# Patient Record
Sex: Female | Born: 1955
Health system: Southern US, Community
[De-identification: ages and names within clinical notes are randomized; demographics above are authoritative.]

## PROBLEM LIST (undated history)

## (undated) DIAGNOSIS — D649 Anemia, unspecified: Secondary | ICD-10-CM

## (undated) DIAGNOSIS — Z9889 Other specified postprocedural states: Secondary | ICD-10-CM

## (undated) DIAGNOSIS — F1021 Alcohol dependence, in remission: Secondary | ICD-10-CM

## (undated) DIAGNOSIS — F32A Depression, unspecified: Secondary | ICD-10-CM

## (undated) DIAGNOSIS — F909 Attention-deficit hyperactivity disorder, unspecified type: Secondary | ICD-10-CM

## (undated) DIAGNOSIS — F419 Anxiety disorder, unspecified: Secondary | ICD-10-CM

## (undated) DIAGNOSIS — R011 Cardiac murmur, unspecified: Secondary | ICD-10-CM

## (undated) DIAGNOSIS — R112 Nausea with vomiting, unspecified: Secondary | ICD-10-CM

## (undated) DIAGNOSIS — M1990 Unspecified osteoarthritis, unspecified site: Secondary | ICD-10-CM

## (undated) DIAGNOSIS — F329 Major depressive disorder, single episode, unspecified: Secondary | ICD-10-CM

## (undated) HISTORY — PX: CHOLECYSTECTOMY: SHX55

---

## 2002-07-29 ENCOUNTER — Ambulatory Visit (HOSPITAL_COMMUNITY): Admission: RE | Admit: 2002-07-29 | Discharge: 2002-07-29 | Payer: Self-pay | Admitting: Internal Medicine

## 2002-07-29 ENCOUNTER — Encounter: Payer: Self-pay | Admitting: Internal Medicine

## 2008-07-26 ENCOUNTER — Emergency Department (HOSPITAL_COMMUNITY): Admission: EM | Admit: 2008-07-26 | Discharge: 2008-07-26 | Payer: Self-pay | Admitting: Family Medicine

## 2008-09-23 ENCOUNTER — Other Ambulatory Visit: Admission: RE | Admit: 2008-09-23 | Discharge: 2008-09-23 | Payer: Self-pay | Admitting: Family Medicine

## 2010-02-02 ENCOUNTER — Emergency Department (HOSPITAL_COMMUNITY): Admission: EM | Admit: 2010-02-02 | Discharge: 2010-02-02 | Payer: Self-pay | Admitting: Emergency Medicine

## 2010-02-17 ENCOUNTER — Emergency Department (HOSPITAL_COMMUNITY): Admission: EM | Admit: 2010-02-17 | Discharge: 2010-02-17 | Payer: Self-pay | Admitting: Emergency Medicine

## 2012-10-23 ENCOUNTER — Emergency Department (HOSPITAL_COMMUNITY): Payer: 59

## 2012-10-23 ENCOUNTER — Encounter (HOSPITAL_COMMUNITY): Payer: Self-pay | Admitting: Emergency Medicine

## 2012-10-23 ENCOUNTER — Inpatient Hospital Stay (HOSPITAL_COMMUNITY)
Admission: EM | Admit: 2012-10-23 | Discharge: 2012-10-26 | DRG: 812 | Disposition: A | Discharge status: Home / Self Care | Payer: 59 | Attending: Internal Medicine | Admitting: Internal Medicine

## 2012-10-23 DIAGNOSIS — D509 Iron deficiency anemia, unspecified: Principal | ICD-10-CM

## 2012-10-23 DIAGNOSIS — F329 Major depressive disorder, single episode, unspecified: Secondary | ICD-10-CM

## 2012-10-23 DIAGNOSIS — R600 Localized edema: Secondary | ICD-10-CM

## 2012-10-23 DIAGNOSIS — F1020 Alcohol dependence, uncomplicated: Secondary | ICD-10-CM

## 2012-10-23 DIAGNOSIS — Z720 Tobacco use: Secondary | ICD-10-CM

## 2012-10-23 DIAGNOSIS — D62 Acute posthemorrhagic anemia: Secondary | ICD-10-CM

## 2012-10-23 DIAGNOSIS — D649 Anemia, unspecified: Secondary | ICD-10-CM

## 2012-10-23 DIAGNOSIS — F988 Other specified behavioral and emotional disorders with onset usually occurring in childhood and adolescence: Secondary | ICD-10-CM

## 2012-10-23 DIAGNOSIS — F3289 Other specified depressive episodes: Secondary | ICD-10-CM

## 2012-10-23 DIAGNOSIS — F17200 Nicotine dependence, unspecified, uncomplicated: Secondary | ICD-10-CM | POA: Diagnosis present

## 2012-10-23 DIAGNOSIS — F32A Depression, unspecified: Secondary | ICD-10-CM

## 2012-10-23 HISTORY — DX: Other specified postprocedural states: Z98.890

## 2012-10-23 HISTORY — DX: Other specified postprocedural states: R11.2

## 2012-10-23 HISTORY — DX: Depression, unspecified: F32.A

## 2012-10-23 HISTORY — DX: Major depressive disorder, single episode, unspecified: F32.9

## 2012-10-23 HISTORY — DX: Anemia, unspecified: D64.9

## 2012-10-23 HISTORY — DX: Cardiac murmur, unspecified: R01.1

## 2012-10-23 LAB — CBC WITH DIFFERENTIAL/PLATELET
Basophils Relative: 1 % (ref 0–1)
Eosinophils Absolute: 0 10*3/uL (ref 0.0–0.7)
Eosinophils Relative: 1 % (ref 0–5)
HCT: 19.1 % — ABNORMAL LOW (ref 36.0–46.0)
Hemoglobin: 4.8 g/dL — CL (ref 12.0–15.0)
Lymphocytes Relative: 30 % (ref 12–46)
MCHC: 25.1 g/dL — ABNORMAL LOW (ref 30.0–36.0)
Neutro Abs: 2.5 10*3/uL (ref 1.7–7.7)

## 2012-10-23 LAB — COMPREHENSIVE METABOLIC PANEL
ALT: 13 U/L (ref 0–35)
Alkaline Phosphatase: 50 U/L (ref 39–117)
CO2: 23 mEq/L (ref 19–32)
Calcium: 9.3 mg/dL (ref 8.4–10.5)
Chloride: 104 mEq/L (ref 96–112)
GFR calc Af Amer: >90 mL/min (ref >90)
GFR calc non Af Amer: 81 mL/min — ABNORMAL LOW (ref >90)
Glucose, Bld: 112 mg/dL — ABNORMAL HIGH (ref 70–99)
Potassium: 3.7 mEq/L (ref 3.5–5.1)
Sodium: 139 mEq/L (ref 135–145)
Total Bilirubin: 0.4 mg/dL (ref 0.3–1.2)

## 2012-10-23 LAB — PROTIME-INR
INR: 1.25 (ref 0.00–1.49)
Prothrombin Time: 15.5 seconds — ABNORMAL HIGH (ref 11.6–15.2)

## 2012-10-23 LAB — POCT I-STAT TROPONIN I: Troponin i, poc: 0.01 ng/mL (ref 0.00–0.08)

## 2012-10-23 MED ORDER — DIPHENHYDRAMINE HCL 50 MG/ML IJ SOLN
25.0000 mg | Freq: Once | INTRAMUSCULAR | Status: AC
  Administered 2012-10-23: 25 mg via INTRAVENOUS
  Filled 2012-10-23: qty 1

## 2012-10-23 MED ORDER — PANTOPRAZOLE SODIUM 40 MG IV SOLR
40.0000 mg | Freq: Once | INTRAVENOUS | Status: AC
  Administered 2012-10-23: 40 mg via INTRAVENOUS
  Filled 2012-10-23: qty 40

## 2012-10-23 MED ORDER — SODIUM CHLORIDE 0.9 % IV BOLUS (SEPSIS)
1000.0000 mL | Freq: Once | INTRAVENOUS | Status: AC
  Administered 2012-10-23: 1000 mL via INTRAVENOUS

## 2012-10-23 NOTE — ED Notes (Signed)
Pt saw PCP on Monday for fatigue, SOB, and chest pressure x several weeks; pt to have cath and had pre op blood work done and called to come here for hgb 5; pt sts SOB with exertion and fatigue with chest pressure and swelling in hands and feet

## 2012-10-23 NOTE — ED Notes (Signed)
Consent to receive blood signed by patient, RN, and MD @ [phone].

## 2012-10-23 NOTE — H&P (Addendum)
Laura Chang is an 56 y.o. female.  Patient was seen and examined on 10/23/2012. PCP - [facility]. Chief Complaint: Low hemoglobin. HPI: 56 year-old female with history of depression and ADD who drinks wine every day has been experiencing shortness of breath fatigue over the last few weeks. Patient also been having exertional chest tightness. Patient was referred to cardiologist and cardiologist was planning procedure and had preliminary blood work done and showed severe anemia and was referred to the ER. In the ER repeat blood work confirmed patient's hemoglobin to be around 5 and at this time has been admitted for further workup. Patient denies any blood in the stool or black stools denies any menorrhagia as patient has had menopause 4 years ago. Patient occasionally uses NSAIDs last use was last week. Stool for occult blood was negative as and but ER physician and stool was brown not melanotic. Patient otherwise is hemodynamically stable.  Past Medical History  Diagnosis Date  . Depression     Past Surgical History  Procedure Date  . Cholecystectomy     Family History  Problem Relation Age of Onset  . Atrial fibrillation Mother   . CAD Father    Social History:  reports that she has been smoking.  She does not have any smokeless tobacco history on file. She reports that she drinks alcohol. She reports that she does not use illicit drugs.  Allergies: No Known Allergies   (Not in a hospital admission)  Results for orders placed during the hospital encounter of 10/23/12 (from the past 48 hour(s))  CBC WITH DIFFERENTIAL     Status: Abnormal   Collection Time   10/23/12  4:59 PM      Component Value Range Comment   WBC 4.3  4.0 - 10.5 K/uL    RBC 3.05 (*) 3.87 - 5.11 MIL/uL    Hemoglobin 4.8 (*) 12.0 - 15.0 g/dL    HCT 16.1 (*) 09.6 - 46.0 %    MCV 62.6 (*) 78.0 - 100.0 fL    MCH 15.7 (*) 26.0 - 34.0 pg    MCHC 25.1 (*) 30.0 - 36.0 g/dL    RDW 04.5 (*) 40.9 - 15.5  %    Platelets 411 (*) 150 - 400 K/uL    Neutrophils Relative 57  43 - 77 %    Lymphocytes Relative 30  12 - 46 %    Monocytes Relative 11  3 - 12 %    Eosinophils Relative 1  0 - 5 %    Basophils Relative 1  0 - 1 %    Neutro Abs 2.5  1.7 - 7.7 K/uL    Lymphs Abs 1.3  0.7 - 4.0 K/uL    Monocytes Absolute 0.5  0.1 - 1.0 K/uL    Eosinophils Absolute 0.0  0.0 - 0.7 K/uL    Basophils Absolute 0.0  0.0 - 0.1 K/uL    RBC Morphology POLYCHROMASIA PRESENT   ELLIPTOCYTES   Smear Review LARGE PLATELETS PRESENT     COMPREHENSIVE METABOLIC PANEL     Status: Abnormal   Collection Time   10/23/12  4:59 PM      Component Value Range Comment   Sodium 139  135 - 145 mEq/L    Potassium 3.7  3.5 - 5.1 mEq/L    Chloride 104  96 - 112 mEq/L    CO2 23  19 - 32 mEq/L    Glucose, Bld 112 (*) 70 - 99 mg/dL  BUN 17  6 - 23 mg/dL    Creatinine, Ser 4.09  0.50 - 1.10 mg/dL    Calcium 9.3  8.4 - 81.1 mg/dL    Total Protein 6.8  6.0 - 8.3 g/dL    Albumin 3.9  3.5 - 5.2 g/dL    AST 15  0 - 37 U/L    ALT 13  0 - 35 U/L    Alkaline Phosphatase 50  39 - 117 U/L    Total Bilirubin 0.4  0.3 - 1.2 mg/dL    GFR calc non Af Amer 81 (*) >90 mL/min    GFR calc Af Amer >90  >90 mL/min   PROTIME-INR     Status: Abnormal   Collection Time   10/23/12  4:59 PM      Component Value Range Comment   Prothrombin Time 15.5 (*) 11.6 - 15.2 seconds    INR 1.25  0.00 - 1.49   PRO B NATRIURETIC PEPTIDE     Status: Abnormal   Collection Time   10/23/12  5:08 PM      Component Value Range Comment   Pro B Natriuretic peptide (BNP) 248.6 (*) 0 - 125 pg/mL   TYPE AND SCREEN     Status: Normal (Preliminary result)   Collection Time   10/23/12  5:15 PM      Component Value Range Comment   ABO/RH(D) A POS      Antibody Screen NEG      Sample Expiration 10/26/2012      Unit Number [id number]      Blood Component Type RBC LR PHER1      Unit division 00      Status of Unit ISSUED      Transfusion Status OK TO TRANSFUSE       Crossmatch Result Compatible      Unit Number [id number]      Blood Component Type RED CELLS,LR      Unit division 00      Status of Unit ALLOCATED      Transfusion Status OK TO TRANSFUSE      Crossmatch Result Compatible     ABO/RH     Status: Normal   Collection Time   10/23/12  5:15 PM      Component Value Range Comment   ABO/RH(D) A POS     POCT I-STAT TROPONIN I     Status: Normal   Collection Time   10/23/12  5:24 PM      Component Value Range Comment   Troponin i, poc 0.01  0.00 - 0.08 ng/mL    Comment 3            PREPARE RBC (CROSSMATCH)     Status: Normal   Collection Time   10/23/12  7:00 PM      Component Value Range Comment   Order Confirmation ORDER PROCESSED BY BLOOD BANK      Dg Chest 2 View  10/23/2012  *RADIOLOGY REPORT*  Clinical Data: Chest pressure.  Cough.  CHEST - 2 VIEW  Comparison: None.  Findings: Normal sized heart.  Clear lungs.  Moderate-sized hiatal hernia.  Cholecystectomy clips.  Mild thoracic spine degenerative changes.  IMPRESSION: No acute abnormality.  Moderate-sized hiatal hernia.   Original Report Authenticated By: Beckie Salts, M.D.     Review of Systems  Constitutional: Negative.   HENT: Negative.   Eyes: Negative.   Respiratory: Positive for shortness of breath.   Cardiovascular:  Chest tightness.  Gastrointestinal: Negative.   Genitourinary: Negative.   Musculoskeletal:       Lower extremity edema.  Skin: Negative.   Neurological: Negative.   Endo/Heme/Allergies: Negative.   Psychiatric/Behavioral: Negative.     Blood pressure 148/80, pulse 87, temperature 98.2 F (36.8 C), temperature source Oral, resp. rate 25, height 5' 5.5" (1.664 m), weight 79.379 kg (175 lb), SpO2 99.00%. Physical Exam  Constitutional: She is oriented to person, place, and time. She appears well-developed.  HENT:  Head: Normocephalic and atraumatic.  Right Ear: External ear normal.  Left Ear: External ear normal.  Nose: Nose normal.   Eyes: Pupils are equal, round, and reactive to light.       Pallor.  Neck: Normal range of motion. Neck supple.  Cardiovascular: Normal rate and regular rhythm.   Respiratory: Effort normal and breath sounds normal. No respiratory distress. She has no wheezes. She has no rales.  GI: Soft. Bowel sounds are normal. She exhibits no distension. There is no tenderness. There is no rebound.  Musculoskeletal: She exhibits edema. She exhibits no tenderness.  Neurological: She is alert and oriented to person, place, and time.  Skin: Skin is warm and dry.     Assessment/Plan #1. Symptomatic severe anemia - most likely cause could be GI bleed. At this time patient has been kept n.p.o. Protonix IV. Transfuse 2 units of packed or blood cells recheck CBC in a.m. May need more. Consult GI in a.m. #2. Lower extremity edema - patient has been noticing lower extremity now last few days. Will order Dopplers. Will eventually need further workup including 2-D echo. #3. Alcoholism - patient has been placed on CIWA protocol. #4. Tobacco abuse - advised to quit smoking. #5. History of depression and ADD - continue present medications.  CODE STATUS - full code.  Eduard Clos. 10/23/2012, 9:11 PM

## 2012-10-23 NOTE — ED Notes (Signed)
The patient c/o mild throat itching right as her first unit was about to finish.  There is no angioedema or signs of serious distress.  The patient was given Benadryl 25 mL, iv shortly after the complaint was noted, and she was advised to warn the staff of worsening symptoms.

## 2012-10-23 NOTE — ED Provider Notes (Signed)
History     CSN: [id number]  Arrival date & time 10/23/12  1639   First MD Initiated Contact with Patient 10/23/12 1705      Chief Complaint  Patient presents with  . Anemia  . Chest Pain  . Shortness of Breath    (Consider location/radiation/quality/duration/timing/severity/associated sxs/prior treatment) HPI Comments: Pt with no significant medical hx, hx of heavy alcohol drinking until recently, comes in with cc of sob and chest pain with low Hb. Pt reports that for the past few months she has been having progressive sob and chest tightness - and in fact is scheduled for heart catheterization for that reason. she has some pre-op workup, and was found to be significantly anemic, and so she was requested to come to the ER At rest patient has no symptoms. She has no hx of vaginal bleeding or GI bleeding. No hx of PUD, and no heavy nsaid use.  Patient is a 56 y.o. female presenting with anemia, chest pain, and shortness of breath. The history is provided by the patient.  Anemia Associated symptoms include chest pain and shortness of breath. Pertinent negatives include no abdominal pain.  Chest Pain Primary symptoms include shortness of breath and dizziness. Pertinent negatives for primary symptoms include no cough, no wheezing, no abdominal pain, no nausea and no vomiting.  Dizziness also occurs with weakness. Dizziness does not occur with nausea or vomiting.  Associated symptoms include weakness.    Shortness of Breath  Associated symptoms include chest pain and shortness of breath. Pertinent negatives include no cough and no wheezing.    Past Medical History  Diagnosis Date  . Depression     History reviewed. No pertinent past surgical history.  History reviewed. No pertinent family history.  History  Substance Use Topics  . Smoking status: Former Games developer  . Smokeless tobacco: Not on file  . Alcohol Use: Yes    OB History    Grav Para Term Preterm Abortions TAB  SAB Ect Mult Living                  Review of Systems  Constitutional: Negative for activity change.  HENT: Negative for facial swelling and neck pain.   Respiratory: Positive for chest tightness and shortness of breath. Negative for cough and wheezing.   Cardiovascular: Positive for chest pain.  Gastrointestinal: Negative for nausea, vomiting, abdominal pain, diarrhea, constipation, blood in stool and abdominal distention.  Genitourinary: Negative for hematuria and difficulty urinating.  Skin: Negative for color change.  Neurological: Positive for dizziness, weakness and light-headedness. Negative for speech difficulty.  Hematological: Does not bruise/bleed easily.  Psychiatric/Behavioral: Negative for confusion.    Allergies  Review of patient's allergies indicates no known allergies.  Home Medications   Current Outpatient Rx  Name  Route  Sig  Dispense  Refill  . AMPHETAMINE-DEXTROAMPHETAMINE 20 MG PO TABS   Oral   Take 20 mg by mouth 2 (two) times daily as needed. For ADD         . VILAZODONE HCL 40 MG PO TABS   Oral   Take 40 mg by mouth daily.           BP 148/80  Pulse 87  Temp 98.2 F (36.8 C) (Oral)  Resp 25  SpO2 99%  Physical Exam  Nursing note and vitals reviewed. Constitutional: She is oriented to person, place, and time. She appears well-developed.  HENT:  Head: Normocephalic and atraumatic.  Eyes: Conjunctivae normal and EOM are normal.  Pupils are equal, round, and reactive to light.  Neck: Normal range of motion. Neck supple.  Cardiovascular: Regular rhythm.   Murmur heard. Pulmonary/Chest: Effort normal and breath sounds normal. No respiratory distress.  Abdominal: Soft. Bowel sounds are normal. She exhibits no distension. There is no tenderness. There is no rebound and no guarding.       Digital rectal exam reveals dark stooles  -guaic negative.  Neurological: She is alert and oriented to person, place, and time.  Skin: Skin is warm and  dry.    ED Course  Procedures (including critical care time)  Labs Reviewed  CBC WITH DIFFERENTIAL - Abnormal; Notable for the following:    RBC 3.05 (*)     Hemoglobin 4.8 (*)     HCT 19.1 (*)     MCV 62.6 (*)     MCH 15.7 (*)     MCHC 25.1 (*)     RDW 19.2 (*)     Platelets 411 (*)     All other components within normal limits  COMPREHENSIVE METABOLIC PANEL - Abnormal; Notable for the following:    Glucose, Bld 112 (*)     GFR calc non Af Amer 81 (*)     All other components within normal limits  PROTIME-INR - Abnormal; Notable for the following:    Prothrombin Time 15.5 (*)     All other components within normal limits  PRO B NATRIURETIC PEPTIDE - Abnormal; Notable for the following:    Pro B Natriuretic peptide (BNP) 248.6 (*)     All other components within normal limits  TYPE AND SCREEN  POCT I-STAT TROPONIN I  ABO/RH  PREPARE RBC (CROSSMATCH)   Dg Chest 2 View  10/23/2012  *RADIOLOGY REPORT*  Clinical Data: Chest pressure.  Cough.  CHEST - 2 VIEW  Comparison: None.  Findings: Normal sized heart.  Clear lungs.  Moderate-sized hiatal hernia.  Cholecystectomy clips.  Mild thoracic spine degenerative changes.  IMPRESSION: No acute abnormality.  Moderate-sized hiatal hernia.   Original Report Authenticated By: Beckie Salts, M.D.      No diagnosis found.    MDM  DDx includes: Esophagitis Mallory Weiss tear [place]  Variceal bleeding PUD/Gastritis/ulcers Diverticular bleed Colon cancer Rectal bleed Internal hemorrhoids External hemorrhoids  Pt comes in with cc of acute anemia. She is symptomatic - as there is some chest pain and sob with exertion. Will start transfusion in the ER. Her rectal exam reveals dark stools, but it is guaic negative. Will get basic labs, will need to be admitted for acute anemia workup.   CRITICAL CARE Performed by: Derwood Kaplan   Total critical care time: 30 minutes  Critical care time was exclusive of separately  billable procedures and treating other patients.  Critical care was necessary to treat or prevent imminent or life-threatening deterioration.  Critical care was time spent personally by me on the following activities: development of treatment plan with patient and/or surrogate as well as nursing, discussions with consultants, evaluation of patient's response to treatment, examination of patient, obtaining history from patient or surrogate, ordering and performing treatments and interventions, ordering and review of laboratory studies, ordering and review of radiographic studies, pulse oximetry and re-evaluation of patient's condition.   Derwood Kaplan, MD 10/23/12 2057

## 2012-10-23 NOTE — ED Notes (Signed)
JL acknowledged EKG order but was performed in triage.

## 2012-10-23 NOTE — ED Notes (Signed)
Lab called Hemoglobin 4.8 reported to EDP

## 2012-10-24 ENCOUNTER — Other Ambulatory Visit: Payer: Self-pay | Admitting: Interventional Cardiology

## 2012-10-24 ENCOUNTER — Encounter (HOSPITAL_COMMUNITY): Payer: Self-pay | Admitting: *Deleted

## 2012-10-24 ENCOUNTER — Encounter (HOSPITAL_COMMUNITY)
Admission: EM | Disposition: A | Discharge status: Home / Self Care | Payer: Self-pay | Source: Home / Self Care | Attending: Internal Medicine

## 2012-10-24 DIAGNOSIS — F17200 Nicotine dependence, unspecified, uncomplicated: Secondary | ICD-10-CM

## 2012-10-24 HISTORY — PX: ESOPHAGOGASTRODUODENOSCOPY: SHX5428

## 2012-10-24 LAB — COMPREHENSIVE METABOLIC PANEL
AST: 16 U/L (ref 0–37)
Albumin: 3.5 g/dL (ref 3.5–5.2)
Alkaline Phosphatase: 43 U/L (ref 39–117)
BUN: 13 mg/dL (ref 6–23)
CO2: 20 mEq/L (ref 19–32)
Chloride: 109 mEq/L (ref 96–112)
Creatinine, Ser: 0.8 mg/dL (ref 0.50–1.10)
GFR calc non Af Amer: 81 mL/min — ABNORMAL LOW (ref >90)
Potassium: 3.6 mEq/L (ref 3.5–5.1)
Total Bilirubin: 1.3 mg/dL — ABNORMAL HIGH (ref 0.3–1.2)

## 2012-10-24 LAB — CBC
HCT: 20.2 % — ABNORMAL LOW (ref 36.0–46.0)
MCV: 66 fL — ABNORMAL LOW (ref 78.0–100.0)
RBC: 3.06 MIL/uL — ABNORMAL LOW (ref 3.87–5.11)
RDW: 19.9 % — ABNORMAL HIGH (ref 11.5–15.5)
WBC: 3.6 10*3/uL — ABNORMAL LOW (ref 4.0–10.5)

## 2012-10-24 LAB — IRON AND TIBC
Iron: 343 ug/dL — ABNORMAL HIGH (ref 42–135)
Saturation Ratios: 49 % (ref 20–55)
UIBC: 363 ug/dL (ref 125–400)

## 2012-10-24 LAB — PREPARE RBC (CROSSMATCH)

## 2012-10-24 LAB — FOLATE: Folate: >20 ng/mL

## 2012-10-24 LAB — TSH: TSH: 1.512 u[IU]/mL (ref 0.350–4.500)

## 2012-10-24 LAB — GLUCOSE, CAPILLARY: Glucose-Capillary: 63 mg/dL — ABNORMAL LOW (ref 70–99)

## 2012-10-24 LAB — FERRITIN: Ferritin: 5 ng/mL — ABNORMAL LOW (ref 10–291)

## 2012-10-24 SURGERY — EGD (ESOPHAGOGASTRODUODENOSCOPY)
Anesthesia: Moderate Sedation

## 2012-10-24 MED ORDER — FOLIC ACID 1 MG PO TABS
1.0000 mg | ORAL_TABLET | Freq: Every day | ORAL | Status: DC
  Administered 2012-10-24 – 2012-10-26 (×3): 1 mg via ORAL
  Filled 2012-10-24 (×3): qty 1

## 2012-10-24 MED ORDER — SODIUM CHLORIDE 0.9 % IV SOLN
INTRAVENOUS | Status: DC
  Administered 2012-10-24: 01:00:00 via INTRAVENOUS

## 2012-10-24 MED ORDER — ACETAMINOPHEN 325 MG PO TABS
650.0000 mg | ORAL_TABLET | Freq: Four times a day (QID) | ORAL | Status: DC | PRN
  Administered 2012-10-25 – 2012-10-26 (×3): 650 mg via ORAL
  Filled 2012-10-24: qty 1
  Filled 2012-10-24 (×3): qty 2

## 2012-10-24 MED ORDER — THIAMINE HCL 100 MG/ML IJ SOLN
100.0000 mg | Freq: Every day | INTRAMUSCULAR | Status: DC
  Filled 2012-10-24 (×3): qty 1

## 2012-10-24 MED ORDER — FENTANYL CITRATE 0.05 MG/ML IJ SOLN
INTRAMUSCULAR | Status: DC | PRN
  Administered 2012-10-24 (×3): 25 ug via INTRAVENOUS

## 2012-10-24 MED ORDER — SODIUM CHLORIDE 0.9 % IV SOLN
INTRAVENOUS | Status: DC
  Administered 2012-10-24: 11:00:00 via INTRAVENOUS

## 2012-10-24 MED ORDER — VITAMIN B-1 100 MG PO TABS
100.0000 mg | ORAL_TABLET | Freq: Every day | ORAL | Status: DC
  Administered 2012-10-24 – 2012-10-26 (×3): 100 mg via ORAL
  Filled 2012-10-24 (×3): qty 1

## 2012-10-24 MED ORDER — LORAZEPAM 2 MG/ML IJ SOLN: 1.0000 mg | Freq: Four times a day (QID) | INTRAMUSCULAR | Status: DC | PRN

## 2012-10-24 MED ORDER — DIPHENHYDRAMINE HCL 50 MG/ML IJ SOLN
INTRAMUSCULAR | Status: AC
  Filled 2012-10-24: qty 1

## 2012-10-24 MED ORDER — DIPHENHYDRAMINE HCL 25 MG PO CAPS
25.0000 mg | ORAL_CAPSULE | Freq: Once | ORAL | Status: AC
  Administered 2012-10-24: 25 mg via ORAL
  Filled 2012-10-24: qty 1

## 2012-10-24 MED ORDER — SODIUM CHLORIDE 0.9 % IV SOLN: INTRAVENOUS | Status: DC

## 2012-10-24 MED ORDER — MIDAZOLAM HCL 5 MG/ML IJ SOLN
INTRAMUSCULAR | Status: AC
  Filled 2012-10-24: qty 2

## 2012-10-24 MED ORDER — PEG 3350-KCL-NA BICARB-NACL 420 G PO SOLR
4000.0000 mL | Freq: Once | ORAL | Status: AC
  Administered 2012-10-24: 4000 mL via ORAL
  Filled 2012-10-24: qty 4000

## 2012-10-24 MED ORDER — INFLUENZA VIRUS VACC SPLIT PF IM SUSP
0.5000 mL | INTRAMUSCULAR | Status: AC
  Administered 2012-10-25: 0.5 mL via INTRAMUSCULAR
  Filled 2012-10-24: qty 0.5

## 2012-10-24 MED ORDER — BUTAMBEN-TETRACAINE-BENZOCAINE 2-2-14 % EX AERO
INHALATION_SPRAY | CUTANEOUS | Status: DC | PRN
  Administered 2012-10-24: 2 via TOPICAL

## 2012-10-24 MED ORDER — ADULT MULTIVITAMIN W/MINERALS CH
1.0000 | ORAL_TABLET | Freq: Every day | ORAL | Status: DC
  Administered 2012-10-24 – 2012-10-26 (×3): 1 via ORAL
  Filled 2012-10-24 (×3): qty 1

## 2012-10-24 MED ORDER — PANTOPRAZOLE SODIUM 40 MG IV SOLR
40.0000 mg | Freq: Two times a day (BID) | INTRAVENOUS | Status: DC
  Administered 2012-10-24 – 2012-10-26 (×6): 40 mg via INTRAVENOUS
  Filled 2012-10-24 (×7): qty 40

## 2012-10-24 MED ORDER — ACETAMINOPHEN 650 MG RE SUPP: 650.0000 mg | Freq: Four times a day (QID) | RECTAL | Status: DC | PRN

## 2012-10-24 MED ORDER — ONDANSETRON HCL 4 MG/2ML IJ SOLN: 4.0000 mg | Freq: Four times a day (QID) | INTRAMUSCULAR | Status: DC | PRN

## 2012-10-24 MED ORDER — FENTANYL CITRATE 0.05 MG/ML IJ SOLN
INTRAMUSCULAR | Status: AC
  Filled 2012-10-24: qty 2

## 2012-10-24 MED ORDER — SODIUM CHLORIDE 0.9 % IJ SOLN
3.0000 mL | Freq: Two times a day (BID) | INTRAMUSCULAR | Status: DC
  Administered 2012-10-24 – 2012-10-26 (×4): 3 mL via INTRAVENOUS

## 2012-10-24 MED ORDER — LORAZEPAM 1 MG PO TABS
1.0000 mg | ORAL_TABLET | Freq: Four times a day (QID) | ORAL | Status: DC | PRN
  Administered 2012-10-24: 1 mg via ORAL
  Filled 2012-10-24: qty 1

## 2012-10-24 MED ORDER — ONDANSETRON HCL 4 MG PO TABS: 4.0000 mg | ORAL_TABLET | Freq: Four times a day (QID) | ORAL | Status: DC | PRN

## 2012-10-24 MED ORDER — MIDAZOLAM HCL 10 MG/2ML IJ SOLN
INTRAMUSCULAR | Status: DC | PRN
  Administered 2012-10-24 (×5): 2 mg via INTRAVENOUS

## 2012-10-24 MED ORDER — LORAZEPAM 2 MG/ML IJ SOLN: 0.0000 mg | Freq: Two times a day (BID) | INTRAMUSCULAR | Status: DC

## 2012-10-24 MED ORDER — LORAZEPAM 2 MG/ML IJ SOLN: 0.0000 mg | Freq: Four times a day (QID) | INTRAMUSCULAR | Status: AC

## 2012-10-24 MED ORDER — ACETAMINOPHEN 325 MG PO TABS
650.0000 mg | ORAL_TABLET | Freq: Once | ORAL | Status: AC
  Administered 2012-10-24: 650 mg via ORAL
  Filled 2012-10-24: qty 2

## 2012-10-24 NOTE — Interval H&P Note (Signed)
History and Physical Interval Note:  10/24/2012 11:20 AM  Laura Chang  has presented today for surgery, with the diagnosis of GI bleed  The various methods of treatment have been discussed with the patient and family. After consideration of risks, benefits and other options for treatment, the patient has consented to  Procedure(s) (LRB) with comments: ESOPHAGOGASTRODUODENOSCOPY (EGD) (N/A) as a surgical intervention .  The patient's history has been reviewed, patient examined, no change in status, stable for surgery.  I have reviewed the patient's chart and labs.  Questions were answered to the patient's satisfaction.     Aluna Whiston JR,Muhamad Serano L

## 2012-10-24 NOTE — Progress Notes (Signed)
Pt alert and oriented and ambulates independently. VSS. 2nd unit of blood infusing at 150cc/hr. Visiting guidelines, safety plan, and call bell discussed. Ordered carried out.

## 2012-10-24 NOTE — Op Note (Signed)
[facility] [address] [place], [address]   ENDOSCOPY PROCEDURE REPORT  PATIENT: Laura Chang, Laura Chang  MR#: [id number] BIRTHDATE: Jan 10, 1956 , 56  yrs. old GENDER: Female ENDOSCOPIST:Alexzandra Bilton Randa Evens, MD REFERRED BY:  [facility]. PCP: Dr. Jillyn Hidden PROCEDURE DATE:  10/24/2012 PROCEDURE:      EGD and Biopsy ASA CLASS:  class I INDICATIONS:   profound iron deficiency anemia with hemoglobin of 4.8 with no active GI bleeding. MEDICATION:    fentanyl 75 mcg, Versed 10 mg IV TOPICAL ANESTHETIC:    Cetacaine spray  DESCRIPTION OF PROCEDURE:   The procedure had been explained to the patient and consent obtained. We have discussed the need for colonoscopy if no active bleeding site was found. The Pentax endoscope was inserted blindly into the esophagus with swallowing. The patient did have a relatively active gag reflex. At the GE junction and ulcerated nodule was seen. This was not actively bleeding and did not have the appearance of varices. There were no obvious varices and no other gross esophagitis. The GE junction was widely patent. The patient had a very large hiatal hernia. Due to her gagging it was difficult to measure the size was estimated to be 8-10 cm in length. No gross Cameron ulcers were seen. There was no active bleeding in the esophagus or in the stomach. The antrum and duodenuml were normal. The duodenum and duodenal bulb were normal. The scope was with drawn. The initial findings were confirmed. Again, there was no signs of active or recent bleeding throughout the entire upper GI tract. the second duodenum was empirically biopsy to evaluate for celiac disease. It had an endoscopic appearance it was normal.     COMPLICATIONS: None  ENDOSCOPIC IMPRESSION: 1. Small ulcerated nodular area at the GE junction. This is probably inflammation due to reflux. It does not have the appearance of a varix and there is no other signs of varices. The  GE junction was widely patent the patient freely refluxed during the exam. 2. Large hiatal hernia probably contributing to her esophageal reflux. 3. Profound iron deficiency anemia. No obvious source found on upper GI endoscopy. There is no active bleeding. Small bowel has been biopsied to evaluate for celiac disease.  RECOMMENDATIONS: 1. Agree with transfusion and we'll go ahead and check an iron panel. 2. We'll plan on colonoscopy tomorrow morning to evaluate the colon 3. Would treat aggressively for esophageal reflux with chronic PPI therapy.    _______________________________ Rosalie DoctorCarman Ching, MD 10/24/2012 12:04 PM  CC: Dr. Jillyn Hidden    PATIENT NAME:  Laura Chang, Laura Chang. MR#: [id number]

## 2012-10-24 NOTE — Progress Notes (Signed)
Pt blood administration of 1 unit of PRBC was finished in endoscopy at 1200. documented on slip. Endo put vitals in EPIC. Peter Congo  RN

## 2012-10-24 NOTE — Progress Notes (Signed)
[facility] PROGRESS NOTE  Laura Chang ZDG:[id number] DOB: 14-Sep-1956 DOA: 10/23/2012 PCP: No primary provider on file.  HPI/Subjective: Feels much better, denies any shortness of breath.   Assessment/Plan:  Anemia, profound -Microcytic anemia with MCV of 66, consistent with iron deficiency anemia.  -She might need IV iron before discharge. -Patient denies any melanotic stools, hematemesis or any previous bleeding. She is postmenopausal. -GI to evaluate, EGD to be done today, colonoscopy can be considered based on EGDs results. -Transfusion of 4 units of packed RBCs. Post transfusion H&H still pending. -Patient is on twice a day Protonix, she is n.p.o. for EGD.  Alcohol abuse -We'll watch for withdrawal, CIWA protocol will be applied if has any withdrawal symptoms.  Tobacco use -Patient counseled extensively.  Code Status: Full code Family Communication: Spoke to the patient Disposition Plan: Remain inpatient   Consultants:  [facility] gastro[facility]  Procedures:  EGD to be done today.  Antibiotics:  None   Objective: Filed Vitals:   10/24/12 1015 10/24/12 1104 10/24/12 1115 10/24/12 1125  BP: 135/80 144/87 147/85 147/82  Pulse: 70     Temp: 98.6 F (37 C) 99 F (37.2 C) 98.9 F (37.2 C)   TempSrc: Oral Oral Oral   Resp: 16 21 17 17   Height:      Weight:      SpO2:  99%  100%    Intake/Output Summary (Last 24 hours) at 10/24/12 1132 Last data filed at 10/24/12 0900  Gross per 24 hour  Intake     25 ml  Output      0 ml  Net     25 ml   Filed Weights   10/23/12 1945 10/24/12 0100  Weight: 79.379 kg (175 lb) 79.379 kg (175 lb)    Exam:  General: Alert and awake, oriented x3, not in any acute distress. HEENT: anicteric sclera, pupils reactive to light and accommodation, EOMI CVS: S1-S2 clear, no murmur rubs or gallops Chest: clear to auscultation bilaterally, no wheezing, rales or rhonchi Abdomen: soft nontender, nondistended,  normal bowel sounds, no organomegaly Extremities: no cyanosis, clubbing or edema noted bilaterally Neuro: Cranial nerves II-XII intact, no focal neurological deficits  Data Reviewed: Basic Metabolic Panel:  Lab 10/24/12 9518 10/23/12 1659  NA 142 139  K 3.6 3.7  CL 109 104  CO2 20 23  GLUCOSE 97 112*  BUN 13 17  CREATININE 0.80 0.80  CALCIUM 8.7 9.3  MG -- --  PHOS -- --   Liver Function Tests:  Lab 10/24/12 0500 10/23/12 1659  AST 16 15  ALT 11 13  ALKPHOS 43 50  BILITOT 1.3* 0.4  PROT 5.9* 6.8  ALBUMIN 3.5 3.9   No results found for this basename: LIPASE:5,AMYLASE:5 in the last 168 hours No results found for this basename: AMMONIA:5 in the last 168 hours CBC:  Lab 10/24/12 0500 10/23/12 1659  WBC 3.6* 4.3  NEUTROABS -- 2.5  HGB 5.6* 4.8*  HCT 20.2* 19.1*  MCV 66.0* 62.6*  PLT 315 411*   Cardiac Enzymes: No results found for this basename: CKTOTAL:5,CKMB:5,CKMBINDEX:5,TROPONINI:5 in the last 168 hours BNP (last 3 results)  Basename 10/23/12 1708  PROBNP 248.6*   CBG:  Lab 10/24/12 0748  GLUCAP 104*    No results found for this or any previous visit (from the past 240 hour(s)).   Studies: Dg Chest 2 View  10/23/2012  *RADIOLOGY REPORT*  Clinical Data: Chest pressure.  Cough.  CHEST - 2 VIEW  Comparison: None.  Findings: Normal sized heart.  Clear lungs.  Moderate-sized hiatal hernia.  Cholecystectomy clips.  Mild thoracic spine degenerative changes.  IMPRESSION: No acute abnormality.  Moderate-sized hiatal hernia.   Original Report Authenticated By: Beckie Salts, M.D.     Scheduled Meds:   . [facility] HOLD] folic acid  1 mg Oral Daily  . [MAR HOLD] influenza  inactive virus vaccine  0.5 mL Intramuscular Tomorrow-1000  . [MAR HOLD] LORazepam  0-4 mg Intravenous Q6H   Followed by  . [MAR HOLD] LORazepam  0-4 mg Intravenous Q12H  . [MAR HOLD] multivitamin with minerals  1 tablet Oral Daily  . [MAR HOLD] pantoprazole (PROTONIX) IV  40 mg Intravenous Q12H   . [MAR HOLD] sodium chloride  3 mL Intravenous Q12H  . [facility] HOLD] thiamine  100 mg Oral Daily   Or  . [facility] HOLD] thiamine  100 mg Intravenous Daily   Continuous Infusions:   . sodium chloride 20 mL/hr at 10/24/12 0100  . sodium chloride 20 mL/hr at 10/24/12 1115    Principal Problem:  *Anemia Active Problems:  Depression  Alcoholism  Lower extremity edema  Tobacco abuse  ADD (attention deficit disorder)    Time spent: 35 minutes    [facility] A  [facility] Pager [phone]. If 8PM-8AM, please contact night-coverage at [email or web address], password [facility] 10/24/2012, 11:32 AM  LOS: 1 day

## 2012-10-24 NOTE — Care Management Note (Unsigned)
    Page 1 of 1   10/24/2012     4:47:27 PM   CARE MANAGEMENT NOTE 10/24/2012  Patient:  Laura Chang,Laura Chang   Account Number:  [id number]  Date Initiated:  10/24/2012  Documentation initiated by:  Letha Cape  Subjective/Objective Assessment:   dx anemia, chest pain  admit- lives with spouse. pta independent.     Action/Plan:   egd 12/13   Anticipated DC Date:  10/25/2012   Anticipated DC Plan:  HOME/SELF CARE      DC Planning Services  CM consult      Choice offered to / List presented to:             Status of service:  In process, will continue to follow Medicare Important Message given?   (If response is "NO", the following Medicare IM given date fields will be blank) Date Medicare IM given:   Date Additional Medicare IM given:    Discharge Disposition:    Per UR Regulation:  Reviewed for med. necessity/level of care/duration of stay  If discussed at Long Length of Stay Meetings, dates discussed:    Comments:  10/24/12 16:46 Letha Cape RN, BSN [phone] patient lives with spouse, pta independent.  NCM will continue to follow for dc needs.

## 2012-10-24 NOTE — Consult Note (Signed)
[facility] CONSULT Reason for consult: Profound anemia Referring Physician: [facility]. PCP: Dr Jillyn Hidden.  Laura Chang is an 56 y.o. female.  HPI: She has been extremely fatigued recently. 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 She had some chest tightness and was referred to cardiologist and was found to have a hemoglobin of 5. She was admitted to the hospital and has undergone transfusion. She has received a transfusion and is now up to a hemoglobin of about 6 and is receiving another unit of blood. She states she R. date feels somewhat better. She adamantly denies melena or hematochezia. She has some intermittent heartburn for which she has taken antacids in the past but doesn't remember taking any in some time. Her only home medications are Adderall and Vibryd which she takes for ADD. She has a history of depression. She adamantly denies taking any NSAIDs. She has had no change in her bowel habits. She has never had endoscopy or colonoscopy. She states that she has never had difficulty with iron in the past. She has had 3 children never had problems with anemia. She is postmenopausal and reports that her last menses was about 5 years ago. She denies any family history of GI cancer or polyps.  Past Medical History  Diagnosis Date  . Depression     Past Surgical History  Procedure Date  . Cholecystectomy     Family History  Problem Relation Age of Onset  . Atrial fibrillation Mother   . CAD Father     Social History:  reports that she has been smoking.  She does not have any smokeless tobacco history on file. She reports that she drinks alcohol. She reports that she does not use illicit drugs.  Allergies: No Known Allergies  Medications;    . folic acid  1 mg Oral Daily  . influenza  inactive virus vaccine  0.5 mL Intramuscular Tomorrow-1000  . LORazepam  0-4 mg Intravenous Q6H    Followed by  . LORazepam  0-4 mg Intravenous Q12H  . multivitamin with minerals  1 tablet Oral Daily  . pantoprazole (PROTONIX) IV  40 mg Intravenous Q12H  . sodium chloride  3 mL Intravenous Q12H  . thiamine  100 mg Oral Daily   Or  . thiamine  100 mg Intravenous Daily   PRN Meds acetaminophen, acetaminophen, LORazepam, LORazepam, ondansetron (ZOFRAN) IV, ondansetron Results for orders placed during the hospital encounter of 10/23/12 (from the past 48 hour(s))  CBC WITH DIFFERENTIAL     Status: Abnormal   Collection Time   10/23/12  4:59 PM      Component Value Range Comment   WBC 4.3  4.0 - 10.5 K/uL    RBC 3.05 (*) 3.87 - 5.11 MIL/uL    Hemoglobin 4.8 (*) 12.0 - 15.0 g/dL    HCT 16.1 (*) 09.6 - 46.0 %    MCV 62.6 (*) 78.0 - 100.0 fL    MCH 15.7 (*) 26.0 - 34.0 pg    MCHC 25.1 (*) 30.0 - 36.0 g/dL    RDW 04.5 (*) 40.9 - 15.5 %    Platelets 411 (*) 150 - 400 K/uL    Neutrophils Relative 57  43 - 77 %    Lymphocytes Relative 30  12 - 46 %    Monocytes Relative 11  3 - 12 %    Eosinophils Relative 1  0 - 5 %  Basophils Relative 1  0 - 1 %    Neutro Abs 2.5  1.7 - 7.7 K/uL    Lymphs Abs 1.3  0.7 - 4.0 K/uL    Monocytes Absolute 0.5  0.1 - 1.0 K/uL    Eosinophils Absolute 0.0  0.0 - 0.7 K/uL    Basophils Absolute 0.0  0.0 - 0.1 K/uL    RBC Morphology POLYCHROMASIA PRESENT   ELLIPTOCYTES   Smear Review LARGE PLATELETS PRESENT     COMPREHENSIVE METABOLIC PANEL     Status: Abnormal   Collection Time   10/23/12  4:59 PM      Component Value Range Comment   Sodium 139  135 - 145 mEq/L    Potassium 3.7  3.5 - 5.1 mEq/L    Chloride 104  96 - 112 mEq/L    CO2 23  19 - 32 mEq/L    Glucose, Bld 112 (*) 70 - 99 mg/dL    BUN 17  6 - 23 mg/dL    Creatinine, Ser 1.61  0.50 - 1.10 mg/dL    Calcium 9.3  8.4 - 09.6 mg/dL    Total Protein 6.8  6.0 - 8.3 g/dL    Albumin 3.9  3.5 - 5.2 g/dL    AST 15  0 - 37 U/L    ALT 13  0 - 35 U/L    Alkaline Phosphatase 50  39 - 117 U/L    Total  Bilirubin 0.4  0.3 - 1.2 mg/dL    GFR calc non Af Amer 81 (*) >90 mL/min    GFR calc Af Amer >90  >90 mL/min   PROTIME-INR     Status: Abnormal   Collection Time   10/23/12  4:59 PM      Component Value Range Comment   Prothrombin Time 15.5 (*) 11.6 - 15.2 seconds    INR 1.25  0.00 - 1.49   PRO B NATRIURETIC PEPTIDE     Status: Abnormal   Collection Time   10/23/12  5:08 PM      Component Value Range Comment   Pro B Natriuretic peptide (BNP) 248.6 (*) 0 - 125 pg/mL   TYPE AND SCREEN     Status: Normal (Preliminary result)   Collection Time   10/23/12  5:15 PM      Component Value Range Comment   ABO/RH(D) A POS      Antibody Screen NEG      Sample Expiration 10/26/2012      Unit Number [id number]      Blood Component Type RBC LR PHER1      Unit division 00      Status of Unit ISSUED,FINAL      Transfusion Status OK TO TRANSFUSE      Crossmatch Result Compatible      Unit Number [id number]      Blood Component Type RED CELLS,LR      Unit division 00      Status of Unit ISSUED,FINAL      Transfusion Status OK TO TRANSFUSE      Crossmatch Result Compatible      Unit Number [id number]      Blood Component Type RED CELLS,LR      Unit division 00      Status of Unit ISSUED      Transfusion Status OK TO TRANSFUSE      Crossmatch Result Compatible      Unit Number [id number]      Blood Component Type  RED CELLS,LR      Unit division 00      Status of Unit ALLOCATED      Transfusion Status OK TO TRANSFUSE      Crossmatch Result Compatible     ABO/RH     Status: Normal   Collection Time   10/23/12  5:15 PM      Component Value Range Comment   ABO/RH(D) A POS     POCT I-STAT TROPONIN I     Status: Normal   Collection Time   10/23/12  5:24 PM      Component Value Range Comment   Troponin i, poc 0.01  0.00 - 0.08 ng/mL    Comment 3            PREPARE RBC (CROSSMATCH)     Status: Normal   Collection Time   10/23/12  7:00 PM      Component Value Range Comment    Order Confirmation ORDER PROCESSED BY BLOOD BANK     RETICULOCYTES     Status: Abnormal   Collection Time   10/23/12  8:55 PM      Component Value Range Comment   Retic Ct Pct 2.2  0.4 - 3.1 %    RBC. 3.03 (*) 3.87 - 5.11 MIL/uL    Retic Count, Manual 66.7  19.0 - 186.0 K/uL   CBC     Status: Abnormal   Collection Time   10/24/12  5:00 AM      Component Value Range Comment   WBC 3.6 (*) 4.0 - 10.5 K/uL    RBC 3.06 (*) 3.87 - 5.11 MIL/uL    Hemoglobin 5.6 (*) 12.0 - 15.0 g/dL CRITICAL VALUE NOTED.  VALUE IS CONSISTENT WITH PREVIOUSLY REPORTED AND CALLED VALUE.   HCT 20.2 (*) 36.0 - 46.0 %    MCV 66.0 (*) 78.0 - 100.0 fL    MCH 18.3 (*) 26.0 - 34.0 pg    MCHC 27.7 (*) 30.0 - 36.0 g/dL    RDW 16.1 (*) 09.6 - 15.5 %    Platelets 315  150 - 400 K/uL DELTA CHECK NOTED  COMPREHENSIVE METABOLIC PANEL     Status: Abnormal   Collection Time   10/24/12  5:00 AM      Component Value Range Comment   Sodium 142  135 - 145 mEq/L    Potassium 3.6  3.5 - 5.1 mEq/L    Chloride 109  96 - 112 mEq/L    CO2 20  19 - 32 mEq/L    Glucose, Bld 97  70 - 99 mg/dL    BUN 13  6 - 23 mg/dL    Creatinine, Ser 0.45  0.50 - 1.10 mg/dL    Calcium 8.7  8.4 - 40.9 mg/dL    Total Protein 5.9 (*) 6.0 - 8.3 g/dL    Albumin 3.5  3.5 - 5.2 g/dL    AST 16  0 - 37 U/L    ALT 11  0 - 35 U/L    Alkaline Phosphatase 43  39 - 117 U/L    Total Bilirubin 1.3 (*) 0.3 - 1.2 mg/dL    GFR calc non Af Amer 81 (*) >90 mL/min    GFR calc Af Amer >90  >90 mL/min   TSH     Status: Normal   Collection Time   10/24/12  5:00 AM      Component Value Range Comment   TSH 1.512  0.350 - 4.500 uIU/mL  PREPARE RBC (CROSSMATCH)     Status: Normal   Collection Time   10/24/12  6:55 AM      Component Value Range Comment   Order Confirmation ORDER PROCESSED BY BLOOD BANK     GLUCOSE, CAPILLARY     Status: Abnormal   Collection Time   10/24/12  7:48 AM      Component Value Range Comment   Glucose-Capillary 104 (*) 70 - 99 mg/dL      Dg Chest 2 View  16/08/9603  *RADIOLOGY REPORT*  Clinical Data: Chest pressure.  Cough.  CHEST - 2 VIEW  Comparison: None.  Findings: Normal sized heart.  Clear lungs.  Moderate-sized hiatal hernia.  Cholecystectomy clips.  Mild thoracic spine degenerative changes.  IMPRESSION: No acute abnormality.  Moderate-sized hiatal hernia.   Original Report Authenticated By: Beckie Salts, M.D.    ROS: Constitutional: Shortness of breath and fatigue for the past few months  HEENT: Negative Cardiovascular: Some recent chest pain with exertion Respiratory: Shortness of breath with exertion recently GI: As above GU: Negative Musculoskeletal: Negative Neuro/Psychiatric: ADD Endocrine/Heme: Negative            Blood pressure 135/77, pulse 68, temperature 98.2 F (36.8 C), temperature source Oral, resp. rate 16, height 5\' 4"  (1.626 m), weight 79.379 kg (175 lb), SpO2 96.00%.  Physical exam:   Gen.-alert oriented white female no acute distress working on the computer Eyes-sclera nonicteric Lungs-clear Heart-regular in rhythm without murmurs or gallops Abdomen-soft and nontender  Assessment: 1. Profound anemia. MCV quite low and this is quite consistent with chronic GI bleeding. Patient has no history of NSAID use is never had colonoscopy. I think quite severe, we will need to evaluate her whole GI tract. 2. Shortness of breath/chest pain. Much better after one unit of blood. Troponins negative. BNP indicates some mild congestive heart failure. She may need a cardiac workup at some point in the future.  Plan: 1. Patient is receiving her second unit of blood now. We'll plan on EGD today and if this is negative I think she should have colonoscopy this admission. If ulcer or upper GI lesion is found it may be done as an outpatient.   Kemesha Mosey JR,Chitara Clonch L 10/24/2012, 10:38 AM

## 2012-10-25 ENCOUNTER — Encounter (HOSPITAL_COMMUNITY)
Admission: EM | Disposition: A | Discharge status: Home / Self Care | Payer: Self-pay | Source: Home / Self Care | Attending: Internal Medicine

## 2012-10-25 ENCOUNTER — Encounter (HOSPITAL_COMMUNITY): Payer: Self-pay | Admitting: Gastroenterology

## 2012-10-25 DIAGNOSIS — D509 Iron deficiency anemia, unspecified: Principal | ICD-10-CM

## 2012-10-25 DIAGNOSIS — R609 Edema, unspecified: Secondary | ICD-10-CM

## 2012-10-25 HISTORY — PX: COLONOSCOPY: SHX5424

## 2012-10-25 LAB — GLUCOSE, CAPILLARY: Glucose-Capillary: 86 mg/dL (ref 70–99)

## 2012-10-25 LAB — TYPE AND SCREEN
Antibody Screen: NEGATIVE
Unit division: 0
Unit division: 0

## 2012-10-25 LAB — CBC
HCT: 28.5 % — ABNORMAL LOW (ref 36.0–46.0)
MCV: 70.4 fL — ABNORMAL LOW (ref 78.0–100.0)
RDW: 22.4 % — ABNORMAL HIGH (ref 11.5–15.5)
WBC: 6 10*3/uL (ref 4.0–10.5)

## 2012-10-25 LAB — BASIC METABOLIC PANEL
CO2: 20 mEq/L (ref 19–32)
Chloride: 109 mEq/L (ref 96–112)
Creatinine, Ser: 0.72 mg/dL (ref 0.50–1.10)
Glucose, Bld: 84 mg/dL (ref 70–99)

## 2012-10-25 SURGERY — COLONOSCOPY
Anesthesia: Moderate Sedation

## 2012-10-25 MED ORDER — FENTANYL CITRATE 0.05 MG/ML IJ SOLN
INTRAMUSCULAR | Status: DC | PRN
  Administered 2012-10-25 (×4): 25 ug via INTRAVENOUS

## 2012-10-25 MED ORDER — FENTANYL CITRATE 0.05 MG/ML IJ SOLN
INTRAMUSCULAR | Status: AC
  Filled 2012-10-25: qty 4

## 2012-10-25 MED ORDER — MIDAZOLAM HCL 5 MG/5ML IJ SOLN
INTRAMUSCULAR | Status: DC | PRN
  Administered 2012-10-25 (×4): 2 mg via INTRAVENOUS

## 2012-10-25 MED ORDER — POTASSIUM CHLORIDE CRYS ER 20 MEQ PO TBCR
EXTENDED_RELEASE_TABLET | ORAL | Status: AC
  Filled 2012-10-25: qty 3

## 2012-10-25 MED ORDER — SODIUM CHLORIDE 0.9 % IV SOLN
1000.0000 mg | Freq: Once | INTRAVENOUS | Status: AC
  Administered 2012-10-25: 1000 mg via INTRAVENOUS
  Filled 2012-10-25: qty 20

## 2012-10-25 MED ORDER — POTASSIUM CHLORIDE CRYS ER 20 MEQ PO TBCR
60.0000 meq | EXTENDED_RELEASE_TABLET | Freq: Once | ORAL | Status: AC
  Administered 2012-10-25: 60 meq via ORAL

## 2012-10-25 MED ORDER — MIDAZOLAM HCL 5 MG/ML IJ SOLN
INTRAMUSCULAR | Status: AC
  Filled 2012-10-25: qty 2

## 2012-10-25 MED ORDER — SODIUM CHLORIDE 0.9 % IV SOLN
25.0000 mg | Freq: Once | INTRAVENOUS | Status: AC
  Administered 2012-10-25: 25 mg via INTRAVENOUS
  Filled 2012-10-25: qty 0.5

## 2012-10-25 MED ORDER — DIPHENHYDRAMINE HCL 50 MG/ML IJ SOLN
INTRAMUSCULAR | Status: AC
  Filled 2012-10-25: qty 1

## 2012-10-25 NOTE — Op Note (Signed)
[facility] [address] [place], [address]   COLONOSCOPY PROCEDURE REPORT  PATIENT: Laura Chang, Laura Chang  MR#: [id number] BIRTHDATE: July 21, 1956 , 56  yrs. old GENDER: Female ENDOSCOPIST: Dorena Cookey, MD REFERRED BY: PROCEDURE DATE:  10/25/2012 PROCEDURE: ASA CLASS: INDICATIONS:  severe anemia with negative endoscopy. MEDICATIONS: fentanyl 100 mcg, Versed 8 mg  DESCRIPTION OF PROCEDURE: the patient was placed in the left lateral decubitus position and placed on the pulse monitor with continuous low-flow oxygen delivered by nasal cannula. The Pentax videocolonoscope was inserted into the rectum and advanced to the cecum, confirmed by transillumination and Carneys point in visualization of ileocecal valve.  The cecum, descending, transverse, descending, sigmoid and rectum all appeared completely normal with no masses polyps diverticula or other mucosal abnormalities.     COMPLICATIONS: None  ENDOSCOPIC IMPRESSION:normal colonoscopy.  RECOMMENDATIONS:need further workup of anemia with Hemoccult testing once diet resumes and possible hematology referral if no source of bleeding confirmed.    _______________________________ Rosalie DoctorDorena Cookey, MD 10/25/2012 11:47 AM

## 2012-10-25 NOTE — Progress Notes (Signed)
[facility] Progress Note  Subjective: The patient tolerated her bowel prep well.  Objective: Vital signs in last 24 hours: Temp:  [98.2 F (36.8 C)-99 F (37.2 C)] 98.5 F (36.9 C) (12/14 1142) Pulse Rate:  [58-79] 79  (12/14 1142) Resp:  [13-44] 14  (12/14 1142) BP: (128-156)/(63-93) 137/78 mmHg (12/14 1142) SpO2:  [95 %-99 %] 99 % (12/14 1142) Weight change:    PE: Abdomen soft nondistended  Lab Results: Results for orders placed during the hospital encounter of 10/23/12 (from the past 24 hour(s))  GLUCOSE, CAPILLARY     Status: Normal   Collection Time   10/24/12 12:58 PM      Component Value Range   Glucose-Capillary 93  70 - 99 mg/dL  VITAMIN W09     Status: Normal   Collection Time   10/24/12  4:36 PM      Component Value Range   Vitamin B-12 602  211 - 911 pg/mL  FOLATE     Status: Normal   Collection Time   10/24/12  4:36 PM      Component Value Range   Folate >20.0    IRON AND TIBC     Status: Abnormal   Collection Time   10/24/12  4:36 PM      Component Value Range   Iron 343 (*) 42 - 135 ug/dL   TIBC 811 (*) 914 - 782 ug/dL   Saturation Ratios 49  20 - 55 %   UIBC 363  125 - 400 ug/dL  FERRITIN     Status: Abnormal   Collection Time   10/24/12  4:36 PM      Component Value Range   Ferritin 5 (*) 10 - 291 ng/mL  GLUCOSE, CAPILLARY     Status: Abnormal   Collection Time   10/24/12  5:22 PM      Component Value Range   Glucose-Capillary 67 (*) 70 - 99 mg/dL  GLUCOSE, CAPILLARY     Status: Abnormal   Collection Time   10/24/12  8:02 PM      Component Value Range   Glucose-Capillary 63 (*) 70 - 99 mg/dL  GLUCOSE, CAPILLARY     Status: Normal   Collection Time   10/25/12 12:34 AM      Component Value Range   Glucose-Capillary 86  70 - 99 mg/dL  GLUCOSE, CAPILLARY     Status: Normal   Collection Time   10/25/12  3:44 AM      Component Value Range   Glucose-Capillary 84  70 - 99 mg/dL   Comment 1 Notify RN     Comment 2 Documented  in Chart    BASIC METABOLIC PANEL     Status: Abnormal   Collection Time   10/25/12  4:53 AM      Component Value Range   Sodium 143  135 - 145 mEq/L   Potassium 3.4 (*) 3.5 - 5.1 mEq/L   Chloride 109  96 - 112 mEq/L   CO2 20  19 - 32 mEq/L   Glucose, Bld 84  70 - 99 mg/dL   BUN 7  6 - 23 mg/dL   Creatinine, Ser 9.56  0.50 - 1.10 mg/dL   Calcium 9.0  8.4 - 21.3 mg/dL   GFR calc non Af Amer >90  >90 mL/min   GFR calc Af Amer >90  >90 mL/min  CBC     Status: Abnormal   Collection Time   10/25/12  4:53 AM  Component Value Range   WBC 6.0  4.0 - 10.5 K/uL   RBC 4.05  3.87 - 5.11 MIL/uL   Hemoglobin 8.5 (*) 12.0 - 15.0 g/dL   HCT 16.1 (*) 09.6 - 04.5 %   MCV 70.4 (*) 78.0 - 100.0 fL   MCH 21.0 (*) 26.0 - 34.0 pg   MCHC 29.8 (*) 30.0 - 36.0 g/dL   RDW 40.9 (*) 81.1 - 91.4 %   Platelets 323  150 - 400 K/uL  GLUCOSE, CAPILLARY     Status: Normal   Collection Time   10/25/12  7:59 AM      Component Value Range   Glucose-Capillary 90  70 - 99 mg/dL   Comment 1 Documented in Chart     Comment 2 Notify RN      Studies/Results: Dg Chest 2 View  10/23/2012  *RADIOLOGY REPORT*  Clinical Data: Chest pressure.  Cough.  CHEST - 2 VIEW  Comparison: None.  Findings: Normal sized heart.  Clear lungs.  Moderate-sized hiatal hernia.  Cholecystectomy clips.  Mild thoracic spine degenerative changes.  IMPRESSION: No acute abnormality.  Moderate-sized hiatal hernia.   Original Report Authenticated By: Beckie Salts, M.D.    Colonoscopy completely normal Assessment: Anemia with no confirmation of GI bleeding but with large hiatal hernia on EGD and colonoscopy negative.  Plan: Will need Hemoccults to rule out or confirm any GI bleeding component to her anemia and capsule endoscopy if positive, if negative probably need hematology workup.    Gerlean Cid C 10/25/2012, 11:49 AM

## 2012-10-25 NOTE — Progress Notes (Signed)
Bilateral:  No evidence of DVT, superficial thrombosis, or Baker's Cyst.   

## 2012-10-25 NOTE — Progress Notes (Signed)
[facility] PROGRESS NOTE  Laura Chang WUJ:[id number] DOB: 09-07-1956 DOA: 10/23/2012 PCP: No primary provider on file.  HPI/Subjective: Feels much better, denies any shortness of breath.   Assessment/Plan:  Anemia, profound -Microcytic anemia with MCV of 66 and ferritin 5, consistent with iron deficiency anemia.  -IV iron to be given today, iron level is 340 suspect this is secondary to the recent transfusion. -Patient denies any melanotic stools, hematemesis or any previous bleeding. She is postmenopausal. -Transfusion of 4 units of packed RBCs. Post transfusion H&H is 8.5 -Patient is on twice a day Protonix, EGD and colonoscopy showed no evidence of active bleeding. -Patient mentioned family history of pernicious anemia, her B12 level is 602, ruling out PA. -FOBT to be done if it's positive proceed with capsule endoscopy per GI.  Alcohol abuse -We'll watch for withdrawal, CIWA protocol will be applied if has any withdrawal symptoms.  Tobacco use -Patient counseled extensively.  Code Status: Full code Family Communication: Spoke to the patient Disposition Plan: Remain inpatient   Consultants:  [facility]  Procedures:  EGD done on 10/24/2012 by Dr. Randa Evens: Showed no abnormalities.  Colonoscopy done on 10/25/2012 by Dr. Madilyn Fireman: Showed normal colon.  Antibiotics:  None   Objective: Filed Vitals:   10/25/12 1135 10/25/12 1140 10/25/12 1142 10/25/12 1150  BP: 136/93 137/78 137/78 126/61  Pulse:   79   Temp:   98.5 F (36.9 C)   TempSrc:   Oral   Resp: 19 13 14 13   Height:      Weight:      SpO2: 96% 98% 99% 99%    Intake/Output Summary (Last 24 hours) at 10/25/12 1230 Last data filed at 10/24/12 2000  Gross per 24 hour  Intake  582.5 ml  Output      0 ml  Net  582.5 ml   Filed Weights   10/23/12 1945 10/24/12 0100  Weight: 79.379 kg (175 lb) 79.379 kg (175 lb)    Exam:  General: Alert and awake, oriented x3, not  in any acute distress. HEENT: anicteric sclera, pupils reactive to light and accommodation, EOMI CVS: S1-S2 clear, no murmur rubs or gallops Chest: clear to auscultation bilaterally, no wheezing, rales or rhonchi Abdomen: soft nontender, nondistended, normal bowel sounds, no organomegaly Extremities: no cyanosis, clubbing or edema noted bilaterally Neuro: Cranial nerves II-XII intact, no focal neurological deficits  Data Reviewed: Basic Metabolic Panel:  Lab 10/25/12 9562 10/24/12 0500 10/23/12 1659  NA 143 142 139  K 3.4* 3.6 3.7  CL 109 109 104  CO2 20 20 23   GLUCOSE 84 97 112*  BUN 7 13 17   CREATININE 0.72 0.80 0.80  CALCIUM 9.0 8.7 9.3  MG -- -- --  PHOS -- -- --   Liver Function Tests:  Lab 10/24/12 0500 10/23/12 1659  AST 16 15  ALT 11 13  ALKPHOS 43 50  BILITOT 1.3* 0.4  PROT 5.9* 6.8  ALBUMIN 3.5 3.9   No results found for this basename: LIPASE:5,AMYLASE:5 in the last 168 hours No results found for this basename: AMMONIA:5 in the last 168 hours CBC:  Lab 10/25/12 0453 10/24/12 0500 10/23/12 1659  WBC 6.0 3.6* 4.3  NEUTROABS -- -- 2.5  HGB 8.5* 5.6* 4.8*  HCT 28.5* 20.2* 19.1*  MCV 70.4* 66.0* 62.6*  PLT 323 315 411*   Cardiac Enzymes: No results found for this basename: CKTOTAL:5,CKMB:5,CKMBINDEX:5,TROPONINI:5 in the last 168 hours BNP (last 3 results)  Basename 10/23/12 1708  PROBNP 248.6*  CBG:  Lab 10/25/12 0759 10/25/12 0344 10/25/12 0034 10/24/12 2002 10/24/12 1722  GLUCAP 90 84 86 63* 67*    No results found for this or any previous visit (from the past 240 hour(s)).   Studies: Dg Chest 2 View  10/23/2012  *RADIOLOGY REPORT*  Clinical Data: Chest pressure.  Cough.  CHEST - 2 VIEW  Comparison: None.  Findings: Normal sized heart.  Clear lungs.  Moderate-sized hiatal hernia.  Cholecystectomy clips.  Mild thoracic spine degenerative changes.  IMPRESSION: No acute abnormality.  Moderate-sized hiatal hernia.   Original Report Authenticated By:  Beckie Salts, M.D.     Scheduled Meds:    . [facility] HOLD] folic acid  1 mg Oral Daily  . [MAR HOLD] influenza  inactive virus vaccine  0.5 mL Intramuscular Tomorrow-1000  . [MAR HOLD] iron dextran (INFED/DEXFERRUM) 1000 MG IVPB  1,000 mg Intravenous Once  . [[facility] HOLD] LORazepam  0-4 mg Intravenous Q6H   Followed by  . [[facility] HOLD] LORazepam  0-4 mg Intravenous Q12H  . [MAR HOLD] multivitamin with minerals  1 tablet Oral Daily  . [[facility] HOLD] pantoprazole (PROTONIX) IV  40 mg Intravenous Q12H  . potassium chloride  60 mEq Oral Once  . [MAR HOLD] sodium chloride  3 mL Intravenous Q12H  . [facility] HOLD] thiamine  100 mg Oral Daily   Or  . [facility] HOLD] thiamine  100 mg Intravenous Daily   Continuous Infusions:    . sodium chloride 20 mL/hr at 10/24/12 0100    Principal Problem:  *Anemia Active Problems:  Depression  Alcoholism  Lower extremity edema  Tobacco abuse  ADD (attention deficit disorder)    Time spent: 35 minutes    [facility] A  [facility] Pager [phone]. If 8PM-8AM, please contact night-coverage at [email or web address], password [facility] 10/25/2012, 12:30 PM  LOS: 2 days

## 2012-10-26 DIAGNOSIS — D509 Iron deficiency anemia, unspecified: Secondary | ICD-10-CM

## 2012-10-26 LAB — CBC
MCH: 20.6 pg — ABNORMAL LOW (ref 26.0–34.0)
MCV: 70.7 fL — ABNORMAL LOW (ref 78.0–100.0)
Platelets: 354 10*3/uL (ref 150–400)
RDW: 23.9 % — ABNORMAL HIGH (ref 11.5–15.5)

## 2012-10-26 LAB — GLUCOSE, CAPILLARY: Glucose-Capillary: 105 mg/dL — ABNORMAL HIGH (ref 70–99)

## 2012-10-26 LAB — BASIC METABOLIC PANEL
CO2: 20 mEq/L (ref 19–32)
Calcium: 9.1 mg/dL (ref 8.4–10.5)
Creatinine, Ser: 0.7 mg/dL (ref 0.50–1.10)
Glucose, Bld: 96 mg/dL (ref 70–99)

## 2012-10-26 MED ORDER — FERROUS SULFATE 325 (65 FE) MG PO TABS: 325.0000 mg | ORAL_TABLET | Freq: Two times a day (BID) | ORAL | Status: DC

## 2012-10-26 MED ORDER — ADULT MULTIVITAMIN W/MINERALS CH: 1.0000 | ORAL_TABLET | Freq: Every day | ORAL | Status: DC

## 2012-10-26 MED ORDER — PANTOPRAZOLE SODIUM 40 MG PO TBEC: 40.0000 mg | DELAYED_RELEASE_TABLET | Freq: Every day | ORAL | Status: DC

## 2012-10-26 NOTE — Discharge Summary (Signed)
Physician Discharge Summary  YAIRE KREHER JXB:[id number] DOB: Nov 14, 1955 DOA: 10/23/2012  PCP: No primary provider on file.  Admit date: 10/23/2012 Discharge date: 10/26/2012  Time spent: 40 minutes  Recommendations for Outpatient Follow-up:  1. Followup with [facility] gastroenterology for iron deficiency anemia. 2. Followup with primary care physician.  Discharge Diagnoses:  Principal Problem:  *Iron deficiency anemia Active Problems:  Depression  Alcoholism  Lower extremity edema  Tobacco abuse  ADD (attention deficit disorder)   Discharge Condition: Stable  Diet recommendation: Regular  Filed Weights   10/23/12 1945 10/24/12 0100  Weight: 79.379 kg (175 lb) 79.379 kg (175 lb)    History of present illness:  56 year-old female with history of depression and ADD who drinks wine every day has been experiencing shortness of breath fatigue over the last few weeks. Patient also been having exertional chest tightness. Patient was referred to cardiologist and cardiologist was planning procedure and had preliminary blood work done and showed severe anemia and was referred to the ER. In the ER repeat blood work confirmed patient's hemoglobin to be around 5 and at this time has been admitted for further workup. Patient denies any blood in the stool or black stools denies any menorrhagia as patient has had menopause 4 years ago. Patient occasionally uses NSAIDs last use was last week. Stool for occult blood was negative as and but ER physician and stool was brown not melanotic. Patient otherwise is hemodynamically stable.   Hospital Course:   1. Profound anemia: Patient was presented with microcytic anemia, with MCV of 66, her ferritin also has 5 which is consistent with iron deficiency anemia. At the time of admission gastroenterology was consulted, an EGD and colonoscopy were done and showed no evidence of bleeding. Patient has had transfusion of total 4 units of packed RBCs. Patient  was presented with hemoglobin of 4.8 and hemoglobin at the time of discharge is 8.6. Patient also had administration of 1025 mg of IV iron. Dr. Randa Evens recommended aggressive treatment for reflux with PPIs. Also biopsies were taken to rule out celiac disease. Discussed with Dr. Dorena Cookey and because of no overt bleeding, elected not to do capsule endoscopy for now because it will be low yield. Patient to followup with [facility] gastroenterology as outpatient for further evaluation. Patient mentioned that she had a grandmother has history of pernicious anemia. The patient's B12 level was normal of 602.  2. Alcohol abuse: Patient counseled extensively about alcohol, could have acute gastritis secondary to alcohol and resolved. Patient does not have any symptoms or signs suggesting alcohol withdrawal.  3. Tobacco use: Patient counseled.  4. Depression and ADD: Her medication were continued throughout the hospital stay without any changes.  Procedures:  EGD done on 10/24/2012 by Dr. Randa Evens showed small ulcerated nodular area at the GE junction, this is probably inflammatory in nature secondary to reflux. No evidence of bleeding.  Colonoscopy done on 10/25/2012 by Dr. Madilyn Fireman showed normal colon without evidence of bleeding.  Administration of 1 g of IV iron (InFed) proceeded by 25 mg of test dose.  Consultations:  Carman Ching of [facility] gastroenterology  Discharge Exam: Filed Vitals:   10/25/12 1330 10/25/12 1735 10/25/12 2150 10/26/12 0424  BP: 149/78 150/77 131/71 156/77  Pulse: 77 76 72 79  Temp: 98.8 F (37.1 C)  99.5 F (37.5 C) 98.1 F (36.7 C)  TempSrc: Oral  Oral Oral  Resp: 18  18 19   Height:      Weight:  SpO2: 97%  94% 96%   General: Alert and awake, oriented x3, not in any acute distress. HEENT: anicteric sclera, pupils reactive to light and accommodation, EOMI CVS: S1-S2 clear, no murmur rubs or gallops Chest: clear to auscultation bilaterally, no wheezing, rales or  rhonchi Abdomen: soft nontender, nondistended, normal bowel sounds, no organomegaly Extremities: no cyanosis, clubbing or edema noted bilaterally Neuro: Cranial nerves II-XII intact, no focal neurological deficits  Discharge Instructions  Discharge Orders    Future Orders Please Complete By Expires   Increase activity slowly          Medication List     As of 10/26/2012 11:26 AM    TAKE these medications         ADDERALL 20 MG tablet   Generic drug: amphetamine-dextroamphetamine   Take 20 mg by mouth 2 (two) times daily as needed. For ADD      ferrous sulfate 325 (65 FE) MG tablet   Take 1 tablet (325 mg total) by mouth 2 (two) times daily.      multivitamin with minerals Tabs   Take 1 tablet by mouth daily.      Vilazodone HCl 40 MG Tabs   Commonly known as: VIIBRYD   Take 40 mg by mouth daily.           Follow-up Information    Follow up with EDWARDS JR,JAMES L, MD. In 2 weeks.   Contact information:   [address]                         [place] [address] [phone]       Follow up with GAY,APRIL L, MD. In 2 weeks.   Contact information:   [address] [place] [address] [phone]           The results of significant diagnostics from this hospitalization (including imaging, microbiology, ancillary and laboratory) are listed below for reference.    Significant Diagnostic Studies: Dg Chest 2 View  10/23/2012  *RADIOLOGY REPORT*  Clinical Data: Chest pressure.  Cough.  CHEST - 2 VIEW  Comparison: None.  Findings: Normal sized heart.  Clear lungs.  Moderate-sized hiatal hernia.  Cholecystectomy clips.  Mild thoracic spine degenerative changes.  IMPRESSION: No acute abnormality.  Moderate-sized hiatal hernia.   Original Report Authenticated By: Beckie Salts, M.D.     Microbiology: No results found for this or any previous visit (from the past 240 hour(s)).   Labs: Basic Metabolic Panel:  Lab 10/26/12 5784  10/25/12 0453 10/24/12 0500 10/23/12 1659  NA 140 143 142 139  K 3.8 3.4* 3.6 3.7  CL 106 109 109 104  CO2 20 20 20 23   GLUCOSE 96 84 97 112*  BUN 10 7 13 17   CREATININE 0.70 0.72 0.80 0.80  CALCIUM 9.1 9.0 8.7 9.3  MG -- -- -- --  PHOS -- -- -- --   Liver Function Tests:  Lab 10/24/12 0500 10/23/12 1659  AST 16 15  ALT 11 13  ALKPHOS 43 50  BILITOT 1.3* 0.4  PROT 5.9* 6.8  ALBUMIN 3.5 3.9   No results found for this basename: LIPASE:5,AMYLASE:5 in the last 168 hours No results found for this basename: AMMONIA:5 in the last 168 hours CBC:  Lab 10/26/12 0640 10/25/12 0453 10/24/12 0500 10/23/12 1659  WBC 7.2 6.0 3.6* 4.3  NEUTROABS -- -- -- 2.5  HGB 8.6* 8.5* 5.6* 4.8*  HCT  29.5* 28.5* 20.2* 19.1*  MCV 70.7* 70.4* 66.0* 62.6*  PLT 354 323 315 411*   Cardiac Enzymes: No results found for this basename: CKTOTAL:5,CKMB:5,CKMBINDEX:5,TROPONINI:5 in the last 168 hours BNP: BNP (last 3 results)  Basename 10/23/12 1708  PROBNP 248.6*   CBG:  Lab 10/26/12 0746 10/25/12 2148 10/25/12 1727 10/25/12 0759 10/25/12 0344  GLUCAP 105* 87 112* 90 84       Signed:  Joslynne Klatt A  [facility] 10/26/2012, 11:26 AM

## 2012-10-26 NOTE — Progress Notes (Signed)
Laura Chang to be D/C'd Home per MD order.  Discharge instructions reviewed and discussed with the patient, all questions and concerns answered. Copy of instructions and scripts given to patient.   Laura Chang, Laura Chang  Home Medication Instructions [id number]   Printed on:10/26/12 1207  Medication Information                    amphetamine-dextroamphetamine (ADDERALL) 20 MG tablet Take 20 mg by mouth 2 (two) times daily as needed. For ADD           Vilazodone HCl (VIIBRYD) 40 MG TABS Take 40 mg by mouth daily.           Multiple Vitamin (MULTIVITAMIN WITH MINERALS) TABS Take 1 tablet by mouth daily.           ferrous sulfate 325 (65 FE) MG tablet Take 1 tablet (325 mg total) by mouth 2 (two) times daily.           pantoprazole (PROTONIX) 40 MG tablet Take 1 tablet (40 mg total) by mouth daily.             Patients skin is clean, dry and intact no evidence of skin break down. IV site discontinued and catheter remains intact. Site without signs and symptoms of complications. Dressing and pressure applied.  Patient escorted to car by NT in Chang wheelchair,  no distress noted upon discharge.  Bing Quarry 10/26/2012 12:07 PM

## 2012-10-27 ENCOUNTER — Encounter (HOSPITAL_COMMUNITY): Payer: Self-pay | Admitting: Gastroenterology

## 2012-10-27 ENCOUNTER — Encounter (HOSPITAL_BASED_OUTPATIENT_CLINIC_OR_DEPARTMENT_OTHER): Admission: RE | Payer: Self-pay | Source: Ambulatory Visit

## 2012-10-27 ENCOUNTER — Inpatient Hospital Stay (HOSPITAL_BASED_OUTPATIENT_CLINIC_OR_DEPARTMENT_OTHER): Admission: RE | Admit: 2012-10-27 | Payer: 59 | Source: Ambulatory Visit | Admitting: Interventional Cardiology

## 2012-10-27 SURGERY — JV LEFT HEART CATHETERIZATION WITH CORONARY ANGIOGRAM
Anesthesia: Moderate Sedation

## 2015-07-19 ENCOUNTER — Encounter (HOSPITAL_COMMUNITY): Payer: Self-pay | Admitting: Emergency Medicine

## 2015-07-19 ENCOUNTER — Emergency Department (HOSPITAL_COMMUNITY)
Admission: EM | Admit: 2015-07-19 | Discharge: 2015-07-20 | Disposition: A | Discharge status: Home / Self Care | Payer: 59 | Attending: Emergency Medicine | Admitting: Emergency Medicine

## 2015-07-19 DIAGNOSIS — Z79899 Other long term (current) drug therapy: Secondary | ICD-10-CM | POA: Insufficient documentation

## 2015-07-19 DIAGNOSIS — F332 Major depressive disorder, recurrent severe without psychotic features: Secondary | ICD-10-CM | POA: Diagnosis not present

## 2015-07-19 DIAGNOSIS — D649 Anemia, unspecified: Secondary | ICD-10-CM | POA: Diagnosis not present

## 2015-07-19 DIAGNOSIS — R45851 Suicidal ideations: Secondary | ICD-10-CM | POA: Diagnosis present

## 2015-07-19 DIAGNOSIS — R4781 Slurred speech: Secondary | ICD-10-CM | POA: Insufficient documentation

## 2015-07-19 DIAGNOSIS — F10920 Alcohol use, unspecified with intoxication, uncomplicated: Secondary | ICD-10-CM

## 2015-07-19 DIAGNOSIS — F329 Major depressive disorder, single episode, unspecified: Secondary | ICD-10-CM | POA: Insufficient documentation

## 2015-07-19 DIAGNOSIS — F10120 Alcohol abuse with intoxication, uncomplicated: Secondary | ICD-10-CM | POA: Diagnosis not present

## 2015-07-19 DIAGNOSIS — Z720 Tobacco use: Secondary | ICD-10-CM | POA: Insufficient documentation

## 2015-07-19 DIAGNOSIS — F32A Depression, unspecified: Secondary | ICD-10-CM

## 2015-07-19 DIAGNOSIS — F909 Attention-deficit hyperactivity disorder, unspecified type: Secondary | ICD-10-CM | POA: Diagnosis not present

## 2015-07-19 DIAGNOSIS — R011 Cardiac murmur, unspecified: Secondary | ICD-10-CM | POA: Diagnosis not present

## 2015-07-19 DIAGNOSIS — F1020 Alcohol dependence, uncomplicated: Secondary | ICD-10-CM | POA: Diagnosis present

## 2015-07-19 LAB — COMPREHENSIVE METABOLIC PANEL
ALBUMIN: 4.4 g/dL (ref 3.5–5.0)
ALK PHOS: 60 U/L (ref 38–126)
ALT: 44 U/L (ref 14–54)
ANION GAP: 13 (ref 5–15)
AST: 106 U/L — ABNORMAL HIGH (ref 15–41)
BILIRUBIN TOTAL: 0.6 mg/dL (ref 0.3–1.2)
BUN: 16 mg/dL (ref 6–20)
CALCIUM: 9.1 mg/dL (ref 8.9–10.3)
CO2: 20 mmol/L — ABNORMAL LOW (ref 22–32)
Chloride: 111 mmol/L (ref 101–111)
Creatinine, Ser: 0.69 mg/dL (ref 0.44–1.00)
GFR calc Af Amer: >60 mL/min (ref >60)
GFR calc non Af Amer: >60 mL/min (ref >60)
GLUCOSE: 143 mg/dL — AB (ref 65–99)
Potassium: 3.8 mmol/L (ref 3.5–5.1)
Sodium: 144 mmol/L (ref 135–145)
TOTAL PROTEIN: 7.7 g/dL (ref 6.5–8.1)

## 2015-07-19 LAB — CBC WITH DIFFERENTIAL/PLATELET
BASOS ABS: 0 10*3/uL (ref 0.0–0.1)
Basophils Relative: 0 % (ref 0–1)
Eosinophils Absolute: 0 10*3/uL (ref 0.0–0.7)
Eosinophils Relative: 0 % (ref 0–5)
HEMATOCRIT: 42 % (ref 36.0–46.0)
HEMOGLOBIN: 13.8 g/dL (ref 12.0–15.0)
LYMPHS PCT: 6 % — AB (ref 12–46)
Lymphs Abs: 0.6 10*3/uL — ABNORMAL LOW (ref 0.7–4.0)
MCH: 31.8 pg (ref 26.0–34.0)
MCHC: 32.9 g/dL (ref 30.0–36.0)
MCV: 96.8 fL (ref 78.0–100.0)
MONO ABS: 0.4 10*3/uL (ref 0.1–1.0)
Monocytes Relative: 4 % (ref 3–12)
NEUTROS ABS: 9.5 10*3/uL — AB (ref 1.7–7.7)
NEUTROS PCT: 90 % — AB (ref 43–77)
Platelets: 298 10*3/uL (ref 150–400)
RBC: 4.34 MIL/uL (ref 3.87–5.11)
RDW: 12.4 % (ref 11.5–15.5)
WBC: 10.6 10*3/uL — AB (ref 4.0–10.5)

## 2015-07-19 LAB — RAPID URINE DRUG SCREEN, HOSP PERFORMED
Amphetamines: POSITIVE — AB
BARBITURATES: NOT DETECTED
Benzodiazepines: NOT DETECTED
COCAINE: NOT DETECTED
Opiates: NOT DETECTED
Tetrahydrocannabinol: NOT DETECTED

## 2015-07-19 LAB — ETHANOL: Alcohol, Ethyl (B): 261 mg/dL — ABNORMAL HIGH (ref <5)

## 2015-07-19 MED ORDER — THIAMINE HCL 100 MG/ML IJ SOLN: 100.0000 mg | Freq: Every day | INTRAMUSCULAR | Status: DC

## 2015-07-19 MED ORDER — LORAZEPAM 1 MG PO TABS
0.0000 mg | ORAL_TABLET | Freq: Four times a day (QID) | ORAL | Status: DC
  Administered 2015-07-20: 2 mg via ORAL
  Administered 2015-07-20: 1 mg via ORAL
  Filled 2015-07-19: qty 2
  Filled 2015-07-19: qty 1

## 2015-07-19 MED ORDER — LORAZEPAM 1 MG PO TABS: 0.0000 mg | ORAL_TABLET | Freq: Two times a day (BID) | ORAL | Status: DC

## 2015-07-19 MED ORDER — VITAMIN B-1 100 MG PO TABS
100.0000 mg | ORAL_TABLET | Freq: Every day | ORAL | Status: DC
  Administered 2015-07-20: 100 mg via ORAL
  Filled 2015-07-19: qty 1

## 2015-07-19 MED ORDER — ALUM & MAG HYDROXIDE-SIMETH 200-200-20 MG/5ML PO SUSP: 30.0000 mL | ORAL | Status: DC | PRN

## 2015-07-19 MED ORDER — ONDANSETRON HCL 4 MG PO TABS: 4.0000 mg | ORAL_TABLET | Freq: Three times a day (TID) | ORAL | Status: DC | PRN

## 2015-07-19 NOTE — ED Notes (Signed)
EDP Pickering at bedside 

## 2015-07-19 NOTE — ED Provider Notes (Signed)
CSN: [id number]     Arrival date & time 07/19/15  2030 History   First MD Initiated Contact with Patient 07/19/15 2056     Chief Complaint  Patient presents with  . Suicidal    ETOH intoxication    Level  5 caveat due to intoxication. (Consider location/radiation/quality/duration/timing/severity/associated sxs/prior Treatment) The history is provided by the patient.  patient presents with alcohol intoxication and suicidal thoughts. States she has a lot going on in life. States she has a daughter is getting married and some children leaving home. States she's been dealing with depression for a long time. States she drinks alcohol heavily because it helps her feel better.denies other substance abuse.  Past Medical History  Diagnosis Date  . Depression   . PONV (postoperative nausea and vomiting)   . Heart murmur   . Anemia    Past Surgical History  Procedure Laterality Date  . Cholecystectomy    . Esophagogastroduodenoscopy  10/24/2012    Procedure: ESOPHAGOGASTRODUODENOSCOPY (EGD);  Surgeon: Vertell Novak., MD;  Location: [facility] ENDOSCOPY;  Service: Endoscopy;  Laterality: N/A;  . Colonoscopy  10/25/2012    Procedure: COLONOSCOPY;  Surgeon: Barrie Folk, MD;  Location: [facility] ENDOSCOPY;  Service: Endoscopy;  Laterality: N/A;   Family History  Problem Relation Age of Onset  . Atrial fibrillation Mother   . CAD Father    Social History  Substance Use Topics  . Smoking status: Current Some Day Smoker  . Smokeless tobacco: None  . Alcohol Use: Yes   OB History    No data available     Review of Systems  Unable to perform ROS Constitutional: Negative for appetite change.  Musculoskeletal: Negative for back pain.  Psychiatric/Behavioral: Positive for suicidal ideas and dysphoric mood.      Allergies  Review of patient's allergies indicates no known allergies.  Home Medications   Prior to Admission medications   Medication Sig Start Date End Date Taking? Authorizing  Provider  amphetamine-dextroamphetamine (ADDERALL) 15 MG tablet Take 15 mg by mouth 2 (two) times daily.   Yes Historical Provider, MD  busPIRone (BUSPAR) 10 MG tablet Take 10 mg by mouth 2 (two) times daily.   Yes Historical Provider, MD  ferrous sulfate 325 (65 FE) MG tablet Take 1 tablet (325 mg total) by mouth 2 (two) times daily. 10/26/12  Yes Clydia Llano, MD  FLUoxetine (PROZAC) 20 MG capsule Take 20 mg by mouth daily.   Yes Historical Provider, MD  amphetamine-dextroamphetamine (ADDERALL) 20 MG tablet Take 20 mg by mouth 2 (two) times daily as needed. For ADD    Historical Provider, MD  Multiple Vitamin (MULTIVITAMIN WITH MINERALS) TABS Take 1 tablet by mouth daily. Patient not taking: Reported on 07/19/2015 10/26/12   Clydia Llano, MD  pantoprazole (PROTONIX) 40 MG tablet Take 1 tablet (40 mg total) by mouth daily. Patient not taking: Reported on 07/19/2015 10/26/12   Clydia Llano, MD  Vilazodone HCl (VIIBRYD) 40 MG TABS Take 40 mg by mouth daily.    Historical Provider, MD   BP 111/67 mmHg  Pulse 81  Temp(Src) 98 F (36.7 C) (Oral)  Resp 16  SpO2 98% Physical Exam  Constitutional: She appears well-developed and well-nourished.  HENT:  Head: Atraumatic.  Neck: Neck supple.  Cardiovascular: Normal rate and regular rhythm.   Pulmonary/Chest: Effort normal.  Musculoskeletal: She exhibits no tenderness.  Neurological: She is alert.  Patient has slurred speech and appears intoxicated  Skin: Skin is warm.  Psychiatric:  Patient  has a depressed affect    ED Course  Procedures (including critical care time) Labs Review Labs Reviewed  COMPREHENSIVE METABOLIC PANEL - Abnormal; Notable for the following:    CO2 20 (*)    Glucose, Bld 143 (*)    AST 106 (*)    All other components within normal limits  ETHANOL - Abnormal; Notable for the following:    Alcohol, Ethyl (B) 261 (*)    All other components within normal limits  URINE RAPID DRUG SCREEN, HOSP PERFORMED - Abnormal;  Notable for the following:    Amphetamines POSITIVE (*)    All other components within normal limits  CBC WITH DIFFERENTIAL/PLATELET - Abnormal; Notable for the following:    WBC 10.6 (*)    Neutrophils Relative % 90 (*)    Neutro Abs 9.5 (*)    Lymphocytes Relative 6 (*)    Lymphs Abs 0.6 (*)    All other components within normal limits    Imaging Review No results found. I have personally reviewed and evaluated these images and lab results as part of my medical decision-making.   EKG Interpretation None      MDM   Final diagnoses:  Alcohol intoxication, uncomplicated  Depression  Suicidal ideations    Patient is intoxicated. Reported depression and suicidal thoughts.appears to medically cleared at this time but will need to monitor for withdrawal. Will get TTS evaluation but will likely relate towards more sobriety    Benjiman Core, MD 07/19/15 2352

## 2015-07-19 NOTE — ED Notes (Signed)
Bed: LK44 Expected date:  Expected time:  Means of arrival:  Comments: ETOH, SI

## 2015-07-19 NOTE — ED Notes (Signed)
Per [facility], pt from home , pt has been drinking Vodka for the past six hours. Pt denies drugs or meds overdose. Pt reported to EMS personnel " I want die, this the most suicidal I have ever been". Pt appears alcohol intoxicated , pt is alert and per ems she is oriented x 4. Per neighbors pt has tendencies to alcohol abuse. CBG per [facility] 158

## 2015-07-19 NOTE — ED Notes (Signed)
Pt ambulatory to bathroom w/o assistance.  Pt has a steady gait.

## 2015-07-19 NOTE — ED Notes (Signed)
Notified staffing office for sitter need

## 2015-07-20 DIAGNOSIS — F1020 Alcohol dependence, uncomplicated: Secondary | ICD-10-CM

## 2015-07-20 DIAGNOSIS — F10929 Alcohol use, unspecified with intoxication, unspecified: Secondary | ICD-10-CM | POA: Insufficient documentation

## 2015-07-20 DIAGNOSIS — F909 Attention-deficit hyperactivity disorder, unspecified type: Secondary | ICD-10-CM | POA: Diagnosis not present

## 2015-07-20 DIAGNOSIS — F332 Major depressive disorder, recurrent severe without psychotic features: Secondary | ICD-10-CM | POA: Diagnosis not present

## 2015-07-20 MED ORDER — ACETAMINOPHEN 325 MG PO TABS
650.0000 mg | ORAL_TABLET | Freq: Once | ORAL | Status: AC
  Administered 2015-07-20: 650 mg via ORAL
  Filled 2015-07-20: qty 2

## 2015-07-20 NOTE — Consult Note (Signed)
[facility] Face-to-Face Psychiatry Consult   Reason for Consult: Alcohol use disorder, Moderate, Dependence Referring Physician:  EDP Patient Identification: Laura Chang MRN:  [id number] Principal Diagnosis: Alcohol use disorder, moderate, dependence Diagnosis:   Patient Active Problem List   Diagnosis Date Noted  . Alcohol use disorder, moderate, dependence [F10.20] 07/20/2015    Priority: High  . Iron deficiency anemia [D50.9] 10/23/2012  . Alcoholism [F10.20] 10/23/2012  . Lower extremity edema [R60.0] 10/23/2012  . Tobacco abuse [Z72.0] 10/23/2012  . ADD (attention deficit disorder) [F90.9] 10/23/2012  . Depression [F32.9]     Total Time spent with patient: 1 hour  Subjective:   Laura Chang is a 59 y.o. female patient admitted with Alcohol use disorder, Moderate, Dependence  HPI:  Caucasian female, 60 years old was evaluated for Alcohol intoxication.  She reports increase use of Alcohol due to stress and that she drank a whole mug of bloody [place] Kidney.  She reports that she became intoxicated and stated that she wanted to kill herself.  This morning she is alert and oriented x 4 and did not remember making a suicidal statement.  Patient admitted to drinking more Alcohol than she regularly drinks due to the stressor of empt nest and upcoming wedding.  She reports stress from her daughter's upcoming wedding and the moving out of her house at the same time by her children making her sad as the reason for drinking too much.  Patient reports a diagnosis of ADD and depression and is on medication.   She sees Dr Clovis Pu for her Psychiatric needs and sees a counselor for therapy. Patient denies SI/HI/AVH and is discharged home.  She has an appointment with Dr Clovis Pu and will contact her counselor for therapy sessions.  HPI Elements:   Location:  Alcohol use disorder, moderate, dependence, Recurrent MDD, SEVERE, ADD. Quality:  SEVERE. Severity:  SEVERE. Timing:  ACUTE. Duration:  SUDDEN. Context:   Brought for Alcohol intoxication..  Past Medical History:  Past Medical History  Diagnosis Date  . Depression   . PONV (postoperative nausea and vomiting)   . Heart murmur   . Anemia     Past Surgical History  Procedure Laterality Date  . Cholecystectomy    . Esophagogastroduodenoscopy  10/24/2012    Procedure: ESOPHAGOGASTRODUODENOSCOPY (EGD);  Surgeon: Winfield Cunas., MD;  Location: [facility] ENDOSCOPY;  Service: Endoscopy;  Laterality: N/A;  . Colonoscopy  10/25/2012    Procedure: COLONOSCOPY;  Surgeon: Missy Sabins, MD;  Location: [place];  Service: Endoscopy;  Laterality: N/A;   Family History:  Family History  Problem Relation Age of Onset  . Atrial fibrillation Mother   . CAD Father    Social History:  History  Alcohol Use  . Yes     History  Drug Use No    Social History   Social History  . Marital Status: Married    Spouse Name: N/A  . Number of Children: N/A  . Years of Education: N/A   Social History Main Topics  . Smoking status: Current Some Day Smoker  . Smokeless tobacco: None  . Alcohol Use: Yes  . Drug Use: No  . Sexual Activity: Not Asked   Other Topics Concern  . None   Social History Narrative   Additional Social History:    Prescriptions: See PTA list History of alcohol / drug use?: Yes Longest period of sobriety (when/how long): "3 seasons of a year when my adult children were little" Name of Substance 1:  Alcohol 1 - Age of First Use: 17 1 - Amount (size/oz): usually 2.5 glasses of wine/day but when "acting out" 1.5 to 2 bottles of wine per day, drinking all day 1 - Frequency: daily 1 - Duration: years 1 - Last Use / Amount: today- drinking vodka for 6 hours per EMS report Name of Substance 2: Nicotine 2 - Age of First Use: teens 2 - Amount (size/oz): occcasional smiker                 Allergies:  No Known Allergies  Labs:  Results for orders placed or performed during the hospital encounter of 07/19/15 (from the  past 48 hour(s))  Comprehensive metabolic panel     Status: Abnormal   Collection Time: 07/19/15 10:08 PM  Result Value Ref Range   Sodium 144 135 - 145 mmol/L   Potassium 3.8 3.5 - 5.1 mmol/L   Chloride 111 101 - 111 mmol/L   CO2 20 (L) 22 - 32 mmol/L   Glucose, Bld 143 (H) 65 - 99 mg/dL   BUN 16 6 - 20 mg/dL   Creatinine, Ser 0.69 0.44 - 1.00 mg/dL   Calcium 9.1 8.9 - 10.3 mg/dL   Total Protein 7.7 6.5 - 8.1 g/dL   Albumin 4.4 3.5 - 5.0 g/dL   AST 106 (H) 15 - 41 U/L   ALT 44 14 - 54 U/L   Alkaline Phosphatase 60 38 - 126 U/L   Total Bilirubin 0.6 0.3 - 1.2 mg/dL   GFR calc non Af Amer >60 >60 mL/min   GFR calc Af Amer >60 >60 mL/min    Comment: (NOTE) The eGFR has been calculated using the CKD EPI equation. This calculation has not been validated in all clinical situations. eGFR's persistently <60 mL/min signify possible Chronic Kidney Disease.    Anion gap 13 5 - 15  Ethanol     Status: Abnormal   Collection Time: 07/19/15 10:08 PM  Result Value Ref Range   Alcohol, Ethyl (B) 261 (H) <5 mg/dL    Comment:        LOWEST DETECTABLE LIMIT FOR SERUM ALCOHOL IS 5 mg/dL FOR MEDICAL PURPOSES ONLY   CBC with Differential     Status: Abnormal   Collection Time: 07/19/15 10:08 PM  Result Value Ref Range   WBC 10.6 (H) 4.0 - 10.5 K/uL   RBC 4.34 3.87 - 5.11 MIL/uL   Hemoglobin 13.8 12.0 - 15.0 g/dL   HCT 42.0 36.0 - 46.0 %   MCV 96.8 78.0 - 100.0 fL   MCH 31.8 26.0 - 34.0 pg   MCHC 32.9 30.0 - 36.0 g/dL   RDW 12.4 11.5 - 15.5 %   Platelets 298 150 - 400 K/uL   Neutrophils Relative % 90 (H) 43 - 77 %   Neutro Abs 9.5 (H) 1.7 - 7.7 K/uL   Lymphocytes Relative 6 (L) 12 - 46 %   Lymphs Abs 0.6 (L) 0.7 - 4.0 K/uL   Monocytes Relative 4 3 - 12 %   Monocytes Absolute 0.4 0.1 - 1.0 K/uL   Eosinophils Relative 0 0 - 5 %   Eosinophils Absolute 0.0 0.0 - 0.7 K/uL   Basophils Relative 0 0 - 1 %   Basophils Absolute 0.0 0.0 - 0.1 K/uL  Urine rapid drug screen (hosp performed)      Status: Abnormal   Collection Time: 07/19/15 11:10 PM  Result Value Ref Range   Opiates NONE DETECTED NONE DETECTED   Cocaine NONE  DETECTED NONE DETECTED   Benzodiazepines NONE DETECTED NONE DETECTED   Amphetamines POSITIVE (A) NONE DETECTED   Tetrahydrocannabinol NONE DETECTED NONE DETECTED   Barbiturates NONE DETECTED NONE DETECTED    Comment:        DRUG SCREEN FOR MEDICAL PURPOSES ONLY.  IF CONFIRMATION IS NEEDED FOR ANY PURPOSE, NOTIFY LAB WITHIN 5 DAYS.        LOWEST DETECTABLE LIMITS FOR URINE DRUG SCREEN Drug Class       Cutoff (ng/mL) Amphetamine      1000 Barbiturate      200 Benzodiazepine   818 Tricyclics       299 Opiates          300 Cocaine          300 THC              50     Vitals: Blood pressure 136/87, pulse 87, temperature 98.8 F (37.1 C), temperature source Oral, resp. rate 18, SpO2 98 %.  Risk to Self: Suicidal Ideation: No-Not Currently/Within Last 6 Months (earlier today made suicidal comments) Suicidal Intent:  (denies serious intention) Is patient at risk for suicide?: No Suicidal Plan?:  (denies plan) Access to Means: No What has been your use of drugs/alcohol within the last 12 months?: daily alcohol use How many times?: 0 Other Self Harm Risks: none noted Triggers for Past Attempts:  (na) Intentional Self Injurious Behavior: None Risk to Others: Homicidal Ideation: No (denies) Thoughts of Harm to Others: No (denies) Current Homicidal Intent: No Current Homicidal Plan: No Access to Homicidal Means: No Identified Victim: na History of harm to others?: No (denies) Assessment of Violence: None Noted Violent Behavior Description: na Does patient have access to weapons?: No (denies) Criminal Charges Pending?: No Does patient have a court date: No Prior Inpatient Therapy: Prior Inpatient Therapy: No Prior Therapy Dates: na Prior Therapy Facilty/Provider(s): na Reason for Treatment: na Prior Outpatient Therapy: Prior Outpatient Therapy:  No Prior Therapy Dates: na Prior Therapy Facilty/Provider(s): na Reason for Treatment: na Does patient have an ACCT team?: No Does patient have Intensive In-House Services?  : No Does patient have [facility] services? : No Does patient have P4CC services?: No  Current Facility-Administered Medications  Medication Dose Route Frequency Provider Last Rate Last Dose  . alum & mag hydroxide-simeth (MAALOX/MYLANTA) 200-200-20 MG/5ML suspension 30 mL  30 mL Oral PRN Davonna Belling, MD      . LORazepam (ATIVAN) tablet 0-4 mg  0-4 mg Oral 4 times per day Davonna Belling, MD   1 mg at 07/20/15 0944   Followed by  . [START ON 07/22/2015] LORazepam (ATIVAN) tablet 0-4 mg  0-4 mg Oral Q12H Davonna Belling, MD      . ondansetron [facility]) tablet 4 mg  4 mg Oral Q8H PRN Davonna Belling, MD      . thiamine (VITAMIN B-1) tablet 100 mg  100 mg Oral Daily Davonna Belling, MD   100 mg at 07/20/15 3716   Or  . thiamine (B-1) injection 100 mg  100 mg Intravenous Daily Davonna Belling, MD       Current Outpatient Prescriptions  Medication Sig Dispense Refill  . amphetamine-dextroamphetamine (ADDERALL) 15 MG tablet Take 15 mg by mouth 2 (two) times daily.    . busPIRone (BUSPAR) 10 MG tablet Take 10 mg by mouth 2 (two) times daily.    . ferrous sulfate 325 (65 FE) MG tablet Take 1 tablet (325 mg total) by mouth 2 (two) times daily.  60 tablet 3  . FLUoxetine (PROZAC) 20 MG capsule Take 20 mg by mouth daily.    Marland Kitchen amphetamine-dextroamphetamine (ADDERALL) 20 MG tablet Take 20 mg by mouth 2 (two) times daily as needed. For ADD    . Multiple Vitamin (MULTIVITAMIN WITH MINERALS) TABS Take 1 tablet by mouth daily. (Patient not taking: Reported on 07/19/2015) 30 tablet 0  . pantoprazole (PROTONIX) 40 MG tablet Take 1 tablet (40 mg total) by mouth daily. (Patient not taking: Reported on 07/19/2015) 30 tablet 0  . Vilazodone HCl (VIIBRYD) 40 MG TABS Take 40 mg by mouth daily.      Musculoskeletal: Strength & Muscle Tone:  within normal limits Gait & Station: normal Patient leans: N/A  Psychiatric Specialty Exam: Physical Exam  Review of Systems  Constitutional: Negative.   HENT: Negative.   Eyes: Negative.   Respiratory: Negative.   Cardiovascular: Negative.   Gastrointestinal: Negative.   Genitourinary: Negative.   Musculoskeletal: Negative.   Skin: Negative.   Neurological: Negative.   Endo/Heme/Allergies: Negative.     Blood pressure 136/87, pulse 87, temperature 98.8 F (37.1 C), temperature source Oral, resp. rate 18, SpO2 98 %.There is no weight on file to calculate BMI.  General Appearance: Casual and Fairly Groomed  Eye Contact::  Good  Speech:  Clear and Coherent and Normal Rate  Volume:  Normal  Mood:  Anxious and Depressed  Affect:  Congruent, Depressed and Tearful  Thought Process:  Coherent, Goal Directed, Intact and Logical  Orientation:  Full (Time, Place, and Person)  Thought Content:  WDL  Suicidal Thoughts:  No  Homicidal Thoughts:  No  Memory:  Immediate;   Good Recent;   Good Remote;   Good  Judgement:  Fair  Insight:  Good  Psychomotor Activity:  Normal  Concentration:  Good  Recall:  NA  Fund of Knowledge:Good  Language: Good  Akathisia:  NA  Handed:  Right  AIMS (if indicated):     Assets:  Desire for Improvement  ADL's:  Intact  Cognition: WNL  Sleep:      Medical Decision Making: Established Problem, Stable/Improving (1)   Disposition: Discharge home, follow up with your outpatient Psychiatrist and counselor  Delfin Gant   PMHNP-BC 07/20/2015 1:57 PM Patient seen face-to-face for psychiatric evaluation, chart reviewed and case discussed with the physician extender and developed treatment plan. Reviewed the information documented and agree with the treatment plan. Corena Pilgrim, MD
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
## 2015-07-20 NOTE — ED Notes (Signed)
After rounding had been complete. Pt sitting up in bed with lights off. States "I feel yucky, my head is beginning to hurt now. I don't even know how I got here, I am such a fuck up." The writer consoled and re-oriented patient as to how she got here and the plan of treatment. Medication for headache will be given. TTS is set up at beside. Counselor to ring in any moment. Pt was informed on what to expect.

## 2015-07-20 NOTE — BH Assessment (Signed)
Faxed referral packet at 5:56 a.m. on 07/20/15 to [facility], [facility], [place], [facility], [place]. [facility], and [facility] K. Darrious Youman, MS  Counselor 07/20/2015 6:00 AM

## 2015-07-20 NOTE — Progress Notes (Signed)
Per psychiatrist and Np, patient psychiatrically stable for discharge home. Patient plans to follow up with current psychiatrist and NP. Patient states she plans to make her own appointments with Dr. Jennelle Human and therapist.   Olga Coaster, LCSW  Clinical Social Work  [facility] [phone]

## 2015-07-20 NOTE — ED Notes (Signed)
Bed: WA33 Expected date:  Expected time:  Means of arrival:  Comments: Hold for 11 

## 2015-07-20 NOTE — Progress Notes (Signed)
[facility] accepted [facility] by dr. Lauris Poag. Rn can call report to [phone].   Olga Coaster, LCSW  Clinical Social Work  [employer] [phone]

## 2015-07-20 NOTE — BHH Counselor (Signed)
Contacted [facility] at 6:05 a.m. Ronaldo Miyamoto regarding fax remission, Contacted Jane at 6:06 am, at [facility] regarding fax remission, Contacted [facility] at 6:09 a.m. Regarding fax remission, contacted [facility] at 6:10 a.m. Regarding fax remission, Contacted Elmyra Ricks at [employer] a.m. [facility] [place] regarding fax remission, contacted [facility] at 6:14 a.m. Regarding fax remission. Justification: Unable to refer patient at this time. Napolean Sia K. Tiburcio Pea, MS  Counselor 07/20/2015 6:20 AM

## 2015-07-20 NOTE — BHH Suicide Risk Assessment (Cosign Needed)
Suicide Risk Assessment  Discharge Assessment   [facility] Discharge Suicide Risk Assessment   Demographic Factors:  Divorced or widowed, Caucasian, Low socioeconomic status and Living alone  Total Time spent with patient: 20 minutes  Musculoskeletal: Strength & Muscle Tone: within normal limits Gait & Station: normal Patient leans: N/A  Psychiatric Specialty Exam:     Blood pressure 137/85, pulse 95, temperature 98.9 F (37.2 C), temperature source Oral, resp. rate 18, SpO2 96 %.There is no weight on file to calculate BMI.  General Appearance: Casual and Fairly Groomed  Eye Contact:: Good  Speech: Clear and Coherent and Normal Rate  Volume: Normal  Mood: Anxious and Depressed  Affect: Congruent, Depressed and Tearful  Thought Process: Coherent, Goal Directed, Intact and Logical  Orientation: Full (Time, Place, and Person)  Thought Content: WDL  Suicidal Thoughts: No  Homicidal Thoughts: No  Memory: Immediate; Good Recent; Good Remote; Good  Judgement: Fair  Insight: Good  Psychomotor Activity: Normal  Concentration: Good  Recall: NA  Fund of Knowledge:Good  Language: Good  Akathisia: NA  Handed: Right  AIMS (if indicated):    Assets: Desire for Improvement  ADL's: Intact  Cognition: WNL            Has this patient used any form of tobacco in the last 30 days? (Cigarettes, Smokeless Tobacco, Cigars, and/or Pipes) N/A  Mental Status Per Nursing Assessment::   On Admission:     Current Mental Status by Physician: NA  Loss Factors: NA  Historical Factors: NA  Risk Reduction Factors:   Sense of responsibility to family and Religious beliefs about death  Continued Clinical Symptoms:  Depression:   Comorbid alcohol abuse/dependence Alcohol/Substance Abuse/Dependencies  Cognitive Features That Contribute To Risk:  Polarized thinking    Suicide Risk:  Minimal: No identifiable suicidal ideation.  Patients  presenting with no risk factors but with morbid ruminations; may be classified as minimal risk based on the severity of the depressive symptoms  Principal Problem: Alcohol use disorder, moderate, dependence Discharge Diagnoses:  Patient Active Problem List   Diagnosis Date Noted  . Alcohol use disorder, moderate, dependence [F10.20] 07/20/2015    Priority: High  . Alcohol intoxication [F10.129]   . Iron deficiency anemia [D50.9] 10/23/2012  . Alcoholism [F10.20] 10/23/2012  . Lower extremity edema [R60.0] 10/23/2012  . Tobacco abuse [Z72.0] 10/23/2012  . ADD (attention deficit disorder) [F90.9] 10/23/2012  . Depression [F32.9]       Plan Of Care/Follow-up recommendations:  Activity:  as tolerated Diet:  regular  Is patient on multiple antipsychotic therapies at discharge:  No   Has Patient had three or more failed trials of antipsychotic monotherapy by history:  No  Recommended Plan for Multiple Antipsychotic Therapies: NA    Amarie Viles C   PMHNP-BC 07/20/2015, 2:16 PM

## 2015-07-20 NOTE — Progress Notes (Signed)
[facility] called requesting information on patient's behavior while in ED and to discuss medical issues. Requested to speak with RN, Clinical research associate provided number for [employer].  Ilean Skill, MSW, LCSW Clinical Social Work, Disposition  07/20/2015 [phone]

## 2015-07-20 NOTE — ED Notes (Signed)
Pt awoken for CIWA assessment. Pt reports having relapse. Denies ever having seizures or withdrawals.

## 2017-11-12 HISTORY — PX: COLONOSCOPY: SHX174

## 2017-11-12 HISTORY — PX: UPPER GI ENDOSCOPY: SHX6162

## 2018-06-25 DIAGNOSIS — M1611 Unilateral primary osteoarthritis, right hip: Secondary | ICD-10-CM | POA: Diagnosis not present

## 2018-06-26 DIAGNOSIS — M25551 Pain in right hip: Secondary | ICD-10-CM | POA: Diagnosis not present

## 2018-07-23 DIAGNOSIS — M1611 Unilateral primary osteoarthritis, right hip: Secondary | ICD-10-CM | POA: Diagnosis not present

## 2018-07-29 ENCOUNTER — Other Ambulatory Visit: Payer: Self-pay | Admitting: Orthopaedic Surgery

## 2018-07-30 ENCOUNTER — Other Ambulatory Visit: Payer: Self-pay | Admitting: Orthopaedic Surgery

## 2018-08-06 ENCOUNTER — Inpatient Hospital Stay (HOSPITAL_COMMUNITY)
Admission: RE | Admit: 2018-08-06 | Discharge: 2018-08-06 | Disposition: A | Discharge status: Home / Self Care | Payer: Self-pay | Source: Ambulatory Visit

## 2018-08-13 ENCOUNTER — Encounter (HOSPITAL_COMMUNITY): Payer: Self-pay

## 2018-08-13 NOTE — Pre-Procedure Instructions (Signed)
Christiane A Cowhig  08/13/2018      [id number] - [place], [place] - [address] AT [facility] [address] [place] [address] Phone: [phone] Fax: [phone]    Your procedure is scheduled on October 15th.  Report to [facility] Admitting at 1045 A.M.  Call this number if you have problems the morning of surgery:  [phone]   Remember:  Do not eat or drink after midnight.    Take these medicines the morning of surgery with A SIP OF WATER   Prozac  7 days prior to surgery STOP taking any Aspirin(unless otherwise instructed by your surgeon), Aleve, Naproxen, Ibuprofen, Motrin, Advil, Goody's, BC's, all herbal medications, fish oil, and all vitamins     Do not wear jewelry, make-up or nail polish.  Do not wear lotions, powders, or perfumes, or deodorant.  Do not shave 48 hours prior to surgery.  Men may shave face and neck.  Do not bring valuables to the hospital.  [facility] is not responsible for any belongings or valuables.  Contacts, dentures or bridgework may not be worn into surgery.  Leave your suitcase in the car.  After surgery it may be brought to your room.  For patients admitted to the hospital, discharge time will be determined by your treatment team.  Patients discharged the day of surgery will not be allowed to drive home.    [facility]- Preparing For Surgery  Before surgery, you can play an important role. Because skin is not sterile, your skin needs to be as free of germs as possible. You can reduce the number of germs on your skin by washing with CHG (chlorahexidine gluconate) Soap before surgery.  CHG is an antiseptic cleaner which kills germs and bonds with the skin to continue killing germs even after washing.    Oral Hygiene is also important to reduce your risk of infection.  Remember - BRUSH YOUR TEETH THE MORNING OF SURGERY WITH YOUR REGULAR TOOTHPASTE  Please do not use if you  have an allergy to CHG or antibacterial soaps. If your skin becomes reddened/irritated stop using the CHG.  Do not shave (including legs and underarms) for at least 48 hours prior to first CHG shower. It is OK to shave your face.  Please follow these instructions carefully.   1. Shower the NIGHT BEFORE SURGERY and the MORNING OF SURGERY with CHG.   2. If you chose to wash your hair, wash your hair first as usual with your normal shampoo.  3. After you shampoo, rinse your hair and body thoroughly to remove the shampoo.  4. Use CHG as you would any other liquid soap. You can apply CHG directly to the skin and wash gently with a scrungie or a clean washcloth.   5. Apply the CHG Soap to your body ONLY FROM THE NECK DOWN.  Do not use on open wounds or open sores. Avoid contact with your eyes, ears, mouth and genitals (private parts). Wash Face and genitals (private parts)  with your normal soap.  6. Wash thoroughly, paying special attention to the area where your surgery will be performed.  7. Thoroughly rinse your body with warm water from the neck down.  8. DO NOT shower/wash with your normal soap after using and rinsing off the CHG Soap.  9. Pat yourself dry with a CLEAN TOWEL.  10. Wear CLEAN PAJAMAS to bed the night before surgery, wear comfortable clothes  the morning of surgery  11. Place CLEAN SHEETS on your bed the night of your first shower and DO NOT SLEEP WITH PETS.    Day of Surgery:  Do not apply any deodorants/lotions.  Please wear clean clothes to the hospital/surgery center.   Remember to brush your teeth WITH YOUR REGULAR TOOTHPASTE.    Please read over the following fact sheets that you were given.

## 2018-08-15 ENCOUNTER — Encounter (HOSPITAL_COMMUNITY): Payer: Self-pay

## 2018-08-15 ENCOUNTER — Encounter (HOSPITAL_COMMUNITY)
Admission: RE | Admit: 2018-08-15 | Discharge: 2018-08-15 | Disposition: A | Discharge status: Home / Self Care | Payer: 59 | Source: Ambulatory Visit | Attending: Orthopaedic Surgery | Admitting: Orthopaedic Surgery

## 2018-08-15 ENCOUNTER — Ambulatory Visit (HOSPITAL_COMMUNITY)
Admission: RE | Admit: 2018-08-15 | Discharge: 2018-08-15 | Disposition: A | Discharge status: Home / Self Care | Payer: 59 | Source: Ambulatory Visit | Attending: Orthopaedic Surgery | Admitting: Orthopaedic Surgery

## 2018-08-15 DIAGNOSIS — R011 Cardiac murmur, unspecified: Secondary | ICD-10-CM | POA: Diagnosis not present

## 2018-08-15 DIAGNOSIS — K449 Diaphragmatic hernia without obstruction or gangrene: Secondary | ICD-10-CM | POA: Insufficient documentation

## 2018-08-15 DIAGNOSIS — Z01818 Encounter for other preprocedural examination: Secondary | ICD-10-CM

## 2018-08-15 DIAGNOSIS — M1611 Unilateral primary osteoarthritis, right hip: Secondary | ICD-10-CM | POA: Insufficient documentation

## 2018-08-15 DIAGNOSIS — D509 Iron deficiency anemia, unspecified: Secondary | ICD-10-CM | POA: Diagnosis not present

## 2018-08-15 DIAGNOSIS — Z87891 Personal history of nicotine dependence: Secondary | ICD-10-CM | POA: Diagnosis not present

## 2018-08-15 DIAGNOSIS — F329 Major depressive disorder, single episode, unspecified: Secondary | ICD-10-CM | POA: Insufficient documentation

## 2018-08-15 DIAGNOSIS — F909 Attention-deficit hyperactivity disorder, unspecified type: Secondary | ICD-10-CM | POA: Diagnosis not present

## 2018-08-15 DIAGNOSIS — F419 Anxiety disorder, unspecified: Secondary | ICD-10-CM | POA: Insufficient documentation

## 2018-08-15 HISTORY — DX: Anxiety disorder, unspecified: F41.9

## 2018-08-15 HISTORY — DX: Attention-deficit hyperactivity disorder, unspecified type: F90.9

## 2018-08-15 HISTORY — DX: Unspecified osteoarthritis, unspecified site: M19.90

## 2018-08-15 LAB — CBC WITH DIFFERENTIAL/PLATELET
Abs Immature Granulocytes: 0 10*3/uL (ref 0.0–0.1)
Basophils Absolute: 0.1 10*3/uL (ref 0.0–0.1)
Basophils Relative: 2 %
EOS PCT: 2 %
Eosinophils Absolute: 0.1 10*3/uL (ref 0.0–0.7)
HCT: 28.8 % — ABNORMAL LOW (ref 36.0–46.0)
Hemoglobin: 8 g/dL — ABNORMAL LOW (ref 12.0–15.0)
Immature Granulocytes: 0 %
LYMPHS PCT: 30 %
Lymphs Abs: 0.9 10*3/uL (ref 0.7–4.0)
MCH: 25.7 pg — AB (ref 26.0–34.0)
MCHC: 27.8 g/dL — AB (ref 30.0–36.0)
MCV: 92.6 fL (ref 78.0–100.0)
MONO ABS: 0.4 10*3/uL (ref 0.1–1.0)
Monocytes Relative: 14 %
Neutro Abs: 1.5 10*3/uL — ABNORMAL LOW (ref 1.7–7.7)
Neutrophils Relative %: 52 %
Platelets: 452 10*3/uL — ABNORMAL HIGH (ref 150–400)
RBC: 3.11 MIL/uL — AB (ref 3.87–5.11)
RDW: 14 % (ref 11.5–15.5)
WBC: 2.8 10*3/uL — ABNORMAL LOW (ref 4.0–10.5)

## 2018-08-15 LAB — SURGICAL PCR SCREEN
MRSA, PCR: NEGATIVE
Staphylococcus aureus: NEGATIVE

## 2018-08-15 LAB — BASIC METABOLIC PANEL
Anion gap: 9 (ref 5–15)
BUN: 7 mg/dL (ref 6–20)
CO2: 20 mmol/L — ABNORMAL LOW (ref 22–32)
Calcium: 9.2 mg/dL (ref 8.9–10.3)
Chloride: 111 mmol/L (ref 98–111)
Creatinine, Ser: 0.81 mg/dL (ref 0.44–1.00)
GFR calc Af Amer: >60 mL/min (ref >60)
GFR calc non Af Amer: >60 mL/min (ref >60)
Glucose, Bld: 103 mg/dL — ABNORMAL HIGH (ref 70–99)
POTASSIUM: 4.1 mmol/L (ref 3.5–5.1)
Sodium: 140 mmol/L (ref 135–145)

## 2018-08-15 LAB — PROTIME-INR
INR: 1.08
PROTHROMBIN TIME: 14 s (ref 11.4–15.2)

## 2018-08-15 LAB — TYPE AND SCREEN
ABO/RH(D): A POS
Antibody Screen: NEGATIVE

## 2018-08-15 LAB — URINALYSIS, ROUTINE W REFLEX MICROSCOPIC
Bilirubin Urine: NEGATIVE
GLUCOSE, UA: NEGATIVE mg/dL
Hgb urine dipstick: NEGATIVE
KETONES UR: NEGATIVE mg/dL
NITRITE: NEGATIVE
PH: 6 (ref 5.0–8.0)
Protein, ur: NEGATIVE mg/dL
Specific Gravity, Urine: 1.02 (ref 1.005–1.030)

## 2018-08-15 LAB — URINALYSIS, MICROSCOPIC (REFLEX)

## 2018-08-15 LAB — APTT: aPTT: 28 seconds (ref 24–36)

## 2018-08-18 ENCOUNTER — Encounter (HOSPITAL_COMMUNITY): Payer: Self-pay | Admitting: Vascular Surgery

## 2018-08-18 NOTE — Progress Notes (Signed)
Anesthesia Chart Review:  Case:  [id number] Date/Time:  08/26/18 1227   Procedure:  TOTAL HIP ARTHROPLASTY ANTERIOR APPROACH (Right )   Anesthesia type:  Spinal   Pre-op diagnosis:  RIGHT HIP DEGENERATIVE JOINT DISEASE   Location:  [facility] ROOM 05 / [facility] OR   Surgeon:  Marcene Corning, MD      DISCUSSION: Patient is a 62 year old female scheduled for the above procedure. History includes former smoker, post-operative N/V, anemia, murmur (not specified). Although not listed in her history, she had an ED encounter in 07/2015 for alcohol intoxication and reported heavy alcohol intake at that time. According to Dr. Nolon Nations 06/25/18 intake sheet, she more currently reports that she she "rarely" uses alcohol.   Anesthesia APP was not consulted for patient evaluation while at APP. Currently unclear details about murmur history, but records indicate that she was evaluated by cardiologist Lance Muss, MD sometime before 2014 for abnormal EKG and murmur. GI and ED notes indicate that cardiac cath was planned for chest pain, but deferred after patient was found to be severely anemic (HGB < 5). I don't see that she ever underwent cardiac cath, so it may be that anemia was contributing to her symptoms. Will attempt to get [facility] records. Preoperative EKG shows possible inferior infarct and new negative/flat T wave changes in V2-3 (which appear non-specific) when compared to 10/23/12 tracing.  Preoperative labs reviewed WBC 2.8, neutrophil # 1.5 K/uL and H/H 8.0/28.8. Currently, I don't have any comparison labs. On 07/19/15 H/H were WNL, but in 10/2012 she was admitted after pre-cardiac cath labs showed a HGB 4.8, MCV 66, ferritin of 5. for iron deficiency anemia. She was transfused with 4 units PRBC and had EGD showing small ulcerated nodular area at the GE junction (duodenum biopsy benign) and normal colonoscopy. Out-patient GI follow-up with Carman Ching, MD arranged.   Reviewed available  information with anesthesiologist Leslye Peer, MD. At his point, recommendations if for patient to have PCP evaluation with review of labs and EKG for determination of any additional orders and/or referrals. Additional input pending cardiology records--if able to get. Olegario Messier at Dr. Nolon Nations office notified of abnormal labs and need for medical evaluation prior to surgery. She will discuss with surgeon.    VS: BP (!) 142/82   Pulse 90   Temp 36.8 C   Resp 20   Ht 5\' 5"  (1.651 m)   Wt 78.8 kg   SpO2 98%   BMI 28.92 kg/m   PROVIDERS: [facility] [employer] location phone number is listed, but staff said she is not seen at that location. 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 Abnormal results called to [facility] at Dr. Nolon Nations office. Currently last LFTs available are from 07/19/15 during ED visit for alcohol intoxication (AST 106, ALT 44). LFTs were not done with her PAT labs as no current heavy alcohol intake reported. (all labs ordered are listed, but only abnormal results are displayed)  Labs Reviewed  BASIC METABOLIC PANEL - Abnormal; Notable for the following components:      Result Value   CO2 20 (*)    Glucose, Bld 103 (*)    All other components within normal limits  CBC WITH DIFFERENTIAL/PLATELET - Abnormal; Notable for the following components:   WBC 2.8 (*)    RBC 3.11 (*)    Hemoglobin 8.0 (*)  HCT 28.8 (*)    MCH 25.7 (*)    MCHC 27.8 (*)    Platelets 452 (*)    Neutro Abs 1.5 (*)    All other components within normal limits  URINALYSIS, ROUTINE W REFLEX MICROSCOPIC - Abnormal; Notable for the following components:   APPearance HAZY (*)    Leukocytes, UA TRACE (*)    All other components within normal limits  URINALYSIS, MICROSCOPIC (REFLEX) - Abnormal; Notable for the following components:   Bacteria, UA FEW (*)    All other components  within normal limits  SURGICAL PCR SCREEN  APTT  PROTIME-INR  TYPE AND SCREEN    IMAGES: CXR 08/15/18: IMPRESSION: Focal hiatal hernia. No edema or consolidation. Stable cardiac silhouette.  EKG: 08/15/18: NSR, cannot rule out inferior infarct (age undetermined).    CV: Requested from the [facility].    Past Medical History:  Diagnosis Date  . ADHD   . Anemia   . Anxiety   . Arthritis   . Depression   . Heart murmur   . PONV (postoperative nausea and vomiting)     Past Surgical History:  Procedure Laterality Date  . CHOLECYSTECTOMY    . COLONOSCOPY  10/25/2012   Procedure: COLONOSCOPY;  Surgeon: Barrie Folk, MD;  Location: [facility] ENDOSCOPY;  Service: Endoscopy;  Laterality: N/A;  . ESOPHAGOGASTRODUODENOSCOPY  10/24/2012   Procedure: ESOPHAGOGASTRODUODENOSCOPY (EGD);  Surgeon: Vertell Novak., MD;  Location: [facility] ENDOSCOPY;  Service: Endoscopy;  Laterality: N/A;    MEDICATIONS: . amphetamine-dextroamphetamine (ADDERALL XR) 20 MG 24 hr capsule  . FLUoxetine (PROZAC) 20 MG capsule  . ibuprofen (ADVIL,MOTRIN) 600 MG tablet  . OVER THE COUNTER MEDICATION   No current facility-administered medications for this encounter.     Velna Ochs [facility]/Anesthesiology Phone [phone] 08/18/2018 3:08 PM

## 2018-08-19 ENCOUNTER — Telehealth (HOSPITAL_COMMUNITY): Payer: Self-pay | Admitting: Vascular Surgery

## 2018-08-19 NOTE — Progress Notes (Signed)
Anesthesia Follow-up: Patient's right THA is being postponed until she can be further evaluated by primary care. She is going to be seen by Deatra James, MD on 09/02/18. I've asked Olegario Messier at Dr. Wadie Lessen office to forward preoperative EKG and labs from 08/15/18.   I also received records from [facility] (under the name of Ikeya Leatherbury). Dr. Eldridge Dace saw her on 10/20/12 for evaluation of progressive DOE over the past several months (had previously been able to hike, but could no longer keep up) and  some chest tightness that felt like a band around her chest over the past few weeks. He did not think he would trust stress test results if they were negative, so decision was make to proceed with cardiac cath. As described in my previous note, her pre-procedure labs showed a HGB of 4.8, so cath was deferred and she underwent GI evaluation instead.   The last notation by Dr. Eldridge Dace was from 10/31/12 in response to patient asking if cath still needed to be pursued. His response indicated he would wait on plans for cardiac cath as long as patient's symptoms improved with treatment of her anemia.   Echo 10/23/12 [facility]): Conclusions: 1. Left ventricular ejection fraction estimated 2D at 65 to 70%. 2.  There is mild concentric left ventricle hypertrophy. 3.  Normal valve function. Trace mitral valve regurgitation. Trivial tricuspid regurgitation.  4.  Impaired relaxation.  Based on these records and my previous discussion with Dr. Mal Amabile, will defer decision for further cardiology evaluation to her PCP. Timing of rescheduled surgery will depend on PCP input and recommendations.  Velna Ochs [facility] Short Stay Center/Anesthesiology Phone [phone] 08/19/2018 9:13 AM

## 2018-08-26 ENCOUNTER — Inpatient Hospital Stay (HOSPITAL_COMMUNITY): Admission: RE | Admit: 2018-08-26 | Payer: 59 | Source: Ambulatory Visit | Admitting: Orthopaedic Surgery

## 2018-08-26 ENCOUNTER — Encounter (HOSPITAL_COMMUNITY): Admission: RE | Payer: Self-pay | Source: Ambulatory Visit

## 2018-08-26 SURGERY — ARTHROPLASTY, HIP, TOTAL, ANTERIOR APPROACH
Anesthesia: Spinal | Laterality: Right

## 2018-09-02 DIAGNOSIS — Z01818 Encounter for other preprocedural examination: Secondary | ICD-10-CM | POA: Diagnosis not present

## 2018-09-02 DIAGNOSIS — M1611 Unilateral primary osteoarthritis, right hip: Secondary | ICD-10-CM | POA: Diagnosis not present

## 2018-09-02 DIAGNOSIS — D509 Iron deficiency anemia, unspecified: Secondary | ICD-10-CM | POA: Diagnosis not present

## 2018-09-23 DIAGNOSIS — D509 Iron deficiency anemia, unspecified: Secondary | ICD-10-CM | POA: Diagnosis not present

## 2018-09-23 DIAGNOSIS — M1611 Unilateral primary osteoarthritis, right hip: Secondary | ICD-10-CM | POA: Diagnosis not present

## 2018-09-30 ENCOUNTER — Ambulatory Visit: Payer: 59 | Admitting: Internal Medicine

## 2018-09-30 VITALS — BP 130/80 | HR 69 | Ht 65.5 in | Wt 172.0 lb

## 2018-09-30 DIAGNOSIS — R079 Chest pain, unspecified: Secondary | ICD-10-CM | POA: Diagnosis not present

## 2018-09-30 MED ORDER — METOPROLOL TARTRATE 100 MG PO TABS: ORAL_TABLET | ORAL | 0 refills | Status: DC

## 2018-09-30 NOTE — Progress Notes (Signed)
Cardiology Office Note:    Date:  09/30/2018   ID:  Laura Chang, DOB 1956-04-30, MRN [id number]  PCP:  [facility]  Cardiologist:  No primary care provider on file.  Electrophysiologist:  None   Referring MD: [facility]   Symptomatic anemia, and abnormal ECG  History of Present Illness:    Laura Chang is a 62 y.o. female with a hx of anemia, ADHD, anxiety, depression, and arthritis of the right hip for which she is seeking surgical management.  We have been asked to comment on her cardiovascular optimization in anticipation of surgery.  She completed preoperative lab work which revealed significant anemia with a hemoglobin of 8, and she has had anemia in the past with her hemoglobin dropping to 4.5 approximately 5 years ago requiring transfusion.  Colonoscopy and EGD could not of identify a source of bleeding at that time.  She has not sought alternate investigation into her anemia per her report, but is supposed to be taking a supplement to assist with maintaining a normal hemoglobin.  She consumes NSAIDs for hip pain, and has recently stopped taking Mobic due to concerns that it caused swelling in her legs.  She describes a sense of feeling facial flushing and sometimes feeling as if it is hard to breathe.  She does not have significant chest pain, chest pressure, palpitations, PND, orthopnea, and occasionally has leg swelling which is improved after stopping Mobic.  She has a history of smoking approximately half a pack per day for 5 to 10 years.  She has a history of alcohol abuse, which she attributes to spending time with certain friends.  Past Medical History:  Diagnosis Date  . ADHD   . Anemia   . Anxiety   . Arthritis   . Depression   . Heart murmur   . PONV (postoperative nausea and vomiting)     Past Surgical History:  Procedure Laterality Date  . CHOLECYSTECTOMY    . COLONOSCOPY  10/25/2012   Procedure: COLONOSCOPY;   Surgeon: Barrie Folk, MD;  Location: [facility] ENDOSCOPY;  Service: Endoscopy;  Laterality: N/A;  . ESOPHAGOGASTRODUODENOSCOPY  10/24/2012   Procedure: ESOPHAGOGASTRODUODENOSCOPY (EGD);  Surgeon: Vertell Novak., MD;  Location: [facility] ENDOSCOPY;  Service: Endoscopy;  Laterality: N/A;    Current Medications: Current Meds  Medication Sig  . amphetamine-dextroamphetamine (ADDERALL XR) 20 MG 24 hr capsule Take 20 mg by mouth daily as needed (for attention).  Marland Kitchen FLUoxetine (PROZAC) 20 MG capsule Take 40 mg by mouth daily.   Marland Kitchen OVER THE COUNTER MEDICATION Take 10 mLs by mouth at bedtime. Floradix Liquid Iron Supplement     Allergies:   Metronidazole and Meloxicam   Social History   Socioeconomic History  . Marital status: Married    Spouse name: Not on file  . Number of children: Not on file  . Years of education: Not on file  . Highest education level: Not on file  Occupational History  . Not on file  Social Needs  . Financial resource strain: Not on file  . Food insecurity:    Worry: Not on file    Inability: Not on file  . Transportation needs:    Medical: Not on file    Non-medical: Not on file  Tobacco Use  . Smoking status: Former Smoker    Last attempt to quit: 08/16/2003    Years since quitting: 15.1  Substance and Sexual Activity  . Alcohol use: Yes  Comment: weekly  . Drug use: No  . Sexual activity: Not on file  Lifestyle  . Physical activity:    Days per week: Not on file    Minutes per session: Not on file  . Stress: Not on file  Relationships  . Social connections:    Talks on phone: Not on file    Gets together: Not on file    Attends religious service: Not on file    Active member of club or organization: Not on file    Attends meetings of clubs or organizations: Not on file    Relationship status: Not on file  Other Topics Concern  . Not on file  Social History Narrative  . Not on file     Family History: The patient's family history includes Atrial  fibrillation in her mother; CAD in her father.  ROS:   Please see the history of present illness.    All other systems reviewed and are negative.  EKGs/Labs/Other Studies Reviewed:    The following studies were reviewed today:  EKG:  EKG is ordered today.  The ekg ordered today demonstrates normal sinus rhythm, left atrial enlargement, ventricular rate 69 bpm.  Recent Labs: 08/15/2018: BUN 7; Creatinine, Ser 0.81; Hemoglobin 8.0; Platelets 452; Potassium 4.1; Sodium 140  Recent Lipid Panel No results found for: CHOL, TRIG, HDL, CHOLHDL, VLDL, LDLCALC, LDLDIRECT  Physical Exam:    VS:  BP 130/80   Pulse 69   Ht 5' 5.5" (1.664 m)   Wt 172 lb (78 kg)   BMI 28.19 kg/m     Wt Readings from Last 3 Encounters:  09/30/18 172 lb (78 kg)  08/15/18 173 lb 12.8 oz (78.8 kg)  10/24/12 175 lb (79.4 kg)     Constitutional: No acute distress Eyes: pupils equally round and reactive to light, sclera non-icteric, normal conjunctiva and lids ENMT: normal dentition, moist mucous membranes Cardiovascular: regular rhythm, normal rate, no murmurs. S1 and S2 normal. Radial pulses normal bilaterally. No jugular venous distention.  Respiratory: clear to auscultation bilaterally GI : normal bowel sounds, soft and nontender. No distention.   MSK: extremities warm, well perfused. No edema.  NEURO: grossly nonfocal exam, moves all extremities. PSYCH: alert and oriented x 3, normal mood and affect.   ASSESSMENT:    1. Chest pain, unspecified type    PLAN:    It is not clear to me why she has anemia, and it would be reasonable for her to visit with a hematologist if there is no clear GI source.  She does have a history of excess alcohol consumption as well as increased NSAID use.  Defer work-up of anemia to her primary care physician and subspecialty.  With regard to her coronary history she was referred for stress testing in the past that was not followed up I also do not have results of an  echocardiogram that was reportedly performed in the past.  Her anemia may be creating a demand situation, therefore defining her coronary anatomy would be optimal.  We can do so with a coronary CTA.  If she has nonobstructive coronary disease, then from a cardiovascular standpoint, there would be no further work-up required.  We will await the results of a coronary CTA to make final recommendations about cardiovascular optimization.  I will see her back in 3 months hopefully with the results of the coronary CT.  Medication Adjustments/Labs and Tests Ordered: Current medicines are reviewed at length with the patient today.  Concerns regarding  medicines are outlined above.  Orders Placed This Encounter  Procedures  . CT CORONARY MORPH W/CTA COR W/SCORE W/CA W/CM &/OR WO/CM  . CT CORONARY FRACTIONAL FLOW RESERVE DATA PREP  . CT CORONARY FRACTIONAL FLOW RESERVE FLUID ANALYSIS  . Basic metabolic panel  . EKG 12-Lead   Meds ordered this encounter  Medications  . metoprolol tartrate (LOPRESSOR) 100 MG tablet    Sig: Take 1 tablet (100mg ) 2 hours prior to your Coronary CTA    Dispense:  1 tablet    Refill:  0    Patient Instructions  Medication Instructions:  Your physician recommends that you continue on your current medications as directed. Please refer to the Current Medication list given to you today.  You will need to take 1 tablet (100mg ) of Metoprolol 2 hours before your Coronary CTA  If you need a refill on your cardiac medications before your next appointment, please call your pharmacy.   Lab work: Your physician recommends that you return for lab work 1-2 days prior to your CTA.   If you have labs (blood work) drawn today and your tests are completely normal, you will receive your results only by: Marland Kitchen MyChart Message (if you have MyChart) OR . A paper copy in the mail If you have any lab test that is abnormal or we need to change your treatment, we will call you to review the  results.  Testing/Procedures: Your physician has requested that you have cardiac CT. Cardiac computed tomography (CT) is a painless test that uses an x-ray machine to take clear, detailed pictures of your heart. For further information please visit [email or web address] Please follow instruction sheet as given.    Follow-Up: At [facility], you and your health needs are our priority.  As part of our continuing mission to provide you with exceptional heart care, we have created designated Provider Care Teams.  These Care Teams include your primary Cardiologist (physician) and Advanced Practice Providers (APPs -  Physician Assistants and Nurse Practitioners) who all work together to provide you with the care you need, when you need it. You will need a follow up appointment in 3 months.   You may see Dr.Jaida Basurto or one of the following Advanced Practice Providers on your designated Care Team:   Theodore Demark, PA-C . Joni Reining, DNP, ANP  Any Other Special Instructions Will Be Listed Below (If Applicable). Please arrive at the [facility] main entrance of [facility] at TBD AM (30-45 minutes prior to test start time)  [facility] [address] [place], [place] [address] [phone]  Proceed to the [facility] Radiology Department (First Floor).  Please follow these instructions carefully (unless otherwise directed):    On the Night Before the Test: . Be sure to Drink plenty of water. . Do not consume any caffeinated/decaffeinated beverages or chocolate 12 hours prior to your test. . Do not take any antihistamines 12 hours prior to your test.   On the Day of the Test: . Drink plenty of water. Do not drink any water within one hour of the test. . Do not eat any food 4 hours prior to the test. . You may take your regular medications prior to the test.  . Take metoprolol (Lopressor) two hours prior to test.       After the Test: . Drink plenty of  water. . After receiving IV contrast, you may experience a mild flushed feeling. This is normal. . On occasion, you may  experience a mild rash up to 24 hours after the test. This is not dangerous. If this occurs, you can take Benadryl 25 mg and increase your fluid intake. . If you experience trouble breathing, this can be serious. If it is severe call 911 IMMEDIATELY. If it is mild, please call our office. . If you take any of these medications: Glipizide/Metformin, Avandament, Glucavance, please do not take 48 hours after completing test.       Signed, Parke PoissonGayatri A Sarrah Fiorenza, MD  09/30/2018 10:39 AM    [facility]

## 2018-09-30 NOTE — Patient Instructions (Addendum)
Medication Instructions:  Your physician recommends that you continue on your current medications as directed. Please refer to the Current Medication list given to you today.  You will need to take 1 tablet (100mg ) of Metoprolol 2 hours before your Coronary CTA  If you need a refill on your cardiac medications before your next appointment, please call your pharmacy.   Lab work: Your physician recommends that you return for lab work 1-2 days prior to your CTA.   If you have labs (blood work) drawn today and your tests are completely normal, you will receive your results only by: Marland Kitchen. MyChart Message (if you have MyChart) OR . A paper copy in the mail If you have any lab test that is abnormal or we need to change your treatment, we will call you to review the results.  Testing/Procedures: Your physician has requested that you have cardiac CT. Cardiac computed tomography (CT) is a painless test that uses an x-ray machine to take clear, detailed pictures of your heart. For further information please visit [email or web address]. Please follow instruction sheet as given.    Follow-Up: At [facility], you and your health needs are our priority.  As part of our continuing mission to provide you with exceptional heart care, we have created designated Provider Care Teams.  These Care Teams include your primary Cardiologist (physician) and Advanced Practice Providers (APPs -  Physician Assistants and Nurse Practitioners) who all work together to provide you with the care you need, when you need it. You will need a follow up appointment in 3 months.   You may see Dr.Acharya or one of the following Advanced Practice Providers on your designated Care Team:   Theodore DemarkRhonda Barrett, PA-C . Joni ReiningKathryn Lawrence, DNP, ANP  Any Other Special Instructions Will Be Listed Below (If Applicable). Please arrive at the [facility] main entrance of [facility] at TBD AM (30-45 minutes prior to test start time)  [facility]  [place] [address] [place], [place] [address] [phone]  Proceed to the [facility] Radiology Department (First Floor).  Please follow these instructions carefully (unless otherwise directed):    On the Night Before the Test: . Be sure to Drink plenty of water. . Do not consume any caffeinated/decaffeinated beverages or chocolate 12 hours prior to your test. . Do not take any antihistamines 12 hours prior to your test.   On the Day of the Test: . Drink plenty of water. Do not drink any water within one hour of the test. . Do not eat any food 4 hours prior to the test. . You may take your regular medications prior to the test.  . Take metoprolol (Lopressor) two hours prior to test.       After the Test: . Drink plenty of water. . After receiving IV contrast, you may experience a mild flushed feeling. This is normal. . On occasion, you may experience a mild rash up to 24 hours after the test. This is not dangerous. If this occurs, you can take Benadryl 25 mg and increase your fluid intake. . If you experience trouble breathing, this can be serious. If it is severe call 911 IMMEDIATELY. If it is mild, please call our office. . If you take any of these medications: Glipizide/Metformin, Avandament, Glucavance, please do not take 48 hours after completing test.

## 2018-10-01 DIAGNOSIS — D509 Iron deficiency anemia, unspecified: Secondary | ICD-10-CM | POA: Diagnosis not present

## 2018-10-01 DIAGNOSIS — K640 First degree hemorrhoids: Secondary | ICD-10-CM | POA: Diagnosis not present

## 2018-10-01 DIAGNOSIS — K5730 Diverticulosis of large intestine without perforation or abscess without bleeding: Secondary | ICD-10-CM | POA: Diagnosis not present

## 2018-10-20 DIAGNOSIS — D509 Iron deficiency anemia, unspecified: Secondary | ICD-10-CM | POA: Diagnosis not present

## 2018-11-11 ENCOUNTER — Telehealth: Payer: Self-pay | Admitting: *Deleted

## 2018-11-11 NOTE — Telephone Encounter (Signed)
Dr Jacques NavyAcharya can you comment on pre op clearance. "Urgent request" for clearance for hip replacement from Dr Ophelia CharterYates.  Coronary CT has not been done yet, it has been ordered, it may still be in pre authorization, I have requested confirmation from them.   Corine ShelterLUKE Piotr Christopher PA-C 11/11/2018 4:26 PM

## 2018-11-11 NOTE — Telephone Encounter (Signed)
   [facility] Pre-operative Risk Assessment    Request for surgical clearance:  1. What type of surgery is being performed?  RIGHT TOTAL HIP REPLACEMENT  2. When is this surgery scheduled? TBD ( URGENT REQUEST)  3. What type of clearance is required (medical clearance vs. Pharmacy clearance to hold med vs. Both)? MEDICAL  4. Are there any medications that need to be held prior to surgery and how long? N/A  5. Practice name and name of physician performing surgery? [facility]; DR PETER DALLDORF   6. What is your office phone number [place]   7.   What is your office fax number [place]  8.   Anesthesia type (None, local, MAC, general) ? SPINAL   Laura Chang 11/11/2018, 3:49 PM  _________________________________________________________________   (provider comments below)

## 2018-11-17 ENCOUNTER — Other Ambulatory Visit: Payer: Self-pay

## 2018-11-17 ENCOUNTER — Telehealth: Payer: Self-pay

## 2018-11-17 DIAGNOSIS — R079 Chest pain, unspecified: Secondary | ICD-10-CM

## 2018-11-17 NOTE — Telephone Encounter (Signed)
Called [facility] and spoke with Dr.Daldorff's scheduler Vickki Muff. Frederik Pear that the pt Cor CTA is scheduled on 11/19/18. We will await the results to determine whether the pt is cleared from a cardiac standpoint for sx. Olegario Messier voiced appreciation for the update.

## 2018-11-17 NOTE — Telephone Encounter (Signed)
Spoke with pt. Pt notified per Dr.acharya she will need a f/u appt after her Cor CTA on 11/19/18 to discuss results and determine pre-op clearance for sx. appt sch with Azalee Course, PA for 11/20/18 @ 1:30pm. Pt aware of appt date and time.

## 2018-11-17 NOTE — Telephone Encounter (Signed)
Per Al Pimple @ [address], the pt Cor CTA is scheduled for 11/19/18. She has spoke with the pt and she is aware of the appt date, time, and location. The pt will come to the [facility] office on 11/18/18 for a Bmet, order in Epic.

## 2018-11-17 NOTE — Telephone Encounter (Signed)
Patient is to have Coronary CTA 11/19/2018 and then see Azalee Course, PA-C 11/20/2018.    I will forward this note to Mr. Lisabeth Devoid so that he can address surgical clearance at the appointment this week.    This note will be removed from the preop pool.    Tereso Newcomer, PA-C    11/17/2018 3:10 PM

## 2018-11-17 NOTE — Telephone Encounter (Signed)
Pt has been contacted several times by scheduling in an attempt to schedule her Cor CTA     Berle Mull 10/24/2018  8:50 AM 10/24/18 850a lmom to call back- cardiac ct NJM   Berle Mull 10/30/2018  8:52 AM 10/30/18 852a lmom to call back - cardiac ct 2nd attempt NJM   Pricilla Holm 11/11/2018  3:09 PM 11-11-18 Lvmom @ 3:08 to calland schedule ct/ 3rd attempt/Letter will me mailed/saf  Pt has not responded to voicemails or letter mailed. Per Dr.Acharya this pt will need the Cor CTA before clearance can be granted, update fwd to our pre-op clinic.

## 2018-11-18 ENCOUNTER — Encounter (HOSPITAL_COMMUNITY): Payer: Self-pay | Admitting: Emergency Medicine

## 2018-11-18 ENCOUNTER — Telehealth (HOSPITAL_COMMUNITY): Payer: Self-pay | Admitting: Emergency Medicine

## 2018-11-18 DIAGNOSIS — R079 Chest pain, unspecified: Secondary | ICD-10-CM | POA: Diagnosis not present

## 2018-11-18 MED ORDER — METOPROLOL TARTRATE 100 MG PO TABS: ORAL_TABLET | ORAL | 0 refills | Status: DC

## 2018-11-18 NOTE — Telephone Encounter (Signed)
Pt returning phone call-  pt verbalizes understanding of appt date/time, parking situation and where to check in, pre-test NPO status and medications ordered, and verified current allergies; name and call back number provided for further questions should they arise Aviv Rota RN Navigator Cardiac Imaging [phone] 

## 2018-11-18 NOTE — Telephone Encounter (Signed)
Attempted to contact patient to prep her for her cardiac CT. No answer on primary phone, unable to leave message as mailbox is full. Will try again later. Rockwell AlexandriaSara Rollyn Scialdone RN Navigator Cardiac Imaging [phone]

## 2018-11-18 NOTE — Telephone Encounter (Signed)
Will do. Thank you

## 2018-11-18 NOTE — Telephone Encounter (Signed)
Patient sent duplicate message. Rx has been sent

## 2018-11-19 ENCOUNTER — Ambulatory Visit (HOSPITAL_COMMUNITY)
Admission: RE | Admit: 2018-11-19 | Discharge: 2018-11-19 | Disposition: A | Discharge status: Home / Self Care | Payer: 59 | Source: Ambulatory Visit | Attending: Internal Medicine | Admitting: Internal Medicine

## 2018-11-19 ENCOUNTER — Ambulatory Visit (HOSPITAL_COMMUNITY): Admission: RE | Admit: 2018-11-19 | Payer: 59 | Source: Ambulatory Visit

## 2018-11-19 DIAGNOSIS — R079 Chest pain, unspecified: Secondary | ICD-10-CM | POA: Diagnosis not present

## 2018-11-19 LAB — BASIC METABOLIC PANEL
BUN/Creatinine Ratio: 32 — ABNORMAL HIGH (ref 12–28)
BUN: 20 mg/dL (ref 8–27)
CALCIUM: 9 mg/dL (ref 8.7–10.3)
CO2: 18 mmol/L — AB (ref 20–29)
CREATININE: 0.63 mg/dL (ref 0.57–1.00)
Chloride: 104 mmol/L (ref 96–106)
GFR calc Af Amer: 112 mL/min/{1.73_m2} (ref >59)
GFR, EST NON AFRICAN AMERICAN: 97 mL/min/{1.73_m2} (ref >59)
GLUCOSE: 108 mg/dL — AB (ref 65–99)
POTASSIUM: 4.3 mmol/L (ref 3.5–5.2)
SODIUM: 140 mmol/L (ref 134–144)

## 2018-11-19 MED ORDER — IOHEXOL 300 MG/ML  SOLN
100.0000 mL | Freq: Once | INTRAMUSCULAR | Status: AC | PRN
  Administered 2018-11-19: 100 mL via INTRAVENOUS

## 2018-11-19 MED ORDER — NITROGLYCERIN 0.4 MG SL SUBL
0.8000 mg | SUBLINGUAL_TABLET | Freq: Once | SUBLINGUAL | Status: AC
  Administered 2018-11-19: 0.8 mg via SUBLINGUAL
  Filled 2018-11-19: qty 25

## 2018-11-19 MED ORDER — NITROGLYCERIN 0.4 MG SL SUBL
SUBLINGUAL_TABLET | SUBLINGUAL | Status: AC
  Filled 2018-11-19: qty 2

## 2018-11-20 ENCOUNTER — Ambulatory Visit: Payer: Self-pay | Admitting: Psychiatry

## 2018-11-20 ENCOUNTER — Encounter: Payer: Self-pay | Admitting: Medical

## 2018-11-20 ENCOUNTER — Ambulatory Visit: Payer: 59 | Admitting: Medical

## 2018-11-20 VITALS — BP 126/80 | HR 61 | Ht 65.0 in | Wt 173.0 lb

## 2018-11-20 DIAGNOSIS — Z01810 Encounter for preprocedural cardiovascular examination: Secondary | ICD-10-CM | POA: Diagnosis not present

## 2018-11-20 DIAGNOSIS — D649 Anemia, unspecified: Secondary | ICD-10-CM | POA: Diagnosis not present

## 2018-11-20 NOTE — Progress Notes (Signed)
Cardiology Office Note   Date:  11/20/2018   ID:  [name], DOB July 18, 1956, MRN [id number]  PCP:  Deatra James, MD  Cardiologist:  Parke Poisson, MD EP: None  Chief Complaint  Patient presents with  . Pre-op Exam      History of Present Illness: Laura Chang is a 63 y.o. female with a PMH of anemia, ADHD, anxiety, depression, arthritis, former tobacco and etoh abuse, who presents for preoperative assessment.  She was seen by Dr. Jacques Navy 09/30/18 to during initial evaluation for preoperative assessment. She denied cardiac complaints at that time. She was noted to have anemia on recent lab work with Hgb 8. Given anemia, family history of early onset CAD, and former tobacco abuse, decision was made to pursue a coronary CTA to assess coronary anatomy. Coronary CTA 11/19/18 revealed a calcium score of 0 with no evidence of CAD.   She returns today for ongoing preoperative assessment. Again, she has no cardiac complaints - CP, SOB, DOE, palpitations, dizziness, lightheadedness, syncope, orthopnea, PND, or LE edema. Her only complaint at this time is left hip pain for which she is hopeful to have surgery on soon. Hip pain has limited her mobility significantly over the past several months. She states she underwent an EGD/C-scope within the past month to work up her anemia and reports normal results. She plans to see a hematologist to further evaluate anemia.     Past Medical History:  Diagnosis Date  . ADHD   . Anemia   . Anxiety   . Arthritis   . Depression   . Heart murmur   . PONV (postoperative nausea and vomiting)     Past Surgical History:  Procedure Laterality Date  . CHOLECYSTECTOMY    . COLONOSCOPY  10/25/2012   Procedure: COLONOSCOPY;  Surgeon: Barrie Folk, MD;  Location: [facility] ENDOSCOPY;  Service: Endoscopy;  Laterality: N/A;  . ESOPHAGOGASTRODUODENOSCOPY  10/24/2012   Procedure: ESOPHAGOGASTRODUODENOSCOPY (EGD);  Surgeon: Vertell Novak., MD;  Location:  [facility] ENDOSCOPY;  Service: Endoscopy;  Laterality: N/A;     Current Outpatient Medications  Medication Sig Dispense Refill  . amphetamine-dextroamphetamine (ADDERALL XR) 20 MG 24 hr capsule Take 20 mg by mouth daily as needed (for attention).    Marland Kitchen FLUoxetine (PROZAC) 20 MG capsule Take 40 mg by mouth daily.     . metoprolol tartrate (LOPRESSOR) 100 MG tablet Take 1 tablet (100mg ) 2 hours prior to your Coronary CTA 1 tablet 0  . OVER THE COUNTER MEDICATION Take 10 mLs by mouth at bedtime. Floradix Liquid Iron Supplement     No current facility-administered medications for this visit.     Allergies:   Metronidazole and Meloxicam    Social History:  The patient  reports that she quit smoking about 15 years ago. She has never used smokeless tobacco. She reports current alcohol use. She reports that she does not use drugs.   Family History:  The patient's family history includes Atrial fibrillation in her mother; CAD in her father.    ROS:  Please see the history of present illness.   Otherwise, review of systems are positive for none.   All other systems are reviewed and negative.    PHYSICAL EXAM: VS:  BP 126/80   Pulse 61   Ht 5\' 5"  (1.651 m)   Wt 173 lb (78.5 kg)   BMI 28.79 kg/m  , BMI Body mass index is 28.79 kg/m. GEN: Well nourished, well developed, in  no acute distress HEENT: sclera anicteric Neck: no JVD, carotid bruits, or masses Cardiac: RRR; no murmurs, rubs, or gallops,no edema  Respiratory:  clear to auscultation bilaterally, normal work of breathing GI: soft, nontender, nondistended, + BS MS: no deformity or atrophy Skin: warm and dry, no rash Neuro:  Strength and sensation are intact Psych: euthymic mood, full affect   EKG:  EKG is not ordered today.   Recent Labs: 08/15/2018: Hemoglobin 8.0; Platelets 452 11/18/2018: BUN 20; Creatinine, Ser 0.63; Potassium 4.3; Sodium 140    Lipid Panel No results found for: CHOL, TRIG, HDL, CHOLHDL, VLDL, LDLCALC,  LDLDIRECT    Wt Readings from Last 3 Encounters:  11/20/18 173 lb (78.5 kg)  09/30/18 172 lb (78 kg)  08/15/18 173 lb 12.8 oz (78.8 kg)      Other studies Reviewed: Additional studies/ records that were reviewed today include:   Coronary CTA 11/19/18: 1. Coronary calcium score of 0. This was 0 percentile for age and sex matched control.  2. Normal coronary origin with right dominance.  3. No evidence of CAD, CADRADS = 0.    ASSESSMENT AND PLAN:  1. Preoperative assessment: patient is without cardiac complaints, though has been significantly limited in mobility by left hip pain over the past several months. She underwent a coronary CTA 11/19/18 which revealed a calcium score of 0 and no evidence of CAD.  - Based on normal coronary CTA she is deemed to be at an acceptable risk for surgery with no further cardiac work-up prior to hip surgery - Will route this note to Dr. Jerl Santos to serve as clearance  2. Anemia: Hgb 8 08/2018. She states she had an EGD/C-scope in the past month which was reportedly without abnormalities. She plans to see a hematologist for further evaluation - Continue management/ work-up per PCP/ hematology    Current medicines are reviewed at length with the patient today.  The patient does not have concerns regarding medicines.  The following changes have been made:  no change  Labs/ tests ordered today include: None No orders of the defined types were placed in this encounter.    Disposition:   FU with Dr. Jacques Navy as needed going forward.   Signed, Beatriz Stallion, PA-C  11/20/2018 3:36 PM

## 2018-11-20 NOTE — Patient Instructions (Signed)
Medication Instructions:  No changes If you need a refill on your cardiac medications before your next appointment, please call your pharmacy.   Lab work: NONE If you have labs (blood work) drawn today and your tests are completely normal, you will receive your results only by: Marland Kitchen MyChart Message (if you have MyChart) OR . A paper copy in the mail If you have any lab test that is abnormal or we need to change your treatment, we will call you to review the results.  Testing/Procedures: NONE  Follow-Up: At [facility], you and your health needs are our priority.  As part of our continuing mission to provide you with exceptional heart care, we have created designated Provider Care Teams.  These Care Teams include your primary Cardiologist (physician) and Advanced Practice Providers (APPs -  Physician Assistants and Nurse Practitioners) who all work together to provide you with the care you need, when you need it. You will need a follow up appointment AS NEEDED.  Should you need an appointment, you may see Parke Poisson, MD or one of the following Advanced Practice Providers on your designated Care Team:   Theodore Demark, PA-C . Joni Reining, DNP, ANP  Any Other Special Instructions Will Be Listed Below (If Applicable). Continue to follow up with PCP for anemia

## 2018-11-21 ENCOUNTER — Ambulatory Visit: Payer: 59 | Admitting: Psychiatry

## 2018-11-21 ENCOUNTER — Encounter: Payer: Self-pay | Admitting: Psychiatry

## 2018-11-21 VITALS — BP 118/70 | HR 70

## 2018-11-21 DIAGNOSIS — F331 Major depressive disorder, recurrent, moderate: Secondary | ICD-10-CM

## 2018-11-21 DIAGNOSIS — F900 Attention-deficit hyperactivity disorder, predominantly inattentive type: Secondary | ICD-10-CM | POA: Diagnosis not present

## 2018-11-21 DIAGNOSIS — F411 Generalized anxiety disorder: Secondary | ICD-10-CM | POA: Diagnosis not present

## 2018-11-21 MED ORDER — AMPHETAMINE-DEXTROAMPHET ER 20 MG PO CP24: 20.0000 mg | ORAL_CAPSULE | Freq: Two times a day (BID) | ORAL | 0 refills | Status: DC

## 2018-11-21 MED ORDER — LORAZEPAM 1 MG PO TABS: 1.0000 mg | ORAL_TABLET | Freq: Three times a day (TID) | ORAL | 0 refills | Status: DC

## 2018-11-21 NOTE — Progress Notes (Signed)
Laura Chang [id number] 11/10/1956 63 y.o.  Subjective:   Patient ID:  Laura Chang is a 63 y.o. (DOB September 04, 1956) female.  Chief Complaint:  Chief Complaint  Patient presents with  . Follow-up    Medication Mangement    HPI Laura Chang  Laura Chang presents to the office today for follow-up of depression and anxiety and ADD.  Feels better re mood except anxious.  Some worry over her health.   Chronic pain in hip without meds.  Frustrating  Pending surgery on hip very soon.  Doesn't feel tired from anemia.  But does feel down and cognitively dull.  Hematology pending.  Sleep ok except for pain.  Function impaired by hip pain.  Discouraged over health issues.    Cutting back on alcohol.  Asks for rare prn Bz just occasional bc sometimes drinks to cope with anxiety.  Knows not to combine Bz and alcohol.  Review of Systems:  Review of Systems  Musculoskeletal: Positive for arthralgias, back pain and joint swelling.  Neurological: Negative for tremors and weakness.  Psychiatric/Behavioral: Negative for agitation, behavioral problems, confusion, decreased concentration, dysphoric mood, hallucinations, self-injury, sleep disturbance and suicidal ideas. The patient is nervous/anxious. The patient is not hyperactive.     Medications: I have reviewed the patient's current medications.  Current Outpatient Medications  Medication Sig Dispense Refill  . amphetamine-dextroamphetamine (ADDERALL XR) 20 MG 24 hr capsule Take 20 mg by mouth 2 (two) times daily.     Marland Kitchen FLUoxetine (PROZAC) 20 MG capsule Take 60 mg by mouth daily.     Marland Kitchen OVER THE COUNTER MEDICATION Take 10 mLs by mouth at bedtime. Floradix Liquid Iron Supplement    . metoprolol tartrate (LOPRESSOR) 100 MG tablet Take 1 tablet (100mg ) 2 hours prior to your Coronary CTA (Patient not taking: Reported on 11/21/2018) 1 tablet 0   No current facility-administered medications for this visit.     Medication Side Effects:  None  Allergies:  Allergies  Allergen Reactions  . Metronidazole Shortness Of Breath and Other (See Comments)    Heart pounding  . Meloxicam     Ankle swell    Past Medical History:  Diagnosis Date  . ADHD   . Anemia   . Anxiety   . Arthritis   . Depression   . Heart murmur   . PONV (postoperative nausea and vomiting)     Family History  Problem Relation Age of Onset  . Atrial fibrillation Mother   . CAD Father     Social History   Socioeconomic History  . Marital status: Married    Spouse name: Not on file  . Number of children: Not on file  . Years of education: Not on file  . Highest education level: Not on file  Occupational History  . Not on file  Social Needs  . Financial resource strain: Not on file  . Food insecurity:    Worry: Not on file    Inability: Not on file  . Transportation needs:    Medical: Not on file    Non-medical: Not on file  Tobacco Use  . Smoking status: Former Smoker    Last attempt to quit: 08/16/2003    Years since quitting: 15.2  . Smokeless tobacco: Never Used  Substance and Sexual Activity  . Alcohol use: Yes    Comment: weekly  . Drug use: No  . Sexual activity: Not on file  Lifestyle  . Physical activity:    Days per week:  Not on file    Minutes per session: Not on file  . Stress: Not on file  Relationships  . Social connections:    Talks on phone: Not on file    Gets together: Not on file    Attends religious service: Not on file    Active member of club or organization: Not on file    Attends meetings of clubs or organizations: Not on file    Relationship status: Not on file  . Intimate partner violence:    Fear of current or ex partner: Not on file    Emotionally abused: Not on file    Physically abused: Not on file    Forced sexual activity: Not on file  Other Topics Concern  . Not on file  Social History Narrative  . Not on file    Past Medical History, Surgical history, Social history, and Family  history were reviewed and updated as appropriate.   Please see review of systems for further details on the patient's review from today.   Objective:   Physical Exam:  BP 118/70 (BP Location: Left Arm)   Pulse 70   Physical Exam Constitutional:      General: She is not in acute distress.    Appearance: She is well-developed.  Musculoskeletal:        General: No deformity.  Neurological:     Mental Status: She is alert and oriented to person, place, and time.     Motor: No tremor.     Coordination: Coordination normal.     Gait: Gait abnormal.  Psychiatric:        Attention and Perception: Attention and perception normal.        Mood and Affect: Mood is anxious. Mood is not depressed. Affect is not labile, blunt, angry or inappropriate.        Speech: Speech normal.        Behavior: Behavior normal.        Thought Content: Thought content normal. Thought content does not include homicidal or suicidal ideation. Thought content does not include homicidal or suicidal plan.        Cognition and Memory: Cognition normal.        Judgment: Judgment normal.     Comments: Insight intact. No auditory or visual hallucinations. No delusions.      Lab Review:     Component Value Date/Time   NA 140 11/18/2018 1544   K 4.3 11/18/2018 1544   CL 104 11/18/2018 1544   CO2 18 (L) 11/18/2018 1544   GLUCOSE 108 (H) 11/18/2018 1544   GLUCOSE 103 (H) 08/15/2018 1134   BUN 20 11/18/2018 1544   CREATININE 0.63 11/18/2018 1544   CALCIUM 9.0 11/18/2018 1544   PROT 7.7 07/19/2015 2208   ALBUMIN 4.4 07/19/2015 2208   AST 106 (H) 07/19/2015 2208   ALT 44 07/19/2015 2208   ALKPHOS 60 07/19/2015 2208   BILITOT 0.6 07/19/2015 2208   GFRNONAA 97 11/18/2018 1544   GFRAA 112 11/18/2018 1544       Component Value Date/Time   WBC 2.8 (L) 08/15/2018 1134   RBC 3.11 (L) 08/15/2018 1134   HGB 8.0 (L) 08/15/2018 1134   HCT 28.8 (L) 08/15/2018 1134   PLT 452 (H) 08/15/2018 1134   MCV 92.6  08/15/2018 1134   MCH 25.7 (L) 08/15/2018 1134   MCHC 27.8 (L) 08/15/2018 1134   RDW 14.0 08/15/2018 1134   LYMPHSABS 0.9 08/15/2018 1134   MONOABS 0.4 08/15/2018  1134   EOSABS 0.1 08/15/2018 1134   BASOSABS 0.1 08/15/2018 1134    No results found for: POCLITH, LITHIUM   No results found for: PHENYTOIN, PHENOBARB, VALPROATE, CBMZ   .res Assessment: Plan:    Attention deficit hyperactivity disorder (ADHD), predominantly inattentive type  Generalized anxiety disorder   Discussed potential benefits, risks, and side effects of stimulants with patient to include increased heart rate, palpitations, insomnia, increased anxiety, increased irritability, or decreased appetite.  Instructed patient to contact office if experiencing any significant tolerability issues.  Self care and CBT on age issues and health stress.  Work on self care.  Watch alcohol, "i'm trying".  Ok a few lorazepam prn. Not with alcohol.  Emphasized the importance of addressing the anemia. Benefit from meds.  No changes indicated.  Satisfied with ADD meds.  FU 6 mos  Meredith Staggersarey Cottle, MD, DFAPA     Please see After Visit Summary for patient specific instructions.  Future Appointments  Date Time Provider Department Center  12/31/2018  2:20 PM Parke PoissonAcharya, Gayatri A, MD [facility]    No orders of the defined types were placed in this encounter.     -------------------------------

## 2018-11-27 ENCOUNTER — Encounter: Payer: Self-pay | Admitting: Hematology

## 2018-11-27 ENCOUNTER — Telehealth: Payer: Self-pay | Admitting: Hematology

## 2018-11-27 NOTE — Telephone Encounter (Signed)
Received a new hem referral from Dr. Wynelle Link for iron deficiency anemia. Pt has been cld and scheduled to see Dr. Candise Che on 3/11 at 1pm. Letter mailed.

## 2018-12-03 DIAGNOSIS — M1611 Unilateral primary osteoarthritis, right hip: Secondary | ICD-10-CM | POA: Diagnosis not present

## 2018-12-04 ENCOUNTER — Other Ambulatory Visit: Payer: Self-pay | Admitting: Orthopaedic Surgery

## 2018-12-11 NOTE — Pre-Procedure Instructions (Signed)
Laura Chang  12/11/2018      [id number] - [place], [place] - [address] AT [facility] [address] [place] [address] Phone: [phone] Fax: [phone]    Your procedure is scheduled on February 11  Report to [facility] Admitting at 1320 P.M.  Call this number if you have problems the morning of surgery:  [phone]   Remember:  Do not eat or drink after midnight.    Take these medicines the morning of surgery with A SIP OF WATER  acetaminophen (TYLENOL)  LORazepam (ATIVAN) metoprolol tartrate (LOPRESSOR)   7 days prior to surgery STOP taking any Aspirin (unless otherwise instructed by your surgeon), Aleve, Naproxen, Ibuprofen, Motrin, Advil, Goody's, BC's, all herbal medications, fish oil, and all vitamins.     Do not wear jewelry, make-up or nail polish.  Do not wear lotions, powders, or perfumes, or deodorant.  Do not shave 48 hours prior to surgery.    Do not bring valuables to the hospital.  [facility] is not responsible for any belongings or valuables.  Contacts, dentures or bridgework may not be worn into surgery.  Leave your suitcase in the car.  After surgery it may be brought to your room.  For patients admitted to the hospital, discharge time will be determined by your treatment team.  Patients discharged the day of surgery will not be allowed to drive home.    Special instructions:   [place]- Preparing For Surgery  Before surgery, you can play an important role. Because skin is not sterile, your skin needs to be as free of germs as possible. You can reduce the number of germs on your skin by washing with CHG (chlorahexidine gluconate) Soap before surgery.  CHG is an antiseptic cleaner which kills germs and bonds with the skin to continue killing germs even after washing.    Oral Hygiene is also important to reduce your risk of infection.  Remember - BRUSH YOUR TEETH THE MORNING  OF SURGERY WITH YOUR REGULAR TOOTHPASTE  Please do not use if you have an allergy to CHG or antibacterial soaps. If your skin becomes reddened/irritated stop using the CHG.  Do not shave (including legs and underarms) for at least 48 hours prior to first CHG shower. It is OK to shave your face.  Please follow these instructions carefully.   1. Shower the NIGHT BEFORE SURGERY and the MORNING OF SURGERY with CHG.   2. If you chose to wash your hair, wash your hair first as usual with your normal shampoo.  3. After you shampoo, rinse your hair and body thoroughly to remove the shampoo.  4. Use CHG as you would any other liquid soap. You can apply CHG directly to the skin and wash gently with a scrungie or a clean washcloth.   5. Apply the CHG Soap to your body ONLY FROM THE NECK DOWN.  Do not use on open wounds or open sores. Avoid contact with your eyes, ears, mouth and genitals (private parts). Wash Face and genitals (private parts)  with your normal soap.  6. Wash thoroughly, paying special attention to the area where your surgery will be performed.  7. Thoroughly rinse your body with warm water from the neck down.  8. DO NOT shower/wash with your normal soap after using and rinsing off the CHG Soap.  9. Pat yourself dry with a CLEAN TOWEL.  10. Wear CLEAN PAJAMAS to bed  the night before surgery, wear comfortable clothes the morning of surgery  11. Place CLEAN SHEETS on your bed the night of your first shower and DO NOT SLEEP WITH PETS.    Day of Surgery:  Do not apply any deodorants/lotions.  Please wear clean clothes to the hospital/surgery center.   Remember to brush your teeth WITH YOUR REGULAR TOOTHPASTE.    Please read over the following fact sheets that you were given.

## 2018-12-12 ENCOUNTER — Encounter (HOSPITAL_COMMUNITY): Payer: Self-pay

## 2018-12-12 ENCOUNTER — Other Ambulatory Visit: Payer: Self-pay

## 2018-12-12 ENCOUNTER — Encounter (HOSPITAL_COMMUNITY)
Admission: RE | Admit: 2018-12-12 | Discharge: 2018-12-12 | Disposition: A | Discharge status: Home / Self Care | Payer: 59 | Source: Ambulatory Visit | Attending: Orthopaedic Surgery | Admitting: Orthopaedic Surgery

## 2018-12-12 ENCOUNTER — Other Ambulatory Visit: Payer: Self-pay | Admitting: Orthopaedic Surgery

## 2018-12-12 DIAGNOSIS — Z01812 Encounter for preprocedural laboratory examination: Secondary | ICD-10-CM | POA: Diagnosis present

## 2018-12-12 LAB — URINALYSIS, ROUTINE W REFLEX MICROSCOPIC
Bilirubin Urine: NEGATIVE
Glucose, UA: NEGATIVE mg/dL
Ketones, ur: NEGATIVE mg/dL
Nitrite: NEGATIVE
Protein, ur: NEGATIVE mg/dL
Specific Gravity, Urine: 1.025 (ref 1.005–1.030)
pH: 5.5 (ref 5.0–8.0)

## 2018-12-12 LAB — BASIC METABOLIC PANEL
Anion gap: 11 (ref 5–15)
BUN: 14 mg/dL (ref 8–23)
CALCIUM: 9.2 mg/dL (ref 8.9–10.3)
CO2: 22 mmol/L (ref 22–32)
Chloride: 110 mmol/L (ref 98–111)
Creatinine, Ser: 0.91 mg/dL (ref 0.44–1.00)
GFR calc Af Amer: >60 mL/min (ref >60)
GFR calc non Af Amer: >60 mL/min (ref >60)
Glucose, Bld: 102 mg/dL — ABNORMAL HIGH (ref 70–99)
Potassium: 3.9 mmol/L (ref 3.5–5.1)
Sodium: 143 mmol/L (ref 135–145)

## 2018-12-12 LAB — CBC WITH DIFFERENTIAL/PLATELET
Abs Immature Granulocytes: 0.01 10*3/uL (ref 0.00–0.07)
Basophils Absolute: 0 10*3/uL (ref 0.0–0.1)
Basophils Relative: 1 %
Eosinophils Absolute: 0.1 10*3/uL (ref 0.0–0.5)
Eosinophils Relative: 1 %
HCT: 27.4 % — ABNORMAL LOW (ref 36.0–46.0)
Hemoglobin: 7.5 g/dL — ABNORMAL LOW (ref 12.0–15.0)
Immature Granulocytes: 0 %
Lymphocytes Relative: 17 %
Lymphs Abs: 1 10*3/uL (ref 0.7–4.0)
MCH: 22.1 pg — AB (ref 26.0–34.0)
MCHC: 27.4 g/dL — ABNORMAL LOW (ref 30.0–36.0)
MCV: 80.8 fL (ref 80.0–100.0)
MONO ABS: 0.6 10*3/uL (ref 0.1–1.0)
Monocytes Relative: 11 %
Neutro Abs: 4.1 10*3/uL (ref 1.7–7.7)
Neutrophils Relative %: 70 %
Platelets: 419 10*3/uL — ABNORMAL HIGH (ref 150–400)
RBC: 3.39 MIL/uL — ABNORMAL LOW (ref 3.87–5.11)
RDW: 15.5 % (ref 11.5–15.5)
WBC: 5.8 10*3/uL (ref 4.0–10.5)
nRBC: 0 % (ref 0.0–0.2)

## 2018-12-12 LAB — APTT: aPTT: 26 seconds (ref 24–36)

## 2018-12-12 LAB — URINALYSIS, MICROSCOPIC (REFLEX)

## 2018-12-12 LAB — SURGICAL PCR SCREEN
MRSA, PCR: POSITIVE — AB
STAPHYLOCOCCUS AUREUS: POSITIVE — AB

## 2018-12-12 LAB — PROTIME-INR
INR: 1.04
Prothrombin Time: 13.5 seconds (ref 11.4–15.2)

## 2018-12-12 NOTE — Progress Notes (Signed)
Spoke with Olegario Messier with dr Nolon Nations office about patients Hemoglobin

## 2018-12-12 NOTE — Progress Notes (Signed)
PCP - Deatra James Cardiologist -  Berdine Addison  Chest x-ray - 08/15/18 EKG - 09/30/18 Stress Test - denie ECHO - denies Cardiac Cath - denies  Anesthesia review: yes, allison note 10/4 and 10/8, clearance note in epic   Patient denies shortness of breath, fever, cough and chest pain at PAT appointment   Patient verbalized understanding of instructions that were given to them at the PAT appointment. Patient was also instructed that they will need to review over the PAT instructions again at home before surgery.

## 2018-12-12 NOTE — Progress Notes (Signed)
[phone], left voicemail with patients permission about results and where to pick up medication

## 2018-12-12 NOTE — Progress Notes (Signed)
Called dr. Jerl Santos office left message for Laura Chang, pateint's is positve for MRSA and request for antibiotic changed to vancomycin

## 2018-12-16 ENCOUNTER — Telehealth: Payer: Self-pay | Admitting: Hematology

## 2018-12-16 NOTE — Telephone Encounter (Signed)
Pt cld to reschedule appt to see Dr. Candise Che on 2/13 at 11am. Pt aware to arrive 30 minutes early.

## 2018-12-16 NOTE — Telephone Encounter (Signed)
Called patient per 02/03 voicemail log.  Patient was a new patient and wanted to reschedule her appt, so I transferred the call to new patient referral scheduling.

## 2018-12-23 ENCOUNTER — Inpatient Hospital Stay: Admit: 2018-12-23 | Payer: 59 | Admitting: Orthopaedic Surgery

## 2018-12-23 SURGERY — ARTHROPLASTY, HIP, TOTAL,POSTERIOR APPROACH
Anesthesia: Spinal | Laterality: Right

## 2018-12-25 ENCOUNTER — Other Ambulatory Visit: Payer: Self-pay

## 2018-12-25 ENCOUNTER — Telehealth: Payer: Self-pay | Admitting: *Deleted

## 2018-12-25 ENCOUNTER — Observation Stay (HOSPITAL_COMMUNITY)
Admission: EM | Admit: 2018-12-25 | Discharge: 2018-12-27 | Disposition: A | Discharge status: Home / Self Care | Payer: 59 | Attending: Internal Medicine | Admitting: Internal Medicine

## 2018-12-25 ENCOUNTER — Telehealth: Payer: Self-pay | Admitting: Hematology

## 2018-12-25 ENCOUNTER — Encounter (HOSPITAL_COMMUNITY): Payer: Self-pay

## 2018-12-25 ENCOUNTER — Inpatient Hospital Stay: Payer: 59

## 2018-12-25 ENCOUNTER — Inpatient Hospital Stay: Payer: 59 | Attending: Hematology | Admitting: Hematology

## 2018-12-25 VITALS — BP 141/71 | HR 89 | Temp 98.4°F | Resp 18 | Ht 65.0 in | Wt 174.3 lb

## 2018-12-25 DIAGNOSIS — K449 Diaphragmatic hernia without obstruction or gangrene: Secondary | ICD-10-CM | POA: Diagnosis not present

## 2018-12-25 DIAGNOSIS — R011 Cardiac murmur, unspecified: Secondary | ICD-10-CM | POA: Insufficient documentation

## 2018-12-25 DIAGNOSIS — D509 Iron deficiency anemia, unspecified: Principal | ICD-10-CM | POA: Insufficient documentation

## 2018-12-25 DIAGNOSIS — Z79899 Other long term (current) drug therapy: Secondary | ICD-10-CM | POA: Diagnosis not present

## 2018-12-25 DIAGNOSIS — F909 Attention-deficit hyperactivity disorder, unspecified type: Secondary | ICD-10-CM | POA: Diagnosis not present

## 2018-12-25 DIAGNOSIS — F329 Major depressive disorder, single episode, unspecified: Secondary | ICD-10-CM | POA: Insufficient documentation

## 2018-12-25 DIAGNOSIS — Z87891 Personal history of nicotine dependence: Secondary | ICD-10-CM | POA: Insufficient documentation

## 2018-12-25 DIAGNOSIS — D649 Anemia, unspecified: Secondary | ICD-10-CM

## 2018-12-25 DIAGNOSIS — M1990 Unspecified osteoarthritis, unspecified site: Secondary | ICD-10-CM | POA: Diagnosis not present

## 2018-12-25 DIAGNOSIS — Z8249 Family history of ischemic heart disease and other diseases of the circulatory system: Secondary | ICD-10-CM | POA: Insufficient documentation

## 2018-12-25 DIAGNOSIS — F32A Depression, unspecified: Secondary | ICD-10-CM | POA: Diagnosis present

## 2018-12-25 DIAGNOSIS — R5383 Other fatigue: Secondary | ICD-10-CM | POA: Insufficient documentation

## 2018-12-25 DIAGNOSIS — Z791 Long term (current) use of non-steroidal anti-inflammatories (NSAID): Secondary | ICD-10-CM

## 2018-12-25 DIAGNOSIS — D500 Iron deficiency anemia secondary to blood loss (chronic): Secondary | ICD-10-CM

## 2018-12-25 DIAGNOSIS — D508 Other iron deficiency anemias: Secondary | ICD-10-CM

## 2018-12-25 DIAGNOSIS — F1010 Alcohol abuse, uncomplicated: Secondary | ICD-10-CM | POA: Diagnosis not present

## 2018-12-25 DIAGNOSIS — F988 Other specified behavioral and emotional disorders with onset usually occurring in childhood and adolescence: Secondary | ICD-10-CM | POA: Diagnosis not present

## 2018-12-25 DIAGNOSIS — F419 Anxiety disorder, unspecified: Secondary | ICD-10-CM | POA: Insufficient documentation

## 2018-12-25 DIAGNOSIS — F418 Other specified anxiety disorders: Secondary | ICD-10-CM | POA: Insufficient documentation

## 2018-12-25 LAB — CBC WITH DIFFERENTIAL/PLATELET
Abs Immature Granulocytes: 0.01 10*3/uL (ref 0.00–0.07)
Basophils Absolute: 0 10*3/uL (ref 0.0–0.1)
Basophils Relative: 1 %
Eosinophils Absolute: 0.1 10*3/uL (ref 0.0–0.5)
Eosinophils Relative: 2 %
HCT: 24.1 % — ABNORMAL LOW (ref 36.0–46.0)
Hemoglobin: 6.5 g/dL — CL (ref 12.0–15.0)
Immature Granulocytes: 0 %
LYMPHS ABS: 1 10*3/uL (ref 0.7–4.0)
Lymphocytes Relative: 26 %
MCH: 20.9 pg — ABNORMAL LOW (ref 26.0–34.0)
MCHC: 27 g/dL — ABNORMAL LOW (ref 30.0–36.0)
MCV: 77.5 fL — ABNORMAL LOW (ref 80.0–100.0)
Monocytes Absolute: 0.4 10*3/uL (ref 0.1–1.0)
Monocytes Relative: 10 %
Neutro Abs: 2.3 10*3/uL (ref 1.7–7.7)
Neutrophils Relative %: 61 %
Platelets: 519 10*3/uL — ABNORMAL HIGH (ref 150–400)
RBC: 3.11 MIL/uL — ABNORMAL LOW (ref 3.87–5.11)
RDW: 15.3 % (ref 11.5–15.5)
WBC: 3.8 10*3/uL — ABNORMAL LOW (ref 4.0–10.5)
nRBC: 0 % (ref 0.0–0.2)

## 2018-12-25 LAB — IRON AND TIBC
Iron: 6 ug/dL — ABNORMAL LOW (ref 41–142)
Saturation Ratios: 1 % — ABNORMAL LOW (ref 21–57)
TIBC: 505 ug/dL — ABNORMAL HIGH (ref 236–444)
UIBC: 499 ug/dL — ABNORMAL HIGH (ref 120–384)

## 2018-12-25 LAB — CMP (CANCER CENTER ONLY)
ALK PHOS: 50 U/L (ref 38–126)
ALT: 9 U/L (ref 0–44)
AST: 13 U/L — ABNORMAL LOW (ref 15–41)
Albumin: 3.7 g/dL (ref 3.5–5.0)
Anion gap: 8 (ref 5–15)
BUN: 14 mg/dL (ref 8–23)
CALCIUM: 8.9 mg/dL (ref 8.9–10.3)
CO2: 24 mmol/L (ref 22–32)
Chloride: 110 mmol/L (ref 98–111)
Creatinine: 0.82 mg/dL (ref 0.44–1.00)
GFR, Est AFR Am: >60 mL/min (ref >60)
GFR, Estimated: >60 mL/min (ref >60)
Glucose, Bld: 101 mg/dL — ABNORMAL HIGH (ref 70–99)
Potassium: 4.2 mmol/L (ref 3.5–5.1)
Sodium: 142 mmol/L (ref 135–145)
Total Bilirubin: 0.3 mg/dL (ref 0.3–1.2)
Total Protein: 6.7 g/dL (ref 6.5–8.1)

## 2018-12-25 LAB — FERRITIN: Ferritin: <4 ng/mL — ABNORMAL LOW (ref 11–307)

## 2018-12-25 LAB — TYPE AND SCREEN
ABO/RH(D): A POS
Antibody Screen: NEGATIVE

## 2018-12-25 LAB — PREPARE RBC (CROSSMATCH)

## 2018-12-25 LAB — VITAMIN B12: Vitamin B-12: 396 pg/mL (ref 180–914)

## 2018-12-25 MED ORDER — POLYETHYLENE GLYCOL 3350 17 G PO PACK: 17.0000 g | PACK | Freq: Every day | ORAL | Status: DC | PRN

## 2018-12-25 MED ORDER — FLUOXETINE HCL 20 MG PO CAPS
40.0000 mg | ORAL_CAPSULE | Freq: Every day | ORAL | Status: DC
  Administered 2018-12-25 – 2018-12-26 (×2): 40 mg via ORAL
  Filled 2018-12-25 (×2): qty 2

## 2018-12-25 MED ORDER — LORAZEPAM 1 MG PO TABS: 1.0000 mg | ORAL_TABLET | Freq: Every day | ORAL | Status: DC | PRN

## 2018-12-25 MED ORDER — ONDANSETRON HCL 4 MG/2ML IJ SOLN: 4.0000 mg | Freq: Four times a day (QID) | INTRAMUSCULAR | Status: DC | PRN

## 2018-12-25 MED ORDER — ACETAMINOPHEN 325 MG PO TABS
650.0000 mg | ORAL_TABLET | Freq: Four times a day (QID) | ORAL | Status: DC | PRN
  Administered 2018-12-27: 650 mg via ORAL
  Filled 2018-12-25: qty 2

## 2018-12-25 MED ORDER — ACETAMINOPHEN 650 MG RE SUPP: 650.0000 mg | Freq: Four times a day (QID) | RECTAL | Status: DC | PRN

## 2018-12-25 MED ORDER — OXYCODONE HCL 5 MG PO TABS
5.0000 mg | ORAL_TABLET | ORAL | Status: DC | PRN
  Administered 2018-12-26 (×2): 5 mg via ORAL
  Filled 2018-12-25 (×2): qty 1

## 2018-12-25 MED ORDER — SODIUM CHLORIDE 0.9 % IV SOLN: 10.0000 mL/h | Freq: Once | INTRAVENOUS | Status: DC

## 2018-12-25 MED ORDER — SENNA 8.6 MG PO TABS
1.0000 | ORAL_TABLET | Freq: Two times a day (BID) | ORAL | Status: DC
  Administered 2018-12-25 – 2018-12-27 (×4): 8.6 mg via ORAL
  Filled 2018-12-25 (×4): qty 1

## 2018-12-25 MED ORDER — ONDANSETRON HCL 4 MG PO TABS: 4.0000 mg | ORAL_TABLET | Freq: Four times a day (QID) | ORAL | Status: DC | PRN

## 2018-12-25 MED ORDER — TRAZODONE HCL 50 MG PO TABS
25.0000 mg | ORAL_TABLET | Freq: Every evening | ORAL | Status: DC | PRN
  Administered 2018-12-25 – 2018-12-26 (×2): 25 mg via ORAL
  Filled 2018-12-25 (×2): qty 1

## 2018-12-25 NOTE — ED Notes (Signed)
ED Provider at bedside. 

## 2018-12-25 NOTE — ED Notes (Signed)
Admitting provider at the bedside, will transport after finishing his assessment.

## 2018-12-25 NOTE — ED Notes (Signed)
ED TO INPATIENT HANDOFF REPORT  Name/Age/Gender Laura Chang 63 y.o. female  Code Status Code Status History    Date Active Date Inactive Code Status Order ID Comments User Context   07/19/2015 2351 07/20/2015 1719 Full Code [id number]  Benjiman CorePickering, Nathan, MD ED   10/24/2012 0043 10/26/2012 1550 Full Code [id number]  Orvan SeenJohnson, Sierra D, RN Inpatient      Home/SNF/Other Home  Chief Complaint Low Hemoglobin  Level of Care/Admitting Diagnosis ED Disposition    ED Disposition Condition Comment   Admit  [facility] [[id number]]  Level of Care: Med-Surg [16]  I expect the patient will be discharged within 24 hours: Yes  LOW acuity---Tx typically complete <24 hrs---ACUTE conditions typically can be evaluated <24 hours---LABS likely to return to acceptable levels <24 hours---IS near functional baseline---EXPECTED to return to current living arrangement---NOT newly hypoxic: Does not meet criteria for 5C-Observation unit  Diagnosis: Iron deficiency anemia [147829][213315]  Admitting Physician: Almon HerculesGONFA, TAYE T [[id number]]  Attending Physician: Almon HerculesGONFA, TAYE T [[id number]]  PT Class (Do Not Modify): Observation [104]  PT Acc Code (Do Not Modify): Observation [10022]       Medical History Past Medical History:  Diagnosis Date  . ADHD   . Anemia   . Anxiety   . Arthritis   . Depression   . Heart murmur   . PONV (postoperative nausea and vomiting)     Allergies Allergies  Allergen Reactions  . Metronidazole Shortness Of Breath and Other (See Comments)    Heart pounding  . Meloxicam Other (See Comments)    Ankle swell    IV Location/Drains/Wounds Patient Lines/Drains/Airways Status   Active Line/Drains/Airways    Name:   Placement date:   Placement time:   Site:   Days:   Peripheral IV 12/25/18 Left Antecubital   12/25/18    1757    Antecubital   less than 1          Labs/Imaging Results for orders placed or performed during the hospital encounter of 12/25/18  (from the past 48 hour(s))  Type and screen [facility]     Status: None (Preliminary result)   Collection Time: 12/25/18  2:00 PM  Result Value Ref Range   ABO/RH(D) A POS    Antibody Screen NEG    Sample Expiration      12/28/2018 Performed at [facility], [address] [place], [place] [address]    Unit Number [id number]    Blood Component Type RED CELLS,LR    Unit division 00    Status of Unit ALLOCATED    Transfusion Status OK TO TRANSFUSE    Crossmatch Result Compatible   Prepare RBC     Status: None   Collection Time: 12/25/18  5:47 PM  Result Value Ref Range   Order Confirmation      ORDER PROCESSED BY BLOOD BANK Performed at [facility], [address] [place], [place] [address]    No results found. None  Pending Labs Unresulted Labs (From admission, onward)    Start     Ordered   12/25/18 2002  Occult blood card to lab, stool  ONCE - STAT,   R     12/25/18 2001   Signed and Held  HIV antibody (Routine Testing)  Once,   R     Signed and Held   Signed and Held  CBC  Tomorrow morning,   R     Signed and  Held   Signed and Held  Basic metabolic panel  Tomorrow morning,   R     Signed and Held          Vitals/Pain Today's Vitals   12/25/18 1341 12/25/18 1751 12/25/18 1915 12/25/18 1945  BP: (!) 148/94 136/80 133/81 (!) 134/96  Pulse: 86 78 76 76  Resp: 16 18 14  (!) 22  Temp: 98.4 F (36.9 C)     TempSrc: Oral     SpO2: 99% 98% 99% 100%    Isolation Precautions No active isolations  Medications Medications  0.9 %  sodium chloride infusion (has no administration in time range)    Mobility walks

## 2018-12-25 NOTE — ED Notes (Signed)
Paged [facility] 

## 2018-12-25 NOTE — Telephone Encounter (Signed)
Notified by [facility] lab @12 :45 hgb 6.5. Informed Dr. Candise Che, who asked that patient be advised patient to go to ED for blood transfusion. Attempted to contact patient's cell, lvm to call Dr. Clyda Greener office asap. Attempted to call home, left message with family for patient to call office asap. Patient called office back 12:58 - was given hgb result and Dr. Clyda Greener directions to go to ED. Patient verbalized understanding.

## 2018-12-25 NOTE — ED Provider Notes (Signed)
[facility] Provider Note   CSN: [id number] Arrival date & time: 12/25/18  1334     History   Chief Complaint Chief Complaint  Patient presents with  . Anemia  . Abnormal Lab    HPI Laura Chang is a 63 y.o. female.   Anemia  This is a recurrent problem. The problem occurs constantly. The problem has been gradually worsening. Pertinent negatives include no chest pain, no abdominal pain, no headaches and no shortness of breath. Nothing aggravates the symptoms. Nothing relieves the symptoms. She has tried nothing for the symptoms. The treatment provided no relief.  Abnormal Lab  Patient referred by:  PCP Result type: hematology     Past Medical History:  Diagnosis Date  . ADHD   . Anemia   . Anxiety   . Arthritis   . Depression   . Heart murmur   . PONV (postoperative nausea and vomiting)     Patient Active Problem List   Diagnosis Date Noted  . Alcohol use disorder, moderate, dependence (HCC) 07/20/2015  . Alcohol intoxication (HCC)   . Iron deficiency anemia 10/23/2012  . Alcoholism (HCC) 10/23/2012  . Lower extremity edema 10/23/2012  . Tobacco abuse 10/23/2012  . ADD (attention deficit disorder) 10/23/2012  . Depression     Past Surgical History:  Procedure Laterality Date  . CHOLECYSTECTOMY    . COLONOSCOPY  10/25/2012   Procedure: COLONOSCOPY;  Surgeon: Barrie FolkJohn C Hayes, MD;  [facility];  Service: Endoscopy;  Laterality: N/A;  . ESOPHAGOGASTRODUODENOSCOPY  10/24/2012   Procedure: ESOPHAGOGASTRODUODENOSCOPY (EGD);  Surgeon: Vertell NovakJames L Edwards Jr., MD;  [facility];  Service: Endoscopy;  Laterality: N/A;     OB History   No obstetric history on file.      Home Medications    Prior to Admission medications   Medication Sig Start Date End Date Taking? Authorizing Provider  acetaminophen (TYLENOL) 325 MG tablet Take 650 mg by mouth every 6 (six) hours as needed for moderate pain.   Yes  [provider]  amphetamine-dextroamphetamine (ADDERALL XR) 20 MG 24 hr capsule Take 1 capsule (20 mg total) by mouth 2 (two) times daily. Patient taking differently: Take 20 mg by mouth 2 (two) times daily as needed (ADHD).  11/21/18  Yes Cottle, Steva Readyarey G Jr., MD  FLUoxetine (PROZAC) 20 MG capsule Take 40 mg by mouth at bedtime.    Yes [provider]  LORazepam (ATIVAN) 1 MG tablet Take 1 tablet (1 mg total) by mouth every 8 (eight) hours. Patient taking differently: Take 1 mg by mouth daily as needed for anxiety.  11/21/18  Yes Cottle, Steva Readyarey G Jr., MD  amphetamine-dextroamphetamine (ADDERALL XR) 20 MG 24 hr capsule Take 1 capsule (20 mg total) by mouth 2 (two) times daily. Patient not taking: Reported on 12/25/2018 12/19/18   Cottle, Steva Readyarey G Jr., MD  amphetamine-dextroamphetamine (ADDERALL XR) 20 MG 24 hr capsule Take 1 capsule (20 mg total) by mouth 2 (two) times daily. Patient not taking: Reported on 12/25/2018 01/16/19   Lauraine Rinneottle, Carey G Jr., MD  metoprolol tartrate (LOPRESSOR) 100 MG tablet Take 1 tablet (100mg ) 2 hours prior to your Coronary CTA Patient not taking: Reported on 11/21/2018 11/18/18   Parke PoissonAcharya, Gayatri A, MD    Family History Family History  Problem Relation Age of Onset  . Atrial fibrillation Mother   . CAD Father     Social History Social History   Tobacco Use  . Smoking status:  Former Smoker    Last attempt to quit: 08/16/2003    Years since quitting: 15.3  . Smokeless tobacco: Never Used  Substance Use Topics  . Alcohol use: Yes    Alcohol/week: 10.0 standard drinks    Types: 10 Glasses of wine per week  . Drug use: No     Allergies   Metronidazole and Meloxicam   Review of Systems Review of Systems  Constitutional: Negative for chills and fever.  HENT: Negative for ear pain and sore throat.   Eyes: Negative for pain and visual disturbance.  Respiratory: Negative for cough and shortness of breath.   Cardiovascular: Negative for chest pain  and palpitations.  Gastrointestinal: Negative for abdominal pain and vomiting.  Genitourinary: Negative for dysuria and hematuria.  Musculoskeletal: Negative for arthralgias and back pain.  Skin: Negative for color change and rash.  Neurological: Negative for seizures, syncope and headaches.  All other systems reviewed and are negative.    Physical Exam Updated Vital Signs BP (!) 134/96   Pulse 76   Temp 98.4 F (36.9 C) (Oral)   Resp (!) 22   SpO2 100%   Physical Exam Vitals signs and nursing note reviewed.  Constitutional:      General: She is not in acute distress.    Appearance: She is well-developed.     Comments: Patient resting comfortably, no acute distress.  Patient walking without difficulty in the emergency department with walker, has hip arthritis.  HENT:     Head: Normocephalic and atraumatic.  Eyes:     Conjunctiva/sclera: Conjunctivae normal.  Neck:     Musculoskeletal: Neck supple.  Cardiovascular:     Rate and Rhythm: Normal rate and regular rhythm.     Heart sounds: No murmur.  Pulmonary:     Effort: Pulmonary effort is normal. No respiratory distress.     Breath sounds: Normal breath sounds.  Abdominal:     Palpations: Abdomen is soft.     Tenderness: There is no abdominal tenderness.  Skin:    General: Skin is warm and dry.     Capillary Refill: Capillary refill takes 2 to 3 seconds.  Neurological:     Mental Status: She is alert.      ED Treatments / Results  Labs (all labs ordered are listed, but only abnormal results are displayed) Labs Reviewed  OCCULT BLOOD X 1 CARD TO LAB, STOOL  TYPE AND SCREEN  PREPARE RBC (CROSSMATCH)    EKG None  Radiology No results found.  Procedures Procedures (including critical care time)  Medications Ordered in ED Medications  0.9 %  sodium chloride infusion (has no administration in time range)     Initial Impression / Assessment and Plan / ED Course  I have reviewed the triage vital signs  and the nursing notes.  Pertinent labs & imaging results that were available during my care of the patient were reviewed by me and considered in my medical decision making (see chart for details).     MDM  63 year old female significant past medical history of anemia, iron deficiency who presents from PCP office after blood work showed hemoglobin 6.5.  Patient denies any illness at this time, denies shortness of breath, chest pain, fever, infectious-like symptoms.  Patient denies any trauma or recent falls, bleeding.  Likely secondary due to her iron deficiency anemia, patient was in to see hematology today for likely IV iron therapy.  Physical exam negative for any acute findings, at baseline.  Will transfuse here  in the emergency department; patient in agreement with this plan, consent obtained.  Patient will be admitted for continued H&H checks as well as therapy.  Patient in agreement with this plan.  The above care was discussed and agreed upon by my attending physician.  Final Clinical Impressions(s) / ED Diagnoses   Final diagnoses:  None    ED Discharge Orders    None       Dahlia Client, MD 12/25/18 2023    Clarene Duke Ambrose Finland, MD 12/28/18 1044

## 2018-12-25 NOTE — Telephone Encounter (Signed)
Per 2/13 los  Labs today Will decide on IV Iron accordingly.  Printed avs.

## 2018-12-25 NOTE — Progress Notes (Signed)
HEMATOLOGY/ONCOLOGY CONSULTATION NOTE  Date of Service: 12/25/2018  Patient Care Team: Deatra JamesSun, Vyvyan, MD as PCP - General (Family Medicine) Parke PoissonAcharya, Gayatri A, MD as PCP - Cardiology (Cardiology)  CHIEF COMPLAINTS/PURPOSE OF CONSULTATION:  Iron deficiency anemia  HISTORY OF PRESENTING ILLNESS:   Laura Chang is a  63 y.o. female who has been referred to us by Dr. Deatra JamesVyvyan Sun for evaluation and management of Iron deficiency anemia. The pt reports that she is doing well overall.  The pt reports that she has had anemia for more than 5 years. She has recently seen Dr. Jerl Santosalldorf for considerations of a right hip replacement.  The pt notes that she began Meloxicam and 600mg  Ibuprofen in August 2019 after a peridontal procedure. She then developed "out of the blue" extreme fatigue, which she notes was similar to a previous time when her HGB was in the 4s, a few years ago. She then stopped taking NSAIDs, established care with PCP Dr. Wynelle LinkSun, and was referred to GI Dr. Randa EvensEdwards.   She notes that she then had an endoscopy and colonoscopy which was significant for a large hiatal hernia and ulcerations at the gastroesophageal junction. She then began 325mg  Ferrous Sulfate in November 2019.   The pt also notes that she contracted a GI virus and developed significant diarrhea within the last month.   The pt notes that she has had blood transfusions and received an IV Iron transfusion once in the past without complication. The pt notes that she has felt recently tired. She denies abdominal pains or leg swelling.  The pt notes that she was consuming "lots of alcohol 3-4 years ago." She notes that she has decreased her consumption to about 8-14 drinks of alcohol per week currently.   Most recent lab results (12/12/18) of CBC w/diff and BMP is as follows: all values are WNL except for RBC at 3.39, HGB at 7.5, HCT at 27.4, MCH at 22.1, MCHC at 27.4, PLT at 419k, Glucose at 102.  On review of systems, pt  reports recent concern for infection, lower energy levels, and denies pain along the spine, abdominal pains, leg swelling, and any other symptoms.  On PMHx the pt reports Iron deficiency anemia, ADD. On Social Hx the pt reports quit smoking cigarettes in 1980s, Consumes 8-14 drinks of alcohol per week. On Family Hx the pt reports father with heart attack at 62 years old.  MEDICAL HISTORY:  Past Medical History:  Diagnosis Date  . ADHD   . Anemia   . Anxiety   . Arthritis   . Depression   . Heart murmur   . PONV (postoperative nausea and vomiting)     SURGICAL HISTORY: Past Surgical History:  Procedure Laterality Date  . CHOLECYSTECTOMY    . COLONOSCOPY  10/25/2012   Procedure: COLONOSCOPY;  Surgeon: Barrie FolkJohn C Hayes, MD;  [facility];  Service: Endoscopy;  Laterality: N/A;  . ESOPHAGOGASTRODUODENOSCOPY  10/24/2012   Procedure: ESOPHAGOGASTRODUODENOSCOPY (EGD);  Surgeon: Vertell NovakJames L Edwards Jr., MD;  [facility];  Service: Endoscopy;  Laterality: N/A;    SOCIAL HISTORY: Social History   Socioeconomic History  . Marital status: Married    Spouse name: Not on file  . Number of children: Not on file  . Years of education: Not on file  . Highest education level: Not on file  Occupational History  . Not on file  Social Needs  . Financial resource strain: Not on file  . Food insecurity:  Worry: Not on file    Inability: Not on file  . Transportation needs:    Medical: Not on file    Non-medical: Not on file  Tobacco Use  . Smoking status: Former Smoker    Last attempt to quit: 08/16/2003    Years since quitting: 15.3  . Smokeless tobacco: Never Used  Substance and Sexual Activity  . Alcohol use: Yes    Alcohol/week: 10.0 standard drinks    Types: 10 Glasses of wine per week  . Drug use: No  . Sexual activity: Not on file  Lifestyle  . Physical activity:    Days per week: Not on file    Minutes per session: Not on file  . Stress: Not on file    Relationships  . Social connections:    Talks on phone: Not on file    Gets together: Not on file    Attends religious service: Not on file    Active member of club or organization: Not on file    Attends meetings of clubs or organizations: Not on file    Relationship status: Not on file  . Intimate partner violence:    Fear of current or ex partner: Not on file    Emotionally abused: Not on file    Physically abused: Not on file    Forced sexual activity: Not on file  Other Topics Concern  . Not on file  Social History Narrative  . Not on file    FAMILY HISTORY: Family History  Problem Relation Age of Onset  . Atrial fibrillation Mother   . CAD Father     ALLERGIES:  is allergic to metronidazole and meloxicam.  MEDICATIONS:  Current Outpatient Medications  Medication Sig Dispense Refill  . acetaminophen (TYLENOL) 325 MG tablet Take 650 mg by mouth every 6 (six) hours as needed for moderate pain.    Marland Kitchen. amphetamine-dextroamphetamine (ADDERALL XR) 20 MG 24 hr capsule Take 1 capsule (20 mg total) by mouth 2 (two) times daily. (Patient taking differently: Take 20 mg by mouth 2 (two) times daily as needed (ADHD). ) 60 capsule 0  . amphetamine-dextroamphetamine (ADDERALL XR) 20 MG 24 hr capsule Take 1 capsule (20 mg total) by mouth 2 (two) times daily. 60 capsule 0  . [START ON 01/16/2019] amphetamine-dextroamphetamine (ADDERALL XR) 20 MG 24 hr capsule Take 1 capsule (20 mg total) by mouth 2 (two) times daily. 60 capsule 0  . ferrous sulfate 325 (65 FE) MG tablet Take 325 mg by mouth daily with breakfast.    . FLUoxetine (PROZAC) 20 MG capsule Take 40 mg by mouth at bedtime.     . iron polysaccharides (NIFEREX) 150 MG capsule Take 150 mg by mouth at bedtime.    Marland Kitchen. LORazepam (ATIVAN) 1 MG tablet Take 1 tablet (1 mg total) by mouth every 8 (eight) hours. (Patient taking differently: Take 1 mg by mouth daily as needed for anxiety. ) 20 tablet 0  . metoprolol tartrate (LOPRESSOR) 100 MG  tablet Take 1 tablet (100mg ) 2 hours prior to your Coronary CTA (Patient not taking: Reported on 11/21/2018) 1 tablet 0   No current facility-administered medications for this visit.     REVIEW OF SYSTEMS:    10 Point review of Systems was done is negative except as noted above.  PHYSICAL EXAMINATION:  . Vitals:   12/25/18 1120  BP: (!) 141/71  Pulse: 89  Resp: 18  Temp: 98.4 F (36.9 C)  SpO2: 99%   Filed Weights  12/25/18 1120  Weight: 174 lb 4.8 oz (79.1 kg)   .Body mass index is 29.01 kg/m.  GENERAL:alert, in no acute distress and comfortable SKIN: no acute rashes, no significant lesions EYES: conjunctiva are pink and non-injected, sclera anicteric OROPHARYNX: MMM, no exudates, no oropharyngeal erythema or ulceration NECK: supple, no JVD LYMPH:  no palpable lymphadenopathy in the cervical, axillary or inguinal regions LUNGS: clear to auscultation b/l with normal respiratory effort HEART: regular rate & rhythm ABDOMEN:  normoactive bowel sounds , non tender, not distended. Extremity: no pedal edema PSYCH: alert & oriented x 3 with fluent speech NEURO: no focal motor/sensory deficits  LABORATORY DATA:  I have reviewed the data as listed  . CBC Latest Ref Rng & Units 12/12/2018 08/15/2018 07/19/2015  WBC 4.0 - 10.5 K/uL 5.8 2.8(L) 10.6(H)  Hemoglobin 12.0 - 15.0 g/dL 7.5(L) 8.0(L) 13.8  Hematocrit 36.0 - 46.0 % 27.4(L) 28.8(L) 42.0  Platelets 150 - 400 K/uL 419(H) 452(H) 298    . CMP Latest Ref Rng & Units 12/12/2018 11/18/2018 08/15/2018  Glucose 70 - 99 mg/dL 453(M) 468(E) 321(Y)  BUN 8 - 23 mg/dL 14 20 7   Creatinine 0.44 - 1.00 mg/dL 2.48 2.50 0.37  Sodium 135 - 145 mmol/L 143 140 140  Potassium 3.5 - 5.1 mmol/L 3.9 4.3 4.1  Chloride 98 - 111 mmol/L 110 104 111  CO2 22 - 32 mmol/L 22 18(L) 20(L)  Calcium 8.9 - 10.3 mg/dL 9.2 9.0 9.2  Total Protein 6.5 - 8.1 g/dL - - -  Total Bilirubin 0.3 - 1.2 mg/dL - - -  Alkaline Phos 38 - 126 U/L - - -  AST 15 - 41 U/L  - - -  ALT 14 - 54 U/L - - -   Component     Latest Ref Rng & Units 12/25/2018        12:22 PM  WBC     4.0 - 10.5 K/uL 3.8 (L)  RBC     3.87 - 5.11 MIL/uL 3.11 (L)  Hemoglobin     12.0 - 15.0 g/dL 6.5 (LL)  HCT     04.8 - 46.0 % 24.1 (L)  MCV     80.0 - 100.0 fL 77.5 (L)  MCH     26.0 - 34.0 pg 20.9 (L)  MCHC     30.0 - 36.0 g/dL 88.9 (L)  RDW     16.9 - 15.5 % 15.3  Platelets     150 - 400 K/uL 519 (H)  nRBC     0.0 - 0.2 % 0.0  Neutrophils     % 61  NEUT#     1.7 - 7.7 K/uL 2.3  Lymphocytes     % 26  Lymphocyte #     0.7 - 4.0 K/uL 1.0  Monocytes Relative     % 10  Monocyte #     0.1 - 1.0 K/uL 0.4  Eosinophil     % 2  Eosinophils Absolute     0.0 - 0.5 K/uL 0.1  Basophil     % 1  Basophils Absolute     0.0 - 0.1 K/uL 0.0  Immature Granulocytes     % 0  Abs Immature Granulocytes     0.00 - 0.07 K/uL 0.01  Sodium     135 - 145 mmol/L 142  Potassium     3.5 - 5.1 mmol/L 4.2  Chloride     98 - 111 mmol/L 110  CO2  22 - 32 mmol/L 24  Glucose     70 - 99 mg/dL 161 (H)  BUN     8 - 23 mg/dL 14  Creatinine     0.96 - 1.00 mg/dL 0.45  Calcium     8.9 - 10.3 mg/dL 8.9  Total Protein     6.5 - 8.1 g/dL 6.7  Albumin     3.5 - 5.0 g/dL 3.7  AST     15 - 41 U/L 13 (L)  ALT     0 - 44 U/L 9  Alkaline Phosphatase     38 - 126 U/L 50  Total Bilirubin     0.3 - 1.2 mg/dL 0.3  GFR, Est Non African American     >60 mL/min >60  GFR, Est African American     >60 mL/min >60  Anion gap     5 - 15 8  Iron     41 - 142 ug/dL 6 (L)  TIBC     409 - 811 ug/dL 914 (H)  Saturation Ratios     21 - 57 % 1 (L)  UIBC     120 - 384 ug/dL 782 (H)  Folate, Hemolysate     Not Estab. ng/mL   Folate, RBC     >498 ng/mL   Vitamin B12     180 - 914 pg/mL 396  Ferritin     11 - 307 ng/mL <4 (L)    RADIOGRAPHIC STUDIES: I have personally reviewed the radiological images as listed and agreed with the findings in the report. No results  found.  ASSESSMENT & PLAN:   63 y.o. female with  1. Iron deficiency anemia Recurrent and today very severe. Likely from ongoing GI bleeding probably from her significant hiatal hernia and cameron ulcerations + NSAID use. PLAN -Discussed patient's most recent labs from 12/12/18, HGB at 7.5 with MCV at 80.8. Normal creatinine -labs today showed hgb down to 6.5 with near undetectable ferritin levels confirmation of chronic GI bleeding. -Frequent NSAID use +Prozac an additional risk factors in addition to [facility] + Cameron ulcers -recommended going to ED for urgent transfusion and GIB workup today. -Will likely set pt up for IV Iron replacement based on labs. -we discussed that she will need close f/u with PCP and GI to address GI bleeding issues and that she does not appear to have a primary hematological issue causing her anemia. -patient cannot be cleared for hip surgery at this time given her anemia and GI bleeding and the need for anticoag prophylaxis after hip surgery.  Labs today Will decide on IV Iron accordingly   All of the patients questions were answered with apparent satisfaction. The patient knows to call the clinic with any problems, questions or concerns.  The total time spent in the appt was 45 minutes and more than 50% was on counseling and direct patient cares.    Wyvonnia Lora MD MS AAHIVMS [facility] Hematology/Oncology Physician [facility]  (Office):       [phone] (Work cell):  [phone] (Fax):           [phone]  12/25/2018 11:54 AM  I, Marcelline Mates, am acting as a scribe for Dr. Wyvonnia Lora.   .I have reviewed the above documentation for accuracy and completeness, and I agree with the above. Johney Maine MD

## 2018-12-25 NOTE — H&P (Signed)
History and Physical    Laura Chang [id number] DOB: [birth year] DOA: 12/25/2018  PCP: Deatra James, MD Patient coming from: Home  Chief Complaint: Low hemoglobin  HPI: Laura Chang is a 63 y.o. female with medical history significant for iron deficiency anemia, anxiety, ADHD, alcohol use disorder and osteoarthritis presenting from hematologist office for low hemoglobin.  Patient with known history of iron deficiency anemia.  Reports recent endoscopy and colonoscopy at [facility], Dr. Randa Evens in 10/2018.  Colonoscopy reportedly normal.  EGD showed large hiatal hernia and ulceration at the gastroesophageal junction.  Started on iron tablets and referred to hematology for further evaluation.  Patient reports low energy level but denies chest pain, dyspnea, palpitation or lightheadedness.  She denies melena or hematochezia.  No dietary restriction.  Denies history of liver disease.  History of alcohol abuse in the past but reports cutting down on her alcohol to about 1 glass of red wine daily on average.  Reports anemia in her 2 daughters and maternal grandparents.   She presented to hematology today.  Hemoglobin was down to 7.5.  Sent here for blood and iron transfusion.  Denies smoking cigarettes or recreational drug use.  In ED, vital signs not impressive.  CMP within normal range.  WBC 3.8.  RBC 6.5.  MCV 77.5.  Anemia profile significant for severe iron deficiency anemia.  Typed and crossed.  1 unit of blood ordered.  Hospitalist service was called for admission.  Review of Systems  Constitutional: Positive for malaise/fatigue. Negative for chills, fever and weight loss.  HENT: Negative for congestion and sore throat.   Eyes: Negative for blurred vision and photophobia.  Respiratory: Negative for cough and shortness of breath.   Cardiovascular: Negative for chest pain, palpitations and leg swelling.  Gastrointestinal: Negative for abdominal pain, blood in stool, diarrhea, melena,  nausea and vomiting.  Genitourinary: Negative for dysuria and hematuria.  Musculoskeletal: Positive for joint pain. Negative for myalgias.  Skin: Negative for rash.  Neurological: Negative for sensory change, speech change, focal weakness and headaches.  Endo/Heme/Allergies: Does not bruise/bleed easily.  Psychiatric/Behavioral: Negative for substance abuse. The patient is not nervous/anxious.    PMH Past Medical History:  Diagnosis Date  . ADHD   . Anemia   . Anxiety   . Arthritis   . Depression   . Heart murmur   . PONV (postoperative nausea and vomiting)    PSH Past Surgical History:  Procedure Laterality Date  . CHOLECYSTECTOMY    . COLONOSCOPY  10/25/2012   Procedure: COLONOSCOPY;  Surgeon: Barrie Folk, MD;  Location: [facility] ENDOSCOPY;  Service: Endoscopy;  Laterality: N/A;  . ESOPHAGOGASTRODUODENOSCOPY  10/24/2012   Procedure: ESOPHAGOGASTRODUODENOSCOPY (EGD);  Surgeon: Vertell Novak., MD;  Location: [facility] ENDOSCOPY;  Service: Endoscopy;  Laterality: N/A;   Fam HX Family History  Problem Relation Age of Onset  . Atrial fibrillation Mother   . CAD Father    Social Hx  reports that she quit smoking about 15 years ago. She has never used smokeless tobacco. She reports current alcohol use of about 10.0 standard drinks of alcohol per week. She reports that she does not use drugs.  Allergy Allergies  Allergen Reactions  . Metronidazole Shortness Of Breath and Other (See Comments)    Heart pounding  . Meloxicam Other (See Comments)    Ankle swell   Home Meds Prior to Admission medications   Medication Sig Start Date End Date Taking? Authorizing Provider  acetaminophen (TYLENOL) 325 MG  tablet Take 650 mg by mouth every 6 (six) hours as needed for moderate pain.   Yes [provider]  amphetamine-dextroamphetamine (ADDERALL XR) 20 MG 24 hr capsule Take 1 capsule (20 mg total) by mouth 2 (two) times daily. Patient taking differently: Take 20 mg by mouth 2 (two)  times daily as needed (ADHD).  11/21/18  Yes Cottle, Steva Ready., MD  FLUoxetine (PROZAC) 20 MG capsule Take 40 mg by mouth at bedtime.    Yes [provider]  LORazepam (ATIVAN) 1 MG tablet Take 1 tablet (1 mg total) by mouth every 8 (eight) hours. Patient taking differently: Take 1 mg by mouth daily as needed for anxiety.  11/21/18  Yes Cottle, Steva Ready., MD  amphetamine-dextroamphetamine (ADDERALL XR) 20 MG 24 hr capsule Take 1 capsule (20 mg total) by mouth 2 (two) times daily. Patient not taking: Reported on January 19, 2019 12/19/18   Cottle, Steva Ready., MD  amphetamine-dextroamphetamine (ADDERALL XR) 20 MG 24 hr capsule Take 1 capsule (20 mg total) by mouth 2 (two) times daily. Patient not taking: Reported on January 19, 2019 01/16/19   Lauraine Rinne., MD  metoprolol tartrate (LOPRESSOR) 100 MG tablet Take 1 tablet (100mg ) 2 hours prior to your Coronary CTA Patient not taking: Reported on 11/21/2018 11/18/18   Parke Poisson, MD    Physical Exam: Vitals:   01-19-19 1341 01-19-2019 1751 2019-01-19 1915 2019-01-19 1945  BP: (!) 148/94 136/80 133/81 (!) 134/96  Pulse: 86 78 76 76  Resp: 16 18 14  (!) 22  Temp: 98.4 F (36.9 C)     TempSrc: Oral     SpO2: 99% 98% 99% 100%    Constitutional - resting comfortably, no acute distress Eyes -sclera anicteric.  Conjunctival pallor Nose - no gross deformity or drainage Mouth - no oral lesions noted Throat - no swelling or erythema Endo - no obvious thyromegaly CV -RRR.  (+)S1S2, no murmurs; no JVD or peripheral edema Resp - No increased work of breathing, good aeration bilaterally, no wheeze or crackles GI - (+)BS, soft, non-tender, non-distended MSK - normal range of motion, no obvious deformity Skin - no obvious rashes or lesions Neuro - alert, aware, oriented to person/place/time. No gross deficit Psych - calm, normal mood and affect  Labs on Admission: I have personally reviewed following labs and imaging studies  CBC: Recent Labs    Lab 01/19/2019 1222  WBC 3.8*  NEUTROABS 2.3  HGB 6.5*  HCT 24.1*  MCV 77.5*  PLT 519*   Basic Metabolic Panel: Recent Labs  Lab 2019-01-19 1222  NA 142  K 4.2  CL 110  CO2 24  GLUCOSE 101*  BUN 14  CREATININE 0.82  CALCIUM 8.9   GFR: Estimated Creatinine Clearance: 74.8 mL/min (by C-G formula based on SCr of 0.82 mg/dL). Liver Function Tests: Recent Labs  Lab 19-Jan-2019 1222  AST 13*  ALT 9  ALKPHOS 50  BILITOT 0.3  PROT 6.7  ALBUMIN 3.7   No results for input(s): LIPASE, AMYLASE in the last 168 hours. No results for input(s): AMMONIA in the last 168 hours. Coagulation Profile: No results for input(s): INR, PROTIME in the last 168 hours. Cardiac Enzymes: No results for input(s): CKTOTAL, CKMB, CKMBINDEX, TROPONINI in the last 168 hours. BNP (last 3 results) No results for input(s): PROBNP in the last 8760 hours. HbA1C: No results for input(s): HGBA1C in the last 72 hours. CBG: No results for input(s): GLUCAP in the last 168 hours. Lipid Profile: No  results for input(s): CHOL, HDL, LDLCALC, TRIG, CHOLHDL, LDLDIRECT in the last 72 hours. Thyroid Function Tests: No results for input(s): TSH, T4TOTAL, FREET4, T3FREE, THYROIDAB in the last 72 hours. Anemia Panel: Recent Labs    12/25/18 1222  VITAMINB12 396  FERRITIN <4*  TIBC 505*  IRON 6*   Urine analysis:    Component Value Date/Time   COLORURINE YELLOW 12/12/2018 0822   APPEARANCEUR CLEAR 12/12/2018 0822   LABSPEC 1.025 12/12/2018 0822   PHURINE 5.5 12/12/2018 0822   GLUCOSEU NEGATIVE 12/12/2018 0822   HGBUR SMALL (A) 12/12/2018 0822   BILIRUBINUR NEGATIVE 12/12/2018 0822   KETONESUR NEGATIVE 12/12/2018 0822   PROTEINUR NEGATIVE 12/12/2018 0822   NITRITE NEGATIVE 12/12/2018 0822   LEUKOCYTESUR SMALL (A) 12/12/2018 0822    Sepsis Labs:  No leukocytosis  Radiological Exams on Admission: No results found.  All images have been reviewed by me personally.   EKG: Not obtained this  admission.  Assessment/Plan Principal Problem:   Iron deficiency anemia Active Problems:   Depression   ADD (attention deficit disorder)  Iron deficiency anemia: Chronic issue for this patient.  No obvious explanation at this point.?EtOH. Reports normal colonoscopy in 10/2018 with [facility].  EGD at the same time showed large hiatal hernia and some ulceration at the gastro esophageal junction.  No official read in epic.  Anemia panel with severe iron deficiency. -Admit for observation. -Transfuse 1 unit.  Posttransfusion H&H -Transfuse Feraheme after blood transfusion -Patient already connected with hematologist. -FOBT  Depression/ADHD: stable -Continue home meds  History of EtOH abuse: Reports cutting down on this recently.  Osteoarthritis: -PRN pain meds  DVT prophylaxis: SCD Code Status: Full code Family Communication: None at bedside Disposition Plan: Admit to MedSurg.  Consults called: None Admission status: Observation   Almon Herculesaye T Gonfa MD [facility]  If 7PM-7AM, please contact night-coverage [email or web address] Password [email or web address]  12/25/2018, 8:46 PM

## 2018-12-25 NOTE — ED Notes (Signed)
Consent at bedside.  

## 2018-12-25 NOTE — ED Triage Notes (Signed)
Pt sent here by hematologist for hgb 6.5. Unknown cause for anemia, has hx of same. VSS

## 2018-12-26 ENCOUNTER — Encounter: Payer: 59 | Admitting: Adult Health

## 2018-12-26 DIAGNOSIS — D508 Other iron deficiency anemias: Secondary | ICD-10-CM | POA: Diagnosis not present

## 2018-12-26 LAB — PREPARE RBC (CROSSMATCH)

## 2018-12-26 LAB — BASIC METABOLIC PANEL
Anion gap: 9 (ref 5–15)
BUN: 14 mg/dL (ref 8–23)
CO2: 22 mmol/L (ref 22–32)
Calcium: 8.8 mg/dL — ABNORMAL LOW (ref 8.9–10.3)
Chloride: 110 mmol/L (ref 98–111)
Creatinine, Ser: 0.63 mg/dL (ref 0.44–1.00)
GFR calc Af Amer: >60 mL/min (ref >60)
GFR calc non Af Amer: >60 mL/min (ref >60)
Glucose, Bld: 102 mg/dL — ABNORMAL HIGH (ref 70–99)
POTASSIUM: 3.9 mmol/L (ref 3.5–5.1)
Sodium: 141 mmol/L (ref 135–145)

## 2018-12-26 LAB — CBC
HCT: 24 % — ABNORMAL LOW (ref 36.0–46.0)
Hemoglobin: 6.7 g/dL — CL (ref 12.0–15.0)
MCH: 22 pg — AB (ref 26.0–34.0)
MCHC: 27.9 g/dL — ABNORMAL LOW (ref 30.0–36.0)
MCV: 78.7 fL — ABNORMAL LOW (ref 80.0–100.0)
Platelets: 432 10*3/uL — ABNORMAL HIGH (ref 150–400)
RBC: 3.05 MIL/uL — ABNORMAL LOW (ref 3.87–5.11)
RDW: 15.5 % (ref 11.5–15.5)
WBC: 3.9 10*3/uL — ABNORMAL LOW (ref 4.0–10.5)
nRBC: 0 % (ref 0.0–0.2)

## 2018-12-26 LAB — FOLATE RBC
Folate, Hemolysate: 415 ng/mL
Folate, RBC: 1895 ng/mL (ref >498)
Hematocrit: 21.9 % — ABNORMAL LOW (ref 34.0–46.6)

## 2018-12-26 LAB — HIV ANTIBODY (ROUTINE TESTING W REFLEX): HIV Screen 4th Generation wRfx: NONREACTIVE

## 2018-12-26 LAB — HEMOGLOBIN AND HEMATOCRIT, BLOOD
HCT: 28.5 % — ABNORMAL LOW (ref 36.0–46.0)
Hemoglobin: 8 g/dL — ABNORMAL LOW (ref 12.0–15.0)

## 2018-12-26 MED ORDER — SODIUM CHLORIDE 0.9% IV SOLUTION: Freq: Once | INTRAVENOUS | Status: DC

## 2018-12-26 MED ORDER — PANTOPRAZOLE SODIUM 40 MG PO TBEC
40.0000 mg | DELAYED_RELEASE_TABLET | Freq: Every day | ORAL | Status: DC
  Administered 2018-12-26 – 2018-12-27 (×2): 40 mg via ORAL
  Filled 2018-12-26: qty 1

## 2018-12-26 MED ORDER — SODIUM CHLORIDE 0.9 % IV SOLN
510.0000 mg | Freq: Once | INTRAVENOUS | Status: AC
  Administered 2018-12-26: 510 mg via INTRAVENOUS
  Filled 2018-12-26: qty 17

## 2018-12-26 NOTE — Progress Notes (Signed)
Progress Note    Laura Chang  [id number] DOB: [birth year]  DOA: 12/25/2018 PCP: Deatra James, MD    Brief Narrative:     Medical records reviewed and are as summarized below:  Laura Chang is an 63 y.o. female with medical history significant for iron deficiency anemia, anxiety, ADHD, alcohol use disorder and osteoarthritis presenting from hematologist office for low hemoglobin.  Assessment/Plan:   Principal Problem:   Iron deficiency anemia Active Problems:   Depression   ADD (attention deficit disorder)  Severe Iron deficiency anemia:  Reports normal colonoscopy in 10/2018 with [facility].  EGD at the same time showed large hiatal hernia and some ulceration at the gastro esophageal junction.   -  Anemia panel with severe iron deficiency. -s/p 2 units PRBC -IV Fe -repeat Hgb 8-- despite this, patient is still symptomatic-- will transfuse 1 unit and recheck in AM -heme stool still pending -in 2016 had normal MCV and Hgb but otherwise always anemic  Depression/ADHD: stable -Continue home meds  History of EtOH abuse:  -Reports cutting down on this recently -monitor for withdrawals  Osteoarthritis: -PRN pain meds -Hgb needs to be 10 for surgery   Family Communication/Anticipated D/C date and plan/Code Status   DVT prophylaxis: scd Code Status: Full Code.  Family Communication: husband Disposition Plan:    Medical Consultants:    None.   Subjective:   Even after 2 units PRBC she is still very symptomatic when moving around room  Objective:    Vitals:   12/26/18 1123 12/26/18 1154 12/26/18 1215 12/26/18 1419  BP: 129/83 (!) 141/91 135/81 127/76  Pulse: 71 71 72 71  Resp: 18 18 18 18   Temp: 97.8 F (36.6 C) 98.5 F (36.9 C) 98.7 F (37.1 C) 98.3 F (36.8 C)  TempSrc: Oral Oral Oral Oral  SpO2: 96% 96% 95% 97%  Weight:      Height:        Intake/Output Summary (Last 24 hours) at 12/26/2018 1729 Last data filed at 12/26/2018  1419 Gross per 24 hour  Intake 880 ml  Output -  Net 880 ml   Filed Weights   12/25/18 2048  Weight: 78.2 kg    Exam: In bed, NAD rrr No wheezing, no increase work of breathing No rashes or lesions +BS, spleen not enlarged  Data Reviewed:   I have personally reviewed following labs and imaging studies:  Labs: Labs show the following:   Basic Metabolic Panel: Recent Labs  Lab 12/25/18 1222 12/26/18 0448  NA 142 141  K 4.2 3.9  CL 110 110  CO2 24 22  GLUCOSE 101* 102*  BUN 14 14  CREATININE 0.82 0.63  CALCIUM 8.9 8.8*   GFR Estimated Creatinine Clearance: 76.4 mL/min (by C-G formula based on SCr of 0.63 mg/dL). Liver Function Tests: Recent Labs  Lab 12/25/18 1222  AST 13*  ALT 9  ALKPHOS 50  BILITOT 0.3  PROT 6.7  ALBUMIN 3.7   No results for input(s): LIPASE, AMYLASE in the last 168 hours. No results for input(s): AMMONIA in the last 168 hours. Coagulation profile No results for input(s): INR, PROTIME in the last 168 hours.  CBC: Recent Labs  Lab 12/25/18 1221 12/25/18 1222 12/26/18 0448 12/26/18 1617  WBC  --  3.8* 3.9*  --   NEUTROABS  --  2.3  --   --   HGB  --  6.5* 6.7* 8.0*  HCT 21.9* 24.1* 24.0* 28.5*  MCV  --  77.5* 78.7*  --   PLT  --  519* 432*  --    Cardiac Enzymes: No results for input(s): CKTOTAL, CKMB, CKMBINDEX, TROPONINI in the last 168 hours. BNP (last 3 results) No results for input(s): PROBNP in the last 8760 hours. CBG: No results for input(s): GLUCAP in the last 168 hours. D-Dimer: No results for input(s): DDIMER in the last 72 hours. Hgb A1c: No results for input(s): HGBA1C in the last 72 hours. Lipid Profile: No results for input(s): CHOL, HDL, LDLCALC, TRIG, CHOLHDL, LDLDIRECT in the last 72 hours. Thyroid function studies: No results for input(s): TSH, T4TOTAL, T3FREE, THYROIDAB in the last 72 hours.  Invalid input(s): FREET3 Anemia work up: Recent Labs    12/25/18 1222  VITAMINB12 396  FERRITIN <4*   TIBC 505*  IRON 6*   Sepsis Labs: Recent Labs  Lab 12/25/18 1222 12/26/18 0448  WBC 3.8* 3.9*    Microbiology No results found for this or any previous visit (from the past 240 hour(s)).  Procedures and diagnostic studies:  No results found.  Medications:   . sodium chloride   Intravenous Once  . sodium chloride   Intravenous Once  . sodium chloride   Intravenous Once  . FLUoxetine  40 mg Oral QHS  . senna  1 tablet Oral BID   Continuous Infusions: . sodium chloride       LOS: 0 days   Joseph Art  [facility]   How to contact the [facility] Attending or Consulting provider 7A - 7P or covering provider during after hours 7P -7A, for this patient?  1. Check the care team in [facility] and look for a) attending/consulting [facility] provider listed and b) the [facility] team listed 2. Log into [email or web address] and use [place]'s universal password to access. If you do not have the password, please contact the hospital operator. 3. Locate the [facility] provider you are looking for under [facility] and page to a number that you can be directly reached. 4. If you still have difficulty reaching the provider, please page the [facility] (Director on Call) for the Hospitalists listed on amion for assistance.  12/26/2018, 5:29 PM

## 2018-12-27 DIAGNOSIS — D508 Other iron deficiency anemias: Secondary | ICD-10-CM | POA: Diagnosis not present

## 2018-12-27 LAB — TYPE AND SCREEN
ABO/RH(D): A POS
Antibody Screen: NEGATIVE
UNIT DIVISION: 0
Unit division: 0
Unit division: 0

## 2018-12-27 LAB — BASIC METABOLIC PANEL
Anion gap: 9 (ref 5–15)
BUN: 12 mg/dL (ref 8–23)
CO2: 21 mmol/L — ABNORMAL LOW (ref 22–32)
Calcium: 8.9 mg/dL (ref 8.9–10.3)
Chloride: 110 mmol/L (ref 98–111)
Creatinine, Ser: 0.73 mg/dL (ref 0.44–1.00)
GFR calc Af Amer: >60 mL/min (ref >60)
GFR calc non Af Amer: >60 mL/min (ref >60)
Glucose, Bld: 114 mg/dL — ABNORMAL HIGH (ref 70–99)
Potassium: 3.7 mmol/L (ref 3.5–5.1)
Sodium: 140 mmol/L (ref 135–145)

## 2018-12-27 LAB — BPAM RBC
Blood Product Expiration Date: 202003092359
Blood Product Expiration Date: 202003092359
Blood Product Expiration Date: 202003092359
ISSUE DATE / TIME: 202002132139
ISSUE DATE / TIME: 202002141130
ISSUE DATE / TIME: 202002141800
Unit Type and Rh: 6200
Unit Type and Rh: 6200
Unit Type and Rh: 6200

## 2018-12-27 LAB — CBC
HCT: 32.3 % — ABNORMAL LOW (ref 36.0–46.0)
Hemoglobin: 9.9 g/dL — ABNORMAL LOW (ref 12.0–15.0)
MCH: 25.3 pg — ABNORMAL LOW (ref 26.0–34.0)
MCHC: 30.7 g/dL (ref 30.0–36.0)
MCV: 82.4 fL (ref 80.0–100.0)
NRBC: 0 % (ref 0.0–0.2)
Platelets: 409 10*3/uL — ABNORMAL HIGH (ref 150–400)
RBC: 3.92 MIL/uL (ref 3.87–5.11)
RDW: 16.9 % — ABNORMAL HIGH (ref 11.5–15.5)
WBC: 4.5 10*3/uL (ref 4.0–10.5)

## 2018-12-27 MED ORDER — PANTOPRAZOLE SODIUM 40 MG PO TBEC: 40.0000 mg | DELAYED_RELEASE_TABLET | Freq: Every day | ORAL | 0 refills | Status: DC

## 2018-12-27 MED ORDER — LORAZEPAM 1 MG PO TABS: 1.0000 mg | ORAL_TABLET | Freq: Every day | ORAL | Status: DC | PRN

## 2018-12-27 NOTE — Progress Notes (Signed)
Blood transfusion completed at 2106. Patient tolerated it well. No rxn was observed.

## 2018-12-27 NOTE — Discharge Summary (Signed)
Physician Discharge Summary  Laura Chang [id number] DOB: Jul 29, 1956 DOA: 12/25/2018  PCP: Deatra James, MD  Admit date: 12/25/2018 Discharge date: 12/27/2018  Admitted From: home Discharge disposition: home   Recommendations for Outpatient Follow-Up:   Cbc 1 week IV iron 1 week Follow up with GI And hematology  Discharge Diagnosis:   Principal Problem:   Iron deficiency anemia Active Problems:   Depression   ADD (attention deficit disorder)    Discharge Condition: Improved.  Diet recommendation:  Regular.  Wound care: None.  Code status: Full.   History of Present Illness:   Laura Chang is a 63 y.o. female with medical history significant for iron deficiency anemia, anxiety, ADHD, alcohol use disorder and osteoarthritis presenting from hematologist office for low hemoglobin.  Patient with known history of iron deficiency anemia.  Reports recent endoscopy and colonoscopy at [facility], Dr. Randa Evens in 10/2018.  Colonoscopy reportedly normal.  EGD showed large hiatal hernia and ulceration at the gastroesophageal junction.  Started on iron tablets and referred to hematology for further evaluation.  Patient reports low energy level but denies chest pain, dyspnea, palpitation or lightheadedness.  She denies melena or hematochezia.  No dietary restriction.  Denies history of liver disease.  History of alcohol abuse in the past but reports cutting down on her alcohol to about 1 glass of red wine daily on average.  Reports anemia in her 2 daughters and maternal grandparents.   She presented to hematology today.  Hemoglobin was down to 7.5.  Sent here for blood and iron transfusion.   Hospital Course by Problem:   Severe Iron deficiency anemia:  Reports normal colonoscopy in 10/2018 with [facility].EGD at the same time showed large hiatal hernia and some ulceration at the gastro esophageal junction. - Anemia panel with severe iron deficiency. -s/p 3 units  PRBC -IV Fe -could never collect heme stool but suspect this is source -added PPI for now -in 2016 had normal MCV and Hgb but otherwise always anemic  Depression/ADHD: stable -Continue home meds  History of EtOH abuse: -Reports cutting down on this recently -no sign of withdrawal  Osteoarthritis: -PRN pain meds -Hgb needs to be 10 for surgery     Medical Consultants:      Discharge Exam:   Vitals:   12/26/18 2328 12/27/18 0610  BP: (!) 143/89 (!) 158/90  Pulse: 74 63  Resp: 18 18  Temp: 99 F (37.2 C) 98 F (36.7 C)  SpO2: 96% 96%   Vitals:   12/26/18 1820 12/26/18 2110 12/26/18 2328 12/27/18 0610  BP: (!) 148/90 (!) 142/66 (!) 143/89 (!) 158/90  Pulse: 78 76 74 63  Resp: 18 16 18 18   Temp: 98.6 F (37 C) 99.3 F (37.4 C) 99 F (37.2 C) 98 F (36.7 C)  TempSrc: Oral Oral Oral Oral  SpO2: 98% 96% 96% 96%  Weight:      Height:        General exam: Appears calm and comfortable.    The results of significant diagnostics from this hospitalization (including imaging, microbiology, ancillary and laboratory) are listed below for reference.     Procedures and Diagnostic Studies:   No results found.   Labs:   Basic Metabolic Panel: Recent Labs  Lab 12/25/18 1222 12/26/18 0448 12/27/18 0547  NA 142 141 140  K 4.2 3.9 3.7  CL 110 110 110  CO2 24 22 21*  GLUCOSE 101* 102* 114*  BUN 14 14 12  CREATININE 0.82 0.63 0.73  CALCIUM 8.9 8.8* 8.9   GFR Estimated Creatinine Clearance: 76.4 mL/min (by C-G formula based on SCr of 0.73 mg/dL). Liver Function Tests: Recent Labs  Lab 12/25/18 1222  AST 13*  ALT 9  ALKPHOS 50  BILITOT 0.3  PROT 6.7  ALBUMIN 3.7   No results for input(s): LIPASE, AMYLASE in the last 168 hours. No results for input(s): AMMONIA in the last 168 hours. Coagulation profile No results for input(s): INR, PROTIME in the last 168 hours.  CBC: Recent Labs  Lab 12/25/18 1221 12/25/18 1222 12/26/18 0448  12/26/18 1617 12/27/18 0547  WBC  --  3.8* 3.9*  --  4.5  NEUTROABS  --  2.3  --   --   --   HGB  --  6.5* 6.7* 8.0* 9.9*  HCT 21.9* 24.1* 24.0* 28.5* 32.3*  MCV  --  77.5* 78.7*  --  82.4  PLT  --  519* 432*  --  409*   Cardiac Enzymes: No results for input(s): CKTOTAL, CKMB, CKMBINDEX, TROPONINI in the last 168 hours. BNP: Invalid input(s): POCBNP CBG: No results for input(s): GLUCAP in the last 168 hours. D-Dimer No results for input(s): DDIMER in the last 72 hours. Hgb A1c No results for input(s): HGBA1C in the last 72 hours. Lipid Profile No results for input(s): CHOL, HDL, LDLCALC, TRIG, CHOLHDL, LDLDIRECT in the last 72 hours. Thyroid function studies No results for input(s): TSH, T4TOTAL, T3FREE, THYROIDAB in the last 72 hours.  Invalid input(s): FREET3 Anemia work up Recent Labs    12/25/18 1222  VITAMINB12 396  FERRITIN <4*  TIBC 505*  IRON 6*   Microbiology No results found for this or any previous visit (from the past 240 hour(s)).   Discharge Instructions:   Discharge Instructions    Diet general   Complete by:  As directed    Increase activity slowly   Complete by:  As directed      Allergies as of 12/27/2018      Reactions   Metronidazole Shortness Of Breath, Other (See Comments)   Heart pounding   Meloxicam Other (See Comments)   Ankle swell      Medication List    STOP taking these medications   metoprolol tartrate 100 MG tablet Commonly known as:  LOPRESSOR     TAKE these medications   acetaminophen 325 MG tablet Commonly known as:  TYLENOL Take 650 mg by mouth every 6 (six) hours as needed for moderate pain.   amphetamine-dextroamphetamine 20 MG 24 hr capsule Commonly known as:  ADDERALL XR Take 1 capsule (20 mg total) by mouth 2 (two) times daily. What changed:  Another medication with the same name was removed. Continue taking this medication, and follow the directions you see here.   FLUoxetine 20 MG capsule Commonly known  as:  PROZAC Take 40 mg by mouth at bedtime.   LORazepam 1 MG tablet Commonly known as:  ATIVAN Take 1 tablet (1 mg total) by mouth daily as needed for anxiety.   pantoprazole 40 MG tablet Commonly known as:  PROTONIX Take 1 tablet (40 mg total) by mouth daily.         Time coordinating discharge: 25 min  Signed:  Joseph Art DO  [facility] 12/27/2018, 8:45 AM

## 2018-12-29 ENCOUNTER — Encounter: Payer: Self-pay | Admitting: Hematology

## 2018-12-30 ENCOUNTER — Telehealth: Payer: Self-pay | Admitting: *Deleted

## 2018-12-30 ENCOUNTER — Other Ambulatory Visit: Payer: Self-pay | Admitting: *Deleted

## 2018-12-30 ENCOUNTER — Telehealth: Payer: Self-pay | Admitting: Hematology

## 2018-12-30 ENCOUNTER — Encounter: Payer: Self-pay | Admitting: Hematology

## 2018-12-30 DIAGNOSIS — D500 Iron deficiency anemia secondary to blood loss (chronic): Secondary | ICD-10-CM

## 2018-12-30 NOTE — Telephone Encounter (Signed)
Scheduled appt per 2/18 sch message - pt is aware of appt date and time   

## 2018-12-30 NOTE — Telephone Encounter (Signed)
Contacted patient per Dr. Clyda Greener request to schedule patient for f/u with him in clinic in 6 weeks post hospitalization with labs cbc/diff, ferritin, iron profile.  Patient was crying during call. Stated her hip really hurts and she needs to have surgery. Stated she waited 6 hours in ED lobby before she was seen last Friday-she was advised to go there for hgb 6.5. Patient stated she did want to come back to see Dr. Candise Che. Advised that scheduler will contact her with 6 week hospital follow up appointment.

## 2018-12-31 ENCOUNTER — Ambulatory Visit: Payer: Self-pay | Admitting: Internal Medicine

## 2019-01-21 ENCOUNTER — Encounter: Payer: Self-pay | Admitting: Hematology

## 2019-01-22 ENCOUNTER — Other Ambulatory Visit: Payer: Self-pay | Admitting: Hematology

## 2019-01-23 ENCOUNTER — Telehealth: Payer: Self-pay | Admitting: *Deleted

## 2019-01-23 DIAGNOSIS — D500 Iron deficiency anemia secondary to blood loss (chronic): Secondary | ICD-10-CM

## 2019-01-23 NOTE — Telephone Encounter (Signed)
Contacted patient in response to MyChart message. Per Dr. Candise Che: Does not have to wait until appt 3/26 for lab tests. Can have labs (cbc w/diff, Ferritin, Iron) in the next few days at her convenience. Contacted patient with this information. Schedule message sent - patient will be contacted with date/time of lab appt. Patient verbalized understanding.

## 2019-01-26 ENCOUNTER — Encounter: Payer: Self-pay | Admitting: Hematology

## 2019-01-27 ENCOUNTER — Telehealth: Payer: Self-pay | Admitting: Psychiatry

## 2019-01-27 ENCOUNTER — Inpatient Hospital Stay: Payer: 59 | Attending: Hematology

## 2019-01-27 ENCOUNTER — Other Ambulatory Visit: Payer: Self-pay | Admitting: Psychiatry

## 2019-01-27 ENCOUNTER — Other Ambulatory Visit: Payer: Self-pay

## 2019-01-27 DIAGNOSIS — D500 Iron deficiency anemia secondary to blood loss (chronic): Secondary | ICD-10-CM

## 2019-01-27 DIAGNOSIS — F900 Attention-deficit hyperactivity disorder, predominantly inattentive type: Secondary | ICD-10-CM

## 2019-01-27 DIAGNOSIS — D509 Iron deficiency anemia, unspecified: Secondary | ICD-10-CM | POA: Insufficient documentation

## 2019-01-27 LAB — CBC WITH DIFFERENTIAL (CANCER CENTER ONLY)
Abs Immature Granulocytes: 0.01 10*3/uL (ref 0.00–0.07)
Basophils Absolute: 0 10*3/uL (ref 0.0–0.1)
Basophils Relative: 1 %
Eosinophils Absolute: 0.1 10*3/uL (ref 0.0–0.5)
Eosinophils Relative: 2 %
HCT: 43.1 % (ref 36.0–46.0)
Hemoglobin: 12.8 g/dL (ref 12.0–15.0)
Immature Granulocytes: 0 %
LYMPHS PCT: 30 %
Lymphs Abs: 1.3 10*3/uL (ref 0.7–4.0)
MCH: 25.6 pg — ABNORMAL LOW (ref 26.0–34.0)
MCHC: 29.7 g/dL — ABNORMAL LOW (ref 30.0–36.0)
MCV: 86.2 fL (ref 80.0–100.0)
Monocytes Absolute: 0.4 10*3/uL (ref 0.1–1.0)
Monocytes Relative: 8 %
Neutro Abs: 2.6 10*3/uL (ref 1.7–7.7)
Neutrophils Relative %: 59 %
Platelet Count: 307 10*3/uL (ref 150–400)
RBC: 5 MIL/uL (ref 3.87–5.11)
RDW: 20.9 % — ABNORMAL HIGH (ref 11.5–15.5)
WBC Count: 4.3 10*3/uL (ref 4.0–10.5)
nRBC: 0 % (ref 0.0–0.2)

## 2019-01-27 LAB — CMP (CANCER CENTER ONLY)
ALBUMIN: 3.7 g/dL (ref 3.5–5.0)
ALT: 14 U/L (ref 0–44)
AST: 14 U/L — ABNORMAL LOW (ref 15–41)
Alkaline Phosphatase: 54 U/L (ref 38–126)
Anion gap: 13 (ref 5–15)
BUN: 16 mg/dL (ref 8–23)
CO2: 20 mmol/L — ABNORMAL LOW (ref 22–32)
Calcium: 9.3 mg/dL (ref 8.9–10.3)
Chloride: 109 mmol/L (ref 98–111)
Creatinine: 0.78 mg/dL (ref 0.44–1.00)
GFR, Est AFR Am: >60 mL/min (ref >60)
GFR, Estimated: >60 mL/min (ref >60)
GLUCOSE: 104 mg/dL — AB (ref 70–99)
Potassium: 4.1 mmol/L (ref 3.5–5.1)
Sodium: 142 mmol/L (ref 135–145)
Total Bilirubin: 0.5 mg/dL (ref 0.3–1.2)
Total Protein: 7.1 g/dL (ref 6.5–8.1)

## 2019-01-27 LAB — FERRITIN: Ferritin: 20 ng/mL (ref 11–307)

## 2019-01-27 LAB — IRON AND TIBC
Iron: 74 ug/dL (ref 41–142)
Saturation Ratios: 18 % — ABNORMAL LOW (ref 21–57)
TIBC: 399 ug/dL (ref 236–444)
UIBC: 325 ug/dL (ref 120–384)

## 2019-01-27 MED ORDER — AMPHETAMINE-DEXTROAMPHET ER 20 MG PO CP24: 20.0000 mg | ORAL_CAPSULE | Freq: Two times a day (BID) | ORAL | 0 refills | Status: DC

## 2019-01-27 NOTE — Telephone Encounter (Signed)
Patient called and said that she needs a refill of adderrall xr 20 mg 2 x daily to [facility]rib[facility] [address]. Her next appt is may 20th

## 2019-01-28 ENCOUNTER — Encounter: Payer: Self-pay | Admitting: Hematology

## 2019-02-04 ENCOUNTER — Telehealth: Payer: Self-pay | Admitting: *Deleted

## 2019-02-04 ENCOUNTER — Other Ambulatory Visit: Payer: Self-pay | Admitting: Hematology

## 2019-02-04 ENCOUNTER — Telehealth: Payer: Self-pay | Admitting: Hematology

## 2019-02-04 ENCOUNTER — Other Ambulatory Visit: Payer: Self-pay | Admitting: *Deleted

## 2019-02-04 DIAGNOSIS — D500 Iron deficiency anemia secondary to blood loss (chronic): Secondary | ICD-10-CM

## 2019-02-04 NOTE — Telephone Encounter (Signed)
Patient called - asked if she could receive lab results and cancel appt for 3/26 and reschedule. Patient states she is taking Ferrex 150mg  once daily prescribed by Dr. Randa Evens.  Per Dr. Candise Che, inform patient that blood chemistry is ok, iron is better, but could receive dose of IV iron.  Attempted to contact patient, LVM to advise patient that IV iron was advised and that scheduling will contact regarding IV iron and f/u lab/MD appt.

## 2019-02-04 NOTE — Telephone Encounter (Signed)
Scheduled appt per 3/25 sch message - unable to reach patient - left message for patient with appt date and time

## 2019-02-05 ENCOUNTER — Inpatient Hospital Stay: Payer: 59 | Admitting: Hematology

## 2019-02-05 ENCOUNTER — Inpatient Hospital Stay: Payer: 59

## 2019-02-12 ENCOUNTER — Telehealth: Payer: Self-pay | Admitting: *Deleted

## 2019-02-12 NOTE — Telephone Encounter (Signed)
Patient contacted office, lvm: Nervous about coming out at this time. Can appt for Friday 4/3 be moved out one month - she has Ferrex to take for the month. Per Dr. Candise Che: Continue po iron and schedule iv iron in a month. Contacted patient with Dr. Clyda Greener directions. Patient verbalized understanding. A-pt 4/3 cancelled and sched msg sent

## 2019-02-13 ENCOUNTER — Inpatient Hospital Stay: Payer: 59

## 2019-02-17 ENCOUNTER — Telehealth: Payer: Self-pay | Admitting: Hematology

## 2019-02-17 NOTE — Telephone Encounter (Signed)
Rescheduled iv iron appt per sch msg. Called and spoke with patient. Confirmed date and time

## 2019-02-18 ENCOUNTER — Other Ambulatory Visit: Payer: Self-pay

## 2019-02-18 MED ORDER — FLUOXETINE HCL 20 MG PO CAPS: 60.0000 mg | ORAL_CAPSULE | Freq: Every day | ORAL | 0 refills | Status: DC

## 2019-03-14 ENCOUNTER — Other Ambulatory Visit: Payer: Self-pay | Admitting: Psychiatry

## 2019-03-15 ENCOUNTER — Encounter: Payer: Self-pay | Admitting: Hematology

## 2019-03-15 NOTE — Telephone Encounter (Signed)
Just refilled 04/08 for 90 day

## 2019-03-16 ENCOUNTER — Inpatient Hospital Stay: Payer: 59

## 2019-03-26 ENCOUNTER — Telehealth: Payer: Self-pay | Admitting: Psychiatry

## 2019-03-26 NOTE — Telephone Encounter (Signed)
Patient called and said that she needs a refill on her adderrall 20 mg xr 2 caps daily [facility] [address]. Next appt 5/20

## 2019-03-27 ENCOUNTER — Other Ambulatory Visit: Payer: Self-pay

## 2019-03-27 DIAGNOSIS — F900 Attention-deficit hyperactivity disorder, predominantly inattentive type: Secondary | ICD-10-CM

## 2019-03-27 MED ORDER — AMPHETAMINE-DEXTROAMPHET ER 20 MG PO CP24: 20.0000 mg | ORAL_CAPSULE | Freq: Two times a day (BID) | ORAL | 0 refills | Status: DC

## 2019-03-27 NOTE — Telephone Encounter (Signed)
Pended for approval.

## 2019-04-01 ENCOUNTER — Other Ambulatory Visit: Payer: Self-pay

## 2019-04-01 ENCOUNTER — Ambulatory Visit: Payer: 59 | Admitting: Psychiatry

## 2019-04-02 ENCOUNTER — Encounter: Payer: Self-pay | Admitting: Hematology

## 2019-04-10 ENCOUNTER — Encounter: Payer: Self-pay | Admitting: Hematology

## 2019-04-10 ENCOUNTER — Telehealth: Payer: Self-pay | Admitting: Hematology

## 2019-04-10 NOTE — Telephone Encounter (Signed)
Spoke with patient re 6/5 appointment.  °

## 2019-04-16 NOTE — Progress Notes (Signed)
HEMATOLOGY/ONCOLOGY CLINIC NOTE  Date of Service: 04/17/2019  Patient Care Team: Deatra JamesSun, Vyvyan, MD as PCP - General (Family Medicine) Parke PoissonAcharya, Gayatri A, MD as PCP - Cardiology (Cardiology)  CHIEF COMPLAINTS/PURPOSE OF CONSULTATION:  Iron deficiency anemia  HISTORY OF PRESENTING ILLNESS:   Laura Chang is a  63 y.o. female who has been referred to us by Dr. Deatra JamesVyvyan Sun for evaluation and management of Iron deficiency anemia. The pt reports that she is doing well overall.  The pt reports that she has had anemia for more than 5 years. She has recently seen Dr. Jerl Santosalldorf for considerations of a right hip replacement.  The pt notes that she began Meloxicam and 600mg  Ibuprofen in August 2019 after a peridontal procedure. She then developed "out of the blue" extreme fatigue, which she notes was similar to a previous time when her HGB was in the 4s, a few years ago. She then stopped taking NSAIDs, established care with PCP Dr. Wynelle LinkSun, and was referred to GI Dr. Randa EvensEdwards.   She notes that she then had an endoscopy and colonoscopy which was significant for a large hiatal hernia and ulcerations at the gastroesophageal junction. She then began 325mg  Ferrous Sulfate in November 2019.   The pt also notes that she contracted a GI virus and developed significant diarrhea within the last month.   The pt notes that she has had blood transfusions and received an IV Iron transfusion once in the past without complication. The pt notes that she has felt recently tired. She denies abdominal pains or leg swelling.  The pt notes that she was consuming "lots of alcohol 3-4 years ago." She notes that she has decreased her consumption to about 8-14 drinks of alcohol per week currently.   Most recent lab results (12/12/18) of CBC w/diff and BMP is as follows: all values are WNL except for RBC at 3.39, HGB at 7.5, HCT at 27.4, MCH at 22.1, MCHC at 27.4, PLT at 419k, Glucose at 102.  On review of systems, pt reports  recent concern for infection, lower energy levels, and denies pain along the spine, abdominal pains, leg swelling, and any other symptoms.  On PMHx the pt reports Iron deficiency anemia, ADD. On Social Hx the pt reports quit smoking cigarettes in 1980s, Consumes 8-14 drinks of alcohol per week. On Family Hx the pt reports father with heart attack at 62 years old.  Interval History:   Laura Chang returns today for management and evaluation of her Iron deficiency anemia. The patient's last visit with us was on 12/25/18. The pt reports that she is doing well overall.  The pt reports that she has felt a little tired recently, which she feels is related to being more active recently. The pt notes that she drank "3 glasses of wine or so, every day" a few weeks ago. She notes that her hip pain has continued to be very painful for her, and she has been using Ibuprofen every day for this pain.  Lab results today (04/17/19) of CBC w/diff is as follows: all values are WNL except for WBC at 3.7k, RBC at 3.77, HGB at 9.7, HCT at 33.0, MCH at 25.7, MCHC at 29.4. 04/17/19 Ferritin is <4   On review of systems, pt reports lower energy levels, recent increased activity, continued hip pain, and denies concerns for infections, and any other symptoms.    MEDICAL HISTORY:  Past Medical History:  Diagnosis Date  . ADHD   . Anemia   .  Anxiety   . Arthritis   . Depression   . Heart murmur   . PONV (postoperative nausea and vomiting)     SURGICAL HISTORY: Past Surgical History:  Procedure Laterality Date  . CHOLECYSTECTOMY    . COLONOSCOPY  10/25/2012   Procedure: COLONOSCOPY;  Surgeon: Barrie Folk, MD;  Location: [facility] ENDOSCOPY;  Service: Endoscopy;  Laterality: N/A;  . ESOPHAGOGASTRODUODENOSCOPY  10/24/2012   Procedure: ESOPHAGOGASTRODUODENOSCOPY (EGD);  Surgeon: Vertell Novak., MD;  Location[facility] ENDOSCOPY;  Service: Endoscopy;  Laterality: N/A;    SOCIAL HISTORY: Social History   Socioeconomic  History  . Marital status: Married    Spouse name: Not on file  . Number of children: Not on file  . Years of education: Not on file  . Highest education level: Not on file  Occupational History  . Not on file  Social Needs  . Financial resource strain: Not on file  . Food insecurity:    Worry: Not on file    Inability: Not on file  . Transportation needs:    Medical: Not on file    Non-medical: Not on file  Tobacco Use  . Smoking status: Former Smoker    Last attempt to quit: 08/16/2003    Years since quitting: 15.6  . Smokeless tobacco: Never Used  Substance and Sexual Activity  . Alcohol use: Yes    Alcohol/week: 10.0 standard drinks    Types: 10 Glasses of wine per week  . Drug use: No  . Sexual activity: Not on file  Lifestyle  . Physical activity:    Days per week: Not on file    Minutes per session: Not on file  . Stress: Not on file  Relationships  . Social connections:    Talks on phone: Not on file    Gets together: Not on file    Attends religious service: Not on file    Active member of club or organization: Not on file    Attends meetings of clubs or organizations: Not on file    Relationship status: Not on file  . Intimate partner violence:    Fear of current or ex partner: Not on file    Emotionally abused: Not on file    Physically abused: Not on file    Forced sexual activity: Not on file  Other Topics Concern  . Not on file  Social History Narrative  . Not on file    FAMILY HISTORY: Family History  Problem Relation Age of Onset  . Atrial fibrillation Mother   . CAD Father     ALLERGIES:  is allergic to metronidazole and meloxicam.  MEDICATIONS:  Current Outpatient Medications  Medication Sig Dispense Refill  . acetaminophen (TYLENOL) 325 MG tablet Take 650 mg by mouth every 6 (six) hours as needed for moderate pain.    Marland Kitchen amphetamine-dextroamphetamine (ADDERALL XR) 20 MG 24 hr capsule Take 1 capsule (20 mg total) by mouth 2 (two) times  daily. 60 capsule 0  . amphetamine-dextroamphetamine (ADDERALL XR) 20 MG 24 hr capsule Take 1 capsule (20 mg total) by mouth 2 (two) times daily. 60 capsule 0  . FLUoxetine (PROZAC) 20 MG capsule Take 3 capsules (60 mg total) by mouth daily. 270 capsule 0  . LORazepam (ATIVAN) 1 MG tablet Take 1 tablet (1 mg total) by mouth daily as needed for anxiety.    . pantoprazole (PROTONIX) 40 MG tablet Take 1 tablet (40 mg total) by mouth daily. 30 tablet 0  No current facility-administered medications for this visit.     REVIEW OF SYSTEMS:    A 10+ POINT REVIEW OF SYSTEMS WAS OBTAINED including neurology, dermatology, psychiatry, cardiac, respiratory, lymph, extremities, GI, GU, Musculoskeletal, constitutional, breasts, reproductive, HEENT.  All pertinent positives are noted in the HPI.  All others are negative.   PHYSICAL EXAMINATION:  . Vitals:   04/17/19 0857  BP: 140/68  Pulse: 77  Resp: 17  Temp: 98.5 F (36.9 C)  SpO2: 99%   Filed Weights   04/17/19 0857  Weight: 172 lb 11.2 oz (78.3 kg)   .Body mass index is 28.74 kg/m.  GENERAL:alert, in no acute distress and comfortable SKIN: no acute rashes, no significant lesions EYES: conjunctiva are pink and non-injected, sclera anicteric OROPHARYNX: MMM, no exudates, no oropharyngeal erythema or ulceration NECK: supple, no JVD LYMPH:  no palpable lymphadenopathy in the cervical, axillary or inguinal regions LUNGS: clear to auscultation b/l with normal respiratory effort HEART: regular rate & rhythm ABDOMEN:  normoactive bowel sounds , non tender, not distended. No palpable hepatosplenomegaly.  Extremity: no pedal edema PSYCH: alert & oriented x 3 with fluent speech NEURO: no focal motor/sensory deficits   LABORATORY DATA:  I have reviewed the data as listed  . CBC Latest Ref Rng & Units 04/17/2019 01/27/2019 12/27/2018  WBC 4.0 - 10.5 K/uL 3.7(L) 4.3 4.5  Hemoglobin 12.0 - 15.0 g/dL 1.6(X) 09.6 0.4(V)  Hematocrit 36.0 - 46.0 %  33.0(L) 43.1 32.3(L)  Platelets 150 - 400 K/uL 395 307 409(H)    . CMP Latest Ref Rng & Units 01/27/2019 12/27/2018 12/26/2018  Glucose 70 - 99 mg/dL 409(W) 119(J) 478(G)  BUN 8 - 23 mg/dL Creatinine 0.44 - 1.00 mg/dL 9.56 2.13 0.86  Sodium 135 - 145 mmol/L 142 140 141  Potassium 3.5 - 5.1 mmol/L 4.1 3.7 3.9  Chloride 98 - 111 mmol/L 109 110 110  CO2 22 - 32 mmol/L 20(L) 21(L) 22  Calcium 8.9 - 10.3 mg/dL 9.3 8.9 5.7(Q)  Total Protein 6.5 - 8.1 g/dL 7.1 - -  Total Bilirubin 0.3 - 1.2 mg/dL 0.5 - -  Alkaline Phos 38 - 126 U/L 54 - -  AST 15 - 41 U/L 14(L) - -  ALT 0 - 44 U/L 14 - -    . Lab Results  Component Value Date   IRON 15 (L) 04/17/2019   TIBC 445 (H) 04/17/2019   IRONPCTSAT 3 (L) 04/17/2019   (Iron and TIBC)  Lab Results  Component Value Date   FERRITIN <4 (L) 04/17/2019    RADIOGRAPHIC STUDIES: I have personally reviewed the radiological images as listed and agreed with the findings in the report. No results found.  ASSESSMENT & PLAN:   63 y.o. female with  1. Iron deficiency anemia Recurrent and today very severe. Likely from ongoing GI bleeding probably from her significant hiatal hernia and cameron ulcerations + NSAID use + ETOH abuse. Frequent NSAID use +Prozac an additional risk factors in addition to [facility] + Cameron ulcers  [place] upon initial presentation from 12/12/18, HGB at 7.5 with MCV at 80.8. Normal creatinine  PLAN -Discussed pt labwork today, 04/17/19; HGB at 9.7 -04/17/19 Ferritin is <4 -Discussed that the patient's HGB has continued to drop which does show evidence of continued GI bleeding. Her HGB was 12.8 two months ago, and is down to 9.7 today. -Discussed the significant risks of placing the pt on post-op blood thinners in the setting of ongoing GI bleeding. Discussed  that blood thinners are necessary for a hip surgery. -Recommend further coordination with Dr. Randa Evens in GI for management of GI bleeding -Discussed that the patient's  daily use of NSAIDs and alcohol is a further risk factor for increased GI bleeding and advised against these. Tylenol would be okay. Recommend discussing alternative pain management strategies with her PCP. -Recommend two weekly doses of IV Injectafer. Proceed with first weekly dose today. -Discussed that from anemia standpoint the pt will be cleared for surgery if her HGB >10, but recommend that clearance by GI and PCP will be needed to address her GI bleeding risk prior to beginning blood thinners and to evaluate risk of bleeding with beginning blood thinners post-operatively. -Will check labs again in 6 weeks -Will see the pt back in 3 months   IV Iron today Plz schedule 2nd dose of IV Injectafer in 1 week Labs in 6 weeks RTC with Dr Candise Che with labs in 9months   All of the patients questions were answered with apparent satisfaction. The patient knows to call the clinic with any problems, questions or concerns.  The total time spent in the appt was 25 minutes and more than 50% was on counseling and direct patient cares.    Wyvonnia Lora MD MS AAHIVMS [facility] Hematology/Oncology Physician [facility]  (Office):       [phone] (Work cell):  [phone] (Fax):           [phone]  04/17/2019 9:53 AM  I, Marcelline Mates, am acting as a scribe for Dr. Wyvonnia Lora.   .I have reviewed the above documentation for accuracy and completeness, and I agree with the above. Johney Maine MD

## 2019-04-17 ENCOUNTER — Other Ambulatory Visit: Payer: Self-pay

## 2019-04-17 ENCOUNTER — Telehealth: Payer: Self-pay | Admitting: Hematology

## 2019-04-17 ENCOUNTER — Inpatient Hospital Stay: Payer: 59

## 2019-04-17 ENCOUNTER — Encounter: Payer: Self-pay | Admitting: Hematology

## 2019-04-17 ENCOUNTER — Inpatient Hospital Stay: Payer: 59 | Attending: Hematology | Admitting: Hematology

## 2019-04-17 VITALS — BP 140/77 | HR 75 | Temp 98.9°F | Resp 16

## 2019-04-17 VITALS — BP 140/68 | HR 77 | Temp 98.5°F | Resp 17 | Ht 65.0 in | Wt 172.7 lb

## 2019-04-17 DIAGNOSIS — F418 Other specified anxiety disorders: Secondary | ICD-10-CM | POA: Insufficient documentation

## 2019-04-17 DIAGNOSIS — D509 Iron deficiency anemia, unspecified: Secondary | ICD-10-CM | POA: Diagnosis present

## 2019-04-17 DIAGNOSIS — D500 Iron deficiency anemia secondary to blood loss (chronic): Secondary | ICD-10-CM

## 2019-04-17 DIAGNOSIS — R5383 Other fatigue: Secondary | ICD-10-CM | POA: Diagnosis not present

## 2019-04-17 DIAGNOSIS — R197 Diarrhea, unspecified: Secondary | ICD-10-CM | POA: Diagnosis not present

## 2019-04-17 DIAGNOSIS — Z87891 Personal history of nicotine dependence: Secondary | ICD-10-CM | POA: Insufficient documentation

## 2019-04-17 DIAGNOSIS — Z8249 Family history of ischemic heart disease and other diseases of the circulatory system: Secondary | ICD-10-CM | POA: Insufficient documentation

## 2019-04-17 DIAGNOSIS — K449 Diaphragmatic hernia without obstruction or gangrene: Secondary | ICD-10-CM | POA: Diagnosis not present

## 2019-04-17 DIAGNOSIS — M25559 Pain in unspecified hip: Secondary | ICD-10-CM | POA: Diagnosis not present

## 2019-04-17 DIAGNOSIS — M1990 Unspecified osteoarthritis, unspecified site: Secondary | ICD-10-CM

## 2019-04-17 DIAGNOSIS — Z79899 Other long term (current) drug therapy: Secondary | ICD-10-CM | POA: Diagnosis not present

## 2019-04-17 DIAGNOSIS — D508 Other iron deficiency anemias: Secondary | ICD-10-CM

## 2019-04-17 LAB — CBC WITH DIFFERENTIAL (CANCER CENTER ONLY)
Abs Immature Granulocytes: 0 10*3/uL (ref 0.00–0.07)
Basophils Absolute: 0 10*3/uL (ref 0.0–0.1)
Basophils Relative: 1 %
Eosinophils Absolute: 0.1 10*3/uL (ref 0.0–0.5)
Eosinophils Relative: 2 %
HCT: 33 % — ABNORMAL LOW (ref 36.0–46.0)
Hemoglobin: 9.7 g/dL — ABNORMAL LOW (ref 12.0–15.0)
Immature Granulocytes: 0 %
Lymphocytes Relative: 29 %
Lymphs Abs: 1.1 10*3/uL (ref 0.7–4.0)
MCH: 25.7 pg — ABNORMAL LOW (ref 26.0–34.0)
MCHC: 29.4 g/dL — ABNORMAL LOW (ref 30.0–36.0)
MCV: 87.5 fL (ref 80.0–100.0)
Monocytes Absolute: 0.4 10*3/uL (ref 0.1–1.0)
Monocytes Relative: 12 %
Neutro Abs: 2.1 10*3/uL (ref 1.7–7.7)
Neutrophils Relative %: 56 %
Platelet Count: 395 10*3/uL (ref 150–400)
RBC: 3.77 MIL/uL — ABNORMAL LOW (ref 3.87–5.11)
RDW: 13.5 % (ref 11.5–15.5)
WBC Count: 3.7 10*3/uL — ABNORMAL LOW (ref 4.0–10.5)
nRBC: 0 % (ref 0.0–0.2)

## 2019-04-17 LAB — IRON AND TIBC
Iron: 15 ug/dL — ABNORMAL LOW (ref 41–142)
Saturation Ratios: 3 % — ABNORMAL LOW (ref 21–57)
TIBC: 445 ug/dL — ABNORMAL HIGH (ref 236–444)
UIBC: 430 ug/dL — ABNORMAL HIGH (ref 120–384)

## 2019-04-17 LAB — FERRITIN: Ferritin: <4 ng/mL — ABNORMAL LOW (ref 11–307)

## 2019-04-17 MED ORDER — SODIUM CHLORIDE 0.9 % IV SOLN
Freq: Once | INTRAVENOUS | Status: AC
  Administered 2019-04-17: 10:00:00 via INTRAVENOUS
  Filled 2019-04-17: qty 250

## 2019-04-17 MED ORDER — SODIUM CHLORIDE 0.9 % IV SOLN
750.0000 mg | Freq: Once | INTRAVENOUS | Status: AC
  Administered 2019-04-17: 750 mg via INTRAVENOUS
  Filled 2019-04-17: qty 15

## 2019-04-17 NOTE — Patient Instructions (Signed)

## 2019-04-17 NOTE — Telephone Encounter (Signed)
Scheduled appt per 6/5 los. Mail box was full and could not leave a voice message.

## 2019-04-23 ENCOUNTER — Other Ambulatory Visit: Payer: Self-pay | Admitting: Hematology

## 2019-04-23 ENCOUNTER — Telehealth: Payer: Self-pay | Admitting: *Deleted

## 2019-04-23 NOTE — Telephone Encounter (Signed)
Iron infusion not yet approved by insurance [facility]). Managed care will notify office once obtained. Dr. Irene Limbo states can wait until next week if patient is not symptomatic. Contacted patient to cancel appt for 6/11 and inform her that schedulers will contact her about a day next week. Patient verbalized understanding.

## 2019-04-24 ENCOUNTER — Telehealth: Payer: Self-pay | Admitting: Hematology

## 2019-04-24 ENCOUNTER — Inpatient Hospital Stay: Payer: 59

## 2019-04-24 NOTE — Telephone Encounter (Signed)
Confirmed 6/17 appointment with patient.

## 2019-04-27 ENCOUNTER — Telehealth: Payer: Self-pay | Admitting: *Deleted

## 2019-04-27 NOTE — Telephone Encounter (Signed)
Contacted patient to notify her that IV iron has been changed from [facility] to Ferrelicit as [place] did not approve Injectafer (without trial of Ferrelicit). Ferrelicit requires 4 doses to equal same amount of iron. MD has written order for Ferrelicit 4 doses/ 1 a week. Patient has first dose on 6/17. Patient verbalized understanding and agreement. Will send schedule message to schedule next 3 doses over next 3 weeks. Patient states she will expect a call from scheduling.

## 2019-04-28 ENCOUNTER — Encounter: Payer: Self-pay | Admitting: Hematology

## 2019-04-29 ENCOUNTER — Telehealth: Payer: Self-pay | Admitting: Hematology

## 2019-04-29 ENCOUNTER — Inpatient Hospital Stay: Payer: 59

## 2019-04-29 ENCOUNTER — Telehealth: Payer: Self-pay | Admitting: *Deleted

## 2019-04-29 ENCOUNTER — Encounter: Payer: Self-pay | Admitting: Hematology

## 2019-04-29 NOTE — Telephone Encounter (Signed)
Contacted patient in response to 2 MyChart messages stating she did not fell well and was scheduled for appointment this morning for first of 4 IV Iron infusions.  Advised patient that today's appointment for IV Iron will be cancelled. She is in agreement and states she is fatigues, so she will rest. She should monitor symptoms and contact PCP if temperature rises above 100.66F, shortness of breath/respiratory symptoms, chest pain or if body aches increase.  Advised patient that MD will be notified and that schedulers will contact her reschedule today's appointment and next 3 appts for IV Iron.

## 2019-04-29 NOTE — Telephone Encounter (Signed)
Per 6/17 schedule message schedule weekly infusion * 4 beginning 6/23. confirmed with patient.

## 2019-05-05 ENCOUNTER — Inpatient Hospital Stay: Payer: 59

## 2019-05-06 ENCOUNTER — Telehealth: Payer: Self-pay | Admitting: *Deleted

## 2019-05-06 ENCOUNTER — Encounter: Payer: Self-pay | Admitting: *Deleted

## 2019-05-06 NOTE — Telephone Encounter (Signed)
Attempted to contact patient regarding missed appointment on 6/23 at 8:30 for her iron infusion. She has appts on the next 3 Tuesdays at 8:30 for iron, and may need to add another appointment to make up for the missed one. Encouraged patient to contact  [facility] scheduling if she needs to change any appointments. Her mail box is full and was not able to leave a message. Sent patient a message via [place].

## 2019-05-12 ENCOUNTER — Other Ambulatory Visit: Payer: Self-pay

## 2019-05-12 ENCOUNTER — Inpatient Hospital Stay: Payer: 59

## 2019-05-12 ENCOUNTER — Inpatient Hospital Stay: Payer: 59 | Attending: Hematology | Admitting: Medical

## 2019-05-12 ENCOUNTER — Other Ambulatory Visit: Payer: Self-pay | Admitting: Medical

## 2019-05-12 VITALS — BP 109/80 | HR 85 | Temp 97.5°F | Resp 20

## 2019-05-12 DIAGNOSIS — D509 Iron deficiency anemia, unspecified: Secondary | ICD-10-CM

## 2019-05-12 DIAGNOSIS — R110 Nausea: Secondary | ICD-10-CM

## 2019-05-12 DIAGNOSIS — T8090XA Unspecified complication following infusion and therapeutic injection, initial encounter: Secondary | ICD-10-CM

## 2019-05-12 DIAGNOSIS — D508 Other iron deficiency anemias: Secondary | ICD-10-CM

## 2019-05-12 MED ORDER — MORPHINE SULFATE 4 MG/ML IJ SOLN: 2.0000 mg | Freq: Once | INTRAMUSCULAR | Status: DC

## 2019-05-12 MED ORDER — MORPHINE SULFATE (PF) 4 MG/ML IV SOLN: 2.0000 mg | Freq: Once | INTRAVENOUS | Status: DC

## 2019-05-12 MED ORDER — ACETAMINOPHEN 325 MG PO TABS
ORAL_TABLET | ORAL | Status: AC
  Filled 2019-05-12: qty 2

## 2019-05-12 MED ORDER — ONDANSETRON HCL 4 MG/2ML IJ SOLN
8.0000 mg | Freq: Once | INTRAMUSCULAR | Status: AC
  Administered 2019-05-12: 8 mg via INTRAVENOUS

## 2019-05-12 MED ORDER — DIPHENHYDRAMINE HCL 25 MG PO TABS
25.0000 mg | ORAL_TABLET | Freq: Once | ORAL | Status: AC
  Administered 2019-05-12: 25 mg via ORAL
  Filled 2019-05-12: qty 1

## 2019-05-12 MED ORDER — ONDANSETRON HCL 4 MG/2ML IJ SOLN
INTRAMUSCULAR | Status: AC
  Filled 2019-05-12: qty 4

## 2019-05-12 MED ORDER — SODIUM CHLORIDE 0.9 % IV SOLN: 10.0000 mg | Freq: Once | INTRAVENOUS | Status: DC

## 2019-05-12 MED ORDER — FAMOTIDINE IN NACL 20-0.9 MG/50ML-% IV SOLN
20.0000 mg | Freq: Once | INTRAVENOUS | Status: AC
  Administered 2019-05-12: 11:00:00 20 mg via INTRAVENOUS

## 2019-05-12 MED ORDER — MORPHINE SULFATE 4 MG/ML IJ SOLN
2.0000 mg | Freq: Once | INTRAMUSCULAR | Status: AC
  Administered 2019-05-12: 2 mg via INTRAVENOUS
  Filled 2019-05-12: qty 1

## 2019-05-12 MED ORDER — DIPHENHYDRAMINE HCL 25 MG PO CAPS
ORAL_CAPSULE | ORAL | Status: AC
  Filled 2019-05-12: qty 1

## 2019-05-12 MED ORDER — MORPHINE SULFATE (PF) 4 MG/ML IV SOLN
INTRAVENOUS | Status: AC
  Filled 2019-05-12: qty 1

## 2019-05-12 MED ORDER — SODIUM CHLORIDE 0.9 % IV SOLN
250.0000 mg | INTRAVENOUS | Status: DC
  Administered 2019-05-12: 250 mg via INTRAVENOUS
  Filled 2019-05-12: qty 20

## 2019-05-12 MED ORDER — SODIUM CHLORIDE 0.9 % IV SOLN
Freq: Once | INTRAVENOUS | Status: AC
  Administered 2019-05-12: 09:00:00 via INTRAVENOUS
  Filled 2019-05-12: qty 250

## 2019-05-12 MED ORDER — DEXAMETHASONE SODIUM PHOSPHATE 10 MG/ML IJ SOLN
10.0000 mg | Freq: Once | INTRAMUSCULAR | Status: AC
  Administered 2019-05-12: 11:00:00 10 mg via INTRAVENOUS

## 2019-05-12 MED ORDER — DEXAMETHASONE SODIUM PHOSPHATE 10 MG/ML IJ SOLN
INTRAMUSCULAR | Status: AC
  Filled 2019-05-12: qty 1

## 2019-05-12 MED ORDER — ACETAMINOPHEN 325 MG PO TABS
650.0000 mg | ORAL_TABLET | Freq: Once | ORAL | Status: AC
  Administered 2019-05-12: 09:00:00 650 mg via ORAL

## 2019-05-12 NOTE — Progress Notes (Signed)
At 1041 the patient began feeling a tingling/burning sensation up her right arm. The nurse stopped the infusion and called Sandi Mealy to bedside. Saline was ran and Mr. Tanner ordered pepcid. The patient then complained of sweating, left side pain/cramping and and nausea. See MAR for medications given. Vitals are shown in flowsheets and are stable.  The patient reports feeling better, pain and nausea has subsided.

## 2019-05-12 NOTE — Patient Instructions (Signed)
Sodium Ferric Gluconate Complex injection What is this medicine? SODIUM FERRIC GLUCONATE COMPLEX (SOE dee um FER ik GLOO koe nate KOM pleks) is an iron replacement. It is used with epoetin therapy to treat low iron levels in patients who are receiving hemodialysis. This medicine may be used for other purposes; ask your health care provider or pharmacist if you have questions. COMMON BRAND NAME(S): Ferrlecit, Nulecit What should I tell my health care provider before I take this medicine? They need to know if you have any of the following conditions:  anemia that is not from iron deficiency  high levels of iron in the body  an unusual or allergic reaction to iron, benzyl alcohol, other medicines, foods, dyes, or preservatives  pregnant or are trying to become pregnant  breast-feeding How should I use this medicine? This medicine is for infusion into a vein. It is given by a health care professional in a hospital or clinic setting. Talk to your pediatrician regarding the use of this medicine in children. While this drug may be prescribed for children as young as 6 years old for selected conditions, precautions do apply. Overdosage: If you think you have taken too much of this medicine contact a poison control center or emergency room at once. NOTE: This medicine is only for you. Do not share this medicine with others. What if I miss a dose? It is important not to miss your dose. Call your doctor or health care professional if you are unable to keep an appointment. What may interact with this medicine? Do not take this medicine with any of the following medications:  deferoxamine  dimercaprol  other iron products This medicine may also interact with the following medications:  chloramphenicol  deferasirox  medicine for blood pressure like enalapril This list may not describe all possible interactions. Give your health care provider a list of all the medicines, herbs,  non-prescription drugs, or dietary supplements you use. Also tell them if you smoke, drink alcohol, or use illegal drugs. Some items may interact with your medicine. What should I watch for while using this medicine? Your condition will be monitored carefully while you are receiving this medicine. Visit your doctor for check-ups as directed. What side effects may I notice from receiving this medicine? Side effects that you should report to your doctor or health care professional as soon as possible:  allergic reactions like skin rash, itching or hives, swelling of the face, lips, or tongue  breathing problems  changes in hearing  changes in vision  chills, flushing, or sweating  fast, irregular heartbeat  feeling faint or lightheaded, falls  fever, flu-like symptoms  high or low blood pressure  pain, tingling, numbness in the hands or feet  severe pain in the chest, back, flanks, or groin  swelling of the ankles, feet, hands  trouble passing urine or change in the amount of urine  unusually weak or tired Side effects that usually do not require medical attention (report to your doctor or health care professional if they continue or are bothersome):  cramps  dark colored stools  diarrhea  headache  nausea, vomiting  stomach upset This list may not describe all possible side effects. Call your doctor for medical advice about side effects. You may report side effects to FDA at 1-800-FDA-1088. Where should I keep my medicine? This drug is given in a hospital or clinic and will not be stored at home. NOTE: This sheet is a summary. It may not cover all   possible information. If you have questions about this medicine, talk to your doctor, pharmacist, or health care provider.  2020 Elsevier/Gold Standard (2008-06-30 15:58:57)   Iron Deficiency Anemia, Adult Iron-deficiency anemia is when you have a low amount of red blood cells or hemoglobin. This happens because you  have too little iron in your body. Hemoglobin carries oxygen to parts of the body. Anemia can cause your body to not get enough oxygen. It may or may not cause symptoms. Follow these instructions at home: Medicines  Take over-the-counter and prescription medicines only as told by your doctor. This includes iron pills (supplements) and vitamins.  If you cannot handle taking iron pills by mouth, ask your doctor about getting iron through: ? A vein (intravenously). ? A shot (injection) into a muscle.  Take iron pills when your stomach is empty. If you cannot handle this, take them with food.  Do not drink milk or take antacids at the same time as your iron pills.  To prevent trouble pooping (constipation), eat fiber or take medicine (stool softener) as told by your doctor. Eating and drinking   Talk with your doctor before changing the foods you eat. He or she may tell you to eat foods that have a lot of iron, such as: ? Liver. ? Lowfat (lean) beef. ? Breads and cereals that have iron added to them (fortified breads and cereals). ? Eggs. ? Dried fruit. ? Dark green, leafy vegetables.  Drink enough fluid to keep your pee (urine) clear or pale yellow.  Eat fresh fruits and vegetables that are high in vitamin C. They help your body to use iron. Foods with a lot of vitamin C include: ? Oranges. ? Peppers. ? Tomatoes. ? Mangoes. General instructions  Return to your normal activities as told by your doctor. Ask your doctor what activities are safe for you.  Keep yourself clean, and keep things clean around you (your surroundings). Anemia can make you get sick more easily.  Keep all follow-up visits as told by your doctor. This is important. Contact a doctor if:  You feel sick to your stomach (nauseous).  You throw up (vomit).  You feel weak.  You are sweating for no clear reason.  You have trouble pooping, such as: ? Pooping (having a bowel movement) less than 3 times a  week. ? Straining to poop. ? Having poop that is hard, dry, or larger than normal. ? Feeling full or bloated. ? Pain in the lower belly. ? Not feeling better after pooping. Get help right away if:  You pass out (faint). If this happens, do not drive yourself to the hospital. Call your local emergency services (911 in the [place]  You have chest pain.  You have shortness of breath that: ? Is very bad. ? Gets worse with physical activity.  You have a fast heartbeat.  You get light-headed when getting up from sitting or lying down. This information is not intended to replace advice given to you by your health care provider. Make sure you discuss any questions you have with your health care provider. Document Released: 12/01/2010 Document Revised: 10/11/2017 Document Reviewed: 07/18/2016 Elsevier Patient Education  2020 [employer].

## 2019-05-13 NOTE — Progress Notes (Signed)
    DATE:  05/12/2019                                          X  INFUSION REACTION             MD:  Dr. Sullivan Lone   AGENT/BLOOD [place]:               NULECIT   AGENT/BLOOD PRODUCT RECEIVING IMMEDIATELY PRIOR TO REACTION:          NULECIT   VS: BP:      109/80   P:        85          REACTION(S):            Sweating, nausea, left sided pain and cramping    PREMEDS:      None   INTERVENTION: NULECIT was stopped with 5-10 mL left to infuse.  She was given Pepcid 20 mg by the, Decadron 10 mg IV, Zofran 8 mg IV and morphine sulfate 2 mg IV.   Review of Systems  Review of Systems  Constitutional: Positive for diaphoresis. Negative for chills and fever.  HENT: Negative for trouble swallowing and voice change.   Respiratory: Negative for cough, chest tightness, shortness of breath and wheezing.   Cardiovascular: Negative for chest pain and palpitations.  Gastrointestinal: Positive for nausea. Negative for abdominal pain, constipation, diarrhea and vomiting.  Musculoskeletal: Positive for back pain. Negative for myalgias.  Neurological: Negative for dizziness, light-headedness and headaches.     Physical Exam  Physical Exam Constitutional:      General: She is not in acute distress.    Appearance: She is diaphoretic.  HENT:     Head: Normocephalic and atraumatic.  Cardiovascular:     Rate and Rhythm: Normal rate and regular rhythm.     Heart sounds: Normal heart sounds. No murmur. No friction rub. No gallop.   Pulmonary:     Effort: Pulmonary effort is normal. No respiratory distress.     Breath sounds: Normal breath sounds. No wheezing or rales.  Skin:    General: Skin is warm.     Findings: No erythema or rash.  Neurological:     Mental Status: She is alert.     OUTCOME:                NULECIT was stopped with 5-10 mL left to infuse.  The patient's symptoms abated and she was discharged to home after being observed for an additional 20 to 30 minutes.   Sandi Mealy, MHS, PA-C

## 2019-05-14 ENCOUNTER — Telehealth: Payer: Self-pay | Admitting: Medical

## 2019-05-14 NOTE — Telephone Encounter (Signed)
No los per 6/30. °

## 2019-05-18 ENCOUNTER — Encounter: Payer: Self-pay | Admitting: Hematology

## 2019-05-18 ENCOUNTER — Other Ambulatory Visit: Payer: Self-pay | Admitting: Hematology

## 2019-05-19 ENCOUNTER — Ambulatory Visit: Payer: 59 | Admitting: Psychiatry

## 2019-05-19 ENCOUNTER — Inpatient Hospital Stay: Payer: 59

## 2019-05-19 ENCOUNTER — Other Ambulatory Visit: Payer: Self-pay | Admitting: Hematology

## 2019-05-19 ENCOUNTER — Telehealth: Payer: Self-pay | Admitting: *Deleted

## 2019-05-19 NOTE — Telephone Encounter (Signed)
Appointment R/S for 05-26-2019.  See 05-19-2019 phone encounter.

## 2019-05-19 NOTE — Telephone Encounter (Signed)
Contacted patient in response to MyChart messages and message left with After Hours triage line. Patient cancelled appt for iron infusion this morning after having reaction last time. Stated she was scared to come back and try again. Also stated she did feel better following IV iron, despite reaction.  Advised her that Dr. Irene Limbo is ordering a different type of iron for next time, dependant on her insurance formulary. Once IV iron is ordered, office will be in contact with her regarding her next appointment(s) for Infusion. She verbalized understanding.

## 2019-05-20 ENCOUNTER — Other Ambulatory Visit: Payer: Self-pay

## 2019-05-20 ENCOUNTER — Other Ambulatory Visit: Payer: Self-pay | Admitting: Psychiatry

## 2019-05-20 MED ORDER — LORAZEPAM 1 MG PO TABS: 1.0000 mg | ORAL_TABLET | Freq: Every day | ORAL | 0 refills | Status: DC | PRN

## 2019-05-21 ENCOUNTER — Telehealth: Payer: Self-pay | Admitting: *Deleted

## 2019-05-21 NOTE — Telephone Encounter (Signed)
Attempted to contact patient regarding scheduling appointments for new IV iron orders for 4 doses of Venofer (1/week).  Voice Mail full

## 2019-05-26 ENCOUNTER — Encounter: Payer: Self-pay | Admitting: Hematology

## 2019-05-26 ENCOUNTER — Ambulatory Visit: Payer: 59

## 2019-05-26 NOTE — Telephone Encounter (Signed)
Spoke with patient at 8:15am. Appt for IV iron/Venofer scheduled for 8:30 this morning. Advised patient that attempts have been made to contact her, but that phone was not answered and VM box full.  Patient asked if she could come later today for iron. Contacted CN in infusion, unable to accommodate patient today if late due to current infusion room schedule. Contacted patient with this information. Patient verbalized understanding stating she was responsible for not being here on time, stating she was very nervous after having reaction last time. Informed patient that new IV iron was being used with premedications so this time might be different. Advised her that she will need 4 appointments, one each week sequentially, to receive all 4 doses of IV Venofer. Informed her that message will be sent scheduling to set up these appts. Patient verbalized understanding.

## 2019-05-27 ENCOUNTER — Telehealth: Payer: Self-pay | Admitting: Hematology

## 2019-05-27 ENCOUNTER — Other Ambulatory Visit: Payer: Self-pay

## 2019-05-27 ENCOUNTER — Telehealth: Payer: Self-pay | Admitting: Psychiatry

## 2019-05-27 MED ORDER — AMPHETAMINE-DEXTROAMPHET ER 20 MG PO CP24: 20.0000 mg | ORAL_CAPSULE | Freq: Two times a day (BID) | ORAL | 0 refills | Status: DC

## 2019-05-27 NOTE — Telephone Encounter (Signed)
Pended for approval.

## 2019-05-27 NOTE — Telephone Encounter (Signed)
Patient called and said that she needs a refill on her adderall 20mg  script to be [facility] to [facility] [address]. Next appt 7/27

## 2019-05-27 NOTE — Telephone Encounter (Signed)
Scheduled appt per 7/14 sch message- pt aware of appts added. Skipped a week due to patient being out of down

## 2019-05-29 ENCOUNTER — Other Ambulatory Visit: Payer: 59

## 2019-05-29 ENCOUNTER — Inpatient Hospital Stay: Payer: 59

## 2019-06-08 ENCOUNTER — Ambulatory Visit: Payer: 59 | Admitting: Psychiatry

## 2019-06-12 ENCOUNTER — Inpatient Hospital Stay: Payer: 59

## 2019-06-18 ENCOUNTER — Telehealth: Payer: Self-pay | Admitting: *Deleted

## 2019-06-18 NOTE — Telephone Encounter (Signed)
Contacted patient regarding appts on 8/7 and 8/14 for IV iron. Reached her husband. He states she is at [facility] and will be there for 2 1/2 more weeks so will not be keeping those appts. He states her HGB is going up,most recent HGB is 12.5. Thanked husband for information. Appts for 8/4 and 8/14 cancelled. Verified with husband that pt has appt with Dr. Irene Limbo 9/4. Dr. Irene Limbo notified of information.

## 2019-06-19 ENCOUNTER — Inpatient Hospital Stay: Payer: 59

## 2019-06-26 ENCOUNTER — Ambulatory Visit: Payer: 59

## 2019-07-15 ENCOUNTER — Telehealth: Payer: Self-pay

## 2019-07-15 NOTE — Telephone Encounter (Signed)
   Primary Cardiologist: Elouise Munroe, MD  Chart reviewed as part of pre-operative protocol coverage. Patient was contacted 07/15/2019 in reference to pre-operative risk assessment for pending surgery as outlined below.  Laura Chang was last seen on 11/20/2018 by [name], PA for cardiac clearance for hip surgery.  She was noted to have had a coronary CTA with calcium score of 0 and no evidence of CAD.  She was deemed acceptable risk for the surgery with no further cardiac work-up required.  Since that day, Laura Chang has had no new cardiac issues.  She is still being treated for anemia by hematology/oncology which the patient says has greatly improved since treatment for alcoholism.  Hemoglobin in epic was down to 9.7, however the patient states that it is now back up.  She should have anemia under good control prior to elective surgery.  Therefore, based on ACC/AHA guidelines, the patient would be at acceptable risk for the planned procedure without further cardiovascular testing.   I will route this recommendation to the requesting party via Epic fax function and remove from pre-op pool.  Please call with questions.  [name], NP 07/15/2019, 1:51 PM

## 2019-07-15 NOTE — Telephone Encounter (Signed)
   [facility] Pre-operative Risk Assessment    Request for surgical clearance:  1. What type of surgery is being performed? RIGHT HIP ARTHROPLASTY    2. When is this surgery scheduled? TBD   3. What type of clearance is required (medical clearance vs. Pharmacy clearance to hold med vs. Both)?  MEDICAL   4. Are there any medications that need to be held prior to surgery and how long?  NO   5. Practice name and name of physician performing surgery? [facility]   6. What is your office phone number [phone]    7.   What is your office fax number [phone]  8.   Anesthesia type (None, local, MAC, general) ? SPINAL   Waylan Rocher 07/15/2019, 12:07 PM  _________________________________________________________________   (provider comments below)

## 2019-07-16 ENCOUNTER — Other Ambulatory Visit: Payer: Self-pay | Admitting: *Deleted

## 2019-07-16 DIAGNOSIS — D500 Iron deficiency anemia secondary to blood loss (chronic): Secondary | ICD-10-CM

## 2019-07-16 NOTE — Progress Notes (Signed)
HEMATOLOGY/ONCOLOGY CLINIC NOTE  Date of Service: 07/16/2019  Patient Care Team: Deatra James, MD as PCP - General (Family Medicine) Parke Poisson, MD as PCP - Cardiology (Cardiology)  CHIEF COMPLAINTS/PURPOSE OF CONSULTATION:  Iron deficiency anemia  HISTORY OF PRESENTING ILLNESS:   Laura Chang is a  63 y.o. female who has been referred to [place] by Dr. Deatra James for evaluation and management of Iron deficiency anemia. The pt reports that she is doing well overall.  The pt reports that she has had anemia for more than 5 years. She has recently seen Dr. Jerl Santos for considerations of a right hip replacement.  The pt notes that she began Meloxicam and 600mg  Ibuprofen in August 2019 after a peridontal procedure. She then developed "out of the blue" extreme fatigue, which she notes was similar to a previous time when her HGB was in the 4s, a few years ago. She then stopped taking NSAIDs, established care with PCP Dr. Wynelle Link, and was referred to GI Dr. Randa Evens.   She notes that she then had an endoscopy and colonoscopy which was significant for a large hiatal hernia and ulcerations at the gastroesophageal junction. She then began 325mg  Ferrous Sulfate in November 2019.   The pt also notes that she contracted a GI virus and developed significant diarrhea within the last month.   The pt notes that she has had blood transfusions and received an IV Iron transfusion once in the past without complication. The pt notes that she has felt recently tired. She denies abdominal pains or leg swelling.  The pt notes that she was consuming "lots of alcohol 3-4 years ago." She notes that she has decreased her consumption to about 8-14 drinks of alcohol per week currently.   Most recent lab results (12/12/18) of CBC w/diff and BMP is as follows: all values are WNL except for RBC at 3.39, HGB at 7.5, HCT at 27.4, MCH at 22.1, MCHC at 27.4, PLT at 419k, Glucose at 102.  On review of systems, pt reports  recent concern for infection, lower energy levels, and denies pain along the spine, abdominal pains, leg swelling, and any other symptoms.  On PMHx the pt reports Iron deficiency anemia, ADD. On Social Hx the pt reports quit smoking cigarettes in 1980s, Consumes 8-14 drinks of alcohol per week. On Family Hx the pt reports father with heart attack at 45 years old.   INTERVAL HISTORY:   [place] D Buccellato returns today for management and evaluation of her Iron deficiency anemia. The patient's last visit with [place] was on 04/17/2019. The pt reports that she is doing well overall.  The pt reports she has completed her inpatient ETOH rehab [facility] and has been sober forabout 1.5 months and notes that she is feelign much better. Notes no overt GI bleeding  Lab results today (07/16/19) of CBC w/diff and CMP is as follows: all values are WNL  On review of systems, pt reports good energy level  and denies abdominal pain or overt GI bleeding and any other symptoms.    MEDICAL HISTORY:  Past Medical History:  Diagnosis Date  . ADHD   . Anemia   . Anxiety   . Arthritis   . Depression   . Heart murmur   . PONV (postoperative nausea and vomiting)     SURGICAL HISTORY: Past Surgical History:  Procedure Laterality Date  . CHOLECYSTECTOMY    . COLONOSCOPY  10/25/2012   Procedure: COLONOSCOPY;  Surgeon: Jonny Ruiz  Charolette Forward, MD;  [facility] END[facility]COPY;  Service: Endoscopy;  Laterality: N/A;  . ESOPHAGOGASTRODUODENOSCOPY  10/24/2012   Procedure: ESOPHAGOGASTRODUODENOSCOPY (EGD);  Surgeon: Winfield Cunas., MD;  Location: [facility] ENDOSCOPY;  Service: Endoscopy;  Laterality: N/A;    SOCIAL HISTORY: Social History   Socioeconomic History  . Marital status: Married    Spouse name: Not on file  . Number of children: Not on file  . Years of education: Not on file  . Highest education level: Not on file  Occupational History  . Not on file  Social Needs  . Financial resource strain: Not on file   . Food insecurity    Worry: Not on file    Inability: Not on file  . Transportation needs    Medical: Not on file    Non-medical: Not on file  Tobacco Use  . Smoking status: Former Smoker    Quit date: 08/16/2003    Years since quitting: 15.9  . Smokeless tobacco: Never Used  Substance and Sexual Activity  . Alcohol use: Yes    Alcohol/week: 10.0 standard drinks    Types: 10 Glasses of wine per week  . Drug use: No  . Sexual activity: Not on file  Lifestyle  . Physical activity    Days per week: Not on file    Minutes per session: Not on file  . Stress: Not on file  Relationships  . Social Herbalist on phone: Not on file    Gets together: Not on file    Attends religious service: Not on file    Active member of club or organization: Not on file    Attends meetings of clubs or organizations: Not on file    Relationship status: Not on file  . Intimate partner violence    Fear of current or ex partner: Not on file    Emotionally abused: Not on file    Physically abused: Not on file    Forced sexual activity: Not on file  Other Topics Concern  . Not on file  Social History Narrative  . Not on file    FAMILY HISTORY: Family History  Problem Relation Age of Onset  . Atrial fibrillation Mother   . CAD Father     ALLERGIES:  is allergic to metronidazole; ferrlecit [na ferric gluc cplx in sucrose]; and meloxicam.  MEDICATIONS:  Current Outpatient Medications  Medication Sig Dispense Refill  . acetaminophen (TYLENOL) 325 MG tablet Take 650 mg by mouth every 6 (six) hours as needed for moderate pain.    Marland Kitchen amphetamine-dextroamphetamine (ADDERALL XR) 20 MG 24 hr capsule Take 1 capsule (20 mg total) by mouth 2 (two) times daily. 60 capsule 0  . amphetamine-dextroamphetamine (ADDERALL XR) 20 MG 24 hr capsule Take 1 capsule (20 mg total) by mouth 2 (two) times daily. 60 capsule 0  . FLUoxetine (PROZAC) 20 MG capsule TAKE 3 CAPSULES BY MOUTH EVERY DAY 90 capsule 0   . LORazepam (ATIVAN) 1 MG tablet Take 1 tablet (1 mg total) by mouth daily as needed for anxiety. 20 tablet 0  . pantoprazole (PROTONIX) 40 MG tablet Take 1 tablet (40 mg total) by mouth daily. 30 tablet 0   No current facility-administered medications for this visit.     REVIEW OF SYSTEMS:   A 10+ POINT REVIEW OF SYSTEMS WAS OBTAINED including neurology, dermatology, psychiatry, cardiac, respiratory, lymph, extremities, GI, GU, Musculoskeletal, constitutional, breasts, reproductive, HEENT.  All pertinent positives are noted in the  HPI.  All others are negative.    PHYSICAL EXAMINATION:  . Vitals:   07/17/19 1040  BP: (!) 150/92  Pulse: 77  Resp: 18  Temp: 98.7 F (37.1 C)  SpO2: 99%   Filed Weights   07/17/19 1040  Weight: 174 lb 4.8 oz (79.1 kg)   Body mass index is 29.01 kg/m.  GENERAL:alert, in no acute distress and comfortable SKIN: no acute rashes, no significant lesions EYES: conjunctiva are pink and non-injected, sclera anicteric OROPHARYNX: MMM, no exudates, no oropharyngeal erythema or ulceration NECK: supple, no JVD LYMPH:  no palpable lymphadenopathy in the cervical, axillary or inguinal regions LUNGS: clear to auscultation b/l with normal respiratory effort HEART: regular rate & rhythm ABDOMEN:  normoactive bowel sounds , non tender, not distended. Extremity: no pedal edema PSYCH: alert & oriented x 3 with fluent speech NEURO: no focal motor/sensory deficits    LABORATORY DATA:  I have reviewed the data as listed  . CBC Latest Ref Rng & Units 07/17/2019 04/17/2019 01/27/2019  WBC 4.0 - 10.5 K/uL 4.2 3.7(L) 4.3  Hemoglobin 12.0 - 15.0 g/dL 40.912.9 8.1(X9.7(L) 91.412.8  Hematocrit 36.0 - 46.0 % 39.7 33.0(L) 43.1  Platelets 150 - 400 K/uL 286 395 307    . CMP Latest Ref Rng & Units 01/27/2019 12/27/2018 12/26/2018  Glucose 70 - 99 mg/dL 782(N104(H) 562(Z114(H) 308(M102(H)  BUN 8 - 23 mg/dL 16 12 14   Creatinine 0.44 - 1.00 mg/dL 5.780.78 4.690.73 6.290.63  Sodium 135 - 145 mmol/L 142 140 141   Potassium 3.5 - 5.1 mmol/L 4.1 3.7 3.9  Chloride 98 - 111 mmol/L 109 110 110  CO2 22 - 32 mmol/L 20(L) 21(L) 22  Calcium 8.9 - 10.3 mg/dL 9.3 8.9 5.2(W8.8(L)  Total Protein 6.5 - 8.1 g/dL 7.1 - -  Total Bilirubin 0.3 - 1.2 mg/dL 0.5 - -  Alkaline Phos 38 - 126 U/L 54 - -  AST 15 - 41 U/L 14(L) - -  ALT 0 - 44 U/L 14 - -    . Lab Results  Component Value Date   IRON 15 (L) 04/17/2019   TIBC 445 (H) 04/17/2019   IRONPCTSAT 3 (L) 04/17/2019   (Iron and TIBC)  Lab Results  Component Value Date   FERRITIN <4 (L) 04/17/2019   . Lab Results  Component Value Date   IRON 80 07/17/2019   TIBC 348 07/17/2019   IRONPCTSAT 23 07/17/2019   (Iron and TIBC)  Lab Results  Component Value Date   FERRITIN 19 07/17/2019     RADIOGRAPHIC STUDIES: I have personally reviewed the radiological images as listed and agreed with the findings in the report. No results found.  ASSESSMENT & PLAN:   63 y.o. female with  1. Iron deficiency anemia Recurrent and today very severe. Likely from ongoing GI bleeding probably from her significant hiatal hernia and cameron ulcerations + NSAID use + ETOH abuse. Frequent NSAID use +Prozac an additional risk factors in addition to [facility] + Cameron ulcers  [place] upon initial presentation from 12/12/18, HGB at 7.5 with MCV at 80.8. Normal creatinine  2. Reaction to IV Ferrelecit PLAN -Discussed pt labwork today, 07/16/19; hgb has normalized at 12.9 -ferritin is 19 -- improved but still low. She choose not to complete her planned IV Iron infusions -would recommend PO Iron polysaccharide 150mg  po Daily -hgb is adequate to pursue planned hip surgery -she will need GI clearance for anticoagulation post THA and medical clearance for surgery from PCP  FOLLOW UP: RTC  with dr Candise CheKale with labs in 6 months   The total time spent in the appt was 20 minutes and more than 50% was on counseling and direct patient cares.  All of the patient's questions were answered with  apparent satisfaction. The patient knows to call the clinic with any problems, questions or concerns.     Wyvonnia LoraGautam Kale MD MS AAHIVMS [facility] Hematology/Oncology [facility]  (Office):       [phone] (Work cell):  [phone] (Fax):           [phone]  07/16/2019 11:20 PM  I, Mal MistyAmber Handy, am acting as a Neurosurgeonscribe for Dr. Wyvonnia LoraGautam Kale.   .I have reviewed the above documentation for accuracy and completeness, and I agree with the above. Johney [place].Gautam Kishore Kale MD

## 2019-07-17 ENCOUNTER — Inpatient Hospital Stay: Payer: 59 | Attending: Hematology

## 2019-07-17 ENCOUNTER — Telehealth: Payer: Self-pay | Admitting: Hematology

## 2019-07-17 ENCOUNTER — Inpatient Hospital Stay (HOSPITAL_BASED_OUTPATIENT_CLINIC_OR_DEPARTMENT_OTHER): Payer: 59 | Admitting: Hematology

## 2019-07-17 ENCOUNTER — Telehealth: Payer: Self-pay | Admitting: *Deleted

## 2019-07-17 ENCOUNTER — Other Ambulatory Visit: Payer: Self-pay

## 2019-07-17 VITALS — BP 150/92 | HR 77 | Temp 98.7°F | Resp 18 | Ht 65.0 in | Wt 174.3 lb

## 2019-07-17 DIAGNOSIS — D508 Other iron deficiency anemias: Secondary | ICD-10-CM | POA: Diagnosis not present

## 2019-07-17 DIAGNOSIS — K449 Diaphragmatic hernia without obstruction or gangrene: Secondary | ICD-10-CM | POA: Insufficient documentation

## 2019-07-17 DIAGNOSIS — Z87891 Personal history of nicotine dependence: Secondary | ICD-10-CM | POA: Diagnosis not present

## 2019-07-17 DIAGNOSIS — F418 Other specified anxiety disorders: Secondary | ICD-10-CM | POA: Diagnosis not present

## 2019-07-17 DIAGNOSIS — Z79899 Other long term (current) drug therapy: Secondary | ICD-10-CM | POA: Diagnosis not present

## 2019-07-17 DIAGNOSIS — D509 Iron deficiency anemia, unspecified: Secondary | ICD-10-CM | POA: Diagnosis present

## 2019-07-17 DIAGNOSIS — D500 Iron deficiency anemia secondary to blood loss (chronic): Secondary | ICD-10-CM

## 2019-07-17 LAB — CBC WITH DIFFERENTIAL (CANCER CENTER ONLY)
Abs Immature Granulocytes: 0.02 10*3/uL (ref 0.00–0.07)
Basophils Absolute: 0 10*3/uL (ref 0.0–0.1)
Basophils Relative: 1 %
Eosinophils Absolute: 0.2 10*3/uL (ref 0.0–0.5)
Eosinophils Relative: 4 %
HCT: 39.7 % (ref 36.0–46.0)
Hemoglobin: 12.9 g/dL (ref 12.0–15.0)
Immature Granulocytes: 1 %
Lymphocytes Relative: 30 %
Lymphs Abs: 1.3 10*3/uL (ref 0.7–4.0)
MCH: 29.3 pg (ref 26.0–34.0)
MCHC: 32.5 g/dL (ref 30.0–36.0)
MCV: 90.2 fL (ref 80.0–100.0)
Monocytes Absolute: 0.5 10*3/uL (ref 0.1–1.0)
Monocytes Relative: 11 %
Neutro Abs: 2.3 10*3/uL (ref 1.7–7.7)
Neutrophils Relative %: 53 %
Platelet Count: 286 10*3/uL (ref 150–400)
RBC: 4.4 MIL/uL (ref 3.87–5.11)
RDW: 13.4 % (ref 11.5–15.5)
WBC Count: 4.2 10*3/uL (ref 4.0–10.5)
nRBC: 0 % (ref 0.0–0.2)

## 2019-07-17 LAB — IRON AND TIBC
Iron: 80 ug/dL (ref 41–142)
Saturation Ratios: 23 % (ref 21–57)
TIBC: 348 ug/dL (ref 236–444)
UIBC: 268 ug/dL (ref 120–384)

## 2019-07-17 LAB — FERRITIN: Ferritin: 19 ng/mL (ref 11–307)

## 2019-07-17 NOTE — Telephone Encounter (Signed)
Faxed Surgical Clearance form and recent labs w/ Dr. Grier Mitts recommendations/instructions to Dr. Jerald Kief office [phone] for patient. Fax confirmation received. Copy sent to HIM to be scanned

## 2019-07-17 NOTE — Telephone Encounter (Signed)
Scheduled appt per 9/4 los.  A calendar will be mailed out.

## 2019-07-23 ENCOUNTER — Telehealth: Payer: Self-pay | Admitting: *Deleted

## 2019-07-23 NOTE — Telephone Encounter (Signed)
Attempted to contact patient at home/mobile number. Left voice mail with instructions as directed by Dr. Irene Limbo in message below. Encouraged to contact office for questions.

## 2019-07-23 NOTE — Telephone Encounter (Signed)
-----   Message from Brunetta Genera, MD sent at 07/23/2019  1:38 AM EDT ----- would recommend patient start taking OTC PO Iron polysaccharide 150mg  po Daily for mild iron deficiency at this time.

## 2019-08-03 ENCOUNTER — Other Ambulatory Visit: Payer: Self-pay | Admitting: Orthopaedic Surgery

## 2019-08-27 NOTE — Patient Instructions (Addendum)
DUE TO COVID-19 ONLY ONE VISITOR IS ALLOWED TO COME WITH YOU AND STAY IN THE WAITING ROOM ONLY DURING PRE OP AND PROCEDURE DAY OF SURGERY. THE 1 VISITOR MAY VISIT WITH YOU AFTER SURGERY IN YOUR PRIVATE ROOM DURING VISITING HOURS ONLY!  YOU NEED TO HAVE A COVID 19 TEST ON___10-16____ @___10 :00AM___, THIS TEST MUST BE DONE BEFORE SURGERY, COME  [address], [place]  , [phone].  [facility] ONCE YOUR COVID TEST IS COMPLETED, PLEASE BEGIN THE QUARANTINE INSTRUCTIONS AS OUTLINED IN YOUR HANDOUT.                Laura Chang    Your procedure is scheduled on: 10-20   [facility]   Report to admitting at 7:15AM     Call this number if you have problems the morning of surgery [phone]     Do not eat food After Midnight. YOU MAY HAVE CLEAR LIQUIDS FROM MIDNIGHT UNTIL 6:43M. At 6:45AM Please finish the prescribed Pre-Surgery ENSURE drink. Nothing by mouth after you finish the ENSURE drink !   CLEAR LIQUID DIET   Foods Allowed                                                                     Foods Excluded  Coffee and tea, regular and decaf                             liquids that you cannot  Plain Jell-O any favor except red or purple                                           see through such as: Fruit ices (not with fruit pulp)                                     milk, soups, orange juice  Iced Popsicles                                    All solid food Carbonated beverages, regular and diet                                    Cranberry, grape and apple juices Sports drinks like Gatorade Lightly seasoned clear broth or consume(fat free) Sugar, honey syrup  Sample Menu Breakfast                                Lunch                                     Supper Cranberry juice  Beef broth                            Chicken broth Jell-O                                     Grape juice                            Apple juice Coffee or tea                        Jell-O                                      Popsicle                                                Coffee or tea                        Coffee or tea  _____________________________________________________________________  BRUSH YOUR TEETH MORNING OF SURGERY AND RINSE YOUR MOUTH OUT, NO CHEWING GUM CANDY OR MINTS.     Take these medicines the morning of surgery with A SIP OF WATER: WELLBUTRIN                                 You may not have any metal on your body including hair pins and              piercings  Do not wear jewelry, make-up, lotions, powders or perfumes, deodorant             Do not wear nail polish on your fingernails.  Do not shave  48 hours prior to surgery.     Do not bring valuables to the hospital. [place] IS NOT             RESPONSIBLE   FOR VALUABLES.  Contacts, dentures or bridgework may not be worn into surgery.  YOU MAY BRING A SMALL OVERNIGHT BAG              Please read over the following fact sheets you were given: _____________________________________________________________________             [facility] - Preparing for Surgery Before surgery, you can play an important role.  Because skin is not sterile, your skin needs to be as free of germs as possible.  You can reduce the number of germs on your skin by washing with CHG (chlorahexidine gluconate) soap before surgery.  CHG is an antiseptic cleaner which kills germs and bonds with the skin to continue killing germs even after washing. Please DO NOT use if you have an allergy to CHG or antibacterial soaps.  If your skin becomes reddened/irritated stop using the CHG and inform your nurse when you arrive at Short Stay. Do not shave (including legs and underarms) for at least 48 hours prior to the first CHG shower.  You may shave your face/neck. Please follow these instructions carefully:  1.  Shower with CHG Soap the night before surgery and the   morning of Surgery.  2.  If you choose to wash your hair, wash your hair first as usual with your  normal  shampoo.  3.  After you shampoo, rinse your hair and body thoroughly to remove the  shampoo.                           4.  Use CHG as you would any other liquid soap.  You can apply chg directly  to the skin and wash                       Gently with a scrungie or clean washcloth.  5.  Apply the CHG Soap to your body ONLY FROM THE NECK DOWN.   Do not use on face/ open                           Wound or open sores. Avoid contact with eyes, ears mouth and genitals (private parts).                       Wash face,  Genitals (private parts) with your normal soap.             6.  Wash thoroughly, paying special attention to the area where your surgery  will be performed.  7.  Thoroughly rinse your body with warm water from the neck down.  8.  DO NOT shower/wash with your normal soap after using and rinsing off  the CHG Soap.                9.  Pat yourself dry with a clean towel.            10.  Wear clean pajamas.            11.  Place clean sheets on your bed the night of your first shower and do not  sleep with pets. Day of Surgery : Do not apply any lotions/deodorants the morning of surgery.  Please wear clean clothes to the hospital/surgery center.  FAILURE TO FOLLOW THESE INSTRUCTIONS MAY RESULT IN THE CANCELLATION OF YOUR SURGERY PATIENT SIGNATURE_________________________________  NURSE SIGNATURE__________________________________  ________________________________________________________________________   Laura Chang  An incentive spirometer is a tool that can help keep your lungs clear and active. This tool measures how well you are filling your lungs with each breath. Taking long deep breaths may help reverse or decrease the chance of developing breathing (pulmonary) problems (especially infection) following:  A long period of time when you are unable to move or be  active. BEFORE THE PROCEDURE   If the spirometer includes an indicator to show your best effort, your nurse or respiratory therapist will set it to a desired goal.  If possible, sit up straight or lean slightly forward. Try not to slouch.  Hold the incentive spirometer in an upright position. INSTRUCTIONS FOR USE  1. Sit on the edge of your bed if possible, or sit up as far as you can in bed or on a chair. 2. Hold the incentive spirometer in an upright position. 3. Breathe out normally. 4. Place the mouthpiece in your mouth and seal your lips tightly around it. 5. Breathe in slowly and as deeply as possible, raising the piston or the ball toward the top of the  column. 6. Hold your breath for 3-5 seconds or for as long as possible. Allow the piston or ball to fall to the bottom of the column. 7. Remove the mouthpiece from your mouth and breathe out normally. 8. Rest for a few seconds and repeat Steps 1 through 7 at least 10 times every 1-2 hours when you are awake. Take your time and take a few normal breaths between deep breaths. 9. The spirometer may include an indicator to show your best effort. Use the indicator as a goal to work toward during each repetition. 10. After each set of 10 deep breaths, practice coughing to be sure your lungs are clear. If you have an incision (the cut made at the time of surgery), support your incision when coughing by placing a pillow or rolled up towels firmly against it. Once you are able to get out of bed, walk around indoors and cough well. You may stop using the incentive spirometer when instructed by your caregiver.  RISKS AND COMPLICATIONS  Take your time so you do not get dizzy or light-headed.  If you are in pain, you may need to take or ask for pain medication before doing incentive spirometry. It is harder to take a deep breath if you are having pain. AFTER USE  Rest and breathe slowly and easily.  It can be helpful to keep track of a log of  your progress. Your caregiver can provide you with a simple table to help with this. If you are using the spirometer at home, follow these instructions: [place] IF:   You are having difficultly using the spirometer.  You have trouble using the spirometer as often as instructed.  Your pain medication is not giving enough relief while using the spirometer.  You develop fever of 100.5 F (38.1 C) or higher. SEEK IMMEDIATE MEDICAL CARE IF:   You cough up bloody sputum that had not been present before.  You develop fever of 102 F (38.9 C) or greater.  You develop worsening pain at or near the incision site. MAKE SURE YOU:   Understand these instructions.  Will watch your condition.  Will get help right away if you are not doing well or get worse. Document Released: 03/11/2007 Document Revised: 01/21/2012 Document Reviewed: 05/12/2007 [facility] Patient Information 2014 [place], [place].   ________________________________________________________________________

## 2019-08-28 ENCOUNTER — Other Ambulatory Visit: Payer: Self-pay

## 2019-08-28 ENCOUNTER — Encounter (HOSPITAL_COMMUNITY)
Admission: RE | Admit: 2019-08-28 | Discharge: 2019-08-28 | Disposition: A | Discharge status: Home / Self Care | Payer: 59 | Source: Ambulatory Visit | Attending: Orthopaedic Surgery | Admitting: Orthopaedic Surgery

## 2019-08-28 ENCOUNTER — Ambulatory Visit (HOSPITAL_COMMUNITY)
Admission: RE | Admit: 2019-08-28 | Discharge: 2019-08-28 | Disposition: A | Discharge status: Home / Self Care | Payer: 59 | Source: Ambulatory Visit | Attending: Orthopaedic Surgery | Admitting: Orthopaedic Surgery

## 2019-08-28 ENCOUNTER — Other Ambulatory Visit (HOSPITAL_COMMUNITY)
Admission: RE | Admit: 2019-08-28 | Discharge: 2019-08-28 | Disposition: A | Discharge status: Home / Self Care | Payer: 59 | Source: Ambulatory Visit | Attending: Orthopaedic Surgery | Admitting: Orthopaedic Surgery

## 2019-08-28 ENCOUNTER — Encounter (HOSPITAL_COMMUNITY): Payer: Self-pay

## 2019-08-28 DIAGNOSIS — Z01818 Encounter for other preprocedural examination: Secondary | ICD-10-CM | POA: Insufficient documentation

## 2019-08-28 HISTORY — DX: Alcohol dependence, in remission: F10.21

## 2019-08-28 LAB — URINALYSIS, ROUTINE W REFLEX MICROSCOPIC
Bilirubin Urine: NEGATIVE
Glucose, UA: NEGATIVE mg/dL
Hgb urine dipstick: NEGATIVE
Ketones, ur: NEGATIVE mg/dL
Leukocytes,Ua: NEGATIVE
Nitrite: NEGATIVE
Protein, ur: NEGATIVE mg/dL
Specific Gravity, Urine: 1.018 (ref 1.005–1.030)
pH: 5 (ref 5.0–8.0)

## 2019-08-28 LAB — CBC WITH DIFFERENTIAL/PLATELET
Abs Immature Granulocytes: 0 10*3/uL (ref 0.00–0.07)
Basophils Absolute: 0 10*3/uL (ref 0.0–0.1)
Basophils Relative: 1 %
Eosinophils Absolute: 0.1 10*3/uL (ref 0.0–0.5)
Eosinophils Relative: 3 %
HCT: 34.1 % — ABNORMAL LOW (ref 36.0–46.0)
Hemoglobin: 11.6 g/dL — ABNORMAL LOW (ref 12.0–15.0)
Immature Granulocytes: 0 %
Lymphocytes Relative: 34 %
Lymphs Abs: 1.2 10*3/uL (ref 0.7–4.0)
MCH: 33.2 pg (ref 26.0–34.0)
MCHC: 34 g/dL (ref 30.0–36.0)
MCV: 97.7 fL (ref 80.0–100.0)
Monocytes Absolute: 0.4 10*3/uL (ref 0.1–1.0)
Monocytes Relative: 11 %
Neutro Abs: 1.8 10*3/uL (ref 1.7–7.7)
Neutrophils Relative %: 51 %
Platelets: 276 10*3/uL (ref 150–400)
RBC: 3.49 MIL/uL — ABNORMAL LOW (ref 3.87–5.11)
RDW: 13.4 % (ref 11.5–15.5)
WBC: 3.5 10*3/uL — ABNORMAL LOW (ref 4.0–10.5)
nRBC: 0 % (ref 0.0–0.2)

## 2019-08-28 LAB — SURGICAL PCR SCREEN
MRSA, PCR: NEGATIVE
Staphylococcus aureus: NEGATIVE

## 2019-08-28 LAB — PROTIME-INR
INR: 0.9 (ref 0.8–1.2)
Prothrombin Time: 12.3 seconds (ref 11.4–15.2)

## 2019-08-28 LAB — RAPID URINE DRUG SCREEN, HOSP PERFORMED
Amphetamines: NOT DETECTED
Barbiturates: NOT DETECTED
Benzodiazepines: POSITIVE — AB
Cocaine: NOT DETECTED
Opiates: NOT DETECTED
Tetrahydrocannabinol: NOT DETECTED

## 2019-08-28 LAB — BASIC METABOLIC PANEL
Anion gap: 9 (ref 5–15)
BUN: 18 mg/dL (ref 8–23)
CO2: 25 mmol/L (ref 22–32)
Calcium: 9.4 mg/dL (ref 8.9–10.3)
Chloride: 105 mmol/L (ref 98–111)
Creatinine, Ser: 0.79 mg/dL (ref 0.44–1.00)
GFR calc Af Amer: >60 mL/min (ref >60)
GFR calc non Af Amer: >60 mL/min (ref >60)
Glucose, Bld: 108 mg/dL — ABNORMAL HIGH (ref 70–99)
Potassium: 4.4 mmol/L (ref 3.5–5.1)
Sodium: 139 mmol/L (ref 135–145)

## 2019-08-28 LAB — APTT: aPTT: 29 seconds (ref 24–36)

## 2019-08-28 LAB — ETHANOL: Alcohol, Ethyl (B): <10 mg/dL (ref <10)

## 2019-08-28 LAB — ABO/RH: ABO/RH(D): A POS

## 2019-08-28 NOTE — Progress Notes (Signed)
PCP - Gwyndolyn Kaufman, PA-C Cardiologist - Elouise Munroe, MD  Chest x-ray -  EKG - DONE TODAY  Stress Test -  ECHO -  Cardiac Cath -   Sleep Study -  CPAP -   Fasting Blood Sugar -  Checks Blood Sugar _____ times a day  Blood Thinner Instructions: Aspirin Instructions: Last Dose:  Anesthesia review:   Cardiac clearance [name], NP epic 07-15-2019  Patient denies shortness of breath, fever, cough and chest pain at PAT appointment   Patient verbalized understanding of instructions that were given to them at the PAT appointment. Patient was also instructed that they will need to review over the PAT instructions again at home before surgery.

## 2019-08-30 LAB — NOVEL CORONAVIRUS, NAA (HOSP ORDER, SEND-OUT TO REF LAB; TAT 18-24 HRS): SARS-CoV-2, NAA: NOT DETECTED

## 2019-08-31 MED ORDER — TRANEXAMIC ACID 1000 MG/10ML IV SOLN
2000.0000 mg | INTRAVENOUS | Status: DC
  Filled 2019-08-31: qty 20

## 2019-08-31 MED ORDER — BUPIVACAINE LIPOSOME 1.3 % IJ SUSP
10.0000 mL | Freq: Once | INTRAMUSCULAR | Status: DC
  Filled 2019-08-31: qty 10

## 2019-08-31 NOTE — Anesthesia Preprocedure Evaluation (Addendum)
Anesthesia Evaluation  Patient identified by MRN, date of birth, ID band Patient awake    Reviewed: Allergy & Precautions, NPO status , Patient's Chart, lab work & pertinent test results  History of Anesthesia Complications (+) PONV  Airway Mallampati: II  TM Distance: >3 FB Neck ROM: Full    Dental no notable dental hx.    Pulmonary former smoker,    Pulmonary exam normal breath sounds clear to auscultation       Cardiovascular negative cardio ROS Normal cardiovascular exam Rhythm:Regular Rate:Normal     Neuro/Psych Anxiety Depression negative neurological ROS     GI/Hepatic negative GI ROS, Neg liver ROS,   Endo/Other  negative endocrine ROS  Renal/GU negative Renal ROS  negative genitourinary   Musculoskeletal  (+) Arthritis , Osteoarthritis,    Abdominal   Peds negative pediatric ROS (+)  Hematology negative hematology ROS (+)   Anesthesia Other Findings   Reproductive/Obstetrics negative OB ROS                            Anesthesia Physical Anesthesia Plan  ASA: II  Anesthesia Plan: Spinal   Post-op Pain Management:    Induction: Intravenous  PONV Risk Score and Plan: 3 and Ondansetron, Dexamethasone, Midazolam and Treatment may vary due to age or medical condition  Airway Management Planned: Simple Face Mask  Additional Equipment:   Intra-op Plan:   Post-operative Plan:   Informed Consent: I have reviewed the patients History and Physical, chart, labs and discussed the procedure including the risks, benefits and alternatives for the proposed anesthesia with the patient or authorized representative who has indicated his/her understanding and acceptance.     Dental advisory given  Plan Discussed with: CRNA  Anesthesia Plan Comments: (See PAT note 08/28/2019, Konrad Felix, PA-C)       Anesthesia Quick Evaluation

## 2019-08-31 NOTE — Progress Notes (Signed)
Anesthesia Chart Review   Case: [id number] Date/Time: 09/01/19 0932   Procedure: RIGHT TOTAL HIP ARTHROPLASTY ANTERIOR APPROACH (Right Hip)   Anesthesia type: Spinal   Pre-op diagnosis: Right Hip Degenertive Joint Disease   Location: [place] [facility] / [facility]   Surgeon: Laura Nakayama, MD      DISCUSSION:63 y.o. former smoker (quit 08/16/03) with h/o PONV, anxiety, recovering alcoholic (completed rehab, sober for about 3 months), iron deficiency anemia, hiatal hernia, right hip DJD scheduled for above procedure 09/01/2019 with Dr. Melrose Chang.   Cleared by cardiology 07/15/2019.  Per Laura Perch, NP, "[facility] was last seen on 11/20/2018 by Laura Lofts, PA for cardiac clearance for hip surgery.  She was noted to have had a coronary CTA with calcium score of 0 and no evidence of CAD.  She was deemed acceptable risk for the surgery with no further cardiac work-up required.  Since that day, [facility] has had no new cardiac issues.  She is still being treated for anemia by hematology/oncology which the patient says has greatly improved since treatment for alcoholism.  Hemoglobin in epic was down to 9.7, however the patient states that it is now back up.  She should have anemia under good control prior to elective surgery. Therefore, based on ACC/AHA guidelines, the patient would be at acceptable risk for the planned procedure without further cardiovascular testing."  Anemia followed by [facility].  Last seen 07/17/2019.  Per OV note, "hgb is adequate to pursue planned hip surgery" VS: BP 120/80   Pulse 64   Temp 36.7 C   Resp 16   Ht 5\' 5"  (1.651 m)   Wt 78.5 kg   SpO2 97%   BMI 28.79 kg/m   PROVIDERS: Laura Kaufman, PA-C is PCP   Laura Kaiser, MD is Cardiologist  LABS: Labs reviewed: Acceptable for surgery. (all labs ordered are listed, but only abnormal results are displayed)  Labs Reviewed  BASIC METABOLIC PANEL - Abnormal; Notable for the following  components:      Result Value   Glucose, Bld 108 (*)    All other components within normal limits  CBC WITH DIFFERENTIAL/PLATELET - Abnormal; Notable for the following components:   WBC 3.5 (*)    RBC 3.49 (*)    Hemoglobin 11.6 (*)    HCT 34.1 (*)    All other components within normal limits  RAPID URINE DRUG SCREEN, HOSP PERFORMED - Abnormal; Notable for the following components:   Benzodiazepines POSITIVE (*)    All other components within normal limits  SURGICAL PCR SCREEN  APTT  PROTIME-INR  URINALYSIS, ROUTINE W REFLEX MICROSCOPIC  ETHANOL  TYPE AND SCREEN  ABO/RH     IMAGES: Chest Xray 08/28/2019 FINDINGS: Lung volumes are normal. No consolidative airspace disease. No pleural effusions. No pneumothorax. No pulmonary nodule or mass noted. Large hiatal hernia. Pulmonary vasculature and the cardiomediastinal silhouette are otherwise within normal limits.  IMPRESSION: 1.  No radiographic evidence of acute cardiopulmonary disease. 2. Large hiatal hernia.  EKG: 08/28/2019 Rate 56 bpm Sinus bradycardia Possible left atrial enlargement Anterior infarct, age undetermined  Since last tracing rate slower  CV:  Past Medical History:  Diagnosis Date  . ADHD   . Anemia   . Anxiety   . Arthritis   . Depression   . Heart murmur    i went to see a cardiologit slast eyar  and i had zero plaque,   . PONV (postoperative nausea and  vomiting)   . Recovering alcoholic in remission [facility])     Past Surgical History:  Procedure Laterality Date  . CHOLECYSTECTOMY     1993  . COLONOSCOPY  10/25/2012   Procedure: COLONOSCOPY;  Surgeon: Laura Folk, MD;  [facility];  Service: Endoscopy;  Laterality: N/A;  . COLONOSCOPY  2019  . ESOPHAGOGASTRODUODENOSCOPY  10/24/2012   Procedure: ESOPHAGOGASTRODUODENOSCOPY (EGD);  Surgeon: [facility]., MD;  Location: [facility] ENDOSCOPY;  Service: Endoscopy;  Laterality: N/A;  . UPPER GI ENDOSCOPY  2019    MEDICATIONS: .  methocarbamol (ROBAXIN) 500 MG tablet  . amphetamine-dextroamphetamine (ADDERALL XR) 20 MG 24 hr capsule  . amphetamine-dextroamphetamine (ADDERALL XR) 20 MG 24 hr capsule  . buPROPion (WELLBUTRIN XL) 300 MG 24 hr tablet  . FLUoxetine (PROZAC) 20 MG capsule  . iron polysaccharides (NIFEREX) 150 MG capsule  . LORazepam (ATIVAN) 1 MG tablet  . pantoprazole (PROTONIX) 40 MG tablet  . traMADol (ULTRAM) 50 MG tablet  . traZODone (DESYREL) 50 MG tablet   No current facility-administered medications for this encounter.    Melene Muller ON 09/01/2019] bupivacaine liposome (EXPAREL) 1.3 % injection 133 mg  . [START ON 09/01/2019] tranexamic acid (CYKLOKAPRON) 2,000 mg in sodium chloride 0.9 % 50 mL Topical Application     Janey Genta [facility] Pre-Surgical Testing [phone] 08/31/19  1:33 PM

## 2019-08-31 NOTE — H&P (Signed)
TOTAL HIP ADMISSION H&P  Patient is admitted for right total hip arthroplasty.  Subjective:  Chief Complaint: right hip pain  HPI: [facility], 63 y.o. female, has a history of pain and functional disability in the right hip(s) due to arthritis and patient has failed non-surgical conservative treatments for greater than 12 weeks to include NSAID's and/or analgesics, corticosteriod injections, flexibility and strengthening excercises, use of assistive devices, weight reduction as appropriate and activity modification.  Onset of symptoms was gradual starting 5 years ago with gradually worsening course since that time.The patient noted no past surgery on the right hip(s).  Patient currently rates pain in the right hip at 10 out of 10 with activity. Patient has night pain, worsening of pain with activity and weight bearing, trendelenberg gait, pain that interfers with activities of daily living and crepitus. Patient has evidence of subchondral cysts, subchondral sclerosis, periarticular osteophytes and joint space narrowing by imaging studies. This condition presents safety issues increasing the risk of falls. There is no current active infection.  Patient Active Problem List   Diagnosis Date Noted  . Alcohol use disorder, moderate, dependence (HCC) 07/20/2015  . Alcohol intoxication (HCC)   . Iron deficiency anemia 10/23/2012  . Alcoholism (HCC) 10/23/2012  . Lower extremity edema 10/23/2012  . Tobacco abuse 10/23/2012  . ADD (attention deficit disorder) 10/23/2012  . Depression    Past Medical History:  Diagnosis Date  . ADHD   . Anemia   . Anxiety   . Arthritis   . Depression   . Heart murmur    i went to see a cardiologit slast eyar  and i had zero plaque,   . PONV (postoperative nausea and vomiting)   . Recovering alcoholic in remission [facility](HCC)     Past Surgical History:  Procedure Laterality Date  . CHOLECYSTECTOMY     1993  . COLONOSCOPY  10/25/2012   Procedure: COLONOSCOPY;   Surgeon: Barrie FolkJohn C Hayes, MD;  [facility];  Service: Endoscopy;  Laterality: N/A;  . COLONOSCOPY  2019  . ESOPHAGOGASTRODUODENOSCOPY  10/24/2012   Procedure: ESOPHAGOGASTRODUODENOSCOPY (EGD);  Surgeon: Vertell NovakJames L Edwards Jr., MD;  [facility];  Service: Endoscopy;  Laterality: N/A;  . UPPER GI ENDOSCOPY  2019    Current Facility-Administered Medications  Medication Dose Route Frequency Provider Last Rate Last Dose  . [START ON 09/01/2019] bupivacaine liposome (EXPAREL) 1.3 % injection 133 mg  10 mL Other Once Marcene Corningalldorf, Peter, MD      . Melene Muller[START ON 09/01/2019] tranexamic acid (CYKLOKAPRON) 2,000 mg in sodium chloride 0.9 % 50 mL Topical Application  2,000 mg Topical To OR Marcene Corningalldorf, Peter, MD       Current Outpatient Medications  Medication Sig Dispense Refill Last Dose  . buPROPion (WELLBUTRIN XL) 300 MG 24 hr tablet Take 300 mg by mouth daily.     . iron polysaccharides (NIFEREX) 150 MG capsule Take 150 mg by mouth daily.     . traMADol (ULTRAM) 50 MG tablet Take 50 mg by mouth every 6 (six) hours as needed for moderate pain.     . traZODone (DESYREL) 50 MG tablet Take 50 mg by mouth at bedtime as needed for sleep.     Marland Kitchen. amphetamine-dextroamphetamine (ADDERALL XR) 20 MG 24 hr capsule Take 1 capsule (20 mg total) by mouth 2 (two) times daily. (Patient not taking: Reported on 08/25/2019) 60 capsule 0 Not Taking at Unknown time  . amphetamine-dextroamphetamine (ADDERALL XR) 20 MG 24 hr capsule Take 1 capsule (20  mg total) by mouth 2 (two) times daily. (Patient not taking: Reported on 08/25/2019) 60 capsule 0 Not Taking at Unknown time  . FLUoxetine (PROZAC) 20 MG capsule TAKE 3 CAPSULES BY MOUTH EVERY DAY (Patient not taking: Reported on 08/25/2019) 90 capsule 0 Not Taking at Unknown time  . LORazepam (ATIVAN) 1 MG tablet Take 1 tablet (1 mg total) by mouth daily as needed for anxiety. (Patient not taking: Reported on 08/25/2019) 20 tablet 0 Not Taking at Unknown time  .  methocarbamol (ROBAXIN) 500 MG tablet Take 500 mg by mouth as needed for muscle spasms.     . pantoprazole (PROTONIX) 40 MG tablet Take 1 tablet (40 mg total) by mouth daily. (Patient not taking: Reported on 08/25/2019) 30 tablet 0 Not Taking at Unknown time   Allergies  Allergen Reactions  . Metronidazole Shortness Of Breath and Other (See Comments)    Heart pounding  . Ferrlecit [Na Ferric Gluc Cplx In Sucrose] Other (See Comments)    Infusion reaction 05/12/2019    Social History   Tobacco Use  . Smoking status: Former Smoker    Quit date: 08/16/2003    Years since quitting: 16.0  . Smokeless tobacco: Never Used  . Tobacco comment:  08-28-2019 "i smoked 2 cigarettes in the last month since my father  passed"  Substance Use Topics  . Alcohol use: Yes    Alcohol/week: 10.0 standard drinks    Types: 10 Glasses of wine per week    Family History  Problem Relation Age of Onset  . Atrial fibrillation Mother   . CAD Father      Review of Systems  Musculoskeletal: Positive for joint pain.       Right hip  All other systems reviewed and are negative.   Objective:  Physical Exam  Constitutional: She is oriented to person, place, and time. She appears well-developed and well-nourished.  HENT:  Head: Normocephalic and atraumatic.  Eyes: Pupils are equal, round, and reactive to light.  Neck: Normal range of motion.  Cardiovascular: Normal rate and regular rhythm.  Respiratory: Effort normal.  GI: Soft.  Musculoskeletal:     Comments: Right hip motion remains limited and very painful.  Straight leg raise is negative.  Sensation and motor function are intact in her feet with palpable pulses on both sides.  Opposite hip moves well.    Neurological: She is alert and oriented to person, place, and time.  Skin: Skin is warm and dry.  Psychiatric: She has a normal mood and affect. Her behavior is normal. Judgment and thought content normal.    Vital signs in last 24 hours:     Labs:   Estimated body mass index is 28.79 kg/m as calculated from the following:   Height as of 08/28/19: 5\' 5"  (1.651 m).   Weight as of 08/28/19: 78.5 kg.   Imaging Review Plain radiographs demonstrate severe degenerative joint disease of the right hip(s). The bone quality appears to be good for age and reported activity level.      Assessment/Plan:  End stage primary arthritis, right hip(s)  The patient history, physical examination, clinical judgement of the provider and imaging studies are consistent with end stage degenerative joint disease of the right hip(s) and total hip arthroplasty is deemed medically necessary. The treatment options including medical management, injection therapy, arthroscopy and arthroplasty were discussed at length. The risks and benefits of total hip arthroplasty were presented and reviewed. The risks due to aseptic loosening, infection, stiffness, dislocation/subluxation,  thromboembolic complications and other imponderables were discussed.  The patient acknowledged the explanation, agreed to proceed with the plan and consent was signed. Patient is being admitted for inpatient treatment for surgery, pain control, PT, OT, prophylactic antibiotics, VTE prophylaxis, progressive ambulation and ADL's and discharge planning.The patient is planning to be discharged home with home health services

## 2019-09-01 ENCOUNTER — Encounter (HOSPITAL_COMMUNITY)
Admission: RE | Disposition: A | Discharge status: Home / Self Care | Payer: Self-pay | Source: Home / Self Care | Attending: Orthopaedic Surgery

## 2019-09-01 ENCOUNTER — Other Ambulatory Visit: Payer: Self-pay

## 2019-09-01 ENCOUNTER — Encounter (HOSPITAL_COMMUNITY): Payer: Self-pay | Admitting: *Deleted

## 2019-09-01 ENCOUNTER — Inpatient Hospital Stay (HOSPITAL_COMMUNITY): Payer: 59

## 2019-09-01 ENCOUNTER — Observation Stay (HOSPITAL_COMMUNITY)
Admission: RE | Admit: 2019-09-01 | Discharge: 2019-09-02 | Disposition: A | Discharge status: Home / Self Care | Payer: 59 | Attending: Orthopaedic Surgery | Admitting: Orthopaedic Surgery

## 2019-09-01 ENCOUNTER — Inpatient Hospital Stay (HOSPITAL_COMMUNITY): Payer: 59 | Admitting: Certified Registered"

## 2019-09-01 ENCOUNTER — Inpatient Hospital Stay (HOSPITAL_COMMUNITY): Payer: 59 | Admitting: Physician Assistant

## 2019-09-01 DIAGNOSIS — F419 Anxiety disorder, unspecified: Secondary | ICD-10-CM | POA: Diagnosis not present

## 2019-09-01 DIAGNOSIS — F329 Major depressive disorder, single episode, unspecified: Secondary | ICD-10-CM | POA: Diagnosis not present

## 2019-09-01 DIAGNOSIS — F17210 Nicotine dependence, cigarettes, uncomplicated: Secondary | ICD-10-CM | POA: Diagnosis not present

## 2019-09-01 DIAGNOSIS — Z419 Encounter for procedure for purposes other than remedying health state, unspecified: Secondary | ICD-10-CM

## 2019-09-01 DIAGNOSIS — Z79899 Other long term (current) drug therapy: Secondary | ICD-10-CM | POA: Insufficient documentation

## 2019-09-01 DIAGNOSIS — M1611 Unilateral primary osteoarthritis, right hip: Secondary | ICD-10-CM | POA: Diagnosis not present

## 2019-09-01 DIAGNOSIS — F909 Attention-deficit hyperactivity disorder, unspecified type: Secondary | ICD-10-CM | POA: Diagnosis not present

## 2019-09-01 DIAGNOSIS — Z881 Allergy status to other antibiotic agents status: Secondary | ICD-10-CM | POA: Diagnosis not present

## 2019-09-01 HISTORY — PX: TOTAL HIP ARTHROPLASTY: SHX124

## 2019-09-01 LAB — TYPE AND SCREEN
ABO/RH(D): A POS
Antibody Screen: NEGATIVE

## 2019-09-01 SURGERY — ARTHROPLASTY, HIP, TOTAL, ANTERIOR APPROACH
Anesthesia: Spinal | Site: Hip | Laterality: Right

## 2019-09-01 MED ORDER — ACETAMINOPHEN 325 MG PO TABS: 325.0000 mg | ORAL_TABLET | Freq: Four times a day (QID) | ORAL | Status: DC | PRN

## 2019-09-01 MED ORDER — BISACODYL 5 MG PO TBEC: 5.0000 mg | DELAYED_RELEASE_TABLET | Freq: Every day | ORAL | Status: DC | PRN

## 2019-09-01 MED ORDER — PROMETHAZINE HCL 25 MG/ML IJ SOLN: 6.2500 mg | INTRAMUSCULAR | Status: DC | PRN

## 2019-09-01 MED ORDER — BUPIVACAINE LIPOSOME 1.3 % IJ SUSP
INTRAMUSCULAR | Status: DC | PRN
  Administered 2019-09-01: 10 mL

## 2019-09-01 MED ORDER — TRANEXAMIC ACID-NACL 1000-0.7 MG/100ML-% IV SOLN
1000.0000 mg | Freq: Once | INTRAVENOUS | Status: AC
  Administered 2019-09-01: 1000 mg via INTRAVENOUS
  Filled 2019-09-01: qty 100

## 2019-09-01 MED ORDER — 0.9 % SODIUM CHLORIDE (POUR BTL) OPTIME
TOPICAL | Status: DC | PRN
  Administered 2019-09-01: 1000 mL

## 2019-09-01 MED ORDER — MIDAZOLAM HCL 2 MG/2ML IJ SOLN
INTRAMUSCULAR | Status: AC
  Filled 2019-09-01: qty 2

## 2019-09-01 MED ORDER — DIPHENHYDRAMINE HCL 12.5 MG/5ML PO ELIX: 12.5000 mg | ORAL_SOLUTION | ORAL | Status: DC | PRN

## 2019-09-01 MED ORDER — MORPHINE SULFATE (PF) 2 MG/ML IV SOLN: 0.5000 mg | INTRAVENOUS | Status: DC | PRN

## 2019-09-01 MED ORDER — LACTATED RINGERS IV SOLN
INTRAVENOUS | Status: DC
  Administered 2019-09-01: 15:00:00 via INTRAVENOUS

## 2019-09-01 MED ORDER — ALUM & MAG HYDROXIDE-SIMETH 200-200-20 MG/5ML PO SUSP: 30.0000 mL | ORAL | Status: DC | PRN

## 2019-09-01 MED ORDER — PHENYLEPHRINE 40 MCG/ML (10ML) SYRINGE FOR IV PUSH (FOR BLOOD PRESSURE SUPPORT)
PREFILLED_SYRINGE | INTRAVENOUS | Status: AC
  Filled 2019-09-01: qty 10

## 2019-09-01 MED ORDER — KETOROLAC TROMETHAMINE 15 MG/ML IJ SOLN
15.0000 mg | Freq: Four times a day (QID) | INTRAMUSCULAR | Status: AC
  Administered 2019-09-01 – 2019-09-02 (×4): 15 mg via INTRAVENOUS
  Filled 2019-09-01 (×4): qty 1

## 2019-09-01 MED ORDER — SODIUM CHLORIDE 0.9 % IV SOLN
INTRAVENOUS | Status: DC | PRN
  Administered 2019-09-01: 25 ug/min via INTRAVENOUS

## 2019-09-01 MED ORDER — POVIDONE-IODINE 10 % EX SWAB
2.0000 "application " | Freq: Once | CUTANEOUS | Status: AC
  Administered 2019-09-01: 2 via TOPICAL

## 2019-09-01 MED ORDER — PHENYLEPHRINE 40 MCG/ML (10ML) SYRINGE FOR IV PUSH (FOR BLOOD PRESSURE SUPPORT)
PREFILLED_SYRINGE | INTRAVENOUS | Status: DC | PRN
  Administered 2019-09-01: 120 ug via INTRAVENOUS

## 2019-09-01 MED ORDER — ASPIRIN 81 MG PO CHEW
81.0000 mg | CHEWABLE_TABLET | Freq: Two times a day (BID) | ORAL | Status: DC
  Administered 2019-09-02: 81 mg via ORAL
  Filled 2019-09-01: qty 1

## 2019-09-01 MED ORDER — ONDANSETRON HCL 4 MG/2ML IJ SOLN: 4.0000 mg | Freq: Four times a day (QID) | INTRAMUSCULAR | Status: DC | PRN

## 2019-09-01 MED ORDER — HYDROMORPHONE HCL 1 MG/ML IJ SOLN
0.2500 mg | INTRAMUSCULAR | Status: DC | PRN
  Administered 2019-09-01: 0.5 mg via INTRAVENOUS

## 2019-09-01 MED ORDER — DEXAMETHASONE SODIUM PHOSPHATE 10 MG/ML IJ SOLN
INTRAMUSCULAR | Status: AC
  Filled 2019-09-01: qty 1

## 2019-09-01 MED ORDER — ONDANSETRON HCL 4 MG/2ML IJ SOLN
INTRAMUSCULAR | Status: DC | PRN
  Administered 2019-09-01: 4 mg via INTRAVENOUS

## 2019-09-01 MED ORDER — OXYCODONE HCL 5 MG PO TABS: 5.0000 mg | ORAL_TABLET | Freq: Once | ORAL | Status: DC | PRN

## 2019-09-01 MED ORDER — MENTHOL 3 MG MT LOZG: 1.0000 | LOZENGE | OROMUCOSAL | Status: DC | PRN

## 2019-09-01 MED ORDER — BUPROPION HCL ER (XL) 300 MG PO TB24
300.0000 mg | ORAL_TABLET | Freq: Every day | ORAL | Status: DC
  Administered 2019-09-02: 300 mg via ORAL
  Filled 2019-09-01: qty 1

## 2019-09-01 MED ORDER — OXYCODONE HCL 5 MG/5ML PO SOLN: 5.0000 mg | Freq: Once | ORAL | Status: DC | PRN

## 2019-09-01 MED ORDER — METOCLOPRAMIDE HCL 5 MG PO TABS: 5.0000 mg | ORAL_TABLET | Freq: Three times a day (TID) | ORAL | Status: DC | PRN

## 2019-09-01 MED ORDER — PHENYLEPHRINE HCL (PRESSORS) 10 MG/ML IV SOLN
INTRAVENOUS | Status: AC
  Filled 2019-09-01: qty 1

## 2019-09-01 MED ORDER — PROPOFOL 500 MG/50ML IV EMUL
INTRAVENOUS | Status: AC
  Filled 2019-09-01: qty 50

## 2019-09-01 MED ORDER — PHENOL 1.4 % MT LIQD: 1.0000 | OROMUCOSAL | Status: DC | PRN

## 2019-09-01 MED ORDER — TRANEXAMIC ACID-NACL 1000-0.7 MG/100ML-% IV SOLN
1000.0000 mg | INTRAVENOUS | Status: AC
  Administered 2019-09-01: 1000 mg via INTRAVENOUS
  Filled 2019-09-01: qty 100

## 2019-09-01 MED ORDER — HYDROCODONE-ACETAMINOPHEN 5-325 MG PO TABS
1.0000 | ORAL_TABLET | ORAL | Status: DC | PRN
  Administered 2019-09-01: 2 via ORAL
  Administered 2019-09-01: 1 via ORAL
  Administered 2019-09-02: 2 via ORAL
  Filled 2019-09-01 (×2): qty 2
  Filled 2019-09-01: qty 1

## 2019-09-01 MED ORDER — HYDROCODONE-ACETAMINOPHEN 7.5-325 MG PO TABS: 1.0000 | ORAL_TABLET | ORAL | Status: DC | PRN

## 2019-09-01 MED ORDER — ONDANSETRON HCL 4 MG PO TABS: 4.0000 mg | ORAL_TABLET | Freq: Four times a day (QID) | ORAL | Status: DC | PRN

## 2019-09-01 MED ORDER — CHLORHEXIDINE GLUCONATE 4 % EX LIQD: 60.0000 mL | Freq: Once | CUTANEOUS | Status: DC

## 2019-09-01 MED ORDER — BUPIVACAINE IN DEXTROSE 0.75-8.25 % IT SOLN
INTRATHECAL | Status: DC | PRN
  Administered 2019-09-01: 1.8 mL via INTRATHECAL

## 2019-09-01 MED ORDER — LACTATED RINGERS IV SOLN
INTRAVENOUS | Status: DC
  Administered 2019-09-01 (×2): via INTRAVENOUS

## 2019-09-01 MED ORDER — METOCLOPRAMIDE HCL 5 MG/ML IJ SOLN: 5.0000 mg | Freq: Three times a day (TID) | INTRAMUSCULAR | Status: DC | PRN

## 2019-09-01 MED ORDER — TRAZODONE HCL 50 MG PO TABS
50.0000 mg | ORAL_TABLET | Freq: Every evening | ORAL | Status: DC | PRN
  Administered 2019-09-02: 50 mg via ORAL
  Filled 2019-09-01: qty 1

## 2019-09-01 MED ORDER — HYDROMORPHONE HCL 1 MG/ML IJ SOLN
INTRAMUSCULAR | Status: AC
  Filled 2019-09-01: qty 1

## 2019-09-01 MED ORDER — DEXAMETHASONE SODIUM PHOSPHATE 10 MG/ML IJ SOLN
INTRAMUSCULAR | Status: DC | PRN
  Administered 2019-09-01: 8 mg via INTRAVENOUS

## 2019-09-01 MED ORDER — TRANEXAMIC ACID 1000 MG/10ML IV SOLN
INTRAVENOUS | Status: DC | PRN
  Administered 2019-09-01: 2000 mg via TOPICAL

## 2019-09-01 MED ORDER — METHOCARBAMOL 500 MG IVPB - SIMPLE MED
500.0000 mg | Freq: Four times a day (QID) | INTRAVENOUS | Status: DC | PRN
  Filled 2019-09-01: qty 50

## 2019-09-01 MED ORDER — DOCUSATE SODIUM 100 MG PO CAPS
100.0000 mg | ORAL_CAPSULE | Freq: Two times a day (BID) | ORAL | Status: DC
  Administered 2019-09-01 – 2019-09-02 (×2): 100 mg via ORAL
  Filled 2019-09-01 (×2): qty 1

## 2019-09-01 MED ORDER — ACETAMINOPHEN 500 MG PO TABS
500.0000 mg | ORAL_TABLET | Freq: Four times a day (QID) | ORAL | Status: AC
  Administered 2019-09-01 – 2019-09-02 (×4): 500 mg via ORAL
  Filled 2019-09-01 (×4): qty 1

## 2019-09-01 MED ORDER — ONDANSETRON HCL 4 MG/2ML IJ SOLN
INTRAMUSCULAR | Status: AC
  Filled 2019-09-01: qty 2

## 2019-09-01 MED ORDER — MIDAZOLAM HCL 2 MG/2ML IJ SOLN
INTRAMUSCULAR | Status: DC | PRN
  Administered 2019-09-01: 2 mg via INTRAVENOUS

## 2019-09-01 MED ORDER — POLYSACCHARIDE IRON COMPLEX 150 MG PO CAPS
150.0000 mg | ORAL_CAPSULE | Freq: Every day | ORAL | Status: DC
  Administered 2019-09-01 – 2019-09-02 (×2): 150 mg via ORAL
  Filled 2019-09-01 (×2): qty 1

## 2019-09-01 MED ORDER — BUPIVACAINE-EPINEPHRINE (PF) 0.25% -1:200000 IJ SOLN
INTRAMUSCULAR | Status: DC | PRN
  Administered 2019-09-01: 30 mL

## 2019-09-01 MED ORDER — CEFAZOLIN SODIUM-DEXTROSE 2-4 GM/100ML-% IV SOLN
2.0000 g | INTRAVENOUS | Status: AC
  Administered 2019-09-01: 2 g via INTRAVENOUS
  Filled 2019-09-01: qty 100

## 2019-09-01 MED ORDER — PROPOFOL 500 MG/50ML IV EMUL
INTRAVENOUS | Status: DC | PRN
  Administered 2019-09-01: 135 ug/kg/min via INTRAVENOUS

## 2019-09-01 MED ORDER — METHOCARBAMOL 500 MG PO TABS
500.0000 mg | ORAL_TABLET | Freq: Four times a day (QID) | ORAL | Status: DC | PRN
  Administered 2019-09-01 – 2019-09-02 (×2): 500 mg via ORAL
  Filled 2019-09-01 (×2): qty 1

## 2019-09-01 MED ORDER — CEFAZOLIN SODIUM-DEXTROSE 2-4 GM/100ML-% IV SOLN
2.0000 g | Freq: Four times a day (QID) | INTRAVENOUS | Status: AC
  Administered 2019-09-01 (×2): 2 g via INTRAVENOUS
  Filled 2019-09-01 (×2): qty 100

## 2019-09-01 MED ORDER — PROPOFOL 10 MG/ML IV BOLUS
INTRAVENOUS | Status: DC | PRN
  Administered 2019-09-01 (×3): 20 mg via INTRAVENOUS

## 2019-09-01 SURGICAL SUPPLY — 43 items
BAG DECANTER FOR FLEXI CONT (MISCELLANEOUS) ×2 IMPLANT
BLADE SAW SGTL 18X1.27X75 (BLADE) ×2 IMPLANT
BOOTIES KNEE HIGH SLOAN (MISCELLANEOUS) ×2 IMPLANT
CELLS DAT CNTRL 66122 CELL SVR (MISCELLANEOUS) ×1 IMPLANT
COVER PERINEAL POST (MISCELLANEOUS) ×2 IMPLANT
COVER SURGICAL LIGHT HANDLE (MISCELLANEOUS) ×2 IMPLANT
COVER WAND RF STERILE (DRAPES) IMPLANT
CUP GRIPTON 48MM 100 HIP (Hips) ×1 IMPLANT
DECANTER SPIKE VIAL GLASS SM (MISCELLANEOUS) ×2 IMPLANT
DRAPE IMP U-DRAPE 54X76 (DRAPES) ×2 IMPLANT
DRAPE STERI IOBAN 125X83 (DRAPES) ×2 IMPLANT
DRAPE U-SHAPE 47X51 STRL (DRAPES) ×4 IMPLANT
DRSG AQUACEL AG ADV 3.5X 6 (GAUZE/BANDAGES/DRESSINGS) ×2 IMPLANT
DURAPREP 26ML APPLICATOR (WOUND CARE) ×2 IMPLANT
ELECT BLADE TIP CTD 4 INCH (ELECTRODE) ×2 IMPLANT
ELECT REM PT RETURN 15FT ADLT (MISCELLANEOUS) ×2 IMPLANT
ELIMINATOR HOLE APEX DEPUY (Hips) ×1 IMPLANT
GLOVE BIO SURGEON STRL SZ8 (GLOVE) ×4 IMPLANT
GLOVE BIOGEL PI IND STRL 8 (GLOVE) ×2 IMPLANT
GLOVE BIOGEL PI INDICATOR 8 (GLOVE) ×2
GOWN STRL REUS W/TWL XL LVL3 (GOWN DISPOSABLE) ×4 IMPLANT
HEAD FEMORAL 32 CERAMIC (Hips) ×1 IMPLANT
HOLDER FOLEY CATH W/STRAP (MISCELLANEOUS) ×2 IMPLANT
KIT TURNOVER KIT A (KITS) IMPLANT
LINER ACET 32X48 (Liner) ×1 IMPLANT
MANIFOLD NEPTUNE II (INSTRUMENTS) ×2 IMPLANT
NEEDLE HYPO 22GX1.5 SAFETY (NEEDLE) ×2 IMPLANT
PACK ANTERIOR HIP CUSTOM (KITS) ×2 IMPLANT
PROTECTOR NERVE ULNAR (MISCELLANEOUS) ×2 IMPLANT
RETRACTOR WND ALEXIS 18 MED (MISCELLANEOUS) ×1 IMPLANT
RTRCTR WOUND ALEXIS 18CM MED (MISCELLANEOUS) ×2
STEM FEM SZ3 STD ACTIS (Stem) ×1 IMPLANT
SUT ETHIBOND NAB CT1 #1 30IN (SUTURE) ×4 IMPLANT
SUT VIC AB 1 CT1 36 (SUTURE) ×2 IMPLANT
SUT VIC AB 2-0 CT1 27 (SUTURE) ×2
SUT VIC AB 2-0 CT1 TAPERPNT 27 (SUTURE) ×1 IMPLANT
SUT VIC AB 3-0 PS2 18 (SUTURE) ×2
SUT VIC AB 3-0 PS2 18XBRD (SUTURE) ×1 IMPLANT
SUT VLOC 180 0 24IN GS25 (SUTURE) ×2 IMPLANT
SYR 50ML LL SCALE MARK (SYRINGE) ×2 IMPLANT
TRAY FOLEY MTR SLVR 14FR STAT (SET/KITS/TRAYS/PACK) ×1 IMPLANT
WATER STERILE IRR 1000ML POUR (IV SOLUTION) ×2 IMPLANT
YANKAUER SUCT BULB TIP 10FT TU (MISCELLANEOUS) ×2 IMPLANT

## 2019-09-01 NOTE — Transfer of Care (Signed)
Immediate Anesthesia Transfer of Care Note  Patient: [facility]  Procedure(s) Performed: RIGHT TOTAL HIP ARTHROPLASTY ANTERIOR APPROACH (Right Hip)  Patient Location: PACU  Anesthesia Type:Spinal  Level of Consciousness: drowsy  Airway & Oxygen Therapy: Patient Spontanous Breathing and Patient connected to face mask oxygen  Post-op Assessment: Report given to RN and Post -op Vital signs reviewed and stable  Post vital signs: Reviewed and stable  Last Vitals:  Vitals Value Taken Time  BP 94/56 09/01/19 1131  Temp    Pulse 59 09/01/19 1133  Resp 12 09/01/19 1133  SpO2 100 % 09/01/19 1133  Vitals shown include unvalidated device data.  Last Pain:  Vitals:   09/01/19 0822  TempSrc: Oral  PainSc:       Patients Stated Pain Goal: 4 (29/24/46 2863)  Complications: No apparent anesthesia complications

## 2019-09-01 NOTE — Interval H&P Note (Signed)
History and Physical Interval Note:  09/01/2019 9:10 AM  [facility]  has presented today for surgery, with the diagnosis of Right Hip Degenertive Joint Disease.  The various methods of treatment have been discussed with the patient and family. After consideration of risks, benefits and other options for treatment, the patient has consented to  Procedure(s): RIGHT TOTAL HIP ARTHROPLASTY ANTERIOR APPROACH (Right) as a surgical intervention.  The patient's history has been reviewed, patient examined, no change in status, stable for surgery.  I have reviewed the patient's chart and labs.  Questions were answered to the patient's satisfaction.     Hessie Dibble

## 2019-09-01 NOTE — Plan of Care (Signed)
Plan of care 

## 2019-09-01 NOTE — Op Note (Signed)
PRE-OP DIAGNOSIS:  RIGHT HIP DEGENERATIVE JOINT DISEASE POST-OP DIAGNOSIS:  same PROCEDURE: RIGHT TOTAL HIP ARTHROPLASTY ANTERIOR APPROACH ANESTHESIA:  Spinal and MAC SURGEON:  Melrose Nakayama MD ASSISTANT:  Loni Dolly PA-C   INDICATIONS FOR PROCEDURE:  The patient is a 63 y.o. female with a long history of a painful hip.  This has persisted despite multiple conservative measures.  The patient has persisted with pain and dysfunction making rest and activity difficult.  A total hip replacement is offered as surgical treatment.  Informed operative consent was obtained after discussion of possible complications including reaction to anesthesia, infection, neurovascular injury, dislocation, DVT, PE, and death.  The importance of the postoperative rehab program to optimize result was stressed with the patient.  SUMMARY OF FINDINGS AND PROCEDURE:  Under the above anesthesia through a anterior approach an the Hana table a right THR was performed.  The patient had severe degenerative change and good bone quality.  We used DePuy components to replace the hip and these were size 3 standard Actis femur capped with a +1 15m ceramic hip ball.  On the acetabular side we used a size 48 Gription shell with a  plus 0 neutral polyethylene liner.  We did use a hole eliminator.  ALoni DollyPA-C assisted throughout and was invaluable to the completion of the case in that he helped position and retract while I performed the procedure.  He also closed simultaneously to help minimize OR time.  I used fluoroscopy throughout the case to check position of implants and leg lengths and read all of these views myself.  DESCRIPTION OF PROCEDURE:  The patient was taken to the OR suite where the above anesthetic was applied.  The patient was then positioned on the Hana table supine.  All bony prominences were appropriately padded.  Prep and drape was then performed in normal sterile fashion.  The patient was given kefzol preoperative  antibiotic and an appropriate time out was performed.  We then took an anterior approach to the right hip.  Dissection was taken through adipose to the tensor fascia lata fascia.  This structure was incised longitudinally and we dissected in the intermuscular interval just medial to this muscle.  Cobra retractors were placed superior and inferior to the femoral neck superficial to the capsule.  A capsular incision was then made and the retractors were placed along the femoral neck.  Xray was brought in to get a good level for the femoral neck cut which was made with an oscillating saw and osteotome.  The femoral head was removed with a corkscrew.  The acetabulum was exposed and some labral tissues were excised. Reaming was taken to the inside wall of the pelvis and sequentially up to 1 mm smaller than the actual component.  A trial of components was done and then the aforementioned acetabular shell was placed in appropriate tilt and anteversion confirmed by fluoroscopy. The liner was placed along with the hole eliminator and attention was turned to the femur.  The leg was brought down and over into adduction and the elevator bar was used to raise the femur up gently in the wound.  The piriformis was released with care taken to preserve the obturator internus attachment and all of the posterior capsule. The femur was reamed and then broached to the appropriate size.  A trial reduction was done and the aforementioned head and neck assembly gave uKoreathe best stability in extension with external rotation.  Leg lengths were felt to be about  equal by fluoroscopic exam.  The trial components were removed and the wound irrigated.  We then placed the femoral component in appropriate anteversion.  The head was applied to a dry stem neck and the hip again reduced.  It was again stable in the aforementioned position.  The would was irrigated again followed by re-approximation of anterior capsule with ethibond suture. Tensor  fascia was repaired with V-loc suture  followed by deep closure with #O and #2 undyed vicryl.  Skin was closed with subQ stitch and steristrips followed by a sterile dressing.  EBL and IOF can be obtained from anesthesia records.  DISPOSITION:  The patient was extubated in the OR and taken to PACU in stable condition to be admitted to the Orthopedic Surgery for appropriate post-op care to include perioperative antibiotics and DVT prophylaxis.

## 2019-09-01 NOTE — Anesthesia Procedure Notes (Signed)
Procedure Name: MAC Date/Time: 09/01/2019 9:55 AM Performed by: Niel Hummer, CRNA Pre-anesthesia Checklist: Patient identified, Emergency Drugs available, Suction available and Patient being monitored Patient Re-evaluated:Patient Re-evaluated prior to induction Oxygen Delivery Method: Simple face mask

## 2019-09-01 NOTE — Anesthesia Postprocedure Evaluation (Signed)
Anesthesia Post Note  Patient: [facility]  Procedure(s) Performed: RIGHT TOTAL HIP ARTHROPLASTY ANTERIOR APPROACH (Right Hip)     Patient location during evaluation: PACU Anesthesia Type: Spinal Level of consciousness: oriented and awake and alert Pain management: pain level controlled Vital Signs Assessment: post-procedure vital signs reviewed and stable Respiratory status: spontaneous breathing and respiratory function stable Cardiovascular status: blood pressure returned to baseline and stable Postop Assessment: no headache, no backache and no apparent nausea or vomiting Anesthetic complications: no    Last Vitals:  Vitals:   09/01/19 1245 09/01/19 1318  BP: 122/81 131/87  Pulse: 64 65  Resp: 10 16  Temp: 36.9 C 36.8 C  SpO2: 100% 100%    Last Pain:  Vitals:   09/01/19 1318  TempSrc: Oral  PainSc:                  Lynda Rainwater

## 2019-09-01 NOTE — Evaluation (Signed)
Physical Therapy Evaluation Patient Details Name: Laura Chang MRN: [id number] DOB: [birth year] Today's Date: 09/01/2019   History of Present Illness  Patient is 62 y.o. female s/p Rt THA anterior approach on 09/01/19 with PMH significant for OA, depression, and anxiety.    Clinical Impression  Laura Chang is a 64 y.o. female POD 0 s/p Rt THA anterior approach. Patient reports independence with mobility at baseline. Patient is now limited by functional impairments (see PT problem list below) and requires min assist/guard for transfers and gait with RW. Patient was able to ambulate ~100 feet with RW and initial cues for safe use. Patient instructed in exercise to facilitate ROM and circulation to manage edema. Patient will benefit from continued skilled PT interventions to address impairments and progress towards PLOF. Acute PT will follow to progress mobility and stair training in preparation for safe discharge home.     Follow Up Recommendations Follow surgeon's recommendation for DC plan and follow-up therapies    Equipment Recommendations  Rolling walker with 5" wheels    Recommendations for Other Services       Precautions / Restrictions Precautions Precautions: Fall Restrictions Weight Bearing Restrictions: No      Mobility  Bed Mobility Overal bed mobility: Needs Assistance Bed Mobility: Supine to Sit     Supine to sit: Min assist;HOB elevated     General bed mobility comments: cues for sequencing and use of bed rail  Transfers Overall transfer level: Needs assistance Equipment used: Rolling walker (2 wheeled) Transfers: Sit to/from Stand Sit to Stand: Min assist         General transfer comment: cues for hand placement and technique with RW, no assist needed to initiate power up however requried to steady with rise  Ambulation/Gait Ambulation/Gait assistance: Min guard Gait Distance (Feet): 100 Feet Assistive device: Rolling walker (2 wheeled) Gait  Pattern/deviations: Step-through pattern;Decreased stride length;Decreased stance time - right Gait velocity: decreased   General Gait Details: cues at start for safe hand placement and proximity to RW and pt maintained throughout, no overt LOB noted  Stairs            Wheelchair Mobility    Modified Rankin (Stroke Patients Only)       Balance Overall balance assessment: Needs assistance Sitting-balance support: No upper extremity supported;Feet supported Sitting balance-Leahy Scale: Good     Standing balance support: During functional activity;Bilateral upper extremity supported Standing balance-Leahy Scale: Fair          Pertinent Vitals/Pain Pain Assessment: 0-10 Pain Score: 4  Pain Location: Rt Hip Pain Descriptors / Indicators: Aching;Tightness Pain Intervention(s): Limited activity within patient's tolerance;Monitored during session;Repositioned;Ice applied    Home Living Family/patient expects to be discharged to:: Private residence Living Arrangements: Spouse/significant other(children live close by) Available Help at Discharge: Family Type of Home: House Home Access: Stairs to enter Entrance Stairs-Rails: None Entrance Stairs-Number of Steps: pt lives in [place] house in [place] (front stairs: 6 steps, landing, 4 steps, landing, doorway threshold) pt has center rail throughout front steps; 1 threshold at back door with no rail Home Layout: Two level;Bed/bath upstairs;Able to live on main level with bedroom/bathroom Home Equipment: Shower seat;Bedside commode;Walker - 4 wheels;Cane - single point      Prior Function Level of Independence: Independent;Independent with assistive device(s)         Comments: pt used cane occasionally     Hand Dominance   Dominant Hand: Right    Extremity/Trunk Assessment   Upper Extremity  Assessment Upper Extremity Assessment: Overall WFL for tasks assessed    Lower Extremity Assessment Lower Extremity  Assessment: Overall WFL for tasks assessed;RLE deficits/detail RLE Deficits / Details: pt with good quad activation in supine and 4/5 fr quad with LAQ resisted testing RLE Sensation: WNL RLE Coordination: WNL    Cervical / Trunk Assessment Cervical / Trunk Assessment: Normal  Communication   Communication: No difficulties  Cognition Arousal/Alertness: Awake/alert Behavior During Therapy: WFL for tasks assessed/performed Overall Cognitive Status: Within Functional Limits for tasks assessed             General Comments      Exercises Total Joint Exercises Ankle Circles/Pumps: AROM;Seated;10 reps;Both Quad Sets: AROM;10 reps;Supine;Right Heel Slides: AROM;10 reps;Supine;Right   Assessment/Plan    PT Assessment Patient needs continued PT services  PT Problem List Decreased strength;Decreased balance;Decreased mobility;Decreased range of motion;Decreased activity tolerance;Decreased coordination;Decreased knowledge of use of DME       PT Treatment Interventions DME instruction;Functional mobility training;Balance training;Patient/family education;Modalities;Gait training;Therapeutic activities;Therapeutic exercise;Stair training    PT Goals (Current goals can be found in the Care Plan section)  Acute Rehab PT Goals 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 PT Goal Formulation: With patient Time For Goal Achievement: 09/08/19 Potential to Achieve Goals: Good    Frequency 7X/week    AM-PAC PT "6 Clicks" Mobility  Outcome Measure Help needed turning from your back to your side while in a flat bed without using bedrails?: A Little Help needed moving from lying on your back to sitting on the side of a flat bed without using bedrails?: A Little Help needed moving to and from a bed to a chair (including a wheelchair)?: A Little Help needed standing up from a chair using your arms (e.g., wheelchair or bedside chair)?: A Little Help needed to walk in hospital  room?: A Little Help needed climbing 3-5 steps with a railing? : A Little 6 Click Score: 18    End of Session Equipment Utilized During Treatment: Gait belt Activity Tolerance: Patient tolerated treatment well Patient left: in chair;with call bell/phone within reach;with chair alarm set Nurse Communication: Mobility status PT Visit Diagnosis: Muscle weakness (generalized) (M62.81);Difficulty in walking, not elsewhere classified (R26.2)    Time: 2876-8115 PT Time Calculation (min) (ACUTE ONLY): 28 min   Charges:   PT Evaluation $PT Eval Low Complexity: 1 Low PT Treatments $Therapeutic Exercise: 8-22 mins        Kipp Brood, PT, DPT Physical Therapist with [facility]  09/01/2019 5:15 PM

## 2019-09-01 NOTE — Anesthesia Procedure Notes (Signed)
Spinal  Patient location during procedure: OR Start time: 09/01/2019 9:56 AM End time: 09/01/2019 10:00 AM Staffing Resident/CRNA: Niel Hummer, CRNA Performed: resident/CRNA  Preanesthetic Checklist Completed: patient identified, surgical consent, pre-op evaluation, IV checked, risks and benefits discussed and monitors and equipment checked Spinal Block Patient position: sitting Prep: DuraPrep Patient monitoring: heart rate, continuous pulse ox and blood pressure Approach: midline Location: L3-4 Injection technique: single-shot Needle Needle type: Pencan  Needle gauge: 24 G

## 2019-09-02 ENCOUNTER — Encounter (HOSPITAL_COMMUNITY): Payer: Self-pay | Admitting: Orthopaedic Surgery

## 2019-09-02 DIAGNOSIS — M1611 Unilateral primary osteoarthritis, right hip: Secondary | ICD-10-CM | POA: Diagnosis not present

## 2019-09-02 LAB — BASIC METABOLIC PANEL
Anion gap: 9 (ref 5–15)
BUN: 15 mg/dL (ref 8–23)
CO2: 20 mmol/L — ABNORMAL LOW (ref 22–32)
Calcium: 8.2 mg/dL — ABNORMAL LOW (ref 8.9–10.3)
Chloride: 105 mmol/L (ref 98–111)
Creatinine, Ser: 0.76 mg/dL (ref 0.44–1.00)
GFR calc Af Amer: >60 mL/min (ref >60)
GFR calc non Af Amer: >60 mL/min (ref >60)
Glucose, Bld: 129 mg/dL — ABNORMAL HIGH (ref 70–99)
Potassium: 3.7 mmol/L (ref 3.5–5.1)
Sodium: 134 mmol/L — ABNORMAL LOW (ref 135–145)

## 2019-09-02 LAB — CBC
HCT: 28.8 % — ABNORMAL LOW (ref 36.0–46.0)
Hemoglobin: 9.6 g/dL — ABNORMAL LOW (ref 12.0–15.0)
MCH: 32.1 pg (ref 26.0–34.0)
MCHC: 33.3 g/dL (ref 30.0–36.0)
MCV: 96.3 fL (ref 80.0–100.0)
Platelets: 212 10*3/uL (ref 150–400)
RBC: 2.99 MIL/uL — ABNORMAL LOW (ref 3.87–5.11)
RDW: 12.6 % (ref 11.5–15.5)
WBC: 8.5 10*3/uL (ref 4.0–10.5)
nRBC: 0 % (ref 0.0–0.2)

## 2019-09-02 MED ORDER — TIZANIDINE HCL 4 MG PO TABS: 4.0000 mg | ORAL_TABLET | Freq: Four times a day (QID) | ORAL | 1 refills | Status: DC | PRN

## 2019-09-02 MED ORDER — ASPIRIN 81 MG PO CHEW: 81.0000 mg | CHEWABLE_TABLET | Freq: Two times a day (BID) | ORAL | 0 refills | Status: DC

## 2019-09-02 MED ORDER — TRAMADOL HCL 50 MG PO TABS: 50.0000 mg | ORAL_TABLET | Freq: Four times a day (QID) | ORAL | 0 refills | Status: DC | PRN

## 2019-09-02 NOTE — Progress Notes (Signed)
Subjective: 1 Day Post-Op Procedure(s) (LRB): RIGHT TOTAL HIP ARTHROPLASTY ANTERIOR APPROACH (Right)   Patient feels great and is looking forward to going home.  Activity level:  wbat Diet tolerance:  ok Voiding:  ok Patient reports pain as mild.    Objective: Vital signs in last 24 hours: Temp:  [97.6 F (36.4 C)-99.5 F (37.5 C)] 98.2 F (36.8 C) (10/21 0603) Pulse Rate:  [59-109] 79 (10/21 0603) Resp:  [10-20] 18 (10/21 0603) BP: (94-155)/(56-107) 109/79 (10/21 0603) SpO2:  [95 %-100 %] 99 % (10/21 0603) Weight:  [78.5 kg] 78.5 kg (10/20 1318)  Labs: Recent Labs    09/02/19 0249  HGB 9.6*   Recent Labs    09/02/19 0249  WBC 8.5  RBC 2.99*  HCT 28.8*  PLT 212   Recent Labs    09/02/19 0249  NA 134*  K 3.7  CL 105  CO2 20*  BUN 15  CREATININE 0.76  GLUCOSE 129*  CALCIUM 8.2*   No results for input(s): LABPT, INR in the last 72 hours.  Physical Exam:  Neurologically intact ABD soft Neurovascular intact Sensation intact distally Intact pulses distally Dorsiflexion/Plantar flexion intact Incision: dressing C/D/I and no drainage No cellulitis present Compartment soft  Assessment/Plan:  1 Day Post-Op Procedure(s) (LRB): RIGHT TOTAL HIP ARTHROPLASTY ANTERIOR APPROACH (Right) Advance diet Up with therapy D/C IV fluids Discharge home with home health today after PT. Continue on ASA 81mg  BID x 4 weeks  Follow up in office 2 weeks post op.    Larwance Sachs Vonita Calloway 09/02/2019, 8:06 AM

## 2019-09-02 NOTE — Progress Notes (Signed)
Physical Therapy Treatment Patient Details Name: Laura Chang MRN: [id number] DOB: 06-14-1956 Today's Date: 09/02/2019    History of Present Illness Patient is 63 y.o. female s/p Rt THA anterior approach on 09/01/19 with PMH significant for OA, depression, and anxiety.    PT Comments    Pt able to negotiate stairs with side stepping and rail.  She will have A at home with stair management.  Issued pictorial handout.  Recommend RW and 3-1 BSC.  From a PT standpoint she is safe to go home with family support.  Follow Up Recommendations  Follow surgeon's recommendation for DC plan and follow-up therapies     Equipment Recommendations  Rolling walker with 5" wheels;3in1 (PT)    Recommendations for Other Services       Precautions / Restrictions Precautions Precautions: Fall Restrictions Weight Bearing Restrictions: No    Mobility  Bed Mobility Overal bed mobility: Needs Assistance Bed Mobility: Supine to Sit     Supine to sit: Min guard     General bed mobility comments: Pt able to perform with MIN/guard and used UE to move R LE  Transfers Overall transfer level: Needs assistance Equipment used: Rolling walker (2 wheeled) Transfers: Sit to/from Stand Sit to Stand: Min guard         General transfer comment: cues for hand placement and to also make sure she keeps RW with her with turning to sitting surface.   Ambulation/Gait Ambulation/Gait assistance: Min guard Gait Distance (Feet): 125 Feet Assistive device: Rolling walker (2 wheeled) Gait Pattern/deviations: Step-through pattern;Decreased stride length;Decreased stance time - right Gait velocity: decreased   General Gait Details: cues for proper sequencing   Stairs Stairs: Yes Stairs assistance: Min guard Stair Management: One rail Right;Sideways;Step to pattern Number of Stairs: 4 General stair comments: Pt needing multi-modal cues for transition to/from RW and rails. Once she had her hands on rail  she did well with sideways step to pattern.   Wheelchair Mobility    Modified Rankin (Stroke Patients Only)       Balance Overall balance assessment: Needs assistance Sitting-balance support: No upper extremity supported;Feet supported Sitting balance-Leahy Scale: Good     Standing balance support: During functional activity;Bilateral upper extremity supported Standing balance-Leahy Scale: Fair Standing balance comment: Pt able to take hands off of RW to don robe                            Cognition Arousal/Alertness: Awake/alert Behavior During Therapy: WFL for tasks assessed/performed Overall Cognitive Status: Within Functional Limits for tasks assessed                                        Exercises      General Comments General comments (skin integrity, edema, etc.): Session included ambulation to the bathroom, toilet transfers, stairs, and gait.  At times needed cues to help pt stay on task.      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 PT Goal Formulation: With patient Time For Goal Achievement: 09/08/19 Potential to Achieve Goals: Good Progress towards PT goals: Progressing toward goals    Frequency    7X/week      PT Plan Current plan remains appropriate    Co-evaluation              AM-PAC PT "6 Clicks" Mobility   Outcome Measure  Help needed turning from your back to your side while in a flat bed without using bedrails?: A Little Help needed moving from lying on your back to sitting on the side of a flat bed without using bedrails?: A  Little Help needed moving to and from a bed to a chair (including a wheelchair)?: A Little Help needed standing up from a chair using your arms (e.g., wheelchair or bedside chair)?: A Little Help needed to walk in hospital room?: A Little Help needed climbing 3-5 steps with a railing? : A Little 6 Click Score: 18    End of Session Equipment Utilized During Treatment: Gait belt Activity Tolerance: Patient tolerated treatment well Patient left: in chair;with call bell/phone within reach;with chair alarm set Nurse Communication: Mobility status PT Visit Diagnosis: Muscle weakness (generalized) (M62.81);Difficulty in walking, not elsewhere classified (R26.2)     Time: 8657-8469 PT Time Calculation (min) (ACUTE ONLY): 42 min  Charges:  $Gait Training: 23-37 mins $Therapeutic Activity: 8-22 mins                     Laura Chang, [place] Pager [phone] 09/02/2019    Laura Chang 09/02/2019, 10:41 AM

## 2019-09-02 NOTE — Discharge Summary (Signed)
Patient ID: Laura Chang MRN: [id number] DOB/AGE: 62/26/1957 62 y.o.  Admit date: 09/01/2019 Discharge date: 09/02/2019  Admission Diagnoses:  Principal Problem:   Primary localized osteoarthritis of right hip Active Problems:   Primary osteoarthritis of right hip   Discharge Diagnoses:  Same  Past Medical History:  Diagnosis Date  . ADHD   . Anemia   . Anxiety   . Arthritis   . Depression   . Heart murmur    i went to see a cardiologit slast eyar  and i had zero plaque,   . PONV (postoperative nausea and vomiting)   . Recovering alcoholic in remission [facility])     Surgeries: Procedure(s): RIGHT TOTAL HIP ARTHROPLASTY ANTERIOR APPROACH on 09/01/2019   Consultants:   Discharged Condition: Improved  Hospital Course: Laura Chang is an 63 y.o. female who was admitted 09/01/2019 for operative treatment ofPrimary localized osteoarthritis of right hip. Patient has severe unremitting pain that affects sleep, daily activities, and work/hobbies. After pre-op clearance the patient was taken to the operating room on 09/01/2019 and underwent  Procedure(s): RIGHT TOTAL HIP ARTHROPLASTY ANTERIOR APPROACH.    Patient was given perioperative antibiotics:  Anti-infectives (From admission, onward)   Start     Dose/Rate Route Frequency Ordered Stop   09/01/19 1600  ceFAZolin (ANCEF) IVPB 2g/100 mL premix     2 g 200 mL/hr over 30 Minutes Intravenous Every 6 hours 09/01/19 1320 09/01/19 2229   09/01/19 0800  ceFAZolin (ANCEF) IVPB 2g/100 mL premix     2 g 200 mL/hr over 30 Minutes Intravenous On call to O.R. 09/01/19 0758 09/01/19 1030       Patient was given sequential compression devices, early ambulation, and chemoprophylaxis to prevent DVT.  Patient benefited maximally from hospital stay and there were no complications.    Recent vital signs:  Patient Vitals for the past 24 hrs:  BP Temp Temp src Pulse Resp SpO2 Height Weight  09/02/19 0603 109/79 98.2 F (36.8 C) Oral  79 18 99 % - -  09/02/19 0142 119/88 98.4 F (36.9 C) Oral 92 16 95 % - -  09/01/19 2001 - - - 100 16 - - -  09/01/19 1954 121/85 99.5 F (37.5 C) Oral (!) 109 20 96 % - -  09/01/19 1626 (!) 155/90 99.2 F (37.3 C) Oral 93 16 96 % - -  09/01/19 1513 136/81 98.8 F (37.1 C) Oral (!) 101 16 99 % - -  09/01/19 1417 (!) 148/92 98.5 F (36.9 C) Oral 79 16 100 % - -  09/01/19 1318 131/87 98.3 F (36.8 C) Oral 65 16 100 % 5\' 5"  (1.651 m) 78.5 kg  09/01/19 1245 122/81 98.4 F (36.9 C) - 64 10 100 % - -  09/01/19 1230 102/82 - - 60 20 100 % - -  09/01/19 1215 (!) 122/107 - - 63 16 100 % - -  09/01/19 1200 118/70 - - 72 19 100 % - -  09/01/19 1145 110/69 - - (!) 59 11 100 % - -  09/01/19 1130 (!) 94/56 97.6 F (36.4 C) - 61 14 97 % - -  09/01/19 0822 (!) 141/90 99 F (37.2 C) Oral 71 18 100 % - -  09/01/19 0814 - - - - - - 5\' 5"  (1.651 m) 78.5 kg     Recent laboratory studies:  Recent Labs    09/02/19 0249  WBC 8.5  HGB 9.6*  HCT 28.8*  PLT 212  NA 134*  K 3.7  CL 105  CO2 20*  BUN 15  CREATININE 0.76  GLUCOSE 129*  CALCIUM 8.2*     Discharge Medications:   Allergies as of 09/02/2019      Reactions   Metronidazole Shortness Of Breath, Other (See Comments)   Heart pounding   Ferrlecit [na Ferric Gluc Cplx In Sucrose] Other (See Comments)   Infusion reaction 05/12/2019      Medication List    STOP taking these medications   methocarbamol 500 MG tablet Commonly known as: ROBAXIN     TAKE these medications   amphetamine-dextroamphetamine 20 MG 24 hr capsule Commonly known as: ADDERALL XR Take 1 capsule (20 mg total) by mouth 2 (two) times daily.   amphetamine-dextroamphetamine 20 MG 24 hr capsule Commonly known as: ADDERALL XR Take 1 capsule (20 mg total) by mouth 2 (two) times daily.   aspirin 81 MG chewable tablet Chew 1 tablet (81 mg total) by mouth 2 (two) times daily.   buPROPion 300 MG 24 hr tablet Commonly known as: WELLBUTRIN XL Take 300 mg by  mouth daily.   FLUoxetine 20 MG capsule Commonly known as: PROZAC TAKE 3 CAPSULES BY MOUTH EVERY DAY   iron polysaccharides 150 MG capsule Commonly known as: NIFEREX Take 150 mg by mouth daily.   LORazepam 1 MG tablet Commonly known as: ATIVAN Take 1 tablet (1 mg total) by mouth daily as needed for anxiety.   pantoprazole 40 MG tablet Commonly known as: PROTONIX Take 1 tablet (40 mg total) by mouth daily.   tiZANidine 4 MG tablet Commonly known as: Zanaflex Take 1 tablet (4 mg total) by mouth every 6 (six) hours as needed.   traMADol 50 MG tablet Commonly known as: ULTRAM Take 1 tablet (50 mg total) by mouth every 6 (six) hours as needed for moderate pain.   traZODone 50 MG tablet Commonly known as: DESYREL Take 50 mg by mouth at bedtime as needed for sleep.            Durable Medical Equipment  (From admission, onward)         Start     Ordered   09/01/19 1321  DME Walker rolling  Once    Question:  Patient needs a walker to treat with the following condition  Answer:  Primary osteoarthritis of right hip   09/01/19 1320   09/01/19 1321  DME 3 n 1  Once     09/01/19 1320   09/01/19 1321  DME Bedside commode  Once    Question:  Patient needs a bedside commode to treat with the following condition  Answer:  Primary osteoarthritis of right hip   09/01/19 1320          Diagnostic Studies: Dg Chest 2 View  Result Date: 08/28/2019 CLINICAL DATA:  63 year old female under preoperative evaluation for hip surgery. EXAM: CHEST - 2 VIEW COMPARISON:  Chest x-ray 08/15/2018. FINDINGS: Lung volumes are normal. No consolidative airspace disease. No pleural effusions. No pneumothorax. No pulmonary nodule or mass noted. Large hiatal hernia. Pulmonary vasculature and the cardiomediastinal silhouette are otherwise within normal limits. IMPRESSION: 1.  No radiographic evidence of acute cardiopulmonary disease. 2. Large hiatal hernia. Electronically Signed   By: Trudie Reed  M.D.   On: 08/28/2019 13:09   Dg C-arm 1-60 Min-no Report  Result Date: 09/01/2019 Fluoroscopy was utilized by the requesting physician.  No radiographic interpretation.   Dg Hip Operative Unilat With Pelvis Right  Result Date: 09/01/2019 CLINICAL DATA:  Right hip replacement EXAM: OPERATIVE RIGHT HIP (WITH PELVIS IF PERFORMED) 5 VIEWS TECHNIQUE: Fluoroscopic spot image(s) were submitted for interpretation post-operatively. COMPARISON:  None. FINDINGS: Multiple intraoperative spot images demonstrate changes of right hip replacement. No hardware or bony complicating feature. IMPRESSION: Right hip replacement.  No visible complicating feature. Electronically Signed   By: Rolm Baptise M.D.   On: 09/01/2019 11:38    Disposition: Discharge disposition: 01-Home or Self Care       Discharge Instructions    Call MD / Call 911   Complete by: As directed    If you experience chest pain or shortness of breath, CALL 911 and be transported to the hospital emergency room.  If you develope a fever above 101 F, pus (white drainage) or increased drainage or redness at the wound, or calf pain, call your surgeon's office.   Constipation Prevention   Complete by: As directed    Drink plenty of fluids.  Prune juice may be helpful.  You may use a stool softener, such as Colace (over the counter) 100 mg twice a day.  Use MiraLax (over the counter) for constipation as needed.   Diet - low sodium heart healthy   Complete by: As directed    Discharge instructions   Complete by: As directed    INSTRUCTIONS AFTER JOINT REPLACEMENT   Remove items at home which could result in a fall. This includes throw rugs or furniture in walking pathways ICE to the affected joint every three hours while awake for 30 minutes at a time, for at least the first 3-5 days, and then as needed for pain and swelling.  Continue to use ice for pain and swelling. You may notice swelling that will progress down to the foot and ankle.   This is normal after surgery.  Elevate your leg when you are not up walking on it.   Continue to use the breathing machine you got in the hospital (incentive spirometer) which will help keep your temperature down.  It is common for your temperature to cycle up and down following surgery, especially at night when you are not up moving around and exerting yourself.  The breathing machine keeps your lungs expanded and your temperature down.   DIET:  As you were doing prior to hospitalization, we recommend a well-balanced diet.  DRESSING / WOUND CARE / SHOWERING  You may shower 3 days after surgery, but keep the wounds dry during showering.  You may use an occlusive plastic wrap (Press'n Seal for example), NO SOAKING/SUBMERGING IN THE BATHTUB.  If the bandage gets wet, change with a clean dry gauze.  If the incision gets wet, pat the wound dry with a clean towel.  ACTIVITY  Increase activity slowly as tolerated, but follow the weight bearing instructions below.   No driving for 6 weeks or until further direction given by your physician.  You cannot drive while taking narcotics.  No lifting or carrying greater than 10 lbs. until further directed by your surgeon. Avoid periods of inactivity such as sitting longer than an hour when not asleep. This helps prevent blood clots.  You may return to work once you are authorized by your doctor.     WEIGHT BEARING   Weight bearing as tolerated with assist device (walker, cane, etc) as directed, use it as long as suggested by your surgeon or therapist, typically at least 4-6 weeks.   EXERCISES  Results after joint replacement surgery are often greatly improved when you follow  the exercise, range of motion and muscle strengthening exercises prescribed by your doctor. Safety measures are also important to protect the joint from further injury. Any time any of these exercises cause you to have increased pain or swelling, decrease what you are doing until you  are comfortable again and then slowly increase them. If you have problems or questions, call your caregiver or physical therapist for advice.   Rehabilitation is important following a joint replacement. After just a few days of immobilization, the muscles of the leg can become weakened and shrink (atrophy).  These exercises are designed to build up the tone and strength of the thigh and leg muscles and to improve motion. Often times heat used for twenty to thirty minutes before working out will loosen up your tissues and help with improving the range of motion but do not use heat for the first two weeks following surgery (sometimes heat can increase post-operative swelling).   These exercises can be done on a training (exercise) mat, on the floor, on a table or on a bed. Use whatever works the best and is most comfortable for you.    Use music or television while you are exercising so that the exercises are a pleasant break in your day. This will make your life better with the exercises acting as a break in your routine that you can look forward to.   Perform all exercises about fifteen times, three times per day or as directed.  You should exercise both the operative leg and the other leg as well.   Exercises include:   Quad Sets - Tighten up the muscle on the front of the thigh (Quad) and hold for 5-10 seconds.   Straight Leg Raises - With your knee straight (if you were given a brace, keep it on), lift the leg to 60 degrees, hold for 3 seconds, and slowly lower the leg.  Perform this exercise against resistance later as your leg gets stronger.  Leg Slides: Lying on your back, slowly slide your foot toward your buttocks, bending your knee up off the floor (only go as far as is comfortable). Then slowly slide your foot back down until your leg is flat on the floor again.  Angel Wings: Lying on your back spread your legs to the side as far apart as you can without causing discomfort.  Hamstring Strength:   Lying on your back, push your heel against the floor with your leg straight by tightening up the muscles of your buttocks.  Repeat, but this time bend your knee to a comfortable angle, and push your heel against the floor.  You may put a pillow under the heel to make it more comfortable if necessary.   A rehabilitation program following joint replacement surgery can speed recovery and prevent re-injury in the future due to weakened muscles. Contact your doctor or a physical therapist for more information on knee rehabilitation.    CONSTIPATION  Constipation is defined medically as fewer than three stools per week and severe constipation as less than one stool per week.  Even if you have a regular bowel pattern at home, your normal regimen is likely to be disrupted due to multiple reasons following surgery.  Combination of anesthesia, postoperative narcotics, change in appetite and fluid intake all can affect your bowels.   YOU MUST use at least one of the following options; they are listed in order of increasing strength to get the job done.  They are all available over  the counter, and you may need to use some, POSSIBLY even all of these options:    Drink plenty of fluids (prune juice may be helpful) and high fiber foods Colace 100 mg by mouth twice a day  Senokot for constipation as directed and as needed Dulcolax (bisacodyl), take with full glass of water  Miralax (polyethylene glycol) once or twice a day as needed.  If you have tried all these things and are unable to have a bowel movement in the first 3-4 days after surgery call either your surgeon or your primary doctor.    If you experience loose stools or diarrhea, hold the medications until you stool forms back up.  If your symptoms do not get better within 1 week or if they get worse, check with your doctor.  If you experience "the worst abdominal pain ever" or develop nausea or vomiting, please contact the office immediately for further  recommendations for treatment.   ITCHING:  If you experience itching with your medications, try taking only a single pain pill, or even half a pain pill at a time.  You can also use Benadryl over the counter for itching or also to help with sleep.   TED HOSE STOCKINGS:  Use stockings on both legs until for at least 2 weeks or as directed by physician office. They may be removed at night for sleeping.  MEDICATIONS:  See your medication summary on the "After Visit Summary" that nursing will review with you.  You may have some home medications which will be placed on hold until you complete the course of blood thinner medication.  It is important for you to complete the blood thinner medication as prescribed.  PRECAUTIONS:  If you experience chest pain or shortness of breath - call 911 immediately for transfer to the hospital emergency department.   If you develop a fever greater that 101 F, purulent drainage from wound, increased redness or drainage from wound, foul odor from the wound/dressing, or calf pain - CONTACT YOUR SURGEON.                                                   FOLLOW-UP APPOINTMENTS:  If you do not already have a post-op appointment, please call the office for an appointment to be seen by your surgeon.  Guidelines for how soon to be seen are listed in your "After Visit Summary", but are typically between 1-4 weeks after surgery.  OTHER INSTRUCTIONS:   Knee Replacement:  Do not place pillow under knee, focus on keeping the knee straight while resting. CPM instructions: 0-90 degrees, 2 hours in the morning, 2 hours in the afternoon, and 2 hours in the evening. Place foam block, curve side up under heel at all times except when in CPM or when walking.  DO NOT modify, tear, cut, or change the foam block in any way.  MAKE SURE YOU:  Understand these instructions.  Get help right away if you are not doing well or get worse.    Thank you for letting us be a part of your medical  care team.  It is a privilege we respect greatly.  We hope these instructions will help you stay on track for a fast and full recovery!   Increase activity slowly as tolerated   Complete by: As directed  Follow-up Information    Marcene Corning, MD. Schedule an appointment as soon as possible for a visit in 2 weeks.   Specialty: Orthopedic Surgery Contact information: [address] [place] [address] [phone]            Signed: Ginger Organ Othman Masur 09/02/2019, 8:11 AM

## 2019-09-02 NOTE — Plan of Care (Signed)
Plan of care reviewed and discussed with the patient. 

## 2019-09-02 NOTE — Plan of Care (Signed)
done

## 2019-09-02 NOTE — TOC Transition Note (Signed)
Transition of Care [facility]) - CM/SW Discharge Note   Patient Details  Name: VICTORYA HILLMAN MRN: [id number] Date of Birth: 1956-08-10  Transition of [facility] CM/SW Contact:  Lia Hopping, [place] Phone Number: 09/02/2019, 10:36 AM   Clinical Narrative:    Therapy Plan: [place] and 3 IN 1 ordered.    Final next level of care: [place] Barriers to Discharge: No Barriers Identified   Patient Goals and CMS Choice Patient states their goals for this hospitalization and ongoing recovery are:: to get better CMS Medicare.gov Compare Post Acute Care list provided to:: Patient(Arranged in Orthopedic Office) Choice offered to / list presented to : Patient  Discharge Placement                       Discharge Plan and Services                DME Arranged: 3-N-1, Walker rolling DME Agency: [employer] Date DME Agency Contacted: 09/02/19 Time DME Agency Contacted: 0900 Representative spoke with at DME Agency: [place]: [facility] (formerly [employer]) Date [place]: 09/02/19 Time [place]: 1036 Representative spoke with at [place]: [place] ([place]) Interventions     Readmission Risk Interventions No flowsheet data found.

## 2019-11-12 ENCOUNTER — Encounter: Payer: Self-pay | Admitting: Psychiatry

## 2019-11-12 ENCOUNTER — Other Ambulatory Visit: Payer: Self-pay

## 2019-11-12 ENCOUNTER — Ambulatory Visit (INDEPENDENT_AMBULATORY_CARE_PROVIDER_SITE_OTHER): Payer: 59 | Admitting: Psychiatry

## 2019-11-12 DIAGNOSIS — F331 Major depressive disorder, recurrent, moderate: Secondary | ICD-10-CM | POA: Diagnosis not present

## 2019-11-12 DIAGNOSIS — F1020 Alcohol dependence, uncomplicated: Secondary | ICD-10-CM

## 2019-11-12 DIAGNOSIS — F900 Attention-deficit hyperactivity disorder, predominantly inattentive type: Secondary | ICD-10-CM | POA: Diagnosis not present

## 2019-11-12 DIAGNOSIS — F411 Generalized anxiety disorder: Secondary | ICD-10-CM

## 2019-11-12 DIAGNOSIS — F5105 Insomnia due to other mental disorder: Secondary | ICD-10-CM

## 2019-11-12 MED ORDER — AMPHETAMINE-DEXTROAMPHET ER 20 MG PO CP24: ORAL_CAPSULE | ORAL | 0 refills | Status: DC

## 2019-11-12 MED ORDER — TRAZODONE HCL 50 MG PO TABS: 50.0000 mg | ORAL_TABLET | Freq: Every evening | ORAL | 0 refills | Status: DC | PRN

## 2019-11-12 MED ORDER — FLUOXETINE HCL 20 MG PO CAPS: 20.0000 mg | ORAL_CAPSULE | Freq: Every day | ORAL | 0 refills | Status: DC

## 2019-11-12 NOTE — Patient Instructions (Addendum)
Stop Strattera Cut down caffeine Restart Adderall 1 daily for a few days and if tolerated then restart 1 twice daily.  If not tolerated reduce the dosage if needed.  Stop Wellbutrin Restart Prozac 1 daily

## 2019-11-12 NOTE — Progress Notes (Signed)
Laura Chang [id number] 10/03/1956 63 y.o.  Subjective:   Patient ID:  Laura Chang is a 63 y.o. (DOB [birth year]) female.  Chief Complaint:  Chief Complaint  Patient presents with  . Follow-up    Medication management  . ADHD    Medication management  . Medication Refill    Trazodone    HPI Laura Chang presents to the office today for follow-up of depression and anxiety and ADD.  Last seen January 2020.  No meds were changed.  Melted down in 2020.  Went to [facility] in July.  No withdrawal.  1 drink since then.  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  PTH started drinking in the morning.  Noone in the house, [employer]  Middie and her H broke up.  Stress with Trump.  Got really involved in politics and couldn't do anything without drinking.  Before lunch had whole bottle of wine.  Kids confronted her and family caught her hiding.  Couldn't quit.  In June Josie confronted her and went to treatment in July.  Has participated in AA since then and been sober.   Daily meetings.  Has a sponsor.    Getting along with H great.  H supportive.  H supportive of restarting Adderall.   Real problems with ADD is the major problem.  Had THR and handled it.  More depressed lately.  Feels it's bc of the ADD.     Still having brain shutters since stopped the Prozac.  Was on 60 mg daily.  Started back with devotions and stronger faith.  Past Psychiatric Medication Trials: fluoxetine, duloxetine, Viibryd, lamotrigine, Pristiq, sertraline, citalopram Adderall, Adderall XR, Vyvanseb Lorazepam Trazodone Wellbutrin Depakote  Review of Systems:  Review of Systems  Musculoskeletal: Positive for arthralgias, back pain and joint swelling.  Neurological: Negative for tremors and weakness.  Psychiatric/Behavioral: Positive for decreased concentration and dysphoric mood. Negative for agitation, behavioral problems, confusion, hallucinations, self-injury, sleep  disturbance and suicidal ideas. The patient is nervous/anxious. The patient is not hyperactive.     Medications: I have reviewed the patient's current medications.  Current Outpatient Medications  Medication Sig Dispense Refill  . iron polysaccharides (NIFEREX) 150 MG capsule Take 150 mg by mouth daily.    . traZODone (DESYREL) 50 MG tablet Take 1 tablet (50 mg total) by mouth at bedtime as needed for sleep. 90 tablet 0  . amphetamine-dextroamphetamine (ADDERALL XR) 20 MG 24 hr capsule 1 twice daily 60 capsule 0  . FLUoxetine (PROZAC) 20 MG capsule Take 1 capsule (20 mg total) by mouth daily. 90 capsule 0   No current facility-administered medications for this visit.    Medication Side Effects: None  Allergies:  Allergies  Allergen Reactions  . Metronidazole Shortness Of Breath and Other (See Comments)    Heart pounding  . Ferrlecit [Na Ferric Gluc Cplx In Sucrose] Other (See Comments)    Infusion reaction 05/12/2019    Past Medical History:  Diagnosis Date  . ADHD   . Anemia   . Anxiety   . Arthritis   . Depression   . Heart murmur    i went to see a cardiologit slast eyar  and i had zero plaque,   . PONV (postoperative nausea and vomiting)   . Recovering alcoholic in remission [facility])     Family History  Problem Relation Age of Onset  . Atrial fibrillation Mother   . CAD Father     Social History  Socioeconomic History  . Marital status: Married    Spouse name: Not on file  . Number of children: Not on file  . Years of education: Not on file  . Highest education level: Not on file  Occupational History  . Not on file  Tobacco Use  . Smoking status: Former Smoker    Quit date: 08/16/2003    Years since quitting: 16.2  . Smokeless tobacco: Never Used  . Tobacco comment:  08-28-2019 "i smoked 2 cigarettes in the last month since my father  passed"  Substance and Sexual Activity  . Alcohol use: Yes    Alcohol/week: 10.0 standard drinks    Types: 10 Glasses  of wine per week  . Drug use: No  . Sexual activity: Not on file  Other Topics Concern  . Not on file  Social History Narrative  . Not on file   Social Determinants of Health   Financial Resource Strain:   . Difficulty of Paying Living Expenses: Not on file  Food Insecurity:   . Worried About Charity fundraiser in the Last Year: Not on file  . Ran Out of Food in the Last Year: Not on file  Transportation Needs:   . Lack of Transportation (Medical): Not on file  . Lack of Transportation (Non-Medical): Not on file  Physical Activity:   . Days of Exercise per Week: Not on file  . Minutes of Exercise per Session: Not on file  Stress:   . Feeling of Stress : Not on file  Social Connections:   . Frequency of Communication with Friends and Family: Not on file  . Frequency of Social Gatherings with Friends and Family: Not on file  . Attends Religious Services: Not on file  . Active Member of Clubs or Organizations: Not on file  . Attends Archivist Meetings: Not on file  . Marital Status: Not on file  Intimate Partner Violence:   . Fear of Current or Ex-Partner: Not on file  . Emotionally Abused: Not on file  . Physically Abused: Not on file  . Sexually Abused: Not on file    Past Medical History, Surgical history, Social history, and Family history were reviewed and updated as appropriate.   Please see review of systems for further details on the patient's review from today.   Objective:   Physical Exam:  There were no vitals taken for this visit.  Physical Exam Constitutional:      General: She is not in acute distress.    Appearance: She is well-developed.  Musculoskeletal:        General: No deformity.  Neurological:     Mental Status: She is alert and oriented to person, place, and time.     Motor: No tremor.     Coordination: Coordination normal.     Gait: Gait normal.  Psychiatric:        Attention and Perception: Perception normal. She is  inattentive.        Mood and Affect: Mood is anxious and depressed. Affect is not labile, blunt, angry or inappropriate.        Speech: Speech is rapid and pressured.        Behavior: Behavior normal.        Thought Content: Thought content normal. Thought content does not include homicidal or suicidal ideation. Thought content does not include homicidal or suicidal plan.        Cognition and Memory: Cognition normal.  Judgment: Judgment normal.     Comments: Insight intact. No auditory or visual hallucinations. No delusions.      Lab Review:     Component Value Date/Time   NA 134 (L) 09/02/2019 0249   NA 140 11/18/2018 1544   K 3.7 09/02/2019 0249   CL 105 09/02/2019 0249   CO2 20 (L) 09/02/2019 0249   GLUCOSE 129 (H) 09/02/2019 0249   BUN 15 09/02/2019 0249   BUN 20 11/18/2018 1544   CREATININE 0.76 09/02/2019 0249   CREATININE 0.78 01/27/2019 0811   CALCIUM 8.2 (L) 09/02/2019 0249   PROT 7.1 01/27/2019 0811   ALBUMIN 3.7 01/27/2019 0811   AST 14 (L) 01/27/2019 0811   ALT 14 01/27/2019 0811   ALKPHOS 54 01/27/2019 0811   BILITOT 0.5 01/27/2019 0811   GFRNONAA >60 09/02/2019 0249   GFRNONAA >60 01/27/2019 0811   GFRAA >60 09/02/2019 0249   GFRAA >60 01/27/2019 0811       Component Value Date/Time   WBC 8.5 09/02/2019 0249   RBC 2.99 (L) 09/02/2019 0249   HGB 9.6 (L) 09/02/2019 0249   HGB 12.9 07/17/2019 0953   HCT 28.8 (L) 09/02/2019 0249   HCT 21.9 (L) 12/25/2018 1221   PLT 212 09/02/2019 0249   PLT 286 07/17/2019 0953   MCV 96.3 09/02/2019 0249   MCH 32.1 09/02/2019 0249   MCHC 33.3 09/02/2019 0249   RDW 12.6 09/02/2019 0249   LYMPHSABS 1.2 08/28/2019 0900   MONOABS 0.4 08/28/2019 0900   EOSABS 0.1 08/28/2019 0900   BASOSABS 0.0 08/28/2019 0900    No results found for: POCLITH, LITHIUM   No results found for: PHENYTOIN, PHENOBARB, VALPROATE, CBMZ   .res Assessment: Plan:    Attention deficit hyperactivity disorder (ADHD), predominantly  inattentive type - Plan: amphetamine-dextroamphetamine (ADDERALL XR) 20 MG 24 hr capsule  Generalized anxiety disorder - Plan: FLUoxetine (PROZAC) 20 MG capsule  Major depressive disorder, recurrent episode, moderate (HCC) - Plan: FLUoxetine (PROZAC) 20 MG capsule  Alcohol use disorder, moderate, dependence (HCC)  Insomnia due to mental condition - Plan: traZODone (DESYREL) 50 MG tablet   Greater than 50% of 40 min face to face time with patient was spent on counseling and coordination of care. We discussed Patient developed an increasingly severe alcohol dependence problem since her last visit in January.  She went to [facility] and has had no alcohol since then except 1 day.  She never abused stimulants but they took her off the stimulants at [facility].  Her ADD is markedly worse.  The Wellbutrin has not helped the ADD.  She also feels somewhat more depressed but feels part of the problem is because her ADD is so out of control without the stimulant.  No benefit likely with low dose Strattera and not likely to get as significant an effect vs stimulant for ADD.  ADD is major px causing mood sx.  Patient has been under my care for many years.  There is no history of stimulant abuse and no other substance abuse including no marijuana abuse.  We discussed the pros and cons of potentially restarting the Adderall because her ADD is out of control.  Alcohol is a sedative and Adderall as a stimulant so they produce markedly different effects.  I do not believe the patient is a significant risk for abusing Adderall nor do I believe the patient is likely to experience craving for alcohol if she uses Adderall.  Discussed potential benefits, risks, and side  effects of stimulants with patient to include increased heart rate, palpitations, insomnia, increased anxiety, increased irritability, or decreased appetite.  Instructed patient to contact office if experiencing any significant tolerability  issues.  Self care and CBT on age issues and health stress.  Work on self care.  Watch alcohol, "i'm trying".  Stop Strattera. OK restart stimulant bc severe ADD Restart Adderall 1 daily for a few days and if tolerated then restart 1 twice daily. If not tolerated reduce the dosage if needed.  May need to stop Wellbutrin if not tolerating the stimulant.  Yes.  DC Wellbutrin Restart Prozac 20 mg daily. Historically fluoxetine was more effective than other antidepressants.  She was last taking 60 mg a day but she may not need this much since she has been off of it for several months and she is not drinking now.  She may need to increase fluoxetine at follow-up.  No further prescriptions for benzodiazepines.  She agrees.  FU 6 weeks  Meredith Staggersarey Cottle, MD, DFAPA     Please see After Visit Summary for patient specific instructions.  Future Appointments  Date Time Provider Department Center  01/14/2020 11:30 AM [facility] 3 [facility] None  01/14/2020 12:00 PM Johney MaineKale, Gautam Kishore, [facility]    No orders of the defined types were placed in this encounter.     -------------------------------

## 2019-11-19 ENCOUNTER — Telehealth: Payer: Self-pay

## 2019-11-19 NOTE — Telephone Encounter (Signed)
Patient's insurance [employer] with [employer] require her ADDERALL XR 20 MG Capsule be written as BRAND, NO PA required. Pharmacy aware and has submitted it as this.

## 2019-12-17 ENCOUNTER — Other Ambulatory Visit: Payer: Self-pay

## 2019-12-17 ENCOUNTER — Ambulatory Visit (INDEPENDENT_AMBULATORY_CARE_PROVIDER_SITE_OTHER): Payer: 59 | Admitting: Psychiatry

## 2019-12-17 ENCOUNTER — Encounter: Payer: Self-pay | Admitting: Psychiatry

## 2019-12-17 VITALS — BP 137/91 | HR 87

## 2019-12-17 DIAGNOSIS — F5105 Insomnia due to other mental disorder: Secondary | ICD-10-CM

## 2019-12-17 DIAGNOSIS — F411 Generalized anxiety disorder: Secondary | ICD-10-CM

## 2019-12-17 DIAGNOSIS — F331 Major depressive disorder, recurrent, moderate: Secondary | ICD-10-CM | POA: Diagnosis not present

## 2019-12-17 DIAGNOSIS — F900 Attention-deficit hyperactivity disorder, predominantly inattentive type: Secondary | ICD-10-CM

## 2019-12-17 DIAGNOSIS — F1021 Alcohol dependence, in remission: Secondary | ICD-10-CM

## 2019-12-17 MED ORDER — AMPHETAMINE-DEXTROAMPHET ER 20 MG PO CP24: 20.0000 mg | ORAL_CAPSULE | Freq: Every day | ORAL | 0 refills | Status: DC

## 2019-12-17 MED ORDER — BUPROPION HCL ER (XL) 150 MG PO TB24: 450.0000 mg | ORAL_TABLET | Freq: Every day | ORAL | 2 refills | Status: DC

## 2019-12-17 NOTE — Progress Notes (Signed)
TYLIAH Chang [id number] March 05, 1956 64 y.o.  Subjective:   Patient ID:  Laura Chang is a 64 y.o. (DOB 1956-05-04) female.  Chief Complaint:  Chief Complaint  Patient presents with  . Follow-up     Medication management  . ADHD     Medication management  . Depression    HPI Laura Chang presents to the office today for follow-up of depression and anxiety and ADD.  Last seen November 12, 2019.  Melted down in 2020.  Went to [facility] in July.  No withdrawal.  1 drink since then.  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 She was on no stimulant and no SSRI but was taking Strattera and Wellbutrin.  The following changes were made. Stop Strattera. OK restart stimulant bc severe ADD Restart Adderall 1 daily for a few days and if tolerated then restart 1 twice daily. If not tolerated reduce the dosage if needed. May need to stop Wellbutrin if not tolerating the stimulant.  Yes.  DC Wellbutrin Restart Prozac 20 mg daily.  Completed grant proposal.  Couldn't doit without Adderall.  Sold a bunch of work.   Adderall XR lasts about 3 pm.  Strength seems about right.  BP been OK.  Not jittery.   Stopped Wellbutrin but had no SE.  Mood drastically better with grant proposal and back on fluoxetine.  Less depressed and lethargic.  No anxiety.  Cut back on coffee.  Started back with devotions and stronger faith.  Past Psychiatric Medication Trials: fluoxetine, duloxetine, Viibryd, lamotrigine, Pristiq, sertraline, citalopram Adderall, Adderall XR, Vyvanse, Strattera low dose NR Lorazepam Trazodone Wellbutrin Depakote  Review of Systems:  Review of Systems  Musculoskeletal: Positive for arthralgias, back pain and joint swelling.       SP hip surgery October 2020  Neurological: Negative for tremors and weakness.  Psychiatric/Behavioral: Negative for agitation, behavioral problems, confusion, decreased concentration, dysphoric mood, hallucinations,  self-injury, sleep disturbance and suicidal ideas. The patient is not nervous/anxious and is not hyperactive.     Medications: I have reviewed the patient's current medications.  Current Outpatient Medications  Medication Sig Dispense Refill  . amphetamine-dextroamphetamine (ADDERALL XR) 20 MG 24 hr capsule Take 1 capsule (20 mg total) by mouth daily. 30 capsule 0  . FLUoxetine (PROZAC) 20 MG capsule Take 1 capsule (20 mg total) by mouth daily. 90 capsule 0  . iron polysaccharides (NIFEREX) 150 MG capsule Take 150 mg by mouth daily.    . traZODone (DESYREL) 50 MG tablet Take 1 tablet (50 mg total) by mouth at bedtime as needed for sleep. 90 tablet 0  . [START ON 01/14/2020] amphetamine-dextroamphetamine (ADDERALL XR) 20 MG 24 hr capsule Take 1 capsule (20 mg total) by mouth daily. 30 capsule 0  . [START ON 02/11/2020] amphetamine-dextroamphetamine (ADDERALL XR) 20 MG 24 hr capsule Take 1 capsule (20 mg total) by mouth daily. 30 capsule 0  . buPROPion (WELLBUTRIN XL) 150 MG 24 hr tablet Take 3 tablets (450 mg total) by mouth daily. 90 tablet 2   No current facility-administered medications for this visit.    Medication Side Effects: None  Allergies:  Allergies  Allergen Reactions  . Metronidazole Shortness Of Breath and Other (See Comments)    Heart pounding  . Ferrlecit [Na Ferric Gluc Cplx In Sucrose] Other (See Comments)    Infusion reaction 05/12/2019    Past Medical History:  Diagnosis Date  . ADHD   . Anemia   . Anxiety   .  Arthritis   . Depression   . Heart murmur    i went to see a cardiologit [facility]  and i had zero plaque,   . PONV (postoperative nausea and vomiting)   . Recovering alcoholic in remission [facility])     Family History  Problem Relation Age of Onset  . Atrial fibrillation Mother   . CAD Father     Social History   Socioeconomic History  . Marital status: Married    Spouse name: Not on file  . Number of children: Not on file  . Years of education:  Not on file  . Highest education level: Not on file  Occupational History  . Not on file  Tobacco Use  . Smoking status: Former Smoker    Quit date: 08/16/2003    Years since quitting: 16.3  . Smokeless tobacco: Never Used  . Tobacco comment:  08-28-2019 "i smoked 2 cigarettes in the last month since my father  passed"  Substance and Sexual Activity  . Alcohol use: Yes    Alcohol/week: 10.0 standard drinks    Types: 10 Glasses of wine per week  . Drug use: No  . Sexual activity: Not on file  Other Topics Concern  . Not on file  Social History Narrative  . Not on file   Social Determinants of Health   Financial Resource Strain:   . Difficulty of Paying Living Expenses: Not on file  Food Insecurity:   . Worried About Programme researcher, broadcasting/film/video in the Last Year: Not on file  . Ran Out of Food in the Last Year: Not on file  Transportation Needs:   . Lack of Transportation (Medical): Not on file  . Lack of Transportation (Non-Medical): Not on file  Physical Activity:   . Days of Exercise per Week: Not on file  . Minutes of Exercise per Session: Not on file  Stress:   . Feeling of Stress : Not on file  Social Connections:   . Frequency of Communication with Friends and Family: Not on file  . Frequency of Social Gatherings with Friends and Family: Not on file  . Attends Religious Services: Not on file  . Active Member of Clubs or Organizations: Not on file  . Attends Banker Meetings: Not on file  . Marital Status: Not on file  Intimate Partner Violence:   . Fear of Current or Ex-Partner: Not on file  . Emotionally Abused: Not on file  . Physically Abused: Not on file  . Sexually Abused: Not on file    Past Medical History, Surgical history, Social history, and Family history were reviewed and updated as appropriate.   Please see review of systems for further details on the patient's review from today.   Objective:   Physical Exam:  BP (!) 137/91   Pulse 87    Physical Exam Constitutional:      General: She is not in acute distress.    Appearance: She is well-developed.  Musculoskeletal:        General: No deformity.  Neurological:     Mental Status: She is alert and oriented to person, place, and time.     Motor: No tremor.     Coordination: Coordination normal.     Gait: Gait normal.  Psychiatric:        Attention and Perception: Perception normal. She is attentive.        Mood and Affect: Mood is not anxious or depressed. Affect is not  labile, blunt, angry or inappropriate.        Speech: Speech is not rapid and pressured.        Behavior: Behavior normal.        Thought Content: Thought content normal. Thought content does not include homicidal or suicidal ideation. Thought content does not include homicidal or suicidal plan.        Cognition and Memory: Cognition normal.        Judgment: Judgment normal.     Comments: Insight intact. No auditory or visual hallucinations. No delusions.  Affect is upbeat and calm.     Lab Review:     Component Value Date/Time   NA 134 (L) 09/02/2019 0249   NA 140 11/18/2018 1544   K 3.7 09/02/2019 0249   CL 105 09/02/2019 0249   CO2 20 (L) 09/02/2019 0249   GLUCOSE 129 (H) 09/02/2019 0249   BUN 15 09/02/2019 0249   BUN 20 11/18/2018 1544   CREATININE 0.76 09/02/2019 0249   CREATININE 0.78 01/27/2019 0811   CALCIUM 8.2 (L) 09/02/2019 0249   PROT 7.1 01/27/2019 0811   ALBUMIN 3.7 01/27/2019 0811   AST 14 (L) 01/27/2019 0811   ALT 14 01/27/2019 0811   ALKPHOS 54 01/27/2019 0811   BILITOT 0.5 01/27/2019 0811   GFRNONAA >60 09/02/2019 0249   GFRNONAA >60 01/27/2019 0811   GFRAA >60 09/02/2019 0249   GFRAA >60 01/27/2019 0811       Component Value Date/Time   WBC 8.5 09/02/2019 0249   RBC 2.99 (L) 09/02/2019 0249   HGB 9.6 (L) 09/02/2019 0249   HGB 12.9 07/17/2019 0953   HCT 28.8 (L) 09/02/2019 0249   HCT 21.9 (L) 12/25/2018 1221   PLT 212 09/02/2019 0249   PLT 286 07/17/2019  0953   MCV 96.3 09/02/2019 0249   MCH 32.1 09/02/2019 0249   MCHC 33.3 09/02/2019 0249   RDW 12.6 09/02/2019 0249   LYMPHSABS 1.2 08/28/2019 0900   MONOABS 0.4 08/28/2019 0900   EOSABS 0.1 08/28/2019 0900   BASOSABS 0.0 08/28/2019 0900    No results found for: POCLITH, LITHIUM   No results found for: PHENYTOIN, PHENOBARB, VALPROATE, CBMZ   .res Assessment: Plan:    Major depressive disorder, recurrent episode, moderate ([place]) - Plan: buPROPion (WELLBUTRIN XL) 150 MG 24 hr tablet  Attention deficit hyperactivity disorder (ADHD), predominantly inattentive type - Plan: amphetamine-dextroamphetamine (ADDERALL XR) 20 MG 24 hr capsule, amphetamine-dextroamphetamine (ADDERALL XR) 20 MG 24 hr capsule, amphetamine-dextroamphetamine (ADDERALL XR) 20 MG 24 hr capsule  Generalized anxiety disorder  Insomnia due to mental condition  Alcohol use disorder, moderate, in early remission, dependence ([place])   At visit November 12, 2019. We discussed Patient developed an increasingly severe alcohol dependence problem since her last visit in January.  She went to [facility] and has had no alcohol since then except 1 day.  She never abused stimulants but they took her off the stimulants at [facility].  Her ADD was markedly worse.  The Wellbutrin did not helped the ADD.   No benefit likely with low dose Strattera and not likely to get as significant an effect vs stimulant for ADD.  ADD is major px causing mood sx.  Therefore at the visit December 2020, Strattera and Wellbutrin were stopped.  Fluoxetine was restarted at a lower dose than she has typically taken at 20 mg daily.  Adderall was prescribed as Adderall XR 20 mg twice daily.  Insurance would only cover it once a  day.  Her mood and anxiety are under good control.  The Adderall XR lasts until about 3 PM.  We discussed the alternative of switching to Adzenys which we can probably get covered for 2 daily.  Overall she is satisfied enough with  the duration of 1 Adderall XR at this time.  She has been much more productive with work back on the Adderall.  The dose is adequate and tolerated.  She wonders about adding a Wellbutrin back as an alternative to increasing the stimulant.  Patient has been under my care for many years.  There is no history of stimulant abuse and no other substance abuse including no marijuana abuse.  We discussed the pros and cons of potentially restarting the Adderall because her ADD is out of control.  Alcohol is a sedative and Adderall as a stimulant so they produce markedly different effects.  I do not believe the patient is a significant risk for abusing Adderall nor do I believe the patient is likely to experience craving for alcohol if she uses Adderall.  There is been no abuse of Adderall and she is remained sober from alcohol.  Discussed potential benefits, risks, and side effects of stimulants with patient to include increased heart rate, palpitations, insomnia, increased anxiety, increased irritability, or decreased appetite.  Instructed patient to contact office if experiencing any significant tolerability issues.  Working on sobritety and has been successful so far.    Continue Prozac 20 mg daily. May have to increase the dose at some point in the future given that she usually was taking higher dosages but she is getting good response at this time. Restart Wellbutrin off label for ADD since can't get 2 ADDERALL daily. 150 mg daily then 300 mg daily. She can adjust the dose between 150 mg and 300 mg daily to get the optimal effect.  We discussed side effects in detail including the potential for additive side effects with the stimulant.  Historically fluoxetine was more effective than other antidepressants.  She was last taking 60 mg a day but she may not need this much since she has been off of it for several months and she is not drinking now.  She may need to increase fluoxetine at follow-up.  No  further prescriptions for benzodiazepines.  She agrees.  FU 3 mos  Meredith Staggers, MD, DFAPA     Please see After Visit Summary for patient specific instructions.  Future Appointments  Date Time Provider Department Center  01/14/2020 11:30 AM [facility] 3 [facility] None  01/14/2020 12:00 PM Johney Maine, MD [facility] None  03/17/2020 11:00 AM Cottle, Steva Ready., MD [facility] None    No orders of the defined types were placed in this encounter.     -------------------------------

## 2020-01-11 ENCOUNTER — Other Ambulatory Visit: Payer: Self-pay | Admitting: Psychiatry

## 2020-01-11 DIAGNOSIS — F331 Major depressive disorder, recurrent, moderate: Secondary | ICD-10-CM

## 2020-01-14 ENCOUNTER — Inpatient Hospital Stay: Payer: 59 | Attending: Hematology

## 2020-01-14 ENCOUNTER — Inpatient Hospital Stay: Payer: 59 | Admitting: Hematology

## 2020-01-14 DIAGNOSIS — K449 Diaphragmatic hernia without obstruction or gangrene: Secondary | ICD-10-CM | POA: Insufficient documentation

## 2020-01-14 DIAGNOSIS — F418 Other specified anxiety disorders: Secondary | ICD-10-CM | POA: Insufficient documentation

## 2020-01-14 DIAGNOSIS — Z79899 Other long term (current) drug therapy: Secondary | ICD-10-CM | POA: Insufficient documentation

## 2020-01-14 DIAGNOSIS — D509 Iron deficiency anemia, unspecified: Secondary | ICD-10-CM | POA: Insufficient documentation

## 2020-01-14 DIAGNOSIS — Z888 Allergy status to other drugs, medicaments and biological substances status: Secondary | ICD-10-CM | POA: Insufficient documentation

## 2020-01-14 DIAGNOSIS — M1990 Unspecified osteoarthritis, unspecified site: Secondary | ICD-10-CM | POA: Insufficient documentation

## 2020-01-14 DIAGNOSIS — F909 Attention-deficit hyperactivity disorder, unspecified type: Secondary | ICD-10-CM | POA: Insufficient documentation

## 2020-01-14 DIAGNOSIS — Z87891 Personal history of nicotine dependence: Secondary | ICD-10-CM | POA: Insufficient documentation

## 2020-01-16 ENCOUNTER — Ambulatory Visit: Payer: 59 | Attending: Internal Medicine

## 2020-01-16 DIAGNOSIS — Z23 Encounter for immunization: Secondary | ICD-10-CM

## 2020-01-16 NOTE — Progress Notes (Signed)
   Covid-19 Vaccination Clinic  Name:  Laura Chang    MRN: [id number] DOB: 1956-02-11  01/16/2020  Laura Chang was observed post Covid-19 immunization for 15 minutes without incident. She was provided with Vaccine Information Sheet and instruction to access the V-Safe system.   Laura Chang was instructed to call 911 with any severe reactions post vaccine: Marland Kitchen Difficulty breathing  . Swelling of face and throat  . A fast heartbeat  . A bad rash all over body  . Dizziness and weakness   Immunizations Administered    Name Date Dose VIS Date Route   Pfizer COVID-19 Vaccine 01/16/2020  4:09 PM 0.3 mL 10/23/2019 Intramuscular   Manufacturer: [employer], [employer]   Lot: [id number]   NDC: [address]-6

## 2020-01-20 ENCOUNTER — Telehealth: Payer: Self-pay | Admitting: Hematology

## 2020-01-20 ENCOUNTER — Telehealth: Payer: Self-pay

## 2020-01-20 NOTE — Telephone Encounter (Signed)
Received message from after hours service that patient would like to reschedule appts.  TCT patient, she requested her missed Lab and MD appts on 01/14/20 be rescheduled.   Scheduling message sent.

## 2020-01-20 NOTE — Telephone Encounter (Signed)
Scheduled appt per 3/10 sch message - pt aware of appt date and time   

## 2020-01-29 ENCOUNTER — Inpatient Hospital Stay: Payer: 59

## 2020-01-29 ENCOUNTER — Inpatient Hospital Stay (HOSPITAL_BASED_OUTPATIENT_CLINIC_OR_DEPARTMENT_OTHER): Payer: 59 | Admitting: Hematology

## 2020-01-29 ENCOUNTER — Other Ambulatory Visit: Payer: Self-pay

## 2020-01-29 VITALS — BP 152/96 | HR 66 | Temp 98.0°F | Resp 18 | Ht 65.0 in | Wt 167.5 lb

## 2020-01-29 DIAGNOSIS — Z79899 Other long term (current) drug therapy: Secondary | ICD-10-CM | POA: Diagnosis not present

## 2020-01-29 DIAGNOSIS — F909 Attention-deficit hyperactivity disorder, unspecified type: Secondary | ICD-10-CM | POA: Diagnosis not present

## 2020-01-29 DIAGNOSIS — D500 Iron deficiency anemia secondary to blood loss (chronic): Secondary | ICD-10-CM | POA: Diagnosis not present

## 2020-01-29 DIAGNOSIS — D509 Iron deficiency anemia, unspecified: Secondary | ICD-10-CM | POA: Diagnosis not present

## 2020-01-29 DIAGNOSIS — F418 Other specified anxiety disorders: Secondary | ICD-10-CM | POA: Diagnosis not present

## 2020-01-29 DIAGNOSIS — Z87891 Personal history of nicotine dependence: Secondary | ICD-10-CM | POA: Diagnosis not present

## 2020-01-29 DIAGNOSIS — M1990 Unspecified osteoarthritis, unspecified site: Secondary | ICD-10-CM | POA: Diagnosis not present

## 2020-01-29 DIAGNOSIS — Z888 Allergy status to other drugs, medicaments and biological substances status: Secondary | ICD-10-CM | POA: Diagnosis not present

## 2020-01-29 DIAGNOSIS — D508 Other iron deficiency anemias: Secondary | ICD-10-CM

## 2020-01-29 DIAGNOSIS — K449 Diaphragmatic hernia without obstruction or gangrene: Secondary | ICD-10-CM | POA: Diagnosis not present

## 2020-01-29 LAB — CBC WITH DIFFERENTIAL/PLATELET
Abs Immature Granulocytes: 0.01 10*3/uL (ref 0.00–0.07)
Basophils Absolute: 0 10*3/uL (ref 0.0–0.1)
Basophils Relative: 1 %
Eosinophils Absolute: 0.1 10*3/uL (ref 0.0–0.5)
Eosinophils Relative: 2 %
HCT: 36.1 % (ref 36.0–46.0)
Hemoglobin: 10.9 g/dL — ABNORMAL LOW (ref 12.0–15.0)
Immature Granulocytes: 0 %
Lymphocytes Relative: 28 %
Lymphs Abs: 1.3 10*3/uL (ref 0.7–4.0)
MCH: 23.9 pg — ABNORMAL LOW (ref 26.0–34.0)
MCHC: 30.2 g/dL (ref 30.0–36.0)
MCV: 79 fL — ABNORMAL LOW (ref 80.0–100.0)
Monocytes Absolute: 0.5 10*3/uL (ref 0.1–1.0)
Monocytes Relative: 10 %
Neutro Abs: 2.7 10*3/uL (ref 1.7–7.7)
Neutrophils Relative %: 59 %
Platelets: 333 10*3/uL (ref 150–400)
RBC: 4.57 MIL/uL (ref 3.87–5.11)
RDW: 17.6 % — ABNORMAL HIGH (ref 11.5–15.5)
WBC: 4.5 10*3/uL (ref 4.0–10.5)
nRBC: 0 % (ref 0.0–0.2)

## 2020-01-29 LAB — IRON AND TIBC
Iron: 47 ug/dL (ref 41–142)
Saturation Ratios: 11 % — ABNORMAL LOW (ref 21–57)
TIBC: 415 ug/dL (ref 236–444)
UIBC: 368 ug/dL (ref 120–384)

## 2020-01-29 LAB — FERRITIN: Ferritin: 6 ng/mL — ABNORMAL LOW (ref 11–307)

## 2020-01-29 NOTE — Progress Notes (Signed)
HEMATOLOGY/ONCOLOGY CLINIC NOTE  Date of Service: 01/29/2020  Patient Care Team: Su Ley as PCP - General (Physician Assistant) Parke Poisson, MD as PCP - Cardiology (Cardiology)  CHIEF COMPLAINTS/PURPOSE OF CONSULTATION:  Iron deficiency anemia  HISTORY OF PRESENTING ILLNESS:   Laura Chang is a  64 y.o. female who has been referred to [place] by Dr. Deatra James for evaluation and management of Iron deficiency anemia. The pt reports that she is doing well overall.  The pt reports that she has had anemia for more than 5 years. She has recently seen Dr. Jerl Santos for considerations of a right hip replacement.  The pt notes that she began Meloxicam and 600mg  Ibuprofen in August 2019 after a peridontal procedure. She then developed "out of the blue" extreme fatigue, which she notes was similar to a previous time when her HGB was in the 4s, a few years ago. She then stopped taking NSAIDs, established care with PCP Dr. September 2019, and was referred to GI Dr. Wynelle Link.   She notes that she then had an endoscopy and colonoscopy which was significant for a large hiatal hernia and ulcerations at the gastroesophageal junction. She then began 325mg  Ferrous Sulfate in November 2019.   The pt also notes that she contracted a GI virus and developed significant diarrhea within the last month.   The pt notes that she has had blood transfusions and received an IV Iron transfusion once in the past without complication. The pt notes that she has felt recently tired. She denies abdominal pains or leg swelling.  The pt notes that she was consuming "lots of alcohol 3-4 years ago." She notes that she has decreased her consumption to about 8-14 drinks of alcohol per week currently.   Most recent lab results (12/12/18) of CBC w/diff and BMP is as follows: all values are WNL except for RBC at 3.39, HGB at 7.5, HCT at 27.4, MCH at 22.1, MCHC at 27.4, PLT at 419k, Glucose at 102.  On review of systems, pt  reports recent concern for infection, lower energy levels, and denies pain along the spine, abdominal pains, leg swelling, and any other symptoms.  On PMHx the pt reports Iron deficiency anemia, ADD. On Social Hx the pt reports quit smoking cigarettes in 1980s, Consumes 8-14 drinks of alcohol per week. On Family Hx the pt reports father with heart attack at 50 years old.   INTERVAL HISTORY:   Laura Chang returns today for management and evaluation of her Iron deficiency anemia. The patient's last visit with 55 was on 07/17/2019. The pt reports that she is doing well overall.  The pt reports she is doing good. She has been taking her iron supplement regularly. She has not been drinking alcohol. Pt has been sleeping well, has high energy, her depression has not been an issue. She did have her hip replacement.   Lab results today (01/29/20) of CBC w/diff and CMP is as follows: all values are WNL except for Hemoglobin at 10.9, MCV at 79, MCH at 23.9, RDW at 17.6. 01/29/20 of Iron and TIBC: all values are WNL except: Saturation Ratio at 11 01/29/20 of Ferritin at 6  On review of systems, pt reports sleeping well, high energy and denies black/blood in stool, abdominal pain, and any other symptoms.    MEDICAL HISTORY:  Past Medical History:  Diagnosis Date  . ADHD   . Anemia   . Anxiety   . Arthritis   . Depression   .  Heart murmur    i went to see a cardiologit slast eyar  and i had zero plaque,   . PONV (postoperative nausea and vomiting)   . Recovering alcoholic in remission [facility])     SURGICAL HISTORY: Past Surgical History:  Procedure Laterality Date  . CHOLECYSTECTOMY     1993  . COLONOSCOPY  10/25/2012   Procedure: COLONOSCOPY;  Surgeon: Barrie Folk, MD;  Location[facility] ENDOSCOPY;  Service: Endoscopy;  Laterality: N/A;  . COLONOSCOPY  2019  . ESOPHAGOGASTRODUODENOSCOPY  10/24/2012   Procedure: ESOPHAGOGASTRODUODENOSCOPY (EGD);  Surgeon: Vertell Novak., MD;  Location:  [facility] ENDOSCOPY;  Service: Endoscopy;  Laterality: N/A;  . TOTAL HIP ARTHROPLASTY Right 09/01/2019   Procedure: RIGHT TOTAL HIP ARTHROPLASTY ANTERIOR APPROACH;  Surgeon: Marcene Corning, MD;  Location: [facility] ORS;  Service: Orthopedics;  Laterality: Right;  . UPPER GI ENDOSCOPY  2019    SOCIAL HISTORY: Social History   Socioeconomic History  . Marital status: Married    Spouse name: Not on file  . Number of children: Not on file  . Years of education: Not on file  . Highest education level: Not on file  Occupational History  . Not on file  Tobacco Use  . Smoking status: Former Smoker    Quit date: 08/16/2003    Years since quitting: 16.4  . Smokeless tobacco: Never Used  . Tobacco comment:  08-28-2019 "i smoked 2 cigarettes in the last month since my father  passed"  Substance and Sexual Activity  . Alcohol use: Yes    Alcohol/week: 10.0 standard drinks    Types: 10 Glasses of wine per week  . Drug use: No  . Sexual activity: Not on file  Other Topics Concern  . Not on file  Social History Narrative  . Not on file   Social Determinants of Health   Financial Resource Strain:   . Difficulty of Paying Living Expenses:   Food Insecurity:   . Worried About Programme researcher, broadcasting/film/video in the Last Year:   . Barista in the Last Year:   Transportation Needs:   . Freight forwarder (Medical):   Marland Kitchen Lack of Transportation (Non-Medical):   Physical Activity:   . Days of Exercise per Week:   . Minutes of Exercise per Session:   Stress:   . Feeling of Stress :   Social Connections:   . Frequency of Communication with Friends and Family:   . Frequency of Social Gatherings with Friends and Family:   . Attends Religious Services:   . Active Member of Clubs or Organizations:   . Attends Banker Meetings:   Marland Kitchen Marital Status:   Intimate Partner Violence:   . Fear of Current or Ex-Partner:   . Emotionally Abused:   Marland Kitchen Physically Abused:   . Sexually Abused:     FAMILY  HISTORY: Family History  Problem Relation Age of Onset  . Atrial fibrillation Mother   . CAD Father     ALLERGIES:  is allergic to metronidazole and ferrlecit [na ferric gluc cplx in sucrose].  MEDICATIONS:  Current Outpatient Medications  Medication Sig Dispense Refill  . amphetamine-dextroamphetamine (ADDERALL XR) 20 MG 24 hr capsule Take 1 capsule (20 mg total) by mouth daily. 30 capsule 0  . amphetamine-dextroamphetamine (ADDERALL XR) 20 MG 24 hr capsule Take 1 capsule (20 mg total) by mouth daily. 30 capsule 0  . [START ON 02/11/2020] amphetamine-dextroamphetamine (ADDERALL XR) 20 MG 24 hr capsule Take  1 capsule (20 mg total) by mouth daily. 30 capsule 0  . buPROPion (WELLBUTRIN XL) 150 MG 24 hr tablet TAKE 3 TABLETS (450 MG TOTAL) BY MOUTH DAILY. 270 tablet 1  . FLUoxetine (PROZAC) 20 MG capsule Take 1 capsule (20 mg total) by mouth daily. 90 capsule 0  . iron polysaccharides (NIFEREX) 150 MG capsule Take 150 mg by mouth daily.    . traZODone (DESYREL) 50 MG tablet Take 1 tablet (50 mg total) by mouth at bedtime as needed for sleep. 90 tablet 0   No current facility-administered medications for this visit.    REVIEW OF SYSTEMS:   A 10+ POINT REVIEW OF SYSTEMS WAS OBTAINED including neurology, dermatology, psychiatry, cardiac, respiratory, lymph, extremities, GI, GU, Musculoskeletal, constitutional, breasts, reproductive, HEENT.  All pertinent positives are noted in the HPI.  All others are negative.    PHYSICAL EXAMINATION:  . Vitals:   01/29/20 0922  BP: (!) 152/96  Pulse: 66  Resp: 18  Temp: 98 F (36.7 C)  SpO2: 100%   Filed Weights   01/29/20 0922  Weight: 167 lb 8 oz (76 kg)   Body mass index is 27.87 kg/m.   GENERAL:alert, in no acute distress and comfortable SKIN: no acute rashes, no significant lesions EYES: conjunctiva are pink and non-injected, sclera anicteric OROPHARYNX: MMM, no exudates, no oropharyngeal erythema or ulceration NECK: supple, no  JVD LYMPH:  no palpable lymphadenopathy in the cervical, axillary or inguinal regions LUNGS: clear to auscultation b/l with normal respiratory effort HEART: regular rate & rhythm ABDOMEN:  normoactive bowel sounds , non tender, not distended. Extremity: no pedal edema PSYCH: alert & oriented x 3 with fluent speech NEURO: no focal motor/sensory deficits    LABORATORY DATA:  I have reviewed the data as listed  . CBC Latest Ref Rng & Units 01/29/2020 09/02/2019 08/28/2019  WBC 4.0 - 10.5 K/uL 4.5 8.5 3.5(L)  Hemoglobin 12.0 - 15.0 g/dL 10.9(L) 9.6(L) 11.6(L)  Hematocrit 36.0 - 46.0 % 36.1 28.8(L) 34.1(L)  Platelets 150 - 400 K/uL 333 212 276    . CMP Latest Ref Rng & Units 09/02/2019 08/28/2019 01/27/2019  Glucose 70 - 99 mg/dL 706(C) 376(E) 831(D)  BUN 8 - 23 mg/dL 15 18 16   Creatinine 0.44 - 1.00 mg/dL 1.76 1.60  Sodium 135 - 145 mmol/L 134(L) 139 142  Potassium 3.5 - 5.1 mmol/L 3.7 4.4 4.1  Chloride 98 - 111 mmol/L 105 105 109  CO2 22 - 32 mmol/L 20(L) 25 20(L)  Calcium 8.9 - 10.3 mg/dL 8.2(L) 9.4 9.3  Total Protein 6.5 - 8.1 g/dL - - 7.1  Total Bilirubin 0.3 - 1.2 mg/dL - - 0.5  Alkaline Phos 38 - 126 U/L - - 54  AST 15 - 41 U/L - - 14(L)  ALT 0 - 44 U/L - - 14    . Lab Results  Component Value Date   IRON 47 01/29/2020   TIBC 415 01/29/2020   IRONPCTSAT 11 (L) 01/29/2020   (Iron and TIBC)  Lab Results  Component Value Date   FERRITIN 6 (L) 01/29/2020   . Lab Results  Component Value Date   IRON 80 07/17/2019   TIBC 348 07/17/2019   IRONPCTSAT 23 07/17/2019   (Iron and TIBC)  Lab Results  Component Value Date   FERRITIN 19 07/17/2019     RADIOGRAPHIC STUDIES: I have personally reviewed the radiological images as listed and agreed with the findings in the report. No results found.  ASSESSMENT &  PLAN:   64 y.o. female with  1. Iron deficiency anemia Recurrent  Likely from ongoing GI bleeding probably from her significant hiatal hernia and  cameron ulcerations + NSAID use + ETOH abuse. Frequent NSAID use +Prozac an additional risk factors in addition to [place] upon initial presentation from 12/12/18, HGB at 7.5 with MCV at 80.8. Normal creatinine Additional element of blood loss from her orthopedic surgery.  2. Reaction to IV Ferrelecit  PLAN -Discussed pt labwork today, 01/29/20; of CBC w/diff and CMP is as follows: all values are WNL except for Hemoglobin at 10.9, MCV at 79, MCH at 23.9, RDW at 17.6. -Discussed 01/29/20 of Iron and TIBC: all values are WNL except: Saturation Ratio at 11 -Discussed 01/29/20 of Ferritin at 6 -Pt prefers to complete her planned IV Iron infusions- depending on ferritin  -Would recommend PO Iron polysaccharide 150mg  po Daily  FOLLOW UP: Plz schedule for IV injectafer weekly x 2 doses RTC with Dr Irene Limbo with labs in 6 months   The total time spent in the appt was 20 minutes and more than 50% was on counseling and direct patient cares.  All of the patient's questions were answered with apparent satisfaction. The patient knows to call the clinic with any problems, questions or concerns.    Sullivan Lone MD [place] AAHIVMS [facility] Hematology/Oncology Physician [facility]  (Office):       [phone] (Work cell):  [phone] (Fax):           [phone]  01/29/2020 8:16 AM  I, Dawayne Cirri am acting as a Education administrator for Dr. Sullivan Lone.   .I have reviewed the above documentation for accuracy and completeness, and I agree with the above. Brunetta Genera MD

## 2020-02-01 ENCOUNTER — Telehealth: Payer: Self-pay | Admitting: Hematology

## 2020-02-01 NOTE — Telephone Encounter (Signed)
Scheduled appt per 3/21 sch message - pt aware of appt date and time   

## 2020-02-03 ENCOUNTER — Other Ambulatory Visit: Payer: Self-pay

## 2020-02-03 ENCOUNTER — Inpatient Hospital Stay: Payer: 59

## 2020-02-03 VITALS — BP 150/99 | HR 63 | Temp 98.3°F | Resp 16

## 2020-02-03 DIAGNOSIS — D508 Other iron deficiency anemias: Secondary | ICD-10-CM

## 2020-02-03 DIAGNOSIS — D509 Iron deficiency anemia, unspecified: Secondary | ICD-10-CM | POA: Diagnosis not present

## 2020-02-03 MED ORDER — DIPHENHYDRAMINE HCL 50 MG/ML IJ SOLN
INTRAMUSCULAR | Status: AC
  Filled 2020-02-03: qty 1

## 2020-02-03 MED ORDER — ACETAMINOPHEN 325 MG PO TABS
ORAL_TABLET | ORAL | Status: AC
  Filled 2020-02-03: qty 2

## 2020-02-03 MED ORDER — SODIUM CHLORIDE 0.9 % IV SOLN
Freq: Once | INTRAVENOUS | Status: AC
  Filled 2020-02-03: qty 250

## 2020-02-03 MED ORDER — METHYLPREDNISOLONE SODIUM SUCC 125 MG IJ SOLR
80.0000 mg | Freq: Once | INTRAMUSCULAR | Status: AC
  Administered 2020-02-03: 80 mg via INTRAVENOUS

## 2020-02-03 MED ORDER — FAMOTIDINE IN NACL 20-0.9 MG/50ML-% IV SOLN
20.0000 mg | Freq: Once | INTRAVENOUS | Status: AC
  Administered 2020-02-03: 20 mg via INTRAVENOUS

## 2020-02-03 MED ORDER — METHYLPREDNISOLONE SODIUM SUCC 125 MG IJ SOLR
INTRAMUSCULAR | Status: AC
  Filled 2020-02-03: qty 2

## 2020-02-03 MED ORDER — ACETAMINOPHEN 325 MG PO TABS
650.0000 mg | ORAL_TABLET | Freq: Once | ORAL | Status: AC
  Administered 2020-02-03: 650 mg via ORAL

## 2020-02-03 MED ORDER — SODIUM CHLORIDE 0.9 % IV SOLN
200.0000 mg | Freq: Once | INTRAVENOUS | Status: AC
  Administered 2020-02-03: 200 mg via INTRAVENOUS
  Filled 2020-02-03: qty 200

## 2020-02-03 MED ORDER — DIPHENHYDRAMINE HCL 50 MG/ML IJ SOLN
25.0000 mg | Freq: Once | INTRAMUSCULAR | Status: AC
  Administered 2020-02-03: 25 mg via INTRAVENOUS

## 2020-02-03 MED ORDER — FAMOTIDINE IN NACL 20-0.9 MG/50ML-% IV SOLN
INTRAVENOUS | Status: AC
  Filled 2020-02-03: qty 50

## 2020-02-03 NOTE — Patient Instructions (Signed)

## 2020-02-03 NOTE — Progress Notes (Signed)
Intravenous Iron Formulation Change  Ms. Jorden has insurance that requires a change in intravenous iron product from [facility] to alternative product. She previously had a reaction to Ferrlecit. She has received Injectafer and iron dextran in the past without noted allergies.   Spoke with Dr. Candise Che. Proceed with venofer today as per previous orders along with premedications. Additional appointments will be scheduled pending patient tolerability.   The plan for iron therapy is as follows:  Venofer 200mg  IV over 30 minutes weekly x4 infusions Premedications: methylprednisolone 80mg  IV, famotidine 20mg  IV, acetaminophen 650mg  PO, and diphenhydramine 25mg  IV.   , PharmD, BCPS, BCOP Oncology Pharmacist Pharmacy Phone: [phone] 02/03/2020

## 2020-02-06 ENCOUNTER — Ambulatory Visit: Payer: 59 | Attending: Internal Medicine

## 2020-02-06 DIAGNOSIS — Z23 Encounter for immunization: Secondary | ICD-10-CM

## 2020-02-06 NOTE — Progress Notes (Signed)
   Covid-19 Vaccination Clinic  Name:  Laura Chang    MRN: [id number] DOB: [birth year]  02/06/2020  [name] was observed post Covid-19 immunization for 15 minutes without incident. She was provided with Vaccine Information Sheet and instruction to access the V-Safe system.   [name] was instructed to call 911 with any severe reactions post vaccine: Marland Kitchen Difficulty breathing  . Swelling of face and throat  . A fast heartbeat  . A bad rash all over body  . Dizziness and weakness   Immunizations Administered    Name Date Dose VIS Date Route   Pfizer COVID-19 Vaccine 02/06/2020  3:03 PM 0.3 mL 10/23/2019 Intramuscular   Manufacturer: [employer], [employer]   Lot: [id number]   NDC: [address]-4

## 2020-02-10 ENCOUNTER — Inpatient Hospital Stay: Payer: 59

## 2020-02-10 ENCOUNTER — Other Ambulatory Visit: Payer: Self-pay

## 2020-02-10 VITALS — BP 142/89 | HR 59 | Temp 98.2°F | Resp 16

## 2020-02-10 DIAGNOSIS — D509 Iron deficiency anemia, unspecified: Secondary | ICD-10-CM | POA: Diagnosis not present

## 2020-02-10 DIAGNOSIS — D508 Other iron deficiency anemias: Secondary | ICD-10-CM

## 2020-02-10 MED ORDER — ACETAMINOPHEN 325 MG PO TABS
650.0000 mg | ORAL_TABLET | Freq: Once | ORAL | Status: AC
  Administered 2020-02-10: 650 mg via ORAL

## 2020-02-10 MED ORDER — DIPHENHYDRAMINE HCL 50 MG/ML IJ SOLN
INTRAMUSCULAR | Status: AC
  Filled 2020-02-10: qty 1

## 2020-02-10 MED ORDER — METHYLPREDNISOLONE SODIUM SUCC 125 MG IJ SOLR
80.0000 mg | Freq: Once | INTRAMUSCULAR | Status: AC
  Administered 2020-02-10: 80 mg via INTRAVENOUS

## 2020-02-10 MED ORDER — ACETAMINOPHEN 325 MG PO TABS
ORAL_TABLET | ORAL | Status: AC
  Filled 2020-02-10: qty 2

## 2020-02-10 MED ORDER — DIPHENHYDRAMINE HCL 50 MG/ML IJ SOLN
25.0000 mg | Freq: Once | INTRAMUSCULAR | Status: AC
  Administered 2020-02-10: 25 mg via INTRAVENOUS

## 2020-02-10 MED ORDER — METHYLPREDNISOLONE SODIUM SUCC 125 MG IJ SOLR
INTRAMUSCULAR | Status: AC
  Filled 2020-02-10: qty 2

## 2020-02-10 MED ORDER — FAMOTIDINE IN NACL 20-0.9 MG/50ML-% IV SOLN
20.0000 mg | Freq: Once | INTRAVENOUS | Status: AC
  Administered 2020-02-10: 20 mg via INTRAVENOUS

## 2020-02-10 MED ORDER — FAMOTIDINE IN NACL 20-0.9 MG/50ML-% IV SOLN
INTRAVENOUS | Status: AC
  Filled 2020-02-10: qty 50

## 2020-02-10 MED ORDER — SODIUM CHLORIDE 0.9 % IV SOLN
200.0000 mg | Freq: Once | INTRAVENOUS | Status: AC
  Administered 2020-02-10: 200 mg via INTRAVENOUS
  Filled 2020-02-10: qty 200

## 2020-02-10 MED ORDER — SODIUM CHLORIDE 0.9 % IV SOLN
Freq: Once | INTRAVENOUS | Status: AC
  Filled 2020-02-10: qty 250

## 2020-02-10 NOTE — Patient Instructions (Signed)

## 2020-02-17 ENCOUNTER — Other Ambulatory Visit: Payer: Self-pay

## 2020-02-17 ENCOUNTER — Inpatient Hospital Stay: Payer: 59 | Attending: Hematology

## 2020-02-17 VITALS — BP 126/79 | HR 65 | Temp 98.2°F | Resp 16

## 2020-02-17 DIAGNOSIS — D508 Other iron deficiency anemias: Secondary | ICD-10-CM

## 2020-02-17 DIAGNOSIS — D509 Iron deficiency anemia, unspecified: Secondary | ICD-10-CM | POA: Diagnosis not present

## 2020-02-17 MED ORDER — FAMOTIDINE IN NACL 20-0.9 MG/50ML-% IV SOLN
INTRAVENOUS | Status: AC
  Filled 2020-02-17: qty 50

## 2020-02-17 MED ORDER — SODIUM CHLORIDE 0.9 % IV SOLN
Freq: Once | INTRAVENOUS | Status: AC
  Filled 2020-02-17: qty 250

## 2020-02-17 MED ORDER — METHYLPREDNISOLONE SODIUM SUCC 125 MG IJ SOLR
80.0000 mg | Freq: Once | INTRAMUSCULAR | Status: AC
  Administered 2020-02-17: 80 mg via INTRAVENOUS

## 2020-02-17 MED ORDER — FAMOTIDINE IN NACL 20-0.9 MG/50ML-% IV SOLN
20.0000 mg | Freq: Once | INTRAVENOUS | Status: AC
  Administered 2020-02-17: 20 mg via INTRAVENOUS

## 2020-02-17 MED ORDER — SODIUM CHLORIDE 0.9 % IV SOLN
200.0000 mg | Freq: Once | INTRAVENOUS | Status: AC
  Administered 2020-02-17: 200 mg via INTRAVENOUS
  Filled 2020-02-17: qty 10

## 2020-02-17 MED ORDER — ACETAMINOPHEN 325 MG PO TABS
ORAL_TABLET | ORAL | Status: AC
  Filled 2020-02-17: qty 2

## 2020-02-17 MED ORDER — DIPHENHYDRAMINE HCL 50 MG/ML IJ SOLN
25.0000 mg | Freq: Once | INTRAMUSCULAR | Status: AC
  Administered 2020-02-17: 25 mg via INTRAVENOUS

## 2020-02-17 MED ORDER — DIPHENHYDRAMINE HCL 50 MG/ML IJ SOLN
INTRAMUSCULAR | Status: AC
  Filled 2020-02-17: qty 1

## 2020-02-17 MED ORDER — METHYLPREDNISOLONE SODIUM SUCC 125 MG IJ SOLR
INTRAMUSCULAR | Status: AC
  Filled 2020-02-17: qty 2

## 2020-02-17 MED ORDER — ACETAMINOPHEN 325 MG PO TABS
650.0000 mg | ORAL_TABLET | Freq: Once | ORAL | Status: AC
  Administered 2020-02-17: 650 mg via ORAL

## 2020-02-17 NOTE — Patient Instructions (Signed)

## 2020-02-24 ENCOUNTER — Other Ambulatory Visit: Payer: Self-pay

## 2020-02-24 ENCOUNTER — Inpatient Hospital Stay: Payer: 59

## 2020-02-24 VITALS — BP 141/93 | HR 65 | Temp 98.8°F | Resp 20

## 2020-02-24 DIAGNOSIS — D509 Iron deficiency anemia, unspecified: Secondary | ICD-10-CM | POA: Diagnosis not present

## 2020-02-24 DIAGNOSIS — D508 Other iron deficiency anemias: Secondary | ICD-10-CM

## 2020-02-24 MED ORDER — FAMOTIDINE IN NACL 20-0.9 MG/50ML-% IV SOLN
20.0000 mg | Freq: Once | INTRAVENOUS | Status: AC
  Administered 2020-02-24: 20 mg via INTRAVENOUS

## 2020-02-24 MED ORDER — SODIUM CHLORIDE 0.9 % IV SOLN
Freq: Once | INTRAVENOUS | Status: AC
  Filled 2020-02-24: qty 250

## 2020-02-24 MED ORDER — ACETAMINOPHEN 325 MG PO TABS
650.0000 mg | ORAL_TABLET | Freq: Once | ORAL | Status: AC
  Administered 2020-02-24: 650 mg via ORAL

## 2020-02-24 MED ORDER — SODIUM CHLORIDE 0.9 % IV SOLN
200.0000 mg | Freq: Once | INTRAVENOUS | Status: AC
  Administered 2020-02-24: 200 mg via INTRAVENOUS
  Filled 2020-02-24: qty 200

## 2020-02-24 MED ORDER — METHYLPREDNISOLONE SODIUM SUCC 125 MG IJ SOLR
80.0000 mg | Freq: Once | INTRAMUSCULAR | Status: AC
  Administered 2020-02-24: 80 mg via INTRAVENOUS

## 2020-02-24 MED ORDER — DIPHENHYDRAMINE HCL 50 MG/ML IJ SOLN
25.0000 mg | Freq: Once | INTRAMUSCULAR | Status: AC
  Administered 2020-02-24: 25 mg via INTRAVENOUS

## 2020-02-24 MED ORDER — DIPHENHYDRAMINE HCL 50 MG/ML IJ SOLN
INTRAMUSCULAR | Status: AC
  Filled 2020-02-24: qty 1

## 2020-02-24 MED ORDER — FAMOTIDINE IN NACL 20-0.9 MG/50ML-% IV SOLN
INTRAVENOUS | Status: AC
  Filled 2020-02-24: qty 50

## 2020-02-24 MED ORDER — METHYLPREDNISOLONE SODIUM SUCC 125 MG IJ SOLR
INTRAMUSCULAR | Status: AC
  Filled 2020-02-24: qty 2

## 2020-02-24 MED ORDER — ACETAMINOPHEN 325 MG PO TABS
ORAL_TABLET | ORAL | Status: AC
  Filled 2020-02-24: qty 2

## 2020-02-24 NOTE — Patient Instructions (Signed)
Iron Sucrose injection What is this medicine? IRON SUCROSE (AHY ern SOO krohs) is an iron complex. Iron is used to make healthy red blood cells, which carry oxygen and nutrients throughout the body. This medicine is used to treat iron deficiency anemia in people with chronic kidney disease. This medicine may be used for other purposes; ask your health care provider or pharmacist if you have questions. COMMON BRAND NAME(S): Venofer What should I tell my health care provider before I take this medicine? They need to know if you have any of these conditions:  anemia not caused by low iron levels  heart disease  high levels of iron in the blood  kidney disease  liver disease  an unusual or allergic reaction to iron, other medicines, foods, dyes, or preservatives  pregnant or trying to get pregnant  breast-feeding How should I use this medicine? This medicine is for infusion into a vein. It is given by a health care professional in a hospital or clinic setting. Talk to your pediatrician regarding the use of this medicine in children. While this drug may be prescribed for children as young as 2 years for selected conditions, precautions do apply. Overdosage: If you think you have taken too much of this medicine contact a poison control center or emergency room at once. NOTE: This medicine is only for you. Do not share this medicine with others. What if I miss a dose? It is important not to miss your dose. Call your doctor or health care professional if you are unable to keep an appointment. What may interact with this medicine? Do not take this medicine with any of the following medications:  deferoxamine  dimercaprol  other iron products This medicine may also interact with the following medications:  chloramphenicol  deferasirox This list may not describe all possible interactions. Give your health care provider a list of all the medicines, herbs, non-prescription drugs, or  dietary supplements you use. Also tell them if you smoke, drink alcohol, or use illegal drugs. Some items may interact with your medicine. What should I watch for while using this medicine? Visit your doctor or healthcare professional regularly. Tell your doctor or healthcare professional if your symptoms do not start to get better or if they get worse. You may need blood work done while you are taking this medicine. You may need to follow a special diet. Talk to your doctor. Foods that contain iron include: whole grains/cereals, dried fruits, beans, or peas, leafy green vegetables, and organ meats (liver, kidney). What side effects may I notice from receiving this medicine? Side effects that you should report to your doctor or health care professional as soon as possible:  allergic reactions like skin rash, itching or hives, swelling of the face, lips, or tongue  breathing problems  changes in blood pressure  cough  fast, irregular heartbeat  feeling faint or lightheaded, falls  fever or chills  flushing, sweating, or hot feelings  joint or muscle aches/pains  seizures  swelling of the ankles or feet  unusually weak or tired Side effects that usually do not require medical attention (report to your doctor or health care professional if they continue or are bothersome):  diarrhea  feeling achy  headache  irritation at site where injected  nausea, vomiting  stomach upset  tiredness This list may not describe all possible side effects. Call your doctor for medical advice about side effects. You may report side effects to FDA at 1-800-FDA-1088. Where should I keep   my medicine? This drug is given in a hospital or clinic and will not be stored at home. NOTE: This sheet is a summary. It may not cover all possible information. If you have questions about this medicine, talk to your doctor, pharmacist, or health care provider.  2020 Elsevier/Gold Standard (2011-08-09  17:14:35) Coronavirus (COVID-19) Are you at risk?  Are you at risk for the Coronavirus (COVID-19)?  To be considered HIGH RISK for Coronavirus (COVID-19), you have to meet the following criteria:  . Traveled to [place], [place], [place], [place] or [place]; or in the [place] to [place], [place], [place], or [place]; and have fever, cough, and shortness of breath within the last 2 weeks of travel OR . Been in close contact with a person diagnosed with COVID-19 within the last 2 weeks and have fever, cough, and shortness of breath . IF YOU DO NOT MEET THESE CRITERIA, YOU ARE CONSIDERED LOW RISK FOR COVID-19.  What to do if you are HIGH RISK for COVID-19?  . If you are having a medical emergency, call 911. . Seek medical care right away. Before you go to a doctor's office, urgent care or emergency department, call ahead and tell them about your recent travel, contact with someone diagnosed with COVID-19, and your symptoms. You should receive instructions from your physician's office regarding next steps of care.  . When you arrive at healthcare provider, tell the healthcare staff immediately you have returned from visiting [place], [place], [place], [place] or [place]; or traveled in the [place] to [place], [place], [place], or [place]; in the last two weeks or you have been in close contact with a person diagnosed with COVID-19 in the last 2 weeks.   . Tell the health care staff about your symptoms: fever, cough and shortness of breath. . After you have been seen by a medical provider, you will be either: o Tested for (COVID-19) and discharged home on quarantine except to seek medical care if symptoms worsen, and asked to  - Stay home and avoid contact with others until you get your results (4-5 days)  - Avoid travel on public transportation if possible (such as bus, train, or airplane) or o Sent to the Emergency Department by EMS for evaluation, COVID-19 testing, and  possible admission depending on your condition and test results.  What to do if you are LOW RISK for COVID-19?  Reduce your risk of any infection by using the same precautions used for avoiding the common cold or flu:  . Wash your hands often with soap and warm water for at least 20 seconds.  If soap and water are not readily available, use an alcohol-based hand sanitizer with at least 60% alcohol.  . If coughing or sneezing, cover your mouth and nose by coughing or sneezing into the elbow areas of your shirt or coat, into a tissue or into your sleeve (not your hands). . Avoid shaking hands with others and consider head nods or verbal greetings only. . Avoid touching your eyes, nose, or mouth with unwashed hands.  . Avoid close contact with people who are sick. . Avoid places or events with large numbers of people in one location, like concerts or sporting events. . Carefully consider travel plans you have or are making. . If you are planning any travel outside or inside the [place], visit the CDC's Travelers' Health webpage for the latest health notices. . If you have some symptoms but not all   symptoms, continue to monitor at home and seek medical attention if your symptoms worsen. . If you are having a medical emergency, call 911.   ADDITIONAL HEALTHCARE OPTIONS FOR PATIENTS  [facility] / e-Visit: [email or web address]         [facility]: [phone]  [facility]: [phone]                   [facility]: [phone]  

## 2020-03-11 ENCOUNTER — Other Ambulatory Visit: Payer: Self-pay | Admitting: Psychiatry

## 2020-03-11 DIAGNOSIS — F411 Generalized anxiety disorder: Secondary | ICD-10-CM

## 2020-03-11 DIAGNOSIS — F331 Major depressive disorder, recurrent, moderate: Secondary | ICD-10-CM

## 2020-03-14 ENCOUNTER — Telehealth: Payer: Self-pay | Admitting: Psychiatry

## 2020-03-14 ENCOUNTER — Other Ambulatory Visit: Payer: Self-pay

## 2020-03-14 DIAGNOSIS — F900 Attention-deficit hyperactivity disorder, predominantly inattentive type: Secondary | ICD-10-CM

## 2020-03-14 MED ORDER — AMPHETAMINE-DEXTROAMPHET ER 20 MG PO CP24: 20.0000 mg | ORAL_CAPSULE | Freq: Every day | ORAL | 0 refills | Status: DC

## 2020-03-14 NOTE — Telephone Encounter (Signed)
Last refill 02/16/2020, next apt 03/17/2020  Pended for Dr. Jennelle Human to submit

## 2020-03-14 NOTE — Telephone Encounter (Signed)
Pt would like a refill on Adderall. Please send to [facility] on [address]. 

## 2020-03-17 ENCOUNTER — Ambulatory Visit: Payer: 59 | Admitting: Psychiatry

## 2020-04-06 ENCOUNTER — Other Ambulatory Visit: Payer: Self-pay | Admitting: Psychiatry

## 2020-04-06 DIAGNOSIS — F331 Major depressive disorder, recurrent, moderate: Secondary | ICD-10-CM

## 2020-04-06 DIAGNOSIS — F411 Generalized anxiety disorder: Secondary | ICD-10-CM

## 2020-04-12 ENCOUNTER — Other Ambulatory Visit: Payer: Self-pay

## 2020-04-12 ENCOUNTER — Telehealth: Payer: Self-pay | Admitting: Psychiatry

## 2020-04-12 DIAGNOSIS — F900 Attention-deficit hyperactivity disorder, predominantly inattentive type: Secondary | ICD-10-CM

## 2020-04-12 MED ORDER — AMPHETAMINE-DEXTROAMPHET ER 20 MG PO CP24: 20.0000 mg | ORAL_CAPSULE | Freq: Every day | ORAL | 0 refills | Status: DC

## 2020-04-12 NOTE — Telephone Encounter (Signed)
Pt called and LM 5/31 to get a RF of her Adderall. She is not sure when her next appt is supposed to be. Office tried to contact her but she has no VM avail. She needed to return in May.

## 2020-04-12 NOTE — Telephone Encounter (Signed)
Last refill 03/15/2020, pended for Dr. Jennelle Human to review and submit  Patient needs to schedule f/up apt

## 2020-05-11 ENCOUNTER — Ambulatory Visit (INDEPENDENT_AMBULATORY_CARE_PROVIDER_SITE_OTHER): Payer: 59 | Admitting: Psychiatry

## 2020-05-11 ENCOUNTER — Encounter: Payer: Self-pay | Admitting: Psychiatry

## 2020-05-11 ENCOUNTER — Other Ambulatory Visit: Payer: Self-pay

## 2020-05-11 DIAGNOSIS — F5105 Insomnia due to other mental disorder: Secondary | ICD-10-CM

## 2020-05-11 DIAGNOSIS — F331 Major depressive disorder, recurrent, moderate: Secondary | ICD-10-CM

## 2020-05-11 DIAGNOSIS — F1021 Alcohol dependence, in remission: Secondary | ICD-10-CM

## 2020-05-11 DIAGNOSIS — F900 Attention-deficit hyperactivity disorder, predominantly inattentive type: Secondary | ICD-10-CM | POA: Diagnosis not present

## 2020-05-11 DIAGNOSIS — F411 Generalized anxiety disorder: Secondary | ICD-10-CM

## 2020-05-11 MED ORDER — TRAZODONE HCL 50 MG PO TABS: 50.0000 mg | ORAL_TABLET | Freq: Every evening | ORAL | 0 refills | Status: DC | PRN

## 2020-05-11 MED ORDER — AMPHETAMINE-DEXTROAMPHET ER 20 MG PO CP24: 20.0000 mg | ORAL_CAPSULE | Freq: Every day | ORAL | 0 refills | Status: DC

## 2020-05-11 NOTE — Progress Notes (Signed)
[name] [id number] 25-Apr-1956 64 y.o.  Subjective:   Patient ID:  Laura Chang is a 64 y.o. (DOB September 29, 1956) female.  Chief Complaint:  Chief Complaint  Patient presents with  . ADHD  . Follow-up    Mood and meds    HPI Laura Chang presents to the office today for follow-up of depression and anxiety and ADD.  seen November 12, 2019.  Melted down in 2020.  Went to [facility] in July.  No withdrawal.  1 drink since then.  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 She was on no stimulant and no SSRI but was taking Strattera and Wellbutrin.  The following changes were made. Stop Strattera. OK restart stimulant bc severe ADD Restart Adderall 1 daily for a few days and if tolerated then restart 1 twice daily. If not tolerated reduce the dosage if needed. May need to stop Wellbutrin if not tolerating the stimulant.  Yes.  DC Wellbutrin Restart Prozac 20 mg daily.  February 2021 appointment with the following noted: Completed grant proposal.  Couldn't doit without Adderall.  Sold a bunch of work.   Adderall XR lasts about 3 pm.  Strength seems about right.  BP been OK.  Not jittery.   Stopped Wellbutrin but had no SE. Mood drastically better with grant proposal and back on fluoxetine.  Less depressed and lethargic.  No anxiety.  Cut back on coffee. Started back with devotions and stronger faith. Plan: Continue Prozac 20 mg daily. May have to increase the dose at some point in the future given that she usually was taking higher dosages but she is getting good response at this time. Restart Wellbutrin off label for ADD since can't get 2 ADDERALL daily. 150 mg daily then 300 mg daily. She can adjust the dose between 150 mg and 300 mg daily to get the optimal effect.   05/11/2020 appointment with the following noted: Has been inconsistent with Prozac and Wellbutrin. Not sure of the effect of Wellbutrin. Biggest deterrent in work is anxiety.  Some  of the work is conceptual and difficult at times.  Can feel she's not up to a project at times.  Overall is OK but would like a steadier benefit from stimulant.  Exhausted from managing concentration and keeping up with things from the day.  Loses things.  Not good keeping up with schedule. Overall productive and emotionally OK. Can feel Adderall wear off. Mood is better in summer and worse in the winter.   F died in 09/28/2023 and that is a loss. No SE Wellbutrin. Still attends AA meetings.  Real benefit from [facility] last year. Recognizes effect of anemia on ADD and mood.  Had iron infusions last winter.  D [place]  Past Psychiatric Medication Trials: fluoxetine, duloxetine, Viibryd, lamotrigine, Pristiq, sertraline, citalopram Adderall, Adderall XR, Vyvanse, Strattera low dose NR Lorazepam Trazodone Wellbutrin Depakote  Review of Systems:  Review of Systems  Cardiovascular: Negative for palpitations.  Musculoskeletal: Positive for arthralgias, back pain and joint swelling.       SP hip surgery October 2020  Neurological: Negative for tremors and weakness.  Psychiatric/Behavioral: Negative for agitation, behavioral problems, confusion, decreased concentration, dysphoric mood, hallucinations, self-injury, sleep disturbance and suicidal ideas. The patient is not nervous/anxious and is not hyperactive.     Medications: I have reviewed the patient's current medications.  Current Outpatient Medications  Medication Sig Dispense Refill  . amphetamine-dextroamphetamine (ADDERALL XR) 20 MG 24 hr capsule Take  1 capsule (20 mg total) by mouth daily. 30 capsule 0  . amphetamine-dextroamphetamine (ADDERALL XR) 20 MG 24 hr capsule Take 1 capsule (20 mg total) by mouth daily. 30 capsule 0  . amphetamine-dextroamphetamine (ADDERALL XR) 20 MG 24 hr capsule Take 1 capsule (20 mg total) by mouth daily. 30 capsule 0  . buPROPion (WELLBUTRIN XL) 150 MG 24 hr tablet TAKE 3 TABLETS (450 MG TOTAL)  BY MOUTH DAILY. 270 tablet 1  . FLUoxetine (PROZAC) 20 MG capsule TAKE 1 CAPSULE BY MOUTH EVERY DAY 90 capsule 1  . iron polysaccharides (NIFEREX) 150 MG capsule Take 150 mg by mouth daily.    . traZODone (DESYREL) 50 MG tablet Take 1 tablet (50 mg total) by mouth at bedtime as needed for sleep. 90 tablet 0   No current facility-administered medications for this visit.    Medication Side Effects: None  Allergies:  Allergies  Allergen Reactions  . Metronidazole Shortness Of Breath and Other (See Comments)    Heart pounding  . Ferrlecit [Na Ferric Gluc Cplx In Sucrose] Other (See Comments)    Infusion reaction 05/12/2019    Past Medical History:  Diagnosis Date  . ADHD   . Anemia   . Anxiety   . Arthritis   . Depression   . Heart murmur    i went to see a cardiologit slast eyar  and i had zero plaque,   . PONV (postoperative nausea and vomiting)   . Recovering alcoholic in remission [facility])     Family History  Problem Relation Age of Onset  . Atrial fibrillation Mother   . CAD Father     Social History   Socioeconomic History  . Marital status: Married    Spouse name: Not on file  . Number of children: Not on file  . Years of education: Not on file  . Highest education level: Not on file  Occupational History  . Not on file  Tobacco Use  . Smoking status: Former Smoker    Quit date: 08/16/2003    Years since quitting: 16.7  . Smokeless tobacco: Never Used  . Tobacco comment:  08-28-2019 "i smoked 2 cigarettes in the last month since my father  passed"  Vaping Use  . Vaping Use: Never used  Substance and Sexual Activity  . Alcohol use: Yes    Alcohol/week: 10.0 standard drinks    Types: 10 Glasses of wine per week  . Drug use: No  . Sexual activity: Not on file  Other Topics Concern  . Not on file  Social History Narrative  . Not on file   Social Determinants of Health   Financial Resource Strain:   . Difficulty of Paying Living Expenses:   Food  Insecurity:   . Worried About Programme researcher, broadcasting/film/video in the Last Year:   . Barista in the Last Year:   Transportation Needs:   . Freight forwarder (Medical):   Marland Kitchen Lack of Transportation (Non-Medical):   Physical Activity:   . Days of Exercise per Week:   . Minutes of Exercise per Session:   Stress:   . Feeling of Stress :   Social Connections:   . Frequency of Communication with Friends and Family:   . Frequency of Social Gatherings with Friends and Family:   . Attends Religious Services:   . Active Member of Clubs or Organizations:   . Attends Banker Meetings:   Marland Kitchen Marital Status:   Intimate Programme researcher, broadcasting/film/video  Violence:   . Fear of Current or Ex-Partner:   . Emotionally Abused:   Marland Kitchen Physically Abused:   . Sexually Abused:     Past Medical History, Surgical history, Social history, and Family history were reviewed and updated as appropriate.   Please see review of systems for further details on the patient's review from today.   Objective:   Physical Exam:  There were no vitals taken for this visit.  Physical Exam Constitutional:      General: She is not in acute distress.    Appearance: She is well-developed.  Musculoskeletal:        General: No deformity.  Neurological:     Mental Status: She is alert and oriented to person, place, and time.     Motor: No tremor.     Coordination: Coordination normal.     Gait: Gait normal.  Psychiatric:        Attention and Perception: Perception normal. She is attentive.        Mood and Affect: Mood is not anxious or depressed. Affect is not labile, blunt, angry or inappropriate.        Speech: Speech is not rapid and pressured or slurred.        Behavior: Behavior normal.        Thought Content: Thought content normal. Thought content does not include homicidal or suicidal ideation. Thought content does not include homicidal or suicidal plan.        Cognition and Memory: Cognition normal.        Judgment: Judgment  normal.     Comments: Insight intact. No auditory or visual hallucinations. No delusions.  Affect is upbeat and calm.     Lab Review:     Component Value Date/Time   NA 134 (L) 09/02/2019 0249   NA 140 11/18/2018 1544   K 3.7 09/02/2019 0249   CL 105 09/02/2019 0249   CO2 20 (L) 09/02/2019 0249   GLUCOSE 129 (H) 09/02/2019 0249   BUN 15 09/02/2019 0249   BUN 20 11/18/2018 1544   CREATININE 0.76 09/02/2019 0249   CREATININE 0.78 01/27/2019 0811   CALCIUM 8.2 (L) 09/02/2019 0249   PROT 7.1 01/27/2019 0811   ALBUMIN 3.7 01/27/2019 0811   AST 14 (L) 01/27/2019 0811   ALT 14 01/27/2019 0811   ALKPHOS 54 01/27/2019 0811   BILITOT 0.5 01/27/2019 0811   GFRNONAA >60 09/02/2019 0249   GFRNONAA >60 01/27/2019 0811   GFRAA >60 09/02/2019 0249   GFRAA >60 01/27/2019 0811       Component Value Date/Time   WBC 4.5 01/29/2020 0907   RBC 4.57 01/29/2020 0907   HGB 10.9 (L) 01/29/2020 0907   HGB 12.9 07/17/2019 0953   HCT 36.1 01/29/2020 0907   HCT 21.9 (L) 12/25/2018 1221   PLT 333 01/29/2020 0907   PLT 286 07/17/2019 0953   MCV 79.0 (L) 01/29/2020 0907   MCH 23.9 (L) 01/29/2020 0907   MCHC 30.2 01/29/2020 0907   RDW 17.6 (H) 01/29/2020 0907   LYMPHSABS 1.3 01/29/2020 0907   MONOABS 0.5 01/29/2020 0907   EOSABS 0.1 01/29/2020 0907   BASOSABS 0.0 01/29/2020 0907    No results found for: POCLITH, LITHIUM   No results found for: PHENYTOIN, PHENOBARB, VALPROATE, CBMZ   .res Assessment: Plan:    Major depressive disorder, recurrent episode, moderate (HCC)  Attention deficit hyperactivity disorder (ADHD), predominantly inattentive type  Generalized anxiety disorder  Insomnia due to mental condition  Alcohol use  disorder, moderate, in early remission, dependence (HCC)   At visit November 12, 2019. We discussed Patient developed an increasingly severe alcohol dependence problem since her last visit in January.  [facility] and has had no alcohol since  then except 1 day.  She never abused stimulants but they took her off the stimulants at [facility].  Her ADD was markedly worse.  The Wellbutrin did not helped the ADD.   No benefit likely with low dose Strattera and not likely to get as significant an effect vs stimulant for ADD.  ADD is major px causing mood sx.  Therefore at the visit December 2020, Strattera and Wellbutrin were stopped.  Fluoxetine was restarted at a lower dose than she has typically taken at 20 mg daily.  Adderall was prescribed as Adderall XR 20 mg twice daily.  Insurance would only cover it once a day.  Her mood and anxiety are under good control.  The Adderall XR lasts until about 3 PM.  Patient has been under my care for many years.  There is no history of stimulant abuse and no other substance abuse including no marijuana abuse.  We discussed the pros and cons of potentially restarting the Adderall because her ADD is out of control.  Alcohol is a sedative and Adderall as a stimulant so they produce markedly different effects.  I do not believe the patient is a significant risk for abusing Adderall nor do I believe the patient is likely to experience craving for alcohol if she uses Adderall.  There is been no abuse of Adderall and she is remained sober from alcohol.  Discussed potential benefits, risks, and side effects of stimulants with patient to include increased heart rate, palpitations, insomnia, increased anxiety, increased irritability, or decreased appetite.  Instructed patient to contact office if experiencing any significant tolerability issues.  Working on sobritety and has been successful so far.     Recognizes effect of anemia on ADD and mood.  Had iron infusions last winter.  Continue Prozac 20 mg daily. May have to increase the dose at some point in the future given that she usually was taking higher dosages but she is getting good response at this time. Option Wellbutrin off label for ADD since can't  get 2 ADDERALL daily. 150 mg daily then 300 mg daily. She can adjust the dose between 150 mg and 300 mg daily to get the optimal effect.  We discussed side effects in detail including the potential for additive side effects with the stimulant.  Historically fluoxetine was more effective than other antidepressants.  She was last taking 60 mg a day but she may not need this much since she has been off of it for several months and she is not drinking now.  She may need to increase fluoxetine at follow-up.  No further prescriptions for benzodiazepines.  She agrees.  FU 6 mos  Meredith Staggersarey Cottle, MD, DFAPA     Please see After Visit Summary for patient specific instructions.  Future Appointments  Date Time Provider Department Center  08/10/2020 10:00 AM [facility] ONLY [facility] None  08/10/2020 10:40 AM Johney MaineKale, Gautam Kishore, MD [facility] None    No orders of the defined types were placed in this encounter.     -------------------------------

## 2020-06-16 ENCOUNTER — Other Ambulatory Visit: Payer: Self-pay | Admitting: Psychiatry

## 2020-06-16 DIAGNOSIS — F5105 Insomnia due to other mental disorder: Secondary | ICD-10-CM

## 2020-07-02 ENCOUNTER — Other Ambulatory Visit: Payer: Self-pay | Admitting: Psychiatry

## 2020-07-02 DIAGNOSIS — F331 Major depressive disorder, recurrent, moderate: Secondary | ICD-10-CM

## 2020-07-10 ENCOUNTER — Other Ambulatory Visit: Payer: Self-pay | Admitting: Psychiatry

## 2020-07-10 DIAGNOSIS — F5105 Insomnia due to other mental disorder: Secondary | ICD-10-CM

## 2020-07-30 ENCOUNTER — Other Ambulatory Visit: Payer: Self-pay | Admitting: Psychiatry

## 2020-07-30 DIAGNOSIS — F331 Major depressive disorder, recurrent, moderate: Secondary | ICD-10-CM

## 2020-08-01 NOTE — Telephone Encounter (Signed)
review 

## 2020-08-09 ENCOUNTER — Other Ambulatory Visit: Payer: Self-pay

## 2020-08-09 ENCOUNTER — Telehealth: Payer: Self-pay | Admitting: Psychiatry

## 2020-08-09 DIAGNOSIS — F900 Attention-deficit hyperactivity disorder, predominantly inattentive type: Secondary | ICD-10-CM

## 2020-08-09 MED ORDER — AMPHETAMINE-DEXTROAMPHET ER 20 MG PO CP24: 20.0000 mg | ORAL_CAPSULE | Freq: Every day | ORAL | 0 refills | Status: DC

## 2020-08-09 NOTE — Telephone Encounter (Signed)
Last refill 07/08/2020 Pended 3 separate Rx's for Dr. Jennelle Human to send

## 2020-08-09 NOTE — Telephone Encounter (Signed)
Pt LM requesting new Rx for Adderall to pharmacy. August was last [facility] Apt [facility]

## 2020-08-10 ENCOUNTER — Inpatient Hospital Stay: Payer: 59 | Attending: Hematology | Admitting: Hematology

## 2020-08-10 ENCOUNTER — Inpatient Hospital Stay: Payer: 59

## 2020-08-12 ENCOUNTER — Telehealth: Payer: Self-pay | Admitting: Hematology

## 2020-08-12 NOTE — Telephone Encounter (Signed)
Called pt per 9/30 sch smg - no answer. Left message for patient to call back and reschedule missed appt.

## 2020-10-03 ENCOUNTER — Other Ambulatory Visit: Payer: Self-pay | Admitting: Psychiatry

## 2020-10-03 DIAGNOSIS — F411 Generalized anxiety disorder: Secondary | ICD-10-CM

## 2020-10-03 DIAGNOSIS — F331 Major depressive disorder, recurrent, moderate: Secondary | ICD-10-CM

## 2020-10-10 ENCOUNTER — Ambulatory Visit: Payer: 59

## 2020-10-27 ENCOUNTER — Ambulatory Visit: Payer: 59 | Attending: Internal Medicine

## 2020-10-27 DIAGNOSIS — Z23 Encounter for immunization: Secondary | ICD-10-CM

## 2020-10-27 NOTE — Progress Notes (Signed)
   Covid-19 Vaccination Clinic  Name:  Laura Chang    MRN: [id number] DOB: 10-Jan-1956  10/27/2020  [name] was observed post Covid-19 immunization for 15 minutes without incident. She was provided with Vaccine Information Sheet and instruction to access the V-Safe system.   [name] was instructed to call 911 with any severe reactions post vaccine: Marland Kitchen Difficulty breathing  . Swelling of face and throat  . A fast heartbeat  . A bad rash all over body  . Dizziness and weakness   Immunizations Administered    Name Date Dose VIS Date Route   Pfizer COVID-19 Vaccine 10/27/2020  1:33 PM 0.3 mL 08/31/2020 Intramuscular   Manufacturer: [employer], [employer]   Lot: [id number]   NDC: [address]-2

## 2020-10-30 IMAGING — CT CT HEART MORP W/ CTA COR W/ SCORE W/ CA W/CM &/OR W/O CM
4 of 7 series · 8 of 20 positions shown, 9 images · non-contrast
Comparison: None.

Addendum:
EXAM:
OVER-READ INTERPRETATION  CT CHEST

The following report is an over-read performed by radiologist Dr.
Hermi Disla [REDACTED] on 11/19/2018. This over-read
does not include interpretation of cardiac or coronary anatomy or
pathology. The coronary CTA interpretation by the cardiologist is
attached.
HISTORY: Dx chest pain
Cardiac/Coronary  CT
TECHNIQUE: The patient was scanned on a Siemens Force scanner.
PROTOCOL: A 120 kV prospective scan was triggered in the descending thoracic
aorta at 111 HU's. Axial non-contrast 3 mm slices were carried out
through the heart. The data set was analyzed on a dedicated work
station and scored using the Agatson method. Gantry rotation speed
was 250 msecs and collimation was .6 mm. 0.8 mg of sl NTG was given.
The 3D data set was reconstructed in 5% intervals of the 67-82 % of
the R-R cycle. Diastolic phases were analyzed on a dedicated work
station using MPR, MIP and VRT modes. The patient received 80 cc of
contrast.

[Series 7: best diast 74 % · axial · 0.39mm/px · z∈[+1301,+1348]mm · 2 of 348 slices shown, 3 images]
[im 116/348  vessel]
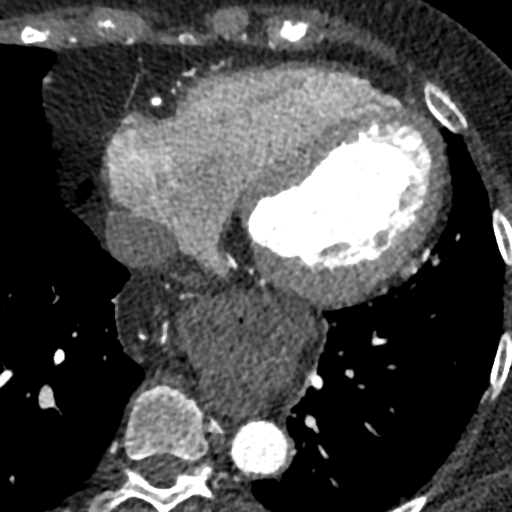
[im 116/348  lung]
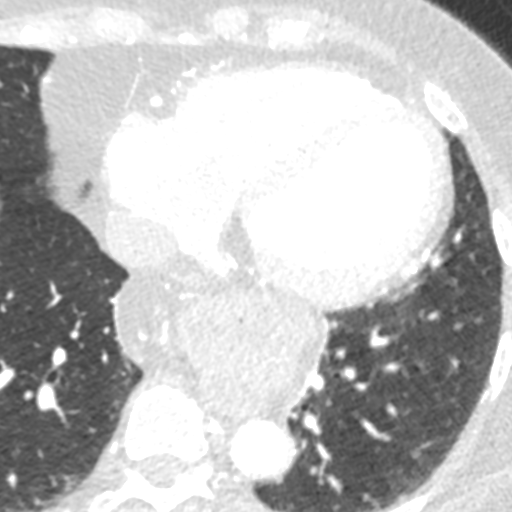
[im 232/348  vessel]
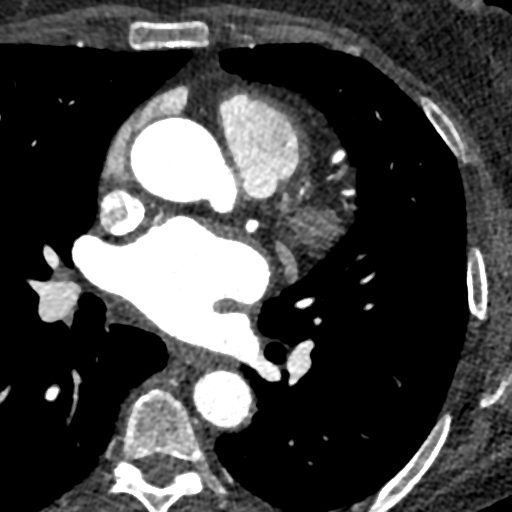

[Series 8: best syst 37 % · axial · 0.39mm/px · z∈[+1301,+1348]mm · 2 of 348 slices shown]
[im 116/348  vessel]
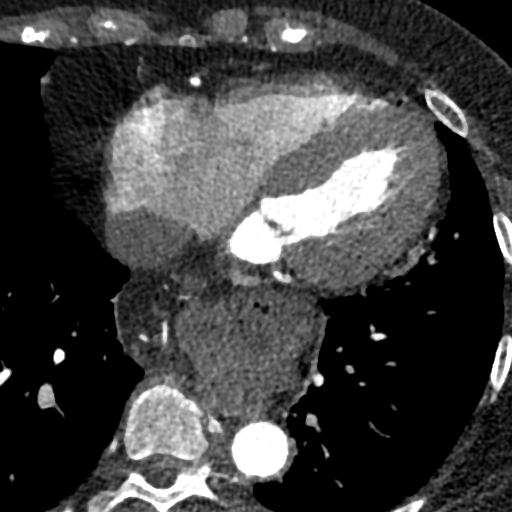
[im 232/348  vessel]
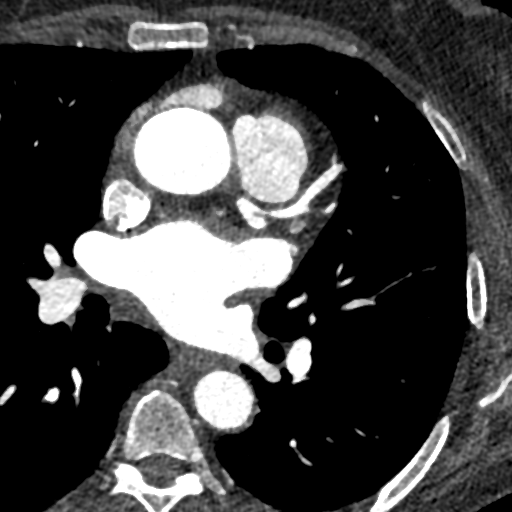

[Series 9: ts diast sharp 74 % · axial · 0.39mm/px · z∈[+1301,+1348]mm · 2 of 348 slices shown]
[im 116/348  lung]
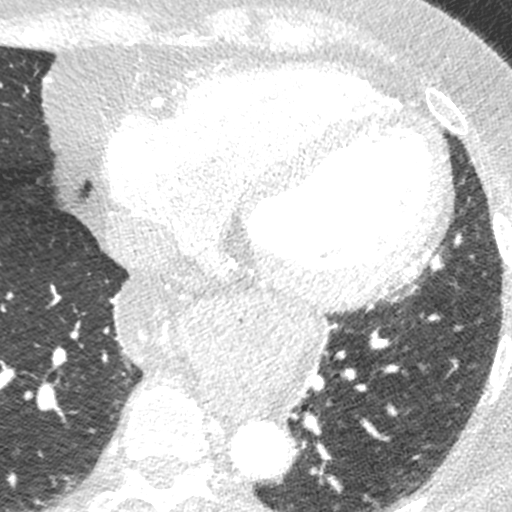
[im 232/348  lung]
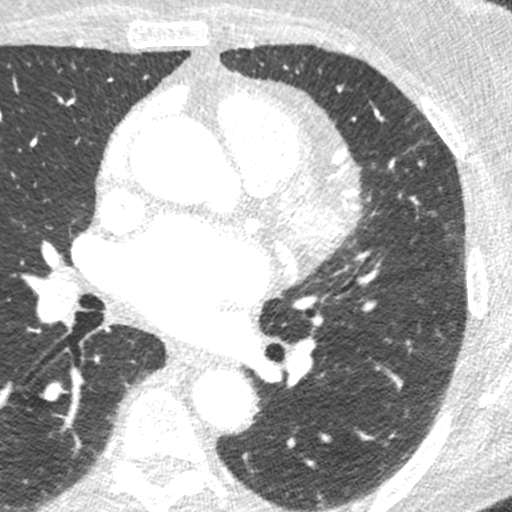

[Series 10: ts syst sharp 37 % · axial · 0.39mm/px · z∈[+1301,+1348]mm · 2 of 348 slices shown]
[im 116/348  lung]
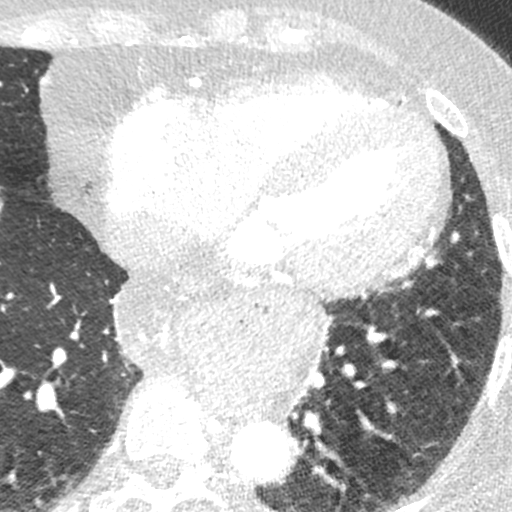
[im 232/348  lung]
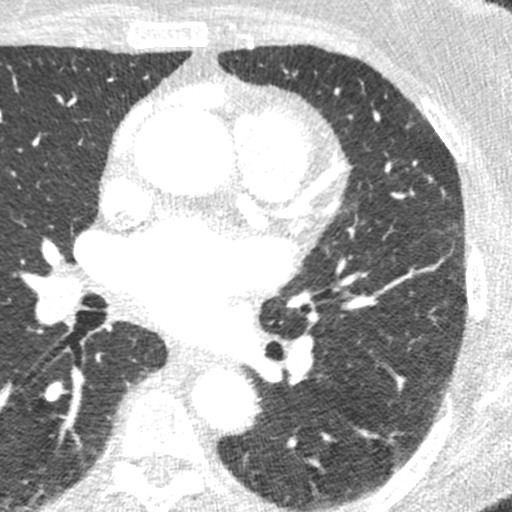

[8 of 20 positions shown; findings below may reference images not displayed]

FINDINGS: Vascular: Heart is upper limits normal in size. Aorta is normal
caliber.

Mediastinum/Nodes: No adenopathy in the lower mediastinum or hila.
Large hiatal hernia present.

Lungs/Pleura: No confluent opacities or effusions.

Upper Abdomen: Imaging into the upper abdomen shows no acute
findings.

Musculoskeletal: Chest wall soft tissues are unremarkable. No acute
bony abnormality.
IMPRESSION: No acute or significant extracardiac abnormality.
FINDINGS: Coronary calcium score: The patient's coronary artery calcium score
is 0, which places the patient in the 0 percentile.

Coronary arteries: Normal coronary origins. Right dominance.
Tortuous distal coronary arteries.

Right Coronary Artery: No detectable plaque or stenosis. Patent PDA
and PL

Left Main Coronary Artery: No detectable plaque or stenosis.

Left Anterior Descending Coronary Artery: No detectable plaque or
stenosis. Patent diagonal branches.

Left Circumflex Artery: No detectable plaque or stenosis. Patent OM.

Aorta:  Normal size.  No calcifications.  No dissection.

Aortic Valve: No calcifications.

Other findings:

Normal pulmonary vein drainage into the left atrium.

Normal left atrial appendage without a thrombus.

Normal size of the pulmonary artery.
IMPRESSION: 1. Coronary calcium score of 0. This was 0 percentile for age and
sex matched control.

2. Normal coronary origin with right dominance.

3. No evidence of CAD, CADRADS = 0.

*** End of Addendum ***

## 2020-11-01 ENCOUNTER — Other Ambulatory Visit: Payer: Self-pay | Admitting: Psychiatry

## 2020-11-01 DIAGNOSIS — F411 Generalized anxiety disorder: Secondary | ICD-10-CM

## 2020-11-01 DIAGNOSIS — F331 Major depressive disorder, recurrent, moderate: Secondary | ICD-10-CM

## 2020-11-08 ENCOUNTER — Other Ambulatory Visit: Payer: Self-pay | Admitting: Psychiatry

## 2020-11-08 ENCOUNTER — Telehealth: Payer: Self-pay | Admitting: Psychiatry

## 2020-11-08 DIAGNOSIS — F900 Attention-deficit hyperactivity disorder, predominantly inattentive type: Secondary | ICD-10-CM

## 2020-11-08 MED ORDER — AMPHETAMINE-DEXTROAMPHET ER 20 MG PO CP24: 20.0000 mg | ORAL_CAPSULE | Freq: Every day | ORAL | 0 refills | Status: DC

## 2020-11-08 NOTE — Telephone Encounter (Signed)
Pt called and said that she needs a refill on her adderall xr 20 mg [facility] [address]. Next appt 11/10/20

## 2020-11-10 ENCOUNTER — Ambulatory Visit: Payer: 59 | Admitting: Psychiatry

## 2020-11-30 ENCOUNTER — Other Ambulatory Visit: Payer: Self-pay | Admitting: Psychiatry

## 2020-11-30 DIAGNOSIS — F331 Major depressive disorder, recurrent, moderate: Secondary | ICD-10-CM

## 2020-12-01 ENCOUNTER — Other Ambulatory Visit: Payer: Self-pay | Admitting: Psychiatry

## 2020-12-01 DIAGNOSIS — F5105 Insomnia due to other mental disorder: Secondary | ICD-10-CM

## 2020-12-12 ENCOUNTER — Other Ambulatory Visit: Payer: Self-pay | Admitting: Psychiatry

## 2020-12-12 ENCOUNTER — Telehealth: Payer: Self-pay | Admitting: Psychiatry

## 2020-12-12 DIAGNOSIS — F900 Attention-deficit hyperactivity disorder, predominantly inattentive type: Secondary | ICD-10-CM

## 2020-12-12 MED ORDER — AMPHETAMINE-DEXTROAMPHET ER 20 MG PO CP24: 20.0000 mg | ORAL_CAPSULE | Freq: Every day | ORAL | 0 refills | Status: DC

## 2020-12-12 NOTE — Telephone Encounter (Signed)
Laura Chang is requesting a RF on her Adderall. [facility] [address]

## 2020-12-30 ENCOUNTER — Other Ambulatory Visit: Payer: Self-pay | Admitting: *Deleted

## 2020-12-30 MED ORDER — POLYSACCHARIDE IRON COMPLEX 150 MG PO CAPS
150.0000 mg | ORAL_CAPSULE | Freq: Every day | ORAL | 1 refills | Status: DC
Start: 2020-12-30 — End: 2021-01-24

## 2020-12-30 NOTE — Telephone Encounter (Signed)
Patient called. Has appt on 01/12/21 with Dr. Candise Che. Is out of iron and would like a refill prior to appt if possible. Refill request routed to Dr. Candise Che

## 2021-01-10 ENCOUNTER — Telehealth: Payer: Self-pay

## 2021-01-10 NOTE — Telephone Encounter (Signed)
Received call from pt. to clarify her appt. day. Confirmed Lab and MD appt. for Thursday 01/12/2021 at 1:45pm.

## 2021-01-11 NOTE — Progress Notes (Signed)
HEMATOLOGY/ONCOLOGY CLINIC NOTE  Date of Service: 01/12/2021  Patient Care Team: Su Ley as PCP - General (Physician Assistant) Parke Poisson, MD as PCP - Cardiology (Cardiology)  CHIEF COMPLAINTS/PURPOSE OF CONSULTATION:  Iron deficiency anemia  HISTORY OF PRESENTING ILLNESS:   Laura Chang is a  65 y.o. female who has been referred to [place] by Dr. Deatra James for evaluation and management of Iron deficiency anemia. The pt reports that she is doing well overall.  The pt reports that she has had anemia for more than 5 years. She has recently seen Dr. Jerl Santos for considerations of a right hip replacement.  The pt notes that she began Meloxicam and 600mg  Ibuprofen in August 2019 after a peridontal procedure. She then developed "out of the blue" extreme fatigue, which she notes was similar to a previous time when her HGB was in the 4s, a few years ago. She then stopped taking NSAIDs, established care with PCP Dr. September 2019, and was referred to GI Dr. Wynelle Link.   She notes that she then had an endoscopy and colonoscopy which was significant for a large hiatal hernia and ulcerations at the gastroesophageal junction. She then began 325mg  Ferrous Sulfate in November 2019.   The pt also notes that she contracted a GI virus and developed significant diarrhea within the last month.   The pt notes that she has had blood transfusions and received an IV Iron transfusion once in the past without complication. The pt notes that she has felt recently tired. She denies abdominal pains or leg swelling.  The pt notes that she was consuming "lots of alcohol 3-4 years ago." She notes that she has decreased her consumption to about 8-14 drinks of alcohol per week currently.   Most recent lab results (12/12/18) of CBC w/diff and BMP is as follows: all values are WNL except for RBC at 3.39, HGB at 7.5, HCT at 27.4, MCH at 22.1, MCHC at 27.4, PLT at 419k, Glucose at 102.  On review of systems, pt  reports recent concern for infection, lower energy levels, and denies pain along the spine, abdominal pains, leg swelling, and any other symptoms.  On PMHx the pt reports Iron deficiency anemia, ADD. On Social Hx the pt reports quit smoking cigarettes in 1980s, Consumes 8-14 drinks of alcohol per week. On Family Hx the pt reports father with heart attack at 34 years old.   INTERVAL HISTORY:  Laura Chang returns today for management and evaluation of her Iron deficiency anemia. The patient's last visit with 55 was on 01/31/2020. The pt reports that she is doing well overall.  The pt reports no new symptoms or concerns. She notes that she ran out of the Iron Polysaccharide and did not take them for a week. She notes that she has stopped drinking completely for over a year, then has had two drinks since November total. She denies thinking about alcohol anymore and has been doing a mindfulness program and releasing comdemnatory self thoughts. The pt also notes she has lost some weight and has been taking care of herself better. She notes that the IV Iron helped jump start her to this new way of improving herself. She denies any bleeding issues or GI bleeding. The pt had her hip replacement surgery and is walking 10,000 steps daily and staying very active.   Lab results today 01/12/2021 of CBC w/diff and CMP is as follows: all values are WNL. CMP pending.  01/12/2021 Ferritin 25  01/12/2021 Iron sat 28%  On review of systems, pt denies bleeding issues, blood in stools, changes in bowel habits, black stools, abdominal pain, leg swelling, and any other symptoms.  MEDICAL HISTORY:  Past Medical History:  Diagnosis Date  . ADHD   . Anemia   . Anxiety   . Arthritis   . Depression   . Heart murmur    i went to see a cardiologit slast eyar  and i had zero plaque,   . PONV (postoperative nausea and vomiting)   . Recovering alcoholic in remission [facility])     SURGICAL HISTORY: Past Surgical  History:  Procedure Laterality Date  . CHOLECYSTECTOMY     1993  . COLONOSCOPY  10/25/2012   Procedure: COLONOSCOPY;  Surgeon: Barrie Folk, MD;  Location: [facility] ENDOSCOPY;  Service: Endoscopy;  Laterality: N/A;  . COLONOSCOPY  2019  . ESOPHAGOGASTRODUODENOSCOPY  10/24/2012   Procedure: ESOPHAGOGASTRODUODENOSCOPY (EGD);  Surgeon: Vertell Novak., MD;  Location: [facility] ENDOSCOPY;  Service: Endoscopy;  Laterality: N/A;  . TOTAL HIP ARTHROPLASTY Right 09/01/2019   Procedure: RIGHT TOTAL HIP ARTHROPLASTY ANTERIOR APPROACH;  Surgeon: Marcene Corning, MD;  Location: [facility] ORS;  Service: Orthopedics;  Laterality: Right;  . UPPER GI ENDOSCOPY  2019    SOCIAL HISTORY: Social History   Socioeconomic History  . Marital status: Married    Spouse name: Not on file  . Number of children: Not on file  . Years of education: Not on file  . Highest education level: Not on file  Occupational History  . Not on file  Tobacco Use  . Smoking status: Former Smoker    Quit date: 08/16/2003    Years since quitting: 17.4  . Smokeless tobacco: Never Used  . Tobacco comment:  08-28-2019 "i smoked 2 cigarettes in the last month since my father  passed"  Vaping Use  . Vaping Use: Never used  Substance and Sexual Activity  . Alcohol use: Yes    Alcohol/week: 10.0 standard drinks    Types: 10 Glasses of wine per week  . Drug use: No  . Sexual activity: Not on file  Other Topics Concern  . Not on file  Social History Narrative  . Not on file   Social Determinants of Health   Financial Resource Strain: Not on file  Food Insecurity: Not on file  Transportation Needs: Not on file  Physical Activity: Not on file  Stress: Not on file  Social Connections: Not on file  Intimate Partner Violence: Not on file    FAMILY HISTORY: Family History  Problem Relation Age of Onset  . Atrial fibrillation Mother   . CAD Father     ALLERGIES:  is allergic to metronidazole and ferrlecit [na ferric gluc cplx in  sucrose].  MEDICATIONS:  Current Outpatient Medications  Medication Sig Dispense Refill  . amphetamine-dextroamphetamine (ADDERALL XR) 20 MG 24 hr capsule Take 1 capsule (20 mg total) by mouth daily. 30 capsule 0  . amphetamine-dextroamphetamine (ADDERALL XR) 20 MG 24 hr capsule Take 1 capsule (20 mg total) by mouth daily. 30 capsule 0  . amphetamine-dextroamphetamine (ADDERALL XR) 20 MG 24 hr capsule Take 1 capsule (20 mg total) by mouth daily. 30 capsule 0  . buPROPion (WELLBUTRIN XL) 150 MG 24 hr tablet TAKE 3 TABLETS (450 MG TOTAL) BY MOUTH DAILY. 270 tablet 0  . FLUoxetine (PROZAC) 20 MG capsule TAKE 1 CAPSULE BY MOUTH EVERY DAY 90 capsule 1  . iron polysaccharides (NIFEREX) 150 MG capsule Take  1 capsule (150 mg total) by mouth daily. 30 capsule 1  . traZODone (DESYREL) 50 MG tablet TAKE 1 TABLET (50 MG TOTAL) BY MOUTH AT BEDTIME AS NEEDED FOR SLEEP. 90 tablet 2   No current facility-administered medications for this visit.    REVIEW OF SYSTEMS:   10 Point review of Systems was done is negative except as noted above.   PHYSICAL EXAMINATION:  Vitals:   01/12/21 1455  BP: (!) 144/92  Pulse: 64  Resp: 18  Temp: 97.6 F (36.4 C)  SpO2: 100%   Filed Weights   01/12/21 1455  Weight: 155 lb 14.4 oz (70.7 kg)   Body mass index is 25.94 kg/m.   GENERAL:alert, in no acute distress and comfortable SKIN: no acute rashes, no significant lesions EYES: conjunctiva are pink and non-injected, sclera anicteric OROPHARYNX: MMM, no exudates, no oropharyngeal erythema or ulceration NECK: supple, no JVD LYMPH:  no palpable lymphadenopathy in the cervical, axillary or inguinal regions LUNGS: clear to auscultation b/l with normal respiratory effort HEART: regular rate & rhythm ABDOMEN:  normoactive bowel sounds , non tender, not distended. Extremity: no pedal edema PSYCH: alert & oriented x 3 with fluent speech NEURO: no focal motor/sensory deficits   LABORATORY DATA:  I have  reviewed the data as listed   CBC Latest Ref Rng & Units 01/12/2021 01/29/2020 09/02/2019  WBC 4.0 - 10.5 K/uL 4.5 4.5 8.5  Hemoglobin 12.0 - 15.0 g/dL 03.4 10.9(L) 9.6(L)  Hematocrit 36.0 - 46.0 % 38.5 36.1 28.8(L)  Platelets 150 - 400 K/uL 272 333 212    CMP Latest Ref Rng & Units 09/02/2019 08/28/2019 01/27/2019  Glucose 70 - 99 mg/dL 742(V) 956(L) 875(I)  BUN 8 - 23 mg/dL 15 18 16   Creatinine 0.44 - 1.00 mg/dL 4.33 2.95  Sodium 135 - 145 mmol/L 134(L) 139 142  Potassium 3.5 - 5.1 mmol/L 3.7 4.4 4.1  Chloride 98 - 111 mmol/L 105 105 109  CO2 22 - 32 mmol/L 20(L) 25 20(L)  Calcium 8.9 - 10.3 mg/dL 8.2(L) 9.4 9.3  Total Protein 6.5 - 8.1 g/dL - - 7.1  Total Bilirubin 0.3 - 1.2 mg/dL - - 0.5  Alkaline Phos 38 - 126 U/L - - 54  AST 15 - 41 U/L - - 14(L)  ALT 0 - 44 U/L - - 14    . Lab Results  Component Value Date   IRON 94 01/12/2021   TIBC 338 01/12/2021   IRONPCTSAT 28 01/12/2021   (Iron and TIBC)  Lab Results  Component Value Date   FERRITIN 25 01/12/2021     RADIOGRAPHIC STUDIES: I have personally reviewed the radiological images as listed and agreed with the findings in the report. No results found.  ASSESSMENT & PLAN:   65 y.o. female with  1. Iron deficiency anemia Recurrent  Likely from ongoing GI bleeding probably from her significant hiatal hernia and cameron ulcerations + NSAID use + ETOH abuse. Frequent NSAID use +Prozac an additional risk factors in addition to [facility] + Cameron ulcers  Las upon initial presentation from 12/12/18, HGB at 7.5 with MCV at 80.8. Normal creatinine Additional element of blood loss from her orthopedic surgery.  2. Reaction to IV Ferrelecit  PLAN -Discussed pt labwork today, 01/12/2021; blood counts all completely normal, ferritin 25 with iron saturation of 28% -Recommended pt continue to stay active and drink 48-64 oz water daily.  -Continue Iron polysaccharide  -no indication for IV iron at this time. -encounraged  continued ETOH  abstinence. -Will see back as needed. Pt is aware to f/u w labs w PCP.   FOLLOW UP: RTC with PCP   The total time spent in the appointment was 20 minutes and more than 50% was on counseling and direct patient cares.   All of the patient's questions were answered with apparent satisfaction. The patient knows to call the clinic with any problems, questions or concerns.    Wyvonnia LoraGautam Keighley Deckman MD MS AAHIVMS [facility] Hematology/Oncology Physician [facility]  (Office):       [phone] (Work cell):  [phone] (Fax):           [phone]  01/12/2021 3:09 PM  I, Minda Meooss Judd, am acting as scribe for Dr. Wyvonnia LoraGautam Amylah Will, MD.     .I have reviewed the above documentation for accuracy and completeness, and I agree with the above. Johney Maine.Lavine Hargrove Kishore Trella Thurmond MD

## 2021-01-12 ENCOUNTER — Inpatient Hospital Stay: Payer: 59 | Attending: Hematology

## 2021-01-12 ENCOUNTER — Inpatient Hospital Stay: Payer: 59 | Admitting: Hematology

## 2021-01-12 ENCOUNTER — Other Ambulatory Visit: Payer: Self-pay

## 2021-01-12 VITALS — BP 144/92 | HR 64 | Temp 97.6°F | Resp 18 | Ht 65.0 in | Wt 155.9 lb

## 2021-01-12 DIAGNOSIS — D509 Iron deficiency anemia, unspecified: Secondary | ICD-10-CM | POA: Insufficient documentation

## 2021-01-12 DIAGNOSIS — M1990 Unspecified osteoarthritis, unspecified site: Secondary | ICD-10-CM | POA: Diagnosis not present

## 2021-01-12 DIAGNOSIS — F909 Attention-deficit hyperactivity disorder, unspecified type: Secondary | ICD-10-CM | POA: Diagnosis not present

## 2021-01-12 DIAGNOSIS — K449 Diaphragmatic hernia without obstruction or gangrene: Secondary | ICD-10-CM | POA: Insufficient documentation

## 2021-01-12 DIAGNOSIS — R5383 Other fatigue: Secondary | ICD-10-CM | POA: Diagnosis not present

## 2021-01-12 DIAGNOSIS — Z87891 Personal history of nicotine dependence: Secondary | ICD-10-CM | POA: Diagnosis not present

## 2021-01-12 DIAGNOSIS — Z79899 Other long term (current) drug therapy: Secondary | ICD-10-CM | POA: Insufficient documentation

## 2021-01-12 DIAGNOSIS — Z8249 Family history of ischemic heart disease and other diseases of the circulatory system: Secondary | ICD-10-CM | POA: Diagnosis not present

## 2021-01-12 DIAGNOSIS — R634 Abnormal weight loss: Secondary | ICD-10-CM | POA: Insufficient documentation

## 2021-01-12 DIAGNOSIS — D500 Iron deficiency anemia secondary to blood loss (chronic): Secondary | ICD-10-CM

## 2021-01-12 DIAGNOSIS — F418 Other specified anxiety disorders: Secondary | ICD-10-CM | POA: Insufficient documentation

## 2021-01-12 LAB — IRON AND TIBC
Iron: 94 ug/dL (ref 41–142)
Saturation Ratios: 28 % (ref 21–57)
TIBC: 338 ug/dL (ref 236–444)
UIBC: 244 ug/dL (ref 120–384)

## 2021-01-12 LAB — CMP (CANCER CENTER ONLY)
ALT: 11 U/L (ref 0–44)
AST: 12 U/L — ABNORMAL LOW (ref 15–41)
Albumin: 3.9 g/dL (ref 3.5–5.0)
Alkaline Phosphatase: 46 U/L (ref 38–126)
Anion gap: 7 (ref 5–15)
BUN: 14 mg/dL (ref 8–23)
CO2: 22 mmol/L (ref 22–32)
Calcium: 8.9 mg/dL (ref 8.9–10.3)
Chloride: 108 mmol/L (ref 98–111)
Creatinine: 0.82 mg/dL (ref 0.44–1.00)
GFR, Estimated: >60 mL/min (ref >60)
Glucose, Bld: 94 mg/dL (ref 70–99)
Potassium: 3.8 mmol/L (ref 3.5–5.1)
Sodium: 137 mmol/L (ref 135–145)
Total Bilirubin: 0.5 mg/dL (ref 0.3–1.2)
Total Protein: 6.6 g/dL (ref 6.5–8.1)

## 2021-01-12 LAB — CBC WITH DIFFERENTIAL/PLATELET
Abs Immature Granulocytes: 0.01 10*3/uL (ref 0.00–0.07)
Basophils Absolute: 0 10*3/uL (ref 0.0–0.1)
Basophils Relative: 0 %
Eosinophils Absolute: 0 10*3/uL (ref 0.0–0.5)
Eosinophils Relative: 1 %
HCT: 38.5 % (ref 36.0–46.0)
Hemoglobin: 12.8 g/dL (ref 12.0–15.0)
Immature Granulocytes: 0 %
Lymphocytes Relative: 31 %
Lymphs Abs: 1.4 10*3/uL (ref 0.7–4.0)
MCH: 29.6 pg (ref 26.0–34.0)
MCHC: 33.2 g/dL (ref 30.0–36.0)
MCV: 89.1 fL (ref 80.0–100.0)
Monocytes Absolute: 0.4 10*3/uL (ref 0.1–1.0)
Monocytes Relative: 10 %
Neutro Abs: 2.6 10*3/uL (ref 1.7–7.7)
Neutrophils Relative %: 58 %
Platelets: 272 10*3/uL (ref 150–400)
RBC: 4.32 MIL/uL (ref 3.87–5.11)
RDW: 12.4 % (ref 11.5–15.5)
WBC: 4.5 10*3/uL (ref 4.0–10.5)
nRBC: 0 % (ref 0.0–0.2)

## 2021-01-12 LAB — FERRITIN: Ferritin: 25 ng/mL (ref 11–307)

## 2021-01-13 ENCOUNTER — Telehealth: Payer: Self-pay | Admitting: Psychiatry

## 2021-01-13 ENCOUNTER — Other Ambulatory Visit: Payer: Self-pay | Admitting: Psychiatry

## 2021-01-13 DIAGNOSIS — F900 Attention-deficit hyperactivity disorder, predominantly inattentive type: Secondary | ICD-10-CM

## 2021-01-13 MED ORDER — AMPHETAMINE-DEXTROAMPHET ER 20 MG PO CP24: 20.0000 mg | ORAL_CAPSULE | Freq: Every day | ORAL | 0 refills | Status: DC

## 2021-01-13 NOTE — Telephone Encounter (Signed)
Next visit is 01/24/21. Requesting refill on Adderall 20 mg. Pharmacy is [facility] at [address], [place], [place]. Phone # is [phone].

## 2021-01-24 ENCOUNTER — Other Ambulatory Visit: Payer: Self-pay

## 2021-01-24 ENCOUNTER — Ambulatory Visit (INDEPENDENT_AMBULATORY_CARE_PROVIDER_SITE_OTHER): Payer: 59 | Admitting: Psychiatry

## 2021-01-24 ENCOUNTER — Other Ambulatory Visit: Payer: Self-pay | Admitting: Hematology

## 2021-01-24 ENCOUNTER — Encounter: Payer: Self-pay | Admitting: Psychiatry

## 2021-01-24 VITALS — BP 155/106 | HR 75

## 2021-01-24 DIAGNOSIS — F411 Generalized anxiety disorder: Secondary | ICD-10-CM | POA: Diagnosis not present

## 2021-01-24 DIAGNOSIS — F3342 Major depressive disorder, recurrent, in full remission: Secondary | ICD-10-CM | POA: Diagnosis not present

## 2021-01-24 DIAGNOSIS — F5105 Insomnia due to other mental disorder: Secondary | ICD-10-CM | POA: Diagnosis not present

## 2021-01-24 DIAGNOSIS — F900 Attention-deficit hyperactivity disorder, predominantly inattentive type: Secondary | ICD-10-CM

## 2021-01-24 NOTE — Progress Notes (Signed)
Laura Chang [id number] 05/13/1956 65 y.o.  Subjective:   Patient ID:  Laura Chang is a 65 y.o. (DOB August 02, 1956) female.  Chief Complaint:  Chief Complaint  Patient presents with  . Follow-up  . Major depressive disorder, recurrent episode, moderate (HCC)  . ADHD    HPI Laura Chang presents to the office today for follow-up of depression and anxiety and ADD.  seen November 12, 2019.  Melted down in 2020.  Went to [facility] in July.  No withdrawal.  1 drink since then.  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 She was on no stimulant and no SSRI but was taking Strattera and Wellbutrin.  The following changes were made. Stop Strattera. OK restart stimulant bc severe ADD Restart Adderall 1 daily for a few days and if tolerated then restart 1 twice daily. If not tolerated reduce the dosage if needed. May need to stop Wellbutrin if not tolerating the stimulant.  Yes.  DC Wellbutrin Restart Prozac 20 mg daily.  February 2021 appointment with the following noted: Completed grant proposal.  Couldn't doit without Adderall.  Sold a bunch of work.   Adderall XR lasts about 3 pm.  Strength seems about right.  BP been OK.  Not jittery.   Stopped Wellbutrin but had no SE. Mood drastically better with grant proposal and back on fluoxetine.  Less depressed and lethargic.  No anxiety.  Cut back on coffee. Started back with devotions and stronger faith. Plan: Continue Prozac 20 mg daily. May have to increase the dose at some point in the future given that she usually was taking higher dosages but she is getting good response at this time. Restart Wellbutrin off label for ADD since can't get 2 ADDERALL daily. 150 mg daily then 300 mg daily. She can adjust the dose between 150 mg and 300 mg daily to get the optimal effect.   05/11/2020 appointment with the following noted: Has been inconsistent with Prozac and Wellbutrin. Not sure of the effect of  Wellbutrin. Biggest deterrent in work is anxiety.  Some of the work is conceptual and difficult at times.  Can feel she's not up to a project at times.  Overall is OK but would like a steadier benefit from stimulant.  Exhausted from managing concentration and keeping up with things from the day.  Loses things.  Not good keeping up with schedule. Overall productive and emotionally OK. Can feel Adderall wear off. Mood is better in summer and worse in the winter.   F died in October 01, 2023 and that is a loss. No SE Wellbutrin. Still attends AA meetings.  Real benefit from [facility] last year. Recognizes effect of anemia on ADD and mood.  Had iron infusions last winter. Plan:  Wellbutrin off label for ADD since can't get 2 ADDERALL daily. 150 mg daily then 300 mg daily.  01/24/2021 appointment with following noted: Doing a program called [employer] mindfulness app since Xmas.  CBT app helped the depression.  App helped her focus better.  Lost sign weight. Writing a lot. Before Xmas felt depressed and started negative thinking worse, self denigrating. Not drinking. More isolated.   Recognizes mo is narcissist.    Didn't tell anyone she was born until 3 mos later.  M aloof and uninterested in pt.  Lied about her birthday.  Mo lack of affection even with pt's kids. Going to AA for a year and it helped her to quit drinking. Also misses kids  being gone with a hole also.  D Middie most like her.  Past Psychiatric Medication Trials: fluoxetine, duloxetine, Viibryd, lamotrigine, Pristiq, sertraline, citalopram Adderall, Adderall XR, Vyvanse, Strattera low dose NR Lorazepam Trazodone Wellbutrin Depakote  At visit November 12, 2019. We discussed Patient developed an increasingly severe alcohol dependence problem since her last visit in January.  She went to [facility] and has had no alcohol since then except 1 day.  She never abused stimulants but they took her off the stimulants at [employer].  Her ADD was markedly worse.  The Wellbutrin did not helped the ADD.   Review of Systems:  Review of Systems  Constitutional: Positive for unexpected weight change.  Cardiovascular: Negative for chest pain and palpitations.  Musculoskeletal: Positive for arthralgias, back pain and joint swelling.       SP hip surgery October 2020  Neurological: Negative for tremors and weakness.  Psychiatric/Behavioral: Negative for agitation, behavioral problems, confusion, decreased concentration, dysphoric mood, hallucinations, self-injury, sleep disturbance and suicidal ideas. The patient is not nervous/anxious and is not hyperactive.     Medications: I have reviewed the patient's current medications.  Current Outpatient Medications  Medication Sig Dispense Refill  . amphetamine-dextroamphetamine (ADDERALL XR) 20 MG 24 hr capsule Take 1 capsule (20 mg total) by mouth daily. 30 capsule 0  . buPROPion (WELLBUTRIN XL) 150 MG 24 hr tablet TAKE 3 TABLETS (450 MG TOTAL) BY MOUTH DAILY. 270 tablet 0  . FLUoxetine (PROZAC) 20 MG capsule TAKE 1 CAPSULE BY MOUTH EVERY DAY (Patient taking differently: Pt takes 3 capsules a day.) 90 capsule 1  . iron polysaccharides (NIFEREX) 150 MG capsule TAKE 1 CAPSULE BY MOUTH EVERY DAY 30 capsule 1  . traZODone (DESYREL) 50 MG tablet TAKE 1 TABLET (50 MG TOTAL) BY MOUTH AT BEDTIME AS NEEDED FOR SLEEP. 90 tablet 2   No current facility-administered medications for this visit.    Medication Side Effects: None  Allergies:  Allergies  Allergen Reactions  . Metronidazole Shortness Of Breath and Other (See Comments)    Heart pounding  . Ferrlecit [Na Ferric Gluc Cplx In Sucrose] Other (See Comments)    Infusion reaction 05/12/2019    Past Medical History:  Diagnosis Date  . ADHD   . Anemia   . Anxiety   . Arthritis   . Depression   . Heart murmur    i went to see a cardiologit slast eyar  and i had zero plaque,   . PONV (postoperative nausea and vomiting)    . Recovering alcoholic in remission [facility])     Family History  Problem Relation Age of Onset  . Atrial fibrillation Mother   . CAD Father     Social History   Socioeconomic History  . Marital status: Married    Spouse name: Not on file  . Number of children: Not on file  . Years of education: Not on file  . Highest education level: Not on file  Occupational History  . Not on file  Tobacco Use  . Smoking status: Former Smoker    Quit date: 08/16/2003    Years since quitting: 17.4  . Smokeless tobacco: Never Used  . Tobacco comment:  08-28-2019 "i smoked 2 cigarettes in the last month since my father  passed"  Vaping Use  . Vaping Use: Never used  Substance and Sexual Activity  . Alcohol use: Yes    Alcohol/week: 10.0 standard drinks    Types: 10 Glasses of wine per week  .  Drug use: No  . Sexual activity: Not on file  Other Topics Concern  . Not on file  Social History Narrative  . Not on file   Social Determinants of Health   Financial Resource Strain: Not on file  Food Insecurity: Not on file  Transportation Needs: Not on file  Physical Activity: Not on file  Stress: Not on file  Social Connections: Not on file  Intimate Partner Violence: Not on file    Past Medical History, Surgical history, Social history, and Family history were reviewed and updated as appropriate.   Please see review of systems for further details on the patient's review from today.   Objective:   Physical Exam:  BP (!) 155/106   Pulse 75   Physical Exam Constitutional:      General: She is not in acute distress.    Appearance: She is well-developed.  Musculoskeletal:        General: No deformity.  Neurological:     Mental Status: She is alert and oriented to person, place, and time.     Motor: No tremor.     Coordination: Coordination normal.     Gait: Gait normal.  Psychiatric:        Attention and Perception: Perception normal. She is attentive.        Mood and Affect:  Mood is not anxious or depressed. Affect is not labile, blunt, angry or inappropriate.        Speech: Speech is not rapid and pressured or slurred.        Behavior: Behavior normal. Behavior is not agitated or slowed.        Thought Content: Thought content normal. Thought content does not include homicidal or suicidal ideation. Thought content does not include homicidal or suicidal plan.        Cognition and Memory: Cognition normal.        Judgment: Judgment normal.     Comments: Insight intact. No auditory or visual hallucinations. No delusions.  Affect is upbeat .  Depression recently resolved.     Lab Review:     Component Value Date/Time   NA 137 01/12/2021 1430   NA 140 11/18/2018 1544   K 3.8 01/12/2021 1430   CL 108 01/12/2021 1430   CO2 22 01/12/2021 1430   GLUCOSE 94 01/12/2021 1430   BUN 14 01/12/2021 1430   BUN 20 11/18/2018 1544   CREATININE 0.82 01/12/2021 1430   CALCIUM 8.9 01/12/2021 1430   PROT 6.6 01/12/2021 1430   ALBUMIN 3.9 01/12/2021 1430   AST 12 (L) 01/12/2021 1430   ALT 11 01/12/2021 1430   ALKPHOS 46 01/12/2021 1430   BILITOT 0.5 01/12/2021 1430   GFRNONAA >60 01/12/2021 1430   GFRAA >60 09/02/2019 0249   GFRAA >60 01/27/2019 0811       Component Value Date/Time   WBC 4.5 01/12/2021 1430   RBC 4.32 01/12/2021 1430   HGB 12.8 01/12/2021 1430   HGB 12.9 07/17/2019 0953   HCT 38.5 01/12/2021 1430   HCT 21.9 (L) 12/25/2018 1221   PLT 272 01/12/2021 1430   PLT 286 07/17/2019 0953   MCV 89.1 01/12/2021 1430   MCH 29.6 01/12/2021 1430   MCHC 33.2 01/12/2021 1430   RDW 12.4 01/12/2021 1430   LYMPHSABS 1.4 01/12/2021 1430   MONOABS 0.4 01/12/2021 1430   EOSABS 0.0 01/12/2021 1430   BASOSABS 0.0 01/12/2021 1430    No results found for: POCLITH, LITHIUM   No results found  for: PHENYTOIN, PHENOBARB, VALPROATE, CBMZ   .res Assessment: Plan:    Recurrent major depression in full remission (HCC)  Attention deficit hyperactivity disorder  (ADHD), predominantly inattentive type  Generalized anxiety disorder  Insomnia due to mental condition   Has continued to maintain sobriety as well as maintaining good mood effect from the medications.  She is functioning well.  No med changes indicated.  Patient has been under my care for many years.  There is no history of stimulant abuse and no other substance abuse including no marijuana abuse.  We discussed the pros and cons of potentially restarting the Adderall because her ADD is out of control.  Alcohol is a sedative and Adderall as a stimulant so they produce markedly different effects.  I do not believe the patient is a significant risk for abusing Adderall nor do I believe the patient is likely to experience craving for alcohol if she uses Adderall.  There is been no abuse of Adderall and she is remained sober from alcohol.  Discussed potential benefits, risks, and side effects of stimulants with patient to include increased heart rate, palpitations, insomnia, increased anxiety, increased irritability, or decreased appetite.  Instructed patient to contact office if experiencing any significant tolerability issues.  Maintain sobriety  Check BP  Recognizes effect of anemia on ADD and mood.  Had iron infusions last winter.  Continue Prozac 20 mg daily. May have to increase the dose at some point in the future given that she usually was taking higher dosages but she is getting good response at this time. Continue  Wellbutrin  450 mg AM.  Historically fluoxetine was more effective than other antidepressants.  She was last taking 60 mg a day but she may not need this much since she has been off of it for several months and she is not drinking now.  She may need to increase fluoxetine at follow-up.  No further prescriptions for benzodiazepines.  She agrees.  FU 4-6 mos  Meredith Staggers, MD, DFAPA     Please see After Visit Summary for patient specific instructions.  Future  Appointments  Date Time Provider Department Center  07/25/2021  4:00 PM Cottle, Steva Ready., MD [facility] None    No orders of the defined types were placed in this encounter.     -------------------------------

## 2021-02-07 ENCOUNTER — Encounter: Payer: Self-pay | Admitting: Psychiatry

## 2021-02-20 ENCOUNTER — Other Ambulatory Visit: Payer: Self-pay | Admitting: Hematology

## 2021-03-14 ENCOUNTER — Other Ambulatory Visit: Payer: Self-pay | Admitting: Hematology

## 2021-04-08 ENCOUNTER — Other Ambulatory Visit: Payer: Self-pay | Admitting: Hematology

## 2021-04-11 ENCOUNTER — Encounter: Payer: Self-pay | Admitting: Hematology

## 2021-04-20 ENCOUNTER — Other Ambulatory Visit: Payer: Self-pay | Admitting: Psychiatry

## 2021-04-20 ENCOUNTER — Telehealth: Payer: Self-pay | Admitting: Psychiatry

## 2021-04-20 DIAGNOSIS — F900 Attention-deficit hyperactivity disorder, predominantly inattentive type: Secondary | ICD-10-CM

## 2021-04-20 MED ORDER — AMPHETAMINE-DEXTROAMPHET ER 15 MG PO CP24: 15.0000 mg | ORAL_CAPSULE | Freq: Every day | ORAL | 0 refills | Status: DC

## 2021-04-20 NOTE — Telephone Encounter (Signed)
Patient lm requesting to speak with someone about weaning off of her Adderall. She would like to discuss changing the pharmacy for her other medications. # [id number]

## 2021-04-20 NOTE — Telephone Encounter (Signed)
Spoke to pt for about 20 mins.She stated she wants to wean off adderall because she feels like she does not need it anymore and it is making her more anxious.She stated she has been out of prozac for 2 weeks now and she is so anxious and nervous she feels "she is going to miss a plane fore her wedding" as an example.She stated she thinks she should also take less wellbutrin and more prozac.

## 2021-04-20 NOTE — Telephone Encounter (Signed)
Pt informed

## 2021-04-20 NOTE — Telephone Encounter (Signed)
Running out of the Prozac by itself could cause her anxiety to go up as well.  Have her restart the Prozac and full dose. I agree with her plan to reduce the Wellbutrin from 3 of the 150 mg tablets to 2 of the 150 mg tablets daily. Also agree with reducing the Adderall XR from 20 mg daily to 15 mg daily and I sent in a prescription for this dose reduction.  Reducing the Wellbutrin and the Adderall will reduce her anxiety some.  If we decide later we need to go up in the Prozac we can do it but since she has been off of it for a couple of weeks she should just restart the previous dose initially.

## 2021-04-20 NOTE — Telephone Encounter (Signed)
Reviewed

## 2021-05-04 ENCOUNTER — Other Ambulatory Visit: Payer: Self-pay

## 2021-05-04 ENCOUNTER — Ambulatory Visit (INDEPENDENT_AMBULATORY_CARE_PROVIDER_SITE_OTHER): Payer: 59 | Admitting: Psychiatry

## 2021-05-04 DIAGNOSIS — F900 Attention-deficit hyperactivity disorder, predominantly inattentive type: Secondary | ICD-10-CM | POA: Diagnosis not present

## 2021-05-04 DIAGNOSIS — F411 Generalized anxiety disorder: Secondary | ICD-10-CM

## 2021-05-04 DIAGNOSIS — F5105 Insomnia due to other mental disorder: Secondary | ICD-10-CM | POA: Diagnosis not present

## 2021-05-04 DIAGNOSIS — F3112 Bipolar disorder, current episode manic without psychotic features, moderate: Secondary | ICD-10-CM | POA: Diagnosis not present

## 2021-05-04 DIAGNOSIS — F1021 Alcohol dependence, in remission: Secondary | ICD-10-CM

## 2021-05-04 MED ORDER — RISPERIDONE 2 MG PO TABS: 2.0000 mg | ORAL_TABLET | Freq: Every day | ORAL | 0 refills | Status: DC

## 2021-05-04 NOTE — Progress Notes (Signed)
[name] [id number] 19-Jun-1956 65 y.o.  Subjective:   Patient ID:  Laura Chang is a 65 y.o. (DOB 1956-04-19) female.  Chief Complaint:  Chief Complaint  Patient presents with   Follow-up   ADHD   Manic Behavior    HPI [name] presents to the office today for follow-up of depression and anxiety and ADD.  seen November 12, 2019.  Melted down in 2020.  Went to [facility] in July.  No withdrawal.  1 drink since then.  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 She was on no stimulant and no SSRI but was taking Strattera and Wellbutrin.  The following changes were made. Stop Strattera. OK restart stimulant bc severe ADD Restart Adderall 1 daily for a few days and if tolerated then restart 1 twice daily. If not tolerated reduce the dosage if needed. May need to stop Wellbutrin if not tolerating the stimulant.  Yes.  DC Wellbutrin Restart Prozac 20 mg daily.  February 2021 appointment with the following noted: Completed grant proposal.  Couldn't doit without Adderall.  Sold a bunch of work.   Adderall XR lasts about 3 pm.  Strength seems about right.  BP been OK.  Not jittery.   Stopped Wellbutrin but had no SE. Mood drastically better with grant proposal and back on fluoxetine.  Less depressed and lethargic.  No anxiety.  Cut back on coffee. Started back with devotions and stronger faith. Plan: Continue Prozac 20 mg daily. May have to increase the dose at some point in the future given that she usually was taking higher dosages but she is getting good response at this time. Restart Wellbutrin off label for ADD since can't get 2 ADDERALL daily. 150 mg daily then 300 mg daily. She can adjust the dose between 150 mg and 300 mg daily to get the optimal effect.   05/11/2020 appointment with the following noted: Has been inconsistent with Prozac and Wellbutrin. Not sure of the effect of Wellbutrin. Biggest deterrent in work is anxiety.  Some of  the work is conceptual and difficult at times.  Can feel she's not up to a project at times.  Overall is OK but would like a steadier benefit from stimulant.  Exhausted from managing concentration and keeping up with things from the day.  Loses things.  Not good keeping up with schedule. Overall productive and emotionally OK. Can feel Adderall wear off. Mood is better in summer and worse in the winter.   F died in 10-18-2023 and that is a loss. No SE Wellbutrin. Still attends AA meetings.  Real benefit from [facility] last year. Recognizes effect of anemia on ADD and mood.  Had iron infusions last winter. Plan:  Wellbutrin off label for ADD since can't get 2 ADDERALL daily. 150 mg daily then 300 mg daily.  01/24/2021 appointment with following noted: Doing a program called [employer] mindfulness app since Xmas.  CBT app helped the depression.  App helped her focus better.  Lost sign weight. Writing a lot. Before Xmas felt depressed and started negative thinking worse, self denigrating. Not drinking. More isolated.   Recognizes mo is narcissist.    Didn't tell anyone she was born until 3 mos later.  M aloof and uninterested in pt.  Lied about her birthday.  Mo lack of affection even with pt's kids. Going to AA for a year and it helped her to quit drinking. Also misses kids being gone with a hole  also. Plan: No med changes  05/04/2021 appointment with the following noted: Therapist [name] thinks she's manic. Lost weight to 144#.   States she is still sleeping okay.  Admits she is hyper and recognizes that she is likely manic.  She feels great, euphoric with an increased sense of spiritual connectedness to God.  She has racing thoughts and talks fast and talks a lot and this is noted by her husband.  He thinks she is a bit hyper.  She has been able to maintain sobriety although she will have 1 glass of wine on special occasions but does not drink by herself.  She is not drinking to excess.   She denies any dangerous impulsivity.  She is clearly not depressed and not particularly anxious.  She has no concerns about her medication and she has been compliant.  D Middie most like her.  Past Psychiatric Medication Trials: fluoxetine, duloxetine, Viibryd, lamotrigine, Pristiq, sertraline, citalopram Adderall, Adderall XR, Vyvanse, Ritalin, Strattera low dose NR Lorazepam Trazodone Wellbutrin Depakote, lamotrigine cog complaints Lithium remotely  At visit November 12, 2019. We discussed Patient developed an increasingly severe alcohol dependence problem since her last visit in January.  She went to [facility] and has had no alcohol since then except 1 day.  She never abused stimulants but they took her off the stimulants at [facility].  Her ADD was markedly worse.  The Wellbutrin did not helped the ADD.   Review of Systems:  Review of Systems  Constitutional:  Positive for unexpected weight change.  Cardiovascular:  Negative for palpitations.  Musculoskeletal:  Positive for arthralgias, back pain and joint swelling.       SP hip surgery October 2020  Neurological:  Negative for tremors and weakness.  Psychiatric/Behavioral:  Negative for agitation, behavioral problems, confusion, decreased concentration, dysphoric mood, hallucinations, self-injury, sleep disturbance and suicidal ideas. The patient is not nervous/anxious and is not hyperactive.    Medications: I have reviewed the patient's current medications.  Current Outpatient Medications  Medication Sig Dispense Refill   buPROPion (WELLBUTRIN XL) 150 MG 24 hr tablet TAKE 3 TABLETS (450 MG TOTAL) BY MOUTH DAILY. 270 tablet 0   FLUoxetine (PROZAC) 20 MG capsule TAKE 1 CAPSULE BY MOUTH EVERY DAY (Patient taking differently: Pt takes 3 capsules a day.) 90 capsule 1   iron polysaccharides (NIFEREX) 150 MG capsule TAKE 1 CAPSULE BY MOUTH EVERY DAY 30 capsule 1   risperiDONE (RISPERDAL) 2 MG tablet Take 1 tablet (2 mg total)  by mouth at bedtime. 30 tablet 0   traZODone (DESYREL) 50 MG tablet TAKE 1 TABLET (50 MG TOTAL) BY MOUTH AT BEDTIME AS NEEDED FOR SLEEP. 90 tablet 2   amphetamine-dextroamphetamine (ADDERALL XR) 15 MG 24 hr capsule Take 1 capsule by mouth daily. (Patient not taking: Reported on 05/04/2021) 30 capsule 0   No current facility-administered medications for this visit.    Medication Side Effects: None  Allergies:  Allergies  Allergen Reactions   Metronidazole Shortness Of Breath and Other (See Comments)    Heart pounding   Ferrlecit [Na Ferric Gluc Cplx In Sucrose] Other (See Comments)    Infusion reaction 05/12/2019    Past Medical History:  Diagnosis Date   ADHD    Anemia    Anxiety    Arthritis    Depression    Heart murmur    i went to see a cardiologit slast eyar  and i had zero plaque,    PONV (postoperative nausea and vomiting)  Recovering alcoholic in remission (HCC)     Family History  Problem Relation Age of Onset   Atrial fibrillation Mother    CAD Father     Social History   Socioeconomic History   Marital status: Married    Spouse name: Not on file   Number of children: Not on file   Years of education: Not on file   Highest education level: Not on file  Occupational History   Not on file  Tobacco Use   Smoking status: Former    Pack years: 0.00    Types: Cigarettes    Quit date: 08/16/2003    Years since quitting: 17.7   Smokeless tobacco: Never   Tobacco comments:     08-28-2019 "i smoked 2 cigarettes in the last month since my father  passed"  Vaping Use   Vaping Use: Never used  Substance and Sexual Activity   Alcohol use: Yes    Alcohol/week: 10.0 standard drinks    Types: 10 Glasses of wine per week   Drug use: No   Sexual activity: Not on file  Other Topics Concern   Not on file  Social History Narrative   Not on file   Social Determinants of Health   Financial Resource Strain: Not on file  Food Insecurity: Not on file   Transportation Needs: Not on file  Physical Activity: Not on file  Stress: Not on file  Social Connections: Not on file  Intimate Partner Violence: Not on file    Past Medical History, Surgical history, Social history, and Family history were reviewed and updated as appropriate.   Please see review of systems for further details on the patient's review from today.   Objective:   Physical Exam:  There were no vitals taken for this visit.  Physical Exam Constitutional:      General: She is not in acute distress. Musculoskeletal:        General: No deformity.  Neurological:     Mental Status: She is alert and oriented to person, place, and time.     Coordination: Coordination normal.     Gait: Gait normal.  Psychiatric:        Attention and Perception: Perception normal. She is attentive.        Mood and Affect: Mood is not anxious or depressed. Affect is not labile, blunt, angry or inappropriate.        Speech: Speech is rapid and pressured and tangential. Speech is not slurred.        Behavior: Behavior is hyperactive. Behavior is not agitated, slowed or aggressive.        Thought Content: Thought content normal. Thought content is not paranoid. Thought content does not include homicidal or suicidal ideation. Thought content does not include homicidal or suicidal plan.        Cognition and Memory: Cognition normal.        Judgment: Judgment normal.     Comments: Insight intact. No auditory or visual hallucinations. No delusions.  Very hyperverbal but can be directed.  Increased emotional intensity and pressure. Classic euphoric mania    Lab Review:     Component Value Date/Time   NA 137 01/12/2021 1430   NA 140 11/18/2018 1544   K 3.8 01/12/2021 1430   CL 108 01/12/2021 1430   CO2 22 01/12/2021 1430   GLUCOSE 94 01/12/2021 1430   BUN 14 01/12/2021 1430   BUN 20 11/18/2018 1544   CREATININE 0.82 01/12/2021 1430  CALCIUM 8.9 01/12/2021 1430   PROT 6.6 01/12/2021  1430   ALBUMIN 3.9 01/12/2021 1430   AST 12 (L) 01/12/2021 1430   ALT 11 01/12/2021 1430   ALKPHOS 46 01/12/2021 1430   BILITOT 0.5 01/12/2021 1430   GFRNONAA >60 01/12/2021 1430   GFRAA >60 09/02/2019 0249   GFRAA >60 01/27/2019 0811       Component Value Date/Time   WBC 4.5 01/12/2021 1430   RBC 4.32 01/12/2021 1430   HGB 12.8 01/12/2021 1430   HGB 12.9 07/17/2019 0953   HCT 38.5 01/12/2021 1430   HCT 21.9 (L) 12/25/2018 1221   PLT 272 01/12/2021 1430   PLT 286 07/17/2019 0953   MCV 89.1 01/12/2021 1430   MCH 29.6 01/12/2021 1430   MCHC 33.2 01/12/2021 1430   RDW 12.4 01/12/2021 1430   LYMPHSABS 1.4 01/12/2021 1430   MONOABS 0.4 01/12/2021 1430   EOSABS 0.0 01/12/2021 1430   BASOSABS 0.0 01/12/2021 1430    No results found for: POCLITH, LITHIUM   No results found for: PHENYTOIN, PHENOBARB, VALPROATE, CBMZ   .res Assessment: Plan:    Bipolar 1 disorder, manic, moderate (HCC)  Attention deficit hyperactivity disorder (ADHD), predominantly inattentive type  Generalized anxiety disorder  Insomnia due to mental condition  Alcohol use disorder, moderate, in early remission, dependence (HCC)   Greater than 50% of 60 min face to face time with patient was spent on counseling and coordination of care. We discussed how she is seen urgently today referred by her therapist because her therapist believes she is manic.  The patient admits that she has some mania.  We have had the conversation previously about the possibility that this could be the case.  She very remotely took lithium prior to coming to our practice in July 1998.  She did not like the lithium.  Otherwise she has not taken mood stabilizers since being in our care other than lamotrigine for a period of time.  We discussed the need to try to get the mania under control as quickly as possible and then we could make decisions about maintenance medications thereafter.  She would like to avoid excessive weight gain  after having worked hard to lose weight.  We will start with risperidone and may switch it to an alternative mood stabilizer at a later point.  She agrees to start risperidone 1 mg nightly and if she sees no significant benefit in the next few days to go to 2 mg nightly. If her symptoms got much worse she may require hospitalization.  At this time there is no evidence of imminent dangerousness to herself or others.  Requested that she call back next week to give [place] a report of how she is doing with the medication.  Patient has been under my care for many years.  There is no history of stimulant abuse and no other substance abuse including no marijuana abuse.  We discussed the pros and cons of potentially restarting the Adderall because her ADD is out of control.  Alcohol is a sedative and Adderall as a stimulant so they produce markedly different effects.  I do not believe the patient is a significant risk for abusing Adderall nor do I believe the patient is likely to experience craving for alcohol if she uses Adderall.  There is been no abuse of Adderall and she is remained sober from alcohol. Adderall can contribute to mania but she has taken Adderall for many years and feels it is medically necessary  to manage her ADD and allow her to be productive in her work.  We will allow her to continue it at this time.  Discussed potential benefits, risks, and side effects of stimulants with patient to include increased heart rate, palpitations, insomnia, increased anxiety, increased irritability, or decreased appetite.  Instructed patient to contact office if experiencing any significant tolerability issues.  Maintain sobriety  Check BP  Recognizes effect of anemia on ADD and mood.  Had iron infusions last winter.  Continue Prozac 20 mg daily.  Consider stopping it because it can feel the mania however she is reluctant to do that because she fears relapse of depression. May have to increase the dose at  some point in the future given that she usually was taking higher dosages but she is getting good response at this time. Continue  Wellbutrin  450 mg AM.  Historically fluoxetine was more effective than other antidepressants.  She was last taking 60 mg a day but she may not need this much since she has been off of it for several months and she is not drinking now.  She may need to increase fluoxetine at follow-up.  No further prescriptions for benzodiazepines.  She agrees.  FU 2 to 3-week  Meredith Staggersarey Cottle, MD, DFAPA     Please see After Visit Summary for patient specific instructions.  Future Appointments  Date Time Provider Department Center  07/25/2021  4:00 PM Cottle, Steva Readyarey G Jr., MD [facility] None    No orders of the defined types were placed in this encounter.     -------------------------------

## 2021-05-10 ENCOUNTER — Encounter: Payer: Self-pay | Admitting: Psychiatry

## 2021-05-12 ENCOUNTER — Other Ambulatory Visit: Payer: Self-pay | Admitting: Hematology

## 2021-05-15 ENCOUNTER — Other Ambulatory Visit: Payer: Self-pay | Admitting: Psychiatry

## 2021-05-15 DIAGNOSIS — F411 Generalized anxiety disorder: Secondary | ICD-10-CM

## 2021-05-15 DIAGNOSIS — F331 Major depressive disorder, recurrent, moderate: Secondary | ICD-10-CM

## 2021-05-23 ENCOUNTER — Other Ambulatory Visit: Payer: Self-pay

## 2021-05-23 ENCOUNTER — Telehealth: Payer: Self-pay | Admitting: Psychiatry

## 2021-05-23 MED ORDER — RISPERIDONE 2 MG PO TABS: 2.0000 mg | ORAL_TABLET | Freq: Every day | ORAL | 0 refills | Status: DC

## 2021-05-23 NOTE — Telephone Encounter (Signed)
Next visit is 07/25/21. Last refill 05/04/21. Requesting refill on Risperidone called to:  [id number] - [place], [place] - [address] AT [facility] [address]  Phone:  [phone]  Fax:  [phone]

## 2021-06-07 ENCOUNTER — Telehealth: Payer: Self-pay | Admitting: Psychiatry

## 2021-06-07 ENCOUNTER — Other Ambulatory Visit: Payer: Self-pay | Admitting: Psychiatry

## 2021-06-07 DIAGNOSIS — F900 Attention-deficit hyperactivity disorder, predominantly inattentive type: Secondary | ICD-10-CM

## 2021-06-07 MED ORDER — AMPHETAMINE-DEXTROAMPHET ER 10 MG PO CP24: 10.0000 mg | ORAL_CAPSULE | Freq: Every day | ORAL | 0 refills | Status: DC

## 2021-06-07 NOTE — Telephone Encounter (Signed)
Pt left a message stating that she needs a refill on her adderall. She said dr. Jennelle Human lowered the dose to 15 mg and she would like a refill on that dose or go even lower to 10 mg if the doctor approves it.

## 2021-06-07 NOTE — Telephone Encounter (Signed)
RX sent

## 2021-06-07 NOTE — Telephone Encounter (Signed)
Please review

## 2021-06-14 ENCOUNTER — Other Ambulatory Visit: Payer: Self-pay | Admitting: Psychiatry

## 2021-06-14 ENCOUNTER — Other Ambulatory Visit: Payer: Self-pay | Admitting: Hematology

## 2021-06-14 DIAGNOSIS — F331 Major depressive disorder, recurrent, moderate: Secondary | ICD-10-CM

## 2021-06-14 DIAGNOSIS — F411 Generalized anxiety disorder: Secondary | ICD-10-CM

## 2021-06-16 ENCOUNTER — Other Ambulatory Visit: Payer: Self-pay

## 2021-06-16 ENCOUNTER — Encounter: Payer: Self-pay | Admitting: Psychiatry

## 2021-06-16 ENCOUNTER — Ambulatory Visit (INDEPENDENT_AMBULATORY_CARE_PROVIDER_SITE_OTHER): Payer: 59 | Admitting: Psychiatry

## 2021-06-16 VITALS — BP 147/95 | HR 58

## 2021-06-16 DIAGNOSIS — F411 Generalized anxiety disorder: Secondary | ICD-10-CM | POA: Diagnosis not present

## 2021-06-16 DIAGNOSIS — F900 Attention-deficit hyperactivity disorder, predominantly inattentive type: Secondary | ICD-10-CM | POA: Diagnosis not present

## 2021-06-16 DIAGNOSIS — F1021 Alcohol dependence, in remission: Secondary | ICD-10-CM | POA: Diagnosis not present

## 2021-06-16 DIAGNOSIS — F3112 Bipolar disorder, current episode manic without psychotic features, moderate: Secondary | ICD-10-CM | POA: Diagnosis not present

## 2021-06-16 MED ORDER — FLUOXETINE HCL 10 MG PO CAPS
10.0000 mg | ORAL_CAPSULE | Freq: Every day | ORAL | 0 refills | Status: DC
Start: 2021-06-16 — End: 2021-07-25

## 2021-06-16 MED ORDER — RISPERIDONE 0.5 MG PO TABS: 1.5000 mg | ORAL_TABLET | Freq: Every day | ORAL | 1 refills | Status: DC

## 2021-06-16 MED ORDER — AMPHETAMINE-DEXTROAMPHET ER 10 MG PO CP24: 10.0000 mg | ORAL_CAPSULE | Freq: Every day | ORAL | 0 refills | Status: DC

## 2021-06-16 NOTE — Progress Notes (Signed)
Laura Chang [id number] 1956-07-15 65 y.o.  Subjective:   Patient ID:  Laura Chang is a 65 y.o. (DOB August 07, 1956) female.  Chief Complaint:  Chief Complaint  Patient presents with   Follow-up   Depression   Manic Behavior   ADHD    HPI Laura Chang presents to the office today for follow-up of depression and anxiety and ADD.  seen November 12, 2019.  Melted down in 2020.  Went to [facility] in July.  No withdrawal.  1 drink since then.  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 She was on no stimulant and no SSRI but was taking Strattera and Wellbutrin.  The following changes were made. Stop Strattera. OK restart stimulant bc severe ADD Restart Adderall 1 daily for a few days and if tolerated then restart 1 twice daily. If not tolerated reduce the dosage if needed. May need to stop Wellbutrin if not tolerating the stimulant.  Yes.  DC Wellbutrin Restart Prozac 20 mg daily.  February 2021 appointment with the following noted: Completed grant proposal.  Couldn't doit without Adderall.  Sold a bunch of work.   Adderall XR lasts about 3 pm.  Strength seems about right.  BP been OK.  Not jittery.   Stopped Wellbutrin but had no SE. Mood drastically better with grant proposal and back on fluoxetine.  Less depressed and lethargic.  No anxiety.  Cut back on coffee. Started back with devotions and stronger faith. Plan: Continue Prozac 20 mg daily. May have to increase the dose at some point in the future given that she usually was taking higher dosages but she is getting good response at this time. Restart Wellbutrin off label for ADD since can't get 2 ADDERALL daily. 150 mg daily then 300 mg daily. She can adjust the dose between 150 mg and 300 mg daily to get the optimal effect.   05/11/2020 appointment with the following noted: Has been inconsistent with Prozac and Wellbutrin. Not sure of the effect of Wellbutrin. Biggest deterrent in work is  anxiety.  Some of the work is conceptual and difficult at times.  Can feel she's not up to a project at times.  Overall is OK but would like a steadier benefit from stimulant.  Exhausted from managing concentration and keeping up with things from the day.  Loses things.  Not good keeping up with schedule. Overall productive and emotionally OK. Can feel Adderall wear off. Mood is better in summer and worse in the winter.   F died in 10-10-2023 and that is a loss. No SE Wellbutrin. Still attends AA meetings.  Real benefit from [employer] last year. Recognizes effect of anemia on ADD and mood.  Had iron infusions last winter. Plan:  Wellbutrin off label for ADD since can't get 2 ADDERALL daily. 150 mg daily then 300 mg daily.  01/24/2021 appointment with following noted: Doing a program called [employer] mindfulness app since Xmas.  CBT app helped the depression.  App helped her focus better.  Lost sign weight. Writing a lot. Before Xmas felt depressed and started negative thinking worse, self denigrating. Not drinking. More isolated.   Recognizes mo is narcissist.    Didn't tell anyone she was born until 3 mos later.  M aloof and uninterested in pt.  Lied about her birthday.  Mo lack of affection even with pt's kids. Going to AA for a year and it helped her to quit drinking. Also misses kids being gone  with a hole also. Plan: No med changes  05/04/2021 appointment with the following noted: Therapist [name] thinks she's manic. Lost weight to 144#.   States she is still sleeping okay.  Admits she is hyper and recognizes that she is likely manic.  She feels great, euphoric with an increased sense of spiritual connectedness to God.  She has racing thoughts and talks fast and talks a lot and this is noted by her husband.  He thinks she is a bit hyper.  She has been able to maintain sobriety although she will have 1 glass of wine on special occasions but does not drink by herself.  She is not  drinking to excess.  She denies any dangerous impulsivity.  She is clearly not depressed and not particularly anxious.  She has no concerns about her medication and she has been compliant.  06/16/21 appt noted: So much better.  Going through a lot but the manic thing happened on top of it.  So much slower.  Didn't feel like losing anything with risperidone.  Likes the Adderalll at 10 mg. Some drowsiness in the AM and very drowsy from risperidone 2 mg HS. Prayer life is better. Handling stress better. Less depressed with risperidone. Still likes trazodone. Sleeps well.   D Middie most like her.  Past Psychiatric Medication Trials: fluoxetine, duloxetine, Viibryd, lamotrigine, Pristiq, sertraline, citalopram Adderall, Adderall XR, Vyvanse, Ritalin, Strattera low dose NR Lorazepam Trazodone Wellbutrin Depakote, lamotrigine cog complaints Lithium remotely  At visit November 12, 2019. We discussed Patient developed an increasingly severe alcohol dependence problem since her last visit in January.  She went to [facility] and has had no alcohol since then except 1 day.  She never abused stimulants but they took her off the stimulants at [facility].  Her ADD was markedly worse.  The Wellbutrin did not helped the ADD.   Review of Systems:  Review of Systems  Constitutional:  Positive for unexpected weight change.  Cardiovascular:  Negative for palpitations.  Musculoskeletal:  Positive for arthralgias, back pain and joint swelling.       SP hip surgery October 2020  Neurological:  Negative for tremors and weakness.  Psychiatric/Behavioral:  Negative for agitation, behavioral problems, confusion, decreased concentration, dysphoric mood, hallucinations, self-injury, sleep disturbance and suicidal ideas. The patient is not nervous/anxious and is not hyperactive.    Medications: I have reviewed the patient's current medications.  Current Outpatient Medications  Medication Sig Dispense  Refill   buPROPion (WELLBUTRIN XL) 150 MG 24 hr tablet TAKE 3 TABLETS (450 MG TOTAL) BY MOUTH DAILY. 270 tablet 0   iron polysaccharides (NIFEREX) 150 MG capsule TAKE 1 CAPSULE BY MOUTH EVERY DAY 30 capsule 1   traZODone (DESYREL) 50 MG tablet TAKE 1 TABLET (50 MG TOTAL) BY MOUTH AT BEDTIME AS NEEDED FOR SLEEP. 90 tablet 2   amphetamine-dextroamphetamine (ADDERALL XR) 10 MG 24 hr capsule Take 1 capsule (10 mg total) by mouth daily. 30 capsule 0   FLUoxetine (PROZAC) 10 MG capsule Take 1 capsule (10 mg total) by mouth daily. 90 capsule 0   risperiDONE (RISPERDAL) 0.5 MG tablet Take 3 tablets (1.5 mg total) by mouth at bedtime. 90 tablet 1   No current facility-administered medications for this visit.    Medication Side Effects: None  Allergies:  Allergies  Allergen Reactions   Metronidazole Shortness Of Breath and Other (See Comments)    Heart pounding   Ferrlecit [Na Ferric Gluc Cplx In Sucrose] Other (See Comments)  Infusion reaction 05/12/2019    Past Medical History:  Diagnosis Date   ADHD    Anemia    Anxiety    Arthritis    Depression    Heart murmur    i went to see a cardiologit slast eyar  and i had zero plaque,    PONV (postoperative nausea and vomiting)    Recovering alcoholic in remission (HCC)     Family History  Problem Relation Age of Onset   Atrial fibrillation Mother    CAD Father     Social History   Socioeconomic History   Marital status: Married    Spouse name: Not on file   Number of children: Not on file   Years of education: Not on file   Highest education level: Not on file  Occupational History   Not on file  Tobacco Use   Smoking status: Former    Types: Cigarettes    Quit date: 08/16/2003    Years since quitting: 17.8   Smokeless tobacco: Never   Tobacco comments:     08-28-2019 "i smoked 2 cigarettes in the last month since my father  passed"  Vaping Use   Vaping Use: Never used  Substance and Sexual Activity   Alcohol use: Yes     Alcohol/week: 10.0 standard drinks    Types: 10 Glasses of wine per week   Drug use: No   Sexual activity: Not on file  Other Topics Concern   Not on file  Social History Narrative   Not on file   Social Determinants of Health   Financial Resource Strain: Not on file  Food Insecurity: Not on file  Transportation Needs: Not on file  Physical Activity: Not on file  Stress: Not on file  Social Connections: Not on file  Intimate Partner Violence: Not on file    Past Medical History, Surgical history, Social history, and Family history were reviewed and updated as appropriate.   Please see review of systems for further details on the patient's review from today.   Objective:   Physical Exam:  BP (!) 147/95   Pulse (!) 58   Physical Exam Constitutional:      General: She is not in acute distress. Musculoskeletal:        General: No deformity.  Neurological:     Mental Status: She is alert and oriented to person, place, and time.     Coordination: Coordination normal.     Gait: Gait normal.  Psychiatric:        Attention and Perception: Perception normal. She is attentive.        Mood and Affect: Mood is not anxious or depressed. Affect is not labile, blunt, angry or inappropriate.        Speech: Speech is not rapid and pressured, slurred or tangential.        Behavior: Behavior is not agitated, slowed or aggressive.        Thought Content: Thought content normal. Thought content is not paranoid. Thought content does not include homicidal or suicidal ideation. Thought content does not include homicidal or suicidal plan.        Cognition and Memory: Cognition normal.        Judgment: Judgment normal.     Comments: Insight intact. No auditory or visual hallucinations. No delusions. Much less hyperverbal.  Pressure resolved. Classic euphoric mania resolved    Lab Review:     Component Value Date/Time   NA 137 01/12/2021 1430  NA 140 11/18/2018 1544   K 3.8  01/12/2021 1430   CL 108 01/12/2021 1430   CO2 22 01/12/2021 1430   GLUCOSE 94 01/12/2021 1430   BUN 14 01/12/2021 1430   BUN 20 11/18/2018 1544   CREATININE 0.82 01/12/2021 1430   CALCIUM 8.9 01/12/2021 1430   PROT 6.6 01/12/2021 1430   ALBUMIN 3.9 01/12/2021 1430   AST 12 (L) 01/12/2021 1430   ALT 11 01/12/2021 1430   ALKPHOS 46 01/12/2021 1430   BILITOT 0.5 01/12/2021 1430   GFRNONAA >60 01/12/2021 1430   GFRAA >60 09/02/2019 0249   GFRAA >60 01/27/2019 0811       Component Value Date/Time   WBC 4.5 01/12/2021 1430   RBC 4.32 01/12/2021 1430   HGB 12.8 01/12/2021 1430   HGB 12.9 07/17/2019 0953   HCT 38.5 01/12/2021 1430   HCT 21.9 (L) 12/25/2018 1221   PLT 272 01/12/2021 1430   PLT 286 07/17/2019 0953   MCV 89.1 01/12/2021 1430   MCH 29.6 01/12/2021 1430   MCHC 33.2 01/12/2021 1430   RDW 12.4 01/12/2021 1430   LYMPHSABS 1.4 01/12/2021 1430   MONOABS 0.4 01/12/2021 1430   EOSABS 0.0 01/12/2021 1430   BASOSABS 0.0 01/12/2021 1430    No results found for: POCLITH, LITHIUM   No results found for: PHENYTOIN, PHENOBARB, VALPROATE, CBMZ   .res Assessment: Plan:    Bipolar 1 disorder, manic, moderate (HCC) - Plan: risperiDONE (RISPERDAL) 0.5 MG tablet  Attention deficit hyperactivity disorder (ADHD), predominantly inattentive type - Plan: amphetamine-dextroamphetamine (ADDERALL XR) 10 MG 24 hr capsule  Generalized anxiety disorder - Plan: FLUoxetine (PROZAC) 10 MG capsule  Alcohol use disorder, moderate, in early remission, dependence (HCC)   Greater than 50% of 45 min face to face time with patient was spent on counseling and coordination of care. We discussed how she is seen urgently recently referred by her therapist because her therapist believes she is manic.  The patient admitted mania.  We have had the conversation previously about the possibility that this could be the case.  She very remotely took lithium prior to coming to our practice in July 1998.  She  did not like the lithium.  Otherwise she has not taken mood stabilizers since being in our care other than lamotrigine for a period of time.  We discussed the need to try to get the mania under control as quickly as possible and then we could make decisions about maintenance medications thereafter.  She would like to avoid excessive weight gain after having worked hard to lose weight.   Risperidone 2 mg has resolved the mania but she feels just a little bit sedated and a little increased appetite which she would like to see resolved.  Her depression is actually improved her anxiety is improved.  She no longer feels pressured and is able to think clearly.  She still feels quite good with regard to mood but not over the top. Reduce risperidone to 1.5 mg nightly due to side effects.  Discussed risk of worsening mania.  Discussed potential metabolic side effects associated with atypical antipsychotics, as well as potential risk for movement side effects. Advised pt to contact office if movement side effects occur.   Patient has been under my care for many years.  There is no history of stimulant abuse and no other substance abuse including no marijuana abuse.  We discussed the pros and cons of potentially restarting the Adderall because her ADD is out of  control.  Alcohol is a sedative and Adderall as a stimulant so they produce markedly different effects.  I do not believe the patient is a significant risk for abusing Adderall nor do I believe the patient is likely to experience craving for alcohol if she uses Adderall.  There is been no abuse of Adderall and she is remained sober from alcohol. Adderall can contribute to mania but she has taken Adderall for many years and feels it is medically necessary to manage her ADD and allow her to be productive in her work.  We will allow her to continue it at this time. She still feels benefit from low-dose Adderall XR 10 mg every morning.  Discussed potential  benefits, risks, and side effects of stimulants with patient to include increased heart rate, palpitations, insomnia, increased anxiety, increased irritability, or decreased appetite.  Instructed patient to contact office if experiencing any significant tolerability issues.  Has Maintain sobriety  Check BP  Recognizes effect of anemia on ADD and mood.  Had iron infusions last winter.  Reduce Prozac to 10 mg daily.  Consider stopping it because it can feel the mania however she is reluctant to do that because she fears relapse of depression. May have to increase the dose at some point in the future given that she usually was taking higher dosages but she is getting good response at this time. Continue  Wellbutrin  450 mg AM.  No further prescriptions for benzodiazepines.  She agrees.  FU 4-week  Meredith Staggers, MD, DFAPA     Please see After Visit Summary for patient specific instructions.  Future Appointments  Date Time Provider Department Center  07/25/2021  4:00 PM Cottle, Steva Ready., MD [facility] None    No orders of the defined types were placed in this encounter.     -------------------------------

## 2021-06-18 ENCOUNTER — Other Ambulatory Visit: Payer: Self-pay | Admitting: Psychiatry

## 2021-06-18 DIAGNOSIS — F411 Generalized anxiety disorder: Secondary | ICD-10-CM

## 2021-06-18 DIAGNOSIS — F331 Major depressive disorder, recurrent, moderate: Secondary | ICD-10-CM

## 2021-07-13 ENCOUNTER — Other Ambulatory Visit: Payer: Self-pay | Admitting: Hematology

## 2021-07-13 ENCOUNTER — Other Ambulatory Visit: Payer: Self-pay | Admitting: Psychiatry

## 2021-07-13 DIAGNOSIS — F3112 Bipolar disorder, current episode manic without psychotic features, moderate: Secondary | ICD-10-CM

## 2021-07-18 ENCOUNTER — Telehealth: Payer: Self-pay | Admitting: Psychiatry

## 2021-07-18 NOTE — Telephone Encounter (Signed)
Please see above message.Rtc to denise and she stated the Risperdal is making her more depressed.She also does not like Wellbutrin and states it is not helping her at all.She does not want to take it anymore.She said her manic episode is over and she feels she needs to be on Prozac and adderall again.She was tearful and stated she can't uplift herself from being sad.

## 2021-07-18 NOTE — Telephone Encounter (Signed)
Return telephone call regarding her post manic depression.  No answer and had to leave the following message on her personal cell phone: This is not due to medicine per se its a complication of having had recent mania.  The solution is not simple but involves a decision around a variety of options so would prefer to make the decision and person at an appointment.  She has an appointment scheduled for a week from today.  Suggested that we wait and make the decision at that appointment but she can certainly call back with any emergencies.

## 2021-07-18 NOTE — Telephone Encounter (Signed)
Pt left a message stating that her meds were tweaked and she feels like they need to be tweaked again. She said that is larthegic, depressed. She would like to go down on the duloxetine  to 20 mg and adderall  20 mg. She feels like the new med is not working.Please give her a call at [phone]

## 2021-07-21 NOTE — Telephone Encounter (Signed)
Keep her on the waiting list to see if we can get her in sooner, but call her and schedule her for next Friday as a work in just in case.

## 2021-07-25 ENCOUNTER — Other Ambulatory Visit: Payer: Self-pay

## 2021-07-25 ENCOUNTER — Ambulatory Visit (INDEPENDENT_AMBULATORY_CARE_PROVIDER_SITE_OTHER): Payer: 59 | Admitting: Psychiatry

## 2021-07-25 ENCOUNTER — Encounter: Payer: Self-pay | Admitting: Psychiatry

## 2021-07-25 VITALS — BP 112/77 | HR 94

## 2021-07-25 DIAGNOSIS — F5105 Insomnia due to other mental disorder: Secondary | ICD-10-CM

## 2021-07-25 DIAGNOSIS — F3112 Bipolar disorder, current episode manic without psychotic features, moderate: Secondary | ICD-10-CM | POA: Diagnosis not present

## 2021-07-25 DIAGNOSIS — F900 Attention-deficit hyperactivity disorder, predominantly inattentive type: Secondary | ICD-10-CM

## 2021-07-25 DIAGNOSIS — F411 Generalized anxiety disorder: Secondary | ICD-10-CM

## 2021-07-25 DIAGNOSIS — F3130 Bipolar disorder, current episode depressed, mild or moderate severity, unspecified: Secondary | ICD-10-CM | POA: Diagnosis not present

## 2021-07-25 MED ORDER — FLUOXETINE HCL 20 MG PO CAPS: 20.0000 mg | ORAL_CAPSULE | Freq: Every day | ORAL | 0 refills | Status: DC

## 2021-07-25 MED ORDER — AMPHETAMINE-DEXTROAMPHET ER 20 MG PO CP24: 20.0000 mg | ORAL_CAPSULE | ORAL | 0 refills | Status: DC

## 2021-07-25 MED ORDER — ARIPIPRAZOLE 15 MG PO TABS: 15.0000 mg | ORAL_TABLET | Freq: Every day | ORAL | 0 refills | Status: DC

## 2021-07-25 NOTE — Patient Instructions (Addendum)
Increase fluoxetine to 20 mg daily Add Abilify 1/2 of 15 mg tablet daily Wean wellbutrin by 1 tablet each week Reduce risperidone to 1 daily for 1 week and stop it. Increase Adderall to XR 20 mg AM

## 2021-07-25 NOTE — Progress Notes (Signed)
Laura Chang [id number] Aug 19, 1956 65 y.o.  Subjective:   Patient ID:  Laura Chang is a 65 y.o. (DOB Oct 10, 1956) female.  Chief Complaint:  Chief Complaint  Patient presents with   Follow-up   Depression   ADHD    HPI [name] presents to the office today for follow-up of depression and anxiety and ADD.  seen November 12, 2019.  Melted down in 2020.  Went to [facility] in July.  No withdrawal.  1 drink since then.  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 She was on no stimulant and no SSRI but was taking Strattera and Wellbutrin.  The following changes were made. Stop Strattera. OK restart stimulant bc severe ADD Restart Adderall 1 daily for a few days and if tolerated then restart 1 twice daily. If not tolerated reduce the dosage if needed. May need to stop Wellbutrin if not tolerating the stimulant.  Yes.  DC Wellbutrin Restart Prozac 20 mg daily.  February 2021 appointment with the following noted: Completed grant proposal.  Couldn't doit without Adderall.  Sold a bunch of work.   Adderall XR lasts about 3 pm.  Strength seems about right.  BP been OK.  Not jittery.   Stopped Wellbutrin but had no SE. Mood drastically better with grant proposal and back on fluoxetine.  Less depressed and lethargic.  No anxiety.  Cut back on coffee. Started back with devotions and stronger faith. Plan: Continue Prozac 20 mg daily. May have to increase the dose at some point in the future given that she usually was taking higher dosages but she is getting good response at this time. Restart Wellbutrin off label for ADD since can't get 2 ADDERALL daily. 150 mg daily then 300 mg daily. She can adjust the dose between 150 mg and 300 mg daily to get the optimal effect.   05/11/2020 appointment with the following noted: Has been inconsistent with Prozac and Wellbutrin. Not sure of the effect of Wellbutrin. Biggest deterrent in work is anxiety.  Some of the  work is conceptual and difficult at times.  Can feel she's not up to a project at times.  Overall is OK but would like a steadier benefit from stimulant.  Exhausted from managing concentration and keeping up with things from the day.  Loses things.  Not good keeping up with schedule. Overall productive and emotionally OK. Can feel Adderall wear off. Mood is better in summer and worse in the winter.   F died in 2023-10-24 and that is a loss. No SE Wellbutrin. Still attends AA meetings.  Real benefit from [facility] last year. Recognizes effect of anemia on ADD and mood.  Had iron infusions last winter. Plan:  Wellbutrin off label for ADD since can't get 2 ADDERALL daily. 150 mg daily then 300 mg daily.  01/24/2021 appointment with following noted: Doing a program called [employer] mindfulness app since Xmas.  CBT app helped the depression.  App helped her focus better.  Lost sign weight. Writing a lot. Before Xmas felt depressed and started negative thinking worse, self denigrating. Not drinking. More isolated.   Recognizes mo is narcissist.    Didn't tell anyone she was born until 3 mos later.  M aloof and uninterested in pt.  Lied about her birthday.  Mo lack of affection even with pt's kids. Going to AA for a year and it helped her to quit drinking. Also misses kids being gone with a hole also.  Plan: No med changes  05/04/2021 appointment with the following noted: Therapist [name] thinks she's manic. Lost weight to 144#.   States she is still sleeping okay.  Admits she is hyper and recognizes that she is likely manic.  She feels great, euphoric with an increased sense of spiritual connectedness to God.  She has racing thoughts and talks fast and talks a lot and this is noted by her husband.  He thinks she is a bit hyper.  She has been able to maintain sobriety although she will have 1 glass of wine on special occasions but does not drink by herself.  She is not drinking to excess.  She  denies any dangerous impulsivity.  She is clearly not depressed and not particularly anxious.  She has no concerns about her medication and she has been compliant.  06/16/21 appt noted: So much better.  Going through a lot but the manic thing happened on top of it.  So much slower.  Didn't feel like losing anything with risperidone.  Likes the Adderalll at 10 mg. Some drowsiness in the AM and very drowsy from risperidone 2 mg HS. Prayer life is better. Handling stress better. Less depressed with risperidone. Still likes trazodone. Sleeps well. Plan: Reduce Prozac to 10 mg daily.  Consider stopping it because it can feel the mania however she is reluctant to do that because she fears relapse of depression. Reduce risperidone to 1.5 mg nightly due to side effects.  Discussed risk of worsening mania.  07/25/2021 appointment with the following noted: Misses the Adderall and hard to function without it. Depressed now. Heavy chest.  Anxious and guilty.  Body feels heavy.   Hates Wellbutrin.    D Middie most like her.  Past Psychiatric Medication Trials: fluoxetine, duloxetine, Viibryd, lamotrigine, Pristiq, sertraline, citalopram Adderall, Adderall XR, Vyvanse, Ritalin, Strattera low dose NR Lorazepam Trazodone Wellbutrin Depakote, lamotrigine cog complaints Lithium remotely  At visit November 12, 2019. We discussed Patient developed an increasingly severe alcohol dependence problem since her last visit in January.  She went to [facility] and has had no alcohol since then except 1 day.  She never abused stimulants but they took her off the stimulants at [facility].  Her ADD was markedly worse.  The Wellbutrin did not helped the ADD.   Review of Systems:  Review of Systems  Constitutional:  Positive for unexpected weight change.  Musculoskeletal:  Positive for arthralgias, back pain and joint swelling.       SP hip surgery October 2020  Neurological:  Negative for tremors and  weakness.  Psychiatric/Behavioral:  Negative for agitation, behavioral problems, confusion, decreased concentration, dysphoric mood, hallucinations, self-injury, sleep disturbance and suicidal ideas. The patient is not nervous/anxious and is not hyperactive.    Medications: I have reviewed the patient's current medications.  Current Outpatient Medications  Medication Sig Dispense Refill   amphetamine-dextroamphetamine (ADDERALL XR) 10 MG 24 hr capsule Take 1 capsule (10 mg total) by mouth daily. 30 capsule 0   amphetamine-dextroamphetamine (ADDERALL XR) 20 MG 24 hr capsule Take 1 capsule (20 mg total) by mouth every morning. 30 capsule 0   ARIPiprazole (ABILIFY) 15 MG tablet Take 1 tablet (15 mg total) by mouth daily. 30 tablet 0   FLUoxetine (PROZAC) 20 MG capsule Take 1 capsule (20 mg total) by mouth daily. 30 capsule 0   iron polysaccharides (NIFEREX) 150 MG capsule TAKE 1 CAPSULE BY MOUTH EVERY DAY 30 capsule 1   risperiDONE (RISPERDAL) 0.5 MG  tablet TAKE 3 TABLETS (1.5 MG TOTAL) BY MOUTH AT BEDTIME. 270 tablet 0   traZODone (DESYREL) 50 MG tablet TAKE 1 TABLET (50 MG TOTAL) BY MOUTH AT BEDTIME AS NEEDED FOR SLEEP. 90 tablet 2   No current facility-administered medications for this visit.    Medication Side Effects: None  Allergies:  Allergies  Allergen Reactions   Metronidazole Shortness Of Breath and Other (See Comments)    Heart pounding   Ferrlecit [Na Ferric Gluc Cplx In Sucrose] Other (See Comments)    Infusion reaction 05/12/2019    Past Medical History:  Diagnosis Date   ADHD    Anemia    Anxiety    Arthritis    Depression    Heart murmur    i went to see a cardiologit slast eyar  and i had zero plaque,    PONV (postoperative nausea and vomiting)    Recovering alcoholic in remission (HCC)     Family History  Problem Relation Age of Onset   Atrial fibrillation Mother    CAD Father     Social History   Socioeconomic History   Marital status: Married     Spouse name: Not on file   Number of children: Not on file   Years of education: Not on file   Highest education level: Not on file  Occupational History   Not on file  Tobacco Use   Smoking status: Former    Types: Cigarettes    Quit date: 08/16/2003    Years since quitting: 17.9   Smokeless tobacco: Never   Tobacco comments:     08-28-2019 "i smoked 2 cigarettes in the last month since my father  passed"  Vaping Use   Vaping Use: Never used  Substance and Sexual Activity   Alcohol use: Yes    Alcohol/week: 10.0 standard drinks    Types: 10 Glasses of wine per week   Drug use: No   Sexual activity: Not on file  Other Topics Concern   Not on file  Social History Narrative   Not on file   Social Determinants of Health   Financial Resource Strain: Not on file  Food Insecurity: Not on file  Transportation Needs: Not on file  Physical Activity: Not on file  Stress: Not on file  Social Connections: Not on file  Intimate Partner Violence: Not on file    Past Medical History, Surgical history, Social history, and Family history were reviewed and updated as appropriate.   Please see review of systems for further details on the patient's review from today.   Objective:   Physical Exam:  BP 112/77   Pulse 94   Physical Exam Constitutional:      General: She is not in acute distress. Musculoskeletal:        General: No deformity.  Neurological:     Mental Status: She is alert and oriented to person, place, and time.     Coordination: Coordination normal.     Gait: Gait normal.  Psychiatric:        Attention and Perception: Perception normal. She is attentive.        Mood and Affect: Mood is depressed. Mood is not anxious. Affect is not labile, blunt, angry or inappropriate.        Speech: Speech is not rapid and pressured, slurred or tangential.        Behavior: Behavior is not agitated, slowed or aggressive.        Thought Content: Thought  content normal. Thought  content is not paranoid. Thought content does not include homicidal or suicidal ideation. Thought content does not include homicidal or suicidal plan.        Cognition and Memory: Cognition normal.        Judgment: Judgment normal.     Comments: Insight intact. No auditory or visual hallucinations. No delusions.     Lab Review:     Component Value Date/Time   NA 137 01/12/2021 1430   NA 140 11/18/2018 1544   K 3.8 01/12/2021 1430   CL 108 01/12/2021 1430   CO2 22 01/12/2021 1430   GLUCOSE 94 01/12/2021 1430   BUN 14 01/12/2021 1430   BUN 20 11/18/2018 1544   CREATININE 0.82 01/12/2021 1430   CALCIUM 8.9 01/12/2021 1430   PROT 6.6 01/12/2021 1430   ALBUMIN 3.9 01/12/2021 1430   AST 12 (L) 01/12/2021 1430   ALT 11 01/12/2021 1430   ALKPHOS 46 01/12/2021 1430   BILITOT 0.5 01/12/2021 1430   GFRNONAA >60 01/12/2021 1430   GFRAA >60 09/02/2019 0249   GFRAA >60 01/27/2019 0811       Component Value Date/Time   WBC 4.5 01/12/2021 1430   RBC 4.32 01/12/2021 1430   HGB 12.8 01/12/2021 1430   HGB 12.9 07/17/2019 0953   HCT 38.5 01/12/2021 1430   HCT 21.9 (L) 12/25/2018 1221   PLT 272 01/12/2021 1430   PLT 286 07/17/2019 0953   MCV 89.1 01/12/2021 1430   MCH 29.6 01/12/2021 1430   MCHC 33.2 01/12/2021 1430   RDW 12.4 01/12/2021 1430   LYMPHSABS 1.4 01/12/2021 1430   MONOABS 0.4 01/12/2021 1430   EOSABS 0.0 01/12/2021 1430   BASOSABS 0.0 01/12/2021 1430    No results found for: POCLITH, LITHIUM   No results found for: PHENYTOIN, PHENOBARB, VALPROATE, CBMZ   .res Assessment: Plan:    Bipolar 1 disorder, manic, moderate (HCC)  Bipolar I disorder, most recent episode depressed (HCC) - Plan: ARIPiprazole (ABILIFY) 15 MG tablet, FLUoxetine (PROZAC) 20 MG capsule  Attention deficit hyperactivity disorder (ADHD), predominantly inattentive type - Plan: amphetamine-dextroamphetamine (ADDERALL XR) 20 MG 24 hr capsule  Generalized anxiety disorder  Insomnia due to mental  condition   Greater than 50% of 30 min face to face time with patient was spent on counseling and coordination of care. We discussed how she is seen again urgently recently referred by her therapist because her therapist recognized mania.  The patient admitted mania.  Her mania was treated  and she has swung into a post-manic depression.  Extensive discusion with pt and her husband re: dx and treatment options.  In view of speed of recovery and use of generics, quickest likely response would be to use Abilify as a mood stabilizer in place of risperidone and increase fluoxetine back to 20 mg daily.  We discussed the risk that antidepressants can cause mood cycling or mania.  We discussed the risk that stimulants can cause mania.   Risperidone 2 mg resolved the mania.  Discussed potential metabolic side effects associated with atypical antipsychotics, as well as potential risk for movement side effects. Advised pt to contact office if movement side effects occur.   Discussed potential benefits, risks, and side effects of stimulants with patient to include increased heart rate, palpitations, insomnia, increased anxiety, increased irritability, or decreased appetite.  Instructed patient to contact office if experiencing any significant tolerability issues. She wants to return to usual dose of Adderall for ADD bc of mor  poor cognitive function with reduction.  Also discussed that depression will impair cognitive function.  Has Maintain sobriety  Recognizes effect of anemia on ADD and mood.  Had iron infusions last winter.  Increase fluoxetine to 20 mg daily Add Abilify 1/2 of 15 mg tablet daily Wean wellbutrin by 1 tablet each week  bc she feels it is not helpful and DT polypharmacy Reduce risperidone to 1 daily for 1 week and stop it. Disc risk of mania. Increase Adderall to XR 20 mg AM  No further prescriptions for benzodiazepines.  She agrees.  FU 2-week  Meredith Staggers, MD,  DFAPA     Please see After Visit Summary for patient specific instructions.  No future appointments.   No orders of the defined types were placed in this encounter.     -------------------------------

## 2021-07-27 ENCOUNTER — Telehealth: Payer: Self-pay | Admitting: Psychiatry

## 2021-07-27 NOTE — Telephone Encounter (Signed)
Pt called to advise Dr. Jennelle Human that the med switch he did is having positive results.  She just wanted to let him know.

## 2021-07-27 NOTE — Telephone Encounter (Signed)
FYI

## 2021-08-08 ENCOUNTER — Ambulatory Visit (INDEPENDENT_AMBULATORY_CARE_PROVIDER_SITE_OTHER): Payer: 59 | Admitting: Psychiatry

## 2021-08-08 ENCOUNTER — Other Ambulatory Visit: Payer: Self-pay

## 2021-08-08 ENCOUNTER — Encounter: Payer: Self-pay | Admitting: Psychiatry

## 2021-08-08 VITALS — BP 152/100 | HR 74

## 2021-08-08 DIAGNOSIS — F5105 Insomnia due to other mental disorder: Secondary | ICD-10-CM

## 2021-08-08 DIAGNOSIS — F3112 Bipolar disorder, current episode manic without psychotic features, moderate: Secondary | ICD-10-CM

## 2021-08-08 DIAGNOSIS — F411 Generalized anxiety disorder: Secondary | ICD-10-CM

## 2021-08-08 DIAGNOSIS — F900 Attention-deficit hyperactivity disorder, predominantly inattentive type: Secondary | ICD-10-CM | POA: Diagnosis not present

## 2021-08-08 DIAGNOSIS — F1021 Alcohol dependence, in remission: Secondary | ICD-10-CM

## 2021-08-08 NOTE — Progress Notes (Signed)
Laura Chang [id number] 01-13-1956 65 y.o.  Subjective:   Patient ID:  Laura Chang is a 65 y.o. (DOB Sep 05, 1956) female.  Chief Complaint:  Chief Complaint  Patient presents with   Follow-up   Depression   ADHD   Manic Behavior    HPI Laura Chang presents to the office today for follow-up of depression and anxiety and ADD.  seen November 12, 2019.  Melted down in 2020.  Went to [facility] in July.  No withdrawal.  1 drink since then.  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 She was on no stimulant and no SSRI but was taking Strattera and Wellbutrin.  The following changes were made. Stop Strattera. OK restart stimulant bc severe ADD Restart Adderall 1 daily for a few days and if tolerated then restart 1 twice daily. If not tolerated reduce the dosage if needed. May need to stop Wellbutrin if not tolerating the stimulant.  Yes.  DC Wellbutrin Restart Prozac 20 mg daily.  February 2021 appointment with the following noted: Completed grant proposal.  Couldn't doit without Adderall.  Sold a bunch of work.   Adderall XR lasts about 3 pm.  Strength seems about right.  BP been OK.  Not jittery.   Stopped Wellbutrin but had no SE. Mood drastically better with grant proposal and back on fluoxetine.  Less depressed and lethargic.  No anxiety.  Cut back on coffee. Started back with devotions and stronger faith. Plan: Continue Prozac 20 mg daily. May have to increase the dose at some point in the future given that she usually was taking higher dosages but she is getting good response at this time. Restart Wellbutrin off label for ADD since can't get 2 ADDERALL daily. 150 mg daily then 300 mg daily. She can adjust the dose between 150 mg and 300 mg daily to get the optimal effect.   05/11/2020 appointment with the following noted: Has been inconsistent with Prozac and Wellbutrin. Not sure of the effect of Wellbutrin. Biggest deterrent in work is  anxiety.  Some of the work is conceptual and difficult at times.  Can feel she's not up to a project at times.  Overall is OK but would like a steadier benefit from stimulant.  Exhausted from managing concentration and keeping up with things from the day.  Loses things.  Not good keeping up with schedule. Overall productive and emotionally OK. Can feel Adderall wear off. Mood is better in summer and worse in the winter.   F died in 11-Oct-2023 and that is a loss. No SE Wellbutrin. Still attends AA meetings.  Real benefit from [facility] last year. Recognizes effect of anemia on ADD and mood.  Had iron infusions last winter. Plan:  Wellbutrin off label for ADD since can't get 2 ADDERALL daily. 150 mg daily then 300 mg daily.  01/24/2021 appointment with following noted: Doing a program called [employer] mindfulness app since Xmas.  CBT app helped the depression.  App helped her focus better.  Lost sign weight. Writing a lot. Before Xmas felt depressed and started negative thinking worse, self denigrating. Not drinking. More isolated.   Recognizes mo is narcissist.    Didn't tell anyone she was born until 3 mos later.  M aloof and uninterested in pt.  Lied about her birthday.  Mo lack of affection even with pt's kids. Going to AA for a year and it helped her to quit drinking. Also misses kids being gone  with a hole also. Plan: No med changes  05/04/2021 appointment with the following noted: Therapist [name] thinks she's manic. Lost weight to 144#.   States she is still sleeping okay.  Admits she is hyper and recognizes that she is likely manic.  She feels great, euphoric with an increased sense of spiritual connectedness to God.  She has racing thoughts and talks fast and talks a lot and this is noted by her husband.  He thinks she is a bit hyper.  She has been able to maintain sobriety although she will have 1 glass of wine on special occasions but does not drink by herself.  She is not  drinking to excess.  She denies any dangerous impulsivity.  She is clearly not depressed and not particularly anxious.  She has no concerns about her medication and she has been compliant.  06/16/21 appt noted: So much better.  Going through a lot but the manic thing happened on top of it.  So much slower.  Didn't feel like losing anything with risperidone.  Likes the Adderalll at 10 mg. Some drowsiness in the AM and very drowsy from risperidone 2 mg HS. Prayer life is better. Handling stress better. Less depressed with risperidone. Still likes trazodone. Sleeps well. Plan: Reduce Prozac to 10 mg daily.  Consider stopping it because it can feel the mania however she is reluctant to do that because she fears relapse of depression. Reduce risperidone to 1.5 mg nightly due to side effects.  Discussed risk of worsening mania.  07/25/2021 appointment with the following noted: Misses the Adderall and hard to function without it. Depressed now. Heavy chest.  Anxious and guilty.  Body feels heavy.   Hates Wellbutrin.   Plan: Increase fluoxetine to 20 mg daily Add Abilify 1/2 of 15 mg tablet daily Wean wellbutrin by 1 tablet each week  bc she feels it is not helpful and DT polypharmacy Reduce risperidone to 1 daily for 1 week and stop it. Disc risk of mania. Increase Adderall to XR 20 mg AM  08/08/21 Much less depressed and starting to feel normal I feel a lot better. No SE.  Speech normal off risperidone. Sleeping OK on trazaodone and enough.   Noticed benefit from Adderall again.  D Middie most like her.  Past Psychiatric Medication Trials: fluoxetine, duloxetine, Viibryd, lamotrigine, Pristiq, sertraline, citalopram Adderall, Adderall XR, Vyvanse, Ritalin, Strattera low dose NR Lorazepam Trazodone Wellbutrin Depakote, lamotrigine cog complaints Lithium remotely  At visit November 12, 2019. We discussed Patient developed an increasingly severe alcohol dependence problem since her last visit  in January.  She went to [facility] and has had no alcohol since then except 1 day.  She never abused stimulants but they took her off the stimulants at [facility].  Her ADD was markedly worse.  The Wellbutrin did not helped the ADD.   Review of Systems:  Review of Systems  Constitutional:  Positive for unexpected weight change.  Musculoskeletal:  Positive for arthralgias, back pain and joint swelling.       SP hip surgery October 2020  Neurological:  Negative for tremors and weakness.  Psychiatric/Behavioral:  Negative for agitation, behavioral problems, confusion, decreased concentration, dysphoric mood, hallucinations, self-injury, sleep disturbance and suicidal ideas. The patient is not nervous/anxious and is not hyperactive.    Medications: I have reviewed the patient's current medications.  Current Outpatient Medications  Medication Sig Dispense Refill   amphetamine-dextroamphetamine (ADDERALL XR) 20 MG 24 hr capsule Take 1 capsule (  20 mg total) by mouth every morning. 30 capsule 0   ARIPiprazole (ABILIFY) 15 MG tablet Take 1 tablet (15 mg total) by mouth daily. (Patient taking differently: Take 15 mg by mouth daily. 1/2 tab daily) 30 tablet 0   FLUoxetine (PROZAC) 20 MG capsule Take 1 capsule (20 mg total) by mouth daily. 30 capsule 0   iron polysaccharides (NIFEREX) 150 MG capsule TAKE 1 CAPSULE BY MOUTH EVERY DAY 30 capsule 1   traZODone (DESYREL) 50 MG tablet TAKE 1 TABLET (50 MG TOTAL) BY MOUTH AT BEDTIME AS NEEDED FOR SLEEP. 90 tablet 2   No current facility-administered medications for this visit.    Medication Side Effects: None  Allergies:  Allergies  Allergen Reactions   Metronidazole Shortness Of Breath and Other (See Comments)    Heart pounding   Ferrlecit [Na Ferric Gluc Cplx In Sucrose] Other (See Comments)    Infusion reaction 05/12/2019    Past Medical History:  Diagnosis Date   ADHD    Anemia    Anxiety    Arthritis    Depression    Heart  murmur    i went to see a cardiologit slast eyar  and i had zero plaque,    PONV (postoperative nausea and vomiting)    Recovering alcoholic in remission (HCC)     Family History  Problem Relation Age of Onset   Atrial fibrillation Mother    CAD Father     Social History   Socioeconomic History   Marital status: Married    Spouse name: Not on file   Number of children: Not on file   Years of education: Not on file   Highest education level: Not on file  Occupational History   Not on file  Tobacco Use   Smoking status: Former    Types: Cigarettes    Quit date: 08/16/2003    Years since quitting: 17.9   Smokeless tobacco: Never   Tobacco comments:     08-28-2019 "i smoked 2 cigarettes in the last month since my father  passed"  Vaping Use   Vaping Use: Never used  Substance and Sexual Activity   Alcohol use: Yes    Alcohol/week: 10.0 standard drinks    Types: 10 Glasses of wine per week   Drug use: No   Sexual activity: Not on file  Other Topics Concern   Not on file  Social History Narrative   Not on file   Social Determinants of Health   Financial Resource Strain: Not on file  Food Insecurity: Not on file  Transportation Needs: Not on file  Physical Activity: Not on file  Stress: Not on file  Social Connections: Not on file  Intimate Partner Violence: Not on file    Past Medical History, Surgical history, Social history, and Family history were reviewed and updated as appropriate.   Please see review of systems for further details on the patient's review from today.   Objective:   Physical Exam:  BP (!) 152/100   Pulse 74   Physical Exam Constitutional:      General: She is not in acute distress. Musculoskeletal:        General: No deformity.  Neurological:     Mental Status: She is alert and oriented to person, place, and time.     Coordination: Coordination normal.     Gait: Gait normal.  Psychiatric:        Attention and Perception:  Perception normal. She is attentive.  Mood and Affect: Mood is depressed. Mood is not anxious. Affect is not labile, blunt, angry or inappropriate.        Speech: Speech is not rapid and pressured, slurred or tangential.        Behavior: Behavior is not agitated, slowed or aggressive.        Thought Content: Thought content normal. Thought content is not paranoid. Thought content does not include homicidal or suicidal ideation. Thought content does not include homicidal or suicidal plan.        Cognition and Memory: Cognition normal.        Judgment: Judgment normal.     Comments: Insight intact. No auditory or visual hallucinations. No delusions.     Lab Review:     Component Value Date/Time   NA 137 01/12/2021 1430   NA 140 11/18/2018 1544   K 3.8 01/12/2021 1430   CL 108 01/12/2021 1430   CO2 22 01/12/2021 1430   GLUCOSE 94 01/12/2021 1430   BUN 14 01/12/2021 1430   BUN 20 11/18/2018 1544   CREATININE 0.82 01/12/2021 1430   CALCIUM 8.9 01/12/2021 1430   PROT 6.6 01/12/2021 1430   ALBUMIN 3.9 01/12/2021 1430   AST 12 (L) 01/12/2021 1430   ALT 11 01/12/2021 1430   ALKPHOS 46 01/12/2021 1430   BILITOT 0.5 01/12/2021 1430   GFRNONAA >60 01/12/2021 1430   GFRAA >60 09/02/2019 0249   GFRAA >60 01/27/2019 0811       Component Value Date/Time   WBC 4.5 01/12/2021 1430   RBC 4.32 01/12/2021 1430   HGB 12.8 01/12/2021 1430   HGB 12.9 07/17/2019 0953   HCT 38.5 01/12/2021 1430   HCT 21.9 (L) 12/25/2018 1221   PLT 272 01/12/2021 1430   PLT 286 07/17/2019 0953   MCV 89.1 01/12/2021 1430   MCH 29.6 01/12/2021 1430   MCHC 33.2 01/12/2021 1430   RDW 12.4 01/12/2021 1430   LYMPHSABS 1.4 01/12/2021 1430   MONOABS 0.4 01/12/2021 1430   EOSABS 0.0 01/12/2021 1430   BASOSABS 0.0 01/12/2021 1430    No results found for: POCLITH, LITHIUM   No results found for: PHENYTOIN, PHENOBARB, VALPROATE, CBMZ   .res Assessment: Plan:    Bipolar 1 disorder, manic, moderate  (HCC)  Attention deficit hyperactivity disorder (ADHD), predominantly inattentive type  Generalized anxiety disorder  Insomnia due to mental condition  Alcohol use disorder, moderate, in early remission, dependence (HCC)   Greater than 50% of 30 min face to face time with patient was spent on counseling and coordination of care. We discussed how she is seen again urgently recently referred by her therapist because her therapist recognized mania.  The patient admitted recent mania which has resolved.  Her mania was treated  and she has swung into a post-manic depression which resolved with Abilify and med changes.  Discussed potential metabolic side effects associated with atypical antipsychotics, as well as potential risk for movement side effects. Advised pt to contact office if movement side effects occur.   Discussed potential benefits, risks, and side effects of stimulants with patient to include increased heart rate, palpitations, insomnia, increased anxiety, increased irritability, or decreased appetite.  Instructed patient to contact office if experiencing any significant tolerability issues. She wants to return to usual dose of Adderall for ADD bc of mor poor cognitive function with reduction.  Also discussed that depression will impair cognitive function.  Has Maintain sobriety  Recognizes effect of anemia on ADD and mood.  Had iron  infusions last winter.  continue fluoxetine to 20 mg daily Continue Abilify 1/2 of 15 mg tablet daily for depression and mania continue Adderall to XR 20 mg AM  No further prescriptions for benzodiazepines.  She agrees.  FU 6-week  Meredith Staggers, MD, DFAPA     Please see After Visit Summary for patient specific instructions.  Future Appointments  Date Time Provider Department Center  10/16/2021  4:30 PM Cottle, Steva Ready., MD [facility] None     No orders of the defined types were placed in this encounter.      -------------------------------

## 2021-08-16 ENCOUNTER — Other Ambulatory Visit: Payer: Self-pay | Admitting: Hematology

## 2021-08-16 ENCOUNTER — Other Ambulatory Visit: Payer: Self-pay | Admitting: Psychiatry

## 2021-08-16 DIAGNOSIS — F3130 Bipolar disorder, current episode depressed, mild or moderate severity, unspecified: Secondary | ICD-10-CM

## 2021-08-17 ENCOUNTER — Other Ambulatory Visit: Payer: Self-pay | Admitting: Psychiatry

## 2021-08-17 DIAGNOSIS — F3130 Bipolar disorder, current episode depressed, mild or moderate severity, unspecified: Secondary | ICD-10-CM

## 2021-08-22 ENCOUNTER — Other Ambulatory Visit: Payer: Self-pay

## 2021-08-22 ENCOUNTER — Telehealth: Payer: Self-pay | Admitting: Psychiatry

## 2021-08-22 DIAGNOSIS — F900 Attention-deficit hyperactivity disorder, predominantly inattentive type: Secondary | ICD-10-CM

## 2021-08-22 MED ORDER — AMPHETAMINE-DEXTROAMPHET ER 20 MG PO CP24: 20.0000 mg | ORAL_CAPSULE | ORAL | 0 refills | Status: DC

## 2021-08-22 NOTE — Telephone Encounter (Signed)
pended

## 2021-08-22 NOTE — Telephone Encounter (Signed)
Next visit is 10/16/21. Requesting refill for Adderall. Pharmacy is:  [id number] - [place], [place] - [address] AT [facility] [address]  Phone:  [phone]  Fax:  [phone]

## 2021-09-14 ENCOUNTER — Other Ambulatory Visit: Payer: Self-pay | Admitting: Hematology

## 2021-09-14 ENCOUNTER — Other Ambulatory Visit: Payer: Self-pay | Admitting: Psychiatry

## 2021-09-14 ENCOUNTER — Telehealth: Payer: Self-pay | Admitting: Psychiatry

## 2021-09-14 DIAGNOSIS — F3130 Bipolar disorder, current episode depressed, mild or moderate severity, unspecified: Secondary | ICD-10-CM

## 2021-09-14 DIAGNOSIS — F5105 Insomnia due to other mental disorder: Secondary | ICD-10-CM

## 2021-09-22 ENCOUNTER — Other Ambulatory Visit: Payer: Self-pay

## 2021-09-22 DIAGNOSIS — F900 Attention-deficit hyperactivity disorder, predominantly inattentive type: Secondary | ICD-10-CM

## 2021-09-22 MED ORDER — AMPHETAMINE-DEXTROAMPHET ER 20 MG PO CP24: 20.0000 mg | ORAL_CAPSULE | ORAL | 0 refills | Status: DC

## 2021-09-22 NOTE — Telephone Encounter (Signed)
Pt LVM asking for refill of Adderall to [facility].  Next appt 12/5

## 2021-10-10 ENCOUNTER — Telehealth: Payer: Self-pay | Admitting: Psychiatry

## 2021-10-10 NOTE — Telephone Encounter (Signed)
Have her take the Abilify 7.5 mg every other day until her appointment on December 5 and then we will discuss what to do from there.

## 2021-10-10 NOTE — Telephone Encounter (Signed)
Pt called and said that she had a question about her medicine. Please call her at [phone]

## 2021-10-10 NOTE — Telephone Encounter (Signed)
Pt stated she feels like the Abilify should be decreased to 5mg .She said she is depressed but rational and not suicidal.She has an appt Monday and can wait until then if you prefer.

## 2021-10-11 NOTE — Telephone Encounter (Signed)
Pt informed

## 2021-10-13 ENCOUNTER — Other Ambulatory Visit: Payer: Self-pay | Admitting: Hematology

## 2021-10-13 ENCOUNTER — Other Ambulatory Visit: Payer: Self-pay | Admitting: Psychiatry

## 2021-10-13 DIAGNOSIS — F5105 Insomnia due to other mental disorder: Secondary | ICD-10-CM

## 2021-10-16 ENCOUNTER — Other Ambulatory Visit: Payer: Self-pay

## 2021-10-16 ENCOUNTER — Encounter: Payer: Self-pay | Admitting: Hematology

## 2021-10-16 ENCOUNTER — Encounter: Payer: Self-pay | Admitting: Psychiatry

## 2021-10-16 ENCOUNTER — Ambulatory Visit (INDEPENDENT_AMBULATORY_CARE_PROVIDER_SITE_OTHER): Payer: 59 | Admitting: Psychiatry

## 2021-10-16 DIAGNOSIS — F411 Generalized anxiety disorder: Secondary | ICD-10-CM

## 2021-10-16 DIAGNOSIS — F3130 Bipolar disorder, current episode depressed, mild or moderate severity, unspecified: Secondary | ICD-10-CM

## 2021-10-16 DIAGNOSIS — F900 Attention-deficit hyperactivity disorder, predominantly inattentive type: Secondary | ICD-10-CM

## 2021-10-16 DIAGNOSIS — F1021 Alcohol dependence, in remission: Secondary | ICD-10-CM

## 2021-10-16 NOTE — Progress Notes (Signed)
[name] [id number] 1956-05-31 65 y.o.  Subjective:   Patient ID:  Laura Chang is a 65 y.o. (DOB [birth year]) female.  Chief Complaint:  Chief Complaint  Patient presents with   Follow-up   Bipolar 1 disorder, manic, moderate    Recurrent major depression in full remission     HPI Laura Chang presents to the office today for follow-up of depression and anxiety and ADD.  seen November 12, 2019.  Melted down in 2020.  Went to [facility] in July.  No withdrawal.  1 drink since then.  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 She was on no stimulant and no SSRI but was taking Strattera and Wellbutrin.  The following changes were made. Stop Strattera. OK restart stimulant bc severe ADD Restart Adderall 1 daily for a few days and if tolerated then restart 1 twice daily. If not tolerated reduce the dosage if needed. May need to stop Wellbutrin if not tolerating the stimulant.  Yes.  DC Wellbutrin Restart Prozac 20 mg daily.  February 2021 appointment with the following noted: Completed grant proposal.  Couldn't doit without Adderall.  Sold a bunch of work.   Adderall XR lasts about 3 pm.  Strength seems about right.  BP been OK.  Not jittery.   Stopped Wellbutrin but had no SE. Mood drastically better with grant proposal and back on fluoxetine.  Less depressed and lethargic.  No anxiety.  Cut back on coffee. Started back with devotions and stronger faith. Plan: Continue Prozac 20 mg daily. May have to increase the dose at some point in the future given that she usually was taking higher dosages but she is getting good response at this time. Restart Wellbutrin off label for ADD since can't get 2 ADDERALL daily. 150 mg daily then 300 mg daily. She can adjust the dose between 150 mg and 300 mg daily to get the optimal effect.   05/11/2020 appointment with the following noted: Has been inconsistent with Prozac and Wellbutrin. Not sure of the effect  of Wellbutrin. Biggest deterrent in work is anxiety.  Some of the work is conceptual and difficult at times.  Can feel she's not up to a project at times.  Overall is OK but would like a steadier benefit from stimulant.  Exhausted from managing concentration and keeping up with things from the day.  Loses things.  Not good keeping up with schedule. Overall productive and emotionally OK. Can feel Adderall wear off. Mood is better in summer and worse in the winter.   F died in 10-14-2023 and that is a loss. No SE Wellbutrin. Still attends AA meetings.  Real benefit from [facility] last year. Recognizes effect of anemia on ADD and mood.  Had iron infusions last winter. Plan:  Wellbutrin off label for ADD since can't get 2 ADDERALL daily. 150 mg daily then 300 mg daily.  01/24/2021 appointment with following noted: Doing a program called [employer] mindfulness app since Xmas.  CBT app helped the depression.  App helped her focus better.  Lost sign weight. Writing a lot. Before Xmas felt depressed and started negative thinking worse, self denigrating. Not drinking. More isolated.   Recognizes mo is narcissist.    Didn't tell anyone she was born until 3 mos later.  M aloof and uninterested in pt.  Lied about her birthday.  Mo lack of affection even with pt's kids. Going to AA for a year and it helped her to  quit drinking. Also misses kids being gone with a hole also. Plan: No med changes  05/04/2021 appointment with the following noted: Therapist [name] thinks she's manic. Lost weight to 144#.   States she is still sleeping okay.  Admits she is hyper and recognizes that she is likely manic.  She feels great, euphoric with an increased sense of spiritual connectedness to God.  She has racing thoughts and talks fast and talks a lot and this is noted by her husband.  He thinks she is a bit hyper.  She has been able to maintain sobriety although she will have 1 glass of wine on special occasions  but does not drink by herself.  She is not drinking to excess.  She denies any dangerous impulsivity.  She is clearly not depressed and not particularly anxious.  She has no concerns about her medication and she has been compliant.  06/16/21 appt noted: So much better.  Going through a lot but the manic thing happened on top of it.  So much slower.  Didn't feel like losing anything with risperidone.  Likes the Adderalll at 10 mg. Some drowsiness in the AM and very drowsy from risperidone 2 mg HS. Prayer life is better. Handling stress better. Less depressed with risperidone. Still likes trazodone. Sleeps well. Plan: Reduce Prozac to 10 mg daily.  Consider stopping it because it can feel the mania however she is reluctant to do that because she fears relapse of depression. Reduce risperidone to 1.5 mg nightly due to side effects.  Discussed risk of worsening mania.  07/25/2021 appointment with the following noted: Misses the Adderall and hard to function without it. Depressed now. Heavy chest.  Anxious and guilty.  Body feels heavy.   Hates Wellbutrin.   Plan: Increase fluoxetine to 20 mg daily Add Abilify 1/2 of 15 mg tablet daily Wean wellbutrin by 1 tablet each week  bc she feels it is not helpful and DT polypharmacy Reduce risperidone to 1 daily for 1 week and stop it. Disc risk of mania. Increase Adderall to XR 20 mg AM  08/08/21 Much less depressed and starting to feel normal I feel a lot better. No SE.  Speech normal off risperidone. Sleeping OK on trazaodone and enough.   Noticed benefit from Adderall again. Plan: continue fluoxetine to 20 mg daily Continue Abilify 1/2 of 15 mg tablet daily for depression and mania continue Adderall to XR 20 mg AM  10/10/2021 phone call: Pt stated she feels like the Abilify should be decreased to 5mg .She said she is depressed but rational and not suicidal.She has an appt Monday and can wait until then if you prefer. MD response: Reduce the Abilify  to 7.5 mg every other day.  We will meet on 10/16/2021 and decide what to do from there.  10/16/2021 appointment with the following noted: More depressed.  Most depressed I've ever been.  Just numb.  Sense of grief.   Thinks the manic episode was unlike anything else she ever had.  Doesn't want to medicate against it.  Don't enjoy people.  Easily overwhelmed.  Had some death thoughts but not suicidal.  Has been functional.  Feels better today after reducing Abilify to every other day but she is only been doing that for 3 days.  D Middie most like her.  Past Psychiatric Medication Trials: fluoxetine, duloxetine, Viibryd, lamotrigine, Pristiq, sertraline, citalopram Adderall, Adderall XR, Vyvanse, Ritalin, Strattera low dose NR Lorazepam Trazodone Wellbutrin Depakote, lamotrigine cog complaints Lithium remotely  At visit November 12, 2019. We discussed Patient developed an increasingly severe alcohol dependence problem since her last visit in January.  She went to [facility] and has had no alcohol since then except 1 day.  She never abused stimulants but they took her off the stimulants at [facility].  Her ADD was markedly worse.  The Wellbutrin did not helped the ADD.   Review of Systems:  Review of Systems  Constitutional:  Negative for unexpected weight change.  Musculoskeletal:  Positive for arthralgias, back pain and joint swelling.       SP hip surgery October 2020  Neurological:  Negative for tremors and weakness.  Psychiatric/Behavioral:  Negative for agitation, behavioral problems, confusion, decreased concentration, dysphoric mood, hallucinations, self-injury, sleep disturbance and suicidal ideas. The patient is not nervous/anxious and is not hyperactive.    Medications: I have reviewed the patient's current medications.  Current Outpatient Medications  Medication Sig Dispense Refill   amphetamine-dextroamphetamine (ADDERALL XR) 20 MG 24 hr capsule Take 1 capsule (20 mg  total) by mouth every morning. 30 capsule 0   ARIPiprazole (ABILIFY) 15 MG tablet TAKE 1 TABLET (15 MG TOTAL) BY MOUTH DAILY. (Patient taking differently: 7.5 mg every other day for 3 days, previously 7.5 mg daily) 90 tablet 0   FLUoxetine (PROZAC) 20 MG capsule TAKE 1 CAPSULE BY MOUTH EVERY DAY 90 capsule 0   iron polysaccharides (NIFEREX) 150 MG capsule TAKE 1 CAPSULE BY MOUTH EVERY DAY 30 capsule 1   traZODone (DESYREL) 50 MG tablet TAKE 1 TABLET BY MOUTH AT BEDTIME AS NEEDED FOR SLEEP. 30 tablet 8   No current facility-administered medications for this visit.    Medication Side Effects: None  Allergies:  Allergies  Allergen Reactions   Metronidazole Shortness Of Breath and Other (See Comments)    Heart pounding   Ferrlecit [Na Ferric Gluc Cplx In Sucrose] Other (See Comments)    Infusion reaction 05/12/2019    Past Medical History:  Diagnosis Date   ADHD    Anemia    Anxiety    Arthritis    Depression    Heart murmur    i went to see a cardiologit slast eyar  and i had zero plaque,    PONV (postoperative nausea and vomiting)    Recovering alcoholic in remission (HCC)     Family History  Problem Relation Age of Onset   Atrial fibrillation Mother    CAD Father     Social History   Socioeconomic History   Marital status: Married    Spouse name: Not on file   Number of children: Not on file   Years of education: Not on file   Highest education level: Not on file  Occupational History   Not on file  Tobacco Use   Smoking status: Former    Types: Cigarettes    Quit date: 08/16/2003    Years since quitting: 18.1   Smokeless tobacco: Never   Tobacco comments:     08-28-2019 "i smoked 2 cigarettes in the last month since my father  passed"  Vaping Use   Vaping Use: Never used  Substance and Sexual Activity   Alcohol use: Yes    Alcohol/week: 10.0 standard drinks    Types: 10 Glasses of wine per week   Drug use: No   Sexual activity: Not on file  Other Topics  Concern   Not on file  Social History Narrative   Not on file   Social Determinants of  Health   Financial Resource Strain: Not on file  Food Insecurity: Not on file  Transportation Needs: Not on file  Physical Activity: Not on file  Stress: Not on file  Social Connections: Not on file  Intimate Partner Violence: Not on file    Past Medical History, Surgical history, Social history, and Family history were reviewed and updated as appropriate.   Please see review of systems for further details on the patient's review from today.   Objective:   Physical Exam:  There were no vitals taken for this visit.  Physical Exam Constitutional:      General: She is not in acute distress. Musculoskeletal:        General: No deformity.  Neurological:     Mental Status: She is alert and oriented to person, place, and time.     Coordination: Coordination normal.     Gait: Gait normal.  Psychiatric:        Attention and Perception: Perception normal. She is attentive.        Mood and Affect: Mood is depressed. Mood is not anxious. Affect is tearful. Affect is not labile, blunt, angry or inappropriate.        Speech: Speech is not rapid and pressured, slurred or tangential.        Behavior: Behavior is not agitated, slowed or aggressive.        Thought Content: Thought content normal. Thought content is not paranoid. Thought content does not include homicidal or suicidal ideation. Thought content does not include suicidal plan.        Cognition and Memory: Cognition normal.        Judgment: Judgment normal.     Comments: Insight intact. No auditory or visual hallucinations. No delusions.     Lab Review:     Component Value Date/Time   NA 137 01/12/2021 1430   NA 140 11/18/2018 1544   K 3.8 01/12/2021 1430   CL 108 01/12/2021 1430   CO2 22 01/12/2021 1430   GLUCOSE 94 01/12/2021 1430   BUN 14 01/12/2021 1430   BUN 20 11/18/2018 1544   CREATININE 0.82 01/12/2021 1430   CALCIUM 8.9  01/12/2021 1430   PROT 6.6 01/12/2021 1430   ALBUMIN 3.9 01/12/2021 1430   AST 12 (L) 01/12/2021 1430   ALT 11 01/12/2021 1430   ALKPHOS 46 01/12/2021 1430   BILITOT 0.5 01/12/2021 1430   GFRNONAA >60 01/12/2021 1430   GFRAA >60 09/02/2019 0249   GFRAA >60 01/27/2019 0811       Component Value Date/Time   WBC 4.5 01/12/2021 1430   RBC 4.32 01/12/2021 1430   HGB 12.8 01/12/2021 1430   HGB 12.9 07/17/2019 0953   HCT 38.5 01/12/2021 1430   HCT 21.9 (L) 12/25/2018 1221   PLT 272 01/12/2021 1430   PLT 286 07/17/2019 0953   MCV 89.1 01/12/2021 1430   MCH 29.6 01/12/2021 1430   MCHC 33.2 01/12/2021 1430   RDW 12.4 01/12/2021 1430   LYMPHSABS 1.4 01/12/2021 1430   MONOABS 0.4 01/12/2021 1430   EOSABS 0.0 01/12/2021 1430   BASOSABS 0.0 01/12/2021 1430    No results found for: POCLITH, LITHIUM   No results found for: PHENYTOIN, PHENOBARB, VALPROATE, CBMZ   .res Assessment: Plan:    Bipolar I disorder, most recent episode depressed (HCC)  Attention deficit hyperactivity disorder (ADHD), predominantly inattentive type  Generalized anxiety disorder  Alcohol use disorder, moderate, in early remission, dependence (HCC)   Greater than 50% of  30 min face to face time with patient was spent on counseling and coordination of care. We discussed how she was recently referred by her therapist because her therapist recognized mania.  Psychiatrist also verify the diagnosis.  The patient admitted recent mania which has resolved. Her mania was treated  and she has swung into a post-manic depression which initially resolved with Abilify and med changes.  However she has become markedly more depressed since the last visit.  She had been taking Abilify 7.5 mg daily.  She feels like the dosage is too high. We discussed in detail how Abilify has a long half-life.  This means that the blood level can accumulate over a period of several weeks leading to side effects.  Also Abilify is unusual in that  low doses are most effective for depression and moderate doses for mood stabilization.  We discussed the pros and cons of how to approach this bipolar depression.  She would rather not be treated for bipolar disorder than to feel this way.  Post manic depression was explained in detail.  She is expected to fully recover.  However we would be unwise and against standard medical practice to treat her with a antidepressant without a mood stabilizer.  However we also discussed drug to drug interactions can lead to fluoxetine causing high blood levels of Abilify.  We agreed together to reduce the dosage.  Discussed the alternative of Vraylar as well as several other options.  Discussed potential metabolic side effects associated with atypical antipsychotics, as well as potential risk for movement side effects. Advised pt to contact office if movement side effects occur.   Discussed potential benefits, risks, and side effects of stimulants with patient to include increased heart rate, palpitations, insomnia, increased anxiety, increased irritability, or decreased appetite.  Instructed patient to contact office if experiencing any significant tolerability issues. She wants to return to usual dose of Adderall for ADD bc of mor poor cognitive function with reduction.  Also discussed that depression will impair cognitive function.  Has Maintain sobriety  Recognizes effect of anemia on ADD and mood.  Had iron infusions last winter.  continue fluoxetine to 20 mg daily  Hold Abilify for 1 week then resume Abilify 1/2 of 15 mg tablet every other day for depression and mania continue Adderall to XR 20 mg AM  No further prescriptions for benzodiazepines.  She agrees.  FU in 11 days to be sure that she is improving.  Call if there is any worsening of depression  Meredith Staggers, MD, DFAPA     Please see After Visit Summary for patient specific instructions.  Future Appointments  Date Time Provider  Department Center  10/27/2021 11:30 AM Cottle, Steva Ready., MD [facility] None     No orders of the defined types were placed in this encounter.     -------------------------------

## 2021-10-17 ENCOUNTER — Other Ambulatory Visit: Payer: Self-pay | Admitting: Family Medicine

## 2021-10-17 DIAGNOSIS — Z1231 Encounter for screening mammogram for malignant neoplasm of breast: Secondary | ICD-10-CM

## 2021-10-20 ENCOUNTER — Other Ambulatory Visit: Payer: Self-pay

## 2021-10-20 ENCOUNTER — Telehealth: Payer: Self-pay | Admitting: Psychiatry

## 2021-10-20 DIAGNOSIS — F900 Attention-deficit hyperactivity disorder, predominantly inattentive type: Secondary | ICD-10-CM

## 2021-10-20 MED ORDER — AMPHETAMINE-DEXTROAMPHET ER 20 MG PO CP24: 20.0000 mg | ORAL_CAPSULE | ORAL | 0 refills | Status: DC

## 2021-10-20 NOTE — Telephone Encounter (Signed)
Pended.

## 2021-10-20 NOTE — Telephone Encounter (Signed)
Patient Laura Chang requesting a refill on the Adderall. Fill at the [facility] on [address]. Patient is scheduled for an office visit on 12/16.

## 2021-10-27 ENCOUNTER — Encounter: Payer: Self-pay | Admitting: Psychiatry

## 2021-10-27 ENCOUNTER — Ambulatory Visit (INDEPENDENT_AMBULATORY_CARE_PROVIDER_SITE_OTHER): Payer: 59 | Admitting: Psychiatry

## 2021-10-27 ENCOUNTER — Other Ambulatory Visit: Payer: Self-pay

## 2021-10-27 DIAGNOSIS — F5105 Insomnia due to other mental disorder: Secondary | ICD-10-CM

## 2021-10-27 DIAGNOSIS — F3130 Bipolar disorder, current episode depressed, mild or moderate severity, unspecified: Secondary | ICD-10-CM | POA: Diagnosis not present

## 2021-10-27 DIAGNOSIS — F1021 Alcohol dependence, in remission: Secondary | ICD-10-CM | POA: Diagnosis not present

## 2021-10-27 DIAGNOSIS — F900 Attention-deficit hyperactivity disorder, predominantly inattentive type: Secondary | ICD-10-CM | POA: Diagnosis not present

## 2021-10-27 DIAGNOSIS — F411 Generalized anxiety disorder: Secondary | ICD-10-CM

## 2021-10-27 NOTE — Progress Notes (Signed)
[name] [id number] 04-11-1956 65 y.o.  Subjective:   Patient ID:  Laura Chang is a 65 y.o. (DOB [birth year]) female.  Chief Complaint:  Chief Complaint  Patient presents with   Follow-up   Depression   Anxiety   Medication Problem    HPI Laura Chang presents to the office today for follow-up of depression and anxiety and ADD.  seen November 12, 2019.  Melted down in 2020.  Went to [facility] in July.  No withdrawal.  1 drink since then.  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 She was on no stimulant and no SSRI but was taking Strattera and Wellbutrin.  The following changes were made. Stop Strattera. OK restart stimulant bc severe ADD Restart Adderall 1 daily for a few days and if tolerated then restart 1 twice daily. If not tolerated reduce the dosage if needed. May need to stop Wellbutrin if not tolerating the stimulant.  Yes.  DC Wellbutrin Restart Prozac 20 mg daily.  February 2021 appointment with the following noted: Completed grant proposal.  Couldn't doit without Adderall.  Sold a bunch of work.   Adderall XR lasts about 3 pm.  Strength seems about right.  BP been OK.  Not jittery.   Stopped Wellbutrin but had no SE. Mood drastically better with grant proposal and back on fluoxetine.  Less depressed and lethargic.  No anxiety.  Cut back on coffee. Started back with devotions and stronger faith. Plan: Continue Prozac 20 mg daily. May have to increase the dose at some point in the future given that she usually was taking higher dosages but she is getting good response at this time. Restart Wellbutrin off label for ADD since can't get 2 ADDERALL daily. 150 mg daily then 300 mg daily. She can adjust the dose between 150 mg and 300 mg daily to get the optimal effect.   05/11/2020 appointment with the following noted: Has been inconsistent with Prozac and Wellbutrin. Not sure of the effect of Wellbutrin. Biggest deterrent in work  is anxiety.  Some of the work is conceptual and difficult at times.  Can feel she's not up to a project at times.  Overall is OK but would like a steadier benefit from stimulant.  Exhausted from managing concentration and keeping up with things from the day.  Loses things.  Not good keeping up with schedule. Overall productive and emotionally OK. Can feel Adderall wear off. Mood is better in summer and worse in the winter.   F died in 28-Oct-2023 and that is a loss. No SE Wellbutrin. Still attends AA meetings.  Real benefit from [facility] [employer] last year. Recognizes effect of anemia on ADD and mood.  Had iron infusions last winter. Plan:  Wellbutrin off label for ADD since can't get 2 ADDERALL daily. 150 mg daily then 300 mg daily.  01/24/2021 appointment with following noted: Doing a program called [employer] mindfulness app since Xmas.  CBT app helped the depression.  App helped her focus better.  Lost sign weight. Writing a lot. Before Xmas felt depressed and started negative thinking worse, self denigrating. Not drinking. More isolated.   Recognizes mo is narcissist.    Didn't tell anyone she was born until 3 mos later.  M aloof and uninterested in pt.  Lied about her birthday.  Mo lack of affection even with pt's kids. Going to AA for a year and it helped her to quit drinking. Also misses kids being gone  with a hole also. Plan: No med changes  05/04/2021 appointment with the following noted: Therapist [name] thinks she's manic. Lost weight to 144#.   States she is still sleeping okay.  Admits she is hyper and recognizes that she is likely manic.  She feels great, euphoric with an increased sense of spiritual connectedness to God.  She has racing thoughts and talks fast and talks a lot and this is noted by her husband.  He thinks she is a bit hyper.  She has been able to maintain sobriety although she will have 1 glass of wine on special occasions but does not drink by herself.  She is not  drinking to excess.  She denies any dangerous impulsivity.  She is clearly not depressed and not particularly anxious.  She has no concerns about her medication and she has been compliant.  06/16/21 appt noted: So much better.  Going through a lot but the manic thing happened on top of it.  So much slower.  Didn't feel like losing anything with risperidone.  Likes the Adderalll at 10 mg. Some drowsiness in the AM and very drowsy from risperidone 2 mg HS. Prayer life is better. Handling stress better. Less depressed with risperidone. Still likes trazodone. Sleeps well. Plan: Reduce Prozac to 10 mg daily.  Consider stopping it because it can feel the mania however she is reluctant to do that because she fears relapse of depression. Reduce risperidone to 1.5 mg nightly due to side effects.  Discussed risk of worsening mania.  07/25/2021 appointment with the following noted: Misses the Adderall and hard to function without it. Depressed now. Heavy chest.  Anxious and guilty.  Body feels heavy.   Hates Wellbutrin.   Plan: Increase fluoxetine to 20 mg daily Add Abilify 1/2 of 15 mg tablet daily Wean wellbutrin by 1 tablet each week  bc she feels it is not helpful and DT polypharmacy Reduce risperidone to 1 daily for 1 week and stop it. Disc risk of mania. Increase Adderall to XR 20 mg AM  08/08/21 Much less depressed and starting to feel normal I feel a lot better. No SE.  Speech normal off risperidone. Sleeping OK on trazaodone and enough.   Noticed benefit from Adderall again. Plan: continue fluoxetine to 20 mg daily Continue Abilify 1/2 of 15 mg tablet daily for depression and mania continue Adderall to XR 20 mg AM  10/10/2021 phone call: Pt stated she feels like the Abilify should be decreased to .She said she is depressed but rational and not suicidal.She has an appt Monday and can wait until then if you prefer. MD response: Reduce the Abilify to 7.5 mg every other day.  We will meet on  10/16/2021 and decide what to do from there.  10/16/2021 appointment with the following noted: More depressed.  Most depressed I've ever been.  Just numb.  Sense of grief.   Thinks the manic episode was unlike anything else she ever had.  Doesn't want to medicate against it.  Don't enjoy people.  Easily overwhelmed.  Had some death thoughts but not suicidal.  Has been functional.  Feels better today after reducing Abilify to every other day but she is only been doing that for 3 days. A/P: Episode of post manic depression was explained. continue fluoxetine to 20 mg daily Hold Abilify for 1 week then resume Abilify 1/2 of 15 mg tablet every other day for depression and mania continue Adderall to XR 20 mg AM  10/27/2021  appointment with the following noted: I'm doing so much better.  Handling the depression better. Better self talk and spiritual focus has helped.   Dep 6/10 manifesting as anxiety with low confidence.   F died 2  years ago and M 64 yo and is dependent . She is working hard to feel better but still feels depressed.  She almost feels like she has a little more anxiety since restarting Abilify every other day.  D Middie most like her.  Past Psychiatric Medication Trials: fluoxetine, duloxetine, Viibryd, lamotrigine, Pristiq, sertraline, citalopram Adderall, Adderall XR, Vyvanse, Ritalin, Strattera low dose NR Lorazepam Trazodone Wellbutrin Depakote, lamotrigine cog complaints Lithium remotely Abilify 7.5   At visit November 12, 2019. We discussed Patient developed an increasingly severe alcohol dependence problem since her last visit in January.  She went to [facility] and has had no alcohol since then except 1 day.  She never abused stimulants but they took her off the stimulants at [facility].  Her ADD was markedly worse.  The Wellbutrin did not helped the ADD.   Review of Systems:  Review of Systems  Constitutional:  Negative for unexpected weight change.   Musculoskeletal:  Positive for arthralgias, back pain and joint swelling.       SP hip surgery October 2020  Neurological:  Negative for tremors and weakness.  Psychiatric/Behavioral:  Positive for dysphoric mood. Negative for agitation, behavioral problems, confusion, decreased concentration, hallucinations, self-injury, sleep disturbance and suicidal ideas. The patient is not nervous/anxious and is not hyperactive.    Medications: I have reviewed the patient's current medications.  Current Outpatient Medications  Medication Sig Dispense Refill   amphetamine-dextroamphetamine (ADDERALL XR) 20 MG 24 hr capsule Take 1 capsule (20 mg total) by mouth every morning. 30 capsule 0   ARIPiprazole (ABILIFY) 15 MG tablet TAKE 1 TABLET (15 MG TOTAL) BY MOUTH DAILY. (Patient taking differently: 7.5 mg every other day for 3 days, previously 7.5 mg daily) 90 tablet 0   FLUoxetine (PROZAC) 20 MG capsule TAKE 1 CAPSULE BY MOUTH EVERY DAY 90 capsule 0   iron polysaccharides (NIFEREX) 150 MG capsule TAKE 1 CAPSULE BY MOUTH EVERY DAY 30 capsule 1   traZODone (DESYREL) 50 MG tablet TAKE 1 TABLET BY MOUTH AT BEDTIME AS NEEDED FOR SLEEP. 30 tablet 8   No current facility-administered medications for this visit.    Medication Side Effects: None  Allergies:  Allergies  Allergen Reactions   Metronidazole Shortness Of Breath and Other (See Comments)    Heart pounding   Ferrlecit [Na Ferric Gluc Cplx In Sucrose] Other (See Comments)    Infusion reaction 05/12/2019    Past Medical History:  Diagnosis Date   ADHD    Anemia    Anxiety    Arthritis    Depression    Heart murmur    i went to see a cardiologit slast eyar  and i had zero plaque,    PONV (postoperative nausea and vomiting)    Recovering alcoholic in remission (HCC)     Family History  Problem Relation Age of Onset   Atrial fibrillation Mother    CAD Father     Social History   Socioeconomic History   Marital status: Married     Spouse name: Not on file   Number of children: Not on file   Years of education: Not on file   Highest education level: Not on file  Occupational History   Not on file  Tobacco Use  Smoking status: Former    Types: Cigarettes    Quit date: 08/16/2003    Years since quitting: 18.2   Smokeless tobacco: Never   Tobacco comments:     08-28-2019 "i smoked 2 cigarettes in the last month since my father  passed"  Vaping Use   Vaping Use: Never used  Substance and Sexual Activity   Alcohol use: Yes    Alcohol/week: 10.0 standard drinks    Types: 10 Glasses of wine per week   Drug use: No   Sexual activity: Not on file  Other Topics Concern   Not on file  Social History Narrative   Not on file   Social Determinants of Health   Financial Resource Strain: Not on file  Food Insecurity: Not on file  Transportation Needs: Not on file  Physical Activity: Not on file  Stress: Not on file  Social Connections: Not on file  Intimate Partner Violence: Not on file    Past Medical History, Surgical history, Social history, and Family history were reviewed and updated as appropriate.   Please see review of systems for further details on the patient's review from today.   Objective:   Physical Exam:  There were no vitals taken for this visit.  Physical Exam Constitutional:      General: She is not in acute distress. Musculoskeletal:        General: No deformity.  Neurological:     Mental Status: She is alert and oriented to person, place, and time.     Coordination: Coordination normal.     Gait: Gait normal.  Psychiatric:        Attention and Perception: Perception normal. She is attentive.        Mood and Affect: Mood is anxious and depressed. Affect is not labile, blunt, angry, tearful or inappropriate.        Speech: Speech is not rapid and pressured, slurred or tangential.        Behavior: Behavior is not agitated, slowed or aggressive.        Thought Content: Thought  content normal. Thought content is not paranoid. Thought content does not include homicidal or suicidal ideation. Thought content does not include suicidal plan.        Cognition and Memory: Cognition normal.        Judgment: Judgment normal.     Comments: Insight intact. No auditory or visual hallucinations. No delusions.  Oppression is slightly better than the last visit but still moderately severe as well as a lot of anxiety.    Lab Review:     Component Value Date/Time   NA 137 01/12/2021 1430   NA 140 11/18/2018 1544   K 3.8 01/12/2021 1430   CL 108 01/12/2021 1430   CO2 22 01/12/2021 1430   GLUCOSE 94 01/12/2021 1430   BUN 14 01/12/2021 1430   BUN 20 11/18/2018 1544   CREATININE 0.82 01/12/2021 1430   CALCIUM 8.9 01/12/2021 1430   PROT 6.6 01/12/2021 1430   ALBUMIN 3.9 01/12/2021 1430   AST 12 (L) 01/12/2021 1430   ALT 11 01/12/2021 1430   ALKPHOS 46 01/12/2021 1430   BILITOT 0.5 01/12/2021 1430   GFRNONAA >60 01/12/2021 1430   GFRAA >60 09/02/2019 0249   GFRAA >60 01/27/2019 0811       Component Value Date/Time   WBC 4.5 01/12/2021 1430   RBC 4.32 01/12/2021 1430   HGB 12.8 01/12/2021 1430   HGB 12.9 07/17/2019 0953   HCT  38.5 01/12/2021 1430   HCT 21.9 (L) 12/25/2018 1221   PLT 272 01/12/2021 1430   PLT 286 07/17/2019 0953   MCV 89.1 01/12/2021 1430   MCH 29.6 01/12/2021 1430   MCHC 33.2 01/12/2021 1430   RDW 12.4 01/12/2021 1430   LYMPHSABS 1.4 01/12/2021 1430   MONOABS 0.4 01/12/2021 1430   EOSABS 0.0 01/12/2021 1430   BASOSABS 0.0 01/12/2021 1430    No results found for: POCLITH, LITHIUM   No results found for: PHENYTOIN, PHENOBARB, VALPROATE, CBMZ   .res Assessment: Plan:    Bipolar I disorder, most recent episode depressed (HCC)  Attention deficit hyperactivity disorder (ADHD), predominantly inattentive type  Generalized anxiety disorder  Alcohol use disorder, moderate, in early remission, dependence (HCC)  Insomnia due to mental  condition   Greater than 50% of 30 min face to face time with patient was spent on counseling and coordination of care. We discussed how she was recently referred by her therapist because her therapist recognized mania.  Psychiatrist also verify the diagnosis.  The patient admitted recent mania which has resolved. Her mania was treated  and she has swung into a post-manic depression which initially resolved with Abilify and med changes.   However she has developed a post manic depression.  This was explained in detail.  Abilify has not been significantly helpful..    We discussed the pros and cons of how to approach this bipolar depression.  She would rather not be treated for bipolar disorder than to feel this way.  Post manic depression was explained in detail.  She is expected to fully recover.  However we would be unwise and against standard medical practice to treat her with a antidepressant without a mood stabilizer.  .  Discussed the alternative of Vraylar as well as several other options.  Discussed potential metabolic side effects associated with atypical antipsychotics, as well as potential risk for movement side effects. Advised pt to contact office if movement side effects occur.   Discussed potential benefits, risks, and side effects of stimulants with patient to include increased heart rate, palpitations, insomnia, increased anxiety, increased irritability, or decreased appetite.  Instructed patient to contact office if experiencing any significant tolerability issues. She wants to return to usual dose of Adderall for ADD bc of mor poor cognitive function with reduction.  Also discussed that depression will impair cognitive function.  Has Maintain sobriety  Recognizes effect of anemia on ADD and mood.  Had iron infusions last winter.  continue fluoxetine to 20 mg daily  DC Abilify .  Vrayalar 1.5 mg QOD to try to get rid of depression ASAP. continue Adderall to XR 20 mg AM  No  further prescriptions for benzodiazepines.  She agrees.  FU  2 weeks urgently to be sure that she is improving.  Call if there is any worsening of depression  Meredith Staggers, MD, DFAPA     Please see After Visit Summary for patient specific instructions.  Future Appointments  Date Time Provider Department Center  11/10/2021 10:30 AM Cottle, Steva Ready., MD [facility] None     No orders of the defined types were placed in this encounter.      -------------------------------

## 2021-11-10 ENCOUNTER — Other Ambulatory Visit: Payer: Self-pay

## 2021-11-10 ENCOUNTER — Ambulatory Visit (INDEPENDENT_AMBULATORY_CARE_PROVIDER_SITE_OTHER): Payer: 59 | Admitting: Psychiatry

## 2021-11-10 ENCOUNTER — Encounter: Payer: Self-pay | Admitting: Psychiatry

## 2021-11-10 VITALS — BP 129/87 | HR 81

## 2021-11-10 DIAGNOSIS — F5105 Insomnia due to other mental disorder: Secondary | ICD-10-CM | POA: Diagnosis not present

## 2021-11-10 DIAGNOSIS — F411 Generalized anxiety disorder: Secondary | ICD-10-CM | POA: Diagnosis not present

## 2021-11-10 DIAGNOSIS — F900 Attention-deficit hyperactivity disorder, predominantly inattentive type: Secondary | ICD-10-CM

## 2021-11-10 DIAGNOSIS — F3130 Bipolar disorder, current episode depressed, mild or moderate severity, unspecified: Secondary | ICD-10-CM

## 2021-11-10 NOTE — Progress Notes (Signed)
Laura BOCCIO [id number] 1956/01/04 65 y.o.  Subjective:   Patient ID:  Laura Chang is a 65 y.o. (DOB 1956-02-25) female.  Chief Complaint:  Chief Complaint  Patient presents with   Follow-up    Bipolar I disorder, most recent episode depressed (HCC)   Depression   ADHD    HPI Laura Chang presents to the office today for follow-up of depression and anxiety and ADD.  seen November 12, 2019.  Melted down in 2020.  Went to [facility] in July.  No withdrawal.  1 drink since then.  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 She was on no stimulant and no SSRI but was taking Strattera and Wellbutrin.  The following changes were made. Stop Strattera. OK restart stimulant bc severe ADD Restart Adderall 1 daily for a few days and if tolerated then restart 1 twice daily. If not tolerated reduce the dosage if needed. May need to stop Wellbutrin if not tolerating the stimulant.  Yes.  DC Wellbutrin Restart Prozac 20 mg daily.  February 2021 appointment with the following noted: Completed grant proposal.  Couldn't doit without Adderall.  Sold a bunch of work.   Adderall XR lasts about 3 pm.  Strength seems about right.  BP been OK.  Not jittery.   Stopped Wellbutrin but had no SE. Mood drastically better with grant proposal and back on fluoxetine.  Less depressed and lethargic.  No anxiety.  Cut back on coffee. Started back with devotions and stronger faith. Plan: Continue Prozac 20 mg daily. May have to increase the dose at some point in the future given that she usually was taking higher dosages but she is getting good response at this time. Restart Wellbutrin off label for ADD since can't get 2 ADDERALL daily. 150 mg daily then 300 mg daily. She can adjust the dose between 150 mg and 300 mg daily to get the optimal effect.   05/11/2020 appointment with the following noted: Has been inconsistent with Prozac and Wellbutrin. Not sure of the effect of  Wellbutrin. Biggest deterrent in work is anxiety.  Some of the work is conceptual and difficult at times.  Can feel she's not up to a project at times.  Overall is OK but would like a steadier benefit from stimulant.  Exhausted from managing concentration and keeping up with things from the day.  Loses things.  Not good keeping up with schedule. Overall productive and emotionally OK. Can feel Adderall wear off. Mood is better in summer and worse in the winter.   F died in 10-Oct-2023 and that is a loss. No SE Wellbutrin. Still attends AA meetings.  Real benefit from [facility] last year. Recognizes effect of anemia on ADD and mood.  Had iron infusions last winter. Plan:  Wellbutrin off label for ADD since can't get 2 ADDERALL daily. 150 mg daily then 300 mg daily.  01/24/2021 appointment with following noted: Doing a program called [employer] mindfulness app since Xmas.  CBT app helped the depression.  App helped her focus better.  Lost sign weight. Writing a lot. Before Xmas felt depressed and started negative thinking worse, self denigrating. Not drinking. More isolated.   Recognizes mo is narcissist.    Didn't tell anyone she was born until 3 mos later.  M aloof and uninterested in pt.  Lied about her birthday.  Mo lack of affection even with pt's kids. Going to AA for a year and it helped her to  quit drinking. Also misses kids being gone with a hole also. Plan: No med changes  05/04/2021 appointment with the following noted: Therapist [name] thinks she's manic. Lost weight to 144#.   States she is still sleeping okay.  Admits she is hyper and recognizes that she is likely manic.  She feels great, euphoric with an increased sense of spiritual connectedness to God.  She has racing thoughts and talks fast and talks a lot and this is noted by her husband.  He thinks she is a bit hyper.  She has been able to maintain sobriety although she will have 1 glass of wine on special occasions but  does not drink by herself.  She is not drinking to excess.  She denies any dangerous impulsivity.  She is clearly not depressed and not particularly anxious.  She has no concerns about her medication and she has been compliant.  06/16/21 appt noted: So much better.  Going through a lot but the manic thing happened on top of it.  So much slower.  Didn't feel like losing anything with risperidone.  Likes the Adderalll at 10 mg. Some drowsiness in the AM and very drowsy from risperidone 2 mg HS. Prayer life is better. Handling stress better. Less depressed with risperidone. Still likes trazodone. Sleeps well. Plan: Reduce Prozac to 10 mg daily.  Consider stopping it because it can feel the mania however she is reluctant to do that because she fears relapse of depression. Reduce risperidone to 1.5 mg nightly due to side effects.  Discussed risk of worsening mania.  07/25/2021 appointment with the following noted: Misses the Adderall and hard to function without it. Depressed now. Heavy chest.  Anxious and guilty.  Body feels heavy.   Hates Wellbutrin.   Plan: Increase fluoxetine to 20 mg daily Add Abilify 1/2 of 15 mg tablet daily Wean wellbutrin by 1 tablet each week  bc she feels it is not helpful and DT polypharmacy Reduce risperidone to 1 daily for 1 week and stop it. Disc risk of mania. Increase Adderall to XR 20 mg AM  08/08/21 Much less depressed and starting to feel normal I feel a lot better. No SE.  Speech normal off risperidone. Sleeping OK on trazaodone and enough.   Noticed benefit from Adderall again. Plan: continue fluoxetine to 20 mg daily Continue Abilify 1/2 of 15 mg tablet daily for depression and mania continue Adderall to XR 20 mg AM  10/10/2021 phone call: Pt stated she feels like the Abilify should be decreased to 5mg .She said she is depressed but rational and not suicidal.She has an appt Monday and can wait until then if you prefer. MD response: Reduce the Abilify to  7.5 mg every other day.  We will meet on 10/16/2021 and decide what to do from there.  10/16/2021 appointment with the following noted: More depressed.  Most depressed I've ever been.  Just numb.  Sense of grief.   Thinks the manic episode was unlike anything else she ever had.  Doesn't want to medicate against it.  Don't enjoy people.  Easily overwhelmed.  Had some death thoughts but not suicidal.  Has been functional.  Feels better today after reducing Abilify to every other day but she is only been doing that for 3 days. A/P: Episode of post manic depression was explained. continue fluoxetine to 20 mg daily Hold Abilify for 1 week then resume Abilify 1/2 of 15 mg tablet every other day for depression and mania continue Adderall  to XR 20 mg AM  10/27/2021 appointment with the following noted: I'm doing so much better.  Handling the depression better. Better self talk and spiritual focus has helped.   Dep 6/10 manifesting as anxiety with low confidence.   F died 2  years ago and M 65 yo and is dependent . She is working hard to feel better but still feels depressed.  She almost feels like she has a little more anxiety since restarting Abilify every other day. Plan: continue fluoxetine to 20 mg daily DC Abilify .  Vrayalar 1.5 mg QOD to try to get rid of depression ASAP. continue Adderall to XR 20 mg AM  11/10/2021 appointment with the following noted: Busy with Xmas and it was fun with family but then a big let down.  Did well with it.  Functioned well with it.  Working hard on things with depression.  Not shutting down. Not sure but feels better today but yesterday was hard.  Difficulty dealing with mother.  She won't do anything to help herself.  Yesterday with her all day.  Won't do PT and has isolated herself.    Lack of confidence.   No SE with Vraylar.  D Middie most like her.  Past Psychiatric Medication Trials: fluoxetine, duloxetine, Viibryd, lamotrigine, Pristiq, sertraline,  citalopram Adderall, Adderall XR, Vyvanse, Ritalin, Strattera low dose NR Lorazepam Trazodone Wellbutrin Depakote, lamotrigine cog complaints Lithium remotely Abilify 7.5  Vraylar 1.5 mg daily  At visit November 12, 2019. We discussed Patient developed an increasingly severe alcohol dependence problem since her last visit in January.  She went to [facility] and has had no alcohol since then except 1 day.  She never abused stimulants but they took her off the stimulants at [facility].  Her ADD was markedly worse.  The Wellbutrin did not helped the ADD.   Review of Systems:  Review of Systems  Constitutional:  Negative for unexpected weight change.  Musculoskeletal:  Positive for arthralgias, back pain and joint swelling.       SP hip surgery October 2020  Neurological:  Negative for tremors and weakness.  Psychiatric/Behavioral:  Positive for dysphoric mood. Negative for agitation, behavioral problems, confusion, decreased concentration, hallucinations, self-injury, sleep disturbance and suicidal ideas. The patient is not nervous/anxious and is not hyperactive.    Medications: I have reviewed the patient's current medications.  Current Outpatient Medications  Medication Sig Dispense Refill   amphetamine-dextroamphetamine (ADDERALL XR) 20 MG 24 hr capsule Take 1 capsule (20 mg total) by mouth every morning. 30 capsule 0   FLUoxetine (PROZAC) 20 MG capsule TAKE 1 CAPSULE BY MOUTH EVERY DAY 90 capsule 0   iron polysaccharides (NIFEREX) 150 MG capsule TAKE 1 CAPSULE BY MOUTH EVERY DAY 30 capsule 1   traZODone (DESYREL) 50 MG tablet TAKE 1 TABLET BY MOUTH AT BEDTIME AS NEEDED FOR SLEEP. 30 tablet 8   No current facility-administered medications for this visit.    Medication Side Effects: None  Allergies:  Allergies  Allergen Reactions   Metronidazole Shortness Of Breath and Other (See Comments)    Heart pounding   Ferrlecit [Na Ferric Gluc Cplx In Sucrose] Other (See  Comments)    Infusion reaction 05/12/2019    Past Medical History:  Diagnosis Date   ADHD    Anemia    Anxiety    Arthritis    Depression    Heart murmur    i went to see a cardiologit slast eyar  and i had zero  plaque,    PONV (postoperative nausea and vomiting)    Recovering alcoholic in remission (HCC)     Family History  Problem Relation Age of Onset   Atrial fibrillation Mother    CAD Father     Social History   Socioeconomic History   Marital status: Married    Spouse name: Not on file   Number of children: Not on file   Years of education: Not on file   Highest education level: Not on file  Occupational History   Not on file  Tobacco Use   Smoking status: Former    Types: Cigarettes    Quit date: 08/16/2003    Years since quitting: 18.2   Smokeless tobacco: Never   Tobacco comments:     08-28-2019 "i smoked 2 cigarettes in the last month since my father  passed"  Vaping Use   Vaping Use: Never used  Substance and Sexual Activity   Alcohol use: Yes    Alcohol/week: 10.0 standard drinks    Types: 10 Glasses of wine per week   Drug use: No   Sexual activity: Not on file  Other Topics Concern   Not on file  Social History Narrative   Not on file   Social Determinants of Health   Financial Resource Strain: Not on file  Food Insecurity: Not on file  Transportation Needs: Not on file  Physical Activity: Not on file  Stress: Not on file  Social Connections: Not on file  Intimate Partner Violence: Not on file    Past Medical History, Surgical history, Social history, and Family history were reviewed and updated as appropriate.   Please see review of systems for further details on the patient's review from today.   Objective:   Physical Exam:  BP 129/87    Pulse 81   Physical Exam Constitutional:      General: She is not in acute distress. Musculoskeletal:        General: No deformity.  Neurological:     Mental Status: She is alert and  oriented to person, place, and time.     Coordination: Coordination normal.     Gait: Gait normal.  Psychiatric:        Attention and Perception: Perception normal. She is attentive.        Mood and Affect: Mood is anxious and depressed. Affect is not labile, blunt, angry, tearful or inappropriate.        Speech: Speech is not rapid and pressured, slurred or tangential.        Behavior: Behavior is not agitated, slowed or aggressive.        Thought Content: Thought content normal. Thought content is not paranoid. Thought content does not include homicidal or suicidal ideation. Thought content does not include suicidal plan.        Cognition and Memory: Cognition normal.        Judgment: Judgment normal.     Comments: Insight intact. No auditory or visual hallucinations. No delusions.  Oppression is slightly better than the last visit but still moderately severe as well as a lot of anxiety.    Lab Review:     Component Value Date/Time   NA 137 01/12/2021 1430   NA 140 11/18/2018 1544   K 3.8 01/12/2021 1430   CL 108 01/12/2021 1430   CO2 22 01/12/2021 1430   GLUCOSE 94 01/12/2021 1430   BUN 14 01/12/2021 1430   BUN 20 11/18/2018 1544   CREATININE 0.82  01/12/2021 1430   CALCIUM 8.9 01/12/2021 1430   PROT 6.6 01/12/2021 1430   ALBUMIN 3.9 01/12/2021 1430   AST 12 (L) 01/12/2021 1430   ALT 11 01/12/2021 1430   ALKPHOS 46 01/12/2021 1430   BILITOT 0.5 01/12/2021 1430   GFRNONAA >60 01/12/2021 1430   GFRAA >60 09/02/2019 0249   GFRAA >60 01/27/2019 0811       Component Value Date/Time   WBC 4.5 01/12/2021 1430   RBC 4.32 01/12/2021 1430   HGB 12.8 01/12/2021 1430   HGB 12.9 07/17/2019 0953   HCT 38.5 01/12/2021 1430   HCT 21.9 (L) 12/25/2018 1221   PLT 272 01/12/2021 1430   PLT 286 07/17/2019 0953   MCV 89.1 01/12/2021 1430   MCH 29.6 01/12/2021 1430   MCHC 33.2 01/12/2021 1430   RDW 12.4 01/12/2021 1430   LYMPHSABS 1.4 01/12/2021 1430   MONOABS 0.4 01/12/2021 1430    EOSABS 0.0 01/12/2021 1430   BASOSABS 0.0 01/12/2021 1430    No results found for: POCLITH, LITHIUM   No results found for: PHENYTOIN, PHENOBARB, VALPROATE, CBMZ   .res Assessment: Plan:    Bipolar I disorder, most recent episode depressed (HCC)  Generalized anxiety disorder  Attention deficit hyperactivity disorder (ADHD), predominantly inattentive type  Insomnia due to mental condition   Greater than 50% of 30 min face to face time with patient was spent on counseling and coordination of care. We discussed how she was recently referred by her therapist because her therapist recognized mania.  Psychiatrist also verify the diagnosis.  The patient admitted recent mania which has resolved. Her mania was treated  and she has swung into a post-manic depression which initially resolved with Abilify and med changes.   However she has developed a post manic depression.  This was explained in detail.  Abilify has not been significantly helpful..    We discussed the pros and cons of how to approach this bipolar depression.  She would rather not be treated for bipolar disorder than to feel this way.  Post manic depression was explained in detail.  She is expected to fully recover.  However we would be unwise and against standard medical practice to treat her with a antidepressant without a mood stabilizer.  .  Discussed the alternative of Vraylar as well as several other options.  Discussed potential metabolic side effects associated with atypical antipsychotics, as well as potential risk for movement side effects. Advised pt to contact office if movement side effects occur. Answered her questions about Vraylar and its mood effects.  She was concerned it might possibly bring her down.  Discussed that it was just recently FDA approved for major depression.  Discussed potential benefits, risks, and side effects of stimulants with patient to include increased heart rate, palpitations, insomnia,  increased anxiety, increased irritability, or decreased appetite.  Instructed patient to contact office if experiencing any significant tolerability issues. She wants to return to usual dose of Adderall for ADD bc of mor poor cognitive function with reduction.  Also discussed that depression will impair cognitive function.  Has Maintain sobriety  Recognizes effect of anemia on ADD and mood.  Had iron infusions last winter.  continue fluoxetine to 20 mg daily  Vrayalar 1.5 mg QOD to try to get rid of depression  If not better in a week, then increase to 1 capsule daily. continue Adderall to XR 20 mg AM  No further prescriptions for benzodiazepines.  She agrees.  FU  2 weeks  urgently to be sure that she is improving.  Call if there is any worsening of depression  Meredith Staggers, MD, DFAPA     Please see After Visit Summary for patient specific instructions.  No future appointments.    No orders of the defined types were placed in this encounter.      -------------------------------

## 2021-11-11 ENCOUNTER — Other Ambulatory Visit: Payer: Self-pay | Admitting: Hematology

## 2021-11-11 ENCOUNTER — Other Ambulatory Visit: Payer: Self-pay | Admitting: Psychiatry

## 2021-11-11 DIAGNOSIS — F5105 Insomnia due to other mental disorder: Secondary | ICD-10-CM

## 2021-11-13 ENCOUNTER — Encounter: Payer: Self-pay | Admitting: Hematology

## 2021-11-20 ENCOUNTER — Other Ambulatory Visit: Payer: Self-pay

## 2021-11-20 ENCOUNTER — Telehealth: Payer: Self-pay | Admitting: Psychiatry

## 2021-11-20 DIAGNOSIS — F900 Attention-deficit hyperactivity disorder, predominantly inattentive type: Secondary | ICD-10-CM

## 2021-11-20 MED ORDER — AMPHETAMINE-DEXTROAMPHET ER 20 MG PO CP24: 20.0000 mg | ORAL_CAPSULE | ORAL | 0 refills | Status: DC

## 2021-11-20 NOTE — Telephone Encounter (Signed)
Patient rtc informing CR that her pharmacy has her Adderall 20mg   in stock. Would like sent over ASAP. Ph: [phone]. Appt 1/13. Pharmacy [facility] [address]

## 2021-11-20 NOTE — Telephone Encounter (Signed)
Pended.

## 2021-11-24 ENCOUNTER — Other Ambulatory Visit: Payer: Self-pay

## 2021-11-24 ENCOUNTER — Encounter: Payer: Self-pay | Admitting: Psychiatry

## 2021-11-24 ENCOUNTER — Ambulatory Visit (INDEPENDENT_AMBULATORY_CARE_PROVIDER_SITE_OTHER): Payer: 59 | Admitting: Psychiatry

## 2021-11-24 DIAGNOSIS — F411 Generalized anxiety disorder: Secondary | ICD-10-CM

## 2021-11-24 DIAGNOSIS — F5105 Insomnia due to other mental disorder: Secondary | ICD-10-CM | POA: Diagnosis not present

## 2021-11-24 DIAGNOSIS — F900 Attention-deficit hyperactivity disorder, predominantly inattentive type: Secondary | ICD-10-CM | POA: Diagnosis not present

## 2021-11-24 DIAGNOSIS — F3130 Bipolar disorder, current episode depressed, mild or moderate severity, unspecified: Secondary | ICD-10-CM

## 2021-11-24 NOTE — Progress Notes (Signed)
JASSMINE Chang [id number] October 13, 1956 66 y.o.  Subjective:   Patient ID:  Laura Chang is a 66 y.o. (DOB 1956/08/17) female.  Chief Complaint:  Chief Complaint  Patient presents with   Follow-up   Depression   Anxiety    HPI Laura Chang presents to the office today for follow-up of depression and anxiety and ADD.  seen November 12, 2019.  Melted down in 2020.  Went to [facility] in July.  No withdrawal.  1 drink since then.  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 She was on no stimulant and no SSRI but was taking Strattera and Wellbutrin.  The following changes were made. Stop Strattera. OK restart stimulant bc severe ADD Restart Adderall 1 daily for a few days and if tolerated then restart 1 twice daily. If not tolerated reduce the dosage if needed. May need to stop Wellbutrin if not tolerating the stimulant.  Yes.  DC Wellbutrin Restart Prozac 20 mg daily.  February 2021 appointment with the following noted: Completed grant proposal.  Couldn't doit without Adderall.  Sold a bunch of work.   Adderall XR lasts about 3 pm.  Strength seems about right.  BP been OK.  Not jittery.   Stopped Wellbutrin but had no SE. Mood drastically better with grant proposal and back on fluoxetine.  Less depressed and lethargic.  No anxiety.  Cut back on coffee. Started back with devotions and stronger faith. Plan: Continue Prozac 20 mg daily. May have to increase the dose at some point in the future given that she usually was taking higher dosages but she is getting good response at this time. Restart Wellbutrin off label for ADD since can't get 2 ADDERALL daily. 150 mg daily then 300 mg daily. She can adjust the dose between 150 mg and 300 mg daily to get the optimal effect.   05/11/2020 appointment with the following noted: Has been inconsistent with Prozac and Wellbutrin. Not sure of the effect of Wellbutrin. Biggest deterrent in work is anxiety.  Some of  the work is conceptual and difficult at times.  Can feel she's not up to a project at times.  Overall is OK but would like a steadier benefit from stimulant.  Exhausted from managing concentration and keeping up with things from the day.  Loses things.  Not good keeping up with schedule. Overall productive and emotionally OK. Can feel Adderall wear off. Mood is better in summer and worse in the winter.   F died in October 05, 2023 and that is a loss. No SE Wellbutrin. Still attends AA meetings.  Real benefit from [facility] last year. Recognizes effect of anemia on ADD and mood.  Had iron infusions last winter. Plan:  Wellbutrin off label for ADD since can't get 2 ADDERALL daily. 150 mg daily then 300 mg daily.  01/24/2021 appointment with following noted: Doing a program called [employer] mindfulness app since Xmas.  CBT app helped the depression.  App helped her focus better.  Lost sign weight. Writing a lot. Before Xmas felt depressed and started negative thinking worse, self denigrating. Not drinking. More isolated.   Recognizes mo is narcissist.    Didn't tell anyone she was born until 3 mos later.  M aloof and uninterested in pt.  Lied about her birthday.  Mo lack of affection even with pt's kids. Going to AA for a year and it helped her to quit drinking. Also misses kids being gone with a hole also.  Plan: No med changes  05/04/2021 appointment with the following noted: Therapist [name] thinks she's manic. Lost weight to 144#.   States she is still sleeping okay.  Admits she is hyper and recognizes that she is likely manic.  She feels great, euphoric with an increased sense of spiritual connectedness to God.  She has racing thoughts and talks fast and talks a lot and this is noted by her husband.  He thinks she is a bit hyper.  She has been able to maintain sobriety although she will have 1 glass of wine on special occasions but does not drink by herself.  She is not drinking to excess.   She denies any dangerous impulsivity.  She is clearly not depressed and not particularly anxious.  She has no concerns about her medication and she has been compliant.  06/16/21 appt noted: So much better.  Going through a lot but the manic thing happened on top of it.  So much slower.  Didn't feel like losing anything with risperidone.  Likes the Adderalll at 10 mg. Some drowsiness in the AM and very drowsy from risperidone 2 mg HS. Prayer life is better. Handling stress better. Less depressed with risperidone. Still likes trazodone. Sleeps well. Plan: Reduce Prozac to 10 mg daily.  Consider stopping it because it can feel the mania however she is reluctant to do that because she fears relapse of depression. Reduce risperidone to 1.5 mg nightly due to side effects.  Discussed risk of worsening mania.  07/25/2021 appointment with the following noted: Misses the Adderall and hard to function without it. Depressed now. Heavy chest.  Anxious and guilty.  Body feels heavy.   Hates Wellbutrin.   Plan: Increase fluoxetine to 20 mg daily Add Abilify 1/2 of 15 mg tablet daily Wean wellbutrin by 1 tablet each week  bc she feels it is not helpful and DT polypharmacy Reduce risperidone to 1 daily for 1 week and stop it. Disc risk of mania. Increase Adderall to XR 20 mg AM  08/08/21 Much less depressed and starting to feel normal I feel a lot better. No SE.  Speech normal off risperidone. Sleeping OK on trazaodone and enough.   Noticed benefit from Adderall again. Plan: continue fluoxetine to 20 mg daily Continue Abilify 1/2 of 15 mg tablet daily for depression and mania continue Adderall to XR 20 mg AM  10/10/2021 phone call: Pt stated she feels like the Abilify should be decreased to 5mg .She said she is depressed but rational and not suicidal.She has an appt Monday and can wait until then if you prefer. MD response: Reduce the Abilify to 7.5 mg every other day.  We will meet on 10/16/2021 and decide  what to do from there.  10/16/2021 appointment with the following noted: More depressed.  Most depressed I've ever been.  Just numb.  Sense of grief.   Thinks the manic episode was unlike anything else she ever had.  Doesn't want to medicate against it.  Don't enjoy people.  Easily overwhelmed.  Had some death thoughts but not suicidal.  Has been functional.  Feels better today after reducing Abilify to every other day but she is only been doing that for 3 days. A/P: Episode of post manic depression was explained. continue fluoxetine to 20 mg daily Hold Abilify for 1 week then resume Abilify 1/2 of 15 mg tablet every other day for depression and mania continue Adderall to XR 20 mg AM  10/27/2021 appointment with the following  noted: I'm doing so much better.  Handling the depression better. Better self talk and spiritual focus has helped.   Dep 6/10 manifesting as anxiety with low confidence.   F died 2  years ago and M 66 yo and is dependent . She is working hard to feel better but still feels depressed.  She almost feels like she has a little more anxiety since restarting Abilify every other day. Plan: continue fluoxetine to 20 mg daily DC Abilify .  Vrayalar 1.5 mg QOD to try to get rid of depression ASAP. continue Adderall to XR 20 mg AM  11/10/2021 appointment with the following noted: Busy with Xmas and it was fun with family but then a big let down.  Did well with it.  Functioned well with it.  Working hard on things with depression.  Not shutting down. Not sure but feels better today but yesterday was hard.  Difficulty dealing with mother.  She won't do anything to help herself.  Yesterday with her all day.  Won't do PT and has isolated herself.    Lack of confidence.   No SE with Vraylar.  11/24/21 urgent appointment appt noted: More and more depressed.   So anxious and doesn't want to be alone but can do so. No appetite. Hurts inside. Has had some fleeting suicidal thoughts but  would not act on them.  Tolerating meds. Has been consistent with Vraylar 1.5 mg every other day, fluoxetine 20 mg daily  D Middie most like her.  Past Psychiatric Medication Trials: fluoxetine, duloxetine, Viibryd, lamotrigine, Pristiq, sertraline, citalopram Adderall, Adderall XR, Vyvanse, Ritalin, Strattera low dose NR Lorazepam Trazodone Wellbutrin Depakote,  lamotrigine cog complaints Lithium remotely Abilify 7.5  Vraylar 1.5 mg daily  At visit November 12, 2019. We discussed Patient developed an increasingly severe alcohol dependence problem since her last visit in January.  She went to [facility] and has had no alcohol since then except 1 day.  She never abused stimulants but they took her off the stimulants at [facility].  Her ADD was markedly worse.  The Wellbutrin did not helped the ADD.   Review of Systems:  Review of Systems  Constitutional:  Negative for unexpected weight change.  Musculoskeletal:  Positive for arthralgias, back pain and joint swelling.       SP hip surgery October 2020  Neurological:  Negative for tremors and weakness.  Psychiatric/Behavioral:  Positive for dysphoric mood. Negative for agitation, behavioral problems, confusion, decreased concentration, hallucinations, self-injury, sleep disturbance and suicidal ideas. The patient is nervous/anxious. The patient is not hyperactive.    Medications: I have reviewed the patient's current medications.  Current Outpatient Medications  Medication Sig Dispense Refill   amphetamine-dextroamphetamine (ADDERALL XR) 20 MG 24 hr capsule Take 1 capsule (20 mg total) by mouth every morning. 30 capsule 0   cariprazine (VRAYLAR) 1.5 MG capsule Take 1.5 mg by mouth every other day.     FLUoxetine (PROZAC) 20 MG capsule TAKE 1 CAPSULE BY MOUTH EVERY DAY 90 capsule 0   iron polysaccharides (NIFEREX) 150 MG capsule TAKE 1 CAPSULE BY MOUTH EVERY DAY 30 capsule 1   traZODone (DESYREL) 50 MG tablet TAKE 1 TABLET BY  MOUTH AT BEDTIME AS NEEDED FOR SLEEP. 30 tablet 8   No current facility-administered medications for this visit.    Medication Side Effects: None  Allergies:  Allergies  Allergen Reactions   Metronidazole Shortness Of Breath and Other (See Comments)    Heart pounding   Ferrlecit [  Na Ferric Gluc Cplx In Sucrose] Other (See Comments)    Infusion reaction 05/12/2019    Past Medical History:  Diagnosis Date   ADHD    Anemia    Anxiety    Arthritis    Depression    Heart murmur    i went to see a cardiologit slast eyar  and i had zero plaque,    PONV (postoperative nausea and vomiting)    Recovering alcoholic in remission (HCC)     Family History  Problem Relation Age of Onset   Atrial fibrillation Mother    CAD Father     Social History   Socioeconomic History   Marital status: Married    Spouse name: Not on file   Number of children: Not on file   Years of education: Not on file   Highest education level: Not on file  Occupational History   Not on file  Tobacco Use   Smoking status: Former    Types: Cigarettes    Quit date: 08/16/2003    Years since quitting: 18.2   Smokeless tobacco: Never   Tobacco comments:     08-28-2019 "i smoked 2 cigarettes in the last month since my father  passed"  Vaping Use   Vaping Use: Never used  Substance and Sexual Activity   Alcohol use: Yes    Alcohol/week: 10.0 standard drinks    Types: 10 Glasses of wine per week   Drug use: No   Sexual activity: Not on file  Other Topics Concern   Not on file  Social History Narrative   Not on file   Social Determinants of Health   Financial Resource Strain: Not on file  Food Insecurity: Not on file  Transportation Needs: Not on file  Physical Activity: Not on file  Stress: Not on file  Social Connections: Not on file  Intimate Partner Violence: Not on file    Past Medical History, Surgical history, Social history, and Family history were reviewed and updated as appropriate.    Please see review of systems for further details on the patient's review from today.   Objective:   Physical Exam:  There were no vitals taken for this visit.  Physical Exam Constitutional:      General: She is not in acute distress. Musculoskeletal:        General: No deformity.  Neurological:     Mental Status: She is alert and oriented to person, place, and time.     Coordination: Coordination normal.     Gait: Gait normal.  Psychiatric:        Attention and Perception: Perception normal. She is attentive.        Mood and Affect: Mood is anxious and depressed. Affect is not labile, angry, tearful or inappropriate.        Speech: Speech is not rapid and pressured, slurred or tangential.        Behavior: Behavior is not agitated, slowed or aggressive.        Thought Content: Thought content normal. Thought content is not paranoid. Thought content does not include homicidal or suicidal ideation. Thought content does not include suicidal plan.        Cognition and Memory: Cognition normal.        Judgment: Judgment normal.     Comments: Insight intact. No auditory or visual hallucinations. No delusions.  Oppression i ongoing.    Lab Review:     Component Value Date/Time   NA 137 01/12/2021  1430   NA 140 11/18/2018 1544   K 3.8 01/12/2021 1430   CL 108 01/12/2021 1430   CO2 22 01/12/2021 1430   GLUCOSE 94 01/12/2021 1430   BUN 14 01/12/2021 1430   BUN 20 11/18/2018 1544   CREATININE 0.82 01/12/2021 1430   CALCIUM 8.9 01/12/2021 1430   PROT 6.6 01/12/2021 1430   ALBUMIN 3.9 01/12/2021 1430   AST 12 (L) 01/12/2021 1430   ALT 11 01/12/2021 1430   ALKPHOS 46 01/12/2021 1430   BILITOT 0.5 01/12/2021 1430   GFRNONAA >60 01/12/2021 1430   GFRAA >60 09/02/2019 0249   GFRAA >60 01/27/2019 0811       Component Value Date/Time   WBC 4.5 01/12/2021 1430   RBC 4.32 01/12/2021 1430   HGB 12.8 01/12/2021 1430   HGB 12.9 07/17/2019 0953   HCT 38.5 01/12/2021 1430   HCT  21.9 (L) 12/25/2018 1221   PLT 272 01/12/2021 1430   PLT 286 07/17/2019 0953   MCV 89.1 01/12/2021 1430   MCH 29.6 01/12/2021 1430   MCHC 33.2 01/12/2021 1430   RDW 12.4 01/12/2021 1430   LYMPHSABS 1.4 01/12/2021 1430   MONOABS 0.4 01/12/2021 1430   EOSABS 0.0 01/12/2021 1430   BASOSABS 0.0 01/12/2021 1430    No results found for: POCLITH, LITHIUM   No results found for: PHENYTOIN, PHENOBARB, VALPROATE, CBMZ   .res Assessment: Plan:    Bipolar I disorder, most recent episode depressed (HCC)  Generalized anxiety disorder  Attention deficit hyperactivity disorder (ADHD), predominantly inattentive type  Insomnia due to mental condition   Greater than 50% of 30 min face to face time with patient was spent on counseling and coordination of care. We discussed how she was recently referred by her therapist because her therapist recognized mania.  Psychiatrist also verify the diagnosis.  The patient admitted recent mania which has resolved. Her mania was treated  and she has swung into a post-manic depression which initially resolved with Abilify and med changes.   However she has developed a post manic depression.  This was explained in detail.    We discussed the pros and cons of how to approach this bipolar depression.  She would rather not be treated for bipolar disorder than to feel this way.  Post manic depression was explained in detail.  She is expected to fully recover.  However we would be unwise and against standard medical practice to treat her with a antidepressant without a mood stabilizer.  Marland Kitchen.    Unfortunately she remains depressed.  No significant improvement with Vraylar 1.5 mg every other day added to the Prozac 20 mg daily.  She is having no side effects Discussed alternative antidepressant as well as the possibility of alternative mood stabilizer such as Latuda.  However this may be failing to work because of under dosing.  Increase Vraylar to 1.5 mg daily Change  Prozac to Trintellix 10 mg daily. Discussed side effects of each continue Adderall to XR 20 mg AM  Discussed potential metabolic side effects associated with atypical antipsychotics, as well as potential risk for movement side effects. Advised pt to contact office if movement side effects occur. Answered her questions about Vraylar and its mood effects.  She was concerned it might possibly bring her down.  Discussed that it was just recently FDA approved for major depression.  Discussed potential benefits, risks, and side effects of stimulants with patient to include increased heart rate, palpitations, insomnia, increased anxiety, increased irritability, or decreased  appetite.  Instructed patient to contact office if experiencing any significant tolerability issues. She wants to return to usual dose of Adderall for ADD bc of mor poor cognitive function with reduction.  Also discussed that depression will impair cognitive function.  Has Maintain sobriety  Recognizes effect of anemia on ADD and mood.  Had iron infusions last winter.    FU  3 weeks urgently to be sure that she is improving.  Call if there is any worsening of depression  Meredith Staggers, MD, DFAPA     Please see After Visit Summary for patient specific instructions.  Future Appointments  Date Time Provider Department Center  12/27/2021 11:00 AM Cottle, Steva Ready., MD [facility] None      No orders of the defined types were placed in this encounter.      -------------------------------

## 2021-12-12 ENCOUNTER — Other Ambulatory Visit: Payer: Self-pay | Admitting: Psychiatry

## 2021-12-12 DIAGNOSIS — F5105 Insomnia due to other mental disorder: Secondary | ICD-10-CM

## 2021-12-16 ENCOUNTER — Other Ambulatory Visit: Payer: Self-pay | Admitting: Hematology

## 2021-12-17 ENCOUNTER — Encounter: Payer: Self-pay | Admitting: Hematology

## 2021-12-18 ENCOUNTER — Telehealth: Payer: Self-pay | Admitting: Psychiatry

## 2021-12-18 NOTE — Telephone Encounter (Signed)
Pt informed samples pulled  °

## 2021-12-18 NOTE — Telephone Encounter (Signed)
She can have 2 boxes of Trintellix 10 mg and 2 boxes of Vraylar 1.5 mg to carry her until her appointment.

## 2021-12-18 NOTE — Telephone Encounter (Signed)
Patient lvm stating that she was given samples of her medication and she is almost out. States that she will be out before appt 2/15. Could she come and pick up more samples of. Trintellix 10mg  and Vraylar 1.5mg . Pls rtc [phone]. States if need be can call prescription in. Pls rtc to discuss [phone]

## 2021-12-18 NOTE — Telephone Encounter (Signed)
Should we give her samples or send a RX?

## 2021-12-20 ENCOUNTER — Telehealth: Payer: Self-pay | Admitting: Psychiatry

## 2021-12-20 ENCOUNTER — Other Ambulatory Visit: Payer: Self-pay

## 2021-12-20 DIAGNOSIS — F900 Attention-deficit hyperactivity disorder, predominantly inattentive type: Secondary | ICD-10-CM

## 2021-12-20 MED ORDER — AMPHETAMINE-DEXTROAMPHET ER 20 MG PO CP24: 20.0000 mg | ORAL_CAPSULE | ORAL | 0 refills | Status: DC

## 2021-12-20 NOTE — Telephone Encounter (Signed)
Pt called requesting refill of Adderall to [facility].  They have it available.  Next appt 2/15

## 2021-12-20 NOTE — Telephone Encounter (Signed)
Pended.

## 2021-12-27 ENCOUNTER — Encounter: Payer: Self-pay | Admitting: Psychiatry

## 2021-12-27 ENCOUNTER — Other Ambulatory Visit: Payer: Self-pay

## 2021-12-27 ENCOUNTER — Ambulatory Visit: Payer: 59 | Admitting: Psychiatry

## 2021-12-27 DIAGNOSIS — F3130 Bipolar disorder, current episode depressed, mild or moderate severity, unspecified: Secondary | ICD-10-CM

## 2021-12-27 DIAGNOSIS — F900 Attention-deficit hyperactivity disorder, predominantly inattentive type: Secondary | ICD-10-CM | POA: Diagnosis not present

## 2021-12-27 DIAGNOSIS — F5105 Insomnia due to other mental disorder: Secondary | ICD-10-CM | POA: Diagnosis not present

## 2021-12-27 DIAGNOSIS — F411 Generalized anxiety disorder: Secondary | ICD-10-CM | POA: Diagnosis not present

## 2021-12-27 MED ORDER — CLONIDINE HCL 0.1 MG PO TABS: 0.1000 mg | ORAL_TABLET | Freq: Two times a day (BID) | ORAL | 0 refills | Status: DC

## 2021-12-27 MED ORDER — LORAZEPAM 1 MG PO TABS: 1.0000 mg | ORAL_TABLET | Freq: Three times a day (TID) | ORAL | 0 refills | Status: DC

## 2021-12-27 NOTE — Progress Notes (Signed)
AUSTRALIA PENNIMAN [id number] 06/17/1956 66 y.o.  Subjective:   Patient ID:  Laura Chang is a 66 y.o. (DOB Jul 27, 1956) female.  Chief Complaint:  Chief Complaint  Patient presents with   Follow-up    Bipolar I disorder, most recent episode depressed (HCC)   Depression    HPI Laura Chang presents to the office today for follow-up of depression and anxiety and ADD.  seen November 12, 2019.  Melted down in 2020.  Went to [facility] in July.  No withdrawal.  1 drink since then.  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 She was on no stimulant and no SSRI but was taking Strattera and Wellbutrin.  The following changes were made. Stop Strattera. OK restart stimulant bc severe ADD Restart Adderall 1 daily for a few days and if tolerated then restart 1 twice daily. If not tolerated reduce the dosage if needed. May need to stop Wellbutrin if not tolerating the stimulant.  Yes.  DC Wellbutrin Restart Prozac 20 mg daily.  February 2021 appointment with the following noted: Completed grant proposal.  Couldn't doit without Adderall.  Sold a bunch of work.   Adderall XR lasts about 3 pm.  Strength seems about right.  BP been OK.  Not jittery.   Stopped Wellbutrin but had no SE. Mood drastically better with grant proposal and back on fluoxetine.  Less depressed and lethargic.  No anxiety.  Cut back on coffee. Started back with devotions and stronger faith. Plan: Continue Prozac 20 mg daily. May have to increase the dose at some point in the future given that she usually was taking higher dosages but she is getting good response at this time. Restart Wellbutrin off label for ADD since can't get 2 ADDERALL daily. 150 mg daily then 300 mg daily. She can adjust the dose between 150 mg and 300 mg daily to get the optimal effect.   05/11/2020 appointment with the following noted: Has been inconsistent with Prozac and Wellbutrin. Not sure of the effect of  Wellbutrin. Biggest deterrent in work is anxiety.  Some of the work is conceptual and difficult at times.  Can feel she's not up to a project at times.  Overall is OK but would like a steadier benefit from stimulant.  Exhausted from managing concentration and keeping up with things from the day.  Loses things.  Not good keeping up with schedule. Overall productive and emotionally OK. Can feel Adderall wear off. Mood is better in summer and worse in the winter.   F died in 2023/10/22 and that is a loss. No SE Wellbutrin. Still attends AA meetings.  Real benefit from [facility] last year. Recognizes effect of anemia on ADD and mood.  Had iron infusions last winter. Plan:  Wellbutrin off label for ADD since can't get 2 ADDERALL daily. 150 mg daily then 300 mg daily.  01/24/2021 appointment with following noted: Doing a program called [employer] mindfulness app since Xmas.  CBT app helped the depression.  App helped her focus better.  Lost sign weight. Writing a lot. Before Xmas felt depressed and started negative thinking worse, self denigrating. Not drinking. More isolated.   Recognizes mo is narcissist.    Didn't tell anyone she was born until 3 mos later.  M aloof and uninterested in pt.  Lied about her birthday.  Mo lack of affection even with pt's kids. Going to AA for a year and it helped her to quit drinking. Also  misses kids being gone with a hole also. Plan: No med changes  05/04/2021 appointment with the following noted: Therapist [name] thinks she's manic. Lost weight to 144#.   States she is still sleeping okay.  Admits she is hyper and recognizes that she is likely manic.  She feels great, euphoric with an increased sense of spiritual connectedness to God.  She has racing thoughts and talks fast and talks a lot and this is noted by her husband.  He thinks she is a bit hyper.  She has been able to maintain sobriety although she will have 1 glass of wine on special occasions but  does not drink by herself.  She is not drinking to excess.  She denies any dangerous impulsivity.  She is clearly not depressed and not particularly anxious.  She has no concerns about her medication and she has been compliant.  06/16/21 appt noted: So much better.  Going through a lot but the manic thing happened on top of it.  So much slower.  Didn't feel like losing anything with risperidone.  Likes the Adderalll at 10 mg. Some drowsiness in the AM and very drowsy from risperidone 2 mg HS. Prayer life is better. Handling stress better. Less depressed with risperidone. Still likes trazodone. Sleeps well. Plan: Reduce Prozac to 10 mg daily.  Consider stopping it because it can feel the mania however she is reluctant to do that because she fears relapse of depression. Reduce risperidone to 1.5 mg nightly due to side effects.  Discussed risk of worsening mania.  07/25/2021 appointment with the following noted: Misses the Adderall and hard to function without it. Depressed now. Heavy chest.  Anxious and guilty.  Body feels heavy.   Hates Wellbutrin.   Plan: Increase fluoxetine to 20 mg daily Add Abilify 1/2 of 15 mg tablet daily Wean wellbutrin by 1 tablet each week  bc she feels it is not helpful and DT polypharmacy Reduce risperidone to 1 daily for 1 week and stop it. Disc risk of mania. Increase Adderall to XR 20 mg AM  08/08/21 Much less depressed and starting to feel normal I feel a lot better. No SE.  Speech normal off risperidone. Sleeping OK on trazaodone and enough.   Noticed benefit from Adderall again. Plan: continue fluoxetine to 20 mg daily Continue Abilify 1/2 of 15 mg tablet daily for depression and mania continue Adderall to XR 20 mg AM  10/10/2021 phone call: Pt stated she feels like the Abilify should be decreased to 5mg .She said she is depressed but rational and not suicidal.She has an appt Monday and can wait until then if you prefer. MD response: Reduce the Abilify to  7.5 mg every other day.  We will meet on 10/16/2021 and decide what to do from there.  10/16/2021 appointment with the following noted: More depressed.  Most depressed I've ever been.  Just numb.  Sense of grief.   Thinks the manic episode was unlike anything else she ever had.  Doesn't want to medicate against it.  Don't enjoy people.  Easily overwhelmed.  Had some death thoughts but not suicidal.  Has been functional.  Feels better today after reducing Abilify to every other day but she is only been doing that for 3 days. A/P: Episode of post manic depression was explained. continue fluoxetine to 20 mg daily Hold Abilify for 1 week then resume Abilify 1/2 of 15 mg tablet every other day for depression and mania continue Adderall to XR 20  mg AM  10/27/2021 appointment with the following noted: I'm doing so much better.  Handling the depression better. Better self talk and spiritual focus has helped.   Dep 6/10 manifesting as anxiety with low confidence.   F died 2  years ago and M 65 yo and is dependent . She is working hard to feel better but still feels depressed.  She almost feels like she has a little more anxiety since restarting Abilify every other day. Plan: continue fluoxetine to 20 mg daily DC Abilify .  Vrayalar 1.5 mg QOD to try to get rid of depression ASAP. continue Adderall to XR 20 mg AM  11/10/2021 appointment with the following noted: Busy with Xmas and it was fun with family but then a big let down.  Did well with it.  Functioned well with it.  Working hard on things with depression.  Not shutting down. Not sure but feels better today but yesterday was hard.  Difficulty dealing with mother.  She won't do anything to help herself.  Yesterday with her all day.  Won't do PT and has isolated herself.    Lack of confidence.   No SE with Vraylar.  11/24/21 urgent appointment appt noted: More and more depressed.   So anxious and doesn't want to be alone but can do so. No  appetite. Hurts inside. Has had some fleeting suicidal thoughts but would not act on them.  Tolerating meds. Has been consistent with Vraylar 1.5 mg every other day, fluoxetine 20 mg daily Plan: Increase Vraylar to 1.5 mg daily Change Prozac to Trintellix 10 mg daily. Discussed side effects of each continue Adderall to XR 20 mg AM  12/27/2021 appointment with the following noted: Not OK.  I feel less depressed but feels bat shit. Not sleeping well.  Extremely anxious. Off and on sleep. 3-4 hours of sleep.   Still having daily SI.  But also become obvious has so much to do.  Overwhelmed by tasks.   Needs anxiety meds to just function. Not more motivated.  Walked yesterday.   Feels afraid like in trouble but not irritable or angry.   D Middie most like her.  Past Psychiatric Medication Trials: fluoxetine, duloxetine, Viibryd, lamotrigine, Pristiq, sertraline, citalopram Adderall, Adderall XR, Vyvanse, Ritalin, Strattera low dose NR Lorazepam Trazodone Wellbutrin Depakote,  lamotrigine cog complaints Lithium remotely Abilify 7.5  Vraylar 1.5 mg daily  At visit November 12, 2019. We discussed Patient developed an increasingly severe alcohol dependence problem since her last visit in January.  She went to [facility] and has had no alcohol since then except 1 day.  She never abused stimulants but they took her off the stimulants at [facility].  Her ADD was markedly worse.  The Wellbutrin did not helped the ADD.   Review of Systems:  Review of Systems  Constitutional:  Negative for unexpected weight change.  Musculoskeletal:  Positive for arthralgias, back pain and joint swelling.       SP hip surgery October 2020  Neurological:  Negative for tremors and weakness.  Psychiatric/Behavioral:  Positive for dysphoric mood, sleep disturbance and suicidal ideas. Negative for agitation, behavioral problems, confusion, decreased concentration, hallucinations and self-injury. The  patient is nervous/anxious. The patient is not hyperactive.    Medications: I have reviewed the patient's current medications.  Current Outpatient Medications  Medication Sig Dispense Refill   amphetamine-dextroamphetamine (ADDERALL XR) 20 MG 24 hr capsule Take 1 capsule (20 mg total) by mouth every morning. 30 capsule 0  cloNIDine (CATAPRES) 0.1 MG tablet Take 1 tablet (0.1 mg total) by mouth 2 (two) times daily. 60 tablet 0   iron polysaccharides (NIFEREX) 150 MG capsule TAKE 1 CAPSULE BY MOUTH EVERY DAY 30 capsule 1   LORazepam (ATIVAN) 1 MG tablet Take 1 tablet (1 mg total) by mouth every 8 (eight) hours. 30 tablet 0   traZODone (DESYREL) 50 MG tablet TAKE 1 TABLET BY MOUTH EVERY DAY AT BEDTIME AS NEEDED FOR SLEEP 90 tablet 1   vortioxetine HBr (TRINTELLIX) 10 MG TABS tablet Take 10 mg by mouth daily.     FLUoxetine (PROZAC) 20 MG capsule TAKE 1 CAPSULE BY MOUTH EVERY DAY (Patient not taking: Reported on 12/27/2021) 90 capsule 0   No current facility-administered medications for this visit.    Medication Side Effects: None  Allergies:  Allergies  Allergen Reactions   Metronidazole Shortness Of Breath and Other (See Comments)    Heart pounding   Ferrlecit [Na Ferric Gluc Cplx In Sucrose] Other (See Comments)    Infusion reaction 05/12/2019    Past Medical History:  Diagnosis Date   ADHD    Anemia    Anxiety    Arthritis    Depression    Heart murmur    i went to see a cardiologit slast eyar  and i had zero plaque,    PONV (postoperative nausea and vomiting)    Recovering alcoholic in remission (HCC)     Family History  Problem Relation Age of Onset   Atrial fibrillation Mother    CAD Father     Social History   Socioeconomic History   Marital status: Married    Spouse name: Not on file   Number of children: Not on file   Years of education: Not on file   Highest education level: Not on file  Occupational History   Not on file  Tobacco Use   Smoking status:  Former    Types: Cigarettes    Quit date: 08/16/2003    Years since quitting: 18.3   Smokeless tobacco: Never   Tobacco comments:     08-28-2019 "i smoked 2 cigarettes in the last month since my father  passed"  Vaping Use   Vaping Use: Never used  Substance and Sexual Activity   Alcohol use: Yes    Alcohol/week: 10.0 standard drinks    Types: 10 Glasses of wine per week   Drug use: No   Sexual activity: Not on file  Other Topics Concern   Not on file  Social History Narrative   Not on file   Social Determinants of Health   Financial Resource Strain: Not on file  Food Insecurity: Not on file  Transportation Needs: Not on file  Physical Activity: Not on file  Stress: Not on file  Social Connections: Not on file  Intimate Partner Violence: Not on file    Past Medical History, Surgical history, Social history, and Family history were reviewed and updated as appropriate.   Please see review of systems for further details on the patient's review from today.   Objective:   Physical Exam:  There were no vitals taken for this visit.  Physical Exam Constitutional:      General: She is not in acute distress. Musculoskeletal:        General: No deformity.  Neurological:     Mental Status: She is alert and oriented to person, place, and time.     Coordination: Coordination normal.     Gait:  Gait normal.  Psychiatric:        Attention and Perception: Perception normal. She is attentive.        Mood and Affect: Mood is anxious and depressed. Affect is not labile, angry, tearful or inappropriate.        Speech: Speech is not rapid and pressured, slurred or tangential.        Behavior: Behavior is not agitated, slowed or aggressive.        Thought Content: Thought content is not paranoid. Thought content includes suicidal ideation. Thought content does not include homicidal ideation. Thought content does not include suicidal plan.        Cognition and Memory: Cognition normal.         Judgment: Judgment normal.     Comments: Insight intact. No auditory or visual hallucinations. No delusions.  Depression i ongoing. Anxiety worse    Lab Review:     Component Value Date/Time   NA 137 01/12/2021 1430   NA 140 11/18/2018 1544   K 3.8 01/12/2021 1430   CL 108 01/12/2021 1430   CO2 22 01/12/2021 1430   GLUCOSE 94 01/12/2021 1430   BUN 14 01/12/2021 1430   BUN 20 11/18/2018 1544   CREATININE 0.82 01/12/2021 1430   CALCIUM 8.9 01/12/2021 1430   PROT 6.6 01/12/2021 1430   ALBUMIN 3.9 01/12/2021 1430   AST 12 (L) 01/12/2021 1430   ALT 11 01/12/2021 1430   ALKPHOS 46 01/12/2021 1430   BILITOT 0.5 01/12/2021 1430   GFRNONAA >60 01/12/2021 1430   GFRAA >60 09/02/2019 0249   GFRAA >60 01/27/2019 0811       Component Value Date/Time   WBC 4.5 01/12/2021 1430   RBC 4.32 01/12/2021 1430   HGB 12.8 01/12/2021 1430   HGB 12.9 07/17/2019 0953   HCT 38.5 01/12/2021 1430   HCT 21.9 (L) 12/25/2018 1221   PLT 272 01/12/2021 1430   PLT 286 07/17/2019 0953   MCV 89.1 01/12/2021 1430   MCH 29.6 01/12/2021 1430   MCHC 33.2 01/12/2021 1430   RDW 12.4 01/12/2021 1430   LYMPHSABS 1.4 01/12/2021 1430   MONOABS 0.4 01/12/2021 1430   EOSABS 0.0 01/12/2021 1430   BASOSABS 0.0 01/12/2021 1430    No results found for: POCLITH, LITHIUM   No results found for: PHENYTOIN, PHENOBARB, VALPROATE, CBMZ   .res Assessment: Plan:    Bipolar I disorder, most recent episode depressed (HCC) - Plan: LORazepam (ATIVAN) 1 MG tablet, cloNIDine (CATAPRES) 0.1 MG tablet  Generalized anxiety disorder - Plan: LORazepam (ATIVAN) 1 MG tablet, cloNIDine (CATAPRES) 0.1 MG tablet  Attention deficit hyperactivity disorder (ADHD), predominantly inattentive type  Insomnia due to mental condition - Plan: LORazepam (ATIVAN) 1 MG tablet, cloNIDine (CATAPRES) 0.1 MG tablet   Greater than 50% of 30 min face to face time with patient was spent on counseling and coordination of care. We discussed  how she was recently referred by her therapist because her therapist recognized mania.  Psychiatrist also verify the diagnosis.  The patient admitted recent mania which has resolved. Her mania was treated  and she has swung into a post-manic depression which initially resolved with Abilify and med changes.   However she has developed a post manic depression.  This was explained in detail.    We discussed the pros and cons of how to approach this bipolar depression.  She would rather not be treated for bipolar disorder than to feel this way.  Post manic depression was explained  in detail.  She is expected to fully recover.  However we would be unwise and against standard medical practice to treat her with a antidepressant without a mood stabilizer.  Marland Kitchen.    Unfortunately she remains depressed.  No significant improvement with Vraylar 1.5 mg every daily added Trintellix 10 mg daily.  She is having side effects of insomnia and anxiety.  She is overstimulated.  Still feels dysphoric and fleeting suicidal thoughts. Discussed alternative antidepressant as well as the possibility of alternative mood stabilizer such as Latuda.   We will stop the Vraylar due to side effects but she needs a mood stabilizer of some sort.  Because of fast acting antianxiety effects of clonidine and it is used off label as a mood stabilizer this was discussed with her.  She asked for a benzo.  This is not a good long-term solution but will agree to do that short-term if she holds the Adderall.  DC DT agitation Vraylar to 1.5 mg daily Change Prozac to Trintellix 10 mg daily. Discussed side effects of each Hold Adderall to XR 20 mg AM Clonidine 0.1 1/2 tablet twice daily for 2 days and if needed for anxiety and sleep increase to 1 twice daily Ok temporary Ativan 1 mg 3 times daily as needed anxiety  Discussed potential metabolic side effects associated with atypical antipsychotics, as well as potential risk for movement side effects.  Advised pt to contact office if movement side effects occur. Answered her questions about Vraylar and its mood effects.  She was concerned it might possibly bring her down.  Discussed that it was just recently FDA approved for major depression.  Discussed potential benefits, risks, and side effects of stimulants with patient to include increased heart rate, palpitations, insomnia, increased anxiety, increased irritability, or decreased appetite.  Instructed patient to contact office if experiencing any significant tolerability issues. She wants to return to usual dose of Adderall for ADD bc of mor poor cognitive function with reduction.  Also discussed that depression will impair cognitive function.  Has Maintain sobriety  Recognizes effect of anemia on ADD and mood.  Had iron infusions last winter. She is losing weight and that needs to stop  FU next week call if there is any worsening of depression  Meredith Staggersarey Cottle, MD, DFAPA     Please see After Visit Summary for patient specific instructions.  No future appointments.     No orders of the defined types were placed in this encounter.      -------------------------------

## 2022-01-05 ENCOUNTER — Ambulatory Visit: Payer: 59 | Admitting: Psychiatry

## 2022-01-05 ENCOUNTER — Encounter: Payer: Self-pay | Admitting: Psychiatry

## 2022-01-05 ENCOUNTER — Other Ambulatory Visit: Payer: Self-pay

## 2022-01-05 VITALS — BP 113/74 | HR 113

## 2022-01-05 DIAGNOSIS — F900 Attention-deficit hyperactivity disorder, predominantly inattentive type: Secondary | ICD-10-CM | POA: Diagnosis not present

## 2022-01-05 DIAGNOSIS — F3130 Bipolar disorder, current episode depressed, mild or moderate severity, unspecified: Secondary | ICD-10-CM | POA: Diagnosis not present

## 2022-01-05 DIAGNOSIS — F5105 Insomnia due to other mental disorder: Secondary | ICD-10-CM

## 2022-01-05 DIAGNOSIS — F411 Generalized anxiety disorder: Secondary | ICD-10-CM | POA: Diagnosis not present

## 2022-01-05 MED ORDER — LURASIDONE HCL 40 MG PO TABS: ORAL_TABLET | ORAL | 0 refills | Status: DC

## 2022-01-05 NOTE — Progress Notes (Signed)
Laura Chang [id number] 07-03-1956 66 y.o.  Subjective:   Patient ID:  Laura Chang is a 66 y.o. (DOB October 10, 1956) female.  Chief Complaint:  Chief Complaint  Patient presents with   Follow-up    Bipolar I disorder, most recent episode depressed ([place])   Depression   Anxiety   ADD    HPI Laura Chang presents to the office today for follow-up of depression and anxiety and ADD.  seen November 12, 2019.  Melted down in 2020.  Went to [employer] in July.  No withdrawal.  1 drink since then.  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 She was on no stimulant and no SSRI but was taking Strattera and Wellbutrin.  The following changes were made. Stop Strattera. OK restart stimulant bc severe ADD Restart Adderall 1 daily for a few days and if tolerated then restart 1 twice daily. If not tolerated reduce the dosage if needed. May need to stop Wellbutrin if not tolerating the stimulant.  Yes.  DC Wellbutrin Restart Prozac 20 mg daily.  February 2021 appointment with the following noted: Completed grant proposal.  Couldn't doit without Adderall.  Sold a bunch of work.   Adderall XR lasts about 3 pm.  Strength seems about right.  BP been OK.  Not jittery.   Stopped Wellbutrin but had no SE. Mood drastically better with grant proposal and back on fluoxetine.  Less depressed and lethargic.  No anxiety.  Cut back on coffee. Started back with devotions and stronger faith. Plan: Continue Prozac 20 mg daily. May have to increase the dose at some point in the future given that she usually was taking higher dosages but she is getting good response at this time. Restart Wellbutrin off label for ADD since can't get 2 ADDERALL daily. 150 mg daily then 300 mg daily. She can adjust the dose between 150 mg and 300 mg daily to get the optimal effect.   05/11/2020 appointment with the following noted: Has been inconsistent with Prozac and Wellbutrin. Not sure of the  effect of Wellbutrin. Biggest deterrent in work is anxiety.  Some of the work is conceptual and difficult at times.  Can feel she's not up to a project at times.  Overall is OK but would like a steadier benefit from stimulant.  Exhausted from managing concentration and keeping up with things from the day.  Loses things.  Not good keeping up with schedule. Overall productive and emotionally OK. Can feel Adderall wear off. Mood is better in summer and worse in the winter.   F died in 10-01-2023 and that is a loss. No SE Wellbutrin. Still attends AA meetings.  Real benefit from [place] last year. Recognizes effect of anemia on ADD and mood.  Had iron infusions last winter. Plan:  Wellbutrin off label for ADD since can't get 2 ADDERALL daily. 150 mg daily then 300 mg daily.  01/24/2021 appointment with following noted: Doing a program called [employer] mindfulness app since Xmas.  CBT app helped the depression.  App helped her focus better.  Lost sign weight. Writing a lot. Before Xmas felt depressed and started negative thinking worse, self denigrating. Not drinking. More isolated.   Recognizes mo is narcissist.    Didn't tell anyone she was born until 3 mos later.  M aloof and uninterested in pt.  Lied about her birthday.  Mo lack of affection even with pt's kids. Going to [place] for a year and it  helped her to quit drinking. Also misses kids being gone with a hole also. Plan: No med changes  05/04/2021 appointment with the following noted: Therapist [name] thinks she's manic. Lost weight to 144#.   States she is still sleeping okay.  Admits she is hyper and recognizes that she is likely manic.  She feels great, euphoric with an increased sense of spiritual connectedness to God.  She has racing thoughts and talks fast and talks a lot and this is noted by her husband.  He thinks she is a bit hyper.  She has been able to maintain sobriety although she will have 1 glass of wine on special  occasions but does not drink by herself.  She is not drinking to excess.  She denies any dangerous impulsivity.  She is clearly not depressed and not particularly anxious.  She has no concerns about her medication and she has been compliant.  06/16/21 appt noted: So much better.  Going through a lot but the manic thing happened on top of it.  So much slower.  Didn't feel like losing anything with risperidone.  Likes the Adderalll at 10 mg. Some drowsiness in the AM and very drowsy from risperidone 2 mg HS. Prayer life is better. Handling stress better. Less depressed with risperidone. Still likes trazodone. Sleeps well. Plan: Reduce Prozac to 10 mg daily.  Consider stopping it because it can feel the mania however she is reluctant to do that because she fears relapse of depression. Reduce risperidone to 1.5 mg nightly due to side effects.  Discussed risk of worsening mania.  07/25/2021 appointment with the following noted: Misses the Adderall and hard to function without it. Depressed now. Heavy chest.  Anxious and guilty.  Body feels heavy.   Hates Wellbutrin.   Plan: Increase fluoxetine to 20 mg daily Add Abilify 1/2 of 15 mg tablet daily Wean wellbutrin by 1 tablet each week  bc she feels it is not helpful and DT polypharmacy Reduce risperidone to 1 daily for 1 week and stop it. Disc risk of mania. Increase Adderall to XR 20 mg AM  08/08/21 Much less depressed and starting to feel normal I feel a lot better. No SE.  Speech normal off risperidone. Sleeping OK on trazaodone and enough.   Noticed benefit from Adderall again. Plan: continue fluoxetine to 20 mg daily Continue Abilify 1/2 of 15 mg tablet daily for depression and mania continue Adderall to XR 20 mg AM  10/10/2021 phone call: Pt stated she feels like the Abilify should be decreased to 5mg .She said she is depressed but rational and not suicidal.She has an appt Monday and can wait until then if you prefer. MD response: Reduce the  Abilify to 7.5 mg every other day.  We will meet on 10/16/2021 and decide what to do from there.  10/16/2021 appointment with the following noted: More depressed.  Most depressed I've ever been.  Just numb.  Sense of grief.   Thinks the manic episode was unlike anything else she ever had.  Doesn't want to medicate against it.  Don't enjoy people.  Easily overwhelmed.  Had some death thoughts but not suicidal.  Has been functional.  Feels better today after reducing Abilify to every other day but she is only been doing that for 3 days. A/P: Episode of post manic depression was explained. continue fluoxetine to 20 mg daily Hold Abilify for 1 week then resume Abilify 1/2 of 15 mg tablet every other day for depression and  mania continue Adderall to XR 20 mg AM  10/27/2021 appointment with the following noted: I'm doing so much better.  Handling the depression better. Better self talk and spiritual focus has helped.   Dep 6/10 manifesting as anxiety with low confidence.   F died 2  years ago and M 66 yo and is dependent . She is working hard to feel better but still feels depressed.  She almost feels like she has a little more anxiety since restarting Abilify every other day. Plan: continue fluoxetine to 20 mg daily DC Abilify .  Vrayalar 1.5 mg QOD to try to get rid of depression ASAP. continue Adderall to XR 20 mg AM  11/10/2021 appointment with the following noted: Busy with Xmas and it was fun with family but then a big let down.  Did well with it.  Functioned well with it.  Working hard on things with depression.  Not shutting down. Not sure but feels better today but yesterday was hard.  Difficulty dealing with mother.  She won't do anything to help herself.  Yesterday with her all day.  Won't do PT and has isolated herself.    Lack of confidence.   No SE with Vraylar.  11/24/21 urgent appointment appt noted: More and more depressed.   So anxious and doesn't want to be alone but can do  so. No appetite. Hurts inside. Has had some fleeting suicidal thoughts but would not act on them.  Tolerating meds. Has been consistent with Vraylar 1.5 mg every other day, fluoxetine 20 mg daily Plan: Increase Vraylar to 1.5 mg daily Change Prozac to Trintellix 10 mg daily. Discussed side effects of each continue Adderall to XR 20 mg AM  12/27/2021 appointment with the following noted: Not OK.  I feel less depressed but feels bat shit. Not sleeping well.  Extremely anxious. Off and on sleep. 3-4 hours of sleep.   Still having daily SI.  But also become obvious has so much to do.  Overwhelmed by tasks.   Needs anxiety meds to just function. Not more motivated.  Walked yesterday.   Feels afraid like in trouble but not irritable or angry. DC DT agitation Vraylar to 1.5 mg daily Change Prozac to Trintellix 10 mg daily. Hold Adderall to XR 20 mg AM Clonidine 0.1 1/2 tablet twice daily for 2 days and if needed for anxiety and sleep increase to 1 twice daily Ok temporary Ativan 1 mg 3 times daily as needed anxiety  01/05/22 appt noted: Off fluoxetine and  Trintellix.  Only on Ativan, trazodone and Adderall XR 20 plus added clonidine 0.1 mg BID Didn't think she needed to start Trintellix. Not taking Ativan.   Didn't like herself last week. Feels some better today. Wonders if the manic sx Not agitated.  Anxiety kind of calmed down.  A lot to be anxious about situationally.  $ stress. Concerns about downers with meds. Can't access normal personality. ? Lethargy and inability to talk as sE.  D [place] most like her.  Past Psychiatric Medication Trials: fluoxetine, duloxetine, Viibryd, lamotrigine, Pristiq, sertraline, citalopram Adderall, Adderall XR, Vyvanse, Ritalin, Strattera low dose NR Lorazepam Trazodone Wellbutrin Depakote,  lamotrigine cog complaints Lithium remotely Abilify 7.5  Vraylar 1.5 mg daily agitation and insomnia  At visit November 12, 2019. We discussed Patient  developed an increasingly severe alcohol dependence problem since her last visit in January.  She went to [employer] and has had no alcohol since then except 1 day.  She never  abused stimulants but they took her off the stimulants at [employer].  Her ADD was markedly worse.  The Wellbutrin did not helped the ADD.   D history lamotrigine rash at 66 yo  Review of Systems:  Review of Systems  Constitutional:  Positive for fatigue. Negative for unexpected weight change.  Cardiovascular:  Negative for palpitations.  Musculoskeletal:  Positive for arthralgias, back pain and joint swelling.       SP hip surgery October 2020  Neurological:  Negative for tremors and weakness.  Psychiatric/Behavioral:  Positive for dysphoric mood, sleep disturbance and suicidal ideas. Negative for agitation, behavioral problems, confusion, decreased concentration, hallucinations and self-injury. The patient is nervous/anxious. The patient is not hyperactive.    Medications: I have reviewed the patient's current medications.  Current Outpatient Medications  Medication Sig Dispense Refill   amphetamine-dextroamphetamine (ADDERALL XR) 20 MG 24 hr capsule Take 1 capsule (20 mg total) by mouth every morning. 30 capsule 0   cloNIDine (CATAPRES) 0.1 MG tablet Take 1 tablet (0.1 mg total) by mouth 2 (two) times daily. 60 tablet 0   iron polysaccharides (NIFEREX) 150 MG capsule TAKE 1 CAPSULE BY MOUTH EVERY DAY 30 capsule 1   LORazepam (ATIVAN) 1 MG tablet Take 1 tablet (1 mg total) by mouth every 8 (eight) hours. 30 tablet 0   lurasidone (LATUDA) 40 MG TABS tablet 1/2 tablet daily for 1 week then 1 daily 30 tablet 0   traZODone (DESYREL) 50 MG tablet TAKE 1 TABLET BY MOUTH EVERY DAY AT BEDTIME AS NEEDED FOR SLEEP 90 tablet 1   FLUoxetine (PROZAC) 20 MG capsule TAKE 1 CAPSULE BY MOUTH EVERY DAY (Patient not taking: Reported on 12/27/2021) 90 capsule 0   vortioxetine HBr (TRINTELLIX) 10 MG TABS tablet Take 10 mg by  mouth daily. (Patient not taking: Reported on 01/05/2022)     No current facility-administered medications for this visit.    Medication Side Effects: None  Allergies:  Allergies  Allergen Reactions   Metronidazole Shortness Of Breath and Other (See Comments)    Heart pounding   Ferrlecit [Na Ferric Gluc Cplx In Sucrose] Other (See Comments)    Infusion reaction 05/12/2019    Past Medical History:  Diagnosis Date   ADHD    Anemia    Anxiety    Arthritis    Depression    Heart murmur    i went to see a cardiologit slast eyar  and i had zero plaque,    PONV (postoperative nausea and vomiting)    Recovering alcoholic in remission ([place])     Family History  Problem Relation Age of Onset   Atrial fibrillation Mother    CAD Father     Social History   Socioeconomic History   Marital status: Married    Spouse name: Not on file   Number of children: Not on file   Years of education: Not on file   Highest education level: Not on file  Occupational History   Not on file  Tobacco Use   Smoking status: Former    Types: Cigarettes    Quit date: 08/16/2003    Years since quitting: 18.4   Smokeless tobacco: Never   Tobacco comments:     08-28-2019 "i smoked 2 cigarettes in the last month since my father  passed"  Vaping Use   Vaping Use: Never used  Substance and Sexual Activity   Alcohol use: Yes    Alcohol/week: 10.0 standard drinks    Types: 10  Glasses of wine per week   Drug use: No   Sexual activity: Not on file  Other Topics Concern   Not on file  Social History Narrative   Not on file   Social Determinants of Health   Financial Resource Strain: Not on file  Food Insecurity: Not on file  Transportation Needs: Not on file  Physical Activity: Not on file  Stress: Not on file  Social Connections: Not on file  Intimate Partner Violence: Not on file    Past Medical History, Surgical history, Social history, and Family history were reviewed and updated as  appropriate.   Please see review of systems for further details on the patient's review from today.   Objective:   Physical Exam:  BP 113/74    Pulse (!) 113   Physical Exam Constitutional:      General: She is not in acute distress. Musculoskeletal:        General: No deformity.  Neurological:     Mental Status: She is alert and oriented to person, place, and time.     Coordination: Coordination normal.     Gait: Gait normal.  Psychiatric:        Attention and Perception: Perception normal. She is attentive.        Mood and Affect: Mood is anxious and depressed. Affect is not labile, angry, tearful or inappropriate.        Speech: Speech is not rapid and pressured, slurred or tangential.        Behavior: Behavior is not agitated, slowed or aggressive.        Thought Content: Thought content is not paranoid. Thought content includes suicidal ideation. Thought content does not include homicidal ideation. Thought content does not include suicidal plan.        Cognition and Memory: Cognition normal.        Judgment: Judgment normal.     Comments: Insight intact. No auditory or visual hallucinations. No delusions.  Depression i ongoing. Anxiety better than last visit    Lab Review:     Component Value Date/Time   NA 137 01/12/2021 1430   NA 140 11/18/2018 1544   K 3.8 01/12/2021 1430   CL 108 01/12/2021 1430   CO2 22 01/12/2021 1430   GLUCOSE 94 01/12/2021 1430   BUN 14 01/12/2021 1430   BUN 20 11/18/2018 1544   CREATININE 0.82 01/12/2021 1430   CALCIUM 8.9 01/12/2021 1430   PROT 6.6 01/12/2021 1430   ALBUMIN 3.9 01/12/2021 1430   AST 12 (L) 01/12/2021 1430   ALT 11 01/12/2021 1430   ALKPHOS 46 01/12/2021 1430   BILITOT 0.5 01/12/2021 1430   GFRNONAA >60 01/12/2021 1430   GFRAA >60 09/02/2019 0249   GFRAA >60 01/27/2019 0811       Component Value Date/Time   WBC 4.5 01/12/2021 1430   RBC 4.32 01/12/2021 1430   HGB 12.8 01/12/2021 1430   HGB 12.9 07/17/2019  0953   HCT 38.5 01/12/2021 1430   HCT 21.9 (L) 12/25/2018 1221   PLT 272 01/12/2021 1430   PLT 286 07/17/2019 0953   MCV 89.1 01/12/2021 1430   MCH 29.6 01/12/2021 1430   MCHC 33.2 01/12/2021 1430   RDW 12.4 01/12/2021 1430   LYMPHSABS 1.4 01/12/2021 1430   MONOABS 0.4 01/12/2021 1430   EOSABS 0.0 01/12/2021 1430   BASOSABS 0.0 01/12/2021 1430    No results found for: POCLITH, LITHIUM   No results found for: PHENYTOIN, PHENOBARB, VALPROATE, CBMZ   .  res Assessment: Plan:    Bipolar I disorder, most recent episode depressed ([place])  Generalized anxiety disorder  Attention deficit hyperactivity disorder (ADHD), predominantly inattentive type  Insomnia due to mental condition   Greater than 50% of 30 min face to face time with patient was spent on counseling and coordination of care. We discussed how she was recently referred by her therapist because her therapist recognized mania.  Psychiatrist also verify the diagnosis.  The patient admitted recent mania which has resolved. Her mania was treated  and she has swung into a post-manic depression which initially resolved with Abilify and med changes.   However she has developed a post manic depression.  This was explained in detail.    We discussed the pros and cons of how to approach this bipolar depression.  She would rather not be treated for bipolar disorder than to feel this way.  Post manic depression was explained in detail.  She is expected to fully recover.  However we would be unwise and against standard medical practice to treat her with a antidepressant without a mood stabilizer.  .   This was explained again in detail and specifically that mood stabilizer are not nor intended to be "downers".  Some misattribution of depressive sx as SE of meds.  Unfortunately she remains depressed.  No significant improvement with Vraylar 1.5 mg every daily added Trintellix 10 mg daily.   Discussed alternative antidepressant as well as the  possibility of alternative mood stabilizer such as Latuda.     Because of fast acting antianxiety effects of clonidine and it is used off label as a mood stabilizer and for anxietythis was discussed with her.   Latuda 20-40 mg daily with food. Adderall to XR 20 mg AM Clonidine 0.1 1/2 tablet twice daily  reduce dose to be sure no SE Ok temporary Ativan 1 mg 3 times daily as needed anxiety  Discussed potential metabolic side effects associated with atypical antipsychotics, as well as potential risk for movement side effects. Advised pt to contact office if movement side effects occur. Answered her questions about Vraylar and its mood effects.  She was concerned it might possibly bring her down.  Discussed that it was just recently FDA approved for major depression.  Discussed potential benefits, risks, and side effects of stimulants with patient to include increased heart rate, palpitations, insomnia, increased anxiety, increased irritability, or decreased appetite.  Instructed patient to contact office if experiencing any significant tolerability issues. She wants to return to usual dose of Adderall for ADD bc of mor poor cognitive function with reduction.  Also discussed that depression will impair cognitive function.  Has Maintained sobriety   FU 2 week call if there is any worsening of depression  Lynder Parents, MD, DFAPA     Please see After Visit Summary for patient specific instructions.  Future Appointments  Date Time Provider [place]  01/19/2022 10:00 AM Cottle, Billey Co., MD [facility] None  01/30/2022  9:30 AM Cottle, Billey Co., MD [facility] None       No orders of the defined types were placed in this encounter.      -------------------------------

## 2022-01-05 NOTE — Patient Instructions (Signed)
1/2 Latuda for 1 week, then 1 daily with meal Clonidine 1/2 or 1 tablet twice daily adjusted for anxiety

## 2022-01-12 ENCOUNTER — Other Ambulatory Visit: Payer: Self-pay

## 2022-01-12 DIAGNOSIS — F3130 Bipolar disorder, current episode depressed, mild or moderate severity, unspecified: Secondary | ICD-10-CM

## 2022-01-12 DIAGNOSIS — F411 Generalized anxiety disorder: Secondary | ICD-10-CM

## 2022-01-12 DIAGNOSIS — F5105 Insomnia due to other mental disorder: Secondary | ICD-10-CM

## 2022-01-12 MED ORDER — LORAZEPAM 1 MG PO TABS: 1.0000 mg | ORAL_TABLET | Freq: Three times a day (TID) | ORAL | 0 refills | Status: DC

## 2022-01-15 ENCOUNTER — Other Ambulatory Visit: Payer: Self-pay | Admitting: Hematology

## 2022-01-18 ENCOUNTER — Other Ambulatory Visit: Payer: Self-pay | Admitting: Psychiatry

## 2022-01-18 DIAGNOSIS — F411 Generalized anxiety disorder: Secondary | ICD-10-CM

## 2022-01-18 DIAGNOSIS — F5105 Insomnia due to other mental disorder: Secondary | ICD-10-CM

## 2022-01-18 DIAGNOSIS — F3130 Bipolar disorder, current episode depressed, mild or moderate severity, unspecified: Secondary | ICD-10-CM

## 2022-01-19 ENCOUNTER — Ambulatory Visit (INDEPENDENT_AMBULATORY_CARE_PROVIDER_SITE_OTHER): Payer: 59 | Admitting: Psychiatry

## 2022-01-19 ENCOUNTER — Other Ambulatory Visit: Payer: Self-pay

## 2022-01-19 ENCOUNTER — Encounter: Payer: Self-pay | Admitting: Psychiatry

## 2022-01-19 VITALS — BP 146/99 | HR 70

## 2022-01-19 DIAGNOSIS — F411 Generalized anxiety disorder: Secondary | ICD-10-CM

## 2022-01-19 DIAGNOSIS — F3130 Bipolar disorder, current episode depressed, mild or moderate severity, unspecified: Secondary | ICD-10-CM

## 2022-01-19 DIAGNOSIS — F5105 Insomnia due to other mental disorder: Secondary | ICD-10-CM

## 2022-01-19 DIAGNOSIS — F900 Attention-deficit hyperactivity disorder, predominantly inattentive type: Secondary | ICD-10-CM

## 2022-01-19 MED ORDER — AMPHETAMINE-DEXTROAMPHET ER 20 MG PO CP24: 20.0000 mg | ORAL_CAPSULE | ORAL | 0 refills | Status: DC

## 2022-01-19 MED ORDER — FLUOXETINE HCL 20 MG PO CAPS: 20.0000 mg | ORAL_CAPSULE | Freq: Every day | ORAL | 0 refills | Status: DC

## 2022-01-19 NOTE — Progress Notes (Signed)
Laura Chang [id number] Dec 12, 1955 66 y.o.  Subjective:   Patient ID:  Laura Chang is a 66 y.o. (DOB 18-Mar-1956) female.  Chief Complaint:  Chief Complaint  Patient presents with   Follow-up    Bipolar I disorder, most recent episode depressed ([place])   Depression   ADD    HPI Laura Chang presents to the office today for follow-up of depression and anxiety and ADD.  seen November 12, 2019.  Melted down in 2020.  Went to [employer] in July.  No withdrawal.  1 drink since then.  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 She was on no stimulant and no SSRI but was taking Strattera and Wellbutrin.  The following changes were made. Stop Strattera. OK restart stimulant bc severe ADD Restart Adderall 1 daily for a few days and if tolerated then restart 1 twice daily. If not tolerated reduce the dosage if needed. May need to stop Wellbutrin if not tolerating the stimulant.  Yes.  DC Wellbutrin Restart Prozac 20 mg daily.  February 2021 appointment with the following noted: Completed grant proposal.  Couldn't doit without Adderall.  Sold a bunch of work.   Adderall XR lasts about 3 pm.  Strength seems about right.  BP been OK.  Not jittery.   Stopped Wellbutrin but had no SE. Mood drastically better with grant proposal and back on fluoxetine.  Less depressed and lethargic.  No anxiety.  Cut back on coffee. Started back with devotions and stronger faith. Plan: Continue Prozac 20 mg daily. May have to increase the dose at some point in the future given that she usually was taking higher dosages but she is getting good response at this time. Restart Wellbutrin off label for ADD since can't get 2 ADDERALL daily. 150 mg daily then 300 mg daily. She can adjust the dose between 150 mg and 300 mg daily to get the optimal effect.   05/11/2020 appointment with the following noted: Has been inconsistent with Prozac and Wellbutrin. Not sure of the effect of  Wellbutrin. Biggest deterrent in work is anxiety.  Some of the work is conceptual and difficult at times.  Can feel she's not up to a project at times.  Overall is OK but would like a steadier benefit from stimulant.  Exhausted from managing concentration and keeping up with things from the day.  Loses things.  Not good keeping up with schedule. Overall productive and emotionally OK. Can feel Adderall wear off. Mood is better in summer and worse in the winter.   F died in 2023/10/03 and that is a loss. No SE Wellbutrin. Still attends AA meetings.  Real benefit from [place] last year. Recognizes effect of anemia on ADD and mood.  Had iron infusions last winter. Plan:  Wellbutrin off label for ADD since can't get 2 ADDERALL daily. 150 mg daily then 300 mg daily.  01/24/2021 appointment with following noted: Doing a program called [employer] mindfulness app since Xmas.  CBT app helped the depression.  App helped her focus better.  Lost sign weight. Writing a lot. Before Xmas felt depressed and started negative thinking worse, self denigrating. Not drinking. More isolated.   Recognizes mo is narcissist.    Didn't tell anyone she was born until 3 mos later.  M aloof and uninterested in pt.  Lied about her birthday.  Mo lack of affection even with pt's kids. Going to [place] for a year and it helped her to  quit drinking. Also misses kids being gone with a hole also. Plan: No med changes  05/04/2021 appointment with the following noted: Therapist [name] thinks she's manic. Lost weight to 144#.   States she is still sleeping okay.  Admits she is hyper and recognizes that she is likely manic.  She feels great, euphoric with an increased sense of spiritual connectedness to God.  She has racing thoughts and talks fast and talks a lot and this is noted by her husband.  He thinks she is a bit hyper.  She has been able to maintain sobriety although she will have 1 glass of wine on special occasions but  does not drink by herself.  She is not drinking to excess.  She denies any dangerous impulsivity.  She is clearly not depressed and not particularly anxious.  She has no concerns about her medication and she has been compliant.  06/16/21 appt noted: So much better.  Going through a lot but the manic thing happened on top of it.  So much slower.  Didn't feel like losing anything with risperidone.  Likes the Adderalll at 10 mg. Some drowsiness in the AM and very drowsy from risperidone 2 mg HS. Prayer life is better. Handling stress better. Less depressed with risperidone. Still likes trazodone. Sleeps well. Plan: Reduce Prozac to 10 mg daily.  Consider stopping it because it can feel the mania however she is reluctant to do that because she fears relapse of depression. Reduce risperidone to 1.5 mg nightly due to side effects.  Discussed risk of worsening mania.  07/25/2021 appointment with the following noted: Misses the Adderall and hard to function without it. Depressed now. Heavy chest.  Anxious and guilty.  Body feels heavy.   Hates Wellbutrin.   Plan: Increase fluoxetine to 20 mg daily Add Abilify 1/2 of 15 mg tablet daily Wean wellbutrin by 1 tablet each week  bc she feels it is not helpful and DT polypharmacy Reduce risperidone to 1 daily for 1 week and stop it. Disc risk of mania. Increase Adderall to XR 20 mg AM  08/08/21 Much less depressed and starting to feel normal I feel a lot better. No SE.  Speech normal off risperidone. Sleeping OK on trazaodone and enough.   Noticed benefit from Adderall again. Plan: continue fluoxetine to 20 mg daily Continue Abilify 1/2 of 15 mg tablet daily for depression and mania continue Adderall to XR 20 mg AM  10/10/2021 phone call: Pt stated she feels like the Abilify should be decreased to 5mg .She said she is depressed but rational and not suicidal.She has an appt Monday and can wait until then if you prefer. MD response: Reduce the Abilify to  7.5 mg every other day.  We will meet on 10/16/2021 and decide what to do from there.  10/16/2021 appointment with the following noted: More depressed.  Most depressed I've ever been.  Just numb.  Sense of grief.   Thinks the manic episode was unlike anything else she ever had.  Doesn't want to medicate against it.  Don't enjoy people.  Easily overwhelmed.  Had some death thoughts but not suicidal.  Has been functional.  Feels better today after reducing Abilify to every other day but she is only been doing that for 3 days. A/P: Episode of post manic depression was explained. continue fluoxetine to 20 mg daily Hold Abilify for 1 week then resume Abilify 1/2 of 15 mg tablet every other day for depression and mania continue Adderall  to XR 20 mg AM  10/27/2021 appointment with the following noted: I'm doing so much better.  Handling the depression better. Better self talk and spiritual focus has helped.   Dep 6/10 manifesting as anxiety with low confidence.   F died 2  years ago and M 66 yo and is dependent . She is working hard to feel better but still feels depressed.  She almost feels like she has a little more anxiety since restarting Abilify every other day. Plan: continue fluoxetine to 20 mg daily DC Abilify .  Vrayalar 1.5 mg QOD to try to get rid of depression ASAP. continue Adderall to XR 20 mg AM  11/10/2021 appointment with the following noted: Busy with Xmas and it was fun with family but then a big let down.  Did well with it.  Functioned well with it.  Working hard on things with depression.  Not shutting down. Not sure but feels better today but yesterday was hard.  Difficulty dealing with mother.  She won't do anything to help herself.  Yesterday with her all day.  Won't do PT and has isolated herself.    Lack of confidence.   No SE with Vraylar.  11/24/21 urgent appointment appt noted: More and more depressed.   So anxious and doesn't want to be alone but can do so. No  appetite. Hurts inside. Has had some fleeting suicidal thoughts but would not act on them.  Tolerating meds. Has been consistent with Vraylar 1.5 mg every other day, fluoxetine 20 mg daily Plan: Increase Vraylar to 1.5 mg daily Change Prozac to Trintellix 10 mg daily. Discussed side effects of each continue Adderall to XR 20 mg AM  12/27/2021 appointment with the following noted: Not OK.  I feel less depressed but feels bat shit. Not sleeping well.  Extremely anxious. Off and on sleep. 3-4 hours of sleep.   Still having daily SI.  But also become obvious has so much to do.  Overwhelmed by tasks.   Needs anxiety meds to just function. Not more motivated.  Walked yesterday.   Feels afraid like in trouble but not irritable or angry. DC DT agitation Vraylar to 1.5 mg daily Change Prozac to Trintellix 10 mg daily. Hold Adderall to XR 20 mg AM Clonidine 0.1 1/2 tablet twice daily for 2 days and if needed for anxiety and sleep increase to 1 twice daily Ok temporary Ativan 1 mg 3 times daily as needed anxiety  01/05/22 appt noted: Off fluoxetine and  Trintellix.  Only on Ativan, trazodone and Adderall XR 20 plus added clonidine 0.1 mg BID Didn't think she needed to start Trintellix. Not taking Ativan.   Didn't like herself last week. Feels some better today. Wonders if the manic sx Not agitated.  Anxiety kind of calmed down.  A lot to be anxious about situationally.  $ stress. Concerns about downers with meds. Can't access normal personality. ? Lethargy and inability to talk as sE. Plan: Latuda 20-40 mg daily with food. Adderall to XR 20 mg AM Clonidine 0.1 1/2 tablet twice daily  reduce dose to be sure no SE Ok temporary Ativan 1 mg 3 times daily as needed anxiety  01/19/22 appt noted: Taking Latuda 20 mg daily.  Took 40 mg once and felt anxious and  SI Still depressed and not very reactive Anxiety mainly about the depression and fears of the future. She wants to revisit manic sx and  thinks it was maybe bc taking delta 8 bc was  taking a lot of it so still doesn't think she's classic bipolar. She wants to only take Prozac bc thinks Latuda is perpetuating depression. Says the delta 8 was very psychaedelic.  When not taking it was not manic.  Sleeping ok again.   D [place] most like her.  Past Psychiatric Medication Trials: fluoxetine, duloxetine, Viibryd, lamotrigine, Pristiq, sertraline, citalopram,  Trintellix anxious and SI Adderall, Adderall XR, Vyvanse, Ritalin, Strattera low dose NR Lorazepam Trazodone Wellbutrin Depakote,  lamotrigine cog complaints Lithium remotely Abilify 7.5  Vraylar 1.5 mg daily agitation and insomnia  At visit November 12, 2019. We discussed Patient developed an increasingly severe alcohol dependence problem since her last visit in January.  She went to [employer] and has had no alcohol since then except 1 day.  She never abused stimulants but they took her off the stimulants at [employer].  Her ADD was markedly worse.  The Wellbutrin did not helped the ADD.   D history lamotrigine rash at 66 yo  Review of Systems:  Review of Systems  Constitutional:  Positive for fatigue. Negative for unexpected weight change.  Cardiovascular:  Negative for palpitations.  Musculoskeletal:  Positive for arthralgias, back pain and joint swelling.       SP hip surgery October 2020  Neurological:  Negative for tremors and weakness.  Psychiatric/Behavioral:  Positive for dysphoric mood, sleep disturbance and suicidal ideas. Negative for agitation, behavioral problems, confusion, decreased concentration, hallucinations and self-injury. The patient is nervous/anxious. The patient is not hyperactive.    Medications: I have reviewed the patient's current medications.  Current Outpatient Medications  Medication Sig Dispense Refill   cloNIDine (CATAPRES) 0.1 MG tablet TAKE 1 TABLET BY MOUTH 2 TIMES DAILY. (Patient taking differently: PRN) 60 tablet 0    iron polysaccharides (NIFEREX) 150 MG capsule TAKE 1 CAPSULE BY MOUTH EVERY DAY 30 capsule 1   LORazepam (ATIVAN) 1 MG tablet Take 1 tablet (1 mg total) by mouth every 8 (eight) hours. 30 tablet 0   traZODone (DESYREL) 50 MG tablet TAKE 1 TABLET BY MOUTH EVERY DAY AT BEDTIME AS NEEDED FOR SLEEP 90 tablet 1   amphetamine-dextroamphetamine (ADDERALL XR) 20 MG 24 hr capsule Take 1 capsule (20 mg total) by mouth every morning. 30 capsule 0   FLUoxetine (PROZAC) 20 MG capsule Take 1 capsule (20 mg total) by mouth daily. 90 capsule 0   No current facility-administered medications for this visit.    Medication Side Effects: None  Allergies:  Allergies  Allergen Reactions   Metronidazole Shortness Of Breath and Other (See Comments)    Heart pounding   Ferrlecit [Na Ferric Gluc Cplx In Sucrose] Other (See Comments)    Infusion reaction 05/12/2019    Past Medical History:  Diagnosis Date   ADHD    Anemia    Anxiety    Arthritis    Depression    Heart murmur    i went to see a cardiologit slast eyar  and i had zero plaque,    PONV (postoperative nausea and vomiting)    Recovering alcoholic in remission ([place])     Family History  Problem Relation Age of Onset   Atrial fibrillation Mother    CAD Father     Social History   Socioeconomic History   Marital status: Married    Spouse name: Not on file   Number of children: Not on file   Years of education: Not on file   Highest education level: Not on file  Occupational  History   Not on file  Tobacco Use   Smoking status: Former    Types: Cigarettes    Quit date: 08/16/2003    Years since quitting: 18.4   Smokeless tobacco: Never   Tobacco comments:     08-28-2019 "i smoked 2 cigarettes in the last month since my father  passed"  Vaping Use   Vaping Use: Never used  Substance and Sexual Activity   Alcohol use: Yes    Alcohol/week: 10.0 standard drinks    Types: 10 Glasses of wine per week   Drug use: No   Sexual  activity: Not on file  Other Topics Concern   Not on file  Social History Narrative   Not on file   Social Determinants of Health   Financial Resource Strain: Not on file  Food Insecurity: Not on file  Transportation Needs: Not on file  Physical Activity: Not on file  Stress: Not on file  Social Connections: Not on file  Intimate Partner Violence: Not on file    Past Medical History, Surgical history, Social history, and Family history were reviewed and updated as appropriate.   Please see review of systems for further details on the patient's review from today.   Objective:   Physical Exam:  BP (!) 146/99    Pulse 70   Physical Exam Constitutional:      General: She is not in acute distress. Musculoskeletal:        General: No deformity.  Neurological:     Mental Status: She is alert and oriented to person, place, and time.     Coordination: Coordination normal.     Gait: Gait normal.  Psychiatric:        Attention and Perception: Perception normal. She is attentive.        Mood and Affect: Mood is anxious and depressed. Affect is not labile, angry, tearful or inappropriate.        Speech: Speech is not rapid and pressured, slurred or tangential.        Behavior: Behavior is not agitated, slowed or aggressive.        Thought Content: Thought content is not paranoid. Thought content includes suicidal ideation. Thought content does not include homicidal ideation. Thought content does not include suicidal plan.        Cognition and Memory: Cognition normal.        Judgment: Judgment normal.     Comments: Insight intact. No auditory or visual hallucinations. No delusions.  Depression i ongoing. Anxiety better than last visit    Lab Review:     Component Value Date/Time   NA 137 01/12/2021 1430   NA 140 11/18/2018 1544   K 3.8 01/12/2021 1430   CL 108 01/12/2021 1430   CO2 22 01/12/2021 1430   GLUCOSE 94 01/12/2021 1430   BUN 14 01/12/2021 1430   BUN 20  11/18/2018 1544   CREATININE 0.82 01/12/2021 1430   CALCIUM 8.9 01/12/2021 1430   PROT 6.6 01/12/2021 1430   ALBUMIN 3.9 01/12/2021 1430   AST 12 (L) 01/12/2021 1430   ALT 11 01/12/2021 1430   ALKPHOS 46 01/12/2021 1430   BILITOT 0.5 01/12/2021 1430   GFRNONAA >60 01/12/2021 1430   GFRAA >60 09/02/2019 0249   GFRAA >60 01/27/2019 0811       Component Value Date/Time   WBC 4.5 01/12/2021 1430   RBC 4.32 01/12/2021 1430   HGB 12.8 01/12/2021 1430   HGB 12.9 07/17/2019 0953  HCT 38.5 01/12/2021 1430   HCT 21.9 (L) 12/25/2018 1221   PLT 272 01/12/2021 1430   PLT 286 07/17/2019 0953   MCV 89.1 01/12/2021 1430   MCH 29.6 01/12/2021 1430   MCHC 33.2 01/12/2021 1430   RDW 12.4 01/12/2021 1430   LYMPHSABS 1.4 01/12/2021 1430   MONOABS 0.4 01/12/2021 1430   EOSABS 0.0 01/12/2021 1430   BASOSABS 0.0 01/12/2021 1430    No results found for: POCLITH, LITHIUM   No results found for: PHENYTOIN, PHENOBARB, VALPROATE, CBMZ   .res Assessment: Plan:    Bipolar I disorder, most recent episode depressed ([place]) - Plan: FLUoxetine (PROZAC) 20 MG capsule  Generalized anxiety disorder  Attention deficit hyperactivity disorder (ADHD), predominantly inattentive type - Plan: amphetamine-dextroamphetamine (ADDERALL XR) 20 MG 24 hr capsule  Insomnia due to mental condition   Greater than 50% of 30 min face to face time with patient was spent on counseling and coordination of care. We discussed how she was recently referred by her therapist because her therapist recognized mania.  Psychiatrist also verify the diagnosis.  The patient admitted recent mania which has resolved. Her mania was treated  and she has swung into a post-manic depression which initially resolved with Abilify and med changes.   However she has developed a post manic depression.  This was explained in detail.    We discussed the pros and cons of how to approach this bipolar depression.  She would rather not be treated for  bipolar disorder than to feel this way.  Post manic depression was explained in detail.  She is expected to fully recover.  However we would be unwise and against standard medical practice to treat her with a antidepressant without a mood stabilizer.  .   This was explained again in detail and specifically that mood stabilizer are not nor intended to be "downers".  Some misattribution of depressive sx as SE of meds.  Unfortunately she remains depressed.  Discussed alternatives.   Because of fast acting antianxiety effects of clonidine and it is used off label as a mood stabilizer and for anxietythis was discussed with her.   Per her request DC Latuda 20-40 mg daily with food. She wants to continue Prozac alone AMA  Adderall to XR 20 mg AM Clonidine 0.1 1/2 tablet twice daily  reduce dose to be sure no SE Ok temporary Ativan 1 mg 3 times daily as needed anxiety  Discussed potential metabolic side effects associated with atypical antipsychotics, as well as potential risk for movement side effects. Advised pt to contact office if movement side effects occur. Answered her questions about Vraylar and its mood effects.  She was concerned it might possibly bring her down.  Discussed that it was just recently FDA approved for major depression.  Discussed potential benefits, risks, and side effects of stimulants with patient to include increased heart rate, palpitations, insomnia, increased anxiety, increased irritability, or decreased appetite.  Instructed patient to contact office if experiencing any significant tolerability issues. She wants to return to usual dose of Adderall for ADD bc of mor poor cognitive function with reduction.  Also discussed that depression will impair cognitive function.  Has Maintained sobriety  FU 2 week call if there is any worsening of depression  Lynder Parents, MD, DFAPA     Please see After Visit Summary for patient specific instructions.  Future Appointments   Date Time Provider [place]  01/30/2022  9:30 AM Cottle, Billey Co., MD [facility] None  No orders of the defined types were placed in this encounter.      -------------------------------

## 2022-01-23 ENCOUNTER — Telehealth: Payer: Self-pay | Admitting: Psychiatry

## 2022-01-23 NOTE — Telephone Encounter (Signed)
Pt stated since stopping latuda she has became more anxious.She has been taking the ativan twice a day but you only sent her #30.She gets really nervous when she see's they are running out and wants to know if she can get more sent in

## 2022-01-23 NOTE — Telephone Encounter (Signed)
Pt lvm at 4:39 pm and said that she made a med switch and she is extremley anxious. She said she would like to increase the dose or go back on lorazapam. Please give her a call at [phone] ?

## 2022-01-24 NOTE — Telephone Encounter (Signed)
Because she is taking the stimulant she cannot stay on lorazepam.  We will going to have to come up with some other plan to treat the anxiety.  I have only go to give her short-term supplies of lorazepam because I do not want her to develop dependence and also because I do not want her using it regularly with Adderall.  Has she tried using the clonidine?  If she is tolerating a half twice a day but it does not help then increase the dosage to 3:30 times a day or 1 twice a day ?

## 2022-01-24 NOTE — Telephone Encounter (Signed)
She does take clonidine.She will try increasing  ?

## 2022-01-26 ENCOUNTER — Telehealth: Payer: Self-pay | Admitting: Psychiatry

## 2022-01-26 NOTE — Telephone Encounter (Signed)
On 3/14 you told her to increase clonidine because her anxiety had increased since stopping latuda.She only increased it as of yesterday but stated she did not notice a difference.She still has vraylar if you want her to take it again.She said she's not getting any better.

## 2022-01-26 NOTE — Telephone Encounter (Signed)
Laura Chang called and said that she stopped taking a medication (she does not know the name) and states that her anxiety is awful now. She says "it doesn't make her feel better but makes her feel worse". She has taken the Vraylar in the past and would like to try it again. She states that she doesn't think she took the Vraylar long enough to get in her system. Her phone number is [phone]. ?

## 2022-01-26 NOTE — Telephone Encounter (Signed)
Pt informed and samples pulled

## 2022-01-26 NOTE — Telephone Encounter (Signed)
My notes say that the Vraylar made her feel too agitated and amped up.  I do not want to do that.  Part of the problem could be the interaction between the Vraylar and the Prozac.  Have her come pick up samples of Rexulti 1 mg and start that as one daily.  Tell her it is very similar to Vraylar but has a better antianxiety effect and lower overall side effect risk.  It is FDA approved for major depression ?

## 2022-01-30 ENCOUNTER — Telehealth: Payer: Self-pay | Admitting: Psychiatry

## 2022-01-30 ENCOUNTER — Ambulatory Visit: Payer: 59 | Admitting: Psychiatry

## 2022-01-30 NOTE — Telephone Encounter (Signed)
Most people will see benefit within about a week.  She is does not see improvement by Friday or Monday than give [place] a call. ?

## 2022-01-30 NOTE — Telephone Encounter (Signed)
Pt wants to know how long it will take rexulti to work

## 2022-01-30 NOTE — Telephone Encounter (Signed)
Patient lvm today at 1:45 inquiring when will the new medication start to work. Refer to telephone encounter on 3/17. # [phone]. ?

## 2022-01-30 NOTE — Telephone Encounter (Signed)
Pt informed

## 2022-02-06 ENCOUNTER — Telehealth: Payer: Self-pay | Admitting: Psychiatry

## 2022-02-06 ENCOUNTER — Other Ambulatory Visit: Payer: Self-pay | Admitting: Psychiatry

## 2022-02-06 DIAGNOSIS — F5105 Insomnia due to other mental disorder: Secondary | ICD-10-CM

## 2022-02-06 MED ORDER — TRAZODONE HCL 50 MG PO TABS: ORAL_TABLET | ORAL | 0 refills | Status: DC

## 2022-02-06 NOTE — Telephone Encounter (Signed)
Patient lvm @ 11:35 stating that she has been taking a new medication for about a week now." Can feel it has kicked in, but it has been making her feel extremely anxious. She wondered if she should keep taking it. Also having obsessive thoughts again." Pls rtc [phone] ?

## 2022-02-06 NOTE — Telephone Encounter (Signed)
I sent RX for trazodone 50-100 mg HS so she can increase it.  Often increasing the Rexulti helps anxiety more but the blood level will keep going up without dose change for another couple of weeks.  So wait another week and if anxiety is not improving then increase Rexulti to 2 mg daily which is the normal dosage. ?

## 2022-02-06 NOTE — Telephone Encounter (Signed)
Called patient and she said since starting the [place] she feels like her depression is better, but she is not sleeping as well and she is obsessing over things, blowing them way out of proportion. She denies SI. She isn't sleeping well, though even prior to starting Rexulti she was not sleeping well. She got a 90-day supply of trazodone 1/31 and she is out of medication. She does have a RF available, but pharmacy won't fill until tomorrow. Patient is currently taking 1.5 tablets of trazodone and is asking for a RF of this dose so she can sleep tonight. She does have an appt with you on Friday.  ?

## 2022-02-07 ENCOUNTER — Other Ambulatory Visit: Payer: Self-pay | Admitting: Hematology

## 2022-02-07 NOTE — Telephone Encounter (Signed)
Patient given the recommendations and understands.  ?

## 2022-02-09 ENCOUNTER — Ambulatory Visit (INDEPENDENT_AMBULATORY_CARE_PROVIDER_SITE_OTHER): Payer: 59 | Admitting: Psychiatry

## 2022-02-09 ENCOUNTER — Encounter: Payer: Self-pay | Admitting: Psychiatry

## 2022-02-09 DIAGNOSIS — F3130 Bipolar disorder, current episode depressed, mild or moderate severity, unspecified: Secondary | ICD-10-CM

## 2022-02-09 DIAGNOSIS — F411 Generalized anxiety disorder: Secondary | ICD-10-CM

## 2022-02-09 DIAGNOSIS — F5105 Insomnia due to other mental disorder: Secondary | ICD-10-CM | POA: Diagnosis not present

## 2022-02-09 MED ORDER — QUETIAPINE FUMARATE ER 150 MG PO TB24: 150.0000 mg | ORAL_TABLET | Freq: Every day | ORAL | 0 refills | Status: DC

## 2022-02-09 MED ORDER — LORAZEPAM 1 MG PO TABS: 1.0000 mg | ORAL_TABLET | Freq: Three times a day (TID) | ORAL | 0 refills | Status: DC

## 2022-02-09 NOTE — Patient Instructions (Signed)
Stop Rexulti and Prozac and trazodone ?Start Seroquel XR 150 mg nightly ?

## 2022-02-09 NOTE — Progress Notes (Signed)
[name] ?[id number] ?03-14-1956 ?66 y.o. ? ?Subjective:  ? ?Patient ID:  Laura Chang is a 66 y.o. (DOB [birth year]) female. ? ?Chief Complaint:  ?Chief Complaint  ?Patient presents with  ? Follow-up  ? Depression  ? Anxiety  ? Sleeping Problem  ? ? ?HPI ?Laura Chang presents to the office today for follow-up of depression and anxiety and ADD. ? ?seen November 12, 2019.  Melted down in 2020.  Went to [employer] in July.  No withdrawal.  1 drink since then.  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 ?She was on no stimulant and no SSRI but was taking Strattera and Wellbutrin.  The following changes were made. ?Stop Strattera. ?OK restart stimulant bc severe ADD ?Restart Adderall 1 daily for a few days and if tolerated then restart 1 twice daily. ?If not tolerated reduce the dosage if needed. ?May need to stop Wellbutrin if not tolerating the stimulant.  Yes.  DC Wellbutrin ?Restart Prozac 20 mg daily. ? ?February 2021 appointment with the following noted: ?Completed grant proposal.  Couldn't doit without Adderall.  Sold a bunch of work.   ?Adderall XR lasts about 3 pm.  Strength seems about right.  BP been OK.  Not jittery.   ?Stopped Wellbutrin but had no SE. ?Mood drastically better with grant proposal and back on fluoxetine.  Less depressed and lethargic.  No anxiety.  Cut back on coffee. ?Started back with devotions and stronger faith. ?Plan: Continue Prozac 20 mg daily. ?May have to increase the dose at some point in the future given that she usually was taking higher dosages but she is getting good response at this time. ?Restart Wellbutrin off label for ADD since can't get 2 ADDERALL daily. ?150 mg daily then 300 mg daily. ?She can adjust the dose between 150 mg and 300 mg daily to get the optimal effect.  ? ?05/11/2020 appointment with the following noted: ?Has been inconsistent with Prozac and Wellbutrin. ?Not sure of the effect of Wellbutrin. ?Biggest deterrent in work is  anxiety.  Some of the work is conceptual and difficult at times.  ?Can feel she's not up to a project at times.  Overall is OK but would like a steadier benefit from stimulant.  Exhausted from managing concentration and keeping up with things from the day.  Loses things.  Not good keeping up with schedule. ?Overall productive and emotionally OK. ?Can feel Adderall wear off. ?Mood is better in summer and worse in the winter.   ?F died in 09-22-23 and that is a loss. ?No SE Wellbutrin. ?Still attends AA meetings.  Real benefit from [place] last year. ?Recognizes effect of anemia on ADD and mood.  Had iron infusions last winter. ?Plan:  Wellbutrin off label for ADD since can't get 2 ADDERALL daily. ?150 mg daily then 300 mg daily. ? ?01/24/2021 appointment with following noted: ?Doing a program called [employer] mindfulness app since Xmas.  CBT app helped the depression.  App helped her focus better.  Lost sign weight. ?Writing a lot. ?Before Xmas felt depressed and started negative thinking worse, self denigrating. ?Not drinking. ?More isolated.   ?Recognizes mo is narcissist.    Didn't tell anyone she was born until 3 mos later.  M aloof and uninterested in pt.  Lied about her birthday.  Mo lack of affection even with pt's kids. ?Going to [place] [employer] for a year and it helped her to quit drinking. ?Also misses kids being gone  with a hole also. ?Plan: No med changes ? ?05/04/2021 appointment with the following noted: ?Therapist [name] thinks she's manic. ?Lost weight to 144#.   ?States she is still sleeping okay.  Admits she is hyper and recognizes that she is likely manic.  She feels great, euphoric with an increased sense of spiritual connectedness to God.  She has racing thoughts and talks fast and talks a lot and this is noted by her husband.  He thinks she is a bit hyper.  She has been able to maintain sobriety although she will have 1 glass of wine on special occasions but does not drink by herself.  She is not  drinking to excess.  She denies any dangerous impulsivity.  She is clearly not depressed and not particularly anxious.  She has no concerns about her medication and she has been compliant. ? ?06/16/21 appt noted: ?So much better.  Going through a lot but the manic thing happened on top of it.  So much slower.  Didn't feel like losing anything with risperidone.  Likes the Adderalll at 10 mg. Some drowsiness in the AM and very drowsy from risperidone 2 mg HS. ?Prayer life is better. Handling stress better. ?Less depressed with risperidone. ?Still likes trazodone. Sleeps well. ?Plan: Reduce Prozac to 10 mg daily.  Consider stopping it because it can feel the mania however she is reluctant to do that because she fears relapse of depression. ?Reduce risperidone to 1.5 mg nightly due to side effects.  Discussed risk of worsening mania. ? ?07/25/2021 appointment with the following noted: ?Misses the Adderall and hard to function without it. ?Depressed now. Heavy chest.  Anxious and guilty.  Body feels heavy.   ?Hates Wellbutrin.   ?Plan: Increase fluoxetine to 20 mg daily ?Add Abilify 1/2 of 15 mg tablet daily ?Wean wellbutrin by 1 tablet each week  bc she feels it is not helpful and DT polypharmacy ?Reduce risperidone to 1 daily for 1 week and stop it. Disc risk of mania. ?Increase Adderall to XR 20 mg AM ? ?08/08/21 ?Much less depressed and starting to feel normal ?I feel a lot better. ?No SE.  Speech normal off risperidone. ?Sleeping OK on trazaodone and enough.   ?Noticed benefit from Adderall again. ?Plan: continue fluoxetine to 20 mg daily ?Continue Abilify 1/2 of 15 mg tablet daily for depression and mania ?continue Adderall to XR 20 mg AM ? ?10/10/2021 phone call: Pt stated she feels like the Abilify should be decreased to 5mg .She said she is depressed but rational and not suicidal.She has an appt Monday and can wait until then if you prefer. ?MD response: Reduce the Abilify to 7.5 mg every other day.  We will meet on  10/16/2021 and decide what to do from there. ? ?10/16/2021 appointment with the following noted: ?More depressed.  Most depressed I've ever been.  Just numb.  Sense of grief.   ?Thinks the manic episode was unlike anything else she ever had.  Doesn't want to medicate against it.  Don't enjoy people.  Easily overwhelmed.  Had some death thoughts but not suicidal.  Has been functional.  Feels better today after reducing Abilify to every other day but she is only been doing that for 3 days. ?A/P: Episode of post manic depression was explained. ?continue fluoxetine to 20 mg daily ?Hold Abilify for 1 week then resume Abilify 1/2 of 15 mg tablet every other day for depression and mania ?continue Adderall to XR 20 mg AM ? ?10/27/2021  appointment with the following noted: ?I'm doing so much better.  Handling the depression better. Better self talk and spiritual focus has helped.   ?Dep 6/10 manifesting as anxiety with low confidence.   ?F died 2  years ago and M 65 yo and is dependent . ?She is working hard to feel better but still feels depressed.  She almost feels like she has a little more anxiety since restarting Abilify every other day. ?Plan: continue fluoxetine to 20 mg daily ?DC Abilify .  Vrayalar 1.5 mg QOD to try to get rid of depression ASAP. ?continue Adderall to XR 20 mg AM ? ?11/10/2021 appointment with the following noted: ?Busy with [employer] and it was fun with family but then a big let down.  Did well with it.  Functioned well with it.  Working hard on things with depression.  Not shutting down. ?Not sure but feels better today but yesterday was hard.  Difficulty dealing with mother.  She won't do anything to help herself.  Yesterday with her all day.  Won't do PT and has isolated herself.    Lack of confidence.   ?No SE with Vraylar. ? ?11/24/21 urgent appointment appt noted: ?More and more depressed.   ?So anxious and doesn't want to be alone but can do so. ?No appetite. ?Hurts inside. ?Has had some fleeting  suicidal thoughts but would not act on them.  Tolerating meds. ?Has been consistent with Vraylar 1.5 mg every other day, fluoxetine 20 mg daily ?Plan: Increase Vraylar to 1.5 mg daily ?Change Prozac to T

## 2022-02-14 ENCOUNTER — Other Ambulatory Visit: Payer: Self-pay | Admitting: Psychiatry

## 2022-02-14 DIAGNOSIS — F411 Generalized anxiety disorder: Secondary | ICD-10-CM

## 2022-02-14 DIAGNOSIS — F3130 Bipolar disorder, current episode depressed, mild or moderate severity, unspecified: Secondary | ICD-10-CM

## 2022-02-14 DIAGNOSIS — F5105 Insomnia due to other mental disorder: Secondary | ICD-10-CM

## 2022-02-23 ENCOUNTER — Ambulatory Visit: Payer: 59 | Admitting: Psychiatry

## 2022-02-23 ENCOUNTER — Telehealth: Payer: Self-pay | Admitting: Psychiatry

## 2022-02-23 NOTE — Telephone Encounter (Signed)
That is hard to believe frankly.  I have never had a pt get more anxious from Seroquel bc it is sedating.  Notes indicate she was anxious before the med.  If she doesn't believe me, then stop it and wait 3 days.  This will be out of her system in 36 hours.  If she is still anxious then it wasn't the med. ?What I think she should do is increase the Seroquel XR to 300 mg nightly.  The normal dosage range is 150 to 800 mg daily.  I think the reason she doesn't feel better is bc the dose is too low for her. ?

## 2022-02-23 NOTE — Telephone Encounter (Signed)
RTC ? ?I feel worse.  Fatalistic.  Lethargic but anxious.  Hopeless. ?Sleep is fine and not overly sleepy.  Thinks the med is making her worse.   ? ?However these are exactly the symptoms of depression that she has been describing consistently for the last several weeks.  It is my opinion the dose should be increased however we need to prove to her that the medicine is not the cause of her current symptoms.  Therefore stop the Seroquel XR 150 mg tablets.  The medicine will be out of her system and 36 hours.  If she does not feel better by Monday than the medicine was not the problem.  So she will call us back on Monday to let us know how she is doing. ? ?Meredith Staggers, MD, DFAPA ? ?

## 2022-02-23 NOTE — Telephone Encounter (Signed)
Started seroquel xr 150mg  at last visit. ?

## 2022-02-23 NOTE — Telephone Encounter (Signed)
Patient called in regarding medication problem.States that new medication is making her feel super anxious. She doesn't think the medication is for her and just doesn't feel right. Wondering if she should she should just stop taking it. She would like a rtc ASAP  [phone] ?

## 2022-02-23 NOTE — Telephone Encounter (Signed)
Dr. Jennelle Human will call patient.  ?

## 2022-02-26 ENCOUNTER — Telehealth: Payer: Self-pay | Admitting: Psychiatry

## 2022-02-26 ENCOUNTER — Other Ambulatory Visit: Payer: Self-pay | Admitting: Psychiatry

## 2022-02-26 DIAGNOSIS — F900 Attention-deficit hyperactivity disorder, predominantly inattentive type: Secondary | ICD-10-CM

## 2022-02-26 MED ORDER — AMPHETAMINE-DEXTROAMPHET ER 20 MG PO CP24: 20.0000 mg | ORAL_CAPSULE | ORAL | 0 refills | Status: DC

## 2022-02-26 NOTE — Telephone Encounter (Signed)
Next visit is 03/02/22. Laura Chang called requesting a refill on her Adderall XR 20 mg called to: ? ?[id number] - [place], [place] - [address] ? ?Phone:  [phone]  ?Fax:  [phone]  ? ? ? ? ? ?

## 2022-02-27 NOTE — Telephone Encounter (Signed)
LM to call me back with an update.  ?

## 2022-02-27 NOTE — Telephone Encounter (Signed)
Pt called back and reports she does feel some better since stopping Seroquel, feels less anxious but obviously still depressed. Seems very excited about getting started with Spravato, I did discuss how it works and recommend she pick up a trainer nasal spray when she comes in on Friday to practice with. Informed her it's a process to get going but we would get to it as soon as we could and verify benefits with her insurance.  ? ? ?

## 2022-03-01 ENCOUNTER — Other Ambulatory Visit: Payer: Self-pay | Admitting: Psychiatry

## 2022-03-02 ENCOUNTER — Ambulatory Visit (INDEPENDENT_AMBULATORY_CARE_PROVIDER_SITE_OTHER): Payer: 59 | Admitting: Psychiatry

## 2022-03-02 ENCOUNTER — Encounter: Payer: Self-pay | Admitting: Psychiatry

## 2022-03-02 DIAGNOSIS — F411 Generalized anxiety disorder: Secondary | ICD-10-CM

## 2022-03-02 DIAGNOSIS — F5105 Insomnia due to other mental disorder: Secondary | ICD-10-CM

## 2022-03-02 DIAGNOSIS — F900 Attention-deficit hyperactivity disorder, predominantly inattentive type: Secondary | ICD-10-CM

## 2022-03-02 DIAGNOSIS — F339 Major depressive disorder, recurrent, unspecified: Secondary | ICD-10-CM

## 2022-03-02 NOTE — Progress Notes (Signed)
[name] ?[id number] ?[birth year] ?66 y.o. ? ?Subjective:  ? ?Patient ID:  Laura Chang is a 66 y.o. (DOB [birth year]) female. ? ?Chief Complaint:  ?No chief complaint on file. ? ? ?HPI ?Laura Chang presents to the office today for follow-up of depression and anxiety and ADD. ? ?seen November 12, 2019.  Melted down in 2020.  Went to [employer] in July.  No withdrawal.  1 drink since then.  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 ?She was on no stimulant and no SSRI but was taking Strattera and Wellbutrin.  The following changes were made. ?Stop Strattera. ?OK restart stimulant bc severe ADD ?Restart Adderall 1 daily for a few days and if tolerated then restart 1 twice daily. ?If not tolerated reduce the dosage if needed. ?May need to stop Wellbutrin if not tolerating the stimulant.  Yes.  DC Wellbutrin ?Restart Prozac 20 mg daily. ? ?February 2021 appointment with the following noted: ?Completed grant proposal.  Couldn't doit without Adderall.  Sold a bunch of work.   ?Adderall XR lasts about 3 pm.  Strength seems about right.  BP been OK.  Not jittery.   ?Stopped Wellbutrin but had no SE. ?Mood drastically better with grant proposal and back on fluoxetine.  Less depressed and lethargic.  No anxiety.  Cut back on coffee. ?Started back with devotions and stronger faith. ?Plan: Continue Prozac 20 mg daily. ?May have to increase the dose at some point in the future given that she usually was taking higher dosages but she is getting good response at this time. ?Restart Wellbutrin off label for ADD since can't get 2 ADDERALL daily. ?150 mg daily then 300 mg daily. ?She can adjust the dose between 150 mg and 300 mg daily to get the optimal effect.  ? ?05/11/2020 appointment with the following noted: ?Has been inconsistent with Prozac and Wellbutrin. ?Not sure of the effect of Wellbutrin. ?Biggest deterrent in work is anxiety.  Some of the work is conceptual and difficult at times.  ?Can  feel she's not up to a project at times.  Overall is OK but would like a steadier benefit from stimulant.  Exhausted from managing concentration and keeping up with things from the day.  Loses things.  Not good keeping up with schedule. ?Overall productive and emotionally OK. ?Can feel Adderall wear off. ?Mood is better in summer and worse in the winter.   ?F died in October 01, 2023 and that is a loss. ?No SE Wellbutrin. ?Still attends AA meetings.  Real benefit from [place] last year. ?Recognizes effect of anemia on ADD and mood.  Had iron infusions last winter. ?Plan:  Wellbutrin off label for ADD since can't get 2 ADDERALL daily. ?150 mg daily then 300 mg daily. ? ?01/24/2021 appointment with following noted: ?Doing a program called [employer] mindfulness app since Xmas.  CBT app helped the depression.  App helped her focus better.  Lost sign weight. ?Writing a lot. ?Before Xmas felt depressed and started negative thinking worse, self denigrating. ?Not drinking. ?More isolated.   ?Recognizes mo is narcissist.    Didn't tell anyone she was born until 3 mos later.  M aloof and uninterested in pt.  Lied about her birthday.  Mo lack of affection even with pt's kids. ?Going to AA for a year and it helped her to quit drinking. ?Also misses kids being gone with a hole also. ?Plan: No med changes ? ?05/04/2021 appointment with the following noted: ?  Therapist [name] thinks she's manic. ?Lost weight to 144#.   ?States she is still sleeping okay.  Admits she is hyper and recognizes that she is likely manic.  She feels great, euphoric with an increased sense of spiritual connectedness to God.  She has racing thoughts and talks fast and talks a lot and this is noted by her husband.  He thinks she is a bit hyper.  She has been able to maintain sobriety although she will have 1 glass of wine on special occasions but does not drink by herself.  She is not drinking to excess.  She denies any dangerous impulsivity.  She is  clearly not depressed and not particularly anxious.  She has no concerns about her medication and she has been compliant. ? ?06/16/21 appt noted: ?So much better.  Going through a lot but the manic thing happened on top of it.  So much slower.  Didn't feel like losing anything with risperidone.  Likes the Adderalll at 10 mg. Some drowsiness in the AM and very drowsy from risperidone 2 mg HS. ?Prayer life is better. Handling stress better. ?Less depressed with risperidone. ?Still likes trazodone. Sleeps well. ?Plan: Reduce Prozac to 10 mg daily.  Consider stopping it because it can feel the mania however she is reluctant to do that because she fears relapse of depression. ?Reduce risperidone to 1.5 mg nightly due to side effects.  Discussed risk of worsening mania. ? ?07/25/2021 appointment with the following noted: ?Misses the Adderall and hard to function without it. ?Depressed now. Heavy chest.  Anxious and guilty.  Body feels heavy.   ?Hates Wellbutrin.   ?Plan: Increase fluoxetine to 20 mg daily ?Add Abilify 1/2 of 15 mg tablet daily ?Wean wellbutrin by 1 tablet each week  bc she feels it is not helpful and DT polypharmacy ?Reduce risperidone to 1 daily for 1 week and stop it. Disc risk of mania. ?Increase Adderall to XR 20 mg AM ? ?08/08/21 ?Much less depressed and starting to feel normal ?I feel a lot better. ?No SE.  Speech normal off risperidone. ?Sleeping OK on trazaodone and enough.   ?Noticed benefit from Adderall again. ?Plan: continue fluoxetine to 20 mg daily ?Continue Abilify 1/2 of 15 mg tablet daily for depression and mania ?continue Adderall to XR 20 mg AM ? ?10/10/2021 phone call: Pt stated she feels like the Abilify should be decreased to 5mg .She said she is depressed but rational and not suicidal.She has an appt Monday and can wait until then if you prefer. ?MD response: Reduce the Abilify to 7.5 mg every other day.  We will meet on 10/16/2021 and decide what to do from there. ? ?10/16/2021  appointment with the following noted: ?More depressed.  Most depressed I've ever been.  Just numb.  Sense of grief.   ?Thinks the manic episode was unlike anything else she ever had.  Doesn't want to medicate against it.  Don't enjoy people.  Easily overwhelmed.  Had some death thoughts but not suicidal.  Has been functional.  Feels better today after reducing Abilify to every other day but she is only been doing that for 3 days. ?A/P: Episode of post manic depression was explained. ?continue fluoxetine to 20 mg daily ?Hold Abilify for 1 week then resume Abilify 1/2 of 15 mg tablet every other day for depression and mania ?continue Adderall to XR 20 mg AM ? ?10/27/2021 appointment with the following noted: ?I'm doing so much better.  Handling the depression better.  Better self talk and spiritual focus has helped.   ?Dep 6/10 manifesting as anxiety with low confidence.   ?F died 2  years ago and M 65 yo and is dependent . ?She is working hard to feel better but still feels depressed.  She almost feels like she has a little more anxiety since restarting Abilify every other day. ?Plan: continue fluoxetine to 20 mg daily ?DC Abilify .  Vrayalar 1.5 mg QOD to try to get rid of depression ASAP. ?continue Adderall to XR 20 mg AM ? ?11/10/2021 appointment with the following noted: ?Busy with Xmas and it was fun with family but then a big let down.  Did well with it.  Functioned well with it.  Working hard on things with depression.  Not shutting down. ?Not sure but feels better today but yesterday was hard.  Difficulty dealing with mother.  She won't do anything to help herself.  Yesterday with her all day.  Won't do PT and has isolated herself.    Lack of confidence.   ?No SE with Vraylar. ? ?11/24/21 urgent appointment appt noted: ?More and more depressed.   ?So anxious and doesn't want to be alone but can do so. ?No appetite. ?Hurts inside. ?Has had some fleeting suicidal thoughts but would not act on them.  Tolerating  meds. ?Has been consistent with Vraylar 1.5 mg every other day, fluoxetine 20 mg daily ?Plan: Increase Vraylar to 1.5 mg daily ?Change Prozac to Trintellix 10 mg daily. ?Discussed side effects of each ?continue Addera

## 2022-03-03 ENCOUNTER — Other Ambulatory Visit: Payer: Self-pay | Admitting: Hematology

## 2022-03-04 ENCOUNTER — Encounter: Payer: Self-pay | Admitting: Hematology

## 2022-03-09 ENCOUNTER — Telehealth: Payer: Self-pay | Admitting: Psychiatry

## 2022-03-09 ENCOUNTER — Other Ambulatory Visit: Payer: Self-pay

## 2022-03-09 DIAGNOSIS — F5105 Insomnia due to other mental disorder: Secondary | ICD-10-CM

## 2022-03-09 DIAGNOSIS — F411 Generalized anxiety disorder: Secondary | ICD-10-CM

## 2022-03-09 DIAGNOSIS — F3130 Bipolar disorder, current episode depressed, mild or moderate severity, unspecified: Secondary | ICD-10-CM

## 2022-03-09 MED ORDER — LORAZEPAM 1 MG PO TABS: 1.0000 mg | ORAL_TABLET | Freq: Three times a day (TID) | ORAL | 0 refills | Status: DC

## 2022-03-09 NOTE — Telephone Encounter (Signed)
Pt would like refill of Lorazepam sent to [facility]. ? ?No upcoming appt scheduled. ?

## 2022-03-09 NOTE — Telephone Encounter (Signed)
Pended to [place], covering for CC. Last filled 3/31 ?

## 2022-03-12 ENCOUNTER — Telehealth: Payer: Self-pay

## 2022-03-12 ENCOUNTER — Other Ambulatory Visit: Payer: Self-pay

## 2022-03-12 ENCOUNTER — Other Ambulatory Visit: Payer: Self-pay | Admitting: Psychiatry

## 2022-03-12 MED ORDER — SPRAVATO (56 MG DOSE) 28 MG/DEVICE NA SOPK: PACK | NASAL | 0 refills | Status: DC

## 2022-03-12 MED ORDER — SPRAVATO (84 MG DOSE) 28 MG/DEVICE NA SOPK: PACK | NASAL | 5 refills | Status: DC

## 2022-03-12 NOTE — Telephone Encounter (Signed)
Prior Authorization submitted and approved for SPRAVATO 56 MG and 84 mg effective 03/07/2022-06/06/2022 with [employer] PA# [id number] ?

## 2022-03-14 ENCOUNTER — Ambulatory Visit: Payer: 59

## 2022-03-14 ENCOUNTER — Ambulatory Visit (INDEPENDENT_AMBULATORY_CARE_PROVIDER_SITE_OTHER): Payer: 59 | Admitting: Psychiatry

## 2022-03-14 VITALS — BP 155/94 | HR 59

## 2022-03-14 DIAGNOSIS — F411 Generalized anxiety disorder: Secondary | ICD-10-CM | POA: Diagnosis not present

## 2022-03-14 DIAGNOSIS — F5105 Insomnia due to other mental disorder: Secondary | ICD-10-CM

## 2022-03-14 DIAGNOSIS — F900 Attention-deficit hyperactivity disorder, predominantly inattentive type: Secondary | ICD-10-CM

## 2022-03-14 DIAGNOSIS — F339 Major depressive disorder, recurrent, unspecified: Secondary | ICD-10-CM

## 2022-03-16 ENCOUNTER — Ambulatory Visit (INDEPENDENT_AMBULATORY_CARE_PROVIDER_SITE_OTHER): Payer: 59 | Admitting: Psychiatry

## 2022-03-16 ENCOUNTER — Encounter: Payer: Self-pay | Admitting: Psychiatry

## 2022-03-16 ENCOUNTER — Ambulatory Visit: Payer: 59

## 2022-03-16 VITALS — BP 150/87 | HR 56

## 2022-03-16 DIAGNOSIS — F339 Major depressive disorder, recurrent, unspecified: Secondary | ICD-10-CM | POA: Diagnosis not present

## 2022-03-16 DIAGNOSIS — F5105 Insomnia due to other mental disorder: Secondary | ICD-10-CM | POA: Diagnosis not present

## 2022-03-16 DIAGNOSIS — F411 Generalized anxiety disorder: Secondary | ICD-10-CM

## 2022-03-16 DIAGNOSIS — F900 Attention-deficit hyperactivity disorder, predominantly inattentive type: Secondary | ICD-10-CM | POA: Diagnosis not present

## 2022-03-16 NOTE — Progress Notes (Signed)
Laura Chang ?[id number] ?1956-06-25 ?66 y.o. ? ?Subjective:  ? ?Patient ID:  Laura Chang is a 66 y.o. (DOB 16-Jan-1956) female. ? ?Chief Complaint:  ?Chief Complaint  ?Patient presents with  ? Follow-up  ? Depression  ? ADD  ? Anxiety  ? ? ?HPI ?Laura Chang presents to the office today for follow-up of depression and anxiety and ADD. ? ?seen November 12, 2019.  Melted down in 2020.  Went to [employer] in July.  No withdrawal.  1 drink since then.  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 ?She was on no stimulant and no SSRI but was taking Strattera and Wellbutrin.  The following changes were made. ?Stop Strattera. ?OK restart stimulant bc severe ADD ?Restart Adderall 1 daily for a few days and if tolerated then restart 1 twice daily. ?If not tolerated reduce the dosage if needed. ?May need to stop Wellbutrin if not tolerating the stimulant.  Yes.  DC Wellbutrin ?Restart Prozac 20 mg daily. ? ?February 2021 appointment with the following noted: ?Completed grant proposal.  Couldn't doit without Adderall.  Sold a bunch of work.   ?Adderall XR lasts about 3 pm.  Strength seems about right.  BP been OK.  Not jittery.   ?Stopped Wellbutrin but had no SE. ?Mood drastically better with grant proposal and back on fluoxetine.  Less depressed and lethargic.  No anxiety.  Cut back on coffee. ?Started back with devotions and stronger faith. ?Plan: Continue Prozac 20 mg daily. ?May have to increase the dose at some point in the future given that she usually was taking higher dosages but she is getting good response at this time. ?Restart Wellbutrin off label for ADD since can't get 2 ADDERALL daily. ?150 mg daily then 300 mg daily. ?She can adjust the dose between 150 mg and 300 mg daily to get the optimal effect.  ? ?05/11/2020 appointment with the following noted: ?Has been inconsistent with Prozac and Wellbutrin. ?Not sure of the effect of Wellbutrin. ?Biggest deterrent in work is anxiety.   Some of the work is conceptual and difficult at times.  ?Can feel she's not up to a project at times.  Overall is OK but would like a steadier benefit from stimulant.  Exhausted from managing concentration and keeping up with things from the day.  Loses things.  Not good keeping up with schedule. ?Overall productive and emotionally OK. ?Can feel Adderall wear off. ?Mood is better in summer and worse in the winter.   ?F died in 2023-10-10 and that is a loss. ?No SE Wellbutrin. ?Still attends AA meetings.  Real benefit from [place] last year. ?Recognizes effect of anemia on ADD and mood.  Had iron infusions last winter. ?Plan:  Wellbutrin off label for ADD since can't get 2 ADDERALL daily. ?150 mg daily then 300 mg daily. ? ?01/24/2021 appointment with following noted: ?Doing a program called [employer] mindfulness app since Xmas.  CBT app helped the depression.  App helped her focus better.  Lost sign weight. ?Writing a lot. ?Before Xmas felt depressed and started negative thinking worse, self denigrating. ?Not drinking. ?More isolated.   ?Recognizes mo is narcissist.    Didn't tell anyone she was born until 3 mos later.  M aloof and uninterested in pt.  Lied about her birthday.  Mo lack of affection even with pt's kids. ?Going to [place] for a year and it helped her to quit drinking. ?Also misses kids being gone with  a hole also. ?Plan: No med changes ? ?05/04/2021 appointment with the following noted: ?Therapist [name] thinks she's manic. ?Lost weight to 144#.   ?States she is still sleeping okay.  Admits she is hyper and recognizes that she is likely manic.  She feels great, euphoric with an increased sense of spiritual connectedness to God.  She has racing thoughts and talks fast and talks a lot and this is noted by her husband.  He thinks she is a bit hyper.  She has been able to maintain sobriety although she will have 1 glass of wine on special occasions but does not drink by herself.  She is not drinking to  excess.  She denies any dangerous impulsivity.  She is clearly not depressed and not particularly anxious.  She has no concerns about her medication and she has been compliant. ? ?06/16/21 appt noted: ?So much better.  Going through a lot but the manic thing happened on top of it.  So much slower.  Didn't feel like losing anything with risperidone.  Likes the Adderalll at 10 mg. Some drowsiness in the AM and very drowsy from risperidone 2 mg HS. ?Prayer life is better. Handling stress better. ?Less depressed with risperidone. ?Still likes trazodone. Sleeps well. ?Plan: Reduce Prozac to 10 mg daily.  Consider stopping it because it can feel the mania however she is reluctant to do that because she fears relapse of depression. ?Reduce risperidone to 1.5 mg nightly due to side effects.  Discussed risk of worsening mania. ? ?07/25/2021 appointment with the following noted: ?Misses the Adderall and hard to function without it. ?Depressed now. Heavy chest.  Anxious and guilty.  Body feels heavy.   ?Hates Wellbutrin.   ?Plan: Increase fluoxetine to 20 mg daily ?Add Abilify 1/2 of 15 mg tablet daily ?Wean wellbutrin by 1 tablet each week  bc she feels it is not helpful and DT polypharmacy ?Reduce risperidone to 1 daily for 1 week and stop it. Disc risk of mania. ?Increase Adderall to XR 20 mg AM ? ?08/08/21 ?Much less depressed and starting to feel normal ?I feel a lot better. ?No SE.  Speech normal off risperidone. ?Sleeping OK on trazaodone and enough.   ?Noticed benefit from Adderall again. ?Plan: continue fluoxetine to 20 mg daily ?Continue Abilify 1/2 of 15 mg tablet daily for depression and mania ?continue Adderall to XR 20 mg AM ? ?10/10/2021 phone call: Pt stated she feels like the Abilify should be decreased to 5mg .She said she is depressed but rational and not suicidal.She has an appt Monday and can wait until then if you prefer. ?MD response: Reduce the Abilify to 7.5 mg every other day.  We will meet on 10/16/2021  and decide what to do from there. ? ?10/16/2021 appointment with the following noted: ?More depressed.  Most depressed I've ever been.  Just numb.  Sense of grief.   ?Thinks the manic episode was unlike anything else she ever had.  Doesn't want to medicate against it.  Don't enjoy people.  Easily overwhelmed.  Had some death thoughts but not suicidal.  Has been functional.  Feels better today after reducing Abilify to every other day but she is only been doing that for 3 days. ?A/P: Episode of post manic depression was explained. ?continue fluoxetine to 20 mg daily ?Hold Abilify for 1 week then resume Abilify 1/2 of 15 mg tablet every other day for depression and mania ?continue Adderall to XR 20 mg AM ? ?10/27/2021 appointment  with the following noted: ?I'm doing so much better.  Handling the depression better. Better self talk and spiritual focus has helped.   ?Dep 6/10 manifesting as anxiety with low confidence.   ?F died 2  years ago and M 65 yo and is dependent . ?She is working hard to feel better but still feels depressed.  She almost feels like she has a little more anxiety since restarting Abilify every other day. ?Plan: continue fluoxetine to 20 mg daily ?DC Abilify .  Vrayalar 1.5 mg QOD to try to get rid of depression ASAP. ?continue Adderall to XR 20 mg AM ? ?11/10/2021 appointment with the following noted: ?Busy with [place] [employer] and it was fun with family but then a big let down.  Did well with it.  Functioned well with it.  Working hard on things with depression.  Not shutting down. ?Not sure but feels better today but yesterday was hard.  Difficulty dealing with mother.  She won't do anything to help herself.  Yesterday with her all day.  Won't do PT and has isolated herself.    Lack of confidence.   ?No SE with Vraylar. ? ?11/24/21 urgent appointment appt noted: ?More and more depressed.   ?So anxious and doesn't want to be alone but can do so. ?No appetite. ?Hurts inside. ?Has had some fleeting suicidal  thoughts but would not act on them.  Tolerating meds. ?Has been consistent with Vraylar 1.5 mg every other day, fluoxetine 20 mg daily ?Plan: Increase Vraylar to 1.5 mg daily ?Change Prozac to Trintellix 10

## 2022-03-16 NOTE — Progress Notes (Signed)
Laura Chang ?[id number] ?01/17/1956 ?66 y.o. ? ?Subjective:  ? ?Patient ID:  Laura Chang is a 66 y.o. (DOB July 31, 1956) female. ? ?Chief Complaint:  ?Chief Complaint  ?Patient presents with  ? Follow-up  ? Depression  ? ADD  ? ? ?HPI ?Laura Chang presents to the office today for follow-up of depression and anxiety and ADD. ? ?seen November 12, 2019.  Melted down in 2020.  Went to [facility] in July.  No withdrawal.  1 drink since then.  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 ?She was on no stimulant and no SSRI but was taking Strattera and Wellbutrin.  The following changes were made. ?Stop Strattera. ?OK restart stimulant bc severe ADD ?Restart Adderall 1 daily for a few days and if tolerated then restart 1 twice daily. ?If not tolerated reduce the dosage if needed. ?May need to stop Wellbutrin if not tolerating the stimulant.  Yes.  DC Wellbutrin ?Restart Prozac 20 mg daily. ? ?February 2021 appointment with the following noted: ?Completed grant proposal.  Couldn't doit without Adderall.  Sold a bunch of work.   ?Adderall XR lasts about 3 pm.  Strength seems about right.  BP been OK.  Not jittery.   ?Stopped Wellbutrin but had no SE. ?Mood drastically better with grant proposal and back on fluoxetine.  Less depressed and lethargic.  No anxiety.  Cut back on coffee. ?Started back with devotions and stronger faith. ?Plan: Continue Prozac 20 mg daily. ?May have to increase the dose at some point in the future given that she usually was taking higher dosages but she is getting good response at this time. ?Restart Wellbutrin off label for ADD since can't get 2 ADDERALL daily. ?150 mg daily then 300 mg daily. ?She can adjust the dose between 150 mg and 300 mg daily to get the optimal effect.  ? ?05/11/2020 appointment with the following noted: ?Has been inconsistent with Prozac and Wellbutrin. ?Not sure of the effect of Wellbutrin. ?Biggest deterrent in work is anxiety.  Some of the  work is conceptual and difficult at times.  ?Can feel she's not up to a project at times.  Overall is OK but would like a steadier benefit from stimulant.  Exhausted from managing concentration and keeping up with things from the day.  Loses things.  Not good keeping up with schedule. ?Overall productive and emotionally OK. ?Can feel Adderall wear off. ?Mood is better in summer and worse in the winter.   ?F died in 10-04-23 and that is a loss. ?No SE Wellbutrin. ?Still attends AA meetings.  Real benefit from [facility] last year. ?Recognizes effect of anemia on ADD and mood.  Had iron infusions last winter. ?Plan:  Wellbutrin off label for ADD since can't get 2 ADDERALL daily. ?150 mg daily then 300 mg daily. ? ?01/24/2021 appointment with following noted: ?Doing a program called [employer] mindfulness app since Xmas.  CBT app helped the depression.  App helped her focus better.  Lost sign weight. ?Writing a lot. ?Before Xmas felt depressed and started negative thinking worse, self denigrating. ?Not drinking. ?More isolated.   ?Recognizes mo is narcissist.    Didn't tell anyone she was born until 3 mos later.  M aloof and uninterested in pt.  Lied about her birthday.  Mo lack of affection even with pt's kids. ?Going to AA for a year and it helped her to quit drinking. ?Also misses kids being gone with a hole also. ?  Plan: No med changes ? ?05/04/2021 appointment with the following noted: ?Therapist Wallie CharPaula Pile thinks she's manic. ?Lost weight to 144#.   ?States she is still sleeping okay.  Admits she is hyper and recognizes that she is likely manic.  She feels great, euphoric with an increased sense of spiritual connectedness to God.  She has racing thoughts and talks fast and talks a lot and this is noted by her husband.  He thinks she is a bit hyper.  She has been able to maintain sobriety although she will have 1 glass of wine on special occasions but does not drink by herself.  She is not drinking to excess.  She  denies any dangerous impulsivity.  She is clearly not depressed and not particularly anxious.  She has no concerns about her medication and she has been compliant. ? ?06/16/21 appt noted: ?So much better.  Going through a lot but the manic thing happened on top of it.  So much slower.  Didn't feel like losing anything with risperidone.  Likes the Adderalll at 10 mg. Some drowsiness in the AM and very drowsy from risperidone 2 mg HS. ?Prayer life is better. Handling stress better. ?Less depressed with risperidone. ?Still likes trazodone. Sleeps well. ?Plan: Reduce Prozac to 10 mg daily.  Consider stopping it because it can feel the mania however she is reluctant to do that because she fears relapse of depression. ?Reduce risperidone to 1.5 mg nightly due to side effects.  Discussed risk of worsening mania. ? ?07/25/2021 appointment with the following noted: ?Misses the Adderall and hard to function without it. ?Depressed now. Heavy chest.  Anxious and guilty.  Body feels heavy.   ?Hates Wellbutrin.   ?Plan: Increase fluoxetine to 20 mg daily ?Add Abilify 1/2 of 15 mg tablet daily ?Wean wellbutrin by 1 tablet each week  bc she feels it is not helpful and DT polypharmacy ?Reduce risperidone to 1 daily for 1 week and stop it. Disc risk of mania. ?Increase Adderall to XR 20 mg AM ? ?08/08/21 ?Much less depressed and starting to feel normal ?I feel a lot better. ?No SE.  Speech normal off risperidone. ?Sleeping OK on trazaodone and enough.   ?Noticed benefit from Adderall again. ?Plan: continue fluoxetine to 20 mg daily ?Continue Abilify 1/2 of 15 mg tablet daily for depression and mania ?continue Adderall to XR 20 mg AM ? ?10/10/2021 phone call: Pt stated she feels like the Abilify should be decreased to 5mg .She said she is depressed but rational and not suicidal.She has an appt Monday and can wait until then if you prefer. ?MD response: Reduce the Abilify to 7.5 mg every other day.  We will meet on 10/16/2021 and decide what  to do from there. ? ?10/16/2021 appointment with the following noted: ?More depressed.  Most depressed I've ever been.  Just numb.  Sense of grief.   ?Thinks the manic episode was unlike anything else she ever had.  Doesn't want to medicate against it.  Don't enjoy people.  Easily overwhelmed.  Had some death thoughts but not suicidal.  Has been functional.  Feels better today after reducing Abilify to every other day but she is only been doing that for 3 days. ?A/P: Episode of post manic depression was explained. ?continue fluoxetine to 20 mg daily ?Hold Abilify for 1 week then resume Abilify 1/2 of 15 mg tablet every other day for depression and mania ?continue Adderall to XR 20 mg AM ? ?10/27/2021 appointment with the following  noted: ?I'm doing so much better.  Handling the depression better. Better self talk and spiritual focus has helped.   ?Dep 6/10 manifesting as anxiety with low confidence.   ?F died 2  years ago and M 65 yo and is dependent . ?She is working hard to feel better but still feels depressed.  She almost feels like she has a little more anxiety since restarting Abilify every other day. ?Plan: continue fluoxetine to 20 mg daily ?DC Abilify .  Vrayalar 1.5 mg QOD to try to get rid of depression ASAP. ?continue Adderall to XR 20 mg AM ? ?11/10/2021 appointment with the following noted: ?Busy with [employer] and it was fun with family but then a big let down.  Did well with it.  Functioned well with it.  Working hard on things with depression.  Not shutting down. ?Not sure but feels better today but yesterday was hard.  Difficulty dealing with mother.  She won't do anything to help herself.  Yesterday with her all day.  Won't do PT and has isolated herself.    Lack of confidence.   ?No SE with Vraylar. ? ?11/24/21 urgent appointment appt noted: ?More and more depressed.   ?So anxious and doesn't want to be alone but can do so. ?No appetite. ?Hurts inside. ?Has had some fleeting suicidal thoughts but would  not act on them.  Tolerating meds. ?Has been consistent with Vraylar 1.5 mg every other day, fluoxetine 20 mg daily ?Plan: Increase Vraylar to 1.5 mg daily ?Change Prozac to Trintellix 10 mg daily. ?

## 2022-03-18 NOTE — Progress Notes (Signed)
Nurse visit: ?  ?Patient arrived for her 2nd Spravato treatment. Pt is being treated for Treatment Resistant Depression, the starting dose was 56 mg (2 of the 28 mg) nasal sprays, but today she moves up to 84 mg since she tolerated initial dose and no side effects afterwards. Patient arrived and taken to treatment room.  Answered any questions and concerns the patient had since her last treatment and what to expect today. Confirmed she had a ride home which is her husband would be coming back to pick up pt when done.  Pt's Spravato is ordered through [employer] and delivered to office, all Spravato medication is stored at doctors office per REMS/FDA guidelines. The medication is required to be locked behind two doors per FDA/REMS Protocol. Medication is also disposed of properly per regulations.  ?  ?  ?Began taking patient's vital signs at 9:45 AM 156/91, pulse 58. Instructed patient to blow her nose then recline back to a 45 degree angle. Gave patient first dose 28 mg nasal spray, patient is still trying to get the hang of using the nasal sprays, each nasal spray administered in each nostril as directed and waited 5 minutes between the second and third dose. After all 3 doses given pt did not complain of any nausea/vomiting, given a cup of water due to the taste after the administration of Spravato. Patient doing well, and is experiencing slight dissociation. Checked 40 minute vitals at 10:22 AM, 148/88, pulse 53.  No complaints. Explained she would be monitored for a total time of 120 minutes. Discharge vitals were taken at 11:51 AM 150/87, P 56. Dr. Clovis Pu did come visit with patient and discuss how treatment went at the end. I walked pt to elevator, where her husband met her for the ride home. Recommend she go home and sleep or just relax on the couch. No driving, no intense activities. Verbalized understanding. Pt. will be receiving 2 treatments per week for 4 weeks as recommended. Then reassessed to  go down to 1 treatment weekly if pt is stable at that time. Nurse was with pt a total of 60 minutes for clinical assessment. Pt is scheduled this Tuesday, May 9th. Pt instructed to call office with any problems or questions.  ?  ?  ?LOT [id number] ?EXP [id number] ?

## 2022-03-18 NOTE — Progress Notes (Signed)
Nurse visit: ?  ?Patient arrived for her 1st Spravato treatment. Pt is being treated for Treatment Resistant Depression, the starting dose is 56 mg (2 of the 28 mg) nasal sprays. Patient arrived and taken to treatment room. I explained and discussed things about the treatment and how the schedule should go, along with any side effects that may occur. Answered any questions and concerns the patient had. Let patient use the trainer nasal sprays from to get the feel of how they will work. She verbalized understanding. Confirmed she had a ride home when getting pt from lobby, her husband would be coming back to pick up pt when done.  Pt's Spravato is ordered through [employer] and delivered to office, all Spravato medication is stored at doctors office per REMS/FDA guidelines. The medication is required to be locked behind two doors per FDA/REMS Protocol. Medication is also disposed of properly per regulations.  ?  ?  ?Began taking patient's vital signs at 1:20 PM 129/76, pulse 72. Instructed patient to blow her nose then recline back to a 45 degree angle. Gave patient first dose 28 mg nasal spray at 1:40 PM patient did well, administered in each nostril as directed and waited 5 more minutes for the second dose. After both doses given pt did not complain of any nausea/vomiting, given a cup of water due to the taste after the administration of Spravato. Patient doing well, and is experiencing slight dissociation. Checked 40 minute vitals at 2:25 PM, 152/87, pulse 56.  No complaints. Explained she would be monitored for a total time of 120 minutes. Discharge vitals were taken at 3:45 PM 155/94, P 59. Dr. Clovis Pu did come visit with patient and discuss how treatment went at the end. I walked pt to elevator, where her husband met her for the ride home. Recommend she go home and sleep or just relax on the couch. No driving, no intense activities. Verbalized understanding. Pt. will be receiving 2 treatments per week  for 4 weeks as recommended. Then reassessed to go down to 1 treatment weekly if pt is stable at that time. Nurse was with pt a total of 60 minutes for clinical assessment. Pt is scheduled this Friday, May 5th. Pt instructed to call office with any problems or questions.  ?  ?  ?LOT [id number] ?EXP JUL 2024 ?

## 2022-03-20 ENCOUNTER — Other Ambulatory Visit: Payer: Self-pay

## 2022-03-20 ENCOUNTER — Ambulatory Visit (INDEPENDENT_AMBULATORY_CARE_PROVIDER_SITE_OTHER): Payer: 59 | Admitting: Psychiatry

## 2022-03-20 ENCOUNTER — Ambulatory Visit: Payer: 59

## 2022-03-20 VITALS — BP 177/110 | HR 62

## 2022-03-20 DIAGNOSIS — F411 Generalized anxiety disorder: Secondary | ICD-10-CM | POA: Diagnosis not present

## 2022-03-20 DIAGNOSIS — F339 Major depressive disorder, recurrent, unspecified: Secondary | ICD-10-CM

## 2022-03-20 DIAGNOSIS — I10 Essential (primary) hypertension: Secondary | ICD-10-CM

## 2022-03-20 DIAGNOSIS — F5105 Insomnia due to other mental disorder: Secondary | ICD-10-CM | POA: Diagnosis not present

## 2022-03-20 DIAGNOSIS — F900 Attention-deficit hyperactivity disorder, predominantly inattentive type: Secondary | ICD-10-CM | POA: Diagnosis not present

## 2022-03-20 MED ORDER — QUETIAPINE FUMARATE ER 300 MG PO TB24: 300.0000 mg | ORAL_TABLET | Freq: Every day | ORAL | 0 refills | Status: DC

## 2022-03-20 MED ORDER — CLONIDINE HCL 0.2 MG PO TABS: 0.2000 mg | ORAL_TABLET | Freq: Two times a day (BID) | ORAL | 0 refills | Status: DC

## 2022-03-20 NOTE — Progress Notes (Signed)
[name] ?[id number] ?[birth year] ?66 y.o. ? ?Subjective:  ? ?Patient ID:  Laura Chang is a 66 y.o. (DOB [birth year]) female. ? ?Chief Complaint:  ?No chief complaint on file. ? ? ?HPI ?Laura Chang presents to the office today for follow-up of depression and anxiety and ADD. ? ?seen November 12, 2019.  Melted down in 2020.  Went to [facility] in July.  No withdrawal.  1 drink since then.  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 ?She was on no stimulant and no SSRI but was taking Strattera and Wellbutrin.  The following changes were made. ?Stop Strattera. ?OK restart stimulant bc severe ADD ?Restart Adderall 1 daily for a few days and if tolerated then restart 1 twice daily. ?If not tolerated reduce the dosage if needed. ?May need to stop Wellbutrin if not tolerating the stimulant.  Yes.  DC Wellbutrin ?Restart Prozac 20 mg daily. ? ?February 2021 appointment with the following noted: ?Completed grant proposal.  Couldn't doit without Adderall.  Sold a bunch of work.   ?Adderall XR lasts about 3 pm.  Strength seems about right.  BP been OK.  Not jittery.   ?Stopped Wellbutrin but had no SE. ?Mood drastically better with grant proposal and back on fluoxetine.  Less depressed and lethargic.  No anxiety.  Cut back on coffee. ?Started back with devotions and stronger faith. ?Plan: Continue Prozac 20 mg daily. ?May have to increase the dose at some point in the future given that she usually was taking higher dosages but she is getting good response at this time. ?Restart Wellbutrin off label for ADD since can't get 2 ADDERALL daily. ?150 mg daily then 300 mg daily. ?She can adjust the dose between 150 mg and 300 mg daily to get the optimal effect.  ? ?05/11/2020 appointment with the following noted: ?Has been inconsistent with Prozac and Wellbutrin. ?Not sure of the effect of Wellbutrin. ?Biggest deterrent in work is anxiety.  Some of the work is conceptual and difficult at times.  ?Can  feel she's not up to a project at times.  Overall is OK but would like a steadier benefit from stimulant.  Exhausted from managing concentration and keeping up with things from the day.  Loses things.  Not good keeping up with schedule. ?Overall productive and emotionally OK. ?Can feel Adderall wear off. ?Mood is better in summer and worse in the winter.   ?F died in October 01, 2023 and that is a loss. ?No SE Wellbutrin. ?Still attends AA meetings.  Real benefit from [place] last year. ?Recognizes effect of anemia on ADD and mood.  Had iron infusions last winter. ?Plan:  Wellbutrin off label for ADD since can't get 2 ADDERALL daily. ?150 mg daily then 300 mg daily. ? ?01/24/2021 appointment with following noted: ?Doing a program called [employer] mindfulness app since Xmas.  CBT app helped the depression.  App helped her focus better.  Lost sign weight. ?Writing a lot. ?Before Xmas felt depressed and started negative thinking worse, self denigrating. ?Not drinking. ?More isolated.   ?Recognizes mo is narcissist.    Didn't tell anyone she was born until 3 mos later.  M aloof and uninterested in pt.  Lied about her birthday.  Mo lack of affection even with pt's kids. ?Going to AA for a year and it helped her to quit drinking. ?Also misses kids being gone with a hole also. ?Plan: No med changes ? ?05/04/2021 appointment with the following noted: ?  Therapist [name] thinks she's manic. ?Lost weight to 144#.   ?States she is still sleeping okay.  Admits she is hyper and recognizes that she is likely manic.  She feels great, euphoric with an increased sense of spiritual connectedness to God.  She has racing thoughts and talks fast and talks a lot and this is noted by her husband.  He thinks she is a bit hyper.  She has been able to maintain sobriety although she will have 1 glass of wine on special occasions but does not drink by herself.  She is not drinking to excess.  She denies any dangerous impulsivity.  She is  clearly not depressed and not particularly anxious.  She has no concerns about her medication and she has been compliant. ? ?06/16/21 appt noted: ?So much better.  Going through a lot but the manic thing happened on top of it.  So much slower.  Didn't feel like losing anything with risperidone.  Likes the Adderalll at 10 mg. Some drowsiness in the AM and very drowsy from risperidone 2 mg HS. ?Prayer life is better. Handling stress better. ?Less depressed with risperidone. ?Still likes trazodone. Sleeps well. ?Plan: Reduce Prozac to 10 mg daily.  Consider stopping it because it can feel the mania however she is reluctant to do that because she fears relapse of depression. ?Reduce risperidone to 1.5 mg nightly due to side effects.  Discussed risk of worsening mania. ? ?07/25/2021 appointment with the following noted: ?Misses the Adderall and hard to function without it. ?Depressed now. Heavy chest.  Anxious and guilty.  Body feels heavy.   ?Hates Wellbutrin.   ?Plan: Increase fluoxetine to 20 mg daily ?Add Abilify 1/2 of 15 mg tablet daily ?Wean wellbutrin by 1 tablet each week  bc she feels it is not helpful and DT polypharmacy ?Reduce risperidone to 1 daily for 1 week and stop it. Disc risk of mania. ?Increase Adderall to XR 20 mg AM ? ?08/08/21 ?Much less depressed and starting to feel normal ?I feel a lot better. ?No SE.  Speech normal off risperidone. ?Sleeping OK on trazaodone and enough.   ?Noticed benefit from Adderall again. ?Plan: continue fluoxetine to 20 mg daily ?Continue Abilify 1/2 of 15 mg tablet daily for depression and mania ?continue Adderall to XR 20 mg AM ? ?10/10/2021 phone call: Pt stated she feels like the Abilify should be decreased to 5mg .She said she is depressed but rational and not suicidal.She has an appt Monday and can wait until then if you prefer. ?MD response: Reduce the Abilify to 7.5 mg every other day.  We will meet on 10/16/2021 and decide what to do from there. ? ?10/16/2021  appointment with the following noted: ?More depressed.  Most depressed I've ever been.  Just numb.  Sense of grief.   ?Thinks the manic episode was unlike anything else she ever had.  Doesn't want to medicate against it.  Don't enjoy people.  Easily overwhelmed.  Had some death thoughts but not suicidal.  Has been functional.  Feels better today after reducing Abilify to every other day but she is only been doing that for 3 days. ?A/P: Episode of post manic depression was explained. ?continue fluoxetine to 20 mg daily ?Hold Abilify for 1 week then resume Abilify 1/2 of 15 mg tablet every other day for depression and mania ?continue Adderall to XR 20 mg AM ? ?10/27/2021 appointment with the following noted: ?I'm doing so much better.  Handling the depression better.  Better self talk and spiritual focus has helped.   ?Dep 6/10 manifesting as anxiety with low confidence.   ?F died 2  years ago and M 65 yo and is dependent . ?She is working hard to feel better but still feels depressed.  She almost feels like she has a little more anxiety since restarting Abilify every other day. ?Plan: continue fluoxetine to 20 mg daily ?DC Abilify .  Vrayalar 1.5 mg QOD to try to get rid of depression ASAP. ?continue Adderall to XR 20 mg AM ? ?11/10/2021 appointment with the following noted: ?Busy with [employer] and it was fun with family but then a big let down.  Did well with it.  Functioned well with it.  Working hard on things with depression.  Not shutting down. ?Not sure but feels better today but yesterday was hard.  Difficulty dealing with mother.  She won't do anything to help herself.  Yesterday with her all day.  Won't do PT and has isolated herself.    Lack of confidence.   ?No SE with Vraylar. ? ?11/24/21 urgent appointment appt noted: ?More and more depressed.   ?So anxious and doesn't want to be alone but can do so. ?No appetite. ?Hurts inside. ?Has had some fleeting suicidal thoughts but would not act on them.  Tolerating  meds. ?Has been consistent with Vraylar 1.5 mg every other day, fluoxetine 20 mg daily ?Plan: Increase Vraylar to 1.5 mg daily ?Change Prozac to Trintellix 10 mg daily. ?Discussed side effects of each ?continue Addera

## 2022-03-21 ENCOUNTER — Other Ambulatory Visit: Payer: Self-pay | Admitting: Psychiatry

## 2022-03-21 DIAGNOSIS — F411 Generalized anxiety disorder: Secondary | ICD-10-CM

## 2022-03-21 DIAGNOSIS — F3130 Bipolar disorder, current episode depressed, mild or moderate severity, unspecified: Secondary | ICD-10-CM

## 2022-03-21 DIAGNOSIS — F5105 Insomnia due to other mental disorder: Secondary | ICD-10-CM

## 2022-03-21 NOTE — Progress Notes (Signed)
NURSES NOTE: ? ?Pt arrived for her [facility] this week, this would be her 3rd total treatment. #2 for 84 mg dose. Pt brought to treatment room to have vital signs assessed. Pt was very anxious this morning reporting that she does need to continue to take Seroquel XR 300 mg. She reports after stopping it on Friday she has gotten worse. She reports not sleeping any the previous night. B/P checked at 9:45 AM, 167/114, pulse 72. Checked b/p again and did manually as well, reading is still high, 171/101, pulse 68. Pt given Lorazepam 1 mg about 9:50 AM. The last two visits she has taken Lorazepam 1 mg prior to treatment as well. B/P reassessed at 10:11 AM, 173/109, pulse 65. Dr. Jennelle Human notified of pt's condition and he advises to also give pt Clonidine 0.1 mg and give her 2 tablets at 10:15 AM. Advised pt to relax, listen to music then I would reassess in about 30 minutes. Pt was upset because she really wanted her treatment today, I explained to pt that due to her B/P being elevated I could not begin her treatment until her B/P is 140/90 or below. Reassessed again at 10:40 AM, 180/115, pulse 62. Pt had been laying in chair in treatment room relaxing with her eyes closed. Informed her I would update Dr. Jennelle Human again. Dr. Jennelle Human did come over to talk to pt about her elevated b/p and her lack of sleep the last few nights. Pt agreed to restart Seroquel. Pt then reassessed at 12:02 PM, 177/110, pulse 62. Explained to pt after her sitting at office since 9:30 AM and her B/P is still elevated that she could not get her treatment today. She of course was disappointed but explained hopefully she would sleep tonight and come back in the morning with a better B/P. Pt prescribed more Seroquel and also Clonidine 0.2 mg. Pt also instructed to purchase a blood pressure machine to check it at home. She agreed. Advised her to take her B/P this afternoon and if still elevated to take another Clonidine 0.2 mg. Recheck again at  bedtime and proceed with same instructions if still elevated.  ? ?Informed pt I would contact her in the morning to have her check her blood pressure at home before attempting to come to office again for treatment.  ?Pt will hopefully be able to get her treatments this week.  ?

## 2022-03-22 ENCOUNTER — Ambulatory Visit (INDEPENDENT_AMBULATORY_CARE_PROVIDER_SITE_OTHER): Payer: 59 | Admitting: Psychiatry

## 2022-03-22 ENCOUNTER — Ambulatory Visit: Payer: 59

## 2022-03-22 VITALS — BP 128/77 | HR 50

## 2022-03-22 DIAGNOSIS — F5105 Insomnia due to other mental disorder: Secondary | ICD-10-CM

## 2022-03-22 DIAGNOSIS — F900 Attention-deficit hyperactivity disorder, predominantly inattentive type: Secondary | ICD-10-CM | POA: Diagnosis not present

## 2022-03-22 DIAGNOSIS — F339 Major depressive disorder, recurrent, unspecified: Secondary | ICD-10-CM

## 2022-03-22 DIAGNOSIS — F411 Generalized anxiety disorder: Secondary | ICD-10-CM

## 2022-03-22 DIAGNOSIS — I10 Essential (primary) hypertension: Secondary | ICD-10-CM

## 2022-03-22 NOTE — Progress Notes (Signed)
MORNA FLUD [id number] [birth year] 66 y.o.  Subjective:   Patient ID:  Laura Chang is a 66 y.o. (DOB 1956/06/07) female.  Chief Complaint:  Chief Complaint  Patient presents with   Follow-up   Depression   Anxiety     HPI Laura Chang presents to the office today for follow-up of depression and anxiety and ADD.  seen November 12, 2019.  Melted down in 2020.  Went to [employer] in July.  No withdrawal.  1 drink since then.  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 She was on no stimulant and no SSRI but was taking Strattera and Wellbutrin.  The following changes were made. Stop Strattera. OK restart stimulant bc severe ADD Restart Adderall 1 daily for a few days and if tolerated then restart 1 twice daily. If not tolerated reduce the dosage if needed. May need to stop Wellbutrin if not tolerating the stimulant.  Yes.  DC Wellbutrin Restart Prozac 20 mg daily.  February 2021 appointment with the following noted: Completed grant proposal.  Couldn't doit without Adderall.  Sold a bunch of work.   Adderall XR lasts about 3 pm.  Strength seems about right.  BP been OK.  Not jittery.   Stopped Wellbutrin but had no SE. Mood drastically better with grant proposal and back on fluoxetine.  Less depressed and lethargic.  No anxiety.  Cut back on coffee. Started back with devotions and stronger faith. Plan: Continue Prozac 20 mg daily. May have to increase the dose at some point in the future given that she usually was taking higher dosages but she is getting good response at this time. Restart Wellbutrin off label for ADD since can't get 2 ADDERALL daily. 150 mg daily then 300 mg daily. She can adjust the dose between 150 mg and 300 mg daily to get the optimal effect.   05/11/2020 appointment with the following noted: Has been inconsistent with Prozac and Wellbutrin. Not sure of the effect of Wellbutrin. Biggest deterrent in work is anxiety.  Some of  the work is conceptual and difficult at times.  Can feel she's not up to a project at times.  Overall is OK but would like a steadier benefit from stimulant.  Exhausted from managing concentration and keeping up with things from the day.  Loses things.  Not good keeping up with schedule. Overall productive and emotionally OK. Can feel Adderall wear off. Mood is better in summer and worse in the winter.   F died in October 11, 2023 and that is a loss. No SE Wellbutrin. Still attends AA meetings.  Real benefit from [place] last year. Recognizes effect of anemia on ADD and mood.  Had iron infusions last winter. Plan:  Wellbutrin off label for ADD since can't get 2 ADDERALL daily. 150 mg daily then 300 mg daily.  01/24/2021 appointment with following noted: Doing a program called [employer] mindfulness app since Xmas.  CBT app helped the depression.  App helped her focus better.  Lost sign weight. Writing a lot. Before Xmas felt depressed and started negative thinking worse, self denigrating. Not drinking. More isolated.   Recognizes mo is narcissist.    Didn't tell anyone she was born until 3 mos later.  M aloof and uninterested in pt.  Lied about her birthday.  Mo lack of affection even with pt's kids. Going to [place] for a year and it helped her to quit drinking. Also misses kids being gone with a hole  also. Plan: No med changes  05/04/2021 appointment with the following noted: Therapist [name] thinks she's manic. Lost weight to 144#.   States she is still sleeping okay.  Admits she is hyper and recognizes that she is likely manic.  She feels great, euphoric with an increased sense of spiritual connectedness to God.  She has racing thoughts and talks fast and talks a lot and this is noted by her husband.  He thinks she is a bit hyper.  She has been able to maintain sobriety although she will have 1 glass of wine on special occasions but does not drink by herself.  She is not drinking to excess.   She denies any dangerous impulsivity.  She is clearly not depressed and not particularly anxious.  She has no concerns about her medication and she has been compliant.  06/16/21 appt noted: So much better.  Going through a lot but the manic thing happened on top of it.  So much slower.  Didn't feel like losing anything with risperidone.  Likes the Adderalll at 10 mg. Some drowsiness in the AM and very drowsy from risperidone 2 mg HS. Prayer life is better. Handling stress better. Less depressed with risperidone. Still likes trazodone. Sleeps well. Plan: Reduce Prozac to 10 mg daily.  Consider stopping it because it can feel the mania however she is reluctant to do that because she fears relapse of depression. Reduce risperidone to 1.5 mg nightly due to side effects.  Discussed risk of worsening mania.  07/25/2021 appointment with the following noted: Misses the Adderall and hard to function without it. Depressed now. Heavy chest.  Anxious and guilty.  Body feels heavy.   Hates Wellbutrin.   Plan: Increase fluoxetine to 20 mg daily Add Abilify 1/2 of 15 mg tablet daily Wean wellbutrin by 1 tablet each week  bc she feels it is not helpful and DT polypharmacy Reduce risperidone to 1 daily for 1 week and stop it. Disc risk of mania. Increase Adderall to XR 20 mg AM  08/08/21 Much less depressed and starting to feel normal I feel a lot better. No SE.  Speech normal off risperidone. Sleeping OK on trazaodone and enough.   Noticed benefit from Adderall again. Plan: continue fluoxetine to 20 mg daily Continue Abilify 1/2 of 15 mg tablet daily for depression and mania continue Adderall to XR 20 mg AM  10/10/2021 phone call: Pt stated she feels like the Abilify should be decreased to 74m.She said she is depressed but rational and not suicidal.She has an appt Monday and can wait until then if you prefer. MD response: Reduce the Abilify to 7.5 mg every other day.  We will meet on 10/16/2021 and decide  what to do from there.  10/16/2021 appointment with the following noted: More depressed.  Most depressed I've ever been.  Just numb.  Sense of grief.   Thinks the manic episode was unlike anything else she ever had.  Doesn't want to medicate against it.  Don't enjoy people.  Easily overwhelmed.  Had some death thoughts but not suicidal.  Has been functional.  Feels better today after reducing Abilify to every other day but she is only been doing that for 3 days. A/P: Episode of post manic depression was explained. continue fluoxetine to 20 mg daily Hold Abilify for 1 week then resume Abilify 1/2 of 15 mg tablet every other day for depression and mania continue Adderall to XR 20 mg AM  10/27/2021 appointment with the  following noted: I'm doing so much better.  Handling the depression better. Better self talk and spiritual focus has helped.   Dep 6/10 manifesting as anxiety with low confidence.   F died 2  years ago and M 66 yo and is dependent . She is working hard to feel better but still feels depressed.  She almost feels like she has a little more anxiety since restarting Abilify every other day. Plan: continue fluoxetine to 20 mg daily DC Abilify .  Vrayalar 1.5 mg QOD to try to get rid of depression ASAP. continue Adderall to XR 20 mg AM  11/10/2021 appointment with the following noted: Busy with Xmas and it was fun with family but then a big let down.  Did well with it.  Functioned well with it.  Working hard on things with depression.  Not shutting down. Not sure but feels better today but yesterday was hard.  Difficulty dealing with mother.  She won't do anything to help herself.  Yesterday with her all day.  Won't do PT and has isolated herself.    Lack of confidence.   No SE with Vraylar.  11/24/21 urgent appointment appt noted: More and more depressed.   So anxious and doesn't want to be alone but can do so. No appetite. Hurts inside. Has had some fleeting suicidal thoughts but  would not act on them.  Tolerating meds. Has been consistent with Vraylar 1.5 mg every other day, fluoxetine 20 mg daily Plan: Increase Vraylar to 1.5 mg daily Change Prozac to Trintellix 10 mg daily. Discussed side effects of each continue Adderall to XR 20 mg AM  12/27/2021 appointment with the following noted: Not OK.  I feel less depressed but feels bat shit. Not sleeping well.  Extremely anxious. Off and on sleep. 3-4 hours of sleep.   Still having daily SI.  But also become obvious has so much to do.  Overwhelmed by tasks.   Needs anxiety meds to just function. Not more motivated.  Walked yesterday.   Feels afraid like in trouble but not irritable or angry. DC DT agitation Vraylar to 1.5 mg daily Change Prozac to Trintellix 10 mg daily. Hold Adderall to XR 20 mg AM Clonidine 0.1 1/2 tablet twice daily for 2 days and if needed for anxiety and sleep increase to 1 twice daily Ok temporary Ativan 1 mg 3 times daily as needed anxiety  01/05/22 appt noted: Off fluoxetine and  Trintellix.  Only on Ativan, trazodone and Adderall XR 20 plus added clonidine 0.1 mg BID Didn't think she needed to start Trintellix. Not taking Ativan.   Didn't like herself last week. Feels some better today. Wonders if the manic sx Not agitated.  Anxiety kind of calmed down.  A lot to be anxious about situationally.  $ stress. Concerns about downers with meds. Can't access normal personality. ? Lethargy and inability to talk as sE. Plan: Latuda 20-40 mg daily with food. Adderall to XR 20 mg AM Clonidine 0.1 1/2 tablet twice daily  reduce dose to be sure no SE Ok temporary Ativan 1 mg 3 times daily as needed anxiety  01/19/22 appt noted: Taking Latuda 20 mg daily.  Took 40 mg once and felt anxious and  SI Still depressed and not very reactive Anxiety mainly about the depression and fears of the future. She wants to revisit manic sx and thinks it was maybe bc taking delta 8 bc was taking a lot of it so  still doesn't think she's  classic bipolar. She wants to only take Prozac bc thinks Latuda is perpetuating depression. Says the delta 8 was very psychaedelic.  When not taking it was not manic.  Sleeping ok again.  Plan: Per her request DC Latuda 20-40 mg daily with food. She wants to continue Prozac alone AMA  Adderall to XR 20 mg AM Clonidine 0.1 1/2 tablet twice daily  reduce dose to be sure no SE Ok temporary Ativan 1 mg 3 times daily as needed anxiety  01/23/2022 phone call complaining of increased anxiety since stopping Latuda.  She will try increasing clonidine.  01/26/2022 phone call not feeling well and wanted to restart the Vraylar.  However notes indicate that had made her agitated therefore she was encouraged to pick up samples of Rexulti 1 mg and start that instead.  02/06/2022 phone call: Stating she felt the Rexulti was helping with depression but she was not sleeping well and obsessing over things.  She was encouraged to increase Rexulti to 2 mg daily and increase trazodone for sleep.  02/09/2022 appointment with the following noted: This was an urgent work in appointment No sleep last night with trazodone 100 mg HS Nothing really better depression or anxiety. Ruminating negative anxious thoughts. Did not tolerate Rexulti because it was causing insomnia.  Does not think it helped depression.  Lacks emotion that she should have.  Lacks her usual personality.  Some hopeless thoughts.  Some death thoughts.  Some suicidal thoughts without plan or intent Plan: DC Rexulti and Prozac & DC trazodone Adderall to XR 20 mg AM Clonidine 0.1 1/tablet twice daily  reduce dose to be sure no SE Ok temporary Ativan 1 mg 3 times daily as needed anxiety Start Seroquel XR 150 mg nightly  03/02/2022 appointment: Langley Gauss called back a few days after starting Seroquel stating it was making her more anxious and more depressed.  This seemed unlikely as this medicine rarely ever causes anxiety.  She  stopped the medication waited 3 days and called back still had anxiety and depression but thought perhaps the anxiety was a little better.  She did not want to take the Seroquel. She knew about the option of Spravato and wanted to pursue that. Now questions whether to return to Seroquel while waiting to start Spravato bc feels just as bad without it and knows she didn't give it enough time to work.   MADRS 46  ECT-MADRS    Flowsheet Row Office Visit from 03/02/2022 in [facility]  MADRS Total Score 46      03/14/22 appt noted: Pt received Spravato 56 mg first dose today with some dissociative sx which were not severe.  She was anxious prior to the administration and felt better after receiving lorazepam 1 mg.  No NV, or HA. Wants to continue Spravato. Ongoing depression and desperate to feel better.  I'm not myself DT deprsssion which is most severe in recent history.  Anhedonia.  Low motivation.  Social avoidance. Continues to think all recent med trials are making her worse.  Sleep ok with Seroquel.  03/16/22 appt noted: Received Spravato 84 mg for the first time.  some dissociative sx which were not severe.  She was anxious prior to the administration and felt better after receiving lorazepam 1 mg.  No NV, or HA. Wants to continue Spravato.   Does not feel any better or different since the last appt.  Ongoing depression.  Ongoing depression and desperate to feel better.  I'm not myself DT deprsssion  which is most severe in recent history.  Anhedonia.  Low motivation.  Social avoidance. Continues to think all recent med trials are making her worse.  Sleep ok with Seroquel.  Does not want to continue Seroquel for TRD.  03/20/2022 appointment noted: Came for Spravato administration today.  However blood pressure was significantly elevated approximately 180/115.  She was given lorazepam 1 mg and clonidine 0.2 mg to try to get it down. She states she regretted stopping the Seroquel  XR 300 mg tablets.  She now realizes it was helpful.  She did not sleep much at all last night.  She did not take the Adderall this morning. 2 to 3 hours after arrival blood pressure was still elevated at  170/110, 62 pulse.  For Spravato administration was canceled for today.  She admits to being anxious and depressed.  She is not suicidal.  She is highly motivated to receive the Spravato.  We discussed getting it tomorrow.  03/22/2022 appointment noted: Patient's blood pressure was never stable enough yesterday in order to get her in for Spravato administration.  She was encouraged to see her primary care doctor.  It is better today.   D Middie most like her.  Past Psychiatric Medication Trials: fluoxetine, duloxetine, Viibryd, lamotrigine, Pristiq, sertraline, citalopram,  Trintellix anxious and SI Adderall, Adderall XR, Vyvanse, Ritalin, Strattera low dose NR Lorazepam Trazodone Wellbutrin  Depakote,  lamotrigine cog complaints Lithium remotely Abilify 7.5  Vraylar 1.5 mg daily agitation and insomnia Rexulti insomnia Seroquel XR briefly  At visit November 12, 2019. We discussed Patient developed an increasingly severe alcohol dependence problem since her last visit in January.  She went to [facility] and has had no alcohol since then except 1 day.  She never abused stimulants but they took her off the stimulants at [facility].  Her ADD was markedly worse.  The Wellbutrin did not helped the ADD.   D history lamotrigine rash at 66 yo  Review of Systems:  Review of Systems  Constitutional:  Positive for fatigue.  Respiratory:  Negative for shortness of breath.   Cardiovascular:  Negative for chest pain and palpitations.  Musculoskeletal:  Positive for arthralgias, back pain and joint swelling.       SP hip surgery October 2020  Neurological:  Negative for dizziness and tremors.  Psychiatric/Behavioral:  Positive for dysphoric mood, sleep disturbance and suicidal ideas.  Negative for agitation, behavioral problems, confusion, decreased concentration, hallucinations and self-injury. The patient is nervous/anxious. The patient is not hyperactive.    Medications: I have reviewed the patient's current medications.  Current Outpatient Medications  Medication Sig Dispense Refill   amphetamine-dextroamphetamine (ADDERALL XR) 20 MG 24 hr capsule Take 1 capsule (20 mg total) by mouth every morning. 30 capsule 0   cloNIDine (CATAPRES) 0.1 MG tablet TAKE 1 TABLET BY MOUTH TWICE A DAY 60 tablet 0   cloNIDine (CATAPRES) 0.2 MG tablet Take 1 tablet (0.2 mg total) by mouth 2 (two) times daily. 60 tablet 0   Esketamine HCl, 84 MG Dose, (SPRAVATO, 84 MG DOSE,) 28 MG/DEVICE SOPK Administer 84 mg ( 3 of the 28 mg devices) intranasally as directed twice a week 3 each 5   iron polysaccharides (NIFEREX) 150 MG capsule TAKE 1 CAPSULE BY MOUTH EVERY DAY 30 capsule 1   LORazepam (ATIVAN) 1 MG tablet Take 1 tablet (1 mg total) by mouth every 8 (eight) hours. 90 tablet 0   nebivolol (BYSTOLIC) 2.5 MG tablet Take 2.5 mg by mouth daily.  QUEtiapine (SEROQUEL XR) 300 MG 24 hr tablet Take 1 tablet (300 mg total) by mouth at bedtime. 30 tablet 0   No current facility-administered medications for this visit.    Medication Side Effects: None  Allergies:  Allergies  Allergen Reactions   Metronidazole Shortness Of Breath and Other (See Comments)    Heart pounding   Ferrlecit [Na Ferric Gluc Cplx In Sucrose] Other (See Comments)    Infusion reaction 05/12/2019    Past Medical History:  Diagnosis Date   ADHD    Anemia    Anxiety    Arthritis    Depression    Heart murmur    i went to see a cardiologit slast eyar  and i had zero plaque,    PONV (postoperative nausea and vomiting)    Recovering alcoholic in remission (HCC)     Family History  Problem Relation Age of Onset   Atrial fibrillation Mother    CAD Father     Social History   Socioeconomic History   Marital  status: Married    Spouse name: Not on file   Number of children: Not on file   Years of education: Not on file   Highest education level: Not on file  Occupational History   Not on file  Tobacco Use   Smoking status: Former    Types: Cigarettes    Quit date: 08/16/2003    Years since quitting: 18.6   Smokeless tobacco: Never   Tobacco comments:     08-28-2019 "i smoked 2 cigarettes in the last month since my father  passed"  Vaping Use   Vaping Use: Never used  Substance and Sexual Activity   Alcohol use: Yes    Alcohol/week: 10.0 standard drinks    Types: 10 Glasses of wine per week   Drug use: No   Sexual activity: Not on file  Other Topics Concern   Not on file  Social History Narrative   Not on file   Social Determinants of Health   Financial Resource Strain: Not on file  Food Insecurity: Not on file  Transportation Needs: Not on file  Physical Activity: Not on file  Stress: Not on file  Social Connections: Not on file  Intimate Partner Violence: Not on file    Past Medical History, Surgical history, Social history, and Family history were reviewed and updated as appropriate.   Please see review of systems for further details on the patient's review from today.   Objective:   Physical Exam:  There were no vitals taken for this visit.  Physical Exam Constitutional:      General: She is not in acute distress. Neurological:     Mental Status: She is alert and oriented to person, place, and time.     Coordination: Coordination normal.     Gait: Gait normal.  Psychiatric:        Attention and Perception: Perception normal. She is attentive.        Mood and Affect: Mood is anxious and depressed. Affect is blunt. Affect is not labile, angry, tearful or inappropriate.        Speech: Speech is not rapid and pressured, slurred or tangential.        Behavior: Behavior is not agitated, slowed or aggressive.        Thought Content: Thought content is not paranoid or  delusional. Thought content does not include homicidal or suicidal ideation. Thought content does not include suicidal plan.  Cognition and Memory: Cognition normal.        Judgment: Judgment normal.     Comments: Insight intact. No auditory or visual hallucinations. No delusions.  Depression i ongoing. Anxious and ambivalent about medicines.  Some feelings of hopelessness No Sui intent plan     Lab Review:     Component Value Date/Time   NA 137 01/12/2021 1430   NA 140 11/18/2018 1544   K 3.8 01/12/2021 1430   CL 108 01/12/2021 1430   CO2 22 01/12/2021 1430   GLUCOSE 94 01/12/2021 1430   BUN 14 01/12/2021 1430   BUN 20 11/18/2018 1544   CREATININE 0.82 01/12/2021 1430   CALCIUM 8.9 01/12/2021 1430   PROT 6.6 01/12/2021 1430   ALBUMIN 3.9 01/12/2021 1430   AST 12 (L) 01/12/2021 1430   ALT 11 01/12/2021 1430   ALKPHOS 46 01/12/2021 1430   BILITOT 0.5 01/12/2021 1430   GFRNONAA >60 01/12/2021 1430   GFRAA >60 09/02/2019 0249   GFRAA >60 01/27/2019 0811       Component Value Date/Time   WBC 4.5 01/12/2021 1430   RBC 4.32 01/12/2021 1430   HGB 12.8 01/12/2021 1430   HGB 12.9 07/17/2019 0953   HCT 38.5 01/12/2021 1430   HCT 21.9 (L) 12/25/2018 1221   PLT 272 01/12/2021 1430   PLT 286 07/17/2019 0953   MCV 89.1 01/12/2021 1430   MCH 29.6 01/12/2021 1430   MCHC 33.2 01/12/2021 1430   RDW 12.4 01/12/2021 1430   LYMPHSABS 1.4 01/12/2021 1430   MONOABS 0.4 01/12/2021 1430   EOSABS 0.0 01/12/2021 1430   BASOSABS 0.0 01/12/2021 1430    No results found for: POCLITH, LITHIUM   No results found for: PHENYTOIN, PHENOBARB, VALPROATE, CBMZ   .res Assessment: Plan:    Recurrent major depression resistant to treatment (HCC)  Generalized anxiety disorder  Insomnia due to mental condition  Attention deficit hyperactivity disorder (ADHD), predominantly inattentive type  Accelerated hypertension   Greater than 50% of 30 min face to face time with patient was  spent on counseling and coordination of care.  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  We discussed some of her recent abnormal behaviors leading to this depressive episode getting worse which she says were associated with heavy use of delta 8 and not a manic episode.  She realizes now that that was not good for her.  She stopped all use of other drugs including those available over-the-counter such as delta 8 or any other THC related products.  She is no longer having any of those types of behaviors and instead is depressed. She remains persistently depressed with lack of interest and lack of feeling for things that normally she would have feelings about.  She has low motivation and energy.  She is sad and down.  She is less productive than usual.  Her concentration is poor.  She has high degree of anxiety as well.  Failed multiple antidepressants.  She wants to pursue Spravato.  We discussed discussed the side effects in detail as well as the protocol required to receive Spravato.  S The protocol requires use of a antidepressant along with Spravato   Could not receive Spravato today because blood pressure was too high.  She will increase the clonidine to 0.2 mg twice daily.  If it remains persistently high she will need to see her primary care doctor.  We will attempt to administer Spravato tomorrow  Unfortunately she remains depressed.  Discussed  alternatives.  Adderall to XR 20 mg AM Clonidine increased to 0.2 mg twice daily. She agrees to restart Seroquel XR 300 mg nightly for treatment resistant depression as well as sleep and anxiety. Ok temporary Ativan 1 mg 3 times daily as needed anxiety Per her request ok to stop Seroquel bc she is unlikely to give it a chance to help her.  It might work if given more time.  Discussed potential benefits, risks, and side effects of stimulants with patient to include increased heart rate, palpitations, insomnia, increased anxiety,  increased irritability, or decreased appetite.  Instructed patient to contact office if experiencing any significant tolerability issues. She wants to return to usual dose of Adderall for ADD bc of mor poor cognitive function with reduction.  Also discussed that depression will impair cognitive function.  Discussed safety plan at length with patient.  Advised patient to contact office with any worsening signs and symptoms.  Instructed patient to go to the [facility] emergency room for evaluation if experiencing any acute safety concerns, to include suicidal intent.  Has Maintained sobriety  FU with Spravato ASAP  Meredith Staggers, MD, DFAPA     Please see After Visit Summary for patient specific instructions.  No future appointments.       No orders of the defined types were placed in this encounter.      -------------------------------

## 2022-03-26 ENCOUNTER — Encounter: Payer: Self-pay | Admitting: Psychiatry

## 2022-03-26 ENCOUNTER — Ambulatory Visit: Payer: 59

## 2022-03-26 ENCOUNTER — Ambulatory Visit (INDEPENDENT_AMBULATORY_CARE_PROVIDER_SITE_OTHER): Payer: 59 | Admitting: Psychiatry

## 2022-03-26 VITALS — BP 127/78 | HR 55

## 2022-03-26 DIAGNOSIS — F5105 Insomnia due to other mental disorder: Secondary | ICD-10-CM

## 2022-03-26 DIAGNOSIS — F3130 Bipolar disorder, current episode depressed, mild or moderate severity, unspecified: Secondary | ICD-10-CM

## 2022-03-26 DIAGNOSIS — F900 Attention-deficit hyperactivity disorder, predominantly inattentive type: Secondary | ICD-10-CM | POA: Diagnosis not present

## 2022-03-26 DIAGNOSIS — I10 Essential (primary) hypertension: Secondary | ICD-10-CM

## 2022-03-26 DIAGNOSIS — F339 Major depressive disorder, recurrent, unspecified: Secondary | ICD-10-CM | POA: Diagnosis not present

## 2022-03-26 DIAGNOSIS — F411 Generalized anxiety disorder: Secondary | ICD-10-CM

## 2022-03-26 MED ORDER — LORAZEPAM 1 MG PO TABS: 1.0000 mg | ORAL_TABLET | Freq: Three times a day (TID) | ORAL | 0 refills | Status: DC

## 2022-03-26 NOTE — Progress Notes (Addendum)
MORNA FLUD [id number] [birth year] 66 y.o.  Subjective:   Patient ID:  Laura Chang is a 66 y.o. (DOB 1956/06/07) female.  Chief Complaint:  Chief Complaint  Patient presents with   Follow-up   Depression   Anxiety     HPI Laura Chang presents to the office today for follow-up of depression and anxiety and ADD.  seen November 12, 2019.  Melted down in 2020.  Went to [employer] in July.  No withdrawal.  1 drink since then.  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 She was on no stimulant and no SSRI but was taking Strattera and Wellbutrin.  The following changes were made. Stop Strattera. OK restart stimulant bc severe ADD Restart Adderall 1 daily for a few days and if tolerated then restart 1 twice daily. If not tolerated reduce the dosage if needed. May need to stop Wellbutrin if not tolerating the stimulant.  Yes.  DC Wellbutrin Restart Prozac 20 mg daily.  February 2021 appointment with the following noted: Completed grant proposal.  Couldn't doit without Adderall.  Sold a bunch of work.   Adderall XR lasts about 3 pm.  Strength seems about right.  BP been OK.  Not jittery.   Stopped Wellbutrin but had no SE. Mood drastically better with grant proposal and back on fluoxetine.  Less depressed and lethargic.  No anxiety.  Cut back on coffee. Started back with devotions and stronger faith. Plan: Continue Prozac 20 mg daily. May have to increase the dose at some point in the future given that she usually was taking higher dosages but she is getting good response at this time. Restart Wellbutrin off label for ADD since can't get 2 ADDERALL daily. 150 mg daily then 300 mg daily. She can adjust the dose between 150 mg and 300 mg daily to get the optimal effect.   05/11/2020 appointment with the following noted: Has been inconsistent with Prozac and Wellbutrin. Not sure of the effect of Wellbutrin. Biggest deterrent in work is anxiety.  Some of  the work is conceptual and difficult at times.  Can feel she's not up to a project at times.  Overall is OK but would like a steadier benefit from stimulant.  Exhausted from managing concentration and keeping up with things from the day.  Loses things.  Not good keeping up with schedule. Overall productive and emotionally OK. Can feel Adderall wear off. Mood is better in summer and worse in the winter.   F died in October 11, 2023 and that is a loss. No SE Wellbutrin. Still attends AA meetings.  Real benefit from [place] last year. Recognizes effect of anemia on ADD and mood.  Had iron infusions last winter. Plan:  Wellbutrin off label for ADD since can't get 2 ADDERALL daily. 150 mg daily then 300 mg daily.  01/24/2021 appointment with following noted: Doing a program called [employer] mindfulness app since Xmas.  CBT app helped the depression.  App helped her focus better.  Lost sign weight. Writing a lot. Before Xmas felt depressed and started negative thinking worse, self denigrating. Not drinking. More isolated.   Recognizes mo is narcissist.    Didn't tell anyone she was born until 3 mos later.  M aloof and uninterested in pt.  Lied about her birthday.  Mo lack of affection even with pt's kids. Going to [place] for a year and it helped her to quit drinking. Also misses kids being gone with a hole  also. Plan: No med changes  05/04/2021 appointment with the following noted: Therapist [name] thinks she's manic. Lost weight to 144#.   States she is still sleeping okay.  Admits she is hyper and recognizes that she is likely manic.  She feels great, euphoric with an increased sense of spiritual connectedness to God.  She has racing thoughts and talks fast and talks a lot and this is noted by her husband.  He thinks she is a bit hyper.  She has been able to maintain sobriety although she will have 1 glass of wine on special occasions but does not drink by herself.  She is not drinking to excess.   She denies any dangerous impulsivity.  She is clearly not depressed and not particularly anxious.  She has no concerns about her medication and she has been compliant.  06/16/21 appt noted: So much better.  Going through a lot but the manic thing happened on top of it.  So much slower.  Didn't feel like losing anything with risperidone.  Likes the Adderalll at 10 mg. Some drowsiness in the AM and very drowsy from risperidone 2 mg HS. Prayer life is better. Handling stress better. Less depressed with risperidone. Still likes trazodone. Sleeps well. Plan: Reduce Prozac to 10 mg daily.  Consider stopping it because it can feel the mania however she is reluctant to do that because she fears relapse of depression. Reduce risperidone to 1.5 mg nightly due to side effects.  Discussed risk of worsening mania.  07/25/2021 appointment with the following noted: Misses the Adderall and hard to function without it. Depressed now. Heavy chest.  Anxious and guilty.  Body feels heavy.   Hates Wellbutrin.   Plan: Increase fluoxetine to 20 mg daily Add Abilify 1/2 of 15 mg tablet daily Wean wellbutrin by 1 tablet each week  bc she feels it is not helpful and DT polypharmacy Reduce risperidone to 1 daily for 1 week and stop it. Disc risk of mania. Increase Adderall to XR 20 mg AM  08/08/21 Much less depressed and starting to feel normal I feel a lot better. No SE.  Speech normal off risperidone. Sleeping OK on trazaodone and enough.   Noticed benefit from Adderall again. Plan: continue fluoxetine to 20 mg daily Continue Abilify 1/2 of 15 mg tablet daily for depression and mania continue Adderall to XR 20 mg AM  10/10/2021 phone call: Pt stated she feels like the Abilify should be decreased to 30m.She said she is depressed but rational and not suicidal.She has an appt Monday and can wait until then if you prefer. MD response: Reduce the Abilify to 7.5 mg every other day.  We will meet on 10/16/2021 and decide  what to do from there.  10/16/2021 appointment with the following noted: More depressed.  Most depressed I've ever been.  Just numb.  Sense of grief.   Thinks the manic episode was unlike anything else she ever had.  Doesn't want to medicate against it.  Don't enjoy people.  Easily overwhelmed.  Had some death thoughts but not suicidal.  Has been functional.  Feels better today after reducing Abilify to every other day but she is only been doing that for 3 days. A/P: Episode of post manic depression was explained. continue fluoxetine to 20 mg daily Hold Abilify for 1 week then resume Abilify 1/2 of 15 mg tablet every other day for depression and mania continue Adderall to XR 20 mg AM  10/27/2021 appointment with the  following noted: I'm doing so much better.  Handling the depression better. Better self talk and spiritual focus has helped.   Dep 6/10 manifesting as anxiety with low confidence.   F died 2  years ago and M 66 yo and is dependent . She is working hard to feel better but still feels depressed.  She almost feels like she has a Laura more anxiety since restarting Abilify every other day. Plan: continue fluoxetine to 20 mg daily DC Abilify .  Vrayalar 1.5 mg QOD to try to get rid of depression ASAP. continue Adderall to XR 20 mg AM  11/10/2021 appointment with the following noted: Busy with Xmas and it was fun with family but then a big let down.  Did well with it.  Functioned well with it.  Working hard on things with depression.  Not shutting down. Not sure but feels better today but yesterday was hard.  Difficulty dealing with mother.  She won't do anything to help herself.  Yesterday with her all day.  Won't do PT and has isolated herself.    Lack of confidence.   No SE with Vraylar.  11/24/21 urgent appointment appt noted: More and more depressed.   So anxious and doesn't want to be alone but can do so. No appetite. Hurts inside. Has had some fleeting suicidal thoughts but  would not act on them.  Tolerating meds. Has been consistent with Vraylar 1.5 mg every other day, fluoxetine 20 mg daily Plan: Increase Vraylar to 1.5 mg daily Change Prozac to Trintellix 10 mg daily. Discussed side effects of each continue Adderall to XR 20 mg AM  12/27/2021 appointment with the following noted: Not OK.  I feel less depressed but feels bat shit. Not sleeping well.  Extremely anxious. Off and on sleep. 3-4 hours of sleep.   Still having daily SI.  But also become obvious has so much to do.  Overwhelmed by tasks.   Needs anxiety meds to just function. Not more motivated.  Walked yesterday.   Feels afraid like in trouble but not irritable or angry. DC DT agitation Vraylar to 1.5 mg daily Change Prozac to Trintellix 10 mg daily. Hold Adderall to XR 20 mg AM Clonidine 0.1 1/2 tablet twice daily for 2 days and if needed for anxiety and sleep increase to 1 twice daily Ok temporary Ativan 1 mg 3 times daily as needed anxiety  01/05/22 appt noted: Off fluoxetine and  Trintellix.  Only on Ativan, trazodone and Adderall XR 20 plus added clonidine 0.1 mg BID Didn't think she needed to start Trintellix. Not taking Ativan.   Didn't like herself last week. Feels some better today. Wonders if the manic sx Not agitated.  Anxiety kind of calmed down.  A lot to be anxious about situationally.  $ stress. Concerns about downers with meds. Can't access normal personality. ? Lethargy and inability to talk as sE. Plan: Latuda 20-40 mg daily with food. Adderall to XR 20 mg AM Clonidine 0.1 1/2 tablet twice daily  reduce dose to be sure no SE Ok temporary Ativan 1 mg 3 times daily as needed anxiety  01/19/22 appt noted: Taking Latuda 20 mg daily.  Took 40 mg once and felt anxious and  SI Still depressed and not very reactive Anxiety mainly about the depression and fears of the future. She wants to revisit manic sx and thinks it was maybe bc taking delta 8 bc was taking a lot of it so  still doesn't think she's  classic bipolar. She wants to only take Prozac bc thinks Latuda is perpetuating depression. Says the delta 8 was very psychaedelic.  When not taking it was not manic.  Sleeping ok again.  Plan: Per her request DC Latuda 20-40 mg daily with food. She wants to continue Prozac alone AMA  Adderall to XR 20 mg AM Clonidine 0.1 1/2 tablet twice daily  reduce dose to be sure no SE Ok temporary Ativan 1 mg 3 times daily as needed anxiety  01/23/2022 phone call complaining of increased anxiety since stopping Latuda.  She will try increasing clonidine.  01/26/2022 phone call not feeling well and wanted to restart the Vraylar.  However notes indicate that had made her agitated therefore she was encouraged to pick up samples of Rexulti 1 mg and start that instead.  02/06/2022 phone call: Stating she felt the Rexulti was helping with depression but she was not sleeping well and obsessing over things.  She was encouraged to increase Rexulti to 2 mg daily and increase trazodone for sleep.  02/09/2022 appointment with the following noted: This was an urgent work in appointment No sleep last night with trazodone 100 mg HS Nothing really better depression or anxiety. Ruminating negative anxious thoughts. Did not tolerate Rexulti because it was causing insomnia.  Does not think it helped depression.  Lacks emotion that she should have.  Lacks her usual personality.  Some hopeless thoughts.  Some death thoughts.  Some suicidal thoughts without plan or intent Plan: DC Rexulti and Prozac & DC trazodone Adderall to XR 20 mg AM Clonidine 0.1 1/tablet twice daily  reduce dose to be sure no SE Ok temporary Ativan 1 mg 3 times daily as needed anxiety Start Seroquel XR 150 mg nightly  03/02/2022 appointment: Laura Chang called back a few days after starting Seroquel stating it was making her more anxious and more depressed.  This seemed unlikely as this medicine rarely ever causes anxiety.  She  stopped the medication waited 3 days and called back still had anxiety and depression but thought perhaps the anxiety was a Laura better.  She did not want to take the Seroquel. She knew about the option of Spravato and wanted to pursue that. Now questions whether to return to Seroquel while waiting to start Spravato bc feels just as bad without it and knows she didn't give it enough time to work.   MADRS 46  ECT-MADRS    Flowsheet Row Office Visit from 03/02/2022 in [facility]  MADRS Total Score 46      03/14/22 appt noted: Pt received Spravato 56 mg first dose today with some dissociative sx which were not severe.  She was anxious prior to the administration and felt better after receiving lorazepam 1 mg.  No NV, or HA. Wants to continue Spravato. Ongoing depression and desperate to feel better.  I'm not myself DT deprsssion which is most severe in recent history.  Anhedonia.  Low motivation.  Social avoidance. Continues to think all recent med trials are making her worse.  Sleep ok with Seroquel.  03/16/22 appt noted: Received Spravato 84 mg for the first time.  some dissociative sx which were not severe.  She was anxious prior to the administration and felt better after receiving lorazepam 1 mg.  No NV, or HA. Wants to continue Spravato.   Does not feel any better or different since the last appt.  Ongoing depression.  Ongoing depression and desperate to feel better.  I'm not myself DT deprsssion  which is most severe in recent history.  Anhedonia.  Low motivation.  Social avoidance. Continues to think all recent med trials are making her worse.  Sleep ok with Seroquel.  Does not want to continue Seroquel for TRD.  03/20/2022 appointment noted: Came for Spravato administration today.  However blood pressure was significantly elevated approximately 180/115.  She was given lorazepam 1 mg and clonidine 0.2 mg to try to get it down. She states she regretted stopping the Seroquel  XR 300 mg tablets.  She now realizes it was helpful.  She did not sleep much at all last night.  She did not take the Adderall this morning. 2 to 3 hours after arrival blood pressure was still elevated at  170/110, 62 pulse.  For Spravato administration was canceled for today.  She admits to being anxious and depressed.  She is not suicidal.  She is highly motivated to receive the Spravato.  We discussed getting it tomorrow.  03/22/2022 appointment noted: Patient's blood pressure was never stable enough yesterday in order to get her in for Spravato administration.  She was encouraged to see her primary care doctor.  It is better today.  03/26/2022 appointment with the following noted: Blood pressure was better.  Saw her primary care doctor who started on oral Bystolic 2.5 mg daily. Received Spravato 84 mg today as scheduled.  Tolerated it well without nausea or vomiting headache or chest pain or palpitations.  Her blood pressure was borderline but manageable. She remains depressed and anxious.  She is ambivalent about the medicine and desperate to get to feel better.  Continues to have anhedonia and low energy and low motivation and reduced ability to do things.  Less social.  Not suicidal.   D Middie most like her.  Past Psychiatric Medication Trials: fluoxetine, duloxetine, Viibryd, lamotrigine, Pristiq, sertraline, citalopram,  Trintellix anxious and SI Adderall, Adderall XR, Vyvanse, Ritalin, Strattera low dose NR Lorazepam Trazodone Wellbutrin  Depakote,  lamotrigine cog complaints Lithium remotely Abilify 7.5  Vraylar 1.5 mg daily agitation and insomnia Rexulti insomnia Seroquel XR briefly  At visit November 12, 2019. We discussed Patient developed an increasingly severe alcohol dependence problem since her last visit in January.  She went to [employer] [facility] and has had no alcohol since then except 1 day.  She never abused stimulants but they took her off the stimulants at [facility].  Her ADD was markedly worse.  The Wellbutrin did not helped the ADD.   D history lamotrigine rash at 66 yo  Review of Systems:  Review of Systems  Constitutional:  Positive for fatigue.  Respiratory:  Negative for shortness of breath.   Cardiovascular:  Negative for chest pain and palpitations.  Musculoskeletal:  Positive for arthralgias, back pain and joint swelling.       SP hip surgery October 2020  Neurological:  Negative for dizziness and tremors.  Psychiatric/Behavioral:  Positive for dysphoric mood, sleep disturbance and suicidal ideas. Negative for agitation, behavioral problems, confusion, decreased concentration, hallucinations and self-injury. The patient is nervous/anxious. The patient is not hyperactive.    Medications: I have reviewed the patient's current medications.  Current Outpatient Medications  Medication Sig Dispense Refill   amphetamine-dextroamphetamine (ADDERALL XR) 20 MG 24 hr capsule Take 1 capsule (20 mg total) by mouth every morning. 30 capsule 0   cloNIDine (CATAPRES) 0.2 MG tablet Take 1 tablet (0.2 mg total) by mouth 2 (two) times daily. 60 tablet 0   Esketamine HCl, 84 MG Dose, (SPRAVATO,  84 MG DOSE,) 28 MG/DEVICE SOPK Administer 84 mg ( 3 of the 28 mg devices) intranasally as directed twice a week 3 each 5   iron polysaccharides (NIFEREX) 150 MG capsule TAKE 1 CAPSULE BY MOUTH EVERY DAY 30 capsule 1   nebivolol (BYSTOLIC) 2.5 MG tablet Take 2.5 mg by mouth daily.     QUEtiapine (SEROQUEL XR) 300 MG 24 hr tablet Take 1 tablet (300 mg total) by mouth at bedtime. 30 tablet 0   cloNIDine (CATAPRES) 0.1 MG tablet TAKE 1 TABLET BY MOUTH TWICE A DAY 60 tablet 0   LORazepam (ATIVAN) 1 MG tablet Take 1 tablet (1 mg total) by mouth every 8 (eight) hours. 90 tablet 0   No current facility-administered medications for this visit.    Medication Side Effects: None  Allergies:  Allergies  Allergen Reactions   Metronidazole Shortness Of Breath and Other (See  Comments)    Heart pounding   Ferrlecit [Na Ferric Gluc Cplx In Sucrose] Other (See Comments)    Infusion reaction 05/12/2019    Past Medical History:  Diagnosis Date   ADHD    Anemia    Anxiety    Arthritis    Depression    Heart murmur    i went to see a cardiologit slast eyar  and i had zero plaque,    PONV (postoperative nausea and vomiting)    Recovering alcoholic in remission ([place])     Family History  Problem Relation Age of Onset   Atrial fibrillation Mother    CAD Father     Social History   Socioeconomic History   Marital status: Married    Spouse name: Not on file   Number of children: Not on file   Years of education: Not on file   Highest education level: Not on file  Occupational History   Not on file  Tobacco Use   Smoking status: Former    Types: Cigarettes    Quit date: 08/16/2003    Years since quitting: 18.6   Smokeless tobacco: Never   Tobacco comments:     08-28-2019 "i smoked 2 cigarettes in the last month since my father  passed"  Vaping Use   Vaping Use: Never used  Substance and Sexual Activity   Alcohol use: Yes    Alcohol/week: 10.0 standard drinks    Types: 10 Glasses of wine per week   Drug use: No   Sexual activity: Not on file  Other Topics Concern   Not on file  Social History Narrative   Not on file   Social Determinants of Health   Financial Resource Strain: Not on file  Food Insecurity: Not on file  Transportation Needs: Not on file  Physical Activity: Not on file  Stress: Not on file  Social Connections: Not on file  Intimate Partner Violence: Not on file    Past Medical History, Surgical history, Social history, and Family history were reviewed and updated as appropriate.   Please see review of systems for further details on the patient's review from today.   Objective:   Physical Exam:  There were no vitals taken for this visit.  Physical Exam Constitutional:      General: She is not in acute  distress. Neurological:     Mental Status: She is alert and oriented to person, place, and time.     Coordination: Coordination normal.     Gait: Gait normal.  Psychiatric:        Attention and  Perception: Perception normal. She is attentive.        Mood and Affect: Mood is anxious and depressed. Affect is blunt. Affect is not labile, angry, tearful or inappropriate.        Speech: Speech is not rapid and pressured, slurred or tangential.        Behavior: Behavior is not agitated, slowed or aggressive.        Thought Content: Thought content is not paranoid. Thought content does not include homicidal or suicidal ideation. Thought content does not include suicidal plan.        Cognition and Memory: Cognition normal.        Judgment: Judgment normal.     Comments: Insight intact. No auditory or visual hallucinations. No delusions.  Depression i ongoing. More anxious  No Sui intent plan     Lab Review:     Component Value Date/Time   NA 137 01/12/2021 1430   NA 140 11/18/2018 1544   K 3.8 01/12/2021 1430   CL 108 01/12/2021 1430   CO2 22 01/12/2021 1430   GLUCOSE 94 01/12/2021 1430   BUN 14 01/12/2021 1430   BUN 20 11/18/2018 1544   CREATININE 0.82 01/12/2021 1430   CALCIUM 8.9 01/12/2021 1430   PROT 6.6 01/12/2021 1430   ALBUMIN 3.9 01/12/2021 1430   AST 12 (L) 01/12/2021 1430   ALT 11 01/12/2021 1430   ALKPHOS 46 01/12/2021 1430   BILITOT 0.5 01/12/2021 1430   GFRNONAA >60 01/12/2021 1430   GFRAA >60 09/02/2019 0249   GFRAA >60 01/27/2019 0811       Component Value Date/Time   WBC 4.5 01/12/2021 1430   RBC 4.32 01/12/2021 1430   HGB 12.8 01/12/2021 1430   HGB 12.9 07/17/2019 0953   HCT 38.5 01/12/2021 1430   HCT 21.9 (L) 12/25/2018 1221   PLT 272 01/12/2021 1430   PLT 286 07/17/2019 0953   MCV 89.1 01/12/2021 1430   MCH 29.6 01/12/2021 1430   MCHC 33.2 01/12/2021 1430   RDW 12.4 01/12/2021 1430   LYMPHSABS 1.4 01/12/2021 1430   MONOABS 0.4 01/12/2021 1430    EOSABS 0.0 01/12/2021 1430   BASOSABS 0.0 01/12/2021 1430    No results found for: POCLITH, LITHIUM   No results found for: PHENYTOIN, PHENOBARB, VALPROATE, CBMZ   .res Assessment: Plan:    Recurrent major depression resistant to treatment (Laura Chang)  Generalized anxiety disorder - Plan: LORazepam (ATIVAN) 1 MG tablet  Attention deficit hyperactivity disorder (ADHD), predominantly inattentive type  Insomnia due to mental condition - Plan: LORazepam (ATIVAN) 1 MG tablet  Accelerated hypertension  Bipolar I disorder, most recent episode depressed (Laura Chang) - Plan: LORazepam (ATIVAN) 1 MG tablet   She has treatment resistant major depression at this time.  We discussed some of her recent abnormal behaviors leading to this depressive episode getting worse which she says were associated with heavy use of delta 8 and not a manic episode.  She realizes now that that was not good for her.  She stopped all use of other drugs including those available over-the-counter such as delta 8 or any other THC related products.  She is no longer having any of those types of behaviors and instead is depressed. She remains persistently depressed with lack of interest and lack of feeling for things that normally she would have feelings about.  She has low motivation and energy.  She is sad and down.  She is less productive than usual.  Her concentration is poor.  She has high degree of anxiety as well. She has a very negative self-image and low self-esteem.  These are all uncharacteristic for her  Failed multiple antidepressants.  She wants to pursue Spravato.  We discussed discussed the side effects in detail as well as the protocol required to receive Spravato.  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  When she stopped it she could not sleep.  We may consider alternative  depression medications such as Auvelity  Patient was administered Spravato 84 mg intranasally today.  The patient experienced the typical dissociation which gradually resolved over the 2-hour period of observation.  There were no complications.  Specifically the patient did not have nausea or vomiting or headache.  Blood pressures remained within normal ranges at the 40-minute and 2-hour follow-up intervals.  By the time the 2-hour observation period was met the patient was alert and oriented and able to exit without assistance.  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  See nursing note for further details. BP ok today  Unfortunately she remains depressed.  Discussed alternatives.  Adderall to XR 20 mg AM Clonidine increased to 0.2 mg twice daily. 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  But she is ambivalent about the medicine Ok temporary Ativan 1 mg 3 times daily as needed anxiety She needs to continue to have a conversation with primary care doctor about her blood pressure medicines.  She was prescribed Bystolic but is taking it intermittently.  Discussed potential benefits, risks, and side effects of stimulants with patient to include increased heart rate, palpitations, insomnia, increased anxiety, increased irritability, or decreased appetite.  Instructed patient to contact office if experiencing any significant tolerability issues. She wants to return to usual dose of Adderall for ADD bc of mor poor cognitive function with reduction.  Also discussed that depression will impair cognitive function.  Discussed safety plan at length with patient.  Advised patient to contact office with any worsening signs and symptoms.  Instructed patient to go to the [facility] emergency room for evaluation if experiencing any acute safety  concerns, to include suicidal intent.  Has Maintained sobriety  FU with Spravato twice weekly  Lynder Parents, MD, DFAPA     Please see After Visit Summary for patient specific instructions.  No future appointments.        No orders of the defined types were placed in this encounter.      -------------------------------

## 2022-03-26 NOTE — Progress Notes (Signed)
Nurse visit: ?  ?Patient arrived for her 3rd Spravato treatment. Pt was unable to received Spravato on 03/20/2022 due to her elevated B/P readings. Contacted pt this morning and her B/P was good at home so we will proceed and check at office. Pt is being treated for Treatment Resistant Depression, pt tolerated the 84 mg Spravato dose on 03/16/2022, no side effects so will continue the maintenance dose of 84 mg. Patient arrived and taken to treatment room.  Answered any questions and concerns the patient had since her last treatment and what to expect today. Pt was upset about missing her treatment on Tuesday, May 9th but understands her B/P has to be at a good reading before receiving any Spravato. Confirmed she had a ride home which is her husband would be coming back to pick up pt when done.  Pt's Spravato is ordered through [employer] and delivered to office, all Spravato medication is stored at doctors office per REMS/FDA guidelines. The medication is required to be locked behind two doors per FDA/REMS Protocol. Medication is also disposed of properly per regulations.  ?  ?  ?Began taking patient's vital signs at 8:58 AM 137/85, pulse 55. Instructed patient to blow her nose then recline back to a 45 degree angle. Gave patient first dose 28 mg nasal spray, patient is still trying to get the hang of using the nasal sprays, each nasal spray administered in each nostril as directed and waited 5 minutes between the second and third dose. After all 3 doses given pt did not complain of any nausea/vomiting, given a cup of water due to the taste after the administration of Spravato. Patient doing well, and is experiencing slight dissociation. Checked 40 minute vitals at 9:45 AM, 102/68, pulse 50.  No complaints. Explained she would be monitored for a total time of 120 minutes. Discharge vitals were taken at 11:00 AM 128/77, P 50. Dr. Clovis Pu did come visit with patient and discuss how treatment went at the end.  I walked pt to elevator, where her husband met her for the ride home. Recommend she go home and sleep or just relax on the couch. No driving, no intense activities. Verbalized understanding. Pt. will be receiving 2 treatments per week for 4 weeks as recommended. Then reassessed to go down to 1 treatment weekly if pt is stable at that time. Nurse was with pt a total of 60 minutes for clinical assessment. Pt is scheduled next Monday, May 15th. Pt instructed to call office with any problems or questions.  ?  ?  ?LOT [id number] ?EXP [id number] ?

## 2022-03-28 ENCOUNTER — Ambulatory Visit (INDEPENDENT_AMBULATORY_CARE_PROVIDER_SITE_OTHER): Payer: 59 | Admitting: Psychiatry

## 2022-03-28 ENCOUNTER — Ambulatory Visit: Payer: 59

## 2022-03-28 VITALS — BP 139/82 | HR 51

## 2022-03-28 DIAGNOSIS — F5105 Insomnia due to other mental disorder: Secondary | ICD-10-CM | POA: Diagnosis not present

## 2022-03-28 DIAGNOSIS — F900 Attention-deficit hyperactivity disorder, predominantly inattentive type: Secondary | ICD-10-CM

## 2022-03-28 DIAGNOSIS — F411 Generalized anxiety disorder: Secondary | ICD-10-CM

## 2022-03-28 DIAGNOSIS — I10 Essential (primary) hypertension: Secondary | ICD-10-CM

## 2022-03-28 DIAGNOSIS — F339 Major depressive disorder, recurrent, unspecified: Secondary | ICD-10-CM | POA: Diagnosis not present

## 2022-04-02 ENCOUNTER — Encounter: Payer: Self-pay | Admitting: Psychiatry

## 2022-04-02 ENCOUNTER — Encounter: Payer: 59 | Admitting: Psychiatry

## 2022-04-02 ENCOUNTER — Ambulatory Visit (INDEPENDENT_AMBULATORY_CARE_PROVIDER_SITE_OTHER): Payer: 59 | Admitting: Psychiatry

## 2022-04-02 ENCOUNTER — Ambulatory Visit: Payer: 59

## 2022-04-02 VITALS — BP 127/81 | HR 44

## 2022-04-02 DIAGNOSIS — I10 Essential (primary) hypertension: Secondary | ICD-10-CM

## 2022-04-02 DIAGNOSIS — F5105 Insomnia due to other mental disorder: Secondary | ICD-10-CM | POA: Diagnosis not present

## 2022-04-02 DIAGNOSIS — F339 Major depressive disorder, recurrent, unspecified: Secondary | ICD-10-CM

## 2022-04-02 DIAGNOSIS — F411 Generalized anxiety disorder: Secondary | ICD-10-CM | POA: Diagnosis not present

## 2022-04-02 DIAGNOSIS — F900 Attention-deficit hyperactivity disorder, predominantly inattentive type: Secondary | ICD-10-CM | POA: Diagnosis not present

## 2022-04-02 NOTE — Progress Notes (Signed)
Laura Chang [id number] 1956/06/06 66 y.o.  Subjective:   Patient ID:  Laura Chang is a 66 y.o. (DOB 16-Sep-1956) female.  Chief Complaint:  Chief Complaint  Patient presents with   Follow-up   Depression   Anxiety     HPI Laura Chang presents to the office today for follow-up of depression and anxiety and ADD.  seen November 12, 2019.  Melted down in 2020.  Went to [employer] in July.  No withdrawal.  1 drink since then.  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 She was on no stimulant and no SSRI but was taking Strattera and Wellbutrin.  The following changes were made. Stop Strattera. OK restart stimulant bc severe ADD Restart Adderall 1 daily for a few days and if tolerated then restart 1 twice daily. If not tolerated reduce the dosage if needed. May need to stop Wellbutrin if not tolerating the stimulant.  Yes.  DC Wellbutrin Restart Prozac 20 mg daily.  February 2021 appointment with the following noted: Completed grant proposal.  Couldn't doit without Adderall.  Sold a bunch of work.   Adderall XR lasts about 3 pm.  Strength seems about right.  BP been OK.  Not jittery.   Stopped Wellbutrin but had no SE. Mood drastically better with grant proposal and back on fluoxetine.  Less depressed and lethargic.  No anxiety.  Cut back on coffee. Started back with devotions and stronger faith. Plan: Continue Prozac 20 mg daily. May have to increase the dose at some point in the future given that she usually was taking higher dosages but she is getting good response at this time. Restart Wellbutrin off label for ADD since can't get 2 ADDERALL daily. 150 mg daily then 300 mg daily. She can adjust the dose between 150 mg and 300 mg daily to get the optimal effect.   05/11/2020 appointment with the following noted: Has been inconsistent with Prozac and Wellbutrin. Not sure of the effect of Wellbutrin. Biggest deterrent in work is anxiety.  Some of  the work is conceptual and difficult at times.  Can feel she's not up to a project at times.  Overall is OK but would like a steadier benefit from stimulant.  Exhausted from managing concentration and keeping up with things from the day.  Loses things.  Not good keeping up with schedule. Overall productive and emotionally OK. Can feel Adderall wear off. Mood is better in summer and worse in the winter.   F died in 31-Oct-2023 and that is a loss. No SE Wellbutrin. Still attends AA meetings.  Real benefit from [place] last year. Recognizes effect of anemia on ADD and mood.  Had iron infusions last winter. Plan:  Wellbutrin off label for ADD since can't get 2 ADDERALL daily. 150 mg daily then 300 mg daily.  01/24/2021 appointment with following noted: Doing a program called [employer] mindfulness app since Xmas.  CBT app helped the depression.  App helped her focus better.  Lost sign weight. Writing a lot. Before Xmas felt depressed and started negative thinking worse, self denigrating. Not drinking. More isolated.   Recognizes mo is narcissist.    Didn't tell anyone she was born until 3 mos later.  M aloof and uninterested in pt.  Lied about her birthday.  Mo lack of affection even with pt's kids. Going to [place] for a year and it helped her to quit drinking. Also misses kids being gone with a hole  also. Plan: No med changes  05/04/2021 appointment with the following noted: Therapist [name] thinks she's manic. Lost weight to 144#.   States she is still sleeping okay.  Admits she is hyper and recognizes that she is likely manic.  She feels great, euphoric with an increased sense of spiritual connectedness to God.  She has racing thoughts and talks fast and talks a lot and this is noted by her husband.  He thinks she is a bit hyper.  She has been able to maintain sobriety although she will have 1 glass of wine on special occasions but does not drink by herself.  She is not drinking to excess.   She denies any dangerous impulsivity.  She is clearly not depressed and not particularly anxious.  She has no concerns about her medication and she has been compliant.  06/16/21 appt noted: So much better.  Going through a lot but the manic thing happened on top of it.  So much slower.  Didn't feel like losing anything with risperidone.  Likes the Adderalll at 10 mg. Some drowsiness in the AM and very drowsy from risperidone 2 mg HS. Prayer life is better. Handling stress better. Less depressed with risperidone. Still likes trazodone. Sleeps well. Plan: Reduce Prozac to 10 mg daily.  Consider stopping it because it can feel the mania however she is reluctant to do that because she fears relapse of depression. Reduce risperidone to 1.5 mg nightly due to side effects.  Discussed risk of worsening mania.  07/25/2021 appointment with the following noted: Misses the Adderall and hard to function without it. Depressed now. Heavy chest.  Anxious and guilty.  Body feels heavy.   Hates Wellbutrin.   Plan: Increase fluoxetine to 20 mg daily Add Abilify 1/2 of 15 mg tablet daily Wean wellbutrin by 1 tablet each week  bc she feels it is not helpful and DT polypharmacy Reduce risperidone to 1 daily for 1 week and stop it. Disc risk of mania. Increase Adderall to XR 20 mg AM  08/08/21 Much less depressed and starting to feel normal I feel a lot better. No SE.  Speech normal off risperidone. Sleeping OK on trazaodone and enough.   Noticed benefit from Adderall again. Plan: continue fluoxetine to 20 mg daily Continue Abilify 1/2 of 15 mg tablet daily for depression and mania continue Adderall to XR 20 mg AM  10/10/2021 phone call: Pt stated she feels like the Abilify should be decreased to 74m.She said she is depressed but rational and not suicidal.She has an appt Monday and can wait until then if you prefer. MD response: Reduce the Abilify to 7.5 mg every other day.  We will meet on 10/16/2021 and decide  what to do from there.  10/16/2021 appointment with the following noted: More depressed.  Most depressed I've ever been.  Just numb.  Sense of grief.   Thinks the manic episode was unlike anything else she ever had.  Doesn't want to medicate against it.  Don't enjoy people.  Easily overwhelmed.  Had some death thoughts but not suicidal.  Has been functional.  Feels better today after reducing Abilify to every other day but she is only been doing that for 3 days. A/P: Episode of post manic depression was explained. continue fluoxetine to 20 mg daily Hold Abilify for 1 week then resume Abilify 1/2 of 15 mg tablet every other day for depression and mania continue Adderall to XR 20 mg AM  10/27/2021 appointment with the  following noted: I'm doing so much better.  Handling the depression better. Better self talk and spiritual focus has helped.   Dep 6/10 manifesting as anxiety with low confidence.   F died 2  years ago and M 66 yo and is dependent . She is working hard to feel better but still feels depressed.  She almost feels like she has a little more anxiety since restarting Abilify every other day. Plan: continue fluoxetine to 20 mg daily DC Abilify .  Vrayalar 1.5 mg QOD to try to get rid of depression ASAP. continue Adderall to XR 20 mg AM  11/10/2021 appointment with the following noted: Busy with Xmas and it was fun with family but then a big let down.  Did well with it.  Functioned well with it.  Working hard on things with depression.  Not shutting down. Not sure but feels better today but yesterday was hard.  Difficulty dealing with mother.  She won't do anything to help herself.  Yesterday with her all day.  Won't do PT and has isolated herself.    Lack of confidence.   No SE with Vraylar.  11/24/21 urgent appointment appt noted: More and more depressed.   So anxious and doesn't want to be alone but can do so. No appetite. Hurts inside. Has had some fleeting suicidal thoughts but  would not act on them.  Tolerating meds. Has been consistent with Vraylar 1.5 mg every other day, fluoxetine 20 mg daily Plan: Increase Vraylar to 1.5 mg daily Change Prozac to Trintellix 10 mg daily. Discussed side effects of each continue Adderall to XR 20 mg AM  12/27/2021 appointment with the following noted: Not OK.  I feel less depressed but feels bat shit. Not sleeping well.  Extremely anxious. Off and on sleep. 3-4 hours of sleep.   Still having daily SI.  But also become obvious has so much to do.  Overwhelmed by tasks.   Needs anxiety meds to just function. Not more motivated.  Walked yesterday.   Feels afraid like in trouble but not irritable or angry. DC DT agitation Vraylar to 1.5 mg daily Change Prozac to Trintellix 10 mg daily. Hold Adderall to XR 20 mg AM Clonidine 0.1 1/2 tablet twice daily for 2 days and if needed for anxiety and sleep increase to 1 twice daily Ok temporary Ativan 1 mg 3 times daily as needed anxiety  01/05/22 appt noted: Off fluoxetine and  Trintellix.  Only on Ativan, trazodone and Adderall XR 20 plus added clonidine 0.1 mg BID Didn't think she needed to start Trintellix. Not taking Ativan.   Didn't like herself last week. Feels some better today. Wonders if the manic sx Not agitated.  Anxiety kind of calmed down.  A lot to be anxious about situationally.  $ stress. Concerns about downers with meds. Can't access normal personality. ? Lethargy and inability to talk as sE. Plan: Latuda 20-40 mg daily with food. Adderall to XR 20 mg AM Clonidine 0.1 1/2 tablet twice daily  reduce dose to be sure no SE Ok temporary Ativan 1 mg 3 times daily as needed anxiety  01/19/22 appt noted: Taking Latuda 20 mg daily.  Took 40 mg once and felt anxious and  SI Still depressed and not very reactive Anxiety mainly about the depression and fears of the future. She wants to revisit manic sx and thinks it was maybe bc taking delta 8 bc was taking a lot of it so  still doesn't think she's  classic bipolar. She wants to only take Prozac bc thinks Latuda is perpetuating depression. Says the delta 8 was very psychaedelic.  When not taking it was not manic.  Sleeping ok again.  Plan: Per her request DC Latuda 20-40 mg daily with food. She wants to continue Prozac alone AMA  Adderall to XR 20 mg AM Clonidine 0.1 1/2 tablet twice daily  reduce dose to be sure no SE Ok temporary Ativan 1 mg 3 times daily as needed anxiety  01/23/2022 phone call complaining of increased anxiety since stopping Latuda.  She will try increasing clonidine.  01/26/2022 phone call not feeling well and wanted to restart the Vraylar.  However notes indicate that had made her agitated therefore she was encouraged to pick up samples of Rexulti 1 mg and start that instead.  02/06/2022 phone call: Stating she felt the Rexulti was helping with depression but she was not sleeping well and obsessing over things.  She was encouraged to increase Rexulti to 2 mg daily and increase trazodone for sleep.  02/09/2022 appointment with the following noted: This was an urgent work in appointment No sleep last night with trazodone 100 mg HS Nothing really better depression or anxiety. Ruminating negative anxious thoughts. Did not tolerate Rexulti because it was causing insomnia.  Does not think it helped depression.  Lacks emotion that she should have.  Lacks her usual personality.  Some hopeless thoughts.  Some death thoughts.  Some suicidal thoughts without plan or intent Plan: DC Rexulti and Prozac & DC trazodone Adderall to XR 20 mg AM Clonidine 0.1 1/tablet twice daily  reduce dose to be sure no SE Ok temporary Ativan 1 mg 3 times daily as needed anxiety Start Seroquel XR 150 mg nightly  03/02/2022 appointment: Langley Gauss called back a few days after starting Seroquel stating it was making her more anxious and more depressed.  This seemed unlikely as this medicine rarely ever causes anxiety.  She  stopped the medication waited 3 days and called back still had anxiety and depression but thought perhaps the anxiety was a little better.  She did not want to take the Seroquel. She knew about the option of Spravato and wanted to pursue that. Now questions whether to return to Seroquel while waiting to start Spravato bc feels just as bad without it and knows she didn't give it enough time to work.   MADRS 46  ECT-MADRS    Flowsheet Row Office Visit from 03/02/2022 in [facility]  MADRS Total Score 46      03/14/22 appt noted: Pt received Spravato 56 mg first dose today with some dissociative sx which were not severe.  She was anxious prior to the administration and felt better after receiving lorazepam 1 mg.  No NV, or HA. Wants to continue Spravato. Ongoing depression and desperate to feel better.  I'm not myself DT deprsssion which is most severe in recent history.  Anhedonia.  Low motivation.  Social avoidance. Continues to think all recent med trials are making her worse.  Sleep ok with Seroquel.  03/16/22 appt noted: Received Spravato 84 mg for the first time.  some dissociative sx which were not severe.  She was anxious prior to the administration and felt better after receiving lorazepam 1 mg.  No NV, or HA. Wants to continue Spravato.   Does not feel any better or different since the last appt.  Ongoing depression.  Ongoing depression and desperate to feel better.  I'm not myself DT deprsssion  which is most severe in recent history.  Anhedonia.  Low motivation.  Social avoidance. Continues to think all recent med trials are making her worse.  Sleep ok with Seroquel.  Does not want to continue Seroquel for TRD.  03/20/2022 appointment noted: Came for Spravato administration today.  However blood pressure was significantly elevated approximately 180/115.  She was given lorazepam 1 mg and clonidine 0.2 mg to try to get it down. She states she regretted stopping the Seroquel  XR 300 mg tablets.  She now realizes it was helpful.  She did not sleep much at all last night.  She did not take the Adderall this morning. 2 to 3 hours after arrival blood pressure was still elevated at  170/110, 62 pulse.  For Spravato administration was canceled for today.  She admits to being anxious and depressed.  She is not suicidal.  She is highly motivated to receive the Spravato.  We discussed getting it tomorrow.  03/22/2022 appointment noted: Patient's blood pressure was never stable enough yesterday in order to get her in for Spravato administration.  She was encouraged to see her primary care doctor.  It is better today.  03/26/2022 appointment with the following noted: Blood pressure was better.  Saw her primary care doctor who started on oral Bystolic 2.5 mg daily. Received Spravato 84 mg today as scheduled.  Tolerated it well without nausea or vomiting headache or chest pain or palpitations.  Her blood pressure was borderline but manageable. She remains depressed and anxious.  She is ambivalent about the medicine and desperate to get to feel better.  Continues to have anhedonia and low energy and low motivation and reduced ability to do things.  Less social.  Not suicidal.  03/28/22 appt noted: Received Spravato 84 mg today as scheduled.  Tolerated it well without nausea or vomiting headache or chest pain or palpitations.  Her blood pressure was borderline but manageable. Has not seen any improvement so far.  Tolerating Seroquel.  Inconsistent with Bystolic and BP has been borderline high. Still depressed and anxious and anhedonia.  Low motivation, energy, productivity. Taking quetiapine and tolerating XR 300 mg nightly.  Past Psychiatric Medication Trials: fluoxetine, duloxetine, Viibryd, lamotrigine, Pristiq, sertraline, citalopram,  Trintellix anxious and SI Adderall, Adderall XR, Vyvanse, Ritalin, Strattera low dose NR Lorazepam Trazodone Wellbutrin  Depakote,  lamotrigine  cog complaints Lithium remotely Abilify 7.5  Vraylar 1.5 mg daily agitation and insomnia Rexulti insomnia Seroquel XR briefly  At visit November 12, 2019. We discussed Patient developed an increasingly severe alcohol dependence problem since her last visit in January.  She went to [facility] and has had no alcohol since then except 1 day.  She never abused stimulants but they took her off the stimulants at [employer] [facility].  Her ADD was markedly worse.  The Wellbutrin did not helped the ADD.   D history lamotrigine rash at 66 yo  Review of Systems:  Review of Systems  Constitutional:  Positive for fatigue.  Respiratory:  Negative for shortness of breath.   Cardiovascular:  Negative for palpitations.  Musculoskeletal:  Positive for arthralgias, back pain and joint swelling.       SP hip surgery October 2020  Neurological:  Negative for dizziness.  Psychiatric/Behavioral:  Positive for dysphoric mood and sleep disturbance. Negative for agitation, behavioral problems, confusion, decreased concentration, hallucinations, self-injury and suicidal ideas. The patient is nervous/anxious. The patient is not hyperactive.    Medications: I have reviewed the patient's current medications.  Current  Outpatient Medications  Medication Sig Dispense Refill   amphetamine-dextroamphetamine (ADDERALL XR) 20 MG 24 hr capsule Take 1 capsule (20 mg total) by mouth every morning. 30 capsule 0   cloNIDine (CATAPRES) 0.1 MG tablet TAKE 1 TABLET BY MOUTH TWICE A DAY 60 tablet 0   cloNIDine (CATAPRES) 0.2 MG tablet Take 1 tablet (0.2 mg total) by mouth 2 (two) times daily. 60 tablet 0   Esketamine HCl, 84 MG Dose, (SPRAVATO, 84 MG DOSE,) 28 MG/DEVICE SOPK Administer 84 mg ( 3 of the 28 mg devices) intranasally as directed twice a week 3 each 5   iron polysaccharides (NIFEREX) 150 MG capsule TAKE 1 CAPSULE BY MOUTH EVERY DAY 30 capsule 1   LORazepam (ATIVAN) 1 MG tablet Take 1 tablet (1 mg total) by mouth every  8 (eight) hours. 90 tablet 0   nebivolol (BYSTOLIC) 2.5 MG tablet Take 2.5 mg by mouth daily.     QUEtiapine (SEROQUEL XR) 300 MG 24 hr tablet Take 1 tablet (300 mg total) by mouth at bedtime. 30 tablet 0   No current facility-administered medications for this visit.    Medication Side Effects: None  Allergies:  Allergies  Allergen Reactions   Metronidazole Shortness Of Breath and Other (See Comments)    Heart pounding   Ferrlecit [Na Ferric Gluc Cplx In Sucrose] Other (See Comments)    Infusion reaction 05/12/2019    Past Medical History:  Diagnosis Date   ADHD    Anemia    Anxiety    Arthritis    Depression    Heart murmur    i went to see a cardiologit slast eyar  and i had zero plaque,    PONV (postoperative nausea and vomiting)    Recovering alcoholic in remission ([place])     Family History  Problem Relation Age of Onset   Atrial fibrillation Mother    CAD Father     Social History   Socioeconomic History   Marital status: Married    Spouse name: Not on file   Number of children: Not on file   Years of education: Not on file   Highest education level: Not on file  Occupational History   Not on file  Tobacco Use   Smoking status: Former    Types: Cigarettes    Quit date: 08/16/2003    Years since quitting: 18.6   Smokeless tobacco: Never   Tobacco comments:     08-28-2019 "i smoked 2 cigarettes in the last month since my father  passed"  Vaping Use   Vaping Use: Never used  Substance and Sexual Activity   Alcohol use: Yes    Alcohol/week: 10.0 standard drinks    Types: 10 Glasses of wine per week   Drug use: No   Sexual activity: Not on file  Other Topics Concern   Not on file  Social History Narrative   Not on file   Social Determinants of Health   Financial Resource Strain: Not on file  Food Insecurity: Not on file  Transportation Needs: Not on file  Physical Activity: Not on file  Stress: Not on file  Social Connections: Not on file   Intimate Partner Violence: Not on file    Past Medical History, Surgical history, Social history, and Family history were reviewed and updated as appropriate.   Please see review of systems for further details on the patient's review from today.   Objective:   Physical Exam:  There were no vitals taken for  this visit.  Physical Exam Constitutional:      General: She is not in acute distress. Neurological:     Mental Status: She is alert and oriented to person, place, and time.     Coordination: Coordination normal.     Gait: Gait normal.  Psychiatric:        Attention and Perception: Perception normal. She is attentive.        Mood and Affect: Mood is anxious and depressed. Affect is blunt. Affect is not labile, angry, tearful or inappropriate.        Speech: Speech is not rapid and pressured or slurred.        Behavior: Behavior is not agitated, slowed or aggressive.        Thought Content: Thought content is not paranoid or delusional. Thought content does not include homicidal or suicidal ideation. Thought content does not include suicidal plan.        Cognition and Memory: Cognition normal.        Judgment: Judgment normal.     Comments: Insight intact. No auditory or visual hallucinations. No delusions.  Depression  ongoing. More anxious  No Sui intent plan     Lab Review:     Component Value Date/Time   NA 137 01/12/2021 1430   NA 140 11/18/2018 1544   K 3.8 01/12/2021 1430   CL 108 01/12/2021 1430   CO2 22 01/12/2021 1430   GLUCOSE 94 01/12/2021 1430   BUN 14 01/12/2021 1430   BUN 20 11/18/2018 1544   CREATININE 0.82 01/12/2021 1430   CALCIUM 8.9 01/12/2021 1430   PROT 6.6 01/12/2021 1430   ALBUMIN 3.9 01/12/2021 1430   AST 12 (L) 01/12/2021 1430   ALT 11 01/12/2021 1430   ALKPHOS 46 01/12/2021 1430   BILITOT 0.5 01/12/2021 1430   GFRNONAA >60 01/12/2021 1430   GFRAA >60 09/02/2019 0249   GFRAA >60 01/27/2019 0811       Component Value Date/Time    WBC 4.5 01/12/2021 1430   RBC 4.32 01/12/2021 1430   HGB 12.8 01/12/2021 1430   HGB 12.9 07/17/2019 0953   HCT 38.5 01/12/2021 1430   HCT 21.9 (L) 12/25/2018 1221   PLT 272 01/12/2021 1430   PLT 286 07/17/2019 0953   MCV 89.1 01/12/2021 1430   MCH 29.6 01/12/2021 1430   MCHC 33.2 01/12/2021 1430   RDW 12.4 01/12/2021 1430   LYMPHSABS 1.4 01/12/2021 1430   MONOABS 0.4 01/12/2021 1430   EOSABS 0.0 01/12/2021 1430   BASOSABS 0.0 01/12/2021 1430    No results found for: POCLITH, LITHIUM   No results found for: PHENYTOIN, PHENOBARB, VALPROATE, CBMZ   .res Assessment: Plan:    Recurrent major depression resistant to treatment ([place])  Generalized anxiety disorder  Attention deficit hyperactivity disorder (ADHD), predominantly inattentive type  Insomnia due to mental condition  Accelerated hypertension   She has treatment resistant major depression at this time.  We discussed some of her recent abnormal behaviors leading to this depressive episode getting worse which she says were associated with heavy use of delta 8 and not a manic episode.  She realizes now that that was not good for her.  She stopped all use of other drugs including those available over-the-counter such as delta 8 or any other THC related products.  She is no longer having any of those types of behaviors and instead is depressed. She remains persistently depressed with lack of interest and lack of feeling for things that  normally she would have feelings about.  She has low motivation and energy.  She is sad and down.  She is less productive than usual.  Her concentration is poor.  She has high degree of anxiety as well. She has a very negative self-image and low self-esteem.  These are all uncharacteristic for her  Failed multiple antidepressants.  She wants to pursue Spravato.  We discussed discussed the side effects in detail as well as the protocol required to receive Spravato.  S The protocol requires use of a  antidepressant along with Spravato at present we are using Seroquel XR 300 mg nightly for treatment resistant depression as well as sleep and anxiety.  When she stopped it she could not sleep.  We may consider alternative depression medications such as [place] once anxiety is better managed.  Today 5th administration of Spravato 84 mg but missed a dose DT hypertension. Patient was administered Spravato 84 mg intranasally today.  The patient experienced the typical dissociation which gradually resolved over the 2-hour period of observation.  There were no complications.  Specifically the patient did not have nausea or vomiting or headache.  Blood pressures remained within normal ranges at the 40-minute and 2-hour follow-up intervals.  By the time the 2-hour observation period was met the patient was alert and oriented and able to exit without assistance.  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  See nursing note for further details. BP ok today  Unfortunately she remains depressed.  Discussed alternatives.  Adderall to XR 20 mg AM Clonidine increased to 0.2 mg twice daily. 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  But she is ambivalent about the medicine Ok temporary Ativan 1 mg 3 times daily as needed anxiety She needs to continue to have a conversation with primary care doctor about her blood pressure medicines.  She was prescribed Bystolic but is taking it intermittently.  Take Bystolic consistently per PCP  Discussed potential benefits, risks, and side effects of stimulants with patient to include increased heart rate, palpitations, insomnia, increased anxiety, increased irritability, or decreased appetite.  Instructed patient to contact office if experiencing any significant tolerability issues. She wants to return to usual dose  of Adderall for ADD bc of mor poor cognitive function with reduction.  Also discussed that depression will impair cognitive function.  Discussed safety plan at length with patient.  Advised patient to contact office with any worsening signs and symptoms.  Instructed patient to go to the [facility] emergency room for evaluation if experiencing any acute safety concerns, to include suicidal intent.  Has Maintained sobriety  FU with Spravato twice weekly  Lynder Parents, MD, DFAPA     Please see After Visit Summary for patient specific instructions.  No future appointments.        No orders of the defined types were placed in this encounter.      -------------------------------

## 2022-04-02 NOTE — Progress Notes (Signed)
Laura Chang [id number] Apr 25, 1956 66 y.o.  Subjective:   Patient ID:  Laura Chang is a 66 y.o. (DOB 1956-03-11) female.  Chief Complaint:  Chief Complaint  Patient presents with   Follow-up   Depression   Anxiety   Fatigue     HPI Laura Chang presents to the office today for follow-up of depression and anxiety and ADD.  seen November 12, 2019.  Melted down in 2020.  Went to [employer] in July.  No withdrawal.  1 drink since then.  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 She was on no stimulant and no SSRI but was taking Strattera and Wellbutrin.  The following changes were made. Stop Strattera. OK restart stimulant bc severe ADD Restart Adderall 1 daily for a few days and if tolerated then restart 1 twice daily. If not tolerated reduce the dosage if needed. May need to stop Wellbutrin if not tolerating the stimulant.  Yes.  DC Wellbutrin Restart Prozac 20 mg daily.  February 2021 appointment with the following noted: Completed grant proposal.  Couldn't doit without Adderall.  Sold a bunch of work.   Adderall XR lasts about 3 pm.  Strength seems about right.  BP been OK.  Not jittery.   Stopped Wellbutrin but had no SE. Mood drastically better with grant proposal and back on fluoxetine.  Less depressed and lethargic.  No anxiety.  Cut back on coffee. Started back with devotions and stronger faith. Plan: Continue Prozac 20 mg daily. May have to increase the dose at some point in the future given that she usually was taking higher dosages but she is getting good response at this time. Restart Wellbutrin off label for ADD since can't get 2 ADDERALL daily. 150 mg daily then 300 mg daily. She can adjust the dose between 150 mg and 300 mg daily to get the optimal effect.   05/11/2020 appointment with the following noted: Has been inconsistent with Prozac and Wellbutrin. Not sure of the effect of Wellbutrin. Biggest deterrent in work is  anxiety.  Some of the work is conceptual and difficult at times.  Can feel she's not up to a project at times.  Overall is OK but would like a steadier benefit from stimulant.  Exhausted from managing concentration and keeping up with things from the day.  Loses things.  Not good keeping up with schedule. Overall productive and emotionally OK. Can feel Adderall wear off. Mood is better in summer and worse in the winter.   F died in 2023-10-22 and that is a loss. No SE Wellbutrin. Still attends AA meetings.  Real benefit from [place] last year. Recognizes effect of anemia on ADD and mood.  Had iron infusions last winter. Plan:  Wellbutrin off label for ADD since can't get 2 ADDERALL daily. 150 mg daily then 300 mg daily.  01/24/2021 appointment with following noted: Doing a program called [employer] mindfulness app since Xmas.  CBT app helped the depression.  App helped her focus better.  Lost sign weight. Writing a lot. Before Xmas felt depressed and started negative thinking worse, self denigrating. Not drinking. More isolated.   Recognizes mo is narcissist.    Didn't tell anyone she was born until 3 mos later.  M aloof and uninterested in pt.  Lied about her birthday.  Mo lack of affection even with pt's kids. Going to [place] for a year and it helped her to quit drinking. Also misses kids being gone  with a hole also. Plan: No med changes  05/04/2021 appointment with the following noted: Therapist [name] thinks she's manic. Lost weight to 144#.   States she is still sleeping okay.  Admits she is hyper and recognizes that she is likely manic.  She feels great, euphoric with an increased sense of spiritual connectedness to God.  She has racing thoughts and talks fast and talks a lot and this is noted by her husband.  He thinks she is a bit hyper.  She has been able to maintain sobriety although she will have 1 glass of wine on special occasions but does not drink by herself.  She is not  drinking to excess.  She denies any dangerous impulsivity.  She is clearly not depressed and not particularly anxious.  She has no concerns about her medication and she has been compliant.  06/16/21 appt noted: So much better.  Going through a lot but the manic thing happened on top of it.  So much slower.  Didn't feel like losing anything with risperidone.  Likes the Adderalll at 10 mg. Some drowsiness in the AM and very drowsy from risperidone 2 mg HS. Prayer life is better. Handling stress better. Less depressed with risperidone. Still likes trazodone. Sleeps well. Plan: Reduce Prozac to 10 mg daily.  Consider stopping it because it can feel the mania however she is reluctant to do that because she fears relapse of depression. Reduce risperidone to 1.5 mg nightly due to side effects.  Discussed risk of worsening mania.  07/25/2021 appointment with the following noted: Misses the Adderall and hard to function without it. Depressed now. Heavy chest.  Anxious and guilty.  Body feels heavy.   Hates Wellbutrin.   Plan: Increase fluoxetine to 20 mg daily Add Abilify 1/2 of 15 mg tablet daily Wean wellbutrin by 1 tablet each week  bc she feels it is not helpful and DT polypharmacy Reduce risperidone to 1 daily for 1 week and stop it. Disc risk of mania. Increase Adderall to XR 20 mg AM  08/08/21 Much less depressed and starting to feel normal I feel a lot better. No SE.  Speech normal off risperidone. Sleeping OK on trazaodone and enough.   Noticed benefit from Adderall again. Plan: continue fluoxetine to 20 mg daily Continue Abilify 1/2 of 15 mg tablet daily for depression and mania continue Adderall to XR 20 mg AM  10/10/2021 phone call: Pt stated she feels like the Abilify should be decreased to 31m.She said she is depressed but rational and not suicidal.She has an appt Monday and can wait until then if you prefer. MD response: Reduce the Abilify to 7.5 mg every other day.  We will meet on  10/16/2021 and decide what to do from there.  10/16/2021 appointment with the following noted: More depressed.  Most depressed I've ever been.  Just numb.  Sense of grief.   Thinks the manic episode was unlike anything else she ever had.  Doesn't want to medicate against it.  Don't enjoy people.  Easily overwhelmed.  Had some death thoughts but not suicidal.  Has been functional.  Feels better today after reducing Abilify to every other day but she is only been doing that for 3 days. A/P: Episode of post manic depression was explained. continue fluoxetine to 20 mg daily Hold Abilify for 1 week then resume Abilify 1/2 of 15 mg tablet every other day for depression and mania continue Adderall to XR 20 mg AM  10/27/2021  appointment with the following noted: I'm doing so much better.  Handling the depression better. Better self talk and spiritual focus has helped.   Dep 6/10 manifesting as anxiety with low confidence.   F died 2  years ago and M 66 yo and is dependent . She is working hard to feel better but still feels depressed.  She almost feels like she has a little more anxiety since restarting Abilify every other day. Plan: continue fluoxetine to 20 mg daily DC Abilify .  Vrayalar 1.5 mg QOD to try to get rid of depression ASAP. continue Adderall to XR 20 mg AM  11/10/2021 appointment with the following noted: Busy with Xmas and it was fun with family but then a big let down.  Did well with it.  Functioned well with it.  Working hard on things with depression.  Not shutting down. Not sure but feels better today but yesterday was hard.  Difficulty dealing with mother.  She won't do anything to help herself.  Yesterday with her all day.  Won't do PT and has isolated herself.    Lack of confidence.   No SE with Vraylar.  11/24/21 urgent appointment appt noted: More and more depressed.   So anxious and doesn't want to be alone but can do so. No appetite. Hurts inside. Has had some fleeting  suicidal thoughts but would not act on them.  Tolerating meds. Has been consistent with Vraylar 1.5 mg every other day, fluoxetine 20 mg daily Plan: Increase Vraylar to 1.5 mg daily Change Prozac to Trintellix 10 mg daily. Discussed side effects of each continue Adderall to XR 20 mg AM  12/27/2021 appointment with the following noted: Not OK.  I feel less depressed but feels bat shit. Not sleeping well.  Extremely anxious. Off and on sleep. 3-4 hours of sleep.   Still having daily SI.  But also become obvious has so much to do.  Overwhelmed by tasks.   Needs anxiety meds to just function. Not more motivated.  Walked yesterday.   Feels afraid like in trouble but not irritable or angry. DC DT agitation Vraylar to 1.5 mg daily Change Prozac to Trintellix 10 mg daily. Hold Adderall to XR 20 mg AM Clonidine 0.1 1/2 tablet twice daily for 2 days and if needed for anxiety and sleep increase to 1 twice daily Ok temporary Ativan 1 mg 3 times daily as needed anxiety  01/05/22 appt noted: Off fluoxetine and  Trintellix.  Only on Ativan, trazodone and Adderall XR 20 plus added clonidine 0.1 mg BID Didn't think she needed to start Trintellix. Not taking Ativan.   Didn't like herself last week. Feels some better today. Wonders if the manic sx Not agitated.  Anxiety kind of calmed down.  A lot to be anxious about situationally.  $ stress. Concerns about downers with meds. Can't access normal personality. ? Lethargy and inability to talk as sE. Plan: Latuda 20-40 mg daily with food. Adderall to XR 20 mg AM Clonidine 0.1 1/2 tablet twice daily  reduce dose to be sure no SE Ok temporary Ativan 1 mg 3 times daily as needed anxiety  01/19/22 appt noted: Taking Latuda 20 mg daily.  Took 40 mg once and felt anxious and  SI Still depressed and not very reactive Anxiety mainly about the depression and fears of the future. She wants to revisit manic sx and thinks it was maybe bc taking delta 8 bc was  taking a lot of it so still  doesn't think she's classic bipolar. She wants to only take Prozac bc thinks Latuda is perpetuating depression. Says the delta 8 was very psychaedelic.  When not taking it was not manic.  Sleeping ok again.  Plan: Per her request DC Latuda 20-40 mg daily with food. She wants to continue Prozac alone AMA  Adderall to XR 20 mg AM Clonidine 0.1 1/2 tablet twice daily  reduce dose to be sure no SE Ok temporary Ativan 1 mg 3 times daily as needed anxiety  01/23/2022 phone call complaining of increased anxiety since stopping Latuda.  She will try increasing clonidine.  01/26/2022 phone call not feeling well and wanted to restart the Vraylar.  However notes indicate that had made her agitated therefore she was encouraged to pick up samples of Rexulti 1 mg and start that instead.  02/06/2022 phone call: Stating she felt the Rexulti was helping with depression but she was not sleeping well and obsessing over things.  She was encouraged to increase Rexulti to 2 mg daily and increase trazodone for sleep.  02/09/2022 appointment with the following noted: This was an urgent work in appointment No sleep last night with trazodone 100 mg HS Nothing really better depression or anxiety. Ruminating negative anxious thoughts. Did not tolerate Rexulti because it was causing insomnia.  Does not think it helped depression.  Lacks emotion that she should have.  Lacks her usual personality.  Some hopeless thoughts.  Some death thoughts.  Some suicidal thoughts without plan or intent Plan: DC Rexulti and Prozac & DC trazodone Adderall to XR 20 mg AM Clonidine 0.1 1/tablet twice daily  reduce dose to be sure no SE Ok temporary Ativan 1 mg 3 times daily as needed anxiety Start Seroquel XR 150 mg nightly  03/02/2022 appointment: Langley Gauss called back a few days after starting Seroquel stating it was making her more anxious and more depressed.  This seemed unlikely as this medicine rarely ever  causes anxiety.  She stopped the medication waited 3 days and called back still had anxiety and depression but thought perhaps the anxiety was a little better.  She did not want to take the Seroquel. She knew about the option of Spravato and wanted to pursue that. Now questions whether to return to Seroquel while waiting to start Spravato bc feels just as bad without it and knows she didn't give it enough time to work.   MADRS 46  ECT-MADRS    Flowsheet Row Office Visit from 03/02/2022 in [facility]  MADRS Total Score 46      03/14/22 appt noted: Pt received Spravato 56 mg first dose today with some dissociative sx which were not severe.  She was anxious prior to the administration and felt better after receiving lorazepam 1 mg.  No NV, or HA. Wants to continue Spravato. Ongoing depression and desperate to feel better.  I'm not myself DT deprsssion which is most severe in recent history.  Anhedonia.  Low motivation.  Social avoidance. Continues to think all recent med trials are making her worse.  Sleep ok with Seroquel.  03/16/22 appt noted: Received Spravato 84 mg for the first time.  some dissociative sx which were not severe.  She was anxious prior to the administration and felt better after receiving lorazepam 1 mg.  No NV, or HA. Wants to continue Spravato.   Does not feel any better or different since the last appt.  Ongoing depression.  Ongoing depression and desperate to feel better.  I'm not  myself DT deprsssion which is most severe in recent history.  Anhedonia.  Low motivation.  Social avoidance. Continues to think all recent med trials are making her worse.  Sleep ok with Seroquel.  Does not want to continue Seroquel for TRD.  03/20/2022 appointment noted: Came for Spravato administration today.  However blood pressure was significantly elevated approximately 180/115.  She was given lorazepam 1 mg and clonidine 0.2 mg to try to get it down. She states she regretted  stopping the Seroquel XR 300 mg tablets.  She now realizes it was helpful.  She did not sleep much at all last night.  She did not take the Adderall this morning. 2 to 3 hours after arrival blood pressure was still elevated at  170/110, 62 pulse.  For Spravato administration was canceled for today.  She admits to being anxious and depressed.  She is not suicidal.  She is highly motivated to receive the Spravato.  We discussed getting it tomorrow.  03/22/2022 appointment noted: Patient's blood pressure was never stable enough yesterday in order to get her in for Spravato administration.  She was encouraged to see her primary care doctor.  It is better today.  03/26/2022 appointment with the following noted: Blood pressure was better.  Saw her primary care doctor who started on oral Bystolic 2.5 mg daily. Received Spravato 84 mg today as scheduled.  Tolerated it well without nausea or vomiting headache or chest pain or palpitations.  Her blood pressure was borderline but manageable. She remains depressed and anxious.  She is ambivalent about the medicine and desperate to get to feel better.  Continues to have anhedonia and low energy and low motivation and reduced ability to do things.  Less social.  Not suicidal.  03/28/22 appt noted: Received Spravato 84 mg today as scheduled.  Tolerated it well without nausea or vomiting headache or chest pain or palpitations.  Her blood pressure was borderline but manageable. Has not seen any improvement so far.  Tolerating Seroquel.  Inconsistent with Bystolic and BP has been borderline high. Still depressed and anxious and anhedonia.  Low motivation, energy, productivity. Taking quetiapine and tolerating XR 300 mg nightly.  Past Psychiatric Medication Trials: fluoxetine, duloxetine, Viibryd, lamotrigine, Pristiq, sertraline, citalopram,  Trintellix anxious and SI Adderall, Adderall XR, Vyvanse, Ritalin, Strattera low dose  NR Lorazepam Trazodone Wellbutrin  Depakote,  lamotrigine cog complaints Lithium remotely Abilify 7.5  Vraylar 1.5 mg daily agitation and insomnia Rexulti insomnia Seroquel XR briefly  At visit November 12, 2019. We discussed Patient developed an increasingly severe alcohol dependence problem since her last visit in January.  She went to [facility] and has had no alcohol since then except 1 day.  She never abused stimulants but they took her off the stimulants at [employer].  Her ADD was markedly worse.  The Wellbutrin did not helped the ADD.   D history lamotrigine rash at 66 yo  Review of Systems:  Review of Systems  Constitutional:  Positive for fatigue.  Respiratory:  Negative for shortness of breath.   Cardiovascular:  Negative for chest pain and palpitations.  Musculoskeletal:  Positive for arthralgias, back pain and joint swelling.       SP hip surgery October 2020  Neurological:  Negative for dizziness and tremors.  Psychiatric/Behavioral:  Positive for dysphoric mood and sleep disturbance. Negative for agitation, behavioral problems, confusion, decreased concentration, hallucinations, self-injury and suicidal ideas. The patient is nervous/anxious. The patient is not hyperactive.    Medications: I  have reviewed the patient's current medications.  Current Outpatient Medications  Medication Sig Dispense Refill   amphetamine-dextroamphetamine (ADDERALL XR) 20 MG 24 hr capsule Take 1 capsule (20 mg total) by mouth every morning. 30 capsule 0   cloNIDine (CATAPRES) 0.1 MG tablet TAKE 1 TABLET BY MOUTH TWICE A DAY 60 tablet 0   cloNIDine (CATAPRES) 0.2 MG tablet Take 1 tablet (0.2 mg total) by mouth 2 (two) times daily. 60 tablet 0   Esketamine HCl, 84 MG Dose, (SPRAVATO, 84 MG DOSE,) 28 MG/DEVICE SOPK Administer 84 mg ( 3 of the 28 mg devices) intranasally as directed twice a week 3 each 5   iron polysaccharides (NIFEREX) 150 MG capsule TAKE 1 CAPSULE BY MOUTH EVERY  DAY 30 capsule 1   LORazepam (ATIVAN) 1 MG tablet Take 1 tablet (1 mg total) by mouth every 8 (eight) hours. 90 tablet 0   nebivolol (BYSTOLIC) 2.5 MG tablet Take 2.5 mg by mouth daily.     QUEtiapine (SEROQUEL XR) 300 MG 24 hr tablet Take 1 tablet (300 mg total) by mouth at bedtime. 30 tablet 0   No current facility-administered medications for this visit.    Medication Side Effects: None  Allergies:  Allergies  Allergen Reactions   Metronidazole Shortness Of Breath and Other (See Comments)    Heart pounding   Ferrlecit [Na Ferric Gluc Cplx In Sucrose] Other (See Comments)    Infusion reaction 05/12/2019    Past Medical History:  Diagnosis Date   ADHD    Anemia    Anxiety    Arthritis    Depression    Heart murmur    i went to see a cardiologit slast eyar  and i had zero plaque,    PONV (postoperative nausea and vomiting)    Recovering alcoholic in remission ([place])     Family History  Problem Relation Age of Onset   Atrial fibrillation Mother    CAD Father     Social History   Socioeconomic History   Marital status: Married    Spouse name: Not on file   Number of children: Not on file   Years of education: Not on file   Highest education level: Not on file  Occupational History   Not on file  Tobacco Use   Smoking status: Former    Types: Cigarettes    Quit date: 08/16/2003    Years since quitting: 18.6   Smokeless tobacco: Never   Tobacco comments:     08-28-2019 "i smoked 2 cigarettes in the last month since my father  passed"  Vaping Use   Vaping Use: Never used  Substance and Sexual Activity   Alcohol use: Yes    Alcohol/week: 10.0 standard drinks    Types: 10 Glasses of wine per week   Drug use: No   Sexual activity: Not on file  Other Topics Concern   Not on file  Social History Narrative   Not on file   Social Determinants of Health   Financial Resource Strain: Not on file  Food Insecurity: Not on file  Transportation Needs: Not on file   Physical Activity: Not on file  Stress: Not on file  Social Connections: Not on file  Intimate Partner Violence: Not on file    Past Medical History, Surgical history, Social history, and Family history were reviewed and updated as appropriate.   Please see review of systems for further details on the patient's review from today.   Objective:   Physical  Exam:  There were no vitals taken for this visit.  Physical Exam Constitutional:      General: She is not in acute distress. Neurological:     Mental Status: She is alert and oriented to person, place, and time.     Coordination: Coordination normal.     Gait: Gait normal.  Psychiatric:        Attention and Perception: Perception normal. She is attentive.        Mood and Affect: Mood is anxious and depressed. Affect is blunt. Affect is not labile, angry, tearful or inappropriate.        Speech: Speech is not rapid and pressured, slurred or tangential.        Behavior: Behavior is not agitated, slowed or aggressive.        Thought Content: Thought content is not paranoid or delusional. Thought content does not include homicidal or suicidal ideation. Thought content does not include suicidal plan.        Cognition and Memory: Cognition normal.        Judgment: Judgment normal.     Comments: Insight intact. No auditory or visual hallucinations. No delusions.  Depression  ongoing. More anxious  No Sui intent plan     Lab Review:     Component Value Date/Time   NA 137 01/12/2021 1430   NA 140 11/18/2018 1544   K 3.8 01/12/2021 1430   CL 108 01/12/2021 1430   CO2 22 01/12/2021 1430   GLUCOSE 94 01/12/2021 1430   BUN 14 01/12/2021 1430   BUN 20 11/18/2018 1544   CREATININE 0.82 01/12/2021 1430   CALCIUM 8.9 01/12/2021 1430   PROT 6.6 01/12/2021 1430   ALBUMIN 3.9 01/12/2021 1430   AST 12 (L) 01/12/2021 1430   ALT 11 01/12/2021 1430   ALKPHOS 46 01/12/2021 1430   BILITOT 0.5 01/12/2021 1430   GFRNONAA >60 01/12/2021  1430   GFRAA >60 09/02/2019 0249   GFRAA >60 01/27/2019 0811       Component Value Date/Time   WBC 4.5 01/12/2021 1430   RBC 4.32 01/12/2021 1430   HGB 12.8 01/12/2021 1430   HGB 12.9 07/17/2019 0953   HCT 38.5 01/12/2021 1430   HCT 21.9 (L) 12/25/2018 1221   PLT 272 01/12/2021 1430   PLT 286 07/17/2019 0953   MCV 89.1 01/12/2021 1430   MCH 29.6 01/12/2021 1430   MCHC 33.2 01/12/2021 1430   RDW 12.4 01/12/2021 1430   LYMPHSABS 1.4 01/12/2021 1430   MONOABS 0.4 01/12/2021 1430   EOSABS 0.0 01/12/2021 1430   BASOSABS 0.0 01/12/2021 1430    No results found for: POCLITH, LITHIUM   No results found for: PHENYTOIN, PHENOBARB, VALPROATE, CBMZ   .res Assessment: Plan:    Recurrent major depression resistant to treatment ([place])  Generalized anxiety disorder  Attention deficit hyperactivity disorder (ADHD), predominantly inattentive type  Insomnia due to mental condition  Accelerated hypertension   She has treatment resistant major depression at this time.  We discussed some of her recent abnormal behaviors leading to this depressive episode getting worse which she says were associated with heavy use of delta 8 and not a manic episode.  She realizes now that that was not good for her.  She stopped all use of other drugs including those available over-the-counter such as delta 8 or any other THC related products.  She is no longer having any of those types of behaviors and instead is depressed. She remains persistently depressed with lack  of interest and lack of feeling for things that normally she would have feelings about.  She has low motivation and energy.  She is sad and down.  She is less productive than usual.  Her concentration is poor.  She has high degree of anxiety as well. She has a very negative self-image and low self-esteem.  These are all uncharacteristic for her  Failed multiple antidepressants.  She wants to pursue Spravato.  We discussed discussed the side  effects in detail as well as the protocol required to receive Spravato.  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  When she stopped it she could not sleep.  We may consider alternative depression medications such as Auvelity  Patient was administered Spravato 84 mg intranasally today.  The patient experienced the typical dissociation which gradually resolved over the 2-hour period of observation.  There were no complications.  Specifically the patient did not have nausea or vomiting or headache.  Blood pressures remained within normal ranges at the 40-minute and 2-hour follow-up intervals.  By the time the 2-hour observation period was met the patient was alert and oriented and able to exit without assistance.  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  See nursing note for further details. BP ok today  Unfortunately she remains depressed.  Discussed alternatives.  Adderall to XR 20 mg AM Clonidine increased to 0.2 mg twice daily. 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  But she is ambivalent about the medicine Ok temporary Ativan 1 mg 3 times daily as needed anxiety She needs to continue to have a conversation with primary care doctor about her blood pressure medicines.  She was prescribed Bystolic but is taking it intermittently.  Take Bystolic consistently per PCP  Discussed potential benefits, risks, and side effects of stimulants with patient to include increased heart rate, palpitations, insomnia, increased anxiety, increased irritability, or decreased appetite.  Instructed patient to contact office if experiencing any significant tolerability issues. She wants to return to usual dose of  Adderall for ADD bc of mor poor cognitive function with reduction.  Also discussed that depression will impair cognitive function.  Discussed safety plan at length with patient.  Advised patient to contact office with any worsening signs and symptoms.  Instructed patient to go to the [facility] emergency room for evaluation if experiencing any acute safety concerns, to include suicidal intent.  Has Maintained sobriety  FU with Spravato twice weekly  Lynder Parents, MD, DFAPA     Please see After Visit Summary for patient specific instructions.  No future appointments.        No orders of the defined types were placed in this encounter.      -------------------------------

## 2022-04-04 ENCOUNTER — Encounter: Payer: Self-pay | Admitting: Psychiatry

## 2022-04-04 ENCOUNTER — Ambulatory Visit: Payer: 59

## 2022-04-04 ENCOUNTER — Ambulatory Visit (INDEPENDENT_AMBULATORY_CARE_PROVIDER_SITE_OTHER): Payer: 59 | Admitting: Psychiatry

## 2022-04-04 VITALS — BP 141/86 | HR 48

## 2022-04-04 DIAGNOSIS — F5105 Insomnia due to other mental disorder: Secondary | ICD-10-CM | POA: Diagnosis not present

## 2022-04-04 DIAGNOSIS — F900 Attention-deficit hyperactivity disorder, predominantly inattentive type: Secondary | ICD-10-CM

## 2022-04-04 DIAGNOSIS — F339 Major depressive disorder, recurrent, unspecified: Secondary | ICD-10-CM | POA: Diagnosis not present

## 2022-04-04 DIAGNOSIS — I10 Essential (primary) hypertension: Secondary | ICD-10-CM

## 2022-04-04 DIAGNOSIS — F411 Generalized anxiety disorder: Secondary | ICD-10-CM | POA: Diagnosis not present

## 2022-04-04 NOTE — Progress Notes (Unsigned)
Laura Chang [id number] 1956/08/01 66 y.o.  Subjective:   Patient ID:  Laura Chang is a 66 y.o. (DOB Feb 15, 1956) female.  Chief Complaint:  Chief Complaint  Patient presents with   Follow-up   Depression   Anxiety   Fatigue   ADD     HPI Laura Chang presents to the office today for follow-up of depression and anxiety and ADD.  seen November 12, 2019.  Melted down in 2020.  Went to [employer] in July.  No withdrawal.  1 drink since then.  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 She was on no stimulant and no SSRI but was taking Strattera and Wellbutrin.  The following changes were made. Stop Strattera. OK restart stimulant bc severe ADD Restart Adderall 1 daily for a few days and if tolerated then restart 1 twice daily. If not tolerated reduce the dosage if needed. May need to stop Wellbutrin if not tolerating the stimulant.  Yes.  DC Wellbutrin Restart Prozac 20 mg daily.  February 2021 appointment with the following noted: Completed grant proposal.  Couldn't doit without Adderall.  Sold a bunch of work.   Adderall XR lasts about 3 pm.  Strength seems about right.  BP been OK.  Not jittery.   Stopped Wellbutrin but had no SE. Mood drastically better with grant proposal and back on fluoxetine.  Less depressed and lethargic.  No anxiety.  Cut back on coffee. Started back with devotions and stronger faith. Plan: Continue Prozac 20 mg daily. May have to increase the dose at some point in the future given that she usually was taking higher dosages but she is getting good response at this time. Restart Wellbutrin off label for ADD since can't get 2 ADDERALL daily. 150 mg daily then 300 mg daily. She can adjust the dose between 150 mg and 300 mg daily to get the optimal effect.   05/11/2020 appointment with the following noted: Has been inconsistent with Prozac and Wellbutrin. Not sure of the effect of Wellbutrin. Biggest deterrent in work is  anxiety.  Some of the work is conceptual and difficult at times.  Can feel she's not up to a project at times.  Overall is OK but would like a steadier benefit from stimulant.  Exhausted from managing concentration and keeping up with things from the day.  Loses things.  Not good keeping up with schedule. Overall productive and emotionally OK. Can feel Adderall wear off. Mood is better in summer and worse in the winter.   F died in 2023-10-04 and that is a loss. No SE Wellbutrin. Still attends AA meetings.  Real benefit from [place] last year. Recognizes effect of anemia on ADD and mood.  Had iron infusions last winter. Plan:  Wellbutrin off label for ADD since can't get 2 ADDERALL daily. 150 mg daily then 300 mg daily.  01/24/2021 appointment with following noted: Doing a program called [employer] mindfulness app since Xmas.  CBT app helped the depression.  App helped her focus better.  Lost sign weight. Writing a lot. Before Xmas felt depressed and started negative thinking worse, self denigrating. Not drinking. More isolated.   Recognizes mo is narcissist.    Didn't tell anyone she was born until 3 mos later.  M aloof and uninterested in pt.  Lied about her birthday.  Mo lack of affection even with pt's kids. Going to [employer] for a year and it helped her to quit drinking. Also misses  kids being gone with a hole also. Plan: No med changes  05/04/2021 appointment with the following noted: Therapist Paula Pile thinks she's manic. Lost weight to 144#.   States she is still sleeping okay.  Admits she is hyper and recognizes that she is likely manic.  She feels great, euphoric with an increased sense of spiritual connectedness to God.  She has racing thoughts and talks fast and talks a lot and this is noted by her husband.  He thinks she is a bit hyper.  She has been able to maintain sobriety although she will have 1 glass of wine on special occasions but does not drink by herself.  She is not  drinking to excess.  She denies any dangerous impulsivity.  She is clearly not depressed and not particularly anxious.  She has no concerns about her medication and she has been compliant.  06/16/21 appt noted: So much better.  Going through a lot but the manic thing happened on top of it.  So much slower.  Didn't feel like losing anything with risperidone.  Likes the Adderalll at 10 mg. Some drowsiness in the AM and very drowsy from risperidone 2 mg HS. Prayer life is better. Handling stress better. Less depressed with risperidone. Still likes trazodone. Sleeps well. Plan: Reduce Prozac to 10 mg daily.  Consider stopping it because it can feel the mania however she is reluctant to do that because she fears relapse of depression. Reduce risperidone to 1.5 mg nightly due to side effects.  Discussed risk of worsening mania.  07/25/2021 appointment with the following noted: Misses the Adderall and hard to function without it. Depressed now. Heavy chest.  Anxious and guilty.  Body feels heavy.   Hates Wellbutrin.   Plan: Increase fluoxetine to 20 mg daily Add Abilify 1/2 of 15 mg tablet daily Wean wellbutrin by 1 tablet each week  bc she feels it is not helpful and DT polypharmacy Reduce risperidone to 1 daily for 1 week and stop it. Disc risk of mania. Increase Adderall to XR 20 mg AM  08/08/21 Much less depressed and starting to feel normal I feel a lot better. No SE.  Speech normal off risperidone. Sleeping OK on trazaodone and enough.   Noticed benefit from Adderall again. Plan: continue fluoxetine to 20 mg daily Continue Abilify 1/2 of 15 mg tablet daily for depression and mania continue Adderall to XR 20 mg AM  10/10/2021 phone call: Pt stated she feels like the Abilify should be decreased to 5mg.She said she is depressed but rational and not suicidal.She has an appt Monday and can wait until then if you prefer. MD response: Reduce the Abilify to 7.5 mg every other day.  We will meet on  10/16/2021 and decide what to do from there.  10/16/2021 appointment with the following noted: More depressed.  Most depressed I've ever been.  Just numb.  Sense of grief.   Thinks the manic episode was unlike anything else she ever had.  Doesn't want to medicate against it.  Don't enjoy people.  Easily overwhelmed.  Had some death thoughts but not suicidal.  Has been functional.  Feels better today after reducing Abilify to every other day but she is only been doing that for 3 days. A/P: Episode of post manic depression was explained. continue fluoxetine to 20 mg daily Hold Abilify for 1 week then resume Abilify 1/2 of 15 mg tablet every other day for depression and mania continue Adderall to XR 20 mg   AM  10/27/2021 appointment with the following noted: I'm doing so much better.  Handling the depression better. Better self talk and spiritual focus has helped.   Dep 6/10 manifesting as anxiety with low confidence.   F died 2  years ago and M 66 yo and is dependent . She is working hard to feel better but still feels depressed.  She almost feels like she has a little more anxiety since restarting Abilify every other day. Plan: continue fluoxetine to 20 mg daily DC Abilify .  Vrayalar 1.5 mg QOD to try to get rid of depression ASAP. continue Adderall to XR 20 mg AM  11/10/2021 appointment with the following noted: Busy with Xmas and it was fun with family but then a big let down.  Did well with it.  Functioned well with it.  Working hard on things with depression.  Not shutting down. Not sure but feels better today but yesterday was hard.  Difficulty dealing with mother.  She won't do anything to help herself.  Yesterday with her all day.  Won't do PT and has isolated herself.    Lack of confidence.   No SE with Vraylar.  11/24/21 urgent appointment appt noted: More and more depressed.   So anxious and doesn't want to be alone but can do so. No appetite. Hurts inside. Has had some fleeting  suicidal thoughts but would not act on them.  Tolerating meds. Has been consistent with Vraylar 1.5 mg every other day, fluoxetine 20 mg daily Plan: Increase Vraylar to 1.5 mg daily Change Prozac to Trintellix 10 mg daily. Discussed side effects of each continue Adderall to XR 20 mg AM  12/27/2021 appointment with the following noted: Not OK.  I feel less depressed but feels bat shit. Not sleeping well.  Extremely anxious. Off and on sleep. 3-4 hours of sleep.   Still having daily SI.  But also become obvious has so much to do.  Overwhelmed by tasks.   Needs anxiety meds to just function. Not more motivated.  Walked yesterday.   Feels afraid like in trouble but not irritable or angry. DC DT agitation Vraylar to 1.5 mg daily Change Prozac to Trintellix 10 mg daily. Hold Adderall to XR 20 mg AM Clonidine 0.1 1/2 tablet twice daily for 2 days and if needed for anxiety and sleep increase to 1 twice daily Ok temporary Ativan 1 mg 3 times daily as needed anxiety  01/05/22 appt noted: Off fluoxetine and  Trintellix.  Only on Ativan, trazodone and Adderall XR 20 plus added clonidine 0.1 mg BID Didn't think she needed to start Trintellix. Not taking Ativan.   Didn't like herself last week. Feels some better today. Wonders if the manic sx Not agitated.  Anxiety kind of calmed down.  A lot to be anxious about situationally.  $ stress. Concerns about downers with meds. Can't access normal personality. ? Lethargy and inability to talk as sE. Plan: Latuda 20-40 mg daily with food. Adderall to XR 20 mg AM Clonidine 0.1 1/2 tablet twice daily  reduce dose to be sure no SE Ok temporary Ativan 1 mg 3 times daily as needed anxiety  01/19/22 appt noted: Taking Latuda 20 mg daily.  Took 40 mg once and felt anxious and  SI Still depressed and not very reactive Anxiety mainly about the depression and fears of the future. She wants to revisit manic sx and thinks it was maybe bc taking delta 8 bc was  taking a lot of   it so still doesn't think she's classic bipolar. She wants to only take Prozac bc thinks Latuda is perpetuating depression. Says the delta 8 was very psychaedelic.  When not taking it was not manic.  Sleeping ok again.  Plan: Per her request DC Latuda 20-40 mg daily with food. She wants to continue Prozac alone AMA  Adderall to XR 20 mg AM Clonidine 0.1 1/2 tablet twice daily  reduce dose to be sure no SE Ok temporary Ativan 1 mg 3 times daily as needed anxiety  01/23/2022 phone call complaining of increased anxiety since stopping Latuda.  She will try increasing clonidine.  01/26/2022 phone call not feeling well and wanted to restart the Vraylar.  However notes indicate that had made her agitated therefore she was encouraged to pick up samples of Rexulti 1 mg and start that instead.  02/06/2022 phone call: Stating she felt the Rexulti was helping with depression but she was not sleeping well and obsessing over things.  She was encouraged to increase Rexulti to 2 mg daily and increase trazodone for sleep.  02/09/2022 appointment with the following noted: This was an urgent work in appointment No sleep last night with trazodone 100 mg HS Nothing really better depression or anxiety. Ruminating negative anxious thoughts. Did not tolerate Rexulti because it was causing insomnia.  Does not think it helped depression.  Lacks emotion that she should have.  Lacks her usual personality.  Some hopeless thoughts.  Some death thoughts.  Some suicidal thoughts without plan or intent Plan: DC Rexulti and Prozac & DC trazodone Adderall to XR 20 mg AM Clonidine 0.1 1/tablet twice daily  reduce dose to be sure no SE Ok temporary Ativan 1 mg 3 times daily as needed anxiety Start Seroquel XR 150 mg nightly  03/02/2022 appointment: Langley Gauss called back a few days after starting Seroquel stating it was making her more anxious and more depressed.  This seemed unlikely as this medicine rarely ever  causes anxiety.  She stopped the medication waited 3 days and called back still had anxiety and depression but thought perhaps the anxiety was a little better.  She did not want to take the Seroquel. She knew about the option of Spravato and wanted to pursue that. Now questions whether to return to Seroquel while waiting to start Spravato bc feels just as bad without it and knows she didn't give it enough time to work.   MADRS 46  ECT-MADRS    Flowsheet Row Office Visit from 03/02/2022 in [facility]  MADRS Total Score 46      03/14/22 appt noted: Pt received Spravato 56 mg first dose today with some dissociative sx which were not severe.  She was anxious prior to the administration and felt better after receiving lorazepam 1 mg.  No NV, or HA. Wants to continue Spravato. Ongoing depression and desperate to feel better.  I'm not myself DT deprsssion which is most severe in recent history.  Anhedonia.  Low motivation.  Social avoidance. Continues to think all recent med trials are making her worse.  Sleep ok with Seroquel.  03/16/22 appt noted: Received Spravato 84 mg for the first time.  some dissociative sx which were not severe.  She was anxious prior to the administration and felt better after receiving lorazepam 1 mg.  No NV, or HA. Wants to continue Spravato.   Does not feel any better or different since the last appt.  Ongoing depression.  Ongoing depression and desperate to feel better.  I'm not myself DT deprsssion which is most severe in recent history.  Anhedonia.  Low motivation.  Social avoidance. Continues to think all recent med trials are making her worse.  Sleep ok with Seroquel.  Does not want to continue Seroquel for TRD.  03/20/2022 appointment noted: Came for Spravato administration today.  However blood pressure was significantly elevated approximately 180/115.  She was given lorazepam 1 mg and clonidine 0.2 mg to try to get it down. She states she regretted  stopping the Seroquel XR 300 mg tablets.  She now realizes it was helpful.  She did not sleep much at all last night.  She did not take the Adderall this morning. 2 to 3 hours after arrival blood pressure was still elevated at  170/110, 62 pulse.  For Spravato administration was canceled for today.  She admits to being anxious and depressed.  She is not suicidal.  She is highly motivated to receive the Spravato.  We discussed getting it tomorrow.  03/22/2022 appointment noted: Patient's blood pressure was never stable enough yesterday in order to get her in for Spravato administration.  She was encouraged to see her primary care doctor.  It is better today.  03/26/2022 appointment with the following noted: Blood pressure was better.  Saw her primary care doctor who started on oral Bystolic 2.5 mg daily. Received Spravato 84 mg today as scheduled.  Tolerated it well without nausea or vomiting headache or chest pain or palpitations.  Her blood pressure was borderline but manageable. She remains depressed and anxious.  She is ambivalent about the medicine and desperate to get to feel better.  Continues to have anhedonia and low energy and low motivation and reduced ability to do things.  Less social.  Not suicidal.  03/28/22 appt noted: Received Spravato 84 mg today as scheduled.  Tolerated it well without nausea or vomiting headache or chest pain or palpitations.  Her blood pressure was borderline but manageable. Has not seen any improvement so far.  Tolerating Seroquel.  Inconsistent with Bystolic and BP has been borderline high. Still depressed and anxious and anhedonia.  Low motivation, energy, productivity. Taking quetiapine and tolerating XR 300 mg nightly.  04/04/22 appt noted: Received Spravato 84 mg today as scheduled.  Tolerated it well without nausea or vomiting headache or chest pain or palpitations.  Her blood pressure was borderline but manageable. Has not seen any improvement so far.   Tolerating Seroquel.     Past Psychiatric Medication Trials: fluoxetine, duloxetine, Viibryd, lamotrigine, Pristiq, sertraline, citalopram,  Trintellix anxious and SI Adderall, Adderall XR, Vyvanse, Ritalin, Strattera low dose NR Lorazepam Trazodone Wellbutrin  Depakote,  lamotrigine cog complaints Lithium remotely Abilify 7.5  Vraylar 1.5 mg daily agitation and insomnia Rexulti insomnia Seroquel XR briefly  At visit November 12, 2019. We discussed Patient developed an increasingly severe alcohol dependence problem since her last visit in January.  She went to [facility] and has had no alcohol since then except 1 day.  She never abused stimulants but they took her off the stimulants at [employer] [facility].  Her ADD was markedly worse.  The Wellbutrin did not helped the ADD.   D history lamotrigine rash at 66 yo  Review of Systems:  Review of Systems  Constitutional:  Positive for fatigue.  Cardiovascular:  Negative for palpitations.  Musculoskeletal:  Positive for arthralgias, back pain and joint swelling.       SP hip surgery October 2020  Neurological:  Negative for dizziness.  Psychiatric/Behavioral:  Positive for dysphoric mood and sleep disturbance. Negative for agitation, behavioral problems, confusion, decreased concentration, hallucinations, self-injury and suicidal ideas. The patient is nervous/anxious. The patient is not hyperactive.    Medications: I have reviewed the patient's current medications.  Current Outpatient Medications  Medication Sig Dispense Refill   amphetamine-dextroamphetamine (ADDERALL XR) 20 MG 24 hr capsule Take 1 capsule (20 mg total) by mouth every morning. 30 capsule 0   cloNIDine (CATAPRES) 0.1 MG tablet TAKE 1 TABLET BY MOUTH TWICE A DAY 60 tablet 0   cloNIDine (CATAPRES) 0.2 MG tablet Take 1 tablet (0.2 mg total) by mouth 2 (two) times daily. 60 tablet 0   Esketamine HCl, 84 MG Dose, (SPRAVATO, 84 MG DOSE,) 28 MG/DEVICE SOPK Administer 84 mg  ( 3 of the 28 mg devices) intranasally as directed twice a week 3 each 5   iron polysaccharides (NIFEREX) 150 MG capsule TAKE 1 CAPSULE BY MOUTH EVERY DAY 30 capsule 1   LORazepam (ATIVAN) 1 MG tablet Take 1 tablet (1 mg total) by mouth every 8 (eight) hours. 90 tablet 0   nebivolol (BYSTOLIC) 2.5 MG tablet Take 2.5 mg by mouth daily.     QUEtiapine (SEROQUEL XR) 300 MG 24 hr tablet Take 1 tablet (300 mg total) by mouth at bedtime. 30 tablet 0   No current facility-administered medications for this visit.    Medication Side Effects: None  Allergies:  Allergies  Allergen Reactions   Metronidazole Shortness Of Breath and Other (See Comments)    Heart pounding   Ferrlecit [Na Ferric Gluc Cplx In Sucrose] Other (See Comments)    Infusion reaction 05/12/2019    Past Medical History:  Diagnosis Date   ADHD    Anemia    Anxiety    Arthritis    Depression    Heart murmur    i went to see a cardiologit slast eyar  and i had zero plaque,    PONV (postoperative nausea and vomiting)    Recovering alcoholic in remission ([place])     Family History  Problem Relation Age of Onset   Atrial fibrillation Mother    CAD Father     Social History   Socioeconomic History   Marital status: Married    Spouse name: Not on file   Number of children: Not on file   Years of education: Not on file   Highest education level: Not on file  Occupational History   Not on file  Tobacco Use   Smoking status: Former    Types: Cigarettes    Quit date: 08/16/2003    Years since quitting: 18.6   Smokeless tobacco: Never   Tobacco comments:     08-28-2019 "i smoked 2 cigarettes in the last month since my father  passed"  Vaping Use   Vaping Use: Never used  Substance and Sexual Activity   Alcohol use: Yes    Alcohol/week: 10.0 standard drinks    Types: 10 Glasses of wine per week   Drug use: No   Sexual activity: Not on file  Other Topics Concern   Not on file  Social History Narrative   Not on  file   Social Determinants of Health   Financial Resource Strain: Not on file  Food Insecurity: Not on file  Transportation Needs: Not on file  Physical Activity: Not on file  Stress: Not on file  Social Connections: Not on file  Intimate Partner Violence: Not on file    Past Medical History, Surgical  history, Social history, and Family history were reviewed and updated as appropriate.   Please see review of systems for further details on the patient's review from today.   Objective:   Physical Exam:  There were no vitals taken for this visit.  Physical Exam Constitutional:      General: She is not in acute distress. Neurological:     Mental Status: She is alert and oriented to person, place, and time.     Coordination: Coordination normal.     Gait: Gait normal.  Psychiatric:        Attention and Perception: Perception normal. She is attentive.        Mood and Affect: Mood is anxious and depressed. Affect is blunt. Affect is not labile, angry, tearful or inappropriate.        Speech: Speech is not rapid and pressured or slurred.        Behavior: Behavior is not slowed or aggressive.        Thought Content: Thought content is not paranoid or delusional. Thought content does not include homicidal or suicidal ideation. Thought content does not include suicidal plan.        Cognition and Memory: Cognition normal.        Judgment: Judgment normal.     Comments: Insight intact. No auditory or visual hallucinations. No delusions.  Depression  ongoing. More anxious  No Sui intent plan     Lab Review:     Component Value Date/Time   NA 137 01/12/2021 1430   NA 140 11/18/2018 1544   K 3.8 01/12/2021 1430   CL 108 01/12/2021 1430   CO2 22 01/12/2021 1430   GLUCOSE 94 01/12/2021 1430   BUN 14 01/12/2021 1430   BUN 20 11/18/2018 1544   CREATININE 0.82 01/12/2021 1430   CALCIUM 8.9 01/12/2021 1430   PROT 6.6 01/12/2021 1430   ALBUMIN 3.9 01/12/2021 1430   AST 12 (L)  01/12/2021 1430   ALT 11 01/12/2021 1430   ALKPHOS 46 01/12/2021 1430   BILITOT 0.5 01/12/2021 1430   GFRNONAA >60 01/12/2021 1430   GFRAA >60 09/02/2019 0249   GFRAA >60 01/27/2019 0811       Component Value Date/Time   WBC 4.5 01/12/2021 1430   RBC 4.32 01/12/2021 1430   HGB 12.8 01/12/2021 1430   HGB 12.9 07/17/2019 0953   HCT 38.5 01/12/2021 1430   HCT 21.9 (L) 12/25/2018 1221   PLT 272 01/12/2021 1430   PLT 286 07/17/2019 0953   MCV 89.1 01/12/2021 1430   MCH 29.6 01/12/2021 1430   MCHC 33.2 01/12/2021 1430   RDW 12.4 01/12/2021 1430   LYMPHSABS 1.4 01/12/2021 1430   MONOABS 0.4 01/12/2021 1430   EOSABS 0.0 01/12/2021 1430   BASOSABS 0.0 01/12/2021 1430    No results found for: POCLITH, LITHIUM   No results found for: PHENYTOIN, PHENOBARB, VALPROATE, CBMZ   .res Assessment: Plan:    Recurrent major depression resistant to treatment ([place])  Generalized anxiety disorder  Attention deficit hyperactivity disorder (ADHD), predominantly inattentive type  Insomnia due to mental condition  Accelerated hypertension   She has treatment resistant major depression at this time.  We discussed some of her recent abnormal behaviors leading to this depressive episode getting worse which she says were associated with heavy use of delta 8 and not a manic episode.  She realizes now that that was not good for her.  She stopped all use of other drugs including those available over-the-counter  such as delta 8 or any other THC related products.  She is no longer having any of those types of behaviors and instead is depressed. She remains persistently depressed with lack of interest and lack of feeling for things that normally she would have feelings about.  She has low motivation and energy.  She is sad and down.  She is less productive than usual.  Her concentration is poor.  She has high degree of anxiety as well. She has a very negative self-image and low self-esteem.  These are all  uncharacteristic for her  Failed multiple antidepressants.  She wants to continue Spravato.  We discussed discussed the side effects in detail as well as the protocol required to receive Spravato.  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  When she stopped it she could not sleep.  We may consider alternative depression medications such as Auvelity once anxiety is better managed.  Today 5th administration of Spravato 84 mg but missed a dose DT hypertension. Patient was administered Spravato 84 mg intranasally today.  The patient experienced the typical dissociation which gradually resolved over the 2-hour period of observation.  There were no complications.  Specifically the patient did not have nausea or vomiting or headache.  Blood pressures remained within normal ranges at the 40-minute and 2-hour follow-up intervals.  By the time the 2-hour observation period was met the patient was alert and oriented and able to exit without assistance.  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  See nursing note for further details. BP ok today  Unfortunately she remains depressed.  Discussed alternatives.  Adderall to XR 20 mg AM Clonidine increased to 0.2 mg twice daily. 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  But she is ambivalent about the medicine Ok temporary Ativan 1 mg 3 times daily as needed anxiety She needs to continue to have a conversation with primary care doctor about her blood pressure medicines.  She was prescribed Bystolic but is taking it intermittently.  Take Bystolic consistently per PCP  Discussed potential benefits, risks, and side effects of stimulants with patient to include  increased heart rate, palpitations, insomnia, increased anxiety, increased irritability, or decreased appetite.  Instructed patient to contact office if experiencing any significant tolerability issues. She wants to return to usual dose of Adderall for ADD bc of mor poor cognitive function with reduction.  Also discussed that depression will impair cognitive function.  Discussed safety plan at length with patient.  Advised patient to contact office with any worsening signs and symptoms.  Instructed patient to go to the [facility] emergency room for evaluation if experiencing any acute safety concerns, to include suicidal intent.  Has Maintained sobriety  FU with Spravato twice weekly  Lynder Parents, MD, DFAPA     Please see After Visit Summary for patient specific instructions.  No future appointments.        No orders of the defined types were placed in this encounter.      -------------------------------

## 2022-04-05 ENCOUNTER — Telehealth: Payer: Self-pay

## 2022-04-05 NOTE — Telephone Encounter (Signed)
Received call from pt reporting she is having an especially hard day, very anxious. Reports she is almost out of Ativan as well.  Is she normally this anxious all the time? Every time I have contact with her she is very anxious. She is asking if this is "normal" at this stage.

## 2022-04-05 NOTE — Telephone Encounter (Signed)
Rtc to pt and gave her Dr. Alwyn Ren recommendations, not sure she is willing to increase her Seroquel. I did contact pharmacy and she was due for her new Rx of Lorazepam and they will have it ready today. Pt aware and very thankful. Her last refill 03/09/22 #60 she now has an increase.

## 2022-04-05 NOTE — Telephone Encounter (Signed)
Anxiety has been a large component of her current depression.  She was not anxious all the time like this before the current depression.  You can ask her if she is willing to go up in the Seroquel by adding 1/2 tablet until we talk about her meds again next week.  That could help anxierty and depression.    She can certainly continue the lorazepam in the meantime.  She is generally resistant to the Seroquel so she may not agree to increase it..  I do want to make other med changes until I see her next week.  She is very apprehensive against medications and I do not want to prescribe something new without being able to talk to her about it thoroughly in person.

## 2022-04-06 NOTE — Progress Notes (Signed)
Nurse visit:   Patient arrived for her 5th Spravato treatment. Pt is being treated for Treatment Resistant Depression, pt will be receiving 84 mg which will continue to be her maintenance dose. Patient arrived and taken to treatment room.  Answered any questions and concerns the patient had since her last treatment and what to expect today. Confirmed she had a ride home which is her husband would be coming back to pick up pt when done.  Pt's Spravato is ordered through [employer] and delivered to office, all Spravato medication is stored at doctors office per REMS/FDA guidelines. The medication is required to be locked behind two doors per FDA/REMS Protocol. Medication is also disposed of properly per regulations.      Began taking patient's vital signs at 9:40 AM 168/97, pulse 51. Rechecked in left arm 167/95, pulse 49. Due to elevated blood pressure, gave pt Clonidine 0.1 mg at 10:00 AM. Reassessed at 11:02 AM 136/82, pulse 45. Pt is now stable to receive her Spravato treatment. Instructed patient to blow her nose then recline back to a 45 degree angle. Gave patient first dose 28 mg nasal spray, patient is still trying to get the hang of using the nasal sprays, each nasal spray administered in each nostril as directed and waited 5 minutes between the second and third dose. After all 3 doses given pt did not complain of any nausea/vomiting, given a cup of water due to the taste after the administration of Spravato. Patient doing well, and is experiencing slight dissociation. Checked 40 minute vitals at 11:45 AM, 149/86, pulse 44.  No complaints. Explained she would be monitored for a total time of 120 minutes. Discharge vitals were taken at 1:07 PM 139/82, P 51. Dr. Clovis Pu did come visit with patient and discuss how treatment went at the end and discussed her elevated b/p issues. I walked pt to elevator, where her husband met her for the ride home. Recommend she go home and sleep or just relax on the  couch. No driving, no intense activities. Verbalized understanding. Pt. will be receiving 2 treatments per week for 4 weeks as recommended. Then reassessed to go down to 1 treatment weekly if pt is stable at that time. Nurse was with pt a total of 60 minutes for clinical assessment. Pt is scheduled Monday, May 22nd. Pt instructed to call office with any problems or questions.      LOT [id number] EXP [id number]

## 2022-04-06 NOTE — Progress Notes (Deleted)
Nurse visit:   Patient arrived for arrived for her 4th Spravato Treatment.  Pt is being treated for Treatment Resistant Depression, pt tolerated the 84 mg Spravato dose on 03/16/2022, no side effects so will continue the maintenance dose of 84 mg. Patient arrived and taken to treatment room.  Answered any questions and concerns the patient had since her last treatment and what to expect today. Confirmed she had a ride home which is her husband would be coming back to pick up pt when done.  Pt's Spravato is ordered through local [facility] and delivered to office, all Spravato medication is stored at doctors office per REMS/FDA guidelines. The medication is required to be locked behind two doors per FDA/REMS Protocol. Medication is also disposed of properly per regulations.      Began taking patient's vital signs at 10:30 AM 134/81, pulse 83. Instructed patient to blow her nose then recline back to a 45 degree angle. Gave patient first dose 28 mg nasal spray, patient is still trying to get the hang of using the nasal sprays, each nasal spray administered in each nostril as directed and waited 5 minutes between the second and third dose. After all 3 doses given pt did not complain of any nausea/vomiting, given a cup of water due to the taste after the administration of Spravato. Patient doing well, and is experiencing slight dissociation. Checked 40 minute vitals at 11:05 AM, 115/70, pulse 61.  No complaints. Explained she would be monitored for a total time of 120 minutes. Discharge vitals were taken at 12:20 PM 127/78, P 55. Dr. Cottle did come visit with patient and discuss how treatment went at the end. I walked pt to elevator, where her husband met her for the ride home. Recommend she go home and sleep or just relax on the couch. No driving, no intense activities. Verbalized understanding. Pt. will be receiving 2 treatments per week for 4 weeks as recommended. Then reassessed to go down to 1 treatment  weekly if pt is stable at that time. Nurse was with pt a total of 60 minutes for clinical assessment. With pt's elevated blood pressures at times she has not received treatments as directed. Pt is scheduled this Wednesday, May 17th. Pt instructed to call office with any problems or questions.      LOT [id number] EXP [id number] 

## 2022-04-06 NOTE — Progress Notes (Cosign Needed)
Nurse visit:   Patient arrived for arrived for her 4th Spravato Treatment.  Pt is being treated for Treatment Resistant Depression, pt tolerated the 84 mg Spravato dose on 03/16/2022, no side effects so will continue the maintenance dose of 84 mg. Patient arrived and taken to treatment room.  Answered any questions and concerns the patient had since her last treatment and what to expect today. Confirmed she had a ride home which is her husband would be coming back to pick up pt when done.  Pt's Spravato is ordered through local [facility] and delivered to office, all Spravato medication is stored at doctors office per REMS/FDA guidelines. The medication is required to be locked behind two doors per FDA/REMS Protocol. Medication is also disposed of properly per regulations.      Began taking patient's vital signs at 10:30 AM 134/81, pulse 83. Instructed patient to blow her nose then recline back to a 45 degree angle. Gave patient first dose 28 mg nasal spray, patient is still trying to get the hang of using the nasal sprays, each nasal spray administered in each nostril as directed and waited 5 minutes between the second and third dose. After all 3 doses given pt did not complain of any nausea/vomiting, given a cup of water due to the taste after the administration of Spravato. Patient doing well, and is experiencing slight dissociation. Checked 40 minute vitals at 11:05 AM, 115/70, pulse 61.  No complaints. Explained she would be monitored for a total time of 120 minutes. Discharge vitals were taken at 12:20 PM 127/78, P 55. Dr. Cottle did come visit with patient and discuss how treatment went at the end. I walked pt to elevator, where her husband met her for the ride home. Recommend she go home and sleep or just relax on the couch. No driving, no intense activities. Verbalized understanding. Pt. will be receiving 2 treatments per week for 4 weeks as recommended. Then reassessed to go down to 1 treatment  weekly if pt is stable at that time. Nurse was with pt a total of 60 minutes for clinical assessment. With pt's elevated blood pressures at times she has not received treatments as directed. Pt is scheduled this Wednesday, May 17th. Pt instructed to call office with any problems or questions.      LOT [id number] EXP [id number] 

## 2022-04-06 NOTE — Progress Notes (Signed)
Nurse visit:   Patient arrived for her 46th Spravato treatment. Pt is being treated for Treatment Resistant Depression, pt will be receiving 84 mg which will continue to be her maintenance dose. Patient arrived and taken to treatment room.  Answered any questions and concerns the patient had since her last treatment and what to expect today. Confirmed she had a ride home which is her husband would be coming back to pick up pt when done.  Pt's Spravato is ordered through [employer] and delivered to office, all Spravato medication is stored at doctors office per REMS/FDA guidelines. The medication is required to be locked behind two doors per FDA/REMS Protocol. Medication is also disposed of properly per regulations.      Began taking patient's vital signs at 10:40 AM 140/80, pulse 59. Instructed patient to blow her nose then recline back to a 45 degree angle. Gave patient first dose 28 mg nasal spray, patient is still trying to get the hang of using the nasal sprays, each nasal spray administered in each nostril as directed and waited 5 minutes between the second and third dose. After all 3 doses given pt did not complain of any nausea/vomiting, given a cup of water due to the taste after the administration of Spravato. Patient doing well, and is experiencing slight dissociation. Checked 40 minute vitals at 11:15 AM, 120/73, pulse 50.  No complaints. Explained she would be monitored for a total time of 120 minutes. Discharge vitals were taken at 12:37 PM 127/81, P 44. Dr. Clovis Pu did come visit with patient and discuss how treatment went at the end. I walked pt to elevator, where her husband met her for the ride home. Recommend she go home and sleep or just relax on the couch. No driving, no intense activities. Verbalized understanding. Pt. will be receiving 2 treatments per week for 4 weeks as recommended. Then reassessed to go down to 1 treatment weekly if pt is stable at that time. Nurse was with pt a  total of 60 minutes for clinical assessment. Pt is scheduled Wednesday, May 24th. Pt instructed to call office with any problems or questions.      LOT [id number] EXP [id number]

## 2022-04-10 ENCOUNTER — Ambulatory Visit (INDEPENDENT_AMBULATORY_CARE_PROVIDER_SITE_OTHER): Payer: 59 | Admitting: Psychiatry

## 2022-04-10 ENCOUNTER — Other Ambulatory Visit: Payer: Self-pay | Admitting: Psychiatry

## 2022-04-10 ENCOUNTER — Ambulatory Visit: Payer: 59

## 2022-04-10 VITALS — BP 147/88 | HR 50

## 2022-04-10 DIAGNOSIS — F411 Generalized anxiety disorder: Secondary | ICD-10-CM | POA: Diagnosis not present

## 2022-04-10 DIAGNOSIS — F900 Attention-deficit hyperactivity disorder, predominantly inattentive type: Secondary | ICD-10-CM | POA: Diagnosis not present

## 2022-04-10 DIAGNOSIS — I10 Essential (primary) hypertension: Secondary | ICD-10-CM

## 2022-04-10 DIAGNOSIS — F339 Major depressive disorder, recurrent, unspecified: Secondary | ICD-10-CM

## 2022-04-10 DIAGNOSIS — F5105 Insomnia due to other mental disorder: Secondary | ICD-10-CM | POA: Diagnosis not present

## 2022-04-10 NOTE — Telephone Encounter (Signed)
Please submit.

## 2022-04-10 NOTE — Progress Notes (Signed)
Laura Chang [id number] 1956-05-11 66 y.o.  Subjective:   Patient ID:  Laura Chang is a 66 y.o. (DOB 03-18-1956) female.  Chief Complaint:  No chief complaint on file.    HPI Laura Chang presents to the office today for follow-up of depression and anxiety and ADD.  seen November 12, 2019.  Melted down in 2020.  Went to [employer] in July.  No withdrawal.  1 drink since then.  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 She was on no stimulant and no SSRI but was taking Strattera and Wellbutrin.  The following changes were made. Stop Strattera. OK restart stimulant bc severe ADD Restart Adderall 1 daily for a few days and if tolerated then restart 1 twice daily. If not tolerated reduce the dosage if needed. May need to stop Wellbutrin if not tolerating the stimulant.  Yes.  DC Wellbutrin Restart Prozac 20 mg daily.  February 2021 appointment with the following noted: Completed grant proposal.  Couldn't doit without Adderall.  Sold a bunch of work.   Adderall XR lasts about 3 pm.  Strength seems about right.  BP been OK.  Not jittery.   Stopped Wellbutrin but had no SE. Mood drastically better with grant proposal and back on fluoxetine.  Less depressed and lethargic.  No anxiety.  Cut back on coffee. Started back with devotions and stronger faith. Plan: Continue Prozac 20 mg daily. May have to increase the dose at some point in the future given that she usually was taking higher dosages but she is getting good response at this time. Restart Wellbutrin off label for ADD since can't get 2 ADDERALL daily. 150 mg daily then 300 mg daily. She can adjust the dose between 150 mg and 300 mg daily to get the optimal effect.   05/11/2020 appointment with the following noted: Has been inconsistent with Prozac and Wellbutrin. Not sure of the effect of Wellbutrin. Biggest deterrent in work is anxiety.  Some of the work is conceptual and difficult at times.   Can feel she's not up to a project at times.  Overall is OK but would like a steadier benefit from stimulant.  Exhausted from managing concentration and keeping up with things from the day.  Loses things.  Not good keeping up with schedule. Overall productive and emotionally OK. Can feel Adderall wear off. Mood is better in summer and worse in the winter.   F died in 2023-11-10 and that is a loss. No SE Wellbutrin. Still attends AA meetings.  Real benefit from [place] last year. Recognizes effect of anemia on ADD and mood.  Had iron infusions last winter. Plan:  Wellbutrin off label for ADD since can't get 2 ADDERALL daily. 150 mg daily then 300 mg daily.  01/24/2021 appointment with following noted: Doing a program called [employer] mindfulness app since Xmas.  CBT app helped the depression.  App helped her focus better.  Lost sign weight. Writing a lot. Before Xmas felt depressed and started negative thinking worse, self denigrating. Not drinking. More isolated.   Recognizes mo is narcissist.    Didn't tell anyone she was born until 3 mos later.  M aloof and uninterested in pt.  Lied about her birthday.  Mo lack of affection even with pt's kids. Going to [place] for a year and it helped her to quit drinking. Also misses kids being gone with a hole also. Plan: No med changes  05/04/2021 appointment with the following  noted: Therapist [name] thinks she's manic. Lost weight to 144#.   States she is still sleeping okay.  Admits she is hyper and recognizes that she is likely manic.  She feels great, euphoric with an increased sense of spiritual connectedness to God.  She has racing thoughts and talks fast and talks a lot and this is noted by her husband.  He thinks she is a bit hyper.  She has been able to maintain sobriety although she will have 1 glass of wine on special occasions but does not drink by herself.  She is not drinking to excess.  She denies any dangerous impulsivity.  She is  clearly not depressed and not particularly anxious.  She has no concerns about her medication and she has been compliant.  06/16/21 appt noted: So much better.  Going through a lot but the manic thing happened on top of it.  So much slower.  Didn't feel like losing anything with risperidone.  Likes the Adderalll at 10 mg. Some drowsiness in the AM and very drowsy from risperidone 2 mg HS. Prayer life is better. Handling stress better. Less depressed with risperidone. Still likes trazodone. Sleeps well. Plan: Reduce Prozac to 10 mg daily.  Consider stopping it because it can feel the mania however she is reluctant to do that because she fears relapse of depression. Reduce risperidone to 1.5 mg nightly due to side effects.  Discussed risk of worsening mania.  07/25/2021 appointment with the following noted: Misses the Adderall and hard to function without it. Depressed now. Heavy chest.  Anxious and guilty.  Body feels heavy.   Hates Wellbutrin.   Plan: Increase fluoxetine to 20 mg daily Add Abilify 1/2 of 15 mg tablet daily Wean wellbutrin by 1 tablet each week  bc she feels it is not helpful and DT polypharmacy Reduce risperidone to 1 daily for 1 week and stop it. Disc risk of mania. Increase Adderall to XR 20 mg AM  08/08/21 Much less depressed and starting to feel normal I feel a lot better. No SE.  Speech normal off risperidone. Sleeping OK on trazaodone and enough.   Noticed benefit from Adderall again. Plan: continue fluoxetine to 20 mg daily Continue Abilify 1/2 of 15 mg tablet daily for depression and mania continue Adderall to XR 20 mg AM  10/10/2021 phone call: Pt stated she feels like the Abilify should be decreased to 25m.She said she is depressed but rational and not suicidal.She has an appt Monday and can wait until then if you prefer. MD response: Reduce the Abilify to 7.5 mg every other day.  We will meet on 10/16/2021 and decide what to do from there.  10/16/2021  appointment with the following noted: More depressed.  Most depressed I've ever been.  Just numb.  Sense of grief.   Thinks the manic episode was unlike anything else she ever had.  Doesn't want to medicate against it.  Don't enjoy people.  Easily overwhelmed.  Had some death thoughts but not suicidal.  Has been functional.  Feels better today after reducing Abilify to every other day but she is only been doing that for 3 days. A/P: Episode of post manic depression was explained. continue fluoxetine to 20 mg daily Hold Abilify for 1 week then resume Abilify 1/2 of 15 mg tablet every other day for depression and mania continue Adderall to XR 20 mg AM  10/27/2021 appointment with the following noted: I'm doing so much better.  Handling the depression  better. Better self talk and spiritual focus has helped.   Dep 6/10 manifesting as anxiety with low confidence.   F died 2  years ago and M 66 yo and is dependent . She is working hard to feel better but still feels depressed.  She almost feels like she has a little more anxiety since restarting Abilify every other day. Plan: continue fluoxetine to 20 mg daily DC Abilify .  Vrayalar 1.5 mg QOD to try to get rid of depression ASAP. continue Adderall to XR 20 mg AM  11/10/2021 appointment with the following noted: Busy with Xmas and it was fun with family but then a big let down.  Did well with it.  Functioned well with it.  Working hard on things with depression.  Not shutting down. Not sure but feels better today but yesterday was hard.  Difficulty dealing with mother.  She won't do anything to help herself.  Yesterday with her all day.  Won't do PT and has isolated herself.    Lack of confidence.   No SE with Vraylar.  11/24/21 urgent appointment appt noted: More and more depressed.   So anxious and doesn't want to be alone but can do so. No appetite. Hurts inside. Has had some fleeting suicidal thoughts but would not act on them.  Tolerating  meds. Has been consistent with Vraylar 1.5 mg every other day, fluoxetine 20 mg daily Plan: Increase Vraylar to 1.5 mg daily Change Prozac to Trintellix 10 mg daily. Discussed side effects of each continue Adderall to XR 20 mg AM  12/27/2021 appointment with the following noted: Not OK.  I feel less depressed but feels bat shit. Not sleeping well.  Extremely anxious. Off and on sleep. 3-4 hours of sleep.   Still having daily SI.  But also become obvious has so much to do.  Overwhelmed by tasks.   Needs anxiety meds to just function. Not more motivated.  Walked yesterday.   Feels afraid like in trouble but not irritable or angry. DC DT agitation Vraylar to 1.5 mg daily Change Prozac to Trintellix 10 mg daily. Hold Adderall to XR 20 mg AM Clonidine 0.1 1/2 tablet twice daily for 2 days and if needed for anxiety and sleep increase to 1 twice daily Ok temporary Ativan 1 mg 3 times daily as needed anxiety  01/05/22 appt noted: Off fluoxetine and  Trintellix.  Only on Ativan, trazodone and Adderall XR 20 plus added clonidine 0.1 mg BID Didn't think she needed to start Trintellix. Not taking Ativan.   Didn't like herself last week. Feels some better today. Wonders if the manic sx Not agitated.  Anxiety kind of calmed down.  A lot to be anxious about situationally.  $ stress. Concerns about downers with meds. Can't access normal personality. ? Lethargy and inability to talk as sE. Plan: Latuda 20-40 mg daily with food. Adderall to XR 20 mg AM Clonidine 0.1 1/2 tablet twice daily  reduce dose to be sure no SE Ok temporary Ativan 1 mg 3 times daily as needed anxiety  01/19/22 appt noted: Taking Latuda 20 mg daily.  Took 40 mg once and felt anxious and  SI Still depressed and not very reactive Anxiety mainly about the depression and fears of the future. She wants to revisit manic sx and thinks it was maybe bc taking delta 8 bc was taking a lot of it so still doesn't think she's classic  bipolar. She wants to only take Prozac bc thinks [place]  is perpetuating depression. Says the delta 8 was very psychaedelic.  When not taking it was not manic.  Sleeping ok again.  Plan: Per her request DC Latuda 20-40 mg daily with food. She wants to continue Prozac alone AMA  Adderall to XR 20 mg AM Clonidine 0.1 1/2 tablet twice daily  reduce dose to be sure no SE Ok temporary Ativan 1 mg 3 times daily as needed anxiety  01/23/2022 phone call complaining of increased anxiety since stopping Latuda.  She will try increasing clonidine.  01/26/2022 phone call not feeling well and wanted to restart the Vraylar.  However notes indicate that had made her agitated therefore she was encouraged to pick up samples of Rexulti 1 mg and start that instead.  02/06/2022 phone call: Stating she felt the Rexulti was helping with depression but she was not sleeping well and obsessing over things.  She was encouraged to increase Rexulti to 2 mg daily and increase trazodone for sleep.  02/09/2022 appointment with the following noted: This was an urgent work in appointment No sleep last night with trazodone 100 mg HS Nothing really better depression or anxiety. Ruminating negative anxious thoughts. Did not tolerate Rexulti because it was causing insomnia.  Does not think it helped depression.  Lacks emotion that she should have.  Lacks her usual personality.  Some hopeless thoughts.  Some death thoughts.  Some suicidal thoughts without plan or intent Plan: DC Rexulti and Prozac & DC trazodone Adderall to XR 20 mg AM Clonidine 0.1 1/tablet twice daily  reduce dose to be sure no SE Ok temporary Ativan 1 mg 3 times daily as needed anxiety Start Seroquel XR 150 mg nightly  03/02/2022 appointment: Laura Chang called back a few days after starting Seroquel stating it was making her more anxious and more depressed.  This seemed unlikely as this medicine rarely ever causes anxiety.  She stopped the medication waited 3 days  and called back still had anxiety and depression but thought perhaps the anxiety was a little better.  She did not want to take the Seroquel. She knew about the option of Spravato and wanted to pursue that. Now questions whether to return to Seroquel while waiting to start Spravato bc feels just as bad without it and knows she didn't give it enough time to work.   MADRS 46  ECT-MADRS    Flowsheet Row Office Visit from 03/02/2022 in [facility]  MADRS Total Score 46      03/14/22 appt noted: Pt received Spravato 56 mg first dose today with some dissociative sx which were not severe.  She was anxious prior to the administration and felt better after receiving lorazepam 1 mg.  No NV, or HA. Wants to continue Spravato. Ongoing depression and desperate to feel better.  I'm not myself DT deprsssion which is most severe in recent history.  Anhedonia.  Low motivation.  Social avoidance. Continues to think all recent med trials are making her worse.  Sleep ok with Seroquel.  03/16/22 appt noted: Received Spravato 84 mg for the first time.  some dissociative sx which were not severe.  She was anxious prior to the administration and felt better after receiving lorazepam 1 mg.  No NV, or HA. Wants to continue Spravato.   Does not feel any better or different since the last appt.  Ongoing depression.  Ongoing depression and desperate to feel better.  I'm not myself DT deprsssion which is most severe in recent history.  Anhedonia.  Low  motivation.  Social avoidance. Continues to think all recent med trials are making her worse.  Sleep ok with Seroquel.  Does not want to continue Seroquel for TRD.  03/20/2022 appointment noted: Came for Spravato administration today.  However blood pressure was significantly elevated approximately 180/115.  She was given lorazepam 1 mg and clonidine 0.2 mg to try to get it down. She states she regretted stopping the Seroquel XR 300 mg tablets.  She now realizes  it was helpful.  She did not sleep much at all last night.  She did not take the Adderall this morning. 2 to 3 hours after arrival blood pressure was still elevated at  170/110, 62 pulse.  For Spravato administration was canceled for today.  She admits to being anxious and depressed.  She is not suicidal.  She is highly motivated to receive the Spravato.  We discussed getting it tomorrow.  03/22/2022 appointment noted: Patient's blood pressure was never stable enough yesterday in order to get her in for Spravato administration.  She was encouraged to see her primary care doctor.  It is better today.  03/26/2022 appointment with the following noted: Blood pressure was better.  Saw her primary care doctor who started on oral Bystolic 2.5 mg daily. Received Spravato 84 mg today as scheduled.  Tolerated it well without nausea or vomiting headache or chest pain or palpitations.  Her blood pressure was borderline but manageable. She remains depressed and anxious.  She is ambivalent about the medicine and desperate to get to feel better.  Continues to have anhedonia and low energy and low motivation and reduced ability to do things.  Less social.  Not suicidal.  03/28/22 appt noted: Received Spravato 84 mg today as scheduled.  Tolerated it well without nausea or vomiting headache or chest pain or palpitations.  Her blood pressure was borderline but manageable. Has not seen any improvement so far.  Tolerating Seroquel.  Inconsistent with Bystolic and BP has been borderline high. Still depressed and anxious and anhedonia.  Low motivation, energy, productivity. Taking quetiapine and tolerating XR 300 mg nightly.  04/04/22 appt noted: Received Spravato 84 mg today as scheduled.  Tolerated it well without nausea or vomiting headache or chest pain or palpitations.  Her blood pressure was borderline but manageable. Has not seen any improvement so far.  Tolerating Seroquel.   She still tends to think that the  medications are making her worse.  She has said this about each of the recent psychiatric medicines including Seroquel.  However her husband thinks she is improved.  She also admits there is some improvement in productivity.  She still feels highly anxious.  She still does not enjoy things as normal.  She still feels desperate to improve as soon as possible. Has been taking Seroquel XR since 03/20/2022  04/10/22 appt noted: Received Spravato 84 mg today as scheduled.  Tolerated it well without nausea or vomiting headache or chest pain or palpitations.  Her blood pressure was borderline but manageable. Has not seen any improvement so far.  Tolerating Seroquel.  Doesn't like Seroquel bc she thinks it flattens here.   Past Psychiatric Medication Trials: fluoxetine, duloxetine, Viibryd, lamotrigine, Pristiq, sertraline, citalopram,  Trintellix anxious and SI Wellbutrin  Adderall, Adderall XR, Vyvanse, Ritalin, Strattera low dose NR Lorazepam Trazodone  Depakote,  lamotrigine cog complaints Lithium remotely Abilify 7.5  Vraylar 1.5 mg daily agitation and insomnia Rexulti insomnia Latuda 40 one dose, CO anxious and SI Seroquel XR 300  At visit November 12, 2019. We discussed Patient developed an increasingly severe alcohol dependence problem since her last visit in January.  She went to [facility] and has had no alcohol since then except 1 day.  She never abused stimulants but they took her off the stimulants at [employer].  Her ADD was markedly worse.  The Wellbutrin did not help the ADD.   D history lamotrigine rash at 66 yo  Review of Systems:  Review of Systems  Constitutional:  Positive for fatigue.  Cardiovascular:  Negative for palpitations.  Musculoskeletal:  Positive for arthralgias, back pain and joint swelling.       SP hip surgery October 2020  Neurological:  Negative for dizziness.  Psychiatric/Behavioral:  Positive for decreased concentration, dysphoric mood and  sleep disturbance. Negative for agitation, behavioral problems, confusion, hallucinations, self-injury and suicidal ideas. The patient is nervous/anxious. The patient is not hyperactive.    Medications: I have reviewed the patient's current medications.  Current Outpatient Medications  Medication Sig Dispense Refill  . amphetamine-dextroamphetamine (ADDERALL XR) 20 MG 24 hr capsule Take 1 capsule (20 mg total) by mouth every morning. 30 capsule 0  . cloNIDine (CATAPRES) 0.1 MG tablet TAKE 1 TABLET BY MOUTH TWICE A DAY 60 tablet 0  . cloNIDine (CATAPRES) 0.2 MG tablet Take 1 tablet (0.2 mg total) by mouth 2 (two) times daily. 60 tablet 0  . Esketamine HCl, 84 MG Dose, (SPRAVATO, 84 MG DOSE,) 28 MG/DEVICE SOPK Administer 84 mg ( 3 of the 28 mg devices) intranasally as directed twice a week 3 each 5  . iron polysaccharides (NIFEREX) 150 MG capsule TAKE 1 CAPSULE BY MOUTH EVERY DAY 30 capsule 1  . LORazepam (ATIVAN) 1 MG tablet Take 1 tablet (1 mg total) by mouth every 8 (eight) hours. 90 tablet 0  . nebivolol (BYSTOLIC) 2.5 MG tablet Take 2.5 mg by mouth daily.    . QUEtiapine (SEROQUEL XR) 300 MG 24 hr tablet Take 1 tablet (300 mg total) by mouth at bedtime. 30 tablet 0   No current facility-administered medications for this visit.    Medication Side Effects: None  Allergies:  Allergies  Allergen Reactions  . Metronidazole Shortness Of Breath and Other (See Comments)    Heart pounding  . Ferrlecit [Na Ferric Gluc Cplx In Sucrose] Other (See Comments)    Infusion reaction 05/12/2019    Past Medical History:  Diagnosis Date  . ADHD   . Anemia   . Anxiety   . Arthritis   . Depression   . Heart murmur    i went to see a cardiologit slast eyar  and i had zero plaque,   . PONV (postoperative nausea and vomiting)   . Recovering alcoholic in remission [facility])     Family History  Problem Relation Age of Onset  . Atrial fibrillation Mother   . CAD Father     Social History    Socioeconomic History  . Marital status: Married    Spouse name: Not on file  . Number of children: Not on file  . Years of education: Not on file  . Highest education level: Not on file  Occupational History  . Not on file  Tobacco Use  . Smoking status: Former    Types: Cigarettes    Quit date: 08/16/2003    Years since quitting: 18.6  . Smokeless tobacco: Never  . Tobacco comments:     08-28-2019 "i smoked 2 cigarettes in the last month since my father  passed"  Vaping Use  . Vaping Use: Never used  Substance and Sexual Activity  . Alcohol use: Yes    Alcohol/week: 10.0 standard drinks    Types: 10 Glasses of wine per week  . Drug use: No  . Sexual activity: Not on file  Other Topics Concern  . Not on file  Social History Narrative  . Not on file   Social Determinants of Health   Financial Resource Strain: Not on file  Food Insecurity: Not on file  Transportation Needs: Not on file  Physical Activity: Not on file  Stress: Not on file  Social Connections: Not on file  Intimate Partner Violence: Not on file    Past Medical History, Surgical history, Social history, and Family history were reviewed and updated as appropriate.   Please see review of systems for further details on the patient's review from today.   Objective:   Physical Exam:  There were no vitals taken for this visit.  Physical Exam Constitutional:      General: She is not in acute distress. Neurological:     Mental Status: She is alert and oriented to person, place, and time.     Coordination: Coordination normal.     Gait: Gait normal.  Psychiatric:        Attention and Perception: Perception normal. She is attentive.        Mood and Affect: Mood is anxious and depressed. Affect is blunt. Affect is not labile, angry, tearful or inappropriate.        Speech: Speech is not rapid and pressured or slurred.        Behavior: Behavior is not slowed or aggressive.        Thought Content:  Thought content is not paranoid or delusional. Thought content does not include homicidal or suicidal ideation. Thought content does not include suicidal plan.        Cognition and Memory: Cognition normal.        Judgment: Judgment normal.     Comments: Insight intact. No auditory or visual hallucinations. No delusions.  Depression  ongoing. Affect largely unchanged and is still depressed and anxious No Sui intent plan     Lab Review:     Component Value Date/Time   NA 137 01/12/2021 1430   NA 140 11/18/2018 1544   K 3.8 01/12/2021 1430   CL 108 01/12/2021 1430   CO2 22 01/12/2021 1430   GLUCOSE 94 01/12/2021 1430   BUN 14 01/12/2021 1430   BUN 20 11/18/2018 1544   CREATININE 0.82 01/12/2021 1430   CALCIUM 8.9 01/12/2021 1430   PROT 6.6 01/12/2021 1430   ALBUMIN 3.9 01/12/2021 1430   AST 12 (L) 01/12/2021 1430   ALT 11 01/12/2021 1430   ALKPHOS 46 01/12/2021 1430   BILITOT 0.5 01/12/2021 1430   GFRNONAA >60 01/12/2021 1430   GFRAA >60 09/02/2019 0249   GFRAA >60 01/27/2019 0811       Component Value Date/Time   WBC 4.5 01/12/2021 1430   RBC 4.32 01/12/2021 1430   HGB 12.8 01/12/2021 1430   HGB 12.9 07/17/2019 0953   HCT 38.5 01/12/2021 1430   HCT 21.9 (L) 12/25/2018 1221   PLT 272 01/12/2021 1430   PLT 286 07/17/2019 0953   MCV 89.1 01/12/2021 1430   MCH 29.6 01/12/2021 1430   MCHC 33.2 01/12/2021 1430   RDW 12.4 01/12/2021 1430   LYMPHSABS 1.4 01/12/2021 1430   MONOABS 0.4 01/12/2021 1430   EOSABS 0.0 01/12/2021 1430  BASOSABS 0.0 01/12/2021 1430    No results found for: POCLITH, LITHIUM   No results found for: PHENYTOIN, PHENOBARB, VALPROATE, CBMZ   .res Assessment: Plan:    No diagnosis found.   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  We discussed some of her recent abnormal behaviors leading to this depressive episode getting worse which she says were associated with heavy use of delta 8 and not a manic episode.  She realizes  now that that was not good for her.  She stopped all use of other drugs including those available over-the-counter such as delta 8 or any other THC related products.  She is no longer having any of those types of behaviors and instead is depressed. She remains persistently depressed with lack of interest and lack of feeling for things that normally she would have feelings about.  She has low motivation and energy.  She is sad and down.  She is less productive than usual.  Her concentration is poor.  She has high degree of anxiety as well. She has a very negative self-image and low self-esteem.  These are all uncharacteristic for her  Failed multiple antidepressants.  Many of them were not actual failures but intolerances and it is unclear whether some of that was more connected with anxiety than true side effects.  1 example is the [place].  In general she does not want to try anything but an antidepressant but has failed all major categories of antidepressants except TCAs and MAO inhibitors which have not been tried.  Could consider a TCA but given her manic response to delta 8 there is some concern about the risk of a TCA triggering mania.  It could be combined with lithium but she generally is unfavorable about taking lithium.  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  She wants to continue Spravato.  We discussed discussed the side effects in detail as well as the protocol required to receive Spravato.  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  When she stopped it she could not sleep.  We may consider alternative depression medications such as Auvelity once anxiety is better managed.  Has been taking Seroquel XR since  03/20/2022  Today administration of Spravato 84 mg but missed a dose DT hypertension. Patient was administered Spravato 84 mg intranasally today.  The patient experienced the typical dissociation which gradually resolved over the 2-hour period of observation.  There were no complications.  Specifically the patient did not have nausea or vomiting or headache.  Blood pressures remained within normal ranges at the 40-minute and 2-hour follow-up intervals.  By the time the 2-hour observation period was met the patient was alert and oriented and able to exit without assistance.  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  See nursing note for further details. BP ok today  Unfortunately she remains depressed.  Discussed alternatives.  Adderall to XR 20 mg AM Clonidine increased to 0.2 mg twice daily. 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  But she is ambivalent about the medicine Ok temporary Ativan 1 mg 3  times daily as needed anxiety She needs to continue to have a conversation with primary care doctor about her blood pressure medicines.  She was prescribed Bystolic but is taking it intermittently.  Take Bystolic consistently per PCP  Discussed potential benefits, risks, and side effects of stimulants with patient to include increased heart rate, palpitations, insomnia, increased anxiety, increased irritability, or decreased appetite.  Instructed patient to contact office if experiencing any significant tolerability issues. She wants to return to usual dose of Adderall for ADD bc of mor poor cognitive function with reduction.  Also discussed that depression will impair cognitive function.  Discussed safety plan at length with patient.  Advised patient to contact office with any worsening signs and symptoms.  Instructed patient to go to the  [facility] emergency room for evaluation if experiencing any acute safety concerns, to include suicidal intent.  Has Maintained sobriety  FU with Spravato twice weekly  Lynder Parents, MD, DFAPA     Please see After Visit Summary for patient specific instructions.  No future appointments.        No orders of the defined types were placed in this encounter.      -------------------------------

## 2022-04-11 ENCOUNTER — Other Ambulatory Visit: Payer: Self-pay | Admitting: Psychiatry

## 2022-04-12 ENCOUNTER — Encounter: Payer: Self-pay | Admitting: Psychiatry

## 2022-04-12 ENCOUNTER — Ambulatory Visit (INDEPENDENT_AMBULATORY_CARE_PROVIDER_SITE_OTHER): Payer: 59 | Admitting: Psychiatry

## 2022-04-12 ENCOUNTER — Ambulatory Visit: Payer: 59

## 2022-04-12 VITALS — BP 98/77 | HR 50

## 2022-04-12 DIAGNOSIS — F900 Attention-deficit hyperactivity disorder, predominantly inattentive type: Secondary | ICD-10-CM

## 2022-04-12 DIAGNOSIS — F339 Major depressive disorder, recurrent, unspecified: Secondary | ICD-10-CM

## 2022-04-12 DIAGNOSIS — F411 Generalized anxiety disorder: Secondary | ICD-10-CM

## 2022-04-12 DIAGNOSIS — I10 Essential (primary) hypertension: Secondary | ICD-10-CM

## 2022-04-12 DIAGNOSIS — F5105 Insomnia due to other mental disorder: Secondary | ICD-10-CM | POA: Diagnosis not present

## 2022-04-13 ENCOUNTER — Encounter: Payer: Self-pay | Admitting: Psychiatry

## 2022-04-13 NOTE — Progress Notes (Signed)
Laura Chang [id number] [birth year] 66 y.o.  Subjective:   Patient ID:  Laura Chang is a 66 y.o. (DOB 1956/04/10) female.  Chief Complaint:  Chief Complaint  Patient presents with   Follow-up   Depression   Fatigue   ADD     HPI Laura Chang presents to the office today for follow-up of depression and anxiety and ADD.  seen November 12, 2019.  Melted down in 2020.  Went to [employer] in July.  No withdrawal.  1 drink since then.  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 She was on no stimulant and no SSRI but was taking Strattera and Wellbutrin.  The following changes were made. Stop Strattera. OK restart stimulant bc severe ADD Restart Adderall 1 daily for a few days and if tolerated then restart 1 twice daily. If not tolerated reduce the dosage if needed. May need to stop Wellbutrin if not tolerating the stimulant.  Yes.  DC Wellbutrin Restart Prozac 20 mg daily.  February 2021 appointment with the following noted: Completed grant proposal.  Couldn't doit without Adderall.  Sold a bunch of work.   Adderall XR lasts about 3 pm.  Strength seems about right.  BP been OK.  Not jittery.   Stopped Wellbutrin but had no SE. Mood drastically better with grant proposal and back on fluoxetine.  Less depressed and lethargic.  No anxiety.  Cut back on coffee. Started back with devotions and stronger faith. Plan: Continue Prozac 20 mg daily. May have to increase the dose at some point in the future given that she usually was taking higher dosages but she is getting good response at this time. Restart Wellbutrin off label for ADD since can't get 2 ADDERALL daily. 150 mg daily then 300 mg daily. She can adjust the dose between 150 mg and 300 mg daily to get the optimal effect.   05/11/2020 appointment with the following noted: Has been inconsistent with Prozac and Wellbutrin. Not sure of the effect of Wellbutrin. Biggest deterrent in work is anxiety.   Some of the work is conceptual and difficult at times.  Can feel she's not up to a project at times.  Overall is OK but would like a steadier benefit from stimulant.  Exhausted from managing concentration and keeping up with things from the day.  Loses things.  Not good keeping up with schedule. Overall productive and emotionally OK. Can feel Adderall wear off. Mood is better in summer and worse in the winter.   F died in 10-19-23 and that is a loss. No SE Wellbutrin. Still attends AA meetings.  Real benefit from [employer] last year. Recognizes effect of anemia on ADD and mood.  Had iron infusions last winter. Plan:  Wellbutrin off label for ADD since can't get 2 ADDERALL daily. 150 mg daily then 300 mg daily.  01/24/2021 appointment with following noted: Doing a program called [employer] mindfulness app since Xmas.  CBT app helped the depression.  App helped her focus better.  Lost sign weight. Writing a lot. Before Xmas felt depressed and started negative thinking worse, self denigrating. Not drinking. More isolated.   Recognizes mo is narcissist.    Didn't tell anyone she was born until 3 mos later.  M aloof and uninterested in pt.  Lied about her birthday.  Mo lack of affection even with pt's kids. Going to [place] for a year and it helped her to quit drinking. Also misses kids being gone  with a hole also. Plan: No med changes  05/04/2021 appointment with the following noted: Therapist [name] thinks she's manic. Lost weight to 144#.   States she is still sleeping okay.  Admits she is hyper and recognizes that she is likely manic.  She feels great, euphoric with an increased sense of spiritual connectedness to God.  She has racing thoughts and talks fast and talks a lot and this is noted by her husband.  He thinks she is a bit hyper.  She has been able to maintain sobriety although she will have 1 glass of wine on special occasions but does not drink by herself.  She is not drinking to  excess.  She denies any dangerous impulsivity.  She is clearly not depressed and not particularly anxious.  She has no concerns about her medication and she has been compliant.  06/16/21 appt noted: So much better.  Going through a lot but the manic thing happened on top of it.  So much slower.  Didn't feel like losing anything with risperidone.  Likes the Adderalll at 10 mg. Some drowsiness in the AM and very drowsy from risperidone 2 mg HS. Prayer life is better. Handling stress better. Less depressed with risperidone. Still likes trazodone. Sleeps well. Plan: Reduce Prozac to 10 mg daily.  Consider stopping it because it can feel the mania however she is reluctant to do that because she fears relapse of depression. Reduce risperidone to 1.5 mg nightly due to side effects.  Discussed risk of worsening mania.  07/25/2021 appointment with the following noted: Misses the Adderall and hard to function without it. Depressed now. Heavy chest.  Anxious and guilty.  Body feels heavy.   Hates Wellbutrin.   Plan: Increase fluoxetine to 20 mg daily Add Abilify 1/2 of 15 mg tablet daily Wean wellbutrin by 1 tablet each week  bc she feels it is not helpful and DT polypharmacy Reduce risperidone to 1 daily for 1 week and stop it. Disc risk of mania. Increase Adderall to XR 20 mg AM  08/08/21 Much less depressed and starting to feel normal I feel a lot better. No SE.  Speech normal off risperidone. Sleeping OK on trazaodone and enough.   Noticed benefit from Adderall again. Plan: continue fluoxetine to 20 mg daily Continue Abilify 1/2 of 15 mg tablet daily for depression and mania continue Adderall to XR 20 mg AM  10/10/2021 phone call: Pt stated she feels like the Abilify should be decreased to 23m.She said she is depressed but rational and not suicidal.She has an appt Monday and can wait until then if you prefer. MD response: Reduce the Abilify to 7.5 mg every other day.  We will meet on 10/16/2021  and decide what to do from there.  10/16/2021 appointment with the following noted: More depressed.  Most depressed I've ever been.  Just numb.  Sense of grief.   Thinks the manic episode was unlike anything else she ever had.  Doesn't want to medicate against it.  Don't enjoy people.  Easily overwhelmed.  Had some death thoughts but not suicidal.  Has been functional.  Feels better today after reducing Abilify to every other day but she is only been doing that for 3 days. A/P: Episode of post manic depression was explained. continue fluoxetine to 20 mg daily Hold Abilify for 1 week then resume Abilify 1/2 of 15 mg tablet every other day for depression and mania continue Adderall to XR 20 mg AM  10/27/2021  appointment with the following noted: I'm doing so much better.  Handling the depression better. Better self talk and spiritual focus has helped.   Dep 6/10 manifesting as anxiety with low confidence.   F died 2  years ago and M 66 yo and is dependent . She is working hard to feel better but still feels depressed.  She almost feels like she has a little more anxiety since restarting Abilify every other day. Plan: continue fluoxetine to 20 mg daily DC Abilify .  Vrayalar 1.5 mg QOD to try to get rid of depression ASAP. continue Adderall to XR 20 mg AM  11/10/2021 appointment with the following noted: Busy with Xmas and it was fun with family but then a big let down.  Did well with it.  Functioned well with it.  Working hard on things with depression.  Not shutting down. Not sure but feels better today but yesterday was hard.  Difficulty dealing with mother.  She won't do anything to help herself.  Yesterday with her all day.  Won't do PT and has isolated herself.    Lack of confidence.   No SE with Vraylar.  11/24/21 urgent appointment appt noted: More and more depressed.   So anxious and doesn't want to be alone but can do so. No appetite. Hurts inside. Has had some fleeting suicidal  thoughts but would not act on them.  Tolerating meds. Has been consistent with Vraylar 1.5 mg every other day, fluoxetine 20 mg daily Plan: Increase Vraylar to 1.5 mg daily Change Prozac to Trintellix 10 mg daily. Discussed side effects of each continue Adderall to XR 20 mg AM  12/27/2021 appointment with the following noted: Not OK.  I feel less depressed but feels bat shit. Not sleeping well.  Extremely anxious. Off and on sleep. 3-4 hours of sleep.   Still having daily SI.  But also become obvious has so much to do.  Overwhelmed by tasks.   Needs anxiety meds to just function. Not more motivated.  Walked yesterday.   Feels afraid like in trouble but not irritable or angry. DC DT agitation Vraylar to 1.5 mg daily Change Prozac to Trintellix 10 mg daily. Hold Adderall to XR 20 mg AM Clonidine 0.1 1/2 tablet twice daily for 2 days and if needed for anxiety and sleep increase to 1 twice daily Ok temporary Ativan 1 mg 3 times daily as needed anxiety  01/05/22 appt noted: Off fluoxetine and  Trintellix.  Only on Ativan, trazodone and Adderall XR 20 plus added clonidine 0.1 mg BID Didn't think she needed to start Trintellix. Not taking Ativan.   Didn't like herself last week. Feels some better today. Wonders if the manic sx Not agitated.  Anxiety kind of calmed down.  A lot to be anxious about situationally.  $ stress. Concerns about downers with meds. Can't access normal personality. ? Lethargy and inability to talk as sE. Plan: Latuda 20-40 mg daily with food. Adderall to XR 20 mg AM Clonidine 0.1 1/2 tablet twice daily  reduce dose to be sure no SE Ok temporary Ativan 1 mg 3 times daily as needed anxiety  01/19/22 appt noted: Taking Latuda 20 mg daily.  Took 40 mg once and felt anxious and  SI Still depressed and not very reactive Anxiety mainly about the depression and fears of the future. She wants to revisit manic sx and thinks it was maybe bc taking delta 8 bc was taking a lot  of it so still  doesn't think she's classic bipolar. She wants to only take Prozac bc thinks Latuda is perpetuating depression. Says the delta 8 was very psychaedelic.  When not taking it was not manic.  Sleeping ok again.  Plan: Per her request DC Latuda 20-40 mg daily with food. She wants to continue Prozac alone AMA  Adderall to XR 20 mg AM Clonidine 0.1 1/2 tablet twice daily  reduce dose to be sure no SE Ok temporary Ativan 1 mg 3 times daily as needed anxiety  01/23/2022 phone call complaining of increased anxiety since stopping Latuda.  She will try increasing clonidine.  01/26/2022 phone call not feeling well and wanted to restart the Vraylar.  However notes indicate that had made her agitated therefore she was encouraged to pick up samples of Rexulti 1 mg and start that instead.  02/06/2022 phone call: Stating she felt the Rexulti was helping with depression but she was not sleeping well and obsessing over things.  She was encouraged to increase Rexulti to 2 mg daily and increase trazodone for sleep.  02/09/2022 appointment with the following noted: This was an urgent work in appointment No sleep last night with trazodone 100 mg HS Nothing really better depression or anxiety. Ruminating negative anxious thoughts. Did not tolerate Rexulti because it was causing insomnia.  Does not think it helped depression.  Lacks emotion that she should have.  Lacks her usual personality.  Some hopeless thoughts.  Some death thoughts.  Some suicidal thoughts without plan or intent Plan: DC Rexulti and Prozac & DC trazodone Adderall to XR 20 mg AM Clonidine 0.1 1/tablet twice daily  reduce dose to be sure no SE Ok temporary Ativan 1 mg 3 times daily as needed anxiety Start Seroquel XR 150 mg nightly  03/02/2022 appointment: Langley Gauss called back a few days after starting Seroquel stating it was making her more anxious and more depressed.  This seemed unlikely as this medicine rarely ever causes anxiety.   She stopped the medication waited 3 days and called back still had anxiety and depression but thought perhaps the anxiety was a little better.  She did not want to take the Seroquel. She knew about the option of Spravato and wanted to pursue that. Now questions whether to return to Seroquel while waiting to start Spravato bc feels just as bad without it and knows she didn't give it enough time to work.   MADRS 46  ECT-MADRS    Flowsheet Row Office Visit from 03/02/2022 in [facility]  MADRS Total Score 46      03/14/22 appt noted: Pt received Spravato 56 mg first dose today with some dissociative sx which were not severe.  She was anxious prior to the administration and felt better after receiving lorazepam 1 mg.  No NV, or HA. Wants to continue Spravato. Ongoing depression and desperate to feel better.  I'm not myself DT deprsssion which is most severe in recent history.  Anhedonia.  Low motivation.  Social avoidance. Continues to think all recent med trials are making her worse.  Sleep ok with Seroquel.  03/16/22 appt noted: Received Spravato 84 mg for the first time.  some dissociative sx which were not severe.  She was anxious prior to the administration and felt better after receiving lorazepam 1 mg.  No NV, or HA. Wants to continue Spravato.   Does not feel any better or different since the last appt.  Ongoing depression.  Ongoing depression and desperate to feel better.  I'm not  myself DT deprsssion which is most severe in recent history.  Anhedonia.  Low motivation.  Social avoidance. Continues to think all recent med trials are making her worse.  Sleep ok with Seroquel.  Does not want to continue Seroquel for TRD.  03/20/2022 appointment noted: Came for Spravato administration today.  However blood pressure was significantly elevated approximately 180/115.  She was given lorazepam 1 mg and clonidine 0.2 mg to try to get it down. She states she regretted stopping the  Seroquel XR 300 mg tablets.  She now realizes it was helpful.  She did not sleep much at all last night.  She did not take the Adderall this morning. 2 to 3 hours after arrival blood pressure was still elevated at  170/110, 62 pulse.  For Spravato administration was canceled for today.  She admits to being anxious and depressed.  She is not suicidal.  She is highly motivated to receive the Spravato.  We discussed getting it tomorrow.  03/22/2022 appointment noted: Patient's blood pressure was never stable enough yesterday in order to get her in for Spravato administration.  She was encouraged to see her primary care doctor.  It is better today.  03/26/2022 appointment with the following noted: Blood pressure was better.  Saw her primary care doctor who started on oral Bystolic 2.5 mg daily. Received Spravato 84 mg today as scheduled.  Tolerated it well without nausea or vomiting headache or chest pain or palpitations.  Her blood pressure was borderline but manageable. She remains depressed and anxious.  She is ambivalent about the medicine and desperate to get to feel better.  Continues to have anhedonia and low energy and low motivation and reduced ability to do things.  Less social.  Not suicidal.  03/28/22 appt noted: Received Spravato 84 mg today as scheduled.  Tolerated it well without nausea or vomiting headache or chest pain or palpitations.  Her blood pressure was borderline but manageable. Has not seen any improvement so far.  Tolerating Seroquel.  Inconsistent with Bystolic and BP has been borderline high. Still depressed and anxious and anhedonia.  Low motivation, energy, productivity. Taking quetiapine and tolerating XR 300 mg nightly.  04/04/22 appt noted: Received Spravato 84 mg today as scheduled.  Tolerated it well without nausea or vomiting headache or chest pain or palpitations.  Her blood pressure was borderline but manageable. Has not seen any improvement so far.  Tolerating  Seroquel.   She still tends to think that the medications are making her worse.  She has said this about each of the recent psychiatric medicines including Seroquel.  However her husband thinks she is improved.  She also admits there is some improvement in productivity.  She still feels highly anxious.  She still does not enjoy things as normal.  She still feels desperate to improve as soon as possible. Has been taking Seroquel XR since 03/20/2022  04/10/22 appt noted: Received Spravato 84 mg today as scheduled.  Tolerated it well without nausea or vomiting headache or chest pain or palpitations.  Her blood pressure was borderline but manageable. Has not seen any improvement so far.  Tolerating Seroquel.  Doesn't like Seroquel bc she thinks it flattens here. 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  Tolerated it well without nausea or vomiting headache or chest pain or palpitations.  Her blood pressure was borderline but manageable. Has  not seen any improvement so far.  Tolerating Seroquel.  Doesn't like Seroquel bc she thinks it flattens here.Received Spravato 84 mg today as scheduled.  Tolerated it well without nausea or vomiting headache or chest pain or palpitations.  Her blood pressure was borderline but manageable. Has not seen any improvement so far.  Tolerating Seroquel.  Doesn't like Seroquel bc she thinks it flattens here.  We discussed her ambivalence about it. She is starting Auvelity and has tolerated it the last 2 days without side effect.  She still does not feel like herself and feels flat and not enjoying things with suppressed expressed emotion  Past Psychiatric Medication Trials: fluoxetine, duloxetine, Viibryd, lamotrigine, Pristiq, sertraline, citalopram,  Trintellix anxious and SI Wellbutrin  Adderall,  Adderall XR, Vyvanse, Ritalin, Strattera low dose NR Lorazepam Trazodone  Depakote,  lamotrigine cog complaints Lithium remotely Abilify 7.5  Vraylar 1.5 mg daily agitation and insomnia Rexulti insomnia Latuda 40 one dose, CO anxious and SI Seroquel XR 300  At visit November 12, 2019. We discussed Patient developed an increasingly severe alcohol dependence problem since her last visit in January.  She went to [employer] [facility] and has had no alcohol since then except 1 day.  She never abused stimulants but they took her off the stimulants at [employer].  Her ADD was markedly worse.  The Wellbutrin did not help the ADD.   D history lamotrigine rash at 66 yo  Review of Systems:  Review of Systems  Constitutional:  Positive for fatigue.  Cardiovascular:  Negative for chest pain and palpitations.  Musculoskeletal:  Positive for arthralgias, back pain and joint swelling.       SP hip surgery October 2020  Neurological:  Negative for dizziness.  Psychiatric/Behavioral:  Positive for decreased concentration, dysphoric mood and sleep disturbance. Negative for behavioral problems, confusion, hallucinations, self-injury and suicidal ideas. The patient is nervous/anxious. The patient is not hyperactive.    Medications: I have reviewed the patient's current medications.  Current Outpatient Medications  Medication Sig Dispense Refill   amphetamine-dextroamphetamine (ADDERALL XR) 20 MG 24 hr capsule Take 1 capsule (20 mg total) by mouth every morning. 30 capsule 0   cloNIDine (CATAPRES) 0.1 MG tablet TAKE 1 TABLET BY MOUTH TWICE A DAY 60 tablet 0   cloNIDine (CATAPRES) 0.2 MG tablet TAKE 1 TABLET BY MOUTH 2 TIMES DAILY. 60 tablet 0   Dextromethorphan-buPROPion ER (AUVELITY) 45-105 MG TBCR Take 1 tablet by mouth every morning.     Esketamine HCl, 84 MG Dose, (SPRAVATO, 84 MG DOSE,) 28 MG/DEVICE SOPK USE 3 SPRAYS IN EACH NOSTRIL TWICE A WEEK 3 each 5   iron polysaccharides (NIFEREX) 150 MG  capsule TAKE 1 CAPSULE BY MOUTH EVERY DAY 30 capsule 1   LORazepam (ATIVAN) 1 MG tablet Take 1 tablet (1 mg total) by mouth every 8 (eight) hours. 90 tablet 0   nebivolol (BYSTOLIC) 2.5 MG tablet Take 2.5 mg by mouth daily.     QUEtiapine (SEROQUEL XR) 300 MG 24 hr tablet Take 1 tablet (300 mg total) by mouth at bedtime. 30 tablet 0   No current facility-administered medications for this visit.    Medication Side Effects: None  Allergies:  Allergies  Allergen Reactions   Metronidazole Shortness Of Breath and Other (See Comments)    Heart pounding   Ferrlecit [Na Ferric Gluc Cplx In Sucrose] Other (See Comments)    Infusion reaction 05/12/2019    Past Medical History:  Diagnosis Date   ADHD  Anemia    Anxiety    Arthritis    Depression    Heart murmur    i went to see a cardiologit slast eyar  and i had zero plaque,    PONV (postoperative nausea and vomiting)    Recovering alcoholic in remission (HCC)     Family History  Problem Relation Age of Onset   Atrial fibrillation Mother    CAD Father     Social History   Socioeconomic History   Marital status: Married    Spouse name: Not on file   Number of children: Not on file   Years of education: Not on file   Highest education level: Not on file  Occupational History   Not on file  Tobacco Use   Smoking status: Former    Types: Cigarettes    Quit date: 08/16/2003    Years since quitting: 18.6   Smokeless tobacco: Never   Tobacco comments:     08-28-2019 "i smoked 2 cigarettes in the last month since my father  passed"  Vaping Use   Vaping Use: Never used  Substance and Sexual Activity   Alcohol use: Yes    Alcohol/week: 10.0 standard drinks    Types: 10 Glasses of wine per week   Drug use: No   Sexual activity: Not on file  Other Topics Concern   Not on file  Social History Narrative   Not on file   Social Determinants of Health   Financial Resource Strain: Not on file  Food Insecurity: Not on file   Transportation Needs: Not on file  Physical Activity: Not on file  Stress: Not on file  Social Connections: Not on file  Intimate Partner Violence: Not on file    Past Medical History, Surgical history, Social history, and Family history were reviewed and updated as appropriate.   Please see review of systems for further details on the patient's review from today.   Objective:   Physical Exam:  There were no vitals taken for this visit.  Physical Exam Constitutional:      General: She is not in acute distress. Neurological:     Mental Status: She is alert and oriented to person, place, and time.     Coordination: Coordination normal.     Gait: Gait normal.  Psychiatric:        Attention and Perception: Perception normal. She is attentive.        Mood and Affect: Mood is anxious and depressed. Affect is blunt. Affect is not labile, angry, tearful or inappropriate.        Speech: Speech is not rapid and pressured, slurred or tangential.        Behavior: Behavior is not slowed or aggressive.        Thought Content: Thought content is not paranoid or delusional. Thought content does not include homicidal or suicidal ideation. Thought content does not include suicidal plan.        Cognition and Memory: Cognition normal. She does not exhibit impaired recent memory.        Judgment: Judgment normal.     Comments: Insight intact. No auditory or visual hallucinations. No delusions.  Depression  ongoing. Affect largely unchanged and is still depressed and anxious No Sui intent plan     Lab Review:     Component Value Date/Time   NA 137 01/12/2021 1430   NA 140 11/18/2018 1544   K 3.8 01/12/2021 1430   CL 108 01/12/2021 1430  CO2 22 01/12/2021 1430   GLUCOSE 94 01/12/2021 1430   BUN 14 01/12/2021 1430   BUN 20 11/18/2018 1544   CREATININE 0.82 01/12/2021 1430   CALCIUM 8.9 01/12/2021 1430   PROT 6.6 01/12/2021 1430   ALBUMIN 3.9 01/12/2021 1430   AST 12 (L) 01/12/2021  1430   ALT 11 01/12/2021 1430   ALKPHOS 46 01/12/2021 1430   BILITOT 0.5 01/12/2021 1430   GFRNONAA >60 01/12/2021 1430   GFRAA >60 09/02/2019 0249   GFRAA >60 01/27/2019 0811       Component Value Date/Time   WBC 4.5 01/12/2021 1430   RBC 4.32 01/12/2021 1430   HGB 12.8 01/12/2021 1430   HGB 12.9 07/17/2019 0953   HCT 38.5 01/12/2021 1430   HCT 21.9 (L) 12/25/2018 1221   PLT 272 01/12/2021 1430   PLT 286 07/17/2019 0953   MCV 89.1 01/12/2021 1430   MCH 29.6 01/12/2021 1430   MCHC 33.2 01/12/2021 1430   RDW 12.4 01/12/2021 1430   LYMPHSABS 1.4 01/12/2021 1430   MONOABS 0.4 01/12/2021 1430   EOSABS 0.0 01/12/2021 1430   BASOSABS 0.0 01/12/2021 1430    No results found for: POCLITH, LITHIUM   No results found for: PHENYTOIN, PHENOBARB, VALPROATE, CBMZ   .res Assessment: Plan:    Recurrent major depression resistant to treatment ([place])  Generalized anxiety disorder  Attention deficit hyperactivity disorder (ADHD), predominantly inattentive type  Insomnia due to mental condition  Accelerated hypertension   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  We discussed some of her recent abnormal behaviors leading to this depressive episode getting worse which she says were associated with heavy use of delta 8 and not a manic episode.  She realizes now that that was not good for her.  She stopped all use of other drugs including those available over-the-counter such as delta 8 or any other THC related products.  She is no longer having any of those types of behaviors and instead is depressed. She remains persistently depressed with lack of interest and lack of feeling for things that normally she would have feelings about.  She has low motivation and energy.  She is sad and down.  She is less productive than usual.  Her concentration is poor.  She has high degree of anxiety as well. She has a very negative self-image and low self-esteem.  These are all  uncharacteristic for her  Failed multiple antidepressants.  Many of them were not actual failures but intolerances and it is unclear whether some of that was more connected with anxiety than true side effects.  1 example is the [place].  In general she does not want to try anything but an antidepressant but has failed all major categories of antidepressants except TCAs and MAO inhibitors which have not been tried.  Could consider a TCA but given her manic response to delta 8 there is some concern about the risk of a TCA triggering mania.  It could be combined with lithium but she generally is unfavorable about taking lithium.  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  She wants to continue Spravato.  We discussed discussed the side effects in detail as well as the protocol required to receive Spravato.  S The protocol requires use of a antidepressant along with Spravato at present we are using Seroquel XR 300 mg nightly  for treatment resistant depression as well as sleep and anxiety.  When she stopped it she could not sleep.  We may consider alternative depression medications such as Auvelity once anxiety is better managed.  Has been taking Seroquel XR since 03/20/2022.  For now continue 300 mg nightly for treatment resistant depression and anxiety  Patient was administered Spravato 84 mg intranasally today.  The patient experienced the typical dissociation which gradually resolved over the 2-hour period of observation.  There were no complications.  Specifically the patient did not have nausea or vomiting or headache.  Blood pressures remained within normal ranges at the 40-minute and 2-hour follow-up intervals.  By the time the 2-hour observation period was met the patient was alert and oriented and able to exit without assistance.  Patient feels the Spravato  administration is helpful for the treatment resistant depression and would like to continue the treatment.  See nursing note for further details. BP ok today  Unfortunately she remains depressed.  Discussed alternatives.  Adderall to XR 20 mg AM Clonidine increased to 0.2 mg twice daily off label for anxiety She agrees to continue Seroquel XR 300 mg nightly for treatment resistant depression as well as sleep and anxiety.  But she is ambivalent about the medicine Ok temporary Ativan 1 mg 3 times daily as needed anxiety Auvelity 1 tablet started each AM on 04/10/22 She needs to continue to have a conversation with primary care doctor about her blood pressure medicines.  She was prescribed Bystolic but is taking it intermittently.  Take Bystolic consistently per PCP.  Blood pressure is still borderline and may need to be further addressed  Discussed potential benefits, risks, and side effects of stimulants with patient to include increased heart rate, palpitations, insomnia, increased anxiety, increased irritability, or decreased appetite.  Instructed patient to contact office if experiencing any significant tolerability issues. She wants to return to usual dose of Adderall for ADD bc of mor poor cognitive function with reduction.  Also discussed that depression will impair cognitive function.  Discussed safety plan at length with patient.  Advised patient to contact office with any worsening signs and symptoms.  Instructed patient to go to the [facility] emergency room for evaluation if experiencing any acute safety concerns, to include suicidal intent.  Has Maintained sobriety  FU with Spravato twice weekly  Lynder Parents, MD, DFAPA     Please see After Visit Summary for patient specific instructions.  No future appointments.        No orders of the defined types were placed in this encounter.      -------------------------------

## 2022-04-15 NOTE — Progress Notes (Signed)
Nurse visit:   Patient arrived for her 8 th Spravato treatment. Pt is being treated for Treatment Resistant Depression, pt will be receiving 84 mg which will continue to be her maintenance dose. Patient arrived and taken to treatment room.  Answered any questions and concerns the patient had since her last treatment and what to expect today. Confirmed she had a ride home which is her husband would be coming back to pick up pt when done.  Pt's Spravato is ordered through [employer] and delivered to office, all Spravato medication is stored at doctors office per REMS/FDA guidelines. The medication is required to be locked behind two doors per FDA/REMS Protocol. Medication is also disposed of properly per regulations.      Began taking patient's vital signs at 9:50 AM 147/86, pulse 58. Instructed patient to blow her nose then recline back to a 45 degree angle. Gave patient first dose 28 mg nasal spray, patient is still trying to get the hang of using the nasal sprays, each nasal spray administered in each nostril as directed and waited 5 minutes between the second and third dose. After all 3 doses given pt did not complain of any nausea/vomiting, given a cup of water due to the taste after the administration of Spravato. Patient doing well, and is experiencing dissociation, she really likes the relaxation it makes her feel. Checked 40 minute vitals at 10:30 AM, 117/68, pulse 56.  No complaints. Explained she would be monitored for a total time of 120 minutes. Discharge vitals were taken at 11:50  AM 147/88, P 50. Dr. Clovis Pu did come visit with patient and discuss how treatment went at the end. I walked pt to elevator, where her husband met her for the ride home. Recommend she go home and sleep or just relax on the couch. No driving, no intense activities. Verbalized understanding. Pt. will be receiving 2 treatments per week a few more weeks until her PA needs renewal on 04/30/22 then decide from there  how her treatments will be then. Nurse was with pt a total of 60 minutes for clinical assessment. Pt is scheduled Thursday, June 1st. Pt instructed to call office with any problems or questions.      LOT [id number]

## 2022-04-15 NOTE — Progress Notes (Signed)
Nurse visit:   Patient arrived for her 7th Spravato treatment. Pt is being treated for Treatment Resistant Depression, pt will be receiving 84 mg which will continue to be her maintenance dose. Patient arrived and taken to treatment room.  Answered any questions and concerns the patient had since her last treatment and what to expect today. Confirmed she had a ride home which is her husband would be coming back to pick up pt when done.  Pt's Spravato is ordered through [employer] and delivered to office, all Spravato medication is stored at doctors office per REMS/FDA guidelines. The medication is required to be locked behind two doors per FDA/REMS Protocol. Medication is also disposed of properly per regulations.      Began taking patient's vital signs at 9:45 AM 149/92, pulse 56. Instructed patient to blow her nose then recline back to a 45 degree angle. Gave patient first dose 28 mg nasal spray, patient is still trying to get the hang of using the nasal sprays, each nasal spray administered in each nostril as directed and waited 5 minutes between the second and third dose. After all 3 doses given pt did not complain of any nausea/vomiting, given a cup of water due to the taste after the administration of Spravato. Patient doing well, and is experiencing dissociation, she really likes the relaxation it makes her feel. Checked 40 minute vitals at 10:25 AM, 130/84, pulse 46.  No complaints. Explained she would be monitored for a total time of 120 minutes. Discharge vitals were taken at 11:45 PM 141/86, P 48. Dr. Clovis Pu did come visit with patient and discuss how treatment went at the end. I walked pt to elevator, where her husband met her for the ride home. Recommend she go home and sleep or just relax on the couch. No driving, no intense activities. Verbalized understanding. Pt. will be receiving 2 treatments per week for 4 weeks as recommended. Then reassessed to go down to 1 treatment weekly if pt  is stable at that time. Nurse was with pt a total of 60 minutes for clinical assessment. Pt is scheduled Tuesday, May 30th due to holiday. Pt instructed to call office with any problems or questions.      LOT [id number] EXP [id number]

## 2022-04-16 ENCOUNTER — Other Ambulatory Visit: Payer: Self-pay

## 2022-04-16 ENCOUNTER — Telehealth: Payer: Self-pay | Admitting: Psychiatry

## 2022-04-16 DIAGNOSIS — F900 Attention-deficit hyperactivity disorder, predominantly inattentive type: Secondary | ICD-10-CM

## 2022-04-16 MED ORDER — AMPHETAMINE-DEXTROAMPHET ER 20 MG PO CP24: 20.0000 mg | ORAL_CAPSULE | ORAL | 0 refills | Status: DC

## 2022-04-16 NOTE — Telephone Encounter (Signed)
Pended.

## 2022-04-16 NOTE — Telephone Encounter (Signed)
Patient lvm at 11:34 requesting refill for Adderall 20mg . Ph: [phone] Pharmacy [facility] [address] [place]

## 2022-04-17 ENCOUNTER — Ambulatory Visit: Payer: 59

## 2022-04-17 ENCOUNTER — Ambulatory Visit (INDEPENDENT_AMBULATORY_CARE_PROVIDER_SITE_OTHER): Payer: 59 | Admitting: Psychiatry

## 2022-04-17 ENCOUNTER — Encounter: Payer: Self-pay | Admitting: Psychiatry

## 2022-04-17 ENCOUNTER — Other Ambulatory Visit: Payer: Self-pay | Admitting: Psychiatry

## 2022-04-17 ENCOUNTER — Other Ambulatory Visit: Payer: Self-pay

## 2022-04-17 VITALS — BP 107/60 | HR 50

## 2022-04-17 DIAGNOSIS — F5105 Insomnia due to other mental disorder: Secondary | ICD-10-CM | POA: Diagnosis not present

## 2022-04-17 DIAGNOSIS — F900 Attention-deficit hyperactivity disorder, predominantly inattentive type: Secondary | ICD-10-CM | POA: Diagnosis not present

## 2022-04-17 DIAGNOSIS — F339 Major depressive disorder, recurrent, unspecified: Secondary | ICD-10-CM

## 2022-04-17 DIAGNOSIS — I10 Essential (primary) hypertension: Secondary | ICD-10-CM

## 2022-04-17 DIAGNOSIS — F411 Generalized anxiety disorder: Secondary | ICD-10-CM | POA: Diagnosis not present

## 2022-04-17 MED ORDER — AMPHETAMINE-DEXTROAMPHET ER 20 MG PO CP24: 20.0000 mg | ORAL_CAPSULE | ORAL | 0 refills | Status: DC

## 2022-04-17 MED ORDER — AUVELITY 45-105 MG PO TBCR: 1.0000 | EXTENDED_RELEASE_TABLET | Freq: Two times a day (BID) | ORAL | 1 refills | Status: DC

## 2022-04-17 NOTE — Telephone Encounter (Signed)
Ptcalled to check the status of this adderall script. It looks like it failed to send

## 2022-04-17 NOTE — Telephone Encounter (Signed)
Please approve

## 2022-04-17 NOTE — Telephone Encounter (Signed)
Patient lvm regarding prior messages. Returned call to [facility] and was told that pharmacy didn't have the prescription. Please resend to [facility] [address] Jacky Kindle Ph: [phone]

## 2022-04-17 NOTE — Progress Notes (Signed)
Laura Chang [id number] 1955-11-28 66 y.o.  Subjective:   Patient ID:  Laura Chang is a 66 y.o. (DOB 1956/01/31) female.  Chief Complaint:  Chief Complaint  Patient presents with   Follow-up   Depression   Anxiety   ADD     HPI Laura Chang presents to the office today for follow-up of depression and anxiety and ADD.  seen November 12, 2019.  Melted down in 2020.  Went to [employer] in July.  No withdrawal.  1 drink since then.  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 She was on no stimulant and no SSRI but was taking Strattera and Wellbutrin.  The following changes were made. Stop Strattera. OK restart stimulant bc severe ADD Restart Adderall 1 daily for a few days and if tolerated then restart 1 twice daily. If not tolerated reduce the dosage if needed. May need to stop Wellbutrin if not tolerating the stimulant.  Yes.  DC Wellbutrin Restart Prozac 20 mg daily.  February 2021 appointment with the following noted: Completed grant proposal.  Couldn't doit without Adderall.  Sold a bunch of work.   Adderall XR lasts about 3 pm.  Strength seems about right.  BP been OK.  Not jittery.   Stopped Wellbutrin but had no SE. Mood drastically better with grant proposal and back on fluoxetine.  Less depressed and lethargic.  No anxiety.  Cut back on coffee. Started back with devotions and stronger faith. Plan: Continue Prozac 20 mg daily. May have to increase the dose at some point in the future given that she usually was taking higher dosages but she is getting good response at this time. Restart Wellbutrin off label for ADD since can't get 2 ADDERALL daily. 150 mg daily then 300 mg daily. She can adjust the dose between 150 mg and 300 mg daily to get the optimal effect.   05/11/2020 appointment with the following noted: Has been inconsistent with Prozac and Wellbutrin. Not sure of the effect of Wellbutrin. Biggest deterrent in work is anxiety.   Some of the work is conceptual and difficult at times.  Can feel she's not up to a project at times.  Overall is OK but would like a steadier benefit from stimulant.  Exhausted from managing concentration and keeping up with things from the day.  Loses things.  Not good keeping up with schedule. Overall productive and emotionally OK. Can feel Adderall wear off. Mood is better in summer and worse in the winter.   F died in September 25, 2023 and that is a loss. No SE Wellbutrin. Still attends AA meetings.  Real benefit from [place] last year. Recognizes effect of anemia on ADD and mood.  Had iron infusions last winter. Plan:  Wellbutrin off label for ADD since can't get 2 ADDERALL daily. 150 mg daily then 300 mg daily.  01/24/2021 appointment with following noted: Doing a program called [employer] mindfulness app since Xmas.  CBT app helped the depression.  App helped her focus better.  Lost sign weight. Writing a lot. Before Xmas felt depressed and started negative thinking worse, self denigrating. Not drinking. More isolated.   Recognizes mo is narcissist.    Didn't tell anyone she was born until 3 mos later.  M aloof and uninterested in pt.  Lied about her birthday.  Mo lack of affection even with pt's kids. Going to [place] for a year and it helped her to quit drinking. Also misses kids being gone  with a hole also. Plan: No med changes  05/04/2021 appointment with the following noted: Therapist [name] thinks she's manic. Lost weight to 144#.   States she is still sleeping okay.  Admits she is hyper and recognizes that she is likely manic.  She feels great, euphoric with an increased sense of spiritual connectedness to God.  She has racing thoughts and talks fast and talks a lot and this is noted by her husband.  He thinks she is a bit hyper.  She has been able to maintain sobriety although she will have 1 glass of wine on special occasions but does not drink by herself.  She is not drinking to  excess.  She denies any dangerous impulsivity.  She is clearly not depressed and not particularly anxious.  She has no concerns about her medication and she has been compliant.  06/16/21 appt noted: So much better.  Going through a lot but the manic thing happened on top of it.  So much slower.  Didn't feel like losing anything with risperidone.  Likes the Adderalll at 10 mg. Some drowsiness in the AM and very drowsy from risperidone 2 mg HS. Prayer life is better. Handling stress better. Less depressed with risperidone. Still likes trazodone. Sleeps well. Plan: Reduce Prozac to 10 mg daily.  Consider stopping it because it can feel the mania however she is reluctant to do that because she fears relapse of depression. Reduce risperidone to 1.5 mg nightly due to side effects.  Discussed risk of worsening mania.  07/25/2021 appointment with the following noted: Misses the Adderall and hard to function without it. Depressed now. Heavy chest.  Anxious and guilty.  Body feels heavy.   Hates Wellbutrin.   Plan: Increase fluoxetine to 20 mg daily Add Abilify 1/2 of 15 mg tablet daily Wean wellbutrin by 1 tablet each week  bc she feels it is not helpful and DT polypharmacy Reduce risperidone to 1 daily for 1 week and stop it. Disc risk of mania. Increase Adderall to XR 20 mg AM  08/08/21 Much less depressed and starting to feel normal I feel a lot better. No SE.  Speech normal off risperidone. Sleeping OK on trazaodone and enough.   Noticed benefit from Adderall again. Plan: continue fluoxetine to 20 mg daily Continue Abilify 1/2 of 15 mg tablet daily for depression and mania continue Adderall to XR 20 mg AM  10/10/2021 phone call: Pt stated she feels like the Abilify should be decreased to 23m.She said she is depressed but rational and not suicidal.She has an appt Monday and can wait until then if you prefer. MD response: Reduce the Abilify to 7.5 mg every other day.  We will meet on 10/16/2021  and decide what to do from there.  10/16/2021 appointment with the following noted: More depressed.  Most depressed I've ever been.  Just numb.  Sense of grief.   Thinks the manic episode was unlike anything else she ever had.  Doesn't want to medicate against it.  Don't enjoy people.  Easily overwhelmed.  Had some death thoughts but not suicidal.  Has been functional.  Feels better today after reducing Abilify to every other day but she is only been doing that for 3 days. A/P: Episode of post manic depression was explained. continue fluoxetine to 20 mg daily Hold Abilify for 1 week then resume Abilify 1/2 of 15 mg tablet every other day for depression and mania continue Adderall to XR 20 mg AM  10/27/2021  appointment with the following noted: I'm doing so much better.  Handling the depression better. Better self talk and spiritual focus has helped.   Dep 6/10 manifesting as anxiety with low confidence.   F died 2  years ago and M 66 yo and is dependent . She is working hard to feel better but still feels depressed.  She almost feels like she has a little more anxiety since restarting Abilify every other day. Plan: continue fluoxetine to 20 mg daily DC Abilify .  Vrayalar 1.5 mg QOD to try to get rid of depression ASAP. continue Adderall to XR 20 mg AM  11/10/2021 appointment with the following noted: Busy with Xmas and it was fun with family but then a big let down.  Did well with it.  Functioned well with it.  Working hard on things with depression.  Not shutting down. Not sure but feels better today but yesterday was hard.  Difficulty dealing with mother.  She won't do anything to help herself.  Yesterday with her all day.  Won't do PT and has isolated herself.    Lack of confidence.   No SE with Vraylar.  11/24/21 urgent appointment appt noted: More and more depressed.   So anxious and doesn't want to be alone but can do so. No appetite. Hurts inside. Has had some fleeting suicidal  thoughts but would not act on them.  Tolerating meds. Has been consistent with Vraylar 1.5 mg every other day, fluoxetine 20 mg daily Plan: Increase Vraylar to 1.5 mg daily Change Prozac to Trintellix 10 mg daily. Discussed side effects of each continue Adderall to XR 20 mg AM  12/27/2021 appointment with the following noted: Not OK.  I feel less depressed but feels bat shit. Not sleeping well.  Extremely anxious. Off and on sleep. 3-4 hours of sleep.   Still having daily SI.  But also become obvious has so much to do.  Overwhelmed by tasks.   Needs anxiety meds to just function. Not more motivated.  Walked yesterday.   Feels afraid like in trouble but not irritable or angry. DC DT agitation Vraylar to 1.5 mg daily Change Prozac to Trintellix 10 mg daily. Hold Adderall to XR 20 mg AM Clonidine 0.1 1/2 tablet twice daily for 2 days and if needed for anxiety and sleep increase to 1 twice daily Ok temporary Ativan 1 mg 3 times daily as needed anxiety  01/05/22 appt noted: Off fluoxetine and  Trintellix.  Only on Ativan, trazodone and Adderall XR 20 plus added clonidine 0.1 mg BID Didn't think she needed to start Trintellix. Not taking Ativan.   Didn't like herself last week. Feels some better today. Wonders if the manic sx Not agitated.  Anxiety kind of calmed down.  A lot to be anxious about situationally.  $ stress. Concerns about downers with meds. Can't access normal personality. ? Lethargy and inability to talk as sE. Plan: Latuda 20-40 mg daily with food. Adderall to XR 20 mg AM Clonidine 0.1 1/2 tablet twice daily  reduce dose to be sure no SE Ok temporary Ativan 1 mg 3 times daily as needed anxiety  01/19/22 appt noted: Taking Latuda 20 mg daily.  Took 40 mg once and felt anxious and  SI Still depressed and not very reactive Anxiety mainly about the depression and fears of the future. She wants to revisit manic sx and thinks it was maybe bc taking delta 8 bc was taking a lot  of it so still  doesn't think she's classic bipolar. She wants to only take Prozac bc thinks Latuda is perpetuating depression. Says the delta 8 was very psychaedelic.  When not taking it was not manic.  Sleeping ok again.  Plan: Per her request DC Latuda 20-40 mg daily with food. She wants to continue Prozac alone AMA  Adderall to XR 20 mg AM Clonidine 0.1 1/2 tablet twice daily  reduce dose to be sure no SE Ok temporary Ativan 1 mg 3 times daily as needed anxiety  01/23/2022 phone call complaining of increased anxiety since stopping Latuda.  She will try increasing clonidine.  01/26/2022 phone call not feeling well and wanted to restart the Vraylar.  However notes indicate that had made her agitated therefore she was encouraged to pick up samples of Rexulti 1 mg and start that instead.  02/06/2022 phone call: Stating she felt the Rexulti was helping with depression but she was not sleeping well and obsessing over things.  She was encouraged to increase Rexulti to 2 mg daily and increase trazodone for sleep.  02/09/2022 appointment with the following noted: This was an urgent work in appointment No sleep last night with trazodone 100 mg HS Nothing really better depression or anxiety. Ruminating negative anxious thoughts. Did not tolerate Rexulti because it was causing insomnia.  Does not think it helped depression.  Lacks emotion that she should have.  Lacks her usual personality.  Some hopeless thoughts.  Some death thoughts.  Some suicidal thoughts without plan or intent Plan: DC Rexulti and Prozac & DC trazodone Adderall to XR 20 mg AM Clonidine 0.1 1/tablet twice daily  reduce dose to be sure no SE Ok temporary Ativan 1 mg 3 times daily as needed anxiety Start Seroquel XR 150 mg nightly  03/02/2022 appointment: Langley Gauss called back a few days after starting Seroquel stating it was making her more anxious and more depressed.  This seemed unlikely as this medicine rarely ever causes anxiety.   She stopped the medication waited 3 days and called back still had anxiety and depression but thought perhaps the anxiety was a little better.  She did not want to take the Seroquel. She knew about the option of Spravato and wanted to pursue that. Now questions whether to return to Seroquel while waiting to start Spravato bc feels just as bad without it and knows she didn't give it enough time to work.   MADRS 46  ECT-MADRS    Flowsheet Row Office Visit from 03/02/2022 in [facility]  MADRS Total Score 46      03/14/22 appt noted: Pt received Spravato 56 mg first dose today with some dissociative sx which were not severe.  She was anxious prior to the administration and felt better after receiving lorazepam 1 mg.  No NV, or HA. Wants to continue Spravato. Ongoing depression and desperate to feel better.  I'm not myself DT deprsssion which is most severe in recent history.  Anhedonia.  Low motivation.  Social avoidance. Continues to think all recent med trials are making her worse.  Sleep ok with Seroquel.  03/16/22 appt noted: Received Spravato 84 mg for the first time.  some dissociative sx which were not severe.  She was anxious prior to the administration and felt better after receiving lorazepam 1 mg.  No NV, or HA. Wants to continue Spravato.   Does not feel any better or different since the last appt.  Ongoing depression.  Ongoing depression and desperate to feel better.  I'm not  myself DT deprsssion which is most severe in recent history.  Anhedonia.  Low motivation.  Social avoidance. Continues to think all recent med trials are making her worse.  Sleep ok with Seroquel.  Does not want to continue Seroquel for TRD.  03/20/2022 appointment noted: Came for Spravato administration today.  However blood pressure was significantly elevated approximately 180/115.  She was given lorazepam 1 mg and clonidine 0.2 mg to try to get it down. She states she regretted stopping the  Seroquel XR 300 mg tablets.  She now realizes it was helpful.  She did not sleep much at all last night.  She did not take the Adderall this morning. 2 to 3 hours after arrival blood pressure was still elevated at  170/110, 62 pulse.  For Spravato administration was canceled for today.  She admits to being anxious and depressed.  She is not suicidal.  She is highly motivated to receive the Spravato.  We discussed getting it tomorrow.  03/22/2022 appointment noted: Patient's blood pressure was never stable enough yesterday in order to get her in for Spravato administration.  She was encouraged to see her primary care doctor.  It is better today.  03/26/2022 appointment with the following noted: Blood pressure was better.  Saw her primary care doctor who started on oral Bystolic 2.5 mg daily. Received Spravato 84 mg today as scheduled.  Tolerated it well without nausea or vomiting headache or chest pain or palpitations.  Her blood pressure was borderline but manageable. She remains depressed and anxious.  She is ambivalent about the medicine and desperate to get to feel better.  Continues to have anhedonia and low energy and low motivation and reduced ability to do things.  Less social.  Not suicidal.  03/28/22 appt noted: Received Spravato 84 mg today as scheduled.  Tolerated it well without nausea or vomiting headache or chest pain or palpitations.  Her blood pressure was borderline but manageable. Has not seen any improvement so far.  Tolerating Seroquel.  Inconsistent with Bystolic and BP has been borderline high. Still depressed and anxious and anhedonia.  Low motivation, energy, productivity. Taking quetiapine and tolerating XR 300 mg nightly.  04/04/22 appt noted: Received Spravato 84 mg today as scheduled.  Tolerated it well without nausea or vomiting headache or chest pain or palpitations.  Her blood pressure was borderline but manageable. Has not seen any improvement so far.  Tolerating  Seroquel.   She still tends to think that the medications are making her worse.  She has said this about each of the recent psychiatric medicines including Seroquel.  However her husband thinks she is improved.  She also admits there is some improvement in productivity.  She still feels highly anxious.  She still does not enjoy things as normal.  She still feels desperate to improve as soon as possible. Has been taking Seroquel XR since 03/20/2022  04/10/22 appt noted: Received Spravato 84 mg today as scheduled.  Tolerated it well without nausea or vomiting headache or chest pain or palpitations.  Her blood pressure was borderline but manageable. Has not seen any improvement so far.  Tolerating Seroquel.  Doesn't like Seroquel bc she thinks it flattens here. 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  Tolerated it well without nausea or vomiting headache or chest pain or palpitations.  Her blood pressure was borderline but manageable. Has  not seen any improvement so far.  Tolerating Seroquel.  Doesn't like Seroquel bc she thinks it flattens her. Received Spravato 84 mg today as scheduled.  Tolerated it well without nausea or vomiting headache or chest pain or palpitations.  Her blood pressure was borderline but manageable. Has not seen any improvement so far.  Tolerating Seroquel.  Doesn't like Seroquel bc she thinks it flattens here.  We discussed her ambivalence about it. She is starting Auvelity and has tolerated it the last 2 days without side effect.  She still does not feel like herself and feels flat and not enjoying things with suppressed expressed emotion  04/17/2022 appointment with the following noted: Received Spravato 84 mg today as scheduled.  Tolerated it well without nausea or vomiting headache or chest pain or  palpitations.  Her blood pressure was borderline but manageable. Has not seen any improvement so far.  Tolerating Seroquel.  Doesn't like Seroquel bc she thinks it flattens her. She has been tolerating the Auvelity 1 in the morning without side effects for about a week.  She has not noticed significant improvement so far.  She still feels depressed and flat and not herself.  Other people notice that she is flat emotionally.  She is not suicidal.  She does feel discouraged that she is not getting better yet.  Past Psychiatric Medication Trials: fluoxetine, duloxetine, Viibryd, lamotrigine, Pristiq, sertraline, citalopram,  Trintellix anxious and SI Wellbutrin  Adderall, Adderall XR, Vyvanse, Ritalin, Strattera low dose NR Lorazepam Trazodone  Depakote,  lamotrigine cog complaints Lithium remotely Abilify 7.5  Vraylar 1.5 mg daily agitation and insomnia Rexulti insomnia Latuda 40 one dose, CO anxious and SI Seroquel XR 300  At visit November 12, 2019. We discussed Patient developed an increasingly severe alcohol dependence problem since her last visit in January.  She went to [facility] and has had no alcohol since then except 1 day.  She never abused stimulants but they took her off the stimulants at [facility].  Her ADD was markedly worse.  The Wellbutrin did not help the ADD.   D history lamotrigine rash at 66 yo  Review of Systems:  Review of Systems  Constitutional:  Positive for fatigue.  Cardiovascular:  Negative for chest pain and palpitations.  Musculoskeletal:  Positive for arthralgias, back pain and joint swelling.       SP hip surgery October 2020  Neurological:  Negative for dizziness and light-headedness.  Psychiatric/Behavioral:  Positive for decreased concentration, dysphoric mood and sleep disturbance. Negative for behavioral problems, confusion, hallucinations, self-injury and suicidal ideas. The patient is nervous/anxious. The patient is not hyperactive.     Medications: I have reviewed the patient's current medications.  Current Outpatient Medications  Medication Sig Dispense Refill   amphetamine-dextroamphetamine (ADDERALL XR) 20 MG 24 hr capsule Take 1 capsule (20 mg total) by mouth every morning. 30 capsule 0   cloNIDine (CATAPRES) 0.1 MG tablet TAKE 1 TABLET BY MOUTH TWICE A DAY 60 tablet 0   cloNIDine (CATAPRES) 0.2 MG tablet TAKE 1 TABLET BY MOUTH 2 TIMES DAILY. 60 tablet 0   Dextromethorphan-buPROPion ER (AUVELITY) 45-105 MG TBCR Take 1 tablet by mouth every morning.     Esketamine HCl, 84 MG Dose, (SPRAVATO, 84 MG DOSE,) 28 MG/DEVICE SOPK USE 3 SPRAYS IN EACH NOSTRIL TWICE A WEEK 3 each 5   iron polysaccharides (NIFEREX) 150 MG capsule TAKE 1 CAPSULE BY MOUTH EVERY DAY 30 capsule 1   LORazepam (ATIVAN) 1 MG tablet Take 1 tablet (  1 mg total) by mouth every 8 (eight) hours. 90 tablet 0   nebivolol (BYSTOLIC) 2.5 MG tablet Take 2.5 mg by mouth daily.     QUEtiapine (SEROQUEL XR) 300 MG 24 hr tablet Take 1 tablet (300 mg total) by mouth at bedtime. 30 tablet 0   No current facility-administered medications for this visit.    Medication Side Effects: None  Allergies:  Allergies  Allergen Reactions   Metronidazole Shortness Of Breath and Other (See Comments)    Heart pounding   Ferrlecit [Na Ferric Gluc Cplx In Sucrose] Other (See Comments)    Infusion reaction 05/12/2019    Past Medical History:  Diagnosis Date   ADHD    Anemia    Anxiety    Arthritis    Depression    Heart murmur    i went to see a cardiologit slast eyar  and i had zero plaque,    PONV (postoperative nausea and vomiting)    Recovering alcoholic in remission ([place])     Family History  Problem Relation Age of Onset   Atrial fibrillation Mother    CAD Father     Social History   Socioeconomic History   Marital status: Married    Spouse name: Not on file   Number of children: Not on file   Years of education: Not on file   Highest education level:  Not on file  Occupational History   Not on file  Tobacco Use   Smoking status: Former    Types: Cigarettes    Quit date: 08/16/2003    Years since quitting: 18.6   Smokeless tobacco: Never   Tobacco comments:     08-28-2019 "i smoked 2 cigarettes in the last month since my father  passed"  Vaping Use   Vaping Use: Never used  Substance and Sexual Activity   Alcohol use: Yes    Alcohol/week: 10.0 standard drinks    Types: 10 Glasses of wine per week   Drug use: No   Sexual activity: Not on file  Other Topics Concern   Not on file  Social History Narrative   Not on file   Social Determinants of Health   Financial Resource Strain: Not on file  Food Insecurity: Not on file  Transportation Needs: Not on file  Physical Activity: Not on file  Stress: Not on file  Social Connections: Not on file  Intimate Partner Violence: Not on file    Past Medical History, Surgical history, Social history, and Family history were reviewed and updated as appropriate.   Please see review of systems for further details on the patient's review from today.   Objective:   Physical Exam:  There were no vitals taken for this visit.  Physical Exam Constitutional:      General: She is not in acute distress. Neurological:     Mental Status: She is alert and oriented to person, place, and time.     Coordination: Coordination normal.     Gait: Gait normal.  Psychiatric:        Attention and Perception: Perception normal. She is attentive.        Mood and Affect: Mood is anxious and depressed. Affect is blunt. Affect is not labile, angry, tearful or inappropriate.        Speech: Speech is not rapid and pressured, slurred or tangential.        Behavior: Behavior is not slowed or aggressive.        Thought Content:  Thought content is not paranoid or delusional. Thought content does not include homicidal or suicidal ideation. Thought content does not include suicidal plan.        Cognition and  Memory: Cognition normal. She does not exhibit impaired recent memory.        Judgment: Judgment normal.     Comments: Insight intact. No auditory or visual hallucinations. No delusions.  Depression  ongoing. Affect largely unchanged and is still depressed and anxious did have brief smile today. No Sui intent plan     Lab Review:     Component Value Date/Time   NA 137 01/12/2021 1430   NA 140 11/18/2018 1544   K 3.8 01/12/2021 1430   CL 108 01/12/2021 1430   CO2 22 01/12/2021 1430   GLUCOSE 94 01/12/2021 1430   BUN 14 01/12/2021 1430   BUN 20 11/18/2018 1544   CREATININE 0.82 01/12/2021 1430   CALCIUM 8.9 01/12/2021 1430   PROT 6.6 01/12/2021 1430   ALBUMIN 3.9 01/12/2021 1430   AST 12 (L) 01/12/2021 1430   ALT 11 01/12/2021 1430   ALKPHOS 46 01/12/2021 1430   BILITOT 0.5 01/12/2021 1430   GFRNONAA >60 01/12/2021 1430   GFRAA >60 09/02/2019 0249   GFRAA >60 01/27/2019 0811       Component Value Date/Time   WBC 4.5 01/12/2021 1430   RBC 4.32 01/12/2021 1430   HGB 12.8 01/12/2021 1430   HGB 12.9 07/17/2019 0953   HCT 38.5 01/12/2021 1430   HCT 21.9 (L) 12/25/2018 1221   PLT 272 01/12/2021 1430   PLT 286 07/17/2019 0953   MCV 89.1 01/12/2021 1430   MCH 29.6 01/12/2021 1430   MCHC 33.2 01/12/2021 1430   RDW 12.4 01/12/2021 1430   LYMPHSABS 1.4 01/12/2021 1430   MONOABS 0.4 01/12/2021 1430   EOSABS 0.0 01/12/2021 1430   BASOSABS 0.0 01/12/2021 1430    No results found for: POCLITH, LITHIUM   No results found for: PHENYTOIN, PHENOBARB, VALPROATE, CBMZ   .res Assessment: Plan:    Recurrent major depression resistant to treatment ([place])  Generalized anxiety disorder  Attention deficit hyperactivity disorder (ADHD), predominantly inattentive type  Insomnia due to mental condition  Accelerated hypertension   She has treatment resistant major depression at this time.  We discussed some of her recent abnormal behaviors leading to this depressive episode  getting worse which she says were associated with heavy use of delta 8 and not a manic episode.  She realizes now that that was not good for her.  She stopped all use of other drugs including those available over-the-counter such as delta 8 or any other THC related products.  She is no longer having any of those types of behaviors and instead is depressed. She remains persistently depressed with lack of interest and lack of feeling for things that normally she would have feelings about.  She has low motivation and energy.  She is sad and down.  She is less productive than usual.  Her concentration is poor.  She has high degree of anxiety as well. She has a very negative self-image and low self-esteem.  These are all uncharacteristic for her  Failed multiple antidepressants.  Many of them were not actual failures but intolerances and it is unclear whether some of that was more connected with anxiety than true side effects.  1 example is the [place].  In general she does not want to try anything but an antidepressant but has failed all major categories of  antidepressants except TCAs and MAO inhibitors which have not been tried.  Could consider a TCA but given her manic response to delta 8 there is some concern about the risk of a TCA triggering mania.  It could be combined with lithium but she generally is unfavorable about taking lithium.  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  She wants to continue Spravato.  We discussed discussed the side effects in detail as well as the protocol required to receive Spravato.  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  When she stopped it she could not sleep.    She has started Auvelity 1  in the morning for 1 week and has tolerated it well without anxiety. Plan increase Auvelity to 1 twice daily. If this works well we will consider tapering the Seroquel  Has been taking Seroquel XR since 03/20/2022.  For now continue 300 mg nightly for treatment resistant depression and anxiety  Started Spravato 84 mg twice weekly on 03/16/2022.  Patient was administered Spravato 84 mg intranasally today.  The patient experienced the typical dissociation which gradually resolved over the 2-hour period of observation.  There were no complications.  Specifically the patient did not have nausea or vomiting or headache.  Blood pressures remained within normal ranges at the 40-minute and 2-hour follow-up intervals.  By the time the 2-hour observation period was met the patient was alert and oriented and able to exit without assistance.  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  See nursing note for further details. BP ok today  Unfortunately she remains depressed.  Discussed alternatives.  Adderall to XR 20 mg AM Clonidine increased to 0.2 mg twice daily off label for anxiety She agrees to continue Seroquel XR 300 mg nightly for treatment resistant depression as well as sleep and anxiety.  But she is ambivalent about the medicine Ok temporary Ativan 1 mg 3 times daily as needed anxiety  She needs to continue to have a conversation with primary care doctor about her blood pressure medicines.  She was prescribed Bystolic but is taking it intermittently.  Take Bystolic consistently per PCP.  B blood pressure was better today  Discussed potential benefits, risks, and side effects of stimulants with patient to include increased heart rate, palpitations, insomnia, increased anxiety, increased irritability, or decreased appetite.  Instructed patient to contact office if experiencing any significant tolerability  issues. She wants to return to usual dose of Adderall for ADD bc of mor poor cognitive function with reduction.  Also discussed that depression will impair cognitive function.  Discussed safety plan at length with patient.  Advised patient to contact office with any worsening signs and symptoms.  Instructed patient to go to the [facility] emergency room for evaluation if experiencing any acute safety concerns, to include suicidal intent.  Has Maintained sobriety  FU with Spravato twice weekly  Lynder Parents, MD, DFAPA     Please see After Visit Summary for patient specific instructions.  Future Appointments  Date Time Provider [place]  04/19/2022  2:00 PM Cottle, Billey Co., MD CP-CP None  04/19/2022  2:00 PM CP-NURSE [facility] None  No orders of the defined types were placed in this encounter.      -------------------------------

## 2022-04-18 NOTE — Progress Notes (Signed)
Nurse visit:   Patient arrived for her 9th Spravato treatment. Pt is being treated for Treatment Resistant Depression, pt will be receiving 84 mg which will continue to be her maintenance dose. Patient arrived and taken to treatment room.  Answered any questions and concerns the patient had since her last treatment and what to expect today. Confirmed she had a ride home which is her husband would be coming back to pick up pt when done.  Pt's Spravato is ordered through [employer] and delivered to office, all Spravato medication is stored at doctors office per REMS/FDA guidelines. The medication is required to be locked behind two doors per FDA/REMS Protocol. Medication is also disposed of properly per regulations.      Began taking patient's vital signs at 2:20 PM 133/80, pulse 65. Instructed patient to blow her nose then recline back to a 45 degree angle. Gave patient first dose 28 mg nasal spray, patient is still trying to get the hang of using the nasal sprays, each nasal spray administered in each nostril as directed and waited 5 minutes between the second and third dose. After all 3 doses given pt did not complain of any nausea/vomiting, given a cup of water due to the taste after the administration of Spravato. Patient doing well, and is experiencing dissociation, she really likes the relaxation it makes her feel. Checked 40 minute vitals at 3:10 PM, 114/72, pulse 54.  No complaints. Explained she would be monitored for a total time of 120 minutes. Discharge vitals were taken at 4:18  PM 97/77, P 50. Dr. Clovis Pu did come visit with patient and discuss how treatment went at the end. I walked pt to elevator, where her husband met her for the ride home. Recommend she go home and sleep or just relax on the couch. No driving, no intense activities. Verbalized understanding. Pt. will be receiving 2 treatments per week a few more weeks until her PA needs renewal on 04/30/22 then decide from there how  her treatments will be then. Nurse was with pt a total of 60 minutes for clinical assessment. Pt is scheduled Tuesday, June 6th. Pt instructed to call office with any problems or questions.      LOT [id number] EXP [id number]

## 2022-04-19 ENCOUNTER — Ambulatory Visit (INDEPENDENT_AMBULATORY_CARE_PROVIDER_SITE_OTHER): Payer: 59 | Admitting: Psychiatry

## 2022-04-19 ENCOUNTER — Encounter: Payer: Self-pay | Admitting: Psychiatry

## 2022-04-19 ENCOUNTER — Ambulatory Visit: Payer: 59

## 2022-04-19 VITALS — BP 119/80 | HR 59

## 2022-04-19 DIAGNOSIS — I10 Essential (primary) hypertension: Secondary | ICD-10-CM

## 2022-04-19 DIAGNOSIS — F411 Generalized anxiety disorder: Secondary | ICD-10-CM

## 2022-04-19 DIAGNOSIS — F5105 Insomnia due to other mental disorder: Secondary | ICD-10-CM

## 2022-04-19 DIAGNOSIS — F339 Major depressive disorder, recurrent, unspecified: Secondary | ICD-10-CM

## 2022-04-19 DIAGNOSIS — F900 Attention-deficit hyperactivity disorder, predominantly inattentive type: Secondary | ICD-10-CM

## 2022-04-19 NOTE — Progress Notes (Signed)
Laura Chang [id number] 1956/08/01 66 y.o.  Subjective:   Patient ID:  Laura Chang is a 66 y.o. (DOB Feb 15, 1956) female.  Chief Complaint:  Chief Complaint  Patient presents with   Follow-up   Depression   Anxiety   Fatigue   ADD     HPI Carsen D Dola Chang presents to the office today for follow-up of depression and anxiety and ADD.  seen November 12, 2019.  Melted down in 2020.  Went to [employer] in July.  No withdrawal.  1 drink since then.  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 She was on no stimulant and no SSRI but was taking Strattera and Wellbutrin.  The following changes were made. Stop Strattera. OK restart stimulant bc severe ADD Restart Adderall 1 daily for a few days and if tolerated then restart 1 twice daily. If not tolerated reduce the dosage if needed. May need to stop Wellbutrin if not tolerating the stimulant.  Yes.  DC Wellbutrin Restart Prozac 20 mg daily.  February 2021 appointment with the following noted: Completed grant proposal.  Couldn't doit without Adderall.  Sold a bunch of work.   Adderall XR lasts about 3 pm.  Strength seems about right.  BP been OK.  Not jittery.   Stopped Wellbutrin but had no SE. Mood drastically better with grant proposal and back on fluoxetine.  Less depressed and lethargic.  No anxiety.  Cut back on coffee. Started back with devotions and stronger faith. Plan: Continue Prozac 20 mg daily. May have to increase the dose at some point in the future given that she usually was taking higher dosages but she is getting good response at this time. Restart Wellbutrin off label for ADD since can't get 2 ADDERALL daily. 150 mg daily then 300 mg daily. She can adjust the dose between 150 mg and 300 mg daily to get the optimal effect.   05/11/2020 appointment with the following noted: Has been inconsistent with Prozac and Wellbutrin. Not sure of the effect of Wellbutrin. Biggest deterrent in work is  anxiety.  Some of the work is conceptual and difficult at times.  Can feel she's not up to a project at times.  Overall is OK but would like a steadier benefit from stimulant.  Exhausted from managing concentration and keeping up with things from the day.  Loses things.  Not good keeping up with schedule. Overall productive and emotionally OK. Can feel Adderall wear off. Mood is better in summer and worse in the winter.   F died in 2023-10-04 and that is a loss. No SE Wellbutrin. Still attends AA meetings.  Real benefit from [place] last year. Recognizes effect of anemia on ADD and mood.  Had iron infusions last winter. Plan:  Wellbutrin off label for ADD since can't get 2 ADDERALL daily. 150 mg daily then 300 mg daily.  01/24/2021 appointment with following noted: Doing a program called [employer] mindfulness app since Xmas.  CBT app helped the depression.  App helped her focus better.  Lost sign weight. Writing a lot. Before Xmas felt depressed and started negative thinking worse, self denigrating. Not drinking. More isolated.   Recognizes mo is narcissist.    Didn't tell anyone she was born until 3 mos later.  M aloof and uninterested in pt.  Lied about her birthday.  Mo lack of affection even with pt's kids. Going to [employer] for a year and it helped her to quit drinking. Also misses  kids being gone with a hole also. Plan: No med changes  05/04/2021 appointment with the following noted: Therapist Paula Pile thinks she's manic. Lost weight to 144#.   States she is still sleeping okay.  Admits she is hyper and recognizes that she is likely manic.  She feels great, euphoric with an increased sense of spiritual connectedness to God.  She has racing thoughts and talks fast and talks a lot and this is noted by her husband.  He thinks she is a bit hyper.  She has been able to maintain sobriety although she will have 1 glass of wine on special occasions but does not drink by herself.  She is not  drinking to excess.  She denies any dangerous impulsivity.  She is clearly not depressed and not particularly anxious.  She has no concerns about her medication and she has been compliant.  06/16/21 appt noted: So much better.  Going through a lot but the manic thing happened on top of it.  So much slower.  Didn't feel like losing anything with risperidone.  Likes the Adderalll at 10 mg. Some drowsiness in the AM and very drowsy from risperidone 2 mg HS. Prayer life is better. Handling stress better. Less depressed with risperidone. Still likes trazodone. Sleeps well. Plan: Reduce Prozac to 10 mg daily.  Consider stopping it because it can feel the mania however she is reluctant to do that because she fears relapse of depression. Reduce risperidone to 1.5 mg nightly due to side effects.  Discussed risk of worsening mania.  07/25/2021 appointment with the following noted: Misses the Adderall and hard to function without it. Depressed now. Heavy chest.  Anxious and guilty.  Body feels heavy.   Hates Wellbutrin.   Plan: Increase fluoxetine to 20 mg daily Add Abilify 1/2 of 15 mg tablet daily Wean wellbutrin by 1 tablet each week  bc she feels it is not helpful and DT polypharmacy Reduce risperidone to 1 daily for 1 week and stop it. Disc risk of mania. Increase Adderall to XR 20 mg AM  08/08/21 Much less depressed and starting to feel normal I feel a lot better. No SE.  Speech normal off risperidone. Sleeping OK on trazaodone and enough.   Noticed benefit from Adderall again. Plan: continue fluoxetine to 20 mg daily Continue Abilify 1/2 of 15 mg tablet daily for depression and mania continue Adderall to XR 20 mg AM  10/10/2021 phone call: Pt stated she feels like the Abilify should be decreased to 5mg.She said she is depressed but rational and not suicidal.She has an appt Monday and can wait until then if you prefer. MD response: Reduce the Abilify to 7.5 mg every other day.  We will meet on  10/16/2021 and decide what to do from there.  10/16/2021 appointment with the following noted: More depressed.  Most depressed I've ever been.  Just numb.  Sense of grief.   Thinks the manic episode was unlike anything else she ever had.  Doesn't want to medicate against it.  Don't enjoy people.  Easily overwhelmed.  Had some death thoughts but not suicidal.  Has been functional.  Feels better today after reducing Abilify to every other day but she is only been doing that for 3 days. A/P: Episode of post manic depression was explained. continue fluoxetine to 20 mg daily Hold Abilify for 1 week then resume Abilify 1/2 of 15 mg tablet every other day for depression and mania continue Adderall to XR 20 mg   AM  10/27/2021 appointment with the following noted: I'm doing so much better.  Handling the depression better. Better self talk and spiritual focus has helped.   Dep 6/10 manifesting as anxiety with low confidence.   F died 2  years ago and M 66 yo and is dependent . She is working hard to feel better but still feels depressed.  She almost feels like she has a little more anxiety since restarting Abilify every other day. Plan: continue fluoxetine to 20 mg daily DC Abilify .  Vrayalar 1.5 mg QOD to try to get rid of depression ASAP. continue Adderall to XR 20 mg AM  11/10/2021 appointment with the following noted: Busy with Xmas and it was fun with family but then a big let down.  Did well with it.  Functioned well with it.  Working hard on things with depression.  Not shutting down. Not sure but feels better today but yesterday was hard.  Difficulty dealing with mother.  She won't do anything to help herself.  Yesterday with her all day.  Won't do PT and has isolated herself.    Lack of confidence.   No SE with Vraylar.  11/24/21 urgent appointment appt noted: More and more depressed.   So anxious and doesn't want to be alone but can do so. No appetite. Hurts inside. Has had some fleeting  suicidal thoughts but would not act on them.  Tolerating meds. Has been consistent with Vraylar 1.5 mg every other day, fluoxetine 20 mg daily Plan: Increase Vraylar to 1.5 mg daily Change Prozac to Trintellix 10 mg daily. Discussed side effects of each continue Adderall to XR 20 mg AM  12/27/2021 appointment with the following noted: Not OK.  I feel less depressed but feels bat shit. Not sleeping well.  Extremely anxious. Off and on sleep. 3-4 hours of sleep.   Still having daily SI.  But also become obvious has so much to do.  Overwhelmed by tasks.   Needs anxiety meds to just function. Not more motivated.  Walked yesterday.   Feels afraid like in trouble but not irritable or angry. DC DT agitation Vraylar to 1.5 mg daily Change Prozac to Trintellix 10 mg daily. Hold Adderall to XR 20 mg AM Clonidine 0.1 1/2 tablet twice daily for 2 days and if needed for anxiety and sleep increase to 1 twice daily Ok temporary Ativan 1 mg 3 times daily as needed anxiety  01/05/22 appt noted: Off fluoxetine and  Trintellix.  Only on Ativan, trazodone and Adderall XR 20 plus added clonidine 0.1 mg BID Didn't think she needed to start Trintellix. Not taking Ativan.   Didn't like herself last week. Feels some better today. Wonders if the manic sx Not agitated.  Anxiety kind of calmed down.  A lot to be anxious about situationally.  $ stress. Concerns about downers with meds. Can't access normal personality. ? Lethargy and inability to talk as sE. Plan: Latuda 20-40 mg daily with food. Adderall to XR 20 mg AM Clonidine 0.1 1/2 tablet twice daily  reduce dose to be sure no SE Ok temporary Ativan 1 mg 3 times daily as needed anxiety  01/19/22 appt noted: Taking Latuda 20 mg daily.  Took 40 mg once and felt anxious and  SI Still depressed and not very reactive Anxiety mainly about the depression and fears of the future. She wants to revisit manic sx and thinks it was maybe bc taking delta 8 bc was  taking a lot of   it so still doesn't think she's classic bipolar. She wants to only take Prozac bc thinks Latuda is perpetuating depression. Says the delta 8 was very psychaedelic.  When not taking it was not manic.  Sleeping ok again.  Plan: Per her request DC Latuda 20-40 mg daily with food. She wants to continue Prozac alone AMA  Adderall to XR 20 mg AM Clonidine 0.1 1/2 tablet twice daily  reduce dose to be sure no SE Ok temporary Ativan 1 mg 3 times daily as needed anxiety  01/23/2022 phone call complaining of increased anxiety since stopping Latuda.  She will try increasing clonidine.  01/26/2022 phone call not feeling well and wanted to restart the Vraylar.  However notes indicate that had made her agitated therefore she was encouraged to pick up samples of Rexulti 1 mg and start that instead.  02/06/2022 phone call: Stating she felt the Rexulti was helping with depression but she was not sleeping well and obsessing over things.  She was encouraged to increase Rexulti to 2 mg daily and increase trazodone for sleep.  02/09/2022 appointment with the following noted: This was an urgent work in appointment No sleep last night with trazodone 100 mg HS Nothing really better depression or anxiety. Ruminating negative anxious thoughts. Did not tolerate Rexulti because it was causing insomnia.  Does not think it helped depression.  Lacks emotion that she should have.  Lacks her usual personality.  Some hopeless thoughts.  Some death thoughts.  Some suicidal thoughts without plan or intent Plan: DC Rexulti and Prozac & DC trazodone Adderall to XR 20 mg AM Clonidine 0.1 1/tablet twice daily  reduce dose to be sure no SE Ok temporary Ativan 1 mg 3 times daily as needed anxiety Start Seroquel XR 150 mg nightly  03/02/2022 appointment: Langley Gauss called back a few days after starting Seroquel stating it was making her more anxious and more depressed.  This seemed unlikely as this medicine rarely ever  causes anxiety.  She stopped the medication waited 3 days and called back still had anxiety and depression but thought perhaps the anxiety was a little better.  She did not want to take the Seroquel. She knew about the option of Spravato and wanted to pursue that. Now questions whether to return to Seroquel while waiting to start Spravato bc feels just as bad without it and knows she didn't give it enough time to work.   MADRS 46  ECT-MADRS    Flowsheet Row Office Visit from 03/02/2022 in [facility]  MADRS Total Score 46      03/14/22 appt noted: Pt received Spravato 56 mg first dose today with some dissociative sx which were not severe.  She was anxious prior to the administration and felt better after receiving lorazepam 1 mg.  No NV, or HA. Wants to continue Spravato. Ongoing depression and desperate to feel better.  I'm not myself DT deprsssion which is most severe in recent history.  Anhedonia.  Low motivation.  Social avoidance. Continues to think all recent med trials are making her worse.  Sleep ok with Seroquel.  03/16/22 appt noted: Received Spravato 84 mg for the first time.  some dissociative sx which were not severe.  She was anxious prior to the administration and felt better after receiving lorazepam 1 mg.  No NV, or HA. Wants to continue Spravato.   Does not feel any better or different since the last appt.  Ongoing depression.  Ongoing depression and desperate to feel better.  I'm not myself DT deprsssion which is most severe in recent history.  Anhedonia.  Low motivation.  Social avoidance. Continues to think all recent med trials are making her worse.  Sleep ok with Seroquel.  Does not want to continue Seroquel for TRD.  03/20/2022 appointment noted: Came for Spravato administration today.  However blood pressure was significantly elevated approximately 180/115.  She was given lorazepam 1 mg and clonidine 0.2 mg to try to get it down. She states she regretted  stopping the Seroquel XR 300 mg tablets.  She now realizes it was helpful.  She did not sleep much at all last night.  She did not take the Adderall this morning. 2 to 3 hours after arrival blood pressure was still elevated at  170/110, 62 pulse.  For Spravato administration was canceled for today.  She admits to being anxious and depressed.  She is not suicidal.  She is highly motivated to receive the Spravato.  We discussed getting it tomorrow.  03/22/2022 appointment noted: Patient's blood pressure was never stable enough yesterday in order to get her in for Spravato administration.  She was encouraged to see her primary care doctor.  It is better today.  03/26/2022 appointment with the following noted: Blood pressure was better.  Saw her primary care doctor who started on oral Bystolic 2.5 mg daily. Received Spravato 84 mg today as scheduled.  Tolerated it well without nausea or vomiting headache or chest pain or palpitations.  Her blood pressure was borderline but manageable. She remains depressed and anxious.  She is ambivalent about the medicine and desperate to get to feel better.  Continues to have anhedonia and low energy and low motivation and reduced ability to do things.  Less social.  Not suicidal.  03/28/22 appt noted: Received Spravato 84 mg today as scheduled.  Tolerated it well without nausea or vomiting headache or chest pain or palpitations.  Her blood pressure was borderline but manageable. Has not seen any improvement so far.  Tolerating Seroquel.  Inconsistent with Bystolic and BP has been borderline high. Still depressed and anxious and anhedonia.  Low motivation, energy, productivity. Taking quetiapine and tolerating XR 300 mg nightly.  04/04/22 appt noted: Received Spravato 84 mg today as scheduled.  Tolerated it well without nausea or vomiting headache or chest pain or palpitations.  Her blood pressure was borderline but manageable. Has not seen any improvement so far.   Tolerating Seroquel.   She still tends to think that the medications are making her worse.  She has said this about each of the recent psychiatric medicines including Seroquel.  However her husband thinks she is improved.  She also admits there is some improvement in productivity.  She still feels highly anxious.  She still does not enjoy things as normal.  She still feels desperate to improve as soon as possible. Has been taking Seroquel XR since 03/20/2022  04/10/22 appt noted: Received Spravato 84 mg today as scheduled.  Tolerated it well without nausea or vomiting headache or chest pain or palpitations.  Her blood pressure was borderline but manageable. Has not seen any improvement so far.  Tolerating Seroquel.  Doesn't like Seroquel bc she thinks it flattens here. 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  Tolerated it well without nausea or vomiting headache or chest pain or palpitations.  Her blood pressure was borderline but  manageable. Has not seen any improvement so far.  Tolerating Seroquel.  Doesn't like Seroquel bc she thinks it flattens her. Received Spravato 84 mg today as scheduled.  Tolerated it well without nausea or vomiting headache or chest pain or palpitations.  Her blood pressure was borderline but manageable. Has not seen any improvement so far.  Tolerating Seroquel.  Doesn't like Seroquel bc she thinks it flattens here.  We discussed her ambivalence about it. She is starting Auvelity and has tolerated it the last 2 days without side effect.  She still does not feel like herself and feels flat and not enjoying things with suppressed expressed emotion  04/17/2022 appointment with the following noted: Received Spravato 84 mg today as scheduled.  Tolerated it well without nausea or vomiting headache or chest pain  or palpitations.  Her blood pressure was borderline but manageable. Has not seen any improvement so far.  Tolerating Seroquel.  Doesn't like Seroquel bc she thinks it flattens her. She has been tolerating the Auvelity 1 in the morning without side effects for about a week.  She has not noticed significant improvement so far.  She still feels depressed and flat and not herself.  Other people notice that she is flat emotionally.  She is not suicidal.  She does feel discouraged that she is not getting better yet.  04/19/2022 appointment noted: Has increased Auvelity to 1 twice daily for 2 days, continues quetiapine XR 300 mg nightly, clonidine 0.3 mg twice daily, lorazepam 1 mg twice daily for anxiety and Adderall XR 20 mg in the morning. No obious SE but she still thinks quetiapine XR is making her feel down.  But not sedated Received Spravato 84 mg today as scheduled.  Tolerated it well without nausea or vomiting headache or chest pain or palpitations.  Her blood pressure was borderline but manageable. She still feels quite anxious and feels it necessary to take both the clonidine and lorazepam twice a day to manage her anxiety.  She has been consistently down and flat and not herself until yesterday afternoon she noted an improvement in mood and feeling much more like herself with her normal personality reemerging.  She was quite depressed in the morning with very dark negative thoughts.  She did not have those dark negative thoughts this morning.  She had a lot of questions about medication and when she was expecting to be improved and why she has not shown improvement up to now.  Past Psychiatric Medication Trials: fluoxetine, duloxetine, Viibryd, lamotrigine, Pristiq, sertraline, citalopram,  Trintellix anxious and SI Wellbutrin  Adderall, Adderall XR, Vyvanse, Ritalin, Strattera low dose NR Lorazepam Trazodone  Depakote,  lamotrigine cog complaints Lithium remotely Abilify 7.5  Vraylar 1.5  mg daily agitation and insomnia Rexulti insomnia Latuda 40 one dose, CO anxious and SI Seroquel XR 300  At visit November 12, 2019. We discussed Patient developed an increasingly severe alcohol dependence problem since her last visit in January.  She went to [facility] and has had no alcohol since then except 1 day.  She never abused stimulants but they took her off the stimulants at [facility].  Her ADD was markedly worse.  The Wellbutrin did not help the ADD.   D history lamotrigine rash at 65 yo  Review of Systems:  Review of Systems  Constitutional:  Positive for fatigue.  Cardiovascular:  Negative for chest pain and palpitations.  Musculoskeletal:  Positive for arthralgias, back pain and joint swelling.  SP hip surgery October 2020  Neurological:  Negative for dizziness.  Psychiatric/Behavioral:  Positive for decreased concentration, dysphoric mood and sleep disturbance. Negative for behavioral problems, confusion, hallucinations, self-injury and suicidal ideas. The patient is nervous/anxious. The patient is not hyperactive.     Medications: I have reviewed the patient's current medications.  Current Outpatient Medications  Medication Sig Dispense Refill   [START ON 05/15/2022] amphetamine-dextroamphetamine (ADDERALL XR) 20 MG 24 hr capsule Take 1 capsule (20 mg total) by mouth every morning. 30 capsule 0   amphetamine-dextroamphetamine (ADDERALL XR) 20 MG 24 hr capsule Take 1 capsule (20 mg total) by mouth every morning. 30 capsule 0   cloNIDine (CATAPRES) 0.1 MG tablet TAKE 1 TABLET BY MOUTH TWICE A DAY 60 tablet 0   cloNIDine (CATAPRES) 0.2 MG tablet TAKE 1 TABLET BY MOUTH 2 TIMES DAILY. 60 tablet 0   Dextromethorphan-buPROPion ER (AUVELITY) 45-105 MG TBCR Take 1 tablet by mouth 2 (two) times daily. 60 tablet 1   Esketamine HCl, 84 MG Dose, (SPRAVATO, 84 MG DOSE,) 28 MG/DEVICE SOPK USE 3 SPRAYS IN EACH NOSTRIL TWICE A WEEK 3 each 5   iron polysaccharides (NIFEREX)  150 MG capsule TAKE 1 CAPSULE BY MOUTH EVERY DAY 30 capsule 1   LORazepam (ATIVAN) 1 MG tablet Take 1 tablet (1 mg total) by mouth every 8 (eight) hours. 90 tablet 0   nebivolol (BYSTOLIC) 2.5 MG tablet Take 2.5 mg by mouth daily.     QUEtiapine (SEROQUEL XR) 300 MG 24 hr tablet Take 1 tablet (300 mg total) by mouth at bedtime. 30 tablet 0   No current facility-administered medications for this visit.    Medication Side Effects: None  Allergies:  Allergies  Allergen Reactions   Metronidazole Shortness Of Breath and Other (See Comments)    Heart pounding   Ferrlecit [Na Ferric Gluc Cplx In Sucrose] Other (See Comments)    Infusion reaction 05/12/2019    Past Medical History:  Diagnosis Date   ADHD    Anemia    Anxiety    Arthritis    Depression    Heart murmur    i went to see a cardiologit slast eyar  and i had zero plaque,    PONV (postoperative nausea and vomiting)    Recovering alcoholic in remission (HCC)     Family History  Problem Relation Age of Onset   Atrial fibrillation Mother    CAD Father     Social History   Socioeconomic History   Marital status: Married    Spouse name: Not on file   Number of children: Not on file   Years of education: Not on file   Highest education level: Not on file  Occupational History   Not on file  Tobacco Use   Smoking status: Former    Types: Cigarettes    Quit date: 08/16/2003    Years since quitting: 18.6   Smokeless tobacco: Never   Tobacco comments:     08-28-2019 "i smoked 2 cigarettes in the last month since my father  passed"  Vaping Use   Vaping Use: Never used  Substance and Sexual Activity   Alcohol use: Yes    Alcohol/week: 10.0 standard drinks of alcohol    Types: 10 Glasses of wine per week   Drug use: No   Sexual activity: Not on file  Other Topics Concern   Not on file  Social History Narrative   Not on file   Social Determinants of Health  Financial Resource Strain: Not on file  Food  Insecurity: Not on file  Transportation Needs: Not on file  Physical Activity: Not on file  Stress: Not on file  Social Connections: Not on file  Intimate Partner Violence: Not on file    Past Medical History, Surgical history, Social history, and Family history were reviewed and updated as appropriate.   Please see review of systems for further details on the patient's review from today.   Objective:   Physical Exam:  There were no vitals taken for this visit.  Physical Exam Constitutional:      General: She is not in acute distress. Neurological:     Mental Status: She is alert and oriented to person, place, and time.     Coordination: Coordination normal.     Gait: Gait normal.  Psychiatric:        Attention and Perception: Perception normal. She is attentive.        Mood and Affect: Mood is anxious and depressed. Affect is blunt. Affect is not labile, angry, tearful or inappropriate.        Speech: Speech is not rapid and pressured, slurred or tangential.        Behavior: Behavior is not slowed or aggressive.        Thought Content: Thought content is not paranoid or delusional. Thought content does not include homicidal or suicidal ideation. Thought content does not include suicidal plan.        Cognition and Memory: Cognition normal. She does not exhibit impaired recent memory.        Judgment: Judgment normal.     Comments: Insight intact. No auditory or visual hallucinations. No delusions.  Depression  ongoing. Affect largely unchanged and is still depressed and anxious did have more smile today. No Sui intent plan      Lab Review:     Component Value Date/Time   NA 137 01/12/2021 1430   NA 140 11/18/2018 1544   K 3.8 01/12/2021 1430   CL 108 01/12/2021 1430   CO2 22 01/12/2021 1430   GLUCOSE 94 01/12/2021 1430   BUN 14 01/12/2021 1430   BUN 20 11/18/2018 1544   CREATININE 0.82 01/12/2021 1430   CALCIUM 8.9 01/12/2021 1430   PROT 6.6 01/12/2021 1430    ALBUMIN 3.9 01/12/2021 1430   AST 12 (L) 01/12/2021 1430   ALT 11 01/12/2021 1430   ALKPHOS 46 01/12/2021 1430   BILITOT 0.5 01/12/2021 1430   GFRNONAA >60 01/12/2021 1430   GFRAA >60 09/02/2019 0249   GFRAA >60 01/27/2019 0811       Component Value Date/Time   WBC 4.5 01/12/2021 1430   RBC 4.32 01/12/2021 1430   HGB 12.8 01/12/2021 1430   HGB 12.9 07/17/2019 0953   HCT 38.5 01/12/2021 1430   HCT 21.9 (L) 12/25/2018 1221   PLT 272 01/12/2021 1430   PLT 286 07/17/2019 0953   MCV 89.1 01/12/2021 1430   MCH 29.6 01/12/2021 1430   MCHC 33.2 01/12/2021 1430   RDW 12.4 01/12/2021 1430   LYMPHSABS 1.4 01/12/2021 1430   MONOABS 0.4 01/12/2021 1430   EOSABS 0.0 01/12/2021 1430   BASOSABS 0.0 01/12/2021 1430    No results found for: "POCLITH", "LITHIUM"   No results found for: "PHENYTOIN", "PHENOBARB", "VALPROATE", "CBMZ"   .res Assessment: Plan:    Recurrent major depression resistant to treatment ([place])  Generalized anxiety disorder  Attention deficit hyperactivity disorder (ADHD), predominantly inattentive type  Insomnia due to mental  condition  Accelerated hypertension   She has treatment resistant major depression at this time.  We discussed some of her recent abnormal behaviors leading to this depressive episode getting worse which she says were associated with heavy use of delta 8 and not a manic episode.  She realizes now that that was not good for her.  She stopped all use of other drugs including those available over-the-counter such as delta 8 or any other THC related products.  She is no longer having any of those types of behaviors and instead is depressed. She remains persistently depressed with lack of interest and lack of feeling for things that normally she would have feelings about.  She has low motivation and energy.  She is sad and down.  She is less productive than usual.  Her concentration is poor.  She has high degree of anxiety as well. She has a very  negative self-image and low self-esteem.  These are all uncharacteristic for her  Failed multiple antidepressants.  Many of them were not actual failures but intolerances and it is unclear whether some of that was more connected with anxiety than true side effects.  1 example is the [place].  In general she does not want to try anything but an antidepressant but has failed all major categories of antidepressants except TCAs and MAO inhibitors which have not been tried.  Could consider a TCA but given her manic response to delta 8 there is some concern about the risk of a TCA triggering mania.  It could be combined with lithium but she generally is unfavorable about taking lithium.  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  She wants to continue Spravato.  We discussed discussed the side effects in detail as well as the protocol required to receive Spravato.  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  When she stopped it she could not sleep.    She has started Auvelity 1 in the morning for 1 week and has tolerated it well without anxiety. increased Auvelity to 1 twice daily . If this works well we will consider tapering the Seroquel  Has been taking Seroquel XR since 03/20/2022.  For now continue 300 mg nightly for treatment resistant depression and anxiety  Started Spravato 84 mg twice weekly on 03/16/2022.  Patient was administered Spravato 84 mg intranasally today.  The patient experienced the typical dissociation which gradually resolved over the 2-hour period of observation.  There were no complications.  Specifically the patient did not have nausea or vomiting or headache.  Blood pressures remained within normal ranges at  the 40-minute and 2-hour follow-up intervals.  By the time the 2-hour observation period was met the patient was alert and oriented and able to exit without assistance.  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  See nursing note for further details. BP ok today  Unfortunately she remains depressed.  Discussed alternatives.  Adderall to XR 20 mg AM Clonidine increased to 0.2 mg twice daily off label for anxiety She agrees to continue Seroquel XR 300 mg nightly for treatment resistant depression as well as sleep and anxiety.  But she is ambivalent about the  medicine Ok temporary Ativan 1 mg 3 times daily as needed anxiety Is not ideal to use benzodiazepine with stimulant but because of the severity of her symptoms it has been necessary.  Hope to eventually eliminate the benzodiazepine.  Expected as her depression improves her anxiety will improve as well.  She needs to continue to have a conversation with primary care doctor about her blood pressure medicines.  She was prescribed Bystolic but is taking it intermittently.  Take Bystolic consistently per PCP.  B blood pressure was better today  Discussed potential benefits, risks, and side effects of stimulants with patient to include increased heart rate, palpitations, insomnia, increased anxiety, increased irritability, or decreased appetite.  Instructed patient to contact office if experiencing any significant tolerability issues. She wants to return to usual dose of Adderall for ADD bc of mor poor cognitive function with reduction.  Also discussed that depression will impair cognitive function.  Discussed safety plan at length with patient.  Advised patient to contact office with any worsening signs and symptoms.  Instructed patient to go to the [facility] emergency room for evaluation if experiencing any acute safety concerns, to include suicidal  intent.  Has Maintained sobriety  FU with Spravato twice weekly  Lynder Parents, MD, DFAPA     Please see After Visit Summary for patient specific instructions.  No future appointments.         No orders of the defined types were placed in this encounter.      -------------------------------

## 2022-04-23 ENCOUNTER — Ambulatory Visit (INDEPENDENT_AMBULATORY_CARE_PROVIDER_SITE_OTHER): Payer: 59 | Admitting: Psychiatry

## 2022-04-23 ENCOUNTER — Encounter: Payer: Self-pay | Admitting: Psychiatry

## 2022-04-23 ENCOUNTER — Ambulatory Visit: Payer: 59

## 2022-04-23 VITALS — BP 131/80 | HR 59

## 2022-04-23 DIAGNOSIS — F5105 Insomnia due to other mental disorder: Secondary | ICD-10-CM

## 2022-04-23 DIAGNOSIS — F411 Generalized anxiety disorder: Secondary | ICD-10-CM

## 2022-04-23 DIAGNOSIS — F339 Major depressive disorder, recurrent, unspecified: Secondary | ICD-10-CM | POA: Diagnosis not present

## 2022-04-23 DIAGNOSIS — F900 Attention-deficit hyperactivity disorder, predominantly inattentive type: Secondary | ICD-10-CM | POA: Diagnosis not present

## 2022-04-23 DIAGNOSIS — I10 Essential (primary) hypertension: Secondary | ICD-10-CM

## 2022-04-23 NOTE — Progress Notes (Signed)
Laura Chang [id number] 27-Jan-1956 66 y.o.  Subjective:   Patient ID:  Laura Chang is a 66 y.o. (DOB 1956/07/14) female.  Chief Complaint:  Chief Complaint  Patient presents with   Follow-up   Depression   ADD   Anxiety     HPI Laura Chang presents to the office today for follow-up of depression and anxiety and ADD.  seen November 12, 2019.  Melted down in 2020.  Went to [employer] in July.  No withdrawal.  1 drink since then.  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 She was on no stimulant and no SSRI but was taking Strattera and Wellbutrin.  The following changes were made. Stop Strattera. OK restart stimulant bc severe ADD Restart Adderall 1 daily for a few days and if tolerated then restart 1 twice daily. If not tolerated reduce the dosage if needed. May need to stop Wellbutrin if not tolerating the stimulant.  Yes.  DC Wellbutrin Restart Prozac 20 mg daily.  February 2021 appointment with the following noted: Completed grant proposal.  Couldn't doit without Adderall.  Sold a bunch of work.   Adderall XR lasts about 3 pm.  Strength seems about right.  BP been OK.  Not jittery.   Stopped Wellbutrin but had no SE. Mood drastically better with grant proposal and back on fluoxetine.  Less depressed and lethargic.  No anxiety.  Cut back on coffee. Started back with devotions and stronger faith. Plan: Continue Prozac 20 mg daily. May have to increase the dose at some point in the future given that she usually was taking higher dosages but she is getting good response at this time. Restart Wellbutrin off label for ADD since can't get 2 ADDERALL daily. 150 mg daily then 300 mg daily. She can adjust the dose between 150 mg and 300 mg daily to get the optimal effect.   05/11/2020 appointment with the following noted: Has been inconsistent with Prozac and Wellbutrin. Not sure of the effect of Wellbutrin. Biggest deterrent in work is anxiety.   Some of the work is conceptual and difficult at times.  Can feel she's not up to a project at times.  Overall is OK but would like a steadier benefit from stimulant.  Exhausted from managing concentration and keeping up with things from the day.  Loses things.  Not good keeping up with schedule. Overall productive and emotionally OK. Can feel Adderall wear off. Mood is better in summer and worse in the winter.   F died in 2023/10/01 and that is a loss. No SE Wellbutrin. Still attends AA meetings.  Real benefit from [place] last year. Recognizes effect of anemia on ADD and mood.  Had iron infusions last winter. Plan:  Wellbutrin off label for ADD since can't get 2 ADDERALL daily. 150 mg daily then 300 mg daily.  01/24/2021 appointment with following noted: Doing a program called [employer] mindfulness app since Xmas.  CBT app helped the depression.  App helped her focus better.  Lost sign weight. Writing a lot. Before Xmas felt depressed and started negative thinking worse, self denigrating. Not drinking. More isolated.   Recognizes mo is narcissist.    Didn't tell anyone she was born until 3 mos later.  M aloof and uninterested in pt.  Lied about her birthday.  Mo lack of affection even with pt's kids. Going to [place] for a year and it helped her to quit drinking. Also misses kids being gone  with a hole also. Plan: No med changes  05/04/2021 appointment with the following noted: Therapist Laura Chang thinks she's manic. Lost weight to 144#.   States she is still sleeping okay.  Admits she is hyper and recognizes that she is likely manic.  She feels great, euphoric with an increased sense of spiritual connectedness to God.  She has racing thoughts and talks fast and talks a lot and this is noted by her husband.  He thinks she is a bit hyper.  She has been able to maintain sobriety although she will have 1 glass of wine on special occasions but does not drink by herself.  She is not drinking to  excess.  She denies any dangerous impulsivity.  She is clearly not depressed and not particularly anxious.  She has no concerns about her medication and she has been compliant.  06/16/21 appt noted: So much better.  Going through a lot but the manic thing happened on top of it.  So much slower.  Didn't feel like losing anything with risperidone.  Likes the Adderalll at 10 mg. Some drowsiness in the AM and very drowsy from risperidone 2 mg HS. Prayer life is better. Handling stress better. Less depressed with risperidone. Still likes trazodone. Sleeps well. Plan: Reduce Prozac to 10 mg daily.  Consider stopping it because it can feel the mania however she is reluctant to do that because she fears relapse of depression. Reduce risperidone to 1.5 mg nightly due to side effects.  Discussed risk of worsening mania.  07/25/2021 appointment with the following noted: Misses the Adderall and hard to function without it. Depressed now. Heavy chest.  Anxious and guilty.  Body feels heavy.   Hates Wellbutrin.   Plan: Increase fluoxetine to 20 mg daily Add Abilify 1/2 of 15 mg tablet daily Wean wellbutrin by 1 tablet each week  bc she feels it is not helpful and DT polypharmacy Reduce risperidone to 1 daily for 1 week and stop it. Disc risk of mania. Increase Adderall to XR 20 mg AM  08/08/21 Much less depressed and starting to feel normal I feel a lot better. No SE.  Speech normal off risperidone. Sleeping OK on trazaodone and enough.   Noticed benefit from Adderall again. Plan: continue fluoxetine to 20 mg daily Continue Abilify 1/2 of 15 mg tablet daily for depression and mania continue Adderall to XR 20 mg AM  10/10/2021 phone call: Pt stated she feels like the Abilify should be decreased to 23m.She said she is depressed but rational and not suicidal.She has an appt Monday and can wait until then if you prefer. MD response: Reduce the Abilify to 7.5 mg every other day.  We will meet on 10/16/2021  and decide what to do from there.  10/16/2021 appointment with the following noted: More depressed.  Most depressed I've ever been.  Just numb.  Sense of grief.   Thinks the manic episode was unlike anything else she ever had.  Doesn't want to medicate against it.  Don't enjoy people.  Easily overwhelmed.  Had some death thoughts but not suicidal.  Has been functional.  Feels better today after reducing Abilify to every other day but she is only been doing that for 3 days. A/P: Episode of post manic depression was explained. continue fluoxetine to 20 mg daily Hold Abilify for 1 week then resume Abilify 1/2 of 15 mg tablet every other day for depression and mania continue Adderall to XR 20 mg AM  10/27/2021  appointment with the following noted: I'm doing so much better.  Handling the depression better. Better self talk and spiritual focus has helped.   Dep 6/10 manifesting as anxiety with low confidence.   F died 2  years ago and M 66 yo and is dependent . She is working hard to feel better but still feels depressed.  She almost feels like she has a little more anxiety since restarting Abilify every other day. Plan: continue fluoxetine to 20 mg daily DC Abilify .  Vrayalar 1.5 mg QOD to try to get rid of depression ASAP. continue Adderall to XR 20 mg AM  11/10/2021 appointment with the following noted: Busy with Xmas and it was fun with family but then a big let down.  Did well with it.  Functioned well with it.  Working hard on things with depression.  Not shutting down. Not sure but feels better today but yesterday was hard.  Difficulty dealing with mother.  She won't do anything to help herself.  Yesterday with her all day.  Won't do PT and has isolated herself.    Lack of confidence.   No SE with Vraylar.  11/24/21 urgent appointment appt noted: More and more depressed.   So anxious and doesn't want to be alone but can do so. No appetite. Hurts inside. Has had some fleeting suicidal  thoughts but would not act on them.  Tolerating meds. Has been consistent with Vraylar 1.5 mg every other day, fluoxetine 20 mg daily Plan: Increase Vraylar to 1.5 mg daily Change Prozac to Trintellix 10 mg daily. Discussed side effects of each continue Adderall to XR 20 mg AM  12/27/2021 appointment with the following noted: Not OK.  I feel less depressed but feels bat shit. Not sleeping well.  Extremely anxious. Off and on sleep. 3-4 hours of sleep.   Still having daily SI.  But also become obvious has so much to do.  Overwhelmed by tasks.   Needs anxiety meds to just function. Not more motivated.  Walked yesterday.   Feels afraid like in trouble but not irritable or angry. DC DT agitation Vraylar to 1.5 mg daily Change Prozac to Trintellix 10 mg daily. Hold Adderall to XR 20 mg AM Clonidine 0.1 1/2 tablet twice daily for 2 days and if needed for anxiety and sleep increase to 1 twice daily Ok temporary Ativan 1 mg 3 times daily as needed anxiety  01/05/22 appt noted: Off fluoxetine and  Trintellix.  Only on Ativan, trazodone and Adderall XR 20 plus added clonidine 0.1 mg BID Didn't think she needed to start Trintellix. Not taking Ativan.   Didn't like herself last week. Feels some better today. Wonders if the manic sx Not agitated.  Anxiety kind of calmed down.  A lot to be anxious about situationally.  $ stress. Concerns about downers with meds. Can't access normal personality. ? Lethargy and inability to talk as sE. Plan: Latuda 20-40 mg daily with food. Adderall to XR 20 mg AM Clonidine 0.1 1/2 tablet twice daily  reduce dose to be sure no SE Ok temporary Ativan 1 mg 3 times daily as needed anxiety  01/19/22 appt noted: Taking Latuda 20 mg daily.  Took 40 mg once and felt anxious and  SI Still depressed and not very reactive Anxiety mainly about the depression and fears of the future. She wants to revisit manic sx and thinks it was maybe bc taking delta 8 bc was taking a lot  of it so still  doesn't think she's classic bipolar. She wants to only take Prozac bc thinks Latuda is perpetuating depression. Says the delta 8 was very psychaedelic.  When not taking it was not manic.  Sleeping ok again.  Plan: Per her request DC Latuda 20-40 mg daily with food. She wants to continue Prozac alone AMA  Adderall to XR 20 mg AM Clonidine 0.1 1/2 tablet twice daily  reduce dose to be sure no SE Ok temporary Ativan 1 mg 3 times daily as needed anxiety  01/23/2022 phone call complaining of increased anxiety since stopping Latuda.  She will try increasing clonidine.  01/26/2022 phone call not feeling well and wanted to restart the Vraylar.  However notes indicate that had made her agitated therefore she was encouraged to pick up samples of Rexulti 1 mg and start that instead.  02/06/2022 phone call: Stating she felt the Rexulti was helping with depression but she was not sleeping well and obsessing over things.  She was encouraged to increase Rexulti to 2 mg daily and increase trazodone for sleep.  02/09/2022 appointment with the following noted: This was an urgent work in appointment No sleep last night with trazodone 100 mg HS Nothing really better depression or anxiety. Ruminating negative anxious thoughts. Did not tolerate Rexulti because it was causing insomnia.  Does not think it helped depression.  Lacks emotion that she should have.  Lacks her usual personality.  Some hopeless thoughts.  Some death thoughts.  Some suicidal thoughts without plan or intent Plan: DC Rexulti and Prozac & DC trazodone Adderall to XR 20 mg AM Clonidine 0.1 1/tablet twice daily  reduce dose to be sure no SE Ok temporary Ativan 1 mg 3 times daily as needed anxiety Start Seroquel XR 150 mg nightly  03/02/2022 appointment: Langley Gauss called back a few days after starting Seroquel stating it was making her more anxious and more depressed.  This seemed unlikely as this medicine rarely ever causes anxiety.   She stopped the medication waited 3 days and called back still had anxiety and depression but thought perhaps the anxiety was a little better.  She did not want to take the Seroquel. She knew about the option of Spravato and wanted to pursue that. Now questions whether to return to Seroquel while waiting to start Spravato bc feels just as bad without it and knows she didn't give it enough time to work.   MADRS 46  ECT-MADRS    Flowsheet Row Office Visit from 03/02/2022 in [facility]  MADRS Total Score 46      03/14/22 appt noted: Pt received Spravato 56 mg first dose today with some dissociative sx which were not severe.  She was anxious prior to the administration and felt better after receiving lorazepam 1 mg.  No NV, or HA. Wants to continue Spravato. Ongoing depression and desperate to feel better.  I'm not myself DT deprsssion which is most severe in recent history.  Anhedonia.  Low motivation.  Social avoidance. Continues to think all recent med trials are making her worse.  Sleep ok with Seroquel.  03/16/22 appt noted: Received Spravato 84 mg for the first time.  some dissociative sx which were not severe.  She was anxious prior to the administration and felt better after receiving lorazepam 1 mg.  No NV, or HA. Wants to continue Spravato.   Does not feel any better or different since the last appt.  Ongoing depression.  Ongoing depression and desperate to feel better.  I'm not  myself DT deprsssion which is most severe in recent history.  Anhedonia.  Low motivation.  Social avoidance. Continues to think all recent med trials are making her worse.  Sleep ok with Seroquel.  Does not want to continue Seroquel for TRD.  03/20/2022 appointment noted: Came for Spravato administration today.  However blood pressure was significantly elevated approximately 180/115.  She was given lorazepam 1 mg and clonidine 0.2 mg to try to get it down. She states she regretted stopping the  Seroquel XR 300 mg tablets.  She now realizes it was helpful.  She did not sleep much at all last night.  She did not take the Adderall this morning. 2 to 3 hours after arrival blood pressure was still elevated at  170/110, 62 pulse.  For Spravato administration was canceled for today.  She admits to being anxious and depressed.  She is not suicidal.  She is highly motivated to receive the Spravato.  We discussed getting it tomorrow.  03/22/2022 appointment noted: Patient's blood pressure was never stable enough yesterday in order to get her in for Spravato administration.  She was encouraged to see her primary care doctor.  It is better today.  03/26/2022 appointment with the following noted: Blood pressure was better.  Saw her primary care doctor who started on oral Bystolic 2.5 mg daily. Received Spravato 84 mg today as scheduled.  Tolerated it well without nausea or vomiting headache or chest pain or palpitations.  Her blood pressure was borderline but manageable. She remains depressed and anxious.  She is ambivalent about the medicine and desperate to get to feel better.  Continues to have anhedonia and low energy and low motivation and reduced ability to do things.  Less social.  Not suicidal.  03/28/22 appt noted: Received Spravato 84 mg today as scheduled.  Tolerated it well without nausea or vomiting headache or chest pain or palpitations.  Her blood pressure was borderline but manageable. Has not seen any improvement so far.  Tolerating Seroquel.  Inconsistent with Bystolic and BP has been borderline high. Still depressed and anxious and anhedonia.  Low motivation, energy, productivity. Taking quetiapine and tolerating XR 300 mg nightly.  04/04/22 appt noted: Received Spravato 84 mg today as scheduled.  Tolerated it well without nausea or vomiting headache or chest pain or palpitations.  Her blood pressure was borderline but manageable. Has not seen any improvement so far.  Tolerating  Seroquel.   She still tends to think that the medications are making her worse.  She has said this about each of the recent psychiatric medicines including Seroquel.  However her husband thinks she is improved.  She also admits there is some improvement in productivity.  She still feels highly anxious.  She still does not enjoy things as normal.  She still feels desperate to improve as soon as possible. Has been taking Seroquel XR since 03/20/2022  04/10/22 appt noted: Received Spravato 84 mg today as scheduled.  Tolerated it well without nausea or vomiting headache or chest pain or palpitations.  Her blood pressure was borderline but manageable. Has not seen any improvement so far.  Tolerating Seroquel.  Doesn't like Seroquel bc she thinks it flattens here. 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  Tolerated it well without nausea or vomiting headache or chest pain or palpitations.  Her blood pressure was borderline but manageable. Has  not seen any improvement so far.  Tolerating Seroquel.  Doesn't like Seroquel bc she thinks it flattens her. Received Spravato 84 mg today as scheduled.  Tolerated it well without nausea or vomiting headache or chest pain or palpitations.  Her blood pressure was borderline but manageable. Has not seen any improvement so far.  Tolerating Seroquel.  Doesn't like Seroquel bc she thinks it flattens here.  We discussed her ambivalence about it. She is starting Auvelity and has tolerated it the last 2 days without side effect.  She still does not feel like herself and feels flat and not enjoying things with suppressed expressed emotion  04/17/2022 appointment with the following noted: Received Spravato 84 mg today as scheduled.  Tolerated it well without nausea or vomiting headache or chest pain or  palpitations.  Her blood pressure was borderline but manageable. Has not seen any improvement so far.  Tolerating Seroquel.  Doesn't like Seroquel bc she thinks it flattens her. She has been tolerating the Auvelity 1 in the morning without side effects for about a week.  She has not noticed significant improvement so far.  She still feels depressed and flat and not herself.  Other people notice that she is flat emotionally.  She is not suicidal.  She does feel discouraged that she is not getting better yet.  04/19/2022 appointment noted: Has increased Auvelity to 1 twice daily for 2 days, continues quetiapine XR 300 mg nightly, clonidine 0.3 mg twice daily, lorazepam 1 mg twice daily for anxiety and Adderall XR 20 mg in the morning. No obious SE but she still thinks quetiapine XR is making her feel down.  But not sedated Received Spravato 84 mg today as scheduled.  Tolerated it well without nausea or vomiting headache or chest pain or palpitations.  Her blood pressure was borderline but manageable. She still feels quite anxious and feels it necessary to take both the clonidine and lorazepam twice a day to manage her anxiety.  She has been consistently down and flat and not herself until yesterday afternoon she noted an improvement in mood and feeling much more like herself with her normal personality reemerging.  She was quite depressed in the morning with very dark negative thoughts.  She did not have those dark negative thoughts this morning.  She had a lot of questions about medication and when she was expecting to be improved and why she has not shown improvement up to now.  04/23/22 appt noted: Has increased Auvelity to 1 twice daily for 1 week, continues quetiapine XR 300 mg nightly, clonidine 0.3 mg twice daily, lorazepam 1 mg twice daily for anxiety and Adderall XR 20 mg in the morning. No obious SE but she still thinks quetiapine XR is making her feel down.  But not sedated Received Spravato 84  mg today as scheduled.  Tolerated it well without nausea or vomiting headache or chest pain or palpitations.  She is still depressed but admits better function and is able to enjoy social interactions. Tolerating meds.  Would like to feel better for sure. Not herself.  Flat.  Past Psychiatric Medication Trials: fluoxetine, duloxetine, Viibryd, lamotrigine, Pristiq, sertraline, citalopram,  Trintellix anxious and SI Wellbutrin  Adderall, Adderall XR, Vyvanse, Ritalin, Strattera low dose NR Lorazepam Trazodone  Depakote,  lamotrigine cog complaints Lithium remotely Abilify 7.5  Vraylar 1.5 mg daily agitation and insomnia Rexulti insomnia Latuda 40 one dose, CO anxious and SI Seroquel XR 300  At visit November 12, 2019. We discussed Patient  developed an increasingly severe alcohol dependence problem since her last visit in January.  She went to [facility] and has had no alcohol since then except 1 day.  She never abused stimulants but they took her off the stimulants at [facility].  Her ADD was markedly worse.  The Wellbutrin did not help the ADD.   D history lamotrigine rash at 66 yo  Review of Systems:  Review of Systems  Constitutional:  Positive for fatigue.  Cardiovascular:  Negative for chest pain and palpitations.  Musculoskeletal:  Positive for arthralgias, back pain and joint swelling.       SP hip surgery October 2020  Psychiatric/Behavioral:  Positive for decreased concentration, dysphoric mood and sleep disturbance. Negative for behavioral problems, confusion, hallucinations, self-injury and suicidal ideas. The patient is nervous/anxious. The patient is not hyperactive.     Medications: I have reviewed the patient's current medications.  Current Outpatient Medications  Medication Sig Dispense Refill   [START ON 05/15/2022] amphetamine-dextroamphetamine (ADDERALL XR) 20 MG 24 hr capsule Take 1 capsule (20 mg total) by mouth every morning. 30 capsule 0    amphetamine-dextroamphetamine (ADDERALL XR) 20 MG 24 hr capsule Take 1 capsule (20 mg total) by mouth every morning. 30 capsule 0   cloNIDine (CATAPRES) 0.1 MG tablet TAKE 1 TABLET BY MOUTH TWICE A DAY 60 tablet 0   cloNIDine (CATAPRES) 0.2 MG tablet TAKE 1 TABLET BY MOUTH 2 TIMES DAILY. 60 tablet 0   Dextromethorphan-buPROPion ER (AUVELITY) 45-105 MG TBCR Take 1 tablet by mouth 2 (two) times daily. 60 tablet 1   Esketamine HCl, 84 MG Dose, (SPRAVATO, 84 MG DOSE,) 28 MG/DEVICE SOPK USE 3 SPRAYS IN EACH NOSTRIL TWICE A WEEK 3 each 5   iron polysaccharides (NIFEREX) 150 MG capsule TAKE 1 CAPSULE BY MOUTH EVERY DAY 30 capsule 1   LORazepam (ATIVAN) 1 MG tablet Take 1 tablet (1 mg total) by mouth every 8 (eight) hours. 90 tablet 0   nebivolol (BYSTOLIC) 2.5 MG tablet Take 2.5 mg by mouth daily.     QUEtiapine (SEROQUEL XR) 300 MG 24 hr tablet Take 1 tablet (300 mg total) by mouth at bedtime. 30 tablet 0   No current facility-administered medications for this visit.    Medication Side Effects: None  Allergies:  Allergies  Allergen Reactions   Metronidazole Shortness Of Breath and Other (See Comments)    Heart pounding   Ferrlecit [Na Ferric Gluc Cplx In Sucrose] Other (See Comments)    Infusion reaction 05/12/2019    Past Medical History:  Diagnosis Date   ADHD    Anemia    Anxiety    Arthritis    Depression    Heart murmur    i went to see a cardiologit slast eyar  and i had zero plaque,    PONV (postoperative nausea and vomiting)    Recovering alcoholic in remission ([place])     Family History  Problem Relation Age of Onset   Atrial fibrillation Mother    CAD Father     Social History   Socioeconomic History   Marital status: Married    Spouse name: Not on file   Number of children: Not on file   Years of education: Not on file   Highest education level: Not on file  Occupational History   Not on file  Tobacco Use   Smoking status: Former    Types: Cigarettes    Quit  date: 08/16/2003    Years since quitting: 48.6  Smokeless tobacco: Never   Tobacco comments:     08-28-2019 "i smoked 2 cigarettes in the last month since my father  passed"  Vaping Use   Vaping Use: Never used  Substance and Sexual Activity   Alcohol use: Yes    Alcohol/week: 10.0 standard drinks of alcohol    Types: 10 Glasses of wine per week   Drug use: No   Sexual activity: Not on file  Other Topics Concern   Not on file  Social History Narrative   Not on file   Social Determinants of Health   Financial Resource Strain: Not on file  Food Insecurity: Not on file  Transportation Needs: Not on file  Physical Activity: Not on file  Stress: Not on file  Social Connections: Not on file  Intimate Partner Violence: Not on file    Past Medical History, Surgical history, Social history, and Family history were reviewed and updated as appropriate.   Please see review of systems for further details on the patient's review from today.   Objective:   Physical Exam:  There were no vitals taken for this visit.  Physical Exam Constitutional:      General: She is not in acute distress. Neurological:     Mental Status: She is alert and oriented to person, place, and time.     Coordination: Coordination normal.     Gait: Gait normal.  Psychiatric:        Attention and Perception: Perception normal. She is attentive.        Mood and Affect: Mood is anxious and depressed. Affect is blunt. Affect is not labile, angry, tearful or inappropriate.        Speech: Speech is not rapid and pressured, slurred or tangential.        Behavior: Behavior is not slowed or aggressive.        Thought Content: Thought content is not paranoid or delusional. Thought content does not include homicidal or suicidal ideation. Thought content does not include suicidal plan.        Cognition and Memory: Cognition normal. She does not exhibit impaired recent memory.        Judgment: Judgment normal.      Comments: Insight intact. No auditory or visual hallucinations. No delusions.  Depression  ongoing. Affect with some smiling but still depressed and more blunted than usual. No Sui intent plan      Lab Review:     Component Value Date/Time   NA 137 01/12/2021 1430   NA 140 11/18/2018 1544   K 3.8 01/12/2021 1430   CL 108 01/12/2021 1430   CO2 22 01/12/2021 1430   GLUCOSE 94 01/12/2021 1430   BUN 14 01/12/2021 1430   BUN 20 11/18/2018 1544   CREATININE 0.82 01/12/2021 1430   CALCIUM 8.9 01/12/2021 1430   PROT 6.6 01/12/2021 1430   ALBUMIN 3.9 01/12/2021 1430   AST 12 (L) 01/12/2021 1430   ALT 11 01/12/2021 1430   ALKPHOS 46 01/12/2021 1430   BILITOT 0.5 01/12/2021 1430   GFRNONAA >60 01/12/2021 1430   GFRAA >60 09/02/2019 0249   GFRAA >60 01/27/2019 0811       Component Value Date/Time   WBC 4.5 01/12/2021 1430   RBC 4.32 01/12/2021 1430   HGB 12.8 01/12/2021 1430   HGB 12.9 07/17/2019 0953   HCT 38.5 01/12/2021 1430   HCT 21.9 (L) 12/25/2018 1221   PLT 272 01/12/2021 1430   PLT 286 07/17/2019 0953  MCV 89.1 01/12/2021 1430   MCH 29.6 01/12/2021 1430   MCHC 33.2 01/12/2021 1430   RDW 12.4 01/12/2021 1430   LYMPHSABS 1.4 01/12/2021 1430   MONOABS 0.4 01/12/2021 1430   EOSABS 0.0 01/12/2021 1430   BASOSABS 0.0 01/12/2021 1430    No results found for: "POCLITH", "LITHIUM"   No results found for: "PHENYTOIN", "PHENOBARB", "VALPROATE", "CBMZ"   .res Assessment: Plan:    Recurrent major depression resistant to treatment ([place])  Generalized anxiety disorder  Attention deficit hyperactivity disorder (ADHD), predominantly inattentive type  Insomnia due to mental condition  Accelerated hypertension   She has treatment resistant major depression at this time.  We discussed some of her recent abnormal behaviors leading to this depressive episode getting worse which she says were associated with heavy use of delta 8 and not a manic episode.  She realizes now  that that was not good for her.  She stopped all use of other drugs including those available over-the-counter such as delta 8 or any other THC related products.  She is no longer having any of those types of behaviors and instead is depressed. She remains persistently depressed with lack of interest and lack of feeling for things that normally she would have feelings about.  She has low motivation and energy.  She is sad and down.  She is less productive than usual.  Her concentration is poor.  She has high degree of anxiety as well. She has a very negative self-image and low self-esteem.  These are all uncharacteristic for her  Failed multiple antidepressants.  Many of them were not actual failures but intolerances and it is unclear whether some of that was more connected with anxiety than true side effects.  1 example is the [place].  In general she does not want to try anything but an antidepressant but has failed all major categories of antidepressants except TCAs and MAO inhibitors which have not been tried.  Could consider a TCA but given her manic response to delta 8 there is some concern about the risk of a TCA triggering mania.  It could be combined with lithium but she generally is unfavorable about taking lithium.  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  She wants to continue Spravato.  We discussed discussed the side effects in detail as well as the protocol required to receive Spravato.  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  When she stopped it she could not sleep.  However it is not effective for depression. Has been taking Seroquel XR since 03/20/2022.   Reduce Seroquel XR to 1/2 tablet of $RemoveB'300mg'[id number]$  size at  night  increased Auvelity to 1 twice daily for the last week. Buddy Duty Spravato 84 mg twice weekly on 03/16/2022.  Patient was administered Spravato 84 mg intranasally today.  The patient experienced the typical dissociation which gradually resolved over the 2-hour period of observation.  There were no complications.  Specifically the patient did not have nausea or vomiting or headache.  Blood pressures remained within normal ranges at the 40-minute and 2-hour follow-up intervals.  By the time the 2-hour observation period was met the patient was alert and oriented and able to exit without assistance.  Patient feels  the Spravato administration is helpful for the treatment resistant depression and would like to continue the treatment.  See nursing note for further details. BP ok today  Unfortunately she remains depressed.  Discussed alternatives.  Adderall to XR 20 mg AM Clonidine increased to 0.2 mg twice daily off label for anxiety  Ok temporary Ativan 1 mg 3 times daily as needed anxiety but try to cut it back. Is not ideal to use benzodiazepine with stimulant but because of the severity of her symptoms it has been necessary.  Hope to eventually eliminate the benzodiazepine.  Expected as her depression improves her anxiety will improve as well.  She needs to continue to have a conversation with primary care doctor about her blood pressure medicines.  She was prescribed Bystolic but is taking it intermittently.  Take Bystolic consistently per PCP.  B blood pressure was better today  Discussed potential benefits, risks, and side effects of stimulants with patient to include increased heart rate, palpitations, insomnia, increased anxiety, increased irritability, or decreased appetite.  Instructed patient to contact office if experiencing any significant tolerability issues. She wants to return to usual dose of Adderall for ADD bc of mor poor cognitive function with reduction.  Also discussed  that depression will impair cognitive function.  Discussed safety plan at length with patient.  Advised patient to contact office with any worsening signs and symptoms.  Instructed patient to go to the [facility] emergency room for evaluation if experiencing any acute safety concerns, to include suicidal intent.  Has Maintained sobriety  FU with Spravato twice weekly  Lynder Parents, MD, DFAPA     Please see After Visit Summary for patient specific instructions.  No future appointments.         No orders of the defined types were placed in this encounter.      -------------------------------

## 2022-04-24 ENCOUNTER — Telehealth: Payer: Self-pay | Admitting: Psychiatry

## 2022-04-24 ENCOUNTER — Telehealth: Payer: Self-pay

## 2022-04-24 NOTE — Telephone Encounter (Signed)
RTC Was anxious and irritable. Did not take extra lorazepam but just the normal dose.   This felt different than her usual depression.  It was an unusual feeling of anxiety and fearfulness and irritability and restlessness.  We recently have added Auvelity and she just increased the dose to twice a day yesterday.  However in addition we also reduced the dose of Seroquel XR 300 mg nightly to 150 with a split tablet of the 300 mg.  So 2 changes were made close together which could potentially increase anxiety.  Suggested that she continue the Auvelity 1 each morning and continue one half Seroquel each night.  But hold the second Auvelity until we are sure that she has adjusted to the reduction in Seroquel.  Also suggested that if she has the symptom again that she take an extra lorazepam which should help resolve the symptoms very quickly.  She will proceed with Spravato tomorrow as planned.  She agrees with the plan. Meredith Staggers, MD, DFAPA

## 2022-04-24 NOTE — Telephone Encounter (Signed)
Pt LVM stating she is feeling real funny. She stated feeling distrubed. Asking nurse to RTC @ [phone]. Called @ 2:57

## 2022-04-24 NOTE — Progress Notes (Signed)
Nurse visit:   Patient arrived for her 71 th Spravato treatment. Pt is being treated for Treatment Resistant Depression, pt will be receiving 84 mg which will continue to be her maintenance dose. Patient arrived and taken to treatment room.  Answered any questions and concerns the patient had since her last treatment and what to expect today. Confirmed she had a ride home which is her husband would be coming back to pick up pt when done.  Pt's Spravato is ordered through [employer] and delivered to office, all Spravato medication is stored at doctors office per REMS/FDA guidelines. The medication is required to be locked behind two doors per FDA/REMS Protocol. Medication is also disposed of properly per regulations.      Began taking patient's vital signs at 9:45 AM 118/71, pulse 68. Instructed patient to blow her nose then recline back to a 45 degree angle. Gave patient first dose 28 mg nasal spray, patient is still trying to get the hang of using the nasal sprays, each nasal spray administered in each nostril as directed and waited 5 minutes between the second and third dose. After all 3 doses given pt did not complain of any nausea/vomiting, given a cup of water due to the taste after the administration of Spravato. Patient doing well, and is experiencing dissociation, she really likes the relaxation it makes her feel. Checked 40 minute vitals at 10:25 AM, 108/67, pulse 60.  No complaints. Explained she would be monitored for a total time of 120 minutes. Discharge vitals were taken at 11:54  AM 119/80, P 59. Dr. Clovis Pu did come visit with patient and discuss how treatment went at the end. I walked pt to elevator, where her husband met her for the ride home. Recommend she go home and sleep or just relax on the couch. No driving, no intense activities. Verbalized understanding. Pt. will be receiving 2 treatments per week a few more weeks until her PA needs renewal on 04/30/22 then decide from there  how her treatments will be then. Nurse was with pt a total of 60 minutes for clinical assessment. Pt is scheduled next Monday, June 12th. Pt instructed to call office with any problems or questions.      LOT [id number] EXP APRIL 2025

## 2022-04-24 NOTE — Telephone Encounter (Signed)
Prior Authorization submitted and approved for AUVELITY ER 45-105 MG, PA# [id number]

## 2022-04-24 NOTE — Telephone Encounter (Signed)
Rtc to pt and she was very nervous and anxious on the phone she reports feeling out of sorts today, she has tried to walk then come back and work on a project but she can't. Asking for something to help calm her down, just temporary? She is also anxious and nervous about going out of town this weekend. She did increase her Auvelity yesterday to bid, I didn't know if that was causing this?   Informed her I would update Dr. Clovis Pu and follow up with recommendation.

## 2022-04-24 NOTE — Progress Notes (Signed)
Nurse visit:   Patient arrived for her 12th Spravato treatment. Pt is being treated for Treatment Resistant Depression, pt will be receiving 84 mg which will continue to be her maintenance dose. Patient arrived and taken to treatment room.  Answered any questions and concerns the patient had since her last treatment and what to expect today. Confirmed she had a ride home which is her husband would be coming back to pick up pt when done.  Pt's Spravato is ordered through [employer] and delivered to office, all Spravato medication is stored at doctors office per REMS/FDA guidelines. The medication is required to be locked behind two doors per FDA/REMS Protocol. Medication is also disposed of properly per regulations.      Began taking patient's vital signs at 9:50 AM 153/98, pulse 54. Pt's b/p was elevated so I wanted to hold off and recheck, next vitals were at 10:05 AM 152/94, 57 pulse, pt doesn't remember if she took her Clonidine 0.2 mg so I did give her a Clonidine 0.1 mg. Vitals rechecked at 10:20 AM 156/89, pulse 54. Pt had no symptoms she just reports rushing this morning to get here. I went ahead and asked her to blow her nose and we would get started. Dr. Clovis Pu was aware and agreed. Instructed patient to blow her nose then recline back to a 45 degree angle. Gave patient first dose 28 mg nasal spray, patient is still trying to get the hang of using the nasal sprays, each nasal spray administered in each nostril as directed and waited 5 minutes between the second and third dose. After all 3 doses given pt did not complain of any nausea/vomiting, given a cup of water due to the taste after the administration of Spravato. Patient doing well, and is experiencing dissociation, she really likes the relaxation it makes her feel. She listens to [employer] with spa or relaxing music.  Checked 40 minute vitals at 11:10 AM, 121/72, pulse 64, B/P was much better and pt relaxed.  No complaints. Explained she  would be monitored for a total time of 120 minutes. Discharge vitals were taken at 12:15  PM 131/80, P 59. Dr. Clovis Pu did come visit with patient and discuss how treatment went at the end. I walked pt to elevator, where her husband met her for the ride home. Recommend she go home and sleep or just relax on the couch. No driving, no intense activities. Verbalized understanding. Pt. will be receiving 2 treatments per week a few more weeks until her PA needs renewal on 04/30/22 then decide from there how her treatments will be then. Nurse was with pt a total of 60 minutes for clinical assessment. Pt is scheduled Wednesday, June 14th. Pt instructed to call office with any problems or questions.      LOT [id number] EXP APRIL 2025

## 2022-04-24 NOTE — Progress Notes (Signed)
Nurse visit:   Patient arrived for her 10th Spravato treatment. Pt is being treated for Treatment Resistant Depression, pt will be receiving 84 mg which will continue to be her maintenance dose. Patient arrived and taken to treatment room.  Answered any questions and concerns the patient had since her last treatment and what to expect today. Confirmed she had a ride home which is her husband would be coming back to pick up pt when done.  Pt's Spravato is ordered through [employer] and delivered to office, all Spravato medication is stored at doctors office per REMS/FDA guidelines. The medication is required to be locked behind two doors per FDA/REMS Protocol. Medication is also disposed of properly per regulations.      Began taking patient's vital signs at 9:40 AM 92/59, pulse 58. Instructed patient to blow her nose then recline back to a 45 degree angle. Gave patient first dose 28 mg nasal spray, patient is still trying to get the hang of using the nasal sprays, each nasal spray administered in each nostril as directed and waited 5 minutes between the second and third dose. After all 3 doses given pt did not complain of any nausea/vomiting, given a cup of water due to the taste after the administration of Spravato. Patient doing well, and is experiencing dissociation, she really likes the relaxation it makes her feel. Checked 40 minute vitals at 10:20 AM, 84/51, pulse 48.  No complaints. Explained she would be monitored for a total time of 120 minutes. Discharge vitals were taken at 11:40  AM 107/60, P 50. Dr. Clovis Pu did come visit with patient and discuss how treatment went at the end. I walked pt to elevator, where her husband met her for the ride home. Recommend she go home and sleep or just relax on the couch. No driving, no intense activities. Verbalized understanding. Pt. will be receiving 2 treatments per week a few more weeks until her PA needs renewal on 04/30/22 then decide from there how  her treatments will be then. Nurse was with pt a total of 60 minutes for clinical assessment. Pt is scheduled Thursday, June 8th. Pt instructed to call office with any problems or questions.      LOT [id number] EXP APRIL 2025

## 2022-04-25 ENCOUNTER — Ambulatory Visit (INDEPENDENT_AMBULATORY_CARE_PROVIDER_SITE_OTHER): Payer: 59 | Admitting: Psychiatry

## 2022-04-25 ENCOUNTER — Encounter: Payer: Self-pay | Admitting: Psychiatry

## 2022-04-25 ENCOUNTER — Ambulatory Visit: Payer: 59

## 2022-04-25 DIAGNOSIS — F411 Generalized anxiety disorder: Secondary | ICD-10-CM | POA: Diagnosis not present

## 2022-04-25 DIAGNOSIS — F339 Major depressive disorder, recurrent, unspecified: Secondary | ICD-10-CM | POA: Diagnosis not present

## 2022-04-25 DIAGNOSIS — F5105 Insomnia due to other mental disorder: Secondary | ICD-10-CM

## 2022-04-25 DIAGNOSIS — F900 Attention-deficit hyperactivity disorder, predominantly inattentive type: Secondary | ICD-10-CM | POA: Diagnosis not present

## 2022-04-25 DIAGNOSIS — I10 Essential (primary) hypertension: Secondary | ICD-10-CM

## 2022-04-25 MED ORDER — LORAZEPAM 1 MG PO TABS: 1.0000 mg | ORAL_TABLET | Freq: Four times a day (QID) | ORAL | 0 refills | Status: DC | PRN

## 2022-04-25 NOTE — Progress Notes (Signed)
[name] [id number] 1955-11-28 66 y.o.  Subjective:   Patient ID:  Laura Chang is a 66 y.o. (DOB 1956/01/31) female.  Chief Complaint:  Chief Complaint  Patient presents with   Follow-up   Depression   Anxiety   ADD     HPI Laura Chang presents to the office today for follow-up of depression and anxiety and ADD.  seen November 12, 2019.  Melted down in 2020.  Went to [employer] in July.  No withdrawal.  1 drink since then.  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 She was on no stimulant and no SSRI but was taking Strattera and Wellbutrin.  The following changes were made. Stop Strattera. OK restart stimulant bc severe ADD Restart Adderall 1 daily for a few days and if tolerated then restart 1 twice daily. If not tolerated reduce the dosage if needed. May need to stop Wellbutrin if not tolerating the stimulant.  Yes.  DC Wellbutrin Restart Prozac 20 mg daily.  February 2021 appointment with the following noted: Completed grant proposal.  Couldn't doit without Adderall.  Sold a bunch of work.   Adderall XR lasts about 3 pm.  Strength seems about right.  BP been OK.  Not jittery.   Stopped Wellbutrin but had no SE. Mood drastically better with grant proposal and back on fluoxetine.  Less depressed and lethargic.  No anxiety.  Cut back on coffee. Started back with devotions and stronger faith. Plan: Continue Prozac 20 mg daily. May have to increase the dose at some point in the future given that she usually was taking higher dosages but she is getting good response at this time. Restart Wellbutrin off label for ADD since can't get 2 ADDERALL daily. 150 mg daily then 300 mg daily. She can adjust the dose between 150 mg and 300 mg daily to get the optimal effect.   05/11/2020 appointment with the following noted: Has been inconsistent with Prozac and Wellbutrin. Not sure of the effect of Wellbutrin. Biggest deterrent in work is anxiety.   Some of the work is conceptual and difficult at times.  Can feel she's not up to a project at times.  Overall is OK but would like a steadier benefit from stimulant.  Exhausted from managing concentration and keeping up with things from the day.  Loses things.  Not good keeping up with schedule. Overall productive and emotionally OK. Can feel Adderall wear off. Mood is better in summer and worse in the winter.   F died in September 25, 2023 and that is a loss. No SE Wellbutrin. Still attends AA meetings.  Real benefit from [place] last year. Recognizes effect of anemia on ADD and mood.  Had iron infusions last winter. Plan:  Wellbutrin off label for ADD since can't get 2 ADDERALL daily. 150 mg daily then 300 mg daily.  01/24/2021 appointment with following noted: Doing a program called [employer] mindfulness app since Xmas.  CBT app helped the depression.  App helped her focus better.  Lost sign weight. Writing a lot. Before Xmas felt depressed and started negative thinking worse, self denigrating. Not drinking. More isolated.   Recognizes mo is narcissist.    Didn't tell anyone she was born until 3 mos later.  M aloof and uninterested in pt.  Lied about her birthday.  Mo lack of affection even with pt's kids. Going to [place] for a year and it helped her to quit drinking. Also misses kids being gone  with a hole also. Plan: No med changes  05/04/2021 appointment with the following noted: Therapist Laura Chang thinks she's manic. Lost weight to 144#.   States she is still sleeping okay.  Admits she is hyper and recognizes that she is likely manic.  She feels great, euphoric with an increased sense of spiritual connectedness to God.  She has racing thoughts and talks fast and talks a lot and this is noted by her husband.  He thinks she is a bit hyper.  She has been able to maintain sobriety although she will have 1 glass of wine on special occasions but does not drink by herself.  She is not drinking to  excess.  She denies any dangerous impulsivity.  She is clearly not depressed and not particularly anxious.  She has no concerns about her medication and she has been compliant.  06/16/21 appt noted: So much better.  Going through a lot but the manic thing happened on top of it.  So much slower.  Didn't feel like losing anything with risperidone.  Likes the Adderalll at 10 mg. Some drowsiness in the AM and very drowsy from risperidone 2 mg HS. Prayer life is better. Handling stress better. Less depressed with risperidone. Still likes trazodone. Sleeps well. Plan: Reduce Prozac to 10 mg daily.  Consider stopping it because it can feel the mania however she is reluctant to do that because she fears relapse of depression. Reduce risperidone to 1.5 mg nightly due to side effects.  Discussed risk of worsening mania.  07/25/2021 appointment with the following noted: Misses the Adderall and hard to function without it. Depressed now. Heavy chest.  Anxious and guilty.  Body feels heavy.   Hates Wellbutrin.   Plan: Increase fluoxetine to 20 mg daily Add Abilify 1/2 of 15 mg tablet daily Wean wellbutrin by 1 tablet each week  bc she feels it is not helpful and DT polypharmacy Reduce risperidone to 1 daily for 1 week and stop it. Disc risk of mania. Increase Adderall to XR 20 mg AM  08/08/21 Much less depressed and starting to feel normal I feel a lot better. No SE.  Speech normal off risperidone. Sleeping OK on trazaodone and enough.   Noticed benefit from Adderall again. Plan: continue fluoxetine to 20 mg daily Continue Abilify 1/2 of 15 mg tablet daily for depression and mania continue Adderall to XR 20 mg AM  10/10/2021 phone call: Pt stated she feels like the Abilify should be decreased to 23m.She said she is depressed but rational and not suicidal.She has an appt Monday and can wait until then if you prefer. MD response: Reduce the Abilify to 7.5 mg every other day.  We will meet on 10/16/2021  and decide what to do from there.  10/16/2021 appointment with the following noted: More depressed.  Most depressed I've ever been.  Just numb.  Sense of grief.   Thinks the manic episode was unlike anything else she ever had.  Doesn't want to medicate against it.  Don't enjoy people.  Easily overwhelmed.  Had some death thoughts but not suicidal.  Has been functional.  Feels better today after reducing Abilify to every other day but she is only been doing that for 3 days. A/P: Episode of post manic depression was explained. continue fluoxetine to 20 mg daily Hold Abilify for 1 week then resume Abilify 1/2 of 15 mg tablet every other day for depression and mania continue Adderall to XR 20 mg AM  10/27/2021  appointment with the following noted: I'm doing so much better.  Handling the depression better. Better self talk and spiritual focus has helped.   Dep 6/10 manifesting as anxiety with low confidence.   F died 2  years ago and M 66 yo and is dependent . She is working hard to feel better but still feels depressed.  She almost feels like she has a little more anxiety since restarting Abilify every other day. Plan: continue fluoxetine to 20 mg daily DC Abilify .  Vrayalar 1.5 mg QOD to try to get rid of depression ASAP. continue Adderall to XR 20 mg AM  11/10/2021 appointment with the following noted: Busy with Xmas and it was fun with family but then a big let down.  Did well with it.  Functioned well with it.  Working hard on things with depression.  Not shutting down. Not sure but feels better today but yesterday was hard.  Difficulty dealing with mother.  She won't do anything to help herself.  Yesterday with her all day.  Won't do PT and has isolated herself.    Lack of confidence.   No SE with Vraylar.  11/24/21 urgent appointment appt noted: More and more depressed.   So anxious and doesn't want to be alone but can do so. No appetite. Hurts inside. Has had some fleeting suicidal  thoughts but would not act on them.  Tolerating meds. Has been consistent with Vraylar 1.5 mg every other day, fluoxetine 20 mg daily Plan: Increase Vraylar to 1.5 mg daily Change Prozac to Trintellix 10 mg daily. Discussed side effects of each continue Adderall to XR 20 mg AM  12/27/2021 appointment with the following noted: Not OK.  I feel less depressed but feels bat shit. Not sleeping well.  Extremely anxious. Off and on sleep. 3-4 hours of sleep.   Still having daily SI.  But also become obvious has so much to do.  Overwhelmed by tasks.   Needs anxiety meds to just function. Not more motivated.  Walked yesterday.   Feels afraid like in trouble but not irritable or angry. DC DT agitation Vraylar to 1.5 mg daily Change Prozac to Trintellix 10 mg daily. Hold Adderall to XR 20 mg AM Clonidine 0.1 1/2 tablet twice daily for 2 days and if needed for anxiety and sleep increase to 1 twice daily Ok temporary Ativan 1 mg 3 times daily as needed anxiety  01/05/22 appt noted: Off fluoxetine and  Trintellix.  Only on Ativan, trazodone and Adderall XR 20 plus added clonidine 0.1 mg BID Didn't think she needed to start Trintellix. Not taking Ativan.   Didn't like herself last week. Feels some better today. Wonders if the manic sx Not agitated.  Anxiety kind of calmed down.  A lot to be anxious about situationally.  $ stress. Concerns about downers with meds. Can't access normal personality. ? Lethargy and inability to talk as sE. Plan: Latuda 20-40 mg daily with food. Adderall to XR 20 mg AM Clonidine 0.1 1/2 tablet twice daily  reduce dose to be sure no SE Ok temporary Ativan 1 mg 3 times daily as needed anxiety  01/19/22 appt noted: Taking Latuda 20 mg daily.  Took 40 mg once and felt anxious and  SI Still depressed and not very reactive Anxiety mainly about the depression and fears of the future. She wants to revisit manic sx and thinks it was maybe bc taking delta 8 bc was taking a lot  of it so still  doesn't think she's classic bipolar. She wants to only take Prozac bc thinks Latuda is perpetuating depression. Says the delta 8 was very psychaedelic.  When not taking it was not manic.  Sleeping ok again.  Plan: Per her request DC Latuda 20-40 mg daily with food. She wants to continue Prozac alone AMA  Adderall to XR 20 mg AM Clonidine 0.1 1/2 tablet twice daily  reduce dose to be sure no SE Ok temporary Ativan 1 mg 3 times daily as needed anxiety  01/23/2022 phone call complaining of increased anxiety since stopping Latuda.  She will try increasing clonidine.  01/26/2022 phone call not feeling well and wanted to restart the Vraylar.  However notes indicate that had made her agitated therefore she was encouraged to pick up samples of Rexulti 1 mg and start that instead.  02/06/2022 phone call: Stating she felt the Rexulti was helping with depression but she was not sleeping well and obsessing over things.  She was encouraged to increase Rexulti to 2 mg daily and increase trazodone for sleep.  02/09/2022 appointment with the following noted: This was an urgent work in appointment No sleep last night with trazodone 100 mg HS Nothing really better depression or anxiety. Ruminating negative anxious thoughts. Did not tolerate Rexulti because it was causing insomnia.  Does not think it helped depression.  Lacks emotion that she should have.  Lacks her usual personality.  Some hopeless thoughts.  Some death thoughts.  Some suicidal thoughts without plan or intent Plan: DC Rexulti and Prozac & DC trazodone Adderall to XR 20 mg AM Clonidine 0.1 1/tablet twice daily  reduce dose to be sure no SE Ok temporary Ativan 1 mg 3 times daily as needed anxiety Start Seroquel XR 150 mg nightly  03/02/2022 appointment: Laura Chang called back a few days after starting Seroquel stating it was making her more anxious and more depressed.  This seemed unlikely as this medicine rarely ever causes anxiety.   She stopped the medication waited 3 days and called back still had anxiety and depression but thought perhaps the anxiety was a little better.  She did not want to take the Seroquel. She knew about the option of Spravato and wanted to pursue that. Now questions whether to return to Seroquel while waiting to start Spravato bc feels just as bad without it and knows she didn't give it enough time to work.   MADRS 46  ECT-MADRS    Flowsheet Row Office Visit from 03/02/2022 in [facility]  MADRS Total Score 46      03/14/22 appt noted: Pt received Spravato 56 mg first dose today with some dissociative sx which were not severe.  She was anxious prior to the administration and felt better after receiving lorazepam 1 mg.  No NV, or HA. Wants to continue Spravato. Ongoing depression and desperate to feel better.  I'm not myself DT deprsssion which is most severe in recent history.  Anhedonia.  Low motivation.  Social avoidance. Continues to think all recent med trials are making her worse.  Sleep ok with Seroquel.  03/16/22 appt noted: Received Spravato 84 mg for the first time.  some dissociative sx which were not severe.  She was anxious prior to the administration and felt better after receiving lorazepam 1 mg.  No NV, or HA. Wants to continue Spravato.   Does not feel any better or different since the last appt.  Ongoing depression.  Ongoing depression and desperate to feel better.  I'm not  myself DT deprsssion which is most severe in recent history.  Anhedonia.  Low motivation.  Social avoidance. Continues to think all recent med trials are making her worse.  Sleep ok with Seroquel.  Does not want to continue Seroquel for TRD.  03/20/2022 appointment noted: Came for Spravato administration today.  However blood pressure was significantly elevated approximately 180/115.  She was given lorazepam 1 mg and clonidine 0.2 mg to try to get it down. She states she regretted stopping the  Seroquel XR 300 mg tablets.  She now realizes it was helpful.  She did not sleep much at all last night.  She did not take the Adderall this morning. 2 to 3 hours after arrival blood pressure was still elevated at  170/110, 62 pulse.  For Spravato administration was canceled for today.  She admits to being anxious and depressed.  She is not suicidal.  She is highly motivated to receive the Spravato.  We discussed getting it tomorrow.  03/22/2022 appointment noted: Patient's blood pressure was never stable enough yesterday in order to get her in for Spravato administration.  She was encouraged to see her primary care doctor.  It is better today.  03/26/2022 appointment with the following noted: Blood pressure was better.  Saw her primary care doctor who started on oral Bystolic 2.5 mg daily. Received Spravato 84 mg today as scheduled.  Tolerated it well without nausea or vomiting headache or chest pain or palpitations.  Her blood pressure was borderline but manageable. She remains depressed and anxious.  She is ambivalent about the medicine and desperate to get to feel better.  Continues to have anhedonia and low energy and low motivation and reduced ability to do things.  Less social.  Not suicidal.  03/28/22 appt noted: Received Spravato 84 mg today as scheduled.  Tolerated it well without nausea or vomiting headache or chest pain or palpitations.  Her blood pressure was borderline but manageable. Has not seen any improvement so far.  Tolerating Seroquel.  Inconsistent with Bystolic and BP has been borderline high. Still depressed and anxious and anhedonia.  Low motivation, energy, productivity. Taking quetiapine and tolerating XR 300 mg nightly.  04/04/22 appt noted: Received Spravato 84 mg today as scheduled.  Tolerated it well without nausea or vomiting headache or chest pain or palpitations.  Her blood pressure was borderline but manageable. Has not seen any improvement so far.  Tolerating  Seroquel.   She still tends to think that the medications are making her worse.  She has said this about each of the recent psychiatric medicines including Seroquel.  However her husband thinks she is improved.  She also admits there is some improvement in productivity.  She still feels highly anxious.  She still does not enjoy things as normal.  She still feels desperate to improve as soon as possible. Has been taking Seroquel XR since 03/20/2022  04/10/22 appt noted: Received Spravato 84 mg today as scheduled.  Tolerated it well without nausea or vomiting headache or chest pain or palpitations.  Her blood pressure was borderline but manageable. Has not seen any improvement so far.  Tolerating Seroquel.  Doesn't like Seroquel bc she thinks it flattens here. 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  Tolerated it well without nausea or vomiting headache or chest pain or palpitations.  Her blood pressure was borderline but manageable. Has  not seen any improvement so far.  Tolerating Seroquel.  Doesn't like Seroquel bc she thinks it flattens her. Received Spravato 84 mg today as scheduled.  Tolerated it well without nausea or vomiting headache or chest pain or palpitations.  Her blood pressure was borderline but manageable. Has not seen any improvement so far.  Tolerating Seroquel.  Doesn't like Seroquel bc she thinks it flattens here.  We discussed her ambivalence about it. She is starting Auvelity and has tolerated it the last 2 days without side effect.  She still does not feel like herself and feels flat and not enjoying things with suppressed expressed emotion  04/17/2022 appointment with the following noted: Received Spravato 84 mg today as scheduled.  Tolerated it well without nausea or vomiting headache or chest pain or  palpitations.  Her blood pressure was borderline but manageable. Has not seen any improvement so far.  Tolerating Seroquel.  Doesn't like Seroquel bc she thinks it flattens her. She has been tolerating the Auvelity 1 in the morning without side effects for about a week.  She has not noticed significant improvement so far.  She still feels depressed and flat and not herself.  Other people notice that she is flat emotionally.  She is not suicidal.  She does feel discouraged that she is not getting better yet.  04/19/2022 appointment noted: Has increased Auvelity to 1 twice daily for 2 days, continues quetiapine XR 300 mg nightly, clonidine 0.3 mg twice daily, lorazepam 1 mg twice daily for anxiety and Adderall XR 20 mg in the morning. No obious SE but she still thinks quetiapine XR is making her feel down.  But not sedated Received Spravato 84 mg today as scheduled.  Tolerated it well without nausea or vomiting headache or chest pain or palpitations.  Her blood pressure was borderline but manageable. She still feels quite anxious and feels it necessary to take both the clonidine and lorazepam twice a day to manage her anxiety.  She has been consistently down and flat and not herself until yesterday afternoon she noted an improvement in mood and feeling much more like herself with her normal personality reemerging.  She was quite depressed in the morning with very dark negative thoughts.  She did not have those dark negative thoughts this morning.  She had a lot of questions about medication and when she was expecting to be improved and why she has not shown improvement up to now.  04/23/22 appt noted: Has increased Auvelity to 1 twice daily for 1 week, continues quetiapine XR 300 mg nightly, clonidine 0.3 mg twice daily, lorazepam 1 mg twice daily for anxiety and Adderall XR 20 mg in the morning. No obious SE but she still thinks quetiapine XR is making her feel down.  But not sedated Received Spravato 84  mg today as scheduled.  Tolerated it well without nausea or vomiting headache or chest pain or palpitations.  She is still depressed but admits better function and is able to enjoy social interactions. Tolerating meds.  Would like to feel better for sure. Not herself.  Flat. Plan increase Auvelity to 1 tab BID as planned and reduce Quetiapine to 1/2 of ER 300 mg  bc NR for depression.  04/25/2022 appointment with the following noted: clonidine 0.3 mg twice daily, lorazepam 1 mg twice daily for anxiety and Adderall XR 20 mg in the morning. No obious SE but she still thinks quetiapine XR is making her feel down.  But not sedated Received Spravato  84 mg today as scheduled.  Tolerated it well without nausea or vomiting headache or chest pain or palpitations.  Called yesterday with more anxiety.  Had increased Auvelity for 1 day and reduced Seroquel XR for 1 day.  Felt restless and fearful  Past Psychiatric Medication Trials: fluoxetine, duloxetine, Viibryd, lamotrigine, Pristiq, sertraline, citalopram,  Trintellix anxious and SI Wellbutrin  Adderall, Adderall XR, Vyvanse, Ritalin, Strattera low dose NR Lorazepam Trazodone  Depakote,  lamotrigine cog complaints Lithium remotely Abilify 7.5  Vraylar 1.5 mg daily agitation and insomnia Rexulti insomnia Latuda 40 one dose, CO anxious and SI Seroquel XR 300  At visit November 12, 2019. We discussed Patient developed an increasingly severe alcohol dependence problem since her last visit in January.  She went to [facility] and has had no alcohol since then except 1 day.  She never abused stimulants but they took her off the stimulants at [facility].  Her ADD was markedly worse.  The Wellbutrin did not help the ADD.   D history lamotrigine rash at 65 yo  Review of Systems:  Review of Systems  Constitutional:  Positive for fatigue.  Cardiovascular:  Negative for chest pain and palpitations.  Musculoskeletal:  Positive for arthralgias,  back pain and joint swelling.       SP hip surgery October 2020  Neurological:  Negative for tremors.  Psychiatric/Behavioral:  Positive for decreased concentration, dysphoric mood and sleep disturbance. Negative for behavioral problems, confusion, hallucinations, self-injury and suicidal ideas. The patient is nervous/anxious. The patient is not hyperactive.     Medications: I have reviewed the patient's current medications.  Current Outpatient Medications  Medication Sig Dispense Refill   [START ON 05/15/2022] amphetamine-dextroamphetamine (ADDERALL XR) 20 MG 24 hr capsule Take 1 capsule (20 mg total) by mouth every morning. 30 capsule 0   amphetamine-dextroamphetamine (ADDERALL XR) 20 MG 24 hr capsule Take 1 capsule (20 mg total) by mouth every morning. 30 capsule 0   cloNIDine (CATAPRES) 0.1 MG tablet TAKE 1 TABLET BY MOUTH TWICE A DAY 60 tablet 0   cloNIDine (CATAPRES) 0.2 MG tablet TAKE 1 TABLET BY MOUTH 2 TIMES DAILY. 60 tablet 0   Dextromethorphan-buPROPion ER (AUVELITY) 45-105 MG TBCR Take 1 tablet by mouth 2 (two) times daily. 60 tablet 1   Esketamine HCl, 84 MG Dose, (SPRAVATO, 84 MG DOSE,) 28 MG/DEVICE SOPK USE 3 SPRAYS IN EACH NOSTRIL TWICE A WEEK 3 each 5   iron polysaccharides (NIFEREX) 150 MG capsule TAKE 1 CAPSULE BY MOUTH EVERY DAY 30 capsule 1   LORazepam (ATIVAN) 1 MG tablet Take 1 tablet (1 mg total) by mouth every 6 (six) hours as needed for anxiety. 100 tablet 0   nebivolol (BYSTOLIC) 2.5 MG tablet Take 2.5 mg by mouth daily.     QUEtiapine (SEROQUEL XR) 300 MG 24 hr tablet Take 1 tablet (300 mg total) by mouth at bedtime. 30 tablet 0   No current facility-administered medications for this visit.    Medication Side Effects: None  Allergies:  Allergies  Allergen Reactions   Metronidazole Shortness Of Breath and Other (See Comments)    Heart pounding   Ferrlecit [Na Ferric Gluc Cplx In Sucrose] Other (See Comments)    Infusion reaction 05/12/2019    Past Medical  History:  Diagnosis Date   ADHD    Anemia    Anxiety    Arthritis    Depression    Heart murmur    i went to see a cardiologit slast eyar  and i had zero plaque,    PONV (postoperative nausea and vomiting)    Recovering alcoholic in remission ([place])     Family History  Problem Relation Age of Onset   Atrial fibrillation Mother    CAD Father     Social History   Socioeconomic History   Marital status: Married    Spouse name: Not on file   Number of children: Not on file   Years of education: Not on file   Highest education level: Not on file  Occupational History   Not on file  Tobacco Use   Smoking status: Former    Types: Cigarettes    Quit date: 08/16/2003    Years since quitting: 18.7   Smokeless tobacco: Never   Tobacco comments:     08-28-2019 "i smoked 2 cigarettes in the last month since my father  passed"  Vaping Use   Vaping Use: Never used  Substance and Sexual Activity   Alcohol use: Yes    Alcohol/week: 10.0 standard drinks of alcohol    Types: 10 Glasses of wine per week   Drug use: No   Sexual activity: Not on file  Other Topics Concern   Not on file  Social History Narrative   Not on file   Social Determinants of Health   Financial Resource Strain: Not on file  Food Insecurity: Not on file  Transportation Needs: Not on file  Physical Activity: Not on file  Stress: Not on file  Social Connections: Not on file  Intimate Partner Violence: Not on file    Past Medical History, Surgical history, Social history, and Family history were reviewed and updated as appropriate.   Please see review of systems for further details on the patient's review from today.   Objective:   Physical Exam:  There were no vitals taken for this visit.  Physical Exam Constitutional:      General: She is not in acute distress. Neurological:     Mental Status: She is alert and oriented to person, place, and time.     Coordination: Coordination normal.      Gait: Gait normal.  Psychiatric:        Attention and Perception: Perception normal. She is attentive.        Mood and Affect: Mood is anxious and depressed. Affect is blunt. Affect is not labile, angry, tearful or inappropriate.        Speech: Speech is not rapid and pressured or slurred.        Behavior: Behavior is not slowed or aggressive.        Thought Content: Thought content is not paranoid or delusional. Thought content does not include homicidal or suicidal ideation. Thought content does not include suicidal plan.        Cognition and Memory: Cognition normal. She does not exhibit impaired recent memory.        Judgment: Judgment normal.     Comments: Insight intact. No auditory or visual hallucinations. No delusions.  Depression  ongoing. Affect with some smiling but still depressed and more blunted than usual. No Sui intent plan      Lab Review:     Component Value Date/Time   NA 137 01/12/2021 1430   NA 140 11/18/2018 1544   K 3.8 01/12/2021 1430   CL 108 01/12/2021 1430   CO2 22 01/12/2021 1430   GLUCOSE 94 01/12/2021 1430   BUN 14 01/12/2021 1430   BUN 20 11/18/2018 1544  CREATININE 0.82 01/12/2021 1430   CALCIUM 8.9 01/12/2021 1430   PROT 6.6 01/12/2021 1430   ALBUMIN 3.9 01/12/2021 1430   AST 12 (L) 01/12/2021 1430   ALT 11 01/12/2021 1430   ALKPHOS 46 01/12/2021 1430   BILITOT 0.5 01/12/2021 1430   GFRNONAA >60 01/12/2021 1430   GFRAA >60 09/02/2019 0249   GFRAA >60 01/27/2019 0811       Component Value Date/Time   WBC 4.5 01/12/2021 1430   RBC 4.32 01/12/2021 1430   HGB 12.8 01/12/2021 1430   HGB 12.9 07/17/2019 0953   HCT 38.5 01/12/2021 1430   HCT 21.9 (L) 12/25/2018 1221   PLT 272 01/12/2021 1430   PLT 286 07/17/2019 0953   MCV 89.1 01/12/2021 1430   MCH 29.6 01/12/2021 1430   MCHC 33.2 01/12/2021 1430   RDW 12.4 01/12/2021 1430   LYMPHSABS 1.4 01/12/2021 1430   MONOABS 0.4 01/12/2021 1430   EOSABS 0.0 01/12/2021 1430   BASOSABS 0.0  01/12/2021 1430    No results found for: "POCLITH", "LITHIUM"   No results found for: "PHENYTOIN", "PHENOBARB", "VALPROATE", "CBMZ"   .res Assessment: Plan:    Recurrent major depression resistant to treatment ([facility])  Generalized anxiety disorder - Plan: LORazepam (ATIVAN) 1 MG tablet  Insomnia due to mental condition - Plan: LORazepam (ATIVAN) 1 MG tablet  Attention deficit hyperactivity disorder (ADHD), predominantly inattentive type  Accelerated hypertension   She has treatment resistant major depression at this time.  We discussed some of her recent abnormal behaviors leading to this depressive episode getting worse which she says were associated with heavy use of delta 8 and not a manic episode.  She realizes now that that was not good for her.  She stopped all use of other drugs including those available over-the-counter such as delta 8 or any other THC related products.  She is no longer having any of those types of behaviors and instead is depressed. She remains persistently depressed with lack of interest and lack of feeling for things that normally she would have feelings about.  She has low motivation and energy.  She is sad and down.  She is less productive than usual.  Her concentration is poor.  She has high degree of anxiety as well. She has a very negative self-image and low self-esteem.  These are all uncharacteristic for her  Failed multiple antidepressants.  Many of them were not actual failures but intolerances and it is unclear whether some of that was more connected with anxiety than true side effects.  1 example is the [place].  In general she does not want to try anything but an antidepressant but has failed all major categories of antidepressants except TCAs and MAO inhibitors which have not been tried.  Could consider a TCA but given her manic response to delta 8 there is some concern about the risk of a TCA triggering mania.  It could be combined with lithium but  she generally is unfavorable about taking lithium.  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  She wants to continue Spravato.  We discussed discussed the side effects in detail as well as the protocol required to receive Spravato.  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  When she stopped it she could not sleep.  However it is not effective for depression. Has been taking Seroquel XR since 03/20/2022.   Because anxiety is worse increase Seroquel XR back to 1 tablet $Remove'300mg'IbMMKSp$  size at night  Resume Auvelity to 1 twice daily for the last week. .  To try to optimize dose  Started Spravato 84 mg twice weekly on 03/16/2022.  Patient was administered Spravato 84 mg intranasally today.  The patient experienced the typical dissociation which gradually resolved over the 2-hour period of observation.  There were no complications.  Specifically the patient did not have nausea or vomiting or headache.  Blood pressures remained within normal ranges at the 40-minute and 2-hour follow-up intervals.  By the time the 2-hour observation period was met the patient was alert and oriented and able to exit without assistance.  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  See nursing note for further details. BP ok today  Unfortunately she remains depressed.  Discussed alternatives.  Adderall to XR 20 mg AM Clonidine increased to 0.2 mg twice daily off label for anxiety  Ok temporary Ativan 1 mg 4 times daily as needed anxiety but try to cut it back. Is not ideal to use benzodiazepine with stimulant but because of the severity of her symptoms it has been  necessary.  Hope to eventually eliminate the benzodiazepine.  Expected as her depression improves her anxiety will improve as well.  However lately her anxiety has been unmanageable.  We will expect that to improve as the depression improves  She needs to continue to have a conversation with primary care doctor about her blood pressure medicines.  She was prescribed Bystolic but is taking it intermittently.  Take Bystolic consistently per PCP.  B blood pressure was better today  Discussed potential benefits, risks, and side effects of stimulants with patient to include increased heart rate, palpitations, insomnia, increased anxiety, increased irritability, or decreased appetite.  Instructed patient to contact office if experiencing any significant tolerability issues. She wants to return to usual dose of Adderall for ADD bc of mor poor cognitive function with reduction.  Also discussed that depression will impair cognitive function.  Discussed safety plan at length with patient.  Advised patient to contact office with any worsening signs and symptoms.  Instructed patient to go to the [facility] emergency room for evaluation if experiencing any acute safety concerns, to include suicidal intent.  Has Maintained sobriety  FU with Spravato twice weekly  Lynder Parents, MD, DFAPA     Please see After Visit Summary for patient specific instructions.  No future appointments.         No orders of the defined types were placed in this encounter.      -------------------------------

## 2022-04-25 NOTE — Telephone Encounter (Signed)
Noted  

## 2022-04-30 ENCOUNTER — Encounter: Payer: 59 | Admitting: Psychiatry

## 2022-04-30 ENCOUNTER — Ambulatory Visit: Payer: 59

## 2022-04-30 VITALS — BP 182/104 | HR 44

## 2022-04-30 NOTE — Progress Notes (Signed)
NURSE NOTE:  Pt arrived for her Spravato 15 mg,which is maintained at twice a week currently. Pt is being treated for Treatment Resistant Depression. Pt taken to treatment room, vital signs assessed in right arm, 185/109, pulse 49 at 1:20 PM. Informed pt obviously high and I would let her get relaxed, give her some water, and put on relaxing music. Rechecked about 10 minutes later and still elevated at 182/107, pulse 48 in right arm sitting. Pt reports taking her medication prior to coming to office today and has been taking her hypertension medication. Dr. Jennelle Human notified and agreed to giving Clonidine. Gave clonidine 0.1 mg at 1:34 PM. Will reassess in about an hour. Pt relaxing in room listening to music and reclined back. Rechecked her vital signs at 2:25 PM, 182/104, pulse 44. Informed pt that she would have to reschedule for tomorrow at 9:30 AM. Pt agreed and Dr. Jennelle Human notified and will discuss options with her tomorrow.

## 2022-05-01 ENCOUNTER — Ambulatory Visit (INDEPENDENT_AMBULATORY_CARE_PROVIDER_SITE_OTHER): Payer: 59 | Admitting: Psychiatry

## 2022-05-01 ENCOUNTER — Other Ambulatory Visit: Payer: Self-pay | Admitting: Psychiatry

## 2022-05-01 ENCOUNTER — Encounter: Payer: Self-pay | Admitting: Psychiatry

## 2022-05-01 ENCOUNTER — Ambulatory Visit: Payer: 59

## 2022-05-01 VITALS — BP 129/77 | HR 45

## 2022-05-01 DIAGNOSIS — F339 Major depressive disorder, recurrent, unspecified: Secondary | ICD-10-CM

## 2022-05-01 DIAGNOSIS — F900 Attention-deficit hyperactivity disorder, predominantly inattentive type: Secondary | ICD-10-CM | POA: Diagnosis not present

## 2022-05-01 DIAGNOSIS — F411 Generalized anxiety disorder: Secondary | ICD-10-CM | POA: Diagnosis not present

## 2022-05-01 DIAGNOSIS — F5105 Insomnia due to other mental disorder: Secondary | ICD-10-CM

## 2022-05-01 DIAGNOSIS — I10 Essential (primary) hypertension: Secondary | ICD-10-CM | POA: Diagnosis not present

## 2022-05-01 MED ORDER — CLONIDINE HCL 0.3 MG PO TABS: 0.3000 mg | ORAL_TABLET | Freq: Two times a day (BID) | ORAL | 0 refills | Status: DC

## 2022-05-01 MED ORDER — POLYSACCHARIDE IRON COMPLEX 150 MG PO CAPS: ORAL_CAPSULE | ORAL | 1 refills | Status: AC

## 2022-05-01 MED ORDER — CLONIDINE HCL 0.2 MG PO TABS: 0.2000 mg | ORAL_TABLET | Freq: Two times a day (BID) | ORAL | 0 refills | Status: DC

## 2022-05-01 NOTE — Progress Notes (Addendum)
[name] [id number] 07-12-1956 66 y.o.  Subjective:   Patient ID:  Laura Chang is a 66 y.o. (DOB [birth year]) female.  Chief Complaint:  Chief Complaint  Patient presents with   Follow-up   Depression   Anxiety   ADD   Memory Loss     HPI Laura Chang presents to the office today for follow-up of depression and anxiety and ADD.  seen November 12, 2019.  Melted down in 2020.  Went to [employer] in July.  No withdrawal.  1 drink since then.  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 She was on no stimulant and no SSRI but was taking Strattera and Wellbutrin.  The following changes were made. Stop Strattera. OK restart stimulant bc severe ADD Restart Adderall 1 daily for a few days and if tolerated then restart 1 twice daily. If not tolerated reduce the dosage if needed. May need to stop Wellbutrin if not tolerating the stimulant.  Yes.  DC Wellbutrin Restart Prozac 20 mg daily.  February 2021 appointment with the following noted: Completed grant proposal.  Couldn't doit without Adderall.  Sold a bunch of work.   Adderall XR lasts about 3 pm.  Strength seems about right.  BP been OK.  Not jittery.   Stopped Wellbutrin but had no SE. Mood drastically better with grant proposal and back on fluoxetine.  Less depressed and lethargic.  No anxiety.  Cut back on coffee. Started back with devotions and stronger faith. Plan: Continue Prozac 20 mg daily. May have to increase the dose at some point in the future given that she usually was taking higher dosages but she is getting good response at this time. Restart Wellbutrin off label for ADD since can't get 2 ADDERALL daily. 150 mg daily then 300 mg daily. She can adjust the dose between 150 mg and 300 mg daily to get the optimal effect.   05/11/2020 appointment with the following noted: Has been inconsistent with Prozac and Wellbutrin. Not sure of the effect of Wellbutrin. Biggest deterrent in  work is anxiety.  Some of the work is conceptual and difficult at times.  Can feel she's not up to a project at times.  Overall is OK but would like a steadier benefit from stimulant.  Exhausted from managing concentration and keeping up with things from the day.  Loses things.  Not good keeping up with schedule. Overall productive and emotionally OK. Can feel Adderall wear off. Mood is better in summer and worse in the winter.   F died in 10-10-2023 and that is a loss. No SE Wellbutrin. Still attends AA meetings.  Real benefit from [place] last year. Recognizes effect of anemia on ADD and mood.  Had iron infusions last winter. Plan:  Wellbutrin off label for ADD since can't get 2 ADDERALL daily. 150 mg daily then 300 mg daily.  01/24/2021 appointment with following noted: Doing a program called [employer] mindfulness app since Xmas.  CBT app helped the depression.  App helped her focus better.  Lost sign weight. Writing a lot. Before Xmas felt depressed and started negative thinking worse, self denigrating. Not drinking. More isolated.   Recognizes mo is narcissist.    Didn't tell anyone she was born until 3 mos later.  M aloof and uninterested in pt.  Lied about her birthday.  Mo lack of affection even with pt's kids. Going to [place] for a year and it helped her to quit drinking. Also  misses kids being gone with a hole also. Plan: No med changes  05/04/2021 appointment with the following noted: Therapist [name] thinks she's manic. Lost weight to 144#.   States she is still sleeping okay.  Admits she is hyper and recognizes that she is likely manic.  She feels great, euphoric with an increased sense of spiritual connectedness to God.  She has racing thoughts and talks fast and talks a lot and this is noted by her husband.  He thinks she is a bit hyper.  She has been able to maintain sobriety although she will have 1 glass of wine on special occasions but does not drink by herself.  She  is not drinking to excess.  She denies any dangerous impulsivity.  She is clearly not depressed and not particularly anxious.  She has no concerns about her medication and she has been compliant.  06/16/21 appt noted: So much better.  Going through a lot but the manic thing happened on top of it.  So much slower.  Didn't feel like losing anything with risperidone.  Likes the Adderalll at 10 mg. Some drowsiness in the AM and very drowsy from risperidone 2 mg HS. Prayer life is better. Handling stress better. Less depressed with risperidone. Still likes trazodone. Sleeps well. Plan: Reduce Prozac to 10 mg daily.  Consider stopping it because it can feel the mania however she is reluctant to do that because she fears relapse of depression. Reduce risperidone to 1.5 mg nightly due to side effects.  Discussed risk of worsening mania.  07/25/2021 appointment with the following noted: Misses the Adderall and hard to function without it. Depressed now. Heavy chest.  Anxious and guilty.  Body feels heavy.   Hates Wellbutrin.   Plan: Increase fluoxetine to 20 mg daily Add Abilify 1/2 of 15 mg tablet daily Wean wellbutrin by 1 tablet each week  bc she feels it is not helpful and DT polypharmacy Reduce risperidone to 1 daily for 1 week and stop it. Disc risk of mania. Increase Adderall to XR 20 mg AM  08/08/21 Much less depressed and starting to feel normal I feel a lot better. No SE.  Speech normal off risperidone. Sleeping OK on trazaodone and enough.   Noticed benefit from Adderall again. Plan: continue fluoxetine to 20 mg daily Continue Abilify 1/2 of 15 mg tablet daily for depression and mania continue Adderall to XR 20 mg AM  10/10/2021 phone call: Pt stated she feels like the Abilify should be decreased to 6 5m.She said she is depressed but rational and not suicidal.She has an appt Monday and can wait until then if you prefer. MD response: Reduce the Abilify to 7.5 mg every other day.  We will  meet on 10/16/2021 and decide what to do from there.  10/16/2021 appointment with the following noted: More depressed.  Most depressed I've ever been.  Just numb.  Sense of grief.   Thinks the manic episode was unlike anything else she ever had.  Doesn't want to medicate against it.  Don't enjoy people.  Easily overwhelmed.  Had some death thoughts but not suicidal.  Has been functional.  Feels better today after reducing Abilify to every other day but she is only been doing that for 3 days. A/P: Episode of post manic depression was explained. continue fluoxetine to 20 mg daily Hold Abilify for 1 week then resume Abilify 1/2 of 15 mg tablet every other day for depression and mania continue Adderall to XR 20  mg AM  10/27/2021 appointment with the following noted: I'm doing so much better.  Handling the depression better. Better self talk and spiritual focus has helped.   Dep 6/10 manifesting as anxiety with low confidence.   F died 2  years ago and M 66 yo and is dependent . She is working hard to feel better but still feels depressed.  She almost feels like she has a little more anxiety since restarting Abilify every other day. Plan: continue fluoxetine to 20 mg daily DC Abilify .  Vrayalar 1.5 mg QOD to try to get rid of depression ASAP. continue Adderall to XR 20 mg AM  11/10/2021 appointment with the following noted: Busy with Xmas and it was fun with family but then a big let down.  Did well with it.  Functioned well with it.  Working hard on things with depression.  Not shutting down. Not sure but feels better today but yesterday was hard.  Difficulty dealing with mother.  She won't do anything to help herself.  Yesterday with her all day.  Won't do PT and has isolated herself.    Lack of confidence.   No SE with Vraylar.  11/24/21 urgent appointment appt noted: More and more depressed.   So anxious and doesn't want to be alone but can do so. No appetite. Hurts inside. Has had some  fleeting suicidal thoughts but would not act on them.  Tolerating meds. Has been consistent with Vraylar 1.5 mg every other day, fluoxetine 20 mg daily Plan: Increase Vraylar to 1.5 mg daily Change Prozac to Trintellix 10 mg daily. Discussed side effects of each continue Adderall to XR 20 mg AM  12/27/2021 appointment with the following noted: Not OK.  I feel less depressed but feels bat shit. Not sleeping well.  Extremely anxious. Off and on sleep. 3-4 hours of sleep.   Still having daily SI.  But also become obvious has so much to do.  Overwhelmed by tasks.   Needs anxiety meds to just function. Not more motivated.  Walked yesterday.   Feels afraid like in trouble but not irritable or angry. DC DT agitation Vraylar to 1.5 mg daily Change Prozac to Trintellix 10 mg daily. Hold Adderall to XR 20 mg AM Clonidine 0.1 1/2 tablet twice daily for 2 days and if needed for anxiety and sleep increase to 1 twice daily Ok temporary Ativan 1 mg 3 times daily as needed anxiety  01/05/22 appt noted: Off fluoxetine and  Trintellix.  Only on Ativan, trazodone and Adderall XR 20 plus added clonidine 0.1 mg BID Didn't think she needed to start Trintellix. Not taking Ativan.   Didn't like herself last week. Feels some better today. Wonders if the manic sx Not agitated.  Anxiety kind of calmed down.  A lot to be anxious about situationally.  $ stress. Concerns about downers with meds. Can't access normal personality. ? Lethargy and inability to talk as sE. Plan: Latuda 20-40 mg daily with food. Adderall to XR 20 mg AM Clonidine 0.1 1/2 tablet twice daily  reduce dose to be sure no SE Ok temporary Ativan 1 mg 3 times daily as needed anxiety  01/19/22 appt noted: Taking Latuda 20 mg daily.  Took 40 mg once and felt anxious and  SI Still depressed and not very reactive Anxiety mainly about the depression and fears of the future. She wants to revisit manic sx and thinks it was maybe bc taking delta 8  bc was taking a lot  of it so still doesn't think she's classic bipolar. She wants to only take Prozac bc thinks Latuda is perpetuating depression. Says the delta 8 was very psychaedelic.  When not taking it was not manic.  Sleeping ok again.  Plan: Per her request DC Latuda 20-40 mg daily with food. She wants to continue Prozac alone AMA  Adderall to XR 20 mg AM Clonidine 0.1 1/2 tablet twice daily  reduce dose to be sure no SE Ok temporary Ativan 1 mg 3 times daily as needed anxiety  01/23/2022 phone call complaining of increased anxiety since stopping Latuda.  She will try increasing clonidine.  01/26/2022 phone call not feeling well and wanted to restart the Vraylar.  However notes indicate that had made her agitated therefore she was encouraged to pick up samples of Rexulti 1 mg and start that instead.  02/06/2022 phone call: Stating she felt the Rexulti was helping with depression but she was not sleeping well and obsessing over things.  She was encouraged to increase Rexulti to 2 mg daily and increase trazodone for sleep.  02/09/2022 appointment with the following noted: This was an urgent work in appointment No sleep last night with trazodone 100 mg HS Nothing really better depression or anxiety. Ruminating negative anxious thoughts. Did not tolerate Rexulti because it was causing insomnia.  Does not think it helped depression.  Lacks emotion that she should have.  Lacks her usual personality.  Some hopeless thoughts.  Some death thoughts.  Some suicidal thoughts without plan or intent Plan: DC Rexulti and Prozac & DC trazodone Adderall to XR 20 mg AM Clonidine 0.1 1/tablet twice daily  reduce dose to be sure no SE Ok temporary Ativan 1 mg 3 times daily as needed anxiety Start Seroquel XR 150 mg nightly  03/02/2022 appointment: Langley Gauss called back a few days after starting Seroquel stating it was making her more anxious and more depressed.  This seemed unlikely as this medicine rarely  ever causes anxiety.  She stopped the medication waited 3 days and called back still had anxiety and depression but thought perhaps the anxiety was a little better.  She did not want to take the Seroquel. She knew about the option of Spravato and wanted to pursue that. Now questions whether to return to Seroquel while waiting to start Spravato bc feels just as bad without it and knows she didn't give it enough time to work.   MADRS 46  ECT-MADRS    Flowsheet Row Office Visit from 03/02/2022 in [facility]  MADRS Total Score 46      03/14/22 appt noted: Pt received Spravato 56 mg first dose today with some dissociative sx which were not severe.  She was anxious prior to the administration and felt better after receiving lorazepam 1 mg.  No NV, or HA. Wants to continue Spravato. Ongoing depression and desperate to feel better.  I'm not myself DT deprsssion which is most severe in recent history.  Anhedonia.  Low motivation.  Social avoidance. Continues to think all recent med trials are making her worse.  Sleep ok with Seroquel.  03/16/22 appt noted: Received Spravato 84 mg for the first time.  some dissociative sx which were not severe.  She was anxious prior to the administration and felt better after receiving lorazepam 1 mg.  No NV, or HA. Wants to continue Spravato.   Does not feel any better or different since the last appt.  Ongoing depression.  Ongoing depression and desperate to feel  better.  I'm not myself DT deprsssion which is most severe in recent history.  Anhedonia.  Low motivation.  Social avoidance. Continues to think all recent med trials are making her worse.  Sleep ok with Seroquel.  Does not want to continue Seroquel for TRD.  03/20/2022 appointment noted: Came for Spravato administration today.  However blood pressure was significantly elevated approximately 180/115.  She was given lorazepam 1 mg and clonidine 0.2 mg to try to get it down. She states she  regretted stopping the Seroquel XR 300 mg tablets.  She now realizes it was helpful.  She did not sleep much at all last night.  She did not take the Adderall this morning. 2 to 3 hours after arrival blood pressure was still elevated at  170/110, 62 pulse.  For Spravato administration was canceled for today.  She admits to being anxious and depressed.  She is not suicidal.  She is highly motivated to receive the Spravato.  We discussed getting it tomorrow.  03/22/2022 appointment noted: Patient's blood pressure was never stable enough yesterday in order to get her in for Spravato administration.  She was encouraged to see her primary care doctor.  It is better today.  03/26/2022 appointment with the following noted: Blood pressure was better.  Saw her primary care doctor who started on oral Bystolic 2.5 mg daily. Received Spravato 84 mg today as scheduled.  Tolerated it well without nausea or vomiting headache or chest pain or palpitations.  Her blood pressure was borderline but manageable. She remains depressed and anxious.  She is ambivalent about the medicine and desperate to get to feel better.  Continues to have anhedonia and low energy and low motivation and reduced ability to do things.  Less social.  Not suicidal.  03/28/22 appt noted: Received Spravato 84 mg today as scheduled.  Tolerated it well without nausea or vomiting headache or chest pain or palpitations.  Her blood pressure was borderline but manageable. Has not seen any improvement so far.  Tolerating Seroquel.  Inconsistent with Bystolic and BP has been borderline high. Still depressed and anxious and anhedonia.  Low motivation, energy, productivity. Taking quetiapine and tolerating XR 300 mg nightly.  04/04/22 appt noted: Received Spravato 84 mg today as scheduled.  Tolerated it well without nausea or vomiting headache or chest pain or palpitations.  Her blood pressure was borderline but manageable. Has not seen any improvement so  far.  Tolerating Seroquel.   She still tends to think that the medications are making her worse.  She has said this about each of the recent psychiatric medicines including Seroquel.  However her husband thinks she is improved.  She also admits there is some improvement in productivity.  She still feels highly anxious.  She still does not enjoy things as normal.  She still feels desperate to improve as soon as possible. Has been taking Seroquel XR since 03/20/2022  04/10/22 appt noted: Received Spravato 84 mg today as scheduled.  Tolerated it well without nausea or vomiting headache or chest pain or palpitations.  Her blood pressure was borderline but manageable. Has not seen any improvement so far.  Tolerating Seroquel.  Doesn't like Seroquel bc she thinks it flattens here. 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  Tolerated it well without nausea or vomiting headache or chest pain or palpitations.  Her blood pressure was  borderline but manageable. Has not seen any improvement so far.  Tolerating Seroquel.  Doesn't like Seroquel bc she thinks it flattens her. Received Spravato 84 mg today as scheduled.  Tolerated it well without nausea or vomiting headache or chest pain or palpitations.  Her blood pressure was borderline but manageable. Has not seen any improvement so far.  Tolerating Seroquel.  Doesn't like Seroquel bc she thinks it flattens here.  We discussed her ambivalence about it. She is starting Auvelity and has tolerated it the last 2 days without side effect.  She still does not feel like herself and feels flat and not enjoying things with suppressed expressed emotion  04/17/2022 appointment with the following noted: Received Spravato 84 mg today as scheduled.  Tolerated it well without nausea or vomiting headache or  chest pain or palpitations.  Her blood pressure was borderline but manageable. Has not seen any improvement so far.  Tolerating Seroquel.  Doesn't like Seroquel bc she thinks it flattens her. She has been tolerating the Auvelity 1 in the morning without side effects for about a week.  She has not noticed significant improvement so far.  She still feels depressed and flat and not herself.  Other people notice that she is flat emotionally.  She is not suicidal.  She does feel discouraged that she is not getting better yet.  04/19/2022 appointment noted: Has increased Auvelity to 1 twice daily for 2 days, continues quetiapine XR 300 mg nightly, clonidine 0.3 mg twice daily, lorazepam 1 mg twice daily for anxiety and Adderall XR 20 mg in the morning. No obious SE but she still thinks quetiapine XR is making her feel down.  But not sedated Received Spravato 84 mg today as scheduled.  Tolerated it well without nausea or vomiting headache or chest pain or palpitations.  Her blood pressure was borderline but manageable. She still feels quite anxious and feels it necessary to take both the clonidine and lorazepam twice a day to manage her anxiety.  She has been consistently down and flat and not herself until yesterday afternoon she noted an improvement in mood and feeling much more like herself with her normal personality reemerging.  She was quite depressed in the morning with very dark negative thoughts.  She did not have those dark negative thoughts this morning.  She had a lot of questions about medication and when she was expecting to be improved and why she has not shown improvement up to now.  04/23/22 appt noted: Has increased Auvelity to 1 twice daily for 1 week, continues quetiapine XR 300 mg nightly, clonidine 0.3 mg twice daily, lorazepam 1 mg twice daily for anxiety and Adderall XR 20 mg in the morning. No obious SE but she still thinks quetiapine XR is making her feel down.  But not sedated Received  Spravato 84 mg today as scheduled.  Tolerated it well without nausea or vomiting headache or chest pain or palpitations.  She is still depressed but admits better function and is able to enjoy social interactions. Tolerating meds.  Would like to feel better for sure. Not herself.  Flat. Plan increase Auvelity to 1 tab BID as planned and reduce Quetiapine to 1/2 of ER 300 mg  bc NR for depression.  04/25/2022 appointment with the following noted: clonidine 0.3 mg twice daily, lorazepam 1 mg twice daily for anxiety and Adderall XR 20 mg in the morning. Seroquel XR 300 HS No obious SE but she still thinks quetiapine XR is making her  feel down.  But not sedated Received Spravato 84 mg today as scheduled.  Tolerated it well without nausea or vomiting headache or chest pain or palpitations.  Called yesterday with more anxiety.  Had increased Auvelity for 1 day and reduced Seroquel XR for 1 day.  Felt restless and fearful  05/01/2022 appointment noted: clonidine 0.3 mg twice daily, lorazepam 1 mg twice daily for anxiety and Adderall XR 20 mg in the morning. Seroquel XR 150 HS, Auvelity 1 BID Received Spravato 84 mg today as scheduled.  Tolerated it well without nausea or vomiting headache or chest pain or palpitations.  Nurse has noted patient has called multiple times sometimes asking the same question repeatedly.  It is unclear whether she is truly forgetful or is just anxious seeking reassurance. Patient acknowledges ongoing depression as well as some anxiety but states she has felt a little better in the last couple of days.  She has reduced the Seroquel to 150 mg at night and has increased Auvelity to 1 twice daily but only for 1 day.  So far she seems to be tolerating it.  Past Psychiatric Medication Trials: fluoxetine, duloxetine, Viibryd, lamotrigine, Pristiq, sertraline, citalopram,  Trintellix anxious and SI Wellbutrin  Adderall, Adderall XR, Vyvanse, Ritalin, Strattera low dose  NR Lorazepam Trazodone  Depakote,  lamotrigine cog complaints Lithium remotely Abilify 7.5  Vraylar 1.5 mg daily agitation and insomnia Rexulti insomnia Latuda 40 one dose, CO anxious and SI Seroquel XR 300  At visit November 12, 2019. We discussed Patient developed an increasingly severe alcohol dependence problem since her last visit in January.  She went to [facility] and has had no alcohol since then except 1 day.  She never abused stimulants but they took her off the stimulants at [facility].  Her ADD was markedly worse.  The Wellbutrin did not help the ADD.   D history lamotrigine rash at 66 yo  Review of Systems:  Review of Systems  Constitutional:  Positive for fatigue.  Cardiovascular:  Negative for chest pain and palpitations.  Musculoskeletal:  Positive for arthralgias, back pain and joint swelling.       SP hip surgery October 2020  Neurological:  Negative for dizziness and tremors.  Psychiatric/Behavioral:  Positive for decreased concentration, dysphoric mood and sleep disturbance. Negative for behavioral problems, confusion, hallucinations, self-injury and suicidal ideas. The patient is nervous/anxious. The patient is not hyperactive.     Medications: I have reviewed the patient's current medications.  Current Outpatient Medications  Medication Sig Dispense Refill   [START ON 05/15/2022] amphetamine-dextroamphetamine (ADDERALL XR) 20 MG 24 hr capsule Take 1 capsule (20 mg total) by mouth every morning. 30 capsule 0   amphetamine-dextroamphetamine (ADDERALL XR) 20 MG 24 hr capsule Take 1 capsule (20 mg total) by mouth every morning. 30 capsule 0   cloNIDine (CATAPRES) 0.2 MG tablet Take 1 tablet (0.2 mg total) by mouth 2 (two) times daily. 60 tablet 0   Dextromethorphan-buPROPion ER (AUVELITY) 45-105 MG TBCR Take 1 tablet by mouth 2 (two) times daily. 60 tablet 1   Esketamine HCl, 84 MG Dose, (SPRAVATO, 84 MG DOSE,) 28 MG/DEVICE SOPK USE 3 SPRAYS IN EACH  NOSTRIL TWICE WEEKLY 3 each 5   LORazepam (ATIVAN) 1 MG tablet Take 1 tablet (1 mg total) by mouth every 6 (six) hours as needed for anxiety. 100 tablet 0   nebivolol (BYSTOLIC) 2.5 MG tablet Take 2.5 mg by mouth daily.     QUEtiapine (SEROQUEL XR) 300 MG 24 hr  tablet Take 1 tablet (300 mg total) by mouth at bedtime. (Patient taking differently: Take 150 mg by mouth at bedtime.) 30 tablet 0   iron polysaccharides (NIFEREX) 150 MG capsule TAKE 1 CAPSULE BY MOUTH EVERY DAY 90 capsule 1   No current facility-administered medications for this visit.    Medication Side Effects: None  Allergies:  Allergies  Allergen Reactions   Metronidazole Shortness Of Breath and Other (See Comments)    Heart pounding   Ferrlecit [Na Ferric Gluc Cplx In Sucrose] Other (See Comments)    Infusion reaction 05/12/2019    Past Medical History:  Diagnosis Date   ADHD    Anemia    Anxiety    Arthritis    Depression    Heart murmur    i went to see a cardiologit slast eyar  and i had zero plaque,    PONV (postoperative nausea and vomiting)    Recovering alcoholic in remission ([place])     Family History  Problem Relation Age of Onset   Atrial fibrillation Mother    CAD Father     Social History   Socioeconomic History   Marital status: Married    Spouse name: Not on file   Number of children: Not on file   Years of education: Not on file   Highest education level: Not on file  Occupational History   Not on file  Tobacco Use   Smoking status: Former    Types: Cigarettes    Quit date: 08/16/2003    Years since quitting: 18.7   Smokeless tobacco: Never   Tobacco comments:     08-28-2019 "i smoked 2 cigarettes in the last month since my father  passed"  Vaping Use   Vaping Use: Never used  Substance and Sexual Activity   Alcohol use: Yes    Alcohol/week: 10.0 standard drinks of alcohol    Types: 10 Glasses of wine per week   Drug use: No   Sexual activity: Not on file  Other Topics Concern    Not on file  Social History Narrative   Not on file   Social Determinants of Health   Financial Resource Strain: Not on file  Food Insecurity: Not on file  Transportation Needs: Not on file  Physical Activity: Not on file  Stress: Not on file  Social Connections: Not on file  Intimate Partner Violence: Not on file    Past Medical History, Surgical history, Social history, and Family history were reviewed and updated as appropriate.   Please see review of systems for further details on the patient's review from today.   Objective:   Physical Exam:  There were no vitals taken for this visit.  Physical Exam Constitutional:      General: She is not in acute distress. Neurological:     Mental Status: She is alert and oriented to person, place, and time.     Coordination: Coordination normal.     Gait: Gait normal.  Psychiatric:        Attention and Perception: Perception normal. She is attentive.        Mood and Affect: Mood is anxious and depressed. Affect is blunt. Affect is not labile, angry, tearful or inappropriate.        Speech: Speech is not rapid and pressured or slurred.        Behavior: Behavior is not slowed.        Thought Content: Thought content is not paranoid or delusional. Thought content  does not include homicidal or suicidal ideation. Thought content does not include suicidal plan.        Cognition and Memory: Cognition normal. She does not exhibit impaired recent memory.        Judgment: Judgment normal.     Comments: Insight intact. No auditory or visual hallucinations. No delusions.  Depression  ongoing. Affect with some smiling but still depressed and more blunted than usual. No Sui intent plan      Lab Review:     Component Value Date/Time   NA 137 01/12/2021 1430   NA 140 11/18/2018 1544   K 3.8 01/12/2021 1430   CL 108 01/12/2021 1430   CO2 22 01/12/2021 1430   GLUCOSE 94 01/12/2021 1430   BUN 14 01/12/2021 1430   BUN 20 11/18/2018  1544   CREATININE 0.82 01/12/2021 1430   CALCIUM 8.9 01/12/2021 1430   PROT 6.6 01/12/2021 1430   ALBUMIN 3.9 01/12/2021 1430   AST 12 (L) 01/12/2021 1430   ALT 11 01/12/2021 1430   ALKPHOS 46 01/12/2021 1430   BILITOT 0.5 01/12/2021 1430   GFRNONAA >60 01/12/2021 1430   GFRAA >60 09/02/2019 0249   GFRAA >60 01/27/2019 0811       Component Value Date/Time   WBC 4.5 01/12/2021 1430   RBC 4.32 01/12/2021 1430   HGB 12.8 01/12/2021 1430   HGB 12.9 07/17/2019 0953   HCT 38.5 01/12/2021 1430   HCT 21.9 (L) 12/25/2018 1221   PLT 272 01/12/2021 1430   PLT 286 07/17/2019 0953   MCV 89.1 01/12/2021 1430   MCH 29.6 01/12/2021 1430   MCHC 33.2 01/12/2021 1430   RDW 12.4 01/12/2021 1430   LYMPHSABS 1.4 01/12/2021 1430   MONOABS 0.4 01/12/2021 1430   EOSABS 0.0 01/12/2021 1430   BASOSABS 0.0 01/12/2021 1430    No results found for: "POCLITH", "LITHIUM"   No results found for: "PHENYTOIN", "PHENOBARB", "VALPROATE", "CBMZ"   .res Assessment: Plan:    Recurrent major depression resistant to treatment ([place]) - Plan: TSH, B12 and Folate Panel  Generalized anxiety disorder - Plan: cloNIDine (CATAPRES) 0.2 MG tablet, DISCONTINUED: cloNIDine (CATAPRES) 0.3 MG tablet  Attention deficit hyperactivity disorder (ADHD), predominantly inattentive type  Accelerated hypertension - Plan: cloNIDine (CATAPRES) 0.2 MG tablet, DISCONTINUED: cloNIDine (CATAPRES) 0.3 MG tablet  Insomnia due to mental condition   She has treatment resistant major depression at this time.  Have  discussed some of her recent abnormal behaviors leading to this depressive episode getting worse which she says were associated with heavy use of delta 8 and not a manic episode.  She realizes now that that was not good for her.  She stopped all use of other drugs including those available over-the-counter such as delta 8 or any other THC related products.  She is no longer having any of those types of behaviors and instead is  depressed. She remains persistently depressed with lack of interest and lack of feeling for things that normally she would have feelings about.  She has low motivation and energy.  She is sad and down.  She is less productive than usual.  Her concentration is poor.  She has high degree of anxiety as well. She has a very negative self-image and low self-esteem.  These are all uncharacteristic for her  Also staff has noted that she calls repeatedly asking the same question over again.  Unclear if this is related to truly being forgetful or if she is simply seeking reassurance because  of anxiety and depression which is most likely the cause.  Because of the question of cognitive issues we will check TSH, B12 and folate  Failed multiple antidepressants.  Many of them were not actual failures but intolerances and it is unclear whether some of that was more connected with anxiety than true side effects.  1 example is the [place].  In general she does not want to try anything but an antidepressant but has failed all major categories of antidepressants except TCAs and MAO inhibitors which have not been tried.  Could consider a TCA but given her manic response to delta 8 there is some concern about the risk of a TCA triggering mania.  It could be combined with lithium but she generally is unfavorable about taking lithium.  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  She wants to continue Spravato.  We discussed discussed the side effects in detail as well as the protocol required to receive Spravato.  S  Has been taking Seroquel XR since 03/20/2022.   Have reduced Seroquel XR to one half of a 300 mg tablet at night with plan to discontinue it because of lack of sufficient efficacy.  Continue Auvelity to 1 twice daily for the last week. .  To try to optimize dose  Started  Spravato 84 mg twice weekly on 03/16/2022.  Continue  Patient was administered Spravato 84 mg intranasally today.  The patient experienced the typical dissociation which gradually resolved over the 2-hour period of observation.  There were no complications.  Specifically the patient did not have nausea or vomiting or headache.  Blood pressures remained within normal ranges at the 40-minute and 2-hour follow-up intervals.  By the time the 2-hour observation period was met the patient was alert and oriented and able to exit without assistance.  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  See nursing note for further details. Patient was scheduled on 04/30/2022 but it had to be rescheduled because of hypertension unmanaged. Blood pressure today was within normal limits.  Adderall to XR 20 mg AM Clonidine i0.2 mg twice daily off label for anxiety and for blood pressure which has not been well managed lately until today.  Ok temporary Ativan 1 mg 4 times daily as needed anxiety but try to cut it back. Is not ideal to use benzodiazepine with stimulant but because of the severity of her symptoms it has been necessary.  Hope to eventually eliminate the benzodiazepine.  Expected as her depression improves her anxiety will improve as well.  However lately her anxiety has been unmanageable.  We will expect that to improve as the depression improves  She needs to continue to have a conversation with primary care doctor about her blood pressure medicines.  She was prescribed Bystolic but she is not certain whether she is consistently taking it.  She is working to improve this consistency. Take Bystolic consistently per PCP.  B blood pressure was better today  Discussed potential benefits, risks, and side effects of stimulants with patient to include increased heart rate, palpitations, insomnia, increased anxiety, increased  irritability, or decreased appetite.  Instructed patient to contact office if experiencing any significant tolerability issues. She wants to return to usual dose of Adderall for ADD bc of mor poor cognitive function with reduction.  Also  discussed that depression will impair cognitive function.  Discussed safety plan at length with patient.  Advised patient to contact office with any worsening signs and symptoms.  Instructed patient to go to the [facility] emergency room for evaluation if experiencing any acute safety concerns, to include suicidal intent.  Has Maintained sobriety  FU with Spravato twice weekly  Lynder Parents, MD, DFAPA     Please see After Visit Summary for patient specific instructions.  No future appointments.         Orders Placed This Encounter  Procedures   TSH   B12 and Folate Panel       -------------------------------

## 2022-05-01 NOTE — Telephone Encounter (Signed)
Please send her refill

## 2022-05-01 NOTE — Addendum Note (Signed)
Addended by: Kirstie Peri on: 05/01/2022 07:37 PM   Modules accepted: Orders

## 2022-05-02 ENCOUNTER — Encounter: Payer: 59 | Admitting: Psychiatry

## 2022-05-03 ENCOUNTER — Ambulatory Visit (INDEPENDENT_AMBULATORY_CARE_PROVIDER_SITE_OTHER): Payer: 59 | Admitting: Psychiatry

## 2022-05-03 ENCOUNTER — Ambulatory Visit: Payer: 59

## 2022-05-03 VITALS — BP 184/100 | HR 39

## 2022-05-03 DIAGNOSIS — I10 Essential (primary) hypertension: Secondary | ICD-10-CM

## 2022-05-03 DIAGNOSIS — F339 Major depressive disorder, recurrent, unspecified: Secondary | ICD-10-CM | POA: Diagnosis not present

## 2022-05-03 DIAGNOSIS — F5105 Insomnia due to other mental disorder: Secondary | ICD-10-CM | POA: Diagnosis not present

## 2022-05-03 DIAGNOSIS — F900 Attention-deficit hyperactivity disorder, predominantly inattentive type: Secondary | ICD-10-CM

## 2022-05-03 DIAGNOSIS — F411 Generalized anxiety disorder: Secondary | ICD-10-CM

## 2022-05-04 ENCOUNTER — Encounter: Payer: Self-pay | Admitting: Psychiatry

## 2022-05-04 NOTE — Progress Notes (Signed)
Medical Assistant note:    Pt is being treated for Treatment Resistant Depression, pt will be receiving 84 mg which will continue to be her maintenance dose. All Spravato medication is stored at doctors office per REMS/FDA guidelines. The medication is required to be locked behind two doors per FDA/REMS Protocol. Medication is also disposed of properly per regulations.      Began taking patient's vital signs. First reading was 153/89, 58. Retook vitals 15 minutes later and they were 124/79, 58.  Patient blew her nose and reclined to 45 degrees. Proceeded to administer 3 doses of 28 mg Spravato at 15 minute intervals. She had no complaints during administration. Patient's vitals were taken again 40 minutes after administration and again at the end of the 120 minutes. Dr. Jennelle Human did come visit with patient and discuss how treatment went at the end.    Patient listened to music and slept. She tolerated treatment well. Patient does experience dissociation, which is resolved at the end of treatment.   VITALS:  Beginning 124/79, 58 40 minutes: 103/59, 58 Ending: 154/92, 46    LOT [id number] EXP 2025 - Apr

## 2022-05-08 ENCOUNTER — Other Ambulatory Visit: Payer: Self-pay | Admitting: Psychiatry

## 2022-05-08 ENCOUNTER — Telehealth: Payer: Self-pay | Admitting: Psychiatry

## 2022-05-08 DIAGNOSIS — F411 Generalized anxiety disorder: Secondary | ICD-10-CM

## 2022-05-08 DIAGNOSIS — I10 Essential (primary) hypertension: Secondary | ICD-10-CM

## 2022-05-08 MED ORDER — CLONIDINE HCL 0.2 MG PO TABS: 0.1000 mg | ORAL_TABLET | Freq: Two times a day (BID) | ORAL | 0 refills | Status: DC

## 2022-05-08 NOTE — Telephone Encounter (Addendum)
RTC  H Michael NA and mailbox full.  Could not leave message.  Pt  -  talked to she and H on speaker. H worried over wife.  Vacant stare.  Slurs words at times.  Not smiling. Reduced enjoyment.  Depression.  Withdrawn from usual activities.  Some irritability.  Anxious. Disc her concerns meds are making her worse.  Extensive discussion about her treatment resistant status.  There is a consistent pattern of not taking the medicines long enough to get benefit because she believes the meds are making her worse.  However the symptoms she describes as side effects are exactly the same symptoms that she had prior to taking the medication RX for  the depression.  So it is not clear that these are actual side effects. This is true about the 2 most recent meds including Seroquel and Auvelity.  Recommend psychiatric consultation in hopes of improving her comfort level with taking prescribed medications for a sufficient length of time to provide benefit.  Extensive discussion about ECT is the treatment of choice for treatment resistant depression.  Spravato may work if she can comply with consistency.  There are medication options but they take longer to work.    Plan:  Reduce clonidine to 0.1 mg BID DT bradycardia.  Talk with PCP about BP and low pulse problems which are interfering with her consistent compliance with Spravato.   Limit lorazepam to 3 -4  mg daily max. Excess use is the cause of slurring speech.  She must stop excess use or will have to stop the med. Stop Auvelity per her request.  But she has only been on the full dose for a little over a week and clearly has not had time to get benefit from it.  She thinks maybe it is making her more anxious. Reduce Seroquel from 150XR to 50 -100 mg at night IR.  She couldn't sleep when stopped it completely. Will not start new antidepressant until her SE issues are resolved or not. Get second psych opinion from Wellington Hampshire MD or another psychiatrist.  H's  sister is therapist in [place], MD, DFAPA

## 2022-05-09 ENCOUNTER — Other Ambulatory Visit: Payer: Self-pay | Admitting: Psychiatry

## 2022-05-09 MED ORDER — QUETIAPINE FUMARATE 50 MG PO TABS: 50.0000 mg | ORAL_TABLET | Freq: Every day | ORAL | 0 refills | Status: DC

## 2022-05-11 NOTE — Telephone Encounter (Signed)
Noted  

## 2022-05-14 ENCOUNTER — Telehealth: Payer: Self-pay | Admitting: Psychiatry

## 2022-05-14 NOTE — Telephone Encounter (Signed)
I don't have enough rooms in the afternoon on Wednesday if that's what day you are referring to. I have 2 patients at 2 pm and 1 patient at 3 pm.

## 2022-05-14 NOTE — Telephone Encounter (Signed)
Dr. Nadyne Coombes called and just saw [name]. She said that her blood pressure was 120/80 in her office and Dr. Hal Hope states it is safe for her to do the Spravato injections. If any questions please call her at  [phone]

## 2022-05-16 ENCOUNTER — Ambulatory Visit (INDEPENDENT_AMBULATORY_CARE_PROVIDER_SITE_OTHER): Payer: 59 | Admitting: Psychiatry

## 2022-05-16 ENCOUNTER — Ambulatory Visit: Payer: 59

## 2022-05-16 ENCOUNTER — Encounter: Payer: Self-pay | Admitting: Psychiatry

## 2022-05-16 VITALS — BP 115/70 | HR 52

## 2022-05-16 DIAGNOSIS — F339 Major depressive disorder, recurrent, unspecified: Secondary | ICD-10-CM

## 2022-05-16 DIAGNOSIS — F900 Attention-deficit hyperactivity disorder, predominantly inattentive type: Secondary | ICD-10-CM

## 2022-05-16 DIAGNOSIS — F5105 Insomnia due to other mental disorder: Secondary | ICD-10-CM | POA: Diagnosis not present

## 2022-05-16 DIAGNOSIS — F411 Generalized anxiety disorder: Secondary | ICD-10-CM | POA: Diagnosis not present

## 2022-05-16 DIAGNOSIS — I10 Essential (primary) hypertension: Secondary | ICD-10-CM | POA: Diagnosis not present

## 2022-05-16 NOTE — Progress Notes (Signed)
Laura Chang [id number] [birth year] 66 y.o.  Subjective:   Patient ID:  Laura Chang is a 66 y.o. (DOB Mar 22, 1956) female.  Chief Complaint:  Chief Complaint  Patient presents with   Follow-up   Depression   Fatigue     HPI Laura Chang presents to the office today for follow-up of depression and anxiety and ADD.  seen November 12, 2019.  Melted down in 2020.  Went to [employer] in July.  No withdrawal.  1 drink since then.  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 She was on no stimulant and no SSRI but was taking Strattera and Wellbutrin.  The following changes were made. Stop Strattera. OK restart stimulant bc severe ADD Restart Adderall 1 daily for a few days and if tolerated then restart 1 twice daily. If not tolerated reduce the dosage if needed. May need to stop Wellbutrin if not tolerating the stimulant.  Yes.  DC Wellbutrin Restart Prozac 20 mg daily.  February 2021 appointment with the following noted: Completed grant proposal.  Couldn't doit without Adderall.  Sold a bunch of work.   Adderall XR lasts about 3 pm.  Strength seems about right.  BP been OK.  Not jittery.   Stopped Wellbutrin but had no SE. Mood drastically better with grant proposal and back on fluoxetine.  Less depressed and lethargic.  No anxiety.  Cut back on coffee. Started back with devotions and stronger faith. Plan: Continue Prozac 20 mg daily. May have to increase the dose at some point in the future given that she usually was taking higher dosages but she is getting good response at this time. Restart Wellbutrin off label for ADD since can't get 2 ADDERALL daily. 150 mg daily then 300 mg daily. She can adjust the dose between 150 mg and 300 mg daily to get the optimal effect.   05/11/2020 appointment with the following noted: Has been inconsistent with Prozac and Wellbutrin. Not sure of the effect of Wellbutrin. Biggest deterrent in work is anxiety.  Some of  the work is conceptual and difficult at times.  Can feel she's not up to a project at times.  Overall is OK but would like a steadier benefit from stimulant.  Exhausted from managing concentration and keeping up with things from the day.  Loses things.  Not good keeping up with schedule. Overall productive and emotionally OK. Can feel Adderall wear off. Mood is better in summer and worse in the winter.   F died in 10/17/23 and that is a loss. No SE Wellbutrin. Still attends AA meetings.  Real benefit from [place] last year. Recognizes effect of anemia on ADD and mood.  Had iron infusions last winter. Plan:  Wellbutrin off label for ADD since can't get 2 ADDERALL daily. 150 mg daily then 300 mg daily.  01/24/2021 appointment with following noted: Doing a program called [employer] mindfulness app since Xmas.  CBT app helped the depression.  App helped her focus better.  Lost sign weight. Writing a lot. Before Xmas felt depressed and started negative thinking worse, self denigrating. Not drinking. More isolated.   Recognizes mo is narcissist.    Didn't tell anyone she was born until 3 mos later.  M aloof and uninterested in pt.  Lied about her birthday.  Mo lack of affection even with pt's kids. Going to [place] for a year and it helped her to quit drinking. Also misses kids being gone with a hole  also. Plan: No med changes  05/04/2021 appointment with the following noted: Therapist [name] thinks she's manic. Lost weight to 144#.   States she is still sleeping okay.  Admits she is hyper and recognizes that she is likely manic.  She feels great, euphoric with an increased sense of spiritual connectedness to God.  She has racing thoughts and talks fast and talks a lot and this is noted by her husband.  He thinks she is a bit hyper.  She has been able to maintain sobriety although she will have 1 glass of wine on special occasions but does not drink by herself.  She is not drinking to excess.   She denies any dangerous impulsivity.  She is clearly not depressed and not particularly anxious.  She has no concerns about her medication and she has been compliant.  06/16/21 appt noted: So much better.  Going through a lot but the manic thing happened on top of it.  So much slower.  Didn't feel like losing anything with risperidone.  Likes the Adderalll at 10 mg. Some drowsiness in the AM and very drowsy from risperidone 2 mg HS. Prayer life is better. Handling stress better. Less depressed with risperidone. Still likes trazodone. Sleeps well. Plan: Reduce Prozac to 10 mg daily.  Consider stopping it because it can feel the mania however she is reluctant to do that because she fears relapse of depression. Reduce risperidone to 1.5 mg nightly due to side effects.  Discussed risk of worsening mania.  07/25/2021 appointment with the following noted: Misses the Adderall and hard to function without it. Depressed now. Heavy chest.  Anxious and guilty.  Body feels heavy.   Hates Wellbutrin.   Plan: Increase fluoxetine to 20 mg daily Add Abilify 1/2 of 15 mg tablet daily Wean wellbutrin by 1 tablet each week  bc she feels it is not helpful and DT polypharmacy Reduce risperidone to 1 daily for 1 week and stop it. Disc risk of mania. Increase Adderall to XR 20 mg AM  08/08/21 Much less depressed and starting to feel normal I feel a lot better. No SE.  Speech normal off risperidone. Sleeping OK on trazaodone and enough.   Noticed benefit from Adderall again. Plan: continue fluoxetine to 20 mg daily Continue Abilify 1/2 of 15 mg tablet daily for depression and mania continue Adderall to XR 20 mg AM  10/10/2021 phone call: Pt stated she feels like the Abilify should be decreased to 53m.She said she is depressed but rational and not suicidal.She has an appt Monday and can wait until then if you prefer. MD response: Reduce the Abilify to 7.5 mg every other day.  We will meet on 10/16/2021 and decide  what to do from there.  10/16/2021 appointment with the following noted: More depressed.  Most depressed I've ever been.  Just numb.  Sense of grief.   Thinks the manic episode was unlike anything else she ever had.  Doesn't want to medicate against it.  Don't enjoy people.  Easily overwhelmed.  Had some death thoughts but not suicidal.  Has been functional.  Feels better today after reducing Abilify to every other day but she is only been doing that for 3 days. A/P: Episode of post manic depression was explained. continue fluoxetine to 20 mg daily Hold Abilify for 1 week then resume Abilify 1/2 of 15 mg tablet every other day for depression and mania continue Adderall to XR 20 mg AM  10/27/2021 appointment with the  following noted: I'm doing so much better.  Handling the depression better. Better self talk and spiritual focus has helped.   Dep 6/10 manifesting as anxiety with low confidence.   F died 2  years ago and M 66 yo and is dependent . She is working hard to feel better but still feels depressed.  She almost feels like she has a little more anxiety since restarting Abilify every other day. Plan: continue fluoxetine to 20 mg daily DC Abilify .  Vrayalar 1.5 mg QOD to try to get rid of depression ASAP. continue Adderall to XR 20 mg AM  11/10/2021 appointment with the following noted: Busy with Xmas and it was fun with family but then a big let down.  Did well with it.  Functioned well with it.  Working hard on things with depression.  Not shutting down. Not sure but feels better today but yesterday was hard.  Difficulty dealing with mother.  She won't do anything to help herself.  Yesterday with her all day.  Won't do PT and has isolated herself.    Lack of confidence.   No SE with Vraylar.  11/24/21 urgent appointment appt noted: More and more depressed.   So anxious and doesn't want to be alone but can do so. No appetite. Hurts inside. Has had some fleeting suicidal thoughts but  would not act on them.  Tolerating meds. Has been consistent with Vraylar 1.5 mg every other day, fluoxetine 20 mg daily Plan: Increase Vraylar to 1.5 mg daily Change Prozac to Trintellix 10 mg daily. Discussed side effects of each continue Adderall to XR 20 mg AM  12/27/2021 appointment with the following noted: Not OK.  I feel less depressed but feels bat shit. Not sleeping well.  Extremely anxious. Off and on sleep. 3-4 hours of sleep.   Still having daily SI.  But also become obvious has so much to do.  Overwhelmed by tasks.   Needs anxiety meds to just function. Not more motivated.  Walked yesterday.   Feels afraid like in trouble but not irritable or angry. DC DT agitation Vraylar to 1.5 mg daily Change Prozac to Trintellix 10 mg daily. Hold Adderall to XR 20 mg AM Clonidine 0.1 1/2 tablet twice daily for 2 days and if needed for anxiety and sleep increase to 1 twice daily Ok temporary Ativan 1 mg 3 times daily as needed anxiety  01/05/22 appt noted: Off fluoxetine and  Trintellix.  Only on Ativan, trazodone and Adderall XR 20 plus added clonidine 0.1 mg BID Didn't think she needed to start Trintellix. Not taking Ativan.   Didn't like herself last week. Feels some better today. Wonders if the manic sx Not agitated.  Anxiety kind of calmed down.  A lot to be anxious about situationally.  $ stress. Concerns about downers with meds. Can't access normal personality. ? Lethargy and inability to talk as sE. Plan: Latuda 20-40 mg daily with food. Adderall to XR 20 mg AM Clonidine 0.1 1/2 tablet twice daily  reduce dose to be sure no SE Ok temporary Ativan 1 mg 3 times daily as needed anxiety  01/19/22 appt noted: Taking Latuda 20 mg daily.  Took 40 mg once and felt anxious and  SI Still depressed and not very reactive Anxiety mainly about the depression and fears of the future. She wants to revisit manic sx and thinks it was maybe bc taking delta 8 bc was taking a lot of it so  still doesn't think she's  classic bipolar. She wants to only take Prozac bc thinks Latuda is perpetuating depression. Says the delta 8 was very psychaedelic.  When not taking it was not manic.  Sleeping ok again.  Plan: Per her request DC Latuda 20-40 mg daily with food. She wants to continue Prozac alone AMA  Adderall to XR 20 mg AM Clonidine 0.1 1/2 tablet twice daily  reduce dose to be sure no SE Ok temporary Ativan 1 mg 3 times daily as needed anxiety  01/23/2022 phone call complaining of increased anxiety since stopping Latuda.  She will try increasing clonidine.  01/26/2022 phone call not feeling well and wanted to restart the Vraylar.  However notes indicate that had made her agitated therefore she was encouraged to pick up samples of Rexulti 1 mg and start that instead.  02/06/2022 phone call: Stating she felt the Rexulti was helping with depression but she was not sleeping well and obsessing over things.  She was encouraged to increase Rexulti to 2 mg daily and increase trazodone for sleep.  02/09/2022 appointment with the following noted: This was an urgent work in appointment No sleep last night with trazodone 100 mg HS Nothing really better depression or anxiety. Ruminating negative anxious thoughts. Did not tolerate Rexulti because it was causing insomnia.  Does not think it helped depression.  Lacks emotion that she should have.  Lacks her usual personality.  Some hopeless thoughts.  Some death thoughts.  Some suicidal thoughts without plan or intent Plan: DC Rexulti and Prozac & DC trazodone Adderall to XR 20 mg AM Clonidine 0.1 1/tablet twice daily  reduce dose to be sure no SE Ok temporary Ativan 1 mg 3 times daily as needed anxiety Start Seroquel XR 150 mg nightly  03/02/2022 appointment: Langley Gauss called back a few days after starting Seroquel stating it was making her more anxious and more depressed.  This seemed unlikely as this medicine rarely ever causes anxiety.  She  stopped the medication waited 3 days and called back still had anxiety and depression but thought perhaps the anxiety was a little better.  She did not want to take the Seroquel. She knew about the option of Spravato and wanted to pursue that. Now questions whether to return to Seroquel while waiting to start Spravato bc feels just as bad without it and knows she didn't give it enough time to work.   MADRS 46  ECT-MADRS    Flowsheet Row Office Visit from 03/02/2022 in [facility]  MADRS Total Score 46      03/14/22 appt noted: Pt received Spravato 56 mg first dose today with some dissociative sx which were not severe.  She was anxious prior to the administration and felt better after receiving lorazepam 1 mg.  No NV, or HA. Wants to continue Spravato. Ongoing depression and desperate to feel better.  I'm not myself DT deprsssion which is most severe in recent history.  Anhedonia.  Low motivation.  Social avoidance. Continues to think all recent med trials are making her worse.  Sleep ok with Seroquel.  03/16/22 appt noted: Received Spravato 84 mg for the first time.  some dissociative sx which were not severe.  She was anxious prior to the administration and felt better after receiving lorazepam 1 mg.  No NV, or HA. Wants to continue Spravato.   Does not feel any better or different since the last appt.  Ongoing depression.  Ongoing depression and desperate to feel better.  I'm not myself DT deprsssion  which is most severe in recent history.  Anhedonia.  Low motivation.  Social avoidance. Continues to think all recent med trials are making her worse.  Sleep ok with Seroquel.  Does not want to continue Seroquel for TRD.  03/20/2022 appointment noted: Came for Spravato administration today.  However blood pressure was significantly elevated approximately 180/115.  She was given lorazepam 1 mg and clonidine 0.2 mg to try to get it down. She states she regretted stopping the Seroquel  XR 300 mg tablets.  She now realizes it was helpful.  She did not sleep much at all last night.  She did not take the Adderall this morning. 2 to 3 hours after arrival blood pressure was still elevated at  170/110, 62 pulse.  For Spravato administration was canceled for today.  She admits to being anxious and depressed.  She is not suicidal.  She is highly motivated to receive the Spravato.  We discussed getting it tomorrow.  03/22/2022 appointment noted: Patient's blood pressure was never stable enough yesterday in order to get her in for Spravato administration.  She was encouraged to see her primary care doctor.  It is better today.  03/26/2022 appointment with the following noted: Blood pressure was better.  Saw her primary care doctor who started on oral Bystolic 2.5 mg daily. Received Spravato 84 mg today as scheduled.  Tolerated it well without nausea or vomiting headache or chest pain or palpitations.  Her blood pressure was borderline but manageable. She remains depressed and anxious.  She is ambivalent about the medicine and desperate to get to feel better.  Continues to have anhedonia and low energy and low motivation and reduced ability to do things.  Less social.  Not suicidal.  03/28/22 appt noted: Received Spravato 84 mg today as scheduled.  Tolerated it well without nausea or vomiting headache or chest pain or palpitations.  Her blood pressure was borderline but manageable. Has not seen any improvement so far.  Tolerating Seroquel.  Inconsistent with Bystolic and BP has been borderline high. Still depressed and anxious and anhedonia.  Low motivation, energy, productivity. Taking quetiapine and tolerating XR 300 mg nightly.  04/04/22 appt noted: Received Spravato 84 mg today as scheduled.  Tolerated it well without nausea or vomiting headache or chest pain or palpitations.  Her blood pressure was borderline but manageable. Has not seen any improvement so far.  Tolerating Seroquel.    She still tends to think that the medications are making her worse.  She has said this about each of the recent psychiatric medicines including Seroquel.  However her husband thinks she is improved.  She also admits there is some improvement in productivity.  She still feels highly anxious.  She still does not enjoy things as normal.  She still feels desperate to improve as soon as possible. Has been taking Seroquel XR since 03/20/2022  04/10/22 appt noted: Received Spravato 84 mg today as scheduled.  Tolerated it well without nausea or vomiting headache or chest pain or palpitations.  Her blood pressure was borderline but manageable. Has not seen any improvement so far.  Tolerating Seroquel.  Doesn't like Seroquel bc she thinks it flattens here. 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  Tolerated it well without nausea or vomiting headache or chest pain or palpitations.  Her blood pressure was borderline but manageable. Has not seen any  improvement so far.  Tolerating Seroquel.  Doesn't like Seroquel bc she thinks it flattens her. Received Spravato 84 mg today as scheduled.  Tolerated it well without nausea or vomiting headache or chest pain or palpitations.  Her blood pressure was borderline but manageable. Has not seen any improvement so far.  Tolerating Seroquel.  Doesn't like Seroquel bc she thinks it flattens here.  We discussed her ambivalence about it. She is starting Auvelity and has tolerated it the last 2 days without side effect.  She still does not feel like herself and feels flat and not enjoying things with suppressed expressed emotion  04/17/2022 appointment with the following noted: Received Spravato 84 mg today as scheduled.  Tolerated it well without nausea or vomiting headache or chest pain or palpitations.  Her  blood pressure was borderline but manageable. Has not seen any improvement so far.  Tolerating Seroquel.  Doesn't like Seroquel bc she thinks it flattens her. She has been tolerating the Auvelity 1 in the morning without side effects for about a week.  She has not noticed significant improvement so far.  She still feels depressed and flat and not herself.  Other people notice that she is flat emotionally.  She is not suicidal.  She does feel discouraged that she is not getting better yet.  04/19/2022 appointment noted: Has increased Auvelity to 1 twice daily for 2 days, continues quetiapine XR 300 mg nightly, clonidine 0.3 mg twice daily, lorazepam 1 mg twice daily for anxiety and Adderall XR 20 mg in the morning. No obious SE but she still thinks quetiapine XR is making her feel down.  But not sedated Received Spravato 84 mg today as scheduled.  Tolerated it well without nausea or vomiting headache or chest pain or palpitations.  Her blood pressure was borderline but manageable. She still feels quite anxious and feels it necessary to take both the clonidine and lorazepam twice a day to manage her anxiety.  She has been consistently down and flat and not herself until yesterday afternoon she noted an improvement in mood and feeling much more like herself with her normal personality reemerging.  She was quite depressed in the morning with very dark negative thoughts.  She did not have those dark negative thoughts this morning.  She had a lot of questions about medication and when she was expecting to be improved and why she has not shown improvement up to now.  04/23/22 appt noted: Has increased Auvelity to 1 twice daily for 1 week, continues quetiapine XR 300 mg nightly, clonidine 0.3 mg twice daily, lorazepam 1 mg twice daily for anxiety and Adderall XR 20 mg in the morning. No obious SE but she still thinks quetiapine XR is making her feel down.  But not sedated Received Spravato 84 mg today as  scheduled.  Tolerated it well without nausea or vomiting headache or chest pain or palpitations.  She is still depressed but admits better function and is able to enjoy social interactions. Tolerating meds.  Would like to feel better for sure. Not herself.  Flat. Plan increase Auvelity to 1 tab BID as planned and reduce Quetiapine to 1/2 of ER 300 mg  bc NR for depression.  04/25/2022 appointment with the following noted: clonidine 0.3 mg twice daily, lorazepam 1 mg twice daily for anxiety and Adderall XR 20 mg in the morning. Seroquel XR 300 HS No obious SE but she still thinks quetiapine XR is making her feel down.  But not sedated Received  Spravato 84 mg today as scheduled.  Tolerated it well without nausea or vomiting headache or chest pain or palpitations.  Called yesterday with more anxiety.  Had increased Auvelity for 1 day and reduced Seroquel XR for 1 day.  Felt restless and fearful  05/01/2022 appointment noted: clonidine 0.3 mg twice daily, lorazepam 1 mg twice daily for anxiety and Adderall XR 20 mg in the morning. Seroquel XR 150 HS, Auvelity 1 BID Received Spravato 84 mg today as scheduled.  Tolerated it well without nausea or vomiting headache or chest pain or palpitations.  Nurse has noted patient has called multiple times sometimes asking the same question repeatedly.  It is unclear whether she is truly forgetful or is just anxious seeking reassurance. Patient acknowledges ongoing depression as well as some anxiety but states she has felt a little better in the last couple of days.  She has reduced the Seroquel to 150 mg at night and has increased Auvelity to 1 twice daily but only for 1 day.  So far she seems to be tolerating it.  05/03/22 appt noted: clonidine 0.2 mg twice daily, lorazepam 1 mg twice daily for anxiety and Adderall XR 20 mg in the morning. Seroquel XR 150 HS, Auvelity 1 BID BP high this am about 170/100 and received extra clonidine 0.2 mg and came to receive  Spravato.  Not dizzy, no SOB, nor CP but BP is still high Could not receive Spravato today bc BP high and pulse low at 30 ppm. Still depressed and anxious. Plan: continue trial Auvelity with Spravato She needs to get BP and pulse managed  05/08/22 TC: RTC  H Michael NA and mailbox full.  Could not leave message.  Pt  -  talked to she and H on speaker. H worried over wife.  Vacant stare.  Slurs words at times.  Not smiling. Reduced enjoyment.  Depression.  Withdrawn from usual activities.  Some irritability.  Anxious. Disc her concerns meds are making her worse.  Extensive discussion about her treatment resistant status.  There is a consistent pattern of not taking the medicines long enough to get benefit because she believes the meds are making her worse.  However the symptoms she describes as side effects are exactly the same symptoms that she had prior to taking the medication RX for  the depression.  So it is not clear that these are actual side effects. This is true about the 2 most recent meds including Seroquel and Auvelity.  Recommend psychiatric consultation in hopes of improving her comfort level with taking prescribed medications for a sufficient length of time to provide benefit. Extensive discussion about ECT is the treatment of choice for treatment resistant depression.  Spravato may work if she can comply with consistency.  There are medication options but they take longer to work.   Plan:  Reduce clonidine to 0.1 mg BID DT bradycardia.  Talk with PCP about BP and low pulse problems which are interfering with her consistent compliance with Spravato.   Limit lorazepam to 3 -4  mg daily max. Excess use is the cause of slurring speech.  She must stop excess use or will have to stop the med. Stop Auvelity per her request.  But she has only been on the full dose for a little over a week and clearly has not had time to get benefit from it.  She thinks maybe it is making her more  anxious. Reduce Seroquel from 150XR to 50 -100 mg at night  IR.  She couldn't sleep when stopped it completely. Will not start new antidepressant until her SE issues are resolved or not. Get second psych opinion from Yehuda Budd MD or another psychiatrist.  H's sister is therapist in [place], MD, [facility]  05/16/2022 appointment with the following noted: Received Spravato 84 mg today as scheduled.  Tolerated it well without nausea or vomiting headache or chest pain or palpitations.  She stopped Auvelity as discussed last week. On her own, without physician input, she restarted Wellbutrin XL 450 mg every morning today.  She had taken it in the past.  She feels jittery and anxious. She feels less depressed than she did last week.  But she is still depressed without her usual range of affect.  She still is less social and less motivated than normal. Her primary care doctor increased the dose of losartan   Past Psychiatric Medication Trials: fluoxetine, duloxetine, Viibryd, lamotrigine, Pristiq, sertraline, citalopram,  Trintellix anxious and SI Wellbutrin XL 450  Adderall, Adderall XR, Vyvanse, Ritalin, Strattera low dose NR Lorazepam Trazodone  Depakote,  lamotrigine cog complaints Lithium remotely Abilify 7.5  Vraylar 1.5 mg daily agitation and insomnia Rexulti insomnia Latuda 40 one dose, CO anxious and SI Seroquel XR 300  At visit November 12, 2019. We discussed Patient developed an increasingly severe alcohol dependence problem since her last visit in January.  She went to [facility] and has had no alcohol since then except 1 day.  She never abused stimulants but they took her off the stimulants at [facility].  Her ADD was markedly worse.  The Wellbutrin did not help the ADD.   D history lamotrigine rash at 66 yo  Review of Systems:  Review of Systems  Constitutional:  Positive for fatigue.  Cardiovascular:  Negative for chest pain and palpitations.   Musculoskeletal:  Positive for arthralgias, back pain and joint swelling.       SP hip surgery October 2020  Neurological:  Negative for dizziness.  Psychiatric/Behavioral:  Positive for decreased concentration and dysphoric mood. Negative for behavioral problems, confusion, hallucinations, self-injury, sleep disturbance and suicidal ideas. The patient is nervous/anxious. The patient is not hyperactive.     Medications: I have reviewed the patient's current medications.  Current Outpatient Medications  Medication Sig Dispense Refill   amphetamine-dextroamphetamine (ADDERALL XR) 20 MG 24 hr capsule Take 1 capsule (20 mg total) by mouth every morning. 30 capsule 0   amphetamine-dextroamphetamine (ADDERALL XR) 20 MG 24 hr capsule Take 1 capsule (20 mg total) by mouth every morning. 30 capsule 0   cloNIDine (CATAPRES) 0.2 MG tablet Take 0.5 tablets (0.1 mg total) by mouth 2 (two) times daily. 60 tablet 0   Esketamine HCl, 84 MG Dose, (SPRAVATO, 84 MG DOSE,) 28 MG/DEVICE SOPK USE 3 SPRAYS IN EACH NOSTRIL TWICE WEEKLY 3 each 5   iron polysaccharides (NIFEREX) 150 MG capsule TAKE 1 CAPSULE BY MOUTH EVERY DAY 90 capsule 1   LORazepam (ATIVAN) 1 MG tablet Take 1 tablet (1 mg total) by mouth every 6 (six) hours as needed for anxiety. 100 tablet 0   losartan (COZAAR) 50 MG tablet Take 50 mg by mouth daily.     nebivolol (BYSTOLIC) 2.5 MG tablet Take 2.5 mg by mouth daily.     QUEtiapine (SEROQUEL) 50 MG tablet Take 1 tablet (50 mg total) by mouth at bedtime. 30 tablet 0   No current facility-administered medications for this visit.    Medication Side Effects: None  Allergies:  Allergies  Allergen Reactions   Metronidazole Shortness Of Breath and Other (See Comments)    Heart pounding   Ferrlecit [Na Ferric Gluc Cplx In Sucrose] Other (See Comments)    Infusion reaction 05/12/2019    Past Medical History:  Diagnosis Date   ADHD    Anemia    Anxiety    Arthritis    Depression    Heart  murmur    i went to see a cardiologit slast eyar  and i had zero plaque,    PONV (postoperative nausea and vomiting)    Recovering alcoholic in remission ([place])     Family History  Problem Relation Age of Onset   Atrial fibrillation Mother    CAD Father     Social History   Socioeconomic History   Marital status: Married    Spouse name: Not on file   Number of children: Not on file   Years of education: Not on file   Highest education level: Not on file  Occupational History   Not on file  Tobacco Use   Smoking status: Former    Types: Cigarettes    Quit date: 08/16/2003    Years since quitting: 18.7   Smokeless tobacco: Never   Tobacco comments:     08-28-2019 "i smoked 2 cigarettes in the last month since my father  passed"  Vaping Use   Vaping Use: Never used  Substance and Sexual Activity   Alcohol use: Yes    Alcohol/week: 10.0 standard drinks of alcohol    Types: 10 Glasses of wine per week   Drug use: No   Sexual activity: Not on file  Other Topics Concern   Not on file  Social History Narrative   Not on file   Social Determinants of Health   Financial Resource Strain: Not on file  Food Insecurity: Not on file  Transportation Needs: Not on file  Physical Activity: Not on file  Stress: Not on file  Social Connections: Not on file  Intimate Partner Violence: Not on file    Past Medical History, Surgical history, Social history, and Family history were reviewed and updated as appropriate.   Please see review of systems for further details on the patient's review from today.   Objective:   Physical Exam:  There were no vitals taken for this visit.  Physical Exam Constitutional:      General: She is not in acute distress. Neurological:     Mental Status: She is alert and oriented to person, place, and time.     Coordination: Coordination normal.     Gait: Gait normal.  Psychiatric:        Attention and Perception: Attention and perception normal.         Mood and Affect: Mood is anxious and depressed. Affect is blunt. Affect is not labile, angry, tearful or inappropriate.        Speech: Speech is not rapid and pressured or slurred.        Behavior: Behavior is not slowed.        Thought Content: Thought content is not paranoid or delusional. Thought content does not include homicidal or suicidal ideation. Thought content does not include suicidal plan.        Cognition and Memory: Cognition normal. Memory is impaired. She does not exhibit impaired recent memory.        Judgment: Judgment normal.     Comments: Insight intact. No auditory or visual hallucinations. No delusions.  Depression  ongoing. Affect with some smiling but still depressed and more blunted than usual. No Sui intent plan      Lab Review:     Component Value Date/Time   NA 137 01/12/2021 1430   NA 140 11/18/2018 1544   K 3.8 01/12/2021 1430   CL 108 01/12/2021 1430   CO2 22 01/12/2021 1430   GLUCOSE 94 01/12/2021 1430   BUN 14 01/12/2021 1430   BUN 20 11/18/2018 1544   CREATININE 0.82 01/12/2021 1430   CALCIUM 8.9 01/12/2021 1430   PROT 6.6 01/12/2021 1430   ALBUMIN 3.9 01/12/2021 1430   AST 12 (L) 01/12/2021 1430   ALT 11 01/12/2021 1430   ALKPHOS 46 01/12/2021 1430   BILITOT 0.5 01/12/2021 1430   GFRNONAA >60 01/12/2021 1430   GFRAA >60 09/02/2019 0249   GFRAA >60 01/27/2019 0811       Component Value Date/Time   WBC 4.5 01/12/2021 1430   RBC 4.32 01/12/2021 1430   HGB 12.8 01/12/2021 1430   HGB 12.9 07/17/2019 0953   HCT 38.5 01/12/2021 1430   HCT 21.9 (L) 12/25/2018 1221   PLT 272 01/12/2021 1430   PLT 286 07/17/2019 0953   MCV 89.1 01/12/2021 1430   MCH 29.6 01/12/2021 1430   MCHC 33.2 01/12/2021 1430   RDW 12.4 01/12/2021 1430   LYMPHSABS 1.4 01/12/2021 1430   MONOABS 0.4 01/12/2021 1430   EOSABS 0.0 01/12/2021 1430   BASOSABS 0.0 01/12/2021 1430    No results found for: "POCLITH", "LITHIUM"   No results found for:  "PHENYTOIN", "PHENOBARB", "VALPROATE", "CBMZ"   .res Assessment: Plan:    Recurrent major depression resistant to treatment ([place])  Generalized anxiety disorder  Accelerated hypertension  Insomnia due to mental condition  Attention deficit hyperactivity disorder (ADHD), predominantly inattentive type   She has treatment resistant major depression at this time.  Have  discussed some of her recent abnormal behaviors leading to this depressive episode getting worse which she says were associated with heavy use of delta 8 and not a manic episode.  She realizes now that that was not good for her.  She stopped all use of other drugs including those available over-the-counter such as delta 8 or any other THC related products.  She is no longer having any of those types of behaviors and instead is depressed. She remains persistently depressed with lack of interest and lack of feeling for things that normally she would have feelings about.  She has low motivation and energy.  She is sad and down.  She is less productive than usual.  Her concentration is poor.  She has high degree of anxiety as well. She has a very negative self-image and low self-esteem.  These are all uncharacteristic for her  Because of the question of cognitive issues we will check TSH, B12 and folate.  Pending She apparently has still not done these blood tests.  Blood pressure was better today so she was able to receive Spravato.  Patient was administered Spravato 84 mg intranasally today.  The patient experienced the typical dissociation which gradually resolved over the 2-hour period of observation.  There were no complications.  Specifically the patient did not have nausea or vomiting or headache.  Blood pressures remained within normal ranges at the 40-minute and 2-hour follow-up intervals.  By the time the 2-hour observation period was met the patient was alert and oriented and able to exit without assistance.  Patient feels  the Spravato administration is  helpful for the treatment resistant depression and would like to continue the treatment.  See nursing note for further details.  Failed multiple antidepressants.  Many of them were not actual failures but intolerances and it is unclear whether some of that was more connected with anxiety than true side effects.  1 example is the [place].  In general she does not want to try anything but an antidepressant but has failed all major categories of antidepressants except TCAs and MAO inhibitors which have not been tried.  Could consider a TCA but given her manic response to delta 8 there is some concern about the risk of a TCA triggering mania.  It could be combined with lithium but she generally is unfavorable about taking lithium.  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  She wants to continue Spravato.  We discussed discussed the side effects in detail as well as the protocol required to receive Spravato.  S  Has been taking Seroquel XR since 03/20/2022.   Have reduced Seroquel XR to one half of a 300 mg tablet at night with plan to discontinue it because of lack of sufficient efficacy.  Per her request she stopped Auvelity.  Advised her against making med changes on her own and she restarted Wellbutrin XL at 450 mg every morning and not unexpectedly has had excessive anxiety.  We discussed the usual dosing protocol and she needs to drop back to 150 mg daily for 3 to 4 days and if tolerated increase to 300 mg daily.  Do not change meds on your own.  Started Spravato 84 mg twice weekly on 03/16/2022.  But has missed several doses due to elevated blood pressure prior to administration  Adderall to XR 20 mg AM Clonidine i0.1 mg twice daily off label for anxiety and for blood pressure which has not been well managed lately until today. Or  until PCP changes mes.  Ok temporary Ativan 1 mg 4 times daily as needed anxiety but try to cut it back. Is not ideal to use benzodiazepine with stimulant but because of the severity of her symptoms it has been necessary.  Hope to eventually eliminate the benzodiazepine.  Expected as her depression improves her anxiety will improve as well.  However lately her anxiety has been unmanageable.  We will expect that to improve as the depression improves.  She has headed insturctions to reduce this.  PCP managing BP and it is better today  Discussed potential benefits, risks, and side effects of stimulants with patient to include increased heart rate, palpitations, insomnia, increased anxiety, increased irritability, or decreased appetite.  Instructed patient to contact office if experiencing any significant tolerability issues. She wants to return to usual dose of Adderall for ADD bc of mor poor cognitive function with reduction.  Also discussed that depression will impair cognitive function.  Discussed safety plan at length with patient.  Advised patient to contact office with any worsening signs and symptoms.  Instructed patient to go to the [facility] emergency room for evaluation if experiencing any acute safety concerns, to include suicidal intent.  Has Maintained sobriety  FU with Spravato twice weekly  Lynder Parents, MD, DFAPA     Please see After Visit Summary for patient specific instructions.  Future Appointments  Date Time Provider [place]  05/18/2022 11:15 AM CP-NURSE CP-CP None  05/18/2022 11:30 AM Cottle,  Billey [place]., MD CP-CP None           No orders of the defined types were placed in this encounter.      -------------------------------

## 2022-05-16 NOTE — Progress Notes (Signed)
Nurse visit:   Patient arrived for her 16 th Spravato treatment. Pt is being treated for Treatment Resistant Depression, pt will be receiving 84 mg which will continue to be her maintenance dose, she continues receiving twice weekly as long as her b/p is stable. Pt met with her PCP on Monday and medication changes were made, her doctor also left a message that her b/p was stable. Patient arrived and taken to treatment room.  Answered any questions and concerns the patient had since her last treatment which was on 05/03/22 and what to expect today. Confirmed she had a ride home which is her husband would be coming back to pick up pt when done.  Pt's Spravato is ordered through local [facility] and delivered to office, all Spravato medication is stored at doctors office per REMS/FDA guidelines. The medication is required to be locked behind two doors per FDA/REMS Protocol. Medication is also disposed of properly per regulations.      Began taking patient's vital signs at 12:05 PM 139/87, pulse 49. Instructed patient to blow her nose then recline back to a 45 degree angle. Gave patient first dose 28 mg nasal spray, patient is still trying to get the hang of using the nasal sprays, each nasal spray administered in each nostril as directed and waited 5 minutes between the second and third dose. All 3 doses given pt did not complain of any nausea/vomiting, given a cup of water due to the taste after the administration of Spravato.  She listens to Pand[employer] with spa or relaxing music.  Checked 40 minute vitals at 12:45 PM, 98/54, pulse was 62. Explained she would be monitored for a total time of 120 minutes. Discharge vitals were taken at 2:00  PM 115/70 P 52. Dr. Cottle, did come visit with patient and discuss how treatment was going.  I walked pt to elevator, where her husband met her for the ride home. Recommend she go home and sleep or just relax on the couch. No driving, no intense activities. Verbalized  understanding. Nurse was with pt a total of 70 minutes for clinical assessment. Pt is scheduled to come back again this Friday, July 7th. Pt instructed to call office with any problems or questions.      LOT [id number] EXP APRIL 2025 

## 2022-05-16 NOTE — Progress Notes (Signed)
Nurse visit:   Patient arrived for her 26 th Spravato treatment. Pt is being treated for Treatment Resistant Depression, pt will be receiving 84 mg which will continue to be her maintenance dose, she continues receiving twice weekly as long as her b/p is stable..Pt's blood pressure at home this morning was not stable and she had to wait till getting better readings. Patient arrived and taken to treatment room.  Answered any questions and concerns the patient had since her last treatment and what to expect today. Confirmed she had a ride home which is her husband would be coming back to pick up pt when done.  Pt's Spravato is ordered through [facility] and delivered to office, all Spravato medication is stored at doctors office per REMS/FDA guidelines. The medication is required to be locked behind two doors per FDA/REMS Protocol. Medication is also disposed of properly per regulations.      Began taking patient's vital signs at 1:35 PM 109/68, pulse 73. Instructed patient to blow her nose then recline back to a 45 degree angle. Gave patient first dose 28 mg nasal spray, patient is still trying to get the hang of using the nasal sprays, each nasal spray administered in each nostril as directed and waited 5 minutes between the second and third dose. Pt seems very sedate since arriving, she is in and out with sleeping. I think her Clonidine she took at home is sedating her, she couldn't hardly self administer her nasal sprays. After all 3 doses given pt did not complain of any nausea/vomiting, given a cup of water due to the taste after the administration of Spravato. Pt remains in and out of sedation, she is experiencing dissociation. She listens to Pand[employer] with spa or relaxing music.  Checked 40 minute vitals at 2:25 PM, 166/98, pulse was 43. Pt's vitals have jumped considerably since starting her treatment. Will continue to monitor and notify Dr. Clovis Pu of pt's condition. Explained she would be  monitored for a total time of 120 minutes. Discharge vitals were taken at 3:35  PM 182/102, P 40. Dr. Clovis Pu, did come visit with patient and discuss how treatment was going and now her B/P elevated and pulse low. Pt denies any chest pain/discomfort, no symptoms reported. I rechecked her vitals at 3:55 PM 174/102, pulse 40. Dr. Clovis Pu continued to talk to pt and inform her we could not continue her treatments until her B/P and Pulse are stable. Rechecked vitals once more before she left at 4:10 PM 184/100, pulse 39.  I walked pt to elevator, where her husband met her for the ride home. Recommend she go home and sleep or just relax on the couch. No driving, no intense activities. Verbalized understanding. Nurse was with pt a total of 70 minutes for clinical assessment. Pt is not scheduled until cleared with her PCP. Pt instructed to call office with any problems or questions.      LOT [id number] EXP APRIL 2025

## 2022-05-17 ENCOUNTER — Other Ambulatory Visit: Payer: Self-pay | Admitting: Psychiatry

## 2022-05-18 ENCOUNTER — Ambulatory Visit (INDEPENDENT_AMBULATORY_CARE_PROVIDER_SITE_OTHER): Payer: 59 | Admitting: Physician Assistant

## 2022-05-18 ENCOUNTER — Ambulatory Visit: Payer: 59

## 2022-05-18 VITALS — BP 124/76 | HR 50

## 2022-05-18 DIAGNOSIS — F339 Major depressive disorder, recurrent, unspecified: Secondary | ICD-10-CM

## 2022-05-18 DIAGNOSIS — F411 Generalized anxiety disorder: Secondary | ICD-10-CM | POA: Diagnosis not present

## 2022-05-18 DIAGNOSIS — F900 Attention-deficit hyperactivity disorder, predominantly inattentive type: Secondary | ICD-10-CM | POA: Diagnosis not present

## 2022-05-18 NOTE — Progress Notes (Signed)
Nurse visit:   Patient arrived for her 17th Spravato treatment. Pt is being treated for Treatment Resistant Depression, pt will be receiving 84 mg which will continue to be her maintenance dose, she continues receiving twice weekly as long as her b/p is stable.  Patient arrived and taken to treatment room.  Confirmed she had a ride home which is her husband would be coming back to pick up pt when done.  Pt's Spravato is ordered through [facility] and delivered to office, all Spravato medication is stored at doctors office per REMS/FDA guidelines. The medication is required to be locked behind two doors per FDA/REMS Protocol. Medication is also disposed of properly per regulations.      Began taking patient's vital signs at 11:50 AM 91/54, pulse 58. Instructed patient to blow her nose then recline back to a 45 degree angle. Gave patient first dose 28 mg nasal spray, patient is still trying to get the hang of using the nasal sprays, each nasal spray administered in each nostril as directed and waited 5 minutes between the second and third dose. All 3 doses given pt did not complain of any nausea/vomiting, given a cup of water due to the taste after the administration of Spravato.  She listens to [employer] with spa or relaxing music.  Checked 40 minute vitals at 12:30 PM, 92/54, pulse was 51. Explained she would be monitored for a total time of 120 minutes. Discharge vitals were taken at 2:00  PM 124/76 P 50. Pt very anxious after thoughts clearer asking about her treatments and how come she's not feeling better, reassured pt with her questions. Donnal Moat, PA-C did come visit with patient and discuss how treatment was going.  I walked pt to elevator, where her husband met her for the ride home. Recommend she go home and sleep or just relax on the couch. No driving, no intense activities. Verbalized understanding. Nurse was with pt a total of 70 minutes for clinical assessment. Pt is scheduled to come  back again on Monday, July 10th. Pt instructed to call office with any problems or questions.      LOT [id number] EXP APRIL 2025

## 2022-05-19 ENCOUNTER — Encounter: Payer: Self-pay | Admitting: Physician Assistant

## 2022-05-19 NOTE — Progress Notes (Signed)
[facility]  Patient ID: Laura Chang,  MRN: [id number]  PCP: Gwyndolyn Kaufman, PA-C  Date of Evaluation: 05/18/2022 Time spent:40 minutes  Chief Complaint:  Chief Complaint   Other     HISTORY/CURRENT STATUS: HPI For Spravato.  Last txment 2 days ago. Not noticing any improvement yet, but she had to skip a week or so d/t HTN issues. That's better controlled. Losartan was increased in past few weeks. Concerned that she'll never get better, and Spravato isn't working. Concerned that she had 'bad thoughts' during this 2hour period. Was more sad. Still not enjoying things like she wants. Going to the beach for fm vacation in a few weeks. Kind of dreads it. Energy and motivation are fair to good, depends on day. Less social, decreased energy/motivation. Appetite ok. Sleeps ok. Not isolating. States that attention is good without easy distractibility.  Able to focus on things and finish tasks to completion.  No SI/HI.  Patient denies increased energy with decreased need for sleep, no increased talkativeness, no racing thoughts, no impulsivity or risky behaviors, no increased spending, no increased libido, no grandiosity, no increased irritability or anger, and no hallucinations.  Review of Systems  Constitutional:  Positive for malaise/fatigue.  HENT: Negative.    Eyes: Negative.   Respiratory: Negative.    Cardiovascular: Negative.   Gastrointestinal: Negative.   Genitourinary: Negative.   Musculoskeletal: Negative.   Skin: Negative.   Neurological: Negative.   Endo/Heme/Allergies: Negative.   Psychiatric/Behavioral:         See HPI    Individual Medical History/ Review of Systems: Changes? :No   fluoxetine, duloxetine, Viibryd, lamotrigine, Pristiq, sertraline, citalopram,  Trintellix anxious and SI Wellbutrin XL 450   Adderall, Adderall XR, Vyvanse, Ritalin, Strattera low dose NR Lorazepam Trazodone   Depakote,  lamotrigine cog complaints Lithium  remotely Abilify 7.5  Vraylar 1.5 mg daily agitation and insomnia Rexulti insomnia Latuda 40 one dose, CO anxious and SI Seroquel XR 300  Allergies: Metronidazole and Ferrlecit [na ferric gluc cplx in sucrose]  Current Medications:  Current Outpatient Medications:    amphetamine-dextroamphetamine (ADDERALL XR) 20 MG 24 hr capsule, Take 1 capsule (20 mg total) by mouth every morning., Disp: 30 capsule, Rfl: 0   amphetamine-dextroamphetamine (ADDERALL XR) 20 MG 24 hr capsule, Take 1 capsule (20 mg total) by mouth every morning., Disp: 30 capsule, Rfl: 0   cloNIDine (CATAPRES) 0.2 MG tablet, Take 0.5 tablets (0.1 mg total) by mouth 2 (two) times daily., Disp: 60 tablet, Rfl: 0   Esketamine HCl, 84 MG Dose, (SPRAVATO, 84 MG DOSE,) 28 MG/DEVICE SOPK, USE 3 SPRAYS IN EACH NOSTRIL TWICE WEEKLY, Disp: 3 each, Rfl: 5   iron polysaccharides (NIFEREX) 150 MG capsule, TAKE 1 CAPSULE BY MOUTH EVERY DAY, Disp: 90 capsule, Rfl: 1   LORazepam (ATIVAN) 1 MG tablet, Take 1 tablet (1 mg total) by mouth every 6 (six) hours as needed for anxiety., Disp: 100 tablet, Rfl: 0   losartan (COZAAR) 50 MG tablet, Take 50 mg by mouth daily., Disp: , Rfl:    nebivolol (BYSTOLIC) 2.5 MG tablet, Take 2.5 mg by mouth daily., Disp: , Rfl:    QUEtiapine (SEROQUEL) 50 MG tablet, Take 1 tablet (50 mg total) by mouth at bedtime., Disp: 30 tablet, Rfl: 0 Medication Side Effects: none  Family Medical/ Social History: Changes? No  MENTAL HEALTH EXAM:  There were no vitals taken for this visit.There is no height or weight on file to calculate BMI.  General Appearance:  Casual and Well Groomed  Eye Contact:  Good  Speech:  Clear and Coherent and Normal Rate  Volume:  Normal  Mood:   sad  Affect:  Congruent  Thought Process:  Goal Directed and Descriptions of Associations: Circumstantial  Orientation:  Full (Time, Place, and Person)  Thought Content: Logical   Suicidal Thoughts:  No  Homicidal Thoughts:  No  Memory:  WNL   Judgement:  Good  Insight:  Good  Psychomotor Activity:  Normal  Concentration:  Concentration: Good  Recall:  Good  Fund of Knowledge: Good  Language: Good  Assets:  Desire for Improvement Financial Resources/Insurance Housing Transportation Vocational/Educational  ADL's:  Intact  Cognition: WNL  Prognosis:  Good   Patient was administered Spravato 84 mg intranasally today.  The patient experienced the typical dissociation which gradually resolved over the 2-hour period of observation.  There were no complications.  Specifically the patient did not have nausea or vomiting or headache.  Blood pressures remained within normal ranges at the 40-minute and 2-hour follow-up intervals.  By the time the 2-hour observation period was met the patient was alert and oriented and able to exit without assistance.  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  See nursing note for further details.   DIAGNOSES:    ICD-10-CM   1. Recurrent major depression resistant to treatment ([place])  F33.9     2. Generalized anxiety disorder  F41.1     3. Attention deficit hyperactivity disorder (ADHD), predominantly inattentive type  F90.0       Receiving Psychotherapy: No    RECOMMENDATIONS:  PDMP reviewed. Spravato filled 05/17/2022. Ativan filled 05/02/2022. I provided 40  minutes of face to face time during this encounter, including time spent before and after the visit in records review, medical decision making, counseling pertinent to today's visit, and charting.   Resassured her that the dark thoughts she experienced today during the 2 hour observation period don't mean the treatment isn't working. Because of her medical issues and that she's has to skip some doses d/t that, she isn't as much better as hoped at this stage of treatment. Hopefully now that BP is better and she can get tx on schedule without concerns of  HTN, she'll start to respond quickly.  Continue Adderall XR, 1 q am. Continue Clonidine 0.2 mg, 1 qhs. Continue Spravato 84 mg twice/wk. Continue Ativan 1 mg, 1 tid prn. Continue Seroquel 50 mg, 1 qhs.  Return next wk.  Donnal Moat, PA-C

## 2022-05-21 ENCOUNTER — Ambulatory Visit: Payer: 59

## 2022-05-21 ENCOUNTER — Ambulatory Visit (INDEPENDENT_AMBULATORY_CARE_PROVIDER_SITE_OTHER): Payer: 59 | Admitting: Physician Assistant

## 2022-05-21 ENCOUNTER — Encounter: Payer: Self-pay | Admitting: Physician Assistant

## 2022-05-21 VITALS — BP 125/81 | HR 49

## 2022-05-21 DIAGNOSIS — F900 Attention-deficit hyperactivity disorder, predominantly inattentive type: Secondary | ICD-10-CM

## 2022-05-21 DIAGNOSIS — F339 Major depressive disorder, recurrent, unspecified: Secondary | ICD-10-CM | POA: Diagnosis not present

## 2022-05-21 DIAGNOSIS — F411 Generalized anxiety disorder: Secondary | ICD-10-CM

## 2022-05-21 DIAGNOSIS — F3130 Bipolar disorder, current episode depressed, mild or moderate severity, unspecified: Secondary | ICD-10-CM

## 2022-05-21 NOTE — Progress Notes (Signed)
Nurse visit:   Patient arrived for her 48 th Spravato treatment. Pt is being treated for Treatment Resistant Depression, pt will be receiving 84 mg which will continue to be her maintenance dose, she continues receiving twice weekly as long as her b/p is stable.  Patient arrived and taken to treatment room.  Confirmed she had a ride home which is her husband would be coming back to pick up pt when done.  Pt's Spravato is ordered through [employer] and delivered to office, all Spravato medication is stored at doctors office per REMS/FDA guidelines. The medication is required to be locked behind two doors per FDA/REMS Protocol. Medication is also disposed of properly per regulations.      Began taking patient's vital signs at 9:45 AM 120/69, pulse 45. Instructed patient to blow her nose then recline back to a 45 degree angle. Gave patient first dose 28 mg nasal spray, patient is still trying to get the hang of using the nasal sprays, each nasal spray administered in each nostril as directed and waited 5 minutes between the second and third dose. All 3 doses given pt did not complain of any nausea/vomiting, given a cup of water due to the taste after the administration of Spravato.  She listens to [employer] with spa or relaxing music.  Checked 40 minute vitals at 10:35 AM, 131/81, pulse was 47. Explained she would be monitored for a total time of 120 minutes. Discharge vitals were taken at 11:55  AM 125/81 P 49. Pt felt the first half of her treatment she felt wonderful but then it started to wear off, informed her that was normal.Teresa Hurst, PA-C did her visit  and discussed  how treatment was going.  I walked pt to elevator, where her husband met her for the ride home. Recommend she go home and sleep or just relax on the couch. No driving, no intense activities. Verbalized understanding. Nurse was with pt a total of 70 minutes for clinical assessment. Pt is scheduled to come back again on Wednesday,  July 12th. Pt instructed to call office with any problems or questions.      LOT [id number] EXP APRIL 2025

## 2022-05-21 NOTE — Progress Notes (Signed)
[facility]  Patient ID: Laura Chang,  MRN: [id number]  PCP: Gwyndolyn Kaufman, PA-C  Date of Evaluation: 710/2023 Time spent:40 minutes  Chief Complaint:  Chief Complaint   Depression; Other     HISTORY/CURRENT STATUS: HPI For Spravato.  Presents for [employer] treatment.  States she is discouraged because she is not feeling better.  She has had to skip several treatments because of hypertension, today is her third treatment being back at twice weekly schedule.  States she felt really good, almost euphoric for the first hour or so after today's treatment but then got depressed again.  That was very discouraging.  Did not have "dark thoughts" like she did at the treatment on 05/18/2022.  Still does not feel like doing much of anything.  No motivation, hard time focusing on things.  Ask if Adderall would be appropriate to add back in, she feels like it would be helpful.  Extreme loss of interest.  Weary of life but not suicidal.  Personal hygiene is normal.  Does not have a lot of anxiety.  No homicidal thoughts.  Patient denies increased energy with decreased need for sleep, no increased talkativeness, no racing thoughts, no impulsivity or risky behaviors, no increased spending, no increased libido, no grandiosity, no increased irritability or anger, and no hallucinations.  Review of Systems  Constitutional:  Positive for malaise/fatigue.  HENT: Negative.    Eyes: Negative.   Respiratory: Negative.    Cardiovascular: Negative.   Gastrointestinal: Negative.   Genitourinary: Negative.   Musculoskeletal: Negative.   Skin: Negative.   Neurological: Negative.   Endo/Heme/Allergies: Negative.   Psychiatric/Behavioral:         See HPI    Individual Medical History/ Review of Systems: Changes? :No   fluoxetine, duloxetine, Viibryd, lamotrigine, Pristiq, sertraline, citalopram,  Trintellix anxious and SI Wellbutrin XL 450   Adderall, Adderall XR, Vyvanse, Ritalin,  Strattera low dose NR Lorazepam Trazodone   Depakote,  lamotrigine cog complaints Lithium remotely Abilify 7.5  Vraylar 1.5 mg daily agitation and insomnia Rexulti insomnia Latuda 40 one dose, CO anxious and SI Seroquel XR 300  Allergies: Metronidazole and Ferrlecit [na ferric gluc cplx in sucrose]  Current Medications:  Current Outpatient Medications:    buPROPion HCl (WELLBUTRIN XL PO), Take by mouth., Disp: , Rfl:    cloNIDine (CATAPRES) 0.2 MG tablet, Take 0.5 tablets (0.1 mg total) by mouth 2 (two) times daily., Disp: 60 tablet, Rfl: 0   Esketamine HCl, 84 MG Dose, (SPRAVATO, 84 MG DOSE,) 28 MG/DEVICE SOPK, USE 3 SPRAYS IN EACH NOSTRIL TWICE WEEKLY, Disp: 3 each, Rfl: 5   iron polysaccharides (NIFEREX) 150 MG capsule, TAKE 1 CAPSULE BY MOUTH EVERY DAY, Disp: 90 capsule, Rfl: 1   LORazepam (ATIVAN) 1 MG tablet, Take 1 tablet (1 mg total) by mouth every 6 (six) hours as needed for anxiety., Disp: 100 tablet, Rfl: 0   losartan (COZAAR) 50 MG tablet, Take 50 mg by mouth daily., Disp: , Rfl:    nebivolol (BYSTOLIC) 2.5 MG tablet, Take 2.5 mg by mouth daily., Disp: , Rfl:    QUEtiapine (SEROQUEL) 50 MG tablet, Take 1 tablet (50 mg total) by mouth at bedtime., Disp: 30 tablet, Rfl: 0   amphetamine-dextroamphetamine (ADDERALL XR) 20 MG 24 hr capsule, Take 1 capsule (20 mg total) by mouth every morning. (Patient not taking: Reported on 05/21/2022), Disp: 30 capsule, Rfl: 0   amphetamine-dextroamphetamine (ADDERALL XR) 20 MG 24 hr capsule, Take 1 capsule (20 mg total) by  mouth every morning. (Patient not taking: Reported on 05/21/2022), Disp: 30 capsule, Rfl: 0 Medication Side Effects: none  Family Medical/ Social History: Changes? No  MENTAL HEALTH EXAM:  There were no vitals taken for this visit.There is no height or weight on file to calculate BMI.  General Appearance: Casual and Well Groomed  Eye Contact:  Good  Speech:  Clear and Coherent and Normal Rate  Volume:  Normal  Mood:   Depressed  Affect:  Congruent  Thought Process:  Goal Directed and Descriptions of Associations: Circumstantial  Orientation:  Full (Time, Place, and Person)  Thought Content: Logical   Suicidal Thoughts:  No  Homicidal Thoughts:  No  Memory:  WNL  Judgement:  Good  Insight:  Good  Psychomotor Activity:  Normal  Concentration:  Concentration: Good  Recall:  Good  Fund of Knowledge: Good  Language: Good  Assets:  Desire for Improvement Financial Resources/Insurance Housing Transportation Vocational/Educational  ADL's:  Intact  Cognition: WNL  Prognosis:  Good   Patient was administered Spravato 84 mg intranasally today.  The patient experienced the typical dissociation which gradually resolved over the 2-hour period of observation.  There were no complications.  Specifically the patient did not have nausea or vomiting or headache.  Denies dizziness, presyncope, or syncope.  Blood pressures remained within normal ranges at the 40-minute and 2-hour follow-up intervals.  Her pulse has been consistently low without complications.  By the time the 2-hour observation period was met the patient was alert and oriented and able to exit without assistance.  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  See nursing note for further details.   See MADRS on chart  DIAGNOSES:    ICD-10-CM   1. Recurrent major depression resistant to treatment ([place])  F33.9     2. Generalized anxiety disorder  F41.1     3. Attention deficit hyperactivity disorder (ADHD), predominantly inattentive type  F90.0     4. Bipolar I disorder, most recent episode depressed ([place])  F31.30       Receiving Psychotherapy: No    RECOMMENDATIONS:  PDMP reviewed. Spravato filled 05/17/2022. Ativan filled 05/02/2022. I provided 40 minutes of face to face time during this encounter, including time spent before and after the visit in  records review, medical decision making, counseling pertinent to today's visit, and charting.   Tried to encourage her that just because she is not feeling better during the observation.  It does not mean the Spravato is ineffective. She asked whether she should stay on the Wellbutrin or go back to [place], and whether she can have Adderall again, would really like to have it before the weekend if she can so she can pack for her trip to the beach next week.  States is not the end of the world if she cannot have it but if possible it would be nice.  We will discuss with Dr. Clovis Pu and I will either see her in 2 days for her treatment or he will if back in the office, and we can address then.  Holding Adderall XR due to hypertension. Continue Wellbutrin, questionable dose Continue Clonidine 0.2 mg, 1 qhs. Continue Spravato 84 mg twice/wk. Continue Ativan 1 mg, 1 tid prn. Continue Seroquel 50 mg, 1 qhs.  Return in 2 days  Donnal Moat, PA-C

## 2022-05-23 ENCOUNTER — Ambulatory Visit (INDEPENDENT_AMBULATORY_CARE_PROVIDER_SITE_OTHER): Payer: 59 | Admitting: Psychiatry

## 2022-05-23 ENCOUNTER — Ambulatory Visit: Payer: 59

## 2022-05-23 ENCOUNTER — Other Ambulatory Visit: Payer: Self-pay | Admitting: Psychiatry

## 2022-05-23 VITALS — BP 134/84 | HR 54

## 2022-05-23 DIAGNOSIS — F339 Major depressive disorder, recurrent, unspecified: Secondary | ICD-10-CM

## 2022-05-23 DIAGNOSIS — F900 Attention-deficit hyperactivity disorder, predominantly inattentive type: Secondary | ICD-10-CM | POA: Diagnosis not present

## 2022-05-23 DIAGNOSIS — F5105 Insomnia due to other mental disorder: Secondary | ICD-10-CM | POA: Diagnosis not present

## 2022-05-23 DIAGNOSIS — F411 Generalized anxiety disorder: Secondary | ICD-10-CM | POA: Diagnosis not present

## 2022-05-23 MED ORDER — AMPHETAMINE-DEXTROAMPHET ER 20 MG PO CP24: 20.0000 mg | ORAL_CAPSULE | ORAL | 0 refills | Status: DC

## 2022-05-23 MED ORDER — LORAZEPAM 1 MG PO TABS: 1.0000 mg | ORAL_TABLET | Freq: Four times a day (QID) | ORAL | 0 refills | Status: DC | PRN

## 2022-05-23 MED ORDER — BUPROPION HCL ER (XL) 300 MG PO TB24: 300.0000 mg | ORAL_TABLET | Freq: Every day | ORAL | 0 refills | Status: DC

## 2022-05-23 NOTE — Telephone Encounter (Signed)
Please send

## 2022-05-23 NOTE — Progress Notes (Signed)
Laura Chang [id number] 04/30/1956 66 y.o.  Subjective:   Patient ID:  Laura Chang is a 66 y.o. (DOB [birth year]) female.  Chief Complaint:  Chief Complaint  Patient presents with   Follow-up   Depression   ADD   Anxiety     HPI Laura Chang presents to the office today for follow-up of depression and anxiety and ADD.  seen November 12, 2019.  Melted down in 2020.  Went to [employer] in July.  No withdrawal.  1 drink since then.  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 She was on no stimulant and no SSRI but was taking Strattera and Wellbutrin.  The following changes were made. Stop Strattera. OK restart stimulant bc severe ADD Restart Adderall 1 daily for a few days and if tolerated then restart 1 twice daily. If not tolerated reduce the dosage if needed. May need to stop Wellbutrin if not tolerating the stimulant.  Yes.  DC Wellbutrin Restart Prozac 20 mg daily.  February 2021 appointment with the following noted: Completed grant proposal.  Couldn't doit without Adderall.  Sold a bunch of work.   Adderall XR lasts about 3 pm.  Strength seems about right.  BP been OK.  Not jittery.   Stopped Wellbutrin but had no SE. Mood drastically better with grant proposal and back on fluoxetine.  Less depressed and lethargic.  No anxiety.  Cut back on coffee. Started back with devotions and stronger faith. Plan: Continue Prozac 20 mg daily. May have to increase the dose at some point in the future given that she usually was taking higher dosages but she is getting good response at this time. Restart Wellbutrin off label for ADD since can't get 2 ADDERALL daily. 150 mg daily then 300 mg daily. She can adjust the dose between 150 mg and 300 mg daily to get the optimal effect.   05/11/2020 appointment with the following noted: Has been inconsistent with Prozac and Wellbutrin. Not sure of the effect of Wellbutrin. Biggest deterrent in work is anxiety.   Some of the work is conceptual and difficult at times.  Can feel she's not up to a project at times.  Overall is OK but would like a steadier benefit from stimulant.  Exhausted from managing concentration and keeping up with things from the day.  Loses things.  Not good keeping up with schedule. Overall productive and emotionally OK. Can feel Adderall wear off. Mood is better in summer and worse in the winter.   F died in 10/12/23 and that is a loss. No SE Wellbutrin. Still attends AA meetings.  Real benefit from [place] last year. Recognizes effect of anemia on ADD and mood.  Had iron infusions last winter. Plan:  Wellbutrin off label for ADD since can't get 2 ADDERALL daily. 150 mg daily then 300 mg daily.  01/24/2021 appointment with following noted: Doing a program called [employer] mindfulness app since Xmas.  CBT app helped the depression.  App helped her focus better.  Lost sign weight. Writing a lot. Before Xmas felt depressed and started negative thinking worse, self denigrating. Not drinking. More isolated.   Recognizes mo is narcissist.    Didn't tell anyone she was born until 3 mos later.  M aloof and uninterested in pt.  Lied about her birthday.  Mo lack of affection even with pt's kids. Going to [place] for a year and it helped her to quit drinking. Also misses kids being gone  with a hole also. Plan: No med changes  05/04/2021 appointment with the following noted: Therapist [name] thinks she's manic. Lost weight to 144#.   States she is still sleeping okay.  Admits she is hyper and recognizes that she is likely manic.  She feels great, euphoric with an increased sense of spiritual connectedness to God.  She has racing thoughts and talks fast and talks a lot and this is noted by her husband.  He thinks she is a bit hyper.  She has been able to maintain sobriety although she will have 1 glass of wine on special occasions but does not drink by herself.  She is not drinking to  excess.  She denies any dangerous impulsivity.  She is clearly not depressed and not particularly anxious.  She has no concerns about her medication and she has been compliant.  06/16/21 appt noted: So much better.  Going through a lot but the manic thing happened on top of it.  So much slower.  Didn't feel like losing anything with risperidone.  Likes the Adderalll at 10 mg. Some drowsiness in the AM and very drowsy from risperidone 2 mg HS. Prayer life is better. Handling stress better. Less depressed with risperidone. Still likes trazodone. Sleeps well. Plan: Reduce Prozac to 10 mg daily.  Consider stopping it because it can feel the mania however she is reluctant to do that because she fears relapse of depression. Reduce risperidone to 1.5 mg nightly due to side effects.  Discussed risk of worsening mania.  07/25/2021 appointment with the following noted: Misses the Adderall and hard to function without it. Depressed now. Heavy chest.  Anxious and guilty.  Body feels heavy.   Hates Wellbutrin.   Plan: Increase fluoxetine to 20 mg daily Add Abilify 1/2 of 15 mg tablet daily Wean wellbutrin by 1 tablet each week  bc she feels it is not helpful and DT polypharmacy Reduce risperidone to 1 daily for 1 week and stop it. Disc risk of mania. Increase Adderall to XR 20 mg AM  08/08/21 Much less depressed and starting to feel normal I feel a lot better. No SE.  Speech normal off risperidone. Sleeping OK on trazaodone and enough.   Noticed benefit from Adderall again. Plan: continue fluoxetine to 20 mg daily Continue Abilify 1/2 of 15 mg tablet daily for depression and mania continue Adderall to XR 20 mg AM  10/10/2021 phone call: Pt stated she feels like the Abilify should be decreased to 23m.She said she is depressed but rational and not suicidal.She has an appt Monday and can wait until then if you prefer. MD response: Reduce the Abilify to 7.5 mg every other day.  We will meet on 10/16/2021  and decide what to do from there.  10/16/2021 appointment with the following noted: More depressed.  Most depressed I've ever been.  Just numb.  Sense of grief.   Thinks the manic episode was unlike anything else she ever had.  Doesn't want to medicate against it.  Don't enjoy people.  Easily overwhelmed.  Had some death thoughts but not suicidal.  Has been functional.  Feels better today after reducing Abilify to every other day but she is only been doing that for 3 days. A/P: Episode of post manic depression was explained. continue fluoxetine to 20 mg daily Hold Abilify for 1 week then resume Abilify 1/2 of 15 mg tablet every other day for depression and mania continue Adderall to XR 20 mg AM  10/27/2021  appointment with the following noted: I'm doing so much better.  Handling the depression better. Better self talk and spiritual focus has helped.   Dep 6/10 manifesting as anxiety with low confidence.   F died 2  years ago and M 66 yo and is dependent . She is working hard to feel better but still feels depressed.  She almost feels like she has a little more anxiety since restarting Abilify every other day. Plan: continue fluoxetine to 20 mg daily DC Abilify .  Vrayalar 1.5 mg QOD to try to get rid of depression ASAP. continue Adderall to XR 20 mg AM  11/10/2021 appointment with the following noted: Busy with Xmas and it was fun with family but then a big let down.  Did well with it.  Functioned well with it.  Working hard on things with depression.  Not shutting down. Not sure but feels better today but yesterday was hard.  Difficulty dealing with mother.  She won't do anything to help herself.  Yesterday with her all day.  Won't do PT and has isolated herself.    Lack of confidence.   No SE with Vraylar.  11/24/21 urgent appointment appt noted: More and more depressed.   So anxious and doesn't want to be alone but can do so. No appetite. Hurts inside. Has had some fleeting suicidal  thoughts but would not act on them.  Tolerating meds. Has been consistent with Vraylar 1.5 mg every other day, fluoxetine 20 mg daily Plan: Increase Vraylar to 1.5 mg daily Change Prozac to Trintellix 10 mg daily. Discussed side effects of each continue Adderall to XR 20 mg AM  12/27/2021 appointment with the following noted: Not OK.  I feel less depressed but feels bat shit. Not sleeping well.  Extremely anxious. Off and on sleep. 3-4 hours of sleep.   Still having daily SI.  But also become obvious has so much to do.  Overwhelmed by tasks.   Needs anxiety meds to just function. Not more motivated.  Walked yesterday.   Feels afraid like in trouble but not irritable or angry. DC DT agitation Vraylar to 1.5 mg daily Change Prozac to Trintellix 10 mg daily. Hold Adderall to XR 20 mg AM Clonidine 0.1 1/2 tablet twice daily for 2 days and if needed for anxiety and sleep increase to 1 twice daily Ok temporary Ativan 1 mg 3 times daily as needed anxiety  01/05/22 appt noted: Off fluoxetine and  Trintellix.  Only on Ativan, trazodone and Adderall XR 20 plus added clonidine 0.1 mg BID Didn't think she needed to start Trintellix. Not taking Ativan.   Didn't like herself last week. Feels some better today. Wonders if the manic sx Not agitated.  Anxiety kind of calmed down.  A lot to be anxious about situationally.  $ stress. Concerns about downers with meds. Can't access normal personality. ? Lethargy and inability to talk as sE. Plan: Latuda 20-40 mg daily with food. Adderall to XR 20 mg AM Clonidine 0.1 1/2 tablet twice daily  reduce dose to be sure no SE Ok temporary Ativan 1 mg 3 times daily as needed anxiety  01/19/22 appt noted: Taking Latuda 20 mg daily.  Took 40 mg once and felt anxious and  SI Still depressed and not very reactive Anxiety mainly about the depression and fears of the future. She wants to revisit manic sx and thinks it was maybe bc taking delta 8 bc was taking a lot  of it so still  doesn't think she's classic bipolar. She wants to only take Prozac bc thinks Latuda is perpetuating depression. Says the delta 8 was very psychaedelic.  When not taking it was not manic.  Sleeping ok again.  Plan: Per her request DC Latuda 20-40 mg daily with food. She wants to continue Prozac alone AMA  Adderall to XR 20 mg AM Clonidine 0.1 1/2 tablet twice daily  reduce dose to be sure no SE Ok temporary Ativan 1 mg 3 times daily as needed anxiety  01/23/2022 phone call complaining of increased anxiety since stopping Latuda.  She will try increasing clonidine.  01/26/2022 phone call not feeling well and wanted to restart the Vraylar.  However notes indicate that had made her agitated therefore she was encouraged to pick up samples of Rexulti 1 mg and start that instead.  02/06/2022 phone call: Stating she felt the Rexulti was helping with depression but she was not sleeping well and obsessing over things.  She was encouraged to increase Rexulti to 2 mg daily and increase trazodone for sleep.  02/09/2022 appointment with the following noted: This was an urgent work in appointment No sleep last night with trazodone 100 mg HS Nothing really better depression or anxiety. Ruminating negative anxious thoughts. Did not tolerate Rexulti because it was causing insomnia.  Does not think it helped depression.  Lacks emotion that she should have.  Lacks her usual personality.  Some hopeless thoughts.  Some death thoughts.  Some suicidal thoughts without plan or intent Plan: DC Rexulti and Prozac & DC trazodone Adderall to XR 20 mg AM Clonidine 0.1 1/tablet twice daily  reduce dose to be sure no SE Ok temporary Ativan 1 mg 3 times daily as needed anxiety Start Seroquel XR 150 mg nightly  03/02/2022 appointment: Langley Gauss called back a few days after starting Seroquel stating it was making her more anxious and more depressed.  This seemed unlikely as this medicine rarely ever causes anxiety.   She stopped the medication waited 3 days and called back still had anxiety and depression but thought perhaps the anxiety was a little better.  She did not want to take the Seroquel. She knew about the option of Spravato and wanted to pursue that. Now questions whether to return to Seroquel while waiting to start Spravato bc feels just as bad without it and knows she didn't give it enough time to work.   MADRS 46  ECT-MADRS    Flowsheet Row Clinical Support from 05/21/2022 in [place] Visit from 03/02/2022 in [facility]  MADRS Total Score 27 46      03/14/22 appt noted: Pt received Spravato 56 mg first dose today with some dissociative sx which were not severe.  She was anxious prior to the administration and felt better after receiving lorazepam 1 mg.  No NV, or HA. Wants to continue Spravato. Ongoing depression and desperate to feel better.  I'm not myself DT deprsssion which is most severe in recent history.  Anhedonia.  Low motivation.  Social avoidance. Continues to think all recent med trials are making her worse.  Sleep ok with Seroquel.  03/16/22 appt noted: Received Spravato 84 mg for the first time.  some dissociative sx which were not severe.  She was anxious prior to the administration and felt better after receiving lorazepam 1 mg.  No NV, or HA. Wants to continue Spravato.   Does not feel any better or different since the last appt.  Ongoing depression.  Ongoing  depression and desperate to feel better.  I'm not myself DT deprsssion which is most severe in recent history.  Anhedonia.  Low motivation.  Social avoidance. Continues to think all recent med trials are making her worse.  Sleep ok with Seroquel.  Does not want to continue Seroquel for TRD.  03/20/2022 appointment noted: Came for Spravato administration today.  However blood pressure was significantly elevated approximately 180/115.  She was given lorazepam 1 mg and clonidine 0.2 mg  to try to get it down. She states she regretted stopping the Seroquel XR 300 mg tablets.  She now realizes it was helpful.  She did not sleep much at all last night.  She did not take the Adderall this morning. 2 to 3 hours after arrival blood pressure was still elevated at  170/110, 62 pulse.  For Spravato administration was canceled for today.  She admits to being anxious and depressed.  She is not suicidal.  She is highly motivated to receive the Spravato.  We discussed getting it tomorrow.  03/22/2022 appointment noted: Patient's blood pressure was never stable enough yesterday in order to get her in for Spravato administration.  She was encouraged to see her primary care doctor.  It is better today.  03/26/2022 appointment with the following noted: Blood pressure was better.  Saw her primary care doctor who started on oral Bystolic 2.5 mg daily. Received Spravato 84 mg today as scheduled.  Tolerated it well without nausea or vomiting headache or chest pain or palpitations.  Her blood pressure was borderline but manageable. She remains depressed and anxious.  She is ambivalent about the medicine and desperate to get to feel better.  Continues to have anhedonia and low energy and low motivation and reduced ability to do things.  Less social.  Not suicidal.  03/28/22 appt noted: Received Spravato 84 mg today as scheduled.  Tolerated it well without nausea or vomiting headache or chest pain or palpitations.  Her blood pressure was borderline but manageable. Has not seen any improvement so far.  Tolerating Seroquel.  Inconsistent with Bystolic and BP has been borderline high. Still depressed and anxious and anhedonia.  Low motivation, energy, productivity. Taking quetiapine and tolerating XR 300 mg nightly.  04/04/22 appt noted: Received Spravato 84 mg today as scheduled.  Tolerated it well without nausea or vomiting headache or chest pain or palpitations.  Her blood pressure was borderline but  manageable. Has not seen any improvement so far.  Tolerating Seroquel.   She still tends to think that the medications are making her worse.  She has said this about each of the recent psychiatric medicines including Seroquel.  However her husband thinks she is improved.  She also admits there is some improvement in productivity.  She still feels highly anxious.  She still does not enjoy things as normal.  She still feels desperate to improve as soon as possible. Has been taking Seroquel XR since 03/20/2022  04/10/22 appt noted: Received Spravato 84 mg today as scheduled.  Tolerated it well without nausea or vomiting headache or chest pain or palpitations.  Her blood pressure was borderline but manageable. Has not seen any improvement so far.  Tolerating Seroquel.  Doesn't like Seroquel bc she thinks it flattens here. 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  Tolerated it well without nausea or vomiting headache or chest pain or palpitations.  Her blood pressure was borderline but manageable. Has not seen any improvement so far.  Tolerating Seroquel.  Doesn't like Seroquel bc she thinks it flattens her. Received Spravato 84 mg today as scheduled.  Tolerated it well without nausea or vomiting headache or chest pain or palpitations.  Her blood pressure was borderline but manageable. Has not seen any improvement so far.  Tolerating Seroquel.  Doesn't like Seroquel bc she thinks it flattens here.  We discussed her ambivalence about it. She is starting Auvelity and has tolerated it the last 2 days without side effect.  She still does not feel like herself and feels flat and not enjoying things with suppressed expressed emotion  04/17/2022 appointment with the following noted: Received Spravato 84 mg today as scheduled.  Tolerated it  well without nausea or vomiting headache or chest pain or palpitations.  Her blood pressure was borderline but manageable. Has not seen any improvement so far.  Tolerating Seroquel.  Doesn't like Seroquel bc she thinks it flattens her. She has been tolerating the Auvelity 1 in the morning without side effects for about a week.  She has not noticed significant improvement so far.  She still feels depressed and flat and not herself.  Other people notice that she is flat emotionally.  She is not suicidal.  She does feel discouraged that she is not getting better yet.  04/19/2022 appointment noted: Has increased Auvelity to 1 twice daily for 2 days, continues quetiapine XR 300 mg nightly, clonidine 0.3 mg twice daily, lorazepam 1 mg twice daily for anxiety and Adderall XR 20 mg in the morning. No obious SE but she still thinks quetiapine XR is making her feel down.  But not sedated Received Spravato 84 mg today as scheduled.  Tolerated it well without nausea or vomiting headache or chest pain or palpitations.  Her blood pressure was borderline but manageable. She still feels quite anxious and feels it necessary to take both the clonidine and lorazepam twice a day to manage her anxiety.  She has been consistently down and flat and not herself until yesterday afternoon she noted an improvement in mood and feeling much more like herself with her normal personality reemerging.  She was quite depressed in the morning with very dark negative thoughts.  She did not have those dark negative thoughts this morning.  She had a lot of questions about medication and when she was expecting to be improved and why she has not shown improvement up to now.  04/23/22 appt noted: Has increased Auvelity to 1 twice daily for 1 week, continues quetiapine XR 300 mg nightly, clonidine 0.3 mg twice daily, lorazepam 1 mg twice daily for anxiety and Adderall XR 20 mg in the morning. No obious SE but she still thinks quetiapine XR is  making her feel down.  But not sedated Received Spravato 84 mg today as scheduled.  Tolerated it well without nausea or vomiting headache or chest pain or palpitations.  She is still depressed but admits better function and is able to enjoy social interactions. Tolerating meds.  Would like to feel better for sure. Not herself.  Flat. Plan increase Auvelity to 1 tab BID as planned and reduce Quetiapine to 1/2 of ER 300 mg  bc NR for depression.  04/25/2022 appointment with the following noted: clonidine 0.3 mg twice daily, lorazepam 1 mg twice daily for anxiety and Adderall XR 20 mg in the morning. Seroquel XR 300 HS No obious SE but she still thinks quetiapine  XR is making her feel down.  But not sedated Received Spravato 84 mg today as scheduled.  Tolerated it well without nausea or vomiting headache or chest pain or palpitations.  Called yesterday with more anxiety.  Had increased Auvelity for 1 day and reduced Seroquel XR for 1 day.  Felt restless and fearful  05/01/2022 appointment noted: clonidine 0.3 mg twice daily, lorazepam 1 mg twice daily for anxiety and Adderall XR 20 mg in the morning. Seroquel XR 150 HS, Auvelity 1 BID Received Spravato 84 mg today as scheduled.  Tolerated it well without nausea or vomiting headache or chest pain or palpitations.  Nurse has noted patient has called multiple times sometimes asking the same question repeatedly.  It is unclear whether she is truly forgetful or is just anxious seeking reassurance. Patient acknowledges ongoing depression as well as some anxiety but states she has felt a little better in the last couple of days.  She has reduced the Seroquel to 150 mg at night and has increased Auvelity to 1 twice daily but only for 1 day.  So far she seems to be tolerating it.  05/03/22 appt noted: clonidine 0.2 mg twice daily, lorazepam 1 mg twice daily for anxiety and Adderall XR 20 mg in the morning. Seroquel XR 150 HS, Auvelity 1 BID BP high this am  about 170/100 and received extra clonidine 0.2 mg and came to receive Spravato.  Not dizzy, no SOB, nor CP but BP is still high Could not receive Spravato today bc BP high and pulse low at 30 ppm. Still depressed and anxious. Plan: continue trial Auvelity with Spravato She needs to get BP and pulse managed  05/08/22 TC: RTC  H Michael NA and mailbox full.  Could not leave message.  Pt  -  talked to she and H on speaker. H worried over wife.  Vacant stare.  Slurs words at times.  Not smiling. Reduced enjoyment.  Depression.  Withdrawn from usual activities.  Some irritability.  Anxious. Disc her concerns meds are making her worse.  Extensive discussion about her treatment resistant status.  There is a consistent pattern of not taking the medicines long enough to get benefit because she believes the meds are making her worse.  However the symptoms she describes as side effects are exactly the same symptoms that she had prior to taking the medication RX for  the depression.  So it is not clear that these are actual side effects. This is true about the 2 most recent meds including Seroquel and Auvelity.  Recommend psychiatric consultation in hopes of improving her comfort level with taking prescribed medications for a sufficient length of time to provide benefit. Extensive discussion about ECT is the treatment of choice for treatment resistant depression.  Spravato may work if she can comply with consistency.  There are medication options but they take longer to work.   Plan:  Reduce clonidine to 0.1 mg BID DT bradycardia.  Talk with PCP about BP and low pulse problems which are interfering with her consistent compliance with Spravato.   Limit lorazepam to 3 -4  mg daily max. Excess use is the cause of slurring speech.  She must stop excess use or will have to stop the med. Stop Auvelity per her request.  But she has only been on the full dose for a little over a week and clearly has not had time to get  benefit from it.  She thinks maybe it is making her more  anxious. Reduce Seroquel from 150XR to 50 -100 mg at night IR.  She couldn't sleep when stopped it completely. Will not start new antidepressant until her SE issues are resolved or not. Get second psych opinion from Yehuda Budd MD or another psychiatrist.  H's sister is therapist in [place] [facility], [facility], [facility]  05/16/2022 appointment with the following noted: Received Spravato 84 mg today as scheduled.  Tolerated it well without nausea or vomiting headache or chest pain or palpitations.  She stopped Auvelity as discussed last week. On her own, without physician input, she restarted Wellbutrin XL 450 mg every morning today.  She had taken it in the past.  She feels jittery and anxious. She feels less depressed than she did last week.  But she is still depressed without her usual range of affect.  She still is less social and less motivated than normal. Her primary care doctor increased the dose of losartan Plan: Stop Seroquel Reduce Wellbutrin XL to 300 mg every morning.  Starting the dose at 450 every morning is likely causing side effects of jitteriness and it should not be started at that have a dosage. Recommend she not change meds on her own without MDM put  05/23/2022 appointment with the following noted: Received Spravato 84 mg today as scheduled.  Tolerated it well without nausea or vomiting headache or chest pain or palpitations.  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  She does feel somewhat less anxious and a little less depressed at this point.  She is tolerating the medications well.  We discussed her questions about the alternative of [place] and it was recommended that she have consultation regarding that to further explore her options. However she is encouraged that she is a little more responsive emotionally and her daughter thinks her depression is somewhat better and  she is somewhat less anxious on the current treatment plan.  Past Psychiatric Medication Trials: fluoxetine, duloxetine, Viibryd, lamotrigine, Pristiq, sertraline, citalopram,  Trintellix anxious and SI Wellbutrin XL 450  Adderall, Adderall XR, Vyvanse, Ritalin, Strattera low dose NR Lorazepam Trazodone  Depakote,  lamotrigine cog complaints Lithium remotely Abilify 7.5  Vraylar 1.5 mg daily agitation and insomnia Rexulti insomnia Latuda 40 one dose, CO anxious and SI Seroquel XR 300  At visit November 12, 2019. We discussed Patient developed an increasingly severe alcohol dependence problem since her last visit in January.  She went to [facility] and has had no alcohol since then except 1 day.  She never abused stimulants but they took her off the stimulants at [facility].  Her ADD was markedly worse.  The Wellbutrin did not help the ADD.   D history lamotrigine rash at 66 yo  Review of Systems:  Review of Systems  Constitutional:  Positive for fatigue.  Cardiovascular:  Negative for chest pain and palpitations.  Musculoskeletal:  Positive for arthralgias and back pain. Negative for joint swelling.       SP hip surgery October 2020  Neurological:  Negative for dizziness.  Psychiatric/Behavioral:  Positive for decreased concentration and dysphoric mood. Negative for behavioral problems, confusion, hallucinations, self-injury, sleep disturbance and suicidal ideas. The patient is nervous/anxious. The patient is not hyperactive.     Medications: I have reviewed the patient's current medications.  Current Outpatient Medications  Medication Sig Dispense Refill   amLODipine (NORVASC) 2.5 MG tablet Take 2.5 mg by mouth daily.     amphetamine-dextroamphetamine (ADDERALL XR) 20 MG 24 hr capsule  Take 1 capsule (20 mg total) by mouth every morning. 30 capsule 0   amphetamine-dextroamphetamine (ADDERALL XR) 20 MG 24 hr capsule Take 1 capsule (20 mg total) by mouth every morning.  30 capsule 0   buPROPion (WELLBUTRIN XL) 150 MG 24 hr tablet TAKE 3 TABLETS BY MOUTH DAILY. 90 tablet 0   cloNIDine (CATAPRES) 0.2 MG tablet TAKE 1 TABLET BY MOUTH 2 TIMES DAILY. 60 tablet 1   Esketamine HCl, 84 MG Dose, (SPRAVATO, 84 MG DOSE,) 28 MG/DEVICE SOPK USE 3 SPRAYS IN EACH NOSTRIL TWICE WEEKLY 3 each 5   iron polysaccharides (NIFEREX) 150 MG capsule TAKE 1 CAPSULE BY MOUTH EVERY DAY 90 capsule 1   LORazepam (ATIVAN) 1 MG tablet Take 1 tablet (1 mg total) by mouth every 6 (six) hours as needed for anxiety. 100 tablet 0   losartan (COZAAR) 50 MG tablet Take 50 mg by mouth daily.     nebivolol (BYSTOLIC) 2.5 MG tablet Take 2.5 mg by mouth daily.     OLANZapine (ZYPREXA) 10 MG tablet TAKE 1 TABLET BY MOUTH EVERYDAY AT BEDTIME 90 tablet 0   OLANZapine (ZYPREXA) 7.5 MG tablet Take 1 tablet (7.5 mg total) by mouth at bedtime. 30 tablet 0   No current facility-administered medications for this visit.    Medication Side Effects: None  Allergies:  Allergies  Allergen Reactions   Metronidazole Shortness Of Breath and Other (See Comments)    Heart pounding   Ferrlecit [Na Ferric Gluc Cplx In Sucrose] Other (See Comments)    Infusion reaction 05/12/2019    Past Medical History:  Diagnosis Date   ADHD    Anemia    Anxiety    Arthritis    Depression    Heart murmur    i went to see a cardiologit slast eyar  and i had zero plaque,    PONV (postoperative nausea and vomiting)    Recovering alcoholic in remission ([place])     Family History  Problem Relation Age of Onset   Atrial fibrillation Mother    CAD Father     Social History   Socioeconomic History   Marital status: Married    Spouse name: Not on file   Number of children: Not on file   Years of education: Not on file   Highest education level: Not on file  Occupational History   Not on file  Tobacco Use   Smoking status: Former    Types: Cigarettes    Quit date: 08/16/2003    Years since quitting: 18.9   Smokeless  tobacco: Never   Tobacco comments:     08-28-2019 "i smoked 2 cigarettes in the last month since my father  passed"  Vaping Use   Vaping Use: Never used  Substance and Sexual Activity   Alcohol use: Yes    Alcohol/week: 10.0 standard drinks of alcohol    Types: 10 Glasses of wine per week   Drug use: No   Sexual activity: Not on file  Other Topics Concern   Not on file  Social History Narrative   Not on file   Social Determinants of Health   Financial Resource Strain: Not on file  Food Insecurity: Not on file  Transportation Needs: Not on file  Physical Activity: Not on file  Stress: Not on file  Social Connections: Not on file  Intimate Partner Violence: Not on file    Past Medical History, Surgical history, Social history, and Family history were reviewed and updated as  appropriate.   Please see review of systems for further details on the patient's review from today.   Objective:   Physical Exam:  There were no vitals taken for this visit.  Physical Exam Constitutional:      General: She is not in acute distress. Neurological:     Mental Status: She is alert and oriented to person, place, and time.     Coordination: Coordination normal.     Gait: Gait normal.  Psychiatric:        Attention and Perception: Attention and perception normal.        Mood and Affect: Mood is anxious and depressed. Affect is blunt. Affect is not labile, angry, tearful or inappropriate.        Speech: Speech is not rapid and pressured or slurred.        Behavior: Behavior is not slowed.        Thought Content: Thought content is not paranoid or delusional. Thought content does not include homicidal or suicidal ideation. Thought content does not include suicidal plan.        Cognition and Memory: Cognition normal. Memory is impaired. She does not exhibit impaired recent memory.        Judgment: Judgment normal.     Comments: Insight intact. No auditory or visual hallucinations. No  delusions.  Depression  starting to improve Affect better range No Sui intent plan      Lab Review:     Component Value Date/Time   NA 137 01/12/2021 1430   NA 140 11/18/2018 1544   K 3.8 01/12/2021 1430   CL 108 01/12/2021 1430   CO2 22 01/12/2021 1430   GLUCOSE 94 01/12/2021 1430   BUN 14 01/12/2021 1430   BUN 20 11/18/2018 1544   CREATININE 0.82 01/12/2021 1430   CALCIUM 8.9 01/12/2021 1430   PROT 6.6 01/12/2021 1430   ALBUMIN 3.9 01/12/2021 1430   AST 12 (L) 01/12/2021 1430   ALT 11 01/12/2021 1430   ALKPHOS 46 01/12/2021 1430   BILITOT 0.5 01/12/2021 1430   GFRNONAA >60 01/12/2021 1430   GFRAA >60 09/02/2019 0249   GFRAA >60 01/27/2019 0811       Component Value Date/Time   WBC 4.5 01/12/2021 1430   RBC 4.32 01/12/2021 1430   HGB 12.8 01/12/2021 1430   HGB 12.9 07/17/2019 0953   HCT 38.5 01/12/2021 1430   HCT 21.9 (L) 12/25/2018 1221   PLT 272 01/12/2021 1430   PLT 286 07/17/2019 0953   MCV 89.1 01/12/2021 1430   MCH 29.6 01/12/2021 1430   MCHC 33.2 01/12/2021 1430   RDW 12.4 01/12/2021 1430   LYMPHSABS 1.4 01/12/2021 1430   MONOABS 0.4 01/12/2021 1430   EOSABS 0.0 01/12/2021 1430   BASOSABS 0.0 01/12/2021 1430    No results found for: "POCLITH", "LITHIUM"   No results found for: "PHENYTOIN", "PHENOBARB", "VALPROATE", "CBMZ"   .res Assessment: Plan:    Recurrent major depression resistant to treatment ([place]) - Plan: DISCONTINUED: buPROPion (WELLBUTRIN XL) 300 MG 24 hr tablet  Attention deficit hyperactivity disorder (ADHD), predominantly inattentive type - Plan: amphetamine-dextroamphetamine (ADDERALL XR) 20 MG 24 hr capsule  Generalized anxiety disorder - Plan: LORazepam (ATIVAN) 1 MG tablet  Insomnia due to mental condition - Plan: LORazepam (ATIVAN) 1 MG tablet   She has treatment resistant major depression at this time.  Have  discussed some of her recent abnormal behaviors leading to this depressive episode getting worse which she says were  associated with  heavy use of delta 8 and not a manic episode.  She realizes now that that was not good for her.  She stopped all use of other drugs including those available over-the-counter such as delta 8 or any other THC related products.  She is no longer having any of those types of behaviors and instead is depressed. She remains persistently depressed with lack of interest and lack of feeling for things that normally she would have feelings about.  She has low motivation and energy.  She is sad and down.  She is less productive than usual.  Her concentration is poor.  She has high degree of anxiety as well. She has a very negative self-image and low self-esteem.  These are all uncharacteristic for her  Because of the question of cognitive issues we will check TSH, B12 and folate.  Pending She apparently has still not done these blood tests.  Blood pressure was better today so she was able to receive Spravato.  Patient was administered Spravato 84 mg intranasally today.  The patient experienced the typical dissociation which gradually resolved over the 2-hour period of observation.  There were no complications.  Specifically the patient did not have nausea or vomiting or headache.  Blood pressures remained within normal ranges at the 40-minute and 2-hour follow-up intervals.  By the time the 2-hour observation period was met the patient was alert and oriented and able to exit without assistance.  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  See nursing note for further details.  Failed multiple antidepressants.  Many of them were not actual failures but intolerances and it is unclear whether some of that was more connected with anxiety than true side effects.  1 example is the [place].  In general she does not want to try anything but an antidepressant but has failed all major categories of antidepressants except  TCAs and MAO inhibitors which have not been tried.  Could consider a TCA but given her manic response to delta 8 there is some concern about the risk of a TCA triggering mania.  It could be combined with lithium but she generally is unfavorable about taking lithium.  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  She wants to continue Spravato.  We discussed discussed the side effects in detail as well as the protocol required to receive Spravato.  Disc alternative of [place]  Switched from Seroquel to olanzapine for TRD and anxiety  INCREASEd Wellbutrin XL to 450 mg AM  Started Spravato 84 mg twice weekly on 03/16/2022.  But has missed several doses due to elevated blood pressure prior to administration  Adderall to XR 20 mg AM Clonidine i0.1 mg twice daily off label for anxiety and for blood pressure which has not been well managed lately until today. Or until PCP changes mes.  Ok temporary Ativan 1 mg 4 times daily as needed anxiety but try to cut it back. Is not ideal to use benzodiazepine with stimulant but because of the severity of her symptoms it has been necessary.  Hope to eventually eliminate the benzodiazepine.  Expected as her depression improves her anxiety will improve as well.  However lately her anxiety has been unmanageable.  We will expect that to improve as the depression improves.  She has headed insturctions to reduce  this.  PCP managing BP and it is better today  Discussed potential benefits, risks, and side effects of stimulants with patient to include increased heart rate, palpitations, insomnia, increased anxiety, increased irritability, or decreased appetite.  Instructed patient to contact office if experiencing any significant tolerability issues. She wants to return to usual dose of Adderall for ADD bc of mor poor cognitive function with  reduction.  Also discussed that depression will impair cognitive function.  Discussed safety plan at length with patient.  Advised patient to contact office with any worsening signs and symptoms.  Instructed patient to go to the [facility] emergency room for evaluation if experiencing any acute safety concerns, to include suicidal intent.  Has Maintained sobriety  FU with continued Spravato with some improvement is noted  Lynder Parents, MD, DFAPA     Please see After Visit Summary for patient specific instructions.  No future appointments.          No orders of the defined types were placed in this encounter.      -------------------------------

## 2022-05-24 ENCOUNTER — Other Ambulatory Visit: Payer: Self-pay | Admitting: Psychiatry

## 2022-05-24 DIAGNOSIS — I10 Essential (primary) hypertension: Secondary | ICD-10-CM

## 2022-05-24 DIAGNOSIS — F411 Generalized anxiety disorder: Secondary | ICD-10-CM

## 2022-05-24 NOTE — Progress Notes (Signed)
Nurse visit:   Patient arrived for her 19th Spravato treatment. Pt is being treated for Treatment Resistant Depression, pt will be receiving 84 mg which will continue to be her maintenance dose, she continues receiving twice weekly as long as her b/p is stable.  Patient arrived and taken to treatment room.  Confirmed she had a ride home which is her husband would be coming back to pick up pt when done and sometimes she needs to use [facility] Popper if he is unable to pick her up. Pt's Spravato is ordered through [employer] and delivered to office, all Spravato medication is stored at doctors office per REMS/FDA guidelines. The medication is required to be locked behind two doors per FDA/REMS Protocol. Medication is also disposed of properly per regulations.      Began taking patient's vital signs at 9:45 AM 140/81, pulse 49. Instructed patient to blow her nose then recline back to a 45 degree angle. Gave patient first dose 28 mg nasal spray, patient is still trying to get the hang of using the nasal sprays, each nasal spray administered in each nostril as directed and waited 5 minutes between the second and third dose. All 3 doses given pt did not complain of any nausea/vomiting, given a cup of water due to the taste after the administration of Spravato.  She listens to [employer] with spa or relaxing music.  Checked 40 minute vitals at 10:25 AM, 155/91, pulse was 51. Explained she would be monitored for a total time of 120 minutes. Discharge vitals were taken at 12:00  PM 134/84 P 54. Pt felt the first half of her treatment she felt wonderful but then it started to wear off, informed her that was normal. Dr. Clovis Pu was back in the office and did speak with her at the end of her treatment.  I walked pt to elevator, where her husband met her for the ride home. Recommend she go home and sleep or just relax on the couch. No driving, no intense activities. Verbalized understanding. Nurse was with pt a total of 70  minutes for clinical. Pt is not scheduled until the following week due to a vacation with her family, she will return Monday, July 17th at 9:30 AM. If something changes she will contact me. We will continue twice weekly at this time.  Pt instructed to call office with any problems or questions.      LOT [id number] EXP APRIL 2025

## 2022-06-01 ENCOUNTER — Other Ambulatory Visit: Payer: Self-pay | Admitting: Psychiatry

## 2022-06-01 DIAGNOSIS — F411 Generalized anxiety disorder: Secondary | ICD-10-CM

## 2022-06-01 DIAGNOSIS — I10 Essential (primary) hypertension: Secondary | ICD-10-CM

## 2022-06-01 MED ORDER — CLONIDINE HCL 0.2 MG PO TABS: 0.1000 mg | ORAL_TABLET | Freq: Two times a day (BID) | ORAL | 0 refills | Status: DC

## 2022-06-04 ENCOUNTER — Ambulatory Visit: Payer: 59

## 2022-06-04 ENCOUNTER — Ambulatory Visit (INDEPENDENT_AMBULATORY_CARE_PROVIDER_SITE_OTHER): Payer: 59 | Admitting: Psychiatry

## 2022-06-04 VITALS — BP 97/62 | HR 58

## 2022-06-04 DIAGNOSIS — F339 Major depressive disorder, recurrent, unspecified: Secondary | ICD-10-CM

## 2022-06-04 DIAGNOSIS — I10 Essential (primary) hypertension: Secondary | ICD-10-CM

## 2022-06-04 DIAGNOSIS — F411 Generalized anxiety disorder: Secondary | ICD-10-CM | POA: Diagnosis not present

## 2022-06-04 DIAGNOSIS — F5105 Insomnia due to other mental disorder: Secondary | ICD-10-CM

## 2022-06-04 DIAGNOSIS — F900 Attention-deficit hyperactivity disorder, predominantly inattentive type: Secondary | ICD-10-CM | POA: Diagnosis not present

## 2022-06-04 NOTE — Progress Notes (Signed)
Laura Chang [id number] 1956-05-11 66 y.o.  Subjective:   Patient ID:  Laura Chang is a 66 y.o. (DOB 03-18-1956) female.  Chief Complaint:  No chief complaint on file.    HPI Laura Chang presents to the office today for follow-up of depression and anxiety and ADD.  seen November 12, 2019.  Melted down in 2020.  Went to [employer] in July.  No withdrawal.  1 drink since then.  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 She was on no stimulant and no SSRI but was taking Strattera and Wellbutrin.  The following changes were made. Stop Strattera. OK restart stimulant bc severe ADD Restart Adderall 1 daily for a few days and if tolerated then restart 1 twice daily. If not tolerated reduce the dosage if needed. May need to stop Wellbutrin if not tolerating the stimulant.  Yes.  DC Wellbutrin Restart Prozac 20 mg daily.  February 2021 appointment with the following noted: Completed grant proposal.  Couldn't doit without Adderall.  Sold a bunch of work.   Adderall XR lasts about 3 pm.  Strength seems about right.  BP been OK.  Not jittery.   Stopped Wellbutrin but had no SE. Mood drastically better with grant proposal and back on fluoxetine.  Less depressed and lethargic.  No anxiety.  Cut back on coffee. Started back with devotions and stronger faith. Plan: Continue Prozac 20 mg daily. May have to increase the dose at some point in the future given that she usually was taking higher dosages but she is getting good response at this time. Restart Wellbutrin off label for ADD since can't get 2 ADDERALL daily. 150 mg daily then 300 mg daily. She can adjust the dose between 150 mg and 300 mg daily to get the optimal effect.   05/11/2020 appointment with the following noted: Has been inconsistent with Prozac and Wellbutrin. Not sure of the effect of Wellbutrin. Biggest deterrent in work is anxiety.  Some of the work is conceptual and difficult at times.   Can feel she's not up to a project at times.  Overall is OK but would like a steadier benefit from stimulant.  Exhausted from managing concentration and keeping up with things from the day.  Loses things.  Not good keeping up with schedule. Overall productive and emotionally OK. Can feel Adderall wear off. Mood is better in summer and worse in the winter.   F died in 2023-11-10 and that is a loss. No SE Wellbutrin. Still attends AA meetings.  Real benefit from [place] last year. Recognizes effect of anemia on ADD and mood.  Had iron infusions last winter. Plan:  Wellbutrin off label for ADD since can't get 2 ADDERALL daily. 150 mg daily then 300 mg daily.  01/24/2021 appointment with following noted: Doing a program called [employer] mindfulness app since Xmas.  CBT app helped the depression.  App helped her focus better.  Lost sign weight. Writing a lot. Before Xmas felt depressed and started negative thinking worse, self denigrating. Not drinking. More isolated.   Recognizes mo is narcissist.    Didn't tell anyone she was born until 3 mos later.  M aloof and uninterested in pt.  Lied about her birthday.  Mo lack of affection even with pt's kids. Going to [place] for a year and it helped her to quit drinking. Also misses kids being gone with a hole also. Plan: No med changes  05/04/2021 appointment with the following  noted: Therapist [name] thinks she's manic. Lost weight to 144#.   States she is still sleeping okay.  Admits she is hyper and recognizes that she is likely manic.  She feels great, euphoric with an increased sense of spiritual connectedness to God.  She has racing thoughts and talks fast and talks a lot and this is noted by her husband.  He thinks she is a bit hyper.  She has been able to maintain sobriety although she will have 1 glass of wine on special occasions but does not drink by herself.  She is not drinking to excess.  She denies any dangerous impulsivity.  She is  clearly not depressed and not particularly anxious.  She has no concerns about her medication and she has been compliant.  06/16/21 appt noted: So much better.  Going through a lot but the manic thing happened on top of it.  So much slower.  Didn't feel like losing anything with risperidone.  Likes the Adderalll at 10 mg. Some drowsiness in the AM and very drowsy from risperidone 2 mg HS. Prayer life is better. Handling stress better. Less depressed with risperidone. Still likes trazodone. Sleeps well. Plan: Reduce Prozac to 10 mg daily.  Consider stopping it because it can feel the mania however she is reluctant to do that because she fears relapse of depression. Reduce risperidone to 1.5 mg nightly due to side effects.  Discussed risk of worsening mania.  07/25/2021 appointment with the following noted: Misses the Adderall and hard to function without it. Depressed now. Heavy chest.  Anxious and guilty.  Body feels heavy.   Hates Wellbutrin.   Plan: Increase fluoxetine to 20 mg daily Add Abilify 1/2 of 15 mg tablet daily Wean wellbutrin by 1 tablet each week  bc she feels it is not helpful and DT polypharmacy Reduce risperidone to 1 daily for 1 week and stop it. Disc risk of mania. Increase Adderall to XR 20 mg AM  08/08/21 Much less depressed and starting to feel normal I feel a lot better. No SE.  Speech normal off risperidone. Sleeping OK on trazaodone and enough.   Noticed benefit from Adderall again. Plan: continue fluoxetine to 20 mg daily Continue Abilify 1/2 of 15 mg tablet daily for depression and mania continue Adderall to XR 20 mg AM  10/10/2021 phone call: Pt stated she feels like the Abilify should be decreased to 25m.She said she is depressed but rational and not suicidal.She has an appt Monday and can wait until then if you prefer. MD response: Reduce the Abilify to 7.5 mg every other day.  We will meet on 10/16/2021 and decide what to do from there.  10/16/2021  appointment with the following noted: More depressed.  Most depressed I've ever been.  Just numb.  Sense of grief.   Thinks the manic episode was unlike anything else she ever had.  Doesn't want to medicate against it.  Don't enjoy people.  Easily overwhelmed.  Had some death thoughts but not suicidal.  Has been functional.  Feels better today after reducing Abilify to every other day but she is only been doing that for 3 days. A/P: Episode of post manic depression was explained. continue fluoxetine to 20 mg daily Hold Abilify for 1 week then resume Abilify 1/2 of 15 mg tablet every other day for depression and mania continue Adderall to XR 20 mg AM  10/27/2021 appointment with the following noted: I'm doing so much better.  Handling the depression  better. Better self talk and spiritual focus has helped.   Dep 6/10 manifesting as anxiety with low confidence.   F died 2  years ago and M 65 yo and is dependent . She is working hard to feel better but still feels depressed.  She almost feels like she has a little more anxiety since restarting Abilify every other day. Plan: continue fluoxetine to 20 mg daily DC Abilify .  Vrayalar 1.5 mg QOD to try to get rid of depression ASAP. continue Adderall to XR 20 mg AM  11/10/2021 appointment with the following noted: Busy with Xmas and it was fun with family but then a big let down.  Did well with it.  Functioned well with it.  Working hard on things with depression.  Not shutting down. Not sure but feels better today but yesterday was hard.  Difficulty dealing with mother.  She won't do anything to help herself.  Yesterday with her all day.  Won't do PT and has isolated herself.    Lack of confidence.   No SE with Vraylar.  11/24/21 urgent appointment appt noted: More and more depressed.   So anxious and doesn't want to be alone but can do so. No appetite. Hurts inside. Has had some fleeting suicidal thoughts but would not act on them.  Tolerating  meds. Has been consistent with Vraylar 1.5 mg every other day, fluoxetine 20 mg daily Plan: Increase Vraylar to 1.5 mg daily Change Prozac to Trintellix 10 mg daily. Discussed side effects of each continue Adderall to XR 20 mg AM  12/27/2021 appointment with the following noted: Not OK.  I feel less depressed but feels bat shit. Not sleeping well.  Extremely anxious. Off and on sleep. 3-4 hours of sleep.   Still having daily SI.  But also become obvious has so much to do.  Overwhelmed by tasks.   Needs anxiety meds to just function. Not more motivated.  Walked yesterday.   Feels afraid like in trouble but not irritable or angry. DC DT agitation Vraylar to 1.5 mg daily Change Prozac to Trintellix 10 mg daily. Hold Adderall to XR 20 mg AM Clonidine 0.1 1/2 tablet twice daily for 2 days and if needed for anxiety and sleep increase to 1 twice daily Ok temporary Ativan 1 mg 3 times daily as needed anxiety  01/05/22 appt noted: Off fluoxetine and  Trintellix.  Only on Ativan, trazodone and Adderall XR 20 plus added clonidine 0.1 mg BID Didn't think she needed to start Trintellix. Not taking Ativan.   Didn't like herself last week. Feels some better today. Wonders if the manic sx Not agitated.  Anxiety kind of calmed down.  A lot to be anxious about situationally.  $ stress. Concerns about downers with meds. Can't access normal personality. ? Lethargy and inability to talk as sE. Plan: Latuda 20-40 mg daily with food. Adderall to XR 20 mg AM Clonidine 0.1 1/2 tablet twice daily  reduce dose to be sure no SE Ok temporary Ativan 1 mg 3 times daily as needed anxiety  01/19/22 appt noted: Taking Latuda 20 mg daily.  Took 40 mg once and felt anxious and  SI Still depressed and not very reactive Anxiety mainly about the depression and fears of the future. She wants to revisit manic sx and thinks it was maybe bc taking delta 8 bc was taking a lot of it so still doesn't think she's classic  bipolar. She wants to only take Prozac bc thinks [place]  is perpetuating depression. Says the delta 8 was very psychaedelic.  When not taking it was not manic.  Sleeping ok again.  Plan: Per her request DC Latuda 20-40 mg daily with food. She wants to continue Prozac alone AMA  Adderall to XR 20 mg AM Clonidine 0.1 1/2 tablet twice daily  reduce dose to be sure no SE Ok temporary Ativan 1 mg 3 times daily as needed anxiety  01/23/2022 phone call complaining of increased anxiety since stopping Latuda.  She will try increasing clonidine.  01/26/2022 phone call not feeling well and wanted to restart the Vraylar.  However notes indicate that had made her agitated therefore she was encouraged to pick up samples of Rexulti 1 mg and start that instead.  02/06/2022 phone call: Stating she felt the Rexulti was helping with depression but she was not sleeping well and obsessing over things.  She was encouraged to increase Rexulti to 2 mg daily and increase trazodone for sleep.  02/09/2022 appointment with the following noted: This was an urgent work in appointment No sleep last night with trazodone 100 mg HS Nothing really better depression or anxiety. Ruminating negative anxious thoughts. Did not tolerate Rexulti because it was causing insomnia.  Does not think it helped depression.  Lacks emotion that she should have.  Lacks her usual personality.  Some hopeless thoughts.  Some death thoughts.  Some suicidal thoughts without plan or intent Plan: DC Rexulti and Prozac & DC trazodone Adderall to XR 20 mg AM Clonidine 0.1 1/tablet twice daily  reduce dose to be sure no SE Ok temporary Ativan 1 mg 3 times daily as needed anxiety Start Seroquel XR 150 mg nightly  03/02/2022 appointment: Langley Gauss called back a few days after starting Seroquel stating it was making her more anxious and more depressed.  This seemed unlikely as this medicine rarely ever causes anxiety.  She stopped the medication waited 3 days  and called back still had anxiety and depression but thought perhaps the anxiety was a little better.  She did not want to take the Seroquel. She knew about the option of Spravato and wanted to pursue that. Now questions whether to return to Seroquel while waiting to start Spravato bc feels just as bad without it and knows she didn't give it enough time to work.   MADRS 46  ECT-MADRS    Flowsheet Row Clinical Support from 05/21/2022 in [facility] Visit from 03/02/2022 in [facility]  MADRS Total Score 27 46      03/14/22 appt noted: Pt received Spravato 56 mg first dose today with some dissociative sx which were not severe.  She was anxious prior to the administration and felt better after receiving lorazepam 1 mg.  No NV, or HA. Wants to continue Spravato. Ongoing depression and desperate to feel better.  I'm not myself DT deprsssion which is most severe in recent history.  Anhedonia.  Low motivation.  Social avoidance. Continues to think all recent med trials are making her worse.  Sleep ok with Seroquel.  03/16/22 appt noted: Received Spravato 84 mg for the first time.  some dissociative sx which were not severe.  She was anxious prior to the administration and felt better after receiving lorazepam 1 mg.  No NV, or HA. Wants to continue Spravato.   Does not feel any better or different since the last appt.  Ongoing depression.  Ongoing depression and desperate to feel better.  I'm not myself DT deprsssion which is  most severe in recent history.  Anhedonia.  Low motivation.  Social avoidance. Continues to think all recent med trials are making her worse.  Sleep ok with Seroquel.  Does not want to continue Seroquel for TRD.  03/20/2022 appointment noted: Came for Spravato administration today.  However blood pressure was significantly elevated approximately 180/115.  She was given lorazepam 1 mg and clonidine 0.2 mg to try to get it down. She states she  regretted stopping the Seroquel XR 300 mg tablets.  She now realizes it was helpful.  She did not sleep much at all last night.  She did not take the Adderall this morning. 2 to 3 hours after arrival blood pressure was still elevated at  170/110, 62 pulse.  For Spravato administration was canceled for today.  She admits to being anxious and depressed.  She is not suicidal.  She is highly motivated to receive the Spravato.  We discussed getting it tomorrow.  03/22/2022 appointment noted: Patient's blood pressure was never stable enough yesterday in order to get her in for Spravato administration.  She was encouraged to see her primary care doctor.  It is better today.  03/26/2022 appointment with the following noted: Blood pressure was better.  Saw her primary care doctor who started on oral Bystolic 2.5 mg daily. Received Spravato 84 mg today as scheduled.  Tolerated it well without nausea or vomiting headache or chest pain or palpitations.  Her blood pressure was borderline but manageable. She remains depressed and anxious.  She is ambivalent about the medicine and desperate to get to feel better.  Continues to have anhedonia and low energy and low motivation and reduced ability to do things.  Less social.  Not suicidal.  03/28/22 appt noted: Received Spravato 84 mg today as scheduled.  Tolerated it well without nausea or vomiting headache or chest pain or palpitations.  Her blood pressure was borderline but manageable. Has not seen any improvement so far.  Tolerating Seroquel.  Inconsistent with Bystolic and BP has been borderline high. Still depressed and anxious and anhedonia.  Low motivation, energy, productivity. Taking quetiapine and tolerating XR 300 mg nightly.  04/04/22 appt noted: Received Spravato 84 mg today as scheduled.  Tolerated it well without nausea or vomiting headache or chest pain or palpitations.  Her blood pressure was borderline but manageable. Has not seen any improvement so  far.  Tolerating Seroquel.   She still tends to think that the medications are making her worse.  She has said this about each of the recent psychiatric medicines including Seroquel.  However her husband thinks she is improved.  She also admits there is some improvement in productivity.  She still feels highly anxious.  She still does not enjoy things as normal.  She still feels desperate to improve as soon as possible. Has been taking Seroquel XR since 03/20/2022  04/10/22 appt noted: Received Spravato 84 mg today as scheduled.  Tolerated it well without nausea or vomiting headache or chest pain or palpitations.  Her blood pressure was borderline but manageable. Has not seen any improvement so far.  Tolerating Seroquel.  Doesn't like Seroquel bc she thinks it flattens here. 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  Tolerated it well without nausea or vomiting headache or chest pain or palpitations.  Her blood pressure was borderline but manageable. Has not seen any improvement so  far.  Tolerating Seroquel.  Doesn't like Seroquel bc she thinks it flattens her. Received Spravato 84 mg today as scheduled.  Tolerated it well without nausea or vomiting headache or chest pain or palpitations.  Her blood pressure was borderline but manageable. Has not seen any improvement so far.  Tolerating Seroquel.  Doesn't like Seroquel bc she thinks it flattens here.  We discussed her ambivalence about it. She is starting Auvelity and has tolerated it the last 2 days without side effect.  She still does not feel like herself and feels flat and not enjoying things with suppressed expressed emotion  04/17/2022 appointment with the following noted: Received Spravato 84 mg today as scheduled.  Tolerated it well without nausea or vomiting headache or  chest pain or palpitations.  Her blood pressure was borderline but manageable. Has not seen any improvement so far.  Tolerating Seroquel.  Doesn't like Seroquel bc she thinks it flattens her. She has been tolerating the Auvelity 1 in the morning without side effects for about a week.  She has not noticed significant improvement so far.  She still feels depressed and flat and not herself.  Other people notice that she is flat emotionally.  She is not suicidal.  She does feel discouraged that she is not getting better yet.  04/19/2022 appointment noted: Has increased Auvelity to 1 twice daily for 2 days, continues quetiapine XR 300 mg nightly, clonidine 0.3 mg twice daily, lorazepam 1 mg twice daily for anxiety and Adderall XR 20 mg in the morning. No obious SE but she still thinks quetiapine XR is making her feel down.  But not sedated Received Spravato 84 mg today as scheduled.  Tolerated it well without nausea or vomiting headache or chest pain or palpitations.  Her blood pressure was borderline but manageable. She still feels quite anxious and feels it necessary to take both the clonidine and lorazepam twice a day to manage her anxiety.  She has been consistently down and flat and not herself until yesterday afternoon she noted an improvement in mood and feeling much more like herself with her normal personality reemerging.  She was quite depressed in the morning with very dark negative thoughts.  She did not have those dark negative thoughts this morning.  She had a lot of questions about medication and when she was expecting to be improved and why she has not shown improvement up to now.  04/23/22 appt noted: Has increased Auvelity to 1 twice daily for 1 week, continues quetiapine XR 300 mg nightly, clonidine 0.3 mg twice daily, lorazepam 1 mg twice daily for anxiety and Adderall XR 20 mg in the morning. No obious SE but she still thinks quetiapine XR is making her feel down.  But not sedated Received  Spravato 84 mg today as scheduled.  Tolerated it well without nausea or vomiting headache or chest pain or palpitations.  She is still depressed but admits better function and is able to enjoy social interactions. Tolerating meds.  Would like to feel better for sure. Not herself.  Flat. Plan increase Auvelity to 1 tab BID as planned and reduce Quetiapine to 1/2 of ER 300 mg  bc NR for depression.  04/25/2022 appointment with the following noted: clonidine 0.3 mg twice daily, lorazepam 1 mg twice daily for anxiety and Adderall XR 20 mg in the morning. Seroquel XR 300 HS No obious SE but she still thinks quetiapine XR is making her feel down.  But not sedated Received Spravato 84  mg today as scheduled.  Tolerated it well without nausea or vomiting headache or chest pain or palpitations.  Called yesterday with more anxiety.  Had increased Auvelity for 1 day and reduced Seroquel XR for 1 day.  Felt restless and fearful  05/01/2022 appointment noted: clonidine 0.3 mg twice daily, lorazepam 1 mg twice daily for anxiety and Adderall XR 20 mg in the morning. Seroquel XR 150 HS, Auvelity 1 BID Received Spravato 84 mg today as scheduled.  Tolerated it well without nausea or vomiting headache or chest pain or palpitations.  Nurse has noted patient has called multiple times sometimes asking the same question repeatedly.  It is unclear whether she is truly forgetful or is just anxious seeking reassurance. Patient acknowledges ongoing depression as well as some anxiety but states she has felt a little better in the last couple of days.  She has reduced the Seroquel to 150 mg at night and has increased Auvelity to 1 twice daily but only for 1 day.  So far she seems to be tolerating it.  05/03/22 appt noted: clonidine 0.2 mg twice daily, lorazepam 1 mg twice daily for anxiety and Adderall XR 20 mg in the morning. Seroquel XR 150 HS, Auvelity 1 BID BP high this am about 170/100 and received extra clonidine 0.2 mg and  came to receive Spravato.  Not dizzy, no SOB, nor CP but BP is still high Could not receive Spravato today bc BP high and pulse low at 30 ppm. Still depressed and anxious. Plan: continue trial Auvelity with Spravato She needs to get BP and pulse managed  05/08/22 TC: RTC  H Michael NA and mailbox full.  Could not leave message.  Pt  -  talked to she and H on speaker. H worried over wife.  Vacant stare.  Slurs words at times.  Not smiling. Reduced enjoyment.  Depression.  Withdrawn from usual activities.  Some irritability.  Anxious. Disc her concerns meds are making her worse.  Extensive discussion about her treatment resistant status.  There is a consistent pattern of not taking the medicines long enough to get benefit because she believes the meds are making her worse.  However the symptoms she describes as side effects are exactly the same symptoms that she had prior to taking the medication RX for  the depression.  So it is not clear that these are actual side effects. This is true about the 2 most recent meds including Seroquel and Auvelity.  Recommend psychiatric consultation in hopes of improving her comfort level with taking prescribed medications for a sufficient length of time to provide benefit. Extensive discussion about ECT is the treatment of choice for treatment resistant depression.  Spravato may work if she can comply with consistency.  There are medication options but they take longer to work.   Plan:  Reduce clonidine to 0.1 mg BID DT bradycardia.  Talk with PCP about BP and low pulse problems which are interfering with her consistent compliance with Spravato.   Limit lorazepam to 3 -4  mg daily max. Excess use is the cause of slurring speech.  She must stop excess use or will have to stop the med. Stop Auvelity per her request.  But she has only been on the full dose for a little over a week and clearly has not had time to get benefit from it.  She thinks maybe it is making her  more anxious. Reduce Seroquel from 150XR to 50 -100 mg at night IR.  She couldn't sleep when stopped it completely. Will not start new antidepressant until her SE issues are resolved or not. Get second psych opinion from Yehuda Budd MD or another psychiatrist.  H's sister is therapist in [place], MD, [facility]  05/16/2022 appointment with the following noted: Received Spravato 84 mg today as scheduled.  Tolerated it well without nausea or vomiting headache or chest pain or palpitations.  She stopped Auvelity as discussed last week. On her own, without physician input, she restarted Wellbutrin XL 450 mg every morning today.  She had taken it in the past.  She feels jittery and anxious. She feels less depressed than she did last week.  But she is still depressed without her usual range of affect.  She still is less social and less motivated than normal. Her primary care doctor increased the dose of losartan Plan: Stop Seroquel Reduce Wellbutrin XL to 300 mg every morning.  Starting the dose at 450 every morning is likely causing side effects of jitteriness and it should not be started at that have a dosage. Recommend she not change meds on her own without MDM put  05/23/2022 appointment with the following noted:   Past Psychiatric Medication Trials: fluoxetine, duloxetine, Viibryd, lamotrigine, Pristiq, sertraline, citalopram,  Trintellix anxious and SI Wellbutrin XL 450  Adderall, Adderall XR, Vyvanse, Ritalin, Strattera low dose NR Lorazepam Trazodone  Depakote,  lamotrigine cog complaints Lithium remotely Abilify 7.5  Vraylar 1.5 mg daily agitation and insomnia Rexulti insomnia Latuda 40 one dose, CO anxious and SI Seroquel XR 300  At visit November 12, 2019. We discussed Patient developed an increasingly severe alcohol dependence problem since her last visit in January.  She went to [facility] and has had no alcohol since then except 1 day.  She never abused  stimulants but they took her off the stimulants at [facility].  Her ADD was markedly worse.  The Wellbutrin did not help the ADD.   D history lamotrigine rash at 66 yo  Review of Systems:  Review of Systems  Constitutional:  Positive for fatigue.  Cardiovascular:  Negative for chest pain and palpitations.  Musculoskeletal:  Positive for arthralgias, back pain and joint swelling.       SP hip surgery October 2020  Neurological:  Negative for dizziness.  Psychiatric/Behavioral:  Positive for decreased concentration and dysphoric mood. Negative for behavioral problems, confusion, hallucinations, self-injury, sleep disturbance and suicidal ideas. The patient is nervous/anxious. The patient is not hyperactive.     Medications: I have reviewed the patient's current medications.  Current Outpatient Medications  Medication Sig Dispense Refill  . amphetamine-dextroamphetamine (ADDERALL XR) 20 MG 24 hr capsule Take 1 capsule (20 mg total) by mouth every morning. (Patient not taking: Reported on 05/21/2022) 30 capsule 0  . amphetamine-dextroamphetamine (ADDERALL XR) 20 MG 24 hr capsule Take 1 capsule (20 mg total) by mouth every morning. 30 capsule 0  . buPROPion (WELLBUTRIN XL) 300 MG 24 hr tablet Take 1 tablet (300 mg total) by mouth daily. 30 tablet 0  . cloNIDine (CATAPRES) 0.2 MG tablet Take 0.5 tablets (0.1 mg total) by mouth 2 (two) times daily. 30 tablet 0  . Esketamine HCl, 84 MG Dose, (SPRAVATO, 84 MG DOSE,) 28 MG/DEVICE SOPK USE 3 SPRAYS IN EACH NOSTRIL TWICE A WEEK 3 each 4  . iron polysaccharides (NIFEREX) 150 MG capsule TAKE 1 CAPSULE BY MOUTH EVERY DAY 90 capsule 1  . LORazepam (ATIVAN) 1 MG tablet Take 1 tablet (1 mg total)  by mouth every 6 (six) hours as needed for anxiety. 100 tablet 0  . losartan (COZAAR) 50 MG tablet Take 50 mg by mouth daily.    . nebivolol (BYSTOLIC) 2.5 MG tablet Take 2.5 mg by mouth daily.    . QUEtiapine (SEROQUEL) 50 MG tablet Take 1 tablet (50 mg total)  by mouth at bedtime. 30 tablet 0   No current facility-administered medications for this visit.    Medication Side Effects: None  Allergies:  Allergies  Allergen Reactions  . Metronidazole Shortness Of Breath and Other (See Comments)    Heart pounding  . Ferrlecit [Na Ferric Gluc Cplx In Sucrose] Other (See Comments)    Infusion reaction 05/12/2019    Past Medical History:  Diagnosis Date  . ADHD   . Anemia   . Anxiety   . Arthritis   . Depression   . Heart murmur    i went to see a cardiologit slast eyar  and i had zero plaque,   . PONV (postoperative nausea and vomiting)   . Recovering alcoholic in remission [facility])     Family History  Problem Relation Age of Onset  . Atrial fibrillation Mother   . CAD Father     Social History   Socioeconomic History  . Marital status: Married    Spouse name: Not on file  . Number of children: Not on file  . Years of education: Not on file  . Highest education level: Not on file  Occupational History  . Not on file  Tobacco Use  . Smoking status: Former    Types: Cigarettes    Quit date: 08/16/2003    Years since quitting: 18.8  . Smokeless tobacco: Never  . Tobacco comments:     08-28-2019 "i smoked 2 cigarettes in the last month since my father  passed"  Vaping Use  . Vaping Use: Never used  Substance and Sexual Activity  . Alcohol use: Yes    Alcohol/week: 10.0 standard drinks of alcohol    Types: 10 Glasses of wine per week  . Drug use: No  . Sexual activity: Not on file  Other Topics Concern  . Not on file  Social History Narrative  . Not on file   Social Determinants of Health   Financial Resource Strain: Not on file  Food Insecurity: Not on file  Transportation Needs: Not on file  Physical Activity: Not on file  Stress: Not on file  Social Connections: Not on file  Intimate Partner Violence: Not on file    Past Medical History, Surgical history, Social history, and Family history were reviewed and  updated as appropriate.   Please see review of systems for further details on the patient's review from today.   Objective:   Physical Exam:  There were no vitals taken for this visit.  Physical Exam Constitutional:      General: She is not in acute distress. Neurological:     Mental Status: She is alert and oriented to person, place, and time.     Coordination: Coordination normal.     Gait: Gait normal.  Psychiatric:        Attention and Perception: Attention and perception normal.        Mood and Affect: Mood is anxious and depressed. Affect is blunt. Affect is not labile, angry, tearful or inappropriate.        Speech: Speech is not rapid and pressured or slurred.        Behavior: Behavior  is not slowed.        Thought Content: Thought content is not paranoid or delusional. Thought content does not include homicidal or suicidal ideation. Thought content does not include suicidal plan.        Cognition and Memory: Cognition normal. Memory is impaired. She does not exhibit impaired recent memory.        Judgment: Judgment normal.     Comments: Insight intact. No auditory or visual hallucinations. No delusions.  Depression  ongoing. Affect with some smiling but still depressed and more blunted than usual. No Sui intent plan     Lab Review:     Component Value Date/Time   NA 137 01/12/2021 1430   NA 140 11/18/2018 1544   K 3.8 01/12/2021 1430   CL 108 01/12/2021 1430   CO2 22 01/12/2021 1430   GLUCOSE 94 01/12/2021 1430   BUN 14 01/12/2021 1430   BUN 20 11/18/2018 1544   CREATININE 0.82 01/12/2021 1430   CALCIUM 8.9 01/12/2021 1430   PROT 6.6 01/12/2021 1430   ALBUMIN 3.9 01/12/2021 1430   AST 12 (L) 01/12/2021 1430   ALT 11 01/12/2021 1430   ALKPHOS 46 01/12/2021 1430   BILITOT 0.5 01/12/2021 1430   GFRNONAA >60 01/12/2021 1430   GFRAA >60 09/02/2019 0249   GFRAA >60 01/27/2019 0811       Component Value Date/Time   WBC 4.5 01/12/2021 1430   RBC 4.32  01/12/2021 1430   HGB 12.8 01/12/2021 1430   HGB 12.9 07/17/2019 0953   HCT 38.5 01/12/2021 1430   HCT 21.9 (L) 12/25/2018 1221   PLT 272 01/12/2021 1430   PLT 286 07/17/2019 0953   MCV 89.1 01/12/2021 1430   MCH 29.6 01/12/2021 1430   MCHC 33.2 01/12/2021 1430   RDW 12.4 01/12/2021 1430   LYMPHSABS 1.4 01/12/2021 1430   MONOABS 0.4 01/12/2021 1430   EOSABS 0.0 01/12/2021 1430   BASOSABS 0.0 01/12/2021 1430    No results found for: "POCLITH", "LITHIUM"   No results found for: "PHENYTOIN", "PHENOBARB", "VALPROATE", "CBMZ"   .res Assessment: Plan:    No diagnosis found.   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  Have  discussed some of her recent abnormal behaviors leading to this depressive episode getting worse which she says were associated with heavy use of delta 8 and not a manic episode.  She realizes now that that was not good for her.  She stopped all use of other drugs including those available over-the-counter such as delta 8 or any other THC related products.  She is no longer having any of those types of behaviors and instead is depressed. She remains persistently depressed with lack of interest and lack of feeling for things that normally she would have feelings about.  She has low motivation and energy.  She is sad and down.  She is less productive than usual.  Her concentration is poor.  She has high degree of anxiety as well. She has a very negative self-image and low self-esteem.  These are all uncharacteristic for her  Because of the question of cognitive issues we will check TSH, B12 and folate.  Pending She apparently has still not done these blood tests.  Blood pressure was better today so she was able to receive Spravato.  Patient was administered Spravato 84 mg intranasally today.  The patient experienced the typical dissociation which gradually resolved over the 2-hour period of observation.  There were no complications.  Specifically the  patient did not have nausea or vomiting or headache.  Blood pressures remained within normal ranges at the 40-minute and 2-hour follow-up intervals.  By the time the 2-hour observation period was met the patient was alert and oriented and able to exit without assistance.  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  See nursing note for further details.  Failed multiple antidepressants.  Many of them were not actual failures but intolerances and it is unclear whether some of that was more connected with anxiety than true side effects.  1 example is the [place].  In general she does not want to try anything but an antidepressant but has failed all major categories of antidepressants except TCAs and MAO inhibitors which have not been tried.  Could consider a TCA but given her manic response to delta 8 there is some concern about the risk of a TCA triggering mania.  It could be combined with lithium but she generally is unfavorable about taking lithium.  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  She wants to continue Spravato.  We discussed discussed the side effects in detail as well as the protocol required to receive Spravato.  S  Has been taking Seroquel XR since 03/20/2022.   Have reduced Seroquel XR to one half of a 300 mg tablet at night with plan to discontinue it because of lack of sufficient efficacy.  INCREASE Wellbutrin XL to usual dose 300 mg AM  Started Spravato 84 mg twice weekly on 03/16/2022.  But has missed several doses due to elevated blood pressure prior to administration  Adderall to XR 20 mg AM Clonidine i0.1 mg twice daily off label for anxiety and for blood pressure which has not been well managed lately until today. Or until PCP changes mes.  Ok  temporary Ativan 1 mg 4 times daily as needed anxiety but try to cut it back. Is not ideal to use benzodiazepine with stimulant but because of the severity of her symptoms it has been necessary.  Hope to eventually eliminate the benzodiazepine.  Expected as her depression improves her anxiety will improve as well.  However lately her anxiety has been unmanageable.  We will expect that to improve as the depression improves.  She has headed insturctions to reduce this.  PCP managing BP and it is better today  Discussed potential benefits, risks, and side effects of stimulants with patient to include increased heart rate, palpitations, insomnia, increased anxiety, increased irritability, or decreased appetite.  Instructed patient to contact office if experiencing any significant tolerability issues. She wants to return to usual dose of Adderall for ADD bc of mor poor cognitive function with reduction.  Also discussed that depression will impair cognitive function.  Discussed safety plan at length with patient.  Advised patient to contact office with any worsening signs and symptoms.  Instructed patient to go to the [facility] emergency room for evaluation if experiencing any acute safety concerns, to include suicidal intent.  Has Maintained sobriety  FU with Spravato twice weekly  Lynder Parents, MD, DFAPA     Please see After Visit Summary for patient specific instructions.  Future Appointments  Date Time Provider [place]  06/06/2022  9:30 AM Cottle, Billey Co., MD CP-CP None  06/06/2022  9:30 AM CP-NURSE [facility]  None           No orders of the defined types were placed in this encounter.      -------------------------------

## 2022-06-04 NOTE — Progress Notes (Signed)
Nurse visit:   Patient arrived for her 27 th Spravato treatment. Pt is being treated for Treatment Resistant Depression, pt will be receiving 84 mg which will continue to be her maintenance dose, she continues receiving twice weekly as long as her b/p is stable.  Patient arrived and taken to treatment room. Pt was asked to bring her medications today for Dr. Clovis Pu to review and she did bring htose with her.  Confirmed she had a ride home which is her husband would be coming back to pick up pt when done and sometimes she needs to use [facility] Popper if he is unable to pick her up. Pt's Spravato is ordered through [employer] and delivered to office, all Spravato medication is stored at doctors office per REMS/FDA guidelines. The medication is required to be locked behind two doors per FDA/REMS Protocol. Medication is also disposed of properly per regulations.      Began taking patient's vital signs at 9:35 AM 140/88, pulse 49. Instructed patient to blow her nose then recline back to a 45 degree angle. Gave patient first dose 28 mg nasal spray, patient is still trying to get the hang of using the nasal sprays, each nasal spray administered in each nostril as directed and waited 5 minutes between the second and third dose. All 3 doses given pt did not complain of any nausea/vomiting, given a cup of water due to the taste after the administration of Spravato.  She listens to [employer] with spa or relaxing music.  Checked 40 minute vitals at 10:15 AM, 108/66, pulse was 53. Explained she would be monitored for a total time of 120 minutes. Discharge vitals were taken at 11:40  AM 97/62 P 58. Dr. Clovis Pu met with her and discussed her medication and made a couple changes.   I walked pt to elevator, where her husband met her for the ride home. Recommend she go home and sleep or just relax on the couch. No driving, no intense activities. Verbalized understanding. Nurse was with pt a total of 70 minutes for clinical. Pt is  coming back in on Wednesday, July 26th. We will continue twice weekly at this time.  Pt instructed to call office with any problems or questions.      LOT [id number] EXP [id number]

## 2022-06-05 ENCOUNTER — Encounter: Payer: Self-pay | Admitting: Psychiatry

## 2022-06-05 MED ORDER — BUPROPION HCL ER (XL) 150 MG PO TB24: 450.0000 mg | ORAL_TABLET | Freq: Every day | ORAL | 0 refills | Status: DC

## 2022-06-06 ENCOUNTER — Ambulatory Visit: Payer: 59

## 2022-06-06 ENCOUNTER — Ambulatory Visit (INDEPENDENT_AMBULATORY_CARE_PROVIDER_SITE_OTHER): Payer: 59 | Admitting: Psychiatry

## 2022-06-06 VITALS — BP 102/56 | HR 57

## 2022-06-06 DIAGNOSIS — F339 Major depressive disorder, recurrent, unspecified: Secondary | ICD-10-CM

## 2022-06-06 DIAGNOSIS — F411 Generalized anxiety disorder: Secondary | ICD-10-CM | POA: Diagnosis not present

## 2022-06-06 DIAGNOSIS — I10 Essential (primary) hypertension: Secondary | ICD-10-CM

## 2022-06-06 DIAGNOSIS — F5105 Insomnia due to other mental disorder: Secondary | ICD-10-CM | POA: Diagnosis not present

## 2022-06-06 DIAGNOSIS — F900 Attention-deficit hyperactivity disorder, predominantly inattentive type: Secondary | ICD-10-CM

## 2022-06-06 NOTE — Progress Notes (Addendum)
Laura Chang [id number] 1955-11-28 66 y.o.  Subjective:   Patient ID:  Laura Chang is a 66 y.o. (DOB 1956/01/31) female.  Chief Complaint:  Chief Complaint  Patient presents with   Follow-up   Depression   Anxiety   ADD     HPI Laura Chang presents to the office today for follow-up of depression and anxiety and ADD.  seen November 12, 2019.  Melted down in 2020.  Went to [employer] in July.  No withdrawal.  1 drink since then.  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 She was on no stimulant and no SSRI but was taking Strattera and Wellbutrin.  The following changes were made. Stop Strattera. OK restart stimulant bc severe ADD Restart Adderall 1 daily for a few days and if tolerated then restart 1 twice daily. If not tolerated reduce the dosage if needed. May need to stop Wellbutrin if not tolerating the stimulant.  Yes.  DC Wellbutrin Restart Prozac 20 mg daily.  February 2021 appointment with the following noted: Completed grant proposal.  Couldn't doit without Adderall.  Sold a bunch of work.   Adderall XR lasts about 3 pm.  Strength seems about right.  BP been OK.  Not jittery.   Stopped Wellbutrin but had no SE. Mood drastically better with grant proposal and back on fluoxetine.  Less depressed and lethargic.  No anxiety.  Cut back on coffee. Started back with devotions and stronger faith. Plan: Continue Prozac 20 mg daily. May have to increase the dose at some point in the future given that she usually was taking higher dosages but she is getting good response at this time. Restart Wellbutrin off label for ADD since can't get 2 ADDERALL daily. 150 mg daily then 300 mg daily. She can adjust the dose between 150 mg and 300 mg daily to get the optimal effect.   05/11/2020 appointment with the following noted: Has been inconsistent with Prozac and Wellbutrin. Not sure of the effect of Wellbutrin. Biggest deterrent in work is anxiety.   Some of the work is conceptual and difficult at times.  Can feel she's not up to a project at times.  Overall is OK but would like a steadier benefit from stimulant.  Exhausted from managing concentration and keeping up with things from the day.  Loses things.  Not good keeping up with schedule. Overall productive and emotionally OK. Can feel Adderall wear off. Mood is better in summer and worse in the winter.   F died in September 25, 2023 and that is a loss. No SE Wellbutrin. Still attends AA meetings.  Real benefit from [place] last year. Recognizes effect of anemia on ADD and mood.  Had iron infusions last winter. Plan:  Wellbutrin off label for ADD since can't get 2 ADDERALL daily. 150 mg daily then 300 mg daily.  01/24/2021 appointment with following noted: Doing a program called [employer] mindfulness app since Xmas.  CBT app helped the depression.  App helped her focus better.  Lost sign weight. Writing a lot. Before Xmas felt depressed and started negative thinking worse, self denigrating. Not drinking. More isolated.   Recognizes mo is narcissist.    Didn't tell anyone she was born until 3 mos later.  M aloof and uninterested in pt.  Lied about her birthday.  Mo lack of affection even with pt's kids. Going to [place] for a year and it helped her to quit drinking. Also misses kids being gone  with a hole also. Plan: No med changes  05/04/2021 appointment with the following noted: Therapist [name] thinks she's manic. Lost weight to 144#.   States she is still sleeping okay.  Admits she is hyper and recognizes that she is likely manic.  She feels great, euphoric with an increased sense of spiritual connectedness to God.  She has racing thoughts and talks fast and talks a lot and this is noted by her husband.  He thinks she is a bit hyper.  She has been able to maintain sobriety although she will have 1 glass of wine on special occasions but does not drink by herself.  She is not drinking to  excess.  She denies any dangerous impulsivity.  She is clearly not depressed and not particularly anxious.  She has no concerns about her medication and she has been compliant.  06/16/21 appt noted: So much better.  Going through a lot but the manic thing happened on top of it.  So much slower.  Didn't feel like losing anything with risperidone.  Likes the Adderalll at 10 mg. Some drowsiness in the AM and very drowsy from risperidone 2 mg HS. Prayer life is better. Handling stress better. Less depressed with risperidone. Still likes trazodone. Sleeps well. Plan: Reduce Prozac to 10 mg daily.  Consider stopping it because it can feel the mania however she is reluctant to do that because she fears relapse of depression. Reduce risperidone to 1.5 mg nightly due to side effects.  Discussed risk of worsening mania.  07/25/2021 appointment with the following noted: Misses the Adderall and hard to function without it. Depressed now. Heavy chest.  Anxious and guilty.  Body feels heavy.   Hates Wellbutrin.   Plan: Increase fluoxetine to 20 mg daily Add Abilify 1/2 of 15 mg tablet daily Wean wellbutrin by 1 tablet each week  bc she feels it is not helpful and DT polypharmacy Reduce risperidone to 1 daily for 1 week and stop it. Disc risk of mania. Increase Adderall to XR 20 mg AM  08/08/21 Much less depressed and starting to feel normal I feel a lot better. No SE.  Speech normal off risperidone. Sleeping OK on trazaodone and enough.   Noticed benefit from Adderall again. Plan: continue fluoxetine to 20 mg daily Continue Abilify 1/2 of 15 mg tablet daily for depression and mania continue Adderall to XR 20 mg AM  10/10/2021 phone call: Pt stated she feels like the Abilify should be decreased to 23m.She said she is depressed but rational and not suicidal.She has an appt Monday and can wait until then if you prefer. MD response: Reduce the Abilify to 7.5 mg every other day.  We will meet on 10/16/2021  and decide what to do from there.  10/16/2021 appointment with the following noted: More depressed.  Most depressed I've ever been.  Just numb.  Sense of grief.   Thinks the manic episode was unlike anything else she ever had.  Doesn't want to medicate against it.  Don't enjoy people.  Easily overwhelmed.  Had some death thoughts but not suicidal.  Has been functional.  Feels better today after reducing Abilify to every other day but she is only been doing that for 3 days. A/P: Episode of post manic depression was explained. continue fluoxetine to 20 mg daily Hold Abilify for 1 week then resume Abilify 1/2 of 15 mg tablet every other day for depression and mania continue Adderall to XR 20 mg AM  10/27/2021  appointment with the following noted: I'm doing so much better.  Handling the depression better. Better self talk and spiritual focus has helped.   Dep 6/10 manifesting as anxiety with low confidence.   F died 2  years ago and M 66 yo and is dependent . She is working hard to feel better but still feels depressed.  She almost feels like she has a little more anxiety since restarting Abilify every other day. Plan: continue fluoxetine to 20 mg daily DC Abilify .  Vrayalar 1.5 mg QOD to try to get rid of depression ASAP. continue Adderall to XR 20 mg AM  11/10/2021 appointment with the following noted: Busy with Xmas and it was fun with family but then a big let down.  Did well with it.  Functioned well with it.  Working hard on things with depression.  Not shutting down. Not sure but feels better today but yesterday was hard.  Difficulty dealing with mother.  She won't do anything to help herself.  Yesterday with her all day.  Won't do PT and has isolated herself.    Lack of confidence.   No SE with Vraylar.  11/24/21 urgent appointment appt noted: More and more depressed.   So anxious and doesn't want to be alone but can do so. No appetite. Hurts inside. Has had some fleeting suicidal  thoughts but would not act on them.  Tolerating meds. Has been consistent with Vraylar 1.5 mg every other day, fluoxetine 20 mg daily Plan: Increase Vraylar to 1.5 mg daily Change Prozac to Trintellix 10 mg daily. Discussed side effects of each continue Adderall to XR 20 mg AM  12/27/2021 appointment with the following noted: Not OK.  I feel less depressed but feels bat shit. Not sleeping well.  Extremely anxious. Off and on sleep. 3-4 hours of sleep.   Still having daily SI.  But also become obvious has so much to do.  Overwhelmed by tasks.   Needs anxiety meds to just function. Not more motivated.  Walked yesterday.   Feels afraid like in trouble but not irritable or angry. DC DT agitation Vraylar to 1.5 mg daily Change Prozac to Trintellix 10 mg daily. Hold Adderall to XR 20 mg AM Clonidine 0.1 1/2 tablet twice daily for 2 days and if needed for anxiety and sleep increase to 1 twice daily Ok temporary Ativan 1 mg 3 times daily as needed anxiety  01/05/22 appt noted: Off fluoxetine and  Trintellix.  Only on Ativan, trazodone and Adderall XR 20 plus added clonidine 0.1 mg BID Didn't think she needed to start Trintellix. Not taking Ativan.   Didn't like herself last week. Feels some better today. Wonders if the manic sx Not agitated.  Anxiety kind of calmed down.  A lot to be anxious about situationally.  $ stress. Concerns about downers with meds. Can't access normal personality. ? Lethargy and inability to talk as sE. Plan: Latuda 20-40 mg daily with food. Adderall to XR 20 mg AM Clonidine 0.1 1/2 tablet twice daily  reduce dose to be sure no SE Ok temporary Ativan 1 mg 3 times daily as needed anxiety  01/19/22 appt noted: Taking Latuda 20 mg daily.  Took 40 mg once and felt anxious and  SI Still depressed and not very reactive Anxiety mainly about the depression and fears of the future. She wants to revisit manic sx and thinks it was maybe bc taking delta 8 bc was taking a lot  of it so still  doesn't think she's classic bipolar. She wants to only take Prozac bc thinks Latuda is perpetuating depression. Says the delta 8 was very psychaedelic.  When not taking it was not manic.  Sleeping ok again.  Plan: Per her request DC Latuda 20-40 mg daily with food. She wants to continue Prozac alone AMA  Adderall to XR 20 mg AM Clonidine 0.1 1/2 tablet twice daily  reduce dose to be sure no SE Ok temporary Ativan 1 mg 3 times daily as needed anxiety  01/23/2022 phone call complaining of increased anxiety since stopping Latuda.  She will try increasing clonidine.  01/26/2022 phone call not feeling well and wanted to restart the Vraylar.  However notes indicate that had made her agitated therefore she was encouraged to pick up samples of Rexulti 1 mg and start that instead.  02/06/2022 phone call: Stating she felt the Rexulti was helping with depression but she was not sleeping well and obsessing over things.  She was encouraged to increase Rexulti to 2 mg daily and increase trazodone for sleep.  02/09/2022 appointment with the following noted: This was an urgent work in appointment No sleep last night with trazodone 100 mg HS Nothing really better depression or anxiety. Ruminating negative anxious thoughts. Did not tolerate Rexulti because it was causing insomnia.  Does not think it helped depression.  Lacks emotion that she should have.  Lacks her usual personality.  Some hopeless thoughts.  Some death thoughts.  Some suicidal thoughts without plan or intent Plan: DC Rexulti and Prozac & DC trazodone Adderall to XR 20 mg AM Clonidine 0.1 1/tablet twice daily  reduce dose to be sure no SE Ok temporary Ativan 1 mg 3 times daily as needed anxiety Start Seroquel XR 150 mg nightly  03/02/2022 appointment: Langley Gauss called back a few days after starting Seroquel stating it was making her more anxious and more depressed.  This seemed unlikely as this medicine rarely ever causes anxiety.   She stopped the medication waited 3 days and called back still had anxiety and depression but thought perhaps the anxiety was a little better.  She did not want to take the Seroquel. She knew about the option of Spravato and wanted to pursue that. Now questions whether to return to Seroquel while waiting to start Spravato bc feels just as bad without it and knows she didn't give it enough time to work.   MADRS 46  ECT-MADRS    Flowsheet Row Clinical Support from 05/21/2022 in [place] Visit from 03/02/2022 in [facility]  MADRS Total Score 27 46      03/14/22 appt noted: Pt received Spravato 56 mg first dose today with some dissociative sx which were not severe.  She was anxious prior to the administration and felt better after receiving lorazepam 1 mg.  No NV, or HA. Wants to continue Spravato. Ongoing depression and desperate to feel better.  I'm not myself DT deprsssion which is most severe in recent history.  Anhedonia.  Low motivation.  Social avoidance. Continues to think all recent med trials are making her worse.  Sleep ok with Seroquel.  03/16/22 appt noted: Received Spravato 84 mg for the first time.  some dissociative sx which were not severe.  She was anxious prior to the administration and felt better after receiving lorazepam 1 mg.  No NV, or HA. Wants to continue Spravato.   Does not feel any better or different since the last appt.  Ongoing depression.  Ongoing  depression and desperate to feel better.  I'm not myself DT deprsssion which is most severe in recent history.  Anhedonia.  Low motivation.  Social avoidance. Continues to think all recent med trials are making her worse.  Sleep ok with Seroquel.  Does not want to continue Seroquel for TRD.  03/20/2022 appointment noted: Came for Spravato administration today.  However blood pressure was significantly elevated approximately 180/115.  She was given lorazepam 1 mg and clonidine 0.2 mg  to try to get it down. She states she regretted stopping the Seroquel XR 300 mg tablets.  She now realizes it was helpful.  She did not sleep much at all last night.  She did not take the Adderall this morning. 2 to 3 hours after arrival blood pressure was still elevated at  170/110, 62 pulse.  For Spravato administration was canceled for today.  She admits to being anxious and depressed.  She is not suicidal.  She is highly motivated to receive the Spravato.  We discussed getting it tomorrow.  03/22/2022 appointment noted: Patient's blood pressure was never stable enough yesterday in order to get her in for Spravato administration.  She was encouraged to see her primary care doctor.  It is better today.  03/26/2022 appointment with the following noted: Blood pressure was better.  Saw her primary care doctor who started on oral Bystolic 2.5 mg daily. Received Spravato 84 mg today as scheduled.  Tolerated it well without nausea or vomiting headache or chest pain or palpitations.  Her blood pressure was borderline but manageable. She remains depressed and anxious.  She is ambivalent about the medicine and desperate to get to feel better.  Continues to have anhedonia and low energy and low motivation and reduced ability to do things.  Less social.  Not suicidal.  03/28/22 appt noted: Received Spravato 84 mg today as scheduled.  Tolerated it well without nausea or vomiting headache or chest pain or palpitations.  Her blood pressure was borderline but manageable. Has not seen any improvement so far.  Tolerating Seroquel.  Inconsistent with Bystolic and BP has been borderline high. Still depressed and anxious and anhedonia.  Low motivation, energy, productivity. Taking quetiapine and tolerating XR 300 mg nightly.  04/04/22 appt noted: Received Spravato 84 mg today as scheduled.  Tolerated it well without nausea or vomiting headache or chest pain or palpitations.  Her blood pressure was borderline but  manageable. Has not seen any improvement so far.  Tolerating Seroquel.   She still tends to think that the medications are making her worse.  She has said this about each of the recent psychiatric medicines including Seroquel.  However her husband thinks she is improved.  She also admits there is some improvement in productivity.  She still feels highly anxious.  She still does not enjoy things as normal.  She still feels desperate to improve as soon as possible. Has been taking Seroquel XR since 03/20/2022  04/10/22 appt noted: Received Spravato 84 mg today as scheduled.  Tolerated it well without nausea or vomiting headache or chest pain or palpitations.  Her blood pressure was borderline but manageable. Has not seen any improvement so far.  Tolerating Seroquel.  Doesn't like Seroquel bc she thinks it flattens here. 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  Tolerated it well without nausea or vomiting headache or chest pain or palpitations.  Her blood pressure was borderline but manageable. Has not seen any improvement so far.  Tolerating Seroquel.  Doesn't like Seroquel bc she thinks it flattens her. Received Spravato 84 mg today as scheduled.  Tolerated it well without nausea or vomiting headache or chest pain or palpitations.  Her blood pressure was borderline but manageable. Has not seen any improvement so far.  Tolerating Seroquel.  Doesn't like Seroquel bc she thinks it flattens here.  We discussed her ambivalence about it. She is starting Auvelity and has tolerated it the last 2 days without side effect.  She still does not feel like herself and feels flat and not enjoying things with suppressed expressed emotion  04/17/2022 appointment with the following noted: Received Spravato 84 mg today as scheduled.  Tolerated it  well without nausea or vomiting headache or chest pain or palpitations.  Her blood pressure was borderline but manageable. Has not seen any improvement so far.  Tolerating Seroquel.  Doesn't like Seroquel bc she thinks it flattens her. She has been tolerating the Auvelity 1 in the morning without side effects for about a week.  She has not noticed significant improvement so far.  She still feels depressed and flat and not herself.  Other people notice that she is flat emotionally.  She is not suicidal.  She does feel discouraged that she is not getting better yet.  04/19/2022 appointment noted: Has increased Auvelity to 1 twice daily for 2 days, continues quetiapine XR 300 mg nightly, clonidine 0.3 mg twice daily, lorazepam 1 mg twice daily for anxiety and Adderall XR 20 mg in the morning. No obious SE but she still thinks quetiapine XR is making her feel down.  But not sedated Received Spravato 84 mg today as scheduled.  Tolerated it well without nausea or vomiting headache or chest pain or palpitations.  Her blood pressure was borderline but manageable. She still feels quite anxious and feels it necessary to take both the clonidine and lorazepam twice a day to manage her anxiety.  She has been consistently down and flat and not herself until yesterday afternoon she noted an improvement in mood and feeling much more like herself with her normal personality reemerging.  She was quite depressed in the morning with very dark negative thoughts.  She did not have those dark negative thoughts this morning.  She had a lot of questions about medication and when she was expecting to be improved and why she has not shown improvement up to now.  04/23/22 appt noted: Has increased Auvelity to 1 twice daily for 1 week, continues quetiapine XR 300 mg nightly, clonidine 0.3 mg twice daily, lorazepam 1 mg twice daily for anxiety and Adderall XR 20 mg in the morning. No obious SE but she still thinks quetiapine XR is  making her feel down.  But not sedated Received Spravato 84 mg today as scheduled.  Tolerated it well without nausea or vomiting headache or chest pain or palpitations.  She is still depressed but admits better function and is able to enjoy social interactions. Tolerating meds.  Would like to feel better for sure. Not herself.  Flat. Plan increase Auvelity to 1 tab BID as planned and reduce Quetiapine to 1/2 of ER 300 mg  bc NR for depression.  04/25/2022 appointment with the following noted: clonidine 0.3 mg twice daily, lorazepam 1 mg twice daily for anxiety and Adderall XR 20 mg in the morning. Seroquel XR 300 HS No obious SE but she still thinks quetiapine  XR is making her feel down.  But not sedated Received Spravato 84 mg today as scheduled.  Tolerated it well without nausea or vomiting headache or chest pain or palpitations.  Called yesterday with more anxiety.  Had increased Auvelity for 1 day and reduced Seroquel XR for 1 day.  Felt restless and fearful  05/01/2022 appointment noted: clonidine 0.3 mg twice daily, lorazepam 1 mg twice daily for anxiety and Adderall XR 20 mg in the morning. Seroquel XR 150 HS, Auvelity 1 BID Received Spravato 84 mg today as scheduled.  Tolerated it well without nausea or vomiting headache or chest pain or palpitations.  Nurse has noted patient has called multiple times sometimes asking the same question repeatedly.  It is unclear whether she is truly forgetful or is just anxious seeking reassurance. Patient acknowledges ongoing depression as well as some anxiety but states she has felt a little better in the last couple of days.  She has reduced the Seroquel to 150 mg at night and has increased Auvelity to 1 twice daily but only for 1 day.  So far she seems to be tolerating it.  05/03/22 appt noted: clonidine 0.2 mg twice daily, lorazepam 1 mg twice daily for anxiety and Adderall XR 20 mg in the morning. Seroquel XR 150 HS, Auvelity 1 BID BP high this am  about 170/100 and received extra clonidine 0.2 mg and came to receive Spravato.  Not dizzy, no SOB, nor CP but BP is still high Could not receive Spravato today bc BP high and pulse low at 30 ppm. Still depressed and anxious. Plan: continue trial Auvelity with Spravato She needs to get BP and pulse managed  05/08/22 TC: RTC  H Michael NA and mailbox full.  Could not leave message.  Pt  -  talked to she and H on speaker. H worried over wife.  Vacant stare.  Slurs words at times.  Not smiling. Reduced enjoyment.  Depression.  Withdrawn from usual activities.  Some irritability.  Anxious. Disc her concerns meds are making her worse.  Extensive discussion about her treatment resistant status.  There is a consistent pattern of not taking the medicines long enough to get benefit because she believes the meds are making her worse.  However the symptoms she describes as side effects are exactly the same symptoms that she had prior to taking the medication RX for  the depression.  So it is not clear that these are actual side effects. This is true about the 2 most recent meds including Seroquel and Auvelity.  Recommend psychiatric consultation in hopes of improving her comfort level with taking prescribed medications for a sufficient length of time to provide benefit. Extensive discussion about ECT is the treatment of choice for treatment resistant depression.  Spravato may work if she can comply with consistency.  There are medication options but they take longer to work.   Plan:  Reduce clonidine to 0.1 mg BID DT bradycardia.  Talk with PCP about BP and low pulse problems which are interfering with her consistent compliance with Spravato.   Limit lorazepam to 3 -4  mg daily max. Excess use is the cause of slurring speech.  She must stop excess use or will have to stop the med. Stop Auvelity per her request.  But she has only been on the full dose for a little over a week and clearly has not had time to get  benefit from it.  She thinks maybe it is making her more  anxious. Reduce Seroquel from 150XR to 50 -100 mg at night IR.  She couldn't sleep when stopped it completely. Will not start new antidepressant until her SE issues are resolved or not. Get second psych opinion from Yehuda Budd MD or another psychiatrist.  H's sister is therapist in [place], MD, [facility]  05/16/2022 appointment with the following noted: Received Spravato 84 mg today as scheduled.  Tolerated it well without nausea or vomiting headache or chest pain or palpitations.  She stopped Auvelity as discussed last week. On her own, without physician input, she restarted Wellbutrin XL 450 mg every morning today.  She had taken it in the past.  She feels jittery and anxious. She feels less depressed than she did last week.  But she is still depressed without her usual range of affect.  She still is less social and less motivated than normal. Her primary care doctor increased the dose of losartan Plan: Stop Seroquel Reduce Wellbutrin XL to 300 mg every morning.  Starting the dose at 450 every morning is likely causing side effects of jitteriness and it should not be started at that have a dosage. Recommend she not change meds on her own without MDM put  05/23/2022 appointment with the following noted: Received Spravato 84 mg today as scheduled.  Tolerated it well without nausea or vomiting headache or chest pain or palpitations.  Has not dropped seroquel XR 300 mg 1/2 tablet nightly bc couldn't sleep without it. Has not tried lower dose quetiapine 50 mg HS Still feels depressed.   BP is better managed so far, just saw PCP.  BP is better today and infact is low today. Dropped clonidine as directed from 0.3 mg BID bc inadequate control of BP to 0.2 mg BID.  However she wants to increase it back to 0.3 mg twice daily because she feels it helped her anxiety better.  Wonders about increasing Wellbutrin for depression.  However she  has only been on 300 mg a day for a week.  She was on 450 mg daily in the past.  06/06/22 appt noted: Received Spravato 84 mg today as scheduled.  Tolerated it well without nausea or vomiting headache or chest pain or palpitations.  She is still depressed and anxious.  She wants to try to stop the Seroquel but cannot sleep without some of it.  She is taking lorazepam 1 mg 4 times daily and still having a lot of anxiety.  She wants to increase clonidine back to 0.3 mg twice daily.  She hopes for more improvement She recently went for a second psychiatric opinion as suggested the results of that are pending.  7/312/23 appt noted: Received Spravato 84 mg today as scheduled.  Tolerated it well without nausea or vomiting headache or chest pain or palpitations.  Anxiety and depression have remained out of control.  She cannot sleep without the Seroquel and prefers the XR 150 over the immediate release for sleep.  She is discouraged that she is not getting better and feeling hopeless.  She is not suicidal.  She is highly anxious and ruminative and worries about herself.  She is disappointed in not getting more benefit out of the Spravato which causes some degree of hopelessness. She went for a second opinion regarding her psych meds and these records have been requested but not received.  She was told at that particular facility that she could not get [place] there.  Past Psychiatric Medication Trials: fluoxetine, duloxetine, Viibryd, lamotrigine, Pristiq, sertraline,  citalopram,  Trintellix anxious and SI Wellbutrin XL 450  Adderall, Adderall XR, Vyvanse, Ritalin, Strattera low dose NR Lorazepam Trazodone  Depakote,  lamotrigine cog complaints Lithium remotely Abilify 7.5  Vraylar 1.5 mg daily agitation and insomnia Rexulti insomnia Latuda 40 one dose, CO anxious and SI Seroquel XR 300  At visit November 12, 2019. We discussed Patient developed an increasingly severe alcohol dependence problem  since her last visit in January.  She went to [facility] and has had no alcohol since then except 1 day.  She never abused stimulants but they took her off the stimulants at [facility].  Her ADD was markedly worse.  The Wellbutrin did not help the ADD.   D history lamotrigine rash at 66 yo  Review of Systems:  Review of Systems  Constitutional:  Positive for fatigue.  Respiratory:  Negative for shortness of breath.   Cardiovascular:  Negative for chest pain and palpitations.  Musculoskeletal:  Positive for arthralgias, back pain and joint swelling.       SP hip surgery October 2020  Neurological:  Negative for dizziness and light-headedness.  Psychiatric/Behavioral:  Positive for decreased concentration and dysphoric mood. Negative for behavioral problems, confusion, hallucinations, self-injury, sleep disturbance and suicidal ideas. The patient is nervous/anxious. The patient is not hyperactive.     Medications: I have reviewed the patient's current medications.  Current Outpatient Medications  Medication Sig Dispense Refill   amLODipine (NORVASC) 2.5 MG tablet Take 2.5 mg by mouth daily.     amphetamine-dextroamphetamine (ADDERALL XR) 20 MG 24 hr capsule Take 1 capsule (20 mg total) by mouth every morning. 30 capsule 0   amphetamine-dextroamphetamine (ADDERALL XR) 20 MG 24 hr capsule Take 1 capsule (20 mg total) by mouth every morning. 30 capsule 0   buPROPion (WELLBUTRIN XL) 150 MG 24 hr tablet Take 3 tablets (450 mg total) by mouth daily. 90 tablet 0   iron polysaccharides (NIFEREX) 150 MG capsule TAKE 1 CAPSULE BY MOUTH EVERY DAY 90 capsule 1   LORazepam (ATIVAN) 1 MG tablet Take 1 tablet (1 mg total) by mouth every 6 (six) hours as needed for anxiety. 100 tablet 0   losartan (COZAAR) 50 MG tablet Take 50 mg by mouth daily.     nebivolol (BYSTOLIC) 2.5 MG tablet Take 2.5 mg by mouth daily.     cloNIDine (CATAPRES) 0.2 MG tablet Take 1 tablet (0.2 mg total) by mouth 2 (two)  times daily. 60 tablet 1   Esketamine HCl, 84 MG Dose, (SPRAVATO, 84 MG DOSE,) 28 MG/DEVICE SOPK USE 3 SPRAYS IN EACH NOSTRIL TWICE WEEKLY 3 each 5   OLANZapine (ZYPREXA) 10 MG tablet Take 1 tablet (10 mg total) by mouth at bedtime. 30 tablet 0   No current facility-administered medications for this visit.    Medication Side Effects: None  Allergies:  Allergies  Allergen Reactions   Metronidazole Shortness Of Breath and Other (See Comments)    Heart pounding   Ferrlecit [Na Ferric Gluc Cplx In Sucrose] Other (See Comments)    Infusion reaction 05/12/2019    Past Medical History:  Diagnosis Date   ADHD    Anemia    Anxiety    Arthritis    Depression    Heart murmur    i went to see a cardiologit slast eyar  and i had zero plaque,    PONV (postoperative nausea and vomiting)    Recovering alcoholic in remission [facility])     Family History  Problem Relation Age  of Onset   Atrial fibrillation Mother    CAD Father     Social History   Socioeconomic History   Marital status: Married    Spouse name: Not on file   Number of children: Not on file   Years of education: Not on file   Highest education level: Not on file  Occupational History   Not on file  Tobacco Use   Smoking status: Former    Types: Cigarettes    Quit date: 08/16/2003    Years since quitting: 18.8   Smokeless tobacco: Never   Tobacco comments:     08-28-2019 "i smoked 2 cigarettes in the last month since my father  passed"  Vaping Use   Vaping Use: Never used  Substance and Sexual Activity   Alcohol use: Yes    Alcohol/week: 10.0 standard drinks of alcohol    Types: 10 Glasses of wine per week   Drug use: No   Sexual activity: Not on file  Other Topics Concern   Not on file  Social History Narrative   Not on file   Social Determinants of Health   Financial Resource Strain: Not on file  Food Insecurity: Not on file  Transportation Needs: Not on file  Physical Activity: Not on file  Stress:  Not on file  Social Connections: Not on file  Intimate Partner Violence: Not on file    Past Medical History, Surgical history, Social history, and Family history were reviewed and updated as appropriate.   Please see review of systems for further details on the patient's review from today.   Objective:   Physical Exam:  There were no vitals taken for this visit.  Physical Exam Constitutional:      General: She is not in acute distress. Neurological:     Mental Status: She is alert and oriented to person, place, and time.     Coordination: Coordination normal.     Gait: Gait normal.  Psychiatric:        Attention and Perception: Attention and perception normal.        Mood and Affect: Mood is anxious and depressed. Affect is blunt. Affect is not labile, angry, tearful or inappropriate.        Speech: Speech is not rapid and pressured.        Behavior: Behavior is not slowed.        Thought Content: Thought content is not paranoid or delusional. Thought content does not include homicidal or suicidal ideation. Thought content does not include suicidal plan.        Cognition and Memory: Cognition normal. Memory is impaired. She does not exhibit impaired recent memory.        Judgment: Judgment normal.     Comments: Insight intact. No auditory or visual hallucinations. No delusions.  Depression  ongoing. Affect still depressed and more blunted than usual. No Sui intent plan      Lab Review:     Component Value Date/Time   NA 137 01/12/2021 1430   NA 140 11/18/2018 1544   K 3.8 01/12/2021 1430   CL 108 01/12/2021 1430   CO2 22 01/12/2021 1430   GLUCOSE 94 01/12/2021 1430   BUN 14 01/12/2021 1430   BUN 20 11/18/2018 1544   CREATININE 0.82 01/12/2021 1430   CALCIUM 8.9 01/12/2021 1430   PROT 6.6 01/12/2021 1430   ALBUMIN 3.9 01/12/2021 1430   AST 12 (L) 01/12/2021 1430   ALT 11 01/12/2021 1430  ALKPHOS 46 01/12/2021 1430   BILITOT 0.5 01/12/2021 1430   GFRNONAA >60  01/12/2021 1430   GFRAA >60 09/02/2019 0249   GFRAA >60 01/27/2019 0811       Component Value Date/Time   WBC 4.5 01/12/2021 1430   RBC 4.32 01/12/2021 1430   HGB 12.8 01/12/2021 1430   HGB 12.9 07/17/2019 0953   HCT 38.5 01/12/2021 1430   HCT 21.9 (L) 12/25/2018 1221   PLT 272 01/12/2021 1430   PLT 286 07/17/2019 0953   MCV 89.1 01/12/2021 1430   MCH 29.6 01/12/2021 1430   MCHC 33.2 01/12/2021 1430   RDW 12.4 01/12/2021 1430   LYMPHSABS 1.4 01/12/2021 1430   MONOABS 0.4 01/12/2021 1430   EOSABS 0.0 01/12/2021 1430   BASOSABS 0.0 01/12/2021 1430    No results found for: "POCLITH", "LITHIUM"   No results found for: "PHENYTOIN", "PHENOBARB", "VALPROATE", "CBMZ"   .res Assessment: Plan:    Recurrent major depression resistant to treatment ([facility])  Generalized anxiety disorder  Attention deficit hyperactivity disorder (ADHD), predominantly inattentive type  Insomnia due to mental condition  Accelerated hypertension   She has treatment resistant major depression at this time.  Have  discussed some of her recent abnormal behaviors leading to this depressive episode getting worse which she says were associated with heavy use of delta 8 and not a manic episode.  She realizes now that that was not good for her.  She stopped all use of other drugs including those available over-the-counter such as delta 8 or any other THC related products.  She is no longer having any of those types of behaviors and instead is depressed. She remains persistently depressed with lack of interest and lack of feeling for things that normally she would have feelings about.  She has low motivation and energy.  She is sad and down.  She is less productive than usual.  Her concentration is poor.  She has high degree of anxiety as well. She has a very negative self-image and low self-esteem.  These are all uncharacteristic for her  Because of the question of cognitive issues we will check TSH, B12 and  folate.  Pending She has still not done these blood tests.  Blood pressure was better today so she was able to receive Spravato.  It is markedly lower  Patient was administered Spravato 84 mg intranasally today.  The patient experienced the typical dissociation which gradually resolved over the 2-hour period of observation.  There were no complications.  Specifically the patient did not have nausea or vomiting or headache.  Blood pressures remained within normal ranges at the 40-minute and 2-hour follow-up intervals.  By the time the 2-hour observation period was met the patient was alert and oriented and able to exit without assistance.  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  See nursing note for further details.  Failed multiple antidepressants.  Many of them were not actual failures but intolerances and it is unclear whether some of that was more connected with anxiety than true side effects.  1 example is the [place].  In general she does not want to try anything but an antidepressant but has failed all major categories of antidepressants except TCAs and MAO inhibitors which have not been tried.  Could consider a TCA but given her manic response to delta 8 there is some concern about the risk of a TCA triggering mania.  It could  be combined with lithium but she generally is unfavorable about taking lithium.  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  She wants to continue Spravato.  We discussed discussed the side effects in detail as well as the protocol required to receive Spravato.  S  Seroquel is not significantly helped with depression or anxiety though it has helped sleep.  For her treatment resistant depression and anxiety we will switch from Seroquel to  olanzapine 10 mg nightly.  We discussed the side effects in detail.  Her anxiety has not been manageable with any other treatment.  continue Wellbutrin XL to prior dose 450 mg AM  Started Spravato 84 mg twice weekly on 03/16/2022.   Due to inadequate response with Spravato recommend [place] consult at [facility].  This was discussed in detail  Adderall  XR 20 mg AM  Ok temporary Ativan 1 mg 3-4 times daily as needed anxiety but try to cut it back. Is not ideal to use benzodiazepine with stimulant but because of the severity of her symptoms it has been necessary.  Hope to eventually eliminate the benzodiazepine.  Expected as her depression improves her anxiety will improve as well.  However lately her anxiety has been unmanageable.  We will expect that to improve as the depression improves.  She has headed insturctions to reduce this.  PCP managing BP and it is better today Cannot increase clonidine back to 0.3 mg twice daily because currently her blood pressure is low.  We will continue to follow and reevaluate. Continue clonidine 0.2 mg BID  Discussed potential benefits, risks, and side effects of stimulants with patient to include increased heart rate, palpitations, insomnia, increased anxiety, increased irritability, or decreased appetite.  Instructed patient to contact office if experiencing any significant tolerability issues. She wants to return to usual dose of Adderall for ADD bc of mor poor cognitive function with reduction.  Also discussed that depression will impair cognitive function.  Discussed safety plan at length with patient.  Advised patient to contact office with any worsening signs and symptoms.  Instructed patient to go to the [facility] emergency room for evaluation if experiencing any acute safety concerns, to include suicidal intent.  Has Maintained sobriety  FU with Spravato twice weekly  Lynder Parents, MD, DFAPA     Please see After Visit Summary for patient  specific instructions.  Future Appointments  Date Time Provider [place]  06/20/2022  9:30 AM Cottle, Billey Co., MD CP-CP None  06/20/2022  9:30 AM CP-NURSE CP-CP None           No orders of the defined types were placed in this encounter.      -------------------------------

## 2022-06-08 ENCOUNTER — Encounter: Payer: Self-pay | Admitting: Psychiatry

## 2022-06-10 NOTE — Progress Notes (Signed)
Nurse visit:   Patient arrived for her 21st Spravato treatment. Pt is being treated for Treatment Resistant Depression, pt will be receiving 84 mg which will continue to be her maintenance dose, she continues receiving twice weekly as long as her b/p is stable.  Patient arrived and taken to treatment room. Confirmed she had a ride home which is her husband would be coming back to pick up pt when done and sometimes she needs to use [facility] Popper if he is unable to pick her up. Pt's Spravato is ordered through [employer] and delivered to office, all Spravato medication is stored at doctors office per REMS/FDA guidelines. The medication is required to be locked behind two doors per FDA/REMS Protocol. Medication is also disposed of properly per regulations.      Began taking patient's vital signs at 9:35 AM 134/82, pulse 49. Instructed patient to blow her nose then recline back to a 45 degree angle. Gave patient first dose 28 mg nasal spray, patient is still trying to get the hang of using the nasal sprays, each nasal spray administered in each nostril as directed and waited 5 minutes between the second and third dose. All 3 doses given pt did not complain of any nausea/vomiting, given a cup of water due to the taste after the administration of Spravato.  She listens to [employer] with spa or relaxing music.  Checked 40 minute vitals at 10:30 AM, 102/56, pulse was 57. Explained she would be monitored for a total time of 120 minutes. Discharge vitals were taken at 11:45  AM 103/59 P 52. Dr. Clovis Pu met with her and discussed her medication and made a couple changes.   I walked pt to elevator, where her husband met her for the ride home. Recommend she go home and sleep or just relax on the couch. No driving, no intense activities. Verbalized understanding. Nurse was with pt a total of 70 minutes for clinical. Pt is coming back in on Monday, July 31st. We will continue twice weekly at this time.  Pt instructed to call  office with any problems or questions.      LOT [id number] EXP [id number]

## 2022-06-11 ENCOUNTER — Other Ambulatory Visit: Payer: Self-pay | Admitting: Psychiatry

## 2022-06-11 ENCOUNTER — Ambulatory Visit: Payer: 59

## 2022-06-11 ENCOUNTER — Ambulatory Visit (INDEPENDENT_AMBULATORY_CARE_PROVIDER_SITE_OTHER): Payer: 59 | Admitting: Psychiatry

## 2022-06-11 ENCOUNTER — Encounter: Payer: Self-pay | Admitting: Psychiatry

## 2022-06-11 VITALS — BP 120/75 | HR 55

## 2022-06-11 DIAGNOSIS — F900 Attention-deficit hyperactivity disorder, predominantly inattentive type: Secondary | ICD-10-CM

## 2022-06-11 DIAGNOSIS — F339 Major depressive disorder, recurrent, unspecified: Secondary | ICD-10-CM

## 2022-06-11 DIAGNOSIS — F411 Generalized anxiety disorder: Secondary | ICD-10-CM | POA: Diagnosis not present

## 2022-06-11 DIAGNOSIS — I10 Essential (primary) hypertension: Secondary | ICD-10-CM

## 2022-06-11 DIAGNOSIS — F5105 Insomnia due to other mental disorder: Secondary | ICD-10-CM | POA: Diagnosis not present

## 2022-06-11 MED ORDER — CLONIDINE HCL 0.2 MG PO TABS: 0.1000 mg | ORAL_TABLET | Freq: Two times a day (BID) | ORAL | 1 refills | Status: DC

## 2022-06-11 MED ORDER — OLANZAPINE 10 MG PO TABS: 10.0000 mg | ORAL_TABLET | Freq: Every day | ORAL | 0 refills | Status: DC

## 2022-06-11 NOTE — Progress Notes (Signed)
Nurse visit:   Patient arrived for her 22nd Spravato treatment. Pt is being treated for Treatment Resistant Depression, pt will be receiving 84 mg which will continue to be her maintenance dose, she continues receiving twice weekly as long as her b/p is stable.  Patient arrived and taken to treatment room. Confirmed she had a ride home which is her husband would be coming back to pick up pt when done and sometimes she needs to use [facility] if he is unable to pick her up. Pt's Spravato is ordered through [employer] and delivered to office, all Spravato medication is stored at doctors office per REMS/FDA guidelines. The medication is required to be locked behind two doors per FDA/REMS Protocol. Medication is also disposed of properly per regulations.      Began taking patient's vital signs at 9:50 AM 125/78, pulse 55. Instructed patient to blow her nose then recline back to a 45 degree angle. Gave patient first dose 28 mg nasal spray, each nasal spray administered in each nostril as directed and waited 5 minutes between the second and third dose. All 3 doses given pt did not complain of any nausea/vomiting, given a cup of water due to the taste after the administration of Spravato.  She listens to [employer] with spa or relaxing music.  Checked 40 minute vitals at 10:45 AM, 113/72, pulse was 56. Explained she would be monitored for a total time of 120 minutes. Discharge vitals were taken at 11:50 AM 120/75 P 55. Dr. Clovis Pu met with her and discussed her medication and made a couple changes.   I walked pt to elevator, where her husband met her for the ride home. Recommend she go home and sleep or just relax on the couch. No driving, no intense activities. Verbalized understanding. Nurse was with pt a total of 70 minutes for clinical. Pt is coming back in on Wednesday, August 2nd. We will continue twice weekly at this time.  Pt instructed to call office with any problems or questions.      LOT [id number] [id number]  APR 2025

## 2022-06-11 NOTE — Progress Notes (Signed)
Laura Chang [id number] [birth year] 66 y.o.  Subjective:   Patient ID:  Laura Chang is a 66 y.o. (DOB 06-05-1956) female.  Chief Complaint:  Chief Complaint  Patient presents with  . Follow-up  . Depression  . Anxiety  . ADD     HPI Laura Chang Laura Chang presents to the office today for follow-up of depression and anxiety and ADD.  seen November 12, 2019.  Melted down in 2020.  Went to [employer] in July.  No withdrawal.  1 drink since then.  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 She was on no stimulant and no SSRI but was taking Strattera and Wellbutrin.  The following changes were made. Stop Strattera. OK restart stimulant bc severe ADD Restart Adderall 1 daily for a few days and if tolerated then restart 1 twice daily. If not tolerated reduce the dosage if needed. May need to stop Wellbutrin if not tolerating the stimulant.  Yes.  DC Wellbutrin Restart Prozac 20 mg daily.  February 2021 appointment with the following noted: Completed grant proposal.  Couldn't doit without Adderall.  Sold a bunch of work.   Adderall XR lasts about 3 pm.  Strength seems about right.  BP been OK.  Not jittery.   Stopped Wellbutrin but had no SE. Mood drastically better with grant proposal and back on fluoxetine.  Less depressed and lethargic.  No anxiety.  Cut back on coffee. Started back with devotions and stronger faith. Plan: Continue Prozac 20 mg daily. May have to increase the dose at some point in the future given that she usually was taking higher dosages but she is getting good response at this time. Restart Wellbutrin off label for ADD since can't get 2 ADDERALL daily. 150 mg daily then 300 mg daily. She can adjust the dose between 150 mg and 300 mg daily to get the optimal effect.   05/11/2020 appointment with the following noted: Has been inconsistent with Prozac and Wellbutrin. Not sure of the effect of Wellbutrin. Biggest deterrent in work is  anxiety.  Some of the work is conceptual and difficult at times.  Can feel she's not up to a project at times.  Overall is OK but would like a steadier benefit from stimulant.  Exhausted from managing concentration and keeping up with things from the day.  Loses things.  Not good keeping up with schedule. Overall productive and emotionally OK. Can feel Adderall wear off. Mood is better in summer and worse in the winter.   F died in Oct 13, 2023 and that is a loss. No SE Wellbutrin. Still attends AA meetings.  Real benefit from [place] last year. Recognizes effect of anemia on ADD and mood.  Had iron infusions last winter. Plan:  Wellbutrin off label for ADD since can't get 2 ADDERALL daily. 150 mg daily then 300 mg daily.  01/24/2021 appointment with following noted: Doing a program called [employer] mindfulness app since Xmas.  CBT app helped the depression.  App helped her focus better.  Lost sign weight. Writing a lot. Before Xmas felt depressed and started negative thinking worse, self denigrating. Not drinking. More isolated.   Recognizes mo is narcissist.    Didn't tell anyone she was born until 3 mos later.  M aloof and uninterested in pt.  Lied about her birthday.  Mo lack of affection even with pt's kids. Going to [place] for a year and it helped her to quit drinking. Also misses kids being gone  with a hole also. Plan: No med changes  05/04/2021 appointment with the following noted: Therapist Laura Chang thinks she's manic. Lost weight to 144#.   States she is still sleeping okay.  Admits she is hyper and recognizes that she is likely manic.  She feels great, euphoric with an increased sense of spiritual connectedness to God.  She has racing thoughts and talks fast and talks a lot and this is noted by her husband.  He thinks she is a bit hyper.  She has been able to maintain sobriety although she will have 1 glass of wine on special occasions but does not drink by herself.  She is not  drinking to excess.  She denies any dangerous impulsivity.  She is clearly not depressed and not particularly anxious.  She has no concerns about her medication and she has been compliant.  06/16/21 appt noted: So much better.  Going through a lot but the manic thing happened on top of it.  So much slower.  Didn't feel like losing anything with risperidone.  Likes the Adderalll at 10 mg. Some drowsiness in the AM and very drowsy from risperidone 2 mg HS. Prayer life is better. Handling stress better. Less depressed with risperidone. Still likes trazodone. Sleeps well. Plan: Reduce Prozac to 10 mg daily.  Consider stopping it because it can feel the mania however she is reluctant to do that because she fears relapse of depression. Reduce risperidone to 1.5 mg nightly due to side effects.  Discussed risk of worsening mania.  07/25/2021 appointment with the following noted: Misses the Adderall and hard to function without it. Depressed now. Heavy chest.  Anxious and guilty.  Body feels heavy.   Hates Wellbutrin.   Plan: Increase fluoxetine to 20 mg daily Add Abilify 1/2 of 15 mg tablet daily Wean wellbutrin by 1 tablet each week  bc she feels it is not helpful and DT polypharmacy Reduce risperidone to 1 daily for 1 week and stop it. Disc risk of mania. Increase Adderall to XR 20 mg AM  08/08/21 Much less depressed and starting to feel normal I feel a lot better. No SE.  Speech normal off risperidone. Sleeping OK on trazaodone and enough.   Noticed benefit from Adderall again. Plan: continue fluoxetine to 20 mg daily Continue Abilify 1/2 of 15 mg tablet daily for depression and mania continue Adderall to XR 20 mg AM  10/10/2021 phone call: Pt stated she feels like the Abilify should be decreased to 31m.She said she is depressed but rational and not suicidal.She has an appt Monday and can wait until then if you prefer. MD response: Reduce the Abilify to 7.5 mg every other day.  We will meet on  10/16/2021 and decide what to do from there.  10/16/2021 appointment with the following noted: More depressed.  Most depressed I've ever been.  Just numb.  Sense of grief.   Thinks the manic episode was unlike anything else she ever had.  Doesn't want to medicate against it.  Don't enjoy people.  Easily overwhelmed.  Had some death thoughts but not suicidal.  Has been functional.  Feels better today after reducing Abilify to every other day but she is only been doing that for 3 days. A/P: Episode of post manic depression was explained. continue fluoxetine to 20 mg daily Hold Abilify for 1 week then resume Abilify 1/2 of 15 mg tablet every other day for depression and mania continue Adderall to XR 20 mg AM  10/27/2021  appointment with the following noted: I'm doing so much better.  Handling the depression better. Better self talk and spiritual focus has helped.   Dep 6/10 manifesting as anxiety with low confidence.   F died 2  years ago and M 66 yo and is dependent . She is working hard to feel better but still feels depressed.  She almost feels like she has a little more anxiety since restarting Abilify every other day. Plan: continue fluoxetine to 20 mg daily DC Abilify .  Vrayalar 1.5 mg QOD to try to get rid of depression ASAP. continue Adderall to XR 20 mg AM  11/10/2021 appointment with the following noted: Busy with Xmas and it was fun with family but then a big let down.  Did well with it.  Functioned well with it.  Working hard on things with depression.  Not shutting down. Not sure but feels better today but yesterday was hard.  Difficulty dealing with mother.  She won't do anything to help herself.  Yesterday with her all day.  Won't do PT and has isolated herself.    Lack of confidence.   No SE with Vraylar.  11/24/21 urgent appointment appt noted: More and more depressed.   So anxious and doesn't want to be alone but can do so. No appetite. Hurts inside. Has had some fleeting  suicidal thoughts but would not act on them.  Tolerating meds. Has been consistent with Vraylar 1.5 mg every other day, fluoxetine 20 mg daily Plan: Increase Vraylar to 1.5 mg daily Change Prozac to Trintellix 10 mg daily. Discussed side effects of each continue Adderall to XR 20 mg AM  12/27/2021 appointment with the following noted: Not OK.  I feel less depressed but feels bat shit. Not sleeping well.  Extremely anxious. Off and on sleep. 3-4 hours of sleep.   Still having daily SI.  But also become obvious has so much to do.  Overwhelmed by tasks.   Needs anxiety meds to just function. Not more motivated.  Walked yesterday.   Feels afraid like in trouble but not irritable or angry. DC DT agitation Vraylar to 1.5 mg daily Change Prozac to Trintellix 10 mg daily. Hold Adderall to XR 20 mg AM Clonidine 0.1 1/2 tablet twice daily for 2 days and if needed for anxiety and sleep increase to 1 twice daily Ok temporary Ativan 1 mg 3 times daily as needed anxiety  01/05/22 appt noted: Off fluoxetine and  Trintellix.  Only on Ativan, trazodone and Adderall XR 20 plus added clonidine 0.1 mg BID Didn't think she needed to start Trintellix. Not taking Ativan.   Didn't like herself last week. Feels some better today. Wonders if the manic sx Not agitated.  Anxiety kind of calmed down.  A lot to be anxious about situationally.  $ stress. Concerns about downers with meds. Can't access normal personality. ? Lethargy and inability to talk as sE. Plan: Latuda 20-40 mg daily with food. Adderall to XR 20 mg AM Clonidine 0.1 1/2 tablet twice daily  reduce dose to be sure no SE Ok temporary Ativan 1 mg 3 times daily as needed anxiety  01/19/22 appt noted: Taking Latuda 20 mg daily.  Took 40 mg once and felt anxious and  SI Still depressed and not very reactive Anxiety mainly about the depression and fears of the future. She wants to revisit manic sx and thinks it was maybe bc taking delta 8 bc was  taking a lot of it so still  doesn't think she's classic bipolar. She wants to only take Prozac bc thinks Latuda is perpetuating depression. Says the delta 8 was very psychaedelic.  When not taking it was not manic.  Sleeping ok again.  Plan: Per her request DC Latuda 20-40 mg daily with food. She wants to continue Prozac alone AMA  Adderall to XR 20 mg AM Clonidine 0.1 1/2 tablet twice daily  reduce dose to be sure no SE Ok temporary Ativan 1 mg 3 times daily as needed anxiety  01/23/2022 phone call complaining of increased anxiety since stopping Latuda.  She will try increasing clonidine.  01/26/2022 phone call not feeling well and wanted to restart the Vraylar.  However notes indicate that had made her agitated therefore she was encouraged to pick up samples of Rexulti 1 mg and start that instead.  02/06/2022 phone call: Stating she felt the Rexulti was helping with depression but she was not sleeping well and obsessing over things.  She was encouraged to increase Rexulti to 2 mg daily and increase trazodone for sleep.  02/09/2022 appointment with the following noted: This was an urgent work in appointment No sleep last night with trazodone 100 mg HS Nothing really better depression or anxiety. Ruminating negative anxious thoughts. Did not tolerate Rexulti because it was causing insomnia.  Does not think it helped depression.  Lacks emotion that she should have.  Lacks her usual personality.  Some hopeless thoughts.  Some death thoughts.  Some suicidal thoughts without plan or intent Plan: DC Rexulti and Prozac & DC trazodone Adderall to XR 20 mg AM Clonidine 0.1 1/tablet twice daily  reduce dose to be sure no SE Ok temporary Ativan 1 mg 3 times daily as needed anxiety Start Seroquel XR 150 mg nightly  03/02/2022 appointment: Langley Gauss called back a few days after starting Seroquel stating it was making her more anxious and more depressed.  This seemed unlikely as this medicine rarely ever  causes anxiety.  She stopped the medication waited 3 days and called back still had anxiety and depression but thought perhaps the anxiety was a little better.  She did not want to take the Seroquel. She knew about the option of Spravato and wanted to pursue that. Now questions whether to return to Seroquel while waiting to start Spravato bc feels just as bad without it and knows she didn't give it enough time to work.   MADRS 46  ECT-MADRS    Flowsheet Row Clinical Support from 05/21/2022 in [facility] Visit from 03/02/2022 in [facility]  MADRS Total Score 27 46      03/14/22 appt noted: Pt received Spravato 56 mg first dose today with some dissociative sx which were not severe.  She was anxious prior to the administration and felt better after receiving lorazepam 1 mg.  No NV, or HA. Wants to continue Spravato. Ongoing depression and desperate to feel better.  I'm not myself DT deprsssion which is most severe in recent history.  Anhedonia.  Low motivation.  Social avoidance. Continues to think all recent med trials are making her worse.  Sleep ok with Seroquel.  03/16/22 appt noted: Received Spravato 84 mg for the first time.  some dissociative sx which were not severe.  She was anxious prior to the administration and felt better after receiving lorazepam 1 mg.  No NV, or HA. Wants to continue Spravato.   Does not feel any better or different since the last appt.  Ongoing depression.  Ongoing  depression and desperate to feel better.  I'm not myself DT deprsssion which is most severe in recent history.  Anhedonia.  Low motivation.  Social avoidance. Continues to think all recent med trials are making her worse.  Sleep ok with Seroquel.  Does not want to continue Seroquel for TRD.  03/20/2022 appointment noted: Came for Spravato administration today.  However blood pressure was significantly elevated approximately 180/115.  She was given lorazepam 1 mg and  clonidine 0.2 mg to try to get it down. She states she regretted stopping the Seroquel XR 300 mg tablets.  She now realizes it was helpful.  She did not sleep much at all last night.  She did not take the Adderall this morning. 2 to 3 hours after arrival blood pressure was still elevated at  170/110, 62 pulse.  For Spravato administration was canceled for today.  She admits to being anxious and depressed.  She is not suicidal.  She is highly motivated to receive the Spravato.  We discussed getting it tomorrow.  03/22/2022 appointment noted: Patient's blood pressure was never stable enough yesterday in order to get her in for Spravato administration.  She was encouraged to see her primary care doctor.  It is better today.  03/26/2022 appointment with the following noted: Blood pressure was better.  Saw her primary care doctor who started on oral Bystolic 2.5 mg daily. Received Spravato 84 mg today as scheduled.  Tolerated it well without nausea or vomiting headache or chest pain or palpitations.  Her blood pressure was borderline but manageable. She remains depressed and anxious.  She is ambivalent about the medicine and desperate to get to feel better.  Continues to have anhedonia and low energy and low motivation and reduced ability to do things.  Less social.  Not suicidal.  03/28/22 appt noted: Received Spravato 84 mg today as scheduled.  Tolerated it well without nausea or vomiting headache or chest pain or palpitations.  Her blood pressure was borderline but manageable. Has not seen any improvement so far.  Tolerating Seroquel.  Inconsistent with Bystolic and BP has been borderline high. Still depressed and anxious and anhedonia.  Low motivation, energy, productivity. Taking quetiapine and tolerating XR 300 mg nightly.  04/04/22 appt noted: Received Spravato 84 mg today as scheduled.  Tolerated it well without nausea or vomiting headache or chest pain or palpitations.  Her blood pressure was  borderline but manageable. Has not seen any improvement so far.  Tolerating Seroquel.   She still tends to think that the medications are making her worse.  She has said this about each of the recent psychiatric medicines including Seroquel.  However her husband thinks she is improved.  She also admits there is some improvement in productivity.  She still feels highly anxious.  She still does not enjoy things as normal.  She still feels desperate to improve as soon as possible. Has been taking Seroquel XR since 03/20/2022  04/10/22 appt noted: Received Spravato 84 mg today as scheduled.  Tolerated it well without nausea or vomiting headache or chest pain or palpitations.  Her blood pressure was borderline but manageable. Has not seen any improvement so far.  Tolerating Seroquel.  Doesn't like Seroquel bc she thinks it flattens here. 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  Tolerated it well without nausea or vomiting headache or chest pain or palpitations.  Her blood pressure was borderline but manageable. Has not seen any improvement so far.  Tolerating Seroquel.  Doesn't like Seroquel bc she thinks it flattens her. Received Spravato 84 mg today as scheduled.  Tolerated it well without nausea or vomiting headache or chest pain or palpitations.  Her blood pressure was borderline but manageable. Has not seen any improvement so far.  Tolerating Seroquel.  Doesn't like Seroquel bc she thinks it flattens here.  We discussed her ambivalence about it. She is starting Auvelity and has tolerated it the last 2 days without side effect.  She still does not feel like herself and feels flat and not enjoying things with suppressed expressed emotion  04/17/2022 appointment with the following noted: Received Spravato 84 mg today as scheduled.   Tolerated it well without nausea or vomiting headache or chest pain or palpitations.  Her blood pressure was borderline but manageable. Has not seen any improvement so far.  Tolerating Seroquel.  Doesn't like Seroquel bc she thinks it flattens her. She has been tolerating the Auvelity 1 in the morning without side effects for about a week.  She has not noticed significant improvement so far.  She still feels depressed and flat and not herself.  Other people notice that she is flat emotionally.  She is not suicidal.  She does feel discouraged that she is not getting better yet.  04/19/2022 appointment noted: Has increased Auvelity to 1 twice daily for 2 days, continues quetiapine XR 300 mg nightly, clonidine 0.3 mg twice daily, lorazepam 1 mg twice daily for anxiety and Adderall XR 20 mg in the morning. No obious SE but she still thinks quetiapine XR is making her feel down.  But not sedated Received Spravato 84 mg today as scheduled.  Tolerated it well without nausea or vomiting headache or chest pain or palpitations.  Her blood pressure was borderline but manageable. She still feels quite anxious and feels it necessary to take both the clonidine and lorazepam twice a day to manage her anxiety.  She has been consistently down and flat and not herself until yesterday afternoon she noted an improvement in mood and feeling much more like herself with her normal personality reemerging.  She was quite depressed in the morning with very dark negative thoughts.  She did not have those dark negative thoughts this morning.  She had a lot of questions about medication and when she was expecting to be improved and why she has not shown improvement up to now.  04/23/22 appt noted: Has increased Auvelity to 1 twice daily for 1 week, continues quetiapine XR 300 mg nightly, clonidine 0.3 mg twice daily, lorazepam 1 mg twice daily for anxiety and Adderall XR 20 mg in the morning. No obious SE but she still thinks  quetiapine XR is making her feel down.  But not sedated Received Spravato 84 mg today as scheduled.  Tolerated it well without nausea or vomiting headache or chest pain or palpitations.  She is still depressed but admits better function and is able to enjoy social interactions. Tolerating meds.  Would like to feel better for sure. Not herself.  Flat. Plan increase Auvelity to 1 tab BID as planned and reduce Quetiapine to 1/2 of ER 300 mg  bc NR for depression.  04/25/2022 appointment with the following noted: clonidine 0.3 mg twice daily, lorazepam 1 mg twice daily for anxiety and Adderall XR 20 mg in the morning. Seroquel XR 300 HS No obious SE but she still thinks quetiapine  XR is making her feel down.  But not sedated Received Spravato 84 mg today as scheduled.  Tolerated it well without nausea or vomiting headache or chest pain or palpitations.  Called yesterday with more anxiety.  Had increased Auvelity for 1 day and reduced Seroquel XR for 1 day.  Felt restless and fearful  05/01/2022 appointment noted: clonidine 0.3 mg twice daily, lorazepam 1 mg twice daily for anxiety and Adderall XR 20 mg in the morning. Seroquel XR 150 HS, Auvelity 1 BID Received Spravato 84 mg today as scheduled.  Tolerated it well without nausea or vomiting headache or chest pain or palpitations.  Nurse has noted patient has called multiple times sometimes asking the same question repeatedly.  It is unclear whether she is truly forgetful or is just anxious seeking reassurance. Patient acknowledges ongoing depression as well as some anxiety but states she has felt a little better in the last couple of days.  She has reduced the Seroquel to 150 mg at night and has increased Auvelity to 1 twice daily but only for 1 day.  So far she seems to be tolerating it.  05/03/22 appt noted: clonidine 0.2 mg twice daily, lorazepam 1 mg twice daily for anxiety and Adderall XR 20 mg in the morning. Seroquel XR 150 HS, Auvelity 1 BID BP  high this am about 170/100 and received extra clonidine 0.2 mg and came to receive Spravato.  Not dizzy, no SOB, nor CP but BP is still high Could not receive Spravato today bc BP high and pulse low at 30 ppm. Still depressed and anxious. Plan: continue trial Auvelity with Spravato She needs to get BP and pulse managed  05/08/22 TC: RTC  H Michael NA and mailbox full.  Could not leave message.  Pt  -  talked to she and H on speaker. H worried over wife.  Vacant stare.  Slurs words at times.  Not smiling. Reduced enjoyment.  Depression.  Withdrawn from usual activities.  Some irritability.  Anxious. Disc her concerns meds are making her worse.  Extensive discussion about her treatment resistant status.  There is a consistent pattern of not taking the medicines long enough to get benefit because she believes the meds are making her worse.  However the symptoms she describes as side effects are exactly the same symptoms that she had prior to taking the medication RX for  the depression.  So it is not clear that these are actual side effects. This is true about the 2 most recent meds including Seroquel and Auvelity.  Recommend psychiatric consultation in hopes of improving her comfort level with taking prescribed medications for a sufficient length of time to provide benefit. Extensive discussion about ECT is the treatment of choice for treatment resistant depression.  Spravato may work if she can comply with consistency.  There are medication options but they take longer to work.   Plan:  Reduce clonidine to 0.1 mg BID DT bradycardia.  Talk with PCP about BP and low pulse problems which are interfering with her consistent compliance with Spravato.   Limit lorazepam to 3 -4  mg daily max. Excess use is the cause of slurring speech.  She must stop excess use or will have to stop the med. Stop Auvelity per her request.  But she has only been on the full dose for a little over a week and clearly has not had  time to get benefit from it.  She thinks maybe it is making her more  anxious. Reduce Seroquel from 150XR to 50 -100 mg at night IR.  She couldn't sleep when stopped it completely. Will not start new antidepressant until her SE issues are resolved or not. Get second psych opinion from Yehuda Budd MD or another psychiatrist.  H's sister is therapist in [facility] Hoyer, MD, [facility]  05/16/2022 appointment with the following noted: Received Spravato 84 mg today as scheduled.  Tolerated it well without nausea or vomiting headache or chest pain or palpitations.  She stopped Auvelity as discussed last week. On her own, without physician input, she restarted Wellbutrin XL 450 mg every morning today.  She had taken it in the past.  She feels jittery and anxious. She feels less depressed than she did last week.  But she is still depressed without her usual range of affect.  She still is less social and less motivated than normal. Her primary care doctor increased the dose of losartan Plan: Stop Seroquel Reduce Wellbutrin XL to 300 mg every morning.  Starting the dose at 450 every morning is likely causing side effects of jitteriness and it should not be started at that have a dosage. Recommend she not change meds on her own without MDM put  05/23/2022 appointment with the following noted: Received Spravato 84 mg today as scheduled.  Tolerated it well without nausea or vomiting headache or chest pain or palpitations.  Has not dropped seroquel XR 300 mg 1/2 tablet nightly bc couldn't sleep without it. Has not tried lower dose quetiapine 50 mg HS Still feels depressed.   BP is better managed so far, just saw PCP.  BP is better today and infact is low today. Dropped clonidine as directed from 0.3 mg BID bc inadequate control of BP to 0.2 mg BID.  However she wants to increase it back to 0.3 mg twice daily because she feels it helped her anxiety better.  Wonders about increasing Wellbutrin for depression.   However she has only been on 300 mg a day for a week.  She was on 450 mg daily in the past.  06/06/22 appt noted: Received Spravato 84 mg today as scheduled.  Tolerated it well without nausea or vomiting headache or chest pain or palpitations.  She is still depressed and anxious.  She wants to try to stop the Seroquel but cannot sleep without some of it.  She is taking lorazepam 1 mg 4 times daily and still having a lot of anxiety.  She wants to increase clonidine back to 0.3 mg twice daily.  She hopes for more improvement She recently went for a second psychiatric opinion as suggested the results of that are pending.  Past Psychiatric Medication Trials: fluoxetine, duloxetine, Viibryd, lamotrigine, Pristiq, sertraline, citalopram,  Trintellix anxious and SI Wellbutrin XL 450  Adderall, Adderall XR, Vyvanse, Ritalin, Strattera low dose NR Lorazepam Trazodone  Depakote,  lamotrigine cog complaints Lithium remotely Abilify 7.5  Vraylar 1.5 mg daily agitation and insomnia Rexulti insomnia Latuda 40 one dose, CO anxious and SI Seroquel XR 300  At visit November 12, 2019. We discussed Patient developed an increasingly severe alcohol dependence problem since her last visit in January.  She went to [facility] and has had no alcohol since then except 1 day.  She never abused stimulants but they took her off the stimulants at [facility].  Her ADD was markedly worse.  The Wellbutrin did not help the ADD.   Chang history lamotrigine rash at 66 yo  Review of Systems:  Review  of Systems  Constitutional:  Positive for fatigue.  Respiratory:  Negative for shortness of breath.   Cardiovascular:  Negative for chest pain and palpitations.  Musculoskeletal:  Positive for arthralgias, back pain and joint swelling.       SP hip surgery October 2020  Neurological:  Negative for dizziness and light-headedness.  Psychiatric/Behavioral:  Positive for decreased concentration and dysphoric mood.  Negative for behavioral problems, confusion, hallucinations, self-injury, sleep disturbance and suicidal ideas. The patient is nervous/anxious. The patient is not hyperactive.     Medications: I have reviewed the patient's current medications.  Current Outpatient Medications  Medication Sig Dispense Refill  . amLODipine (NORVASC) 2.5 MG tablet Take 2.5 mg by mouth daily.    Marland Kitchen amphetamine-dextroamphetamine (ADDERALL XR) 20 MG 24 hr capsule Take 1 capsule (20 mg total) by mouth every morning. 30 capsule 0  . amphetamine-dextroamphetamine (ADDERALL XR) 20 MG 24 hr capsule Take 1 capsule (20 mg total) by mouth every morning. 30 capsule 0  . buPROPion (WELLBUTRIN XL) 150 MG 24 hr tablet Take 3 tablets (450 mg total) by mouth daily. 90 tablet 0  . cloNIDine (CATAPRES) 0.2 MG tablet Take 0.5 tablets (0.1 mg total) by mouth 2 (two) times daily. 30 tablet 0  . Esketamine HCl, 84 MG Dose, (SPRAVATO, 84 MG DOSE,) 28 MG/DEVICE SOPK USE 3 SPRAYS IN EACH NOSTRIL TWICE A WEEK 3 each 4  . iron polysaccharides (NIFEREX) 150 MG capsule TAKE 1 CAPSULE BY MOUTH EVERY DAY 90 capsule 1  . LORazepam (ATIVAN) 1 MG tablet Take 1 tablet (1 mg total) by mouth every 6 (six) hours as needed for anxiety. 100 tablet 0  . losartan (COZAAR) 50 MG tablet Take 50 mg by mouth daily.    . nebivolol (BYSTOLIC) 2.5 MG tablet Take 2.5 mg by mouth daily.    . QUEtiapine (SEROQUEL XR) 300 MG 24 hr tablet Take 150 mg by mouth at bedtime.    Marland Kitchen QUEtiapine (SEROQUEL) 50 MG tablet Take 1 tablet (50 mg total) by mouth at bedtime. (Patient not taking: Reported on 06/11/2022) 30 tablet 0   No current facility-administered medications for this visit.    Medication Side Effects: None  Allergies:  Allergies  Allergen Reactions  . Metronidazole Shortness Of Breath and Other (See Comments)    Heart pounding  . Ferrlecit [Na Ferric Gluc Cplx In Sucrose] Other (See Comments)    Infusion reaction 05/12/2019    Past Medical History:   Diagnosis Date  . ADHD   . Anemia   . Anxiety   . Arthritis   . Depression   . Heart murmur    i went to see a cardiologit slast eyar  and i had zero plaque,   . PONV (postoperative nausea and vomiting)   . Recovering alcoholic in remission [facility])     Family History  Problem Relation Age of Onset  . Atrial fibrillation Mother   . CAD Father     Social History   Socioeconomic History  . Marital status: Married    Spouse name: Not on file  . Number of children: Not on file  . Years of education: Not on file  . Highest education level: Not on file  Occupational History  . Not on file  Tobacco Use  . Smoking status: Former    Types: Cigarettes    Quit date: 08/16/2003    Years since quitting: 18.8  . Smokeless tobacco: Never  . Tobacco comments:     08-28-2019 "i  smoked 2 cigarettes in the last month since my father  passed"  Vaping Use  . Vaping Use: Never used  Substance and Sexual Activity  . Alcohol use: Yes    Alcohol/week: 10.0 standard drinks of alcohol    Types: 10 Glasses of wine per week  . Drug use: No  . Sexual activity: Not on file  Other Topics Concern  . Not on file  Social History Narrative  . Not on file   Social Determinants of Health   Financial Resource Strain: Not on file  Food Insecurity: Not on file  Transportation Needs: Not on file  Physical Activity: Not on file  Stress: Not on file  Social Connections: Not on file  Intimate Partner Violence: Not on file    Past Medical History, Surgical history, Social history, and Family history were reviewed and updated as appropriate.   Please see review of systems for further details on the patient's review from today.   Objective:   Physical Exam:  There were no vitals taken for this visit.  Physical Exam Constitutional:      General: She is not in acute distress. Neurological:     Mental Status: She is alert and oriented to person, place, and time.     Coordination: Coordination  normal.     Gait: Gait normal.  Psychiatric:        Attention and Perception: Attention and perception normal.        Mood and Affect: Mood is anxious and depressed. Affect is blunt. Affect is not labile, angry, tearful or inappropriate.        Speech: Speech is not rapid and pressured.        Behavior: Behavior is not slowed.        Thought Content: Thought content is not paranoid or delusional. Thought content does not include homicidal or suicidal ideation. Thought content does not include suicidal plan.        Cognition and Memory: Cognition normal. Memory is impaired. She does not exhibit impaired recent memory.        Judgment: Judgment normal.     Comments: Insight intact. No auditory or visual hallucinations. No delusions.  Depression  ongoing. Affect still depressed and more blunted than usual. No Sui intent plan     Lab Review:     Component Value Date/Time   NA 137 01/12/2021 1430   NA 140 11/18/2018 1544   K 3.8 01/12/2021 1430   CL 108 01/12/2021 1430   CO2 22 01/12/2021 1430   GLUCOSE 94 01/12/2021 1430   BUN 14 01/12/2021 1430   BUN 20 11/18/2018 1544   CREATININE 0.82 01/12/2021 1430   CALCIUM 8.9 01/12/2021 1430   PROT 6.6 01/12/2021 1430   ALBUMIN 3.9 01/12/2021 1430   AST 12 (L) 01/12/2021 1430   ALT 11 01/12/2021 1430   ALKPHOS 46 01/12/2021 1430   BILITOT 0.5 01/12/2021 1430   GFRNONAA >60 01/12/2021 1430   GFRAA >60 09/02/2019 0249   GFRAA >60 01/27/2019 0811       Component Value Date/Time   WBC 4.5 01/12/2021 1430   RBC 4.32 01/12/2021 1430   HGB 12.8 01/12/2021 1430   HGB 12.9 07/17/2019 0953   HCT 38.5 01/12/2021 1430   HCT 21.9 (L) 12/25/2018 1221   PLT 272 01/12/2021 1430   PLT 286 07/17/2019 0953   MCV 89.1 01/12/2021 1430   MCH 29.6 01/12/2021 1430   MCHC 33.2 01/12/2021 1430   RDW 12.4 01/12/2021  1430   LYMPHSABS 1.4 01/12/2021 1430   MONOABS 0.4 01/12/2021 1430   EOSABS 0.0 01/12/2021 1430   BASOSABS 0.0 01/12/2021 1430     No results found for: "POCLITH", "LITHIUM"   No results found for: "PHENYTOIN", "PHENOBARB", "VALPROATE", "CBMZ"   .res Assessment: Plan:    Recurrent major depression resistant to treatment ([place])  Generalized anxiety disorder  Attention deficit hyperactivity disorder (ADHD), predominantly inattentive type  Insomnia due to mental condition  Accelerated hypertension   She has treatment resistant major depression at this time.  Have  discussed some of her recent abnormal behaviors leading to this depressive episode getting worse which she says were associated with heavy use of delta 8 and not a manic episode.  She realizes now that that was not good for her.  She stopped all use of other drugs including those available over-the-counter such as delta 8 or any other THC related products.  She is no longer having any of those types of behaviors and instead is depressed. She remains persistently depressed with lack of interest and lack of feeling for things that normally she would have feelings about.  She has low motivation and energy.  She is sad and down.  She is less productive than usual.  Her concentration is poor.  She has high degree of anxiety as well. She has a very negative self-image and low self-esteem.  These are all uncharacteristic for her  Because of the question of cognitive issues we will check TSH, B12 and folate.  Pending She has still not done these blood tests.  Blood pressure was better today so she was able to receive Spravato.  It is markedly lower  Patient was administered Spravato 84 mg intranasally today.  The patient experienced the typical dissociation which gradually resolved over the 2-hour period of observation.  There were no complications.  Specifically the patient did not have nausea or vomiting or headache.  Blood pressures remained within normal ranges at the 40-minute and 2-hour follow-up intervals.  By the time the 2-hour observation period was met  the patient was alert and oriented and able to exit without assistance.  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  See nursing note for further details.  Failed multiple antidepressants.  Many of them were not actual failures but intolerances and it is unclear whether some of that was more connected with anxiety than true side effects.  1 example is the [place].  In general she does not want to try anything but an antidepressant but has failed all major categories of antidepressants except TCAs and MAO inhibitors which have not been tried.  Could consider a TCA but given her manic response to delta 8 there is some concern about the risk of a TCA triggering mania.  It could be combined with lithium but she generally is unfavorable about taking lithium.  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  She wants to continue Spravato.  We discussed discussed the side effects in detail as well as the protocol required to receive Spravato.  S  Has been taking Seroquel XR since 03/20/2022.   Could not sleep when she stopped Seroquel completely so we will reduce to immediate release 50 mg nightly which should still be of helpful for insomnia  continue Wellbutrin XL to  prior dose 450 mg AM  Started Spravato 84 mg twice weekly on 03/16/2022.  But has missed several doses due to elevated blood pressure prior to administration  Adderall  XR 20 mg AM  Ok temporary Ativan 1 mg 3-4 times daily as needed anxiety but try to cut it back. Is not ideal to use benzodiazepine with stimulant but because of the severity of her symptoms it has been necessary.  Hope to eventually eliminate the benzodiazepine.  Expected as her depression improves her anxiety will improve as well.  However lately her  anxiety has been unmanageable.  We will expect that to improve as the depression improves.  She has headed insturctions to reduce this.  PCP managing BP and it is better today Cannot increase clonidine back to 0.3 mg twice daily because currently her blood pressure is low.  We will continue to follow and reevaluate. Continue clonidine 0.2 mg BID  Discussed potential benefits, risks, and side effects of stimulants with patient to include increased heart rate, palpitations, insomnia, increased anxiety, increased irritability, or decreased appetite.  Instructed patient to contact office if experiencing any significant tolerability issues. She wants to return to usual dose of Adderall for ADD bc of mor poor cognitive function with reduction.  Also discussed that depression will impair cognitive function.  Discussed safety plan at length with patient.  Advised patient to contact office with any worsening signs and symptoms.  Instructed patient to go to the [facility] emergency room for evaluation if experiencing any acute safety concerns, to include suicidal intent.  Has Maintained sobriety  FU with Spravato twice weekly  Lynder Parents, MD, DFAPA     Please see After Visit Summary for patient specific instructions.  Future Appointments  Date Time Provider [place]  06/13/2022  9:30 AM Cottle, Billey Co., MD CP-CP None  06/13/2022  9:30 AM CP-NURSE CP-[facility] None           No orders of the defined types were placed in this encounter.      -------------------------------

## 2022-06-12 ENCOUNTER — Other Ambulatory Visit: Payer: Self-pay | Admitting: Psychiatry

## 2022-06-12 DIAGNOSIS — F411 Generalized anxiety disorder: Secondary | ICD-10-CM

## 2022-06-12 DIAGNOSIS — I10 Essential (primary) hypertension: Secondary | ICD-10-CM

## 2022-06-12 MED ORDER — CLONIDINE HCL 0.2 MG PO TABS: 0.2000 mg | ORAL_TABLET | Freq: Two times a day (BID) | ORAL | 1 refills | Status: DC

## 2022-06-13 ENCOUNTER — Ambulatory Visit (INDEPENDENT_AMBULATORY_CARE_PROVIDER_SITE_OTHER): Payer: 59 | Admitting: Psychiatry

## 2022-06-13 ENCOUNTER — Ambulatory Visit: Payer: 59

## 2022-06-13 VITALS — BP 128/80 | HR 62

## 2022-06-13 DIAGNOSIS — F339 Major depressive disorder, recurrent, unspecified: Secondary | ICD-10-CM

## 2022-06-13 DIAGNOSIS — F5105 Insomnia due to other mental disorder: Secondary | ICD-10-CM | POA: Diagnosis not present

## 2022-06-13 DIAGNOSIS — F411 Generalized anxiety disorder: Secondary | ICD-10-CM

## 2022-06-13 DIAGNOSIS — F900 Attention-deficit hyperactivity disorder, predominantly inattentive type: Secondary | ICD-10-CM

## 2022-06-13 DIAGNOSIS — I10 Essential (primary) hypertension: Secondary | ICD-10-CM

## 2022-06-14 NOTE — Telephone Encounter (Signed)
Please send. I will keep it at twice a week for now

## 2022-06-17 NOTE — Progress Notes (Signed)
Nurse visit:   Patient arrived for her 23rd Spravato treatment. Pt is being treated for Treatment Resistant Depression, pt will be receiving 84 mg which will continue to be her maintenance dose, she continues receiving twice weekly as long as her b/p is stable.  Patient arrived and taken to treatment room. Confirmed she had a ride home which is her husband would be coming back to pick up pt when done and sometimes she needs to use [facility] Popper if he is unable to pick her up. Pt's Spravato is ordered through [employer] and delivered to office, all Spravato medication is stored at doctors office per REMS/FDA guidelines. The medication is required to be locked behind two doors per FDA/REMS Protocol. Medication is also disposed of properly per regulations.      Began taking patient's vital signs at 9:45 AM 130/82, pulse 64. Instructed patient to blow her nose then recline back to a 45 degree angle. Gave patient first dose 28 mg nasal spray, each nasal spray administered in each nostril as directed and waited 5 minutes between the second and third dose. All 3 doses given pt did not complain of any nausea/vomiting, given a cup of water due to the taste after the administration of Spravato.  She listens to [employer] with spa or relaxing music.  Checked 40 minute vitals at 10:36 AM, 112/72, pulse was 59. Explained she would be monitored for a total time of 120 minutes. Discharge vitals were taken at 11:42 AM 128/80 P 62. Dr. Clovis Pu met with her and discussed her medication and made a couple changes.   I walked pt to elevator, where her husband met her for the ride home. Recommend she go home and sleep or just relax on the couch. No driving, no intense activities. Verbalized understanding. Nurse was with pt a total of 70 minutes for clinical. Pt is coming back in on Monday, August 7th. We will continue twice weekly at this time.  Pt instructed to call office with any problems or questions.      LOT [id number] EXP APR  2025

## 2022-06-18 ENCOUNTER — Ambulatory Visit (INDEPENDENT_AMBULATORY_CARE_PROVIDER_SITE_OTHER): Payer: 59 | Admitting: Psychiatry

## 2022-06-18 ENCOUNTER — Encounter: Payer: Self-pay | Admitting: Psychiatry

## 2022-06-18 ENCOUNTER — Ambulatory Visit: Payer: 59

## 2022-06-18 VITALS — BP 131/85 | HR 59

## 2022-06-18 DIAGNOSIS — F411 Generalized anxiety disorder: Secondary | ICD-10-CM | POA: Diagnosis not present

## 2022-06-18 DIAGNOSIS — F5105 Insomnia due to other mental disorder: Secondary | ICD-10-CM | POA: Diagnosis not present

## 2022-06-18 DIAGNOSIS — F339 Major depressive disorder, recurrent, unspecified: Secondary | ICD-10-CM

## 2022-06-18 DIAGNOSIS — F900 Attention-deficit hyperactivity disorder, predominantly inattentive type: Secondary | ICD-10-CM | POA: Diagnosis not present

## 2022-06-18 DIAGNOSIS — I10 Essential (primary) hypertension: Secondary | ICD-10-CM

## 2022-06-18 NOTE — Progress Notes (Signed)
Laura Chang [id number] 1956/08/01 66 y.o.  Subjective:   Patient ID:  Laura Chang is a 66 y.o. (DOB Feb 15, 1956) female.  Chief Complaint:  Chief Complaint  Patient presents with   Follow-up   Depression   Anxiety   Fatigue   ADD     HPI Laura Chang presents to the office today for follow-up of depression and anxiety and ADD.  seen November 12, 2019.  Melted down in 2020.  Went to [employer] in July.  No withdrawal.  1 drink since then.  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 She was on no stimulant and no SSRI but was taking Strattera and Wellbutrin.  The following changes were made. Stop Strattera. OK restart stimulant bc severe ADD Restart Adderall 1 daily for a few days and if tolerated then restart 1 twice daily. If not tolerated reduce the dosage if needed. May need to stop Wellbutrin if not tolerating the stimulant.  Yes.  DC Wellbutrin Restart Prozac 20 mg daily.  February 2021 appointment with the following noted: Completed grant proposal.  Couldn't doit without Adderall.  Sold a bunch of work.   Adderall XR lasts about 3 pm.  Strength seems about right.  BP been OK.  Not jittery.   Stopped Wellbutrin but had no SE. Mood drastically better with grant proposal and back on fluoxetine.  Less depressed and lethargic.  No anxiety.  Cut back on coffee. Started back with devotions and stronger faith. Plan: Continue Prozac 20 mg daily. May have to increase the dose at some point in the future given that she usually was taking higher dosages but she is getting good response at this time. Restart Wellbutrin off label for ADD since can't get 2 ADDERALL daily. 150 mg daily then 300 mg daily. She can adjust the dose between 150 mg and 300 mg daily to get the optimal effect.   05/11/2020 appointment with the following noted: Has been inconsistent with Prozac and Wellbutrin. Not sure of the effect of Wellbutrin. Biggest deterrent in work is  anxiety.  Some of the work is conceptual and difficult at times.  Can feel she's not up to a project at times.  Overall is OK but would like a steadier benefit from stimulant.  Exhausted from managing concentration and keeping up with things from the day.  Loses things.  Not good keeping up with schedule. Overall productive and emotionally OK. Can feel Adderall wear off. Mood is better in summer and worse in the winter.   F died in 2023-10-04 and that is a loss. No SE Wellbutrin. Still attends AA meetings.  Real benefit from [place] last year. Recognizes effect of anemia on ADD and mood.  Had iron infusions last winter. Plan:  Wellbutrin off label for ADD since can't get 2 ADDERALL daily. 150 mg daily then 300 mg daily.  01/24/2021 appointment with following noted: Doing a program called [employer] mindfulness app since Xmas.  CBT app helped the depression.  App helped her focus better.  Lost sign weight. Writing a lot. Before Xmas felt depressed and started negative thinking worse, self denigrating. Not drinking. More isolated.   Recognizes mo is narcissist.    Didn't tell anyone she was born until 3 mos later.  M aloof and uninterested in pt.  Lied about her birthday.  Mo lack of affection even with pt's kids. Going to [employer] for a year and it helped her to quit drinking. Also misses  kids being gone with a hole also. Plan: No med changes  05/04/2021 appointment with the following noted: Therapist Paula Pile thinks she's manic. Lost weight to 144#.   States she is still sleeping okay.  Admits she is hyper and recognizes that she is likely manic.  She feels great, euphoric with an increased sense of spiritual connectedness to God.  She has racing thoughts and talks fast and talks a lot and this is noted by her husband.  He thinks she is a bit hyper.  She has been able to maintain sobriety although she will have 1 glass of wine on special occasions but does not drink by herself.  She is not  drinking to excess.  She denies any dangerous impulsivity.  She is clearly not depressed and not particularly anxious.  She has no concerns about her medication and she has been compliant.  06/16/21 appt noted: So much better.  Going through a lot but the manic thing happened on top of it.  So much slower.  Didn't feel like losing anything with risperidone.  Likes the Adderalll at 10 mg. Some drowsiness in the AM and very drowsy from risperidone 2 mg HS. Prayer life is better. Handling stress better. Less depressed with risperidone. Still likes trazodone. Sleeps well. Plan: Reduce Prozac to 10 mg daily.  Consider stopping it because it can feel the mania however she is reluctant to do that because she fears relapse of depression. Reduce risperidone to 1.5 mg nightly due to side effects.  Discussed risk of worsening mania.  07/25/2021 appointment with the following noted: Misses the Adderall and hard to function without it. Depressed now. Heavy chest.  Anxious and guilty.  Body feels heavy.   Hates Wellbutrin.   Plan: Increase fluoxetine to 20 mg daily Add Abilify 1/2 of 15 mg tablet daily Wean wellbutrin by 1 tablet each week  bc she feels it is not helpful and DT polypharmacy Reduce risperidone to 1 daily for 1 week and stop it. Disc risk of mania. Increase Adderall to XR 20 mg AM  08/08/21 Much less depressed and starting to feel normal I feel a lot better. No SE.  Speech normal off risperidone. Sleeping OK on trazaodone and enough.   Noticed benefit from Adderall again. Plan: continue fluoxetine to 20 mg daily Continue Abilify 1/2 of 15 mg tablet daily for depression and mania continue Adderall to XR 20 mg AM  10/10/2021 phone call: Pt stated she feels like the Abilify should be decreased to 5mg.She said she is depressed but rational and not suicidal.She has an appt Monday and can wait until then if you prefer. MD response: Reduce the Abilify to 7.5 mg every other day.  We will meet on  10/16/2021 and decide what to do from there.  10/16/2021 appointment with the following noted: More depressed.  Most depressed I've ever been.  Just numb.  Sense of grief.   Thinks the manic episode was unlike anything else she ever had.  Doesn't want to medicate against it.  Don't enjoy people.  Easily overwhelmed.  Had some death thoughts but not suicidal.  Has been functional.  Feels better today after reducing Abilify to every other day but she is only been doing that for 3 days. A/P: Episode of post manic depression was explained. continue fluoxetine to 20 mg daily Hold Abilify for 1 week then resume Abilify 1/2 of 15 mg tablet every other day for depression and mania continue Adderall to XR 20 mg   AM  10/27/2021 appointment with the following noted: I'm doing so much better.  Handling the depression better. Better self talk and spiritual focus has helped.   Dep 6/10 manifesting as anxiety with low confidence.   F died 2  years ago and M 66 yo and is dependent . She is working hard to feel better but still feels depressed.  She almost feels like she has a little more anxiety since restarting Abilify every other day. Plan: continue fluoxetine to 20 mg daily DC Abilify .  Vrayalar 1.5 mg QOD to try to get rid of depression ASAP. continue Adderall to XR 20 mg AM  11/10/2021 appointment with the following noted: Busy with Xmas and it was fun with family but then a big let down.  Did well with it.  Functioned well with it.  Working hard on things with depression.  Not shutting down. Not sure but feels better today but yesterday was hard.  Difficulty dealing with mother.  She won't do anything to help herself.  Yesterday with her all day.  Won't do PT and has isolated herself.    Lack of confidence.   No SE with Vraylar.  11/24/21 urgent appointment appt noted: More and more depressed.   So anxious and doesn't want to be alone but can do so. No appetite. Hurts inside. Has had some fleeting  suicidal thoughts but would not act on them.  Tolerating meds. Has been consistent with Vraylar 1.5 mg every other day, fluoxetine 20 mg daily Plan: Increase Vraylar to 1.5 mg daily Change Prozac to Trintellix 10 mg daily. Discussed side effects of each continue Adderall to XR 20 mg AM  12/27/2021 appointment with the following noted: Not OK.  I feel less depressed but feels bat shit. Not sleeping well.  Extremely anxious. Off and on sleep. 3-4 hours of sleep.   Still having daily SI.  But also become obvious has so much to do.  Overwhelmed by tasks.   Needs anxiety meds to just function. Not more motivated.  Walked yesterday.   Feels afraid like in trouble but not irritable or angry. DC DT agitation Vraylar to 1.5 mg daily Change Prozac to Trintellix 10 mg daily. Hold Adderall to XR 20 mg AM Clonidine 0.1 1/2 tablet twice daily for 2 days and if needed for anxiety and sleep increase to 1 twice daily Ok temporary Ativan 1 mg 3 times daily as needed anxiety  01/05/22 appt noted: Off fluoxetine and  Trintellix.  Only on Ativan, trazodone and Adderall XR 20 plus added clonidine 0.1 mg BID Didn't think she needed to start Trintellix. Not taking Ativan.   Didn't like herself last week. Feels some better today. Wonders if the manic sx Not agitated.  Anxiety kind of calmed down.  A lot to be anxious about situationally.  $ stress. Concerns about downers with meds. Can't access normal personality. ? Lethargy and inability to talk as sE. Plan: Latuda 20-40 mg daily with food. Adderall to XR 20 mg AM Clonidine 0.1 1/2 tablet twice daily  reduce dose to be sure no SE Ok temporary Ativan 1 mg 3 times daily as needed anxiety  01/19/22 appt noted: Taking Latuda 20 mg daily.  Took 40 mg once and felt anxious and  SI Still depressed and not very reactive Anxiety mainly about the depression and fears of the future. She wants to revisit manic sx and thinks it was maybe bc taking delta 8 bc was  taking a lot of   it so still doesn't think she's classic bipolar. She wants to only take Prozac bc thinks Latuda is perpetuating depression. Says the delta 8 was very psychaedelic.  When not taking it was not manic.  Sleeping ok again.  Plan: Per her request DC Latuda 20-40 mg daily with food. She wants to continue Prozac alone AMA  Adderall to XR 20 mg AM Clonidine 0.1 1/2 tablet twice daily  reduce dose to be sure no SE Ok temporary Ativan 1 mg 3 times daily as needed anxiety  01/23/2022 phone call complaining of increased anxiety since stopping Latuda.  She will try increasing clonidine.  01/26/2022 phone call not feeling well and wanted to restart the Vraylar.  However notes indicate that had made her agitated therefore she was encouraged to pick up samples of Rexulti 1 mg and start that instead.  02/06/2022 phone call: Stating she felt the Rexulti was helping with depression but she was not sleeping well and obsessing over things.  She was encouraged to increase Rexulti to 2 mg daily and increase trazodone for sleep.  02/09/2022 appointment with the following noted: This was an urgent work in appointment No sleep last night with trazodone 100 mg HS Nothing really better depression or anxiety. Ruminating negative anxious thoughts. Did not tolerate Rexulti because it was causing insomnia.  Does not think it helped depression.  Lacks emotion that she should have.  Lacks her usual personality.  Some hopeless thoughts.  Some death thoughts.  Some suicidal thoughts without plan or intent Plan: DC Rexulti and Prozac & DC trazodone Adderall to XR 20 mg AM Clonidine 0.1 1/tablet twice daily  reduce dose to be sure no SE Ok temporary Ativan 1 mg 3 times daily as needed anxiety Start Seroquel XR 150 mg nightly  03/02/2022 appointment: Laura Chang called back a few days after starting Seroquel stating it was making her more anxious and more depressed.  This seemed unlikely as this medicine rarely ever  causes anxiety.  She stopped the medication waited 3 days and called back still had anxiety and depression but thought perhaps the anxiety was a little better.  She did not want to take the Seroquel. She knew about the option of Spravato and wanted to pursue that. Now questions whether to return to Seroquel while waiting to start Spravato bc feels just as bad without it and knows she didn't give it enough time to work.   MADRS 46  ECT-MADRS    Flowsheet Row Clinical Support from 05/21/2022 in [place] Visit from 03/02/2022 in [facility]  MADRS Total Score 27 46      03/14/22 appt noted: Pt received Spravato 56 mg first dose today with some dissociative sx which were not severe.  She was anxious prior to the administration and felt better after receiving lorazepam 1 mg.  No NV, or HA. Wants to continue Spravato. Ongoing depression and desperate to feel better.  I'm not myself DT deprsssion which is most severe in recent history.  Anhedonia.  Low motivation.  Social avoidance. Continues to think all recent med trials are making her worse.  Sleep ok with Seroquel.  03/16/22 appt noted: Received Spravato 84 mg for the first time.  some dissociative sx which were not severe.  She was anxious prior to the administration and felt better after receiving lorazepam 1 mg.  No NV, or HA. Wants to continue Spravato.   Does not feel any better or different since the last appt.  Ongoing  depression.  Ongoing depression and desperate to feel better.  I'm not myself DT deprsssion which is most severe in recent history.  Anhedonia.  Low motivation.  Social avoidance. Continues to think all recent med trials are making her worse.  Sleep ok with Seroquel.  Does not want to continue Seroquel for TRD.  03/20/2022 appointment noted: Came for Spravato administration today.  However blood pressure was significantly elevated approximately 180/115.  She was given lorazepam 1 mg and  clonidine 0.2 mg to try to get it down. She states she regretted stopping the Seroquel XR 300 mg tablets.  She now realizes it was helpful.  She did not sleep much at all last night.  She did not take the Adderall this morning. 2 to 3 hours after arrival blood pressure was still elevated at  170/110, 62 pulse.  For Spravato administration was canceled for today.  She admits to being anxious and depressed.  She is not suicidal.  She is highly motivated to receive the Spravato.  We discussed getting it tomorrow.  03/22/2022 appointment noted: Patient's blood pressure was never stable enough yesterday in order to get her in for Spravato administration.  She was encouraged to see her primary care doctor.  It is better today.  03/26/2022 appointment with the following noted: Blood pressure was better.  Saw her primary care doctor who started on oral Bystolic 2.5 mg daily. Received Spravato 84 mg today as scheduled.  Tolerated it well without nausea or vomiting headache or chest pain or palpitations.  Her blood pressure was borderline but manageable. She remains depressed and anxious.  She is ambivalent about the medicine and desperate to get to feel better.  Continues to have anhedonia and low energy and low motivation and reduced ability to do things.  Less social.  Not suicidal.  03/28/22 appt noted: Received Spravato 84 mg today as scheduled.  Tolerated it well without nausea or vomiting headache or chest pain or palpitations.  Her blood pressure was borderline but manageable. Has not seen any improvement so far.  Tolerating Seroquel.  Inconsistent with Bystolic and BP has been borderline high. Still depressed and anxious and anhedonia.  Low motivation, energy, productivity. Taking quetiapine and tolerating XR 300 mg nightly.  04/04/22 appt noted: Received Spravato 84 mg today as scheduled.  Tolerated it well without nausea or vomiting headache or chest pain or palpitations.  Her blood pressure was  borderline but manageable. Has not seen any improvement so far.  Tolerating Seroquel.   She still tends to think that the medications are making her worse.  She has said this about each of the recent psychiatric medicines including Seroquel.  However her husband thinks she is improved.  She also admits there is some improvement in productivity.  She still feels highly anxious.  She still does not enjoy things as normal.  She still feels desperate to improve as soon as possible. Has been taking Seroquel XR since 03/20/2022  04/10/22 appt noted: Received Spravato 84 mg today as scheduled.  Tolerated it well without nausea or vomiting headache or chest pain or palpitations.  Her blood pressure was borderline but manageable. Has not seen any improvement so far.  Tolerating Seroquel.  Doesn't like Seroquel bc she thinks it flattens here. 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  Tolerated it well without nausea or vomiting headache or chest  pain or palpitations.  Her blood pressure was borderline but manageable. Has not seen any improvement so far.  Tolerating Seroquel.  Doesn't like Seroquel bc she thinks it flattens her. Received Spravato 84 mg today as scheduled.  Tolerated it well without nausea or vomiting headache or chest pain or palpitations.  Her blood pressure was borderline but manageable. Has not seen any improvement so far.  Tolerating Seroquel.  Doesn't like Seroquel bc she thinks it flattens here.  We discussed her ambivalence about it. She is starting Auvelity and has tolerated it the last 2 days without side effect.  She still does not feel like herself and feels flat and not enjoying things with suppressed expressed emotion  04/17/2022 appointment with the following noted: Received Spravato 84 mg today as scheduled.   Tolerated it well without nausea or vomiting headache or chest pain or palpitations.  Her blood pressure was borderline but manageable. Has not seen any improvement so far.  Tolerating Seroquel.  Doesn't like Seroquel bc she thinks it flattens her. She has been tolerating the Auvelity 1 in the morning without side effects for about a week.  She has not noticed significant improvement so far.  She still feels depressed and flat and not herself.  Other people notice that she is flat emotionally.  She is not suicidal.  She does feel discouraged that she is not getting better yet.  04/19/2022 appointment noted: Has increased Auvelity to 1 twice daily for 2 days, continues quetiapine XR 300 mg nightly, clonidine 0.3 mg twice daily, lorazepam 1 mg twice daily for anxiety and Adderall XR 20 mg in the morning. No obious SE but she still thinks quetiapine XR is making her feel down.  But not sedated Received Spravato 84 mg today as scheduled.  Tolerated it well without nausea or vomiting headache or chest pain or palpitations.  Her blood pressure was borderline but manageable. She still feels quite anxious and feels it necessary to take both the clonidine and lorazepam twice a day to manage her anxiety.  She has been consistently down and flat and not herself until yesterday afternoon she noted an improvement in mood and feeling much more like herself with her normal personality reemerging.  She was quite depressed in the morning with very dark negative thoughts.  She did not have those dark negative thoughts this morning.  She had a lot of questions about medication and when she was expecting to be improved and why she has not shown improvement up to now.  04/23/22 appt noted: Has increased Auvelity to 1 twice daily for 1 week, continues quetiapine XR 300 mg nightly, clonidine 0.3 mg twice daily, lorazepam 1 mg twice daily for anxiety and Adderall XR 20 mg in the morning. No obious SE but she still thinks  quetiapine XR is making her feel down.  But not sedated Received Spravato 84 mg today as scheduled.  Tolerated it well without nausea or vomiting headache or chest pain or palpitations.  She is still depressed but admits better function and is able to enjoy social interactions. Tolerating meds.  Would like to feel better for sure. Not herself.  Flat. Plan increase Auvelity to 1 tab BID as planned and reduce Quetiapine to 1/2 of ER 300 mg  bc NR for depression.  04/25/2022 appointment with the following noted: clonidine 0.3 mg twice daily, lorazepam 1 mg twice daily for anxiety and Adderall XR 20 mg in the morning. Seroquel XR 300 HS No obious SE but  she still thinks quetiapine XR is making her feel down.  But not sedated Received Spravato 84 mg today as scheduled.  Tolerated it well without nausea or vomiting headache or chest pain or palpitations.  Called yesterday with more anxiety.  Had increased Auvelity for 1 day and reduced Seroquel XR for 1 day.  Felt restless and fearful  05/01/2022 appointment noted: clonidine 0.3 mg twice daily, lorazepam 1 mg twice daily for anxiety and Adderall XR 20 mg in the morning. Seroquel XR 150 HS, Auvelity 1 BID Received Spravato 84 mg today as scheduled.  Tolerated it well without nausea or vomiting headache or chest pain or palpitations.  Nurse has noted patient has called multiple times sometimes asking the same question repeatedly.  It is unclear whether she is truly forgetful or is just anxious seeking reassurance. Patient acknowledges ongoing depression as well as some anxiety but states she has felt a little better in the last couple of days.  She has reduced the Seroquel to 150 mg at night and has increased Auvelity to 1 twice daily but only for 1 day.  So far she seems to be tolerating it.  05/03/22 appt noted: clonidine 0.2 mg twice daily, lorazepam 1 mg twice daily for anxiety and Adderall XR 20 mg in the morning. Seroquel XR 150 HS, Auvelity 1 BID BP  high this am about 170/100 and received extra clonidine 0.2 mg and came to receive Spravato.  Not dizzy, no SOB, nor CP but BP is still high Could not receive Spravato today bc BP high and pulse low at 30 ppm. Still depressed and anxious. Plan: continue trial Auvelity with Spravato She needs to get BP and pulse managed  05/08/22 TC: RTC  H Michael NA and mailbox full.  Could not leave message.  Pt  -  talked to she and H on speaker. H worried over wife.  Vacant stare.  Slurs words at times.  Not smiling. Reduced enjoyment.  Depression.  Withdrawn from usual activities.  Some irritability.  Anxious. Disc her concerns meds are making her worse.  Extensive discussion about her treatment resistant status.  There is a consistent pattern of not taking the medicines long enough to get benefit because she believes the meds are making her worse.  However the symptoms she describes as side effects are exactly the same symptoms that she had prior to taking the medication RX for  the depression.  So it is not clear that these are actual side effects. This is true about the 2 most recent meds including Seroquel and Auvelity.  Recommend psychiatric consultation in hopes of improving her comfort level with taking prescribed medications for a sufficient length of time to provide benefit. Extensive discussion about ECT is the treatment of choice for treatment resistant depression.  Spravato may work if she can comply with consistency.  There are medication options but they take longer to work.   Plan:  Reduce clonidine to 0.1 mg BID DT bradycardia.  Talk with PCP about BP and low pulse problems which are interfering with her consistent compliance with Spravato.   Limit lorazepam to 3 -4  mg daily max. Excess use is the cause of slurring speech.  She must stop excess use or will have to stop the med. Stop Auvelity per her request.  But she has only been on the full dose for a little over a week and clearly has not had  time to get benefit from it.  She thinks maybe it  is making her more anxious. Reduce Seroquel from 150XR to 50 -100 mg at night IR.  She couldn't sleep when stopped it completely. Will not start new antidepressant until her SE issues are resolved or not. Get second psych opinion from Yehuda Budd MD or another psychiatrist.  H's sister is therapist in [place], MD, [facility]  05/16/2022 appointment with the following noted: Received Spravato 84 mg today as scheduled.  Tolerated it well without nausea or vomiting headache or chest pain or palpitations.  She stopped Auvelity as discussed last week. On her own, without physician input, she restarted Wellbutrin XL 450 mg every morning today.  She had taken it in the past.  She feels jittery and anxious. She feels less depressed than she did last week.  But she is still depressed without her usual range of affect.  She still is less social and less motivated than normal. Her primary care doctor increased the dose of losartan Plan: Stop Seroquel Reduce Wellbutrin XL to 300 mg every morning.  Starting the dose at 450 every morning is likely causing side effects of jitteriness and it should not be started at that have a dosage. Recommend she not change meds on her own without MDM put  05/23/2022 appointment with the following noted: Received Spravato 84 mg today as scheduled.  Tolerated it well without nausea or vomiting headache or chest pain or palpitations.  Has not dropped seroquel XR 300 mg 1/2 tablet nightly bc couldn't sleep without it. Has not tried lower dose quetiapine 50 mg HS Still feels depressed.   BP is better managed so far, just saw PCP.  BP is better today and infact is low today. Dropped clonidine as directed from 0.3 mg BID bc inadequate control of BP to 0.2 mg BID.  However she wants to increase it back to 0.3 mg twice daily because she feels it helped her anxiety better.  Wonders about increasing Wellbutrin for depression.   However she has only been on 300 mg a day for a week.  She was on 450 mg daily in the past.  06/06/22 appt noted: Received Spravato 84 mg today as scheduled.  Tolerated it well without nausea or vomiting headache or chest pain or palpitations.  She is still depressed and anxious.  She wants to try to stop the Seroquel but cannot sleep without some of it.  She is taking lorazepam 1 mg 4 times daily and still having a lot of anxiety.  She wants to increase clonidine back to 0.3 mg twice daily.  She hopes for more improvement She recently went for a second psychiatric opinion as suggested the results of that are pending.  06/11/22 appt noted: Received Spravato 84 mg today as scheduled.  Tolerated it well without nausea or vomiting headache or chest pain or palpitations.  She is still depressed and anxious. Without much change.  Still hopeless, anhedonia, reduced inteterest and motivation.  Tolerating meds. Disc concerns Spravato is not hleping much. Plan: stop Seroquel and start olanzapine 10 mg HS for TRD and anxiety.  06/13/2022 appointment noted: Received Spravato 84 mg today as scheduled.  Tolerated it well without nausea or vomiting headache or chest pain or palpitations.  She is still depressed and anxious. Without much change.  Still hopeless, anhedonia, reduced inteterest and motivation.  Tolerating meds. Disc concerns Spravato is not helping much as hoped but is improving a bit in the last week. Tolerating meds. Continues Wellbutrin XL 450 AM, tolerating recently started olanzapine  10 mg HS. Sleep is good.   Pending appt with [place] consult.  Past Psychiatric Medication Trials: fluoxetine, duloxetine, Viibryd, lamotrigine, Pristiq, sertraline, citalopram,  Trintellix anxious and SI Wellbutrin XL 450  Adderall, Adderall XR, Vyvanse, Ritalin, Strattera low dose NR Lorazepam Trazodone  Depakote,  lamotrigine cog complaints Lithium remotely Abilify 7.5  Vraylar 1.5 mg daily agitation  and insomnia Rexulti insomnia Latuda 40 one dose, CO anxious and SI Seroquel XR 300  At visit November 12, 2019. We discussed Patient developed an increasingly severe alcohol dependence problem since her last visit in January.  She went to [facility] and has had no alcohol since then except 1 day.  She never abused stimulants but they took her off the stimulants at [facility].  Her ADD was markedly worse.  The Wellbutrin did not help the ADD.   D history lamotrigine rash at 66 yo  Review of Systems:  Review of Systems  Constitutional:  Positive for fatigue.  Cardiovascular:  Negative for palpitations.  Musculoskeletal:  Positive for arthralgias, back pain and joint swelling.       SP hip surgery October 2020  Neurological:  Negative for dizziness and light-headedness.  Psychiatric/Behavioral:  Positive for decreased concentration and dysphoric mood. Negative for agitation, behavioral problems, confusion, hallucinations, self-injury, sleep disturbance and suicidal ideas. The patient is nervous/anxious. The patient is not hyperactive.     Medications: I have reviewed the patient's current medications.  Current Outpatient Medications  Medication Sig Dispense Refill   amLODipine (NORVASC) 2.5 MG tablet Take 2.5 mg by mouth daily.     amphetamine-dextroamphetamine (ADDERALL XR) 20 MG 24 hr capsule Take 1 capsule (20 mg total) by mouth every morning. 30 capsule 0   amphetamine-dextroamphetamine (ADDERALL XR) 20 MG 24 hr capsule Take 1 capsule (20 mg total) by mouth every morning. 30 capsule 0   buPROPion (WELLBUTRIN XL) 150 MG 24 hr tablet Take 3 tablets (450 mg total) by mouth daily. 90 tablet 0   cloNIDine (CATAPRES) 0.2 MG tablet Take 1 tablet (0.2 mg total) by mouth 2 (two) times daily. 60 tablet 1   Esketamine HCl, 84 MG Dose, (SPRAVATO, 84 MG DOSE,) 28 MG/DEVICE SOPK USE 3 SPRAYS IN EACH NOSTRIL TWICE WEEKLY 3 each 5   iron polysaccharides (NIFEREX) 150 MG capsule TAKE 1  CAPSULE BY MOUTH EVERY DAY 90 capsule 1   LORazepam (ATIVAN) 1 MG tablet Take 1 tablet (1 mg total) by mouth every 6 (six) hours as needed for anxiety. 100 tablet 0   losartan (COZAAR) 50 MG tablet Take 50 mg by mouth daily.     nebivolol (BYSTOLIC) 2.5 MG tablet Take 2.5 mg by mouth daily.     OLANZapine (ZYPREXA) 10 MG tablet Take 1 tablet (10 mg total) by mouth at bedtime. 30 tablet 0   No current facility-administered medications for this visit.    Medication Side Effects: None  Allergies:  Allergies  Allergen Reactions   Metronidazole Shortness Of Breath and Other (See Comments)    Heart pounding   Ferrlecit [Na Ferric Gluc Cplx In Sucrose] Other (See Comments)    Infusion reaction 05/12/2019    Past Medical History:  Diagnosis Date   ADHD    Anemia    Anxiety    Arthritis    Depression    Heart murmur    i went to see a cardiologit slast eyar  and i had zero plaque,    PONV (postoperative nausea and vomiting)    Recovering alcoholic  in remission [facility])     Family History  Problem Relation Age of Onset   Atrial fibrillation Mother    CAD Father     Social History   Socioeconomic History   Marital status: Married    Spouse name: Not on file   Number of children: Not on file   Years of education: Not on file   Highest education level: Not on file  Occupational History   Not on file  Tobacco Use   Smoking status: Former    Types: Cigarettes    Quit date: 08/16/2003    Years since quitting: 18.8   Smokeless tobacco: Never   Tobacco comments:     08-28-2019 "i smoked 2 cigarettes in the last month since my father  passed"  Vaping Use   Vaping Use: Never used  Substance and Sexual Activity   Alcohol use: Yes    Alcohol/week: 10.0 standard drinks of alcohol    Types: 10 Glasses of wine per week   Drug use: No   Sexual activity: Not on file  Other Topics Concern   Not on file  Social History Narrative   Not on file   Social Determinants of Health    Financial Resource Strain: Not on file  Food Insecurity: Not on file  Transportation Needs: Not on file  Physical Activity: Not on file  Stress: Not on file  Social Connections: Not on file  Intimate Partner Violence: Not on file    Past Medical History, Surgical history, Social history, and Family history were reviewed and updated as appropriate.   Please see review of systems for further details on the patient's review from today.   Objective:   Physical Exam:  There were no vitals taken for this visit.  Physical Exam Constitutional:      General: She is not in acute distress. Neurological:     Mental Status: She is alert and oriented to person, place, and time.     Coordination: Coordination normal.     Gait: Gait normal.  Psychiatric:        Attention and Perception: Attention and perception normal.        Mood and Affect: Mood is anxious and depressed. Affect is blunt. Affect is not labile, angry, tearful or inappropriate.        Speech: Speech is not rapid and pressured or slurred.        Behavior: Behavior is not slowed.        Thought Content: Thought content is not paranoid or delusional. Thought content does not include homicidal or suicidal ideation. Thought content does not include suicidal plan.        Cognition and Memory: Cognition normal. Memory is impaired. She does not exhibit impaired recent memory.        Judgment: Judgment normal.     Comments: Insight intact. No auditory or visual hallucinations. No delusions.  Depression  ongoing. Affect still depressed and more blunted than usual. No Sui intent plan      Lab Review:     Component Value Date/Time   NA 137 01/12/2021 1430   NA 140 11/18/2018 1544   K 3.8 01/12/2021 1430   CL 108 01/12/2021 1430   CO2 22 01/12/2021 1430   GLUCOSE 94 01/12/2021 1430   BUN 14 01/12/2021 1430   BUN 20 11/18/2018 1544   CREATININE 0.82 01/12/2021 1430   CALCIUM 8.9 01/12/2021 1430   PROT 6.6 01/12/2021 1430    ALBUMIN 3.9 01/12/2021  1430   AST 12 (L) 01/12/2021 1430   ALT 11 01/12/2021 1430   ALKPHOS 46 01/12/2021 1430   BILITOT 0.5 01/12/2021 1430   GFRNONAA >60 01/12/2021 1430   GFRAA >60 09/02/2019 0249   GFRAA >60 01/27/2019 0811       Component Value Date/Time   WBC 4.5 01/12/2021 1430   RBC 4.32 01/12/2021 1430   HGB 12.8 01/12/2021 1430   HGB 12.9 07/17/2019 0953   HCT 38.5 01/12/2021 1430   HCT 21.9 (L) 12/25/2018 1221   PLT 272 01/12/2021 1430   PLT 286 07/17/2019 0953   MCV 89.1 01/12/2021 1430   MCH 29.6 01/12/2021 1430   MCHC 33.2 01/12/2021 1430   RDW 12.4 01/12/2021 1430   LYMPHSABS 1.4 01/12/2021 1430   MONOABS 0.4 01/12/2021 1430   EOSABS 0.0 01/12/2021 1430   BASOSABS 0.0 01/12/2021 1430    No results found for: "POCLITH", "LITHIUM"   No results found for: "PHENYTOIN", "PHENOBARB", "VALPROATE", "CBMZ"   .res Assessment: Plan:    Recurrent major depression resistant to treatment ([place])  Generalized anxiety disorder  Attention deficit hyperactivity disorder (ADHD), predominantly inattentive type  Insomnia due to mental condition  Accelerated hypertension   She has treatment resistant major depression at this time.  Have  discussed some of her recent abnormal behaviors leading to this depressive episode getting worse which she says were associated with heavy use of delta 8 and not a manic episode.  She realizes now that that was not good for her.  She stopped all use of other drugs including those available over-the-counter such as delta 8 or any other THC related products.  She is no longer having any of those types of behaviors and instead is depressed. She remains persistently depressed with lack of interest and lack of feeling for things that normally she would have feelings about.  She has low motivation and energy.  She is sad and down.  She is less productive than usual.  Her concentration is poor.  She has high degree of anxiety as well. She has a  very negative self-image and low self-esteem.  These are all uncharacteristic for her  Because of the question of cognitive issues we will check TSH, B12 and folate.  Pending She has still not done these blood tests.  Blood pressure has become more normal, so she was able to receive Spravato.   Patient was administered Spravato 84 mg intranasally today.  The patient experienced the typical dissociation which gradually resolved over the 2-hour period of observation.  There were no complications.  Specifically the patient did not have nausea or vomiting or headache.  Blood pressures remained within normal ranges at the 40-minute and 2-hour follow-up intervals.  By the time the 2-hour observation period was met the patient was alert and oriented and able to exit without assistance.  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  See nursing note for further details.  Failed multiple antidepressants.  Many of them were not actual failures but intolerances and it is unclear whether some of that was more connected with anxiety than true side effects.  1 example is the [place].  In general she does not want to try anything but an antidepressant but has failed all major categories of antidepressants except TCAs and MAO inhibitors which have not been tried.  Could consider a TCA but given her manic response to delta 8 there is some  concern about the risk of a TCA triggering mania.  It could be combined with lithium but she generally is unfavorable about taking lithium.  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  She wants to continue Spravato though limited improvement so far.  We discussed discussed the side effects in detail as well as the protocol required to receive Spravato.     Started olanzapine 10 mg HS on 7.31. 23 and stopped Seroquel without px so far. Discussed potential metabolic side effects associated with atypical antipsychotics, as well as potential risk for movement side effects. Advised pt to contact office if movement side effects occur.   continue Wellbutrin XL to prior dose 450 mg AM  Started Spravato 84 mg twice weekly on 03/16/2022.  But has missed several doses due to elevated blood pressure prior to administration  Adderall  XR 20 mg AM  Ok temporary Ativan 1 mg 3-4 times daily as needed anxiety but try to cut it back. Is not ideal to use benzodiazepine with stimulant but because of the severity of her symptoms it has been necessary.  Hope to eventually eliminate the benzodiazepine.  Expected as her depression improves her anxiety will improve as well.  However lately her anxiety has been unmanageable.  We will expect that to improve as the depression improves.  She has headed insturctions to reduce this.  Continue clonidine 0.2 mg BID off label for anxiety and helps BP partially. BP is better controlled.  Discussed potential benefits, risks, and side effects of stimulants with patient to include increased heart rate, palpitations, insomnia, increased anxiety, increased irritability, or decreased appetite.  Instructed patient to contact office if experiencing any significant tolerability issues. She wants to return to usual dose of Adderall for ADD bc of mor poor cognitive function with reduction.  Also discussed that depression will impair cognitive function.  Discussed safety plan at length with patient.  Advised patient to contact office with any worsening signs and symptoms.  Instructed patient to go to the [facility] emergency room for evaluation if experiencing any acute safety concerns, to include suicidal intent.  Have encouraged her to seek a second psychiatric opinion due to treatment resistant depression.  She has done so.  We have not  received that letter yet. Have encouraged her to investigate the alternative to Spravato, [place] as another option.  She has an appointment pending to discuss this.  Rec she pursue [place] consultation.  Has Maintained sobriety  FU with Spravato twice weekly  Lynder Parents, MD, DFAPA     Please see After Visit Summary for patient specific instructions.  Future Appointments  Date Time Provider [place]  06/20/2022  9:30 AM Cottle, Billey Co., MD CP-CP None  06/20/2022  9:30 AM CP-NURSE CP-[facility] None           No orders of the defined types were placed in this encounter.      -------------------------------

## 2022-06-19 ENCOUNTER — Encounter: Payer: Self-pay | Admitting: Psychiatry

## 2022-06-19 NOTE — Progress Notes (Signed)
Laura Chang [id number] 10-30-1956 66 y.o.  Subjective:   Patient ID:  Laura Chang is a 66 y.o. (DOB [birth year]) female.  Chief Complaint:  Chief Complaint  Patient presents with   Follow-up   Depression   Anxiety   ADHD     HPI Laura Chang presents to the office today for follow-up of depression and anxiety and ADD.  seen November 12, 2019.  Melted down in 2020.  Went to [employer] in July.  No withdrawal.  1 drink since then.  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 She was on no stimulant and no SSRI but was taking Strattera and Wellbutrin.  The following changes were made. Stop Strattera. OK restart stimulant bc severe ADD Restart Adderall 1 daily for a few days and if tolerated then restart 1 twice daily. If not tolerated reduce the dosage if needed. May need to stop Wellbutrin if not tolerating the stimulant.  Yes.  DC Wellbutrin Restart Prozac 20 mg daily.  February 2021 appointment with the following noted: Completed grant proposal.  Couldn't doit without Adderall.  Sold a bunch of work.   Adderall XR lasts about 3 pm.  Strength seems about right.  BP been OK.  Not jittery.   Stopped Wellbutrin but had no SE. Mood drastically better with grant proposal and back on fluoxetine.  Less depressed and lethargic.  No anxiety.  Cut back on coffee. Started back with devotions and stronger faith. Plan: Continue Prozac 20 mg daily. May have to increase the dose at some point in the future given that she usually was taking higher dosages but she is getting good response at this time. Restart Wellbutrin off label for ADD since can't get 2 ADDERALL daily. 150 mg daily then 300 mg daily. She can adjust the dose between 150 mg and 300 mg daily to get the optimal effect.   05/11/2020 appointment with the following noted: Has been inconsistent with Prozac and Wellbutrin. Not sure of the effect of Wellbutrin. Biggest deterrent in work is anxiety.   Some of the work is conceptual and difficult at times.  Can feel she's not up to a project at times.  Overall is OK but would like a steadier benefit from stimulant.  Exhausted from managing concentration and keeping up with things from the day.  Loses things.  Not good keeping up with schedule. Overall productive and emotionally OK. Can feel Adderall wear off. Mood is better in summer and worse in the winter.   F died in September 25, 2023 and that is a loss. No SE Wellbutrin. Still attends AA meetings.  Real benefit from [place] last year. Recognizes effect of anemia on ADD and mood.  Had iron infusions last winter. Plan:  Wellbutrin off label for ADD since can't get 2 ADDERALL daily. 150 mg daily then 300 mg daily.  01/24/2021 appointment with following noted: Doing a program called [employer] mindfulness app since Xmas.  CBT app helped the depression.  App helped her focus better.  Lost sign weight. Writing a lot. Before Xmas felt depressed and started negative thinking worse, self denigrating. Not drinking. More isolated.   Recognizes mo is narcissist.    Didn't tell anyone she was born until 3 mos later.  M aloof and uninterested in pt.  Lied about her birthday.  Mo lack of affection even with pt's kids. Going to [place] for a year and it helped her to quit drinking. Also misses kids being gone  with a hole also. Plan: No med changes  05/04/2021 appointment with the following noted: Therapist [name] thinks she's manic. Lost weight to 144#.   States she is still sleeping okay.  Admits she is hyper and recognizes that she is likely manic.  She feels great, euphoric with an increased sense of spiritual connectedness to God.  She has racing thoughts and talks fast and talks a lot and this is noted by her husband.  He thinks she is a bit hyper.  She has been able to maintain sobriety although she will have 1 glass of wine on special occasions but does not drink by herself.  She is not drinking to  excess.  She denies any dangerous impulsivity.  She is clearly not depressed and not particularly anxious.  She has no concerns about her medication and she has been compliant.  06/16/21 appt noted: So much better.  Going through a lot but the manic thing happened on top of it.  So much slower.  Didn't feel like losing anything with risperidone.  Likes the Adderalll at 10 mg. Some drowsiness in the AM and very drowsy from risperidone 2 mg HS. Prayer life is better. Handling stress better. Less depressed with risperidone. Still likes trazodone. Sleeps well. Plan: Reduce Prozac to 10 mg daily.  Consider stopping it because it can feel the mania however she is reluctant to do that because she fears relapse of depression. Reduce risperidone to 1.5 mg nightly due to side effects.  Discussed risk of worsening mania.  07/25/2021 appointment with the following noted: Misses the Adderall and hard to function without it. Depressed now. Heavy chest.  Anxious and guilty.  Body feels heavy.   Hates Wellbutrin.   Plan: Increase fluoxetine to 20 mg daily Add Abilify 1/2 of 15 mg tablet daily Wean wellbutrin by 1 tablet each week  bc she feels it is not helpful and DT polypharmacy Reduce risperidone to 1 daily for 1 week and stop it. Disc risk of mania. Increase Adderall to XR 20 mg AM  08/08/21 Much less depressed and starting to feel normal I feel a lot better. No SE.  Speech normal off risperidone. Sleeping OK on trazaodone and enough.   Noticed benefit from Adderall again. Plan: continue fluoxetine to 20 mg daily Continue Abilify 1/2 of 15 mg tablet daily for depression and mania continue Adderall to XR 20 mg AM  10/10/2021 phone call: Pt stated she feels like the Abilify should be decreased to 23m.She said she is depressed but rational and not suicidal.She has an appt Monday and can wait until then if you prefer. MD response: Reduce the Abilify to 7.5 mg every other day.  We will meet on 10/16/2021  and decide what to do from there.  10/16/2021 appointment with the following noted: More depressed.  Most depressed I've ever been.  Just numb.  Sense of grief.   Thinks the manic episode was unlike anything else she ever had.  Doesn't want to medicate against it.  Don't enjoy people.  Easily overwhelmed.  Had some death thoughts but not suicidal.  Has been functional.  Feels better today after reducing Abilify to every other day but she is only been doing that for 3 days. A/P: Episode of post manic depression was explained. continue fluoxetine to 20 mg daily Hold Abilify for 1 week then resume Abilify 1/2 of 15 mg tablet every other day for depression and mania continue Adderall to XR 20 mg AM  10/27/2021  appointment with the following noted: I'm doing so much better.  Handling the depression better. Better self talk and spiritual focus has helped.   Dep 6/10 manifesting as anxiety with low confidence.   F died 2  years ago and M 66 yo and is dependent . She is working hard to feel better but still feels depressed.  She almost feels like she has a little more anxiety since restarting Abilify every other day. Plan: continue fluoxetine to 20 mg daily DC Abilify .  Vrayalar 1.5 mg QOD to try to get rid of depression ASAP. continue Adderall to XR 20 mg AM  11/10/2021 appointment with the following noted: Busy with Xmas and it was fun with family but then a big let down.  Did well with it.  Functioned well with it.  Working hard on things with depression.  Not shutting down. Not sure but feels better today but yesterday was hard.  Difficulty dealing with mother.  She won't do anything to help herself.  Yesterday with her all day.  Won't do PT and has isolated herself.    Lack of confidence.   No SE with Vraylar.  11/24/21 urgent appointment appt noted: More and more depressed.   So anxious and doesn't want to be alone but can do so. No appetite. Hurts inside. Has had some fleeting suicidal  thoughts but would not act on them.  Tolerating meds. Has been consistent with Vraylar 1.5 mg every other day, fluoxetine 20 mg daily Plan: Increase Vraylar to 1.5 mg daily Change Prozac to Trintellix 10 mg daily. Discussed side effects of each continue Adderall to XR 20 mg AM  12/27/2021 appointment with the following noted: Not OK.  I feel less depressed but feels bat shit. Not sleeping well.  Extremely anxious. Off and on sleep. 3-4 hours of sleep.   Still having daily SI.  But also become obvious has so much to do.  Overwhelmed by tasks.   Needs anxiety meds to just function. Not more motivated.  Walked yesterday.   Feels afraid like in trouble but not irritable or angry. DC DT agitation Vraylar to 1.5 mg daily Change Prozac to Trintellix 10 mg daily. Hold Adderall to XR 20 mg AM Clonidine 0.1 1/2 tablet twice daily for 2 days and if needed for anxiety and sleep increase to 1 twice daily Ok temporary Ativan 1 mg 3 times daily as needed anxiety  01/05/22 appt noted: Off fluoxetine and  Trintellix.  Only on Ativan, trazodone and Adderall XR 20 plus added clonidine 0.1 mg BID Didn't think she needed to start Trintellix. Not taking Ativan.   Didn't like herself last week. Feels some better today. Wonders if the manic sx Not agitated.  Anxiety kind of calmed down.  A lot to be anxious about situationally.  $ stress. Concerns about downers with meds. Can't access normal personality. ? Lethargy and inability to talk as sE. Plan: Latuda 20-40 mg daily with food. Adderall to XR 20 mg AM Clonidine 0.1 1/2 tablet twice daily  reduce dose to be sure no SE Ok temporary Ativan 1 mg 3 times daily as needed anxiety  01/19/22 appt noted: Taking Latuda 20 mg daily.  Took 40 mg once and felt anxious and  SI Still depressed and not very reactive Anxiety mainly about the depression and fears of the future. She wants to revisit manic sx and thinks it was maybe bc taking delta 8 bc was taking a lot  of it so still  doesn't think she's classic bipolar. She wants to only take Prozac bc thinks Latuda is perpetuating depression. Says the delta 8 was very psychaedelic.  When not taking it was not manic.  Sleeping ok again.  Plan: Per her request DC Latuda 20-40 mg daily with food. She wants to continue Prozac alone AMA  Adderall to XR 20 mg AM Clonidine 0.1 1/2 tablet twice daily  reduce dose to be sure no SE Ok temporary Ativan 1 mg 3 times daily as needed anxiety  01/23/2022 phone call complaining of increased anxiety since stopping Latuda.  She will try increasing clonidine.  01/26/2022 phone call not feeling well and wanted to restart the Vraylar.  However notes indicate that had made her agitated therefore she was encouraged to pick up samples of Rexulti 1 mg and start that instead.  02/06/2022 phone call: Stating she felt the Rexulti was helping with depression but she was not sleeping well and obsessing over things.  She was encouraged to increase Rexulti to 2 mg daily and increase trazodone for sleep.  02/09/2022 appointment with the following noted: This was an urgent work in appointment No sleep last night with trazodone 100 mg HS Nothing really better depression or anxiety. Ruminating negative anxious thoughts. Did not tolerate Rexulti because it was causing insomnia.  Does not think it helped depression.  Lacks emotion that she should have.  Lacks her usual personality.  Some hopeless thoughts.  Some death thoughts.  Some suicidal thoughts without plan or intent Plan: DC Rexulti and Prozac & DC trazodone Adderall to XR 20 mg AM Clonidine 0.1 1/tablet twice daily  reduce dose to be sure no SE Ok temporary Ativan 1 mg 3 times daily as needed anxiety Start Seroquel XR 150 mg nightly  03/02/2022 appointment: Laura Chang called back a few days after starting Seroquel stating it was making her more anxious and more depressed.  This seemed unlikely as this medicine rarely ever causes anxiety.   She stopped the medication waited 3 days and called back still had anxiety and depression but thought perhaps the anxiety was a little better.  She did not want to take the Seroquel. She knew about the option of Spravato and wanted to pursue that. Now questions whether to return to Seroquel while waiting to start Spravato bc feels just as bad without it and knows she didn't give it enough time to work.   MADRS 46  ECT-MADRS    Flowsheet Row Clinical Support from 05/21/2022 in [place] Visit from 03/02/2022 in [facility]  MADRS Total Score 27 46      03/14/22 appt noted: Pt received Spravato 56 mg first dose today with some dissociative sx which were not severe.  She was anxious prior to the administration and felt better after receiving lorazepam 1 mg.  No NV, or HA. Wants to continue Spravato. Ongoing depression and desperate to feel better.  I'm not myself DT deprsssion which is most severe in recent history.  Anhedonia.  Low motivation.  Social avoidance. Continues to think all recent med trials are making her worse.  Sleep ok with Seroquel.  03/16/22 appt noted: Received Spravato 84 mg for the first time.  some dissociative sx which were not severe.  She was anxious prior to the administration and felt better after receiving lorazepam 1 mg.  No NV, or HA. Wants to continue Spravato.   Does not feel any better or different since the last appt.  Ongoing depression.  Ongoing  depression and desperate to feel better.  I'm not myself DT deprsssion which is most severe in recent history.  Anhedonia.  Low motivation.  Social avoidance. Continues to think all recent med trials are making her worse.  Sleep ok with Seroquel.  Does not want to continue Seroquel for TRD.  03/20/2022 appointment noted: Came for Spravato administration today.  However blood pressure was significantly elevated approximately 180/115.  She was given lorazepam 1 mg and clonidine 0.2 mg  to try to get it down. She states she regretted stopping the Seroquel XR 300 mg tablets.  She now realizes it was helpful.  She did not sleep much at all last night.  She did not take the Adderall this morning. 2 to 3 hours after arrival blood pressure was still elevated at  170/110, 62 pulse.  For Spravato administration was canceled for today.  She admits to being anxious and depressed.  She is not suicidal.  She is highly motivated to receive the Spravato.  We discussed getting it tomorrow.  03/22/2022 appointment noted: Patient's blood pressure was never stable enough yesterday in order to get her in for Spravato administration.  She was encouraged to see her primary care doctor.  It is better today.  03/26/2022 appointment with the following noted: Blood pressure was better.  Saw her primary care doctor who started on oral Bystolic 2.5 mg daily. Received Spravato 84 mg today as scheduled.  Tolerated it well without nausea or vomiting headache or chest pain or palpitations.  Her blood pressure was borderline but manageable. She remains depressed and anxious.  She is ambivalent about the medicine and desperate to get to feel better.  Continues to have anhedonia and low energy and low motivation and reduced ability to do things.  Less social.  Not suicidal.  03/28/22 appt noted: Received Spravato 84 mg today as scheduled.  Tolerated it well without nausea or vomiting headache or chest pain or palpitations.  Her blood pressure was borderline but manageable. Has not seen any improvement so far.  Tolerating Seroquel.  Inconsistent with Bystolic and BP has been borderline high. Still depressed and anxious and anhedonia.  Low motivation, energy, productivity. Taking quetiapine and tolerating XR 300 mg nightly.  04/04/22 appt noted: Received Spravato 84 mg today as scheduled.  Tolerated it well without nausea or vomiting headache or chest pain or palpitations.  Her blood pressure was borderline but  manageable. Has not seen any improvement so far.  Tolerating Seroquel.   She still tends to think that the medications are making her worse.  She has said this about each of the recent psychiatric medicines including Seroquel.  However her husband thinks she is improved.  She also admits there is some improvement in productivity.  She still feels highly anxious.  She still does not enjoy things as normal.  She still feels desperate to improve as soon as possible. Has been taking Seroquel XR since 03/20/2022  04/10/22 appt noted: Received Spravato 84 mg today as scheduled.  Tolerated it well without nausea or vomiting headache or chest pain or palpitations.  Her blood pressure was borderline but manageable. Has not seen any improvement so far.  Tolerating Seroquel.  Doesn't like Seroquel bc she thinks it flattens here. 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  Tolerated it well without nausea or vomiting headache or chest pain or palpitations.  Her blood pressure was borderline but manageable. Has not seen any improvement so far.  Tolerating Seroquel.  Doesn't like Seroquel bc she thinks it flattens her. Received Spravato 84 mg today as scheduled.  Tolerated it well without nausea or vomiting headache or chest pain or palpitations.  Her blood pressure was borderline but manageable. Has not seen any improvement so far.  Tolerating Seroquel.  Doesn't like Seroquel bc she thinks it flattens here.  We discussed her ambivalence about it. She is starting Auvelity and has tolerated it the last 2 days without side effect.  She still does not feel like herself and feels flat and not enjoying things with suppressed expressed emotion  04/17/2022 appointment with the following noted: Received Spravato 84 mg today as scheduled.  Tolerated it  well without nausea or vomiting headache or chest pain or palpitations.  Her blood pressure was borderline but manageable. Has not seen any improvement so far.  Tolerating Seroquel.  Doesn't like Seroquel bc she thinks it flattens her. She has been tolerating the Auvelity 1 in the morning without side effects for about a week.  She has not noticed significant improvement so far.  She still feels depressed and flat and not herself.  Other people notice that she is flat emotionally.  She is not suicidal.  She does feel discouraged that she is not getting better yet.  04/19/2022 appointment noted: Has increased Auvelity to 1 twice daily for 2 days, continues quetiapine XR 300 mg nightly, clonidine 0.3 mg twice daily, lorazepam 1 mg twice daily for anxiety and Adderall XR 20 mg in the morning. No obious SE but she still thinks quetiapine XR is making her feel down.  But not sedated Received Spravato 84 mg today as scheduled.  Tolerated it well without nausea or vomiting headache or chest pain or palpitations.  Her blood pressure was borderline but manageable. She still feels quite anxious and feels it necessary to take both the clonidine and lorazepam twice a day to manage her anxiety.  She has been consistently down and flat and not herself until yesterday afternoon she noted an improvement in mood and feeling much more like herself with her normal personality reemerging.  She was quite depressed in the morning with very dark negative thoughts.  She did not have those dark negative thoughts this morning.  She had a lot of questions about medication and when she was expecting to be improved and why she has not shown improvement up to now.  04/23/22 appt noted: Has increased Auvelity to 1 twice daily for 1 week, continues quetiapine XR 300 mg nightly, clonidine 0.3 mg twice daily, lorazepam 1 mg twice daily for anxiety and Adderall XR 20 mg in the morning. No obious SE but she still thinks quetiapine XR is  making her feel down.  But not sedated Received Spravato 84 mg today as scheduled.  Tolerated it well without nausea or vomiting headache or chest pain or palpitations.  She is still depressed but admits better function and is able to enjoy social interactions. Tolerating meds.  Would like to feel better for sure. Not herself.  Flat. Plan increase Auvelity to 1 tab BID as planned and reduce Quetiapine to 1/2 of ER 300 mg  bc NR for depression.  04/25/2022 appointment with the following noted: clonidine 0.3 mg twice daily, lorazepam 1 mg twice daily for anxiety and Adderall XR 20 mg in the morning. Seroquel XR 300 HS No obious SE but she still thinks quetiapine  XR is making her feel down.  But not sedated Received Spravato 84 mg today as scheduled.  Tolerated it well without nausea or vomiting headache or chest pain or palpitations.  Called yesterday with more anxiety.  Had increased Auvelity for 1 day and reduced Seroquel XR for 1 day.  Felt restless and fearful  05/01/2022 appointment noted: clonidine 0.3 mg twice daily, lorazepam 1 mg twice daily for anxiety and Adderall XR 20 mg in the morning. Seroquel XR 150 HS, Auvelity 1 BID Received Spravato 84 mg today as scheduled.  Tolerated it well without nausea or vomiting headache or chest pain or palpitations.  Nurse has noted patient has called multiple times sometimes asking the same question repeatedly.  It is unclear whether she is truly forgetful or is just anxious seeking reassurance. Patient acknowledges ongoing depression as well as some anxiety but states she has felt a little better in the last couple of days.  She has reduced the Seroquel to 150 mg at night and has increased Auvelity to 1 twice daily but only for 1 day.  So far she seems to be tolerating it.  05/03/22 appt noted: clonidine 0.2 mg twice daily, lorazepam 1 mg twice daily for anxiety and Adderall XR 20 mg in the morning. Seroquel XR 150 HS, Auvelity 1 BID BP high this am  about 170/100 and received extra clonidine 0.2 mg and came to receive Spravato.  Not dizzy, no SOB, nor CP but BP is still high Could not receive Spravato today bc BP high and pulse low at 30 ppm. Still depressed and anxious. Plan: continue trial Auvelity with Spravato She needs to get BP and pulse managed  05/08/22 TC: RTC  H Michael NA and mailbox full.  Could not leave message.  Pt  -  talked to she and H on speaker. H worried over wife.  Vacant stare.  Slurs words at times.  Not smiling. Reduced enjoyment.  Depression.  Withdrawn from usual activities.  Some irritability.  Anxious. Disc her concerns meds are making her worse.  Extensive discussion about her treatment resistant status.  There is a consistent pattern of not taking the medicines long enough to get benefit because she believes the meds are making her worse.  However the symptoms she describes as side effects are exactly the same symptoms that she had prior to taking the medication RX for  the depression.  So it is not clear that these are actual side effects. This is true about the 2 most recent meds including Seroquel and Auvelity.  Recommend psychiatric consultation in hopes of improving her comfort level with taking prescribed medications for a sufficient length of time to provide benefit. Extensive discussion about ECT is the treatment of choice for treatment resistant depression.  Spravato may work if she can comply with consistency.  There are medication options but they take longer to work.   Plan:  Reduce clonidine to 0.1 mg BID DT bradycardia.  Talk with PCP about BP and low pulse problems which are interfering with her consistent compliance with Spravato.   Limit lorazepam to 3 -4  mg daily max. Excess use is the cause of slurring speech.  She must stop excess use or will have to stop the med. Stop Auvelity per her request.  But she has only been on the full dose for a little over a week and clearly has not had time to get  benefit from it.  She thinks maybe it is making her more  anxious. Reduce Seroquel from 150XR to 50 -100 mg at night IR.  She couldn't sleep when stopped it completely. Will not start new antidepressant until her SE issues are resolved or not. Get second psych opinion from Yehuda Budd MD or another psychiatrist.  H's sister is therapist in [facility] Hoyer, MD, [facility]  05/16/2022 appointment with the following noted: Received Spravato 84 mg today as scheduled.  Tolerated it well without nausea or vomiting headache or chest pain or palpitations.  She stopped Auvelity as discussed last week. On her own, without physician input, she restarted Wellbutrin XL 450 mg every morning today.  She had taken it in the past.  She feels jittery and anxious. She feels less depressed than she did last week.  But she is still depressed without her usual range of affect.  She still is less social and less motivated than normal. Her primary care doctor increased the dose of losartan Plan: Stop Seroquel Reduce Wellbutrin XL to 300 mg every morning.  Starting the dose at 450 every morning is likely causing side effects of jitteriness and it should not be started at that have a dosage. Recommend she not change meds on her own without MDM put  05/23/2022 appointment with the following noted: Received Spravato 84 mg today as scheduled.  Tolerated it well without nausea or vomiting headache or chest pain or palpitations.  Has not dropped seroquel XR 300 mg 1/2 tablet nightly bc couldn't sleep without it. Has not tried lower dose quetiapine 50 mg HS Still feels depressed.   BP is better managed so far, just saw PCP.  BP is better today and infact is low today. Dropped clonidine as directed from 0.3 mg BID bc inadequate control of BP to 0.2 mg BID.  However she wants to increase it back to 0.3 mg twice daily because she feels it helped her anxiety better.  Wonders about increasing Wellbutrin for depression.  However she  has only been on 300 mg a day for a week.  She was on 450 mg daily in the past.  06/06/22 appt noted: Received Spravato 84 mg today as scheduled.  Tolerated it well without nausea or vomiting headache or chest pain or palpitations.  She is still depressed and anxious.  She wants to try to stop the Seroquel but cannot sleep without some of it.  She is taking lorazepam 1 mg 4 times daily and still having a lot of anxiety.  She wants to increase clonidine back to 0.3 mg twice daily.  She hopes for more improvement She recently went for a second psychiatric opinion as suggested the results of that are pending.  06/11/22 appt noted: Received Spravato 84 mg today as scheduled.  Tolerated it well without nausea or vomiting headache or chest pain or palpitations.  She is still depressed and anxious. Without much change.  Still hopeless, anhedonia, reduced inteterest and motivation.  Tolerating meds. Disc concerns Spravato is not hleping much. Plan: stop Seroquel and start olanzapine 10 mg HS for TRD and anxiety.  06/13/2022 appointment noted: Received Spravato 84 mg today as scheduled.  Tolerated it well without nausea or vomiting headache or chest pain or palpitations.  She is still depressed and anxious. Without much change.  Still hopeless, anhedonia, reduced inteterest and motivation.  Tolerating meds. Disc concerns Spravato is not helping much as hoped but is improving a bit in the last week. Tolerating meds. Continues Wellbutrin XL 450 AM, tolerating recently started olanzapine  10 mg HS.  Sleep is good.   Pending appt with [place] consult.  06/18/22 appt noted: Received Spravato 84 mg today as scheduled.  Tolerated it well without nausea or vomiting headache or chest pain or palpitations.  Tolerating meds. Continues Wellbutrin XL 450 AM, tolerating recently started olanzapine  10 mg HS. Continues Adderall XR 20 amd and has tried to reduce lorazepam to $RemoveBefo'1mg'MioouVMRqqI$  TID Sleep is good.   Pending appt with [place]  consult. Depression is a little bit better in the last week with a little improvement in emotional expression and interest.  She is pushing herself to be more active.  Her daughter thought she was a little better than she has been.  However she is still depressed and still not her normal self with anhedonia and reduced emotional expressiveness.  Past Psychiatric Medication Trials: fluoxetine, duloxetine, Viibryd, lamotrigine, Pristiq, sertraline, citalopram,  Trintellix anxious and SI Wellbutrin XL 450  Adderall, Adderall XR, Vyvanse, Ritalin, Strattera low dose NR Lorazepam Trazodone  Depakote,  lamotrigine cog complaints Lithium remotely Abilify 7.5  Vraylar 1.5 mg daily agitation and insomnia Rexulti insomnia Latuda 40 one dose, CO anxious and SI Seroquel XR 300  At visit November 12, 2019. We discussed Patient developed an increasingly severe alcohol dependence problem since her last visit in January.  She went to [facility] and has had no alcohol since then except 1 day.  She never abused stimulants but they took her off the stimulants at [facility].  Her ADD was markedly worse.  The Wellbutrin did not help the ADD.   D history lamotrigine rash at 66 yo  Review of Systems:  Review of Systems  Constitutional:  Positive for fatigue.  Cardiovascular:  Negative for chest pain and palpitations.  Musculoskeletal:  Positive for arthralgias, back pain and joint swelling.       SP hip surgery October 2020  Neurological:  Negative for dizziness and light-headedness.  Psychiatric/Behavioral:  Positive for decreased concentration and dysphoric mood. Negative for agitation, behavioral problems, confusion, hallucinations, self-injury, sleep disturbance and suicidal ideas. The patient is nervous/anxious. The patient is not hyperactive.     Medications: I have reviewed the patient's current medications.  Current Outpatient Medications  Medication Sig Dispense Refill   amLODipine  (NORVASC) 2.5 MG tablet Take 2.5 mg by mouth daily.     amphetamine-dextroamphetamine (ADDERALL XR) 20 MG 24 hr capsule Take 1 capsule (20 mg total) by mouth every morning. 30 capsule 0   amphetamine-dextroamphetamine (ADDERALL XR) 20 MG 24 hr capsule Take 1 capsule (20 mg total) by mouth every morning. 30 capsule 0   buPROPion (WELLBUTRIN XL) 150 MG 24 hr tablet Take 3 tablets (450 mg total) by mouth daily. 90 tablet 0   cloNIDine (CATAPRES) 0.2 MG tablet Take 1 tablet (0.2 mg total) by mouth 2 (two) times daily. 60 tablet 1   Esketamine HCl, 84 MG Dose, (SPRAVATO, 84 MG DOSE,) 28 MG/DEVICE SOPK USE 3 SPRAYS IN EACH NOSTRIL TWICE WEEKLY 3 each 5   iron polysaccharides (NIFEREX) 150 MG capsule TAKE 1 CAPSULE BY MOUTH EVERY DAY 90 capsule 1   LORazepam (ATIVAN) 1 MG tablet Take 1 tablet (1 mg total) by mouth every 6 (six) hours as needed for anxiety. 100 tablet 0   losartan (COZAAR) 50 MG tablet Take 50 mg by mouth daily.     nebivolol (BYSTOLIC) 2.5 MG tablet Take 2.5 mg by mouth daily.     OLANZapine (ZYPREXA) 10 MG tablet Take 1 tablet (10 mg total) by mouth  at bedtime. 30 tablet 0   No current facility-administered medications for this visit.    Medication Side Effects: None  Allergies:  Allergies  Allergen Reactions   Metronidazole Shortness Of Breath and Other (See Comments)    Heart pounding   Ferrlecit [Na Ferric Gluc Cplx In Sucrose] Other (See Comments)    Infusion reaction 05/12/2019    Past Medical History:  Diagnosis Date   ADHD    Anemia    Anxiety    Arthritis    Depression    Heart murmur    i went to see a cardiologit slast eyar  and i had zero plaque,    PONV (postoperative nausea and vomiting)    Recovering alcoholic in remission (HCC)     Family History  Problem Relation Age of Onset   Atrial fibrillation Mother    CAD Father     Social History   Socioeconomic History   Marital status: Married    Spouse name: Not on file   Number of children: Not on  file   Years of education: Not on file   Highest education level: Not on file  Occupational History   Not on file  Tobacco Use   Smoking status: Former    Types: Cigarettes    Quit date: 08/16/2003    Years since quitting: 18.8   Smokeless tobacco: Never   Tobacco comments:     08-28-2019 "i smoked 2 cigarettes in the last month since my father  passed"  Vaping Use   Vaping Use: Never used  Substance and Sexual Activity   Alcohol use: Yes    Alcohol/week: 10.0 standard drinks of alcohol    Types: 10 Glasses of wine per week   Drug use: No   Sexual activity: Not on file  Other Topics Concern   Not on file  Social History Narrative   Not on file   Social Determinants of Health   Financial Resource Strain: Not on file  Food Insecurity: Not on file  Transportation Needs: Not on file  Physical Activity: Not on file  Stress: Not on file  Social Connections: Not on file  Intimate Partner Violence: Not on file    Past Medical History, Surgical history, Social history, and Family history were reviewed and updated as appropriate.   Please see review of systems for further details on the patient's review from today.   Objective:   Physical Exam:  There were no vitals taken for this visit.  Physical Exam Constitutional:      General: She is not in acute distress. Neurological:     Mental Status: She is alert and oriented to person, place, and time.     Coordination: Coordination normal.     Gait: Gait normal.  Psychiatric:        Attention and Perception: Attention and perception normal.        Mood and Affect: Mood is anxious and depressed. Affect is blunt. Affect is not labile, angry, tearful or inappropriate.        Speech: Speech is not rapid and pressured or slurred.        Behavior: Behavior is not slowed.        Thought Content: Thought content is not paranoid or delusional. Thought content does not include homicidal or suicidal ideation. Thought content does not  include suicidal plan.        Cognition and Memory: Cognition normal. Memory is impaired. She does not exhibit impaired recent memory.  Judgment: Judgment normal.     Comments: Insight intact. No auditory or visual hallucinations. No delusions.  Depression  ongoing. Affect still depressed but less blunted than it was. No Sui intent plan      Lab Review:     Component Value Date/Time   NA 137 01/12/2021 1430   NA 140 11/18/2018 1544   K 3.8 01/12/2021 1430   CL 108 01/12/2021 1430   CO2 22 01/12/2021 1430   GLUCOSE 94 01/12/2021 1430   BUN 14 01/12/2021 1430   BUN 20 11/18/2018 1544   CREATININE 0.82 01/12/2021 1430   CALCIUM 8.9 01/12/2021 1430   PROT 6.6 01/12/2021 1430   ALBUMIN 3.9 01/12/2021 1430   AST 12 (L) 01/12/2021 1430   ALT 11 01/12/2021 1430   ALKPHOS 46 01/12/2021 1430   BILITOT 0.5 01/12/2021 1430   GFRNONAA >60 01/12/2021 1430   GFRAA >60 09/02/2019 0249   GFRAA >60 01/27/2019 0811       Component Value Date/Time   WBC 4.5 01/12/2021 1430   RBC 4.32 01/12/2021 1430   HGB 12.8 01/12/2021 1430   HGB 12.9 07/17/2019 0953   HCT 38.5 01/12/2021 1430   HCT 21.9 (L) 12/25/2018 1221   PLT 272 01/12/2021 1430   PLT 286 07/17/2019 0953   MCV 89.1 01/12/2021 1430   MCH 29.6 01/12/2021 1430   MCHC 33.2 01/12/2021 1430   RDW 12.4 01/12/2021 1430   LYMPHSABS 1.4 01/12/2021 1430   MONOABS 0.4 01/12/2021 1430   EOSABS 0.0 01/12/2021 1430   BASOSABS 0.0 01/12/2021 1430    No results found for: "POCLITH", "LITHIUM"   No results found for: "PHENYTOIN", "PHENOBARB", "VALPROATE", "CBMZ"   .res Assessment: Plan:    Recurrent major depression resistant to treatment ([place])  Generalized anxiety disorder  Attention deficit hyperactivity disorder (ADHD), predominantly inattentive type  Insomnia due to mental condition  Accelerated hypertension   She has treatment resistant major depression at this time.  Have  discussed some of her recent abnormal  behaviors leading to this depressive episode getting worse which she says were associated with heavy use of delta 8 and not a manic episode.  She realizes now that that was not good for her.  She stopped all use of other drugs including those available over-the-counter such as delta 8 or any other THC related products.  She is no longer having any of those types of behaviors and instead is depressed. She remains persistently depressed with lack of interest and lack of feeling for things that normally she would have feelings about.  She has low motivation and energy.  She is sad and down.  She is less productive than usual.  Her concentration is poor.  She has high degree of anxiety as well. She has a very negative self-image and low self-esteem.  These are all uncharacteristic for her  Because of the question of cognitive issues we will check TSH, B12 and folate.  Pending She has still not done these blood tests.  Blood pressure has become more normal, so she was able to receive Spravato.   Patient was administered Spravato 84 mg intranasally today.  The patient experienced the typical dissociation which gradually resolved over the 2-hour period of observation.  There were no complications.  Specifically the patient did not have nausea or vomiting or headache.  Blood pressures remained within normal ranges at the 40-minute and 2-hour follow-up intervals.  By the time the 2-hour observation period was met the patient was alert  and oriented and able to exit without assistance.  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  See nursing note for further details.  Failed multiple antidepressants.  Many of them were not actual failures but intolerances and it is unclear whether some of that was more connected with anxiety than true side effects.  1 example is the [place].  In general she does not want to try anything but an  antidepressant but has failed all major categories of antidepressants except TCAs and MAO inhibitors which have not been tried.  Could consider a TCA but given her manic response to delta 8 there is some concern about the risk of a TCA triggering mania.  It could be combined with lithium but she generally is unfavorable about taking lithium.  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  She wants to continue Spravato though limited improvement so far.  We discussed discussed the side effects in detail as well as the protocol required to receive Spravato.    Started olanzapine 10 mg HS on 7.31. 23 and stopped Seroquel without px so far. This change needs more time to evaluate. Discussed potential metabolic side effects associated with atypical antipsychotics, as well as potential risk for movement side effects. Advised pt to contact office if movement side effects occur.   continue Wellbutrin XL to prior dose 450 mg AM  Started Spravato 84 mg twice weekly on 03/16/2022.  But has missed several doses due to elevated blood pressure prior to administration  Adderall  XR 20 mg AM  Ok temporary Ativan 1 mg 3-4 times daily as needed anxiety but try to cut it back. Is not ideal to use benzodiazepine with stimulant but because of the severity of her symptoms it has been necessary.  Hope to eventually eliminate the benzodiazepine.  Expected as her depression improves her anxiety will improve as well.  However lately her anxiety has been unmanageable.  We will expect that to improve as the depression improves.  She has headed insturctions to reduce this.  Continue clonidine 0.2 mg BID off label for anxiety and helps BP partially. BP is better controlled.  Discussed potential benefits, risks, and side effects of stimulants with patient to include increased heart rate,  palpitations, insomnia, increased anxiety, increased irritability, or decreased appetite.  Instructed patient to contact office if experiencing any significant tolerability issues. She wants to return to usual dose of Adderall for ADD bc of mor poor cognitive function with reduction.  Also discussed that depression will impair cognitive function.  Discussed safety plan at length with patient.  Advised patient to contact office with any worsening signs and symptoms.  Instructed patient to go to the [facility] emergency room for evaluation if experiencing any acute safety concerns, to include suicidal intent.  Have encouraged her to seek a second psychiatric opinion due to treatment resistant depression.  She has done so.  We have not received that letter yet. Have encouraged her to investigate the alternative to Spravato, [place] as another option.  She has an appointment pending to discuss this.  Rec she pursue [place] consultation.  Has Maintained sobriety  No med change today  FU with Spravato twice weekly unless starts TMS then will stop it.  Lynder Parents, MD, DFAPA     Please see After Visit  Summary for patient specific instructions.  Future Appointments  Date Time Provider [place]  06/20/2022  9:30 AM Cottle, Billey Co., MD CP-CP None  06/20/2022  9:30 AM CP-NURSE [facility] None           No orders of the defined types were placed in this encounter.      -------------------------------

## 2022-06-20 ENCOUNTER — Encounter: Payer: Self-pay | Admitting: Psychiatry

## 2022-06-20 ENCOUNTER — Ambulatory Visit (INDEPENDENT_AMBULATORY_CARE_PROVIDER_SITE_OTHER): Payer: 59 | Admitting: Psychiatry

## 2022-06-20 ENCOUNTER — Ambulatory Visit: Payer: 59

## 2022-06-20 VITALS — BP 87/51 | HR 68

## 2022-06-20 DIAGNOSIS — F5105 Insomnia due to other mental disorder: Secondary | ICD-10-CM | POA: Diagnosis not present

## 2022-06-20 DIAGNOSIS — F900 Attention-deficit hyperactivity disorder, predominantly inattentive type: Secondary | ICD-10-CM | POA: Diagnosis not present

## 2022-06-20 DIAGNOSIS — F411 Generalized anxiety disorder: Secondary | ICD-10-CM | POA: Diagnosis not present

## 2022-06-20 DIAGNOSIS — F339 Major depressive disorder, recurrent, unspecified: Secondary | ICD-10-CM

## 2022-06-20 DIAGNOSIS — I10 Essential (primary) hypertension: Secondary | ICD-10-CM

## 2022-06-20 NOTE — Progress Notes (Signed)
Laura Chang [id number] [birth year] 66 y.o.  Subjective:   Patient ID:  Laura Chang is a 66 y.o. (DOB [birth year]) female.  Chief Complaint:  Chief Complaint  Patient presents with   Follow-up   Depression   ADD   Anxiety     HPI Laura Chang presents to the office today for follow-up of depression and anxiety and ADD.  seen November 12, 2019.  Melted down in 2020.  Went to [employer] in July.  No withdrawal.  1 drink since then.  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 She was on no stimulant and no SSRI but was taking Strattera and Wellbutrin.  The following changes were made. Stop Strattera. OK restart stimulant bc severe ADD Restart Adderall 1 daily for a few days and if tolerated then restart 1 twice daily. If not tolerated reduce the dosage if needed. May need to stop Wellbutrin if not tolerating the stimulant.  Yes.  DC Wellbutrin Restart Prozac 20 mg daily.  February 2021 appointment with the following noted: Completed grant proposal.  Couldn't doit without Adderall.  Sold a bunch of work.   Adderall XR lasts about 3 pm.  Strength seems about right.  BP been OK.  Not jittery.   Stopped Wellbutrin but had no SE. Mood drastically better with grant proposal and back on fluoxetine.  Less depressed and lethargic.  No anxiety.  Cut back on coffee. Started back with devotions and stronger faith. Plan: Continue Prozac 20 mg daily. May have to increase the dose at some point in the future given that she usually was taking higher dosages but she is getting good response at this time. Restart Wellbutrin off label for ADD since can't get 2 ADDERALL daily. 150 mg daily then 300 mg daily. She can adjust the dose between 150 mg and 300 mg daily to get the optimal effect.   05/11/2020 appointment with the following noted: Has been inconsistent with Prozac and Wellbutrin. Not sure of the effect of Wellbutrin. Biggest deterrent in work is anxiety.   Some of the work is conceptual and difficult at times.  Can feel she's not up to a project at times.  Overall is OK but would like a steadier benefit from stimulant.  Exhausted from managing concentration and keeping up with things from the day.  Loses things.  Not good keeping up with schedule. Overall productive and emotionally OK. Can feel Adderall wear off. Mood is better in summer and worse in the winter.   F died in 2023/10/17 and that is a loss. No SE Wellbutrin. Still attends AA meetings.  Real benefit from [place] last year. Recognizes effect of anemia on ADD and mood.  Had iron infusions last winter. Plan:  Wellbutrin off label for ADD since can't get 2 ADDERALL daily. 150 mg daily then 300 mg daily.  01/24/2021 appointment with following noted: Doing a program called [employer] mindfulness app since Xmas.  CBT app helped the depression.  App helped her focus better.  Lost sign weight. Writing a lot. Before Xmas felt depressed and started negative thinking worse, self denigrating. Not drinking. More isolated.   Recognizes mo is narcissist.    Didn't tell anyone she was born until 3 mos later.  M aloof and uninterested in pt.  Lied about her birthday.  Mo lack of affection even with pt's kids. Going to [place] for a year and it helped her to quit drinking. Also misses kids being gone  with a hole also. Plan: No med changes  05/04/2021 appointment with the following noted: Therapist [name] thinks she's manic. Lost weight to 144#.   States she is still sleeping okay.  Admits she is hyper and recognizes that she is likely manic.  She feels great, euphoric with an increased sense of spiritual connectedness to God.  She has racing thoughts and talks fast and talks a lot and this is noted by her husband.  He thinks she is a bit hyper.  She has been able to maintain sobriety although she will have 1 glass of wine on special occasions but does not drink by herself.  She is not drinking to  excess.  She denies any dangerous impulsivity.  She is clearly not depressed and not particularly anxious.  She has no concerns about her medication and she has been compliant.  06/16/21 appt noted: So much better.  Going through a lot but the manic thing happened on top of it.  So much slower.  Didn't feel like losing anything with risperidone.  Likes the Adderalll at 10 mg. Some drowsiness in the AM and very drowsy from risperidone 2 mg HS. Prayer life is better. Handling stress better. Less depressed with risperidone. Still likes trazodone. Sleeps well. Plan: Reduce Prozac to 10 mg daily.  Consider stopping it because it can feel the mania however she is reluctant to do that because she fears relapse of depression. Reduce risperidone to 1.5 mg nightly due to side effects.  Discussed risk of worsening mania.  07/25/2021 appointment with the following noted: Misses the Adderall and hard to function without it. Depressed now. Heavy chest.  Anxious and guilty.  Body feels heavy.   Hates Wellbutrin.   Plan: Increase fluoxetine to 20 mg daily Add Abilify 1/2 of 15 mg tablet daily Wean wellbutrin by 1 tablet each week  bc she feels it is not helpful and DT polypharmacy Reduce risperidone to 1 daily for 1 week and stop it. Disc risk of mania. Increase Adderall to XR 20 mg AM  08/08/21 Much less depressed and starting to feel normal I feel a lot better. No SE.  Speech normal off risperidone. Sleeping OK on trazaodone and enough.   Noticed benefit from Adderall again. Plan: continue fluoxetine to 20 mg daily Continue Abilify 1/2 of 15 mg tablet daily for depression and mania continue Adderall to XR 20 mg AM  10/10/2021 phone call: Pt stated she feels like the Abilify should be decreased to 23m.She said she is depressed but rational and not suicidal.She has an appt Monday and can wait until then if you prefer. MD response: Reduce the Abilify to 7.5 mg every other day.  We will meet on 10/16/2021  and decide what to do from there.  10/16/2021 appointment with the following noted: More depressed.  Most depressed I've ever been.  Just numb.  Sense of grief.   Thinks the manic episode was unlike anything else she ever had.  Doesn't want to medicate against it.  Don't enjoy people.  Easily overwhelmed.  Had some death thoughts but not suicidal.  Has been functional.  Feels better today after reducing Abilify to every other day but she is only been doing that for 3 days. A/P: Episode of post manic depression was explained. continue fluoxetine to 20 mg daily Hold Abilify for 1 week then resume Abilify 1/2 of 15 mg tablet every other day for depression and mania continue Adderall to XR 20 mg AM  10/27/2021  appointment with the following noted: I'm doing so much better.  Handling the depression better. Better self talk and spiritual focus has helped.   Dep 6/10 manifesting as anxiety with low confidence.   F died 2  years ago and M 65 yo and is dependent . She is working hard to feel better but still feels depressed.  She almost feels like she has a little more anxiety since restarting Abilify every other day. Plan: continue fluoxetine to 20 mg daily DC Abilify .  Vrayalar 1.5 mg QOD to try to get rid of depression ASAP. continue Adderall to XR 20 mg AM  11/10/2021 appointment with the following noted: Busy with Xmas and it was fun with family but then a big let down.  Did well with it.  Functioned well with it.  Working hard on things with depression.  Not shutting down. Not sure but feels better today but yesterday was hard.  Difficulty dealing with mother.  She won't do anything to help herself.  Yesterday with her all day.  Won't do PT and has isolated herself.    Lack of confidence.   No SE with Vraylar.  11/24/21 urgent appointment appt noted: More and more depressed.   So anxious and doesn't want to be alone but can do so. No appetite. Hurts inside. Has had some fleeting suicidal  thoughts but would not act on them.  Tolerating meds. Has been consistent with Vraylar 1.5 mg every other day, fluoxetine 20 mg daily Plan: Increase Vraylar to 1.5 mg daily Change Prozac to Trintellix 10 mg daily. Discussed side effects of each continue Adderall to XR 20 mg AM  12/27/2021 appointment with the following noted: Not OK.  I feel less depressed but feels bat shit. Not sleeping well.  Extremely anxious. Off and on sleep. 3-4 hours of sleep.   Still having daily SI.  But also become obvious has so much to do.  Overwhelmed by tasks.   Needs anxiety meds to just function. Not more motivated.  Walked yesterday.   Feels afraid like in trouble but not irritable or angry. DC DT agitation Vraylar to 1.5 mg daily Change Prozac to Trintellix 10 mg daily. Hold Adderall to XR 20 mg AM Clonidine 0.1 1/2 tablet twice daily for 2 days and if needed for anxiety and sleep increase to 1 twice daily Ok temporary Ativan 1 mg 3 times daily as needed anxiety  01/05/22 appt noted: Off fluoxetine and  Trintellix.  Only on Ativan, trazodone and Adderall XR 20 plus added clonidine 0.1 mg BID Didn't think she needed to start Trintellix. Not taking Ativan.   Didn't like herself last week. Feels some better today. Wonders if the manic sx Not agitated.  Anxiety kind of calmed down.  A lot to be anxious about situationally.  $ stress. Concerns about downers with meds. Can't access normal personality. ? Lethargy and inability to talk as sE. Plan: Latuda 20-40 mg daily with food. Adderall to XR 20 mg AM Clonidine 0.1 1/2 tablet twice daily  reduce dose to be sure no SE Ok temporary Ativan 1 mg 3 times daily as needed anxiety  01/19/22 appt noted: Taking Latuda 20 mg daily.  Took 40 mg once and felt anxious and  SI Still depressed and not very reactive Anxiety mainly about the depression and fears of the future. She wants to revisit manic sx and thinks it was maybe bc taking delta 8 bc was taking a lot  of it so still  doesn't think she's classic bipolar. She wants to only take Prozac bc thinks Latuda is perpetuating depression. Says the delta 8 was very psychaedelic.  When not taking it was not manic.  Sleeping ok again.  Plan: Per her request DC Latuda 20-40 mg daily with food. She wants to continue Prozac alone AMA  Adderall to XR 20 mg AM Clonidine 0.1 1/2 tablet twice daily  reduce dose to be sure no SE Ok temporary Ativan 1 mg 3 times daily as needed anxiety  01/23/2022 phone call complaining of increased anxiety since stopping Latuda.  She will try increasing clonidine.  01/26/2022 phone call not feeling well and wanted to restart the Vraylar.  However notes indicate that had made her agitated therefore she was encouraged to pick up samples of Rexulti 1 mg and start that instead.  02/06/2022 phone call: Stating she felt the Rexulti was helping with depression but she was not sleeping well and obsessing over things.  She was encouraged to increase Rexulti to 2 mg daily and increase trazodone for sleep.  02/09/2022 appointment with the following noted: This was an urgent work in appointment No sleep last night with trazodone 100 mg HS Nothing really better depression or anxiety. Ruminating negative anxious thoughts. Did not tolerate Rexulti because it was causing insomnia.  Does not think it helped depression.  Lacks emotion that she should have.  Lacks her usual personality.  Some hopeless thoughts.  Some death thoughts.  Some suicidal thoughts without plan or intent Plan: DC Rexulti and Prozac & DC trazodone Adderall to XR 20 mg AM Clonidine 0.1 1/tablet twice daily  reduce dose to be sure no SE Ok temporary Ativan 1 mg 3 times daily as needed anxiety Start Seroquel XR 150 mg nightly  03/02/2022 appointment: Langley Gauss called back a few days after starting Seroquel stating it was making her more anxious and more depressed.  This seemed unlikely as this medicine rarely ever causes anxiety.   She stopped the medication waited 3 days and called back still had anxiety and depression but thought perhaps the anxiety was a little better.  She did not want to take the Seroquel. She knew about the option of Spravato and wanted to pursue that. Now questions whether to return to Seroquel while waiting to start Spravato bc feels just as bad without it and knows she didn't give it enough time to work.   MADRS 46  ECT-MADRS    Flowsheet Row Clinical Support from 05/21/2022 in [place] Visit from 03/02/2022 in [facility]  MADRS Total Score 27 46      03/14/22 appt noted: Pt received Spravato 56 mg first dose today with some dissociative sx which were not severe.  She was anxious prior to the administration and felt better after receiving lorazepam 1 mg.  No NV, or HA. Wants to continue Spravato. Ongoing depression and desperate to feel better.  I'm not myself DT deprsssion which is most severe in recent history.  Anhedonia.  Low motivation.  Social avoidance. Continues to think all recent med trials are making her worse.  Sleep ok with Seroquel.  03/16/22 appt noted: Received Spravato 84 mg for the first time.  some dissociative sx which were not severe.  She was anxious prior to the administration and felt better after receiving lorazepam 1 mg.  No NV, or HA. Wants to continue Spravato.   Does not feel any better or different since the last appt.  Ongoing depression.  Ongoing  depression and desperate to feel better.  I'm not myself DT deprsssion which is most severe in recent history.  Anhedonia.  Low motivation.  Social avoidance. Continues to think all recent med trials are making her worse.  Sleep ok with Seroquel.  Does not want to continue Seroquel for TRD.  03/20/2022 appointment noted: Came for Spravato administration today.  However blood pressure was significantly elevated approximately 180/115.  She was given lorazepam 1 mg and clonidine 0.2 mg  to try to get it down. She states she regretted stopping the Seroquel XR 300 mg tablets.  She now realizes it was helpful.  She did not sleep much at all last night.  She did not take the Adderall this morning. 2 to 3 hours after arrival blood pressure was still elevated at  170/110, 62 pulse.  For Spravato administration was canceled for today.  She admits to being anxious and depressed.  She is not suicidal.  She is highly motivated to receive the Spravato.  We discussed getting it tomorrow.  03/22/2022 appointment noted: Patient's blood pressure was never stable enough yesterday in order to get her in for Spravato administration.  She was encouraged to see her primary care doctor.  It is better today.  03/26/2022 appointment with the following noted: Blood pressure was better.  Saw her primary care doctor who started on oral Bystolic 2.5 mg daily. Received Spravato 84 mg today as scheduled.  Tolerated it well without nausea or vomiting headache or chest pain or palpitations.  Her blood pressure was borderline but manageable. She remains depressed and anxious.  She is ambivalent about the medicine and desperate to get to feel better.  Continues to have anhedonia and low energy and low motivation and reduced ability to do things.  Less social.  Not suicidal.  03/28/22 appt noted: Received Spravato 84 mg today as scheduled.  Tolerated it well without nausea or vomiting headache or chest pain or palpitations.  Her blood pressure was borderline but manageable. Has not seen any improvement so far.  Tolerating Seroquel.  Inconsistent with Bystolic and BP has been borderline high. Still depressed and anxious and anhedonia.  Low motivation, energy, productivity. Taking quetiapine and tolerating XR 300 mg nightly.  04/04/22 appt noted: Received Spravato 84 mg today as scheduled.  Tolerated it well without nausea or vomiting headache or chest pain or palpitations.  Her blood pressure was borderline but  manageable. Has not seen any improvement so far.  Tolerating Seroquel.   She still tends to think that the medications are making her worse.  She has said this about each of the recent psychiatric medicines including Seroquel.  However her husband thinks she is improved.  She also admits there is some improvement in productivity.  She still feels highly anxious.  She still does not enjoy things as normal.  She still feels desperate to improve as soon as possible. Has been taking Seroquel XR since 03/20/2022  04/10/22 appt noted: Received Spravato 84 mg today as scheduled.  Tolerated it well without nausea or vomiting headache or chest pain or palpitations.  Her blood pressure was borderline but manageable. Has not seen any improvement so far.  Tolerating Seroquel.  Doesn't like Seroquel bc she thinks it flattens here. 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  Tolerated it well without nausea or vomiting headache or chest pain or palpitations.  Her blood pressure was borderline but manageable. Has not seen any improvement so far.  Tolerating Seroquel.  Doesn't like Seroquel bc she thinks it flattens her. Received Spravato 84 mg today as scheduled.  Tolerated it well without nausea or vomiting headache or chest pain or palpitations.  Her blood pressure was borderline but manageable. Has not seen any improvement so far.  Tolerating Seroquel.  Doesn't like Seroquel bc she thinks it flattens here.  We discussed her ambivalence about it. She is starting Auvelity and has tolerated it the last 2 days without side effect.  She still does not feel like herself and feels flat and not enjoying things with suppressed expressed emotion  04/17/2022 appointment with the following noted: Received Spravato 84 mg today as scheduled.  Tolerated it  well without nausea or vomiting headache or chest pain or palpitations.  Her blood pressure was borderline but manageable. Has not seen any improvement so far.  Tolerating Seroquel.  Doesn't like Seroquel bc she thinks it flattens her. She has been tolerating the Auvelity 1 in the morning without side effects for about a week.  She has not noticed significant improvement so far.  She still feels depressed and flat and not herself.  Other people notice that she is flat emotionally.  She is not suicidal.  She does feel discouraged that she is not getting better yet.  04/19/2022 appointment noted: Has increased Auvelity to 1 twice daily for 2 days, continues quetiapine XR 300 mg nightly, clonidine 0.3 mg twice daily, lorazepam 1 mg twice daily for anxiety and Adderall XR 20 mg in the morning. No obious SE but she still thinks quetiapine XR is making her feel down.  But not sedated Received Spravato 84 mg today as scheduled.  Tolerated it well without nausea or vomiting headache or chest pain or palpitations.  Her blood pressure was borderline but manageable. She still feels quite anxious and feels it necessary to take both the clonidine and lorazepam twice a day to manage her anxiety.  She has been consistently down and flat and not herself until yesterday afternoon she noted an improvement in mood and feeling much more like herself with her normal personality reemerging.  She was quite depressed in the morning with very dark negative thoughts.  She did not have those dark negative thoughts this morning.  She had a lot of questions about medication and when she was expecting to be improved and why she has not shown improvement up to now.  04/23/22 appt noted: Has increased Auvelity to 1 twice daily for 1 week, continues quetiapine XR 300 mg nightly, clonidine 0.3 mg twice daily, lorazepam 1 mg twice daily for anxiety and Adderall XR 20 mg in the morning. No obious SE but she still thinks quetiapine XR is  making her feel down.  But not sedated Received Spravato 84 mg today as scheduled.  Tolerated it well without nausea or vomiting headache or chest pain or palpitations.  She is still depressed but admits better function and is able to enjoy social interactions. Tolerating meds.  Would like to feel better for sure. Not herself.  Flat. Plan increase Auvelity to 1 tab BID as planned and reduce Quetiapine to 1/2 of ER 300 mg  bc NR for depression.  04/25/2022 appointment with the following noted: clonidine 0.3 mg twice daily, lorazepam 1 mg twice daily for anxiety and Adderall XR 20 mg in the morning. Seroquel XR 300 HS No obious SE but she still thinks quetiapine  XR is making her feel down.  But not sedated Received Spravato 84 mg today as scheduled.  Tolerated it well without nausea or vomiting headache or chest pain or palpitations.  Called yesterday with more anxiety.  Had increased Auvelity for 1 day and reduced Seroquel XR for 1 day.  Felt restless and fearful  05/01/2022 appointment noted: clonidine 0.3 mg twice daily, lorazepam 1 mg twice daily for anxiety and Adderall XR 20 mg in the morning. Seroquel XR 150 HS, Auvelity 1 BID Received Spravato 84 mg today as scheduled.  Tolerated it well without nausea or vomiting headache or chest pain or palpitations.  Nurse has noted patient has called multiple times sometimes asking the same question repeatedly.  It is unclear whether she is truly forgetful or is just anxious seeking reassurance. Patient acknowledges ongoing depression as well as some anxiety but states she has felt a little better in the last couple of days.  She has reduced the Seroquel to 150 mg at night and has increased Auvelity to 1 twice daily but only for 1 day.  So far she seems to be tolerating it.  05/03/22 appt noted: clonidine 0.2 mg twice daily, lorazepam 1 mg twice daily for anxiety and Adderall XR 20 mg in the morning. Seroquel XR 150 HS, Auvelity 1 BID BP high this am  about 170/100 and received extra clonidine 0.2 mg and came to receive Spravato.  Not dizzy, no SOB, nor CP but BP is still high Could not receive Spravato today bc BP high and pulse low at 30 ppm. Still depressed and anxious. Plan: continue trial Auvelity with Spravato She needs to get BP and pulse managed  05/08/22 TC: RTC  H Michael NA and mailbox full.  Could not leave message.  Pt  -  talked to she and H on speaker. H worried over wife.  Vacant stare.  Slurs words at times.  Not smiling. Reduced enjoyment.  Depression.  Withdrawn from usual activities.  Some irritability.  Anxious. Disc her concerns meds are making her worse.  Extensive discussion about her treatment resistant status.  There is a consistent pattern of not taking the medicines long enough to get benefit because she believes the meds are making her worse.  However the symptoms she describes as side effects are exactly the same symptoms that she had prior to taking the medication RX for  the depression.  So it is not clear that these are actual side effects. This is true about the 2 most recent meds including Seroquel and Auvelity.  Recommend psychiatric consultation in hopes of improving her comfort level with taking prescribed medications for a sufficient length of time to provide benefit. Extensive discussion about ECT is the treatment of choice for treatment resistant depression.  Spravato may work if she can comply with consistency.  There are medication options but they take longer to work.   Plan:  Reduce clonidine to 0.1 mg BID DT bradycardia.  Talk with PCP about BP and low pulse problems which are interfering with her consistent compliance with Spravato.   Limit lorazepam to 3 -4  mg daily max. Excess use is the cause of slurring speech.  She must stop excess use or will have to stop the med. Stop Auvelity per her request.  But she has only been on the full dose for a little over a week and clearly has not had time to get  benefit from it.  She thinks maybe it is making her more  anxious. Reduce Seroquel from 150XR to 50 -100 mg at night IR.  She couldn't sleep when stopped it completely. Will not start new antidepressant until her SE issues are resolved or not. Get second psych opinion from Yehuda Budd MD or another psychiatrist.  H's sister is therapist in [place], MD, [facility]  05/16/2022 appointment with the following noted: Received Spravato 84 mg today as scheduled.  Tolerated it well without nausea or vomiting headache or chest pain or palpitations.  She stopped Auvelity as discussed last week. On her own, without physician input, she restarted Wellbutrin XL 450 mg every morning today.  She had taken it in the past.  She feels jittery and anxious. She feels less depressed than she did last week.  But she is still depressed without her usual range of affect.  She still is less social and less motivated than normal. Her primary care doctor increased the dose of losartan Plan: Stop Seroquel Reduce Wellbutrin XL to 300 mg every morning.  Starting the dose at 450 every morning is likely causing side effects of jitteriness and it should not be started at that have a dosage. Recommend she not change meds on her own without MDM put  05/23/2022 appointment with the following noted: Received Spravato 84 mg today as scheduled.  Tolerated it well without nausea or vomiting headache or chest pain or palpitations.  Has not dropped seroquel XR 300 mg 1/2 tablet nightly bc couldn't sleep without it. Has not tried lower dose quetiapine 50 mg HS Still feels depressed.   BP is better managed so far, just saw PCP.  BP is better today and infact is low today. Dropped clonidine as directed from 0.3 mg BID bc inadequate control of BP to 0.2 mg BID.  However she wants to increase it back to 0.3 mg twice daily because she feels it helped her anxiety better.  Wonders about increasing Wellbutrin for depression.  However she  has only been on 300 mg a day for a week.  She was on 450 mg daily in the past.  06/06/22 appt noted: Received Spravato 84 mg today as scheduled.  Tolerated it well without nausea or vomiting headache or chest pain or palpitations.  She is still depressed and anxious.  She wants to try to stop the Seroquel but cannot sleep without some of it.  She is taking lorazepam 1 mg 4 times daily and still having a lot of anxiety.  She wants to increase clonidine back to 0.3 mg twice daily.  She hopes for more improvement She recently went for a second psychiatric opinion as suggested the results of that are pending.  06/11/22 appt noted: Received Spravato 84 mg today as scheduled.  Tolerated it well without nausea or vomiting headache or chest pain or palpitations.  She is still depressed and anxious. Without much change.  Still hopeless, anhedonia, reduced inteterest and motivation.  Tolerating meds. Disc concerns Spravato is not hleping much. Plan: stop Seroquel and start olanzapine 10 mg HS for TRD and anxiety.  06/13/2022 appointment noted: Received Spravato 84 mg today as scheduled.  Tolerated it well without nausea or vomiting headache or chest pain or palpitations.  She is still depressed and anxious. Without much change.  Still hopeless, anhedonia, reduced inteterest and motivation.  Tolerating meds. Disc concerns Spravato is not helping much as hoped but is improving a bit in the last week. Tolerating meds. Continues Wellbutrin XL 450 AM, tolerating recently started olanzapine  10 mg HS.  Sleep is good.   Pending appt with [facility] consult.  06/18/22 appt noted: Received Spravato 84 mg today as scheduled.  Tolerated it well without nausea or vomiting headache or chest pain or palpitations.  Tolerating meds. Continues Wellbutrin XL 450 AM, tolerating recently started olanzapine  10 mg HS. Continues Adderall XR 20 amd and has tried to reduce lorazepam to $RemoveBefo'1mg'iJJaXCQAdur$  TID Sleep is good.   Pending appt with [place]  consult. Depression is a little bit better in the last week with a little improvement in emotional expression and interest.  She is pushing herself to be more active.  Her daughter thought she was a little better than she has been.  However she is still depressed and still not her normal self with anhedonia and reduced emotional expressiveness.  06/20/22 appt noted: Received Spravato 84 mg today as scheduled.  Tolerated it well without nausea or vomiting headache or chest pain or palpitations.  Tolerating meds with a little sleepiness. Continues Wellbutrin XL 450 AM, tolerating recently started olanzapine  10 mg HS. Continues Adderall XR 20 amd and has tried to reduce lorazepam to $RemoveBefo'1mg'yWFsAyIteIu$  TID Sleep is good.   Mood is improving.  Better funciton.  Anxiety is better with olanzapine. Still not herself and depression not gone with some anhedonia and social avoidance and feeling overwhelmed.  Past Psychiatric Medication Trials: fluoxetine, duloxetine, Viibryd, lamotrigine, Pristiq, sertraline, citalopram,  Trintellix anxious and SI Wellbutrin XL 450  Adderall, Adderall XR, Vyvanse, Ritalin, Strattera low dose NR Lorazepam Trazodone  Depakote,  lamotrigine cog complaints Lithium remotely Abilify 7.5  Vraylar 1.5 mg daily agitation and insomnia Rexulti insomnia Latuda 40 one dose, CO anxious and SI Seroquel XR 300  At visit November 12, 2019. We discussed Patient developed an increasingly severe alcohol dependence problem since her last visit in January.  She went to [facility] and has had no alcohol since then except 1 day.  She never abused stimulants but they took her off the stimulants at [facility].  Her ADD was markedly worse.  The Wellbutrin did not help the ADD.   D history lamotrigine rash at 66 yo  Review of Systems:  Review of Systems  Constitutional:  Positive for fatigue.  Cardiovascular:  Negative for palpitations.  Musculoskeletal:  Positive for arthralgias, back pain  and joint swelling.       SP hip surgery October 2020  Neurological:  Negative for dizziness and light-headedness.  Psychiatric/Behavioral:  Positive for decreased concentration and dysphoric mood. Negative for agitation, behavioral problems, confusion, hallucinations, self-injury, sleep disturbance and suicidal ideas. The patient is nervous/anxious. The patient is not hyperactive.     Medications: I have reviewed the patient's current medications.  Current Outpatient Medications  Medication Sig Dispense Refill   amLODipine (NORVASC) 2.5 MG tablet Take 2.5 mg by mouth daily.     amphetamine-dextroamphetamine (ADDERALL XR) 20 MG 24 hr capsule Take 1 capsule (20 mg total) by mouth every morning. 30 capsule 0   amphetamine-dextroamphetamine (ADDERALL XR) 20 MG 24 hr capsule Take 1 capsule (20 mg total) by mouth every morning. 30 capsule 0   buPROPion (WELLBUTRIN XL) 150 MG 24 hr tablet Take 3 tablets (450 mg total) by mouth daily. 90 tablet 0   cloNIDine (CATAPRES) 0.2 MG tablet Take 1 tablet (0.2 mg total) by mouth 2 (two) times daily. 60 tablet 1   Esketamine HCl, 84 MG Dose, (SPRAVATO, 84 MG DOSE,) 28 MG/DEVICE SOPK USE 3 SPRAYS IN EACH NOSTRIL TWICE WEEKLY 3 each 5  iron polysaccharides (NIFEREX) 150 MG capsule TAKE 1 CAPSULE BY MOUTH EVERY DAY 90 capsule 1   LORazepam (ATIVAN) 1 MG tablet Take 1 tablet (1 mg total) by mouth every 6 (six) hours as needed for anxiety. 100 tablet 0   losartan (COZAAR) 50 MG tablet Take 50 mg by mouth daily.     nebivolol (BYSTOLIC) 2.5 MG tablet Take 2.5 mg by mouth daily.     OLANZapine (ZYPREXA) 10 MG tablet Take 1 tablet (10 mg total) by mouth at bedtime. 30 tablet 0   No current facility-administered medications for this visit.    Medication Side Effects: None  Allergies:  Allergies  Allergen Reactions   Metronidazole Shortness Of Breath and Other (See Comments)    Heart pounding   Ferrlecit [Na Ferric Gluc Cplx In Sucrose] Other (See Comments)     Infusion reaction 05/12/2019    Past Medical History:  Diagnosis Date   ADHD    Anemia    Anxiety    Arthritis    Depression    Heart murmur    i went to see a cardiologit slast eyar  and i had zero plaque,    PONV (postoperative nausea and vomiting)    Recovering alcoholic in remission ([place])     Family History  Problem Relation Age of Onset   Atrial fibrillation Mother    CAD Father     Social History   Socioeconomic History   Marital status: Married    Spouse name: Not on file   Number of children: Not on file   Years of education: Not on file   Highest education level: Not on file  Occupational History   Not on file  Tobacco Use   Smoking status: Former    Types: Cigarettes    Quit date: 08/16/2003    Years since quitting: 18.8   Smokeless tobacco: Never   Tobacco comments:     08-28-2019 "i smoked 2 cigarettes in the last month since my father  passed"  Vaping Use   Vaping Use: Never used  Substance and Sexual Activity   Alcohol use: Yes    Alcohol/week: 10.0 standard drinks of alcohol    Types: 10 Glasses of wine per week   Drug use: No   Sexual activity: Not on file  Other Topics Concern   Not on file  Social History Narrative   Not on file   Social Determinants of Health   Financial Resource Strain: Not on file  Food Insecurity: Not on file  Transportation Needs: Not on file  Physical Activity: Not on file  Stress: Not on file  Social Connections: Not on file  Intimate Partner Violence: Not on file    Past Medical History, Surgical history, Social history, and Family history were reviewed and updated as appropriate.   Please see review of systems for further details on the patient's review from today.   Objective:   Physical Exam:  There were no vitals taken for this visit.  Physical Exam Constitutional:      General: She is not in acute distress. Neurological:     Mental Status: She is alert and oriented to person, place, and time.      Coordination: Coordination normal.     Gait: Gait normal.  Psychiatric:        Attention and Perception: Attention and perception normal.        Mood and Affect: Mood is anxious and depressed. Affect is not labile, blunt, angry, tearful  or inappropriate.        Speech: Speech is not rapid and pressured or slurred.        Behavior: Behavior is not slowed.        Thought Content: Thought content is not paranoid or delusional. Thought content does not include homicidal or suicidal ideation. Thought content does not include suicidal plan.        Cognition and Memory: Cognition normal. Memory is impaired. She does not exhibit impaired recent memory.        Judgment: Judgment normal.     Comments: Insight intact. No auditory or visual hallucinations. No delusions.  Depression  ongoing. Affect still depressed but less blunted than it was with some smiling a nd joking not present before No Sui intent plan      Lab Review:     Component Value Date/Time   NA 137 01/12/2021 1430   NA 140 11/18/2018 1544   K 3.8 01/12/2021 1430   CL 108 01/12/2021 1430   CO2 22 01/12/2021 1430   GLUCOSE 94 01/12/2021 1430   BUN 14 01/12/2021 1430   BUN 20 11/18/2018 1544   CREATININE 0.82 01/12/2021 1430   CALCIUM 8.9 01/12/2021 1430   PROT 6.6 01/12/2021 1430   ALBUMIN 3.9 01/12/2021 1430   AST 12 (L) 01/12/2021 1430   ALT 11 01/12/2021 1430   ALKPHOS 46 01/12/2021 1430   BILITOT 0.5 01/12/2021 1430   GFRNONAA >60 01/12/2021 1430   GFRAA >60 09/02/2019 0249   GFRAA >60 01/27/2019 0811       Component Value Date/Time   WBC 4.5 01/12/2021 1430   RBC 4.32 01/12/2021 1430   HGB 12.8 01/12/2021 1430   HGB 12.9 07/17/2019 0953   HCT 38.5 01/12/2021 1430   HCT 21.9 (L) 12/25/2018 1221   PLT 272 01/12/2021 1430   PLT 286 07/17/2019 0953   MCV 89.1 01/12/2021 1430   MCH 29.6 01/12/2021 1430   MCHC 33.2 01/12/2021 1430   RDW 12.4 01/12/2021 1430   LYMPHSABS 1.4 01/12/2021 1430   MONOABS 0.4  01/12/2021 1430   EOSABS 0.0 01/12/2021 1430   BASOSABS 0.0 01/12/2021 1430    No results found for: "POCLITH", "LITHIUM"   No results found for: "PHENYTOIN", "PHENOBARB", "VALPROATE", "CBMZ"   .res Assessment: Plan:    Recurrent major depression resistant to treatment ([place])  Generalized anxiety disorder  Attention deficit hyperactivity disorder (ADHD), predominantly inattentive type  Insomnia due to mental condition  Accelerated hypertension   She has treatment resistant major depression at this time.  Have  discussed some of her recent abnormal behaviors leading to this depressive episode getting worse which she says were associated with heavy use of delta 8 and not a manic episode.  She realizes now that that was not good for her.  She stopped all use of other drugs including those available over-the-counter such as delta 8 or any other THC related products.  She is no longer having any of those types of behaviors and instead is depressed. She remains persistently depressed with lack of interest and lack of feeling for things that normally she would have feelings about.  She has low motivation and energy.  She is sad and down.  She is less productive than usual.  Her concentration is poor.  She has high degree of anxiety as well. She has a very negative self-image and low self-esteem.  These are all uncharacteristic for her  Because of the question of cognitive issues we will  check TSH, B12 and folate.  Pending She has still not done these blood tests.  Blood pressure has become more normal, so she was able to receive Spravato.   Patient was administered Spravato 84 mg intranasally today.  The patient experienced the typical dissociation which gradually resolved over the 2-hour period of observation.  There were no complications.  Specifically the patient did not have nausea or vomiting or headache.  Blood pressures remained within normal ranges at the 40-minute and 2-hour  follow-up intervals.  By the time the 2-hour observation period was met the patient was alert and oriented and able to exit without assistance.  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  See nursing note for further details.  Failed multiple antidepressants.  Many of them were not actual failures but intolerances and it is unclear whether some of that was more connected with anxiety than true side effects.  1 example is the [place].  In general she does not want to try anything but an antidepressant but has failed all major categories of antidepressants except TCAs and MAO inhibitors which have not been tried.  Could consider a TCA but given her manic response to delta 8 there is some concern about the risk of a TCA triggering mania.  It could be combined with lithium but she generally is unfavorable about taking lithium.  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  She wants to continue Spravato though limited improvement so far.  We discussed discussed the side effects in detail as well as the protocol required to receive Spravato.    Started olanzapine 10 mg HS on 7.31. 23 and stopped Seroquel without px so far. This change needs more time to evaluate.  If she becomes to sleepy then reduce to 7.5 mg HS Discussed potential metabolic side effects associated with atypical antipsychotics, as well as potential risk for movement side effects. Advised pt to contact office if movement side effects occur.   continue Wellbutrin XL to prior dose 450 mg AM  Started Spravato 84 mg twice weekly on 03/16/2022.  But has missed several doses due to elevated blood pressure prior to administration  Adderall  XR 20 mg AM  Ok temporary Ativan 1 mg 3-4 times daily as needed anxiety but try to  cut it back. Is not ideal to use benzodiazepine with stimulant but because of the severity of her symptoms it has been necessary.  Hope to eventually eliminate the benzodiazepine.  Expected as her depression improves her anxiety will improve as well.  However lately her anxiety has been unmanageable.  We will expect that to improve as the depression improves.  She has headed insturctions to reduce this.  Continue clonidine 0.2 mg BID off label for anxiety and helps BP partially. BP is better controlled.  Discussed potential benefits, risks, and side effects of stimulants with patient to include increased heart rate, palpitations, insomnia, increased anxiety, increased irritability, or decreased appetite.  Instructed patient to contact office if experiencing any significant tolerability issues. She wants to return to usual dose of Adderall for ADD bc of mor poor cognitive function with reduction.  Also discussed that depression will impair cognitive function.  Discussed safety plan at length with patient.  Advised patient to contact office with any worsening signs and symptoms.  Instructed  patient to go to the [facility] emergency room for evaluation if experiencing any acute safety concerns, to include suicidal intent.  Have encouraged her to seek a second psychiatric opinion due to treatment resistant depression.  She has done so.  We have not received that letter yet. Have encouraged her to investigate the alternative to Spravato, [place] as another option.  She has an appointment pending to discuss this.  Rec she pursue [place] consultation.  Has Maintained sobriety  No med change today  FU with Spravato twice weekly unless starts TMS then will stop it.  Lynder Parents, MD, DFAPA     Please see After Visit Summary for patient specific instructions.  No future appointments.          No orders of the defined types were placed in this encounter.      -------------------------------

## 2022-06-22 NOTE — Progress Notes (Signed)
Nurse visit:   Patient arrived for her 103 th Spravato treatment. Pt is being treated for Treatment Resistant Depression, pt will be receiving 84 mg which will continue to be her maintenance dose, she continues receiving twice weekly as long as her b/p is stable.  Patient arrived and taken to treatment room. Confirmed she had a ride home which is her husband would be coming back to pick up pt when done and sometimes she needs to use [facility] if he is unable to pick her up. Pt's Spravato is ordered through [employer] and delivered to office, all Spravato medication is stored at doctors office per REMS/FDA guidelines. The medication is required to be locked behind two doors per FDA/REMS Protocol. Medication is also disposed of properly per regulations.      Began taking patient's vital signs at 9:40 AM 117/65, pulse 62. Instructed patient to blow her nose then recline back to a 45 degree angle. Gave patient first dose 28 mg nasal spray, each nasal spray administered in each nostril as directed and waited 5 minutes between the second and third dose. All 3 doses given pt did not complain of any nausea/vomiting, given a cup of water due to the taste after the administration of Spravato.  She listens to [employer] with spa or relaxing music.  Checked 40 minute vitals at 10:20 AM, 123/73, pulse was 83. Explained she would be monitored for a total time of 120 minutes. Discharge vitals were taken at 11:45 AM 131/85 P 59. Dr. Clovis Pu met with her and discussed her medication, they have also decided for her to do [place]. She does plan on starting treatment and when she does she will stop Spravato.  I walked pt to elevator, where her husband met her for the ride home. Recommend she go home and sleep or just relax on the couch. No driving, no intense activities. Verbalized understanding. Nurse was with pt a total of 70 minutes for clinical. Pt is coming back in on Wednesday, August 9 th. We will continue twice weekly at this  time.  Pt instructed to call office with any problems or questions.    LOT [id number] EXP APR 2025

## 2022-06-25 ENCOUNTER — Ambulatory Visit: Payer: 59

## 2022-06-25 ENCOUNTER — Ambulatory Visit (INDEPENDENT_AMBULATORY_CARE_PROVIDER_SITE_OTHER): Payer: 59 | Admitting: Adult Health

## 2022-06-25 VITALS — BP 116/71 | HR 64

## 2022-06-25 DIAGNOSIS — F339 Major depressive disorder, recurrent, unspecified: Secondary | ICD-10-CM | POA: Diagnosis not present

## 2022-06-25 DIAGNOSIS — F900 Attention-deficit hyperactivity disorder, predominantly inattentive type: Secondary | ICD-10-CM

## 2022-06-25 MED ORDER — AMPHETAMINE-DEXTROAMPHET ER 20 MG PO CP24: 20.0000 mg | ORAL_CAPSULE | ORAL | 0 refills | Status: DC

## 2022-06-25 NOTE — Progress Notes (Signed)
Laura Chang [id number] 1956/06/17 66 y.o.  Subjective:   Patient ID:  Laura Chang is a 66 y.o. (DOB April 27, 1956) female.  Chief Complaint: No chief complaint on file.   HPI Laura Chang presents to the office today for follow-up of TRD - treatment resistant depression.  Patient was administered Spravato 84 mg intranasally today. She was observed by provider throughout [facility] treatment. The patient experienced the typical dissociation which gradually resolved over the 2-hour period of observation. There were no complications. Specifically, the patient did not have  any untoward side effects - feeling disconnected from herself, her thoughts, feelings and things around her, dizziness, nausea, feeling sleepy, decreased feeling of sensitivity (numbness) spinning sensation, feeling anxious, lack of energy, increased blood pressure, nausea, vomiting, feeling drink, or feeling happy or very excited.nausea or vomiting or headache. Blood pressures remained within normal ranges at the 40-minute and 2-hour follow-up intervals. By the time the 2-hour observation period was met the patient was alert and oriented and able to exit without assistance. 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 Stating "I feel good today". See nursing note for further details.    ECT-MADRS    Flowsheet Row Clinical Support from 05/21/2022 in [facility] Visit from 03/02/2022 in [facility]  MADRS Total Score 27 46        Review of Systems:  Review of Systems  Musculoskeletal:  Negative for gait problem.  Neurological:  Negative for tremors.  Psychiatric/Behavioral:         Please refer to HPI    Medications: I have reviewed the patient's current medications.  Current Outpatient Medications  Medication Sig Dispense Refill   amLODipine (NORVASC) 2.5 MG tablet Take 2.5 mg by mouth  daily.     amphetamine-dextroamphetamine (ADDERALL XR) 20 MG 24 hr capsule Take 1 capsule (20 mg total) by mouth every morning. 30 capsule 0   amphetamine-dextroamphetamine (ADDERALL XR) 20 MG 24 hr capsule Take 1 capsule (20 mg total) by mouth every morning. 30 capsule 0   buPROPion (WELLBUTRIN XL) 150 MG 24 hr tablet Take 3 tablets (450 mg total) by mouth daily. 90 tablet 0   cloNIDine (CATAPRES) 0.2 MG tablet Take 1 tablet (0.2 mg total) by mouth 2 (two) times daily. 60 tablet 1   Esketamine HCl, 84 MG Dose, (SPRAVATO, 84 MG DOSE,) 28 MG/DEVICE SOPK USE 3 SPRAYS IN EACH NOSTRIL TWICE WEEKLY 3 each 5   iron polysaccharides (NIFEREX) 150 MG capsule TAKE 1 CAPSULE BY MOUTH EVERY DAY 90 capsule 1   LORazepam (ATIVAN) 1 MG tablet Take 1 tablet (1 mg total) by mouth every 6 (six) hours as needed for anxiety. 100 tablet 0   losartan (COZAAR) 50 MG tablet Take 50 mg by mouth daily.     nebivolol (BYSTOLIC) 2.5 MG tablet Take 2.5 mg by mouth daily.     OLANZapine (ZYPREXA) 10 MG tablet Take 1 tablet (10 mg total) by mouth at bedtime. 30 tablet 0   No current facility-administered medications for this visit.    Medication Side Effects: None  Allergies:  Allergies  Allergen Reactions   Metronidazole Shortness Of Breath and Other (See Comments)    Heart pounding   Ferrlecit [Na Ferric Gluc Cplx In Sucrose] Other (See Comments)    Infusion reaction 05/12/2019    Past Medical History:  Diagnosis Date   ADHD    Anemia    Anxiety  Arthritis    Depression    Heart murmur    i went to see a cardiologit slast eyar  and i had zero plaque,    PONV (postoperative nausea and vomiting)    Recovering alcoholic in remission [facility])     Past Medical History, Surgical history, Social history, and Family history were reviewed and updated as appropriate.   Please see review of systems for further details on the patient's review from today.   Objective:   Physical Exam:  There were no vitals taken  for this visit.  Physical Exam Constitutional:      General: She is not in acute distress. Musculoskeletal:        General: No deformity.  Neurological:     Mental Status: She is alert and oriented to person, place, and time.     Coordination: Coordination normal.  Psychiatric:        Attention and Perception: Attention and perception normal. She does not perceive auditory or visual hallucinations.        Mood and Affect: Mood normal. Mood is not anxious or depressed. Affect is not labile, blunt, angry or inappropriate.        Speech: Speech normal.        Behavior: Behavior normal.        Thought Content: Thought content normal. Thought content is not paranoid or delusional. Thought content does not include homicidal or suicidal ideation. Thought content does not include homicidal or suicidal plan.        Cognition and Memory: Cognition and memory normal.        Judgment: Judgment normal.     Comments: Insight intact     Lab Review:     Component Value Date/Time   NA 137 01/12/2021 1430   NA 140 11/18/2018 1544   K 3.8 01/12/2021 1430   CL 108 01/12/2021 1430   CO2 22 01/12/2021 1430   GLUCOSE 94 01/12/2021 1430   BUN 14 01/12/2021 1430   BUN 20 11/18/2018 1544   CREATININE 0.82 01/12/2021 1430   CALCIUM 8.9 01/12/2021 1430   PROT 6.6 01/12/2021 1430   ALBUMIN 3.9 01/12/2021 1430   AST 12 (L) 01/12/2021 1430   ALT 11 01/12/2021 1430   ALKPHOS 46 01/12/2021 1430   BILITOT 0.5 01/12/2021 1430   GFRNONAA >60 01/12/2021 1430   GFRAA >60 09/02/2019 0249   GFRAA >60 01/27/2019 0811       Component Value Date/Time   WBC 4.5 01/12/2021 1430   RBC 4.32 01/12/2021 1430   HGB 12.8 01/12/2021 1430   HGB 12.9 07/17/2019 0953   HCT 38.5 01/12/2021 1430   HCT 21.9 (L) 12/25/2018 1221   PLT 272 01/12/2021 1430   PLT 286 07/17/2019 0953   MCV 89.1 01/12/2021 1430   MCH 29.6 01/12/2021 1430   MCHC 33.2 01/12/2021 1430   RDW 12.4 01/12/2021 1430   LYMPHSABS 1.4 01/12/2021  1430   MONOABS 0.4 01/12/2021 1430   EOSABS 0.0 01/12/2021 1430   BASOSABS 0.0 01/12/2021 1430    No results found for: "POCLITH", "LITHIUM"   No results found for: "PHENYTOIN", "PHENOBARB", "VALPROATE", "CBMZ"   .res Assessment: Plan:    Plan:  Continue Spravato treatment.   Diagnoses and all orders for this visit:  Recurrent major depression resistant to treatment [facility])  Attention deficit hyperactivity disorder (ADHD), predominantly inattentive type -     amphetamine-dextroamphetamine (ADDERALL XR) 20 MG 24 hr capsule; Take 1 capsule (20 mg total) by mouth  every morning.     Please see After Visit Summary for patient specific instructions.  Future Appointments  Date Time Provider [place]  06/27/2022  9:30 AM CP-NURSE CP-CP None  06/27/2022  9:30 AM Lesle Chris A, NP CP-CP None    No orders of the defined types were placed in this encounter.   -------------------------------

## 2022-06-26 NOTE — Progress Notes (Signed)
Nurse visit:   Patient arrived for her 54 th Spravato treatment. Pt is being treated for Treatment Resistant Depression, pt will be receiving 84 mg which will continue to be her maintenance dose, she continues receiving twice weekly as long as her b/p is stable.  Patient arrived and taken to treatment room. Confirmed she had a ride home which is her husband would be coming back to pick up pt when done and sometimes she needs to use [facility] if he is unable to pick her up. Pt's Spravato is ordered through [employer] and delivered to office, all Spravato medication is stored at doctors office per REMS/FDA guidelines. The medication is required to be locked behind two doors per FDA/REMS Protocol. Medication is also disposed of properly per regulations.      Began taking patient's vital signs at 9:55 AM 124/81, pulse 68. Instructed patient to blow her nose then recline back to a 45 degree angle. Gave patient first dose 28 mg nasal spray, each nasal spray administered in each nostril as directed and waited 5 minutes between the second and third dose. All 3 doses given pt did not complain of any nausea/vomiting, given a cup of water due to the taste after the administration of Spravato.  She listens to [employer] with spa or relaxing music.  Checked 40 minute vitals at 10:35 AM, 108/67, pulse was 60. Explained she would be monitored for a total time of 120 minutes. Discharge vitals were taken at 11:55 AM 116/71 P 64. Deloria Lair, NP met with her and discussed her medication, they have also decided for her to do [place]. She does plan on starting treatment and when she does she will stop Spravato.  I walked pt to elevator, where her husband met her for the ride home. Recommend she go home and sleep or just relax on the couch. No driving, no intense activities. Verbalized understanding. Nurse was with pt a total of 70 minutes for clinical. Pt is coming back in on Wednesday, August 16 th. We will continue twice weekly  at this time.  Pt instructed to call office with any problems or questions.      LOT [id number] EXP APR 2025

## 2022-06-26 NOTE — Progress Notes (Signed)
Nurse visit:   Patient arrived for her 50 th Spravato treatment. Pt is being treated for Treatment Resistant Depression, pt will be receiving 84 mg which will continue to be her maintenance dose, she continues receiving twice weekly as long as her b/p is stable.  Patient arrived and taken to treatment room. Confirmed she had a ride home which is her husband would be coming back to pick up pt when done and sometimes she needs to use [facility] if he is unable to pick her up. Pt's Spravato is ordered through [employer] and delivered to office, all Spravato medication is stored at doctors office per REMS/FDA guidelines. The medication is required to be locked behind two doors per FDA/REMS Protocol. Medication is also disposed of properly per regulations.      Began taking patient's vital signs at 2:15 PM 120/64, pulse 83. Instructed patient to blow her nose then recline back to a 45 degree angle. Gave patient first dose 28 mg nasal spray, each nasal spray administered in each nostril as directed and waited 5 minutes between the second and third dose. All 3 doses given pt did not complain of any nausea/vomiting, given a cup of water due to the taste after the administration of Spravato.  She listens to [employer] with spa or relaxing music.  Checked 40 minute vitals at 3:00 PM, 103/56, pulse was 63. Explained she would be monitored for a total time of 120 minutes. Discharge vitals were taken at 4:10 PM 87/51 P 68. Dr. Clovis Pu met with her and discussed her medication, they have also decided for her to do [place]. She does plan on starting treatment and when she does she will stop Spravato.  I walked pt to elevator, where her husband met her for the ride home. Recommend she go home and sleep or just relax on the couch. No driving, no intense activities. Verbalized understanding. Nurse was with pt a total of 70 minutes for clinical. Pt is coming back in on Monday, August 14 th. We will continue twice weekly at this time.   Pt instructed to call office with any problems or questions.      LOT [id number] EXP APR 2025

## 2022-06-27 ENCOUNTER — Ambulatory Visit: Payer: 59

## 2022-06-27 ENCOUNTER — Ambulatory Visit (INDEPENDENT_AMBULATORY_CARE_PROVIDER_SITE_OTHER): Payer: 59 | Admitting: Behavioral Health

## 2022-06-27 VITALS — BP 119/73 | HR 62

## 2022-06-27 DIAGNOSIS — F339 Major depressive disorder, recurrent, unspecified: Secondary | ICD-10-CM

## 2022-06-27 MED ORDER — OLANZAPINE 7.5 MG PO TABS: 7.5000 mg | ORAL_TABLET | Freq: Every day | ORAL | 0 refills | Status: DC

## 2022-06-27 NOTE — Progress Notes (Signed)
Laura Chang [id number] 11/04/1956 66 y.o.  Subjective:   Patient ID:  Laura Chang is a 66 y.o. (DOB [birth year]) female.  Chief Complaint: No chief complaint on file.   HPI Kessler D Aikman presents to the office today for follow-up of TRD - treatment resistant depression.   Patient was administered Spravato 84 mg intranasally today. She was observed by provider throughout [facility] treatment. The patient experienced the typical dissociation which gradually resolved over the 2-hour period of observation. There were no complications. Specifically, the patient did not have  any untoward side effects - feeling disconnected from herself, her thoughts, feelings and things around her, dizziness, nausea, feeling sleepy, decreased feeling of sensitivity (numbness) spinning sensation, feeling anxious, lack of energy, increased blood pressure, nausea, vomiting, feeling drink, or feeling happy or very excited.nausea or vomiting or headache. Blood pressures remained within normal ranges at the 40-minute and 2-hour follow-up intervals. By the time the 2-hour observation period was met the patient was alert and oriented and able to exit without assistance. 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 Stating "made great progress today, can tell a difference since last visit.  See nursing note for further details.            ECT-MADRS    Flowsheet Row Clinical Support from 05/21/2022 in [place] Visit from 03/02/2022 in [facility]  MADRS Total Score 27 46        Review of Systems:  Review of Systems  Constitutional: Negative.   Allergic/Immunologic: Negative.   Neurological: Negative.   Psychiatric/Behavioral:  Positive for dysphoric mood.     Medications: I have reviewed the patient's current medications.  Current Outpatient Medications  Medication Sig  Dispense Refill   amLODipine (NORVASC) 2.5 MG tablet Take 2.5 mg by mouth daily.     amphetamine-dextroamphetamine (ADDERALL XR) 20 MG 24 hr capsule Take 1 capsule (20 mg total) by mouth every morning. 30 capsule 0   amphetamine-dextroamphetamine (ADDERALL XR) 20 MG 24 hr capsule Take 1 capsule (20 mg total) by mouth every morning. 30 capsule 0   buPROPion (WELLBUTRIN XL) 150 MG 24 hr tablet Take 3 tablets (450 mg total) by mouth daily. 90 tablet 0   cloNIDine (CATAPRES) 0.2 MG tablet Take 1 tablet (0.2 mg total) by mouth 2 (two) times daily. 60 tablet 1   Esketamine HCl, 84 MG Dose, (SPRAVATO, 84 MG DOSE,) 28 MG/DEVICE SOPK USE 3 SPRAYS IN EACH NOSTRIL TWICE WEEKLY 3 each 5   iron polysaccharides (NIFEREX) 150 MG capsule TAKE 1 CAPSULE BY MOUTH EVERY DAY 90 capsule 1   LORazepam (ATIVAN) 1 MG tablet Take 1 tablet (1 mg total) by mouth every 6 (six) hours as needed for anxiety. 100 tablet 0   losartan (COZAAR) 50 MG tablet Take 50 mg by mouth daily.     nebivolol (BYSTOLIC) 2.5 MG tablet Take 2.5 mg by mouth daily.     OLANZapine (ZYPREXA) 10 MG tablet Take 1 tablet (10 mg total) by mouth at bedtime. 30 tablet 0   No current facility-administered medications for this visit.    Medication Side Effects: None  Allergies:  Allergies  Allergen Reactions   Metronidazole Shortness Of Breath and Other (See Comments)    Heart pounding   Ferrlecit [Na Ferric Gluc Cplx In Sucrose] Other (See Comments)    Infusion reaction 05/12/2019    Past Medical History:  Diagnosis Date  ADHD    Anemia    Anxiety    Arthritis    Depression    Heart murmur    i went to see a cardiologit slast eyar  and i had zero plaque,    PONV (postoperative nausea and vomiting)    Recovering alcoholic in remission [facility])     Past Medical History, Surgical history, Social history, and Family history were reviewed and updated as appropriate.   Please see review of systems for further details on the patient's review  from today.   Objective:   Physical Exam:  There were no vitals taken for this visit.  Physical Exam Psychiatric:        Attention and Perception: Attention and perception normal.        Mood and Affect: Mood and affect normal.        Speech: Speech normal.        Behavior: Behavior normal. Behavior is cooperative.        Cognition and Memory: Cognition and memory normal.        Judgment: Judgment normal.     Lab Review:     Component Value Date/Time   NA 137 01/12/2021 1430   NA 140 11/18/2018 1544   K 3.8 01/12/2021 1430   CL 108 01/12/2021 1430   CO2 22 01/12/2021 1430   GLUCOSE 94 01/12/2021 1430   BUN 14 01/12/2021 1430   BUN 20 11/18/2018 1544   CREATININE 0.82 01/12/2021 1430   CALCIUM 8.9 01/12/2021 1430   PROT 6.6 01/12/2021 1430   ALBUMIN 3.9 01/12/2021 1430   AST 12 (L) 01/12/2021 1430   ALT 11 01/12/2021 1430   ALKPHOS 46 01/12/2021 1430   BILITOT 0.5 01/12/2021 1430   GFRNONAA >60 01/12/2021 1430   GFRAA >60 09/02/2019 0249   GFRAA >60 01/27/2019 0811       Component Value Date/Time   WBC 4.5 01/12/2021 1430   RBC 4.32 01/12/2021 1430   HGB 12.8 01/12/2021 1430   HGB 12.9 07/17/2019 0953   HCT 38.5 01/12/2021 1430   HCT 21.9 (L) 12/25/2018 1221   PLT 272 01/12/2021 1430   PLT 286 07/17/2019 0953   MCV 89.1 01/12/2021 1430   MCH 29.6 01/12/2021 1430   MCHC 33.2 01/12/2021 1430   RDW 12.4 01/12/2021 1430   LYMPHSABS 1.4 01/12/2021 1430   MONOABS 0.4 01/12/2021 1430   EOSABS 0.0 01/12/2021 1430   BASOSABS 0.0 01/12/2021 1430    No results found for: "POCLITH", "LITHIUM"   No results found for: "PHENYTOIN", "PHENOBARB", "VALPROATE", "CBMZ"   .res Assessment: Plan:    Plan:  Continue Spravato treatment as required. Will reassess after each visit.      Diagnoses and all orders for this visit:  Recurrent major depression resistant to treatment [facility])     Please see After Visit Summary for patient specific instructions.  No future  appointments.  No orders of the defined types were placed in this encounter.   -------------------------------

## 2022-06-27 NOTE — Progress Notes (Signed)
Patient arrived for her 30 th Spravato treatment. Pt is being treated for Treatment Resistant Depression, pt will be receiving 84 mg which will continue to be her maintenance dose, she continues receiving twice weekly as long as her b/p is stable.  Patient arrived and taken to treatment room. Confirmed she had a ride home which is her husband would be coming back to pick up pt when done and sometimes she needs to use [facility] if he is unable to pick her up. Pt's Spravato is ordered through [employer] and delivered to office, all Spravato medication is stored at doctors office per REMS/FDA guidelines. The medication is required to be locked behind two doors per FDA/REMS Protocol. Medication is also disposed of properly per regulations.      Began taking patient's vital signs at 9:40 AM 117/75, pulse 62. Pt presents as feeling better, she looks happier, she reports starting to feel like her old self again. She did report Sunday wasn't a good day but a few things happened with her family this weekend and it bothered her. She was ready to get started with her treatment, instructed patient to blow her nose then recline back to a 45 degree angle. Gave patient first dose 28 mg nasal spray, each nasal spray administered in each nostril as directed and waited 5 minutes between the second and third dose. All 3 doses given pt did not complain of any nausea/vomiting, given a cup of water due to the taste after the administration of Spravato.  She listens to [employer] with spa or relaxing music.  Checked 40 minute vitals at 10:35 AM, 116/70, pulse was 73. Explained she would be monitored for a total time of 120 minutes. Discharge vitals were taken at 11:40 AM 119/73 P 62. Lesle Chris, NP met with her and discussed her medication. It was discussed an increase of her Olanzapine to 7.5 mg at hs. Instructed her I would send her Rx but a PA would probably need to be done.  I walked pt to elevator, where her husband met her for  the ride home. Recommend she go home and sleep or just relax on the couch. No driving, no intense activities. Verbalized understanding. Nurse was with pt a total of 70 minutes for clinical. Pt is coming back in on Monday, August 21st. We will continue twice weekly at this time.  Pt instructed to call office with any problems or questions.      LOT [id number] EXP APR 2025

## 2022-06-29 ENCOUNTER — Other Ambulatory Visit: Payer: Self-pay | Admitting: Psychiatry

## 2022-06-29 DIAGNOSIS — F339 Major depressive disorder, recurrent, unspecified: Secondary | ICD-10-CM

## 2022-07-02 ENCOUNTER — Ambulatory Visit (INDEPENDENT_AMBULATORY_CARE_PROVIDER_SITE_OTHER): Payer: 59 | Admitting: Behavioral Health

## 2022-07-02 ENCOUNTER — Ambulatory Visit: Payer: 59

## 2022-07-02 VITALS — BP 132/82 | HR 68

## 2022-07-02 DIAGNOSIS — F339 Major depressive disorder, recurrent, unspecified: Secondary | ICD-10-CM

## 2022-07-02 NOTE — Progress Notes (Signed)
Laura Chang [id number] October 01, 1956 66 y.o.  Subjective:   Patient ID:  Laura Chang is a 66 y.o. (DOB [birth year]) female.  Chief Complaint: No chief complaint on file.   HPI  Laura Chang presents to the office today for follow-up of TRD - treatment resistant depression.   Patient was administered Spravato 84 mg intranasally today. She was observed by provider throughout [facility] treatment. The patient experienced the typical dissociation which gradually resolved over the 2-hour period of observation. There were no complications. Specifically, the patient did not have  any untoward side effects - feeling disconnected from herself, her thoughts, feelings and things around her, dizziness, nausea, feeling sleepy, decreased feeling of sensitivity (numbness) spinning sensation, feeling anxious, lack of energy, increased blood pressure, nausea, vomiting, feeling drink, or feeling happy or very excited.nausea or vomiting or headache. Blood pressures remained within normal ranges at the 40-minute and 2-hour follow-up intervals. By the time the 2-hour observation period was met the patient was alert and oriented and able to exit without assistance. 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 Stating "made great progress today, can tell a difference since last visit.  Rates depression 4/10 today.  See nursing note for further details.      ECT-MADRS    Flowsheet Row Clinical Support from 05/21/2022 in [place] Visit from 03/02/2022 in [facility]  MADRS Total Score 27 46        Review of Systems:  Review of Systems  Constitutional: Negative.   Allergic/Immunologic: Negative.   Neurological: Negative.   Psychiatric/Behavioral:  Positive for dysphoric mood. The patient is nervous/anxious.     Medications: I have reviewed the patient's current  medications.  Current Outpatient Medications  Medication Sig Dispense Refill   amLODipine (NORVASC) 2.5 MG tablet Take 2.5 mg by mouth daily.     amphetamine-dextroamphetamine (ADDERALL XR) 20 MG 24 hr capsule Take 1 capsule (20 mg total) by mouth every morning. 30 capsule 0   amphetamine-dextroamphetamine (ADDERALL XR) 20 MG 24 hr capsule Take 1 capsule (20 mg total) by mouth every morning. 30 capsule 0   buPROPion (WELLBUTRIN XL) 150 MG 24 hr tablet TAKE 3 TABLETS BY MOUTH DAILY. 90 tablet 0   cloNIDine (CATAPRES) 0.2 MG tablet Take 1 tablet (0.2 mg total) by mouth 2 (two) times daily. 60 tablet 1   Esketamine HCl, 84 MG Dose, (SPRAVATO, 84 MG DOSE,) 28 MG/DEVICE SOPK USE 3 SPRAYS IN EACH NOSTRIL TWICE WEEKLY 3 each 5   iron polysaccharides (NIFEREX) 150 MG capsule TAKE 1 CAPSULE BY MOUTH EVERY DAY 90 capsule 1   LORazepam (ATIVAN) 1 MG tablet Take 1 tablet (1 mg total) by mouth every 6 (six) hours as needed for anxiety. 100 tablet 0   losartan (COZAAR) 50 MG tablet Take 50 mg by mouth daily.     nebivolol (BYSTOLIC) 2.5 MG tablet Take 2.5 mg by mouth daily.     OLANZapine (ZYPREXA) 10 MG tablet Take 1 tablet (10 mg total) by mouth at bedtime. 30 tablet 0   OLANZapine (ZYPREXA) 7.5 MG tablet Take 1 tablet (7.5 mg total) by mouth at bedtime. 30 tablet 0   No current facility-administered medications for this visit.    Medication Side Effects: None  Allergies:  Allergies  Allergen Reactions   Metronidazole Shortness Of Breath and Other (See Comments)    Heart pounding   Ferrlecit [Na Ferric Gluc Cplx  In Sucrose] Other (See Comments)    Infusion reaction 05/12/2019    Past Medical History:  Diagnosis Date   ADHD    Anemia    Anxiety    Arthritis    Depression    Heart murmur    i went to see a cardiologit slast eyar  and i had zero plaque,    PONV (postoperative nausea and vomiting)    Recovering alcoholic in remission [facility])     Past Medical History, Surgical history, Social  history, and Family history were reviewed and updated as appropriate.   Please see review of systems for further details on the patient's review from today.   Objective:   Physical Exam:  There were no vitals taken for this visit.  Physical Exam Psychiatric:        Attention and Perception: Attention and perception normal.        Mood and Affect: Mood and affect normal.        Speech: Speech normal.        Behavior: Behavior normal. Behavior is cooperative.        Cognition and Memory: Cognition and memory normal.        Judgment: Judgment normal.     Lab Review:     Component Value Date/Time   NA 137 01/12/2021 1430   NA 140 11/18/2018 1544   K 3.8 01/12/2021 1430   CL 108 01/12/2021 1430   CO2 22 01/12/2021 1430   GLUCOSE 94 01/12/2021 1430   BUN 14 01/12/2021 1430   BUN 20 11/18/2018 1544   CREATININE 0.82 01/12/2021 1430   CALCIUM 8.9 01/12/2021 1430   PROT 6.6 01/12/2021 1430   ALBUMIN 3.9 01/12/2021 1430   AST 12 (L) 01/12/2021 1430   ALT 11 01/12/2021 1430   ALKPHOS 46 01/12/2021 1430   BILITOT 0.5 01/12/2021 1430   GFRNONAA >60 01/12/2021 1430   GFRAA >60 09/02/2019 0249   GFRAA >60 01/27/2019 0811       Component Value Date/Time   WBC 4.5 01/12/2021 1430   RBC 4.32 01/12/2021 1430   HGB 12.8 01/12/2021 1430   HGB 12.9 07/17/2019 0953   HCT 38.5 01/12/2021 1430   HCT 21.9 (L) 12/25/2018 1221   PLT 272 01/12/2021 1430   PLT 286 07/17/2019 0953   MCV 89.1 01/12/2021 1430   MCH 29.6 01/12/2021 1430   MCHC 33.2 01/12/2021 1430   RDW 12.4 01/12/2021 1430   LYMPHSABS 1.4 01/12/2021 1430   MONOABS 0.4 01/12/2021 1430   EOSABS 0.0 01/12/2021 1430   BASOSABS 0.0 01/12/2021 1430    No results found for: "POCLITH", "LITHIUM"   No results found for: "PHENYTOIN", "PHENOBARB", "VALPROATE", "CBMZ"   .res Assessment: Plan:      Plan:   Continue Spravato treatment as required. Will reassess after each visit.          Diagnoses and all orders for  this visit:  Recurrent major depression resistant to treatment [facility])     Please see After Visit Summary for patient specific instructions.  Future Appointments  Date Time Provider Josephine  07/04/2022  9:30 AM CP-NURSE CP-CP None  07/04/2022  9:30 AM Lesle Chris A, NP CP-CP None    No orders of the defined types were placed in this encounter.   -------------------------------

## 2022-07-03 ENCOUNTER — Other Ambulatory Visit: Payer: Self-pay | Admitting: Psychiatry

## 2022-07-03 DIAGNOSIS — F339 Major depressive disorder, recurrent, unspecified: Secondary | ICD-10-CM

## 2022-07-03 DIAGNOSIS — F411 Generalized anxiety disorder: Secondary | ICD-10-CM

## 2022-07-03 DIAGNOSIS — F5105 Insomnia due to other mental disorder: Secondary | ICD-10-CM

## 2022-07-04 ENCOUNTER — Ambulatory Visit: Payer: 59

## 2022-07-04 ENCOUNTER — Other Ambulatory Visit: Payer: Self-pay | Admitting: Psychiatry

## 2022-07-04 ENCOUNTER — Ambulatory Visit (INDEPENDENT_AMBULATORY_CARE_PROVIDER_SITE_OTHER): Payer: 59 | Admitting: Behavioral Health

## 2022-07-04 ENCOUNTER — Telehealth: Payer: Self-pay

## 2022-07-04 VITALS — BP 119/78 | HR 75

## 2022-07-04 DIAGNOSIS — F339 Major depressive disorder, recurrent, unspecified: Secondary | ICD-10-CM

## 2022-07-04 DIAGNOSIS — I10 Essential (primary) hypertension: Secondary | ICD-10-CM

## 2022-07-04 DIAGNOSIS — F411 Generalized anxiety disorder: Secondary | ICD-10-CM

## 2022-07-04 NOTE — Telephone Encounter (Signed)
Pt's prior authorization for SPRAVATO 84 MG expired and insurance requires a renewal. MADRS was completed by Avelina Laine, NP today and submitted with additional office notes for review. According to pt's insurance they are recommending she go down to once a week treatments. She is on treatment # 29 today. Informed pt of information and let her know once she is assessed again by Dr. Jennelle Human on Monday, August 28th and he decides to increase back to twice a week then we would have to submit a new PA. She understood.   Pending response from [facility].

## 2022-07-04 NOTE — Progress Notes (Signed)
Laura Chang [id number] [birth year] 66 y.o.  Subjective:   Patient ID:  Laura Chang is a 66 y.o. (DOB February 23, 1956) female.  Chief Complaint: No chief complaint on file.   HPI  Laura Chang presents to the office today for follow-up of TRD - treatment resistant depression.   Patient was administered Spravato 84 mg intranasally today. She was observed by provider throughout [facility] treatment. The patient experienced the typical dissociation which gradually resolved over the 2-hour period of observation. There were no complications. Specifically, the patient did not have  any untoward side effects - feeling disconnected from herself, her thoughts, feelings and things around her, dizziness, nausea, feeling sleepy, decreased feeling of sensitivity (numbness) spinning sensation, feeling anxious, lack of energy, increased blood pressure, nausea, vomiting, feeling drink, or feeling happy or very excited.nausea or vomiting or headache. Blood pressures remained within normal ranges at the 40-minute and 2-hour follow-up intervals. By the time the 2-hour observation period was met the patient was alert and oriented and able to exit without assistance. 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 Stating "made great progress today, can tell a difference since last visit.  Rates depression 4/10  again today.  MADRS score today 15. Last score from 7/10 was 27,  See nursing note for further details.       ECT-MADRS     ECT-MADRS    Flowsheet Row Clinical Support from 07/04/2022 in [place] from 05/21/2022 in [place] Visit from 03/02/2022 in [facility]  MADRS Total Score 15 27 46        Review of Systems:  Review of Systems  Constitutional: Negative.   Allergic/Immunologic: Negative.   Neurological: Negative.    Psychiatric/Behavioral:  Positive for dysphoric mood. The patient is nervous/anxious.     Medications: I have reviewed the patient's current medications.  Current Outpatient Medications  Medication Sig Dispense Refill   amLODipine (NORVASC) 2.5 MG tablet Take 2.5 mg by mouth daily.     amphetamine-dextroamphetamine (ADDERALL XR) 20 MG 24 hr capsule Take 1 capsule (20 mg total) by mouth every morning. 30 capsule 0   amphetamine-dextroamphetamine (ADDERALL XR) 20 MG 24 hr capsule Take 1 capsule (20 mg total) by mouth every morning. 30 capsule 0   buPROPion (WELLBUTRIN XL) 150 MG 24 hr tablet TAKE 3 TABLETS BY MOUTH DAILY. 90 tablet 0   cloNIDine (CATAPRES) 0.2 MG tablet Take 1 tablet (0.2 mg total) by mouth 2 (two) times daily. 60 tablet 1   Esketamine HCl, 84 MG Dose, (SPRAVATO, 84 MG DOSE,) 28 MG/DEVICE SOPK USE 3 SPRAYS IN EACH NOSTRIL TWICE WEEKLY 3 each 5   iron polysaccharides (NIFEREX) 150 MG capsule TAKE 1 CAPSULE BY MOUTH EVERY DAY 90 capsule 1   LORazepam (ATIVAN) 1 MG tablet Take 1 tablet (1 mg total) by mouth every 6 (six) hours as needed for anxiety. 100 tablet 0   losartan (COZAAR) 50 MG tablet Take 50 mg by mouth daily.     nebivolol (BYSTOLIC) 2.5 MG tablet Take 2.5 mg by mouth daily.     OLANZapine (ZYPREXA) 10 MG tablet TAKE 1 TABLET BY MOUTH EVERYDAY AT BEDTIME 90 tablet 0   OLANZapine (ZYPREXA) 7.5 MG tablet Take 1 tablet (7.5 mg total) by mouth at bedtime. 30 tablet 0   No current facility-administered medications for this visit.    Medication Side Effects: None  Allergies:  Allergies  Allergen Reactions   Metronidazole Shortness Of Breath and Other (See Comments)    Heart pounding   Ferrlecit [Na Ferric Gluc Cplx In Sucrose] Other (See Comments)    Infusion reaction 05/12/2019    Past Medical History:  Diagnosis Date   ADHD    Anemia    Anxiety    Arthritis    Depression    Heart murmur    i went to see a cardiologit slast eyar  and i had zero plaque,     PONV (postoperative nausea and vomiting)    Recovering alcoholic in remission [facility])     Past Medical History, Surgical history, Social history, and Family history were reviewed and updated as appropriate.   Please see review of systems for further details on the patient's review from today.   Objective:   Physical Exam:  There were no vitals taken for this visit.  Physical Exam Psychiatric:        Attention and Perception: Attention and perception normal.        Mood and Affect: Mood and affect normal.        Speech: Speech normal.        Behavior: Behavior normal. Behavior is cooperative.        Cognition and Memory: Cognition and memory normal.        Judgment: Judgment normal.     Lab Review:     Component Value Date/Time   NA 137 01/12/2021 1430   NA 140 11/18/2018 1544   K 3.8 01/12/2021 1430   CL 108 01/12/2021 1430   CO2 22 01/12/2021 1430   GLUCOSE 94 01/12/2021 1430   BUN 14 01/12/2021 1430   BUN 20 11/18/2018 1544   CREATININE 0.82 01/12/2021 1430   CALCIUM 8.9 01/12/2021 1430   PROT 6.6 01/12/2021 1430   ALBUMIN 3.9 01/12/2021 1430   AST 12 (L) 01/12/2021 1430   ALT 11 01/12/2021 1430   ALKPHOS 46 01/12/2021 1430   BILITOT 0.5 01/12/2021 1430   GFRNONAA >60 01/12/2021 1430   GFRAA >60 09/02/2019 0249   GFRAA >60 01/27/2019 0811       Component Value Date/Time   WBC 4.5 01/12/2021 1430   RBC 4.32 01/12/2021 1430   HGB 12.8 01/12/2021 1430   HGB 12.9 07/17/2019 0953   HCT 38.5 01/12/2021 1430   HCT 21.9 (L) 12/25/2018 1221   PLT 272 01/12/2021 1430   PLT 286 07/17/2019 0953   MCV 89.1 01/12/2021 1430   MCH 29.6 01/12/2021 1430   MCHC 33.2 01/12/2021 1430   RDW 12.4 01/12/2021 1430   LYMPHSABS 1.4 01/12/2021 1430   MONOABS 0.4 01/12/2021 1430   EOSABS 0.0 01/12/2021 1430   BASOSABS 0.0 01/12/2021 1430    No results found for: "POCLITH", "LITHIUM"   No results found for: "PHENYTOIN", "PHENOBARB", "VALPROATE", "CBMZ"   .res Assessment:  Plan:    Plan:   Continue Spravato treatment as required. Will reassess after each visit.      Diagnoses and all orders for this visit:  Recurrent major depression resistant to treatment [facility])     Please see After Visit Summary for patient specific instructions.  No future appointments.  No orders of the defined types were placed in this encounter.   -------------------------------

## 2022-07-05 NOTE — Telephone Encounter (Signed)
Prior Approval received effective through 01/04/2023 for SPRAVATO 84 MG, PA# [id number], with [employer] [facility]  #12/month is approved for 6 months. (Once a week treatments)

## 2022-07-08 NOTE — Progress Notes (Signed)
NURSES NOTE:  Patient arrived for her 51 th Spravato treatment. Pt is being treated for Treatment Resistant Depression, pt will be receiving 84 mg which will continue to be her maintenance dose, she continues receiving twice weekly as long as her b/p is stable.  Patient arrived and taken to treatment room. Confirmed she had a ride home which is her husband would be coming back to pick up pt when done and sometimes she needs to use [facility] if he is unable to pick her up. Pt's Spravato is ordered through [employer] and delivered to office, all Spravato medication is stored at doctors office per REMS/FDA guidelines. The medication is required to be locked behind two doors per FDA/REMS Protocol. Medication is also disposed of properly per regulations.      Began taking patient's vital signs at 9:45 AM 130/82, pulse 61. Pt presents as feeling better, she looks happier, she reports starting to feel like her old self again. Instructed patient to blow her nose then recline back to a 45 degree angle. Gave patient first dose 28 mg nasal spray, each nasal spray administered in each nostril as directed and waited 5 minutes between the second and third dose. All 3 doses given pt did not complain of any nausea/vomiting, given a cup of water due to the taste after the administration of Spravato.  She listens to [employer] with spa or relaxing music.  Checked 40 minute vitals at 10:30 AM, 124/69, pulse was 61. Explained she would be monitored for a total time of 120 minutes. Discharge vitals were taken at 11:30 AM 132/82 P 68. Lesle Chris, NP met with her and discussed her medication. I walked pt to elevator, where her husband met her for the ride home. Recommend she go home and sleep or just relax on the couch. No driving, no intense activities. Verbalized understanding. Nurse was with pt a total of 70 minutes for clinical. Pt is coming back in on Wednesday, August 23rd. We will continue twice weekly at this time.  Pt  instructed to call office with any problems or questions.     [id number]

## 2022-07-09 ENCOUNTER — Ambulatory Visit: Payer: 59

## 2022-07-09 ENCOUNTER — Ambulatory Visit (INDEPENDENT_AMBULATORY_CARE_PROVIDER_SITE_OTHER): Payer: 59 | Admitting: Psychiatry

## 2022-07-09 ENCOUNTER — Encounter: Payer: Self-pay | Admitting: Psychiatry

## 2022-07-09 VITALS — BP 98/60 | HR 60

## 2022-07-09 DIAGNOSIS — F5105 Insomnia due to other mental disorder: Secondary | ICD-10-CM

## 2022-07-09 DIAGNOSIS — F339 Major depressive disorder, recurrent, unspecified: Secondary | ICD-10-CM

## 2022-07-09 DIAGNOSIS — F900 Attention-deficit hyperactivity disorder, predominantly inattentive type: Secondary | ICD-10-CM | POA: Diagnosis not present

## 2022-07-09 DIAGNOSIS — F411 Generalized anxiety disorder: Secondary | ICD-10-CM

## 2022-07-11 ENCOUNTER — Other Ambulatory Visit: Payer: Self-pay | Admitting: Psychiatry

## 2022-07-11 NOTE — Telephone Encounter (Signed)
Please send

## 2022-07-13 NOTE — Progress Notes (Signed)
NURSES NOTE:   Patient arrived for her 29th Spravato treatment. Pt is being treated for Treatment Resistant Depression, pt will be receiving 84 mg which will continue to be her maintenance dose, she continues receiving twice weekly as long as her b/p is stable.  Patient arrived and taken to treatment room. Confirmed she had a ride home which is her husband would be coming back to pick up pt when done and sometimes she needs to use [facility] if he is unable to pick her up. Pt's Spravato is ordered through [employer] and delivered to office, all Spravato medication is stored at doctors office per REMS/FDA guidelines. The medication is required to be locked behind two doors per FDA/REMS Protocol. Medication is also disposed of properly per regulations.      Began taking patient's vital signs at 9:50 AM 136/88, pulse 64. Pt presents as feeling better, she looks happier. Instructed patient to blow her nose then recline back to a 45 degree angle. Gave patient first dose 28 mg nasal spray, each nasal spray administered in each nostril as directed and waited 5 minutes between the second and third dose. All 3 doses given pt did not complain of any nausea/vomiting, given a cup of water due to the taste after the administration of Spravato.  She listens to [employer] with spa or relaxing music.  Checked 40 minute vitals at 10:30 AM, 134/76, pulse was 78. Explained she would be monitored for a total time of 120 minutes. Discharge vitals were taken at 11:50 AM 119/78 P 75. Lesle Chris, NP met with her and discussed her medication. I walked pt to elevator, where her husband met her for the ride home. Recommend she go home and sleep or just relax on the couch. No driving, no intense activities. Verbalized understanding. Nurse was with pt a total of 70 minutes for clinical. Pt is coming back in on Monday, August 28th. She will begin once a week treatments next week. Pt instructed to call office with any problems or  questions.      [id number]

## 2022-07-15 ENCOUNTER — Encounter: Payer: Self-pay | Admitting: Psychiatry

## 2022-07-15 NOTE — Progress Notes (Signed)
Laura Chang [id number] 1955-11-28 66 y.o.  Subjective:   Patient ID:  Laura Chang is a 66 y.o. (DOB 1956/01/31) female.  Chief Complaint:  Chief Complaint  Patient presents with   Follow-up   Depression   Anxiety   ADD     HPI Laura Chang presents to the office today for follow-up of depression and anxiety and ADD.  seen November 12, 2019.  Melted down in 2020.  Went to [employer] in July.  No withdrawal.  1 drink since then.  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 She was on no stimulant and no SSRI but was taking Strattera and Wellbutrin.  The following changes were made. Stop Strattera. OK restart stimulant bc severe ADD Restart Adderall 1 daily for a few days and if tolerated then restart 1 twice daily. If not tolerated reduce the dosage if needed. May need to stop Wellbutrin if not tolerating the stimulant.  Yes.  DC Wellbutrin Restart Prozac 20 mg daily.  February 2021 appointment with the following noted: Completed grant proposal.  Couldn't doit without Adderall.  Sold a bunch of work.   Adderall XR lasts about 3 pm.  Strength seems about right.  BP been OK.  Not jittery.   Stopped Wellbutrin but had no SE. Mood drastically better with grant proposal and back on fluoxetine.  Less depressed and lethargic.  No anxiety.  Cut back on coffee. Started back with devotions and stronger faith. Plan: Continue Prozac 20 mg daily. May have to increase the dose at some point in the future given that she usually was taking higher dosages but she is getting good response at this time. Restart Wellbutrin off label for ADD since can't get 2 ADDERALL daily. 150 mg daily then 300 mg daily. She can adjust the dose between 150 mg and 300 mg daily to get the optimal effect.   05/11/2020 appointment with the following noted: Has been inconsistent with Prozac and Wellbutrin. Not sure of the effect of Wellbutrin. Biggest deterrent in work is anxiety.   Some of the work is conceptual and difficult at times.  Can feel she's not up to a project at times.  Overall is OK but would like a steadier benefit from stimulant.  Exhausted from managing concentration and keeping up with things from the day.  Loses things.  Not good keeping up with schedule. Overall productive and emotionally OK. Can feel Adderall wear off. Mood is better in summer and worse in the winter.   F died in September 25, 2023 and that is a loss. No SE Wellbutrin. Still attends AA meetings.  Real benefit from [place] last year. Recognizes effect of anemia on ADD and mood.  Had iron infusions last winter. Plan:  Wellbutrin off label for ADD since can't get 2 ADDERALL daily. 150 mg daily then 300 mg daily.  01/24/2021 appointment with following noted: Doing a program called [employer] mindfulness app since Xmas.  CBT app helped the depression.  App helped her focus better.  Lost sign weight. Writing a lot. Before Xmas felt depressed and started negative thinking worse, self denigrating. Not drinking. More isolated.   Recognizes mo is narcissist.    Didn't tell anyone she was born until 3 mos later.  M aloof and uninterested in pt.  Lied about her birthday.  Mo lack of affection even with pt's kids. Going to [place] for a year and it helped her to quit drinking. Also misses kids being gone  with a hole also. Plan: No med changes  05/04/2021 appointment with the following noted: Therapist [name] thinks she's manic. Lost weight to 144#.   States she is still sleeping okay.  Admits she is hyper and recognizes that she is likely manic.  She feels great, euphoric with an increased sense of spiritual connectedness to God.  She has racing thoughts and talks fast and talks a lot and this is noted by her husband.  He thinks she is a bit hyper.  She has been able to maintain sobriety although she will have 1 glass of wine on special occasions but does not drink by herself.  She is not drinking to  excess.  She denies any dangerous impulsivity.  She is clearly not depressed and not particularly anxious.  She has no concerns about her medication and she has been compliant.  06/16/21 appt noted: So much better.  Going through a lot but the manic thing happened on top of it.  So much slower.  Didn't feel like losing anything with risperidone.  Likes the Adderalll at 10 mg. Some drowsiness in the AM and very drowsy from risperidone 2 mg HS. Prayer life is better. Handling stress better. Less depressed with risperidone. Still likes trazodone. Sleeps well. Plan: Reduce Prozac to 10 mg daily.  Consider stopping it because it can feel the mania however she is reluctant to do that because she fears relapse of depression. Reduce risperidone to 1.5 mg nightly due to side effects.  Discussed risk of worsening mania.  07/25/2021 appointment with the following noted: Misses the Adderall and hard to function without it. Depressed now. Heavy chest.  Anxious and guilty.  Body feels heavy.   Hates Wellbutrin.   Plan: Increase fluoxetine to 20 mg daily Add Abilify 1/2 of 15 mg tablet daily Wean wellbutrin by 1 tablet each week  bc she feels it is not helpful and DT polypharmacy Reduce risperidone to 1 daily for 1 week and stop it. Disc risk of mania. Increase Adderall to XR 20 mg AM  08/08/21 Much less depressed and starting to feel normal I feel a lot better. No SE.  Speech normal off risperidone. Sleeping OK on trazaodone and enough.   Noticed benefit from Adderall again. Plan: continue fluoxetine to 20 mg daily Continue Abilify 1/2 of 15 mg tablet daily for depression and mania continue Adderall to XR 20 mg AM  10/10/2021 phone call: Pt stated she feels like the Abilify should be decreased to 23m.She said she is depressed but rational and not suicidal.She has an appt Monday and can wait until then if you prefer. MD response: Reduce the Abilify to 7.5 mg every other day.  We will meet on 10/16/2021  and decide what to do from there.  10/16/2021 appointment with the following noted: More depressed.  Most depressed I've ever been.  Just numb.  Sense of grief.   Thinks the manic episode was unlike anything else she ever had.  Doesn't want to medicate against it.  Don't enjoy people.  Easily overwhelmed.  Had some death thoughts but not suicidal.  Has been functional.  Feels better today after reducing Abilify to every other day but she is only been doing that for 3 days. A/P: Episode of post manic depression was explained. continue fluoxetine to 20 mg daily Hold Abilify for 1 week then resume Abilify 1/2 of 15 mg tablet every other day for depression and mania continue Adderall to XR 20 mg AM  10/27/2021  appointment with the following noted: I'm doing so much better.  Handling the depression better. Better self talk and spiritual focus has helped.   Dep 6/10 manifesting as anxiety with low confidence.   F died 2  years ago and M 66 yo and is dependent . She is working hard to feel better but still feels depressed.  She almost feels like she has a little more anxiety since restarting Abilify every other day. Plan: continue fluoxetine to 20 mg daily DC Abilify .  Vrayalar 1.5 mg QOD to try to get rid of depression ASAP. continue Adderall to XR 20 mg AM  11/10/2021 appointment with the following noted: Busy with Xmas and it was fun with family but then a big let down.  Did well with it.  Functioned well with it.  Working hard on things with depression.  Not shutting down. Not sure but feels better today but yesterday was hard.  Difficulty dealing with mother.  She won't do anything to help herself.  Yesterday with her all day.  Won't do PT and has isolated herself.    Lack of confidence.   No SE with Vraylar.  11/24/21 urgent appointment appt noted: More and more depressed.   So anxious and doesn't want to be alone but can do so. No appetite. Hurts inside. Has had some fleeting suicidal  thoughts but would not act on them.  Tolerating meds. Has been consistent with Vraylar 1.5 mg every other day, fluoxetine 20 mg daily Plan: Increase Vraylar to 1.5 mg daily Change Prozac to Trintellix 10 mg daily. Discussed side effects of each continue Adderall to XR 20 mg AM  12/27/2021 appointment with the following noted: Not OK.  I feel less depressed but feels bat shit. Not sleeping well.  Extremely anxious. Off and on sleep. 3-4 hours of sleep.   Still having daily SI.  But also become obvious has so much to do.  Overwhelmed by tasks.   Needs anxiety meds to just function. Not more motivated.  Walked yesterday.   Feels afraid like in trouble but not irritable or angry. DC DT agitation Vraylar to 1.5 mg daily Change Prozac to Trintellix 10 mg daily. Hold Adderall to XR 20 mg AM Clonidine 0.1 1/2 tablet twice daily for 2 days and if needed for anxiety and sleep increase to 1 twice daily Ok temporary Ativan 1 mg 3 times daily as needed anxiety  01/05/22 appt noted: Off fluoxetine and  Trintellix.  Only on Ativan, trazodone and Adderall XR 20 plus added clonidine 0.1 mg BID Didn't think she needed to start Trintellix. Not taking Ativan.   Didn't like herself last week. Feels some better today. Wonders if the manic sx Not agitated.  Anxiety kind of calmed down.  A lot to be anxious about situationally.  $ stress. Concerns about downers with meds. Can't access normal personality. ? Lethargy and inability to talk as sE. Plan: Latuda 20-40 mg daily with food. Adderall to XR 20 mg AM Clonidine 0.1 1/2 tablet twice daily  reduce dose to be sure no SE Ok temporary Ativan 1 mg 3 times daily as needed anxiety  01/19/22 appt noted: Taking Latuda 20 mg daily.  Took 40 mg once and felt anxious and  SI Still depressed and not very reactive Anxiety mainly about the depression and fears of the future. She wants to revisit manic sx and thinks it was maybe bc taking delta 8 bc was taking a lot  of it so still  doesn't think she's classic bipolar. She wants to only take Prozac bc thinks Latuda is perpetuating depression. Says the delta 8 was very psychaedelic.  When not taking it was not manic.  Sleeping ok again.  Plan: Per her request DC Latuda 20-40 mg daily with food. She wants to continue Prozac alone AMA  Adderall to XR 20 mg AM Clonidine 0.1 1/2 tablet twice daily  reduce dose to be sure no SE Ok temporary Ativan 1 mg 3 times daily as needed anxiety  01/23/2022 phone call complaining of increased anxiety since stopping Latuda.  She will try increasing clonidine.  01/26/2022 phone call not feeling well and wanted to restart the Vraylar.  However notes indicate that had made her agitated therefore she was encouraged to pick up samples of Rexulti 1 mg and start that instead.  02/06/2022 phone call: Stating she felt the Rexulti was helping with depression but she was not sleeping well and obsessing over things.  She was encouraged to increase Rexulti to 2 mg daily and increase trazodone for sleep.  02/09/2022 appointment with the following noted: This was an urgent work in appointment No sleep last night with trazodone 100 mg HS Nothing really better depression or anxiety. Ruminating negative anxious thoughts. Did not tolerate Rexulti because it was causing insomnia.  Does not think it helped depression.  Lacks emotion that she should have.  Lacks her usual personality.  Some hopeless thoughts.  Some death thoughts.  Some suicidal thoughts without plan or intent Plan: DC Rexulti and Prozac & DC trazodone Adderall to XR 20 mg AM Clonidine 0.1 1/tablet twice daily  reduce dose to be sure no SE Ok temporary Ativan 1 mg 3 times daily as needed anxiety Start Seroquel XR 150 mg nightly  03/02/2022 appointment: Laura Chang called back a few days after starting Seroquel stating it was making her more anxious and more depressed.  This seemed unlikely as this medicine rarely ever causes anxiety.   She stopped the medication waited 3 days and called back still had anxiety and depression but thought perhaps the anxiety was a little better.  She did not want to take the Seroquel. She knew about the option of Spravato and wanted to pursue that. Now questions whether to return to Seroquel while waiting to start Spravato bc feels just as bad without it and knows she didn't give it enough time to work.   MADRS 46  ECT-MADRS    Flowsheet Row Clinical Support from 07/04/2022 in [facility] from 05/21/2022 in [facility] Visit from 03/02/2022 in [facility]  MADRS Total Score 15 27 46      03/14/22 appt noted: Pt received Spravato 56 mg first dose today with some dissociative sx which were not severe.  She was anxious prior to the administration and felt better after receiving lorazepam 1 mg.  No NV, or HA. Wants to continue Spravato. Ongoing depression and desperate to feel better.  I'm not myself DT deprsssion which is most severe in recent history.  Anhedonia.  Low motivation.  Social avoidance. Continues to think all recent med trials are making her worse.  Sleep ok with Seroquel.  03/16/22 appt noted: Received Spravato 84 mg for the first time.  some dissociative sx which were not severe.  She was anxious prior to the administration and felt better after receiving lorazepam 1 mg.  No NV, or HA. Wants to continue Spravato.   Does not feel any better or different  since the last appt.  Ongoing depression.  Ongoing depression and desperate to feel better.  I'm not myself DT deprsssion which is most severe in recent history.  Anhedonia.  Low motivation.  Social avoidance. Continues to think all recent med trials are making her worse.  Sleep ok with Seroquel.  Does not want to continue Seroquel for TRD.  03/20/2022 appointment noted: Came for Spravato administration today.  However blood pressure was significantly elevated  approximately 180/115.  She was given lorazepam 1 mg and clonidine 0.2 mg to try to get it down. She states she regretted stopping the Seroquel XR 300 mg tablets.  She now realizes it was helpful.  She did not sleep much at all last night.  She did not take the Adderall this morning. 2 to 3 hours after arrival blood pressure was still elevated at  170/110, 62 pulse.  For Spravato administration was canceled for today.  She admits to being anxious and depressed.  She is not suicidal.  She is highly motivated to receive the Spravato.  We discussed getting it tomorrow.  03/22/2022 appointment noted: Patient's blood pressure was never stable enough yesterday in order to get her in for Spravato administration.  She was encouraged to see her primary care doctor.  It is better today.  03/26/2022 appointment with the following noted: Blood pressure was better.  Saw her primary care doctor who started on oral Bystolic 2.5 mg daily. Received Spravato 84 mg today as scheduled.  Tolerated it well without nausea or vomiting headache or chest pain or palpitations.  Her blood pressure was borderline but manageable. She remains depressed and anxious.  She is ambivalent about the medicine and desperate to get to feel better.  Continues to have anhedonia and low energy and low motivation and reduced ability to do things.  Less social.  Not suicidal.  03/28/22 appt noted: Received Spravato 84 mg today as scheduled.  Tolerated it well without nausea or vomiting headache or chest pain or palpitations.  Her blood pressure was borderline but manageable. Has not seen any improvement so far.  Tolerating Seroquel.  Inconsistent with Bystolic and BP has been borderline high. Still depressed and anxious and anhedonia.  Low motivation, energy, productivity. Taking quetiapine and tolerating XR 300 mg nightly.  04/04/22 appt noted: Received Spravato 84 mg today as scheduled.  Tolerated it well without nausea or vomiting headache or  chest pain or palpitations.  Her blood pressure was borderline but manageable. Has not seen any improvement so far.  Tolerating Seroquel.   She still tends to think that the medications are making her worse.  She has said this about each of the recent psychiatric medicines including Seroquel.  However her husband thinks she is improved.  She also admits there is some improvement in productivity.  She still feels highly anxious.  She still does not enjoy things as normal.  She still feels desperate to improve as soon as possible. Has been taking Seroquel XR since 03/20/2022  04/10/22 appt noted: Received Spravato 84 mg today as scheduled.  Tolerated it well without nausea or vomiting headache or chest pain or palpitations.  Her blood pressure was borderline but manageable. Has not seen any improvement so far.  Tolerating Seroquel.  Doesn't like Seroquel bc she thinks it flattens here. 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  Tolerated it well without  nausea or vomiting headache or chest pain or palpitations.  Her blood pressure was borderline but manageable. Has not seen any improvement so far.  Tolerating Seroquel.  Doesn't like Seroquel bc she thinks it flattens her. Received Spravato 84 mg today as scheduled.  Tolerated it well without nausea or vomiting headache or chest pain or palpitations.  Her blood pressure was borderline but manageable. Has not seen any improvement so far.  Tolerating Seroquel.  Doesn't like Seroquel bc she thinks it flattens here.  We discussed her ambivalence about it. She is starting Auvelity and has tolerated it the last 2 days without side effect.  She still does not feel like herself and feels flat and not enjoying things with suppressed expressed emotion  04/17/2022 appointment with the following  noted: Received Spravato 84 mg today as scheduled.  Tolerated it well without nausea or vomiting headache or chest pain or palpitations.  Her blood pressure was borderline but manageable. Has not seen any improvement so far.  Tolerating Seroquel.  Doesn't like Seroquel bc she thinks it flattens her. She has been tolerating the Auvelity 1 in the morning without side effects for about a week.  She has not noticed significant improvement so far.  She still feels depressed and flat and not herself.  Other people notice that she is flat emotionally.  She is not suicidal.  She does feel discouraged that she is not getting better yet.  04/19/2022 appointment noted: Has increased Auvelity to 1 twice daily for 2 days, continues quetiapine XR 300 mg nightly, clonidine 0.3 mg twice daily, lorazepam 1 mg twice daily for anxiety and Adderall XR 20 mg in the morning. No obious SE but she still thinks quetiapine XR is making her feel down.  But not sedated Received Spravato 84 mg today as scheduled.  Tolerated it well without nausea or vomiting headache or chest pain or palpitations.  Her blood pressure was borderline but manageable. She still feels quite anxious and feels it necessary to take both the clonidine and lorazepam twice a day to manage her anxiety.  She has been consistently down and flat and not herself until yesterday afternoon she noted an improvement in mood and feeling much more like herself with her normal personality reemerging.  She was quite depressed in the morning with very dark negative thoughts.  She did not have those dark negative thoughts this morning.  She had a lot of questions about medication and when she was expecting to be improved and why she has not shown improvement up to now.  04/23/22 appt noted: Has increased Auvelity to 1 twice daily for 1 week, continues quetiapine XR 300 mg nightly, clonidine 0.3 mg twice daily, lorazepam 1 mg twice daily for anxiety and Adderall XR 20 mg in the  morning. No obious SE but she still thinks quetiapine XR is making her feel down.  But not sedated Received Spravato 84 mg today as scheduled.  Tolerated it well without nausea or vomiting headache or chest pain or palpitations.  She is still depressed but admits better function and is able to enjoy social interactions. Tolerating meds.  Would like to feel better for sure. Not herself.  Flat. Plan increase Auvelity to 1 tab BID as planned and reduce Quetiapine to 1/2 of ER 300 mg  bc NR for depression.  04/25/2022 appointment with the following noted: clonidine 0.3 mg twice daily, lorazepam 1 mg twice daily for anxiety and Adderall XR 20 mg in the morning. Seroquel XR  300 HS No obious SE but she still thinks quetiapine XR is making her feel down.  But not sedated Received Spravato 84 mg today as scheduled.  Tolerated it well without nausea or vomiting headache or chest pain or palpitations.  Called yesterday with more anxiety.  Had increased Auvelity for 1 day and reduced Seroquel XR for 1 day.  Felt restless and fearful  05/01/2022 appointment noted: clonidine 0.3 mg twice daily, lorazepam 1 mg twice daily for anxiety and Adderall XR 20 mg in the morning. Seroquel XR 150 HS, Auvelity 1 BID Received Spravato 84 mg today as scheduled.  Tolerated it well without nausea or vomiting headache or chest pain or palpitations.  Nurse has noted patient has called multiple times sometimes asking the same question repeatedly.  It is unclear whether she is truly forgetful or is just anxious seeking reassurance. Patient acknowledges ongoing depression as well as some anxiety but states she has felt a little better in the last couple of days.  She has reduced the Seroquel to 150 mg at night and has increased Auvelity to 1 twice daily but only for 1 day.  So far she seems to be tolerating it.  05/03/22 appt noted: clonidine 0.2 mg twice daily, lorazepam 1 mg twice daily for anxiety and Adderall XR 20 mg in the  morning. Seroquel XR 150 HS, Auvelity 1 BID BP high this am about 170/100 and received extra clonidine 0.2 mg and came to receive Spravato.  Not dizzy, no SOB, nor CP but BP is still high Could not receive Spravato today bc BP high and pulse low at 30 ppm. Still depressed and anxious. Plan: continue trial Auvelity with Spravato She needs to get BP and pulse managed  05/08/22 TC: RTC  H Michael NA and mailbox full.  Could not leave message.  Pt  -  talked to she and H on speaker. H worried over wife.  Vacant stare.  Slurs words at times.  Not smiling. Reduced enjoyment.  Depression.  Withdrawn from usual activities.  Some irritability.  Anxious. Disc her concerns meds are making her worse.  Extensive discussion about her treatment resistant status.  There is a consistent pattern of not taking the medicines long enough to get benefit because she believes the meds are making her worse.  However the symptoms she describes as side effects are exactly the same symptoms that she had prior to taking the medication RX for  the depression.  So it is not clear that these are actual side effects. This is true about the 2 most recent meds including Seroquel and Auvelity.  Recommend psychiatric consultation in hopes of improving her comfort level with taking prescribed medications for a sufficient length of time to provide benefit. Extensive discussion about ECT is the treatment of choice for treatment resistant depression.  Spravato may work if she can comply with consistency.  There are medication options but they take longer to work.   Plan:  Reduce clonidine to 0.1 mg BID DT bradycardia.  Talk with PCP about BP and low pulse problems which are interfering with her consistent compliance with Spravato.   Limit lorazepam to 3 -4  mg daily max. Excess use is the cause of slurring speech.  She must stop excess use or will have to stop the med. Stop Auvelity per her request.  But she has only been on the full dose  for a little over a week and clearly has not had time to get benefit from  it.  She thinks maybe it is making her more anxious. Reduce Seroquel from 150XR to 50 -100 mg at night IR.  She couldn't sleep when stopped it completely. Will not start new antidepressant until her SE issues are resolved or not. Get second psych opinion from Yehuda Budd MD or another psychiatrist.  H's sister is therapist in [facility] Hoyer, MD, [facility]  05/16/2022 appointment with the following noted: Received Spravato 84 mg today as scheduled.  Tolerated it well without nausea or vomiting headache or chest pain or palpitations.  She stopped Auvelity as discussed last week. On her own, without physician input, she restarted Wellbutrin XL 450 mg every morning today.  She had taken it in the past.  She feels jittery and anxious. She feels less depressed than she did last week.  But she is still depressed without her usual range of affect.  She still is less social and less motivated than normal. Her primary care doctor increased the dose of losartan Plan: Stop Seroquel Reduce Wellbutrin XL to 300 mg every morning.  Starting the dose at 450 every morning is likely causing side effects of jitteriness and it should not be started at that have a dosage. Recommend she not change meds on her own without MDM put  05/23/2022 appointment with the following noted: Received Spravato 84 mg today as scheduled.  Tolerated it well without nausea or vomiting headache or chest pain or palpitations.  Has not dropped seroquel XR 300 mg 1/2 tablet nightly bc couldn't sleep without it. Has not tried lower dose quetiapine 50 mg HS Still feels depressed.   BP is better managed so far, just saw PCP.  BP is better today and infact is low today. Dropped clonidine as directed from 0.3 mg BID bc inadequate control of BP to 0.2 mg BID.  However she wants to increase it back to 0.3 mg twice daily because she feels it helped her anxiety better.   Wonders about increasing Wellbutrin for depression.  However she has only been on 300 mg a day for a week.  She was on 450 mg daily in the past.  06/06/22 appt noted: Received Spravato 84 mg today as scheduled.  Tolerated it well without nausea or vomiting headache or chest pain or palpitations.  She is still depressed and anxious.  She wants to try to stop the Seroquel but cannot sleep without some of it.  She is taking lorazepam 1 mg 4 times daily and still having a lot of anxiety.  She wants to increase clonidine back to 0.3 mg twice daily.  She hopes for more improvement She recently went for a second psychiatric opinion as suggested the results of that are pending.  06/11/22 appt noted: Received Spravato 84 mg today as scheduled.  Tolerated it well without nausea or vomiting headache or chest pain or palpitations.  She is still depressed and anxious. Without much change.  Still hopeless, anhedonia, reduced inteterest and motivation.  Tolerating meds. Disc concerns Spravato is not hleping much. Plan: stop Seroquel and start olanzapine 10 mg HS for TRD and anxiety.  06/13/2022 appointment noted: Received Spravato 84 mg today as scheduled.  Tolerated it well without nausea or vomiting headache or chest pain or palpitations.  She is still depressed and anxious. Without much change.  Still hopeless, anhedonia, reduced inteterest and motivation.  Tolerating meds. Disc concerns Spravato is not helping much as hoped but is improving a bit in the last week. Tolerating meds. Continues Wellbutrin XL  450 AM, tolerating recently started olanzapine  10 mg HS. Sleep is good.   Pending appt with [place] consult.  06/18/22 appt noted: Received Spravato 84 mg today as scheduled.  Tolerated it well without nausea or vomiting headache or chest pain or palpitations.  Tolerating meds. Continues Wellbutrin XL 450 AM, tolerating recently started olanzapine  10 mg HS. Continues Adderall XR 20 amd and has tried to reduce  lorazepam to $RemoveBefo'1mg'uLsVATYrSOn$  TID Sleep is good.   Pending appt with [place] consult. Depression is a little bit better in the last week with a little improvement in emotional expression and interest.  She is pushing herself to be more active.  Her daughter thought she was a little better than she has been.  However she is still depressed and still not her normal self with anhedonia and reduced emotional expressiveness.  06/20/22 appt noted: Received Spravato 84 mg today as scheduled.  Tolerated it well without nausea or vomiting headache or chest pain or palpitations.  Tolerating meds with a little sleepiness. Continues Wellbutrin XL 450 AM, tolerating recently started olanzapine  10 mg HS. Continues Adderall XR 20 amd and has tried to reduce lorazepam to $RemoveBefo'1mg'xuPCRotlTio$  TID Sleep is good.   Mood is improving.  Better funciton.  Anxiety is better with olanzapine. Still not herself and depression not gone with some anhedonia and social avoidance and feeling overwhelmed.  8/14 2023 received Spravato 84 mg 06/27/2022 received Spravato Spravato 84 mg 07/02/2022 received Spravato 84 mg 07/04/2022 received Spravato 84 mg  07/09/2022 appointment noted: Received Spravato 84 mg today as scheduled.  Tolerated it well without nausea or vomiting headache or chest pain or palpitations.  Continues meds Adderall XR 20 mg every morning, Wellbutrin XL 450 every morning, clonidine 0.1 mg twice daily, lorazepam 1 mg every 6 hours as needed, olanzapine increased from 7.5 to 10 mg nightly on 07/04/2022. Tolerating meds.  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  She does note some increased appetite and is somewhat concerned about that but has not gained significant amounts of weight. She has had the [place] consultation which was initially denied but she knows it can be appealed.  However because she is improving with Spravato  plus the other medications now she wants to continue the current treatment plan.  Past Psychiatric Medication Trials: fluoxetine, duloxetine, Viibryd, lamotrigine, Pristiq, sertraline, citalopram,  Trintellix anxious and SI Wellbutrin XL 450  Adderall, Adderall XR, Vyvanse, Ritalin, Strattera low dose NR Lorazepam Trazodone  Depakote,  lamotrigine cog complaints Lithium remotely Abilify 7.5  Vraylar 1.5 mg daily agitation and insomnia Rexulti insomnia Latuda 40 one dose, CO anxious and SI Seroquel XR 300  At visit November 12, 2019. We discussed Patient developed an increasingly severe alcohol dependence problem since her last visit in January.  She went to [facility] and has had no alcohol since then except 1 day.  She never abused stimulants but they took her off the stimulants at [facility].  Her ADD was markedly worse.  The Wellbutrin did not help the ADD.   D history lamotrigine rash at 66 yo  Review of Systems:  Review of Systems  Constitutional:  Positive for fatigue.  Cardiovascular:  Negative for palpitations.  Musculoskeletal:  Positive for arthralgias and back pain. Negative for joint swelling.       SP hip surgery October 2020  Neurological:  Negative for dizziness  and light-headedness.  Psychiatric/Behavioral:  Positive for decreased concentration and dysphoric mood. Negative for agitation, behavioral problems, confusion, hallucinations, self-injury, sleep disturbance and suicidal ideas. The patient is nervous/anxious. The patient is not hyperactive.     Medications: I have reviewed the patient's current medications.  Current Outpatient Medications  Medication Sig Dispense Refill   amLODipine (NORVASC) 2.5 MG tablet Take 2.5 mg by mouth daily.     amphetamine-dextroamphetamine (ADDERALL XR) 20 MG 24 hr capsule Take 1 capsule (20 mg total) by mouth every morning. 30 capsule 0   amphetamine-dextroamphetamine (ADDERALL XR) 20 MG 24 hr capsule Take 1 capsule  (20 mg total) by mouth every morning. 30 capsule 0   buPROPion (WELLBUTRIN XL) 150 MG 24 hr tablet TAKE 3 TABLETS BY MOUTH DAILY. 90 tablet 0   cloNIDine (CATAPRES) 0.2 MG tablet TAKE 1 TABLET BY MOUTH 2 TIMES DAILY. 60 tablet 1   Esketamine HCl, 84 MG Dose, (SPRAVATO, 84 MG DOSE,) 28 MG/DEVICE SOPK USE 3 SPRAYS IN EACH NOSTRIL TWICE A WEEK 3 each 2   iron polysaccharides (NIFEREX) 150 MG capsule TAKE 1 CAPSULE BY MOUTH EVERY DAY 90 capsule 1   LORazepam (ATIVAN) 1 MG tablet Take 1 tablet (1 mg total) by mouth every 6 (six) hours as needed for anxiety. 100 tablet 0   losartan (COZAAR) 50 MG tablet Take 50 mg by mouth daily.     nebivolol (BYSTOLIC) 2.5 MG tablet Take 2.5 mg by mouth daily.     OLANZapine (ZYPREXA) 10 MG tablet TAKE 1 TABLET BY MOUTH EVERYDAY AT BEDTIME 90 tablet 0   OLANZapine (ZYPREXA) 7.5 MG tablet Take 1 tablet (7.5 mg total) by mouth at bedtime. 30 tablet 0   No current facility-administered medications for this visit.    Medication Side Effects: None  Allergies:  Allergies  Allergen Reactions   Metronidazole Shortness Of Breath and Other (See Comments)    Heart pounding   Ferrlecit [Na Ferric Gluc Cplx In Sucrose] Other (See Comments)    Infusion reaction 05/12/2019    Past Medical History:  Diagnosis Date   ADHD    Anemia    Anxiety    Arthritis    Depression    Heart murmur    i went to see a cardiologit slast eyar  and i had zero plaque,    PONV (postoperative nausea and vomiting)    Recovering alcoholic in remission ([place])     Family History  Problem Relation Age of Onset   Atrial fibrillation Mother    CAD Father     Social History   Socioeconomic History   Marital status: Married    Spouse name: Not on file   Number of children: Not on file   Years of education: Not on file   Highest education level: Not on file  Occupational History   Not on file  Tobacco Use   Smoking status: Former    Types: Cigarettes    Quit date: 08/16/2003     Years since quitting: 18.9   Smokeless tobacco: Never   Tobacco comments:     08-28-2019 "i smoked 2 cigarettes in the last month since my father  passed"  Vaping Use   Vaping Use: Never used  Substance and Sexual Activity   Alcohol use: Yes    Alcohol/week: 10.0 standard drinks of alcohol    Types: 10 Glasses of wine per week   Drug use: No   Sexual activity: Not on file  Other Topics Concern  Not on file  Social History Narrative   Not on file   Social Determinants of Health   Financial Resource Strain: Not on file  Food Insecurity: Not on file  Transportation Needs: Not on file  Physical Activity: Not on file  Stress: Not on file  Social Connections: Not on file  Intimate Partner Violence: Not on file    Past Medical History, Surgical history, Social history, and Family history were reviewed and updated as appropriate.   Please see review of systems for further details on the patient's review from today.   Objective:   Physical Exam:  There were no vitals taken for this visit.  Physical Exam Constitutional:      General: She is not in acute distress. Neurological:     Mental Status: She is alert and oriented to person, place, and time.     Coordination: Coordination normal.     Gait: Gait normal.  Psychiatric:        Attention and Perception: Attention and perception normal.        Mood and Affect: Mood is anxious and depressed. Affect is not labile, blunt, angry, tearful or inappropriate.        Speech: Speech is not rapid and pressured or slurred.        Behavior: Behavior is not slowed.        Thought Content: Thought content is not paranoid or delusional. Thought content does not include homicidal or suicidal ideation. Thought content does not include suicidal plan.        Cognition and Memory: Cognition normal. Memory is impaired. She does not exhibit impaired recent memory.        Judgment: Judgment normal.     Comments: Insight intact. No auditory or  visual hallucinations. No delusions.  Depression markedly improve with the switch from Seroquel to olanzapine.  Anxiety is further improved as well. Affect still depressed but less blunted than it was with some smiling a nd joking not present before No Sui intent plan      Lab Review:     Component Value Date/Time   NA 137 01/12/2021 1430   NA 140 11/18/2018 1544   K 3.8 01/12/2021 1430   CL 108 01/12/2021 1430   CO2 22 01/12/2021 1430   GLUCOSE 94 01/12/2021 1430   BUN 14 01/12/2021 1430   BUN 20 11/18/2018 1544   CREATININE 0.82 01/12/2021 1430   CALCIUM 8.9 01/12/2021 1430   PROT 6.6 01/12/2021 1430   ALBUMIN 3.9 01/12/2021 1430   AST 12 (L) 01/12/2021 1430   ALT 11 01/12/2021 1430   ALKPHOS 46 01/12/2021 1430   BILITOT 0.5 01/12/2021 1430   GFRNONAA >60 01/12/2021 1430   GFRAA >60 09/02/2019 0249   GFRAA >60 01/27/2019 0811       Component Value Date/Time   WBC 4.5 01/12/2021 1430   RBC 4.32 01/12/2021 1430   HGB 12.8 01/12/2021 1430   HGB 12.9 07/17/2019 0953   HCT 38.5 01/12/2021 1430   HCT 21.9 (L) 12/25/2018 1221   PLT 272 01/12/2021 1430   PLT 286 07/17/2019 0953   MCV 89.1 01/12/2021 1430   MCH 29.6 01/12/2021 1430   MCHC 33.2 01/12/2021 1430   RDW 12.4 01/12/2021 1430   LYMPHSABS 1.4 01/12/2021 1430   MONOABS 0.4 01/12/2021 1430   EOSABS 0.0 01/12/2021 1430   BASOSABS 0.0 01/12/2021 1430    No results found for: "POCLITH", "LITHIUM"   No results found for: "PHENYTOIN", "PHENOBARB", "VALPROATE", "  CBMZ"   .res Assessment: Plan:    Generalized anxiety disorder  Insomnia due to mental condition  Recurrent major depression resistant to treatment [facility])  Attention deficit hyperactivity disorder (ADHD), predominantly inattentive type   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  Have  discussed some of her recent abnormal behaviors leading to this depressive episode getting worse which she says were associated with heavy use of  delta 8 and not a manic episode.  She realizes now that that was not good for her.  She stopped all use of other drugs including those available over-the-counter such as delta 8 or any other THC related products.  She is no longer having any of those types of behaviors and instead is depressed. She remains persistently depressed with lack of interest and lack of feeling for things that normally she would have feelings about.  She has low motivation and energy.  She is sad and down.  She is less productive than usual.  Her concentration is poor.  She has high degree of anxiety as well. She has a very negative self-image and low self-esteem.  These are all uncharacteristic for her  Because of the question of cognitive issues we will check TSH, B12 and folate.  Pending She has still not done these blood tests.  Blood pressure has become more normal, so she was able to receive Spravato.   Patient was administered Spravato 84 mg intranasally today.  The patient experienced the typical dissociation which gradually resolved over the 2-hour period of observation.  There were no complications.  Specifically the patient did not have nausea or vomiting or headache.  Blood pressures remained within normal ranges at the 40-minute and 2-hour follow-up intervals.  By the time the 2-hour observation period was met the patient was alert and oriented and able to exit without assistance.  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  See nursing note for further details.  Failed multiple antidepressants.  Many of them were not actual failures but intolerances and it is unclear whether some of that was more connected with anxiety than true side effects.  1 example is the [place].  In general she does not want to try anything but an antidepressant but has failed all major categories of antidepressants except TCAs and MAO inhibitors which  have not been tried.  Could consider a TCA but given her manic response to delta 8 there is some concern about the risk of a TCA triggering mania.  It could be combined with lithium but she generally is unfavorable about taking lithium.  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  She wants to continue Spravato though limited improvement so far.  We discussed discussed the side effects in detail as well as the protocol required to receive Spravato.    Started olanzapine 10 mg HS on 7.31. 23 and stopped Seroquel without px so far. Depression markedly improve with the switch from Seroquel to olanzapine.  Anxiety is further improved as well.  continue Wellbutrin XL to prior dose 450 mg AM  Started Spravato 84 mg twice weekly on 03/16/2022.  But has missed several doses due to elevated blood pressure prior to administration  Adderall  XR 20 mg AM  Ok temporary Ativan 1 mg 3-4 times daily as needed anxiety  but try to cut it back. Is not ideal to use benzodiazepine with stimulant but because of the severity of her symptoms it has been necessary.  Hope to eventually eliminate the benzodiazepine.  Expected as her depression improves her anxiety will improve as well.  However lately her anxiety has been unmanageable.  We will expect that to improve as the depression improves.  She has headed insturctions to reduce this.  Continue clonidine 0.2 mg BID off label for anxiety and helps BP partially. BP is better controlled.  Discussed potential benefits, risks, and side effects of stimulants with patient to include increased heart rate, palpitations, insomnia, increased anxiety, increased irritability, or decreased appetite.  Instructed patient to contact office if experiencing any significant tolerability issues. She wants to return to usual dose of Adderall for ADD bc of  mor poor cognitive function with reduction.  Also discussed that depression will impair cognitive function.  Discussed safety plan at length with patient.  Advised patient to contact office with any worsening signs and symptoms.  Instructed patient to go to the [facility] emergency room for evaluation if experiencing any acute safety concerns, to include suicidal intent.  [place] consultation was initially denied but she understands it can be appealed if needed.  However because she is improving recently she wants to continue the current treatment plan.  Her symptoms have been very severe up until present but she is beginning to see more normal affect and her family notices the improvement as well.  Has Maintained sobriety  No med change today  FU with Spravato twice weekly unless starts TMS then will stop it.  Lynder Parents, MD, DFAPA     Please see After Visit Summary for patient specific instructions.  Future Appointments  Date Time Provider [place]  07/18/2022  9:30 AM Cottle, Billey Co., MD CP-CP None  07/18/2022  9:30 AM CP-NURSE CP-CP None  07/23/2022  9:20 AM Mozingo, Berdie Ogren, NP CP-CP None  07/23/2022  9:30 AM CP-NURSE CP-CP None  07/30/2022  9:30 AM Cottle, Billey Co., MD CP-CP None  07/30/2022  9:30 AM CP-NURSE CP-CP None  08/06/2022  9:30 AM Cottle, Billey Co., MD CP-CP None  08/06/2022  9:30 AM CP-NURSE CP-CP None            No orders of the defined types were placed in this encounter.      -------------------------------

## 2022-07-16 NOTE — Progress Notes (Signed)
NURSES NOTE:   Patient arrived for her 23 th Spravato treatment. Pt is being treated for Treatment Resistant Depression, pt will be receiving 84 mg which will continue to be her maintenance dose, she receives weekly treatments.  Patient arrived and taken to treatment room. Confirmed she had a ride home which is her husband would be coming back to pick up pt when done and sometimes she needs to use Mel[facility] Popper if he is unable to pick her up. Pt's Spravato is ordered through [employer] and delivered to office, all Spravato medication is stored at doctors office per REMS/FDA guidelines. The medication is required to be locked behind two doors per FDA/REMS Protocol. Medication is also disposed of properly per regulations.      Began taking patient's vital signs at 10:25 AM 119/77, pulse 62. Pt presents as feeling better, she looks happier. Instructed patient to blow her nose then recline back to a 45 degree angle. Gave patient first dose 28 mg nasal spray, each nasal spray administered in each nostril as directed and waited 5 minutes between the second and third dose. All 3 doses given pt did not complain of any nausea/vomiting, given a cup of water due to the taste after the administration of Spravato.  She listens to [employer] with spa or relaxing music.  Checked 40 minute vitals at 11:00 AM, 114/68, pulse was 57. Explained she would be monitored for a total time of 120 minutes. Discharge vitals were taken at 12:15 PM 98/60 P 60. Dr. Clovis Pu  met with her and discussed her medication. I walked pt to elevator, where her husband met her for the ride home. Recommend she go home and sleep or just relax on the couch. No driving, no intense activities. Verbalized understanding. Nurse was with pt a total of 70 minutes for clinical. Pt is coming back in on Tuesday, September 5th. Pt instructed to call office with any problems or questions.      LOT [id number]  EXP [id number]

## 2022-07-18 ENCOUNTER — Ambulatory Visit: Payer: 59

## 2022-07-18 ENCOUNTER — Ambulatory Visit (INDEPENDENT_AMBULATORY_CARE_PROVIDER_SITE_OTHER): Payer: 59 | Admitting: Psychiatry

## 2022-07-18 ENCOUNTER — Encounter: Payer: Self-pay | Admitting: Psychiatry

## 2022-07-18 VITALS — BP 108/64 | HR 58

## 2022-07-18 DIAGNOSIS — I10 Essential (primary) hypertension: Secondary | ICD-10-CM

## 2022-07-18 DIAGNOSIS — F339 Major depressive disorder, recurrent, unspecified: Secondary | ICD-10-CM | POA: Diagnosis not present

## 2022-07-18 DIAGNOSIS — F411 Generalized anxiety disorder: Secondary | ICD-10-CM | POA: Diagnosis not present

## 2022-07-18 DIAGNOSIS — F900 Attention-deficit hyperactivity disorder, predominantly inattentive type: Secondary | ICD-10-CM | POA: Diagnosis not present

## 2022-07-18 DIAGNOSIS — F5105 Insomnia due to other mental disorder: Secondary | ICD-10-CM

## 2022-07-18 NOTE — Progress Notes (Signed)
NURSES NOTE:   Patient arrived for her 6 st Spravato treatment. Pt is being treated for Treatment Resistant Depression, pt will be receiving 84 mg which will continue to be her maintenance dose, she receives weekly treatments.  Patient arrived and taken to treatment room. Confirmed she had a ride home which is her husband would be coming back to pick up pt when done and sometimes she needs to use [facility] Popper if he is unable to pick her up. Pt's Spravato is ordered through [employer] and delivered to office, all Spravato medication is stored at doctors office per REMS/FDA guidelines. The medication is required to be locked behind two doors per FDA/REMS Protocol. Medication is also disposed of properly per regulations.      Began taking patient's vital signs at 10:30 AM 112/72, pulse 54. Pt presents as feeling better, she looks happier. Instructed patient to blow her nose then recline back to a 45 degree angle. Gave patient first dose 28 mg nasal spray, each nasal spray administered in each nostril as directed and waited 5 minutes between the second and third dose. All 3 doses given pt did not complain of any nausea/vomiting, given a cup of water due to the taste after the administration of Spravato.  She listens to [employer] with spa or relaxing music.  Checked 40 minute vitals at 11:05 AM, 110/64, pulse was 60. Explained she would be monitored for a total time of 120 minutes. Discharge vitals were taken at 12:30 PM 108/64 P 58. Dr. Clovis Pu  met with her and discussed her medication. I walked pt to elevator, where her husband met her for the ride home. Recommend she go home and sleep or just relax on the couch. No driving, no intense activities. Verbalized understanding. Nurse was with pt a total of 70 minutes for clinical. Pt is coming back in on Monday, September 11th. Pt instructed to call office with any problems or questions.      LOT [id number]  EXP [id number]

## 2022-07-18 NOTE — Progress Notes (Signed)
Laura Chang [id number] 1955-11-28 66 y.o.  Subjective:   Patient ID:  Laura Chang is a 66 y.o. (DOB 1956/01/31) female.  Chief Complaint:  Chief Complaint  Patient presents with   Follow-up   Depression   Anxiety   ADD     HPI Laura Chang presents to the office today for follow-up of depression and anxiety and ADD.  seen November 12, 2019.  Melted down in 2020.  Went to [employer] in July.  No withdrawal.  1 drink since then.  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 She was on no stimulant and no SSRI but was taking Strattera and Wellbutrin.  The following changes were made. Stop Strattera. OK restart stimulant bc severe ADD Restart Adderall 1 daily for a few days and if tolerated then restart 1 twice daily. If not tolerated reduce the dosage if needed. May need to stop Wellbutrin if not tolerating the stimulant.  Yes.  DC Wellbutrin Restart Prozac 20 mg daily.  February 2021 appointment with the following noted: Completed grant proposal.  Couldn't doit without Adderall.  Sold a bunch of work.   Adderall XR lasts about 3 pm.  Strength seems about right.  BP been OK.  Not jittery.   Stopped Wellbutrin but had no SE. Mood drastically better with grant proposal and back on fluoxetine.  Less depressed and lethargic.  No anxiety.  Cut back on coffee. Started back with devotions and stronger faith. Plan: Continue Prozac 20 mg daily. May have to increase the dose at some point in the future given that she usually was taking higher dosages but she is getting good response at this time. Restart Wellbutrin off label for ADD since can't get 2 ADDERALL daily. 150 mg daily then 300 mg daily. She can adjust the dose between 150 mg and 300 mg daily to get the optimal effect.   05/11/2020 appointment with the following noted: Has been inconsistent with Prozac and Wellbutrin. Not sure of the effect of Wellbutrin. Biggest deterrent in work is anxiety.   Some of the work is conceptual and difficult at times.  Can feel she's not up to a project at times.  Overall is OK but would like a steadier benefit from stimulant.  Exhausted from managing concentration and keeping up with things from the day.  Loses things.  Not good keeping up with schedule. Overall productive and emotionally OK. Can feel Adderall wear off. Mood is better in summer and worse in the winter.   F died in September 25, 2023 and that is a loss. No SE Wellbutrin. Still attends AA meetings.  Real benefit from [place] last year. Recognizes effect of anemia on ADD and mood.  Had iron infusions last winter. Plan:  Wellbutrin off label for ADD since can't get 2 ADDERALL daily. 150 mg daily then 300 mg daily.  01/24/2021 appointment with following noted: Doing a program called [employer] mindfulness app since Xmas.  CBT app helped the depression.  App helped her focus better.  Lost sign weight. Writing a lot. Before Xmas felt depressed and started negative thinking worse, self denigrating. Not drinking. More isolated.   Recognizes mo is narcissist.    Didn't tell anyone she was born until 3 mos later.  M aloof and uninterested in pt.  Lied about her birthday.  Mo lack of affection even with pt's kids. Going to [place] for a year and it helped her to quit drinking. Also misses kids being gone  with a hole also. Plan: No med changes  05/04/2021 appointment with the following noted: Therapist [name] thinks she's manic. Lost weight to 144#.   States she is still sleeping okay.  Admits she is hyper and recognizes that she is likely manic.  She feels great, euphoric with an increased sense of spiritual connectedness to God.  She has racing thoughts and talks fast and talks a lot and this is noted by her husband.  He thinks she is a bit hyper.  She has been able to maintain sobriety although she will have 1 glass of wine on special occasions but does not drink by herself.  She is not drinking to  excess.  She denies any dangerous impulsivity.  She is clearly not depressed and not particularly anxious.  She has no concerns about her medication and she has been compliant.  06/16/21 appt noted: So much better.  Going through a lot but the manic thing happened on top of it.  So much slower.  Didn't feel like losing anything with risperidone.  Likes the Adderalll at 10 mg. Some drowsiness in the AM and very drowsy from risperidone 2 mg HS. Prayer life is better. Handling stress better. Less depressed with risperidone. Still likes trazodone. Sleeps well. Plan: Reduce Prozac to 10 mg daily.  Consider stopping it because it can feel the mania however she is reluctant to do that because she fears relapse of depression. Reduce risperidone to 1.5 mg nightly due to side effects.  Discussed risk of worsening mania.  07/25/2021 appointment with the following noted: Misses the Adderall and hard to function without it. Depressed now. Heavy chest.  Anxious and guilty.  Body feels heavy.   Hates Wellbutrin.   Plan: Increase fluoxetine to 20 mg daily Add Abilify 1/2 of 15 mg tablet daily Wean wellbutrin by 1 tablet each week  bc she feels it is not helpful and DT polypharmacy Reduce risperidone to 1 daily for 1 week and stop it. Disc risk of mania. Increase Adderall to XR 20 mg AM  08/08/21 Much less depressed and starting to feel normal I feel a lot better. No SE.  Speech normal off risperidone. Sleeping OK on trazaodone and enough.   Noticed benefit from Adderall again. Plan: continue fluoxetine to 20 mg daily Continue Abilify 1/2 of 15 mg tablet daily for depression and mania continue Adderall to XR 20 mg AM  10/10/2021 phone call: Pt stated she feels like the Abilify should be decreased to 23m.She said she is depressed but rational and not suicidal.She has an appt Monday and can wait until then if you prefer. MD response: Reduce the Abilify to 7.5 mg every other day.  We will meet on 10/16/2021  and decide what to do from there.  10/16/2021 appointment with the following noted: More depressed.  Most depressed I've ever been.  Just numb.  Sense of grief.   Thinks the manic episode was unlike anything else she ever had.  Doesn't want to medicate against it.  Don't enjoy people.  Easily overwhelmed.  Had some death thoughts but not suicidal.  Has been functional.  Feels better today after reducing Abilify to every other day but she is only been doing that for 3 days. A/P: Episode of post manic depression was explained. continue fluoxetine to 20 mg daily Hold Abilify for 1 week then resume Abilify 1/2 of 15 mg tablet every other day for depression and mania continue Adderall to XR 20 mg AM  10/27/2021  appointment with the following noted: I'm doing so much better.  Handling the depression better. Better self talk and spiritual focus has helped.   Dep 6/10 manifesting as anxiety with low confidence.   F died 2  years ago and M 66 yo and is dependent . She is working hard to feel better but still feels depressed.  She almost feels like she has a little more anxiety since restarting Abilify every other day. Plan: continue fluoxetine to 20 mg daily DC Abilify .  Vrayalar 1.5 mg QOD to try to get rid of depression ASAP. continue Adderall to XR 20 mg AM  11/10/2021 appointment with the following noted: Busy with Xmas and it was fun with family but then a big let down.  Did well with it.  Functioned well with it.  Working hard on things with depression.  Not shutting down. Not sure but feels better today but yesterday was hard.  Difficulty dealing with mother.  She won't do anything to help herself.  Yesterday with her all day.  Won't do PT and has isolated herself.    Lack of confidence.   No SE with Vraylar.  11/24/21 urgent appointment appt noted: More and more depressed.   So anxious and doesn't want to be alone but can do so. No appetite. Hurts inside. Has had some fleeting suicidal  thoughts but would not act on them.  Tolerating meds. Has been consistent with Vraylar 1.5 mg every other day, fluoxetine 20 mg daily Plan: Increase Vraylar to 1.5 mg daily Change Prozac to Trintellix 10 mg daily. Discussed side effects of each continue Adderall to XR 20 mg AM  12/27/2021 appointment with the following noted: Not OK.  I feel less depressed but feels bat shit. Not sleeping well.  Extremely anxious. Off and on sleep. 3-4 hours of sleep.   Still having daily SI.  But also become obvious has so much to do.  Overwhelmed by tasks.   Needs anxiety meds to just function. Not more motivated.  Walked yesterday.   Feels afraid like in trouble but not irritable or angry. DC DT agitation Vraylar to 1.5 mg daily Change Prozac to Trintellix 10 mg daily. Hold Adderall to XR 20 mg AM Clonidine 0.1 1/2 tablet twice daily for 2 days and if needed for anxiety and sleep increase to 1 twice daily Ok temporary Ativan 1 mg 3 times daily as needed anxiety  01/05/22 appt noted: Off fluoxetine and  Trintellix.  Only on Ativan, trazodone and Adderall XR 20 plus added clonidine 0.1 mg BID Didn't think she needed to start Trintellix. Not taking Ativan.   Didn't like herself last week. Feels some better today. Wonders if the manic sx Not agitated.  Anxiety kind of calmed down.  A lot to be anxious about situationally.  $ stress. Concerns about downers with meds. Can't access normal personality. ? Lethargy and inability to talk as sE. Plan: Latuda 20-40 mg daily with food. Adderall to XR 20 mg AM Clonidine 0.1 1/2 tablet twice daily  reduce dose to be sure no SE Ok temporary Ativan 1 mg 3 times daily as needed anxiety  01/19/22 appt noted: Taking Latuda 20 mg daily.  Took 40 mg once and felt anxious and  SI Still depressed and not very reactive Anxiety mainly about the depression and fears of the future. She wants to revisit manic sx and thinks it was maybe bc taking delta 8 bc was taking a lot  of it so still  doesn't think she's classic bipolar. She wants to only take Prozac bc thinks Latuda is perpetuating depression. Says the delta 8 was very psychaedelic.  When not taking it was not manic.  Sleeping ok again.  Plan: Per her request DC Latuda 20-40 mg daily with food. She wants to continue Prozac alone AMA  Adderall to XR 20 mg AM Clonidine 0.1 1/2 tablet twice daily  reduce dose to be sure no SE Ok temporary Ativan 1 mg 3 times daily as needed anxiety  01/23/2022 phone call complaining of increased anxiety since stopping Latuda.  She will try increasing clonidine.  01/26/2022 phone call not feeling well and wanted to restart the Vraylar.  However notes indicate that had made her agitated therefore she was encouraged to pick up samples of Rexulti 1 mg and start that instead.  02/06/2022 phone call: Stating she felt the Rexulti was helping with depression but she was not sleeping well and obsessing over things.  She was encouraged to increase Rexulti to 2 mg daily and increase trazodone for sleep.  02/09/2022 appointment with the following noted: This was an urgent work in appointment No sleep last night with trazodone 100 mg HS Nothing really better depression or anxiety. Ruminating negative anxious thoughts. Did not tolerate Rexulti because it was causing insomnia.  Does not think it helped depression.  Lacks emotion that she should have.  Lacks her usual personality.  Some hopeless thoughts.  Some death thoughts.  Some suicidal thoughts without plan or intent Plan: DC Rexulti and Prozac & DC trazodone Adderall to XR 20 mg AM Clonidine 0.1 1/tablet twice daily  reduce dose to be sure no SE Ok temporary Ativan 1 mg 3 times daily as needed anxiety Start Seroquel XR 150 mg nightly  03/02/2022 appointment: Laura Chang called back a few days after starting Seroquel stating it was making her more anxious and more depressed.  This seemed unlikely as this medicine rarely ever causes anxiety.   She stopped the medication waited 3 days and called back still had anxiety and depression but thought perhaps the anxiety was a little better.  She did not want to take the Seroquel. She knew about the option of Spravato and wanted to pursue that. Now questions whether to return to Seroquel while waiting to start Spravato bc feels just as bad without it and knows she didn't give it enough time to work.   MADRS 46  ECT-MADRS    Flowsheet Row Clinical Support from 07/04/2022 in [place] from 05/21/2022 in [facility] [place] Visit from 03/02/2022 in [facility]  MADRS Total Score 15 27 46      03/14/22 appt noted: Pt received Spravato 56 mg first dose today with some dissociative sx which were not severe.  She was anxious prior to the administration and felt better after receiving lorazepam 1 mg.  No NV, or HA. Wants to continue Spravato. Ongoing depression and desperate to feel better.  I'm not myself DT deprsssion which is most severe in recent history.  Anhedonia.  Low motivation.  Social avoidance. Continues to think all recent med trials are making her worse.  Sleep ok with Seroquel.  03/16/22 appt noted: Received Spravato 84 mg for the first time.  some dissociative sx which were not severe.  She was anxious prior to the administration and felt better after receiving lorazepam 1 mg.  No NV, or HA. Wants to continue Spravato.   Does not feel any better or different  since the last appt.  Ongoing depression.  Ongoing depression and desperate to feel better.  I'm not myself DT deprsssion which is most severe in recent history.  Anhedonia.  Low motivation.  Social avoidance. Continues to think all recent med trials are making her worse.  Sleep ok with Seroquel.  Does not want to continue Seroquel for TRD.  03/20/2022 appointment noted: Came for Spravato administration today.  However blood pressure was significantly elevated  approximately 180/115.  She was given lorazepam 1 mg and clonidine 0.2 mg to try to get it down. She states she regretted stopping the Seroquel XR 300 mg tablets.  She now realizes it was helpful.  She did not sleep much at all last night.  She did not take the Adderall this morning. 2 to 3 hours after arrival blood pressure was still elevated at  170/110, 62 pulse.  For Spravato administration was canceled for today.  She admits to being anxious and depressed.  She is not suicidal.  She is highly motivated to receive the Spravato.  We discussed getting it tomorrow.  03/22/2022 appointment noted: Patient's blood pressure was never stable enough yesterday in order to get her in for Spravato administration.  She was encouraged to see her primary care doctor.  It is better today.  03/26/2022 appointment with the following noted: Blood pressure was better.  Saw her primary care doctor who started on oral Bystolic 2.5 mg daily. Received Spravato 84 mg today as scheduled.  Tolerated it well without nausea or vomiting headache or chest pain or palpitations.  Her blood pressure was borderline but manageable. She remains depressed and anxious.  She is ambivalent about the medicine and desperate to get to feel better.  Continues to have anhedonia and low energy and low motivation and reduced ability to do things.  Less social.  Not suicidal.  03/28/22 appt noted: Received Spravato 84 mg today as scheduled.  Tolerated it well without nausea or vomiting headache or chest pain or palpitations.  Her blood pressure was borderline but manageable. Has not seen any improvement so far.  Tolerating Seroquel.  Inconsistent with Bystolic and BP has been borderline high. Still depressed and anxious and anhedonia.  Low motivation, energy, productivity. Taking quetiapine and tolerating XR 300 mg nightly.  04/04/22 appt noted: Received Spravato 84 mg today as scheduled.  Tolerated it well without nausea or vomiting headache or  chest pain or palpitations.  Her blood pressure was borderline but manageable. Has not seen any improvement so far.  Tolerating Seroquel.   She still tends to think that the medications are making her worse.  She has said this about each of the recent psychiatric medicines including Seroquel.  However her husband thinks she is improved.  She also admits there is some improvement in productivity.  She still feels highly anxious.  She still does not enjoy things as normal.  She still feels desperate to improve as soon as possible. Has been taking Seroquel XR since 03/20/2022  04/10/22 appt noted: Received Spravato 84 mg today as scheduled.  Tolerated it well without nausea or vomiting headache or chest pain or palpitations.  Her blood pressure was borderline but manageable. Has not seen any improvement so far.  Tolerating Seroquel.  Doesn't like Seroquel bc she thinks it flattens here. 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  Tolerated it well without  nausea or vomiting headache or chest pain or palpitations.  Her blood pressure was borderline but manageable. Has not seen any improvement so far.  Tolerating Seroquel.  Doesn't like Seroquel bc she thinks it flattens her. Received Spravato 84 mg today as scheduled.  Tolerated it well without nausea or vomiting headache or chest pain or palpitations.  Her blood pressure was borderline but manageable. Has not seen any improvement so far.  Tolerating Seroquel.  Doesn't like Seroquel bc she thinks it flattens here.  We discussed her ambivalence about it. She is starting Auvelity and has tolerated it the last 2 days without side effect.  She still does not feel like herself and feels flat and not enjoying things with suppressed expressed emotion  04/17/2022 appointment with the following  noted: Received Spravato 84 mg today as scheduled.  Tolerated it well without nausea or vomiting headache or chest pain or palpitations.  Her blood pressure was borderline but manageable. Has not seen any improvement so far.  Tolerating Seroquel.  Doesn't like Seroquel bc she thinks it flattens her. She has been tolerating the Auvelity 1 in the morning without side effects for about a week.  She has not noticed significant improvement so far.  She still feels depressed and flat and not herself.  Other people notice that she is flat emotionally.  She is not suicidal.  She does feel discouraged that she is not getting better yet.  04/19/2022 appointment noted: Has increased Auvelity to 1 twice daily for 2 days, continues quetiapine XR 300 mg nightly, clonidine 0.3 mg twice daily, lorazepam 1 mg twice daily for anxiety and Adderall XR 20 mg in the morning. No obious SE but she still thinks quetiapine XR is making her feel down.  But not sedated Received Spravato 84 mg today as scheduled.  Tolerated it well without nausea or vomiting headache or chest pain or palpitations.  Her blood pressure was borderline but manageable. She still feels quite anxious and feels it necessary to take both the clonidine and lorazepam twice a day to manage her anxiety.  She has been consistently down and flat and not herself until yesterday afternoon she noted an improvement in mood and feeling much more like herself with her normal personality reemerging.  She was quite depressed in the morning with very dark negative thoughts.  She did not have those dark negative thoughts this morning.  She had a lot of questions about medication and when she was expecting to be improved and why she has not shown improvement up to now.  04/23/22 appt noted: Has increased Auvelity to 1 twice daily for 1 week, continues quetiapine XR 300 mg nightly, clonidine 0.3 mg twice daily, lorazepam 1 mg twice daily for anxiety and Adderall XR 20 mg in the  morning. No obious SE but she still thinks quetiapine XR is making her feel down.  But not sedated Received Spravato 84 mg today as scheduled.  Tolerated it well without nausea or vomiting headache or chest pain or palpitations.  She is still depressed but admits better function and is able to enjoy social interactions. Tolerating meds.  Would like to feel better for sure. Not herself.  Flat. Plan increase Auvelity to 1 tab BID as planned and reduce Quetiapine to 1/2 of ER 300 mg  bc NR for depression.  04/25/2022 appointment with the following noted: clonidine 0.3 mg twice daily, lorazepam 1 mg twice daily for anxiety and Adderall XR 20 mg in the morning. Seroquel XR  300 HS No obious SE but she still thinks quetiapine XR is making her feel down.  But not sedated Received Spravato 84 mg today as scheduled.  Tolerated it well without nausea or vomiting headache or chest pain or palpitations.  Called yesterday with more anxiety.  Had increased Auvelity for 1 day and reduced Seroquel XR for 1 day.  Felt restless and fearful  05/01/2022 appointment noted: clonidine 0.3 mg twice daily, lorazepam 1 mg twice daily for anxiety and Adderall XR 20 mg in the morning. Seroquel XR 150 HS, Auvelity 1 BID Received Spravato 84 mg today as scheduled.  Tolerated it well without nausea or vomiting headache or chest pain or palpitations.  Nurse has noted patient has called multiple times sometimes asking the same question repeatedly.  It is unclear whether she is truly forgetful or is just anxious seeking reassurance. Patient acknowledges ongoing depression as well as some anxiety but states she has felt a little better in the last couple of days.  She has reduced the Seroquel to 150 mg at night and has increased Auvelity to 1 twice daily but only for 1 day.  So far she seems to be tolerating it.  05/03/22 appt noted: clonidine 0.2 mg twice daily, lorazepam 1 mg twice daily for anxiety and Adderall XR 20 mg in the  morning. Seroquel XR 150 HS, Auvelity 1 BID BP high this am about 170/100 and received extra clonidine 0.2 mg and came to receive Spravato.  Not dizzy, no SOB, nor CP but BP is still high Could not receive Spravato today bc BP high and pulse low at 30 ppm. Still depressed and anxious. Plan: continue trial Auvelity with Spravato She needs to get BP and pulse managed  05/08/22 TC: RTC  H Michael NA and mailbox full.  Could not leave message.  Pt  -  talked to she and H on speaker. H worried over wife.  Vacant stare.  Slurs words at times.  Not smiling. Reduced enjoyment.  Depression.  Withdrawn from usual activities.  Some irritability.  Anxious. Disc her concerns meds are making her worse.  Extensive discussion about her treatment resistant status.  There is a consistent pattern of not taking the medicines long enough to get benefit because she believes the meds are making her worse.  However the symptoms she describes as side effects are exactly the same symptoms that she had prior to taking the medication RX for  the depression.  So it is not clear that these are actual side effects. This is true about the 2 most recent meds including Seroquel and Auvelity.  Recommend psychiatric consultation in hopes of improving her comfort level with taking prescribed medications for a sufficient length of time to provide benefit. Extensive discussion about ECT is the treatment of choice for treatment resistant depression.  Spravato may work if she can comply with consistency.  There are medication options but they take longer to work.   Plan:  Reduce clonidine to 0.1 mg BID DT bradycardia.  Talk with PCP about BP and low pulse problems which are interfering with her consistent compliance with Spravato.   Limit lorazepam to 3 -4  mg daily max. Excess use is the cause of slurring speech.  She must stop excess use or will have to stop the med. Stop Auvelity per her request.  But she has only been on the full dose  for a little over a week and clearly has not had time to get benefit from  it.  She thinks maybe it is making her more anxious. Reduce Seroquel from 150XR to 50 -100 mg at night IR.  She couldn't sleep when stopped it completely. Will not start new antidepressant until her SE issues are resolved or not. Get second psych opinion from Yehuda Budd MD or another psychiatrist.  H's sister is therapist in [place], [facility], [facility]  05/16/2022 appointment with the following noted: Received Spravato 84 mg today as scheduled.  Tolerated it well without nausea or vomiting headache or chest pain or palpitations.  She stopped Auvelity as discussed last week. On her own, without physician input, she restarted Wellbutrin XL 450 mg every morning today.  She had taken it in the past.  She feels jittery and anxious. She feels less depressed than she did last week.  But she is still depressed without her usual range of affect.  She still is less social and less motivated than normal. Her primary care doctor increased the dose of losartan Plan: Stop Seroquel Reduce Wellbutrin XL to 300 mg every morning.  Starting the dose at 450 every morning is likely causing side effects of jitteriness and it should not be started at that have a dosage. Recommend she not change meds on her own without MDM put  05/23/2022 appointment with the following noted: Received Spravato 84 mg today as scheduled.  Tolerated it well without nausea or vomiting headache or chest pain or palpitations.  Has not dropped seroquel XR 300 mg 1/2 tablet nightly bc couldn't sleep without it. Has not tried lower dose quetiapine 50 mg HS Still feels depressed.   BP is better managed so far, just saw PCP.  BP is better today and infact is low today. Dropped clonidine as directed from 0.3 mg BID bc inadequate control of BP to 0.2 mg BID.  However she wants to increase it back to 0.3 mg twice daily because she feels it helped her anxiety better.   Wonders about increasing Wellbutrin for depression.  However she has only been on 300 mg a day for a week.  She was on 450 mg daily in the past.  06/06/22 appt noted: Received Spravato 84 mg today as scheduled.  Tolerated it well without nausea or vomiting headache or chest pain or palpitations.  She is still depressed and anxious.  She wants to try to stop the Seroquel but cannot sleep without some of it.  She is taking lorazepam 1 mg 4 times daily and still having a lot of anxiety.  She wants to increase clonidine back to 0.3 mg twice daily.  She hopes for more improvement She recently went for a second psychiatric opinion as suggested the results of that are pending.  06/11/22 appt noted: Received Spravato 84 mg today as scheduled.  Tolerated it well without nausea or vomiting headache or chest pain or palpitations.  She is still depressed and anxious. Without much change.  Still hopeless, anhedonia, reduced inteterest and motivation.  Tolerating meds. Disc concerns Spravato is not hleping much. Plan: stop Seroquel and start olanzapine 10 mg HS for TRD and anxiety.  06/13/2022 appointment noted: Received Spravato 84 mg today as scheduled.  Tolerated it well without nausea or vomiting headache or chest pain or palpitations.  She is still depressed and anxious. Without much change.  Still hopeless, anhedonia, reduced inteterest and motivation.  Tolerating meds. Disc concerns Spravato is not helping much as hoped but is improving a bit in the last week. Tolerating meds. Continues Wellbutrin XL  450 AM, tolerating recently started olanzapine  10 mg HS. Sleep is good.   Pending appt with [place] consult.  06/18/22 appt noted: Received Spravato 84 mg today as scheduled.  Tolerated it well without nausea or vomiting headache or chest pain or palpitations.  Tolerating meds. Continues Wellbutrin XL 450 AM, tolerating recently started olanzapine  10 mg HS. Continues Adderall XR 20 amd and has tried to reduce  lorazepam to $RemoveBefo'1mg'[id number]$  TID Sleep is good.   Pending appt with [place] consult. Depression is a little bit better in the last week with a little improvement in emotional expression and interest.  She is pushing herself to be more active.  Her daughter thought she was a little better than she has been.  However she is still depressed and still not her normal self with anhedonia and reduced emotional expressiveness.  06/20/22 appt noted: Received Spravato 84 mg today as scheduled.  Tolerated it well without nausea or vomiting headache or chest pain or palpitations.  Tolerating meds with a little sleepiness. Continues Wellbutrin XL 450 AM, tolerating recently started olanzapine  10 mg HS. Continues Adderall XR 20 amd and has tried to reduce lorazepam to $RemoveBefo'1mg'tSwcNPByUtr$  TID Sleep is good.   Mood is improving.  Better funciton.  Anxiety is better with olanzapine. Still not herself and depression not gone with some anhedonia and social avoidance and feeling overwhelmed.  8/14 2023 received Spravato 84 mg 06/27/2022 received Spravato Spravato 84 mg 07/02/2022 received Spravato 84 mg 07/04/2022 received Spravato 84 mg  07/09/2022 appointment noted: Received Spravato 84 mg today as scheduled.  Tolerated it well without nausea or vomiting headache or chest pain or palpitations.  Expected dissociation and feels less depressed with resolution of negative emotions immediately after Spravato and then depression, anxiety creep back in. Continues meds Adderall XR 20 mg every morning, Wellbutrin XL 450 every morning, clonidine 0.1 mg twice daily, lorazepam 1 mg every 6 hours as needed, olanzapine increased from 7.5 to 10 mg nightly on 07/04/2022. Tolerating meds.  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  She does note some increased appetite and is somewhat concerned about that but has not gained significant amounts  of weight. She has had the [place] consultation which was initially denied but she knows it can be appealed.  However because she is improving with Spravato plus the other medications now she wants to continue the current treatment plan.  07/18/22 appt noted: Continues meds Adderall XR 20 mg every morning, Wellbutrin XL 450 every morning, clonidine 0.1 mg twice daily, lorazepam 1 mg every 6 hours as needed, olanzapine increased from 7.5 to 10 mg nightly on 07/04/2022. Received Spravato 84 mg today as scheduled.  Tolerated it well without nausea or vomiting headache or chest pain or palpitations.  Expected dissociation and feels less depressed with resolution of negative emotions immediately after Spravato and then depression, anxiety creep back in. Continues meds Adderall XR 20 mg every morning, Wellbutrin XL 450 every morning, clonidine 0.1 mg twice daily, lorazepam 1 mg every 6 hours as needed, olanzapine increased from 7.5 to 10 mg nightly on 07/04/2022. Tolerating meds.  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  She does note some increased appetite and is somewhat concerned about that but has not gained significant amounts of  weight. She has had the [facility] consultation which was initially denied but she knows it can be appealed. She continues to have chronic ambivalence about psychiatric medicines and initially tends to blame her depressive symptoms such as decreased concentration and feeling flat on what ever medicine she currently is taking even though she had the same symptoms before the current medicines were started.  Then after discussion she does admit that her depressive symptoms are improved since adding olanzapine but still has those residual symptoms noted.    Past Psychiatric Medication Trials: fluoxetine, duloxetine, Viibryd, lamotrigine, Pristiq, sertraline, citalopram,   Trintellix anxious and SI Wellbutrin XL 450  Adderall, Adderall XR, Vyvanse, Ritalin, Strattera low dose NR Lorazepam Trazodone  Depakote,  lamotrigine cog complaints Lithium remotely Abilify 7.5  Vraylar 1.5 mg daily agitation and insomnia Rexulti insomnia Latuda 40 one dose, CO anxious and SI Seroquel XR 300  At visit November 12, 2019. We discussed Patient developed an increasingly severe alcohol dependence problem since her last visit in January.  She went to [facility] and has had no alcohol since then except 1 day.  She never abused stimulants but they took her off the stimulants at [facility].  Her ADD was markedly worse.  The Wellbutrin did not help the ADD.   D history lamotrigine rash at 66 yo  Review of Systems:  Review of Systems  Constitutional:  Positive for fatigue.  Cardiovascular:  Negative for palpitations.  Musculoskeletal:  Positive for arthralgias and back pain. Negative for joint swelling.       SP hip surgery October 2020  Neurological:  Negative for dizziness.  Psychiatric/Behavioral:  Positive for decreased concentration and dysphoric mood. Negative for agitation, behavioral problems, confusion, hallucinations, self-injury, sleep disturbance and suicidal ideas. The patient is nervous/anxious. The patient is not hyperactive.     Medications: I have reviewed the patient's current medications.  Current Outpatient Medications  Medication Sig Dispense Refill   amLODipine (NORVASC) 2.5 MG tablet Take 2.5 mg by mouth daily.     amphetamine-dextroamphetamine (ADDERALL XR) 20 MG 24 hr capsule Take 1 capsule (20 mg total) by mouth every morning. 30 capsule 0   amphetamine-dextroamphetamine (ADDERALL XR) 20 MG 24 hr capsule Take 1 capsule (20 mg total) by mouth every morning. 30 capsule 0   buPROPion (WELLBUTRIN XL) 150 MG 24 hr tablet TAKE 3 TABLETS BY MOUTH DAILY. 90 tablet 0   cloNIDine (CATAPRES) 0.2 MG tablet TAKE 1 TABLET BY MOUTH 2 TIMES DAILY.  60 tablet 1   Esketamine HCl, 84 MG Dose, (SPRAVATO, 84 MG DOSE,) 28 MG/DEVICE SOPK USE 3 SPRAYS IN EACH NOSTRIL TWICE A WEEK 3 each 2   iron polysaccharides (NIFEREX) 150 MG capsule TAKE 1 CAPSULE BY MOUTH EVERY DAY 90 capsule 1   LORazepam (ATIVAN) 1 MG tablet Take 1 tablet (1 mg total) by mouth every 6 (six) hours as needed for anxiety. 100 tablet 0   losartan (COZAAR) 50 MG tablet Take 50 mg by mouth daily.     nebivolol (BYSTOLIC) 2.5 MG tablet Take 2.5 mg by mouth daily.     OLANZapine (ZYPREXA) 10 MG tablet TAKE 1 TABLET BY MOUTH EVERYDAY AT BEDTIME 90 tablet 0   No current facility-administered medications for this visit.    Medication Side Effects: None  Allergies:  Allergies  Allergen Reactions   Metronidazole Shortness Of Breath and Other (See Comments)    Heart pounding   Ferrlecit [Na Ferric Gluc Cplx In Sucrose] Other (See Comments)  Infusion reaction 05/12/2019    Past Medical History:  Diagnosis Date   ADHD    Anemia    Anxiety    Arthritis    Depression    Heart murmur    i went to see a cardiologit slast eyar  and i had zero plaque,    PONV (postoperative nausea and vomiting)    Recovering alcoholic in remission ([place])     Family History  Problem Relation Age of Onset   Atrial fibrillation Mother    CAD Father     Social History   Socioeconomic History   Marital status: Married    Spouse name: Not on file   Number of children: Not on file   Years of education: Not on file   Highest education level: Not on file  Occupational History   Not on file  Tobacco Use   Smoking status: Former    Types: Cigarettes    Quit date: 08/16/2003    Years since quitting: 18.9   Smokeless tobacco: Never   Tobacco comments:     08-28-2019 "i smoked 2 cigarettes in the last month since my father  passed"  Vaping Use   Vaping Use: Never used  Substance and Sexual Activity   Alcohol use: Yes    Alcohol/week: 10.0 standard drinks of alcohol    Types: 10 Glasses  of wine per week   Drug use: No   Sexual activity: Not on file  Other Topics Concern   Not on file  Social History Narrative   Not on file   Social Determinants of Health   Financial Resource Strain: Not on file  Food Insecurity: Not on file  Transportation Needs: Not on file  Physical Activity: Not on file  Stress: Not on file  Social Connections: Not on file  Intimate Partner Violence: Not on file    Past Medical History, Surgical history, Social history, and Family history were reviewed and updated as appropriate.   Please see review of systems for further details on the patient's review from today.   Objective:   Physical Exam:  There were no vitals taken for this visit.  Physical Exam Constitutional:      General: She is not in acute distress. Neurological:     Mental Status: She is alert and oriented to person, place, and time.     Coordination: Coordination normal.     Gait: Gait normal.  Psychiatric:        Attention and Perception: Attention and perception normal.        Mood and Affect: Mood is anxious and depressed. Affect is not labile, blunt, tearful or inappropriate.        Speech: Speech is not rapid and pressured or slurred.        Behavior: Behavior is not slowed.        Thought Content: Thought content is not paranoid or delusional. Thought content does not include homicidal or suicidal ideation. Thought content does not include suicidal plan.        Cognition and Memory: Cognition normal. Memory is impaired. She does not exhibit impaired recent memory.        Judgment: Judgment normal.     Comments: Insight intact. No auditory or visual hallucinations. No delusions.  Depression clearly improved with the switch from Seroquel to olanzapine.  Anxiety is further improved as well. Affect still depressed but less blunted than it was with some smiling a nd joking not present before No Laura Chang intent  plan      Lab Review:     Component Value Date/Time    NA 137 01/12/2021 1430   NA 140 11/18/2018 1544   K 3.8 01/12/2021 1430   CL 108 01/12/2021 1430   CO2 22 01/12/2021 1430   GLUCOSE 94 01/12/2021 1430   BUN 14 01/12/2021 1430   BUN 20 11/18/2018 1544   CREATININE 0.82 01/12/2021 1430   CALCIUM 8.9 01/12/2021 1430   PROT 6.6 01/12/2021 1430   ALBUMIN 3.9 01/12/2021 1430   AST 12 (L) 01/12/2021 1430   ALT 11 01/12/2021 1430   ALKPHOS 46 01/12/2021 1430   BILITOT 0.5 01/12/2021 1430   GFRNONAA >60 01/12/2021 1430   GFRAA >60 09/02/2019 0249   GFRAA >60 01/27/2019 0811       Component Value Date/Time   WBC 4.5 01/12/2021 1430   RBC 4.32 01/12/2021 1430   HGB 12.8 01/12/2021 1430   HGB 12.9 07/17/2019 0953   HCT 38.5 01/12/2021 1430   HCT 21.9 (L) 12/25/2018 1221   PLT 272 01/12/2021 1430   PLT 286 07/17/2019 0953   MCV 89.1 01/12/2021 1430   MCH 29.6 01/12/2021 1430   MCHC 33.2 01/12/2021 1430   RDW 12.4 01/12/2021 1430   LYMPHSABS 1.4 01/12/2021 1430   MONOABS 0.4 01/12/2021 1430   EOSABS 0.0 01/12/2021 1430   BASOSABS 0.0 01/12/2021 1430    No results found for: "POCLITH", "LITHIUM"   No results found for: "PHENYTOIN", "PHENOBARB", "VALPROATE", "CBMZ"   .res Assessment: Plan:    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  Have  discussed some of her recent abnormal behaviors leading to this depressive episode getting worse which she says were associated with heavy use of delta 8 and not a manic episode.  She realizes now that that was not good for her.  She stopped all use of other drugs including those available over-the-counter such as delta 8 or any other THC related products.  She is no longer having any of those types of behaviors and instead is depressed.  She  remainsdepressed with lack of interest and lack of feeling for things that normally she would have feelings about.  She has low motivation and energy.  She is difficulty making decisions.  She is sad and down.  She is less productive than usual.  Her concentration is poor.  She has high degree of anxiety as well. She has a very negative self-image and low self-esteem.  These are all uncharacteristic for her However all of these symptoms are partially improved through Spravato and the switch from Seroquel to olanzapine.  She is approximately 40 to 50% better.  Because of the question of cognitive issues we will check TSH, B12 and folate.  Pending She has still not done these blood tests.  Blood pressure has become more normal, so she was able to receive Spravato.   Patient was administered Spravato 84 mg intranasally today.  The patient experienced the typical dissociation which gradually resolved over the 2-hour period of observation.  There were no complications.  Specifically the patient did not have nausea or vomiting or headache.  Blood pressures remained within normal ranges at the 40-minute and 2-hour follow-up intervals.  By the time the 2-hour observation period was met the patient was alert and oriented  and able to exit without assistance.  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  See nursing note for further details.  Failed multiple antidepressants.  Many of them were not actual failures but intolerances and it is unclear whether some of that was more connected with anxiety than true side effects.  1 example is the [place].  In general she does not want to try anything but an antidepressant but has failed all major categories of antidepressants except TCAs and MAO inhibitors which have not been tried.  Could consider a TCA but given her manic response to delta 8 there is some concern about the risk of  a TCA triggering mania.  It could be combined with lithium but she generally is unfavorable about taking lithium.  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  She wants to continue Spravato. We discussed discussed the side effects in detail as well as the protocol required to receive Spravato.    Started olanzapine 10 mg HS on 7.31. 23 and stopped Seroquel  Depression markedly improve with the switch from Seroquel to olanzapine.  Anxiety is further improved as well.  continue Wellbutrin XL to prior dose 450 mg AM  Started Spravato 84 mg twice weekly on 03/16/2022.  But has missed several doses due to elevated blood pressure prior to administration  Adderall  XR 20 mg AM  Ok temporary Ativan 1 mg 3-4 times daily as needed anxiety but try to cut it back. Is not ideal to use benzodiazepine with stimulant but because of the severity of her symptoms it has been necessary.  Hope to eventually eliminate the benzodiazepine.  Expected as her depression improves her anxiety will improve as well.  However lately her anxiety has been unmanageable.  We will expect that to improve as the depression improves.  She has headed insturctions to reduce this.  Continue clonidine 0.2 mg BID off label for anxiety and helps BP partially. BP is better controlled.  Discussed potential benefits, risks, and side effects of stimulants with patient to include increased heart rate, palpitations, insomnia, increased anxiety, increased irritability, or decreased appetite.  Instructed patient to contact office if experiencing any significant tolerability issues. She wants to return to usual dose of Adderall for ADD bc of mor poor cognitive function with reduction.  Also discussed that depression will impair cognitive function.  Discussed safety plan at length with patient.  Advised patient to  contact office with any worsening signs and symptoms.  Instructed patient to go to the [facility] emergency room for evaluation if experiencing any acute safety concerns, to include suicidal intent.  [place] consultation was initially denied but she understands is being appealed if needed.  However because she is improving recently she wants to continue the current treatment plan.  Her symptoms have been very severe up until present but she is beginning to see more normal affect and her family notices the improvement as well.  Extensive discussion again about her chronic ambivalence about psychiatric medicines and tendency to blame medicine for the symptoms of depression that she had prior to even starting the medications.  After discussing this at length she was able to acknowledge that that is factual.  She does not appear to be having any significant side effects with the current medications.  Has Maintained sobriety  No med change today  FU with Spravato twice weekly unless starts [place] then will stop it.  Lynder Parents, MD, DFAPA     Please see After Visit Summary for patient specific instructions.  Future Appointments  Date Time Provider [place]  07/23/2022  9:20 AM Mozingo, Berdie Ogren, NP CP-[facility] None  07/23/2022  9:30 AM CP-NURSE CP-[facility] None  07/30/2022  9:30 AM Cottle, Billey Co., MD CP-CP None  07/30/2022  9:30 AM CP-NURSE CP-CP None  08/06/2022  9:30 AM Cottle, Billey Co., MD CP-CP None  08/06/2022  9:30 AM CP-NURSE CP-CP None            No orders of the defined types were placed in this encounter.      -------------------------------

## 2022-07-23 ENCOUNTER — Ambulatory Visit (INDEPENDENT_AMBULATORY_CARE_PROVIDER_SITE_OTHER): Payer: 59 | Admitting: Adult Health

## 2022-07-23 ENCOUNTER — Other Ambulatory Visit: Payer: Self-pay | Admitting: Psychiatry

## 2022-07-23 ENCOUNTER — Ambulatory Visit: Payer: 59

## 2022-07-23 VITALS — BP 92/72 | HR 62

## 2022-07-23 DIAGNOSIS — F339 Major depressive disorder, recurrent, unspecified: Secondary | ICD-10-CM

## 2022-07-23 DIAGNOSIS — F900 Attention-deficit hyperactivity disorder, predominantly inattentive type: Secondary | ICD-10-CM

## 2022-07-23 MED ORDER — AMPHETAMINE-DEXTROAMPHET ER 20 MG PO CP24: 20.0000 mg | ORAL_CAPSULE | ORAL | 0 refills | Status: DC

## 2022-07-23 NOTE — Progress Notes (Signed)
Laura Chang [id number] [birth year] 66 y.o.  Subjective:   Patient ID:  Laura Chang is a 66 y.o. (DOB 1955-12-28) female.  Chief Complaint: No chief complaint on file.   HPI Laura Chang presents to the office today for follow-up of (TRD) treatment resistant depression.  Patient was administered Spravato 84 mg intranasally today. Patient was observed by provider throughout [facility] treatment. The patient experienced the typical dissociation which gradually resolved over the 2-hour period of observation. There were no complications. Specifically, the patient did not have any untoward side effects - feeling disconnected from themself, their thoughts, feelings and things around them, dizziness, nausea, feeling sleepy, decreased feeling of sensitivity (numbness) spinning sensation, feeling anxious, lack of energy, increased blood pressure, feeling happy or very excited, or headache. Blood pressures remained within normal ranges at the 40-minute and 2-hour follow-up intervals. By the time the 2-hour observation period was met the patient was alert and oriented and able to exit without assistance. 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 See nursing note for further details.   Kriste Basque    Flowsheet Row Clinical Support from 07/04/2022 in [place] from 05/21/2022 in [place] Visit from 03/02/2022 in [facility]  MADRS Total Score 15 27 46        Review of Systems:  Review of Systems  Musculoskeletal:  Negative for gait problem.  Neurological:  Negative for tremors.  Psychiatric/Behavioral:         Please refer to HPI    Medications: I have reviewed the patient's current medications.  Current Outpatient Medications  Medication Sig Dispense Refill   amLODipine (NORVASC) 2.5 MG tablet Take 2.5 mg by mouth  daily.     amphetamine-dextroamphetamine (ADDERALL XR) 20 MG 24 hr capsule Take 1 capsule (20 mg total) by mouth every morning. 30 capsule 0   amphetamine-dextroamphetamine (ADDERALL XR) 20 MG 24 hr capsule Take 1 capsule (20 mg total) by mouth every morning. 30 capsule 0   buPROPion (WELLBUTRIN XL) 150 MG 24 hr tablet TAKE 3 TABLETS BY MOUTH DAILY. 90 tablet 0   cloNIDine (CATAPRES) 0.2 MG tablet TAKE 1 TABLET BY MOUTH 2 TIMES DAILY. 60 tablet 1   iron polysaccharides (NIFEREX) 150 MG capsule TAKE 1 CAPSULE BY MOUTH EVERY DAY 90 capsule 1   LORazepam (ATIVAN) 1 MG tablet Take 1 tablet (1 mg total) by mouth every 6 (six) hours as needed for anxiety. 100 tablet 0   losartan (COZAAR) 50 MG tablet Take 50 mg by mouth daily.     nebivolol (BYSTOLIC) 2.5 MG tablet Take 2.5 mg by mouth daily.     OLANZapine (ZYPREXA) 10 MG tablet TAKE 1 TABLET BY MOUTH EVERYDAY AT BEDTIME 90 tablet 0   SPRAVATO, 84 MG DOSE, 28 MG/DEVICE SOPK USE 3 SPRAYS IN EACH NOSTRIL TWICE A WEEK 3 each 2   No current facility-administered medications for this visit.    Medication Side Effects: None  Allergies:  Allergies  Allergen Reactions   Metronidazole Shortness Of Breath and Other (See Comments)    Heart pounding   Ferrlecit [Na Ferric Gluc Cplx In Sucrose] Other (See Comments)    Infusion reaction 05/12/2019    Past Medical History:  Diagnosis Date   ADHD    Anemia    Anxiety    Arthritis    Depression    Heart murmur  i went to see a cardiologit slast eyar  and i had zero plaque,    PONV (postoperative nausea and vomiting)    Recovering alcoholic in remission [facility])     Past Medical History, Surgical history, Social history, and Family history were reviewed and updated as appropriate.   Please see review of systems for further details on the patient's review from today.   Objective:   Physical Exam:  There were no vitals taken for this visit.  Physical Exam Constitutional:      General: She is  not in acute distress. Musculoskeletal:        General: No deformity.  Neurological:     Mental Status: She is alert and oriented to person, place, and time.     Coordination: Coordination normal.  Psychiatric:        Attention and Perception: Attention and perception normal. She does not perceive auditory or visual hallucinations.        Mood and Affect: Mood normal. Mood is not anxious or depressed. Affect is not labile, blunt, angry or inappropriate.        Speech: Speech normal.        Behavior: Behavior normal.        Thought Content: Thought content normal. Thought content is not paranoid or delusional. Thought content does not include homicidal or suicidal ideation. Thought content does not include homicidal or suicidal plan.        Cognition and Memory: Cognition and memory normal.        Judgment: Judgment normal.     Comments: Insight intact     Lab Review:     Component Value Date/Time   NA 137 01/12/2021 1430   NA 140 11/18/2018 1544   K 3.8 01/12/2021 1430   CL 108 01/12/2021 1430   CO2 22 01/12/2021 1430   GLUCOSE 94 01/12/2021 1430   BUN 14 01/12/2021 1430   BUN 20 11/18/2018 1544   CREATININE 0.82 01/12/2021 1430   CALCIUM 8.9 01/12/2021 1430   PROT 6.6 01/12/2021 1430   ALBUMIN 3.9 01/12/2021 1430   AST 12 (L) 01/12/2021 1430   ALT 11 01/12/2021 1430   ALKPHOS 46 01/12/2021 1430   BILITOT 0.5 01/12/2021 1430   GFRNONAA >60 01/12/2021 1430   GFRAA >60 09/02/2019 0249   GFRAA >60 01/27/2019 0811       Component Value Date/Time   WBC 4.5 01/12/2021 1430   RBC 4.32 01/12/2021 1430   HGB 12.8 01/12/2021 1430   HGB 12.9 07/17/2019 0953   HCT 38.5 01/12/2021 1430   HCT 21.9 (L) 12/25/2018 1221   PLT 272 01/12/2021 1430   PLT 286 07/17/2019 0953   MCV 89.1 01/12/2021 1430   MCH 29.6 01/12/2021 1430   MCHC 33.2 01/12/2021 1430   RDW 12.4 01/12/2021 1430   LYMPHSABS 1.4 01/12/2021 1430   MONOABS 0.4 01/12/2021 1430   EOSABS 0.0 01/12/2021 1430    BASOSABS 0.0 01/12/2021 1430    No results found for: "POCLITH", "LITHIUM"   No results found for: "PHENYTOIN", "PHENOBARB", "VALPROATE", "CBMZ"   .res Assessment: Plan:    Plan:  Continue Spravato treatment.   Diagnoses and all orders for this visit:  Recurrent major depression resistant to treatment [facility])     Please see After Visit Summary for patient specific instructions.  Future Appointments  Date Time Provider [place]  07/30/2022  9:30 AM Cottle, Billey Co., MD CP-CP None  07/30/2022  9:30 AM CP-NURSE CP-CP None  08/06/2022  9:30 AM Cottle, Billey Co., MD CP-CP None  08/06/2022  9:30 AM CP-NURSE CP-CP None    No orders of the defined types were placed in this encounter.   -------------------------------

## 2022-07-26 ENCOUNTER — Telehealth: Payer: Self-pay | Admitting: Psychiatry

## 2022-07-26 ENCOUNTER — Telehealth: Payer: Self-pay

## 2022-07-26 ENCOUNTER — Other Ambulatory Visit: Payer: Self-pay | Admitting: Psychiatry

## 2022-07-26 ENCOUNTER — Other Ambulatory Visit: Payer: Self-pay

## 2022-07-26 DIAGNOSIS — F411 Generalized anxiety disorder: Secondary | ICD-10-CM

## 2022-07-26 DIAGNOSIS — F339 Major depressive disorder, recurrent, unspecified: Secondary | ICD-10-CM

## 2022-07-26 DIAGNOSIS — F5105 Insomnia due to other mental disorder: Secondary | ICD-10-CM

## 2022-07-26 NOTE — Telephone Encounter (Signed)
Rx is pended for Dr. Jennelle Human. She is good to go.

## 2022-07-26 NOTE — Telephone Encounter (Signed)
Pt lvm @ 9:03a.  Pt stated she would like refill of Lorazepam.  She didn't confirm which pharmacy, but she also asked for a call back from a nurse, so you can confirm the pharmacy then.  Next appt 9/18

## 2022-07-26 NOTE — Telephone Encounter (Signed)
Please see message from patient. I can send Rx, just want to make sure she isn't requesting higher dosing. Last filled 8/1.

## 2022-07-26 NOTE — Progress Notes (Signed)
NURSES NOTE:   Patient arrived for her 32nd Spravato treatment. Pt is being treated for Treatment Resistant Depression, pt will be receiving 84 mg which will continue to be her maintenance dose, she receives weekly treatments.  Patient arrived and taken to treatment room. Confirmed she had a ride home which is her husband would be coming back to pick up pt when done and sometimes she needs to use Uber if he is unable to pick her up. Pt's Spravato is ordered through local [facility] and delivered to office, all Spravato medication is stored at doctors office per REMS/FDA guidelines. The medication is required to be locked behind two doors per FDA/REMS Protocol. Medication is also disposed of properly per regulations.      Began taking patient's vital signs at 1:25 PM 123/82, pulse 57. Pt presents as feeling better, she looks happier. Instructed patient to blow her nose then recline back to a 45 degree angle. Gave patient first dose 28 mg nasal spray, each nasal spray administered in each nostril as directed and waited 5 minutes between the second and third dose. All 3 doses given pt did not complain of any nausea/vomiting, given a cup of water due to the taste after the administration of Spravato.  She listens to [employer] with spa or relaxing music.  Checked 40 minute vitals at 2:05 PM, 113/74, pulse was 75. Explained she would be monitored for a total time of 120 minutes. Discharge vitals were taken at 3:15 PM 92/72 P 62. Regina Mozingo, NP  met with her and discussed her medication. I walked pt to elevator, where her husband met her for the ride home. Recommend she go home and sleep or just relax on the couch. No driving, no intense activities. Verbalized understanding. Nurse was with pt a total of 70 minutes for clinical. Pt is coming back in on Monday, September 18th. Pt instructed to call office with any problems or questions.      LOT [id number]  EXP [id number] 

## 2022-07-26 NOTE — Telephone Encounter (Signed)
06/12/2022 05/23/2022 1  Lorazepam 1 Mg Tablet 100.00 25 Ca Cot

## 2022-07-26 NOTE — Telephone Encounter (Signed)
Pt called to report she feels like her symptoms are worsening since the increase in Olanzapine to 10 mg. She reports irritability, easily frustrated, more anxious, no motivation, feels more depressed, and feels hollow inside. Pt seemed to be improving over the last few weeks but this past 1-2 weeks her symptoms have worsened and she is definitely not herself. A couple weeks ago she was discussing painting with me and other things she use to do but something is off now. Pt reports she feels like it was the increase in her dose. Informed her I would update Dr. Jennelle Human and follow up with her on his recommendation.

## 2022-07-26 NOTE — Telephone Encounter (Signed)
Noted pt notified.  

## 2022-07-26 NOTE — Telephone Encounter (Signed)
I don't want to make any change until her next appt with me in 4 days.  I want to do this in person.

## 2022-07-30 ENCOUNTER — Ambulatory Visit: Payer: 59

## 2022-07-30 ENCOUNTER — Ambulatory Visit (INDEPENDENT_AMBULATORY_CARE_PROVIDER_SITE_OTHER): Payer: 59 | Admitting: Psychiatry

## 2022-07-30 ENCOUNTER — Encounter: Payer: Self-pay | Admitting: Psychiatry

## 2022-07-30 VITALS — BP 126/79 | HR 66

## 2022-07-30 DIAGNOSIS — F411 Generalized anxiety disorder: Secondary | ICD-10-CM

## 2022-07-30 DIAGNOSIS — F5105 Insomnia due to other mental disorder: Secondary | ICD-10-CM

## 2022-07-30 DIAGNOSIS — F339 Major depressive disorder, recurrent, unspecified: Secondary | ICD-10-CM

## 2022-07-30 DIAGNOSIS — F900 Attention-deficit hyperactivity disorder, predominantly inattentive type: Secondary | ICD-10-CM | POA: Diagnosis not present

## 2022-07-30 DIAGNOSIS — I10 Essential (primary) hypertension: Secondary | ICD-10-CM

## 2022-07-30 NOTE — Progress Notes (Signed)
Laura Chang [id number] [birth year] 66 y.o.  Subjective:   Patient ID:  Laura Chang is a 66 y.o. (DOB 12/01/1955) female.  Chief Complaint:  Chief Complaint  Patient presents with   Follow-up   Depression   Anxiety   Medication Problem   ADD     HPI Laura Chang Laura Chang presents to the office today for follow-up of depression and anxiety and ADD.  seen November 12, 2019.  Melted down in 2020.  Went to [employer] in July.  No withdrawal.  1 drink since then.  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 She was on no stimulant and no SSRI but was taking Strattera and Wellbutrin.  The following changes were made. Stop Strattera. OK restart stimulant bc severe ADD Restart Adderall 1 daily for a few days and if tolerated then restart 1 twice daily. If not tolerated reduce the dosage if needed. May need to stop Wellbutrin if not tolerating the stimulant.  Yes.  DC Wellbutrin Restart Prozac 20 mg daily.  February 2021 appointment with the following noted: Completed grant proposal.  Couldn't doit without Adderall.  Sold a bunch of work.   Adderall XR lasts about 3 pm.  Strength seems about right.  BP been OK.  Not jittery.   Stopped Wellbutrin but had no SE. Mood drastically better with grant proposal and back on fluoxetine.  Less depressed and lethargic.  No anxiety.  Cut back on coffee. Started back with devotions and stronger faith. Plan: Continue Prozac 20 mg daily. May have to increase the dose at some point in the future given that she usually was taking higher dosages but she is getting good response at this time. Restart Wellbutrin off label for ADD since can't get 2 ADDERALL daily. 150 mg daily then 300 mg daily. She can adjust the dose between 150 mg and 300 mg daily to get the optimal effect.   05/11/2020 appointment with the following noted: Has been inconsistent with Prozac and Wellbutrin. Not sure of the effect of Wellbutrin. Biggest deterrent  in work is anxiety.  Some of the work is conceptual and difficult at times.  Can feel she's not up to a project at times.  Overall is OK but would like a steadier benefit from stimulant.  Exhausted from managing concentration and keeping up with things from the day.  Loses things.  Not good keeping up with schedule. Overall productive and emotionally OK. Can feel Adderall wear off. Mood is better in summer and worse in the winter.   F died in 10/18/23 and that is a loss. No SE Wellbutrin. Still attends AA meetings.  Real benefit from Octa last year. Recognizes effect of anemia on ADD and mood.  Had iron infusions last winter. Plan:  Wellbutrin off label for ADD since can't get 2 ADDERALL daily. 150 mg daily then 300 mg daily.  01/24/2021 appointment with following noted: Doing a program called [employer] mindfulness app since Xmas.  CBT app helped the depression.  App helped her focus better.  Lost sign weight. Writing a lot. Before Xmas felt depressed and started negative thinking worse, self denigrating. Not drinking. More isolated.   Recognizes mo is narcissist.    Didn't tell anyone she was born until 3 mos later.  M aloof and uninterested in pt.  Lied about her birthday.  Mo lack of affection even with pt's kids. Going to [place] for a year and it helped her to quit drinking. Also  misses kids being gone with a hole also. Plan: No med changes  05/04/2021 appointment with the following noted: Therapist Laura Chang thinks she's manic. Lost weight to 144#.   States she is still sleeping okay.  Admits she is hyper and recognizes that she is likely manic.  She feels great, euphoric with an increased sense of spiritual connectedness to God.  She has racing thoughts and talks fast and talks a lot and this is noted by her husband.  He thinks she is a bit hyper.  She has been able to maintain sobriety although she will have 1 glass of wine on special occasions but does not drink by herself.   She is not drinking to excess.  She denies any dangerous impulsivity.  She is clearly not depressed and not particularly anxious.  She has no concerns about her medication and she has been compliant.  06/16/21 appt noted: So much better.  Going through a lot but the manic thing happened on top of it.  So much slower.  Didn't feel like losing anything with risperidone.  Likes the Adderalll at 10 mg. Some drowsiness in the AM and very drowsy from risperidone 2 mg HS. Prayer life is better. Handling stress better. Less depressed with risperidone. Still likes trazodone. Sleeps well. Plan: Reduce Prozac to 10 mg daily.  Consider stopping it because it can feel the mania however she is reluctant to do that because she fears relapse of depression. Reduce risperidone to 1.5 mg nightly due to side effects.  Discussed risk of worsening mania.  07/25/2021 appointment with the following noted: Misses the Adderall and hard to function without it. Depressed now. Heavy chest.  Anxious and guilty.  Body feels heavy.   Hates Wellbutrin.   Plan: Increase fluoxetine to 20 mg daily Add Abilify 1/2 of 15 mg tablet daily Wean wellbutrin by 1 tablet each week  bc she feels it is not helpful and DT polypharmacy Reduce risperidone to 1 daily for 1 week and stop it. Disc risk of mania. Increase Adderall to XR 20 mg AM  08/08/21 Much less depressed and starting to feel normal I feel a lot better. No SE.  Speech normal off risperidone. Sleeping OK on trazaodone and enough.   Noticed benefit from Adderall again. Plan: continue fluoxetine to 20 mg daily Continue Abilify 1/2 of 15 mg tablet daily for depression and mania continue Adderall to XR 20 mg AM  10/10/2021 phone call: Pt stated she feels like the Abilify should be decreased to 53m.She said she is depressed but rational and not suicidal.She has an appt Monday and can wait until then if you prefer. MD response: Reduce the Abilify to 7.5 mg every other day.  We  will meet on 10/16/2021 and decide what to do from there.  10/16/2021 appointment with the following noted: More depressed.  Most depressed I've ever been.  Just numb.  Sense of grief.   Thinks the manic episode was unlike anything else she ever had.  Doesn't want to medicate against it.  Don't enjoy people.  Easily overwhelmed.  Had some death thoughts but not suicidal.  Has been functional.  Feels better today after reducing Abilify to every other day but she is only been doing that for 3 days. A/P: Episode of post manic depression was explained. continue fluoxetine to 20 mg daily Hold Abilify for 1 week then resume Abilify 1/2 of 15 mg tablet every other day for depression and mania continue Adderall to XR 20  mg AM  10/27/2021 appointment with the following noted: I'm doing so much better.  Handling the depression better. Better self talk and spiritual focus has helped.   Dep 6/10 manifesting as anxiety with low confidence.   F died 2  years ago and M 65 yo and is dependent . She is working hard to feel better but still feels depressed.  She almost feels like she has a little more anxiety since restarting Abilify every other day. Plan: continue fluoxetine to 20 mg daily DC Abilify .  Vrayalar 1.5 mg QOD to try to get rid of depression ASAP. continue Adderall to XR 20 mg AM  11/10/2021 appointment with the following noted: Busy with Xmas and it was fun with family but then a big let down.  Did well with it.  Functioned well with it.  Working hard on things with depression.  Not shutting down. Not sure but feels better today but yesterday was hard.  Difficulty dealing with mother.  She won't do anything to help herself.  Yesterday with her all day.  Won't do PT and has isolated herself.    Lack of confidence.   No SE with Vraylar.  11/24/21 urgent appointment appt noted: More and more depressed.   So anxious and doesn't want to be alone but can do so. No appetite. Hurts inside. Has had  some fleeting suicidal thoughts but would not act on them.  Tolerating meds. Has been consistent with Vraylar 1.5 mg every other day, fluoxetine 20 mg daily Plan: Increase Vraylar to 1.5 mg daily Change Prozac to Trintellix 10 mg daily. Discussed side effects of each continue Adderall to XR 20 mg AM  12/27/2021 appointment with the following noted: Not OK.  I feel less depressed but feels bat shit. Not sleeping well.  Extremely anxious. Off and on sleep. 3-4 hours of sleep.   Still having daily SI.  But also become obvious has so much to do.  Overwhelmed by tasks.   Needs anxiety meds to just function. Not more motivated.  Walked yesterday.   Feels afraid like in trouble but not irritable or angry. DC DT agitation Vraylar to 1.5 mg daily Change Prozac to Trintellix 10 mg daily. Hold Adderall to XR 20 mg AM Clonidine 0.1 1/2 tablet twice daily for 2 days and if needed for anxiety and sleep increase to 1 twice daily Ok temporary Ativan 1 mg 3 times daily as needed anxiety  01/05/22 appt noted: Off fluoxetine and  Trintellix.  Only on Ativan, trazodone and Adderall XR 20 plus added clonidine 0.1 mg BID Didn't think she needed to start Trintellix. Not taking Ativan.   Didn't like herself last week. Feels some better today. Wonders if the manic sx Not agitated.  Anxiety kind of calmed down.  A lot to be anxious about situationally.  $ stress. Concerns about downers with meds. Can't access normal personality. ? Lethargy and inability to talk as sE. Plan: Latuda 20-40 mg daily with food. Adderall to XR 20 mg AM Clonidine 0.1 1/2 tablet twice daily  reduce dose to be sure no SE Ok temporary Ativan 1 mg 3 times daily as needed anxiety  01/19/22 appt noted: Taking Latuda 20 mg daily.  Took 40 mg once and felt anxious and  SI Still depressed and not very reactive Anxiety mainly about the depression and fears of the future. She wants to revisit manic sx and thinks it was maybe bc taking  delta 8 bc was taking a lot  of it so still doesn't think she's classic bipolar. She wants to only take Prozac bc thinks Latuda is perpetuating depression. Says the delta 8 was very psychaedelic.  When not taking it was not manic.  Sleeping ok again.  Plan: Per her request DC Latuda 20-40 mg daily with food. She wants to continue Prozac alone AMA  Adderall to XR 20 mg AM Clonidine 0.1 1/2 tablet twice daily  reduce dose to be sure no SE Ok temporary Ativan 1 mg 3 times daily as needed anxiety  01/23/2022 phone call complaining of increased anxiety since stopping Latuda.  She will try increasing clonidine.  01/26/2022 phone call not feeling well and wanted to restart the Vraylar.  However notes indicate that had made her agitated therefore she was encouraged to pick up samples of Rexulti 1 mg and start that instead.  02/06/2022 phone call: Stating she felt the Rexulti was helping with depression but she was not sleeping well and obsessing over things.  She was encouraged to increase Rexulti to 2 mg daily and increase trazodone for sleep.  02/09/2022 appointment with the following noted: This was an urgent work in appointment No sleep last night with trazodone 100 mg HS Nothing really better depression or anxiety. Ruminating negative anxious thoughts. Did not tolerate Rexulti because it was causing insomnia.  Does not think it helped depression.  Lacks emotion that she should have.  Lacks her usual personality.  Some hopeless thoughts.  Some death thoughts.  Some suicidal thoughts without plan or intent Plan: DC Rexulti and Prozac & DC trazodone Adderall to XR 20 mg AM Clonidine 0.1 1/tablet twice daily  reduce dose to be sure no SE Ok temporary Ativan 1 mg 3 times daily as needed anxiety Start Seroquel XR 150 mg nightly  03/02/2022 appointment: Langley Gauss called back a few days after starting Seroquel stating it was making her more anxious and more depressed.  This seemed unlikely as this medicine  rarely ever causes anxiety.  She stopped the medication waited 3 days and called back still had anxiety and depression but thought perhaps the anxiety was a little better.  She did not want to take the Seroquel. She knew about the option of Spravato and wanted to pursue that. Now questions whether to return to Seroquel while waiting to start Spravato bc feels just as bad without it and knows she didn't give it enough time to work.   MADRS 46  ECT-MADRS    Flowsheet Row Clinical Support from 07/04/2022 in [facility] from 05/21/2022 in [facility] Visit from 03/02/2022 in [facility]  MADRS Total Score 15 27 46      03/14/22 appt noted: Pt received Spravato 56 mg first dose today with some dissociative sx which were not severe.  She was anxious prior to the administration and felt better after receiving lorazepam 1 mg.  No NV, or HA. Wants to continue Spravato. Ongoing depression and desperate to feel better.  I'm not myself DT deprsssion which is most severe in recent history.  Anhedonia.  Low motivation.  Social avoidance. Continues to think all recent med trials are making her worse.  Sleep ok with Seroquel.  03/16/22 appt noted: Received Spravato 84 mg for the first time.  some dissociative sx which were not severe.  She was anxious prior to the administration and felt better after receiving lorazepam 1 mg.  No NV, or HA. Wants to continue Spravato.   Does not feel  any better or different since the last appt.  Ongoing depression.  Ongoing depression and desperate to feel better.  I'm not myself DT deprsssion which is most severe in recent history.  Anhedonia.  Low motivation.  Social avoidance. Continues to think all recent med trials are making her worse.  Sleep ok with Seroquel.  Does not want to continue Seroquel for TRD.  03/20/2022 appointment noted: Came for Spravato administration today.  However blood pressure was  significantly elevated approximately 180/115.  She was given lorazepam 1 mg and clonidine 0.2 mg to try to get it down. She states she regretted stopping the Seroquel XR 300 mg tablets.  She now realizes it was helpful.  She did not sleep much at all last night.  She did not take the Adderall this morning. 2 to 3 hours after arrival blood pressure was still elevated at  170/110, 62 pulse.  For Spravato administration was canceled for today.  She admits to being anxious and depressed.  She is not suicidal.  She is highly motivated to receive the Spravato.  We discussed getting it tomorrow.  03/22/2022 appointment noted: Patient's blood pressure was never stable enough yesterday in order to get her in for Spravato administration.  She was encouraged to see her primary care doctor.  It is better today.  03/26/2022 appointment with the following noted: Blood pressure was better.  Saw her primary care doctor who started on oral Bystolic 2.5 mg daily. Received Spravato 84 mg today as scheduled.  Tolerated it well without nausea or vomiting headache or chest pain or palpitations.  Her blood pressure was borderline but manageable. She remains depressed and anxious.  She is ambivalent about the medicine and desperate to get to feel better.  Continues to have anhedonia and low energy and low motivation and reduced ability to do things.  Less social.  Not suicidal.  03/28/22 appt noted: Received Spravato 84 mg today as scheduled.  Tolerated it well without nausea or vomiting headache or chest pain or palpitations.  Her blood pressure was borderline but manageable. Has not seen any improvement so far.  Tolerating Seroquel.  Inconsistent with Bystolic and BP has been borderline high. Still depressed and anxious and anhedonia.  Low motivation, energy, productivity. Taking quetiapine and tolerating XR 300 mg nightly.  04/04/22 appt noted: Received Spravato 84 mg today as scheduled.  Tolerated it well without nausea  or vomiting headache or chest pain or palpitations.  Her blood pressure was borderline but manageable. Has not seen any improvement so far.  Tolerating Seroquel.   She still tends to think that the medications are making her worse.  She has said this about each of the recent psychiatric medicines including Seroquel.  However her husband thinks she is improved.  She also admits there is some improvement in productivity.  She still feels highly anxious.  She still does not enjoy things as normal.  She still feels desperate to improve as soon as possible. Has been taking Seroquel XR since 03/20/2022  04/10/22 appt noted: Received Spravato 84 mg today as scheduled.  Tolerated it well without nausea or vomiting headache or chest pain or palpitations.  Her blood pressure was borderline but manageable. Has not seen any improvement so far.  Tolerating Seroquel.  Doesn't like Seroquel bc she thinks it flattens here. 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  Tolerated it well without nausea or vomiting headache or chest pain or palpitations.  Her blood pressure was borderline but manageable. Has not seen any improvement so far.  Tolerating Seroquel.  Doesn't like Seroquel bc she thinks it flattens her. Received Spravato 84 mg today as scheduled.  Tolerated it well without nausea or vomiting headache or chest pain or palpitations.  Her blood pressure was borderline but manageable. Has not seen any improvement so far.  Tolerating Seroquel.  Doesn't like Seroquel bc she thinks it flattens here.  We discussed her ambivalence about it. She is starting Auvelity and has tolerated it the last 2 days without side effect.  She still does not feel like herself and feels flat and not enjoying things with suppressed expressed emotion  04/17/2022  appointment with the following noted: Received Spravato 84 mg today as scheduled.  Tolerated it well without nausea or vomiting headache or chest pain or palpitations.  Her blood pressure was borderline but manageable. Has not seen any improvement so far.  Tolerating Seroquel.  Doesn't like Seroquel bc she thinks it flattens her. She has been tolerating the Auvelity 1 in the morning without side effects for about a week.  She has not noticed significant improvement so far.  She still feels depressed and flat and not herself.  Other people notice that she is flat emotionally.  She is not suicidal.  She does feel discouraged that she is not getting better yet.  04/19/2022 appointment noted: Has increased Auvelity to 1 twice daily for 2 days, continues quetiapine XR 300 mg nightly, clonidine 0.3 mg twice daily, lorazepam 1 mg twice daily for anxiety and Adderall XR 20 mg in the morning. No obious SE but she still thinks quetiapine XR is making her feel down.  But not sedated Received Spravato 84 mg today as scheduled.  Tolerated it well without nausea or vomiting headache or chest pain or palpitations.  Her blood pressure was borderline but manageable. She still feels quite anxious and feels it necessary to take both the clonidine and lorazepam twice a day to manage her anxiety.  She has been consistently down and flat and not herself until yesterday afternoon she noted an improvement in mood and feeling much more like herself with her normal personality reemerging.  She was quite depressed in the morning with very dark negative thoughts.  She did not have those dark negative thoughts this morning.  She had a lot of questions about medication and when she was expecting to be improved and why she has not shown improvement up to now.  04/23/22 appt noted: Has increased Auvelity to 1 twice daily for 1 week, continues quetiapine XR 300 mg nightly, clonidine 0.3 mg twice daily, lorazepam 1 mg twice daily for  anxiety and Adderall XR 20 mg in the morning. No obious SE but she still thinks quetiapine XR is making her feel down.  But not sedated Received Spravato 84 mg today as scheduled.  Tolerated it well without nausea or vomiting headache or chest pain or palpitations.  She is still depressed but admits better function and is able to enjoy social interactions. Tolerating meds.  Would like to feel better for sure. Not herself.  Flat. Plan increase Auvelity to 1 tab BID as planned and reduce Quetiapine to 1/2 of ER 300 mg  bc NR for depression.  04/25/2022 appointment with the following noted: clonidine 0.3 mg twice daily, lorazepam 1 mg twice daily for anxiety and Adderall XR 20 mg in  the morning. Seroquel XR 300 HS No obious SE but she still thinks quetiapine XR is making her feel down.  But not sedated Received Spravato 84 mg today as scheduled.  Tolerated it well without nausea or vomiting headache or chest pain or palpitations.  Called yesterday with more anxiety.  Had increased Auvelity for 1 day and reduced Seroquel XR for 1 day.  Felt restless and fearful  05/01/2022 appointment noted: clonidine 0.3 mg twice daily, lorazepam 1 mg twice daily for anxiety and Adderall XR 20 mg in the morning. Seroquel XR 150 HS, Auvelity 1 BID Received Spravato 84 mg today as scheduled.  Tolerated it well without nausea or vomiting headache or chest pain or palpitations.  Nurse has noted patient has called multiple times sometimes asking the same question repeatedly.  It is unclear whether she is truly forgetful or is just anxious seeking reassurance. Patient acknowledges ongoing depression as well as some anxiety but states she has felt a little better in the last couple of days.  She has reduced the Seroquel to 150 mg at night and has increased Auvelity to 1 twice daily but only for 1 day.  So far she seems to be tolerating it.  05/03/22 appt noted: clonidine 0.2 mg twice daily, lorazepam 1 mg twice daily for  anxiety and Adderall XR 20 mg in the morning. Seroquel XR 150 HS, Auvelity 1 BID BP high this am about 170/100 and received extra clonidine 0.2 mg and came to receive Spravato.  Not dizzy, no SOB, nor CP but BP is still high Could not receive Spravato today bc BP high and pulse low at 30 ppm. Still depressed and anxious. Plan: continue trial Auvelity with Spravato She needs to get BP and pulse managed  05/08/22 TC: RTC  H Michael NA and mailbox full.  Could not leave message.  Pt  -  talked to she and H on speaker. H worried over wife.  Vacant stare.  Slurs words at times.  Not smiling. Reduced enjoyment.  Depression.  Withdrawn from usual activities.  Some irritability.  Anxious. Disc her concerns meds are making her worse.  Extensive discussion about her treatment resistant status.  There is a consistent pattern of not taking the medicines long enough to get benefit because she believes the meds are making her worse.  However the symptoms she describes as side effects are exactly the same symptoms that she had prior to taking the medication RX for  the depression.  So it is not clear that these are actual side effects. This is true about the 2 most recent meds including Seroquel and Auvelity.  Recommend psychiatric consultation in hopes of improving her comfort level with taking prescribed medications for a sufficient length of time to provide benefit. Extensive discussion about ECT is the treatment of choice for treatment resistant depression.  Spravato may work if she can comply with consistency.  There are medication options but they take longer to work.   Plan:  Reduce clonidine to 0.1 mg BID DT bradycardia.  Talk with PCP about BP and low pulse problems which are interfering with her consistent compliance with Spravato.   Limit lorazepam to 3 -4  mg daily max. Excess use is the cause of slurring speech.  She must stop excess use or will have to stop the med. Stop Auvelity per her request.   But she has only been on the full dose for a little over a week and clearly has not had time  to get benefit from it.  She thinks maybe it is making her more anxious. Reduce Seroquel from 150XR to 50 -100 mg at night IR.  She couldn't sleep when stopped it completely. Will not start new antidepressant until her SE issues are resolved or not. Get second psych opinion from Yehuda Budd MD or another psychiatrist.  H's sister is therapist in [place] [facility], [facility], [facility]  05/16/2022 appointment with the following noted: Received Spravato 84 mg today as scheduled.  Tolerated it well without nausea or vomiting headache or chest pain or palpitations.  She stopped Auvelity as discussed last week. On her own, without physician input, she restarted Wellbutrin XL 450 mg every morning today.  She had taken it in the past.  She feels jittery and anxious. She feels less depressed than she did last week.  But she is still depressed without her usual range of affect.  She still is less social and less motivated than normal. Her primary care doctor increased the dose of losartan Plan: Stop Seroquel Reduce Wellbutrin XL to 300 mg every morning.  Starting the dose at 450 every morning is likely causing side effects of jitteriness and it should not be started at that have a dosage. Recommend she not change meds on her own without MDM put  05/23/2022 appointment with the following noted: Received Spravato 84 mg today as scheduled.  Tolerated it well without nausea or vomiting headache or chest pain or palpitations.  Has not dropped seroquel XR 300 mg 1/2 tablet nightly bc couldn't sleep without it. Has not tried lower dose quetiapine 50 mg HS Still feels depressed.   BP is better managed so far, just saw PCP.  BP is better today and infact is low today. Dropped clonidine as directed from 0.3 mg BID bc inadequate control of BP to 0.2 mg BID.  However she wants to increase it back to 0.3 mg twice daily because she  feels it helped her anxiety better.  Wonders about increasing Wellbutrin for depression.  However she has only been on 300 mg a day for a week.  She was on 450 mg daily in the past.  06/06/22 appt noted: Received Spravato 84 mg today as scheduled.  Tolerated it well without nausea or vomiting headache or chest pain or palpitations.  She is still depressed and anxious.  She wants to try to stop the Seroquel but cannot sleep without some of it.  She is taking lorazepam 1 mg 4 times daily and still having a lot of anxiety.  She wants to increase clonidine back to 0.3 mg twice daily.  She hopes for more improvement She recently went for a second psychiatric opinion as suggested the results of that are pending.  06/11/22 appt noted: Received Spravato 84 mg today as scheduled.  Tolerated it well without nausea or vomiting headache or chest pain or palpitations.  She is still depressed and anxious. Without much change.  Still hopeless, anhedonia, reduced inteterest and motivation.  Tolerating meds. Disc concerns Spravato is not hleping much. Plan: stop Seroquel and start olanzapine 10 mg HS for TRD and anxiety.  06/13/2022 appointment noted: Received Spravato 84 mg today as scheduled.  Tolerated it well without nausea or vomiting headache or chest pain or palpitations.  She is still depressed and anxious. Without much change.  Still hopeless, anhedonia, reduced inteterest and motivation.  Tolerating meds. Disc concerns Spravato is not helping much as hoped but is improving a bit in the last week. Tolerating  meds. Continues Wellbutrin XL 450 AM, tolerating recently started olanzapine  10 mg HS. Sleep is good.   Pending appt with [place] consult.  06/18/22 appt noted: Received Spravato 84 mg today as scheduled.  Tolerated it well without nausea or vomiting headache or chest pain or palpitations.  Tolerating meds. Continues Wellbutrin XL 450 AM, tolerating recently started olanzapine  10 mg HS. Continues  Adderall XR 20 amd and has tried to reduce lorazepam to 71m TID Sleep is good.   [facility]. Depression is a little bit better in the last week with a little improvement in emotional expression and interest.  She is pushing herself to be more active.  Her daughter thought she was a little better than she has been.  However she is still depressed and still not her normal self with anhedonia and reduced emotional expressiveness.  06/20/22 appt noted: Received Spravato 84 mg today as scheduled.  Tolerated it well without nausea or vomiting headache or chest pain or palpitations.  Tolerating meds with a little sleepiness. Continues Wellbutrin XL 450 AM, tolerating recently started olanzapine  10 mg HS. Continues Adderall XR 20 amd and has tried to reduce lorazepam to 123mTID Sleep is good.   Mood is improving.  Better funciton.  Anxiety is better with olanzapine. Still not herself and depression not gone with some anhedonia and social avoidance and feeling overwhelmed.  8/14 2023 received Spravato 84 mg 06/27/2022 received Spravato Spravato 84 mg 07/02/2022 received Spravato 84 mg 07/04/2022 received Spravato 84 mg  07/09/2022 appointment noted: Received Spravato 84 mg today as scheduled.  Tolerated it well without nausea or vomiting headache or chest pain or palpitations.  Expected dissociation and feels less depressed with resolution of negative emotions immediately after Spravato and then depression, anxiety creep back in. Continues meds Adderall XR 20 mg every morning, Wellbutrin XL 450 every morning, clonidine 0.1 mg twice daily, lorazepam 1 mg every 6 hours as needed, olanzapine increased from 7.5 to 10 mg nightly on 07/04/2022. Tolerating meds.  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  She does note some increased appetite and is somewhat concerned about  that but has not gained significant amounts of weight. She has had [place] [facility] which was initially denied but she knows it can be appealed.  However because she is improving with Spravato plus the other medications now she wants to continue the current treatment plan.  07/18/22 appt noted: Continues meds Adderall XR 20 mg every morning, Wellbutrin XL 450 every morning, clonidine 0.1 mg twice daily, lorazepam 1 mg every 6 hours as needed, olanzapine increased from 7.5 to 10 mg nightly on 07/04/2022. Received Spravato 84 mg today as scheduled.  Tolerated it well without nausea or vomiting headache or chest pain or palpitations.  Expected dissociation and feels less depressed with resolution of negative emotions immediately after Spravato and then depression, anxiety creep back in. Continues meds Adderall XR 20 mg every morning, Wellbutrin XL 450 every morning, clonidine 0.1 mg twice daily, lorazepam 1 mg every 6 hours as needed, olanzapine increased from 7.5 to 10 mg nightly on 07/04/2022. Tolerating meds.  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  She does note some increased appetite and is somewhat concerned about that but has not  gained significant amounts of weight. She has had the [place] consultation which was initially denied but she knows it can be appealed. She continues to have chronic ambivalence about psychiatric medicines and initially tends to blame her depressive symptoms such as decreased concentration and feeling flat on what ever medicine she currently is taking even though she had the same symptoms before the current medicines were started.  Then after discussion she does admit that her depressive symptoms are improved since adding olanzapine but still has those residual symptoms noted.  07/23/22 received Spravato 84 mg   07/30/2022 appointment noted: Received  Spravato 84 mg today as scheduled.  Tolerated it well without nausea or vomiting headache or chest pain or palpitations.  Expected dissociation and feels less depressed with resolution of negative emotions immediately after Spravato and then depression, anxiety creep back in. Continues meds Adderall XR 20 mg every morning, Wellbutrin XL 450 every morning, clonidine 0.1 mg twice daily, lorazepam 1 mg every 6 hours as needed, olanzapine increased from 7.5 to 10 mg nightly on 07/04/2022. She has been inconsistent with olanzapine because she continues to be ambivalent about the medications in general and thinks that perhaps the 10 mg is making her feel blunted.  She continues to feel some depression.  She had a good day this week and but still feels somewhat depressed and persistently anxious.   Past Psychiatric Medication Trials: fluoxetine, duloxetine, Viibryd, lamotrigine, Pristiq, sertraline, citalopram,  Trintellix anxious and SI Wellbutrin XL 450  Adderall, Adderall XR, Vyvanse, Ritalin, Strattera low dose NR Lorazepam Trazodone  Depakote,  lamotrigine cog complaints Lithium remotely Abilify 7.5  Vraylar 1.5 mg daily agitation and insomnia Rexulti insomnia Latuda 40 one dose, CO anxious and SI Seroquel XR 300  At visit November 12, 2019. We discussed Patient developed an increasingly severe alcohol dependence problem since her last visit in January.  She went to [facility] and has had no alcohol since then except 1 day.  She never abused stimulants but they took her off the stimulants at [facility].  Her ADD was markedly worse.  The Wellbutrin did not help the ADD.   Chang history lamotrigine rash at 66 yo  Review of Systems:  Review of Systems  Constitutional:  Positive for fatigue.  Cardiovascular:  Negative for palpitations.  Musculoskeletal:  Positive for arthralgias and back pain. Negative for joint swelling.       SP hip surgery October 2020  Neurological:  Negative for  dizziness.  Psychiatric/Behavioral:  Positive for decreased concentration and dysphoric mood. Negative for agitation, behavioral problems, confusion, hallucinations, self-injury, sleep disturbance and suicidal ideas. The patient is nervous/anxious. The patient is not hyperactive.     Medications: I have reviewed the patient's current medications.  Current Outpatient Medications  Medication Sig Dispense Refill   amLODipine (NORVASC) 2.5 MG tablet Take 2.5 mg by mouth daily.     amphetamine-dextroamphetamine (ADDERALL XR) 20 MG 24 hr capsule Take 1 capsule (20 mg total) by mouth every morning. 30 capsule 0   amphetamine-dextroamphetamine (ADDERALL XR) 20 MG 24 hr capsule Take 1 capsule (20 mg total) by mouth every morning. 30 capsule 0   buPROPion (WELLBUTRIN XL) 150 MG 24 hr tablet TAKE 3 TABLETS BY MOUTH DAILY 270 tablet 1   cloNIDine (CATAPRES) 0.2 MG tablet TAKE 1 TABLET BY MOUTH 2 TIMES DAILY. 60 tablet 1   iron polysaccharides (NIFEREX) 150 MG capsule TAKE 1 CAPSULE BY MOUTH EVERY DAY 90 capsule 1   LORazepam (ATIVAN) 1 MG  tablet Take 1 tablet (1 mg total) by mouth every 8 (eight) hours as needed. for anxiety 90 tablet 0   losartan (COZAAR) 50 MG tablet Take 50 mg by mouth daily.     nebivolol (BYSTOLIC) 2.5 MG tablet Take 2.5 mg by mouth daily.     OLANZapine (ZYPREXA) 10 MG tablet TAKE 1 TABLET BY MOUTH EVERYDAY AT BEDTIME (Patient taking differently: Take 7.5 mg by mouth at bedtime.) 90 tablet 0   SPRAVATO, 84 MG DOSE, 28 MG/DEVICE SOPK USE 3 SPRAYS IN EACH NOSTRIL TWICE A WEEK 3 each 2   No current facility-administered medications for this visit.    Medication Side Effects: None  Allergies:  Allergies  Allergen Reactions   Metronidazole Shortness Of Breath and Other (See Comments)    Heart pounding   Ferrlecit [Na Ferric Gluc Cplx In Sucrose] Other (See Comments)    Infusion reaction 05/12/2019    Past Medical History:  Diagnosis Date   ADHD    Anemia    Anxiety     Arthritis    Depression    Heart murmur    i went to see a cardiologit slast eyar  and i had zero plaque,    PONV (postoperative nausea and vomiting)    Recovering alcoholic in remission ([place])     Family History  Problem Relation Age of Onset   Atrial fibrillation Mother    CAD Father     Social History   Socioeconomic History   Marital status: Married    Spouse name: Not on file   Number of children: Not on file   Years of education: Not on file   Highest education level: Not on file  Occupational History   Not on file  Tobacco Use   Smoking status: Former    Types: Cigarettes    Quit date: 08/16/2003    Years since quitting: 18.9   Smokeless tobacco: Never   Tobacco comments:     08-28-2019 "i smoked 2 cigarettes in the last month since my father  passed"  Vaping Use   Vaping Use: Never used  Substance and Sexual Activity   Alcohol use: Yes    Alcohol/week: 10.0 standard drinks of alcohol    Types: 10 Glasses of wine per week   Drug use: No   Sexual activity: Not on file  Other Topics Concern   Not on file  Social History Narrative   Not on file   Social Determinants of Health   Financial Resource Strain: Not on file  Food Insecurity: Not on file  Transportation Needs: Not on file  Physical Activity: Not on file  Stress: Not on file  Social Connections: Not on file  Intimate Partner Violence: Not on file    Past Medical History, Surgical history, Social history, and Family history were reviewed and updated as appropriate.   Please see review of systems for further details on the patient's review from today.   Objective:   Physical Exam:  There were no vitals taken for this visit.  Physical Exam Constitutional:      General: She is not in acute distress. Neurological:     Mental Status: She is alert and oriented to person, place, and time.     Coordination: Coordination normal.     Gait: Gait normal.  Psychiatric:        Attention and  Perception: Attention and perception normal.        Mood and Affect: Mood is anxious and depressed.  Affect is not labile, blunt, tearful or inappropriate.        Speech: Speech is not rapid and pressured or slurred.        Behavior: Behavior is not slowed.        Thought Content: Thought content is not paranoid or delusional. Thought content does not include homicidal or suicidal ideation. Thought content does not include suicidal plan.        Cognition and Memory: Cognition normal. Memory is impaired. She does not exhibit impaired recent memory.        Judgment: Judgment normal.     Comments: Insight intact. No auditory or visual hallucinations. No delusions.  Depression clearly improved with the switch from Seroquel to olanzapine.  Anxiety is further improved as wel but chronic Affect still depressed but less blunted than it was with some smiling and joking not present before No Sui intent plan      Lab Review:     Component Value Date/Time   NA 137 01/12/2021 1430   NA 140 11/18/2018 1544   K 3.8 01/12/2021 1430   CL 108 01/12/2021 1430   CO2 22 01/12/2021 1430   GLUCOSE 94 01/12/2021 1430   BUN 14 01/12/2021 1430   BUN 20 11/18/2018 1544   CREATININE 0.82 01/12/2021 1430   CALCIUM 8.9 01/12/2021 1430   PROT 6.6 01/12/2021 1430   ALBUMIN 3.9 01/12/2021 1430   AST 12 (L) 01/12/2021 1430   ALT 11 01/12/2021 1430   ALKPHOS 46 01/12/2021 1430   BILITOT 0.5 01/12/2021 1430   GFRNONAA >60 01/12/2021 1430   GFRAA >60 09/02/2019 0249   GFRAA >60 01/27/2019 0811       Component Value Date/Time   WBC 4.5 01/12/2021 1430   RBC 4.32 01/12/2021 1430   HGB 12.8 01/12/2021 1430   HGB 12.9 07/17/2019 0953   HCT 38.5 01/12/2021 1430   HCT 21.9 (L) 12/25/2018 1221   PLT 272 01/12/2021 1430   PLT 286 07/17/2019 0953   MCV 89.1 01/12/2021 1430   MCH 29.6 01/12/2021 1430   MCHC 33.2 01/12/2021 1430   RDW 12.4 01/12/2021 1430   LYMPHSABS 1.4 01/12/2021 1430   MONOABS 0.4  01/12/2021 1430   EOSABS 0.0 01/12/2021 1430   BASOSABS 0.0 01/12/2021 1430    No results found for: "POCLITH", "LITHIUM"   No results found for: "PHENYTOIN", "PHENOBARB", "VALPROATE", "CBMZ"   .res Assessment: Plan:    Recurrent major depression resistant to treatment ([place])  Generalized anxiety disorder  Insomnia due to mental condition  Attention deficit hyperactivity disorder (ADHD), predominantly inattentive type  Accelerated hypertension   She has treatment resistant major depression at this time.  Have  discussed some of her recent abnormal behaviors leading to this depressive episode getting worse which she says were associated with heavy use of delta 8 and not a manic episode.  She realizes now that that was not good for her.  She stopped all use of other drugs including those available over-the-counter such as delta 8 or any other THC related products.  She is no longer having any of those types of behaviors and instead is depressed.  She remains depressed with lack of interest and lack of feeling for things that normally she would have feelings about.  She has low motivation and energy.  She is difficulty making decisions.  She is sad and down.  She is less productive than usual.  Her concentration is poor.  She has high degree of anxiety as  well. She has a very negative self-image and low self-esteem.  These are all uncharacteristic for her However all of these symptoms are partially improved through Spravato and the switch from Seroquel to olanzapine.  She is approximately 40 to 50% better.  Because of the question of cognitive issues we will check TSH, B12 and folate.  Pending She has still not done these blood tests.  Blood pressure has become more normal, so she was able to receive Spravato.   Patient was administered Spravato 84 mg intranasally today.  The patient experienced the typical dissociation which gradually resolved over the 2-hour period of observation.   There were no complications.  Specifically the patient did not have nausea or vomiting or headache.  Blood pressures remained within normal ranges at the 40-minute and 2-hour follow-up intervals.  By the time the 2-hour observation period was met the patient was alert and oriented and able to exit without assistance.  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  See nursing note for further details.  Failed multiple antidepressants.  Many of them were not actual failures but intolerances and it is unclear whether some of that was more connected with anxiety than true side effects.  1 example is the [place].  In general she does not want to try anything but an antidepressant but has failed all major categories of antidepressants except TCAs and MAO inhibitors which have not been tried.  Could consider a TCA but given her manic response to delta 8 there is some concern about the risk of a TCA triggering mania.  It could be combined with lithium but she generally is unfavorable about taking lithium.  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  She wants to continue Spravato. We discussed discussed the side effects in detail as well as the protocol required to receive Spravato.    Started olanzapine 10 mg HS on 7.31. 23 and stopped Seroquel .  However changes med dosages on her own AMA Depression markedly improve with the switch from Seroquel to olanzapine.  Anxiety is further improved as well.   Extensive discussion again her ambivalence about meds and missatributing sx of depression as SE of meds.   She agrees to continue olanzapine 10 mg HS for another couple of weeks.  continue Wellbutrin XL to prior dose 450 mg AM  Started Spravato 84 mg twice weekly on 03/16/2022.  But has  missed several doses due to elevated blood pressure prior to administration  Adderall  XR 20 mg AM  Ok temporary Ativan 1 mg 3-4 times daily as needed anxiety but try to cut it back. Is not ideal to use benzodiazepine with stimulant but because of the severity of her symptoms it has been necessary.  Hope to eventually eliminate the benzodiazepine.  Expected as her depression improves her anxiety will improve as well.  However lately her anxiety has been unmanageable.  We will expect that to improve as the depression improves.  She has headed insturctions to reduce this.  Continue clonidine 0.2 mg BID off label for anxiety and helps BP partially. BP is better controlled.  Discussed potential benefits, risks, and side effects of stimulants with patient to include increased heart rate, palpitations, insomnia, increased anxiety, increased irritability, or decreased appetite.  Instructed patient to contact office if experiencing  any significant tolerability issues. She wants to return to usual dose of Adderall for ADD bc of mor poor cognitive function with reduction.  Also discussed that depression will impair cognitive function.  Discussed safety plan at length with patient.  Advised patient to contact office with any worsening signs and symptoms.  Instructed patient to go to the [facility] emergency room for evaluation if experiencing any acute safety concerns, to include suicidal intent.  [place] consultation was initially denied but she understands is being appealed if needed.  However because she is improving recently she wants to continue the current treatment plan.  Her symptoms have been very severe up until present but she is beginning to see more normal affect and her family notices the improvement as well.  Extensive discussion again about her chronic ambivalence about psychiatric medicines and tendency to blame medicine for the symptoms of depression that she had prior to even starting the  medications.  After discussing this at length she was able to acknowledge that that is factual.  She does not appear to be having any significant side effects with the current medications.  Has Maintained sobriety  No med change today  FU with Spravato weekly unless starts TMS then will stop it.  Lynder Parents, MD, DFAPA     Please see After Visit Summary for patient specific instructions.  Future Appointments  Date Time Provider Ava  08/06/2022  9:30 AM Cottle, Billey Co., MD CP-CP None  08/06/2022  9:30 AM CP-NURSE [facility] None            No orders of the defined types were placed in this encounter.      -------------------------------

## 2022-07-30 NOTE — Progress Notes (Signed)
NURSES NOTE:   Patient arrived for her 33rd Spravato treatment. Pt is being treated for Treatment Resistant Depression, pt will be receiving 84 mg which will continue to be her maintenance dose, she receives weekly treatments.  Patient arrived and taken to treatment room. Confirmed she had a ride home which is her husband would be coming back to pick up pt when done and sometimes she needs to use [facility] Popper if he is unable to pick her up. Pt's Spravato is ordered through [employer] and delivered to office, all Spravato medication is stored at doctors office per REMS/FDA guidelines. The medication is required to be locked behind two doors per FDA/REMS Protocol. Medication is also disposed of properly per regulations.      Began taking patient's vital signs at 9:50 AM 119/74, pulse 58. Pt presents as feeling better, she looks happier. Instructed patient to blow her nose then recline back to a 45 degree angle. Gave patient first dose 28 mg nasal spray, each nasal spray administered in each nostril as directed and waited 5 minutes between the second and third dose. All 3 doses given pt did not complain of any nausea/vomiting, given a cup of water due to the taste after the administration of Spravato.  She listens to [employer] with spa or relaxing music.  Checked 40 minute vitals at 10:30 AM, 125/77, pulse was 63. Explained she would be monitored for a total time of 120 minutes. Discharge vitals were taken at 11:45 PM 126/79 P 66. Dr. Clovis Pu  met with her and discussed her medication. I walked pt to elevator, where her husband met her for the ride home. Recommend she go home and sleep or just relax on the couch. No driving, no intense activities. Verbalized understanding. Nurse was with pt a total of 70 minutes for clinical. Pt is coming back in on Monday, September 25th. Pt instructed to call office with any problems or questions.      LOT [id number]  EXP [id number]

## 2022-08-02 ENCOUNTER — Other Ambulatory Visit: Payer: Self-pay | Admitting: Psychiatry

## 2022-08-02 DIAGNOSIS — I10 Essential (primary) hypertension: Secondary | ICD-10-CM

## 2022-08-02 DIAGNOSIS — F411 Generalized anxiety disorder: Secondary | ICD-10-CM

## 2022-08-06 ENCOUNTER — Ambulatory Visit: Payer: 59

## 2022-08-06 ENCOUNTER — Ambulatory Visit (INDEPENDENT_AMBULATORY_CARE_PROVIDER_SITE_OTHER): Payer: 59 | Admitting: Psychiatry

## 2022-08-06 ENCOUNTER — Encounter: Payer: Self-pay | Admitting: Psychiatry

## 2022-08-06 VITALS — BP 118/72 | HR 65

## 2022-08-06 DIAGNOSIS — F411 Generalized anxiety disorder: Secondary | ICD-10-CM | POA: Diagnosis not present

## 2022-08-06 DIAGNOSIS — F339 Major depressive disorder, recurrent, unspecified: Secondary | ICD-10-CM | POA: Diagnosis not present

## 2022-08-06 DIAGNOSIS — I10 Essential (primary) hypertension: Secondary | ICD-10-CM

## 2022-08-06 DIAGNOSIS — F5105 Insomnia due to other mental disorder: Secondary | ICD-10-CM

## 2022-08-06 DIAGNOSIS — F900 Attention-deficit hyperactivity disorder, predominantly inattentive type: Secondary | ICD-10-CM | POA: Diagnosis not present

## 2022-08-06 NOTE — Progress Notes (Signed)
Laura Chang [id number] Apr 25, 1956 66 y.o.  Subjective:   Patient ID:  Laura Chang is a 66 y.o. (DOB 1956-03-11) female.  Chief Complaint:  Chief Complaint  Patient presents with   Follow-up   Depression   Anxiety   Fatigue     HPI Laura Chang presents to the office today for follow-up of depression and anxiety and ADD.  seen November 12, 2019.  Melted down in 2020.  Went to [employer] in July.  No withdrawal.  1 drink since then.  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 She was on no stimulant and no SSRI but was taking Strattera and Wellbutrin.  The following changes were made. Stop Strattera. OK restart stimulant bc severe ADD Restart Adderall 1 daily for a few days and if tolerated then restart 1 twice daily. If not tolerated reduce the dosage if needed. May need to stop Wellbutrin if not tolerating the stimulant.  Yes.  DC Wellbutrin Restart Prozac 20 mg daily.  February 2021 appointment with the following noted: Completed grant proposal.  Couldn't doit without Adderall.  Sold a bunch of work.   Adderall XR lasts about 3 pm.  Strength seems about right.  BP been OK.  Not jittery.   Stopped Wellbutrin but had no SE. Mood drastically better with grant proposal and back on fluoxetine.  Less depressed and lethargic.  No anxiety.  Cut back on coffee. Started back with devotions and stronger faith. Plan: Continue Prozac 20 mg daily. May have to increase the dose at some point in the future given that she usually was taking higher dosages but she is getting good response at this time. Restart Wellbutrin off label for ADD since can't get 2 ADDERALL daily. 150 mg daily then 300 mg daily. She can adjust the dose between 150 mg and 300 mg daily to get the optimal effect.   05/11/2020 appointment with the following noted: Has been inconsistent with Prozac and Wellbutrin. Not sure of the effect of Wellbutrin. Biggest deterrent in work is  anxiety.  Some of the work is conceptual and difficult at times.  Can feel she's not up to a project at times.  Overall is OK but would like a steadier benefit from stimulant.  Exhausted from managing concentration and keeping up with things from the day.  Loses things.  Not good keeping up with schedule. Overall productive and emotionally OK. Can feel Adderall wear off. Mood is better in summer and worse in the winter.   F died in 2023-10-22 and that is a loss. No SE Wellbutrin. Still attends AA meetings.  Real benefit from [place] last year. Recognizes effect of anemia on ADD and mood.  Had iron infusions last winter. Plan:  Wellbutrin off label for ADD since can't get 2 ADDERALL daily. 150 mg daily then 300 mg daily.  01/24/2021 appointment with following noted: Doing a program called [employer] mindfulness app since Xmas.  CBT app helped the depression.  App helped her focus better.  Lost sign weight. Writing a lot. Before Xmas felt depressed and started negative thinking worse, self denigrating. Not drinking. More isolated.   Recognizes mo is narcissist.    Didn't tell anyone she was born until 3 mos later.  M aloof and uninterested in pt.  Lied about her birthday.  Mo lack of affection even with pt's kids. Going to [place] for a year and it helped her to quit drinking. Also misses kids being gone  with a hole also. Plan: No med changes  05/04/2021 appointment with the following noted: Therapist [name] thinks she's manic. Lost weight to 144#.   States she is still sleeping okay.  Admits she is hyper and recognizes that she is likely manic.  She feels great, euphoric with an increased sense of spiritual connectedness to God.  She has racing thoughts and talks fast and talks a lot and this is noted by her husband.  He thinks she is a bit hyper.  She has been able to maintain sobriety although she will have 1 glass of wine on special occasions but does not drink by herself.  She is not  drinking to excess.  She denies any dangerous impulsivity.  She is clearly not depressed and not particularly anxious.  She has no concerns about her medication and she has been compliant.  06/16/21 appt noted: So much better.  Going through a lot but the manic thing happened on top of it.  So much slower.  Didn't feel like losing anything with risperidone.  Likes the Adderalll at 10 mg. Some drowsiness in the AM and very drowsy from risperidone 2 mg HS. Prayer life is better. Handling stress better. Less depressed with risperidone. Still likes trazodone. Sleeps well. Plan: Reduce Prozac to 10 mg daily.  Consider stopping it because it can feel the mania however she is reluctant to do that because she fears relapse of depression. Reduce risperidone to 1.5 mg nightly due to side effects.  Discussed risk of worsening mania.  07/25/2021 appointment with the following noted: Misses the Adderall and hard to function without it. Depressed now. Heavy chest.  Anxious and guilty.  Body feels heavy.   Hates Wellbutrin.   Plan: Increase fluoxetine to 20 mg daily Add Abilify 1/2 of 15 mg tablet daily Wean wellbutrin by 1 tablet each week  bc she feels it is not helpful and DT polypharmacy Reduce risperidone to 1 daily for 1 week and stop it. Disc risk of mania. Increase Adderall to XR 20 mg AM  08/08/21 Much less depressed and starting to feel normal I feel a lot better. No SE.  Speech normal off risperidone. Sleeping OK on trazaodone and enough.   Noticed benefit from Adderall again. Plan: continue fluoxetine to 20 mg daily Continue Abilify 1/2 of 15 mg tablet daily for depression and mania continue Adderall to XR 20 mg AM  10/10/2021 phone call: Pt stated she feels like the Abilify should be decreased to 31m.She said she is depressed but rational and not suicidal.She has an appt Monday and can wait until then if you prefer. MD response: Reduce the Abilify to 7.5 mg every other day.  We will meet on  10/16/2021 and decide what to do from there.  10/16/2021 appointment with the following noted: More depressed.  Most depressed I've ever been.  Just numb.  Sense of grief.   Thinks the manic episode was unlike anything else she ever had.  Doesn't want to medicate against it.  Don't enjoy people.  Easily overwhelmed.  Had some death thoughts but not suicidal.  Has been functional.  Feels better today after reducing Abilify to every other day but she is only been doing that for 3 days. A/P: Episode of post manic depression was explained. continue fluoxetine to 20 mg daily Hold Abilify for 1 week then resume Abilify 1/2 of 15 mg tablet every other day for depression and mania continue Adderall to XR 20 mg AM  10/27/2021  appointment with the following noted: I'm doing so much better.  Handling the depression better. Better self talk and spiritual focus has helped.   Dep 6/10 manifesting as anxiety with low confidence.   F died 2  years ago and M 66 yo and is dependent . She is working hard to feel better but still feels depressed.  She almost feels like she has a little more anxiety since restarting Abilify every other day. Plan: continue fluoxetine to 20 mg daily DC Abilify .  Vrayalar 1.5 mg QOD to try to get rid of depression ASAP. continue Adderall to XR 20 mg AM  11/10/2021 appointment with the following noted: Busy with Xmas and it was fun with family but then a big let down.  Did well with it.  Functioned well with it.  Working hard on things with depression.  Not shutting down. Not sure but feels better today but yesterday was hard.  Difficulty dealing with mother.  She won't do anything to help herself.  Yesterday with her all day.  Won't do PT and has isolated herself.    Lack of confidence.   No SE with Vraylar.  11/24/21 urgent appointment appt noted: More and more depressed.   So anxious and doesn't want to be alone but can do so. No appetite. Hurts inside. Has had some fleeting  suicidal thoughts but would not act on them.  Tolerating meds. Has been consistent with Vraylar 1.5 mg every other day, fluoxetine 20 mg daily Plan: Increase Vraylar to 1.5 mg daily Change Prozac to Trintellix 10 mg daily. Discussed side effects of each continue Adderall to XR 20 mg AM  12/27/2021 appointment with the following noted: Not OK.  I feel less depressed but feels bat shit. Not sleeping well.  Extremely anxious. Off and on sleep. 3-4 hours of sleep.   Still having daily SI.  But also become obvious has so much to do.  Overwhelmed by tasks.   Needs anxiety meds to just function. Not more motivated.  Walked yesterday.   Feels afraid like in trouble but not irritable or angry. DC DT agitation Vraylar to 1.5 mg daily Change Prozac to Trintellix 10 mg daily. Hold Adderall to XR 20 mg AM Clonidine 0.1 1/2 tablet twice daily for 2 days and if needed for anxiety and sleep increase to 1 twice daily Ok temporary Ativan 1 mg 3 times daily as needed anxiety  01/05/22 appt noted: Off fluoxetine and  Trintellix.  Only on Ativan, trazodone and Adderall XR 20 plus added clonidine 0.1 mg BID Didn't think she needed to start Trintellix. Not taking Ativan.   Didn't like herself last week. Feels some better today. Wonders if the manic sx Not agitated.  Anxiety kind of calmed down.  A lot to be anxious about situationally.  $ stress. Concerns about downers with meds. Can't access normal personality. ? Lethargy and inability to talk as sE. Plan: Latuda 20-40 mg daily with food. Adderall to XR 20 mg AM Clonidine 0.1 1/2 tablet twice daily  reduce dose to be sure no SE Ok temporary Ativan 1 mg 3 times daily as needed anxiety  01/19/22 appt noted: Taking Latuda 20 mg daily.  Took 40 mg once and felt anxious and  SI Still depressed and not very reactive Anxiety mainly about the depression and fears of the future. She wants to revisit manic sx and thinks it was maybe bc taking delta 8 bc was  taking a lot of it so still  doesn't think she's classic bipolar. She wants to only take Prozac bc thinks Latuda is perpetuating depression. Says the delta 8 was very psychaedelic.  When not taking it was not manic.  Sleeping ok again.  Plan: Per her request DC Latuda 20-40 mg daily with food. She wants to continue Prozac alone AMA  Adderall to XR 20 mg AM Clonidine 0.1 1/2 tablet twice daily  reduce dose to be sure no SE Ok temporary Ativan 1 mg 3 times daily as needed anxiety  01/23/2022 phone call complaining of increased anxiety since stopping Latuda.  She will try increasing clonidine.  01/26/2022 phone call not feeling well and wanted to restart the Vraylar.  However notes indicate that had made her agitated therefore she was encouraged to pick up samples of Rexulti 1 mg and start that instead.  02/06/2022 phone call: Stating she felt the Rexulti was helping with depression but she was not sleeping well and obsessing over things.  She was encouraged to increase Rexulti to 2 mg daily and increase trazodone for sleep.  02/09/2022 appointment with the following noted: This was an urgent work in appointment No sleep last night with trazodone 100 mg HS Nothing really better depression or anxiety. Ruminating negative anxious thoughts. Did not tolerate Rexulti because it was causing insomnia.  Does not think it helped depression.  Lacks emotion that she should have.  Lacks her usual personality.  Some hopeless thoughts.  Some death thoughts.  Some suicidal thoughts without plan or intent Plan: DC Rexulti and Prozac & DC trazodone Adderall to XR 20 mg AM Clonidine 0.1 1/tablet twice daily  reduce dose to be sure no SE Ok temporary Ativan 1 mg 3 times daily as needed anxiety Start Seroquel XR 150 mg nightly  03/02/2022 appointment: Langley Gauss called back a few days after starting Seroquel stating it was making her more anxious and more depressed.  This seemed unlikely as this medicine rarely ever  causes anxiety.  She stopped the medication waited 3 days and called back still had anxiety and depression but thought perhaps the anxiety was a little better.  She did not want to take the Seroquel. She knew about the option of Spravato and wanted to pursue that. Now questions whether to return to Seroquel while waiting to start Spravato bc feels just as bad without it and knows she didn't give it enough time to work.   MADRS 46  ECT-MADRS    Flowsheet Row Clinical Support from 08/06/2022 in [facility] from 07/04/2022 in [facility] from 05/21/2022 in [facility] Visit from 03/02/2022 in [facility]  MADRS Total Score _0 46      03/14/22 appt noted: Pt received Spravato 56 mg first dose today with some dissociative sx which were not severe.  She was anxious prior to the administration and felt better after receiving lorazepam 1 mg.  No NV, or HA. Wants to continue Spravato. Ongoing depression and desperate to feel better.  I'm not myself DT deprsssion which is most severe in recent history.  Anhedonia.  Low motivation.  Social avoidance. Continues to think all recent med trials are making her worse.  Sleep ok with Seroquel.  03/16/22 appt noted: Received Spravato 84 mg for the first time.  some dissociative sx which were not severe.  She was anxious prior to the administration and felt better after receiving lorazepam 1 mg.  No NV, or HA. Wants to continue Spravato.  Does not feel any better or different since the last appt.  Ongoing depression.  Ongoing depression and desperate to feel better.  I'm not myself DT deprsssion which is most severe in recent history.  Anhedonia.  Low motivation.  Social avoidance. Continues to think all recent med trials are making her worse.  Sleep ok with Seroquel.  Does not want to continue Seroquel for TRD.  03/20/2022 appointment noted: Came for  Spravato administration today.  However blood pressure was significantly elevated approximately 180/115.  She was given lorazepam 1 mg and clonidine 0.2 mg to try to get it down. She states she regretted stopping the Seroquel XR 300 mg tablets.  She now realizes it was helpful.  She did not sleep much at all last night.  She did not take the Adderall this morning. 2 to 3 hours after arrival blood pressure was still elevated at  170/110, 62 pulse.  For Spravato administration was canceled for today.  She admits to being anxious and depressed.  She is not suicidal.  She is highly motivated to receive the Spravato.  We discussed getting it tomorrow.  03/22/2022 appointment noted: Patient's blood pressure was never stable enough yesterday in order to get her in for Spravato administration.  She was encouraged to see her primary care doctor.  It is better today.  03/26/2022 appointment with the following noted: Blood pressure was better.  Saw her primary care doctor who started on oral Bystolic 2.5 mg daily. Received Spravato 84 mg today as scheduled.  Tolerated it well without nausea or vomiting headache or chest pain or palpitations.  Her blood pressure was borderline but manageable. She remains depressed and anxious.  She is ambivalent about the medicine and desperate to get to feel better.  Continues to have anhedonia and low energy and low motivation and reduced ability to do things.  Less social.  Not suicidal.  03/28/22 appt noted: Received Spravato 84 mg today as scheduled.  Tolerated it well without nausea or vomiting headache or chest pain or palpitations.  Her blood pressure was borderline but manageable. Has not seen any improvement so far.  Tolerating Seroquel.  Inconsistent with Bystolic and BP has been borderline high. Still depressed and anxious and anhedonia.  Low motivation, energy, productivity. Taking quetiapine and tolerating XR 300 mg nightly.  04/04/22 appt noted: Received Spravato  84 mg today as scheduled.  Tolerated it well without nausea or vomiting headache or chest pain or palpitations.  Her blood pressure was borderline but manageable. Has not seen any improvement so far.  Tolerating Seroquel.   She still tends to think that the medications are making her worse.  She has said this about each of the recent psychiatric medicines including Seroquel.  However her husband thinks she is improved.  She also admits there is some improvement in productivity.  She still feels highly anxious.  She still does not enjoy things as normal.  She still feels desperate to improve as soon as possible. Has been taking Seroquel XR since 03/20/2022  04/10/22 appt noted: Received Spravato 84 mg today as scheduled.  Tolerated it well without nausea or vomiting headache or chest pain or palpitations.  Her blood pressure was borderline but manageable. Has not seen any improvement so far.  Tolerating Seroquel.  Doesn't like Seroquel bc she thinks it flattens here. 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  Tolerated it well without nausea or vomiting headache or chest pain or palpitations.  Her blood pressure was borderline but manageable. Has not seen any improvement so far.  Tolerating Seroquel.  Doesn't like Seroquel bc she thinks it flattens her. Received Spravato 84 mg today as scheduled.  Tolerated it well without nausea or vomiting headache or chest pain or palpitations.  Her blood pressure was borderline but manageable. Has not seen any improvement so far.  Tolerating Seroquel.  Doesn't like Seroquel bc she thinks it flattens here.  We discussed her ambivalence about it. She is starting Auvelity and has tolerated it the last 2 days without side effect.  She still does not feel like herself and feels flat and not enjoying  things with suppressed expressed emotion  04/17/2022 appointment with the following noted: Received Spravato 84 mg today as scheduled.  Tolerated it well without nausea or vomiting headache or chest pain or palpitations.  Her blood pressure was borderline but manageable. Has not seen any improvement so far.  Tolerating Seroquel.  Doesn't like Seroquel bc she thinks it flattens her. She has been tolerating the Auvelity 1 in the morning without side effects for about a week.  She has not noticed significant improvement so far.  She still feels depressed and flat and not herself.  Other people notice that she is flat emotionally.  She is not suicidal.  She does feel discouraged that she is not getting better yet.  04/19/2022 appointment noted: Has increased Auvelity to 1 twice daily for 2 days, continues quetiapine XR 300 mg nightly, clonidine 0.3 mg twice daily, lorazepam 1 mg twice daily for anxiety and Adderall XR 20 mg in the morning. No obious SE but she still thinks quetiapine XR is making her feel down.  But not sedated Received Spravato 84 mg today as scheduled.  Tolerated it well without nausea or vomiting headache or chest pain or palpitations.  Her blood pressure was borderline but manageable. She still feels quite anxious and feels it necessary to take both the clonidine and lorazepam twice a day to manage her anxiety.  She has been consistently down and flat and not herself until yesterday afternoon she noted an improvement in mood and feeling much more like herself with her normal personality reemerging.  She was quite depressed in the morning with very dark negative thoughts.  She did not have those dark negative thoughts this morning.  She had a lot of questions about medication and when she was expecting to be improved and why she has not shown improvement up to now.  04/23/22 appt noted: Has increased Auvelity to 1 twice daily for 1 week, continues quetiapine XR 300 mg nightly, clonidine 0.3  mg twice daily, lorazepam 1 mg twice daily for anxiety and Adderall XR 20 mg in the morning. No obious SE but she still thinks quetiapine XR is making her feel down.  But not sedated Received Spravato 84 mg today as scheduled.  Tolerated it well without nausea or vomiting headache or chest pain or palpitations.  She is still depressed but admits better function and is able to enjoy social interactions. Tolerating meds.  Would like to feel better for sure. Not herself.  Flat. Plan increase Auvelity to 1 tab BID as planned and reduce Quetiapine to 1/2 of ER 300 mg  bc NR for depression.  04/25/2022 appointment with the following noted: clonidine 0.3 mg twice daily, lorazepam 1 mg twice daily for anxiety and Adderall XR  20 mg in the morning. Seroquel XR 300 HS No obious SE but she still thinks quetiapine XR is making her feel down.  But not sedated Received Spravato 84 mg today as scheduled.  Tolerated it well without nausea or vomiting headache or chest pain or palpitations.  Called yesterday with more anxiety.  Had increased Auvelity for 1 day and reduced Seroquel XR for 1 day.  Felt restless and fearful  05/01/2022 appointment noted: clonidine 0.3 mg twice daily, lorazepam 1 mg twice daily for anxiety and Adderall XR 20 mg in the morning. Seroquel XR 150 HS, Auvelity 1 BID Received Spravato 84 mg today as scheduled.  Tolerated it well without nausea or vomiting headache or chest pain or palpitations.  Nurse has noted patient has called multiple times sometimes asking the same question repeatedly.  It is unclear whether she is truly forgetful or is just anxious seeking reassurance. Patient acknowledges ongoing depression as well as some anxiety but states she has felt a little better in the last couple of days.  She has reduced the Seroquel to 150 mg at night and has increased Auvelity to 1 twice daily but only for 1 day.  So far she seems to be tolerating it.  05/03/22 appt noted: clonidine 0.2 mg  twice daily, lorazepam 1 mg twice daily for anxiety and Adderall XR 20 mg in the morning. Seroquel XR 150 HS, Auvelity 1 BID BP high this am about 170/100 and received extra clonidine 0.2 mg and came to receive Spravato.  Not dizzy, no SOB, nor CP but BP is still high Could not receive Spravato today bc BP high and pulse low at 30 ppm. Still depressed and anxious. Plan: continue trial Auvelity with Spravato She needs to get BP and pulse managed  05/08/22 TC: RTC  H Michael NA and mailbox full.  Could not leave message.  Pt  -  talked to she and H on speaker. H worried over wife.  Vacant stare.  Slurs words at times.  Not smiling. Reduced enjoyment.  Depression.  Withdrawn from usual activities.  Some irritability.  Anxious. Disc her concerns meds are making her worse.  Extensive discussion about her treatment resistant status.  There is a consistent pattern of not taking the medicines long enough to get benefit because she believes the meds are making her worse.  However the symptoms she describes as side effects are exactly the same symptoms that she had prior to taking the medication RX for  the depression.  So it is not clear that these are actual side effects. This is true about the 2 most recent meds including Seroquel and Auvelity.  Recommend psychiatric consultation in hopes of improving her comfort level with taking prescribed medications for a sufficient length of time to provide benefit. Extensive discussion about ECT is the treatment of choice for treatment resistant depression.  Spravato may work if she can comply with consistency.  There are medication options but they take longer to work.   Plan:  Reduce clonidine to 0.1 mg BID DT bradycardia.  Talk with PCP about BP and low pulse problems which are interfering with her consistent compliance with Spravato.   Limit lorazepam to 3 -4  mg daily max. Excess use is the cause of slurring speech.  She must stop excess use or will have to stop  the med. Stop Auvelity per her request.  But she has only been on the full dose for a little over a week and clearly has  not had time to get benefit from it.  She thinks maybe it is making her more anxious. Reduce Seroquel from 150XR to 50 -100 mg at night IR.  She couldn't sleep when stopped it completely. Will not start new antidepressant until her SE issues are resolved or not. Get second psych opinion from Yehuda Budd MD or another psychiatrist.  H's sister is therapist in [place] [facility], MD, [facility]  05/16/2022 appointment with the following noted: Received Spravato 84 mg today as scheduled.  Tolerated it well without nausea or vomiting headache or chest pain or palpitations.  She stopped Auvelity as discussed last week. On her own, without physician input, she restarted Wellbutrin XL 450 mg every morning today.  She had taken it in the past.  She feels jittery and anxious. She feels less depressed than she did last week.  But she is still depressed without her usual range of affect.  She still is less social and less motivated than normal. Her primary care doctor increased the dose of losartan Plan: Stop Seroquel Reduce Wellbutrin XL to 300 mg every morning.  Starting the dose at 450 every morning is likely causing side effects of jitteriness and it should not be started at that have a dosage. Recommend she not change meds on her own without MDM put  05/23/2022 appointment with the following noted: Received Spravato 84 mg today as scheduled.  Tolerated it well without nausea or vomiting headache or chest pain or palpitations.  Has not dropped seroquel XR 300 mg 1/2 tablet nightly bc couldn't sleep without it. Has not tried lower dose quetiapine 50 mg HS Still feels depressed.   BP is better managed so far, just saw PCP.  BP is better today and infact is low today. Dropped clonidine as directed from 0.3 mg BID bc inadequate control of BP to 0.2 mg BID.  However she wants to increase it  back to 0.3 mg twice daily because she feels it helped her anxiety better.  Wonders about increasing Wellbutrin for depression.  However she has only been on 300 mg a day for a week.  She was on 450 mg daily in the past.  06/06/22 appt noted: Received Spravato 84 mg today as scheduled.  Tolerated it well without nausea or vomiting headache or chest pain or palpitations.  She is still depressed and anxious.  She wants to try to stop the Seroquel but cannot sleep without some of it.  She is taking lorazepam 1 mg 4 times daily and still having a lot of anxiety.  She wants to increase clonidine back to 0.3 mg twice daily.  She hopes for more improvement She recently went for a second psychiatric opinion as suggested the results of that are pending.  06/11/22 appt noted: Received Spravato 84 mg today as scheduled.  Tolerated it well without nausea or vomiting headache or chest pain or palpitations.  She is still depressed and anxious. Without much change.  Still hopeless, anhedonia, reduced inteterest and motivation.  Tolerating meds. Disc concerns Spravato is not hleping much. Plan: stop Seroquel and start olanzapine 10 mg HS for TRD and anxiety.  06/13/2022 appointment noted: Received Spravato 84 mg today as scheduled.  Tolerated it well without nausea or vomiting headache or chest pain or palpitations.  She is still depressed and anxious. Without much change.  Still hopeless, anhedonia, reduced inteterest and motivation.  Tolerating meds. Disc concerns Spravato is not helping much as hoped but is improving a bit in the  last week. Tolerating meds. Continues Wellbutrin XL 450 AM, tolerating recently started olanzapine  10 mg HS. Sleep is good.   Pending appt with [place] consult.  06/18/22 appt noted: Received Spravato 84 mg today as scheduled.  Tolerated it well without nausea or vomiting headache or chest pain or palpitations.  Tolerating meds. Continues Wellbutrin XL 450 AM, tolerating recently started  olanzapine  10 mg HS. Continues Adderall XR 20 amd and has tried to reduce lorazepam to 46m TID Sleep is good.   [facility] [facility] [facility] Depression is a little bit better in the last week with a little improvement in emotional expression and interest.  She is pushing herself to be more active.  Her daughter thought she was a little better than she has been.  However she is still depressed and still not her normal self with anhedonia and reduced emotional expressiveness.  06/20/22 appt noted: Received Spravato 84 mg today as scheduled.  Tolerated it well without nausea or vomiting headache or chest pain or palpitations.  Tolerating meds with a little sleepiness. Continues Wellbutrin XL 450 AM, tolerating recently started olanzapine  10 mg HS. Continues Adderall XR 20 amd and has tried to reduce lorazepam to 165mTID Sleep is good.   Mood is improving.  Better funciton.  Anxiety is better with olanzapine. Still not herself and depression not gone with some anhedonia and social avoidance and feeling overwhelmed.  8/14 2023 received Spravato 84 mg 06/27/2022 received Spravato Spravato 84 mg 07/02/2022 received Spravato 84 mg 07/04/2022 received Spravato 84 mg  07/09/2022 appointment noted: Received Spravato 84 mg today as scheduled.  Tolerated it well without nausea or vomiting headache or chest pain or palpitations.  Expected dissociation and feels less depressed with resolution of negative emotions immediately after Spravato and then depression, anxiety creep back in. Continues meds Adderall XR 20 mg every morning, Wellbutrin XL 450 every morning, clonidine 0.1 mg twice daily, lorazepam 1 mg every 6 hours as needed, olanzapine increased from 7.5 to 10 mg nightly on  Tolerating meds.  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  She does note some increased appetite and is  somewhat concerned about that but has not gained significant amounts of weight. [place] which was initially [place] but she knows it can be appealed.  However because she is improving with Spravato plus the other medications now she wants to continue the current treatment plan.  07/18/22 appt noted: Continues meds Adderall XR 20 mg every morning, Wellbutrin XL 450 every morning, clonidine 0.1 mg twice daily, lorazepam 1 mg every 6 hours as needed, olanzapine increased from 7.5 to 10 mg nightly on 07/04/2022. Received Spravato 84 mg today as scheduled.  Tolerated it well without nausea or vomiting headache or chest pain or palpitations.  Expected dissociation and feels less depressed with resolution of negative emotions immediately after Spravato and then depression, anxiety creep back in. Continues meds Adderall XR 20 mg every morning, Wellbutrin XL 450 every morning, clonidine 0.1 mg twice daily, lorazepam 1 mg every 6 hours as needed, olanzapine increased from 7.5 to 10 mg nightly on  Tolerating meds.  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  She does note some increased appetite and is somewhat concerned about that  but has not gained significant amounts of weight. She has had the [place] consultation which was initially denied but she knows it can be appealed. She continues to have chronic ambivalence about psychiatric medicines and initially tends to blame her depressive symptoms such as decreased concentration and feeling flat on what ever medicine she currently is taking even though she had the same symptoms before the current medicines were started.  Then after discussion she does admit that her depressive symptoms are improved since adding olanzapine but still has those residual symptoms noted.  07/23/22 received Spravato 84 mg   07/30/2022 appointment  noted: Received Spravato 84 mg today as scheduled.  Tolerated it well without nausea or vomiting headache or chest pain or palpitations.  Expected dissociation and feels less depressed with resolution of negative emotions immediately after Spravato and then depression, anxiety creep back in. Continues meds Adderall XR 20 mg every morning, Wellbutrin XL 450 every morning, clonidine 0.1 mg twice daily, lorazepam 1 mg every 6 hours as needed, olanzapine increased from 7.5 to 10 mg nightly on  She has been inconsistent with olanzapine because she continues to be ambivalent about the medications in general and thinks that perhaps the 10 mg is making her feel blunted.  She continues to feel some depression.  She had a good day this week and but still feels somewhat depressed and persistently anxious. Plan: be consistent with olanzapine 10 mg HS for TRD and longer trial for potential benefit for anxiety.  Has not taken it consistently.  08/06/2022 appointment noted: Received Spravato 84 mg today as scheduled.  Tolerated it well without nausea or vomiting headache or chest pain or palpitations.  Expected dissociation and feels less depressed with resolution of negative emotions immediately after Spravato and then depression, anxiety creep back in. Continues meds Adderall XR 20 mg every morning, Wellbutrin XL 450 every morning, clonidine 0.1 mg twice daily, lorazepam 1 mg every 6 hours as needed, olanzapine increased from 7.5 to 10 mg nightly  She continues to feel depressed but is about 50% better with Spravato.  She is still not herself.  She still has anhedonia.  She still is not her able to engage socially in the typical ways.  She is not jovial and outgoing like normal.  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  She continues to feel that her personality is dampened down.  There is a question  about whether it is related to depression or medication.  ECT-MADRS    Flowsheet Row Clinical Support from 08/06/2022 in [place] from 07/04/2022 in [place] from 05/21/2022 in [place] Visit from 03/02/2022 in [facility]  MADRS Total Score _0 46        Past Psychiatric Medication Trials: fluoxetine, duloxetine, Viibryd, lamotrigine, Pristiq, sertraline, citalopram,  Trintellix anxious and SI Wellbutrin XL 450  Adderall, Adderall XR, Vyvanse, Ritalin, Strattera low dose NR Lorazepam Trazodone  Depakote,  lamotrigine cog complaints Lithium remotely Abilify 7.5  Vraylar 1.5 mg daily agitation and insomnia Rexulti insomnia Latuda 40 one dose, CO anxious and SI Seroquel XR 300 Olanzapine 10  At visit November 12, 2019. We discussed Patient developed an increasingly severe alcohol dependence problem since her last visit in January.  She went to [facility] and has had no alcohol since then except 1  day.  She never abused stimulants but they took her off the stimulants at [facility].  Her ADD was markedly worse.  The Wellbutrin did not help the ADD.   D history lamotrigine rash at 66 yo  Review of Systems:  Review of Systems  Constitutional:  Positive for fatigue.  Cardiovascular:  Negative for palpitations.  Musculoskeletal:  Positive for arthralgias and back pain. Negative for joint swelling.       SP hip surgery October 2020  Neurological:  Negative for dizziness and tremors.  Psychiatric/Behavioral:  Positive for decreased concentration and dysphoric mood. Negative for agitation, behavioral problems, confusion, hallucinations, self-injury, sleep disturbance and suicidal ideas. The patient is nervous/anxious. The patient is not hyperactive.     Medications: I have reviewed the patient's current medications.  Current Outpatient Medications   Medication Sig Dispense Refill   amLODipine (NORVASC) 2.5 MG tablet Take 2.5 mg by mouth daily.     amphetamine-dextroamphetamine (ADDERALL XR) 20 MG 24 hr capsule Take 1 capsule (20 mg total) by mouth every morning. 30 capsule 0   amphetamine-dextroamphetamine (ADDERALL XR) 20 MG 24 hr capsule Take 1 capsule (20 mg total) by mouth every morning. 30 capsule 0   buPROPion (WELLBUTRIN XL) 150 MG 24 hr tablet TAKE 3 TABLETS BY MOUTH DAILY 270 tablet 1   cloNIDine (CATAPRES) 0.2 MG tablet TAKE 1 TABLET BY MOUTH 2 TIMES DAILY. 60 tablet 1   iron polysaccharides (NIFEREX) 150 MG capsule TAKE 1 CAPSULE BY MOUTH EVERY DAY 90 capsule 1   LORazepam (ATIVAN) 1 MG tablet Take 1 tablet (1 mg total) by mouth every 8 (eight) hours as needed. for anxiety 90 tablet 0   losartan (COZAAR) 50 MG tablet Take 50 mg by mouth daily.     nebivolol (BYSTOLIC) 2.5 MG tablet Take 2.5 mg by mouth daily.     OLANZapine (ZYPREXA) 10 MG tablet TAKE 1 TABLET BY MOUTH EVERYDAY AT BEDTIME (Patient taking differently: Take 10 mg by mouth at bedtime.) 90 tablet 0   SPRAVATO, 84 MG DOSE, 28 MG/DEVICE SOPK USE 3 SPRAYS IN EACH NOSTRIL TWICE A WEEK 3 each 2   No current facility-administered medications for this visit.    Medication Side Effects: None  Allergies:  Allergies  Allergen Reactions   Metronidazole Shortness Of Breath and Other (See Comments)    Heart pounding   Ferrlecit [Na Ferric Gluc Cplx In Sucrose] Other (See Comments)    Infusion reaction 05/12/2019    Past Medical History:  Diagnosis Date   ADHD    Anemia    Anxiety    Arthritis    Depression    Heart murmur    i went to see a cardiologit slast eyar  and i had zero plaque,    PONV (postoperative nausea and vomiting)    Recovering alcoholic in remission ([place])     Family History  Problem Relation Age of Onset   Atrial fibrillation Mother    CAD Father     Social History   Socioeconomic History   Marital status: Married    Spouse name: Not  on file   Number of children: Not on file   Years of education: Not on file   Highest education level: Not on file  Occupational History   Not on file  Tobacco Use   Smoking status: Former    Types: Cigarettes    Quit date: 08/16/2003    Years since quitting: 18.9   Smokeless tobacco: Never   Tobacco  comments:     08-28-2019 "i smoked 2 cigarettes in the last month since my father  passed"  Vaping Use   Vaping Use: Never used  Substance and Sexual Activity   Alcohol use: Yes    Alcohol/week: 10.0 standard drinks of alcohol    Types: 10 Glasses of wine per week   Drug use: No   Sexual activity: Not on file  Other Topics Concern   Not on file  Social History Narrative   Not on file   Social Determinants of Health   Financial Resource Strain: Not on file  Food Insecurity: Not on file  Transportation Needs: Not on file  Physical Activity: Not on file  Stress: Not on file  Social Connections: Not on file  Intimate Partner Violence: Not on file    Past Medical History, Surgical history, Social history, and Family history were reviewed and updated as appropriate.   Please see review of systems for further details on the patient's review from today.   Objective:   Physical Exam:  There were no vitals taken for this visit.  Physical Exam Constitutional:      General: She is not in acute distress. Neurological:     Mental Status: She is alert and oriented to person, place, and time.     Coordination: Coordination normal.     Gait: Gait normal.  Psychiatric:        Attention and Perception: Attention and perception normal.        Mood and Affect: Mood is anxious and depressed. Affect is not labile, blunt, tearful or inappropriate.        Speech: Speech is not rapid and pressured or slurred.        Behavior: Behavior is not slowed.        Thought Content: Thought content is not paranoid or delusional. Thought content does not include homicidal or suicidal ideation.  Thought content does not include suicidal plan.        Cognition and Memory: Cognition normal. Memory is impaired. She does not exhibit impaired recent memory.        Judgment: Judgment normal.     Comments: Insight intact. No auditory or visual hallucinations. No delusions.  Depression clearly improved with the switch from Seroquel to olanzapine.  Anxiety is further improved as wel but chronic Affect still depressed but less blunted than it was before Spravato.  Still blunted No [place] intent plan      Lab Review:     Component Value Date/Time   NA 137 01/12/2021 1430   NA 140 11/18/2018 1544   K 3.8 01/12/2021 1430   CL 108 01/12/2021 1430   CO2 22 01/12/2021 1430   GLUCOSE 94 01/12/2021 1430   BUN 14 01/12/2021 1430   BUN 20 11/18/2018 1544   CREATININE 0.82 01/12/2021 1430   CALCIUM 8.9 01/12/2021 1430   PROT 6.6 01/12/2021 1430   ALBUMIN 3.9 01/12/2021 1430   AST 12 (L) 01/12/2021 1430   ALT 11 01/12/2021 1430   ALKPHOS 46 01/12/2021 1430   BILITOT 0.5 01/12/2021 1430   GFRNONAA >60 01/12/2021 1430   GFRAA >60 09/02/2019 0249   GFRAA >60 01/27/2019 0811       Component Value Date/Time   WBC 4.5 01/12/2021 1430   RBC 4.32 01/12/2021 1430   HGB 12.8 01/12/2021 1430   HGB 12.9 07/17/2019 0953   HCT 38.5 01/12/2021 1430   HCT 21.9 (L) 12/25/2018 1221   PLT 272 01/12/2021 1430  PLT 286 07/17/2019 0953   MCV 89.1 01/12/2021 1430   MCH 29.6 01/12/2021 1430   MCHC 33.2 01/12/2021 1430   RDW 12.4 01/12/2021 1430   LYMPHSABS 1.4 01/12/2021 1430   MONOABS 0.4 01/12/2021 1430   EOSABS 0.0 01/12/2021 1430   BASOSABS 0.0 01/12/2021 1430    No results found for: "POCLITH", "LITHIUM"   No results found for: "PHENYTOIN", "PHENOBARB", "VALPROATE", "CBMZ"   .res Assessment: Plan:    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  Have  discussed some of her recent abnormal behaviors leading to this depressive episode getting worse which she says were associated with heavy use of delta 8 and not a manic episode.  She realizes now that that was not good for her.  She stopped all use of other drugs including those available over-the-counter such as delta 8 or any other THC related products.  She is no longer having any of those types of behaviors and instead is depressed.  She remains depressed with lack of interest and lack of feeling for things that normally she would have feelings about.  She has low motivation and energy.  She is difficulty making decisions.  She is sad and down.  She is less productive than usual.  Her concentration is poor.  She has high degree of anxiety as well. She has a very negative self-image and low self-esteem.  These are all uncharacteristic for her However all of these symptoms are partially improved through Spravato and the switch from Seroquel to olanzapine.  She is approximately 40 to 50% better.  Because of the question of cognitive issues we will check TSH, B12 and folate.  Pending She has still not done these blood tests.  Blood pressure has become more normal, so she was able to receive Spravato.   Patient was administered Spravato 84 mg intranasally today.  The patient experienced the typical dissociation which gradually resolved over the 2-hour period of observation.  There were no complications.  Specifically the patient did not have nausea or vomiting or headache.  Blood pressures remained within normal ranges at the 40-minute and 2-hour follow-up intervals.  By the time the 2-hour observation period was met the patient was alert and oriented and able to exit without assistance.  Patient feels the Spravato administration is helpful for the treatment resistant depression and would  like to continue the treatment.  See nursing note for further details.  Failed multiple antidepressants.  Many of them were not actual failures but intolerances and it is unclear whether some of that was more connected with anxiety than true side effects.  1 example is the [place].  In general she does not want to try anything but an antidepressant but has failed all major categories of antidepressants except TCAs and MAO inhibitors which have not been tried.  Could consider a TCA but given her manic response to delta 8 there is some concern about the risk of a TCA triggering mania.  It could be combined with lithium but she generally is unfavorable about taking lithium.  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  She wants to continue Spravato. We discussed discussed the side effects  in detail as well as the protocol required to receive Spravato.    Be more consistent with olanzapine 10 mg nightly because of its potential benefit for treatment resistant depression and anxiety. Depression markedly improve with the switch from Seroquel to olanzapine.  Anxiety is further improved as well.   Extensive discussion again her ambivalence about meds and missatributing sx of depression as SE of meds.   She agrees to continue olanzapine 10 mg HS for another couple of weeks.  continue Wellbutrin XL to prior dose 450 mg AM  Started Spravato 84 mg twice weekly on 03/16/2022.  But has missed several doses due to elevated blood pressure prior to administration  Adderall  XR 20 mg AM  Ok temporary Ativan 1 mg 3-4 times daily as needed anxiety but try to cut it back. Is not ideal to use benzodiazepine with stimulant but because of the severity of her symptoms it has been necessary.  Hope to eventually eliminate the benzodiazepine.  Expected as her depression improves her anxiety  will improve as well.  However lately her anxiety has been unmanageable.  We will expect that to improve as the depression improves.  She has headed insturctions to reduce this.  Continue clonidine 0.2 mg BID off label for anxiety and helps BP partially. BP is better controlled.  Discussed potential benefits, risks, and side effects of stimulants with patient to include increased heart rate, palpitations, insomnia, increased anxiety, increased irritability, or decreased appetite.  Instructed patient to contact office if experiencing any significant tolerability issues. She wants to return to usual dose of Adderall for ADD bc of mor poor cognitive function with reduction.  Also discussed that depression will impair cognitive function.  Discussed safety plan at length with patient.  Advised patient to contact office with any worsening signs and symptoms.  Instructed patient to go to the [facility] emergency room for evaluation if experiencing any acute safety concerns, to include suicidal intent.  [place] consultation was initially denied but she understands is being appealed if needed.  An appeal is ongoing at this time.  However because she is improving recently she wants to continue the current treatment plan.  Her symptoms have been very severe up until present but she is beginning to see more normal affect and her family notices the improvement as well.  Extensive discussion again about her chronic ambivalence about psychiatric medicines and tendency to blame medicine for the symptoms of depression that she had prior to even starting the medications.  After discussing this at length she was able to acknowledge that that is factual.  She does not appear to be having any significant side effects with the current medications.  Has Maintained sobriety  No med change today, but be more consistent with olanzapine 10 mg HS  FU with Spravato weekly unless starts TMS then will stop it.  Lynder Parents, MD,  [facility]     Please see After Visit Summary for patient specific instructions.  No future appointments.           No orders of the defined types were placed in this encounter.      -------------------------------

## 2022-08-06 NOTE — Progress Notes (Signed)
NURSES NOTE:   Patient arrived for her 34th Spravato treatment. Pt is being treated for Treatment Resistant Depression, pt will be receiving 84 mg which will continue to be her maintenance dose, she receives weekly treatments.  Pt is also planning on [place] as soon as insurance will approve her treatments, then she will stop Spravato at that time. Dr. Clovis Pu is aware pt needs a scale completed to renew her prior authorization for spravato. Patient arrived and taken to treatment room. Confirmed she had a ride home which is her husband would be coming back to pick up pt when done and sometimes she needs to use [facility] if he is unable to pick her up. Pt's Spravato is ordered through [employer] and delivered to office, all Spravato medication is stored at doctors office per REMS/FDA guidelines. The medication is required to be locked behind two doors per FDA/REMS Protocol. Medication is also disposed of properly per regulations.      Began taking patient's vital signs at 9:35 AM 120/72, pulse 52. Instructed patient to blow her nose then recline back to a 45 degree angle. Gave patient first dose 28 mg nasal spray, each nasal spray administered in each nostril as directed and waited 5 minutes between the second and third dose. All 3 doses given pt did not complain of any nausea/vomiting, given a cup of water due to the taste after the administration of Spravato.  She listens to [employer] with spa or relaxing music.  Checked 40 minute vitals at 10:15 AM, 122/73, pulse was 62. Explained she would be monitored for a total time of 120 minutes. Discharge vitals were taken at 11:55 PM 118/72 P 65. Dr. Clovis Pu  met with her and discussed her medication and her plans for [place], Dr. Clovis Pu also needs to write a letter for her [place] to confirm she does not have bipolar. I walked pt to elevator, where her husband met her for the ride home. Recommend she go home and sleep or just relax on the couch. No driving, no intense  activities. Verbalized understanding. Nurse was with pt a total of 70 minutes for clinical. Pt is coming back in on Monday, October 2nd. Pt instructed to call office with any problems or questions.      LOT [id number]

## 2022-08-10 ENCOUNTER — Other Ambulatory Visit: Payer: Self-pay | Admitting: Psychiatry

## 2022-08-13 ENCOUNTER — Ambulatory Visit: Payer: 59

## 2022-08-13 ENCOUNTER — Encounter: Payer: Self-pay | Admitting: Psychiatry

## 2022-08-13 ENCOUNTER — Ambulatory Visit (INDEPENDENT_AMBULATORY_CARE_PROVIDER_SITE_OTHER): Payer: 59 | Admitting: Psychiatry

## 2022-08-13 ENCOUNTER — Other Ambulatory Visit: Payer: Self-pay | Admitting: Psychiatry

## 2022-08-13 VITALS — BP 124/76 | HR 75

## 2022-08-13 DIAGNOSIS — F5105 Insomnia due to other mental disorder: Secondary | ICD-10-CM

## 2022-08-13 DIAGNOSIS — F411 Generalized anxiety disorder: Secondary | ICD-10-CM

## 2022-08-13 DIAGNOSIS — F900 Attention-deficit hyperactivity disorder, predominantly inattentive type: Secondary | ICD-10-CM

## 2022-08-13 DIAGNOSIS — F339 Major depressive disorder, recurrent, unspecified: Secondary | ICD-10-CM | POA: Diagnosis not present

## 2022-08-13 MED ORDER — NORTRIPTYLINE HCL 25 MG PO CAPS: ORAL_CAPSULE | ORAL | 0 refills | Status: DC

## 2022-08-13 MED ORDER — TRAZODONE HCL 50 MG PO TABS: ORAL_TABLET | ORAL | 1 refills | Status: DC

## 2022-08-13 NOTE — Telephone Encounter (Signed)
Do you want me to keep this at twice a week on the order?

## 2022-08-13 NOTE — Progress Notes (Signed)
Laura Chang [id number] [birth year] 66 y.o.  Subjective:   Patient ID:  Laura Chang is a 66 y.o. (DOB 1956/02/14) female.  Chief Complaint:  Chief Complaint  Patient presents with  . Follow-up  . Depression  . Anxiety  . Fatigue  . ADD  . Medication Problem     HPI Laura Chang presents to the office today for follow-up of depression and anxiety and ADD.  seen November 12, 2019.  Melted down in 2020.  Went to [employer] in July.  No withdrawal.  1 drink since then.  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 She was on no stimulant and no SSRI but was taking Strattera and Wellbutrin.  The following changes were made. Stop Strattera. OK restart stimulant bc severe ADD Restart Adderall 1 daily for a few days and if tolerated then restart 1 twice daily. If not tolerated reduce the dosage if needed. May need to stop Wellbutrin if not tolerating the stimulant.  Yes.  DC Wellbutrin Restart Prozac 20 mg daily.  February 2021 appointment with the following noted: Completed grant proposal.  Couldn't doit without Adderall.  Sold a bunch of work.   Adderall XR lasts about 3 pm.  Strength seems about right.  BP been OK.  Not jittery.   Stopped Wellbutrin but had no SE. Mood drastically better with grant proposal and back on fluoxetine.  Less depressed and lethargic.  No anxiety.  Cut back on coffee. Started back with devotions and stronger faith. Plan: Continue Prozac 20 mg daily. May have to increase the dose at some point in the future given that she usually was taking higher dosages but she is getting good response at this time. Restart Wellbutrin off label for ADD since can't get 2 ADDERALL daily. 150 mg daily then 300 mg daily. She can adjust the dose between 150 mg and 300 mg daily to get the optimal effect.   05/11/2020 appointment with the following noted: Has been inconsistent with Prozac and Wellbutrin. Not sure of the effect of  Wellbutrin. Biggest deterrent in work is anxiety.  Some of the work is conceptual and difficult at times.  Can feel she's not up to a project at times.  Overall is OK but would like a steadier benefit from stimulant.  Exhausted from managing concentration and keeping up with things from the day.  Loses things.  Not good keeping up with schedule. Overall productive and emotionally OK. Can feel Adderall wear off. Mood is better in summer and worse in the winter.   F died in 2023/09/28 and that is a loss. No SE Wellbutrin. Still attends AA meetings.  Real benefit from [employer] last year. Recognizes effect of anemia on ADD and mood.  Had iron infusions last winter. Plan:  Wellbutrin off label for ADD since can't get 2 ADDERALL daily. 150 mg daily then 300 mg daily.  01/24/2021 appointment with following noted: Doing a program called [employer] mindfulness app since Xmas.  CBT app helped the depression.  App helped her focus better.  Lost sign weight. Writing a lot. Before Xmas felt depressed and started negative thinking worse, self denigrating. Not drinking. More isolated.   Recognizes mo is narcissist.    Didn't tell anyone she was born until 3 mos later.  M aloof and uninterested in pt.  Lied about her birthday.  Mo lack of affection even with pt's kids. Going to [place] for a year and it helped her to  quit drinking. Also misses kids being gone with a hole also. Plan: No med changes  05/04/2021 appointment with the following noted: Therapist [name] thinks she's manic. Lost weight to 144#.   States she is still sleeping okay.  Admits she is hyper and recognizes that she is likely manic.  She feels great, euphoric with an increased sense of spiritual connectedness to God.  She has racing thoughts and talks fast and talks a lot and this is noted by her husband.  He thinks she is a bit hyper.  She has been able to maintain sobriety although she will have 1 glass of wine on special occasions but  does not drink by herself.  She is not drinking to excess.  She denies any dangerous impulsivity.  She is clearly not depressed and not particularly anxious.  She has no concerns about her medication and she has been compliant.  06/16/21 appt noted: So much better.  Going through a lot but the manic thing happened on top of it.  So much slower.  Didn't feel like losing anything with risperidone.  Likes the Adderalll at 10 mg. Some drowsiness in the AM and very drowsy from risperidone 2 mg HS. Prayer life is better. Handling stress better. Less depressed with risperidone. Still likes trazodone. Sleeps well. Plan: Reduce Prozac to 10 mg daily.  Consider stopping it because it can feel the mania however she is reluctant to do that because she fears relapse of depression. Reduce risperidone to 1.5 mg nightly due to side effects.  Discussed risk of worsening mania.  07/25/2021 appointment with the following noted: Misses the Adderall and hard to function without it. Depressed now. Heavy chest.  Anxious and guilty.  Body feels heavy.   Hates Wellbutrin.   Plan: Increase fluoxetine to 20 mg daily Add Abilify 1/2 of 15 mg tablet daily Wean wellbutrin by 1 tablet each week  bc she feels it is not helpful and DT polypharmacy Reduce risperidone to 1 daily for 1 week and stop it. Disc risk of mania. Increase Adderall to XR 20 mg AM  08/08/21 Much less depressed and starting to feel normal I feel a lot better. No SE.  Speech normal off risperidone. Sleeping OK on trazaodone and enough.   Noticed benefit from Adderall again. Plan: continue fluoxetine to 20 mg daily Continue Abilify 1/2 of 15 mg tablet daily for depression and mania continue Adderall to XR 20 mg AM  10/10/2021 phone call: Pt stated she feels like the Abilify should be decreased to 37m.She said she is depressed but rational and not suicidal.She has an appt Monday and can wait until then if you prefer. MD response: Reduce the Abilify to  7.5 mg every other day.  We will meet on 10/16/2021 and decide what to do from there.  10/16/2021 appointment with the following noted: More depressed.  Most depressed I've ever been.  Just numb.  Sense of grief.   Thinks the manic episode was unlike anything else she ever had.  Doesn't want to medicate against it.  Don't enjoy people.  Easily overwhelmed.  Had some death thoughts but not suicidal.  Has been functional.  Feels better today after reducing Abilify to every other day but she is only been doing that for 3 days. A/P: Episode of post manic depression was explained. continue fluoxetine to 20 mg daily Hold Abilify for 1 week then resume Abilify 1/2 of 15 mg tablet every other day for depression and mania continue Adderall  to XR 20 mg AM  10/27/2021 appointment with the following noted: I'm doing so much better.  Handling the depression better. Better self talk and spiritual focus has helped.   Dep 6/10 manifesting as anxiety with low confidence.   F died 2  years ago and M 66 yo and is dependent . She is working hard to feel better but still feels depressed.  She almost feels like she has a little more anxiety since restarting Abilify every other day. Plan: continue fluoxetine to 20 mg daily DC Abilify .  Vrayalar 1.5 mg QOD to try to get rid of depression ASAP. continue Adderall to XR 20 mg AM  11/10/2021 appointment with the following noted: Busy with Xmas and it was fun with family but then a big let down.  Did well with it.  Functioned well with it.  Working hard on things with depression.  Not shutting down. Not sure but feels better today but yesterday was hard.  Difficulty dealing with mother.  She won't do anything to help herself.  Yesterday with her all day.  Won't do PT and has isolated herself.    Lack of confidence.   No SE with Vraylar.  11/24/21 urgent appointment appt noted: More and more depressed.   So anxious and doesn't want to be alone but can do so. No  appetite. Hurts inside. Has had some fleeting suicidal thoughts but would not act on them.  Tolerating meds. Has been consistent with Vraylar 1.5 mg every other day, fluoxetine 20 mg daily Plan: Increase Vraylar to 1.5 mg daily Change Prozac to Trintellix 10 mg daily. Discussed side effects of each continue Adderall to XR 20 mg AM  12/27/2021 appointment with the following noted: Not OK.  I feel less depressed but feels bat shit. Not sleeping well.  Extremely anxious. Off and on sleep. 3-4 hours of sleep.   Still having daily SI.  But also become obvious has so much to do.  Overwhelmed by tasks.   Needs anxiety meds to just function. Not more motivated.  Walked yesterday.   Feels afraid like in trouble but not irritable or angry. DC DT agitation Vraylar to 1.5 mg daily Change Prozac to Trintellix 10 mg daily. Hold Adderall to XR 20 mg AM Clonidine 0.1 1/2 tablet twice daily for 2 days and if needed for anxiety and sleep increase to 1 twice daily Ok temporary Ativan 1 mg 3 times daily as needed anxiety  01/05/22 appt noted: Off fluoxetine and  Trintellix.  Only on Ativan, trazodone and Adderall XR 20 plus added clonidine 0.1 mg BID Didn't think she needed to start Trintellix. Not taking Ativan.   Didn't like herself last week. Feels some better today. Wonders if the manic sx Not agitated.  Anxiety kind of calmed down.  A lot to be anxious about situationally.  $ stress. Concerns about downers with meds. Can't access normal personality. ? Lethargy and inability to talk as sE. Plan: Latuda 20-40 mg daily with food. Adderall to XR 20 mg AM Clonidine 0.1 1/2 tablet twice daily  reduce dose to be sure no SE Ok temporary Ativan 1 mg 3 times daily as needed anxiety  01/19/22 appt noted: Taking Latuda 20 mg daily.  Took 40 mg once and felt anxious and  SI Still depressed and not very reactive Anxiety mainly about the depression and fears of the future. She wants to revisit manic sx and  thinks it was maybe bc taking delta 8 bc was  taking a lot of it so still doesn't think she's classic bipolar. She wants to only take Prozac bc thinks Latuda is perpetuating depression. Says the delta 8 was very psychaedelic.  When not taking it was not manic.  Sleeping ok again.  Plan: Per her request DC Latuda 20-40 mg daily with food. She wants to continue Prozac alone AMA  Adderall to XR 20 mg AM Clonidine 0.1 1/2 tablet twice daily  reduce dose to be sure no SE Ok temporary Ativan 1 mg 3 times daily as needed anxiety  01/23/2022 phone call complaining of increased anxiety since stopping Latuda.  She will try increasing clonidine.  01/26/2022 phone call not feeling well and wanted to restart the Vraylar.  However notes indicate that had made her agitated therefore she was encouraged to pick up samples of Rexulti 1 mg and start that instead.  02/06/2022 phone call: Stating she felt the Rexulti was helping with depression but she was not sleeping well and obsessing over things.  She was encouraged to increase Rexulti to 2 mg daily and increase trazodone for sleep.  02/09/2022 appointment with the following noted: This was an urgent work in appointment No sleep last night with trazodone 100 mg HS Nothing really better depression or anxiety. Ruminating negative anxious thoughts. Did not tolerate Rexulti because it was causing insomnia.  Does not think it helped depression.  Lacks emotion that she should have.  Lacks her usual personality.  Some hopeless thoughts.  Some death thoughts.  Some suicidal thoughts without plan or intent Plan: DC Rexulti and Prozac & DC trazodone Adderall to XR 20 mg AM Clonidine 0.1 1/tablet twice daily  reduce dose to be sure no SE Ok temporary Ativan 1 mg 3 times daily as needed anxiety Start Seroquel XR 150 mg nightly  03/02/2022 appointment: Laura Chang called back a few days after starting Seroquel stating it was making her more anxious and more depressed.  This  seemed unlikely as this medicine rarely ever causes anxiety.  She stopped the medication waited 3 days and called back still had anxiety and depression but thought perhaps the anxiety was a little better.  She did not want to take the Seroquel. She knew about the option of Spravato and wanted to pursue that. Now questions whether to return to Seroquel while waiting to start Spravato bc feels just as bad without it and knows she didn't give it enough time to work.   MADRS 46  ECT-MADRS    Flowsheet Row Clinical Support from 08/06/2022 in [place] from 07/04/2022 in [place] from 05/21/2022 in [place] Visit from 03/02/2022 in [facility]  MADRS Total Score _0 46      03/14/22 appt noted: Pt received Spravato 56 mg first dose today with some dissociative sx which were not severe.  She was anxious prior to the administration and felt better after receiving lorazepam 1 mg.  No NV, or HA. Wants to continue Spravato. Ongoing depression and desperate to feel better.  I'm not myself DT deprsssion which is most severe in recent history.  Anhedonia.  Low motivation.  Social avoidance. Continues to think all recent med trials are making her worse.  Sleep ok with Seroquel.  03/16/22 appt noted: Received Spravato 84 mg for the first time.  some dissociative sx which were not severe.  She was anxious prior to the administration and felt better after receiving lorazepam 1 mg.  No  NV, or HA. Wants to continue Spravato.   Does not feel any better or different since the last appt.  Ongoing depression.  Ongoing depression and desperate to feel better.  I'm not myself DT deprsssion which is most severe in recent history.  Anhedonia.  Low motivation.  Social avoidance. Continues to think all recent med trials are making her worse.  Sleep ok with Seroquel.  Does not want to continue Seroquel for  TRD.  03/20/2022 appointment noted: Came for Spravato administration today.  However blood pressure was significantly elevated approximately 180/115.  She was given lorazepam 1 mg and clonidine 0.2 mg to try to get it down. She states she regretted stopping the Seroquel XR 300 mg tablets.  She now realizes it was helpful.  She did not sleep much at all last night.  She did not take the Adderall this morning. 2 to 3 hours after arrival blood pressure was still elevated at  170/110, 62 pulse.  For Spravato administration was canceled for today.  She admits to being anxious and depressed.  She is not suicidal.  She is highly motivated to receive the Spravato.  We discussed getting it tomorrow.  03/22/2022 appointment noted: Patient's blood pressure was never stable enough yesterday in order to get her in for Spravato administration.  She was encouraged to see her primary care doctor.  It is better today.  03/26/2022 appointment with the following noted: Blood pressure was better.  Saw her primary care doctor who started on oral Bystolic 2.5 mg daily. Received Spravato 84 mg today as scheduled.  Tolerated it well without nausea or vomiting headache or chest pain or palpitations.  Her blood pressure was borderline but manageable. She remains depressed and anxious.  She is ambivalent about the medicine and desperate to get to feel better.  Continues to have anhedonia and low energy and low motivation and reduced ability to do things.  Less social.  Not suicidal.  03/28/22 appt noted: Received Spravato 84 mg today as scheduled.  Tolerated it well without nausea or vomiting headache or chest pain or palpitations.  Her blood pressure was borderline but manageable. Has not seen any improvement so far.  Tolerating Seroquel.  Inconsistent with Bystolic and BP has been borderline high. Still depressed and anxious and anhedonia.  Low motivation, energy, productivity. Taking quetiapine and tolerating XR 300 mg  nightly.  04/04/22 appt noted: Received Spravato 84 mg today as scheduled.  Tolerated it well without nausea or vomiting headache or chest pain or palpitations.  Her blood pressure was borderline but manageable. Has not seen any improvement so far.  Tolerating Seroquel.   She still tends to think that the medications are making her worse.  She has said this about each of the recent psychiatric medicines including Seroquel.  However her husband thinks she is improved.  She also admits there is some improvement in productivity.  She still feels highly anxious.  She still does not enjoy things as normal.  She still feels desperate to improve as soon as possible. Has been taking Seroquel XR since 03/20/2022  04/10/22 appt noted: Received Spravato 84 mg today as scheduled.  Tolerated it well without nausea or vomiting headache or chest pain or palpitations.  Her blood pressure was borderline but manageable. Has not seen any improvement so far.  Tolerating Seroquel.  Doesn't like Seroquel bc she thinks it flattens here. Ongoing depression without confidence Plan: Start Auvelity 1 every morning for persistent treatment resistant depression  04/12/2022 appointment  with the following noted: Received Spravato 84 mg today as scheduled.  Tolerated it well without nausea or vomiting headache or chest pain or palpitations.  Her blood pressure was borderline but manageable. Has not seen any improvement so far.  Tolerating Seroquel.  Doesn't like Seroquel bc she thinks it flattens her. Received Spravato 84 mg today as scheduled.  Tolerated it well without nausea or vomiting headache or chest pain or palpitations.  Her blood pressure was borderline but manageable. Has not seen any improvement so far.  Tolerating Seroquel.  Doesn't like Seroquel bc she thinks it flattens here.  We discussed her ambivalence about it. She is starting Auvelity and has tolerated it the last 2 days without side effect.  She still does not  feel like herself and feels flat and not enjoying things with suppressed expressed emotion  04/17/2022 appointment with the following noted: Received Spravato 84 mg today as scheduled.  Tolerated it well without nausea or vomiting headache or chest pain or palpitations.  Her blood pressure was borderline but manageable. Has not seen any improvement so far.  Tolerating Seroquel.  Doesn't like Seroquel bc she thinks it flattens her. She has been tolerating the Auvelity 1 in the morning without side effects for about a week.  She has not noticed significant improvement so far.  She still feels depressed and flat and not herself.  Other people notice that she is flat emotionally.  She is not suicidal.  She does feel discouraged that she is not getting better yet.  04/19/2022 appointment noted: Has increased Auvelity to 1 twice daily for 2 days, continues quetiapine XR 300 mg nightly, clonidine 0.3 mg twice daily, lorazepam 1 mg twice daily for anxiety and Adderall XR 20 mg in the morning. No obious SE but she still thinks quetiapine XR is making her feel down.  But not sedated Received Spravato 84 mg today as scheduled.  Tolerated it well without nausea or vomiting headache or chest pain or palpitations.  Her blood pressure was borderline but manageable. She still feels quite anxious and feels it necessary to take both the clonidine and lorazepam twice a day to manage her anxiety.  She has been consistently down and flat and not herself until yesterday afternoon she noted an improvement in mood and feeling much more like herself with her normal personality reemerging.  She was quite depressed in the morning with very dark negative thoughts.  She did not have those dark negative thoughts this morning.  She had a lot of questions about medication and when she was expecting to be improved and why she has not shown improvement up to now.  04/23/22 appt noted: Has increased Auvelity to 1 twice daily for 1 week,  continues quetiapine XR 300 mg nightly, clonidine 0.3 mg twice daily, lorazepam 1 mg twice daily for anxiety and Adderall XR 20 mg in the morning. No obious SE but she still thinks quetiapine XR is making her feel down.  But not sedated Received Spravato 84 mg today as scheduled.  Tolerated it well without nausea or vomiting headache or chest pain or palpitations.  She is still depressed but admits better function and is able to enjoy social interactions. Tolerating meds.  Would like to feel better for sure. Not herself.  Flat. Plan increase Auvelity to 1 tab BID as planned and reduce Quetiapine to 1/2 of ER 300 mg  bc NR for depression.  04/25/2022 appointment with the following noted: clonidine 0.3 mg twice daily, lorazepam  1 mg twice daily for anxiety and Adderall XR 20 mg in the morning. Seroquel XR 300 HS No obious SE but she still thinks quetiapine XR is making her feel down.  But not sedated Received Spravato 84 mg today as scheduled.  Tolerated it well without nausea or vomiting headache or chest pain or palpitations.  Called yesterday with more anxiety.  Had increased Auvelity for 1 day and reduced Seroquel XR for 1 day.  Felt restless and fearful  05/01/2022 appointment noted: clonidine 0.3 mg twice daily, lorazepam 1 mg twice daily for anxiety and Adderall XR 20 mg in the morning. Seroquel XR 150 HS, Auvelity 1 BID Received Spravato 84 mg today as scheduled.  Tolerated it well without nausea or vomiting headache or chest pain or palpitations.  Nurse has noted patient has called multiple times sometimes asking the same question repeatedly.  It is unclear whether she is truly forgetful or is just anxious seeking reassurance. Patient acknowledges ongoing depression as well as some anxiety but states she has felt a little better in the last couple of days.  She has reduced the Seroquel to 150 mg at night and has increased Auvelity to 1 twice daily but only for 1 day.  So far she seems to be  tolerating it.  05/03/22 appt noted: clonidine 0.2 mg twice daily, lorazepam 1 mg twice daily for anxiety and Adderall XR 20 mg in the morning. Seroquel XR 150 HS, Auvelity 1 BID BP high this am about 170/100 and received extra clonidine 0.2 mg and came to receive Spravato.  Not dizzy, no SOB, nor CP but BP is still high Could not receive Spravato today bc BP high and pulse low at 30 ppm. Still depressed and anxious. Plan: continue trial Auvelity with Spravato She needs to get BP and pulse managed  05/08/22 TC: RTC  H Michael NA and mailbox full.  Could not leave message.  Pt  -  talked to she and H on speaker. H worried over wife.  Vacant stare.  Slurs words at times.  Not smiling. Reduced enjoyment.  Depression.  Withdrawn from usual activities.  Some irritability.  Anxious. Disc her concerns meds are making her worse.  Extensive discussion about her treatment resistant status.  There is a consistent pattern of not taking the medicines long enough to get benefit because she believes the meds are making her worse.  However the symptoms she describes as side effects are exactly the same symptoms that she had prior to taking the medication RX for  the depression.  So it is not clear that these are actual side effects. This is true about the 2 most recent meds including Seroquel and Auvelity.  Recommend psychiatric consultation in hopes of improving her comfort level with taking prescribed medications for a sufficient length of time to provide benefit. Extensive discussion about ECT is the treatment of choice for treatment resistant depression.  Spravato may work if she can comply with consistency.  There are medication options but they take longer to work.   Plan:  Reduce clonidine to 0.1 mg BID DT bradycardia.  Talk with PCP about BP and low pulse problems which are interfering with her consistent compliance with Spravato.   Limit lorazepam to 3 -4  mg daily max. Excess use is the cause of slurring  speech.  She must stop excess use or will have to stop the med. Stop Auvelity per her request.  But she has only been on the full dose  for a little over a week and clearly has not had time to get benefit from it.  She thinks maybe it is making her more anxious. Reduce Seroquel from 150XR to 50 -100 mg at night IR.  She couldn't sleep when stopped it completely. Will not start new antidepressant until her SE issues are resolved or not. Get second psych opinion from Yehuda Budd MD or another psychiatrist.  H's sister is therapist in [place] [place], [facility], [facility]  05/16/2022 appointment with the following noted: Received Spravato 84 mg today as scheduled.  Tolerated it well without nausea or vomiting headache or chest pain or palpitations.  She stopped Auvelity as discussed last week. On her own, without physician input, she restarted Wellbutrin XL 450 mg every morning today.  She had taken it in the past.  She feels jittery and anxious. She feels less depressed than she did last week.  But she is still depressed without her usual range of affect.  She still is less social and less motivated than normal. Her primary care doctor increased the dose of losartan Plan: Stop Seroquel Reduce Wellbutrin XL to 300 mg every morning.  Starting the dose at 450 every morning is likely causing side effects of jitteriness and it should not be started at that have a dosage. Recommend she not change meds on her own without MDM put  05/23/2022 appointment with the following noted: Received Spravato 84 mg today as scheduled.  Tolerated it well without nausea or vomiting headache or chest pain or palpitations.  Has not dropped seroquel XR 300 mg 1/2 tablet nightly bc couldn't sleep without it. Has not tried lower dose quetiapine 50 mg HS Still feels depressed.   BP is better managed so far, just saw PCP.  BP is better today and infact is low today. Dropped clonidine as directed from 0.3 mg BID bc inadequate control  of BP to 0.2 mg BID.  However she wants to increase it back to 0.3 mg twice daily because she feels it helped her anxiety better.  Wonders about increasing Wellbutrin for depression.  However she has only been on 300 mg a day for a week.  She was on 450 mg daily in the past.  06/06/22 appt noted: Received Spravato 84 mg today as scheduled.  Tolerated it well without nausea or vomiting headache or chest pain or palpitations.  She is still depressed and anxious.  She wants to try to stop the Seroquel but cannot sleep without some of it.  She is taking lorazepam 1 mg 4 times daily and still having a lot of anxiety.  She wants to increase clonidine back to 0.3 mg twice daily.  She hopes for more improvement She recently went for a second psychiatric opinion as suggested the results of that are pending.  06/11/22 appt noted: Received Spravato 84 mg today as scheduled.  Tolerated it well without nausea or vomiting headache or chest pain or palpitations.  She is still depressed and anxious. Without much change.  Still hopeless, anhedonia, reduced inteterest and motivation.  Tolerating meds. Disc concerns Spravato is not hleping much. Plan: stop Seroquel and start olanzapine 10 mg HS for TRD and anxiety.  06/13/2022 appointment noted: Received Spravato 84 mg today as scheduled.  Tolerated it well without nausea or vomiting headache or chest pain or palpitations.  She is still depressed and anxious. Without much change.  Still hopeless, anhedonia, reduced inteterest and motivation.  Tolerating meds. Disc concerns Spravato is not helping much  as hoped but is improving a bit in the last week. Tolerating meds. Continues Wellbutrin XL 450 AM, tolerating recently started olanzapine  10 mg HS. Sleep is good.   Pending appt with [place] consult.  06/18/22 appt noted: Received Spravato 84 mg today as scheduled.  Tolerated it well without nausea or vomiting headache or chest pain or palpitations.  Tolerating  meds. Continues Wellbutrin XL 450 AM, tolerating recently started olanzapine  10 mg HS. Continues Adderall XR 20 amd and has tried to reduce lorazepam to 55m TID Sleep is good.   P[place] [facility] [facility]. Depression is a little bit better in the last week with a little improvement in emotional expression and interest.  She is pushing herself to be more active.  Her daughter thought she was a little better than she has been.  However she is still depressed and still not her normal self with anhedonia and reduced emotional expressiveness.  06/20/22 appt noted: Received Spravato 84 mg today as scheduled.  Tolerated it well without nausea or vomiting headache or chest pain or palpitations.  Tolerating meds with a little sleepiness. Continues Wellbutrin XL 450 AM, tolerating recently started olanzapine  10 mg HS. Continues Adderall XR 20 amd and has tried to reduce lorazepam to 115mTID Sleep is good.   Mood is improving.  Better funciton.  Anxiety is better with olanzapine. Still not herself and depression not gone with some anhedonia and social avoidance and feeling overwhelmed.  8/14 2023 received Spravato 84 mg 06/27/2022 received Spravato Spravato 84 mg 07/02/2022 received Spravato 84 mg 07/04/2022 received Spravato 84 mg  07/09/2022 appointment noted: Received Spravato 84 mg today as scheduled.  Tolerated it well without nausea or vomiting headache or chest pain or palpitations.  Expected dissociation and feels less depressed with resolution of negative emotions immediately after Spravato and then depression, anxiety creep back in. Continues meds Adderall XR 20 mg every morning, Wellbutrin XL 450 every morning, clonidine 0.1 mg twice daily, lorazepam 1 mg every 6 hours as needed, olanzapine increased from 7.5 to 10 mg nightly on  Tolerating meds.  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  She does note some increased appetite and is somewhat concerned about that but has not gained significant amounts of weight. She has had the [facility] which was initially denied but she knows it can be appealed.  However because she is improving with Spravato plus the other medications now she wants to continue the current treatment plan.  07/18/22 appt noted: Continues meds Adderall XR 20 mg every morning, Wellbutrin XL 450 every morning, clonidine 0.1 mg twice daily, lorazepam 1 mg every 6 hours as needed, olanzapine increased from 7.5 to 10 mg nightly on 07/04/2022. Received Spravato 84 mg today as scheduled.  Tolerated it well without nausea or vomiting headache or chest pain or palpitations.  Expected dissociation and feels less depressed with resolution of negative emotions immediately after Spravato and then depression, anxiety creep back in. Continues meds Adderall XR 20 mg every morning, Wellbutrin XL 450 every morning, clonidine 0.1 mg twice daily, lorazepam 1 mg every 6 hours as needed, olanzapine increased from 7.5 to 10 mg nightly on  Tolerating meds.  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  She does note  some increased appetite and is somewhat concerned about that but has not gained significant amounts of weight. She has had the [place] consultation which was initially denied but she knows it can be appealed. She continues to have chronic ambivalence about psychiatric medicines and initially tends to blame her depressive symptoms such as decreased concentration and feeling flat on what ever medicine she currently is taking even though she had the same symptoms before the current medicines were started.  Then after discussion she does admit that her depressive symptoms are improved since adding olanzapine but still has those residual symptoms  noted.  07/23/22 received Spravato 84 mg   07/30/2022 appointment noted: Received Spravato 84 mg today as scheduled.  Tolerated it well without nausea or vomiting headache or chest pain or palpitations.  Expected dissociation and feels less depressed with resolution of negative emotions immediately after Spravato and then depression, anxiety creep back in. Continues meds Adderall XR 20 mg every morning, Wellbutrin XL 450 every morning, clonidine 0.1 mg twice daily, lorazepam 1 mg every 6 hours as needed, olanzapine increased from 7.5 to 10 mg nightly on  She has been inconsistent with olanzapine because she continues to be ambivalent about the medications in general and thinks that perhaps the 10 mg is making her feel blunted.  She continues to feel some depression.  She had a good day this week and but still feels somewhat depressed and persistently anxious. Plan: be consistent with olanzapine 10 mg HS for TRD and longer trial for potential benefit for anxiety.  Has not taken it consistently.  08/06/2022 appointment noted: Received Spravato 84 mg today as scheduled.  Tolerated it well without nausea or vomiting headache or chest pain or palpitations.  Expected dissociation and feels less depressed with resolution of negative emotions immediately after Spravato and then depression, anxiety creep back in. Continues meds Adderall XR 20 mg every morning, Wellbutrin XL 450 every morning, clonidine 0.1 mg twice daily, lorazepam 1 mg every 6 hours as needed, olanzapine i 10 mg nightly  She continues to feel depressed but is about 50% better with Spravato.  She is still not herself.  She still has anhedonia.  She still is not her able to engage socially in the typical ways.  She is not jovial and outgoing like normal.  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  She continues to feel that her  personality is dampened down.  There is a question about whether it is related to depression or medication. Plan: continue olanzapine 10 for longer trial for TRD and severe anxiety.  08/13/22 appt noted:  Received Spravato 84 mg today as scheduled.  Tolerated it well without nausea or vomiting headache or chest pain or palpitations.  Expected dissociation and feels less depressed with resolution of negative emotions immediately after Spravato and then depression, anxiety creep back in. Continues meds Adderall XR 20 mg every morning, Wellbutrin XL 450 every morning, clonidine 0.1 mg twice daily, lorazepam 1 mg every 6 hours as needed, olanzapine i 10 mg nightly  She still does not feel herself.  Still struggles with depression and low motivation and reduced social engagement and reduced interest and reduced emotional expression.  She is somewhat better with the medicines plus Spravato.  She still believes the Spravato makes her blunted and is not sure how much it helps her anxiety.  She can  have good days when her family is around and she is engaged.  She still wants to stop the olanzapine. She has apparently continued to take the trazodone despite having been told to stop it when she started olanzapine.  She feels like she needs the trazodone.  ECT-MADRS    Flowsheet Row Clinical Support from 08/06/2022 in [place] from 07/04/2022 in [place] from 05/21/2022 in [place] Visit from 03/02/2022 in [facility]  MADRS Total Score _0 46        Past Psychiatric Medication Trials: fluoxetine, duloxetine, Viibryd, lamotrigine, Pristiq, sertraline, citalopram,  Trintellix anxious and SI Wellbutrin XL 450 [place] 1 dose  Adderall, Adderall XR, Vyvanse, Ritalin, Strattera low dose NR Lorazepam Trazodone  Depakote,  lamotrigine cog complaints Lithium remotely Abilify 7.5  Vraylar  1.5 mg daily agitation and insomnia Rexulti insomnia Latuda 40 one dose, CO anxious and SI Seroquel XR 300 Olanzapine 10  At visit November 12, 2019. We discussed Patient developed an increasingly severe alcohol dependence problem since her last visit in January.  She went to [facility] and has had no alcohol since then except 1 day.  She never abused stimulants but they took her off the stimulants at [facility].  Her ADD was markedly worse.  The Wellbutrin did not help the ADD.   D history lamotrigine rash at 66 yo  Review of Systems:  Review of Systems  Constitutional:  Positive for fatigue.  Cardiovascular:  Negative for palpitations.  Musculoskeletal:  Positive for arthralgias and back pain. Negative for joint swelling.       SP hip surgery October 2020  Neurological:  Negative for dizziness, tremors and weakness.  Psychiatric/Behavioral:  Positive for decreased concentration and dysphoric mood. Negative for agitation, behavioral problems, confusion, hallucinations, self-injury, sleep disturbance and suicidal ideas. The patient is nervous/anxious. The patient is not hyperactive.     Medications: I have reviewed the patient's current medications.  Current Outpatient Medications  Medication Sig Dispense Refill  . amLODipine (NORVASC) 2.5 MG tablet Take 2.5 mg by mouth daily.    Marland Kitchen amphetamine-dextroamphetamine (ADDERALL XR) 20 MG 24 hr capsule Take 1 capsule (20 mg total) by mouth every morning. 30 capsule 0  . amphetamine-dextroamphetamine (ADDERALL XR) 20 MG 24 hr capsule Take 1 capsule (20 mg total) by mouth every morning. 30 capsule 0  . buPROPion (WELLBUTRIN XL) 150 MG 24 hr tablet TAKE 3 TABLETS BY MOUTH DAILY 270 tablet 1  . cloNIDine (CATAPRES) 0.2 MG tablet TAKE 1 TABLET BY MOUTH 2 TIMES DAILY. 60 tablet 1  . iron polysaccharides (NIFEREX) 150 MG capsule TAKE 1 CAPSULE BY MOUTH EVERY DAY 90 capsule 1  . LORazepam (ATIVAN) 1 MG tablet Take 1 tablet (1 mg total) by  mouth every 8 (eight) hours as needed. for anxiety 90 tablet 0  . losartan (COZAAR) 50 MG tablet Take 50 mg by mouth daily.    . nebivolol (BYSTOLIC) 2.5 MG tablet Take 2.5 mg by mouth daily.    . nortriptyline (PAMELOR) 25 MG capsule 1 at night for 5 nights then 2 at night for 5 nights then 3 nightly 90 capsule 0  . SPRAVATO, 84 MG DOSE, 28 MG/DEVICE SOPK USE 3 SPRAYS IN EACH NOSTRIL TWICE A WEEK 3 each 0  . traZODone (DESYREL) 50 MG tablet 1-2 tablets nightly as needed for sleep 60 tablet 1   No current facility-administered medications for this visit.  Medication Side Effects: None  Allergies:  Allergies  Allergen Reactions  . Metronidazole Shortness Of Breath and Other (See Comments)    Heart pounding  . Ferrlecit [Na Ferric Gluc Cplx In Sucrose] Other (See Comments)    Infusion reaction 05/12/2019    Past Medical History:  Diagnosis Date  . ADHD   . Anemia   . Anxiety   . Arthritis   . Depression   . Heart murmur    i went to see a cardiologit slast eyar  and i had zero plaque,   . PONV (postoperative nausea and vomiting)   . Recovering alcoholic in remission [facility])     Family History  Problem Relation Age of Onset  . Atrial fibrillation Mother   . CAD Father     Past Medical History, Surgical history, Social history, and Family history were reviewed and updated as appropriate.   Please see review of systems for further details on the patient's review from today.   Objective:   Physical Exam:  There were no vitals taken for this visit.  Physical Exam Constitutional:      General: She is not in acute distress. Neurological:     Mental Status: She is alert and oriented to person, place, and time.     Coordination: Coordination normal.     Gait: Gait normal.  Psychiatric:        Attention and Perception: Attention and perception normal.        Mood and Affect: Mood is anxious and depressed. Affect is not labile, blunt or tearful.        Speech: Speech is  not rapid and pressured or slurred.        Behavior: Behavior is not slowed.        Thought Content: Thought content is not paranoid or delusional. Thought content does not include homicidal or suicidal ideation. Thought content does not include suicidal plan.        Cognition and Memory: Cognition normal. Memory is impaired. She does not exhibit impaired recent memory.        Judgment: Judgment normal.     Comments: Insight intact. No auditory or visual hallucinations. No delusions.  Depression clearly improved with the switch from Seroquel to olanzapine.  Anxiety is further improved as wel but chronic Affect still depressed but less blunted than it was before Spravato.  Still blunted No Sui intent plan     Lab Review:     Component Value Date/Time   NA 137 01/12/2021 1430   NA 140 11/18/2018 1544   K 3.8 01/12/2021 1430   CL 108 01/12/2021 1430   CO2 22 01/12/2021 1430   GLUCOSE 94 01/12/2021 1430   BUN 14 01/12/2021 1430   BUN 20 11/18/2018 1544   CREATININE 0.82 01/12/2021 1430   CALCIUM 8.9 01/12/2021 1430   PROT 6.6 01/12/2021 1430   ALBUMIN 3.9 01/12/2021 1430   AST 12 (L) 01/12/2021 1430   ALT 11 01/12/2021 1430   ALKPHOS 46 01/12/2021 1430   BILITOT 0.5 01/12/2021 1430   GFRNONAA >60 01/12/2021 1430   GFRAA >60 09/02/2019 0249   GFRAA >60 01/27/2019 0811       Component Value Date/Time   WBC 4.5 01/12/2021 1430   RBC 4.32 01/12/2021 1430   HGB 12.8 01/12/2021 1430   HGB 12.9 07/17/2019 0953   HCT 38.5 01/12/2021 1430   HCT 21.9 (L) 12/25/2018 1221   PLT 272 01/12/2021 1430   PLT 286 07/17/2019 0953  MCV 89.1 01/12/2021 1430   MCH 29.6 01/12/2021 1430   MCHC 33.2 01/12/2021 1430   RDW 12.4 01/12/2021 1430   LYMPHSABS 1.4 01/12/2021 1430   MONOABS 0.4 01/12/2021 1430   EOSABS 0.0 01/12/2021 1430   BASOSABS 0.0 01/12/2021 1430    No results found for: "POCLITH", "LITHIUM"   No results found for: "PHENYTOIN", "PHENOBARB", "VALPROATE", "CBMZ"    .res Assessment: Plan:    Recurrent major depression resistant to treatment ([place]) - Plan: nortriptyline (PAMELOR) 25 MG capsule  Generalized anxiety disorder  Attention deficit hyperactivity disorder (ADHD), predominantly inattentive type  Insomnia due to mental condition - Plan: traZODone (DESYREL) 50 MG tablet   She has treatment resistant major depression at this time.  Have  discussed some of her recent abnormal behaviors leading to this depressive episode getting worse which she says were associated with heavy use of delta 8 and not a manic episode.  She realizes now that that was not good for her.  She stopped all use of other drugs including those available over-the-counter such as delta 8 or any other THC related products.  She is no longer having any of those types of behaviors and instead is depressed.  She remains depressed with lack of interest and lack of feeling for things that normally she would have feelings about.  She has low motivation and energy.  She is difficulty making decisions.  She is sad and down.  She is less productive than usual.  Her concentration is poor.  She has high degree of anxiety as well. She has a very negative self-image and low self-esteem.  These are all uncharacteristic for her However all of these symptoms are partially improved through Spravato and the switch from Seroquel to olanzapine.  She is approximately 40 to 50% better.  Patient was administered Spravato 84 mg intranasally today.  The patient experienced the typical dissociation which gradually resolved over the 2-hour period of observation.  There were no complications.  Specifically the patient did not have nausea or vomiting or headache.  Blood pressures remained within normal ranges at the 40-minute and 2-hour follow-up intervals.  By the time the 2-hour observation period was met the patient was alert and oriented and able to exit without assistance.  Patient feels the Spravato  administration is helpful for the treatment resistant depression and would like to continue the treatment.  See nursing note for further details.  Failed multiple antidepressants.  Many of them were not actual failures but intolerances and it is unclear whether some of that was more connected with anxiety than true side effects.  1 example is the [place].  In general she does not want to try anything but an antidepressant but has failed all major categories of antidepressants except TCAs and MAO inhibitors which have not been tried.  She wants to continue Spravato. We discussed discussed the side effects in detail as well as the protocol required to receive Spravato.   Objectively she appeared improved with olanzapine 10 mg nightly but depression not resolved.  She wants to stop it as she feels like it is suppressing her normal personality.  However her normal personality had been suppressed by the depression predating olanzapine. Per her request we will DC olanzapine and she was cautioned to observe for worsening anxiety or depression.  In its place the next logical antidepressant would be to use a tricyclic antidepressant as she has not taken 1 of these medications before. Start nortriptyline 25 mg nightly and build  up to 75 mg nightly and then check blood level.  Discussed side effects in detail. Extensive discussion again her ambivalence about meds and missatributing sx of depression as SE of meds.   She agrees to continue olanzapine 10 mg HS for another couple of weeks.  continue Wellbutrin XL to prior dose 450 mg AM  Started Spravato 84 mg twice weekly on 03/16/2022.  But has missed several doses due to elevated blood pressure prior to administration  Adderall  XR 20 mg AM  Ok temporary Ativan 1 mg 3-4 times daily as needed anxiety but try to cut it back. Is not ideal to use benzodiazepine with stimulant but because of the severity of her symptoms it has been necessary.  Hope to eventually  eliminate the benzodiazepine.  Expected as her depression improves her anxiety will improve as well.  However lately her anxiety has been unmanageable.  We will expect that to improve as the depression improves.  She has headed insturctions to reduce this.  Continue clonidine 0.2 mg BID off label for anxiety and helps BP partially. BP is better controlled.  Discussed potential benefits, risks, and side effects of stimulants with patient to include increased heart rate, palpitations, insomnia, increased anxiety, increased irritability, or decreased appetite.  Instructed patient to contact office if experiencing any significant tolerability issues. She wants to return to usual dose of Adderall for ADD bc of mor poor cognitive function with reduction.  Also discussed that depression will impair cognitive function.  [place] consultation was initially denied but she understands is being appealed if needed.  An appeal is ongoing at this time.  However because she is improving recently she wants to continue the current treatment plan.  Her symptoms have been very severe up until present but she is beginning to see more normal affect and her family notices the improvement as well.  Extensive discussion again about her chronic ambivalence about psychiatric medicines and tendency to blame medicine for the symptoms of depression that she had prior to even starting the medications.  After discussing this at length she was able to acknowledge that that is factual.  She does not appear to be having any significant side effects with the current medications.  Has Maintained sobriety  FU with Spravato weekly unless starts TMS then will stop it.  Lynder Parents, MD, DFAPA     Please see After Visit Summary for patient specific instructions.  No future appointments.           No orders of the defined types were placed in this encounter.      -------------------------------

## 2022-08-13 NOTE — Progress Notes (Signed)
NURSES NOTE:   Patient arrived for her 35th Spravato treatment. Pt is being treated for Treatment Resistant Depression, pt will be receiving 84 mg which will continue to be her maintenance dose, she receives weekly treatments.  Pt is also planning on [place] as soon as insurance will approve her treatments, then she will stop Spravato at that time. Patient arrived and taken to treatment room. Confirmed she had a ride home which is her husband would be coming back to pick up pt when done and sometimes she needs to use [facility] if he is unable to pick her up. Pt's Spravato is ordered through [employer] and delivered to office, all Spravato medication is stored at doctors office per REMS/FDA guidelines. The medication is required to be locked behind two doors per FDA/REMS Protocol. Medication is also disposed of properly per regulations.      Began taking patient's vital signs at 9:40 AM 132/83, pulse 54. Instructed patient to blow her nose then recline back to a 45 degree angle. Gave patient first dose 28 mg nasal spray, each nasal spray administered in each nostril as directed and waited 5 minutes between the second and third dose. All 3 doses given pt did not complain of any nausea/vomiting, given a cup of water due to the taste after the administration of Spravato.  She listens to [employer] with spa or relaxing music.  Checked 40 minute vitals at 10:20 AM, 143/77, pulse was 66. Explained she would be monitored for a total time of 120 minutes. Discharge vitals were taken at 11:35 PM 124/76 P 75. Dr. Clovis Pu and our new NP at the office Audria Nine met with her and discussed her medication and her plans for [place], a letter had to be sent to confirm pt was not diagnosed with bipolar. We did have trouble faxing but we finally got it sent on Friday.  I walked pt to elevator, where her husband met her for the ride home. Recommend she go home and sleep or just relax on the couch. No driving, no intense activities.  Verbalized understanding. Nurse was with pt a total of 70 minutes for clinical. Pt is coming back in on Monday, October 9th. Pt instructed to call office with any problems or questions.      LOT [id number] EXP AUG 2025

## 2022-08-15 ENCOUNTER — Other Ambulatory Visit: Payer: Self-pay

## 2022-08-15 MED ORDER — SPRAVATO (84 MG DOSE) 28 MG/DEVICE NA SOPK: PACK | NASAL | 5 refills | Status: DC

## 2022-08-20 ENCOUNTER — Ambulatory Visit (INDEPENDENT_AMBULATORY_CARE_PROVIDER_SITE_OTHER): Payer: 59 | Admitting: Adult Health

## 2022-08-20 ENCOUNTER — Encounter: Payer: Self-pay | Admitting: Adult Health

## 2022-08-20 ENCOUNTER — Ambulatory Visit: Payer: 59

## 2022-08-20 VITALS — BP 106/77 | HR 83

## 2022-08-20 DIAGNOSIS — F411 Generalized anxiety disorder: Secondary | ICD-10-CM | POA: Diagnosis not present

## 2022-08-20 DIAGNOSIS — F339 Major depressive disorder, recurrent, unspecified: Secondary | ICD-10-CM

## 2022-08-20 DIAGNOSIS — I10 Essential (primary) hypertension: Secondary | ICD-10-CM

## 2022-08-20 MED ORDER — CLONIDINE HCL 0.2 MG PO TABS: 0.2000 mg | ORAL_TABLET | Freq: Two times a day (BID) | ORAL | 1 refills | Status: DC

## 2022-08-20 NOTE — Progress Notes (Signed)
NURSES NOTE:   Patient arrived for her 20 th Spravato treatment. Pt is being treated for Treatment Resistant Depression, pt will be receiving 84 mg which will continue to be her maintenance dose, she receives weekly treatments.  Pt is also planning on [place] as soon as insurance will approve her treatments, then she will stop Spravato at that time. Patient arrived and taken to treatment room. Confirmed she had a ride home which is her husband would be coming back to pick up pt when done and sometimes she needs to use [facility] if he is unable to pick her up. Pt's Spravato is ordered through [employer] and delivered to office, all Spravato medication is stored at doctors office per REMS/FDA guidelines. The medication is required to be locked behind two doors per FDA/REMS Protocol. Medication is also disposed of properly per regulations.      Began taking patient's vital signs at 1:40 PM 157/100, pulse 77, rechecked vitals after given Clonidine 0.2 mg, pt reports leaving house in a hurry to get to dentist and forgot her Clonidine. 133/85, 85 pulse at 1:55 PM. Instructed patient to blow her nose then recline back to a 45 degree angle. Gave patient first dose 28 mg nasal spray, each nasal spray administered in each nostril as directed and waited 5 minutes between the second and third dose. All 3 doses given pt did not complain of any nausea/vomiting, given a cup of water due to the taste after the administration of Spravato.  She listens to [employer] with spa or relaxing music.  Checked 40 minute vitals at 2:45 PM, 128/92, pulse was 91. Explained she would be monitored for a total time of 120 minutes. Discharge vitals were taken at 4:00 PM 106/77 P 83. Deloria Lair, NP met with pt today and discussed her medication and her plans for [place]. I walked pt to elevator, where her husband met her for the ride home. Recommend she go home and sleep or just relax on the couch. No driving, no intense activities. Verbalized  understanding. Nurse was with pt a total of 70 minutes for clinical. Pt is coming back in on Monday, October 16th. Pt instructed to call office with any problems or questions.      LOT [id number] EXP AUG 2025

## 2022-08-20 NOTE — Progress Notes (Signed)
Laura Chang [id number] 13-Feb-1956 66 y.o.  Subjective:   Patient ID:  Laura Chang is a 66 y.o. (DOB 1955/11/13) female.  Chief Complaint: No chief complaint on file.   HPI Teonia D Bors presents to the office today for follow-up of treatment resistant depression (TRD).   Spravato treatment    Patient was administered Spravato 84 mg intranasally today. Patient was observed by provider throughout [facility] treatment. The patient experienced the typical dissociation which gradually resolved over the 2-hour period of observation. There were no complications. Specifically, the patient did not have any untoward side effects - feeling disconnected from themself, their thoughts, feelings and things around them, dizziness, nausea, feeling sleepy, decreased feeling of sensitivity (numbness) spinning sensation, feeling anxious, lack of energy, increased blood pressure, feeling happy or very excited, or headache. Blood pressures remained within normal ranges at the 40-minute and 2-hour follow-up intervals. By the time the 2-hour observation period was met the patient was alert and oriented and able to exit without assistance. Patient willing to continue Spravato administration for the treatment of resistant depression. See nursing note for further details.    Review of Systems:  Review of Systems  Musculoskeletal:  Negative for gait problem.  Neurological:  Negative for tremors.  Psychiatric/Behavioral:         Please refer to HPI    Medications: I have reviewed the patient's current medications.  Current Outpatient Medications  Medication Sig Dispense Refill   amLODipine (NORVASC) 2.5 MG tablet Take 2.5 mg by mouth daily.     amphetamine-dextroamphetamine (ADDERALL XR) 20 MG 24 hr capsule Take 1 capsule (20 mg total) by mouth every morning. 30 capsule 0   amphetamine-dextroamphetamine (ADDERALL XR) 20 MG 24 hr capsule Take 1 capsule (20 mg total) by mouth every morning. 30 capsule 0    buPROPion (WELLBUTRIN XL) 150 MG 24 hr tablet TAKE 3 TABLETS BY MOUTH DAILY 270 tablet 1   cloNIDine (CATAPRES) 0.2 MG tablet TAKE 1 TABLET BY MOUTH 2 TIMES DAILY. 60 tablet 1   Esketamine HCl, 84 MG Dose, (SPRAVATO, 84 MG DOSE,) 28 MG/DEVICE SOPK USE 3 SPRAYS IN EACH NOSE ONCE A WEEK 3 each 5   iron polysaccharides (NIFEREX) 150 MG capsule TAKE 1 CAPSULE BY MOUTH EVERY DAY 90 capsule 1   LORazepam (ATIVAN) 1 MG tablet Take 1 tablet (1 mg total) by mouth every 8 (eight) hours as needed. for anxiety 90 tablet 0   losartan (COZAAR) 50 MG tablet Take 50 mg by mouth daily.     nebivolol (BYSTOLIC) 2.5 MG tablet Take 2.5 mg by mouth daily.     nortriptyline (PAMELOR) 25 MG capsule 1 at night for 5 nights then 2 at night for 5 nights then 3 nightly 90 capsule 0   traZODone (DESYREL) 50 MG tablet 1-2 tablets nightly as needed for sleep 60 tablet 1   No current facility-administered medications for this visit.    Medication Side Effects: None  Allergies:  Allergies  Allergen Reactions   Metronidazole Shortness Of Breath and Other (See Comments)    Heart pounding   Ferrlecit [Na Ferric Gluc Cplx In Sucrose] Other (See Comments)    Infusion reaction 05/12/2019    Past Medical History:  Diagnosis Date   ADHD    Anemia    Anxiety    Arthritis    Depression    Heart murmur    i went to see a cardiologit slast eyar  and i had zero plaque,  PONV (postoperative nausea and vomiting)    Recovering alcoholic in remission [facility])     Past Medical History, Surgical history, Social history, and Family history were reviewed and updated as appropriate.   Please see review of systems for further details on the patient's review from today.   Objective:   Physical Exam:  There were no vitals taken for this visit.  Physical Exam Constitutional:      General: She is not in acute distress. Musculoskeletal:        General: No deformity.  Neurological:     Mental Status: She is alert and oriented  to person, place, and time.     Coordination: Coordination normal.  Psychiatric:        Attention and Perception: Attention and perception normal. She does not perceive auditory or visual hallucinations.        Mood and Affect: Mood normal. Mood is not anxious or depressed. Affect is not labile, blunt, angry or inappropriate.        Speech: Speech normal.        Behavior: Behavior normal.        Thought Content: Thought content normal. Thought content is not paranoid or delusional. Thought content does not include homicidal or suicidal ideation. Thought content does not include homicidal or suicidal plan.        Cognition and Memory: Cognition and memory normal.        Judgment: Judgment normal.     Comments: Insight intact     Lab Review:     Component Value Date/Time   NA 137 01/12/2021 1430   NA 140 11/18/2018 1544   K 3.8 01/12/2021 1430   CL 108 01/12/2021 1430   CO2 22 01/12/2021 1430   GLUCOSE 94 01/12/2021 1430   BUN 14 01/12/2021 1430   BUN 20 11/18/2018 1544   CREATININE 0.82 01/12/2021 1430   CALCIUM 8.9 01/12/2021 1430   PROT 6.6 01/12/2021 1430   ALBUMIN 3.9 01/12/2021 1430   AST 12 (L) 01/12/2021 1430   ALT 11 01/12/2021 1430   ALKPHOS 46 01/12/2021 1430   BILITOT 0.5 01/12/2021 1430   GFRNONAA >60 01/12/2021 1430   GFRAA >60 09/02/2019 0249   GFRAA >60 01/27/2019 0811       Component Value Date/Time   WBC 4.5 01/12/2021 1430   RBC 4.32 01/12/2021 1430   HGB 12.8 01/12/2021 1430   HGB 12.9 07/17/2019 0953   HCT 38.5 01/12/2021 1430   HCT 21.9 (L) 12/25/2018 1221   PLT 272 01/12/2021 1430   PLT 286 07/17/2019 0953   MCV 89.1 01/12/2021 1430   MCH 29.6 01/12/2021 1430   MCHC 33.2 01/12/2021 1430   RDW 12.4 01/12/2021 1430   LYMPHSABS 1.4 01/12/2021 1430   MONOABS 0.4 01/12/2021 1430   EOSABS 0.0 01/12/2021 1430   BASOSABS 0.0 01/12/2021 1430    No results found for: "POCLITH", "LITHIUM"   No results found for: "PHENYTOIN", "PHENOBARB",  "VALPROATE", "CBMZ"   .res Assessment: Plan:    Recommendations/Plan   Continue Spravato treatment.   Patient advised to contact office with any questions, adverse effects, or acute worsening in signs and symptoms.  There are no diagnoses linked to this encounter.   Please see After Visit Summary for patient specific instructions.  No future appointments.  No orders of the defined types were placed in this encounter.   -------------------------------

## 2022-08-22 ENCOUNTER — Other Ambulatory Visit: Payer: Self-pay | Admitting: Psychiatry

## 2022-08-27 ENCOUNTER — Ambulatory Visit: Payer: 59

## 2022-08-27 ENCOUNTER — Ambulatory Visit (INDEPENDENT_AMBULATORY_CARE_PROVIDER_SITE_OTHER): Payer: 59 | Admitting: Psychiatry

## 2022-08-27 ENCOUNTER — Encounter: Payer: Self-pay | Admitting: Psychiatry

## 2022-08-27 VITALS — BP 146/93 | HR 72

## 2022-08-27 DIAGNOSIS — F411 Generalized anxiety disorder: Secondary | ICD-10-CM

## 2022-08-27 DIAGNOSIS — F339 Major depressive disorder, recurrent, unspecified: Secondary | ICD-10-CM

## 2022-08-27 DIAGNOSIS — F5105 Insomnia due to other mental disorder: Secondary | ICD-10-CM | POA: Diagnosis not present

## 2022-08-27 DIAGNOSIS — I10 Essential (primary) hypertension: Secondary | ICD-10-CM

## 2022-08-27 DIAGNOSIS — F900 Attention-deficit hyperactivity disorder, predominantly inattentive type: Secondary | ICD-10-CM

## 2022-08-27 MED ORDER — AMPHETAMINE-DEXTROAMPHET ER 20 MG PO CP24: 20.0000 mg | ORAL_CAPSULE | ORAL | 0 refills | Status: DC

## 2022-08-27 NOTE — Progress Notes (Signed)
Laura Chang [id number] 1956/08/01 66 y.o.  Subjective:   Patient ID:  Laura Chang is a 66 y.o. (DOB Feb 15, 1956) female.  Chief Complaint:  Chief Complaint  Patient presents with   Follow-up   Depression   Anxiety   Fatigue   ADD     HPI Laura Chang presents to the office today for follow-up of depression and anxiety and ADD.  seen November 12, 2019.  Melted down in 2020.  Went to [employer] in July.  No withdrawal.  1 drink since then.  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 She was on no stimulant and no SSRI but was taking Strattera and Wellbutrin.  The following changes were made. Stop Strattera. OK restart stimulant bc severe ADD Restart Adderall 1 daily for a few days and if tolerated then restart 1 twice daily. If not tolerated reduce the dosage if needed. May need to stop Wellbutrin if not tolerating the stimulant.  Yes.  DC Wellbutrin Restart Prozac 20 mg daily.  February 2021 appointment with the following noted: Completed grant proposal.  Couldn't doit without Adderall.  Sold a bunch of work.   Adderall XR lasts about 3 pm.  Strength seems about right.  BP been OK.  Not jittery.   Stopped Wellbutrin but had no SE. Mood drastically better with grant proposal and back on fluoxetine.  Less depressed and lethargic.  No anxiety.  Cut back on coffee. Started back with devotions and stronger faith. Plan: Continue Prozac 20 mg daily. May have to increase the dose at some point in the future given that she usually was taking higher dosages but she is getting good response at this time. Restart Wellbutrin off label for ADD since can't get 2 ADDERALL daily. 150 mg daily then 300 mg daily. She can adjust the dose between 150 mg and 300 mg daily to get the optimal effect.   05/11/2020 appointment with the following noted: Has been inconsistent with Prozac and Wellbutrin. Not sure of the effect of Wellbutrin. Biggest deterrent in work is  anxiety.  Some of the work is conceptual and difficult at times.  Can feel she's not up to a project at times.  Overall is OK but would like a steadier benefit from stimulant.  Exhausted from managing concentration and keeping up with things from the day.  Loses things.  Not good keeping up with schedule. Overall productive and emotionally OK. Can feel Adderall wear off. Mood is better in summer and worse in the winter.   F died in 2023-10-04 and that is a loss. No SE Wellbutrin. Still attends AA meetings.  Real benefit from [place] last year. Recognizes effect of anemia on ADD and mood.  Had iron infusions last winter. Plan:  Wellbutrin off label for ADD since can't get 2 ADDERALL daily. 150 mg daily then 300 mg daily.  01/24/2021 appointment with following noted: Doing a program called [employer] mindfulness app since Xmas.  CBT app helped the depression.  App helped her focus better.  Lost sign weight. Writing a lot. Before Xmas felt depressed and started negative thinking worse, self denigrating. Not drinking. More isolated.   Recognizes mo is narcissist.    Didn't tell anyone she was born until 3 mos later.  M aloof and uninterested in pt.  Lied about her birthday.  Mo lack of affection even with pt's kids. Going to [employer] for a year and it helped her to quit drinking. Also misses  kids being gone with a hole also. Plan: No med changes  05/04/2021 appointment with the following noted: Therapist Paula Pile thinks she's manic. Lost weight to 144#.   States she is still sleeping okay.  Admits she is hyper and recognizes that she is likely manic.  She feels great, euphoric with an increased sense of spiritual connectedness to God.  She has racing thoughts and talks fast and talks a lot and this is noted by her husband.  He thinks she is a bit hyper.  She has been able to maintain sobriety although she will have 1 glass of wine on special occasions but does not drink by herself.  She is not  drinking to excess.  She denies any dangerous impulsivity.  She is clearly not depressed and not particularly anxious.  She has no concerns about her medication and she has been compliant.  06/16/21 appt noted: So much better.  Going through a lot but the manic thing happened on top of it.  So much slower.  Didn't feel like losing anything with risperidone.  Likes the Adderalll at 10 mg. Some drowsiness in the AM and very drowsy from risperidone 2 mg HS. Prayer life is better. Handling stress better. Less depressed with risperidone. Still likes trazodone. Sleeps well. Plan: Reduce Prozac to 10 mg daily.  Consider stopping it because it can feel the mania however she is reluctant to do that because she fears relapse of depression. Reduce risperidone to 1.5 mg nightly due to side effects.  Discussed risk of worsening mania.  07/25/2021 appointment with the following noted: Misses the Adderall and hard to function without it. Depressed now. Heavy chest.  Anxious and guilty.  Body feels heavy.   Hates Wellbutrin.   Plan: Increase fluoxetine to 20 mg daily Add Abilify 1/2 of 15 mg tablet daily Wean wellbutrin by 1 tablet each week  bc she feels it is not helpful and DT polypharmacy Reduce risperidone to 1 daily for 1 week and stop it. Disc risk of mania. Increase Adderall to XR 20 mg AM  08/08/21 Much less depressed and starting to feel normal I feel a lot better. No SE.  Speech normal off risperidone. Sleeping OK on trazaodone and enough.   Noticed benefit from Adderall again. Plan: continue fluoxetine to 20 mg daily Continue Abilify 1/2 of 15 mg tablet daily for depression and mania continue Adderall to XR 20 mg AM  10/10/2021 phone call: Pt stated she feels like the Abilify should be decreased to 5mg.She said she is depressed but rational and not suicidal.She has an appt Monday and can wait until then if you prefer. MD response: Reduce the Abilify to 7.5 mg every other day.  We will meet on  10/16/2021 and decide what to do from there.  10/16/2021 appointment with the following noted: More depressed.  Most depressed I've ever been.  Just numb.  Sense of grief.   Thinks the manic episode was unlike anything else she ever had.  Doesn't want to medicate against it.  Don't enjoy people.  Easily overwhelmed.  Had some death thoughts but not suicidal.  Has been functional.  Feels better today after reducing Abilify to every other day but she is only been doing that for 3 days. A/P: Episode of post manic depression was explained. continue fluoxetine to 20 mg daily Hold Abilify for 1 week then resume Abilify 1/2 of 15 mg tablet every other day for depression and mania continue Adderall to XR 20 mg   AM  10/27/2021 appointment with the following noted: I'm doing so much better.  Handling the depression better. Better self talk and spiritual focus has helped.   Dep 6/10 manifesting as anxiety with low confidence.   F died 2  years ago and M 66 yo and is dependent . She is working hard to feel better but still feels depressed.  She almost feels like she has a little more anxiety since restarting Abilify every other day. Plan: continue fluoxetine to 20 mg daily DC Abilify .  Vrayalar 1.5 mg QOD to try to get rid of depression ASAP. continue Adderall to XR 20 mg AM  11/10/2021 appointment with the following noted: Busy with Xmas and it was fun with family but then a big let down.  Did well with it.  Functioned well with it.  Working hard on things with depression.  Not shutting down. Not sure but feels better today but yesterday was hard.  Difficulty dealing with mother.  She won't do anything to help herself.  Yesterday with her all day.  Won't do PT and has isolated herself.    Lack of confidence.   No SE with Vraylar.  11/24/21 urgent appointment appt noted: More and more depressed.   So anxious and doesn't want to be alone but can do so. No appetite. Hurts inside. Has had some fleeting  suicidal thoughts but would not act on them.  Tolerating meds. Has been consistent with Vraylar 1.5 mg every other day, fluoxetine 20 mg daily Plan: Increase Vraylar to 1.5 mg daily Change Prozac to Trintellix 10 mg daily. Discussed side effects of each continue Adderall to XR 20 mg AM  12/27/2021 appointment with the following noted: Not OK.  I feel less depressed but feels bat shit. Not sleeping well.  Extremely anxious. Off and on sleep. 3-4 hours of sleep.   Still having daily SI.  But also become obvious has so much to do.  Overwhelmed by tasks.   Needs anxiety meds to just function. Not more motivated.  Walked yesterday.   Feels afraid like in trouble but not irritable or angry. DC DT agitation Vraylar to 1.5 mg daily Change Prozac to Trintellix 10 mg daily. Hold Adderall to XR 20 mg AM Clonidine 0.1 1/2 tablet twice daily for 2 days and if needed for anxiety and sleep increase to 1 twice daily Ok temporary Ativan 1 mg 3 times daily as needed anxiety  01/05/22 appt noted: Off fluoxetine and  Trintellix.  Only on Ativan, trazodone and Adderall XR 20 plus added clonidine 0.1 mg BID Didn't think she needed to start Trintellix. Not taking Ativan.   Didn't like herself last week. Feels some better today. Wonders if the manic sx Not agitated.  Anxiety kind of calmed down.  A lot to be anxious about situationally.  $ stress. Concerns about downers with meds. Can't access normal personality. ? Lethargy and inability to talk as sE. Plan: Latuda 20-40 mg daily with food. Adderall to XR 20 mg AM Clonidine 0.1 1/2 tablet twice daily  reduce dose to be sure no SE Ok temporary Ativan 1 mg 3 times daily as needed anxiety  01/19/22 appt noted: Taking Latuda 20 mg daily.  Took 40 mg once and felt anxious and  SI Still depressed and not very reactive Anxiety mainly about the depression and fears of the future. She wants to revisit manic sx and thinks it was maybe bc taking delta 8 bc was  taking a lot of   it so still doesn't think she's classic bipolar. She wants to only take Prozac bc thinks Latuda is perpetuating depression. Says the delta 8 was very psychaedelic.  When not taking it was not manic.  Sleeping ok again.  Plan: Per her request DC Latuda 20-40 mg daily with food. She wants to continue Prozac alone AMA  Adderall to XR 20 mg AM Clonidine 0.1 1/2 tablet twice daily  reduce dose to be sure no SE Ok temporary Ativan 1 mg 3 times daily as needed anxiety  01/23/2022 phone call complaining of increased anxiety since stopping Latuda.  She will try increasing clonidine.  01/26/2022 phone call not feeling well and wanted to restart the Vraylar.  However notes indicate that had made her agitated therefore she was encouraged to pick up samples of Rexulti 1 mg and start that instead.  02/06/2022 phone call: Stating she felt the Rexulti was helping with depression but she was not sleeping well and obsessing over things.  She was encouraged to increase Rexulti to 2 mg daily and increase trazodone for sleep.  02/09/2022 appointment with the following noted: This was an urgent work in appointment No sleep last night with trazodone 100 mg HS Nothing really better depression or anxiety. Ruminating negative anxious thoughts. Did not tolerate Rexulti because it was causing insomnia.  Does not think it helped depression.  Lacks emotion that she should have.  Lacks her usual personality.  Some hopeless thoughts.  Some death thoughts.  Some suicidal thoughts without plan or intent Plan: DC Rexulti and Prozac & DC trazodone Adderall to XR 20 mg AM Clonidine 0.1 1/tablet twice daily  reduce dose to be sure no SE Ok temporary Ativan 1 mg 3 times daily as needed anxiety Start Seroquel XR 150 mg nightly  03/02/2022 appointment: Denise called back a few days after starting Seroquel stating it was making her more anxious and more depressed.  This seemed unlikely as this medicine rarely ever  causes anxiety.  She stopped the medication waited 3 days and called back still had anxiety and depression but thought perhaps the anxiety was a little better.  She did not want to take the Seroquel. She knew about the option of Spravato and wanted to pursue that. Now questions whether to return to Seroquel while waiting to start Spravato bc feels just as bad without it and knows she didn't give it enough time to work.   MADRS 46  ECT-MADRS    Flowsheet Row Clinical Support from 08/06/2022 in [facility] Clinical Support from 07/04/2022 in [facility] Clinical Support from 05/21/2022 in [facility] Office Visit from 03/02/2022 in [facility]  MADRS Total Score 29 15 27 46      03/14/22 appt noted: Pt received Spravato 56 mg first dose today with some dissociative sx which were not severe.  She was anxious prior to the administration and felt better after receiving lorazepam 1 mg.  No NV, or HA. Wants to continue Spravato. Ongoing depression and desperate to feel better.  I'm not myself DT deprsssion which is most severe in recent history.  Anhedonia.  Low motivation.  Social avoidance. Continues to think all recent med trials are making her worse.  Sleep ok with Seroquel.  03/16/22 appt noted: Received Spravato 84 mg for the first time.  some dissociative sx which were not severe.  She was anxious prior to the administration and felt better after receiving lorazepam 1 mg.  No NV, or HA. Wants   to continue Spravato.   Does not feel any better or different since the last appt.  Ongoing depression.  Ongoing depression and desperate to feel better.  I'm not myself DT deprsssion which is most severe in recent history.  Anhedonia.  Low motivation.  Social avoidance. Continues to think all recent med trials are making her worse.  Sleep ok with Seroquel.  Does not want to continue Seroquel for TRD.  03/20/2022 appointment noted: Came for  Spravato administration today.  However blood pressure was significantly elevated approximately 180/115.  She was given lorazepam 1 mg and clonidine 0.2 mg to try to get it down. She states she regretted stopping the Seroquel XR 300 mg tablets.  She now realizes it was helpful.  She did not sleep much at all last night.  She did not take the Adderall this morning. 2 to 3 hours after arrival blood pressure was still elevated at  170/110, 62 pulse.  For Spravato administration was canceled for today.  She admits to being anxious and depressed.  She is not suicidal.  She is highly motivated to receive the Spravato.  We discussed getting it tomorrow.  03/22/2022 appointment noted: Patient's blood pressure was never stable enough yesterday in order to get her in for Spravato administration.  She was encouraged to see her primary care doctor.  It is better today.  03/26/2022 appointment with the following noted: Blood pressure was better.  Saw her primary care doctor who started on oral Bystolic 2.5 mg daily. Received Spravato 84 mg today as scheduled.  Tolerated it well without nausea or vomiting headache or chest pain or palpitations.  Her blood pressure was borderline but manageable. She remains depressed and anxious.  She is ambivalent about the medicine and desperate to get to feel better.  Continues to have anhedonia and low energy and low motivation and reduced ability to do things.  Less social.  Not suicidal.  03/28/22 appt noted: Received Spravato 84 mg today as scheduled.  Tolerated it well without nausea or vomiting headache or chest pain or palpitations.  Her blood pressure was borderline but manageable. Has not seen any improvement so far.  Tolerating Seroquel.  Inconsistent with Bystolic and BP has been borderline high. Still depressed and anxious and anhedonia.  Low motivation, energy, productivity. Taking quetiapine and tolerating XR 300 mg nightly.  04/04/22 appt noted: Received Spravato  84 mg today as scheduled.  Tolerated it well without nausea or vomiting headache or chest pain or palpitations.  Her blood pressure was borderline but manageable. Has not seen any improvement so far.  Tolerating Seroquel.   She still tends to think that the medications are making her worse.  She has said this about each of the recent psychiatric medicines including Seroquel.  However her husband thinks she is improved.  She also admits there is some improvement in productivity.  She still feels highly anxious.  She still does not enjoy things as normal.  She still feels desperate to improve as soon as possible. Has been taking Seroquel XR since 03/20/2022  04/10/22 appt noted: Received Spravato 84 mg today as scheduled.  Tolerated it well without nausea or vomiting headache or chest pain or palpitations.  Her blood pressure was borderline but manageable. Has not seen any improvement so far.  Tolerating Seroquel.  Doesn't like Seroquel bc she thinks it flattens here. Ongoing depression without confidence Plan: Start Auvelity 1 every morning for persistent treatment resistant depression  04/12/2022 appointment with the following noted:  Received Spravato 84 mg today as scheduled.  Tolerated it well without nausea or vomiting headache or chest pain or palpitations.  Her blood pressure was borderline but manageable. Has not seen any improvement so far.  Tolerating Seroquel.  Doesn't like Seroquel bc she thinks it flattens her. Received Spravato 84 mg today as scheduled.  Tolerated it well without nausea or vomiting headache or chest pain or palpitations.  Her blood pressure was borderline but manageable. Has not seen any improvement so far.  Tolerating Seroquel.  Doesn't like Seroquel bc she thinks it flattens here.  We discussed her ambivalence about it. She is starting Auvelity and has tolerated it the last 2 days without side effect.  She still does not feel like herself and feels flat and not enjoying  things with suppressed expressed emotion  04/17/2022 appointment with the following noted: Received Spravato 84 mg today as scheduled.  Tolerated it well without nausea or vomiting headache or chest pain or palpitations.  Her blood pressure was borderline but manageable. Has not seen any improvement so far.  Tolerating Seroquel.  Doesn't like Seroquel bc she thinks it flattens her. She has been tolerating the Auvelity 1 in the morning without side effects for about a week.  She has not noticed significant improvement so far.  She still feels depressed and flat and not herself.  Other people notice that she is flat emotionally.  She is not suicidal.  She does feel discouraged that she is not getting better yet.  04/19/2022 appointment noted: Has increased Auvelity to 1 twice daily for 2 days, continues quetiapine XR 300 mg nightly, clonidine 0.3 mg twice daily, lorazepam 1 mg twice daily for anxiety and Adderall XR 20 mg in the morning. No obious SE but she still thinks quetiapine XR is making her feel down.  But not sedated Received Spravato 84 mg today as scheduled.  Tolerated it well without nausea or vomiting headache or chest pain or palpitations.  Her blood pressure was borderline but manageable. She still feels quite anxious and feels it necessary to take both the clonidine and lorazepam twice a day to manage her anxiety.  She has been consistently down and flat and not herself until yesterday afternoon she noted an improvement in mood and feeling much more like herself with her normal personality reemerging.  She was quite depressed in the morning with very dark negative thoughts.  She did not have those dark negative thoughts this morning.  She had a lot of questions about medication and when she was expecting to be improved and why she has not shown improvement up to now.  04/23/22 appt noted: Has increased Auvelity to 1 twice daily for 1 week, continues quetiapine XR 300 mg nightly, clonidine 0.3  mg twice daily, lorazepam 1 mg twice daily for anxiety and Adderall XR 20 mg in the morning. No obious SE but she still thinks quetiapine XR is making her feel down.  But not sedated Received Spravato 84 mg today as scheduled.  Tolerated it well without nausea or vomiting headache or chest pain or palpitations.  She is still depressed but admits better function and is able to enjoy social interactions. Tolerating meds.  Would like to feel better for sure. Not herself.  Flat. Plan increase Auvelity to 1 tab BID as planned and reduce Quetiapine to 1/2 of ER 300 mg  bc NR for depression.  04/25/2022 appointment with the following noted: clonidine 0.3 mg twice daily, lorazepam 1 mg twice daily   for anxiety and Adderall XR 20 mg in the morning. Seroquel XR 300 HS No obious SE but she still thinks quetiapine XR is making her feel down.  But not sedated Received Spravato 84 mg today as scheduled.  Tolerated it well without nausea or vomiting headache or chest pain or palpitations.  Called yesterday with more anxiety.  Had increased Auvelity for 1 day and reduced Seroquel XR for 1 day.  Felt restless and fearful  05/01/2022 appointment noted: clonidine 0.3 mg twice daily, lorazepam 1 mg twice daily for anxiety and Adderall XR 20 mg in the morning. Seroquel XR 150 HS, Auvelity 1 BID Received Spravato 84 mg today as scheduled.  Tolerated it well without nausea or vomiting headache or chest pain or palpitations.  Nurse has noted patient has called multiple times sometimes asking the same question repeatedly.  It is unclear whether she is truly forgetful or is just anxious seeking reassurance. Patient acknowledges ongoing depression as well as some anxiety but states she has felt a little better in the last couple of days.  She has reduced the Seroquel to 150 mg at night and has increased Auvelity to 1 twice daily but only for 1 day.  So far she seems to be tolerating it.  05/03/22 appt noted: clonidine 0.2 mg  twice daily, lorazepam 1 mg twice daily for anxiety and Adderall XR 20 mg in the morning. Seroquel XR 150 HS, Auvelity 1 BID BP high this am about 170/100 and received extra clonidine 0.2 mg and came to receive Spravato.  Not dizzy, no SOB, nor CP but BP is still high Could not receive Spravato today bc BP high and pulse low at 30 ppm. Still depressed and anxious. Plan: continue trial Auvelity with Spravato She needs to get BP and pulse managed  05/08/22 TC: RTC  H Michael NA and mailbox full.  Could not leave message.  Pt  -  talked to she and H on speaker. H worried over wife.  Vacant stare.  Slurs words at times.  Not smiling. Reduced enjoyment.  Depression.  Withdrawn from usual activities.  Some irritability.  Anxious. Disc her concerns meds are making her worse.  Extensive discussion about her treatment resistant status.  There is a consistent pattern of not taking the medicines long enough to get benefit because she believes the meds are making her worse.  However the symptoms she describes as side effects are exactly the same symptoms that she had prior to taking the medication RX for  the depression.  So it is not clear that these are actual side effects. This is true about the 2 most recent meds including Seroquel and Auvelity.  Recommend psychiatric consultation in hopes of improving her comfort level with taking prescribed medications for a sufficient length of time to provide benefit. Extensive discussion about ECT is the treatment of choice for treatment resistant depression.  Spravato may work if she can comply with consistency.  There are medication options but they take longer to work.   Plan:  Reduce clonidine to 0.1 mg BID DT bradycardia.  Talk with PCP about BP and low pulse problems which are interfering with her consistent compliance with Spravato.   Limit lorazepam to 3 -4  mg daily max. Excess use is the cause of slurring speech.  She must stop excess use or will have to stop  the med. Stop Auvelity per her request.  But she has only been on the full dose for a little over  a week and clearly has not had time to get benefit from it.  She thinks maybe it is making her more anxious. Reduce Seroquel from 150XR to 50 -100 mg at night IR.  She couldn't sleep when stopped it completely. Will not start new antidepressant until her SE issues are resolved or not. Get second psych opinion from Chris Aiken MD or another psychiatrist.  H's sister is therapist in [place] Keryl Gholson Cottle, MD, DFAPA  05/16/2022 appointment with the following noted: Received Spravato 84 mg today as scheduled.  Tolerated it well without nausea or vomiting headache or chest pain or palpitations.  She stopped Auvelity as discussed last week. On her own, without physician input, she restarted Wellbutrin XL 450 mg every morning today.  She had taken it in the past.  She feels jittery and anxious. She feels less depressed than she did last week.  But she is still depressed without her usual range of affect.  She still is less social and less motivated than normal. Her primary care doctor increased the dose of losartan Plan: Stop Seroquel Reduce Wellbutrin XL to 300 mg every morning.  Starting the dose at 450 every morning is likely causing side effects of jitteriness and it should not be started at that have a dosage. Recommend she not change meds on her own without MDM put  05/23/2022 appointment with the following noted: Received Spravato 84 mg today as scheduled.  Tolerated it well without nausea or vomiting headache or chest pain or palpitations.  Has not dropped seroquel XR 300 mg 1/2 tablet nightly bc couldn't sleep without it. Has not tried lower dose quetiapine 50 mg HS Still feels depressed.   BP is better managed so far, just saw PCP.  BP is better today and infact is low today. Dropped clonidine as directed from 0.3 mg BID bc inadequate control of BP to 0.2 mg BID.  However she wants to increase it  back to 0.3 mg twice daily because she feels it helped her anxiety better.  Wonders about increasing Wellbutrin for depression.  However she has only been on 300 mg a day for a week.  She was on 450 mg daily in the past.  06/06/22 appt noted: Received Spravato 84 mg today as scheduled.  Tolerated it well without nausea or vomiting headache or chest pain or palpitations.  She is still depressed and anxious.  She wants to try to stop the Seroquel but cannot sleep without some of it.  She is taking lorazepam 1 mg 4 times daily and still having a lot of anxiety.  She wants to increase clonidine back to 0.3 mg twice daily.  She hopes for more improvement She recently went for a second psychiatric opinion as suggested the results of that are pending.  06/11/22 appt noted: Received Spravato 84 mg today as scheduled.  Tolerated it well without nausea or vomiting headache or chest pain or palpitations.  She is still depressed and anxious. Without much change.  Still hopeless, anhedonia, reduced inteterest and motivation.  Tolerating meds. Disc concerns Spravato is not hleping much. Plan: stop Seroquel and start olanzapine 10 mg HS for TRD and anxiety.  06/13/2022 appointment noted: Received Spravato 84 mg today as scheduled.  Tolerated it well without nausea or vomiting headache or chest pain or palpitations.  She is still depressed and anxious. Without much change.  Still hopeless, anhedonia, reduced inteterest and motivation.  Tolerating meds. Disc concerns Spravato is not helping much as hoped but is   improving a bit in the last week. Tolerating meds. Continues Wellbutrin XL 450 AM, tolerating recently started olanzapine  10 mg HS. Sleep is good.   Pending appt with TMS consult.  06/18/22 appt noted: Received Spravato 84 mg today as scheduled.  Tolerated it well without nausea or vomiting headache or chest pain or palpitations.  Tolerating meds. Continues Wellbutrin XL 450 AM, tolerating recently started  olanzapine  10 mg HS. Continues Adderall XR 20 amd and has tried to reduce lorazepam to 1mg TID Sleep is good.   Pending appt with TMS consult. Depression is a little bit better in the last week with a little improvement in emotional expression and interest.  She is pushing herself to be more active.  Her daughter thought she was a little better than she has been.  However she is still depressed and still not her normal self with anhedonia and reduced emotional expressiveness.  06/20/22 appt noted: Received Spravato 84 mg today as scheduled.  Tolerated it well without nausea or vomiting headache or chest pain or palpitations.  Tolerating meds with a little sleepiness. Continues Wellbutrin XL 450 AM, tolerating recently started olanzapine  10 mg HS. Continues Adderall XR 20 amd and has tried to reduce lorazepam to 1mg TID Sleep is good.   Mood is improving.  Better funciton.  Anxiety is better with olanzapine. Still not herself and depression not gone with some anhedonia and social avoidance and feeling overwhelmed.  8/14 2023 received Spravato 84 mg 06/27/2022 received Spravato Spravato 84 mg 07/02/2022 received Spravato 84 mg 07/04/2022 received Spravato 84 mg  07/09/2022 appointment noted: Received Spravato 84 mg today as scheduled.  Tolerated it well without nausea or vomiting headache or chest pain or palpitations.  Expected dissociation and feels less depressed with resolution of negative emotions immediately after Spravato and then depression, anxiety creep back in. Continues meds Adderall XR 20 mg every morning, Wellbutrin XL 450 every morning, clonidine 0.1 mg twice daily, lorazepam 1 mg every 6 hours as needed, olanzapine increased from 7.5 to 10 mg nightly on  Tolerating meds.  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  She does note some increased appetite and is  somewhat concerned about that but has not gained significant amounts of weight. She has had the TMS consultation which was initially denied but she knows it can be appealed.  However because she is improving with Spravato plus the other medications now she wants to continue the current treatment plan.  07/18/22 appt noted: Continues meds Adderall XR 20 mg every morning, Wellbutrin XL 450 every morning, clonidine 0.1 mg twice daily, lorazepam 1 mg every 6 hours as needed, olanzapine increased from 7.5 to 10 mg nightly on 07/04/2022. Received Spravato 84 mg today as scheduled.  Tolerated it well without nausea or vomiting headache or chest pain or palpitations.  Expected dissociation and feels less depressed with resolution of negative emotions immediately after Spravato and then depression, anxiety creep back in. Continues meds Adderall XR 20 mg every morning, Wellbutrin XL 450 every morning, clonidine 0.1 mg twice daily, lorazepam 1 mg every 6 hours as needed, olanzapine increased from 7.5 to 10 mg nightly on  Tolerating meds.  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  She does note some increased appetite and   is somewhat concerned about that but has not gained significant amounts of weight. She has had the [place] consultation which was initially denied but she knows it can be appealed. She continues to have chronic ambivalence about psychiatric medicines and initially tends to blame her depressive symptoms such as decreased concentration and feeling flat on what ever medicine she currently is taking even though she had the same symptoms before the current medicines were started.  Then after discussion she does admit that her depressive symptoms are improved since adding olanzapine but still has those residual symptoms noted.  07/23/22 received Spravato 84 mg   07/30/2022 appointment  noted: Received Spravato 84 mg today as scheduled.  Tolerated it well without nausea or vomiting headache or chest pain or palpitations.  Expected dissociation and feels less depressed with resolution of negative emotions immediately after Spravato and then depression, anxiety creep back in. Continues meds Adderall XR 20 mg every morning, Wellbutrin XL 450 every morning, clonidine 0.1 mg twice daily, lorazepam 1 mg every 6 hours as needed, olanzapine increased from 7.5 to 10 mg nightly on  She has been inconsistent with olanzapine because she continues to be ambivalent about the medications in general and thinks that perhaps the 10 mg is making her feel blunted.  She continues to feel some depression.  She had a good day this week and but still feels somewhat depressed and persistently anxious. Plan: be consistent with olanzapine 10 mg HS for TRD and longer trial for potential benefit for anxiety.  Has not taken it consistently.  08/06/2022 appointment noted: Received Spravato 84 mg today as scheduled.  Tolerated it well without nausea or vomiting headache or chest pain or palpitations.  Expected dissociation and feels less depressed with resolution of negative emotions immediately after Spravato and then depression, anxiety creep back in. Continues meds Adderall XR 20 mg every morning, Wellbutrin XL 450 every morning, clonidine 0.1 mg twice daily, lorazepam 1 mg every 6 hours as needed, olanzapine i 10 mg nightly  She continues to feel depressed but is about 50% better with Spravato.  She is still not herself.  She still has anhedonia.  She still is not her able to engage socially in the typical ways.  She is not jovial and outgoing like normal.  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  She continues to feel that her personality is dampened down.  There is a question about whether it is  related to depression or medication. Plan: continue olanzapine 10 for longer trial for TRD and severe anxiety.  08/13/22 appt noted:  Received Spravato 84 mg today as scheduled.  Tolerated it well without nausea or vomiting headache or chest pain or palpitations.  Expected dissociation and feels less depressed with resolution of negative emotions immediately after Spravato and then depression, anxiety creep back in. Continues meds Adderall XR 20 mg every morning, Wellbutrin XL 450 every morning, clonidine 0.1 mg twice daily, lorazepam 1 mg every 6 hours as needed, olanzapine i 10 mg nightly  She still does not feel herself.  Still struggles with depression and low motivation and reduced social engagement and reduced interest and reduced emotional expression.  She is somewhat better with the medicines plus Spravato.  She still believes the Spravato makes her blunted and is not sure how much it helps her anxiety.  She can have good days when  her family is around and she is engaged.  She still wants to stop the olanzapine. She has apparently continued to take the trazodone despite having been told to stop it when she started olanzapine.  She feels like she needs the trazodone. Plan: DC olanzapine and Start nortriptyline 25 mg nightly and build up to 75 mg nightly and then check blood level.    08/27/2022 appointment noted: Received Spravato 84 mg today as scheduled.  Tolerated it well without nausea or vomiting headache or chest pain or palpitations.  Expected dissociation and feels less depressed with resolution of negative emotions immediately after Spravato and then depression, anxiety creep back in. Continues meds Adderall XR 20 mg every morning, Wellbutrin XL 450 every morning, clonidine 0.1 mg twice daily, lorazepam 1 mg every 6 hours as needed. Stopped olanzapine and started nortriptyline which she has taken for about a week is 75 mg nightly. So far she is tolerating the nortriptyline well with the  exception of some dry mouth and constipation which she is working to manage.  She does not feel substantially better better or different off the olanzapine.  No change in her sleep which is good.  Main concern currently in addition to the residual depression is anxiety which is somewhat situational with pending arch show.  She is worrying about it more than normal.  Says she is having to take lorazepam twice a day where she had been able to keep reduce it prior to this.  She still does not feel like herself with residual depression with less social interest and less of her usual buoyancy in personality.  She is flatter than normal.  Overall she still feels that the Spravato has been helpful at reducing the severity of the depression.  She is not suicidal. She has not heard anything about the TMS appeal as of yet.   ECT-MADRS    Flowsheet Row Clinical Support from 08/06/2022 in [facility] Clinical Support from 07/04/2022 in [facility] Clinical Support from 05/21/2022 in [facility] Office Visit from 03/02/2022 in [facility]  MADRS Total Score 29 15 27  46        Past Psychiatric Medication Trials: fluoxetine, duloxetine, Viibryd, lamotrigine, Pristiq, sertraline, citalopram,  Trintellix anxious and SI Wellbutrin XL 450 [place] 1 dose  Adderall, Adderall XR, Vyvanse, Ritalin, Strattera low dose NR Lorazepam Trazodone  Depakote,  lamotrigine cog complaints Lithium remotely Abilify 7.5  Vraylar 1.5 mg daily agitation and insomnia Rexulti insomnia Latuda 40 one dose, CO anxious and SI Seroquel XR 300 Olanzapine 10  At visit November 12, 2019. We discussed Patient developed an increasingly severe alcohol dependence problem since her last visit in January.  She went to [place] and has had no alcohol since then except 1 day.  She never abused stimulants but they took her off the stimulants at [facility].  Her ADD  was markedly worse.  The Wellbutrin did not help the ADD.   D history lamotrigine rash at 66 yo  Review of Systems:  Review of Systems  Constitutional:  Positive for fatigue.  Cardiovascular:  Negative for chest pain and palpitations.  Musculoskeletal:  Positive for arthralgias and back pain. Negative for joint swelling.       SP hip surgery October 2020  Neurological:  Negative for dizziness and tremors.  Psychiatric/Behavioral:  Positive for decreased concentration and dysphoric mood. Negative for agitation, behavioral problems, confusion, hallucinations, self-injury, sleep disturbance and suicidal ideas. The patient is nervous/anxious. The  patient is not hyperactive.     Medications: I have reviewed the patient's current medications.  Current Outpatient Medications  Medication Sig Dispense Refill   amLODipine (NORVASC) 2.5 MG tablet Take 2.5 mg by mouth daily.     amphetamine-dextroamphetamine (ADDERALL XR) 20 MG 24 hr capsule Take 1 capsule (20 mg total) by mouth every morning. 30 capsule 0   buPROPion (WELLBUTRIN XL) 150 MG 24 hr tablet TAKE 3 TABLETS BY MOUTH DAILY 270 tablet 1   cloNIDine (CATAPRES) 0.2 MG tablet Take 1 tablet (0.2 mg total) by mouth 2 (two) times daily. 60 tablet 1   Esketamine HCl, 84 MG Dose, (SPRAVATO, 84 MG DOSE,) 28 MG/DEVICE SOPK USE 3 SPRAYS IN EACH NOSTRIL TWICE A WEEK 3 each 3   iron polysaccharides (NIFEREX) 150 MG capsule TAKE 1 CAPSULE BY MOUTH EVERY DAY 90 capsule 1   LORazepam (ATIVAN) 1 MG tablet Take 1 tablet (1 mg total) by mouth every 8 (eight) hours as needed. for anxiety 90 tablet 0   losartan (COZAAR) 50 MG tablet Take 50 mg by mouth daily.     nebivolol (BYSTOLIC) 2.5 MG tablet Take 2.5 mg by mouth daily.     nortriptyline (PAMELOR) 25 MG capsule 1 at night for 5 nights then 2 at night for 5 nights then 3 nightly (Patient taking differently: Take 75 mg by mouth at bedtime.) 90 capsule 0   traZODone (DESYREL) 50 MG tablet 1-2 tablets nightly  as needed for sleep 60 tablet 1   amphetamine-dextroamphetamine (ADDERALL XR) 20 MG 24 hr capsule Take 1 capsule (20 mg total) by mouth every morning. 30 capsule 0   No current facility-administered medications for this visit.    Medication Side Effects: None  Allergies:  Allergies  Allergen Reactions   Metronidazole Shortness Of Breath and Other (See Comments)    Heart pounding   Ferrlecit [Na Ferric Gluc Cplx In Sucrose] Other (See Comments)    Infusion reaction 05/12/2019    Past Medical History:  Diagnosis Date   ADHD    Anemia    Anxiety    Arthritis    Depression    Heart murmur    i went to see a cardiologit slast eyar  and i had zero plaque,    PONV (postoperative nausea and vomiting)    Recovering alcoholic in remission ([place])     Family History  Problem Relation Age of Onset   Atrial fibrillation Mother    CAD Father     Past Medical History, Surgical history, Social history, and Family history were reviewed and updated as appropriate.   Please see review of systems for further details on the patient's review from today.   Objective:   Physical Exam:  There were no vitals taken for this visit.  Physical Exam Constitutional:      General: She is not in acute distress. Neurological:     Mental Status: She is alert and oriented to person, place, and time.     Coordination: Coordination normal.     Gait: Gait normal.  Psychiatric:        Attention and Perception: Attention and perception normal.        Mood and Affect: Mood is anxious and depressed. Affect is not labile, blunt or tearful.        Speech: Speech is not rapid and pressured or slurred.        Behavior: Behavior is not slowed.        Thought Content: Thought  content is not paranoid or delusional. Thought content does not include homicidal or suicidal ideation. Thought content does not include suicidal plan.        Cognition and Memory: Cognition normal. Memory is impaired. She does not exhibit  impaired recent memory.        Judgment: Judgment normal.     Comments: Insight intact. No auditory or visual hallucinations. No delusions.  Depression ongoing residual. Anxiety is f situationally worse. Affect still depressed but less blunted than it was before Spravato.  Still blunted No Sui intent plan      Lab Review:     Component Value Date/Time   NA 137 01/12/2021 1430   NA 140 11/18/2018 1544   K 3.8 01/12/2021 1430   CL 108 01/12/2021 1430   CO2 22 01/12/2021 1430   GLUCOSE 94 01/12/2021 1430   BUN 14 01/12/2021 1430   BUN 20 11/18/2018 1544   CREATININE 0.82 01/12/2021 1430   CALCIUM 8.9 01/12/2021 1430   PROT 6.6 01/12/2021 1430   ALBUMIN 3.9 01/12/2021 1430   AST 12 (L) 01/12/2021 1430   ALT 11 01/12/2021 1430   ALKPHOS 46 01/12/2021 1430   BILITOT 0.5 01/12/2021 1430   GFRNONAA >60 01/12/2021 1430   GFRAA >60 09/02/2019 0249   GFRAA >60 01/27/2019 0811       Component Value Date/Time   WBC 4.5 01/12/2021 1430   RBC 4.32 01/12/2021 1430   HGB 12.8 01/12/2021 1430   HGB 12.9 07/17/2019 0953   HCT 38.5 01/12/2021 1430   HCT 21.9 (L) 12/25/2018 1221   PLT 272 01/12/2021 1430   PLT 286 07/17/2019 0953   MCV 89.1 01/12/2021 1430   MCH 29.6 01/12/2021 1430   MCHC 33.2 01/12/2021 1430   RDW 12.4 01/12/2021 1430   LYMPHSABS 1.4 01/12/2021 1430   MONOABS 0.4 01/12/2021 1430   EOSABS 0.0 01/12/2021 1430   BASOSABS 0.0 01/12/2021 1430    No results found for: "POCLITH", "LITHIUM"   No results found for: "PHENYTOIN", "PHENOBARB", "VALPROATE", "CBMZ"   .res Assessment: Plan:    Recurrent major depression resistant to treatment ([place]) - Plan: Nortriptyline level [[facility]]  Generalized anxiety disorder - Plan: Nortriptyline level [[facility]]  Attention deficit hyperactivity disorder (ADHD), predominantly inattentive type - Plan: amphetamine-dextroamphetamine (ADDERALL XR) 20 MG 24 hr capsule  Insomnia due to mental condition  Accelerated hypertension    She has treatment resistant major depression at this time.  Have  discussed some of her recent abnormal behaviors leading to this depressive episode getting worse which she says were associated with heavy use of delta 8 and not a manic episode.  She realizes now that that was not good for her.  She stopped all use of other drugs including those available over-the-counter such as delta 8 or any other THC related products.  She is no longer having any of those types of behaviors and instead is depressed.  She remains depressed with lack of interest and lack of feeling for things that normally she would have feelings about.  She has low motivation and energy.  She is difficulty making decisions.  She is sad and down.  She is less productive than usual.  Her concentration is poor.  She has high degree of anxiety as well.  Her personality is more flat than usual. She has a very negative self-image and low self-esteem.  These are all uncharacteristic for her However all of these symptoms are partially improved through Spravato and the switch from  Seroquel to olanzapine.  She is approximately 40 to 50% better.  Patient was administered Spravato 84 mg intranasally today.  The patient experienced the typical dissociation which gradually resolved over the 2-hour period of observation.  There were no complications.  Specifically the patient did not have nausea or vomiting or headache.  Blood pressures remained within normal ranges at the 40-minute and 2-hour follow-up intervals.  By the time the 2-hour observation period was met the patient was alert and oriented and able to exit without assistance.  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  See nursing note for further details.She wants to continue Spravato. We discussed discussed the side effects in detail as well as the protocol required to receive Spravato.   Failed  multiple antidepressants.  Many of them were not actual failures but intolerances and it is unclear whether some of that was more connected with anxiety than true side effects.  1 example is the [place].  In general she does not want to try anything but an antidepressant but has failed all major categories of antidepressants except TCAs and MAO inhibitors which have not been tried.  Objectively nor subjectively any different since stopping olanzapine.  In its place the next logical antidepressant would be to use a tricyclic antidepressant as she has not taken 1 of these medications before. Started nortriptyline 75 mg nightly.  need check blood level.  Discussed side effects in detail. Extensive discussion again her ambivalence about meds and missatributing sx of depression as SE of meds.    continue Wellbutrin XL to prior dose 450 mg AM  Started Spravato 84 mg twice weekly on 03/16/2022.  Now on weekly administration  Adderall  XR 20 mg AM  Ok temporary Ativan 1 mg 3-4 times daily as needed anxiety but try to cut it back. Is not ideal to use benzodiazepine with stimulant but because of the severity of her symptoms it has been necessary.  Hope to eventually eliminate the benzodiazepine.  Expected as her depression improves her anxiety will improve as well.  However lately her anxiety has been unmanageable.  We will expect that to improve as the depression improves.  She has headed insturctions to reduce this.  Continue clonidine 0.2 mg BID off label for anxiety and helps BP partially. BP is better controlled.  Discussed potential benefits, risks, and side effects of stimulants with patient to include increased heart rate, palpitations, insomnia, increased anxiety, increased irritability, or decreased appetite.  Instructed patient to contact office if experiencing any significant tolerability issues. She wants to return to usual dose of Adderall for ADD bc of mor poor cognitive function with  reduction.  Also discussed that depression will impair cognitive function.  [place] consultation was initially denied but she understands is being appealed if needed.  An appeal is ongoing at this time.  However because she is improving recently she wants to continue the current treatment plan.  Her symptoms have been very severe up until present but she is beginning to see more normal affect and her family notices the improvement as well.  Extensive discussion again about her chronic ambivalence about psychiatric medicines and tendency to blame medicine for the symptoms of depression that she had prior to even starting the medications.  After discussing this at length she was able to acknowledge that that is factual.  She does not appear to be having any significant side effects with the current medications.  Has Maintained sobriety  FU with Spravato weekly unless starts TMS then will stop it.  Lynder Parents, MD, DFAPA     Please see After Visit Summary for patient specific instructions.  No future appointments.           Orders Placed This Encounter  Procedures   Nortriptyline level [[facility]]       -------------------------------

## 2022-09-02 NOTE — Progress Notes (Signed)
NURSES NOTE:   Patient arrived for her 37 th Spravato treatment. Pt is being treated for Treatment Resistant Depression, pt will be receiving 84 mg which will continue to be her maintenance dose, she receives weekly treatments.  Pt is also planning on TMS as soon as insurance will approve her treatments, then she will stop Spravato at that time. Patient arrived and taken to treatment room. Confirmed she had a ride home which is her husband would be coming back to pick up pt when done and sometimes she needs to use [employer] if he is unable to pick her up. Pt's Spravato is ordered through local [facility] and delivered to office, all Spravato medication is stored at doctors office per REMS/FDA guidelines. The medication is required to be locked behind two doors per FDA/REMS Protocol. Medication is also disposed of properly per regulations.      Began taking patient's vital signs at 10:00 AM 147/95, pulse 68. Dr. Cottle agrees to proceed with her treatment today. B/P stable. Instructed patient to blow her nose then recline back to a 45 degree angle. Gave patient first dose 28 mg nasal spray, each nasal spray administered in each nostril as directed and waited 5 minutes between the second and third dose. All 3 doses given pt did not complain of any nausea/vomiting, given a cup of water due to the taste after the administration of Spravato.  She listens to [employer] with spa or relaxing music.  Checked 40 minute vitals at 10:40 AM, 147/94, pulse was 77. Explained she would be monitored for a total time of 120 minutes. Discharge vitals were taken at 12:00 PM 146/93 P 72. Dr. Cottle met with pt today and discussed her medication and her plans for TMS. I walked pt to elevator, where her husband met her for the ride home. Recommend she go home and sleep or just relax on the couch. No driving, no intense activities. Verbalized understanding. Nurse was with pt a total of 70 minutes for clinical. Pt is coming back in on  Monday, October 23rd. Pt instructed to call office with any problems or questions.      LOT [id number] EXP MAY 2025 

## 2022-09-03 ENCOUNTER — Encounter: Payer: 59 | Admitting: Psychiatry

## 2022-09-05 ENCOUNTER — Ambulatory Visit: Payer: 59

## 2022-09-05 ENCOUNTER — Other Ambulatory Visit: Payer: Self-pay | Admitting: Psychiatry

## 2022-09-05 ENCOUNTER — Encounter: Payer: Self-pay | Admitting: Psychiatry

## 2022-09-05 ENCOUNTER — Ambulatory Visit (INDEPENDENT_AMBULATORY_CARE_PROVIDER_SITE_OTHER): Payer: 59 | Admitting: Psychiatry

## 2022-09-05 VITALS — BP 128/83 | HR 81

## 2022-09-05 DIAGNOSIS — F5105 Insomnia due to other mental disorder: Secondary | ICD-10-CM | POA: Diagnosis not present

## 2022-09-05 DIAGNOSIS — F339 Major depressive disorder, recurrent, unspecified: Secondary | ICD-10-CM

## 2022-09-05 DIAGNOSIS — F900 Attention-deficit hyperactivity disorder, predominantly inattentive type: Secondary | ICD-10-CM

## 2022-09-05 DIAGNOSIS — F411 Generalized anxiety disorder: Secondary | ICD-10-CM | POA: Diagnosis not present

## 2022-09-05 DIAGNOSIS — I10 Essential (primary) hypertension: Secondary | ICD-10-CM

## 2022-09-05 NOTE — Progress Notes (Signed)
NURSES NOTE:   Patient arrived for her 41 th Spravato treatment. Pt is being treated for Treatment Resistant Depression, pt will be receiving 84 mg which will continue to be her maintenance dose, she receives weekly treatments.  Pt is also planning on [place] as soon as insurance will approve her treatments, then she will stop Spravato at that time. Patient arrived and taken to treatment room. Confirmed she had a ride home which is her husband would be coming back to pick up pt when done and sometimes she needs to use [employer] if he is unable to pick her up. Pt's Spravato is ordered through [employer] [employer] and delivered to office, all Spravato medication is stored at doctors office per REMS/FDA guidelines. The medication is required to be locked behind two doors per FDA/REMS Protocol. Medication is also disposed of properly per regulations.      Began taking patient's vital signs at 10:00 AM 170/113, pulse 67, left arm was 171/110, pulse 66. Obviously her B/P is too high to begin treatment, pt reports she feels so anxious and upset inside, she can't explain it. She reports its unsettling. Informed her I would turn on music and let her relax in chair for a bit then repeat B/P, Dr. Clovis Pu notified of how pt is feeling and her vital signs. After pt relaxed, 30 minutes, repeat 150/99, pulse 66. Dr. Clovis Pu agrees to proceed with her treatment today. Instructed patient to blow her nose then recline back to a 45 degree angle. Gave patient first dose 28 mg nasal spray, each nasal spray administered in each nostril as directed and waited 5 minutes between the second and third dose. All 3 doses given pt did not complain of any nausea/vomiting, given a cup of water due to the taste after the administration of Spravato.  She listens to [employer] with spa or relaxing music.  Checked 40 minute vitals at 11:15 AM, 133/82, pulse was 62. Explained she would be monitored for a total time of 120 minutes. Discharge vitals were taken at  12:20 PM 128/83 P 81. Dr. Clovis Pu met with pt today and discussed her medication and her plans for [place]. I walked pt to elevator, where her husband met her for the ride home. Recommend she go home and sleep or just relax on the couch. No driving, no intense activities. Verbalized understanding. Nurse was with pt a total of 70 minutes for clinical. Pt is coming back next week. Pt instructed to call office with any problems or questions.      LOT [id number] EXP AUG 2025

## 2022-09-05 NOTE — Progress Notes (Signed)
Laura Chang [id number] [birth year] 66 y.o.  Subjective:   Patient ID:  Laura Chang is a 66 y.o. (DOB 07/05/1956) female.  Chief Complaint:  Chief Complaint  Patient presents with   Follow-up   Depression   Anxiety   ADD     HPI Laura Chang presents to the office today for follow-up of depression and anxiety and ADD.  seen November 12, 2019.  Melted down in 2020.  Went to [employer] in July.  No withdrawal.  1 drink since then.  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 She was on no stimulant and no SSRI but was taking Strattera and Wellbutrin.  The following changes were made. Stop Strattera. OK restart stimulant bc severe ADD Restart Adderall 1 daily for a few days and if tolerated then restart 1 twice daily. If not tolerated reduce the dosage if needed. May need to stop Wellbutrin if not tolerating the stimulant.  Yes.  DC Wellbutrin Restart Prozac 20 mg daily.  February 2021 appointment with the following noted: Completed grant proposal.  Couldn't doit without Adderall.  Sold a bunch of work.   Adderall XR lasts about 3 pm.  Strength seems about right.  BP been OK.  Not jittery.   Stopped Wellbutrin but had no SE. Mood drastically better with grant proposal and back on fluoxetine.  Less depressed and lethargic.  No anxiety.  Cut back on coffee. Started back with devotions and stronger faith. Plan: Continue Prozac 20 mg daily. May have to increase the dose at some point in the future given that she usually was taking higher dosages but she is getting good response at this time. Restart Wellbutrin off label for ADD since can't get 2 ADDERALL daily. 150 mg daily then 300 mg daily. She can adjust the dose between 150 mg and 300 mg daily to get the optimal effect.   05/11/2020 appointment with the following noted: Has been inconsistent with Prozac and Wellbutrin. Not sure of the effect of Wellbutrin. Biggest deterrent in work is anxiety.   Some of the work is conceptual and difficult at times.  Can feel she's not up to a project at times.  Overall is OK but would like a steadier benefit from stimulant.  Exhausted from managing concentration and keeping up with things from the day.  Loses things.  Not good keeping up with schedule. Overall productive and emotionally OK. Can feel Adderall wear off. Mood is better in summer and worse in the winter.   F died in 18-Oct-2023 and that is a loss. No SE Wellbutrin. Still attends AA meetings.  Real benefit from Faith last year. Recognizes effect of anemia on ADD and mood.  Had iron infusions last winter. Plan:  Wellbutrin off label for ADD since can't get 2 ADDERALL daily. 150 mg daily then 300 mg daily.  01/24/2021 appointment with following noted: Doing a program called [employer] mindfulness app since Xmas.  CBT app helped the depression.  App helped her focus better.  Lost sign weight. Writing a lot. Before Xmas felt depressed and started negative thinking worse, self denigrating. Not drinking. More isolated.   Recognizes mo is narcissist.    Didn't tell anyone she was born until 3 mos later.  M aloof and uninterested in pt.  Lied about her birthday.  Mo lack of affection even with pt's kids. Going to [place] for a year and it helped her to quit drinking. Also misses kids being gone  with a hole also. Plan: No med changes  05/04/2021 appointment with the following noted: Therapist [name] thinks she's manic. Lost weight to 144#.   States she is still sleeping okay.  Admits she is hyper and recognizes that she is likely manic.  She feels great, euphoric with an increased sense of spiritual connectedness to God.  She has racing thoughts and talks fast and talks a lot and this is noted by her husband.  He thinks she is a bit hyper.  She has been able to maintain sobriety although she will have 1 glass of wine on special occasions but does not drink by herself.  She is not drinking to  excess.  She denies any dangerous impulsivity.  She is clearly not depressed and not particularly anxious.  She has no concerns about her medication and she has been compliant.  06/16/21 appt noted: So much better.  Going through a lot but the manic thing happened on top of it.  So much slower.  Didn't feel like losing anything with risperidone.  Likes the Adderalll at 10 mg. Some drowsiness in the AM and very drowsy from risperidone 2 mg HS. Prayer life is better. Handling stress better. Less depressed with risperidone. Still likes trazodone. Sleeps well. Plan: Reduce Prozac to 10 mg daily.  Consider stopping it because it can feel the mania however she is reluctant to do that because she fears relapse of depression. Reduce risperidone to 1.5 mg nightly due to side effects.  Discussed risk of worsening mania.  07/25/2021 appointment with the following noted: Misses the Adderall and hard to function without it. Depressed now. Heavy chest.  Anxious and guilty.  Body feels heavy.   Hates Wellbutrin.   Plan: Increase fluoxetine to 20 mg daily Add Abilify 1/2 of 15 mg tablet daily Wean wellbutrin by 1 tablet each week  bc she feels it is not helpful and DT polypharmacy Reduce risperidone to 1 daily for 1 week and stop it. Disc risk of mania. Increase Adderall to XR 20 mg AM  08/08/21 Much less depressed and starting to feel normal I feel a lot better. No SE.  Speech normal off risperidone. Sleeping OK on trazaodone and enough.   Noticed benefit from Adderall again. Plan: continue fluoxetine to 20 mg daily Continue Abilify 1/2 of 15 mg tablet daily for depression and mania continue Adderall to XR 20 mg AM  10/10/2021 phone call: Pt stated she feels like the Abilify should be decreased to 23m.She said she is depressed but rational and not suicidal.She has an appt Monday and can wait until then if you prefer. MD response: Reduce the Abilify to 7.5 mg every other day.  We will meet on 10/16/2021  and decide what to do from there.  10/16/2021 appointment with the following noted: More depressed.  Most depressed I've ever been.  Just numb.  Sense of grief.   Thinks the manic episode was unlike anything else she ever had.  Doesn't want to medicate against it.  Don't enjoy people.  Easily overwhelmed.  Had some death thoughts but not suicidal.  Has been functional.  Feels better today after reducing Abilify to every other day but she is only been doing that for 3 days. A/P: Episode of post manic depression was explained. continue fluoxetine to 20 mg daily Hold Abilify for 1 week then resume Abilify 1/2 of 15 mg tablet every other day for depression and mania continue Adderall to XR 20 mg AM  10/27/2021  appointment with the following noted: I'm doing so much better.  Handling the depression better. Better self talk and spiritual focus has helped.   Dep 6/10 manifesting as anxiety with low confidence.   F died 2  years ago and M 65 yo and is dependent . She is working hard to feel better but still feels depressed.  She almost feels like she has a little more anxiety since restarting Abilify every other day. Plan: continue fluoxetine to 20 mg daily DC Abilify .  Vrayalar 1.5 mg QOD to try to get rid of depression ASAP. continue Adderall to XR 20 mg AM  11/10/2021 appointment with the following noted: Busy with Xmas and it was fun with family but then a big let down.  Did well with it.  Functioned well with it.  Working hard on things with depression.  Not shutting down. Not sure but feels better today but yesterday was hard.  Difficulty dealing with mother.  She won't do anything to help herself.  Yesterday with her all day.  Won't do PT and has isolated herself.    Lack of confidence.   No SE with Vraylar.  11/24/21 urgent appointment appt noted: More and more depressed.   So anxious and doesn't want to be alone but can do so. No appetite. Hurts inside. Has had some fleeting suicidal  thoughts but would not act on them.  Tolerating meds. Has been consistent with Vraylar 1.5 mg every other day, fluoxetine 20 mg daily Plan: Increase Vraylar to 1.5 mg daily Change Prozac to Trintellix 10 mg daily. Discussed side effects of each continue Adderall to XR 20 mg AM  12/27/2021 appointment with the following noted: Not OK.  I feel less depressed but feels bat shit. Not sleeping well.  Extremely anxious. Off and on sleep. 3-4 hours of sleep.   Still having daily SI.  But also become obvious has so much to do.  Overwhelmed by tasks.   Needs anxiety meds to just function. Not more motivated.  Walked yesterday.   Feels afraid like in trouble but not irritable or angry. DC DT agitation Vraylar to 1.5 mg daily Change Prozac to Trintellix 10 mg daily. Hold Adderall to XR 20 mg AM Clonidine 0.1 1/2 tablet twice daily for 2 days and if needed for anxiety and sleep increase to 1 twice daily Ok temporary Ativan 1 mg 3 times daily as needed anxiety  01/05/22 appt noted: Off fluoxetine and  Trintellix.  Only on Ativan, trazodone and Adderall XR 20 plus added clonidine 0.1 mg BID Didn't think she needed to start Trintellix. Not taking Ativan.   Didn't like herself last week. Feels some better today. Wonders if the manic sx Not agitated.  Anxiety kind of calmed down.  A lot to be anxious about situationally.  $ stress. Concerns about downers with meds. Can't access normal personality. ? Lethargy and inability to talk as sE. Plan: Latuda 20-40 mg daily with food. Adderall to XR 20 mg AM Clonidine 0.1 1/2 tablet twice daily  reduce dose to be sure no SE Ok temporary Ativan 1 mg 3 times daily as needed anxiety  01/19/22 appt noted: Taking Latuda 20 mg daily.  Took 40 mg once and felt anxious and  SI Still depressed and not very reactive Anxiety mainly about the depression and fears of the future. She wants to revisit manic sx and thinks it was maybe bc taking delta 8 bc was taking a lot  of it so still  doesn't think she's classic bipolar. She wants to only take Prozac bc thinks Latuda is perpetuating depression. Says the delta 8 was very psychaedelic.  When not taking it was not manic.  Sleeping ok again.  Plan: Per her request DC Latuda 20-40 mg daily with food. She wants to continue Prozac alone AMA  Adderall to XR 20 mg AM Clonidine 0.1 1/2 tablet twice daily  reduce dose to be sure no SE Ok temporary Ativan 1 mg 3 times daily as needed anxiety  01/23/2022 phone call complaining of increased anxiety since stopping Latuda.  She will try increasing clonidine.  01/26/2022 phone call not feeling well and wanted to restart the Vraylar.  However notes indicate that had made her agitated therefore she was encouraged to pick up samples of Rexulti 1 mg and start that instead.  02/06/2022 phone call: Stating she felt the Rexulti was helping with depression but she was not sleeping well and obsessing over things.  She was encouraged to increase Rexulti to 2 mg daily and increase trazodone for sleep.  02/09/2022 appointment with the following noted: This was an urgent work in appointment No sleep last night with trazodone 100 mg HS Nothing really better depression or anxiety. Ruminating negative anxious thoughts. Did not tolerate Rexulti because it was causing insomnia.  Does not think it helped depression.  Lacks emotion that she should have.  Lacks her usual personality.  Some hopeless thoughts.  Some death thoughts.  Some suicidal thoughts without plan or intent Plan: DC Rexulti and Prozac & DC trazodone Adderall to XR 20 mg AM Clonidine 0.1 1/tablet twice daily  reduce dose to be sure no SE Ok temporary Ativan 1 mg 3 times daily as needed anxiety Start Seroquel XR 150 mg nightly  03/02/2022 appointment: Langley Gauss called back a few days after starting Seroquel stating it was making her more anxious and more depressed.  This seemed unlikely as this medicine rarely ever causes anxiety.   She stopped the medication waited 3 days and called back still had anxiety and depression but thought perhaps the anxiety was a little better.  She did not want to take the Seroquel. She knew about the option of Spravato and wanted to pursue that. Now questions whether to return to Seroquel while waiting to start Spravato bc feels just as bad without it and knows she didn't give it enough time to work.   MADRS 46  ECT-MADRS    Flowsheet Row Clinical Support from 08/06/2022 in [place] from 07/04/2022 in [place] from 05/21/2022 in [facility] Visit from 03/02/2022 in [facility]  MADRS Total Score _0 46      03/14/22 appt noted: Pt received Spravato 56 mg first dose today with some dissociative sx which were not severe.  She was anxious prior to the administration and felt better after receiving lorazepam 1 mg.  No NV, or HA. Wants to continue Spravato. Ongoing depression and desperate to feel better.  I'm not myself DT deprsssion which is most severe in recent history.  Anhedonia.  Low motivation.  Social avoidance. Continues to think all recent med trials are making her worse.  Sleep ok with Seroquel.  03/16/22 appt noted: Received Spravato 84 mg for the first time.  some dissociative sx which were not severe.  She was anxious prior to the administration and felt better after receiving lorazepam 1 mg.  No NV, or HA. Wants to continue Spravato.  Does not feel any better or different since the last appt.  Ongoing depression.  Ongoing depression and desperate to feel better.  I'm not myself DT deprsssion which is most severe in recent history.  Anhedonia.  Low motivation.  Social avoidance. Continues to think all recent med trials are making her worse.  Sleep ok with Seroquel.  Does not want to continue Seroquel for TRD.  03/20/2022 appointment noted: Came for Spravato  administration today.  However blood pressure was significantly elevated approximately 180/115.  She was given lorazepam 1 mg and clonidine 0.2 mg to try to get it down. She states she regretted stopping the Seroquel XR 300 mg tablets.  She now realizes it was helpful.  She did not sleep much at all last night.  She did not take the Adderall this morning. 2 to 3 hours after arrival blood pressure was still elevated at  170/110, 62 pulse.  For Spravato administration was canceled for today.  She admits to being anxious and depressed.  She is not suicidal.  She is highly motivated to receive the Spravato.  We discussed getting it tomorrow.  03/22/2022 appointment noted: Patient's blood pressure was never stable enough yesterday in order to get her in for Spravato administration.  She was encouraged to see her primary care doctor.  It is better today.  03/26/2022 appointment with the following noted: Blood pressure was better.  Saw her primary care doctor who started on oral Bystolic 2.5 mg daily. Received Spravato 84 mg today as scheduled.  Tolerated it well without nausea or vomiting headache or chest pain or palpitations.  Her blood pressure was borderline but manageable. She remains depressed and anxious.  She is ambivalent about the medicine and desperate to get to feel better.  Continues to have anhedonia and low energy and low motivation and reduced ability to do things.  Less social.  Not suicidal.  03/28/22 appt noted: Received Spravato 84 mg today as scheduled.  Tolerated it well without nausea or vomiting headache or chest pain or palpitations.  Her blood pressure was borderline but manageable. Has not seen any improvement so far.  Tolerating Seroquel.  Inconsistent with Bystolic and BP has been borderline high. Still depressed and anxious and anhedonia.  Low motivation, energy, productivity. Taking quetiapine and tolerating XR 300 mg nightly.  04/04/22 appt noted: Received Spravato 84 mg  today as scheduled.  Tolerated it well without nausea or vomiting headache or chest pain or palpitations.  Her blood pressure was borderline but manageable. Has not seen any improvement so far.  Tolerating Seroquel.   She still tends to think that the medications are making her worse.  She has said this about each of the recent psychiatric medicines including Seroquel.  However her husband thinks she is improved.  She also admits there is some improvement in productivity.  She still feels highly anxious.  She still does not enjoy things as normal.  She still feels desperate to improve as soon as possible. Has been taking Seroquel XR since 03/20/2022  04/10/22 appt noted: Received Spravato 84 mg today as scheduled.  Tolerated it well without nausea or vomiting headache or chest pain or palpitations.  Her blood pressure was borderline but manageable. Has not seen any improvement so far.  Tolerating Seroquel.  Doesn't like Seroquel bc she thinks it flattens here. 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  Tolerated it well without nausea or vomiting headache or chest pain or palpitations.  Her blood pressure was borderline but manageable. Has not seen any improvement so far.  Tolerating Seroquel.  Doesn't like Seroquel bc she thinks it flattens her. Received Spravato 84 mg today as scheduled.  Tolerated it well without nausea or vomiting headache or chest pain or palpitations.  Her blood pressure was borderline but manageable. Has not seen any improvement so far.  Tolerating Seroquel.  Doesn't like Seroquel bc she thinks it flattens here.  We discussed her ambivalence about it. She is starting Auvelity and has tolerated it the last 2 days without side effect.  She still does not feel like herself and feels flat and not enjoying things  with suppressed expressed emotion  04/17/2022 appointment with the following noted: Received Spravato 84 mg today as scheduled.  Tolerated it well without nausea or vomiting headache or chest pain or palpitations.  Her blood pressure was borderline but manageable. Has not seen any improvement so far.  Tolerating Seroquel.  Doesn't like Seroquel bc she thinks it flattens her. She has been tolerating the Auvelity 1 in the morning without side effects for about a week.  She has not noticed significant improvement so far.  She still feels depressed and flat and not herself.  Other people notice that she is flat emotionally.  She is not suicidal.  She does feel discouraged that she is not getting better yet.  04/19/2022 appointment noted: Has increased Auvelity to 1 twice daily for 2 days, continues quetiapine XR 300 mg nightly, clonidine 0.3 mg twice daily, lorazepam 1 mg twice daily for anxiety and Adderall XR 20 mg in the morning. No obious SE but she still thinks quetiapine XR is making her feel down.  But not sedated Received Spravato 84 mg today as scheduled.  Tolerated it well without nausea or vomiting headache or chest pain or palpitations.  Her blood pressure was borderline but manageable. She still feels quite anxious and feels it necessary to take both the clonidine and lorazepam twice a day to manage her anxiety.  She has been consistently down and flat and not herself until yesterday afternoon she noted an improvement in mood and feeling much more like herself with her normal personality reemerging.  She was quite depressed in the morning with very dark negative thoughts.  She did not have those dark negative thoughts this morning.  She had a lot of questions about medication and when she was expecting to be improved and why she has not shown improvement up to now.  04/23/22 appt noted: Has increased Auvelity to 1 twice daily for 1 week, continues quetiapine XR 300 mg nightly, clonidine 0.3 mg  twice daily, lorazepam 1 mg twice daily for anxiety and Adderall XR 20 mg in the morning. No obious SE but she still thinks quetiapine XR is making her feel down.  But not sedated Received Spravato 84 mg today as scheduled.  Tolerated it well without nausea or vomiting headache or chest pain or palpitations.  She is still depressed but admits better function and is able to enjoy social interactions. Tolerating meds.  Would like to feel better for sure. Not herself.  Flat. Plan increase Auvelity to 1 tab BID as planned and reduce Quetiapine to 1/2 of ER 300 mg  bc NR for depression.  04/25/2022 appointment with the following noted: clonidine 0.3 mg twice daily, lorazepam 1 mg twice daily for anxiety and Adderall XR  20 mg in the morning. Seroquel XR 300 HS No obious SE but she still thinks quetiapine XR is making her feel down.  But not sedated Received Spravato 84 mg today as scheduled.  Tolerated it well without nausea or vomiting headache or chest pain or palpitations.  Called yesterday with more anxiety.  Had increased Auvelity for 1 day and reduced Seroquel XR for 1 day.  Felt restless and fearful  05/01/2022 appointment noted: clonidine 0.3 mg twice daily, lorazepam 1 mg twice daily for anxiety and Adderall XR 20 mg in the morning. Seroquel XR 150 HS, Auvelity 1 BID Received Spravato 84 mg today as scheduled.  Tolerated it well without nausea or vomiting headache or chest pain or palpitations.  Nurse has noted patient has called multiple times sometimes asking the same question repeatedly.  It is unclear whether she is truly forgetful or is just anxious seeking reassurance. Patient acknowledges ongoing depression as well as some anxiety but states she has felt a little better in the last couple of days.  She has reduced the Seroquel to 150 mg at night and has increased Auvelity to 1 twice daily but only for 1 day.  So far she seems to be tolerating it.  05/03/22 appt noted: clonidine 0.2 mg  twice daily, lorazepam 1 mg twice daily for anxiety and Adderall XR 20 mg in the morning. Seroquel XR 150 HS, Auvelity 1 BID BP high this am about 170/100 and received extra clonidine 0.2 mg and came to receive Spravato.  Not dizzy, no SOB, nor CP but BP is still high Could not receive Spravato today bc BP high and pulse low at 30 ppm. Still depressed and anxious. Plan: continue trial Auvelity with Spravato She needs to get BP and pulse managed  05/08/22 TC: RTC  H Michael NA and mailbox full.  Could not leave message.  Pt  -  talked to she and H on speaker. H worried over wife.  Vacant stare.  Slurs words at times.  Not smiling. Reduced enjoyment.  Depression.  Withdrawn from usual activities.  Some irritability.  Anxious. Disc her concerns meds are making her worse.  Extensive discussion about her treatment resistant status.  There is a consistent pattern of not taking the medicines long enough to get benefit because she believes the meds are making her worse.  However the symptoms she describes as side effects are exactly the same symptoms that she had prior to taking the medication RX for  the depression.  So it is not clear that these are actual side effects. This is true about the 2 most recent meds including Seroquel and Auvelity.  Recommend psychiatric consultation in hopes of improving her comfort level with taking prescribed medications for a sufficient length of time to provide benefit. Extensive discussion about ECT is the treatment of choice for treatment resistant depression.  Spravato may work if she can comply with consistency.  There are medication options but they take longer to work.   Plan:  Reduce clonidine to 0.1 mg BID DT bradycardia.  Talk with PCP about BP and low pulse problems which are interfering with her consistent compliance with Spravato.   Limit lorazepam to 3 -4  mg daily max. Excess use is the cause of slurring speech.  She must stop excess use or will have to stop  the med. Stop Auvelity per her request.  But she has only been on the full dose for a little over a week and clearly has  not had time to get benefit from it.  She thinks maybe it is making her more anxious. Reduce Seroquel from 150XR to 50 -100 mg at night IR.  She couldn't sleep when stopped it completely. Will not start new antidepressant until her SE issues are resolved or not. Get second psych opinion from Yehuda Budd MD or another psychiatrist.  H's sister is therapist in [place] [facility], MD, [facility]  05/16/2022 appointment with the following noted: Received Spravato 84 mg today as scheduled.  Tolerated it well without nausea or vomiting headache or chest pain or palpitations.  She stopped Auvelity as discussed last week. On her own, without physician input, she restarted Wellbutrin XL 450 mg every morning today.  She had taken it in the past.  She feels jittery and anxious. She feels less depressed than she did last week.  But she is still depressed without her usual range of affect.  She still is less social and less motivated than normal. Her primary care doctor increased the dose of losartan Plan: Stop Seroquel Reduce Wellbutrin XL to 300 mg every morning.  Starting the dose at 450 every morning is likely causing side effects of jitteriness and it should not be started at that have a dosage. Recommend she not change meds on her own without MDM put  05/23/2022 appointment with the following noted: Received Spravato 84 mg today as scheduled.  Tolerated it well without nausea or vomiting headache or chest pain or palpitations.  Has not dropped seroquel XR 300 mg 1/2 tablet nightly bc couldn't sleep without it. Has not tried lower dose quetiapine 50 mg HS Still feels depressed.   BP is better managed so far, just saw PCP.  BP is better today and infact is low today. Dropped clonidine as directed from 0.3 mg BID bc inadequate control of BP to 0.2 mg BID.  However she wants to increase it  back to 0.3 mg twice daily because she feels it helped her anxiety better.  Wonders about increasing Wellbutrin for depression.  However she has only been on 300 mg a day for a week.  She was on 450 mg daily in the past.  06/06/22 appt noted: Received Spravato 84 mg today as scheduled.  Tolerated it well without nausea or vomiting headache or chest pain or palpitations.  She is still depressed and anxious.  She wants to try to stop the Seroquel but cannot sleep without some of it.  She is taking lorazepam 1 mg 4 times daily and still having a lot of anxiety.  She wants to increase clonidine back to 0.3 mg twice daily.  She hopes for more improvement She recently went for a second psychiatric opinion as suggested the results of that are pending.  06/11/22 appt noted: Received Spravato 84 mg today as scheduled.  Tolerated it well without nausea or vomiting headache or chest pain or palpitations.  She is still depressed and anxious. Without much change.  Still hopeless, anhedonia, reduced inteterest and motivation.  Tolerating meds. Disc concerns Spravato is not hleping much. Plan: stop Seroquel and start olanzapine 10 mg HS for TRD and anxiety.  06/13/2022 appointment noted: Received Spravato 84 mg today as scheduled.  Tolerated it well without nausea or vomiting headache or chest pain or palpitations.  She is still depressed and anxious. Without much change.  Still hopeless, anhedonia, reduced inteterest and motivation.  Tolerating meds. Disc concerns Spravato is not helping much as hoped but is improving a bit in the  last week. Tolerating meds. Continues Wellbutrin XL 450 AM, tolerating recently started olanzapine  10 mg HS. Sleep is good.   Pending appt with [place] consult.  06/18/22 appt noted: Received Spravato 84 mg today as scheduled.  Tolerated it well without nausea or vomiting headache or chest pain or palpitations.  Tolerating meds. Continues Wellbutrin XL 450 AM, tolerating recently started  olanzapine  10 mg HS. Continues Adderall XR 20 amd and has tried to reduce lorazepam to 46m TID Sleep is good.   [place] [facility] [facility]. Depression is a little bit better in the last week with a little improvement in emotional expression and interest.  She is pushing herself to be more active.  Her daughter thought she was a little better than she has been.  However she is still depressed and still not her normal self with anhedonia and reduced emotional expressiveness.  06/20/22 appt noted: Received Spravato 84 mg today as scheduled.  Tolerated it well without nausea or vomiting headache or chest pain or palpitations.  Tolerating meds with a little sleepiness. Continues Wellbutrin XL 450 AM, tolerating recently started olanzapine  10 mg HS. Continues Adderall XR 20 amd and has tried to reduce lorazepam to 165mTID Sleep is good.   Mood is improving.  Better funciton.  Anxiety is better with olanzapine. Still not herself and depression not gone with some anhedonia and social avoidance and feeling overwhelmed.  8/14 2023 received Spravato 84 mg 06/27/2022 received Spravato Spravato 84 mg 07/02/2022 received Spravato 84 mg 07/04/2022 received Spravato 84 mg  07/09/2022 appointment noted: Received Spravato 84 mg today as scheduled.  Tolerated it well without nausea or vomiting headache or chest pain or palpitations.  Expected dissociation and feels less depressed with resolution of negative emotions immediately after Spravato and then depression, anxiety creep back in. Continues meds Adderall XR 20 mg every morning, Wellbutrin XL 450 every morning, clonidine 0.1 mg twice daily, lorazepam 1 mg every 6 hours as needed, olanzapine increased from 7.5 to 10 mg nightly on  Tolerating meds.  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  She does note some increased appetite and is  somewhat concerned about that but has not gained significant amounts of weight. [place] which was initially [place] but she knows it can be appealed.  However because she is improving with Spravato plus the other medications now she wants to continue the current treatment plan.  07/18/22 appt noted: Continues meds Adderall XR 20 mg every morning, Wellbutrin XL 450 every morning, clonidine 0.1 mg twice daily, lorazepam 1 mg every 6 hours as needed, olanzapine increased from 7.5 to 10 mg nightly on 07/04/2022. Received Spravato 84 mg today as scheduled.  Tolerated it well without nausea or vomiting headache or chest pain or palpitations.  Expected dissociation and feels less depressed with resolution of negative emotions immediately after Spravato and then depression, anxiety creep back in. Continues meds Adderall XR 20 mg every morning, Wellbutrin XL 450 every morning, clonidine 0.1 mg twice daily, lorazepam 1 mg every 6 hours as needed, olanzapine increased from 7.5 to 10 mg nightly on  Tolerating meds.  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  She does note some increased appetite and is somewhat concerned about that  but has not gained significant amounts of weight. She has had the [place] consultation which was initially denied but she knows it can be appealed. She continues to have chronic ambivalence about psychiatric medicines and initially tends to blame her depressive symptoms such as decreased concentration and feeling flat on what ever medicine she currently is taking even though she had the same symptoms before the current medicines were started.  Then after discussion she does admit that her depressive symptoms are improved since adding olanzapine but still has those residual symptoms noted.  07/23/22 received Spravato 84 mg   07/30/2022 appointment  noted: Received Spravato 84 mg today as scheduled.  Tolerated it well without nausea or vomiting headache or chest pain or palpitations.  Expected dissociation and feels less depressed with resolution of negative emotions immediately after Spravato and then depression, anxiety creep back in. Continues meds Adderall XR 20 mg every morning, Wellbutrin XL 450 every morning, clonidine 0.1 mg twice daily, lorazepam 1 mg every 6 hours as needed, olanzapine increased from 7.5 to 10 mg nightly on  She has been inconsistent with olanzapine because she continues to be ambivalent about the medications in general and thinks that perhaps the 10 mg is making her feel blunted.  She continues to feel some depression.  She had a good day this week and but still feels somewhat depressed and persistently anxious. Plan: be consistent with olanzapine 10 mg HS for TRD and longer trial for potential benefit for anxiety.  Has not taken it consistently.  08/06/2022 appointment noted: Received Spravato 84 mg today as scheduled.  Tolerated it well without nausea or vomiting headache or chest pain or palpitations.  Expected dissociation and feels less depressed with resolution of negative emotions immediately after Spravato and then depression, anxiety creep back in. Continues meds Adderall XR 20 mg every morning, Wellbutrin XL 450 every morning, clonidine 0.1 mg twice daily, lorazepam 1 mg every 6 hours as needed, olanzapine i 10 mg nightly  She continues to feel depressed but is about 50% better with Spravato.  She is still not herself.  She still has anhedonia.  She still is not her able to engage socially in the typical ways.  She is not jovial and outgoing like normal.  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  She continues to feel that her personality is dampened down.  There is a question about whether it is  related to depression or medication. Plan: continue olanzapine 10 for longer trial for TRD and severe anxiety.  08/13/22 appt noted:  Received Spravato 84 mg today as scheduled.  Tolerated it well without nausea or vomiting headache or chest pain or palpitations.  Expected dissociation and feels less depressed with resolution of negative emotions immediately after Spravato and then depression, anxiety creep back in. Continues meds Adderall XR 20 mg every morning, Wellbutrin XL 450 every morning, clonidine 0.1 mg twice daily, lorazepam 1 mg every 6 hours as needed, olanzapine i 10 mg nightly  She still does not feel herself.  Still struggles with depression and low motivation and reduced social engagement and reduced interest and reduced emotional expression.  She is somewhat better with the medicines plus Spravato.  She still believes the Spravato makes her blunted and is not sure how much it helps her anxiety.  She can have good days when her family is around and  she is engaged.  She still wants to stop the olanzapine. She has apparently continued to take the trazodone despite having been told to stop it when she started olanzapine.  She feels like she needs the trazodone. Plan: DC olanzapine and Start nortriptyline 25 mg nightly and build up to 75 mg nightly and then check blood level.    08/27/2022 appointment noted: Received Spravato 84 mg today as scheduled.  Tolerated it well without nausea or vomiting headache or chest pain or palpitations.  Expected dissociation and feels less depressed with resolution of negative emotions immediately after Spravato and then depression, anxiety creep back in. Continues meds Adderall XR 20 mg every morning, Wellbutrin XL 450 every morning, clonidine 0.1 mg twice daily, lorazepam 1 mg every 6 hours as needed. Stopped olanzapine and started nortriptyline which she has taken for about a week is 75 mg nightly. So far she is tolerating the nortriptyline well with the  exception of some dry mouth and constipation which she is working to manage.  She does not feel substantially better better or different off the olanzapine.  No change in her sleep which is good.  Main concern currently in addition to the residual depression is anxiety which is somewhat situational with pending arch show.  She is worrying about it more than normal.  Says she is having to take lorazepam twice a day where she had been able to keep reduce it prior to this.  She still does not feel like herself with residual depression with less social interest and less of her usual buoyancy in personality.  She is flatter than normal.  Overall she still feels that the Spravato has been helpful at reducing the severity of the depression.  She is not suicidal. She has not heard anything about the [place] appeal as of yet.  09/05/2022 appointment noted: Received Spravato 84 mg today as scheduled.  Tolerated it well without nausea or vomiting headache or chest pain or palpitations.  Expected dissociation and feels less depressed with resolution of negative emotions immediately after Spravato and then depression, anxiety creep back in. Continues meds Adderall XR 20 mg every morning, Wellbutrin XL 450 every morning, clonidine 0.1 mg twice daily, lorazepam 1 mg every 6 hours as needed. Stopped olanzapine and started nortriptyline which she has taken for about 2 week is 75 mg nightly. Initially blood pressure was a little high causing delay in starting Spravato.  She admitted to feeling a little wound up.  She still experiences a little increase in depression if she goes longer than a week in between doses of Spravato.  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  ECT-MADRS    Flowsheet Row Clinical Support from 08/06/2022 in [place] from 07/04/2022 in [place] from 05/21/2022 in [place] Visit from 03/02/2022 in [facility]  MADRS Total Score _0 46        Past Psychiatric Medication Trials: fluoxetine, duloxetine, Viibryd, lamotrigine, Pristiq, sertraline, citalopram,  Trintellix anxious and SI Wellbutrin XL 450 [place] 1 dose  Adderall, Adderall XR, Vyvanse, Ritalin, Strattera low dose NR Lorazepam Trazodone  Depakote,  lamotrigine cog complaints Lithium remotely Abilify 7.5  Vraylar 1.5 mg daily agitation and insomnia Rexulti insomnia Latuda 40 one dose, CO anxious and SI Seroquel XR 300 Olanzapine 10  At visit November 12, 2019. We discussed Patient developed an increasingly severe alcohol dependence problem since her last visit in January.  She went to [facility] and has had no alcohol since then except 1 day.  She never abused stimulants but they took her off the stimulants at [facility].  Her ADD was markedly worse.  The Wellbutrin did not help the ADD.   D history lamotrigine rash at 66 yo  Review of Systems:  Review of Systems  Constitutional:  Positive for fatigue.  Cardiovascular:  Negative for palpitations.  Musculoskeletal:  Positive for arthralgias and back pain. Negative for joint swelling.       SP hip surgery October 2020  Neurological:  Negative for dizziness, tremors and headaches.  Psychiatric/Behavioral:  Positive for decreased concentration and dysphoric mood. Negative for agitation, behavioral problems, confusion, hallucinations, self-injury, sleep disturbance and suicidal ideas. The patient is nervous/anxious. The patient is not hyperactive.     Medications: I have reviewed the patient's current medications.  Current Outpatient Medications  Medication Sig Dispense Refill   amLODipine (NORVASC) 2.5 MG tablet Take 2.5 mg by mouth daily.     amphetamine-dextroamphetamine (ADDERALL XR) 20 MG 24 hr capsule Take 1 capsule (20 mg total)  by mouth every morning. 30 capsule 0   amphetamine-dextroamphetamine (ADDERALL XR) 20 MG 24 hr capsule Take 1 capsule (20 mg total) by mouth every morning. 30 capsule 0   buPROPion (WELLBUTRIN XL) 150 MG 24 hr tablet TAKE 3 TABLETS BY MOUTH DAILY 270 tablet 1   cloNIDine (CATAPRES) 0.2 MG tablet Take 1 tablet (0.2 mg total) by mouth 2 (two) times daily. 60 tablet 1   Esketamine HCl, 84 MG Dose, (SPRAVATO, 84 MG DOSE,) 28 MG/DEVICE SOPK USE 3 SPRAYS IN EACH NOSTRIL TWICE A WEEK 3 each 3   iron polysaccharides (NIFEREX) 150 MG capsule TAKE 1 CAPSULE BY MOUTH EVERY DAY 90 capsule 1   LORazepam (ATIVAN) 1 MG tablet Take 1 tablet (1 mg total) by mouth every 8 (eight) hours as needed. for anxiety 90 tablet 0   losartan (COZAAR) 50 MG tablet Take 50 mg by mouth daily.     nebivolol (BYSTOLIC) 2.5 MG tablet Take 2.5 mg by mouth daily.     nortriptyline (PAMELOR) 25 MG capsule 1 at night for 5 nights then 2 at night for 5 nights then 3 nightly (Patient taking differently: Take 75 mg by mouth at bedtime.) 90 capsule 0   traZODone (DESYREL) 50 MG tablet 1-2 tablets nightly as needed for sleep 60 tablet 1   No current facility-administered medications for this visit.    Medication Side Effects: None  Allergies:  Allergies  Allergen Reactions   Metronidazole Shortness Of Breath and Other (See Comments)    Heart pounding   Ferrlecit [Na Ferric Gluc Cplx In Sucrose] Other (See Comments)    Infusion reaction 05/12/2019    Past Medical History:  Diagnosis Date   ADHD    Anemia    Anxiety    Arthritis    Depression    Heart murmur    i went to see a cardiologit slast eyar  and i had zero plaque,    PONV (postoperative nausea and vomiting)    Recovering alcoholic in remission ([place])     Family History  Problem Relation Age of Onset   Atrial fibrillation Mother    CAD Father     Past Medical History, Surgical history, Social history, and Family history were reviewed and updated as appropriate.  Please see review of systems for further details on the patient's review from today.   Objective:   Physical Exam:  There were no vitals taken for this visit.  Physical Exam Constitutional:      General: She is not in acute distress. Neurological:     Mental Status: She is alert and oriented to person, place, and time.     Coordination: Coordination normal.     Gait: Gait normal.  Psychiatric:        Attention and Perception: Attention and perception normal.        Mood and Affect: Mood is anxious and depressed. Affect is not labile, blunt, angry or tearful.        Speech: Speech is not rapid and pressured or slurred.        Behavior: Behavior is not slowed.        Thought Content: Thought content is not paranoid or delusional. Thought content does not include homicidal or suicidal ideation. Thought content does not include suicidal plan.        Cognition and Memory: Cognition normal. Memory is impaired. She does not exhibit impaired recent memory.        Judgment: Judgment normal.     Comments: Insight intact. No auditory or visual hallucinations. No delusions.  Depression ongoing residual. Anxiety is situationally worse. Affect still depressed but less blunted than it was before Spravato.  Still blunted No [place] intent plan      Lab Review:     Component Value Date/Time   NA 137 01/12/2021 1430   NA 140 11/18/2018 1544   K 3.8 01/12/2021 1430   CL 108 01/12/2021 1430   CO2 22 01/12/2021 1430   GLUCOSE 94 01/12/2021 1430   BUN 14 01/12/2021 1430   BUN 20 11/18/2018 1544   CREATININE 0.82 01/12/2021 1430   CALCIUM 8.9 01/12/2021 1430   PROT 6.6 01/12/2021 1430   ALBUMIN 3.9 01/12/2021 1430   AST 12 (L) 01/12/2021 1430   ALT 11 01/12/2021 1430   ALKPHOS 46 01/12/2021 1430   BILITOT 0.5 01/12/2021 1430   GFRNONAA >60 01/12/2021 1430   GFRAA >60 09/02/2019 0249   GFRAA >60 01/27/2019 0811       Component Value Date/Time   WBC 4.5 01/12/2021 1430   RBC 4.32  01/12/2021 1430   HGB 12.8 01/12/2021 1430   HGB 12.9 07/17/2019 0953   HCT 38.5 01/12/2021 1430   HCT 21.9 (L) 12/25/2018 1221   PLT 272 01/12/2021 1430   PLT 286 07/17/2019 0953   MCV 89.1 01/12/2021 1430   MCH 29.6 01/12/2021 1430   MCHC 33.2 01/12/2021 1430   RDW 12.4 01/12/2021 1430   LYMPHSABS 1.4 01/12/2021 1430   MONOABS 0.4 01/12/2021 1430   EOSABS 0.0 01/12/2021 1430   BASOSABS 0.0 01/12/2021 1430    No results found for: "POCLITH", "LITHIUM"   No results found for: "PHENYTOIN", "PHENOBARB", "VALPROATE", "CBMZ"   .res Assessment: Plan:    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  Have  discussed some of her recent abnormal behaviors leading to this depressive episode getting worse which she says were associated with heavy use of delta 8 and not a manic episode.  She realizes now that that was not good for her.  She stopped all use  of other drugs including those available over-the-counter such as delta 8 or any other THC related products.  She is no longer having any of those types of behaviors and instead is depressed.  She remains depressed with lack of interest and lack of feeling for things that normally she would have feelings about.  She has low motivation and energy.  She is difficulty making decisions.  She is sad and down.  She is less productive than usual.  Her concentration is poor.  She has high degree of anxiety as well.  Her personality is more flat than usual. She has a very negative self-image and low self-esteem.  These are all uncharacteristic for her However all of these symptoms are partially improved through Spravato and the switch from Seroquel to olanzapine.  She is approximately 40 to 50%  better.  Patient was administered Spravato 84 mg intranasally today.  The patient experienced the typical dissociation which gradually resolved over the 2-hour period of observation.  There were no complications.  Specifically the patient did not have nausea or vomiting or headache.  Blood pressures remained within normal ranges at the 40-minute and 2-hour follow-up intervals.  By the time the 2-hour observation period was met the patient was alert and oriented and able to exit without assistance.  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  See nursing note for further details.She wants to continue Spravato. We discussed discussed the side effects in detail as well as the protocol required to receive Spravato.   Failed multiple antidepressants.  Many of them were not actual failures but intolerances and it is unclear whether some of that was more connected with anxiety than true side effects.  1 example is the [place].  In general she does not want to try anything but an antidepressant but has failed all major categories of antidepressants except TCAs and MAO inhibitors which have not been tried.  Objectively nor subjectively any different since stopping olanzapine.  In its place the next logical antidepressant would be to use a tricyclic antidepressant as she has not taken 1 of these medications before. Started nortriptyline 75 mg nightly.  need check blood level as soon as convenient.  Discussed side effects in detail. Extensive discussion again her ambivalence about meds and missatributing sx of depression as SE of meds.    continue Wellbutrin XL 450 mg AM  Started Spravato 84 mg twice weekly on 03/16/2022.  Now on weekly administration  Adderall  XR 20 mg AM  Ok temporary Ativan 1 mg 3-4 times daily as needed anxiety but try to cut it back. Is not ideal to use benzodiazepine with stimulant but because of  the severity of her symptoms it has been necessary.  Hope to eventually eliminate the benzodiazepine.  Expected as her depression improves her anxiety will improve as well.  However lately her anxiety has been unmanageable.  We will expect that to improve as the depression improves.  She has headed insturctions to reduce this.  Continue clonidine 0.2 mg BID off label for anxiety and helps BP partially. BP is better controlled but not consistent.  Consider increasing amlodipine.  Also on losartan 50  Discussed potential benefits, risks, and side effects of stimulants with patient to include increased heart rate, palpitations, insomnia, increased anxiety, increased irritability, or decreased appetite.  Instructed patient to contact office if experiencing any significant tolerability issues. She wants to return to usual dose of  Adderall for ADD bc of mor poor cognitive function with reduction.  Also discussed that depression will impair cognitive function.  [place] consultation was initially denied but she understands is being appealed if needed.  An appeal is ongoing at this time.  However because she is improving recently she wants to continue the current treatment plan.  Her symptoms have been very severe up until present but she is beginning to see more normal affect and her family notices the improvement as well.  Extensive discussion again about her chronic ambivalence about psychiatric medicines and tendency to blame medicine for the symptoms of depression that she had prior to even starting the medications.  After discussing this at length she was able to acknowledge that that is factual.  She does not appear to be having any significant side effects with the current medications.  Has Maintained sobriety  FU with Spravato weekly unless starts TMS then will stop it.  Lynder Parents, MD, DFAPA     Please see After Visit Summary for patient specific instructions.  Future Appointments  Date Time  Provider Deary  09/10/2022  9:30 AM Cottle, Billey Co., MD CP-CP None  09/10/2022  9:30 AM CP-NURSE CP-CP None             No orders of the defined types were placed in this encounter.      -------------------------------

## 2022-09-08 LAB — NORTRIPTYLINE LEVEL: Nortriptyline Lvl: 176 mcg/L — ABNORMAL HIGH (ref 50–150)

## 2022-09-10 ENCOUNTER — Ambulatory Visit: Payer: 59

## 2022-09-10 ENCOUNTER — Encounter: Payer: 59 | Admitting: Psychiatry

## 2022-09-10 ENCOUNTER — Ambulatory Visit (INDEPENDENT_AMBULATORY_CARE_PROVIDER_SITE_OTHER): Payer: 59 | Admitting: Psychiatry

## 2022-09-10 VITALS — BP 119/80 | HR 70

## 2022-09-10 DIAGNOSIS — F5105 Insomnia due to other mental disorder: Secondary | ICD-10-CM

## 2022-09-10 DIAGNOSIS — F339 Major depressive disorder, recurrent, unspecified: Secondary | ICD-10-CM

## 2022-09-10 DIAGNOSIS — F900 Attention-deficit hyperactivity disorder, predominantly inattentive type: Secondary | ICD-10-CM

## 2022-09-10 DIAGNOSIS — F411 Generalized anxiety disorder: Secondary | ICD-10-CM | POA: Diagnosis not present

## 2022-09-10 DIAGNOSIS — I10 Essential (primary) hypertension: Secondary | ICD-10-CM

## 2022-09-10 MED ORDER — PRAMIPEXOLE DIHYDROCHLORIDE 0.25 MG PO TABS: 0.2500 mg | ORAL_TABLET | Freq: Two times a day (BID) | ORAL | 0 refills | Status: DC

## 2022-09-10 MED ORDER — LORAZEPAM 1 MG PO TABS: 1.0000 mg | ORAL_TABLET | Freq: Three times a day (TID) | ORAL | 0 refills | Status: DC | PRN

## 2022-09-11 ENCOUNTER — Encounter: Payer: Self-pay | Admitting: Psychiatry

## 2022-09-11 ENCOUNTER — Other Ambulatory Visit: Payer: Self-pay | Admitting: Psychiatry

## 2022-09-11 NOTE — Progress Notes (Signed)
Laura Chang [id number] 1956/08/01 66 y.o.  Subjective:   Patient ID:  Laura Chang is a 66 y.o. (DOB Feb 15, 1956) female.  Chief Complaint:  Chief Complaint  Patient presents with   Follow-up   Depression   Anxiety   Fatigue   ADD     HPI [name] presents to the office today for follow-up of depression and anxiety and ADD.  seen November 12, 2019.  Melted down in 2020.  Went to [employer] in July.  No withdrawal.  1 drink since then.  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 She was on no stimulant and no SSRI but was taking Strattera and Wellbutrin.  The following changes were made. Stop Strattera. OK restart stimulant bc severe ADD Restart Adderall 1 daily for a few days and if tolerated then restart 1 twice daily. If not tolerated reduce the dosage if needed. May need to stop Wellbutrin if not tolerating the stimulant.  Yes.  DC Wellbutrin Restart Prozac 20 mg daily.  February 2021 appointment with the following noted: Completed grant proposal.  Couldn't doit without Adderall.  Sold a bunch of work.   Adderall XR lasts about 3 pm.  Strength seems about right.  BP been OK.  Not jittery.   Stopped Wellbutrin but had no SE. Mood drastically better with grant proposal and back on fluoxetine.  Less depressed and lethargic.  No anxiety.  Cut back on coffee. Started back with devotions and stronger faith. Plan: Continue Prozac 20 mg daily. May have to increase the dose at some point in the future given that she usually was taking higher dosages but she is getting good response at this time. Restart Wellbutrin off label for ADD since can't get 2 ADDERALL daily. 150 mg daily then 300 mg daily. She can adjust the dose between 150 mg and 300 mg daily to get the optimal effect.   05/11/2020 appointment with the following noted: Has been inconsistent with Prozac and Wellbutrin. Not sure of the effect of Wellbutrin. Biggest deterrent in work is  anxiety.  Some of the work is conceptual and difficult at times.  Can feel she's not up to a project at times.  Overall is OK but would like a steadier benefit from stimulant.  Exhausted from managing concentration and keeping up with things from the day.  Loses things.  Not good keeping up with schedule. Overall productive and emotionally OK. Can feel Adderall wear off. Mood is better in summer and worse in the winter.   F died in 2023-10-04 and that is a loss. No SE Wellbutrin. Still attends AA meetings.  Real benefit from [place] last year. Recognizes effect of anemia on ADD and mood.  Had iron infusions last winter. Plan:  Wellbutrin off label for ADD since can't get 2 ADDERALL daily. 150 mg daily then 300 mg daily.  01/24/2021 appointment with following noted: Doing a program called [employer] mindfulness app since Xmas.  CBT app helped the depression.  App helped her focus better.  Lost sign weight. Writing a lot. Before Xmas felt depressed and started negative thinking worse, self denigrating. Not drinking. More isolated.   Recognizes mo is narcissist.    Didn't tell anyone she was born until 3 mos later.  M aloof and uninterested in pt.  Lied about her birthday.  Mo lack of affection even with pt's kids. Going to [employer] for a year and it helped her to quit drinking. Also misses  kids being gone with a hole also. Plan: No med changes  05/04/2021 appointment with the following noted: Therapist Paula Pile thinks she's manic. Lost weight to 144#.   States she is still sleeping okay.  Admits she is hyper and recognizes that she is likely manic.  She feels great, euphoric with an increased sense of spiritual connectedness to God.  She has racing thoughts and talks fast and talks a lot and this is noted by her husband.  He thinks she is a bit hyper.  She has been able to maintain sobriety although she will have 1 glass of wine on special occasions but does not drink by herself.  She is not  drinking to excess.  She denies any dangerous impulsivity.  She is clearly not depressed and not particularly anxious.  She has no concerns about her medication and she has been compliant.  06/16/21 appt noted: So much better.  Going through a lot but the manic thing happened on top of it.  So much slower.  Didn't feel like losing anything with risperidone.  Likes the Adderalll at 10 mg. Some drowsiness in the AM and very drowsy from risperidone 2 mg HS. Prayer life is better. Handling stress better. Less depressed with risperidone. Still likes trazodone. Sleeps well. Plan: Reduce Prozac to 10 mg daily.  Consider stopping it because it can feel the mania however she is reluctant to do that because she fears relapse of depression. Reduce risperidone to 1.5 mg nightly due to side effects.  Discussed risk of worsening mania.  07/25/2021 appointment with the following noted: Misses the Adderall and hard to function without it. Depressed now. Heavy chest.  Anxious and guilty.  Body feels heavy.   Hates Wellbutrin.   Plan: Increase fluoxetine to 20 mg daily Add Abilify 1/2 of 15 mg tablet daily Wean wellbutrin by 1 tablet each week  bc she feels it is not helpful and DT polypharmacy Reduce risperidone to 1 daily for 1 week and stop it. Disc risk of mania. Increase Adderall to XR 20 mg AM  08/08/21 Much less depressed and starting to feel normal I feel a lot better. No SE.  Speech normal off risperidone. Sleeping OK on trazaodone and enough.   Noticed benefit from Adderall again. Plan: continue fluoxetine to 20 mg daily Continue Abilify 1/2 of 15 mg tablet daily for depression and mania continue Adderall to XR 20 mg AM  10/10/2021 phone call: Pt stated she feels like the Abilify should be decreased to 5mg.She said she is depressed but rational and not suicidal.She has an appt Monday and can wait until then if you prefer. MD response: Reduce the Abilify to 7.5 mg every other day.  We will meet on  10/16/2021 and decide what to do from there.  10/16/2021 appointment with the following noted: More depressed.  Most depressed I've ever been.  Just numb.  Sense of grief.   Thinks the manic episode was unlike anything else she ever had.  Doesn't want to medicate against it.  Don't enjoy people.  Easily overwhelmed.  Had some death thoughts but not suicidal.  Has been functional.  Feels better today after reducing Abilify to every other day but she is only been doing that for 3 days. A/P: Episode of post manic depression was explained. continue fluoxetine to 20 mg daily Hold Abilify for 1 week then resume Abilify 1/2 of 15 mg tablet every other day for depression and mania continue Adderall to XR 20 mg   AM  10/27/2021 appointment with the following noted: I'm doing so much better.  Handling the depression better. Better self talk and spiritual focus has helped.   Dep 6/10 manifesting as anxiety with low confidence.   F died 2  years ago and M 66 yo and is dependent . She is working hard to feel better but still feels depressed.  She almost feels like she has a little more anxiety since restarting Abilify every other day. Plan: continue fluoxetine to 20 mg daily DC Abilify .  Vrayalar 1.5 mg QOD to try to get rid of depression ASAP. continue Adderall to XR 20 mg AM  11/10/2021 appointment with the following noted: Busy with Xmas and it was fun with family but then a big let down.  Did well with it.  Functioned well with it.  Working hard on things with depression.  Not shutting down. Not sure but feels better today but yesterday was hard.  Difficulty dealing with mother.  She won't do anything to help herself.  Yesterday with her all day.  Won't do PT and has isolated herself.    Lack of confidence.   No SE with Vraylar.  11/24/21 urgent appointment appt noted: More and more depressed.   So anxious and doesn't want to be alone but can do so. No appetite. Hurts inside. Has had some fleeting  suicidal thoughts but would not act on them.  Tolerating meds. Has been consistent with Vraylar 1.5 mg every other day, fluoxetine 20 mg daily Plan: Increase Vraylar to 1.5 mg daily Change Prozac to Trintellix 10 mg daily. Discussed side effects of each continue Adderall to XR 20 mg AM  12/27/2021 appointment with the following noted: Not OK.  I feel less depressed but feels bat shit. Not sleeping well.  Extremely anxious. Off and on sleep. 3-4 hours of sleep.   Still having daily SI.  But also become obvious has so much to do.  Overwhelmed by tasks.   Needs anxiety meds to just function. Not more motivated.  Walked yesterday.   Feels afraid like in trouble but not irritable or angry. DC DT agitation Vraylar to 1.5 mg daily Change Prozac to Trintellix 10 mg daily. Hold Adderall to XR 20 mg AM Clonidine 0.1 1/2 tablet twice daily for 2 days and if needed for anxiety and sleep increase to 1 twice daily Ok temporary Ativan 1 mg 3 times daily as needed anxiety  01/05/22 appt noted: Off fluoxetine and  Trintellix.  Only on Ativan, trazodone and Adderall XR 20 plus added clonidine 0.1 mg BID Didn't think she needed to start Trintellix. Not taking Ativan.   Didn't like herself last week. Feels some better today. Wonders if the manic sx Not agitated.  Anxiety kind of calmed down.  A lot to be anxious about situationally.  $ stress. Concerns about downers with meds. Can't access normal personality. ? Lethargy and inability to talk as sE. Plan: Latuda 20-40 mg daily with food. Adderall to XR 20 mg AM Clonidine 0.1 1/2 tablet twice daily  reduce dose to be sure no SE Ok temporary Ativan 1 mg 3 times daily as needed anxiety  01/19/22 appt noted: Taking Latuda 20 mg daily.  Took 40 mg once and felt anxious and  SI Still depressed and not very reactive Anxiety mainly about the depression and fears of the future. She wants to revisit manic sx and thinks it was maybe bc taking delta 8 bc was  taking a lot of   it so still doesn't think she's classic bipolar. She wants to only take Prozac bc thinks Latuda is perpetuating depression. Says the delta 8 was very psychaedelic.  When not taking it was not manic.  Sleeping ok again.  Plan: Per her request DC Latuda 20-40 mg daily with food. She wants to continue Prozac alone AMA  Adderall to XR 20 mg AM Clonidine 0.1 1/2 tablet twice daily  reduce dose to be sure no SE Ok temporary Ativan 1 mg 3 times daily as needed anxiety  01/23/2022 phone call complaining of increased anxiety since stopping Latuda.  She will try increasing clonidine.  01/26/2022 phone call not feeling well and wanted to restart the Vraylar.  However notes indicate that had made her agitated therefore she was encouraged to pick up samples of Rexulti 1 mg and start that instead.  02/06/2022 phone call: Stating she felt the Rexulti was helping with depression but she was not sleeping well and obsessing over things.  She was encouraged to increase Rexulti to 2 mg daily and increase trazodone for sleep.  02/09/2022 appointment with the following noted: This was an urgent work in appointment No sleep last night with trazodone 100 mg HS Nothing really better depression or anxiety. Ruminating negative anxious thoughts. Did not tolerate Rexulti because it was causing insomnia.  Does not think it helped depression.  Lacks emotion that she should have.  Lacks her usual personality.  Some hopeless thoughts.  Some death thoughts.  Some suicidal thoughts without plan or intent Plan: DC Rexulti and Prozac & DC trazodone Adderall to XR 20 mg AM Clonidine 0.1 1/tablet twice daily  reduce dose to be sure no SE Ok temporary Ativan 1 mg 3 times daily as needed anxiety Start Seroquel XR 150 mg nightly  03/02/2022 appointment: Denise called back a few days after starting Seroquel stating it was making her more anxious and more depressed.  This seemed unlikely as this medicine rarely ever  causes anxiety.  She stopped the medication waited 3 days and called back still had anxiety and depression but thought perhaps the anxiety was a little better.  She did not want to take the Seroquel. She knew about the option of Spravato and wanted to pursue that. Now questions whether to return to Seroquel while waiting to start Spravato bc feels just as bad without it and knows she didn't give it enough time to work.   MADRS 46  ECT-MADRS    Flowsheet Row Clinical Support from 08/06/2022 in [facility] Clinical Support from 07/04/2022 in [facility] Clinical Support from 05/21/2022 in [facility] Office Visit from 03/02/2022 in [facility]  MADRS Total Score 29 15 27 46      03/14/22 appt noted: Pt received Spravato 56 mg first dose today with some dissociative sx which were not severe.  She was anxious prior to the administration and felt better after receiving lorazepam 1 mg.  No NV, or HA. Wants to continue Spravato. Ongoing depression and desperate to feel better.  I'm not myself DT deprsssion which is most severe in recent history.  Anhedonia.  Low motivation.  Social avoidance. Continues to think all recent med trials are making her worse.  Sleep ok with Seroquel.  03/16/22 appt noted: Received Spravato 84 mg for the first time.  some dissociative sx which were not severe.  She was anxious prior to the administration and felt better after receiving lorazepam 1 mg.  No NV, or HA. Wants   to continue Spravato.   Does not feel any better or different since the last appt.  Ongoing depression.  Ongoing depression and desperate to feel better.  I'm not myself DT deprsssion which is most severe in recent history.  Anhedonia.  Low motivation.  Social avoidance. Continues to think all recent med trials are making her worse.  Sleep ok with Seroquel.  Does not want to continue Seroquel for TRD.  03/20/2022 appointment noted: Came for  Spravato administration today.  However blood pressure was significantly elevated approximately 180/115.  She was given lorazepam 1 mg and clonidine 0.2 mg to try to get it down. She states she regretted stopping the Seroquel XR 300 mg tablets.  She now realizes it was helpful.  She did not sleep much at all last night.  She did not take the Adderall this morning. 2 to 3 hours after arrival blood pressure was still elevated at  170/110, 62 pulse.  For Spravato administration was canceled for today.  She admits to being anxious and depressed.  She is not suicidal.  She is highly motivated to receive the Spravato.  We discussed getting it tomorrow.  03/22/2022 appointment noted: Patient's blood pressure was never stable enough yesterday in order to get her in for Spravato administration.  She was encouraged to see her primary care doctor.  It is better today.  03/26/2022 appointment with the following noted: Blood pressure was better.  Saw her primary care doctor who started on oral Bystolic 2.5 mg daily. Received Spravato 84 mg today as scheduled.  Tolerated it well without nausea or vomiting headache or chest pain or palpitations.  Her blood pressure was borderline but manageable. She remains depressed and anxious.  She is ambivalent about the medicine and desperate to get to feel better.  Continues to have anhedonia and low energy and low motivation and reduced ability to do things.  Less social.  Not suicidal.  03/28/22 appt noted: Received Spravato 84 mg today as scheduled.  Tolerated it well without nausea or vomiting headache or chest pain or palpitations.  Her blood pressure was borderline but manageable. Has not seen any improvement so far.  Tolerating Seroquel.  Inconsistent with Bystolic and BP has been borderline high. Still depressed and anxious and anhedonia.  Low motivation, energy, productivity. Taking quetiapine and tolerating XR 300 mg nightly.  04/04/22 appt noted: Received Spravato  84 mg today as scheduled.  Tolerated it well without nausea or vomiting headache or chest pain or palpitations.  Her blood pressure was borderline but manageable. Has not seen any improvement so far.  Tolerating Seroquel.   She still tends to think that the medications are making her worse.  She has said this about each of the recent psychiatric medicines including Seroquel.  However her husband thinks she is improved.  She also admits there is some improvement in productivity.  She still feels highly anxious.  She still does not enjoy things as normal.  She still feels desperate to improve as soon as possible. Has been taking Seroquel XR since 03/20/2022  04/10/22 appt noted: Received Spravato 84 mg today as scheduled.  Tolerated it well without nausea or vomiting headache or chest pain or palpitations.  Her blood pressure was borderline but manageable. Has not seen any improvement so far.  Tolerating Seroquel.  Doesn't like Seroquel bc she thinks it flattens here. Ongoing depression without confidence Plan: Start Auvelity 1 every morning for persistent treatment resistant depression  04/12/2022 appointment with the following noted:  Received Spravato 84 mg today as scheduled.  Tolerated it well without nausea or vomiting headache or chest pain or palpitations.  Her blood pressure was borderline but manageable. Has not seen any improvement so far.  Tolerating Seroquel.  Doesn't like Seroquel bc she thinks it flattens her. Received Spravato 84 mg today as scheduled.  Tolerated it well without nausea or vomiting headache or chest pain or palpitations.  Her blood pressure was borderline but manageable. Has not seen any improvement so far.  Tolerating Seroquel.  Doesn't like Seroquel bc she thinks it flattens here.  We discussed her ambivalence about it. She is starting Auvelity and has tolerated it the last 2 days without side effect.  She still does not feel like herself and feels flat and not enjoying  things with suppressed expressed emotion  04/17/2022 appointment with the following noted: Received Spravato 84 mg today as scheduled.  Tolerated it well without nausea or vomiting headache or chest pain or palpitations.  Her blood pressure was borderline but manageable. Has not seen any improvement so far.  Tolerating Seroquel.  Doesn't like Seroquel bc she thinks it flattens her. She has been tolerating the Auvelity 1 in the morning without side effects for about a week.  She has not noticed significant improvement so far.  She still feels depressed and flat and not herself.  Other people notice that she is flat emotionally.  She is not suicidal.  She does feel discouraged that she is not getting better yet.  04/19/2022 appointment noted: Has increased Auvelity to 1 twice daily for 2 days, continues quetiapine XR 300 mg nightly, clonidine 0.3 mg twice daily, lorazepam 1 mg twice daily for anxiety and Adderall XR 20 mg in the morning. No obious SE but she still thinks quetiapine XR is making her feel down.  But not sedated Received Spravato 84 mg today as scheduled.  Tolerated it well without nausea or vomiting headache or chest pain or palpitations.  Her blood pressure was borderline but manageable. She still feels quite anxious and feels it necessary to take both the clonidine and lorazepam twice a day to manage her anxiety.  She has been consistently down and flat and not herself until yesterday afternoon she noted an improvement in mood and feeling much more like herself with her normal personality reemerging.  She was quite depressed in the morning with very dark negative thoughts.  She did not have those dark negative thoughts this morning.  She had a lot of questions about medication and when she was expecting to be improved and why she has not shown improvement up to now.  04/23/22 appt noted: Has increased Auvelity to 1 twice daily for 1 week, continues quetiapine XR 300 mg nightly, clonidine 0.3  mg twice daily, lorazepam 1 mg twice daily for anxiety and Adderall XR 20 mg in the morning. No obious SE but she still thinks quetiapine XR is making her feel down.  But not sedated Received Spravato 84 mg today as scheduled.  Tolerated it well without nausea or vomiting headache or chest pain or palpitations.  She is still depressed but admits better function and is able to enjoy social interactions. Tolerating meds.  Would like to feel better for sure. Not herself.  Flat. Plan increase Auvelity to 1 tab BID as planned and reduce Quetiapine to 1/2 of ER 300 mg  bc NR for depression.  04/25/2022 appointment with the following noted: clonidine 0.3 mg twice daily, lorazepam 1 mg twice daily   for anxiety and Adderall XR 20 mg in the morning. Seroquel XR 300 HS No obious SE but she still thinks quetiapine XR is making her feel down.  But not sedated Received Spravato 84 mg today as scheduled.  Tolerated it well without nausea or vomiting headache or chest pain or palpitations.  Called yesterday with more anxiety.  Had increased Auvelity for 1 day and reduced Seroquel XR for 1 day.  Felt restless and fearful  05/01/2022 appointment noted: clonidine 0.3 mg twice daily, lorazepam 1 mg twice daily for anxiety and Adderall XR 20 mg in the morning. Seroquel XR 150 HS, Auvelity 1 BID Received Spravato 84 mg today as scheduled.  Tolerated it well without nausea or vomiting headache or chest pain or palpitations.  Nurse has noted patient has called multiple times sometimes asking the same question repeatedly.  It is unclear whether she is truly forgetful or is just anxious seeking reassurance. Patient acknowledges ongoing depression as well as some anxiety but states she has felt a little better in the last couple of days.  She has reduced the Seroquel to 150 mg at night and has increased Auvelity to 1 twice daily but only for 1 day.  So far she seems to be tolerating it.  05/03/22 appt noted: clonidine 0.2 mg  twice daily, lorazepam 1 mg twice daily for anxiety and Adderall XR 20 mg in the morning. Seroquel XR 150 HS, Auvelity 1 BID BP high this am about 170/100 and received extra clonidine 0.2 mg and came to receive Spravato.  Not dizzy, no SOB, nor CP but BP is still high Could not receive Spravato today bc BP high and pulse low at 30 ppm. Still depressed and anxious. Plan: continue trial Auvelity with Spravato She needs to get BP and pulse managed  05/08/22 TC: RTC  H Michael NA and mailbox full.  Could not leave message.  Pt  -  talked to she and H on speaker. H worried over wife.  Vacant stare.  Slurs words at times.  Not smiling. Reduced enjoyment.  Depression.  Withdrawn from usual activities.  Some irritability.  Anxious. Disc her concerns meds are making her worse.  Extensive discussion about her treatment resistant status.  There is a consistent pattern of not taking the medicines long enough to get benefit because she believes the meds are making her worse.  However the symptoms she describes as side effects are exactly the same symptoms that she had prior to taking the medication RX for  the depression.  So it is not clear that these are actual side effects. This is true about the 2 most recent meds including Seroquel and Auvelity.  Recommend psychiatric consultation in hopes of improving her comfort level with taking prescribed medications for a sufficient length of time to provide benefit. Extensive discussion about ECT is the treatment of choice for treatment resistant depression.  Spravato may work if she can comply with consistency.  There are medication options but they take longer to work.   Plan:  Reduce clonidine to 0.1 mg BID DT bradycardia.  Talk with PCP about BP and low pulse problems which are interfering with her consistent compliance with Spravato.   Limit lorazepam to 3 -4  mg daily max. Excess use is the cause of slurring speech.  She must stop excess use or will have to stop  the med. Stop Auvelity per her request.  But she has only been on the full dose for a little over  a week and clearly has not had time to get benefit from it.  She thinks maybe it is making her more anxious. Reduce Seroquel from 150XR to 50 -100 mg at night IR.  She couldn't sleep when stopped it completely. Will not start new antidepressant until her SE issues are resolved or not. Get second psych opinion from Chris Aiken MD or another psychiatrist.  H's sister is therapist in [place] Zaydyn Havey Cottle, MD, DFAPA  05/16/2022 appointment with the following noted: Received Spravato 84 mg today as scheduled.  Tolerated it well without nausea or vomiting headache or chest pain or palpitations.  She stopped Auvelity as discussed last week. On her own, without physician input, she restarted Wellbutrin XL 450 mg every morning today.  She had taken it in the past.  She feels jittery and anxious. She feels less depressed than she did last week.  But she is still depressed without her usual range of affect.  She still is less social and less motivated than normal. Her primary care doctor increased the dose of losartan Plan: Stop Seroquel Reduce Wellbutrin XL to 300 mg every morning.  Starting the dose at 450 every morning is likely causing side effects of jitteriness and it should not be started at that have a dosage. Recommend she not change meds on her own without MDM put  05/23/2022 appointment with the following noted: Received Spravato 84 mg today as scheduled.  Tolerated it well without nausea or vomiting headache or chest pain or palpitations.  Has not dropped seroquel XR 300 mg 1/2 tablet nightly bc couldn't sleep without it. Has not tried lower dose quetiapine 50 mg HS Still feels depressed.   BP is better managed so far, just saw PCP.  BP is better today and infact is low today. Dropped clonidine as directed from 0.3 mg BID bc inadequate control of BP to 0.2 mg BID.  However she wants to increase it  back to 0.3 mg twice daily because she feels it helped her anxiety better.  Wonders about increasing Wellbutrin for depression.  However she has only been on 300 mg a day for a week.  She was on 450 mg daily in the past.  06/06/22 appt noted: Received Spravato 84 mg today as scheduled.  Tolerated it well without nausea or vomiting headache or chest pain or palpitations.  She is still depressed and anxious.  She wants to try to stop the Seroquel but cannot sleep without some of it.  She is taking lorazepam 1 mg 4 times daily and still having a lot of anxiety.  She wants to increase clonidine back to 0.3 mg twice daily.  She hopes for more improvement She recently went for a second psychiatric opinion as suggested the results of that are pending.  06/11/22 appt noted: Received Spravato 84 mg today as scheduled.  Tolerated it well without nausea or vomiting headache or chest pain or palpitations.  She is still depressed and anxious. Without much change.  Still hopeless, anhedonia, reduced inteterest and motivation.  Tolerating meds. Disc concerns Spravato is not hleping much. Plan: stop Seroquel and start olanzapine 10 mg HS for TRD and anxiety.  06/13/2022 appointment noted: Received Spravato 84 mg today as scheduled.  Tolerated it well without nausea or vomiting headache or chest pain or palpitations.  She is still depressed and anxious. Without much change.  Still hopeless, anhedonia, reduced inteterest and motivation.  Tolerating meds. Disc concerns Spravato is not helping much as hoped but is   improving a bit in the last week. Tolerating meds. Continues Wellbutrin XL 450 AM, tolerating recently started olanzapine  10 mg HS. Sleep is good.   Pending appt with TMS consult.  06/18/22 appt noted: Received Spravato 84 mg today as scheduled.  Tolerated it well without nausea or vomiting headache or chest pain or palpitations.  Tolerating meds. Continues Wellbutrin XL 450 AM, tolerating recently started  olanzapine  10 mg HS. Continues Adderall XR 20 amd and has tried to reduce lorazepam to 1mg TID Sleep is good.   Pending appt with TMS consult. Depression is a little bit better in the last week with a little improvement in emotional expression and interest.  She is pushing herself to be more active.  Her daughter thought she was a little better than she has been.  However she is still depressed and still not her normal self with anhedonia and reduced emotional expressiveness.  06/20/22 appt noted: Received Spravato 84 mg today as scheduled.  Tolerated it well without nausea or vomiting headache or chest pain or palpitations.  Tolerating meds with a little sleepiness. Continues Wellbutrin XL 450 AM, tolerating recently started olanzapine  10 mg HS. Continues Adderall XR 20 amd and has tried to reduce lorazepam to 1mg TID Sleep is good.   Mood is improving.  Better funciton.  Anxiety is better with olanzapine. Still not herself and depression not gone with some anhedonia and social avoidance and feeling overwhelmed.  8/14 2023 received Spravato 84 mg 06/27/2022 received Spravato Spravato 84 mg 07/02/2022 received Spravato 84 mg 07/04/2022 received Spravato 84 mg  07/09/2022 appointment noted: Received Spravato 84 mg today as scheduled.  Tolerated it well without nausea or vomiting headache or chest pain or palpitations.  Expected dissociation and feels less depressed with resolution of negative emotions immediately after Spravato and then depression, anxiety creep back in. Continues meds Adderall XR 20 mg every morning, Wellbutrin XL 450 every morning, clonidine 0.1 mg twice daily, lorazepam 1 mg every 6 hours as needed, olanzapine increased from 7.5 to 10 mg nightly on  Tolerating meds.  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  She does note some increased appetite and is  somewhat concerned about that but has not gained significant amounts of weight. She has had the TMS consultation which was initially denied but she knows it can be appealed.  However because she is improving with Spravato plus the other medications now she wants to continue the current treatment plan.  07/18/22 appt noted: Continues meds Adderall XR 20 mg every morning, Wellbutrin XL 450 every morning, clonidine 0.1 mg twice daily, lorazepam 1 mg every 6 hours as needed, olanzapine increased from 7.5 to 10 mg nightly on 07/04/2022. Received Spravato 84 mg today as scheduled.  Tolerated it well without nausea or vomiting headache or chest pain or palpitations.  Expected dissociation and feels less depressed with resolution of negative emotions immediately after Spravato and then depression, anxiety creep back in. Continues meds Adderall XR 20 mg every morning, Wellbutrin XL 450 every morning, clonidine 0.1 mg twice daily, lorazepam 1 mg every 6 hours as needed, olanzapine increased from 7.5 to 10 mg nightly on  Tolerating meds.  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  She does note some increased appetite and   is somewhat concerned about that but has not gained significant amounts of weight. She has had the [place] consultation which was initially denied but she knows it can be appealed. She continues to have chronic ambivalence about psychiatric medicines and initially tends to blame her depressive symptoms such as decreased concentration and feeling flat on what ever medicine she currently is taking even though she had the same symptoms before the current medicines were started.  Then after discussion she does admit that her depressive symptoms are improved since adding olanzapine but still has those residual symptoms noted.  07/23/22 received Spravato 84 mg   07/30/2022 appointment  noted: Received Spravato 84 mg today as scheduled.  Tolerated it well without nausea or vomiting headache or chest pain or palpitations.  Expected dissociation and feels less depressed with resolution of negative emotions immediately after Spravato and then depression, anxiety creep back in. Continues meds Adderall XR 20 mg every morning, Wellbutrin XL 450 every morning, clonidine 0.1 mg twice daily, lorazepam 1 mg every 6 hours as needed, olanzapine increased from 7.5 to 10 mg nightly on  She has been inconsistent with olanzapine because she continues to be ambivalent about the medications in general and thinks that perhaps the 10 mg is making her feel blunted.  She continues to feel some depression.  She had a good day this week and but still feels somewhat depressed and persistently anxious. Plan: be consistent with olanzapine 10 mg HS for TRD and longer trial for potential benefit for anxiety.  Has not taken it consistently.  08/06/2022 appointment noted: Received Spravato 84 mg today as scheduled.  Tolerated it well without nausea or vomiting headache or chest pain or palpitations.  Expected dissociation and feels less depressed with resolution of negative emotions immediately after Spravato and then depression, anxiety creep back in. Continues meds Adderall XR 20 mg every morning, Wellbutrin XL 450 every morning, clonidine 0.1 mg twice daily, lorazepam 1 mg every 6 hours as needed, olanzapine i 10 mg nightly  She continues to feel depressed but is about 50% better with Spravato.  She is still not herself.  She still has anhedonia.  She still is not her able to engage socially in the typical ways.  She is not jovial and outgoing like normal.  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  She continues to feel that her personality is dampened down.  There is a question about whether it is  related to depression or medication. Plan: continue olanzapine 10 for longer trial for TRD and severe anxiety.  08/13/22 appt noted:  Received Spravato 84 mg today as scheduled.  Tolerated it well without nausea or vomiting headache or chest pain or palpitations.  Expected dissociation and feels less depressed with resolution of negative emotions immediately after Spravato and then depression, anxiety creep back in. Continues meds Adderall XR 20 mg every morning, Wellbutrin XL 450 every morning, clonidine 0.1 mg twice daily, lorazepam 1 mg every 6 hours as needed, olanzapine i 10 mg nightly  She still does not feel herself.  Still struggles with depression and low motivation and reduced social engagement and reduced interest and reduced emotional expression.  She is somewhat better with the medicines plus Spravato.  She still believes the Spravato makes her blunted and is not sure how much it helps her anxiety.  She can have good days when  her family is around and she is engaged.  She still wants to stop the olanzapine. She has apparently continued to take the trazodone despite having been told to stop it when she started olanzapine.  She feels like she needs the trazodone. Plan: DC olanzapine and Start nortriptyline 25 mg nightly and build up to 75 mg nightly and then check blood level.    08/27/2022 appointment noted: Received Spravato 84 mg today as scheduled.  Tolerated it well without nausea or vomiting headache or chest pain or palpitations.  Expected dissociation and feels less depressed with resolution of negative emotions immediately after Spravato and then depression, anxiety creep back in. Continues meds Adderall XR 20 mg every morning, Wellbutrin XL 450 every morning, clonidine 0.1 mg twice daily, lorazepam 1 mg every 6 hours as needed. Stopped olanzapine and started nortriptyline which she has taken for about a week is 75 mg nightly. So far she is tolerating the nortriptyline well with the  exception of some dry mouth and constipation which she is working to manage.  She does not feel substantially better better or different off the olanzapine.  No change in her sleep which is good.  Main concern currently in addition to the residual depression is anxiety which is somewhat situational with pending arch show.  She is worrying about it more than normal.  Says she is having to take lorazepam twice a day where she had been able to keep reduce it prior to this.  She still does not feel like herself with residual depression with less social interest and less of her usual buoyancy in personality.  She is flatter than normal.  Overall she still feels that the Spravato has been helpful at reducing the severity of the depression.  She is not suicidal. She has not heard anything about the TMS appeal as of yet.  09/05/2022 appointment noted: Received Spravato 84 mg today as scheduled.  Tolerated it well without nausea or vomiting headache or chest pain or palpitations.  Expected dissociation and feels less depressed with resolution of negative emotions immediately after Spravato and then depression, anxiety creep back in. Continues meds Adderall XR 20 mg every morning, Wellbutrin XL 450 every morning, clonidine 0.1 mg twice daily, lorazepam 1 mg every 6 hours as needed. Stopped olanzapine and started nortriptyline which she has taken for about 2 week is 75 mg nightly. Initially blood pressure was a little high causing delay in starting Spravato.  She admitted to feeling a little wound up.  She still experiences a little increase in depression if she goes longer than a week in between doses of Spravato.  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  09/10/22 appt noted: Received Spravato 84 mg today as scheduled.  Tolerated it well without nausea or vomiting headache or chest pain or palpitations.   Expected dissociation gradually resolved over the 2 hour observation period. She feels 50% less depressed with Spravto and wants to continue it.   Continues meds Adderall XR 20 mg every morning, Wellbutrin XL 450 every morning, clonidine 0.1 mg twice daily, lorazepam 1 mg every 6 hours as needed. Has started nortriptyline 75 mg nightly for about 3 weeks. Has not seen a significant difference with the addition of nortriptyline.  Tolerating it pretty well. She continues to have some degree of anhedonia and significant depression and anxiety.  Her daughters noticed that   she is more needy and calls more frequently.  She acknowledges this as well.  She is clearly still not herself.  ECT-MADRS    Flowsheet Row Clinical Support from 08/06/2022 in [place] from 07/04/2022 in [place] from 05/21/2022 in [place] Visit from 03/02/2022 in [facility]  MADRS Total Score _0 46        Past Psychiatric Medication Trials: fluoxetine, duloxetine, Viibryd, lamotrigine, Pristiq, sertraline, citalopram,  Trintellix anxious and SI Wellbutrin XL 450 [place] 1 dose  Adderall, Adderall XR, Vyvanse, Ritalin, Strattera low dose NR Lorazepam Trazodone  Depakote,  lamotrigine cog complaints Lithium remotely Abilify 7.5  Vraylar 1.5 mg daily agitation and insomnia Rexulti insomnia Latuda 40 one dose, CO anxious and SI Seroquel XR 300 Olanzapine 10  At visit November 12, 2019. We discussed Patient developed an increasingly severe alcohol dependence problem since her last visit in January.  She went to [facility] and has had no alcohol since then except 1 day.  She never abused stimulants but they took her off the stimulants at [facility].  Her ADD was markedly worse.  The Wellbutrin did not help the ADD.   D history lamotrigine rash at 66 yo  Review of Systems:  Review of  Systems  Constitutional:  Positive for fatigue.  Cardiovascular:  Negative for palpitations.  Musculoskeletal:  Positive for arthralgias and back pain. Negative for joint swelling.       SP hip surgery October 2020  Neurological:  Negative for dizziness and tremors.  Psychiatric/Behavioral:  Positive for decreased concentration and dysphoric mood. Negative for agitation, behavioral problems, confusion, hallucinations, self-injury, sleep disturbance and suicidal ideas. The patient is nervous/anxious. The patient is not hyperactive.     Medications: I have reviewed the patient's current medications.  Current Outpatient Medications  Medication Sig Dispense Refill   amLODipine (NORVASC) 2.5 MG tablet Take 2.5 mg by mouth daily.     amphetamine-dextroamphetamine (ADDERALL XR) 20 MG 24 hr capsule Take 1 capsule (20 mg total) by mouth every morning. 30 capsule 0   amphetamine-dextroamphetamine (ADDERALL XR) 20 MG 24 hr capsule Take 1 capsule (20 mg total) by mouth every morning. 30 capsule 0   buPROPion (WELLBUTRIN XL) 150 MG 24 hr tablet TAKE 3 TABLETS BY MOUTH DAILY 270 tablet 1   cloNIDine (CATAPRES) 0.2 MG tablet Take 1 tablet (0.2 mg total) by mouth 2 (two) times daily. 60 tablet 1   iron polysaccharides (NIFEREX) 150 MG capsule TAKE 1 CAPSULE BY MOUTH EVERY DAY 90 capsule 1   LORazepam (ATIVAN) 1 MG tablet Take 1 tablet (1 mg total) by mouth every 8 (eight) hours as needed. for anxiety 90 tablet 0   losartan (COZAAR) 50 MG tablet Take 50 mg by mouth daily.     nebivolol (BYSTOLIC) 2.5 MG tablet Take 2.5 mg by mouth daily.     nortriptyline (PAMELOR) 25 MG capsule Take 3 capsules (75 mg total) by mouth at bedtime. 90 capsule 0   pramipexole (MIRAPEX) 0.25 MG tablet Take 1 tablet (0.25 mg total) by mouth 2 (two) times daily. 60 tablet 0   SPRAVATO, 84 MG DOSE, 28 MG/DEVICE SOPK USE 3 SPRAYS IN EACH NOSTRIL TWICE A WEEK 3 each 3   traZODone (DESYREL) 50 MG tablet 1-2 tablets nightly as needed  for sleep 60 tablet 1   No current facility-administered medications for this visit.    Medication Side Effects: None  Allergies:  Allergies  Allergen Reactions   Metronidazole Shortness Of Breath and Other (See Comments)    Heart pounding   Ferrlecit [Na Ferric Gluc Cplx In Sucrose] Other (See Comments)    Infusion reaction 05/12/2019    Past Medical History:  Diagnosis Date   ADHD    Anemia    Anxiety    Arthritis    Depression    Heart murmur    i went to see a cardiologit slast eyar  and i had zero plaque,    PONV (postoperative nausea and vomiting)    Recovering alcoholic in remission ([place])     Family History  Problem Relation Age of Onset   Atrial fibrillation Mother    CAD Father     Past Medical History, Surgical history, Social history, and Family history were reviewed and updated as appropriate.   Please see review of systems for further details on the patient's review from today.   Objective:   Physical Exam:  There were no vitals taken for this visit.  Physical Exam Constitutional:      General: She is not in acute distress. Neurological:     Mental Status: She is alert and oriented to person, place, and time.     Coordination: Coordination normal.     Gait: Gait normal.  Psychiatric:        Attention and Perception: Attention and perception normal.        Mood and Affect: Mood is anxious and depressed. Affect is not labile, blunt, angry or tearful.        Speech: Speech is not rapid and pressured or slurred.        Behavior: Behavior is not slowed.        Thought Content: Thought content is not paranoid or delusional. Thought content does not include homicidal or suicidal ideation. Thought content does not include suicidal plan.        Cognition and Memory: Cognition normal. Memory is not impaired. She does not exhibit impaired recent memory.        Judgment: Judgment normal.     Comments: Insight intact. No auditory or visual hallucinations. No  delusions.  Depression ongoing residual. Anxiety is situationally worse. Affect still depressed but less blunted than it was before Spravato.  Still blunted No [place]ui intent plan      Lab Review:     Component Value Date/Time   NA 137 01/12/2021 1430   NA 140 11/18/2018 1544   K 3.8 01/12/2021 1430   CL 108 01/12/2021 1430   CO2 22 01/12/2021 1430   GLUCOSE 94 01/12/2021 1430   BUN 14 01/12/2021 1430   BUN 20 11/18/2018 1544   CREATININE 0.82 01/12/2021 1430   CALCIUM 8.9 01/12/2021 1430   PROT 6.6 01/12/2021 1430   ALBUMIN 3.9 01/12/2021 1430   AST 12 (L) 01/12/2021 1430   ALT 11 01/12/2021 1430   ALKPHOS 46 01/12/2021 1430   BILITOT 0.5 01/12/2021 1430   GFRNONAA >60 01/12/2021 1430   GFRAA >60 09/02/2019 0249   GFRAA >60 01/27/2019 0811       Component Value Date/Time   WBC 4.5 01/12/2021 1430   RBC 4.32 01/12/2021 1430   HGB 12.8 01/12/2021 1430   HGB 12.9 07/17/2019 0953   HCT 38.5 01/12/2021 1430   HCT 21.9 (L) 12/25/2018 1221   PLT 272 01/12/2021 1430   PLT 286 07/17/2019 0953   MCV 89.1 01/12/2021 1430   MCH 29.6 01/12/2021 1430   MCHC 33.2  01/12/2021 1430   RDW 12.4 01/12/2021 1430   LYMPHSABS 1.4 01/12/2021 1430   MONOABS 0.4 01/12/2021 1430   EOSABS 0.0 01/12/2021 1430   BASOSABS 0.0 01/12/2021 1430    No results found for: "POCLITH", "LITHIUM"   No results found for: "PHENYTOIN", "PHENOBARB", "VALPROATE", "CBMZ"   .res Assessment: Plan:    Recurrent major depression resistant to treatment ([place]) - Plan: pramipexole (MIRAPEX) 0.25 MG tablet  Generalized anxiety disorder - Plan: LORazepam (ATIVAN) 1 MG tablet  Insomnia due to mental condition - Plan: LORazepam (ATIVAN) 1 MG tablet  Attention deficit hyperactivity disorder (ADHD), predominantly inattentive type  Accelerated hypertension   She has treatment resistant major depression at this time.  Have  discussed some of her recent abnormal behaviors leading to this depressive episode  getting worse which she says were associated with heavy use of delta 8 and not a manic episode.  She realizes now that that was not good for her.  She stopped all use of other drugs including those available over-the-counter such as delta 8 or any other THC related products.  She is no longer having any of those types of behaviors and instead is depressed.  She remains depressed with lack of interest and lack of feeling for things that normally she would have feelings about.  She has low motivation and energy.  She is difficulty making decisions.  She is sad and down.  She is less productive than usual.  Her concentration is poor.  She has high degree of anxiety as well.  Her personality is more flat than usual. She has a very negative self-image and low self-esteem.  These are all uncharacteristic for her However all of these symptoms are partially improved through Spravato and the switch from Seroquel to olanzapine.  She is approximately 50% better.  Patient was administered Spravato 84 mg intranasally today.  The patient experienced the typical dissociation which gradually resolved over the 2-hour period of observation.  There were no complications.  Specifically the patient did not have nausea or vomiting or headache.  Blood pressures remained within normal ranges at the 40-minute and 2-hour follow-up intervals.  By the time the 2-hour observation period was met the patient was alert and oriented and able to exit without assistance.  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  See nursing note for further details.She wants to continue Spravato. We discussed discussed the side effects in detail as well as the protocol required to receive Spravato.   Failed multiple antidepressants.  Many of them were not actual failures but intolerances and it is unclear whether some of that was more connected with anxiety than true  side effects.  1 example is the [place].  In general she does not want to try anything but an antidepressant but has failed all major categories of antidepressants except TCAs and MAO inhibitors which have not been tried.  Objectively nor subjectively any different since stopping olanzapine.  In its place the next logical antidepressant would be to use a tricyclic antidepressant as she has not taken 1 of these medications before. Started nortriptyline 75 mg nightly.  Serum level 176.    Discussed side effects in detail.  No increase is likely to hlep. Extensive discussion previously about her ambivalence about meds and missatributing sx of depression as SE of meds.    continue Wellbutrin XL 450 mg AM  Started Spravato 84 mg twice weekly on 03/16/2022.  Now on weekly administration  Adderall  XR 20 mg AM  Ok temporary Ativan 1 mg 3-4 times daily as needed anxiety but try to cut it back. Is not ideal to use benzodiazepine with stimulant but because of the severity of her symptoms it has been necessary.  Hope to eventually eliminate the benzodiazepine.  Expected as her depression improves her anxiety will improve as well.  However lately her anxiety has been unmanageable.  We will expect that to improve as the depression improves.  She has headed insturctions to reduce this.  Continue clonidine 0.2 mg BID off label for anxiety and helps BP partially. BP is better controlled but not consistent.  Consider increasing amlodipine.  Also on losartan 50  Discussed potential benefits, risks, and side effects of stimulants with patient to include increased heart rate, palpitations, insomnia, increased anxiety, increased irritability, or decreased appetite.  Instructed patient to contact office if experiencing any significant tolerability issues. She wants to return to usual dose of Adderall for ADD bc of mor poor cognitive function with reduction.  Also discussed that depression will impair cognitive  function.  [place] consultation was initially denied but she understands is being appealed if needed.  She has not called about this and is encouraged to make this call to help determine the next steps.  Extensive discussion about her chronic ambivalence about psychiatric medicines and tendency to blame medicine for the symptoms of depression that she had prior to even starting the medications.  After discussing this at length she was able to acknowledge that that is factual.  She does not appear to be having any significant side effects with the current medications.  However so far not much benefit with nortriptyline despite adequate level.  Reviewed options for TRD and easiest to pursue with low risk is pramipexole off label and RX 0.25 mg BID  Has Maintained sobriety  FU with Spravato weekly   Lynder Parents, MD, DFAPA     Please see After Visit Summary for patient specific instructions.  No future appointments.            No orders of the defined types were placed in this encounter.      -------------------------------

## 2022-09-16 NOTE — Progress Notes (Signed)
NURSES NOTE:   Patient arrived for her 39th Spravato treatment. Pt is being treated for Treatment Resistant Depression, pt will be receiving 84 mg which will continue to be her maintenance dose, she receives weekly treatments.  Pt is also planning on [place] as soon as insurance will approve her treatments, then she will stop Spravato at that time. Patient arrived and taken to treatment room. Confirmed she had a ride home which is her husband would be coming back to pick up pt when done and sometimes she needs to use [facility] if he is unable to pick her up. Pt's Spravato is ordered through [employer] and delivered to office, all Spravato medication is stored at doctors office per REMS/FDA guidelines. The medication is required to be locked behind two doors per FDA/REMS Protocol. Medication is also disposed of properly per regulations.      Began taking patient's vital signs at 11:30 AM 133/95, pulse 74. Gave patient first dose 28 mg nasal spray, each nasal spray administered in each nostril as directed and waited 5 minutes between the second and third dose. All 3 doses given pt did not complain of any nausea/vomiting, given a cup of water due to the taste after the administration of Spravato.  She listens to [employer] with spa or relaxing music.  Checked 40 minute vitals at 12:15 PM, 117/76, pulse was 72. Explained she would be monitored for a total time of 120 minutes. Discharge vitals were taken at 1:30 PM 119/80 P 70. Dr. Clovis Pu met with pt today and discussed her medication and her plans for [place]. I walked pt to elevator, where her husband met her for the ride home. Recommend she go home and sleep or just relax on the couch. No driving, no intense activities. Verbalized understanding. Nurse was with pt a total of 70 minutes for clinical. Pt is coming back next week, Monday. Pt instructed to call office with any problems or questions.      LOT [id number] EXP AUG 2025

## 2022-09-17 ENCOUNTER — Ambulatory Visit (INDEPENDENT_AMBULATORY_CARE_PROVIDER_SITE_OTHER): Payer: 59 | Admitting: Psychiatry

## 2022-09-17 ENCOUNTER — Encounter: Payer: Self-pay | Admitting: Psychiatry

## 2022-09-17 ENCOUNTER — Ambulatory Visit: Payer: 59

## 2022-09-17 VITALS — BP 124/92 | HR 76

## 2022-09-17 DIAGNOSIS — F900 Attention-deficit hyperactivity disorder, predominantly inattentive type: Secondary | ICD-10-CM | POA: Diagnosis not present

## 2022-09-17 DIAGNOSIS — F339 Major depressive disorder, recurrent, unspecified: Secondary | ICD-10-CM

## 2022-09-17 DIAGNOSIS — F5105 Insomnia due to other mental disorder: Secondary | ICD-10-CM | POA: Diagnosis not present

## 2022-09-17 DIAGNOSIS — I10 Essential (primary) hypertension: Secondary | ICD-10-CM

## 2022-09-17 DIAGNOSIS — F411 Generalized anxiety disorder: Secondary | ICD-10-CM | POA: Diagnosis not present

## 2022-09-17 MED ORDER — PRAMIPEXOLE DIHYDROCHLORIDE 0.25 MG PO TABS: 0.3750 mg | ORAL_TABLET | Freq: Two times a day (BID) | ORAL | 0 refills | Status: DC

## 2022-09-17 NOTE — Progress Notes (Signed)
Laura Chang [id number] 01/19/1956 66 y.o.  Subjective:   Patient ID:  Laura Chang is a 66 y.o. (DOB 1956/04/08) female.  Chief Complaint:  Chief Complaint  Patient presents with   Follow-up   Depression   Fatigue   ADD   Anxiety     HPI Laura Chang presents to the office today for follow-up of depression and anxiety and ADD.  seen November 12, 2019.  Melted down in 2020.  Went to [employer] in July.  No withdrawal.  1 drink since then.  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 She was on no stimulant and no SSRI but was taking Strattera and Wellbutrin.  The following changes were made. Stop Strattera. OK restart stimulant bc severe ADD Restart Adderall 1 daily for a few days and if tolerated then restart 1 twice daily. If not tolerated reduce the dosage if needed. May need to stop Wellbutrin if not tolerating the stimulant.  Yes.  DC Wellbutrin Restart Prozac 20 mg daily.  February 2021 appointment with the following noted: Completed grant proposal.  Couldn't doit without Adderall.  Sold a bunch of work.   Adderall XR lasts about 3 pm.  Strength seems about right.  BP been OK.  Not jittery.   Stopped Wellbutrin but had no SE. Mood drastically better with grant proposal and back on fluoxetine.  Less depressed and lethargic.  No anxiety.  Cut back on coffee. Started back with devotions and stronger faith. Plan: Continue Prozac 20 mg daily. May have to increase the dose at some point in the future given that she usually was taking higher dosages but she is getting good response at this time. Restart Wellbutrin off label for ADD since can't get 2 ADDERALL daily. 150 mg daily then 300 mg daily. She can adjust the dose between 150 mg and 300 mg daily to get the optimal effect.   05/11/2020 appointment with the following noted: Has been inconsistent with Prozac and Wellbutrin. Not sure of the effect of Wellbutrin. Biggest deterrent in work is  anxiety.  Some of the work is conceptual and difficult at times.  Can feel she's not up to a project at times.  Overall is OK but would like a steadier benefit from stimulant.  Exhausted from managing concentration and keeping up with things from the day.  Loses things.  Not good keeping up with schedule. Overall productive and emotionally OK. Can feel Adderall wear off. Mood is better in summer and worse in the winter.   F died in 10/14/2023 and that is a loss. No SE Wellbutrin. Still attends AA meetings.  Real benefit from [place] last year. Recognizes effect of anemia on ADD and mood.  Had iron infusions last winter. Plan:  Wellbutrin off label for ADD since can't get 2 ADDERALL daily. 150 mg daily then 300 mg daily.  01/24/2021 appointment with following noted: Doing a program called [employer] mindfulness app since Xmas.  CBT app helped the depression.  App helped her focus better.  Lost sign weight. Writing a lot. Before Xmas felt depressed and started negative thinking worse, self denigrating. Not drinking. More isolated.   Recognizes mo is narcissist.    Didn't tell anyone she was born until 3 mos later.  M aloof and uninterested in pt.  Lied about her birthday.  Mo lack of affection even with pt's kids. Going to [place] for a year and it helped her to quit drinking. Also misses  kids being gone with a hole also. Plan: No med changes  05/04/2021 appointment with the following noted: Therapist Paula Pile thinks she's manic. Lost weight to 144#.   States she is still sleeping okay.  Admits she is hyper and recognizes that she is likely manic.  She feels great, euphoric with an increased sense of spiritual connectedness to God.  She has racing thoughts and talks fast and talks a lot and this is noted by her husband.  He thinks she is a bit hyper.  She has been able to maintain sobriety although she will have 1 glass of wine on special occasions but does not drink by herself.  She is not  drinking to excess.  She denies any dangerous impulsivity.  She is clearly not depressed and not particularly anxious.  She has no concerns about her medication and she has been compliant.  06/16/21 appt noted: So much better.  Going through a lot but the manic thing happened on top of it.  So much slower.  Didn't feel like losing anything with risperidone.  Likes the Adderalll at 10 mg. Some drowsiness in the AM and very drowsy from risperidone 2 mg HS. Prayer life is better. Handling stress better. Less depressed with risperidone. Still likes trazodone. Sleeps well. Plan: Reduce Prozac to 10 mg daily.  Consider stopping it because it can feel the mania however she is reluctant to do that because she fears relapse of depression. Reduce risperidone to 1.5 mg nightly due to side effects.  Discussed risk of worsening mania.  07/25/2021 appointment with the following noted: Misses the Adderall and hard to function without it. Depressed now. Heavy chest.  Anxious and guilty.  Body feels heavy.   Hates Wellbutrin.   Plan: Increase fluoxetine to 20 mg daily Add Abilify 1/2 of 15 mg tablet daily Wean wellbutrin by 1 tablet each week  bc she feels it is not helpful and DT polypharmacy Reduce risperidone to 1 daily for 1 week and stop it. Disc risk of mania. Increase Adderall to XR 20 mg AM  08/08/21 Much less depressed and starting to feel normal I feel a lot better. No SE.  Speech normal off risperidone. Sleeping OK on trazaodone and enough.   Noticed benefit from Adderall again. Plan: continue fluoxetine to 20 mg daily Continue Abilify 1/2 of 15 mg tablet daily for depression and mania continue Adderall to XR 20 mg AM  10/10/2021 phone call: Pt stated she feels like the Abilify should be decreased to 5mg.She said she is depressed but rational and not suicidal.She has an appt Monday and can wait until then if you prefer. MD response: Reduce the Abilify to 7.5 mg every other day.  We will meet on  10/16/2021 and decide what to do from there.  10/16/2021 appointment with the following noted: More depressed.  Most depressed I've ever been.  Just numb.  Sense of grief.   Thinks the manic episode was unlike anything else she ever had.  Doesn't want to medicate against it.  Don't enjoy people.  Easily overwhelmed.  Had some death thoughts but not suicidal.  Has been functional.  Feels better today after reducing Abilify to every other day but she is only been doing that for 3 days. A/P: Episode of post manic depression was explained. continue fluoxetine to 20 mg daily Hold Abilify for 1 week then resume Abilify 1/2 of 15 mg tablet every other day for depression and mania continue Adderall to XR 20 mg   AM  10/27/2021 appointment with the following noted: I'm doing so much better.  Handling the depression better. Better self talk and spiritual focus has helped.   Dep 6/10 manifesting as anxiety with low confidence.   F died 2  years ago and M 66 yo and is dependent . She is working hard to feel better but still feels depressed.  She almost feels like she has a little more anxiety since restarting Abilify every other day. Plan: continue fluoxetine to 20 mg daily DC Abilify .  Vrayalar 1.5 mg QOD to try to get rid of depression ASAP. continue Adderall to XR 20 mg AM  11/10/2021 appointment with the following noted: Busy with Xmas and it was fun with family but then a big let down.  Did well with it.  Functioned well with it.  Working hard on things with depression.  Not shutting down. Not sure but feels better today but yesterday was hard.  Difficulty dealing with mother.  She won't do anything to help herself.  Yesterday with her all day.  Won't do PT and has isolated herself.    Lack of confidence.   No SE with Vraylar.  11/24/21 urgent appointment appt noted: More and more depressed.   So anxious and doesn't want to be alone but can do so. No appetite. Hurts inside. Has had some fleeting  suicidal thoughts but would not act on them.  Tolerating meds. Has been consistent with Vraylar 1.5 mg every other day, fluoxetine 20 mg daily Plan: Increase Vraylar to 1.5 mg daily Change Prozac to Trintellix 10 mg daily. Discussed side effects of each continue Adderall to XR 20 mg AM  12/27/2021 appointment with the following noted: Not OK.  I feel less depressed but feels bat shit. Not sleeping well.  Extremely anxious. Off and on sleep. 3-4 hours of sleep.   Still having daily SI.  But also become obvious has so much to do.  Overwhelmed by tasks.   Needs anxiety meds to just function. Not more motivated.  Walked yesterday.   Feels afraid like in trouble but not irritable or angry. DC DT agitation Vraylar to 1.5 mg daily Change Prozac to Trintellix 10 mg daily. Hold Adderall to XR 20 mg AM Clonidine 0.1 1/2 tablet twice daily for 2 days and if needed for anxiety and sleep increase to 1 twice daily Ok temporary Ativan 1 mg 3 times daily as needed anxiety  01/05/22 appt noted: Off fluoxetine and  Trintellix.  Only on Ativan, trazodone and Adderall XR 20 plus added clonidine 0.1 mg BID Didn't think she needed to start Trintellix. Not taking Ativan.   Didn't like herself last week. Feels some better today. Wonders if the manic sx Not agitated.  Anxiety kind of calmed down.  A lot to be anxious about situationally.  $ stress. Concerns about downers with meds. Can't access normal personality. ? Lethargy and inability to talk as sE. Plan: Latuda 20-40 mg daily with food. Adderall to XR 20 mg AM Clonidine 0.1 1/2 tablet twice daily  reduce dose to be sure no SE Ok temporary Ativan 1 mg 3 times daily as needed anxiety  01/19/22 appt noted: Taking Latuda 20 mg daily.  Took 40 mg once and felt anxious and  SI Still depressed and not very reactive Anxiety mainly about the depression and fears of the future. She wants to revisit manic sx and thinks it was maybe bc taking delta 8 bc was  taking a lot of   it so still doesn't think she's classic bipolar. She wants to only take Prozac bc thinks Latuda is perpetuating depression. Says the delta 8 was very psychaedelic.  When not taking it was not manic.  Sleeping ok again.  Plan: Per her request DC Latuda 20-40 mg daily with food. She wants to continue Prozac alone AMA  Adderall to XR 20 mg AM Clonidine 0.1 1/2 tablet twice daily  reduce dose to be sure no SE Ok temporary Ativan 1 mg 3 times daily as needed anxiety  01/23/2022 phone call complaining of increased anxiety since stopping Latuda.  She will try increasing clonidine.  01/26/2022 phone call not feeling well and wanted to restart the Vraylar.  However notes indicate that had made her agitated therefore she was encouraged to pick up samples of Rexulti 1 mg and start that instead.  02/06/2022 phone call: Stating she felt the Rexulti was helping with depression but she was not sleeping well and obsessing over things.  She was encouraged to increase Rexulti to 2 mg daily and increase trazodone for sleep.  02/09/2022 appointment with the following noted: This was an urgent work in appointment No sleep last night with trazodone 100 mg HS Nothing really better depression or anxiety. Ruminating negative anxious thoughts. Did not tolerate Rexulti because it was causing insomnia.  Does not think it helped depression.  Lacks emotion that she should have.  Lacks her usual personality.  Some hopeless thoughts.  Some death thoughts.  Some suicidal thoughts without plan or intent Plan: DC Rexulti and Prozac & DC trazodone Adderall to XR 20 mg AM Clonidine 0.1 1/tablet twice daily  reduce dose to be sure no SE Ok temporary Ativan 1 mg 3 times daily as needed anxiety Start Seroquel XR 150 mg nightly  03/02/2022 appointment: Denise called back a few days after starting Seroquel stating it was making her more anxious and more depressed.  This seemed unlikely as this medicine rarely ever  causes anxiety.  She stopped the medication waited 3 days and called back still had anxiety and depression but thought perhaps the anxiety was a little better.  She did not want to take the Seroquel. She knew about the option of Spravato and wanted to pursue that. Now questions whether to return to Seroquel while waiting to start Spravato bc feels just as bad without it and knows she didn't give it enough time to work.   MADRS 46  ECT-MADRS    Flowsheet Row Clinical Support from 08/06/2022 in [facility] Clinical Support from 07/04/2022 in [facility] Clinical Support from 05/21/2022 in [facility] Office Visit from 03/02/2022 in [facility]  MADRS Total Score 29 15 27 46      03/14/22 appt noted: Pt received Spravato 56 mg first dose today with some dissociative sx which were not severe.  She was anxious prior to the administration and felt better after receiving lorazepam 1 mg.  No NV, or HA. Wants to continue Spravato. Ongoing depression and desperate to feel better.  I'm not myself DT deprsssion which is most severe in recent history.  Anhedonia.  Low motivation.  Social avoidance. Continues to think all recent med trials are making her worse.  Sleep ok with Seroquel.  03/16/22 appt noted: Received Spravato 84 mg for the first time.  some dissociative sx which were not severe.  She was anxious prior to the administration and felt better after receiving lorazepam 1 mg.  No NV, or HA. Wants   to continue Spravato.   Does not feel any better or different since the last appt.  Ongoing depression.  Ongoing depression and desperate to feel better.  I'm not myself DT deprsssion which is most severe in recent history.  Anhedonia.  Low motivation.  Social avoidance. Continues to think all recent med trials are making her worse.  Sleep ok with Seroquel.  Does not want to continue Seroquel for TRD.  03/20/2022 appointment noted: Came for  Spravato administration today.  However blood pressure was significantly elevated approximately 180/115.  She was given lorazepam 1 mg and clonidine 0.2 mg to try to get it down. She states she regretted stopping the Seroquel XR 300 mg tablets.  She now realizes it was helpful.  She did not sleep much at all last night.  She did not take the Adderall this morning. 2 to 3 hours after arrival blood pressure was still elevated at  170/110, 62 pulse.  For Spravato administration was canceled for today.  She admits to being anxious and depressed.  She is not suicidal.  She is highly motivated to receive the Spravato.  We discussed getting it tomorrow.  03/22/2022 appointment noted: Patient's blood pressure was never stable enough yesterday in order to get her in for Spravato administration.  She was encouraged to see her primary care doctor.  It is better today.  03/26/2022 appointment with the following noted: Blood pressure was better.  Saw her primary care doctor who started on oral Bystolic 2.5 mg daily. Received Spravato 84 mg today as scheduled.  Tolerated it well without nausea or vomiting headache or chest pain or palpitations.  Her blood pressure was borderline but manageable. She remains depressed and anxious.  She is ambivalent about the medicine and desperate to get to feel better.  Continues to have anhedonia and low energy and low motivation and reduced ability to do things.  Less social.  Not suicidal.  03/28/22 appt noted: Received Spravato 84 mg today as scheduled.  Tolerated it well without nausea or vomiting headache or chest pain or palpitations.  Her blood pressure was borderline but manageable. Has not seen any improvement so far.  Tolerating Seroquel.  Inconsistent with Bystolic and BP has been borderline high. Still depressed and anxious and anhedonia.  Low motivation, energy, productivity. Taking quetiapine and tolerating XR 300 mg nightly.  04/04/22 appt noted: Received Spravato  84 mg today as scheduled.  Tolerated it well without nausea or vomiting headache or chest pain or palpitations.  Her blood pressure was borderline but manageable. Has not seen any improvement so far.  Tolerating Seroquel.   She still tends to think that the medications are making her worse.  She has said this about each of the recent psychiatric medicines including Seroquel.  However her husband thinks she is improved.  She also admits there is some improvement in productivity.  She still feels highly anxious.  She still does not enjoy things as normal.  She still feels desperate to improve as soon as possible. Has been taking Seroquel XR since 03/20/2022  04/10/22 appt noted: Received Spravato 84 mg today as scheduled.  Tolerated it well without nausea or vomiting headache or chest pain or palpitations.  Her blood pressure was borderline but manageable. Has not seen any improvement so far.  Tolerating Seroquel.  Doesn't like Seroquel bc she thinks it flattens here. Ongoing depression without confidence Plan: Start Auvelity 1 every morning for persistent treatment resistant depression  04/12/2022 appointment with the following noted:  Received Spravato 84 mg today as scheduled.  Tolerated it well without nausea or vomiting headache or chest pain or palpitations.  Her blood pressure was borderline but manageable. Has not seen any improvement so far.  Tolerating Seroquel.  Doesn't like Seroquel bc she thinks it flattens her. Received Spravato 84 mg today as scheduled.  Tolerated it well without nausea or vomiting headache or chest pain or palpitations.  Her blood pressure was borderline but manageable. Has not seen any improvement so far.  Tolerating Seroquel.  Doesn't like Seroquel bc she thinks it flattens here.  We discussed her ambivalence about it. She is starting Auvelity and has tolerated it the last 2 days without side effect.  She still does not feel like herself and feels flat and not enjoying  things with suppressed expressed emotion  04/17/2022 appointment with the following noted: Received Spravato 84 mg today as scheduled.  Tolerated it well without nausea or vomiting headache or chest pain or palpitations.  Her blood pressure was borderline but manageable. Has not seen any improvement so far.  Tolerating Seroquel.  Doesn't like Seroquel bc she thinks it flattens her. She has been tolerating the Auvelity 1 in the morning without side effects for about a week.  She has not noticed significant improvement so far.  She still feels depressed and flat and not herself.  Other people notice that she is flat emotionally.  She is not suicidal.  She does feel discouraged that she is not getting better yet.  04/19/2022 appointment noted: Has increased Auvelity to 1 twice daily for 2 days, continues quetiapine XR 300 mg nightly, clonidine 0.3 mg twice daily, lorazepam 1 mg twice daily for anxiety and Adderall XR 20 mg in the morning. No obious SE but she still thinks quetiapine XR is making her feel down.  But not sedated Received Spravato 84 mg today as scheduled.  Tolerated it well without nausea or vomiting headache or chest pain or palpitations.  Her blood pressure was borderline but manageable. She still feels quite anxious and feels it necessary to take both the clonidine and lorazepam twice a day to manage her anxiety.  She has been consistently down and flat and not herself until yesterday afternoon she noted an improvement in mood and feeling much more like herself with her normal personality reemerging.  She was quite depressed in the morning with very dark negative thoughts.  She did not have those dark negative thoughts this morning.  She had a lot of questions about medication and when she was expecting to be improved and why she has not shown improvement up to now.  04/23/22 appt noted: Has increased Auvelity to 1 twice daily for 1 week, continues quetiapine XR 300 mg nightly, clonidine 0.3  mg twice daily, lorazepam 1 mg twice daily for anxiety and Adderall XR 20 mg in the morning. No obious SE but she still thinks quetiapine XR is making her feel down.  But not sedated Received Spravato 84 mg today as scheduled.  Tolerated it well without nausea or vomiting headache or chest pain or palpitations.  She is still depressed but admits better function and is able to enjoy social interactions. Tolerating meds.  Would like to feel better for sure. Not herself.  Flat. Plan increase Auvelity to 1 tab BID as planned and reduce Quetiapine to 1/2 of ER 300 mg  bc NR for depression.  04/25/2022 appointment with the following noted: clonidine 0.3 mg twice daily, lorazepam 1 mg twice daily   for anxiety and Adderall XR 20 mg in the morning. Seroquel XR 300 HS No obious SE but she still thinks quetiapine XR is making her feel down.  But not sedated Received Spravato 84 mg today as scheduled.  Tolerated it well without nausea or vomiting headache or chest pain or palpitations.  Called yesterday with more anxiety.  Had increased Auvelity for 1 day and reduced Seroquel XR for 1 day.  Felt restless and fearful  05/01/2022 appointment noted: clonidine 0.3 mg twice daily, lorazepam 1 mg twice daily for anxiety and Adderall XR 20 mg in the morning. Seroquel XR 150 HS, Auvelity 1 BID Received Spravato 84 mg today as scheduled.  Tolerated it well without nausea or vomiting headache or chest pain or palpitations.  Nurse has noted patient has called multiple times sometimes asking the same question repeatedly.  It is unclear whether she is truly forgetful or is just anxious seeking reassurance. Patient acknowledges ongoing depression as well as some anxiety but states she has felt a little better in the last couple of days.  She has reduced the Seroquel to 150 mg at night and has increased Auvelity to 1 twice daily but only for 1 day.  So far she seems to be tolerating it.  05/03/22 appt noted: clonidine 0.2 mg  twice daily, lorazepam 1 mg twice daily for anxiety and Adderall XR 20 mg in the morning. Seroquel XR 150 HS, Auvelity 1 BID BP high this am about 170/100 and received extra clonidine 0.2 mg and came to receive Spravato.  Not dizzy, no SOB, nor CP but BP is still high Could not receive Spravato today bc BP high and pulse low at 30 ppm. Still depressed and anxious. Plan: continue trial Auvelity with Spravato She needs to get BP and pulse managed  05/08/22 TC: RTC  H Michael NA and mailbox full.  Could not leave message.  Pt  -  talked to she and H on speaker. H worried over wife.  Vacant stare.  Slurs words at times.  Not smiling. Reduced enjoyment.  Depression.  Withdrawn from usual activities.  Some irritability.  Anxious. Disc her concerns meds are making her worse.  Extensive discussion about her treatment resistant status.  There is a consistent pattern of not taking the medicines long enough to get benefit because she believes the meds are making her worse.  However the symptoms she describes as side effects are exactly the same symptoms that she had prior to taking the medication RX for  the depression.  So it is not clear that these are actual side effects. This is true about the 2 most recent meds including Seroquel and Auvelity.  Recommend psychiatric consultation in hopes of improving her comfort level with taking prescribed medications for a sufficient length of time to provide benefit. Extensive discussion about ECT is the treatment of choice for treatment resistant depression.  Spravato may work if she can comply with consistency.  There are medication options but they take longer to work.   Plan:  Reduce clonidine to 0.1 mg BID DT bradycardia.  Talk with PCP about BP and low pulse problems which are interfering with her consistent compliance with Spravato.   Limit lorazepam to 3 -4  mg daily max. Excess use is the cause of slurring speech.  She must stop excess use or will have to stop  the med. Stop Auvelity per her request.  But she has only been on the full dose for a little over  a week and clearly has not had time to get benefit from it.  She thinks maybe it is making her more anxious. Reduce Seroquel from 150XR to 50 -100 mg at night IR.  She couldn't sleep when stopped it completely. Will not start new antidepressant until her SE issues are resolved or not. Get second psych opinion from Chris Aiken MD or another psychiatrist.  H's sister is therapist in [place] Birdie Beveridge Cottle, MD, DFAPA  05/16/2022 appointment with the following noted: Received Spravato 84 mg today as scheduled.  Tolerated it well without nausea or vomiting headache or chest pain or palpitations.  She stopped Auvelity as discussed last week. On her own, without physician input, she restarted Wellbutrin XL 450 mg every morning today.  She had taken it in the past.  She feels jittery and anxious. She feels less depressed than she did last week.  But she is still depressed without her usual range of affect.  She still is less social and less motivated than normal. Her primary care doctor increased the dose of losartan Plan: Stop Seroquel Reduce Wellbutrin XL to 300 mg every morning.  Starting the dose at 450 every morning is likely causing side effects of jitteriness and it should not be started at that have a dosage. Recommend she not change meds on her own without MDM put  05/23/2022 appointment with the following noted: Received Spravato 84 mg today as scheduled.  Tolerated it well without nausea or vomiting headache or chest pain or palpitations.  Has not dropped seroquel XR 300 mg 1/2 tablet nightly bc couldn't sleep without it. Has not tried lower dose quetiapine 50 mg HS Still feels depressed.   BP is better managed so far, just saw PCP.  BP is better today and infact is low today. Dropped clonidine as directed from 0.3 mg BID bc inadequate control of BP to 0.2 mg BID.  However she wants to increase it  back to 0.3 mg twice daily because she feels it helped her anxiety better.  Wonders about increasing Wellbutrin for depression.  However she has only been on 300 mg a day for a week.  She was on 450 mg daily in the past.  06/06/22 appt noted: Received Spravato 84 mg today as scheduled.  Tolerated it well without nausea or vomiting headache or chest pain or palpitations.  She is still depressed and anxious.  She wants to try to stop the Seroquel but cannot sleep without some of it.  She is taking lorazepam 1 mg 4 times daily and still having a lot of anxiety.  She wants to increase clonidine back to 0.3 mg twice daily.  She hopes for more improvement She recently went for a second psychiatric opinion as suggested the results of that are pending.  06/11/22 appt noted: Received Spravato 84 mg today as scheduled.  Tolerated it well without nausea or vomiting headache or chest pain or palpitations.  She is still depressed and anxious. Without much change.  Still hopeless, anhedonia, reduced inteterest and motivation.  Tolerating meds. Disc concerns Spravato is not hleping much. Plan: stop Seroquel and start olanzapine 10 mg HS for TRD and anxiety.  06/13/2022 appointment noted: Received Spravato 84 mg today as scheduled.  Tolerated it well without nausea or vomiting headache or chest pain or palpitations.  She is still depressed and anxious. Without much change.  Still hopeless, anhedonia, reduced inteterest and motivation.  Tolerating meds. Disc concerns Spravato is not helping much as hoped but is   improving a bit in the last week. Tolerating meds. Continues Wellbutrin XL 450 AM, tolerating recently started olanzapine  10 mg HS. Sleep is good.   Pending appt with TMS consult.  06/18/22 appt noted: Received Spravato 84 mg today as scheduled.  Tolerated it well without nausea or vomiting headache or chest pain or palpitations.  Tolerating meds. Continues Wellbutrin XL 450 AM, tolerating recently started  olanzapine  10 mg HS. Continues Adderall XR 20 amd and has tried to reduce lorazepam to 1mg TID Sleep is good.   Pending appt with TMS consult. Depression is a little bit better in the last week with a little improvement in emotional expression and interest.  She is pushing herself to be more active.  Her daughter thought she was a little better than she has been.  However she is still depressed and still not her normal self with anhedonia and reduced emotional expressiveness.  06/20/22 appt noted: Received Spravato 84 mg today as scheduled.  Tolerated it well without nausea or vomiting headache or chest pain or palpitations.  Tolerating meds with a little sleepiness. Continues Wellbutrin XL 450 AM, tolerating recently started olanzapine  10 mg HS. Continues Adderall XR 20 amd and has tried to reduce lorazepam to 1mg TID Sleep is good.   Mood is improving.  Better funciton.  Anxiety is better with olanzapine. Still not herself and depression not gone with some anhedonia and social avoidance and feeling overwhelmed.  8/14 2023 received Spravato 84 mg 06/27/2022 received Spravato Spravato 84 mg 07/02/2022 received Spravato 84 mg 07/04/2022 received Spravato 84 mg  07/09/2022 appointment noted: Received Spravato 84 mg today as scheduled.  Tolerated it well without nausea or vomiting headache or chest pain or palpitations.  Expected dissociation and feels less depressed with resolution of negative emotions immediately after Spravato and then depression, anxiety creep back in. Continues meds Adderall XR 20 mg every morning, Wellbutrin XL 450 every morning, clonidine 0.1 mg twice daily, lorazepam 1 mg every 6 hours as needed, olanzapine increased from 7.5 to 10 mg nightly on  Tolerating meds.  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  She does note some increased appetite and is  somewhat concerned about that but has not gained significant amounts of weight. She has had the TMS consultation which was initially denied but she knows it can be appealed.  However because she is improving with Spravato plus the other medications now she wants to continue the current treatment plan.  07/18/22 appt noted: Continues meds Adderall XR 20 mg every morning, Wellbutrin XL 450 every morning, clonidine 0.1 mg twice daily, lorazepam 1 mg every 6 hours as needed, olanzapine increased from 7.5 to 10 mg nightly on 07/04/2022. Received Spravato 84 mg today as scheduled.  Tolerated it well without nausea or vomiting headache or chest pain or palpitations.  Expected dissociation and feels less depressed with resolution of negative emotions immediately after Spravato and then depression, anxiety creep back in. Continues meds Adderall XR 20 mg every morning, Wellbutrin XL 450 every morning, clonidine 0.1 mg twice daily, lorazepam 1 mg every 6 hours as needed, olanzapine increased from 7.5 to 10 mg nightly on  Tolerating meds.  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  She does note some increased appetite and   is somewhat concerned about that but has not gained significant amounts of weight. She has had the [place] consultation which was initially denied but she knows it can be appealed. She continues to have chronic ambivalence about psychiatric medicines and initially tends to blame her depressive symptoms such as decreased concentration and feeling flat on what ever medicine she currently is taking even though she had the same symptoms before the current medicines were started.  Then after discussion she does admit that her depressive symptoms are improved since adding olanzapine but still has those residual symptoms noted.  07/23/22 received Spravato 84 mg   07/30/2022 appointment  noted: Received Spravato 84 mg today as scheduled.  Tolerated it well without nausea or vomiting headache or chest pain or palpitations.  Expected dissociation and feels less depressed with resolution of negative emotions immediately after Spravato and then depression, anxiety creep back in. Continues meds Adderall XR 20 mg every morning, Wellbutrin XL 450 every morning, clonidine 0.1 mg twice daily, lorazepam 1 mg every 6 hours as needed, olanzapine increased from 7.5 to 10 mg nightly on  She has been inconsistent with olanzapine because she continues to be ambivalent about the medications in general and thinks that perhaps the 10 mg is making her feel blunted.  She continues to feel some depression.  She had a good day this week and but still feels somewhat depressed and persistently anxious. Plan: be consistent with olanzapine 10 mg HS for TRD and longer trial for potential benefit for anxiety.  Has not taken it consistently.  08/06/2022 appointment noted: Received Spravato 84 mg today as scheduled.  Tolerated it well without nausea or vomiting headache or chest pain or palpitations.  Expected dissociation and feels less depressed with resolution of negative emotions immediately after Spravato and then depression, anxiety creep back in. Continues meds Adderall XR 20 mg every morning, Wellbutrin XL 450 every morning, clonidine 0.1 mg twice daily, lorazepam 1 mg every 6 hours as needed, olanzapine i 10 mg nightly  She continues to feel depressed but is about 50% better with Spravato.  She is still not herself.  She still has anhedonia.  She still is not her able to engage socially in the typical ways.  She is not jovial and outgoing like normal.  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  She continues to feel that her personality is dampened down.  There is a question about whether it is  related to depression or medication. Plan: continue olanzapine 10 for longer trial for TRD and severe anxiety.  08/13/22 appt noted:  Received Spravato 84 mg today as scheduled.  Tolerated it well without nausea or vomiting headache or chest pain or palpitations.  Expected dissociation and feels less depressed with resolution of negative emotions immediately after Spravato and then depression, anxiety creep back in. Continues meds Adderall XR 20 mg every morning, Wellbutrin XL 450 every morning, clonidine 0.1 mg twice daily, lorazepam 1 mg every 6 hours as needed, olanzapine i 10 mg nightly  She still does not feel herself.  Still struggles with depression and low motivation and reduced social engagement and reduced interest and reduced emotional expression.  She is somewhat better with the medicines plus Spravato.  She still believes the Spravato makes her blunted and is not sure how much it helps her anxiety.  She can have good days when  her family is around and she is engaged.  She still wants to stop the olanzapine. She has apparently continued to take the trazodone despite having been told to stop it when she started olanzapine.  She feels like she needs the trazodone. Plan: DC olanzapine and Start nortriptyline 25 mg nightly and build up to 75 mg nightly and then check blood level.    08/27/2022 appointment noted: Received Spravato 84 mg today as scheduled.  Tolerated it well without nausea or vomiting headache or chest pain or palpitations.  Expected dissociation and feels less depressed with resolution of negative emotions immediately after Spravato and then depression, anxiety creep back in. Continues meds Adderall XR 20 mg every morning, Wellbutrin XL 450 every morning, clonidine 0.1 mg twice daily, lorazepam 1 mg every 6 hours as needed. Stopped olanzapine and started nortriptyline which she has taken for about a week is 75 mg nightly. So far she is tolerating the nortriptyline well with the  exception of some dry mouth and constipation which she is working to manage.  She does not feel substantially better better or different off the olanzapine.  No change in her sleep which is good.  Main concern currently in addition to the residual depression is anxiety which is somewhat situational with pending arch show.  She is worrying about it more than normal.  Says she is having to take lorazepam twice a day where she had been able to keep reduce it prior to this.  She still does not feel like herself with residual depression with less social interest and less of her usual buoyancy in personality.  She is flatter than normal.  Overall she still feels that the Spravato has been helpful at reducing the severity of the depression.  She is not suicidal. She has not heard anything about the TMS appeal as of yet.  09/05/2022 appointment noted: Received Spravato 84 mg today as scheduled.  Tolerated it well without nausea or vomiting headache or chest pain or palpitations.  Expected dissociation and feels less depressed with resolution of negative emotions immediately after Spravato and then depression, anxiety creep back in. Continues meds Adderall XR 20 mg every morning, Wellbutrin XL 450 every morning, clonidine 0.1 mg twice daily, lorazepam 1 mg every 6 hours as needed. Stopped olanzapine and started nortriptyline which she has taken for about 2 week is 75 mg nightly. Initially blood pressure was a little high causing delay in starting Spravato.  She admitted to feeling a little wound up.  She still experiences a little increase in depression if she goes longer than a week in between doses of Spravato.  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  09/10/22 appt noted: Received Spravato 84 mg today as scheduled.  Tolerated it well without nausea or vomiting headache or chest pain or palpitations.   Expected dissociation gradually resolved over the 2 hour observation period. She feels 50% less depressed with Spravto and wants to continue it.   Continues meds Adderall XR 20 mg every morning, Wellbutrin XL 450 every morning, clonidine 0.1 mg twice daily, lorazepam 1 mg every 6 hours as needed. Has started nortriptyline 75 mg nightly for about 3 weeks. Has not seen a significant difference with the addition of nortriptyline.  Tolerating it pretty well. She continues to have some degree of anhedonia and significant depression and anxiety.  Her daughters noticed that   she is more needy and calls more frequently.  She acknowledges this as well.  She is clearly still not herself. Plan: pramipexole off label and RX 0.25 mg BID  09/17/2022 appointment noted: Received Spravato 84 mg today as scheduled.  Tolerated it well without nausea or vomiting headache or chest pain or palpitations.  Expected dissociation gradually resolved over the 2 hour observation period. She feels 50% less depressed with Spravto and wants to continue it.   Continues meds Adderall XR 20 mg every morning, Wellbutrin XL 450 every morning, clonidine 0.1 mg twice daily, lorazepam 1 mg every 6 hours as needed. Has started nortriptyline 75 mg nightly for about 3 weeks and DT level 176, reduced to 50 mg HS early November. Still the same sx as noted last visit.  Tolerating meds.   Compliant.  Still depressed and family notices.  Has been able to participate in family interactions.  Some post-show let down and has to do detailed work which is hard for her bc ADD.  Sleep and eating well.  Energy OK but not great.  No SI.  Not cried in a year or so.  Clearly less depressed and hopeless than before the Spravato.   ECT-MADRS    Flowsheet Row Clinical Support from 08/06/2022 in [place] from 07/04/2022 in [place] from 05/21/2022 in [place] Visit  from 03/02/2022 in [facility]  MADRS Total Score _0 46        Past Psychiatric Medication Trials: fluoxetine, duloxetine, Viibryd, lamotrigine, Pristiq, sertraline, citalopram,  Trintellix anxious and SI Wellbutrin XL 450 [place] 1 dose  Adderall, Adderall XR, Vyvanse, Ritalin, Strattera low dose NR Lorazepam Trazodone  Depakote,  lamotrigine cog complaints Lithium remotely Abilify 7.5  Vraylar 1.5 mg daily agitation and insomnia Rexulti insomnia Latuda 40 one dose, CO anxious and SI Seroquel XR 300 Olanzapine 10  At visit November 12, 2019. We discussed Patient developed an increasingly severe alcohol dependence problem since her last visit in January.  She went to [facility] and has had no alcohol since then except 1 day.  She never abused stimulants but they took her off the stimulants at [facility].  Her ADD was markedly worse.  The Wellbutrin did not help the ADD.   D history lamotrigine rash at 66 yo  Review of Systems:  Review of Systems  Constitutional:  Positive for fatigue.  Cardiovascular:  Negative for palpitations.  Musculoskeletal:  Positive for arthralgias and back pain. Negative for joint swelling.       SP hip surgery October 2020  Neurological:  Negative for tremors.  Psychiatric/Behavioral:  Positive for decreased concentration and dysphoric mood. Negative for agitation, behavioral problems, confusion, hallucinations, self-injury, sleep disturbance and suicidal ideas. The patient is nervous/anxious. The patient is not hyperactive.     Medications: I have reviewed the patient's current medications.  Current Outpatient Medications  Medication Sig Dispense Refill   amLODipine (NORVASC) 2.5 MG tablet Take 2.5 mg by mouth daily.     amphetamine-dextroamphetamine (ADDERALL XR) 20 MG 24 hr capsule Take 1 capsule (20 mg total) by mouth every morning. 30 capsule 0   amphetamine-dextroamphetamine (ADDERALL XR) 20 MG 24 hr  capsule Take 1 capsule (20 mg total) by mouth every morning. 30 capsule 0   buPROPion (WELLBUTRIN XL) 150 MG 24 hr tablet TAKE 3 TABLETS BY MOUTH DAILY 270 tablet 1   cloNIDine (CATAPRES) 0.2 MG tablet Take 1 tablet (0.2  mg total) by mouth 2 (two) times daily. 60 tablet 1   iron polysaccharides (NIFEREX) 150 MG capsule TAKE 1 CAPSULE BY MOUTH EVERY DAY 90 capsule 1   LORazepam (ATIVAN) 1 MG tablet Take 1 tablet (1 mg total) by mouth every 8 (eight) hours as needed. for anxiety 90 tablet 0   losartan (COZAAR) 50 MG tablet Take 50 mg by mouth daily.     nebivolol (BYSTOLIC) 2.5 MG tablet Take 2.5 mg by mouth daily.     nortriptyline (PAMELOR) 25 MG capsule Take 3 capsules (75 mg total) by mouth at bedtime. (Patient taking differently: Take 50 mg by mouth at bedtime.) 90 capsule 0   SPRAVATO, 84 MG DOSE, 28 MG/DEVICE SOPK USE 3 SPRAYS IN EACH NOSTRIL TWICE A WEEK 3 each 3   traZODone (DESYREL) 50 MG tablet 1-2 tablets nightly as needed for sleep 60 tablet 1   pramipexole (MIRAPEX) 0.25 MG tablet Take 1.5 tablets (0.375 mg total) by mouth 2 (two) times daily. 90 tablet 0   No current facility-administered medications for this visit.    Medication Side Effects: None  Allergies:  Allergies  Allergen Reactions   Metronidazole Shortness Of Breath and Other (See Comments)    Heart pounding   Ferrlecit [Na Ferric Gluc Cplx In Sucrose] Other (See Comments)    Infusion reaction 05/12/2019    Past Medical History:  Diagnosis Date   ADHD    Anemia    Anxiety    Arthritis    Depression    Heart murmur    i went to see a cardiologit slast eyar  and i had zero plaque,    PONV (postoperative nausea and vomiting)    Recovering alcoholic in remission ([place])     Family History  Problem Relation Age of Onset   Atrial fibrillation Mother    CAD Father     Past Medical History, Surgical history, Social history, and Family history were reviewed and updated as appropriate.   Please see review of  systems for further details on the patient's review from today.   Objective:   Physical Exam:  There were no vitals taken for this visit.  Physical Exam Constitutional:      General: She is not in acute distress. Neurological:     Mental Status: She is alert and oriented to person, place, and time.     Coordination: Coordination normal.     Gait: Gait normal.  Psychiatric:        Attention and Perception: Attention and perception normal.        Mood and Affect: Mood is anxious and depressed. Affect is not labile, blunt, angry or tearful.        Speech: Speech is not rapid and pressured or slurred.        Behavior: Behavior is not slowed.        Thought Content: Thought content is not paranoid or delusional. Thought content does not include homicidal or suicidal ideation. Thought content does not include suicidal plan.        Cognition and Memory: Cognition normal. Memory is not impaired. She does not exhibit impaired recent memory.        Judgment: Judgment normal.     Comments: Insight intact. No auditory or visual hallucinations. No delusions.  Depression ongoing residual. Affect still depressed but less blunted than it was before Spravato.  Still blunted No Sui intent plan      Lab Review:     Component Value  Date/Time   NA 137 01/12/2021 1430   NA 140 11/18/2018 1544   K 3.8 01/12/2021 1430   CL 108 01/12/2021 1430   CO2 22 01/12/2021 1430   GLUCOSE 94 01/12/2021 1430   BUN 14 01/12/2021 1430   BUN 20 11/18/2018 1544   CREATININE 0.82 01/12/2021 1430   CALCIUM 8.9 01/12/2021 1430   PROT 6.6 01/12/2021 1430   ALBUMIN 3.9 01/12/2021 1430   AST 12 (L) 01/12/2021 1430   ALT 11 01/12/2021 1430   ALKPHOS 46 01/12/2021 1430   BILITOT 0.5 01/12/2021 1430   GFRNONAA >60 01/12/2021 1430   GFRAA >60 09/02/2019 0249   GFRAA >60 01/27/2019 0811       Component Value Date/Time   WBC 4.5 01/12/2021 1430   RBC 4.32 01/12/2021 1430   HGB 12.8 01/12/2021 1430   HGB 12.9  07/17/2019 0953   HCT 38.5 01/12/2021 1430   HCT 21.9 (L) 12/25/2018 1221   PLT 272 01/12/2021 1430   PLT 286 07/17/2019 0953   MCV 89.1 01/12/2021 1430   MCH 29.6 01/12/2021 1430   MCHC 33.2 01/12/2021 1430   RDW 12.4 01/12/2021 1430   LYMPHSABS 1.4 01/12/2021 1430   MONOABS 0.4 01/12/2021 1430   EOSABS 0.0 01/12/2021 1430   BASOSABS 0.0 01/12/2021 1430    No results found for: "POCLITH", "LITHIUM"   No results found for: "PHENYTOIN", "PHENOBARB", "VALPROATE", "CBMZ"   .res Assessment: Plan:    Recurrent major depression resistant to treatment ([place]) - Plan: pramipexole (MIRAPEX) 0.25 MG tablet  Generalized anxiety disorder  Attention deficit hyperactivity disorder (ADHD), predominantly inattentive type  Insomnia due to mental condition  Accelerated hypertension   She has treatment resistant major depression at this time.  Have  discussed some of her recent abnormal behaviors leading to this depressive episode getting worse which she says were associated with heavy use of delta 8 and not a manic episode.  She realizes now that that was not good for her.  She stopped all use of other drugs including those available over-the-counter such as delta 8 or any other THC related products.  She is no longer having any of those types of behaviors and instead is depressed.  She remains depressed with lack of interest and lack of feeling for things that normally she would have feelings about.  She has low motivation and energy.  She is difficulty making decisions.  She is sad and down.  She is less productive than usual.  Her concentration is poor.  She has high degree of anxiety as well.  Her personality is more flat than usual. She has a very negative self-image and low self-esteem.  These are all uncharacteristic for her However all of these symptoms are partially improved through Spravato and the switch from Seroquel to olanzapine.  She is approximately 50% better.  Patient was  administered Spravato 84 mg intranasally today.  The patient experienced the typical dissociation which gradually resolved over the 2-hour period of observation.  There were no complications.  Specifically the patient did not have nausea or vomiting or headache.  Blood pressures remained within normal ranges at the 40-minute and 2-hour follow-up intervals.  By the time the 2-hour observation period was met the patient was alert and oriented and able to exit without assistance.  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  See nursing note for further details.She wants to continue Spravato. We discussed discussed  the side effects in detail as well as the protocol required to receive Spravato.   Failed multiple antidepressants.  Many of them were not actual failures but intolerances and it is unclear whether some of that was more connected with anxiety than true side effects.  1 example is the [place].  In general she does not want to try anything but an antidepressant but has failed all major categories of antidepressants except TCAs and MAO inhibitors which have not been tried.  Objectively nor subjectively any different since stopping olanzapine.  In its place the next logical antidepressant would be to use a tricyclic antidepressant as she has not taken 1 of these medications before. Started nortriptyline 75 mg nightly.  Serum level 176.  Reduced to 50 mg HS  Discussed side effects in detail.  Needs more time to help. Extensive discussion previously about her ambivalence about meds and missatributing sx of depression as SE of meds.    continue Wellbutrin XL 450 mg AM  Started Spravato 84 mg twice weekly on 03/16/2022.  Now on weekly administration  Adderall  XR 20 mg AM  Ok temporary Ativan 1 mg 3-4 times daily as needed anxiety but try to cut it back. Is not ideal to use benzodiazepine with stimulant but  because of the severity of her symptoms it has been necessary.  Hope to eventually eliminate the benzodiazepine.  Expected as her depression improves her anxiety will improve as well.  However lately her anxiety has been unmanageable.  We will expect that to improve as the depression improves.  She has headed insturctions to reduce this.  Continue clonidine 0.2 mg BID off label for anxiety and helps BP partially. BP is better controlled but not consistent.  Consider increasing amlodipine.  Also on losartan 50  Discussed potential benefits, risks, and side effects of stimulants with patient to include increased heart rate, palpitations, insomnia, increased anxiety, increased irritability, or decreased appetite.  Instructed patient to contact office if experiencing any significant tolerability issues. She wants to return to usual dose of Adderall for ADD bc of mor poor cognitive function with reduction.  Also discussed that depression will impair cognitive function.  [facility] consultation was initially denied but she understands is being appealed if needed.  She has not called about this and is encouraged to make this call to help determine the next steps.  Extensive discussion about her chronic ambivalence about psychiatric medicines and tendency to blame medicine for the symptoms of depression that she had prior to even starting the medications.  After discussing this at length she was able to acknowledge that that is factual.  She does not appear to be having any significant side effects with the current medications.  However so far not much benefit with nortriptyline despite adequate level.  Reviewed options for TRD and easiest to pursue with low risk is pramipexole off label and RX 0.25 mg BID without effect for 1 week so increase to 0.375 mg daily.  Has Maintained sobriety  FU with Spravato weekly   Lynder Parents, MD, DFAPA     Please see After Visit Summary for patient specific  instructions.  No future appointments.            No orders of the defined types were placed in this encounter.      -------------------------------

## 2022-09-18 NOTE — Progress Notes (Signed)
NURSES NOTE:   Patient arrived for her 40th Spravato treatment. Pt is being treated for Treatment Resistant Depression, pt will be receiving 84 mg which will continue to be her maintenance dose, she receives weekly treatments.  Pt is also planning on [place] as soon as insurance will approve her treatments, then she will stop Spravato at that time. Patient arrived and taken to treatment room. Confirmed she had a ride home which is her husband would be coming back to pick up pt when done and sometimes she needs to use [facility] if he is unable to pick her up. Pt's Spravato is ordered through [employer] and delivered to office, all Spravato medication is stored at doctors office per REMS/FDA guidelines. The medication is required to be locked behind two doors per FDA/REMS Protocol. Medication is also disposed of properly per regulations.      Began taking patient's vital signs at 9:50 AM 141/94, pulse 61, Pulse Ox 93%. Gave patient first dose 28 mg nasal spray, each nasal spray administered in each nostril as directed and waited 5 minutes between the second and third dose. All 3 doses given pt did not complain of any nausea/vomiting, given a cup of water due to the taste after the administration of Spravato.  She listens to [employer] with spa or relaxing music.  Checked 40 minute vitals at 10:40 AM, 121/79, pulse 58, Pulse Ox 96%. Explained she would be monitored for a total time of 120 minutes. Discharge vitals were taken at 12:00 PM 124/92 P 76, 96% Pulse Ox. Dr. Clovis Pu met with pt today and discussed her medication and her plans for [place]. I walked pt to elevator, where her husband met her for the ride home. Recommend she go home and sleep or just relax on the couch. No driving, no intense activities. Verbalized understanding. Nurse was with pt a total of 70 minutes for clinical. Pt is coming back next week, Monday. Pt instructed to call office with any problems or questions.      LOT [id number] EXP AUG 2025

## 2022-09-24 ENCOUNTER — Ambulatory Visit (INDEPENDENT_AMBULATORY_CARE_PROVIDER_SITE_OTHER): Payer: 59 | Admitting: Psychiatry

## 2022-09-24 ENCOUNTER — Ambulatory Visit: Payer: 59

## 2022-09-24 VITALS — BP 106/77 | HR 80

## 2022-09-24 DIAGNOSIS — F5105 Insomnia due to other mental disorder: Secondary | ICD-10-CM

## 2022-09-24 DIAGNOSIS — F900 Attention-deficit hyperactivity disorder, predominantly inattentive type: Secondary | ICD-10-CM

## 2022-09-24 DIAGNOSIS — F411 Generalized anxiety disorder: Secondary | ICD-10-CM | POA: Diagnosis not present

## 2022-09-24 DIAGNOSIS — F339 Major depressive disorder, recurrent, unspecified: Secondary | ICD-10-CM

## 2022-09-26 ENCOUNTER — Encounter: Payer: Self-pay | Admitting: Psychiatry

## 2022-09-26 MED ORDER — DEXTROMETHORPHAN HBR 15 MG PO CAPS: 1.0000 | ORAL_CAPSULE | Freq: Two times a day (BID) | ORAL | 0 refills | Status: DC

## 2022-09-26 NOTE — Progress Notes (Signed)
Laura Chang [id number] 1956/08/01 66 y.o.  Subjective:   Patient ID:  Laura Chang is a 66 y.o. (DOB Feb 15, 1956) female.  Chief Complaint:  Chief Complaint  Patient presents with   Follow-up   Depression   Anxiety   Fatigue   ADD     HPI Laura Chang presents to the office today for follow-up of depression and anxiety and ADD.  seen November 12, 2019.  Melted down in 2020.  Went to [employer] in July.  No withdrawal.  1 drink since then.  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 She was on no stimulant and no SSRI but was taking Strattera and Wellbutrin.  The following changes were made. Stop Strattera. OK restart stimulant bc severe ADD Restart Adderall 1 daily for a few days and if tolerated then restart 1 twice daily. If not tolerated reduce the dosage if needed. May need to stop Wellbutrin if not tolerating the stimulant.  Yes.  DC Wellbutrin Restart Prozac 20 mg daily.  February 2021 appointment with the following noted: Completed grant proposal.  Couldn't doit without Adderall.  Sold a bunch of work.   Adderall XR lasts about 3 pm.  Strength seems about right.  BP been OK.  Not jittery.   Stopped Wellbutrin but had no SE. Mood drastically better with grant proposal and back on fluoxetine.  Less depressed and lethargic.  No anxiety.  Cut back on coffee. Started back with devotions and stronger faith. Plan: Continue Prozac 20 mg daily. May have to increase the dose at some point in the future given that she usually was taking higher dosages but she is getting good response at this time. Restart Wellbutrin off label for ADD since can't get 2 ADDERALL daily. 150 mg daily then 300 mg daily. She can adjust the dose between 150 mg and 300 mg daily to get the optimal effect.   05/11/2020 appointment with the following noted: Has been inconsistent with Prozac and Wellbutrin. Not sure of the effect of Wellbutrin. Biggest deterrent in work is  anxiety.  Some of the work is conceptual and difficult at times.  Can feel she's not up to a project at times.  Overall is OK but would like a steadier benefit from stimulant.  Exhausted from managing concentration and keeping up with things from the day.  Loses things.  Not good keeping up with schedule. Overall productive and emotionally OK. Can feel Adderall wear off. Mood is better in summer and worse in the winter.   F died in 2023-10-04 and that is a loss. No SE Wellbutrin. Still attends AA meetings.  Real benefit from [place] last year. Recognizes effect of anemia on ADD and mood.  Had iron infusions last winter. Plan:  Wellbutrin off label for ADD since can't get 2 ADDERALL daily. 150 mg daily then 300 mg daily.  01/24/2021 appointment with following noted: Doing a program called [employer] mindfulness app since Xmas.  CBT app helped the depression.  App helped her focus better.  Lost sign weight. Writing a lot. Before Xmas felt depressed and started negative thinking worse, self denigrating. Not drinking. More isolated.   Recognizes mo is narcissist.    Didn't tell anyone she was born until 3 mos later.  M aloof and uninterested in pt.  Lied about her birthday.  Mo lack of affection even with pt's kids. Going to [employer] for a year and it helped her to quit drinking. Also misses  kids being gone with a hole also. Plan: No med changes  05/04/2021 appointment with the following noted: Therapist Paula Pile thinks she's manic. Lost weight to 144#.   States she is still sleeping okay.  Admits she is hyper and recognizes that she is likely manic.  She feels great, euphoric with an increased sense of spiritual connectedness to God.  She has racing thoughts and talks fast and talks a lot and this is noted by her husband.  He thinks she is a bit hyper.  She has been able to maintain sobriety although she will have 1 glass of wine on special occasions but does not drink by herself.  She is not  drinking to excess.  She denies any dangerous impulsivity.  She is clearly not depressed and not particularly anxious.  She has no concerns about her medication and she has been compliant.  06/16/21 appt noted: So much better.  Going through a lot but the manic thing happened on top of it.  So much slower.  Didn't feel like losing anything with risperidone.  Likes the Adderalll at 10 mg. Some drowsiness in the AM and very drowsy from risperidone 2 mg HS. Prayer life is better. Handling stress better. Less depressed with risperidone. Still likes trazodone. Sleeps well. Plan: Reduce Prozac to 10 mg daily.  Consider stopping it because it can feel the mania however she is reluctant to do that because she fears relapse of depression. Reduce risperidone to 1.5 mg nightly due to side effects.  Discussed risk of worsening mania.  07/25/2021 appointment with the following noted: Misses the Adderall and hard to function without it. Depressed now. Heavy chest.  Anxious and guilty.  Body feels heavy.   Hates Wellbutrin.   Plan: Increase fluoxetine to 20 mg daily Add Abilify 1/2 of 15 mg tablet daily Wean wellbutrin by 1 tablet each week  bc she feels it is not helpful and DT polypharmacy Reduce risperidone to 1 daily for 1 week and stop it. Disc risk of mania. Increase Adderall to XR 20 mg AM  08/08/21 Much less depressed and starting to feel normal I feel a lot better. No SE.  Speech normal off risperidone. Sleeping OK on trazaodone and enough.   Noticed benefit from Adderall again. Plan: continue fluoxetine to 20 mg daily Continue Abilify 1/2 of 15 mg tablet daily for depression and mania continue Adderall to XR 20 mg AM  10/10/2021 phone call: Pt stated she feels like the Abilify should be decreased to 5mg.She said she is depressed but rational and not suicidal.She has an appt Monday and can wait until then if you prefer. MD response: Reduce the Abilify to 7.5 mg every other day.  We will meet on  10/16/2021 and decide what to do from there.  10/16/2021 appointment with the following noted: More depressed.  Most depressed I've ever been.  Just numb.  Sense of grief.   Thinks the manic episode was unlike anything else she ever had.  Doesn't want to medicate against it.  Don't enjoy people.  Easily overwhelmed.  Had some death thoughts but not suicidal.  Has been functional.  Feels better today after reducing Abilify to every other day but she is only been doing that for 3 days. A/P: Episode of post manic depression was explained. continue fluoxetine to 20 mg daily Hold Abilify for 1 week then resume Abilify 1/2 of 15 mg tablet every other day for depression and mania continue Adderall to XR 20 mg   AM  10/27/2021 appointment with the following noted: I'm doing so much better.  Handling the depression better. Better self talk and spiritual focus has helped.   Dep 6/10 manifesting as anxiety with low confidence.   F died 2  years ago and M 66 yo and is dependent . She is working hard to feel better but still feels depressed.  She almost feels like she has a little more anxiety since restarting Abilify every other day. Plan: continue fluoxetine to 20 mg daily DC Abilify .  Vrayalar 1.5 mg QOD to try to get rid of depression ASAP. continue Adderall to XR 20 mg AM  11/10/2021 appointment with the following noted: Busy with Xmas and it was fun with family but then a big let down.  Did well with it.  Functioned well with it.  Working hard on things with depression.  Not shutting down. Not sure but feels better today but yesterday was hard.  Difficulty dealing with mother.  She won't do anything to help herself.  Yesterday with her all day.  Won't do PT and has isolated herself.    Lack of confidence.   No SE with Vraylar.  11/24/21 urgent appointment appt noted: More and more depressed.   So anxious and doesn't want to be alone but can do so. No appetite. Hurts inside. Has had some fleeting  suicidal thoughts but would not act on them.  Tolerating meds. Has been consistent with Vraylar 1.5 mg every other day, fluoxetine 20 mg daily Plan: Increase Vraylar to 1.5 mg daily Change Prozac to Trintellix 10 mg daily. Discussed side effects of each continue Adderall to XR 20 mg AM  12/27/2021 appointment with the following noted: Not OK.  I feel less depressed but feels bat shit. Not sleeping well.  Extremely anxious. Off and on sleep. 3-4 hours of sleep.   Still having daily SI.  But also become obvious has so much to do.  Overwhelmed by tasks.   Needs anxiety meds to just function. Not more motivated.  Walked yesterday.   Feels afraid like in trouble but not irritable or angry. DC DT agitation Vraylar to 1.5 mg daily Change Prozac to Trintellix 10 mg daily. Hold Adderall to XR 20 mg AM Clonidine 0.1 1/2 tablet twice daily for 2 days and if needed for anxiety and sleep increase to 1 twice daily Ok temporary Ativan 1 mg 3 times daily as needed anxiety  01/05/22 appt noted: Off fluoxetine and  Trintellix.  Only on Ativan, trazodone and Adderall XR 20 plus added clonidine 0.1 mg BID Didn't think she needed to start Trintellix. Not taking Ativan.   Didn't like herself last week. Feels some better today. Wonders if the manic sx Not agitated.  Anxiety kind of calmed down.  A lot to be anxious about situationally.  $ stress. Concerns about downers with meds. Can't access normal personality. ? Lethargy and inability to talk as sE. Plan: Latuda 20-40 mg daily with food. Adderall to XR 20 mg AM Clonidine 0.1 1/2 tablet twice daily  reduce dose to be sure no SE Ok temporary Ativan 1 mg 3 times daily as needed anxiety  01/19/22 appt noted: Taking Latuda 20 mg daily.  Took 40 mg once and felt anxious and  SI Still depressed and not very reactive Anxiety mainly about the depression and fears of the future. She wants to revisit manic sx and thinks it was maybe bc taking delta 8 bc was  taking a lot of   it so still doesn't think she's classic bipolar. She wants to only take Prozac bc thinks Latuda is perpetuating depression. Says the delta 8 was very psychaedelic.  When not taking it was not manic.  Sleeping ok again.  Plan: Per her request DC Latuda 20-40 mg daily with food. She wants to continue Prozac alone AMA  Adderall to XR 20 mg AM Clonidine 0.1 1/2 tablet twice daily  reduce dose to be sure no SE Ok temporary Ativan 1 mg 3 times daily as needed anxiety  01/23/2022 phone call complaining of increased anxiety since stopping Latuda.  She will try increasing clonidine.  01/26/2022 phone call not feeling well and wanted to restart the Vraylar.  However notes indicate that had made her agitated therefore she was encouraged to pick up samples of Rexulti 1 mg and start that instead.  02/06/2022 phone call: Stating she felt the Rexulti was helping with depression but she was not sleeping well and obsessing over things.  She was encouraged to increase Rexulti to 2 mg daily and increase trazodone for sleep.  02/09/2022 appointment with the following noted: This was an urgent work in appointment No sleep last night with trazodone 100 mg HS Nothing really better depression or anxiety. Ruminating negative anxious thoughts. Did not tolerate Rexulti because it was causing insomnia.  Does not think it helped depression.  Lacks emotion that she should have.  Lacks her usual personality.  Some hopeless thoughts.  Some death thoughts.  Some suicidal thoughts without plan or intent Plan: DC Rexulti and Prozac & DC trazodone Adderall to XR 20 mg AM Clonidine 0.1 1/tablet twice daily  reduce dose to be sure no SE Ok temporary Ativan 1 mg 3 times daily as needed anxiety Start Seroquel XR 150 mg nightly  03/02/2022 appointment: Laura Chang called back a few days after starting Seroquel stating it was making her more anxious and more depressed.  This seemed unlikely as this medicine rarely ever  causes anxiety.  She stopped the medication waited 3 days and called back still had anxiety and depression but thought perhaps the anxiety was a little better.  She did not want to take the Seroquel. She knew about the option of Spravato and wanted to pursue that. Now questions whether to return to Seroquel while waiting to start Spravato bc feels just as bad without it and knows she didn't give it enough time to work.   MADRS 46  ECT-MADRS    Flowsheet Row Clinical Support from 08/06/2022 in [facility] Clinical Support from 07/04/2022 in [facility] Clinical Support from 05/21/2022 in [facility] Office Visit from 03/02/2022 in [facility]  MADRS Total Score 29 15 27 46      03/14/22 appt noted: Pt received Spravato 56 mg first dose today with some dissociative sx which were not severe.  She was anxious prior to the administration and felt better after receiving lorazepam 1 mg.  No NV, or HA. Wants to continue Spravato. Ongoing depression and desperate to feel better.  I'm not myself DT deprsssion which is most severe in recent history.  Anhedonia.  Low motivation.  Social avoidance. Continues to think all recent med trials are making her worse.  Sleep ok with Seroquel.  03/16/22 appt noted: Received Spravato 84 mg for the first time.  some dissociative sx which were not severe.  She was anxious prior to the administration and felt better after receiving lorazepam 1 mg.  No NV, or HA. Wants   to continue Spravato.   Does not feel any better or different since the last appt.  Ongoing depression.  Ongoing depression and desperate to feel better.  I'm not myself DT deprsssion which is most severe in recent history.  Anhedonia.  Low motivation.  Social avoidance. Continues to think all recent med trials are making her worse.  Sleep ok with Seroquel.  Does not want to continue Seroquel for TRD.  03/20/2022 appointment noted: Came for  Spravato administration today.  However blood pressure was significantly elevated approximately 180/115.  She was given lorazepam 1 mg and clonidine 0.2 mg to try to get it down. She states she regretted stopping the Seroquel XR 300 mg tablets.  She now realizes it was helpful.  She did not sleep much at all last night.  She did not take the Adderall this morning. 2 to 3 hours after arrival blood pressure was still elevated at  170/110, 62 pulse.  For Spravato administration was canceled for today.  She admits to being anxious and depressed.  She is not suicidal.  She is highly motivated to receive the Spravato.  We discussed getting it tomorrow.  03/22/2022 appointment noted: Patient's blood pressure was never stable enough yesterday in order to get her in for Spravato administration.  She was encouraged to see her primary care doctor.  It is better today.  03/26/2022 appointment with the following noted: Blood pressure was better.  Saw her primary care doctor who started on oral Bystolic 2.5 mg daily. Received Spravato 84 mg today as scheduled.  Tolerated it well without nausea or vomiting headache or chest pain or palpitations.  Her blood pressure was borderline but manageable. She remains depressed and anxious.  She is ambivalent about the medicine and desperate to get to feel better.  Continues to have anhedonia and low energy and low motivation and reduced ability to do things.  Less social.  Not suicidal.  03/28/22 appt noted: Received Spravato 84 mg today as scheduled.  Tolerated it well without nausea or vomiting headache or chest pain or palpitations.  Her blood pressure was borderline but manageable. Has not seen any improvement so far.  Tolerating Seroquel.  Inconsistent with Bystolic and BP has been borderline high. Still depressed and anxious and anhedonia.  Low motivation, energy, productivity. Taking quetiapine and tolerating XR 300 mg nightly.  04/04/22 appt noted: Received Spravato  84 mg today as scheduled.  Tolerated it well without nausea or vomiting headache or chest pain or palpitations.  Her blood pressure was borderline but manageable. Has not seen any improvement so far.  Tolerating Seroquel.   She still tends to think that the medications are making her worse.  She has said this about each of the recent psychiatric medicines including Seroquel.  However her husband thinks she is improved.  She also admits there is some improvement in productivity.  She still feels highly anxious.  She still does not enjoy things as normal.  She still feels desperate to improve as soon as possible. Has been taking Seroquel XR since 03/20/2022  04/10/22 appt noted: Received Spravato 84 mg today as scheduled.  Tolerated it well without nausea or vomiting headache or chest pain or palpitations.  Her blood pressure was borderline but manageable. Has not seen any improvement so far.  Tolerating Seroquel.  Doesn't like Seroquel bc she thinks it flattens here. Ongoing depression without confidence Plan: Start Auvelity 1 every morning for persistent treatment resistant depression  04/12/2022 appointment with the following noted:  Received Spravato 84 mg today as scheduled.  Tolerated it well without nausea or vomiting headache or chest pain or palpitations.  Her blood pressure was borderline but manageable. Has not seen any improvement so far.  Tolerating Seroquel.  Doesn't like Seroquel bc she thinks it flattens her. Received Spravato 84 mg today as scheduled.  Tolerated it well without nausea or vomiting headache or chest pain or palpitations.  Her blood pressure was borderline but manageable. Has not seen any improvement so far.  Tolerating Seroquel.  Doesn't like Seroquel bc she thinks it flattens here.  We discussed her ambivalence about it. She is starting Auvelity and has tolerated it the last 2 days without side effect.  She still does not feel like herself and feels flat and not enjoying  things with suppressed expressed emotion  04/17/2022 appointment with the following noted: Received Spravato 84 mg today as scheduled.  Tolerated it well without nausea or vomiting headache or chest pain or palpitations.  Her blood pressure was borderline but manageable. Has not seen any improvement so far.  Tolerating Seroquel.  Doesn't like Seroquel bc she thinks it flattens her. She has been tolerating the Auvelity 1 in the morning without side effects for about a week.  She has not noticed significant improvement so far.  She still feels depressed and flat and not herself.  Other people notice that she is flat emotionally.  She is not suicidal.  She does feel discouraged that she is not getting better yet.  04/19/2022 appointment noted: Has increased Auvelity to 1 twice daily for 2 days, continues quetiapine XR 300 mg nightly, clonidine 0.3 mg twice daily, lorazepam 1 mg twice daily for anxiety and Adderall XR 20 mg in the morning. No obious SE but she still thinks quetiapine XR is making her feel down.  But not sedated Received Spravato 84 mg today as scheduled.  Tolerated it well without nausea or vomiting headache or chest pain or palpitations.  Her blood pressure was borderline but manageable. She still feels quite anxious and feels it necessary to take both the clonidine and lorazepam twice a day to manage her anxiety.  She has been consistently down and flat and not herself until yesterday afternoon she noted an improvement in mood and feeling much more like herself with her normal personality reemerging.  She was quite depressed in the morning with very dark negative thoughts.  She did not have those dark negative thoughts this morning.  She had a lot of questions about medication and when she was expecting to be improved and why she has not shown improvement up to now.  04/23/22 appt noted: Has increased Auvelity to 1 twice daily for 1 week, continues quetiapine XR 300 mg nightly, clonidine 0.3  mg twice daily, lorazepam 1 mg twice daily for anxiety and Adderall XR 20 mg in the morning. No obious SE but she still thinks quetiapine XR is making her feel down.  But not sedated Received Spravato 84 mg today as scheduled.  Tolerated it well without nausea or vomiting headache or chest pain or palpitations.  She is still depressed but admits better function and is able to enjoy social interactions. Tolerating meds.  Would like to feel better for sure. Not herself.  Flat. Plan increase Auvelity to 1 tab BID as planned and reduce Quetiapine to 1/2 of ER 300 mg  bc NR for depression.  04/25/2022 appointment with the following noted: clonidine 0.3 mg twice daily, lorazepam 1 mg twice daily   for anxiety and Adderall XR 20 mg in the morning. Seroquel XR 300 HS No obious SE but she still thinks quetiapine XR is making her feel down.  But not sedated Received Spravato 84 mg today as scheduled.  Tolerated it well without nausea or vomiting headache or chest pain or palpitations.  Called yesterday with more anxiety.  Had increased Auvelity for 1 day and reduced Seroquel XR for 1 day.  Felt restless and fearful  05/01/2022 appointment noted: clonidine 0.3 mg twice daily, lorazepam 1 mg twice daily for anxiety and Adderall XR 20 mg in the morning. Seroquel XR 150 HS, Auvelity 1 BID Received Spravato 84 mg today as scheduled.  Tolerated it well without nausea or vomiting headache or chest pain or palpitations.  Nurse has noted patient has called multiple times sometimes asking the same question repeatedly.  It is unclear whether she is truly forgetful or is just anxious seeking reassurance. Patient acknowledges ongoing depression as well as some anxiety but states she has felt a little better in the last couple of days.  She has reduced the Seroquel to 150 mg at night and has increased Auvelity to 1 twice daily but only for 1 day.  So far she seems to be tolerating it.  05/03/22 appt noted: clonidine 0.2 mg  twice daily, lorazepam 1 mg twice daily for anxiety and Adderall XR 20 mg in the morning. Seroquel XR 150 HS, Auvelity 1 BID BP high this am about 170/100 and received extra clonidine 0.2 mg and came to receive Spravato.  Not dizzy, no SOB, nor CP but BP is still high Could not receive Spravato today bc BP high and pulse low at 30 ppm. Still depressed and anxious. Plan: continue trial Auvelity with Spravato She needs to get BP and pulse managed  05/08/22 TC: RTC  H Michael NA and mailbox full.  Could not leave message.  Pt  -  talked to she and H on speaker. H worried over wife.  Vacant stare.  Slurs words at times.  Not smiling. Reduced enjoyment.  Depression.  Withdrawn from usual activities.  Some irritability.  Anxious. Disc her concerns meds are making her worse.  Extensive discussion about her treatment resistant status.  There is a consistent pattern of not taking the medicines long enough to get benefit because she believes the meds are making her worse.  However the symptoms she describes as side effects are exactly the same symptoms that she had prior to taking the medication RX for  the depression.  So it is not clear that these are actual side effects. This is true about the 2 most recent meds including Seroquel and Auvelity.  Recommend psychiatric consultation in hopes of improving her comfort level with taking prescribed medications for a sufficient length of time to provide benefit. Extensive discussion about ECT is the treatment of choice for treatment resistant depression.  Spravato may work if she can comply with consistency.  There are medication options but they take longer to work.   Plan:  Reduce clonidine to 0.1 mg BID DT bradycardia.  Talk with PCP about BP and low pulse problems which are interfering with her consistent compliance with Spravato.   Limit lorazepam to 3 -4  mg daily max. Excess use is the cause of slurring speech.  She must stop excess use or will have to stop  the med. Stop Auvelity per her request.  But she has only been on the full dose for a little over  a week and clearly has not had time to get benefit from it.  She thinks maybe it is making her more anxious. Reduce Seroquel from 150XR to 50 -100 mg at night IR.  She couldn't sleep when stopped it completely. Will not start new antidepressant until her SE issues are resolved or not. Get second psych opinion from Chris Aiken MD or another psychiatrist.  H's sister is therapist in [place], MD, DFAPA  05/16/2022 appointment with the following noted: Received Spravato 84 mg today as scheduled.  Tolerated it well without nausea or vomiting headache or chest pain or palpitations.  She stopped Auvelity as discussed last week. On her own, without physician input, she restarted Wellbutrin XL 450 mg every morning today.  She had taken it in the past.  She feels jittery and anxious. She feels less depressed than she did last week.  But she is still depressed without her usual range of affect.  She still is less social and less motivated than normal. Her primary care doctor increased the dose of losartan Plan: Stop Seroquel Reduce Wellbutrin XL to 300 mg every morning.  Starting the dose at 450 every morning is likely causing side effects of jitteriness and it should not be started at that have a dosage. Recommend she not change meds on her own without MDM put  05/23/2022 appointment with the following noted: Received Spravato 84 mg today as scheduled.  Tolerated it well without nausea or vomiting headache or chest pain or palpitations.  Has not dropped seroquel XR 300 mg 1/2 tablet nightly bc couldn't sleep without it. Has not tried lower dose quetiapine 50 mg HS Still feels depressed.   BP is better managed so far, just saw PCP.  BP is better today and infact is low today. Dropped clonidine as directed from 0.3 mg BID bc inadequate control of BP to 0.2 mg BID.  However she wants to increase it  back to 0.3 mg twice daily because she feels it helped her anxiety better.  Wonders about increasing Wellbutrin for depression.  However she has only been on 300 mg a day for a week.  She was on 450 mg daily in the past.  06/06/22 appt noted: Received Spravato 84 mg today as scheduled.  Tolerated it well without nausea or vomiting headache or chest pain or palpitations.  She is still depressed and anxious.  She wants to try to stop the Seroquel but cannot sleep without some of it.  She is taking lorazepam 1 mg 4 times daily and still having a lot of anxiety.  She wants to increase clonidine back to 0.3 mg twice daily.  She hopes for more improvement She recently went for a second psychiatric opinion as suggested the results of that are pending.  06/11/22 appt noted: Received Spravato 84 mg today as scheduled.  Tolerated it well without nausea or vomiting headache or chest pain or palpitations.  She is still depressed and anxious. Without much change.  Still hopeless, anhedonia, reduced inteterest and motivation.  Tolerating meds. Disc concerns Spravato is not hleping much. Plan: stop Seroquel and start olanzapine 10 mg HS for TRD and anxiety.  06/13/2022 appointment noted: Received Spravato 84 mg today as scheduled.  Tolerated it well without nausea or vomiting headache or chest pain or palpitations.  She is still depressed and anxious. Without much change.  Still hopeless, anhedonia, reduced inteterest and motivation.  Tolerating meds. Disc concerns Spravato is not helping much as hoped but is   improving a bit in the last week. Tolerating meds. Continues Wellbutrin XL 450 AM, tolerating recently started olanzapine  10 mg HS. Sleep is good.   Pending appt with TMS consult.  06/18/22 appt noted: Received Spravato 84 mg today as scheduled.  Tolerated it well without nausea or vomiting headache or chest pain or palpitations.  Tolerating meds. Continues Wellbutrin XL 450 AM, tolerating recently started  olanzapine  10 mg HS. Continues Adderall XR 20 amd and has tried to reduce lorazepam to 1mg TID Sleep is good.   Pending appt with TMS consult. Depression is a little bit better in the last week with a little improvement in emotional expression and interest.  She is pushing herself to be more active.  Her daughter thought she was a little better than she has been.  However she is still depressed and still not her normal self with anhedonia and reduced emotional expressiveness.  06/20/22 appt noted: Received Spravato 84 mg today as scheduled.  Tolerated it well without nausea or vomiting headache or chest pain or palpitations.  Tolerating meds with a little sleepiness. Continues Wellbutrin XL 450 AM, tolerating recently started olanzapine  10 mg HS. Continues Adderall XR 20 amd and has tried to reduce lorazepam to 1mg TID Sleep is good.   Mood is improving.  Better funciton.  Anxiety is better with olanzapine. Still not herself and depression not gone with some anhedonia and social avoidance and feeling overwhelmed.  8/14 2023 received Spravato 84 mg 06/27/2022 received Spravato Spravato 84 mg 07/02/2022 received Spravato 84 mg 07/04/2022 received Spravato 84 mg  07/09/2022 appointment noted: Received Spravato 84 mg today as scheduled.  Tolerated it well without nausea or vomiting headache or chest pain or palpitations.  Expected dissociation and feels less depressed with resolution of negative emotions immediately after Spravato and then depression, anxiety creep back in. Continues meds Adderall XR 20 mg every morning, Wellbutrin XL 450 every morning, clonidine 0.1 mg twice daily, lorazepam 1 mg every 6 hours as needed, olanzapine increased from 7.5 to 10 mg nightly on  Tolerating meds.  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  She does note some increased appetite and is  somewhat concerned about that but has not gained significant amounts of weight. She has had the TMS consultation which was initially denied but she knows it can be appealed.  However because she is improving with Spravato plus the other medications now she wants to continue the current treatment plan.  07/18/22 appt noted: Continues meds Adderall XR 20 mg every morning, Wellbutrin XL 450 every morning, clonidine 0.1 mg twice daily, lorazepam 1 mg every 6 hours as needed, olanzapine increased from 7.5 to 10 mg nightly on 07/04/2022. Received Spravato 84 mg today as scheduled.  Tolerated it well without nausea or vomiting headache or chest pain or palpitations.  Expected dissociation and feels less depressed with resolution of negative emotions immediately after Spravato and then depression, anxiety creep back in. Continues meds Adderall XR 20 mg every morning, Wellbutrin XL 450 every morning, clonidine 0.1 mg twice daily, lorazepam 1 mg every 6 hours as needed, olanzapine increased from 7.5 to 10 mg nightly on  Tolerating meds.  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  She does note some increased appetite and   is somewhat concerned about that but has not gained significant amounts of weight. She has had the [place] consultation which was initially denied but she knows it can be appealed. She continues to have chronic ambivalence about psychiatric medicines and initially tends to blame her depressive symptoms such as decreased concentration and feeling flat on what ever medicine she currently is taking even though she had the same symptoms before the current medicines were started.  Then after discussion she does admit that her depressive symptoms are improved since adding olanzapine but still has those residual symptoms noted.  07/23/22 received Spravato 84 mg   07/30/2022 appointment  noted: Received Spravato 84 mg today as scheduled.  Tolerated it well without nausea or vomiting headache or chest pain or palpitations.  Expected dissociation and feels less depressed with resolution of negative emotions immediately after Spravato and then depression, anxiety creep back in. Continues meds Adderall XR 20 mg every morning, Wellbutrin XL 450 every morning, clonidine 0.1 mg twice daily, lorazepam 1 mg every 6 hours as needed, olanzapine increased from 7.5 to 10 mg nightly on  She has been inconsistent with olanzapine because she continues to be ambivalent about the medications in general and thinks that perhaps the 10 mg is making her feel blunted.  She continues to feel some depression.  She had a good day this week and but still feels somewhat depressed and persistently anxious. Plan: be consistent with olanzapine 10 mg HS for TRD and longer trial for potential benefit for anxiety.  Has not taken it consistently.  08/06/2022 appointment noted: Received Spravato 84 mg today as scheduled.  Tolerated it well without nausea or vomiting headache or chest pain or palpitations.  Expected dissociation and feels less depressed with resolution of negative emotions immediately after Spravato and then depression, anxiety creep back in. Continues meds Adderall XR 20 mg every morning, Wellbutrin XL 450 every morning, clonidine 0.1 mg twice daily, lorazepam 1 mg every 6 hours as needed, olanzapine i 10 mg nightly  She continues to feel depressed but is about 50% better with Spravato.  She is still not herself.  She still has anhedonia.  She still is not her able to engage socially in the typical ways.  She is not jovial and outgoing like normal.  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  She continues to feel that her personality is dampened down.  There is a question about whether it is  related to depression or medication. Plan: continue olanzapine 10 for longer trial for TRD and severe anxiety.  08/13/22 appt noted:  Received Spravato 84 mg today as scheduled.  Tolerated it well without nausea or vomiting headache or chest pain or palpitations.  Expected dissociation and feels less depressed with resolution of negative emotions immediately after Spravato and then depression, anxiety creep back in. Continues meds Adderall XR 20 mg every morning, Wellbutrin XL 450 every morning, clonidine 0.1 mg twice daily, lorazepam 1 mg every 6 hours as needed, olanzapine i 10 mg nightly  She still does not feel herself.  Still struggles with depression and low motivation and reduced social engagement and reduced interest and reduced emotional expression.  She is somewhat better with the medicines plus Spravato.  She still believes the Spravato makes her blunted and is not sure how much it helps her anxiety.  She can have good days when  her family is around and she is engaged.  She still wants to stop the olanzapine. She has apparently continued to take the trazodone despite having been told to stop it when she started olanzapine.  She feels like she needs the trazodone. Plan: DC olanzapine and Start nortriptyline 25 mg nightly and build up to 75 mg nightly and then check blood level.    08/27/2022 appointment noted: Received Spravato 84 mg today as scheduled.  Tolerated it well without nausea or vomiting headache or chest pain or palpitations.  Expected dissociation and feels less depressed with resolution of negative emotions immediately after Spravato and then depression, anxiety creep back in. Continues meds Adderall XR 20 mg every morning, Wellbutrin XL 450 every morning, clonidine 0.1 mg twice daily, lorazepam 1 mg every 6 hours as needed. Stopped olanzapine and started nortriptyline which she has taken for about a week is 75 mg nightly. So far she is tolerating the nortriptyline well with the  exception of some dry mouth and constipation which she is working to manage.  She does not feel substantially better better or different off the olanzapine.  No change in her sleep which is good.  Main concern currently in addition to the residual depression is anxiety which is somewhat situational with pending arch show.  She is worrying about it more than normal.  Says she is having to take lorazepam twice a day where she had been able to keep reduce it prior to this.  She still does not feel like herself with residual depression with less social interest and less of her usual buoyancy in personality.  She is flatter than normal.  Overall she still feels that the Spravato has been helpful at reducing the severity of the depression.  She is not suicidal. She has not heard anything about the TMS appeal as of yet.  09/05/2022 appointment noted: Received Spravato 84 mg today as scheduled.  Tolerated it well without nausea or vomiting headache or chest pain or palpitations.  Expected dissociation and feels less depressed with resolution of negative emotions immediately after Spravato and then depression, anxiety creep back in. Continues meds Adderall XR 20 mg every morning, Wellbutrin XL 450 every morning, clonidine 0.1 mg twice daily, lorazepam 1 mg every 6 hours as needed. Stopped olanzapine and started nortriptyline which she has taken for about 2 week is 75 mg nightly. Initially blood pressure was a little high causing delay in starting Spravato.  She admitted to feeling a little wound up.  She still experiences a little increase in depression if she goes longer than a week in between doses of Spravato.  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  09/10/22 appt noted: Received Spravato 84 mg today as scheduled.  Tolerated it well without nausea or vomiting headache or chest pain or palpitations.   Expected dissociation gradually resolved over the 2 hour observation period. She feels 50% less depressed with Spravto and wants to continue it.   Continues meds Adderall XR 20 mg every morning, Wellbutrin XL 450 every morning, clonidine 0.1 mg twice daily, lorazepam 1 mg every 6 hours as needed. Has started nortriptyline 75 mg nightly for about 3 weeks. Has not seen a significant difference with the addition of nortriptyline.  Tolerating it pretty well. She continues to have some degree of anhedonia and significant depression and anxiety.  Her daughters noticed that   she is more needy and calls more frequently.  She acknowledges this as well.  She is clearly still not herself. Plan: pramipexole off label and RX 0.25 mg BID  09/17/2022 appointment noted: Received Spravato 84 mg today as scheduled.  Tolerated it well without nausea or vomiting headache or chest pain or palpitations.  Expected dissociation gradually resolved over the 2 hour observation period. She feels 50% less depressed with Spravto and wants to continue it.   Continues meds Adderall XR 20 mg every morning, Wellbutrin XL 450 every morning, clonidine 0.1 mg twice daily, lorazepam 1 mg every 6 hours as needed. Has started nortriptyline 75 mg nightly for about 3 weeks and DT level 176, reduced to 50 mg HS early November. Still the same sx as noted last visit.  Tolerating meds.   Compliant.  Still depressed and family notices.  Has been able to participate in family interactions.  Some post-show let down and has to do detailed work which is hard for her bc ADD.  Sleep and eating well.  Energy OK but not great.  No SI.  Not cried in a year or so.  Clearly less depressed and hopeless than before the Spravato.  09/24/22 appt noted: Received Spravato 84 mg today as scheduled.  Tolerated it well without nausea or vomiting headache or chest pain or palpitations.  Expected dissociation gradually resolved over the 2 hour observation period. She  feels 50% less depressed with Spravto and wants to continue it.   Continues meds Adderall XR 20 mg every morning, Wellbutrin XL 450 every morning, clonidine 0.1 mg twice daily, lorazepam 1 mg every 6 hours as needed. Has started nortriptyline 75 mg nightly for about 3 weeks and DT level 176, reduced to 50 mg HS early November. Still the same sx as noted last visit.  Tolerating meds.   Compliant.  Still depressed and family notices.  Has been able to participate in family interactions.  Some post-show let down and has to do detailed work which is hard for her bc ADD.  Sleep and eating well.  Energy OK but not great.  No SI.  Not cried in a year or so.  Clearly less depressed and hopeless than before the Spravato. Is not making further progress generally.  Stuck with moderate depression  ECT-MADRS    Flowsheet Row Clinical Support from 08/06/2022 in [place] from 07/04/2022 in [place] from 05/21/2022 in [place] Visit from 03/02/2022 in [facility]  MADRS Total Score _0 46        Past Psychiatric Medication Trials: fluoxetine, duloxetine, Viibryd, lamotrigine, Pristiq, sertraline, citalopram,  Trintellix anxious and SI Wellbutrin XL 450 [place] 1 dose  Adderall, Adderall XR, Vyvanse, Ritalin, Strattera low dose NR Lorazepam Trazodone  Depakote,  lamotrigine cog complaints Lithium remotely Abilify 7.5  Vraylar 1.5 mg daily agitation and insomnia Rexulti insomnia Latuda 40 one dose, CO anxious and SI Seroquel XR 300 Olanzapine 10  At visit November 12, 2019. We discussed Patient developed an increasingly severe alcohol dependence problem since her last visit in January.  She went to [facility] and has had no alcohol since then except 1 day.  She never abused stimulants but they took her off the stimulants at [facility].  Her ADD was markedly worse.  The  Wellbutrin did not help the ADD.   D history lamotrigine rash at 66 yo  Review of Systems:  Review of Systems  Constitutional:  Positive for fatigue.  Musculoskeletal:  Positive for arthralgias and back pain. Negative for joint swelling.       SP hip surgery October 2020  Neurological:  Negative for dizziness and tremors.  Psychiatric/Behavioral:  Positive for decreased concentration and dysphoric mood. Negative for agitation, behavioral problems, confusion, hallucinations, self-injury, sleep disturbance and suicidal ideas. The patient is nervous/anxious. The patient is not hyperactive.     Medications: I have reviewed the patient's current medications.  Current Outpatient Medications  Medication Sig Dispense Refill   amLODipine (NORVASC) 2.5 MG tablet Take 2.5 mg by mouth daily.     amphetamine-dextroamphetamine (ADDERALL XR) 20 MG 24 hr capsule Take 1 capsule (20 mg total) by mouth every morning. 30 capsule 0   amphetamine-dextroamphetamine (ADDERALL XR) 20 MG 24 hr capsule Take 1 capsule (20 mg total) by mouth every morning. 30 capsule 0   buPROPion (WELLBUTRIN XL) 150 MG 24 hr tablet TAKE 3 TABLETS BY MOUTH DAILY 270 tablet 1   cloNIDine (CATAPRES) 0.2 MG tablet Take 1 tablet (0.2 mg total) by mouth 2 (two) times daily. 60 tablet 1   iron polysaccharides (NIFEREX) 150 MG capsule TAKE 1 CAPSULE BY MOUTH EVERY DAY 90 capsule 1   LORazepam (ATIVAN) 1 MG tablet Take 1 tablet (1 mg total) by mouth every 8 (eight) hours as needed. for anxiety 90 tablet 0   losartan (COZAAR) 50 MG tablet Take 50 mg by mouth daily.     nebivolol (BYSTOLIC) 2.5 MG tablet Take 2.5 mg by mouth daily.     nortriptyline (PAMELOR) 25 MG capsule Take 3 capsules (75 mg total) by mouth at bedtime. (Patient taking differently: Take 50 mg by mouth at bedtime.) 90 capsule 0   pramipexole (MIRAPEX) 0.25 MG tablet Take 1.5 tablets (0.375 mg total) by mouth 2 (two) times daily. 90 tablet 0   SPRAVATO, 84 MG DOSE, 28  MG/DEVICE SOPK USE 3 SPRAYS IN EACH NOSTRIL TWICE A WEEK 3 each 3   traZODone (DESYREL) 50 MG tablet 1-2 tablets nightly as needed for sleep 60 tablet 1   Dextromethorphan HBr 15 MG CAPS Take 1 capsule (15 mg total) by mouth every 12 (twelve) hours. (Patient not taking: Reported on 09/26/2022) 60 capsule 0   No current facility-administered medications for this visit.    Medication Side Effects: None  Allergies:  Allergies  Allergen Reactions   Metronidazole Shortness Of Breath and Other (See Comments)    Heart pounding   Ferrlecit [Na Ferric Gluc Cplx In Sucrose] Other (See Comments)    Infusion reaction 05/12/2019    Past Medical History:  Diagnosis Date   ADHD    Anemia    Anxiety    Arthritis    Depression    Heart murmur    i went to see a cardiologit slast eyar  and i had zero plaque,    PONV (postoperative nausea and vomiting)    Recovering alcoholic in remission ([place])     Family History  Problem Relation Age of Onset   Atrial fibrillation Mother    CAD Father     Past Medical History, Surgical history, Social history, and Family history were reviewed and updated as appropriate.   Please see review of systems for further details on the patient's review from today.   Objective:   Physical Exam:  There were no vitals taken for this visit.  Physical Exam Constitutional:      General: She is not in acute distress. Neurological:  Mental Status: She is alert and oriented to person, place, and time.     Coordination: Coordination normal.     Gait: Gait normal.  Psychiatric:        Attention and Perception: Attention and perception normal.        Mood and Affect: Mood is anxious and depressed. Affect is not labile, angry or tearful.        Speech: Speech is not rapid and pressured or slurred.        Behavior: Behavior is not slowed.        Thought Content: Thought content is not paranoid or delusional. Thought content does not include homicidal or suicidal  ideation. Thought content does not include suicidal plan.        Cognition and Memory: Cognition normal. Memory is not impaired. She does not exhibit impaired recent memory.        Judgment: Judgment normal.     Comments: Insight intact. No auditory or visual hallucinations. No delusions.  Depression ongoing residual. Affect still depressed but less blunted than it was before Spravato.  Still blunted No Sui intent plan      Lab Review:     Component Value Date/Time   NA 137 01/12/2021 1430   NA 140 11/18/2018 1544   K 3.8 01/12/2021 1430   CL 108 01/12/2021 1430   CO2 22 01/12/2021 1430   GLUCOSE 94 01/12/2021 1430   BUN 14 01/12/2021 1430   BUN 20 11/18/2018 1544   CREATININE 0.82 01/12/2021 1430   CALCIUM 8.9 01/12/2021 1430   PROT 6.6 01/12/2021 1430   ALBUMIN 3.9 01/12/2021 1430   AST 12 (L) 01/12/2021 1430   ALT 11 01/12/2021 1430   ALKPHOS 46 01/12/2021 1430   BILITOT 0.5 01/12/2021 1430   GFRNONAA >60 01/12/2021 1430   GFRAA >60 09/02/2019 0249   GFRAA >60 01/27/2019 0811       Component Value Date/Time   WBC 4.5 01/12/2021 1430   RBC 4.32 01/12/2021 1430   HGB 12.8 01/12/2021 1430   HGB 12.9 07/17/2019 0953   HCT 38.5 01/12/2021 1430   HCT 21.9 (L) 12/25/2018 1221   PLT 272 01/12/2021 1430   PLT 286 07/17/2019 0953   MCV 89.1 01/12/2021 1430   MCH 29.6 01/12/2021 1430   MCHC 33.2 01/12/2021 1430   RDW 12.4 01/12/2021 1430   LYMPHSABS 1.4 01/12/2021 1430   MONOABS 0.4 01/12/2021 1430   EOSABS 0.0 01/12/2021 1430   BASOSABS 0.0 01/12/2021 1430    No results found for: "POCLITH", "LITHIUM"   No results found for: "PHENYTOIN", "PHENOBARB", "VALPROATE", "CBMZ"   .res Assessment: Plan:    Recurrent major depression resistant to treatment ([place]) - Plan: Dextromethorphan HBr 15 MG CAPS  Generalized anxiety disorder  Attention deficit hyperactivity disorder (ADHD), predominantly inattentive type  Insomnia due to mental condition   She has treatment  resistant major depression at this time.  Have  discussed some of her recent abnormal behaviors leading to this depressive episode getting worse which she says were associated with heavy use of delta 8 and not a manic episode.  She realizes now that that was not good for her.  She stopped all use of other drugs including those available over-the-counter such as delta 8 or any other THC related products.  She is no longer having any of those types of behaviors and instead is depressed.  She remains depressed with lack of interest and lack of feeling for things that normally she  would have feelings about.  She has low motivation and energy.  She is difficulty making decisions.  She is sad and down.  She is less productive than usual.  Her concentration is poor.  She has high degree of anxiety as well.  Her personality is more flat than usual. She has a very negative self-image and low self-esteem.  These are all uncharacteristic for her However all of these symptoms are partially improved through Spravato and the switch from Seroquel to olanzapine.  She is approximately 50% better.  Patient was administered Spravato 84 mg intranasally today.  The patient experienced the typical dissociation which gradually resolved over the 2-hour period of observation.  There were no complications.  Specifically the patient did not have nausea or vomiting or headache.  Blood pressures remained within normal ranges at the 40-minute and 2-hour follow-up intervals.  By the time the 2-hour observation period was met the patient was alert and oriented and able to exit without assistance.  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  See nursing note for further details.She wants to continue Spravato. We discussed discussed the side effects in detail as well as the protocol required to receive Spravato.   Failed multiple antidepressants.   Many of them were not actual failures but intolerances and it is unclear whether some of that was more connected with anxiety than true side effects.  1 example is the [place].  In general she does not want to try anything but an antidepressant but has failed all major categories of antidepressants except TCAs and MAO inhibitors which have not been tried.  Objectively nor subjectively any different since stopping olanzapine.  In its place the next logical antidepressant would be to use a tricyclic antidepressant as she has not taken 1 of these medications before. Started nortriptyline 75 mg nightly.  Serum level 176.  Reduced to 50 mg HS  Discussed side effects in detail.  Needs more time to help. Extensive discussion previously about her ambivalence about meds and missatributing sx of depression as SE of meds.    continue Wellbutrin XL 450 mg AM  Off label augmentation DM at a lower dose of 15 mg BID.  Started Spravato 84 mg twice weekly on 03/16/2022.  Now on weekly administration  Adderall  XR 20 mg AM  Ok temporary Ativan 1 mg 3-4 times daily as needed anxiety but try to cut it back. Is not ideal to use benzodiazepine with stimulant but because of the severity of her symptoms it has been necessary.  Hope to eventually eliminate the benzodiazepine.  Expected as her depression improves her anxiety will improve as well.  However lately her anxiety has been unmanageable.  We will expect that to improve as the depression improves.  She has headed insturctions to reduce this.  Continue clonidine 0.2 mg BID off label for anxiety and helps BP partially. BP is better controlled but not consistent.  Consider increasing amlodipine.  Also on losartan 50  Discussed potential benefits, risks, and side effects of stimulants with patient to include increased heart rate, palpitations, insomnia, increased anxiety, increased irritability, or decreased appetite.  Instructed patient to contact office if  experiencing any significant tolerability issues. She wants to return to usual dose of Adderall for ADD bc of mor poor cognitive function with reduction.  Also discussed that depression will impair cognitive function.  [place] consultation was initially denied but she understands is being  appealed if needed.  She has not called about this and is encouraged to make this call to help determine the next steps.  Extensive discussion about her chronic ambivalence about psychiatric medicines and tendency to blame medicine for the symptoms of depression that she had prior to even starting the medications.  After discussing this at length she was able to acknowledge that that is factual.  She does not appear to be having any significant side effects with the current medications.  However so far not much benefit with nortriptyline despite adequate level.  Has Maintained sobriety  FU with Spravato weekly   Lynder Parents, MD, DFAPA     Please see After Visit Summary for patient specific instructions.  Future Appointments  Date Time Provider [place]  10/01/2022  9:30 AM Cottle, Billey Co., MD [facility] None  10/01/2022  9:30 AM CP-NURSE [facility] None  10/08/2022  9:30 AM Cottle, Billey Co., MD [facility] None  10/08/2022  9:30 AM CP-NURSE [facility] None              No orders of the defined types were placed in this encounter.      -------------------------------

## 2022-09-28 ENCOUNTER — Other Ambulatory Visit: Payer: Self-pay

## 2022-09-28 DIAGNOSIS — F900 Attention-deficit hyperactivity disorder, predominantly inattentive type: Secondary | ICD-10-CM

## 2022-09-28 MED ORDER — AMPHETAMINE-DEXTROAMPHET ER 20 MG PO CP24: 20.0000 mg | ORAL_CAPSULE | ORAL | 0 refills | Status: DC

## 2022-09-30 NOTE — Progress Notes (Signed)
NURSES NOTE:   Patient arrived for her 41st Spravato treatment. Pt is being treated for Treatment Resistant Depression, pt will be receiving 84 mg which will continue to be her maintenance dose, she receives weekly treatments.  Pt is also planning on [place] as soon as insurance will approve her treatments, then she will stop Spravato at that time. Patient arrived and taken to treatment room. Confirmed she had a ride home which is her husband would be coming back to pick up pt when done and sometimes she needs to use [facility] if he is unable to pick her up. Pt's Spravato is ordered through [employer] and delivered to office, all Spravato medication is stored at doctors office per REMS/FDA guidelines. The medication is required to be locked behind two doors per FDA/REMS Protocol. Medication is also disposed of properly per regulations.      Began taking patient's vital signs at 10:15 AM 152/100, pulse 57, Pulse Ox 98%, waited to recheck and it was still elevated at 150/100, 57 pulse. After consulting with Dr. Clovis Pu he advised to go ahead and start her treatment since her B/P usually goes down during her treatments.  Gave patient first dose 28 mg nasal spray, each nasal spray administered in each nostril as directed and waited 5 minutes between the second and third dose. All 3 doses given pt did not complain of any nausea/vomiting, given a cup of water due to the taste after the administration of Spravato.  She listens to [employer] with spa or relaxing music.  Checked 40 minute vitals at 11:40 AM, 135/86, pulse 55, Pulse Ox 94%. Explained she would be monitored for a total time of 120 minutes. Discharge vitals were taken at 12:55 PM 106/77 P 80, 97% Pulse Ox. Dr. Clovis Pu met with pt today and discussed her medication and her plans for [place]. I walked pt to elevator, where her husband met her for the ride home. Recommend she go home and sleep or just relax on the couch. No driving, no intense activities. Verbalized  understanding. Nurse was with pt a total of 70 minutes for clinical. Pt is coming back next week, Monday. Pt instructed to call office with any problems or questions.      LOT [id number] EXP AUG 2025

## 2022-10-01 ENCOUNTER — Encounter: Payer: 59 | Admitting: Psychiatry

## 2022-10-02 ENCOUNTER — Ambulatory Visit (INDEPENDENT_AMBULATORY_CARE_PROVIDER_SITE_OTHER): Payer: 59 | Admitting: Psychiatry

## 2022-10-02 ENCOUNTER — Ambulatory Visit: Payer: 59

## 2022-10-02 VITALS — BP 153/99 | HR 61

## 2022-10-02 DIAGNOSIS — F900 Attention-deficit hyperactivity disorder, predominantly inattentive type: Secondary | ICD-10-CM | POA: Diagnosis not present

## 2022-10-02 DIAGNOSIS — F339 Major depressive disorder, recurrent, unspecified: Secondary | ICD-10-CM

## 2022-10-02 DIAGNOSIS — F5105 Insomnia due to other mental disorder: Secondary | ICD-10-CM

## 2022-10-02 DIAGNOSIS — I10 Essential (primary) hypertension: Secondary | ICD-10-CM

## 2022-10-02 DIAGNOSIS — F411 Generalized anxiety disorder: Secondary | ICD-10-CM

## 2022-10-03 ENCOUNTER — Other Ambulatory Visit: Payer: Self-pay | Admitting: Psychiatry

## 2022-10-03 DIAGNOSIS — F339 Major depressive disorder, recurrent, unspecified: Secondary | ICD-10-CM

## 2022-10-07 ENCOUNTER — Other Ambulatory Visit: Payer: Self-pay | Admitting: Psychiatry

## 2022-10-07 DIAGNOSIS — F5105 Insomnia due to other mental disorder: Secondary | ICD-10-CM

## 2022-10-08 ENCOUNTER — Other Ambulatory Visit: Payer: Self-pay | Admitting: Psychiatry

## 2022-10-08 ENCOUNTER — Ambulatory Visit (INDEPENDENT_AMBULATORY_CARE_PROVIDER_SITE_OTHER): Payer: 59 | Admitting: Psychiatry

## 2022-10-08 ENCOUNTER — Ambulatory Visit: Payer: 59

## 2022-10-08 ENCOUNTER — Encounter: Payer: Self-pay | Admitting: Psychiatry

## 2022-10-08 VITALS — BP 153/102 | HR 60

## 2022-10-08 DIAGNOSIS — F900 Attention-deficit hyperactivity disorder, predominantly inattentive type: Secondary | ICD-10-CM

## 2022-10-08 DIAGNOSIS — I10 Essential (primary) hypertension: Secondary | ICD-10-CM

## 2022-10-08 DIAGNOSIS — F339 Major depressive disorder, recurrent, unspecified: Secondary | ICD-10-CM

## 2022-10-08 DIAGNOSIS — F5105 Insomnia due to other mental disorder: Secondary | ICD-10-CM

## 2022-10-08 DIAGNOSIS — F411 Generalized anxiety disorder: Secondary | ICD-10-CM

## 2022-10-08 MED ORDER — PRAMIPEXOLE DIHYDROCHLORIDE 0.5 MG PO TABS: 0.5000 mg | ORAL_TABLET | Freq: Two times a day (BID) | ORAL | 0 refills | Status: DC

## 2022-10-08 NOTE — Progress Notes (Signed)
Laura Chang [id number] 1955-11-28 66 y.o.  Subjective:   Patient ID:  Laura Chang is a 66 y.o. (DOB 1956/01/31) female.  Chief Complaint:  Chief Complaint  Patient presents with   Follow-up   Depression   Anxiety   ADD     HPI [name] presents to the office today for follow-up of depression and anxiety and ADD.  seen November 12, 2019.  Melted down in 2020.  Went to [employer] in July.  No withdrawal.  1 drink since then.  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 She was on no stimulant and no SSRI but was taking Strattera and Wellbutrin.  The following changes were made. Stop Strattera. OK restart stimulant bc severe ADD Restart Adderall 1 daily for a few days and if tolerated then restart 1 twice daily. If not tolerated reduce the dosage if needed. May need to stop Wellbutrin if not tolerating the stimulant.  Yes.  DC Wellbutrin Restart Prozac 20 mg daily.  February 2021 appointment with the following noted: Completed grant proposal.  Couldn't doit without Adderall.  Sold a bunch of work.   Adderall XR lasts about 3 pm.  Strength seems about right.  BP been OK.  Not jittery.   Stopped Wellbutrin but had no SE. Mood drastically better with grant proposal and back on fluoxetine.  Less depressed and lethargic.  No anxiety.  Cut back on coffee. Started back with devotions and stronger faith. Plan: Continue Prozac 20 mg daily. May have to increase the dose at some point in the future given that she usually was taking higher dosages but she is getting good response at this time. Restart Wellbutrin off label for ADD since can't get 2 ADDERALL daily. 150 mg daily then 300 mg daily. She can adjust the dose between 150 mg and 300 mg daily to get the optimal effect.   05/11/2020 appointment with the following noted: Has been inconsistent with Prozac and Wellbutrin. Not sure of the effect of Wellbutrin. Biggest deterrent in work is anxiety.   Some of the work is conceptual and difficult at times.  Can feel she's not up to a project at times.  Overall is OK but would like a steadier benefit from stimulant.  Exhausted from managing concentration and keeping up with things from the day.  Loses things.  Not good keeping up with schedule. Overall productive and emotionally OK. Can feel Adderall wear off. Mood is better in summer and worse in the winter.   F died in September 25, 2023 and that is a loss. No SE Wellbutrin. Still attends AA meetings.  Real benefit from [place] last year. Recognizes effect of anemia on ADD and mood.  Had iron infusions last winter. Plan:  Wellbutrin off label for ADD since can't get 2 ADDERALL daily. 150 mg daily then 300 mg daily.  01/24/2021 appointment with following noted: Doing a program called [employer] mindfulness app since Xmas.  CBT app helped the depression.  App helped her focus better.  Lost sign weight. Writing a lot. Before Xmas felt depressed and started negative thinking worse, self denigrating. Not drinking. More isolated.   Recognizes mo is narcissist.    Didn't tell anyone she was born until 3 mos later.  M aloof and uninterested in pt.  Lied about her birthday.  Mo lack of affection even with pt's kids. Going to [place] for a year and it helped her to quit drinking. Also misses kids being gone  with a hole also. Plan: No med changes  05/04/2021 appointment with the following noted: Therapist [name] thinks she's manic. Lost weight to 144#.   States she is still sleeping okay.  Admits she is hyper and recognizes that she is likely manic.  She feels great, euphoric with an increased sense of spiritual connectedness to God.  She has racing thoughts and talks fast and talks a lot and this is noted by her husband.  He thinks she is a bit hyper.  She has been able to maintain sobriety although she will have 1 glass of wine on special occasions but does not drink by herself.  She is not drinking to  excess.  She denies any dangerous impulsivity.  She is clearly not depressed and not particularly anxious.  She has no concerns about her medication and she has been compliant.  06/16/21 appt noted: So much better.  Going through a lot but the manic thing happened on top of it.  So much slower.  Didn't feel like losing anything with risperidone.  Likes the Adderalll at 10 mg. Some drowsiness in the AM and very drowsy from risperidone 2 mg HS. Prayer life is better. Handling stress better. Less depressed with risperidone. Still likes trazodone. Sleeps well. Plan: Reduce Prozac to 10 mg daily.  Consider stopping it because it can feel the mania however she is reluctant to do that because she fears relapse of depression. Reduce risperidone to 1.5 mg nightly due to side effects.  Discussed risk of worsening mania.  07/25/2021 appointment with the following noted: Misses the Adderall and hard to function without it. Depressed now. Heavy chest.  Anxious and guilty.  Body feels heavy.   Hates Wellbutrin.   Plan: Increase fluoxetine to 20 mg daily Add Abilify 1/2 of 15 mg tablet daily Wean wellbutrin by 1 tablet each week  bc she feels it is not helpful and DT polypharmacy Reduce risperidone to 1 daily for 1 week and stop it. Disc risk of mania. Increase Adderall to XR 20 mg AM  08/08/21 Much less depressed and starting to feel normal I feel a lot better. No SE.  Speech normal off risperidone. Sleeping OK on trazaodone and enough.   Noticed benefit from Adderall again. Plan: continue fluoxetine to 20 mg daily Continue Abilify 1/2 of 15 mg tablet daily for depression and mania continue Adderall to XR 20 mg AM  10/10/2021 phone call: Pt stated she feels like the Abilify should be decreased to 23m.She said she is depressed but rational and not suicidal.She has an appt Monday and can wait until then if you prefer. MD response: Reduce the Abilify to 7.5 mg every other day.  We will meet on 10/16/2021  and decide what to do from there.  10/16/2021 appointment with the following noted: More depressed.  Most depressed I've ever been.  Just numb.  Sense of grief.   Thinks the manic episode was unlike anything else she ever had.  Doesn't want to medicate against it.  Don't enjoy people.  Easily overwhelmed.  Had some death thoughts but not suicidal.  Has been functional.  Feels better today after reducing Abilify to every other day but she is only been doing that for 3 days. A/P: Episode of post manic depression was explained. continue fluoxetine to 20 mg daily Hold Abilify for 1 week then resume Abilify 1/2 of 15 mg tablet every other day for depression and mania continue Adderall to XR 20 mg AM  10/27/2021  appointment with the following noted: I'm doing so much better.  Handling the depression better. Better self talk and spiritual focus has helped.   Dep 6/10 manifesting as anxiety with low confidence.   F died 2  years ago and M 66 yo and is dependent . She is working hard to feel better but still feels depressed.  She almost feels like she has a little more anxiety since restarting Abilify every other day. Plan: continue fluoxetine to 20 mg daily DC Abilify .  Vrayalar 1.5 mg QOD to try to get rid of depression ASAP. continue Adderall to XR 20 mg AM  11/10/2021 appointment with the following noted: Busy with Xmas and it was fun with family but then a big let down.  Did well with it.  Functioned well with it.  Working hard on things with depression.  Not shutting down. Not sure but feels better today but yesterday was hard.  Difficulty dealing with mother.  She won't do anything to help herself.  Yesterday with her all day.  Won't do PT and has isolated herself.    Lack of confidence.   No SE with Vraylar.  11/24/21 urgent appointment appt noted: More and more depressed.   So anxious and doesn't want to be alone but can do so. No appetite. Hurts inside. Has had some fleeting suicidal  thoughts but would not act on them.  Tolerating meds. Has been consistent with Vraylar 1.5 mg every other day, fluoxetine 20 mg daily Plan: Increase Vraylar to 1.5 mg daily Change Prozac to Trintellix 10 mg daily. Discussed side effects of each continue Adderall to XR 20 mg AM  12/27/2021 appointment with the following noted: Not OK.  I feel less depressed but feels bat shit. Not sleeping well.  Extremely anxious. Off and on sleep. 3-4 hours of sleep.   Still having daily SI.  But also become obvious has so much to do.  Overwhelmed by tasks.   Needs anxiety meds to just function. Not more motivated.  Walked yesterday.   Feels afraid like in trouble but not irritable or angry. DC DT agitation Vraylar to 1.5 mg daily Change Prozac to Trintellix 10 mg daily. Hold Adderall to XR 20 mg AM Clonidine 0.1 1/2 tablet twice daily for 2 days and if needed for anxiety and sleep increase to 1 twice daily Ok temporary Ativan 1 mg 3 times daily as needed anxiety  01/05/22 appt noted: Off fluoxetine and  Trintellix.  Only on Ativan, trazodone and Adderall XR 20 plus added clonidine 0.1 mg BID Didn't think she needed to start Trintellix. Not taking Ativan.   Didn't like herself last week. Feels some better today. Wonders if the manic sx Not agitated.  Anxiety kind of calmed down.  A lot to be anxious about situationally.  $ stress. Concerns about downers with meds. Can't access normal personality. ? Lethargy and inability to talk as sE. Plan: Latuda 20-40 mg daily with food. Adderall to XR 20 mg AM Clonidine 0.1 1/2 tablet twice daily  reduce dose to be sure no SE Ok temporary Ativan 1 mg 3 times daily as needed anxiety  01/19/22 appt noted: Taking Latuda 20 mg daily.  Took 40 mg once and felt anxious and  SI Still depressed and not very reactive Anxiety mainly about the depression and fears of the future. She wants to revisit manic sx and thinks it was maybe bc taking delta 8 bc was taking a lot  of it so still  doesn't think she's classic bipolar. She wants to only take Prozac bc thinks Latuda is perpetuating depression. Says the delta 8 was very psychaedelic.  When not taking it was not manic.  Sleeping ok again.  Plan: Per her request DC Latuda 20-40 mg daily with food. She wants to continue Prozac alone AMA  Adderall to XR 20 mg AM Clonidine 0.1 1/2 tablet twice daily  reduce dose to be sure no SE Ok temporary Ativan 1 mg 3 times daily as needed anxiety  01/23/2022 phone call complaining of increased anxiety since stopping Latuda.  She will try increasing clonidine.  01/26/2022 phone call not feeling well and wanted to restart the Vraylar.  However notes indicate that had made her agitated therefore she was encouraged to pick up samples of Rexulti 1 mg and start that instead.  02/06/2022 phone call: Stating she felt the Rexulti was helping with depression but she was not sleeping well and obsessing over things.  She was encouraged to increase Rexulti to 2 mg daily and increase trazodone for sleep.  02/09/2022 appointment with the following noted: This was an urgent work in appointment No sleep last night with trazodone 100 mg HS Nothing really better depression or anxiety. Ruminating negative anxious thoughts. Did not tolerate Rexulti because it was causing insomnia.  Does not think it helped depression.  Lacks emotion that she should have.  Lacks her usual personality.  Some hopeless thoughts.  Some death thoughts.  Some suicidal thoughts without plan or intent Plan: DC Rexulti and Prozac & DC trazodone Adderall to XR 20 mg AM Clonidine 0.1 1/tablet twice daily  reduce dose to be sure no SE Ok temporary Ativan 1 mg 3 times daily as needed anxiety Start Seroquel XR 150 mg nightly  03/02/2022 appointment: Langley Gauss called back a few days after starting Seroquel stating it was making her more anxious and more depressed.  This seemed unlikely as this medicine rarely ever causes anxiety.   She stopped the medication waited 3 days and called back still had anxiety and depression but thought perhaps the anxiety was a little better.  She did not want to take the Seroquel. She knew about the option of Spravato and wanted to pursue that. Now questions whether to return to Seroquel while waiting to start Spravato bc feels just as bad without it and knows she didn't give it enough time to work.   MADRS 46  ECT-MADRS    Flowsheet Row Clinical Support from 08/06/2022 in [place] from 07/04/2022 in [place] from 05/21/2022 in [facility] Visit from 03/02/2022 in [facility]  MADRS Total Score _0 46      03/14/22 appt noted: Pt received Spravato 56 mg first dose today with some dissociative sx which were not severe.  She was anxious prior to the administration and felt better after receiving lorazepam 1 mg.  No NV, or HA. Wants to continue Spravato. Ongoing depression and desperate to feel better.  I'm not myself DT deprsssion which is most severe in recent history.  Anhedonia.  Low motivation.  Social avoidance. Continues to think all recent med trials are making her worse.  Sleep ok with Seroquel.  03/16/22 appt noted: Received Spravato 84 mg for the first time.  some dissociative sx which were not severe.  She was anxious prior to the administration and felt better after receiving lorazepam 1 mg.  No NV, or HA. Wants to continue Spravato.  Does not feel any better or different since the last appt.  Ongoing depression.  Ongoing depression and desperate to feel better.  I'm not myself DT deprsssion which is most severe in recent history.  Anhedonia.  Low motivation.  Social avoidance. Continues to think all recent med trials are making her worse.  Sleep ok with Seroquel.  Does not want to continue Seroquel for TRD.  03/20/2022 appointment noted: Came for Spravato  administration today.  However blood pressure was significantly elevated approximately 180/115.  She was given lorazepam 1 mg and clonidine 0.2 mg to try to get it down. She states she regretted stopping the Seroquel XR 300 mg tablets.  She now realizes it was helpful.  She did not sleep much at all last night.  She did not take the Adderall this morning. 2 to 3 hours after arrival blood pressure was still elevated at  170/110, 62 pulse.  For Spravato administration was canceled for today.  She admits to being anxious and depressed.  She is not suicidal.  She is highly motivated to receive the Spravato.  We discussed getting it tomorrow.  03/22/2022 appointment noted: Patient's blood pressure was never stable enough yesterday in order to get her in for Spravato administration.  She was encouraged to see her primary care doctor.  It is better today.  03/26/2022 appointment with the following noted: Blood pressure was better.  Saw her primary care doctor who started on oral Bystolic 2.5 mg daily. Received Spravato 84 mg today as scheduled.  Tolerated it well without nausea or vomiting headache or chest pain or palpitations.  Her blood pressure was borderline but manageable. She remains depressed and anxious.  She is ambivalent about the medicine and desperate to get to feel better.  Continues to have anhedonia and low energy and low motivation and reduced ability to do things.  Less social.  Not suicidal.  03/28/22 appt noted: Received Spravato 84 mg today as scheduled.  Tolerated it well without nausea or vomiting headache or chest pain or palpitations.  Her blood pressure was borderline but manageable. Has not seen any improvement so far.  Tolerating Seroquel.  Inconsistent with Bystolic and BP has been borderline high. Still depressed and anxious and anhedonia.  Low motivation, energy, productivity. Taking quetiapine and tolerating XR 300 mg nightly.  04/04/22 appt noted: Received Spravato 84 mg  today as scheduled.  Tolerated it well without nausea or vomiting headache or chest pain or palpitations.  Her blood pressure was borderline but manageable. Has not seen any improvement so far.  Tolerating Seroquel.   She still tends to think that the medications are making her worse.  She has said this about each of the recent psychiatric medicines including Seroquel.  However her husband thinks she is improved.  She also admits there is some improvement in productivity.  She still feels highly anxious.  She still does not enjoy things as normal.  She still feels desperate to improve as soon as possible. Has been taking Seroquel XR since 03/20/2022  04/10/22 appt noted: Received Spravato 84 mg today as scheduled.  Tolerated it well without nausea or vomiting headache or chest pain or palpitations.  Her blood pressure was borderline but manageable. Has not seen any improvement so far.  Tolerating Seroquel.  Doesn't like Seroquel bc she thinks it flattens here. 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  Tolerated it well without nausea or vomiting headache or chest pain or palpitations.  Her blood pressure was borderline but manageable. Has not seen any improvement so far.  Tolerating Seroquel.  Doesn't like Seroquel bc she thinks it flattens her. Received Spravato 84 mg today as scheduled.  Tolerated it well without nausea or vomiting headache or chest pain or palpitations.  Her blood pressure was borderline but manageable. Has not seen any improvement so far.  Tolerating Seroquel.  Doesn't like Seroquel bc she thinks it flattens here.  We discussed her ambivalence about it. She is starting Auvelity and has tolerated it the last 2 days without side effect.  She still does not feel like herself and feels flat and not enjoying things  with suppressed expressed emotion  04/17/2022 appointment with the following noted: Received Spravato 84 mg today as scheduled.  Tolerated it well without nausea or vomiting headache or chest pain or palpitations.  Her blood pressure was borderline but manageable. Has not seen any improvement so far.  Tolerating Seroquel.  Doesn't like Seroquel bc she thinks it flattens her. She has been tolerating the Auvelity 1 in the morning without side effects for about a week.  She has not noticed significant improvement so far.  She still feels depressed and flat and not herself.  Other people notice that she is flat emotionally.  She is not suicidal.  She does feel discouraged that she is not getting better yet.  04/19/2022 appointment noted: Has increased Auvelity to 1 twice daily for 2 days, continues quetiapine XR 300 mg nightly, clonidine 0.3 mg twice daily, lorazepam 1 mg twice daily for anxiety and Adderall XR 20 mg in the morning. No obious SE but she still thinks quetiapine XR is making her feel down.  But not sedated Received Spravato 84 mg today as scheduled.  Tolerated it well without nausea or vomiting headache or chest pain or palpitations.  Her blood pressure was borderline but manageable. She still feels quite anxious and feels it necessary to take both the clonidine and lorazepam twice a day to manage her anxiety.  She has been consistently down and flat and not herself until yesterday afternoon she noted an improvement in mood and feeling much more like herself with her normal personality reemerging.  She was quite depressed in the morning with very dark negative thoughts.  She did not have those dark negative thoughts this morning.  She had a lot of questions about medication and when she was expecting to be improved and why she has not shown improvement up to now.  04/23/22 appt noted: Has increased Auvelity to 1 twice daily for 1 week, continues quetiapine XR 300 mg nightly, clonidine 0.3 mg  twice daily, lorazepam 1 mg twice daily for anxiety and Adderall XR 20 mg in the morning. No obious SE but she still thinks quetiapine XR is making her feel down.  But not sedated Received Spravato 84 mg today as scheduled.  Tolerated it well without nausea or vomiting headache or chest pain or palpitations.  She is still depressed but admits better function and is able to enjoy social interactions. Tolerating meds.  Would like to feel better for sure. Not herself.  Flat. Plan increase Auvelity to 1 tab BID as planned and reduce Quetiapine to 1/2 of ER 300 mg  bc NR for depression.  04/25/2022 appointment with the following noted: clonidine 0.3 mg twice daily, lorazepam 1 mg twice daily for anxiety and Adderall XR  20 mg in the morning. Seroquel XR 300 HS No obious SE but she still thinks quetiapine XR is making her feel down.  But not sedated Received Spravato 84 mg today as scheduled.  Tolerated it well without nausea or vomiting headache or chest pain or palpitations.  Called yesterday with more anxiety.  Had increased Auvelity for 1 day and reduced Seroquel XR for 1 day.  Felt restless and fearful  05/01/2022 appointment noted: clonidine 0.3 mg twice daily, lorazepam 1 mg twice daily for anxiety and Adderall XR 20 mg in the morning. Seroquel XR 150 HS, Auvelity 1 BID Received Spravato 84 mg today as scheduled.  Tolerated it well without nausea or vomiting headache or chest pain or palpitations.  Nurse has noted patient has called multiple times sometimes asking the same question repeatedly.  It is unclear whether she is truly forgetful or is just anxious seeking reassurance. Patient acknowledges ongoing depression as well as some anxiety but states she has felt a little better in the last couple of days.  She has reduced the Seroquel to 150 mg at night and has increased Auvelity to 1 twice daily but only for 1 day.  So far she seems to be tolerating it.  05/03/22 appt noted: clonidine 0.2 mg  twice daily, lorazepam 1 mg twice daily for anxiety and Adderall XR 20 mg in the morning. Seroquel XR 150 HS, Auvelity 1 BID BP high this am about 170/100 and received extra clonidine 0.2 mg and came to receive Spravato.  Not dizzy, no SOB, nor CP but BP is still high Could not receive Spravato today bc BP high and pulse low at 30 ppm. Still depressed and anxious. Plan: continue trial Auvelity with Spravato She needs to get BP and pulse managed  05/08/22 TC: RTC  H Michael NA and mailbox full.  Could not leave message.  Pt  -  talked to she and H on speaker. H worried over wife.  Vacant stare.  Slurs words at times.  Not smiling. Reduced enjoyment.  Depression.  Withdrawn from usual activities.  Some irritability.  Anxious. Disc her concerns meds are making her worse.  Extensive discussion about her treatment resistant status.  There is a consistent pattern of not taking the medicines long enough to get benefit because she believes the meds are making her worse.  However the symptoms she describes as side effects are exactly the same symptoms that she had prior to taking the medication RX for  the depression.  So it is not clear that these are actual side effects. This is true about the 2 most recent meds including Seroquel and Auvelity.  Recommend psychiatric consultation in hopes of improving her comfort level with taking prescribed medications for a sufficient length of time to provide benefit. Extensive discussion about ECT is the treatment of choice for treatment resistant depression.  Spravato may work if she can comply with consistency.  There are medication options but they take longer to work.   Plan:  Reduce clonidine to 0.1 mg BID DT bradycardia.  Talk with PCP about BP and low pulse problems which are interfering with her consistent compliance with Spravato.   Limit lorazepam to 3 -4  mg daily max. Excess use is the cause of slurring speech.  She must stop excess use or will have to stop  the med. Stop Auvelity per her request.  But she has only been on the full dose for a little over a week and clearly has  not had time to get benefit from it.  She thinks maybe it is making her more anxious. Reduce Seroquel from 150XR to 50 -100 mg at night IR.  She couldn't sleep when stopped it completely. Will not start new antidepressant until her SE issues are resolved or not. Get second psych opinion from Yehuda Budd MD or another psychiatrist.  H's sister is therapist in [place] Hoyer, MD, [facility]  05/16/2022 appointment with the following noted: Received Spravato 84 mg today as scheduled.  Tolerated it well without nausea or vomiting headache or chest pain or palpitations.  She stopped Auvelity as discussed last week. On her own, without physician input, she restarted Wellbutrin XL 450 mg every morning today.  She had taken it in the past.  She feels jittery and anxious. She feels less depressed than she did last week.  But she is still depressed without her usual range of affect.  She still is less social and less motivated than normal. Her primary care doctor increased the dose of losartan Plan: Stop Seroquel Reduce Wellbutrin XL to 300 mg every morning.  Starting the dose at 450 every morning is likely causing side effects of jitteriness and it should not be started at that have a dosage. Recommend she not change meds on her own without MDM put  05/23/2022 appointment with the following noted: Received Spravato 84 mg today as scheduled.  Tolerated it well without nausea or vomiting headache or chest pain or palpitations.  Has not dropped seroquel XR 300 mg 1/2 tablet nightly bc couldn't sleep without it. Has not tried lower dose quetiapine 50 mg HS Still feels depressed.   BP is better managed so far, just saw PCP.  BP is better today and infact is low today. Dropped clonidine as directed from 0.3 mg BID bc inadequate control of BP to 0.2 mg BID.  However she wants to increase it  back to 0.3 mg twice daily because she feels it helped her anxiety better.  Wonders about increasing Wellbutrin for depression.  However she has only been on 300 mg a day for a week.  She was on 450 mg daily in the past.  06/06/22 appt noted: Received Spravato 84 mg today as scheduled.  Tolerated it well without nausea or vomiting headache or chest pain or palpitations.  She is still depressed and anxious.  She wants to try to stop the Seroquel but cannot sleep without some of it.  She is taking lorazepam 1 mg 4 times daily and still having a lot of anxiety.  She wants to increase clonidine back to 0.3 mg twice daily.  She hopes for more improvement She recently went for a second psychiatric opinion as suggested the results of that are pending.  06/11/22 appt noted: Received Spravato 84 mg today as scheduled.  Tolerated it well without nausea or vomiting headache or chest pain or palpitations.  She is still depressed and anxious. Without much change.  Still hopeless, anhedonia, reduced inteterest and motivation.  Tolerating meds. Disc concerns Spravato is not hleping much. Plan: stop Seroquel and start olanzapine 10 mg HS for TRD and anxiety.  06/13/2022 appointment noted: Received Spravato 84 mg today as scheduled.  Tolerated it well without nausea or vomiting headache or chest pain or palpitations.  She is still depressed and anxious. Without much change.  Still hopeless, anhedonia, reduced inteterest and motivation.  Tolerating meds. Disc concerns Spravato is not helping much as hoped but is improving a bit in the  last week. Tolerating meds. Continues Wellbutrin XL 450 AM, tolerating recently started olanzapine  10 mg HS. Sleep is good.   Pending appt with [place] consult.  06/18/22 appt noted: Received Spravato 84 mg today as scheduled.  Tolerated it well without nausea or vomiting headache or chest pain or palpitations.  Tolerating meds. Continues Wellbutrin XL 450 AM, tolerating recently started  olanzapine  10 mg HS. Continues Adderall XR 20 amd and has tried to reduce lorazepam to 46m TID Sleep is good.   [facility] [facility] Depression is a little bit better in the last week with a little improvement in emotional expression and interest.  She is pushing herself to be more active.  Her daughter thought she was a little better than she has been.  However she is still depressed and still not her normal self with anhedonia and reduced emotional expressiveness.  06/20/22 appt noted: Received Spravato 84 mg today as scheduled.  Tolerated it well without nausea or vomiting headache or chest pain or palpitations.  Tolerating meds with a little sleepiness. Continues Wellbutrin XL 450 AM, tolerating recently started olanzapine  10 mg HS. Continues Adderall XR 20 amd and has tried to reduce lorazepam to 165mTID Sleep is good.   Mood is improving.  Better funciton.  Anxiety is better with olanzapine. Still not herself and depression not gone with some anhedonia and social avoidance and feeling overwhelmed.  8/14 2023 received Spravato 84 mg 06/27/2022 received Spravato Spravato 84 mg 07/02/2022 received Spravato 84 mg 07/04/2022 received Spravato 84 mg  07/09/2022 appointment noted: Received Spravato 84 mg today as scheduled.  Tolerated it well without nausea or vomiting headache or chest pain or palpitations.  Expected dissociation and feels less depressed with resolution of negative emotions immediately after Spravato and then depression, anxiety creep back in. Continues meds Adderall XR 20 mg every morning, Wellbutrin XL 450 every morning, clonidine 0.1 mg twice daily, lorazepam 1 mg every 6 hours as needed, olanzapine increased from 7.5 to 10 mg nightly on  Tolerating meds.  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  She does note some increased appetite and is  somewhat concerned about that but has not gained significant amounts of weight. [place] [facility] which was initially [place] but she knows it can be appealed.  However because she is improving with Spravato plus the other medications now she wants to continue the current treatment plan.  07/18/22 appt noted: Continues meds Adderall XR 20 mg every morning, Wellbutrin XL 450 every morning, clonidine 0.1 mg twice daily, lorazepam 1 mg every 6 hours as needed, olanzapine increased from 7.5 to 10 mg nightly on 07/04/2022. Received Spravato 84 mg today as scheduled.  Tolerated it well without nausea or vomiting headache or chest pain or palpitations.  Expected dissociation and feels less depressed with resolution of negative emotions immediately after Spravato and then depression, anxiety creep back in. Continues meds Adderall XR 20 mg every morning, Wellbutrin XL 450 every morning, clonidine 0.1 mg twice daily, lorazepam 1 mg every 6 hours as needed, olanzapine increased from 7.5 to 10 mg nightly on  Tolerating meds.  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  She does note some increased appetite and is somewhat concerned about that  but has not gained significant amounts of weight. She has had the [place] consultation which was initially denied but she knows it can be appealed. She continues to have chronic ambivalence about psychiatric medicines and initially tends to blame her depressive symptoms such as decreased concentration and feeling flat on what ever medicine she currently is taking even though she had the same symptoms before the current medicines were started.  Then after discussion she does admit that her depressive symptoms are improved since adding olanzapine but still has those residual symptoms noted.  07/23/22 received Spravato 84 mg   07/30/2022 appointment  noted: Received Spravato 84 mg today as scheduled.  Tolerated it well without nausea or vomiting headache or chest pain or palpitations.  Expected dissociation and feels less depressed with resolution of negative emotions immediately after Spravato and then depression, anxiety creep back in. Continues meds Adderall XR 20 mg every morning, Wellbutrin XL 450 every morning, clonidine 0.1 mg twice daily, lorazepam 1 mg every 6 hours as needed, olanzapine increased from 7.5 to 10 mg nightly on  She has been inconsistent with olanzapine because she continues to be ambivalent about the medications in general and thinks that perhaps the 10 mg is making her feel blunted.  She continues to feel some depression.  She had a good day this week and but still feels somewhat depressed and persistently anxious. Plan: be consistent with olanzapine 10 mg HS for TRD and longer trial for potential benefit for anxiety.  Has not taken it consistently.  08/06/2022 appointment noted: Received Spravato 84 mg today as scheduled.  Tolerated it well without nausea or vomiting headache or chest pain or palpitations.  Expected dissociation and feels less depressed with resolution of negative emotions immediately after Spravato and then depression, anxiety creep back in. Continues meds Adderall XR 20 mg every morning, Wellbutrin XL 450 every morning, clonidine 0.1 mg twice daily, lorazepam 1 mg every 6 hours as needed, olanzapine i 10 mg nightly  She continues to feel depressed but is about 50% better with Spravato.  She is still not herself.  She still has anhedonia.  She still is not her able to engage socially in the typical ways.  She is not jovial and outgoing like normal.  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  She continues to feel that her personality is dampened down.  There is a question about whether it is  related to depression or medication. Plan: continue olanzapine 10 for longer trial for TRD and severe anxiety.  08/13/22 appt noted:  Received Spravato 84 mg today as scheduled.  Tolerated it well without nausea or vomiting headache or chest pain or palpitations.  Expected dissociation and feels less depressed with resolution of negative emotions immediately after Spravato and then depression, anxiety creep back in. Continues meds Adderall XR 20 mg every morning, Wellbutrin XL 450 every morning, clonidine 0.1 mg twice daily, lorazepam 1 mg every 6 hours as needed, olanzapine i 10 mg nightly  She still does not feel herself.  Still struggles with depression and low motivation and reduced social engagement and reduced interest and reduced emotional expression.  She is somewhat better with the medicines plus Spravato.  She still believes the Spravato makes her blunted and is not sure how much it helps her anxiety.  She can have good days when her family is around and  she is engaged.  She still wants to stop the olanzapine. She has apparently continued to take the trazodone despite having been told to stop it when she started olanzapine.  She feels like she needs the trazodone. Plan: DC olanzapine and Start nortriptyline 25 mg nightly and build up to 75 mg nightly and then check blood level.    08/27/2022 appointment noted: Received Spravato 84 mg today as scheduled.  Tolerated it well without nausea or vomiting headache or chest pain or palpitations.  Expected dissociation and feels less depressed with resolution of negative emotions immediately after Spravato and then depression, anxiety creep back in. Continues meds Adderall XR 20 mg every morning, Wellbutrin XL 450 every morning, clonidine 0.1 mg twice daily, lorazepam 1 mg every 6 hours as needed. Stopped olanzapine and started nortriptyline which she has taken for about a week is 75 mg nightly. So far she is tolerating the nortriptyline well with the  exception of some dry mouth and constipation which she is working to manage.  She does not feel substantially better better or different off the olanzapine.  No change in her sleep which is good.  Main concern currently in addition to the residual depression is anxiety which is somewhat situational with pending arch show.  She is worrying about it more than normal.  Says she is having to take lorazepam twice a day where she had been able to keep reduce it prior to this.  She still does not feel like herself with residual depression with less social interest and less of her usual buoyancy in personality.  She is flatter than normal.  Overall she still feels that the Spravato has been helpful at reducing the severity of the depression.  She is not suicidal. She has not heard anything about the [place] appeal as of yet.  09/05/2022 appointment noted: Received Spravato 84 mg today as scheduled.  Tolerated it well without nausea or vomiting headache or chest pain or palpitations.  Expected dissociation and feels less depressed with resolution of negative emotions immediately after Spravato and then depression, anxiety creep back in. Continues meds Adderall XR 20 mg every morning, Wellbutrin XL 450 every morning, clonidine 0.1 mg twice daily, lorazepam 1 mg every 6 hours as needed. Stopped olanzapine and started nortriptyline which she has taken for about 2 week is 75 mg nightly. Initially blood pressure was a little high causing delay in starting Spravato.  She admitted to feeling a little wound up.  She still experiences a little increase in depression if she goes longer than a week in between doses of Spravato.  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  09/10/22 appt noted: Received Spravato 84 mg today as scheduled.  Tolerated it well without nausea or vomiting headache or chest pain or palpitations.   Expected dissociation gradually resolved over the 2 hour observation period. She feels 50% less depressed with Spravto and wants to continue it.   Continues meds Adderall XR 20 mg every morning, Wellbutrin XL 450 every morning, clonidine 0.1 mg twice daily, lorazepam 1 mg every 6 hours as needed. Has started nortriptyline 75 mg nightly for about 3 weeks. Has not seen a significant difference with the addition of nortriptyline.  Tolerating it pretty well. She continues to have some degree of anhedonia and significant depression and anxiety.  Her daughters noticed that she is more needy and  calls more frequently.  She acknowledges this as well.  She is clearly still not herself. Plan: pramipexole off label and RX 0.25 mg BID  09/17/2022 appointment noted: Received Spravato 84 mg today as scheduled.  Tolerated it well without nausea or vomiting headache or chest pain or palpitations.  Expected dissociation gradually resolved over the 2 hour observation period. She feels 50% less depressed with Spravto and wants to continue it.   Continues meds Adderall XR 20 mg every morning, Wellbutrin XL 450 every morning, clonidine 0.1 mg twice daily, lorazepam 1 mg every 6 hours as needed. Has started nortriptyline 75 mg nightly for about 3 weeks and DT level 176, reduced to 50 mg HS early November. Still the same sx as noted last visit.  Tolerating meds.   Compliant.  Still depressed and family notices.  Has been able to participate in family interactions.  Some post-show let down and has to do detailed work which is hard for her bc ADD.  Sleep and eating well.  Energy OK but not great.  No SI.  Not cried in a year or so.  Clearly less depressed and hopeless than before the Spravato.  09/24/22 appt noted: Received Spravato 84 mg today as scheduled.  Tolerated it well without nausea or vomiting headache or chest pain or palpitations.  Expected dissociation gradually resolved over the 2 hour observation period. She  feels 50% less depressed with Spravto and wants to continue it.   Continues meds Adderall XR 20 mg every morning, Wellbutrin XL 450 every morning, clonidine 0.1 mg twice daily, lorazepam 1 mg every 6 hours as needed. Has started nortriptyline 75 mg nightly for about 3 weeks and DT level 176, reduced to 50 mg HS early November. Still the same sx as noted last visit.  Tolerating meds.   Compliant.  Still depressed and family notices.  Has been able to participate in family interactions.  Some post-show let down and has to do detailed work which is hard for her bc ADD.  Sleep and eating well.  Energy OK but not great.  No SI.  Not cried in a year or so.  Clearly less depressed and hopeless than before the Spravato. Is not making further progress generally.  Stuck with moderate depression  10/02/22 appt noted: Received Spravato 84 mg today as scheduled.  Tolerated it well without nausea or vomiting headache or chest pain or palpitations.  Expected dissociation gradually resolved over the 2 hour observation period. She feels 50% less depressed with Spravto and wants to continue it.   Continues meds Adderall XR 20 mg every morning, Wellbutrin XL 450 every morning, clonidine 0.1 mg twice daily, lorazepam 1 mg every 6 hours as needed. Has started nortriptyline 75 mg nightly for about 3 weeks and DT level 176, reduced to 50 mg HS early November. Still the same sx as noted last visit.  Tolerating meds.   Compliant.  Still depressed and family notices.  Has been able to participate in family interactions.  Some post-show let down and has to do detailed work which is hard for her bc ADD.  Sleep and eating well.  Energy OK but not great.  No SI.  Not cried in a year or so.  Clearly less depressed and hopeless than before the Spravato. Is not making further progress generally.  Stuck with moderate depression.  Is able to function pretty normally. Plan: trial pramipexole 0.25 mg BID off label for depression.    10/08/22 appt noted: Received  Spravato 84 mg today as scheduled.  Tolerated it well without nausea or vomiting headache or chest pain or palpitations.  Expected dissociation gradually resolved over the 2 hour observation period. She feels 50% less depressed with Spravto and wants to continue it.   Continues meds Adderall XR 20 mg every morning, Wellbutrin XL 450 every morning, clonidine 0.1 mg twice daily, lorazepam 1 mg every 6 hours as needed. Has started nortriptyline 75 mg nightly for about 3 weeks and DT level 176, reduced to 50 mg HS early November. Still the same sx as noted last visit.  Tolerating meds.   Compliant.  Still depressed and family notices.  Has been able to participate in family interactions.  Some post-show let down and has to do detailed work which is hard for her bc ADD.  Sleep and eating well.  Energy OK but not great.  No SI.  Not cried in a year or so.  Clearly less depressed and hopeless than before the Spravato. Is not making further progress generally.  Stuck with moderate depression.  Behaved and felt pretty normally with family over for Thanksgiving. Doesn't see benefit or SE with pramipexole but thinks maybe it makes her worse.   ECT-MADRS    Flowsheet Row Clinical Support from 08/06/2022 in [facility] from 07/04/2022 in [facility] [place] from 05/21/2022 in [place] Visit from 03/02/2022 in [facility]  MADRS Total Score _0 46        Past Psychiatric Medication Trials: fluoxetine, duloxetine, Viibryd, lamotrigine, Pristiq, sertraline, citalopram,  Trintellix anxious and SI Wellbutrin XL 450 [place] 1 dose  Adderall, Adderall XR, Vyvanse, Ritalin, Strattera low dose NR Lorazepam Trazodone  Depakote,  lamotrigine cog complaints Lithium remotely Abilify 7.5  Vraylar 1.5 mg daily agitation and insomnia Rexulti insomnia Latuda 40 one dose, CO  anxious and SI Seroquel XR 300 Olanzapine 10  At visit November 12, 2019. We discussed Patient developed an increasingly severe alcohol dependence problem since her last visit in January.  She went to [facility] and has had no alcohol since then except 1 day.  She never abused stimulants but they took her off the stimulants at [facility].  Her ADD was markedly worse.  The Wellbutrin did not help the ADD.   D history lamotrigine rash at 66 yo  Review of Systems:  Review of Systems  Constitutional:  Positive for fatigue.  Cardiovascular:  Negative for chest pain and palpitations.  Musculoskeletal:  Positive for arthralgias and back pain. Negative for joint swelling.       SP hip surgery October 2020  Neurological:  Negative for dizziness and tremors.  Psychiatric/Behavioral:  Positive for decreased concentration and dysphoric mood. Negative for agitation, behavioral problems, confusion, hallucinations, self-injury, sleep disturbance and suicidal ideas. The patient is nervous/anxious. The patient is not hyperactive.     Medications: I have reviewed the patient's current medications.  Current Outpatient Medications  Medication Sig Dispense Refill   amLODipine (NORVASC) 2.5 MG tablet Take 2.5 mg by mouth daily.     amphetamine-dextroamphetamine (ADDERALL XR) 20 MG 24 hr capsule Take 1 capsule (20 mg total) by mouth every morning. 30 capsule 0   amphetamine-dextroamphetamine (ADDERALL XR) 20 MG 24 hr capsule Take 1 capsule (20 mg total) by mouth every morning. 30 capsule 0   buPROPion (WELLBUTRIN XL) 150 MG 24 hr tablet TAKE 3 TABLETS BY MOUTH DAILY 270 tablet 1   cloNIDine (CATAPRES) 0.2 MG  tablet Take 1 tablet (0.2 mg total) by mouth 2 (two) times daily. 60 tablet 1   Dextromethorphan HBr 15 MG CAPS Take 1 capsule (15 mg total) by mouth every 12 (twelve) hours. 60 capsule 0   iron polysaccharides (NIFEREX) 150 MG capsule TAKE 1 CAPSULE BY MOUTH EVERY DAY 90 capsule 1   LORazepam  (ATIVAN) 1 MG tablet Take 1 tablet (1 mg total) by mouth every 8 (eight) hours as needed. for anxiety 90 tablet 0   losartan (COZAAR) 50 MG tablet Take 50 mg by mouth daily.     nebivolol (BYSTOLIC) 2.5 MG tablet Take 2.5 mg by mouth daily.     nortriptyline (PAMELOR) 25 MG capsule TAKE 3 CAPSULES (75 MG TOTAL) BY MOUTH AT BEDTIME. 270 capsule 1   pramipexole (MIRAPEX) 0.5 MG tablet Take 1 tablet (0.5 mg total) by mouth 2 (two) times daily. (Patient taking differently: Take 0.25 mg by mouth 2 (two) times daily.) 60 tablet 0   SPRAVATO, 84 MG DOSE, 28 MG/DEVICE SOPK USE 3 SPRAYS IN EACH NOSTRIL TWICE A WEEK 3 each 3   traZODone (DESYREL) 50 MG tablet 1-2 TABLETS NIGHTLY AS NEEDED FOR SLEEP 60 tablet 1   No current facility-administered medications for this visit.    Medication Side Effects: None  Allergies:  Allergies  Allergen Reactions   Metronidazole Shortness Of Breath and Other (See Comments)    Heart pounding   Ferrlecit [Na Ferric Gluc Cplx In Sucrose] Other (See Comments)    Infusion reaction 05/12/2019    Past Medical History:  Diagnosis Date   ADHD    Anemia    Anxiety    Arthritis    Depression    Heart murmur    i went to see a cardiologit slast eyar  and i had zero plaque,    PONV (postoperative nausea and vomiting)    Recovering alcoholic in remission ([place])     Family History  Problem Relation Age of Onset   Atrial fibrillation Mother    CAD Father     Past Medical History, Surgical history, Social history, and Family history were reviewed and updated as appropriate.   Please see review of systems for further details on the patient's review from today.   Objective:   Physical Exam:  There were no vitals taken for this visit.  Physical Exam Constitutional:      General: She is not in acute distress. Neurological:     Mental Status: She is alert and oriented to person, place, and time.     Coordination: Coordination normal.     Gait: Gait normal.   Psychiatric:        Attention and Perception: Attention and perception normal.        Mood and Affect: Mood is anxious and depressed. Affect is not labile, angry or tearful.        Speech: Speech is not rapid and pressured, slurred or tangential.        Behavior: Behavior is not slowed.        Thought Content: Thought content is not paranoid or delusional. Thought content does not include homicidal or suicidal ideation. Thought content does not include suicidal plan.        Cognition and Memory: Cognition normal. Memory is not impaired. She does not exhibit impaired recent memory.        Judgment: Judgment normal.     Comments: Insight intact. No auditory or visual hallucinations. No delusions.  Depression ongoing residual without  much change Affect still depressed but less blunted than it was before Spravato.  Still blunted No Sui intent plan      Lab Review:     Component Value Date/Time   NA 137 01/12/2021 1430   NA 140 11/18/2018 1544   K 3.8 01/12/2021 1430   CL 108 01/12/2021 1430   CO2 22 01/12/2021 1430   GLUCOSE 94 01/12/2021 1430   BUN 14 01/12/2021 1430   BUN 20 11/18/2018 1544   CREATININE 0.82 01/12/2021 1430   CALCIUM 8.9 01/12/2021 1430   PROT 6.6 01/12/2021 1430   ALBUMIN 3.9 01/12/2021 1430   AST 12 (L) 01/12/2021 1430   ALT 11 01/12/2021 1430   ALKPHOS 46 01/12/2021 1430   BILITOT 0.5 01/12/2021 1430   GFRNONAA >60 01/12/2021 1430   GFRAA >60 09/02/2019 0249   GFRAA >60 01/27/2019 0811       Component Value Date/Time   WBC 4.5 01/12/2021 1430   RBC 4.32 01/12/2021 1430   HGB 12.8 01/12/2021 1430   HGB 12.9 07/17/2019 0953   HCT 38.5 01/12/2021 1430   HCT 21.9 (L) 12/25/2018 1221   PLT 272 01/12/2021 1430   PLT 286 07/17/2019 0953   MCV 89.1 01/12/2021 1430   MCH 29.6 01/12/2021 1430   MCHC 33.2 01/12/2021 1430   RDW 12.4 01/12/2021 1430   LYMPHSABS 1.4 01/12/2021 1430   MONOABS 0.4 01/12/2021 1430   EOSABS 0.0 01/12/2021 1430   BASOSABS  0.0 01/12/2021 1430    No results found for: "POCLITH", "LITHIUM"   No results found for: "PHENYTOIN", "PHENOBARB", "VALPROATE", "CBMZ"   .res Assessment: Plan:    Recurrent major depression resistant to treatment ([place])  Generalized anxiety disorder  Attention deficit hyperactivity disorder (ADHD), predominantly inattentive type  Insomnia due to mental condition  Accelerated hypertension   She has treatment resistant major depression at this time.  Have  discussed some of her recent abnormal behaviors leading to this depressive episode getting worse which she says were associated with heavy use of delta 8 and not a manic episode.  She realizes now that that was not good for her.  She stopped all use of other drugs including those available over-the-counter such as delta 8 or any other THC related products.  She is no longer having any of those types of behaviors and instead is depressed.  She remains depressed with lack of interest and lack of feeling for things that normally she would have feelings about.  She has low motivation and energy.  She is difficulty making decisions.  She is sad and down.  She is less productive than usual.  Her concentration is poor.  She has high degree of anxiety as well.  Her personality is more flat than normal for her.. She has a very negative self-image and low self-esteem.  These are all uncharacteristic for her However all of these symptoms are partially improved through Spravato and the switch from Seroquel to olanzapine.  She is approximately 50% better.  Patient was administered Spravato 84 mg intranasally today.  The patient experienced the typical dissociation which gradually resolved over the 2-hour period of observation.  There were no complications.  Specifically the patient did not have nausea or vomiting or headache.  Blood pressures monitored at the 40-minute and 2-hour follow-up intervals.  Borderline high.  By the time the 2-hour  observation period was met the patient was alert and oriented and able to exit without assistance.  Patient feels the Spravato administration  is helpful for the treatment resistant depression and would like to continue the treatment.  See nursing note for further details.She wants to continue Spravato. We discussed discussed the side effects in detail as well as the protocol required to receive Spravato.   Failed multiple antidepressants.  Many of them were not actual failures but intolerances and it is unclear whether some of that was more connected with anxiety than true side effects.  1 example is the [place].  In general she does not want to try anything but an antidepressant but has failed all major categories of antidepressants except TCAs and MAO inhibitors which have not been tried until now. There is a consistent pattern of thinking she is worse with medication which is not consistent with objective evidence.    Her self assessment is clouded by her depression.  the next logical antidepressant would be to use a tricyclic antidepressant as she has not taken 1 of these medications before. Started nortriptyline 75 mg nightly.  Serum level 176.  Reduced to 50 mg HS  Discussed side effects in detail.  Needs more time to help. Extensive discussion previously about her ambivalence about meds and missatributing sx of depression as SE of meds.    continue Wellbutrin XL 450 mg AM  Off label augmentation DM at a lower dose of 15 mg BID-defer Instead increase pramipexole augmentation off label to 0.5 mg BID  Started Spravato 84 mg twice weekly on 03/16/2022.  Now on weekly administration  Adderall  XR 20 mg AM  Ok temporary Ativan 1 mg 3-4 times daily as needed anxiety but try to cut it back. Is not ideal to use benzodiazepine with stimulant but because of the severity of her symptoms it has been necessary.  Hope to eventually eliminate the benzodiazepine.  Expected as her depression improves her  anxiety will improve as well.  However lately her anxiety has been unmanageable.  We will expect that to improve as the depression improves.  She has headed insturctions to reduce this.  Continue clonidine 0.2 mg BID off label for anxiety and helps BP partially. BP is better controlled but not consistent.  Consider increasing amlodipine.  Also on losartan 50  Discussed potential benefits, risks, and side effects of stimulants with patient to include increased heart rate, palpitations, insomnia, increased anxiety, increased irritability, or decreased appetite.  Instructed patient to contact office if experiencing any significant tolerability issues. She wants to return to usual dose of Adderall for ADD bc of mor poor cognitive function with reduction.  Also discussed that depression will impair cognitive function.  [place] consultation was initially denied .  She says she called [facility] about this but they have not responded.    Extensive discussion about her chronic ambivalence about psychiatric medicines and tendency to blame medicine for the symptoms of depression that she had prior to even starting the medications.  After discussing this at length she was able to acknowledge that that is factual.  She does not appear to be having any significant side effects with the current medications.  However so far not much benefit with nortriptyline despite adequate level.  Has Maintained sobriety  FU with Spravato weekly   Lynder Parents, MD, DFAPA     Please see After Visit Summary for patient specific instructions.  No future appointments.             No orders of the defined types were placed in this encounter.      -------------------------------

## 2022-10-08 NOTE — Progress Notes (Signed)
NURSES NOTE:   Patient arrived for her 43rd Spravato treatment. Pt is being treated for Treatment Resistant Depression, pt will be receiving 84 mg which will continue to be her maintenance dose, she receives weekly treatments.  Pt is also planning on [place] as soon as insurance will approve her treatments, then she will stop Spravato at that time. Patient arrived and taken to treatment room. Confirmed she had a ride home which is her husband would be coming back to pick up pt when done and sometimes she needs to use [facility] if he is unable to pick her up. Pt's Spravato is ordered through [employer] and delivered to office, all Spravato medication is stored at doctors office per REMS/FDA guidelines. The medication is required to be locked behind two doors per FDA/REMS Protocol. Medication is also disposed of properly per regulations.      Began taking patient's vital signs at 9:45 AM 147/95, pulse 59, Pulse Ox 98%, Gave patient first dose 28 mg nasal spray, each nasal spray administered in each nostril as directed and waited 5 minutes between the second and third dose. All 3 doses given pt did not complain of any nausea/vomiting, given a cup of water due to the taste after the administration of Spravato.  She listens to [employer] with spa or relaxing music.  Checked 40 minute vitals at 10:30 AM, 151/92, pulse 54, Pulse Ox 94%. Explained she would be monitored for a total time of 120 minutes. Discharge vitals were taken at 11:40 AM 153/102 P 60, 99% Pulse Ox. Dr. Clovis Pu met with pt today and discussed her medication and her plans for [place]. I walked pt to elevator, where her husband met her for the ride home. Recommend she go home and sleep or just relax on the couch. No driving, no intense activities. Verbalized understanding. Nurse was with pt a total of 70 minutes for clinical. Pt is coming back next week, Monday. Pt instructed to call office with any problems or questions.      LOT [id number] EXP AUG 2025

## 2022-10-08 NOTE — Progress Notes (Addendum)
Laura Chang [id number] 1956-05-11 66 y.o.  Subjective:   Patient ID:  Laura Chang is a 66 y.o. (DOB 03-18-1956) female.  Chief Complaint:  No chief complaint on file.    HPI Laura Chang presents to the office today for follow-up of depression and anxiety and ADD.  seen November 12, 2019.  Melted down in 2020.  Went to [employer] in July.  No withdrawal.  1 drink since then.  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 She was on no stimulant and no SSRI but was taking Strattera and Wellbutrin.  The following changes were made. Stop Strattera. OK restart stimulant bc severe ADD Restart Adderall 1 daily for a few days and if tolerated then restart 1 twice daily. If not tolerated reduce the dosage if needed. May need to stop Wellbutrin if not tolerating the stimulant.  Yes.  DC Wellbutrin Restart Prozac 20 mg daily.  February 2021 appointment with the following noted: Completed grant proposal.  Couldn't doit without Adderall.  Sold a bunch of work.   Adderall XR lasts about 3 pm.  Strength seems about right.  BP been OK.  Not jittery.   Stopped Wellbutrin but had no SE. Mood drastically better with grant proposal and back on fluoxetine.  Less depressed and lethargic.  No anxiety.  Cut back on coffee. Started back with devotions and stronger faith. Plan: Continue Prozac 20 mg daily. May have to increase the dose at some point in the future given that she usually was taking higher dosages but she is getting good response at this time. Restart Wellbutrin off label for ADD since can't get 2 ADDERALL daily. 150 mg daily then 300 mg daily. She can adjust the dose between 150 mg and 300 mg daily to get the optimal effect.   05/11/2020 appointment with the following noted: Has been inconsistent with Prozac and Wellbutrin. Not sure of the effect of Wellbutrin. Biggest deterrent in work is anxiety.  Some of the work is conceptual and difficult at times.   Can feel she's not up to a project at times.  Overall is OK but would like a steadier benefit from stimulant.  Exhausted from managing concentration and keeping up with things from the day.  Loses things.  Not good keeping up with schedule. Overall productive and emotionally OK. Can feel Adderall wear off. Mood is better in summer and worse in the winter.   F died in 2023-11-10 and that is a loss. No SE Wellbutrin. Still attends AA meetings.  Real benefit from [place] last year. Recognizes effect of anemia on ADD and mood.  Had iron infusions last winter. Plan:  Wellbutrin off label for ADD since can't get 2 ADDERALL daily. 150 mg daily then 300 mg daily.  01/24/2021 appointment with following noted: Doing a program called [employer] mindfulness app since Xmas.  CBT app helped the depression.  App helped her focus better.  Lost sign weight. Writing a lot. Before Xmas felt depressed and started negative thinking worse, self denigrating. Not drinking. More isolated.   Recognizes mo is narcissist.    Didn't tell anyone she was born until 3 mos later.  M aloof and uninterested in pt.  Lied about her birthday.  Mo lack of affection even with pt's kids. Going to [place] for a year and it helped her to quit drinking. Also misses kids being gone with a hole also. Plan: No med changes  05/04/2021 appointment with the following  noted: Therapist [name] thinks she's manic. Lost weight to 144#.   States she is still sleeping okay.  Admits she is hyper and recognizes that she is likely manic.  She feels great, euphoric with an increased sense of spiritual connectedness to God.  She has racing thoughts and talks fast and talks a lot and this is noted by her husband.  He thinks she is a bit hyper.  She has been able to maintain sobriety although she will have 1 glass of wine on special occasions but does not drink by herself.  She is not drinking to excess.  She denies any dangerous impulsivity.  She is  clearly not depressed and not particularly anxious.  She has no concerns about her medication and she has been compliant.  06/16/21 appt noted: So much better.  Going through a lot but the manic thing happened on top of it.  So much slower.  Didn't feel like losing anything with risperidone.  Likes the Adderalll at 10 mg. Some drowsiness in the AM and very drowsy from risperidone 2 mg HS. Prayer life is better. Handling stress better. Less depressed with risperidone. Still likes trazodone. Sleeps well. Plan: Reduce Prozac to 10 mg daily.  Consider stopping it because it can feel the mania however she is reluctant to do that because she fears relapse of depression. Reduce risperidone to 1.5 mg nightly due to side effects.  Discussed risk of worsening mania.  07/25/2021 appointment with the following noted: Misses the Adderall and hard to function without it. Depressed now. Heavy chest.  Anxious and guilty.  Body feels heavy.   Hates Wellbutrin.   Plan: Increase fluoxetine to 20 mg daily Add Abilify 1/2 of 15 mg tablet daily Wean wellbutrin by 1 tablet each week  bc she feels it is not helpful and DT polypharmacy Reduce risperidone to 1 daily for 1 week and stop it. Disc risk of mania. Increase Adderall to XR 20 mg AM  08/08/21 Much less depressed and starting to feel normal I feel a lot better. No SE.  Speech normal off risperidone. Sleeping OK on trazaodone and enough.   Noticed benefit from Adderall again. Plan: continue fluoxetine to 20 mg daily Continue Abilify 1/2 of 15 mg tablet daily for depression and mania continue Adderall to XR 20 mg AM  10/10/2021 phone call: Pt stated she feels like the Abilify should be decreased to 25m.She said she is depressed but rational and not suicidal.She has an appt Monday and can wait until then if you prefer. MD response: Reduce the Abilify to 7.5 mg every other day.  We will meet on 10/16/2021 and decide what to do from there.  10/16/2021  appointment with the following noted: More depressed.  Most depressed I've ever been.  Just numb.  Sense of grief.   Thinks the manic episode was unlike anything else she ever had.  Doesn't want to medicate against it.  Don't enjoy people.  Easily overwhelmed.  Had some death thoughts but not suicidal.  Has been functional.  Feels better today after reducing Abilify to every other day but she is only been doing that for 3 days. A/P: Episode of post manic depression was explained. continue fluoxetine to 20 mg daily Hold Abilify for 1 week then resume Abilify 1/2 of 15 mg tablet every other day for depression and mania continue Adderall to XR 20 mg AM  10/27/2021 appointment with the following noted: I'm doing so much better.  Handling the depression  better. Better self talk and spiritual focus has helped.   Dep 6/10 manifesting as anxiety with low confidence.   F died 2  years ago and M 65 yo and is dependent . She is working hard to feel better but still feels depressed.  She almost feels like she has a little more anxiety since restarting Abilify every other day. Plan: continue fluoxetine to 20 mg daily DC Abilify .  Vrayalar 1.5 mg QOD to try to get rid of depression ASAP. continue Adderall to XR 20 mg AM  11/10/2021 appointment with the following noted: Busy with Xmas and it was fun with family but then a big let down.  Did well with it.  Functioned well with it.  Working hard on things with depression.  Not shutting down. Not sure but feels better today but yesterday was hard.  Difficulty dealing with mother.  She won't do anything to help herself.  Yesterday with her all day.  Won't do PT and has isolated herself.    Lack of confidence.   No SE with Vraylar.  11/24/21 urgent appointment appt noted: More and more depressed.   So anxious and doesn't want to be alone but can do so. No appetite. Hurts inside. Has had some fleeting suicidal thoughts but would not act on them.  Tolerating  meds. Has been consistent with Vraylar 1.5 mg every other day, fluoxetine 20 mg daily Plan: Increase Vraylar to 1.5 mg daily Change Prozac to Trintellix 10 mg daily. Discussed side effects of each continue Adderall to XR 20 mg AM  12/27/2021 appointment with the following noted: Not OK.  I feel less depressed but feels bat shit. Not sleeping well.  Extremely anxious. Off and on sleep. 3-4 hours of sleep.   Still having daily SI.  But also become obvious has so much to do.  Overwhelmed by tasks.   Needs anxiety meds to just function. Not more motivated.  Walked yesterday.   Feels afraid like in trouble but not irritable or angry. DC DT agitation Vraylar to 1.5 mg daily Change Prozac to Trintellix 10 mg daily. Hold Adderall to XR 20 mg AM Clonidine 0.1 1/2 tablet twice daily for 2 days and if needed for anxiety and sleep increase to 1 twice daily Ok temporary Ativan 1 mg 3 times daily as needed anxiety  01/05/22 appt noted: Off fluoxetine and  Trintellix.  Only on Ativan, trazodone and Adderall XR 20 plus added clonidine 0.1 mg BID Didn't think she needed to start Trintellix. Not taking Ativan.   Didn't like herself last week. Feels some better today. Wonders if the manic sx Not agitated.  Anxiety kind of calmed down.  A lot to be anxious about situationally.  $ stress. Concerns about downers with meds. Can't access normal personality. ? Lethargy and inability to talk as sE. Plan: Latuda 20-40 mg daily with food. Adderall to XR 20 mg AM Clonidine 0.1 1/2 tablet twice daily  reduce dose to be sure no SE Ok temporary Ativan 1 mg 3 times daily as needed anxiety  01/19/22 appt noted: Taking Latuda 20 mg daily.  Took 40 mg once and felt anxious and  SI Still depressed and not very reactive Anxiety mainly about the depression and fears of the future. She wants to revisit manic sx and thinks it was maybe bc taking delta 8 bc was taking a lot of it so still doesn't think she's classic  bipolar. She wants to only take Prozac bc thinks [place]  is perpetuating depression. Says the delta 8 was very psychaedelic.  When not taking it was not manic.  Sleeping ok again.  Plan: Per her request DC Latuda 20-40 mg daily with food. She wants to continue Prozac alone AMA  Adderall to XR 20 mg AM Clonidine 0.1 1/2 tablet twice daily  reduce dose to be sure no SE Ok temporary Ativan 1 mg 3 times daily as needed anxiety  01/23/2022 phone call complaining of increased anxiety since stopping Latuda.  She will try increasing clonidine.  01/26/2022 phone call not feeling well and wanted to restart the Vraylar.  However notes indicate that had made her agitated therefore she was encouraged to pick up samples of Rexulti 1 mg and start that instead.  02/06/2022 phone call: Stating she felt the Rexulti was helping with depression but she was not sleeping well and obsessing over things.  She was encouraged to increase Rexulti to 2 mg daily and increase trazodone for sleep.  02/09/2022 appointment with the following noted: This was an urgent work in appointment No sleep last night with trazodone 100 mg HS Nothing really better depression or anxiety. Ruminating negative anxious thoughts. Did not tolerate Rexulti because it was causing insomnia.  Does not think it helped depression.  Lacks emotion that she should have.  Lacks her usual personality.  Some hopeless thoughts.  Some death thoughts.  Some suicidal thoughts without plan or intent Plan: DC Rexulti and Prozac & DC trazodone Adderall to XR 20 mg AM Clonidine 0.1 1/tablet twice daily  reduce dose to be sure no SE Ok temporary Ativan 1 mg 3 times daily as needed anxiety Start Seroquel XR 150 mg nightly  03/02/2022 appointment: Langley Gauss called back a few days after starting Seroquel stating it was making her more anxious and more depressed.  This seemed unlikely as this medicine rarely ever causes anxiety.  She stopped the medication waited 3 days  and called back still had anxiety and depression but thought perhaps the anxiety was a little better.  She did not want to take the Seroquel. She knew about the option of Spravato and wanted to pursue that. Now questions whether to return to Seroquel while waiting to start Spravato bc feels just as bad without it and knows she didn't give it enough time to work.   MADRS 46  ECT-MADRS    Flowsheet Row Clinical Support from 08/06/2022 in [facility] from 07/04/2022 in [facility] from 05/21/2022 in [facility] Visit from 03/02/2022 in [facility]  MADRS Total Score _0 46      03/14/22 appt noted: Pt received Spravato 56 mg first dose today with some dissociative sx which were not severe.  She was anxious prior to the administration and felt better after receiving lorazepam 1 mg.  No NV, or HA. Wants to continue Spravato. Ongoing depression and desperate to feel better.  I'm not myself DT deprsssion which is most severe in recent history.  Anhedonia.  Low motivation.  Social avoidance. Continues to think all recent med trials are making her worse.  Sleep ok with Seroquel.  03/16/22 appt noted: Received Spravato 84 mg for the first time.  some dissociative sx which were not severe.  She was anxious prior to the administration and felt better after receiving lorazepam 1 mg.  No NV, or HA. Wants to continue Spravato.   Does not feel any better or different since the last appt.  Ongoing depression.  Ongoing depression and desperate to feel better.  I'm not myself DT deprsssion which is most severe in recent history.  Anhedonia.  Low motivation.  Social avoidance. Continues to think all recent med trials are making her worse.  Sleep ok with Seroquel.  Does not want to continue Seroquel for TRD.  03/20/2022 appointment noted: Came for Spravato administration today.  However blood pressure was  significantly elevated approximately 180/115.  She was given lorazepam 1 mg and clonidine 0.2 mg to try to get it down. She states she regretted stopping the Seroquel XR 300 mg tablets.  She now realizes it was helpful.  She did not sleep much at all last night.  She did not take the Adderall this morning. 2 to 3 hours after arrival blood pressure was still elevated at  170/110, 62 pulse.  For Spravato administration was canceled for today.  She admits to being anxious and depressed.  She is not suicidal.  She is highly motivated to receive the Spravato.  We discussed getting it tomorrow.  03/22/2022 appointment noted: Patient's blood pressure was never stable enough yesterday in order to get her in for Spravato administration.  She was encouraged to see her primary care doctor.  It is better today.  03/26/2022 appointment with the following noted: Blood pressure was better.  Saw her primary care doctor who started on oral Bystolic 2.5 mg daily. Received Spravato 84 mg today as scheduled.  Tolerated it well without nausea or vomiting headache or chest pain or palpitations.  Her blood pressure was borderline but manageable. She remains depressed and anxious.  She is ambivalent about the medicine and desperate to get to feel better.  Continues to have anhedonia and low energy and low motivation and reduced ability to do things.  Less social.  Not suicidal.  03/28/22 appt noted: Received Spravato 84 mg today as scheduled.  Tolerated it well without nausea or vomiting headache or chest pain or palpitations.  Her blood pressure was borderline but manageable. Has not seen any improvement so far.  Tolerating Seroquel.  Inconsistent with Bystolic and BP has been borderline high. Still depressed and anxious and anhedonia.  Low motivation, energy, productivity. Taking quetiapine and tolerating XR 300 mg nightly.  04/04/22 appt noted: Received Spravato 84 mg today as scheduled.  Tolerated it well without nausea  or vomiting headache or chest pain or palpitations.  Her blood pressure was borderline but manageable. Has not seen any improvement so far.  Tolerating Seroquel.   She still tends to think that the medications are making her worse.  She has said this about each of the recent psychiatric medicines including Seroquel.  However her husband thinks she is improved.  She also admits there is some improvement in productivity.  She still feels highly anxious.  She still does not enjoy things as normal.  She still feels desperate to improve as soon as possible. Has been taking Seroquel XR since 03/20/2022  04/10/22 appt noted: Received Spravato 84 mg today as scheduled.  Tolerated it well without nausea or vomiting headache or chest pain or palpitations.  Her blood pressure was borderline but manageable. Has not seen any improvement so far.  Tolerating Seroquel.  Doesn't like Seroquel bc she thinks it flattens here. 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  Tolerated it well without nausea or vomiting headache or  chest pain or palpitations.  Her blood pressure was borderline but manageable. Has not seen any improvement so far.  Tolerating Seroquel.  Doesn't like Seroquel bc she thinks it flattens her. Received Spravato 84 mg today as scheduled.  Tolerated it well without nausea or vomiting headache or chest pain or palpitations.  Her blood pressure was borderline but manageable. Has not seen any improvement so far.  Tolerating Seroquel.  Doesn't like Seroquel bc she thinks it flattens here.  We discussed her ambivalence about it. She is starting Auvelity and has tolerated it the last 2 days without side effect.  She still does not feel like herself and feels flat and not enjoying things with suppressed expressed emotion  04/17/2022  appointment with the following noted: Received Spravato 84 mg today as scheduled.  Tolerated it well without nausea or vomiting headache or chest pain or palpitations.  Her blood pressure was borderline but manageable. Has not seen any improvement so far.  Tolerating Seroquel.  Doesn't like Seroquel bc she thinks it flattens her. She has been tolerating the Auvelity 1 in the morning without side effects for about a week.  She has not noticed significant improvement so far.  She still feels depressed and flat and not herself.  Other people notice that she is flat emotionally.  She is not suicidal.  She does feel discouraged that she is not getting better yet.  04/19/2022 appointment noted: Has increased Auvelity to 1 twice daily for 2 days, continues quetiapine XR 300 mg nightly, clonidine 0.3 mg twice daily, lorazepam 1 mg twice daily for anxiety and Adderall XR 20 mg in the morning. No obious SE but she still thinks quetiapine XR is making her feel down.  But not sedated Received Spravato 84 mg today as scheduled.  Tolerated it well without nausea or vomiting headache or chest pain or palpitations.  Her blood pressure was borderline but manageable. She still feels quite anxious and feels it necessary to take both the clonidine and lorazepam twice a day to manage her anxiety.  She has been consistently down and flat and not herself until yesterday afternoon she noted an improvement in mood and feeling much more like herself with her normal personality reemerging.  She was quite depressed in the morning with very dark negative thoughts.  She did not have those dark negative thoughts this morning.  She had a lot of questions about medication and when she was expecting to be improved and why she has not shown improvement up to now.  04/23/22 appt noted: Has increased Auvelity to 1 twice daily for 1 week, continues quetiapine XR 300 mg nightly, clonidine 0.3 mg twice daily, lorazepam 1 mg twice daily for  anxiety and Adderall XR 20 mg in the morning. No obious SE but she still thinks quetiapine XR is making her feel down.  But not sedated Received Spravato 84 mg today as scheduled.  Tolerated it well without nausea or vomiting headache or chest pain or palpitations.  She is still depressed but admits better function and is able to enjoy social interactions. Tolerating meds.  Would like to feel better for sure. Not herself.  Flat. Plan increase Auvelity to 1 tab BID as planned and reduce Quetiapine to 1/2 of ER 300 mg  bc NR for depression.  04/25/2022 appointment with the following noted: clonidine 0.3 mg twice daily, lorazepam 1 mg twice daily for anxiety and Adderall XR 20 mg in the morning. Seroquel XR 300 HS No obious SE  but she still thinks quetiapine XR is making her feel down.  But not sedated Received Spravato 84 mg today as scheduled.  Tolerated it well without nausea or vomiting headache or chest pain or palpitations.  Called yesterday with more anxiety.  Had increased Auvelity for 1 day and reduced Seroquel XR for 1 day.  Felt restless and fearful  05/01/2022 appointment noted: clonidine 0.3 mg twice daily, lorazepam 1 mg twice daily for anxiety and Adderall XR 20 mg in the morning. Seroquel XR 150 HS, Auvelity 1 BID Received Spravato 84 mg today as scheduled.  Tolerated it well without nausea or vomiting headache or chest pain or palpitations.  Nurse has noted patient has called multiple times sometimes asking the same question repeatedly.  It is unclear whether she is truly forgetful or is just anxious seeking reassurance. Patient acknowledges ongoing depression as well as some anxiety but states she has felt a little better in the last couple of days.  She has reduced the Seroquel to 150 mg at night and has increased Auvelity to 1 twice daily but only for 1 day.  So far she seems to be tolerating it.  05/03/22 appt noted: clonidine 0.2 mg twice daily, lorazepam 1 mg twice daily for  anxiety and Adderall XR 20 mg in the morning. Seroquel XR 150 HS, Auvelity 1 BID BP high this am about 170/100 and received extra clonidine 0.2 mg and came to receive Spravato.  Not dizzy, no SOB, nor CP but BP is still high Could not receive Spravato today bc BP high and pulse low at 30 ppm. Still depressed and anxious. Plan: continue trial Auvelity with Spravato She needs to get BP and pulse managed  05/08/22 TC: RTC  H Michael NA and mailbox full.  Could not leave message.  Pt  -  talked to she and H on speaker. H worried over wife.  Vacant stare.  Slurs words at times.  Not smiling. Reduced enjoyment.  Depression.  Withdrawn from usual activities.  Some irritability.  Anxious. Disc her concerns meds are making her worse.  Extensive discussion about her treatment resistant status.  There is a consistent pattern of not taking the medicines long enough to get benefit because she believes the meds are making her worse.  However the symptoms she describes as side effects are exactly the same symptoms that she had prior to taking the medication RX for  the depression.  So it is not clear that these are actual side effects. This is true about the 2 most recent meds including Seroquel and Auvelity.  Recommend psychiatric consultation in hopes of improving her comfort level with taking prescribed medications for a sufficient length of time to provide benefit. Extensive discussion about ECT is the treatment of choice for treatment resistant depression.  Spravato may work if she can comply with consistency.  There are medication options but they take longer to work.   Plan:  Reduce clonidine to 0.1 mg BID DT bradycardia.  Talk with PCP about BP and low pulse problems which are interfering with her consistent compliance with Spravato.   Limit lorazepam to 3 -4  mg daily max. Excess use is the cause of slurring speech.  She must stop excess use or will have to stop the med. Stop Auvelity per her request.   But she has only been on the full dose for a little over a week and clearly has not had time to get benefit from it.  She thinks maybe  it is making her more anxious. Reduce Seroquel from 150XR to 50 -100 mg at night IR.  She couldn't sleep when stopped it completely. Will not start new antidepressant until her SE issues are resolved or not. Get second psych opinion from Yehuda Budd MD or another psychiatrist.  H's sister is therapist in [place], [facility], [facility]  05/16/2022 appointment with the following noted: Received Spravato 84 mg today as scheduled.  Tolerated it well without nausea or vomiting headache or chest pain or palpitations.  She stopped Auvelity as discussed last week. On her own, without physician input, she restarted Wellbutrin XL 450 mg every morning today.  She had taken it in the past.  She feels jittery and anxious. She feels less depressed than she did last week.  But she is still depressed without her usual range of affect.  She still is less social and less motivated than normal. Her primary care doctor increased the dose of losartan Plan: Stop Seroquel Reduce Wellbutrin XL to 300 mg every morning.  Starting the dose at 450 every morning is likely causing side effects of jitteriness and it should not be started at that have a dosage. Recommend she not change meds on her own without MDM put  05/23/2022 appointment with the following noted: Received Spravato 84 mg today as scheduled.  Tolerated it well without nausea or vomiting headache or chest pain or palpitations.  Has not dropped seroquel XR 300 mg 1/2 tablet nightly bc couldn't sleep without it. Has not tried lower dose quetiapine 50 mg HS Still feels depressed.   BP is better managed so far, just saw PCP.  BP is better today and infact is low today. Dropped clonidine as directed from 0.3 mg BID bc inadequate control of BP to 0.2 mg BID.  However she wants to increase it back to 0.3 mg twice daily because she  feels it helped her anxiety better.  Wonders about increasing Wellbutrin for depression.  However she has only been on 300 mg a day for a week.  She was on 450 mg daily in the past.  06/06/22 appt noted: Received Spravato 84 mg today as scheduled.  Tolerated it well without nausea or vomiting headache or chest pain or palpitations.  She is still depressed and anxious.  She wants to try to stop the Seroquel but cannot sleep without some of it.  She is taking lorazepam 1 mg 4 times daily and still having a lot of anxiety.  She wants to increase clonidine back to 0.3 mg twice daily.  She hopes for more improvement She recently went for a second psychiatric opinion as suggested the results of that are pending.  06/11/22 appt noted: Received Spravato 84 mg today as scheduled.  Tolerated it well without nausea or vomiting headache or chest pain or palpitations.  She is still depressed and anxious. Without much change.  Still hopeless, anhedonia, reduced inteterest and motivation.  Tolerating meds. Disc concerns Spravato is not hleping much. Plan: stop Seroquel and start olanzapine 10 mg HS for TRD and anxiety.  06/13/2022 appointment noted: Received Spravato 84 mg today as scheduled.  Tolerated it well without nausea or vomiting headache or chest pain or palpitations.  She is still depressed and anxious. Without much change.  Still hopeless, anhedonia, reduced inteterest and motivation.  Tolerating meds. Disc concerns Spravato is not helping much as hoped but is improving a bit in the last week. Tolerating meds. Continues Wellbutrin XL 450 AM, tolerating recently started  olanzapine  10 mg HS. Sleep is good.   Pending appt with [place] consult.  06/18/22 appt noted: Received Spravato 84 mg today as scheduled.  Tolerated it well without nausea or vomiting headache or chest pain or palpitations.  Tolerating meds. Continues Wellbutrin XL 450 AM, tolerating recently started olanzapine  10 mg HS. Continues  Adderall XR 20 amd and has tried to reduce lorazepam to 12m TID Sleep is good.   [place] [facility]. Depression is a little bit better in the last week with a little improvement in emotional expression and interest.  She is pushing herself to be more active.  Her daughter thought she was a little better than she has been.  However she is still depressed and still not her normal self with anhedonia and reduced emotional expressiveness.  06/20/22 appt noted: Received Spravato 84 mg today as scheduled.  Tolerated it well without nausea or vomiting headache or chest pain or palpitations.  Tolerating meds with a little sleepiness. Continues Wellbutrin XL 450 AM, tolerating recently started olanzapine  10 mg HS. Continues Adderall XR 20 amd and has tried to reduce lorazepam to 126mTID Sleep is good.   Mood is improving.  Better funciton.  Anxiety is better with olanzapine. Still not herself and depression not gone with some anhedonia and social avoidance and feeling overwhelmed.  8/14 2023 received Spravato 84 mg 06/27/2022 received Spravato Spravato 84 mg 07/02/2022 received Spravato 84 mg 07/04/2022 received Spravato 84 mg  07/09/2022 appointment noted: Received Spravato 84 mg today as scheduled.  Tolerated it well without nausea or vomiting headache or chest pain or palpitations.  Expected dissociation and feels less depressed with resolution of negative emotions immediately after Spravato and then depression, anxiety creep back in. Continues meds Adderall XR 20 mg every morning, Wellbutrin XL 450 every morning, clonidine 0.1 mg twice daily, lorazepam 1 mg every 6 hours as needed, olanzapine increased from 7.5 to 10 mg nightly on  Tolerating meds.  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  She does note some increased appetite and is somewhat concerned about that but  has not gained significant amounts of weight. [place] but she knows it can be appealed.  However because she is improving with Spravato plus the other medications now she wants to continue the current treatment plan.  07/18/22 appt noted: Continues meds Adderall XR 20 mg every morning, Wellbutrin XL 450 every morning, clonidine 0.1 mg twice daily, lorazepam 1 mg every 6 hours as needed, olanzapine increased from 7.5 to 10 mg nightly on 07/04/2022. Received Spravato 84 mg today as scheduled.  Tolerated it well without nausea or vomiting headache or chest pain or palpitations.  Expected dissociation and feels less depressed with resolution of negative emotions immediately after Spravato and then depression, anxiety creep back in. Continues meds Adderall XR 20 mg every morning, Wellbutrin XL 450 every morning, clonidine 0.1 mg twice daily, lorazepam 1 mg every 6 hours as needed, olanzapine increased from 7.5 to 10 mg nightly on  Tolerating meds.  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  She does note some increased appetite and is somewhat concerned about that but has not gained significant amounts of weight. She has had the  [place] consultation which was initially denied but she knows it can be appealed. She continues to have chronic ambivalence about psychiatric medicines and initially tends to blame her depressive symptoms such as decreased concentration and feeling flat on what ever medicine she currently is taking even though she had the same symptoms before the current medicines were started.  Then after discussion she does admit that her depressive symptoms are improved since adding olanzapine but still has those residual symptoms noted.  07/23/22 received Spravato 84 mg   07/30/2022 appointment noted: Received Spravato 84 mg today  as scheduled.  Tolerated it well without nausea or vomiting headache or chest pain or palpitations.  Expected dissociation and feels less depressed with resolution of negative emotions immediately after Spravato and then depression, anxiety creep back in. Continues meds Adderall XR 20 mg every morning, Wellbutrin XL 450 every morning, clonidine 0.1 mg twice daily, lorazepam 1 mg every 6 hours as needed, olanzapine increased from 7.5 to 10 mg nightly on  She has been inconsistent with olanzapine because she continues to be ambivalent about the medications in general and thinks that perhaps the 10 mg is making her feel blunted.  She continues to feel some depression.  She had a good day this week and but still feels somewhat depressed and persistently anxious. Plan: be consistent with olanzapine 10 mg HS for TRD and longer trial for potential benefit for anxiety.  Has not taken it consistently.  08/06/2022 appointment noted: Received Spravato 84 mg today as scheduled.  Tolerated it well without nausea or vomiting headache or chest pain or palpitations.  Expected dissociation and feels less depressed with resolution of negative emotions immediately after Spravato and then depression, anxiety creep back in. Continues meds Adderall XR 20 mg every morning, Wellbutrin XL 450 every morning, clonidine 0.1 mg twice daily, lorazepam 1 mg every 6 hours as needed, olanzapine i 10 mg nightly  She continues to feel depressed but is about 50% better with Spravato.  She is still not herself.  She still has anhedonia.  She still is not her able to engage socially in the typical ways.  She is not jovial and outgoing like normal.  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  She continues to feel that her personality is dampened down.  There is a question about whether it is related to depression or  medication. Plan: continue olanzapine 10 for longer trial for TRD and severe anxiety.  08/13/22 appt noted:  Received Spravato 84 mg today as scheduled.  Tolerated it well without nausea or vomiting headache or chest pain or palpitations.  Expected dissociation and feels less depressed with resolution of negative emotions immediately after Spravato and then depression, anxiety creep back in. Continues meds Adderall XR 20 mg every morning, Wellbutrin XL 450 every morning, clonidine 0.1 mg twice daily, lorazepam 1 mg every 6 hours as needed, olanzapine i 10 mg nightly  She still does not feel herself.  Still struggles with depression and low motivation and reduced social engagement and reduced interest and reduced emotional expression.  She is somewhat better with the medicines plus Spravato.  She still believes the Spravato makes her blunted and is not sure how much it helps her anxiety.  She can have good days when her family is around and she is engaged.  She still wants to stop the olanzapine. She  has apparently continued to take the trazodone despite having been told to stop it when she started olanzapine.  She feels like she needs the trazodone. Plan: DC olanzapine and Start nortriptyline 25 mg nightly and build up to 75 mg nightly and then check blood level.    08/27/2022 appointment noted: Received Spravato 84 mg today as scheduled.  Tolerated it well without nausea or vomiting headache or chest pain or palpitations.  Expected dissociation and feels less depressed with resolution of negative emotions immediately after Spravato and then depression, anxiety creep back in. Continues meds Adderall XR 20 mg every morning, Wellbutrin XL 450 every morning, clonidine 0.1 mg twice daily, lorazepam 1 mg every 6 hours as needed. Stopped olanzapine and started nortriptyline which she has taken for about a week is 75 mg nightly. So far she is tolerating the nortriptyline well with the exception of some dry  mouth and constipation which she is working to manage.  She does not feel substantially better better or different off the olanzapine.  No change in her sleep which is good.  Main concern currently in addition to the residual depression is anxiety which is somewhat situational with pending arch show.  She is worrying about it more than normal.  Says she is having to take lorazepam twice a day where she had been able to keep reduce it prior to this.  She still does not feel like herself with residual depression with less social interest and less of her usual buoyancy in personality.  She is flatter than normal.  Overall she still feels that the Spravato has been helpful at reducing the severity of the depression.  She is not suicidal. She has not heard anything about the [place] appeal as of yet.  09/05/2022 appointment noted: Received Spravato 84 mg today as scheduled.  Tolerated it well without nausea or vomiting headache or chest pain or palpitations.  Expected dissociation and feels less depressed with resolution of negative emotions immediately after Spravato and then depression, anxiety creep back in. Continues meds Adderall XR 20 mg every morning, Wellbutrin XL 450 every morning, clonidine 0.1 mg twice daily, lorazepam 1 mg every 6 hours as needed. Stopped olanzapine and started nortriptyline which she has taken for about 2 week is 75 mg nightly. Initially blood pressure was a little high causing delay in starting Spravato.  She admitted to feeling a little wound up.  She still experiences a little increase in depression if she goes longer than a week in between doses of Spravato.  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  09/10/22 appt noted: Received Spravato 84 mg today as scheduled.  Tolerated it well without nausea or vomiting headache or chest pain or palpitations.  Expected dissociation  gradually resolved over the 2 hour observation period. She feels 50% less depressed with Spravto and wants to continue it.   Continues meds Adderall XR 20 mg every morning, Wellbutrin XL 450 every morning, clonidine 0.1 mg twice daily, lorazepam 1 mg every 6 hours as needed. Has started nortriptyline 75 mg nightly for about 3 weeks. Has not seen a significant difference with the addition of nortriptyline.  Tolerating it pretty well. She continues to have some degree of anhedonia and significant depression and anxiety.  Her daughters noticed that she is more needy and calls more frequently.  She acknowledges this as well.  She is  clearly still not herself. Plan: pramipexole off label and RX 0.25 mg BID  09/17/2022 appointment noted: Received Spravato 84 mg today as scheduled.  Tolerated it well without nausea or vomiting headache or chest pain or palpitations.  Expected dissociation gradually resolved over the 2 hour observation period. She feels 50% less depressed with Spravto and wants to continue it.   Continues meds Adderall XR 20 mg every morning, Wellbutrin XL 450 every morning, clonidine 0.1 mg twice daily, lorazepam 1 mg every 6 hours as needed. Has started nortriptyline 75 mg nightly for about 3 weeks and DT level 176, reduced to 50 mg HS early November. Still the same sx as noted last visit.  Tolerating meds.   Compliant.  Still depressed and family notices.  Has been able to participate in family interactions.  Some post-show let down and has to do detailed work which is hard for her bc ADD.  Sleep and eating well.  Energy OK but not great.  No SI.  Not cried in a year or so.  Clearly less depressed and hopeless than before the Spravato.  09/24/22 appt noted: Received Spravato 84 mg today as scheduled.  Tolerated it well without nausea or vomiting headache or chest pain or palpitations.  Expected dissociation gradually resolved over the 2 hour observation period. She feels 50% less  depressed with Spravto and wants to continue it.   Continues meds Adderall XR 20 mg every morning, Wellbutrin XL 450 every morning, clonidine 0.1 mg twice daily, lorazepam 1 mg every 6 hours as needed. Has started nortriptyline 75 mg nightly for about 3 weeks and DT level 176, reduced to 50 mg HS early November. Still the same sx as noted last visit.  Tolerating meds.   Compliant.  Still depressed and family notices.  Has been able to participate in family interactions.  Some post-show let down and has to do detailed work which is hard for her bc ADD.  Sleep and eating well.  Energy OK but not great.  No SI.  Not cried in a year or so.  Clearly less depressed and hopeless than before the Spravato. Is not making further progress generally.  Stuck with moderate depression  10/02/22 appt noted: Received Spravato 84 mg today as scheduled.  Tolerated it well without nausea or vomiting headache or chest pain or palpitations.  Expected dissociation gradually resolved over the 2 hour observation period. She feels 50% less depressed with Spravto and wants to continue it.   Continues meds Adderall XR 20 mg every morning, Wellbutrin XL 450 every morning, clonidine 0.1 mg twice daily, lorazepam 1 mg every 6 hours as needed. Has started nortriptyline 75 mg nightly for about 3 weeks and DT level 176, reduced to 50 mg HS early November. Still the same sx as noted last visit.  Tolerating meds.   Compliant.  Still depressed and family notices.  Has been able to participate in family interactions.  Some post-show let down and has to do detailed work which is hard for her bc ADD.  Sleep and eating well.  Energy OK but not great.  No SI.  Not cried in a year or so.  Clearly less depressed and hopeless than before the Spravato. Is not making further progress generally.  Stuck with moderate depression.  Is able to function pretty normally.  ECT-MADRS    Flowsheet Row Clinical Support from 08/06/2022 in [place] from 07/04/2022 in [place] from 05/21/2022 in  [facility] Office Visit from 03/02/2022 in [facility]  MADRS Total Score _0 46        Past Psychiatric Medication Trials: fluoxetine, duloxetine, Viibryd, lamotrigine, Pristiq, sertraline, citalopram,  Trintellix anxious and SI Wellbutrin XL 450 [place] 1 dose  Adderall, Adderall XR, Vyvanse, Ritalin, Strattera low dose NR Lorazepam Trazodone  Depakote,  lamotrigine cog complaints Lithium remotely Abilify 7.5  Vraylar 1.5 mg daily agitation and insomnia Rexulti insomnia Latuda 40 one dose, CO anxious and SI Seroquel XR 300 Olanzapine 10  At visit November 12, 2019. We discussed Patient developed an increasingly severe alcohol dependence problem since her last visit in January.  She went to [facility] and has had no alcohol since then except 1 day.  She never abused stimulants but they took her off the stimulants at [facility].  Her ADD was markedly worse.  The Wellbutrin did not help the ADD.   D history lamotrigine rash at 66 yo  Review of Systems:  Review of Systems  Constitutional:  Positive for fatigue.  Cardiovascular:  Negative for palpitations.  Musculoskeletal:  Positive for arthralgias and back pain. Negative for joint swelling.       SP hip surgery October 2020  Neurological:  Negative for dizziness and tremors.  Psychiatric/Behavioral:  Positive for decreased concentration and dysphoric mood. Negative for agitation, behavioral problems, confusion, hallucinations, self-injury, sleep disturbance and suicidal ideas. The patient is nervous/anxious. The patient is not hyperactive.     Medications: I have reviewed the patient's current medications.  Current Outpatient Medications  Medication Sig Dispense Refill   amLODipine (NORVASC) 2.5 MG tablet Take 2.5 mg by mouth daily.      amphetamine-dextroamphetamine (ADDERALL XR) 20 MG 24 hr capsule Take 1 capsule (20 mg total) by mouth every morning. 30 capsule 0   amphetamine-dextroamphetamine (ADDERALL XR) 20 MG 24 hr capsule Take 1 capsule (20 mg total) by mouth every morning. 30 capsule 0   buPROPion (WELLBUTRIN XL) 150 MG 24 hr tablet TAKE 3 TABLETS BY MOUTH DAILY 270 tablet 1   cloNIDine (CATAPRES) 0.2 MG tablet Take 1 tablet (0.2 mg total) by mouth 2 (two) times daily. 60 tablet 1   Dextromethorphan HBr 15 MG CAPS Take 1 capsule (15 mg total) by mouth every 12 (twelve) hours. (Patient not taking: Reported on 09/26/2022) 60 capsule 0   iron polysaccharides (NIFEREX) 150 MG capsule TAKE 1 CAPSULE BY MOUTH EVERY DAY 90 capsule 1   LORazepam (ATIVAN) 1 MG tablet Take 1 tablet (1 mg total) by mouth every 8 (eight) hours as needed. for anxiety 90 tablet 0   losartan (COZAAR) 50 MG tablet Take 50 mg by mouth daily.     nebivolol (BYSTOLIC) 2.5 MG tablet Take 2.5 mg by mouth daily.     nortriptyline (PAMELOR) 25 MG capsule TAKE 3 CAPSULES (75 MG TOTAL) BY MOUTH AT BEDTIME. 270 capsule 1   pramipexole (MIRAPEX) 0.5 MG tablet Take 1 tablet (0.5 mg total) by mouth 2 (two) times daily. 60 tablet 0   SPRAVATO, 84 MG DOSE, 28 MG/DEVICE SOPK USE 3 SPRAYS IN EACH NOSTRIL TWICE A WEEK 3 each 3   traZODone (DESYREL) 50 MG tablet 1-2 TABLETS NIGHTLY AS NEEDED FOR SLEEP 60 tablet 1   No current facility-administered medications for this visit.    Medication Side Effects: None  Allergies:  Allergies  Allergen Reactions   Metronidazole Shortness Of Breath and Other (See Comments)    Heart pounding   Ferrlecit [  Na Ferric Gluc Cplx In Sucrose] Other (See Comments)    Infusion reaction 05/12/2019    Past Medical History:  Diagnosis Date   ADHD    Anemia    Anxiety    Arthritis    Depression    Heart murmur    i went to see a cardiologit slast eyar  and i had zero plaque,    PONV (postoperative nausea and vomiting)    Recovering  alcoholic in remission ([place])     Family History  Problem Relation Age of Onset   Atrial fibrillation Mother    CAD Father     Past Medical History, Surgical history, Social history, and Family history were reviewed and updated as appropriate.   Please see review of systems for further details on the patient's review from today.   Objective:   Physical Exam:  There were no vitals taken for this visit.  Physical Exam Constitutional:      General: She is not in acute distress. Neurological:     Mental Status: She is alert and oriented to person, place, and time.     Coordination: Coordination normal.     Gait: Gait normal.  Psychiatric:        Attention and Perception: Attention and perception normal.        Mood and Affect: Mood is anxious and depressed. Affect is not labile, angry or tearful.        Speech: Speech is not rapid and pressured or slurred.        Behavior: Behavior is not slowed.        Thought Content: Thought content is not paranoid or delusional. Thought content does not include homicidal or suicidal ideation. Thought content does not include suicidal plan.        Cognition and Memory: Cognition normal. Memory is not impaired. She does not exhibit impaired recent memory.        Judgment: Judgment normal.     Comments: Insight intact. No auditory or visual hallucinations. No delusions.  Depression ongoing residual without much change Affect still depressed but less blunted than it was before Spravato.  Still blunted No Sui intent plan      Lab Review:     Component Value Date/Time   NA 137 01/12/2021 1430   NA 140 11/18/2018 1544   K 3.8 01/12/2021 1430   CL 108 01/12/2021 1430   CO2 22 01/12/2021 1430   GLUCOSE 94 01/12/2021 1430   BUN 14 01/12/2021 1430   BUN 20 11/18/2018 1544   CREATININE 0.82 01/12/2021 1430   CALCIUM 8.9 01/12/2021 1430   PROT 6.6 01/12/2021 1430   ALBUMIN 3.9 01/12/2021 1430   AST 12 (L) 01/12/2021 1430   ALT 11  01/12/2021 1430   ALKPHOS 46 01/12/2021 1430   BILITOT 0.5 01/12/2021 1430   GFRNONAA >60 01/12/2021 1430   GFRAA >60 09/02/2019 0249   GFRAA >60 01/27/2019 0811       Component Value Date/Time   WBC 4.5 01/12/2021 1430   RBC 4.32 01/12/2021 1430   HGB 12.8 01/12/2021 1430   HGB 12.9 07/17/2019 0953   HCT 38.5 01/12/2021 1430   HCT 21.9 (L) 12/25/2018 1221   PLT 272 01/12/2021 1430   PLT 286 07/17/2019 0953   MCV 89.1 01/12/2021 1430   MCH 29.6 01/12/2021 1430   MCHC 33.2 01/12/2021 1430   RDW 12.4 01/12/2021 1430   LYMPHSABS 1.4 01/12/2021 1430   MONOABS 0.4 01/12/2021 1430   EOSABS  0.0 01/12/2021 1430   BASOSABS 0.0 01/12/2021 1430    No results found for: "POCLITH", "LITHIUM"   No results found for: "PHENYTOIN", "PHENOBARB", "VALPROATE", "CBMZ"   .res Assessment: Plan:    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  Have  discussed some of her recent abnormal behaviors leading to this depressive episode getting worse which she says were associated with heavy use of delta 8 and not a manic episode.  She realizes now that that was not good for her.  She stopped all use of other drugs including those available over-the-counter such as delta 8 or any other THC related products.  She is no longer having any of those types of behaviors and instead is depressed.  She remains depressed with lack of interest and lack of feeling for things that normally she would have feelings about.  She has low motivation and energy.  She is difficulty making decisions.  She is sad and down.  She is less productive than usual.  Her concentration is poor.  She has high degree of anxiety as well.  Her personality is more flat than usual. She has a very  negative self-image and low self-esteem.  These are all uncharacteristic for her However all of these symptoms are partially improved through Spravato and the switch from Seroquel to olanzapine.  She is approximately 50% better.  Patient was administered Spravato 84 mg intranasally today.  The patient experienced the typical dissociation which gradually resolved over the 2-hour period of observation.  There were no complications.  Specifically the patient did not have nausea or vomiting or headache.  Blood pressures remained within normal ranges at the 40-minute and 2-hour follow-up intervals.  By the time the 2-hour observation period was met the patient was alert and oriented and able to exit without assistance.  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  See nursing note for further details.She wants to continue Spravato. We discussed discussed the side effects in detail as well as the protocol required to receive Spravato.   Failed multiple antidepressants.  Many of them were not actual failures but intolerances and it is unclear whether some of that was more connected with anxiety than true side effects.  1 example is the [place].  In general she does not want to try anything but an antidepressant but has failed all major categories of antidepressants except TCAs and MAO inhibitors which have not been tried.  Objectively nor subjectively any different since stopping olanzapine.  In its place the next logical antidepressant would be to use a tricyclic antidepressant as she has not taken 1 of these medications before. Started nortriptyline 75 mg nightly.  Serum level 176.  Reduced to 50 mg HS  Discussed side effects in detail.  Needs more time to help. Extensive discussion previously about her ambivalence about meds and missatributing sx of depression as SE of meds.    continue Wellbutrin XL 450 mg AM  Off  label augmentation DM at a lower dose of 15 mg BID-defer Instead pramipexole augmentation 0.125 mg BID then 0.25 mg BID  Started Spravato 84 mg twice weekly on 03/16/2022.  Now on weekly administration  Adderall  XR 20 mg AM  Ok temporary Ativan 1 mg  3-4 times daily as needed anxiety but try to cut it back. Is not ideal to use benzodiazepine with stimulant but because of the severity of her symptoms it has been necessary.  Laura to eventually eliminate the benzodiazepine.  Expected as her depression improves her anxiety will improve as well.  However lately her anxiety has been unmanageable.  We will expect that to improve as the depression improves.  She has headed insturctions to reduce this.  Continue clonidine 0.2 mg BID off label for anxiety and helps BP partially. BP is better controlled but not consistent.  Consider increasing amlodipine.  Also on losartan 50  Discussed potential benefits, risks, and side effects of stimulants with patient to include increased heart rate, palpitations, insomnia, increased anxiety, increased irritability, or decreased appetite.  Instructed patient to contact office if experiencing any significant tolerability issues. She wants to return to usual dose of Adderall for ADD bc of mor poor cognitive function with reduction.  Also discussed that depression will impair cognitive function.  [facility] consultation was initially denied .  She says she called [facility] about this but they have not responded.    Extensive discussion about her chronic ambivalence about psychiatric medicines and tendency to blame medicine for the symptoms of depression that she had prior to even starting the medications.  After discussing this at length she was able to acknowledge that that is factual.  She does not appear to be having any significant side effects with the current medications.  However so far not much benefit with nortriptyline despite adequate level.  Has Maintained  sobriety  FU with Spravato weekly   Lynder Parents, MD, DFAPA     Please see After Visit Summary for patient specific instructions.  No future appointments.             No orders of the defined types were placed in this encounter.      -------------------------------

## 2022-10-12 ENCOUNTER — Other Ambulatory Visit: Payer: Self-pay

## 2022-10-12 DIAGNOSIS — F339 Major depressive disorder, recurrent, unspecified: Secondary | ICD-10-CM

## 2022-10-12 MED ORDER — PRAMIPEXOLE DIHYDROCHLORIDE 0.5 MG PO TABS: 0.5000 mg | ORAL_TABLET | Freq: Two times a day (BID) | ORAL | 0 refills | Status: DC

## 2022-10-15 ENCOUNTER — Ambulatory Visit: Payer: 59

## 2022-10-15 ENCOUNTER — Ambulatory Visit (INDEPENDENT_AMBULATORY_CARE_PROVIDER_SITE_OTHER): Payer: 59 | Admitting: Psychiatry

## 2022-10-15 VITALS — BP 116/90 | HR 54

## 2022-10-15 DIAGNOSIS — F411 Generalized anxiety disorder: Secondary | ICD-10-CM

## 2022-10-15 DIAGNOSIS — F900 Attention-deficit hyperactivity disorder, predominantly inattentive type: Secondary | ICD-10-CM | POA: Diagnosis not present

## 2022-10-15 DIAGNOSIS — F339 Major depressive disorder, recurrent, unspecified: Secondary | ICD-10-CM

## 2022-10-15 DIAGNOSIS — F5105 Insomnia due to other mental disorder: Secondary | ICD-10-CM | POA: Diagnosis not present

## 2022-10-15 NOTE — Progress Notes (Signed)
Laura Chang [id number] 18-Nov-1955 66 y.o.  Subjective:   Patient ID:  Laura Chang is a 66 y.o. (DOB 09/07/1956) female.  Chief Complaint:  No chief complaint on file.    HPI Laura Chang Laura Chang presents to the office today for follow-up of depression and anxiety and ADD.  seen November 12, 2019.  Melted down in 2020.  Went to [employer] in July.  No withdrawal.  1 drink since then.  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 She was on no stimulant and no SSRI but was taking Strattera and Wellbutrin.  The following changes were made. Stop Strattera. OK restart stimulant bc severe ADD Restart Adderall 1 daily for a few days and if tolerated then restart 1 twice daily. If not tolerated reduce the dosage if needed. May need to stop Wellbutrin if not tolerating the stimulant.  Yes.  DC Wellbutrin Restart Prozac 20 mg daily.  February 2021 appointment with the following noted: Completed grant proposal.  Couldn't doit without Adderall.  Sold a bunch of work.   Adderall XR lasts about 3 pm.  Strength seems about right.  BP been OK.  Not jittery.   Stopped Wellbutrin but had no SE. Mood drastically better with grant proposal and back on fluoxetine.  Less depressed and lethargic.  No anxiety.  Cut back on coffee. Started back with devotions and stronger faith. Plan: Continue Prozac 20 mg daily. May have to increase the dose at some point in the future given that she usually was taking higher dosages but she is getting good response at this time. Restart Wellbutrin off label for ADD since can't get 2 ADDERALL daily. 150 mg daily then 300 mg daily. She can adjust the dose between 150 mg and 300 mg daily to get the optimal effect.   05/11/2020 appointment with the following noted: Has been inconsistent with Prozac and Wellbutrin. Not sure of the effect of Wellbutrin. Biggest deterrent in work is anxiety.  Some of the work is conceptual and difficult at times.   Can feel she's not up to a project at times.  Overall is OK but would like a steadier benefit from stimulant.  Exhausted from managing concentration and keeping up with things from the day.  Loses things.  Not good keeping up with schedule. Overall productive and emotionally OK. Can feel Adderall wear off. Mood is better in summer and worse in the winter.   F died in 10/18/2023 and that is a loss. No SE Wellbutrin. Still attends AA meetings.  Real benefit from [place] last year. Recognizes effect of anemia on ADD and mood.  Had iron infusions last winter. Plan:  Wellbutrin off label for ADD since can't get 2 ADDERALL daily. 150 mg daily then 300 mg daily.  01/24/2021 appointment with following noted: Doing a program called [employer] mindfulness app since Xmas.  CBT app helped the depression.  App helped her focus better.  Lost sign weight. Writing a lot. Before Xmas felt depressed and started negative thinking worse, self denigrating. Not drinking. More isolated.   Recognizes mo is narcissist.    Didn't tell anyone she was born until 3 mos later.  M aloof and uninterested in pt.  Lied about her birthday.  Mo lack of affection even with pt's kids. Going to [place] for a year and it helped her to quit drinking. Also misses kids being gone with a hole also. Plan: No med changes  05/04/2021 appointment with the following  noted: Therapist [name] thinks she's manic. Lost weight to 144#.   States she is still sleeping okay.  Admits she is hyper and recognizes that she is likely manic.  She feels great, euphoric with an increased sense of spiritual connectedness to God.  She has racing thoughts and talks fast and talks a lot and this is noted by her husband.  He thinks she is a bit hyper.  She has been able to maintain sobriety although she will have 1 glass of wine on special occasions but does not drink by herself.  She is not drinking to excess.  She denies any dangerous impulsivity.  She is  clearly not depressed and not particularly anxious.  She has no concerns about her medication and she has been compliant.  06/16/21 appt noted: So much better.  Going through a lot but the manic thing happened on top of it.  So much slower.  Didn't feel like losing anything with risperidone.  Likes the Adderalll at 10 mg. Some drowsiness in the AM and very drowsy from risperidone 2 mg HS. Prayer life is better. Handling stress better. Less depressed with risperidone. Still likes trazodone. Sleeps well. Plan: Reduce Prozac to 10 mg daily.  Consider stopping it because it can feel the mania however she is reluctant to do that because she fears relapse of depression. Reduce risperidone to 1.5 mg nightly due to side effects.  Discussed risk of worsening mania.  07/25/2021 appointment with the following noted: Misses the Adderall and hard to function without it. Depressed now. Heavy chest.  Anxious and guilty.  Body feels heavy.   Hates Wellbutrin.   Plan: Increase fluoxetine to 20 mg daily Add Abilify 1/2 of 15 mg tablet daily Wean wellbutrin by 1 tablet each week  bc she feels it is not helpful and DT polypharmacy Reduce risperidone to 1 daily for 1 week and stop it. Disc risk of mania. Increase Adderall to XR 20 mg AM  08/08/21 Much less depressed and starting to feel normal I feel a lot better. No SE.  Speech normal off risperidone. Sleeping OK on trazaodone and enough.   Noticed benefit from Adderall again. Plan: continue fluoxetine to 20 mg daily Continue Abilify 1/2 of 15 mg tablet daily for depression and mania continue Adderall to XR 20 mg AM  10/10/2021 phone call: Pt stated she feels like the Abilify should be decreased to 25m.She said she is depressed but rational and not suicidal.She has an appt Monday and can wait until then if you prefer. MD response: Reduce the Abilify to 7.5 mg every other day.  We will meet on 10/16/2021 and decide what to do from there.  10/16/2021  appointment with the following noted: More depressed.  Most depressed I've ever been.  Just numb.  Sense of grief.   Thinks the manic episode was unlike anything else she ever had.  Doesn't want to medicate against it.  Don't enjoy people.  Easily overwhelmed.  Had some death thoughts but not suicidal.  Has been functional.  Feels better today after reducing Abilify to every other day but she is only been doing that for 3 days. A/P: Episode of post manic depression was explained. continue fluoxetine to 20 mg daily Hold Abilify for 1 week then resume Abilify 1/2 of 15 mg tablet every other day for depression and mania continue Adderall to XR 20 mg AM  10/27/2021 appointment with the following noted: I'm doing so much better.  Handling the depression  better. Better self talk and spiritual focus has helped.   Dep 6/10 manifesting as anxiety with low confidence.   F died 2  years ago and M 65 yo and is dependent . She is working hard to feel better but still feels depressed.  She almost feels like she has a little more anxiety since restarting Abilify every other day. Plan: continue fluoxetine to 20 mg daily DC Abilify .  Vrayalar 1.5 mg QOD to try to get rid of depression ASAP. continue Adderall to XR 20 mg AM  11/10/2021 appointment with the following noted: Busy with Xmas and it was fun with family but then a big let down.  Did well with it.  Functioned well with it.  Working hard on things with depression.  Not shutting down. Not sure but feels better today but yesterday was hard.  Difficulty dealing with mother.  She won't do anything to help herself.  Yesterday with her all day.  Won't do PT and has isolated herself.    Lack of confidence.   No SE with Vraylar.  11/24/21 urgent appointment appt noted: More and more depressed.   So anxious and doesn't want to be alone but can do so. No appetite. Hurts inside. Has had some fleeting suicidal thoughts but would not act on them.  Tolerating  meds. Has been consistent with Vraylar 1.5 mg every other day, fluoxetine 20 mg daily Plan: Increase Vraylar to 1.5 mg daily Change Prozac to Trintellix 10 mg daily. Discussed side effects of each continue Adderall to XR 20 mg AM  12/27/2021 appointment with the following noted: Not OK.  I feel less depressed but feels bat shit. Not sleeping well.  Extremely anxious. Off and on sleep. 3-4 hours of sleep.   Still having daily SI.  But also become obvious has so much to do.  Overwhelmed by tasks.   Needs anxiety meds to just function. Not more motivated.  Walked yesterday.   Feels afraid like in trouble but not irritable or angry. DC DT agitation Vraylar to 1.5 mg daily Change Prozac to Trintellix 10 mg daily. Hold Adderall to XR 20 mg AM Clonidine 0.1 1/2 tablet twice daily for 2 days and if needed for anxiety and sleep increase to 1 twice daily Ok temporary Ativan 1 mg 3 times daily as needed anxiety  01/05/22 appt noted: Off fluoxetine and  Trintellix.  Only on Ativan, trazodone and Adderall XR 20 plus added clonidine 0.1 mg BID Didn't think she needed to start Trintellix. Not taking Ativan.   Didn't like herself last week. Feels some better today. Wonders if the manic sx Not agitated.  Anxiety kind of calmed down.  A lot to be anxious about situationally.  $ stress. Concerns about downers with meds. Can't access normal personality. ? Lethargy and inability to talk as sE. Plan: Latuda 20-40 mg daily with food. Adderall to XR 20 mg AM Clonidine 0.1 1/2 tablet twice daily  reduce dose to be sure no SE Ok temporary Ativan 1 mg 3 times daily as needed anxiety  01/19/22 appt noted: Taking Latuda 20 mg daily.  Took 40 mg once and felt anxious and  SI Still depressed and not very reactive Anxiety mainly about the depression and fears of the future. She wants to revisit manic sx and thinks it was maybe bc taking delta 8 bc was taking a lot of it so still doesn't think she's classic  bipolar. She wants to only take Prozac bc thinks [place]  is perpetuating depression. Says the delta 8 was very psychaedelic.  When not taking it was not manic.  Sleeping ok again.  Plan: Per her request DC Latuda 20-40 mg daily with food. She wants to continue Prozac alone AMA  Adderall to XR 20 mg AM Clonidine 0.1 1/2 tablet twice daily  reduce dose to be sure no SE Ok temporary Ativan 1 mg 3 times daily as needed anxiety  01/23/2022 phone call complaining of increased anxiety since stopping Latuda.  She will try increasing clonidine.  01/26/2022 phone call not feeling well and wanted to restart the Vraylar.  However notes indicate that had made her agitated therefore she was encouraged to pick up samples of Rexulti 1 mg and start that instead.  02/06/2022 phone call: Stating she felt the Rexulti was helping with depression but she was not sleeping well and obsessing over things.  She was encouraged to increase Rexulti to 2 mg daily and increase trazodone for sleep.  02/09/2022 appointment with the following noted: This was an urgent work in appointment No sleep last night with trazodone 100 mg HS Nothing really better depression or anxiety. Ruminating negative anxious thoughts. Did not tolerate Rexulti because it was causing insomnia.  Does not think it helped depression.  Lacks emotion that she should have.  Lacks her usual personality.  Some hopeless thoughts.  Some death thoughts.  Some suicidal thoughts without plan or intent Plan: DC Rexulti and Prozac & DC trazodone Adderall to XR 20 mg AM Clonidine 0.1 1/tablet twice daily  reduce dose to be sure no SE Ok temporary Ativan 1 mg 3 times daily as needed anxiety Start Seroquel XR 150 mg nightly  03/02/2022 appointment: Langley Gauss called back a few days after starting Seroquel stating it was making her more anxious and more depressed.  This seemed unlikely as this medicine rarely ever causes anxiety.  She stopped the medication waited 3 days  and called back still had anxiety and depression but thought perhaps the anxiety was a little better.  She did not want to take the Seroquel. She knew about the option of Spravato and wanted to pursue that. Now questions whether to return to Seroquel while waiting to start Spravato bc feels just as bad without it and knows she didn't give it enough time to work.   MADRS 46  ECT-MADRS    Flowsheet Row Clinical Support from 08/06/2022 in [place] from 07/04/2022 in [place] from 05/21/2022 in [place] Visit from 03/02/2022 in [facility]  MADRS Total Score _0 46      03/14/22 appt noted: Pt received Spravato 56 mg first dose today with some dissociative sx which were not severe.  She was anxious prior to the administration and felt better after receiving lorazepam 1 mg.  No NV, or HA. Wants to continue Spravato. Ongoing depression and desperate to feel better.  I'm not myself DT deprsssion which is most severe in recent history.  Anhedonia.  Low motivation.  Social avoidance. Continues to think all recent med trials are making her worse.  Sleep ok with Seroquel.  03/16/22 appt noted: Received Spravato 84 mg for the first time.  some dissociative sx which were not severe.  She was anxious prior to the administration and felt better after receiving lorazepam 1 mg.  No NV, or HA. Wants to continue Spravato.   Does not feel any better or different since the last appt.  Ongoing depression.  Ongoing depression and desperate to feel better.  I'm not myself DT deprsssion which is most severe in recent history.  Anhedonia.  Low motivation.  Social avoidance. Continues to think all recent med trials are making her worse.  Sleep ok with Seroquel.  Does not want to continue Seroquel for TRD.  03/20/2022 appointment noted: Came for Spravato administration today.  However blood pressure was  significantly elevated approximately 180/115.  She was given lorazepam 1 mg and clonidine 0.2 mg to try to get it down. She states she regretted stopping the Seroquel XR 300 mg tablets.  She now realizes it was helpful.  She did not sleep much at all last night.  She did not take the Adderall this morning. 2 to 3 hours after arrival blood pressure was still elevated at  170/110, 62 pulse.  For Spravato administration was canceled for today.  She admits to being anxious and depressed.  She is not suicidal.  She is highly motivated to receive the Spravato.  We discussed getting it tomorrow.  03/22/2022 appointment noted: Patient's blood pressure was never stable enough yesterday in order to get her in for Spravato administration.  She was encouraged to see her primary care doctor.  It is better today.  03/26/2022 appointment with the following noted: Blood pressure was better.  Saw her primary care doctor who started on oral Bystolic 2.5 mg daily. Received Spravato 84 mg today as scheduled.  Tolerated it well without nausea or vomiting headache or chest pain or palpitations.  Her blood pressure was borderline but manageable. She remains depressed and anxious.  She is ambivalent about the medicine and desperate to get to feel better.  Continues to have anhedonia and low energy and low motivation and reduced ability to do things.  Less social.  Not suicidal.  03/28/22 appt noted: Received Spravato 84 mg today as scheduled.  Tolerated it well without nausea or vomiting headache or chest pain or palpitations.  Her blood pressure was borderline but manageable. Has not seen any improvement so far.  Tolerating Seroquel.  Inconsistent with Bystolic and BP has been borderline high. Still depressed and anxious and anhedonia.  Low motivation, energy, productivity. Taking quetiapine and tolerating XR 300 mg nightly.  04/04/22 appt noted: Received Spravato 84 mg today as scheduled.  Tolerated it well without nausea  or vomiting headache or chest pain or palpitations.  Her blood pressure was borderline but manageable. Has not seen any improvement so far.  Tolerating Seroquel.   She still tends to think that the medications are making her worse.  She has said this about each of the recent psychiatric medicines including Seroquel.  However her husband thinks she is improved.  She also admits there is some improvement in productivity.  She still feels highly anxious.  She still does not enjoy things as normal.  She still feels desperate to improve as soon as possible. Has been taking Seroquel XR since 03/20/2022  04/10/22 appt noted: Received Spravato 84 mg today as scheduled.  Tolerated it well without nausea or vomiting headache or chest pain or palpitations.  Her blood pressure was borderline but manageable. Has not seen any improvement so far.  Tolerating Seroquel.  Doesn't like Seroquel bc she thinks it flattens here. 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  Tolerated it well without nausea or vomiting headache or  chest pain or palpitations.  Her blood pressure was borderline but manageable. Has not seen any improvement so far.  Tolerating Seroquel.  Doesn't like Seroquel bc she thinks it flattens her. Received Spravato 84 mg today as scheduled.  Tolerated it well without nausea or vomiting headache or chest pain or palpitations.  Her blood pressure was borderline but manageable. Has not seen any improvement so far.  Tolerating Seroquel.  Doesn't like Seroquel bc she thinks it flattens here.  We discussed her ambivalence about it. She is starting Auvelity and has tolerated it the last 2 days without side effect.  She still does not feel like herself and feels flat and not enjoying things with suppressed expressed emotion  04/17/2022  appointment with the following noted: Received Spravato 84 mg today as scheduled.  Tolerated it well without nausea or vomiting headache or chest pain or palpitations.  Her blood pressure was borderline but manageable. Has not seen any improvement so far.  Tolerating Seroquel.  Doesn't like Seroquel bc she thinks it flattens her. She has been tolerating the Auvelity 1 in the morning without side effects for about a week.  She has not noticed significant improvement so far.  She still feels depressed and flat and not herself.  Other people notice that she is flat emotionally.  She is not suicidal.  She does feel discouraged that she is not getting better yet.  04/19/2022 appointment noted: Has increased Auvelity to 1 twice daily for 2 days, continues quetiapine XR 300 mg nightly, clonidine 0.3 mg twice daily, lorazepam 1 mg twice daily for anxiety and Adderall XR 20 mg in the morning. No obious SE but she still thinks quetiapine XR is making her feel down.  But not sedated Received Spravato 84 mg today as scheduled.  Tolerated it well without nausea or vomiting headache or chest pain or palpitations.  Her blood pressure was borderline but manageable. She still feels quite anxious and feels it necessary to take both the clonidine and lorazepam twice a day to manage her anxiety.  She has been consistently down and flat and not herself until yesterday afternoon she noted an improvement in mood and feeling much more like herself with her normal personality reemerging.  She was quite depressed in the morning with very dark negative thoughts.  She did not have those dark negative thoughts this morning.  She had a lot of questions about medication and when she was expecting to be improved and why she has not shown improvement up to now.  04/23/22 appt noted: Has increased Auvelity to 1 twice daily for 1 week, continues quetiapine XR 300 mg nightly, clonidine 0.3 mg twice daily, lorazepam 1 mg twice daily for  anxiety and Adderall XR 20 mg in the morning. No obious SE but she still thinks quetiapine XR is making her feel down.  But not sedated Received Spravato 84 mg today as scheduled.  Tolerated it well without nausea or vomiting headache or chest pain or palpitations.  She is still depressed but admits better function and is able to enjoy social interactions. Tolerating meds.  Would like to feel better for sure. Not herself.  Flat. Plan increase Auvelity to 1 tab BID as planned and reduce Quetiapine to 1/2 of ER 300 mg  bc NR for depression.  04/25/2022 appointment with the following noted: clonidine 0.3 mg twice daily, lorazepam 1 mg twice daily for anxiety and Adderall XR 20 mg in the morning. Seroquel XR 300 HS No obious SE  but she still thinks quetiapine XR is making her feel down.  But not sedated Received Spravato 84 mg today as scheduled.  Tolerated it well without nausea or vomiting headache or chest pain or palpitations.  Called yesterday with more anxiety.  Had increased Auvelity for 1 day and reduced Seroquel XR for 1 day.  Felt restless and fearful  05/01/2022 appointment noted: clonidine 0.3 mg twice daily, lorazepam 1 mg twice daily for anxiety and Adderall XR 20 mg in the morning. Seroquel XR 150 HS, Auvelity 1 BID Received Spravato 84 mg today as scheduled.  Tolerated it well without nausea or vomiting headache or chest pain or palpitations.  Nurse has noted patient has called multiple times sometimes asking the same question repeatedly.  It is unclear whether she is truly forgetful or is just anxious seeking reassurance. Patient acknowledges ongoing depression as well as some anxiety but states she has felt a little better in the last couple of days.  She has reduced the Seroquel to 150 mg at night and has increased Auvelity to 1 twice daily but only for 1 day.  So far she seems to be tolerating it.  05/03/22 appt noted: clonidine 0.2 mg twice daily, lorazepam 1 mg twice daily for  anxiety and Adderall XR 20 mg in the morning. Seroquel XR 150 HS, Auvelity 1 BID BP high this am about 170/100 and received extra clonidine 0.2 mg and came to receive Spravato.  Not dizzy, no SOB, nor CP but BP is still high Could not receive Spravato today bc BP high and pulse low at 30 ppm. Still depressed and anxious. Plan: continue trial Auvelity with Spravato She needs to get BP and pulse managed  05/08/22 TC: RTC  H Michael NA and mailbox full.  Could not leave message.  Pt  -  talked to she and H on speaker. H worried over wife.  Vacant stare.  Slurs words at times.  Not smiling. Reduced enjoyment.  Depression.  Withdrawn from usual activities.  Some irritability.  Anxious. Disc her concerns meds are making her worse.  Extensive discussion about her treatment resistant status.  There is a consistent pattern of not taking the medicines long enough to get benefit because she believes the meds are making her worse.  However the symptoms she describes as side effects are exactly the same symptoms that she had prior to taking the medication RX for  the depression.  So it is not clear that these are actual side effects. This is true about the 2 most recent meds including Seroquel and Auvelity.  Recommend psychiatric consultation in hopes of improving her comfort level with taking prescribed medications for a sufficient length of time to provide benefit. Extensive discussion about ECT is the treatment of choice for treatment resistant depression.  Spravato may work if she can comply with consistency.  There are medication options but they take longer to work.   Plan:  Reduce clonidine to 0.1 mg BID DT bradycardia.  Talk with PCP about BP and low pulse problems which are interfering with her consistent compliance with Spravato.   Limit lorazepam to 3 -4  mg daily max. Excess use is the cause of slurring speech.  She must stop excess use or will have to stop the med. Stop Auvelity per her request.   But she has only been on the full dose for a little over a week and clearly has not had time to get benefit from it.  She thinks maybe  it is making her more anxious. Reduce Seroquel from 150XR to 50 -100 mg at night IR.  She couldn't sleep when stopped it completely. Will not start new antidepressant until her SE issues are resolved or not. Get second psych opinion from Yehuda Budd MD or another psychiatrist.  H's sister is therapist in [place], [facility], [facility]  05/16/2022 appointment with the following noted: Received Spravato 84 mg today as scheduled.  Tolerated it well without nausea or vomiting headache or chest pain or palpitations.  She stopped Auvelity as discussed last week. On her own, without physician input, she restarted Wellbutrin XL 450 mg every morning today.  She had taken it in the past.  She feels jittery and anxious. She feels less depressed than she did last week.  But she is still depressed without her usual range of affect.  She still is less social and less motivated than normal. Her primary care doctor increased the dose of losartan Plan: Stop Seroquel Reduce Wellbutrin XL to 300 mg every morning.  Starting the dose at 450 every morning is likely causing side effects of jitteriness and it should not be started at that have a dosage. Recommend she not change meds on her own without MDM put  05/23/2022 appointment with the following noted: Received Spravato 84 mg today as scheduled.  Tolerated it well without nausea or vomiting headache or chest pain or palpitations.  Has not dropped seroquel XR 300 mg 1/2 tablet nightly bc couldn't sleep without it. Has not tried lower dose quetiapine 50 mg HS Still feels depressed.   BP is better managed so far, just saw PCP.  BP is better today and infact is low today. Dropped clonidine as directed from 0.3 mg BID bc inadequate control of BP to 0.2 mg BID.  However she wants to increase it back to 0.3 mg twice daily because she  feels it helped her anxiety better.  Wonders about increasing Wellbutrin for depression.  However she has only been on 300 mg a day for a week.  She was on 450 mg daily in the past.  06/06/22 appt noted: Received Spravato 84 mg today as scheduled.  Tolerated it well without nausea or vomiting headache or chest pain or palpitations.  She is still depressed and anxious.  She wants to try to stop the Seroquel but cannot sleep without some of it.  She is taking lorazepam 1 mg 4 times daily and still having a lot of anxiety.  She wants to increase clonidine back to 0.3 mg twice daily.  She hopes for more improvement She recently went for a second psychiatric opinion as suggested the results of that are pending.  06/11/22 appt noted: Received Spravato 84 mg today as scheduled.  Tolerated it well without nausea or vomiting headache or chest pain or palpitations.  She is still depressed and anxious. Without much change.  Still hopeless, anhedonia, reduced inteterest and motivation.  Tolerating meds. Disc concerns Spravato is not hleping much. Plan: stop Seroquel and start olanzapine 10 mg HS for TRD and anxiety.  06/13/2022 appointment noted: Received Spravato 84 mg today as scheduled.  Tolerated it well without nausea or vomiting headache or chest pain or palpitations.  She is still depressed and anxious. Without much change.  Still hopeless, anhedonia, reduced inteterest and motivation.  Tolerating meds. Disc concerns Spravato is not helping much as hoped but is improving a bit in the last week. Tolerating meds. Continues Wellbutrin XL 450 AM, tolerating recently started  olanzapine  10 mg HS. Sleep is good.   Pending appt with [place] consult.  06/18/22 appt noted: Received Spravato 84 mg today as scheduled.  Tolerated it well without nausea or vomiting headache or chest pain or palpitations.  Tolerating meds. Continues Wellbutrin XL 450 AM, tolerating recently started olanzapine  10 mg HS. Continues  Adderall XR 20 amd and has tried to reduce lorazepam to 11m TID Sleep is good.   Pending appt with [facility]. Depression is a little bit better in the last week with a little improvement in emotional expression and interest.  She is pushing herself to be more active.  Her daughter thought she was a little better than she has been.  However she is still depressed and still not her normal self with anhedonia and reduced emotional expressiveness.  06/20/22 appt noted: Received Spravato 84 mg today as scheduled.  Tolerated it well without nausea or vomiting headache or chest pain or palpitations.  Tolerating meds with a little sleepiness. Continues Wellbutrin XL 450 AM, tolerating recently started olanzapine  10 mg HS. Continues Adderall XR 20 amd and has tried to reduce lorazepam to 136mTID Sleep is good.   Mood is improving.  Better funciton.  Anxiety is better with olanzapine. Still not herself and depression not gone with some anhedonia and social avoidance and feeling overwhelmed.  8/14 2023 received Spravato 84 mg 06/27/2022 received Spravato Spravato 84 mg 07/02/2022 received Spravato 84 mg 07/04/2022 received Spravato 84 mg  07/09/2022 appointment noted: Received Spravato 84 mg today as scheduled.  Tolerated it well without nausea or vomiting headache or chest pain or palpitations.  Expected dissociation and feels less depressed with resolution of negative emotions immediately after Spravato and then depression, anxiety creep back in. Continues meds Adderall XR 20 mg every morning, Wellbutrin XL 450 every morning, clonidine 0.1 mg twice daily, lorazepam 1 mg every 6 hours as needed, olanzapine increased from 7.5 to 10 mg nightly on  Tolerating meds.  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  She does note some increased appetite and is somewhat concerned about that but  has not gained significant amounts of weight. She has had the [facility] which was initially denied but she knows it can be appealed.  However because she is improving with Spravato plus the other medications now she wants to continue the current treatment plan.  07/18/22 appt noted: Continues meds Adderall XR 20 mg every morning, Wellbutrin XL 450 every morning, clonidine 0.1 mg twice daily, lorazepam 1 mg every 6 hours as needed, olanzapine increased from 7.5 to 10 mg nightly on 07/04/2022. Received Spravato 84 mg today as scheduled.  Tolerated it well without nausea or vomiting headache or chest pain or palpitations.  Expected dissociation and feels less depressed with resolution of negative emotions immediately after Spravato and then depression, anxiety creep back in. Continues meds Adderall XR 20 mg every morning, Wellbutrin XL 450 every morning, clonidine 0.1 mg twice daily, lorazepam 1 mg every 6 hours as needed, olanzapine increased from 7.5 to 10 mg nightly on  Tolerating meds.  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  She does note some increased appetite and is somewhat concerned about that but has not gained significant amounts of weight. She has had the  [place] consultation which was initially denied but she knows it can be appealed. She continues to have chronic ambivalence about psychiatric medicines and initially tends to blame her depressive symptoms such as decreased concentration and feeling flat on what ever medicine she currently is taking even though she had the same symptoms before the current medicines were started.  Then after discussion she does admit that her depressive symptoms are improved since adding olanzapine but still has those residual symptoms noted.  07/23/22 received Spravato 84 mg   07/30/2022 appointment noted: Received Spravato 84 mg today  as scheduled.  Tolerated it well without nausea or vomiting headache or chest pain or palpitations.  Expected dissociation and feels less depressed with resolution of negative emotions immediately after Spravato and then depression, anxiety creep back in. Continues meds Adderall XR 20 mg every morning, Wellbutrin XL 450 every morning, clonidine 0.1 mg twice daily, lorazepam 1 mg every 6 hours as needed, olanzapine increased from 7.5 to 10 mg nightly on  She has been inconsistent with olanzapine because she continues to be ambivalent about the medications in general and thinks that perhaps the 10 mg is making her feel blunted.  She continues to feel some depression.  She had a good day this week and but still feels somewhat depressed and persistently anxious. Plan: be consistent with olanzapine 10 mg HS for TRD and longer trial for potential benefit for anxiety.  Has not taken it consistently.  08/06/2022 appointment noted: Received Spravato 84 mg today as scheduled.  Tolerated it well without nausea or vomiting headache or chest pain or palpitations.  Expected dissociation and feels less depressed with resolution of negative emotions immediately after Spravato and then depression, anxiety creep back in. Continues meds Adderall XR 20 mg every morning, Wellbutrin XL 450 every morning, clonidine 0.1 mg twice daily, lorazepam 1 mg every 6 hours as needed, olanzapine i 10 mg nightly  She continues to feel depressed but is about 50% better with Spravato.  She is still not herself.  She still has anhedonia.  She still is not her able to engage socially in the typical ways.  She is not jovial and outgoing like normal.  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  She continues to feel that her personality is dampened down.  There is a question about whether it is related to depression or  medication. Plan: continue olanzapine 10 for longer trial for TRD and severe anxiety.  08/13/22 appt noted:  Received Spravato 84 mg today as scheduled.  Tolerated it well without nausea or vomiting headache or chest pain or palpitations.  Expected dissociation and feels less depressed with resolution of negative emotions immediately after Spravato and then depression, anxiety creep back in. Continues meds Adderall XR 20 mg every morning, Wellbutrin XL 450 every morning, clonidine 0.1 mg twice daily, lorazepam 1 mg every 6 hours as needed, olanzapine i 10 mg nightly  She still does not feel herself.  Still struggles with depression and low motivation and reduced social engagement and reduced interest and reduced emotional expression.  She is somewhat better with the medicines plus Spravato.  She still believes the Spravato makes her blunted and is not sure how much it helps her anxiety.  She can have good days when her family is around and she is engaged.  She still wants to stop the olanzapine. She  has apparently continued to take the trazodone despite having been told to stop it when she started olanzapine.  She feels like she needs the trazodone. Plan: DC olanzapine and Start nortriptyline 25 mg nightly and build up to 75 mg nightly and then check blood level.    08/27/2022 appointment noted: Received Spravato 84 mg today as scheduled.  Tolerated it well without nausea or vomiting headache or chest pain or palpitations.  Expected dissociation and feels less depressed with resolution of negative emotions immediately after Spravato and then depression, anxiety creep back in. Continues meds Adderall XR 20 mg every morning, Wellbutrin XL 450 every morning, clonidine 0.1 mg twice daily, lorazepam 1 mg every 6 hours as needed. Stopped olanzapine and started nortriptyline which she has taken for about a week is 75 mg nightly. So far she is tolerating the nortriptyline well with the exception of some dry  mouth and constipation which she is working to manage.  She does not feel substantially better better or different off the olanzapine.  No change in her sleep which is good.  Main concern currently in addition to the residual depression is anxiety which is somewhat situational with pending arch show.  She is worrying about it more than normal.  Says she is having to take lorazepam twice a day where she had been able to keep reduce it prior to this.  She still does not feel like herself with residual depression with less social interest and less of her usual buoyancy in personality.  She is flatter than normal.  Overall she still feels that the Spravato has been helpful at reducing the severity of the depression.  She is not suicidal. She has not heard anything about the [place] appeal as of yet.  09/05/2022 appointment noted: Received Spravato 84 mg today as scheduled.  Tolerated it well without nausea or vomiting headache or chest pain or palpitations.  Expected dissociation and feels less depressed with resolution of negative emotions immediately after Spravato and then depression, anxiety creep back in. Continues meds Adderall XR 20 mg every morning, Wellbutrin XL 450 every morning, clonidine 0.1 mg twice daily, lorazepam 1 mg every 6 hours as needed. Stopped olanzapine and started nortriptyline which she has taken for about 2 week is 75 mg nightly. Initially blood pressure was a little high causing delay in starting Spravato.  She admitted to feeling a little wound up.  She still experiences a little increase in depression if she goes longer than a week in between doses of Spravato.  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  09/10/22 appt noted: Received Spravato 84 mg today as scheduled.  Tolerated it well without nausea or vomiting headache or chest pain or palpitations.  Expected dissociation  gradually resolved over the 2 hour observation period. She feels 50% less depressed with Spravto and wants to continue it.   Continues meds Adderall XR 20 mg every morning, Wellbutrin XL 450 every morning, clonidine 0.1 mg twice daily, lorazepam 1 mg every 6 hours as needed. Has started nortriptyline 75 mg nightly for about 3 weeks. Has not seen a significant difference with the addition of nortriptyline.  Tolerating it pretty well. She continues to have some degree of anhedonia and significant depression and anxiety.  Her daughters noticed that she is more needy and calls more frequently.  She acknowledges this as well.  She is  clearly still not herself. Plan: pramipexole off label and RX 0.25 mg BID  09/17/2022 appointment noted: Received Spravato 84 mg today as scheduled.  Tolerated it well without nausea or vomiting headache or chest pain or palpitations.  Expected dissociation gradually resolved over the 2 hour observation period. She feels 50% less depressed with Spravto and wants to continue it.   Continues meds Adderall XR 20 mg every morning, Wellbutrin XL 450 every morning, clonidine 0.1 mg twice daily, lorazepam 1 mg every 6 hours as needed. Has started nortriptyline 75 mg nightly for about 3 weeks and DT level 176, reduced to 50 mg HS early November. Still the same sx as noted last visit.  Tolerating meds.   Compliant.  Still depressed and family notices.  Has been able to participate in family interactions.  Some post-show let down and has to do detailed work which is hard for her bc ADD.  Sleep and eating well.  Energy OK but not great.  No SI.  Not cried in a year or so.  Clearly less depressed and hopeless than before the Spravato.  09/24/22 appt noted: Received Spravato 84 mg today as scheduled.  Tolerated it well without nausea or vomiting headache or chest pain or palpitations.  Expected dissociation gradually resolved over the 2 hour observation period. She feels 50% less  depressed with Spravto and wants to continue it.   Continues meds Adderall XR 20 mg every morning, Wellbutrin XL 450 every morning, clonidine 0.1 mg twice daily, lorazepam 1 mg every 6 hours as needed. Has started nortriptyline 75 mg nightly for about 3 weeks and DT level 176, reduced to 50 mg HS early November. Still the same sx as noted last visit.  Tolerating meds.   Compliant.  Still depressed and family notices.  Has been able to participate in family interactions.  Some post-show let down and has to do detailed work which is hard for her bc ADD.  Sleep and eating well.  Energy OK but not great.  No SI.  Not cried in a year or so.  Clearly less depressed and hopeless than before the Spravato. Is not making further progress generally.  Stuck with moderate depression  10/02/22 appt noted: Received Spravato 84 mg today as scheduled.  Tolerated it well without nausea or vomiting headache or chest pain or palpitations.  Expected dissociation gradually resolved over the 2 hour observation period. She feels 50% less depressed with Spravto and wants to continue it.   Continues meds Adderall XR 20 mg every morning, Wellbutrin XL 450 every morning, clonidine 0.1 mg twice daily, lorazepam 1 mg every 6 hours as needed. Has started nortriptyline 75 mg nightly for about 3 weeks and DT level 176, reduced to 50 mg HS early November. Still the same sx as noted last visit.  Tolerating meds.   Compliant.  Still depressed and family notices.  Has been able to participate in family interactions.  Some post-show let down and has to do detailed work which is hard for her bc ADD.  Sleep and eating well.  Energy OK but not great.  No SI.  Not cried in a year or so.  Clearly less depressed and hopeless than before the Spravato. Is not making further progress generally.  Stuck with moderate depression.  Is able to function pretty normally. Plan: trial pramipexole 0.25 mg BID off label for depression.   10/08/22 appt  noted: Received Spravato 84 mg today as scheduled.  Tolerated it well without nausea  or vomiting headache or chest pain or palpitations.  Expected dissociation gradually resolved over the 2 hour observation period. She feels 50% less depressed with Spravto and wants to continue it.   Continues meds Adderall XR 20 mg every morning, Wellbutrin XL 450 every morning, clonidine 0.1 mg twice daily, lorazepam 1 mg every 6 hours as needed. Has started nortriptyline 75 mg nightly for about 3 weeks and DT level 176, reduced to 50 mg HS early November. Still the same sx as noted last visit.  Tolerating meds.   Compliant.  Still depressed and family notices.  Has been able to participate in family interactions.  Some post-show let down and has to do detailed work which is hard for her bc ADD.  Sleep and eating well.  Energy OK but not great.  No SI.  Not cried in a year or so.  Clearly less depressed and hopeless than before the Spravato. Is not making further progress generally.  Stuck with moderate depression.  Behaved and felt pretty normally with family over for Thanksgiving. Doesn't see benefit or SE with pramipexole but thinks maybe it makes her worse. Plan:  increase pramipexole augmentation off label to 0.5 mg BID  10/15/2022 appointment noted: Received Spravato 84 mg today as scheduled.  Tolerated it well without nausea or vomiting headache or chest pain or palpitations.  Expected dissociation gradually resolved over the 2 hour observation period. She feels 50% less depressed with Spravto and wants to continue it.   Continues meds Adderall XR 20 mg every morning, Wellbutrin XL 450 every morning, clonidine 0.2 mg twice daily, lorazepam 1 mg every 6 hours as needed.  Trazodone 50 mg tablets 1-2 nightly as needed insomnia Has started nortriptyline 75 mg nightly for about 3 weeks and DT level 176, reduced to 50 mg HS early November. Recommended increase pramipexole 0.5 mg twice daily off label for treatment  resistant depression on 10/08/2022. She feels better motivated and more active with pramipexole No SE  ECT-MADRS    Flowsheet Row Clinical Support from 08/06/2022 in [place] from 07/04/2022 in [place] from 05/21/2022 in [place] Visit from 03/02/2022 in [facility]  MADRS Total Score _0 46        Past Psychiatric Medication Trials: fluoxetine, duloxetine, Viibryd, lamotrigine, Pristiq, sertraline, citalopram,  Trintellix anxious and SI Wellbutrin XL 450 [place] 1 dose  Adderall, Adderall XR, Vyvanse, Ritalin, Strattera low dose NR Lorazepam Trazodone  Depakote,  lamotrigine cog complaints Lithium remotely Abilify 7.5  Vraylar 1.5 mg daily agitation and insomnia Rexulti insomnia Latuda 40 one dose, CO anxious and SI Seroquel XR 300 Olanzapine 10  At visit November 12, 2019. We discussed Patient developed an increasingly severe alcohol dependence problem since her last visit in January.  She went to [facility] and has had no alcohol since then except 1 day.  She never abused stimulants but they took her off the stimulants at [facility].  Her ADD was markedly worse.  The Wellbutrin did not help the ADD.   Chang history lamotrigine rash at 66 yo  Review of Systems:  Review of Systems  Constitutional:  Positive for fatigue.  Respiratory:  Negative for shortness of breath.   Cardiovascular:  Negative for chest pain and palpitations.  Musculoskeletal:  Positive for arthralgias and back pain. Negative for joint swelling.       SP hip surgery October 2020  Neurological:  Negative for dizziness and  tremors.  Psychiatric/Behavioral:  Positive for decreased concentration and dysphoric mood. Negative for agitation, behavioral problems, confusion, hallucinations, self-injury, sleep disturbance and suicidal ideas. The patient is nervous/anxious. The patient is  not hyperactive.     Medications: I have reviewed the patient's current medications.  Current Outpatient Medications  Medication Sig Dispense Refill   amLODipine (NORVASC) 2.5 MG tablet Take 2.5 mg by mouth daily.     amphetamine-dextroamphetamine (ADDERALL XR) 20 MG 24 hr capsule Take 1 capsule (20 mg total) by mouth every morning. 30 capsule 0   amphetamine-dextroamphetamine (ADDERALL XR) 20 MG 24 hr capsule Take 1 capsule (20 mg total) by mouth every morning. 30 capsule 0   buPROPion (WELLBUTRIN XL) 150 MG 24 hr tablet TAKE 3 TABLETS BY MOUTH DAILY 270 tablet 1   cloNIDine (CATAPRES) 0.2 MG tablet Take 1 tablet (0.2 mg total) by mouth 2 (two) times daily. 60 tablet 1   Dextromethorphan HBr 15 MG CAPS Take 1 capsule (15 mg total) by mouth every 12 (twelve) hours. 60 capsule 0   iron polysaccharides (NIFEREX) 150 MG capsule TAKE 1 CAPSULE BY MOUTH EVERY DAY 90 capsule 1   LORazepam (ATIVAN) 1 MG tablet Take 1 tablet (1 mg total) by mouth every 8 (eight) hours as needed. for anxiety 90 tablet 0   losartan (COZAAR) 50 MG tablet Take 50 mg by mouth daily.     nebivolol (BYSTOLIC) 2.5 MG tablet Take 2.5 mg by mouth daily.     nortriptyline (PAMELOR) 25 MG capsule TAKE 3 CAPSULES (75 MG TOTAL) BY MOUTH AT BEDTIME. 270 capsule 1   pramipexole (MIRAPEX) 0.5 MG tablet Take 1 tablet (0.5 mg total) by mouth 2 (two) times daily. 60 tablet 0   SPRAVATO, 84 MG DOSE, 28 MG/DEVICE SOPK USE 3 SPRAYS IN EACH NOSTRIL TWICE A WEEK 3 each 3   traZODone (DESYREL) 50 MG tablet 1-2 TABLETS NIGHTLY AS NEEDED FOR SLEEP 60 tablet 1   No current facility-administered medications for this visit.    Medication Side Effects: None  Allergies:  Allergies  Allergen Reactions   Metronidazole Shortness Of Breath and Other (See Comments)    Heart pounding   Ferrlecit [Na Ferric Gluc Cplx In Sucrose] Other (See Comments)    Infusion reaction 05/12/2019    Past Medical History:  Diagnosis Date   ADHD    Anemia     Anxiety    Arthritis    Depression    Heart murmur    i went to see a cardiologit slast eyar  and i had zero plaque,    PONV (postoperative nausea and vomiting)    Recovering alcoholic in remission ([place])     Family History  Problem Relation Age of Onset   Atrial fibrillation Mother    CAD Father     Past Medical History, Surgical history, Social history, and Family history were reviewed and updated as appropriate.   Please see review of systems for further details on the patient's review from today.   Objective:   Physical Exam:  There were no vitals taken for this visit.  Physical Exam Constitutional:      General: She is not in acute distress. Neurological:     Mental Status: She is alert and oriented to person, place, and time.     Coordination: Coordination normal.     Gait: Gait normal.  Psychiatric:        Attention and Perception: Attention and perception normal.  Mood and Affect: Mood is anxious and depressed. Affect is not labile, angry or tearful.        Speech: Speech is not rapid and pressured, slurred or tangential.        Behavior: Behavior is not slowed.        Thought Content: Thought content is not paranoid or delusional. Thought content does not include homicidal or suicidal ideation. Thought content does not include suicidal plan.        Cognition and Memory: Cognition normal. Memory is not impaired. She does not exhibit impaired recent memory.        Judgment: Judgment normal.     Comments: Insight intact. No auditory or visual hallucinations. No delusions.  Depression ongoing residual betterrecently. Affect still depressed but less blunted than it was before Spravato.  No Sui intent plan      Lab Review:     Component Value Date/Time   NA 137 01/12/2021 1430   NA 140 11/18/2018 1544   K 3.8 01/12/2021 1430   CL 108 01/12/2021 1430   CO2 22 01/12/2021 1430   GLUCOSE 94 01/12/2021 1430   BUN 14 01/12/2021 1430   BUN 20 11/18/2018 1544    CREATININE 0.82 01/12/2021 1430   CALCIUM 8.9 01/12/2021 1430   PROT 6.6 01/12/2021 1430   ALBUMIN 3.9 01/12/2021 1430   AST 12 (L) 01/12/2021 1430   ALT 11 01/12/2021 1430   ALKPHOS 46 01/12/2021 1430   BILITOT 0.5 01/12/2021 1430   GFRNONAA >60 01/12/2021 1430   GFRAA >60 09/02/2019 0249   GFRAA >60 01/27/2019 0811       Component Value Date/Time   WBC 4.5 01/12/2021 1430   RBC 4.32 01/12/2021 1430   HGB 12.8 01/12/2021 1430   HGB 12.9 07/17/2019 0953   HCT 38.5 01/12/2021 1430   HCT 21.9 (L) 12/25/2018 1221   PLT 272 01/12/2021 1430   PLT 286 07/17/2019 0953   MCV 89.1 01/12/2021 1430   MCH 29.6 01/12/2021 1430   MCHC 33.2 01/12/2021 1430   RDW 12.4 01/12/2021 1430   LYMPHSABS 1.4 01/12/2021 1430   MONOABS 0.4 01/12/2021 1430   EOSABS 0.0 01/12/2021 1430   BASOSABS 0.0 01/12/2021 1430    No results found for: "POCLITH", "LITHIUM"   No results found for: "PHENYTOIN", "PHENOBARB", "VALPROATE", "CBMZ"   .res Assessment: Plan:    No diagnosis found.   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  Have  discussed some of her recent abnormal behaviors leading to this depressive episode getting worse which she says were associated with heavy use of delta 8 and not a manic episode.  She realizes now that that was not good for her.  She stopped all use of other drugs including those available over-the-counter such as delta 8 or any other THC related products.  She is no longer having any of those types of behaviors and instead is depressed.  She remains depressed with lack of interest and lack of feeling for things that normally she would have feelings about.  She has low motivation and energy.  She has difficulty making decisions.  She is sad and down.  She is less productive than usual.  Her concentration is poor.  She has high degree of anxiety as well.  Her personality is more flat than normal for her.. She has a very negative self-image and low  self-esteem.  These are all uncharacteristic for her However all of these symptoms are partially improved through  Spravato and the switch from Seroquel to olanzapine.  She is approximately 50% better with Spravato but perhaps sig additional benefit with pramipexole 0.5 mg BID  Patient was administered Spravato 84 mg intranasally today.  The patient experienced the typical dissociation which gradually resolved over the 2-hour period of observation.  There were no complications.  Specifically the patient did not have nausea or vomiting or headache.  Blood pressures monitored at the 40-minute and 2-hour follow-up intervals.  Borderline high.  By the time the 2-hour observation period was met the patient was alert and oriented and able to exit without assistance.  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  See nursing note for further details.She wants to continue Spravato. We discussed discussed the side effects in detail as well as the protocol required to receive Spravato.   Failed multiple antidepressants.  Many of them were not actual failures but intolerances and it is unclear whether some of that was more connected with anxiety than true side effects.  1 example is the [place].  In general she does not want to try anything but an antidepressant but has failed all major categories of antidepressants except TCAs and MAO inhibitors which have not been tried until now. There is a consistent pattern of thinking she is worse with medication which is not consistent with objective evidence.    Her self assessment is clouded by her depression.  the next logical antidepressant would be to use a tricyclic antidepressant as she has not taken 1 of these medications before. Started nortriptyline 75 mg nightly.  Serum level 176.  Reduced to 50 mg HS  Discussed side effects in detail.  Needs more time to help. Extensive  discussion previously about her ambivalence about meds and missatributing sx of depression as SE of meds.    continue Wellbutrin XL 450 mg AM  Off label augmentation DM at a lower dose of 15 mg BID-defer Instead increase pramipexole augmentation off label to 0.5 mg BID  Started Spravato 84 mg twice weekly on 03/16/2022.  Now on weekly administration  Adderall  XR 20 mg AM  Ok temporary Ativan 1 mg 3 times daily as needed anxiety but try to cut it back. Is not ideal to use benzodiazepine with stimulant but because of the severity of her symptoms it has been necessary.  Hope to eventually eliminate the benzodiazepine.  Expected as her depression improves her anxiety will improve as well.  However lately her anxiety has been unmanageable.  We will expect that to improve as the depression improves.  She has headed insturctions to reduce this.  Continue clonidine 0.2 mg BID off label for anxiety and helps BP partially. BP is better controlled but not consistent.  Consider increasing amlodipine.  Also on losartan 50  Discussed potential benefits, risks, and side effects of stimulants with patient to include increased heart rate, palpitations, insomnia, increased anxiety, increased irritability, or decreased appetite.  Instructed patient to contact office if experiencing any significant tolerability issues. She wants to return to usual dose of Adderall for ADD bc of mor poor cognitive function with reduction.  Also discussed that depression will impair cognitive function.  [place] consultation was initially denied .  She says she called [facility] about this but they have not responded.    Extensive discussion about her chronic ambivalence about psychiatric medicines and tendency to blame medicine for the symptoms of depression that she had prior to  even starting the medications.  After discussing this at length she was able to acknowledge that that is factual.  She does not appear to be having any  significant side effects with the current medications.  However so far not much benefit with nortriptyline despite adequate level.  Has Maintained sobriety  FU with Spravato weekly   Lynder Parents, MD, DFAPA     Please see After Visit Summary for patient specific instructions.  No future appointments.             No orders of the defined types were placed in this encounter.      -------------------------------

## 2022-10-16 NOTE — Progress Notes (Signed)
NURSES NOTE:   Patient arrived for her 44th Spravato treatment. Pt is being treated for Treatment Resistant Depression, pt will be receiving 84 mg which will continue to be her maintenance dose, she receives weekly treatments.  Pt is also planning on [place]. Patient arrived and taken to treatment room. Confirmed she had a ride home which is her husband would be coming back to pick up pt when done and sometimes she needs to use [facility] if he is unable to pick her up. Pt's Spravato is ordered through [employer] and delivered to office, all Spravato medication is stored at doctors office per REMS/FDA guidelines. The medication is required to be locked behind two doors per FDA/REMS Protocol. Medication is also disposed of properly per regulations.      Began taking patient's vital signs at 10:15 AM 137/90, pulse 49, Pulse Ox 98%, Gave patient first dose 28 mg nasal spray, each nasal spray administered in each nostril as directed and waited 5 minutes between the second and third dose. All 3 doses given pt did not complain of any nausea/vomiting, given a cup of water due to the taste after the administration of Spravato.  She listens to [employer] with spa or relaxing music.  Checked 40 minute vitals at 11:10 AM, 128/83, pulse 45, Pulse Ox 94%. Explained she would be monitored for a total time of 120 minutes. Discharge vitals were taken at 12:15 PM 116/90 P 54, 99% Pulse Ox. Dr. Clovis Pu met with pt today and discussed her medication. I walked pt to elevator, where her husband met her for the ride home. Recommend she go home and sleep or just relax on the couch. No driving, no intense activities. Verbalized understanding. Nurse was with pt a total of 70 minutes for clinical. Pt is coming back next week, Monday, December 11th. Pt instructed to call office with any problems or questions.    LOT [id number] EXP AUG 2025

## 2022-10-22 ENCOUNTER — Encounter: Payer: Self-pay | Admitting: Psychiatry

## 2022-10-22 ENCOUNTER — Ambulatory Visit (INDEPENDENT_AMBULATORY_CARE_PROVIDER_SITE_OTHER): Payer: 59 | Admitting: Psychiatry

## 2022-10-22 ENCOUNTER — Other Ambulatory Visit: Payer: Self-pay

## 2022-10-22 ENCOUNTER — Ambulatory Visit: Payer: 59

## 2022-10-22 VITALS — BP 111/82 | HR 77

## 2022-10-22 DIAGNOSIS — F339 Major depressive disorder, recurrent, unspecified: Secondary | ICD-10-CM

## 2022-10-22 DIAGNOSIS — I10 Essential (primary) hypertension: Secondary | ICD-10-CM

## 2022-10-22 DIAGNOSIS — F411 Generalized anxiety disorder: Secondary | ICD-10-CM

## 2022-10-22 DIAGNOSIS — F5105 Insomnia due to other mental disorder: Secondary | ICD-10-CM | POA: Diagnosis not present

## 2022-10-22 DIAGNOSIS — F900 Attention-deficit hyperactivity disorder, predominantly inattentive type: Secondary | ICD-10-CM | POA: Diagnosis not present

## 2022-10-22 MED ORDER — NORTRIPTYLINE HCL 25 MG PO CAPS: 50.0000 mg | ORAL_CAPSULE | Freq: Every day | ORAL | 0 refills | Status: DC

## 2022-10-22 MED ORDER — AMPHETAMINE-DEXTROAMPHET ER 20 MG PO CP24: 20.0000 mg | ORAL_CAPSULE | ORAL | 0 refills | Status: DC

## 2022-10-22 MED ORDER — CLONIDINE HCL 0.2 MG PO TABS: 0.2000 mg | ORAL_TABLET | Freq: Two times a day (BID) | ORAL | 2 refills | Status: DC

## 2022-10-22 MED ORDER — LORAZEPAM 1 MG PO TABS: 1.0000 mg | ORAL_TABLET | Freq: Three times a day (TID) | ORAL | 0 refills | Status: DC | PRN

## 2022-10-22 MED ORDER — PRAMIPEXOLE DIHYDROCHLORIDE 0.5 MG PO TABS: 0.5000 mg | ORAL_TABLET | Freq: Three times a day (TID) | ORAL | 0 refills | Status: DC

## 2022-10-22 NOTE — Progress Notes (Signed)
Laura Chang [id number] Oct 04, 1956 66 y.o.  Subjective:   Patient ID:  Laura Chang is a 67 y.o. (DOB 1956/05/19) female.  Chief Complaint:  Chief Complaint  Patient presents with   Follow-up   Depression   Anxiety   ADHD   Fatigue     HPI Laura Chang presents to the office today for follow-up of depression and anxiety and ADD.  seen November 12, 2019.  Melted down in 2020.  Went to [employer] in July.  No withdrawal.  1 drink since then.  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 She was on no stimulant and no SSRI but was taking Strattera and Wellbutrin.  The following changes were made. Stop Strattera. OK restart stimulant bc severe ADD Restart Adderall 1 daily for a few days and if tolerated then restart 1 twice daily. If not tolerated reduce the dosage if needed. May need to stop Wellbutrin if not tolerating the stimulant.  Yes.  DC Wellbutrin Restart Prozac 20 mg daily.  February 2021 appointment with the following noted: Completed grant proposal.  Couldn't doit without Adderall.  Sold a bunch of work.   Adderall XR lasts about 3 pm.  Strength seems about right.  BP been OK.  Not jittery.   Stopped Wellbutrin but had no SE. Mood drastically better with grant proposal and back on fluoxetine.  Less depressed and lethargic.  No anxiety.  Cut back on coffee. Started back with devotions and stronger faith. Plan: Continue Prozac 20 mg daily. May have to increase the dose at some point in the future given that she usually was taking higher dosages but she is getting good response at this time. Restart Wellbutrin off label for ADD since can't get 2 ADDERALL daily. 150 mg daily then 300 mg daily. She can adjust the dose between 150 mg and 300 mg daily to get the optimal effect.   05/11/2020 appointment with the following noted: Has been inconsistent with Prozac and Wellbutrin. Not sure of the effect of Wellbutrin. Biggest deterrent in work  is anxiety.  Some of the work is conceptual and difficult at times.  Can feel she's not up to a project at times.  Overall is OK but would like a steadier benefit from stimulant.  Exhausted from managing concentration and keeping up with things from the day.  Loses things.  Not good keeping up with schedule. Overall productive and emotionally OK. Can feel Adderall wear off. Mood is better in summer and worse in the winter.   F died in 10/21/2023 and that is a loss. No SE Wellbutrin. Still attends AA meetings.  Real benefit from [place] last year. Recognizes effect of anemia on ADD and mood.  Had iron infusions last winter. Plan:  Wellbutrin off label for ADD since can't get 2 ADDERALL daily. 150 mg daily then 300 mg daily.  01/24/2021 appointment with following noted: Doing a program called [employer] mindfulness app since Xmas.  CBT app helped the depression.  App helped her focus better.  Lost sign weight. Writing a lot. Before Xmas felt depressed and started negative thinking worse, self denigrating. Not drinking. More isolated.   Recognizes mo is narcissist.    Didn't tell anyone she was born until 3 mos later.  M aloof and uninterested in pt.  Lied about her birthday.  Mo lack of affection even with pt's kids. Going to [place] for a year and it helped her to quit drinking. Also misses  kids being gone with a hole also. Plan: No med changes  05/04/2021 appointment with the following noted: Therapist Paula Pile thinks she's manic. Lost weight to 144#.   States she is still sleeping okay.  Admits she is hyper and recognizes that she is likely manic.  She feels great, euphoric with an increased sense of spiritual connectedness to God.  She has racing thoughts and talks fast and talks a lot and this is noted by her husband.  He thinks she is a bit hyper.  She has been able to maintain sobriety although she will have 1 glass of wine on special occasions but does not drink by herself.  She is not  drinking to excess.  She denies any dangerous impulsivity.  She is clearly not depressed and not particularly anxious.  She has no concerns about her medication and she has been compliant.  06/16/21 appt noted: So much better.  Going through a lot but the manic thing happened on top of it.  So much slower.  Didn't feel like losing anything with risperidone.  Likes the Adderalll at 10 mg. Some drowsiness in the AM and very drowsy from risperidone 2 mg HS. Prayer life is better. Handling stress better. Less depressed with risperidone. Still likes trazodone. Sleeps well. Plan: Reduce Prozac to 10 mg daily.  Consider stopping it because it can feel the mania however she is reluctant to do that because she fears relapse of depression. Reduce risperidone to 1.5 mg nightly due to side effects.  Discussed risk of worsening mania.  07/25/2021 appointment with the following noted: Misses the Adderall and hard to function without it. Depressed now. Heavy chest.  Anxious and guilty.  Body feels heavy.   Hates Wellbutrin.   Plan: Increase fluoxetine to 20 mg daily Add Abilify 1/2 of 15 mg tablet daily Wean wellbutrin by 1 tablet each week  bc she feels it is not helpful and DT polypharmacy Reduce risperidone to 1 daily for 1 week and stop it. Disc risk of mania. Increase Adderall to XR 20 mg AM  08/08/21 Much less depressed and starting to feel normal I feel a lot better. No SE.  Speech normal off risperidone. Sleeping OK on trazaodone and enough.   Noticed benefit from Adderall again. Plan: continue fluoxetine to 20 mg daily Continue Abilify 1/2 of 15 mg tablet daily for depression and mania continue Adderall to XR 20 mg AM  10/10/2021 phone call: Pt stated she feels like the Abilify should be decreased to 5mg.She said she is depressed but rational and not suicidal.She has an appt Monday and can wait until then if you prefer. MD response: Reduce the Abilify to 7.5 mg every other day.  We will meet on  10/16/2021 and decide what to do from there.  10/16/2021 appointment with the following noted: More depressed.  Most depressed I've ever been.  Just numb.  Sense of grief.   Thinks the manic episode was unlike anything else she ever had.  Doesn't want to medicate against it.  Don't enjoy people.  Easily overwhelmed.  Had some death thoughts but not suicidal.  Has been functional.  Feels better today after reducing Abilify to every other day but she is only been doing that for 3 days. A/P: Episode of post manic depression was explained. continue fluoxetine to 20 mg daily Hold Abilify for 1 week then resume Abilify 1/2 of 15 mg tablet every other day for depression and mania continue Adderall to XR 20 mg   AM  10/27/2021 appointment with the following noted: I'm doing so much better.  Handling the depression better. Better self talk and spiritual focus has helped.   Dep 6/10 manifesting as anxiety with low confidence.   F died 2  years ago and M 66 yo and is dependent . She is working hard to feel better but still feels depressed.  She almost feels like she has a little more anxiety since restarting Abilify every other day. Plan: continue fluoxetine to 20 mg daily DC Abilify .  Vrayalar 1.5 mg QOD to try to get rid of depression ASAP. continue Adderall to XR 20 mg AM  11/10/2021 appointment with the following noted: Busy with Xmas and it was fun with family but then a big let down.  Did well with it.  Functioned well with it.  Working hard on things with depression.  Not shutting down. Not sure but feels better today but yesterday was hard.  Difficulty dealing with mother.  She won't do anything to help herself.  Yesterday with her all day.  Won't do PT and has isolated herself.    Lack of confidence.   No SE with Vraylar.  11/24/21 urgent appointment appt noted: More and more depressed.   So anxious and doesn't want to be alone but can do so. No appetite. Hurts inside. Has had some fleeting  suicidal thoughts but would not act on them.  Tolerating meds. Has been consistent with Vraylar 1.5 mg every other day, fluoxetine 20 mg daily Plan: Increase Vraylar to 1.5 mg daily Change Prozac to Trintellix 10 mg daily. Discussed side effects of each continue Adderall to XR 20 mg AM  12/27/2021 appointment with the following noted: Not OK.  I feel less depressed but feels bat shit. Not sleeping well.  Extremely anxious. Off and on sleep. 3-4 hours of sleep.   Still having daily SI.  But also become obvious has so much to do.  Overwhelmed by tasks.   Needs anxiety meds to just function. Not more motivated.  Walked yesterday.   Feels afraid like in trouble but not irritable or angry. DC DT agitation Vraylar to 1.5 mg daily Change Prozac to Trintellix 10 mg daily. Hold Adderall to XR 20 mg AM Clonidine 0.1 1/2 tablet twice daily for 2 days and if needed for anxiety and sleep increase to 1 twice daily Ok temporary Ativan 1 mg 3 times daily as needed anxiety  01/05/22 appt noted: Off fluoxetine and  Trintellix.  Only on Ativan, trazodone and Adderall XR 20 plus added clonidine 0.1 mg BID Didn't think she needed to start Trintellix. Not taking Ativan.   Didn't like herself last week. Feels some better today. Wonders if the manic sx Not agitated.  Anxiety kind of calmed down.  A lot to be anxious about situationally.  $ stress. Concerns about downers with meds. Can't access normal personality. ? Lethargy and inability to talk as sE. Plan: Latuda 20-40 mg daily with food. Adderall to XR 20 mg AM Clonidine 0.1 1/2 tablet twice daily  reduce dose to be sure no SE Ok temporary Ativan 1 mg 3 times daily as needed anxiety  01/19/22 appt noted: Taking Latuda 20 mg daily.  Took 40 mg once and felt anxious and  SI Still depressed and not very reactive Anxiety mainly about the depression and fears of the future. She wants to revisit manic sx and thinks it was maybe bc taking delta 8 bc was  taking a lot of   it so still doesn't think she's classic bipolar. She wants to only take Prozac bc thinks Latuda is perpetuating depression. Says the delta 8 was very psychaedelic.  When not taking it was not manic.  Sleeping ok again.  Plan: Per her request DC Latuda 20-40 mg daily with food. She wants to continue Prozac alone AMA  Adderall to XR 20 mg AM Clonidine 0.1 1/2 tablet twice daily  reduce dose to be sure no SE Ok temporary Ativan 1 mg 3 times daily as needed anxiety  01/23/2022 phone call complaining of increased anxiety since stopping Latuda.  She will try increasing clonidine.  01/26/2022 phone call not feeling well and wanted to restart the Vraylar.  However notes indicate that had made her agitated therefore she was encouraged to pick up samples of Rexulti 1 mg and start that instead.  02/06/2022 phone call: Stating she felt the Rexulti was helping with depression but she was not sleeping well and obsessing over things.  She was encouraged to increase Rexulti to 2 mg daily and increase trazodone for sleep.  02/09/2022 appointment with the following noted: This was an urgent work in appointment No sleep last night with trazodone 100 mg HS Nothing really better depression or anxiety. Ruminating negative anxious thoughts. Did not tolerate Rexulti because it was causing insomnia.  Does not think it helped depression.  Lacks emotion that she should have.  Lacks her usual personality.  Some hopeless thoughts.  Some death thoughts.  Some suicidal thoughts without plan or intent Plan: DC Rexulti and Prozac & DC trazodone Adderall to XR 20 mg AM Clonidine 0.1 1/tablet twice daily  reduce dose to be sure no SE Ok temporary Ativan 1 mg 3 times daily as needed anxiety Start Seroquel XR 150 mg nightly  03/02/2022 appointment: Denise called back a few days after starting Seroquel stating it was making her more anxious and more depressed.  This seemed unlikely as this medicine rarely ever  causes anxiety.  She stopped the medication waited 3 days and called back still had anxiety and depression but thought perhaps the anxiety was a little better.  She did not want to take the Seroquel. She knew about the option of Spravato and wanted to pursue that. Now questions whether to return to Seroquel while waiting to start Spravato bc feels just as bad without it and knows she didn't give it enough time to work.   MADRS 46  ECT-MADRS    Flowsheet Row Clinical Support from 08/06/2022 in [facility] Clinical Support from 07/04/2022 in [facility] Clinical Support from 05/21/2022 in [facility] Office Visit from 03/02/2022 in [facility]  MADRS Total Score 29 15 27 46      03/14/22 appt noted: Pt received Spravato 56 mg first dose today with some dissociative sx which were not severe.  She was anxious prior to the administration and felt better after receiving lorazepam 1 mg.  No NV, or HA. Wants to continue Spravato. Ongoing depression and desperate to feel better.  I'm not myself DT deprsssion which is most severe in recent history.  Anhedonia.  Low motivation.  Social avoidance. Continues to think all recent med trials are making her worse.  Sleep ok with Seroquel.  03/16/22 appt noted: Received Spravato 84 mg for the first time.  some dissociative sx which were not severe.  She was anxious prior to the administration and felt better after receiving lorazepam 1 mg.  No NV, or HA. Wants   to continue Spravato.   Does not feel any better or different since the last appt.  Ongoing depression.  Ongoing depression and desperate to feel better.  I'm not myself DT deprsssion which is most severe in recent history.  Anhedonia.  Low motivation.  Social avoidance. Continues to think all recent med trials are making her worse.  Sleep ok with Seroquel.  Does not want to continue Seroquel for TRD.  03/20/2022 appointment noted: Came for  Spravato administration today.  However blood pressure was significantly elevated approximately 180/115.  She was given lorazepam 1 mg and clonidine 0.2 mg to try to get it down. She states she regretted stopping the Seroquel XR 300 mg tablets.  She now realizes it was helpful.  She did not sleep much at all last night.  She did not take the Adderall this morning. 2 to 3 hours after arrival blood pressure was still elevated at  170/110, 62 pulse.  For Spravato administration was canceled for today.  She admits to being anxious and depressed.  She is not suicidal.  She is highly motivated to receive the Spravato.  We discussed getting it tomorrow.  03/22/2022 appointment noted: Patient's blood pressure was never stable enough yesterday in order to get her in for Spravato administration.  She was encouraged to see her primary care doctor.  It is better today.  03/26/2022 appointment with the following noted: Blood pressure was better.  Saw her primary care doctor who started on oral Bystolic 2.5 mg daily. Received Spravato 84 mg today as scheduled.  Tolerated it well without nausea or vomiting headache or chest pain or palpitations.  Her blood pressure was borderline but manageable. She remains depressed and anxious.  She is ambivalent about the medicine and desperate to get to feel better.  Continues to have anhedonia and low energy and low motivation and reduced ability to do things.  Less social.  Not suicidal.  03/28/22 appt noted: Received Spravato 84 mg today as scheduled.  Tolerated it well without nausea or vomiting headache or chest pain or palpitations.  Her blood pressure was borderline but manageable. Has not seen any improvement so far.  Tolerating Seroquel.  Inconsistent with Bystolic and BP has been borderline high. Still depressed and anxious and anhedonia.  Low motivation, energy, productivity. Taking quetiapine and tolerating XR 300 mg nightly.  04/04/22 appt noted: Received Spravato  84 mg today as scheduled.  Tolerated it well without nausea or vomiting headache or chest pain or palpitations.  Her blood pressure was borderline but manageable. Has not seen any improvement so far.  Tolerating Seroquel.   She still tends to think that the medications are making her worse.  She has said this about each of the recent psychiatric medicines including Seroquel.  However her husband thinks she is improved.  She also admits there is some improvement in productivity.  She still feels highly anxious.  She still does not enjoy things as normal.  She still feels desperate to improve as soon as possible. Has been taking Seroquel XR since 03/20/2022  04/10/22 appt noted: Received Spravato 84 mg today as scheduled.  Tolerated it well without nausea or vomiting headache or chest pain or palpitations.  Her blood pressure was borderline but manageable. Has not seen any improvement so far.  Tolerating Seroquel.  Doesn't like Seroquel bc she thinks it flattens here. Ongoing depression without confidence Plan: Start Auvelity 1 every morning for persistent treatment resistant depression  04/12/2022 appointment with the following noted:  Received Spravato 84 mg today as scheduled.  Tolerated it well without nausea or vomiting headache or chest pain or palpitations.  Her blood pressure was borderline but manageable. Has not seen any improvement so far.  Tolerating Seroquel.  Doesn't like Seroquel bc she thinks it flattens her. Received Spravato 84 mg today as scheduled.  Tolerated it well without nausea or vomiting headache or chest pain or palpitations.  Her blood pressure was borderline but manageable. Has not seen any improvement so far.  Tolerating Seroquel.  Doesn't like Seroquel bc she thinks it flattens here.  We discussed her ambivalence about it. She is starting Auvelity and has tolerated it the last 2 days without side effect.  She still does not feel like herself and feels flat and not enjoying  things with suppressed expressed emotion  04/17/2022 appointment with the following noted: Received Spravato 84 mg today as scheduled.  Tolerated it well without nausea or vomiting headache or chest pain or palpitations.  Her blood pressure was borderline but manageable. Has not seen any improvement so far.  Tolerating Seroquel.  Doesn't like Seroquel bc she thinks it flattens her. She has been tolerating the Auvelity 1 in the morning without side effects for about a week.  She has not noticed significant improvement so far.  She still feels depressed and flat and not herself.  Other people notice that she is flat emotionally.  She is not suicidal.  She does feel discouraged that she is not getting better yet.  04/19/2022 appointment noted: Has increased Auvelity to 1 twice daily for 2 days, continues quetiapine XR 300 mg nightly, clonidine 0.3 mg twice daily, lorazepam 1 mg twice daily for anxiety and Adderall XR 20 mg in the morning. No obious SE but she still thinks quetiapine XR is making her feel down.  But not sedated Received Spravato 84 mg today as scheduled.  Tolerated it well without nausea or vomiting headache or chest pain or palpitations.  Her blood pressure was borderline but manageable. She still feels quite anxious and feels it necessary to take both the clonidine and lorazepam twice a day to manage her anxiety.  She has been consistently down and flat and not herself until yesterday afternoon she noted an improvement in mood and feeling much more like herself with her normal personality reemerging.  She was quite depressed in the morning with very dark negative thoughts.  She did not have those dark negative thoughts this morning.  She had a lot of questions about medication and when she was expecting to be improved and why she has not shown improvement up to now.  04/23/22 appt noted: Has increased Auvelity to 1 twice daily for 1 week, continues quetiapine XR 300 mg nightly, clonidine 0.3  mg twice daily, lorazepam 1 mg twice daily for anxiety and Adderall XR 20 mg in the morning. No obious SE but she still thinks quetiapine XR is making her feel down.  But not sedated Received Spravato 84 mg today as scheduled.  Tolerated it well without nausea or vomiting headache or chest pain or palpitations.  She is still depressed but admits better function and is able to enjoy social interactions. Tolerating meds.  Would like to feel better for sure. Not herself.  Flat. Plan increase Auvelity to 1 tab BID as planned and reduce Quetiapine to 1/2 of ER 300 mg  bc NR for depression.  04/25/2022 appointment with the following noted: clonidine 0.3 mg twice daily, lorazepam 1 mg twice daily  for anxiety and Adderall XR 20 mg in the morning. Seroquel XR 300 HS No obious SE but she still thinks quetiapine XR is making her feel down.  But not sedated Received Spravato 84 mg today as scheduled.  Tolerated it well without nausea or vomiting headache or chest pain or palpitations.  Called yesterday with more anxiety.  Had increased Auvelity for 1 day and reduced Seroquel XR for 1 day.  Felt restless and fearful  05/01/2022 appointment noted: clonidine 0.3 mg twice daily, lorazepam 1 mg twice daily for anxiety and Adderall XR 20 mg in the morning. Seroquel XR 150 HS, Auvelity 1 BID Received Spravato 84 mg today as scheduled.  Tolerated it well without nausea or vomiting headache or chest pain or palpitations.  Nurse has noted patient has called multiple times sometimes asking the same question repeatedly.  It is unclear whether she is truly forgetful or is just anxious seeking reassurance. Patient acknowledges ongoing depression as well as some anxiety but states she has felt a little better in the last couple of days.  She has reduced the Seroquel to 150 mg at night and has increased Auvelity to 1 twice daily but only for 1 day.  So far she seems to be tolerating it.  05/03/22 appt noted: clonidine 0.2 mg  twice daily, lorazepam 1 mg twice daily for anxiety and Adderall XR 20 mg in the morning. Seroquel XR 150 HS, Auvelity 1 BID BP high this am about 170/100 and received extra clonidine 0.2 mg and came to receive Spravato.  Not dizzy, no SOB, nor CP but BP is still high Could not receive Spravato today bc BP high and pulse low at 30 ppm. Still depressed and anxious. Plan: continue trial Auvelity with Spravato She needs to get BP and pulse managed  05/08/22 TC: RTC  H Michael NA and mailbox full.  Could not leave message.  Pt  -  talked to she and H on speaker. H worried over wife.  Vacant stare.  Slurs words at times.  Not smiling. Reduced enjoyment.  Depression.  Withdrawn from usual activities.  Some irritability.  Anxious. Disc her concerns meds are making her worse.  Extensive discussion about her treatment resistant status.  There is a consistent pattern of not taking the medicines long enough to get benefit because she believes the meds are making her worse.  However the symptoms she describes as side effects are exactly the same symptoms that she had prior to taking the medication RX for  the depression.  So it is not clear that these are actual side effects. This is true about the 2 most recent meds including Seroquel and Auvelity.  Recommend psychiatric consultation in hopes of improving her comfort level with taking prescribed medications for a sufficient length of time to provide benefit. Extensive discussion about ECT is the treatment of choice for treatment resistant depression.  Spravato may work if she can comply with consistency.  There are medication options but they take longer to work.   Plan:  Reduce clonidine to 0.1 mg BID DT bradycardia.  Talk with PCP about BP and low pulse problems which are interfering with her consistent compliance with Spravato.   Limit lorazepam to 3 -4  mg daily max. Excess use is the cause of slurring speech.  She must stop excess use or will have to stop  the med. Stop Auvelity per her request.  But she has only been on the full dose for a little over  a week and clearly has not had time to get benefit from it.  She thinks maybe it is making her more anxious. Reduce Seroquel from 150XR to 50 -100 mg at night IR.  She couldn't sleep when stopped it completely. Will not start new antidepressant until her SE issues are resolved or not. Get second psych opinion from Chris Aiken MD or another psychiatrist.  H's sister is therapist in [place]  [place], MD, DFAPA  05/16/2022 appointment with the following noted: Received Spravato 84 mg today as scheduled.  Tolerated it well without nausea or vomiting headache or chest pain or palpitations.  She stopped Auvelity as discussed last week. On her own, without physician input, she restarted Wellbutrin XL 450 mg every morning today.  She had taken it in the past.  She feels jittery and anxious. She feels less depressed than she did last week.  But she is still depressed without her usual range of affect.  She still is less social and less motivated than normal. Her primary care doctor increased the dose of losartan Plan: Stop Seroquel Reduce Wellbutrin XL to 300 mg every morning.  Starting the dose at 450 every morning is likely causing side effects of jitteriness and it should not be started at that have a dosage. Recommend she not change meds on her own without MDM put  05/23/2022 appointment with the following noted: Received Spravato 84 mg today as scheduled.  Tolerated it well without nausea or vomiting headache or chest pain or palpitations.  Has not dropped seroquel XR 300 mg 1/2 tablet nightly bc couldn't sleep without it. Has not tried lower dose quetiapine 50 mg HS Still feels depressed.   BP is better managed so far, just saw PCP.  BP is better today and infact is low today. Dropped clonidine as directed from 0.3 mg BID bc inadequate control of BP to 0.2 mg BID.  However she wants to increase it  back to 0.3 mg twice daily because she feels it helped her anxiety better.  Wonders about increasing Wellbutrin for depression.  However she has only been on 300 mg a day for a week.  She was on 450 mg daily in the past.  06/06/22 appt noted: Received Spravato 84 mg today as scheduled.  Tolerated it well without nausea or vomiting headache or chest pain or palpitations.  She is still depressed and anxious.  She wants to try to stop the Seroquel but cannot sleep without some of it.  She is taking lorazepam 1 mg 4 times daily and still having a lot of anxiety.  She wants to increase clonidine back to 0.3 mg twice daily.  She hopes for more improvement She recently went for a second psychiatric opinion as suggested the results of that are pending.  06/11/22 appt noted: Received Spravato 84 mg today as scheduled.  Tolerated it well without nausea or vomiting headache or chest pain or palpitations.  She is still depressed and anxious. Without much change.  Still hopeless, anhedonia, reduced inteterest and motivation.  Tolerating meds. Disc concerns Spravato is not hleping much. Plan: stop Seroquel and start olanzapine 10 mg HS for TRD and anxiety.  06/13/2022 appointment noted: Received Spravato 84 mg today as scheduled.  Tolerated it well without nausea or vomiting headache or chest pain or palpitations.  She is still depressed and anxious. Without much change.  Still hopeless, anhedonia, reduced inteterest and motivation.  Tolerating meds. Disc concerns Spravato is not helping much as hoped but is   improving a bit in the last week. Tolerating meds. Continues Wellbutrin XL 450 AM, tolerating recently started olanzapine  10 mg HS. Sleep is good.   Pending appt with TMS consult.  06/18/22 appt noted: Received Spravato 84 mg today as scheduled.  Tolerated it well without nausea or vomiting headache or chest pain or palpitations.  Tolerating meds. Continues Wellbutrin XL 450 AM, tolerating recently started  olanzapine  10 mg HS. Continues Adderall XR 20 amd and has tried to reduce lorazepam to 1mg TID Sleep is good.   Pending appt with TMS consult. Depression is a little bit better in the last week with a little improvement in emotional expression and interest.  She is pushing herself to be more active.  Her daughter thought she was a little better than she has been.  However she is still depressed and still not her normal self with anhedonia and reduced emotional expressiveness.  06/20/22 appt noted: Received Spravato 84 mg today as scheduled.  Tolerated it well without nausea or vomiting headache or chest pain or palpitations.  Tolerating meds with a little sleepiness. Continues Wellbutrin XL 450 AM, tolerating recently started olanzapine  10 mg HS. Continues Adderall XR 20 amd and has tried to reduce lorazepam to 1mg TID Sleep is good.   Mood is improving.  Better funciton.  Anxiety is better with olanzapine. Still not herself and depression not gone with some anhedonia and social avoidance and feeling overwhelmed.  8/14 2023 received Spravato 84 mg 06/27/2022 received Spravato Spravato 84 mg 07/02/2022 received Spravato 84 mg 07/04/2022 received Spravato 84 mg  07/09/2022 appointment noted: Received Spravato 84 mg today as scheduled.  Tolerated it well without nausea or vomiting headache or chest pain or palpitations.  Expected dissociation and feels less depressed with resolution of negative emotions immediately after Spravato and then depression, anxiety creep back in. Continues meds Adderall XR 20 mg every morning, Wellbutrin XL 450 every morning, clonidine 0.1 mg twice daily, lorazepam 1 mg every 6 hours as needed, olanzapine increased from 7.5 to 10 mg nightly on  Tolerating meds.  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  She does note some increased appetite and is  somewhat concerned about that but has not gained significant amounts of weight. She has had the TMS consultation which was initially denied but she knows it can be appealed.  However because she is improving with Spravato plus the other medications now she wants to continue the current treatment plan.  07/18/22 appt noted: Continues meds Adderall XR 20 mg every morning, Wellbutrin XL 450 every morning, clonidine 0.1 mg twice daily, lorazepam 1 mg every 6 hours as needed, olanzapine increased from 7.5 to 10 mg nightly on 07/04/2022. Received Spravato 84 mg today as scheduled.  Tolerated it well without nausea or vomiting headache or chest pain or palpitations.  Expected dissociation and feels less depressed with resolution of negative emotions immediately after Spravato and then depression, anxiety creep back in. Continues meds Adderall XR 20 mg every morning, Wellbutrin XL 450 every morning, clonidine 0.1 mg twice daily, lorazepam 1 mg every 6 hours as needed, olanzapine increased from 7.5 to 10 mg nightly on  Tolerating meds.  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  She does note some increased appetite and   is somewhat concerned about that but has not gained significant amounts of weight. She has had the [place] consultation which was initially denied but she knows it can be appealed. She continues to have chronic ambivalence about psychiatric medicines and initially tends to blame her depressive symptoms such as decreased concentration and feeling flat on what ever medicine she currently is taking even though she had the same symptoms before the current medicines were started.  Then after discussion she does admit that her depressive symptoms are improved since adding olanzapine but still has those residual symptoms noted.  07/23/22 received Spravato 84 mg   07/30/2022 appointment  noted: Received Spravato 84 mg today as scheduled.  Tolerated it well without nausea or vomiting headache or chest pain or palpitations.  Expected dissociation and feels less depressed with resolution of negative emotions immediately after Spravato and then depression, anxiety creep back in. Continues meds Adderall XR 20 mg every morning, Wellbutrin XL 450 every morning, clonidine 0.1 mg twice daily, lorazepam 1 mg every 6 hours as needed, olanzapine increased from 7.5 to 10 mg nightly on  She has been inconsistent with olanzapine because she continues to be ambivalent about the medications in general and thinks that perhaps the 10 mg is making her feel blunted.  She continues to feel some depression.  She had a good day this week and but still feels somewhat depressed and persistently anxious. Plan: be consistent with olanzapine 10 mg HS for TRD and longer trial for potential benefit for anxiety.  Has not taken it consistently.  08/06/2022 appointment noted: Received Spravato 84 mg today as scheduled.  Tolerated it well without nausea or vomiting headache or chest pain or palpitations.  Expected dissociation and feels less depressed with resolution of negative emotions immediately after Spravato and then depression, anxiety creep back in. Continues meds Adderall XR 20 mg every morning, Wellbutrin XL 450 every morning, clonidine 0.1 mg twice daily, lorazepam 1 mg every 6 hours as needed, olanzapine i 10 mg nightly  She continues to feel depressed but is about 50% better with Spravato.  She is still not herself.  She still has anhedonia.  She still is not her able to engage socially in the typical ways.  She is not jovial and outgoing like normal.  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  She continues to feel that her personality is dampened down.  There is a question about whether it is  related to depression or medication. Plan: continue olanzapine 10 for longer trial for TRD and severe anxiety.  08/13/22 appt noted:  Received Spravato 84 mg today as scheduled.  Tolerated it well without nausea or vomiting headache or chest pain or palpitations.  Expected dissociation and feels less depressed with resolution of negative emotions immediately after Spravato and then depression, anxiety creep back in. Continues meds Adderall XR 20 mg every morning, Wellbutrin XL 450 every morning, clonidine 0.1 mg twice daily, lorazepam 1 mg every 6 hours as needed, olanzapine i 10 mg nightly  She still does not feel herself.  Still struggles with depression and low motivation and reduced social engagement and reduced interest and reduced emotional expression.  She is somewhat better with the medicines plus Spravato.  She still believes the Spravato makes her blunted and is not sure how much it helps her anxiety.  She can have good days when  her family is around and she is engaged.  She still wants to stop the olanzapine. She has apparently continued to take the trazodone despite having been told to stop it when she started olanzapine.  She feels like she needs the trazodone. Plan: DC olanzapine and Start nortriptyline 25 mg nightly and build up to 75 mg nightly and then check blood level.    08/27/2022 appointment noted: Received Spravato 84 mg today as scheduled.  Tolerated it well without nausea or vomiting headache or chest pain or palpitations.  Expected dissociation and feels less depressed with resolution of negative emotions immediately after Spravato and then depression, anxiety creep back in. Continues meds Adderall XR 20 mg every morning, Wellbutrin XL 450 every morning, clonidine 0.1 mg twice daily, lorazepam 1 mg every 6 hours as needed. Stopped olanzapine and started nortriptyline which she has taken for about a week is 75 mg nightly. So far she is tolerating the nortriptyline well with the  exception of some dry mouth and constipation which she is working to manage.  She does not feel substantially better better or different off the olanzapine.  No change in her sleep which is good.  Main concern currently in addition to the residual depression is anxiety which is somewhat situational with pending arch show.  She is worrying about it more than normal.  Says she is having to take lorazepam twice a day where she had been able to keep reduce it prior to this.  She still does not feel like herself with residual depression with less social interest and less of her usual buoyancy in personality.  She is flatter than normal.  Overall she still feels that the Spravato has been helpful at reducing the severity of the depression.  She is not suicidal. She has not heard anything about the TMS appeal as of yet.  09/05/2022 appointment noted: Received Spravato 84 mg today as scheduled.  Tolerated it well without nausea or vomiting headache or chest pain or palpitations.  Expected dissociation and feels less depressed with resolution of negative emotions immediately after Spravato and then depression, anxiety creep back in. Continues meds Adderall XR 20 mg every morning, Wellbutrin XL 450 every morning, clonidine 0.1 mg twice daily, lorazepam 1 mg every 6 hours as needed. Stopped olanzapine and started nortriptyline which she has taken for about 2 week is 75 mg nightly. Initially blood pressure was a little high causing delay in starting Spravato.  She admitted to feeling a little wound up.  She still experiences a little increase in depression if she goes longer than a week in between doses of Spravato.  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  09/10/22 appt noted: Received Spravato 84 mg today as scheduled.  Tolerated it well without nausea or vomiting headache or chest pain or palpitations.   Expected dissociation gradually resolved over the 2 hour observation period. She feels 50% less depressed with Spravto and wants to continue it.   Continues meds Adderall XR 20 mg every morning, Wellbutrin XL 450 every morning, clonidine 0.1 mg twice daily, lorazepam 1 mg every 6 hours as needed. Has started nortriptyline 75 mg nightly for about 3 weeks. Has not seen a significant difference with the addition of nortriptyline.  Tolerating it pretty well. She continues to have some degree of anhedonia and significant depression and anxiety.  Her daughters noticed that   she is more needy and calls more frequently.  She acknowledges this as well.  She is clearly still not herself. Plan: pramipexole off label and RX 0.25 mg BID  09/17/2022 appointment noted: Received Spravato 84 mg today as scheduled.  Tolerated it well without nausea or vomiting headache or chest pain or palpitations.  Expected dissociation gradually resolved over the 2 hour observation period. She feels 50% less depressed with Spravto and wants to continue it.   Continues meds Adderall XR 20 mg every morning, Wellbutrin XL 450 every morning, clonidine 0.1 mg twice daily, lorazepam 1 mg every 6 hours as needed. Has started nortriptyline 75 mg nightly for about 3 weeks and DT level 176, reduced to 50 mg HS early November. Still the same sx as noted last visit.  Tolerating meds.   Compliant.  Still depressed and family notices.  Has been able to participate in family interactions.  Some post-show let down and has to do detailed work which is hard for her bc ADD.  Sleep and eating well.  Energy OK but not great.  No SI.  Not cried in a year or so.  Clearly less depressed and hopeless than before the Spravato.  09/24/22 appt noted: Received Spravato 84 mg today as scheduled.  Tolerated it well without nausea or vomiting headache or chest pain or palpitations.  Expected dissociation gradually resolved over the 2 hour observation period. She  feels 50% less depressed with Spravto and wants to continue it.   Continues meds Adderall XR 20 mg every morning, Wellbutrin XL 450 every morning, clonidine 0.1 mg twice daily, lorazepam 1 mg every 6 hours as needed. Has started nortriptyline 75 mg nightly for about 3 weeks and DT level 176, reduced to 50 mg HS early November. Still the same sx as noted last visit.  Tolerating meds.   Compliant.  Still depressed and family notices.  Has been able to participate in family interactions.  Some post-show let down and has to do detailed work which is hard for her bc ADD.  Sleep and eating well.  Energy OK but not great.  No SI.  Not cried in a year or so.  Clearly less depressed and hopeless than before the Spravato. Is not making further progress generally.  Stuck with moderate depression  10/02/22 appt noted: Received Spravato 84 mg today as scheduled.  Tolerated it well without nausea or vomiting headache or chest pain or palpitations.  Expected dissociation gradually resolved over the 2 hour observation period. She feels 50% less depressed with Spravto and wants to continue it.   Continues meds Adderall XR 20 mg every morning, Wellbutrin XL 450 every morning, clonidine 0.1 mg twice daily, lorazepam 1 mg every 6 hours as needed. Has started nortriptyline 75 mg nightly for about 3 weeks and DT level 176, reduced to 50 mg HS early November. Still the same sx as noted last visit.  Tolerating meds.   Compliant.  Still depressed and family notices.  Has been able to participate in family interactions.  Some post-show let down and has to do detailed work which is hard for her bc ADD.  Sleep and eating well.  Energy OK but not great.  No SI.  Not cried in a year or so.  Clearly less depressed and hopeless than before the Spravato. Is not making further progress generally.  Stuck with moderate depression.  Is able to function pretty normally. Plan: trial pramipexole 0.25 mg BID off label for depression.      10/08/22 appt noted: Received Spravato 84 mg today as scheduled.  Tolerated it well without nausea or vomiting headache or chest pain or palpitations.  Expected dissociation gradually resolved over the 2 hour observation period. She feels 50% less depressed with Spravto and wants to continue it.   Continues meds Adderall XR 20 mg every morning, Wellbutrin XL 450 every morning, clonidine 0.1 mg twice daily, lorazepam 1 mg every 6 hours as needed. Has started nortriptyline 75 mg nightly for about 3 weeks and DT level 176, reduced to 50 mg HS early November. Still the same sx as noted last visit.  Tolerating meds.   Compliant.  Still depressed and family notices.  Has been able to participate in family interactions.  Some post-show let down and has to do detailed work which is hard for her bc ADD.  Sleep and eating well.  Energy OK but not great.  No SI.  Not cried in a year or so.  Clearly less depressed and hopeless than before the Spravato. Is not making further progress generally.  Stuck with moderate depression.  Behaved and felt pretty normally with family over for Thanksgiving. Doesn't see benefit or SE with pramipexole but thinks maybe it makes her worse. Plan:  increase pramipexole augmentation off label to 0.5 mg BID  10/15/2022 appointment noted: Received Spravato 84 mg today as scheduled.  Tolerated it well without nausea or vomiting headache or chest pain or palpitations.  Expected dissociation gradually resolved over the 2 hour observation period. She feels 50% less depressed with Spravto and wants to continue it.   Continues meds Adderall XR 20 mg every morning, Wellbutrin XL 450 every morning, clonidine 0.2 mg twice daily, lorazepam 1 mg every 6 hours as needed.  Trazodone 50 mg tablets 1-2 nightly as needed insomnia Has started nortriptyline 75 mg nightly for about 3 weeks and DT level 176, reduced to 50 mg HS early November. Recommended increase pramipexole 0.5 mg twice daily off  label for treatment resistant depression on 10/08/2022. She feels better motivated more active with pramipexole 0.5 mg twice daily.  She still is depressed but it is better.  We discussed the possibility of going up in the dose but did not change it.  10/22/2022 appointment noted: Received Spravato 84 mg today as scheduled.  Tolerated it well without nausea or vomiting headache or chest pain or palpitations.  Expected dissociation gradually resolved over the 2 hour observation period. She feels 50% less depressed with Spravto and wants to continue it.   Continues meds Adderall XR 20 mg every morning, Wellbutrin XL 450 every morning, clonidine 0.2 mg twice daily, lorazepam 1 mg every 8 hours as needed.  Trazodone 50 mg tablets 1-2 nightly as needed insomnia Has started nortriptyline 75 mg nightly for about 3 weeks and DT level 176, reduced to 50 mg HS early November. pramipexole 0.5 mg twice daily off label for treatment resistant depression on 10/08/2022. She is better motivated than she was.  She is journaling 3 pages a day.  She has started walking and has walked 5 days a week for 50 minutes for the last 2 weeks.  That is significantly helped her mood.  Her mood tends to be better when she interacts with family.  However she still has some periods of depression.  She is tolerating the medications well.  She still feels like her affect and confidence is not back to normal.   ECT-MADRS    Flowsheet Row Clinical Support from 08/06/2022 in [facility] Clinical Support from 07/04/2022 in [place] from 05/21/2022 in [place] Visit from 03/02/2022 in [facility]  MADRS Total Score _0 46        Past Psychiatric Medication Trials: fluoxetine, duloxetine, Viibryd, lamotrigine, Pristiq, sertraline, citalopram,  Trintellix anxious and SI Wellbutrin XL 450 [place] 1 dose  Adderall, Adderall XR,  Vyvanse, Ritalin, Strattera low dose NR Lorazepam Trazodone  Depakote,  lamotrigine cog complaints Lithium remotely Abilify 7.5  Vraylar 1.5 mg daily agitation and insomnia Rexulti insomnia Latuda 40 one dose, CO anxious and SI Seroquel XR 300 Olanzapine 10  At visit November 12, 2019. We discussed Patient developed an increasingly severe alcohol dependence problem since her last visit in January.  She went to [facility] and has had no alcohol since then except 1 day.  She never abused stimulants but they took her off the stimulants at [facility].  Her ADD was markedly worse.  The Wellbutrin did not help the ADD.   D history lamotrigine rash at 66 yo  Review of Systems:  Review of Systems  Constitutional:  Positive for fatigue.  Cardiovascular:  Negative for palpitations.  Musculoskeletal:  Positive for arthralgias and back pain. Negative for joint swelling.       SP hip surgery October 2020  Neurological:  Negative for dizziness and tremors.  Psychiatric/Behavioral:  Positive for decreased concentration and dysphoric mood. Negative for agitation, behavioral problems, confusion, hallucinations, self-injury, sleep disturbance and suicidal ideas. The patient is nervous/anxious. The patient is not hyperactive.     Medications: I have reviewed the patient's current medications.  Current Outpatient Medications  Medication Sig Dispense Refill   amLODipine (NORVASC) 2.5 MG tablet Take 2.5 mg by mouth daily.     buPROPion (WELLBUTRIN XL) 150 MG 24 hr tablet TAKE 3 TABLETS BY MOUTH DAILY 270 tablet 1   cloNIDine (CATAPRES) 0.2 MG tablet Take 1 tablet (0.2 mg total) by mouth 2 (two) times daily. 60 tablet 1   Dextromethorphan HBr 15 MG CAPS Take 1 capsule (15 mg total) by mouth every 12 (twelve) hours. 60 capsule 0   iron polysaccharides (NIFEREX) 150 MG capsule TAKE 1 CAPSULE BY MOUTH EVERY DAY 90 capsule 1   losartan (COZAAR) 50 MG tablet Take 50 mg by mouth daily.      nebivolol (BYSTOLIC) 2.5 MG tablet Take 2.5 mg by mouth daily.     nortriptyline (PAMELOR) 25 MG capsule Take 2 capsules (50 mg total) by mouth at bedtime. 180 capsule 0   SPRAVATO, 84 MG DOSE, 28 MG/DEVICE SOPK USE 3 SPRAYS IN EACH NOSTRIL TWICE A WEEK 3 each 3   traZODone (DESYREL) 50 MG tablet 1-2 TABLETS NIGHTLY AS NEEDED FOR SLEEP 60 tablet 1   amphetamine-dextroamphetamine (ADDERALL XR) 20 MG 24 hr capsule Take 1 capsule (20 mg total) by mouth every morning. 30 capsule 0   amphetamine-dextroamphetamine (ADDERALL XR) 20 MG 24 hr capsule Take 1 capsule (20 mg total) by mouth every morning. 30 capsule 0   LORazepam (ATIVAN) 1 MG tablet Take 1 tablet (1 mg total) by mouth every 8 (eight) hours as needed. for anxiety 90 tablet 0   pramipexole (MIRAPEX) 0.5 MG tablet Take 1 tablet (0.5 mg total) by mouth 3 (three) times daily. 90 tablet 0   No current facility-administered medications for this visit.    Medication Side Effects: None  Allergies:  Allergies  Allergen Reactions   Metronidazole Shortness Of Breath and Other (  See Comments)    Heart pounding   Ferrlecit [Na Ferric Gluc Cplx In Sucrose] Other (See Comments)    Infusion reaction 05/12/2019    Past Medical History:  Diagnosis Date   ADHD    Anemia    Anxiety    Arthritis    Depression    Heart murmur    i went to see a cardiologit slast eyar  and i had zero plaque,    PONV (postoperative nausea and vomiting)    Recovering alcoholic in remission ([place])     Family History  Problem Relation Age of Onset   Atrial fibrillation Mother    CAD Father     Past Medical History, Surgical history, Social history, and Family history were reviewed and updated as appropriate.   Please see review of systems for further details on the patient's review from today.   Objective:   Physical Exam:  There were no vitals taken for this visit.  Physical Exam Constitutional:      General: She is not in acute distress. Neurological:      Mental Status: She is alert and oriented to person, place, and time.     Coordination: Coordination normal.     Gait: Gait normal.  Psychiatric:        Attention and Perception: Attention and perception normal.        Mood and Affect: Mood is anxious and depressed. Affect is not labile, angry or tearful.        Speech: Speech is not rapid and pressured, slurred or tangential.        Behavior: Behavior is not slowed.        Thought Content: Thought content is not paranoid or delusional. Thought content does not include homicidal or suicidal ideation. Thought content does not include suicidal plan.        Cognition and Memory: Cognition normal. Memory is not impaired. She does not exhibit impaired recent memory.        Judgment: Judgment normal.     Comments: Insight intact. No auditory or visual hallucinations. No delusions.  Depression improving recently Affect better with more spontaneity  now vs before Spravato.  Still blunted No Sui intent plan      Lab Review:     Component Value Date/Time   NA 137 01/12/2021 1430   NA 140 11/18/2018 1544   K 3.8 01/12/2021 1430   CL 108 01/12/2021 1430   CO2 22 01/12/2021 1430   GLUCOSE 94 01/12/2021 1430   BUN 14 01/12/2021 1430   BUN 20 11/18/2018 1544   CREATININE 0.82 01/12/2021 1430   CALCIUM 8.9 01/12/2021 1430   PROT 6.6 01/12/2021 1430   ALBUMIN 3.9 01/12/2021 1430   AST 12 (L) 01/12/2021 1430   ALT 11 01/12/2021 1430   ALKPHOS 46 01/12/2021 1430   BILITOT 0.5 01/12/2021 1430   GFRNONAA >60 01/12/2021 1430   GFRAA >60 09/02/2019 0249   GFRAA >60 01/27/2019 0811       Component Value Date/Time   WBC 4.5 01/12/2021 1430   RBC 4.32 01/12/2021 1430   HGB 12.8 01/12/2021 1430   HGB 12.9 07/17/2019 0953   HCT 38.5 01/12/2021 1430   HCT 21.9 (L) 12/25/2018 1221   PLT 272 01/12/2021 1430   PLT 286 07/17/2019 0953   MCV 89.1 01/12/2021 1430   MCH 29.6 01/12/2021 1430   MCHC 33.2 01/12/2021 1430   RDW 12.4 01/12/2021  1430   LYMPHSABS 1.4 01/12/2021 1430  MONOABS 0.4 01/12/2021 1430   EOSABS 0.0 01/12/2021 1430   BASOSABS 0.0 01/12/2021 1430    No results found for: "POCLITH", "LITHIUM"   No results found for: "PHENYTOIN", "PHENOBARB", "VALPROATE", "CBMZ"   .res Assessment: Plan:    Recurrent major depression resistant to treatment (HCC) - Plan: pramipexole (MIRAPEX) 0.5 MG tablet  Generalized anxiety disorder - Plan: LORazepam (ATIVAN) 1 MG tablet  Attention deficit hyperactivity disorder (ADHD), predominantly inattentive type - Plan: amphetamine-dextroamphetamine (ADDERALL XR) 20 MG 24 hr capsule, amphetamine-dextroamphetamine (ADDERALL XR) 20 MG 24 hr capsule  Insomnia due to mental condition - Plan: LORazepam (ATIVAN) 1 MG tablet  Accelerated hypertension   She has treatment resistant major depression at this time.  Have  discussed some of her recent abnormal behaviors leading to this depressive episode getting worse which she says were associated with heavy use of delta 8 and not a manic episode.  She realizes now that that was not good for her.  She stopped all use of other drugs including those available over-the-counter such as delta 8 or any other THC related products.  She is no longer having any of those types of behaviors and instead is depressed.  She remains depressed with lack of interest and lack of feeling for things that normally she would have feelings about.  She has low motivation and energy.  She is difficulty making decisions.  She is sad and down.  She is less productive than usual.  Her concentration is poor.  She has high degree of anxiety as well.  Her personality is more flat than normal for her.. She has a very negative self-image and low self-esteem.  These are all uncharacteristic for her However all of these symptoms are partially improved through Spravato and the switch from Seroquel to olanzapine.  She is approximately 50% better.  Patient was administered Spravato  84 mg intranasally today.  The patient experienced the typical dissociation which gradually resolved over the 2-hour period of observation.  There were no complications.  Specifically the patient did not have nausea or vomiting or headache.  Blood pressures monitored at the 40-minute and 2-hour follow-up intervals.  Borderline high.  By the time the 2-hour observation period was met the patient was alert and oriented and able to exit without assistance.  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  See nursing note for further details.She wants to continue Spravato. We discussed discussed the side effects in detail as well as the protocol required to receive Spravato.   Failed multiple antidepressants.  Many of them were not actual failures but intolerances and it is unclear whether some of that was more connected with anxiety than true side effects.  1 example is the Latuda.  In general she does not want to try anything but an antidepressant but has failed all major categories of antidepressants except TCAs and MAO inhibitors which have not been tried until now. There is a consistent pattern of thinking she is worse with medication which is not consistent with objective evidence.    Her self assessment is clouded by her depression.  the next logical antidepressant would be to use a tricyclic antidepressant as she has not taken 1 of these medications before. Started nortriptyline 75 mg nightly.  Serum level 176.  Reduced to 50 mg HS  Discussed side effects in detail.  Needs more time to help. Extensive discussion previously about her ambivalence about meds and   missatributing sx of depression as SE of meds.    continue Wellbutrin XL 450 mg AM  Off label augmentation DM at a lower dose of 15 mg BID-defer  Instead increase pramipexole augmentation off label to 0.5 mg tID or 0.75 mg BID  Disc SE.  Looks like partial  response with this.  Started Spravato 84 mg twice weekly on 03/16/2022.  Now on weekly administration  Adderall  XR 20 mg AM   Ativan 1 mg 3 times daily as needed anxiety but try to cut it back. Is not ideal to use benzodiazepine with stimulant but because of the severity of her symptoms it has been necessary.  Hope to eventually eliminate the benzodiazepine.  Expected as her depression improves her anxiety will improve as well.  However lately her anxiety has been unmanageable.  We will expect that to improve as the depression improves.  She has headed insturctions to reduce this.  Continue clonidine 0.2 mg BID off label for anxiety and helps BP partially. BP is better controlled but not consistent.  Consider increasing amlodipine.  Also on losartan 50  Discussed potential benefits, risks, and side effects of stimulants with patient to include increased heart rate, palpitations, insomnia, increased anxiety, increased irritability, or decreased appetite.  Instructed patient to contact office if experiencing any significant tolerability issues. She wants to return to usual dose of Adderall for ADD bc of mor poor cognitive function with reduction.  Also discussed that depression will impair cognitive function.  [facility] consultation was initially denied .  She says she called [facility] about this but they have not scheduled an appt .Marland Kitchen   Consider consultation with [facility] if not better by New year.  Extensive discussion about her chronic ambivalence about psychiatric medicines and tendency to blame medicine for the symptoms of depression that she had prior to even starting the medications.  After discussing this at length she was able to acknowledge that that is factual.  She does not appear to be having any significant side effects with the current medications.  However so far not much benefit with nortriptyline despite adequate level.  Has Maintained sobriety  FU with Spravato weekly   Lynder Parents, MD,  [facility]     Please see After Visit Summary for patient specific instructions.  Future Appointments  Date Time Provider [place]  10/29/2022  9:30 AM Cottle, Billey Co., MD [facility] None  10/29/2022  9:30 AM CP-NURSE [facility] None               No orders of the defined types were placed in this encounter.      -------------------------------

## 2022-10-23 ENCOUNTER — Other Ambulatory Visit: Payer: Self-pay | Admitting: Psychiatry

## 2022-10-23 DIAGNOSIS — F5105 Insomnia due to other mental disorder: Secondary | ICD-10-CM

## 2022-10-23 DIAGNOSIS — F411 Generalized anxiety disorder: Secondary | ICD-10-CM

## 2022-10-23 NOTE — Progress Notes (Signed)
NURSES NOTE:   Patient arrived for her 59 th Spravato treatment. Pt is being treated for Treatment Resistant Depression, pt will be receiving 84 mg which will continue to be her maintenance dose, she receives weekly treatments.  Pt is also planning on [place] maybe the first of the year. Patient arrived and taken to treatment room. Confirmed she had a ride home which is her husband would be coming back to pick up pt when done and sometimes she needs to use [facility]pper if he is unable to pick her up. Pt's Spravato is ordered through [employer] and delivered to office, all Spravato medication is stored at doctors office per REMS/FDA guidelines. The medication is required to be locked behind two doors per FDA/REMS Protocol. Medication is also disposed of properly per regulations.      Pt is brighter today, feeling better. Lots going on with Christmas activities and that is keeping her busy. Began taking patient's vital signs at 9:45 AM 134/91, pulse 61, Pulse Ox 98%, Gave patient first dose 28 mg nasal spray, each nasal spray administered in each nostril as directed and waited 5 minutes between the second and third dose. All 3 doses given pt did not complain of any nausea/vomiting, given a cup of water due to the taste after the administration of Spravato.  She listens to [employer] with spa or relaxing music.  Checked 40 minute vitals at 11:10 AM, 128/83, pulse 45, Pulse Ox 94%. Explained she would be monitored for a total time of 120 minutes. Discharge vitals were taken at 12:15 PM 116/90 P 54, 99% Pulse Ox. Dr. Clovis Pu met with pt today and discussed her medication. I walked pt to elevator, where her husband met her for the ride home. Recommend she go home and sleep or just relax on the couch. No driving, no intense activities. Verbalized understanding. Nurse was with pt a total of 70 minutes for clinical. Pt is coming back next week, Monday, December 11th. Pt instructed to call office with any problems or  questions.      LOT [id number] EXP AUG 2025

## 2022-10-29 ENCOUNTER — Ambulatory Visit: Payer: 59

## 2022-10-29 ENCOUNTER — Ambulatory Visit (INDEPENDENT_AMBULATORY_CARE_PROVIDER_SITE_OTHER): Payer: 59 | Admitting: Psychiatry

## 2022-10-29 VITALS — BP 104/85 | HR 69

## 2022-10-29 DIAGNOSIS — F900 Attention-deficit hyperactivity disorder, predominantly inattentive type: Secondary | ICD-10-CM

## 2022-10-29 DIAGNOSIS — F5105 Insomnia due to other mental disorder: Secondary | ICD-10-CM

## 2022-10-29 DIAGNOSIS — F411 Generalized anxiety disorder: Secondary | ICD-10-CM | POA: Diagnosis not present

## 2022-10-29 DIAGNOSIS — F339 Major depressive disorder, recurrent, unspecified: Secondary | ICD-10-CM

## 2022-10-29 DIAGNOSIS — I10 Essential (primary) hypertension: Secondary | ICD-10-CM

## 2022-10-29 NOTE — Progress Notes (Signed)
[name] [id number] September 16, 1956 66 y.o.  Subjective:   Patient ID:  Laura Chang is a 66 y.o. (DOB 05-09-1956) female.  Chief Complaint:  Chief Complaint  Patient presents with   Follow-up   Depression   Anxiety   Fatigue   ADD     HPI Laura Chang presents to the office today for follow-up of depression and anxiety and ADD.  seen November 12, 2019.  Melted down in 2020.  Went to [employer] in July.  No withdrawal.  1 drink since then.  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 She was on no stimulant and no SSRI but was taking Strattera and Wellbutrin.  The following changes were made. Stop Strattera. OK restart stimulant bc severe ADD Restart Adderall 1 daily for a few days and if tolerated then restart 1 twice daily. If not tolerated reduce the dosage if needed. May need to stop Wellbutrin if not tolerating the stimulant.  Yes.  DC Wellbutrin Restart Prozac 20 mg daily.  February 2021 appointment with the following noted: Completed grant proposal.  Couldn't doit without Adderall.  Sold a bunch of work.   Adderall XR lasts about 3 pm.  Strength seems about right.  BP been OK.  Not jittery.   Stopped Wellbutrin but had no SE. Mood drastically better with grant proposal and back on fluoxetine.  Less depressed and lethargic.  No anxiety.  Cut back on coffee. Started back with devotions and stronger faith. Plan: Continue Prozac 20 mg daily. May have to increase the dose at some point in the future given that she usually was taking higher dosages but she is getting good response at this time. Restart Wellbutrin off label for ADD since can't get 2 ADDERALL daily. 150 mg daily then 300 mg daily. She can adjust the dose between 150 mg and 300 mg daily to get the optimal effect.   05/11/2020 appointment with the following noted: Has been inconsistent with Prozac and Wellbutrin. Not sure of the effect of Wellbutrin. Biggest deterrent in work is  anxiety.  Some of the work is conceptual and difficult at times.  Can feel she's not up to a project at times.  Overall is OK but would like a steadier benefit from stimulant.  Exhausted from managing concentration and keeping up with things from the day.  Loses things.  Not good keeping up with schedule. Overall productive and emotionally OK. Can feel Adderall wear off. Mood is better in summer and worse in the winter.   F died in 2023-10-21 and that is a loss. No SE Wellbutrin. Still attends AA meetings.  Real benefit from [place] last year. Recognizes effect of anemia on ADD and mood.  Had iron infusions last winter. Plan:  Wellbutrin off label for ADD since can't get 2 ADDERALL daily. 150 mg daily then 300 mg daily.  01/24/2021 appointment with following noted: Doing a program called [employer] mindfulness app since Xmas.  CBT app helped the depression.  App helped her focus better.  Lost sign weight. Writing a lot. Before Xmas felt depressed and started negative thinking worse, self denigrating. Not drinking. More isolated.   Recognizes mo is narcissist.    Didn't tell anyone she was born until 3 mos later.  M aloof and uninterested in pt.  Lied about her birthday.  Mo lack of affection even with pt's kids. Going to [place] for a year and it helped her to quit drinking. Also misses  kids being gone with a hole also. Plan: No med changes  05/04/2021 appointment with the following noted: Therapist Paula Pile thinks she's manic. Lost weight to 144#.   States she is still sleeping okay.  Admits she is hyper and recognizes that she is likely manic.  She feels great, euphoric with an increased sense of spiritual connectedness to God.  She has racing thoughts and talks fast and talks a lot and this is noted by her husband.  He thinks she is a bit hyper.  She has been able to maintain sobriety although she will have 1 glass of wine on special occasions but does not drink by herself.  She is not  drinking to excess.  She denies any dangerous impulsivity.  She is clearly not depressed and not particularly anxious.  She has no concerns about her medication and she has been compliant.  06/16/21 appt noted: So much better.  Going through a lot but the manic thing happened on top of it.  So much slower.  Didn't feel like losing anything with risperidone.  Likes the Adderalll at 10 mg. Some drowsiness in the AM and very drowsy from risperidone 2 mg HS. Prayer life is better. Handling stress better. Less depressed with risperidone. Still likes trazodone. Sleeps well. Plan: Reduce Prozac to 10 mg daily.  Consider stopping it because it can feel the mania however she is reluctant to do that because she fears relapse of depression. Reduce risperidone to 1.5 mg nightly due to side effects.  Discussed risk of worsening mania.  07/25/2021 appointment with the following noted: Misses the Adderall and hard to function without it. Depressed now. Heavy chest.  Anxious and guilty.  Body feels heavy.   Hates Wellbutrin.   Plan: Increase fluoxetine to 20 mg daily Add Abilify 1/2 of 15 mg tablet daily Wean wellbutrin by 1 tablet each week  bc she feels it is not helpful and DT polypharmacy Reduce risperidone to 1 daily for 1 week and stop it. Disc risk of mania. Increase Adderall to XR 20 mg AM  08/08/21 Much less depressed and starting to feel normal I feel a lot better. No SE.  Speech normal off risperidone. Sleeping OK on trazaodone and enough.   Noticed benefit from Adderall again. Plan: continue fluoxetine to 20 mg daily Continue Abilify 1/2 of 15 mg tablet daily for depression and mania continue Adderall to XR 20 mg AM  10/10/2021 phone call: Pt stated she feels like the Abilify should be decreased to 5mg.She said she is depressed but rational and not suicidal.She has an appt Monday and can wait until then if you prefer. MD response: Reduce the Abilify to 7.5 mg every other day.  We will meet on  10/16/2021 and decide what to do from there.  10/16/2021 appointment with the following noted: More depressed.  Most depressed I've ever been.  Just numb.  Sense of grief.   Thinks the manic episode was unlike anything else she ever had.  Doesn't want to medicate against it.  Don't enjoy people.  Easily overwhelmed.  Had some death thoughts but not suicidal.  Has been functional.  Feels better today after reducing Abilify to every other day but she is only been doing that for 3 days. A/P: Episode of post manic depression was explained. continue fluoxetine to 20 mg daily Hold Abilify for 1 week then resume Abilify 1/2 of 15 mg tablet every other day for depression and mania continue Adderall to XR 20 mg   AM  10/27/2021 appointment with the following noted: I'm doing so much better.  Handling the depression better. Better self talk and spiritual focus has helped.   Dep 6/10 manifesting as anxiety with low confidence.   F died 2  years ago and M 66 yo and is dependent . She is working hard to feel better but still feels depressed.  She almost feels like she has a little more anxiety since restarting Abilify every other day. Plan: continue fluoxetine to 20 mg daily DC Abilify .  Vrayalar 1.5 mg QOD to try to get rid of depression ASAP. continue Adderall to XR 20 mg AM  11/10/2021 appointment with the following noted: Busy with Xmas and it was fun with family but then a big let down.  Did well with it.  Functioned well with it.  Working hard on things with depression.  Not shutting down. Not sure but feels better today but yesterday was hard.  Difficulty dealing with mother.  She won't do anything to help herself.  Yesterday with her all day.  Won't do PT and has isolated herself.    Lack of confidence.   No SE with Vraylar.  11/24/21 urgent appointment appt noted: More and more depressed.   So anxious and doesn't want to be alone but can do so. No appetite. Hurts inside. Has had some fleeting  suicidal thoughts but would not act on them.  Tolerating meds. Has been consistent with Vraylar 1.5 mg every other day, fluoxetine 20 mg daily Plan: Increase Vraylar to 1.5 mg daily Change Prozac to Trintellix 10 mg daily. Discussed side effects of each continue Adderall to XR 20 mg AM  12/27/2021 appointment with the following noted: Not OK.  I feel less depressed but feels bat shit. Not sleeping well.  Extremely anxious. Off and on sleep. 3-4 hours of sleep.   Still having daily SI.  But also become obvious has so much to do.  Overwhelmed by tasks.   Needs anxiety meds to just function. Not more motivated.  Walked yesterday.   Feels afraid like in trouble but not irritable or angry. DC DT agitation Vraylar to 1.5 mg daily Change Prozac to Trintellix 10 mg daily. Hold Adderall to XR 20 mg AM Clonidine 0.1 1/2 tablet twice daily for 2 days and if needed for anxiety and sleep increase to 1 twice daily Ok temporary Ativan 1 mg 3 times daily as needed anxiety  01/05/22 appt noted: Off fluoxetine and  Trintellix.  Only on Ativan, trazodone and Adderall XR 20 plus added clonidine 0.1 mg BID Didn't think she needed to start Trintellix. Not taking Ativan.   Didn't like herself last week. Feels some better today. Wonders if the manic sx Not agitated.  Anxiety kind of calmed down.  A lot to be anxious about situationally.  $ stress. Concerns about downers with meds. Can't access normal personality. ? Lethargy and inability to talk as sE. Plan: Latuda 20-40 mg daily with food. Adderall to XR 20 mg AM Clonidine 0.1 1/2 tablet twice daily  reduce dose to be sure no SE Ok temporary Ativan 1 mg 3 times daily as needed anxiety  01/19/22 appt noted: Taking Latuda 20 mg daily.  Took 40 mg once and felt anxious and  SI Still depressed and not very reactive Anxiety mainly about the depression and fears of the future. She wants to revisit manic sx and thinks it was maybe bc taking delta 8 bc was  taking a lot of   it so still doesn't think she's classic bipolar. She wants to only take Prozac bc thinks Latuda is perpetuating depression. Says the delta 8 was very psychaedelic.  When not taking it was not manic.  Sleeping ok again.  Plan: Per her request DC Latuda 20-40 mg daily with food. She wants to continue Prozac alone AMA  Adderall to XR 20 mg AM Clonidine 0.1 1/2 tablet twice daily  reduce dose to be sure no SE Ok temporary Ativan 1 mg 3 times daily as needed anxiety  01/23/2022 phone call complaining of increased anxiety since stopping Latuda.  She will try increasing clonidine.  01/26/2022 phone call not feeling well and wanted to restart the Vraylar.  However notes indicate that had made her agitated therefore she was encouraged to pick up samples of Rexulti 1 mg and start that instead.  02/06/2022 phone call: Stating she felt the Rexulti was helping with depression but she was not sleeping well and obsessing over things.  She was encouraged to increase Rexulti to 2 mg daily and increase trazodone for sleep.  02/09/2022 appointment with the following noted: This was an urgent work in appointment No sleep last night with trazodone 100 mg HS Nothing really better depression or anxiety. Ruminating negative anxious thoughts. Did not tolerate Rexulti because it was causing insomnia.  Does not think it helped depression.  Lacks emotion that she should have.  Lacks her usual personality.  Some hopeless thoughts.  Some death thoughts.  Some suicidal thoughts without plan or intent Plan: DC Rexulti and Prozac & DC trazodone Adderall to XR 20 mg AM Clonidine 0.1 1/tablet twice daily  reduce dose to be sure no SE Ok temporary Ativan 1 mg 3 times daily as needed anxiety Start Seroquel XR 150 mg nightly  03/02/2022 appointment: Denise called back a few days after starting Seroquel stating it was making her more anxious and more depressed.  This seemed unlikely as this medicine rarely ever  causes anxiety.  She stopped the medication waited 3 days and called back still had anxiety and depression but thought perhaps the anxiety was a little better.  She did not want to take the Seroquel. She knew about the option of Spravato and wanted to pursue that. Now questions whether to return to Seroquel while waiting to start Spravato bc feels just as bad without it and knows she didn't give it enough time to work.   MADRS 46  ECT-MADRS    Flowsheet Row Clinical Support from 08/06/2022 in [facility] Clinical Support from 07/04/2022 in [facility] Clinical Support from 05/21/2022 in [facility] Office Visit from 03/02/2022 in [facility]  MADRS Total Score 29 15 27 46      03/14/22 appt noted: Pt received Spravato 56 mg first dose today with some dissociative sx which were not severe.  She was anxious prior to the administration and felt better after receiving lorazepam 1 mg.  No NV, or HA. Wants to continue Spravato. Ongoing depression and desperate to feel better.  I'm not myself DT deprsssion which is most severe in recent history.  Anhedonia.  Low motivation.  Social avoidance. Continues to think all recent med trials are making her worse.  Sleep ok with Seroquel.  03/16/22 appt noted: Received Spravato 84 mg for the first time.  some dissociative sx which were not severe.  She was anxious prior to the administration and felt better after receiving lorazepam 1 mg.  No NV, or HA. Wants   to continue Spravato.   Does not feel any better or different since the last appt.  Ongoing depression.  Ongoing depression and desperate to feel better.  I'm not myself DT deprsssion which is most severe in recent history.  Anhedonia.  Low motivation.  Social avoidance. Continues to think all recent med trials are making her worse.  Sleep ok with Seroquel.  Does not want to continue Seroquel for TRD.  03/20/2022 appointment noted: Came for  Spravato administration today.  However blood pressure was significantly elevated approximately 180/115.  She was given lorazepam 1 mg and clonidine 0.2 mg to try to get it down. She states she regretted stopping the Seroquel XR 300 mg tablets.  She now realizes it was helpful.  She did not sleep much at all last night.  She did not take the Adderall this morning. 2 to 3 hours after arrival blood pressure was still elevated at  170/110, 62 pulse.  For Spravato administration was canceled for today.  She admits to being anxious and depressed.  She is not suicidal.  She is highly motivated to receive the Spravato.  We discussed getting it tomorrow.  03/22/2022 appointment noted: Patient's blood pressure was never stable enough yesterday in order to get her in for Spravato administration.  She was encouraged to see her primary care doctor.  It is better today.  03/26/2022 appointment with the following noted: Blood pressure was better.  Saw her primary care doctor who started on oral Bystolic 2.5 mg daily. Received Spravato 84 mg today as scheduled.  Tolerated it well without nausea or vomiting headache or chest pain or palpitations.  Her blood pressure was borderline but manageable. She remains depressed and anxious.  She is ambivalent about the medicine and desperate to get to feel better.  Continues to have anhedonia and low energy and low motivation and reduced ability to do things.  Less social.  Not suicidal.  03/28/22 appt noted: Received Spravato 84 mg today as scheduled.  Tolerated it well without nausea or vomiting headache or chest pain or palpitations.  Her blood pressure was borderline but manageable. Has not seen any improvement so far.  Tolerating Seroquel.  Inconsistent with Bystolic and BP has been borderline high. Still depressed and anxious and anhedonia.  Low motivation, energy, productivity. Taking quetiapine and tolerating XR 300 mg nightly.  04/04/22 appt noted: Received Spravato  84 mg today as scheduled.  Tolerated it well without nausea or vomiting headache or chest pain or palpitations.  Her blood pressure was borderline but manageable. Has not seen any improvement so far.  Tolerating Seroquel.   She still tends to think that the medications are making her worse.  She has said this about each of the recent psychiatric medicines including Seroquel.  However her husband thinks she is improved.  She also admits there is some improvement in productivity.  She still feels highly anxious.  She still does not enjoy things as normal.  She still feels desperate to improve as soon as possible. Has been taking Seroquel XR since 03/20/2022  04/10/22 appt noted: Received Spravato 84 mg today as scheduled.  Tolerated it well without nausea or vomiting headache or chest pain or palpitations.  Her blood pressure was borderline but manageable. Has not seen any improvement so far.  Tolerating Seroquel.  Doesn't like Seroquel bc she thinks it flattens here. Ongoing depression without confidence Plan: Start Auvelity 1 every morning for persistent treatment resistant depression  04/12/2022 appointment with the following noted:  Received Spravato 84 mg today as scheduled.  Tolerated it well without nausea or vomiting headache or chest pain or palpitations.  Her blood pressure was borderline but manageable. Has not seen any improvement so far.  Tolerating Seroquel.  Doesn't like Seroquel bc she thinks it flattens her. Received Spravato 84 mg today as scheduled.  Tolerated it well without nausea or vomiting headache or chest pain or palpitations.  Her blood pressure was borderline but manageable. Has not seen any improvement so far.  Tolerating Seroquel.  Doesn't like Seroquel bc she thinks it flattens here.  We discussed her ambivalence about it. She is starting Auvelity and has tolerated it the last 2 days without side effect.  She still does not feel like herself and feels flat and not enjoying  things with suppressed expressed emotion  04/17/2022 appointment with the following noted: Received Spravato 84 mg today as scheduled.  Tolerated it well without nausea or vomiting headache or chest pain or palpitations.  Her blood pressure was borderline but manageable. Has not seen any improvement so far.  Tolerating Seroquel.  Doesn't like Seroquel bc she thinks it flattens her. She has been tolerating the Auvelity 1 in the morning without side effects for about a week.  She has not noticed significant improvement so far.  She still feels depressed and flat and not herself.  Other people notice that she is flat emotionally.  She is not suicidal.  She does feel discouraged that she is not getting better yet.  04/19/2022 appointment noted: Has increased Auvelity to 1 twice daily for 2 days, continues quetiapine XR 300 mg nightly, clonidine 0.3 mg twice daily, lorazepam 1 mg twice daily for anxiety and Adderall XR 20 mg in the morning. No obious SE but she still thinks quetiapine XR is making her feel down.  But not sedated Received Spravato 84 mg today as scheduled.  Tolerated it well without nausea or vomiting headache or chest pain or palpitations.  Her blood pressure was borderline but manageable. She still feels quite anxious and feels it necessary to take both the clonidine and lorazepam twice a day to manage her anxiety.  She has been consistently down and flat and not herself until yesterday afternoon she noted an improvement in mood and feeling much more like herself with her normal personality reemerging.  She was quite depressed in the morning with very dark negative thoughts.  She did not have those dark negative thoughts this morning.  She had a lot of questions about medication and when she was expecting to be improved and why she has not shown improvement up to now.  04/23/22 appt noted: Has increased Auvelity to 1 twice daily for 1 week, continues quetiapine XR 300 mg nightly, clonidine 0.3  mg twice daily, lorazepam 1 mg twice daily for anxiety and Adderall XR 20 mg in the morning. No obious SE but she still thinks quetiapine XR is making her feel down.  But not sedated Received Spravato 84 mg today as scheduled.  Tolerated it well without nausea or vomiting headache or chest pain or palpitations.  She is still depressed but admits better function and is able to enjoy social interactions. Tolerating meds.  Would like to feel better for sure. Not herself.  Flat. Plan increase Auvelity to 1 tab BID as planned and reduce Quetiapine to 1/2 of ER 300 mg  bc NR for depression.  04/25/2022 appointment with the following noted: clonidine 0.3 mg twice daily, lorazepam 1 mg twice daily   for anxiety and Adderall XR 20 mg in the morning. Seroquel XR 300 HS No obious SE but she still thinks quetiapine XR is making her feel down.  But not sedated Received Spravato 84 mg today as scheduled.  Tolerated it well without nausea or vomiting headache or chest pain or palpitations.  Called yesterday with more anxiety.  Had increased Auvelity for 1 day and reduced Seroquel XR for 1 day.  Felt restless and fearful  05/01/2022 appointment noted: clonidine 0.3 mg twice daily, lorazepam 1 mg twice daily for anxiety and Adderall XR 20 mg in the morning. Seroquel XR 150 HS, Auvelity 1 BID Received Spravato 84 mg today as scheduled.  Tolerated it well without nausea or vomiting headache or chest pain or palpitations.  Nurse has noted patient has called multiple times sometimes asking the same question repeatedly.  It is unclear whether she is truly forgetful or is just anxious seeking reassurance. Patient acknowledges ongoing depression as well as some anxiety but states she has felt a little better in the last couple of days.  She has reduced the Seroquel to 150 mg at night and has increased Auvelity to 1 twice daily but only for 1 day.  So far she seems to be tolerating it.  05/03/22 appt noted: clonidine 0.2 mg  twice daily, lorazepam 1 mg twice daily for anxiety and Adderall XR 20 mg in the morning. Seroquel XR 150 HS, Auvelity 1 BID BP high this am about 170/100 and received extra clonidine 0.2 mg and came to receive Spravato.  Not dizzy, no SOB, nor CP but BP is still high Could not receive Spravato today bc BP high and pulse low at 30 ppm. Still depressed and anxious. Plan: continue trial Auvelity with Spravato She needs to get BP and pulse managed  05/08/22 TC: RTC  H Michael NA and mailbox full.  Could not leave message.  Pt  -  talked to she and H on speaker. H worried over wife.  Vacant stare.  Slurs words at times.  Not smiling. Reduced enjoyment.  Depression.  Withdrawn from usual activities.  Some irritability.  Anxious. Disc her concerns meds are making her worse.  Extensive discussion about her treatment resistant status.  There is a consistent pattern of not taking the medicines long enough to get benefit because she believes the meds are making her worse.  However the symptoms she describes as side effects are exactly the same symptoms that she had prior to taking the medication RX for  the depression.  So it is not clear that these are actual side effects. This is true about the 2 most recent meds including Seroquel and Auvelity.  Recommend psychiatric consultation in hopes of improving her comfort level with taking prescribed medications for a sufficient length of time to provide benefit. Extensive discussion about ECT is the treatment of choice for treatment resistant depression.  Spravato may work if she can comply with consistency.  There are medication options but they take longer to work.   Plan:  Reduce clonidine to 0.1 mg BID DT bradycardia.  Talk with PCP about BP and low pulse problems which are interfering with her consistent compliance with Spravato.   Limit lorazepam to 3 -4  mg daily max. Excess use is the cause of slurring speech.  She must stop excess use or will have to stop  the med. Stop Auvelity per her request.  But she has only been on the full dose for a little over  a week and clearly has not had time to get benefit from it.  She thinks maybe it is making her more anxious. Reduce Seroquel from 150XR to 50 -100 mg at night IR.  She couldn't sleep when stopped it completely. Will not start new antidepressant until her SE issues are resolved or not. Get second psych opinion from Chris Aiken MD or another psychiatrist.  H's sister is therapist in [place] Arduini Cottle, MD, DFAPA  05/16/2022 appointment with the following noted: Received Spravato 84 mg today as scheduled.  Tolerated it well without nausea or vomiting headache or chest pain or palpitations.  She stopped Auvelity as discussed last week. On her own, without physician input, she restarted Wellbutrin XL 450 mg every morning today.  She had taken it in the past.  She feels jittery and anxious. She feels less depressed than she did last week.  But she is still depressed without her usual range of affect.  She still is less social and less motivated than normal. Her primary care doctor increased the dose of losartan Plan: Stop Seroquel Reduce Wellbutrin XL to 300 mg every morning.  Starting the dose at 450 every morning is likely causing side effects of jitteriness and it should not be started at that have a dosage. Recommend she not change meds on her own without MDM put  05/23/2022 appointment with the following noted: Received Spravato 84 mg today as scheduled.  Tolerated it well without nausea or vomiting headache or chest pain or palpitations.  Has not dropped seroquel XR 300 mg 1/2 tablet nightly bc couldn't sleep without it. Has not tried lower dose quetiapine 50 mg HS Still feels depressed.   BP is better managed so far, just saw PCP.  BP is better today and infact is low today. Dropped clonidine as directed from 0.3 mg BID bc inadequate control of BP to 0.2 mg BID.  However she wants to increase it  back to 0.3 mg twice daily because she feels it helped her anxiety better.  Wonders about increasing Wellbutrin for depression.  However she has only been on 300 mg a day for a week.  She was on 450 mg daily in the past.  06/06/22 appt noted: Received Spravato 84 mg today as scheduled.  Tolerated it well without nausea or vomiting headache or chest pain or palpitations.  She is still depressed and anxious.  She wants to try to stop the Seroquel but cannot sleep without some of it.  She is taking lorazepam 1 mg 4 times daily and still having a lot of anxiety.  She wants to increase clonidine back to 0.3 mg twice daily.  She hopes for more improvement She recently went for a second psychiatric opinion as suggested the results of that are pending.  06/11/22 appt noted: Received Spravato 84 mg today as scheduled.  Tolerated it well without nausea or vomiting headache or chest pain or palpitations.  She is still depressed and anxious. Without much change.  Still hopeless, anhedonia, reduced inteterest and motivation.  Tolerating meds. Disc concerns Spravato is not hleping much. Plan: stop Seroquel and start olanzapine 10 mg HS for TRD and anxiety.  06/13/2022 appointment noted: Received Spravato 84 mg today as scheduled.  Tolerated it well without nausea or vomiting headache or chest pain or palpitations.  She is still depressed and anxious. Without much change.  Still hopeless, anhedonia, reduced inteterest and motivation.  Tolerating meds. Disc concerns Spravato is not helping much as hoped but is   improving a bit in the last week. Tolerating meds. Continues Wellbutrin XL 450 AM, tolerating recently started olanzapine  10 mg HS. Sleep is good.   Pending appt with TMS consult.  06/18/22 appt noted: Received Spravato 84 mg today as scheduled.  Tolerated it well without nausea or vomiting headache or chest pain or palpitations.  Tolerating meds. Continues Wellbutrin XL 450 AM, tolerating recently started  olanzapine  10 mg HS. Continues Adderall XR 20 amd and has tried to reduce lorazepam to 1mg TID Sleep is good.   Pending appt with TMS consult. Depression is a little bit better in the last week with a little improvement in emotional expression and interest.  She is pushing herself to be more active.  Her daughter thought she was a little better than she has been.  However she is still depressed and still not her normal self with anhedonia and reduced emotional expressiveness.  06/20/22 appt noted: Received Spravato 84 mg today as scheduled.  Tolerated it well without nausea or vomiting headache or chest pain or palpitations.  Tolerating meds with a little sleepiness. Continues Wellbutrin XL 450 AM, tolerating recently started olanzapine  10 mg HS. Continues Adderall XR 20 amd and has tried to reduce lorazepam to 1mg TID Sleep is good.   Mood is improving.  Better funciton.  Anxiety is better with olanzapine. Still not herself and depression not gone with some anhedonia and social avoidance and feeling overwhelmed.  8/14 2023 received Spravato 84 mg 06/27/2022 received Spravato Spravato 84 mg 07/02/2022 received Spravato 84 mg 07/04/2022 received Spravato 84 mg  07/09/2022 appointment noted: Received Spravato 84 mg today as scheduled.  Tolerated it well without nausea or vomiting headache or chest pain or palpitations.  Expected dissociation and feels less depressed with resolution of negative emotions immediately after Spravato and then depression, anxiety creep back in. Continues meds Adderall XR 20 mg every morning, Wellbutrin XL 450 every morning, clonidine 0.1 mg twice daily, lorazepam 1 mg every 6 hours as needed, olanzapine increased from 7.5 to 10 mg nightly on  Tolerating meds.  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  She does note some increased appetite and is  somewhat concerned about that but has not gained significant amounts of weight. She has had the TMS consultation which was initially denied but she knows it can be appealed.  However because she is improving with Spravato plus the other medications now she wants to continue the current treatment plan.  07/18/22 appt noted: Continues meds Adderall XR 20 mg every morning, Wellbutrin XL 450 every morning, clonidine 0.1 mg twice daily, lorazepam 1 mg every 6 hours as needed, olanzapine increased from 7.5 to 10 mg nightly on 07/04/2022. Received Spravato 84 mg today as scheduled.  Tolerated it well without nausea or vomiting headache or chest pain or palpitations.  Expected dissociation and feels less depressed with resolution of negative emotions immediately after Spravato and then depression, anxiety creep back in. Continues meds Adderall XR 20 mg every morning, Wellbutrin XL 450 every morning, clonidine 0.1 mg twice daily, lorazepam 1 mg every 6 hours as needed, olanzapine increased from 7.5 to 10 mg nightly on  Tolerating meds.  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  She does note some increased appetite and   is somewhat concerned about that but has not gained significant amounts of weight. She has had the [place] consultation which was initially denied but she knows it can be appealed. She continues to have chronic ambivalence about psychiatric medicines and initially tends to blame her depressive symptoms such as decreased concentration and feeling flat on what ever medicine she currently is taking even though she had the same symptoms before the current medicines were started.  Then after discussion she does admit that her depressive symptoms are improved since adding olanzapine but still has those residual symptoms noted.  07/23/22 received Spravato 84 mg   07/30/2022 appointment  noted: Received Spravato 84 mg today as scheduled.  Tolerated it well without nausea or vomiting headache or chest pain or palpitations.  Expected dissociation and feels less depressed with resolution of negative emotions immediately after Spravato and then depression, anxiety creep back in. Continues meds Adderall XR 20 mg every morning, Wellbutrin XL 450 every morning, clonidine 0.1 mg twice daily, lorazepam 1 mg every 6 hours as needed, olanzapine increased from 7.5 to 10 mg nightly on  She has been inconsistent with olanzapine because she continues to be ambivalent about the medications in general and thinks that perhaps the 10 mg is making her feel blunted.  She continues to feel some depression.  She had a good day this week and but still feels somewhat depressed and persistently anxious. Plan: be consistent with olanzapine 10 mg HS for TRD and longer trial for potential benefit for anxiety.  Has not taken it consistently.  08/06/2022 appointment noted: Received Spravato 84 mg today as scheduled.  Tolerated it well without nausea or vomiting headache or chest pain or palpitations.  Expected dissociation and feels less depressed with resolution of negative emotions immediately after Spravato and then depression, anxiety creep back in. Continues meds Adderall XR 20 mg every morning, Wellbutrin XL 450 every morning, clonidine 0.1 mg twice daily, lorazepam 1 mg every 6 hours as needed, olanzapine i 10 mg nightly  She continues to feel depressed but is about 50% better with Spravato.  She is still not herself.  She still has anhedonia.  She still is not her able to engage socially in the typical ways.  She is not jovial and outgoing like normal.  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  She continues to feel that her personality is dampened down.  There is a question about whether it is  related to depression or medication. Plan: continue olanzapine 10 for longer trial for TRD and severe anxiety.  08/13/22 appt noted:  Received Spravato 84 mg today as scheduled.  Tolerated it well without nausea or vomiting headache or chest pain or palpitations.  Expected dissociation and feels less depressed with resolution of negative emotions immediately after Spravato and then depression, anxiety creep back in. Continues meds Adderall XR 20 mg every morning, Wellbutrin XL 450 every morning, clonidine 0.1 mg twice daily, lorazepam 1 mg every 6 hours as needed, olanzapine i 10 mg nightly  She still does not feel herself.  Still struggles with depression and low motivation and reduced social engagement and reduced interest and reduced emotional expression.  She is somewhat better with the medicines plus Spravato.  She still believes the Spravato makes her blunted and is not sure how much it helps her anxiety.  She can have good days when  her family is around and she is engaged.  She still wants to stop the olanzapine. She has apparently continued to take the trazodone despite having been told to stop it when she started olanzapine.  She feels like she needs the trazodone. Plan: DC olanzapine and Start nortriptyline 25 mg nightly and build up to 75 mg nightly and then check blood level.    08/27/2022 appointment noted: Received Spravato 84 mg today as scheduled.  Tolerated it well without nausea or vomiting headache or chest pain or palpitations.  Expected dissociation and feels less depressed with resolution of negative emotions immediately after Spravato and then depression, anxiety creep back in. Continues meds Adderall XR 20 mg every morning, Wellbutrin XL 450 every morning, clonidine 0.1 mg twice daily, lorazepam 1 mg every 6 hours as needed. Stopped olanzapine and started nortriptyline which she has taken for about a week is 75 mg nightly. So far she is tolerating the nortriptyline well with the  exception of some dry mouth and constipation which she is working to manage.  She does not feel substantially better better or different off the olanzapine.  No change in her sleep which is good.  Main concern currently in addition to the residual depression is anxiety which is somewhat situational with pending arch show.  She is worrying about it more than normal.  Says she is having to take lorazepam twice a day where she had been able to keep reduce it prior to this.  She still does not feel like herself with residual depression with less social interest and less of her usual buoyancy in personality.  She is flatter than normal.  Overall she still feels that the Spravato has been helpful at reducing the severity of the depression.  She is not suicidal. She has not heard anything about the TMS appeal as of yet.  09/05/2022 appointment noted: Received Spravato 84 mg today as scheduled.  Tolerated it well without nausea or vomiting headache or chest pain or palpitations.  Expected dissociation and feels less depressed with resolution of negative emotions immediately after Spravato and then depression, anxiety creep back in. Continues meds Adderall XR 20 mg every morning, Wellbutrin XL 450 every morning, clonidine 0.1 mg twice daily, lorazepam 1 mg every 6 hours as needed. Stopped olanzapine and started nortriptyline which she has taken for about 2 week is 75 mg nightly. Initially blood pressure was a little high causing delay in starting Spravato.  She admitted to feeling a little wound up.  She still experiences a little increase in depression if she goes longer than a week in between doses of Spravato.  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  09/10/22 appt noted: Received Spravato 84 mg today as scheduled.  Tolerated it well without nausea or vomiting headache or chest pain or palpitations.   Expected dissociation gradually resolved over the 2 hour observation period. She feels 50% less depressed with Spravto and wants to continue it.   Continues meds Adderall XR 20 mg every morning, Wellbutrin XL 450 every morning, clonidine 0.1 mg twice daily, lorazepam 1 mg every 6 hours as needed. Has started nortriptyline 75 mg nightly for about 3 weeks. Has not seen a significant difference with the addition of nortriptyline.  Tolerating it pretty well. She continues to have some degree of anhedonia and significant depression and anxiety.  Her daughters noticed that   she is more needy and calls more frequently.  She acknowledges this as well.  She is clearly still not herself. Plan: pramipexole off label and RX 0.25 mg BID  09/17/2022 appointment noted: Received Spravato 84 mg today as scheduled.  Tolerated it well without nausea or vomiting headache or chest pain or palpitations.  Expected dissociation gradually resolved over the 2 hour observation period. She feels 50% less depressed with Spravto and wants to continue it.   Continues meds Adderall XR 20 mg every morning, Wellbutrin XL 450 every morning, clonidine 0.1 mg twice daily, lorazepam 1 mg every 6 hours as needed. Has started nortriptyline 75 mg nightly for about 3 weeks and DT level 176, reduced to 50 mg HS early November. Still the same sx as noted last visit.  Tolerating meds.   Compliant.  Still depressed and family notices.  Has been able to participate in family interactions.  Some post-show let down and has to do detailed work which is hard for her bc ADD.  Sleep and eating well.  Energy OK but not great.  No SI.  Not cried in a year or so.  Clearly less depressed and hopeless than before the Spravato.  09/24/22 appt noted: Received Spravato 84 mg today as scheduled.  Tolerated it well without nausea or vomiting headache or chest pain or palpitations.  Expected dissociation gradually resolved over the 2 hour observation period. She  feels 50% less depressed with Spravto and wants to continue it.   Continues meds Adderall XR 20 mg every morning, Wellbutrin XL 450 every morning, clonidine 0.1 mg twice daily, lorazepam 1 mg every 6 hours as needed. Has started nortriptyline 75 mg nightly for about 3 weeks and DT level 176, reduced to 50 mg HS early November. Still the same sx as noted last visit.  Tolerating meds.   Compliant.  Still depressed and family notices.  Has been able to participate in family interactions.  Some post-show let down and has to do detailed work which is hard for her bc ADD.  Sleep and eating well.  Energy OK but not great.  No SI.  Not cried in a year or so.  Clearly less depressed and hopeless than before the Spravato. Is not making further progress generally.  Stuck with moderate depression  10/02/22 appt noted: Received Spravato 84 mg today as scheduled.  Tolerated it well without nausea or vomiting headache or chest pain or palpitations.  Expected dissociation gradually resolved over the 2 hour observation period. She feels 50% less depressed with Spravto and wants to continue it.   Continues meds Adderall XR 20 mg every morning, Wellbutrin XL 450 every morning, clonidine 0.1 mg twice daily, lorazepam 1 mg every 6 hours as needed. Has started nortriptyline 75 mg nightly for about 3 weeks and DT level 176, reduced to 50 mg HS early November. Still the same sx as noted last visit.  Tolerating meds.   Compliant.  Still depressed and family notices.  Has been able to participate in family interactions.  Some post-show let down and has to do detailed work which is hard for her bc ADD.  Sleep and eating well.  Energy OK but not great.  No SI.  Not cried in a year or so.  Clearly less depressed and hopeless than before the Spravato. Is not making further progress generally.  Stuck with moderate depression.  Is able to function pretty normally. Plan: trial pramipexole 0.25 mg BID off label for depression.      10/08/22 appt noted: Received Spravato 84 mg today as scheduled.  Tolerated it well without nausea or vomiting headache or chest pain or palpitations.  Expected dissociation gradually resolved over the 2 hour observation period. She feels 50% less depressed with Spravto and wants to continue it.   Continues meds Adderall XR 20 mg every morning, Wellbutrin XL 450 every morning, clonidine 0.1 mg twice daily, lorazepam 1 mg every 6 hours as needed. Has started nortriptyline 75 mg nightly for about 3 weeks and DT level 176, reduced to 50 mg HS early November. Still the same sx as noted last visit.  Tolerating meds.   Compliant.  Still depressed and family notices.  Has been able to participate in family interactions.  Some post-show let down and has to do detailed work which is hard for her bc ADD.  Sleep and eating well.  Energy OK but not great.  No SI.  Not cried in a year or so.  Clearly less depressed and hopeless than before the Spravato. Is not making further progress generally.  Stuck with moderate depression.  Behaved and felt pretty normally with family over for Thanksgiving. Doesn't see benefit or SE with pramipexole but thinks maybe it makes her worse. Plan:  increase pramipexole augmentation off label to 0.5 mg BID  10/15/2022 appointment noted: Received Spravato 84 mg today as scheduled.  Tolerated it well without nausea or vomiting headache or chest pain or palpitations.  Expected dissociation gradually resolved over the 2 hour observation period. She feels 50% less depressed with Spravto and wants to continue it.   Continues meds Adderall XR 20 mg every morning, Wellbutrin XL 450 every morning, clonidine 0.2 mg twice daily, lorazepam 1 mg every 6 hours as needed.  Trazodone 50 mg tablets 1-2 nightly as needed insomnia Has started nortriptyline 75 mg nightly for about 3 weeks and DT level 176, reduced to 50 mg HS early November. Recommended increase pramipexole 0.5 mg twice daily off  label for treatment resistant depression on 10/08/2022. She feels better motivated more active with pramipexole 0.5 mg twice daily.  She still is depressed but it is better.  We discussed the possibility of going up in the dose but did not change it.  10/22/2022 appointment noted: Received Spravato 84 mg today as scheduled.  Tolerated it well without nausea or vomiting headache or chest pain or palpitations.  Expected dissociation gradually resolved over the 2 hour observation period. She feels 50% less depressed with Spravto and wants to continue it.   Continues meds Adderall XR 20 mg every morning, Wellbutrin XL 450 every morning, clonidine 0.2 mg twice daily, lorazepam 1 mg every 8 hours as needed.  Trazodone 50 mg tablets 1-2 nightly as needed insomnia Has started nortriptyline 75 mg nightly for about 3 weeks and DT level 176, reduced to 50 mg HS early November. pramipexole 0.5 mg twice daily off label for treatment resistant depression on 10/08/2022. She is better motivated than she was.  She is journaling 3 pages a day.  She has started walking and has walked 5 days a week for 50 minutes for the last 2 weeks.  That is significantly helped her mood.  Her mood tends to be better when she interacts with family.  However she still has some periods of depression.  She is tolerating the medications well.  She still feels like her affect and confidence is not back to normal. Plan:  increase pramipexole augmentation off label to 0.5 mg tID or 0.75 mg  BID   10/29/22 appt noted: Received Spravato 84 mg today as scheduled.  Tolerated it well without nausea or vomiting headache or chest pain or palpitations.  Expected dissociation gradually resolved over the 2 hour observation period. She feels 50% less depressed with Spravto and wants to continue it.   Continues meds Adderall XR 20 mg every morning, Wellbutrin XL 450 every morning, clonidine 0.2 mg twice daily, lorazepam 1 mg every 8 hours as needed.   Trazodone 50 mg tablets 1-2 nightly as needed insomnia Has started nortriptyline 75 mg nightly for about 3 weeks and DT level 176, reduced to 50 mg HS early November. pramipexole increased to 0.75 mg twice daily off label for treatment resistant depression  She has been feeling somewhat better with the increase in pramipexole and is tolerating it well.  She still is easily overwhelmed.  Her affect and mood can improve now when around her family or doing something positive.  She has been able to be more productive. She is tolerating the current medications. She is still considering TMS as an alternative to Spravato.   ECT-MADRS    Flowsheet Row Clinical Support from 08/06/2022 in [place] from 07/04/2022 in [place] from 05/21/2022 in [place] Visit from 03/02/2022 in [facility]  MADRS Total Score _0 46        Past Psychiatric Medication Trials: fluoxetine, duloxetine, Viibryd, lamotrigine, Pristiq, sertraline, citalopram,  Trintellix anxious and SI Wellbutrin XL 450 [place] 1 dose  Adderall, Adderall XR, Vyvanse, Ritalin, Strattera low dose NR Lorazepam Trazodone  Depakote,  lamotrigine cog complaints Lithium remotely Abilify 7.5  Vraylar 1.5 mg daily agitation and insomnia Rexulti insomnia Latuda 40 one dose, CO anxious and SI Seroquel XR 300 Olanzapine 10  At visit November 12, 2019. We discussed Patient developed an increasingly severe alcohol dependence problem since her last visit in January.  She went to [facility] and has had no alcohol since then except 1 day.  She never abused stimulants but they took her off the stimulants at [facility].  Her ADD was markedly worse.  The Wellbutrin did not help the ADD.   D history lamotrigine rash at 66 yo  Review of Systems:  Review of Systems  Constitutional:  Positive for fatigue.  Cardiovascular:   Negative for chest pain and palpitations.  Musculoskeletal:  Positive for arthralgias and back pain. Negative for joint swelling.       SP hip surgery October 2020  Neurological:  Negative for dizziness and tremors.  Psychiatric/Behavioral:  Positive for decreased concentration and dysphoric mood. Negative for agitation, behavioral problems, confusion, hallucinations, self-injury, sleep disturbance and suicidal ideas. The patient is nervous/anxious. The patient is not hyperactive.     Medications: I have reviewed the patient's current medications.  Current Outpatient Medications  Medication Sig Dispense Refill   amLODipine (NORVASC) 2.5 MG tablet Take 2.5 mg by mouth daily.     amphetamine-dextroamphetamine (ADDERALL XR) 20 MG 24 hr capsule Take 1 capsule (20 mg total) by mouth every morning. 30 capsule 0   amphetamine-dextroamphetamine (ADDERALL XR) 20 MG 24 hr capsule Take 1 capsule (20 mg total) by mouth every morning. 30 capsule 0   buPROPion (WELLBUTRIN XL) 150 MG 24 hr tablet TAKE 3 TABLETS BY MOUTH DAILY 270 tablet 1   cloNIDine (CATAPRES) 0.2 MG tablet Take 1 tablet (0.2 mg total) by mouth 2 (two) times daily. 60 tablet 2   Dextromethorphan HBr  15 MG CAPS Take 1 capsule (15 mg total) by mouth every 12 (twelve) hours. 60 capsule 0   Esketamine HCl, 84 MG Dose, (SPRAVATO, 84 MG DOSE,) 28 MG/DEVICE SOPK USE 3 SPRAYS IN EACH NOSTRIL ONCE A WEEK 3 each 0   iron polysaccharides (NIFEREX) 150 MG capsule TAKE 1 CAPSULE BY MOUTH EVERY DAY 90 capsule 1   LORazepam (ATIVAN) 1 MG tablet Take 1 tablet (1 mg total) by mouth every 8 (eight) hours as needed. for anxiety 90 tablet 0   losartan (COZAAR) 50 MG tablet Take 50 mg by mouth daily.     nebivolol (BYSTOLIC) 2.5 MG tablet Take 2.5 mg by mouth daily.     nortriptyline (PAMELOR) 25 MG capsule Take 2 capsules (50 mg total) by mouth at bedtime. 180 capsule 0   pramipexole (MIRAPEX) 0.5 MG tablet Take 1 tablet (0.5 mg total) by mouth 3 (three)  times daily. 90 tablet 0   traZODone (DESYREL) 50 MG tablet TAKE 1-2 TABLETS BY MOUTH NIGHTLY AS NEEDED FOR SLEEP 180 tablet 1   No current facility-administered medications for this visit.    Medication Side Effects: None  Allergies:  Allergies  Allergen Reactions   Metronidazole Shortness Of Breath and Other (See Comments)    Heart pounding   Ferrlecit [Na Ferric Gluc Cplx In Sucrose] Other (See Comments)    Infusion reaction 05/12/2019    Past Medical History:  Diagnosis Date   ADHD    Anemia    Anxiety    Arthritis    Depression    Heart murmur    i went to see a cardiologit slast eyar  and i had zero plaque,    PONV (postoperative nausea and vomiting)    Recovering alcoholic in remission ([place])     Family History  Problem Relation Age of Onset   Atrial fibrillation Mother    CAD Father     Past Medical History, Surgical history, Social history, and Family history were reviewed and updated as appropriate.   Please see review of systems for further details on the patient's review from today.   Objective:   Physical Exam:  There were no vitals taken for this visit.  Physical Exam Constitutional:      General: She is not in acute distress. Neurological:     Mental Status: She is alert and oriented to person, place, and time.     Coordination: Coordination normal.     Gait: Gait normal.  Psychiatric:        Attention and Perception: Attention and perception normal.        Mood and Affect: Mood is anxious and depressed. Affect is not labile, blunt, angry or tearful.        Speech: Speech is not rapid and pressured, slurred or tangential.        Behavior: Behavior is not slowed.        Thought Content: Thought content is not paranoid or delusional. Thought content does not include homicidal or suicidal ideation. Thought content does not include suicidal plan.        Cognition and Memory: Cognition normal. Memory is not impaired. She does not exhibit impaired  recent memory.        Judgment: Judgment normal.     Comments: Insight intact. No auditory or visual hallucinations. No delusions.  Depression improving recently Affect better with more spontaneity  now vs before Spravato.  less blunted No Sui intent plan      Lab Review:  Component Value Date/Time   NA 137 01/12/2021 1430   NA 140 11/18/2018 1544   K 3.8 01/12/2021 1430   CL 108 01/12/2021 1430   CO2 22 01/12/2021 1430   GLUCOSE 94 01/12/2021 1430   BUN 14 01/12/2021 1430   BUN 20 11/18/2018 1544   CREATININE 0.82 01/12/2021 1430   CALCIUM 8.9 01/12/2021 1430   PROT 6.6 01/12/2021 1430   ALBUMIN 3.9 01/12/2021 1430   AST 12 (L) 01/12/2021 1430   ALT 11 01/12/2021 1430   ALKPHOS 46 01/12/2021 1430   BILITOT 0.5 01/12/2021 1430   GFRNONAA >60 01/12/2021 1430   GFRAA >60 09/02/2019 0249   GFRAA >60 01/27/2019 0811       Component Value Date/Time   WBC 4.5 01/12/2021 1430   RBC 4.32 01/12/2021 1430   HGB 12.8 01/12/2021 1430   HGB 12.9 07/17/2019 0953   HCT 38.5 01/12/2021 1430   HCT 21.9 (L) 12/25/2018 1221   PLT 272 01/12/2021 1430   PLT 286 07/17/2019 0953   MCV 89.1 01/12/2021 1430   MCH 29.6 01/12/2021 1430   MCHC 33.2 01/12/2021 1430   RDW 12.4 01/12/2021 1430   LYMPHSABS 1.4 01/12/2021 1430   MONOABS 0.4 01/12/2021 1430   EOSABS 0.0 01/12/2021 1430   BASOSABS 0.0 01/12/2021 1430    No results found for: "POCLITH", "LITHIUM"   No results found for: "PHENYTOIN", "PHENOBARB", "VALPROATE", "CBMZ"   .res Assessment: Plan:    Recurrent major depression resistant to treatment ([place])  Generalized anxiety disorder  Attention deficit hyperactivity disorder (ADHD), predominantly inattentive type  Insomnia due to mental condition  Accelerated hypertension   She has treatment resistant major depression at this time.  Have  discussed some of her recent abnormal behaviors leading to this depressive episode getting worse which she says were associated  with heavy use of delta 8 and not a manic episode.  She realizes now that that was not good for her.  She stopped all use of other drugs including those available over-the-counter such as delta 8 or any other THC related products.  She is no longer having any of those types of behaviors and instead is depressed.  She remains depressed with lack of interest and lack of feeling for things that normally she would have feelings about.  She has low motivation and energy.  She is difficulty making decisions.  She is sad and down.  She is less productive than usual.  Her concentration is poor.  She has high degree of anxiety as well.  Her personality is more flat than normal for her.. She has a very negative self-image and low self-esteem.  These are all uncharacteristic for her However all of these symptoms are partially improved through Spravato and the switch from Seroquel to olanzapine.  She is approximately 50% better.  Patient was administered Spravato 84 mg intranasally today.  The patient experienced the typical dissociation which gradually resolved over the 2-hour period of observation.  There were no complications.  Specifically the patient did not have nausea or vomiting or headache.  Blood pressures monitored at the 40-minute and 2-hour follow-up intervals.  Borderline high.  By the time the 2-hour observation period was met the patient was alert and oriented and able to exit without assistance.  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  See nursing note for further details.She wants to continue Spravato. We discussed discussed the side effects in  detail as well as the protocol required to receive Spravato.   Failed multiple antidepressants.  Many of them were not actual failures but intolerances and it is unclear whether some of that was more connected with anxiety than true side effects.  1 example is the  [place].  In general she does not want to try anything but an antidepressant but has failed all major categories of antidepressants except TCAs and MAO inhibitors which have not been tried until now. There is a consistent pattern of thinking she is worse with medication which is not consistent with objective evidence.    Her self assessment is clouded by her depression.  the next logical antidepressant would be to use a tricyclic antidepressant as she has not taken 1 of these medications before. Started nortriptyline 75 mg nightly.  Serum level 176.  Reduced to 50 mg HS  Discussed side effects in detail.  Needs more time to help. Extensive discussion previously about her ambivalence about meds and missatributing sx of depression as SE of meds.    continue Wellbutrin XL 450 mg AM  Off label augmentation DM at a lower dose of 15 mg BID-defer  Recently increase pramipexole augmentation off label to 0.5 mg tID or 0.75 mg BID  Disc SE.  Looks like partial response with this.  Started Spravato 84 mg twice weekly on 03/16/2022.  Now on weekly administration  Adderall  XR 20 mg AM   Ativan 1 mg 3 times daily as needed anxiety but try to cut it back. Is not ideal to use benzodiazepine with stimulant but because of the severity of her symptoms it has been necessary.  Hope to eventually eliminate the benzodiazepine.  Expected as her depression improves her anxiety will improve as well.  However lately her anxiety has been unmanageable.  We will expect that to improve as the depression improves.  She has headed insturctions to reduce this.  Continue clonidine 0.2 mg BID off label for anxiety and helps BP partially. BP is better controlled but not consistent.  Consider increasing amlodipine.  Also on losartan 50  Discussed potential benefits, risks, and side effects of stimulants with patient to include increased heart rate, palpitations, insomnia, increased anxiety, increased irritability, or decreased  appetite.  Instructed patient to contact office if experiencing any significant tolerability issues. She wants to return to usual dose of Adderall for ADD bc of mor poor cognitive function with reduction.  Also discussed that depression will impair cognitive function.  [place] consultation was initially denied .  She says she called [facility] about this but they have not scheduled an appt .Marland Kitchen   Consider consultation with [facility] if not better by New year.  Extensive discussion about her chronic ambivalence about psychiatric medicines and tendency to blame medicine for the symptoms of depression that she had prior to even starting the medications.  After discussing this at length she was able to acknowledge that that is factual.  She does not appear to be having any significant side effects with the current medications.  However so far not much benefit with nortriptyline despite adequate level.  Has Maintained sobriety  No further med changes until after the holidays and consider.  FU with Spravato weekly   Lynder Parents, MD, DFAPA     Please see After Visit Summary for patient specific instructions.  No future appointments.              No orders of the defined types were placed in this encounter.      -------------------------------

## 2022-10-29 NOTE — Progress Notes (Signed)
NURSES NOTE:   Patient arrived for her 46th Spravato treatment. Pt is being treated for Treatment Resistant Depression, pt will be receiving 84 mg which will continue to be her maintenance dose, she receives weekly treatments.  Pt is also planning on [place] maybe the first of the year. Patient arrived and taken to treatment room. Confirmed she had a ride home which is her husband would be coming back to pick up pt when done and sometimes she needs to use [facility] if he is unable to pick her up. Pt's Spravato is ordered through [employer] and delivered to office, all Spravato medication is stored at doctors office per REMS/FDA guidelines. The medication is required to be locked behind two doors per FDA/REMS Protocol. Medication is also disposed of properly per regulations.      Pt is brighter today, feeling better. Lots going on with Christmas activities and that is keeping her busy. Began taking patient's vital signs at 9:45 AM 137/80, pulse 54, Pulse Ox 98%, Gave patient first dose 28 mg nasal spray, each nasal spray administered in each nostril as directed and waited 5 minutes between the second and third dose. All 3 doses given pt did not complain of any nausea/vomiting, given a cup of water due to the taste after the administration of Spravato.  She listens to [employer] with spa or relaxing music.  Checked 40 minute vitals at 10:30 AM, 129/89, pulse 71, Pulse Ox 92%. Explained she would be monitored for a total time of 120 minutes. Discharge vitals were taken at 11:40 AM 104/85 P 69, 99% Pulse Ox. Dr. Clovis Pu met with pt today and discussed her medication. I walked pt to elevator, where her husband met her for the ride home. Recommend she go home and sleep or just relax on the couch. No driving, no intense activities. Verbalized understanding. Nurse was with pt a total of 70 minutes for clinical. Pt is coming back in January 2024. Pt instructed to call office with any problems or questions.      LOT  [id number] EXP OCT 2026

## 2022-10-31 ENCOUNTER — Other Ambulatory Visit: Payer: Self-pay | Admitting: Psychiatry

## 2022-10-31 NOTE — Telephone Encounter (Signed)
Please send updated Rx.

## 2022-11-02 ENCOUNTER — Other Ambulatory Visit: Payer: Self-pay | Admitting: Psychiatry

## 2022-11-02 DIAGNOSIS — F5105 Insomnia due to other mental disorder: Secondary | ICD-10-CM

## 2022-11-06 ENCOUNTER — Other Ambulatory Visit: Payer: Self-pay | Admitting: Psychiatry

## 2022-11-09 ENCOUNTER — Other Ambulatory Visit: Payer: Self-pay

## 2022-11-09 ENCOUNTER — Telehealth: Payer: Self-pay | Admitting: Psychiatry

## 2022-11-09 DIAGNOSIS — F339 Major depressive disorder, recurrent, unspecified: Secondary | ICD-10-CM

## 2022-11-09 MED ORDER — PRAMIPEXOLE DIHYDROCHLORIDE 0.5 MG PO TABS: 0.5000 mg | ORAL_TABLET | Freq: Three times a day (TID) | ORAL | 0 refills | Status: DC

## 2022-11-09 NOTE — Telephone Encounter (Signed)
Spoke to pt

## 2022-11-09 NOTE — Telephone Encounter (Signed)
Laura Chang called requesting a refill on her Pramipesole 5 mg tablets. She said the pharmacy needs a new RX as she is out. She states Dr. Jennelle Human verbally increased it from 1 to 1-1/2 pills. Pharmacy is:  [id number] - [place], [place] - [address] AT [facility] [address]   Phone: [phone]  Fax: [phone]

## 2022-11-11 ENCOUNTER — Encounter: Payer: Self-pay | Admitting: Psychiatry

## 2022-11-15 ENCOUNTER — Ambulatory Visit: Payer: 59

## 2022-11-15 ENCOUNTER — Ambulatory Visit (INDEPENDENT_AMBULATORY_CARE_PROVIDER_SITE_OTHER): Payer: 59 | Admitting: Psychiatry

## 2022-11-15 VITALS — BP 107/76 | HR 68

## 2022-11-15 DIAGNOSIS — F900 Attention-deficit hyperactivity disorder, predominantly inattentive type: Secondary | ICD-10-CM | POA: Diagnosis not present

## 2022-11-15 DIAGNOSIS — F411 Generalized anxiety disorder: Secondary | ICD-10-CM

## 2022-11-15 DIAGNOSIS — F339 Major depressive disorder, recurrent, unspecified: Secondary | ICD-10-CM

## 2022-11-15 DIAGNOSIS — I10 Essential (primary) hypertension: Secondary | ICD-10-CM

## 2022-11-15 DIAGNOSIS — F5105 Insomnia due to other mental disorder: Secondary | ICD-10-CM

## 2022-11-16 ENCOUNTER — Other Ambulatory Visit: Payer: Self-pay | Admitting: Psychiatry

## 2022-11-18 ENCOUNTER — Encounter: Payer: Self-pay | Admitting: Psychiatry

## 2022-11-18 NOTE — Progress Notes (Signed)
Laura Chang [id number] September 16, 1956 67 y.o.  Subjective:   Patient ID:  Laura Chang is a 67 y.o. (DOB 05-09-1956) female.  Chief Complaint:  Chief Complaint  Patient presents with   Follow-up   Depression   Anxiety   Fatigue   ADD     HPI Laura Chang Laura Chang presents to the office today for follow-up of depression and anxiety and ADD.  seen November 12, 2019.  Melted down in 2020.  Went to [employer] in July.  No withdrawal.  1 drink since then.  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 She was on no stimulant and no SSRI but was taking Strattera and Wellbutrin.  The following changes were made. Stop Strattera. OK restart stimulant bc severe ADD Restart Adderall 1 daily for a few days and if tolerated then restart 1 twice daily. If not tolerated reduce the dosage if needed. May need to stop Wellbutrin if not tolerating the stimulant.  Yes.  DC Wellbutrin Restart Prozac 20 mg daily.  February 2021 appointment with the following noted: Completed grant proposal.  Couldn't doit without Adderall.  Sold a bunch of work.   Adderall XR lasts about 3 pm.  Strength seems about right.  BP been OK.  Not jittery.   Stopped Wellbutrin but had no SE. Mood drastically better with grant proposal and back on fluoxetine.  Less depressed and lethargic.  No anxiety.  Cut back on coffee. Started back with devotions and stronger faith. Plan: Continue Prozac 20 mg daily. May have to increase the dose at some point in the future given that she usually was taking higher dosages but she is getting good response at this time. Restart Wellbutrin off label for ADD since can't get 2 ADDERALL daily. 150 mg daily then 300 mg daily. She can adjust the dose between 150 mg and 300 mg daily to get the optimal effect.   05/11/2020 appointment with the following noted: Has been inconsistent with Prozac and Wellbutrin. Not sure of the effect of Wellbutrin. Biggest deterrent in work is  anxiety.  Some of the work is conceptual and difficult at times.  Can feel she's not up to a project at times.  Overall is OK but would like a steadier benefit from stimulant.  Exhausted from managing concentration and keeping up with things from the day.  Loses things.  Not good keeping up with schedule. Overall productive and emotionally OK. Can feel Adderall wear off. Mood is better in summer and worse in the winter.   F died in 2023-10-21 and that is a loss. No SE Wellbutrin. Still attends AA meetings.  Real benefit from [place] last year. Recognizes effect of anemia on ADD and mood.  Had iron infusions last winter. Plan:  Wellbutrin off label for ADD since can't get 2 ADDERALL daily. 150 mg daily then 300 mg daily.  01/24/2021 appointment with following noted: Doing a program called [employer] mindfulness app since Xmas.  CBT app helped the depression.  App helped her focus better.  Lost sign weight. Writing a lot. Before Xmas felt depressed and started negative thinking worse, self denigrating. Not drinking. More isolated.   Recognizes mo is narcissist.    Didn't tell anyone she was born until 3 mos later.  M aloof and uninterested in pt.  Lied about her birthday.  Mo lack of affection even with pt's kids. Going to [place] for a year and it helped her to quit drinking. Also misses  kids being gone with a hole also. Plan: No med changes  05/04/2021 appointment with the following noted: Therapist Paula Pile thinks she's manic. Lost weight to 144#.   States she is still sleeping okay.  Admits she is hyper and recognizes that she is likely manic.  She feels great, euphoric with an increased sense of spiritual connectedness to God.  She has racing thoughts and talks fast and talks a lot and this is noted by her husband.  He thinks she is a bit hyper.  She has been able to maintain sobriety although she will have 1 glass of wine on special occasions but does not drink by herself.  She is not  drinking to excess.  She denies any dangerous impulsivity.  She is clearly not depressed and not particularly anxious.  She has no concerns about her medication and she has been compliant.  06/16/21 appt noted: So much better.  Going through a lot but the manic thing happened on top of it.  So much slower.  Didn't feel like losing anything with risperidone.  Likes the Adderalll at 10 mg. Some drowsiness in the AM and very drowsy from risperidone 2 mg HS. Prayer life is better. Handling stress better. Less depressed with risperidone. Still likes trazodone. Sleeps well. Plan: Reduce Prozac to 10 mg daily.  Consider stopping it because it can feel the mania however she is reluctant to do that because she fears relapse of depression. Reduce risperidone to 1.5 mg nightly due to side effects.  Discussed risk of worsening mania.  07/25/2021 appointment with the following noted: Misses the Adderall and hard to function without it. Depressed now. Heavy chest.  Anxious and guilty.  Body feels heavy.   Hates Wellbutrin.   Plan: Increase fluoxetine to 20 mg daily Add Abilify 1/2 of 15 mg tablet daily Wean wellbutrin by 1 tablet each week  bc she feels it is not helpful and DT polypharmacy Reduce risperidone to 1 daily for 1 week and stop it. Disc risk of mania. Increase Adderall to XR 20 mg AM  08/08/21 Much less depressed and starting to feel normal I feel a lot better. No SE.  Speech normal off risperidone. Sleeping OK on trazaodone and enough.   Noticed benefit from Adderall again. Plan: continue fluoxetine to 20 mg daily Continue Abilify 1/2 of 15 mg tablet daily for depression and mania continue Adderall to XR 20 mg AM  10/10/2021 phone call: Pt stated she feels like the Abilify should be decreased to 5mg.She said she is depressed but rational and not suicidal.She has an appt Monday and can wait until then if you prefer. MD response: Reduce the Abilify to 7.5 mg every other day.  We will meet on  10/16/2021 and decide what to do from there.  10/16/2021 appointment with the following noted: More depressed.  Most depressed I've ever been.  Just numb.  Sense of grief.   Thinks the manic episode was unlike anything else she ever had.  Doesn't want to medicate against it.  Don't enjoy people.  Easily overwhelmed.  Had some death thoughts but not suicidal.  Has been functional.  Feels better today after reducing Abilify to every other day but she is only been doing that for 3 days. A/P: Episode of post manic depression was explained. continue fluoxetine to 20 mg daily Hold Abilify for 1 week then resume Abilify 1/2 of 15 mg tablet every other day for depression and mania continue Adderall to XR 20 mg   AM  10/27/2021 appointment with the following noted: I'm doing so much better.  Handling the depression better. Better self talk and spiritual focus has helped.   Dep 6/10 manifesting as anxiety with low confidence.   F died 2  years ago and M 67 yo and is dependent . She is working hard to feel better but still feels depressed.  She almost feels like she has a little more anxiety since restarting Abilify every other day. Plan: continue fluoxetine to 20 mg daily DC Abilify .  Vrayalar 1.5 mg QOD to try to get rid of depression ASAP. continue Adderall to XR 20 mg AM  11/10/2021 appointment with the following noted: Busy with Xmas and it was fun with family but then a big let down.  Did well with it.  Functioned well with it.  Working hard on things with depression.  Not shutting down. Not sure but feels better today but yesterday was hard.  Difficulty dealing with mother.  She won't do anything to help herself.  Yesterday with her all day.  Won't do PT and has isolated herself.    Lack of confidence.   No SE with Vraylar.  11/24/21 urgent appointment appt noted: More and more depressed.   So anxious and doesn't want to be alone but can do so. No appetite. Hurts inside. Has had some fleeting  suicidal thoughts but would not act on them.  Tolerating meds. Has been consistent with Vraylar 1.5 mg every other day, fluoxetine 20 mg daily Plan: Increase Vraylar to 1.5 mg daily Change Prozac to Trintellix 10 mg daily. Discussed side effects of each continue Adderall to XR 20 mg AM  12/27/2021 appointment with the following noted: Not OK.  I feel less depressed but feels bat shit. Not sleeping well.  Extremely anxious. Off and on sleep. 3-4 hours of sleep.   Still having daily SI.  But also become obvious has so much to do.  Overwhelmed by tasks.   Needs anxiety meds to just function. Not more motivated.  Walked yesterday.   Feels afraid like in trouble but not irritable or angry. DC DT agitation Vraylar to 1.5 mg daily Change Prozac to Trintellix 10 mg daily. Hold Adderall to XR 20 mg AM Clonidine 0.1 1/2 tablet twice daily for 2 days and if needed for anxiety and sleep increase to 1 twice daily Ok temporary Ativan 1 mg 3 times daily as needed anxiety  01/05/22 appt noted: Off fluoxetine and  Trintellix.  Only on Ativan, trazodone and Adderall XR 20 plus added clonidine 0.1 mg BID Didn't think she needed to start Trintellix. Not taking Ativan.   Didn't like herself last week. Feels some better today. Wonders if the manic sx Not agitated.  Anxiety kind of calmed down.  A lot to be anxious about situationally.  $ stress. Concerns about downers with meds. Can't access normal personality. ? Lethargy and inability to talk as sE. Plan: Latuda 20-40 mg daily with food. Adderall to XR 20 mg AM Clonidine 0.1 1/2 tablet twice daily  reduce dose to be sure no SE Ok temporary Ativan 1 mg 3 times daily as needed anxiety  01/19/22 appt noted: Taking Latuda 20 mg daily.  Took 40 mg once and felt anxious and  SI Still depressed and not very reactive Anxiety mainly about the depression and fears of the future. She wants to revisit manic sx and thinks it was maybe bc taking delta 8 bc was  taking a lot of   it so still doesn't think she's classic bipolar. She wants to only take Prozac bc thinks Latuda is perpetuating depression. Says the delta 8 was very psychaedelic.  When not taking it was not manic.  Sleeping ok again.  Plan: Per her request DC Latuda 20-40 mg daily with food. She wants to continue Prozac alone AMA  Adderall to XR 20 mg AM Clonidine 0.1 1/2 tablet twice daily  reduce dose to be sure no SE Ok temporary Ativan 1 mg 3 times daily as needed anxiety  01/23/2022 phone call complaining of increased anxiety since stopping Latuda.  She will try increasing clonidine.  01/26/2022 phone call not feeling well and wanted to restart the Vraylar.  However notes indicate that had made her agitated therefore she was encouraged to pick up samples of Rexulti 1 mg and start that instead.  02/06/2022 phone call: Stating she felt the Rexulti was helping with depression but she was not sleeping well and obsessing over things.  She was encouraged to increase Rexulti to 2 mg daily and increase trazodone for sleep.  02/09/2022 appointment with the following noted: This was an urgent work in appointment No sleep last night with trazodone 100 mg HS Nothing really better depression or anxiety. Ruminating negative anxious thoughts. Did not tolerate Rexulti because it was causing insomnia.  Does not think it helped depression.  Lacks emotion that she should have.  Lacks her usual personality.  Some hopeless thoughts.  Some death thoughts.  Some suicidal thoughts without plan or intent Plan: DC Rexulti and Prozac & DC trazodone Adderall to XR 20 mg AM Clonidine 0.1 1/tablet twice daily  reduce dose to be sure no SE Ok temporary Ativan 1 mg 3 times daily as needed anxiety Start Seroquel XR 150 mg nightly  03/02/2022 appointment: Laura Chang called back a few days after starting Seroquel stating it was making her more anxious and more depressed.  This seemed unlikely as this medicine rarely ever  causes anxiety.  She stopped the medication waited 3 days and called back still had anxiety and depression but thought perhaps the anxiety was a little better.  She did not want to take the Seroquel. She knew about the option of Spravato and wanted to pursue that. Now questions whether to return to Seroquel while waiting to start Spravato bc feels just as bad without it and knows she didn't give it enough time to work.   MADRS 46  ECT-MADRS    Flowsheet Row Clinical Support from 08/06/2022 in [facility] Clinical Support from 07/04/2022 in [facility] Clinical Support from 05/21/2022 in [facility] Office Visit from 03/02/2022 in [facility]  MADRS Total Score 29 15 27 46      03/14/22 appt noted: Pt received Spravato 56 mg first dose today with some dissociative sx which were not severe.  She was anxious prior to the administration and felt better after receiving lorazepam 1 mg.  No NV, or HA. Wants to continue Spravato. Ongoing depression and desperate to feel better.  I'm not myself DT deprsssion which is most severe in recent history.  Anhedonia.  Low motivation.  Social avoidance. Continues to think all recent med trials are making her worse.  Sleep ok with Seroquel.  03/16/22 appt noted: Received Spravato 84 mg for the first time.  some dissociative sx which were not severe.  She was anxious prior to the administration and felt better after receiving lorazepam 1 mg.  No NV, or HA. Wants   to continue Spravato.   Does not feel any better or different since the last appt.  Ongoing depression.  Ongoing depression and desperate to feel better.  I'm not myself DT deprsssion which is most severe in recent history.  Anhedonia.  Low motivation.  Social avoidance. Continues to think all recent med trials are making her worse.  Sleep ok with Seroquel.  Does not want to continue Seroquel for TRD.  03/20/2022 appointment noted: Came for  Spravato administration today.  However blood pressure was significantly elevated approximately 180/115.  She was given lorazepam 1 mg and clonidine 0.2 mg to try to get it down. She states she regretted stopping the Seroquel XR 300 mg tablets.  She now realizes it was helpful.  She did not sleep much at all last night.  She did not take the Adderall this morning. 2 to 3 hours after arrival blood pressure was still elevated at  170/110, 62 pulse.  For Spravato administration was canceled for today.  She admits to being anxious and depressed.  She is not suicidal.  She is highly motivated to receive the Spravato.  We discussed getting it tomorrow.  03/22/2022 appointment noted: Patient's blood pressure was never stable enough yesterday in order to get her in for Spravato administration.  She was encouraged to see her primary care doctor.  It is better today.  03/26/2022 appointment with the following noted: Blood pressure was better.  Saw her primary care doctor who started on oral Bystolic 2.5 mg daily. Received Spravato 84 mg today as scheduled.  Tolerated it well without nausea or vomiting headache or chest pain or palpitations.  Her blood pressure was borderline but manageable. She remains depressed and anxious.  She is ambivalent about the medicine and desperate to get to feel better.  Continues to have anhedonia and low energy and low motivation and reduced ability to do things.  Less social.  Not suicidal.  03/28/22 appt noted: Received Spravato 84 mg today as scheduled.  Tolerated it well without nausea or vomiting headache or chest pain or palpitations.  Her blood pressure was borderline but manageable. Has not seen any improvement so far.  Tolerating Seroquel.  Inconsistent with Bystolic and BP has been borderline high. Still depressed and anxious and anhedonia.  Low motivation, energy, productivity. Taking quetiapine and tolerating XR 300 mg nightly.  04/04/22 appt noted: Received Spravato  84 mg today as scheduled.  Tolerated it well without nausea or vomiting headache or chest pain or palpitations.  Her blood pressure was borderline but manageable. Has not seen any improvement so far.  Tolerating Seroquel.   She still tends to think that the medications are making her worse.  She has said this about each of the recent psychiatric medicines including Seroquel.  However her husband thinks she is improved.  She also admits there is some improvement in productivity.  She still feels highly anxious.  She still does not enjoy things as normal.  She still feels desperate to improve as soon as possible. Has been taking Seroquel XR since 03/20/2022  04/10/22 appt noted: Received Spravato 84 mg today as scheduled.  Tolerated it well without nausea or vomiting headache or chest pain or palpitations.  Her blood pressure was borderline but manageable. Has not seen any improvement so far.  Tolerating Seroquel.  Doesn't like Seroquel bc she thinks it flattens here. Ongoing depression without confidence Plan: Start Auvelity 1 every morning for persistent treatment resistant depression  04/12/2022 appointment with the following noted:  Received Spravato 84 mg today as scheduled.  Tolerated it well without nausea or vomiting headache or chest pain or palpitations.  Her blood pressure was borderline but manageable. Has not seen any improvement so far.  Tolerating Seroquel.  Doesn't like Seroquel bc she thinks it flattens her. Received Spravato 84 mg today as scheduled.  Tolerated it well without nausea or vomiting headache or chest pain or palpitations.  Her blood pressure was borderline but manageable. Has not seen any improvement so far.  Tolerating Seroquel.  Doesn't like Seroquel bc she thinks it flattens here.  We discussed her ambivalence about it. She is starting Auvelity and has tolerated it the last 2 days without side effect.  She still does not feel like herself and feels flat and not enjoying  things with suppressed expressed emotion  04/17/2022 appointment with the following noted: Received Spravato 84 mg today as scheduled.  Tolerated it well without nausea or vomiting headache or chest pain or palpitations.  Her blood pressure was borderline but manageable. Has not seen any improvement so far.  Tolerating Seroquel.  Doesn't like Seroquel bc she thinks it flattens her. She has been tolerating the Auvelity 1 in the morning without side effects for about a week.  She has not noticed significant improvement so far.  She still feels depressed and flat and not herself.  Other people notice that she is flat emotionally.  She is not suicidal.  She does feel discouraged that she is not getting better yet.  04/19/2022 appointment noted: Has increased Auvelity to 1 twice daily for 2 days, continues quetiapine XR 300 mg nightly, clonidine 0.3 mg twice daily, lorazepam 1 mg twice daily for anxiety and Adderall XR 20 mg in the morning. No obious SE but she still thinks quetiapine XR is making her feel down.  But not sedated Received Spravato 84 mg today as scheduled.  Tolerated it well without nausea or vomiting headache or chest pain or palpitations.  Her blood pressure was borderline but manageable. She still feels quite anxious and feels it necessary to take both the clonidine and lorazepam twice a day to manage her anxiety.  She has been consistently down and flat and not herself until yesterday afternoon she noted an improvement in mood and feeling much more like herself with her normal personality reemerging.  She was quite depressed in the morning with very dark negative thoughts.  She did not have those dark negative thoughts this morning.  She had a lot of questions about medication and when she was expecting to be improved and why she has not shown improvement up to now.  04/23/22 appt noted: Has increased Auvelity to 1 twice daily for 1 week, continues quetiapine XR 300 mg nightly, clonidine 0.3  mg twice daily, lorazepam 1 mg twice daily for anxiety and Adderall XR 20 mg in the morning. No obious SE but she still thinks quetiapine XR is making her feel down.  But not sedated Received Spravato 84 mg today as scheduled.  Tolerated it well without nausea or vomiting headache or chest pain or palpitations.  She is still depressed but admits better function and is able to enjoy social interactions. Tolerating meds.  Would like to feel better for sure. Not herself.  Flat. Plan increase Auvelity to 1 tab BID as planned and reduce Quetiapine to 1/2 of ER 300 mg  bc NR for depression.  04/25/2022 appointment with the following noted: clonidine 0.3 mg twice daily, lorazepam 1 mg twice daily   for anxiety and Adderall XR 20 mg in the morning. Seroquel XR 300 HS No obious SE but she still thinks quetiapine XR is making her feel down.  But not sedated Received Spravato 84 mg today as scheduled.  Tolerated it well without nausea or vomiting headache or chest pain or palpitations.  Called yesterday with more anxiety.  Had increased Auvelity for 1 day and reduced Seroquel XR for 1 day.  Felt restless and fearful  05/01/2022 appointment noted: clonidine 0.3 mg twice daily, lorazepam 1 mg twice daily for anxiety and Adderall XR 20 mg in the morning. Seroquel XR 150 HS, Auvelity 1 BID Received Spravato 84 mg today as scheduled.  Tolerated it well without nausea or vomiting headache or chest pain or palpitations.  Nurse has noted patient has called multiple times sometimes asking the same question repeatedly.  It is unclear whether she is truly forgetful or is just anxious seeking reassurance. Patient acknowledges ongoing depression as well as some anxiety but states she has felt a little better in the last couple of days.  She has reduced the Seroquel to 150 mg at night and has increased Auvelity to 1 twice daily but only for 1 day.  So far she seems to be tolerating it.  05/03/22 appt noted: clonidine 0.2 mg  twice daily, lorazepam 1 mg twice daily for anxiety and Adderall XR 20 mg in the morning. Seroquel XR 150 HS, Auvelity 1 BID BP high this am about 170/100 and received extra clonidine 0.2 mg and came to receive Spravato.  Not dizzy, no SOB, nor CP but BP is still high Could not receive Spravato today bc BP high and pulse low at 30 ppm. Still depressed and anxious. Plan: continue trial Auvelity with Spravato She needs to get BP and pulse managed  05/08/22 TC: RTC  H Michael NA and mailbox full.  Could not leave message.  Pt  -  talked to she and H on speaker. H worried over wife.  Vacant stare.  Slurs words at times.  Not smiling. Reduced enjoyment.  Depression.  Withdrawn from usual activities.  Some irritability.  Anxious. Disc her concerns meds are making her worse.  Extensive discussion about her treatment resistant status.  There is a consistent pattern of not taking the medicines long enough to get benefit because she believes the meds are making her worse.  However the symptoms she describes as side effects are exactly the same symptoms that she had prior to taking the medication RX for  the depression.  So it is not clear that these are actual side effects. This is true about the 2 most recent meds including Seroquel and Auvelity.  Recommend psychiatric consultation in hopes of improving her comfort level with taking prescribed medications for a sufficient length of time to provide benefit. Extensive discussion about ECT is the treatment of choice for treatment resistant depression.  Spravato may work if she can comply with consistency.  There are medication options but they take longer to work.   Plan:  Reduce clonidine to 0.1 mg BID DT bradycardia.  Talk with PCP about BP and low pulse problems which are interfering with her consistent compliance with Spravato.   Limit lorazepam to 3 -4  mg daily max. Excess use is the cause of slurring speech.  She must stop excess use or will have to stop  the med. Stop Auvelity per her request.  But she has only been on the full dose for a little over  a week and clearly has not had time to get benefit from it.  She thinks maybe it is making her more anxious. Reduce Seroquel from 150XR to 50 -100 mg at night IR.  She couldn't sleep when stopped it completely. Will not start new antidepressant until her SE issues are resolved or not. Get second psych opinion from Chris Aiken MD or another psychiatrist.  H's sister is therapist in [place] Dmetrius Ambs Cottle, MD, DFAPA  05/16/2022 appointment with the following noted: Received Spravato 84 mg today as scheduled.  Tolerated it well without nausea or vomiting headache or chest pain or palpitations.  She stopped Auvelity as discussed last week. On her own, without physician input, she restarted Wellbutrin XL 450 mg every morning today.  She had taken it in the past.  She feels jittery and anxious. She feels less depressed than she did last week.  But she is still depressed without her usual range of affect.  She still is less social and less motivated than normal. Her primary care doctor increased the dose of losartan Plan: Stop Seroquel Reduce Wellbutrin XL to 300 mg every morning.  Starting the dose at 450 every morning is likely causing side effects of jitteriness and it should not be started at that have a dosage. Recommend she not change meds on her own without MDM put  05/23/2022 appointment with the following noted: Received Spravato 84 mg today as scheduled.  Tolerated it well without nausea or vomiting headache or chest pain or palpitations.  Has not dropped seroquel XR 300 mg 1/2 tablet nightly bc couldn't sleep without it. Has not tried lower dose quetiapine 50 mg HS Still feels depressed.   BP is better managed so far, just saw PCP.  BP is better today and infact is low today. Dropped clonidine as directed from 0.3 mg BID bc inadequate control of BP to 0.2 mg BID.  However she wants to increase it  back to 0.3 mg twice daily because she feels it helped her anxiety better.  Wonders about increasing Wellbutrin for depression.  However she has only been on 300 mg a day for a week.  She was on 450 mg daily in the past.  06/06/22 appt noted: Received Spravato 84 mg today as scheduled.  Tolerated it well without nausea or vomiting headache or chest pain or palpitations.  She is still depressed and anxious.  She wants to try to stop the Seroquel but cannot sleep without some of it.  She is taking lorazepam 1 mg 4 times daily and still having a lot of anxiety.  She wants to increase clonidine back to 0.3 mg twice daily.  She hopes for more improvement She recently went for a second psychiatric opinion as suggested the results of that are pending.  06/11/22 appt noted: Received Spravato 84 mg today as scheduled.  Tolerated it well without nausea or vomiting headache or chest pain or palpitations.  She is still depressed and anxious. Without much change.  Still hopeless, anhedonia, reduced inteterest and motivation.  Tolerating meds. Disc concerns Spravato is not hleping much. Plan: stop Seroquel and start olanzapine 10 mg HS for TRD and anxiety.  06/13/2022 appointment noted: Received Spravato 84 mg today as scheduled.  Tolerated it well without nausea or vomiting headache or chest pain or palpitations.  She is still depressed and anxious. Without much change.  Still hopeless, anhedonia, reduced inteterest and motivation.  Tolerating meds. Disc concerns Spravato is not helping much as hoped but is   improving a bit in the last week. Tolerating meds. Continues Wellbutrin XL 450 AM, tolerating recently started olanzapine  10 mg HS. Sleep is good.   Pending appt with TMS consult.  06/18/22 appt noted: Received Spravato 84 mg today as scheduled.  Tolerated it well without nausea or vomiting headache or chest pain or palpitations.  Tolerating meds. Continues Wellbutrin XL 450 AM, tolerating recently started  olanzapine  10 mg HS. Continues Adderall XR 20 amd and has tried to reduce lorazepam to 1mg TID Sleep is good.   Pending appt with TMS consult. Depression is a little bit better in the last week with a little improvement in emotional expression and interest.  She is pushing herself to be more active.  Her daughter thought she was a little better than she has been.  However she is still depressed and still not her normal self with anhedonia and reduced emotional expressiveness.  06/20/22 appt noted: Received Spravato 84 mg today as scheduled.  Tolerated it well without nausea or vomiting headache or chest pain or palpitations.  Tolerating meds with a little sleepiness. Continues Wellbutrin XL 450 AM, tolerating recently started olanzapine  10 mg HS. Continues Adderall XR 20 amd and has tried to reduce lorazepam to 1mg TID Sleep is good.   Mood is improving.  Better funciton.  Anxiety is better with olanzapine. Still not herself and depression not gone with some anhedonia and social avoidance and feeling overwhelmed.  8/14 2023 received Spravato 84 mg 06/27/2022 received Spravato Spravato 84 mg 07/02/2022 received Spravato 84 mg 07/04/2022 received Spravato 84 mg  07/09/2022 appointment noted: Received Spravato 84 mg today as scheduled.  Tolerated it well without nausea or vomiting headache or chest pain or palpitations.  Expected dissociation and feels less depressed with resolution of negative emotions immediately after Spravato and then depression, anxiety creep back in. Continues meds Adderall XR 20 mg every morning, Wellbutrin XL 450 every morning, clonidine 0.1 mg twice daily, lorazepam 1 mg every 6 hours as needed, olanzapine increased from 7.5 to 10 mg nightly on  Tolerating meds.  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  She does note some increased appetite and is  somewhat concerned about that but has not gained significant amounts of weight. She has had the TMS consultation which was initially denied but she knows it can be appealed.  However because she is improving with Spravato plus the other medications now she wants to continue the current treatment plan.  07/18/22 appt noted: Continues meds Adderall XR 20 mg every morning, Wellbutrin XL 450 every morning, clonidine 0.1 mg twice daily, lorazepam 1 mg every 6 hours as needed, olanzapine increased from 7.5 to 10 mg nightly on 07/04/2022. Received Spravato 84 mg today as scheduled.  Tolerated it well without nausea or vomiting headache or chest pain or palpitations.  Expected dissociation and feels less depressed with resolution of negative emotions immediately after Spravato and then depression, anxiety creep back in. Continues meds Adderall XR 20 mg every morning, Wellbutrin XL 450 every morning, clonidine 0.1 mg twice daily, lorazepam 1 mg every 6 hours as needed, olanzapine increased from 7.5 to 10 mg nightly on  Tolerating meds.  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  She does note some increased appetite and   is somewhat concerned about that but has not gained significant amounts of weight. She has had the [place] consultation which was initially denied but she knows it can be appealed. She continues to have chronic ambivalence about psychiatric medicines and initially tends to blame her depressive symptoms such as decreased concentration and feeling flat on what ever medicine she currently is taking even though she had the same symptoms before the current medicines were started.  Then after discussion she does admit that her depressive symptoms are improved since adding olanzapine but still has those residual symptoms noted.  07/23/22 received Spravato 84 mg   07/30/2022 appointment  noted: Received Spravato 84 mg today as scheduled.  Tolerated it well without nausea or vomiting headache or chest pain or palpitations.  Expected dissociation and feels less depressed with resolution of negative emotions immediately after Spravato and then depression, anxiety creep back in. Continues meds Adderall XR 20 mg every morning, Wellbutrin XL 450 every morning, clonidine 0.1 mg twice daily, lorazepam 1 mg every 6 hours as needed, olanzapine increased from 7.5 to 10 mg nightly on  She has been inconsistent with olanzapine because she continues to be ambivalent about the medications in general and thinks that perhaps the 10 mg is making her feel blunted.  She continues to feel some depression.  She had a good day this week and but still feels somewhat depressed and persistently anxious. Plan: be consistent with olanzapine 10 mg HS for TRD and longer trial for potential benefit for anxiety.  Has not taken it consistently.  08/06/2022 appointment noted: Received Spravato 84 mg today as scheduled.  Tolerated it well without nausea or vomiting headache or chest pain or palpitations.  Expected dissociation and feels less depressed with resolution of negative emotions immediately after Spravato and then depression, anxiety creep back in. Continues meds Adderall XR 20 mg every morning, Wellbutrin XL 450 every morning, clonidine 0.1 mg twice daily, lorazepam 1 mg every 6 hours as needed, olanzapine i 10 mg nightly  She continues to feel depressed but is about 50% better with Spravato.  She is still not herself.  She still has anhedonia.  She still is not her able to engage socially in the typical ways.  She is not jovial and outgoing like normal.  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  She continues to feel that her personality is dampened down.  There is a question about whether it is  related to depression or medication. Plan: continue olanzapine 10 for longer trial for TRD and severe anxiety.  08/13/22 appt noted:  Received Spravato 84 mg today as scheduled.  Tolerated it well without nausea or vomiting headache or chest pain or palpitations.  Expected dissociation and feels less depressed with resolution of negative emotions immediately after Spravato and then depression, anxiety creep back in. Continues meds Adderall XR 20 mg every morning, Wellbutrin XL 450 every morning, clonidine 0.1 mg twice daily, lorazepam 1 mg every 6 hours as needed, olanzapine i 10 mg nightly  She still does not feel herself.  Still struggles with depression and low motivation and reduced social engagement and reduced interest and reduced emotional expression.  She is somewhat better with the medicines plus Spravato.  She still believes the Spravato makes her blunted and is not sure how much it helps her anxiety.  She can have good days when  her family is around and she is engaged.  She still wants to stop the olanzapine. She has apparently continued to take the trazodone despite having been told to stop it when she started olanzapine.  She feels like she needs the trazodone. Plan: DC olanzapine and Start nortriptyline 25 mg nightly and build up to 75 mg nightly and then check blood level.    08/27/2022 appointment noted: Received Spravato 84 mg today as scheduled.  Tolerated it well without nausea or vomiting headache or chest pain or palpitations.  Expected dissociation and feels less depressed with resolution of negative emotions immediately after Spravato and then depression, anxiety creep back in. Continues meds Adderall XR 20 mg every morning, Wellbutrin XL 450 every morning, clonidine 0.1 mg twice daily, lorazepam 1 mg every 6 hours as needed. Stopped olanzapine and started nortriptyline which she has taken for about a week is 75 mg nightly. So far she is tolerating the nortriptyline well with the  exception of some dry mouth and constipation which she is working to manage.  She does not feel substantially better better or different off the olanzapine.  No change in her sleep which is good.  Main concern currently in addition to the residual depression is anxiety which is somewhat situational with pending arch show.  She is worrying about it more than normal.  Says she is having to take lorazepam twice a day where she had been able to keep reduce it prior to this.  She still does not feel like herself with residual depression with less social interest and less of her usual buoyancy in personality.  She is flatter than normal.  Overall she still feels that the Spravato has been helpful at reducing the severity of the depression.  She is not suicidal. She has not heard anything about the TMS appeal as of yet.  09/05/2022 appointment noted: Received Spravato 84 mg today as scheduled.  Tolerated it well without nausea or vomiting headache or chest pain or palpitations.  Expected dissociation and feels less depressed with resolution of negative emotions immediately after Spravato and then depression, anxiety creep back in. Continues meds Adderall XR 20 mg every morning, Wellbutrin XL 450 every morning, clonidine 0.1 mg twice daily, lorazepam 1 mg every 6 hours as needed. Stopped olanzapine and started nortriptyline which she has taken for about 2 week is 75 mg nightly. Initially blood pressure was a little high causing delay in starting Spravato.  She admitted to feeling a little wound up.  She still experiences a little increase in depression if she goes longer than a week in between doses of Spravato.  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  09/10/22 appt noted: Received Spravato 84 mg today as scheduled.  Tolerated it well without nausea or vomiting headache or chest pain or palpitations.   Expected dissociation gradually resolved over the 2 hour observation period. She feels 50% less depressed with Spravto and wants to continue it.   Continues meds Adderall XR 20 mg every morning, Wellbutrin XL 450 every morning, clonidine 0.1 mg twice daily, lorazepam 1 mg every 6 hours as needed. Has started nortriptyline 75 mg nightly for about 3 weeks. Has not seen a significant difference with the addition of nortriptyline.  Tolerating it pretty well. She continues to have some degree of anhedonia and significant depression and anxiety.  Her daughters noticed that   she is more needy and calls more frequently.  She acknowledges this as well.  She is clearly still not herself. Plan: pramipexole off label and RX 0.25 mg BID  09/17/2022 appointment noted: Received Spravato 84 mg today as scheduled.  Tolerated it well without nausea or vomiting headache or chest pain or palpitations.  Expected dissociation gradually resolved over the 2 hour observation period. She feels 50% less depressed with Spravto and wants to continue it.   Continues meds Adderall XR 20 mg every morning, Wellbutrin XL 450 every morning, clonidine 0.1 mg twice daily, lorazepam 1 mg every 6 hours as needed. Has started nortriptyline 75 mg nightly for about 3 weeks and DT level 176, reduced to 50 mg HS early November. Still the same sx as noted last visit.  Tolerating meds.   Compliant.  Still depressed and family notices.  Has been able to participate in family interactions.  Some post-show let down and has to do detailed work which is hard for her bc ADD.  Sleep and eating well.  Energy OK but not great.  No SI.  Not cried in a year or so.  Clearly less depressed and hopeless than before the Spravato.  09/24/22 appt noted: Received Spravato 84 mg today as scheduled.  Tolerated it well without nausea or vomiting headache or chest pain or palpitations.  Expected dissociation gradually resolved over the 2 hour observation period. She  feels 50% less depressed with Spravto and wants to continue it.   Continues meds Adderall XR 20 mg every morning, Wellbutrin XL 450 every morning, clonidine 0.1 mg twice daily, lorazepam 1 mg every 6 hours as needed. Has started nortriptyline 75 mg nightly for about 3 weeks and DT level 176, reduced to 50 mg HS early November. Still the same sx as noted last visit.  Tolerating meds.   Compliant.  Still depressed and family notices.  Has been able to participate in family interactions.  Some post-show let down and has to do detailed work which is hard for her bc ADD.  Sleep and eating well.  Energy OK but not great.  No SI.  Not cried in a year or so.  Clearly less depressed and hopeless than before the Spravato. Is not making further progress generally.  Stuck with moderate depression  10/02/22 appt noted: Received Spravato 84 mg today as scheduled.  Tolerated it well without nausea or vomiting headache or chest pain or palpitations.  Expected dissociation gradually resolved over the 2 hour observation period. She feels 50% less depressed with Spravto and wants to continue it.   Continues meds Adderall XR 20 mg every morning, Wellbutrin XL 450 every morning, clonidine 0.1 mg twice daily, lorazepam 1 mg every 6 hours as needed. Has started nortriptyline 75 mg nightly for about 3 weeks and DT level 176, reduced to 50 mg HS early November. Still the same sx as noted last visit.  Tolerating meds.   Compliant.  Still depressed and family notices.  Has been able to participate in family interactions.  Some post-show let down and has to do detailed work which is hard for her bc ADD.  Sleep and eating well.  Energy OK but not great.  No SI.  Not cried in a year or so.  Clearly less depressed and hopeless than before the Spravato. Is not making further progress generally.  Stuck with moderate depression.  Is able to function pretty normally. Plan: trial pramipexole 0.25 mg BID off label for depression.      10/08/22 appt noted: Received Spravato 84 mg today as scheduled.  Tolerated it well without nausea or vomiting headache or chest pain or palpitations.  Expected dissociation gradually resolved over the 2 hour observation period. She feels 50% less depressed with Spravto and wants to continue it.   Continues meds Adderall XR 20 mg every morning, Wellbutrin XL 450 every morning, clonidine 0.1 mg twice daily, lorazepam 1 mg every 6 hours as needed. Has started nortriptyline 75 mg nightly for about 3 weeks and DT level 176, reduced to 50 mg HS early November. Still the same sx as noted last visit.  Tolerating meds.   Compliant.  Still depressed and family notices.  Has been able to participate in family interactions.  Some post-show let down and has to do detailed work which is hard for her bc ADD.  Sleep and eating well.  Energy OK but not great.  No SI.  Not cried in a year or so.  Clearly less depressed and hopeless than before the Spravato. Is not making further progress generally.  Stuck with moderate depression.  Behaved and felt pretty normally with family over for Thanksgiving. Doesn't see benefit or SE with pramipexole but thinks maybe it makes her worse. Plan:  increase pramipexole augmentation off label to 0.5 mg BID  10/15/2022 appointment noted: Received Spravato 84 mg today as scheduled.  Tolerated it well without nausea or vomiting headache or chest pain or palpitations.  Expected dissociation gradually resolved over the 2 hour observation period. She feels 50% less depressed with Spravto and wants to continue it.   Continues meds Adderall XR 20 mg every morning, Wellbutrin XL 450 every morning, clonidine 0.2 mg twice daily, lorazepam 1 mg every 6 hours as needed.  Trazodone 50 mg tablets 1-2 nightly as needed insomnia Has started nortriptyline 75 mg nightly for about 3 weeks and DT level 176, reduced to 50 mg HS early November. Recommended increase pramipexole 0.5 mg twice daily off  label for treatment resistant depression on 10/08/2022. She feels better motivated more active with pramipexole 0.5 mg twice daily.  She still is depressed but it is better.  We discussed the possibility of going up in the dose but did not change it.  10/22/2022 appointment noted: Received Spravato 84 mg today as scheduled.  Tolerated it well without nausea or vomiting headache or chest pain or palpitations.  Expected dissociation gradually resolved over the 2 hour observation period. She feels 50% less depressed with Spravto and wants to continue it.   Continues meds Adderall XR 20 mg every morning, Wellbutrin XL 450 every morning, clonidine 0.2 mg twice daily, lorazepam 1 mg every 8 hours as needed.  Trazodone 50 mg tablets 1-2 nightly as needed insomnia Has started nortriptyline 75 mg nightly for about 3 weeks and DT level 176, reduced to 50 mg HS early November. pramipexole 0.5 mg twice daily off label for treatment resistant depression on 10/08/2022. She is better motivated than she was.  She is journaling 3 pages a day.  She has started walking and has walked 5 days a week for 50 minutes for the last 2 weeks.  That is significantly helped her mood.  Her mood tends to be better when she interacts with family.  However she still has some periods of depression.  She is tolerating the medications well.  She still feels like her affect and confidence is not back to normal. Plan:  increase pramipexole augmentation off label to 0.5 mg tID or 0.75 mg  BID   10/29/22 appt noted: Received Spravato 84 mg today as scheduled.  Tolerated it well without nausea or vomiting headache or chest pain or palpitations.  Expected dissociation gradually resolved over the 2 hour observation period. She feels 50% less depressed with Spravto and wants to continue it.   Continues meds Adderall XR 20 mg every morning, Wellbutrin XL 450 every morning, clonidine 0.2 mg twice daily, lorazepam 1 mg every 8 hours as needed.   Trazodone 50 mg tablets 1-2 nightly as needed insomnia Has started nortriptyline 75 mg nightly for about 3 weeks and DT level 176, reduced to 50 mg HS early November. pramipexole increased to 0.75 mg twice daily off label for treatment resistant depression  She has been feeling somewhat better with the increase in pramipexole and is tolerating it well.  She still is easily overwhelmed.  Her affect and mood can improve now when around her family or doing something positive.  She has been able to be more productive. She is tolerating the current medications. She is still considering TMS as an alternative to Spravato.  11/15/2022 appointment noted: Received Spravato 84 mg today as scheduled.  Tolerated it well without nausea or vomiting headache or chest pain or palpitations.  Expected dissociation gradually resolved over the 2 hour observation period. She feels 50% less depressed with Spravto and wants to continue it.   Continues meds Adderall XR 20 mg every morning, Wellbutrin XL 450 every morning, clonidine 0.2 mg twice daily, lorazepam 1 mg every 8 hours as needed.  Trazodone 50 mg tablets 1-2 nightly as needed insomnia Has started nortriptyline 75 mg nightly for about 3 weeks and DT level 176, reduced to 50 mg HS early November. pramipexole increased to 0.75 mg twice daily off label for treatment resistant depression  He has noticed some increase in depression due to the length of time since the last Spravato administration.  She believes Spravato is helping her.  He is not suicidal but has felt very blue the last few days. She is tolerating the medication. She is ambivalent about Spravato versus TMS but she is considering TMS. She wants to continue Spravato administration because it is clearly helpful.  ECT-MADRS    Flowsheet Row Clinical Support from 08/06/2022 in [facility] Clinical Support from 07/04/2022 in [facility] Clinical Support from 05/21/2022 in  [facility] Office Visit from 03/02/2022 in [facility]  MADRS Total Score 29 15 27  46        Past Psychiatric Medication Trials: fluoxetine, duloxetine, Viibryd, lamotrigine, Pristiq, sertraline, citalopram,  Trintellix anxious and SI Wellbutrin XL 450 [place] 1 dose  Adderall, Adderall XR, Vyvanse, Ritalin, Strattera low dose NR Lorazepam Trazodone  Depakote,  lamotrigine cog complaints Lithium remotely Abilify 7.5  Vraylar 1.5 mg daily agitation and insomnia Rexulti insomnia Latuda 40 one dose, CO anxious and SI Seroquel XR 300 Olanzapine 10  At visit November 12, 2019. We discussed Patient developed an increasingly severe alcohol dependence problem since her last visit in January.  She went to [place] and has had no alcohol since then except 1 day.  She never abused stimulants but they took her off the stimulants at [facility].  Her ADD was markedly worse.  The Wellbutrin did not help the ADD.   Chang history lamotrigine rash at 67 yo  Review of Systems:  Review of Systems  Constitutional:  Positive for fatigue.  Cardiovascular:  Negative for chest pain and palpitations.  Musculoskeletal:  Positive  for arthralgias and back pain. Negative for joint swelling.       SP hip surgery October 2020  Neurological:  Negative for dizziness, tremors and weakness.  Psychiatric/Behavioral:  Positive for decreased concentration and dysphoric mood. Negative for agitation, behavioral problems, confusion, hallucinations, self-injury, sleep disturbance and suicidal ideas. The patient is nervous/anxious. The patient is not hyperactive.     Medications: I have reviewed the patient's current medications.  Current Outpatient Medications  Medication Sig Dispense Refill   amLODipine (NORVASC) 2.5 MG tablet Take 2.5 mg by mouth daily.     amphetamine-dextroamphetamine (ADDERALL XR) 20 MG 24 hr capsule Take 1 capsule (20 mg total) by mouth every  morning. 30 capsule 0   amphetamine-dextroamphetamine (ADDERALL XR) 20 MG 24 hr capsule Take 1 capsule (20 mg total) by mouth every morning. 30 capsule 0   buPROPion (WELLBUTRIN XL) 150 MG 24 hr tablet TAKE 3 TABLETS BY MOUTH DAILY 270 tablet 1   cloNIDine (CATAPRES) 0.2 MG tablet Take 1 tablet (0.2 mg total) by mouth 2 (two) times daily. 60 tablet 2   Dextromethorphan HBr 15 MG CAPS Take 1 capsule (15 mg total) by mouth every 12 (twelve) hours. 60 capsule 0   Esketamine HCl, 84 MG Dose, (SPRAVATO, 84 MG DOSE,) 28 MG/DEVICE SOPK USE 3 SPRAYS IN EACH NOSTRIL ONCE A WEEK 3 each 0   iron polysaccharides (NIFEREX) 150 MG capsule TAKE 1 CAPSULE BY MOUTH EVERY DAY 90 capsule 1   LORazepam (ATIVAN) 1 MG tablet Take 1 tablet (1 mg total) by mouth every 8 (eight) hours as needed. for anxiety 90 tablet 0   losartan (COZAAR) 50 MG tablet Take 50 mg by mouth daily.     nebivolol (BYSTOLIC) 2.5 MG tablet Take 2.5 mg by mouth daily.     nortriptyline (PAMELOR) 25 MG capsule Take 2 capsules (50 mg total) by mouth at bedtime. 180 capsule 0   pramipexole (MIRAPEX) 0.5 MG tablet Take 1 tablet (0.5 mg total) by mouth 3 (three) times daily. 90 tablet 0   traZODone (DESYREL) 50 MG tablet TAKE 1-2 TABLETS BY MOUTH NIGHTLY AS NEEDED FOR SLEEP 180 tablet 1   No current facility-administered medications for this visit.    Medication Side Effects: None  Allergies:  Allergies  Allergen Reactions   Metronidazole Shortness Of Breath and Other (See Comments)    Heart pounding   Ferrlecit [Na Ferric Gluc Cplx In Sucrose] Other (See Comments)    Infusion reaction 05/12/2019    Past Medical History:  Diagnosis Date   ADHD    Anemia    Anxiety    Arthritis    Depression    Heart murmur    i went to see a cardiologit slast eyar  and i had zero plaque,    PONV (postoperative nausea and vomiting)    Recovering alcoholic in remission ([place])     Family History  Problem Relation Age of Onset   Atrial fibrillation  Mother    CAD Father     Past Medical History, Surgical history, Social history, and Family history were reviewed and updated as appropriate.   Please see review of systems for further details on the patient's review from today.   Objective:   Physical Exam:  There were no vitals taken for this visit.  Physical Exam Constitutional:      General: She is not in acute distress. Neurological:     Mental Status: She is alert and oriented to person, place, and time.  Coordination: Coordination normal.     Gait: Gait normal.  Psychiatric:        Attention and Perception: Attention and perception normal.        Mood and Affect: Mood is anxious and depressed. Affect is not labile, blunt, angry or tearful.        Speech: Speech is not rapid and pressured, slurred or tangential.        Behavior: Behavior is not slowed.        Thought Content: Thought content is not paranoid or delusional. Thought content does not include homicidal or suicidal ideation. Thought content does not include suicidal plan.        Cognition and Memory: Cognition normal. Memory is not impaired. She does not exhibit impaired recent memory.        Judgment: Judgment normal.     Comments: Insight intact. No auditory or visual hallucinations. No delusions.   Affect better with more spontaneity  now vs before Spravato.  less blunted No Sui intent plan      Lab Review:     Component Value Date/Time   NA 137 01/12/2021 1430   NA 140 11/18/2018 1544   K 3.8 01/12/2021 1430   CL 108 01/12/2021 1430   CO2 22 01/12/2021 1430   GLUCOSE 94 01/12/2021 1430   BUN 14 01/12/2021 1430   BUN 20 11/18/2018 1544   CREATININE 0.82 01/12/2021 1430   CALCIUM 8.9 01/12/2021 1430   PROT 6.6 01/12/2021 1430   ALBUMIN 3.9 01/12/2021 1430   AST 12 (L) 01/12/2021 1430   ALT 11 01/12/2021 1430   ALKPHOS 46 01/12/2021 1430   BILITOT 0.5 01/12/2021 1430   GFRNONAA >60 01/12/2021 1430   GFRAA >60 09/02/2019 0249   GFRAA >60  01/27/2019 0811       Component Value Date/Time   WBC 4.5 01/12/2021 1430   RBC 4.32 01/12/2021 1430   HGB 12.8 01/12/2021 1430   HGB 12.9 07/17/2019 0953   HCT 38.5 01/12/2021 1430   HCT 21.9 (L) 12/25/2018 1221   PLT 272 01/12/2021 1430   PLT 286 07/17/2019 0953   MCV 89.1 01/12/2021 1430   MCH 29.6 01/12/2021 1430   MCHC 33.2 01/12/2021 1430   RDW 12.4 01/12/2021 1430   LYMPHSABS 1.4 01/12/2021 1430   MONOABS 0.4 01/12/2021 1430   EOSABS 0.0 01/12/2021 1430   BASOSABS 0.0 01/12/2021 1430    No results found for: "POCLITH", "LITHIUM"   No results found for: "PHENYTOIN", "PHENOBARB", "VALPROATE", "CBMZ"   .res Assessment: Plan:    Recurrent major depression resistant to treatment ([place])  Generalized anxiety disorder  Attention deficit hyperactivity disorder (ADHD), predominantly inattentive type  Insomnia due to mental condition  Accelerated hypertension   She has treatment resistant major depression at this time.  Have  discussed some of her recent abnormal behaviors leading to this depressive episode getting worse which she says were associated with heavy use of delta 8 and not a manic episode.  She realizes now that that was not good for her.  She stopped all use of other drugs including those available over-the-counter such as delta 8 or any other THC related products.  She is no longer having any of those types of behaviors and instead is depressed.  She remains depressed with lack of interest and lack of feeling for things that normally she would have feelings about.  She has low motivation and energy.  She is difficulty making decisions.  She is sad and  down.  She is less productive than usual.  Her concentration is poor.  She has high degree of anxiety as well.  Her personality is more flat than normal for her.. She has a very negative self-image and low self-esteem.  These are all uncharacteristic for her However all of these symptoms are partially improved  through Spravato and the switch from Seroquel to olanzapine.  She is approximately 50% better.  Patient was administered Spravato 84 mg intranasally today.  The patient experienced the typical dissociation which gradually resolved over the 2-hour period of observation.  There were no complications.  Specifically the patient did not have nausea or vomiting or headache.  Blood pressures monitored at the 40-minute and 2-hour follow-up intervals.  Borderline high.  By the time the 2-hour observation period was met the patient was alert and oriented and able to exit without assistance.  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  See nursing note for further details.She wants to continue Spravato. We discussed discussed the side effects in detail as well as the protocol required to receive Spravato.   Failed multiple antidepressants.  Many of them were not actual failures but intolerances and it is unclear whether some of that was more connected with anxiety than true side effects.  1 example is the [place].  In general she does not want to try anything but an antidepressant but has failed all major categories of antidepressants except TCAs and MAO inhibitors which have not been tried until now. There is a consistent pattern of thinking she is worse with medication which is not consistent with objective evidence.    Her self assessment is clouded by her depression.  Started nortriptyline 75 mg nightly.  Serum level 176.  Reduced to 50 mg HS  Discussed side effects in detail.  Needs more time to help. Extensive discussion previously about her ambivalence about meds and missatributing sx of depression as SE of meds.    continue Wellbutrin XL 450 mg AM  Off label augmentation DM at a lower dose of 15 mg BID-defer longer  Recently increase pramipexole augmentation off label to 0.5 mg tID or 0.75 mg BID  Disc SE.  Looks  like partial response with this.  Started Spravato 84 mg twice weekly on 03/16/2022.  Now on weekly administration  Adderall  XR 20 mg AM   Ativan 1 mg 3 times daily as needed anxiety but try to cut it back. Is not ideal to use benzodiazepine with stimulant but because of the severity of her symptoms it has been necessary.  Hope to eventually eliminate the benzodiazepine.  Expected as her depression improves her anxiety will improve as well.  However lately her anxiety has been unmanageable.  We will expect that to improve as the depression improves.  She has headed insturctions to reduce this.  Continue clonidine 0.2 mg BID off label for anxiety and helps BP partially. BP is better controlled but not consistent.  Consider increasing amlodipine.  Also on losartan 50  Discussed potential benefits, risks, and side effects of stimulants with patient to include increased heart rate, palpitations, insomnia, increased anxiety, increased irritability, or decreased appetite.  Instructed patient to contact office if experiencing any significant tolerability issues. She wants to return to usual dose of Adderall for ADD bc of mor poor cognitive function with reduction.  Also discussed that depression will impair cognitive function.  [place] consultation was initially denied .  She says she called [facility] about this but they have not scheduled an appt .Marland Kitchen   Consider consultation with [facility] if not better by New year.  We discussed the pros and cons of this versus Spravato again.  For now she wants to continue Spravato because it is clearly helpful.  Extensive discussion about her chronic ambivalence about psychiatric medicines and tendency to blame medicine for the symptoms of depression that she had prior to even starting the medications.  After discussing this at length she was able to acknowledge that that is factual.  She does not appear to be having any significant side effects with the current medications.   However so far not much benefit with nortriptyline despite adequate level.  Has Maintained sobriety  No further med changes until after the holidays and consider.  FU with Spravato weekly   Lynder Parents, MD, DFAPA     Please see After Visit Summary for patient specific instructions.  Future Appointments  Date Time Provider [place]  11/19/2022  9:30 AM Cottle, Billey Co., MD [facility] None  11/19/2022  9:30 AM CP-NURSE [facility] None                No orders of the defined types were placed in this encounter.      -------------------------------

## 2022-11-19 ENCOUNTER — Ambulatory Visit: Payer: 59

## 2022-11-19 ENCOUNTER — Ambulatory Visit (INDEPENDENT_AMBULATORY_CARE_PROVIDER_SITE_OTHER): Payer: 59 | Admitting: Psychiatry

## 2022-11-19 VITALS — BP 144/86 | HR 74

## 2022-11-19 DIAGNOSIS — F339 Major depressive disorder, recurrent, unspecified: Secondary | ICD-10-CM

## 2022-11-19 DIAGNOSIS — F900 Attention-deficit hyperactivity disorder, predominantly inattentive type: Secondary | ICD-10-CM

## 2022-11-19 DIAGNOSIS — F411 Generalized anxiety disorder: Secondary | ICD-10-CM | POA: Diagnosis not present

## 2022-11-19 DIAGNOSIS — I10 Essential (primary) hypertension: Secondary | ICD-10-CM

## 2022-11-19 DIAGNOSIS — F5105 Insomnia due to other mental disorder: Secondary | ICD-10-CM | POA: Diagnosis not present

## 2022-11-23 ENCOUNTER — Other Ambulatory Visit: Payer: Self-pay | Admitting: Psychiatry

## 2022-11-23 NOTE — Progress Notes (Signed)
NURSES NOTE:   Patient arrived for her 62th Spravato treatment. Pt is being treated for Treatment Resistant Depression, pt will be receiving 84 mg which will continue to be her maintenance dose, she receives weekly treatments.  Pt is also planning on [place] maybe the first of the year. Patient arrived and taken to treatment room. Confirmed she had a ride home which is her husband would be coming back to pick up pt when done and sometimes she needs to use [facility] if he is unable to pick her up. Pt's Spravato is ordered through [employer] and delivered to office, all Spravato medication is stored at doctors office per REMS/FDA guidelines. The medication is required to be locked behind two doors per FDA/REMS Protocol. Medication is also disposed of properly per regulations.  Began taking patient's vital signs at 10:10 AM 143/96, pulse 58, Pulse Ox 98%, Gave patient first dose 28 mg nasal spray, each nasal spray administered in each nostril as directed and waited 5 minutes between the second and third dose. All 3 doses given pt did not complain of any nausea/vomiting, given a cup of water due to the taste after the administration of Spravato.  She listens to [employer] with spa or relaxing music.  Checked 40 minute vitals at 11:00 AM, 126/72, pulse 55, Pulse Ox 92%. Explained she would be monitored for a total time of 120 minutes. Discharge vitals were taken at 12:05 PM 100/68 P 70, 99% Pulse Ox. Dr. Clovis Pu met with pt today and discussed her medication. I walked pt to elevator, where her husband met her for the ride home. Recommend she go home and sleep or just relax on the couch. No driving, no intense activities. Verbalized understanding. Nurse was with pt a total of 70 minutes for clinical. Pt is scheduled January 15th, Monday 2024. Pt instructed to call office with any problems or questions.      LOT [id number] EXP OCT 2026

## 2022-11-23 NOTE — Telephone Encounter (Signed)
Spravato refill request

## 2022-11-23 NOTE — Progress Notes (Signed)
NURSES NOTE:   Patient arrived for her 35nd Spravato treatment. Pt is being treated for Treatment Resistant Depression, pt will be receiving 84 mg which will continue to be her maintenance dose, she receives weekly treatments.  Pt is also planning on [place] as soon as insurance will approve her treatments, then she will stop Spravato at that time. Patient arrived and taken to treatment room. Confirmed she had a ride home which is her husband would be coming back to pick up pt when done and sometimes she needs to use [facility] if he is unable to pick her up. Pt's Spravato is ordered through [employer] and delivered to office, all Spravato medication is stored at doctors office per REMS/FDA guidelines. The medication is required to be locked behind two doors per FDA/REMS Protocol. Medication is also disposed of properly per regulations.      Began taking patient's vital signs at 1:15 PM 170/98, pulse 55, Pulse Ox 98%, After consulting with Dr. Clovis Pu he advised to go ahead and start her treatment since her B/P usually goes down during her treatments.  Gave patient first dose 28 mg nasal spray, each nasal spray administered in each nostril as directed and waited 5 minutes between the second and third dose. All 3 doses given pt did not complain of any nausea/vomiting, given a cup of water due to the taste after the administration of Spravato.  She listens to [employer] with spa or relaxing music.  Checked 40 minute vitals at 2:05 PM, 124/79, pulse 65, Pulse Ox 94%. Explained she would be monitored for a total time of 120 minutes. Discharge vitals were taken at 3:25 PM 153/99 P 61, 97% Pulse Ox. Dr. Clovis Pu met with pt today and discussed her medication and her plans for [place]. I walked pt to elevator, where her husband met her for the ride home. Recommend she go home and sleep or just relax on the couch. No driving, no intense activities. Verbalized understanding. Nurse was with pt a total of 70 minutes for clinical.  Pt is coming back next week, Monday. Pt instructed to call office with any problems or questions.      LOT [id number] EXP AUG 2025

## 2022-11-24 NOTE — Progress Notes (Signed)
NURSES NOTE:   Patient arrived for her 47th Spravato treatment. Pt is being treated for Treatment Resistant Depression, pt will be receiving 84 mg which will continue to be her maintenance dose, she receives weekly treatments.  Pt is also planning on [place] soon. Patient arrived and taken to treatment room. Confirmed she had a ride home which is her husband would be coming back to pick up pt when done and sometimes she needs to use [facility] if he is unable to pick her up. Pt's Spravato is ordered through [employer] and delivered to office, all Spravato medication is stored at doctors office per REMS/FDA guidelines. The medication is required to be locked behind two doors per FDA/REMS Protocol. Medication is also disposed of properly per regulations.   Began taking patient's vital signs at 10:05 AM 115/88, pulse 78, Pulse Ox 98%, Gave patient first dose 28 mg nasal spray, each nasal spray administered in each nostril as directed and waited 5 minutes between the second and third dose. All 3 doses given pt did not complain of any nausea/vomiting, given a cup of water due to the taste after the administration of Spravato.  She listens to [employer] with spa or relaxing music.  Checked 40 minute vitals at 10:45 AM, 118/86, pulse 71, Pulse Ox 92%. Explained she would be monitored for a total time of 120 minutes. Discharge vitals were taken at 12:00 PM 107/76 P 68, 99% Pulse Ox. Dr. Clovis Pu met with pt today and discussed her medication. I walked pt to elevator, where her husband met her for the ride home. Recommend she go home and sleep or just relax on the couch. No driving, no intense activities. Verbalized understanding. Nurse was with pt a total of 70 minutes for clinical. Pt is scheduled January 8th, Monday 2024. Pt instructed to call office with any problems or questions.      LOT [id number] EXP OCT 2026

## 2022-11-25 ENCOUNTER — Encounter: Payer: Self-pay | Admitting: Psychiatry

## 2022-11-25 NOTE — Progress Notes (Addendum)
Laura Chang [id number] 06/19/1956 67 y.o.  Subjective:   Patient ID:  Laura LookMittie D Balli is a 67 y.o. (DOB 05/12/1956) female.  Chief Complaint:  Chief Complaint  Patient presents with   Follow-up   Depression   Anxiety   ADD     HPI Laura Chang  Laura Chang presents to the office today for follow-up of depression and anxiety and ADD.  seen November 12, 2019.  Melted down in 2020.  [facility].  No withdrawal.  1 drink since then.  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 She was on no stimulant and no SSRI but was taking Strattera and Wellbutrin.  The following changes were made. Stop Strattera. OK restart stimulant bc severe ADD Restart Adderall 1 daily for a few days and if tolerated then restart 1 twice daily. If not tolerated reduce the dosage if needed. May need to stop Wellbutrin if not tolerating the stimulant.  Yes.  DC Wellbutrin Restart Prozac 20 mg daily.  February 2021 appointment with the following noted: Completed grant proposal.  Couldn't doit without Adderall.  Sold a bunch of work.   Adderall XR lasts about 3 pm.  Strength seems about right.  BP been OK.  Not jittery.   Stopped Wellbutrin but had no SE. Mood drastically better with grant proposal and back on fluoxetine.  Less depressed and lethargic.  No anxiety.  Cut back on coffee. Started back with devotions and stronger faith. Plan: Continue Prozac 20 mg daily. May have to increase the dose at some point in the future given that she usually was taking higher dosages but she is getting good response at this time. Restart Wellbutrin off label for ADD since can't get 2 ADDERALL daily. 150 mg daily then 300 mg daily. She can adjust the dose between 150 mg and 300 mg daily to get the optimal effect.   05/11/2020 appointment with the following noted: Has been inconsistent with Prozac and Wellbutrin. Not sure of the effect of Wellbutrin. Biggest deterrent in work is anxiety.   Some of the work is conceptual and difficult at times.  Can feel she's not up to a project at times.  Overall is OK but would like a steadier benefit from stimulant.  Exhausted from managing concentration and keeping up with things from the day.  Loses things.  Not good keeping up with schedule. Overall productive and emotionally OK. Can feel Adderall wear off. Mood is better in summer and worse in the winter.   F died in November and that is a loss. No SE Wellbutrin. Still attends AA meetings.  [facility]. Recognizes effect of anemia on ADD and mood.  Had iron infusions last winter. Plan:  Wellbutrin off label for ADD since can't get 2 ADDERALL daily. 150 mg daily then 300 mg daily.  01/24/2021 appointment with following noted: Doing a program called [employer] mindfulness app since Xmas.  CBT app helped the depression.  App helped her focus better.  Lost sign weight. Writing a lot. Before Xmas felt depressed and started negative thinking worse, self denigrating. Not drinking. More isolated.   Recognizes mo is narcissist.    Didn't tell anyone she was born until 3 mos later.  M aloof and uninterested in pt.  Lied about her birthday.  Mo lack of affection even with pt's kids. Going to AA for a year and it helped her to quit drinking. Also misses kids being gone  with a hole also. Plan: No med changes  05/04/2021 appointment with the following noted: Therapist [name] thinks she's manic. Lost weight to 144#.   States she is still sleeping okay.  Admits she is hyper and recognizes that she is likely manic.  She feels great, euphoric with an increased sense of spiritual connectedness to God.  She has racing thoughts and talks fast and talks a lot and this is noted by her husband.  He thinks she is a bit hyper.  She has been able to maintain sobriety although she will have 1 glass of wine on special occasions but does not drink by herself.  She is not drinking to  excess.  She denies any dangerous impulsivity.  She is clearly not depressed and not particularly anxious.  She has no concerns about her medication and she has been compliant.  06/16/21 appt noted: So much better.  Going through a lot but the manic thing happened on top of it.  So much slower.  Didn't feel like losing anything with risperidone.  Likes the Adderalll at 10 mg. Some drowsiness in the AM and very drowsy from risperidone 2 mg HS. Prayer life is better. Handling stress better. Less depressed with risperidone. Still likes trazodone. Sleeps well. Plan: Reduce Prozac to 10 mg daily.  Consider stopping it because it can feel the mania however she is reluctant to do that because she fears relapse of depression. Reduce risperidone to 1.5 mg nightly due to side effects.  Discussed risk of worsening mania.  07/25/2021 appointment with the following noted: Misses the Adderall and hard to function without it. Depressed now. Heavy chest.  Anxious and guilty.  Body feels heavy.   Hates Wellbutrin.   Plan: Increase fluoxetine to 20 mg daily Add Abilify 1/2 of 15 mg tablet daily Wean wellbutrin by 1 tablet each week  bc she feels it is not helpful and DT polypharmacy Reduce risperidone to 1 daily for 1 week and stop it. Disc risk of mania. Increase Adderall to XR 20 mg AM  08/08/21 Much less depressed and starting to feel normal I feel a lot better. No SE.  Speech normal off risperidone. Sleeping OK on trazaodone and enough.   Noticed benefit from Adderall again. Plan: continue fluoxetine to 20 mg daily Continue Abilify 1/2 of 15 mg tablet daily for depression and mania continue Adderall to XR 20 mg AM  10/10/2021 phone call: Pt stated she feels like the Abilify should be decreased to 23m.She said she is depressed but rational and not suicidal.She has an appt Monday and can wait until then if you prefer. MD response: Reduce the Abilify to 7.5 mg every other day.  We will meet on 10/16/2021  and decide what to do from there.  10/16/2021 appointment with the following noted: More depressed.  Most depressed I've ever been.  Just numb.  Sense of grief.   Thinks the manic episode was unlike anything else she ever had.  Doesn't want to medicate against it.  Don't enjoy people.  Easily overwhelmed.  Had some death thoughts but not suicidal.  Has been functional.  Feels better today after reducing Abilify to every other day but she is only been doing that for 3 days. A/P: Episode of post manic depression was explained. continue fluoxetine to 20 mg daily Hold Abilify for 1 week then resume Abilify 1/2 of 15 mg tablet every other day for depression and mania continue Adderall to XR 20 mg AM  10/27/2021  appointment with the following noted: I'm doing so much better.  Handling the depression better. Better self talk and spiritual focus has helped.   Dep 6/10 manifesting as anxiety with low confidence.   F died 2  years ago and M 67 yo and is dependent . She is working hard to feel better but still feels depressed.  She almost feels like she has a little more anxiety since restarting Abilify every other day. Plan: continue fluoxetine to 20 mg daily DC Abilify .  Vrayalar 1.5 mg QOD to try to get rid of depression ASAP. continue Adderall to XR 20 mg AM  11/10/2021 appointment with the following noted: Busy with Xmas and it was fun with family but then a big let down.  Did well with it.  Functioned well with it.  Working hard on things with depression.  Not shutting down. Not sure but feels better today but yesterday was hard.  Difficulty dealing with mother.  She won't do anything to help herself.  Yesterday with her all day.  Won't do PT and has isolated herself.    Lack of confidence.   No SE with Vraylar.  11/24/21 urgent appointment appt noted: More and more depressed.   So anxious and doesn't want to be alone but can do so. No appetite. Hurts inside. Has had some fleeting suicidal  thoughts but would not act on them.  Tolerating meds. Has been consistent with Vraylar 1.5 mg every other day, fluoxetine 20 mg daily Plan: Increase Vraylar to 1.5 mg daily Change Prozac to Trintellix 10 mg daily. Discussed side effects of each continue Adderall to XR 20 mg AM  12/27/2021 appointment with the following noted: Not OK.  I feel less depressed but feels bat shit. Not sleeping well.  Extremely anxious. Off and on sleep. 3-4 hours of sleep.   Still having daily SI.  But also become obvious has so much to do.  Overwhelmed by tasks.   Needs anxiety meds to just function. Not more motivated.  Walked yesterday.   Feels afraid like in trouble but not irritable or angry. DC DT agitation Vraylar to 1.5 mg daily Change Prozac to Trintellix 10 mg daily. Hold Adderall to XR 20 mg AM Clonidine 0.1 1/2 tablet twice daily for 2 days and if needed for anxiety and sleep increase to 1 twice daily Ok temporary Ativan 1 mg 3 times daily as needed anxiety  01/05/22 appt noted: Off fluoxetine and  Trintellix.  Only on Ativan, trazodone and Adderall XR 20 plus added clonidine 0.1 mg BID Didn't think she needed to start Trintellix. Not taking Ativan.   Didn't like herself last week. Feels some better today. Wonders if the manic sx Not agitated.  Anxiety kind of calmed down.  A lot to be anxious about situationally.  $ stress. Concerns about downers with meds. Can't access normal personality. ? Lethargy and inability to talk as sE. Plan: Latuda 20-40 mg daily with food. Adderall to XR 20 mg AM Clonidine 0.1 1/2 tablet twice daily  reduce dose to be sure no SE Ok temporary Ativan 1 mg 3 times daily as needed anxiety  01/19/22 appt noted: Taking Latuda 20 mg daily.  Took 40 mg once and felt anxious and  SI Still depressed and not very reactive Anxiety mainly about the depression and fears of the future. She wants to revisit manic sx and thinks it was maybe bc taking delta 8 bc was taking a lot  of it so still  doesn't think she's classic bipolar. She wants to only take Prozac bc thinks Latuda is perpetuating depression. Says the delta 8 was very psychaedelic.  When not taking it was not manic.  Sleeping ok again.  Plan: Per her request DC Latuda 20-40 mg daily with food. She wants to continue Prozac alone AMA  Adderall to XR 20 mg AM Clonidine 0.1 1/2 tablet twice daily  reduce dose to be sure no SE Ok temporary Ativan 1 mg 3 times daily as needed anxiety  01/23/2022 phone call complaining of increased anxiety since stopping Latuda.  She will try increasing clonidine.  01/26/2022 phone call not feeling well and wanted to restart the Vraylar.  However notes indicate that had made her agitated therefore she was encouraged to pick up samples of Rexulti 1 mg and start that instead.  02/06/2022 phone call: Stating she felt the Rexulti was helping with depression but she was not sleeping well and obsessing over things.  She was encouraged to increase Rexulti to 2 mg daily and increase trazodone for sleep.  02/09/2022 appointment with the following noted: This was an urgent work in appointment No sleep last night with trazodone 100 mg HS Nothing really better depression or anxiety. Ruminating negative anxious thoughts. Did not tolerate Rexulti because it was causing insomnia.  Does not think it helped depression.  Lacks emotion that she should have.  Lacks her usual personality.  Some hopeless thoughts.  Some death thoughts.  Some suicidal thoughts without plan or intent Plan: DC Rexulti and Prozac & DC trazodone Adderall to XR 20 mg AM Clonidine 0.1 1/tablet twice daily  reduce dose to be sure no SE Ok temporary Ativan 1 mg 3 times daily as needed anxiety Start Seroquel XR 150 mg nightly  03/02/2022 appointment: Langley Gauss called back a few days after starting Seroquel stating it was making her more anxious and more depressed.  This seemed unlikely as this medicine rarely ever causes anxiety.   She stopped the medication waited 3 days and called back still had anxiety and depression but thought perhaps the anxiety was a little better.  She did not want to take the Seroquel. She knew about the option of Spravato and wanted to pursue that. Now questions whether to return to Seroquel while waiting to start Spravato bc feels just as bad without it and knows she didn't give it enough time to work.   MADRS 46  ECT-MADRS    Flowsheet Row Clinical Support from 08/06/2022 in [place] from 07/04/2022 in [place] from 05/21/2022 in [place] Visit from 03/02/2022 in [facility]  MADRS Total Score _0 46      03/14/22 appt noted: Pt received Spravato 56 mg first dose today with some dissociative sx which were not severe.  She was anxious prior to the administration and felt better after receiving lorazepam 1 mg.  No NV, or HA. Wants to continue Spravato. Ongoing depression and desperate to feel better.  I'm not myself DT deprsssion which is most severe in recent history.  Anhedonia.  Low motivation.  Social avoidance. Continues to think all recent med trials are making her worse.  Sleep ok with Seroquel.  03/16/22 appt noted: Received Spravato 84 mg for the first time.  some dissociative sx which were not severe.  She was anxious prior to the administration and felt better after receiving lorazepam 1 mg.  No NV, or HA. Wants to continue Spravato.  Does not feel any better or different since the last appt.  Ongoing depression.  Ongoing depression and desperate to feel better.  I'm not myself DT deprsssion which is most severe in recent history.  Anhedonia.  Low motivation.  Social avoidance. Continues to think all recent med trials are making her worse.  Sleep ok with Seroquel.  Does not want to continue Seroquel for TRD.  03/20/2022 appointment noted: Came for Spravato  administration today.  However blood pressure was significantly elevated approximately 180/115.  She was given lorazepam 1 mg and clonidine 0.2 mg to try to get it down. She states she regretted stopping the Seroquel XR 300 mg tablets.  She now realizes it was helpful.  She did not sleep much at all last night.  She did not take the Adderall this morning. 2 to 3 hours after arrival blood pressure was still elevated at  170/110, 62 pulse.  For Spravato administration was canceled for today.  She admits to being anxious and depressed.  She is not suicidal.  She is highly motivated to receive the Spravato.  We discussed getting it tomorrow.  03/22/2022 appointment noted: Patient's blood pressure was never stable enough yesterday in order to get her in for Spravato administration.  She was encouraged to see her primary care doctor.  It is better today.  03/26/2022 appointment with the following noted: Blood pressure was better.  Saw her primary care doctor who started on oral Bystolic 2.5 mg daily. Received Spravato 84 mg today as scheduled.  Tolerated it well without nausea or vomiting headache or chest pain or palpitations.  Her blood pressure was borderline but manageable. She remains depressed and anxious.  She is ambivalent about the medicine and desperate to get to feel better.  Continues to have anhedonia and low energy and low motivation and reduced ability to do things.  Less social.  Not suicidal.  03/28/22 appt noted: Received Spravato 84 mg today as scheduled.  Tolerated it well without nausea or vomiting headache or chest pain or palpitations.  Her blood pressure was borderline but manageable. Has not seen any improvement so far.  Tolerating Seroquel.  Inconsistent with Bystolic and BP has been borderline high. Still depressed and anxious and anhedonia.  Low motivation, energy, productivity. Taking quetiapine and tolerating XR 300 mg nightly.  04/04/22 appt noted: Received Spravato 84 mg  today as scheduled.  Tolerated it well without nausea or vomiting headache or chest pain or palpitations.  Her blood pressure was borderline but manageable. Has not seen any improvement so far.  Tolerating Seroquel.   She still tends to think that the medications are making her worse.  She has said this about each of the recent psychiatric medicines including Seroquel.  However her husband thinks she is improved.  She also admits there is some improvement in productivity.  She still feels highly anxious.  She still does not enjoy things as normal.  She still feels desperate to improve as soon as possible. Has been taking Seroquel XR since 03/20/2022  04/10/22 appt noted: Received Spravato 84 mg today as scheduled.  Tolerated it well without nausea or vomiting headache or chest pain or palpitations.  Her blood pressure was borderline but manageable. Has not seen any improvement so far.  Tolerating Seroquel.  Doesn't like Seroquel bc she thinks it flattens here. 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  Tolerated it well without nausea or vomiting headache or chest pain or palpitations.  Her blood pressure was borderline but manageable. Has not seen any improvement so far.  Tolerating Seroquel.  Doesn't like Seroquel bc she thinks it flattens her. Received Spravato 84 mg today as scheduled.  Tolerated it well without nausea or vomiting headache or chest pain or palpitations.  Her blood pressure was borderline but manageable. Has not seen any improvement so far.  Tolerating Seroquel.  Doesn't like Seroquel bc she thinks it flattens here.  We discussed her ambivalence about it. She is starting Auvelity and has tolerated it the last 2 days without side effect.  She still does not feel like herself and feels flat and not enjoying things  with suppressed expressed emotion  04/17/2022 appointment with the following noted: Received Spravato 84 mg today as scheduled.  Tolerated it well without nausea or vomiting headache or chest pain or palpitations.  Her blood pressure was borderline but manageable. Has not seen any improvement so far.  Tolerating Seroquel.  Doesn't like Seroquel bc she thinks it flattens her. She has been tolerating the Auvelity 1 in the morning without side effects for about a week.  She has not noticed significant improvement so far.  She still feels depressed and flat and not herself.  Other people notice that she is flat emotionally.  She is not suicidal.  She does feel discouraged that she is not getting better yet.  04/19/2022 appointment noted: Has increased Auvelity to 1 twice daily for 2 days, continues quetiapine XR 300 mg nightly, clonidine 0.3 mg twice daily, lorazepam 1 mg twice daily for anxiety and Adderall XR 20 mg in the morning. No obious SE but she still thinks quetiapine XR is making her feel down.  But not sedated Received Spravato 84 mg today as scheduled.  Tolerated it well without nausea or vomiting headache or chest pain or palpitations.  Her blood pressure was borderline but manageable. She still feels quite anxious and feels it necessary to take both the clonidine and lorazepam twice a day to manage her anxiety.  She has been consistently down and flat and not herself until yesterday afternoon she noted an improvement in mood and feeling much more like herself with her normal personality reemerging.  She was quite depressed in the morning with very dark negative thoughts.  She did not have those dark negative thoughts this morning.  She had a lot of questions about medication and when she was expecting to be improved and why she has not shown improvement up to now.  04/23/22 appt noted: Has increased Auvelity to 1 twice daily for 1 week, continues quetiapine XR 300 mg nightly, clonidine 0.3 mg  twice daily, lorazepam 1 mg twice daily for anxiety and Adderall XR 20 mg in the morning. No obious SE but she still thinks quetiapine XR is making her feel down.  But not sedated Received Spravato 84 mg today as scheduled.  Tolerated it well without nausea or vomiting headache or chest pain or palpitations.  She is still depressed but admits better function and is able to enjoy social interactions. Tolerating meds.  Would like to feel better for sure. Not herself.  Flat. Plan increase Auvelity to 1 tab BID as planned and reduce Quetiapine to 1/2 of ER 300 mg  bc NR for depression.  04/25/2022 appointment with the following noted: clonidine 0.3 mg twice daily, lorazepam 1 mg twice daily for anxiety and Adderall XR  20 mg in the morning. Seroquel XR 300 HS No obious SE but she still thinks quetiapine XR is making her feel down.  But not sedated Received Spravato 84 mg today as scheduled.  Tolerated it well without nausea or vomiting headache or chest pain or palpitations.  Called yesterday with more anxiety.  Had increased Auvelity for 1 day and reduced Seroquel XR for 1 day.  Felt restless and fearful  05/01/2022 appointment noted: clonidine 0.3 mg twice daily, lorazepam 1 mg twice daily for anxiety and Adderall XR 20 mg in the morning. Seroquel XR 150 HS, Auvelity 1 BID Received Spravato 84 mg today as scheduled.  Tolerated it well without nausea or vomiting headache or chest pain or palpitations.  Nurse has noted patient has called multiple times sometimes asking the same question repeatedly.  It is unclear whether she is truly forgetful or is just anxious seeking reassurance. Patient acknowledges ongoing depression as well as some anxiety but states she has felt a little better in the last couple of days.  She has reduced the Seroquel to 150 mg at night and has increased Auvelity to 1 twice daily but only for 1 day.  So far she seems to be tolerating it.  05/03/22 appt noted: clonidine 0.2 mg  twice daily, lorazepam 1 mg twice daily for anxiety and Adderall XR 20 mg in the morning. Seroquel XR 150 HS, Auvelity 1 BID BP high this am about 170/100 and received extra clonidine 0.2 mg and came to receive Spravato.  Not dizzy, no SOB, nor CP but BP is still high Could not receive Spravato today bc BP high and pulse low at 30 ppm. Still depressed and anxious. Plan: continue trial Auvelity with Spravato She needs to get BP and pulse managed  05/08/22 TC: RTC  H Michael NA and mailbox full.  Could not leave message.  Pt  -  talked to she and H on speaker. H worried over wife.  Vacant stare.  Slurs words at times.  Not smiling. Reduced enjoyment.  Depression.  Withdrawn from usual activities.  Some irritability.  Anxious. Disc her concerns meds are making her worse.  Extensive discussion about her treatment resistant status.  There is a consistent pattern of not taking the medicines long enough to get benefit because she believes the meds are making her worse.  However the symptoms she describes as side effects are exactly the same symptoms that she had prior to taking the medication RX for  the depression.  So it is not clear that these are actual side effects. This is true about the 2 most recent meds including Seroquel and Auvelity.  Recommend psychiatric consultation in hopes of improving her comfort level with taking prescribed medications for a sufficient length of time to provide benefit. Extensive discussion about ECT is the treatment of choice for treatment resistant depression.  Spravato may work if she can comply with consistency.  There are medication options but they take longer to work.   Plan:  Reduce clonidine to 0.1 mg BID DT bradycardia.  Talk with PCP about BP and low pulse problems which are interfering with her consistent compliance with Spravato.   Limit lorazepam to 3 -4  mg daily max. Excess use is the cause of slurring speech.  She must stop excess use or will have to stop  the med. Stop Auvelity per her request.  But she has only been on the full dose for a little over a week and clearly has  not had time to get benefit from it.  She thinks maybe it is making her more anxious. Reduce Seroquel from 150XR to 50 -100 mg at night IR.  She couldn't sleep when stopped it completely. Will not start new antidepressant until her SE issues are resolved or not. Get second psych opinion from Yehuda Budd MD or another psychiatrist.  H's sister is therapist in [place], MD, [facility]  05/16/2022 appointment with the following noted: Received Spravato 84 mg today as scheduled.  Tolerated it well without nausea or vomiting headache or chest pain or palpitations.  She stopped Auvelity as discussed last week. On her own, without physician input, she restarted Wellbutrin XL 450 mg every morning today.  She had taken it in the past.  She feels jittery and anxious. She feels less depressed than she did last week.  But she is still depressed without her usual range of affect.  She still is less social and less motivated than normal. Her primary care doctor increased the dose of losartan Plan: Stop Seroquel Reduce Wellbutrin XL to 300 mg every morning.  Starting the dose at 450 every morning is likely causing side effects of jitteriness and it should not be started at that have a dosage. Recommend she not change meds on her own without MDM put  05/23/2022 appointment with the following noted: Received Spravato 84 mg today as scheduled.  Tolerated it well without nausea or vomiting headache or chest pain or palpitations.  Has not dropped seroquel XR 300 mg 1/2 tablet nightly bc couldn't sleep without it. Has not tried lower dose quetiapine 50 mg HS Still feels depressed.   BP is better managed so far, just saw PCP.  BP is better today and infact is low today. Dropped clonidine as directed from 0.3 mg BID bc inadequate control of BP to 0.2 mg BID.  However she wants to increase it  back to 0.3 mg twice daily because she feels it helped her anxiety better.  Wonders about increasing Wellbutrin for depression.  However she has only been on 300 mg a day for a week.  She was on 450 mg daily in the past.  06/06/22 appt noted: Received Spravato 84 mg today as scheduled.  Tolerated it well without nausea or vomiting headache or chest pain or palpitations.  She is still depressed and anxious.  She wants to try to stop the Seroquel but cannot sleep without some of it.  She is taking lorazepam 1 mg 4 times daily and still having a lot of anxiety.  She wants to increase clonidine back to 0.3 mg twice daily.  She hopes for more improvement She recently went for a second psychiatric opinion as suggested the results of that are pending.  06/11/22 appt noted: Received Spravato 84 mg today as scheduled.  Tolerated it well without nausea or vomiting headache or chest pain or palpitations.  She is still depressed and anxious. Without much change.  Still hopeless, anhedonia, reduced inteterest and motivation.  Tolerating meds. Disc concerns Spravato is not hleping much. Plan: stop Seroquel and start olanzapine 10 mg HS for TRD and anxiety.  06/13/2022 appointment noted: Received Spravato 84 mg today as scheduled.  Tolerated it well without nausea or vomiting headache or chest pain or palpitations.  She is still depressed and anxious. Without much change.  Still hopeless, anhedonia, reduced inteterest and motivation.  Tolerating meds. Disc concerns Spravato is not helping much as hoped but is improving a bit in the  last week. Tolerating meds. Continues Wellbutrin XL 450 AM, tolerating recently started olanzapine  10 mg HS. Sleep is good.   Pending appt with [place] consult.  06/18/22 appt noted: Received Spravato 84 mg today as scheduled.  Tolerated it well without nausea or vomiting headache or chest pain or palpitations.  Tolerating meds. Continues Wellbutrin XL 450 AM, tolerating recently started  olanzapine  10 mg HS. Continues Adderall XR 20 amd and has tried to reduce lorazepam to 46m TID Sleep is good.   [facility] [facility] [facility]. Depression is a little bit better in the last week with a little improvement in emotional expression and interest.  She is pushing herself to be more active.  Her daughter thought she was a little better than she has been.  However she is still depressed and still not her normal self with anhedonia and reduced emotional expressiveness.  06/20/22 appt noted: Received Spravato 84 mg today as scheduled.  Tolerated it well without nausea or vomiting headache or chest pain or palpitations.  Tolerating meds with a little sleepiness. Continues Wellbutrin XL 450 AM, tolerating recently started olanzapine  10 mg HS. Continues Adderall XR 20 amd and has tried to reduce lorazepam to 165mTID Sleep is good.   Mood is improving.  Better funciton.  Anxiety is better with olanzapine. Still not herself and depression not gone with some anhedonia and social avoidance and feeling overwhelmed.  8/14 2023 received Spravato 84 mg 06/27/2022 received Spravato Spravato 84 mg 07/02/2022 received Spravato 84 mg 07/04/2022 received Spravato 84 mg  07/09/2022 appointment noted: Received Spravato 84 mg today as scheduled.  Tolerated it well without nausea or vomiting headache or chest pain or palpitations.  Expected dissociation and feels less depressed with resolution of negative emotions immediately after Spravato and then depression, anxiety creep back in. Continues meds Adderall XR 20 mg every morning, Wellbutrin XL 450 every morning, clonidine 0.1 mg twice daily, lorazepam 1 mg every 6 hours as needed, olanzapine increased from 7.5 to 10 mg nightly on  Tolerating meds.  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  She does note some increased appetite and is  somewhat concerned about that but has not gained significant amounts of weight. [place] [facility] which was initially [place] but she knows it can be appealed.  However because she is improving with Spravato plus the other medications now she wants to continue the current treatment plan.  07/18/22 appt noted: Continues meds Adderall XR 20 mg every morning, Wellbutrin XL 450 every morning, clonidine 0.1 mg twice daily, lorazepam 1 mg every 6 hours as needed, olanzapine increased from 7.5 to 10 mg nightly on 07/04/2022. Received Spravato 84 mg today as scheduled.  Tolerated it well without nausea or vomiting headache or chest pain or palpitations.  Expected dissociation and feels less depressed with resolution of negative emotions immediately after Spravato and then depression, anxiety creep back in. Continues meds Adderall XR 20 mg every morning, Wellbutrin XL 450 every morning, clonidine 0.1 mg twice daily, lorazepam 1 mg every 6 hours as needed, olanzapine increased from 7.5 to 10 mg nightly on  Tolerating meds.  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  She does note some increased appetite and is somewhat concerned about that  but has not gained significant amounts of weight. She has had the [place] consultation which was initially denied but she knows it can be appealed. She continues to have chronic ambivalence about psychiatric medicines and initially tends to blame her depressive symptoms such as decreased concentration and feeling flat on what ever medicine she currently is taking even though she had the same symptoms before the current medicines were started.  Then after discussion she does admit that her depressive symptoms are improved since adding olanzapine but still has those residual symptoms noted.  07/23/22 received Spravato 84 mg   07/30/2022 appointment  noted: Received Spravato 84 mg today as scheduled.  Tolerated it well without nausea or vomiting headache or chest pain or palpitations.  Expected dissociation and feels less depressed with resolution of negative emotions immediately after Spravato and then depression, anxiety creep back in. Continues meds Adderall XR 20 mg every morning, Wellbutrin XL 450 every morning, clonidine 0.1 mg twice daily, lorazepam 1 mg every 6 hours as needed, olanzapine increased from 7.5 to 10 mg nightly on  She has been inconsistent with olanzapine because she continues to be ambivalent about the medications in general and thinks that perhaps the 10 mg is making her feel blunted.  She continues to feel some depression.  She had a good day this week and but still feels somewhat depressed and persistently anxious. Plan: be consistent with olanzapine 10 mg HS for TRD and longer trial for potential benefit for anxiety.  Has not taken it consistently.  08/06/2022 appointment noted: Received Spravato 84 mg today as scheduled.  Tolerated it well without nausea or vomiting headache or chest pain or palpitations.  Expected dissociation and feels less depressed with resolution of negative emotions immediately after Spravato and then depression, anxiety creep back in. Continues meds Adderall XR 20 mg every morning, Wellbutrin XL 450 every morning, clonidine 0.1 mg twice daily, lorazepam 1 mg every 6 hours as needed, olanzapine i 10 mg nightly  She continues to feel depressed but is about 50% better with Spravato.  She is still not herself.  She still has anhedonia.  She still is not her able to engage socially in the typical ways.  She is not jovial and outgoing like normal.  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  She continues to feel that her personality is dampened down.  There is a question about whether it is  related to depression or medication. Plan: continue olanzapine 10 for longer trial for TRD and severe anxiety.  08/13/22 appt noted:  Received Spravato 84 mg today as scheduled.  Tolerated it well without nausea or vomiting headache or chest pain or palpitations.  Expected dissociation and feels less depressed with resolution of negative emotions immediately after Spravato and then depression, anxiety creep back in. Continues meds Adderall XR 20 mg every morning, Wellbutrin XL 450 every morning, clonidine 0.1 mg twice daily, lorazepam 1 mg every 6 hours as needed, olanzapine i 10 mg nightly  She still does not feel herself.  Still struggles with depression and low motivation and reduced social engagement and reduced interest and reduced emotional expression.  She is somewhat better with the medicines plus Spravato.  She still believes the Spravato makes her blunted and is not sure how much it helps her anxiety.  She can have good days when her family is around and  she is engaged.  She still wants to stop the olanzapine. She has apparently continued to take the trazodone despite having been told to stop it when she started olanzapine.  She feels like she needs the trazodone. Plan: DC olanzapine and Start nortriptyline 25 mg nightly and build up to 75 mg nightly and then check blood level.    08/27/2022 appointment noted: Received Spravato 84 mg today as scheduled.  Tolerated it well without nausea or vomiting headache or chest pain or palpitations.  Expected dissociation and feels less depressed with resolution of negative emotions immediately after Spravato and then depression, anxiety creep back in. Continues meds Adderall XR 20 mg every morning, Wellbutrin XL 450 every morning, clonidine 0.1 mg twice daily, lorazepam 1 mg every 6 hours as needed. Stopped olanzapine and started nortriptyline which she has taken for about a week is 75 mg nightly. So far she is tolerating the nortriptyline well with the  exception of some dry mouth and constipation which she is working to manage.  She does not feel substantially better better or different off the olanzapine.  No change in her sleep which is good.  Main concern currently in addition to the residual depression is anxiety which is somewhat situational with pending arch show.  She is worrying about it more than normal.  Says she is having to take lorazepam twice a day where she had been able to keep reduce it prior to this.  She still does not feel like herself with residual depression with less social interest and less of her usual buoyancy in personality.  She is flatter than normal.  Overall she still feels that the Spravato has been helpful at reducing the severity of the depression.  She is not suicidal. She has not heard anything about the [place] appeal as of yet.  09/05/2022 appointment noted: Received Spravato 84 mg today as scheduled.  Tolerated it well without nausea or vomiting headache or chest pain or palpitations.  Expected dissociation and feels less depressed with resolution of negative emotions immediately after Spravato and then depression, anxiety creep back in. Continues meds Adderall XR 20 mg every morning, Wellbutrin XL 450 every morning, clonidine 0.1 mg twice daily, lorazepam 1 mg every 6 hours as needed. Stopped olanzapine and started nortriptyline which she has taken for about 2 week is 75 mg nightly. Initially blood pressure was a little high causing delay in starting Spravato.  She admitted to feeling a little wound up.  She still experiences a little increase in depression if she goes longer than a week in between doses of Spravato.  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  09/10/22 appt noted: Received Spravato 84 mg today as scheduled.  Tolerated it well without nausea or vomiting headache or chest pain or palpitations.   Expected dissociation gradually resolved over the 2 hour observation period. She feels 50% less depressed with Spravto and wants to continue it.   Continues meds Adderall XR 20 mg every morning, Wellbutrin XL 450 every morning, clonidine 0.1 mg twice daily, lorazepam 1 mg every 6 hours as needed. Has started nortriptyline 75 mg nightly for about 3 weeks. Has not seen a significant difference with the addition of nortriptyline.  Tolerating it pretty well. She continues to have some degree of anhedonia and significant depression and anxiety.  Her daughters noticed that she is more needy and  calls more frequently.  She acknowledges this as well.  She is clearly still not herself. Plan: pramipexole off label and RX 0.25 mg BID  09/17/2022 appointment noted: Received Spravato 84 mg today as scheduled.  Tolerated it well without nausea or vomiting headache or chest pain or palpitations.  Expected dissociation gradually resolved over the 2 hour observation period. She feels 50% less depressed with Spravto and wants to continue it.   Continues meds Adderall XR 20 mg every morning, Wellbutrin XL 450 every morning, clonidine 0.1 mg twice daily, lorazepam 1 mg every 6 hours as needed. Has started nortriptyline 75 mg nightly for about 3 weeks and DT level 176, reduced to 50 mg HS early November. Still the same sx as noted last visit.  Tolerating meds.   Compliant.  Still depressed and family notices.  Has been able to participate in family interactions.  Some post-show let down and has to do detailed work which is hard for her bc ADD.  Sleep and eating well.  Energy OK but not great.  No SI.  Not cried in a year or so.  Clearly less depressed and hopeless than before the Spravato.  09/24/22 appt noted: Received Spravato 84 mg today as scheduled.  Tolerated it well without nausea or vomiting headache or chest pain or palpitations.  Expected dissociation gradually resolved over the 2 hour observation period. She  feels 50% less depressed with Spravto and wants to continue it.   Continues meds Adderall XR 20 mg every morning, Wellbutrin XL 450 every morning, clonidine 0.1 mg twice daily, lorazepam 1 mg every 6 hours as needed. Has started nortriptyline 75 mg nightly for about 3 weeks and DT level 176, reduced to 50 mg HS early November. Still the same sx as noted last visit.  Tolerating meds.   Compliant.  Still depressed and family notices.  Has been able to participate in family interactions.  Some post-show let down and has to do detailed work which is hard for her bc ADD.  Sleep and eating well.  Energy OK but not great.  No SI.  Not cried in a year or so.  Clearly less depressed and hopeless than before the Spravato. Is not making further progress generally.  Stuck with moderate depression  10/02/22 appt noted: Received Spravato 84 mg today as scheduled.  Tolerated it well without nausea or vomiting headache or chest pain or palpitations.  Expected dissociation gradually resolved over the 2 hour observation period. She feels 50% less depressed with Spravto and wants to continue it.   Continues meds Adderall XR 20 mg every morning, Wellbutrin XL 450 every morning, clonidine 0.1 mg twice daily, lorazepam 1 mg every 6 hours as needed. Has started nortriptyline 75 mg nightly for about 3 weeks and DT level 176, reduced to 50 mg HS early November. Still the same sx as noted last visit.  Tolerating meds.   Compliant.  Still depressed and family notices.  Has been able to participate in family interactions.  Some post-show let down and has to do detailed work which is hard for her bc ADD.  Sleep and eating well.  Energy OK but not great.  No SI.  Not cried in a year or so.  Clearly less depressed and hopeless than before the Spravato. Is not making further progress generally.  Stuck with moderate depression.  Is able to function pretty normally. Plan: trial pramipexole 0.25 mg BID off label for depression.    10/08/22 appt noted: Received  Spravato 84 mg today as scheduled.  Tolerated it well without nausea or vomiting headache or chest pain or palpitations.  Expected dissociation gradually resolved over the 2 hour observation period. She feels 50% less depressed with Spravto and wants to continue it.   Continues meds Adderall XR 20 mg every morning, Wellbutrin XL 450 every morning, clonidine 0.1 mg twice daily, lorazepam 1 mg every 6 hours as needed. Has started nortriptyline 75 mg nightly for about 3 weeks and DT level 176, reduced to 50 mg HS early November. Still the same sx as noted last visit.  Tolerating meds.   Compliant.  Still depressed and family notices.  Has been able to participate in family interactions.  Some post-show let down and has to do detailed work which is hard for her bc ADD.  Sleep and eating well.  Energy OK but not great.  No SI.  Not cried in a year or so.  Clearly less depressed and hopeless than before the Spravato. Is not making further progress generally.  Stuck with moderate depression.  Behaved and felt pretty normally with family over for Thanksgiving. Doesn't see benefit or SE with pramipexole but thinks maybe it makes her worse. Plan:  increase pramipexole augmentation off label to 0.5 mg BID  10/15/2022 appointment noted: Received Spravato 84 mg today as scheduled.  Tolerated it well without nausea or vomiting headache or chest pain or palpitations.  Expected dissociation gradually resolved over the 2 hour observation period. She feels 50% less depressed with Spravto and wants to continue it.   Continues meds Adderall XR 20 mg every morning, Wellbutrin XL 450 every morning, clonidine 0.2 mg twice daily, lorazepam 1 mg every 6 hours as needed.  Trazodone 50 mg tablets 1-2 nightly as needed insomnia Has started nortriptyline 75 mg nightly for about 3 weeks and DT level 176, reduced to 50 mg HS early November. Recommended increase pramipexole 0.5 mg twice daily off  label for treatment resistant depression on 10/08/2022. She feels better motivated more active with pramipexole 0.5 mg twice daily.  She still is depressed but it is better.  We discussed the possibility of going up in the dose but did not change it.  10/22/2022 appointment noted: Received Spravato 84 mg today as scheduled.  Tolerated it well without nausea or vomiting headache or chest pain or palpitations.  Expected dissociation gradually resolved over the 2 hour observation period. She feels 50% less depressed with Spravto and wants to continue it.   Continues meds Adderall XR 20 mg every morning, Wellbutrin XL 450 every morning, clonidine 0.2 mg twice daily, lorazepam 1 mg every 8 hours as needed.  Trazodone 50 mg tablets 1-2 nightly as needed insomnia Has started nortriptyline 75 mg nightly for about 3 weeks and DT level 176, reduced to 50 mg HS early November. pramipexole 0.5 mg twice daily off label for treatment resistant depression on 10/08/2022. She is better motivated than she was.  She is journaling 3 pages a day.  She has started walking and has walked 5 days a week for 50 minutes for the last 2 weeks.  That is significantly helped her mood.  Her mood tends to be better when she interacts with family.  However she still has some periods of depression.  She is tolerating the medications well.  She still feels like her affect and confidence is not back to normal. Plan:  increase pramipexole augmentation off label to 0.5 mg tID or 0.75 mg BID   10/29/22  appt noted: Received Spravato 84 mg today as scheduled.  Tolerated it well without nausea or vomiting headache or chest pain or palpitations.  Expected dissociation gradually resolved over the 2 hour observation period. She feels 50% less depressed with Spravto and wants to continue it.   Continues meds Adderall XR 20 mg every morning, Wellbutrin XL 450 every morning, clonidine 0.2 mg twice daily, lorazepam 1 mg every 8 hours as needed.   Trazodone 50 mg tablets 1-2 nightly as needed insomnia Has started nortriptyline 75 mg nightly for about 3 weeks and DT level 176, reduced to 50 mg HS early November. pramipexole increased to 0.75 mg twice daily off label for treatment resistant depression  She has been feeling somewhat better with the increase in pramipexole and is tolerating it well.  She still is easily overwhelmed.  Her affect and mood can improve now when around her family or doing something positive.  She has been able to be more productive. She is tolerating the current medications. She is still considering TMS as an alternative to Spravato.  11/15/2022 appointment noted: Received Spravato 84 mg today as scheduled.  Tolerated it well without nausea or vomiting headache or chest pain or palpitations.  Expected dissociation gradually resolved over the 2 hour observation period. She feels 50% less depressed with Spravto and wants to continue it.   Continues meds Adderall XR 20 mg every morning, Wellbutrin XL 450 every morning, clonidine 0.2 mg twice daily, lorazepam 1 mg every 8 hours as needed.  Trazodone 50 mg tablets 1-2 nightly as needed insomnia Has started nortriptyline 75 mg nightly for about 3 weeks and DT level 176, reduced to 50 mg HS early November. pramipexole increased to 0.75 mg twice daily off label for treatment resistant depression  He has noticed some increase in depression due to the length of time since the last Spravato administration.  She believes Spravato is helping her.  sHe is not suicidal but has felt very blue the last few days. She is tolerating the medication. She is ambivalent about Spravato versus TMS but she is considering TMS. She wants to continue Spravato administration because it is clearly helpful.  11/19/22 appt noted: Received Spravato 84 mg today as scheduled.  Tolerated it well without nausea or vomiting headache or chest pain or palpitations.  Expected dissociation gradually resolved over  the 2 hour observation period. She feels 50% less depressed with Spravto and wants to continue it.   Continues meds Adderall XR 20 mg every morning, Wellbutrin XL 450 every morning, clonidine 0.2 mg twice daily, lorazepam 1 mg every 8 hours as needed.  Trazodone 50 mg tablets 1-2 nightly as needed insomnia Has started nortriptyline 75 mg nightly for about 3 weeks and DT level 176, reduced to 50 mg HS early November. pramipexole increased to 0.75 mg twice daily off label for treatment resistant depression  She has felt better back on Spravato more regularly.  However she still has residual depression esp when alone or when wihtout activity.  Can function when needed.   Does not have her connfidence back. She plans to pursue TMS availability.    ECT-MADRS    Flowsheet Row Clinical Support from 08/06/2022 in [facility] Clinical Support from 07/04/2022 in [facility] Clinical Support from 05/21/2022 in [facility] Office Visit from 03/02/2022 in [facility]  MADRS Total Score 29 15 27  46        Past Psychiatric Medication Trials: fluoxetine, duloxetine, Viibryd, lamotrigine, Pristiq,  sertraline, citalopram,  Trintellix anxious and SI Wellbutrin XL 450 [place] 1 dose  Adderall, Adderall XR, Vyvanse, Ritalin, Strattera low dose NR Lorazepam Trazodone  Depakote,  lamotrigine cog complaints Lithium remotely Abilify 7.5  Vraylar 1.5 mg daily agitation and insomnia Rexulti insomnia Latuda 40 one dose, CO anxious and SI Seroquel XR 300 Olanzapine 10  At visit November 12, 2019. We discussed Patient developed an increasingly severe alcohol dependence problem since her last visit in January.  She went to [facility] and has had no alcohol since then except 1 day.  She never abused stimulants but they took her off the stimulants at [facility].  Her ADD was markedly worse.  The Wellbutrin did not help the ADD.   D  history lamotrigine rash at 67 yo  Review of Systems:  Review of Systems  Constitutional:  Positive for fatigue.  Cardiovascular:  Negative for chest pain and palpitations.  Musculoskeletal:  Positive for back pain. Negative for arthralgias and joint swelling.       SP hip surgery October 2020  Neurological:  Negative for dizziness and tremors.  Psychiatric/Behavioral:  Positive for decreased concentration and dysphoric mood. Negative for agitation, behavioral problems, confusion, hallucinations, self-injury, sleep disturbance and suicidal ideas. The patient is nervous/anxious. The patient is not hyperactive.     Medications: I have reviewed the patient's current medications.  Current Outpatient Medications  Medication Sig Dispense Refill   amLODipine (NORVASC) 2.5 MG tablet Take 2.5 mg by mouth daily.     amphetamine-dextroamphetamine (ADDERALL XR) 20 MG 24 hr capsule Take 1 capsule (20 mg total) by mouth every morning. 30 capsule 0   amphetamine-dextroamphetamine (ADDERALL XR) 20 MG 24 hr capsule Take 1 capsule (20 mg total) by mouth every morning. 30 capsule 0   buPROPion (WELLBUTRIN XL) 150 MG 24 hr tablet TAKE 3 TABLETS BY MOUTH DAILY 270 tablet 1   cloNIDine (CATAPRES) 0.2 MG tablet Take 1 tablet (0.2 mg total) by mouth 2 (two) times daily. 60 tablet 2   Dextromethorphan HBr 15 MG CAPS Take 1 capsule (15 mg total) by mouth every 12 (twelve) hours. 60 capsule 0   Esketamine HCl, 84 MG Dose, (SPRAVATO, 84 MG DOSE,) 28 MG/DEVICE SOPK USE 3 SPRAYS IN EACH NOSTRIL ONCE A WEEK 3 each 0   iron polysaccharides (NIFEREX) 150 MG capsule TAKE 1 CAPSULE BY MOUTH EVERY DAY 90 capsule 1   LORazepam (ATIVAN) 1 MG tablet Take 1 tablet (1 mg total) by mouth every 8 (eight) hours as needed. for anxiety 90 tablet 0   losartan (COZAAR) 50 MG tablet Take 50 mg by mouth daily.     nebivolol (BYSTOLIC) 2.5 MG tablet Take 2.5 mg by mouth daily.     nortriptyline (PAMELOR) 25 MG capsule Take 2 capsules (50  mg total) by mouth at bedtime. 180 capsule 0   pramipexole (MIRAPEX) 0.5 MG tablet Take 1 tablet (0.5 mg total) by mouth 3 (three) times daily. 90 tablet 0   traZODone (DESYREL) 50 MG tablet TAKE 1-2 TABLETS BY MOUTH NIGHTLY AS NEEDED FOR SLEEP 180 tablet 1   No current facility-administered medications for this visit.    Medication Side Effects: None  Allergies:  Allergies  Allergen Reactions   Metronidazole Shortness Of Breath and Other (See Comments)    Heart pounding   Ferrlecit [Na Ferric Gluc Cplx In Sucrose] Other (See Comments)    Infusion reaction 05/12/2019    Past Medical History:  Diagnosis Date   ADHD  Anemia    Anxiety    Arthritis    Depression    Heart murmur    i went to see a cardiologit slast eyar  and i had zero plaque,    PONV (postoperative nausea and vomiting)    Recovering alcoholic in remission ([place])     Family History  Problem Relation Age of Onset   Atrial fibrillation Mother    CAD Father     Past Medical History, Surgical history, Social history, and Family history were reviewed and updated as appropriate.   Please see review of systems for further details on the patient's review from today.   Objective:   Physical Exam:  There were no vitals taken for this visit.  Physical Exam Constitutional:      General: She is not in acute distress. Neurological:     Mental Status: She is alert and oriented to person, place, and time.     Coordination: Coordination normal.     Gait: Gait normal.  Psychiatric:        Attention and Perception: Attention and perception normal.        Mood and Affect: Mood is anxious and depressed. Affect is not labile, blunt, angry or tearful.        Speech: Speech is not rapid and pressured, slurred or tangential.        Behavior: Behavior is not slowed.        Thought Content: Thought content is not paranoid or delusional. Thought content does not include homicidal or suicidal ideation. Thought content does  not include suicidal plan.        Cognition and Memory: Cognition normal. Memory is not impaired. She does not exhibit impaired recent memory.        Judgment: Judgment normal.     Comments: Insight intact. No auditory or visual hallucinations. No delusions.  Residual depression and anxiety about 60% better with Spravato. Affect better with more spontaneity  now vs before Spravato.  less blunted No Sui intent plan      Lab Review:     Component Value Date/Time   NA 137 01/12/2021 1430   NA 140 11/18/2018 1544   K 3.8 01/12/2021 1430   CL 108 01/12/2021 1430   CO2 22 01/12/2021 1430   GLUCOSE 94 01/12/2021 1430   BUN 14 01/12/2021 1430   BUN 20 11/18/2018 1544   CREATININE 0.82 01/12/2021 1430   CALCIUM 8.9 01/12/2021 1430   PROT 6.6 01/12/2021 1430   ALBUMIN 3.9 01/12/2021 1430   AST 12 (L) 01/12/2021 1430   ALT 11 01/12/2021 1430   ALKPHOS 46 01/12/2021 1430   BILITOT 0.5 01/12/2021 1430   GFRNONAA >60 01/12/2021 1430   GFRAA >60 09/02/2019 0249   GFRAA >60 01/27/2019 0811       Component Value Date/Time   WBC 4.5 01/12/2021 1430   RBC 4.32 01/12/2021 1430   HGB 12.8 01/12/2021 1430   HGB 12.9 07/17/2019 0953   HCT 38.5 01/12/2021 1430   HCT 21.9 (L) 12/25/2018 1221   PLT 272 01/12/2021 1430   PLT 286 07/17/2019 0953   MCV 89.1 01/12/2021 1430   MCH 29.6 01/12/2021 1430   MCHC 33.2 01/12/2021 1430   RDW 12.4 01/12/2021 1430   LYMPHSABS 1.4 01/12/2021 1430   MONOABS 0.4 01/12/2021 1430   EOSABS 0.0 01/12/2021 1430   BASOSABS 0.0 01/12/2021 1430    No results found for: "POCLITH", "LITHIUM"   No results found for: "PHENYTOIN", "PHENOBARB", "VALPROATE", "  CBMZ"   .res Assessment: Plan:    Recurrent major depression resistant to treatment (HCC)  Generalized anxiety disorder  Attention deficit hyperactivity disorder (ADHD), predominantly inattentive type  Insomnia due to mental condition  Accelerated hypertension   She has treatment resistant major  depression at this time.  Have  discussed some of her recent abnormal behaviors leading to this depressive episode getting worse which she says were associated with heavy use of delta 8 and not a manic episode.  She realizes now that that was not good for her.  She stopped all use of other drugs including those available over-the-counter such as delta 8 or any other THC related products.  She is no longer having any of those types of behaviors and instead is depressed.  She remains depressed with lack of interest and lack of feeling for things that normally she would have feelings about.  She has low motivation and energy.  She is difficulty making decisions.  She is sad and down.  She is less productive than usual.  Her concentration is poor.  She has high degree of anxiety as well.  Her personality is more flat than normal for her.. She has a very negative self-image and low self-esteem.  These are all uncharacteristic for her However all of these symptoms are partially improved through Spravato and the switch from Seroquel to olanzapine.  She is approximately 50% better.  Patient was administered Spravato 84 mg intranasally today.  The patient experienced the typical dissociation which gradually resolved over the 2-hour period of observation.  There were no complications.  Specifically the patient did not have nausea or vomiting or headache.  Blood pressures monitored at the 40-minute and 2-hour follow-up intervals.  Borderline high.  By the time the 2-hour observation period was met the patient was alert and oriented and able to exit without assistance.  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  See nursing note for further details.She wants to continue Spravato. We discussed discussed the side effects in detail as well as the protocol required to receive Spravato.   Failed multiple antidepressants.  Many of  them were not actual failures but intolerances and it is unclear whether some of that was more connected with anxiety than true side effects.  1 example is the [place].  In general she does not want to try anything but an antidepressant but has failed all major categories of antidepressants except TCAs and MAO inhibitors which have not been tried until now. There is a consistent pattern of thinking she is worse with medication which is not consistent with objective evidence.    Her self assessment is clouded by her depression.  Started nortriptyline 75 mg nightly.  Serum level 176.  So Reduced to 50 mg HS  Discussed side effects in detail.  Needs more time to help. Extensive discussion previously about her ambivalence about meds and missatributing sx of depression as SE of meds.    continue Wellbutrin XL 450 mg AM  Consider Off label augmentation DM at a lower dose of 15 mg BID-defer longer  Recently increase pramipexole augmentation off label to 0.5 mg tID or 0.75 mg BID  Disc SE.  Looks like partial response with this. She wants to continue it.  Started Spravato 84 mg twice weekly on 03/16/2022.  Now on weekly administration  Adderall  XR 20 mg AM   Ativan 1 mg 3 times daily as  needed anxiety but try to cut it back. Is not ideal to use benzodiazepine with stimulant but because of the severity of her symptoms it has been necessary.  Hope to eventually eliminate the benzodiazepine.  Expected as her depression improves her anxiety will improve as well.  However lately her anxiety has been unmanageable.  We will expect that to improve as the depression improves.  She has headed insturctions to reduce this.  Continue clonidine 0.2 mg BID off label for anxiety and helps BP partially. BP is better controlled but not consistent.  Consider increasing amlodipine.  Also on losartan 50  Discussed potential benefits, risks, and side effects of stimulants with patient to include increased heart rate,  palpitations, insomnia, increased anxiety, increased irritability, or decreased appetite.  Instructed patient to contact office if experiencing any significant tolerability issues. She wants to return to usual dose of Adderall for ADD bc of mor poor cognitive function with reduction.  Also discussed that depression will impair cognitive function.  TMS consultation was initially denied .  She says she called [facility] about this but they have not scheduled an appt .Marland Kitchen   Consider consultation with [facility] if not better by New year.  We discussed the pros and cons of this versus Spravato again.  For now she wants to continue Spravato because it is clearly helpful.  Extensive discussion about her chronic ambivalence about psychiatric medicines and tendency to blame medicine for the symptoms of depression that she had prior to even starting the medications.  After discussing this at length she was able to acknowledge that that is factual.  She does not appear to be having any significant side effects with the current medications.  However so far not much benefit with nortriptyline despite adequate level.  Has Maintained sobriety  No further med changes until after the holidays and consider.  FU with Spravato weekly   Meredith Staggers, MD, DFAPA     Please see After Visit Summary for patient specific instructions.  Future Appointments  Date Time Provider Department Center  11/26/2022  9:30 AM Cottle, Steva Ready., MD [facility] None  11/26/2022  9:30 AM CP-NURSE [facility] None                No orders of the defined types were placed in this encounter.      -------------------------------

## 2022-11-26 ENCOUNTER — Ambulatory Visit (INDEPENDENT_AMBULATORY_CARE_PROVIDER_SITE_OTHER): Payer: 59 | Admitting: Psychiatry

## 2022-11-26 ENCOUNTER — Ambulatory Visit: Payer: 59

## 2022-11-26 VITALS — BP 119/82 | HR 66

## 2022-11-26 DIAGNOSIS — I10 Essential (primary) hypertension: Secondary | ICD-10-CM

## 2022-11-26 DIAGNOSIS — F411 Generalized anxiety disorder: Secondary | ICD-10-CM | POA: Diagnosis not present

## 2022-11-26 DIAGNOSIS — F339 Major depressive disorder, recurrent, unspecified: Secondary | ICD-10-CM | POA: Diagnosis not present

## 2022-11-26 DIAGNOSIS — F900 Attention-deficit hyperactivity disorder, predominantly inattentive type: Secondary | ICD-10-CM

## 2022-11-26 DIAGNOSIS — F5105 Insomnia due to other mental disorder: Secondary | ICD-10-CM

## 2022-11-26 MED ORDER — LORAZEPAM 1 MG PO TABS: 1.0000 mg | ORAL_TABLET | Freq: Three times a day (TID) | ORAL | 0 refills | Status: DC | PRN

## 2022-11-26 MED ORDER — AMPHETAMINE-DEXTROAMPHET ER 20 MG PO CP24: 20.0000 mg | ORAL_CAPSULE | ORAL | 0 refills | Status: DC

## 2022-11-26 NOTE — Progress Notes (Signed)
Laura Chang [id number] September 16, 1956 67 y.o.  Subjective:   Patient ID:  Laura Chang is a 67 y.o. (DOB 05-09-1956) female.  Chief Complaint:  Chief Complaint  Patient presents with   Follow-up   Depression   Anxiety   Fatigue   ADD     HPI Laura Chang presents to the office today for follow-up of depression and anxiety and ADD.  seen November 12, 2019.  Melted down in 2020.  Went to [employer] in July.  No withdrawal.  1 drink since then.  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 She was on no stimulant and no SSRI but was taking Strattera and Wellbutrin.  The following changes were made. Stop Strattera. OK restart stimulant bc severe ADD Restart Adderall 1 daily for a few days and if tolerated then restart 1 twice daily. If not tolerated reduce the dosage if needed. May need to stop Wellbutrin if not tolerating the stimulant.  Yes.  DC Wellbutrin Restart Prozac 20 mg daily.  February 2021 appointment with the following noted: Completed grant proposal.  Couldn't doit without Adderall.  Sold a bunch of work.   Adderall XR lasts about 3 pm.  Strength seems about right.  BP been OK.  Not jittery.   Stopped Wellbutrin but had no SE. Mood drastically better with grant proposal and back on fluoxetine.  Less depressed and lethargic.  No anxiety.  Cut back on coffee. Started back with devotions and stronger faith. Plan: Continue Prozac 20 mg daily. May have to increase the dose at some point in the future given that she usually was taking higher dosages but she is getting good response at this time. Restart Wellbutrin off label for ADD since can't get 2 ADDERALL daily. 150 mg daily then 300 mg daily. She can adjust the dose between 150 mg and 300 mg daily to get the optimal effect.   05/11/2020 appointment with the following noted: Has been inconsistent with Prozac and Wellbutrin. Not sure of the effect of Wellbutrin. Biggest deterrent in work is  anxiety.  Some of the work is conceptual and difficult at times.  Can feel she's not up to a project at times.  Overall is OK but would like a steadier benefit from stimulant.  Exhausted from managing concentration and keeping up with things from the day.  Loses things.  Not good keeping up with schedule. Overall productive and emotionally OK. Can feel Adderall wear off. Mood is better in summer and worse in the winter.   F died in 2023-10-21 and that is a loss. No SE Wellbutrin. Still attends AA meetings.  Real benefit from [place] last year. Recognizes effect of anemia on ADD and mood.  Had iron infusions last winter. Plan:  Wellbutrin off label for ADD since can't get 2 ADDERALL daily. 150 mg daily then 300 mg daily.  01/24/2021 appointment with following noted: Doing a program called [employer] mindfulness app since Xmas.  CBT app helped the depression.  App helped her focus better.  Lost sign weight. Writing a lot. Before Xmas felt depressed and started negative thinking worse, self denigrating. Not drinking. More isolated.   Recognizes mo is narcissist.    Didn't tell anyone she was born until 3 mos later.  M aloof and uninterested in pt.  Lied about her birthday.  Mo lack of affection even with pt's kids. Going to [place] for a year and it helped her to quit drinking. Also misses  kids being gone with a hole also. Plan: No med changes  05/04/2021 appointment with the following noted: Therapist Paula Pile thinks she's manic. Lost weight to 144#.   States she is still sleeping okay.  Admits she is hyper and recognizes that she is likely manic.  She feels great, euphoric with an increased sense of spiritual connectedness to God.  She has racing thoughts and talks fast and talks a lot and this is noted by her husband.  He thinks she is a bit hyper.  She has been able to maintain sobriety although she will have 1 glass of wine on special occasions but does not drink by herself.  She is not  drinking to excess.  She denies any dangerous impulsivity.  She is clearly not depressed and not particularly anxious.  She has no concerns about her medication and she has been compliant.  06/16/21 appt noted: So much better.  Going through a lot but the manic thing happened on top of it.  So much slower.  Didn't feel like losing anything with risperidone.  Likes the Adderalll at 10 mg. Some drowsiness in the AM and very drowsy from risperidone 2 mg HS. Prayer life is better. Handling stress better. Less depressed with risperidone. Still likes trazodone. Sleeps well. Plan: Reduce Prozac to 10 mg daily.  Consider stopping it because it can feel the mania however she is reluctant to do that because she fears relapse of depression. Reduce risperidone to 1.5 mg nightly due to side effects.  Discussed risk of worsening mania.  07/25/2021 appointment with the following noted: Misses the Adderall and hard to function without it. Depressed now. Heavy chest.  Anxious and guilty.  Body feels heavy.   Hates Wellbutrin.   Plan: Increase fluoxetine to 20 mg daily Add Abilify 1/2 of 15 mg tablet daily Wean wellbutrin by 1 tablet each week  bc she feels it is not helpful and DT polypharmacy Reduce risperidone to 1 daily for 1 week and stop it. Disc risk of mania. Increase Adderall to XR 20 mg AM  08/08/21 Much less depressed and starting to feel normal I feel a lot better. No SE.  Speech normal off risperidone. Sleeping OK on trazaodone and enough.   Noticed benefit from Adderall again. Plan: continue fluoxetine to 20 mg daily Continue Abilify 1/2 of 15 mg tablet daily for depression and mania continue Adderall to XR 20 mg AM  10/10/2021 phone call: Pt stated she feels like the Abilify should be decreased to 5mg.She said she is depressed but rational and not suicidal.She has an appt Monday and can wait until then if you prefer. MD response: Reduce the Abilify to 7.5 mg every other day.  We will meet on  10/16/2021 and decide what to do from there.  10/16/2021 appointment with the following noted: More depressed.  Most depressed I've ever been.  Just numb.  Sense of grief.   Thinks the manic episode was unlike anything else she ever had.  Doesn't want to medicate against it.  Don't enjoy people.  Easily overwhelmed.  Had some death thoughts but not suicidal.  Has been functional.  Feels better today after reducing Abilify to every other day but she is only been doing that for 3 days. A/P: Episode of post manic depression was explained. continue fluoxetine to 20 mg daily Hold Abilify for 1 week then resume Abilify 1/2 of 15 mg tablet every other day for depression and mania continue Adderall to XR 20 mg   AM  10/27/2021 appointment with the following noted: I'm doing so much better.  Handling the depression better. Better self talk and spiritual focus has helped.   Dep 6/10 manifesting as anxiety with low confidence.   F died 2  years ago and M 67 yo and is dependent . She is working hard to feel better but still feels depressed.  She almost feels like she has a little more anxiety since restarting Abilify every other day. Plan: continue fluoxetine to 20 mg daily DC Abilify .  Vrayalar 1.5 mg QOD to try to get rid of depression ASAP. continue Adderall to XR 20 mg AM  11/10/2021 appointment with the following noted: Busy with Xmas and it was fun with family but then a big let down.  Did well with it.  Functioned well with it.  Working hard on things with depression.  Not shutting down. Not sure but feels better today but yesterday was hard.  Difficulty dealing with mother.  She won't do anything to help herself.  Yesterday with her all day.  Won't do PT and has isolated herself.    Lack of confidence.   No SE with Vraylar.  11/24/21 urgent appointment appt noted: More and more depressed.   So anxious and doesn't want to be alone but can do so. No appetite. Hurts inside. Has had some fleeting  suicidal thoughts but would not act on them.  Tolerating meds. Has been consistent with Vraylar 1.5 mg every other day, fluoxetine 20 mg daily Plan: Increase Vraylar to 1.5 mg daily Change Prozac to Trintellix 10 mg daily. Discussed side effects of each continue Adderall to XR 20 mg AM  12/27/2021 appointment with the following noted: Not OK.  I feel less depressed but feels bat shit. Not sleeping well.  Extremely anxious. Off and on sleep. 3-4 hours of sleep.   Still having daily SI.  But also become obvious has so much to do.  Overwhelmed by tasks.   Needs anxiety meds to just function. Not more motivated.  Walked yesterday.   Feels afraid like in trouble but not irritable or angry. DC DT agitation Vraylar to 1.5 mg daily Change Prozac to Trintellix 10 mg daily. Hold Adderall to XR 20 mg AM Clonidine 0.1 1/2 tablet twice daily for 2 days and if needed for anxiety and sleep increase to 1 twice daily Ok temporary Ativan 1 mg 3 times daily as needed anxiety  01/05/22 appt noted: Off fluoxetine and  Trintellix.  Only on Ativan, trazodone and Adderall XR 20 plus added clonidine 0.1 mg BID Didn't think she needed to start Trintellix. Not taking Ativan.   Didn't like herself last week. Feels some better today. Wonders if the manic sx Not agitated.  Anxiety kind of calmed down.  A lot to be anxious about situationally.  $ stress. Concerns about downers with meds. Can't access normal personality. ? Lethargy and inability to talk as sE. Plan: Latuda 20-40 mg daily with food. Adderall to XR 20 mg AM Clonidine 0.1 1/2 tablet twice daily  reduce dose to be sure no SE Ok temporary Ativan 1 mg 3 times daily as needed anxiety  01/19/22 appt noted: Taking Latuda 20 mg daily.  Took 40 mg once and felt anxious and  SI Still depressed and not very reactive Anxiety mainly about the depression and fears of the future. She wants to revisit manic sx and thinks it was maybe bc taking delta 8 bc was  taking a lot of   it so still doesn't think she's classic bipolar. She wants to only take Prozac bc thinks Latuda is perpetuating depression. Says the delta 8 was very psychaedelic.  When not taking it was not manic.  Sleeping ok again.  Plan: Per her request DC Latuda 20-40 mg daily with food. She wants to continue Prozac alone AMA  Adderall to XR 20 mg AM Clonidine 0.1 1/2 tablet twice daily  reduce dose to be sure no SE Ok temporary Ativan 1 mg 3 times daily as needed anxiety  01/23/2022 phone call complaining of increased anxiety since stopping Latuda.  She will try increasing clonidine.  01/26/2022 phone call not feeling well and wanted to restart the Vraylar.  However notes indicate that had made her agitated therefore she was encouraged to pick up samples of Rexulti 1 mg and start that instead.  02/06/2022 phone call: Stating she felt the Rexulti was helping with depression but she was not sleeping well and obsessing over things.  She was encouraged to increase Rexulti to 2 mg daily and increase trazodone for sleep.  02/09/2022 appointment with the following noted: This was an urgent work in appointment No sleep last night with trazodone 100 mg HS Nothing really better depression or anxiety. Ruminating negative anxious thoughts. Did not tolerate Rexulti because it was causing insomnia.  Does not think it helped depression.  Lacks emotion that she should have.  Lacks her usual personality.  Some hopeless thoughts.  Some death thoughts.  Some suicidal thoughts without plan or intent Plan: DC Rexulti and Prozac & DC trazodone Adderall to XR 20 mg AM Clonidine 0.1 1/tablet twice daily  reduce dose to be sure no SE Ok temporary Ativan 1 mg 3 times daily as needed anxiety Start Seroquel XR 150 mg nightly  03/02/2022 appointment: Laura Chang called back a few days after starting Seroquel stating it was making her more anxious and more depressed.  This seemed unlikely as this medicine rarely ever  causes anxiety.  She stopped the medication waited 3 days and called back still had anxiety and depression but thought perhaps the anxiety was a little better.  She did not want to take the Seroquel. She knew about the option of Spravato and wanted to pursue that. Now questions whether to return to Seroquel while waiting to start Spravato bc feels just as bad without it and knows she didn't give it enough time to work.   MADRS 46  ECT-MADRS    Flowsheet Row Clinical Support from 08/06/2022 in [facility] Clinical Support from 07/04/2022 in [facility] Clinical Support from 05/21/2022 in [facility] Office Visit from 03/02/2022 in [facility]  MADRS Total Score 29 15 27 46      03/14/22 appt noted: Pt received Spravato 56 mg first dose today with some dissociative sx which were not severe.  She was anxious prior to the administration and felt better after receiving lorazepam 1 mg.  No NV, or HA. Wants to continue Spravato. Ongoing depression and desperate to feel better.  I'm not myself DT deprsssion which is most severe in recent history.  Anhedonia.  Low motivation.  Social avoidance. Continues to think all recent med trials are making her worse.  Sleep ok with Seroquel.  03/16/22 appt noted: Received Spravato 84 mg for the first time.  some dissociative sx which were not severe.  She was anxious prior to the administration and felt better after receiving lorazepam 1 mg.  No NV, or HA. Wants   to continue Spravato.   Does not feel any better or different since the last appt.  Ongoing depression.  Ongoing depression and desperate to feel better.  I'm not myself DT deprsssion which is most severe in recent history.  Anhedonia.  Low motivation.  Social avoidance. Continues to think all recent med trials are making her worse.  Sleep ok with Seroquel.  Does not want to continue Seroquel for TRD.  03/20/2022 appointment noted: Came for  Spravato administration today.  However blood pressure was significantly elevated approximately 180/115.  She was given lorazepam 1 mg and clonidine 0.2 mg to try to get it down. She states she regretted stopping the Seroquel XR 300 mg tablets.  She now realizes it was helpful.  She did not sleep much at all last night.  She did not take the Adderall this morning. 2 to 3 hours after arrival blood pressure was still elevated at  170/110, 62 pulse.  For Spravato administration was canceled for today.  She admits to being anxious and depressed.  She is not suicidal.  She is highly motivated to receive the Spravato.  We discussed getting it tomorrow.  03/22/2022 appointment noted: Patient's blood pressure was never stable enough yesterday in order to get her in for Spravato administration.  She was encouraged to see her primary care doctor.  It is better today.  03/26/2022 appointment with the following noted: Blood pressure was better.  Saw her primary care doctor who started on oral Bystolic 2.5 mg daily. Received Spravato 84 mg today as scheduled.  Tolerated it well without nausea or vomiting headache or chest pain or palpitations.  Her blood pressure was borderline but manageable. She remains depressed and anxious.  She is ambivalent about the medicine and desperate to get to feel better.  Continues to have anhedonia and low energy and low motivation and reduced ability to do things.  Less social.  Not suicidal.  03/28/22 appt noted: Received Spravato 84 mg today as scheduled.  Tolerated it well without nausea or vomiting headache or chest pain or palpitations.  Her blood pressure was borderline but manageable. Has not seen any improvement so far.  Tolerating Seroquel.  Inconsistent with Bystolic and BP has been borderline high. Still depressed and anxious and anhedonia.  Low motivation, energy, productivity. Taking quetiapine and tolerating XR 300 mg nightly.  04/04/22 appt noted: Received Spravato  84 mg today as scheduled.  Tolerated it well without nausea or vomiting headache or chest pain or palpitations.  Her blood pressure was borderline but manageable. Has not seen any improvement so far.  Tolerating Seroquel.   She still tends to think that the medications are making her worse.  She has said this about each of the recent psychiatric medicines including Seroquel.  However her husband thinks she is improved.  She also admits there is some improvement in productivity.  She still feels highly anxious.  She still does not enjoy things as normal.  She still feels desperate to improve as soon as possible. Has been taking Seroquel XR since 03/20/2022  04/10/22 appt noted: Received Spravato 84 mg today as scheduled.  Tolerated it well without nausea or vomiting headache or chest pain or palpitations.  Her blood pressure was borderline but manageable. Has not seen any improvement so far.  Tolerating Seroquel.  Doesn't like Seroquel bc she thinks it flattens here. Ongoing depression without confidence Plan: Start Auvelity 1 every morning for persistent treatment resistant depression  04/12/2022 appointment with the following noted:  Received Spravato 84 mg today as scheduled.  Tolerated it well without nausea or vomiting headache or chest pain or palpitations.  Her blood pressure was borderline but manageable. Has not seen any improvement so far.  Tolerating Seroquel.  Doesn't like Seroquel bc she thinks it flattens her. Received Spravato 84 mg today as scheduled.  Tolerated it well without nausea or vomiting headache or chest pain or palpitations.  Her blood pressure was borderline but manageable. Has not seen any improvement so far.  Tolerating Seroquel.  Doesn't like Seroquel bc she thinks it flattens here.  We discussed her ambivalence about it. She is starting Auvelity and has tolerated it the last 2 days without side effect.  She still does not feel like herself and feels flat and not enjoying  things with suppressed expressed emotion  04/17/2022 appointment with the following noted: Received Spravato 84 mg today as scheduled.  Tolerated it well without nausea or vomiting headache or chest pain or palpitations.  Her blood pressure was borderline but manageable. Has not seen any improvement so far.  Tolerating Seroquel.  Doesn't like Seroquel bc she thinks it flattens her. She has been tolerating the Auvelity 1 in the morning without side effects for about a week.  She has not noticed significant improvement so far.  She still feels depressed and flat and not herself.  Other people notice that she is flat emotionally.  She is not suicidal.  She does feel discouraged that she is not getting better yet.  04/19/2022 appointment noted: Has increased Auvelity to 1 twice daily for 2 days, continues quetiapine XR 300 mg nightly, clonidine 0.3 mg twice daily, lorazepam 1 mg twice daily for anxiety and Adderall XR 20 mg in the morning. No obious SE but she still thinks quetiapine XR is making her feel down.  But not sedated Received Spravato 84 mg today as scheduled.  Tolerated it well without nausea or vomiting headache or chest pain or palpitations.  Her blood pressure was borderline but manageable. She still feels quite anxious and feels it necessary to take both the clonidine and lorazepam twice a day to manage her anxiety.  She has been consistently down and flat and not herself until yesterday afternoon she noted an improvement in mood and feeling much more like herself with her normal personality reemerging.  She was quite depressed in the morning with very dark negative thoughts.  She did not have those dark negative thoughts this morning.  She had a lot of questions about medication and when she was expecting to be improved and why she has not shown improvement up to now.  04/23/22 appt noted: Has increased Auvelity to 1 twice daily for 1 week, continues quetiapine XR 300 mg nightly, clonidine 0.3  mg twice daily, lorazepam 1 mg twice daily for anxiety and Adderall XR 20 mg in the morning. No obious SE but she still thinks quetiapine XR is making her feel down.  But not sedated Received Spravato 84 mg today as scheduled.  Tolerated it well without nausea or vomiting headache or chest pain or palpitations.  She is still depressed but admits better function and is able to enjoy social interactions. Tolerating meds.  Would like to feel better for sure. Not herself.  Flat. Plan increase Auvelity to 1 tab BID as planned and reduce Quetiapine to 1/2 of ER 300 mg  bc NR for depression.  04/25/2022 appointment with the following noted: clonidine 0.3 mg twice daily, lorazepam 1 mg twice daily   for anxiety and Adderall XR 20 mg in the morning. Seroquel XR 300 HS No obious SE but she still thinks quetiapine XR is making her feel down.  But not sedated Received Spravato 84 mg today as scheduled.  Tolerated it well without nausea or vomiting headache or chest pain or palpitations.  Called yesterday with more anxiety.  Had increased Auvelity for 1 day and reduced Seroquel XR for 1 day.  Felt restless and fearful  05/01/2022 appointment noted: clonidine 0.3 mg twice daily, lorazepam 1 mg twice daily for anxiety and Adderall XR 20 mg in the morning. Seroquel XR 150 HS, Auvelity 1 BID Received Spravato 84 mg today as scheduled.  Tolerated it well without nausea or vomiting headache or chest pain or palpitations.  Nurse has noted patient has called multiple times sometimes asking the same question repeatedly.  It is unclear whether she is truly forgetful or is just anxious seeking reassurance. Patient acknowledges ongoing depression as well as some anxiety but states she has felt a little better in the last couple of days.  She has reduced the Seroquel to 150 mg at night and has increased Auvelity to 1 twice daily but only for 1 day.  So far she seems to be tolerating it.  05/03/22 appt noted: clonidine 0.2 mg  twice daily, lorazepam 1 mg twice daily for anxiety and Adderall XR 20 mg in the morning. Seroquel XR 150 HS, Auvelity 1 BID BP high this am about 170/100 and received extra clonidine 0.2 mg and came to receive Spravato.  Not dizzy, no SOB, nor CP but BP is still high Could not receive Spravato today bc BP high and pulse low at 30 ppm. Still depressed and anxious. Plan: continue trial Auvelity with Spravato She needs to get BP and pulse managed  05/08/22 TC: RTC  H Michael NA and mailbox full.  Could not leave message.  Pt  -  talked to she and H on speaker. H worried over wife.  Vacant stare.  Slurs words at times.  Not smiling. Reduced enjoyment.  Depression.  Withdrawn from usual activities.  Some irritability.  Anxious. Disc her concerns meds are making her worse.  Extensive discussion about her treatment resistant status.  There is a consistent pattern of not taking the medicines long enough to get benefit because she believes the meds are making her worse.  However the symptoms she describes as side effects are exactly the same symptoms that she had prior to taking the medication RX for  the depression.  So it is not clear that these are actual side effects. This is true about the 2 most recent meds including Seroquel and Auvelity.  Recommend psychiatric consultation in hopes of improving her comfort level with taking prescribed medications for a sufficient length of time to provide benefit. Extensive discussion about ECT is the treatment of choice for treatment resistant depression.  Spravato may work if she can comply with consistency.  There are medication options but they take longer to work.   Plan:  Reduce clonidine to 0.1 mg BID DT bradycardia.  Talk with PCP about BP and low pulse problems which are interfering with her consistent compliance with Spravato.   Limit lorazepam to 3 -4  mg daily max. Excess use is the cause of slurring speech.  She must stop excess use or will have to stop  the med. Stop Auvelity per her request.  But she has only been on the full dose for a little over  a week and clearly has not had time to get benefit from it.  She thinks maybe it is making her more anxious. Reduce Seroquel from 150XR to 50 -100 mg at night IR.  She couldn't sleep when stopped it completely. Will not start new antidepressant until her SE issues are resolved or not. Get second psych opinion from Chris Aiken MD or another psychiatrist.  H's sister is therapist in [place] Kenadi Miltner Cottle, MD, DFAPA  05/16/2022 appointment with the following noted: Received Spravato 84 mg today as scheduled.  Tolerated it well without nausea or vomiting headache or chest pain or palpitations.  She stopped Auvelity as discussed last week. On her own, without physician input, she restarted Wellbutrin XL 450 mg every morning today.  She had taken it in the past.  She feels jittery and anxious. She feels less depressed than she did last week.  But she is still depressed without her usual range of affect.  She still is less social and less motivated than normal. Her primary care doctor increased the dose of losartan Plan: Stop Seroquel Reduce Wellbutrin XL to 300 mg every morning.  Starting the dose at 450 every morning is likely causing side effects of jitteriness and it should not be started at that have a dosage. Recommend she not change meds on her own without MDM put  05/23/2022 appointment with the following noted: Received Spravato 84 mg today as scheduled.  Tolerated it well without nausea or vomiting headache or chest pain or palpitations.  Has not dropped seroquel XR 300 mg 1/2 tablet nightly bc couldn't sleep without it. Has not tried lower dose quetiapine 50 mg HS Still feels depressed.   BP is better managed so far, just saw PCP.  BP is better today and infact is low today. Dropped clonidine as directed from 0.3 mg BID bc inadequate control of BP to 0.2 mg BID.  However she wants to increase it  back to 0.3 mg twice daily because she feels it helped her anxiety better.  Wonders about increasing Wellbutrin for depression.  However she has only been on 300 mg a day for a week.  She was on 450 mg daily in the past.  06/06/22 appt noted: Received Spravato 84 mg today as scheduled.  Tolerated it well without nausea or vomiting headache or chest pain or palpitations.  She is still depressed and anxious.  She wants to try to stop the Seroquel but cannot sleep without some of it.  She is taking lorazepam 1 mg 4 times daily and still having a lot of anxiety.  She wants to increase clonidine back to 0.3 mg twice daily.  She hopes for more improvement She recently went for a second psychiatric opinion as suggested the results of that are pending.  06/11/22 appt noted: Received Spravato 84 mg today as scheduled.  Tolerated it well without nausea or vomiting headache or chest pain or palpitations.  She is still depressed and anxious. Without much change.  Still hopeless, anhedonia, reduced inteterest and motivation.  Tolerating meds. Disc concerns Spravato is not hleping much. Plan: stop Seroquel and start olanzapine 10 mg HS for TRD and anxiety.  06/13/2022 appointment noted: Received Spravato 84 mg today as scheduled.  Tolerated it well without nausea or vomiting headache or chest pain or palpitations.  She is still depressed and anxious. Without much change.  Still hopeless, anhedonia, reduced inteterest and motivation.  Tolerating meds. Disc concerns Spravato is not helping much as hoped but is   improving a bit in the last week. Tolerating meds. Continues Wellbutrin XL 450 AM, tolerating recently started olanzapine  10 mg HS. Sleep is good.   Pending appt with TMS consult.  06/18/22 appt noted: Received Spravato 84 mg today as scheduled.  Tolerated it well without nausea or vomiting headache or chest pain or palpitations.  Tolerating meds. Continues Wellbutrin XL 450 AM, tolerating recently started  olanzapine  10 mg HS. Continues Adderall XR 20 amd and has tried to reduce lorazepam to 1mg TID Sleep is good.   Pending appt with TMS consult. Depression is a little bit better in the last week with a little improvement in emotional expression and interest.  She is pushing herself to be more active.  Her daughter thought she was a little better than she has been.  However she is still depressed and still not her normal self with anhedonia and reduced emotional expressiveness.  06/20/22 appt noted: Received Spravato 84 mg today as scheduled.  Tolerated it well without nausea or vomiting headache or chest pain or palpitations.  Tolerating meds with a little sleepiness. Continues Wellbutrin XL 450 AM, tolerating recently started olanzapine  10 mg HS. Continues Adderall XR 20 amd and has tried to reduce lorazepam to 1mg TID Sleep is good.   Mood is improving.  Better funciton.  Anxiety is better with olanzapine. Still not herself and depression not gone with some anhedonia and social avoidance and feeling overwhelmed.  8/14 2023 received Spravato 84 mg 06/27/2022 received Spravato Spravato 84 mg 07/02/2022 received Spravato 84 mg 07/04/2022 received Spravato 84 mg  07/09/2022 appointment noted: Received Spravato 84 mg today as scheduled.  Tolerated it well without nausea or vomiting headache or chest pain or palpitations.  Expected dissociation and feels less depressed with resolution of negative emotions immediately after Spravato and then depression, anxiety creep back in. Continues meds Adderall XR 20 mg every morning, Wellbutrin XL 450 every morning, clonidine 0.1 mg twice daily, lorazepam 1 mg every 6 hours as needed, olanzapine increased from 7.5 to 10 mg nightly on  Tolerating meds.  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  She does note some increased appetite and is  somewhat concerned about that but has not gained significant amounts of weight. She has had the TMS consultation which was initially denied but she knows it can be appealed.  However because she is improving with Spravato plus the other medications now she wants to continue the current treatment plan.  07/18/22 appt noted: Continues meds Adderall XR 20 mg every morning, Wellbutrin XL 450 every morning, clonidine 0.1 mg twice daily, lorazepam 1 mg every 6 hours as needed, olanzapine increased from 7.5 to 10 mg nightly on 07/04/2022. Received Spravato 84 mg today as scheduled.  Tolerated it well without nausea or vomiting headache or chest pain or palpitations.  Expected dissociation and feels less depressed with resolution of negative emotions immediately after Spravato and then depression, anxiety creep back in. Continues meds Adderall XR 20 mg every morning, Wellbutrin XL 450 every morning, clonidine 0.1 mg twice daily, lorazepam 1 mg every 6 hours as needed, olanzapine increased from 7.5 to 10 mg nightly on  Tolerating meds.  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  She does note some increased appetite and   is somewhat concerned about that but has not gained significant amounts of weight. She has had the [place] consultation which was initially denied but she knows it can be appealed. She continues to have chronic ambivalence about psychiatric medicines and initially tends to blame her depressive symptoms such as decreased concentration and feeling flat on what ever medicine she currently is taking even though she had the same symptoms before the current medicines were started.  Then after discussion she does admit that her depressive symptoms are improved since adding olanzapine but still has those residual symptoms noted.  07/23/22 received Spravato 84 mg   07/30/2022 appointment  noted: Received Spravato 84 mg today as scheduled.  Tolerated it well without nausea or vomiting headache or chest pain or palpitations.  Expected dissociation and feels less depressed with resolution of negative emotions immediately after Spravato and then depression, anxiety creep back in. Continues meds Adderall XR 20 mg every morning, Wellbutrin XL 450 every morning, clonidine 0.1 mg twice daily, lorazepam 1 mg every 6 hours as needed, olanzapine increased from 7.5 to 10 mg nightly on  She has been inconsistent with olanzapine because she continues to be ambivalent about the medications in general and thinks that perhaps the 10 mg is making her feel blunted.  She continues to feel some depression.  She had a good day this week and but still feels somewhat depressed and persistently anxious. Plan: be consistent with olanzapine 10 mg HS for TRD and longer trial for potential benefit for anxiety.  Has not taken it consistently.  08/06/2022 appointment noted: Received Spravato 84 mg today as scheduled.  Tolerated it well without nausea or vomiting headache or chest pain or palpitations.  Expected dissociation and feels less depressed with resolution of negative emotions immediately after Spravato and then depression, anxiety creep back in. Continues meds Adderall XR 20 mg every morning, Wellbutrin XL 450 every morning, clonidine 0.1 mg twice daily, lorazepam 1 mg every 6 hours as needed, olanzapine i 10 mg nightly  She continues to feel depressed but is about 50% better with Spravato.  She is still not herself.  She still has anhedonia.  She still is not her able to engage socially in the typical ways.  She is not jovial and outgoing like normal.  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  She continues to feel that her personality is dampened down.  There is a question about whether it is  related to depression or medication. Plan: continue olanzapine 10 for longer trial for TRD and severe anxiety.  08/13/22 appt noted:  Received Spravato 84 mg today as scheduled.  Tolerated it well without nausea or vomiting headache or chest pain or palpitations.  Expected dissociation and feels less depressed with resolution of negative emotions immediately after Spravato and then depression, anxiety creep back in. Continues meds Adderall XR 20 mg every morning, Wellbutrin XL 450 every morning, clonidine 0.1 mg twice daily, lorazepam 1 mg every 6 hours as needed, olanzapine i 10 mg nightly  She still does not feel herself.  Still struggles with depression and low motivation and reduced social engagement and reduced interest and reduced emotional expression.  She is somewhat better with the medicines plus Spravato.  She still believes the Spravato makes her blunted and is not sure how much it helps her anxiety.  She can have good days when  her family is around and she is engaged.  She still wants to stop the olanzapine. She has apparently continued to take the trazodone despite having been told to stop it when she started olanzapine.  She feels like she needs the trazodone. Plan: DC olanzapine and Start nortriptyline 25 mg nightly and build up to 75 mg nightly and then check blood level.    08/27/2022 appointment noted: Received Spravato 84 mg today as scheduled.  Tolerated it well without nausea or vomiting headache or chest pain or palpitations.  Expected dissociation and feels less depressed with resolution of negative emotions immediately after Spravato and then depression, anxiety creep back in. Continues meds Adderall XR 20 mg every morning, Wellbutrin XL 450 every morning, clonidine 0.1 mg twice daily, lorazepam 1 mg every 6 hours as needed. Stopped olanzapine and started nortriptyline which she has taken for about a week is 75 mg nightly. So far she is tolerating the nortriptyline well with the  exception of some dry mouth and constipation which she is working to manage.  She does not feel substantially better better or different off the olanzapine.  No change in her sleep which is good.  Main concern currently in addition to the residual depression is anxiety which is somewhat situational with pending arch show.  She is worrying about it more than normal.  Says she is having to take lorazepam twice a day where she had been able to keep reduce it prior to this.  She still does not feel like herself with residual depression with less social interest and less of her usual buoyancy in personality.  She is flatter than normal.  Overall she still feels that the Spravato has been helpful at reducing the severity of the depression.  She is not suicidal. She has not heard anything about the TMS appeal as of yet.  09/05/2022 appointment noted: Received Spravato 84 mg today as scheduled.  Tolerated it well without nausea or vomiting headache or chest pain or palpitations.  Expected dissociation and feels less depressed with resolution of negative emotions immediately after Spravato and then depression, anxiety creep back in. Continues meds Adderall XR 20 mg every morning, Wellbutrin XL 450 every morning, clonidine 0.1 mg twice daily, lorazepam 1 mg every 6 hours as needed. Stopped olanzapine and started nortriptyline which she has taken for about 2 week is 75 mg nightly. Initially blood pressure was a little high causing delay in starting Spravato.  She admitted to feeling a little wound up.  She still experiences a little increase in depression if she goes longer than a week in between doses of Spravato.  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  09/10/22 appt noted: Received Spravato 84 mg today as scheduled.  Tolerated it well without nausea or vomiting headache or chest pain or palpitations.   Expected dissociation gradually resolved over the 2 hour observation period. She feels 50% less depressed with Spravto and wants to continue it.   Continues meds Adderall XR 20 mg every morning, Wellbutrin XL 450 every morning, clonidine 0.1 mg twice daily, lorazepam 1 mg every 6 hours as needed. Has started nortriptyline 75 mg nightly for about 3 weeks. Has not seen a significant difference with the addition of nortriptyline.  Tolerating it pretty well. She continues to have some degree of anhedonia and significant depression and anxiety.  Her daughters noticed that   she is more needy and calls more frequently.  She acknowledges this as well.  She is clearly still not herself. Plan: pramipexole off label and RX 0.25 mg BID  09/17/2022 appointment noted: Received Spravato 84 mg today as scheduled.  Tolerated it well without nausea or vomiting headache or chest pain or palpitations.  Expected dissociation gradually resolved over the 2 hour observation period. She feels 50% less depressed with Spravto and wants to continue it.   Continues meds Adderall XR 20 mg every morning, Wellbutrin XL 450 every morning, clonidine 0.1 mg twice daily, lorazepam 1 mg every 6 hours as needed. Has started nortriptyline 75 mg nightly for about 3 weeks and DT level 176, reduced to 50 mg HS early November. Still the same sx as noted last visit.  Tolerating meds.   Compliant.  Still depressed and family notices.  Has been able to participate in family interactions.  Some post-show let down and has to do detailed work which is hard for her bc ADD.  Sleep and eating well.  Energy OK but not great.  No SI.  Not cried in a year or so.  Clearly less depressed and hopeless than before the Spravato.  09/24/22 appt noted: Received Spravato 84 mg today as scheduled.  Tolerated it well without nausea or vomiting headache or chest pain or palpitations.  Expected dissociation gradually resolved over the 2 hour observation period. She  feels 50% less depressed with Spravto and wants to continue it.   Continues meds Adderall XR 20 mg every morning, Wellbutrin XL 450 every morning, clonidine 0.1 mg twice daily, lorazepam 1 mg every 6 hours as needed. Has started nortriptyline 75 mg nightly for about 3 weeks and DT level 176, reduced to 50 mg HS early November. Still the same sx as noted last visit.  Tolerating meds.   Compliant.  Still depressed and family notices.  Has been able to participate in family interactions.  Some post-show let down and has to do detailed work which is hard for her bc ADD.  Sleep and eating well.  Energy OK but not great.  No SI.  Not cried in a year or so.  Clearly less depressed and hopeless than before the Spravato. Is not making further progress generally.  Stuck with moderate depression  10/02/22 appt noted: Received Spravato 84 mg today as scheduled.  Tolerated it well without nausea or vomiting headache or chest pain or palpitations.  Expected dissociation gradually resolved over the 2 hour observation period. She feels 50% less depressed with Spravto and wants to continue it.   Continues meds Adderall XR 20 mg every morning, Wellbutrin XL 450 every morning, clonidine 0.1 mg twice daily, lorazepam 1 mg every 6 hours as needed. Has started nortriptyline 75 mg nightly for about 3 weeks and DT level 176, reduced to 50 mg HS early November. Still the same sx as noted last visit.  Tolerating meds.   Compliant.  Still depressed and family notices.  Has been able to participate in family interactions.  Some post-show let down and has to do detailed work which is hard for her bc ADD.  Sleep and eating well.  Energy OK but not great.  No SI.  Not cried in a year or so.  Clearly less depressed and hopeless than before the Spravato. Is not making further progress generally.  Stuck with moderate depression.  Is able to function pretty normally. Plan: trial pramipexole 0.25 mg BID off label for depression.      10/08/22 appt noted: Received Spravato 84 mg today as scheduled.  Tolerated it well without nausea or vomiting headache or chest pain or palpitations.  Expected dissociation gradually resolved over the 2 hour observation period. She feels 50% less depressed with Spravto and wants to continue it.   Continues meds Adderall XR 20 mg every morning, Wellbutrin XL 450 every morning, clonidine 0.1 mg twice daily, lorazepam 1 mg every 6 hours as needed. Has started nortriptyline 75 mg nightly for about 3 weeks and DT level 176, reduced to 50 mg HS early November. Still the same sx as noted last visit.  Tolerating meds.   Compliant.  Still depressed and family notices.  Has been able to participate in family interactions.  Some post-show let down and has to do detailed work which is hard for her bc ADD.  Sleep and eating well.  Energy OK but not great.  No SI.  Not cried in a year or so.  Clearly less depressed and hopeless than before the Spravato. Is not making further progress generally.  Stuck with moderate depression.  Behaved and felt pretty normally with family over for Thanksgiving. Doesn't see benefit or SE with pramipexole but thinks maybe it makes her worse. Plan:  increase pramipexole augmentation off label to 0.5 mg BID  10/15/2022 appointment noted: Received Spravato 84 mg today as scheduled.  Tolerated it well without nausea or vomiting headache or chest pain or palpitations.  Expected dissociation gradually resolved over the 2 hour observation period. She feels 50% less depressed with Spravto and wants to continue it.   Continues meds Adderall XR 20 mg every morning, Wellbutrin XL 450 every morning, clonidine 0.2 mg twice daily, lorazepam 1 mg every 6 hours as needed.  Trazodone 50 mg tablets 1-2 nightly as needed insomnia Has started nortriptyline 75 mg nightly for about 3 weeks and DT level 176, reduced to 50 mg HS early November. Recommended increase pramipexole 0.5 mg twice daily off  label for treatment resistant depression on 10/08/2022. She feels better motivated more active with pramipexole 0.5 mg twice daily.  She still is depressed but it is better.  We discussed the possibility of going up in the dose but did not change it.  10/22/2022 appointment noted: Received Spravato 84 mg today as scheduled.  Tolerated it well without nausea or vomiting headache or chest pain or palpitations.  Expected dissociation gradually resolved over the 2 hour observation period. She feels 50% less depressed with Spravto and wants to continue it.   Continues meds Adderall XR 20 mg every morning, Wellbutrin XL 450 every morning, clonidine 0.2 mg twice daily, lorazepam 1 mg every 8 hours as needed.  Trazodone 50 mg tablets 1-2 nightly as needed insomnia Has started nortriptyline 75 mg nightly for about 3 weeks and DT level 176, reduced to 50 mg HS early November. pramipexole 0.5 mg twice daily off label for treatment resistant depression on 10/08/2022. She is better motivated than she was.  She is journaling 3 pages a day.  She has started walking and has walked 5 days a week for 50 minutes for the last 2 weeks.  That is significantly helped her mood.  Her mood tends to be better when she interacts with family.  However she still has some periods of depression.  She is tolerating the medications well.  She still feels like her affect and confidence is not back to normal. Plan:  increase pramipexole augmentation off label to 0.5 mg tID or 0.75 mg  BID   10/29/22 appt noted: Received Spravato 84 mg today as scheduled.  Tolerated it well without nausea or vomiting headache or chest pain or palpitations.  Expected dissociation gradually resolved over the 2 hour observation period. She feels 50% less depressed with Spravto and wants to continue it.   Continues meds Adderall XR 20 mg every morning, Wellbutrin XL 450 every morning, clonidine 0.2 mg twice daily, lorazepam 1 mg every 8 hours as needed.   Trazodone 50 mg tablets 1-2 nightly as needed insomnia Has started nortriptyline 75 mg nightly for about 3 weeks and DT level 176, reduced to 50 mg HS early November. pramipexole increased to 0.75 mg twice daily off label for treatment resistant depression  She has been feeling somewhat better with the increase in pramipexole and is tolerating it well.  She still is easily overwhelmed.  Her affect and mood can improve now when around her family or doing something positive.  She has been able to be more productive. She is tolerating the current medications. She is still considering TMS as an alternative to Spravato.  11/15/2022 appointment noted: Received Spravato 84 mg today as scheduled.  Tolerated it well without nausea or vomiting headache or chest pain or palpitations.  Expected dissociation gradually resolved over the 2 hour observation period. She feels 50% less depressed with Spravto and wants to continue it.   Continues meds Adderall XR 20 mg every morning, Wellbutrin XL 450 every morning, clonidine 0.2 mg twice daily, lorazepam 1 mg every 8 hours as needed.  Trazodone 50 mg tablets 1-2 nightly as needed insomnia Has started nortriptyline 75 mg nightly for about 3 weeks and DT level 176, reduced to 50 mg HS early November. pramipexole increased to 0.75 mg twice daily off label for treatment resistant depression  He has noticed some increase in depression due to the length of time since the last Spravato administration.  She believes Spravato is helping her.  sHe is not suicidal but has felt very blue the last few days. She is tolerating the medication. She is ambivalent about Spravato versus [place] but she is considering [place]. She wants to continue Spravato administration because it is clearly helpful.  11/19/22 appt noted: Received Spravato 84 mg today as scheduled.  Tolerated it well without nausea or vomiting headache or chest pain or palpitations.  Expected dissociation gradually resolved over  the 2 hour observation period. She feels 50% less depressed with Spravto and wants to continue it.   Continues meds Adderall XR 20 mg every morning, Wellbutrin XL 450 every morning, clonidine 0.2 mg twice daily, lorazepam 1 mg every 8 hours as needed.  Trazodone 50 mg tablets 1-2 nightly as needed insomnia Has started nortriptyline 75 mg nightly for about 3 weeks and DT level 176, reduced to 50 mg HS early November. pramipexole increased to 0.75 mg twice daily off label for treatment resistant depression  She has felt better back on Spravato more regularly.  However she still has residual depression esp when alone or when wihtout activity.  Can function when needed.   Does not have her connfidence back. She plans to pursue [facility] availability. Have discussed retrying [facility]  11/28/22 appt noted: Received Spravato 84 mg today as scheduled.  Tolerated it well without nausea or vomiting headache or chest pain or palpitations.  Expected dissociation gradually resolved over the 2 hour observation period. She feels 50% less depressed with Spravto and wants to continue it.   Continues meds Adderall XR 20 mg every  morning, Wellbutrin XL 450 every morning, clonidine 0.2 mg twice daily, lorazepam 1 mg every 8 hours as needed.  Trazodone 50 mg tablets 1-2 nightly as needed insomnia Has started nortriptyline 75 mg nightly for about 3 weeks and DT level 176, reduced to 50 mg HS early November. pramipexole increased to 0.75 mg twice daily off label for treatment resistant depression  Last week she was more depressed than usual for reasons that are not clear.  She has not had a clear answer from Adrienne Mocha about [facility] options.  States that they have not returned her call.  She is asked about Auvelity retry in place of Wellbutrin.  Has still been walking.   ECT-MADRS    Flowsheet Row Clinical Support from 08/06/2022 in [facility] from 07/04/2022 in [facility] from 05/21/2022 in [facility] Visit from 03/02/2022 in [facility]  MADRS Total Score 29 15 27  46        Past Psychiatric Medication Trials: fluoxetine, duloxetine, Viibryd, lamotrigine, Pristiq, sertraline, citalopram,  Trintellix anxious and SI Wellbutrin XL 450 Auvelity 1 dose  Adderall, Adderall XR, Vyvanse, Ritalin, Strattera low dose NR Lorazepam Trazodone  Depakote,  lamotrigine cog complaints Lithium remotely Abilify 7.5  Vraylar 1.5 mg daily agitation and insomnia Rexulti insomnia Latuda 40 one dose, CO anxious and SI Seroquel XR 300 Olanzapine 10  At visit November 12, 2019. We discussed Patient developed an increasingly severe alcohol dependence problem since her last visit in January.  She went to [facility] and has had no alcohol since then except 1 day.  She never abused stimulants but they took her off the stimulants at [facility].  Her ADD was markedly worse.  The Wellbutrin did not help the ADD.   D history lamotrigine rash at 67 yo  Review of Systems:  Review of Systems  Constitutional:  Positive for fatigue.  Cardiovascular:  Negative for palpitations.  Musculoskeletal:  Positive for back pain. Negative for arthralgias and joint swelling.       SP hip surgery October 2020  Neurological:  Negative for dizziness and tremors.  Psychiatric/Behavioral:  Positive for decreased concentration and dysphoric mood. Negative for agitation, behavioral problems, confusion, hallucinations, self-injury, sleep disturbance and suicidal ideas. The patient is nervous/anxious. The patient is not hyperactive.     Medications: I have reviewed the patient's current medications.  Current Outpatient Medications  Medication Sig Dispense Refill   amLODipine (NORVASC) 2.5 MG tablet Take 2.5 mg by mouth daily.     amphetamine-dextroamphetamine (ADDERALL XR) 20 MG 24 hr capsule Take 1 capsule (20 mg total) by mouth  every morning. 30 capsule 0   amphetamine-dextroamphetamine (ADDERALL XR) 20 MG 24 hr capsule Take 1 capsule (20 mg total) by mouth every morning. 30 capsule 0   buPROPion (WELLBUTRIN XL) 150 MG 24 hr tablet TAKE 3 TABLETS BY MOUTH DAILY 270 tablet 1   cloNIDine (CATAPRES) 0.2 MG tablet Take 1 tablet (0.2 mg total) by mouth 2 (two) times daily. 60 tablet 2   Esketamine HCl, 84 MG Dose, (SPRAVATO, 84 MG DOSE,) 28 MG/DEVICE SOPK USE 3 SPRAYS IN EACH NOSTRIL ONCE A WEEK 3 each 0   iron polysaccharides (NIFEREX) 150 MG capsule TAKE 1 CAPSULE BY MOUTH EVERY DAY 90 capsule 1   LORazepam (ATIVAN) 1 MG tablet Take 1 tablet (1 mg total) by mouth every 8 (eight) hours as needed. for anxiety 90 tablet 0   losartan (COZAAR) 50 MG  tablet Take 50 mg by mouth daily.     nebivolol (BYSTOLIC) 2.5 MG tablet Take 2.5 mg by mouth daily.     nortriptyline (PAMELOR) 25 MG capsule Take 2 capsules (50 mg total) by mouth at bedtime. 180 capsule 0   pramipexole (MIRAPEX) 0.5 MG tablet Take 1 tablet (0.5 mg total) by mouth 3 (three) times daily. 90 tablet 0   traZODone (DESYREL) 50 MG tablet TAKE 1-2 TABLETS BY MOUTH NIGHTLY AS NEEDED FOR SLEEP 180 tablet 1   No current facility-administered medications for this visit.    Medication Side Effects: None  Allergies:  Allergies  Allergen Reactions   Metronidazole Shortness Of Breath and Other (See Comments)    Heart pounding   Ferrlecit [Na Ferric Gluc Cplx In Sucrose] Other (See Comments)    Infusion reaction 05/12/2019    Past Medical History:  Diagnosis Date   ADHD    Anemia    Anxiety    Arthritis    Depression    Heart murmur    i went to see a cardiologit slast eyar  and i had zero plaque,    PONV (postoperative nausea and vomiting)    Recovering alcoholic in remission (HCC)     Family History  Problem Relation Age of Onset   Atrial fibrillation Mother    CAD Father     Past Medical History, Surgical history, Social history, and Family history  were reviewed and updated as appropriate.   Please see review of systems for further details on the patient's review from today.   Objective:   Physical Exam:  There were no vitals taken for this visit.  Physical Exam Constitutional:      General: She is not in acute distress. Neurological:     Mental Status: She is alert and oriented to person, place, and time.     Coordination: Coordination normal.     Gait: Gait normal.  Psychiatric:        Attention and Perception: Attention and perception normal.        Mood and Affect: Mood is anxious and depressed. Affect is not labile, blunt or tearful.        Speech: Speech is not rapid and pressured, slurred or tangential.        Behavior: Behavior is not slowed.        Thought Content: Thought content is not paranoid or delusional. Thought content does not include homicidal or suicidal ideation. Thought content does not include suicidal plan.        Cognition and Memory: Cognition normal. Memory is not impaired. She does not exhibit impaired recent memory.        Judgment: Judgment normal.     Comments: Insight intact. No auditory or visual hallucinations. No delusions.  Residual depression and anxiety about 60% better with Spravato but last week a little worse. Affect better with more spontaneity  now vs before Spravato.  less blunted  No Sui intent plan      Lab Review:     Component Value Date/Time   NA 137 01/12/2021 1430   NA 140 11/18/2018 1544   K 3.8 01/12/2021 1430   CL 108 01/12/2021 1430   CO2 22 01/12/2021 1430   GLUCOSE 94 01/12/2021 1430   BUN 14 01/12/2021 1430   BUN 20 11/18/2018 1544   CREATININE 0.82 01/12/2021 1430   CALCIUM 8.9 01/12/2021 1430   PROT 6.6 01/12/2021 1430   ALBUMIN 3.9 01/12/2021 1430   AST 12 (  L) 01/12/2021 1430   ALT 11 01/12/2021 1430   ALKPHOS 46 01/12/2021 1430   BILITOT 0.5 01/12/2021 1430   GFRNONAA >60 01/12/2021 1430   GFRAA >60 09/02/2019 0249   GFRAA >60 01/27/2019 0811        Component Value Date/Time   WBC 4.5 01/12/2021 1430   RBC 4.32 01/12/2021 1430   HGB 12.8 01/12/2021 1430   HGB 12.9 07/17/2019 0953   HCT 38.5 01/12/2021 1430   HCT 21.9 (L) 12/25/2018 1221   PLT 272 01/12/2021 1430   PLT 286 07/17/2019 0953   MCV 89.1 01/12/2021 1430   MCH 29.6 01/12/2021 1430   MCHC 33.2 01/12/2021 1430   RDW 12.4 01/12/2021 1430   LYMPHSABS 1.4 01/12/2021 1430   MONOABS 0.4 01/12/2021 1430   EOSABS 0.0 01/12/2021 1430   BASOSABS 0.0 01/12/2021 1430    No results found for: "POCLITH", "LITHIUM"   No results found for: "PHENYTOIN", "PHENOBARB", "VALPROATE", "CBMZ"   .res Assessment: Plan:    Recurrent major depression resistant to treatment [facility])  Attention deficit hyperactivity disorder (ADHD), predominantly inattentive type - Plan: amphetamine-dextroamphetamine (ADDERALL XR) 20 MG 24 hr capsule  Generalized anxiety disorder - Plan: LORazepam (ATIVAN) 1 MG tablet  Insomnia due to mental condition - Plan: LORazepam (ATIVAN) 1 MG tablet  Accelerated hypertension   She has treatment resistant major depression ongoing with 50% better with Spravato. Have  discussed some of her  abnormal behaviors last year leading to this depressive episode getting worse which she says were associated with heavy use of delta 8 and not a manic episode.  She realizes now that that was not good for her.  She stopped all use of other drugs including those available over-the-counter such as delta 8 or any other THC related products.  She is no longer having any of those types of behaviors and instead is depressed.  She remains depressed with lack of interest and lack of feeling for things that normally she would have feelings about.  She has low motivation and energy.  She is difficulty making decisions.  She is sad and down.  She is less productive than usual.  Her concentration is poor.  She has high degree of anxiety as well.  Her personality is more flat than normal for  her.. She has a very negative self-image and low self-esteem.  These are all uncharacteristic for her However all of these symptoms are partially improved through Spravato and the switch from Seroquel to olanzapine.  She is approximately 50% better.  She also feels worse if misses a dose of Spravato.  Patient was administered Spravato 84 mg intranasally today.  The patient experienced the typical dissociation which gradually resolved over the 2-hour period of observation.  There were no complications.  Specifically the patient did not have nausea or vomiting or headache.  Blood pressures monitored at the 40-minute and 2-hour follow-up intervals.  Borderline high.  By the time the 2-hour observation period was met the patient was alert and oriented and able to exit without assistance.  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  See nursing note for further details.She wants to continue Spravato. We discussed discussed the side effects in detail as well as the protocol required to receive Spravato.   Failed multiple antidepressants.  Many of them were not actual failures but intolerances and it is unclear whether some of that was more connected with anxiety than  true side effects.  1 example is the [place].  In general she does not want to try anything but an antidepressant but has failed all major categories of antidepressants except TCAs and MAO inhibitors which have not been tried until now. There is a consistent pattern of thinking she is worse with medication which is not consistent with objective evidence.    Her self assessment is clouded by her depression.  Started nortriptyline 75 mg nightly.  Serum level 176.  So Reduced to 50 mg HS  Discussed side effects in detail.  Needs more time to help. Extensive discussion previously about her ambivalence about meds and missatributing sx of depression as SE of meds.     continue Wellbutrin XL 450 mg AM Consider retrying Auvelity in place of Wellbutrin was discussed.  Consider Off label augmentation DM at a lower dose of 15 mg BID-defer longer  Recently increase pramipexole augmentation off label to 0.5 mg tID or 0.75 mg BID  Disc SE.  Looks like partial response with this. She wants to continue it.  Started Spravato 84 mg twice weekly on 03/16/2022.  Now on weekly administration  Adderall  XR 20 mg AM   Ativan 1 mg 3 times daily as needed anxiety but try to cut it back. Is not ideal to use benzodiazepine with stimulant but because of the severity of her symptoms it has been necessary.  Hope to eventually eliminate the benzodiazepine.  Expected as her depression improves her anxiety will improve as well.  However lately her anxiety has been unmanageable.  We will expect that to improve as the depression improves.  She has headed insturctions to reduce this.  Continue clonidine 0.2 mg BID off label for anxiety and helps BP partially. BP is better controlled but not consistent.  Consider increasing amlodipine.  Also on losartan 50  Discussed potential benefits, risks, and side effects of stimulants with patient to include increased heart rate, palpitations, insomnia, increased anxiety, increased irritability, or decreased appetite.  Instructed patient to contact office if experiencing any significant tolerability issues. She wants to return to usual dose of Adderall for ADD bc of mor poor cognitive function with reduction.  Also discussed that depression will impair cognitive function.  TMS consultation was initially denied .  She says she called [facility] about this but they have not scheduled an appt .Marland Kitchen   Consider consultation with [facility] if [facility] is not an option.  We discussed the pros and cons of this versus Spravato.  For now she wants to continue Spravato because it is clearly helpful.  Extensive discussion about her chronic ambivalence about  psychiatric medicines and tendency to blame medicine for the symptoms of depression that she had prior to even starting the medications.  After discussing this at length she was able to acknowledge that that is factual.  She does not appear to be having any significant side effects with the current medications.  However so far not much benefit with nortriptyline despite adequate level.  Has Maintained sobriety  FU with Spravato weekly   Meredith Staggers, MD, DFAPA     Please see After Visit Summary for patient specific instructions.  No future appointments.               No orders of the defined types were placed in this encounter.      -------------------------------

## 2022-11-27 NOTE — Telephone Encounter (Signed)
hold

## 2022-11-27 NOTE — Progress Notes (Signed)
NURSES NOTE:   Patient arrived for her 49th Spravato treatment. Pt is being treated for Treatment Resistant Depression, pt will be receiving 84 mg which will continue to be her maintenance dose, she receives weekly treatments.  Pt is also planning on [place] maybe the first of the year. Patient arrived and taken to treatment room. Confirmed she had a ride home which is her husband would be coming back to pick up pt when done and sometimes she needs to use [facility] if he is unable to pick her up. Pt's Spravato is ordered through [employer] and delivered to office, all Spravato medication is stored at doctors office per REMS/FDA guidelines. The medication is required to be locked behind two doors per FDA/REMS Protocol. Medication is also disposed of properly per regulations.   Began taking patient's vital signs at 11:15 AM 145/98, pulse 59, Pulse Ox 98%, Gave patient first dose 28 mg nasal spray, each nasal spray administered in each nostril as directed and waited 5 minutes between the second and third dose. All 3 doses given pt did not complain of any nausea/vomiting, given a cup of water due to the taste after the administration of Spravato.  She listens to [employer] with spa or relaxing music.  Checked 40 minute vitals at 12:00 PM, 127/86, pulse 72, Pulse Ox 92%. Explained she would be monitored for a total time of 120 minutes. Discharge vitals were taken at 1:30 PM 119/82 P 66, 99% Pulse Ox. Dr. Clovis Pu met with pt today and discussed her medication. I walked pt to elevator, where her husband met her for the ride home. Recommend she go home and sleep or just relax on the couch. No driving, no intense activities. Verbalized understanding. Nurse was with pt a total of 70 minutes for clinical. Pt is scheduled next Monday, January 22nd 2024. Pt instructed to call office with any problems or questions.      LOT [id number] EXP NOV 2026

## 2022-11-28 ENCOUNTER — Encounter: Payer: Self-pay | Admitting: Psychiatry

## 2022-12-03 ENCOUNTER — Ambulatory Visit (INDEPENDENT_AMBULATORY_CARE_PROVIDER_SITE_OTHER): Payer: 59 | Admitting: Psychiatry

## 2022-12-03 ENCOUNTER — Ambulatory Visit: Payer: 59

## 2022-12-03 VITALS — BP 114/83 | HR 72

## 2022-12-03 DIAGNOSIS — F5105 Insomnia due to other mental disorder: Secondary | ICD-10-CM | POA: Diagnosis not present

## 2022-12-03 DIAGNOSIS — F900 Attention-deficit hyperactivity disorder, predominantly inattentive type: Secondary | ICD-10-CM | POA: Diagnosis not present

## 2022-12-03 DIAGNOSIS — F411 Generalized anxiety disorder: Secondary | ICD-10-CM

## 2022-12-03 DIAGNOSIS — F339 Major depressive disorder, recurrent, unspecified: Secondary | ICD-10-CM | POA: Diagnosis not present

## 2022-12-03 DIAGNOSIS — I10 Essential (primary) hypertension: Secondary | ICD-10-CM

## 2022-12-05 ENCOUNTER — Encounter: Payer: Self-pay | Admitting: Psychiatry

## 2022-12-05 NOTE — Progress Notes (Signed)
Laura Chang [id number] [birth year] 67 y.o.  Subjective:   Patient ID:  Laura Chang is a 67 y.o. (DOB 01-Nov-1956) female.  Chief Complaint:  Chief Complaint  Patient presents with   Follow-up   Depression   Anxiety   ADD   Fatigue     24   Laura Chang presents to the office today for follow-up of depression and anxiety and ADD.  seen November 12, 2019.  Melted down in 2020.  Went to [facility] in July.  No withdrawal.  1 drink since then.  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 She was on no stimulant and no SSRI but was taking Strattera and Wellbutrin.  The following changes were made. Stop Strattera. OK restart stimulant bc severe ADD Restart Adderall 1 daily for a few days and if tolerated then restart 1 twice daily. If not tolerated reduce the dosage if needed. May need to stop Wellbutrin if not tolerating the stimulant.  Yes.  DC Wellbutrin Restart Prozac 20 mg daily.  February 2021 appointment with the following noted: Completed grant proposal.  Couldn't doit without Adderall.  Sold a bunch of work.   Adderall XR lasts about 3 pm.  Strength seems about right.  BP been OK.  Not jittery.   Stopped Wellbutrin but had no SE. Mood drastically better with grant proposal and back on fluoxetine.  Less depressed and lethargic.  No anxiety.  Cut back on coffee. Started back with devotions and stronger faith. Plan: Continue Prozac 20 mg daily. May have to increase the dose at some point in the future given that she usually was taking higher dosages but she is getting good response at this time. Restart Wellbutrin off label for ADD since can't get 2 ADDERALL daily. 150 mg daily then 300 mg daily. She can adjust the dose between 150 mg and 300 mg daily to get the optimal effect.   05/11/2020 appointment with the following noted: Has been inconsistent with Prozac and Wellbutrin. Not sure of the effect of Wellbutrin. Biggest deterrent in work  is anxiety.  Some of the work is conceptual and difficult at times.  Can feel she's not up to a project at times.  Overall is OK but would like a steadier benefit from stimulant.  Exhausted from managing concentration and keeping up with things from the day.  Loses things.  Not good keeping up with schedule. Overall productive and emotionally OK. Can feel Adderall wear off. Mood is better in summer and worse in the winter.   F died in 2023-10-18 and that is a loss. No SE Wellbutrin. Still attends AA meetings.  Real benefit from [facility] last year. Recognizes effect of anemia on ADD and mood.  Had iron infusions last winter. Plan:  Wellbutrin off label for ADD since can't get 2 ADDERALL daily. 150 mg daily then 300 mg daily.  01/24/2021 appointment with following noted: Doing a program called [employer] mindfulness app since Xmas.  CBT app helped the depression.  App helped her focus better.  Lost sign weight. Writing a lot. Before Xmas felt depressed and started negative thinking worse, self denigrating. Not drinking. More isolated.   Recognizes mo is narcissist.    Didn't tell anyone she was born until 3 mos later.  M aloof and uninterested in pt.  Lied about her birthday.  Mo lack of affection even with pt's kids. Going to AA for a year and it helped her to quit drinking.  Also misses kids being gone with a hole also. Plan: No med changes  05/04/2021 appointment with the following noted: Therapist [name] thinks she's manic. Lost weight to 144#.   States she is still sleeping okay.  Admits she is hyper and recognizes that she is likely manic.  She feels great, euphoric with an increased sense of spiritual connectedness to God.  She has racing thoughts and talks fast and talks a lot and this is noted by her husband.  He thinks she is a bit hyper.  She has been able to maintain sobriety although she will have 1 glass of wine on special occasions but does not drink by herself.  She is not  drinking to excess.  She denies any dangerous impulsivity.  She is clearly not depressed and not particularly anxious.  She has no concerns about her medication and she has been compliant.  06/16/21 appt noted: So much better.  Going through a lot but the manic thing happened on top of it.  So much slower.  Didn't feel like losing anything with risperidone.  Likes the Adderalll at 10 mg. Some drowsiness in the AM and very drowsy from risperidone 2 mg HS. Prayer life is better. Handling stress better. Less depressed with risperidone. Still likes trazodone. Sleeps well. Plan: Reduce Prozac to 10 mg daily.  Consider stopping it because it can feel the mania however she is reluctant to do that because she fears relapse of depression. Reduce risperidone to 1.5 mg nightly due to side effects.  Discussed risk of worsening mania.  07/25/2021 appointment with the following noted: Misses the Adderall and hard to function without it. Depressed now. Heavy chest.  Anxious and guilty.  Body feels heavy.   Hates Wellbutrin.   Plan: Increase fluoxetine to 20 mg daily Add Abilify 1/2 of 15 mg tablet daily Wean wellbutrin by 1 tablet each week  bc she feels it is not helpful and DT polypharmacy Reduce risperidone to 1 daily for 1 week and stop it. Disc risk of mania. Increase Adderall to XR 20 mg AM  08/08/21 Much less depressed and starting to feel normal I feel a lot better. No SE.  Speech normal off risperidone. Sleeping OK on trazaodone and enough.   Noticed benefit from Adderall again. Plan: continue fluoxetine to 20 mg daily Continue Abilify 1/2 of 15 mg tablet daily for depression and mania continue Adderall to XR 20 mg AM  10/10/2021 phone call: Pt stated she feels like the Abilify should be decreased to 5mg .She said she is depressed but rational and not suicidal.She has an appt Monday and can wait until then if you prefer. MD response: Reduce the Abilify to 7.5 mg every other day.  We will meet on  10/16/2021 and decide what to do from there.  10/16/2021 appointment with the following noted: More depressed.  Most depressed I've ever been.  Just numb.  Sense of grief.   Thinks the manic episode was unlike anything else she ever had.  Doesn't want to medicate against it.  Don't enjoy people.  Easily overwhelmed.  Had some death thoughts but not suicidal.  Has been functional.  Feels better today after reducing Abilify to every other day but she is only been doing that for 3 days. A/P: Episode of post manic depression was explained. continue fluoxetine to 20 mg daily Hold Abilify for 1 week then resume Abilify 1/2 of 15 mg tablet every other day for depression and mania continue Adderall to XR  20 mg AM  10/27/2021 appointment with the following noted: I'm doing so much better.  Handling the depression better. Better self talk and spiritual focus has helped.   Dep 6/10 manifesting as anxiety with low confidence.   F died 2  years ago and M 67 yo and is dependent . She is working hard to feel better but still feels depressed.  She almost feels like she has a little more anxiety since restarting Abilify every other day. Plan: continue fluoxetine to 20 mg daily DC Abilify .  Vrayalar 1.5 mg QOD to try to get rid of depression ASAP. continue Adderall to XR 20 mg AM  11/10/2021 appointment with the following noted: Busy with Xmas and it was fun with family but then a big let down.  Did well with it.  Functioned well with it.  Working hard on things with depression.  Not shutting down. Not sure but feels better today but yesterday was hard.  Difficulty dealing with mother.  She won't do anything to help herself.  Yesterday with her all day.  Won't do PT and has isolated herself.    Lack of confidence.   No SE with Vraylar.  11/24/21 urgent appointment appt noted: More and more depressed.   So anxious and doesn't want to be alone but can do so. No appetite. Hurts inside. Has had some fleeting  suicidal thoughts but would not act on them.  Tolerating meds. Has been consistent with Vraylar 1.5 mg every other day, fluoxetine 20 mg daily Plan: Increase Vraylar to 1.5 mg daily Change Prozac to Trintellix 10 mg daily. Discussed side effects of each continue Adderall to XR 20 mg AM  12/27/2021 appointment with the following noted: Not OK.  I feel less depressed but feels bat shit. Not sleeping well.  Extremely anxious. Off and on sleep. 3-4 hours of sleep.   Still having daily SI.  But also become obvious has so much to do.  Overwhelmed by tasks.   Needs anxiety meds to just function. Not more motivated.  Walked yesterday.   Feels afraid like in trouble but not irritable or angry. DC DT agitation Vraylar to 1.5 mg daily Change Prozac to Trintellix 10 mg daily. Hold Adderall to XR 20 mg AM Clonidine 0.1 1/2 tablet twice daily for 2 days and if needed for anxiety and sleep increase to 1 twice daily Ok temporary Ativan 1 mg 3 times daily as needed anxiety  01/05/22 appt noted: Off fluoxetine and  Trintellix.  Only on Ativan, trazodone and Adderall XR 20 plus added clonidine 0.1 mg BID Didn't think she needed to start Trintellix. Not taking Ativan.   Didn't like herself last week. Feels some better today. Wonders if the manic sx Not agitated.  Anxiety kind of calmed down.  A lot to be anxious about situationally.  $ stress. Concerns about downers with meds. Can't access normal personality. ? Lethargy and inability to talk as sE. Plan: Latuda 20-40 mg daily with food. Adderall to XR 20 mg AM Clonidine 0.1 1/2 tablet twice daily  reduce dose to be sure no SE Ok temporary Ativan 1 mg 3 times daily as needed anxiety  01/19/22 appt noted: Taking Latuda 20 mg daily.  Took 40 mg once and felt anxious and  SI Still depressed and not very reactive Anxiety mainly about the depression and fears of the future. She wants to revisit manic sx and thinks it was maybe bc taking delta 8 bc was  taking a  lot of it so still doesn't think she's classic bipolar. She wants to only take Prozac bc thinks Latuda is perpetuating depression. Says the delta 8 was very psychaedelic.  When not taking it was not manic.  Sleeping ok again.  Plan: Per her request DC Latuda 20-40 mg daily with food. She wants to continue Prozac alone AMA  Adderall to XR 20 mg AM Clonidine 0.1 1/2 tablet twice daily  reduce dose to be sure no SE Ok temporary Ativan 1 mg 3 times daily as needed anxiety  01/23/2022 phone call complaining of increased anxiety since stopping Latuda.  She will try increasing clonidine.  01/26/2022 phone call not feeling well and wanted to restart the Vraylar.  However notes indicate that had made her agitated therefore she was encouraged to pick up samples of Rexulti 1 mg and start that instead.  02/06/2022 phone call: Stating she felt the Rexulti was helping with depression but she was not sleeping well and obsessing over things.  She was encouraged to increase Rexulti to 2 mg daily and increase trazodone for sleep.  02/09/2022 appointment with the following noted: This was an urgent work in appointment No sleep last night with trazodone 100 mg HS Nothing really better depression or anxiety. Ruminating negative anxious thoughts. Did not tolerate Rexulti because it was causing insomnia.  Does not think it helped depression.  Lacks emotion that she should have.  Lacks her usual personality.  Some hopeless thoughts.  Some death thoughts.  Some suicidal thoughts without plan or intent Plan: DC Rexulti and Prozac & DC trazodone Adderall to XR 20 mg AM Clonidine 0.1 1/tablet twice daily  reduce dose to be sure no SE Ok temporary Ativan 1 mg 3 times daily as needed anxiety Start Seroquel XR 150 mg nightly  03/02/2022 appointment: Angelique Blonder called back a few days after starting Seroquel stating it was making her more anxious and more depressed.  This seemed unlikely as this medicine rarely ever  causes anxiety.  She stopped the medication waited 3 days and called back still had anxiety and depression but thought perhaps the anxiety was a little better.  She did not want to take the Seroquel. She knew about the option of Spravato and wanted to pursue that. Now questions whether to return to Seroquel while waiting to start Spravato bc feels just as bad without it and knows she didn't give it enough time to work.   MADRS 46  ECT-MADRS    Flowsheet Row Clinical Support from 08/06/2022 in [facility] Clinical Support from 07/04/2022 in [facility] Clinical Support from 05/21/2022 in [facility] Office Visit from 03/02/2022 in [facility]  MADRS Total Score 29 15 27  46      03/14/22 appt noted: Pt received Spravato 56 mg first dose today with some dissociative sx which were not severe.  She was anxious prior to the administration and felt better after receiving lorazepam 1 mg.  No NV, or HA. Wants to continue Spravato. Ongoing depression and desperate to feel better.  I'm not myself DT deprsssion which is most severe in recent history.  Anhedonia.  Low motivation.  Social avoidance. Continues to think all recent med trials are making her worse.  Sleep ok with Seroquel.  03/16/22 appt noted: Received Spravato 84 mg for the first time.  some dissociative sx which were not severe.  She was anxious prior to the administration and felt better after  receiving lorazepam 1 mg.  No NV, or HA. Wants to continue Spravato.   Does not feel any better or different since the last appt.  Ongoing depression.  Ongoing depression and desperate to feel better.  I'm not myself DT deprsssion which is most severe in recent history.  Anhedonia.  Low motivation.  Social avoidance. Continues to think all recent med trials are making her worse.  Sleep ok with Seroquel.  Does not want to continue Seroquel for  TRD.  03/20/2022 appointment noted: Came for Spravato administration today.  However blood pressure was significantly elevated approximately 180/115.  She was given lorazepam 1 mg and clonidine 0.2 mg to try to get it down. She states she regretted stopping the Seroquel XR 300 mg tablets.  She now realizes it was helpful.  She did not sleep much at all last night.  She did not take the Adderall this morning. 2 to 3 hours after arrival blood pressure was still elevated at  170/110, 62 pulse.  For Spravato administration was canceled for today.  She admits to being anxious and depressed.  She is not suicidal.  She is highly motivated to receive the Spravato.  We discussed getting it tomorrow.  03/22/2022 appointment noted: Patient's blood pressure was never stable enough yesterday in order to get her in for Spravato administration.  She was encouraged to see her primary care doctor.  It is better today.  03/26/2022 appointment with the following noted: Blood pressure was better.  Saw her primary care doctor who started on oral Bystolic 2.5 mg daily. Received Spravato 84 mg today as scheduled.  Tolerated it well without nausea or vomiting headache or chest pain or palpitations.  Her blood pressure was borderline but manageable. She remains depressed and anxious.  She is ambivalent about the medicine and desperate to get to feel better.  Continues to have anhedonia and low energy and low motivation and reduced ability to do things.  Less social.  Not suicidal.  03/28/22 appt noted: Received Spravato 84 mg today as scheduled.  Tolerated it well without nausea or vomiting headache or chest pain or palpitations.  Her blood pressure was borderline but manageable. Has not seen any improvement so far.  Tolerating Seroquel.  Inconsistent with Bystolic and BP has been borderline high. Still depressed and anxious and anhedonia.  Low motivation, energy, productivity. Taking quetiapine and tolerating XR 300 mg  nightly.  04/04/22 appt noted: Received Spravato 84 mg today as scheduled.  Tolerated it well without nausea or vomiting headache or chest pain or palpitations.  Her blood pressure was borderline but manageable. Has not seen any improvement so far.  Tolerating Seroquel.   She still tends to think that the medications are making her worse.  She has said this about each of the recent psychiatric medicines including Seroquel.  However her husband thinks she is improved.  She also admits there is some improvement in productivity.  She still feels highly anxious.  She still does not enjoy things as normal.  She still feels desperate to improve as soon as possible. Has been taking Seroquel XR since 03/20/2022  04/10/22 appt noted: Received Spravato 84 mg today as scheduled.  Tolerated it well without nausea or vomiting headache or chest pain or palpitations.  Her blood pressure was borderline but manageable. Has not seen any improvement so far.  Tolerating Seroquel.  Doesn't like Seroquel bc she thinks it flattens here. Ongoing depression without confidence Plan: Start Auvelity 1 every morning for persistent  treatment resistant depression  04/12/2022 appointment with the following noted: Received Spravato 84 mg today as scheduled.  Tolerated it well without nausea or vomiting headache or chest pain or palpitations.  Her blood pressure was borderline but manageable. Has not seen any improvement so far.  Tolerating Seroquel.  Doesn't like Seroquel bc she thinks it flattens her. Received Spravato 84 mg today as scheduled.  Tolerated it well without nausea or vomiting headache or chest pain or palpitations.  Her blood pressure was borderline but manageable. Has not seen any improvement so far.  Tolerating Seroquel.  Doesn't like Seroquel bc she thinks it flattens here.  We discussed her ambivalence about it. She is starting Auvelity and has tolerated it the last 2 days without side effect.  She still does not  feel like herself and feels flat and not enjoying things with suppressed expressed emotion  04/17/2022 appointment with the following noted: Received Spravato 84 mg today as scheduled.  Tolerated it well without nausea or vomiting headache or chest pain or palpitations.  Her blood pressure was borderline but manageable. Has not seen any improvement so far.  Tolerating Seroquel.  Doesn't like Seroquel bc she thinks it flattens her. She has been tolerating the Auvelity 1 in the morning without side effects for about a week.  She has not noticed significant improvement so far.  She still feels depressed and flat and not herself.  Other people notice that she is flat emotionally.  She is not suicidal.  She does feel discouraged that she is not getting better yet.  04/19/2022 appointment noted: Has increased Auvelity to 1 twice daily for 2 days, continues quetiapine XR 300 mg nightly, clonidine 0.3 mg twice daily, lorazepam 1 mg twice daily for anxiety and Adderall XR 20 mg in the morning. No obious SE but she still thinks quetiapine XR is making her feel down.  But not sedated Received Spravato 84 mg today as scheduled.  Tolerated it well without nausea or vomiting headache or chest pain or palpitations.  Her blood pressure was borderline but manageable. She still feels quite anxious and feels it necessary to take both the clonidine and lorazepam twice a day to manage her anxiety.  She has been consistently down and flat and not herself until yesterday afternoon she noted an improvement in mood and feeling much more like herself with her normal personality reemerging.  She was quite depressed in the morning with very dark negative thoughts.  She did not have those dark negative thoughts this morning.  She had a lot of questions about medication and when she was expecting to be improved and why she has not shown improvement up to now.  04/23/22 appt noted: Has increased Auvelity to 1 twice daily for 1 week,  continues quetiapine XR 300 mg nightly, clonidine 0.3 mg twice daily, lorazepam 1 mg twice daily for anxiety and Adderall XR 20 mg in the morning. No obious SE but she still thinks quetiapine XR is making her feel down.  But not sedated Received Spravato 84 mg today as scheduled.  Tolerated it well without nausea or vomiting headache or chest pain or palpitations.  She is still depressed but admits better function and is able to enjoy social interactions. Tolerating meds.  Would like to feel better for sure. Not herself.  Flat. Plan increase Auvelity to 1 tab BID as planned and reduce Quetiapine to 1/2 of ER 300 mg  bc NR for depression.  04/25/2022 appointment with the following noted:  clonidine 0.3 mg twice daily, lorazepam 1 mg twice daily for anxiety and Adderall XR 20 mg in the morning. Seroquel XR 300 HS No obious SE but she still thinks quetiapine XR is making her feel down.  But not sedated Received Spravato 84 mg today as scheduled.  Tolerated it well without nausea or vomiting headache or chest pain or palpitations.  Called yesterday with more anxiety.  Had increased Auvelity for 1 day and reduced Seroquel XR for 1 day.  Felt restless and fearful  05/01/2022 appointment noted: clonidine 0.3 mg twice daily, lorazepam 1 mg twice daily for anxiety and Adderall XR 20 mg in the morning. Seroquel XR 150 HS, Auvelity 1 BID Received Spravato 84 mg today as scheduled.  Tolerated it well without nausea or vomiting headache or chest pain or palpitations.  Nurse has noted patient has called multiple times sometimes asking the same question repeatedly.  It is unclear whether she is truly forgetful or is just anxious seeking reassurance. Patient acknowledges ongoing depression as well as some anxiety but states she has felt a little better in the last couple of days.  She has reduced the Seroquel to 150 mg at night and has increased Auvelity to 1 twice daily but only for 1 day.  So far she seems to be  tolerating it.  05/03/22 appt noted: clonidine 0.2 mg twice daily, lorazepam 1 mg twice daily for anxiety and Adderall XR 20 mg in the morning. Seroquel XR 150 HS, Auvelity 1 BID BP high this am about 170/100 and received extra clonidine 0.2 mg and came to receive Spravato.  Not dizzy, no SOB, nor CP but BP is still high Could not receive Spravato today bc BP high and pulse low at 30 ppm. Still depressed and anxious. Plan: continue trial Auvelity with Spravato She needs to get BP and pulse managed  05/08/22 TC: RTC  H Michael NA and mailbox full.  Could not leave message.  Pt  -  talked to she and H on speaker. H worried over wife.  Vacant stare.  Slurs words at times.  Not smiling. Reduced enjoyment.  Depression.  Withdrawn from usual activities.  Some irritability.  Anxious. Disc her concerns meds are making her worse.  Extensive discussion about her treatment resistant status.  There is a consistent pattern of not taking the medicines long enough to get benefit because she believes the meds are making her worse.  However the symptoms she describes as side effects are exactly the same symptoms that she had prior to taking the medication RX for  the depression.  So it is not clear that these are actual side effects. This is true about the 2 most recent meds including Seroquel and Auvelity.  Recommend psychiatric consultation in hopes of improving her comfort level with taking prescribed medications for a sufficient length of time to provide benefit. Extensive discussion about ECT is the treatment of choice for treatment resistant depression.  Spravato may work if she can comply with consistency.  There are medication options but they take longer to work.   Plan:  Reduce clonidine to 0.1 mg BID DT bradycardia.  Talk with PCP about BP and low pulse problems which are interfering with her consistent compliance with Spravato.   Limit lorazepam to 3 -4  mg daily max. Excess use is the cause of slurring  speech.  She must stop excess use or will have to stop the med. Stop Auvelity per her request.  But she has  only been on the full dose for a little over a week and clearly has not had time to get benefit from it.  She thinks maybe it is making her more anxious. Reduce Seroquel from 150XR to 50 -100 mg at night IR.  She couldn't sleep when stopped it completely. Will not start new antidepressant until her SE issues are resolved or not. Get second psych opinion from Wellington Hampshire MD or another psychiatrist.  H's sister is therapist in [place] Rink, MD, [facility]  05/16/2022 appointment with the following noted: Received Spravato 84 mg today as scheduled.  Tolerated it well without nausea or vomiting headache or chest pain or palpitations.  She stopped Auvelity as discussed last week. On her own, without physician input, she restarted Wellbutrin XL 450 mg every morning today.  She had taken it in the past.  She feels jittery and anxious. She feels less depressed than she did last week.  But she is still depressed without her usual range of affect.  She still is less social and less motivated than normal. Her primary care doctor increased the dose of losartan Plan: Stop Seroquel Reduce Wellbutrin XL to 300 mg every morning.  Starting the dose at 450 every morning is likely causing side effects of jitteriness and it should not be started at that have a dosage. Recommend she not change meds on her own without MDM put  05/23/2022 appointment with the following noted: Received Spravato 84 mg today as scheduled.  Tolerated it well without nausea or vomiting headache or chest pain or palpitations.  Has not dropped seroquel XR 300 mg 1/2 tablet nightly bc couldn't sleep without it. Has not tried lower dose quetiapine 50 mg HS Still feels depressed.   BP is better managed so far, just saw PCP.  BP is better today and infact is low today. Dropped clonidine as directed from 0.3 mg BID bc inadequate control  of BP to 0.2 mg BID.  However she wants to increase it back to 0.3 mg twice daily because she feels it helped her anxiety better.  Wonders about increasing Wellbutrin for depression.  However she has only been on 300 mg a day for a week.  She was on 450 mg daily in the past.  06/06/22 appt noted: Received Spravato 84 mg today as scheduled.  Tolerated it well without nausea or vomiting headache or chest pain or palpitations.  She is still depressed and anxious.  She wants to try to stop the Seroquel but cannot sleep without some of it.  She is taking lorazepam 1 mg 4 times daily and still having a lot of anxiety.  She wants to increase clonidine back to 0.3 mg twice daily.  She hopes for more improvement She recently went for a second psychiatric opinion as suggested the results of that are pending.  06/11/22 appt noted: Received Spravato 84 mg today as scheduled.  Tolerated it well without nausea or vomiting headache or chest pain or palpitations.  She is still depressed and anxious. Without much change.  Still hopeless, anhedonia, reduced inteterest and motivation.  Tolerating meds. Disc concerns Spravato is not hleping much. Plan: stop Seroquel and start olanzapine 10 mg HS for TRD and anxiety.  06/13/2022 appointment noted: Received Spravato 84 mg today as scheduled.  Tolerated it well without nausea or vomiting headache or chest pain or palpitations.  She is still depressed and anxious. Without much change.  Still hopeless, anhedonia, reduced inteterest and motivation.  Tolerating meds. Disc  concerns Spravato is not helping much as hoped but is improving a bit in the last week. Tolerating meds. Continues Wellbutrin XL 450 AM, tolerating recently started olanzapine  10 mg HS. Sleep is good.   Pending appt with [place] consult.  06/18/22 appt noted: Received Spravato 84 mg today as scheduled.  Tolerated it well without nausea or vomiting headache or chest pain or palpitations.  Tolerating  meds. Continues Wellbutrin XL 450 AM, tolerating recently started olanzapine  10 mg HS. Continues Adderall XR 20 amd and has tried to reduce lorazepam to 1mg  TID Sleep is good.   Pending appt with [place] consult. Depression is a little bit better in the last week with a little improvement in emotional expression and interest.  She is pushing herself to be more active.  Her daughter thought she was a little better than she has been.  However she is still depressed and still not her normal self with anhedonia and reduced emotional expressiveness.  06/20/22 appt noted: Received Spravato 84 mg today as scheduled.  Tolerated it well without nausea or vomiting headache or chest pain or palpitations.  Tolerating meds with a little sleepiness. Continues Wellbutrin XL 450 AM, tolerating recently started olanzapine  10 mg HS. Continues Adderall XR 20 amd and has tried to reduce lorazepam to 1mg  TID Sleep is good.   Mood is improving.  Better funciton.  Anxiety is better with olanzapine. Still not herself and depression not gone with some anhedonia and social avoidance and feeling overwhelmed.  8/14 2023 received Spravato 84 mg 06/27/2022 received Spravato Spravato 84 mg 07/02/2022 received Spravato 84 mg 07/04/2022 received Spravato 84 mg  07/09/2022 appointment noted: Received Spravato 84 mg today as scheduled.  Tolerated it well without nausea or vomiting headache or chest pain or palpitations.  Expected dissociation and feels less depressed with resolution of negative emotions immediately after Spravato and then depression, anxiety creep back in. Continues meds Adderall XR 20 mg every morning, Wellbutrin XL 450 every morning, clonidine 0.1 mg twice daily, lorazepam 1 mg every 6 hours as needed, olanzapine increased from 7.5 to 10 mg nightly on  Tolerating meds.  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  She does note some increased appetite and is somewhat concerned about that but has not gained significant amounts of weight. She has had the [place] consultation which was initially denied but she knows it can be appealed.  However because she is improving with Spravato plus the other medications now she wants to continue the current treatment plan.  07/18/22 appt noted: Continues meds Adderall XR 20 mg every morning, Wellbutrin XL 450 every morning, clonidine 0.1 mg twice daily, lorazepam 1 mg every 6 hours as needed, olanzapine increased from 7.5 to 10 mg nightly on 07/04/2022. Received Spravato 84 mg today as scheduled.  Tolerated it well without nausea or vomiting headache or chest pain or palpitations.  Expected dissociation and feels less depressed with resolution of negative emotions immediately after Spravato and then depression, anxiety creep back in. Continues meds Adderall XR 20 mg every morning, Wellbutrin XL 450 every morning, clonidine 0.1 mg twice daily, lorazepam 1 mg every 6 hours as needed, olanzapine increased from 7.5 to 10 mg nightly on  Tolerating meds.  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  She does note some increased appetite and is somewhat concerned about that but has not gained significant amounts of weight. She has had the TMS consultation which was initially denied but she knows it can be appealed. She continues to have chronic ambivalence about psychiatric medicines and initially tends to blame her depressive symptoms such as decreased concentration and feeling flat on what ever medicine she currently is taking even though she had the same symptoms before the current medicines were started.  Then after discussion she does admit that her depressive symptoms are improved since adding olanzapine but still has those residual symptoms  noted.  07/23/22 received Spravato 84 mg   07/30/2022 appointment noted: Received Spravato 84 mg today as scheduled.  Tolerated it well without nausea or vomiting headache or chest pain or palpitations.  Expected dissociation and feels less depressed with resolution of negative emotions immediately after Spravato and then depression, anxiety creep back in. Continues meds Adderall XR 20 mg every morning, Wellbutrin XL 450 every morning, clonidine 0.1 mg twice daily, lorazepam 1 mg every 6 hours as needed, olanzapine increased from 7.5 to 10 mg nightly on  She has been inconsistent with olanzapine because she continues to be ambivalent about the medications in general and thinks that perhaps the 10 mg is making her feel blunted.  She continues to feel some depression.  She had a good day this week and but still feels somewhat depressed and persistently anxious. Plan: be consistent with olanzapine 10 mg HS for TRD and longer trial for potential benefit for anxiety.  Has not taken it consistently.  08/06/2022 appointment noted: Received Spravato 84 mg today as scheduled.  Tolerated it well without nausea or vomiting headache or chest pain or palpitations.  Expected dissociation and feels less depressed with resolution of negative emotions immediately after Spravato and then depression, anxiety creep back in. Continues meds Adderall XR 20 mg every morning, Wellbutrin XL 450 every morning, clonidine 0.1 mg twice daily, lorazepam 1 mg every 6 hours as needed, olanzapine i 10 mg nightly  She continues to feel depressed but is about 50% better with Spravato.  She is still not herself.  She still has anhedonia.  She still is not her able to engage socially in the typical ways.  She is not jovial and outgoing like normal.  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  She continues to feel that her  personality is dampened down.  There is a question about whether it is related to depression or medication. Plan: continue olanzapine 10 for longer trial for TRD and severe anxiety.  08/13/22 appt noted:  Received Spravato 84 mg today as scheduled.  Tolerated it well without nausea or vomiting headache or chest pain or palpitations.  Expected dissociation and feels less depressed with resolution of negative emotions immediately after Spravato and then depression, anxiety creep back in. Continues meds Adderall XR 20 mg every morning, Wellbutrin XL 450 every morning, clonidine 0.1 mg twice daily, lorazepam 1 mg every 6 hours as needed, olanzapine i 10 mg nightly  She still does not feel herself.  Still struggles with depression and low motivation and reduced social engagement and reduced interest and reduced emotional expression.  She is somewhat better with the medicines plus Spravato.  She still believes the Spravato makes her blunted and is not sure how much it  helps her anxiety.  She can have good days when her family is around and she is engaged.  She still wants to stop the olanzapine. She has apparently continued to take the trazodone despite having been told to stop it when she started olanzapine.  She feels like she needs the trazodone. Plan: DC olanzapine and Start nortriptyline 25 mg nightly and build up to 75 mg nightly and then check blood level.    08/27/2022 appointment noted: Received Spravato 84 mg today as scheduled.  Tolerated it well without nausea or vomiting headache or chest pain or palpitations.  Expected dissociation and feels less depressed with resolution of negative emotions immediately after Spravato and then depression, anxiety creep back in. Continues meds Adderall XR 20 mg every morning, Wellbutrin XL 450 every morning, clonidine 0.1 mg twice daily, lorazepam 1 mg every 6 hours as needed. Stopped olanzapine and started nortriptyline which she has taken for about a week is 75  mg nightly. So far she is tolerating the nortriptyline well with the exception of some dry mouth and constipation which she is working to manage.  She does not feel substantially better better or different off the olanzapine.  No change in her sleep which is good.  Main concern currently in addition to the residual depression is anxiety which is somewhat situational with pending arch show.  She is worrying about it more than normal.  Says she is having to take lorazepam twice a day where she had been able to keep reduce it prior to this.  She still does not feel like herself with residual depression with less social interest and less of her usual buoyancy in personality.  She is flatter than normal.  Overall she still feels that the Spravato has been helpful at reducing the severity of the depression.  She is not suicidal. She has not heard anything about the TMS appeal as of yet.  09/05/2022 appointment noted: Received Spravato 84 mg today as scheduled.  Tolerated it well without nausea or vomiting headache or chest pain or palpitations.  Expected dissociation and feels less depressed with resolution of negative emotions immediately after Spravato and then depression, anxiety creep back in. Continues meds Adderall XR 20 mg every morning, Wellbutrin XL 450 every morning, clonidine 0.1 mg twice daily, lorazepam 1 mg every 6 hours as needed. Stopped olanzapine and started nortriptyline which she has taken for about 2 week is 75 mg nightly. Initially blood pressure was a little high causing delay in starting Spravato.  She admitted to feeling a little wound up.  She still experiences a little increase in depression if she goes longer than a week in between doses of Spravato.  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  09/10/22 appt noted: Received Spravato 84 mg today as scheduled.  Tolerated it well  without nausea or vomiting headache or chest pain or palpitations.  Expected dissociation gradually resolved over the 2 hour observation period. She feels 50% less depressed with Spravto and wants to continue it.   Continues meds Adderall XR 20 mg every morning, Wellbutrin XL 450 every morning, clonidine 0.1 mg twice daily, lorazepam 1 mg every 6 hours as needed. Has started nortriptyline 75 mg nightly for about 3 weeks. Has not seen a significant difference with the addition of nortriptyline.  Tolerating it pretty well. She continues to have some degree of anhedonia  and significant depression and anxiety.  Her daughters noticed that she is more needy and calls more frequently.  She acknowledges this as well.  She is clearly still not herself. Plan: pramipexole off label and RX 0.25 mg BID  09/17/2022 appointment noted: Received Spravato 84 mg today as scheduled.  Tolerated it well without nausea or vomiting headache or chest pain or palpitations.  Expected dissociation gradually resolved over the 2 hour observation period. She feels 50% less depressed with Spravto and wants to continue it.   Continues meds Adderall XR 20 mg every morning, Wellbutrin XL 450 every morning, clonidine 0.1 mg twice daily, lorazepam 1 mg every 6 hours as needed. Has started nortriptyline 75 mg nightly for about 3 weeks and DT level 176, reduced to 50 mg HS early November. Still the same sx as noted last visit.  Tolerating meds.   Compliant.  Still depressed and family notices.  Has been able to participate in family interactions.  Some post-show let down and has to do detailed work which is hard for her bc ADD.  Sleep and eating well.  Energy OK but not great.  No SI.  Not cried in a year or so.  Clearly less depressed and hopeless than before the Spravato.  09/24/22 appt noted: Received Spravato 84 mg today as scheduled.  Tolerated it well without nausea or vomiting headache or chest pain or palpitations.  Expected  dissociation gradually resolved over the 2 hour observation period. She feels 50% less depressed with Spravto and wants to continue it.   Continues meds Adderall XR 20 mg every morning, Wellbutrin XL 450 every morning, clonidine 0.1 mg twice daily, lorazepam 1 mg every 6 hours as needed. Has started nortriptyline 75 mg nightly for about 3 weeks and DT level 176, reduced to 50 mg HS early November. Still the same sx as noted last visit.  Tolerating meds.   Compliant.  Still depressed and family notices.  Has been able to participate in family interactions.  Some post-show let down and has to do detailed work which is hard for her bc ADD.  Sleep and eating well.  Energy OK but not great.  No SI.  Not cried in a year or so.  Clearly less depressed and hopeless than before the Spravato. Is not making further progress generally.  Stuck with moderate depression  10/02/22 appt noted: Received Spravato 84 mg today as scheduled.  Tolerated it well without nausea or vomiting headache or chest pain or palpitations.  Expected dissociation gradually resolved over the 2 hour observation period. She feels 50% less depressed with Spravto and wants to continue it.   Continues meds Adderall XR 20 mg every morning, Wellbutrin XL 450 every morning, clonidine 0.1 mg twice daily, lorazepam 1 mg every 6 hours as needed. Has started nortriptyline 75 mg nightly for about 3 weeks and DT level 176, reduced to 50 mg HS early November. Still the same sx as noted last visit.  Tolerating meds.   Compliant.  Still depressed and family notices.  Has been able to participate in family interactions.  Some post-show let down and has to do detailed work which is hard for her bc ADD.  Sleep and eating well.  Energy OK but not great.  No SI.  Not cried in a year or so.  Clearly less depressed and hopeless than before the Spravato. Is not making further progress generally.  Stuck with moderate depression.  Is able to function pretty  normally. Plan:  trial pramipexole 0.25 mg BID off label for depression.   10/08/22 appt noted: Received Spravato 84 mg today as scheduled.  Tolerated it well without nausea or vomiting headache or chest pain or palpitations.  Expected dissociation gradually resolved over the 2 hour observation period. She feels 50% less depressed with Spravto and wants to continue it.   Continues meds Adderall XR 20 mg every morning, Wellbutrin XL 450 every morning, clonidine 0.1 mg twice daily, lorazepam 1 mg every 6 hours as needed. Has started nortriptyline 75 mg nightly for about 3 weeks and DT level 176, reduced to 50 mg HS early November. Still the same sx as noted last visit.  Tolerating meds.   Compliant.  Still depressed and family notices.  Has been able to participate in family interactions.  Some post-show let down and has to do detailed work which is hard for her bc ADD.  Sleep and eating well.  Energy OK but not great.  No SI.  Not cried in a year or so.  Clearly less depressed and hopeless than before the Spravato. Is not making further progress generally.  Stuck with moderate depression.  Behaved and felt pretty normally with family over for Thanksgiving. Doesn't see benefit or SE with pramipexole but thinks maybe it makes her worse. Plan:  increase pramipexole augmentation off label to 0.5 mg BID  10/15/2022 appointment noted: Received Spravato 84 mg today as scheduled.  Tolerated it well without nausea or vomiting headache or chest pain or palpitations.  Expected dissociation gradually resolved over the 2 hour observation period. She feels 50% less depressed with Spravto and wants to continue it.   Continues meds Adderall XR 20 mg every morning, Wellbutrin XL 450 every morning, clonidine 0.2 mg twice daily, lorazepam 1 mg every 6 hours as needed.  Trazodone 50 mg tablets 1-2 nightly as needed insomnia Has started nortriptyline 75 mg nightly for about 3 weeks and DT level 176, reduced to 50 mg HS  early November. Recommended increase pramipexole 0.5 mg twice daily off label for treatment resistant depression on 10/08/2022. She feels better motivated more active with pramipexole 0.5 mg twice daily.  She still is depressed but it is better.  We discussed the possibility of going up in the dose but did not change it.  10/22/2022 appointment noted: Received Spravato 84 mg today as scheduled.  Tolerated it well without nausea or vomiting headache or chest pain or palpitations.  Expected dissociation gradually resolved over the 2 hour observation period. She feels 50% less depressed with Spravto and wants to continue it.   Continues meds Adderall XR 20 mg every morning, Wellbutrin XL 450 every morning, clonidine 0.2 mg twice daily, lorazepam 1 mg every 8 hours as needed.  Trazodone 50 mg tablets 1-2 nightly as needed insomnia Has started nortriptyline 75 mg nightly for about 3 weeks and DT level 176, reduced to 50 mg HS early November. pramipexole 0.5 mg twice daily off label for treatment resistant depression on 10/08/2022. She is better motivated than she was.  She is journaling 3 pages a day.  She has started walking and has walked 5 days a week for 50 minutes for the last 2 weeks.  That is significantly helped her mood.  Her mood tends to be better when she interacts with family.  However she still has some periods of depression.  She is tolerating the medications well.  She still feels like her affect and confidence is not back to normal. Plan:  increase  pramipexole augmentation off label to 0.5 mg tID or 0.75 mg BID   10/29/22 appt noted: Received Spravato 84 mg today as scheduled.  Tolerated it well without nausea or vomiting headache or chest pain or palpitations.  Expected dissociation gradually resolved over the 2 hour observation period. She feels 50% less depressed with Spravto and wants to continue it.   Continues meds Adderall XR 20 mg every morning, Wellbutrin XL 450 every morning,  clonidine 0.2 mg twice daily, lorazepam 1 mg every 8 hours as needed.  Trazodone 50 mg tablets 1-2 nightly as needed insomnia Has started nortriptyline 75 mg nightly for about 3 weeks and DT level 176, reduced to 50 mg HS early November. pramipexole increased to 0.75 mg twice daily off label for treatment resistant depression  She has been feeling somewhat better with the increase in pramipexole and is tolerating it well.  She still is easily overwhelmed.  Her affect and mood can improve now when around her family or doing something positive.  She has been able to be more productive. She is tolerating the current medications. She is still considering TMS as an alternative to Spravato.  11/15/2022 appointment noted: Received Spravato 84 mg today as scheduled.  Tolerated it well without nausea or vomiting headache or chest pain or palpitations.  Expected dissociation gradually resolved over the 2 hour observation period. She feels 50% less depressed with Spravto and wants to continue it.   Continues meds Adderall XR 20 mg every morning, Wellbutrin XL 450 every morning, clonidine 0.2 mg twice daily, lorazepam 1 mg every 8 hours as needed.  Trazodone 50 mg tablets 1-2 nightly as needed insomnia Has started nortriptyline 75 mg nightly for about 3 weeks and DT level 176, reduced to 50 mg HS early November. pramipexole increased to 0.75 mg twice daily off label for treatment resistant depression  He has noticed some increase in depression due to the length of time since the last Spravato administration.  She believes Spravato is helping her.  sHe is not suicidal but has felt very blue the last few days. She is tolerating the medication. She is ambivalent about Spravato versus TMS but she is considering TMS. She wants to continue Spravato administration because it is clearly helpful.  11/19/22 appt noted: Received Spravato 84 mg today as scheduled.  Tolerated it well without nausea or vomiting headache or  chest pain or palpitations.  Expected dissociation gradually resolved over the 2 hour observation period. She feels 50% less depressed with Spravto and wants to continue it.   Continues meds Adderall XR 20 mg every morning, Wellbutrin XL 450 every morning, clonidine 0.2 mg twice daily, lorazepam 1 mg every 8 hours as needed.  Trazodone 50 mg tablets 1-2 nightly as needed insomnia Has started nortriptyline 75 mg nightly for about 3 weeks and DT level 176, reduced to 50 mg HS early November. pramipexole increased to 0.75 mg twice daily off label for treatment resistant depression  She has felt better back on Spravato more regularly.  However she still has residual depression esp when alone or when wihtout activity.  Can function when needed.   Does not have her connfidence back. She plans to pursue TMS availability. Have discussed retrying Auvelity  11/28/22 appt noted: Received Spravato 84 mg today as scheduled.  Tolerated it well without nausea or vomiting headache or chest pain or palpitations.  Expected dissociation gradually resolved over the 2 hour observation period. She feels 50% less depressed with Spravto and wants to  continue it.   Continues meds Adderall XR 20 mg every morning, Wellbutrin XL 450 every morning, clonidine 0.2 mg twice daily, lorazepam 1 mg every 8 hours as needed.  Trazodone 50 mg tablets 1-2 nightly as needed insomnia Has started nortriptyline 75 mg nightly for about 3 weeks and DT level 176, reduced to 50 mg HS early November. pramipexole increased to 0.75 mg twice daily off label for treatment resistant depression  Last week she was more depressed than usual for reasons that are not clear.  She has not [facility] a clear [facility] [employer] [facility].  States that they have not returned her call.  She is asked about Auvelity retry in place of Wellbutrin.  Has still been walking.  1/22/2Received Spravato 84 mg today as scheduled.  Tolerated it well without nausea or  vomiting headache or chest pain or palpitations.  Expected dissociation gradually resolved over the 2 hour observation period. She feels 50% less depressed with Spravto and wants to continue it.   Continues meds Adderall XR 20 mg every morning, Wellbutrin XL 450 every morning, clonidine 0.2 mg twice daily, lorazepam 1 mg every 8 hours as needed.  Trazodone 50 mg tablets 1-2 nightly as needed insomnia Has started nortriptyline 75 mg nightly for about 3 weeks and DT level 176, reduced to 50 mg HS early November. pramipexole increased to 0.75 mg twice daily off label for treatment resistant depression 4 appt noted: No SE .  Satisfied with meds. Depression is better in the last week.  Not sure why that is the case.  Still walking daily and that helps and journaling 3 pages daily.  Working on Biomedical scientistconfidence.  No changes desire.  Clearly benefits from Spravato but not 100%.  Still considering TMS.re   ECT-MADRS    Flowsheet Row [facility]  MADRS Total Score 29 15 27  46        Past Psychiatric Medication Trials: fluoxetine, duloxetine, Viibryd, lamotrigine, Pristiq, sertraline, citalopram,  Trintellix anxious and SI Wellbutrin XL 450 Auvelity 1 dose  Adderall, Adderall XR, Vyvanse, Ritalin, Strattera low dose NR Lorazepam Trazodone  Depakote,  lamotrigine cog complaints Lithium remotely Abilify 7.5  Vraylar 1.5 mg daily agitation and insomnia Rexulti insomnia Latuda 40 one dose, CO anxious and SI Seroquel XR 300 Olanzapine 10  At visit November 12, 2019. We discussed Patient developed an increasingly severe alcohol dependence problem since her last visit in January.  She went to [facility] and has had  no alcohol since then except 1 day.  She never abused stimulants but they took her off the stimulants at [facility].  Her ADD was markedly worse.  The Wellbutrin did not help the ADD.   D history lamotrigine rash at 67 yo  Review of Systems:  Review of Systems  Constitutional:  Positive for fatigue.  Cardiovascular:  Negative for palpitations.  Musculoskeletal:  Positive for back pain. Negative for arthralgias and joint swelling.       SP hip surgery October 2020  Neurological:  Negative for tremors.  Psychiatric/Behavioral:  Positive for decreased concentration and dysphoric mood. Negative for agitation, behavioral problems, confusion, hallucinations, self-injury, sleep disturbance and suicidal ideas. The patient is nervous/anxious. The patient is not hyperactive.     Medications: I have reviewed  the patient's current medications.  Current Outpatient Medications  Medication Sig Dispense Refill   amLODipine (NORVASC) 2.5 MG tablet Take 2.5 mg by mouth daily.     amphetamine-dextroamphetamine (ADDERALL XR) 20 MG 24 hr capsule Take 1 capsule (20 mg total) by mouth every morning. 30 capsule 0   amphetamine-dextroamphetamine (ADDERALL XR) 20 MG 24 hr capsule Take 1 capsule (20 mg total) by mouth every morning. 30 capsule 0   buPROPion (WELLBUTRIN XL) 150 MG 24 hr tablet TAKE 3 TABLETS BY MOUTH DAILY 270 tablet 1   cloNIDine (CATAPRES) 0.2 MG tablet Take 1 tablet (0.2 mg total) by mouth 2 (two) times daily. 60 tablet 2   Esketamine HCl, 84 MG Dose, (SPRAVATO, 84 MG DOSE,) 28 MG/DEVICE SOPK USE 3 SPRAYS IN EACH NOSTRIL ONCE A WEEK 3 each 0   iron polysaccharides (NIFEREX) 150 MG capsule TAKE 1 CAPSULE BY MOUTH EVERY DAY 90 capsule 1   LORazepam (ATIVAN) 1 MG tablet Take 1 tablet (1 mg total) by mouth every 8 (eight) hours as needed. for anxiety 90 tablet 0   losartan (COZAAR) 50 MG tablet Take 50 mg by mouth daily.     nebivolol (BYSTOLIC) 2.5 MG tablet Take 2.5 mg by mouth daily.      nortriptyline (PAMELOR) 25 MG capsule Take 2 capsules (50 mg total) by mouth at bedtime. 180 capsule 0   pramipexole (MIRAPEX) 0.5 MG tablet Take 1 tablet (0.5 mg total) by mouth 3 (three) times daily. 90 tablet 0   traZODone (DESYREL) 50 MG tablet TAKE 1-2 TABLETS BY MOUTH NIGHTLY AS NEEDED FOR SLEEP 180 tablet 1   No current facility-administered medications for this visit.    Medication Side Effects: None  Allergies:  Allergies  Allergen Reactions   Metronidazole Shortness Of Breath and Other (See Comments)    Heart pounding   Ferrlecit [Na Ferric Gluc Cplx In Sucrose] Other (See Comments)    Infusion reaction 05/12/2019    Past Medical History:  Diagnosis Date   ADHD    Anemia    Anxiety    Arthritis    Depression    Heart murmur    i went to see a cardiologit slast eyar  and i had zero plaque,    PONV (postoperative nausea and vomiting)    Recovering alcoholic in remission (HCC)     Family History  Problem Relation Age of Onset   Atrial fibrillation Mother    CAD Father     Past Medical History, Surgical history, Social history, and Family history were reviewed and updated as appropriate.   Please see review of systems for further details on the patient's review from today.   Objective:   Physical Exam:  There were no vitals taken for this visit.  Physical Exam Constitutional:      General: She is not in acute distress. Neurological:     Mental Status: She is alert and oriented to person, place, and time.     Coordination: Coordination normal.     Gait: Gait normal.  Psychiatric:        Attention and Perception: Attention and perception normal.        Mood and Affect: Mood is anxious and depressed. Affect is not labile or tearful.        Speech: Speech is not rapid and pressured, slurred or tangential.        Behavior: Behavior is not slowed.        Thought Content: Thought content is not  paranoid or delusional. Thought content does not include homicidal  or suicidal ideation. Thought content does not include suicidal plan.        Cognition and Memory: Cognition normal. Memory is not impaired. She does not exhibit impaired recent memory.        Judgment: Judgment normal.     Comments: Insight intact. No auditory or visual hallucinations. No delusions.  Residual depression and anxiety about 60% better with Spravato but last week a little worse. Affect better with more spontaneity  now vs before Spravato.  less blunted  No Sui intent plan      Lab Review:     Component Value Date/Time   NA 137 01/12/2021 1430   NA 140 11/18/2018 1544   K 3.8 01/12/2021 1430   CL 108 01/12/2021 1430   CO2 22 01/12/2021 1430   GLUCOSE 94 01/12/2021 1430   BUN 14 01/12/2021 1430   BUN 20 11/18/2018 1544   CREATININE 0.82 01/12/2021 1430   CALCIUM 8.9 01/12/2021 1430   PROT 6.6 01/12/2021 1430   ALBUMIN 3.9 01/12/2021 1430   AST 12 (L) 01/12/2021 1430   ALT 11 01/12/2021 1430   ALKPHOS 46 01/12/2021 1430   BILITOT 0.5 01/12/2021 1430   GFRNONAA >60 01/12/2021 1430   GFRAA >60 09/02/2019 0249   GFRAA >60 01/27/2019 0811       Component Value Date/Time   WBC 4.5 01/12/2021 1430   RBC 4.32 01/12/2021 1430   HGB 12.8 01/12/2021 1430   HGB 12.9 07/17/2019 0953   HCT 38.5 01/12/2021 1430   HCT 21.9 (L) 12/25/2018 1221   PLT 272 01/12/2021 1430   PLT 286 07/17/2019 0953   MCV 89.1 01/12/2021 1430   MCH 29.6 01/12/2021 1430   MCHC 33.2 01/12/2021 1430   RDW 12.4 01/12/2021 1430   LYMPHSABS 1.4 01/12/2021 1430   MONOABS 0.4 01/12/2021 1430   EOSABS 0.0 01/12/2021 1430   BASOSABS 0.0 01/12/2021 1430    No results found for: "POCLITH", "LITHIUM"   No results found for: "PHENYTOIN", "PHENOBARB", "VALPROATE", "CBMZ"   .res Assessment: Plan:    Recurrent major depression resistant to treatment [facility](HCC)  Attention deficit hyperactivity disorder (ADHD), predominantly inattentive type  Generalized anxiety disorder  Insomnia due to mental  condition  Accelerated hypertension   She has treatment resistant major depression ongoing with 60% better with Spravato. Have  discussed some of her  abnormal behaviors last year leading to this depressive episode getting worse which she says were associated with heavy use of delta 8 and not a manic episode.  She realizes now that that was not good for her.  She stopped all use of other drugs including those available over-the-counter such as delta 8 or any other THC related products.  She is no longer having any of those types of behaviors and instead is depressed.  She is experiencing more pleasure and is less blunted and enjoying more and better inteest  She is approximately 60% better.  She also feels worse if misses a dose of Spravato.  Patient was administered Spravato 84 mg intranasally today.  The patient experienced the typical dissociation which gradually resolved over the 2-hour period of observation.  There were no complications.  Specifically the patient did not have nausea or vomiting or headache.  Blood pressures monitored at the 40-minute and 2-hour follow-up intervals.  Borderline high.  By the time the 2-hour observation period was met the patient was alert and oriented and able to exit without  assistance.  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  See nursing note for further details.She wants to continue Spravato. We discussed discussed the side effects in detail as well as the protocol required to receive Spravato.   Failed multiple antidepressants.  Many of them were not actual failures but intolerances and it is unclear whether some of that was more connected with anxiety than true side effects.  1 example is the JordanLatuda.  In general she does not want to try anything but an antidepressant but has failed all major categories of antidepressants except TCAs and MAO inhibitors which have not  been tried until now. There is a consistent pattern of thinking she is worse with medication which is not consistent with objective evidence.    Her self assessment is clouded by her depression.  Started nortriptyline 75 mg nightly.  Serum level 176.  So Reduced to 50 mg HS  Discussed side effects in detail.  Needs more time to help. Extensive discussion previously about her ambivalence about meds and missatributing sx of depression as SE of meds.    continue Wellbutrin XL 450 mg AM Consider retrying Auvelity in place of Wellbutrin was discussed.  Consider Off label augmentation DM at a lower dose of 15 mg BID-defer longer  Recently increase pramipexole augmentation off label to 0.5 mg tID or 0.75 mg BID  Disc SE.  Looks like partial response with this. She wants to continue it.  Started Spravato 84 mg twice weekly on 03/16/2022.  Now on weekly administration  Adderall  XR 20 mg AM   Ativan 1 mg 3 times daily as needed anxiety but try to cut it back. Is not ideal to use benzodiazepine with stimulant but because of the severity of her symptoms it has been necessary.  Hope to eventually eliminate the benzodiazepine.  Expected as her depression improves her anxiety will improve as well.  However lately her anxiety has been unmanageable.  We will expect that to improve as the depression improves.  She has headed insturctions to reduce this.  Continue clonidine 0.2 mg BID off label for anxiety and helps BP partially. BP is better controlled but not consistent.  Consider increasing amlodipine.  Also on losartan 50  Discussed potential benefits, risks, and side effects of stimulants with patient to include increased heart rate, palpitations, insomnia, increased anxiety, increased irritability, or decreased appetite.  Instructed patient to contact office if experiencing any significant tolerability issues. She wants to return to usual dose of Adderall for ADD bc of mor poor cognitive function with  reduction.  Also discussed that depression will impair cognitive function.  TMS consultation was initially denied .  She says she called [facility] about this but they have not scheduled an appt .Marland Kitchen.   [facility].  We discussed the pros and cons of this versus Spravato.  For now she wants to continue Spravato because it is clearly helpful.  Extensive discussion about her chronic ambivalence about psychiatric medicines and tendency to blame medicine for the symptoms of depression that she had prior to even starting the medications.  After discussing this at length she was able to acknowledge that that is factual.  She does not appear to be having any significant side effects with the current medications.  However so far not much benefit with nortriptyline despite adequate level.  Has Maintained sobriety  FU with Spravato  weekly   Meredith Staggers, MD, DFAPA     Please see After Visit Summary for patient specific instructions.  Future Appointments  Date Time Provider Department Center  12/10/2022  9:30 AM Cottle, Steva Ready., MD [facility] None  12/10/2022  9:30 AM CP-NURSE [facility] None                 No orders of the defined types were placed in this encounter.      -------------------------------

## 2022-12-05 NOTE — Progress Notes (Signed)
NURSES NOTE:   Patient arrived for her 50th Spravato treatment. Pt is being treated for Treatment Resistant Depression, pt will be receiving 84 mg which will continue to be her maintenance dose, she receives weekly treatments.  Pt is also planning on [place] maybe. Patient arrived and taken to treatment room. Confirmed she had a ride home which is her husband would be coming back to pick up pt when done and sometimes she needs to use Mel[facility] Popper if he is unable to pick her up. Pt's Spravato is ordered through [employer] and delivered to office, all Spravato medication is stored at doctors office per REMS/FDA guidelines. The medication is required to be locked behind two doors per FDA/REMS Protocol. Medication is also disposed of properly per regulations.   Began taking patient's vital signs at 9:45 AM 121/95, pulse 80, Pulse Ox 99%, Gave patient first dose 28 mg nasal spray, each nasal spray administered in each nostril as directed and waited 5 minutes between the second and third dose. All 3 doses given pt did not complain of any nausea/vomiting, given a cup of water due to the taste after the administration of Spravato.  She listens to [employer] with spa or relaxing music.  Checked 40 minute vitals at 10:30 AM, 114/61, pulse 75, Pulse Ox 92%. Explained she would be monitored for a total time of 120 minutes. Discharge vitals were taken at 11:45 AM 114/83 P 72, 99% Pulse Ox. Dr. Clovis Pu met with pt today and discussed her medication. I walked pt to elevator, where her husband met her for the ride home. Recommend she go home and sleep or just relax on the couch. No driving, no intense activities. Verbalized understanding. Nurse was with pt a total of 70 minutes for clinical. Pt is scheduled next Monday, January 29th 2024. Pt instructed to call office with any problems or questions.      LOT 23MG [id number] EXP [id number]

## 2022-12-10 ENCOUNTER — Ambulatory Visit (INDEPENDENT_AMBULATORY_CARE_PROVIDER_SITE_OTHER): Payer: 59 | Admitting: Psychiatry

## 2022-12-10 ENCOUNTER — Ambulatory Visit: Payer: 59

## 2022-12-10 VITALS — BP 127/92 | HR 75

## 2022-12-10 DIAGNOSIS — F900 Attention-deficit hyperactivity disorder, predominantly inattentive type: Secondary | ICD-10-CM | POA: Diagnosis not present

## 2022-12-10 DIAGNOSIS — F411 Generalized anxiety disorder: Secondary | ICD-10-CM

## 2022-12-10 DIAGNOSIS — F5105 Insomnia due to other mental disorder: Secondary | ICD-10-CM | POA: Diagnosis not present

## 2022-12-10 DIAGNOSIS — F339 Major depressive disorder, recurrent, unspecified: Secondary | ICD-10-CM | POA: Diagnosis not present

## 2022-12-10 DIAGNOSIS — I10 Essential (primary) hypertension: Secondary | ICD-10-CM

## 2022-12-10 NOTE — Progress Notes (Unsigned)
Laura Chang [id number] June 03, 1956 66 y.o.  Subjective:   Patient ID:  Laura Chang is a 67 y.o. (DOB 06-22-1956) female.  Chief Complaint:  No chief complaint on file.     Laura Chang presents to the office today for follow-up of depression and anxiety and ADD.  seen November 12, 2019.  Melted down in 2020.  Went to [employer] in July.  No withdrawal.  1 drink since then.  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 She was on no stimulant and no SSRI but was taking Strattera and Wellbutrin.  The following changes were made. Stop Strattera. OK restart stimulant bc severe ADD Restart Adderall 1 daily for a few days and if tolerated then restart 1 twice daily. If not tolerated reduce the dosage if needed. May need to stop Wellbutrin if not tolerating the stimulant.  Yes.  DC Wellbutrin Restart Prozac 20 mg daily.  February 2021 appointment with the following noted: Completed grant proposal.  Couldn't doit without Adderall.  Sold a bunch of work.   Adderall XR lasts about 3 pm.  Strength seems about right.  BP been OK.  Not jittery.   Stopped Wellbutrin but had no SE. Mood drastically better with grant proposal and back on fluoxetine.  Less depressed and lethargic.  No anxiety.  Cut back on coffee. Started back with devotions and stronger faith. Plan: Continue Prozac 20 mg daily. May have to increase the dose at some point in the future given that she usually was taking higher dosages but she is getting good response at this time. Restart Wellbutrin off label for ADD since can't get 2 ADDERALL daily. 150 mg daily then 300 mg daily. She can adjust the dose between 150 mg and 300 mg daily to get the optimal effect.   05/11/2020 appointment with the following noted: Has been inconsistent with Prozac and Wellbutrin. Not sure of the effect of Wellbutrin. Biggest deterrent in work is anxiety.  Some of the work is conceptual and difficult at times.  Can  feel she's not up to a project at times.  Overall is OK but would like a steadier benefit from stimulant.  Exhausted from managing concentration and keeping up with things from the day.  Loses things.  Not good keeping up with schedule. Overall productive and emotionally OK. Can feel Adderall wear off. Mood is better in summer and worse in the winter.   F died in 28-Sep-2023 and that is a loss. No SE Wellbutrin. Still attends AA meetings.  Real benefit from [name] last year. Recognizes effect of anemia on ADD and mood.  Had iron infusions last winter. Plan:  Wellbutrin off label for ADD since can't get 2 ADDERALL daily. 150 mg daily then 300 mg daily.  01/24/2021 appointment with following noted: Doing a program called [employer] mindfulness app since Xmas.  CBT app helped the depression.  App helped her focus better.  Lost sign weight. Writing a lot. Before Xmas felt depressed and started negative thinking worse, self denigrating. Not drinking. More isolated.   Recognizes mo is narcissist.    Didn't tell anyone she was born until 3 mos later.  M aloof and uninterested in pt.  Lied about her birthday.  Mo lack of affection even with pt's kids. Going to [place] for a year and it helped her to quit drinking. Also misses kids being gone with a hole also. Plan: No med changes  05/04/2021 appointment with the following  noted: Therapist Laura Chang thinks she's manic. Lost weight to 144#.   States she is still sleeping okay.  Admits she is hyper and recognizes that she is likely manic.  She feels great, euphoric with an increased sense of spiritual connectedness to God.  She has racing thoughts and talks fast and talks a lot and this is noted by her husband.  He thinks she is a bit hyper.  She has been able to maintain sobriety although she will have 1 glass of wine on special occasions but does not drink by herself.  She is not drinking to excess.  She denies any dangerous impulsivity.  She is  clearly not depressed and not particularly anxious.  She has no concerns about her medication and she has been compliant.  06/16/21 appt noted: So much better.  Going through a lot but the manic thing happened on top of it.  So much slower.  Didn't feel like losing anything with risperidone.  Likes the Adderalll at 10 mg. Some drowsiness in the AM and very drowsy from risperidone 2 mg HS. Prayer life is better. Handling stress better. Less depressed with risperidone. Still likes trazodone. Sleeps well. Plan: Reduce Prozac to 10 mg daily.  Consider stopping it because it can feel the mania however she is reluctant to do that because she fears relapse of depression. Reduce risperidone to 1.5 mg nightly due to side effects.  Discussed risk of worsening mania.  07/25/2021 appointment with the following noted: Misses the Adderall and hard to function without it. Depressed now. Heavy chest.  Anxious and guilty.  Body feels heavy.   Hates Wellbutrin.   Plan: Increase fluoxetine to 20 mg daily Add Abilify 1/2 of 15 mg tablet daily Wean wellbutrin by 1 tablet each week  bc she feels it is not helpful and DT polypharmacy Reduce risperidone to 1 daily for 1 week and stop it. Disc risk of mania. Increase Adderall to XR 20 mg AM  08/08/21 Much less depressed and starting to feel normal I feel a lot better. No SE.  Speech normal off risperidone. Sleeping OK on trazaodone and enough.   Noticed benefit from Adderall again. Plan: continue fluoxetine to 20 mg daily Continue Abilify 1/2 of 15 mg tablet daily for depression and mania continue Adderall to XR 20 mg AM  10/10/2021 phone call: Pt stated she feels like the Abilify should be decreased to 25m.She said she is depressed but rational and not suicidal.She has an appt Monday and can wait until then if you prefer. MD response: Reduce the Abilify to 7.5 mg every other day.  We will meet on 10/16/2021 and decide what to do from there.  10/16/2021  appointment with the following noted: More depressed.  Most depressed I've ever been.  Just numb.  Sense of grief.   Thinks the manic episode was unlike anything else she ever had.  Doesn't want to medicate against it.  Don't enjoy people.  Easily overwhelmed.  Had some death thoughts but not suicidal.  Has been functional.  Feels better today after reducing Abilify to every other day but she is only been doing that for 3 days. A/P: Episode of post manic depression was explained. continue fluoxetine to 20 mg daily Hold Abilify for 1 week then resume Abilify 1/2 of 15 mg tablet every other day for depression and mania continue Adderall to XR 20 mg AM  10/27/2021 appointment with the following noted: I'm doing so much better.  Handling the depression  better. Better self talk and spiritual focus has helped.   Dep 6/10 manifesting as anxiety with low confidence.   F died 2  years ago and M 67 yo and is dependent . She is working hard to feel better but still feels depressed.  She almost feels like she has a little more anxiety since restarting Abilify every other day. Plan: continue fluoxetine to 20 mg daily DC Abilify .  Vrayalar 1.5 mg QOD to try to get rid of depression ASAP. continue Adderall to XR 20 mg AM  11/10/2021 appointment with the following noted: Busy with Xmas and it was fun with family but then a big let down.  Did well with it.  Functioned well with it.  Working hard on things with depression.  Not shutting down. Not sure but feels better today but yesterday was hard.  Difficulty dealing with mother.  She won't do anything to help herself.  Yesterday with her all day.  Won't do PT and has isolated herself.    Lack of confidence.   No SE with Vraylar.  11/24/21 urgent appointment appt noted: More and more depressed.   So anxious and doesn't want to be alone but can do so. No appetite. Hurts inside. Has had some fleeting suicidal thoughts but would not act on them.  Tolerating  meds. Has been consistent with Vraylar 1.5 mg every other day, fluoxetine 20 mg daily Plan: Increase Vraylar to 1.5 mg daily Change Prozac to Trintellix 10 mg daily. Discussed side effects of each continue Adderall to XR 20 mg AM  12/27/2021 appointment with the following noted: Not OK.  I feel less depressed but feels bat shit. Not sleeping well.  Extremely anxious. Off and on sleep. 3-4 hours of sleep.   Still having daily SI.  But also become obvious has so much to do.  Overwhelmed by tasks.   Needs anxiety meds to just function. Not more motivated.  Walked yesterday.   Feels afraid like in trouble but not irritable or angry. DC DT agitation Vraylar to 1.5 mg daily Change Prozac to Trintellix 10 mg daily. Hold Adderall to XR 20 mg AM Clonidine 0.1 1/2 tablet twice daily for 2 days and if needed for anxiety and sleep increase to 1 twice daily Ok temporary Ativan 1 mg 3 times daily as needed anxiety  01/05/22 appt noted: Off fluoxetine and  Trintellix.  Only on Ativan, trazodone and Adderall XR 20 plus added clonidine 0.1 mg BID Didn't think she needed to start Trintellix. Not taking Ativan.   Didn't like herself last week. Feels some better today. Wonders if the manic sx Not agitated.  Anxiety kind of calmed down.  A lot to be anxious about situationally.  $ stress. Concerns about downers with meds. Can't access normal personality. ? Lethargy and inability to talk as sE. Plan: Latuda 20-40 mg daily with food. Adderall to XR 20 mg AM Clonidine 0.1 1/2 tablet twice daily  reduce dose to be sure no SE Ok temporary Ativan 1 mg 3 times daily as needed anxiety  01/19/22 appt noted: Taking Latuda 20 mg daily.  Took 40 mg once and felt anxious and  SI Still depressed and not very reactive Anxiety mainly about the depression and fears of the future. She wants to revisit manic sx and thinks it was maybe bc taking delta 8 bc was taking a lot of it so still doesn't think she's classic  bipolar. She wants to only take Prozac bc thinks [place]  is perpetuating depression. Says the delta 8 was very psychaedelic.  When not taking it was not manic.  Sleeping ok again.  Plan: Per her request DC Latuda 20-40 mg daily with food. She wants to continue Prozac alone AMA  Adderall to XR 20 mg AM Clonidine 0.1 1/2 tablet twice daily  reduce dose to be sure no SE Ok temporary Ativan 1 mg 3 times daily as needed anxiety  01/23/2022 phone call complaining of increased anxiety since stopping Latuda.  She will try increasing clonidine.  01/26/2022 phone call not feeling well and wanted to restart the Vraylar.  However notes indicate that had made her agitated therefore she was encouraged to pick up samples of Rexulti 1 mg and start that instead.  02/06/2022 phone call: Stating she felt the Rexulti was helping with depression but she was not sleeping well and obsessing over things.  She was encouraged to increase Rexulti to 2 mg daily and increase trazodone for sleep.  02/09/2022 appointment with the following noted: This was an urgent work in appointment No sleep last night with trazodone 100 mg HS Nothing really better depression or anxiety. Ruminating negative anxious thoughts. Did not tolerate Rexulti because it was causing insomnia.  Does not think it helped depression.  Lacks emotion that she should have.  Lacks her usual personality.  Some hopeless thoughts.  Some death thoughts.  Some suicidal thoughts without plan or intent Plan: DC Rexulti and Prozac & DC trazodone Adderall to XR 20 mg AM Clonidine 0.1 1/tablet twice daily  reduce dose to be sure no SE Ok temporary Ativan 1 mg 3 times daily as needed anxiety Start Seroquel XR 150 mg nightly  03/02/2022 appointment: Langley Gauss called back a few days after starting Seroquel stating it was making her more anxious and more depressed.  This seemed unlikely as this medicine rarely ever causes anxiety.  She stopped the medication waited 3 days  and called back still had anxiety and depression but thought perhaps the anxiety was a little better.  She did not want to take the Seroquel. She knew about the option of Spravato and wanted to pursue that. Now questions whether to return to Seroquel while waiting to start Spravato bc feels just as bad without it and knows she didn't give it enough time to work.   MADRS 46  ECT-MADRS    Flowsheet Row Clinical Support from 08/06/2022 in [place] from 07/04/2022 in [place] from 05/21/2022 in [place] Office Visit from 03/02/2022 in [facility]  MADRS Total Score 29 15 27 $ 46      03/14/22 appt noted: Pt received Spravato 56 mg first dose today with some dissociative sx which were not severe.  She was anxious prior to the administration and felt better after receiving lorazepam 1 mg.  No NV, or HA. Wants to continue Spravato. Ongoing depression and desperate to feel better.  I'm not myself DT deprsssion which is most severe in recent history.  Anhedonia.  Low motivation.  Social avoidance. Continues to think all recent med trials are making her worse.  Sleep ok with Seroquel.  03/16/22 appt noted: Received Spravato 84 mg for the first time.  some dissociative sx which were not severe.  She was anxious prior to the administration and felt better after receiving lorazepam 1 mg.  No NV, or HA. Wants to continue Spravato.   Does not feel any  better or different since the last appt.  Ongoing depression.  Ongoing depression and desperate to feel better.  I'm not myself DT deprsssion which is most severe in recent history.  Anhedonia.  Low motivation.  Social avoidance. Continues to think all recent med trials are making her worse.  Sleep ok with Seroquel.  Does not want to continue Seroquel for TRD.  03/20/2022 appointment noted: Came for Spravato  administration today.  However blood pressure was significantly elevated approximately 180/115.  She was given lorazepam 1 mg and clonidine 0.2 mg to try to get it down. She states she regretted stopping the Seroquel XR 300 mg tablets.  She now realizes it was helpful.  She did not sleep much at all last night.  She did not take the Adderall this morning. 2 to 3 hours after arrival blood pressure was still elevated at  170/110, 62 pulse.  For Spravato administration was canceled for today.  She admits to being anxious and depressed.  She is not suicidal.  She is highly motivated to receive the Spravato.  We discussed getting it tomorrow.  03/22/2022 appointment noted: Patient's blood pressure was never stable enough yesterday in order to get her in for Spravato administration.  She was encouraged to see her primary care doctor.  It is better today.  03/26/2022 appointment with the following noted: Blood pressure was better.  Saw her primary care doctor who started on oral Bystolic 2.5 mg daily. Received Spravato 84 mg today as scheduled.  Tolerated it well without nausea or vomiting headache or chest pain or palpitations.  Her blood pressure was borderline but manageable. She remains depressed and anxious.  She is ambivalent about the medicine and desperate to get to feel better.  Continues to have anhedonia and low energy and low motivation and reduced ability to do things.  Less social.  Not suicidal.  03/28/22 appt noted: Received Spravato 84 mg today as scheduled.  Tolerated it well without nausea or vomiting headache or chest pain or palpitations.  Her blood pressure was borderline but manageable. Has not seen any improvement so far.  Tolerating Seroquel.  Inconsistent with Bystolic and BP has been borderline high. Still depressed and anxious and anhedonia.  Low motivation, energy, productivity. Taking quetiapine and tolerating XR 300 mg nightly.  04/04/22 appt noted: Received Spravato 84 mg  today as scheduled.  Tolerated it well without nausea or vomiting headache or chest pain or palpitations.  Her blood pressure was borderline but manageable. Has not seen any improvement so far.  Tolerating Seroquel.   She still tends to think that the medications are making her worse.  She has said this about each of the recent psychiatric medicines including Seroquel.  However her husband thinks she is improved.  She also admits there is some improvement in productivity.  She still feels highly anxious.  She still does not enjoy things as normal.  She still feels desperate to improve as soon as possible. Has been taking Seroquel XR since 03/20/2022  04/10/22 appt noted: Received Spravato 84 mg today as scheduled.  Tolerated it well without nausea or vomiting headache or chest pain or palpitations.  Her blood pressure was borderline but manageable. Has not seen any improvement so far.  Tolerating Seroquel.  Doesn't like Seroquel bc she thinks it flattens here. 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  Tolerated  it well without nausea or vomiting headache or chest pain or palpitations.  Her blood pressure was borderline but manageable. Has not seen any improvement so far.  Tolerating Seroquel.  Doesn't like Seroquel bc she thinks it flattens her. Received Spravato 84 mg today as scheduled.  Tolerated it well without nausea or vomiting headache or chest pain or palpitations.  Her blood pressure was borderline but manageable. Has not seen any improvement so far.  Tolerating Seroquel.  Doesn't like Seroquel bc she thinks it flattens here.  We discussed her ambivalence about it. She is starting Auvelity and has tolerated it the last 2 days without side effect.  She still does not feel like herself and feels flat and not enjoying things  with suppressed expressed emotion  04/17/2022 appointment with the following noted: Received Spravato 84 mg today as scheduled.  Tolerated it well without nausea or vomiting headache or chest pain or palpitations.  Her blood pressure was borderline but manageable. Has not seen any improvement so far.  Tolerating Seroquel.  Doesn't like Seroquel bc she thinks it flattens her. She has been tolerating the Auvelity 1 in the morning without side effects for about a week.  She has not noticed significant improvement so far.  She still feels depressed and flat and not herself.  Other people notice that she is flat emotionally.  She is not suicidal.  She does feel discouraged that she is not getting better yet.  04/19/2022 appointment noted: Has increased Auvelity to 1 twice daily for 2 days, continues quetiapine XR 300 mg nightly, clonidine 0.3 mg twice daily, lorazepam 1 mg twice daily for anxiety and Adderall XR 20 mg in the morning. No obious SE but she still thinks quetiapine XR is making her feel down.  But not sedated Received Spravato 84 mg today as scheduled.  Tolerated it well without nausea or vomiting headache or chest pain or palpitations.  Her blood pressure was borderline but manageable. She still feels quite anxious and feels it necessary to take both the clonidine and lorazepam twice a day to manage her anxiety.  She has been consistently down and flat and not herself until yesterday afternoon she noted an improvement in mood and feeling much more like herself with her normal personality reemerging.  She was quite depressed in the morning with very dark negative thoughts.  She did not have those dark negative thoughts this morning.  She had a lot of questions about medication and when she was expecting to be improved and why she has not shown improvement up to now.  04/23/22 appt noted: Has increased Auvelity to 1 twice daily for 1 week, continues quetiapine XR 300 mg nightly, clonidine 0.3 mg  twice daily, lorazepam 1 mg twice daily for anxiety and Adderall XR 20 mg in the morning. No obious SE but she still thinks quetiapine XR is making her feel down.  But not sedated Received Spravato 84 mg today as scheduled.  Tolerated it well without nausea or vomiting headache or chest pain or palpitations.  She is still depressed but admits better function and is able to enjoy social interactions. Tolerating meds.  Would like to feel better for sure. Not herself.  Flat. Plan increase Auvelity to 1 tab BID as planned and reduce Quetiapine to 1/2 of ER 300 mg  bc NR for depression.  04/25/2022 appointment with the following noted: clonidine 0.3 mg twice daily, lorazepam 1 mg twice daily for anxiety and Adderall XR 20 mg in the  morning. Seroquel XR 300 HS No obious SE but she still thinks quetiapine XR is making her feel down.  But not sedated Received Spravato 84 mg today as scheduled.  Tolerated it well without nausea or vomiting headache or chest pain or palpitations.  Called yesterday with more anxiety.  Had increased Auvelity for 1 day and reduced Seroquel XR for 1 day.  Felt restless and fearful  05/01/2022 appointment noted: clonidine 0.3 mg twice daily, lorazepam 1 mg twice daily for anxiety and Adderall XR 20 mg in the morning. Seroquel XR 150 HS, Auvelity 1 BID Received Spravato 84 mg today as scheduled.  Tolerated it well without nausea or vomiting headache or chest pain or palpitations.  Nurse has noted patient has called multiple times sometimes asking the same question repeatedly.  It is unclear whether she is truly forgetful or is just anxious seeking reassurance. Patient acknowledges ongoing depression as well as some anxiety but states she has felt a little better in the last couple of days.  She has reduced the Seroquel to 150 mg at night and has increased Auvelity to 1 twice daily but only for 1 day.  So far she seems to be tolerating it.  05/03/22 appt noted: clonidine 0.2 mg  twice daily, lorazepam 1 mg twice daily for anxiety and Adderall XR 20 mg in the morning. Seroquel XR 150 HS, Auvelity 1 BID BP high this am about 170/100 and received extra clonidine 0.2 mg and came to receive Spravato.  Not dizzy, no SOB, nor CP but BP is still high Could not receive Spravato today bc BP high and pulse low at 30 ppm. Still depressed and anxious. Plan: continue trial Auvelity with Spravato She needs to get BP and pulse managed  05/08/22 TC: RTC  H Michael NA and mailbox full.  Could not leave message.  Pt  -  talked to she and H on speaker. H worried over wife.  Vacant stare.  Slurs words at times.  Not smiling. Reduced enjoyment.  Depression.  Withdrawn from usual activities.  Some irritability.  Anxious. Disc her concerns meds are making her worse.  Extensive discussion about her treatment resistant status.  There is a consistent pattern of not taking the medicines long enough to get benefit because she believes the meds are making her worse.  However the symptoms she describes as side effects are exactly the same symptoms that she had prior to taking the medication RX for  the depression.  So it is not clear that these are actual side effects. This is true about the 2 most recent meds including Seroquel and Auvelity.  Recommend psychiatric consultation in hopes of improving her comfort level with taking prescribed medications for a sufficient length of time to provide benefit. Extensive discussion about ECT is the treatment of choice for treatment resistant depression.  Spravato may work if she can comply with consistency.  There are medication options but they take longer to work.   Plan:  Reduce clonidine to 0.1 mg BID DT bradycardia.  Talk with PCP about BP and low pulse problems which are interfering with her consistent compliance with Spravato.   Limit lorazepam to 3 -4  mg daily max. Excess use is the cause of slurring speech.  She must stop excess use or will have to stop  the med. Stop Auvelity per her request.  But she has only been on the full dose for a little over a week and clearly has not had time to  get benefit from it.  She thinks maybe it is making her more anxious. Reduce Seroquel from 150XR to 50 -100 mg at night IR.  She couldn't sleep when stopped it completely. Will not start new antidepressant until her SE issues are resolved or not. Get second psych opinion from Yehuda Budd MD or another psychiatrist.  H's sister is therapist in [place], MD, [facility]  05/16/2022 appointment with the following noted: Received Spravato 84 mg today as scheduled.  Tolerated it well without nausea or vomiting headache or chest pain or palpitations.  She stopped Auvelity as discussed last week. On her own, without physician input, she restarted Wellbutrin XL 450 mg every morning today.  She had taken it in the past.  She feels jittery and anxious. She feels less depressed than she did last week.  But she is still depressed without her usual range of affect.  She still is less social and less motivated than normal. Her primary care doctor increased the dose of losartan Plan: Stop Seroquel Reduce Wellbutrin XL to 300 mg every morning.  Starting the dose at 450 every morning is likely causing side effects of jitteriness and it should not be started at that have a dosage. Recommend she not change meds on her own without MDM put  05/23/2022 appointment with the following noted: Received Spravato 84 mg today as scheduled.  Tolerated it well without nausea or vomiting headache or chest pain or palpitations.  Has not dropped seroquel XR 300 mg 1/2 tablet nightly bc couldn't sleep without it. Has not tried lower dose quetiapine 50 mg HS Still feels depressed.   BP is better managed so far, just saw PCP.  BP is better today and infact is low today. Dropped clonidine as directed from 0.3 mg BID bc inadequate control of BP to 0.2 mg BID.  However she wants to increase it  back to 0.3 mg twice daily because she feels it helped her anxiety better.  Wonders about increasing Wellbutrin for depression.  However she has only been on 300 mg a day for a week.  She was on 450 mg daily in the past.  06/06/22 appt noted: Received Spravato 84 mg today as scheduled.  Tolerated it well without nausea or vomiting headache or chest pain or palpitations.  She is still depressed and anxious.  She wants to try to stop the Seroquel but cannot sleep without some of it.  She is taking lorazepam 1 mg 4 times daily and still having a lot of anxiety.  She wants to increase clonidine back to 0.3 mg twice daily.  She hopes for more improvement She recently went for a second psychiatric opinion as suggested the results of that are pending.  06/11/22 appt noted: Received Spravato 84 mg today as scheduled.  Tolerated it well without nausea or vomiting headache or chest pain or palpitations.  She is still depressed and anxious. Without much change.  Still hopeless, anhedonia, reduced inteterest and motivation.  Tolerating meds. Disc concerns Spravato is not hleping much. Plan: stop Seroquel and start olanzapine 10 mg HS for TRD and anxiety.  06/13/2022 appointment noted: Received Spravato 84 mg today as scheduled.  Tolerated it well without nausea or vomiting headache or chest pain or palpitations.  She is still depressed and anxious. Without much change.  Still hopeless, anhedonia, reduced inteterest and motivation.  Tolerating meds. Disc concerns Spravato is not helping much as hoped but is improving a bit in the last week. Tolerating meds.  Continues Wellbutrin XL 450 AM, tolerating recently started olanzapine  10 mg HS. Sleep is good.   Pending appt with [facility] consult.  06/18/22 appt noted: Received Spravato 84 mg today as scheduled.  Tolerated it well without nausea or vomiting headache or chest pain or palpitations.  Tolerating meds. Continues Wellbutrin XL 450 AM, tolerating recently started  olanzapine  10 mg HS. Continues Adderall XR 20 amd and has tried to reduce lorazepam to 52m TID Sleep is good.   [place] [facility] [facility] [place] Depression is a little bit better in the last week with a little improvement in emotional expression and interest.  She is pushing herself to be more active.  Her daughter thought she was a little better than she has been.  However she is still depressed and still not her normal self with anhedonia and reduced emotional expressiveness.  06/20/22 appt noted: Received Spravato 84 mg today as scheduled.  Tolerated it well without nausea or vomiting headache or chest pain or palpitations.  Tolerating meds with a little sleepiness. Continues Wellbutrin XL 450 AM, tolerating recently started olanzapine  10 mg HS. Continues Adderall XR 20 amd and has tried to reduce lorazepam to 118mTID Sleep is good.   Mood is improving.  Better funciton.  Anxiety is better with olanzapine. Still not herself and depression not gone with some anhedonia and social avoidance and feeling overwhelmed.  8/14 2023 received Spravato 84 mg 06/27/2022 received Spravato Spravato 84 mg 07/02/2022 received Spravato 84 mg 07/04/2022 received Spravato 84 mg  07/09/2022 appointment noted: Received Spravato 84 mg today as scheduled.  Tolerated it well without nausea or vomiting headache or chest pain or palpitations.  Expected dissociation and feels less depressed with resolution of negative emotions immediately after Spravato and then depression, anxiety creep back in. Continues meds Adderall XR 20 mg every morning, Wellbutrin XL 450 every morning, clonidine 0.1 mg twice daily, lorazepam 1 mg every 6 hours as needed, olanzapine increased from 7.5 to 10 mg nightly on  Tolerating meds.  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  She does note some increased appetite and is  somewhat concerned about that but has not gained significant amounts of weight. She has had [place] [facility] which was initially denied but she knows it can be appealed.  However because she is improving with Spravato plus the other medications now she wants to continue the current treatment plan.  07/18/22 appt noted: Continues meds Adderall XR 20 mg every morning, Wellbutrin XL 450 every morning, clonidine 0.1 mg twice daily, lorazepam 1 mg every 6 hours as needed, olanzapine increased from 7.5 to 10 mg nightly on 07/04/2022. Received Spravato 84 mg today as scheduled.  Tolerated it well without nausea or vomiting headache or chest pain or palpitations.  Expected dissociation and feels less depressed with resolution of negative emotions immediately after Spravato and then depression, anxiety creep back in. Continues meds Adderall XR 20 mg every morning, Wellbutrin XL 450 every morning, clonidine 0.1 mg twice daily, lorazepam 1 mg every 6 hours as needed, olanzapine increased from 7.5 to 10 mg nightly on  Tolerating meds.  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  She does note some increased appetite and is somewhat concerned about that but has not gained  significant amounts of weight. She has had the [place] consultation which was initially denied but she knows it can be appealed. She continues to have chronic ambivalence about psychiatric medicines and initially tends to blame her depressive symptoms such as decreased concentration and feeling flat on what ever medicine she currently is taking even though she had the same symptoms before the current medicines were started.  Then after discussion she does admit that her depressive symptoms are improved since adding olanzapine but still has those residual symptoms noted.  07/23/22 received Spravato 84 mg   07/30/2022 appointment  noted: Received Spravato 84 mg today as scheduled.  Tolerated it well without nausea or vomiting headache or chest pain or palpitations.  Expected dissociation and feels less depressed with resolution of negative emotions immediately after Spravato and then depression, anxiety creep back in. Continues meds Adderall XR 20 mg every morning, Wellbutrin XL 450 every morning, clonidine 0.1 mg twice daily, lorazepam 1 mg every 6 hours as needed, olanzapine increased from 7.5 to 10 mg nightly on  She has been inconsistent with olanzapine because she continues to be ambivalent about the medications in general and thinks that perhaps the 10 mg is making her feel blunted.  She continues to feel some depression.  She had a good day this week and but still feels somewhat depressed and persistently anxious. Plan: be consistent with olanzapine 10 mg HS for TRD and longer trial for potential benefit for anxiety.  Has not taken it consistently.  08/06/2022 appointment noted: Received Spravato 84 mg today as scheduled.  Tolerated it well without nausea or vomiting headache or chest pain or palpitations.  Expected dissociation and feels less depressed with resolution of negative emotions immediately after Spravato and then depression, anxiety creep back in. Continues meds Adderall XR 20 mg every morning, Wellbutrin XL 450 every morning, clonidine 0.1 mg twice daily, lorazepam 1 mg every 6 hours as needed, olanzapine i 10 mg nightly  She continues to feel depressed but is about 50% better with Spravato.  She is still not herself.  She still has anhedonia.  She still is not her able to engage socially in the typical ways.  She is not jovial and outgoing like normal.  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  She continues to feel that her personality is dampened down.  There is a question about whether it is  related to depression or medication. Plan: continue olanzapine 10 for longer trial for TRD and severe anxiety.  08/13/22 appt noted:  Received Spravato 84 mg today as scheduled.  Tolerated it well without nausea or vomiting headache or chest pain or palpitations.  Expected dissociation and feels less depressed with resolution of negative emotions immediately after Spravato and then depression, anxiety creep back in. Continues meds Adderall XR 20 mg every morning, Wellbutrin XL 450 every morning, clonidine 0.1 mg twice daily, lorazepam 1 mg every 6 hours as needed, olanzapine i 10 mg nightly  She still does not feel herself.  Still struggles with depression and low motivation and reduced social engagement and reduced interest and reduced emotional expression.  She is somewhat better with the medicines plus Spravato.  She still believes the Spravato makes her blunted and is not sure how much it helps her anxiety.  She can have good days when her family is around and she is engaged.  She still wants to stop the olanzapine. She has apparently continued to take the trazodone despite having been told to stop it when she started olanzapine.  She feels like she needs the trazodone. Plan: DC olanzapine and Start nortriptyline 25 mg nightly and build up to 75 mg nightly and then check blood level.    08/27/2022 appointment noted: Received Spravato 84 mg today as scheduled.  Tolerated it well without nausea or vomiting headache or chest pain or palpitations.  Expected dissociation and feels less depressed with resolution of negative emotions immediately after Spravato and then depression, anxiety creep back in. Continues meds Adderall XR 20 mg every morning, Wellbutrin XL 450 every morning, clonidine 0.1 mg twice daily, lorazepam 1 mg every 6 hours as needed. Stopped olanzapine and started nortriptyline which she has taken for about a week is 75 mg nightly. So far she is tolerating the nortriptyline well with the  exception of some dry mouth and constipation which she is working to manage.  She does not feel substantially better better or different off the olanzapine.  No change in her sleep which is good.  Main concern currently in addition to the residual depression is anxiety which is somewhat situational with pending arch show.  She is worrying about it more than normal.  Says she is having to take lorazepam twice a day where she had been able to keep reduce it prior to this.  She still does not feel like herself with residual depression with less social interest and less of her usual buoyancy in personality.  She is flatter than normal.  Overall she still feels that the Spravato has been helpful at reducing the severity of the depression.  She is not suicidal. She has not heard anything about the [place] appeal as of yet.  09/05/2022 appointment noted: Received Spravato 84 mg today as scheduled.  Tolerated it well without nausea or vomiting headache or chest pain or palpitations.  Expected dissociation and feels less depressed with resolution of negative emotions immediately after Spravato and then depression, anxiety creep back in. Continues meds Adderall XR 20 mg every morning, Wellbutrin XL 450 every morning, clonidine 0.1 mg twice daily, lorazepam 1 mg every 6 hours as needed. Stopped olanzapine and started nortriptyline which she has taken for about 2 week is 75 mg nightly. Initially blood pressure was a little high causing delay in starting Spravato.  She admitted to feeling a little wound up.  She still experiences a little increase in depression if she goes longer than a week in between doses of Spravato.  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  09/10/22 appt noted: Received Spravato 84 mg today as scheduled.  Tolerated it well without nausea or vomiting headache or chest pain or palpitations.   Expected dissociation gradually resolved over the 2 hour observation period. She feels 50% less depressed with Spravto and wants to continue it.   Continues meds Adderall XR 20 mg every morning, Wellbutrin XL 450 every morning, clonidine 0.1 mg twice daily, lorazepam 1 mg every 6 hours as needed. Has started nortriptyline 75 mg nightly for about 3 weeks. Has not seen a significant difference with the addition of nortriptyline.  Tolerating it pretty well. She continues to have some degree of anhedonia and significant depression and anxiety.  Her daughters noticed that she is more needy and calls more frequently.  She acknowledges this as well.  She is clearly still not herself. Plan: pramipexole off label and RX 0.25 mg BID  09/17/2022 appointment noted: Received Spravato 84 mg today as scheduled.  Tolerated it well without nausea or vomiting headache or chest pain or palpitations.  Expected dissociation gradually resolved over the 2 hour observation period. She feels 50% less depressed with Spravto and wants to continue it.   Continues meds Adderall XR 20 mg every morning, Wellbutrin XL 450 every morning, clonidine 0.1 mg twice daily, lorazepam 1 mg every 6 hours as needed. Has started nortriptyline 75 mg nightly for about 3 weeks and DT level 176, reduced to 50 mg HS early November. Still the same sx as noted last visit.  Tolerating meds.   Compliant.  Still depressed and family notices.  Has been able to participate in family interactions.  Some post-show let down and has to do detailed work which is hard for her bc ADD.  Sleep and eating well.  Energy OK but not great.  No SI.  Not cried in a year or so.  Clearly less depressed and hopeless than before the Spravato.  09/24/22 appt noted: Received Spravato 84 mg today as scheduled.  Tolerated it well without nausea or vomiting headache or chest pain or palpitations.  Expected dissociation gradually resolved over the 2 hour observation period. She  feels 50% less depressed with Spravto and wants to continue it.   Continues meds Adderall XR 20 mg every morning, Wellbutrin XL 450 every morning, clonidine 0.1 mg twice daily, lorazepam 1 mg every 6 hours as needed. Has started nortriptyline 75 mg nightly for about 3 weeks and DT level 176, reduced to 50 mg HS early November. Still the same sx as noted last visit.  Tolerating meds.   Compliant.  Still depressed and family notices.  Has been able to participate in family interactions.  Some post-show let down and has to do detailed work which is hard for her bc ADD.  Sleep and eating well.  Energy OK but not great.  No SI.  Not cried in a year or so.  Clearly less depressed and hopeless than before the Spravato. Is not making further progress generally.  Stuck with moderate depression  10/02/22 appt noted: Received Spravato 84 mg today as scheduled.  Tolerated it well without nausea or vomiting headache or chest pain or palpitations.  Expected dissociation gradually resolved over the 2 hour observation period. She feels 50% less depressed with Spravto and wants to continue it.   Continues meds Adderall XR 20 mg every morning, Wellbutrin XL 450 every morning, clonidine 0.1 mg twice daily, lorazepam 1 mg every 6 hours as needed. Has started nortriptyline 75 mg nightly for about 3 weeks and DT level 176, reduced to 50 mg HS early November. Still the same sx as noted last visit.  Tolerating meds.   Compliant.  Still depressed and family notices.  Has been able to participate in family interactions.  Some post-show let down and has to do detailed work which is hard for her bc ADD.  Sleep and eating well.  Energy OK but not great.  No SI.  Not cried in a year or so.  Clearly less depressed and hopeless than before the Spravato. Is not making further progress generally.  Stuck with moderate depression.  Is able to function pretty normally. Plan: trial pramipexole 0.25 mg BID off label for depression.    10/08/22 appt noted: Received Spravato 84 mg today  as scheduled.  Tolerated it well without nausea or vomiting headache or chest pain or palpitations.  Expected dissociation gradually resolved over the 2 hour observation period. She feels 50% less depressed with Spravto and wants to continue it.   Continues meds Adderall XR 20 mg every morning, Wellbutrin XL 450 every morning, clonidine 0.1 mg twice daily, lorazepam 1 mg every 6 hours as needed. Has started nortriptyline 75 mg nightly for about 3 weeks and DT level 176, reduced to 50 mg HS early November. Still the same sx as noted last visit.  Tolerating meds.   Compliant.  Still depressed and family notices.  Has been able to participate in family interactions.  Some post-show let down and has to do detailed work which is hard for her bc ADD.  Sleep and eating well.  Energy OK but not great.  No SI.  Not cried in a year or so.  Clearly less depressed and hopeless than before the Spravato. Is not making further progress generally.  Stuck with moderate depression.  Behaved and felt pretty normally with family over for Thanksgiving. Doesn't see benefit or SE with pramipexole but thinks maybe it makes her worse. Plan:  increase pramipexole augmentation off label to 0.5 mg BID  10/15/2022 appointment noted: Received Spravato 84 mg today as scheduled.  Tolerated it well without nausea or vomiting headache or chest pain or palpitations.  Expected dissociation gradually resolved over the 2 hour observation period. She feels 50% less depressed with Spravto and wants to continue it.   Continues meds Adderall XR 20 mg every morning, Wellbutrin XL 450 every morning, clonidine 0.2 mg twice daily, lorazepam 1 mg every 6 hours as needed.  Trazodone 50 mg tablets 1-2 nightly as needed insomnia Has started nortriptyline 75 mg nightly for about 3 weeks and DT level 176, reduced to 50 mg HS early November. Recommended increase pramipexole 0.5 mg twice daily off  label for treatment resistant depression on 10/08/2022. She feels better motivated more active with pramipexole 0.5 mg twice daily.  She still is depressed but it is better.  We discussed the possibility of going up in the dose but did not change it.  10/22/2022 appointment noted: Received Spravato 84 mg today as scheduled.  Tolerated it well without nausea or vomiting headache or chest pain or palpitations.  Expected dissociation gradually resolved over the 2 hour observation period. She feels 50% less depressed with Spravto and wants to continue it.   Continues meds Adderall XR 20 mg every morning, Wellbutrin XL 450 every morning, clonidine 0.2 mg twice daily, lorazepam 1 mg every 8 hours as needed.  Trazodone 50 mg tablets 1-2 nightly as needed insomnia Has started nortriptyline 75 mg nightly for about 3 weeks and DT level 176, reduced to 50 mg HS early November. pramipexole 0.5 mg twice daily off label for treatment resistant depression on 10/08/2022. She is better motivated than she was.  She is journaling 3 pages a day.  She has started walking and has walked 5 days a week for 50 minutes for the last 2 weeks.  That is significantly helped her mood.  Her mood tends to be better when she interacts with family.  However she still has some periods of depression.  She is tolerating the medications well.  She still feels like her affect and confidence is not back to normal. Plan:  increase pramipexole augmentation off label to 0.5 mg tID or 0.75 mg BID   10/29/22 appt noted: Received Spravato  84 mg today as scheduled.  Tolerated it well without nausea or vomiting headache or chest pain or palpitations.  Expected dissociation gradually resolved over the 2 hour observation period. She feels 50% less depressed with Spravto and wants to continue it.   Continues meds Adderall XR 20 mg every morning, Wellbutrin XL 450 every morning, clonidine 0.2 mg twice daily, lorazepam 1 mg every 8 hours as needed.   Trazodone 50 mg tablets 1-2 nightly as needed insomnia Has started nortriptyline 75 mg nightly for about 3 weeks and DT level 176, reduced to 50 mg HS early November. pramipexole increased to 0.75 mg twice daily off label for treatment resistant depression  She has been feeling somewhat better with the increase in pramipexole and is tolerating it well.  She still is easily overwhelmed.  Her affect and mood can improve now when around her family or doing something positive.  She has been able to be more productive. She is tolerating the current medications. She is still considering TMS as an alternative to Spravato.  11/15/2022 appointment noted: Received Spravato 84 mg today as scheduled.  Tolerated it well without nausea or vomiting headache or chest pain or palpitations.  Expected dissociation gradually resolved over the 2 hour observation period. She feels 50% less depressed with Spravto and wants to continue it.   Continues meds Adderall XR 20 mg every morning, Wellbutrin XL 450 every morning, clonidine 0.2 mg twice daily, lorazepam 1 mg every 8 hours as needed.  Trazodone 50 mg tablets 1-2 nightly as needed insomnia Has started nortriptyline 75 mg nightly for about 3 weeks and DT level 176, reduced to 50 mg HS early November. pramipexole increased to 0.75 mg twice daily off label for treatment resistant depression  He has noticed some increase in depression due to the length of time since the last Spravato administration.  She believes Spravato is helping her.  sHe is not suicidal but has felt very blue the last few days. She is tolerating the medication. She is ambivalent about Spravato versus [place] but she is considering [place]. She wants to continue Spravato administration because it is clearly helpful.  11/19/22 appt noted: Received Spravato 84 mg today as scheduled.  Tolerated it well without nausea or vomiting headache or chest pain or palpitations.  Expected dissociation gradually resolved over  the 2 hour observation period. She feels 50% less depressed with Spravto and wants to continue it.   Continues meds Adderall XR 20 mg every morning, Wellbutrin XL 450 every morning, clonidine 0.2 mg twice daily, lorazepam 1 mg every 8 hours as needed.  Trazodone 50 mg tablets 1-2 nightly as needed insomnia Has started nortriptyline 75 mg nightly for about 3 weeks and DT level 176, reduced to 50 mg HS early November. pramipexole increased to 0.75 mg twice daily off label for treatment resistant depression  She has felt better back on Spravato more regularly.  However she still has residual depression esp when alone or when wihtout activity.  Can function when needed.   Does not have her connfidence back. She plans to pursue [place] availability. Have discussed retrying [place]  11/28/22 appt noted: Received Spravato 84 mg today as scheduled.  Tolerated it well without nausea or vomiting headache or chest pain or palpitations.  Expected dissociation gradually resolved over the 2 hour observation period. She feels 50% less depressed with Spravto and wants to continue it.   Continues meds Adderall XR 20 mg every morning, Wellbutrin XL 450 every morning, clonidine 0.2  mg twice daily, lorazepam 1 mg every 8 hours as needed.  Trazodone 50 mg tablets 1-2 nightly as needed insomnia Has started nortriptyline 75 mg nightly for about 3 weeks and DT level 176, reduced to 50 mg HS early November. pramipexole increased to 0.75 mg twice daily off label for treatment resistant depression  Last week she was more depressed than usual for reasons that are not clear.  She has not had a clear answer from [facility] about [facility] options.  States that they have not returned her call.  She is asked about Auvelity retry in place of Wellbutrin.  Has still been walking.  1/22/2Received Spravato 84 mg today as scheduled.  Tolerated it well without nausea or vomiting headache or chest pain or palpitations.  Expected dissociation  gradually resolved over the 2 hour observation period. She feels 50% less depressed with Spravto and wants to continue it.   Continues meds Adderall XR 20 mg every morning, Wellbutrin XL 450 every morning, clonidine 0.2 mg twice daily, lorazepam 1 mg every 8 hours as needed.  Trazodone 50 mg tablets 1-2 nightly as needed insomnia Has started nortriptyline 75 mg nightly for about 3 weeks and DT level 176, reduced to 50 mg HS early November. pramipexole increased to 0.75 mg twice daily off label for treatment resistant depression 4 appt noted: No SE .  Satisfied with meds. Depression is better in the last week.  Not sure why that is the case.  Still walking daily and that helps and journaling 3 pages daily.  Working on [employer], academic.  No changes desire.  Clearly benefits from Spravato but not 100%.  Still considering [email or web address]   ECT-MADRS    Flowsheet Row Clinical Support from 08/06/2022 in [place] from 07/04/2022 in [place] from 05/21/2022 in [place] Office Visit from 03/02/2022 in [facility]  MADRS Total Score 29 15 27  46        Past Psychiatric Medication Trials: fluoxetine, duloxetine, Viibryd, lamotrigine, Pristiq, sertraline, citalopram,  Trintellix anxious and SI Wellbutrin XL 450 Auvelity 1 dose  Adderall, Adderall XR, Vyvanse, Ritalin, Strattera low dose NR Lorazepam Trazodone  Depakote,  lamotrigine cog complaints Lithium remotely Abilify 7.5  Vraylar 1.5 mg daily agitation and insomnia Rexulti insomnia Latuda 40 one dose, CO anxious and SI Seroquel XR 300 Olanzapine 10  At visit November 12, 2019. We discussed Patient developed an increasingly severe alcohol dependence problem since her last visit in January.  She went to [facility] and has had no alcohol since then except 1 day.  She never abused stimulants but  they took her off the stimulants at [facility].  Her ADD was markedly worse.  The Wellbutrin did not help the ADD.   D history lamotrigine rash at 67 yo  Review of Systems:  Review of Systems  Constitutional:  Positive for fatigue.  Cardiovascular:  Negative for palpitations.  Musculoskeletal:  Positive for back pain. Negative for arthralgias and joint swelling.       SP hip surgery October 2020  Neurological:  Negative for tremors.  Psychiatric/Behavioral:  Positive for decreased concentration and dysphoric mood. Negative for agitation, behavioral problems, confusion, hallucinations, self-injury, sleep disturbance and suicidal ideas. The patient is nervous/anxious. The patient is not hyperactive.     Medications: I have reviewed the patient's current medications.  Current Outpatient Medications  Medication Sig Dispense Refill   amLODipine (NORVASC) 2.5 MG  tablet Take 2.5 mg by mouth daily.     amphetamine-dextroamphetamine (ADDERALL XR) 20 MG 24 hr capsule Take 1 capsule (20 mg total) by mouth every morning. 30 capsule 0   amphetamine-dextroamphetamine (ADDERALL XR) 20 MG 24 hr capsule Take 1 capsule (20 mg total) by mouth every morning. 30 capsule 0   buPROPion (WELLBUTRIN XL) 150 MG 24 hr tablet TAKE 3 TABLETS BY MOUTH DAILY 270 tablet 1   cloNIDine (CATAPRES) 0.2 MG tablet Take 1 tablet (0.2 mg total) by mouth 2 (two) times daily. 60 tablet 2   Esketamine HCl, 84 MG Dose, (SPRAVATO, 84 MG DOSE,) 28 MG/DEVICE SOPK USE 3 SPRAYS IN EACH NOSTRIL ONCE A WEEK 3 each 0   iron polysaccharides (NIFEREX) 150 MG capsule TAKE 1 CAPSULE BY MOUTH EVERY DAY 90 capsule 1   LORazepam (ATIVAN) 1 MG tablet Take 1 tablet (1 mg total) by mouth every 8 (eight) hours as needed. for anxiety 90 tablet 0   losartan (COZAAR) 50 MG tablet Take 50 mg by mouth daily.     nebivolol (BYSTOLIC) 2.5 MG tablet Take 2.5 mg by mouth daily.     nortriptyline (PAMELOR) 25 MG capsule Take 2 capsules (50 mg total) by  mouth at bedtime. 180 capsule 0   pramipexole (MIRAPEX) 0.5 MG tablet Take 1 tablet (0.5 mg total) by mouth 3 (three) times daily. 90 tablet 0   traZODone (DESYREL) 50 MG tablet TAKE 1-2 TABLETS BY MOUTH NIGHTLY AS NEEDED FOR SLEEP 180 tablet 1   No current facility-administered medications for this visit.    Medication Side Effects: None  Allergies:  Allergies  Allergen Reactions   Metronidazole Shortness Of Breath and Other (See Comments)    Heart pounding   Ferrlecit [Na Ferric Gluc Cplx In Sucrose] Other (See Comments)    Infusion reaction 05/12/2019    Past Medical History:  Diagnosis Date   ADHD    Anemia    Anxiety    Arthritis    Depression    Heart murmur    i went to see a cardiologit slast eyar  and i had zero plaque,    PONV (postoperative nausea and vomiting)    Recovering alcoholic in remission (HCC)     Family History  Problem Relation Age of Onset   Atrial fibrillation Mother    CAD Father     Past Medical History, Surgical history, Social history, and Family history were reviewed and updated as appropriate.   Please see review of systems for further details on the patient's review from today.   Objective:   Physical Exam:  There were no vitals taken for this visit.  Physical Exam Constitutional:      General: She is not in acute distress. Neurological:     Mental Status: She is alert and oriented to person, place, and time.     Coordination: Coordination normal.     Gait: Gait normal.  Psychiatric:        Attention and Perception: Attention and perception normal.        Mood and Affect: Mood is anxious and depressed. Affect is not labile or tearful.        Speech: Speech is not rapid and pressured, slurred or tangential.        Behavior: Behavior is not slowed.        Thought Content: Thought content is not paranoid or delusional. Thought content does not include homicidal or suicidal ideation. Thought content does not include suicidal plan.  Cognition and Memory: Cognition normal. Memory is not impaired. She does not exhibit impaired recent memory.        Judgment: Judgment normal.     Comments: Insight intact. No auditory or visual hallucinations. No delusions.  Residual depression and anxiety about 60% better with Spravato but last week a little worse. Affect better with more spontaneity  now vs before Spravato.  less blunted  No Sui intent plan      Lab Review:     Component Value Date/Time   NA 137 01/12/2021 1430   NA 140 11/18/2018 1544   K 3.8 01/12/2021 1430   CL 108 01/12/2021 1430   CO2 22 01/12/2021 1430   GLUCOSE 94 01/12/2021 1430   BUN 14 01/12/2021 1430   BUN 20 11/18/2018 1544   CREATININE 0.82 01/12/2021 1430   CALCIUM 8.9 01/12/2021 1430   PROT 6.6 01/12/2021 1430   ALBUMIN 3.9 01/12/2021 1430   AST 12 (L) 01/12/2021 1430   ALT 11 01/12/2021 1430   ALKPHOS 46 01/12/2021 1430   BILITOT 0.5 01/12/2021 1430   GFRNONAA >60 01/12/2021 1430   GFRAA >60 09/02/2019 0249   GFRAA >60 01/27/2019 0811       Component Value Date/Time   WBC 4.5 01/12/2021 1430   RBC 4.32 01/12/2021 1430   HGB 12.8 01/12/2021 1430   HGB 12.9 07/17/2019 0953   HCT 38.5 01/12/2021 1430   HCT 21.9 (L) 12/25/2018 1221   PLT 272 01/12/2021 1430   PLT 286 07/17/2019 0953   MCV 89.1 01/12/2021 1430   MCH 29.6 01/12/2021 1430   MCHC 33.2 01/12/2021 1430   RDW 12.4 01/12/2021 1430   LYMPHSABS 1.4 01/12/2021 1430   MONOABS 0.4 01/12/2021 1430   EOSABS 0.0 01/12/2021 1430   BASOSABS 0.0 01/12/2021 1430    No results found for: "POCLITH", "LITHIUM"   No results found for: "PHENYTOIN", "PHENOBARB", "VALPROATE", "CBMZ"   .res Assessment: Plan:    No diagnosis found.   She has treatment resistant major depression ongoing with 60% better with Spravato. Have  discussed some of her  abnormal behaviors last year leading to this depressive episode getting worse which she says were associated with heavy use of delta 8  and not a manic episode.  She realizes now that that was not good for her.  She stopped all use of other drugs including those available over-the-counter such as delta 8 or any other THC related products.  She is no longer having any of those types of behaviors and instead is depressed.  She is experiencing more pleasure and is less blunted and enjoying more and better inteest  She is approximately 60% better.  She also feels worse if misses a dose of Spravato.  Patient was administered Spravato 84 mg intranasally today.  The patient experienced the typical dissociation which gradually resolved over the 2-hour period of observation.  There were no complications.  Specifically the patient did not have nausea or vomiting or headache.  Blood pressures monitored at the 40-minute and 2-hour follow-up intervals.  Borderline high.  By the time the 2-hour observation period was met the patient was alert and oriented and able to exit without assistance.  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  See nursing note for further details.She wants to continue Spravato. We discussed discussed the side effects in detail as well as the protocol required to receive Spravato.   Failed multiple  antidepressants.  Many of them were not actual failures but intolerances and it is unclear whether some of that was more connected with anxiety than true side effects.  1 example is the [place].  In general she does not want to try anything but an antidepressant but has failed all major categories of antidepressants except TCAs and MAO inhibitors which have not been tried until now. There is a consistent pattern of thinking she is worse with medication which is not consistent with objective evidence.    Her self assessment is clouded by her depression.  Started nortriptyline 75 mg nightly.  Serum level 176.  So Reduced to 50 mg HS  Discussed side  effects in detail.  Needs more time to help. Extensive discussion previously about her ambivalence about meds and missatributing sx of depression as SE of meds.    She agrees to [employer].  To improve tolerability and reduce risk of side effects, Stop Wellbutrin and start Auvelity 1 in the morning for 1 week then 1 twice daily  To improve potential tolerability DT polypharmacy: Reduce pramipexole 0.5 mg BID and reduce nortriptyline to 50 mg HS  Recently increase pramipexole augmentation off label to 0.5 mg tID or 0.75 mg BID but will reduce to 0.5 mg BID.  Disc SE.  Looks like partial response with this. She wants to continue it.  Started Spravato 84 mg twice weekly on 03/16/2022.  Now on weekly administration  Adderall  XR 20 mg AM   Ativan 1 mg 3 times daily as needed anxiety but try to cut it back. Is not ideal to use benzodiazepine with stimulant but because of the severity of her symptoms it has been necessary.  Hope to eventually eliminate the benzodiazepine.  Expected as her depression improves her anxiety will improve as well.  However lately her anxiety has been unmanageable.  We will expect that to improve as the depression improves.  She has headed insturctions to reduce this.  Continue clonidine 0.2 mg BID off label for anxiety and helps BP partially. BP is better controlled but not consistent.  Consider increasing amlodipine.  Also on losartan 50  Discussed potential benefits, risks, and side effects of stimulants with patient to include increased heart rate, palpitations, insomnia, increased anxiety, increased irritability, or decreased appetite.  Instructed patient to contact office if experiencing any significant tolerability issues. She wants to return to usual dose of Adderall for ADD bc of mor poor cognitive function with reduction.  Also discussed that depression will impair cognitive function.  TMS consultation was initially denied .  She says she called [facility]  about this but they have not scheduled an appt .Marland Kitchen   Consider consultation with [facility] if [facility] is not an option.  We discussed the pros and cons of this versus Spravato.  For now she wants to continue Spravato because it is clearly helpful.  Extensive discussion about her chronic ambivalence about psychiatric medicines and tendency to blame medicine for the symptoms of depression that she had prior to even starting the medications.  After discussing this at length she was able to acknowledge that that is factual.  She does not appear to be having any significant side effects with the current medications.  However so far not much benefit with nortriptyline despite adequate level.  Has Maintained sobriety  FU with Spravato weekly   Meredith Staggers, MD, DFAPA     Please see After Visit Summary for patient specific instructions.  No future appointments.  No orders of the defined types were placed in this encounter.      -------------------------------

## 2022-12-11 ENCOUNTER — Encounter: Payer: Self-pay | Admitting: Psychiatry

## 2022-12-11 NOTE — Progress Notes (Signed)
URSES NOTE:   Patient arrived for her [address] st  Spravato treatment. Pt is being treated for Treatment Resistant Depression, pt will be receiving 84 mg which will continue to be her maintenance dose, she receives weekly treatments.  Pt is also planning on [place] maybe. Patient arrived and taken to treatment room. Confirmed she had a ride home which is her husband would be coming back to pick up pt when done and sometimes she needs to use [facility] Popper if he is unable to pick her up. Pt's Spravato is ordered through [employer] and delivered to office, all Spravato medication is stored at doctors office per REMS/FDA guidelines. The medication is required to be locked behind two doors per FDA/REMS Protocol. Medication is also disposed of properly per regulations.   Began taking patient's vital signs at 10:15 AM 147/101, pulse 83, repeated 138/100 Pulse Ox 99%. Dr. Clovis Pu is aware her B/P can be up when she arrives but normally goes down at her 1/2 way mark. Gave patient first dose 28 mg nasal spray, each nasal spray administered in each nostril as directed and waited 5 minutes between the second and third dose. All 3 doses given pt did not complain of any nausea/vomiting, given a cup of water due to the taste after the administration of Spravato.  She listens to [employer] with spa or relaxing music.  Checked 40 minute vitals at 10:55 AM, 134/95, pulse 81, Pulse Ox 92%. Explained she would be monitored for a total time of 120 minutes. Discharge vitals were taken at 12:15 PM 127/92 P 75, 99% Pulse Ox. Dr. Clovis Pu met with pt today and discussed her medication. I walked pt to elevator, where her husband met her for the ride home. Recommend she go home and sleep or just relax on the couch. No driving, no intense activities. Verbalized understanding. Nurse was with pt a total of 70 minutes for clinical. Pt is scheduled next Monday, February 5th 2024. Pt instructed to call office with any problems or questions.      LOT  23MG [id number] EXP [id number]

## 2022-12-13 ENCOUNTER — Telehealth: Payer: Self-pay

## 2022-12-13 NOTE — Telephone Encounter (Signed)
Pt reports she is not doing well. She feels the decline started with the increase in Pramipexole. She also started Auvelity on Monday after her Spravato treatment. Pt will be in on Monday, February 5th for next [facility] treatment. Is there anything she can do prior to then?

## 2022-12-13 NOTE — Telephone Encounter (Signed)
Please send for refills, she will need by Monday

## 2022-12-13 NOTE — Telephone Encounter (Signed)
This is what she was told 1/29:  retry Auvelity.  To improve tolerability and reduce risk of side effects, Stop Wellbutrin and start Auvelity 1 in the morning for 1 week then 1 twice daily AndReduce pramipexole 0.5 mg BID and reduce nortriptyline to 50 mg HS  So feeling more depressed has nothing to do with pramipexole bc we reduced it.  Continue the Auvelity.  If she is tolerating it well she can increase it to the normal dose of BID.  Make sure she reduced pramipexole and nortriptyline as directed.

## 2022-12-13 NOTE — Telephone Encounter (Signed)
Noted thank you-I will discuss with her.

## 2022-12-13 NOTE — Telephone Encounter (Signed)
Pt reports she did as instructed but did NOT stop Wellbutrin. Advised her to stop taking the Wellbutrin and continue everything else as instructed.  She agreed.

## 2022-12-17 ENCOUNTER — Ambulatory Visit (INDEPENDENT_AMBULATORY_CARE_PROVIDER_SITE_OTHER): Payer: 59 | Admitting: Psychiatry

## 2022-12-17 ENCOUNTER — Ambulatory Visit: Payer: 59

## 2022-12-17 VITALS — BP 104/77 | HR 65

## 2022-12-17 DIAGNOSIS — F339 Major depressive disorder, recurrent, unspecified: Secondary | ICD-10-CM | POA: Diagnosis not present

## 2022-12-17 DIAGNOSIS — F5105 Insomnia due to other mental disorder: Secondary | ICD-10-CM

## 2022-12-17 DIAGNOSIS — I10 Essential (primary) hypertension: Secondary | ICD-10-CM

## 2022-12-17 DIAGNOSIS — F411 Generalized anxiety disorder: Secondary | ICD-10-CM | POA: Diagnosis not present

## 2022-12-17 DIAGNOSIS — F900 Attention-deficit hyperactivity disorder, predominantly inattentive type: Secondary | ICD-10-CM | POA: Diagnosis not present

## 2022-12-18 NOTE — Progress Notes (Signed)
NURSES NOTE:  Patient arrived for her 51st  Spravato treatment. Pt is being treated for Treatment Resistant Depression, pt will be receiving 84 mg which will continue to be her maintenance dose, she receives weekly treatments.  Pt is also planning on [place] maybe. Patient arrived and taken to treatment room. Confirmed she had a ride home which is her husband would be coming back to pick up pt when done and sometimes she needs to use [facility] if he is unable to pick her up. Pt's Spravato is ordered through [employer] and delivered to office, all Spravato medication is stored at doctors office per REMS/FDA guidelines. The medication is required to be locked behind two doors per FDA/REMS Protocol. Medication is also disposed of properly per regulations.   Began taking patient's vital signs at 10:00 AM 142/93, pulse 65, pulse Ox 96%. Gave patient first dose 28 mg nasal spray, each nasal spray administered in each nostril as directed and waited 5 minutes between the second and third dose. All 3 doses given pt did not complain of any nausea/vomiting, given a cup of water due to the taste after the administration of Spravato.  She listens to [employer] with spa or relaxing music.  Checked 40 minute vitals at 10:40 AM, 110/73, pulse 71, Pulse Ox 94%. Explained she would be monitored for a total time of 120 minutes. Discharge vitals were taken at 12:00 PM 104/77 P 65, 99% Pulse Ox. Dr. Clovis Pu met with pt today and discussed her medication. I walked pt to elevator, where her husband met her for the ride home. Recommend she go home and sleep or just relax on the couch. No driving, no intense activities. Verbalized understanding. Nurse was with pt a total of 70 minutes for clinical. Pt is scheduled next Monday, February 12th 2024. Pt instructed to call office with any problems or questions.      LOT 23MG [id number] EXP [id number]

## 2022-12-24 ENCOUNTER — Encounter: Payer: 59 | Admitting: Psychiatry

## 2022-12-25 ENCOUNTER — Ambulatory Visit: Payer: 59

## 2022-12-25 ENCOUNTER — Encounter: Payer: 59 | Admitting: Behavioral Health

## 2022-12-25 VITALS — BP 146/104 | HR 63

## 2022-12-25 DIAGNOSIS — F339 Major depressive disorder, recurrent, unspecified: Secondary | ICD-10-CM

## 2022-12-25 NOTE — Progress Notes (Signed)
NURSES NOTE:   Pt arrived again today for her Spravato Treatment, when she came in yesterday 12/24/2022 her B/P was elevated 154/102, 67 pulse. Given clonidine 0.1 mg per Dr. Clovis Pu advisement. Pt reports she wasn't sure if she took her medication. After waiting 40 minutes rechecked with 157/84, pulse 72 and 168/96, pulse 59. Dr. Clovis Pu aware and did not want to start her treatment. Pt arrived today with her B/P higher 173/112, pulse 63, rechecked 15 minutes later 146/104, pulse 63.   Instructed pt to go home and monitor her B/P this afternoon and in the morning. She reports she would like to come back this week but I told her to let me know how her readings are before we proceed with scheduling. She agreed.   Lesle Chris, NP notified due to Dr. Clovis Pu being out of office.

## 2022-12-26 ENCOUNTER — Ambulatory Visit (INDEPENDENT_AMBULATORY_CARE_PROVIDER_SITE_OTHER): Payer: 59 | Admitting: Adult Health

## 2022-12-26 ENCOUNTER — Ambulatory Visit: Payer: 59

## 2022-12-26 ENCOUNTER — Other Ambulatory Visit: Payer: Self-pay

## 2022-12-26 ENCOUNTER — Encounter: Payer: Self-pay | Admitting: Adult Health

## 2022-12-26 VITALS — BP 92/64 | HR 65

## 2022-12-26 DIAGNOSIS — F339 Major depressive disorder, recurrent, unspecified: Secondary | ICD-10-CM

## 2022-12-26 DIAGNOSIS — F900 Attention-deficit hyperactivity disorder, predominantly inattentive type: Secondary | ICD-10-CM

## 2022-12-26 MED ORDER — AMPHETAMINE-DEXTROAMPHET ER 20 MG PO CP24: 20.0000 mg | ORAL_CAPSULE | ORAL | 0 refills | Status: DC

## 2022-12-26 NOTE — Progress Notes (Signed)
Laura Chang [id number] February 28, 1956 67 y.o.  Subjective:   Patient ID:  Laura Chang is a 67 y.o. (DOB 1956-05-24) female.  Chief Complaint: No chief complaint on file.   HPI Laura Chang presents to the office today for follow-up of  treatment resistant depression (TRD).   Spravato treatment    Patient was administered Spravato 84 mg intranasally today. Patient was observed by provider throughout [facility] treatment. The patient experienced the typical dissociation which gradually resolved over the 2-hour period of observation. There were no complications. Specifically, the patient did not have any untoward side effects - feeling disconnected from themself, their thoughts, feelings and things around them, dizziness, nausea, feeling sleepy, decreased feeling of sensitivity (numbness) spinning sensation, feeling anxious, lack of energy, increased blood pressure, feeling happy or very excited, or headache. Blood pressures remained within normal ranges at the 40-minute and 2-hour follow-up intervals. By the time the 2-hour observation period was met the patient was alert and oriented and able to exit without assistance. Patient willing to continue Spravato administration for the treatment of resistant depression. See nursing note for further details.   ECT-MADRS    Flowsheet Row Clinical Support from 08/06/2022 in [place] from 07/04/2022 in [place] from 05/21/2022 in [place] Office Visit from 03/02/2022 in [facility]  MADRS Total Score 29 15 27 $ 46        Review of Systems:  Review of Systems  Musculoskeletal:  Negative for gait problem.  Neurological:  Negative for tremors.  Psychiatric/Behavioral:         Please refer to HPI    Medications: I have reviewed the patient's current medications.  Current Outpatient Medications   Medication Sig Dispense Refill   amLODipine (NORVASC) 2.5 MG tablet Take 2.5 mg by mouth daily.     amphetamine-dextroamphetamine (ADDERALL XR) 20 MG 24 hr capsule Take 1 capsule (20 mg total) by mouth every morning. 30 capsule 0   amphetamine-dextroamphetamine (ADDERALL XR) 20 MG 24 hr capsule Take 1 capsule (20 mg total) by mouth every morning. 30 capsule 0   buPROPion (WELLBUTRIN XL) 150 MG 24 hr tablet TAKE 3 TABLETS BY MOUTH DAILY 270 tablet 1   cloNIDine (CATAPRES) 0.2 MG tablet Take 1 tablet (0.2 mg total) by mouth 2 (two) times daily. 60 tablet 2   iron polysaccharides (NIFEREX) 150 MG capsule TAKE 1 CAPSULE BY MOUTH EVERY DAY 90 capsule 1   LORazepam (ATIVAN) 1 MG tablet Take 1 tablet (1 mg total) by mouth every 8 (eight) hours as needed. for anxiety 90 tablet 0   losartan (COZAAR) 50 MG tablet Take 50 mg by mouth daily.     nebivolol (BYSTOLIC) 2.5 MG tablet Take 2.5 mg by mouth daily.     nortriptyline (PAMELOR) 25 MG capsule Take 2 capsules (50 mg total) by mouth at bedtime. 180 capsule 0   pramipexole (MIRAPEX) 0.5 MG tablet Take 1 tablet (0.5 mg total) by mouth 3 (three) times daily. 90 tablet 0   SPRAVATO, 84 MG DOSE, 28 MG/DEVICE SOPK USE 3 SPRAYS IN EACH NOSTRIL ONCE A WEEK 3 each 5   traZODone (DESYREL) 50 MG tablet TAKE 1-2 TABLETS BY MOUTH NIGHTLY AS NEEDED FOR SLEEP 180 tablet 1   No current facility-administered medications for this visit.    Medication Side Effects: None  Allergies:  Allergies  Allergen Reactions   Metronidazole Shortness Of Breath and  Other (See Comments)    Heart pounding   Ferrlecit [Na Ferric Gluc Cplx In Sucrose] Other (See Comments)    Infusion reaction 05/12/2019    Past Medical History:  Diagnosis Date   ADHD    Anemia    Anxiety    Arthritis    Depression    Heart murmur    i went to see a cardiologit slast eyar  and i had zero plaque,    PONV (postoperative nausea and vomiting)    Recovering alcoholic in remission [facility])      Past Medical History, Surgical history, Social history, and Family history were reviewed and updated as appropriate.   Please see review of systems for further details on the patient's review from today.   Objective:   Physical Exam:  There were no vitals taken for this visit.  Physical Exam Constitutional:      General: She is not in acute distress. Musculoskeletal:        General: No deformity.  Neurological:     Mental Status: She is alert and oriented to person, place, and time.     Coordination: Coordination normal.  Psychiatric:        Attention and Perception: Attention and perception normal. She does not perceive auditory or visual hallucinations.        Mood and Affect: Mood normal. Mood is not anxious or depressed. Affect is not labile, blunt, angry or inappropriate.        Speech: Speech normal.        Behavior: Behavior normal.        Thought Content: Thought content normal. Thought content is not paranoid or delusional. Thought content does not include homicidal or suicidal ideation. Thought content does not include homicidal or suicidal plan.        Cognition and Memory: Cognition and memory normal.        Judgment: Judgment normal.     Comments: Insight intact     Lab Review:     Component Value Date/Time   NA 137 01/12/2021 1430   NA 140 11/18/2018 1544   K 3.8 01/12/2021 1430   CL 108 01/12/2021 1430   CO2 22 01/12/2021 1430   GLUCOSE 94 01/12/2021 1430   BUN 14 01/12/2021 1430   BUN 20 11/18/2018 1544   CREATININE 0.82 01/12/2021 1430   CALCIUM 8.9 01/12/2021 1430   PROT 6.6 01/12/2021 1430   ALBUMIN 3.9 01/12/2021 1430   AST 12 (L) 01/12/2021 1430   ALT 11 01/12/2021 1430   ALKPHOS 46 01/12/2021 1430   BILITOT 0.5 01/12/2021 1430   GFRNONAA >60 01/12/2021 1430   GFRAA >60 09/02/2019 0249   GFRAA >60 01/27/2019 0811       Component Value Date/Time   WBC 4.5 01/12/2021 1430   RBC 4.32 01/12/2021 1430   HGB 12.8 01/12/2021 1430   HGB  12.9 07/17/2019 0953   HCT 38.5 01/12/2021 1430   HCT 21.9 (L) 12/25/2018 1221   PLT 272 01/12/2021 1430   PLT 286 07/17/2019 0953   MCV 89.1 01/12/2021 1430   MCH 29.6 01/12/2021 1430   MCHC 33.2 01/12/2021 1430   RDW 12.4 01/12/2021 1430   LYMPHSABS 1.4 01/12/2021 1430   MONOABS 0.4 01/12/2021 1430   EOSABS 0.0 01/12/2021 1430   BASOSABS 0.0 01/12/2021 1430    No results found for: "POCLITH", "LITHIUM"   No results found for: "PHENYTOIN", "PHENOBARB", "VALPROATE", "CBMZ"   .res Assessment: Plan:    Recommendations/Plan  Continue Spravato treatment.   Patient advised to contact office with any questions, adverse effects, or acute worsening in signs and symptoms.  Diagnoses and all orders for this visit:  Recurrent major depression resistant to treatment [facility])     Please see After Visit Summary for patient specific instructions.  No future appointments.  No orders of the defined types were placed in this encounter.   -------------------------------

## 2022-12-28 ENCOUNTER — Encounter: Payer: Self-pay | Admitting: Psychiatry

## 2022-12-28 NOTE — Progress Notes (Signed)
[name] [id number] 12-16-1955 67 y.o.  Subjective:   Patient ID:  Laura Chang is a 67 y.o. (DOB [birth year]) female.  Chief Complaint:  Chief Complaint  Patient presents with   Follow-up   Depression   Anxiety      Laura Chang presents to the office today for follow-up of depression and anxiety and ADD.  seen November 12, 2019.  Melted down in 2020.  Went to [employer] in July.  No withdrawal.  1 drink since then.  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 She was on no stimulant and no SSRI but was taking Strattera and Wellbutrin.  The following changes were made. Stop Strattera. OK restart stimulant bc severe ADD Restart Adderall 1 daily for a few days and if tolerated then restart 1 twice daily. If not tolerated reduce the dosage if needed. May need to stop Wellbutrin if not tolerating the stimulant.  Yes.  DC Wellbutrin Restart Prozac 20 mg daily.  February 2021 appointment with the following noted: Completed grant proposal.  Couldn't doit without Adderall.  Sold a bunch of work.   Adderall XR lasts about 3 pm.  Strength seems about right.  BP been OK.  Not jittery.   Stopped Wellbutrin but had no SE. Mood drastically better with grant proposal and back on fluoxetine.  Less depressed and lethargic.  No anxiety.  Cut back on coffee. Started back with devotions and stronger faith. Plan: Continue Prozac 20 mg daily. May have to increase the dose at some point in the future given that she usually was taking higher dosages but she is getting good response at this time. Restart Wellbutrin off label for ADD since can't get 2 ADDERALL daily. 150 mg daily then 300 mg daily. She can adjust the dose between 150 mg and 300 mg daily to get the optimal effect.   05/11/2020 appointment with the following noted: Has been inconsistent with Prozac and Wellbutrin. Not sure of the effect of Wellbutrin. Biggest deterrent in work is anxiety.  Some of  the work is conceptual and difficult at times.  Can feel she's not up to a project at times.  Overall is OK but would like a steadier benefit from stimulant.  Exhausted from managing concentration and keeping up with things from the day.  Loses things.  Not good keeping up with schedule. Overall productive and emotionally OK. Can feel Adderall wear off. Mood is better in summer and worse in the winter.   Laura Chang died in 10/12/23 and that is a loss. No SE Wellbutrin. Still attends AA meetings.  Real benefit from [place] last year. Recognizes effect of anemia on ADD and mood.  Had iron infusions last winter. Plan:  Wellbutrin off label for ADD since can't get 2 ADDERALL daily. 150 mg daily then 300 mg daily.  01/24/2021 appointment with following noted: Doing a program called [employer] mindfulness app since Xmas.  CBT app helped the depression.  App helped her focus better.  Lost sign weight. Writing a lot. Before Xmas felt depressed and started negative thinking worse, self denigrating. Not drinking. More isolated.   Recognizes mo is narcissist.    Didn't tell anyone she was born until 3 mos later.  M aloof and uninterested in pt.  Lied about her birthday.  Mo lack of affection even with pt's kids. Going to [place] for a year and it helped her to quit drinking. Also misses kids being gone with a hole  also. Plan: No med changes  05/04/2021 appointment with the following noted: Therapist [name] thinks she's manic. Lost weight to 144#.   States she is still sleeping okay.  Admits she is hyper and recognizes that she is likely manic.  She feels great, euphoric with an increased sense of spiritual connectedness to God.  She has racing thoughts and talks fast and talks a lot and this is noted by her husband.  He thinks she is a bit hyper.  She has been able to maintain sobriety although she will have 1 glass of wine on special occasions but does not drink by herself.  She is not drinking to excess.   She denies any dangerous impulsivity.  She is clearly not depressed and not particularly anxious.  She has no concerns about her medication and she has been compliant.  06/16/21 appt noted: So much better.  Going through a lot but the manic thing happened on top of it.  So much slower.  Didn't feel like losing anything with risperidone.  Likes the Adderalll at 10 mg. Some drowsiness in the AM and very drowsy from risperidone 2 mg HS. Prayer life is better. Handling stress better. Less depressed with risperidone. Still likes trazodone. Sleeps well. Plan: Reduce Prozac to 10 mg daily.  Consider stopping it because it can feel the mania however she is reluctant to do that because she fears relapse of depression. Reduce risperidone to 1.5 mg nightly due to side effects.  Discussed risk of worsening mania.  07/25/2021 appointment with the following noted: Misses the Adderall and hard to function without it. Depressed now. Heavy chest.  Anxious and guilty.  Body feels heavy.   Hates Wellbutrin.   Plan: Increase fluoxetine to 20 mg daily Add Abilify 1/2 of 15 mg tablet daily Wean wellbutrin by 1 tablet each week  bc she feels it is not helpful and DT polypharmacy Reduce risperidone to 1 daily for 1 week and stop it. Disc risk of mania. Increase Adderall to XR 20 mg AM  08/08/21 Much less depressed and starting to feel normal I feel a lot better. No SE.  Speech normal off risperidone. Sleeping OK on trazaodone and enough.   Noticed benefit from Adderall again. Plan: continue fluoxetine to 20 mg daily Continue Abilify 1/2 of 15 mg tablet daily for depression and mania continue Adderall to XR 20 mg AM  10/10/2021 phone call: Pt stated she feels like the Abilify should be decreased to 74m.She said she is depressed but rational and not suicidal.She has an appt Monday and can wait until then if you prefer. MD response: Reduce the Abilify to 7.5 mg every other day.  We will meet on 10/16/2021 and decide  what to do from there.  10/16/2021 appointment with the following noted: More depressed.  Most depressed I've ever been.  Just numb.  Sense of grief.   Thinks the manic episode was unlike anything else she ever had.  Doesn't want to medicate against it.  Don't enjoy people.  Easily overwhelmed.  Had some death thoughts but not suicidal.  Has been functional.  Feels better today after reducing Abilify to every other day but she is only been doing that for 3 days. A/P: Episode of post manic depression was explained. continue fluoxetine to 20 mg daily Hold Abilify for 1 week then resume Abilify 1/2 of 15 mg tablet every other day for depression and mania continue Adderall to XR 20 mg AM  10/27/2021 appointment with the  following noted: I'm doing so much better.  Handling the depression better. Better self talk and spiritual focus has helped.   Dep 6/10 manifesting as anxiety with low confidence.   Laura Chang died 2  years ago and M 67 yo and is dependent . She is working hard to feel better but still feels depressed.  She almost feels like she has a little more anxiety since restarting Abilify every other day. Plan: continue fluoxetine to 20 mg daily DC Abilify .  Vrayalar 1.5 mg QOD to try to get rid of depression ASAP. continue Adderall to XR 20 mg AM  11/10/2021 appointment with the following noted: Busy with Xmas and it was fun with family but then a big let down.  Did well with it.  Functioned well with it.  Working hard on things with depression.  Not shutting down. Not sure but feels better today but yesterday was hard.  Difficulty dealing with mother.  She won't do anything to help herself.  Yesterday with her all day.  Won't do PT and has isolated herself.    Lack of confidence.   No SE with Vraylar.  11/24/21 urgent appointment appt noted: More and more depressed.   So anxious and doesn't want to be alone but can do so. No appetite. Hurts inside. Has had some fleeting suicidal thoughts but  would not act on them.  Tolerating meds. Has been consistent with Vraylar 1.5 mg every other day, fluoxetine 20 mg daily Plan: Increase Vraylar to 1.5 mg daily Change Prozac to Trintellix 10 mg daily. Discussed side effects of each continue Adderall to XR 20 mg AM  12/27/2021 appointment with the following noted: Not OK.  I feel less depressed but feels bat shit. Not sleeping well.  Extremely anxious. Off and on sleep. 3-4 hours of sleep.   Still having daily SI.  But also become obvious has so much to do.  Overwhelmed by tasks.   Needs anxiety meds to just function. Not more motivated.  Walked yesterday.   Feels afraid like in trouble but not irritable or angry. DC DT agitation Vraylar to 1.5 mg daily Change Prozac to Trintellix 10 mg daily. Hold Adderall to XR 20 mg AM Clonidine 0.1 1/2 tablet twice daily for 2 days and if needed for anxiety and sleep increase to 1 twice daily Ok temporary Ativan 1 mg 3 times daily as needed anxiety  01/05/22 appt noted: Off fluoxetine and  Trintellix.  Only on Ativan, trazodone and Adderall XR 20 plus added clonidine 0.1 mg BID Didn't think she needed to start Trintellix. Not taking Ativan.   Didn't like herself last week. Feels some better today. Wonders if the manic sx Not agitated.  Anxiety kind of calmed down.  A lot to be anxious about situationally.  $ stress. Concerns about downers with meds. Can't access normal personality. ? Lethargy and inability to talk as sE. Plan: Latuda 20-40 mg daily with food. Adderall to XR 20 mg AM Clonidine 0.1 1/2 tablet twice daily  reduce dose to be sure no SE Ok temporary Ativan 1 mg 3 times daily as needed anxiety  01/19/22 appt noted: Taking Latuda 20 mg daily.  Took 40 mg once and felt anxious and  SI Still depressed and not very reactive Anxiety mainly about the depression and fears of the future. She wants to revisit manic sx and thinks it was maybe bc taking delta 8 bc was taking a lot of it so  still doesn't think she's  classic bipolar. She wants to only take Prozac bc thinks Latuda is perpetuating depression. Says the delta 8 was very psychaedelic.  When not taking it was not manic.  Sleeping ok again.  Plan: Per her request DC Latuda 20-40 mg daily with food. She wants to continue Prozac alone AMA  Adderall to XR 20 mg AM Clonidine 0.1 1/2 tablet twice daily  reduce dose to be sure no SE Ok temporary Ativan 1 mg 3 times daily as needed anxiety  01/23/2022 phone call complaining of increased anxiety since stopping Latuda.  She will try increasing clonidine.  01/26/2022 phone call not feeling well and wanted to restart the Vraylar.  However notes indicate that had made her agitated therefore she was encouraged to pick up samples of Rexulti 1 mg and start that instead.  02/06/2022 phone call: Stating she felt the Rexulti was helping with depression but she was not sleeping well and obsessing over things.  She was encouraged to increase Rexulti to 2 mg daily and increase trazodone for sleep.  02/09/2022 appointment with the following noted: This was an urgent work in appointment No sleep last night with trazodone 100 mg HS Nothing really better depression or anxiety. Ruminating negative anxious thoughts. Did not tolerate Rexulti because it was causing insomnia.  Does not think it helped depression.  Lacks emotion that she should have.  Lacks her usual personality.  Some hopeless thoughts.  Some death thoughts.  Some suicidal thoughts without plan or intent Plan: DC Rexulti and Prozac & DC trazodone Adderall to XR 20 mg AM Clonidine 0.1 1/tablet twice daily  reduce dose to be sure no SE Ok temporary Ativan 1 mg 3 times daily as needed anxiety Start Seroquel XR 150 mg nightly  03/02/2022 appointment: Langley Gauss called back a few days after starting Seroquel stating it was making her more anxious and more depressed.  This seemed unlikely as this medicine rarely ever causes anxiety.  She  stopped the medication waited 3 days and called back still had anxiety and depression but thought perhaps the anxiety was a little better.  She did not want to take the Seroquel. She knew about the option of Spravato and wanted to pursue that. Now questions whether to return to Seroquel while waiting to start Spravato bc feels just as bad without it and knows she didn't give it enough time to work.   MADRS 46  ECT-MADRS    Flowsheet Row Clinical Support from 08/06/2022 in [facility] from 07/04/2022 in [facility] from 05/21/2022 in [facility] Office Visit from 03/02/2022 in [facility]  MADRS Total Score 29 15 27 $ 46      03/14/22 appt noted: Pt received Spravato 56 mg first dose today with some dissociative sx which were not severe.  She was anxious prior to the administration and felt better after receiving lorazepam 1 mg.  No NV, or HA. Wants to continue Spravato. Ongoing depression and desperate to feel better.  I'm not myself DT deprsssion which is most severe in recent history.  Anhedonia.  Low motivation.  Social avoidance. Continues to think all recent med trials are making her worse.  Sleep ok with Seroquel.  03/16/22 appt noted: Received Spravato 84 mg for the first time.  some dissociative sx which were not severe.  She was anxious prior to the administration and felt better after receiving lorazepam 1 mg.  No NV, or  HA. Wants to continue Spravato.   Does not feel any better or different since the last appt.  Ongoing depression.  Ongoing depression and desperate to feel better.  I'm not myself DT deprsssion which is most severe in recent history.  Anhedonia.  Low motivation.  Social avoidance. Continues to think all recent med trials are making her worse.  Sleep ok with Seroquel.  Does not want to continue Seroquel for TRD.  03/20/2022  appointment noted: Came for Spravato administration today.  However blood pressure was significantly elevated approximately 180/115.  She was given lorazepam 1 mg and clonidine 0.2 mg to try to get it down. She states she regretted stopping the Seroquel XR 300 mg tablets.  She now realizes it was helpful.  She did not sleep much at all last night.  She did not take the Adderall this morning. 2 to 3 hours after arrival blood pressure was still elevated at  170/110, 62 pulse.  For Spravato administration was canceled for today.  She admits to being anxious and depressed.  She is not suicidal.  She is highly motivated to receive the Spravato.  We discussed getting it tomorrow.  03/22/2022 appointment noted: Patient's blood pressure was never stable enough yesterday in order to get her in for Spravato administration.  She was encouraged to see her primary care doctor.  It is better today.  03/26/2022 appointment with the following noted: Blood pressure was better.  Saw her primary care doctor who started on oral Bystolic 2.5 mg daily. Received Spravato 84 mg today as scheduled.  Tolerated it well without nausea or vomiting headache or chest pain or palpitations.  Her blood pressure was borderline but manageable. She remains depressed and anxious.  She is ambivalent about the medicine and desperate to get to feel better.  Continues to have anhedonia and low energy and low motivation and reduced ability to do things.  Less social.  Not suicidal.  03/28/22 appt noted: Received Spravato 84 mg today as scheduled.  Tolerated it well without nausea or vomiting headache or chest pain or palpitations.  Her blood pressure was borderline but manageable. Has not seen any improvement so far.  Tolerating Seroquel.  Inconsistent with Bystolic and BP has been borderline high. Still depressed and anxious and anhedonia.  Low motivation, energy, productivity. Taking quetiapine and tolerating XR 300 mg nightly.  04/04/22  appt noted: Received Spravato 84 mg today as scheduled.  Tolerated it well without nausea or vomiting headache or chest pain or palpitations.  Her blood pressure was borderline but manageable. Has not seen any improvement so far.  Tolerating Seroquel.   She still tends to think that the medications are making her worse.  She has said this about each of the recent psychiatric medicines including Seroquel.  However her husband thinks she is improved.  She also admits there is some improvement in productivity.  She still feels highly anxious.  She still does not enjoy things as normal.  She still feels desperate to improve as soon as possible. Has been taking Seroquel XR since 03/20/2022  04/10/22 appt noted: Received Spravato 84 mg today as scheduled.  Tolerated it well without nausea or vomiting headache or chest pain or palpitations.  Her blood pressure was borderline but manageable. Has not seen any improvement so far.  Tolerating Seroquel.  Doesn't like Seroquel bc she thinks it flattens here. Ongoing depression without confidence Plan: Start Auvelity 1 every morning for persistent treatment resistant depression  04/12/2022 appointment with the  following noted: Received Spravato 84 mg today as scheduled.  Tolerated it well without nausea or vomiting headache or chest pain or palpitations.  Her blood pressure was borderline but manageable. Has not seen any improvement so far.  Tolerating Seroquel.  Doesn't like Seroquel bc she thinks it flattens her. Received Spravato 84 mg today as scheduled.  Tolerated it well without nausea or vomiting headache or chest pain or palpitations.  Her blood pressure was borderline but manageable. Has not seen any improvement so far.  Tolerating Seroquel.  Doesn't like Seroquel bc she thinks it flattens here.  We discussed her ambivalence about it. She is starting Auvelity and has tolerated it the last 2 days without side effect.  She still does not feel like herself and  feels flat and not enjoying things with suppressed expressed emotion  04/17/2022 appointment with the following noted: Received Spravato 84 mg today as scheduled.  Tolerated it well without nausea or vomiting headache or chest pain or palpitations.  Her blood pressure was borderline but manageable. Has not seen any improvement so far.  Tolerating Seroquel.  Doesn't like Seroquel bc she thinks it flattens her. She has been tolerating the Auvelity 1 in the morning without side effects for about a week.  She has not noticed significant improvement so far.  She still feels depressed and flat and not herself.  Other people notice that she is flat emotionally.  She is not suicidal.  She does feel discouraged that she is not getting better yet.  04/19/2022 appointment noted: Has increased Auvelity to 1 twice daily for 2 days, continues quetiapine XR 300 mg nightly, clonidine 0.3 mg twice daily, lorazepam 1 mg twice daily for anxiety and Adderall XR 20 mg in the morning. No obious SE but she still thinks quetiapine XR is making her feel down.  But not sedated Received Spravato 84 mg today as scheduled.  Tolerated it well without nausea or vomiting headache or chest pain or palpitations.  Her blood pressure was borderline but manageable. She still feels quite anxious and feels it necessary to take both the clonidine and lorazepam twice a day to manage her anxiety.  She has been consistently down and flat and not herself until yesterday afternoon she noted an improvement in mood and feeling much more like herself with her normal personality reemerging.  She was quite depressed in the morning with very dark negative thoughts.  She did not have those dark negative thoughts this morning.  She had a lot of questions about medication and when she was expecting to be improved and why she has not shown improvement up to now.  04/23/22 appt noted: Has increased Auvelity to 1 twice daily for 1 week, continues quetiapine XR  300 mg nightly, clonidine 0.3 mg twice daily, lorazepam 1 mg twice daily for anxiety and Adderall XR 20 mg in the morning. No obious SE but she still thinks quetiapine XR is making her feel down.  But not sedated Received Spravato 84 mg today as scheduled.  Tolerated it well without nausea or vomiting headache or chest pain or palpitations.  She is still depressed but admits better function and is able to enjoy social interactions. Tolerating meds.  Would like to feel better for sure. Not herself.  Flat. Plan increase Auvelity to 1 tab BID as planned and reduce Quetiapine to 1/2 of ER 300 mg  bc NR for depression.  04/25/2022 appointment with the following noted: clonidine 0.3 mg twice daily, lorazepam 1 mg  twice daily for anxiety and Adderall XR 20 mg in the morning. Seroquel XR 300 HS No obious SE but she still thinks quetiapine XR is making her feel down.  But not sedated Received Spravato 84 mg today as scheduled.  Tolerated it well without nausea or vomiting headache or chest pain or palpitations.  Called yesterday with more anxiety.  Had increased Auvelity for 1 day and reduced Seroquel XR for 1 day.  Felt restless and fearful  05/01/2022 appointment noted: clonidine 0.3 mg twice daily, lorazepam 1 mg twice daily for anxiety and Adderall XR 20 mg in the morning. Seroquel XR 150 HS, Auvelity 1 BID Received Spravato 84 mg today as scheduled.  Tolerated it well without nausea or vomiting headache or chest pain or palpitations.  Nurse has noted patient has called multiple times sometimes asking the same question repeatedly.  It is unclear whether she is truly forgetful or is just anxious seeking reassurance. Patient acknowledges ongoing depression as well as some anxiety but states she has felt a little better in the last couple of days.  She has reduced the Seroquel to 150 mg at night and has increased Auvelity to 1 twice daily but only for 1 day.  So far she seems to be tolerating it.  05/03/22  appt noted: clonidine 0.2 mg twice daily, lorazepam 1 mg twice daily for anxiety and Adderall XR 20 mg in the morning. Seroquel XR 150 HS, Auvelity 1 BID BP high this am about 170/100 and received extra clonidine 0.2 mg and came to receive Spravato.  Not dizzy, no SOB, nor CP but BP is still high Could not receive Spravato today bc BP high and pulse low at 30 ppm. Still depressed and anxious. Plan: continue trial Auvelity with Spravato She needs to get BP and pulse managed  05/08/22 TC: RTC  H Michael NA and mailbox full.  Could not leave message.  Pt  -  talked to she and H on speaker. H worried over wife.  Vacant stare.  Slurs words at times.  Not smiling. Reduced enjoyment.  Depression.  Withdrawn from usual activities.  Some irritability.  Anxious. Disc her concerns meds are making her worse.  Extensive discussion about her treatment resistant status.  There is a consistent pattern of not taking the medicines long enough to get benefit because she believes the meds are making her worse.  However the symptoms she describes as side effects are exactly the same symptoms that she had prior to taking the medication RX for  the depression.  So it is not clear that these are actual side effects. This is true about the 2 most recent meds including Seroquel and Auvelity.  Recommend psychiatric consultation in hopes of improving her comfort level with taking prescribed medications for a sufficient length of time to provide benefit. Extensive discussion about ECT is the treatment of choice for treatment resistant depression.  Spravato may work if she can comply with consistency.  There are medication options but they take longer to work.   Plan:  Reduce clonidine to 0.1 mg BID DT bradycardia.  Talk with PCP about BP and low pulse problems which are interfering with her consistent compliance with Spravato.   Limit lorazepam to 3 -4  mg daily max. Excess use is the cause of slurring speech.  She must stop  excess use or will have to stop the med. Stop Auvelity per her request.  But she has only been on the full dose for a  little over a week and clearly has not had time to get benefit from it.  She thinks maybe it is making her more anxious. Reduce Seroquel from 150XR to 50 -100 mg at night IR.  She couldn't sleep when stopped it completely. Will not start new antidepressant until her SE issues are resolved or not. Get second psych opinion from Yehuda Budd MD or another psychiatrist.  H's sister is therapist in [place] Hoyer, MD, [facility]  05/16/2022 appointment with the following noted: Received Spravato 84 mg today as scheduled.  Tolerated it well without nausea or vomiting headache or chest pain or palpitations.  She stopped Auvelity as discussed last week. On her own, without physician input, she restarted Wellbutrin XL 450 mg every morning today.  She had taken it in the past.  She feels jittery and anxious. She feels less depressed than she did last week.  But she is still depressed without her usual range of affect.  She still is less social and less motivated than normal. Her primary care doctor increased the dose of losartan Plan: Stop Seroquel Reduce Wellbutrin XL to 300 mg every morning.  Starting the dose at 450 every morning is likely causing side effects of jitteriness and it should not be started at that have a dosage. Recommend she not change meds on her own without MDM put  05/23/2022 appointment with the following noted: Received Spravato 84 mg today as scheduled.  Tolerated it well without nausea or vomiting headache or chest pain or palpitations.  Has not dropped seroquel XR 300 mg 1/2 tablet nightly bc couldn't sleep without it. Has not tried lower dose quetiapine 50 mg HS Still feels depressed.   BP is better managed so far, just saw PCP.  BP is better today and infact is low today. Dropped clonidine as directed from 0.3 mg BID bc inadequate control of BP to 0.2 mg BID.   However she wants to increase it back to 0.3 mg twice daily because she feels it helped her anxiety better.  Wonders about increasing Wellbutrin for depression.  However she has only been on 300 mg a day for a week.  She was on 450 mg daily in the past.  06/06/22 appt noted: Received Spravato 84 mg today as scheduled.  Tolerated it well without nausea or vomiting headache or chest pain or palpitations.  She is still depressed and anxious.  She wants to try to stop the Seroquel but cannot sleep without some of it.  She is taking lorazepam 1 mg 4 times daily and still having a lot of anxiety.  She wants to increase clonidine back to 0.3 mg twice daily.  She hopes for more improvement She recently went for a second psychiatric opinion as suggested the results of that are pending.  06/11/22 appt noted: Received Spravato 84 mg today as scheduled.  Tolerated it well without nausea or vomiting headache or chest pain or palpitations.  She is still depressed and anxious. Without much change.  Still hopeless, anhedonia, reduced inteterest and motivation.  Tolerating meds. Disc concerns Spravato is not hleping much. Plan: stop Seroquel and start olanzapine 10 mg HS for TRD and anxiety.  06/13/2022 appointment noted: Received Spravato 84 mg today as scheduled.  Tolerated it well without nausea or vomiting headache or chest pain or palpitations.  She is still depressed and anxious. Without much change.  Still hopeless, anhedonia, reduced inteterest and motivation.  Tolerating meds. Disc concerns Spravato is not helping much as hoped  but is improving a bit in the last week. Tolerating meds. Continues Wellbutrin XL 450 AM, tolerating recently started olanzapine  10 mg HS. Sleep is good.   Pending appt with [place] consult.  06/18/22 appt noted: Received Spravato 84 mg today as scheduled.  Tolerated it well without nausea or vomiting headache or chest pain or palpitations.  Tolerating meds. Continues Wellbutrin XL  450 AM, tolerating recently started olanzapine  10 mg HS. Continues Adderall XR 20 amd and has tried to reduce lorazepam to 10m TID Sleep is good.   [place] [facility] [facility]. Depression is a little bit better in the last week with a little improvement in emotional expression and interest.  She is pushing herself to be more active.  Her daughter thought she was a little better than she has been.  However she is still depressed and still not her normal self with anhedonia and reduced emotional expressiveness.  06/20/22 appt noted: Received Spravato 84 mg today as scheduled.  Tolerated it well without nausea or vomiting headache or chest pain or palpitations.  Tolerating meds with a little sleepiness. Continues Wellbutrin XL 450 AM, tolerating recently started olanzapine  10 mg HS. Continues Adderall XR 20 amd and has tried to reduce lorazepam to 139mTID Sleep is good.   Mood is improving.  Better funciton.  Anxiety is better with olanzapine. Still not herself and depression not gone with some anhedonia and social avoidance and feeling overwhelmed.  8/14 2023 received Spravato 84 mg 06/27/2022 received Spravato Spravato 84 mg 07/02/2022 received Spravato 84 mg 07/04/2022 received Spravato 84 mg  07/09/2022 appointment noted: Received Spravato 84 mg today as scheduled.  Tolerated it well without nausea or vomiting headache or chest pain or palpitations.  Expected dissociation and feels less depressed with resolution of negative emotions immediately after Spravato and then depression, anxiety creep back in. Continues meds Adderall XR 20 mg every morning, Wellbutrin XL 450 every morning, clonidine 0.1 mg twice daily, lorazepam 1 mg every 6 hours as needed, olanzapine increased from 7.5 to 10 mg nightly on  Tolerating meds.  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  She does  note some increased appetite and is somewhat concerned about that but has not gained significant amounts of weight. She has had the [facility] which was initially denied but she knows it can be appealed.  However because she is improving with Spravato plus the other medications now she wants to continue the current treatment plan.  07/18/22 appt noted: Continues meds Adderall XR 20 mg every morning, Wellbutrin XL 450 every morning, clonidine 0.1 mg twice daily, lorazepam 1 mg every 6 hours as needed, olanzapine increased from 7.5 to 10 mg nightly on 07/04/2022. Received Spravato 84 mg today as scheduled.  Tolerated it well without nausea or vomiting headache or chest pain or palpitations.  Expected dissociation and feels less depressed with resolution of negative emotions immediately after Spravato and then depression, anxiety creep back in. Continues meds Adderall XR 20 mg every morning, Wellbutrin XL 450 every morning, clonidine 0.1 mg twice daily, lorazepam 1 mg every 6 hours as needed, olanzapine increased from 7.5 to 10 mg nightly on  Tolerating meds.  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  She does note some increased  appetite and is somewhat concerned about that but has not gained significant amounts of weight. She has had the [place] consultation which was initially denied but she knows it can be appealed. She continues to have chronic ambivalence about psychiatric medicines and initially tends to blame her depressive symptoms such as decreased concentration and feeling flat on what ever medicine she currently is taking even though she had the same symptoms before the current medicines were started.  Then after discussion she does admit that her depressive symptoms are improved since adding olanzapine but still has those residual symptoms noted.  07/23/22 received Spravato  84 mg   07/30/2022 appointment noted: Received Spravato 84 mg today as scheduled.  Tolerated it well without nausea or vomiting headache or chest pain or palpitations.  Expected dissociation and feels less depressed with resolution of negative emotions immediately after Spravato and then depression, anxiety creep back in. Continues meds Adderall XR 20 mg every morning, Wellbutrin XL 450 every morning, clonidine 0.1 mg twice daily, lorazepam 1 mg every 6 hours as needed, olanzapine increased from 7.5 to 10 mg nightly on  She has been inconsistent with olanzapine because she continues to be ambivalent about the medications in general and thinks that perhaps the 10 mg is making her feel blunted.  She continues to feel some depression.  She had a good day this week and but still feels somewhat depressed and persistently anxious. Plan: be consistent with olanzapine 10 mg HS for TRD and longer trial for potential benefit for anxiety.  Has not taken it consistently.  08/06/2022 appointment noted: Received Spravato 84 mg today as scheduled.  Tolerated it well without nausea or vomiting headache or chest pain or palpitations.  Expected dissociation and feels less depressed with resolution of negative emotions immediately after Spravato and then depression, anxiety creep back in. Continues meds Adderall XR 20 mg every morning, Wellbutrin XL 450 every morning, clonidine 0.1 mg twice daily, lorazepam 1 mg every 6 hours as needed, olanzapine i 10 mg nightly  She continues to feel depressed but is about 50% better with Spravato.  She is still not herself.  She still has anhedonia.  She still is not her able to engage socially in the typical ways.  She is not jovial and outgoing like normal.  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  She continues to feel that her personality is dampened down.  There is a  question about whether it is related to depression or medication. Plan: continue olanzapine 10 for longer trial for TRD and severe anxiety.  08/13/22 appt noted:  Received Spravato 84 mg today as scheduled.  Tolerated it well without nausea or vomiting headache or chest pain or palpitations.  Expected dissociation and feels less depressed with resolution of negative emotions immediately after Spravato and then depression, anxiety creep back in. Continues meds Adderall XR 20 mg every morning, Wellbutrin XL 450 every morning, clonidine 0.1 mg twice daily, lorazepam 1 mg every 6 hours as needed, olanzapine i 10 mg nightly  She still does not feel herself.  Still struggles with depression and low motivation and reduced social engagement and reduced interest and reduced emotional expression.  She is somewhat better with the medicines plus Spravato.  She still believes the Spravato makes her blunted and is not sure how much it helps her anxiety.  She can have good  days when her family is around and she is engaged.  She still wants to stop the olanzapine. She has apparently continued to take the trazodone despite having been told to stop it when she started olanzapine.  She feels like she needs the trazodone. Plan: DC olanzapine and Start nortriptyline 25 mg nightly and build up to 75 mg nightly and then check blood level.    08/27/2022 appointment noted: Received Spravato 84 mg today as scheduled.  Tolerated it well without nausea or vomiting headache or chest pain or palpitations.  Expected dissociation and feels less depressed with resolution of negative emotions immediately after Spravato and then depression, anxiety creep back in. Continues meds Adderall XR 20 mg every morning, Wellbutrin XL 450 every morning, clonidine 0.1 mg twice daily, lorazepam 1 mg every 6 hours as needed. Stopped olanzapine and started nortriptyline which she has taken for about a week is 75 mg nightly. So far she is tolerating the  nortriptyline well with the exception of some dry mouth and constipation which she is working to manage.  She does not feel substantially better better or different off the olanzapine.  No change in her sleep which is good.  Main concern currently in addition to the residual depression is anxiety which is somewhat situational with pending arch show.  She is worrying about it more than normal.  Says she is having to take lorazepam twice a day where she had been able to keep reduce it prior to this.  She still does not feel like herself with residual depression with less social interest and less of her usual buoyancy in personality.  She is flatter than normal.  Overall she still feels that the Spravato has been helpful at reducing the severity of the depression.  She is not suicidal. She has not heard anything about the [place] appeal as of yet.  09/05/2022 appointment noted: Received Spravato 84 mg today as scheduled.  Tolerated it well without nausea or vomiting headache or chest pain or palpitations.  Expected dissociation and feels less depressed with resolution of negative emotions immediately after Spravato and then depression, anxiety creep back in. Continues meds Adderall XR 20 mg every morning, Wellbutrin XL 450 every morning, clonidine 0.1 mg twice daily, lorazepam 1 mg every 6 hours as needed. Stopped olanzapine and started nortriptyline which she has taken for about 2 week is 75 mg nightly. Initially blood pressure was a little high causing delay in starting Spravato.  She admitted to feeling a little wound up.  She still experiences a little increase in depression if she goes longer than a week in between doses of Spravato.  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  09/10/22 appt noted: Received Spravato 84 mg today as scheduled.  Tolerated it well without nausea or vomiting headache or chest  pain or palpitations.  Expected dissociation gradually resolved over the 2 hour observation period. She feels 50% less depressed with Spravto and wants to continue it.   Continues meds Adderall XR 20 mg every morning, Wellbutrin XL 450 every morning, clonidine 0.1 mg twice daily, lorazepam 1 mg every 6 hours as needed. Has started nortriptyline 75 mg nightly for about 3 weeks. Has not seen a significant difference with the addition of nortriptyline.  Tolerating it pretty well. She continues to have some degree of anhedonia and significant depression and anxiety.  Her daughters  noticed that she is more needy and calls more frequently.  She acknowledges this as well.  She is clearly still not herself. Plan: pramipexole off label and RX 0.25 mg BID  09/17/2022 appointment noted: Received Spravato 84 mg today as scheduled.  Tolerated it well without nausea or vomiting headache or chest pain or palpitations.  Expected dissociation gradually resolved over the 2 hour observation period. She feels 50% less depressed with Spravto and wants to continue it.   Continues meds Adderall XR 20 mg every morning, Wellbutrin XL 450 every morning, clonidine 0.1 mg twice daily, lorazepam 1 mg every 6 hours as needed. Has started nortriptyline 75 mg nightly for about 3 weeks and DT level 176, reduced to 50 mg HS early November. Still the same sx as noted last visit.  Tolerating meds.   Compliant.  Still depressed and family notices.  Has been able to participate in family interactions.  Some post-show let down and has to do detailed work which is hard for her bc ADD.  Sleep and eating well.  Energy OK but not great.  No SI.  Not cried in a year or so.  Clearly less depressed and hopeless than before the Spravato.  09/24/22 appt noted: Received Spravato 84 mg today as scheduled.  Tolerated it well without nausea or vomiting headache or chest pain or palpitations.  Expected dissociation gradually resolved over the 2 hour  observation period. She feels 50% less depressed with Spravto and wants to continue it.   Continues meds Adderall XR 20 mg every morning, Wellbutrin XL 450 every morning, clonidine 0.1 mg twice daily, lorazepam 1 mg every 6 hours as needed. Has started nortriptyline 75 mg nightly for about 3 weeks and DT level 176, reduced to 50 mg HS early November. Still the same sx as noted last visit.  Tolerating meds.   Compliant.  Still depressed and family notices.  Has been able to participate in family interactions.  Some post-show let down and has to do detailed work which is hard for her bc ADD.  Sleep and eating well.  Energy OK but not great.  No SI.  Not cried in a year or so.  Clearly less depressed and hopeless than before the Spravato. Is not making further progress generally.  Stuck with moderate depression  10/02/22 appt noted: Received Spravato 84 mg today as scheduled.  Tolerated it well without nausea or vomiting headache or chest pain or palpitations.  Expected dissociation gradually resolved over the 2 hour observation period. She feels 50% less depressed with Spravto and wants to continue it.   Continues meds Adderall XR 20 mg every morning, Wellbutrin XL 450 every morning, clonidine 0.1 mg twice daily, lorazepam 1 mg every 6 hours as needed. Has started nortriptyline 75 mg nightly for about 3 weeks and DT level 176, reduced to 50 mg HS early November. Still the same sx as noted last visit.  Tolerating meds.   Compliant.  Still depressed and family notices.  Has been able to participate in family interactions.  Some post-show let down and has to do detailed work which is hard for her bc ADD.  Sleep and eating well.  Energy OK but not great.  No SI.  Not cried in a year or so.  Clearly less depressed and hopeless than before the Spravato. Is not making further progress generally.  Stuck with moderate depression.  Is able to function pretty normally. Plan: trial pramipexole 0.25 mg BID off label  for  depression.   10/08/22 appt noted: Received Spravato 84 mg today as scheduled.  Tolerated it well without nausea or vomiting headache or chest pain or palpitations.  Expected dissociation gradually resolved over the 2 hour observation period. She feels 50% less depressed with Spravto and wants to continue it.   Continues meds Adderall XR 20 mg every morning, Wellbutrin XL 450 every morning, clonidine 0.1 mg twice daily, lorazepam 1 mg every 6 hours as needed. Has started nortriptyline 75 mg nightly for about 3 weeks and DT level 176, reduced to 50 mg HS early November. Still the same sx as noted last visit.  Tolerating meds.   Compliant.  Still depressed and family notices.  Has been able to participate in family interactions.  Some post-show let down and has to do detailed work which is hard for her bc ADD.  Sleep and eating well.  Energy OK but not great.  No SI.  Not cried in a year or so.  Clearly less depressed and hopeless than before the Spravato. Is not making further progress generally.  Stuck with moderate depression.  Behaved and felt pretty normally with family over for Thanksgiving. Doesn't see benefit or SE with pramipexole but thinks maybe it makes her worse. Plan:  increase pramipexole augmentation off label to 0.5 mg BID  10/15/2022 appointment noted: Received Spravato 84 mg today as scheduled.  Tolerated it well without nausea or vomiting headache or chest pain or palpitations.  Expected dissociation gradually resolved over the 2 hour observation period. She feels 50% less depressed with Spravto and wants to continue it.   Continues meds Adderall XR 20 mg every morning, Wellbutrin XL 450 every morning, clonidine 0.2 mg twice daily, lorazepam 1 mg every 6 hours as needed.  Trazodone 50 mg tablets 1-2 nightly as needed insomnia Has started nortriptyline 75 mg nightly for about 3 weeks and DT level 176, reduced to 50 mg HS early November. Recommended increase pramipexole 0.5 mg  twice daily off label for treatment resistant depression on 10/08/2022. She feels better motivated more active with pramipexole 0.5 mg twice daily.  She still is depressed but it is better.  We discussed the possibility of going up in the dose but did not change it.  10/22/2022 appointment noted: Received Spravato 84 mg today as scheduled.  Tolerated it well without nausea or vomiting headache or chest pain or palpitations.  Expected dissociation gradually resolved over the 2 hour observation period. She feels 50% less depressed with Spravto and wants to continue it.   Continues meds Adderall XR 20 mg every morning, Wellbutrin XL 450 every morning, clonidine 0.2 mg twice daily, lorazepam 1 mg every 8 hours as needed.  Trazodone 50 mg tablets 1-2 nightly as needed insomnia Has started nortriptyline 75 mg nightly for about 3 weeks and DT level 176, reduced to 50 mg HS early November. pramipexole 0.5 mg twice daily off label for treatment resistant depression on 10/08/2022. She is better motivated than she was.  She is journaling 3 pages a day.  She has started walking and has walked 5 days a week for 50 minutes for the last 2 weeks.  That is significantly helped her mood.  Her mood tends to be better when she interacts with family.  However she still has some periods of depression.  She is tolerating the medications well.  She still feels like her affect and confidence is not back to normal. Plan:  increase pramipexole augmentation off label to 0.5 mg tID  or 0.75 mg BID   10/29/22 appt noted: Received Spravato 84 mg today as scheduled.  Tolerated it well without nausea or vomiting headache or chest pain or palpitations.  Expected dissociation gradually resolved over the 2 hour observation period. She feels 50% less depressed with Spravto and wants to continue it.   Continues meds Adderall XR 20 mg every morning, Wellbutrin XL 450 every morning, clonidine 0.2 mg twice daily, lorazepam 1 mg every 8 hours  as needed.  Trazodone 50 mg tablets 1-2 nightly as needed insomnia Has started nortriptyline 75 mg nightly for about 3 weeks and DT level 176, reduced to 50 mg HS early November. pramipexole increased to 0.75 mg twice daily off label for treatment resistant depression  She has been feeling somewhat better with the increase in pramipexole and is tolerating it well.  She still is easily overwhelmed.  Her affect and mood can improve now when around her family or doing something positive.  She has been able to be more productive. She is tolerating the current medications. She is still considering TMS as an alternative to Spravato.  11/15/2022 appointment noted: Received Spravato 84 mg today as scheduled.  Tolerated it well without nausea or vomiting headache or chest pain or palpitations.  Expected dissociation gradually resolved over the 2 hour observation period. She feels 50% less depressed with Spravto and wants to continue it.   Continues meds Adderall XR 20 mg every morning, Wellbutrin XL 450 every morning, clonidine 0.2 mg twice daily, lorazepam 1 mg every 8 hours as needed.  Trazodone 50 mg tablets 1-2 nightly as needed insomnia Has started nortriptyline 75 mg nightly for about 3 weeks and DT level 176, reduced to 50 mg HS early November. pramipexole increased to 0.75 mg twice daily off label for treatment resistant depression  He has noticed some increase in depression due to the length of time since the last Spravato administration.  She believes Spravato is helping her.  sHe is not suicidal but has felt very blue the last few days. She is tolerating the medication. She is ambivalent about Spravato versus [place] but she is considering [place]. She wants to continue Spravato administration because it is clearly helpful.  11/19/22 appt noted: Received Spravato 84 mg today as scheduled.  Tolerated it well without nausea or vomiting headache or chest pain or palpitations.  Expected dissociation gradually  resolved over the 2 hour observation period. She feels 50% less depressed with Spravto and wants to continue it.   Continues meds Adderall XR 20 mg every morning, Wellbutrin XL 450 every morning, clonidine 0.2 mg twice daily, lorazepam 1 mg every 8 hours as needed.  Trazodone 50 mg tablets 1-2 nightly as needed insomnia Has started nortriptyline 75 mg nightly for about 3 weeks and DT level 176, reduced to 50 mg HS early November. pramipexole increased to 0.75 mg twice daily off label for treatment resistant depression  She has felt better back on Spravato more regularly.  However she still has residual depression esp when alone or when wihtout activity.  Can function when needed.   Does not have her connfidence back. She plans to pursue [place] availability. Have discussed retrying [place]  11/28/22 appt noted: Received Spravato 84 mg today as scheduled.  Tolerated it well without nausea or vomiting headache or chest pain or palpitations.  Expected dissociation gradually resolved over the 2 hour observation period. She feels 50% less depressed with Spravto and wants to continue it.   Continues meds Adderall XR  20 mg every morning, Wellbutrin XL 450 every morning, clonidine 0.2 mg twice daily, lorazepam 1 mg every 8 hours as needed.  Trazodone 50 mg tablets 1-2 nightly as needed insomnia Has started nortriptyline 75 mg nightly for about 3 weeks and DT level 176, reduced to 50 mg HS early November. pramipexole increased to 0.75 mg twice daily off label for treatment resistant depression  Last week she was more depressed than usual for reasons that are not clear.  She has not had a clear answer from Adrienne Mocha about [facility] options.  States that they have not returned her call.  She is asked about Auvelity retry in place of Wellbutrin.  Has still been walking.  12/03/22 appt noted: Received Spravato 84 mg today as scheduled.  Tolerated it well without nausea or vomiting headache or chest pain or palpitations.   Expected dissociation gradually resolved over the 2 hour observation period. She feels 50% less depressed with Spravto and wants to continue it.   Continues meds Adderall XR 20 mg every morning, Wellbutrin XL 450 every morning, clonidine 0.2 mg twice daily, lorazepam 1 mg every 8 hours as needed.  Trazodone 50 mg tablets 1-2 nightly as needed insomnia started nortriptyline 75 mg nightly for about 3 weeks and DT level 176, reduced to 50 mg HS early November. pramipexole increased to 0.75 mg twice daily off label for treatment resistant depression  No SE .  Satisfied with meds. Depression is better in the last week.  Not sure why that is the case.  Still walking daily and that helps and journaling 3 pages daily.  Working on [employer], academic.  No changes desire.  Clearly benefits from Spravato but not 100%.  Still considering TMS.  12/10/22 appt noted: Received Spravato 84 mg today as scheduled.  Tolerated it well without nausea or vomiting headache or chest pain or palpitations.  Expected dissociation gradually resolved over the 2 hour observation period. She feels 50% less depressed with Spravto and wants to continue it.   Continues meds Adderall XR 20 mg every morning, Wellbutrin XL 450 every morning, clonidine 0.2 mg twice daily, lorazepam 1 mg every 8 hours as needed.  Trazodone 50 mg tablets 1-2 nightly as needed insomnia started nortriptyline 75 mg nightly for about 3 weeks and DT level 176, reduced to 50 mg HS early November. pramipexole increased to 0.75 mg twice daily off label for treatment resistant depression  No SE .   Depression was worse this week for no apparent reason.  She struggled being positive.  She has felt more discouraged.  She has felt more anxious.  She has had a hard time doing tasks.  She is interested in retrying the [place] which we had discussed previously. Plan: She agrees to [facility].  To improve tolerability and reduce risk of side effects, Stop Wellbutrin and start  Auvelity 1 in the morning for 1 week then 1 twice daily AndReduce pramipexole 0.5 mg BID and reduce nortriptyline to 50 mg HS  12/17/22 appt noted: Received Spravato 84 mg today as scheduled.  Tolerated it well without nausea or vomiting headache or chest pain or palpitations.  Expected dissociation gradually resolved over the 2 hour observation period. She feels 50% less depressed with Spravto and wants to continue it.   Continues meds Adderall XR 20 mg every morning, stopped Wellbutrin XL 450 every morning, clonidine 0.2 mg twice daily, lorazepam 1 mg every 8 hours as needed.  Trazodone 50 mg tablets 1-2 nightly as needed insomnia  Has started nortriptyline 75 mg nightly for about 3 weeks and DT level 176, reduced to 50 mg HS early November. pramipexole 0.375 mg twice daily off label for treatment resistant depression with plan to stop Started Auvelity 1 AM Still depressed to moderate degree.  Tolerating Auvelity so far.  No other problems with meds.  Willing to give Auvelity a chance.   ECT-MADRS    Flowsheet Row Clinical Support from 08/06/2022 in [facility] from 07/04/2022 in [place] from 05/21/2022 in [place] Office Visit from 03/02/2022 in [facility]  MADRS Total Score 29 15 27 $ 46        Past Psychiatric Medication Trials: fluoxetine, duloxetine, Viibryd, lamotrigine, Pristiq, sertraline, citalopram,  Trintellix anxious and SI Wellbutrin XL 450 Auvelity 1 dose  Adderall, Adderall XR, Vyvanse, Ritalin, Strattera low dose NR Lorazepam Trazodone  Depakote,  lamotrigine cog complaints Lithium remotely Abilify 7.5  Vraylar 1.5 mg daily agitation and insomnia Rexulti insomnia Latuda 40 one dose, CO anxious and SI Seroquel XR 300 Olanzapine 10  At visit November 12, 2019. We discussed Patient developed an increasingly severe  alcohol dependence problem since her last visit in January.  She went to [facility] and has had no alcohol since then except 1 day.  She never abused stimulants but they took her off the stimulants at [facility].  Her ADD was markedly worse.  The Wellbutrin did not help the ADD.   D history lamotrigine rash at 67 yo  Review of Systems:  Review of Systems  Constitutional:  Positive for fatigue.  HENT:  Negative for congestion.   Cardiovascular:  Negative for palpitations.  Musculoskeletal:  Positive for back pain. Negative for arthralgias and joint swelling.       SP hip surgery October 2020  Neurological:  Negative for dizziness and tremors.  Psychiatric/Behavioral:  Positive for decreased concentration and dysphoric mood. Negative for agitation, behavioral problems, confusion, hallucinations, self-injury, sleep disturbance and suicidal ideas. The patient is nervous/anxious. The patient is not hyperactive.     Medications: I have reviewed the patient's current medications.  Current Outpatient Medications  Medication Sig Dispense Refill   amLODipine (NORVASC) 2.5 MG tablet Take 2.5 mg by mouth daily.     amphetamine-dextroamphetamine (ADDERALL XR) 20 MG 24 hr capsule Take 1 capsule (20 mg total) by mouth every morning. 30 capsule 0   amphetamine-dextroamphetamine (ADDERALL XR) 20 MG 24 hr capsule Take 1 capsule (20 mg total) by mouth every morning. 30 capsule 0   buPROPion (WELLBUTRIN XL) 150 MG 24 hr tablet TAKE 3 TABLETS BY MOUTH DAILY 270 tablet 1   cloNIDine (CATAPRES) 0.2 MG tablet Take 1 tablet (0.2 mg total) by mouth 2 (two) times daily. 60 tablet 2   iron polysaccharides (NIFEREX) 150 MG capsule TAKE 1 CAPSULE BY MOUTH EVERY DAY 90 capsule 1   LORazepam (ATIVAN) 1 MG tablet Take 1 tablet (1 mg total) by mouth every 8 (eight) hours as needed. for anxiety 90 tablet 0   losartan (COZAAR) 50 MG tablet Take 50 mg by mouth daily.     nebivolol (BYSTOLIC) 2.5 MG tablet Take 2.5  mg by mouth daily.     nortriptyline (PAMELOR) 25 MG capsule Take 2 capsules (50 mg total) by mouth at bedtime. 180 capsule 0   pramipexole (MIRAPEX) 0.5 MG tablet Take 1 tablet (0.5 mg total) by mouth 3 (three) times daily. 90 tablet  0   SPRAVATO, 84 MG DOSE, 28 MG/DEVICE SOPK USE 3 SPRAYS IN EACH NOSTRIL ONCE A WEEK 3 each 5   traZODone (DESYREL) 50 MG tablet TAKE 1-2 TABLETS BY MOUTH NIGHTLY AS NEEDED FOR SLEEP 180 tablet 1   No current facility-administered medications for this visit.    Medication Side Effects: None  Allergies:  Allergies  Allergen Reactions   Metronidazole Shortness Of Breath and Other (See Comments)    Heart pounding   Ferrlecit [Na Ferric Gluc Cplx In Sucrose] Other (See Comments)    Infusion reaction 05/12/2019    Past Medical History:  Diagnosis Date   ADHD    Anemia    Anxiety    Arthritis    Depression    Heart murmur    i went to see a cardiologit slast eyar  and i had zero plaque,    PONV (postoperative nausea and vomiting)    Recovering alcoholic in remission ([place])     Family History  Problem Relation Age of Onset   Atrial fibrillation Mother    CAD Father     Past Medical History, Surgical history, Social history, and Family history were reviewed and updated as appropriate.   Please see review of systems for further details on the patient's review from today.   Objective:   Physical Exam:  There were no vitals taken for this visit.  Physical Exam Constitutional:      General: She is not in acute distress. Neurological:     Mental Status: She is alert and oriented to person, place, and time.     Coordination: Coordination normal.     Gait: Gait normal.  Psychiatric:        Attention and Perception: Attention and perception normal.        Mood and Affect: Mood is anxious and depressed. Affect is not labile, angry or tearful.        Speech: Speech is not rapid and pressured, slurred or tangential.        Behavior: Behavior is not  slowed.        Thought Content: Thought content is not paranoid or delusional. Thought content does not include homicidal or suicidal ideation. Thought content does not include suicidal plan.        Cognition and Memory: Cognition normal. Memory is not impaired. She does not exhibit impaired recent memory.        Judgment: Judgment normal.     Comments: Insight intact. No auditory or visual hallucinations. No delusions.  Residual depression and anxiety about 50% better with Spravato but last week a little worse. Affect better with more spontaneity  now vs before Spravato.  less blunted  No Sui intent plan      Lab Review:     Component Value Date/Time   NA 137 01/12/2021 1430   NA 140 11/18/2018 1544   K 3.8 01/12/2021 1430   CL 108 01/12/2021 1430   CO2 22 01/12/2021 1430   GLUCOSE 94 01/12/2021 1430   BUN 14 01/12/2021 1430   BUN 20 11/18/2018 1544   CREATININE 0.82 01/12/2021 1430   CALCIUM 8.9 01/12/2021 1430   PROT 6.6 01/12/2021 1430   ALBUMIN 3.9 01/12/2021 1430   AST 12 (L) 01/12/2021 1430   ALT 11 01/12/2021 1430   ALKPHOS 46 01/12/2021 1430   BILITOT 0.5 01/12/2021 1430   GFRNONAA >60 01/12/2021 1430   GFRAA >60 09/02/2019 0249   GFRAA >60 01/27/2019 [id number]  Component Value Date/Time   WBC 4.5 01/12/2021 1430   RBC 4.32 01/12/2021 1430   HGB 12.8 01/12/2021 1430   HGB 12.9 07/17/2019 0953   HCT 38.5 01/12/2021 1430   HCT 21.9 (L) 12/25/2018 1221   PLT 272 01/12/2021 1430   PLT 286 07/17/2019 0953   MCV 89.1 01/12/2021 1430   MCH 29.6 01/12/2021 1430   MCHC 33.2 01/12/2021 1430   RDW 12.4 01/12/2021 1430   LYMPHSABS 1.4 01/12/2021 1430   MONOABS 0.4 01/12/2021 1430   EOSABS 0.0 01/12/2021 1430   BASOSABS 0.0 01/12/2021 1430    No results found for: "POCLITH", "LITHIUM"   No results found for: "PHENYTOIN", "PHENOBARB", "VALPROATE", "CBMZ"   .res Assessment: Plan:    Recurrent major depression resistant to treatment [facility])  Attention deficit  hyperactivity disorder (ADHD), predominantly inattentive type  Generalized anxiety disorder  Insomnia due to mental condition  Accelerated hypertension   She has treatment resistant major depression ongoing with 50% better with Spravato. Have  discussed some of her  abnormal behaviors last year leading to this depressive episode getting worse which she says were associated with heavy use of delta 8 and not a manic episode.  She realizes now that that was not good for her.  She stopped all use of other drugs including those available over-the-counter such as delta 8 or any other THC related products.  She is no longer having any of those types of behaviors and instead is depressed.  She is experiencing more pleasure and is less blunted and enjoying more and better inteest versus before the Spravato.  She also feels worse if misses a dose of Spravato.  However has been more depressed in the last week than she was the previous week for no apparent reason.  Patient was administered Spravato 84 mg intranasally today.  The patient experienced the typical dissociation which gradually resolved over the 2-hour period of observation.  There were no complications.  Specifically the patient did not have nausea or vomiting or headache.  Blood pressures monitored at the 40-minute and 2-hour follow-up intervals.  Borderline high.  By the time the 2-hour observation period was met the patient was alert and oriented and able to exit without assistance.  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  See nursing note for further details.She wants to continue Spravato. We discussed discussed the side effects in detail as well as the protocol required to receive Spravato.   Failed multiple antidepressants.  Many of them were not actual failures but intolerances and it is unclear whether some of that was more connected with anxiety  than true side effects.  1 example is the [place].  In general she does not want to try anything but an antidepressant but has failed all major categories of antidepressants except TCAs and MAO inhibitors which have not been tried until now. There is a consistent pattern of thinking she is worse with medication which is not consistent with objective evidence.    Her self assessment is clouded by her depression.  Started nortriptyline 75 mg nightly.  Serum level 176.  So Reduced to 50 mg HS  Discussed side effects in detail.  Needs more time to help. Extensive discussion previously about her ambivalence about meds and missatributing sx of depression as SE of meds.    She agrees to [facility].  Continue 1 AM and when tolerated then increase to BID Stop pramipexole.  Started Spravato 84 mg twice weekly on 03/16/2022.  Now on weekly administration  Adderall  XR 20 mg AM   Ativan 1 mg 3 times daily as needed anxiety but try to cut it back. Is not ideal to use benzodiazepine with stimulant but because of the severity of her symptoms it has been necessary.  Hope to eventually eliminate the benzodiazepine.  Expected as her depression improves her anxiety will improve as well.  However lately her anxiety has been unmanageable.  We will expect that to improve as the depression improves.  She has headed insturctions to reduce this.  Continue clonidine 0.2 mg BID off label for anxiety and helps BP partially. BP is better controlled but not consistent.  Consider increasing amlodipine.  Also on losartan 50  Discussed potential benefits, risks, and side effects of stimulants with patient to include increased heart rate, palpitations, insomnia, increased anxiety, increased irritability, or decreased appetite.  Instructed patient to contact office if experiencing any significant tolerability issues. She wants to return to usual dose of Adderall for ADD bc of mor poor cognitive function with reduction.  Also  discussed that depression will impair cognitive function.  [facility] consultation was initially denied .  She says she called [facility] about this but they have not scheduled an appt .Marland Kitchen   Consider consultation with [facility] if Idelle Crouch is not an option.  We discussed the pros and cons of this versus Spravato.  For now she wants to continue Spravato because it is clearly helpful.  Extensive discussion about her chronic ambivalence about psychiatric medicines and tendency to blame medicine for the symptoms of depression that she had prior to even starting the medications.  After discussing this at length she was able to acknowledge that that is factual.  She does not appear to be having any significant side effects with the current medications.  However so far not much benefit with nortriptyline despite adequate level.  Has Maintained sobriety  FU with Spravato weekly   Lynder Parents, MD, DFAPA     Please see After Visit Summary for patient specific instructions.  Future Appointments  Date Time Provider [place]  12/31/2022  9:30 AM Cottle, Billey Co., MD CP-CP None  12/31/2022  9:30 AM CP-NURSE [facility] None                  No orders of the defined types were placed in this encounter.      -------------------------------

## 2022-12-31 ENCOUNTER — Encounter: Payer: 59 | Admitting: Psychiatry

## 2023-01-01 ENCOUNTER — Ambulatory Visit: Payer: 59

## 2023-01-01 ENCOUNTER — Ambulatory Visit (INDEPENDENT_AMBULATORY_CARE_PROVIDER_SITE_OTHER): Payer: 59 | Admitting: Psychiatry

## 2023-01-01 ENCOUNTER — Other Ambulatory Visit: Payer: Self-pay

## 2023-01-01 VITALS — BP 131/62 | HR 82

## 2023-01-01 DIAGNOSIS — F900 Attention-deficit hyperactivity disorder, predominantly inattentive type: Secondary | ICD-10-CM | POA: Diagnosis not present

## 2023-01-01 DIAGNOSIS — I10 Essential (primary) hypertension: Secondary | ICD-10-CM

## 2023-01-01 DIAGNOSIS — F339 Major depressive disorder, recurrent, unspecified: Secondary | ICD-10-CM | POA: Diagnosis not present

## 2023-01-01 DIAGNOSIS — F411 Generalized anxiety disorder: Secondary | ICD-10-CM

## 2023-01-01 DIAGNOSIS — F5105 Insomnia due to other mental disorder: Secondary | ICD-10-CM

## 2023-01-01 MED ORDER — LORAZEPAM 1 MG PO TABS: 1.0000 mg | ORAL_TABLET | Freq: Three times a day (TID) | ORAL | 0 refills | Status: DC | PRN

## 2023-01-01 MED ORDER — PRAMIPEXOLE DIHYDROCHLORIDE 0.5 MG PO TABS: 0.5000 mg | ORAL_TABLET | Freq: Two times a day (BID) | ORAL | 0 refills | Status: DC

## 2023-01-01 NOTE — Progress Notes (Signed)
NURSES NOTE:   Patient arrived for her weekly 51m Spravato treatment. Pt is being treated for Treatment Resistant Depression, pt will be receiving 84 mg which will continue to be her maintenance dose, she receives weekly treatments.  [place] Patient arrived and taken to treatment room. Confirmed she had a ride home which is her husband would be coming back to pick up pt when done and sometimes she needs to use UMelburn Popperif he is unable to pick her up. Pt's Spravato is ordered through [employer] delivered to office, all Spravato medication is stored at doctors office per REMS/FDA guidelines. The medication is required to be locked behind two doors per FDA/REMS Protocol. Medication is also disposed of properly per regulations.   Began taking patient's vital signs at 2:30 PM 121/92, pulse 92. Pulse Ox 99%. Gave patient first dose 28 mg nasal spray, each nasal spray administered in each nostril as directed and waited 5 minutes between the second and third dose. All 3 doses given pt did not complain of any nausea/vomiting, given a cup of water due to the taste after the administration of Spravato.  She listens to [employer] with spa or relaxing music.  Checked 40 minute vitals at 4:00 PM, 90/60, pulse 76, Pulse Ox 92%. Explained she would be monitored for a total time of 120 minutes. Discharge vitals were taken at 4:40 PM 92/64 P 65, 99% Pulse Ox. BLesle Chris NP met with pt today and discussed her medication. I walked pt to elevator, where her husband met her for the ride home. Recommend she go home and sleep or just relax on the couch. No driving, no intense activities. Verbalized understanding. Nurse was with pt a total of 70 minutes for clinical. Pt is scheduled next Monday, February 19th 2024. Pt instructed to call office with any problems or questions.      LOT [id number] JAN 2027

## 2023-01-01 NOTE — Progress Notes (Signed)
Laura Chang [id number] 1956-05-21 67 y.o.  Subjective:   Patient ID:  Laura Chang is a 67 y.o. (DOB 06/23/1956) female.  Chief Complaint:  Chief Complaint  Patient presents with   Follow-up   Depression   Anxiety   Fatigue   ADD      Laura Chang presents to the office today for follow-up of depression and anxiety and ADD.  seen November 12, 2019.  Melted down in 2020.  Went to [employer] in July.  No withdrawal.  1 drink since then.  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 She was on no stimulant and no SSRI but was taking Strattera and Wellbutrin.  The following changes were made. Stop Strattera. OK restart stimulant bc severe ADD Restart Adderall 1 daily for a few days and if tolerated then restart 1 twice daily. If not tolerated reduce the dosage if needed. May need to stop Wellbutrin if not tolerating the stimulant.  Yes.  DC Wellbutrin Restart Prozac 20 mg daily.  February 2021 appointment with the following noted: Completed grant proposal.  Couldn't doit without Adderall.  Sold a bunch of work.   Adderall XR lasts about 3 pm.  Strength seems about right.  BP been OK.  Not jittery.   Stopped Wellbutrin but had no SE. Mood drastically better with grant proposal and back on fluoxetine.  Less depressed and lethargic.  No anxiety.  Cut back on coffee. Started back with devotions and stronger faith. Plan: Continue Prozac 20 mg daily. May have to increase the dose at some point in the future given that she usually was taking higher dosages but she is getting good response at this time. Restart Wellbutrin off label for ADD since can't get 2 ADDERALL daily. 150 mg daily then 300 mg daily. She can adjust the dose between 150 mg and 300 mg daily to get the optimal effect.   05/11/2020 appointment with the following noted: Has been inconsistent with Prozac and Wellbutrin. Not sure of the effect of Wellbutrin. Biggest deterrent in work is  anxiety.  Some of the work is conceptual and difficult at times.  Can feel she's not up to a project at times.  Overall is OK but would like a steadier benefit from stimulant.  Exhausted from managing concentration and keeping up with things from the day.  Loses things.  Not good keeping up with schedule. Overall productive and emotionally OK. Can feel Adderall wear off. Mood is better in summer and worse in the winter.   F died in 10-15-2023 and that is a loss. No SE Wellbutrin. Still attends AA meetings.  Real benefit from [place] last year. Recognizes effect of anemia on ADD and mood.  Had iron infusions last winter. Plan:  Wellbutrin off label for ADD since can't get 2 ADDERALL daily. 150 mg daily then 300 mg daily.  01/24/2021 appointment with following noted: Doing a program called [employer] mindfulness app since Xmas.  CBT app helped the depression.  App helped her focus better.  Lost sign weight. Writing a lot. Before Xmas felt depressed and started negative thinking worse, self denigrating. Not drinking. More isolated.   Recognizes mo is narcissist.    Didn't tell anyone she was born until 3 mos later.  M aloof and uninterested in pt.  Lied about her birthday.  Mo lack of affection even with pt's kids. Going to [place] for a year and it helped her to quit drinking. Also misses  kids being gone with a hole also. Plan: No med changes  05/04/2021 appointment with the following noted: Therapist Paula Pile thinks she's manic. Lost weight to 144#.   States she is still sleeping okay.  Admits she is hyper and recognizes that she is likely manic.  She feels great, euphoric with an increased sense of spiritual connectedness to God.  She has racing thoughts and talks fast and talks a lot and this is noted by her husband.  He thinks she is a bit hyper.  She has been able to maintain sobriety although she will have 1 glass of wine on special occasions but does not drink by herself.  She is not  drinking to excess.  She denies any dangerous impulsivity.  She is clearly not depressed and not particularly anxious.  She has no concerns about her medication and she has been compliant.  06/16/21 appt noted: So much better.  Going through a lot but the manic thing happened on top of it.  So much slower.  Didn't feel like losing anything with risperidone.  Likes the Adderalll at 10 mg. Some drowsiness in the AM and very drowsy from risperidone 2 mg HS. Prayer life is better. Handling stress better. Less depressed with risperidone. Still likes trazodone. Sleeps well. Plan: Reduce Prozac to 10 mg daily.  Consider stopping it because it can feel the mania however she is reluctant to do that because she fears relapse of depression. Reduce risperidone to 1.5 mg nightly due to side effects.  Discussed risk of worsening mania.  07/25/2021 appointment with the following noted: Misses the Adderall and hard to function without it. Depressed now. Heavy chest.  Anxious and guilty.  Body feels heavy.   Hates Wellbutrin.   Plan: Increase fluoxetine to 20 mg daily Add Abilify 1/2 of 15 mg tablet daily Wean wellbutrin by 1 tablet each week  bc she feels it is not helpful and DT polypharmacy Reduce risperidone to 1 daily for 1 week and stop it. Disc risk of mania. Increase Adderall to XR 20 mg AM  08/08/21 Much less depressed and starting to feel normal I feel a lot better. No SE.  Speech normal off risperidone. Sleeping OK on trazaodone and enough.   Noticed benefit from Adderall again. Plan: continue fluoxetine to 20 mg daily Continue Abilify 1/2 of 15 mg tablet daily for depression and mania continue Adderall to XR 20 mg AM  10/10/2021 phone call: Pt stated she feels like the Abilify should be decreased to 5mg.She said she is depressed but rational and not suicidal.She has an appt Monday and can wait until then if you prefer. MD response: Reduce the Abilify to 7.5 mg every other day.  We will meet on  10/16/2021 and decide what to do from there.  10/16/2021 appointment with the following noted: More depressed.  Most depressed I've ever been.  Just numb.  Sense of grief.   Thinks the manic episode was unlike anything else she ever had.  Doesn't want to medicate against it.  Don't enjoy people.  Easily overwhelmed.  Had some death thoughts but not suicidal.  Has been functional.  Feels better today after reducing Abilify to every other day but she is only been doing that for 3 days. A/P: Episode of post manic depression was explained. continue fluoxetine to 20 mg daily Hold Abilify for 1 week then resume Abilify 1/2 of 15 mg tablet every other day for depression and mania continue Adderall to XR 20 mg   AM  10/27/2021 appointment with the following noted: I'm doing so much better.  Handling the depression better. Better self talk and spiritual focus has helped.   Dep 6/10 manifesting as anxiety with low confidence.   F died 2  years ago and M 67 yo and is dependent . She is working hard to feel better but still feels depressed.  She almost feels like she has a little more anxiety since restarting Abilify every other day. Plan: continue fluoxetine to 20 mg daily DC Abilify .  Vrayalar 1.5 mg QOD to try to get rid of depression ASAP. continue Adderall to XR 20 mg AM  11/10/2021 appointment with the following noted: Busy with Xmas and it was fun with family but then a big let down.  Did well with it.  Functioned well with it.  Working hard on things with depression.  Not shutting down. Not sure but feels better today but yesterday was hard.  Difficulty dealing with mother.  She won't do anything to help herself.  Yesterday with her all day.  Won't do PT and has isolated herself.    Lack of confidence.   No SE with Vraylar.  11/24/21 urgent appointment appt noted: More and more depressed.   So anxious and doesn't want to be alone but can do so. No appetite. Hurts inside. Has had some fleeting  suicidal thoughts but would not act on them.  Tolerating meds. Has been consistent with Vraylar 1.5 mg every other day, fluoxetine 20 mg daily Plan: Increase Vraylar to 1.5 mg daily Change Prozac to Trintellix 10 mg daily. Discussed side effects of each continue Adderall to XR 20 mg AM  12/27/2021 appointment with the following noted: Not OK.  I feel less depressed but feels bat shit. Not sleeping well.  Extremely anxious. Off and on sleep. 3-4 hours of sleep.   Still having daily SI.  But also become obvious has so much to do.  Overwhelmed by tasks.   Needs anxiety meds to just function. Not more motivated.  Walked yesterday.   Feels afraid like in trouble but not irritable or angry. DC DT agitation Vraylar to 1.5 mg daily Change Prozac to Trintellix 10 mg daily. Hold Adderall to XR 20 mg AM Clonidine 0.1 1/2 tablet twice daily for 2 days and if needed for anxiety and sleep increase to 1 twice daily Ok temporary Ativan 1 mg 3 times daily as needed anxiety  01/05/22 appt noted: Off fluoxetine and  Trintellix.  Only on Ativan, trazodone and Adderall XR 20 plus added clonidine 0.1 mg BID Didn't think she needed to start Trintellix. Not taking Ativan.   Didn't like herself last week. Feels some better today. Wonders if the manic sx Not agitated.  Anxiety kind of calmed down.  A lot to be anxious about situationally.  $ stress. Concerns about downers with meds. Can't access normal personality. ? Lethargy and inability to talk as sE. Plan: Latuda 20-40 mg daily with food. Adderall to XR 20 mg AM Clonidine 0.1 1/2 tablet twice daily  reduce dose to be sure no SE Ok temporary Ativan 1 mg 3 times daily as needed anxiety  01/19/22 appt noted: Taking Latuda 20 mg daily.  Took 40 mg once and felt anxious and  SI Still depressed and not very reactive Anxiety mainly about the depression and fears of the future. She wants to revisit manic sx and thinks it was maybe bc taking delta 8 bc was  taking a lot of   it so still doesn't think she's classic bipolar. She wants to only take Prozac bc thinks Latuda is perpetuating depression. Says the delta 8 was very psychaedelic.  When not taking it was not manic.  Sleeping ok again.  Plan: Per her request DC Latuda 20-40 mg daily with food. She wants to continue Prozac alone AMA  Adderall to XR 20 mg AM Clonidine 0.1 1/2 tablet twice daily  reduce dose to be sure no SE Ok temporary Ativan 1 mg 3 times daily as needed anxiety  01/23/2022 phone call complaining of increased anxiety since stopping Latuda.  She will try increasing clonidine.  01/26/2022 phone call not feeling well and wanted to restart the Vraylar.  However notes indicate that had made her agitated therefore she was encouraged to pick up samples of Rexulti 1 mg and start that instead.  02/06/2022 phone call: Stating she felt the Rexulti was helping with depression but she was not sleeping well and obsessing over things.  She was encouraged to increase Rexulti to 2 mg daily and increase trazodone for sleep.  02/09/2022 appointment with the following noted: This was an urgent work in appointment No sleep last night with trazodone 100 mg HS Nothing really better depression or anxiety. Ruminating negative anxious thoughts. Did not tolerate Rexulti because it was causing insomnia.  Does not think it helped depression.  Lacks emotion that she should have.  Lacks her usual personality.  Some hopeless thoughts.  Some death thoughts.  Some suicidal thoughts without plan or intent Plan: DC Rexulti and Prozac & DC trazodone Adderall to XR 20 mg AM Clonidine 0.1 1/tablet twice daily  reduce dose to be sure no SE Ok temporary Ativan 1 mg 3 times daily as needed anxiety Start Seroquel XR 150 mg nightly  03/02/2022 appointment: Langley Gauss called back a few days after starting Seroquel stating it was making her more anxious and more depressed.  This seemed unlikely as this medicine rarely ever  causes anxiety.  She stopped the medication waited 3 days and called back still had anxiety and depression but thought perhaps the anxiety was a little better.  She did not want to take the Seroquel. She knew about the option of Spravato and wanted to pursue that. Now questions whether to return to Seroquel while waiting to start Spravato bc feels just as bad without it and knows she didn't give it enough time to work.   MADRS 46  ECT-MADRS    Flowsheet Row Clinical Support from 08/06/2022 in [place] from 07/04/2022 in [place] from 05/21/2022 in [place] Office Visit from 03/02/2022 in [facility]  MADRS Total Score 29 15 27 $ 46      03/14/22 appt noted: Pt received Spravato 56 mg first dose today with some dissociative sx which were not severe.  She was anxious prior to the administration and felt better after receiving lorazepam 1 mg.  No NV, or HA. Wants to continue Spravato. Ongoing depression and desperate to feel better.  I'm not myself DT deprsssion which is most severe in recent history.  Anhedonia.  Low motivation.  Social avoidance. Continues to think all recent med trials are making her worse.  Sleep ok with Seroquel.  03/16/22 appt noted: Received Spravato 84 mg for the first time.  some dissociative sx which were not severe.  She was anxious prior to the administration and felt better after receiving lorazepam  1 mg.  No NV, or HA. Wants to continue Spravato.   Does not feel any better or different since the last appt.  Ongoing depression.  Ongoing depression and desperate to feel better.  I'm not myself DT deprsssion which is most severe in recent history.  Anhedonia.  Low motivation.  Social avoidance. Continues to think all recent med trials are making her worse.  Sleep ok with Seroquel.  Does not want to continue Seroquel for  TRD.  03/20/2022 appointment noted: Came for Spravato administration today.  However blood pressure was significantly elevated approximately 180/115.  She was given lorazepam 1 mg and clonidine 0.2 mg to try to get it down. She states she regretted stopping the Seroquel XR 300 mg tablets.  She now realizes it was helpful.  She did not sleep much at all last night.  She did not take the Adderall this morning. 2 to 3 hours after arrival blood pressure was still elevated at  170/110, 62 pulse.  For Spravato administration was canceled for today.  She admits to being anxious and depressed.  She is not suicidal.  She is highly motivated to receive the Spravato.  We discussed getting it tomorrow.  03/22/2022 appointment noted: Patient's blood pressure was never stable enough yesterday in order to get her in for Spravato administration.  She was encouraged to see her primary care doctor.  It is better today.  03/26/2022 appointment with the following noted: Blood pressure was better.  Saw her primary care doctor who started on oral Bystolic 2.5 mg daily. Received Spravato 84 mg today as scheduled.  Tolerated it well without nausea or vomiting headache or chest pain or palpitations.  Her blood pressure was borderline but manageable. She remains depressed and anxious.  She is ambivalent about the medicine and desperate to get to feel better.  Continues to have anhedonia and low energy and low motivation and reduced ability to do things.  Less social.  Not suicidal.  03/28/22 appt noted: Received Spravato 84 mg today as scheduled.  Tolerated it well without nausea or vomiting headache or chest pain or palpitations.  Her blood pressure was borderline but manageable. Has not seen any improvement so far.  Tolerating Seroquel.  Inconsistent with Bystolic and BP has been borderline high. Still depressed and anxious and anhedonia.  Low motivation, energy, productivity. Taking quetiapine and tolerating XR 300 mg  nightly.  04/04/22 appt noted: Received Spravato 84 mg today as scheduled.  Tolerated it well without nausea or vomiting headache or chest pain or palpitations.  Her blood pressure was borderline but manageable. Has not seen any improvement so far.  Tolerating Seroquel.   She still tends to think that the medications are making her worse.  She has said this about each of the recent psychiatric medicines including Seroquel.  However her husband thinks she is improved.  She also admits there is some improvement in productivity.  She still feels highly anxious.  She still does not enjoy things as normal.  She still feels desperate to improve as soon as possible. Has been taking Seroquel XR since 03/20/2022  04/10/22 appt noted: Received Spravato 84 mg today as scheduled.  Tolerated it well without nausea or vomiting headache or chest pain or palpitations.  Her blood pressure was borderline but manageable. Has not seen any improvement so far.  Tolerating Seroquel.  Doesn't like Seroquel bc she thinks it flattens here. Ongoing depression without confidence Plan: Start Auvelity 1 every morning for persistent treatment resistant  depression  04/12/2022 appointment with the following noted: Received Spravato 84 mg today as scheduled.  Tolerated it well without nausea or vomiting headache or chest pain or palpitations.  Her blood pressure was borderline but manageable. Has not seen any improvement so far.  Tolerating Seroquel.  Doesn't like Seroquel bc she thinks it flattens her. Received Spravato 84 mg today as scheduled.  Tolerated it well without nausea or vomiting headache or chest pain or palpitations.  Her blood pressure was borderline but manageable. Has not seen any improvement so far.  Tolerating Seroquel.  Doesn't like Seroquel bc she thinks it flattens here.  We discussed her ambivalence about it. She is starting Auvelity and has tolerated it the last 2 days without side effect.  She still does not  feel like herself and feels flat and not enjoying things with suppressed expressed emotion  04/17/2022 appointment with the following noted: Received Spravato 84 mg today as scheduled.  Tolerated it well without nausea or vomiting headache or chest pain or palpitations.  Her blood pressure was borderline but manageable. Has not seen any improvement so far.  Tolerating Seroquel.  Doesn't like Seroquel bc she thinks it flattens her. She has been tolerating the Auvelity 1 in the morning without side effects for about a week.  She has not noticed significant improvement so far.  She still feels depressed and flat and not herself.  Other people notice that she is flat emotionally.  She is not suicidal.  She does feel discouraged that she is not getting better yet.  04/19/2022 appointment noted: Has increased Auvelity to 1 twice daily for 2 days, continues quetiapine XR 300 mg nightly, clonidine 0.3 mg twice daily, lorazepam 1 mg twice daily for anxiety and Adderall XR 20 mg in the morning. No obious SE but she still thinks quetiapine XR is making her feel down.  But not sedated Received Spravato 84 mg today as scheduled.  Tolerated it well without nausea or vomiting headache or chest pain or palpitations.  Her blood pressure was borderline but manageable. She still feels quite anxious and feels it necessary to take both the clonidine and lorazepam twice a day to manage her anxiety.  She has been consistently down and flat and not herself until yesterday afternoon she noted an improvement in mood and feeling much more like herself with her normal personality reemerging.  She was quite depressed in the morning with very dark negative thoughts.  She did not have those dark negative thoughts this morning.  She had a lot of questions about medication and when she was expecting to be improved and why she has not shown improvement up to now.  04/23/22 appt noted: Has increased Auvelity to 1 twice daily for 1 week,  continues quetiapine XR 300 mg nightly, clonidine 0.3 mg twice daily, lorazepam 1 mg twice daily for anxiety and Adderall XR 20 mg in the morning. No obious SE but she still thinks quetiapine XR is making her feel down.  But not sedated Received Spravato 84 mg today as scheduled.  Tolerated it well without nausea or vomiting headache or chest pain or palpitations.  She is still depressed but admits better function and is able to enjoy social interactions. Tolerating meds.  Would like to feel better for sure. Not herself.  Flat. Plan increase Auvelity to 1 tab BID as planned and reduce Quetiapine to 1/2 of ER 300 mg  bc NR for depression.  04/25/2022 appointment with the following noted: clonidine 0.3  mg twice daily, lorazepam 1 mg twice daily for anxiety and Adderall XR 20 mg in the morning. Seroquel XR 300 HS No obious SE but she still thinks quetiapine XR is making her feel down.  But not sedated Received Spravato 84 mg today as scheduled.  Tolerated it well without nausea or vomiting headache or chest pain or palpitations.  Called yesterday with more anxiety.  Had increased Auvelity for 1 day and reduced Seroquel XR for 1 day.  Felt restless and fearful  05/01/2022 appointment noted: clonidine 0.3 mg twice daily, lorazepam 1 mg twice daily for anxiety and Adderall XR 20 mg in the morning. Seroquel XR 150 HS, Auvelity 1 BID Received Spravato 84 mg today as scheduled.  Tolerated it well without nausea or vomiting headache or chest pain or palpitations.  Nurse has noted patient has called multiple times sometimes asking the same question repeatedly.  It is unclear whether she is truly forgetful or is just anxious seeking reassurance. Patient acknowledges ongoing depression as well as some anxiety but states she has felt a little better in the last couple of days.  She has reduced the Seroquel to 150 mg at night and has increased Auvelity to 1 twice daily but only for 1 day.  So far she seems to be  tolerating it.  05/03/22 appt noted: clonidine 0.2 mg twice daily, lorazepam 1 mg twice daily for anxiety and Adderall XR 20 mg in the morning. Seroquel XR 150 HS, Auvelity 1 BID BP high this am about 170/100 and received extra clonidine 0.2 mg and came to receive Spravato.  Not dizzy, no SOB, nor CP but BP is still high Could not receive Spravato today bc BP high and pulse low at 30 ppm. Still depressed and anxious. Plan: continue trial Auvelity with Spravato She needs to get BP and pulse managed  05/08/22 TC: RTC  H Michael NA and mailbox full.  Could not leave message.  Pt  -  talked to she and H on speaker. H worried over wife.  Vacant stare.  Slurs words at times.  Not smiling. Reduced enjoyment.  Depression.  Withdrawn from usual activities.  Some irritability.  Anxious. Disc her concerns meds are making her worse.  Extensive discussion about her treatment resistant status.  There is a consistent pattern of not taking the medicines long enough to get benefit because she believes the meds are making her worse.  However the symptoms she describes as side effects are exactly the same symptoms that she had prior to taking the medication RX for  the depression.  So it is not clear that these are actual side effects. This is true about the 2 most recent meds including Seroquel and Auvelity.  Recommend psychiatric consultation in hopes of improving her comfort level with taking prescribed medications for a sufficient length of time to provide benefit. Extensive discussion about ECT is the treatment of choice for treatment resistant depression.  Spravato may work if she can comply with consistency.  There are medication options but they take longer to work.   Plan:  Reduce clonidine to 0.1 mg BID DT bradycardia.  Talk with PCP about BP and low pulse problems which are interfering with her consistent compliance with Spravato.   Limit lorazepam to 3 -4  mg daily max. Excess use is the cause of slurring  speech.  She must stop excess use or will have to stop the med. Stop Auvelity per her request.  But she has only been  on the full dose for a little over a week and clearly has not had time to get benefit from it.  She thinks maybe it is making her more anxious. Reduce Seroquel from 150XR to 50 -100 mg at night IR.  She couldn't sleep when stopped it completely. Will not start new antidepressant until her SE issues are resolved or not. Get second psych opinion from Yehuda Budd MD or another psychiatrist.  H's sister is therapist in [place] [facility], MD, [facility]  05/16/2022 appointment with the following noted: Received Spravato 84 mg today as scheduled.  Tolerated it well without nausea or vomiting headache or chest pain or palpitations.  She stopped Auvelity as discussed last week. On her own, without physician input, she restarted Wellbutrin XL 450 mg every morning today.  She had taken it in the past.  She feels jittery and anxious. She feels less depressed than she did last week.  But she is still depressed without her usual range of affect.  She still is less social and less motivated than normal. Her primary care doctor increased the dose of losartan Plan: Stop Seroquel Reduce Wellbutrin XL to 300 mg every morning.  Starting the dose at 450 every morning is likely causing side effects of jitteriness and it should not be started at that have a dosage. Recommend she not change meds on her own without MDM put  05/23/2022 appointment with the following noted: Received Spravato 84 mg today as scheduled.  Tolerated it well without nausea or vomiting headache or chest pain or palpitations.  Has not dropped seroquel XR 300 mg 1/2 tablet nightly bc couldn't sleep without it. Has not tried lower dose quetiapine 50 mg HS Still feels depressed.   BP is better managed so far, just saw PCP.  BP is better today and infact is low today. Dropped clonidine as directed from 0.3 mg BID bc inadequate control  of BP to 0.2 mg BID.  However she wants to increase it back to 0.3 mg twice daily because she feels it helped her anxiety better.  Wonders about increasing Wellbutrin for depression.  However she has only been on 300 mg a day for a week.  She was on 450 mg daily in the past.  06/06/22 appt noted: Received Spravato 84 mg today as scheduled.  Tolerated it well without nausea or vomiting headache or chest pain or palpitations.  She is still depressed and anxious.  She wants to try to stop the Seroquel but cannot sleep without some of it.  She is taking lorazepam 1 mg 4 times daily and still having a lot of anxiety.  She wants to increase clonidine back to 0.3 mg twice daily.  She hopes for more improvement She recently went for a second psychiatric opinion as suggested the results of that are pending.  06/11/22 appt noted: Received Spravato 84 mg today as scheduled.  Tolerated it well without nausea or vomiting headache or chest pain or palpitations.  She is still depressed and anxious. Without much change.  Still hopeless, anhedonia, reduced inteterest and motivation.  Tolerating meds. Disc concerns Spravato is not hleping much. Plan: stop Seroquel and start olanzapine 10 mg HS for TRD and anxiety.  06/13/2022 appointment noted: Received Spravato 84 mg today as scheduled.  Tolerated it well without nausea or vomiting headache or chest pain or palpitations.  She is still depressed and anxious. Without much change.  Still hopeless, anhedonia, reduced inteterest and motivation.  Tolerating meds. Disc concerns Spravato  is not helping much as hoped but is improving a bit in the last week. Tolerating meds. Continues Wellbutrin XL 450 AM, tolerating recently started olanzapine  10 mg HS. Sleep is good.   Pending appt with [place] consult.  06/18/22 appt noted: Received Spravato 84 mg today as scheduled.  Tolerated it well without nausea or vomiting headache or chest pain or palpitations.  Tolerating  meds. Continues Wellbutrin XL 450 AM, tolerating recently started olanzapine  10 mg HS. Continues Adderall XR 20 amd and has tried to reduce lorazepam to 64m TID Sleep is good.   Pending appt with [facility]. Depression is a little bit better in the last week with a little improvement in emotional expression and interest.  She is pushing herself to be more active.  Her daughter thought she was a little better than she has been.  However she is still depressed and still not her normal self with anhedonia and reduced emotional expressiveness.  06/20/22 appt noted: Received Spravato 84 mg today as scheduled.  Tolerated it well without nausea or vomiting headache or chest pain or palpitations.  Tolerating meds with a little sleepiness. Continues Wellbutrin XL 450 AM, tolerating recently started olanzapine  10 mg HS. Continues Adderall XR 20 amd and has tried to reduce lorazepam to 17mTID Sleep is good.   Mood is improving.  Better funciton.  Anxiety is better with olanzapine. Still not herself and depression not gone with some anhedonia and social avoidance and feeling overwhelmed.  8/14 2023 received Spravato 84 mg 06/27/2022 received Spravato Spravato 84 mg 07/02/2022 received Spravato 84 mg 07/04/2022 received Spravato 84 mg  07/09/2022 appointment noted: Received Spravato 84 mg today as scheduled.  Tolerated it well without nausea or vomiting headache or chest pain or palpitations.  Expected dissociation and feels less depressed with resolution of negative emotions immediately after Spravato and then depression, anxiety creep back in. Continues meds Adderall XR 20 mg every morning, Wellbutrin XL 450 every morning, clonidine 0.1 mg twice daily, lorazepam 1 mg every 6 hours as needed, olanzapine increased from 7.5 to 10 mg nightly on  Tolerating meds.  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  She does note some increased appetite and is somewhat concerned about that but has not gained significant amounts of weight. [place] [facility] which was [place] but she knows it can be appealed.  However because she is improving with Spravato plus the other medications now she wants to continue the current treatment plan.  07/18/22 appt noted: Continues meds Adderall XR 20 mg every morning, Wellbutrin XL 450 every morning, clonidine 0.1 mg twice daily, lorazepam 1 mg every 6 hours as needed, olanzapine increased from 7.5 to 10 mg nightly on 07/04/2022. Received Spravato 84 mg today as scheduled.  Tolerated it well without nausea or vomiting headache or chest pain or palpitations.  Expected dissociation and feels less depressed with resolution of negative emotions immediately after Spravato and then depression, anxiety creep back in. Continues meds Adderall XR 20 mg every morning, Wellbutrin XL 450 every morning, clonidine 0.1 mg twice daily, lorazepam 1 mg every 6 hours as needed, olanzapine increased from 7.5 to 10 mg nightly on  Tolerating meds.  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  She does note some increased appetite and is somewhat concerned about that but has not gained significant amounts of weight. She has had the [place] consultation which was initially denied but she knows it can be appealed. She continues to have chronic ambivalence about psychiatric medicines and initially tends to blame her depressive symptoms such as decreased concentration and feeling flat on what ever medicine she currently is taking even though she had the same symptoms before the current medicines were started.  Then after discussion she does admit that her depressive symptoms are improved since adding olanzapine but still has those residual symptoms  noted.  07/23/22 received Spravato 84 mg   07/30/2022 appointment noted: Received Spravato 84 mg today as scheduled.  Tolerated it well without nausea or vomiting headache or chest pain or palpitations.  Expected dissociation and feels less depressed with resolution of negative emotions immediately after Spravato and then depression, anxiety creep back in. Continues meds Adderall XR 20 mg every morning, Wellbutrin XL 450 every morning, clonidine 0.1 mg twice daily, lorazepam 1 mg every 6 hours as needed, olanzapine increased from 7.5 to 10 mg nightly on  She has been inconsistent with olanzapine because she continues to be ambivalent about the medications in general and thinks that perhaps the 10 mg is making her feel blunted.  She continues to feel some depression.  She had a good day this week and but still feels somewhat depressed and persistently anxious. Plan: be consistent with olanzapine 10 mg HS for TRD and longer trial for potential benefit for anxiety.  Has not taken it consistently.  08/06/2022 appointment noted: Received Spravato 84 mg today as scheduled.  Tolerated it well without nausea or vomiting headache or chest pain or palpitations.  Expected dissociation and feels less depressed with resolution of negative emotions immediately after Spravato and then depression, anxiety creep back in. Continues meds Adderall XR 20 mg every morning, Wellbutrin XL 450 every morning, clonidine 0.1 mg twice daily, lorazepam 1 mg every 6 hours as needed, olanzapine i 10 mg nightly  She continues to feel depressed but is about 50% better with Spravato.  She is still not herself.  She still has anhedonia.  She still is not her able to engage socially in the typical ways.  She is not jovial and outgoing like normal.  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  She continues to feel that her  personality is dampened down.  There is a question about whether it is related to depression or medication. Plan: continue olanzapine 10 for longer trial for TRD and severe anxiety.  08/13/22 appt noted:  Received Spravato 84 mg today as scheduled.  Tolerated it well without nausea or vomiting headache or chest pain or palpitations.  Expected dissociation and feels less depressed with resolution of negative emotions immediately after Spravato and then depression, anxiety creep back in. Continues meds Adderall XR 20 mg every morning, Wellbutrin XL 450 every morning, clonidine 0.1 mg twice daily, lorazepam 1 mg every 6 hours as needed, olanzapine i 10 mg nightly  She still does not feel herself.  Still struggles with depression and low motivation and reduced social engagement and reduced interest and reduced emotional expression.  She is somewhat better with the medicines plus Spravato.  She still believes the Spravato makes her blunted and is not sure how much it helps her anxiety.  She can have good days when her family is around and she is engaged.  She still wants to stop the olanzapine. She has apparently continued to take the trazodone despite having been told to stop it when she started olanzapine.  She feels like she needs the trazodone. Plan: DC olanzapine and Start nortriptyline 25 mg nightly and build up to 75 mg nightly and then check blood level.    08/27/2022 appointment noted: Received Spravato 84 mg today as scheduled.  Tolerated it well without nausea or vomiting headache or chest pain or palpitations.  Expected dissociation and feels less depressed with resolution of negative emotions immediately after Spravato and then depression, anxiety creep back in. Continues meds Adderall XR 20 mg every morning, Wellbutrin XL 450 every morning, clonidine 0.1 mg twice daily, lorazepam 1 mg every 6 hours as needed. Stopped olanzapine and started nortriptyline which she has taken for about a week is 75  mg nightly. So far she is tolerating the nortriptyline well with the exception of some dry mouth and constipation which she is working to manage.  She does not feel substantially better better or different off the olanzapine.  No change in her sleep which is good.  Main concern currently in addition to the residual depression is anxiety which is somewhat situational with pending arch show.  She is worrying about it more than normal.  Says she is having to take lorazepam twice a day where she had been able to keep reduce it prior to this.  She still does not feel like herself with residual depression with less social interest and less of her usual buoyancy in personality.  She is flatter than normal.  Overall she still feels that the Spravato has been helpful at reducing the severity of the depression.  She is not suicidal. She has not heard anything about the [facility] [place] appeal as of yet.  09/05/2022 appointment noted: Received Spravato 84 mg today as scheduled.  Tolerated it well without nausea or vomiting headache or chest pain or palpitations.  Expected dissociation and feels less depressed with resolution of negative emotions immediately after Spravato and then depression, anxiety creep back in. Continues meds Adderall XR 20 mg every morning, Wellbutrin XL 450 every morning, clonidine 0.1 mg twice daily, lorazepam 1 mg every 6 hours as needed. Stopped olanzapine and started nortriptyline which she has taken for about 2 week is 75 mg nightly. Initially blood pressure was a little high causing delay in starting Spravato.  She admitted to feeling a little wound up.  She still experiences a little increase in depression if she goes longer than a week in between doses of Spravato.  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  09/10/22 appt noted: Received Spravato 84 mg today as scheduled.  Tolerated it well  without nausea or vomiting headache or chest pain or palpitations.  Expected dissociation gradually resolved over the 2 hour observation period. She feels 50% less depressed with Spravto and wants to continue it.   Continues meds Adderall XR 20 mg every morning, Wellbutrin XL 450 every morning, clonidine 0.1 mg twice daily, lorazepam 1 mg every 6 hours as needed. Has started nortriptyline 75 mg nightly for about 3 weeks. Has not seen a significant difference with the addition of nortriptyline.  Tolerating it pretty well. She continues to have some degree of anhedonia and significant depression and  anxiety.  Her daughters noticed that she is more needy and calls more frequently.  She acknowledges this as well.  She is clearly still not herself. Plan: pramipexole off label and RX 0.25 mg BID  09/17/2022 appointment noted: Received Spravato 84 mg today as scheduled.  Tolerated it well without nausea or vomiting headache or chest pain or palpitations.  Expected dissociation gradually resolved over the 2 hour observation period. She feels 50% less depressed with Spravto and wants to continue it.   Continues meds Adderall XR 20 mg every morning, Wellbutrin XL 450 every morning, clonidine 0.1 mg twice daily, lorazepam 1 mg every 6 hours as needed. Has started nortriptyline 75 mg nightly for about 3 weeks and DT level 176, reduced to 50 mg HS early November. Still the same sx as noted last visit.  Tolerating meds.   Compliant.  Still depressed and family notices.  Has been able to participate in family interactions.  Some post-show let down and has to do detailed work which is hard for her bc ADD.  Sleep and eating well.  Energy OK but not great.  No SI.  Not cried in a year or so.  Clearly less depressed and hopeless than before the Spravato.  09/24/22 appt noted: Received Spravato 84 mg today as scheduled.  Tolerated it well without nausea or vomiting headache or chest pain or palpitations.  Expected  dissociation gradually resolved over the 2 hour observation period. She feels 50% less depressed with Spravto and wants to continue it.   Continues meds Adderall XR 20 mg every morning, Wellbutrin XL 450 every morning, clonidine 0.1 mg twice daily, lorazepam 1 mg every 6 hours as needed. Has started nortriptyline 75 mg nightly for about 3 weeks and DT level 176, reduced to 50 mg HS early November. Still the same sx as noted last visit.  Tolerating meds.   Compliant.  Still depressed and family notices.  Has been able to participate in family interactions.  Some post-show let down and has to do detailed work which is hard for her bc ADD.  Sleep and eating well.  Energy OK but not great.  No SI.  Not cried in a year or so.  Clearly less depressed and hopeless than before the Spravato. Is not making further progress generally.  Stuck with moderate depression  10/02/22 appt noted: Received Spravato 84 mg today as scheduled.  Tolerated it well without nausea or vomiting headache or chest pain or palpitations.  Expected dissociation gradually resolved over the 2 hour observation period. She feels 50% less depressed with Spravto and wants to continue it.   Continues meds Adderall XR 20 mg every morning, Wellbutrin XL 450 every morning, clonidine 0.1 mg twice daily, lorazepam 1 mg every 6 hours as needed. Has started nortriptyline 75 mg nightly for about 3 weeks and DT level 176, reduced to 50 mg HS early November. Still the same sx as noted last visit.  Tolerating meds.   Compliant.  Still depressed and family notices.  Has been able to participate in family interactions.  Some post-show let down and has to do detailed work which is hard for her bc ADD.  Sleep and eating well.  Energy OK but not great.  No SI.  Not cried in a year or so.  Clearly less depressed and hopeless than before the Spravato. Is not making further progress generally.  Stuck with moderate depression.  Is able to function pretty  normally. Plan: trial pramipexole 0.25 mg  BID off label for depression.   10/08/22 appt noted: Received Spravato 84 mg today as scheduled.  Tolerated it well without nausea or vomiting headache or chest pain or palpitations.  Expected dissociation gradually resolved over the 2 hour observation period. She feels 50% less depressed with Spravto and wants to continue it.   Continues meds Adderall XR 20 mg every morning, Wellbutrin XL 450 every morning, clonidine 0.1 mg twice daily, lorazepam 1 mg every 6 hours as needed. Has started nortriptyline 75 mg nightly for about 3 weeks and DT level 176, reduced to 50 mg HS early November. Still the same sx as noted last visit.  Tolerating meds.   Compliant.  Still depressed and family notices.  Has been able to participate in family interactions.  Some post-show let down and has to do detailed work which is hard for her bc ADD.  Sleep and eating well.  Energy OK but not great.  No SI.  Not cried in a year or so.  Clearly less depressed and hopeless than before the Spravato. Is not making further progress generally.  Stuck with moderate depression.  Behaved and felt pretty normally with family over for Thanksgiving. Doesn't see benefit or SE with pramipexole but thinks maybe it makes her worse. Plan:  increase pramipexole augmentation off label to 0.5 mg BID  10/15/2022 appointment noted: Received Spravato 84 mg today as scheduled.  Tolerated it well without nausea or vomiting headache or chest pain or palpitations.  Expected dissociation gradually resolved over the 2 hour observation period. She feels 50% less depressed with Spravto and wants to continue it.   Continues meds Adderall XR 20 mg every morning, Wellbutrin XL 450 every morning, clonidine 0.2 mg twice daily, lorazepam 1 mg every 6 hours as needed.  Trazodone 50 mg tablets 1-2 nightly as needed insomnia Has started nortriptyline 75 mg nightly for about 3 weeks and DT level 176, reduced to 50 mg HS  early November. Recommended increase pramipexole 0.5 mg twice daily off label for treatment resistant depression on 10/08/2022. She feels better motivated more active with pramipexole 0.5 mg twice daily.  She still is depressed but it is better.  We discussed the possibility of going up in the dose but did not change it.  10/22/2022 appointment noted: Received Spravato 84 mg today as scheduled.  Tolerated it well without nausea or vomiting headache or chest pain or palpitations.  Expected dissociation gradually resolved over the 2 hour observation period. She feels 50% less depressed with Spravto and wants to continue it.   Continues meds Adderall XR 20 mg every morning, Wellbutrin XL 450 every morning, clonidine 0.2 mg twice daily, lorazepam 1 mg every 8 hours as needed.  Trazodone 50 mg tablets 1-2 nightly as needed insomnia Has started nortriptyline 75 mg nightly for about 3 weeks and DT level 176, reduced to 50 mg HS early November. pramipexole 0.5 mg twice daily off label for treatment resistant depression on 10/08/2022. She is better motivated than she was.  She is journaling 3 pages a day.  She has started walking and has walked 5 days a week for 50 minutes for the last 2 weeks.  That is significantly helped her mood.  Her mood tends to be better when she interacts with family.  However she still has some periods of depression.  She is tolerating the medications well.  She still feels like her affect and confidence is not back to normal. Plan:  increase pramipexole augmentation off label  to 0.5 mg tID or 0.75 mg BID   10/29/22 appt noted: Received Spravato 84 mg today as scheduled.  Tolerated it well without nausea or vomiting headache or chest pain or palpitations.  Expected dissociation gradually resolved over the 2 hour observation period. She feels 50% less depressed with Spravto and wants to continue it.   Continues meds Adderall XR 20 mg every morning, Wellbutrin XL 450 every morning,  clonidine 0.2 mg twice daily, lorazepam 1 mg every 8 hours as needed.  Trazodone 50 mg tablets 1-2 nightly as needed insomnia Has started nortriptyline 75 mg nightly for about 3 weeks and DT level 176, reduced to 50 mg HS early November. pramipexole increased to 0.75 mg twice daily off label for treatment resistant depression  She has been feeling somewhat better with the increase in pramipexole and is tolerating it well.  She still is easily overwhelmed.  Her affect and mood can improve now when around her family or doing something positive.  She has been able to be more productive. She is tolerating the current medications. She is still considering TMS as an alternative to Spravato.  11/15/2022 appointment noted: Received Spravato 84 mg today as scheduled.  Tolerated it well without nausea or vomiting headache or chest pain or palpitations.  Expected dissociation gradually resolved over the 2 hour observation period. She feels 50% less depressed with Spravto and wants to continue it.   Continues meds Adderall XR 20 mg every morning, Wellbutrin XL 450 every morning, clonidine 0.2 mg twice daily, lorazepam 1 mg every 8 hours as needed.  Trazodone 50 mg tablets 1-2 nightly as needed insomnia Has started nortriptyline 75 mg nightly for about 3 weeks and DT level 176, reduced to 50 mg HS early November. pramipexole increased to 0.75 mg twice daily off label for treatment resistant depression  He has noticed some increase in depression due to the length of time since the last Spravato administration.  She believes Spravato is helping her.  sHe is not suicidal but has felt very blue the last few days. She is tolerating the medication. She is ambivalent about Spravato versus [facility] but she is considering [facility]. She wants to continue Spravato administration because it is clearly helpful.  11/19/22 appt noted: Received Spravato 84 mg today as scheduled.  Tolerated it well without nausea or vomiting headache or  chest pain or palpitations.  Expected dissociation gradually resolved over the 2 hour observation period. She feels 50% less depressed with Spravto and wants to continue it.   Continues meds Adderall XR 20 mg every morning, Wellbutrin XL 450 every morning, clonidine 0.2 mg twice daily, lorazepam 1 mg every 8 hours as needed.  Trazodone 50 mg tablets 1-2 nightly as needed insomnia Has started nortriptyline 75 mg nightly for about 3 weeks and DT level 176, reduced to 50 mg HS early November. pramipexole increased to 0.75 mg twice daily off label for treatment resistant depression  She has felt better back on Spravato more regularly.  However she still has residual depression esp when alone or when wihtout activity.  Can function when needed.   Does not have her connfidence back. She plans to pursue [facility] availability. Have discussed retrying [facility]  11/28/22 appt noted: Received Spravato 84 mg today as scheduled.  Tolerated it well without nausea or vomiting headache or chest pain or palpitations.  Expected dissociation gradually resolved over the 2 hour observation period. She feels 50% less depressed with Spravto and wants to continue it.  Continues meds Adderall XR 20 mg every morning, Wellbutrin XL 450 every morning, clonidine 0.2 mg twice daily, lorazepam 1 mg every 8 hours as needed.  Trazodone 50 mg tablets 1-2 nightly as needed insomnia Has started nortriptyline 75 mg nightly for about 3 weeks and DT level 176, reduced to 50 mg HS early November. pramipexole increased to 0.75 mg twice daily off label for treatment resistant depression  Last week she was more depressed than usual for reasons that are not clear.  She has not had a clear answer from Adrienne Mocha about [facility] options.  States that they have not returned her call.  She is asked about Auvelity retry in place of Wellbutrin.  Has still been walking.  12/03/22 appt noted: Received Spravato 84 mg today as scheduled.  Tolerated it well  without nausea or vomiting headache or chest pain or palpitations.  Expected dissociation gradually resolved over the 2 hour observation period. She feels 50% less depressed with Spravto and wants to continue it.   Continues meds Adderall XR 20 mg every morning, Wellbutrin XL 450 every morning, clonidine 0.2 mg twice daily, lorazepam 1 mg every 8 hours as needed.  Trazodone 50 mg tablets 1-2 nightly as needed insomnia started nortriptyline 75 mg nightly for about 3 weeks and DT level 176, reduced to 50 mg HS early November. pramipexole increased to 0.75 mg twice daily off label for treatment resistant depression  No SE .  Satisfied with meds. Depression is better in the last week.  Not sure why that is the case.  Still walking daily and that helps and journaling 3 pages daily.  Working on [employer], academic.  No changes desire.  Clearly benefits from Spravato but not 100%.  Still considering TMS.  12/10/22 appt noted: Received Spravato 84 mg today as scheduled.  Tolerated it well without nausea or vomiting headache or chest pain or palpitations.  Expected dissociation gradually resolved over the 2 hour observation period. She feels 50% less depressed with Spravto and wants to continue it.   Continues meds Adderall XR 20 mg every morning, Wellbutrin XL 450 every morning, clonidine 0.2 mg twice daily, lorazepam 1 mg every 8 hours as needed.  Trazodone 50 mg tablets 1-2 nightly as needed insomnia started nortriptyline 75 mg nightly for about 3 weeks and DT level 176, reduced to 50 mg HS early November. pramipexole increased to 0.75 mg twice daily off label for treatment resistant depression  No SE .   Depression was worse this week for no apparent reason.  She struggled being positive.  She has felt more discouraged.  She has felt more anxious.  She has had a hard time doing tasks.  She is interested in retrying the [place] which we had discussed previously. Plan: She agrees to [facility].  To improve  tolerability and reduce risk of side effects, Stop Wellbutrin and start Auvelity 1 in the morning for 1 week then 1 twice daily AndReduce pramipexole 0.5 mg BID and reduce nortriptyline to 50 mg HS  12/17/22 appt noted: Received Spravato 84 mg today as scheduled.  Tolerated it well without nausea or vomiting headache or chest pain or palpitations.  Expected dissociation gradually resolved over the 2 hour observation period. She feels 50% less depressed with Spravto and wants to continue it.   Continues meds Adderall XR 20 mg every morning, stopped Wellbutrin XL 450 every morning, clonidine 0.2 mg twice daily, lorazepam 1 mg every 8 hours as needed.  Trazodone 50 mg tablets 1-2  nightly as needed insomnia Has started nortriptyline 75 mg nightly for about 3 weeks and DT level 176, reduced to 50 mg HS early November. pramipexole 0.375 mg twice daily off label for treatment resistant depression with plan to stop Started Auvelity 1 AM Still depressed to moderate degree.  Tolerating Auvelity so far.  No other problems with meds.  Willing to give Auvelity a chance. Plan: She agrees to [facility].  Continue 1 AM and when tolerated then increase to BID Stop pramipexole.  12/26/22 received Spravato  01/01/23 appt noted: Received Spravato 84 mg today as scheduled.  Tolerated it well without nausea or vomiting headache or chest pain or palpitations.  Expected dissociation gradually resolved over the 2 hour observation period. She feels 50% less depressed with Spravto and wants to continue it.   Continues meds Adderall XR 20 mg every morning, stopped Wellbutrin XL 450 every morning, clonidine 0.2 mg twice daily, lorazepam 1 mg every 8 hours as needed.  Trazodone 50 mg tablets 1-2 nightly as needed insomnia Has started nortriptyline 75 mg nightly for about 3 weeks and DT level 176, reduced to 50 mg HS early November. Forgot to reduce pramipexole stoll taking  0.5 mg twice daily off label for treatment  resistant depression  Started Auvelity 1 AM & PM last week. Occ misses Spravato DT htn.this week a better than last.   Painting again more and it helps mood.  Confidence still low and not as likely to socialize as normal but enjoys family.   Still ambivalent about [place]. Tolerating meds.  Including the increase in [place].      ECT-MADRS    Flowsheet Row Clinical Support from 08/06/2022 in [place] from 07/04/2022 in [facility] [place] from 05/21/2022 in [place] Office Visit from 03/02/2022 in [facility]  MADRS Total Score 29 15 27 $ 46        Past Psychiatric Medication Trials: fluoxetine, duloxetine, Viibryd, lamotrigine, Pristiq, sertraline, citalopram,  Trintellix anxious and SI Wellbutrin XL 450 Auvelity 1 dose  Adderall, Adderall XR, Vyvanse, Ritalin, Strattera low dose NR Lorazepam Trazodone  Depakote,  lamotrigine cog complaints Lithium remotely Abilify 7.5  Vraylar 1.5 mg daily agitation and insomnia Rexulti insomnia Latuda 40 one dose, CO anxious and SI Seroquel XR 300 Olanzapine 10  At visit November 12, 2019. We discussed Patient developed an increasingly severe alcohol dependence problem since her last visit in January.  She went to [facility] and has had no alcohol since then except 1 day.  She never abused stimulants but they took her off the stimulants at [facility].  Her ADD was markedly worse.  The Wellbutrin did not help the ADD.   D history lamotrigine rash at 67 yo  Review of Systems:  Review of Systems  Constitutional:  Positive for fatigue.  HENT:  Negative for congestion.   Cardiovascular:  Negative for palpitations.  Musculoskeletal:  Positive for back pain. Negative for arthralgias and joint swelling.       SP hip surgery October 2020  Neurological:  Negative for dizziness and tremors.   Psychiatric/Behavioral:  Positive for decreased concentration and dysphoric mood. Negative for agitation, behavioral problems, confusion, hallucinations, self-injury, sleep disturbance and suicidal ideas. The patient is nervous/anxious. The patient is not hyperactive.        Forgetful at times about med recommendations.    Medications: I have reviewed the patient's current medications.  Current  Outpatient Medications  Medication Sig Dispense Refill   amLODipine (NORVASC) 2.5 MG tablet Take 2.5 mg by mouth daily.     amphetamine-dextroamphetamine (ADDERALL XR) 20 MG 24 hr capsule Take 1 capsule (20 mg total) by mouth every morning. 30 capsule 0   amphetamine-dextroamphetamine (ADDERALL XR) 20 MG 24 hr capsule Take 1 capsule (20 mg total) by mouth every morning. 30 capsule 0   buPROPion (WELLBUTRIN XL) 150 MG 24 hr tablet TAKE 3 TABLETS BY MOUTH DAILY 270 tablet 1   cloNIDine (CATAPRES) 0.2 MG tablet Take 1 tablet (0.2 mg total) by mouth 2 (two) times daily. 60 tablet 2   iron polysaccharides (NIFEREX) 150 MG capsule TAKE 1 CAPSULE BY MOUTH EVERY DAY 90 capsule 1   losartan (COZAAR) 50 MG tablet Take 50 mg by mouth daily.     nebivolol (BYSTOLIC) 2.5 MG tablet Take 2.5 mg by mouth daily.     nortriptyline (PAMELOR) 25 MG capsule Take 2 capsules (50 mg total) by mouth at bedtime. 180 capsule 0   SPRAVATO, 84 MG DOSE, 28 MG/DEVICE SOPK USE 3 SPRAYS IN EACH NOSTRIL ONCE A WEEK 3 each 5   traZODone (DESYREL) 50 MG tablet TAKE 1-2 TABLETS BY MOUTH NIGHTLY AS NEEDED FOR SLEEP 180 tablet 1   LORazepam (ATIVAN) 1 MG tablet Take 1 tablet (1 mg total) by mouth every 8 (eight) hours as needed. for anxiety 90 tablet 0   pramipexole (MIRAPEX) 0.5 MG tablet Take 1 tablet (0.5 mg total) by mouth 2 (two) times daily. 60 tablet 0   No current facility-administered medications for this visit.    Medication Side Effects: None  Allergies:  Allergies  Allergen Reactions   Metronidazole Shortness Of Breath  and Other (See Comments)    Heart pounding   Ferrlecit [Na Ferric Gluc Cplx In Sucrose] Other (See Comments)    Infusion reaction 05/12/2019    Past Medical History:  Diagnosis Date   ADHD    Anemia    Anxiety    Arthritis    Depression    Heart murmur    i went to see a cardiologit slast eyar  and i had zero plaque,    PONV (postoperative nausea and vomiting)    Recovering alcoholic in remission ([place])     Family History  Problem Relation Age of Onset   Atrial fibrillation Mother    CAD Father     Past Medical History, Surgical history, Social history, and Family history were reviewed and updated as appropriate.   Please see review of systems for further details on the patient's review from today.   Objective:   Physical Exam:  There were no vitals taken for this visit.  Physical Exam Constitutional:      General: She is not in acute distress. Neurological:     Mental Status: She is alert and oriented to person, place, and time.     Coordination: Coordination normal.     Gait: Gait normal.  Psychiatric:        Attention and Perception: Attention and perception normal.        Mood and Affect: Mood is anxious and depressed. Affect is not labile, angry or tearful.        Speech: Speech is not rapid and pressured, slurred or tangential.        Behavior: Behavior is not slowed.        Thought Content: Thought content is not paranoid or delusional. Thought content does not include homicidal or  suicidal ideation. Thought content does not include suicidal plan.        Cognition and Memory: Cognition normal. Memory is not impaired. She does not exhibit impaired recent memory.        Judgment: Judgment normal.     Comments: Insight intact. No auditory or visual hallucinations. No delusions.  Residual depression and anxiety about 50% better with Spravato but last week a little worse. Affect better with more spontaneity  now vs before Spravato.  less blunted  No [place] intent  plan      Lab Review:     Component Value Date/Time   NA 137 01/12/2021 1430   NA 140 11/18/2018 1544   K 3.8 01/12/2021 1430   CL 108 01/12/2021 1430   CO2 22 01/12/2021 1430   GLUCOSE 94 01/12/2021 1430   BUN 14 01/12/2021 1430   BUN 20 11/18/2018 1544   CREATININE 0.82 01/12/2021 1430   CALCIUM 8.9 01/12/2021 1430   PROT 6.6 01/12/2021 1430   ALBUMIN 3.9 01/12/2021 1430   AST 12 (L) 01/12/2021 1430   ALT 11 01/12/2021 1430   ALKPHOS 46 01/12/2021 1430   BILITOT 0.5 01/12/2021 1430   GFRNONAA >60 01/12/2021 1430   GFRAA >60 09/02/2019 0249   GFRAA >60 01/27/2019 0811       Component Value Date/Time   WBC 4.5 01/12/2021 1430   RBC 4.32 01/12/2021 1430   HGB 12.8 01/12/2021 1430   HGB 12.9 07/17/2019 0953   HCT 38.5 01/12/2021 1430   HCT 21.9 (L) 12/25/2018 1221   PLT 272 01/12/2021 1430   PLT 286 07/17/2019 0953   MCV 89.1 01/12/2021 1430   MCH 29.6 01/12/2021 1430   MCHC 33.2 01/12/2021 1430   RDW 12.4 01/12/2021 1430   LYMPHSABS 1.4 01/12/2021 1430   MONOABS 0.4 01/12/2021 1430   EOSABS 0.0 01/12/2021 1430   BASOSABS 0.0 01/12/2021 1430    No results found for: "POCLITH", "LITHIUM"   No results found for: "PHENYTOIN", "PHENOBARB", "VALPROATE", "CBMZ"   .res Assessment: Plan:    Recurrent major depression resistant to treatment ([place]) - Plan: pramipexole (MIRAPEX) 0.5 MG tablet  Attention deficit hyperactivity disorder (ADHD), predominantly inattentive type  Generalized anxiety disorder - Plan: LORazepam (ATIVAN) 1 MG tablet  Insomnia due to mental condition - Plan: LORazepam (ATIVAN) 1 MG tablet  Accelerated hypertension   She has treatment resistant major depression ongoing with 50% better with Spravato. Have  discussed some of her  abnormal behaviors last year leading to this depressive episode getting worse which she says were associated with heavy use of delta 8 and not a manic episode.  She realizes now that that was not good for her.  She  stopped all use of other drugs including those available over-the-counter such as delta 8 or any other THC related products.  She is no longer having any of those types of behaviors and instead is depressed.  She is experiencing more pleasure and is less blunted and enjoying more and better inteest versus before the Spravato.  She also feels worse if misses a dose of Spravato.  However has been more depressed in the last week than she was the previous week for no apparent reason.  Patient was administered Spravato 84 mg intranasally today.  The patient experienced the typical dissociation which gradually resolved over the 2-hour period of observation.  There were no complications.  Specifically the patient did not have nausea or vomiting or headache.  Blood pressures monitored at the  40-minute and 2-hour follow-up intervals.  Borderline high.  By the time the 2-hour observation period was met the patient was alert and oriented and able to exit without assistance.  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  See nursing note for further details.She wants to continue Spravato. We discussed discussed the side effects in detail as well as the protocol required to receive Spravato.   Failed multiple antidepressants.  Many of them were not actual failures but intolerances and it is unclear whether some of that was more connected with anxiety than true side effects.  1 example is the [place].  In general she does not want to try anything but an antidepressant but has failed all major categories of antidepressants except TCAs and MAO inhibitors which have not been tried until now. There is a consistent pattern of thinking she is worse with medication which is not consistent with objective evidence.    Her self assessment is clouded by her depression.  Started nortriptyline 75 mg nightly.  Serum level 176.  So Reduced to 50  mg HS  Discussed side effects in detail.  Needs more time to help. Extensive discussion previously about her ambivalence about meds and missatributing sx of depression as SE of meds.    She is tolerating Auvelity.  Continue 1 AM and when tolerated then increase to BID Stop pramipexole.  She keeps forgetting to do this.  Started Spravato 84 mg twice weekly on 03/16/2022.  Now on weekly administration  Adderall  XR 20 mg AM   Ativan 1 mg 3 times daily as needed anxiety but try to cut it back. Is not ideal to use benzodiazepine with stimulant but because of the severity of her symptoms it has been necessary.  Hope to eventually eliminate the benzodiazepine.  Expected as her depression improves her anxiety will improve as well.  However lately her anxiety has been unmanageable.  We will expect that to improve as the depression improves.  She has headed insturctions to reduce this.  Continue clonidine 0.2 mg BID off label for anxiety and helps BP partially. BP is better controlled but not consistent.  Consider increasing amlodipine.  Also on losartan 50  Discussed potential benefits, risks, and side effects of stimulants with patient to include increased heart rate, palpitations, insomnia, increased anxiety, increased irritability, or decreased appetite.  Instructed patient to contact office if experiencing any significant tolerability issues. She wants to return to usual dose of Adderall for ADD bc of mor poor cognitive function with reduction.  Also discussed that depression will impair cognitive function.  [facility] consultation was initially denied .  She says she called [facility] about this but they have not scheduled an appt .Marland Kitchen   Consider consultation with [facility] if Id[facility] Crouch is not an option.  We discussed the pros and cons of this versus Spravato.  For now she wants to continue Spravato because it is clearly helpful.  Extensive discussion about her chronic ambivalence about psychiatric medicines and  tendency to blame medicine for the symptoms of depression that she had prior to even starting the medications.  After discussing this at length she was able to acknowledge that that is factual.  She does not appear to be having any significant side effects with the current medications.  However so far not much benefit with nortriptyline despite adequate level.  Has Maintained sobriety  FU with Spravato weekly   Lynder Parents, MD, DFAPA  Please see After Visit Summary for patient specific instructions.  Future Appointments  Date Time Provider [place]  01/07/2023  9:30 AM Cottle, Billey Co., MD CP-CP None  01/07/2023  9:30 AM CP-NURSE [facility] None  01/14/2023  9:30 AM Cottle, Billey Co., MD CP-[facility] None  01/14/2023  9:30 AM CP-NURSE CP-CP None                  No orders of the defined types were placed in this encounter.      -------------------------------

## 2023-01-04 ENCOUNTER — Encounter: Payer: Self-pay | Admitting: Psychiatry

## 2023-01-07 ENCOUNTER — Encounter: Payer: 59 | Admitting: Psychiatry

## 2023-01-08 ENCOUNTER — Encounter: Payer: Self-pay | Admitting: Psychiatry

## 2023-01-08 ENCOUNTER — Encounter: Payer: 59 | Admitting: Psychiatry

## 2023-01-08 ENCOUNTER — Ambulatory Visit (INDEPENDENT_AMBULATORY_CARE_PROVIDER_SITE_OTHER): Payer: 59 | Admitting: Psychiatry

## 2023-01-08 ENCOUNTER — Ambulatory Visit: Payer: 59

## 2023-01-08 VITALS — BP 90/51 | HR 72

## 2023-01-08 DIAGNOSIS — F5105 Insomnia due to other mental disorder: Secondary | ICD-10-CM

## 2023-01-08 DIAGNOSIS — I10 Essential (primary) hypertension: Secondary | ICD-10-CM

## 2023-01-08 DIAGNOSIS — F411 Generalized anxiety disorder: Secondary | ICD-10-CM

## 2023-01-08 DIAGNOSIS — F339 Major depressive disorder, recurrent, unspecified: Secondary | ICD-10-CM | POA: Diagnosis not present

## 2023-01-08 DIAGNOSIS — F900 Attention-deficit hyperactivity disorder, predominantly inattentive type: Secondary | ICD-10-CM | POA: Diagnosis not present

## 2023-01-08 MED ORDER — PRAMIPEXOLE DIHYDROCHLORIDE 0.5 MG PO TABS: 0.5000 mg | ORAL_TABLET | Freq: Three times a day (TID) | ORAL | 0 refills | Status: DC

## 2023-01-08 NOTE — Progress Notes (Addendum)
WEATHERLY LUVIANO [id number] 1956/02/12 67 y.o.  Subjective:   Patient ID:  Laura Chang is a 67 y.o. (DOB Mar 18, 1956) female.  Chief Complaint:  Chief Complaint  Patient presents with   Follow-up   Depression   Anxiety   Fatigue   Medication Reaction      Laura Chang presents to the office today for follow-up of depression and anxiety and ADD.  seen November 12, 2019.  Melted down in 2020.  Went to [employer] in July.  No withdrawal.  1 drink since then.  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 She was on no stimulant and no SSRI but was taking Strattera and Wellbutrin.  The following changes were made. Stop Strattera. OK restart stimulant bc severe ADD Restart Adderall 1 daily for a few days and if tolerated then restart 1 twice daily. If not tolerated reduce the dosage if needed. May need to stop Wellbutrin if not tolerating the stimulant.  Yes.  DC Wellbutrin Restart Prozac 20 mg daily.  February 2021 appointment with the following noted: Completed grant proposal.  Couldn't doit without Adderall.  Sold a bunch of work.   Adderall XR lasts about 3 pm.  Strength seems about right.  BP been OK.  Not jittery.   Stopped Wellbutrin but had no SE. Mood drastically better with grant proposal and back on fluoxetine.  Less depressed and lethargic.  No anxiety.  Cut back on coffee. Started back with devotions and stronger faith. Plan: Continue Prozac 20 mg daily. May have to increase the dose at some point in the future given that she usually was taking higher dosages but she is getting good response at this time. Restart Wellbutrin off label for ADD since can't get 2 ADDERALL daily. 150 mg daily then 300 mg daily. She can adjust the dose between 150 mg and 300 mg daily to get the optimal effect.   05/11/2020 appointment with the following noted: Has been inconsistent with Prozac and Wellbutrin. Not sure of the effect of Wellbutrin. Biggest  deterrent in work is anxiety.  Some of the work is conceptual and difficult at times.  Can feel she's not up to a project at times.  Overall is OK but would like a steadier benefit from stimulant.  Exhausted from managing concentration and keeping up with things from the day.  Loses things.  Not good keeping up with schedule. Overall productive and emotionally OK. Can feel Adderall wear off. Mood is better in summer and worse in the winter.   F died in 2023/09/27 and that is a loss. No SE Wellbutrin. Still attends AA meetings.  Real benefit from [place] last year. Recognizes effect of anemia on ADD and mood.  Had iron infusions last winter. Plan:  Wellbutrin off label for ADD since can't get 2 ADDERALL daily. 150 mg daily then 300 mg daily.  01/24/2021 appointment with following noted: Doing a program called [employer] mindfulness app since Xmas.  CBT app helped the depression.  App helped her focus better.  Lost sign weight. Writing a lot. Before Xmas felt depressed and started negative thinking worse, self denigrating. Not drinking. More isolated.   Recognizes mo is narcissist.    Didn't tell anyone she was born until 3 mos later.  M aloof and uninterested in pt.  Lied about her birthday.  Mo lack of affection even with pt's kids. Going to [place] for a year and it helped her to quit drinking. Also  misses kids being gone with a hole also. Plan: No med changes  05/04/2021 appointment with the following noted: Therapist [name] thinks she's manic. Lost weight to 144#.   States she is still sleeping okay.  Admits she is hyper and recognizes that she is likely manic.  She feels great, euphoric with an increased sense of spiritual connectedness to God.  She has racing thoughts and talks fast and talks a lot and this is noted by her husband.  He thinks she is a bit hyper.  She has been able to maintain sobriety although she will have 1 glass of wine on special occasions but does not drink by  herself.  She is not drinking to excess.  She denies any dangerous impulsivity.  She is clearly not depressed and not particularly anxious.  She has no concerns about her medication and she has been compliant.  06/16/21 appt noted: So much better.  Going through a lot but the manic thing happened on top of it.  So much slower.  Didn't feel like losing anything with risperidone.  Likes the Adderalll at 10 mg. Some drowsiness in the AM and very drowsy from risperidone 2 mg HS. Prayer life is better. Handling stress better. Less depressed with risperidone. Still likes trazodone. Sleeps well. Plan: Reduce Prozac to 10 mg daily.  Consider stopping it because it can feel the mania however she is reluctant to do that because she fears relapse of depression. Reduce risperidone to 1.5 mg nightly due to side effects.  Discussed risk of worsening mania.  07/25/2021 appointment with the following noted: Misses the Adderall and hard to function without it. Depressed now. Heavy chest.  Anxious and guilty.  Body feels heavy.   Hates Wellbutrin.   Plan: Increase fluoxetine to 20 mg daily Add Abilify 1/2 of 15 mg tablet daily Wean wellbutrin by 1 tablet each week  bc she feels it is not helpful and DT polypharmacy Reduce risperidone to 1 daily for 1 week and stop it. Disc risk of mania. Increase Adderall to XR 20 mg AM  08/08/21 Much less depressed and starting to feel normal I feel a lot better. No SE.  Speech normal off risperidone. Sleeping OK on trazaodone and enough.   Noticed benefit from Adderall again. Plan: continue fluoxetine to 20 mg daily Continue Abilify 1/2 of 15 mg tablet daily for depression and mania continue Adderall to XR 20 mg AM  10/10/2021 phone call: Pt stated she feels like the Abilify should be decreased to '5mg'$ .She said she is depressed but rational and not suicidal.She has an appt Monday and can wait until then if you prefer. MD response: Reduce the Abilify to 7.5 mg every other  day.  We will meet on 10/16/2021 and decide what to do from there.  10/16/2021 appointment with the following noted: More depressed.  Most depressed I've ever been.  Just numb.  Sense of grief.   Thinks the manic episode was unlike anything else she ever had.  Doesn't want to medicate against it.  Don't enjoy people.  Easily overwhelmed.  Had some death thoughts but not suicidal.  Has been functional.  Feels better today after reducing Abilify to every other day but she is only been doing that for 3 days. A/P: Episode of post manic depression was explained. continue fluoxetine to 20 mg daily Hold Abilify for 1 week then resume Abilify 1/2 of 15 mg tablet every other day for depression and mania continue Adderall to XR 20  mg AM  10/27/2021 appointment with the following noted: I'm doing so much better.  Handling the depression better. Better self talk and spiritual focus has helped.   Dep 6/10 manifesting as anxiety with low confidence.   F died 2  years ago and M 67 yo and is dependent . She is working hard to feel better but still feels depressed.  She almost feels like she has a little more anxiety since restarting Abilify every other day. Plan: continue fluoxetine to 20 mg daily DC Abilify .  Vrayalar 1.5 mg QOD to try to get rid of depression ASAP. continue Adderall to XR 20 mg AM  11/10/2021 appointment with the following noted: Busy with Xmas and it was fun with family but then a big let down.  Did well with it.  Functioned well with it.  Working hard on things with depression.  Not shutting down. Not sure but feels better today but yesterday was hard.  Difficulty dealing with mother.  She won't do anything to help herself.  Yesterday with her all day.  Won't do PT and has isolated herself.    Lack of confidence.   No SE with Vraylar.  11/24/21 urgent appointment appt noted: More and more depressed.   So anxious and doesn't want to be alone but can do so. No appetite. Hurts  inside. Has had some fleeting suicidal thoughts but would not act on them.  Tolerating meds. Has been consistent with Vraylar 1.5 mg every other day, fluoxetine 20 mg daily Plan: Increase Vraylar to 1.5 mg daily Change Prozac to Trintellix 10 mg daily. Discussed side effects of each continue Adderall to XR 20 mg AM  12/27/2021 appointment with the following noted: Not OK.  I feel less depressed but feels bat shit. Not sleeping well.  Extremely anxious. Off and on sleep. 3-4 hours of sleep.   Still having daily SI.  But also become obvious has so much to do.  Overwhelmed by tasks.   Needs anxiety meds to just function. Not more motivated.  Walked yesterday.   Feels afraid like in trouble but not irritable or angry. DC DT agitation Vraylar to 1.5 mg daily Change Prozac to Trintellix 10 mg daily. Hold Adderall to XR 20 mg AM Clonidine 0.1 1/2 tablet twice daily for 2 days and if needed for anxiety and sleep increase to 1 twice daily Ok temporary Ativan 1 mg 3 times daily as needed anxiety  01/05/22 appt noted: Off fluoxetine and  Trintellix.  Only on Ativan, trazodone and Adderall XR 20 plus added clonidine 0.1 mg BID Didn't think she needed to start Trintellix. Not taking Ativan.   Didn't like herself last week. Feels some better today. Wonders if the manic sx Not agitated.  Anxiety kind of calmed down.  A lot to be anxious about situationally.  $ stress. Concerns about downers with meds. Can't access normal personality. ? Lethargy and inability to talk as sE. Plan: Latuda 20-40 mg daily with food. Adderall to XR 20 mg AM Clonidine 0.1 1/2 tablet twice daily  reduce dose to be sure no SE Ok temporary Ativan 1 mg 3 times daily as needed anxiety  01/19/22 appt noted: Taking Latuda 20 mg daily.  Took 40 mg once and felt anxious and  SI Still depressed and not very reactive Anxiety mainly about the depression and fears of the future. She wants to revisit manic sx and thinks it was  maybe bc taking delta 8 bc was taking a lot  of it so still doesn't think she's classic bipolar. She wants to only take Prozac bc thinks Latuda is perpetuating depression. Says the delta 8 was very psychaedelic.  When not taking it was not manic.  Sleeping ok again.  Plan: Per her request DC Latuda 20-40 mg daily with food. She wants to continue Prozac alone AMA  Adderall to XR 20 mg AM Clonidine 0.1 1/2 tablet twice daily  reduce dose to be sure no SE Ok temporary Ativan 1 mg 3 times daily as needed anxiety  01/23/2022 phone call complaining of increased anxiety since stopping Latuda.  She will try increasing clonidine.  01/26/2022 phone call not feeling well and wanted to restart the Vraylar.  However notes indicate that had made her agitated therefore she was encouraged to pick up samples of Rexulti 1 mg and start that instead.  02/06/2022 phone call: Stating she felt the Rexulti was helping with depression but she was not sleeping well and obsessing over things.  She was encouraged to increase Rexulti to 2 mg daily and increase trazodone for sleep.  02/09/2022 appointment with the following noted: This was an urgent work in appointment No sleep last night with trazodone 100 mg HS Nothing really better depression or anxiety. Ruminating negative anxious thoughts. Did not tolerate Rexulti because it was causing insomnia.  Does not think it helped depression.  Lacks emotion that she should have.  Lacks her usual personality.  Some hopeless thoughts.  Some death thoughts.  Some suicidal thoughts without plan or intent Plan: DC Rexulti and Prozac & DC trazodone Adderall to XR 20 mg AM Clonidine 0.1 1/tablet twice daily  reduce dose to be sure no SE Ok temporary Ativan 1 mg 3 times daily as needed anxiety Start Seroquel XR 150 mg nightly  03/02/2022 appointment: Langley Gauss called back a few days after starting Seroquel stating it was making her more anxious and more depressed.  This seemed unlikely as  this medicine rarely ever causes anxiety.  She stopped the medication waited 3 days and called back still had anxiety and depression but thought perhaps the anxiety was a little better.  She did not want to take the Seroquel. She knew about the option of Spravato and wanted to pursue that. Now questions whether to return to Seroquel while waiting to start Spravato bc feels just as bad without it and knows she didn't give it enough time to work.   MADRS 46  ECT-MADRS    Flowsheet Row Clinical Support from 01/08/2023 in [facility] from 08/06/2022 in [facility] from 07/04/2022 in [facility] from 05/21/2022 in [facility] Office Visit from 03/02/2022 in [facility]  MADRS Total Score '21 29 15 27 '$ 46      03/14/22 appt noted: Pt received Spravato 56 mg first dose today with some dissociative sx which were not severe.  She was anxious prior to the administration and felt better after receiving lorazepam 1 mg.  No NV, or HA. Wants to continue Spravato. Ongoing depression and desperate to feel better.  I'm not myself DT deprsssion which is most severe in recent history.  Anhedonia.  Low motivation.  Social avoidance. Continues to think all recent med trials are making her worse.  Sleep ok with Seroquel.  03/16/22 appt noted: Received Spravato 84 mg for the first time.  some dissociative sx which were not severe.  She  was anxious prior to the administration and felt better after receiving lorazepam 1 mg.  No NV, or HA. Wants to continue Spravato.   Does not feel any better or different since the last appt.  Ongoing depression.  Ongoing depression and desperate to feel better.  I'm not myself DT deprsssion which is most severe in recent history.  Anhedonia.  Low motivation.  Social avoidance. Continues to think all  recent med trials are making her worse.  Sleep ok with Seroquel.  Does not want to continue Seroquel for TRD.  03/20/2022 appointment noted: Came for Spravato administration today.  However blood pressure was significantly elevated approximately 180/115.  She was given lorazepam 1 mg and clonidine 0.2 mg to try to get it down. She states she regretted stopping the Seroquel XR 300 mg tablets.  She now realizes it was helpful.  She did not sleep much at all last night.  She did not take the Adderall this morning. 2 to 3 hours after arrival blood pressure was still elevated at  170/110, 62 pulse.  For Spravato administration was canceled for today.  She admits to being anxious and depressed.  She is not suicidal.  She is highly motivated to receive the Spravato.  We discussed getting it tomorrow.  03/22/2022 appointment noted: Patient's blood pressure was never stable enough yesterday in order to get her in for Spravato administration.  She was encouraged to see her primary care doctor.  It is better today.  03/26/2022 appointment with the following noted: Blood pressure was better.  Saw her primary care doctor who started on oral Bystolic 2.5 mg daily. Received Spravato 84 mg today as scheduled.  Tolerated it well without nausea or vomiting headache or chest pain or palpitations.  Her blood pressure was borderline but manageable. She remains depressed and anxious.  She is ambivalent about the medicine and desperate to get to feel better.  Continues to have anhedonia and low energy and low motivation and reduced ability to do things.  Less social.  Not suicidal.  03/28/22 appt noted: Received Spravato 84 mg today as scheduled.  Tolerated it well without nausea or vomiting headache or chest pain or palpitations.  Her blood pressure was borderline but manageable. Has not seen any improvement so far.  Tolerating Seroquel.  Inconsistent with Bystolic and BP has been borderline high. Still depressed and anxious  and anhedonia.  Low motivation, energy, productivity. Taking quetiapine and tolerating XR 300 mg nightly.  04/04/22 appt noted: Received Spravato 84 mg today as scheduled.  Tolerated it well without nausea or vomiting headache or chest pain or palpitations.  Her blood pressure was borderline but manageable. Has not seen any improvement so far.  Tolerating Seroquel.   She still tends to think that the medications are making her worse.  She has said this about each of the recent psychiatric medicines including Seroquel.  However her husband thinks she is improved.  She also admits there is some improvement in productivity.  She still feels highly anxious.  She still does not enjoy things as normal.  She still feels desperate to improve as soon as possible. Has been taking Seroquel XR since 03/20/2022  04/10/22 appt noted: Received Spravato 84 mg today as scheduled.  Tolerated it well without nausea or vomiting headache or chest pain or palpitations.  Her blood pressure was borderline but manageable. Has not seen any improvement so far.  Tolerating Seroquel.  Doesn't like Seroquel bc she thinks it flattens here. 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  Tolerated it well without nausea or vomiting headache or chest pain or palpitations.  Her blood pressure was borderline but manageable. Has not seen any improvement so far.  Tolerating Seroquel.  Doesn't like Seroquel bc she thinks it flattens her. Received Spravato 84 mg today as scheduled.  Tolerated it well without nausea or vomiting headache or chest pain or palpitations.  Her blood pressure was borderline but manageable. Has not seen any improvement so far.  Tolerating Seroquel.  Doesn't like Seroquel bc she thinks it flattens here.  We discussed her ambivalence about it. She is  starting Auvelity and has tolerated it the last 2 days without side effect.  She still does not feel like herself and feels flat and not enjoying things with suppressed expressed emotion  04/17/2022 appointment with the following noted: Received Spravato 84 mg today as scheduled.  Tolerated it well without nausea or vomiting headache or chest pain or palpitations.  Her blood pressure was borderline but manageable. Has not seen any improvement so far.  Tolerating Seroquel.  Doesn't like Seroquel bc she thinks it flattens her. She has been tolerating the Auvelity 1 in the morning without side effects for about a week.  She has not noticed significant improvement so far.  She still feels depressed and flat and not herself.  Other people notice that she is flat emotionally.  She is not suicidal.  She does feel discouraged that she is not getting better yet.  04/19/2022 appointment noted: Has increased Auvelity to 1 twice daily for 2 days, continues quetiapine XR 300 mg nightly, clonidine 0.3 mg twice daily, lorazepam 1 mg twice daily for anxiety and Adderall XR 20 mg in the morning. No obious SE but she still thinks quetiapine XR is making her feel down.  But not sedated Received Spravato 84 mg today as scheduled.  Tolerated it well without nausea or vomiting headache or chest pain or palpitations.  Her blood pressure was borderline but manageable. She still feels quite anxious and feels it necessary to take both the clonidine and lorazepam twice a day to manage her anxiety.  She has been consistently down and flat and not herself until yesterday afternoon she noted an improvement in mood and feeling much more like herself with her normal personality reemerging.  She was quite depressed in the morning with very dark negative thoughts.  She did not have those dark negative thoughts this morning.  She had a lot of questions about medication and when she was expecting to be improved and why she has not shown  improvement up to now.  04/23/22 appt noted: Has increased Auvelity to 1 twice daily for 1 week, continues quetiapine XR 300 mg nightly, clonidine 0.3 mg twice daily, lorazepam 1 mg twice daily for anxiety and Adderall XR 20 mg in the morning. No obious SE but she still thinks quetiapine XR is making her feel down.  But not sedated Received Spravato 84 mg today as scheduled.  Tolerated it well without nausea or vomiting headache or chest pain or palpitations.  She is still depressed but admits better function and is able to enjoy social interactions. Tolerating meds.  Would like to feel better for sure. Not herself.  Flat. Plan increase Auvelity to 1 tab BID as planned and reduce Quetiapine to 1/2 of ER 300 mg  bc  NR for depression.  04/25/2022 appointment with the following noted: clonidine 0.3 mg twice daily, lorazepam 1 mg twice daily for anxiety and Adderall XR 20 mg in the morning. Seroquel XR 300 HS No obious SE but she still thinks quetiapine XR is making her feel down.  But not sedated Received Spravato 84 mg today as scheduled.  Tolerated it well without nausea or vomiting headache or chest pain or palpitations.  Called yesterday with more anxiety.  Had increased Auvelity for 1 day and reduced Seroquel XR for 1 day.  Felt restless and fearful  05/01/2022 appointment noted: clonidine 0.3 mg twice daily, lorazepam 1 mg twice daily for anxiety and Adderall XR 20 mg in the morning. Seroquel XR 150 HS, Auvelity 1 BID Received Spravato 84 mg today as scheduled.  Tolerated it well without nausea or vomiting headache or chest pain or palpitations.  Nurse has noted patient has called multiple times sometimes asking the same question repeatedly.  It is unclear whether she is truly forgetful or is just anxious seeking reassurance. Patient acknowledges ongoing depression as well as some anxiety but states she has felt a little better in the last couple of days.  She has reduced the Seroquel to 150 mg  at night and has increased Auvelity to 1 twice daily but only for 1 day.  So far she seems to be tolerating it.  05/03/22 appt noted: clonidine 0.2 mg twice daily, lorazepam 1 mg twice daily for anxiety and Adderall XR 20 mg in the morning. Seroquel XR 150 HS, Auvelity 1 BID BP high this am about 170/100 and received extra clonidine 0.2 mg and came to receive Spravato.  Not dizzy, no SOB, nor CP but BP is still high Could not receive Spravato today bc BP high and pulse low at 30 ppm. Still depressed and anxious. Plan: continue trial Auvelity with Spravato She needs to get BP and pulse managed  05/08/22 TC: RTC  H Michael NA and mailbox full.  Could not leave message.  Pt  -  talked to she and H on speaker. H worried over wife.  Vacant stare.  Slurs words at times.  Not smiling. Reduced enjoyment.  Depression.  Withdrawn from usual activities.  Some irritability.  Anxious. Disc her concerns meds are making her worse.  Extensive discussion about her treatment resistant status.  There is a consistent pattern of not taking the medicines long enough to get benefit because she believes the meds are making her worse.  However the symptoms she describes as side effects are exactly the same symptoms that she had prior to taking the medication RX for  the depression.  So it is not clear that these are actual side effects. This is true about the 2 most recent meds including Seroquel and Auvelity.  Recommend psychiatric consultation in hopes of improving her comfort level with taking prescribed medications for a sufficient length of time to provide benefit. Extensive discussion about ECT is the treatment of choice for treatment resistant depression.  Spravato may work if she can comply with consistency.  There are medication options but they take longer to work.   Plan:  Reduce clonidine to 0.1 mg BID DT bradycardia.  Talk with PCP about BP and low pulse problems which are interfering with her consistent  compliance with Spravato.   Limit lorazepam to 3 -4  mg daily max. Excess use is the cause of slurring speech.  She must stop excess use or will have to stop the  med. Stop Auvelity per her request.  But she has only been on the full dose for a little over a week and clearly has not had time to get benefit from it.  She thinks maybe it is making her more anxious. Reduce Seroquel from 150XR to 50 -100 mg at night IR.  She couldn't sleep when stopped it completely. Will not start new antidepressant until her SE issues are resolved or not. Get second psych opinion from Yehuda Budd MD or another psychiatrist.  H's sister is therapist in [place] Hoyer, MD, [facility]  05/16/2022 appointment with the following noted: Received Spravato 84 mg today as scheduled.  Tolerated it well without nausea or vomiting headache or chest pain or palpitations.  She stopped Auvelity as discussed last week. On her own, without physician input, she restarted Wellbutrin XL 450 mg every morning today.  She had taken it in the past.  She feels jittery and anxious. She feels less depressed than she did last week.  But she is still depressed without her usual range of affect.  She still is less social and less motivated than normal. Her primary care doctor increased the dose of losartan Plan: Stop Seroquel Reduce Wellbutrin XL to 300 mg every morning.  Starting the dose at 450 every morning is likely causing side effects of jitteriness and it should not be started at that have a dosage. Recommend she not change meds on her own without MDM put  05/23/2022 appointment with the following noted: Received Spravato 84 mg today as scheduled.  Tolerated it well without nausea or vomiting headache or chest pain or palpitations.  Has not dropped seroquel XR 300 mg 1/2 tablet nightly bc couldn't sleep without it. Has not tried lower dose quetiapine 50 mg HS Still feels depressed.   BP is better managed so far, just saw PCP.  BP is  better today and infact is low today. Dropped clonidine as directed from 0.3 mg BID bc inadequate control of BP to 0.2 mg BID.  However she wants to increase it back to 0.3 mg twice daily because she feels it helped her anxiety better.  Wonders about increasing Wellbutrin for depression.  However she has only been on 300 mg a day for a week.  She was on 450 mg daily in the past.  06/06/22 appt noted: Received Spravato 84 mg today as scheduled.  Tolerated it well without nausea or vomiting headache or chest pain or palpitations.  She is still depressed and anxious.  She wants to try to stop the Seroquel but cannot sleep without some of it.  She is taking lorazepam 1 mg 4 times daily and still having a lot of anxiety.  She wants to increase clonidine back to 0.3 mg twice daily.  She hopes for more improvement She recently went for a second psychiatric opinion as suggested the results of that are pending.  06/11/22 appt noted: Received Spravato 84 mg today as scheduled.  Tolerated it well without nausea or vomiting headache or chest pain or palpitations.  She is still depressed and anxious. Without much change.  Still hopeless, anhedonia, reduced inteterest and motivation.  Tolerating meds. Disc concerns Spravato is not hleping much. Plan: stop Seroquel and start olanzapine 10 mg HS for TRD and anxiety.  06/13/2022 appointment noted: Received Spravato 84 mg today as scheduled.  Tolerated it well without nausea or vomiting headache or chest pain or palpitations.  She is still depressed and anxious. Without much change.  Still  hopeless, anhedonia, reduced inteterest and motivation.  Tolerating meds. Disc concerns Spravato is not helping much as hoped but is improving a bit in the last week. Tolerating meds. Continues Wellbutrin XL 450 AM, tolerating recently started olanzapine  10 mg HS. Sleep is good.   Pending appt with [place] consult.  06/18/22 appt noted: Received Spravato 84 mg today as scheduled.   Tolerated it well without nausea or vomiting headache or chest pain or palpitations.  Tolerating meds. Continues Wellbutrin XL 450 AM, tolerating recently started olanzapine  10 mg HS. Continues Adderall XR 20 amd and has tried to reduce lorazepam to '1mg'$  TID Sleep is good.   Pending appt with [place] consult. Depression is a little bit better in the last week with a little improvement in emotional expression and interest.  She is pushing herself to be more active.  Her daughter thought she was a little better than she has been.  However she is still depressed and still not her normal self with anhedonia and reduced emotional expressiveness.  06/20/22 appt noted: Received Spravato 84 mg today as scheduled.  Tolerated it well without nausea or vomiting headache or chest pain or palpitations.  Tolerating meds with a little sleepiness. Continues Wellbutrin XL 450 AM, tolerating recently started olanzapine  10 mg HS. Continues Adderall XR 20 amd and has tried to reduce lorazepam to '1mg'$  TID Sleep is good.   Mood is improving.  Better funciton.  Anxiety is better with olanzapine. Still not herself and depression not gone with some anhedonia and social avoidance and feeling overwhelmed.  8/14 2023 received Spravato 84 mg 06/27/2022 received Spravato Spravato 84 mg 07/02/2022 received Spravato 84 mg 07/04/2022 received Spravato 84 mg  07/09/2022 appointment noted: Received Spravato 84 mg today as scheduled.  Tolerated it well without nausea or vomiting headache or chest pain or palpitations.  Expected dissociation and feels less depressed with resolution of negative emotions immediately after Spravato and then depression, anxiety creep back in. Continues meds Adderall XR 20 mg every morning, Wellbutrin XL 450 every morning, clonidine 0.1 mg twice daily, lorazepam 1 mg every 6 hours as needed, olanzapine increased from 7.5 to 10 mg nightly on  Tolerating meds.  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  She does note some increased appetite and is somewhat concerned about that but has not gained significant amounts of weight. She has had the [place] consultation which was initially denied but she knows it can be appealed.  However because she is improving with Spravato plus the other medications now she wants to continue the current treatment plan.  07/18/22 appt noted: Continues meds Adderall XR 20 mg every morning, Wellbutrin XL 450 every morning, clonidine 0.1 mg twice daily, lorazepam 1 mg every 6 hours as needed, olanzapine increased from 7.5 to 10 mg nightly on 07/04/2022. Received Spravato 84 mg today as scheduled.  Tolerated it well without nausea or vomiting headache or chest pain or palpitations.  Expected dissociation and feels less depressed with resolution of negative emotions immediately after Spravato and then depression, anxiety creep back in. Continues meds Adderall XR 20 mg every morning, Wellbutrin XL 450 every morning, clonidine 0.1 mg twice daily, lorazepam 1 mg every 6 hours as needed, olanzapine increased from 7.5 to 10 mg nightly on  Tolerating meds.  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  She does note some increased appetite and is somewhat concerned about that but has not gained significant amounts of weight. She has had the [place] consultation which was initially denied but she knows it can be appealed. She continues to have chronic ambivalence about psychiatric medicines and initially tends to blame her depressive symptoms such as decreased concentration and feeling flat on what ever medicine she currently is taking even though she had the same symptoms before the current medicines were started.  Then after discussion she does admit that her  depressive symptoms are improved since adding olanzapine but still has those residual symptoms noted.  07/23/22 received Spravato 84 mg   07/30/2022 appointment noted: Received Spravato 84 mg today as scheduled.  Tolerated it well without nausea or vomiting headache or chest pain or palpitations.  Expected dissociation and feels less depressed with resolution of negative emotions immediately after Spravato and then depression, anxiety creep back in. Continues meds Adderall XR 20 mg every morning, Wellbutrin XL 450 every morning, clonidine 0.1 mg twice daily, lorazepam 1 mg every 6 hours as needed, olanzapine increased from 7.5 to 10 mg nightly on  She has been inconsistent with olanzapine because she continues to be ambivalent about the medications in general and thinks that perhaps the 10 mg is making her feel blunted.  She continues to feel some depression.  She had a good day this week and but still feels somewhat depressed and persistently anxious. Plan: be consistent with olanzapine 10 mg HS for TRD and longer trial for potential benefit for anxiety.  Has not taken it consistently.  08/06/2022 appointment noted: Received Spravato 84 mg today as scheduled.  Tolerated it well without nausea or vomiting headache or chest pain or palpitations.  Expected dissociation and feels less depressed with resolution of negative emotions immediately after Spravato and then depression, anxiety creep back in. Continues meds Adderall XR 20 mg every morning, Wellbutrin XL 450 every morning, clonidine 0.1 mg twice daily, lorazepam 1 mg every 6 hours as needed, olanzapine i 10 mg nightly  She continues to feel depressed but is about 50% better with Spravato.  She is still not herself.  She still has anhedonia.  She still is not her able to engage socially in the typical ways.  She is not jovial and outgoing like normal.  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  She continues to feel that her personality is dampened down.  There is a question about whether it is related to depression or medication. Plan: continue olanzapine 10 for longer trial for TRD and severe anxiety.  08/13/22 appt noted:  Received Spravato 84 mg today as scheduled.  Tolerated it well without nausea or vomiting headache or chest pain or palpitations.  Expected dissociation and feels less depressed with resolution of negative emotions immediately after Spravato and then depression, anxiety creep back in. Continues meds Adderall XR 20 mg every morning, Wellbutrin XL 450 every morning, clonidine 0.1 mg twice daily, lorazepam 1 mg every 6 hours as needed, olanzapine i 10 mg nightly  She still does not feel herself.  Still struggles with depression and low motivation and reduced social engagement and reduced interest and reduced emotional expression.  She is somewhat better with the medicines plus Spravato.  She still believes the [employer]  makes her blunted and is not sure how much it helps her anxiety.  She can have good days when her family is around and she is engaged.  She still wants to stop the olanzapine. She has apparently continued to take the trazodone despite having been told to stop it when she started olanzapine.  She feels like she needs the trazodone. Plan: DC olanzapine and Start nortriptyline 25 mg nightly and build up to 75 mg nightly and then check blood level.    08/27/2022 appointment noted: Received Spravato 84 mg today as scheduled.  Tolerated it well without nausea or vomiting headache or chest pain or palpitations.  Expected dissociation and feels less depressed with resolution of negative emotions immediately after Spravato and then depression, anxiety creep back in. Continues meds Adderall XR 20 mg every morning, Wellbutrin XL 450 every morning, clonidine 0.1 mg twice daily, lorazepam 1 mg every 6 hours as  needed. Stopped olanzapine and started nortriptyline which she has taken for about a week is 75 mg nightly. So far she is tolerating the nortriptyline well with the exception of some dry mouth and constipation which she is working to manage.  She does not feel substantially better better or different off the olanzapine.  No change in her sleep which is good.  Main concern currently in addition to the residual depression is anxiety which is somewhat situational with pending arch show.  She is worrying about it more than normal.  Says she is having to take lorazepam twice a day where she had been able to keep reduce it prior to this.  She still does not feel like herself with residual depression with less social interest and less of her usual buoyancy in personality.  She is flatter than normal.  Overall she still feels that the Spravato has been helpful at reducing the severity of the depression.  She is not suicidal. She has not heard anything about the [place] appeal as of yet.  09/05/2022 appointment noted: Received Spravato 84 mg today as scheduled.  Tolerated it well without nausea or vomiting headache or chest pain or palpitations.  Expected dissociation and feels less depressed with resolution of negative emotions immediately after Spravato and then depression, anxiety creep back in. Continues meds Adderall XR 20 mg every morning, Wellbutrin XL 450 every morning, clonidine 0.1 mg twice daily, lorazepam 1 mg every 6 hours as needed. Stopped olanzapine and started nortriptyline which she has taken for about 2 week is 75 mg nightly. Initially blood pressure was a little high causing delay in starting Spravato.  She admitted to feeling a little wound up.  She still experiences a little increase in depression if she goes longer than a week in between doses of Spravato.  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  09/10/22 appt noted: Received Spravato 84 mg today as scheduled.  Tolerated it well without nausea or vomiting headache or chest pain or palpitations.  Expected dissociation gradually resolved over the 2 hour observation period. She feels 50% less depressed with Spravto and wants to continue it.   Continues meds Adderall XR 20 mg every morning, Wellbutrin XL 450 every morning, clonidine 0.1 mg twice daily, lorazepam 1 mg every 6 hours as needed. Has started nortriptyline 75 mg nightly for about 3 weeks. Has not seen a significant difference with the addition of nortriptyline.  Tolerating it  pretty well. She continues to have some degree of anhedonia and significant depression and anxiety.  Her daughters noticed that she is more needy and calls more frequently.  She acknowledges this as well.  She is clearly still not herself. Plan: pramipexole off label and RX 0.25 mg BID  09/17/2022 appointment noted: Received Spravato 84 mg today as scheduled.  Tolerated it well without nausea or vomiting headache or chest pain or palpitations.  Expected dissociation gradually resolved over the 2 hour observation period. She feels 50% less depressed with Spravto and wants to continue it.   Continues meds Adderall XR 20 mg every morning, Wellbutrin XL 450 every morning, clonidine 0.1 mg twice daily, lorazepam 1 mg every 6 hours as needed. Has started nortriptyline 75 mg nightly for about 3 weeks and DT level 176, reduced to 50 mg HS early November. Still the same sx as noted last visit.  Tolerating meds.   Compliant.  Still depressed and family notices.  Has been able to participate in family interactions.  Some post-show let down and has to do detailed work which is hard for her bc ADD.  Sleep and eating well.  Energy OK but not great.  No SI.  Not cried in a year or so.  Clearly less depressed and hopeless than before the Spravato.  09/24/22 appt noted: Received Spravato 84 mg today as scheduled.   Tolerated it well without nausea or vomiting headache or chest pain or palpitations.  Expected dissociation gradually resolved over the 2 hour observation period. She feels 50% less depressed with Spravto and wants to continue it.   Continues meds Adderall XR 20 mg every morning, Wellbutrin XL 450 every morning, clonidine 0.1 mg twice daily, lorazepam 1 mg every 6 hours as needed. Has started nortriptyline 75 mg nightly for about 3 weeks and DT level 176, reduced to 50 mg HS early November. Still the same sx as noted last visit.  Tolerating meds.   Compliant.  Still depressed and family notices.  Has been able to participate in family interactions.  Some post-show let down and has to do detailed work which is hard for her bc ADD.  Sleep and eating well.  Energy OK but not great.  No SI.  Not cried in a year or so.  Clearly less depressed and hopeless than before the Spravato. Is not making further progress generally.  Stuck with moderate depression  10/02/22 appt noted: Received Spravato 84 mg today as scheduled.  Tolerated it well without nausea or vomiting headache or chest pain or palpitations.  Expected dissociation gradually resolved over the 2 hour observation period. She feels 50% less depressed with Spravto and wants to continue it.   Continues meds Adderall XR 20 mg every morning, Wellbutrin XL 450 every morning, clonidine 0.1 mg twice daily, lorazepam 1 mg every 6 hours as needed. Has started nortriptyline 75 mg nightly for about 3 weeks and DT level 176, reduced to 50 mg HS early November. Still the same sx as noted last visit.  Tolerating meds.   Compliant.  Still depressed and family notices.  Has been able to participate in family interactions.  Some post-show let down and has to do detailed work which is hard for her bc ADD.  Sleep and eating well.  Energy OK but not great.  No SI.  Not cried in a year or so.  Clearly less depressed and hopeless than before the Spravato. Is not making  further progress generally.  Stuck with  moderate depression.  Is able to function pretty normally. Plan: trial pramipexole 0.25 mg BID off label for depression.   10/08/22 appt noted: Received Spravato 84 mg today as scheduled.  Tolerated it well without nausea or vomiting headache or chest pain or palpitations.  Expected dissociation gradually resolved over the 2 hour observation period. She feels 50% less depressed with Spravto and wants to continue it.   Continues meds Adderall XR 20 mg every morning, Wellbutrin XL 450 every morning, clonidine 0.1 mg twice daily, lorazepam 1 mg every 6 hours as needed. Has started nortriptyline 75 mg nightly for about 3 weeks and DT level 176, reduced to 50 mg HS early November. Still the same sx as noted last visit.  Tolerating meds.   Compliant.  Still depressed and family notices.  Has been able to participate in family interactions.  Some post-show let down and has to do detailed work which is hard for her bc ADD.  Sleep and eating well.  Energy OK but not great.  No SI.  Not cried in a year or so.  Clearly less depressed and hopeless than before the Spravato. Is not making further progress generally.  Stuck with moderate depression.  Behaved and felt pretty normally with family over for Thanksgiving. Doesn't see benefit or SE with pramipexole but thinks maybe it makes her worse. Plan:  increase pramipexole augmentation off label to 0.5 mg BID  10/15/2022 appointment noted: Received Spravato 84 mg today as scheduled.  Tolerated it well without nausea or vomiting headache or chest pain or palpitations.  Expected dissociation gradually resolved over the 2 hour observation period. She feels 50% less depressed with Spravto and wants to continue it.   Continues meds Adderall XR 20 mg every morning, Wellbutrin XL 450 every morning, clonidine 0.2 mg twice daily, lorazepam 1 mg every 6 hours as needed.  Trazodone 50 mg tablets 1-2 nightly as needed insomnia Has  started nortriptyline 75 mg nightly for about 3 weeks and DT level 176, reduced to 50 mg HS early November. Recommended increase pramipexole 0.5 mg twice daily off label for treatment resistant depression on 10/08/2022. She feels better motivated more active with pramipexole 0.5 mg twice daily.  She still is depressed but it is better.  We discussed the possibility of going up in the dose but did not change it.  10/22/2022 appointment noted: Received Spravato 84 mg today as scheduled.  Tolerated it well without nausea or vomiting headache or chest pain or palpitations.  Expected dissociation gradually resolved over the 2 hour observation period. She feels 50% less depressed with Spravto and wants to continue it.   Continues meds Adderall XR 20 mg every morning, Wellbutrin XL 450 every morning, clonidine 0.2 mg twice daily, lorazepam 1 mg every 8 hours as needed.  Trazodone 50 mg tablets 1-2 nightly as needed insomnia Has started nortriptyline 75 mg nightly for about 3 weeks and DT level 176, reduced to 50 mg HS early November. pramipexole 0.5 mg twice daily off label for treatment resistant depression on 10/08/2022. She is better motivated than she was.  She is journaling 3 pages a day.  She has started walking and has walked 5 days a week for 50 minutes for the last 2 weeks.  That is significantly helped her mood.  Her mood tends to be better when she interacts with family.  However she still has some periods of depression.  She is tolerating the medications well.  She still feels like her affect  and confidence is not back to normal. Plan:  increase pramipexole augmentation off label to 0.5 mg tID or 0.75 mg BID   10/29/22 appt noted: Received Spravato 84 mg today as scheduled.  Tolerated it well without nausea or vomiting headache or chest pain or palpitations.  Expected dissociation gradually resolved over the 2 hour observation period. She feels 50% less depressed with Spravto and wants to  continue it.   Continues meds Adderall XR 20 mg every morning, Wellbutrin XL 450 every morning, clonidine 0.2 mg twice daily, lorazepam 1 mg every 8 hours as needed.  Trazodone 50 mg tablets 1-2 nightly as needed insomnia Has started nortriptyline 75 mg nightly for about 3 weeks and DT level 176, reduced to 50 mg HS early November. pramipexole increased to 0.75 mg twice daily off label for treatment resistant depression  She has been feeling somewhat better with the increase in pramipexole and is tolerating it well.  She still is easily overwhelmed.  Her affect and mood can improve now when around her family or doing something positive.  She has been able to be more productive. She is tolerating the current medications. She is still considering TMS as an alternative to Spravato.  11/15/2022 appointment noted: Received Spravato 84 mg today as scheduled.  Tolerated it well without nausea or vomiting headache or chest pain or palpitations.  Expected dissociation gradually resolved over the 2 hour observation period. She feels 50% less depressed with Spravto and wants to continue it.   Continues meds Adderall XR 20 mg every morning, Wellbutrin XL 450 every morning, clonidine 0.2 mg twice daily, lorazepam 1 mg every 8 hours as needed.  Trazodone 50 mg tablets 1-2 nightly as needed insomnia Has started nortriptyline 75 mg nightly for about 3 weeks and DT level 176, reduced to 50 mg HS early November. pramipexole increased to 0.75 mg twice daily off label for treatment resistant depression  He has noticed some increase in depression due to the length of time since the last Spravato administration.  She believes Spravato is helping her.  sHe is not suicidal but has felt very blue the last few days. She is tolerating the medication. She is ambivalent about Spravato versus [place] but she is considering [place]. She wants to continue Spravato administration because it is clearly helpful.  11/19/22 appt noted: Received  Spravato 84 mg today as scheduled.  Tolerated it well without nausea or vomiting headache or chest pain or palpitations.  Expected dissociation gradually resolved over the 2 hour observation period. She feels 50% less depressed with Spravto and wants to continue it.   Continues meds Adderall XR 20 mg every morning, Wellbutrin XL 450 every morning, clonidine 0.2 mg twice daily, lorazepam 1 mg every 8 hours as needed.  Trazodone 50 mg tablets 1-2 nightly as needed insomnia Has started nortriptyline 75 mg nightly for about 3 weeks and DT level 176, reduced to 50 mg HS early November. pramipexole increased to 0.75 mg twice daily off label for treatment resistant depression  She has felt better back on Spravato more regularly.  However she still has residual depression esp when alone or when wihtout activity.  Can function when needed.   Does not have her connfidence back. She plans to pursue [place] availability. Have discussed retrying [place]  11/28/22 appt noted: Received Spravato 84 mg today as scheduled.  Tolerated it well without nausea or vomiting headache or chest pain or palpitations.  Expected dissociation gradually resolved over the 2 hour observation period.  She feels 50% less depressed with Spravto and wants to continue it.   Continues meds Adderall XR 20 mg every morning, Wellbutrin XL 450 every morning, clonidine 0.2 mg twice daily, lorazepam 1 mg every 8 hours as needed.  Trazodone 50 mg tablets 1-2 nightly as needed insomnia Has started nortriptyline 75 mg nightly for about 3 weeks and DT level 176, reduced to 50 mg HS early November. pramipexole increased to 0.75 mg twice daily off label for treatment resistant depression  Last week she was more depressed than usual for reasons that are not clear.  She has not had a clear answer from Adrienne Mocha about [facility] options.  States that they have not returned her call.  She is asked about Auvelity retry in place of Wellbutrin.  Has still been  walking.  12/03/22 appt noted: Received Spravato 84 mg today as scheduled.  Tolerated it well without nausea or vomiting headache or chest pain or palpitations.  Expected dissociation gradually resolved over the 2 hour observation period. She feels 50% less depressed with Spravto and wants to continue it.   Continues meds Adderall XR 20 mg every morning, Wellbutrin XL 450 every morning, clonidine 0.2 mg twice daily, lorazepam 1 mg every 8 hours as needed.  Trazodone 50 mg tablets 1-2 nightly as needed insomnia started nortriptyline 75 mg nightly for about 3 weeks and DT level 176, reduced to 50 mg HS early November. pramipexole increased to 0.75 mg twice daily off label for treatment resistant depression  No SE .  Satisfied with meds. Depression is better in the last week.  Not sure why that is the case.  Still walking daily and that helps and journaling 3 pages daily.  Working on [employer], academic.  No changes desire.  Clearly benefits from Spravato but not 100%.  Still considering TMS.  12/10/22 appt noted: Received Spravato 84 mg today as scheduled.  Tolerated it well without nausea or vomiting headache or chest pain or palpitations.  Expected dissociation gradually resolved over the 2 hour observation period. She feels 50% less depressed with Spravto and wants to continue it.   Continues meds Adderall XR 20 mg every morning, Wellbutrin XL 450 every morning, clonidine 0.2 mg twice daily, lorazepam 1 mg every 8 hours as needed.  Trazodone 50 mg tablets 1-2 nightly as needed insomnia started nortriptyline 75 mg nightly for about 3 weeks and DT level 176, reduced to 50 mg HS early November. pramipexole increased to 0.75 mg twice daily off label for treatment resistant depression  No SE .   Depression was worse this week for no apparent reason.  She struggled being positive.  She has felt more discouraged.  She has felt more anxious.  She has had a hard time doing tasks.  She is interested in retrying the  [place] which we had discussed previously. Plan: She agrees to [facility].  To improve tolerability and reduce risk of side effects, Stop Wellbutrin and start Auvelity 1 in the morning for 1 week then 1 twice daily AndReduce pramipexole 0.5 mg BID and reduce nortriptyline to 50 mg HS  12/17/22 appt noted: Received Spravato 84 mg today as scheduled.  Tolerated it well without nausea or vomiting headache or chest pain or palpitations.  Expected dissociation gradually resolved over the 2 hour observation period. She feels 50% less depressed with Spravto and wants to continue it.   Continues meds Adderall XR 20 mg every morning, stopped Wellbutrin XL 150 every morning, clonidine 0.2 mg twice daily,  lorazepam 1 mg every 8 hours as needed.  Trazodone 50 mg tablets 1-2 nightly as needed insomnia Has started nortriptyline 75 mg nightly for about 3 weeks and DT level 176, reduced to 50 mg HS early November. pramipexole 0.375 mg twice daily off label for treatment resistant depression with plan to stop Started Auvelity 1 AM Still depressed to moderate degree.  Tolerating Auvelity so far.  No other problems with meds.  Willing to give Auvelity a chance. Plan: She agrees to [facility].  Continue 1 AM and when tolerated then increase to BID Stop pramipexole.  12/26/22 received Spravato  01/01/23 appt noted: Received Spravato 84 mg today as scheduled.  Tolerated it well without nausea or vomiting headache or chest pain or palpitations.  Expected dissociation gradually resolved over the 2 hour observation period. She feels 50% less depressed with Spravto and wants to continue it.   Continues meds Adderall XR 20 mg every morning, stopped Wellbutrin XL 150 every morning, clonidine 0.2 mg twice daily, lorazepam 1 mg every 8 hours as needed.  Trazodone 50 mg tablets 1-2 nightly as needed insomnia Has started nortriptyline 75 mg nightly for about 3 weeks and DT level 176, reduced to 50 mg HS early  November. Forgot to reduce pramipexole stoll taking  0.5 mg twice daily off label for treatment resistant depression  Started Auvelity 1 AM & PM last week. Occ misses Spravato DT htn.this week a better than last.   Painting again more and it helps mood.  Confidence still low and not as likely to socialize as normal but enjoys family.   Still ambivalent about [place]. Tolerating meds.  Including the increase in [place].    01/08/23 appt noted: Received Spravato 84 mg today as scheduled.  Tolerated it well without nausea or vomiting headache or chest pain or palpitations.  Expected dissociation gradually resolved over the 2 hour observation period. She feels 50% less depressed with Spravto and wants to continue it.   Continues meds Adderall XR 20 mg every morning, stopped Wellbutrin XL 150 every morning, clonidine 0.2 mg twice daily, lorazepam 1 mg every 8 hours as needed.  Trazodone 50 mg tablets 1-2 nightly as needed insomnia Has started nortriptyline 75 mg nightly for about 3 weeks and DT level 176, reduced to 50 mg HS early November. reduced pramipexole to 0.5 mg daily off label for treatment resistant depression  Started Auvelity 1 AM & PM mid Feb. More depressed as week progresses.  Thinks it is worse with less pramipexole.  More negative.  Able to function but feels miserable.  No SI.  Hopeless.  Sleep ok.  Tolerating meds.   Talked to [facility] about [place] and appt mid March.   ECT-MADRS    Flowsheet Row Clinical Support from 01/08/2023 in [place] from 08/06/2022 in [place] from 07/04/2022 in [facility] from 05/21/2022 in [place] Office Visit from 03/02/2022 in [facility]  MADRS Total Score '21 29 15 27 '$ 46        Past Psychiatric Medication Trials: fluoxetine, duloxetine, Viibryd,  Pristiq, sertraline, citalopram,  Trintellix anxious and SI Wellbutrin XL 450 Auvelity 1 dose nortriptyline 75 mg nightly for about 3 weeks and DT level 176, reduced to 50 mg HS NR. Pramipexole 1 mg NR Adderall, Adderall XR, Vyvanse, Ritalin, Strattera low dose NR Lorazepam Trazodone  Depakote,  lamotrigine cog complaints Lithium remotely  Abilify 7.5  Vraylar 1.5 mg daily agitation and insomnia Rexulti insomnia Latuda 40 one dose, CO anxious and SI Seroquel XR 300 Olanzapine 10  At visit November 12, 2019. We discussed Patient developed an increasingly severe alcohol dependence problem since her last visit in January.  She went to [facility] and has had no alcohol since then except 1 day.  She never abused stimulants but they took her off the stimulants at [facility].  Her ADD was markedly worse.  The Wellbutrin did not help the ADD.   D history lamotrigine rash at 67 yo  Review of Systems:  Review of Systems  Constitutional:  Positive for fatigue.  HENT:  Negative for congestion.   Cardiovascular:  Negative for palpitations.  Musculoskeletal:  Positive for back pain. Negative for arthralgias and joint swelling.       SP hip surgery October 2020  Neurological:  Negative for dizziness, tremors and weakness.  Psychiatric/Behavioral:  Positive for decreased concentration and dysphoric mood. Negative for agitation, behavioral problems, confusion, hallucinations, self-injury, sleep disturbance and suicidal ideas. The patient is nervous/anxious. The patient is not hyperactive.        Forgetful at times about med recommendations.    Medications: I have reviewed the patient's current medications.  Current Outpatient Medications  Medication Sig Dispense Refill   amLODipine (NORVASC) 2.5 MG tablet Take 2.5 mg by mouth daily.     amphetamine-dextroamphetamine (ADDERALL XR) 20 MG 24 hr capsule Take 1 capsule (20 mg total) by mouth every morning. 30 capsule 0    amphetamine-dextroamphetamine (ADDERALL XR) 20 MG 24 hr capsule Take 1 capsule (20 mg total) by mouth every morning. 30 capsule 0   buPROPion (WELLBUTRIN XL) 150 MG 24 hr tablet TAKE 3 TABLETS BY MOUTH DAILY (Patient taking differently: Take 150 mg by mouth daily.) 270 tablet 1   cloNIDine (CATAPRES) 0.2 MG tablet Take 1 tablet (0.2 mg total) by mouth 2 (two) times daily. 60 tablet 2   iron polysaccharides (NIFEREX) 150 MG capsule TAKE 1 CAPSULE BY MOUTH EVERY DAY 90 capsule 1   LORazepam (ATIVAN) 1 MG tablet Take 1 tablet (1 mg total) by mouth every 8 (eight) hours as needed. for anxiety 90 tablet 0   losartan (COZAAR) 50 MG tablet Take 50 mg by mouth daily.     nebivolol (BYSTOLIC) 2.5 MG tablet Take 2.5 mg by mouth daily.     nortriptyline (PAMELOR) 25 MG capsule Take 2 capsules (50 mg total) by mouth at bedtime. 180 capsule 0   SPRAVATO, 84 MG DOSE, 28 MG/DEVICE SOPK USE 3 SPRAYS IN EACH NOSTRIL ONCE A WEEK 3 each 5   traZODone (DESYREL) 50 MG tablet TAKE 1-2 TABLETS BY MOUTH NIGHTLY AS NEEDED FOR SLEEP 180 tablet 1   pramipexole (MIRAPEX) 0.5 MG tablet Take 1 tablet (0.5 mg total) by mouth 3 (three) times daily. 90 tablet 0   No current facility-administered medications for this visit.    Medication Side Effects: None  Allergies:  Allergies  Allergen Reactions   Metronidazole Shortness Of Breath and Other (See Comments)    Heart pounding   Ferrlecit [Na Ferric Gluc Cplx In Sucrose] Other (See Comments)    Infusion reaction 05/12/2019    Past Medical History:  Diagnosis Date   ADHD    Anemia    Anxiety    Arthritis    Depression    Heart murmur    i went to see a cardiologit slast eyar  and i had zero  plaque,    PONV (postoperative nausea and vomiting)    Recovering alcoholic in remission [facility])     Family History  Problem Relation Age of Onset   Atrial fibrillation Mother    CAD Father     Past Medical History, Surgical history, Social history, and Family history were  reviewed and updated as appropriate.   Please see review of systems for further details on the patient's review from today.   Objective:   Physical Exam:  There were no vitals taken for this visit.  Physical Exam Constitutional:      General: She is not in acute distress. Neurological:     Mental Status: She is alert and oriented to person, place, and time.     Coordination: Coordination normal.     Gait: Gait normal.  Psychiatric:        Attention and Perception: Attention and perception normal.        Mood and Affect: Mood is anxious and depressed. Affect is not labile, angry or tearful.        Speech: Speech is not rapid and pressured, slurred or tangential.        Behavior: Behavior is not slowed.        Thought Content: Thought content is not paranoid or delusional. Thought content does not include homicidal or suicidal ideation. Thought content does not include suicidal plan.        Cognition and Memory: Cognition normal. Memory is not impaired. She does not exhibit impaired recent memory.        Judgment: Judgment normal.     Comments: Insight intact. No auditory or visual hallucinations. No delusions.  Residual depression and anxiety about 50% better with Spravato but last week a little worse. Affect better with more spontaneity  now vs before Spravato.  less blunted  No [place] intent plan      Lab Review:     Component Value Date/Time   NA 137 01/12/2021 1430   NA 140 11/18/2018 1544   K 3.8 01/12/2021 1430   CL 108 01/12/2021 1430   CO2 22 01/12/2021 1430   GLUCOSE 94 01/12/2021 1430   BUN 14 01/12/2021 1430   BUN 20 11/18/2018 1544   CREATININE 0.82 01/12/2021 1430   CALCIUM 8.9 01/12/2021 1430   PROT 6.6 01/12/2021 1430   ALBUMIN 3.9 01/12/2021 1430   AST 12 (L) 01/12/2021 1430   ALT 11 01/12/2021 1430   ALKPHOS 46 01/12/2021 1430   BILITOT 0.5 01/12/2021 1430   GFRNONAA >60 01/12/2021 1430   GFRAA >60 09/02/2019 0249   GFRAA >60 01/27/2019 0811        Component Value Date/Time   WBC 4.5 01/12/2021 1430   RBC 4.32 01/12/2021 1430   HGB 12.8 01/12/2021 1430   HGB 12.9 07/17/2019 0953   HCT 38.5 01/12/2021 1430   HCT 21.9 (L) 12/25/2018 1221   PLT 272 01/12/2021 1430   PLT 286 07/17/2019 0953   MCV 89.1 01/12/2021 1430   MCH 29.6 01/12/2021 1430   MCHC 33.2 01/12/2021 1430   RDW 12.4 01/12/2021 1430   LYMPHSABS 1.4 01/12/2021 1430   MONOABS 0.4 01/12/2021 1430   EOSABS 0.0 01/12/2021 1430   BASOSABS 0.0 01/12/2021 1430    No results found for: "POCLITH", "LITHIUM"   No results found for: "PHENYTOIN", "PHENOBARB", "VALPROATE", "CBMZ"   .res Assessment: Plan:    Recurrent major depression resistant to treatment ([place]) - Plan: pramipexole (MIRAPEX) 0.5 MG tablet  Generalized anxiety disorder  Attention deficit hyperactivity disorder (ADHD), predominantly inattentive type  Insomnia due to mental condition  Accelerated hypertension   She has treatment resistant major depression ongoing with 50% better with Spravato. Have  discussed some of her  abnormal behaviors last year leading to this depressive episode getting worse which she says were associated with heavy use of delta 8 and not a manic episode.  She realizes now that that was not good for her.  She stopped all use of other drugs including those available over-the-counter such as delta 8 or any other THC related products.  She is no longer having any of those types of behaviors and instead is depressed.  She is experiencing more pleasure and is less blunted and enjoying more and better inteest versus before the Spravato.  She also feels worse if misses a dose of Spravato.  However has been more depressed in the last week than she was the previous week for no apparent reason.  Patient was administered Spravato 84 mg intranasally today.  The patient experienced the typical dissociation which gradually resolved over the 2-hour period of observation.  There were no  complications.  Specifically the patient did not have nausea or vomiting or headache.  Blood pressures monitored at the 40-minute and 2-hour follow-up intervals.  Borderline high.  By the time the 2-hour observation period was met the patient was alert and oriented and able to exit without assistance.  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  See nursing note for further details.She wants to continue Spravato. We discussed discussed the side effects in detail as well as the protocol required to receive Spravato.   Failed multiple antidepressants.  Many of them were not actual failures but intolerances and it is unclear whether some of that was more connected with anxiety than true side effects.  1 example is the [place].  In general she does not want to try anything but an antidepressant but has failed all major categories of antidepressants except TCAs and MAO inhibitors which have not been tried until now. There is a consistent pattern of thinking she is worse with medication which is not consistent with objective evidence.    Her self assessment is clouded by her depression.  Started nortriptyline 75 mg nightly.  Serum level 176.  So Reduced to 50 mg HS  Discussed side effects in detail.  Needs more time to help. Extensive discussion previously about her ambivalence about meds and missatributing sx of depression as SE of meds.    She is tolerating Auvelity.  Continue 1  BID for at least 2 more weeks.  Worse with less pramipexole. Increase to 0.5 mg TID  Dosing range off label for depression ranges from 1-5 mg daily.  Disc risk compulsive impulsive behavior and manis.  She wants to increase back up for depression.  Started Spravato 84 mg twice weekly on 03/16/2022.  Now on weekly administration  Adderall  XR 20 mg AM   Ativan 1 mg 3 times daily as needed anxiety but try to cut it back. Is not ideal to use  benzodiazepine with stimulant but because of the severity of her symptoms it has been necessary.  Hope to eventually eliminate the benzodiazepine.  Expected as her depression improves her anxiety will improve as well.  However lately her anxiety has been unmanageable.  We will expect that to improve as the depression improves.  She has headed insturctions to reduce  this.  Continue clonidine 0.2 mg BID off label for anxiety and helps BP partially. BP is better controlled but not consistent.  Consider increasing amlodipine.  Also on losartan 50  Discussed potential benefits, risks, and side effects of stimulants with patient to include increased heart rate, palpitations, insomnia, increased anxiety, increased irritability, or decreased appetite.  Instructed patient to contact office if experiencing any significant tolerability issues. She wants to return to usual dose of Adderall for ADD bc of mor poor cognitive function with reduction.  Also discussed that depression will impair cognitive function.  [place] consultation was initially denied .  She says she called [facility] about this but they have not scheduled an appt .Marland Kitchen   Consider consultation with [facility] if Id[facility] Crouch is not an option.  We discussed the pros and cons of this versus Spravato.  For now she wants to continue Spravato because it is clearly helpful.  Extensive discussion about her chronic ambivalence about psychiatric medicines and tendency to blame medicine for the symptoms of depression that she had prior to even starting the medications.  After discussing this at length she was able to acknowledge that that is factual.  She does not appear to be having any significant side effects with the current medications.  However so far not much benefit with nortriptyline despite adequate level.  Disc risk pllypharmacy  Rec keep track of BP and discuss with PCP  Has Maintained sobriety  FU with Spravato weekly   Lynder Parents, MD,  DFAPA     Please see After Visit Summary for patient specific instructions.  Future Appointments  Date Time Provider [place]  01/15/2023  1:00 PM Cottle, Billey Co., MD CP-CP None  01/15/2023  1:00 PM CP-NURSE CP-CP None                  No orders of the defined types were placed in this encounter.      -------------------------------

## 2023-01-11 ENCOUNTER — Encounter: Payer: Self-pay | Admitting: Psychiatry

## 2023-01-11 NOTE — Progress Notes (Signed)
NURSES NOTE:   Patient arrived for her weekly '84mg'$  Spravato treatment. Pt is being treated for Treatment Resistant Depression, pt will be receiving 84 mg which will continue to be her maintenance dose, she receives weekly treatments.  Pt is also planning on [place] maybe. Patient arrived and taken to treatment room. Confirmed she had a ride home which is her husband would be coming back to pick up pt when done and sometimes she needs to use [facility] Popper if he is unable to pick her up. Pt's Spravato is ordered through [employer] and delivered to office, all Spravato medication is stored at doctors office per REMS/FDA guidelines. The medication is required to be locked behind two doors per FDA/REMS Protocol. Medication is also disposed of properly per regulations.   Began taking patient's vital signs at 1:30 PM 113/83, pulse 93. Pulse Ox 99%. Gave patient first dose 28 mg nasal spray, each nasal spray administered in each nostril as directed and waited 5 minutes between the second and third dose. All 3 doses given pt did not complain of any nausea/vomiting, given a cup of water due to the taste after the administration of Spravato.  She listens to [employer] with spa or relaxing music.  Checked 40 minute vitals at 2:10 PM, 97/62, pulse 82, Pulse Ox 92%. Explained she would be monitored for a total time of 120 minutes. Discharge vitals were taken at 3:30 PM 131/62 P 82, 99% Pulse Ox. Dr. Clovis Pu met with pt today and discussed her medication. I walked pt to elevator, where her husband met her for the ride home. Recommend she go home and sleep or just relax on the couch. No driving, no intense activities. Verbalized understanding. Nurse was with pt a total of 70 minutes for clinical. Pt is scheduled next Monday, February 27th, 2024. Pt instructed to call office with any problems or questions.      LOT [id number] EXP FEB 2027

## 2023-01-14 ENCOUNTER — Encounter: Payer: 59 | Admitting: Psychiatry

## 2023-01-15 ENCOUNTER — Ambulatory Visit: Payer: 59

## 2023-01-15 ENCOUNTER — Telehealth: Payer: Self-pay

## 2023-01-15 ENCOUNTER — Ambulatory Visit (INDEPENDENT_AMBULATORY_CARE_PROVIDER_SITE_OTHER): Payer: 59 | Admitting: Psychiatry

## 2023-01-15 VITALS — BP 89/63 | HR 52

## 2023-01-15 DIAGNOSIS — F5105 Insomnia due to other mental disorder: Secondary | ICD-10-CM | POA: Diagnosis not present

## 2023-01-15 DIAGNOSIS — F411 Generalized anxiety disorder: Secondary | ICD-10-CM | POA: Diagnosis not present

## 2023-01-15 DIAGNOSIS — I10 Essential (primary) hypertension: Secondary | ICD-10-CM

## 2023-01-15 DIAGNOSIS — F339 Major depressive disorder, recurrent, unspecified: Secondary | ICD-10-CM | POA: Diagnosis not present

## 2023-01-15 DIAGNOSIS — F900 Attention-deficit hyperactivity disorder, predominantly inattentive type: Secondary | ICD-10-CM

## 2023-01-15 MED ORDER — AUVELITY 45-105 MG PO TBCR: 1.0000 | EXTENDED_RELEASE_TABLET | Freq: Two times a day (BID) | ORAL | 1 refills | Status: DC

## 2023-01-15 NOTE — Telephone Encounter (Signed)
Prior Authorization submitted and approved fpr Spravato 84 mg #3 per 7 days with [facility] effective through 07/17/2023. [id number]

## 2023-01-15 NOTE — Progress Notes (Signed)
NURSES NOTE:   Patient arrived for her weekly '84mg'$  Spravato treatment. Pt is being treated for Treatment Resistant Depression, pt will be receiving 84 mg which will continue to be her maintenance dose, she receives weekly treatments.  Pt is also planning on [place] maybe. Patient arrived and taken to treatment room. Confirmed she had a ride home which is her husband would be coming back to pick up pt when done and sometimes she needs to use [facility] Popper if he is unable to pick her up. Pt's Spravato is ordered through [employer] and delivered to office, all Spravato medication is stored at doctors office per REMS/FDA guidelines. The medication is required to be locked behind two doors per FDA/REMS Protocol. Medication is also disposed of properly per regulations.   Began taking patient's vital signs at 1:15 PM 115/65, pulse 68. Pulse Ox 99%. Gave patient first dose 28 mg nasal spray, each nasal spray administered in each nostril as directed and waited 5 minutes between the second and third dose. All 3 doses given pt did not complain of any nausea/vomiting, given a cup of water due to the taste after the administration of Spravato.  She listens to [employer] with spa or relaxing music.  Checked 40 minute vitals at 2:00 PM, 101/69, pulse 70, Pulse Ox 92%. Explained she would be monitored for a total time of 120 minutes. Discharge vitals were taken at 3:00 PM 89/63 P 52, 99% Pulse Ox. Dr. Clovis Pu met with pt today and discussed her medication. I walked pt to elevator, where her husband met her for the ride home. Recommend she go home and sleep or just relax on the couch. No driving, no intense activities. Verbalized understanding. Nurse was with pt a total of 70 minutes for clinical. Pt is scheduled next Monday, 12th, 2024. Pt instructed to call office with any problems or questions.      LOT [id number] EXP JAN 2027

## 2023-01-18 ENCOUNTER — Encounter: Payer: Self-pay | Admitting: Psychiatry

## 2023-01-18 NOTE — Progress Notes (Signed)
EMAHNI MEINHART [id number] 03/20/1956 67 y.o.  Subjective:   Patient ID:  Laura Chang is a 67 y.o. (DOB 07-26-1956) female.  Chief Complaint:  Chief Complaint  Patient presents with   Follow-up   Depression   Anxiety   ADD    f      Laura Chang presents to the office today for follow-up of depression and anxiety and ADD.  seen November 12, 2019.  Melted down in 2020.  Went to [employer] in July.  No withdrawal.  1 drink since then.  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 She was on no stimulant and no SSRI but was taking Strattera and Wellbutrin.  The following changes were made. Stop Strattera. OK restart stimulant bc severe ADD Restart Adderall 1 daily for a few days and if tolerated then restart 1 twice daily. If not tolerated reduce the dosage if needed. May need to stop Wellbutrin if not tolerating the stimulant.  Yes.  DC Wellbutrin Restart Prozac 20 mg daily.  February 2021 appointment with the following noted: Completed grant proposal.  Couldn't doit without Adderall.  Sold a bunch of work.   Adderall XR lasts about 3 pm.  Strength seems about right.  BP been OK.  Not jittery.   Stopped Wellbutrin but had no SE. Mood drastically better with grant proposal and back on fluoxetine.  Less depressed and lethargic.  No anxiety.  Cut back on coffee. Started back with devotions and stronger faith. Plan: Continue Prozac 20 mg daily. May have to increase the dose at some point in the future given that she usually was taking higher dosages but she is getting good response at this time. Restart Wellbutrin off label for ADD since can't get 2 ADDERALL daily. 150 mg daily then 300 mg daily. She can adjust the dose between 150 mg and 300 mg daily to get the optimal effect.   05/11/2020 appointment with the following noted: Has been inconsistent with Prozac and Wellbutrin. Not sure of the effect of Wellbutrin. Biggest deterrent in work is  anxiety.  Some of the work is conceptual and difficult at times.  Can feel she's not up to a project at times.  Overall is OK but would like a steadier benefit from stimulant.  Exhausted from managing concentration and keeping up with things from the day.  Loses things.  Not good keeping up with schedule. Overall productive and emotionally OK. Can feel Adderall wear off. Mood is better in summer and worse in the winter.   F died in October 01, 2023 and that is a loss. No SE Wellbutrin. Still attends AA meetings.  Real benefit from [place] last year. Recognizes effect of anemia on ADD and mood.  Had iron infusions last winter. Plan:  Wellbutrin off label for ADD since can't get 2 ADDERALL daily. 150 mg daily then 300 mg daily.  01/24/2021 appointment with following noted: Doing a program called [employer] mindfulness app since Xmas.  CBT app helped the depression.  App helped her focus better.  Lost sign weight. Writing a lot. Before Xmas felt depressed and started negative thinking worse, self denigrating. Not drinking. More isolated.   Recognizes mo is narcissist.    Didn't tell anyone she was born until 3 mos later.  M aloof and uninterested in pt.  Lied about her birthday.  Mo lack of affection even with pt's kids. Going to [place] for a year and it helped her to quit drinking. Also  misses kids being gone with a hole also. Plan: No med changes  05/04/2021 appointment with the following noted: Therapist [name] thinks she's manic. Lost weight to 144#.   States she is still sleeping okay.  Admits she is hyper and recognizes that she is likely manic.  She feels great, euphoric with an increased sense of spiritual connectedness to God.  She has racing thoughts and talks fast and talks a lot and this is noted by her husband.  He thinks she is a bit hyper.  She has been able to maintain sobriety although she will have 1 glass of wine on special occasions but does not drink by herself.  She is not  drinking to excess.  She denies any dangerous impulsivity.  She is clearly not depressed and not particularly anxious.  She has no concerns about her medication and she has been compliant.  06/16/21 appt noted: So much better.  Going through a lot but the manic thing happened on top of it.  So much slower.  Didn't feel like losing anything with risperidone.  Likes the Adderalll at 10 mg. Some drowsiness in the AM and very drowsy from risperidone 2 mg HS. Prayer life is better. Handling stress better. Less depressed with risperidone. Still likes trazodone. Sleeps well. Plan: Reduce Prozac to 10 mg daily.  Consider stopping it because it can feel the mania however she is reluctant to do that because she fears relapse of depression. Reduce risperidone to 1.5 mg nightly due to side effects.  Discussed risk of worsening mania.  07/25/2021 appointment with the following noted: Misses the Adderall and hard to function without it. Depressed now. Heavy chest.  Anxious and guilty.  Body feels heavy.   Hates Wellbutrin.   Plan: Increase fluoxetine to 20 mg daily Add Abilify 1/2 of 15 mg tablet daily Wean wellbutrin by 1 tablet each week  bc she feels it is not helpful and DT polypharmacy Reduce risperidone to 1 daily for 1 week and stop it. Disc risk of mania. Increase Adderall to XR 20 mg AM  08/08/21 Much less depressed and starting to feel normal I feel a lot better. No SE.  Speech normal off risperidone. Sleeping OK on trazaodone and enough.   Noticed benefit from Adderall again. Plan: continue fluoxetine to 20 mg daily Continue Abilify 1/2 of 15 mg tablet daily for depression and mania continue Adderall to XR 20 mg AM  10/10/2021 phone call: Pt stated she feels like the Abilify should be decreased to '5mg'$ .She said she is depressed but rational and not suicidal.She has an appt Monday and can wait until then if you prefer. MD response: Reduce the Abilify to 7.5 mg every other day.  We will meet on  10/16/2021 and decide what to do from there.  10/16/2021 appointment with the following noted: More depressed.  Most depressed I've ever been.  Just numb.  Sense of grief.   Thinks the manic episode was unlike anything else she ever had.  Doesn't want to medicate against it.  Don't enjoy people.  Easily overwhelmed.  Had some death thoughts but not suicidal.  Has been functional.  Feels better today after reducing Abilify to every other day but she is only been doing that for 3 days. A/P: Episode of post manic depression was explained. continue fluoxetine to 20 mg daily Hold Abilify for 1 week then resume Abilify 1/2 of 15 mg tablet every other day for depression and mania continue Adderall to XR 20  mg AM  10/27/2021 appointment with the following noted: I'm doing so much better.  Handling the depression better. Better self talk and spiritual focus has helped.   Dep 6/10 manifesting as anxiety with low confidence.   F died 2  years ago and M 67 yo and is dependent . She is working hard to feel better but still feels depressed.  She almost feels like she has a little more anxiety since restarting Abilify every other day. Plan: continue fluoxetine to 20 mg daily DC Abilify .  Vrayalar 1.5 mg QOD to try to get rid of depression ASAP. continue Adderall to XR 20 mg AM  11/10/2021 appointment with the following noted: Busy with Xmas and it was fun with family but then a big let down.  Did well with it.  Functioned well with it.  Working hard on things with depression.  Not shutting down. Not sure but feels better today but yesterday was hard.  Difficulty dealing with mother.  She won't do anything to help herself.  Yesterday with her all day.  Won't do PT and has isolated herself.    Lack of confidence.   No SE with Vraylar.  11/24/21 urgent appointment appt noted: More and more depressed.   So anxious and doesn't want to be alone but can do so. No appetite. Hurts inside. Has had some fleeting  suicidal thoughts but would not act on them.  Tolerating meds. Has been consistent with Vraylar 1.5 mg every other day, fluoxetine 20 mg daily Plan: Increase Vraylar to 1.5 mg daily Change Prozac to Trintellix 10 mg daily. Discussed side effects of each continue Adderall to XR 20 mg AM  12/27/2021 appointment with the following noted: Not OK.  I feel less depressed but feels bat shit. Not sleeping well.  Extremely anxious. Off and on sleep. 3-4 hours of sleep.   Still having daily SI.  But also become obvious has so much to do.  Overwhelmed by tasks.   Needs anxiety meds to just function. Not more motivated.  Walked yesterday.   Feels afraid like in trouble but not irritable or angry. DC DT agitation Vraylar to 1.5 mg daily Change Prozac to Trintellix 10 mg daily. Hold Adderall to XR 20 mg AM Clonidine 0.1 1/2 tablet twice daily for 2 days and if needed for anxiety and sleep increase to 1 twice daily Ok temporary Ativan 1 mg 3 times daily as needed anxiety  01/05/22 appt noted: Off fluoxetine and  Trintellix.  Only on Ativan, trazodone and Adderall XR 20 plus added clonidine 0.1 mg BID Didn't think she needed to start Trintellix. Not taking Ativan.   Didn't like herself last week. Feels some better today. Wonders if the manic sx Not agitated.  Anxiety kind of calmed down.  A lot to be anxious about situationally.  $ stress. Concerns about downers with meds. Can't access normal personality. ? Lethargy and inability to talk as sE. Plan: Latuda 20-40 mg daily with food. Adderall to XR 20 mg AM Clonidine 0.1 1/2 tablet twice daily  reduce dose to be sure no SE Ok temporary Ativan 1 mg 3 times daily as needed anxiety  01/19/22 appt noted: Taking Latuda 20 mg daily.  Took 40 mg once and felt anxious and  SI Still depressed and not very reactive Anxiety mainly about the depression and fears of the future. She wants to revisit manic sx and thinks it was maybe bc taking delta 8 bc was  taking a lot  of it so still doesn't think she's classic bipolar. She wants to only take Prozac bc thinks Latuda is perpetuating depression. Says the delta 8 was very psychaedelic.  When not taking it was not manic.  Sleeping ok again.  Plan: Per her request DC Latuda 20-40 mg daily with food. She wants to continue Prozac alone AMA  Adderall to XR 20 mg AM Clonidine 0.1 1/2 tablet twice daily  reduce dose to be sure no SE Ok temporary Ativan 1 mg 3 times daily as needed anxiety  01/23/2022 phone call complaining of increased anxiety since stopping Latuda.  She will try increasing clonidine.  01/26/2022 phone call not feeling well and wanted to restart the Vraylar.  However notes indicate that had made her agitated therefore she was encouraged to pick up samples of Rexulti 1 mg and start that instead.  02/06/2022 phone call: Stating she felt the Rexulti was helping with depression but she was not sleeping well and obsessing over things.  She was encouraged to increase Rexulti to 2 mg daily and increase trazodone for sleep.  02/09/2022 appointment with the following noted: This was an urgent work in appointment No sleep last night with trazodone 100 mg HS Nothing really better depression or anxiety. Ruminating negative anxious thoughts. Did not tolerate Rexulti because it was causing insomnia.  Does not think it helped depression.  Lacks emotion that she should have.  Lacks her usual personality.  Some hopeless thoughts.  Some death thoughts.  Some suicidal thoughts without plan or intent Plan: DC Rexulti and Prozac & DC trazodone Adderall to XR 20 mg AM Clonidine 0.1 1/tablet twice daily  reduce dose to be sure no SE Ok temporary Ativan 1 mg 3 times daily as needed anxiety Start Seroquel XR 150 mg nightly  03/02/2022 appointment: Langley Gauss called back a few days after starting Seroquel stating it was making her more anxious and more depressed.  This seemed unlikely as this medicine rarely ever  causes anxiety.  She stopped the medication waited 3 days and called back still had anxiety and depression but thought perhaps the anxiety was a little better.  She did not want to take the Seroquel. She knew about the option of Spravato and wanted to pursue that. Now questions whether to return to Seroquel while waiting to start Spravato bc feels just as bad without it and knows she didn't give it enough time to work.   MADRS 46  ECT-MADRS    Flowsheet Row Clinical Support from 01/08/2023 in [place] from 08/06/2022 in [place] from 07/04/2022 in [place] from 05/21/2022 in [place] Office Visit from 03/02/2022 in [facility]  MADRS Total Score '21 29 15 27 '$ 46      03/14/22 appt noted: Pt received Spravato 56 mg first dose today with some dissociative sx which were not severe.  She was anxious prior to the administration and felt better after receiving lorazepam 1 mg.  No NV, or HA. Wants to continue Spravato. Ongoing depression and desperate to feel better.  I'm not myself DT deprsssion which is most severe in recent history.  Anhedonia.  Low motivation.  Social avoidance. Continues to think all recent med trials are making her worse.  Sleep ok with Seroquel.  03/16/22 appt noted: Received Spravato 84 mg for the first time.  some dissociative sx which were not severe.  She  was anxious prior to the administration and felt better after receiving lorazepam 1 mg.  No NV, or HA. Wants to continue Spravato.   Does not feel any better or different since the last appt.  Ongoing depression.  Ongoing depression and desperate to feel better.  I'm not myself DT deprsssion which is most severe in recent history.  Anhedonia.  Low motivation.  Social avoidance. Continues to think all recent med trials are making  her worse.  Sleep ok with Seroquel.  Does not want to continue Seroquel for TRD.  03/20/2022 appointment noted: Came for Spravato administration today.  However blood pressure was significantly elevated approximately 180/115.  She was given lorazepam 1 mg and clonidine 0.2 mg to try to get it down. She states she regretted stopping the Seroquel XR 300 mg tablets.  She now realizes it was helpful.  She did not sleep much at all last night.  She did not take the Adderall this morning. 2 to 3 hours after arrival blood pressure was still elevated at  170/110, 62 pulse.  For Spravato administration was canceled for today.  She admits to being anxious and depressed.  She is not suicidal.  She is highly motivated to receive the Spravato.  We discussed getting it tomorrow.  03/22/2022 appointment noted: Patient's blood pressure was never stable enough yesterday in order to get her in for Spravato administration.  She was encouraged to see her primary care doctor.  It is better today.  03/26/2022 appointment with the following noted: Blood pressure was better.  Saw her primary care doctor who started on oral Bystolic 2.5 mg daily. Received Spravato 84 mg today as scheduled.  Tolerated it well without nausea or vomiting headache or chest pain or palpitations.  Her blood pressure was borderline but manageable. She remains depressed and anxious.  She is ambivalent about the medicine and desperate to get to feel better.  Continues to have anhedonia and low energy and low motivation and reduced ability to do things.  Less social.  Not suicidal.  03/28/22 appt noted: Received Spravato 84 mg today as scheduled.  Tolerated it well without nausea or vomiting headache or chest pain or palpitations.  Her blood pressure was borderline but manageable. Has not seen any improvement so far.  Tolerating Seroquel.  Inconsistent with Bystolic and BP has been borderline high. Still depressed and anxious and anhedonia.  Low  motivation, energy, productivity. Taking quetiapine and tolerating XR 300 mg nightly.  04/04/22 appt noted: Received Spravato 84 mg today as scheduled.  Tolerated it well without nausea or vomiting headache or chest pain or palpitations.  Her blood pressure was borderline but manageable. Has not seen any improvement so far.  Tolerating Seroquel.   She still tends to think that the medications are making her worse.  She has said this about each of the recent psychiatric medicines including Seroquel.  However her husband thinks she is improved.  She also admits there is some improvement in productivity.  She still feels highly anxious.  She still does not enjoy things as normal.  She still feels desperate to improve as soon as possible. Has been taking Seroquel XR since 03/20/2022  04/10/22 appt noted: Received Spravato 84 mg today as scheduled.  Tolerated it well without nausea or vomiting headache or chest pain or palpitations.  Her blood pressure was borderline but manageable. Has not seen any improvement so far.  Tolerating Seroquel.  Doesn't like Seroquel bc she thinks it flattens here. 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  Tolerated it well without nausea or vomiting headache or chest pain or palpitations.  Her blood pressure was borderline but manageable. Has not seen any improvement so far.  Tolerating Seroquel.  Doesn't like Seroquel bc she thinks it flattens her. Received Spravato 84 mg today as scheduled.  Tolerated it well without nausea or vomiting headache or chest pain or palpitations.  Her blood pressure was borderline but manageable. Has not seen any improvement so far.  Tolerating Seroquel.  Doesn't like Seroquel bc she thinks it flattens here.  We discussed her ambivalence about it. She is starting Auvelity  and has tolerated it the last 2 days without side effect.  She still does not feel like herself and feels flat and not enjoying things with suppressed expressed emotion  04/17/2022 appointment with the following noted: Received Spravato 84 mg today as scheduled.  Tolerated it well without nausea or vomiting headache or chest pain or palpitations.  Her blood pressure was borderline but manageable. Has not seen any improvement so far.  Tolerating Seroquel.  Doesn't like Seroquel bc she thinks it flattens her. She has been tolerating the Auvelity 1 in the morning without side effects for about a week.  She has not noticed significant improvement so far.  She still feels depressed and flat and not herself.  Other people notice that she is flat emotionally.  She is not suicidal.  She does feel discouraged that she is not getting better yet.  04/19/2022 appointment noted: Has increased Auvelity to 1 twice daily for 2 days, continues quetiapine XR 300 mg nightly, clonidine 0.3 mg twice daily, lorazepam 1 mg twice daily for anxiety and Adderall XR 20 mg in the morning. No obious SE but she still thinks quetiapine XR is making her feel down.  But not sedated Received Spravato 84 mg today as scheduled.  Tolerated it well without nausea or vomiting headache or chest pain or palpitations.  Her blood pressure was borderline but manageable. She still feels quite anxious and feels it necessary to take both the clonidine and lorazepam twice a day to manage her anxiety.  She has been consistently down and flat and not herself until yesterday afternoon she noted an improvement in mood and feeling much more like herself with her normal personality reemerging.  She was quite depressed in the morning with very dark negative thoughts.  She did not have those dark negative thoughts this morning.  She had a lot of questions about medication and when she was expecting to be improved and why she has not shown improvement up to  now.  04/23/22 appt noted: Has increased Auvelity to 1 twice daily for 1 week, continues quetiapine XR 300 mg nightly, clonidine 0.3 mg twice daily, lorazepam 1 mg twice daily for anxiety and Adderall XR 20 mg in the morning. No obious SE but she still thinks quetiapine XR is making her feel down.  But not sedated Received Spravato 84 mg today as scheduled.  Tolerated it well without nausea or vomiting headache or chest pain or palpitations.  She is still depressed but admits better function and is able to enjoy social interactions. Tolerating meds.  Would like to feel better for sure. Not herself.  Flat. Plan increase Auvelity to 1 tab BID as planned and reduce Quetiapine to 1/2 of ER 300 mg  bc  NR for depression.  04/25/2022 appointment with the following noted: clonidine 0.3 mg twice daily, lorazepam 1 mg twice daily for anxiety and Adderall XR 20 mg in the morning. Seroquel XR 300 HS No obious SE but she still thinks quetiapine XR is making her feel down.  But not sedated Received Spravato 84 mg today as scheduled.  Tolerated it well without nausea or vomiting headache or chest pain or palpitations.  Called yesterday with more anxiety.  Had increased Auvelity for 1 day and reduced Seroquel XR for 1 day.  Felt restless and fearful  05/01/2022 appointment noted: clonidine 0.3 mg twice daily, lorazepam 1 mg twice daily for anxiety and Adderall XR 20 mg in the morning. Seroquel XR 150 HS, Auvelity 1 BID Received Spravato 84 mg today as scheduled.  Tolerated it well without nausea or vomiting headache or chest pain or palpitations.  Nurse has noted patient has called multiple times sometimes asking the same question repeatedly.  It is unclear whether she is truly forgetful or is just anxious seeking reassurance. Patient acknowledges ongoing depression as well as some anxiety but states she has felt a little better in the last couple of days.  She has reduced the Seroquel to 150 mg at night and has  increased Auvelity to 1 twice daily but only for 1 day.  So far she seems to be tolerating it.  05/03/22 appt noted: clonidine 0.2 mg twice daily, lorazepam 1 mg twice daily for anxiety and Adderall XR 20 mg in the morning. Seroquel XR 150 HS, Auvelity 1 BID BP high this am about 170/100 and received extra clonidine 0.2 mg and came to receive Spravato.  Not dizzy, no SOB, nor CP but BP is still high Could not receive Spravato today bc BP high and pulse low at 30 ppm. Still depressed and anxious. Plan: continue trial Auvelity with Spravato She needs to get BP and pulse managed  05/08/22 TC: RTC  H Michael NA and mailbox full.  Could not leave message.  Pt  -  talked to she and H on speaker. H worried over wife.  Vacant stare.  Slurs words at times.  Not smiling. Reduced enjoyment.  Depression.  Withdrawn from usual activities.  Some irritability.  Anxious. Disc her concerns meds are making her worse.  Extensive discussion about her treatment resistant status.  There is a consistent pattern of not taking the medicines long enough to get benefit because she believes the meds are making her worse.  However the symptoms she describes as side effects are exactly the same symptoms that she had prior to taking the medication RX for  the depression.  So it is not clear that these are actual side effects. This is true about the 2 most recent meds including Seroquel and Auvelity.  Recommend psychiatric consultation in hopes of improving her comfort level with taking prescribed medications for a sufficient length of time to provide benefit. Extensive discussion about ECT is the treatment of choice for treatment resistant depression.  Spravato may work if she can comply with consistency.  There are medication options but they take longer to work.   Plan:  Reduce clonidine to 0.1 mg BID DT bradycardia.  Talk with PCP about BP and low pulse problems which are interfering with her consistent compliance with  Spravato.   Limit lorazepam to 3 -4  mg daily max. Excess use is the cause of slurring speech.  She must stop excess use or will have to stop the  med. Stop Auvelity per her request.  But she has only been on the full dose for a little over a week and clearly has not had time to get benefit from it.  She thinks maybe it is making her more anxious. Reduce Seroquel from 150XR to 50 -100 mg at night IR.  She couldn't sleep when stopped it completely. Will not start new antidepressant until her SE issues are resolved or not. Get second psych opinion from Yehuda Budd MD or another psychiatrist.  H's sister is therapist in [place] [facility], MD, [facility]  05/16/2022 appointment with the following noted: Received Spravato 84 mg today as scheduled.  Tolerated it well without nausea or vomiting headache or chest pain or palpitations.  She stopped Auvelity as discussed last week. On her own, without physician input, she restarted Wellbutrin XL 450 mg every morning today.  She had taken it in the past.  She feels jittery and anxious. She feels less depressed than she did last week.  But she is still depressed without her usual range of affect.  She still is less social and less motivated than normal. Her primary care doctor increased the dose of losartan Plan: Stop Seroquel Reduce Wellbutrin XL to 300 mg every morning.  Starting the dose at 450 every morning is likely causing side effects of jitteriness and it should not be started at that have a dosage. Recommend she not change meds on her own without MDM put  05/23/2022 appointment with the following noted: Received Spravato 84 mg today as scheduled.  Tolerated it well without nausea or vomiting headache or chest pain or palpitations.  Has not dropped seroquel XR 300 mg 1/2 tablet nightly bc couldn't sleep without it. Has not tried lower dose quetiapine 50 mg HS Still feels depressed.   BP is better managed so far, just saw PCP.  BP is better today and  infact is low today. Dropped clonidine as directed from 0.3 mg BID bc inadequate control of BP to 0.2 mg BID.  However she wants to increase it back to 0.3 mg twice daily because she feels it helped her anxiety better.  Wonders about increasing Wellbutrin for depression.  However she has only been on 300 mg a day for a week.  She was on 450 mg daily in the past.  06/06/22 appt noted: Received Spravato 84 mg today as scheduled.  Tolerated it well without nausea or vomiting headache or chest pain or palpitations.  She is still depressed and anxious.  She wants to try to stop the Seroquel but cannot sleep without some of it.  She is taking lorazepam 1 mg 4 times daily and still having a lot of anxiety.  She wants to increase clonidine back to 0.3 mg twice daily.  She hopes for more improvement She recently went for a second psychiatric opinion as suggested the results of that are pending.  06/11/22 appt noted: Received Spravato 84 mg today as scheduled.  Tolerated it well without nausea or vomiting headache or chest pain or palpitations.  She is still depressed and anxious. Without much change.  Still hopeless, anhedonia, reduced inteterest and motivation.  Tolerating meds. Disc concerns Spravato is not hleping much. Plan: stop Seroquel and start olanzapine 10 mg HS for TRD and anxiety.  06/13/2022 appointment noted: Received Spravato 84 mg today as scheduled.  Tolerated it well without nausea or vomiting headache or chest pain or palpitations.  She is still depressed and anxious. Without much change.  Still  hopeless, anhedonia, reduced inteterest and motivation.  Tolerating meds. Disc concerns Spravato is not helping much as hoped but is improving a bit in the last week. Tolerating meds. Continues Wellbutrin XL 450 AM, tolerating recently started olanzapine  10 mg HS. Sleep is good.   Pending appt with [place] consult.  06/18/22 appt noted: Received Spravato 84 mg today as scheduled.  Tolerated it well  without nausea or vomiting headache or chest pain or palpitations.  Tolerating meds. Continues Wellbutrin XL 450 AM, tolerating recently started olanzapine  10 mg HS. Continues Adderall XR 20 amd and has tried to reduce lorazepam to '1mg'$  TID Sleep is good.   Pending appt with [facility] consult. Depression is a little bit better in the last week with a little improvement in emotional expression and interest.  She is pushing herself to be more active.  Her daughter thought she was a little better than she has been.  However she is still depressed and still not her normal self with anhedonia and reduced emotional expressiveness.  06/20/22 appt noted: Received Spravato 84 mg today as scheduled.  Tolerated it well without nausea or vomiting headache or chest pain or palpitations.  Tolerating meds with a little sleepiness. Continues Wellbutrin XL 450 AM, tolerating recently started olanzapine  10 mg HS. Continues Adderall XR 20 amd and has tried to reduce lorazepam to '1mg'$  TID Sleep is good.   Mood is improving.  Better funciton.  Anxiety is better with olanzapine. Still not herself and depression not gone with some anhedonia and social avoidance and feeling overwhelmed.  8/14 2023 received Spravato 84 mg 06/27/2022 received Spravato Spravato 84 mg 07/02/2022 received Spravato 84 mg 07/04/2022 received Spravato 84 mg  07/09/2022 appointment noted: Received Spravato 84 mg today as scheduled.  Tolerated it well without nausea or vomiting headache or chest pain or palpitations.  Expected dissociation and feels less depressed with resolution of negative emotions immediately after Spravato and then depression, anxiety creep back in. Continues meds Adderall XR 20 mg every morning, Wellbutrin XL 450 every morning, clonidine 0.1 mg twice daily, lorazepam 1 mg every 6 hours as needed, olanzapine increased from 7.5 to 10 mg nightly on  Tolerating meds.  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  She does note some increased appetite and is somewhat concerned about that but has not gained significant amounts of weight. She has had the [place] consultation which was initially denied but she knows it can be appealed.  However because she is improving with Spravato plus the other medications now she wants to continue the current treatment plan.  07/18/22 appt noted: Continues meds Adderall XR 20 mg every morning, Wellbutrin XL 450 every morning, clonidine 0.1 mg twice daily, lorazepam 1 mg every 6 hours as needed, olanzapine increased from 7.5 to 10 mg nightly on 07/04/2022. Received Spravato 84 mg today as scheduled.  Tolerated it well without nausea or vomiting headache or chest pain or palpitations.  Expected dissociation and feels less depressed with resolution of negative emotions immediately after Spravato and then depression, anxiety creep back in. Continues meds Adderall XR 20 mg every morning, Wellbutrin XL 450 every morning, clonidine 0.1 mg twice daily, lorazepam 1 mg every 6 hours as needed, olanzapine increased from 7.5 to 10 mg nightly on  Tolerating meds.  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  She does note some increased appetite and is somewhat concerned about that but has not gained significant amounts of weight. She has had the [place] consultation which was initially denied but she knows it can be appealed. She continues to have chronic ambivalence about psychiatric medicines and initially tends to blame her depressive symptoms such as decreased concentration and feeling flat on what ever medicine she currently is taking even though she had the same symptoms before the current medicines were started.  Then after discussion she does admit that her depressive  symptoms are improved since adding olanzapine but still has those residual symptoms noted.  07/23/22 received Spravato 84 mg   07/30/2022 appointment noted: Received Spravato 84 mg today as scheduled.  Tolerated it well without nausea or vomiting headache or chest pain or palpitations.  Expected dissociation and feels less depressed with resolution of negative emotions immediately after Spravato and then depression, anxiety creep back in. Continues meds Adderall XR 20 mg every morning, Wellbutrin XL 450 every morning, clonidine 0.1 mg twice daily, lorazepam 1 mg every 6 hours as needed, olanzapine increased from 7.5 to 10 mg nightly on  She has been inconsistent with olanzapine because she continues to be ambivalent about the medications in general and thinks that perhaps the 10 mg is making her feel blunted.  She continues to feel some depression.  She had a good day this week and but still feels somewhat depressed and persistently anxious. Plan: be consistent with olanzapine 10 mg HS for TRD and longer trial for potential benefit for anxiety.  Has not taken it consistently.  08/06/2022 appointment noted: Received Spravato 84 mg today as scheduled.  Tolerated it well without nausea or vomiting headache or chest pain or palpitations.  Expected dissociation and feels less depressed with resolution of negative emotions immediately after Spravato and then depression, anxiety creep back in. Continues meds Adderall XR 20 mg every morning, Wellbutrin XL 450 every morning, clonidine 0.1 mg twice daily, lorazepam 1 mg every 6 hours as needed, olanzapine i 10 mg nightly  She continues to feel depressed but is about 50% better with Spravato.  She is still not herself.  She still has anhedonia.  She still is not her able to engage socially in the typical ways.  She is not jovial and outgoing like normal.  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  She continues to feel that her personality is dampened down.  There is a question about whether it is related to depression or medication. Plan: continue olanzapine 10 for longer trial for TRD and severe anxiety.  08/13/22 appt noted:  Received Spravato 84 mg today as scheduled.  Tolerated it well without nausea or vomiting headache or chest pain or palpitations.  Expected dissociation and feels less depressed with resolution of negative emotions immediately after Spravato and then depression, anxiety creep back in. Continues meds Adderall XR 20 mg every morning, Wellbutrin XL 450 every morning, clonidine 0.1 mg twice daily, lorazepam 1 mg every 6 hours as needed, olanzapine i 10 mg nightly  She still does not feel herself.  Still struggles with depression and low motivation and reduced social engagement and reduced interest and reduced emotional expression.  She is somewhat better with the medicines plus Spravato.  She still believes the [employer]  makes her blunted and is not sure how much it helps her anxiety.  She can have good days when her family is around and she is engaged.  She still wants to stop the olanzapine. She has apparently continued to take the trazodone despite having been told to stop it when she started olanzapine.  She feels like she needs the trazodone. Plan: DC olanzapine and Start nortriptyline 25 mg nightly and build up to 75 mg nightly and then check blood level.    08/27/2022 appointment noted: Received Spravato 84 mg today as scheduled.  Tolerated it well without nausea or vomiting headache or chest pain or palpitations.  Expected dissociation and feels less depressed with resolution of negative emotions immediately after Spravato and then depression, anxiety creep back in. Continues meds Adderall XR 20 mg every morning, Wellbutrin XL 450 every morning, clonidine 0.1 mg twice daily, lorazepam 1 mg every 6 hours as needed. Stopped  olanzapine and started nortriptyline which she has taken for about a week is 75 mg nightly. So far she is tolerating the nortriptyline well with the exception of some dry mouth and constipation which she is working to manage.  She does not feel substantially better better or different off the olanzapine.  No change in her sleep which is good.  Main concern currently in addition to the residual depression is anxiety which is somewhat situational with pending arch show.  She is worrying about it more than normal.  Says she is having to take lorazepam twice a day where she had been able to keep reduce it prior to this.  She still does not feel like herself with residual depression with less social interest and less of her usual buoyancy in personality.  She is flatter than normal.  Overall she still feels that the Spravato has been helpful at reducing the severity of the depression.  She is not suicidal. She has not heard anything about the [place] appeal as of yet.  09/05/2022 appointment noted: Received Spravato 84 mg today as scheduled.  Tolerated it well without nausea or vomiting headache or chest pain or palpitations.  Expected dissociation and feels less depressed with resolution of negative emotions immediately after Spravato and then depression, anxiety creep back in. Continues meds Adderall XR 20 mg every morning, Wellbutrin XL 450 every morning, clonidine 0.1 mg twice daily, lorazepam 1 mg every 6 hours as needed. Stopped olanzapine and started nortriptyline which she has taken for about 2 week is 75 mg nightly. Initially blood pressure was a little high causing delay in starting Spravato.  She admitted to feeling a little wound up.  She still experiences a little increase in depression if she goes longer than a week in between doses of Spravato.  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  09/10/22 appt noted: Received Spravato 84 mg today as scheduled.  Tolerated it well without nausea or vomiting headache or chest pain or palpitations.  Expected dissociation gradually resolved over the 2 hour observation period. She feels 50% less depressed with Spravto and wants to continue it.   Continues meds Adderall XR 20 mg every morning, Wellbutrin XL 450 every morning, clonidine 0.1 mg twice daily, lorazepam 1 mg every 6 hours as needed. Has started nortriptyline 75 mg nightly for about 3 weeks. Has not seen a significant difference with the addition of nortriptyline.  Tolerating it  pretty well. She continues to have some degree of anhedonia and significant depression and anxiety.  Her daughters noticed that she is more needy and calls more frequently.  She acknowledges this as well.  She is clearly still not herself. Plan: pramipexole off label and RX 0.25 mg BID  09/17/2022 appointment noted: Received Spravato 84 mg today as scheduled.  Tolerated it well without nausea or vomiting headache or chest pain or palpitations.  Expected dissociation gradually resolved over the 2 hour observation period. She feels 50% less depressed with Spravto and wants to continue it.   Continues meds Adderall XR 20 mg every morning, Wellbutrin XL 450 every morning, clonidine 0.1 mg twice daily, lorazepam 1 mg every 6 hours as needed. Has started nortriptyline 75 mg nightly for about 3 weeks and DT level 176, reduced to 50 mg HS early November. Still the same sx as noted last visit.  Tolerating meds.   Compliant.  Still depressed and family notices.  Has been able to participate in family interactions.  Some post-show let down and has to do detailed work which is hard for her bc ADD.  Sleep and eating well.  Energy OK but not great.  No SI.  Not cried in a year or so.  Clearly less depressed and hopeless than before the Spravato.  09/24/22 appt noted: Received Spravato 84 mg today as scheduled.   Tolerated it well without nausea or vomiting headache or chest pain or palpitations.  Expected dissociation gradually resolved over the 2 hour observation period. She feels 50% less depressed with Spravto and wants to continue it.   Continues meds Adderall XR 20 mg every morning, Wellbutrin XL 450 every morning, clonidine 0.1 mg twice daily, lorazepam 1 mg every 6 hours as needed. Has started nortriptyline 75 mg nightly for about 3 weeks and DT level 176, reduced to 50 mg HS early November. Still the same sx as noted last visit.  Tolerating meds.   Compliant.  Still depressed and family notices.  Has been able to participate in family interactions.  Some post-show let down and has to do detailed work which is hard for her bc ADD.  Sleep and eating well.  Energy OK but not great.  No SI.  Not cried in a year or so.  Clearly less depressed and hopeless than before the Spravato. Is not making further progress generally.  Stuck with moderate depression  10/02/22 appt noted: Received Spravato 84 mg today as scheduled.  Tolerated it well without nausea or vomiting headache or chest pain or palpitations.  Expected dissociation gradually resolved over the 2 hour observation period. She feels 50% less depressed with Spravto and wants to continue it.   Continues meds Adderall XR 20 mg every morning, Wellbutrin XL 450 every morning, clonidine 0.1 mg twice daily, lorazepam 1 mg every 6 hours as needed. Has started nortriptyline 75 mg nightly for about 3 weeks and DT level 176, reduced to 50 mg HS early November. Still the same sx as noted last visit.  Tolerating meds.   Compliant.  Still depressed and family notices.  Has been able to participate in family interactions.  Some post-show let down and has to do detailed work which is hard for her bc ADD.  Sleep and eating well.  Energy OK but not great.  No SI.  Not cried in a year or so.  Clearly less depressed and hopeless than before the Spravato. Is not making  further progress generally.  Stuck with  moderate depression.  Is able to function pretty normally. Plan: trial pramipexole 0.25 mg BID off label for depression.   10/08/22 appt noted: Received Spravato 84 mg today as scheduled.  Tolerated it well without nausea or vomiting headache or chest pain or palpitations.  Expected dissociation gradually resolved over the 2 hour observation period. She feels 50% less depressed with Spravto and wants to continue it.   Continues meds Adderall XR 20 mg every morning, Wellbutrin XL 450 every morning, clonidine 0.1 mg twice daily, lorazepam 1 mg every 6 hours as needed. Has started nortriptyline 75 mg nightly for about 3 weeks and DT level 176, reduced to 50 mg HS early November. Still the same sx as noted last visit.  Tolerating meds.   Compliant.  Still depressed and family notices.  Has been able to participate in family interactions.  Some post-show let down and has to do detailed work which is hard for her bc ADD.  Sleep and eating well.  Energy OK but not great.  No SI.  Not cried in a year or so.  Clearly less depressed and hopeless than before the Spravato. Is not making further progress generally.  Stuck with moderate depression.  Behaved and felt pretty normally with family over for Thanksgiving. Doesn't see benefit or SE with pramipexole but thinks maybe it makes her worse. Plan:  increase pramipexole augmentation off label to 0.5 mg BID  10/15/2022 appointment noted: Received Spravato 84 mg today as scheduled.  Tolerated it well without nausea or vomiting headache or chest pain or palpitations.  Expected dissociation gradually resolved over the 2 hour observation period. She feels 50% less depressed with Spravto and wants to continue it.   Continues meds Adderall XR 20 mg every morning, Wellbutrin XL 450 every morning, clonidine 0.2 mg twice daily, lorazepam 1 mg every 6 hours as needed.  Trazodone 50 mg tablets 1-2 nightly as needed insomnia Has  started nortriptyline 75 mg nightly for about 3 weeks and DT level 176, reduced to 50 mg HS early November. Recommended increase pramipexole 0.5 mg twice daily off label for treatment resistant depression on 10/08/2022. She feels better motivated more active with pramipexole 0.5 mg twice daily.  She still is depressed but it is better.  We discussed the possibility of going up in the dose but did not change it.  10/22/2022 appointment noted: Received Spravato 84 mg today as scheduled.  Tolerated it well without nausea or vomiting headache or chest pain or palpitations.  Expected dissociation gradually resolved over the 2 hour observation period. She feels 50% less depressed with Spravto and wants to continue it.   Continues meds Adderall XR 20 mg every morning, Wellbutrin XL 450 every morning, clonidine 0.2 mg twice daily, lorazepam 1 mg every 8 hours as needed.  Trazodone 50 mg tablets 1-2 nightly as needed insomnia Has started nortriptyline 75 mg nightly for about 3 weeks and DT level 176, reduced to 50 mg HS early November. pramipexole 0.5 mg twice daily off label for treatment resistant depression on 10/08/2022. She is better motivated than she was.  She is journaling 3 pages a day.  She has started walking and has walked 5 days a week for 50 minutes for the last 2 weeks.  That is significantly helped her mood.  Her mood tends to be better when she interacts with family.  However she still has some periods of depression.  She is tolerating the medications well.  She still feels like her affect  and confidence is not back to normal. Plan:  increase pramipexole augmentation off label to 0.5 mg tID or 0.75 mg BID   10/29/22 appt noted: Received Spravato 84 mg today as scheduled.  Tolerated it well without nausea or vomiting headache or chest pain or palpitations.  Expected dissociation gradually resolved over the 2 hour observation period. She feels 50% less depressed with Spravto and wants to  continue it.   Continues meds Adderall XR 20 mg every morning, Wellbutrin XL 450 every morning, clonidine 0.2 mg twice daily, lorazepam 1 mg every 8 hours as needed.  Trazodone 50 mg tablets 1-2 nightly as needed insomnia Has started nortriptyline 75 mg nightly for about 3 weeks and DT level 176, reduced to 50 mg HS early November. pramipexole increased to 0.75 mg twice daily off label for treatment resistant depression  She has been feeling somewhat better with the increase in pramipexole and is tolerating it well.  She still is easily overwhelmed.  Her affect and mood can improve now when around her family or doing something positive.  She has been able to be more productive. She is tolerating the current medications. She is still considering TMS as an alternative to Spravato.  11/15/2022 appointment noted: Received Spravato 84 mg today as scheduled.  Tolerated it well without nausea or vomiting headache or chest pain or palpitations.  Expected dissociation gradually resolved over the 2 hour observation period. She feels 50% less depressed with Spravto and wants to continue it.   Continues meds Adderall XR 20 mg every morning, Wellbutrin XL 450 every morning, clonidine 0.2 mg twice daily, lorazepam 1 mg every 8 hours as needed.  Trazodone 50 mg tablets 1-2 nightly as needed insomnia Has started nortriptyline 75 mg nightly for about 3 weeks and DT level 176, reduced to 50 mg HS early November. pramipexole increased to 0.75 mg twice daily off label for treatment resistant depression  He has noticed some increase in depression due to the length of time since the last Spravato administration.  She believes Spravato is helping her.  sHe is not suicidal but has felt very blue the last few days. She is tolerating the medication. She is ambivalent about Spravato versus [place] but she is considering [place]. She wants to continue Spravato administration because it is clearly helpful.  11/19/22 appt noted: Received  Spravato 84 mg today as scheduled.  Tolerated it well without nausea or vomiting headache or chest pain or palpitations.  Expected dissociation gradually resolved over the 2 hour observation period. She feels 50% less depressed with Spravto and wants to continue it.   Continues meds Adderall XR 20 mg every morning, Wellbutrin XL 450 every morning, clonidine 0.2 mg twice daily, lorazepam 1 mg every 8 hours as needed.  Trazodone 50 mg tablets 1-2 nightly as needed insomnia Has started nortriptyline 75 mg nightly for about 3 weeks and DT level 176, reduced to 50 mg HS early November. pramipexole increased to 0.75 mg twice daily off label for treatment resistant depression  She has felt better back on Spravato more regularly.  However she still has residual depression esp when alone or when wihtout activity.  Can function when needed.   Does not have her connfidence back. She plans to pursue [place] availability. Have discussed retrying [place]  11/28/22 appt noted: Received Spravato 84 mg today as scheduled.  Tolerated it well without nausea or vomiting headache or chest pain or palpitations.  Expected dissociation gradually resolved over the 2 hour observation period.  She feels 50% less depressed with Spravto and wants to continue it.   Continues meds Adderall XR 20 mg every morning, Wellbutrin XL 450 every morning, clonidine 0.2 mg twice daily, lorazepam 1 mg every 8 hours as needed.  Trazodone 50 mg tablets 1-2 nightly as needed insomnia Has started nortriptyline 75 mg nightly for about 3 weeks and DT level 176, reduced to 50 mg HS early November. pramipexole increased to 0.75 mg twice daily off label for treatment resistant depression  Last week she was more depressed than usual for reasons that are not clear.  She has not had a clear answer from Adrienne Mocha about [facility] options.  States that they have not returned her call.  She is asked about Auvelity retry in place of Wellbutrin.  Has still been  walking.  12/03/22 appt noted: Received Spravato 84 mg today as scheduled.  Tolerated it well without nausea or vomiting headache or chest pain or palpitations.  Expected dissociation gradually resolved over the 2 hour observation period. She feels 50% less depressed with Spravto and wants to continue it.   Continues meds Adderall XR 20 mg every morning, Wellbutrin XL 450 every morning, clonidine 0.2 mg twice daily, lorazepam 1 mg every 8 hours as needed.  Trazodone 50 mg tablets 1-2 nightly as needed insomnia started nortriptyline 75 mg nightly for about 3 weeks and DT level 176, reduced to 50 mg HS early November. pramipexole increased to 0.75 mg twice daily off label for treatment resistant depression  No SE .  Satisfied with meds. Depression is better in the last week.  Not sure why that is the case.  Still walking daily and that helps and journaling 3 pages daily.  Working on [employer], academic.  No changes desire.  Clearly benefits from Spravato but not 100%.  Still considering TMS.  12/10/22 appt noted: Received Spravato 84 mg today as scheduled.  Tolerated it well without nausea or vomiting headache or chest pain or palpitations.  Expected dissociation gradually resolved over the 2 hour observation period. She feels 50% less depressed with Spravto and wants to continue it.   Continues meds Adderall XR 20 mg every morning, Wellbutrin XL 450 every morning, clonidine 0.2 mg twice daily, lorazepam 1 mg every 8 hours as needed.  Trazodone 50 mg tablets 1-2 nightly as needed insomnia started nortriptyline 75 mg nightly for about 3 weeks and DT level 176, reduced to 50 mg HS early November. pramipexole increased to 0.75 mg twice daily off label for treatment resistant depression  No SE .   Depression was worse this week for no apparent reason.  She struggled being positive.  She has felt more discouraged.  She has felt more anxious.  She has had a hard time doing tasks.  She is interested in retrying the  [place] which we had discussed previously. Plan: She agrees to [facility].  To improve tolerability and reduce risk of side effects, Stop Wellbutrin and start Auvelity 1 in the morning for 1 week then 1 twice daily AndReduce pramipexole 0.5 mg BID and reduce nortriptyline to 50 mg HS  12/17/22 appt noted: Received Spravato 84 mg today as scheduled.  Tolerated it well without nausea or vomiting headache or chest pain or palpitations.  Expected dissociation gradually resolved over the 2 hour observation period. She feels 50% less depressed with Spravto and wants to continue it.   Continues meds Adderall XR 20 mg every morning, stopped Wellbutrin XL 150 every morning, clonidine 0.2 mg twice daily,  lorazepam 1 mg every 8 hours as needed.  Trazodone 50 mg tablets 1-2 nightly as needed insomnia Has started nortriptyline 75 mg nightly for about 3 weeks and DT level 176, reduced to 50 mg HS early November. pramipexole 0.375 mg twice daily off label for treatment resistant depression with plan to stop Started Auvelity 1 AM Still depressed to moderate degree.  Tolerating Auvelity so far.  No other problems with meds.  Willing to give Auvelity a chance. Plan: She agrees to [facility].  Continue 1 AM and when tolerated then increase to BID Stop pramipexole.  12/26/22 received Spravato  01/01/23 appt noted: Received Spravato 84 mg today as scheduled.  Tolerated it well without nausea or vomiting headache or chest pain or palpitations.  Expected dissociation gradually resolved over the 2 hour observation period. She feels 50% less depressed with Spravto and wants to continue it.   Continues meds Adderall XR 20 mg every morning, stopped Wellbutrin XL 150 every morning, clonidine 0.2 mg twice daily, lorazepam 1 mg every 8 hours as needed.  Trazodone 50 mg tablets 1-2 nightly as needed insomnia Has started nortriptyline 75 mg nightly for about 3 weeks and DT level 176, reduced to 50 mg HS early  November. Forgot to reduce pramipexole stoll taking  0.5 mg twice daily off label for treatment resistant depression  Started Auvelity 1 AM & PM last week. Occ misses Spravato DT htn.this week a better than last.   Painting again more and it helps mood.  Confidence still low and not as likely to socialize as normal but enjoys family.   Still ambivalent about [place]. Tolerating meds.  Including the increase in [place].    01/08/23 appt noted: Received Spravato 84 mg today as scheduled.  Tolerated it well without nausea or vomiting headache or chest pain or palpitations.  Expected dissociation gradually resolved over the 2 hour observation period. She feels 50% less depressed with Spravto and wants to continue it.   Continues meds Adderall XR 20 mg every morning, stopped Wellbutrin XL 150 every morning, clonidine 0.2 mg twice daily, lorazepam 1 mg every 8 hours as needed.  Trazodone 50 mg tablets 1-2 nightly as needed insomnia Has started nortriptyline 75 mg nightly for about 3 weeks and DT level 176, reduced to 50 mg HS early November. reduced pramipexole to 0.5 mg daily off label for treatment resistant depression  Started Auvelity 1 AM & PM mid Feb. More depressed as week progresses.  Thinks it is worse with less pramipexole.  More negative.  Able to function but feels miserable.  No SI.  Hopeless.  Sleep ok.  Tolerating meds.   Talked to [facility] about [place] and appt mid March. Plan: increase pramipexole to 0.5 mg TID for dep bc seemed to worsen with the reduction.  01/15/23 appt noted: Received Spravato 84 mg today as scheduled.  Tolerated it well without nausea or vomiting headache or chest pain or palpitations.  Expected dissociation gradually resolved over the 2 hour observation period. She feels 50% less depressed with Spravto and wants to continue it.   Continues meds Adderall XR 20 mg every morning, stopped Wellbutrin XL 150 every morning, clonidine 0.2 mg twice daily, lorazepam 1 mg every 8  hours as needed.  Trazodone 50 mg tablets 1-2 nightly as needed insomnia Has started nortriptyline 75 mg nightly for about 3 weeks and DT level 176, reduced to 50 mg HS early November. Increased pramipexole to 0.5 mg  to TID  off label for  treatment resistant depression  Started Auvelity 1 AM & PM mid Feb. The approval with and without the any of the glowing positive reviews and [employer] wanting to that is the difference between [employer] story in and 100 markedly better depression the increase in pramipexole.  She feels like her depression is almost resolved.  She is very pleased with the response.  She is tolerating the medications well  ECT-MADRS    Flowsheet Row Clinical Support from 01/08/2023 in [place] from 08/06/2022 in [place] from 07/04/2022 in [facility] from 05/21/2022 in [place] Office Visit from 03/02/2022 in [facility]  MADRS Total Score '21 29 15 27 '$ 46        Past Psychiatric Medication Trials: fluoxetine, duloxetine, Viibryd, Pristiq, sertraline, citalopram,  Trintellix anxious and SI Wellbutrin XL 450 Auvelity 1 dose nortriptyline 75 mg nightly for about 3 weeks and DT level 176, reduced to 50 mg HS NR. Pramipexole 1 mg NR Adderall, Adderall XR, Vyvanse, Ritalin, Strattera low dose NR Lorazepam Trazodone  Depakote,  lamotrigine cog complaints Lithium remotely Abilify 7.5  Vraylar 1.5 mg daily agitation and insomnia Rexulti insomnia Latuda 40 one dose, CO anxious and SI Seroquel XR 300 Olanzapine 10  At visit November 12, 2019. We discussed Patient developed an increasingly severe alcohol dependence problem since her last visit in January.  She went to [facility] and has had no alcohol since then except 1 day.  She never abused stimulants but they took her off  the stimulants at [facility].  Her ADD was markedly worse.  The Wellbutrin did not help the ADD.   D history lamotrigine rash at 67 yo  Review of Systems:  Review of Systems  Constitutional:  Positive for fatigue.  HENT:  Negative for congestion.   Cardiovascular:  Negative for palpitations.  Musculoskeletal:  Positive for back pain. Negative for arthralgias and joint swelling.       SP hip surgery October 2020  Neurological:  Negative for dizziness and tremors.  Psychiatric/Behavioral:  Positive for decreased concentration and dysphoric mood. Negative for agitation, behavioral problems, confusion, hallucinations, self-injury, sleep disturbance and suicidal ideas. The patient is nervous/anxious. The patient is not hyperactive.        Forgetful at times about med recommendations.  Send present for him The only [place] she notices a it states it is unbelievable him how how much are her 3 steps around freedom of the present fall and just in the past decade  Medications: I have reviewed the patient's current medications.  Current Outpatient Medications  Medication Sig Dispense Refill   amLODipine (NORVASC) 2.5 MG tablet Take 2.5 mg by mouth daily.     amphetamine-dextroamphetamine (ADDERALL XR) 20 MG 24 hr capsule Take 1 capsule (20 mg total) by mouth every morning. 30 capsule 0   amphetamine-dextroamphetamine (ADDERALL XR) 20 MG 24 hr capsule Take 1 capsule (20 mg total) by mouth every morning. 30 capsule 0   buPROPion (WELLBUTRIN XL) 150 MG 24 hr tablet TAKE 3 TABLETS BY MOUTH DAILY (Patient taking differently: Take 150 mg by mouth daily.) 270 tablet 1   cloNIDine (CATAPRES) 0.2 MG tablet Take 1 tablet (0.2 mg total) by mouth 2 (two) times daily. 60 tablet 2   Dextromethorphan-buPROPion ER (AUVELITY) 45-105 MG TBCR Take 1 tablet by mouth 2 (two) times daily. 60 tablet 1   iron polysaccharides (  NIFEREX) 150 MG capsule TAKE 1 CAPSULE BY MOUTH EVERY DAY 90 capsule 1   LORazepam (ATIVAN) 1  MG tablet Take 1 tablet (1 mg total) by mouth every 8 (eight) hours as needed. for anxiety 90 tablet 0   losartan (COZAAR) 50 MG tablet Take 50 mg by mouth daily.     nebivolol (BYSTOLIC) 2.5 MG tablet Take 2.5 mg by mouth daily.     nortriptyline (PAMELOR) 25 MG capsule Take 2 capsules (50 mg total) by mouth at bedtime. 180 capsule 0   pramipexole (MIRAPEX) 0.5 MG tablet Take 1 tablet (0.5 mg total) by mouth 3 (three) times daily. 90 tablet 0   SPRAVATO, 84 MG DOSE, 28 MG/DEVICE SOPK USE 3 SPRAYS IN EACH NOSTRIL ONCE A WEEK 3 each 5   traZODone (DESYREL) 50 MG tablet TAKE 1-2 TABLETS BY MOUTH NIGHTLY AS NEEDED FOR SLEEP 180 tablet 1   No current facility-administered medications for this visit.    Medication Side Effects: None  Allergies:  Allergies  Allergen Reactions   Metronidazole Shortness Of Breath and Other (See Comments)    Heart pounding   Ferrlecit [Na Ferric Gluc Cplx In Sucrose] Other (See Comments)    Infusion reaction 05/12/2019    Past Medical History:  Diagnosis Date   ADHD    Anemia    Anxiety    Arthritis    Depression    Heart murmur    i went to see a cardiologit slast eyar  and i had zero plaque,    PONV (postoperative nausea and vomiting)    Recovering alcoholic in remission ([place])     Family History  Problem Relation Age of Onset   Atrial fibrillation Mother    CAD Father     Past Medical History, Surgical history, Social history, and Family history were reviewed and updated as appropriate.   Please see review of systems for further details on the patient's review from today.   Objective:   Physical Exam:  There were no vitals taken for this visit.  Physical Exam Constitutional:      General: She is not in acute distress. Neurological:     Mental Status: She is alert and oriented to person, place, and time.     Coordination: Coordination normal.     Gait: Gait normal.  Psychiatric:        Attention and Perception: Attention and  perception normal.        Mood and Affect: Mood is anxious. Mood is not depressed. Affect is not labile, angry or tearful.        Speech: Speech is not rapid and pressured, slurred or tangential.        Behavior: Behavior is not slowed.        Thought Content: Thought content is not paranoid or delusional. Thought content does not include homicidal or suicidal ideation. Thought content does not include suicidal plan.        Cognition and Memory: Cognition normal. Memory is not impaired. She does not exhibit impaired recent memory.        Judgment: Judgment normal.     Comments: Insight intact. No auditory or visual hallucinations. No delusions.  Depression resolved this week     Lab Review:     Component Value Date/Time   NA 137 01/12/2021 1430   NA 140 11/18/2018 1544   K 3.8 01/12/2021 1430   CL 108 01/12/2021 1430   CO2 22 01/12/2021 1430   GLUCOSE 94 01/12/2021 1430  BUN 14 01/12/2021 1430   BUN 20 11/18/2018 1544   CREATININE 0.82 01/12/2021 1430   CALCIUM 8.9 01/12/2021 1430   PROT 6.6 01/12/2021 1430   ALBUMIN 3.9 01/12/2021 1430   AST 12 (L) 01/12/2021 1430   ALT 11 01/12/2021 1430   ALKPHOS 46 01/12/2021 1430   BILITOT 0.5 01/12/2021 1430   GFRNONAA >60 01/12/2021 1430   GFRAA >60 09/02/2019 0249   GFRAA >60 01/27/2019 0811       Component Value Date/Time   WBC 4.5 01/12/2021 1430   RBC 4.32 01/12/2021 1430   HGB 12.8 01/12/2021 1430   HGB 12.9 07/17/2019 0953   HCT 38.5 01/12/2021 1430   HCT 21.9 (L) 12/25/2018 1221   PLT 272 01/12/2021 1430   PLT 286 07/17/2019 0953   MCV 89.1 01/12/2021 1430   MCH 29.6 01/12/2021 1430   MCHC 33.2 01/12/2021 1430   RDW 12.4 01/12/2021 1430   LYMPHSABS 1.4 01/12/2021 1430   MONOABS 0.4 01/12/2021 1430   EOSABS 0.0 01/12/2021 1430   BASOSABS 0.0 01/12/2021 1430    No results found for: "POCLITH", "LITHIUM"   No results found for: "PHENYTOIN", "PHENOBARB", "VALPROATE", "CBMZ"   .res Assessment: Plan:     Recurrent major depression resistant to treatment ([place]) - Plan: Dextromethorphan-buPROPion ER (AUVELITY) 45-105 MG TBCR  Generalized anxiety disorder  Attention deficit hyperactivity disorder (ADHD), predominantly inattentive type  Insomnia due to mental condition  Accelerated hypertension   She has treatment resistant major depression ongoing with 50% better with Spravato. Have  discussed some of her  abnormal behaviors last year leading to this depressive episode getting worse which she says were associated with heavy use of delta 8 and not a manic episode.  She realizes now that that was not good for her.  She stopped all use of other drugs including those available over-the-counter such as delta 8 or any other THC related products.  She is no longer having any of those types of behaviors and instead is depressed.  She is experiencing more pleasure and is less blunted and enjoying more and better inteest versus before the Spravato.  She also feels worse if misses a dose of Spravato.  Much better with pramipexole 0.5 mg TID  Patient was administered Spravato 84 mg intranasally today.  The patient experienced the typical dissociation which gradually resolved over the 2-hour period of observation.  There were no complications.  Specifically the patient did not have nausea or vomiting or headache.  Blood pressures monitored at the 40-minute and 2-hour follow-up intervals.  Borderline high.  By the time the 2-hour observation period was met the patient was alert and oriented and able to exit without assistance.  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  See nursing note for further details.She wants to continue Spravato. We discussed discussed the side effects in detail as well as the protocol required to receive Spravato.   Failed multiple antidepressants.  Many of them were not actual failures but  intolerances and it is unclear whether some of that was more connected with anxiety than true side effects.  1 example is the [place].  In general she does not want to try anything but an antidepressant but has failed all major categories of antidepressants except TCAs and MAO inhibitors which have not been tried until now.  Started nortriptyline 75 mg nightly.  Serum level 176.  So Reduced to 50 mg HS  Discussed side effects in detail.  Needs more time to help. Extensive discussion previously about her ambivalence about meds and missatributing sx of depression as SE of meds.    She is tolerating Auvelity.  Continue 1  BID for at least 2 more weeks.  Worse with less pramipexole. Increased to 0.5 mg TID on about end of Feb 2024 and markedly better Dosing range off label for depression ranges from 1-5 mg daily.  Disc risk compulsive impulsive behavior and manis.    Started Spravato 84 mg twice weekly on 03/16/2022.  Now on weekly administration  Adderall  XR 20 mg AM   Ativan 1 mg 3 times daily as needed anxiety but try to cut it back. Is not ideal to use benzodiazepine with stimulant but because of the severity of her symptoms it has been necessary.  Hope to eventually eliminate the benzodiazepine.  Expected as her depression improves her anxiety will improve as well.  However lately her anxiety has been unmanageable.  We will expect that to improve as the depression improves.  She has headed insturctions to reduce this.  Continue clonidine 0.2 mg BID off label for anxiety and helps BP partially. BP is better controlled but not consistent.  Consider increasing amlodipine.  Also on losartan 50  Discussed potential benefits, risks, and side effects of stimulants with patient to include increased heart rate, palpitations, insomnia, increased anxiety, increased irritability, or decreased appetite.  Instructed patient to contact office if experiencing any significant tolerability issues. She wants to  return to usual dose of Adderall for ADD bc of mor poor cognitive function with reduction.  Also discussed that depression will impair cognitive function.  [facility] consultation was initially denied .  She says she called [facility] about this but they have not scheduled an appt .Marland Kitchen   Consider consultation with [facility] if Id[facility] Crouch is not an option.  We discussed the pros and cons of this versus Spravato.  For now she wants to continue Spravato because it is clearly helpful.  Extensive discussion about her chronic ambivalence about psychiatric medicines and tendency to blame medicine for the symptoms of depression that she had prior to even starting the medications.  After discussing this at length she was able to acknowledge that that is factual.  She does not appear to be having any significant side effects with the current medications.  However so far not much benefit with nortriptyline despite adequate level.  Disc risk pllypharmacy  Rec keep track of BP and discuss with PCP  Has Maintained sobriety  FU with Spravato weekly   Lynder Parents, MD, DFAPA     Please see After Visit Summary for patient specific instructions.  No future appointments.                 No orders of the defined types were placed in this encounter.      -------------------------------

## 2023-01-22 ENCOUNTER — Ambulatory Visit: Payer: 59

## 2023-01-22 ENCOUNTER — Ambulatory Visit (INDEPENDENT_AMBULATORY_CARE_PROVIDER_SITE_OTHER): Payer: 59 | Admitting: Psychiatry

## 2023-01-22 VITALS — BP 97/55 | HR 75

## 2023-01-22 DIAGNOSIS — F5105 Insomnia due to other mental disorder: Secondary | ICD-10-CM | POA: Diagnosis not present

## 2023-01-22 DIAGNOSIS — F900 Attention-deficit hyperactivity disorder, predominantly inattentive type: Secondary | ICD-10-CM

## 2023-01-22 DIAGNOSIS — F411 Generalized anxiety disorder: Secondary | ICD-10-CM

## 2023-01-22 DIAGNOSIS — F339 Major depressive disorder, recurrent, unspecified: Secondary | ICD-10-CM

## 2023-01-22 DIAGNOSIS — I10 Essential (primary) hypertension: Secondary | ICD-10-CM

## 2023-01-23 ENCOUNTER — Encounter: Payer: Self-pay | Admitting: Psychiatry

## 2023-01-23 ENCOUNTER — Other Ambulatory Visit: Payer: Self-pay

## 2023-01-23 MED ORDER — SPRAVATO (84 MG DOSE) 28 MG/DEVICE NA SOPK: PACK | NASAL | 1 refills | Status: DC

## 2023-01-23 NOTE — Progress Notes (Addendum)
Laura Chang [id number] 05-Oct-1956 67 y.o.  Subjective:   Patient ID:  Laura Chang is a 67 y.o. (DOB 09-01-1956) female.  Chief Complaint:  Chief Complaint  Patient presents with   Follow-up   Depression   Anxiety   ADD      Laura Chang presents to the office today for follow-up of depression and anxiety and ADD.  seen November 12, 2019.  Melted down in 2020.  Went to [employer] in July.  No withdrawal.  1 drink since then.  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 She was on no stimulant and no SSRI but was taking Strattera and Wellbutrin.  The following changes were made. Stop Strattera. OK restart stimulant bc severe ADD Restart Adderall 1 daily for a few days and if tolerated then restart 1 twice daily. If not tolerated reduce the dosage if needed. May need to stop Wellbutrin if not tolerating the stimulant.  Yes.  DC Wellbutrin Restart Prozac 20 mg daily.  February 2021 appointment with the following noted: Completed grant proposal.  Couldn't doit without Adderall.  Sold a bunch of work.   Adderall XR lasts about 3 pm.  Strength seems about right.  BP been OK.  Not jittery.   Stopped Wellbutrin but had no SE. Mood drastically better with grant proposal and back on fluoxetine.  Less depressed and lethargic.  No anxiety.  Cut back on coffee. Started back with devotions and stronger faith. Plan: Continue Prozac 20 mg daily. May have to increase the dose at some point in the future given that she usually was taking higher dosages but she is getting good response at this time. Restart Wellbutrin off label for ADD since can't get 2 ADDERALL daily. 150 mg daily then 300 mg daily. She can adjust the dose between 150 mg and 300 mg daily to get the optimal effect.   05/11/2020 appointment with the following noted: Has been inconsistent with Prozac and Wellbutrin. Not sure of the effect of Wellbutrin. Biggest deterrent in work is anxiety.   Some of the work is conceptual and difficult at times.  Can feel she's not up to a project at times.  Overall is OK but would like a steadier benefit from stimulant.  Exhausted from managing concentration and keeping up with things from the day.  Loses things.  Not good keeping up with schedule. Overall productive and emotionally OK. Can feel Adderall wear off. Mood is better in summer and worse in the winter.   F died in 10-15-2023 and that is a loss. No SE Wellbutrin. Still attends AA meetings.  Real benefit from [facility] last year. Recognizes effect of anemia on ADD and mood.  Had iron infusions last winter. Plan:  Wellbutrin off label for ADD since can't get 2 ADDERALL daily. 150 mg daily then 300 mg daily.  01/24/2021 appointment with following noted: Doing a program called [employer] mindfulness app since Xmas.  CBT app helped the depression.  App helped her focus better.  Lost sign weight. Writing a lot. Before Xmas felt depressed and started negative thinking worse, self denigrating. Not drinking. More isolated.   Recognizes mo is narcissist.    Didn't tell anyone she was born until 3 mos later.  M aloof and uninterested in pt.  Lied about her birthday.  Mo lack of affection even with pt's kids. Going to [place] for a year and it helped her to quit drinking. Also misses kids being gone  with a hole also. Plan: No med changes  05/04/2021 appointment with the following noted: Therapist [name] thinks she's manic. Lost weight to 144#.   States she is still sleeping okay.  Admits she is hyper and recognizes that she is likely manic.  She feels great, euphoric with an increased sense of spiritual connectedness to God.  She has racing thoughts and talks fast and talks a lot and this is noted by her husband.  He thinks she is a bit hyper.  She has been able to maintain sobriety although she will have 1 glass of wine on special occasions but does not drink by herself.  She is not drinking to  excess.  She denies any dangerous impulsivity.  She is clearly not depressed and not particularly anxious.  She has no concerns about her medication and she has been compliant.  06/16/21 appt noted: So much better.  Going through a lot but the manic thing happened on top of it.  So much slower.  Didn't feel like losing anything with risperidone.  Likes the Adderalll at 10 mg. Some drowsiness in the AM and very drowsy from risperidone 2 mg HS. Prayer life is better. Handling stress better. Less depressed with risperidone. Still likes trazodone. Sleeps well. Plan: Reduce Prozac to 10 mg daily.  Consider stopping it because it can feel the mania however she is reluctant to do that because she fears relapse of depression. Reduce risperidone to 1.5 mg nightly due to side effects.  Discussed risk of worsening mania.  07/25/2021 appointment with the following noted: Misses the Adderall and hard to function without it. Depressed now. Heavy chest.  Anxious and guilty.  Body feels heavy.   Hates Wellbutrin.   Plan: Increase fluoxetine to 20 mg daily Add Abilify 1/2 of 15 mg tablet daily Wean wellbutrin by 1 tablet each week  bc she feels it is not helpful and DT polypharmacy Reduce risperidone to 1 daily for 1 week and stop it. Disc risk of mania. Increase Adderall to XR 20 mg AM  08/08/21 Much less depressed and starting to feel normal I feel a lot better. No SE.  Speech normal off risperidone. Sleeping OK on trazaodone and enough.   Noticed benefit from Adderall again. Plan: continue fluoxetine to 20 mg daily Continue Abilify 1/2 of 15 mg tablet daily for depression and mania continue Adderall to XR 20 mg AM  10/10/2021 phone call: Pt stated she feels like the Abilify should be decreased to 23m.She said she is depressed but rational and not suicidal.She has an appt Monday and can wait until then if you prefer. MD response: Reduce the Abilify to 7.5 mg every other day.  We will meet on 10/16/2021  and decide what to do from there.  10/16/2021 appointment with the following noted: More depressed.  Most depressed I've ever been.  Just numb.  Sense of grief.   Thinks the manic episode was unlike anything else she ever had.  Doesn't want to medicate against it.  Don't enjoy people.  Easily overwhelmed.  Had some death thoughts but not suicidal.  Has been functional.  Feels better today after reducing Abilify to every other day but she is only been doing that for 3 days. A/P: Episode of post manic depression was explained. continue fluoxetine to 20 mg daily Hold Abilify for 1 week then resume Abilify 1/2 of 15 mg tablet every other day for depression and mania continue Adderall to XR 20 mg AM  10/27/2021  appointment with the following noted: I'm doing so much better.  Handling the depression better. Better self talk and spiritual focus has helped.   Dep 6/10 manifesting as anxiety with low confidence.   F died 2  years ago and M 67 yo and is dependent . She is working hard to feel better but still feels depressed.  She almost feels like she has a little more anxiety since restarting Abilify every other day. Plan: continue fluoxetine to 20 mg daily DC Abilify .  Vrayalar 1.5 mg QOD to try to get rid of depression ASAP. continue Adderall to XR 20 mg AM  11/10/2021 appointment with the following noted: Busy with Xmas and it was fun with family but then a big let down.  Did well with it.  Functioned well with it.  Working hard on things with depression.  Not shutting down. Not sure but feels better today but yesterday was hard.  Difficulty dealing with mother.  She won't do anything to help herself.  Yesterday with her all day.  Won't do PT and has isolated herself.    Lack of confidence.   No SE with Vraylar.  11/24/21 urgent appointment appt noted: More and more depressed.   So anxious and doesn't want to be alone but can do so. No appetite. Hurts inside. Has had some fleeting suicidal  thoughts but would not act on them.  Tolerating meds. Has been consistent with Vraylar 1.5 mg every other day, fluoxetine 20 mg daily Plan: Increase Vraylar to 1.5 mg daily Change Prozac to Trintellix 10 mg daily. Discussed side effects of each continue Adderall to XR 20 mg AM  12/27/2021 appointment with the following noted: Not OK.  I feel less depressed but feels bat shit. Not sleeping well.  Extremely anxious. Off and on sleep. 3-4 hours of sleep.   Still having daily SI.  But also become obvious has so much to do.  Overwhelmed by tasks.   Needs anxiety meds to just function. Not more motivated.  Walked yesterday.   Feels afraid like in trouble but not irritable or angry. DC DT agitation Vraylar to 1.5 mg daily Change Prozac to Trintellix 10 mg daily. Hold Adderall to XR 20 mg AM Clonidine 0.1 1/2 tablet twice daily for 2 days and if needed for anxiety and sleep increase to 1 twice daily Ok temporary Ativan 1 mg 3 times daily as needed anxiety  01/05/22 appt noted: Off fluoxetine and  Trintellix.  Only on Ativan, trazodone and Adderall XR 20 plus added clonidine 0.1 mg BID Didn't think she needed to start Trintellix. Not taking Ativan.   Didn't like herself last week. Feels some better today. Wonders if the manic sx Not agitated.  Anxiety kind of calmed down.  A lot to be anxious about situationally.  $ stress. Concerns about downers with meds. Can't access normal personality. ? Lethargy and inability to talk as sE. Plan: Latuda 20-40 mg daily with food. Adderall to XR 20 mg AM Clonidine 0.1 1/2 tablet twice daily  reduce dose to be sure no SE Ok temporary Ativan 1 mg 3 times daily as needed anxiety  01/19/22 appt noted: Taking Latuda 20 mg daily.  Took 40 mg once and felt anxious and  SI Still depressed and not very reactive Anxiety mainly about the depression and fears of the future. She wants to revisit manic sx and thinks it was maybe bc taking delta 8 bc was taking a lot  of it so still  doesn't think she's classic bipolar. She wants to only take Prozac bc thinks Latuda is perpetuating depression. Says the delta 8 was very psychaedelic.  When not taking it was not manic.  Sleeping ok again.  Plan: Per her request DC Latuda 20-40 mg daily with food. She wants to continue Prozac alone AMA  Adderall to XR 20 mg AM Clonidine 0.1 1/2 tablet twice daily  reduce dose to be sure no SE Ok temporary Ativan 1 mg 3 times daily as needed anxiety  01/23/2022 phone call complaining of increased anxiety since stopping Latuda.  She will try increasing clonidine.  01/26/2022 phone call not feeling well and wanted to restart the Vraylar.  However notes indicate that had made her agitated therefore she was encouraged to pick up samples of Rexulti 1 mg and start that instead.  02/06/2022 phone call: Stating she felt the Rexulti was helping with depression but she was not sleeping well and obsessing over things.  She was encouraged to increase Rexulti to 2 mg daily and increase trazodone for sleep.  02/09/2022 appointment with the following noted: This was an urgent work in appointment No sleep last night with trazodone 100 mg HS Nothing really better depression or anxiety. Ruminating negative anxious thoughts. Did not tolerate Rexulti because it was causing insomnia.  Does not think it helped depression.  Lacks emotion that she should have.  Lacks her usual personality.  Some hopeless thoughts.  Some death thoughts.  Some suicidal thoughts without plan or intent Plan: DC Rexulti and Prozac & DC trazodone Adderall to XR 20 mg AM Clonidine 0.1 1/tablet twice daily  reduce dose to be sure no SE Ok temporary Ativan 1 mg 3 times daily as needed anxiety Start Seroquel XR 150 mg nightly  03/02/2022 appointment: Langley Gauss called back a few days after starting Seroquel stating it was making her more anxious and more depressed.  This seemed unlikely as this medicine rarely ever causes anxiety.   She stopped the medication waited 3 days and called back still had anxiety and depression but thought perhaps the anxiety was a little better.  She did not want to take the Seroquel. She knew about the option of Spravato and wanted to pursue that. Now questions whether to return to Seroquel while waiting to start Spravato bc feels just as bad without it and knows she didn't give it enough time to work.   MADRS 46  ECT-MADRS    Flowsheet Row Clinical Support from 01/08/2023 in [place] from 08/06/2022 in [place] from 07/04/2022 in [place] from 05/21/2022 in [place] Office Visit from 03/02/2022 in [facility]  MADRS Total Score '21 29 15 27 '$ 46      03/14/22 appt noted: Pt received Spravato 56 mg first dose today with some dissociative sx which were not severe.  She was anxious prior to the administration and felt better after receiving lorazepam 1 mg.  No NV, or HA. Wants to continue Spravato. Ongoing depression and desperate to feel better.  I'm not myself DT deprsssion which is most severe in recent history.  Anhedonia.  Low motivation.  Social avoidance. Continues to think all recent med trials are making her worse.  Sleep ok with Seroquel.  03/16/22 appt noted: Received Spravato 84 mg for the first time.  some dissociative sx which were not severe.  She was anxious prior to  the administration and felt better after receiving lorazepam 1 mg.  No NV, or HA. Wants to continue Spravato.   Does not feel any better or different since the last appt.  Ongoing depression.  Ongoing depression and desperate to feel better.  I'm not myself DT deprsssion which is most severe in recent history.  Anhedonia.  Low motivation.  Social avoidance. Continues to think all recent med trials are making her worse.  Sleep  ok with Seroquel.  Does not want to continue Seroquel for TRD.  03/20/2022 appointment noted: Came for Spravato administration today.  However blood pressure was significantly elevated approximately 180/115.  She was given lorazepam 1 mg and clonidine 0.2 mg to try to get it down. She states she regretted stopping the Seroquel XR 300 mg tablets.  She now realizes it was helpful.  She did not sleep much at all last night.  She did not take the Adderall this morning. 2 to 3 hours after arrival blood pressure was still elevated at  170/110, 62 pulse.  For Spravato administration was canceled for today.  She admits to being anxious and depressed.  She is not suicidal.  She is highly motivated to receive the Spravato.  We discussed getting it tomorrow.  03/22/2022 appointment noted: Patient's blood pressure was never stable enough yesterday in order to get her in for Spravato administration.  She was encouraged to see her primary care doctor.  It is better today.  03/26/2022 appointment with the following noted: Blood pressure was better.  Saw her primary care doctor who started on oral Bystolic 2.5 mg daily. Received Spravato 84 mg today as scheduled.  Tolerated it well without nausea or vomiting headache or chest pain or palpitations.  Her blood pressure was borderline but manageable. She remains depressed and anxious.  She is ambivalent about the medicine and desperate to get to feel better.  Continues to have anhedonia and low energy and low motivation and reduced ability to do things.  Less social.  Not suicidal.  03/28/22 appt noted: Received Spravato 84 mg today as scheduled.  Tolerated it well without nausea or vomiting headache or chest pain or palpitations.  Her blood pressure was borderline but manageable. Has not seen any improvement so far.  Tolerating Seroquel.  Inconsistent with Bystolic and BP has been borderline high. Still depressed and anxious and anhedonia.  Low motivation, energy,  productivity. Taking quetiapine and tolerating XR 300 mg nightly.  04/04/22 appt noted: Received Spravato 84 mg today as scheduled.  Tolerated it well without nausea or vomiting headache or chest pain or palpitations.  Her blood pressure was borderline but manageable. Has not seen any improvement so far.  Tolerating Seroquel.   She still tends to think that the medications are making her worse.  She has said this about each of the recent psychiatric medicines including Seroquel.  However her husband thinks she is improved.  She also admits there is some improvement in productivity.  She still feels highly anxious.  She still does not enjoy things as normal.  She still feels desperate to improve as soon as possible. Has been taking Seroquel XR since 03/20/2022  04/10/22 appt noted: Received Spravato 84 mg today as scheduled.  Tolerated it well without nausea or vomiting headache or chest pain or palpitations.  Her blood pressure was borderline but manageable. Has not seen any improvement so far.  Tolerating Seroquel.  Doesn't like Seroquel bc she thinks it flattens here. 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  Tolerated it well without nausea or vomiting headache or chest pain or palpitations.  Her blood pressure was borderline but manageable. Has not seen any improvement so far.  Tolerating Seroquel.  Doesn't like Seroquel bc she thinks it flattens her. Received Spravato 84 mg today as scheduled.  Tolerated it well without nausea or vomiting headache or chest pain or palpitations.  Her blood pressure was borderline but manageable. Has not seen any improvement so far.  Tolerating Seroquel.  Doesn't like Seroquel bc she thinks it flattens here.  We discussed her ambivalence about it. She is starting Auvelity and has tolerated it  the last 2 days without side effect.  She still does not feel like herself and feels flat and not enjoying things with suppressed expressed emotion  04/17/2022 appointment with the following noted: Received Spravato 84 mg today as scheduled.  Tolerated it well without nausea or vomiting headache or chest pain or palpitations.  Her blood pressure was borderline but manageable. Has not seen any improvement so far.  Tolerating Seroquel.  Doesn't like Seroquel bc she thinks it flattens her. She has been tolerating the Auvelity 1 in the morning without side effects for about a week.  She has not noticed significant improvement so far.  She still feels depressed and flat and not herself.  Other people notice that she is flat emotionally.  She is not suicidal.  She does feel discouraged that she is not getting better yet.  04/19/2022 appointment noted: Has increased Auvelity to 1 twice daily for 2 days, continues quetiapine XR 300 mg nightly, clonidine 0.3 mg twice daily, lorazepam 1 mg twice daily for anxiety and Adderall XR 20 mg in the morning. No obious SE but she still thinks quetiapine XR is making her feel down.  But not sedated Received Spravato 84 mg today as scheduled.  Tolerated it well without nausea or vomiting headache or chest pain or palpitations.  Her blood pressure was borderline but manageable. She still feels quite anxious and feels it necessary to take both the clonidine and lorazepam twice a day to manage her anxiety.  She has been consistently down and flat and not herself until yesterday afternoon she noted an improvement in mood and feeling much more like herself with her normal personality reemerging.  She was quite depressed in the morning with very dark negative thoughts.  She did not have those dark negative thoughts this morning.  She had a lot of questions about medication and when she was expecting to be improved and why she has not shown improvement up to now.  04/23/22 appt  noted: Has increased Auvelity to 1 twice daily for 1 week, continues quetiapine XR 300 mg nightly, clonidine 0.3 mg twice daily, lorazepam 1 mg twice daily for anxiety and Adderall XR 20 mg in the morning. No obious SE but she still thinks quetiapine XR is making her feel down.  But not sedated Received Spravato 84 mg today as scheduled.  Tolerated it well without nausea or vomiting headache or chest pain or palpitations.  She is still depressed but admits better function and is able to enjoy social interactions. Tolerating meds.  Would like to feel better for sure. Not herself.  Flat. Plan increase Auvelity to 1 tab BID as planned and reduce Quetiapine to 1/2 of ER 300 mg  bc NR for depression.  04/25/2022 appointment with the following noted: clonidine 0.3 mg twice daily, lorazepam 1 mg twice daily for anxiety and Adderall XR 20 mg in the morning. Seroquel XR 300 HS No obious SE but she still thinks quetiapine XR is making her feel down.  But not sedated Received Spravato 84 mg today as scheduled.  Tolerated it well without nausea or vomiting headache or chest pain or palpitations.  Called yesterday with more anxiety.  Had increased Auvelity for 1 day and reduced Seroquel XR for 1 day.  Felt restless and fearful  05/01/2022 appointment noted: clonidine 0.3 mg twice daily, lorazepam 1 mg twice daily for anxiety and Adderall XR 20 mg in the morning. Seroquel XR 150 HS, Auvelity 1 BID Received Spravato 84 mg today as scheduled.  Tolerated it well without nausea or vomiting headache or chest pain or palpitations.  Nurse has noted patient has called multiple times sometimes asking the same question repeatedly.  It is unclear whether she is truly forgetful or is just anxious seeking reassurance. Patient acknowledges ongoing depression as well as some anxiety but states she has felt a little better in the last couple of days.  She has reduced the Seroquel to 150 mg at night and has increased Auvelity to 1  twice daily but only for 1 day.  So far she seems to be tolerating it.  05/03/22 appt noted: clonidine 0.2 mg twice daily, lorazepam 1 mg twice daily for anxiety and Adderall XR 20 mg in the morning. Seroquel XR 150 HS, Auvelity 1 BID BP high this am about 170/100 and received extra clonidine 0.2 mg and came to receive Spravato.  Not dizzy, no SOB, nor CP but BP is still high Could not receive Spravato today bc BP high and pulse low at 30 ppm. Still depressed and anxious. Plan: continue trial Auvelity with Spravato She needs to get BP and pulse managed  05/08/22 TC: RTC  H Michael NA and mailbox full.  Could not leave message.  Pt  -  talked to she and H on speaker. H worried over wife.  Vacant stare.  Slurs words at times.  Not smiling. Reduced enjoyment.  Depression.  Withdrawn from usual activities.  Some irritability.  Anxious. Disc her concerns meds are making her worse.  Extensive discussion about her treatment resistant status.  There is a consistent pattern of not taking the medicines long enough to get benefit because she believes the meds are making her worse.  However the symptoms she describes as side effects are exactly the same symptoms that she had prior to taking the medication RX for  the depression.  So it is not clear that these are actual side effects. This is true about the 2 most recent meds including Seroquel and Auvelity.  Recommend psychiatric consultation in hopes of improving her comfort level with taking prescribed medications for a sufficient length of time to provide benefit. Extensive discussion about ECT is the treatment of choice for treatment resistant depression.  Spravato may work if she can comply with consistency.  There are medication options but they take longer to work.   Plan:  Reduce clonidine to 0.1 mg BID DT bradycardia.  Talk with PCP about BP and low pulse problems which are interfering with her consistent compliance with Spravato.   Limit lorazepam to  3 -4  mg daily max. Excess use is the cause of slurring speech.  She must stop excess use or will have to stop the med. Stop Auvelity per  her request.  But she has only been on the full dose for a little over a week and clearly has not had time to get benefit from it.  She thinks maybe it is making her more anxious. Reduce Seroquel from 150XR to 50 -100 mg at night IR.  She couldn't sleep when stopped it completely. Will not start new antidepressant until her SE issues are resolved or not. Get second psych opinion from Yehuda Budd MD or another psychiatrist.  H's sister is therapist in [place] [facility], MD, [facility]  05/16/2022 appointment with the following noted: Received Spravato 84 mg today as scheduled.  Tolerated it well without nausea or vomiting headache or chest pain or palpitations.  She stopped Auvelity as discussed last week. On her own, without physician input, she restarted Wellbutrin XL 450 mg every morning today.  She had taken it in the past.  She feels jittery and anxious. She feels less depressed than she did last week.  But she is still depressed without her usual range of affect.  She still is less social and less motivated than normal. Her primary care doctor increased the dose of losartan Plan: Stop Seroquel Reduce Wellbutrin XL to 300 mg every morning.  Starting the dose at 450 every morning is likely causing side effects of jitteriness and it should not be started at that have a dosage. Recommend she not change meds on her own without MDM put  05/23/2022 appointment with the following noted: Received Spravato 84 mg today as scheduled.  Tolerated it well without nausea or vomiting headache or chest pain or palpitations.  Has not dropped seroquel XR 300 mg 1/2 tablet nightly bc couldn't sleep without it. Has not tried lower dose quetiapine 50 mg HS Still feels depressed.   BP is better managed so far, just saw PCP.  BP is better today and infact is low today. Dropped  clonidine as directed from 0.3 mg BID bc inadequate control of BP to 0.2 mg BID.  However she wants to increase it back to 0.3 mg twice daily because she feels it helped her anxiety better.  Wonders about increasing Wellbutrin for depression.  However she has only been on 300 mg a day for a week.  She was on 450 mg daily in the past.  06/06/22 appt noted: Received Spravato 84 mg today as scheduled.  Tolerated it well without nausea or vomiting headache or chest pain or palpitations.  She is still depressed and anxious.  She wants to try to stop the Seroquel but cannot sleep without some of it.  She is taking lorazepam 1 mg 4 times daily and still having a lot of anxiety.  She wants to increase clonidine back to 0.3 mg twice daily.  She hopes for more improvement She recently went for a second psychiatric opinion as suggested the results of that are pending.  06/11/22 appt noted: Received Spravato 84 mg today as scheduled.  Tolerated it well without nausea or vomiting headache or chest pain or palpitations.  She is still depressed and anxious. Without much change.  Still hopeless, anhedonia, reduced inteterest and motivation.  Tolerating meds. Disc concerns Spravato is not hleping much. Plan: stop Seroquel and start olanzapine 10 mg HS for TRD and anxiety.  06/13/2022 appointment noted: Received Spravato 84 mg today as scheduled.  Tolerated it well without nausea or vomiting headache or chest pain or palpitations.  She is still depressed and anxious. Without much change.  Still hopeless, anhedonia, reduced inteterest  and motivation.  Tolerating meds. Disc concerns Spravato is not helping much as hoped but is improving a bit in the last week. Tolerating meds. Continues Wellbutrin XL 450 AM, tolerating recently started olanzapine  10 mg HS. Sleep is good.   Pending appt with [place] consult.  06/18/22 appt noted: Received Spravato 84 mg today as scheduled.  Tolerated it well without nausea or vomiting  headache or chest pain or palpitations.  Tolerating meds. Continues Wellbutrin XL 450 AM, tolerating recently started olanzapine  10 mg HS. Continues Adderall XR 20 amd and has tried to reduce lorazepam to '1mg'$  TID Sleep is good.   Pending appt with [place] consult. Depression is a little bit better in the last week with a little improvement in emotional expression and interest.  She is pushing herself to be more active.  Her daughter thought she was a little better than she has been.  However she is still depressed and still not her normal self with anhedonia and reduced emotional expressiveness.  06/20/22 appt noted: Received Spravato 84 mg today as scheduled.  Tolerated it well without nausea or vomiting headache or chest pain or palpitations.  Tolerating meds with a little sleepiness. Continues Wellbutrin XL 450 AM, tolerating recently started olanzapine  10 mg HS. Continues Adderall XR 20 amd and has tried to reduce lorazepam to '1mg'$  TID Sleep is good.   Mood is improving.  Better funciton.  Anxiety is better with olanzapine. Still not herself and depression not gone with some anhedonia and social avoidance and feeling overwhelmed.  8/14 2023 received Spravato 84 mg 06/27/2022 received Spravato Spravato 84 mg 07/02/2022 received Spravato 84 mg 07/04/2022 received Spravato 84 mg  07/09/2022 appointment noted: Received Spravato 84 mg today as scheduled.  Tolerated it well without nausea or vomiting headache or chest pain or palpitations.  Expected dissociation and feels less depressed with resolution of negative emotions immediately after Spravato and then depression, anxiety creep back in. Continues meds Adderall XR 20 mg every morning, Wellbutrin XL 450 every morning, clonidine 0.1 mg twice daily, lorazepam 1 mg every 6 hours as needed, olanzapine increased from 7.5 to 10 mg nightly on  Tolerating meds.  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  She does note some increased appetite and is somewhat concerned about that but has not gained significant amounts of weight. She has had the [place] consultation which was initially denied but she knows it can be appealed.  However because she is improving with Spravato plus the other medications now she wants to continue the current treatment plan.  07/18/22 appt noted: Continues meds Adderall XR 20 mg every morning, Wellbutrin XL 450 every morning, clonidine 0.1 mg twice daily, lorazepam 1 mg every 6 hours as needed, olanzapine increased from 7.5 to 10 mg nightly on 07/04/2022. Received Spravato 84 mg today as scheduled.  Tolerated it well without nausea or vomiting headache or chest pain or palpitations.  Expected dissociation and feels less depressed with resolution of negative emotions immediately after Spravato and then depression, anxiety creep back in. Continues meds Adderall XR 20 mg every morning, Wellbutrin XL 450 every morning, clonidine 0.1 mg twice daily, lorazepam 1 mg every 6 hours as needed, olanzapine increased from 7.5 to 10 mg nightly on  Tolerating meds.  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  She does note some increased appetite and is somewhat concerned about that but has not gained significant amounts of weight. She has had the [place] consultation which was initially denied but she knows it can be appealed. She continues to have chronic ambivalence about psychiatric medicines and initially tends to blame her depressive symptoms such as decreased concentration and feeling flat on what ever medicine she currently is taking even though she had the same symptoms before the current medicines were started.  Then after discussion she does admit that her depressive symptoms are improved since adding  olanzapine but still has those residual symptoms noted.  07/23/22 received Spravato 84 mg   07/30/2022 appointment noted: Received Spravato 84 mg today as scheduled.  Tolerated it well without nausea or vomiting headache or chest pain or palpitations.  Expected dissociation and feels less depressed with resolution of negative emotions immediately after Spravato and then depression, anxiety creep back in. Continues meds Adderall XR 20 mg every morning, Wellbutrin XL 450 every morning, clonidine 0.1 mg twice daily, lorazepam 1 mg every 6 hours as needed, olanzapine increased from 7.5 to 10 mg nightly on  She has been inconsistent with olanzapine because she continues to be ambivalent about the medications in general and thinks that perhaps the 10 mg is making her feel blunted.  She continues to feel some depression.  She had a good day this week and but still feels somewhat depressed and persistently anxious. Plan: be consistent with olanzapine 10 mg HS for TRD and longer trial for potential benefit for anxiety.  Has not taken it consistently.  08/06/2022 appointment noted: Received Spravato 84 mg today as scheduled.  Tolerated it well without nausea or vomiting headache or chest pain or palpitations.  Expected dissociation and feels less depressed with resolution of negative emotions immediately after Spravato and then depression, anxiety creep back in. Continues meds Adderall XR 20 mg every morning, Wellbutrin XL 450 every morning, clonidine 0.1 mg twice daily, lorazepam 1 mg every 6 hours as needed, olanzapine i 10 mg nightly  She continues to feel depressed but is about 50% better with Spravato.  She is still not herself.  She still has anhedonia.  She still is not her able to engage socially in the typical ways.  She is not jovial and outgoing like normal.  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  She continues to feel that her personality is dampened down.  There is a question about whether it is related to depression or medication. Plan: continue olanzapine 10 for longer trial for TRD and severe anxiety.  08/13/22 appt noted:  Received Spravato 84 mg today as scheduled.  Tolerated it well without nausea or vomiting headache or chest pain or palpitations.  Expected dissociation and feels less depressed with resolution of negative emotions immediately after Spravato and then depression, anxiety creep back in. Continues meds Adderall XR 20 mg every morning, Wellbutrin XL 450 every morning, clonidine 0.1 mg twice daily, lorazepam 1 mg every 6 hours as needed, olanzapine i 10 mg nightly  She still does not feel herself.  Still struggles with depression and low motivation and reduced social engagement and reduced interest and reduced emotional expression.  She is somewhat better with the medicines plus Spravato.  She still believes the Spravato makes her blunted and  is not sure how much it helps her anxiety.  She can have good days when her family is around and she is engaged.  She still wants to stop the olanzapine. She has apparently continued to take the trazodone despite having been told to stop it when she started olanzapine.  She feels like she needs the trazodone. Plan: DC olanzapine and Start nortriptyline 25 mg nightly and build up to 75 mg nightly and then check blood level.    08/27/2022 appointment noted: Received Spravato 84 mg today as scheduled.  Tolerated it well without nausea or vomiting headache or chest pain or palpitations.  Expected dissociation and feels less depressed with resolution of negative emotions immediately after Spravato and then depression, anxiety creep back in. Continues meds Adderall XR 20 mg every morning, Wellbutrin XL 450 every morning, clonidine 0.1 mg twice daily, lorazepam 1 mg every 6 hours as needed. Stopped olanzapine and started  nortriptyline which she has taken for about a week is 75 mg nightly. So far she is tolerating the nortriptyline well with the exception of some dry mouth and constipation which she is working to manage.  She does not feel substantially better better or different off the olanzapine.  No change in her sleep which is good.  Main concern currently in addition to the residual depression is anxiety which is somewhat situational with pending arch show.  She is worrying about it more than normal.  Says she is having to take lorazepam twice a day where she had been able to keep reduce it prior to this.  She still does not feel like herself with residual depression with less social interest and less of her usual buoyancy in personality.  She is flatter than normal.  Overall she still feels that the Spravato has been helpful at reducing the severity of the depression.  She is not suicidal. She has not heard anything about the [place] appeal as of yet.  09/05/2022 appointment noted: Received Spravato 84 mg today as scheduled.  Tolerated it well without nausea or vomiting headache or chest pain or palpitations.  Expected dissociation and feels less depressed with resolution of negative emotions immediately after Spravato and then depression, anxiety creep back in. Continues meds Adderall XR 20 mg every morning, Wellbutrin XL 450 every morning, clonidine 0.1 mg twice daily, lorazepam 1 mg every 6 hours as needed. Stopped olanzapine and started nortriptyline which she has taken for about 2 week is 75 mg nightly. Initially blood pressure was a little high causing delay in starting Spravato.  She admitted to feeling a little wound up.  She still experiences a little increase in depression if she goes longer than a week in between doses of Spravato.  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  09/10/22 appt noted: Received  Spravato 84 mg today as scheduled.  Tolerated it well without nausea or vomiting headache or chest pain or palpitations.  Expected dissociation gradually resolved over the 2 hour observation period. She feels 50% less depressed with Spravto and wants to continue it.   Continues meds Adderall XR 20 mg every morning, Wellbutrin XL 450 every morning, clonidine 0.1 mg twice daily, lorazepam 1 mg every 6 hours as needed. Has started nortriptyline 75 mg nightly for about 3 weeks. Has not seen a significant difference with the addition of nortriptyline.  Tolerating it pretty well. She continues  to have some degree of anhedonia and significant depression and anxiety.  Her daughters noticed that she is more needy and calls more frequently.  She acknowledges this as well.  She is clearly still not herself. Plan: pramipexole off label and RX 0.25 mg BID  09/17/2022 appointment noted: Received Spravato 84 mg today as scheduled.  Tolerated it well without nausea or vomiting headache or chest pain or palpitations.  Expected dissociation gradually resolved over the 2 hour observation period. She feels 50% less depressed with Spravto and wants to continue it.   Continues meds Adderall XR 20 mg every morning, Wellbutrin XL 450 every morning, clonidine 0.1 mg twice daily, lorazepam 1 mg every 6 hours as needed. Has started nortriptyline 75 mg nightly for about 3 weeks and DT level 176, reduced to 50 mg HS early November. Still the same sx as noted last visit.  Tolerating meds.   Compliant.  Still depressed and family notices.  Has been able to participate in family interactions.  Some post-show let down and has to do detailed work which is hard for her bc ADD.  Sleep and eating well.  Energy OK but not great.  No SI.  Not cried in a year or so.  Clearly less depressed and hopeless than before the Spravato.  09/24/22 appt noted: Received Spravato 84 mg today as scheduled.  Tolerated it well without nausea or vomiting  headache or chest pain or palpitations.  Expected dissociation gradually resolved over the 2 hour observation period. She feels 50% less depressed with Spravto and wants to continue it.   Continues meds Adderall XR 20 mg every morning, Wellbutrin XL 450 every morning, clonidine 0.1 mg twice daily, lorazepam 1 mg every 6 hours as needed. Has started nortriptyline 75 mg nightly for about 3 weeks and DT level 176, reduced to 50 mg HS early November. Still the same sx as noted last visit.  Tolerating meds.   Compliant.  Still depressed and family notices.  Has been able to participate in family interactions.  Some post-show let down and has to do detailed work which is hard for her bc ADD.  Sleep and eating well.  Energy OK but not great.  No SI.  Not cried in a year or so.  Clearly less depressed and hopeless than before the Spravato. Is not making further progress generally.  Stuck with moderate depression  10/02/22 appt noted: Received Spravato 84 mg today as scheduled.  Tolerated it well without nausea or vomiting headache or chest pain or palpitations.  Expected dissociation gradually resolved over the 2 hour observation period. She feels 50% less depressed with Spravto and wants to continue it.   Continues meds Adderall XR 20 mg every morning, Wellbutrin XL 450 every morning, clonidine 0.1 mg twice daily, lorazepam 1 mg every 6 hours as needed. Has started nortriptyline 75 mg nightly for about 3 weeks and DT level 176, reduced to 50 mg HS early November. Still the same sx as noted last visit.  Tolerating meds.   Compliant.  Still depressed and family notices.  Has been able to participate in family interactions.  Some post-show let down and has to do detailed work which is hard for her bc ADD.  Sleep and eating well.  Energy OK but not great.  No SI.  Not cried in a year or so.  Clearly less depressed and hopeless than before the Spravato. Is not making further progress generally.  Stuck with moderate  depression.  Is  able to function pretty normally. Plan: trial pramipexole 0.25 mg BID off label for depression.   10/08/22 appt noted: Received Spravato 84 mg today as scheduled.  Tolerated it well without nausea or vomiting headache or chest pain or palpitations.  Expected dissociation gradually resolved over the 2 hour observation period. She feels 50% less depressed with Spravto and wants to continue it.   Continues meds Adderall XR 20 mg every morning, Wellbutrin XL 450 every morning, clonidine 0.1 mg twice daily, lorazepam 1 mg every 6 hours as needed. Has started nortriptyline 75 mg nightly for about 3 weeks and DT level 176, reduced to 50 mg HS early November. Still the same sx as noted last visit.  Tolerating meds.   Compliant.  Still depressed and family notices.  Has been able to participate in family interactions.  Some post-show let down and has to do detailed work which is hard for her bc ADD.  Sleep and eating well.  Energy OK but not great.  No SI.  Not cried in a year or so.  Clearly less depressed and hopeless than before the Spravato. Is not making further progress generally.  Stuck with moderate depression.  Behaved and felt pretty normally with family over for Thanksgiving. Doesn't see benefit or SE with pramipexole but thinks maybe it makes her worse. Plan:  increase pramipexole augmentation off label to 0.5 mg BID  10/15/2022 appointment noted: Received Spravato 84 mg today as scheduled.  Tolerated it well without nausea or vomiting headache or chest pain or palpitations.  Expected dissociation gradually resolved over the 2 hour observation period. She feels 50% less depressed with Spravto and wants to continue it.   Continues meds Adderall XR 20 mg every morning, Wellbutrin XL 450 every morning, clonidine 0.2 mg twice daily, lorazepam 1 mg every 6 hours as needed.  Trazodone 50 mg tablets 1-2 nightly as needed insomnia Has started nortriptyline 75 mg nightly for about 3 weeks  and DT level 176, reduced to 50 mg HS early November. Recommended increase pramipexole 0.5 mg twice daily off label for treatment resistant depression on 10/08/2022. She feels better motivated more active with pramipexole 0.5 mg twice daily.  She still is depressed but it is better.  We discussed the possibility of going up in the dose but did not change it.  10/22/2022 appointment noted: Received Spravato 84 mg today as scheduled.  Tolerated it well without nausea or vomiting headache or chest pain or palpitations.  Expected dissociation gradually resolved over the 2 hour observation period. She feels 50% less depressed with Spravto and wants to continue it.   Continues meds Adderall XR 20 mg every morning, Wellbutrin XL 450 every morning, clonidine 0.2 mg twice daily, lorazepam 1 mg every 8 hours as needed.  Trazodone 50 mg tablets 1-2 nightly as needed insomnia Has started nortriptyline 75 mg nightly for about 3 weeks and DT level 176, reduced to 50 mg HS early November. pramipexole 0.5 mg twice daily off label for treatment resistant depression on 10/08/2022. She is better motivated than she was.  She is journaling 3 pages a day.  She has started walking and has walked 5 days a week for 50 minutes for the last 2 weeks.  That is significantly helped her mood.  Her mood tends to be better when she interacts with family.  However she still has some periods of depression.  She is tolerating the medications well.  She still feels like her affect and confidence is not  back to normal. Plan:  increase pramipexole augmentation off label to 0.5 mg tID or 0.75 mg BID   10/29/22 appt noted: Received Spravato 84 mg today as scheduled.  Tolerated it well without nausea or vomiting headache or chest pain or palpitations.  Expected dissociation gradually resolved over the 2 hour observation period. She feels 50% less depressed with Spravto and wants to continue it.   Continues meds Adderall XR 20 mg every  morning, Wellbutrin XL 450 every morning, clonidine 0.2 mg twice daily, lorazepam 1 mg every 8 hours as needed.  Trazodone 50 mg tablets 1-2 nightly as needed insomnia Has started nortriptyline 75 mg nightly for about 3 weeks and DT level 176, reduced to 50 mg HS early November. pramipexole increased to 0.75 mg twice daily off label for treatment resistant depression  She has been feeling somewhat better with the increase in pramipexole and is tolerating it well.  She still is easily overwhelmed.  Her affect and mood can improve now when around her family or doing something positive.  She has been able to be more productive. She is tolerating the current medications. She is still considering TMS as an alternative to Spravato.  11/15/2022 appointment noted: Received Spravato 84 mg today as scheduled.  Tolerated it well without nausea or vomiting headache or chest pain or palpitations.  Expected dissociation gradually resolved over the 2 hour observation period. She feels 50% less depressed with Spravto and wants to continue it.   Continues meds Adderall XR 20 mg every morning, Wellbutrin XL 450 every morning, clonidine 0.2 mg twice daily, lorazepam 1 mg every 8 hours as needed.  Trazodone 50 mg tablets 1-2 nightly as needed insomnia Has started nortriptyline 75 mg nightly for about 3 weeks and DT level 176, reduced to 50 mg HS early November. pramipexole increased to 0.75 mg twice daily off label for treatment resistant depression  He has noticed some increase in depression due to the length of time since the last Spravato administration.  She believes Spravato is helping her.  sHe is not suicidal but has felt very blue the last few days. She is tolerating the medication. She is ambivalent about Spravato versus [place] but she is considering [place]. She wants to continue Spravato administration because it is clearly helpful.  11/19/22 appt noted: Received Spravato 84 mg today as scheduled.  Tolerated it well  without nausea or vomiting headache or chest pain or palpitations.  Expected dissociation gradually resolved over the 2 hour observation period. She feels 50% less depressed with Spravto and wants to continue it.   Continues meds Adderall XR 20 mg every morning, Wellbutrin XL 450 every morning, clonidine 0.2 mg twice daily, lorazepam 1 mg every 8 hours as needed.  Trazodone 50 mg tablets 1-2 nightly as needed insomnia Has started nortriptyline 75 mg nightly for about 3 weeks and DT level 176, reduced to 50 mg HS early November. pramipexole increased to 0.75 mg twice daily off label for treatment resistant depression  She has felt better back on Spravato more regularly.  However she still has residual depression esp when alone or when wihtout activity.  Can function when needed.   Does not have her connfidence back. She plans to pursue [place] availability. Have discussed retrying [place]  11/28/22 appt noted: Received Spravato 84 mg today as scheduled.  Tolerated it well without nausea or vomiting headache or chest pain or palpitations.  Expected dissociation gradually resolved over the 2 hour observation period. She feels 50% less  depressed with Spravto and wants to continue it.   Continues meds Adderall XR 20 mg every morning, Wellbutrin XL 450 every morning, clonidine 0.2 mg twice daily, lorazepam 1 mg every 8 hours as needed.  Trazodone 50 mg tablets 1-2 nightly as needed insomnia Has started nortriptyline 75 mg nightly for about 3 weeks and DT level 176, reduced to 50 mg HS early November. pramipexole increased to 0.75 mg twice daily off label for treatment resistant depression  Last week she was more depressed than usual for reasons that are not clear.  She has not had a clear answer from [facility] [place] about [facility] options.  States that they have not returned her call.  She is asked about Auvelity retry in place of Wellbutrin.  Has still been walking.  12/03/22 appt noted: Received Spravato 84 mg today  as scheduled.  Tolerated it well without nausea or vomiting headache or chest pain or palpitations.  Expected dissociation gradually resolved over the 2 hour observation period. She feels 50% less depressed with Spravto and wants to continue it.   Continues meds Adderall XR 20 mg every morning, Wellbutrin XL 450 every morning, clonidine 0.2 mg twice daily, lorazepam 1 mg every 8 hours as needed.  Trazodone 50 mg tablets 1-2 nightly as needed insomnia started nortriptyline 75 mg nightly for about 3 weeks and DT level 176, reduced to 50 mg HS early November. pramipexole increased to 0.75 mg twice daily off label for treatment resistant depression  No SE .  Satisfied with meds. Depression is better in the last week.  Not sure why that is the case.  Still walking daily and that helps and journaling 3 pages daily.  Working on [employer], academic.  No changes desire.  Clearly benefits from Spravato but not 100%.  Still considering TMS.  12/10/22 appt noted: Received Spravato 84 mg today as scheduled.  Tolerated it well without nausea or vomiting headache or chest pain or palpitations.  Expected dissociation gradually resolved over the 2 hour observation period. She feels 50% less depressed with Spravto and wants to continue it.   Continues meds Adderall XR 20 mg every morning, Wellbutrin XL 450 every morning, clonidine 0.2 mg twice daily, lorazepam 1 mg every 8 hours as needed.  Trazodone 50 mg tablets 1-2 nightly as needed insomnia started nortriptyline 75 mg nightly for about 3 weeks and DT level 176, reduced to 50 mg HS early November. pramipexole increased to 0.75 mg twice daily off label for treatment resistant depression  No SE .   Depression was worse this week for no apparent reason.  She struggled being positive.  She has felt more discouraged.  She has felt more anxious.  She has had a hard time doing tasks.  She is interested in retrying the [place] which we had discussed previously. Plan: She agrees to  [facility].  To improve tolerability and reduce risk of side effects, Stop Wellbutrin and start Auvelity 1 in the morning for 1 week then 1 twice daily AndReduce pramipexole 0.5 mg BID and reduce nortriptyline to 50 mg HS  12/17/22 appt noted: Received Spravato 84 mg today as scheduled.  Tolerated it well without nausea or vomiting headache or chest pain or palpitations.  Expected dissociation gradually resolved over the 2 hour observation period. She feels 50% less depressed with Spravto and wants to continue it.   Continues meds Adderall XR 20 mg every morning, stopped Wellbutrin XL 150 every morning, clonidine 0.2 mg twice daily, lorazepam 1 mg every  8 hours as needed.  Trazodone 50 mg tablets 1-2 nightly as needed insomnia Has started nortriptyline 75 mg nightly for about 3 weeks and DT level 176, reduced to 50 mg HS early November. pramipexole 0.375 mg twice daily off label for treatment resistant depression with plan to stop Started Auvelity 1 AM Still depressed to moderate degree.  Tolerating Auvelity so far.  No other problems with meds.  Willing to give Auvelity a chance. Plan: She agrees to [facility].  Continue 1 AM and when tolerated then increase to BID Stop pramipexole.  12/26/22 received Spravato  01/01/23 appt noted: Received Spravato 84 mg today as scheduled.  Tolerated it well without nausea or vomiting headache or chest pain or palpitations.  Expected dissociation gradually resolved over the 2 hour observation period. She feels 50% less depressed with Spravto and wants to continue it.   Continues meds Adderall XR 20 mg every morning, stopped Wellbutrin XL 150 every morning, clonidine 0.2 mg twice daily, lorazepam 1 mg every 8 hours as needed.  Trazodone 50 mg tablets 1-2 nightly as needed insomnia Has started nortriptyline 75 mg nightly for about 3 weeks and DT level 176, reduced to 50 mg HS early November. Forgot to reduce pramipexole stoll taking  0.5 mg twice daily off  label for treatment resistant depression  Started Auvelity 1 AM & PM last week. Occ misses Spravato DT htn.this week a better than last.   Painting again more and it helps mood.  Confidence still low and not as likely to socialize as normal but enjoys family.   Still ambivalent about [place]. Tolerating meds.  Including the increase in [place].    01/08/23 appt noted: Received Spravato 84 mg today as scheduled.  Tolerated it well without nausea or vomiting headache or chest pain or palpitations.  Expected dissociation gradually resolved over the 2 hour observation period. She feels 50% less depressed with Spravto and wants to continue it.   Continues meds Adderall XR 20 mg every morning, stopped Wellbutrin XL 150 every morning, clonidine 0.2 mg twice daily, lorazepam 1 mg every 8 hours as needed.  Trazodone 50 mg tablets 1-2 nightly as needed insomnia Has started nortriptyline 75 mg nightly for about 3 weeks and DT level 176, reduced to 50 mg HS early November. reduced pramipexole to 0.5 mg daily off label for treatment resistant depression  Started Auvelity 1 AM & PM mid Feb. More depressed as week progresses.  Thinks it is worse with less pramipexole.  More negative.  Able to function but feels miserable.  No SI.  Hopeless.  Sleep ok.  Tolerating meds.   Talked to [facility] about [place] and appt mid March. Plan: increase pramipexole to 0.5 mg TID for dep bc seemed to worsen with the reduction.  01/15/23 appt noted: Received Spravato 84 mg today as scheduled.  Tolerated it well without nausea or vomiting headache or chest pain or palpitations.  Expected dissociation gradually resolved over the 2 hour observation period. She feels 50% less depressed with Spravto and wants to continue it.   Continues meds Adderall XR 20 mg every morning, stopped Wellbutrin XL 150 every morning, clonidine 0.2 mg twice daily, lorazepam 1 mg every 8 hours as needed.  Trazodone 50 mg tablets 1-2 nightly as needed  insomnia Has started nortriptyline 75 mg nightly for about 3 weeks and DT level 176, reduced to 50 mg HS early November. Increased pramipexole to 0.5 mg  to TID  off label for treatment resistant depression  Started Auvelity 1 AM & PM mid Feb. markedly better depression the increase in pramipexole.  She feels like her depression is almost resolved.  She is very pleased with the response.  She is tolerating the medications well  ECT-MADRS    Flowsheet Row Clinical Support from 01/08/2023 in [place] from 08/06/2022 in [place] from 07/04/2022 in [place] from 05/21/2022 in [place] Office Visit from 03/02/2022 in [facility]  MADRS Total Score 21 29 15 27  46        Past Psychiatric Medication Trials: fluoxetine, duloxetine, Viibryd, Pristiq, sertraline, citalopram,  Trintellix anxious and SI Wellbutrin XL 450 Auvelity 1 dose nortriptyline 75 mg nightly for about 3 weeks and DT level 176, reduced to 50 mg HS NR. Pramipexole 1 mg NR Adderall, Adderall XR, Vyvanse, Ritalin, Strattera low dose NR Lorazepam Trazodone  Depakote,  lamotrigine cog complaints Lithium remotely Abilify 7.5  Vraylar 1.5 mg daily agitation and insomnia Rexulti insomnia Latuda 40 one dose, CO anxious and SI Seroquel XR 300 Olanzapine 10  At visit November 12, 2019. We discussed Patient developed an increasingly severe alcohol dependence problem since her last visit in January.  She went to [facility] and has had no alcohol since then except 1 day.  She never abused stimulants but they took her off the stimulants at [facility].  Her ADD was markedly worse.  The Wellbutrin did not help the ADD.   D history lamotrigine rash at 67 yo  Review of Systems:  Review of Systems  Constitutional:  Positive for  fatigue.  HENT:  Negative for congestion.   Musculoskeletal:  Positive for back pain. Negative for arthralgias and joint swelling.       SP hip surgery October 2020  Neurological:  Negative for dizziness and tremors.  Psychiatric/Behavioral:  Positive for decreased concentration and dysphoric mood. Negative for agitation, behavioral problems, confusion, hallucinations, self-injury, sleep disturbance and suicidal ideas. The patient is nervous/anxious. The patient is not hyperactive.        Forgetful at times about med recommendations.  Send present for him The only [place] she notices a it states it is unbelievable him how how much are her 3 steps around freedom of the present fall and just in the past decade  Medications: I have reviewed the patient's current medications.  Current Outpatient Medications  Medication Sig Dispense Refill   amLODipine (NORVASC) 2.5 MG tablet Take 2.5 mg by mouth daily.     amphetamine-dextroamphetamine (ADDERALL XR) 20 MG 24 hr capsule Take 1 capsule (20 mg total) by mouth every morning. 30 capsule 0   amphetamine-dextroamphetamine (ADDERALL XR) 20 MG 24 hr capsule Take 1 capsule (20 mg total) by mouth every morning. 30 capsule 0   buPROPion (WELLBUTRIN XL) 150 MG 24 hr tablet TAKE 3 TABLETS BY MOUTH DAILY (Patient taking differently: Take 150 mg by mouth daily.) 270 tablet 1   cloNIDine (CATAPRES) 0.2 MG tablet Take 1 tablet (0.2 mg total) by mouth 2 (two) times daily. 60 tablet 2   Dextromethorphan-buPROPion ER (AUVELITY) 45-105 MG TBCR Take 1 tablet by mouth 2 (two) times daily. 60 tablet 1   Esketamine HCl, 84 MG Dose, (SPRAVATO, 84 MG DOSE,) 28 MG/DEVICE SOPK USE 3 SPRAYS IN EACH NOSTRIL ONCE A WEEK 3 each 1   iron polysaccharides (NIFEREX) 150 MG capsule TAKE 1 CAPSULE BY MOUTH EVERY DAY  90 capsule 1   LORazepam (ATIVAN) 1 MG tablet Take 1 tablet (1 mg total) by mouth every 8 (eight) hours as needed. for anxiety 90 tablet 0   losartan (COZAAR) 50 MG tablet  Take 50 mg by mouth daily.     nebivolol (BYSTOLIC) 2.5 MG tablet Take 2.5 mg by mouth daily.     nortriptyline (PAMELOR) 25 MG capsule Take 2 capsules (50 mg total) by mouth at bedtime. 180 capsule 0   pramipexole (MIRAPEX) 0.5 MG tablet Take 1 tablet (0.5 mg total) by mouth 3 (three) times daily. 90 tablet 0   traZODone (DESYREL) 50 MG tablet TAKE 1-2 TABLETS BY MOUTH NIGHTLY AS NEEDED FOR SLEEP 180 tablet 1   No current facility-administered medications for this visit.    Medication Side Effects: None  Allergies:  Allergies  Allergen Reactions   Metronidazole Shortness Of Breath and Other (See Comments)    Heart pounding   Ferrlecit [Na Ferric Gluc Cplx In Sucrose] Other (See Comments)    Infusion reaction 05/12/2019    Past Medical History:  Diagnosis Date   ADHD    Anemia    Anxiety    Arthritis    Depression    Heart murmur    i went to see a cardiologit slast eyar  and i had zero plaque,    PONV (postoperative nausea and vomiting)    Recovering alcoholic in remission ([place])     Family History  Problem Relation Age of Onset   Atrial fibrillation Mother    CAD Father     Past Medical History, Surgical history, Social history, and Family history were reviewed and updated as appropriate.   Please see review of systems for further details on the patient's review from today.   Objective:   Physical Exam:  There were no vitals taken for this visit.  Physical Exam Constitutional:      General: She is not in acute distress. Neurological:     Mental Status: She is alert and oriented to person, place, and time.     Coordination: Coordination normal.     Gait: Gait normal.  Psychiatric:        Attention and Perception: Attention and perception normal.        Mood and Affect: Mood is anxious. Mood is not depressed. Affect is not labile or tearful.        Speech: Speech is not rapid and pressured, slurred or tangential.        Behavior: Behavior is not slowed.         Thought Content: Thought content is not paranoid or delusional. Thought content does not include homicidal or suicidal ideation. Thought content does not include suicidal plan.        Cognition and Memory: Cognition normal. Memory is not impaired. She does not exhibit impaired recent memory.        Judgment: Judgment normal.     Comments: Insight intact. No auditory or visual hallucinations. No delusions.  Depression resolved 2 weeks ago     Lab Review:     Component Value Date/Time   NA 137 01/12/2021 1430   NA 140 11/18/2018 1544   K 3.8 01/12/2021 1430   CL 108 01/12/2021 1430   CO2 22 01/12/2021 1430   GLUCOSE 94 01/12/2021 1430   BUN 14 01/12/2021 1430   BUN 20 11/18/2018 1544   CREATININE 0.82 01/12/2021 1430   CALCIUM 8.9 01/12/2021 1430   PROT 6.6 01/12/2021 1430  ALBUMIN 3.9 01/12/2021 1430   AST 12 (L) 01/12/2021 1430   ALT 11 01/12/2021 1430   ALKPHOS 46 01/12/2021 1430   BILITOT 0.5 01/12/2021 1430   GFRNONAA >60 01/12/2021 1430   GFRAA >60 09/02/2019 0249   GFRAA >60 01/27/2019 0811       Component Value Date/Time   WBC 4.5 01/12/2021 1430   RBC 4.32 01/12/2021 1430   HGB 12.8 01/12/2021 1430   HGB 12.9 07/17/2019 0953   HCT 38.5 01/12/2021 1430   HCT 21.9 (L) 12/25/2018 1221   PLT 272 01/12/2021 1430   PLT 286 07/17/2019 0953   MCV 89.1 01/12/2021 1430   MCH 29.6 01/12/2021 1430   MCHC 33.2 01/12/2021 1430   RDW 12.4 01/12/2021 1430   LYMPHSABS 1.4 01/12/2021 1430   MONOABS 0.4 01/12/2021 1430   EOSABS 0.0 01/12/2021 1430   BASOSABS 0.0 01/12/2021 1430    No results found for: "POCLITH", "LITHIUM"   No results found for: "PHENYTOIN", "PHENOBARB", "VALPROATE", "CBMZ"   .res Assessment: Plan:    Recurrent major depression resistant to treatment ([place])  Generalized anxiety disorder  Attention deficit hyperactivity disorder (ADHD), predominantly inattentive type  Insomnia due to mental condition  Accelerated hypertension   She has  treatment resistant major depression ongoing with 50% better with Spravato. Have  discussed some of her  abnormal behaviors last year leading to this depressive episode getting worse which she says were associated with heavy use of delta 8 and not a manic episode.  She realizes now that that was not good for her.  She stopped all use of other drugs including those available over-the-counter such as delta 8 or any other THC related products.  She is no longer having any of those types of behaviors and instead is depressed.  She is experiencing more pleasure and is less blunted and enjoying more and better inteest versus before the Spravato.  She also feels worse if misses a dose of Spravato.  Depression resolved 2 weeks ago, after increasing pramipexole 0.5 mg TID  Patient was administered Spravato 84 mg intranasally today.  The patient experienced the typical dissociation which gradually resolved over the 2-hour period of observation.  There were no complications.  Specifically the patient did not have nausea or vomiting or headache.  Blood pressures monitored at the 40-minute and 2-hour follow-up intervals.  Borderline high.  By the time the 2-hour observation period was met the patient was alert and oriented and able to exit without assistance.  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  See nursing note for further details.She wants to continue Spravato. We discussed discussed the side effects in detail as well as the protocol required to receive Spravato.   Failed multiple antidepressants.  Many of them were not actual failures but intolerances and it is unclear whether some of that was more connected with anxiety than true side effects.  1 example is the [place].  In general she does not want to try anything but an antidepressant but has failed all major categories of antidepressants except TCAs and MAO  inhibitors which have not been tried until now.  Started nortriptyline 75 mg nightly.  Serum level 176.  So Reduced to 50 mg HS  Discussed side effects in detail.  Needs more time to help. Extensive discussion previously about her ambivalence about meds and missatributing sx of depression as SE of meds.    She is tolerating  Auvelity.  Continue 1  BID for at least 2 more weeks.  Worse with less pramipexole. Increased to 0.5 mg TID on about end of Feb 2024 and markedly better Dosing range off label for depression ranges from 1-5 mg daily.  Disc risk compulsive impulsive behavior and manis.    Started Spravato 84 mg twice weekly on 03/16/2022.  Now on weekly administration  Adderall  XR 20 mg AM   Ativan 1 mg 3 times daily as needed anxiety but try to cut it back. Is not ideal to use benzodiazepine with stimulant but because of the severity of her symptoms it has been necessary.  Hope to eventually eliminate the benzodiazepine.  Expected as her depression improves her anxiety will improve as well.  However lately her anxiety has been unmanageable.  We will expect that to improve as the depression improves.  She has headed insturctions to reduce this.  Continue clonidine 0.2 mg BID off label for anxiety and helps BP partially. BP is better controlled but not consistent.  Consider increasing amlodipine.  Also on losartan 50  Discussed potential benefits, risks, and side effects of stimulants with patient to include increased heart rate, palpitations, insomnia, increased anxiety, increased irritability, or decreased appetite.  Instructed patient to contact office if experiencing any significant tolerability issues. She wants to return to usual dose of Adderall for ADD bc of mor poor cognitive function with reduction.  Also discussed that depression will impair cognitive function.  [facility] consultation was initially denied .  She says she called [facility] about this but they have not scheduled an appt .Marland Kitchen    Consider consultation with [facility] if [facility] is not an option.  We discussed the pros and cons of this versus Spravato.  For now she wants to continue Spravato because it is clearly helpful.  Extensive discussion about her chronic ambivalence about psychiatric medicines and tendency to blame medicine for the symptoms of depression that she had prior to even starting the medications.  After discussing this at length she was able to acknowledge that that is factual.  She does not appear to be having any significant side effects with the current medications.  However so far not much benefit with nortriptyline despite adequate level.  Disc risk pllypharmacy  Rec keep track of BP and discuss with PCP  Has Maintained sobriety  FU with Spravato weekly   Lynder Parents, MD, DFAPA     Please see After Visit Summary for patient specific instructions.  No future appointments.                 No orders of the defined types were placed in this encounter.      -------------------------------

## 2023-01-24 ENCOUNTER — Other Ambulatory Visit: Payer: Self-pay

## 2023-01-24 ENCOUNTER — Telehealth: Payer: Self-pay | Admitting: Psychiatry

## 2023-01-24 DIAGNOSIS — F900 Attention-deficit hyperactivity disorder, predominantly inattentive type: Secondary | ICD-10-CM

## 2023-01-24 MED ORDER — AMPHETAMINE-DEXTROAMPHET ER 20 MG PO CP24: 20.0000 mg | ORAL_CAPSULE | ORAL | 0 refills | Status: DC

## 2023-01-24 NOTE — Telephone Encounter (Signed)
Haley at [facility] [employer] LVM @ 11:20a.  She said the pt has been to their facility and want [facility] therapy.  Her insurance, [employer] denied it due to the dx of being bipolar.  They want to know if Dr Clovis Pu will write a note saying the pt has depression, if she also has that.  Then they will try to appeal the denial.  No upcoming appt scheduled.

## 2023-01-24 NOTE — Telephone Encounter (Signed)
Please see message in regards to [place].

## 2023-01-26 ENCOUNTER — Other Ambulatory Visit: Payer: Self-pay | Admitting: Psychiatry

## 2023-01-26 DIAGNOSIS — I10 Essential (primary) hypertension: Secondary | ICD-10-CM

## 2023-01-26 DIAGNOSIS — F411 Generalized anxiety disorder: Secondary | ICD-10-CM

## 2023-01-30 ENCOUNTER — Ambulatory Visit: Payer: 59

## 2023-01-30 ENCOUNTER — Ambulatory Visit (INDEPENDENT_AMBULATORY_CARE_PROVIDER_SITE_OTHER): Payer: 59 | Admitting: Psychiatry

## 2023-01-30 VITALS — BP 118/75 | HR 83

## 2023-01-30 DIAGNOSIS — F339 Major depressive disorder, recurrent, unspecified: Secondary | ICD-10-CM

## 2023-01-30 DIAGNOSIS — F900 Attention-deficit hyperactivity disorder, predominantly inattentive type: Secondary | ICD-10-CM

## 2023-01-30 DIAGNOSIS — F5105 Insomnia due to other mental disorder: Secondary | ICD-10-CM | POA: Diagnosis not present

## 2023-01-30 DIAGNOSIS — F411 Generalized anxiety disorder: Secondary | ICD-10-CM

## 2023-01-30 DIAGNOSIS — I10 Essential (primary) hypertension: Secondary | ICD-10-CM

## 2023-01-30 NOTE — Telephone Encounter (Signed)
She feels better current meds and Spravato and is ok with deferring [facility].

## 2023-01-30 NOTE — Progress Notes (Signed)
Laura Chang [id number] 05-Oct-1956 67 y.o.  Subjective:   Patient ID:  Laura Chang is a 67 y.o. (DOB 09-01-1956) female.  Chief Complaint:  Chief Complaint  Patient presents with   Follow-up   Depression   Anxiety   ADD      Laura Chang presents to the office today for follow-up of depression and anxiety and ADD.  seen November 12, 2019.  Melted down in 2020.  Went to [employer] in July.  No withdrawal.  1 drink since then.  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 She was on no stimulant and no SSRI but was taking Strattera and Wellbutrin.  The following changes were made. Stop Strattera. OK restart stimulant bc severe ADD Restart Adderall 1 daily for a few days and if tolerated then restart 1 twice daily. If not tolerated reduce the dosage if needed. May need to stop Wellbutrin if not tolerating the stimulant.  Yes.  DC Wellbutrin Restart Prozac 20 mg daily.  February 2021 appointment with the following noted: Completed grant proposal.  Couldn't doit without Adderall.  Sold a bunch of work.   Adderall XR lasts about 3 pm.  Strength seems about right.  BP been OK.  Not jittery.   Stopped Wellbutrin but had no SE. Mood drastically better with grant proposal and back on fluoxetine.  Less depressed and lethargic.  No anxiety.  Cut back on coffee. Started back with devotions and stronger faith. Plan: Continue Prozac 20 mg daily. May have to increase the dose at some point in the future given that she usually was taking higher dosages but she is getting good response at this time. Restart Wellbutrin off label for ADD since can't get 2 ADDERALL daily. 150 mg daily then 300 mg daily. She can adjust the dose between 150 mg and 300 mg daily to get the optimal effect.   05/11/2020 appointment with the following noted: Has been inconsistent with Prozac and Wellbutrin. Not sure of the effect of Wellbutrin. Biggest deterrent in work is anxiety.   Some of the work is conceptual and difficult at times.  Can feel she's not up to a project at times.  Overall is OK but would like a steadier benefit from stimulant.  Exhausted from managing concentration and keeping up with things from the day.  Loses things.  Not good keeping up with schedule. Overall productive and emotionally OK. Can feel Adderall wear off. Mood is better in summer and worse in the winter.   F died in 10-15-2023 and that is a loss. No SE Wellbutrin. Still attends AA meetings.  Real benefit from [facility] last year. Recognizes effect of anemia on ADD and mood.  Had iron infusions last winter. Plan:  Wellbutrin off label for ADD since can't get 2 ADDERALL daily. 150 mg daily then 300 mg daily.  01/24/2021 appointment with following noted: Doing a program called [employer] mindfulness app since Xmas.  CBT app helped the depression.  App helped her focus better.  Lost sign weight. Writing a lot. Before Xmas felt depressed and started negative thinking worse, self denigrating. Not drinking. More isolated.   Recognizes mo is narcissist.    Didn't tell anyone she was born until 3 mos later.  M aloof and uninterested in pt.  Lied about her birthday.  Mo lack of affection even with pt's kids. Going to [place] for a year and it helped her to quit drinking. Also misses kids being gone  with a hole also. Plan: No med changes  05/04/2021 appointment with the following noted: Therapist [name] thinks she's manic. Lost weight to 144#.   States she is still sleeping okay.  Admits she is hyper and recognizes that she is likely manic.  She feels great, euphoric with an increased sense of spiritual connectedness to God.  She has racing thoughts and talks fast and talks a lot and this is noted by her husband.  He thinks she is a bit hyper.  She has been able to maintain sobriety although she will have 1 glass of wine on special occasions but does not drink by herself.  She is not drinking to  excess.  She denies any dangerous impulsivity.  She is clearly not depressed and not particularly anxious.  She has no concerns about her medication and she has been compliant.  06/16/21 appt noted: So much better.  Going through a lot but the manic thing happened on top of it.  So much slower.  Didn't feel like losing anything with risperidone.  Likes the Adderalll at 10 mg. Some drowsiness in the AM and very drowsy from risperidone 2 mg HS. Prayer life is better. Handling stress better. Less depressed with risperidone. Still likes trazodone. Sleeps well. Plan: Reduce Prozac to 10 mg daily.  Consider stopping it because it can feel the mania however she is reluctant to do that because she fears relapse of depression. Reduce risperidone to 1.5 mg nightly due to side effects.  Discussed risk of worsening mania.  07/25/2021 appointment with the following noted: Misses the Adderall and hard to function without it. Depressed now. Heavy chest.  Anxious and guilty.  Body feels heavy.   Hates Wellbutrin.   Plan: Increase fluoxetine to 20 mg daily Add Abilify 1/2 of 15 mg tablet daily Wean wellbutrin by 1 tablet each week  bc she feels it is not helpful and DT polypharmacy Reduce risperidone to 1 daily for 1 week and stop it. Disc risk of mania. Increase Adderall to XR 20 mg AM  08/08/21 Much less depressed and starting to feel normal I feel a lot better. No SE.  Speech normal off risperidone. Sleeping OK on trazaodone and enough.   Noticed benefit from Adderall again. Plan: continue fluoxetine to 20 mg daily Continue Abilify 1/2 of 15 mg tablet daily for depression and mania continue Adderall to XR 20 mg AM  10/10/2021 phone call: Pt stated she feels like the Abilify should be decreased to 23m.She said she is depressed but rational and not suicidal.She has an appt Monday and can wait until then if you prefer. MD response: Reduce the Abilify to 7.5 mg every other day.  We will meet on 10/16/2021  and decide what to do from there.  10/16/2021 appointment with the following noted: More depressed.  Most depressed I've ever been.  Just numb.  Sense of grief.   Thinks the manic episode was unlike anything else she ever had.  Doesn't want to medicate against it.  Don't enjoy people.  Easily overwhelmed.  Had some death thoughts but not suicidal.  Has been functional.  Feels better today after reducing Abilify to every other day but she is only been doing that for 3 days. A/P: Episode of post manic depression was explained. continue fluoxetine to 20 mg daily Hold Abilify for 1 week then resume Abilify 1/2 of 15 mg tablet every other day for depression and mania continue Adderall to XR 20 mg AM  10/27/2021  appointment with the following noted: I'm doing so much better.  Handling the depression better. Better self talk and spiritual focus has helped.   Dep 6/10 manifesting as anxiety with low confidence.   F died 2  years ago and M 67 yo and is dependent . She is working hard to feel better but still feels depressed.  She almost feels like she has a little more anxiety since restarting Abilify every other day. Plan: continue fluoxetine to 20 mg daily DC Abilify .  Vrayalar 1.5 mg QOD to try to get rid of depression ASAP. continue Adderall to XR 20 mg AM  11/10/2021 appointment with the following noted: Busy with Xmas and it was fun with family but then a big let down.  Did well with it.  Functioned well with it.  Working hard on things with depression.  Not shutting down. Not sure but feels better today but yesterday was hard.  Difficulty dealing with mother.  She won't do anything to help herself.  Yesterday with her all day.  Won't do PT and has isolated herself.    Lack of confidence.   No SE with Vraylar.  11/24/21 urgent appointment appt noted: More and more depressed.   So anxious and doesn't want to be alone but can do so. No appetite. Hurts inside. Has had some fleeting suicidal  thoughts but would not act on them.  Tolerating meds. Has been consistent with Vraylar 1.5 mg every other day, fluoxetine 20 mg daily Plan: Increase Vraylar to 1.5 mg daily Change Prozac to Trintellix 10 mg daily. Discussed side effects of each continue Adderall to XR 20 mg AM  12/27/2021 appointment with the following noted: Not OK.  I feel less depressed but feels bat shit. Not sleeping well.  Extremely anxious. Off and on sleep. 3-4 hours of sleep.   Still having daily SI.  But also become obvious has so much to do.  Overwhelmed by tasks.   Needs anxiety meds to just function. Not more motivated.  Walked yesterday.   Feels afraid like in trouble but not irritable or angry. DC DT agitation Vraylar to 1.5 mg daily Change Prozac to Trintellix 10 mg daily. Hold Adderall to XR 20 mg AM Clonidine 0.1 1/2 tablet twice daily for 2 days and if needed for anxiety and sleep increase to 1 twice daily Ok temporary Ativan 1 mg 3 times daily as needed anxiety  01/05/22 appt noted: Off fluoxetine and  Trintellix.  Only on Ativan, trazodone and Adderall XR 20 plus added clonidine 0.1 mg BID Didn't think she needed to start Trintellix. Not taking Ativan.   Didn't like herself last week. Feels some better today. Wonders if the manic sx Not agitated.  Anxiety kind of calmed down.  A lot to be anxious about situationally.  $ stress. Concerns about downers with meds. Can't access normal personality. ? Lethargy and inability to talk as sE. Plan: Latuda 20-40 mg daily with food. Adderall to XR 20 mg AM Clonidine 0.1 1/2 tablet twice daily  reduce dose to be sure no SE Ok temporary Ativan 1 mg 3 times daily as needed anxiety  01/19/22 appt noted: Taking Latuda 20 mg daily.  Took 40 mg once and felt anxious and  SI Still depressed and not very reactive Anxiety mainly about the depression and fears of the future. She wants to revisit manic sx and thinks it was maybe bc taking delta 8 bc was taking a lot  of it so still  doesn't think she's classic bipolar. She wants to only take Prozac bc thinks Latuda is perpetuating depression. Says the delta 8 was very psychaedelic.  When not taking it was not manic.  Sleeping ok again.  Plan: Per her request DC Latuda 20-40 mg daily with food. She wants to continue Prozac alone AMA  Adderall to XR 20 mg AM Clonidine 0.1 1/2 tablet twice daily  reduce dose to be sure no SE Ok temporary Ativan 1 mg 3 times daily as needed anxiety  01/23/2022 phone call complaining of increased anxiety since stopping Latuda.  She will try increasing clonidine.  01/26/2022 phone call not feeling well and wanted to restart the Vraylar.  However notes indicate that had made her agitated therefore she was encouraged to pick up samples of Rexulti 1 mg and start that instead.  02/06/2022 phone call: Stating she felt the Rexulti was helping with depression but she was not sleeping well and obsessing over things.  She was encouraged to increase Rexulti to 2 mg daily and increase trazodone for sleep.  02/09/2022 appointment with the following noted: This was an urgent work in appointment No sleep last night with trazodone 100 mg HS Nothing really better depression or anxiety. Ruminating negative anxious thoughts. Did not tolerate Rexulti because it was causing insomnia.  Does not think it helped depression.  Lacks emotion that she should have.  Lacks her usual personality.  Some hopeless thoughts.  Some death thoughts.  Some suicidal thoughts without plan or intent Plan: DC Rexulti and Prozac & DC trazodone Adderall to XR 20 mg AM Clonidine 0.1 1/tablet twice daily  reduce dose to be sure no SE Ok temporary Ativan 1 mg 3 times daily as needed anxiety Start Seroquel XR 150 mg nightly  03/02/2022 appointment: Langley Gauss called back a few days after starting Seroquel stating it was making her more anxious and more depressed.  This seemed unlikely as this medicine rarely ever causes anxiety.   She stopped the medication waited 3 days and called back still had anxiety and depression but thought perhaps the anxiety was a little better.  She did not want to take the Seroquel. She knew about the option of Spravato and wanted to pursue that. Now questions whether to return to Seroquel while waiting to start Spravato bc feels just as bad without it and knows she didn't give it enough time to work.   MADRS 46  ECT-MADRS    Flowsheet Row Clinical Support from 01/08/2023 in [place] from 08/06/2022 in [place] from 07/04/2022 in [place] from 05/21/2022 in [place] Office Visit from 03/02/2022 in [facility]  MADRS Total Score '21 29 15 27 '$ 46      03/14/22 appt noted: Pt received Spravato 56 mg first dose today with some dissociative sx which were not severe.  She was anxious prior to the administration and felt better after receiving lorazepam 1 mg.  No NV, or HA. Wants to continue Spravato. Ongoing depression and desperate to feel better.  I'm not myself DT deprsssion which is most severe in recent history.  Anhedonia.  Low motivation.  Social avoidance. Continues to think all recent med trials are making her worse.  Sleep ok with Seroquel.  03/16/22 appt noted: Received Spravato 84 mg for the first time.  some dissociative sx which were not severe.  She was anxious prior to  the administration and felt better after receiving lorazepam 1 mg.  No NV, or HA. Wants to continue Spravato.   Does not feel any better or different since the last appt.  Ongoing depression.  Ongoing depression and desperate to feel better.  I'm not myself DT deprsssion which is most severe in recent history.  Anhedonia.  Low motivation.  Social avoidance. Continues to think all recent med trials are making her worse.  Sleep  ok with Seroquel.  Does not want to continue Seroquel for TRD.  03/20/2022 appointment noted: Came for Spravato administration today.  However blood pressure was significantly elevated approximately 180/115.  She was given lorazepam 1 mg and clonidine 0.2 mg to try to get it down. She states she regretted stopping the Seroquel XR 300 mg tablets.  She now realizes it was helpful.  She did not sleep much at all last night.  She did not take the Adderall this morning. 2 to 3 hours after arrival blood pressure was still elevated at  170/110, 62 pulse.  For Spravato administration was canceled for today.  She admits to being anxious and depressed.  She is not suicidal.  She is highly motivated to receive the Spravato.  We discussed getting it tomorrow.  03/22/2022 appointment noted: Patient's blood pressure was never stable enough yesterday in order to get her in for Spravato administration.  She was encouraged to see her primary care doctor.  It is better today.  03/26/2022 appointment with the following noted: Blood pressure was better.  Saw her primary care doctor who started on oral Bystolic 2.5 mg daily. Received Spravato 84 mg today as scheduled.  Tolerated it well without nausea or vomiting headache or chest pain or palpitations.  Her blood pressure was borderline but manageable. She remains depressed and anxious.  She is ambivalent about the medicine and desperate to get to feel better.  Continues to have anhedonia and low energy and low motivation and reduced ability to do things.  Less social.  Not suicidal.  03/28/22 appt noted: Received Spravato 84 mg today as scheduled.  Tolerated it well without nausea or vomiting headache or chest pain or palpitations.  Her blood pressure was borderline but manageable. Has not seen any improvement so far.  Tolerating Seroquel.  Inconsistent with Bystolic and BP has been borderline high. Still depressed and anxious and anhedonia.  Low motivation, energy,  productivity. Taking quetiapine and tolerating XR 300 mg nightly.  04/04/22 appt noted: Received Spravato 84 mg today as scheduled.  Tolerated it well without nausea or vomiting headache or chest pain or palpitations.  Her blood pressure was borderline but manageable. Has not seen any improvement so far.  Tolerating Seroquel.   She still tends to think that the medications are making her worse.  She has said this about each of the recent psychiatric medicines including Seroquel.  However her husband thinks she is improved.  She also admits there is some improvement in productivity.  She still feels highly anxious.  She still does not enjoy things as normal.  She still feels desperate to improve as soon as possible. Has been taking Seroquel XR since 03/20/2022  04/10/22 appt noted: Received Spravato 84 mg today as scheduled.  Tolerated it well without nausea or vomiting headache or chest pain or palpitations.  Her blood pressure was borderline but manageable. Has not seen any improvement so far.  Tolerating Seroquel.  Doesn't like Seroquel bc she thinks it flattens here. 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  Tolerated it well without nausea or vomiting headache or chest pain or palpitations.  Her blood pressure was borderline but manageable. Has not seen any improvement so far.  Tolerating Seroquel.  Doesn't like Seroquel bc she thinks it flattens her. Received Spravato 84 mg today as scheduled.  Tolerated it well without nausea or vomiting headache or chest pain or palpitations.  Her blood pressure was borderline but manageable. Has not seen any improvement so far.  Tolerating Seroquel.  Doesn't like Seroquel bc she thinks it flattens here.  We discussed her ambivalence about it. She is starting Auvelity and has tolerated it  the last 2 days without side effect.  She still does not feel like herself and feels flat and not enjoying things with suppressed expressed emotion  04/17/2022 appointment with the following noted: Received Spravato 84 mg today as scheduled.  Tolerated it well without nausea or vomiting headache or chest pain or palpitations.  Her blood pressure was borderline but manageable. Has not seen any improvement so far.  Tolerating Seroquel.  Doesn't like Seroquel bc she thinks it flattens her. She has been tolerating the Auvelity 1 in the morning without side effects for about a week.  She has not noticed significant improvement so far.  She still feels depressed and flat and not herself.  Other people notice that she is flat emotionally.  She is not suicidal.  She does feel discouraged that she is not getting better yet.  04/19/2022 appointment noted: Has increased Auvelity to 1 twice daily for 2 days, continues quetiapine XR 300 mg nightly, clonidine 0.3 mg twice daily, lorazepam 1 mg twice daily for anxiety and Adderall XR 20 mg in the morning. No obious SE but she still thinks quetiapine XR is making her feel down.  But not sedated Received Spravato 84 mg today as scheduled.  Tolerated it well without nausea or vomiting headache or chest pain or palpitations.  Her blood pressure was borderline but manageable. She still feels quite anxious and feels it necessary to take both the clonidine and lorazepam twice a day to manage her anxiety.  She has been consistently down and flat and not herself until yesterday afternoon she noted an improvement in mood and feeling much more like herself with her normal personality reemerging.  She was quite depressed in the morning with very dark negative thoughts.  She did not have those dark negative thoughts this morning.  She had a lot of questions about medication and when she was expecting to be improved and why she has not shown improvement up to now.  04/23/22 appt  noted: Has increased Auvelity to 1 twice daily for 1 week, continues quetiapine XR 300 mg nightly, clonidine 0.3 mg twice daily, lorazepam 1 mg twice daily for anxiety and Adderall XR 20 mg in the morning. No obious SE but she still thinks quetiapine XR is making her feel down.  But not sedated Received Spravato 84 mg today as scheduled.  Tolerated it well without nausea or vomiting headache or chest pain or palpitations.  She is still depressed but admits better function and is able to enjoy social interactions. Tolerating meds.  Would like to feel better for sure. Not herself.  Flat. Plan increase Auvelity to 1 tab BID as planned and reduce Quetiapine to 1/2 of ER 300 mg  bc NR for depression.  04/25/2022 appointment with the following noted: clonidine 0.3 mg twice daily, lorazepam 1 mg twice daily for anxiety and Adderall XR 20 mg in the morning. Seroquel XR 300 HS No obious SE but she still thinks quetiapine XR is making her feel down.  But not sedated Received Spravato 84 mg today as scheduled.  Tolerated it well without nausea or vomiting headache or chest pain or palpitations.  Called yesterday with more anxiety.  Had increased Auvelity for 1 day and reduced Seroquel XR for 1 day.  Felt restless and fearful  05/01/2022 appointment noted: clonidine 0.3 mg twice daily, lorazepam 1 mg twice daily for anxiety and Adderall XR 20 mg in the morning. Seroquel XR 150 HS, Auvelity 1 BID Received Spravato 84 mg today as scheduled.  Tolerated it well without nausea or vomiting headache or chest pain or palpitations.  Nurse has noted patient has called multiple times sometimes asking the same question repeatedly.  It is unclear whether she is truly forgetful or is just anxious seeking reassurance. Patient acknowledges ongoing depression as well as some anxiety but states she has felt a little better in the last couple of days.  She has reduced the Seroquel to 150 mg at night and has increased Auvelity to 1  twice daily but only for 1 day.  So far she seems to be tolerating it.  05/03/22 appt noted: clonidine 0.2 mg twice daily, lorazepam 1 mg twice daily for anxiety and Adderall XR 20 mg in the morning. Seroquel XR 150 HS, Auvelity 1 BID BP high this am about 170/100 and received extra clonidine 0.2 mg and came to receive Spravato.  Not dizzy, no SOB, nor CP but BP is still high Could not receive Spravato today bc BP high and pulse low at 30 ppm. Still depressed and anxious. Plan: continue trial Auvelity with Spravato She needs to get BP and pulse managed  05/08/22 TC: RTC  H Michael NA and mailbox full.  Could not leave message.  Pt  -  talked to she and H on speaker. H worried over wife.  Vacant stare.  Slurs words at times.  Not smiling. Reduced enjoyment.  Depression.  Withdrawn from usual activities.  Some irritability.  Anxious. Disc her concerns meds are making her worse.  Extensive discussion about her treatment resistant status.  There is a consistent pattern of not taking the medicines long enough to get benefit because she believes the meds are making her worse.  However the symptoms she describes as side effects are exactly the same symptoms that she had prior to taking the medication RX for  the depression.  So it is not clear that these are actual side effects. This is true about the 2 most recent meds including Seroquel and Auvelity.  Recommend psychiatric consultation in hopes of improving her comfort level with taking prescribed medications for a sufficient length of time to provide benefit. Extensive discussion about ECT is the treatment of choice for treatment resistant depression.  Spravato may work if she can comply with consistency.  There are medication options but they take longer to work.   Plan:  Reduce clonidine to 0.1 mg BID DT bradycardia.  Talk with PCP about BP and low pulse problems which are interfering with her consistent compliance with Spravato.   Limit lorazepam to  3 -4  mg daily max. Excess use is the cause of slurring speech.  She must stop excess use or will have to stop the med. Stop Auvelity per  her request.  But she has only been on the full dose for a little over a week and clearly has not had time to get benefit from it.  She thinks maybe it is making her more anxious. Reduce Seroquel from 150XR to 50 -100 mg at night IR.  She couldn't sleep when stopped it completely. Will not start new antidepressant until her SE issues are resolved or not. Get second psych opinion from Yehuda Budd MD or another psychiatrist.  H's sister is therapist in [place], MD, [facility]  05/16/2022 appointment with the following noted: Received Spravato 84 mg today as scheduled.  Tolerated it well without nausea or vomiting headache or chest pain or palpitations.  She stopped Auvelity as discussed last week. On her own, without physician input, she restarted Wellbutrin XL 450 mg every morning today.  She had taken it in the past.  She feels jittery and anxious. She feels less depressed than she did last week.  But she is still depressed without her usual range of affect.  She still is less social and less motivated than normal. Her primary care doctor increased the dose of losartan Plan: Stop Seroquel Reduce Wellbutrin XL to 300 mg every morning.  Starting the dose at 450 every morning is likely causing side effects of jitteriness and it should not be started at that have a dosage. Recommend she not change meds on her own without MDM put  05/23/2022 appointment with the following noted: Received Spravato 84 mg today as scheduled.  Tolerated it well without nausea or vomiting headache or chest pain or palpitations.  Has not dropped seroquel XR 300 mg 1/2 tablet nightly bc couldn't sleep without it. Has not tried lower dose quetiapine 50 mg HS Still feels depressed.   BP is better managed so far, just saw PCP.  BP is better today and infact is low today. Dropped  clonidine as directed from 0.3 mg BID bc inadequate control of BP to 0.2 mg BID.  However she wants to increase it back to 0.3 mg twice daily because she feels it helped her anxiety better.  Wonders about increasing Wellbutrin for depression.  However she has only been on 300 mg a day for a week.  She was on 450 mg daily in the past.  06/06/22 appt noted: Received Spravato 84 mg today as scheduled.  Tolerated it well without nausea or vomiting headache or chest pain or palpitations.  She is still depressed and anxious.  She wants to try to stop the Seroquel but cannot sleep without some of it.  She is taking lorazepam 1 mg 4 times daily and still having a lot of anxiety.  She wants to increase clonidine back to 0.3 mg twice daily.  She hopes for more improvement She recently went for a second psychiatric opinion as suggested the results of that are pending.  06/11/22 appt noted: Received Spravato 84 mg today as scheduled.  Tolerated it well without nausea or vomiting headache or chest pain or palpitations.  She is still depressed and anxious. Without much change.  Still hopeless, anhedonia, reduced inteterest and motivation.  Tolerating meds. Disc concerns Spravato is not hleping much. Plan: stop Seroquel and start olanzapine 10 mg HS for TRD and anxiety.  06/13/2022 appointment noted: Received Spravato 84 mg today as scheduled.  Tolerated it well without nausea or vomiting headache or chest pain or palpitations.  She is still depressed and anxious. Without much change.  Still hopeless, anhedonia, reduced inteterest  and motivation.  Tolerating meds. Disc concerns Spravato is not helping much as hoped but is improving a bit in the last week. Tolerating meds. Continues Wellbutrin XL 450 AM, tolerating recently started olanzapine  10 mg HS. Sleep is good.   Pending appt with [place] consult.  06/18/22 appt noted: Received Spravato 84 mg today as scheduled.  Tolerated it well without nausea or vomiting  headache or chest pain or palpitations.  Tolerating meds. Continues Wellbutrin XL 450 AM, tolerating recently started olanzapine  10 mg HS. Continues Adderall XR 20 amd and has tried to reduce lorazepam to '1mg'$  TID Sleep is good.   Pending appt with [place] consult. Depression is a little bit better in the last week with a little improvement in emotional expression and interest.  She is pushing herself to be more active.  Her daughter thought she was a little better than she has been.  However she is still depressed and still not her normal self with anhedonia and reduced emotional expressiveness.  06/20/22 appt noted: Received Spravato 84 mg today as scheduled.  Tolerated it well without nausea or vomiting headache or chest pain or palpitations.  Tolerating meds with a little sleepiness. Continues Wellbutrin XL 450 AM, tolerating recently started olanzapine  10 mg HS. Continues Adderall XR 20 amd and has tried to reduce lorazepam to '1mg'$  TID Sleep is good.   Mood is improving.  Better funciton.  Anxiety is better with olanzapine. Still not herself and depression not gone with some anhedonia and social avoidance and feeling overwhelmed.  8/14 2023 received Spravato 84 mg 06/27/2022 received Spravato Spravato 84 mg 07/02/2022 received Spravato 84 mg 07/04/2022 received Spravato 84 mg  07/09/2022 appointment noted: Received Spravato 84 mg today as scheduled.  Tolerated it well without nausea or vomiting headache or chest pain or palpitations.  Expected dissociation and feels less depressed with resolution of negative emotions immediately after Spravato and then depression, anxiety creep back in. Continues meds Adderall XR 20 mg every morning, Wellbutrin XL 450 every morning, clonidine 0.1 mg twice daily, lorazepam 1 mg every 6 hours as needed, olanzapine increased from 7.5 to 10 mg nightly on  Tolerating meds.  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  She does note some increased appetite and is somewhat concerned about that but has not gained significant amounts of weight. She has had the [place] consultation which was initially denied but she knows it can be appealed.  However because she is improving with Spravato plus the other medications now she wants to continue the current treatment plan.  07/18/22 appt noted: Continues meds Adderall XR 20 mg every morning, Wellbutrin XL 450 every morning, clonidine 0.1 mg twice daily, lorazepam 1 mg every 6 hours as needed, olanzapine increased from 7.5 to 10 mg nightly on 07/04/2022. Received Spravato 84 mg today as scheduled.  Tolerated it well without nausea or vomiting headache or chest pain or palpitations.  Expected dissociation and feels less depressed with resolution of negative emotions immediately after Spravato and then depression, anxiety creep back in. Continues meds Adderall XR 20 mg every morning, Wellbutrin XL 450 every morning, clonidine 0.1 mg twice daily, lorazepam 1 mg every 6 hours as needed, olanzapine increased from 7.5 to 10 mg nightly on  Tolerating meds.  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  She does note some increased appetite and is somewhat concerned about that but has not gained significant amounts of weight. She has had the [place] consultation which was initially denied but she knows it can be appealed. She continues to have chronic ambivalence about psychiatric medicines and initially tends to blame her depressive symptoms such as decreased concentration and feeling flat on what ever medicine she currently is taking even though she had the same symptoms before the current medicines were started.  Then after discussion she does admit that her depressive symptoms are improved since adding  olanzapine but still has those residual symptoms noted.  07/23/22 received Spravato 84 mg   07/30/2022 appointment noted: Received Spravato 84 mg today as scheduled.  Tolerated it well without nausea or vomiting headache or chest pain or palpitations.  Expected dissociation and feels less depressed with resolution of negative emotions immediately after Spravato and then depression, anxiety creep back in. Continues meds Adderall XR 20 mg every morning, Wellbutrin XL 450 every morning, clonidine 0.1 mg twice daily, lorazepam 1 mg every 6 hours as needed, olanzapine increased from 7.5 to 10 mg nightly on  She has been inconsistent with olanzapine because she continues to be ambivalent about the medications in general and thinks that perhaps the 10 mg is making her feel blunted.  She continues to feel some depression.  She had a good day this week and but still feels somewhat depressed and persistently anxious. Plan: be consistent with olanzapine 10 mg HS for TRD and longer trial for potential benefit for anxiety.  Has not taken it consistently.  08/06/2022 appointment noted: Received Spravato 84 mg today as scheduled.  Tolerated it well without nausea or vomiting headache or chest pain or palpitations.  Expected dissociation and feels less depressed with resolution of negative emotions immediately after Spravato and then depression, anxiety creep back in. Continues meds Adderall XR 20 mg every morning, Wellbutrin XL 450 every morning, clonidine 0.1 mg twice daily, lorazepam 1 mg every 6 hours as needed, olanzapine i 10 mg nightly  She continues to feel depressed but is about 50% better with Spravato.  She is still not herself.  She still has anhedonia.  She still is not her able to engage socially in the typical ways.  She is not jovial and outgoing like normal.  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  She continues to feel that her personality is dampened down.  There is a question about whether it is related to depression or medication. Plan: continue olanzapine 10 for longer trial for TRD and severe anxiety.  08/13/22 appt noted:  Received Spravato 84 mg today as scheduled.  Tolerated it well without nausea or vomiting headache or chest pain or palpitations.  Expected dissociation and feels less depressed with resolution of negative emotions immediately after Spravato and then depression, anxiety creep back in. Continues meds Adderall XR 20 mg every morning, Wellbutrin XL 450 every morning, clonidine 0.1 mg twice daily, lorazepam 1 mg every 6 hours as needed, olanzapine i 10 mg nightly  She still does not feel herself.  Still struggles with depression and low motivation and reduced social engagement and reduced interest and reduced emotional expression.  She is somewhat better with the medicines plus Spravato.  She still believes the Spravato makes her blunted and  is not sure how much it helps her anxiety.  She can have good days when her family is around and she is engaged.  She still wants to stop the olanzapine. She has apparently continued to take the trazodone despite having been told to stop it when she started olanzapine.  She feels like she needs the trazodone. Plan: DC olanzapine and Start nortriptyline 25 mg nightly and build up to 75 mg nightly and then check blood level.    08/27/2022 appointment noted: Received Spravato 84 mg today as scheduled.  Tolerated it well without nausea or vomiting headache or chest pain or palpitations.  Expected dissociation and feels less depressed with resolution of negative emotions immediately after Spravato and then depression, anxiety creep back in. Continues meds Adderall XR 20 mg every morning, Wellbutrin XL 450 every morning, clonidine 0.1 mg twice daily, lorazepam 1 mg every 6 hours as needed. Stopped olanzapine and started  nortriptyline which she has taken for about a week is 75 mg nightly. So far she is tolerating the nortriptyline well with the exception of some dry mouth and constipation which she is working to manage.  She does not feel substantially better better or different off the olanzapine.  No change in her sleep which is good.  Main concern currently in addition to the residual depression is anxiety which is somewhat situational with pending arch show.  She is worrying about it more than normal.  Says she is having to take lorazepam twice a day where she had been able to keep reduce it prior to this.  She still does not feel like herself with residual depression with less social interest and less of her usual buoyancy in personality.  She is flatter than normal.  Overall she still feels that the Spravato has been helpful at reducing the severity of the depression.  She is not suicidal. She has not heard anything about the [place] appeal as of yet.  09/05/2022 appointment noted: Received Spravato 84 mg today as scheduled.  Tolerated it well without nausea or vomiting headache or chest pain or palpitations.  Expected dissociation and feels less depressed with resolution of negative emotions immediately after Spravato and then depression, anxiety creep back in. Continues meds Adderall XR 20 mg every morning, Wellbutrin XL 450 every morning, clonidine 0.1 mg twice daily, lorazepam 1 mg every 6 hours as needed. Stopped olanzapine and started nortriptyline which she has taken for about 2 week is 75 mg nightly. Initially blood pressure was a little high causing delay in starting Spravato.  She admitted to feeling a little wound up.  She still experiences a little increase in depression if she goes longer than a week in between doses of Spravato.  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  09/10/22 appt noted: Received  Spravato 84 mg today as scheduled.  Tolerated it well without nausea or vomiting headache or chest pain or palpitations.  Expected dissociation gradually resolved over the 2 hour observation period. She feels 50% less depressed with Spravto and wants to continue it.   Continues meds Adderall XR 20 mg every morning, Wellbutrin XL 450 every morning, clonidine 0.1 mg twice daily, lorazepam 1 mg every 6 hours as needed. Has started nortriptyline 75 mg nightly for about 3 weeks. Has not seen a significant difference with the addition of nortriptyline.  Tolerating it pretty well. She continues  to have some degree of anhedonia and significant depression and anxiety.  Her daughters noticed that she is more needy and calls more frequently.  She acknowledges this as well.  She is clearly still not herself. Plan: pramipexole off label and RX 0.25 mg BID  09/17/2022 appointment noted: Received Spravato 84 mg today as scheduled.  Tolerated it well without nausea or vomiting headache or chest pain or palpitations.  Expected dissociation gradually resolved over the 2 hour observation period. She feels 50% less depressed with Spravto and wants to continue it.   Continues meds Adderall XR 20 mg every morning, Wellbutrin XL 450 every morning, clonidine 0.1 mg twice daily, lorazepam 1 mg every 6 hours as needed. Has started nortriptyline 75 mg nightly for about 3 weeks and DT level 176, reduced to 50 mg HS early November. Still the same sx as noted last visit.  Tolerating meds.   Compliant.  Still depressed and family notices.  Has been able to participate in family interactions.  Some post-show let down and has to do detailed work which is hard for her bc ADD.  Sleep and eating well.  Energy OK but not great.  No SI.  Not cried in a year or so.  Clearly less depressed and hopeless than before the Spravato.  09/24/22 appt noted: Received Spravato 84 mg today as scheduled.  Tolerated it well without nausea or vomiting  headache or chest pain or palpitations.  Expected dissociation gradually resolved over the 2 hour observation period. She feels 50% less depressed with Spravto and wants to continue it.   Continues meds Adderall XR 20 mg every morning, Wellbutrin XL 450 every morning, clonidine 0.1 mg twice daily, lorazepam 1 mg every 6 hours as needed. Has started nortriptyline 75 mg nightly for about 3 weeks and DT level 176, reduced to 50 mg HS early November. Still the same sx as noted last visit.  Tolerating meds.   Compliant.  Still depressed and family notices.  Has been able to participate in family interactions.  Some post-show let down and has to do detailed work which is hard for her bc ADD.  Sleep and eating well.  Energy OK but not great.  No SI.  Not cried in a year or so.  Clearly less depressed and hopeless than before the Spravato. Is not making further progress generally.  Stuck with moderate depression  10/02/22 appt noted: Received Spravato 84 mg today as scheduled.  Tolerated it well without nausea or vomiting headache or chest pain or palpitations.  Expected dissociation gradually resolved over the 2 hour observation period. She feels 50% less depressed with Spravto and wants to continue it.   Continues meds Adderall XR 20 mg every morning, Wellbutrin XL 450 every morning, clonidine 0.1 mg twice daily, lorazepam 1 mg every 6 hours as needed. Has started nortriptyline 75 mg nightly for about 3 weeks and DT level 176, reduced to 50 mg HS early November. Still the same sx as noted last visit.  Tolerating meds.   Compliant.  Still depressed and family notices.  Has been able to participate in family interactions.  Some post-show let down and has to do detailed work which is hard for her bc ADD.  Sleep and eating well.  Energy OK but not great.  No SI.  Not cried in a year or so.  Clearly less depressed and hopeless than before the Spravato. Is not making further progress generally.  Stuck with moderate  depression.  Is  able to function pretty normally. Plan: trial pramipexole 0.25 mg BID off label for depression.   10/08/22 appt noted: Received Spravato 84 mg today as scheduled.  Tolerated it well without nausea or vomiting headache or chest pain or palpitations.  Expected dissociation gradually resolved over the 2 hour observation period. She feels 50% less depressed with Spravto and wants to continue it.   Continues meds Adderall XR 20 mg every morning, Wellbutrin XL 450 every morning, clonidine 0.1 mg twice daily, lorazepam 1 mg every 6 hours as needed. Has started nortriptyline 75 mg nightly for about 3 weeks and DT level 176, reduced to 50 mg HS early November. Still the same sx as noted last visit.  Tolerating meds.   Compliant.  Still depressed and family notices.  Has been able to participate in family interactions.  Some post-show let down and has to do detailed work which is hard for her bc ADD.  Sleep and eating well.  Energy OK but not great.  No SI.  Not cried in a year or so.  Clearly less depressed and hopeless than before the Spravato. Is not making further progress generally.  Stuck with moderate depression.  Behaved and felt pretty normally with family over for Thanksgiving. Doesn't see benefit or SE with pramipexole but thinks maybe it makes her worse. Plan:  increase pramipexole augmentation off label to 0.5 mg BID  10/15/2022 appointment noted: Received Spravato 84 mg today as scheduled.  Tolerated it well without nausea or vomiting headache or chest pain or palpitations.  Expected dissociation gradually resolved over the 2 hour observation period. She feels 50% less depressed with Spravto and wants to continue it.   Continues meds Adderall XR 20 mg every morning, Wellbutrin XL 450 every morning, clonidine 0.2 mg twice daily, lorazepam 1 mg every 6 hours as needed.  Trazodone 50 mg tablets 1-2 nightly as needed insomnia Has started nortriptyline 75 mg nightly for about 3 weeks  and DT level 176, reduced to 50 mg HS early November. Recommended increase pramipexole 0.5 mg twice daily off label for treatment resistant depression on 10/08/2022. She feels better motivated more active with pramipexole 0.5 mg twice daily.  She still is depressed but it is better.  We discussed the possibility of going up in the dose but did not change it.  10/22/2022 appointment noted: Received Spravato 84 mg today as scheduled.  Tolerated it well without nausea or vomiting headache or chest pain or palpitations.  Expected dissociation gradually resolved over the 2 hour observation period. She feels 50% less depressed with Spravto and wants to continue it.   Continues meds Adderall XR 20 mg every morning, Wellbutrin XL 450 every morning, clonidine 0.2 mg twice daily, lorazepam 1 mg every 8 hours as needed.  Trazodone 50 mg tablets 1-2 nightly as needed insomnia Has started nortriptyline 75 mg nightly for about 3 weeks and DT level 176, reduced to 50 mg HS early November. pramipexole 0.5 mg twice daily off label for treatment resistant depression on 10/08/2022. She is better motivated than she was.  She is journaling 3 pages a day.  She has started walking and has walked 5 days a week for 50 minutes for the last 2 weeks.  That is significantly helped her mood.  Her mood tends to be better when she interacts with family.  However she still has some periods of depression.  She is tolerating the medications well.  She still feels like her affect and confidence is not  back to normal. Plan:  increase pramipexole augmentation off label to 0.5 mg tID or 0.75 mg BID   10/29/22 appt noted: Received Spravato 84 mg today as scheduled.  Tolerated it well without nausea or vomiting headache or chest pain or palpitations.  Expected dissociation gradually resolved over the 2 hour observation period. She feels 50% less depressed with Spravto and wants to continue it.   Continues meds Adderall XR 20 mg every  morning, Wellbutrin XL 450 every morning, clonidine 0.2 mg twice daily, lorazepam 1 mg every 8 hours as needed.  Trazodone 50 mg tablets 1-2 nightly as needed insomnia Has started nortriptyline 75 mg nightly for about 3 weeks and DT level 176, reduced to 50 mg HS early November. pramipexole increased to 0.75 mg twice daily off label for treatment resistant depression  She has been feeling somewhat better with the increase in pramipexole and is tolerating it well.  She still is easily overwhelmed.  Her affect and mood can improve now when around her family or doing something positive.  She has been able to be more productive. She is tolerating the current medications. She is still considering TMS as an alternative to Spravato.  11/15/2022 appointment noted: Received Spravato 84 mg today as scheduled.  Tolerated it well without nausea or vomiting headache or chest pain or palpitations.  Expected dissociation gradually resolved over the 2 hour observation period. She feels 50% less depressed with Spravto and wants to continue it.   Continues meds Adderall XR 20 mg every morning, Wellbutrin XL 450 every morning, clonidine 0.2 mg twice daily, lorazepam 1 mg every 8 hours as needed.  Trazodone 50 mg tablets 1-2 nightly as needed insomnia Has started nortriptyline 75 mg nightly for about 3 weeks and DT level 176, reduced to 50 mg HS early November. pramipexole increased to 0.75 mg twice daily off label for treatment resistant depression  He has noticed some increase in depression due to the length of time since the last Spravato administration.  She believes Spravato is helping her.  sHe is not suicidal but has felt very blue the last few days. She is tolerating the medication. She is ambivalent about Spravato versus [place] but she is considering [place]. She wants to continue Spravato administration because it is clearly helpful.  11/19/22 appt noted: Received Spravato 84 mg today as scheduled.  Tolerated it well  without nausea or vomiting headache or chest pain or palpitations.  Expected dissociation gradually resolved over the 2 hour observation period. She feels 50% less depressed with Spravto and wants to continue it.   Continues meds Adderall XR 20 mg every morning, Wellbutrin XL 450 every morning, clonidine 0.2 mg twice daily, lorazepam 1 mg every 8 hours as needed.  Trazodone 50 mg tablets 1-2 nightly as needed insomnia Has started nortriptyline 75 mg nightly for about 3 weeks and DT level 176, reduced to 50 mg HS early November. pramipexole increased to 0.75 mg twice daily off label for treatment resistant depression  She has felt better back on Spravato more regularly.  However she still has residual depression esp when alone or when wihtout activity.  Can function when needed.   Does not have her connfidence back. She plans to pursue [place] availability. Have discussed retrying [place]  11/28/22 appt noted: Received Spravato 84 mg today as scheduled.  Tolerated it well without nausea or vomiting headache or chest pain or palpitations.  Expected dissociation gradually resolved over the 2 hour observation period. She feels 50% less  depressed with Spravto and wants to continue it.   Continues meds Adderall XR 20 mg every morning, Wellbutrin XL 450 every morning, clonidine 0.2 mg twice daily, lorazepam 1 mg every 8 hours as needed.  Trazodone 50 mg tablets 1-2 nightly as needed insomnia Has started nortriptyline 75 mg nightly for about 3 weeks and DT level 176, reduced to 50 mg HS early November. pramipexole increased to 0.75 mg twice daily off label for treatment resistant depression  Last week she was more depressed than usual for reasons that are not clear.  She has not had a clear answer from [facility] [place] about [facility] options.  States that they have not returned her call.  She is asked about Auvelity retry in place of Wellbutrin.  Has still been walking.  12/03/22 appt noted: Received Spravato 84 mg today  as scheduled.  Tolerated it well without nausea or vomiting headache or chest pain or palpitations.  Expected dissociation gradually resolved over the 2 hour observation period. She feels 50% less depressed with Spravto and wants to continue it.   Continues meds Adderall XR 20 mg every morning, Wellbutrin XL 450 every morning, clonidine 0.2 mg twice daily, lorazepam 1 mg every 8 hours as needed.  Trazodone 50 mg tablets 1-2 nightly as needed insomnia started nortriptyline 75 mg nightly for about 3 weeks and DT level 176, reduced to 50 mg HS early November. pramipexole increased to 0.75 mg twice daily off label for treatment resistant depression  No SE .  Satisfied with meds. Depression is better in the last week.  Not sure why that is the case.  Still walking daily and that helps and journaling 3 pages daily.  Working on [employer], academic.  No changes desire.  Clearly benefits from Spravato but not 100%.  Still considering TMS.  12/10/22 appt noted: Received Spravato 84 mg today as scheduled.  Tolerated it well without nausea or vomiting headache or chest pain or palpitations.  Expected dissociation gradually resolved over the 2 hour observation period. She feels 50% less depressed with Spravto and wants to continue it.   Continues meds Adderall XR 20 mg every morning, Wellbutrin XL 450 every morning, clonidine 0.2 mg twice daily, lorazepam 1 mg every 8 hours as needed.  Trazodone 50 mg tablets 1-2 nightly as needed insomnia started nortriptyline 75 mg nightly for about 3 weeks and DT level 176, reduced to 50 mg HS early November. pramipexole increased to 0.75 mg twice daily off label for treatment resistant depression  No SE .   Depression was worse this week for no apparent reason.  She struggled being positive.  She has felt more discouraged.  She has felt more anxious.  She has had a hard time doing tasks.  She is interested in retrying the [place] which we had discussed previously. Plan: She agrees to  [facility].  To improve tolerability and reduce risk of side effects, Stop Wellbutrin and start Auvelity 1 in the morning for 1 week then 1 twice daily AndReduce pramipexole 0.5 mg BID and reduce nortriptyline to 50 mg HS  12/17/22 appt noted: Received Spravato 84 mg today as scheduled.  Tolerated it well without nausea or vomiting headache or chest pain or palpitations.  Expected dissociation gradually resolved over the 2 hour observation period. She feels 50% less depressed with Spravto and wants to continue it.   Continues meds Adderall XR 20 mg every morning, stopped Wellbutrin XL 150 every morning, clonidine 0.2 mg twice daily, lorazepam 1 mg every  8 hours as needed.  Trazodone 50 mg tablets 1-2 nightly as needed insomnia Has started nortriptyline 75 mg nightly for about 3 weeks and DT level 176, reduced to 50 mg HS early November. pramipexole 0.375 mg twice daily off label for treatment resistant depression with plan to stop Started Auvelity 1 AM Still depressed to moderate degree.  Tolerating Auvelity so far.  No other problems with meds.  Willing to give Auvelity a chance. Plan: She agrees to [facility].  Continue 1 AM and when tolerated then increase to BID Stop pramipexole.  12/26/22 received Spravato  01/01/23 appt noted: Received Spravato 84 mg today as scheduled.  Tolerated it well without nausea or vomiting headache or chest pain or palpitations.  Expected dissociation gradually resolved over the 2 hour observation period. She feels 50% less depressed with Spravto and wants to continue it.   Continues meds Adderall XR 20 mg every morning, stopped Wellbutrin XL 150 every morning, clonidine 0.2 mg twice daily, lorazepam 1 mg every 8 hours as needed.  Trazodone 50 mg tablets 1-2 nightly as needed insomnia Has started nortriptyline 75 mg nightly for about 3 weeks and DT level 176, reduced to 50 mg HS early November. Forgot to reduce pramipexole stoll taking  0.5 mg twice daily off  label for treatment resistant depression  Started Auvelity 1 AM & PM last week. Occ misses Spravato DT htn.this week a better than last.   Painting again more and it helps mood.  Confidence still low and not as likely to socialize as normal but enjoys family.   Still ambivalent about [place]. Tolerating meds.  Including the increase in [place].    01/08/23 appt noted: Received Spravato 84 mg today as scheduled.  Tolerated it well without nausea or vomiting headache or chest pain or palpitations.  Expected dissociation gradually resolved over the 2 hour observation period. She feels 50% less depressed with Spravto and wants to continue it.   Continues meds Adderall XR 20 mg every morning, stopped Wellbutrin XL 150 every morning, clonidine 0.2 mg twice daily, lorazepam 1 mg every 8 hours as needed.  Trazodone 50 mg tablets 1-2 nightly as needed insomnia Has started nortriptyline 75 mg nightly for about 3 weeks and DT level 176, reduced to 50 mg HS early November. reduced pramipexole to 0.5 mg daily off label for treatment resistant depression  Started Auvelity 1 AM & PM mid Feb. More depressed as week progresses.  Thinks it is worse with less pramipexole.  More negative.  Able to function but feels miserable.  No SI.  Hopeless.  Sleep ok.  Tolerating meds.   Talked to [facility] about [place] and appt mid March. Plan: increase pramipexole to 0.5 mg TID for dep bc seemed to worsen with the reduction.  01/15/23 appt noted: Received Spravato 84 mg today as scheduled.  Tolerated it well without nausea or vomiting headache or chest pain or palpitations.  Expected dissociation gradually resolved over the 2 hour observation period. She feels 50% less depressed with Spravto and wants to continue it.   Continues meds Adderall XR 20 mg every morning, stopped Wellbutrin XL 150 every morning, clonidine 0.2 mg twice daily, lorazepam 1 mg every 8 hours as needed.  Trazodone 50 mg tablets 1-2 nightly as needed  insomnia Has started nortriptyline 75 mg nightly for about 3 weeks and DT level 176, reduced to 50 mg HS early November. Increased pramipexole to 0.5 mg  to TID  off label for treatment resistant depression  Started Auvelity 1 AM & PM mid Feb. markedly better depression the increase in pramipexole.  She feels like her depression is almost resolved.  She is very pleased with the response.  She is tolerating the medications well  01/30/23 appt noted: Received Spravato 84 mg today as scheduled.  Tolerated it well without nausea or vomiting headache or chest pain or palpitations.  Expected dissociation gradually resolved over the 2 hour observation period. She feels 50% less depressed with Spravto and wants to continue it.   Continues meds Adderall XR 20 mg every morning, stopped Wellbutrin XL 150 every morning, clonidine 0.2 mg twice daily, lorazepam 1 mg every 8 hours as needed.  Trazodone 50 mg tablets 1-2 nightly as needed insomnia Has started nortriptyline 75 mg nightly for about 3 weeks and DT level 176, reduced to 50 mg HS early November. Increased pramipexole to 0.5 mg  to TID  off label for treatment resistant depression  Started Auvelity 1 AM & PM mid Feb. Mood markedly better with pramipexole added.  Nearly normal mood now with minimal depression.  More social and outgoing and motivated and resolved anhedonia. No SE.  Compliant.   ECT-MADRS    Flowsheet Row Clinical Support from 01/08/2023 in [facility] from 08/06/2022 in [facility] from 07/04/2022 in [facility] from 05/21/2022 in [facility] Office Visit from 03/02/2022 in [facility]  MADRS Total Score 21 29 15 27  46        Past Psychiatric Medication Trials: fluoxetine, duloxetine, Viibryd, Pristiq, sertraline, citalopram,  Trintellix  anxious and SI Wellbutrin XL 450 Auvelity 1 dose nortriptyline 75 mg nightly for about 3 weeks and DT level 176, reduced to 50 mg HS NR. Pramipexole 1 mg NR Adderall, Adderall XR, Vyvanse, Ritalin, Strattera low dose NR Lorazepam Trazodone  Depakote,  lamotrigine cog complaints Lithium remotely Abilify 7.5  Vraylar 1.5 mg daily agitation and insomnia Rexulti insomnia Latuda 40 one dose, CO anxious and SI Seroquel XR 300 Olanzapine 10  At visit November 12, 2019. We discussed Patient developed an increasingly severe alcohol dependence problem since her last visit in January.  She went to [facility] and has had no alcohol since then except 1 day.  She never abused stimulants but they took her off the stimulants at [facility].  Her ADD was markedly worse.  The Wellbutrin did not help the ADD.   D history lamotrigine rash at 67 yo  Review of Systems:  Review of Systems  Constitutional:  Negative for fatigue.  HENT:  Negative for congestion.   Musculoskeletal:  Positive for back pain. Negative for arthralgias and joint swelling.       SP hip surgery October 2020  Neurological:  Negative for dizziness and tremors.  Psychiatric/Behavioral:  Positive for decreased concentration. Negative for agitation, behavioral problems, confusion, dysphoric mood, hallucinations, self-injury, sleep disturbance and suicidal ideas. The patient is nervous/anxious. The patient is not hyperactive.        Forgetful at times about med recommendations.  Send present for him The only [place] she notices a it states it is unbelievable him how how much are her 3 steps around freedom of the present fall and just in the past decade  Medications: I have reviewed the patient's current medications.  Current Outpatient Medications  Medication Sig Dispense Refill   amLODipine (NORVASC) 2.5 MG tablet Take 2.5 mg by mouth  daily.     amphetamine-dextroamphetamine (ADDERALL XR) 20 MG 24 hr capsule Take 1 capsule  (20 mg total) by mouth every morning. 30 capsule 0   amphetamine-dextroamphetamine (ADDERALL XR) 20 MG 24 hr capsule Take 1 capsule (20 mg total) by mouth every morning. 30 capsule 0   buPROPion (WELLBUTRIN XL) 150 MG 24 hr tablet TAKE 3 TABLETS BY MOUTH DAILY (Patient taking differently: Take 150 mg by mouth daily.) 270 tablet 1   cloNIDine (CATAPRES) 0.2 MG tablet TAKE 1 TABLET BY MOUTH 2 TIMES DAILY. 180 tablet 0   Dextromethorphan-buPROPion ER (AUVELITY) 45-105 MG TBCR Take 1 tablet by mouth 2 (two) times daily. 60 tablet 1   Esketamine HCl, 84 MG Dose, (SPRAVATO, 84 MG DOSE,) 28 MG/DEVICE SOPK USE 3 SPRAYS IN EACH NOSTRIL ONCE A WEEK 3 each 1   iron polysaccharides (NIFEREX) 150 MG capsule TAKE 1 CAPSULE BY MOUTH EVERY DAY 90 capsule 1   losartan (COZAAR) 50 MG tablet Take 50 mg by mouth daily.     nebivolol (BYSTOLIC) 2.5 MG tablet Take 2.5 mg by mouth daily.     nortriptyline (PAMELOR) 25 MG capsule Take 2 capsules (50 mg total) by mouth at bedtime. 180 capsule 0   pramipexole (MIRAPEX) 0.5 MG tablet Take 1 tablet (0.5 mg total) by mouth 3 (three) times daily. 90 tablet 0   traZODone (DESYREL) 50 MG tablet TAKE 1-2 TABLETS BY MOUTH NIGHTLY AS NEEDED FOR SLEEP 180 tablet 1   LORazepam (ATIVAN) 1 MG tablet Take 1 tablet (1 mg total) by mouth every 8 (eight) hours as needed. for anxiety 90 tablet 1   No current facility-administered medications for this visit.    Medication Side Effects: None  Allergies:  Allergies  Allergen Reactions   Metronidazole Shortness Of Breath and Other (See Comments)    Heart pounding   Ferrlecit [Na Ferric Gluc Cplx In Sucrose] Other (See Comments)    Infusion reaction 05/12/2019    Past Medical History:  Diagnosis Date   ADHD    Anemia    Anxiety    Arthritis    Depression    Heart murmur    i went to see a cardiologit slast eyar  and i had zero plaque,    PONV (postoperative nausea and vomiting)    Recovering alcoholic in remission ([place])      Family History  Problem Relation Age of Onset   Atrial fibrillation Mother    CAD Father     Past Medical History, Surgical history, Social history, and Family history were reviewed and updated as appropriate.   Please see review of systems for further details on the patient's review from today.   Objective:   Physical Exam:  There were no vitals taken for this visit.  Physical Exam Constitutional:      General: She is not in acute distress. Neurological:     Mental Status: She is alert and oriented to person, place, and time.     Coordination: Coordination normal.     Gait: Gait normal.  Psychiatric:        Attention and Perception: Attention and perception normal.        Mood and Affect: Mood is anxious. Mood is not depressed. Affect is not labile or tearful.        Speech: Speech is not rapid and pressured, slurred or tangential.        Behavior: Behavior is not slowed.        Thought Content: Thought  content is not paranoid or delusional. Thought content does not include homicidal or suicidal ideation. Thought content does not include suicidal plan.        Cognition and Memory: Cognition normal. Memory is not impaired. She does not exhibit impaired recent memory.        Judgment: Judgment normal.     Comments: Insight intact. No auditory or visual hallucinations. No delusions.  Depression resolved with pramipexole TID     Lab Review:     Component Value Date/Time   NA 137 01/12/2021 1430   NA 140 11/18/2018 1544   K 3.8 01/12/2021 1430   CL 108 01/12/2021 1430   CO2 22 01/12/2021 1430   GLUCOSE 94 01/12/2021 1430   BUN 14 01/12/2021 1430   BUN 20 11/18/2018 1544   CREATININE 0.82 01/12/2021 1430   CALCIUM 8.9 01/12/2021 1430   PROT 6.6 01/12/2021 1430   ALBUMIN 3.9 01/12/2021 1430   AST 12 (L) 01/12/2021 1430   ALT 11 01/12/2021 1430   ALKPHOS 46 01/12/2021 1430   BILITOT 0.5 01/12/2021 1430   GFRNONAA >60 01/12/2021 1430   GFRAA >60 09/02/2019 0249    GFRAA >60 01/27/2019 0811       Component Value Date/Time   WBC 4.5 01/12/2021 1430   RBC 4.32 01/12/2021 1430   HGB 12.8 01/12/2021 1430   HGB 12.9 07/17/2019 0953   HCT 38.5 01/12/2021 1430   HCT 21.9 (L) 12/25/2018 1221   PLT 272 01/12/2021 1430   PLT 286 07/17/2019 0953   MCV 89.1 01/12/2021 1430   MCH 29.6 01/12/2021 1430   MCHC 33.2 01/12/2021 1430   RDW 12.4 01/12/2021 1430   LYMPHSABS 1.4 01/12/2021 1430   MONOABS 0.4 01/12/2021 1430   EOSABS 0.0 01/12/2021 1430   BASOSABS 0.0 01/12/2021 1430    No results found for: "POCLITH", "LITHIUM"   No results found for: "PHENYTOIN", "PHENOBARB", "VALPROATE", "CBMZ"   .res Assessment: Plan:    Recurrent major depression resistant to treatment ([place])  Generalized anxiety disorder - Plan: LORazepam (ATIVAN) 1 MG tablet  Attention deficit hyperactivity disorder (ADHD), predominantly inattentive type  Insomnia due to mental condition - Plan: LORazepam (ATIVAN) 1 MG tablet  Accelerated hypertension   She has treatment resistant major depression ongoing with 50% better with Spravato. Have  discussed some of her  abnormal behaviors last year leading to this depressive episode getting worse which she says were associated with heavy use of delta 8 and not a manic episode.  She realizes now that that was not good for her.  She stopped all use of other drugs including those available over-the-counter such as delta 8 or any other THC related products.  She is no longer having any of those types of behaviors and instead is depressed.  She is experiencing more pleasure and is less blunted and enjoying more and better inteest versus before the Spravato.  She also feels worse if misses a dose of Spravato.  Depression resolved 2 weeks ago, after increasing pramipexole 0.5 mg TID  Patient was administered Spravato 84 mg intranasally today.  The patient experienced the typical dissociation which gradually resolved over the 2-hour period  of observation.  There were no complications.  Specifically the patient did not have nausea or vomiting or headache.  Blood pressures monitored at the 40-minute and 2-hour follow-up intervals.  Borderline high.  By the time the 2-hour observation period was met the patient was alert and oriented and able to exit without assistance.  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  See nursing note for further details.She wants to continue Spravato. We discussed discussed the side effects in detail as well as the protocol required to receive Spravato.   Failed multiple antidepressants.  Many of them were not actual failures but intolerances and it is unclear whether some of that was more connected with anxiety than true side effects.  1 example is the [place].  In general she does not want to try anything but an antidepressant but has failed all major categories of antidepressants except TCAs and MAO inhibitors which have not been tried until now.  Started nortriptyline 75 mg nightly.  Serum level 176.  So Reduced to 50 mg HS  Discussed side effects in detail.  Needs more time to help. Extensive discussion previously about her ambivalence about meds and missatributing sx of depression as SE of meds.    She is tolerating Auvelity.  Continue 1  BID for at least 2 more weeks.  Worse with less pramipexole. Increased to 0.5 mg TID on about end of Feb 2024 and markedly better Dosing range off label for depression ranges from 1-5 mg daily.  Disc risk compulsive impulsive behavior and manis.    Started Spravato 84 mg twice weekly on 03/16/2022.  Now on weekly administration  Adderall  XR 20 mg AM   Ativan 1 mg 3 times daily as needed anxiety but try to cut it back. Is not ideal to use benzodiazepine with stimulant but because of the severity of her symptoms it has been necessary.  Hope to eventually eliminate the  benzodiazepine.  Expected as her depression improves her anxiety will improve as well.  However lately her anxiety has been unmanageable.  We will expect that to improve as the depression improves.  She has headed insturctions to reduce this.  Continue clonidine 0.2 mg BID off label for anxiety and helps BP partially. BP is better controlled but not consistent.  Consider increasing amlodipine.  Also on losartan 50  Discussed potential benefits, risks, and side effects of stimulants with patient to include increased heart rate, palpitations, insomnia, increased anxiety, increased irritability, or decreased appetite.  Instructed patient to contact office if experiencing any significant tolerability issues. She wants to return to usual dose of Adderall for ADD bc of mor poor cognitive function with reduction.  Also discussed that depression will impair cognitive function.  [place] consultation was initially denied .  She says she called [facility] about this but they have not scheduled an appt .Marland Kitchen   Consider consultation with [facility] if [facility] is not an option.  We discussed the pros and cons of this versus Spravato.  For now she wants to continue Spravato because it is clearly helpful.  Extensive discussion about her chronic ambivalence about psychiatric medicines and tendency to blame medicine for the symptoms of depression that she had prior to even starting the medications.  After discussing this at length she was able to acknowledge that that is factual.  She does not appear to be having any significant side effects with the current medications.  However so far not much benefit with nortriptyline despite adequate level.  Disc risk pllypharmacy  Rec keep track of BP and discuss with PCP. It has been up and down.  Sometimes needs extra clonidine before Spravato  Has Maintained sobriety  FU with Spravato weekly   Lynder Parents, MD, DFAPA  Please see After Visit Summary for patient specific  instructions.  No future appointments.                 No orders of the defined types were placed in this encounter.      -------------------------------

## 2023-02-02 NOTE — Progress Notes (Signed)
NURSES NOTE:   Patient arrived for her weekly 84mg  Spravato treatment. Pt is being treated for Treatment Resistant Depression, pt will be receiving 84 mg which will continue to be her maintenance dose, she receives weekly treatments.  Patient arrived and taken to treatment room. Confirmed she had a ride home which is her husband would be coming back to pick up pt when done and sometimes she needs to use [facility] Popper if he is unable to pick her up. Pt's Spravato is ordered through [employer] and delivered to office, all Spravato medication is stored at doctors office per REMS/FDA guidelines. The medication is required to be locked behind two doors per FDA/REMS Protocol. Medication is also disposed of properly per regulations.   Began taking patient's vital signs at 1:15 PM 117/85, pulse 87. Pulse Ox 99%. Gave patient first dose 28 mg nasal spray, each nasal spray administered in each nostril as directed and waited 5 minutes between the second and third dose. All 3 doses given pt did not complain of any nausea/vomiting, given a cup of water due to the taste after the administration of Spravato.  She listens to [employer] with spa or relaxing music.  Checked 40 minute vitals at 3:00 PM, 119/66, pulse 75, Pulse Ox 92%. Explained she would be monitored for a total time of 120 minutes. Discharge vitals were taken at 4:05 PM 114/73 P 72, 99% Pulse Ox. Dr. Clovis Pu met with pt today and discussed her medication. I walked pt to elevator, where her husband met her for the ride home. Recommend she go home and sleep or just relax on the couch. No driving, no intense activities. Verbalized understanding. Nurse was with pt a total of 70 minutes for clinical. Pt will let me know what day she is coming next week. Pt instructed to call office with any problems or questions.      LOT [id number] EXP JAN 2027

## 2023-02-03 NOTE — Progress Notes (Signed)
NURSES NOTE:   Patient arrived for her weekly 84mg  Spravato treatment. Pt is being treated for Treatment Resistant Depression this is #52 treatment, pt will be receiving 84 mg which will continue to be her maintenance dose, she receives weekly treatments.  Patient arrived and taken to treatment room. Confirmed she had a ride home which is her husband would be coming back to pick up pt when done and sometimes she needs to use [facility] Po[facility] if he is unable to pick her up. Pt's Spravato is ordered through [employer] and delivered to office, all Spravato medication is stored at doctors office per REMS/FDA guidelines. The medication is required to be locked behind two doors per FDA/REMS Protocol. Medication is also disposed of properly per regulations.   Began taking patient's vital signs at 2:00 PM 120/90, pulse 73. Pulse Ox 99%. Gave patient first dose 28 mg nasal spray, each nasal spray administered in each nostril as directed and waited 5 minutes between the second and third dose. All 3 doses given pt did not complain of any nausea/vomiting, given a cup of water due to the taste after the administration of Spravato.  She listens to [employer] with spa or relaxing music.  Checked 40 minute vitals at 2:40 PM, 123/87, pulse 82, Pulse Ox 92%. Explained she would be monitored for a total time of 120 minutes. Discharge vitals were taken at 4:05 PM 118/75 P 83, 99% Pulse Ox. Dr. Clovis Pu met with pt today and discussed her medication. I walked pt to elevator, where her husband met her for the ride home. Recommend she go home and sleep or just relax on the couch. No driving, no intense activities. Verbalized understanding. Nurse was with pt a total of 70 minutes for clinical. Pt will let me know what day she is coming next week. Pt instructed to call office with any problems or questions.      LOT [id number] EXP [id number]

## 2023-02-04 ENCOUNTER — Encounter: Payer: Self-pay | Admitting: Psychiatry

## 2023-02-04 ENCOUNTER — Ambulatory Visit: Payer: 59

## 2023-02-04 ENCOUNTER — Ambulatory Visit (INDEPENDENT_AMBULATORY_CARE_PROVIDER_SITE_OTHER): Payer: 59 | Admitting: Psychiatry

## 2023-02-04 VITALS — BP 96/73 | HR 68

## 2023-02-04 DIAGNOSIS — F339 Major depressive disorder, recurrent, unspecified: Secondary | ICD-10-CM

## 2023-02-04 DIAGNOSIS — F5105 Insomnia due to other mental disorder: Secondary | ICD-10-CM | POA: Diagnosis not present

## 2023-02-04 DIAGNOSIS — F900 Attention-deficit hyperactivity disorder, predominantly inattentive type: Secondary | ICD-10-CM | POA: Diagnosis not present

## 2023-02-04 DIAGNOSIS — F411 Generalized anxiety disorder: Secondary | ICD-10-CM

## 2023-02-04 DIAGNOSIS — I10 Essential (primary) hypertension: Secondary | ICD-10-CM

## 2023-02-04 MED ORDER — LORAZEPAM 1 MG PO TABS: 1.0000 mg | ORAL_TABLET | Freq: Three times a day (TID) | ORAL | 1 refills | Status: DC | PRN

## 2023-02-04 NOTE — Progress Notes (Signed)
Laura Chang [id number] 05-Oct-1956 67 y.o.  Subjective:   Patient ID:  Laura Chang is a 67 y.o. (DOB 09-01-1956) female.  Chief Complaint:  Chief Complaint  Patient presents with   Follow-up   Depression   Anxiety   ADD      Laura Chang presents to the office today for follow-up of depression and anxiety and ADD.  seen November 12, 2019.  Melted down in 2020.  Went to [employer] in July.  No withdrawal.  1 drink since then.  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 She was on no stimulant and no SSRI but was taking Strattera and Wellbutrin.  The following changes were made. Stop Strattera. OK restart stimulant bc severe ADD Restart Adderall 1 daily for a few days and if tolerated then restart 1 twice daily. If not tolerated reduce the dosage if needed. May need to stop Wellbutrin if not tolerating the stimulant.  Yes.  DC Wellbutrin Restart Prozac 20 mg daily.  February 2021 appointment with the following noted: Completed grant proposal.  Couldn't doit without Adderall.  Sold a bunch of work.   Adderall XR lasts about 3 pm.  Strength seems about right.  BP been OK.  Not jittery.   Stopped Wellbutrin but had no SE. Mood drastically better with grant proposal and back on fluoxetine.  Less depressed and lethargic.  No anxiety.  Cut back on coffee. Started back with devotions and stronger faith. Plan: Continue Prozac 20 mg daily. May have to increase the dose at some point in the future given that she usually was taking higher dosages but she is getting good response at this time. Restart Wellbutrin off label for ADD since can't get 2 ADDERALL daily. 150 mg daily then 300 mg daily. She can adjust the dose between 150 mg and 300 mg daily to get the optimal effect.   05/11/2020 appointment with the following noted: Has been inconsistent with Prozac and Wellbutrin. Not sure of the effect of Wellbutrin. Biggest deterrent in work is anxiety.   Some of the work is conceptual and difficult at times.  Can feel she's not up to a project at times.  Overall is OK but would like a steadier benefit from stimulant.  Exhausted from managing concentration and keeping up with things from the day.  Loses things.  Not good keeping up with schedule. Overall productive and emotionally OK. Can feel Adderall wear off. Mood is better in summer and worse in the winter.   F died in 10-15-2023 and that is a loss. No SE Wellbutrin. Still attends AA meetings.  Real benefit from [facility] last year. Recognizes effect of anemia on ADD and mood.  Had iron infusions last winter. Plan:  Wellbutrin off label for ADD since can't get 2 ADDERALL daily. 150 mg daily then 300 mg daily.  01/24/2021 appointment with following noted: Doing a program called [employer] mindfulness app since Xmas.  CBT app helped the depression.  App helped her focus better.  Lost sign weight. Writing a lot. Before Xmas felt depressed and started negative thinking worse, self denigrating. Not drinking. More isolated.   Recognizes mo is narcissist.    Didn't tell anyone she was born until 3 mos later.  M aloof and uninterested in pt.  Lied about her birthday.  Mo lack of affection even with pt's kids. Going to [place] for a year and it helped her to quit drinking. Also misses kids being gone  with a hole also. Plan: No med changes  05/04/2021 appointment with the following noted: Therapist [name] thinks she's manic. Lost weight to 144#.   States she is still sleeping okay.  Admits she is hyper and recognizes that she is likely manic.  She feels great, euphoric with an increased sense of spiritual connectedness to God.  She has racing thoughts and talks fast and talks a lot and this is noted by her husband.  He thinks she is a bit hyper.  She has been able to maintain sobriety although she will have 1 glass of wine on special occasions but does not drink by herself.  She is not drinking to  excess.  She denies any dangerous impulsivity.  She is clearly not depressed and not particularly anxious.  She has no concerns about her medication and she has been compliant.  06/16/21 appt noted: So much better.  Going through a lot but the manic thing happened on top of it.  So much slower.  Didn't feel like losing anything with risperidone.  Likes the Adderalll at 10 mg. Some drowsiness in the AM and very drowsy from risperidone 2 mg HS. Prayer life is better. Handling stress better. Less depressed with risperidone. Still likes trazodone. Sleeps well. Plan: Reduce Prozac to 10 mg daily.  Consider stopping it because it can feel the mania however she is reluctant to do that because she fears relapse of depression. Reduce risperidone to 1.5 mg nightly due to side effects.  Discussed risk of worsening mania.  07/25/2021 appointment with the following noted: Misses the Adderall and hard to function without it. Depressed now. Heavy chest.  Anxious and guilty.  Body feels heavy.   Hates Wellbutrin.   Plan: Increase fluoxetine to 20 mg daily Add Abilify 1/2 of 15 mg tablet daily Wean wellbutrin by 1 tablet each week  bc she feels it is not helpful and DT polypharmacy Reduce risperidone to 1 daily for 1 week and stop it. Disc risk of mania. Increase Adderall to XR 20 mg AM  08/08/21 Much less depressed and starting to feel normal I feel a lot better. No SE.  Speech normal off risperidone. Sleeping OK on trazaodone and enough.   Noticed benefit from Adderall again. Plan: continue fluoxetine to 20 mg daily Continue Abilify 1/2 of 15 mg tablet daily for depression and mania continue Adderall to XR 20 mg AM  10/10/2021 phone call: Pt stated she feels like the Abilify should be decreased to 23m.She said she is depressed but rational and not suicidal.She has an appt Monday and can wait until then if you prefer. MD response: Reduce the Abilify to 7.5 mg every other day.  We will meet on 10/16/2021  and decide what to do from there.  10/16/2021 appointment with the following noted: More depressed.  Most depressed I've ever been.  Just numb.  Sense of grief.   Thinks the manic episode was unlike anything else she ever had.  Doesn't want to medicate against it.  Don't enjoy people.  Easily overwhelmed.  Had some death thoughts but not suicidal.  Has been functional.  Feels better today after reducing Abilify to every other day but she is only been doing that for 3 days. A/P: Episode of post manic depression was explained. continue fluoxetine to 20 mg daily Hold Abilify for 1 week then resume Abilify 1/2 of 15 mg tablet every other day for depression and mania continue Adderall to XR 20 mg AM  10/27/2021  appointment with the following noted: I'm doing so much better.  Handling the depression better. Better self talk and spiritual focus has helped.   Dep 6/10 manifesting as anxiety with low confidence.   F died 2  years ago and M 67 yo and is dependent . She is working hard to feel better but still feels depressed.  She almost feels like she has a little more anxiety since restarting Abilify every other day. Plan: continue fluoxetine to 20 mg daily DC Abilify .  Vrayalar 1.5 mg QOD to try to get rid of depression ASAP. continue Adderall to XR 20 mg AM  11/10/2021 appointment with the following noted: Busy with Xmas and it was fun with family but then a big let down.  Did well with it.  Functioned well with it.  Working hard on things with depression.  Not shutting down. Not sure but feels better today but yesterday was hard.  Difficulty dealing with mother.  She won't do anything to help herself.  Yesterday with her all day.  Won't do PT and has isolated herself.    Lack of confidence.   No SE with Vraylar.  11/24/21 urgent appointment appt noted: More and more depressed.   So anxious and doesn't want to be alone but can do so. No appetite. Hurts inside. Has had some fleeting suicidal  thoughts but would not act on them.  Tolerating meds. Has been consistent with Vraylar 1.5 mg every other day, fluoxetine 20 mg daily Plan: Increase Vraylar to 1.5 mg daily Change Prozac to Trintellix 10 mg daily. Discussed side effects of each continue Adderall to XR 20 mg AM  12/27/2021 appointment with the following noted: Not OK.  I feel less depressed but feels bat shit. Not sleeping well.  Extremely anxious. Off and on sleep. 3-4 hours of sleep.   Still having daily SI.  But also become obvious has so much to do.  Overwhelmed by tasks.   Needs anxiety meds to just function. Not more motivated.  Walked yesterday.   Feels afraid like in trouble but not irritable or angry. DC DT agitation Vraylar to 1.5 mg daily Change Prozac to Trintellix 10 mg daily. Hold Adderall to XR 20 mg AM Clonidine 0.1 1/2 tablet twice daily for 2 days and if needed for anxiety and sleep increase to 1 twice daily Ok temporary Ativan 1 mg 3 times daily as needed anxiety  01/05/22 appt noted: Off fluoxetine and  Trintellix.  Only on Ativan, trazodone and Adderall XR 20 plus added clonidine 0.1 mg BID Didn't think she needed to start Trintellix. Not taking Ativan.   Didn't like herself last week. Feels some better today. Wonders if the manic sx Not agitated.  Anxiety kind of calmed down.  A lot to be anxious about situationally.  $ stress. Concerns about downers with meds. Can't access normal personality. ? Lethargy and inability to talk as sE. Plan: Latuda 20-40 mg daily with food. Adderall to XR 20 mg AM Clonidine 0.1 1/2 tablet twice daily  reduce dose to be sure no SE Ok temporary Ativan 1 mg 3 times daily as needed anxiety  01/19/22 appt noted: Taking Latuda 20 mg daily.  Took 40 mg once and felt anxious and  SI Still depressed and not very reactive Anxiety mainly about the depression and fears of the future. She wants to revisit manic sx and thinks it was maybe bc taking delta 8 bc was taking a lot  of it so still  doesn't think she's classic bipolar. She wants to only take Prozac bc thinks Latuda is perpetuating depression. Says the delta 8 was very psychaedelic.  When not taking it was not manic.  Sleeping ok again.  Plan: Per her request DC Latuda 20-40 mg daily with food. She wants to continue Prozac alone AMA  Adderall to XR 20 mg AM Clonidine 0.1 1/2 tablet twice daily  reduce dose to be sure no SE Ok temporary Ativan 1 mg 3 times daily as needed anxiety  01/23/2022 phone call complaining of increased anxiety since stopping Latuda.  She will try increasing clonidine.  01/26/2022 phone call not feeling well and wanted to restart the Vraylar.  However notes indicate that had made her agitated therefore she was encouraged to pick up samples of Rexulti 1 mg and start that instead.  02/06/2022 phone call: Stating she felt the Rexulti was helping with depression but she was not sleeping well and obsessing over things.  She was encouraged to increase Rexulti to 2 mg daily and increase trazodone for sleep.  02/09/2022 appointment with the following noted: This was an urgent work in appointment No sleep last night with trazodone 100 mg HS Nothing really better depression or anxiety. Ruminating negative anxious thoughts. Did not tolerate Rexulti because it was causing insomnia.  Does not think it helped depression.  Lacks emotion that she should have.  Lacks her usual personality.  Some hopeless thoughts.  Some death thoughts.  Some suicidal thoughts without plan or intent Plan: DC Rexulti and Prozac & DC trazodone Adderall to XR 20 mg AM Clonidine 0.1 1/tablet twice daily  reduce dose to be sure no SE Ok temporary Ativan 1 mg 3 times daily as needed anxiety Start Seroquel XR 150 mg nightly  03/02/2022 appointment: Langley Gauss called back a few days after starting Seroquel stating it was making her more anxious and more depressed.  This seemed unlikely as this medicine rarely ever causes anxiety.   She stopped the medication waited 3 days and called back still had anxiety and depression but thought perhaps the anxiety was a little better.  She did not want to take the Seroquel. She knew about the option of Spravato and wanted to pursue that. Now questions whether to return to Seroquel while waiting to start Spravato bc feels just as bad without it and knows she didn't give it enough time to work.   MADRS 46  ECT-MADRS    Flowsheet Row Clinical Support from 01/08/2023 in [place] from 08/06/2022 in [place] from 07/04/2022 in [place] from 05/21/2022 in [place] Office Visit from 03/02/2022 in [facility]  MADRS Total Score '21 29 15 27 '$ 46      03/14/22 appt noted: Pt received Spravato 56 mg first dose today with some dissociative sx which were not severe.  She was anxious prior to the administration and felt better after receiving lorazepam 1 mg.  No NV, or HA. Wants to continue Spravato. Ongoing depression and desperate to feel better.  I'm not myself DT deprsssion which is most severe in recent history.  Anhedonia.  Low motivation.  Social avoidance. Continues to think all recent med trials are making her worse.  Sleep ok with Seroquel.  03/16/22 appt noted: Received Spravato 84 mg for the first time.  some dissociative sx which were not severe.  She was anxious prior to  the administration and felt better after receiving lorazepam 1 mg.  No NV, or HA. Wants to continue Spravato.   Does not feel any better or different since the last appt.  Ongoing depression.  Ongoing depression and desperate to feel better.  I'm not myself DT deprsssion which is most severe in recent history.  Anhedonia.  Low motivation.  Social avoidance. Continues to think all recent med trials are making her worse.  Sleep  ok with Seroquel.  Does not want to continue Seroquel for TRD.  03/20/2022 appointment noted: Came for Spravato administration today.  However blood pressure was significantly elevated approximately 180/115.  She was given lorazepam 1 mg and clonidine 0.2 mg to try to get it down. She states she regretted stopping the Seroquel XR 300 mg tablets.  She now realizes it was helpful.  She did not sleep much at all last night.  She did not take the Adderall this morning. 2 to 3 hours after arrival blood pressure was still elevated at  170/110, 62 pulse.  For Spravato administration was canceled for today.  She admits to being anxious and depressed.  She is not suicidal.  She is highly motivated to receive the Spravato.  We discussed getting it tomorrow.  03/22/2022 appointment noted: Patient's blood pressure was never stable enough yesterday in order to get her in for Spravato administration.  She was encouraged to see her primary care doctor.  It is better today.  03/26/2022 appointment with the following noted: Blood pressure was better.  Saw her primary care doctor who started on oral Bystolic 2.5 mg daily. Received Spravato 84 mg today as scheduled.  Tolerated it well without nausea or vomiting headache or chest pain or palpitations.  Her blood pressure was borderline but manageable. She remains depressed and anxious.  She is ambivalent about the medicine and desperate to get to feel better.  Continues to have anhedonia and low energy and low motivation and reduced ability to do things.  Less social.  Not suicidal.  03/28/22 appt noted: Received Spravato 84 mg today as scheduled.  Tolerated it well without nausea or vomiting headache or chest pain or palpitations.  Her blood pressure was borderline but manageable. Has not seen any improvement so far.  Tolerating Seroquel.  Inconsistent with Bystolic and BP has been borderline high. Still depressed and anxious and anhedonia.  Low motivation, energy,  productivity. Taking quetiapine and tolerating XR 300 mg nightly.  04/04/22 appt noted: Received Spravato 84 mg today as scheduled.  Tolerated it well without nausea or vomiting headache or chest pain or palpitations.  Her blood pressure was borderline but manageable. Has not seen any improvement so far.  Tolerating Seroquel.   She still tends to think that the medications are making her worse.  She has said this about each of the recent psychiatric medicines including Seroquel.  However her husband thinks she is improved.  She also admits there is some improvement in productivity.  She still feels highly anxious.  She still does not enjoy things as normal.  She still feels desperate to improve as soon as possible. Has been taking Seroquel XR since 03/20/2022  04/10/22 appt noted: Received Spravato 84 mg today as scheduled.  Tolerated it well without nausea or vomiting headache or chest pain or palpitations.  Her blood pressure was borderline but manageable. Has not seen any improvement so far.  Tolerating Seroquel.  Doesn't like Seroquel bc she thinks it flattens here. 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  Tolerated it well without nausea or vomiting headache or chest pain or palpitations.  Her blood pressure was borderline but manageable. Has not seen any improvement so far.  Tolerating Seroquel.  Doesn't like Seroquel bc she thinks it flattens her. Received Spravato 84 mg today as scheduled.  Tolerated it well without nausea or vomiting headache or chest pain or palpitations.  Her blood pressure was borderline but manageable. Has not seen any improvement so far.  Tolerating Seroquel.  Doesn't like Seroquel bc she thinks it flattens here.  We discussed her ambivalence about it. She is starting Auvelity and has tolerated it  the last 2 days without side effect.  She still does not feel like herself and feels flat and not enjoying things with suppressed expressed emotion  04/17/2022 appointment with the following noted: Received Spravato 84 mg today as scheduled.  Tolerated it well without nausea or vomiting headache or chest pain or palpitations.  Her blood pressure was borderline but manageable. Has not seen any improvement so far.  Tolerating Seroquel.  Doesn't like Seroquel bc she thinks it flattens her. She has been tolerating the Auvelity 1 in the morning without side effects for about a week.  She has not noticed significant improvement so far.  She still feels depressed and flat and not herself.  Other people notice that she is flat emotionally.  She is not suicidal.  She does feel discouraged that she is not getting better yet.  04/19/2022 appointment noted: Has increased Auvelity to 1 twice daily for 2 days, continues quetiapine XR 300 mg nightly, clonidine 0.3 mg twice daily, lorazepam 1 mg twice daily for anxiety and Adderall XR 20 mg in the morning. No obious SE but she still thinks quetiapine XR is making her feel down.  But not sedated Received Spravato 84 mg today as scheduled.  Tolerated it well without nausea or vomiting headache or chest pain or palpitations.  Her blood pressure was borderline but manageable. She still feels quite anxious and feels it necessary to take both the clonidine and lorazepam twice a day to manage her anxiety.  She has been consistently down and flat and not herself until yesterday afternoon she noted an improvement in mood and feeling much more like herself with her normal personality reemerging.  She was quite depressed in the morning with very dark negative thoughts.  She did not have those dark negative thoughts this morning.  She had a lot of questions about medication and when she was expecting to be improved and why she has not shown improvement up to now.  04/23/22 appt  noted: Has increased Auvelity to 1 twice daily for 1 week, continues quetiapine XR 300 mg nightly, clonidine 0.3 mg twice daily, lorazepam 1 mg twice daily for anxiety and Adderall XR 20 mg in the morning. No obious SE but she still thinks quetiapine XR is making her feel down.  But not sedated Received Spravato 84 mg today as scheduled.  Tolerated it well without nausea or vomiting headache or chest pain or palpitations.  She is still depressed but admits better function and is able to enjoy social interactions. Tolerating meds.  Would like to feel better for sure. Not herself.  Flat. Plan increase Auvelity to 1 tab BID as planned and reduce Quetiapine to 1/2 of ER 300 mg  bc NR for depression.  04/25/2022 appointment with the following noted: clonidine 0.3 mg twice daily, lorazepam 1 mg twice daily for anxiety and Adderall XR 20 mg in the morning. Seroquel XR 300 HS No obious SE but she still thinks quetiapine XR is making her feel down.  But not sedated Received Spravato 84 mg today as scheduled.  Tolerated it well without nausea or vomiting headache or chest pain or palpitations.  Called yesterday with more anxiety.  Had increased Auvelity for 1 day and reduced Seroquel XR for 1 day.  Felt restless and fearful  05/01/2022 appointment noted: clonidine 0.3 mg twice daily, lorazepam 1 mg twice daily for anxiety and Adderall XR 20 mg in the morning. Seroquel XR 150 HS, Auvelity 1 BID Received Spravato 84 mg today as scheduled.  Tolerated it well without nausea or vomiting headache or chest pain or palpitations.  Nurse has noted patient has called multiple times sometimes asking the same question repeatedly.  It is unclear whether she is truly forgetful or is just anxious seeking reassurance. Patient acknowledges ongoing depression as well as some anxiety but states she has felt a little better in the last couple of days.  She has reduced the Seroquel to 150 mg at night and has increased Auvelity to 1  twice daily but only for 1 day.  So far she seems to be tolerating it.  05/03/22 appt noted: clonidine 0.2 mg twice daily, lorazepam 1 mg twice daily for anxiety and Adderall XR 20 mg in the morning. Seroquel XR 150 HS, Auvelity 1 BID BP high this am about 170/100 and received extra clonidine 0.2 mg and came to receive Spravato.  Not dizzy, no SOB, nor CP but BP is still high Could not receive Spravato today bc BP high and pulse low at 30 ppm. Still depressed and anxious. Plan: continue trial Auvelity with Spravato She needs to get BP and pulse managed  05/08/22 TC: RTC  H Michael NA and mailbox full.  Could not leave message.  Pt  -  talked to she and H on speaker. H worried over wife.  Vacant stare.  Slurs words at times.  Not smiling. Reduced enjoyment.  Depression.  Withdrawn from usual activities.  Some irritability.  Anxious. Disc her concerns meds are making her worse.  Extensive discussion about her treatment resistant status.  There is a consistent pattern of not taking the medicines long enough to get benefit because she believes the meds are making her worse.  However the symptoms she describes as side effects are exactly the same symptoms that she had prior to taking the medication RX for  the depression.  So it is not clear that these are actual side effects. This is true about the 2 most recent meds including Seroquel and Auvelity.  Recommend psychiatric consultation in hopes of improving her comfort level with taking prescribed medications for a sufficient length of time to provide benefit. Extensive discussion about ECT is the treatment of choice for treatment resistant depression.  Spravato may work if she can comply with consistency.  There are medication options but they take longer to work.   Plan:  Reduce clonidine to 0.1 mg BID DT bradycardia.  Talk with PCP about BP and low pulse problems which are interfering with her consistent compliance with Spravato.   Limit lorazepam to  3 -4  mg daily max. Excess use is the cause of slurring speech.  She must stop excess use or will have to stop the med. Stop Auvelity per  her request.  But she has only been on the full dose for a little over a week and clearly has not had time to get benefit from it.  She thinks maybe it is making her more anxious. Reduce Seroquel from 150XR to 50 -100 mg at night IR.  She couldn't sleep when stopped it completely. Will not start new antidepressant until her SE issues are resolved or not. Get second psych opinion from Yehuda Budd MD or another psychiatrist.  H's sister is therapist in [place], MD, [facility]  05/16/2022 appointment with the following noted: Received Spravato 84 mg today as scheduled.  Tolerated it well without nausea or vomiting headache or chest pain or palpitations.  She stopped Auvelity as discussed last week. On her own, without physician input, she restarted Wellbutrin XL 450 mg every morning today.  She had taken it in the past.  She feels jittery and anxious. She feels less depressed than she did last week.  But she is still depressed without her usual range of affect.  She still is less social and less motivated than normal. Her primary care doctor increased the dose of losartan Plan: Stop Seroquel Reduce Wellbutrin XL to 300 mg every morning.  Starting the dose at 450 every morning is likely causing side effects of jitteriness and it should not be started at that have a dosage. Recommend she not change meds on her own without MDM put  05/23/2022 appointment with the following noted: Received Spravato 84 mg today as scheduled.  Tolerated it well without nausea or vomiting headache or chest pain or palpitations.  Has not dropped seroquel XR 300 mg 1/2 tablet nightly bc couldn't sleep without it. Has not tried lower dose quetiapine 50 mg HS Still feels depressed.   BP is better managed so far, just saw PCP.  BP is better today and infact is low today. Dropped  clonidine as directed from 0.3 mg BID bc inadequate control of BP to 0.2 mg BID.  However she wants to increase it back to 0.3 mg twice daily because she feels it helped her anxiety better.  Wonders about increasing Wellbutrin for depression.  However she has only been on 300 mg a day for a week.  She was on 450 mg daily in the past.  06/06/22 appt noted: Received Spravato 84 mg today as scheduled.  Tolerated it well without nausea or vomiting headache or chest pain or palpitations.  She is still depressed and anxious.  She wants to try to stop the Seroquel but cannot sleep without some of it.  She is taking lorazepam 1 mg 4 times daily and still having a lot of anxiety.  She wants to increase clonidine back to 0.3 mg twice daily.  She hopes for more improvement She recently went for a second psychiatric opinion as suggested the results of that are pending.  06/11/22 appt noted: Received Spravato 84 mg today as scheduled.  Tolerated it well without nausea or vomiting headache or chest pain or palpitations.  She is still depressed and anxious. Without much change.  Still hopeless, anhedonia, reduced inteterest and motivation.  Tolerating meds. Disc concerns Spravato is not hleping much. Plan: stop Seroquel and start olanzapine 10 mg HS for TRD and anxiety.  06/13/2022 appointment noted: Received Spravato 84 mg today as scheduled.  Tolerated it well without nausea or vomiting headache or chest pain or palpitations.  She is still depressed and anxious. Without much change.  Still hopeless, anhedonia, reduced inteterest  and motivation.  Tolerating meds. Disc concerns Spravato is not helping much as hoped but is improving a bit in the last week. Tolerating meds. Continues Wellbutrin XL 450 AM, tolerating recently started olanzapine  10 mg HS. Sleep is good.   Pending appt with [place] consult.  06/18/22 appt noted: Received Spravato 84 mg today as scheduled.  Tolerated it well without nausea or vomiting  headache or chest pain or palpitations.  Tolerating meds. Continues Wellbutrin XL 450 AM, tolerating recently started olanzapine  10 mg HS. Continues Adderall XR 20 amd and has tried to reduce lorazepam to '1mg'$  TID Sleep is good.   Pending appt with [place] consult. Depression is a little bit better in the last week with a little improvement in emotional expression and interest.  She is pushing herself to be more active.  Her daughter thought she was a little better than she has been.  However she is still depressed and still not her normal self with anhedonia and reduced emotional expressiveness.  06/20/22 appt noted: Received Spravato 84 mg today as scheduled.  Tolerated it well without nausea or vomiting headache or chest pain or palpitations.  Tolerating meds with a little sleepiness. Continues Wellbutrin XL 450 AM, tolerating recently started olanzapine  10 mg HS. Continues Adderall XR 20 amd and has tried to reduce lorazepam to '1mg'$  TID Sleep is good.   Mood is improving.  Better funciton.  Anxiety is better with olanzapine. Still not herself and depression not gone with some anhedonia and social avoidance and feeling overwhelmed.  8/14 2023 received Spravato 84 mg 06/27/2022 received Spravato Spravato 84 mg 07/02/2022 received Spravato 84 mg 07/04/2022 received Spravato 84 mg  07/09/2022 appointment noted: Received Spravato 84 mg today as scheduled.  Tolerated it well without nausea or vomiting headache or chest pain or palpitations.  Expected dissociation and feels less depressed with resolution of negative emotions immediately after Spravato and then depression, anxiety creep back in. Continues meds Adderall XR 20 mg every morning, Wellbutrin XL 450 every morning, clonidine 0.1 mg twice daily, lorazepam 1 mg every 6 hours as needed, olanzapine increased from 7.5 to 10 mg nightly on  Tolerating meds.  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  She does note some increased appetite and is somewhat concerned about that but has not gained significant amounts of weight. She has had the [place] consultation which was initially denied but she knows it can be appealed.  However because she is improving with Spravato plus the other medications now she wants to continue the current treatment plan.  07/18/22 appt noted: Continues meds Adderall XR 20 mg every morning, Wellbutrin XL 450 every morning, clonidine 0.1 mg twice daily, lorazepam 1 mg every 6 hours as needed, olanzapine increased from 7.5 to 10 mg nightly on 07/04/2022. Received Spravato 84 mg today as scheduled.  Tolerated it well without nausea or vomiting headache or chest pain or palpitations.  Expected dissociation and feels less depressed with resolution of negative emotions immediately after Spravato and then depression, anxiety creep back in. Continues meds Adderall XR 20 mg every morning, Wellbutrin XL 450 every morning, clonidine 0.1 mg twice daily, lorazepam 1 mg every 6 hours as needed, olanzapine increased from 7.5 to 10 mg nightly on  Tolerating meds.  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  She does note some increased appetite and is somewhat concerned about that but has not gained significant amounts of weight. She has had the [place] consultation which was initially denied but she knows it can be appealed. She continues to have chronic ambivalence about psychiatric medicines and initially tends to blame her depressive symptoms such as decreased concentration and feeling flat on what ever medicine she currently is taking even though she had the same symptoms before the current medicines were started.  Then after discussion she does admit that her depressive symptoms are improved since adding  olanzapine but still has those residual symptoms noted.  07/23/22 received Spravato 84 mg   07/30/2022 appointment noted: Received Spravato 84 mg today as scheduled.  Tolerated it well without nausea or vomiting headache or chest pain or palpitations.  Expected dissociation and feels less depressed with resolution of negative emotions immediately after Spravato and then depression, anxiety creep back in. Continues meds Adderall XR 20 mg every morning, Wellbutrin XL 450 every morning, clonidine 0.1 mg twice daily, lorazepam 1 mg every 6 hours as needed, olanzapine increased from 7.5 to 10 mg nightly on  She has been inconsistent with olanzapine because she continues to be ambivalent about the medications in general and thinks that perhaps the 10 mg is making her feel blunted.  She continues to feel some depression.  She had a good day this week and but still feels somewhat depressed and persistently anxious. Plan: be consistent with olanzapine 10 mg HS for TRD and longer trial for potential benefit for anxiety.  Has not taken it consistently.  08/06/2022 appointment noted: Received Spravato 84 mg today as scheduled.  Tolerated it well without nausea or vomiting headache or chest pain or palpitations.  Expected dissociation and feels less depressed with resolution of negative emotions immediately after Spravato and then depression, anxiety creep back in. Continues meds Adderall XR 20 mg every morning, Wellbutrin XL 450 every morning, clonidine 0.1 mg twice daily, lorazepam 1 mg every 6 hours as needed, olanzapine i 10 mg nightly  She continues to feel depressed but is about 50% better with Spravato.  She is still not herself.  She still has anhedonia.  She still is not her able to engage socially in the typical ways.  She is not jovial and outgoing like normal.  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  She continues to feel that her personality is dampened down.  There is a question about whether it is related to depression or medication. Plan: continue olanzapine 10 for longer trial for TRD and severe anxiety.  08/13/22 appt noted:  Received Spravato 84 mg today as scheduled.  Tolerated it well without nausea or vomiting headache or chest pain or palpitations.  Expected dissociation and feels less depressed with resolution of negative emotions immediately after Spravato and then depression, anxiety creep back in. Continues meds Adderall XR 20 mg every morning, Wellbutrin XL 450 every morning, clonidine 0.1 mg twice daily, lorazepam 1 mg every 6 hours as needed, olanzapine i 10 mg nightly  She still does not feel herself.  Still struggles with depression and low motivation and reduced social engagement and reduced interest and reduced emotional expression.  She is somewhat better with the medicines plus Spravato.  She still believes the Spravato makes her blunted and  is not sure how much it helps her anxiety.  She can have good days when her family is around and she is engaged.  She still wants to stop the olanzapine. She has apparently continued to take the trazodone despite having been told to stop it when she started olanzapine.  She feels like she needs the trazodone. Plan: DC olanzapine and Start nortriptyline 25 mg nightly and build up to 75 mg nightly and then check blood level.    08/27/2022 appointment noted: Received Spravato 84 mg today as scheduled.  Tolerated it well without nausea or vomiting headache or chest pain or palpitations.  Expected dissociation and feels less depressed with resolution of negative emotions immediately after Spravato and then depression, anxiety creep back in. Continues meds Adderall XR 20 mg every morning, Wellbutrin XL 450 every morning, clonidine 0.1 mg twice daily, lorazepam 1 mg every 6 hours as needed. Stopped olanzapine and started  nortriptyline which she has taken for about a week is 75 mg nightly. So far she is tolerating the nortriptyline well with the exception of some dry mouth and constipation which she is working to manage.  She does not feel substantially better better or different off the olanzapine.  No change in her sleep which is good.  Main concern currently in addition to the residual depression is anxiety which is somewhat situational with pending arch show.  She is worrying about it more than normal.  Says she is having to take lorazepam twice a day where she had been able to keep reduce it prior to this.  She still does not feel like herself with residual depression with less social interest and less of her usual buoyancy in personality.  She is flatter than normal.  Overall she still feels that the Spravato has been helpful at reducing the severity of the depression.  She is not suicidal. She has not heard anything about the [place] appeal as of yet.  09/05/2022 appointment noted: Received Spravato 84 mg today as scheduled.  Tolerated it well without nausea or vomiting headache or chest pain or palpitations.  Expected dissociation and feels less depressed with resolution of negative emotions immediately after Spravato and then depression, anxiety creep back in. Continues meds Adderall XR 20 mg every morning, Wellbutrin XL 450 every morning, clonidine 0.1 mg twice daily, lorazepam 1 mg every 6 hours as needed. Stopped olanzapine and started nortriptyline which she has taken for about 2 week is 75 mg nightly. Initially blood pressure was a little high causing delay in starting Spravato.  She admitted to feeling a little wound up.  She still experiences a little increase in depression if she goes longer than a week in between doses of Spravato.  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  09/10/22 appt noted: Received  Spravato 84 mg today as scheduled.  Tolerated it well without nausea or vomiting headache or chest pain or palpitations.  Expected dissociation gradually resolved over the 2 hour observation period. She feels 50% less depressed with Spravto and wants to continue it.   Continues meds Adderall XR 20 mg every morning, Wellbutrin XL 450 every morning, clonidine 0.1 mg twice daily, lorazepam 1 mg every 6 hours as needed. Has started nortriptyline 75 mg nightly for about 3 weeks. Has not seen a significant difference with the addition of nortriptyline.  Tolerating it pretty well. She continues  to have some degree of anhedonia and significant depression and anxiety.  Her daughters noticed that she is more needy and calls more frequently.  She acknowledges this as well.  She is clearly still not herself. Plan: pramipexole off label and RX 0.25 mg BID  09/17/2022 appointment noted: Received Spravato 84 mg today as scheduled.  Tolerated it well without nausea or vomiting headache or chest pain or palpitations.  Expected dissociation gradually resolved over the 2 hour observation period. She feels 50% less depressed with Spravto and wants to continue it.   Continues meds Adderall XR 20 mg every morning, Wellbutrin XL 450 every morning, clonidine 0.1 mg twice daily, lorazepam 1 mg every 6 hours as needed. Has started nortriptyline 75 mg nightly for about 3 weeks and DT level 176, reduced to 50 mg HS early November. Still the same sx as noted last visit.  Tolerating meds.   Compliant.  Still depressed and family notices.  Has been able to participate in family interactions.  Some post-show let down and has to do detailed work which is hard for her bc ADD.  Sleep and eating well.  Energy OK but not great.  No SI.  Not cried in a year or so.  Clearly less depressed and hopeless than before the Spravato.  09/24/22 appt noted: Received Spravato 84 mg today as scheduled.  Tolerated it well without nausea or vomiting  headache or chest pain or palpitations.  Expected dissociation gradually resolved over the 2 hour observation period. She feels 50% less depressed with Spravto and wants to continue it.   Continues meds Adderall XR 20 mg every morning, Wellbutrin XL 450 every morning, clonidine 0.1 mg twice daily, lorazepam 1 mg every 6 hours as needed. Has started nortriptyline 75 mg nightly for about 3 weeks and DT level 176, reduced to 50 mg HS early November. Still the same sx as noted last visit.  Tolerating meds.   Compliant.  Still depressed and family notices.  Has been able to participate in family interactions.  Some post-show let down and has to do detailed work which is hard for her bc ADD.  Sleep and eating well.  Energy OK but not great.  No SI.  Not cried in a year or so.  Clearly less depressed and hopeless than before the Spravato. Is not making further progress generally.  Stuck with moderate depression  10/02/22 appt noted: Received Spravato 84 mg today as scheduled.  Tolerated it well without nausea or vomiting headache or chest pain or palpitations.  Expected dissociation gradually resolved over the 2 hour observation period. She feels 50% less depressed with Spravto and wants to continue it.   Continues meds Adderall XR 20 mg every morning, Wellbutrin XL 450 every morning, clonidine 0.1 mg twice daily, lorazepam 1 mg every 6 hours as needed. Has started nortriptyline 75 mg nightly for about 3 weeks and DT level 176, reduced to 50 mg HS early November. Still the same sx as noted last visit.  Tolerating meds.   Compliant.  Still depressed and family notices.  Has been able to participate in family interactions.  Some post-show let down and has to do detailed work which is hard for her bc ADD.  Sleep and eating well.  Energy OK but not great.  No SI.  Not cried in a year or so.  Clearly less depressed and hopeless than before the Spravato. Is not making further progress generally.  Stuck with moderate  depression.  Is  able to function pretty normally. Plan: trial pramipexole 0.25 mg BID off label for depression.   10/08/22 appt noted: Received Spravato 84 mg today as scheduled.  Tolerated it well without nausea or vomiting headache or chest pain or palpitations.  Expected dissociation gradually resolved over the 2 hour observation period. She feels 50% less depressed with Spravto and wants to continue it.   Continues meds Adderall XR 20 mg every morning, Wellbutrin XL 450 every morning, clonidine 0.1 mg twice daily, lorazepam 1 mg every 6 hours as needed. Has started nortriptyline 75 mg nightly for about 3 weeks and DT level 176, reduced to 50 mg HS early November. Still the same sx as noted last visit.  Tolerating meds.   Compliant.  Still depressed and family notices.  Has been able to participate in family interactions.  Some post-show let down and has to do detailed work which is hard for her bc ADD.  Sleep and eating well.  Energy OK but not great.  No SI.  Not cried in a year or so.  Clearly less depressed and hopeless than before the Spravato. Is not making further progress generally.  Stuck with moderate depression.  Behaved and felt pretty normally with family over for Thanksgiving. Doesn't see benefit or SE with pramipexole but thinks maybe it makes her worse. Plan:  increase pramipexole augmentation off label to 0.5 mg BID  10/15/2022 appointment noted: Received Spravato 84 mg today as scheduled.  Tolerated it well without nausea or vomiting headache or chest pain or palpitations.  Expected dissociation gradually resolved over the 2 hour observation period. She feels 50% less depressed with Spravto and wants to continue it.   Continues meds Adderall XR 20 mg every morning, Wellbutrin XL 450 every morning, clonidine 0.2 mg twice daily, lorazepam 1 mg every 6 hours as needed.  Trazodone 50 mg tablets 1-2 nightly as needed insomnia Has started nortriptyline 75 mg nightly for about 3 weeks  and DT level 176, reduced to 50 mg HS early November. Recommended increase pramipexole 0.5 mg twice daily off label for treatment resistant depression on 10/08/2022. She feels better motivated more active with pramipexole 0.5 mg twice daily.  She still is depressed but it is better.  We discussed the possibility of going up in the dose but did not change it.  10/22/2022 appointment noted: Received Spravato 84 mg today as scheduled.  Tolerated it well without nausea or vomiting headache or chest pain or palpitations.  Expected dissociation gradually resolved over the 2 hour observation period. She feels 50% less depressed with Spravto and wants to continue it.   Continues meds Adderall XR 20 mg every morning, Wellbutrin XL 450 every morning, clonidine 0.2 mg twice daily, lorazepam 1 mg every 8 hours as needed.  Trazodone 50 mg tablets 1-2 nightly as needed insomnia Has started nortriptyline 75 mg nightly for about 3 weeks and DT level 176, reduced to 50 mg HS early November. pramipexole 0.5 mg twice daily off label for treatment resistant depression on 10/08/2022. She is better motivated than she was.  She is journaling 3 pages a day.  She has started walking and has walked 5 days a week for 50 minutes for the last 2 weeks.  That is significantly helped her mood.  Her mood tends to be better when she interacts with family.  However she still has some periods of depression.  She is tolerating the medications well.  She still feels like her affect and confidence is not  back to normal. Plan:  increase pramipexole augmentation off label to 0.5 mg tID or 0.75 mg BID   10/29/22 appt noted: Received Spravato 84 mg today as scheduled.  Tolerated it well without nausea or vomiting headache or chest pain or palpitations.  Expected dissociation gradually resolved over the 2 hour observation period. She feels 50% less depressed with Spravto and wants to continue it.   Continues meds Adderall XR 20 mg every  morning, Wellbutrin XL 450 every morning, clonidine 0.2 mg twice daily, lorazepam 1 mg every 8 hours as needed.  Trazodone 50 mg tablets 1-2 nightly as needed insomnia Has started nortriptyline 75 mg nightly for about 3 weeks and DT level 176, reduced to 50 mg HS early November. pramipexole increased to 0.75 mg twice daily off label for treatment resistant depression  She has been feeling somewhat better with the increase in pramipexole and is tolerating it well.  She still is easily overwhelmed.  Her affect and mood can improve now when around her family or doing something positive.  She has been able to be more productive. She is tolerating the current medications. She is still considering TMS as an alternative to Spravato.  11/15/2022 appointment noted: Received Spravato 84 mg today as scheduled.  Tolerated it well without nausea or vomiting headache or chest pain or palpitations.  Expected dissociation gradually resolved over the 2 hour observation period. She feels 50% less depressed with Spravto and wants to continue it.   Continues meds Adderall XR 20 mg every morning, Wellbutrin XL 450 every morning, clonidine 0.2 mg twice daily, lorazepam 1 mg every 8 hours as needed.  Trazodone 50 mg tablets 1-2 nightly as needed insomnia Has started nortriptyline 75 mg nightly for about 3 weeks and DT level 176, reduced to 50 mg HS early November. pramipexole increased to 0.75 mg twice daily off label for treatment resistant depression  He has noticed some increase in depression due to the length of time since the last Spravato administration.  She believes Spravato is helping her.  sHe is not suicidal but has felt very blue the last few days. She is tolerating the medication. She is ambivalent about Spravato versus [place] but she is considering [place]. She wants to continue Spravato administration because it is clearly helpful.  11/19/22 appt noted: Received Spravato 84 mg today as scheduled.  Tolerated it well  without nausea or vomiting headache or chest pain or palpitations.  Expected dissociation gradually resolved over the 2 hour observation period. She feels 50% less depressed with Spravto and wants to continue it.   Continues meds Adderall XR 20 mg every morning, Wellbutrin XL 450 every morning, clonidine 0.2 mg twice daily, lorazepam 1 mg every 8 hours as needed.  Trazodone 50 mg tablets 1-2 nightly as needed insomnia Has started nortriptyline 75 mg nightly for about 3 weeks and DT level 176, reduced to 50 mg HS early November. pramipexole increased to 0.75 mg twice daily off label for treatment resistant depression  She has felt better back on Spravato more regularly.  However she still has residual depression esp when alone or when wihtout activity.  Can function when needed.   Does not have her connfidence back. She plans to pursue [place] availability. Have discussed retrying [place]  11/28/22 appt noted: Received Spravato 84 mg today as scheduled.  Tolerated it well without nausea or vomiting headache or chest pain or palpitations.  Expected dissociation gradually resolved over the 2 hour observation period. She feels 50% less  depressed with Spravto and wants to continue it.   Continues meds Adderall XR 20 mg every morning, Wellbutrin XL 450 every morning, clonidine 0.2 mg twice daily, lorazepam 1 mg every 8 hours as needed.  Trazodone 50 mg tablets 1-2 nightly as needed insomnia Has started nortriptyline 75 mg nightly for about 3 weeks and DT level 176, reduced to 50 mg HS early November. pramipexole increased to 0.75 mg twice daily off label for treatment resistant depression  Last week she was more depressed than usual for reasons that are not clear.  She has not had a clear answer from [facility] [place] about [facility] options.  States that they have not returned her call.  She is asked about Auvelity retry in place of Wellbutrin.  Has still been walking.  12/03/22 appt noted: Received Spravato 84 mg today  as scheduled.  Tolerated it well without nausea or vomiting headache or chest pain or palpitations.  Expected dissociation gradually resolved over the 2 hour observation period. She feels 50% less depressed with Spravto and wants to continue it.   Continues meds Adderall XR 20 mg every morning, Wellbutrin XL 450 every morning, clonidine 0.2 mg twice daily, lorazepam 1 mg every 8 hours as needed.  Trazodone 50 mg tablets 1-2 nightly as needed insomnia started nortriptyline 75 mg nightly for about 3 weeks and DT level 176, reduced to 50 mg HS early November. pramipexole increased to 0.75 mg twice daily off label for treatment resistant depression  No SE .  Satisfied with meds. Depression is better in the last week.  Not sure why that is the case.  Still walking daily and that helps and journaling 3 pages daily.  Working on [employer], academic.  No changes desire.  Clearly benefits from Spravato but not 100%.  Still considering TMS.  12/10/22 appt noted: Received Spravato 84 mg today as scheduled.  Tolerated it well without nausea or vomiting headache or chest pain or palpitations.  Expected dissociation gradually resolved over the 2 hour observation period. She feels 50% less depressed with Spravto and wants to continue it.   Continues meds Adderall XR 20 mg every morning, Wellbutrin XL 450 every morning, clonidine 0.2 mg twice daily, lorazepam 1 mg every 8 hours as needed.  Trazodone 50 mg tablets 1-2 nightly as needed insomnia started nortriptyline 75 mg nightly for about 3 weeks and DT level 176, reduced to 50 mg HS early November. pramipexole increased to 0.75 mg twice daily off label for treatment resistant depression  No SE .   Depression was worse this week for no apparent reason.  She struggled being positive.  She has felt more discouraged.  She has felt more anxious.  She has had a hard time doing tasks.  She is interested in retrying the [place] which we had discussed previously. Plan: She agrees to  [facility].  To improve tolerability and reduce risk of side effects, Stop Wellbutrin and start Auvelity 1 in the morning for 1 week then 1 twice daily AndReduce pramipexole 0.5 mg BID and reduce nortriptyline to 50 mg HS  12/17/22 appt noted: Received Spravato 84 mg today as scheduled.  Tolerated it well without nausea or vomiting headache or chest pain or palpitations.  Expected dissociation gradually resolved over the 2 hour observation period. She feels 50% less depressed with Spravto and wants to continue it.   Continues meds Adderall XR 20 mg every morning, stopped Wellbutrin XL 150 every morning, clonidine 0.2 mg twice daily, lorazepam 1 mg every  8 hours as needed.  Trazodone 50 mg tablets 1-2 nightly as needed insomnia Has started nortriptyline 75 mg nightly for about 3 weeks and DT level 176, reduced to 50 mg HS early November. pramipexole 0.375 mg twice daily off label for treatment resistant depression with plan to stop Started Auvelity 1 AM Still depressed to moderate degree.  Tolerating Auvelity so far.  No other problems with meds.  Willing to give Auvelity a chance. Plan: She agrees to [facility].  Continue 1 AM and when tolerated then increase to BID Stop pramipexole.  12/26/22 received Spravato  01/01/23 appt noted: Received Spravato 84 mg today as scheduled.  Tolerated it well without nausea or vomiting headache or chest pain or palpitations.  Expected dissociation gradually resolved over the 2 hour observation period. She feels 50% less depressed with Spravto and wants to continue it.   Continues meds Adderall XR 20 mg every morning, stopped Wellbutrin XL 150 every morning, clonidine 0.2 mg twice daily, lorazepam 1 mg every 8 hours as needed.  Trazodone 50 mg tablets 1-2 nightly as needed insomnia Has started nortriptyline 75 mg nightly for about 3 weeks and DT level 176, reduced to 50 mg HS early November. Forgot to reduce pramipexole stoll taking  0.5 mg twice daily off  label for treatment resistant depression  Started Auvelity 1 AM & PM last week. Occ misses Spravato DT htn.this week a better than last.   Painting again more and it helps mood.  Confidence still low and not as likely to socialize as normal but enjoys family.   Still ambivalent about [place]. Tolerating meds.  Including the increase in [place].    01/08/23 appt noted: Received Spravato 84 mg today as scheduled.  Tolerated it well without nausea or vomiting headache or chest pain or palpitations.  Expected dissociation gradually resolved over the 2 hour observation period. She feels 50% less depressed with Spravto and wants to continue it.   Continues meds Adderall XR 20 mg every morning, stopped Wellbutrin XL 150 every morning, clonidine 0.2 mg twice daily, lorazepam 1 mg every 8 hours as needed.  Trazodone 50 mg tablets 1-2 nightly as needed insomnia Has started nortriptyline 75 mg nightly for about 3 weeks and DT level 176, reduced to 50 mg HS early November. reduced pramipexole to 0.5 mg daily off label for treatment resistant depression  Started Auvelity 1 AM & PM mid Feb. More depressed as week progresses.  Thinks it is worse with less pramipexole.  More negative.  Able to function but feels miserable.  No SI.  Hopeless.  Sleep ok.  Tolerating meds.   Talked to [facility] about [place] and appt mid March. Plan: increase pramipexole to 0.5 mg TID for dep bc seemed to worsen with the reduction.  01/15/23 appt noted: Received Spravato 84 mg today as scheduled.  Tolerated it well without nausea or vomiting headache or chest pain or palpitations.  Expected dissociation gradually resolved over the 2 hour observation period. She feels 50% less depressed with Spravto and wants to continue it.   Continues meds Adderall XR 20 mg every morning, stopped Wellbutrin XL 150 every morning, clonidine 0.2 mg twice daily, lorazepam 1 mg every 8 hours as needed.  Trazodone 50 mg tablets 1-2 nightly as needed  insomnia Has started nortriptyline 75 mg nightly for about 3 weeks and DT level 176, reduced to 50 mg HS early November. Increased pramipexole to 0.5 mg  to TID  off label for treatment resistant depression  Started Auvelity 1 AM & PM mid Feb. markedly better depression the increase in pramipexole.  She feels like her depression is almost resolved.  She is very pleased with the response.  She is tolerating the medications well  01/30/23 appt noted: Received Spravato 84 mg today as scheduled.  Tolerated it well without nausea or vomiting headache or chest pain or palpitations.  Expected dissociation gradually resolved over the 2 hour observation period. She feels 50% less depressed with Spravto and wants to continue it.   Continues meds Adderall XR 20 mg every morning, stopped Wellbutrin XL 150 every morning, clonidine 0.2 mg twice daily, lorazepam 1 mg every 8 hours as needed.  Trazodone 50 mg tablets 1-2 nightly as needed insomnia Has started nortriptyline 75 mg nightly for about 3 weeks and DT level 176, reduced to 50 mg HS early November. Increased pramipexole to 0.5 mg  to TID  off label for treatment resistant depression  Started Auvelity 1 AM & PM mid Feb. Mood markedly better with pramipexole added.  Nearly normal mood now with minimal depression.  More social and outgoing and motivated and resolved anhedonia. No SE.  Compliant.  02/04/23 appt noted: Continues meds Adderall XR 20 mg every morning, stopped Wellbutrin XL 150 every morning, clonidine 0.2 mg twice daily, lorazepam 1 mg every 8 hours as needed.  Trazodone 50 mg tablets 1-2 nightly as needed insomnia Has started nortriptyline 75 mg nightly for about 3 weeks and DT level 176, reduced to 50 mg HS early November. Increased pramipexole to 0.5 mg  to TID  off label for treatment resistant depression  Started Auvelity 1 AM & PM mid Feb. Received Spravato 84 mg today as scheduled.  Tolerated it well without nausea or vomiting headache  or chest pain or palpitations.  Expected dissociation gradually resolved over the 2 hour observation period. She feels 50% less depressed with Spravto and wants to continue it.  The pramipexole got her the rest of the way better.  Doesn't want to cut back on any tx bc afraid of relapse. Tolerating meds.  ECT-MADRS    Flowsheet Row Clinical Support from 01/08/2023 in [facility] from 08/06/2022 in [place] from 07/04/2022 in [facility] from 05/21/2022 in [facility] Office Visit from 03/02/2022 in [facility]  MADRS Total Score 21 29 15 27  46        Past Psychiatric Medication Trials: fluoxetine, duloxetine, Viibryd, Pristiq, sertraline, citalopram,  Trintellix anxious and SI Wellbutrin XL 450 Auvelity 1 dose nortriptyline 75 mg nightly for about 3 weeks and DT level 176, reduced to 50 mg HS NR. Pramipexole 1 mg NR Adderall, Adderall XR, Vyvanse, Ritalin, Strattera low dose NR Lorazepam Trazodone  Depakote,  lamotrigine cog complaints Lithium remotely Abilify 7.5  Vraylar 1.5 mg daily agitation and insomnia Rexulti insomnia Latuda 40 one dose, CO anxious and SI Seroquel XR 300 Olanzapine 10  At visit November 12, 2019. We discussed Patient developed an increasingly severe alcohol dependence problem since her last visit in January.  She went to [facility] and has had no alcohol since then except 1 day.  She never abused stimulants but they took her off the stimulants at [facility].  Her ADD was markedly worse.  The Wellbutrin did not help the ADD.   D history lamotrigine rash at 67 yo  Review of Systems:  Review of Systems  Constitutional:  Negative for fatigue.  Musculoskeletal:  Positive for back pain. Negative for arthralgias and joint swelling.       SP hip surgery October  2020  Neurological:  Negative for dizziness and tremors.  Psychiatric/Behavioral:  Positive for decreased concentration. Negative for agitation, behavioral problems, confusion, dysphoric mood, hallucinations, self-injury, sleep disturbance and suicidal ideas. The patient is nervous/anxious. The patient is not hyperactive.        Forgetful at times about med recommendations.  Send present for him The only [place] she notices a it states it is unbelievable him how how much are her 3 steps around freedom of the present fall and just in the past decade  Medications: I have reviewed the patient's current medications.  Current Outpatient Medications  Medication Sig Dispense Refill   amLODipine (NORVASC) 2.5 MG tablet Take 2.5 mg by mouth daily.     amphetamine-dextroamphetamine (ADDERALL XR) 20 MG 24 hr capsule Take 1 capsule (20 mg total) by mouth every morning. 30 capsule 0   amphetamine-dextroamphetamine (ADDERALL XR) 20 MG 24 hr capsule Take 1 capsule (20 mg total) by mouth every morning. 30 capsule 0   buPROPion (WELLBUTRIN XL) 150 MG 24 hr tablet TAKE 3 TABLETS BY MOUTH DAILY (Patient taking differently: Take 150 mg by mouth daily.) 270 tablet 1   cloNIDine (CATAPRES) 0.2 MG tablet TAKE 1 TABLET BY MOUTH 2 TIMES DAILY. 180 tablet 0   Dextromethorphan-buPROPion ER (AUVELITY) 45-105 MG TBCR Take 1 tablet by mouth 2 (two) times daily. 60 tablet 1   Esketamine HCl, 84 MG Dose, (SPRAVATO, 84 MG DOSE,) 28 MG/DEVICE SOPK USE 3 SPRAYS IN EACH NOSTRIL ONCE A WEEK 3 each 1   iron polysaccharides (NIFEREX) 150 MG capsule TAKE 1 CAPSULE BY MOUTH EVERY DAY 90 capsule 1   LORazepam (ATIVAN) 1 MG tablet Take 1 tablet (1 mg total) by mouth every 8 (eight) hours as needed. for anxiety 90 tablet 1   losartan (COZAAR) 50 MG tablet Take 50 mg by mouth daily.     nebivolol (BYSTOLIC) 2.5 MG tablet Take 2.5 mg by mouth daily.     nortriptyline (PAMELOR) 25 MG capsule Take 2 capsules (50 mg total) by mouth at bedtime.  180 capsule 0   pramipexole (MIRAPEX) 0.5 MG tablet Take 1 tablet (0.5 mg total) by mouth 3 (three) times daily. 90 tablet 0   traZODone (DESYREL) 50 MG tablet TAKE 1-2 TABLETS BY MOUTH NIGHTLY AS NEEDED FOR SLEEP 180 tablet 1   No current facility-administered medications for this visit.    Medication Side Effects: None  Allergies:  Allergies  Allergen Reactions   Metronidazole Shortness Of Breath and Other (See Comments)    Heart pounding   Ferrlecit [Na Ferric Gluc Cplx In Sucrose] Other (See Comments)    Infusion reaction 05/12/2019    Past Medical History:  Diagnosis Date   ADHD    Anemia    Anxiety    Arthritis    Depression    Heart murmur    i went to see a cardiologit slast eyar  and i had zero plaque,    PONV (postoperative nausea and vomiting)    Recovering alcoholic in remission ([place])     Family History  Problem Relation Age of Onset   Atrial fibrillation Mother    CAD Father     Past Medical History, Surgical history, Social history, and Family history were reviewed and updated as appropriate.   Please see review of systems for further details  on the patient's review from today.   Objective:   Physical Exam:  There were no vitals taken for this visit.  Physical Exam Constitutional:      General: She is not in acute distress. Neurological:     Mental Status: She is alert and oriented to person, place, and time.     Coordination: Coordination normal.     Gait: Gait normal.  Psychiatric:        Attention and Perception: Attention and perception normal.        Mood and Affect: Mood is anxious. Mood is not depressed. Affect is not labile or tearful.        Speech: Speech is not rapid and pressured.        Behavior: Behavior is not slowed.        Thought Content: Thought content is not paranoid or delusional. Thought content does not include homicidal or suicidal ideation. Thought content does not include suicidal plan.        Cognition and Memory:  Cognition normal. Memory is not impaired. She does not exhibit impaired recent memory.        Judgment: Judgment normal.     Comments: Insight intact. No auditory or visual hallucinations. No delusions.  Depression resolved with pramipexole TID     Lab Review:     Component Value Date/Time   NA 137 01/12/2021 1430   NA 140 11/18/2018 1544   K 3.8 01/12/2021 1430   CL 108 01/12/2021 1430   CO2 22 01/12/2021 1430   GLUCOSE 94 01/12/2021 1430   BUN 14 01/12/2021 1430   BUN 20 11/18/2018 1544   CREATININE 0.82 01/12/2021 1430   CALCIUM 8.9 01/12/2021 1430   PROT 6.6 01/12/2021 1430   ALBUMIN 3.9 01/12/2021 1430   AST 12 (L) 01/12/2021 1430   ALT 11 01/12/2021 1430   ALKPHOS 46 01/12/2021 1430   BILITOT 0.5 01/12/2021 1430   GFRNONAA >60 01/12/2021 1430   GFRAA >60 09/02/2019 0249   GFRAA >60 01/27/2019 0811       Component Value Date/Time   WBC 4.5 01/12/2021 1430   RBC 4.32 01/12/2021 1430   HGB 12.8 01/12/2021 1430   HGB 12.9 07/17/2019 0953   HCT 38.5 01/12/2021 1430   HCT 21.9 (L) 12/25/2018 1221   PLT 272 01/12/2021 1430   PLT 286 07/17/2019 0953   MCV 89.1 01/12/2021 1430   MCH 29.6 01/12/2021 1430   MCHC 33.2 01/12/2021 1430   RDW 12.4 01/12/2021 1430   LYMPHSABS 1.4 01/12/2021 1430   MONOABS 0.4 01/12/2021 1430   EOSABS 0.0 01/12/2021 1430   BASOSABS 0.0 01/12/2021 1430    No results found for: "POCLITH", "LITHIUM"   No results found for: "PHENYTOIN", "PHENOBARB", "VALPROATE", "CBMZ"   .res Assessment: Plan:    Recurrent major depression resistant to treatment ([place])  Generalized anxiety disorder  Attention deficit hyperactivity disorder (ADHD), predominantly inattentive type  Insomnia due to mental condition  Accelerated hypertension   She has treatment resistant major depression ongoing with 50% better with Spravato. Have  discussed some of her  abnormal behaviors last year leading to this depressive episode getting worse which she says were  associated with heavy use of delta 8 and not a manic episode.  She realizes now that that was not good for her.  She stopped all use of other drugs including those available over-the-counter such as delta 8 or any other THC related products.  She is no longer having any of  those types of behaviors and instead is depressed.  She is experiencing more pleasure and is less blunted and enjoying more and better inteest versus before the Spravato.  She also feels worse if misses a dose of Spravato.  Depression resolved 2 weeks ago, after increasing pramipexole 0.5 mg TID  Patient was administered Spravato 84 mg intranasally today.  The patient experienced the typical dissociation which gradually resolved over the 2-hour period of observation.  There were no complications.  Specifically the patient did not have nausea or vomiting or headache.  Blood pressures monitored at the 40-minute and 2-hour follow-up intervals.  Borderline high.  By the time the 2-hour observation period was met the patient was alert and oriented and able to exit without assistance.  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  See nursing note for further details.She wants to continue Spravato. We discussed discussed the side effects in detail as well as the protocol required to receive Spravato.   Failed multiple antidepressants.  Many of them were not actual failures but intolerances and it is unclear whether some of that was more connected with anxiety than true side effects.  1 example is the [place].  In general she does not want to try anything but an antidepressant but has failed all major categories of antidepressants except TCAs and MAO inhibitors which have not been tried until now.  Started nortriptyline 75 mg nightly.  Serum level 176.  So Reduced to 50 mg HS  Discussed side effects in detail.  Needs more time to help. Extensive discussion  previously about her ambivalence about meds and missatributing sx of depression as SE of meds.    She is tolerating Auvelity.  Continue 1  BID for at least 2 more weeks.  Worse with less pramipexole. Increased to 0.5 mg TID on about end of Feb 2024 and markedly better Dosing range off label for depression ranges from 1-5 mg daily.  Disc risk compulsive impulsive behavior and manis.    Started Spravato 84 mg twice weekly on 03/16/2022.  Now on weekly administration  Adderall  XR 20 mg AM   Ativan 1 mg 3 times daily as needed anxiety but try to cut it back. Is not ideal to use benzodiazepine with stimulant but because of the severity of her symptoms it has been necessary.  Hope to eventually eliminate the benzodiazepine.  Expected as her depression improves her anxiety will improve as well.  However lately her anxiety has been unmanageable.  We will expect that to improve as the depression improves.  She has headed insturctions to reduce this.  Continue clonidine 0.2 mg BID off label for anxiety and helps BP partially. BP is better controlled but not consistent.  Consider increasing amlodipine.  Also on losartan 50  Discussed potential benefits, risks, and side effects of stimulants with patient to include increased heart rate, palpitations, insomnia, increased anxiety, increased irritability, or decreased appetite.  Instructed patient to contact office if experiencing any significant tolerability issues. She wants to return to usual dose of Adderall for ADD bc of mor poor cognitive function with reduction.  Also discussed that depression will impair cognitive function.  Disc risk polypharmacy  Rec keep track of BP and discuss with PCP. It has been up and down.  Sometimes needs extra clonidine before Spravato  Has Maintained sobriety  FU with Spravato weekly   Lynder Parents, MD, DFAPA     Please  see After Visit Summary for patient specific instructions.  No future  appointments.                 No orders of the defined types were placed in this encounter.      -------------------------------

## 2023-02-05 ENCOUNTER — Encounter: Payer: 59 | Admitting: Psychiatry

## 2023-02-05 NOTE — Progress Notes (Signed)
NURSES NOTE:   Patient arrived for her weekly 84mg  Spravato treatment. Pt is being treated for Treatment Resistant Depression this is #53 treatment, pt will be receiving 84 mg which will continue to be her maintenance dose, she receives weekly treatments.  Patient arrived and taken to treatment room. Confirmed she had a ride home which is her husband would be coming back to pick up pt when done and sometimes she needs to use [facility] if he is unable to pick her up. Pt's Spravato is ordered through [employer] and delivered to office, all Spravato medication is stored at doctors office per REMS/FDA guidelines. The medication is required to be locked behind two doors per FDA/REMS Protocol. Medication is also disposed of properly per regulations. Pt's prior authorization for Spravato 84 mg was renewed with an approval through 07/17/2023 with [facility].    Began taking patient's vital signs at 1:15 PM 133/92, pulse 59. Pulse Ox 99%. Gave patient first dose 28 mg nasal spray, each nasal spray administered in each nostril as directed and waited 5 minutes between the second and third dose. All 3 doses given pt did not complain of any nausea/vomiting, given a cup of water due to the taste after the administration of Spravato.  She listens to [employer] with spa or relaxing music.  Checked 40 minute vitals at 2:00 PM, 106/74, pulse 68, Pulse Ox 92%. Explained she would be monitored for a total time of 120 minutes. Discharge vitals were taken at 3:00 PM 96/73 P 68, 99% Pulse Ox. Dr. Clovis Pu met with pt today and discussed her medication. I walked pt to elevator, where her husband met her for the ride home. Recommend she go home and sleep or just relax on the couch. No driving, no intense activities. Verbalized understanding. Nurse was with pt a total of 70 minutes for clinical. Pt will be scheduled next Monday, April 1st. Pt instructed to call office with any problems or questions.      LOT [id number] EXP ID:[id number]

## 2023-02-06 ENCOUNTER — Other Ambulatory Visit: Payer: Self-pay | Admitting: Psychiatry

## 2023-02-07 ENCOUNTER — Other Ambulatory Visit: Payer: Self-pay

## 2023-02-07 DIAGNOSIS — F339 Major depressive disorder, recurrent, unspecified: Secondary | ICD-10-CM

## 2023-02-07 MED ORDER — PRAMIPEXOLE DIHYDROCHLORIDE 0.5 MG PO TABS: 0.5000 mg | ORAL_TABLET | Freq: Three times a day (TID) | ORAL | 1 refills | Status: DC

## 2023-02-11 ENCOUNTER — Ambulatory Visit (INDEPENDENT_AMBULATORY_CARE_PROVIDER_SITE_OTHER): Payer: 59 | Admitting: Psychiatry

## 2023-02-11 ENCOUNTER — Ambulatory Visit: Payer: 59

## 2023-02-11 VITALS — BP 87/57 | HR 57

## 2023-02-11 DIAGNOSIS — F339 Major depressive disorder, recurrent, unspecified: Secondary | ICD-10-CM | POA: Diagnosis not present

## 2023-02-11 DIAGNOSIS — F411 Generalized anxiety disorder: Secondary | ICD-10-CM

## 2023-02-11 DIAGNOSIS — F5105 Insomnia due to other mental disorder: Secondary | ICD-10-CM

## 2023-02-11 DIAGNOSIS — I10 Essential (primary) hypertension: Secondary | ICD-10-CM

## 2023-02-11 DIAGNOSIS — F900 Attention-deficit hyperactivity disorder, predominantly inattentive type: Secondary | ICD-10-CM | POA: Diagnosis not present

## 2023-02-13 ENCOUNTER — Other Ambulatory Visit: Payer: Self-pay | Admitting: Psychiatry

## 2023-02-14 ENCOUNTER — Other Ambulatory Visit: Payer: Self-pay

## 2023-02-14 DIAGNOSIS — F339 Major depressive disorder, recurrent, unspecified: Secondary | ICD-10-CM

## 2023-02-14 MED ORDER — AUVELITY 45-105 MG PO TBCR
1.0000 | EXTENDED_RELEASE_TABLET | Freq: Two times a day (BID) | ORAL | 1 refills | Status: DC
Start: 2023-02-14 — End: 2023-02-18

## 2023-02-15 ENCOUNTER — Encounter: Payer: Self-pay | Admitting: Psychiatry

## 2023-02-15 NOTE — Progress Notes (Signed)
Laura Chang [id number] 04/16/1956 67 y.o.  Subjective:   Patient ID:  Laura Chang is a 67 y.o. (DOB 01/16/1956) female.  Chief Complaint:  Chief Complaint  Patient presents with   Follow-up   Depression   Fatigue   ADD      Laura Chang  Laura Chang presents to the office today for follow-up of depression and anxiety and ADD.  seen November 12, 2019.  Melted down in 2020.  [facility] in July.  No withdrawal.  1 drink since then.  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 She was on no stimulant and no SSRI but was taking Strattera and Wellbutrin.  The following changes were made. Stop Strattera. OK restart stimulant bc severe ADD Restart Adderall 1 daily for a few days and if tolerated then restart 1 twice daily. If not tolerated reduce the dosage if needed. May need to stop Wellbutrin if not tolerating the stimulant.  Yes.  DC Wellbutrin Restart Prozac 20 mg daily.  February 2021 appointment with the following noted: Completed grant proposal.  Couldn't doit without Adderall.  Sold a bunch of work.   Adderall XR lasts about 3 pm.  Strength seems about right.  BP been OK.  Not jittery.   Stopped Wellbutrin but had no SE. Mood drastically better with grant proposal and back on fluoxetine.  Less depressed and lethargic.  No anxiety.  Cut back on coffee. Started back with devotions and stronger faith. Plan: Continue Prozac 20 mg daily. May have to increase the dose at some point in the future given that she usually was taking higher dosages but she is getting good response at this time. Restart Wellbutrin off label for ADD since can't get 2 ADDERALL daily. 150 mg daily then 300 mg daily. She can adjust the dose between 150 mg and 300 mg daily to get the optimal effect.   05/11/2020 appointment with the following noted: Has been inconsistent with Prozac and Wellbutrin. Not sure of the effect of Wellbutrin. Biggest deterrent in work is anxiety.   Some of the work is conceptual and difficult at times.  Can feel she's not up to a project at times.  Overall is OK but would like a steadier benefit from stimulant.  Exhausted from managing concentration and keeping up with things from the day.  Loses things.  Not good keeping up with schedule. Overall productive and emotionally OK. Can feel Adderall wear off. Mood is better in summer and worse in the winter.   F died in November and that is a loss. No SE Wellbutrin. Still attends AA meetings.  [facility]. Recognizes effect of anemia on ADD and mood.  Had iron infusions last winter. Plan:  Wellbutrin off label for ADD since can't get 2 ADDERALL daily. 150 mg daily then 300 mg daily.  01/24/2021 appointment with following noted: Doing a program called [employer] mindfulness app since Xmas.  CBT app helped the depression.  App helped her focus better.  Lost sign weight. Writing a lot. Before Xmas felt depressed and started negative thinking worse, self denigrating. Not drinking. More isolated.   Recognizes mo is narcissist.    Didn't tell anyone she was born until 3 mos later.  M aloof and uninterested in pt.  Lied about her birthday.  Mo lack of affection even with pt's kids. Going to AA for a year and it helped her to quit drinking. Also misses kids being gone  with a hole also. Plan: No med changes  05/04/2021 appointment with the following noted: Therapist Laura Chang thinks she's manic. Lost weight to 144#.   States she is still sleeping okay.  Admits she is hyper and recognizes that she is likely manic.  She feels great, euphoric with an increased sense of spiritual connectedness to God.  She has racing thoughts and talks fast and talks a lot and this is noted by her husband.  He thinks she is a bit hyper.  She has been able to maintain sobriety although she will have 1 glass of wine on special occasions but does not drink by herself.  She is not drinking to  excess.  She denies any dangerous impulsivity.  She is clearly not depressed and not particularly anxious.  She has no concerns about her medication and she has been compliant.  06/16/21 appt noted: So much better.  Going through a lot but the manic thing happened on top of it.  So much slower.  Didn't feel like losing anything with risperidone.  Likes the Adderalll at 10 mg. Some drowsiness in the AM and very drowsy from risperidone 2 mg HS. Prayer life is better. Handling stress better. Less depressed with risperidone. Still likes trazodone. Sleeps well. Plan: Reduce Prozac to 10 mg daily.  Consider stopping it because it can feel the mania however she is reluctant to do that because she fears relapse of depression. Reduce risperidone to 1.5 mg nightly due to side effects.  Discussed risk of worsening mania.  07/25/2021 appointment with the following noted: Misses the Adderall and hard to function without it. Depressed now. Heavy chest.  Anxious and guilty.  Body feels heavy.   Hates Wellbutrin.   Plan: Increase fluoxetine to 20 mg daily Add Abilify 1/2 of 15 mg tablet daily Wean wellbutrin by 1 tablet each week  bc she feels it is not helpful and DT polypharmacy Reduce risperidone to 1 daily for 1 week and stop it. Disc risk of mania. Increase Adderall to XR 20 mg AM  08/08/21 Much less depressed and starting to feel normal I feel a lot better. No SE.  Speech normal off risperidone. Sleeping OK on trazaodone and enough.   Noticed benefit from Adderall again. Plan: continue fluoxetine to 20 mg daily Continue Abilify 1/2 of 15 mg tablet daily for depression and mania continue Adderall to XR 20 mg AM  10/10/2021 phone call: Pt stated she feels like the Abilify should be decreased to 23m.She said she is depressed but rational and not suicidal.She has an appt Monday and can wait until then if you prefer. MD response: Reduce the Abilify to 7.5 mg every other day.  We will meet on 10/16/2021  and decide what to do from there.  10/16/2021 appointment with the following noted: More depressed.  Most depressed I've ever been.  Just numb.  Sense of grief.   Thinks the manic episode was unlike anything else she ever had.  Doesn't want to medicate against it.  Don't enjoy people.  Easily overwhelmed.  Had some death thoughts but not suicidal.  Has been functional.  Feels better today after reducing Abilify to every other day but she is only been doing that for 3 days. A/P: Episode of post manic depression was explained. continue fluoxetine to 20 mg daily Hold Abilify for 1 week then resume Abilify 1/2 of 15 mg tablet every other day for depression and mania continue Adderall to XR 20 mg AM  10/27/2021  appointment with the following noted: I'm doing so much better.  Handling the depression better. Better self talk and spiritual focus has helped.   Dep 6/10 manifesting as anxiety with low confidence.   F died 2  years ago and M 67 yo and is dependent . She is working hard to feel better but still feels depressed.  She almost feels like she has a little more anxiety since restarting Abilify every other day. Plan: continue fluoxetine to 20 mg daily DC Abilify .  Vrayalar 1.5 mg QOD to try to get rid of depression ASAP. continue Adderall to XR 20 mg AM  11/10/2021 appointment with the following noted: Busy with Xmas and it was fun with family but then a big let down.  Did well with it.  Functioned well with it.  Working hard on things with depression.  Not shutting down. Not sure but feels better today but yesterday was hard.  Difficulty dealing with mother.  She won't do anything to help herself.  Yesterday with her all day.  Won't do PT and has isolated herself.    Lack of confidence.   No SE with Vraylar.  11/24/21 urgent appointment appt noted: More and more depressed.   So anxious and doesn't want to be alone but can do so. No appetite. Hurts inside. Has had some fleeting suicidal  thoughts but would not act on them.  Tolerating meds. Has been consistent with Vraylar 1.5 mg every other day, fluoxetine 20 mg daily Plan: Increase Vraylar to 1.5 mg daily Change Prozac to Trintellix 10 mg daily. Discussed side effects of each continue Adderall to XR 20 mg AM  12/27/2021 appointment with the following noted: Not OK.  I feel less depressed but feels bat shit. Not sleeping well.  Extremely anxious. Off and on sleep. 3-4 hours of sleep.   Still having daily SI.  But also become obvious has so much to do.  Overwhelmed by tasks.   Needs anxiety meds to just function. Not more motivated.  Walked yesterday.   Feels afraid like in trouble but not irritable or angry. DC DT agitation Vraylar to 1.5 mg daily Change Prozac to Trintellix 10 mg daily. Hold Adderall to XR 20 mg AM Clonidine 0.1 1/2 tablet twice daily for 2 days and if needed for anxiety and sleep increase to 1 twice daily Ok temporary Ativan 1 mg 3 times daily as needed anxiety  01/05/22 appt noted: Off fluoxetine and  Trintellix.  Only on Ativan, trazodone and Adderall XR 20 plus added clonidine 0.1 mg BID Didn't think she needed to start Trintellix. Not taking Ativan.   Didn't like herself last week. Feels some better today. Wonders if the manic sx Not agitated.  Anxiety kind of calmed down.  A lot to be anxious about situationally.  $ stress. Concerns about downers with meds. Can't access normal personality. ? Lethargy and inability to talk as sE. Plan: Latuda 20-40 mg daily with food. Adderall to XR 20 mg AM Clonidine 0.1 1/2 tablet twice daily  reduce dose to be sure no SE Ok temporary Ativan 1 mg 3 times daily as needed anxiety  01/19/22 appt noted: Taking Latuda 20 mg daily.  Took 40 mg once and felt anxious and  SI Still depressed and not very reactive Anxiety mainly about the depression and fears of the future. She wants to revisit manic sx and thinks it was maybe bc taking delta 8 bc was taking a lot  of it so still  doesn't think she's classic bipolar. She wants to only take Prozac bc thinks Latuda is perpetuating depression. Says the delta 8 was very psychaedelic.  When not taking it was not manic.  Sleeping ok again.  Plan: Per her request DC Latuda 20-40 mg daily with food. She wants to continue Prozac alone AMA  Adderall to XR 20 mg AM Clonidine 0.1 1/2 tablet twice daily  reduce dose to be sure no SE Ok temporary Ativan 1 mg 3 times daily as needed anxiety  01/23/2022 phone call complaining of increased anxiety since stopping Latuda.  She will try increasing clonidine.  01/26/2022 phone call not feeling well and wanted to restart the Vraylar.  However notes indicate that had made her agitated therefore she was encouraged to pick up samples of Rexulti 1 mg and start that instead.  02/06/2022 phone call: Stating she felt the Rexulti was helping with depression but she was not sleeping well and obsessing over things.  She was encouraged to increase Rexulti to 2 mg daily and increase trazodone for sleep.  02/09/2022 appointment with the following noted: This was an urgent work in appointment No sleep last night with trazodone 100 mg HS Nothing really better depression or anxiety. Ruminating negative anxious thoughts. Did not tolerate Rexulti because it was causing insomnia.  Does not think it helped depression.  Lacks emotion that she should have.  Lacks her usual personality.  Some hopeless thoughts.  Some death thoughts.  Some suicidal thoughts without plan or intent Plan: DC Rexulti and Prozac & DC trazodone Adderall to XR 20 mg AM Clonidine 0.1 1/tablet twice daily  reduce dose to be sure no SE Ok temporary Ativan 1 mg 3 times daily as needed anxiety Start Seroquel XR 150 mg nightly  03/02/2022 appointment: Laura Chang called back a few days after starting Seroquel stating it was making her more anxious and more depressed.  This seemed unlikely as this medicine rarely ever causes anxiety.   She stopped the medication waited 3 days and called back still had anxiety and depression but thought perhaps the anxiety was a little better.  She did not want to take the Seroquel. She knew about the option of Spravato and wanted to pursue that. Now questions whether to return to Seroquel while waiting to start Spravato bc feels just as bad without it and knows she didn't give it enough time to work.   MADRS 46  ECT-MADRS    Flowsheet Row Clinical Support from 01/08/2023 in [place] from 08/06/2022 in [place] from 07/04/2022 in [place] from 05/21/2022 in [place] Office Visit from 03/02/2022 in [facility]  MADRS Total Score '21 29 15 27 '$ 46      03/14/22 appt noted: Pt received Spravato 56 mg first dose today with some dissociative sx which were not severe.  She was anxious prior to the administration and felt better after receiving lorazepam 1 mg.  No NV, or HA. Wants to continue Spravato. Ongoing depression and desperate to feel better.  I'm not myself DT deprsssion which is most severe in recent history.  Anhedonia.  Low motivation.  Social avoidance. Continues to think all recent med trials are making her worse.  Sleep ok with Seroquel.  03/16/22 appt noted: Received Spravato 84 mg for the first time.  some dissociative sx which were not severe.  She was anxious prior to  the administration and felt better after receiving lorazepam 1 mg.  No NV, or HA. Wants to continue Spravato.   Does not feel any better or different since the last appt.  Ongoing depression.  Ongoing depression and desperate to feel better.  I'm not myself DT deprsssion which is most severe in recent history.  Anhedonia.  Low motivation.  Social avoidance. Continues to think all recent med trials are making her worse.  Sleep  ok with Seroquel.  Does not want to continue Seroquel for TRD.  03/20/2022 appointment noted: Came for Spravato administration today.  However blood pressure was significantly elevated approximately 180/115.  She was given lorazepam 1 mg and clonidine 0.2 mg to try to get it down. She states she regretted stopping the Seroquel XR 300 mg tablets.  She now realizes it was helpful.  She did not sleep much at all last night.  She did not take the Adderall this morning. 2 to 3 hours after arrival blood pressure was still elevated at  170/110, 62 pulse.  For Spravato administration was canceled for today.  She admits to being anxious and depressed.  She is not suicidal.  She is highly motivated to receive the Spravato.  We discussed getting it tomorrow.  03/22/2022 appointment noted: Patient's blood pressure was never stable enough yesterday in order to get her in for Spravato administration.  She was encouraged to see her primary care doctor.  It is better today.  03/26/2022 appointment with the following noted: Blood pressure was better.  Saw her primary care doctor who started on oral Bystolic 2.5 mg daily. Received Spravato 84 mg today as scheduled.  Tolerated it well without nausea or vomiting headache or chest pain or palpitations.  Her blood pressure was borderline but manageable. She remains depressed and anxious.  She is ambivalent about the medicine and desperate to get to feel better.  Continues to have anhedonia and low energy and low motivation and reduced ability to do things.  Less social.  Not suicidal.  03/28/22 appt noted: Received Spravato 84 mg today as scheduled.  Tolerated it well without nausea or vomiting headache or chest pain or palpitations.  Her blood pressure was borderline but manageable. Has not seen any improvement so far.  Tolerating Seroquel.  Inconsistent with Bystolic and BP has been borderline high. Still depressed and anxious and anhedonia.  Low motivation, energy,  productivity. Taking quetiapine and tolerating XR 300 mg nightly.  04/04/22 appt noted: Received Spravato 84 mg today as scheduled.  Tolerated it well without nausea or vomiting headache or chest pain or palpitations.  Her blood pressure was borderline but manageable. Has not seen any improvement so far.  Tolerating Seroquel.   She still tends to think that the medications are making her worse.  She has said this about each of the recent psychiatric medicines including Seroquel.  However her husband thinks she is improved.  She also admits there is some improvement in productivity.  She still feels highly anxious.  She still does not enjoy things as normal.  She still feels desperate to improve as soon as possible. Has been taking Seroquel XR since 03/20/2022  04/10/22 appt noted: Received Spravato 84 mg today as scheduled.  Tolerated it well without nausea or vomiting headache or chest pain or palpitations.  Her blood pressure was borderline but manageable. Has not seen any improvement so far.  Tolerating Seroquel.  Doesn't like Seroquel bc she thinks it flattens here. 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  Tolerated it well without nausea or vomiting headache or chest pain or palpitations.  Her blood pressure was borderline but manageable. Has not seen any improvement so far.  Tolerating Seroquel.  Doesn't like Seroquel bc she thinks it flattens her. Received Spravato 84 mg today as scheduled.  Tolerated it well without nausea or vomiting headache or chest pain or palpitations.  Her blood pressure was borderline but manageable. Has not seen any improvement so far.  Tolerating Seroquel.  Doesn't like Seroquel bc she thinks it flattens here.  We discussed her ambivalence about it. She is starting Auvelity and has tolerated it  the last 2 days without side effect.  She still does not feel like herself and feels flat and not enjoying things with suppressed expressed emotion  04/17/2022 appointment with the following noted: Received Spravato 84 mg today as scheduled.  Tolerated it well without nausea or vomiting headache or chest pain or palpitations.  Her blood pressure was borderline but manageable. Has not seen any improvement so far.  Tolerating Seroquel.  Doesn't like Seroquel bc she thinks it flattens her. She has been tolerating the Auvelity 1 in the morning without side effects for about a week.  She has not noticed significant improvement so far.  She still feels depressed and flat and not herself.  Other people notice that she is flat emotionally.  She is not suicidal.  She does feel discouraged that she is not getting better yet.  04/19/2022 appointment noted: Has increased Auvelity to 1 twice daily for 2 days, continues quetiapine XR 300 mg nightly, clonidine 0.3 mg twice daily, lorazepam 1 mg twice daily for anxiety and Adderall XR 20 mg in the morning. No obious SE but she still thinks quetiapine XR is making her feel down.  But not sedated Received Spravato 84 mg today as scheduled.  Tolerated it well without nausea or vomiting headache or chest pain or palpitations.  Her blood pressure was borderline but manageable. She still feels quite anxious and feels it necessary to take both the clonidine and lorazepam twice a day to manage her anxiety.  She has been consistently down and flat and not herself until yesterday afternoon she noted an improvement in mood and feeling much more like herself with her normal personality reemerging.  She was quite depressed in the morning with very dark negative thoughts.  She did not have those dark negative thoughts this morning.  She had a lot of questions about medication and when she was expecting to be improved and why she has not shown improvement up to now.  04/23/22 appt  noted: Has increased Auvelity to 1 twice daily for 1 week, continues quetiapine XR 300 mg nightly, clonidine 0.3 mg twice daily, lorazepam 1 mg twice daily for anxiety and Adderall XR 20 mg in the morning. No obious SE but she still thinks quetiapine XR is making her feel down.  But not sedated Received Spravato 84 mg today as scheduled.  Tolerated it well without nausea or vomiting headache or chest pain or palpitations.  She is still depressed but admits better function and is able to enjoy social interactions. Tolerating meds.  Would like to feel better for sure. Not herself.  Flat. Plan increase Auvelity to 1 tab BID as planned and reduce Quetiapine to 1/2 of ER 300 mg  bc NR for depression.  04/25/2022 appointment with the following noted: clonidine 0.3 mg twice daily, lorazepam 1 mg twice daily for anxiety and Adderall XR 20 mg in the morning. Seroquel XR 300 HS No obious SE but she still thinks quetiapine XR is making her feel down.  But not sedated Received Spravato 84 mg today as scheduled.  Tolerated it well without nausea or vomiting headache or chest pain or palpitations.  Called yesterday with more anxiety.  Had increased Auvelity for 1 day and reduced Seroquel XR for 1 day.  Felt restless and fearful  05/01/2022 appointment noted: clonidine 0.3 mg twice daily, lorazepam 1 mg twice daily for anxiety and Adderall XR 20 mg in the morning. Seroquel XR 150 HS, Auvelity 1 BID Received Spravato 84 mg today as scheduled.  Tolerated it well without nausea or vomiting headache or chest pain or palpitations.  Nurse has noted patient has called multiple times sometimes asking the same question repeatedly.  It is unclear whether she is truly forgetful or is just anxious seeking reassurance. Patient acknowledges ongoing depression as well as some anxiety but states she has felt a little better in the last couple of days.  She has reduced the Seroquel to 150 mg at night and has increased Auvelity to 1  twice daily but only for 1 day.  So far she seems to be tolerating it.  05/03/22 appt noted: clonidine 0.2 mg twice daily, lorazepam 1 mg twice daily for anxiety and Adderall XR 20 mg in the morning. Seroquel XR 150 HS, Auvelity 1 BID BP high this am about 170/100 and received extra clonidine 0.2 mg and came to receive Spravato.  Not dizzy, no SOB, nor CP but BP is still high Could not receive Spravato today bc BP high and pulse low at 30 ppm. Still depressed and anxious. Plan: continue trial Auvelity with Spravato She needs to get BP and pulse managed  05/08/22 TC: RTC  H Michael NA and mailbox full.  Could not leave message.  Pt  -  talked to she and H on speaker. H worried over wife.  Vacant stare.  Slurs words at times.  Not smiling. Reduced enjoyment.  Depression.  Withdrawn from usual activities.  Some irritability.  Anxious. Disc her concerns meds are making her worse.  Extensive discussion about her treatment resistant status.  There is a consistent pattern of not taking the medicines long enough to get benefit because she believes the meds are making her worse.  However the symptoms she describes as side effects are exactly the same symptoms that she had prior to taking the medication RX for  the depression.  So it is not clear that these are actual side effects. This is true about the 2 most recent meds including Seroquel and Auvelity.  Recommend psychiatric consultation in hopes of improving her comfort level with taking prescribed medications for a sufficient length of time to provide benefit. Extensive discussion about ECT is the treatment of choice for treatment resistant depression.  Spravato may work if she can comply with consistency.  There are medication options but they take longer to work.   Plan:  Reduce clonidine to 0.1 mg BID DT bradycardia.  Talk with PCP about BP and low pulse problems which are interfering with her consistent compliance with Spravato.   Limit lorazepam to  3 -4  mg daily max. Excess use is the cause of slurring speech.  She must stop excess use or will have to stop the med. Stop Auvelity per  her request.  But she has only been on the full dose for a little over a week and clearly has not had time to get benefit from it.  She thinks maybe it is making her more anxious. Reduce Seroquel from 150XR to 50 -100 mg at night IR.  She couldn't sleep when stopped it completely. Will not start new antidepressant until her SE issues are resolved or not. Get second psych opinion from Yehuda Budd MD or another psychiatrist.  H's sister is therapist in [place], MD, [facility]  05/16/2022 appointment with the following noted: Received Spravato 84 mg today as scheduled.  Tolerated it well without nausea or vomiting headache or chest pain or palpitations.  She stopped Auvelity as discussed last week. On her own, without physician input, she restarted Wellbutrin XL 450 mg every morning today.  She had taken it in the past.  She feels jittery and anxious. She feels less depressed than she did last week.  But she is still depressed without her usual range of affect.  She still is less social and less motivated than normal. Her primary care doctor increased the dose of losartan Plan: Stop Seroquel Reduce Wellbutrin XL to 300 mg every morning.  Starting the dose at 450 every morning is likely causing side effects of jitteriness and it should not be started at that have a dosage. Recommend she not change meds on her own without MDM put  05/23/2022 appointment with the following noted: Received Spravato 84 mg today as scheduled.  Tolerated it well without nausea or vomiting headache or chest pain or palpitations.  Has not dropped seroquel XR 300 mg 1/2 tablet nightly bc couldn't sleep without it. Has not tried lower dose quetiapine 50 mg HS Still feels depressed.   BP is better managed so far, just saw PCP.  BP is better today and infact is low today. Dropped  clonidine as directed from 0.3 mg BID bc inadequate control of BP to 0.2 mg BID.  However she wants to increase it back to 0.3 mg twice daily because she feels it helped her anxiety better.  Wonders about increasing Wellbutrin for depression.  However she has only been on 300 mg a day for a week.  She was on 450 mg daily in the past.  06/06/22 appt noted: Received Spravato 84 mg today as scheduled.  Tolerated it well without nausea or vomiting headache or chest pain or palpitations.  She is still depressed and anxious.  She wants to try to stop the Seroquel but cannot sleep without some of it.  She is taking lorazepam 1 mg 4 times daily and still having a lot of anxiety.  She wants to increase clonidine back to 0.3 mg twice daily.  She hopes for more improvement She recently went for a second psychiatric opinion as suggested the results of that are pending.  06/11/22 appt noted: Received Spravato 84 mg today as scheduled.  Tolerated it well without nausea or vomiting headache or chest pain or palpitations.  She is still depressed and anxious. Without much change.  Still hopeless, anhedonia, reduced inteterest and motivation.  Tolerating meds. Disc concerns Spravato is not hleping much. Plan: stop Seroquel and start olanzapine 10 mg HS for TRD and anxiety.  06/13/2022 appointment noted: Received Spravato 84 mg today as scheduled.  Tolerated it well without nausea or vomiting headache or chest pain or palpitations.  She is still depressed and anxious. Without much change.  Still hopeless, anhedonia, reduced inteterest  and motivation.  Tolerating meds. Disc concerns Spravato is not helping much as hoped but is improving a bit in the last week. Tolerating meds. Continues Wellbutrin XL 450 AM, tolerating recently started olanzapine  10 mg HS. Sleep is good.   Pending appt with [place] consult.  06/18/22 appt noted: Received Spravato 84 mg today as scheduled.  Tolerated it well without nausea or vomiting  headache or chest pain or palpitations.  Tolerating meds. Continues Wellbutrin XL 450 AM, tolerating recently started olanzapine  10 mg HS. Continues Adderall XR 20 amd and has tried to reduce lorazepam to '1mg'$  TID Sleep is good.   Pending appt with [place] consult. Depression is a little bit better in the last week with a little improvement in emotional expression and interest.  She is pushing herself to be more active.  Her daughter thought she was a little better than she has been.  However she is still depressed and still not her normal self with anhedonia and reduced emotional expressiveness.  06/20/22 appt noted: Received Spravato 84 mg today as scheduled.  Tolerated it well without nausea or vomiting headache or chest pain or palpitations.  Tolerating meds with a little sleepiness. Continues Wellbutrin XL 450 AM, tolerating recently started olanzapine  10 mg HS. Continues Adderall XR 20 amd and has tried to reduce lorazepam to '1mg'$  TID Sleep is good.   Mood is improving.  Better funciton.  Anxiety is better with olanzapine. Still not herself and depression not gone with some anhedonia and social avoidance and feeling overwhelmed.  8/14 2023 received Spravato 84 mg 06/27/2022 received Spravato Spravato 84 mg 07/02/2022 received Spravato 84 mg 07/04/2022 received Spravato 84 mg  07/09/2022 appointment noted: Received Spravato 84 mg today as scheduled.  Tolerated it well without nausea or vomiting headache or chest pain or palpitations.  Expected dissociation and feels less depressed with resolution of negative emotions immediately after Spravato and then depression, anxiety creep back in. Continues meds Adderall XR 20 mg every morning, Wellbutrin XL 450 every morning, clonidine 0.1 mg twice daily, lorazepam 1 mg every 6 hours as needed, olanzapine increased from 7.5 to 10 mg nightly on  Tolerating meds.  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  She does note some increased appetite and is somewhat concerned about that but has not gained significant amounts of weight. She has had the [place] consultation which was initially denied but she knows it can be appealed.  However because she is improving with Spravato plus the other medications now she wants to continue the current treatment plan.  07/18/22 appt noted: Continues meds Adderall XR 20 mg every morning, Wellbutrin XL 450 every morning, clonidine 0.1 mg twice daily, lorazepam 1 mg every 6 hours as needed, olanzapine increased from 7.5 to 10 mg nightly on 07/04/2022. Received Spravato 84 mg today as scheduled.  Tolerated it well without nausea or vomiting headache or chest pain or palpitations.  Expected dissociation and feels less depressed with resolution of negative emotions immediately after Spravato and then depression, anxiety creep back in. Continues meds Adderall XR 20 mg every morning, Wellbutrin XL 450 every morning, clonidine 0.1 mg twice daily, lorazepam 1 mg every 6 hours as needed, olanzapine increased from 7.5 to 10 mg nightly on  Tolerating meds.  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  She does note some increased appetite and is somewhat concerned about that but has not gained significant amounts of weight. She has had the [place] consultation which was initially denied but she knows it can be appealed. She continues to have chronic ambivalence about psychiatric medicines and initially tends to blame her depressive symptoms such as decreased concentration and feeling flat on what ever medicine she currently is taking even though she had the same symptoms before the current medicines were started.  Then after discussion she does admit that her depressive symptoms are improved since adding  olanzapine but still has those residual symptoms noted.  07/23/22 received Spravato 84 mg   07/30/2022 appointment noted: Received Spravato 84 mg today as scheduled.  Tolerated it well without nausea or vomiting headache or chest pain or palpitations.  Expected dissociation and feels less depressed with resolution of negative emotions immediately after Spravato and then depression, anxiety creep back in. Continues meds Adderall XR 20 mg every morning, Wellbutrin XL 450 every morning, clonidine 0.1 mg twice daily, lorazepam 1 mg every 6 hours as needed, olanzapine increased from 7.5 to 10 mg nightly on  She has been inconsistent with olanzapine because she continues to be ambivalent about the medications in general and thinks that perhaps the 10 mg is making her feel blunted.  She continues to feel some depression.  She had a good day this week and but still feels somewhat depressed and persistently anxious. Plan: be consistent with olanzapine 10 mg HS for TRD and longer trial for potential benefit for anxiety.  Has not taken it consistently.  08/06/2022 appointment noted: Received Spravato 84 mg today as scheduled.  Tolerated it well without nausea or vomiting headache or chest pain or palpitations.  Expected dissociation and feels less depressed with resolution of negative emotions immediately after Spravato and then depression, anxiety creep back in. Continues meds Adderall XR 20 mg every morning, Wellbutrin XL 450 every morning, clonidine 0.1 mg twice daily, lorazepam 1 mg every 6 hours as needed, olanzapine i 10 mg nightly  She continues to feel depressed but is about 50% better with Spravato.  She is still not herself.  She still has anhedonia.  She still is not her able to engage socially in the typical ways.  She is not jovial and outgoing like normal.  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  She continues to feel that her personality is dampened down.  There is a question about whether it is related to depression or medication. Plan: continue olanzapine 10 for longer trial for TRD and severe anxiety.  08/13/22 appt noted:  Received Spravato 84 mg today as scheduled.  Tolerated it well without nausea or vomiting headache or chest pain or palpitations.  Expected dissociation and feels less depressed with resolution of negative emotions immediately after Spravato and then depression, anxiety creep back in. Continues meds Adderall XR 20 mg every morning, Wellbutrin XL 450 every morning, clonidine 0.1 mg twice daily, lorazepam 1 mg every 6 hours as needed, olanzapine i 10 mg nightly  She still does not feel herself.  Still struggles with depression and low motivation and reduced social engagement and reduced interest and reduced emotional expression.  She is somewhat better with the medicines plus Spravato.  She still believes the Spravato makes her blunted and  is not sure how much it helps her anxiety.  She can have good days when her family is around and she is engaged.  She still wants to stop the olanzapine. She has apparently continued to take the trazodone despite having been told to stop it when she started olanzapine.  She feels like she needs the trazodone. Plan: DC olanzapine and Start nortriptyline 25 mg nightly and build up to 75 mg nightly and then check blood level.    08/27/2022 appointment noted: Received Spravato 84 mg today as scheduled.  Tolerated it well without nausea or vomiting headache or chest pain or palpitations.  Expected dissociation and feels less depressed with resolution of negative emotions immediately after Spravato and then depression, anxiety creep back in. Continues meds Adderall XR 20 mg every morning, Wellbutrin XL 450 every morning, clonidine 0.1 mg twice daily, lorazepam 1 mg every 6 hours as needed. Stopped olanzapine and started  nortriptyline which she has taken for about a week is 75 mg nightly. So far she is tolerating the nortriptyline well with the exception of some dry mouth and constipation which she is working to manage.  She does not feel substantially better better or different off the olanzapine.  No change in her sleep which is good.  Main concern currently in addition to the residual depression is anxiety which is somewhat situational with pending arch show.  She is worrying about it more than normal.  Says she is having to take lorazepam twice a day where she had been able to keep reduce it prior to this.  She still does not feel like herself with residual depression with less social interest and less of her usual buoyancy in personality.  She is flatter than normal.  Overall she still feels that the Spravato has been helpful at reducing the severity of the depression.  She is not suicidal. She has not heard anything about the [place] appeal as of yet.  09/05/2022 appointment noted: Received Spravato 84 mg today as scheduled.  Tolerated it well without nausea or vomiting headache or chest pain or palpitations.  Expected dissociation and feels less depressed with resolution of negative emotions immediately after Spravato and then depression, anxiety creep back in. Continues meds Adderall XR 20 mg every morning, Wellbutrin XL 450 every morning, clonidine 0.1 mg twice daily, lorazepam 1 mg every 6 hours as needed. Stopped olanzapine and started nortriptyline which she has taken for about 2 week is 75 mg nightly. Initially blood pressure was a little high causing delay in starting Spravato.  She admitted to feeling a little wound up.  She still experiences a little increase in depression if she goes longer than a week in between doses of Spravato.  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  09/10/22 appt noted: Received  Spravato 84 mg today as scheduled.  Tolerated it well without nausea or vomiting headache or chest pain or palpitations.  Expected dissociation gradually resolved over the 2 hour observation period. She feels 50% less depressed with Spravto and wants to continue it.   Continues meds Adderall XR 20 mg every morning, Wellbutrin XL 450 every morning, clonidine 0.1 mg twice daily, lorazepam 1 mg every 6 hours as needed. Has started nortriptyline 75 mg nightly for about 3 weeks. Has not seen a significant difference with the addition of nortriptyline.  Tolerating it pretty well. She continues  to have some degree of anhedonia and significant depression and anxiety.  Her daughters noticed that she is more needy and calls more frequently.  She acknowledges this as well.  She is clearly still not herself. Plan: pramipexole off label and RX 0.25 mg BID  09/17/2022 appointment noted: Received Spravato 84 mg today as scheduled.  Tolerated it well without nausea or vomiting headache or chest pain or palpitations.  Expected dissociation gradually resolved over the 2 hour observation period. She feels 50% less depressed with Spravto and wants to continue it.   Continues meds Adderall XR 20 mg every morning, Wellbutrin XL 450 every morning, clonidine 0.1 mg twice daily, lorazepam 1 mg every 6 hours as needed. Has started nortriptyline 75 mg nightly for about 3 weeks and DT level 176, reduced to 50 mg HS early November. Still the same sx as noted last visit.  Tolerating meds.   Compliant.  Still depressed and family notices.  Has been able to participate in family interactions.  Some post-show let down and has to do detailed work which is hard for her bc ADD.  Sleep and eating well.  Energy OK but not great.  No SI.  Not cried in a year or so.  Clearly less depressed and hopeless than before the Spravato.  09/24/22 appt noted: Received Spravato 84 mg today as scheduled.  Tolerated it well without nausea or vomiting  headache or chest pain or palpitations.  Expected dissociation gradually resolved over the 2 hour observation period. She feels 50% less depressed with Spravto and wants to continue it.   Continues meds Adderall XR 20 mg every morning, Wellbutrin XL 450 every morning, clonidine 0.1 mg twice daily, lorazepam 1 mg every 6 hours as needed. Has started nortriptyline 75 mg nightly for about 3 weeks and DT level 176, reduced to 50 mg HS early November. Still the same sx as noted last visit.  Tolerating meds.   Compliant.  Still depressed and family notices.  Has been able to participate in family interactions.  Some post-show let down and has to do detailed work which is hard for her bc ADD.  Sleep and eating well.  Energy OK but not great.  No SI.  Not cried in a year or so.  Clearly less depressed and hopeless than before the Spravato. Is not making further progress generally.  Stuck with moderate depression  10/02/22 appt noted: Received Spravato 84 mg today as scheduled.  Tolerated it well without nausea or vomiting headache or chest pain or palpitations.  Expected dissociation gradually resolved over the 2 hour observation period. She feels 50% less depressed with Spravto and wants to continue it.   Continues meds Adderall XR 20 mg every morning, Wellbutrin XL 450 every morning, clonidine 0.1 mg twice daily, lorazepam 1 mg every 6 hours as needed. Has started nortriptyline 75 mg nightly for about 3 weeks and DT level 176, reduced to 50 mg HS early November. Still the same sx as noted last visit.  Tolerating meds.   Compliant.  Still depressed and family notices.  Has been able to participate in family interactions.  Some post-show let down and has to do detailed work which is hard for her bc ADD.  Sleep and eating well.  Energy OK but not great.  No SI.  Not cried in a year or so.  Clearly less depressed and hopeless than before the Spravato. Is not making further progress generally.  Stuck with moderate  depression.  Is  able to function pretty normally. Plan: trial pramipexole 0.25 mg BID off label for depression.   10/08/22 appt noted: Received Spravato 84 mg today as scheduled.  Tolerated it well without nausea or vomiting headache or chest pain or palpitations.  Expected dissociation gradually resolved over the 2 hour observation period. She feels 50% less depressed with Spravto and wants to continue it.   Continues meds Adderall XR 20 mg every morning, Wellbutrin XL 450 every morning, clonidine 0.1 mg twice daily, lorazepam 1 mg every 6 hours as needed. Has started nortriptyline 75 mg nightly for about 3 weeks and DT level 176, reduced to 50 mg HS early November. Still the same sx as noted last visit.  Tolerating meds.   Compliant.  Still depressed and family notices.  Has been able to participate in family interactions.  Some post-show let down and has to do detailed work which is hard for her bc ADD.  Sleep and eating well.  Energy OK but not great.  No SI.  Not cried in a year or so.  Clearly less depressed and hopeless than before the Spravato. Is not making further progress generally.  Stuck with moderate depression.  Behaved and felt pretty normally with family over for Thanksgiving. Doesn't see benefit or SE with pramipexole but thinks maybe it makes her worse. Plan:  increase pramipexole augmentation off label to 0.5 mg BID  10/15/2022 appointment noted: Received Spravato 84 mg today as scheduled.  Tolerated it well without nausea or vomiting headache or chest pain or palpitations.  Expected dissociation gradually resolved over the 2 hour observation period. She feels 50% less depressed with Spravto and wants to continue it.   Continues meds Adderall XR 20 mg every morning, Wellbutrin XL 450 every morning, clonidine 0.2 mg twice daily, lorazepam 1 mg every 6 hours as needed.  Trazodone 50 mg tablets 1-2 nightly as needed insomnia Has started nortriptyline 75 mg nightly for about 3 weeks  and DT level 176, reduced to 50 mg HS early November. Recommended increase pramipexole 0.5 mg twice daily off label for treatment resistant depression on 10/08/2022. She feels better motivated more active with pramipexole 0.5 mg twice daily.  She still is depressed but it is better.  We discussed the possibility of going up in the dose but did not change it.  10/22/2022 appointment noted: Received Spravato 84 mg today as scheduled.  Tolerated it well without nausea or vomiting headache or chest pain or palpitations.  Expected dissociation gradually resolved over the 2 hour observation period. She feels 50% less depressed with Spravto and wants to continue it.   Continues meds Adderall XR 20 mg every morning, Wellbutrin XL 450 every morning, clonidine 0.2 mg twice daily, lorazepam 1 mg every 8 hours as needed.  Trazodone 50 mg tablets 1-2 nightly as needed insomnia Has started nortriptyline 75 mg nightly for about 3 weeks and DT level 176, reduced to 50 mg HS early November. pramipexole 0.5 mg twice daily off label for treatment resistant depression on 10/08/2022. She is better motivated than she was.  She is journaling 3 pages a day.  She has started walking and has walked 5 days a week for 50 minutes for the last 2 weeks.  That is significantly helped her mood.  Her mood tends to be better when she interacts with family.  However she still has some periods of depression.  She is tolerating the medications well.  She still feels like her affect and confidence is not  back to normal. Plan:  increase pramipexole augmentation off label to 0.5 mg tID or 0.75 mg BID   10/29/22 appt noted: Received Spravato 84 mg today as scheduled.  Tolerated it well without nausea or vomiting headache or chest pain or palpitations.  Expected dissociation gradually resolved over the 2 hour observation period. She feels 50% less depressed with Spravto and wants to continue it.   Continues meds Adderall XR 20 mg every  morning, Wellbutrin XL 450 every morning, clonidine 0.2 mg twice daily, lorazepam 1 mg every 8 hours as needed.  Trazodone 50 mg tablets 1-2 nightly as needed insomnia Has started nortriptyline 75 mg nightly for about 3 weeks and DT level 176, reduced to 50 mg HS early November. pramipexole increased to 0.75 mg twice daily off label for treatment resistant depression  She has been feeling somewhat better with the increase in pramipexole and is tolerating it well.  She still is easily overwhelmed.  Her affect and mood can improve now when around her family or doing something positive.  She has been able to be more productive. She is tolerating the current medications. She is still considering TMS as an alternative to Spravato.  11/15/2022 appointment noted: Received Spravato 84 mg today as scheduled.  Tolerated it well without nausea or vomiting headache or chest pain or palpitations.  Expected dissociation gradually resolved over the 2 hour observation period. She feels 50% less depressed with Spravto and wants to continue it.   Continues meds Adderall XR 20 mg every morning, Wellbutrin XL 450 every morning, clonidine 0.2 mg twice daily, lorazepam 1 mg every 8 hours as needed.  Trazodone 50 mg tablets 1-2 nightly as needed insomnia Has started nortriptyline 75 mg nightly for about 3 weeks and DT level 176, reduced to 50 mg HS early November. pramipexole increased to 0.75 mg twice daily off label for treatment resistant depression  He has noticed some increase in depression due to the length of time since the last Spravato administration.  She believes Spravato is helping her.  sHe is not suicidal but has felt very blue the last few days. She is tolerating the medication. She is ambivalent about Spravato versus [place] but she is considering [place]. She wants to continue Spravato administration because it is clearly helpful.  11/19/22 appt noted: Received Spravato 84 mg today as scheduled.  Tolerated it well  without nausea or vomiting headache or chest pain or palpitations.  Expected dissociation gradually resolved over the 2 hour observation period. She feels 50% less depressed with Spravto and wants to continue it.   Continues meds Adderall XR 20 mg every morning, Wellbutrin XL 450 every morning, clonidine 0.2 mg twice daily, lorazepam 1 mg every 8 hours as needed.  Trazodone 50 mg tablets 1-2 nightly as needed insomnia Has started nortriptyline 75 mg nightly for about 3 weeks and DT level 176, reduced to 50 mg HS early November. pramipexole increased to 0.75 mg twice daily off label for treatment resistant depression  She has felt better back on Spravato more regularly.  However she still has residual depression esp when alone or when wihtout activity.  Can function when needed.   Does not have her connfidence back. She plans to pursue [place] availability. Have discussed retrying [place]  11/28/22 appt noted: Received Spravato 84 mg today as scheduled.  Tolerated it well without nausea or vomiting headache or chest pain or palpitations.  Expected dissociation gradually resolved over the 2 hour observation period. She feels 50% less  depressed with Spravto and wants to continue it.   Continues meds Adderall XR 20 mg every morning, Wellbutrin XL 450 every morning, clonidine 0.2 mg twice daily, lorazepam 1 mg every 8 hours as needed.  Trazodone 50 mg tablets 1-2 nightly as needed insomnia Has started nortriptyline 75 mg nightly for about 3 weeks and DT level 176, reduced to 50 mg HS early November. pramipexole increased to 0.75 mg twice daily off label for treatment resistant depression  Last week she was more depressed than usual for reasons that are not clear.  She has not had a clear answer from Adrienne Mocha about [employer] [facility] options.  States that they have not returned her call.  She is asked about Auvelity retry in place of Wellbutrin.  Has still been walking.  12/03/22 appt noted: Received Spravato 84 mg today  as scheduled.  Tolerated it well without nausea or vomiting headache or chest pain or palpitations.  Expected dissociation gradually resolved over the 2 hour observation period. She feels 50% less depressed with Spravto and wants to continue it.   Continues meds Adderall XR 20 mg every morning, Wellbutrin XL 450 every morning, clonidine 0.2 mg twice daily, lorazepam 1 mg every 8 hours as needed.  Trazodone 50 mg tablets 1-2 nightly as needed insomnia started nortriptyline 75 mg nightly for about 3 weeks and DT level 176, reduced to 50 mg HS early November. pramipexole increased to 0.75 mg twice daily off label for treatment resistant depression  No SE .  Satisfied with meds. Depression is better in the last week.  Not sure why that is the case.  Still walking daily and that helps and journaling 3 pages daily.  Working on [employer], academic.  No changes desire.  Clearly benefits from Spravato but not 100%.  Still considering TMS.  12/10/22 appt noted: Received Spravato 84 mg today as scheduled.  Tolerated it well without nausea or vomiting headache or chest pain or palpitations.  Expected dissociation gradually resolved over the 2 hour observation period. She feels 50% less depressed with Spravto and wants to continue it.   Continues meds Adderall XR 20 mg every morning, Wellbutrin XL 450 every morning, clonidine 0.2 mg twice daily, lorazepam 1 mg every 8 hours as needed.  Trazodone 50 mg tablets 1-2 nightly as needed insomnia started nortriptyline 75 mg nightly for about 3 weeks and DT level 176, reduced to 50 mg HS early November. pramipexole increased to 0.75 mg twice daily off label for treatment resistant depression  No SE .   Depression was worse this week for no apparent reason.  She struggled being positive.  She has felt more discouraged.  She has felt more anxious.  She has had a hard time doing tasks.  She is interested in retrying the [place] which we had discussed previously. Plan: She agrees to  [facility].  To improve tolerability and reduce risk of side effects, Stop Wellbutrin and start Auvelity 1 in the morning for 1 week then 1 twice daily AndReduce pramipexole 0.5 mg BID and reduce nortriptyline to 50 mg HS  12/17/22 appt noted: Received Spravato 84 mg today as scheduled.  Tolerated it well without nausea or vomiting headache or chest pain or palpitations.  Expected dissociation gradually resolved over the 2 hour observation period. She feels 50% less depressed with Spravto and wants to continue it.   Continues meds Adderall XR 20 mg every morning, stopped Wellbutrin XL 150 every morning, clonidine 0.2 mg twice daily, lorazepam 1 mg every  8 hours as needed.  Trazodone 50 mg tablets 1-2 nightly as needed insomnia Has started nortriptyline 75 mg nightly for about 3 weeks and DT level 176, reduced to 50 mg HS early November. pramipexole 0.375 mg twice daily off label for treatment resistant depression with plan to stop Started Auvelity 1 AM Still depressed to moderate degree.  Tolerating Auvelity so far.  No other problems with meds.  Willing to give Auvelity a chance. Plan: She agrees to [facility].  Continue 1 AM and when tolerated then increase to BID Stop pramipexole.  12/26/22 received Spravato  01/01/23 appt noted: Received Spravato 84 mg today as scheduled.  Tolerated it well without nausea or vomiting headache or chest pain or palpitations.  Expected dissociation gradually resolved over the 2 hour observation period. She feels 50% less depressed with Spravto and wants to continue it.   Continues meds Adderall XR 20 mg every morning, stopped Wellbutrin XL 150 every morning, clonidine 0.2 mg twice daily, lorazepam 1 mg every 8 hours as needed.  Trazodone 50 mg tablets 1-2 nightly as needed insomnia Has started nortriptyline 75 mg nightly for about 3 weeks and DT level 176, reduced to 50 mg HS early November. Forgot to reduce pramipexole stoll taking  0.5 mg twice daily off  label for treatment resistant depression  Started Auvelity 1 AM & PM last week. Occ misses Spravato DT htn.this week a better than last.   Painting again more and it helps mood.  Confidence still low and not as likely to socialize as normal but enjoys family.   Still ambivalent about [place]. Tolerating meds.  Including the increase in [place].    01/08/23 appt noted: Received Spravato 84 mg today as scheduled.  Tolerated it well without nausea or vomiting headache or chest pain or palpitations.  Expected dissociation gradually resolved over the 2 hour observation period. She feels 50% less depressed with Spravto and wants to continue it.   Continues meds Adderall XR 20 mg every morning, stopped Wellbutrin XL 150 every morning, clonidine 0.2 mg twice daily, lorazepam 1 mg every 8 hours as needed.  Trazodone 50 mg tablets 1-2 nightly as needed insomnia Has started nortriptyline 75 mg nightly for about 3 weeks and DT level 176, reduced to 50 mg HS early November. reduced pramipexole to 0.5 mg daily off label for treatment resistant depression  Started Auvelity 1 AM & PM mid Feb. More depressed as week progresses.  Thinks it is worse with less pramipexole.  More negative.  Able to function but feels miserable.  No SI.  Hopeless.  Sleep ok.  Tolerating meds.   Talked to [facility] about [place] and appt mid March. Plan: increase pramipexole to 0.5 mg TID for dep bc seemed to worsen with the reduction.  01/15/23 appt noted: Received Spravato 84 mg today as scheduled.  Tolerated it well without nausea or vomiting headache or chest pain or palpitations.  Expected dissociation gradually resolved over the 2 hour observation period. She feels 50% less depressed with Spravto and wants to continue it.   Continues meds Adderall XR 20 mg every morning, stopped Wellbutrin XL 150 every morning, clonidine 0.2 mg twice daily, lorazepam 1 mg every 8 hours as needed.  Trazodone 50 mg tablets 1-2 nightly as needed  insomnia Has started nortriptyline 75 mg nightly for about 3 weeks and DT level 176, reduced to 50 mg HS early November. Increased pramipexole to 0.5 mg  to TID  off label for treatment resistant depression  Started Auvelity 1 AM & PM mid Feb. markedly better depression the increase in pramipexole.  She feels like her depression is almost resolved.  She is very pleased with the response.  She is tolerating the medications well  01/30/23 appt noted: Received Spravato 84 mg today as scheduled.  Tolerated it well without nausea or vomiting headache or chest pain or palpitations.  Expected dissociation gradually resolved over the 2 hour observation period. She feels 50% less depressed with Spravto and wants to continue it.   Continues meds Adderall XR 20 mg every morning, stopped Wellbutrin XL 150 every morning, clonidine 0.2 mg twice daily, lorazepam 1 mg every 8 hours as needed.  Trazodone 50 mg tablets 1-2 nightly as needed insomnia Has started nortriptyline 75 mg nightly for about 3 weeks and DT level 176, reduced to 50 mg HS early November. Increased pramipexole to 0.5 mg  to TID  off label for treatment resistant depression  Started Auvelity 1 AM & PM mid Feb. Mood markedly better with pramipexole added.  Nearly normal mood now with minimal depression.  More social and outgoing and motivated and resolved anhedonia. No SE.  Compliant.  02/04/23 appt noted: Continues meds Adderall XR 20 mg every morning, stopped Wellbutrin XL 150 every morning, clonidine 0.2 mg twice daily, lorazepam 1 mg every 8 hours as needed.  Trazodone 50 mg tablets 1-2 nightly as needed insomnia Has started nortriptyline 75 mg nightly for about 3 weeks and DT level 176, reduced to 50 mg HS early November. Increased pramipexole to 0.5 mg  to TID  off label for treatment resistant depression  Started Auvelity 1 AM & PM mid Feb. Received Spravato 84 mg today as scheduled.  Tolerated it well without nausea or vomiting headache  or chest pain or palpitations.  Expected dissociation gradually resolved over the 2 hour observation period. She feels 50% less depressed with Spravto and wants to continue it.  The pramipexole got her the rest of the way better.  Doesn't want to cut back on any tx bc afraid of relapse. Tolerating meds.  02/15/23 appt noted: Continues meds Adderall XR 20 mg every morning, stopped Wellbutrin XL 150 every morning, clonidine 0.2 mg twice daily, lorazepam 1 mg every 8 hours as needed.  Trazodone 50 mg tablets 1-2 nightly as needed insomnia Has started nortriptyline 75 mg nightly for about 3 weeks and DT level 176, reduced to 50 mg HS early November. Increased pramipexole to 0.5 mg  to TID  off label for treatment resistant depression  Started Auvelity 1 AM & PM mid Feb. Received Spravato 84 mg today as scheduled.  Tolerated it well without nausea or vomiting headache or chest pain or palpitations.  Expected dissociation gradually resolved over the 2 hour observation period. She feels no longer depressed.   The pramipexole got her the rest of the way better.  Doesn't want to cut back on any tx bc afraid of relapse. Tolerating meds.    ECT-MADRS    Flowsheet Row Clinical Support from 01/08/2023 in [facility] Clinical Support from 08/06/2022 in [facility] Clinical Support from 07/04/2022 in [facility] Clinical Support from 05/21/2022 in [facility] Office Visit from 03/02/2022 in [facility]  MADRS Total Score 21 29 15 27  46        Past Psychiatric Medication Trials: fluoxetine, duloxetine, Viibryd, Pristiq, sertraline, citalopram,  Trintellix anxious and SI Wellbutrin XL 450 Auvelity  1 dose nortriptyline 75 mg nightly for about 3 weeks and DT level 176, reduced to 50 mg HS NR. Pramipexole 1 mg NR Adderall, Adderall XR, Vyvanse, Ritalin, Strattera low  dose NR Lorazepam Trazodone  Depakote,  lamotrigine cog complaints Lithium remotely Abilify 7.5  Vraylar 1.5 mg daily agitation and insomnia Rexulti insomnia Latuda 40 one dose, CO anxious and SI Seroquel XR 300 Olanzapine 10  At visit November 12, 2019. We discussed Patient developed an increasingly severe alcohol dependence problem since her last visit in January.  She went to [facility] and has had no alcohol since then except 1 day.  She never abused stimulants but they took her off the stimulants at [facility].  Her ADD was markedly worse.  The Wellbutrin did not help the ADD.   D history lamotrigine rash at 67 yo  Review of Systems:  Review of Systems  Constitutional:  Negative for fatigue.  Musculoskeletal:  Positive for back pain. Negative for arthralgias and joint swelling.       SP hip surgery October 2020  Neurological:  Negative for dizziness.  Psychiatric/Behavioral:  Positive for decreased concentration. Negative for agitation, behavioral problems, confusion, dysphoric mood, hallucinations, self-injury, sleep disturbance and suicidal ideas. The patient is nervous/anxious. The patient is not hyperactive.        Forgetful at times about med recommendations.  Send present for him The only [place] she notices a it states it is unbelievable him how how much are her 3 steps around freedom of the present fall and just in the past decade  Medications: I have reviewed the patient's current medications.  Current Outpatient Medications  Medication Sig Dispense Refill   amLODipine (NORVASC) 2.5 MG tablet Take 2.5 mg by mouth daily.     amphetamine-dextroamphetamine (ADDERALL XR) 20 MG 24 hr capsule Take 1 capsule (20 mg total) by mouth every morning. 30 capsule 0   amphetamine-dextroamphetamine (ADDERALL XR) 20 MG 24 hr capsule Take 1 capsule (20 mg total) by mouth every morning. 30 capsule 0   buPROPion (WELLBUTRIN XL) 150 MG 24 hr tablet TAKE 3 TABLETS BY MOUTH DAILY  (Patient taking differently: Take 150 mg by mouth daily.) 270 tablet 1   cloNIDine (CATAPRES) 0.2 MG tablet TAKE 1 TABLET BY MOUTH 2 TIMES DAILY. 180 tablet 0   Dextromethorphan-buPROPion ER (AUVELITY) 45-105 MG TBCR Take 1 tablet by mouth 2 (two) times daily. 60 tablet 1   Esketamine HCl, 84 MG Dose, (SPRAVATO, 84 MG DOSE,) 28 MG/DEVICE SOPK USE 3 SPRAYS IN EACH NOSTRIL ONCE EVERY WEEK 3 each 1   iron polysaccharides (NIFEREX) 150 MG capsule TAKE 1 CAPSULE BY MOUTH EVERY DAY 90 capsule 1   LORazepam (ATIVAN) 1 MG tablet Take 1 tablet (1 mg total) by mouth every 8 (eight) hours as needed. for anxiety 90 tablet 1   losartan (COZAAR) 50 MG tablet Take 50 mg by mouth daily.     nebivolol (BYSTOLIC) 2.5 MG tablet Take 2.5 mg by mouth daily.     nortriptyline (PAMELOR) 25 MG capsule Take 2 capsules (50 mg total) by mouth at bedtime. 180 capsule 0   pramipexole (MIRAPEX) 0.5 MG tablet Take 1 tablet (0.5 mg total) by mouth 3 (three) times daily. 90 tablet 1   traZODone (DESYREL) 50 MG tablet TAKE 1-2 TABLETS BY MOUTH NIGHTLY AS NEEDED FOR SLEEP 180 tablet 1   No current facility-administered medications for this visit.    Medication Side Effects: None  Allergies:  Allergies  Allergen Reactions  Metronidazole Shortness Of Breath and Other (See Comments)    Heart pounding   Ferrlecit [Na Ferric Gluc Cplx In Sucrose] Other (See Comments)    Infusion reaction 05/12/2019    Past Medical History:  Diagnosis Date   ADHD    Anemia    Anxiety    Arthritis    Depression    Heart murmur    i went to see a cardiologit slast eyar  and i had zero plaque,    PONV (postoperative nausea and vomiting)    Recovering alcoholic in remission     Family History  Problem Relation Age of Onset   Atrial fibrillation Mother    CAD Father     Past Medical History, Surgical history, Social history, and Family history were reviewed and updated as appropriate.   Please see review of systems for further  details on the patient's review from today.   Objective:   Physical Exam:  There were no vitals taken for this visit.  Physical Exam Constitutional:      General: She is not in acute distress. Neurological:     Mental Status: She is alert and oriented to person, place, and time.     Coordination: Coordination normal.     Gait: Gait normal.  Psychiatric:        Attention and Perception: Attention and perception normal.        Mood and Affect: Mood is anxious. Mood is not depressed. Affect is not tearful.        Speech: Speech is not rapid and pressured.        Behavior: Behavior is not slowed.        Thought Content: Thought content is not paranoid or delusional. Thought content does not include homicidal or suicidal ideation. Thought content does not include suicidal plan.        Cognition and Memory: Cognition normal. Memory is not impaired. She does not exhibit impaired recent memory.        Judgment: Judgment normal.     Comments: Insight intact. No auditory or visual hallucinations. No delusions.  Depression resolved with pramipexole TID     Lab Review:     Component Value Date/Time   NA 137 01/12/2021 1430   NA 140 11/18/2018 1544   K 3.8 01/12/2021 1430   CL 108 01/12/2021 1430   CO2 22 01/12/2021 1430   GLUCOSE 94 01/12/2021 1430   BUN 14 01/12/2021 1430   BUN 20 11/18/2018 1544   CREATININE 0.82 01/12/2021 1430   CALCIUM 8.9 01/12/2021 1430   PROT 6.6 01/12/2021 1430   ALBUMIN 3.9 01/12/2021 1430   AST 12 (L) 01/12/2021 1430   ALT 11 01/12/2021 1430   ALKPHOS 46 01/12/2021 1430   BILITOT 0.5 01/12/2021 1430   GFRNONAA >60 01/12/2021 1430   GFRAA >60 09/02/2019 0249   GFRAA >60 01/27/2019 0811       Component Value Date/Time   WBC 4.5 01/12/2021 1430   RBC 4.32 01/12/2021 1430   HGB 12.8 01/12/2021 1430   HGB 12.9 07/17/2019 0953   HCT 38.5 01/12/2021 1430   HCT 21.9 (L) 12/25/2018 1221   PLT 272 01/12/2021 1430   PLT 286 07/17/2019 0953   MCV 89.1  01/12/2021 1430   MCH 29.6 01/12/2021 1430   MCHC 33.2 01/12/2021 1430   RDW 12.4 01/12/2021 1430   LYMPHSABS 1.4 01/12/2021 1430   MONOABS 0.4 01/12/2021 1430   EOSABS 0.0 01/12/2021 1430   BASOSABS 0.0 01/12/2021  1430    No results found for: "POCLITH", "LITHIUM"   No results found for: "PHENYTOIN", "PHENOBARB", "VALPROATE", "CBMZ"   .res Assessment: Plan:    Recurrent major depression resistant to treatment  Generalized anxiety disorder  Attention deficit hyperactivity disorder (ADHD), predominantly inattentive type  Insomnia due to mental condition  Accelerated hypertension   She has treatment resistant major depression ongoing with 50% better with Spravato. Have  discussed some of her  abnormal behaviors last year leading to this depressive episode getting worse which she says were associated with heavy use of delta 8 and not a manic episode.  She realizes now that that was not good for her.  She stopped all use of other drugs including those available over-the-counter such as delta 8 or any other THC related products.  She is no longer having any of those types of behaviors and instead is depressed.  She is experiencing more pleasure and is less blunted and enjoying more and better inteest versus before the Spravato.  She also feels worse if misses a dose of Spravato.  Depression resolved 3 weeks ago, after increasing pramipexole 0.5 mg TID  Patient was administered Spravato 84 mg intranasally today.  The patient experienced the typical dissociation which gradually resolved over the 2-hour period of observation.  There were no complications.  Specifically the patient did not have nausea or vomiting or headache.  Blood pressures monitored at the 40-minute and 2-hour follow-up intervals.  Borderline high.  By the time the 2-hour observation period was met the patient was alert and oriented and able to exit without assistance.  Patient feels the Spravato administration is helpful  for the treatment resistant depression and would like to continue the treatment.  See nursing note for further details.She wants to continue Spravato. We discussed discussed the side effects in detail as well as the protocol required to receive Spravato.   Failed multiple antidepressants.  Many of them were not actual failures but intolerances and it is unclear whether some of that was more connected with anxiety than true side effects.  1 example is the JordanLatuda.  In general she does not want to try anything but an antidepressant but has failed all major categories of antidepressants except TCAs and MAO inhibitors which have not been tried until now.  Started nortriptyline 75 mg nightly.  Serum level 176.  So Reduced to 50 mg HS  Discussed side effects in detail.  Needs more time to help. Extensive discussion previously about her ambivalence about meds and missatributing sx of depression as SE of meds.    She is tolerating Auvelity.  Continue 1  BID for at least 2 more weeks.  Worse with less pramipexole. Increased to 0.5 mg TID on about end of Feb 2024 and markedly better Dosing range off label for depression ranges from 1-5 mg daily.  Disc risk compulsive impulsive behavior and manis.    Started Spravato 84 mg twice weekly on 03/16/2022.  Now on weekly administration  Adderall  XR 20 mg AM   Ativan 1 mg 3 times daily as needed anxiety but try to cut it back. Is not ideal to use benzodiazepine with stimulant but because of the severity of her symptoms it has been necessary.  Hope to eventually eliminate the benzodiazepine.  Expected as her depression improves her anxiety will improve as well.  However lately her anxiety has been unmanageable.  We will expect that to improve as the depression improves.  She has headed insturctions to  reduce this.  Continue clonidine 0.2 mg BID off label for anxiety and helps BP partially. BP is better controlled but not consistent.  Consider increasing amlodipine.   Also on losartan 50  Discussed potential benefits, risks, and side effects of stimulants with patient to include increased heart rate, palpitations, insomnia, increased anxiety, increased irritability, or decreased appetite.  Instructed patient to contact office if experiencing any significant tolerability issues. She wants to return to usual dose of Adderall for ADD bc of mor poor cognitive function with reduction.  Also discussed that depression will impair cognitive function.  Disc risk polypharmacy.  Reevaluate meds after better with pramipexole.  Rec keep track of BP and discuss with PCP. It has been up and down.  Sometimes needs extra clonidine before Spravato  Has Maintained sobriety  FU with Spravato weekly   Meredith Staggers, MD, DFAPA     Please see After Visit Summary for patient specific instructions.  No future appointments.                 No orders of the defined types were placed in this encounter.      -------------------------------

## 2023-02-18 ENCOUNTER — Ambulatory Visit (INDEPENDENT_AMBULATORY_CARE_PROVIDER_SITE_OTHER): Payer: 59 | Admitting: Psychiatry

## 2023-02-18 ENCOUNTER — Encounter: Payer: Self-pay | Admitting: Psychiatry

## 2023-02-18 ENCOUNTER — Telehealth: Payer: Self-pay

## 2023-02-18 ENCOUNTER — Ambulatory Visit: Payer: 59

## 2023-02-18 VITALS — BP 97/61 | HR 60

## 2023-02-18 DIAGNOSIS — F5105 Insomnia due to other mental disorder: Secondary | ICD-10-CM | POA: Diagnosis not present

## 2023-02-18 DIAGNOSIS — F411 Generalized anxiety disorder: Secondary | ICD-10-CM | POA: Diagnosis not present

## 2023-02-18 DIAGNOSIS — F339 Major depressive disorder, recurrent, unspecified: Secondary | ICD-10-CM | POA: Diagnosis not present

## 2023-02-18 DIAGNOSIS — F900 Attention-deficit hyperactivity disorder, predominantly inattentive type: Secondary | ICD-10-CM

## 2023-02-18 DIAGNOSIS — I10 Essential (primary) hypertension: Secondary | ICD-10-CM

## 2023-02-18 MED ORDER — AUVELITY 45-105 MG PO TBCR
1.0000 | EXTENDED_RELEASE_TABLET | Freq: Two times a day (BID) | ORAL | 3 refills | Status: DC
Start: 2023-02-18 — End: 2023-04-01

## 2023-02-18 NOTE — Telephone Encounter (Signed)
Received a call from pt that she ran out of her Auvelity, either Friday or Saturday pt isn't sure. [facility] to [facility] if needing [facility] from [place] and they are waiting for the order to get in. [place] it should be this afternoon and for pt to check back then. Pt has Spravato treatment this afternoon and will check to see if we have samples available.  Stressed the importance to the pt to make sure she is not running out of her medication because she will feel bad, she does report worsening depression.   Will check with Dr. Jennelle Human on pt changing pharmacies since the availability may be better at [facility].

## 2023-02-18 NOTE — Progress Notes (Addendum)
[name] 05-Oct-1956 67 y.o.  Subjective:   Patient ID:  Laura Chang is a 67 y.o. (DOB 09-01-1956) female.  Chief Complaint:  Chief Complaint  Patient presents with   Follow-up   Depression   Anxiety   ADD      Laura Chang presents to the office today for follow-up of depression and anxiety and ADD.  seen November 12, 2019.  Melted down in 2020.  Went to [employer] in July.  No withdrawal.  1 drink since then.  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 She was on no stimulant and no SSRI but was taking Strattera and Wellbutrin.  The following changes were made. Stop Strattera. OK restart stimulant bc severe ADD Restart Adderall 1 daily for a few days and if tolerated then restart 1 twice daily. If not tolerated reduce the dosage if needed. May need to stop Wellbutrin if not tolerating the stimulant.  Yes.  DC Wellbutrin Restart Prozac 20 mg daily.  February 2021 appointment with the following noted: Completed grant proposal.  Couldn't doit without Adderall.  Sold a bunch of work.   Adderall XR lasts about 3 pm.  Strength seems about right.  BP been OK.  Not jittery.   Stopped Wellbutrin but had no SE. Mood drastically better with grant proposal and back on fluoxetine.  Less depressed and lethargic.  No anxiety.  Cut back on coffee. Started back with devotions and stronger faith. Plan: Continue Prozac 20 mg daily. May have to increase the dose at some point in the future given that she usually was taking higher dosages but she is getting good response at this time. Restart Wellbutrin off label for ADD since can't get 2 ADDERALL daily. 150 mg daily then 300 mg daily. She can adjust the dose between 150 mg and 300 mg daily to get the optimal effect.   05/11/2020 appointment with the following noted: Has been inconsistent with Prozac and Wellbutrin. Not sure of the effect of Wellbutrin. Biggest deterrent in work is anxiety.   Some of the work is conceptual and difficult at times.  Can feel she's not up to a project at times.  Overall is OK but would like a steadier benefit from stimulant.  Exhausted from managing concentration and keeping up with things from the day.  Loses things.  Not good keeping up with schedule. Overall productive and emotionally OK. Can feel Adderall wear off. Mood is better in summer and worse in the winter.   F died in 10-15-2023 and that is a loss. No SE Wellbutrin. Still attends AA meetings.  Real benefit from [facility] last year. Recognizes effect of anemia on ADD and mood.  Had iron infusions last winter. Plan:  Wellbutrin off label for ADD since can't get 2 ADDERALL daily. 150 mg daily then 300 mg daily.  01/24/2021 appointment with following noted: Doing a program called [employer] mindfulness app since Xmas.  CBT app helped the depression.  App helped her focus better.  Lost sign weight. Writing a lot. Before Xmas felt depressed and started negative thinking worse, self denigrating. Not drinking. More isolated.   Recognizes mo is narcissist.    Didn't tell anyone she was born until 3 mos later.  M aloof and uninterested in pt.  Lied about her birthday.  Mo lack of affection even with pt's kids. Going to [place] for a year and it helped her to quit drinking. Also misses kids being gone  with a hole also. Plan: No med changes  05/04/2021 appointment with the following noted: Therapist [name] thinks she's manic. Lost weight to 144#.   States she is still sleeping okay.  Admits she is hyper and recognizes that she is likely manic.  She feels great, euphoric with an increased sense of spiritual connectedness to God.  She has racing thoughts and talks fast and talks a lot and this is noted by her husband.  He thinks she is a bit hyper.  She has been able to maintain sobriety although she will have 1 glass of wine on special occasions but does not drink by herself.  She is not drinking to  excess.  She denies any dangerous impulsivity.  She is clearly not depressed and not particularly anxious.  She has no concerns about her medication and she has been compliant.  06/16/21 appt noted: So much better.  Going through a lot but the manic thing happened on top of it.  So much slower.  Didn't feel like losing anything with risperidone.  Likes the Adderalll at 10 mg. Some drowsiness in the AM and very drowsy from risperidone 2 mg HS. Prayer life is better. Handling stress better. Less depressed with risperidone. Still likes trazodone. Sleeps well. Plan: Reduce Prozac to 10 mg daily.  Consider stopping it because it can feel the mania however she is reluctant to do that because she fears relapse of depression. Reduce risperidone to 1.5 mg nightly due to side effects.  Discussed risk of worsening mania.  07/25/2021 appointment with the following noted: Misses the Adderall and hard to function without it. Depressed now. Heavy chest.  Anxious and guilty.  Body feels heavy.   Hates Wellbutrin.   Plan: Increase fluoxetine to 20 mg daily Add Abilify 1/2 of 15 mg tablet daily Wean wellbutrin by 1 tablet each week  bc she feels it is not helpful and DT polypharmacy Reduce risperidone to 1 daily for 1 week and stop it. Disc risk of mania. Increase Adderall to XR 20 mg AM  08/08/21 Much less depressed and starting to feel normal I feel a lot better. No SE.  Speech normal off risperidone. Sleeping OK on trazaodone and enough.   Noticed benefit from Adderall again. Plan: continue fluoxetine to 20 mg daily Continue Abilify 1/2 of 15 mg tablet daily for depression and mania continue Adderall to XR 20 mg AM  10/10/2021 phone call: Pt stated she feels like the Abilify should be decreased to 23m.She said she is depressed but rational and not suicidal.She has an appt Monday and can wait until then if you prefer. MD response: Reduce the Abilify to 7.5 mg every other day.  We will meet on 10/16/2021  and decide what to do from there.  10/16/2021 appointment with the following noted: More depressed.  Most depressed I've ever been.  Just numb.  Sense of grief.   Thinks the manic episode was unlike anything else she ever had.  Doesn't want to medicate against it.  Don't enjoy people.  Easily overwhelmed.  Had some death thoughts but not suicidal.  Has been functional.  Feels better today after reducing Abilify to every other day but she is only been doing that for 3 days. A/P: Episode of post manic depression was explained. continue fluoxetine to 20 mg daily Hold Abilify for 1 week then resume Abilify 1/2 of 15 mg tablet every other day for depression and mania continue Adderall to XR 20 mg AM  10/27/2021  appointment with the following noted: I'm doing so much better.  Handling the depression better. Better self talk and spiritual focus has helped.   Dep 6/10 manifesting as anxiety with low confidence.   F died 2  years ago and M 67 yo and is dependent . She is working hard to feel better but still feels depressed.  She almost feels like she has a little more anxiety since restarting Abilify every other day. Plan: continue fluoxetine to 20 mg daily DC Abilify .  Vrayalar 1.5 mg QOD to try to get rid of depression ASAP. continue Adderall to XR 20 mg AM  11/10/2021 appointment with the following noted: Busy with Xmas and it was fun with family but then a big let down.  Did well with it.  Functioned well with it.  Working hard on things with depression.  Not shutting down. Not sure but feels better today but yesterday was hard.  Difficulty dealing with mother.  She won't do anything to help herself.  Yesterday with her all day.  Won't do PT and has isolated herself.    Lack of confidence.   No SE with Vraylar.  11/24/21 urgent appointment appt noted: More and more depressed.   So anxious and doesn't want to be alone but can do so. No appetite. Hurts inside. Has had some fleeting suicidal  thoughts but would not act on them.  Tolerating meds. Has been consistent with Vraylar 1.5 mg every other day, fluoxetine 20 mg daily Plan: Increase Vraylar to 1.5 mg daily Change Prozac to Trintellix 10 mg daily. Discussed side effects of each continue Adderall to XR 20 mg AM  12/27/2021 appointment with the following noted: Not OK.  I feel less depressed but feels bat shit. Not sleeping well.  Extremely anxious. Off and on sleep. 3-4 hours of sleep.   Still having daily SI.  But also become obvious has so much to do.  Overwhelmed by tasks.   Needs anxiety meds to just function. Not more motivated.  Walked yesterday.   Feels afraid like in trouble but not irritable or angry. DC DT agitation Vraylar to 1.5 mg daily Change Prozac to Trintellix 10 mg daily. Hold Adderall to XR 20 mg AM Clonidine 0.1 1/2 tablet twice daily for 2 days and if needed for anxiety and sleep increase to 1 twice daily Ok temporary Ativan 1 mg 3 times daily as needed anxiety  01/05/22 appt noted: Off fluoxetine and  Trintellix.  Only on Ativan, trazodone and Adderall XR 20 plus added clonidine 0.1 mg BID Didn't think she needed to start Trintellix. Not taking Ativan.   Didn't like herself last week. Feels some better today. Wonders if the manic sx Not agitated.  Anxiety kind of calmed down.  A lot to be anxious about situationally.  $ stress. Concerns about downers with meds. Can't access normal personality. ? Lethargy and inability to talk as sE. Plan: Latuda 20-40 mg daily with food. Adderall to XR 20 mg AM Clonidine 0.1 1/2 tablet twice daily  reduce dose to be sure no SE Ok temporary Ativan 1 mg 3 times daily as needed anxiety  01/19/22 appt noted: Taking Latuda 20 mg daily.  Took 40 mg once and felt anxious and  SI Still depressed and not very reactive Anxiety mainly about the depression and fears of the future. She wants to revisit manic sx and thinks it was maybe bc taking delta 8 bc was taking a lot  of it so still  doesn't think she's classic bipolar. She wants to only take Prozac bc thinks Latuda is perpetuating depression. Says the delta 8 was very psychaedelic.  When not taking it was not manic.  Sleeping ok again.  Plan: Per her request DC Latuda 20-40 mg daily with food. She wants to continue Prozac alone AMA  Adderall to XR 20 mg AM Clonidine 0.1 1/2 tablet twice daily  reduce dose to be sure no SE Ok temporary Ativan 1 mg 3 times daily as needed anxiety  01/23/2022 phone call complaining of increased anxiety since stopping Latuda.  She will try increasing clonidine.  01/26/2022 phone call not feeling well and wanted to restart the Vraylar.  However notes indicate that had made her agitated therefore she was encouraged to pick up samples of Rexulti 1 mg and start that instead.  02/06/2022 phone call: Stating she felt the Rexulti was helping with depression but she was not sleeping well and obsessing over things.  She was encouraged to increase Rexulti to 2 mg daily and increase trazodone for sleep.  02/09/2022 appointment with the following noted: This was an urgent work in appointment No sleep last night with trazodone 100 mg HS Nothing really better depression or anxiety. Ruminating negative anxious thoughts. Did not tolerate Rexulti because it was causing insomnia.  Does not think it helped depression.  Lacks emotion that she should have.  Lacks her usual personality.  Some hopeless thoughts.  Some death thoughts.  Some suicidal thoughts without plan or intent Plan: DC Rexulti and Prozac & DC trazodone Adderall to XR 20 mg AM Clonidine 0.1 1/tablet twice daily  reduce dose to be sure no SE Ok temporary Ativan 1 mg 3 times daily as needed anxiety Start Seroquel XR 150 mg nightly  03/02/2022 appointment: Langley Gauss called back a few days after starting Seroquel stating it was making her more anxious and more depressed.  This seemed unlikely as this medicine rarely ever causes anxiety.   She stopped the medication waited 3 days and called back still had anxiety and depression but thought perhaps the anxiety was a little better.  She did not want to take the Seroquel. She knew about the option of Spravato and wanted to pursue that. Now questions whether to return to Seroquel while waiting to start Spravato bc feels just as bad without it and knows she didn't give it enough time to work.   MADRS 46  ECT-MADRS    Flowsheet Row Clinical Support from 01/08/2023 in [place] from 08/06/2022 in [place] from 07/04/2022 in [place] from 05/21/2022 in [place] Office Visit from 03/02/2022 in [facility]  MADRS Total Score '21 29 15 27 '$ 46      03/14/22 appt noted: Pt received Spravato 56 mg first dose today with some dissociative sx which were not severe.  She was anxious prior to the administration and felt better after receiving lorazepam 1 mg.  No NV, or HA. Wants to continue Spravato. Ongoing depression and desperate to feel better.  I'm not myself DT deprsssion which is most severe in recent history.  Anhedonia.  Low motivation.  Social avoidance. Continues to think all recent med trials are making her worse.  Sleep ok with Seroquel.  03/16/22 appt noted: Received Spravato 84 mg for the first time.  some dissociative sx which were not severe.  She was anxious prior to  the administration and felt better after receiving lorazepam 1 mg.  No NV, or HA. Wants to continue Spravato.   Does not feel any better or different since the last appt.  Ongoing depression.  Ongoing depression and desperate to feel better.  I'm not myself DT deprsssion which is most severe in recent history.  Anhedonia.  Low motivation.  Social avoidance. Continues to think all recent med trials are making her worse.  Sleep  ok with Seroquel.  Does not want to continue Seroquel for TRD.  03/20/2022 appointment noted: Came for Spravato administration today.  However blood pressure was significantly elevated approximately 180/115.  She was given lorazepam 1 mg and clonidine 0.2 mg to try to get it down. She states she regretted stopping the Seroquel XR 300 mg tablets.  She now realizes it was helpful.  She did not sleep much at all last night.  She did not take the Adderall this morning. 2 to 3 hours after arrival blood pressure was still elevated at  170/110, 62 pulse.  For Spravato administration was canceled for today.  She admits to being anxious and depressed.  She is not suicidal.  She is highly motivated to receive the Spravato.  We discussed getting it tomorrow.  03/22/2022 appointment noted: Patient's blood pressure was never stable enough yesterday in order to get her in for Spravato administration.  She was encouraged to see her primary care doctor.  It is better today.  03/26/2022 appointment with the following noted: Blood pressure was better.  Saw her primary care doctor who started on oral Bystolic 2.5 mg daily. Received Spravato 84 mg today as scheduled.  Tolerated it well without nausea or vomiting headache or chest pain or palpitations.  Her blood pressure was borderline but manageable. She remains depressed and anxious.  She is ambivalent about the medicine and desperate to get to feel better.  Continues to have anhedonia and low energy and low motivation and reduced ability to do things.  Less social.  Not suicidal.  03/28/22 appt noted: Received Spravato 84 mg today as scheduled.  Tolerated it well without nausea or vomiting headache or chest pain or palpitations.  Her blood pressure was borderline but manageable. Has not seen any improvement so far.  Tolerating Seroquel.  Inconsistent with Bystolic and BP has been borderline high. Still depressed and anxious and anhedonia.  Low motivation, energy,  productivity. Taking quetiapine and tolerating XR 300 mg nightly.  04/04/22 appt noted: Received Spravato 84 mg today as scheduled.  Tolerated it well without nausea or vomiting headache or chest pain or palpitations.  Her blood pressure was borderline but manageable. Has not seen any improvement so far.  Tolerating Seroquel.   She still tends to think that the medications are making her worse.  She has said this about each of the recent psychiatric medicines including Seroquel.  However her husband thinks she is improved.  She also admits there is some improvement in productivity.  She still feels highly anxious.  She still does not enjoy things as normal.  She still feels desperate to improve as soon as possible. Has been taking Seroquel XR since 03/20/2022  04/10/22 appt noted: Received Spravato 84 mg today as scheduled.  Tolerated it well without nausea or vomiting headache or chest pain or palpitations.  Her blood pressure was borderline but manageable. Has not seen any improvement so far.  Tolerating Seroquel.  Doesn't like Seroquel bc she thinks it flattens here. 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  Tolerated it well without nausea or vomiting headache or chest pain or palpitations.  Her blood pressure was borderline but manageable. Has not seen any improvement so far.  Tolerating Seroquel.  Doesn't like Seroquel bc she thinks it flattens her. Received Spravato 84 mg today as scheduled.  Tolerated it well without nausea or vomiting headache or chest pain or palpitations.  Her blood pressure was borderline but manageable. Has not seen any improvement so far.  Tolerating Seroquel.  Doesn't like Seroquel bc she thinks it flattens here.  We discussed her ambivalence about it. She is starting Auvelity and has tolerated it  the last 2 days without side effect.  She still does not feel like herself and feels flat and not enjoying things with suppressed expressed emotion  04/17/2022 appointment with the following noted: Received Spravato 84 mg today as scheduled.  Tolerated it well without nausea or vomiting headache or chest pain or palpitations.  Her blood pressure was borderline but manageable. Has not seen any improvement so far.  Tolerating Seroquel.  Doesn't like Seroquel bc she thinks it flattens her. She has been tolerating the Auvelity 1 in the morning without side effects for about a week.  She has not noticed significant improvement so far.  She still feels depressed and flat and not herself.  Other people notice that she is flat emotionally.  She is not suicidal.  She does feel discouraged that she is not getting better yet.  04/19/2022 appointment noted: Has increased Auvelity to 1 twice daily for 2 days, continues quetiapine XR 300 mg nightly, clonidine 0.3 mg twice daily, lorazepam 1 mg twice daily for anxiety and Adderall XR 20 mg in the morning. No obious SE but she still thinks quetiapine XR is making her feel down.  But not sedated Received Spravato 84 mg today as scheduled.  Tolerated it well without nausea or vomiting headache or chest pain or palpitations.  Her blood pressure was borderline but manageable. She still feels quite anxious and feels it necessary to take both the clonidine and lorazepam twice a day to manage her anxiety.  She has been consistently down and flat and not herself until yesterday afternoon she noted an improvement in mood and feeling much more like herself with her normal personality reemerging.  She was quite depressed in the morning with very dark negative thoughts.  She did not have those dark negative thoughts this morning.  She had a lot of questions about medication and when she was expecting to be improved and why she has not shown improvement up to now.  04/23/22 appt  noted: Has increased Auvelity to 1 twice daily for 1 week, continues quetiapine XR 300 mg nightly, clonidine 0.3 mg twice daily, lorazepam 1 mg twice daily for anxiety and Adderall XR 20 mg in the morning. No obious SE but she still thinks quetiapine XR is making her feel down.  But not sedated Received Spravato 84 mg today as scheduled.  Tolerated it well without nausea or vomiting headache or chest pain or palpitations.  She is still depressed but admits better function and is able to enjoy social interactions. Tolerating meds.  Would like to feel better for sure. Not herself.  Flat. Plan increase Auvelity to 1 tab BID as planned and reduce Quetiapine to 1/2 of ER 300 mg  bc NR for depression.  04/25/2022 appointment with the following noted: clonidine 0.3 mg twice daily, lorazepam 1 mg twice daily for anxiety and Adderall XR 20 mg in the morning. Seroquel XR 300 HS No obious SE but she still thinks quetiapine XR is making her feel down.  But not sedated Received Spravato 84 mg today as scheduled.  Tolerated it well without nausea or vomiting headache or chest pain or palpitations.  Called yesterday with more anxiety.  Had increased Auvelity for 1 day and reduced Seroquel XR for 1 day.  Felt restless and fearful  05/01/2022 appointment noted: clonidine 0.3 mg twice daily, lorazepam 1 mg twice daily for anxiety and Adderall XR 20 mg in the morning. Seroquel XR 150 HS, Auvelity 1 BID Received Spravato 84 mg today as scheduled.  Tolerated it well without nausea or vomiting headache or chest pain or palpitations.  Nurse has noted patient has called multiple times sometimes asking the same question repeatedly.  It is unclear whether she is truly forgetful or is just anxious seeking reassurance. Patient acknowledges ongoing depression as well as some anxiety but states she has felt a little better in the last couple of days.  She has reduced the Seroquel to 150 mg at night and has increased Auvelity to 1  twice daily but only for 1 day.  So far she seems to be tolerating it.  05/03/22 appt noted: clonidine 0.2 mg twice daily, lorazepam 1 mg twice daily for anxiety and Adderall XR 20 mg in the morning. Seroquel XR 150 HS, Auvelity 1 BID BP high this am about 170/100 and received extra clonidine 0.2 mg and came to receive Spravato.  Not dizzy, no SOB, nor CP but BP is still high Could not receive Spravato today bc BP high and pulse low at 30 ppm. Still depressed and anxious. Plan: continue trial Auvelity with Spravato She needs to get BP and pulse managed  05/08/22 TC: RTC  H Michael NA and mailbox full.  Could not leave message.  Pt  -  talked to she and H on speaker. H worried over wife.  Vacant stare.  Slurs words at times.  Not smiling. Reduced enjoyment.  Depression.  Withdrawn from usual activities.  Some irritability.  Anxious. Disc her concerns meds are making her worse.  Extensive discussion about her treatment resistant status.  There is a consistent pattern of not taking the medicines long enough to get benefit because she believes the meds are making her worse.  However the symptoms she describes as side effects are exactly the same symptoms that she had prior to taking the medication RX for  the depression.  So it is not clear that these are actual side effects. This is true about the 2 most recent meds including Seroquel and Auvelity.  Recommend psychiatric consultation in hopes of improving her comfort level with taking prescribed medications for a sufficient length of time to provide benefit. Extensive discussion about ECT is the treatment of choice for treatment resistant depression.  Spravato may work if she can comply with consistency.  There are medication options but they take longer to work.   Plan:  Reduce clonidine to 0.1 mg BID DT bradycardia.  Talk with PCP about BP and low pulse problems which are interfering with her consistent compliance with Spravato.   Limit lorazepam to  3 -4  mg daily max. Excess use is the cause of slurring speech.  She must stop excess use or will have to stop the med. Stop Auvelity per  her request.  But she has only been on the full dose for a little over a week and clearly has not had time to get benefit from it.  She thinks maybe it is making her more anxious. Reduce Seroquel from 150XR to 50 -100 mg at night IR.  She couldn't sleep when stopped it completely. Will not start new antidepressant until her SE issues are resolved or not. Get second psych opinion from Yehuda Budd MD or another psychiatrist.  H's sister is therapist in [place], MD, [facility]  05/16/2022 appointment with the following noted: Received Spravato 84 mg today as scheduled.  Tolerated it well without nausea or vomiting headache or chest pain or palpitations.  She stopped Auvelity as discussed last week. On her own, without physician input, she restarted Wellbutrin XL 450 mg every morning today.  She had taken it in the past.  She feels jittery and anxious. She feels less depressed than she did last week.  But she is still depressed without her usual range of affect.  She still is less social and less motivated than normal. Her primary care doctor increased the dose of losartan Plan: Stop Seroquel Reduce Wellbutrin XL to 300 mg every morning.  Starting the dose at 450 every morning is likely causing side effects of jitteriness and it should not be started at that have a dosage. Recommend she not change meds on her own without MDM put  05/23/2022 appointment with the following noted: Received Spravato 84 mg today as scheduled.  Tolerated it well without nausea or vomiting headache or chest pain or palpitations.  Has not dropped seroquel XR 300 mg 1/2 tablet nightly bc couldn't sleep without it. Has not tried lower dose quetiapine 50 mg HS Still feels depressed.   BP is better managed so far, just saw PCP.  BP is better today and infact is low today. Dropped  clonidine as directed from 0.3 mg BID bc inadequate control of BP to 0.2 mg BID.  However she wants to increase it back to 0.3 mg twice daily because she feels it helped her anxiety better.  Wonders about increasing Wellbutrin for depression.  However she has only been on 300 mg a day for a week.  She was on 450 mg daily in the past.  06/06/22 appt noted: Received Spravato 84 mg today as scheduled.  Tolerated it well without nausea or vomiting headache or chest pain or palpitations.  She is still depressed and anxious.  She wants to try to stop the Seroquel but cannot sleep without some of it.  She is taking lorazepam 1 mg 4 times daily and still having a lot of anxiety.  She wants to increase clonidine back to 0.3 mg twice daily.  She hopes for more improvement She recently went for a second psychiatric opinion as suggested the results of that are pending.  06/11/22 appt noted: Received Spravato 84 mg today as scheduled.  Tolerated it well without nausea or vomiting headache or chest pain or palpitations.  She is still depressed and anxious. Without much change.  Still hopeless, anhedonia, reduced inteterest and motivation.  Tolerating meds. Disc concerns Spravato is not hleping much. Plan: stop Seroquel and start olanzapine 10 mg HS for TRD and anxiety.  06/13/2022 appointment noted: Received Spravato 84 mg today as scheduled.  Tolerated it well without nausea or vomiting headache or chest pain or palpitations.  She is still depressed and anxious. Without much change.  Still hopeless, anhedonia, reduced inteterest  and motivation.  Tolerating meds. Disc concerns Spravato is not helping much as hoped but is improving a bit in the last week. Tolerating meds. Continues Wellbutrin XL 450 AM, tolerating recently started olanzapine  10 mg HS. Sleep is good.   Pending appt with [place] consult.  06/18/22 appt noted: Received Spravato 84 mg today as scheduled.  Tolerated it well without nausea or vomiting  headache or chest pain or palpitations.  Tolerating meds. Continues Wellbutrin XL 450 AM, tolerating recently started olanzapine  10 mg HS. Continues Adderall XR 20 amd and has tried to reduce lorazepam to '1mg'$  TID Sleep is good.   Pending appt with [place] consult. Depression is a little bit better in the last week with a little improvement in emotional expression and interest.  She is pushing herself to be more active.  Her daughter thought she was a little better than she has been.  However she is still depressed and still not her normal self with anhedonia and reduced emotional expressiveness.  06/20/22 appt noted: Received Spravato 84 mg today as scheduled.  Tolerated it well without nausea or vomiting headache or chest pain or palpitations.  Tolerating meds with a little sleepiness. Continues Wellbutrin XL 450 AM, tolerating recently started olanzapine  10 mg HS. Continues Adderall XR 20 amd and has tried to reduce lorazepam to '1mg'$  TID Sleep is good.   Mood is improving.  Better funciton.  Anxiety is better with olanzapine. Still not herself and depression not gone with some anhedonia and social avoidance and feeling overwhelmed.  8/14 2023 received Spravato 84 mg 06/27/2022 received Spravato Spravato 84 mg 07/02/2022 received Spravato 84 mg 07/04/2022 received Spravato 84 mg  07/09/2022 appointment noted: Received Spravato 84 mg today as scheduled.  Tolerated it well without nausea or vomiting headache or chest pain or palpitations.  Expected dissociation and feels less depressed with resolution of negative emotions immediately after Spravato and then depression, anxiety creep back in. Continues meds Adderall XR 20 mg every morning, Wellbutrin XL 450 every morning, clonidine 0.1 mg twice daily, lorazepam 1 mg every 6 hours as needed, olanzapine increased from 7.5 to 10 mg nightly on  Tolerating meds.  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  She does note some increased appetite and is somewhat concerned about that but has not gained significant amounts of weight. She has had the [place] consultation which was initially denied but she knows it can be appealed.  However because she is improving with Spravato plus the other medications now she wants to continue the current treatment plan.  07/18/22 appt noted: Continues meds Adderall XR 20 mg every morning, Wellbutrin XL 450 every morning, clonidine 0.1 mg twice daily, lorazepam 1 mg every 6 hours as needed, olanzapine increased from 7.5 to 10 mg nightly on 07/04/2022. Received Spravato 84 mg today as scheduled.  Tolerated it well without nausea or vomiting headache or chest pain or palpitations.  Expected dissociation and feels less depressed with resolution of negative emotions immediately after Spravato and then depression, anxiety creep back in. Continues meds Adderall XR 20 mg every morning, Wellbutrin XL 450 every morning, clonidine 0.1 mg twice daily, lorazepam 1 mg every 6 hours as needed, olanzapine increased from 7.5 to 10 mg nightly on  Tolerating meds.  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  She does note some increased appetite and is somewhat concerned about that but has not gained significant amounts of weight. She has had the [place] consultation which was initially denied but she knows it can be appealed. She continues to have chronic ambivalence about psychiatric medicines and initially tends to blame her depressive symptoms such as decreased concentration and feeling flat on what ever medicine she currently is taking even though she had the same symptoms before the current medicines were started.  Then after discussion she does admit that her depressive symptoms are improved since adding  olanzapine but still has those residual symptoms noted.  07/23/22 received Spravato 84 mg   07/30/2022 appointment noted: Received Spravato 84 mg today as scheduled.  Tolerated it well without nausea or vomiting headache or chest pain or palpitations.  Expected dissociation and feels less depressed with resolution of negative emotions immediately after Spravato and then depression, anxiety creep back in. Continues meds Adderall XR 20 mg every morning, Wellbutrin XL 450 every morning, clonidine 0.1 mg twice daily, lorazepam 1 mg every 6 hours as needed, olanzapine increased from 7.5 to 10 mg nightly on  She has been inconsistent with olanzapine because she continues to be ambivalent about the medications in general and thinks that perhaps the 10 mg is making her feel blunted.  She continues to feel some depression.  She had a good day this week and but still feels somewhat depressed and persistently anxious. Plan: be consistent with olanzapine 10 mg HS for TRD and longer trial for potential benefit for anxiety.  Has not taken it consistently.  08/06/2022 appointment noted: Received Spravato 84 mg today as scheduled.  Tolerated it well without nausea or vomiting headache or chest pain or palpitations.  Expected dissociation and feels less depressed with resolution of negative emotions immediately after Spravato and then depression, anxiety creep back in. Continues meds Adderall XR 20 mg every morning, Wellbutrin XL 450 every morning, clonidine 0.1 mg twice daily, lorazepam 1 mg every 6 hours as needed, olanzapine i 10 mg nightly  She continues to feel depressed but is about 50% better with Spravato.  She is still not herself.  She still has anhedonia.  She still is not her able to engage socially in the typical ways.  She is not jovial and outgoing like normal.  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  She continues to feel that her personality is dampened down.  There is a question about whether it is related to depression or medication. Plan: continue olanzapine 10 for longer trial for TRD and severe anxiety.  08/13/22 appt noted:  Received Spravato 84 mg today as scheduled.  Tolerated it well without nausea or vomiting headache or chest pain or palpitations.  Expected dissociation and feels less depressed with resolution of negative emotions immediately after Spravato and then depression, anxiety creep back in. Continues meds Adderall XR 20 mg every morning, Wellbutrin XL 450 every morning, clonidine 0.1 mg twice daily, lorazepam 1 mg every 6 hours as needed, olanzapine i 10 mg nightly  She still does not feel herself.  Still struggles with depression and low motivation and reduced social engagement and reduced interest and reduced emotional expression.  She is somewhat better with the medicines plus Spravato.  She still believes the Spravato makes her blunted and  is not sure how much it helps her anxiety.  She can have good days when her family is around and she is engaged.  She still wants to stop the olanzapine. She has apparently continued to take the trazodone despite having been told to stop it when she started olanzapine.  She feels like she needs the trazodone. Plan: DC olanzapine and Start nortriptyline 25 mg nightly and build up to 75 mg nightly and then check blood level.    08/27/2022 appointment noted: Received Spravato 84 mg today as scheduled.  Tolerated it well without nausea or vomiting headache or chest pain or palpitations.  Expected dissociation and feels less depressed with resolution of negative emotions immediately after Spravato and then depression, anxiety creep back in. Continues meds Adderall XR 20 mg every morning, Wellbutrin XL 450 every morning, clonidine 0.1 mg twice daily, lorazepam 1 mg every 6 hours as needed. Stopped olanzapine and started  nortriptyline which she has taken for about a week is 75 mg nightly. So far she is tolerating the nortriptyline well with the exception of some dry mouth and constipation which she is working to manage.  She does not feel substantially better better or different off the olanzapine.  No change in her sleep which is good.  Main concern currently in addition to the residual depression is anxiety which is somewhat situational with pending arch show.  She is worrying about it more than normal.  Says she is having to take lorazepam twice a day where she had been able to keep reduce it prior to this.  She still does not feel like herself with residual depression with less social interest and less of her usual buoyancy in personality.  She is flatter than normal.  Overall she still feels that the Spravato has been helpful at reducing the severity of the depression.  She is not suicidal. She has not heard anything about the [place] appeal as of yet.  09/05/2022 appointment noted: Received Spravato 84 mg today as scheduled.  Tolerated it well without nausea or vomiting headache or chest pain or palpitations.  Expected dissociation and feels less depressed with resolution of negative emotions immediately after Spravato and then depression, anxiety creep back in. Continues meds Adderall XR 20 mg every morning, Wellbutrin XL 450 every morning, clonidine 0.1 mg twice daily, lorazepam 1 mg every 6 hours as needed. Stopped olanzapine and started nortriptyline which she has taken for about 2 week is 75 mg nightly. Initially blood pressure was a little high causing delay in starting Spravato.  She admitted to feeling a little wound up.  She still experiences a little increase in depression if she goes longer than a week in between doses of Spravato.  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  09/10/22 appt noted: Received  Spravato 84 mg today as scheduled.  Tolerated it well without nausea or vomiting headache or chest pain or palpitations.  Expected dissociation gradually resolved over the 2 hour observation period. She feels 50% less depressed with Spravto and wants to continue it.   Continues meds Adderall XR 20 mg every morning, Wellbutrin XL 450 every morning, clonidine 0.1 mg twice daily, lorazepam 1 mg every 6 hours as needed. Has started nortriptyline 75 mg nightly for about 3 weeks. Has not seen a significant difference with the addition of nortriptyline.  Tolerating it pretty well. She continues  to have some degree of anhedonia and significant depression and anxiety.  Her daughters noticed that she is more needy and calls more frequently.  She acknowledges this as well.  She is clearly still not herself. Plan: pramipexole off label and RX 0.25 mg BID  09/17/2022 appointment noted: Received Spravato 84 mg today as scheduled.  Tolerated it well without nausea or vomiting headache or chest pain or palpitations.  Expected dissociation gradually resolved over the 2 hour observation period. She feels 50% less depressed with Spravto and wants to continue it.   Continues meds Adderall XR 20 mg every morning, Wellbutrin XL 450 every morning, clonidine 0.1 mg twice daily, lorazepam 1 mg every 6 hours as needed. Has started nortriptyline 75 mg nightly for about 3 weeks and DT level 176, reduced to 50 mg HS early November. Still the same sx as noted last visit.  Tolerating meds.   Compliant.  Still depressed and family notices.  Has been able to participate in family interactions.  Some post-show let down and has to do detailed work which is hard for her bc ADD.  Sleep and eating well.  Energy OK but not great.  No SI.  Not cried in a year or so.  Clearly less depressed and hopeless than before the Spravato.  09/24/22 appt noted: Received Spravato 84 mg today as scheduled.  Tolerated it well without nausea or vomiting  headache or chest pain or palpitations.  Expected dissociation gradually resolved over the 2 hour observation period. She feels 50% less depressed with Spravto and wants to continue it.   Continues meds Adderall XR 20 mg every morning, Wellbutrin XL 450 every morning, clonidine 0.1 mg twice daily, lorazepam 1 mg every 6 hours as needed. Has started nortriptyline 75 mg nightly for about 3 weeks and DT level 176, reduced to 50 mg HS early November. Still the same sx as noted last visit.  Tolerating meds.   Compliant.  Still depressed and family notices.  Has been able to participate in family interactions.  Some post-show let down and has to do detailed work which is hard for her bc ADD.  Sleep and eating well.  Energy OK but not great.  No SI.  Not cried in a year or so.  Clearly less depressed and hopeless than before the Spravato. Is not making further progress generally.  Stuck with moderate depression  10/02/22 appt noted: Received Spravato 84 mg today as scheduled.  Tolerated it well without nausea or vomiting headache or chest pain or palpitations.  Expected dissociation gradually resolved over the 2 hour observation period. She feels 50% less depressed with Spravto and wants to continue it.   Continues meds Adderall XR 20 mg every morning, Wellbutrin XL 450 every morning, clonidine 0.1 mg twice daily, lorazepam 1 mg every 6 hours as needed. Has started nortriptyline 75 mg nightly for about 3 weeks and DT level 176, reduced to 50 mg HS early November. Still the same sx as noted last visit.  Tolerating meds.   Compliant.  Still depressed and family notices.  Has been able to participate in family interactions.  Some post-show let down and has to do detailed work which is hard for her bc ADD.  Sleep and eating well.  Energy OK but not great.  No SI.  Not cried in a year or so.  Clearly less depressed and hopeless than before the Spravato. Is not making further progress generally.  Stuck with moderate  depression.  Is  able to function pretty normally. Plan: trial pramipexole 0.25 mg BID off label for depression.   10/08/22 appt noted: Received Spravato 84 mg today as scheduled.  Tolerated it well without nausea or vomiting headache or chest pain or palpitations.  Expected dissociation gradually resolved over the 2 hour observation period. She feels 50% less depressed with Spravto and wants to continue it.   Continues meds Adderall XR 20 mg every morning, Wellbutrin XL 450 every morning, clonidine 0.1 mg twice daily, lorazepam 1 mg every 6 hours as needed. Has started nortriptyline 75 mg nightly for about 3 weeks and DT level 176, reduced to 50 mg HS early November. Still the same sx as noted last visit.  Tolerating meds.   Compliant.  Still depressed and family notices.  Has been able to participate in family interactions.  Some post-show let down and has to do detailed work which is hard for her bc ADD.  Sleep and eating well.  Energy OK but not great.  No SI.  Not cried in a year or so.  Clearly less depressed and hopeless than before the Spravato. Is not making further progress generally.  Stuck with moderate depression.  Behaved and felt pretty normally with family over for Thanksgiving. Doesn't see benefit or SE with pramipexole but thinks maybe it makes her worse. Plan:  increase pramipexole augmentation off label to 0.5 mg BID  10/15/2022 appointment noted: Received Spravato 84 mg today as scheduled.  Tolerated it well without nausea or vomiting headache or chest pain or palpitations.  Expected dissociation gradually resolved over the 2 hour observation period. She feels 50% less depressed with Spravto and wants to continue it.   Continues meds Adderall XR 20 mg every morning, Wellbutrin XL 450 every morning, clonidine 0.2 mg twice daily, lorazepam 1 mg every 6 hours as needed.  Trazodone 50 mg tablets 1-2 nightly as needed insomnia Has started nortriptyline 75 mg nightly for about 3 weeks  and DT level 176, reduced to 50 mg HS early November. Recommended increase pramipexole 0.5 mg twice daily off label for treatment resistant depression on 10/08/2022. She feels better motivated more active with pramipexole 0.5 mg twice daily.  She still is depressed but it is better.  We discussed the possibility of going up in the dose but did not change it.  10/22/2022 appointment noted: Received Spravato 84 mg today as scheduled.  Tolerated it well without nausea or vomiting headache or chest pain or palpitations.  Expected dissociation gradually resolved over the 2 hour observation period. She feels 50% less depressed with Spravto and wants to continue it.   Continues meds Adderall XR 20 mg every morning, Wellbutrin XL 450 every morning, clonidine 0.2 mg twice daily, lorazepam 1 mg every 8 hours as needed.  Trazodone 50 mg tablets 1-2 nightly as needed insomnia Has started nortriptyline 75 mg nightly for about 3 weeks and DT level 176, reduced to 50 mg HS early November. pramipexole 0.5 mg twice daily off label for treatment resistant depression on 10/08/2022. She is better motivated than she was.  She is journaling 3 pages a day.  She has started walking and has walked 5 days a week for 50 minutes for the last 2 weeks.  That is significantly helped her mood.  Her mood tends to be better when she interacts with family.  However she still has some periods of depression.  She is tolerating the medications well.  She still feels like her affect and confidence is not  back to normal. Plan:  increase pramipexole augmentation off label to 0.5 mg tID or 0.75 mg BID   10/29/22 appt noted: Received Spravato 84 mg today as scheduled.  Tolerated it well without nausea or vomiting headache or chest pain or palpitations.  Expected dissociation gradually resolved over the 2 hour observation period. She feels 50% less depressed with Spravto and wants to continue it.   Continues meds Adderall XR 20 mg every  morning, Wellbutrin XL 450 every morning, clonidine 0.2 mg twice daily, lorazepam 1 mg every 8 hours as needed.  Trazodone 50 mg tablets 1-2 nightly as needed insomnia Has started nortriptyline 75 mg nightly for about 3 weeks and DT level 176, reduced to 50 mg HS early November. pramipexole increased to 0.75 mg twice daily off label for treatment resistant depression  She has been feeling somewhat better with the increase in pramipexole and is tolerating it well.  She still is easily overwhelmed.  Her affect and mood can improve now when around her family or doing something positive.  She has been able to be more productive. She is tolerating the current medications. She is still considering TMS as an alternative to Spravato.  11/15/2022 appointment noted: Received Spravato 84 mg today as scheduled.  Tolerated it well without nausea or vomiting headache or chest pain or palpitations.  Expected dissociation gradually resolved over the 2 hour observation period. She feels 50% less depressed with Spravto and wants to continue it.   Continues meds Adderall XR 20 mg every morning, Wellbutrin XL 450 every morning, clonidine 0.2 mg twice daily, lorazepam 1 mg every 8 hours as needed.  Trazodone 50 mg tablets 1-2 nightly as needed insomnia Has started nortriptyline 75 mg nightly for about 3 weeks and DT level 176, reduced to 50 mg HS early November. pramipexole increased to 0.75 mg twice daily off label for treatment resistant depression  He has noticed some increase in depression due to the length of time since the last Spravato administration.  She believes Spravato is helping her.  sHe is not suicidal but has felt very blue the last few days. She is tolerating the medication. She is ambivalent about Spravato versus [place] but she is considering [place]. She wants to continue Spravato administration because it is clearly helpful.  11/19/22 appt noted: Received Spravato 84 mg today as scheduled.  Tolerated it well  without nausea or vomiting headache or chest pain or palpitations.  Expected dissociation gradually resolved over the 2 hour observation period. She feels 50% less depressed with Spravto and wants to continue it.   Continues meds Adderall XR 20 mg every morning, Wellbutrin XL 450 every morning, clonidine 0.2 mg twice daily, lorazepam 1 mg every 8 hours as needed.  Trazodone 50 mg tablets 1-2 nightly as needed insomnia Has started nortriptyline 75 mg nightly for about 3 weeks and DT level 176, reduced to 50 mg HS early November. pramipexole increased to 0.75 mg twice daily off label for treatment resistant depression  She has felt better back on Spravato more regularly.  However she still has residual depression esp when alone or when wihtout activity.  Can function when needed.   Does not have her connfidence back. She plans to pursue [place] availability. Have discussed retrying [place]  11/28/22 appt noted: Received Spravato 84 mg today as scheduled.  Tolerated it well without nausea or vomiting headache or chest pain or palpitations.  Expected dissociation gradually resolved over the 2 hour observation period. She feels 50% less  depressed with Spravto and wants to continue it.   Continues meds Adderall XR 20 mg every morning, Wellbutrin XL 450 every morning, clonidine 0.2 mg twice daily, lorazepam 1 mg every 8 hours as needed.  Trazodone 50 mg tablets 1-2 nightly as needed insomnia Has started nortriptyline 75 mg nightly for about 3 weeks and DT level 176, reduced to 50 mg HS early November. pramipexole increased to 0.75 mg twice daily off label for treatment resistant depression  Last week she was more depressed than usual for reasons that are not clear.  She has not had a clear answer from [facility] [place] about [facility] options.  States that they have not returned her call.  She is asked about Auvelity retry in place of Wellbutrin.  Has still been walking.  12/03/22 appt noted: Received Spravato 84 mg today  as scheduled.  Tolerated it well without nausea or vomiting headache or chest pain or palpitations.  Expected dissociation gradually resolved over the 2 hour observation period. She feels 50% less depressed with Spravto and wants to continue it.   Continues meds Adderall XR 20 mg every morning, Wellbutrin XL 450 every morning, clonidine 0.2 mg twice daily, lorazepam 1 mg every 8 hours as needed.  Trazodone 50 mg tablets 1-2 nightly as needed insomnia started nortriptyline 75 mg nightly for about 3 weeks and DT level 176, reduced to 50 mg HS early November. pramipexole increased to 0.75 mg twice daily off label for treatment resistant depression  No SE .  Satisfied with meds. Depression is better in the last week.  Not sure why that is the case.  Still walking daily and that helps and journaling 3 pages daily.  Working on [employer], academic.  No changes desire.  Clearly benefits from Spravato but not 100%.  Still considering TMS.  12/10/22 appt noted: Received Spravato 84 mg today as scheduled.  Tolerated it well without nausea or vomiting headache or chest pain or palpitations.  Expected dissociation gradually resolved over the 2 hour observation period. She feels 50% less depressed with Spravto and wants to continue it.   Continues meds Adderall XR 20 mg every morning, Wellbutrin XL 450 every morning, clonidine 0.2 mg twice daily, lorazepam 1 mg every 8 hours as needed.  Trazodone 50 mg tablets 1-2 nightly as needed insomnia started nortriptyline 75 mg nightly for about 3 weeks and DT level 176, reduced to 50 mg HS early November. pramipexole increased to 0.75 mg twice daily off label for treatment resistant depression  No SE .   Depression was worse this week for no apparent reason.  She struggled being positive.  She has felt more discouraged.  She has felt more anxious.  She has had a hard time doing tasks.  She is interested in retrying the [place] which we had discussed previously. Plan: She agrees to  [facility].  To improve tolerability and reduce risk of side effects, Stop Wellbutrin and start Auvelity 1 in the morning for 1 week then 1 twice daily AndReduce pramipexole 0.5 mg BID and reduce nortriptyline to 50 mg HS  12/17/22 appt noted: Received Spravato 84 mg today as scheduled.  Tolerated it well without nausea or vomiting headache or chest pain or palpitations.  Expected dissociation gradually resolved over the 2 hour observation period. She feels 50% less depressed with Spravto and wants to continue it.   Continues meds Adderall XR 20 mg every morning, stopped Wellbutrin XL 150 every morning, clonidine 0.2 mg twice daily, lorazepam 1 mg every  8 hours as needed.  Trazodone 50 mg tablets 1-2 nightly as needed insomnia Has started nortriptyline 75 mg nightly for about 3 weeks and DT level 176, reduced to 50 mg HS early November. pramipexole 0.375 mg twice daily off label for treatment resistant depression with plan to stop Started Auvelity 1 AM Still depressed to moderate degree.  Tolerating Auvelity so far.  No other problems with meds.  Willing to give Auvelity a chance. Plan: She agrees to [facility].  Continue 1 AM and when tolerated then increase to BID Stop pramipexole.  12/26/22 received Spravato  01/01/23 appt noted: Received Spravato 84 mg today as scheduled.  Tolerated it well without nausea or vomiting headache or chest pain or palpitations.  Expected dissociation gradually resolved over the 2 hour observation period. She feels 50% less depressed with Spravto and wants to continue it.   Continues meds Adderall XR 20 mg every morning, stopped Wellbutrin XL 150 every morning, clonidine 0.2 mg twice daily, lorazepam 1 mg every 8 hours as needed.  Trazodone 50 mg tablets 1-2 nightly as needed insomnia Has started nortriptyline 75 mg nightly for about 3 weeks and DT level 176, reduced to 50 mg HS early November. Forgot to reduce pramipexole stoll taking  0.5 mg twice daily off  label for treatment resistant depression  Started Auvelity 1 AM & PM last week. Occ misses Spravato DT htn.this week a better than last.   Painting again more and it helps mood.  Confidence still low and not as likely to socialize as normal but enjoys family.   Still ambivalent about [place]. Tolerating meds.  Including the increase in [place].    01/08/23 appt noted: Received Spravato 84 mg today as scheduled.  Tolerated it well without nausea or vomiting headache or chest pain or palpitations.  Expected dissociation gradually resolved over the 2 hour observation period. She feels 50% less depressed with Spravto and wants to continue it.   Continues meds Adderall XR 20 mg every morning, stopped Wellbutrin XL 150 every morning, clonidine 0.2 mg twice daily, lorazepam 1 mg every 8 hours as needed.  Trazodone 50 mg tablets 1-2 nightly as needed insomnia Has started nortriptyline 75 mg nightly for about 3 weeks and DT level 176, reduced to 50 mg HS early November. reduced pramipexole to 0.5 mg daily off label for treatment resistant depression  Started Auvelity 1 AM & PM mid Feb. More depressed as week progresses.  Thinks it is worse with less pramipexole.  More negative.  Able to function but feels miserable.  No SI.  Hopeless.  Sleep ok.  Tolerating meds.   Talked to [facility] about [place] and appt mid March. Plan: increase pramipexole to 0.5 mg TID for dep bc seemed to worsen with the reduction.  01/15/23 appt noted: Received Spravato 84 mg today as scheduled.  Tolerated it well without nausea or vomiting headache or chest pain or palpitations.  Expected dissociation gradually resolved over the 2 hour observation period. She feels 50% less depressed with Spravto and wants to continue it.   Continues meds Adderall XR 20 mg every morning, stopped Wellbutrin XL 150 every morning, clonidine 0.2 mg twice daily, lorazepam 1 mg every 8 hours as needed.  Trazodone 50 mg tablets 1-2 nightly as needed  insomnia Has started nortriptyline 75 mg nightly for about 3 weeks and DT level 176, reduced to 50 mg HS early November. Increased pramipexole to 0.5 mg  to TID  off label for treatment resistant depression  Started Auvelity 1 AM & PM mid Feb. markedly better depression the increase in pramipexole.  She feels like her depression is almost resolved.  She is very pleased with the response.  She is tolerating the medications well  01/30/23 appt noted: Received Spravato 84 mg today as scheduled.  Tolerated it well without nausea or vomiting headache or chest pain or palpitations.  Expected dissociation gradually resolved over the 2 hour observation period. She feels 50% less depressed with Spravto and wants to continue it.   Continues meds Adderall XR 20 mg every morning, stopped Wellbutrin XL 150 every morning, clonidine 0.2 mg twice daily, lorazepam 1 mg every 8 hours as needed.  Trazodone 50 mg tablets 1-2 nightly as needed insomnia Has started nortriptyline 75 mg nightly for about 3 weeks and DT level 176, reduced to 50 mg HS early November. Increased pramipexole to 0.5 mg  to TID  off label for treatment resistant depression  Started Auvelity 1 AM & PM mid Feb. Mood markedly better with pramipexole added.  Nearly normal mood now with minimal depression.  More social and outgoing and motivated and resolved anhedonia. No SE.  Compliant.  02/04/23 appt noted: Continues meds Adderall XR 20 mg every morning, stopped Wellbutrin XL 150 every morning, clonidine 0.2 mg twice daily, lorazepam 1 mg every 8 hours as needed.  Trazodone 50 mg tablets 1-2 nightly as needed insomnia Has started nortriptyline 75 mg nightly for about 3 weeks and DT level 176, reduced to 50 mg HS early November. Increased pramipexole to 0.5 mg  to TID  off label for treatment resistant depression  Started Auvelity 1 AM & PM mid Feb. Received Spravato 84 mg today as scheduled.  Tolerated it well without nausea or vomiting headache  or chest pain or palpitations.  Expected dissociation gradually resolved over the 2 hour observation period. She feels 50% less depressed with Spravto and wants to continue it.  The pramipexole got her the rest of the way better.  Doesn't want to cut back on any tx bc afraid of relapse. Tolerating meds.  02/15/23 appt noted: Continues meds Adderall XR 20 mg every morning, stopped Wellbutrin XL 150 every morning, clonidine 0.2 mg twice daily, lorazepam 1 mg every 8 hours as needed.  Trazodone 50 mg tablets 1-2 nightly as needed insomnia Has started nortriptyline 75 mg nightly for about 3 weeks and DT level 176, reduced to 50 mg HS early November. Increased pramipexole to 0.5 mg  to TID  off label for treatment resistant depression  Started Auvelity 1 AM & PM mid Feb. Received Spravato 84 mg today as scheduled.  Tolerated it well without nausea or vomiting headache or chest pain or palpitations.  Expected dissociation gradually resolved over the 2 hour observation period. She feels no longer depressed.   The pramipexole got her the rest of the way better.  Doesn't want to cut back on any tx bc afraid of relapse. Tolerating meds.  02/18/23 appt noted: Continues meds Adderall XR 20 mg every morning, stopped Wellbutrin XL 150 every morning, clonidine 0.2 mg twice daily, lorazepam 1 mg every 8 hours as needed.  Trazodone 50 mg tablets 1-2 nightly as needed insomnia Has started nortriptyline 75 mg nightly for about 3 weeks and DT level 176, reduced to 50 mg HS early November. Increased pramipexole to 0.5 mg  to TID  off label for treatment resistant depression  Started Auvelity 1 AM & PM mid Feb. Received Spravato 84 mg today as scheduled.  Tolerated it well without nausea or vomiting headache or chest pain or palpitations.  Expected dissociation gradually resolved over the 2 hour observation period. She feels no longer depressed except when ran out of Auvelity this week DT lack of availability.  Much worse  without it..   The pramipexole got her the rest of the way better.  Doesn't want to cut back on any tx bc afraid of relapse. Tolerating meds.   ECT-MADRS    Flowsheet Row Clinical Support from 01/08/2023 in [facility] Clinical Support from 08/06/2022 in [facility] Clinical Support from 07/04/2022 in [facility] Clinical Support from 05/21/2022 in [facility] Office Visit from 03/02/2022 in [facility]  MADRS Total Score 21 29 15 27  46        Past Psychiatric Medication Trials: fluoxetine, duloxetine, Viibryd, Pristiq, sertraline, citalopram,  Trintellix anxious and SI Wellbutrin XL 450 Auvelity 1 dose nortriptyline 75 mg nightly for about 3 weeks and DT level 176, reduced to 50 mg HS NR. Pramipexole 1 mg NR Adderall, Adderall XR, Vyvanse, Ritalin, Strattera low dose NR Lorazepam Trazodone  Depakote,  lamotrigine cog complaints Lithium remotely Abilify 7.5  Vraylar 1.5 mg daily agitation and insomnia Rexulti insomnia Latuda 40 one dose, CO anxious and SI Seroquel XR 300 Olanzapine 10  At visit November 12, 2019. We discussed Patient developed an increasingly severe alcohol dependence problem since her last visit in January.  She went to [facility] and has had no alcohol since then except 1 day.  She never abused stimulants but they took her off the stimulants at [facility].  Her ADD was markedly worse.  The Wellbutrin did not help the ADD.   D history lamotrigine rash at 67 yo  Review of Systems:  Review of Systems  Constitutional:  Negative for fatigue.  Musculoskeletal:  Positive for back pain. Negative for arthralgias and joint swelling.       SP hip surgery October 2020  Neurological:  Negative for dizziness.  Psychiatric/Behavioral:  Positive for decreased concentration and dysphoric mood. Negative for agitation,  behavioral problems, confusion, hallucinations, self-injury, sleep disturbance and suicidal ideas. The patient is nervous/anxious. The patient is not hyperactive.        Forgetful at times about med recommendations.  Send present for him The only [place] she notices a it states it is unbelievable him how how much are her 3 steps around freedom of the present fall and just in the past decade  Medications: I have reviewed the patient's current medications.  Current Outpatient Medications  Medication Sig Dispense Refill   amLODipine (NORVASC) 2.5 MG tablet Take 2.5 mg by mouth daily.     amphetamine-dextroamphetamine (ADDERALL XR) 20 MG 24 hr capsule Take 1 capsule (20 mg total) by mouth every morning. 30 capsule 0   amphetamine-dextroamphetamine (ADDERALL XR) 20 MG 24 hr capsule Take 1 capsule (20 mg total) by mouth every morning. 30 capsule 0   buPROPion (WELLBUTRIN XL) 150 MG 24 hr tablet TAKE 3 TABLETS BY MOUTH DAILY (Patient taking differently: Take 150 mg by mouth daily.) 270 tablet 1   cloNIDine (CATAPRES) 0.2 MG tablet TAKE 1 TABLET BY MOUTH 2 TIMES DAILY. 180 tablet 0   Esketamine HCl, 84 MG Dose, (SPRAVATO, 84 MG DOSE,) 28 MG/DEVICE SOPK USE 3 SPRAYS IN EACH NOSTRIL ONCE EVERY WEEK 3 each 1   iron polysaccharides (NIFEREX) 150 MG capsule TAKE 1 CAPSULE BY  MOUTH EVERY DAY 90 capsule 1   LORazepam (ATIVAN) 1 MG tablet Take 1 tablet (1 mg total) by mouth every 8 (eight) hours as needed. for anxiety 90 tablet 1   losartan (COZAAR) 50 MG tablet Take 50 mg by mouth daily.     nebivolol (BYSTOLIC) 2.5 MG tablet Take 2.5 mg by mouth daily.     nortriptyline (PAMELOR) 25 MG capsule Take 2 capsules (50 mg total) by mouth at bedtime. 180 capsule 0   pramipexole (MIRAPEX) 0.5 MG tablet Take 1 tablet (0.5 mg total) by mouth 3 (three) times daily. 90 tablet 1   traZODone (DESYREL) 50 MG tablet TAKE 1-2 TABLETS BY MOUTH NIGHTLY AS NEEDED FOR SLEEP 180 tablet 1   Dextromethorphan-buPROPion ER (AUVELITY)  45-105 MG TBCR Take 1 tablet by mouth 2 (two) times daily. 60 tablet 3   No current facility-administered medications for this visit.    Medication Side Effects: None  Allergies:  Allergies  Allergen Reactions   Metronidazole Shortness Of Breath and Other (See Comments)    Heart pounding   Ferrlecit [Na Ferric Gluc Cplx In Sucrose] Other (See Comments)    Infusion reaction 05/12/2019    Past Medical History:  Diagnosis Date   ADHD    Anemia    Anxiety    Arthritis    Depression    Heart murmur    i went to see a cardiologit slast eyar  and i had zero plaque,    PONV (postoperative nausea and vomiting)    Recovering alcoholic in remission     Family History  Problem Relation Age of Onset   Atrial fibrillation Mother    CAD Father     Past Medical History, Surgical history, Social history, and Family history were reviewed and updated as appropriate.   Please see review of systems for further details on the patient's review from today.   Objective:   Physical Exam:  There were no vitals taken for this visit.  Physical Exam Constitutional:      General: She is not in acute distress. Neurological:     Mental Status: She is alert and oriented to person, place, and time.     Coordination: Coordination normal.     Gait: Gait normal.  Psychiatric:        Attention and Perception: Attention and perception normal.        Mood and Affect: Mood is anxious and depressed. Affect is not tearful.        Speech: Speech is not rapid and pressured.        Behavior: Behavior is not slowed.        Thought Content: Thought content is not paranoid or delusional. Thought content does not include homicidal or suicidal ideation. Thought content does not include suicidal plan.        Cognition and Memory: Cognition normal. Memory is not impaired. She does not exhibit impaired recent memory.        Judgment: Judgment normal.     Comments: Insight intact. No auditory or visual  hallucinations. No delusions.  Depression resolved with pramipexole TID and relapsed when out of Auvelity.     Lab Review:     Component Value Date/Time   NA 137 01/12/2021 1430   NA 140 11/18/2018 1544   K 3.8 01/12/2021 1430   CL 108 01/12/2021 1430   CO2 22 01/12/2021 1430   GLUCOSE 94 01/12/2021 1430   BUN 14 01/12/2021 1430   BUN 20 11/18/2018  1544   CREATININE 0.82 01/12/2021 1430   CALCIUM 8.9 01/12/2021 1430   PROT 6.6 01/12/2021 1430   ALBUMIN 3.9 01/12/2021 1430   AST 12 (L) 01/12/2021 1430   ALT 11 01/12/2021 1430   ALKPHOS 46 01/12/2021 1430   BILITOT 0.5 01/12/2021 1430   GFRNONAA >60 01/12/2021 1430   GFRAA >60 09/02/2019 0249   GFRAA >60 01/27/2019 0811       Component Value Date/Time   WBC 4.5 01/12/2021 1430   RBC 4.32 01/12/2021 1430   HGB 12.8 01/12/2021 1430   HGB 12.9 07/17/2019 0953   HCT 38.5 01/12/2021 1430   HCT 21.9 (L) 12/25/2018 1221   PLT 272 01/12/2021 1430   PLT 286 07/17/2019 0953   MCV 89.1 01/12/2021 1430   MCH 29.6 01/12/2021 1430   MCHC 33.2 01/12/2021 1430   RDW 12.4 01/12/2021 1430   LYMPHSABS 1.4 01/12/2021 1430   MONOABS 0.4 01/12/2021 1430   EOSABS 0.0 01/12/2021 1430   BASOSABS 0.0 01/12/2021 1430    No results found for: "POCLITH", "LITHIUM"   No results found for: "PHENYTOIN", "PHENOBARB", "VALPROATE", "CBMZ"   .res Assessment: Plan:    Recurrent major depression resistant to treatment - Plan: Dextromethorphan-buPROPion ER (AUVELITY) 45-105 MG TBCR  Generalized anxiety disorder  Attention deficit hyperactivity disorder (ADHD), predominantly inattentive type  Insomnia due to mental condition  Accelerated hypertension   She has treatment resistant major depression ongoing with 50% better with Spravato. Have  discussed some of her  abnormal behaviors last year leading to this depressive episode getting worse which she says were associated with heavy use of delta 8 and not a manic episode.  She realizes now  that that was not good for her.  She stopped all use of other drugs including those available over-the-counter such as delta 8 or any other THC related products.  She is no longer having any of those types of behaviors and instead is depressed.  She is experiencing more pleasure and is less blunted and enjoying more and better inteest versus before the Spravato.  She also feels worse if misses a dose of Spravato.  Depression resolved recently after increasing pramipexole 0.5 mg TID, brief relapse when out of Auvelity  Patient was administered Spravato 84 mg intranasally today.  The patient experienced the typical dissociation which gradually resolved over the 2-hour period of observation.  There were no complications.  Specifically the patient did not have nausea or vomiting or headache.  Blood pressures monitored at the 40-minute and 2-hour follow-up intervals.  Borderline high.  By the time the 2-hour observation period was met the patient was alert and oriented and able to exit without assistance.  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  See nursing note for further details.She wants to continue Spravato. We discussed discussed the side effects in detail as well as the protocol required to receive Spravato.   Failed multiple antidepressants.  Many of them were not actual failures but intolerances and it is unclear whether some of that was more connected with anxiety than true side effects.  1 example is the JordanLatuda.  In general she does not want to try anything but an antidepressant but has failed all major categories of antidepressants except TCAs and MAO inhibitors which have not been tried until now.  Started nortriptyline 75 mg nightly.  Serum level 176.  So Reduced to 50 mg HS. Consider stopping it.  Discussed side effects in detail.  Needs more time to help. Extensive discussion previously about her  ambivalence about meds and missatributing sx of depression as SE of meds.    She is tolerating Auvelity.  Continue 1  BID bc more depressed without it.  Worse with less pramipexole. Increased to 0.5 mg TID on about end of Feb 2024 and markedly better Dosing range off label for depression ranges from 1-5 mg daily.  Disc risk compulsive impulsive behavior and manis.    Started Spravato 84 mg twice weekly on 03/16/2022.  Now on weekly administration  Adderall  XR 20 mg AM   Ativan 1 mg 3 times daily as needed anxiety but try to cut it back. Is not ideal to use benzodiazepine with stimulant but because of the severity of her symptoms it has been necessary.  Hope to eventually eliminate the benzodiazepine.  Expected as her depression improves her anxiety will improve as well.  However lately her anxiety has been unmanageable.  We will expect that to improve as the depression improves.  She has headed insturctions to reduce this.  Continue clonidine 0.2 mg BID off label for anxiety and helps BP partially. BP is better controlled but not consistent.  Consider increasing amlodipine.  Also on losartan 50  Discussed potential benefits, risks, and side effects of stimulants with patient to include increased heart rate, palpitations, insomnia, increased anxiety, increased irritability, or decreased appetite.  Instructed patient to contact office if experiencing any significant tolerability issues. She wants to return to usual dose of Adderall for ADD bc of mor poor cognitive function with reduction.  Also discussed that depression will impair cognitive function.  Disc risk polypharmacy.  Reevaluate meds after better with pramipexole.  Relapsed after running out of Auvelity so needs clearly it and pramipexole.  Rec keep track of BP and discuss with PCP. It has been up and down.  Sometimes needs extra clonidine before Spravato  Has Maintained sobriety  FU with Spravato weekly   Meredith Staggers, MD,  DFAPA     Please see After Visit Summary for patient specific instructions.  No future appointments.                 No orders of the defined types were placed in this encounter.      -------------------------------

## 2023-02-18 NOTE — Telephone Encounter (Signed)
FYI sent Rx Auvelity BID to [facility] to improve availability

## 2023-02-18 NOTE — Addendum Note (Signed)
Addended by: Kirstie Peri on: 02/18/2023 07:33 PM   Modules accepted: Orders

## 2023-02-20 NOTE — Progress Notes (Signed)
NURSES NOTE:   Patient arrived for her weekly 84mg  Spravato treatment. Pt is being treated for Treatment Resistant Depression this is #55 treatment, pt will be receiving 84 mg which will continue to be her maintenance dose, she receives weekly treatments.  Patient arrived and taken to treatment room. Confirmed she had a ride home which is her husband would be coming back to pick up pt when done and sometimes she needs to use [facility] if he is unable to pick her up. Pt's Spravato is ordered through [employer] and delivered to office, all Spravato medication is stored at doctors office per REMS/FDA guidelines. The medication is required to be locked behind two doors per FDA/REMS Protocol. Medication is also disposed of properly per regulations. Pt's prior authorization for Spravato 84 mg was renewed with an approval through 07/17/2023 with [facility]. All treatments with vital signs documented with Spravato REMS per protocol of being a treatment center.    Began taking patient's vital signs at 1:20 PM 116/76, pulse 78. Pulse Ox 99%. Gave patient first dose 28 mg nasal spray, each nasal spray administered in each nostril as directed and waited 5 minutes between the second and third dose. All 3 doses given pt did not complain of any nausea/vomiting, given a cup of water due to the taste after the administration of Spravato.  She listens to [employer] with spa or relaxing music.  Checked 40 minute vitals at 2:05 PM, 82/54, pulse 66, Pulse Ox 92%. Explained she would be monitored for a total time of 120 minutes. Discharge vitals were taken at 3:25 PM 97/61 P 60, 99% Pulse Ox. Dr. Jennelle Human met with pt today and discussed her medication. I walked pt to elevator, where her husband met her for the ride home. Recommend she go home and sleep or just relax on the couch. No driving, no intense activities. Verbalized understanding. Nurse was with pt a total of 70 minutes for clinical. Pt will be scheduled next Monday, April  15th. Pt instructed to call office with any problems or questions.      LOT [id number]

## 2023-02-24 NOTE — Progress Notes (Signed)
NURSES NOTE:   Patient arrived for her weekly 84mg  Spravato treatment. Pt is being treated for Treatment Resistant Depression this is #54 treatment, pt will be receiving 84 mg which will continue to be her maintenance dose, she receives weekly treatments.  Patient arrived and taken to treatment room. Confirmed she had a ride home which is her husband would be coming back to pick up pt when done and sometimes she needs to use [facility] if he is unable to pick her up. Pt's Spravato is ordered through [employer] and delivered to office, all Spravato medication is stored at doctors office per REMS/FDA guidelines. The medication is required to be locked behind two doors per FDA/REMS Protocol. Medication is also disposed of properly per regulations. Pt's prior authorization for Spravato 84 mg was renewed with an approval through 07/17/2023 with [facility]. All treatments with vital signs documented with Spravato REMS per protocol of being a treatment center.    Began taking patient's vital signs at 1:25 PM 120/96, pulse 88. Pulse Ox 99%. Gave patient first dose 28 mg nasal spray, each nasal spray administered in each nostril as directed and waited 5 minutes between the second and third dose. All 3 doses given pt did not complain of any nausea/vomiting, given a cup of water due to the taste after the administration of Spravato.  She listens to [employer] with spa or relaxing music.  Checked 40 minute vitals at 2:10 PM, 76/53, pulse 74, Pulse Ox 92%. Explained she would be monitored for a total time of 120 minutes. Discharge vitals were taken at 3:15 PM 87/57 P 57, 99% Pulse Ox. Dr. Jennelle Human met with pt today and discussed her medication. I walked pt to elevator, where her husband met her for the ride home. Recommend she go home and sleep or just relax on the couch. No driving, no intense activities. Verbalized understanding. Nurse was with pt a total of 70 minutes for clinical. Pt will be scheduled next Monday, April  8th. Pt instructed to call office with any problems or questions.      LOT [id number]

## 2023-02-25 ENCOUNTER — Ambulatory Visit (INDEPENDENT_AMBULATORY_CARE_PROVIDER_SITE_OTHER): Payer: 59 | Admitting: Psychiatry

## 2023-02-25 ENCOUNTER — Other Ambulatory Visit: Payer: Self-pay

## 2023-02-25 ENCOUNTER — Encounter: Payer: Self-pay | Admitting: Psychiatry

## 2023-02-25 ENCOUNTER — Ambulatory Visit: Payer: 59

## 2023-02-25 VITALS — BP 96/71 | HR 62

## 2023-02-25 DIAGNOSIS — I10 Essential (primary) hypertension: Secondary | ICD-10-CM

## 2023-02-25 DIAGNOSIS — F5105 Insomnia due to other mental disorder: Secondary | ICD-10-CM

## 2023-02-25 DIAGNOSIS — F900 Attention-deficit hyperactivity disorder, predominantly inattentive type: Secondary | ICD-10-CM

## 2023-02-25 DIAGNOSIS — F411 Generalized anxiety disorder: Secondary | ICD-10-CM | POA: Diagnosis not present

## 2023-02-25 DIAGNOSIS — F339 Major depressive disorder, recurrent, unspecified: Secondary | ICD-10-CM

## 2023-02-25 NOTE — Progress Notes (Signed)
Laura Chang [id number] 05-Oct-1956 67 y.o.  Subjective:   Patient ID:  BIANCHA Chang is a 67 y.o. (DOB 09-01-1956) female.  Chief Complaint:  Chief Complaint  Patient presents with   Follow-up   Depression   Anxiety   ADD      [name] presents to the office today for follow-up of depression and anxiety and ADD.  seen November 12, 2019.  Melted down in 2020.  Went to [employer] in July.  No withdrawal.  1 drink since then.  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 She was on no stimulant and no SSRI but was taking Strattera and Wellbutrin.  The following changes were made. Stop Strattera. OK restart stimulant bc severe ADD Restart Adderall 1 daily for a few days and if tolerated then restart 1 twice daily. If not tolerated reduce the dosage if needed. May need to stop Wellbutrin if not tolerating the stimulant.  Yes.  DC Wellbutrin Restart Prozac 20 mg daily.  February 2021 appointment with the following noted: Completed grant proposal.  Couldn't doit without Adderall.  Sold a bunch of work.   Adderall XR lasts about 3 pm.  Strength seems about right.  BP been OK.  Not jittery.   Stopped Wellbutrin but had no SE. Mood drastically better with grant proposal and back on fluoxetine.  Less depressed and lethargic.  No anxiety.  Cut back on coffee. Started back with devotions and stronger faith. Plan: Continue Prozac 20 mg daily. May have to increase the dose at some point in the future given that she usually was taking higher dosages but she is getting good response at this time. Restart Wellbutrin off label for ADD since can't get 2 ADDERALL daily. 150 mg daily then 300 mg daily. She can adjust the dose between 150 mg and 300 mg daily to get the optimal effect.   05/11/2020 appointment with the following noted: Has been inconsistent with Prozac and Wellbutrin. Not sure of the effect of Wellbutrin. Biggest deterrent in work is anxiety.   Some of the work is conceptual and difficult at times.  Can feel she's not up to a project at times.  Overall is OK but would like a steadier benefit from stimulant.  Exhausted from managing concentration and keeping up with things from the day.  Loses things.  Not good keeping up with schedule. Overall productive and emotionally OK. Can feel Adderall wear off. Mood is better in summer and worse in the winter.   F died in 10-15-2023 and that is a loss. No SE Wellbutrin. Still attends AA meetings.  Real benefit from [facility] last year. Recognizes effect of anemia on ADD and mood.  Had iron infusions last winter. Plan:  Wellbutrin off label for ADD since can't get 2 ADDERALL daily. 150 mg daily then 300 mg daily.  01/24/2021 appointment with following noted: Doing a program called [employer] mindfulness app since Xmas.  CBT app helped the depression.  App helped her focus better.  Lost sign weight. Writing a lot. Before Xmas felt depressed and started negative thinking worse, self denigrating. Not drinking. More isolated.   Recognizes mo is narcissist.    Didn't tell anyone she was born until 3 mos later.  M aloof and uninterested in pt.  Lied about her birthday.  Mo lack of affection even with pt's kids. Going to [place] for a year and it helped her to quit drinking. Also misses kids being gone  with a hole also. Plan: No med changes  05/04/2021 appointment with the following noted: Therapist [name] thinks she's manic. Lost weight to 144#.   States she is still sleeping okay.  Admits she is hyper and recognizes that she is likely manic.  She feels great, euphoric with an increased sense of spiritual connectedness to God.  She has racing thoughts and talks fast and talks a lot and this is noted by her husband.  He thinks she is a bit hyper.  She has been able to maintain sobriety although she will have 1 glass of wine on special occasions but does not drink by herself.  She is not drinking to  excess.  She denies any dangerous impulsivity.  She is clearly not depressed and not particularly anxious.  She has no concerns about her medication and she has been compliant.  06/16/21 appt noted: So much better.  Going through a lot but the manic thing happened on top of it.  So much slower.  Didn't feel like losing anything with risperidone.  Likes the Adderalll at 10 mg. Some drowsiness in the AM and very drowsy from risperidone 2 mg HS. Prayer life is better. Handling stress better. Less depressed with risperidone. Still likes trazodone. Sleeps well. Plan: Reduce Prozac to 10 mg daily.  Consider stopping it because it can feel the mania however she is reluctant to do that because she fears relapse of depression. Reduce risperidone to 1.5 mg nightly due to side effects.  Discussed risk of worsening mania.  07/25/2021 appointment with the following noted: Misses the Adderall and hard to function without it. Depressed now. Heavy chest.  Anxious and guilty.  Body feels heavy.   Hates Wellbutrin.   Plan: Increase fluoxetine to 20 mg daily Add Abilify 1/2 of 15 mg tablet daily Wean wellbutrin by 1 tablet each week  bc she feels it is not helpful and DT polypharmacy Reduce risperidone to 1 daily for 1 week and stop it. Disc risk of mania. Increase Adderall to XR 20 mg AM  08/08/21 Much less depressed and starting to feel normal I feel a lot better. No SE.  Speech normal off risperidone. Sleeping OK on trazaodone and enough.   Noticed benefit from Adderall again. Plan: continue fluoxetine to 20 mg daily Continue Abilify 1/2 of 15 mg tablet daily for depression and mania continue Adderall to XR 20 mg AM  10/10/2021 phone call: Pt stated she feels like the Abilify should be decreased to 23m.She said she is depressed but rational and not suicidal.She has an appt Monday and can wait until then if you prefer. MD response: Reduce the Abilify to 7.5 mg every other day.  We will meet on 10/16/2021  and decide what to do from there.  10/16/2021 appointment with the following noted: More depressed.  Most depressed I've ever been.  Just numb.  Sense of grief.   Thinks the manic episode was unlike anything else she ever had.  Doesn't want to medicate against it.  Don't enjoy people.  Easily overwhelmed.  Had some death thoughts but not suicidal.  Has been functional.  Feels better today after reducing Abilify to every other day but she is only been doing that for 3 days. A/P: Episode of post manic depression was explained. continue fluoxetine to 20 mg daily Hold Abilify for 1 week then resume Abilify 1/2 of 15 mg tablet every other day for depression and mania continue Adderall to XR 20 mg AM  10/27/2021  appointment with the following noted: I'm doing so much better.  Handling the depression better. Better self talk and spiritual focus has helped.   Dep 6/10 manifesting as anxiety with low confidence.   F died 2  years ago and M 67 yo and is dependent . She is working hard to feel better but still feels depressed.  She almost feels like she has a little more anxiety since restarting Abilify every other day. Plan: continue fluoxetine to 20 mg daily DC Abilify .  Vrayalar 1.5 mg QOD to try to get rid of depression ASAP. continue Adderall to XR 20 mg AM  11/10/2021 appointment with the following noted: Busy with Xmas and it was fun with family but then a big let down.  Did well with it.  Functioned well with it.  Working hard on things with depression.  Not shutting down. Not sure but feels better today but yesterday was hard.  Difficulty dealing with mother.  She won't do anything to help herself.  Yesterday with her all day.  Won't do PT and has isolated herself.    Lack of confidence.   No SE with Vraylar.  11/24/21 urgent appointment appt noted: More and more depressed.   So anxious and doesn't want to be alone but can do so. No appetite. Hurts inside. Has had some fleeting suicidal  thoughts but would not act on them.  Tolerating meds. Has been consistent with Vraylar 1.5 mg every other day, fluoxetine 20 mg daily Plan: Increase Vraylar to 1.5 mg daily Change Prozac to Trintellix 10 mg daily. Discussed side effects of each continue Adderall to XR 20 mg AM  12/27/2021 appointment with the following noted: Not OK.  I feel less depressed but feels bat shit. Not sleeping well.  Extremely anxious. Off and on sleep. 3-4 hours of sleep.   Still having daily SI.  But also become obvious has so much to do.  Overwhelmed by tasks.   Needs anxiety meds to just function. Not more motivated.  Walked yesterday.   Feels afraid like in trouble but not irritable or angry. DC DT agitation Vraylar to 1.5 mg daily Change Prozac to Trintellix 10 mg daily. Hold Adderall to XR 20 mg AM Clonidine 0.1 1/2 tablet twice daily for 2 days and if needed for anxiety and sleep increase to 1 twice daily Ok temporary Ativan 1 mg 3 times daily as needed anxiety  01/05/22 appt noted: Off fluoxetine and  Trintellix.  Only on Ativan, trazodone and Adderall XR 20 plus added clonidine 0.1 mg BID Didn't think she needed to start Trintellix. Not taking Ativan.   Didn't like herself last week. Feels some better today. Wonders if the manic sx Not agitated.  Anxiety kind of calmed down.  A lot to be anxious about situationally.  $ stress. Concerns about downers with meds. Can't access normal personality. ? Lethargy and inability to talk as sE. Plan: Latuda 20-40 mg daily with food. Adderall to XR 20 mg AM Clonidine 0.1 1/2 tablet twice daily  reduce dose to be sure no SE Ok temporary Ativan 1 mg 3 times daily as needed anxiety  01/19/22 appt noted: Taking Latuda 20 mg daily.  Took 40 mg once and felt anxious and  SI Still depressed and not very reactive Anxiety mainly about the depression and fears of the future. She wants to revisit manic sx and thinks it was maybe bc taking delta 8 bc was taking a lot  of it so still  doesn't think she's classic bipolar. She wants to only take Prozac bc thinks Latuda is perpetuating depression. Says the delta 8 was very psychaedelic.  When not taking it was not manic.  Sleeping ok again.  Plan: Per her request DC Latuda 20-40 mg daily with food. She wants to continue Prozac alone AMA  Adderall to XR 20 mg AM Clonidine 0.1 1/2 tablet twice daily  reduce dose to be sure no SE Ok temporary Ativan 1 mg 3 times daily as needed anxiety  01/23/2022 phone call complaining of increased anxiety since stopping Latuda.  She will try increasing clonidine.  01/26/2022 phone call not feeling well and wanted to restart the Vraylar.  However notes indicate that had made her agitated therefore she was encouraged to pick up samples of Rexulti 1 mg and start that instead.  02/06/2022 phone call: Stating she felt the Rexulti was helping with depression but she was not sleeping well and obsessing over things.  She was encouraged to increase Rexulti to 2 mg daily and increase trazodone for sleep.  02/09/2022 appointment with the following noted: This was an urgent work in appointment No sleep last night with trazodone 100 mg HS Nothing really better depression or anxiety. Ruminating negative anxious thoughts. Did not tolerate Rexulti because it was causing insomnia.  Does not think it helped depression.  Lacks emotion that she should have.  Lacks her usual personality.  Some hopeless thoughts.  Some death thoughts.  Some suicidal thoughts without plan or intent Plan: DC Rexulti and Prozac & DC trazodone Adderall to XR 20 mg AM Clonidine 0.1 1/tablet twice daily  reduce dose to be sure no SE Ok temporary Ativan 1 mg 3 times daily as needed anxiety Start Seroquel XR 150 mg nightly  03/02/2022 appointment: Langley Gauss called back a few days after starting Seroquel stating it was making her more anxious and more depressed.  This seemed unlikely as this medicine rarely ever causes anxiety.   She stopped the medication waited 3 days and called back still had anxiety and depression but thought perhaps the anxiety was a little better.  She did not want to take the Seroquel. She knew about the option of Spravato and wanted to pursue that. Now questions whether to return to Seroquel while waiting to start Spravato bc feels just as bad without it and knows she didn't give it enough time to work.   MADRS 46  ECT-MADRS    Flowsheet Row Clinical Support from 01/08/2023 in [place] from 08/06/2022 in [place] from 07/04/2022 in [place] from 05/21/2022 in [place] Office Visit from 03/02/2022 in [facility]  MADRS Total Score '21 29 15 27 '$ 46      03/14/22 appt noted: Pt received Spravato 56 mg first dose today with some dissociative sx which were not severe.  She was anxious prior to the administration and felt better after receiving lorazepam 1 mg.  No NV, or HA. Wants to continue Spravato. Ongoing depression and desperate to feel better.  I'm not myself DT deprsssion which is most severe in recent history.  Anhedonia.  Low motivation.  Social avoidance. Continues to think all recent med trials are making her worse.  Sleep ok with Seroquel.  03/16/22 appt noted: Received Spravato 84 mg for the first time.  some dissociative sx which were not severe.  She was anxious prior to  the administration and felt better after receiving lorazepam 1 mg.  No NV, or HA. Wants to continue Spravato.   Does not feel any better or different since the last appt.  Ongoing depression.  Ongoing depression and desperate to feel better.  I'm not myself DT deprsssion which is most severe in recent history.  Anhedonia.  Low motivation.  Social avoidance. Continues to think all recent med trials are making her worse.  Sleep  ok with Seroquel.  Does not want to continue Seroquel for TRD.  03/20/2022 appointment noted: Came for Spravato administration today.  However blood pressure was significantly elevated approximately 180/115.  She was given lorazepam 1 mg and clonidine 0.2 mg to try to get it down. She states she regretted stopping the Seroquel XR 300 mg tablets.  She now realizes it was helpful.  She did not sleep much at all last night.  She did not take the Adderall this morning. 2 to 3 hours after arrival blood pressure was still elevated at  170/110, 62 pulse.  For Spravato administration was canceled for today.  She admits to being anxious and depressed.  She is not suicidal.  She is highly motivated to receive the Spravato.  We discussed getting it tomorrow.  03/22/2022 appointment noted: Patient's blood pressure was never stable enough yesterday in order to get her in for Spravato administration.  She was encouraged to see her primary care doctor.  It is better today.  03/26/2022 appointment with the following noted: Blood pressure was better.  Saw her primary care doctor who started on oral Bystolic 2.5 mg daily. Received Spravato 84 mg today as scheduled.  Tolerated it well without nausea or vomiting headache or chest pain or palpitations.  Her blood pressure was borderline but manageable. She remains depressed and anxious.  She is ambivalent about the medicine and desperate to get to feel better.  Continues to have anhedonia and low energy and low motivation and reduced ability to do things.  Less social.  Not suicidal.  03/28/22 appt noted: Received Spravato 84 mg today as scheduled.  Tolerated it well without nausea or vomiting headache or chest pain or palpitations.  Her blood pressure was borderline but manageable. Has not seen any improvement so far.  Tolerating Seroquel.  Inconsistent with Bystolic and BP has been borderline high. Still depressed and anxious and anhedonia.  Low motivation, energy,  productivity. Taking quetiapine and tolerating XR 300 mg nightly.  04/04/22 appt noted: Received Spravato 84 mg today as scheduled.  Tolerated it well without nausea or vomiting headache or chest pain or palpitations.  Her blood pressure was borderline but manageable. Has not seen any improvement so far.  Tolerating Seroquel.   She still tends to think that the medications are making her worse.  She has said this about each of the recent psychiatric medicines including Seroquel.  However her husband thinks she is improved.  She also admits there is some improvement in productivity.  She still feels highly anxious.  She still does not enjoy things as normal.  She still feels desperate to improve as soon as possible. Has been taking Seroquel XR since 03/20/2022  04/10/22 appt noted: Received Spravato 84 mg today as scheduled.  Tolerated it well without nausea or vomiting headache or chest pain or palpitations.  Her blood pressure was borderline but manageable. Has not seen any improvement so far.  Tolerating Seroquel.  Doesn't like Seroquel bc she thinks it flattens here. 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  Tolerated it well without nausea or vomiting headache or chest pain or palpitations.  Her blood pressure was borderline but manageable. Has not seen any improvement so far.  Tolerating Seroquel.  Doesn't like Seroquel bc she thinks it flattens her. Received Spravato 84 mg today as scheduled.  Tolerated it well without nausea or vomiting headache or chest pain or palpitations.  Her blood pressure was borderline but manageable. Has not seen any improvement so far.  Tolerating Seroquel.  Doesn't like Seroquel bc she thinks it flattens here.  We discussed her ambivalence about it. She is starting Auvelity and has tolerated it  the last 2 days without side effect.  She still does not feel like herself and feels flat and not enjoying things with suppressed expressed emotion  04/17/2022 appointment with the following noted: Received Spravato 84 mg today as scheduled.  Tolerated it well without nausea or vomiting headache or chest pain or palpitations.  Her blood pressure was borderline but manageable. Has not seen any improvement so far.  Tolerating Seroquel.  Doesn't like Seroquel bc she thinks it flattens her. She has been tolerating the Auvelity 1 in the morning without side effects for about a week.  She has not noticed significant improvement so far.  She still feels depressed and flat and not herself.  Other people notice that she is flat emotionally.  She is not suicidal.  She does feel discouraged that she is not getting better yet.  04/19/2022 appointment noted: Has increased Auvelity to 1 twice daily for 2 days, continues quetiapine XR 300 mg nightly, clonidine 0.3 mg twice daily, lorazepam 1 mg twice daily for anxiety and Adderall XR 20 mg in the morning. No obious SE but she still thinks quetiapine XR is making her feel down.  But not sedated Received Spravato 84 mg today as scheduled.  Tolerated it well without nausea or vomiting headache or chest pain or palpitations.  Her blood pressure was borderline but manageable. She still feels quite anxious and feels it necessary to take both the clonidine and lorazepam twice a day to manage her anxiety.  She has been consistently down and flat and not herself until yesterday afternoon she noted an improvement in mood and feeling much more like herself with her normal personality reemerging.  She was quite depressed in the morning with very dark negative thoughts.  She did not have those dark negative thoughts this morning.  She had a lot of questions about medication and when she was expecting to be improved and why she has not shown improvement up to now.  04/23/22 appt  noted: Has increased Auvelity to 1 twice daily for 1 week, continues quetiapine XR 300 mg nightly, clonidine 0.3 mg twice daily, lorazepam 1 mg twice daily for anxiety and Adderall XR 20 mg in the morning. No obious SE but she still thinks quetiapine XR is making her feel down.  But not sedated Received Spravato 84 mg today as scheduled.  Tolerated it well without nausea or vomiting headache or chest pain or palpitations.  She is still depressed but admits better function and is able to enjoy social interactions. Tolerating meds.  Would like to feel better for sure. Not herself.  Flat. Plan increase Auvelity to 1 tab BID as planned and reduce Quetiapine to 1/2 of ER 300 mg  bc NR for depression.  04/25/2022 appointment with the following noted: clonidine 0.3 mg twice daily, lorazepam 1 mg twice daily for anxiety and Adderall XR 20 mg in the morning. Seroquel XR 300 HS No obious SE but she still thinks quetiapine XR is making her feel down.  But not sedated Received Spravato 84 mg today as scheduled.  Tolerated it well without nausea or vomiting headache or chest pain or palpitations.  Called yesterday with more anxiety.  Had increased Auvelity for 1 day and reduced Seroquel XR for 1 day.  Felt restless and fearful  05/01/2022 appointment noted: clonidine 0.3 mg twice daily, lorazepam 1 mg twice daily for anxiety and Adderall XR 20 mg in the morning. Seroquel XR 150 HS, Auvelity 1 BID Received Spravato 84 mg today as scheduled.  Tolerated it well without nausea or vomiting headache or chest pain or palpitations.  Nurse has noted patient has called multiple times sometimes asking the same question repeatedly.  It is unclear whether she is truly forgetful or is just anxious seeking reassurance. Patient acknowledges ongoing depression as well as some anxiety but states she has felt a little better in the last couple of days.  She has reduced the Seroquel to 150 mg at night and has increased Auvelity to 1  twice daily but only for 1 day.  So far she seems to be tolerating it.  05/03/22 appt noted: clonidine 0.2 mg twice daily, lorazepam 1 mg twice daily for anxiety and Adderall XR 20 mg in the morning. Seroquel XR 150 HS, Auvelity 1 BID BP high this am about 170/100 and received extra clonidine 0.2 mg and came to receive Spravato.  Not dizzy, no SOB, nor CP but BP is still high Could not receive Spravato today bc BP high and pulse low at 30 ppm. Still depressed and anxious. Plan: continue trial Auvelity with Spravato She needs to get BP and pulse managed  05/08/22 TC: RTC  H Michael NA and mailbox full.  Could not leave message.  Pt  -  talked to she and H on speaker. H worried over wife.  Vacant stare.  Slurs words at times.  Not smiling. Reduced enjoyment.  Depression.  Withdrawn from usual activities.  Some irritability.  Anxious. Disc her concerns meds are making her worse.  Extensive discussion about her treatment resistant status.  There is a consistent pattern of not taking the medicines long enough to get benefit because she believes the meds are making her worse.  However the symptoms she describes as side effects are exactly the same symptoms that she had prior to taking the medication RX for  the depression.  So it is not clear that these are actual side effects. This is true about the 2 most recent meds including Seroquel and Auvelity.  Recommend psychiatric consultation in hopes of improving her comfort level with taking prescribed medications for a sufficient length of time to provide benefit. Extensive discussion about ECT is the treatment of choice for treatment resistant depression.  Spravato may work if she can comply with consistency.  There are medication options but they take longer to work.   Plan:  Reduce clonidine to 0.1 mg BID DT bradycardia.  Talk with PCP about BP and low pulse problems which are interfering with her consistent compliance with Spravato.   Limit lorazepam to  3 -4  mg daily max. Excess use is the cause of slurring speech.  She must stop excess use or will have to stop the med. Stop Auvelity per  her request.  But she has only been on the full dose for a little over a week and clearly has not had time to get benefit from it.  She thinks maybe it is making her more anxious. Reduce Seroquel from 150XR to 50 -100 mg at night IR.  She couldn't sleep when stopped it completely. Will not start new antidepressant until her SE issues are resolved or not. Get second psych opinion from Yehuda Budd MD or another psychiatrist.  H's sister is therapist in [place] [facility], MD, [facility]  05/16/2022 appointment with the following noted: Received Spravato 84 mg today as scheduled.  Tolerated it well without nausea or vomiting headache or chest pain or palpitations.  She stopped Auvelity as discussed last week. On her own, without physician input, she restarted Wellbutrin XL 450 mg every morning today.  She had taken it in the past.  She feels jittery and anxious. She feels less depressed than she did last week.  But she is still depressed without her usual range of affect.  She still is less social and less motivated than normal. Her primary care doctor increased the dose of losartan Plan: Stop Seroquel Reduce Wellbutrin XL to 300 mg every morning.  Starting the dose at 450 every morning is likely causing side effects of jitteriness and it should not be started at that have a dosage. Recommend she not change meds on her own without MDM put  05/23/2022 appointment with the following noted: Received Spravato 84 mg today as scheduled.  Tolerated it well without nausea or vomiting headache or chest pain or palpitations.  Has not dropped seroquel XR 300 mg 1/2 tablet nightly bc couldn't sleep without it. Has not tried lower dose quetiapine 50 mg HS Still feels depressed.   BP is better managed so far, just saw PCP.  BP is better today and infact is low today. Dropped  clonidine as directed from 0.3 mg BID bc inadequate control of BP to 0.2 mg BID.  However she wants to increase it back to 0.3 mg twice daily because she feels it helped her anxiety better.  Wonders about increasing Wellbutrin for depression.  However she has only been on 300 mg a day for a week.  She was on 450 mg daily in the past.  06/06/22 appt noted: Received Spravato 84 mg today as scheduled.  Tolerated it well without nausea or vomiting headache or chest pain or palpitations.  She is still depressed and anxious.  She wants to try to stop the Seroquel but cannot sleep without some of it.  She is taking lorazepam 1 mg 4 times daily and still having a lot of anxiety.  She wants to increase clonidine back to 0.3 mg twice daily.  She hopes for more improvement She recently went for a second psychiatric opinion as suggested the results of that are pending.  06/11/22 appt noted: Received Spravato 84 mg today as scheduled.  Tolerated it well without nausea or vomiting headache or chest pain or palpitations.  She is still depressed and anxious. Without much change.  Still hopeless, anhedonia, reduced inteterest and motivation.  Tolerating meds. Disc concerns Spravato is not hleping much. Plan: stop Seroquel and start olanzapine 10 mg HS for TRD and anxiety.  06/13/2022 appointment noted: Received Spravato 84 mg today as scheduled.  Tolerated it well without nausea or vomiting headache or chest pain or palpitations.  She is still depressed and anxious. Without much change.  Still hopeless, anhedonia, reduced inteterest  and motivation.  Tolerating meds. Disc concerns Spravato is not helping much as hoped but is improving a bit in the last week. Tolerating meds. Continues Wellbutrin XL 450 AM, tolerating recently started olanzapine  10 mg HS. Sleep is good.   Pending appt with [place] consult.  06/18/22 appt noted: Received Spravato 84 mg today as scheduled.  Tolerated it well without nausea or vomiting  headache or chest pain or palpitations.  Tolerating meds. Continues Wellbutrin XL 450 AM, tolerating recently started olanzapine  10 mg HS. Continues Adderall XR 20 amd and has tried to reduce lorazepam to '1mg'$  TID Sleep is good.   Pending appt with [place] consult. Depression is a little bit better in the last week with a little improvement in emotional expression and interest.  She is pushing herself to be more active.  Her daughter thought she was a little better than she has been.  However she is still depressed and still not her normal self with anhedonia and reduced emotional expressiveness.  06/20/22 appt noted: Received Spravato 84 mg today as scheduled.  Tolerated it well without nausea or vomiting headache or chest pain or palpitations.  Tolerating meds with a little sleepiness. Continues Wellbutrin XL 450 AM, tolerating recently started olanzapine  10 mg HS. Continues Adderall XR 20 amd and has tried to reduce lorazepam to '1mg'$  TID Sleep is good.   Mood is improving.  Better funciton.  Anxiety is better with olanzapine. Still not herself and depression not gone with some anhedonia and social avoidance and feeling overwhelmed.  8/14 2023 received Spravato 84 mg 06/27/2022 received Spravato Spravato 84 mg 07/02/2022 received Spravato 84 mg 07/04/2022 received Spravato 84 mg  07/09/2022 appointment noted: Received Spravato 84 mg today as scheduled.  Tolerated it well without nausea or vomiting headache or chest pain or palpitations.  Expected dissociation and feels less depressed with resolution of negative emotions immediately after Spravato and then depression, anxiety creep back in. Continues meds Adderall XR 20 mg every morning, Wellbutrin XL 450 every morning, clonidine 0.1 mg twice daily, lorazepam 1 mg every 6 hours as needed, olanzapine increased from 7.5 to 10 mg nightly on  Tolerating meds.  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  She does note some increased appetite and is somewhat concerned about that but has not gained significant amounts of weight. She has had the [place] consultation which was initially denied but she knows it can be appealed.  However because she is improving with Spravato plus the other medications now she wants to continue the current treatment plan.  07/18/22 appt noted: Continues meds Adderall XR 20 mg every morning, Wellbutrin XL 450 every morning, clonidine 0.1 mg twice daily, lorazepam 1 mg every 6 hours as needed, olanzapine increased from 7.5 to 10 mg nightly on 07/04/2022. Received Spravato 84 mg today as scheduled.  Tolerated it well without nausea or vomiting headache or chest pain or palpitations.  Expected dissociation and feels less depressed with resolution of negative emotions immediately after Spravato and then depression, anxiety creep back in. Continues meds Adderall XR 20 mg every morning, Wellbutrin XL 450 every morning, clonidine 0.1 mg twice daily, lorazepam 1 mg every 6 hours as needed, olanzapine increased from 7.5 to 10 mg nightly on  Tolerating meds.  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  She does note some increased appetite and is somewhat concerned about that but has not gained significant amounts of weight. She has had the [place] consultation which was initially denied but she knows it can be appealed. She continues to have chronic ambivalence about psychiatric medicines and initially tends to blame her depressive symptoms such as decreased concentration and feeling flat on what ever medicine she currently is taking even though she had the same symptoms before the current medicines were started.  Then after discussion she does admit that her depressive symptoms are improved since adding  olanzapine but still has those residual symptoms noted.  07/23/22 received Spravato 84 mg   07/30/2022 appointment noted: Received Spravato 84 mg today as scheduled.  Tolerated it well without nausea or vomiting headache or chest pain or palpitations.  Expected dissociation and feels less depressed with resolution of negative emotions immediately after Spravato and then depression, anxiety creep back in. Continues meds Adderall XR 20 mg every morning, Wellbutrin XL 450 every morning, clonidine 0.1 mg twice daily, lorazepam 1 mg every 6 hours as needed, olanzapine increased from 7.5 to 10 mg nightly on  She has been inconsistent with olanzapine because she continues to be ambivalent about the medications in general and thinks that perhaps the 10 mg is making her feel blunted.  She continues to feel some depression.  She had a good day this week and but still feels somewhat depressed and persistently anxious. Plan: be consistent with olanzapine 10 mg HS for TRD and longer trial for potential benefit for anxiety.  Has not taken it consistently.  08/06/2022 appointment noted: Received Spravato 84 mg today as scheduled.  Tolerated it well without nausea or vomiting headache or chest pain or palpitations.  Expected dissociation and feels less depressed with resolution of negative emotions immediately after Spravato and then depression, anxiety creep back in. Continues meds Adderall XR 20 mg every morning, Wellbutrin XL 450 every morning, clonidine 0.1 mg twice daily, lorazepam 1 mg every 6 hours as needed, olanzapine i 10 mg nightly  She continues to feel depressed but is about 50% better with Spravato.  She is still not herself.  She still has anhedonia.  She still is not her able to engage socially in the typical ways.  She is not jovial and outgoing like normal.  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  She continues to feel that her personality is dampened down.  There is a question about whether it is related to depression or medication. Plan: continue olanzapine 10 for longer trial for TRD and severe anxiety.  08/13/22 appt noted:  Received Spravato 84 mg today as scheduled.  Tolerated it well without nausea or vomiting headache or chest pain or palpitations.  Expected dissociation and feels less depressed with resolution of negative emotions immediately after Spravato and then depression, anxiety creep back in. Continues meds Adderall XR 20 mg every morning, Wellbutrin XL 450 every morning, clonidine 0.1 mg twice daily, lorazepam 1 mg every 6 hours as needed, olanzapine i 10 mg nightly  She still does not feel herself.  Still struggles with depression and low motivation and reduced social engagement and reduced interest and reduced emotional expression.  She is somewhat better with the medicines plus Spravato.  She still believes the Spravato makes her blunted and  is not sure how much it helps her anxiety.  She can have good days when her family is around and she is engaged.  She still wants to stop the olanzapine. She has apparently continued to take the trazodone despite having been told to stop it when she started olanzapine.  She feels like she needs the trazodone. Plan: DC olanzapine and Start nortriptyline 25 mg nightly and build up to 75 mg nightly and then check blood level.    08/27/2022 appointment noted: Received Spravato 84 mg today as scheduled.  Tolerated it well without nausea or vomiting headache or chest pain or palpitations.  Expected dissociation and feels less depressed with resolution of negative emotions immediately after Spravato and then depression, anxiety creep back in. Continues meds Adderall XR 20 mg every morning, Wellbutrin XL 450 every morning, clonidine 0.1 mg twice daily, lorazepam 1 mg every 6 hours as needed. Stopped olanzapine and started  nortriptyline which she has taken for about a week is 75 mg nightly. So far she is tolerating the nortriptyline well with the exception of some dry mouth and constipation which she is working to manage.  She does not feel substantially better better or different off the olanzapine.  No change in her sleep which is good.  Main concern currently in addition to the residual depression is anxiety which is somewhat situational with pending arch show.  She is worrying about it more than normal.  Says she is having to take lorazepam twice a day where she had been able to keep reduce it prior to this.  She still does not feel like herself with residual depression with less social interest and less of her usual buoyancy in personality.  She is flatter than normal.  Overall she still feels that the Spravato has been helpful at reducing the severity of the depression.  She is not suicidal. She has not heard anything about the [place] appeal as of yet.  09/05/2022 appointment noted: Received Spravato 84 mg today as scheduled.  Tolerated it well without nausea or vomiting headache or chest pain or palpitations.  Expected dissociation and feels less depressed with resolution of negative emotions immediately after Spravato and then depression, anxiety creep back in. Continues meds Adderall XR 20 mg every morning, Wellbutrin XL 450 every morning, clonidine 0.1 mg twice daily, lorazepam 1 mg every 6 hours as needed. Stopped olanzapine and started nortriptyline which she has taken for about 2 week is 75 mg nightly. Initially blood pressure was a little high causing delay in starting Spravato.  She admitted to feeling a little wound up.  She still experiences a little increase in depression if she goes longer than a week in between doses of Spravato.  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  09/10/22 appt noted: Received  Spravato 84 mg today as scheduled.  Tolerated it well without nausea or vomiting headache or chest pain or palpitations.  Expected dissociation gradually resolved over the 2 hour observation period. She feels 50% less depressed with Spravto and wants to continue it.   Continues meds Adderall XR 20 mg every morning, Wellbutrin XL 450 every morning, clonidine 0.1 mg twice daily, lorazepam 1 mg every 6 hours as needed. Has started nortriptyline 75 mg nightly for about 3 weeks. Has not seen a significant difference with the addition of nortriptyline.  Tolerating it pretty well. She continues  to have some degree of anhedonia and significant depression and anxiety.  Her daughters noticed that she is more needy and calls more frequently.  She acknowledges this as well.  She is clearly still not herself. Plan: pramipexole off label and RX 0.25 mg BID  09/17/2022 appointment noted: Received Spravato 84 mg today as scheduled.  Tolerated it well without nausea or vomiting headache or chest pain or palpitations.  Expected dissociation gradually resolved over the 2 hour observation period. She feels 50% less depressed with Spravto and wants to continue it.   Continues meds Adderall XR 20 mg every morning, Wellbutrin XL 450 every morning, clonidine 0.1 mg twice daily, lorazepam 1 mg every 6 hours as needed. Has started nortriptyline 75 mg nightly for about 3 weeks and DT level 176, reduced to 50 mg HS early November. Still the same sx as noted last visit.  Tolerating meds.   Compliant.  Still depressed and family notices.  Has been able to participate in family interactions.  Some post-show let down and has to do detailed work which is hard for her bc ADD.  Sleep and eating well.  Energy OK but not great.  No SI.  Not cried in a year or so.  Clearly less depressed and hopeless than before the Spravato.  09/24/22 appt noted: Received Spravato 84 mg today as scheduled.  Tolerated it well without nausea or vomiting  headache or chest pain or palpitations.  Expected dissociation gradually resolved over the 2 hour observation period. She feels 50% less depressed with Spravto and wants to continue it.   Continues meds Adderall XR 20 mg every morning, Wellbutrin XL 450 every morning, clonidine 0.1 mg twice daily, lorazepam 1 mg every 6 hours as needed. Has started nortriptyline 75 mg nightly for about 3 weeks and DT level 176, reduced to 50 mg HS early November. Still the same sx as noted last visit.  Tolerating meds.   Compliant.  Still depressed and family notices.  Has been able to participate in family interactions.  Some post-show let down and has to do detailed work which is hard for her bc ADD.  Sleep and eating well.  Energy OK but not great.  No SI.  Not cried in a year or so.  Clearly less depressed and hopeless than before the Spravato. Is not making further progress generally.  Stuck with moderate depression  10/02/22 appt noted: Received Spravato 84 mg today as scheduled.  Tolerated it well without nausea or vomiting headache or chest pain or palpitations.  Expected dissociation gradually resolved over the 2 hour observation period. She feels 50% less depressed with Spravto and wants to continue it.   Continues meds Adderall XR 20 mg every morning, Wellbutrin XL 450 every morning, clonidine 0.1 mg twice daily, lorazepam 1 mg every 6 hours as needed. Has started nortriptyline 75 mg nightly for about 3 weeks and DT level 176, reduced to 50 mg HS early November. Still the same sx as noted last visit.  Tolerating meds.   Compliant.  Still depressed and family notices.  Has been able to participate in family interactions.  Some post-show let down and has to do detailed work which is hard for her bc ADD.  Sleep and eating well.  Energy OK but not great.  No SI.  Not cried in a year or so.  Clearly less depressed and hopeless than before the Spravato. Is not making further progress generally.  Stuck with moderate  depression.  Is  able to function pretty normally. Plan: trial pramipexole 0.25 mg BID off label for depression.   10/08/22 appt noted: Received Spravato 84 mg today as scheduled.  Tolerated it well without nausea or vomiting headache or chest pain or palpitations.  Expected dissociation gradually resolved over the 2 hour observation period. She feels 50% less depressed with Spravto and wants to continue it.   Continues meds Adderall XR 20 mg every morning, Wellbutrin XL 450 every morning, clonidine 0.1 mg twice daily, lorazepam 1 mg every 6 hours as needed. Has started nortriptyline 75 mg nightly for about 3 weeks and DT level 176, reduced to 50 mg HS early November. Still the same sx as noted last visit.  Tolerating meds.   Compliant.  Still depressed and family notices.  Has been able to participate in family interactions.  Some post-show let down and has to do detailed work which is hard for her bc ADD.  Sleep and eating well.  Energy OK but not great.  No SI.  Not cried in a year or so.  Clearly less depressed and hopeless than before the Spravato. Is not making further progress generally.  Stuck with moderate depression.  Behaved and felt pretty normally with family over for Thanksgiving. Doesn't see benefit or SE with pramipexole but thinks maybe it makes her worse. Plan:  increase pramipexole augmentation off label to 0.5 mg BID  10/15/2022 appointment noted: Received Spravato 84 mg today as scheduled.  Tolerated it well without nausea or vomiting headache or chest pain or palpitations.  Expected dissociation gradually resolved over the 2 hour observation period. She feels 50% less depressed with Spravto and wants to continue it.   Continues meds Adderall XR 20 mg every morning, Wellbutrin XL 450 every morning, clonidine 0.2 mg twice daily, lorazepam 1 mg every 6 hours as needed.  Trazodone 50 mg tablets 1-2 nightly as needed insomnia Has started nortriptyline 75 mg nightly for about 3 weeks  and DT level 176, reduced to 50 mg HS early November. Recommended increase pramipexole 0.5 mg twice daily off label for treatment resistant depression on 10/08/2022. She feels better motivated more active with pramipexole 0.5 mg twice daily.  She still is depressed but it is better.  We discussed the possibility of going up in the dose but did not change it.  10/22/2022 appointment noted: Received Spravato 84 mg today as scheduled.  Tolerated it well without nausea or vomiting headache or chest pain or palpitations.  Expected dissociation gradually resolved over the 2 hour observation period. She feels 50% less depressed with Spravto and wants to continue it.   Continues meds Adderall XR 20 mg every morning, Wellbutrin XL 450 every morning, clonidine 0.2 mg twice daily, lorazepam 1 mg every 8 hours as needed.  Trazodone 50 mg tablets 1-2 nightly as needed insomnia Has started nortriptyline 75 mg nightly for about 3 weeks and DT level 176, reduced to 50 mg HS early November. pramipexole 0.5 mg twice daily off label for treatment resistant depression on 10/08/2022. She is better motivated than she was.  She is journaling 3 pages a day.  She has started walking and has walked 5 days a week for 50 minutes for the last 2 weeks.  That is significantly helped her mood.  Her mood tends to be better when she interacts with family.  However she still has some periods of depression.  She is tolerating the medications well.  She still feels like her affect and confidence is not  back to normal. Plan:  increase pramipexole augmentation off label to 0.5 mg tID or 0.75 mg BID   10/29/22 appt noted: Received Spravato 84 mg today as scheduled.  Tolerated it well without nausea or vomiting headache or chest pain or palpitations.  Expected dissociation gradually resolved over the 2 hour observation period. She feels 50% less depressed with Spravto and wants to continue it.   Continues meds Adderall XR 20 mg every  morning, Wellbutrin XL 450 every morning, clonidine 0.2 mg twice daily, lorazepam 1 mg every 8 hours as needed.  Trazodone 50 mg tablets 1-2 nightly as needed insomnia Has started nortriptyline 75 mg nightly for about 3 weeks and DT level 176, reduced to 50 mg HS early November. pramipexole increased to 0.75 mg twice daily off label for treatment resistant depression  She has been feeling somewhat better with the increase in pramipexole and is tolerating it well.  She still is easily overwhelmed.  Her affect and mood can improve now when around her family or doing something positive.  She has been able to be more productive. She is tolerating the current medications. She is still considering TMS as an alternative to Spravato.  11/15/2022 appointment noted: Received Spravato 84 mg today as scheduled.  Tolerated it well without nausea or vomiting headache or chest pain or palpitations.  Expected dissociation gradually resolved over the 2 hour observation period. She feels 50% less depressed with Spravto and wants to continue it.   Continues meds Adderall XR 20 mg every morning, Wellbutrin XL 450 every morning, clonidine 0.2 mg twice daily, lorazepam 1 mg every 8 hours as needed.  Trazodone 50 mg tablets 1-2 nightly as needed insomnia Has started nortriptyline 75 mg nightly for about 3 weeks and DT level 176, reduced to 50 mg HS early November. pramipexole increased to 0.75 mg twice daily off label for treatment resistant depression  He has noticed some increase in depression due to the length of time since the last Spravato administration.  She believes Spravato is helping her.  sHe is not suicidal but has felt very blue the last few days. She is tolerating the medication. She is ambivalent about Spravato versus [place] but she is considering [place]. She wants to continue Spravato administration because it is clearly helpful.  11/19/22 appt noted: Received Spravato 84 mg today as scheduled.  Tolerated it well  without nausea or vomiting headache or chest pain or palpitations.  Expected dissociation gradually resolved over the 2 hour observation period. She feels 50% less depressed with Spravto and wants to continue it.   Continues meds Adderall XR 20 mg every morning, Wellbutrin XL 450 every morning, clonidine 0.2 mg twice daily, lorazepam 1 mg every 8 hours as needed.  Trazodone 50 mg tablets 1-2 nightly as needed insomnia Has started nortriptyline 75 mg nightly for about 3 weeks and DT level 176, reduced to 50 mg HS early November. pramipexole increased to 0.75 mg twice daily off label for treatment resistant depression  She has felt better back on Spravato more regularly.  However she still has residual depression esp when alone or when wihtout activity.  Can function when needed.   Does not have her connfidence back. She plans to pursue [place] availability. Have discussed retrying [place]  11/28/22 appt noted: Received Spravato 84 mg today as scheduled.  Tolerated it well without nausea or vomiting headache or chest pain or palpitations.  Expected dissociation gradually resolved over the 2 hour observation period. She feels 50% less  depressed with Spravto and wants to continue it.   Continues meds Adderall XR 20 mg every morning, Wellbutrin XL 450 every morning, clonidine 0.2 mg twice daily, lorazepam 1 mg every 8 hours as needed.  Trazodone 50 mg tablets 1-2 nightly as needed insomnia Has started nortriptyline 75 mg nightly for about 3 weeks and DT level 176, reduced to 50 mg HS early November. pramipexole increased to 0.75 mg twice daily off label for treatment resistant depression  Last week she was more depressed than usual for reasons that are not clear.  She has not had a clear answer from Adrienne Mocha about [facility] options.  States that they have not returned her call.  She is asked about Auvelity retry in place of Wellbutrin.  Has still been walking.  12/03/22 appt noted: Received Spravato 84 mg today  as scheduled.  Tolerated it well without nausea or vomiting headache or chest pain or palpitations.  Expected dissociation gradually resolved over the 2 hour observation period. She feels 50% less depressed with Spravto and wants to continue it.   Continues meds Adderall XR 20 mg every morning, Wellbutrin XL 450 every morning, clonidine 0.2 mg twice daily, lorazepam 1 mg every 8 hours as needed.  Trazodone 50 mg tablets 1-2 nightly as needed insomnia started nortriptyline 75 mg nightly for about 3 weeks and DT level 176, reduced to 50 mg HS early November. pramipexole increased to 0.75 mg twice daily off label for treatment resistant depression  No SE .  Satisfied with meds. Depression is better in the last week.  Not sure why that is the case.  Still walking daily and that helps and journaling 3 pages daily.  Working on [employer], academic.  No changes desire.  Clearly benefits from Spravato but not 100%.  Still considering TMS.  12/10/22 appt noted: Received Spravato 84 mg today as scheduled.  Tolerated it well without nausea or vomiting headache or chest pain or palpitations.  Expected dissociation gradually resolved over the 2 hour observation period. She feels 50% less depressed with Spravto and wants to continue it.   Continues meds Adderall XR 20 mg every morning, Wellbutrin XL 450 every morning, clonidine 0.2 mg twice daily, lorazepam 1 mg every 8 hours as needed.  Trazodone 50 mg tablets 1-2 nightly as needed insomnia started nortriptyline 75 mg nightly for about 3 weeks and DT level 176, reduced to 50 mg HS early November. pramipexole increased to 0.75 mg twice daily off label for treatment resistant depression  No SE .   Depression was worse this week for no apparent reason.  She struggled being positive.  She has felt more discouraged.  She has felt more anxious.  She has had a hard time doing tasks.  She is interested in retrying the [place] which we had discussed previously. Plan: She agrees to  [facility].  To improve tolerability and reduce risk of side effects, Stop Wellbutrin and start Auvelity 1 in the morning for 1 week then 1 twice daily AndReduce pramipexole 0.5 mg BID and reduce nortriptyline to 50 mg HS  12/17/22 appt noted: Received Spravato 84 mg today as scheduled.  Tolerated it well without nausea or vomiting headache or chest pain or palpitations.  Expected dissociation gradually resolved over the 2 hour observation period. She feels 50% less depressed with Spravto and wants to continue it.   Continues meds Adderall XR 20 mg every morning, stopped Wellbutrin XL 150 every morning, clonidine 0.2 mg twice daily, lorazepam 1 mg every  8 hours as needed.  Trazodone 50 mg tablets 1-2 nightly as needed insomnia Has started nortriptyline 75 mg nightly for about 3 weeks and DT level 176, reduced to 50 mg HS early November. pramipexole 0.375 mg twice daily off label for treatment resistant depression with plan to stop Started Auvelity 1 AM Still depressed to moderate degree.  Tolerating Auvelity so far.  No other problems with meds.  Willing to give Auvelity a chance. Plan: She agrees to [facility].  Continue 1 AM and when tolerated then increase to BID Stop pramipexole.  12/26/22 received Spravato  01/01/23 appt noted: Received Spravato 84 mg today as scheduled.  Tolerated it well without nausea or vomiting headache or chest pain or palpitations.  Expected dissociation gradually resolved over the 2 hour observation period. She feels 50% less depressed with Spravto and wants to continue it.   Continues meds Adderall XR 20 mg every morning, stopped Wellbutrin XL 150 every morning, clonidine 0.2 mg twice daily, lorazepam 1 mg every 8 hours as needed.  Trazodone 50 mg tablets 1-2 nightly as needed insomnia Has started nortriptyline 75 mg nightly for about 3 weeks and DT level 176, reduced to 50 mg HS early November. Forgot to reduce pramipexole stoll taking  0.5 mg twice daily off  label for treatment resistant depression  Started Auvelity 1 AM & PM last week. Occ misses Spravato DT htn.this week a better than last.   Painting again more and it helps mood.  Confidence still low and not as likely to socialize as normal but enjoys family.   Still ambivalent about [place]. Tolerating meds.  Including the increase in [place].    01/08/23 appt noted: Received Spravato 84 mg today as scheduled.  Tolerated it well without nausea or vomiting headache or chest pain or palpitations.  Expected dissociation gradually resolved over the 2 hour observation period. She feels 50% less depressed with Spravto and wants to continue it.   Continues meds Adderall XR 20 mg every morning, stopped Wellbutrin XL 150 every morning, clonidine 0.2 mg twice daily, lorazepam 1 mg every 8 hours as needed.  Trazodone 50 mg tablets 1-2 nightly as needed insomnia Has started nortriptyline 75 mg nightly for about 3 weeks and DT level 176, reduced to 50 mg HS early November. reduced pramipexole to 0.5 mg daily off label for treatment resistant depression  Started Auvelity 1 AM & PM mid Feb. More depressed as week progresses.  Thinks it is worse with less pramipexole.  More negative.  Able to function but feels miserable.  No SI.  Hopeless.  Sleep ok.  Tolerating meds.   Talked to [facility] about [place] and appt mid March. Plan: increase pramipexole to 0.5 mg TID for dep bc seemed to worsen with the reduction.  01/15/23 appt noted: Received Spravato 84 mg today as scheduled.  Tolerated it well without nausea or vomiting headache or chest pain or palpitations.  Expected dissociation gradually resolved over the 2 hour observation period. She feels 50% less depressed with Spravto and wants to continue it.   Continues meds Adderall XR 20 mg every morning, stopped Wellbutrin XL 150 every morning, clonidine 0.2 mg twice daily, lorazepam 1 mg every 8 hours as needed.  Trazodone 50 mg tablets 1-2 nightly as needed  insomnia Has started nortriptyline 75 mg nightly for about 3 weeks and DT level 176, reduced to 50 mg HS early November. Increased pramipexole to 0.5 mg  to TID  off label for treatment resistant depression  Started Auvelity 1 AM & PM mid Feb. markedly better depression the increase in pramipexole.  She feels like her depression is almost resolved.  She is very pleased with the response.  She is tolerating the medications well  01/30/23 appt noted: Received Spravato 84 mg today as scheduled.  Tolerated it well without nausea or vomiting headache or chest pain or palpitations.  Expected dissociation gradually resolved over the 2 hour observation period. She feels 50% less depressed with Spravto and wants to continue it.   Continues meds Adderall XR 20 mg every morning, stopped Wellbutrin XL 150 every morning, clonidine 0.2 mg twice daily, lorazepam 1 mg every 8 hours as needed.  Trazodone 50 mg tablets 1-2 nightly as needed insomnia Has started nortriptyline 75 mg nightly for about 3 weeks and DT level 176, reduced to 50 mg HS early November. Increased pramipexole to 0.5 mg  to TID  off label for treatment resistant depression  Started Auvelity 1 AM & PM mid Feb. Mood markedly better with pramipexole added.  Nearly normal mood now with minimal depression.  More social and outgoing and motivated and resolved anhedonia. No SE.  Compliant.  02/04/23 appt noted: Continues meds Adderall XR 20 mg every morning, stopped Wellbutrin XL 150 every morning, clonidine 0.2 mg twice daily, lorazepam 1 mg every 8 hours as needed.  Trazodone 50 mg tablets 1-2 nightly as needed insomnia Has started nortriptyline 75 mg nightly for about 3 weeks and DT level 176, reduced to 50 mg HS early November. Increased pramipexole to 0.5 mg  to TID  off label for treatment resistant depression  Started Auvelity 1 AM & PM mid Feb. Received Spravato 84 mg today as scheduled.  Tolerated it well without nausea or vomiting headache  or chest pain or palpitations.  Expected dissociation gradually resolved over the 2 hour observation period. She feels 50% less depressed with Spravto and wants to continue it.  The pramipexole got her the rest of the way better.  Doesn't want to cut back on any tx bc afraid of relapse. Tolerating meds.  02/15/23 appt noted: Continues meds Adderall XR 20 mg every morning, stopped Wellbutrin XL 150 every morning, clonidine 0.2 mg twice daily, lorazepam 1 mg every 8 hours as needed.  Trazodone 50 mg tablets 1-2 nightly as needed insomnia Has started nortriptyline 75 mg nightly for about 3 weeks and DT level 176, reduced to 50 mg HS early November. Increased pramipexole to 0.5 mg  to TID  off label for treatment resistant depression  Started Auvelity 1 AM & PM mid Feb. Received Spravato 84 mg today as scheduled.  Tolerated it well without nausea or vomiting headache or chest pain or palpitations.  Expected dissociation gradually resolved over the 2 hour observation period. She feels no longer depressed.   The pramipexole got her the rest of the way better.  Doesn't want to cut back on any tx bc afraid of relapse. Tolerating meds.  02/18/23 appt noted: Continues meds Adderall XR 20 mg every morning, stopped Wellbutrin XL 150 every morning, clonidine 0.2 mg twice daily, lorazepam 1 mg every 8 hours as needed.  Trazodone 50 mg tablets 1-2 nightly as needed insomnia Has started nortriptyline 75 mg nightly for about 3 weeks and DT level 176, reduced to 50 mg HS early November. Increased pramipexole to 0.5 mg  to TID  off label for treatment resistant depression  Started Auvelity 1 AM & PM mid Feb. Received Spravato 84 mg today as scheduled.  Tolerated it well without nausea or vomiting headache or chest pain or palpitations.  Expected dissociation gradually resolved over the 2 hour observation period. She feels no longer depressed except when ran out of Auvelity this week DT lack of availability.  Much worse  without it..   The pramipexole got her the rest of the way better.  Doesn't want to cut back on any tx bc afraid of relapse. Tolerating meds.  02/25/23 appt: Received Spravato 84 mg today as scheduled.  Tolerated it well without nausea or vomiting headache or chest pain or palpitations.  Expected dissociation gradually resolved over the 2 hour observation period. Continues meds Adderall XR 20 mg every morning, stopped Wellbutrin XL 150 every morning, clonidine 0.2 mg twice daily, lorazepam 1 mg every 8 hours as needed.  Trazodone 50 mg tablets 1-2 nightly as needed insomnia Has started nortriptyline 75 mg nightly for about 3 weeks and DT level 176, reduced to 50 mg HS early November. Increased pramipexole to 0.5 mg  to TID  off label for treatment resistant depression  Started Auvelity 1 AM & PM mid Feb. No SE Still dramatically better with meds and Spravato.  Not 100% over depression. Had GS born last week and able to enjoy it now.  Is a little sleepy with pramipexole but manageable.  No change desired.  Sleep is ok.   ECT-MADRS    Flowsheet Row Clinical Support from 01/08/2023 in [facility] Clinical Support from 08/06/2022 in [facility] Clinical Support from 07/04/2022 in [facility] Clinical Support from 05/21/2022 in [facility] Office Visit from 03/02/2022 in [facility]  MADRS Total Score 46        Past Psychiatric Medication Trials: fluoxetine, duloxetine, Viibryd, Pristiq, sertraline, citalopram,  Trintellix anxious and SI Wellbutrin XL 450 Auvelity 1 dose nortriptyline 75 mg nightly for about 3 weeks and DT level 176, reduced to 50 mg HS NR. Pramipexole 1 mg NR Adderall, Adderall XR, Vyvanse, Ritalin, Strattera low dose NR Lorazepam Trazodone  Depakote,  lamotrigine cog complaints Lithium remotely Abilify 7.5  Vraylar  1.5 mg daily agitation and insomnia Rexulti insomnia Latuda 40 one dose, CO anxious and SI Seroquel XR 300 Olanzapine 10  At visit November 12, 2019. We discussed Patient developed an increasingly severe alcohol dependence problem since her last visit in January.  She went to [facility] and has had no alcohol since then except 1 day.  She never abused stimulants but they took her off the stimulants at [facility].  Her ADD was markedly worse.  The Wellbutrin did not help the ADD.   D history lamotrigine rash at 67 yo  Review of Systems:  Review of Systems  Constitutional:  Negative for fatigue.  Musculoskeletal:  Positive for back pain. Negative for arthralgias and joint swelling.       SP hip surgery October 2020  Neurological:  Negative for dizziness and tremors.  Psychiatric/Behavioral:  Positive for decreased concentration and dysphoric mood. Negative for agitation, behavioral problems, confusion, hallucinations, self-injury, sleep disturbance and suicidal ideas. The patient is nervous/anxious. The patient is not hyperactive.        Forgetful at times about med recommendations.  Send present for him The only [place] she notices a it states it is unbelievable him how how much are her 3 steps around freedom of the present fall and just in the past decade  Medications:  I have reviewed the patient's current medications.  Current Outpatient Medications  Medication Sig Dispense Refill   amLODipine (NORVASC) 2.5 MG tablet Take 2.5 mg by mouth daily.     amphetamine-dextroamphetamine (ADDERALL XR) 20 MG 24 hr capsule Take 1 capsule (20 mg total) by mouth every morning. 30 capsule 0   amphetamine-dextroamphetamine (ADDERALL XR) 20 MG 24 hr capsule Take 1 capsule (20 mg total) by mouth every morning. 30 capsule 0   buPROPion (WELLBUTRIN XL) 150 MG 24 hr tablet TAKE 3 TABLETS BY MOUTH DAILY (Patient taking differently: Take 150 mg by mouth daily.) 270 tablet 1   cloNIDine (CATAPRES) 0.2  MG tablet TAKE 1 TABLET BY MOUTH 2 TIMES DAILY. 180 tablet 0   Dextromethorphan-buPROPion ER (AUVELITY) 45-105 MG TBCR Take 1 tablet by mouth 2 (two) times daily. 60 tablet 3   Esketamine HCl, 84 MG Dose, (SPRAVATO, 84 MG DOSE,) 28 MG/DEVICE SOPK USE 3 SPRAYS IN EACH NOSTRIL ONCE EVERY WEEK 3 each 1   iron polysaccharides (NIFEREX) 150 MG capsule TAKE 1 CAPSULE BY MOUTH EVERY DAY 90 capsule 1   LORazepam (ATIVAN) 1 MG tablet Take 1 tablet (1 mg total) by mouth every 8 (eight) hours as needed. for anxiety 90 tablet 1   losartan (COZAAR) 50 MG tablet Take 50 mg by mouth daily.     nebivolol (BYSTOLIC) 2.5 MG tablet Take 2.5 mg by mouth daily.     nortriptyline (PAMELOR) 25 MG capsule Take 2 capsules (50 mg total) by mouth at bedtime. 180 capsule 0   pramipexole (MIRAPEX) 0.5 MG tablet Take 1 tablet (0.5 mg total) by mouth 3 (three) times daily. 90 tablet 1   traZODone (DESYREL) 50 MG tablet TAKE 1-2 TABLETS BY MOUTH NIGHTLY AS NEEDED FOR SLEEP 180 tablet 1   No current facility-administered medications for this visit.    Medication Side Effects: None  Allergies:  Allergies  Allergen Reactions   Metronidazole Shortness Of Breath and Other (See Comments)    Heart pounding   Ferrlecit [Na Ferric Gluc Cplx In Sucrose] Other (See Comments)    Infusion reaction 05/12/2019    Past Medical History:  Diagnosis Date   ADHD    Anemia    Anxiety    Arthritis    Depression    Heart murmur    i went to see a cardiologit slast eyar  and i had zero plaque,    PONV (postoperative nausea and vomiting)    Recovering alcoholic in remission     Family History  Problem Relation Age of Onset   Atrial fibrillation Mother    CAD Father     Past Medical History, Surgical history, Social history, and Family history were reviewed and updated as appropriate.   Please see review of systems for further details on the patient's review from today.   Objective:   Physical Exam:  There were no vitals  taken for this visit.  Physical Exam Constitutional:      General: She is not in acute distress. Neurological:     Mental Status: She is alert and oriented to person, place, and time.     Coordination: Coordination normal.     Gait: Gait normal.  Psychiatric:        Attention and Perception: Attention and perception normal.        Mood and Affect: Mood is anxious and depressed. Affect is not tearful.        Speech: Speech is not rapid and pressured.  Behavior: Behavior is not slowed.        Thought Content: Thought content is not paranoid or delusional. Thought content does not include homicidal or suicidal ideation. Thought content does not include suicidal plan.        Cognition and Memory: Cognition normal. Memory is not impaired. She does not exhibit impaired recent memory.        Judgment: Judgment normal.     Comments: Insight intact. No auditory or visual hallucinations. No delusions.  Depression nearly resolved with pramipexole TID and relapsed when out of Auvelity.     Lab Review:     Component Value Date/Time   NA 137 01/12/2021 1430   NA 140 11/18/2018 1544   K 3.8 01/12/2021 1430   CL 108 01/12/2021 1430   CO2 22 01/12/2021 1430   GLUCOSE 94 01/12/2021 1430   BUN 14 01/12/2021 1430   BUN 20 11/18/2018 1544   CREATININE 0.82 01/12/2021 1430   CALCIUM 8.9 01/12/2021 1430   PROT 6.6 01/12/2021 1430   ALBUMIN 3.9 01/12/2021 1430   AST 12 (L) 01/12/2021 1430   ALT 11 01/12/2021 1430   ALKPHOS 46 01/12/2021 1430   BILITOT 0.5 01/12/2021 1430   GFRNONAA >60 01/12/2021 1430   GFRAA >60 09/02/2019 0249   GFRAA >60 01/27/2019 0811       Component Value Date/Time   WBC 4.5 01/12/2021 1430   RBC 4.32 01/12/2021 1430   HGB 12.8 01/12/2021 1430   HGB 12.9 07/17/2019 0953   HCT 38.5 01/12/2021 1430   HCT 21.9 (L) 12/25/2018 1221   PLT 272 01/12/2021 1430   PLT 286 07/17/2019 0953   MCV 89.1 01/12/2021 1430   MCH 29.6 01/12/2021 1430   MCHC 33.2 01/12/2021  1430   RDW 12.4 01/12/2021 1430   LYMPHSABS 1.4 01/12/2021 1430   MONOABS 0.4 01/12/2021 1430   EOSABS 0.0 01/12/2021 1430   BASOSABS 0.0 01/12/2021 1430    No results found for: "POCLITH", "LITHIUM"   No results found for: "PHENYTOIN", "PHENOBARB", "VALPROATE", "CBMZ"   .res Assessment: Plan:    Recurrent major depression resistant to treatment  Generalized anxiety disorder  Attention deficit hyperactivity disorder (ADHD), predominantly inattentive type  Insomnia due to mental condition  Accelerated hypertension   She has treatment resistant major depression ongoing with 50% better with Spravato. Have  discussed some of her  abnormal behaviors last year leading to this depressive episode getting worse which she says were associated with heavy use of delta 8 and not a manic episode.  She realizes now that that was not good for her.  She stopped all use of other drugs including those available over-the-counter such as delta 8 or any other THC related products.  She is no longer having any of those types of behaviors and instead is depressed.  She is experiencing more pleasure and is less blunted and enjoying more and better inteest versus before the Spravato.  She also feels worse if misses a dose of Spravato.  Depression resolved recently after increasing pramipexole 0.5 mg TID, brief relapse when out of Auvelity Consider switch to ER to reduce sleepiness  Patient was administered Spravato 84 mg intranasally today.  The patient experienced the typical dissociation which gradually resolved over the 2-hour period of observation.  There were no complications.  Specifically the patient did not have nausea or vomiting or headache.  Blood pressures monitored at the 40-minute and 2-hour follow-up intervals.  Borderline high.  By the time the 2-hour  observation period was met the patient was alert and oriented and able to exit without assistance.  Patient feels the Spravato administration is  helpful for the treatment resistant depression and would like to continue the treatment.  See nursing note for further details.She wants to continue Spravato. We discussed discussed the side effects in detail as well as the protocol required to receive Spravato.   Failed multiple antidepressants.  Many of them were not actual failures but intolerances and it is unclear whether some of that was more connected with anxiety than true side effects.  1 example is the [place].  In general she does not want to try anything but an antidepressant but has failed all major categories of antidepressants except TCAs and MAO inhibitors which have not been tried until now.  Started nortriptyline 75 mg nightly.  Serum level 176.  So Reduced to 50 mg HS. Consider stopping it.  Discussed side effects in detail.  Needs more time to help. Extensive discussion previously about her ambivalence about meds and missatributing sx of depression as SE of meds.    She is tolerating Auvelity.  Continue 1  BID bc more depressed without it.  Worse with less pramipexole. Increased to 0.5 mg TID on about end of Feb 2024 and markedly better Dosing range off label for depression ranges from 1-5 mg daily.  Disc risk compulsive impulsive behavior and manis.    Started Spravato 84 mg twice weekly on 03/16/2022.  Now on weekly administration  Adderall  XR 20 mg AM   Ativan 1 mg 3 times daily as needed anxiety but try to cut it back. Is not ideal to use benzodiazepine with stimulant but because of the severity of her symptoms it has been necessary.  Hope to eventually eliminate the benzodiazepine.  Expected as her depression improves her anxiety will improve as well.  However lately her anxiety has been unmanageable.  We will expect that to improve as the depression improves.  She has headed insturctions to reduce this.  Continue clonidine 0.2 mg BID off label for anxiety and helps BP partially. BP is better controlled but not consistent.   Consider increasing amlodipine.  Also on losartan 50  Discussed potential benefits, risks, and side effects of stimulants with patient to include increased heart rate, palpitations, insomnia, increased anxiety, increased irritability, or decreased appetite.  Instructed patient to contact office if experiencing any significant tolerability issues. She wants to return to usual dose of Adderall for ADD bc of mor poor cognitive function with reduction.  Also discussed that depression will impair cognitive function.  Disc risk polypharmacy.  Reevaluate meds after better with pramipexole.  Relapsed after running out of Auvelity so needs clearly it and pramipexole.  Give it more time.  Rec keep track of BP and discuss with PCP. It has been up and down.  Sometimes needs extra clonidine before Spravato  Has Maintained sobriety  FU with Spravato weekly   Meredith Staggers, MD, DFAPA     Please see After Visit Summary for patient specific instructions.  No future appointments.                 No orders of the defined types were placed in this encounter.      -------------------------------

## 2023-02-26 ENCOUNTER — Other Ambulatory Visit: Payer: Self-pay | Admitting: Psychiatry

## 2023-02-26 MED ORDER — AMPHETAMINE-DEXTROAMPHET ER 20 MG PO CP24
20.0000 mg | ORAL_CAPSULE | ORAL | 0 refills | Status: DC
Start: 2023-02-26 — End: 2023-05-07

## 2023-02-26 MED ORDER — AMPHETAMINE-DEXTROAMPHET ER 20 MG PO CP24
20.0000 mg | ORAL_CAPSULE | ORAL | 0 refills | Status: DC
Start: 2023-03-26 — End: 2023-04-26

## 2023-02-26 NOTE — Progress Notes (Signed)
NURSES NOTE:   Patient arrived for her weekly  Spravato treatment. Pt is being treated for Treatment Resistant Depression this is #56 treatment, pt will be receiving 84 mg which will continue to be her maintenance dose, she receives weekly treatments.  Patient arrived and taken to treatment room. Confirmed she had a ride home which is her husband would be coming back to pick up pt when done and sometimes she needs to use [facility] if he is unable to pick her up. Pt's Spravato is ordered through [employer] and delivered to office, all Spravato medication is stored at doctors office per REMS/FDA guidelines. The medication is required to be locked behind two doors per FDA/REMS Protocol. Medication is also disposed of properly per regulations. Pt's prior authorization for Spravato 84 mg was renewed with an approval through 07/17/2023 with [facility]. All treatments with vital signs documented with Spravato REMS per protocol of being a treatment center.    Began taking patient's vital signs at 1:40 PM 138/100, pulse 70. Pulse Ox 99%. Gave patient first dose 28 mg nasal spray, each nasal spray administered in each nostril as directed and waited 5 minutes between the second and third dose. All 3 doses given pt did not complain of any nausea/vomiting, given a cup of water due to the taste after the administration of Spravato.  She listens to [employer] with spa or relaxing music.  Checked 40 minute vitals at 2:20 PM, 94/63, pulse 71, Pulse Ox 92%. Explained she would be monitored for a total time of 120 minutes. Discharge vitals were taken at 3:40 PM 97/61 P 60, 99% Pulse Ox. Dr. Jennelle Human met with pt today and discussed her medication. I walked pt to elevator, where her husband met her for the ride home. Recommend she go home and sleep or just relax on the couch. No driving, no intense activities. Verbalized understanding. Nurse was with pt a total of 70 minutes for clinical. Pt will be scheduled next Monday, April  22nd. Pt instructed to call office with any problems or questions.      LOT [id number]

## 2023-02-26 NOTE — Addendum Note (Signed)
Addended by: Kirstie Peri on: 02/26/2023 01:50 PM   Modules accepted: Orders

## 2023-03-04 ENCOUNTER — Encounter: Payer: Self-pay | Admitting: Psychiatry

## 2023-03-04 ENCOUNTER — Ambulatory Visit (INDEPENDENT_AMBULATORY_CARE_PROVIDER_SITE_OTHER): Payer: 59 | Admitting: Psychiatry

## 2023-03-04 ENCOUNTER — Ambulatory Visit: Payer: 59

## 2023-03-04 VITALS — BP 92/60 | HR 53

## 2023-03-04 DIAGNOSIS — F900 Attention-deficit hyperactivity disorder, predominantly inattentive type: Secondary | ICD-10-CM

## 2023-03-04 DIAGNOSIS — F339 Major depressive disorder, recurrent, unspecified: Secondary | ICD-10-CM | POA: Diagnosis not present

## 2023-03-04 DIAGNOSIS — I10 Essential (primary) hypertension: Secondary | ICD-10-CM

## 2023-03-04 DIAGNOSIS — F5105 Insomnia due to other mental disorder: Secondary | ICD-10-CM

## 2023-03-04 DIAGNOSIS — F411 Generalized anxiety disorder: Secondary | ICD-10-CM

## 2023-03-04 NOTE — Progress Notes (Signed)
NURSES NOTE:   Patient arrived for her weekly  Spravato treatment. Pt is being treated for Treatment Resistant Depression this is #57 treatment, pt will be receiving 84 mg which will continue to be her maintenance dose, she receives weekly treatments.  Patient arrived and taken to treatment room. Confirmed she had a ride home which is her husband would be coming back to pick up pt when done and sometimes she needs to use [facility] if he is unable to pick her up. Pt's Spravato is ordered through [employer] and delivered to office, all Spravato medication is stored at doctors office per REMS/FDA guidelines. The medication is required to be locked behind two doors per FDA/REMS Protocol. Medication is also disposed of properly per regulations. Pt's prior authorization for Spravato 84 mg was renewed with an approval through 07/17/2023 with [facility]. All treatments with vital signs documented with Spravato REMS per protocol of being a treatment center.    Began taking patient's vital signs at 1:15 PM 124/80, pulse 65. Pulse Ox 99%. Gave patient first dose 28 mg nasal spray, each nasal spray administered in each nostril as directed and waited 5 minutes between the second and third dose. All 3 doses given pt did not complain of any nausea/vomiting, given a cup of water due to the taste after the administration of Spravato.  She listens to [employer] with spa or relaxing music.  Checked 40 minute vitals at 2:00 PM, 93/73, pulse 65, Pulse Ox 92%. Explained she would be monitored for a total time of 120 minutes, but pt was very drowsy and sedate today so I let her stay in the recliner another 30-40 minutes before she left with her ride. Discharge vitals were taken at 3:10 PM 90/60 P 53, 99% Pulse Ox. Dr. Jennelle Human met with pt today and discussed her medication. I walked pt to elevator, where her husband met her for the ride home. Recommend she go home and sleep or just relax on the couch. No driving, no intense  activities. Verbalized understanding. Nurse was with pt a total of 70 minutes for clinical. Pt will be scheduled next Monday, April 29 th. Pt instructed to call office with any problems or questions.      LOT [id number] EXP OCT 2026

## 2023-03-04 NOTE — Progress Notes (Signed)
Laura Chang [id number] 16-Feb-1956 67 y.o.  Subjective:   Patient ID:  Laura Chang is a 67 y.o. (DOB [birth year]) female.  Chief Complaint:  Chief Complaint  Patient presents with   Follow-up   Depression   Anxiety   ADD   Fatigue      Laura Chang presents to the office today for follow-up of depression and anxiety and ADD.  seen November 12, 2019.  Melted down in 2020.  Went to [facility] in July.  No withdrawal.  1 drink since then.  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 She was on no stimulant and no SSRI but was taking Strattera and Wellbutrin.  The following changes were made. Stop Strattera. OK restart stimulant bc severe ADD Restart Adderall 1 daily for a few days and if tolerated then restart 1 twice daily. If not tolerated reduce the dosage if needed. May need to stop Wellbutrin if not tolerating the stimulant.  Yes.  DC Wellbutrin Restart Prozac 20 mg daily.  February 2021 appointment with the following noted: Completed grant proposal.  Couldn't doit without Adderall.  Sold a bunch of work.   Adderall XR lasts about 3 pm.  Strength seems about right.  BP been OK.  Not jittery.   Stopped Wellbutrin but had no SE. Mood drastically better with grant proposal and back on fluoxetine.  Less depressed and lethargic.  No anxiety.  Cut back on coffee. Started back with devotions and stronger faith. Plan: Continue Prozac 20 mg daily. May have to increase the dose at some point in the future given that she usually was taking higher dosages but she is getting good response at this time. Restart Wellbutrin off label for ADD since can't get 2 ADDERALL daily. 150 mg daily then 300 mg daily. She can adjust the dose between 150 mg and 300 mg daily to get the optimal effect.   05/11/2020 appointment with the following noted: Has been inconsistent with Prozac and Wellbutrin. Not sure of the effect of Wellbutrin. Biggest deterrent in work is  anxiety.  Some of the work is conceptual and difficult at times.  Can feel she's not up to a project at times.  Overall is OK but would like a steadier benefit from stimulant.  Exhausted from managing concentration and keeping up with things from the day.  Loses things.  Not good keeping up with schedule. Overall productive and emotionally OK. Can feel Adderall wear off. Mood is better in summer and worse in the winter.   F died in 11/01/2023 and that is a loss. No SE Wellbutrin. Still attends AA meetings.  Real benefit from [facility] last year. Recognizes effect of anemia on ADD and mood.  Had iron infusions last winter. Plan:  Wellbutrin off label for ADD since can't get 2 ADDERALL daily. 150 mg daily then 300 mg daily.  01/24/2021 appointment with following noted: Doing a program called [employer] mindfulness app since Xmas.  CBT app helped the depression.  App helped her focus better.  Lost sign weight. Writing a lot. Before Xmas felt depressed and started negative thinking worse, self denigrating. Not drinking. More isolated.   Recognizes mo is narcissist.    Didn't tell anyone she was born until 3 mos later.  M aloof and uninterested in pt.  Lied about her birthday.  Mo lack of affection even with pt's kids. Going to AA for a year and it helped her to quit drinking. Also misses  kids being gone with a hole also. Plan: No med changes  05/04/2021 appointment with the following noted: Therapist Paula Pile thinks she's manic. Lost weight to 144#.   States she is still sleeping okay.  Admits she is hyper and recognizes that she is likely manic.  She feels great, euphoric with an increased sense of spiritual connectedness to God.  She has racing thoughts and talks fast and talks a lot and this is noted by her husband.  He thinks she is a bit hyper.  She has been able to maintain sobriety although she will have 1 glass of wine on special occasions but does not drink by herself.  She is not  drinking to excess.  She denies any dangerous impulsivity.  She is clearly not depressed and not particularly anxious.  She has no concerns about her medication and she has been compliant.  06/16/21 appt noted: So much better.  Going through a lot but the manic thing happened on top of it.  So much slower.  Didn't feel like losing anything with risperidone.  Likes the Adderalll at 10 mg. Some drowsiness in the AM and very drowsy from risperidone 2 mg HS. Prayer life is better. Handling stress better. Less depressed with risperidone. Still likes trazodone. Sleeps well. Plan: Reduce Prozac to 10 mg daily.  Consider stopping it because it can feel the mania however she is reluctant to do that because she fears relapse of depression. Reduce risperidone to 1.5 mg nightly due to side effects.  Discussed risk of worsening mania.  07/25/2021 appointment with the following noted: Misses the Adderall and hard to function without it. Depressed now. Heavy chest.  Anxious and guilty.  Body feels heavy.   Hates Wellbutrin.   Plan: Increase fluoxetine to 20 mg daily Add Abilify 1/2 of 15 mg tablet daily Wean wellbutrin by 1 tablet each week  bc she feels it is not helpful and DT polypharmacy Reduce risperidone to 1 daily for 1 week and stop it. Disc risk of mania. Increase Adderall to XR 20 mg AM  08/08/21 Much less depressed and starting to feel normal I feel a lot better. No SE.  Speech normal off risperidone. Sleeping OK on trazaodone and enough.   Noticed benefit from Adderall again. Plan: continue fluoxetine to 20 mg daily Continue Abilify 1/2 of 15 mg tablet daily for depression and mania continue Adderall to XR 20 mg AM  10/10/2021 phone call: Pt stated she feels like the Abilify should be decreased to 5mg.She said she is depressed but rational and not suicidal.She has an appt Monday and can wait until then if you prefer. MD response: Reduce the Abilify to 7.5 mg every other day.  We will meet on  10/16/2021 and decide what to do from there.  10/16/2021 appointment with the following noted: More depressed.  Most depressed I've ever been.  Just numb.  Sense of grief.   Thinks the manic episode was unlike anything else she ever had.  Doesn't want to medicate against it.  Don't enjoy people.  Easily overwhelmed.  Had some death thoughts but not suicidal.  Has been functional.  Feels better today after reducing Abilify to every other day but she is only been doing that for 3 days. A/P: Episode of post manic depression was explained. continue fluoxetine to 20 mg daily Hold Abilify for 1 week then resume Abilify 1/2 of 15 mg tablet every other day for depression and mania continue Adderall to XR 20 mg   AM  10/27/2021 appointment with the following noted: I'm doing so much better.  Handling the depression better. Better self talk and spiritual focus has helped.   Dep 6/10 manifesting as anxiety with low confidence.   F died 2  years ago and M 67 yo and is dependent . She is working hard to feel better but still feels depressed.  She almost feels like she has a little more anxiety since restarting Abilify every other day. Plan: continue fluoxetine to 20 mg daily DC Abilify .  Vrayalar 1.5 mg QOD to try to get rid of depression ASAP. continue Adderall to XR 20 mg AM  11/10/2021 appointment with the following noted: Busy with Xmas and it was fun with family but then a big let down.  Did well with it.  Functioned well with it.  Working hard on things with depression.  Not shutting down. Not sure but feels better today but yesterday was hard.  Difficulty dealing with mother.  She won't do anything to help herself.  Yesterday with her all day.  Won't do PT and has isolated herself.    Lack of confidence.   No SE with Vraylar.  11/24/21 urgent appointment appt noted: More and more depressed.   So anxious and doesn't want to be alone but can do so. No appetite. Hurts inside. Has had some fleeting  suicidal thoughts but would not act on them.  Tolerating meds. Has been consistent with Vraylar 1.5 mg every other day, fluoxetine 20 mg daily Plan: Increase Vraylar to 1.5 mg daily Change Prozac to Trintellix 10 mg daily. Discussed side effects of each continue Adderall to XR 20 mg AM  12/27/2021 appointment with the following noted: Not OK.  I feel less depressed but feels bat shit. Not sleeping well.  Extremely anxious. Off and on sleep. 3-4 hours of sleep.   Still having daily SI.  But also become obvious has so much to do.  Overwhelmed by tasks.   Needs anxiety meds to just function. Not more motivated.  Walked yesterday.   Feels afraid like in trouble but not irritable or angry. DC DT agitation Vraylar to 1.5 mg daily Change Prozac to Trintellix 10 mg daily. Hold Adderall to XR 20 mg AM Clonidine 0.1 1/2 tablet twice daily for 2 days and if needed for anxiety and sleep increase to 1 twice daily Ok temporary Ativan 1 mg 3 times daily as needed anxiety  01/05/22 appt noted: Off fluoxetine and  Trintellix.  Only on Ativan, trazodone and Adderall XR 20 plus added clonidine 0.1 mg BID Didn't think she needed to start Trintellix. Not taking Ativan.   Didn't like herself last week. Feels some better today. Wonders if the manic sx Not agitated.  Anxiety kind of calmed down.  A lot to be anxious about situationally.  $ stress. Concerns about downers with meds. Can't access normal personality. ? Lethargy and inability to talk as sE. Plan: Latuda 20-40 mg daily with food. Adderall to XR 20 mg AM Clonidine 0.1 1/2 tablet twice daily  reduce dose to be sure no SE Ok temporary Ativan 1 mg 3 times daily as needed anxiety  01/19/22 appt noted: Taking Latuda 20 mg daily.  Took 40 mg once and felt anxious and  SI Still depressed and not very reactive Anxiety mainly about the depression and fears of the future. She wants to revisit manic sx and thinks it was maybe bc taking delta 8 bc was  taking a lot of   it so still doesn't think she's classic bipolar. She wants to only take Prozac bc thinks Latuda is perpetuating depression. Says the delta 8 was very psychaedelic.  When not taking it was not manic.  Sleeping ok again.  Plan: Per her request DC Latuda 20-40 mg daily with food. She wants to continue Prozac alone AMA  Adderall to XR 20 mg AM Clonidine 0.1 1/2 tablet twice daily  reduce dose to be sure no SE Ok temporary Ativan 1 mg 3 times daily as needed anxiety  01/23/2022 phone call complaining of increased anxiety since stopping Latuda.  She will try increasing clonidine.  01/26/2022 phone call not feeling well and wanted to restart the Vraylar.  However notes indicate that had made her agitated therefore she was encouraged to pick up samples of Rexulti 1 mg and start that instead.  02/06/2022 phone call: Stating she felt the Rexulti was helping with depression but she was not sleeping well and obsessing over things.  She was encouraged to increase Rexulti to 2 mg daily and increase trazodone for sleep.  02/09/2022 appointment with the following noted: This was an urgent work in appointment No sleep last night with trazodone 100 mg HS Nothing really better depression or anxiety. Ruminating negative anxious thoughts. Did not tolerate Rexulti because it was causing insomnia.  Does not think it helped depression.  Lacks emotion that she should have.  Lacks her usual personality.  Some hopeless thoughts.  Some death thoughts.  Some suicidal thoughts without plan or intent Plan: DC Rexulti and Prozac & DC trazodone Adderall to XR 20 mg AM Clonidine 0.1 1/tablet twice daily  reduce dose to be sure no SE Ok temporary Ativan 1 mg 3 times daily as needed anxiety Start Seroquel XR 150 mg nightly  03/02/2022 appointment: Angelique Blonder called back a few days after starting Seroquel stating it was making her more anxious and more depressed.  This seemed unlikely as this medicine rarely ever  causes anxiety.  She stopped the medication waited 3 days and called back still had anxiety and depression but thought perhaps the anxiety was a little better.  She did not want to take the Seroquel. She knew about the option of Spravato and wanted to pursue that. Now questions whether to return to Seroquel while waiting to start Spravato bc feels just as bad without it and knows she didn't give it enough time to work.   MADRS 46  ECT-MADRS    Flowsheet Row Clinical Support from 01/08/2023 in [facility] Clinical Support from 08/06/2022 in [facility] Clinical Support from 07/04/2022 in [facility] Clinical Support from 05/21/2022 in [facility] Office Visit from 03/02/2022 in [facility]  MADRS Total Score 21 29 15 27  46      03/14/22 appt noted: Pt received Spravato 56 mg first dose today with some dissociative sx which were not severe.  She was anxious prior to the administration and felt better after receiving lorazepam 1 mg.  No NV, or HA. Wants to continue Spravato. Ongoing depression and desperate to feel better.  I'm not myself DT deprsssion which is most severe in recent history.  Anhedonia.  Low motivation.  Social avoidance. Continues to think all recent med trials are making her worse.  Sleep ok with Seroquel.  03/16/22 appt noted: Received Spravato 84 mg for the first time.  some dissociative sx which were not severe.  She was  anxious prior to the administration and felt better after receiving lorazepam 1 mg.  No NV, or HA. Wants to continue Spravato.   Does not feel any better or different since the last appt.  Ongoing depression.  Ongoing depression and desperate to feel better.  I'm not myself DT deprsssion which is most severe in recent history.  Anhedonia.  Low motivation.  Social avoidance. Continues to think all recent med trials are making  her worse.  Sleep ok with Seroquel.  Does not want to continue Seroquel for TRD.  03/20/2022 appointment noted: Came for Spravato administration today.  However blood pressure was significantly elevated approximately 180/115.  She was given lorazepam 1 mg and clonidine 0.2 mg to try to get it down. She states she regretted stopping the Seroquel XR 300 mg tablets.  She now realizes it was helpful.  She did not sleep much at all last night.  She did not take the Adderall this morning. 2 to 3 hours after arrival blood pressure was still elevated at  170/110, 62 pulse.  For Spravato administration was canceled for today.  She admits to being anxious and depressed.  She is not suicidal.  She is highly motivated to receive the Spravato.  We discussed getting it tomorrow.  03/22/2022 appointment noted: Patient's blood pressure was never stable enough yesterday in order to get her in for Spravato administration.  She was encouraged to see her primary care doctor.  It is better today.  03/26/2022 appointment with the following noted: Blood pressure was better.  Saw her primary care doctor who started on oral Bystolic 2.5 mg daily. Received Spravato 84 mg today as scheduled.  Tolerated it well without nausea or vomiting headache or chest pain or palpitations.  Her blood pressure was borderline but manageable. She remains depressed and anxious.  She is ambivalent about the medicine and desperate to get to feel better.  Continues to have anhedonia and low energy and low motivation and reduced ability to do things.  Less social.  Not suicidal.  03/28/22 appt noted: Received Spravato 84 mg today as scheduled.  Tolerated it well without nausea or vomiting headache or chest pain or palpitations.  Her blood pressure was borderline but manageable. Has not seen any improvement so far.  Tolerating Seroquel.  Inconsistent with Bystolic and BP has been borderline high. Still depressed and anxious and anhedonia.  Low  motivation, energy, productivity. Taking quetiapine and tolerating XR 300 mg nightly.  04/04/22 appt noted: Received Spravato 84 mg today as scheduled.  Tolerated it well without nausea or vomiting headache or chest pain or palpitations.  Her blood pressure was borderline but manageable. Has not seen any improvement so far.  Tolerating Seroquel.   She still tends to think that the medications are making her worse.  She has said this about each of the recent psychiatric medicines including Seroquel.  However her husband thinks she is improved.  She also admits there is some improvement in productivity.  She still feels highly anxious.  She still does not enjoy things as normal.  She still feels desperate to improve as soon as possible. Has been taking Seroquel XR since 03/20/2022  04/10/22 appt noted: Received Spravato 84 mg today as scheduled.  Tolerated it well without nausea or vomiting headache or chest pain or palpitations.  Her blood pressure was borderline but manageable. Has not seen any improvement so far.  Tolerating Seroquel.  Doesn't like Seroquel bc she thinks it flattens here. Ongoing depression without  confidence Plan: Start Auvelity 1 every morning for persistent treatment resistant depression  04/12/2022 appointment with the following noted: Received Spravato 84 mg today as scheduled.  Tolerated it well without nausea or vomiting headache or chest pain or palpitations.  Her blood pressure was borderline but manageable. Has not seen any improvement so far.  Tolerating Seroquel.  Doesn't like Seroquel bc she thinks it flattens her. Received Spravato 84 mg today as scheduled.  Tolerated it well without nausea or vomiting headache or chest pain or palpitations.  Her blood pressure was borderline but manageable. Has not seen any improvement so far.  Tolerating Seroquel.  Doesn't like Seroquel bc she thinks it flattens here.  We discussed her ambivalence about it. She is starting Auvelity  and has tolerated it the last 2 days without side effect.  She still does not feel like herself and feels flat and not enjoying things with suppressed expressed emotion  04/17/2022 appointment with the following noted: Received Spravato 84 mg today as scheduled.  Tolerated it well without nausea or vomiting headache or chest pain or palpitations.  Her blood pressure was borderline but manageable. Has not seen any improvement so far.  Tolerating Seroquel.  Doesn't like Seroquel bc she thinks it flattens her. She has been tolerating the Auvelity 1 in the morning without side effects for about a week.  She has not noticed significant improvement so far.  She still feels depressed and flat and not herself.  Other people notice that she is flat emotionally.  She is not suicidal.  She does feel discouraged that she is not getting better yet.  04/19/2022 appointment noted: Has increased Auvelity to 1 twice daily for 2 days, continues quetiapine XR 300 mg nightly, clonidine 0.3 mg twice daily, lorazepam 1 mg twice daily for anxiety and Adderall XR 20 mg in the morning. No obious SE but she still thinks quetiapine XR is making her feel down.  But not sedated Received Spravato 84 mg today as scheduled.  Tolerated it well without nausea or vomiting headache or chest pain or palpitations.  Her blood pressure was borderline but manageable. She still feels quite anxious and feels it necessary to take both the clonidine and lorazepam twice a day to manage her anxiety.  She has been consistently down and flat and not herself until yesterday afternoon she noted an improvement in mood and feeling much more like herself with her normal personality reemerging.  She was quite depressed in the morning with very dark negative thoughts.  She did not have those dark negative thoughts this morning.  She had a lot of questions about medication and when she was expecting to be improved and why she has not shown improvement up to  now.  04/23/22 appt noted: Has increased Auvelity to 1 twice daily for 1 week, continues quetiapine XR 300 mg nightly, clonidine 0.3 mg twice daily, lorazepam 1 mg twice daily for anxiety and Adderall XR 20 mg in the morning. No obious SE but she still thinks quetiapine XR is making her feel down.  But not sedated Received Spravato 84 mg today as scheduled.  Tolerated it well without nausea or vomiting headache or chest pain or palpitations.  She is still depressed but admits better function and is able to enjoy social interactions. Tolerating meds.  Would like to feel better for sure. Not herself.  Flat. Plan increase Auvelity to 1 tab BID as planned and reduce Quetiapine to 1/2 of ER 300 mg  bc NR  for depression.  04/25/2022 appointment with the following noted: clonidine 0.3 mg twice daily, lorazepam 1 mg twice daily for anxiety and Adderall XR 20 mg in the morning. Seroquel XR 300 HS No obious SE but she still thinks quetiapine XR is making her feel down.  But not sedated Received Spravato 84 mg today as scheduled.  Tolerated it well without nausea or vomiting headache or chest pain or palpitations.  Called yesterday with more anxiety.  Had increased Auvelity for 1 day and reduced Seroquel XR for 1 day.  Felt restless and fearful  05/01/2022 appointment noted: clonidine 0.3 mg twice daily, lorazepam 1 mg twice daily for anxiety and Adderall XR 20 mg in the morning. Seroquel XR 150 HS, Auvelity 1 BID Received Spravato 84 mg today as scheduled.  Tolerated it well without nausea or vomiting headache or chest pain or palpitations.  Nurse has noted patient has called multiple times sometimes asking the same question repeatedly.  It is unclear whether she is truly forgetful or is just anxious seeking reassurance. Patient acknowledges ongoing depression as well as some anxiety but states she has felt a little better in the last couple of days.  She has reduced the Seroquel to 150 mg at night and has  increased Auvelity to 1 twice daily but only for 1 day.  So far she seems to be tolerating it.  05/03/22 appt noted: clonidine 0.2 mg twice daily, lorazepam 1 mg twice daily for anxiety and Adderall XR 20 mg in the morning. Seroquel XR 150 HS, Auvelity 1 BID BP high this am about 170/100 and received extra clonidine 0.2 mg and came to receive Spravato.  Not dizzy, no SOB, nor CP but BP is still high Could not receive Spravato today bc BP high and pulse low at 30 ppm. Still depressed and anxious. Plan: continue trial Auvelity with Spravato She needs to get BP and pulse managed  05/08/22 TC: RTC  H Michael NA and mailbox full.  Could not leave message.  Pt  -  talked to she and H on speaker. H worried over wife.  Vacant stare.  Slurs words at times.  Not smiling. Reduced enjoyment.  Depression.  Withdrawn from usual activities.  Some irritability.  Anxious. Disc her concerns meds are making her worse.  Extensive discussion about her treatment resistant status.  There is a consistent pattern of not taking the medicines long enough to get benefit because she believes the meds are making her worse.  However the symptoms she describes as side effects are exactly the same symptoms that she had prior to taking the medication RX for  the depression.  So it is not clear that these are actual side effects. This is true about the 2 most recent meds including Seroquel and Auvelity.  Recommend psychiatric consultation in hopes of improving her comfort level with taking prescribed medications for a sufficient length of time to provide benefit. Extensive discussion about ECT is the treatment of choice for treatment resistant depression.  Spravato may work if she can comply with consistency.  There are medication options but they take longer to work.   Plan:  Reduce clonidine to 0.1 mg BID DT bradycardia.  Talk with PCP about BP and low pulse problems which are interfering with her consistent compliance with  Spravato.   Limit lorazepam to 3 -4  mg daily max. Excess use is the cause of slurring speech.  She must stop excess use or will have to stop the med.  Stop Auvelity per her request.  But she has only been on the full dose for a little over a week and clearly has not had time to get benefit from it.  She thinks maybe it is making her more anxious. Reduce Seroquel from 150XR to 50 -100 mg at night IR.  She couldn't sleep when stopped it completely. Will not start new antidepressant until her SE issues are resolved or not. Get second psych opinion from Wellington Hampshire MD or another psychiatrist.  H's sister is therapist in [place] Rink, MD, [facility]  05/16/2022 appointment with the following noted: Received Spravato 84 mg today as scheduled.  Tolerated it well without nausea or vomiting headache or chest pain or palpitations.  She stopped Auvelity as discussed last week. On her own, without physician input, she restarted Wellbutrin XL 450 mg every morning today.  She had taken it in the past.  She feels jittery and anxious. She feels less depressed than she did last week.  But she is still depressed without her usual range of affect.  She still is less social and less motivated than normal. Her primary care doctor increased the dose of losartan Plan: Stop Seroquel Reduce Wellbutrin XL to 300 mg every morning.  Starting the dose at 450 every morning is likely causing side effects of jitteriness and it should not be started at that have a dosage. Recommend she not change meds on her own without MDM put  05/23/2022 appointment with the following noted: Received Spravato 84 mg today as scheduled.  Tolerated it well without nausea or vomiting headache or chest pain or palpitations.  Has not dropped seroquel XR 300 mg 1/2 tablet nightly bc couldn't sleep without it. Has not tried lower dose quetiapine 50 mg HS Still feels depressed.   BP is better managed so far, just saw PCP.  BP is better today and  infact is low today. Dropped clonidine as directed from 0.3 mg BID bc inadequate control of BP to 0.2 mg BID.  However she wants to increase it back to 0.3 mg twice daily because she feels it helped her anxiety better.  Wonders about increasing Wellbutrin for depression.  However she has only been on 300 mg a day for a week.  She was on 450 mg daily in the past.  06/06/22 appt noted: Received Spravato 84 mg today as scheduled.  Tolerated it well without nausea or vomiting headache or chest pain or palpitations.  She is still depressed and anxious.  She wants to try to stop the Seroquel but cannot sleep without some of it.  She is taking lorazepam 1 mg 4 times daily and still having a lot of anxiety.  She wants to increase clonidine back to 0.3 mg twice daily.  She hopes for more improvement She recently went for a second psychiatric opinion as suggested the results of that are pending.  06/11/22 appt noted: Received Spravato 84 mg today as scheduled.  Tolerated it well without nausea or vomiting headache or chest pain or palpitations.  She is still depressed and anxious. Without much change.  Still hopeless, anhedonia, reduced inteterest and motivation.  Tolerating meds. Disc concerns Spravato is not hleping much. Plan: stop Seroquel and start olanzapine 10 mg HS for TRD and anxiety.  06/13/2022 appointment noted: Received Spravato 84 mg today as scheduled.  Tolerated it well without nausea or vomiting headache or chest pain or palpitations.  She is still depressed and anxious. Without much change.  Still hopeless,  anhedonia, reduced inteterest and motivation.  Tolerating meds. Disc concerns Spravato is not helping much as hoped but is improving a bit in the last week. Tolerating meds. Continues Wellbutrin XL 450 AM, tolerating recently started olanzapine  10 mg HS. Sleep is good.   Pending appt with TMS consult.  06/18/22 appt noted: Received Spravato 84 mg today as scheduled.  Tolerated it well  without nausea or vomiting headache or chest pain or palpitations.  Tolerating meds. Continues Wellbutrin XL 450 AM, tolerating recently started olanzapine  10 mg HS. Continues Adderall XR 20 amd and has tried to reduce lorazepam to 1mg  TID Sleep is good.   Pending appt with TMS consult. Depression is a little bit better in the last week with a little improvement in emotional expression and interest.  She is pushing herself to be more active.  Her daughter thought she was a little better than she has been.  However she is still depressed and still not her normal self with anhedonia and reduced emotional expressiveness.  06/20/22 appt noted: Received Spravato 84 mg today as scheduled.  Tolerated it well without nausea or vomiting headache or chest pain or palpitations.  Tolerating meds with a little sleepiness. Continues Wellbutrin XL 450 AM, tolerating recently started olanzapine  10 mg HS. Continues Adderall XR 20 amd and has tried to reduce lorazepam to 1mg  TID Sleep is good.   Mood is improving.  Better funciton.  Anxiety is better with olanzapine. Still not herself and depression not gone with some anhedonia and social avoidance and feeling overwhelmed.  8/14 2023 received Spravato 84 mg 06/27/2022 received Spravato Spravato 84 mg 07/02/2022 received Spravato 84 mg 07/04/2022 received Spravato 84 mg  07/09/2022 appointment noted: Received Spravato 84 mg today as scheduled.  Tolerated it well without nausea or vomiting headache or chest pain or palpitations.  Expected dissociation and feels less depressed with resolution of negative emotions immediately after Spravato and then depression, anxiety creep back in. Continues meds Adderall XR 20 mg every morning, Wellbutrin XL 450 every morning, clonidine 0.1 mg twice daily, lorazepam 1 mg every 6 hours as needed, olanzapine increased from 7.5 to 10 mg nightly on  Tolerating meds.  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  She does note some increased appetite and is somewhat concerned about that but has not gained significant amounts of weight. She has had the TMS consultation which was initially denied but she knows it can be appealed.  However because she is improving with Spravato plus the other medications now she wants to continue the current treatment plan.  07/18/22 appt noted: Continues meds Adderall XR 20 mg every morning, Wellbutrin XL 450 every morning, clonidine 0.1 mg twice daily, lorazepam 1 mg every 6 hours as needed, olanzapine increased from 7.5 to 10 mg nightly on 07/04/2022. Received Spravato 84 mg today as scheduled.  Tolerated it well without nausea or vomiting headache or chest pain or palpitations.  Expected dissociation and feels less depressed with resolution of negative emotions immediately after Spravato and then depression, anxiety creep back in. Continues meds Adderall XR 20 mg every morning, Wellbutrin XL 450 every morning, clonidine 0.1 mg twice daily, lorazepam 1 mg every 6 hours as needed, olanzapine increased from 7.5 to 10 mg nightly on  Tolerating meds.  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  She does note some increased appetite and is somewhat concerned about that but has not gained significant amounts of weight. She has had the TMS consultation which was initially denied but she knows it can be appealed. She continues to have chronic ambivalence about psychiatric medicines and initially tends to blame her depressive symptoms such as decreased concentration and feeling flat on what ever medicine she currently is taking even though she had the same symptoms before the current medicines were started.  Then after discussion she does admit that her depressive  symptoms are improved since adding olanzapine but still has those residual symptoms noted.  07/23/22 received Spravato 84 mg   07/30/2022 appointment noted: Received Spravato 84 mg today as scheduled.  Tolerated it well without nausea or vomiting headache or chest pain or palpitations.  Expected dissociation and feels less depressed with resolution of negative emotions immediately after Spravato and then depression, anxiety creep back in. Continues meds Adderall XR 20 mg every morning, Wellbutrin XL 450 every morning, clonidine 0.1 mg twice daily, lorazepam 1 mg every 6 hours as needed, olanzapine increased from 7.5 to 10 mg nightly on  She has been inconsistent with olanzapine because she continues to be ambivalent about the medications in general and thinks that perhaps the 10 mg is making her feel blunted.  She continues to feel some depression.  She had a good day this week and but still feels somewhat depressed and persistently anxious. Plan: be consistent with olanzapine 10 mg HS for TRD and longer trial for potential benefit for anxiety.  Has not taken it consistently.  08/06/2022 appointment noted: Received Spravato 84 mg today as scheduled.  Tolerated it well without nausea or vomiting headache or chest pain or palpitations.  Expected dissociation and feels less depressed with resolution of negative emotions immediately after Spravato and then depression, anxiety creep back in. Continues meds Adderall XR 20 mg every morning, Wellbutrin XL 450 every morning, clonidine 0.1 mg twice daily, lorazepam 1 mg every 6 hours as needed, olanzapine i 10 mg nightly  She continues to feel depressed but is about 50% better with Spravato.  She is still not herself.  She still has anhedonia.  She still is not her able to engage socially in the typical ways.  She is not jovial and outgoing like normal.  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  She continues to feel that her personality is dampened down.  There is a question about whether it is related to depression or medication. Plan: continue olanzapine 10 for longer trial for TRD and severe anxiety.  08/13/22 appt noted:  Received Spravato 84 mg today as scheduled.  Tolerated it well without nausea or vomiting headache or chest pain or palpitations.  Expected dissociation and feels less depressed with resolution of negative emotions immediately after Spravato and then depression, anxiety creep back in. Continues meds Adderall XR 20 mg every morning, Wellbutrin XL 450 every morning, clonidine 0.1 mg twice daily, lorazepam 1 mg every 6 hours as needed, olanzapine i 10 mg nightly  She still does not feel herself.  Still struggles with depression and low motivation and reduced social engagement and reduced interest and reduced emotional expression.  She is somewhat better with the medicines plus Spravato.  She still believes the Spravato makes  her blunted and is not sure how much it helps her anxiety.  She can have good days when her family is around and she is engaged.  She still wants to stop the olanzapine. She has apparently continued to take the trazodone despite having been told to stop it when she started olanzapine.  She feels like she needs the trazodone. Plan: DC olanzapine and Start nortriptyline 25 mg nightly and build up to 75 mg nightly and then check blood level.    08/27/2022 appointment noted: Received Spravato 84 mg today as scheduled.  Tolerated it well without nausea or vomiting headache or chest pain or palpitations.  Expected dissociation and feels less depressed with resolution of negative emotions immediately after Spravato and then depression, anxiety creep back in. Continues meds Adderall XR 20 mg every morning, Wellbutrin XL 450 every morning, clonidine 0.1 mg twice daily, lorazepam 1 mg every 6 hours as needed. Stopped  olanzapine and started nortriptyline which she has taken for about a week is 75 mg nightly. So far she is tolerating the nortriptyline well with the exception of some dry mouth and constipation which she is working to manage.  She does not feel substantially better better or different off the olanzapine.  No change in her sleep which is good.  Main concern currently in addition to the residual depression is anxiety which is somewhat situational with pending arch show.  She is worrying about it more than normal.  Says she is having to take lorazepam twice a day where she had been able to keep reduce it prior to this.  She still does not feel like herself with residual depression with less social interest and less of her usual buoyancy in personality.  She is flatter than normal.  Overall she still feels that the Spravato has been helpful at reducing the severity of the depression.  She is not suicidal. She has not heard anything about the TMS appeal as of yet.  09/05/2022 appointment noted: Received Spravato 84 mg today as scheduled.  Tolerated it well without nausea or vomiting headache or chest pain or palpitations.  Expected dissociation and feels less depressed with resolution of negative emotions immediately after Spravato and then depression, anxiety creep back in. Continues meds Adderall XR 20 mg every morning, Wellbutrin XL 450 every morning, clonidine 0.1 mg twice daily, lorazepam 1 mg every 6 hours as needed. Stopped olanzapine and started nortriptyline which she has taken for about 2 week is 75 mg nightly. Initially blood pressure was a little high causing delay in starting Spravato.  She admitted to feeling a little wound up.  She still experiences a little increase in depression if she goes longer than a week in between doses of Spravato.  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  09/10/22 appt noted: Received Spravato 84 mg today as scheduled.  Tolerated it well without nausea or vomiting headache or chest pain or palpitations.  Expected dissociation gradually resolved over the 2 hour observation period. She feels 50% less depressed with Spravto and wants to continue it.   Continues meds Adderall XR 20 mg every morning, Wellbutrin XL 450 every morning, clonidine 0.1 mg twice daily, lorazepam 1 mg every 6 hours as needed. Has started nortriptyline 75 mg nightly for about 3 weeks. Has not seen a significant difference with the addition of nortriptyline.  Tolerating it pretty  well. She continues to have some degree of anhedonia and significant depression and anxiety.  Her daughters noticed that she is more needy and calls more frequently.  She acknowledges this as well.  She is clearly still not herself. Plan: pramipexole off label and RX 0.25 mg BID  09/17/2022 appointment noted: Received Spravato 84 mg today as scheduled.  Tolerated it well without nausea or vomiting headache or chest pain or palpitations.  Expected dissociation gradually resolved over the 2 hour observation period. She feels 50% less depressed with Spravto and wants to continue it.   Continues meds Adderall XR 20 mg every morning, Wellbutrin XL 450 every morning, clonidine 0.1 mg twice daily, lorazepam 1 mg every 6 hours as needed. Has started nortriptyline 75 mg nightly for about 3 weeks and DT level 176, reduced to 50 mg HS early November. Still the same sx as noted last visit.  Tolerating meds.   Compliant.  Still depressed and family notices.  Has been able to participate in family interactions.  Some post-show let down and has to do detailed work which is hard for her bc ADD.  Sleep and eating well.  Energy OK but not great.  No SI.  Not cried in a year or so.  Clearly less depressed and hopeless than before the Spravato.  09/24/22 appt noted: Received Spravato 84 mg today as scheduled.   Tolerated it well without nausea or vomiting headache or chest pain or palpitations.  Expected dissociation gradually resolved over the 2 hour observation period. She feels 50% less depressed with Spravto and wants to continue it.   Continues meds Adderall XR 20 mg every morning, Wellbutrin XL 450 every morning, clonidine 0.1 mg twice daily, lorazepam 1 mg every 6 hours as needed. Has started nortriptyline 75 mg nightly for about 3 weeks and DT level 176, reduced to 50 mg HS early November. Still the same sx as noted last visit.  Tolerating meds.   Compliant.  Still depressed and family notices.  Has been able to participate in family interactions.  Some post-show let down and has to do detailed work which is hard for her bc ADD.  Sleep and eating well.  Energy OK but not great.  No SI.  Not cried in a year or so.  Clearly less depressed and hopeless than before the Spravato. Is not making further progress generally.  Stuck with moderate depression  10/02/22 appt noted: Received Spravato 84 mg today as scheduled.  Tolerated it well without nausea or vomiting headache or chest pain or palpitations.  Expected dissociation gradually resolved over the 2 hour observation period. She feels 50% less depressed with Spravto and wants to continue it.   Continues meds Adderall XR 20 mg every morning, Wellbutrin XL 450 every morning, clonidine 0.1 mg twice daily, lorazepam 1 mg every 6 hours as needed. Has started nortriptyline 75 mg nightly for about 3 weeks and DT level 176, reduced to 50 mg HS early November. Still the same sx as noted last visit.  Tolerating meds.   Compliant.  Still depressed and family notices.  Has been able to participate in family interactions.  Some post-show let down and has to do detailed work which is hard for her bc ADD.  Sleep and eating well.  Energy OK but not great.  No SI.  Not cried in a year or so.  Clearly less depressed and hopeless than before the Spravato. Is not making  further progress generally.  Stuck with moderate  depression.  Is able to function pretty normally. Plan: trial pramipexole 0.25 mg BID off label for depression.   10/08/22 appt noted: Received Spravato 84 mg today as scheduled.  Tolerated it well without nausea or vomiting headache or chest pain or palpitations.  Expected dissociation gradually resolved over the 2 hour observation period. She feels 50% less depressed with Spravto and wants to continue it.   Continues meds Adderall XR 20 mg every morning, Wellbutrin XL 450 every morning, clonidine 0.1 mg twice daily, lorazepam 1 mg every 6 hours as needed. Has started nortriptyline 75 mg nightly for about 3 weeks and DT level 176, reduced to 50 mg HS early November. Still the same sx as noted last visit.  Tolerating meds.   Compliant.  Still depressed and family notices.  Has been able to participate in family interactions.  Some post-show let down and has to do detailed work which is hard for her bc ADD.  Sleep and eating well.  Energy OK but not great.  No SI.  Not cried in a year or so.  Clearly less depressed and hopeless than before the Spravato. Is not making further progress generally.  Stuck with moderate depression.  Behaved and felt pretty normally with family over for Thanksgiving. Doesn't see benefit or SE with pramipexole but thinks maybe it makes her worse. Plan:  increase pramipexole augmentation off label to 0.5 mg BID  10/15/2022 appointment noted: Received Spravato 84 mg today as scheduled.  Tolerated it well without nausea or vomiting headache or chest pain or palpitations.  Expected dissociation gradually resolved over the 2 hour observation period. She feels 50% less depressed with Spravto and wants to continue it.   Continues meds Adderall XR 20 mg every morning, Wellbutrin XL 450 every morning, clonidine 0.2 mg twice daily, lorazepam 1 mg every 6 hours as needed.  Trazodone 50 mg tablets 1-2 nightly as needed insomnia Has  started nortriptyline 75 mg nightly for about 3 weeks and DT level 176, reduced to 50 mg HS early November. Recommended increase pramipexole 0.5 mg twice daily off label for treatment resistant depression on 10/08/2022. She feels better motivated more active with pramipexole 0.5 mg twice daily.  She still is depressed but it is better.  We discussed the possibility of going up in the dose but did not change it.  10/22/2022 appointment noted: Received Spravato 84 mg today as scheduled.  Tolerated it well without nausea or vomiting headache or chest pain or palpitations.  Expected dissociation gradually resolved over the 2 hour observation period. She feels 50% less depressed with Spravto and wants to continue it.   Continues meds Adderall XR 20 mg every morning, Wellbutrin XL 450 every morning, clonidine 0.2 mg twice daily, lorazepam 1 mg every 8 hours as needed.  Trazodone 50 mg tablets 1-2 nightly as needed insomnia Has started nortriptyline 75 mg nightly for about 3 weeks and DT level 176, reduced to 50 mg HS early November. pramipexole 0.5 mg twice daily off label for treatment resistant depression on 10/08/2022. She is better motivated than she was.  She is journaling 3 pages a day.  She has started walking and has walked 5 days a week for 50 minutes for the last 2 weeks.  That is significantly helped her mood.  Her mood tends to be better when she interacts with family.  However she still has some periods of depression.  She is tolerating the medications well.  She still feels like her affect and  confidence is not back to normal. Plan:  increase pramipexole augmentation off label to 0.5 mg tID or 0.75 mg BID   10/29/22 appt noted: Received Spravato 84 mg today as scheduled.  Tolerated it well without nausea or vomiting headache or chest pain or palpitations.  Expected dissociation gradually resolved over the 2 hour observation period. She feels 50% less depressed with Spravto and wants to  continue it.   Continues meds Adderall XR 20 mg every morning, Wellbutrin XL 450 every morning, clonidine 0.2 mg twice daily, lorazepam 1 mg every 8 hours as needed.  Trazodone 50 mg tablets 1-2 nightly as needed insomnia Has started nortriptyline 75 mg nightly for about 3 weeks and DT level 176, reduced to 50 mg HS early November. pramipexole increased to 0.75 mg twice daily off label for treatment resistant depression  She has been feeling somewhat better with the increase in pramipexole and is tolerating it well.  She still is easily overwhelmed.  Her affect and mood can improve now when around her family or doing something positive.  She has been able to be more productive. She is tolerating the current medications. She is still considering TMS as an alternative to Spravato.  11/15/2022 appointment noted: Received Spravato 84 mg today as scheduled.  Tolerated it well without nausea or vomiting headache or chest pain or palpitations.  Expected dissociation gradually resolved over the 2 hour observation period. She feels 50% less depressed with Spravto and wants to continue it.   Continues meds Adderall XR 20 mg every morning, Wellbutrin XL 450 every morning, clonidine 0.2 mg twice daily, lorazepam 1 mg every 8 hours as needed.  Trazodone 50 mg tablets 1-2 nightly as needed insomnia Has started nortriptyline 75 mg nightly for about 3 weeks and DT level 176, reduced to 50 mg HS early November. pramipexole increased to 0.75 mg twice daily off label for treatment resistant depression  He has noticed some increase in depression due to the length of time since the last Spravato administration.  She believes Spravato is helping her.  sHe is not suicidal but has felt very blue the last few days. She is tolerating the medication. She is ambivalent about Spravato versus TMS but she is considering TMS. She wants to continue Spravato administration because it is clearly helpful.  11/19/22 appt noted: Received  Spravato 84 mg today as scheduled.  Tolerated it well without nausea or vomiting headache or chest pain or palpitations.  Expected dissociation gradually resolved over the 2 hour observation period. She feels 50% less depressed with Spravto and wants to continue it.   Continues meds Adderall XR 20 mg every morning, Wellbutrin XL 450 every morning, clonidine 0.2 mg twice daily, lorazepam 1 mg every 8 hours as needed.  Trazodone 50 mg tablets 1-2 nightly as needed insomnia Has started nortriptyline 75 mg nightly for about 3 weeks and DT level 176, reduced to 50 mg HS early November. pramipexole increased to 0.75 mg twice daily off label for treatment resistant depression  She has felt better back on Spravato more regularly.  However she still has residual depression esp when alone or when wihtout activity.  Can function when needed.   Does not have her connfidence back. She plans to pursue TMS availability. Have discussed retrying Auvelity  11/28/22 appt noted: Received Spravato 84 mg today as scheduled.  Tolerated it well without nausea or vomiting headache or chest pain or palpitations.  Expected dissociation gradually resolved over the 2 hour observation period. She  feels 50% less depressed with Spravto and wants to continue it.   Continues meds Adderall XR 20 mg every morning, Wellbutrin XL 450 every morning, clonidine 0.2 mg twice daily, lorazepam 1 mg every 8 hours as needed.  Trazodone 50 mg tablets 1-2 nightly as needed insomnia Has started nortriptyline 75 mg nightly for about 3 weeks and DT level 176, reduced to 50 mg HS early November. pramipexole increased to 0.75 mg twice daily off label for treatment resistant depression  Last week she was more depressed than usual for reasons that are not clear.  She has not had a clear answer from Filomena Jungling about [employer] options.  States that they have not returned her call.  She is asked about Auvelity retry in place of Wellbutrin.  Has still been  walking.  12/03/22 appt noted: Received Spravato 84 mg today as scheduled.  Tolerated it well without nausea or vomiting headache or chest pain or palpitations.  Expected dissociation gradually resolved over the 2 hour observation period. She feels 50% less depressed with Spravto and wants to continue it.   Continues meds Adderall XR 20 mg every morning, Wellbutrin XL 450 every morning, clonidine 0.2 mg twice daily, lorazepam 1 mg every 8 hours as needed.  Trazodone 50 mg tablets 1-2 nightly as needed insomnia started nortriptyline 75 mg nightly for about 3 weeks and DT level 176, reduced to 50 mg HS early November. pramipexole increased to 0.75 mg twice daily off label for treatment resistant depression  No SE .  Satisfied with meds. Depression is better in the last week.  Not sure why that is the case.  Still walking daily and that helps and journaling 3 pages daily.  Working on Biomedical scientist.  No changes desire.  Clearly benefits from Spravato but not 100%.  Still considering TMS.  12/10/22 appt noted: Received Spravato 84 mg today as scheduled.  Tolerated it well without nausea or vomiting headache or chest pain or palpitations.  Expected dissociation gradually resolved over the 2 hour observation period. She feels 50% less depressed with Spravto and wants to continue it.   Continues meds Adderall XR 20 mg every morning, Wellbutrin XL 450 every morning, clonidine 0.2 mg twice daily, lorazepam 1 mg every 8 hours as needed.  Trazodone 50 mg tablets 1-2 nightly as needed insomnia started nortriptyline 75 mg nightly for about 3 weeks and DT level 176, reduced to 50 mg HS early November. pramipexole increased to 0.75 mg twice daily off label for treatment resistant depression  No SE .   Depression was worse this week for no apparent reason.  She struggled being positive.  She has felt more discouraged.  She has felt more anxious.  She has had a hard time doing tasks.  She is interested in retrying the  [place] which we had discussed previously. Plan: She agrees to [employer].  To improve tolerability and reduce risk of side effects, Stop Wellbutrin and start Auvelity 1 in the morning for 1 week then 1 twice daily AndReduce pramipexole 0.5 mg BID and reduce nortriptyline to 50 mg HS  12/17/22 appt noted: Received Spravato 84 mg today as scheduled.  Tolerated it well without nausea or vomiting headache or chest pain or palpitations.  Expected dissociation gradually resolved over the 2 hour observation period. She feels 50% less depressed with Spravto and wants to continue it.   Continues meds Adderall XR 20 mg every morning, stopped Wellbutrin XL 150 every morning, clonidine 0.2 mg twice daily, lorazepam  1 mg every 8 hours as needed.  Trazodone 50 mg tablets 1-2 nightly as needed insomnia Has started nortriptyline 75 mg nightly for about 3 weeks and DT level 176, reduced to 50 mg HS early November. pramipexole 0.375 mg twice daily off label for treatment resistant depression with plan to stop Started Auvelity 1 AM Still depressed to moderate degree.  Tolerating Auvelity so far.  No other problems with meds.  Willing to give Auvelity a chance. Plan: She agrees to [employer].  Continue 1 AM and when tolerated then increase to BID Stop pramipexole.  12/26/22 received Spravato  01/01/23 appt noted: Received Spravato 84 mg today as scheduled.  Tolerated it well without nausea or vomiting headache or chest pain or palpitations.  Expected dissociation gradually resolved over the 2 hour observation period. She feels 50% less depressed with Spravto and wants to continue it.   Continues meds Adderall XR 20 mg every morning, stopped Wellbutrin XL 150 every morning, clonidine 0.2 mg twice daily, lorazepam 1 mg every 8 hours as needed.  Trazodone 50 mg tablets 1-2 nightly as needed insomnia Has started nortriptyline 75 mg nightly for about 3 weeks and DT level 176, reduced to 50 mg HS early  November. Forgot to reduce pramipexole stoll taking  0.5 mg twice daily off label for treatment resistant depression  Started Auvelity 1 AM & PM last week. Occ misses Spravato DT htn.this week a better than last.   Painting again more and it helps mood.  Confidence still low and not as likely to socialize as normal but enjoys family.   Still ambivalent about TMS. Tolerating meds.  Including the increase in [place].    01/08/23 appt noted: Received Spravato 84 mg today as scheduled.  Tolerated it well without nausea or vomiting headache or chest pain or palpitations.  Expected dissociation gradually resolved over the 2 hour observation period. She feels 50% less depressed with Spravto and wants to continue it.   Continues meds Adderall XR 20 mg every morning, stopped Wellbutrin XL 150 every morning, clonidine 0.2 mg twice daily, lorazepam 1 mg every 8 hours as needed.  Trazodone 50 mg tablets 1-2 nightly as needed insomnia Has started nortriptyline 75 mg nightly for about 3 weeks and DT level 176, reduced to 50 mg HS early November. reduced pramipexole to 0.5 mg daily off label for treatment resistant depression  Started Auvelity 1 AM & PM mid Feb. More depressed as week progresses.  Thinks it is worse with less pramipexole.  More negative.  Able to function but feels miserable.  No SI.  Hopeless.  Sleep ok.  Tolerating meds.   Talked to [facility] about TMS and appt mid March. Plan: increase pramipexole to 0.5 mg TID for dep bc seemed to worsen with the reduction.  01/15/23 appt noted: Received Spravato 84 mg today as scheduled.  Tolerated it well without nausea or vomiting headache or chest pain or palpitations.  Expected dissociation gradually resolved over the 2 hour observation period. She feels 50% less depressed with Spravto and wants to continue it.   Continues meds Adderall XR 20 mg every morning, stopped Wellbutrin XL 150 every morning, clonidine 0.2 mg twice daily, lorazepam 1 mg every 8  hours as needed.  Trazodone 50 mg tablets 1-2 nightly as needed insomnia Has started nortriptyline 75 mg nightly for about 3 weeks and DT level 176, reduced to 50 mg HS early November. Increased pramipexole to 0.5 mg  to TID  off label for treatment  resistant depression  Started Auvelity 1 AM & PM mid Feb. markedly better depression the increase in pramipexole.  She feels like her depression is almost resolved.  She is very pleased with the response.  She is tolerating the medications well  01/30/23 appt noted: Received Spravato 84 mg today as scheduled.  Tolerated it well without nausea or vomiting headache or chest pain or palpitations.  Expected dissociation gradually resolved over the 2 hour observation period. She feels 50% less depressed with Spravto and wants to continue it.   Continues meds Adderall XR 20 mg every morning, stopped Wellbutrin XL 150 every morning, clonidine 0.2 mg twice daily, lorazepam 1 mg every 8 hours as needed.  Trazodone 50 mg tablets 1-2 nightly as needed insomnia Has started nortriptyline 75 mg nightly for about 3 weeks and DT level 176, reduced to 50 mg HS early November. Increased pramipexole to 0.5 mg  to TID  off label for treatment resistant depression  Started Auvelity 1 AM & PM mid Feb. Mood markedly better with pramipexole added.  Nearly normal mood now with minimal depression.  More social and outgoing and motivated and resolved anhedonia. No SE.  Compliant.  02/04/23 appt noted: Continues meds Adderall XR 20 mg every morning, stopped Wellbutrin XL 150 every morning, clonidine 0.2 mg twice daily, lorazepam 1 mg every 8 hours as needed.  Trazodone 50 mg tablets 1-2 nightly as needed insomnia Has started nortriptyline 75 mg nightly for about 3 weeks and DT level 176, reduced to 50 mg HS early November. Increased pramipexole to 0.5 mg  to TID  off label for treatment resistant depression  Started Auvelity 1 AM & PM mid Feb. Received Spravato 84 mg today as  scheduled.  Tolerated it well without nausea or vomiting headache or chest pain or palpitations.  Expected dissociation gradually resolved over the 2 hour observation period. She feels 50% less depressed with Spravto and wants to continue it.  The pramipexole got her the rest of the way better.  Doesn't want to cut back on any tx bc afraid of relapse. Tolerating meds.  02/15/23 appt noted: Continues meds Adderall XR 20 mg every morning, stopped Wellbutrin XL 150 every morning, clonidine 0.2 mg twice daily, lorazepam 1 mg every 8 hours as needed.  Trazodone 50 mg tablets 1-2 nightly as needed insomnia Has started nortriptyline 75 mg nightly for about 3 weeks and DT level 176, reduced to 50 mg HS early November. Increased pramipexole to 0.5 mg  to TID  off label for treatment resistant depression  Started Auvelity 1 AM & PM mid Feb. Received Spravato 84 mg today as scheduled.  Tolerated it well without nausea or vomiting headache or chest pain or palpitations.  Expected dissociation gradually resolved over the 2 hour observation period. She feels no longer depressed.   The pramipexole got her the rest of the way better.  Doesn't want to cut back on any tx bc afraid of relapse. Tolerating meds.  02/18/23 appt noted: Continues meds Adderall XR 20 mg every morning, stopped Wellbutrin XL 150 every morning, clonidine 0.2 mg twice daily, lorazepam 1 mg every 8 hours as needed.  Trazodone 50 mg tablets 1-2 nightly as needed insomnia Has started nortriptyline 75 mg nightly for about 3 weeks and DT level 176, reduced to 50 mg HS early November. Increased pramipexole to 0.5 mg  to TID  off label for treatment resistant depression  Started Auvelity 1 AM & PM mid Feb. Received Spravato 84 mg today  as scheduled.  Tolerated it well without nausea or vomiting headache or chest pain or palpitations.  Expected dissociation gradually resolved over the 2 hour observation period. She feels no longer depressed except when  ran out of Auvelity this week DT lack of availability.  Much worse without it..   The pramipexole got her the rest of the way better.  Doesn't want to cut back on any tx bc afraid of relapse. Tolerating meds.  02/25/23 appt: Received Spravato 84 mg today as scheduled.  Tolerated it well without nausea or vomiting headache or chest pain or palpitations.  Expected dissociation gradually resolved over the 2 hour observation period. Continues meds Adderall XR 20 mg every morning, stopped Wellbutrin XL 150 every morning, clonidine 0.2 mg twice daily, lorazepam 1 mg every 8 hours as needed.  Trazodone 50 mg tablets 1-2 nightly as needed insomnia Has started nortriptyline 75 mg nightly for about 3 weeks and DT level 176, reduced to 50 mg HS early November. Increased pramipexole to 0.5 mg  to TID  off label for treatment resistant depression  Started Auvelity 1 AM & PM mid Feb. No SE Still dramatically better with meds and Spravato.  Not 100% over depression. Had GS born last week and able to enjoy it now.  Is a little sleepy with pramipexole but manageable.  No change desired.  Sleep is ok.  03/04/23 appt noted: Received Spravato 84 mg today as scheduled.  Tolerated it well without nausea or vomiting headache or chest pain or palpitations.  Expected dissociation gradually resolved over the 2 hour observation period. Continues meds Adderall XR 20 mg every morning, stopped Wellbutrin XL 150 every morning, clonidine 0.2 mg twice daily, lorazepam 1 mg every 8 hours as needed.  Trazodone 50 mg tablets 1-2 nightly as needed insomnia nortriptyline reduced to 50 mg HS early November. Increased pramipexole to 0.5 mg  to TID  off label for treatment resistant depression  Started Auvelity 1 AM & PM mid Feb. Still generally doing well with meds and Spravato except when got tooth abscess.  Freels more depressed after dental surgery.   No SE with meds.   Sleep is OK.  No med changes desired    ECT-MADRS     Flowsheet Row Clinical Support from 01/08/2023 in [facility] Clinical Support from 08/06/2022 in [facility] Clinical Support from 07/04/2022 in [facility] Clinical Support from 05/21/2022 in [facility] Office Visit from 03/02/2022 in [facility]  MADRS Total Score 21 29 15 27  46        Past Psychiatric Medication Trials: fluoxetine, duloxetine, Viibryd, Pristiq, sertraline, citalopram,  Trintellix anxious and SI Wellbutrin XL 450 Auvelity 1 dose nortriptyline 75 mg nightly for about 3 weeks and DT level 176, reduced to 50 mg HS NR. Pramipexole 1 mg NR Adderall, Adderall XR, Vyvanse, Ritalin, Strattera low dose NR Lorazepam Trazodone  Depakote,  lamotrigine cog complaints Lithium remotely Abilify 7.5  Vraylar 1.5 mg daily agitation and insomnia Rexulti insomnia Latuda 40 one dose, CO anxious and SI Seroquel XR 300 Olanzapine 10  At visit November 12, 2019. We discussed Patient developed an increasingly severe alcohol dependence problem since her last visit in January.  She went to [facility] and has had no alcohol since then except 1 day.  She never abused stimulants but they took her off the stimulants at [facility].  Her ADD was markedly worse.  The Wellbutrin did not help the ADD.   D history lamotrigine rash at 67 yo  Review of Systems:  Review of Systems  Constitutional:  Negative for fatigue.  Musculoskeletal:  Positive for back pain. Negative for arthralgias and joint swelling.       SP hip surgery October 2020  Neurological:  Negative for dizziness and tremors.  Psychiatric/Behavioral:  Positive for decreased concentration and dysphoric mood. Negative for agitation, behavioral problems, confusion, hallucinations, self-injury, sleep disturbance and suicidal ideas. The patient is nervous/anxious. The patient is not  hyperactive.        Forgetful at times about med recommendations.  Send present for him The only [place] she notices a it states it is unbelievable him how how much are her 3 steps around freedom of the present fall and just in the past decade  Medications: I have reviewed the patient's current medications.  Current Outpatient Medications  Medication Sig Dispense Refill   amLODipine (NORVASC) 2.5 MG tablet Take 2.5 mg by mouth daily.     amphetamine-dextroamphetamine (ADDERALL XR) 20 MG 24 hr capsule Take 1 capsule (20 mg total) by mouth every morning. 30 capsule 0   [START ON 03/26/2023] amphetamine-dextroamphetamine (ADDERALL XR) 20 MG 24 hr capsule Take 1 capsule (20 mg total) by mouth every morning. 30 capsule 0   buPROPion (WELLBUTRIN XL) 150 MG 24 hr tablet TAKE 3 TABLETS BY MOUTH DAILY (Patient taking differently: Take 150 mg by mouth daily.) 270 tablet 1   cloNIDine (CATAPRES) 0.2 MG tablet TAKE 1 TABLET BY MOUTH 2 TIMES DAILY. 180 tablet 0   Dextromethorphan-buPROPion ER (AUVELITY) 45-105 MG TBCR Take 1 tablet by mouth 2 (two) times daily. 60 tablet 3   Esketamine HCl, 84 MG Dose, (SPRAVATO, 84 MG DOSE,) 28 MG/DEVICE SOPK USE 3 SPRAYS IN EACH NOSTRIL ONCE A WEEK 3 each 1   iron polysaccharides (NIFEREX) 150 MG capsule TAKE 1 CAPSULE BY MOUTH EVERY DAY 90 capsule 1   LORazepam (ATIVAN) 1 MG tablet Take 1 tablet (1 mg total) by mouth every 8 (eight) hours as needed. for anxiety 90 tablet 1   losartan (COZAAR) 50 MG tablet Take 50 mg by mouth daily.     nebivolol (BYSTOLIC) 2.5 MG tablet Take 2.5 mg by mouth daily.     nortriptyline (PAMELOR) 25 MG capsule Take 2 capsules (50 mg total) by mouth at bedtime. 180 capsule 0   pramipexole (MIRAPEX) 0.5 MG tablet Take 1 tablet (0.5 mg total) by mouth 3 (three) times daily. 90 tablet 1   traZODone (DESYREL) 50 MG tablet TAKE 1-2 TABLETS BY MOUTH NIGHTLY AS NEEDED FOR SLEEP 180 tablet 1   No current facility-administered medications for this  visit.    Medication Side Effects: None  Allergies:  Allergies  Allergen Reactions   Metronidazole Shortness Of Breath and Other (See Comments)    Heart pounding   Ferrlecit [Na Ferric Gluc Cplx In Sucrose] Other (See Comments)    Infusion reaction 05/12/2019    Past Medical History:  Diagnosis Date   ADHD    Anemia    Anxiety    Arthritis    Depression    Heart murmur    i went to see a cardiologit slast eyar  and i had zero plaque,    PONV (postoperative nausea and vomiting)    Recovering alcoholic in remission     Family History  Problem Relation Age of Onset   Atrial fibrillation Mother    CAD Father  Past Medical History, Surgical history, Social history, and Family history were reviewed and updated as appropriate.   Please see review of systems for further details on the patient's review from today.   Objective:   Physical Exam:  There were no vitals taken for this visit.  Physical Exam Constitutional:      General: She is not in acute distress. Neurological:     Mental Status: She is alert and oriented to person, place, and time.     Coordination: Coordination normal.     Gait: Gait normal.  Psychiatric:        Attention and Perception: Attention and perception normal.        Mood and Affect: Mood is anxious and depressed. Affect is not tearful.        Speech: Speech is not rapid and pressured.        Behavior: Behavior is not slowed.        Thought Content: Thought content is not paranoid or delusional. Thought content does not include homicidal or suicidal ideation. Thought content does not include suicidal plan.        Cognition and Memory: Cognition normal. Memory is not impaired.        Judgment: Judgment normal.     Comments: Insight intact. No auditory or visual hallucinations. No delusions.  Depression nearly resolved with pramipexole TID and relapsed when out of Auvelity.     Lab Review:     Component Value Date/Time   NA 137  01/12/2021 1430   NA 140 11/18/2018 1544   K 3.8 01/12/2021 1430   CL 108 01/12/2021 1430   CO2 22 01/12/2021 1430   GLUCOSE 94 01/12/2021 1430   BUN 14 01/12/2021 1430   BUN 20 11/18/2018 1544   CREATININE 0.82 01/12/2021 1430   CALCIUM 8.9 01/12/2021 1430   PROT 6.6 01/12/2021 1430   ALBUMIN 3.9 01/12/2021 1430   AST 12 (L) 01/12/2021 1430   ALT 11 01/12/2021 1430   ALKPHOS 46 01/12/2021 1430   BILITOT 0.5 01/12/2021 1430   GFRNONAA >60 01/12/2021 1430   GFRAA >60 09/02/2019 0249   GFRAA >60 01/27/2019 0811       Component Value Date/Time   WBC 4.5 01/12/2021 1430   RBC 4.32 01/12/2021 1430   HGB 12.8 01/12/2021 1430   HGB 12.9 07/17/2019 0953   HCT 38.5 01/12/2021 1430   HCT 21.9 (L) 12/25/2018 1221   PLT 272 01/12/2021 1430   PLT 286 07/17/2019 0953   MCV 89.1 01/12/2021 1430   MCH 29.6 01/12/2021 1430   MCHC 33.2 01/12/2021 1430   RDW 12.4 01/12/2021 1430   LYMPHSABS 1.4 01/12/2021 1430   MONOABS 0.4 01/12/2021 1430   EOSABS 0.0 01/12/2021 1430   BASOSABS 0.0 01/12/2021 1430    No results found for: "POCLITH", "LITHIUM"   No results found for: "PHENYTOIN", "PHENOBARB", "VALPROATE", "CBMZ"   .res Assessment: Plan:    Recurrent major depression resistant to treatment  Generalized anxiety disorder  Attention deficit hyperactivity disorder (ADHD), predominantly inattentive type  Insomnia due to mental condition  Accelerated hypertension   She has treatment resistant major depression ongoing with 50% better with Spravato. Have  discussed some of her  abnormal behaviors last year leading to this depressive episode getting worse which she says were associated with heavy use of delta 8 and not a manic episode.  She realizes now that that was not good for her.  She stopped all use of other drugs including those  available over-the-counter such as delta 8 or any other THC related products.  She is no longer having any of those types of behaviors and instead is  depressed.  She is experiencing more pleasure and is less blunted and enjoying more and better inteest versus before the Spravato.  She also feels worse if misses a dose of Spravato.  Depression resolved recently after increasing pramipexole 0.5 mg TID, brief relapse when out of Auvelity Consider switch to ER to reduce sleepiness  Patient was administered Spravato 84 mg intranasally today.  The patient experienced the typical dissociation which gradually resolved over the 2-hour period of observation.  There were no complications.  Specifically the patient did not have nausea or vomiting or headache.  Blood pressures monitored at the 40-minute and 2-hour follow-up intervals.  Borderline high.  By the time the 2-hour observation period was met the patient was alert and oriented and able to exit without assistance.  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  See nursing note for further details.She wants to continue Spravato. We discussed discussed the side effects in detail as well as the protocol required to receive Spravato.   Failed multiple antidepressants.  Many of them were not actual failures but intolerances and it is unclear whether some of that was more connected with anxiety than true side effects.  1 example is the [place].  In general she does not want to try anything but an antidepressant but has failed all major categories of antidepressants except TCAs and MAO inhibitors which have not been tried until now.  Started nortriptyline 75 mg nightly.  Serum level 176.  So Reduced to 50 mg HS. Consider stopping it.  Discussed side effects in detail.  Needs more time to help. Extensive discussion previously about her ambivalence about meds and missatributing sx of depression as SE of meds.    She is tolerating Auvelity.  Continue 1  BID bc more depressed without it.  Worse with less pramipexole.  Increased to 0.5 mg TID on about end of Feb 2024 and markedly better Dosing range off label for depression ranges from 1-5 mg daily.  Disc risk compulsive impulsive behavior and manis.    Started Spravato 84 mg twice weekly on 03/16/2022.  Now on weekly administration  Adderall  XR 20 mg AM   Ativan 1 mg 3 times daily as needed anxiety but try to cut it back.  She still feels she needs it. Is not ideal to use benzodiazepine with stimulant but because of the severity of her symptoms it has been necessary.  Hope to eventually eliminate the benzodiazepine.  Expected as her depression improves her anxiety will improve as well.   We will expect that to improve as the depression improves.  She has headed insturctions to reduce this.  Continue clonidine 0.2 mg BID off label for anxiety and helps BP partially. BP is better controlled but not consistent.  Consider increasing amlodipine.  Also on losartan 50  Discussed potential benefits, risks, and side effects of stimulants with patient to include increased heart rate, palpitations, insomnia, increased anxiety, increased irritability, or decreased appetite.  Instructed patient to contact office if experiencing any significant tolerability issues. She wants to return to usual dose of Adderall for ADD bc of mor poor cognitive function with reduction.  Also discussed that depression will impair cognitive function.  Disc risk polypharmacy.  Reevaluate meds after better with pramipexole.  Relapsed  after running out of Auvelity so needs clearly it and pramipexole.  Give it more time.  Rec keep track of BP and discuss with PCP. It has been up and down.  Sometimes needs extra clonidine before Spravato  Has Maintained sobriety  FU with Spravato weekly   Meredith Staggers, MD, DFAPA     Please see After Visit Summary for patient specific instructions.  Future Appointments  Date Time Provider Department Center  03/11/2023  1:00 PM Cottle, Steva Ready., MD [facility]  None  03/11/2023  1:00 PM CP-NURSE [facility] None                   No orders of the defined types were placed in this encounter.      -------------------------------

## 2023-03-11 ENCOUNTER — Ambulatory Visit (INDEPENDENT_AMBULATORY_CARE_PROVIDER_SITE_OTHER): Payer: 59 | Admitting: Psychiatry

## 2023-03-11 ENCOUNTER — Encounter: Payer: Self-pay | Admitting: Psychiatry

## 2023-03-11 ENCOUNTER — Other Ambulatory Visit: Payer: Self-pay | Admitting: Psychiatry

## 2023-03-11 ENCOUNTER — Ambulatory Visit: Payer: 59

## 2023-03-11 VITALS — BP 90/66 | HR 86

## 2023-03-11 DIAGNOSIS — I10 Essential (primary) hypertension: Secondary | ICD-10-CM

## 2023-03-11 DIAGNOSIS — F411 Generalized anxiety disorder: Secondary | ICD-10-CM

## 2023-03-11 DIAGNOSIS — F339 Major depressive disorder, recurrent, unspecified: Secondary | ICD-10-CM | POA: Diagnosis not present

## 2023-03-11 DIAGNOSIS — F5105 Insomnia due to other mental disorder: Secondary | ICD-10-CM | POA: Diagnosis not present

## 2023-03-11 DIAGNOSIS — F900 Attention-deficit hyperactivity disorder, predominantly inattentive type: Secondary | ICD-10-CM

## 2023-03-11 NOTE — Progress Notes (Signed)
NURSES NOTE:   Patient arrived for her weekly 84mg  Spravato treatment. Pt is being treated for Treatment Resistant Depression this is #58 treatment, pt will be receiving 84 mg which will continue to be her maintenance dose, she receives weekly treatments.  Patient arrived and taken to treatment room. Confirmed she had a ride home which is her husband would be coming back to pick up pt when done and sometimes she needs to use [facility] if he is unable to pick her up. Pt's Spravato is ordered through [employer] and delivered to office, all Spravato medication is stored at doctors office per REMS/FDA guidelines. The medication is required to be locked behind two doors per FDA/REMS Protocol. Medication is also disposed of properly per regulations. Pt's prior authorization for Spravato 84 mg was renewed with an approval through 07/17/2023 with [facility]. All treatments with vital signs documented with Spravato REMS per protocol of being a treatment center.    Began taking patient's vital signs at 1:20 PM 146/96, pulse 62. Pulse Ox 99%. Gave patient first dose 28 mg nasal spray, each nasal spray administered in each nostril as directed and waited 5 minutes between the second and third dose. All 3 doses given pt did not complain of any nausea/vomiting, given a cup of water due to the taste after the administration of Spravato.  She listens to [employer] with spa or relaxing music. pt appears very sedate today, checked 40 minute vitals at 2:05 PM, 99/70, pulse 52, Pulse Ox 92%. Explained she would be monitored for a total time of 120 minutes, but pt was very drowsy and sedate today so I let her stay in the recliner another 30-40 minutes before she left with her ride. Discharge vitals were taken at 3:10 PM 90/66 P 86, 99% Pulse Ox. Dr. Jennelle Human met with pt today and discussed her medication. I walked pt to elevator, where her husband met her for the ride home. Recommend she go home and sleep or just relax on the couch. No  driving, no intense activities. Verbalized understanding. Nurse was with pt a total of 70 minutes for clinical. Pt will be scheduled next Monday, May 6th. Pt instructed to call office with any problems or questions.      LOT [id number] EXP OCT 2026

## 2023-03-11 NOTE — Progress Notes (Signed)
Laura Chang [id number] 1956/02/01 67 y.o.  Subjective:   Patient ID:  Laura Chang is a 67 y.o. (DOB 02/18/1956) female.  Chief Complaint:  Chief Complaint  Patient presents with   Follow-up   Depression   Anxiety   ADD   Sleeping Problem      Laura Chang presents to the office today for follow-up of depression and anxiety and ADD.  seen November 12, 2019.  Melted down in 2020.  Went to [facility] in July.  No withdrawal.  1 drink since then.  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 She was on no stimulant and no SSRI but was taking Strattera and Wellbutrin.  The following changes were made. Stop Strattera. OK restart stimulant bc severe ADD Restart Adderall 1 daily for a few days and if tolerated then restart 1 twice daily. If not tolerated reduce the dosage if needed. May need to stop Wellbutrin if not tolerating the stimulant.  Yes.  DC Wellbutrin Restart Prozac 20 mg daily.  February 2021 appointment with the following noted: Completed grant proposal.  Couldn't doit without Adderall.  Sold a bunch of work.   Adderall XR lasts about 3 pm.  Strength seems about right.  BP been OK.  Not jittery.   Stopped Wellbutrin but had no SE. Mood drastically better with grant proposal and back on fluoxetine.  Less depressed and lethargic.  No anxiety.  Cut back on coffee. Started back with devotions and stronger faith. Plan: Continue Prozac 20 mg daily. May have to increase the dose at some point in the future given that she usually was taking higher dosages but she is getting good response at this time. Restart Wellbutrin off label for ADD since can't get 2 ADDERALL daily. 150 mg daily then 300 mg daily. She can adjust the dose between 150 mg and 300 mg daily to get the optimal effect.   05/11/2020 appointment with the following noted: Has been inconsistent with Prozac and Wellbutrin. Not sure of the effect of Wellbutrin. Biggest deterrent in  work is anxiety.  Some of the work is conceptual and difficult at times.  Can feel she's not up to a project at times.  Overall is OK but would like a steadier benefit from stimulant.  Exhausted from managing concentration and keeping up with things from the day.  Loses things.  Not good keeping up with schedule. Overall productive and emotionally OK. Can feel Adderall wear off. Mood is better in summer and worse in the winter.   F died in 10/10/2023 and that is a loss. No SE Wellbutrin. Still attends AA meetings.  Real benefit from [facility] last year. Recognizes effect of anemia on ADD and mood.  Had iron infusions last winter. Plan:  Wellbutrin off label for ADD since can't get 2 ADDERALL daily. 150 mg daily then 300 mg daily.  01/24/2021 appointment with following noted: Doing a program called [employer] mindfulness app since Xmas.  CBT app helped the depression.  App helped her focus better.  Lost sign weight. Writing a lot. Before Xmas felt depressed and started negative thinking worse, self denigrating. Not drinking. More isolated.   Recognizes mo is narcissist.    Didn't tell anyone she was born until 3 mos later.  M aloof and uninterested in pt.  Lied about her birthday.  Mo lack of affection even with pt's kids. Going to AA for a year and it helped her to quit drinking. Also  misses kids being gone with a hole also. Plan: No med changes  05/04/2021 appointment with the following noted: Therapist [name] thinks she's manic. Lost weight to 144#.   States she is still sleeping okay.  Admits she is hyper and recognizes that she is likely manic.  She feels great, euphoric with an increased sense of spiritual connectedness to God.  She has racing thoughts and talks fast and talks a lot and this is noted by her husband.  He thinks she is a bit hyper.  She has been able to maintain sobriety although she will have 1 glass of wine on special occasions but does not drink by herself.  She  is not drinking to excess.  She denies any dangerous impulsivity.  She is clearly not depressed and not particularly anxious.  She has no concerns about her medication and she has been compliant.  06/16/21 appt noted: So much better.  Going through a lot but the manic thing happened on top of it.  So much slower.  Didn't feel like losing anything with risperidone.  Likes the Adderalll at 10 mg. Some drowsiness in the AM and very drowsy from risperidone 2 mg HS. Prayer life is better. Handling stress better. Less depressed with risperidone. Still likes trazodone. Sleeps well. Plan: Reduce Prozac to 10 mg daily.  Consider stopping it because it can feel the mania however she is reluctant to do that because she fears relapse of depression. Reduce risperidone to 1.5 mg nightly due to side effects.  Discussed risk of worsening mania.  07/25/2021 appointment with the following noted: Misses the Adderall and hard to function without it. Depressed now. Heavy chest.  Anxious and guilty.  Body feels heavy.   Hates Wellbutrin.   Plan: Increase fluoxetine to 20 mg daily Add Abilify 1/2 of 15 mg tablet daily Wean wellbutrin by 1 tablet each week  bc she feels it is not helpful and DT polypharmacy Reduce risperidone to 1 daily for 1 week and stop it. Disc risk of mania. Increase Adderall to XR 20 mg AM  08/08/21 Much less depressed and starting to feel normal I feel a lot better. No SE.  Speech normal off risperidone. Sleeping OK on trazaodone and enough.   Noticed benefit from Adderall again. Plan: continue fluoxetine to 20 mg daily Continue Abilify 1/2 of 15 mg tablet daily for depression and mania continue Adderall to XR 20 mg AM  10/10/2021 phone call: Pt stated she feels like the Abilify should be decreased to 68m.She said she is depressed but rational and not suicidal.She has an appt Monday and can wait until then if you prefer. MD response: Reduce the Abilify to 7.5 mg every other day.  We will  meet on 10/16/2021 and decide what to do from there.  10/16/2021 appointment with the following noted: More depressed.  Most depressed I've ever been.  Just numb.  Sense of grief.   Thinks the manic episode was unlike anything else she ever had.  Doesn't want to medicate against it.  Don't enjoy people.  Easily overwhelmed.  Had some death thoughts but not suicidal.  Has been functional.  Feels better today after reducing Abilify to every other day but she is only been doing that for 3 days. A/P: Episode of post manic depression was explained. continue fluoxetine to 20 mg daily Hold Abilify for 1 week then resume Abilify 1/2 of 15 mg tablet every other day for depression and mania continue Adderall to XR 20  mg AM  10/27/2021 appointment with the following noted: I'm doing so much better.  Handling the depression better. Better self talk and spiritual focus has helped.   Dep 6/10 manifesting as anxiety with low confidence.   F died 2  years ago and M 67 yo and is dependent . She is working hard to feel better but still feels depressed.  She almost feels like she has a little more anxiety since restarting Abilify every other day. Plan: continue fluoxetine to 20 mg daily DC Abilify .  Vrayalar 1.5 mg QOD to try to get rid of depression ASAP. continue Adderall to XR 20 mg AM  11/10/2021 appointment with the following noted: Busy with Xmas and it was fun with family but then a big let down.  Did well with it.  Functioned well with it.  Working hard on things with depression.  Not shutting down. Not sure but feels better today but yesterday was hard.  Difficulty dealing with mother.  She won't do anything to help herself.  Yesterday with her all day.  Won't do PT and has isolated herself.    Lack of confidence.   No SE with Vraylar.  11/24/21 urgent appointment appt noted: More and more depressed.   So anxious and doesn't want to be alone but can do so. No appetite. Hurts inside. Has had some  fleeting suicidal thoughts but would not act on them.  Tolerating meds. Has been consistent with Vraylar 1.5 mg every other day, fluoxetine 20 mg daily Plan: Increase Vraylar to 1.5 mg daily Change Prozac to Trintellix 10 mg daily. Discussed side effects of each continue Adderall to XR 20 mg AM  12/27/2021 appointment with the following noted: Not OK.  I feel less depressed but feels bat shit. Not sleeping well.  Extremely anxious. Off and on sleep. 3-4 hours of sleep.   Still having daily SI.  But also become obvious has so much to do.  Overwhelmed by tasks.   Needs anxiety meds to just function. Not more motivated.  Walked yesterday.   Feels afraid like in trouble but not irritable or angry. DC DT agitation Vraylar to 1.5 mg daily Change Prozac to Trintellix 10 mg daily. Hold Adderall to XR 20 mg AM Clonidine 0.1 1/2 tablet twice daily for 2 days and if needed for anxiety and sleep increase to 1 twice daily Ok temporary Ativan 1 mg 3 times daily as needed anxiety  01/05/22 appt noted: Off fluoxetine and  Trintellix.  Only on Ativan, trazodone and Adderall XR 20 plus added clonidine 0.1 mg BID Didn't think she needed to start Trintellix. Not taking Ativan.   Didn't like herself last week. Feels some better today. Wonders if the manic sx Not agitated.  Anxiety kind of calmed down.  A lot to be anxious about situationally.  $ stress. Concerns about downers with meds. Can't access normal personality. ? Lethargy and inability to talk as sE. Plan: Latuda 20-40 mg daily with food. Adderall to XR 20 mg AM Clonidine 0.1 1/2 tablet twice daily  reduce dose to be sure no SE Ok temporary Ativan 1 mg 3 times daily as needed anxiety  01/19/22 appt noted: Taking Latuda 20 mg daily.  Took 40 mg once and felt anxious and  SI Still depressed and not very reactive Anxiety mainly about the depression and fears of the future. She wants to revisit manic sx and thinks it was maybe bc taking delta 8  bc was taking a lot  of it so still doesn't think she's classic bipolar. She wants to only take Prozac bc thinks Latuda is perpetuating depression. Says the delta 8 was very psychaedelic.  When not taking it was not manic.  Sleeping ok again.  Plan: Per her request DC Latuda 20-40 mg daily with food. She wants to continue Prozac alone AMA  Adderall to XR 20 mg AM Clonidine 0.1 1/2 tablet twice daily  reduce dose to be sure no SE Ok temporary Ativan 1 mg 3 times daily as needed anxiety  01/23/2022 phone call complaining of increased anxiety since stopping Latuda.  She will try increasing clonidine.  01/26/2022 phone call not feeling well and wanted to restart the Vraylar.  However notes indicate that had made her agitated therefore she was encouraged to pick up samples of Rexulti 1 mg and start that instead.  02/06/2022 phone call: Stating she felt the Rexulti was helping with depression but she was not sleeping well and obsessing over things.  She was encouraged to increase Rexulti to 2 mg daily and increase trazodone for sleep.  02/09/2022 appointment with the following noted: This was an urgent work in appointment No sleep last night with trazodone 100 mg HS Nothing really better depression or anxiety. Ruminating negative anxious thoughts. Did not tolerate Rexulti because it was causing insomnia.  Does not think it helped depression.  Lacks emotion that she should have.  Lacks her usual personality.  Some hopeless thoughts.  Some death thoughts.  Some suicidal thoughts without plan or intent Plan: DC Rexulti and Prozac & DC trazodone Adderall to XR 20 mg AM Clonidine 0.1 1/tablet twice daily  reduce dose to be sure no SE Ok temporary Ativan 1 mg 3 times daily as needed anxiety Start Seroquel XR 150 mg nightly  03/02/2022 appointment: Angelique Blonder called back a few days after starting Seroquel stating it was making her more anxious and more depressed.  This seemed unlikely as this medicine rarely  ever causes anxiety.  She stopped the medication waited 3 days and called back still had anxiety and depression but thought perhaps the anxiety was a little better.  She did not want to take the Seroquel. She knew about the option of Spravato and wanted to pursue that. Now questions whether to return to Seroquel while waiting to start Spravato bc feels just as bad without it and knows she didn't give it enough time to work.   MADRS 46  ECT-MADRS    Flowsheet Row Clinical Support from 01/08/2023 in [facility] Clinical Support from 08/06/2022 in [facility] Clinical Support from 07/04/2022 in [facility] Clinical Support from 05/21/2022 in [facility] Office Visit from 03/02/2022 in [facility]  MADRS Total Score 21 29 15 27  46      03/14/22 appt noted: Pt received Spravato 56 mg first dose today with some dissociative sx which were not severe.  She was anxious prior to the administration and felt better after receiving lorazepam 1 mg.  No NV, or HA. Wants to continue Spravato. Ongoing depression and desperate to feel better.  I'm not myself DT deprsssion which is most severe in recent history.  Anhedonia.  Low motivation.  Social avoidance. Continues to think all recent med trials are making her worse.  Sleep ok with Seroquel.  03/16/22 appt noted: Received Spravato 84 mg for the first time.  some dissociative sx which were not severe.  She  was anxious prior to the administration and felt better after receiving lorazepam 1 mg.  No NV, or HA. Wants to continue Spravato.   Does not feel any better or different since the last appt.  Ongoing depression.  Ongoing depression and desperate to feel better.  I'm not myself DT deprsssion which is most severe in recent history.  Anhedonia.  Low motivation.  Social avoidance. Continues to think all recent med trials are  making her worse.  Sleep ok with Seroquel.  Does not want to continue Seroquel for TRD.  03/20/2022 appointment noted: Came for Spravato administration today.  However blood pressure was significantly elevated approximately 180/115.  She was given lorazepam 1 mg and clonidine 0.2 mg to try to get it down. She states she regretted stopping the Seroquel XR 300 mg tablets.  She now realizes it was helpful.  She did not sleep much at all last night.  She did not take the Adderall this morning. 2 to 3 hours after arrival blood pressure was still elevated at  170/110, 62 pulse.  For Spravato administration was canceled for today.  She admits to being anxious and depressed.  She is not suicidal.  She is highly motivated to receive the Spravato.  We discussed getting it tomorrow.  03/22/2022 appointment noted: Patient's blood pressure was never stable enough yesterday in order to get her in for Spravato administration.  She was encouraged to see her primary care doctor.  It is better today.  03/26/2022 appointment with the following noted: Blood pressure was better.  Saw her primary care doctor who started on oral Bystolic 2.5 mg daily. Received Spravato 84 mg today as scheduled.  Tolerated it well without nausea or vomiting headache or chest pain or palpitations.  Her blood pressure was borderline but manageable. She remains depressed and anxious.  She is ambivalent about the medicine and desperate to get to feel better.  Continues to have anhedonia and low energy and low motivation and reduced ability to do things.  Less social.  Not suicidal.  03/28/22 appt noted: Received Spravato 84 mg today as scheduled.  Tolerated it well without nausea or vomiting headache or chest pain or palpitations.  Her blood pressure was borderline but manageable. Has not seen any improvement so far.  Tolerating Seroquel.  Inconsistent with Bystolic and BP has been borderline high. Still depressed and anxious and anhedonia.  Low  motivation, energy, productivity. Taking quetiapine and tolerating XR 300 mg nightly.  04/04/22 appt noted: Received Spravato 84 mg today as scheduled.  Tolerated it well without nausea or vomiting headache or chest pain or palpitations.  Her blood pressure was borderline but manageable. Has not seen any improvement so far.  Tolerating Seroquel.   She still tends to think that the medications are making her worse.  She has said this about each of the recent psychiatric medicines including Seroquel.  However her husband thinks she is improved.  She also admits there is some improvement in productivity.  She still feels highly anxious.  She still does not enjoy things as normal.  She still feels desperate to improve as soon as possible. Has been taking Seroquel XR since 03/20/2022  04/10/22 appt noted: Received Spravato 84 mg today as scheduled.  Tolerated it well without nausea or vomiting headache or chest pain or palpitations.  Her blood pressure was borderline but manageable. Has not seen any improvement so far.  Tolerating Seroquel.  Doesn't like Seroquel bc she thinks it flattens here. 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  Tolerated it well without nausea or vomiting headache or chest pain or palpitations.  Her blood pressure was borderline but manageable. Has not seen any improvement so far.  Tolerating Seroquel.  Doesn't like Seroquel bc she thinks it flattens her. Received Spravato 84 mg today as scheduled.  Tolerated it well without nausea or vomiting headache or chest pain or palpitations.  Her blood pressure was borderline but manageable. Has not seen any improvement so far.  Tolerating Seroquel.  Doesn't like Seroquel bc she thinks it flattens here.  We discussed her ambivalence about it. She is starting Auvelity  and has tolerated it the last 2 days without side effect.  She still does not feel like herself and feels flat and not enjoying things with suppressed expressed emotion  04/17/2022 appointment with the following noted: Received Spravato 84 mg today as scheduled.  Tolerated it well without nausea or vomiting headache or chest pain or palpitations.  Her blood pressure was borderline but manageable. Has not seen any improvement so far.  Tolerating Seroquel.  Doesn't like Seroquel bc she thinks it flattens her. She has been tolerating the Auvelity 1 in the morning without side effects for about a week.  She has not noticed significant improvement so far.  She still feels depressed and flat and not herself.  Other people notice that she is flat emotionally.  She is not suicidal.  She does feel discouraged that she is not getting better yet.  04/19/2022 appointment noted: Has increased Auvelity to 1 twice daily for 2 days, continues quetiapine XR 300 mg nightly, clonidine 0.3 mg twice daily, lorazepam 1 mg twice daily for anxiety and Adderall XR 20 mg in the morning. No obious SE but she still thinks quetiapine XR is making her feel down.  But not sedated Received Spravato 84 mg today as scheduled.  Tolerated it well without nausea or vomiting headache or chest pain or palpitations.  Her blood pressure was borderline but manageable. She still feels quite anxious and feels it necessary to take both the clonidine and lorazepam twice a day to manage her anxiety.  She has been consistently down and flat and not herself until yesterday afternoon she noted an improvement in mood and feeling much more like herself with her normal personality reemerging.  She was quite depressed in the morning with very dark negative thoughts.  She did not have those dark negative thoughts this morning.  She had a lot of questions about medication and when she was expecting to be improved and why she has not shown improvement up to  now.  04/23/22 appt noted: Has increased Auvelity to 1 twice daily for 1 week, continues quetiapine XR 300 mg nightly, clonidine 0.3 mg twice daily, lorazepam 1 mg twice daily for anxiety and Adderall XR 20 mg in the morning. No obious SE but she still thinks quetiapine XR is making her feel down.  But not sedated Received Spravato 84 mg today as scheduled.  Tolerated it well without nausea or vomiting headache or chest pain or palpitations.  She is still depressed but admits better function and is able to enjoy social interactions. Tolerating meds.  Would like to feel better for sure. Not herself.  Flat. Plan increase Auvelity to 1 tab BID as planned and reduce Quetiapine to 1/2 of ER 300 mg  bc  NR for depression.  04/25/2022 appointment with the following noted: clonidine 0.3 mg twice daily, lorazepam 1 mg twice daily for anxiety and Adderall XR 20 mg in the morning. Seroquel XR 300 HS No obious SE but she still thinks quetiapine XR is making her feel down.  But not sedated Received Spravato 84 mg today as scheduled.  Tolerated it well without nausea or vomiting headache or chest pain or palpitations.  Called yesterday with more anxiety.  Had increased Auvelity for 1 day and reduced Seroquel XR for 1 day.  Felt restless and fearful  05/01/2022 appointment noted: clonidine 0.3 mg twice daily, lorazepam 1 mg twice daily for anxiety and Adderall XR 20 mg in the morning. Seroquel XR 150 HS, Auvelity 1 BID Received Spravato 84 mg today as scheduled.  Tolerated it well without nausea or vomiting headache or chest pain or palpitations.  Nurse has noted patient has called multiple times sometimes asking the same question repeatedly.  It is unclear whether she is truly forgetful or is just anxious seeking reassurance. Patient acknowledges ongoing depression as well as some anxiety but states she has felt a little better in the last couple of days.  She has reduced the Seroquel to 150 mg at night and has  increased Auvelity to 1 twice daily but only for 1 day.  So far she seems to be tolerating it.  05/03/22 appt noted: clonidine 0.2 mg twice daily, lorazepam 1 mg twice daily for anxiety and Adderall XR 20 mg in the morning. Seroquel XR 150 HS, Auvelity 1 BID BP high this am about 170/100 and received extra clonidine 0.2 mg and came to receive Spravato.  Not dizzy, no SOB, nor CP but BP is still high Could not receive Spravato today bc BP high and pulse low at 30 ppm. Still depressed and anxious. Plan: continue trial Auvelity with Spravato She needs to get BP and pulse managed  05/08/22 TC: RTC  H Michael NA and mailbox full.  Could not leave message.  Pt  -  talked to she and H on speaker. H worried over wife.  Vacant stare.  Slurs words at times.  Not smiling. Reduced enjoyment.  Depression.  Withdrawn from usual activities.  Some irritability.  Anxious. Disc her concerns meds are making her worse.  Extensive discussion about her treatment resistant status.  There is a consistent pattern of not taking the medicines long enough to get benefit because she believes the meds are making her worse.  However the symptoms she describes as side effects are exactly the same symptoms that she had prior to taking the medication RX for  the depression.  So it is not clear that these are actual side effects. This is true about the 2 most recent meds including Seroquel and Auvelity.  Recommend psychiatric consultation in hopes of improving her comfort level with taking prescribed medications for a sufficient length of time to provide benefit. Extensive discussion about ECT is the treatment of choice for treatment resistant depression.  Spravato may work if she can comply with consistency.  There are medication options but they take longer to work.   Plan:  Reduce clonidine to 0.1 mg BID DT bradycardia.  Talk with PCP about BP and low pulse problems which are interfering with her consistent compliance with  Spravato.   Limit lorazepam to 3 -4  mg daily max. Excess use is the cause of slurring speech.  She must stop excess use or will have to stop the  med. Stop Auvelity per her request.  But she has only been on the full dose for a little over a week and clearly has not had time to get benefit from it.  She thinks maybe it is making her more anxious. Reduce Seroquel from 150XR to 50 -100 mg at night IR.  She couldn't sleep when stopped it completely. Will not start new antidepressant until her SE issues are resolved or not. Get second psych opinion from Yehuda Budd MD or another psychiatrist.  H's sister is therapist in [place] Hoyer, MD, [facility]  05/16/2022 appointment with the following noted: Received Spravato 84 mg today as scheduled.  Tolerated it well without nausea or vomiting headache or chest pain or palpitations.  She stopped Auvelity as discussed last week. On her own, without physician input, she restarted Wellbutrin XL 450 mg every morning today.  She had taken it in the past.  She feels jittery and anxious. She feels less depressed than she did last week.  But she is still depressed without her usual range of affect.  She still is less social and less motivated than normal. Her primary care doctor increased the dose of losartan Plan: Stop Seroquel Reduce Wellbutrin XL to 300 mg every morning.  Starting the dose at 450 every morning is likely causing side effects of jitteriness and it should not be started at that have a dosage. Recommend she not change meds on her own without MDM put  05/23/2022 appointment with the following noted: Received Spravato 84 mg today as scheduled.  Tolerated it well without nausea or vomiting headache or chest pain or palpitations.  Has not dropped seroquel XR 300 mg 1/2 tablet nightly bc couldn't sleep without it. Has not tried lower dose quetiapine 50 mg HS Still feels depressed.   BP is better managed so far, just saw PCP.  BP is better today and  infact is low today. Dropped clonidine as directed from 0.3 mg BID bc inadequate control of BP to 0.2 mg BID.  However she wants to increase it back to 0.3 mg twice daily because she feels it helped her anxiety better.  Wonders about increasing Wellbutrin for depression.  However she has only been on 300 mg a day for a week.  She was on 450 mg daily in the past.  06/06/22 appt noted: Received Spravato 84 mg today as scheduled.  Tolerated it well without nausea or vomiting headache or chest pain or palpitations.  She is still depressed and anxious.  She wants to try to stop the Seroquel but cannot sleep without some of it.  She is taking lorazepam 1 mg 4 times daily and still having a lot of anxiety.  She wants to increase clonidine back to 0.3 mg twice daily.  She hopes for more improvement She recently went for a second psychiatric opinion as suggested the results of that are pending.  06/11/22 appt noted: Received Spravato 84 mg today as scheduled.  Tolerated it well without nausea or vomiting headache or chest pain or palpitations.  She is still depressed and anxious. Without much change.  Still hopeless, anhedonia, reduced inteterest and motivation.  Tolerating meds. Disc concerns Spravato is not hleping much. Plan: stop Seroquel and start olanzapine 10 mg HS for TRD and anxiety.  06/13/2022 appointment noted: Received Spravato 84 mg today as scheduled.  Tolerated it well without nausea or vomiting headache or chest pain or palpitations.  She is still depressed and anxious. Without much change.  Still  hopeless, anhedonia, reduced inteterest and motivation.  Tolerating meds. Disc concerns Spravato is not helping much as hoped but is improving a bit in the last week. Tolerating meds. Continues Wellbutrin XL 450 AM, tolerating recently started olanzapine  10 mg HS. Sleep is good.   Pending appt with [place] consult.  06/18/22 appt noted: Received Spravato 84 mg today as scheduled.  Tolerated it well  without nausea or vomiting headache or chest pain or palpitations.  Tolerating meds. Continues Wellbutrin XL 450 AM, tolerating recently started olanzapine  10 mg HS. Continues Adderall XR 20 amd and has tried to reduce lorazepam to '1mg'$  TID Sleep is good.   Pending appt with [facility] consult. Depression is a little bit better in the last week with a little improvement in emotional expression and interest.  She is pushing herself to be more active.  Her daughter thought she was a little better than she has been.  However she is still depressed and still not her normal self with anhedonia and reduced emotional expressiveness.  06/20/22 appt noted: Received Spravato 84 mg today as scheduled.  Tolerated it well without nausea or vomiting headache or chest pain or palpitations.  Tolerating meds with a little sleepiness. Continues Wellbutrin XL 450 AM, tolerating recently started olanzapine  10 mg HS. Continues Adderall XR 20 amd and has tried to reduce lorazepam to '1mg'$  TID Sleep is good.   Mood is improving.  Better funciton.  Anxiety is better with olanzapine. Still not herself and depression not gone with some anhedonia and social avoidance and feeling overwhelmed.  8/14 2023 received Spravato 84 mg 06/27/2022 received Spravato Spravato 84 mg 07/02/2022 received Spravato 84 mg 07/04/2022 received Spravato 84 mg  07/09/2022 appointment noted: Received Spravato 84 mg today as scheduled.  Tolerated it well without nausea or vomiting headache or chest pain or palpitations.  Expected dissociation and feels less depressed with resolution of negative emotions immediately after Spravato and then depression, anxiety creep back in. Continues meds Adderall XR 20 mg every morning, Wellbutrin XL 450 every morning, clonidine 0.1 mg twice daily, lorazepam 1 mg every 6 hours as needed, olanzapine increased from 7.5 to 10 mg nightly on  Tolerating meds.  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  She does note some increased appetite and is somewhat concerned about that but has not gained significant amounts of weight. She has had the [place] consultation which was initially denied but she knows it can be appealed.  However because she is improving with Spravato plus the other medications now she wants to continue the current treatment plan.  07/18/22 appt noted: Continues meds Adderall XR 20 mg every morning, Wellbutrin XL 450 every morning, clonidine 0.1 mg twice daily, lorazepam 1 mg every 6 hours as needed, olanzapine increased from 7.5 to 10 mg nightly on 07/04/2022. Received Spravato 84 mg today as scheduled.  Tolerated it well without nausea or vomiting headache or chest pain or palpitations.  Expected dissociation and feels less depressed with resolution of negative emotions immediately after Spravato and then depression, anxiety creep back in. Continues meds Adderall XR 20 mg every morning, Wellbutrin XL 450 every morning, clonidine 0.1 mg twice daily, lorazepam 1 mg every 6 hours as needed, olanzapine increased from 7.5 to 10 mg nightly on  Tolerating meds.  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  She does note some increased appetite and is somewhat concerned about that but has not gained significant amounts of weight. She has had the [place] consultation which was initially denied but she knows it can be appealed. She continues to have chronic ambivalence about psychiatric medicines and initially tends to blame her depressive symptoms such as decreased concentration and feeling flat on what ever medicine she currently is taking even though she had the same symptoms before the current medicines were started.  Then after discussion she does admit that her depressive  symptoms are improved since adding olanzapine but still has those residual symptoms noted.  07/23/22 received Spravato 84 mg   07/30/2022 appointment noted: Received Spravato 84 mg today as scheduled.  Tolerated it well without nausea or vomiting headache or chest pain or palpitations.  Expected dissociation and feels less depressed with resolution of negative emotions immediately after Spravato and then depression, anxiety creep back in. Continues meds Adderall XR 20 mg every morning, Wellbutrin XL 450 every morning, clonidine 0.1 mg twice daily, lorazepam 1 mg every 6 hours as needed, olanzapine increased from 7.5 to 10 mg nightly on  She has been inconsistent with olanzapine because she continues to be ambivalent about the medications in general and thinks that perhaps the 10 mg is making her feel blunted.  She continues to feel some depression.  She had a good day this week and but still feels somewhat depressed and persistently anxious. Plan: be consistent with olanzapine 10 mg HS for TRD and longer trial for potential benefit for anxiety.  Has not taken it consistently.  08/06/2022 appointment noted: Received Spravato 84 mg today as scheduled.  Tolerated it well without nausea or vomiting headache or chest pain or palpitations.  Expected dissociation and feels less depressed with resolution of negative emotions immediately after Spravato and then depression, anxiety creep back in. Continues meds Adderall XR 20 mg every morning, Wellbutrin XL 450 every morning, clonidine 0.1 mg twice daily, lorazepam 1 mg every 6 hours as needed, olanzapine i 10 mg nightly  She continues to feel depressed but is about 50% better with Spravato.  She is still not herself.  She still has anhedonia.  She still is not her able to engage socially in the typical ways.  She is not jovial and outgoing like normal.  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  She continues to feel that her personality is dampened down.  There is a question about whether it is related to depression or medication. Plan: continue olanzapine 10 for longer trial for TRD and severe anxiety.  08/13/22 appt noted:  Received Spravato 84 mg today as scheduled.  Tolerated it well without nausea or vomiting headache or chest pain or palpitations.  Expected dissociation and feels less depressed with resolution of negative emotions immediately after Spravato and then depression, anxiety creep back in. Continues meds Adderall XR 20 mg every morning, Wellbutrin XL 450 every morning, clonidine 0.1 mg twice daily, lorazepam 1 mg every 6 hours as needed, olanzapine i 10 mg nightly  She still does not feel herself.  Still struggles with depression and low motivation and reduced social engagement and reduced interest and reduced emotional expression.  She is somewhat better with the medicines plus Spravato.  She still believes the [employer]  makes her blunted and is not sure how much it helps her anxiety.  She can have good days when her family is around and she is engaged.  She still wants to stop the olanzapine. She has apparently continued to take the trazodone despite having been told to stop it when she started olanzapine.  She feels like she needs the trazodone. Plan: DC olanzapine and Start nortriptyline 25 mg nightly and build up to 75 mg nightly and then check blood level.    08/27/2022 appointment noted: Received Spravato 84 mg today as scheduled.  Tolerated it well without nausea or vomiting headache or chest pain or palpitations.  Expected dissociation and feels less depressed with resolution of negative emotions immediately after Spravato and then depression, anxiety creep back in. Continues meds Adderall XR 20 mg every morning, Wellbutrin XL 450 every morning, clonidine 0.1 mg twice daily, lorazepam 1 mg every 6 hours as needed. Stopped  olanzapine and started nortriptyline which she has taken for about a week is 75 mg nightly. So far she is tolerating the nortriptyline well with the exception of some dry mouth and constipation which she is working to manage.  She does not feel substantially better better or different off the olanzapine.  No change in her sleep which is good.  Main concern currently in addition to the residual depression is anxiety which is somewhat situational with pending arch show.  She is worrying about it more than normal.  Says she is having to take lorazepam twice a day where she had been able to keep reduce it prior to this.  She still does not feel like herself with residual depression with less social interest and less of her usual buoyancy in personality.  She is flatter than normal.  Overall she still feels that the Spravato has been helpful at reducing the severity of the depression.  She is not suicidal. She has not heard anything about the [place] appeal as of yet.  09/05/2022 appointment noted: Received Spravato 84 mg today as scheduled.  Tolerated it well without nausea or vomiting headache or chest pain or palpitations.  Expected dissociation and feels less depressed with resolution of negative emotions immediately after Spravato and then depression, anxiety creep back in. Continues meds Adderall XR 20 mg every morning, Wellbutrin XL 450 every morning, clonidine 0.1 mg twice daily, lorazepam 1 mg every 6 hours as needed. Stopped olanzapine and started nortriptyline which she has taken for about 2 week is 75 mg nightly. Initially blood pressure was a little high causing delay in starting Spravato.  She admitted to feeling a little wound up.  She still experiences a little increase in depression if she goes longer than a week in between doses of Spravato.  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  09/10/22 appt noted: Received Spravato 84 mg today as scheduled.  Tolerated it well without nausea or vomiting headache or chest pain or palpitations.  Expected dissociation gradually resolved over the 2 hour observation period. She feels 50% less depressed with Spravto and wants to continue it.   Continues meds Adderall XR 20 mg every morning, Wellbutrin XL 450 every morning, clonidine 0.1 mg twice daily, lorazepam 1 mg every 6 hours as needed. Has started nortriptyline 75 mg nightly for about 3 weeks. Has not seen a significant difference with the addition of nortriptyline.  Tolerating it  pretty well. She continues to have some degree of anhedonia and significant depression and anxiety.  Her daughters noticed that she is more needy and calls more frequently.  She acknowledges this as well.  She is clearly still not herself. Plan: pramipexole off label and RX 0.25 mg BID  09/17/2022 appointment noted: Received Spravato 84 mg today as scheduled.  Tolerated it well without nausea or vomiting headache or chest pain or palpitations.  Expected dissociation gradually resolved over the 2 hour observation period. She feels 50% less depressed with Spravto and wants to continue it.   Continues meds Adderall XR 20 mg every morning, Wellbutrin XL 450 every morning, clonidine 0.1 mg twice daily, lorazepam 1 mg every 6 hours as needed. Has started nortriptyline 75 mg nightly for about 3 weeks and DT level 176, reduced to 50 mg HS early November. Still the same sx as noted last visit.  Tolerating meds.   Compliant.  Still depressed and family notices.  Has been able to participate in family interactions.  Some post-show let down and has to do detailed work which is hard for her bc ADD.  Sleep and eating well.  Energy OK but not great.  No SI.  Not cried in a year or so.  Clearly less depressed and hopeless than before the Spravato.  09/24/22 appt noted: Received Spravato 84 mg today as scheduled.   Tolerated it well without nausea or vomiting headache or chest pain or palpitations.  Expected dissociation gradually resolved over the 2 hour observation period. She feels 50% less depressed with Spravto and wants to continue it.   Continues meds Adderall XR 20 mg every morning, Wellbutrin XL 450 every morning, clonidine 0.1 mg twice daily, lorazepam 1 mg every 6 hours as needed. Has started nortriptyline 75 mg nightly for about 3 weeks and DT level 176, reduced to 50 mg HS early November. Still the same sx as noted last visit.  Tolerating meds.   Compliant.  Still depressed and family notices.  Has been able to participate in family interactions.  Some post-show let down and has to do detailed work which is hard for her bc ADD.  Sleep and eating well.  Energy OK but not great.  No SI.  Not cried in a year or so.  Clearly less depressed and hopeless than before the Spravato. Is not making further progress generally.  Stuck with moderate depression  10/02/22 appt noted: Received Spravato 84 mg today as scheduled.  Tolerated it well without nausea or vomiting headache or chest pain or palpitations.  Expected dissociation gradually resolved over the 2 hour observation period. She feels 50% less depressed with Spravto and wants to continue it.   Continues meds Adderall XR 20 mg every morning, Wellbutrin XL 450 every morning, clonidine 0.1 mg twice daily, lorazepam 1 mg every 6 hours as needed. Has started nortriptyline 75 mg nightly for about 3 weeks and DT level 176, reduced to 50 mg HS early November. Still the same sx as noted last visit.  Tolerating meds.   Compliant.  Still depressed and family notices.  Has been able to participate in family interactions.  Some post-show let down and has to do detailed work which is hard for her bc ADD.  Sleep and eating well.  Energy OK but not great.  No SI.  Not cried in a year or so.  Clearly less depressed and hopeless than before the Spravato. Is not making  further progress generally.  Stuck with  moderate depression.  Is able to function pretty normally. Plan: trial pramipexole 0.25 mg BID off label for depression.   10/08/22 appt noted: Received Spravato 84 mg today as scheduled.  Tolerated it well without nausea or vomiting headache or chest pain or palpitations.  Expected dissociation gradually resolved over the 2 hour observation period. She feels 50% less depressed with Spravto and wants to continue it.   Continues meds Adderall XR 20 mg every morning, Wellbutrin XL 450 every morning, clonidine 0.1 mg twice daily, lorazepam 1 mg every 6 hours as needed. Has started nortriptyline 75 mg nightly for about 3 weeks and DT level 176, reduced to 50 mg HS early November. Still the same sx as noted last visit.  Tolerating meds.   Compliant.  Still depressed and family notices.  Has been able to participate in family interactions.  Some post-show let down and has to do detailed work which is hard for her bc ADD.  Sleep and eating well.  Energy OK but not great.  No SI.  Not cried in a year or so.  Clearly less depressed and hopeless than before the Spravato. Is not making further progress generally.  Stuck with moderate depression.  Behaved and felt pretty normally with family over for Thanksgiving. Doesn't see benefit or SE with pramipexole but thinks maybe it makes her worse. Plan:  increase pramipexole augmentation off label to 0.5 mg BID  10/15/2022 appointment noted: Received Spravato 84 mg today as scheduled.  Tolerated it well without nausea or vomiting headache or chest pain or palpitations.  Expected dissociation gradually resolved over the 2 hour observation period. She feels 50% less depressed with Spravto and wants to continue it.   Continues meds Adderall XR 20 mg every morning, Wellbutrin XL 450 every morning, clonidine 0.2 mg twice daily, lorazepam 1 mg every 6 hours as needed.  Trazodone 50 mg tablets 1-2 nightly as needed insomnia Has  started nortriptyline 75 mg nightly for about 3 weeks and DT level 176, reduced to 50 mg HS early November. Recommended increase pramipexole 0.5 mg twice daily off label for treatment resistant depression on 10/08/2022. She feels better motivated more active with pramipexole 0.5 mg twice daily.  She still is depressed but it is better.  We discussed the possibility of going up in the dose but did not change it.  10/22/2022 appointment noted: Received Spravato 84 mg today as scheduled.  Tolerated it well without nausea or vomiting headache or chest pain or palpitations.  Expected dissociation gradually resolved over the 2 hour observation period. She feels 50% less depressed with Spravto and wants to continue it.   Continues meds Adderall XR 20 mg every morning, Wellbutrin XL 450 every morning, clonidine 0.2 mg twice daily, lorazepam 1 mg every 8 hours as needed.  Trazodone 50 mg tablets 1-2 nightly as needed insomnia Has started nortriptyline 75 mg nightly for about 3 weeks and DT level 176, reduced to 50 mg HS early November. pramipexole 0.5 mg twice daily off label for treatment resistant depression on 10/08/2022. She is better motivated than she was.  She is journaling 3 pages a day.  She has started walking and has walked 5 days a week for 50 minutes for the last 2 weeks.  That is significantly helped her mood.  Her mood tends to be better when she interacts with family.  However she still has some periods of depression.  She is tolerating the medications well.  She still feels like her affect  and confidence is not back to normal. Plan:  increase pramipexole augmentation off label to 0.5 mg tID or 0.75 mg BID   10/29/22 appt noted: Received Spravato 84 mg today as scheduled.  Tolerated it well without nausea or vomiting headache or chest pain or palpitations.  Expected dissociation gradually resolved over the 2 hour observation period. She feels 50% less depressed with Spravto and wants to  continue it.   Continues meds Adderall XR 20 mg every morning, Wellbutrin XL 450 every morning, clonidine 0.2 mg twice daily, lorazepam 1 mg every 8 hours as needed.  Trazodone 50 mg tablets 1-2 nightly as needed insomnia Has started nortriptyline 75 mg nightly for about 3 weeks and DT level 176, reduced to 50 mg HS early November. pramipexole increased to 0.75 mg twice daily off label for treatment resistant depression  She has been feeling somewhat better with the increase in pramipexole and is tolerating it well.  She still is easily overwhelmed.  Her affect and mood can improve now when around her family or doing something positive.  She has been able to be more productive. She is tolerating the current medications. She is still considering TMS as an alternative to Spravato.  11/15/2022 appointment noted: Received Spravato 84 mg today as scheduled.  Tolerated it well without nausea or vomiting headache or chest pain or palpitations.  Expected dissociation gradually resolved over the 2 hour observation period. She feels 50% less depressed with Spravto and wants to continue it.   Continues meds Adderall XR 20 mg every morning, Wellbutrin XL 450 every morning, clonidine 0.2 mg twice daily, lorazepam 1 mg every 8 hours as needed.  Trazodone 50 mg tablets 1-2 nightly as needed insomnia Has started nortriptyline 75 mg nightly for about 3 weeks and DT level 176, reduced to 50 mg HS early November. pramipexole increased to 0.75 mg twice daily off label for treatment resistant depression  He has noticed some increase in depression due to the length of time since the last Spravato administration.  She believes Spravato is helping her.  sHe is not suicidal but has felt very blue the last few days. She is tolerating the medication. She is ambivalent about Spravato versus [place] but she is considering [place]. She wants to continue Spravato administration because it is clearly helpful.  11/19/22 appt noted: Received  Spravato 84 mg today as scheduled.  Tolerated it well without nausea or vomiting headache or chest pain or palpitations.  Expected dissociation gradually resolved over the 2 hour observation period. She feels 50% less depressed with Spravto and wants to continue it.   Continues meds Adderall XR 20 mg every morning, Wellbutrin XL 450 every morning, clonidine 0.2 mg twice daily, lorazepam 1 mg every 8 hours as needed.  Trazodone 50 mg tablets 1-2 nightly as needed insomnia Has started nortriptyline 75 mg nightly for about 3 weeks and DT level 176, reduced to 50 mg HS early November. pramipexole increased to 0.75 mg twice daily off label for treatment resistant depression  She has felt better back on Spravato more regularly.  However she still has residual depression esp when alone or when wihtout activity.  Can function when needed.   Does not have her connfidence back. She plans to pursue [place] availability. Have discussed retrying Auvelity  11/28/22 appt noted: Received Spravato 84 mg today as scheduled.  Tolerated it well without nausea or vomiting headache or chest pain or palpitations.  Expected dissociation gradually resolved over the 2 hour observation period.  She feels 50% less depressed with Spravto and wants to continue it.   Continues meds Adderall XR 20 mg every morning, Wellbutrin XL 450 every morning, clonidine 0.2 mg twice daily, lorazepam 1 mg every 8 hours as needed.  Trazodone 50 mg tablets 1-2 nightly as needed insomnia Has started nortriptyline 75 mg nightly for about 3 weeks and DT level 176, reduced to 50 mg HS early November. pramipexole increased to 0.75 mg twice daily off label for treatment resistant depression  Last week she was more depressed than usual for reasons that are not clear.  She has not had a clear answer from Adrienne Mocha about [facility] options.  States that they have not returned her call.  She is asked about Auvelity retry in place of Wellbutrin.  Has still been  walking.  12/03/22 appt noted: Received Spravato 84 mg today as scheduled.  Tolerated it well without nausea or vomiting headache or chest pain or palpitations.  Expected dissociation gradually resolved over the 2 hour observation period. She feels 50% less depressed with Spravto and wants to continue it.   Continues meds Adderall XR 20 mg every morning, Wellbutrin XL 450 every morning, clonidine 0.2 mg twice daily, lorazepam 1 mg every 8 hours as needed.  Trazodone 50 mg tablets 1-2 nightly as needed insomnia started nortriptyline 75 mg nightly for about 3 weeks and DT level 176, reduced to 50 mg HS early November. pramipexole increased to 0.75 mg twice daily off label for treatment resistant depression  No SE .  Satisfied with meds. Depression is better in the last week.  Not sure why that is the case.  Still walking daily and that helps and journaling 3 pages daily.  Working on [employer], academic.  No changes desire.  Clearly benefits from Spravato but not 100%.  Still considering TMS.  12/10/22 appt noted: Received Spravato 84 mg today as scheduled.  Tolerated it well without nausea or vomiting headache or chest pain or palpitations.  Expected dissociation gradually resolved over the 2 hour observation period. She feels 50% less depressed with Spravto and wants to continue it.   Continues meds Adderall XR 20 mg every morning, Wellbutrin XL 450 every morning, clonidine 0.2 mg twice daily, lorazepam 1 mg every 8 hours as needed.  Trazodone 50 mg tablets 1-2 nightly as needed insomnia started nortriptyline 75 mg nightly for about 3 weeks and DT level 176, reduced to 50 mg HS early November. pramipexole increased to 0.75 mg twice daily off label for treatment resistant depression  No SE .   Depression was worse this week for no apparent reason.  She struggled being positive.  She has felt more discouraged.  She has felt more anxious.  She has had a hard time doing tasks.  She is interested in retrying the  [place] which we had discussed previously. Plan: She agrees to [facility].  To improve tolerability and reduce risk of side effects, Stop Wellbutrin and start Auvelity 1 in the morning for 1 week then 1 twice daily AndReduce pramipexole 0.5 mg BID and reduce nortriptyline to 50 mg HS  12/17/22 appt noted: Received Spravato 84 mg today as scheduled.  Tolerated it well without nausea or vomiting headache or chest pain or palpitations.  Expected dissociation gradually resolved over the 2 hour observation period. She feels 50% less depressed with Spravto and wants to continue it.   Continues meds Adderall XR 20 mg every morning, stopped Wellbutrin XL 150 every morning, clonidine 0.2 mg twice daily,  lorazepam 1 mg every 8 hours as needed.  Trazodone 50 mg tablets 1-2 nightly as needed insomnia Has started nortriptyline 75 mg nightly for about 3 weeks and DT level 176, reduced to 50 mg HS early November. pramipexole 0.375 mg twice daily off label for treatment resistant depression with plan to stop Started Auvelity 1 AM Still depressed to moderate degree.  Tolerating Auvelity so far.  No other problems with meds.  Willing to give Auvelity a chance. Plan: She agrees to [facility].  Continue 1 AM and when tolerated then increase to BID Stop pramipexole.  12/26/22 received Spravato  01/01/23 appt noted: Received Spravato 84 mg today as scheduled.  Tolerated it well without nausea or vomiting headache or chest pain or palpitations.  Expected dissociation gradually resolved over the 2 hour observation period. She feels 50% less depressed with Spravto and wants to continue it.   Continues meds Adderall XR 20 mg every morning, stopped Wellbutrin XL 150 every morning, clonidine 0.2 mg twice daily, lorazepam 1 mg every 8 hours as needed.  Trazodone 50 mg tablets 1-2 nightly as needed insomnia Has started nortriptyline 75 mg nightly for about 3 weeks and DT level 176, reduced to 50 mg HS early  November. Forgot to reduce pramipexole stoll taking  0.5 mg twice daily off label for treatment resistant depression  Started Auvelity 1 AM & PM last week. Occ misses Spravato DT htn.this week a better than last.   Painting again more and it helps mood.  Confidence still low and not as likely to socialize as normal but enjoys family.   Still ambivalent about [place]. Tolerating meds.  Including the increase in [place].    01/08/23 appt noted: Received Spravato 84 mg today as scheduled.  Tolerated it well without nausea or vomiting headache or chest pain or palpitations.  Expected dissociation gradually resolved over the 2 hour observation period. She feels 50% less depressed with Spravto and wants to continue it.   Continues meds Adderall XR 20 mg every morning, stopped Wellbutrin XL 150 every morning, clonidine 0.2 mg twice daily, lorazepam 1 mg every 8 hours as needed.  Trazodone 50 mg tablets 1-2 nightly as needed insomnia Has started nortriptyline 75 mg nightly for about 3 weeks and DT level 176, reduced to 50 mg HS early November. reduced pramipexole to 0.5 mg daily off label for treatment resistant depression  Started Auvelity 1 AM & PM mid Feb. More depressed as week progresses.  Thinks it is worse with less pramipexole.  More negative.  Able to function but feels miserable.  No SI.  Hopeless.  Sleep ok.  Tolerating meds.   Talked to [facility] about [place] and appt mid March. Plan: increase pramipexole to 0.5 mg TID for dep bc seemed to worsen with the reduction.  01/15/23 appt noted: Received Spravato 84 mg today as scheduled.  Tolerated it well without nausea or vomiting headache or chest pain or palpitations.  Expected dissociation gradually resolved over the 2 hour observation period. She feels 50% less depressed with Spravto and wants to continue it.   Continues meds Adderall XR 20 mg every morning, stopped Wellbutrin XL 150 every morning, clonidine 0.2 mg twice daily, lorazepam 1 mg every 8  hours as needed.  Trazodone 50 mg tablets 1-2 nightly as needed insomnia Has started nortriptyline 75 mg nightly for about 3 weeks and DT level 176, reduced to 50 mg HS early November. Increased pramipexole to 0.5 mg  to TID  off label for  treatment resistant depression  Started Auvelity 1 AM & PM mid Feb. markedly better depression the increase in pramipexole.  She feels like her depression is almost resolved.  She is very pleased with the response.  She is tolerating the medications well  01/30/23 appt noted: Received Spravato 84 mg today as scheduled.  Tolerated it well without nausea or vomiting headache or chest pain or palpitations.  Expected dissociation gradually resolved over the 2 hour observation period. She feels 50% less depressed with Spravto and wants to continue it.   Continues meds Adderall XR 20 mg every morning, stopped Wellbutrin XL 150 every morning, clonidine 0.2 mg twice daily, lorazepam 1 mg every 8 hours as needed.  Trazodone 50 mg tablets 1-2 nightly as needed insomnia Has started nortriptyline 75 mg nightly for about 3 weeks and DT level 176, reduced to 50 mg HS early November. Increased pramipexole to 0.5 mg  to TID  off label for treatment resistant depression  Started Auvelity 1 AM & PM mid Feb. Mood markedly better with pramipexole added.  Nearly normal mood now with minimal depression.  More social and outgoing and motivated and resolved anhedonia. No SE.  Compliant.  02/04/23 appt noted: Continues meds Adderall XR 20 mg every morning, stopped Wellbutrin XL 150 every morning, clonidine 0.2 mg twice daily, lorazepam 1 mg every 8 hours as needed.  Trazodone 50 mg tablets 1-2 nightly as needed insomnia Has started nortriptyline 75 mg nightly for about 3 weeks and DT level 176, reduced to 50 mg HS early November. Increased pramipexole to 0.5 mg  to TID  off label for treatment resistant depression  Started Auvelity 1 AM & PM mid Feb. Received Spravato 84 mg today as  scheduled.  Tolerated it well without nausea or vomiting headache or chest pain or palpitations.  Expected dissociation gradually resolved over the 2 hour observation period. She feels 50% less depressed with Spravto and wants to continue it.  The pramipexole got her the rest of the way better.  Doesn't want to cut back on any tx bc afraid of relapse. Tolerating meds.  02/15/23 appt noted: Continues meds Adderall XR 20 mg every morning, stopped Wellbutrin XL 150 every morning, clonidine 0.2 mg twice daily, lorazepam 1 mg every 8 hours as needed.  Trazodone 50 mg tablets 1-2 nightly as needed insomnia Has started nortriptyline 75 mg nightly for about 3 weeks and DT level 176, reduced to 50 mg HS early November. Increased pramipexole to 0.5 mg  to TID  off label for treatment resistant depression  Started Auvelity 1 AM & PM mid Feb. Received Spravato 84 mg today as scheduled.  Tolerated it well without nausea or vomiting headache or chest pain or palpitations.  Expected dissociation gradually resolved over the 2 hour observation period. She feels no longer depressed.   The pramipexole got her the rest of the way better.  Doesn't want to cut back on any tx bc afraid of relapse. Tolerating meds.  02/18/23 appt noted: Continues meds Adderall XR 20 mg every morning, stopped Wellbutrin XL 150 every morning, clonidine 0.2 mg twice daily, lorazepam 1 mg every 8 hours as needed.  Trazodone 50 mg tablets 1-2 nightly as needed insomnia Has started nortriptyline 75 mg nightly for about 3 weeks and DT level 176, reduced to 50 mg HS early November. Increased pramipexole to 0.5 mg  to TID  off label for treatment resistant depression  Started Auvelity 1 AM & PM mid Feb. Received Spravato 84 mg  today as scheduled.  Tolerated it well without nausea or vomiting headache or chest pain or palpitations.  Expected dissociation gradually resolved over the 2 hour observation period. She feels no longer depressed except when  ran out of Auvelity this week DT lack of availability.  Much worse without it..   The pramipexole got her the rest of the way better.  Doesn't want to cut back on any tx bc afraid of relapse. Tolerating meds.  02/25/23 appt: Received Spravato 84 mg today as scheduled.  Tolerated it well without nausea or vomiting headache or chest pain or palpitations.  Expected dissociation gradually resolved over the 2 hour observation period. Continues meds Adderall XR 20 mg every morning, stopped Wellbutrin XL 150 every morning, clonidine 0.2 mg twice daily, lorazepam 1 mg every 8 hours as needed.  Trazodone 50 mg tablets 1-2 nightly as needed insomnia Has started nortriptyline 75 mg nightly for about 3 weeks and DT level 176, reduced to 50 mg HS early November. Increased pramipexole to 0.5 mg  to TID  off label for treatment resistant depression  Started Auvelity 1 AM & PM mid Feb. No SE Still dramatically better with meds and Spravato.  Not 100% over depression. Had GS born last week and able to enjoy it now.  Is a little sleepy with pramipexole but manageable.  No change desired.  Sleep is ok.  03/04/23 appt noted: Received Spravato 84 mg today as scheduled.  Tolerated it well without nausea or vomiting headache or chest pain or palpitations.  Expected dissociation gradually resolved over the 2 hour observation period. Continues meds Adderall XR 20 mg every morning, stopped Wellbutrin XL 150 every morning, clonidine 0.2 mg twice daily, lorazepam 1 mg every 8 hours as needed.  Trazodone 50 mg tablets 1-2 nightly as needed insomnia nortriptyline reduced to 50 mg HS early November. Increased pramipexole to 0.5 mg  to TID  off label for treatment resistant depression  Started Auvelity 1 AM & PM mid Feb. Still generally doing well with meds and Spravato except when got tooth abscess.  [place] more depressed after dental surgery.   No SE with meds.   Sleep is OK.  No med changes desired  03/11/23 appt  noted: Received Spravato 84 mg today as scheduled.  Tolerated it well without nausea or vomiting headache or chest pain or palpitations.  Expected dissociation gradually resolved over the 2 hour observation period. Continues meds Adderall XR 20 mg every morning, stopped Wellbutrin XL 150 every morning, clonidine 0.2 mg twice daily, lorazepam 1 mg every 8 hours as needed.  Trazodone 50 mg tablets 1-2 nightly as needed insomnia nortriptyline reduced to 50 mg HS early November. Increased pramipexole to 0.5 mg  to TID  off label for treatment resistant depression  Started Auvelity 1 AM & PM mid Feb. Some increase depression with infection and needing amoxiciliin this week.  Otherwie still doing well with mood and meds. No med changes desired or indicated. No SE   ECT-MADRS    Flowsheet Row Clinical Support from 01/08/2023 in [facility] Clinical Support from 08/06/2022 in [facility] Clinical Support from 07[facility]04/2022 in [facility] Clinical Support from 05/21/2022 in [facility] Office Visit from 03/02/2022 in [facility]  MADRS Total Score 21 29 15 27  46        Past Psychiatric Medication Trials: fluoxetine, duloxetine, Viibryd, Pristiq, sertraline, citalopram,  Trintellix anxious and SI  Wellbutrin XL 450 Auvelity 1 dose nortriptyline 75 mg nightly for about 3 weeks and DT level 176, reduced to 50 mg HS NR. Pramipexole 1 mg NR Adderall, Adderall XR, Vyvanse, Ritalin, Strattera low dose NR Lorazepam Trazodone  Depakote,  lamotrigine cog complaints Lithium remotely Abilify 7.5  Vraylar 1.5 mg daily agitation and insomnia Rexulti insomnia Latuda 40 one dose, CO anxious and SI Seroquel XR 300 Olanzapine 10  At visit November 12, 2019. We discussed Patient developed an increasingly severe alcohol dependence problem since her last visit in January.   She went to [facility] and has had no alcohol since then except 1 day.  She never abused stimulants but they took her off the stimulants at [facility].  Her ADD was markedly worse.  The Wellbutrin did not help the ADD.   D history lamotrigine rash at 67 yo  Review of Systems:  Review of Systems  Constitutional:  Negative for fatigue.  Musculoskeletal:  Positive for back pain. Negative for arthralgias and joint swelling.       SP hip surgery October 2020  Neurological:  Negative for dizziness, tremors and weakness.  Psychiatric/Behavioral:  Positive for decreased concentration and dysphoric mood. Negative for agitation, behavioral problems, confusion, hallucinations, self-injury, sleep disturbance and suicidal ideas. The patient is nervous/anxious. The patient is not hyperactive.        Forgetful at times about med recommendations.  Send present for him The only [place] she notices a it states it is unbelievable him how how much are her 3 steps around freedom of the present fall and just in the past decade  Medications: I have reviewed the patient's current medications.  Current Outpatient Medications  Medication Sig Dispense Refill   amLODipine (NORVASC) 2.5 MG tablet Take 2.5 mg by mouth daily.     amphetamine-dextroamphetamine (ADDERALL XR) 20 MG 24 hr capsule Take 1 capsule (20 mg total) by mouth every morning. 30 capsule 0   [START ON 03/26/2023] amphetamine-dextroamphetamine (ADDERALL XR) 20 MG 24 hr capsule Take 1 capsule (20 mg total) by mouth every morning. 30 capsule 0   buPROPion (WELLBUTRIN XL) 150 MG 24 hr tablet TAKE 3 TABLETS BY MOUTH DAILY (Patient taking differently: Take 150 mg by mouth daily.) 270 tablet 1   cloNIDine (CATAPRES) 0.2 MG tablet TAKE 1 TABLET BY MOUTH 2 TIMES DAILY. 180 tablet 0   Dextromethorphan-buPROPion ER (AUVELITY) 45-105 MG TBCR Take 1 tablet by mouth 2 (two) times daily. 60 tablet 3   Esketamine HCl, 84 MG Dose, (SPRAVATO, 84 MG DOSE,) 28  MG/DEVICE SOPK USE 3 SPRAYS IN EACH NOSTRIL ONCE A WEEK 3 each 1   iron polysaccharides (NIFEREX) 150 MG capsule TAKE 1 CAPSULE BY MOUTH EVERY DAY 90 capsule 1   LORazepam (ATIVAN) 1 MG tablet Take 1 tablet (1 mg total) by mouth every 8 (eight) hours as needed. for anxiety 90 tablet 1   losartan (COZAAR) 50 MG tablet Take 50 mg by mouth daily.     nebivolol (BYSTOLIC) 2.5 MG tablet Take 2.5 mg by mouth daily.     nortriptyline (PAMELOR) 25 MG capsule Take 2 capsules (50 mg total) by mouth at bedtime. 180 capsule 0   pramipexole (MIRAPEX) 0.5 MG tablet Take 1 tablet (0.5 mg total) by mouth 3 (three) times daily. 90 tablet 1   traZODone (DESYREL) 50 MG tablet TAKE 1-2 TABLETS BY MOUTH NIGHTLY AS NEEDED FOR SLEEP 180 tablet 1   No current facility-administered medications for this visit.  Medication Side Effects: None  Allergies:  Allergies  Allergen Reactions   Metronidazole Shortness Of Breath and Other (See Comments)    Heart pounding   Ferrlecit [Na Ferric Gluc Cplx In Sucrose] Other (See Comments)    Infusion reaction 05/12/2019    Past Medical History:  Diagnosis Date   ADHD    Anemia    Anxiety    Arthritis    Depression    Heart murmur    i went to see a cardiologit slast eyar  and i had zero plaque,    PONV (postoperative nausea and vomiting)    Recovering alcoholic in remission (HCC)     Family History  Problem Relation Age of Onset   Atrial fibrillation Mother    CAD Father     Past Medical History, Surgical history, Social history, and Family history were reviewed and updated as appropriate.   Please see review of systems for further details on the patient's review from today.   Objective:   Physical Exam:  There were no vitals taken for this visit.  Physical Exam Constitutional:      General: She is not in acute distress. Neurological:     Mental Status: She is alert and oriented to person, place, and time.     Coordination: Coordination normal.      Gait: Gait normal.  Psychiatric:        Attention and Perception: Attention and perception normal.        Mood and Affect: Mood is anxious and depressed. Affect is not tearful.        Speech: Speech is not rapid and pressured or slurred.        Behavior: Behavior is not slowed.        Thought Content: Thought content is not paranoid or delusional. Thought content does not include homicidal or suicidal ideation. Thought content does not include suicidal plan.        Cognition and Memory: Cognition normal. Memory is not impaired.        Judgment: Judgment normal.     Comments: Insight intact. No auditory or visual hallucinations. No delusions.  Depression nearly resolved with pramipexole TID and relapsed when out of Auvelity.     Lab Review:     Component Value Date/Time   NA 137 01/12/2021 1430   NA 140 11/18/2018 1544   K 3.8 01/12/2021 1430   CL 108 01/12/2021 1430   CO2 22 01/12/2021 1430   GLUCOSE 94 01/12/2021 1430   BUN 14 01/12/2021 1430   BUN 20 11/18/2018 1544   CREATININE 0.82 01/12/2021 1430   CALCIUM 8.9 01/12/2021 1430   PROT 6.6 01/12/2021 1430   ALBUMIN 3.9 01/12/2021 1430   AST 12 (L) 01/12/2021 1430   ALT 11 01/12/2021 1430   ALKPHOS 46 01/12/2021 1430   BILITOT 0.5 01/12/2021 1430   GFRNONAA >60 01/12/2021 1430   GFRAA >60 09/02/2019 0249   GFRAA >60 01/27/2019 0811       Component Value Date/Time   WBC 4.5 01/12/2021 1430   RBC 4.32 01/12/2021 1430   HGB 12.8 01/12/2021 1430   HGB 12.9 07/17/2019 0953   HCT 38.5 01/12/2021 1430   HCT 21.9 (L) 12/25/2018 1221   PLT 272 01/12/2021 1430   PLT 286 07/17/2019 0953   MCV 89.1 01/12/2021 1430   MCH 29.6 01/12/2021 1430   MCHC 33.2 01/12/2021 1430   RDW 12.4 01/12/2021 1430   LYMPHSABS 1.4 01/12/2021 1430   MONOABS 0.4  01/12/2021 1430   EOSABS 0.0 01/12/2021 1430   BASOSABS 0.0 01/12/2021 1430    No results found for: "POCLITH", "LITHIUM"   No results found for: "PHENYTOIN", "PHENOBARB",  "VALPROATE", "CBMZ"   .res Assessment: Plan:    Recurrent major depression resistant to treatment (HCC)  Generalized anxiety disorder  Attention deficit hyperactivity disorder (ADHD), predominantly inattentive type  Insomnia due to mental condition  Accelerated hypertension   She has treatment resistant major depression ongoing with 50% better with Spravato. Have  discussed some of her  abnormal behaviors last year leading to this depressive episode getting worse which she says were associated with heavy use of delta 8 and not a manic episode.  She realizes now that that was not good for her.  She stopped all use of other drugs including those available over-the-counter such as delta 8 or any other THC related products.  She is no longer having any of those types of behaviors and instead is depressed.  She is experiencing more pleasure and is less blunted and enjoying more and better inteest versus before the Spravato.  She also feels worse if misses a dose of Spravato.  Depression resolved recently after increasing pramipexole 0.5 mg TID, brief relapse when out of Auvelity Consider switch to ER to reduce sleepiness  Patient was administered Spravato 84 mg intranasally today.  The patient experienced the typical dissociation which gradually resolved over the 2-hour period of observation.  There were no complications.  Specifically the patient did not have nausea or vomiting or headache.  Blood pressures monitored at the 40-minute and 2-hour follow-up intervals.  Borderline high.  By the time the 2-hour observation period was met the patient was alert and oriented and able to exit without assistance.  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  See nursing note for further details.She wants to continue Spravato. We discussed discussed the side effects in detail as well as the protocol required to receive  Spravato.   Failed multiple antidepressants.  Many of them were not actual failures but intolerances and it is unclear whether some of that was more connected with anxiety than true side effects.  1 example is the [place].  In general she does not want to try anything but an antidepressant but has failed all major categories of antidepressants except TCAs and MAO inhibitors which have not been tried until now.  Started nortriptyline 75 mg nightly.  Serum level 176.  So Reduced to 50 mg HS. Consider stopping it.  Discussed side effects in detail.  Needs more time to help. Extensive discussion previously about her ambivalence about meds and missatributing sx of depression as SE of meds.    She is tolerating Auvelity.  Continue 1  BID bc more depressed without it.  Worse with less pramipexole. Increased to 0.5 mg TID on about end of Feb 2024 and markedly better Dosing range off label for depression ranges from 1-5 mg daily.  Disc risk compulsive impulsive behavior and manis.    Started Spravato 84 mg twice weekly on 03/16/2022.  Now on weekly administration  Adderall  XR 20 mg AM   Ativan 1 mg 3 times daily as needed anxiety but try to cut it back.  She still feels she needs it. Is not ideal to use benzodiazepine with stimulant but because of the severity of her symptoms it has been necessary.  Hope to eventually eliminate the benzodiazepine.  Expected  as her depression improves her anxiety will improve as well.   We will expect that to improve as the depression improves.  She has headed insturctions to reduce this.  Continue clonidine 0.2 mg BID off label for anxiety and helps BP partially. BP is better controlled but not consistent.  Consider increasing amlodipine.  Also on losartan 50  Discussed potential benefits, risks, and side effects of stimulants with patient to include increased heart rate, palpitations, insomnia, increased anxiety, increased irritability, or decreased appetite.  Instructed  patient to contact office if experiencing any significant tolerability issues. She wants to return to usual dose of Adderall for ADD bc of mor poor cognitive function with reduction.  Also discussed that depression will impair cognitive function.  Disc risk polypharmacy.  Reevaluate meds after better with pramipexole.  Relapsed after running out of Auvelity so needs clearly it and pramipexole.  Give it more time.  Rec keep track of BP and discuss with PCP. It has been up and down.  Sometimes needs extra clonidine before Spravato  Has Maintained sobriety  No med changes indicated.  FU with Spravato weekly   Meredith Staggers, MD, DFAPA     Please see After Visit Summary for patient specific instructions.  No future appointments.                  No orders of the defined types were placed in this encounter.      -------------------------------

## 2023-03-18 ENCOUNTER — Encounter: Payer: 59 | Admitting: Psychiatry

## 2023-03-19 ENCOUNTER — Encounter: Payer: 59 | Admitting: Psychiatry

## 2023-03-20 ENCOUNTER — Other Ambulatory Visit: Payer: Self-pay | Admitting: Psychiatry

## 2023-03-21 ENCOUNTER — Encounter: Payer: Self-pay | Admitting: Psychiatry

## 2023-03-21 ENCOUNTER — Ambulatory Visit (INDEPENDENT_AMBULATORY_CARE_PROVIDER_SITE_OTHER): Payer: 59 | Admitting: Psychiatry

## 2023-03-21 ENCOUNTER — Ambulatory Visit: Payer: 59

## 2023-03-21 VITALS — BP 102/81 | HR 62

## 2023-03-21 DIAGNOSIS — F339 Major depressive disorder, recurrent, unspecified: Secondary | ICD-10-CM | POA: Diagnosis not present

## 2023-03-21 DIAGNOSIS — I10 Essential (primary) hypertension: Secondary | ICD-10-CM

## 2023-03-21 DIAGNOSIS — F5105 Insomnia due to other mental disorder: Secondary | ICD-10-CM

## 2023-03-21 DIAGNOSIS — F900 Attention-deficit hyperactivity disorder, predominantly inattentive type: Secondary | ICD-10-CM | POA: Diagnosis not present

## 2023-03-21 DIAGNOSIS — F411 Generalized anxiety disorder: Secondary | ICD-10-CM

## 2023-03-21 NOTE — Progress Notes (Signed)
Laura Chang [id number] 05-Oct-1956 67 y.o.  Subjective:   Patient ID:  Laura Chang is a 67 y.o. (DOB 09-01-1956) female.  Chief Complaint:  Chief Complaint  Patient presents with   Follow-up   Depression   Anxiety   ADD      Laura Chang presents to the office today for follow-up of depression and anxiety and ADD.  seen November 12, 2019.  Melted down in 2020.  Went to [employer] in July.  No withdrawal.  1 drink since then.  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 She was on no stimulant and no SSRI but was taking Strattera and Wellbutrin.  The following changes were made. Stop Strattera. OK restart stimulant bc severe ADD Restart Adderall 1 daily for a few days and if tolerated then restart 1 twice daily. If not tolerated reduce the dosage if needed. May need to stop Wellbutrin if not tolerating the stimulant.  Yes.  DC Wellbutrin Restart Prozac 20 mg daily.  February 2021 appointment with the following noted: Completed grant proposal.  Couldn't doit without Adderall.  Sold a bunch of work.   Adderall XR lasts about 3 pm.  Strength seems about right.  BP been OK.  Not jittery.   Stopped Wellbutrin but had no SE. Mood drastically better with grant proposal and back on fluoxetine.  Less depressed and lethargic.  No anxiety.  Cut back on coffee. Started back with devotions and stronger faith. Plan: Continue Prozac 20 mg daily. May have to increase the dose at some point in the future given that she usually was taking higher dosages but she is getting good response at this time. Restart Wellbutrin off label for ADD since can't get 2 ADDERALL daily. 150 mg daily then 300 mg daily. She can adjust the dose between 150 mg and 300 mg daily to get the optimal effect.   05/11/2020 appointment with the following noted: Has been inconsistent with Prozac and Wellbutrin. Not sure of the effect of Wellbutrin. Biggest deterrent in work is anxiety.   Some of the work is conceptual and difficult at times.  Can feel she's not up to a project at times.  Overall is OK but would like a steadier benefit from stimulant.  Exhausted from managing concentration and keeping up with things from the day.  Loses things.  Not good keeping up with schedule. Overall productive and emotionally OK. Can feel Adderall wear off. Mood is better in summer and worse in the winter.   F died in 10-15-2023 and that is a loss. No SE Wellbutrin. Still attends AA meetings.  Real benefit from [facility] last year. Recognizes effect of anemia on ADD and mood.  Had iron infusions last winter. Plan:  Wellbutrin off label for ADD since can't get 2 ADDERALL daily. 150 mg daily then 300 mg daily.  01/24/2021 appointment with following noted: Doing a program called [employer] mindfulness app since Xmas.  CBT app helped the depression.  App helped her focus better.  Lost sign weight. Writing a lot. Before Xmas felt depressed and started negative thinking worse, self denigrating. Not drinking. More isolated.   Recognizes mo is narcissist.    Didn't tell anyone she was born until 3 mos later.  M aloof and uninterested in pt.  Lied about her birthday.  Mo lack of affection even with pt's kids. Going to [place] for a year and it helped her to quit drinking. Also misses kids being gone  with a hole also. Plan: No med changes  05/04/2021 appointment with the following noted: Therapist [name] thinks she's manic. Lost weight to 144#.   States she is still sleeping okay.  Admits she is hyper and recognizes that she is likely manic.  She feels great, euphoric with an increased sense of spiritual connectedness to God.  She has racing thoughts and talks fast and talks a lot and this is noted by her husband.  He thinks she is a bit hyper.  She has been able to maintain sobriety although she will have 1 glass of wine on special occasions but does not drink by herself.  She is not drinking to  excess.  She denies any dangerous impulsivity.  She is clearly not depressed and not particularly anxious.  She has no concerns about her medication and she has been compliant.  06/16/21 appt noted: So much better.  Going through a lot but the manic thing happened on top of it.  So much slower.  Didn't feel like losing anything with risperidone.  Likes the Adderalll at 10 mg. Some drowsiness in the AM and very drowsy from risperidone 2 mg HS. Prayer life is better. Handling stress better. Less depressed with risperidone. Still likes trazodone. Sleeps well. Plan: Reduce Prozac to 10 mg daily.  Consider stopping it because it can feel the mania however she is reluctant to do that because she fears relapse of depression. Reduce risperidone to 1.5 mg nightly due to side effects.  Discussed risk of worsening mania.  07/25/2021 appointment with the following noted: Misses the Adderall and hard to function without it. Depressed now. Heavy chest.  Anxious and guilty.  Body feels heavy.   Hates Wellbutrin.   Plan: Increase fluoxetine to 20 mg daily Add Abilify 1/2 of 15 mg tablet daily Wean wellbutrin by 1 tablet each week  bc she feels it is not helpful and DT polypharmacy Reduce risperidone to 1 daily for 1 week and stop it. Disc risk of mania. Increase Adderall to XR 20 mg AM  08/08/21 Much less depressed and starting to feel normal I feel a lot better. No SE.  Speech normal off risperidone. Sleeping OK on trazaodone and enough.   Noticed benefit from Adderall again. Plan: continue fluoxetine to 20 mg daily Continue Abilify 1/2 of 15 mg tablet daily for depression and mania continue Adderall to XR 20 mg AM  10/10/2021 phone call: Pt stated she feels like the Abilify should be decreased to 23m.She said she is depressed but rational and not suicidal.She has an appt Monday and can wait until then if you prefer. MD response: Reduce the Abilify to 7.5 mg every other day.  We will meet on 10/16/2021  and decide what to do from there.  10/16/2021 appointment with the following noted: More depressed.  Most depressed I've ever been.  Just numb.  Sense of grief.   Thinks the manic episode was unlike anything else she ever had.  Doesn't want to medicate against it.  Don't enjoy people.  Easily overwhelmed.  Had some death thoughts but not suicidal.  Has been functional.  Feels better today after reducing Abilify to every other day but she is only been doing that for 3 days. A/P: Episode of post manic depression was explained. continue fluoxetine to 20 mg daily Hold Abilify for 1 week then resume Abilify 1/2 of 15 mg tablet every other day for depression and mania continue Adderall to XR 20 mg AM  10/27/2021  appointment with the following noted: I'm doing so much better.  Handling the depression better. Better self talk and spiritual focus has helped.   Dep 6/10 manifesting as anxiety with low confidence.   F died 2  years ago and M 67 yo and is dependent . She is working hard to feel better but still feels depressed.  She almost feels like she has a little more anxiety since restarting Abilify every other day. Plan: continue fluoxetine to 20 mg daily DC Abilify .  Vrayalar 1.5 mg QOD to try to get rid of depression ASAP. continue Adderall to XR 20 mg AM  11/10/2021 appointment with the following noted: Busy with Xmas and it was fun with family but then a big let down.  Did well with it.  Functioned well with it.  Working hard on things with depression.  Not shutting down. Not sure but feels better today but yesterday was hard.  Difficulty dealing with mother.  She won't do anything to help herself.  Yesterday with her all day.  Won't do PT and has isolated herself.    Lack of confidence.   No SE with Vraylar.  11/24/21 urgent appointment appt noted: More and more depressed.   So anxious and doesn't want to be alone but can do so. No appetite. Hurts inside. Has had some fleeting suicidal  thoughts but would not act on them.  Tolerating meds. Has been consistent with Vraylar 1.5 mg every other day, fluoxetine 20 mg daily Plan: Increase Vraylar to 1.5 mg daily Change Prozac to Trintellix 10 mg daily. Discussed side effects of each continue Adderall to XR 20 mg AM  12/27/2021 appointment with the following noted: Not OK.  I feel less depressed but feels bat shit. Not sleeping well.  Extremely anxious. Off and on sleep. 3-4 hours of sleep.   Still having daily SI.  But also become obvious has so much to do.  Overwhelmed by tasks.   Needs anxiety meds to just function. Not more motivated.  Walked yesterday.   Feels afraid like in trouble but not irritable or angry. DC DT agitation Vraylar to 1.5 mg daily Change Prozac to Trintellix 10 mg daily. Hold Adderall to XR 20 mg AM Clonidine 0.1 1/2 tablet twice daily for 2 days and if needed for anxiety and sleep increase to 1 twice daily Ok temporary Ativan 1 mg 3 times daily as needed anxiety  01/05/22 appt noted: Off fluoxetine and  Trintellix.  Only on Ativan, trazodone and Adderall XR 20 plus added clonidine 0.1 mg BID Didn't think she needed to start Trintellix. Not taking Ativan.   Didn't like herself last week. Feels some better today. Wonders if the manic sx Not agitated.  Anxiety kind of calmed down.  A lot to be anxious about situationally.  $ stress. Concerns about downers with meds. Can't access normal personality. ? Lethargy and inability to talk as sE. Plan: Latuda 20-40 mg daily with food. Adderall to XR 20 mg AM Clonidine 0.1 1/2 tablet twice daily  reduce dose to be sure no SE Ok temporary Ativan 1 mg 3 times daily as needed anxiety  01/19/22 appt noted: Taking Latuda 20 mg daily.  Took 40 mg once and felt anxious and  SI Still depressed and not very reactive Anxiety mainly about the depression and fears of the future. She wants to revisit manic sx and thinks it was maybe bc taking delta 8 bc was taking a lot  of it so still  doesn't think she's classic bipolar. She wants to only take Prozac bc thinks Latuda is perpetuating depression. Says the delta 8 was very psychaedelic.  When not taking it was not manic.  Sleeping ok again.  Plan: Per her request DC Latuda 20-40 mg daily with food. She wants to continue Prozac alone AMA  Adderall to XR 20 mg AM Clonidine 0.1 1/2 tablet twice daily  reduce dose to be sure no SE Ok temporary Ativan 1 mg 3 times daily as needed anxiety  01/23/2022 phone call complaining of increased anxiety since stopping Latuda.  She will try increasing clonidine.  01/26/2022 phone call not feeling well and wanted to restart the Vraylar.  However notes indicate that had made her agitated therefore she was encouraged to pick up samples of Rexulti 1 mg and start that instead.  02/06/2022 phone call: Stating she felt the Rexulti was helping with depression but she was not sleeping well and obsessing over things.  She was encouraged to increase Rexulti to 2 mg daily and increase trazodone for sleep.  02/09/2022 appointment with the following noted: This was an urgent work in appointment No sleep last night with trazodone 100 mg HS Nothing really better depression or anxiety. Ruminating negative anxious thoughts. Did not tolerate Rexulti because it was causing insomnia.  Does not think it helped depression.  Lacks emotion that she should have.  Lacks her usual personality.  Some hopeless thoughts.  Some death thoughts.  Some suicidal thoughts without plan or intent Plan: DC Rexulti and Prozac & DC trazodone Adderall to XR 20 mg AM Clonidine 0.1 1/tablet twice daily  reduce dose to be sure no SE Ok temporary Ativan 1 mg 3 times daily as needed anxiety Start Seroquel XR 150 mg nightly  03/02/2022 appointment: Langley Gauss called back a few days after starting Seroquel stating it was making her more anxious and more depressed.  This seemed unlikely as this medicine rarely ever causes anxiety.   She stopped the medication waited 3 days and called back still had anxiety and depression but thought perhaps the anxiety was a little better.  She did not want to take the Seroquel. She knew about the option of Spravato and wanted to pursue that. Now questions whether to return to Seroquel while waiting to start Spravato bc feels just as bad without it and knows she didn't give it enough time to work.   MADRS 46  ECT-MADRS    Flowsheet Row Clinical Support from 01/08/2023 in [place] from 08/06/2022 in [place] from 07/04/2022 in [place] from 05/21/2022 in [place] Office Visit from 03/02/2022 in [facility]  MADRS Total Score '21 29 15 27 '$ 46      03/14/22 appt noted: Pt received Spravato 56 mg first dose today with some dissociative sx which were not severe.  She was anxious prior to the administration and felt better after receiving lorazepam 1 mg.  No NV, or HA. Wants to continue Spravato. Ongoing depression and desperate to feel better.  I'm not myself DT deprsssion which is most severe in recent history.  Anhedonia.  Low motivation.  Social avoidance. Continues to think all recent med trials are making her worse.  Sleep ok with Seroquel.  03/16/22 appt noted: Received Spravato 84 mg for the first time.  some dissociative sx which were not severe.  She was anxious prior to  the administration and felt better after receiving lorazepam 1 mg.  No NV, or HA. Wants to continue Spravato.   Does not feel any better or different since the last appt.  Ongoing depression.  Ongoing depression and desperate to feel better.  I'm not myself DT deprsssion which is most severe in recent history.  Anhedonia.  Low motivation.  Social avoidance. Continues to think all recent med trials are making her worse.  Sleep  ok with Seroquel.  Does not want to continue Seroquel for TRD.  03/20/2022 appointment noted: Came for Spravato administration today.  However blood pressure was significantly elevated approximately 180/115.  She was given lorazepam 1 mg and clonidine 0.2 mg to try to get it down. She states she regretted stopping the Seroquel XR 300 mg tablets.  She now realizes it was helpful.  She did not sleep much at all last night.  She did not take the Adderall this morning. 2 to 3 hours after arrival blood pressure was still elevated at  170/110, 62 pulse.  For Spravato administration was canceled for today.  She admits to being anxious and depressed.  She is not suicidal.  She is highly motivated to receive the Spravato.  We discussed getting it tomorrow.  03/22/2022 appointment noted: Patient's blood pressure was never stable enough yesterday in order to get her in for Spravato administration.  She was encouraged to see her primary care doctor.  It is better today.  03/26/2022 appointment with the following noted: Blood pressure was better.  Saw her primary care doctor who started on oral Bystolic 2.5 mg daily. Received Spravato 84 mg today as scheduled.  Tolerated it well without nausea or vomiting headache or chest pain or palpitations.  Her blood pressure was borderline but manageable. She remains depressed and anxious.  She is ambivalent about the medicine and desperate to get to feel better.  Continues to have anhedonia and low energy and low motivation and reduced ability to do things.  Less social.  Not suicidal.  03/28/22 appt noted: Received Spravato 84 mg today as scheduled.  Tolerated it well without nausea or vomiting headache or chest pain or palpitations.  Her blood pressure was borderline but manageable. Has not seen any improvement so far.  Tolerating Seroquel.  Inconsistent with Bystolic and BP has been borderline high. Still depressed and anxious and anhedonia.  Low motivation, energy,  productivity. Taking quetiapine and tolerating XR 300 mg nightly.  04/04/22 appt noted: Received Spravato 84 mg today as scheduled.  Tolerated it well without nausea or vomiting headache or chest pain or palpitations.  Her blood pressure was borderline but manageable. Has not seen any improvement so far.  Tolerating Seroquel.   She still tends to think that the medications are making her worse.  She has said this about each of the recent psychiatric medicines including Seroquel.  However her husband thinks she is improved.  She also admits there is some improvement in productivity.  She still feels highly anxious.  She still does not enjoy things as normal.  She still feels desperate to improve as soon as possible. Has been taking Seroquel XR since 03/20/2022  04/10/22 appt noted: Received Spravato 84 mg today as scheduled.  Tolerated it well without nausea or vomiting headache or chest pain or palpitations.  Her blood pressure was borderline but manageable. Has not seen any improvement so far.  Tolerating Seroquel.  Doesn't like Seroquel bc she thinks it flattens here. 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  Tolerated it well without nausea or vomiting headache or chest pain or palpitations.  Her blood pressure was borderline but manageable. Has not seen any improvement so far.  Tolerating Seroquel.  Doesn't like Seroquel bc she thinks it flattens her. Received Spravato 84 mg today as scheduled.  Tolerated it well without nausea or vomiting headache or chest pain or palpitations.  Her blood pressure was borderline but manageable. Has not seen any improvement so far.  Tolerating Seroquel.  Doesn't like Seroquel bc she thinks it flattens here.  We discussed her ambivalence about it. She is starting Auvelity and has tolerated it  the last 2 days without side effect.  She still does not feel like herself and feels flat and not enjoying things with suppressed expressed emotion  04/17/2022 appointment with the following noted: Received Spravato 84 mg today as scheduled.  Tolerated it well without nausea or vomiting headache or chest pain or palpitations.  Her blood pressure was borderline but manageable. Has not seen any improvement so far.  Tolerating Seroquel.  Doesn't like Seroquel bc she thinks it flattens her. She has been tolerating the Auvelity 1 in the morning without side effects for about a week.  She has not noticed significant improvement so far.  She still feels depressed and flat and not herself.  Other people notice that she is flat emotionally.  She is not suicidal.  She does feel discouraged that she is not getting better yet.  04/19/2022 appointment noted: Has increased Auvelity to 1 twice daily for 2 days, continues quetiapine XR 300 mg nightly, clonidine 0.3 mg twice daily, lorazepam 1 mg twice daily for anxiety and Adderall XR 20 mg in the morning. No obious SE but she still thinks quetiapine XR is making her feel down.  But not sedated Received Spravato 84 mg today as scheduled.  Tolerated it well without nausea or vomiting headache or chest pain or palpitations.  Her blood pressure was borderline but manageable. She still feels quite anxious and feels it necessary to take both the clonidine and lorazepam twice a day to manage her anxiety.  She has been consistently down and flat and not herself until yesterday afternoon she noted an improvement in mood and feeling much more like herself with her normal personality reemerging.  She was quite depressed in the morning with very dark negative thoughts.  She did not have those dark negative thoughts this morning.  She had a lot of questions about medication and when she was expecting to be improved and why she has not shown improvement up to now.  04/23/22 appt  noted: Has increased Auvelity to 1 twice daily for 1 week, continues quetiapine XR 300 mg nightly, clonidine 0.3 mg twice daily, lorazepam 1 mg twice daily for anxiety and Adderall XR 20 mg in the morning. No obious SE but she still thinks quetiapine XR is making her feel down.  But not sedated Received Spravato 84 mg today as scheduled.  Tolerated it well without nausea or vomiting headache or chest pain or palpitations.  She is still depressed but admits better function and is able to enjoy social interactions. Tolerating meds.  Would like to feel better for sure. Not herself.  Flat. Plan increase Auvelity to 1 tab BID as planned and reduce Quetiapine to 1/2 of ER 300 mg  bc NR for depression.  04/25/2022 appointment with the following noted: clonidine 0.3 mg twice daily, lorazepam 1 mg twice daily for anxiety and Adderall XR 20 mg in the morning. Seroquel XR 300 HS No obious SE but she still thinks quetiapine XR is making her feel down.  But not sedated Received Spravato 84 mg today as scheduled.  Tolerated it well without nausea or vomiting headache or chest pain or palpitations.  Called yesterday with more anxiety.  Had increased Auvelity for 1 day and reduced Seroquel XR for 1 day.  Felt restless and fearful  05/01/2022 appointment noted: clonidine 0.3 mg twice daily, lorazepam 1 mg twice daily for anxiety and Adderall XR 20 mg in the morning. Seroquel XR 150 HS, Auvelity 1 BID Received Spravato 84 mg today as scheduled.  Tolerated it well without nausea or vomiting headache or chest pain or palpitations.  Nurse has noted patient has called multiple times sometimes asking the same question repeatedly.  It is unclear whether she is truly forgetful or is just anxious seeking reassurance. Patient acknowledges ongoing depression as well as some anxiety but states she has felt a little better in the last couple of days.  She has reduced the Seroquel to 150 mg at night and has increased Auvelity to 1  twice daily but only for 1 day.  So far she seems to be tolerating it.  05/03/22 appt noted: clonidine 0.2 mg twice daily, lorazepam 1 mg twice daily for anxiety and Adderall XR 20 mg in the morning. Seroquel XR 150 HS, Auvelity 1 BID BP high this am about 170/100 and received extra clonidine 0.2 mg and came to receive Spravato.  Not dizzy, no SOB, nor CP but BP is still high Could not receive Spravato today bc BP high and pulse low at 30 ppm. Still depressed and anxious. Plan: continue trial Auvelity with Spravato She needs to get BP and pulse managed  05/08/22 TC: RTC  H Michael NA and mailbox full.  Could not leave message.  Pt  -  talked to she and H on speaker. H worried over wife.  Vacant stare.  Slurs words at times.  Not smiling. Reduced enjoyment.  Depression.  Withdrawn from usual activities.  Some irritability.  Anxious. Disc her concerns meds are making her worse.  Extensive discussion about her treatment resistant status.  There is a consistent pattern of not taking the medicines long enough to get benefit because she believes the meds are making her worse.  However the symptoms she describes as side effects are exactly the same symptoms that she had prior to taking the medication RX for  the depression.  So it is not clear that these are actual side effects. This is true about the 2 most recent meds including Seroquel and Auvelity.  Recommend psychiatric consultation in hopes of improving her comfort level with taking prescribed medications for a sufficient length of time to provide benefit. Extensive discussion about ECT is the treatment of choice for treatment resistant depression.  Spravato may work if she can comply with consistency.  There are medication options but they take longer to work.   Plan:  Reduce clonidine to 0.1 mg BID DT bradycardia.  Talk with PCP about BP and low pulse problems which are interfering with her consistent compliance with Spravato.   Limit lorazepam to  3 -4  mg daily max. Excess use is the cause of slurring speech.  She must stop excess use or will have to stop the med. Stop Auvelity per  her request.  But she has only been on the full dose for a little over a week and clearly has not had time to get benefit from it.  She thinks maybe it is making her more anxious. Reduce Seroquel from 150XR to 50 -100 mg at night IR.  She couldn't sleep when stopped it completely. Will not start new antidepressant until her SE issues are resolved or not. Get second psych opinion from Yehuda Budd MD or another psychiatrist.  H's sister is therapist in [place] [facility], MD, [facility]  05/16/2022 appointment with the following noted: Received Spravato 84 mg today as scheduled.  Tolerated it well without nausea or vomiting headache or chest pain or palpitations.  She stopped Auvelity as discussed last week. On her own, without physician input, she restarted Wellbutrin XL 450 mg every morning today.  She had taken it in the past.  She feels jittery and anxious. She feels less depressed than she did last week.  But she is still depressed without her usual range of affect.  She still is less social and less motivated than normal. Her primary care doctor increased the dose of losartan Plan: Stop Seroquel Reduce Wellbutrin XL to 300 mg every morning.  Starting the dose at 450 every morning is likely causing side effects of jitteriness and it should not be started at that have a dosage. Recommend she not change meds on her own without MDM put  05/23/2022 appointment with the following noted: Received Spravato 84 mg today as scheduled.  Tolerated it well without nausea or vomiting headache or chest pain or palpitations.  Has not dropped seroquel XR 300 mg 1/2 tablet nightly bc couldn't sleep without it. Has not tried lower dose quetiapine 50 mg HS Still feels depressed.   BP is better managed so far, just saw PCP.  BP is better today and infact is low today. Dropped  clonidine as directed from 0.3 mg BID bc inadequate control of BP to 0.2 mg BID.  However she wants to increase it back to 0.3 mg twice daily because she feels it helped her anxiety better.  Wonders about increasing Wellbutrin for depression.  However she has only been on 300 mg a day for a week.  She was on 450 mg daily in the past.  06/06/22 appt noted: Received Spravato 84 mg today as scheduled.  Tolerated it well without nausea or vomiting headache or chest pain or palpitations.  She is still depressed and anxious.  She wants to try to stop the Seroquel but cannot sleep without some of it.  She is taking lorazepam 1 mg 4 times daily and still having a lot of anxiety.  She wants to increase clonidine back to 0.3 mg twice daily.  She hopes for more improvement She recently went for a second psychiatric opinion as suggested the results of that are pending.  06/11/22 appt noted: Received Spravato 84 mg today as scheduled.  Tolerated it well without nausea or vomiting headache or chest pain or palpitations.  She is still depressed and anxious. Without much change.  Still hopeless, anhedonia, reduced inteterest and motivation.  Tolerating meds. Disc concerns Spravato is not hleping much. Plan: stop Seroquel and start olanzapine 10 mg HS for TRD and anxiety.  06/13/2022 appointment noted: Received Spravato 84 mg today as scheduled.  Tolerated it well without nausea or vomiting headache or chest pain or palpitations.  She is still depressed and anxious. Without much change.  Still hopeless, anhedonia, reduced inteterest  and motivation.  Tolerating meds. Disc concerns Spravato is not helping much as hoped but is improving a bit in the last week. Tolerating meds. Continues Wellbutrin XL 450 AM, tolerating recently started olanzapine  10 mg HS. Sleep is good.   Pending appt with [place] consult.  06/18/22 appt noted: Received Spravato 84 mg today as scheduled.  Tolerated it well without nausea or vomiting  headache or chest pain or palpitations.  Tolerating meds. Continues Wellbutrin XL 450 AM, tolerating recently started olanzapine  10 mg HS. Continues Adderall XR 20 amd and has tried to reduce lorazepam to '1mg'$  TID Sleep is good.   Pending appt with [place] consult. Depression is a little bit better in the last week with a little improvement in emotional expression and interest.  She is pushing herself to be more active.  Her daughter thought she was a little better than she has been.  However she is still depressed and still not her normal self with anhedonia and reduced emotional expressiveness.  06/20/22 appt noted: Received Spravato 84 mg today as scheduled.  Tolerated it well without nausea or vomiting headache or chest pain or palpitations.  Tolerating meds with a little sleepiness. Continues Wellbutrin XL 450 AM, tolerating recently started olanzapine  10 mg HS. Continues Adderall XR 20 amd and has tried to reduce lorazepam to '1mg'$  TID Sleep is good.   Mood is improving.  Better funciton.  Anxiety is better with olanzapine. Still not herself and depression not gone with some anhedonia and social avoidance and feeling overwhelmed.  8/14 2023 received Spravato 84 mg 06/27/2022 received Spravato Spravato 84 mg 07/02/2022 received Spravato 84 mg 07/04/2022 received Spravato 84 mg  07/09/2022 appointment noted: Received Spravato 84 mg today as scheduled.  Tolerated it well without nausea or vomiting headache or chest pain or palpitations.  Expected dissociation and feels less depressed with resolution of negative emotions immediately after Spravato and then depression, anxiety creep back in. Continues meds Adderall XR 20 mg every morning, Wellbutrin XL 450 every morning, clonidine 0.1 mg twice daily, lorazepam 1 mg every 6 hours as needed, olanzapine increased from 7.5 to 10 mg nightly on  Tolerating meds.  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  She does note some increased appetite and is somewhat concerned about that but has not gained significant amounts of weight. She has had the [place] consultation which was initially denied but she knows it can be appealed.  However because she is improving with Spravato plus the other medications now she wants to continue the current treatment plan.  07/18/22 appt noted: Continues meds Adderall XR 20 mg every morning, Wellbutrin XL 450 every morning, clonidine 0.1 mg twice daily, lorazepam 1 mg every 6 hours as needed, olanzapine increased from 7.5 to 10 mg nightly on 07/04/2022. Received Spravato 84 mg today as scheduled.  Tolerated it well without nausea or vomiting headache or chest pain or palpitations.  Expected dissociation and feels less depressed with resolution of negative emotions immediately after Spravato and then depression, anxiety creep back in. Continues meds Adderall XR 20 mg every morning, Wellbutrin XL 450 every morning, clonidine 0.1 mg twice daily, lorazepam 1 mg every 6 hours as needed, olanzapine increased from 7.5 to 10 mg nightly on  Tolerating meds.  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  She does note some increased appetite and is somewhat concerned about that but has not gained significant amounts of weight. She has had the [place] consultation which was initially denied but she knows it can be appealed. She continues to have chronic ambivalence about psychiatric medicines and initially tends to blame her depressive symptoms such as decreased concentration and feeling flat on what ever medicine she currently is taking even though she had the same symptoms before the current medicines were started.  Then after discussion she does admit that her depressive symptoms are improved since adding  olanzapine but still has those residual symptoms noted.  07/23/22 received Spravato 84 mg   07/30/2022 appointment noted: Received Spravato 84 mg today as scheduled.  Tolerated it well without nausea or vomiting headache or chest pain or palpitations.  Expected dissociation and feels less depressed with resolution of negative emotions immediately after Spravato and then depression, anxiety creep back in. Continues meds Adderall XR 20 mg every morning, Wellbutrin XL 450 every morning, clonidine 0.1 mg twice daily, lorazepam 1 mg every 6 hours as needed, olanzapine increased from 7.5 to 10 mg nightly on  She has been inconsistent with olanzapine because she continues to be ambivalent about the medications in general and thinks that perhaps the 10 mg is making her feel blunted.  She continues to feel some depression.  She had a good day this week and but still feels somewhat depressed and persistently anxious. Plan: be consistent with olanzapine 10 mg HS for TRD and longer trial for potential benefit for anxiety.  Has not taken it consistently.  08/06/2022 appointment noted: Received Spravato 84 mg today as scheduled.  Tolerated it well without nausea or vomiting headache or chest pain or palpitations.  Expected dissociation and feels less depressed with resolution of negative emotions immediately after Spravato and then depression, anxiety creep back in. Continues meds Adderall XR 20 mg every morning, Wellbutrin XL 450 every morning, clonidine 0.1 mg twice daily, lorazepam 1 mg every 6 hours as needed, olanzapine i 10 mg nightly  She continues to feel depressed but is about 50% better with Spravato.  She is still not herself.  She still has anhedonia.  She still is not her able to engage socially in the typical ways.  She is not jovial and outgoing like normal.  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  She continues to feel that her personality is dampened down.  There is a question about whether it is related to depression or medication. Plan: continue olanzapine 10 for longer trial for TRD and severe anxiety.  08/13/22 appt noted:  Received Spravato 84 mg today as scheduled.  Tolerated it well without nausea or vomiting headache or chest pain or palpitations.  Expected dissociation and feels less depressed with resolution of negative emotions immediately after Spravato and then depression, anxiety creep back in. Continues meds Adderall XR 20 mg every morning, Wellbutrin XL 450 every morning, clonidine 0.1 mg twice daily, lorazepam 1 mg every 6 hours as needed, olanzapine i 10 mg nightly  She still does not feel herself.  Still struggles with depression and low motivation and reduced social engagement and reduced interest and reduced emotional expression.  She is somewhat better with the medicines plus Spravato.  She still believes the Spravato makes her blunted and  is not sure how much it helps her anxiety.  She can have good days when her family is around and she is engaged.  She still wants to stop the olanzapine. She has apparently continued to take the trazodone despite having been told to stop it when she started olanzapine.  She feels like she needs the trazodone. Plan: DC olanzapine and Start nortriptyline 25 mg nightly and build up to 75 mg nightly and then check blood level.    08/27/2022 appointment noted: Received Spravato 84 mg today as scheduled.  Tolerated it well without nausea or vomiting headache or chest pain or palpitations.  Expected dissociation and feels less depressed with resolution of negative emotions immediately after Spravato and then depression, anxiety creep back in. Continues meds Adderall XR 20 mg every morning, Wellbutrin XL 450 every morning, clonidine 0.1 mg twice daily, lorazepam 1 mg every 6 hours as needed. Stopped olanzapine and started  nortriptyline which she has taken for about a week is 75 mg nightly. So far she is tolerating the nortriptyline well with the exception of some dry mouth and constipation which she is working to manage.  She does not feel substantially better better or different off the olanzapine.  No change in her sleep which is good.  Main concern currently in addition to the residual depression is anxiety which is somewhat situational with pending arch show.  She is worrying about it more than normal.  Says she is having to take lorazepam twice a day where she had been able to keep reduce it prior to this.  She still does not feel like herself with residual depression with less social interest and less of her usual buoyancy in personality.  She is flatter than normal.  Overall she still feels that the Spravato has been helpful at reducing the severity of the depression.  She is not suicidal. She has not heard anything about the [place] appeal as of yet.  09/05/2022 appointment noted: Received Spravato 84 mg today as scheduled.  Tolerated it well without nausea or vomiting headache or chest pain or palpitations.  Expected dissociation and feels less depressed with resolution of negative emotions immediately after Spravato and then depression, anxiety creep back in. Continues meds Adderall XR 20 mg every morning, Wellbutrin XL 450 every morning, clonidine 0.1 mg twice daily, lorazepam 1 mg every 6 hours as needed. Stopped olanzapine and started nortriptyline which she has taken for about 2 week is 75 mg nightly. Initially blood pressure was a little high causing delay in starting Spravato.  She admitted to feeling a little wound up.  She still experiences a little increase in depression if she goes longer than a week in between doses of Spravato.  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  09/10/22 appt noted: Received  Spravato 84 mg today as scheduled.  Tolerated it well without nausea or vomiting headache or chest pain or palpitations.  Expected dissociation gradually resolved over the 2 hour observation period. She feels 50% less depressed with Spravto and wants to continue it.   Continues meds Adderall XR 20 mg every morning, Wellbutrin XL 450 every morning, clonidine 0.1 mg twice daily, lorazepam 1 mg every 6 hours as needed. Has started nortriptyline 75 mg nightly for about 3 weeks. Has not seen a significant difference with the addition of nortriptyline.  Tolerating it pretty well. She continues  to have some degree of anhedonia and significant depression and anxiety.  Her daughters noticed that she is more needy and calls more frequently.  She acknowledges this as well.  She is clearly still not herself. Plan: pramipexole off label and RX 0.25 mg BID  09/17/2022 appointment noted: Received Spravato 84 mg today as scheduled.  Tolerated it well without nausea or vomiting headache or chest pain or palpitations.  Expected dissociation gradually resolved over the 2 hour observation period. She feels 50% less depressed with Spravto and wants to continue it.   Continues meds Adderall XR 20 mg every morning, Wellbutrin XL 450 every morning, clonidine 0.1 mg twice daily, lorazepam 1 mg every 6 hours as needed. Has started nortriptyline 75 mg nightly for about 3 weeks and DT level 176, reduced to 50 mg HS early November. Still the same sx as noted last visit.  Tolerating meds.   Compliant.  Still depressed and family notices.  Has been able to participate in family interactions.  Some post-show let down and has to do detailed work which is hard for her bc ADD.  Sleep and eating well.  Energy OK but not great.  No SI.  Not cried in a year or so.  Clearly less depressed and hopeless than before the Spravato.  09/24/22 appt noted: Received Spravato 84 mg today as scheduled.  Tolerated it well without nausea or vomiting  headache or chest pain or palpitations.  Expected dissociation gradually resolved over the 2 hour observation period. She feels 50% less depressed with Spravto and wants to continue it.   Continues meds Adderall XR 20 mg every morning, Wellbutrin XL 450 every morning, clonidine 0.1 mg twice daily, lorazepam 1 mg every 6 hours as needed. Has started nortriptyline 75 mg nightly for about 3 weeks and DT level 176, reduced to 50 mg HS early November. Still the same sx as noted last visit.  Tolerating meds.   Compliant.  Still depressed and family notices.  Has been able to participate in family interactions.  Some post-show let down and has to do detailed work which is hard for her bc ADD.  Sleep and eating well.  Energy OK but not great.  No SI.  Not cried in a year or so.  Clearly less depressed and hopeless than before the Spravato. Is not making further progress generally.  Stuck with moderate depression  10/02/22 appt noted: Received Spravato 84 mg today as scheduled.  Tolerated it well without nausea or vomiting headache or chest pain or palpitations.  Expected dissociation gradually resolved over the 2 hour observation period. She feels 50% less depressed with Spravto and wants to continue it.   Continues meds Adderall XR 20 mg every morning, Wellbutrin XL 450 every morning, clonidine 0.1 mg twice daily, lorazepam 1 mg every 6 hours as needed. Has started nortriptyline 75 mg nightly for about 3 weeks and DT level 176, reduced to 50 mg HS early November. Still the same sx as noted last visit.  Tolerating meds.   Compliant.  Still depressed and family notices.  Has been able to participate in family interactions.  Some post-show let down and has to do detailed work which is hard for her bc ADD.  Sleep and eating well.  Energy OK but not great.  No SI.  Not cried in a year or so.  Clearly less depressed and hopeless than before the Spravato. Is not making further progress generally.  Stuck with moderate  depression.  Is  able to function pretty normally. Plan: trial pramipexole 0.25 mg BID off label for depression.   10/08/22 appt noted: Received Spravato 84 mg today as scheduled.  Tolerated it well without nausea or vomiting headache or chest pain or palpitations.  Expected dissociation gradually resolved over the 2 hour observation period. She feels 50% less depressed with Spravto and wants to continue it.   Continues meds Adderall XR 20 mg every morning, Wellbutrin XL 450 every morning, clonidine 0.1 mg twice daily, lorazepam 1 mg every 6 hours as needed. Has started nortriptyline 75 mg nightly for about 3 weeks and DT level 176, reduced to 50 mg HS early November. Still the same sx as noted last visit.  Tolerating meds.   Compliant.  Still depressed and family notices.  Has been able to participate in family interactions.  Some post-show let down and has to do detailed work which is hard for her bc ADD.  Sleep and eating well.  Energy OK but not great.  No SI.  Not cried in a year or so.  Clearly less depressed and hopeless than before the Spravato. Is not making further progress generally.  Stuck with moderate depression.  Behaved and felt pretty normally with family over for Thanksgiving. Doesn't see benefit or SE with pramipexole but thinks maybe it makes her worse. Plan:  increase pramipexole augmentation off label to 0.5 mg BID  10/15/2022 appointment noted: Received Spravato 84 mg today as scheduled.  Tolerated it well without nausea or vomiting headache or chest pain or palpitations.  Expected dissociation gradually resolved over the 2 hour observation period. She feels 50% less depressed with Spravto and wants to continue it.   Continues meds Adderall XR 20 mg every morning, Wellbutrin XL 450 every morning, clonidine 0.2 mg twice daily, lorazepam 1 mg every 6 hours as needed.  Trazodone 50 mg tablets 1-2 nightly as needed insomnia Has started nortriptyline 75 mg nightly for about 3 weeks  and DT level 176, reduced to 50 mg HS early November. Recommended increase pramipexole 0.5 mg twice daily off label for treatment resistant depression on 10/08/2022. She feels better motivated more active with pramipexole 0.5 mg twice daily.  She still is depressed but it is better.  We discussed the possibility of going up in the dose but did not change it.  10/22/2022 appointment noted: Received Spravato 84 mg today as scheduled.  Tolerated it well without nausea or vomiting headache or chest pain or palpitations.  Expected dissociation gradually resolved over the 2 hour observation period. She feels 50% less depressed with Spravto and wants to continue it.   Continues meds Adderall XR 20 mg every morning, Wellbutrin XL 450 every morning, clonidine 0.2 mg twice daily, lorazepam 1 mg every 8 hours as needed.  Trazodone 50 mg tablets 1-2 nightly as needed insomnia Has started nortriptyline 75 mg nightly for about 3 weeks and DT level 176, reduced to 50 mg HS early November. pramipexole 0.5 mg twice daily off label for treatment resistant depression on 10/08/2022. She is better motivated than she was.  She is journaling 3 pages a day.  She has started walking and has walked 5 days a week for 50 minutes for the last 2 weeks.  That is significantly helped her mood.  Her mood tends to be better when she interacts with family.  However she still has some periods of depression.  She is tolerating the medications well.  She still feels like her affect and confidence is not  back to normal. Plan:  increase pramipexole augmentation off label to 0.5 mg tID or 0.75 mg BID   10/29/22 appt noted: Received Spravato 84 mg today as scheduled.  Tolerated it well without nausea or vomiting headache or chest pain or palpitations.  Expected dissociation gradually resolved over the 2 hour observation period. She feels 50% less depressed with Spravto and wants to continue it.   Continues meds Adderall XR 20 mg every  morning, Wellbutrin XL 450 every morning, clonidine 0.2 mg twice daily, lorazepam 1 mg every 8 hours as needed.  Trazodone 50 mg tablets 1-2 nightly as needed insomnia Has started nortriptyline 75 mg nightly for about 3 weeks and DT level 176, reduced to 50 mg HS early November. pramipexole increased to 0.75 mg twice daily off label for treatment resistant depression  She has been feeling somewhat better with the increase in pramipexole and is tolerating it well.  She still is easily overwhelmed.  Her affect and mood can improve now when around her family or doing something positive.  She has been able to be more productive. She is tolerating the current medications. She is still considering TMS as an alternative to Spravato.  11/15/2022 appointment noted: Received Spravato 84 mg today as scheduled.  Tolerated it well without nausea or vomiting headache or chest pain or palpitations.  Expected dissociation gradually resolved over the 2 hour observation period. She feels 50% less depressed with Spravto and wants to continue it.   Continues meds Adderall XR 20 mg every morning, Wellbutrin XL 450 every morning, clonidine 0.2 mg twice daily, lorazepam 1 mg every 8 hours as needed.  Trazodone 50 mg tablets 1-2 nightly as needed insomnia Has started nortriptyline 75 mg nightly for about 3 weeks and DT level 176, reduced to 50 mg HS early November. pramipexole increased to 0.75 mg twice daily off label for treatment resistant depression  He has noticed some increase in depression due to the length of time since the last Spravato administration.  She believes Spravato is helping her.  sHe is not suicidal but has felt very blue the last few days. She is tolerating the medication. She is ambivalent about Spravato versus [place] but she is considering [place]. She wants to continue Spravato administration because it is clearly helpful.  11/19/22 appt noted: Received Spravato 84 mg today as scheduled.  Tolerated it well  without nausea or vomiting headache or chest pain or palpitations.  Expected dissociation gradually resolved over the 2 hour observation period. She feels 50% less depressed with Spravto and wants to continue it.   Continues meds Adderall XR 20 mg every morning, Wellbutrin XL 450 every morning, clonidine 0.2 mg twice daily, lorazepam 1 mg every 8 hours as needed.  Trazodone 50 mg tablets 1-2 nightly as needed insomnia Has started nortriptyline 75 mg nightly for about 3 weeks and DT level 176, reduced to 50 mg HS early November. pramipexole increased to 0.75 mg twice daily off label for treatment resistant depression  She has felt better back on Spravato more regularly.  However she still has residual depression esp when alone or when wihtout activity.  Can function when needed.   Does not have her connfidence back. She plans to pursue [place] availability. Have discussed retrying [place]  11/28/22 appt noted: Received Spravato 84 mg today as scheduled.  Tolerated it well without nausea or vomiting headache or chest pain or palpitations.  Expected dissociation gradually resolved over the 2 hour observation period. She feels 50% less  depressed with Spravto and wants to continue it.   Continues meds Adderall XR 20 mg every morning, Wellbutrin XL 450 every morning, clonidine 0.2 mg twice daily, lorazepam 1 mg every 8 hours as needed.  Trazodone 50 mg tablets 1-2 nightly as needed insomnia Has started nortriptyline 75 mg nightly for about 3 weeks and DT level 176, reduced to 50 mg HS early November. pramipexole increased to 0.75 mg twice daily off label for treatment resistant depression  Last week she was more depressed than usual for reasons that are not clear.  She has not had a clear answer from [facility] [place] about [facility] options.  States that they have not returned her call.  She is asked about Auvelity retry in place of Wellbutrin.  Has still been walking.  12/03/22 appt noted: Received Spravato 84 mg today  as scheduled.  Tolerated it well without nausea or vomiting headache or chest pain or palpitations.  Expected dissociation gradually resolved over the 2 hour observation period. She feels 50% less depressed with Spravto and wants to continue it.   Continues meds Adderall XR 20 mg every morning, Wellbutrin XL 450 every morning, clonidine 0.2 mg twice daily, lorazepam 1 mg every 8 hours as needed.  Trazodone 50 mg tablets 1-2 nightly as needed insomnia started nortriptyline 75 mg nightly for about 3 weeks and DT level 176, reduced to 50 mg HS early November. pramipexole increased to 0.75 mg twice daily off label for treatment resistant depression  No SE .  Satisfied with meds. Depression is better in the last week.  Not sure why that is the case.  Still walking daily and that helps and journaling 3 pages daily.  Working on [employer], academic.  No changes desire.  Clearly benefits from Spravato but not 100%.  Still considering TMS.  12/10/22 appt noted: Received Spravato 84 mg today as scheduled.  Tolerated it well without nausea or vomiting headache or chest pain or palpitations.  Expected dissociation gradually resolved over the 2 hour observation period. She feels 50% less depressed with Spravto and wants to continue it.   Continues meds Adderall XR 20 mg every morning, Wellbutrin XL 450 every morning, clonidine 0.2 mg twice daily, lorazepam 1 mg every 8 hours as needed.  Trazodone 50 mg tablets 1-2 nightly as needed insomnia started nortriptyline 75 mg nightly for about 3 weeks and DT level 176, reduced to 50 mg HS early November. pramipexole increased to 0.75 mg twice daily off label for treatment resistant depression  No SE .   Depression was worse this week for no apparent reason.  She struggled being positive.  She has felt more discouraged.  She has felt more anxious.  She has had a hard time doing tasks.  She is interested in retrying the [place] which we had discussed previously. Plan: She agrees to  [facility].  To improve tolerability and reduce risk of side effects, Stop Wellbutrin and start Auvelity 1 in the morning for 1 week then 1 twice daily AndReduce pramipexole 0.5 mg BID and reduce nortriptyline to 50 mg HS  12/17/22 appt noted: Received Spravato 84 mg today as scheduled.  Tolerated it well without nausea or vomiting headache or chest pain or palpitations.  Expected dissociation gradually resolved over the 2 hour observation period. She feels 50% less depressed with Spravto and wants to continue it.   Continues meds Adderall XR 20 mg every morning, stopped Wellbutrin XL 150 every morning, clonidine 0.2 mg twice daily, lorazepam 1 mg every  8 hours as needed.  Trazodone 50 mg tablets 1-2 nightly as needed insomnia Has started nortriptyline 75 mg nightly for about 3 weeks and DT level 176, reduced to 50 mg HS early November. pramipexole 0.375 mg twice daily off label for treatment resistant depression with plan to stop Started Auvelity 1 AM Still depressed to moderate degree.  Tolerating Auvelity so far.  No other problems with meds.  Willing to give Auvelity a chance. Plan: She agrees to [facility].  Continue 1 AM and when tolerated then increase to BID Stop pramipexole.  12/26/22 received Spravato  01/01/23 appt noted: Received Spravato 84 mg today as scheduled.  Tolerated it well without nausea or vomiting headache or chest pain or palpitations.  Expected dissociation gradually resolved over the 2 hour observation period. She feels 50% less depressed with Spravto and wants to continue it.   Continues meds Adderall XR 20 mg every morning, stopped Wellbutrin XL 150 every morning, clonidine 0.2 mg twice daily, lorazepam 1 mg every 8 hours as needed.  Trazodone 50 mg tablets 1-2 nightly as needed insomnia Has started nortriptyline 75 mg nightly for about 3 weeks and DT level 176, reduced to 50 mg HS early November. Forgot to reduce pramipexole stoll taking  0.5 mg twice daily off  label for treatment resistant depression  Started Auvelity 1 AM & PM last week. Occ misses Spravato DT htn.this week a better than last.   Painting again more and it helps mood.  Confidence still low and not as likely to socialize as normal but enjoys family.   Still ambivalent about [place]. Tolerating meds.  Including the increase in [place].    01/08/23 appt noted: Received Spravato 84 mg today as scheduled.  Tolerated it well without nausea or vomiting headache or chest pain or palpitations.  Expected dissociation gradually resolved over the 2 hour observation period. She feels 50% less depressed with Spravto and wants to continue it.   Continues meds Adderall XR 20 mg every morning, stopped Wellbutrin XL 150 every morning, clonidine 0.2 mg twice daily, lorazepam 1 mg every 8 hours as needed.  Trazodone 50 mg tablets 1-2 nightly as needed insomnia Has started nortriptyline 75 mg nightly for about 3 weeks and DT level 176, reduced to 50 mg HS early November. reduced pramipexole to 0.5 mg daily off label for treatment resistant depression  Started Auvelity 1 AM & PM mid Feb. More depressed as week progresses.  Thinks it is worse with less pramipexole.  More negative.  Able to function but feels miserable.  No SI.  Hopeless.  Sleep ok.  Tolerating meds.   Talked to [facility] about [place] and appt mid March. Plan: increase pramipexole to 0.5 mg TID for dep bc seemed to worsen with the reduction.  01/15/23 appt noted: Received Spravato 84 mg today as scheduled.  Tolerated it well without nausea or vomiting headache or chest pain or palpitations.  Expected dissociation gradually resolved over the 2 hour observation period. She feels 50% less depressed with Spravto and wants to continue it.   Continues meds Adderall XR 20 mg every morning, stopped Wellbutrin XL 150 every morning, clonidine 0.2 mg twice daily, lorazepam 1 mg every 8 hours as needed.  Trazodone 50 mg tablets 1-2 nightly as needed  insomnia Has started nortriptyline 75 mg nightly for about 3 weeks and DT level 176, reduced to 50 mg HS early November. Increased pramipexole to 0.5 mg  to TID  off label for treatment resistant depression  Started Auvelity 1 AM & PM mid Feb. markedly better depression the increase in pramipexole.  She feels like her depression is almost resolved.  She is very pleased with the response.  She is tolerating the medications well  01/30/23 appt noted: Received Spravato 84 mg today as scheduled.  Tolerated it well without nausea or vomiting headache or chest pain or palpitations.  Expected dissociation gradually resolved over the 2 hour observation period. She feels 50% less depressed with Spravto and wants to continue it.   Continues meds Adderall XR 20 mg every morning, stopped Wellbutrin XL 150 every morning, clonidine 0.2 mg twice daily, lorazepam 1 mg every 8 hours as needed.  Trazodone 50 mg tablets 1-2 nightly as needed insomnia Has started nortriptyline 75 mg nightly for about 3 weeks and DT level 176, reduced to 50 mg HS early November. Increased pramipexole to 0.5 mg  to TID  off label for treatment resistant depression  Started Auvelity 1 AM & PM mid Feb. Mood markedly better with pramipexole added.  Nearly normal mood now with minimal depression.  More social and outgoing and motivated and resolved anhedonia. No SE.  Compliant.  02/04/23 appt noted: Continues meds Adderall XR 20 mg every morning, stopped Wellbutrin XL 150 every morning, clonidine 0.2 mg twice daily, lorazepam 1 mg every 8 hours as needed.  Trazodone 50 mg tablets 1-2 nightly as needed insomnia Has started nortriptyline 75 mg nightly for about 3 weeks and DT level 176, reduced to 50 mg HS early November. Increased pramipexole to 0.5 mg  to TID  off label for treatment resistant depression  Started Auvelity 1 AM & PM mid Feb. Received Spravato 84 mg today as scheduled.  Tolerated it well without nausea or vomiting headache  or chest pain or palpitations.  Expected dissociation gradually resolved over the 2 hour observation period. She feels 50% less depressed with Spravto and wants to continue it.  The pramipexole got her the rest of the way better.  Doesn't want to cut back on any tx bc afraid of relapse. Tolerating meds.  02/15/23 appt noted: Continues meds Adderall XR 20 mg every morning, stopped Wellbutrin XL 150 every morning, clonidine 0.2 mg twice daily, lorazepam 1 mg every 8 hours as needed.  Trazodone 50 mg tablets 1-2 nightly as needed insomnia Has started nortriptyline 75 mg nightly for about 3 weeks and DT level 176, reduced to 50 mg HS early November. Increased pramipexole to 0.5 mg  to TID  off label for treatment resistant depression  Started Auvelity 1 AM & PM mid Feb. Received Spravato 84 mg today as scheduled.  Tolerated it well without nausea or vomiting headache or chest pain or palpitations.  Expected dissociation gradually resolved over the 2 hour observation period. She feels no longer depressed.   The pramipexole got her the rest of the way better.  Doesn't want to cut back on any tx bc afraid of relapse. Tolerating meds.  02/18/23 appt noted: Continues meds Adderall XR 20 mg every morning, stopped Wellbutrin XL 150 every morning, clonidine 0.2 mg twice daily, lorazepam 1 mg every 8 hours as needed.  Trazodone 50 mg tablets 1-2 nightly as needed insomnia Has started nortriptyline 75 mg nightly for about 3 weeks and DT level 176, reduced to 50 mg HS early November. Increased pramipexole to 0.5 mg  to TID  off label for treatment resistant depression  Started Auvelity 1 AM & PM mid Feb. Received Spravato 84 mg today as scheduled.  Tolerated it well without nausea or vomiting headache or chest pain or palpitations.  Expected dissociation gradually resolved over the 2 hour observation period. She feels no longer depressed except when ran out of Auvelity this week DT lack of availability.  Much worse  without it..   The pramipexole got her the rest of the way better.  Doesn't want to cut back on any tx bc afraid of relapse. Tolerating meds.  02/25/23 appt: Received Spravato 84 mg today as scheduled.  Tolerated it well without nausea or vomiting headache or chest pain or palpitations.  Expected dissociation gradually resolved over the 2 hour observation period. Continues meds Adderall XR 20 mg every morning, stopped Wellbutrin XL 150 every morning, clonidine 0.2 mg twice daily, lorazepam 1 mg every 8 hours as needed.  Trazodone 50 mg tablets 1-2 nightly as needed insomnia Has started nortriptyline 75 mg nightly for about 3 weeks and DT level 176, reduced to 50 mg HS early November. Increased pramipexole to 0.5 mg  to TID  off label for treatment resistant depression  Started Auvelity 1 AM & PM mid Feb. No SE Still dramatically better with meds and Spravato.  Not 100% over depression. Had GS born last week and able to enjoy it now.  Is a little sleepy with pramipexole but manageable.  No change desired.  Sleep is ok.  03/04/23 appt noted: Received Spravato 84 mg today as scheduled.  Tolerated it well without nausea or vomiting headache or chest pain or palpitations.  Expected dissociation gradually resolved over the 2 hour observation period. Continues meds Adderall XR 20 mg every morning, stopped Wellbutrin XL 150 every morning, clonidine 0.2 mg twice daily, lorazepam 1 mg every 8 hours as needed.  Trazodone 50 mg tablets 1-2 nightly as needed insomnia nortriptyline reduced to 50 mg HS early November. Increased pramipexole to 0.5 mg  to TID  off label for treatment resistant depression  Started Auvelity 1 AM & PM mid Feb. Still generally doing well with meds and Spravato except when got tooth abscess.  Freels more depressed after dental surgery.   No SE with meds.   Sleep is OK.  No med changes desired  03/11/23 appt noted: Received Spravato 84 mg today as scheduled.  Tolerated it well  without nausea or vomiting headache or chest pain or palpitations.  Expected dissociation gradually resolved over the 2 hour observation period. Continues meds Adderall XR 20 mg every morning, stopped Wellbutrin XL 150 every morning, clonidine 0.2 mg twice daily, lorazepam 1 mg every 8 hours as needed.  Trazodone 50 mg tablets 1-2 nightly as needed insomnia nortriptyline reduced to 50 mg HS early November. Increased pramipexole to 0.5 mg  to TID  off label for treatment resistant depression  Started Auvelity 1 AM & PM mid Feb. Some increase depression with infection and needing amoxiciliin this week.  Otherwie still doing well with mood and meds. No med changes desired or indicated. No SE  03/21/2023 appointment noted: Continues meds Adderall XR 20 mg every morning, stopped Wellbutrin XL 150 every morning, clonidine 0.2 mg twice daily, lorazepam 1 mg every 8 hours as needed.  Trazodone 50 mg tablets 1-2 nightly as needed insomnia nortriptyline reduced to 50 mg HS early November. Increased pramipexole to 0.5 mg  to TID  off label for treatment resistant depression  Started Auvelity 1 AM & PM mid Feb. Depression is much better.  Pretty much resolved this week.  She feels the Auvelity and pramipexole have made the  biggest difference.  Obviously the Spravato is also helping significantly.  She wants to continue all of these medications.  She still has anxiety easily.  Especially facing something that she would rather avoid.  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 Tolerating meds without significant side effects.   ECT-MADRS    Flowsheet Row Clinical Support from 01/08/2023 in [facility] Clinical Support from 08/06/2022 in [facility] Clinical Support from 07/04/2022 in [facility] Clinical Support from 05/21/2022 in [facility]  [facility] Office Visit from 03/02/2022 in [facility]  MADRS Total Score 21 29 15 27  46        Past Psychiatric Medication Trials: fluoxetine, duloxetine, Viibryd, Pristiq, sertraline, citalopram,  Trintellix anxious and SI Wellbutrin XL 450 Auvelity 1 dose nortriptyline 75 mg nightly for about 3 weeks and DT level 176, reduced to 50 mg HS NR. Pramipexole 1 mg NR Adderall, Adderall XR, Vyvanse, Ritalin, Strattera low dose NR Lorazepam Trazodone  Depakote,  lamotrigine cog complaints Lithium remotely Abilify 7.5  Vraylar 1.5 mg daily agitation and insomnia Rexulti insomnia Latuda 40 one dose, CO anxious and SI Seroquel XR 300 Olanzapine 10  At visit November 12, 2019. We discussed Patient developed an increasingly severe alcohol dependence problem since her last visit in January.  She went to [facility] and has had no alcohol since then except 1 day.  She never abused stimulants but they took her off the stimulants at [facility].  Her ADD was markedly worse.  The Wellbutrin did not help the ADD.   D history lamotrigine rash at 67 yo  Review of Systems:  Review of Systems  Constitutional:  Negative for fatigue.  Musculoskeletal:  Positive for back pain. Negative for arthralgias and joint swelling.       SP hip surgery October 2020  Neurological:  Negative for dizziness and tremors.  Psychiatric/Behavioral:  Positive for decreased concentration and dysphoric mood. Negative for agitation, behavioral problems, confusion, hallucinations, self-injury, sleep disturbance and suicidal ideas. The patient is nervous/anxious. The patient is not hyperactive.        Forgetful at times about med recommendations.  Send present for him The only [place] she notices a it states it is unbelievable him how how much are her 3 steps around freedom of the present fall and just in the past decade  Medications: I have reviewed the patient's  current medications.  Current Outpatient Medications  Medication Sig Dispense Refill   amLODipine (NORVASC) 2.5 MG tablet Take 2.5 mg by mouth daily.     amphetamine-dextroamphetamine (ADDERALL XR) 20 MG 24 hr capsule Take 1 capsule (20 mg total) by mouth every morning. 30 capsule 0   [START ON 03/26/2023] amphetamine-dextroamphetamine (ADDERALL XR) 20 MG 24 hr capsule Take 1 capsule (20 mg total) by mouth every morning. 30 capsule 0   buPROPion (WELLBUTRIN XL) 150 MG 24 hr tablet TAKE 3 TABLETS BY MOUTH DAILY (Patient taking differently: Take 150 mg by mouth daily.) 270 tablet 1   cloNIDine (CATAPRES) 0.2 MG tablet TAKE 1 TABLET BY MOUTH 2 TIMES DAILY. 180 tablet 0   Dextromethorphan-buPROPion ER (AUVELITY) 45-105 MG TBCR Take 1 tablet by mouth 2 (two) times daily. 60 tablet 3   Esketamine HCl, 84 MG Dose, (SPRAVATO, 84 MG DOSE,) 28 MG/DEVICE SOPK USE 3 SPRAYS IN EACH NOSTRIL ONCE  A WEEK 3 each 1   iron polysaccharides (NIFEREX) 150 MG capsule TAKE 1 CAPSULE BY MOUTH EVERY DAY 90 capsule 1   LORazepam (ATIVAN) 1 MG tablet Take 1 tablet (1 mg total) by mouth every 8 (eight) hours as needed. for anxiety 90 tablet 1   losartan (COZAAR) 50 MG tablet Take 50 mg by mouth daily.     nebivolol (BYSTOLIC) 2.5 MG tablet Take 2.5 mg by mouth daily.     nortriptyline (PAMELOR) 25 MG capsule Take 2 capsules (50 mg total) by mouth at bedtime. 180 capsule 0   pramipexole (MIRAPEX) 0.5 MG tablet Take 1 tablet (0.5 mg total) by mouth 3 (three) times daily. 90 tablet 1   traZODone (DESYREL) 50 MG tablet TAKE 1-2 TABLETS BY MOUTH NIGHTLY AS NEEDED FOR SLEEP 180 tablet 1   No current facility-administered medications for this visit.    Medication Side Effects: None  Allergies:  Allergies  Allergen Reactions   Metronidazole Shortness Of Breath and Other (See Comments)    Heart pounding   Ferrlecit [Na Ferric Gluc Cplx In Sucrose] Other (See Comments)    Infusion reaction 05/12/2019    Past Medical  History:  Diagnosis Date   ADHD    Anemia    Anxiety    Arthritis    Depression    Heart murmur    i went to see a cardiologit slast eyar  and i had zero plaque,    PONV (postoperative nausea and vomiting)    Recovering alcoholic in remission (HCC)     Family History  Problem Relation Age of Onset   Atrial fibrillation Mother    CAD Father     Past Medical History, Surgical history, Social history, and Family history were reviewed and updated as appropriate.   Please see review of systems for further details on the patient's review from today.   Objective:   Physical Exam:  There were no vitals taken for this visit.  Physical Exam Constitutional:      General: She is not in acute distress. Neurological:     Mental Status: She is alert and oriented to person, place, and time.     Coordination: Coordination normal.     Gait: Gait normal.  Psychiatric:        Attention and Perception: Attention and perception normal.        Mood and Affect: Mood is anxious. Mood is not depressed. Affect is not tearful.        Speech: Speech is not rapid and pressured or slurred.        Behavior: Behavior is not slowed.        Thought Content: Thought content is not paranoid or delusional. Thought content does not include homicidal or suicidal ideation. Thought content does not include suicidal plan.        Cognition and Memory: Cognition normal. Memory is not impaired.        Judgment: Judgment normal.     Comments: Insight intact. No auditory or visual hallucinations. No delusions.  Depression nearly resolved with pramipexole TID and relapsed when out of Auvelity.     Lab Review:     Component Value Date/Time   NA 137 01/12/2021 1430   NA 140 11/18/2018 1544   K 3.8 01/12/2021 1430   CL 108 01/12/2021 1430   CO2 22 01/12/2021 1430   GLUCOSE 94 01/12/2021 1430   BUN 14 01/12/2021 1430   BUN 20 11/18/2018 1544   CREATININE 0.82  01/12/2021 1430   CALCIUM 8.9 01/12/2021 1430    PROT 6.6 01/12/2021 1430   ALBUMIN 3.9 01/12/2021 1430   AST 12 (L) 01/12/2021 1430   ALT 11 01/12/2021 1430   ALKPHOS 46 01/12/2021 1430   BILITOT 0.5 01/12/2021 1430   GFRNONAA >60 01/12/2021 1430   GFRAA >60 09/02/2019 0249   GFRAA >60 01/27/2019 0811       Component Value Date/Time   WBC 4.5 01/12/2021 1430   RBC 4.32 01/12/2021 1430   HGB 12.8 01/12/2021 1430   HGB 12.9 07/17/2019 0953   HCT 38.5 01/12/2021 1430   HCT 21.9 (L) 12/25/2018 1221   PLT 272 01/12/2021 1430   PLT 286 07/17/2019 0953   MCV 89.1 01/12/2021 1430   MCH 29.6 01/12/2021 1430   MCHC 33.2 01/12/2021 1430   RDW 12.4 01/12/2021 1430   LYMPHSABS 1.4 01/12/2021 1430   MONOABS 0.4 01/12/2021 1430   EOSABS 0.0 01/12/2021 1430   BASOSABS 0.0 01/12/2021 1430    No results found for: "POCLITH", "LITHIUM"   No results found for: "PHENYTOIN", "PHENOBARB", "VALPROATE", "CBMZ"   .res Assessment: Plan:    Recurrent major depression resistant to treatment (HCC)  Generalized anxiety disorder  Attention deficit hyperactivity disorder (ADHD), predominantly inattentive type  Insomnia due to mental condition  Accelerated hypertension   She has treatment resistant major depression ongoing with 50% better with Spravato. Have  discussed some of her  abnormal behaviors last year leading to this depressive episode getting worse which she says were associated with heavy use of delta 8 and not a manic episode.  She realizes now that that was not good for her.  She stopped all use of other drugs including those available over-the-counter such as delta 8 or any other THC related products.  She is no longer having any of those types of behaviors and instead is depressed.  She is experiencing more pleasure and is less blunted and enjoying more and better inteest versus before the Spravato.  She also feels worse if misses a dose of Spravato.  Depression resolved recently after increasing pramipexole 0.5 mg TID, brief  relapse when out of Auvelity Consider switch to ER to reduce sleepiness  Patient was administered Spravato 84 mg intranasally today.  The patient experienced the typical dissociation which gradually resolved over the 2-hour period of observation.  There were no complications.  Specifically the patient did not have nausea or vomiting or headache.  Blood pressures monitored at the 40-minute and 2-hour follow-up intervals.  Borderline high.  By the time the 2-hour observation period was met the patient was alert and oriented and able to exit without assistance.  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  See nursing note for further details.She wants to continue Spravato. We discussed discussed the side effects in detail as well as the protocol required to receive Spravato.   Failed multiple antidepressants.  Many of them were not actual failures but intolerances and it is unclear whether some of that was more connected with anxiety than true side effects.  1 example is the [place].  In general she does not want to try anything but an antidepressant but has failed all major categories of antidepressants except TCAs and MAO inhibitors which have not been tried until now.  Started nortriptyline 75 mg nightly.  Serum level 176.  So Reduced to 50 mg HS. Consider stopping it.  Discussed side effects in  detail.  Needs more time to help. We discussed the possibility of trying to wean off nortriptyline in the hopes that she can maintain an adequate response with the Auvelity plus pramipexole and Spravato.  We will discuss it again and she prefers not to make the change today.  She is tolerating Auvelity.  Continue 1  BID bc more depressed without it.  Worse with less pramipexole. Increased to 0.5 mg TID on about end of Feb 2024 and markedly better Dosing range off label for depression ranges from 1-5 mg daily.  Disc risk  compulsive impulsive behavior and mania.    Started Spravato 84 mg twice weekly on 03/16/2022.  Now on weekly administration  Adderall  XR 20 mg AM   Ativan 1 mg 3 times daily as needed anxiety but try to cut it back.  She still feels she needs it. Is not ideal to use benzodiazepine with stimulant but because of the severity of her symptoms it has been necessary.  Hope to eventually eliminate the benzodiazepine.  Expected as her depression improves her anxiety will improve as well.   We will expect that to improve as the depression improves.  She has headed insturctions to reduce this.  Continue clonidine 0.2 mg BID off label for anxiety and helps BP partially. BP is better controlled but not consistent.  Consider increasing amlodipine.  Also on losartan 50  Discussed potential benefits, risks, and side effects of stimulants with patient to include increased heart rate, palpitations, insomnia, increased anxiety, increased irritability, or decreased appetite.  Instructed patient to contact office if experiencing any significant tolerability issues. She wants to return to usual dose of Adderall for ADD bc of mor poor cognitive function with reduction.  Also discussed that depression will impair cognitive function.  Disc risk polypharmacy.  Reevaluate meds after better with pramipexole.  Relapsed after running out of Auvelity so needs clearly it and pramipexole.  Give it more time.  But consider trying to wean nortriptyline in hopes she can do as well with less medication.  Rec keep track of BP and discuss with PCP. It has been up and down.  Sometimes needs extra clonidine before Spravato  Has Maintained sobriety  No med changes indicated today: Continue Adderall XR 20 mg every morning Continue Wellbutrin XL 150 every morning Continue Auvelity 1 tablet twice daily,  continue clonidine 0.2 mg twice daily for anxiety and blood pressure. Continue lorazepam 1 mg 3 times daily she has been unable to  cut back on the dose due to anxiety. Continue nortriptyline 50 mg nightly Continue pramipexole 0.5 mg 3 times daily Continue trazodone 50 mg tablets 1-2 nightly as needed for sleep.  FU with Spravato weekly   Meredith Staggers, MD, DFAPA     Please see After Visit Summary for patient specific instructions.  No future appointments.                  No orders of the defined types were placed in this encounter.      -------------------------------

## 2023-03-21 NOTE — Progress Notes (Signed)
NURSES NOTE:   Patient arrived for her weekly 84mg  Spravato treatment. Pt is being treated for Treatment Resistant Depression this is #59 treatment, pt will be receiving 84 mg which will continue to be her maintenance dose, she receives weekly treatments.  Patient arrived and taken to treatment room. Confirmed she had a ride home which is her husband would be coming back to pick up pt when done and sometimes she needs to use [facility] Goad if he is unable to pick her up. Pt's Spravato is ordered through [employer] and delivered to office, all Spravato medication is stored at doctors office per REMS/FDA guidelines. The medication is required to be locked behind two doors per FDA/REMS Protocol. Medication is also disposed of properly per regulations. Pt's prior authorization for Spravato 84 mg was renewed with an approval through 07/17/2023 with [facility]. All treatments with vital signs documented with Spravato REMS per protocol of being a treatment center.    Pt reports feeling very good, she has a new grand baby, she is painting more. Unfortunately her Mother fell this past weekend so she has been having to care for her a bit more than usual. Began taking patient's vital signs at 9:49 AM 124/83, pulse 62. Pulse Ox 99%. Gave patient first dose 28 mg nasal spray, each nasal spray administered in each nostril as directed and waited 5 minutes between the second and third dose. All 3 doses given pt did not complain of any nausea/vomiting, given a cup of water due to the taste after the administration of Spravato.  She listens to [employer] with spa or relaxing music. Pt is not as sedate as she was last week and her vital signs are stable. She reports her PCP increasing one of her medications to help her B/P. , checked 40 minute vitals at 10:32 AM, 110/72, pulse 70, Pulse Ox 92%. Explained she would be monitored for a total time of 120 minutes. Discharge vitals were taken at 12:05 PM 102/81 P 62, 99% Pulse Ox. Dr. Jennelle Human  met with pt today and discussed her medication. I walked pt to elevator, where her husband met her for the ride home. Recommend she go home and sleep or just relax on the couch. No driving, no intense activities. Verbalized understanding. Nurse was with pt a total of 70 minutes for clinical. Pt will be scheduled next Tuesday, May 14th. Pt instructed to call office with any problems or questions.      LOT [id number] EXP JAN 2027

## 2023-03-26 ENCOUNTER — Ambulatory Visit: Payer: 59

## 2023-03-26 ENCOUNTER — Ambulatory Visit (INDEPENDENT_AMBULATORY_CARE_PROVIDER_SITE_OTHER): Payer: 59 | Admitting: Psychiatry

## 2023-03-26 VITALS — BP 106/80 | HR 73

## 2023-03-26 DIAGNOSIS — F411 Generalized anxiety disorder: Secondary | ICD-10-CM | POA: Diagnosis not present

## 2023-03-26 DIAGNOSIS — F339 Major depressive disorder, recurrent, unspecified: Secondary | ICD-10-CM

## 2023-03-26 DIAGNOSIS — F5105 Insomnia due to other mental disorder: Secondary | ICD-10-CM | POA: Diagnosis not present

## 2023-03-26 DIAGNOSIS — F900 Attention-deficit hyperactivity disorder, predominantly inattentive type: Secondary | ICD-10-CM | POA: Diagnosis not present

## 2023-03-26 NOTE — Progress Notes (Signed)
NURSES NOTE:   Patient arrived for her weekly 84mg  Spravato treatment. Pt is being treated for Treatment Resistant Depression this is #60 treatment, pt will be receiving 84 mg which will continue to be her maintenance dose, she receives weekly treatments.  Patient arrived and taken to treatment room. Confirmed she had a ride home which is her husband would be coming back to pick up pt when done and sometimes she needs to use [facility] if he is unable to pick her up. Pt's Spravato is ordered through [employer] and delivered to office, all Spravato medication is stored at doctors office per REMS/FDA guidelines. The medication is required to be locked behind two doors per FDA/REMS Protocol. Medication is also disposed of properly per regulations. Pt's prior authorization for Spravato 84 mg was renewed with an approval through 07/17/2023 with [facility]. All treatments with vital signs documented with Spravato REMS per protocol of being a treatment center.    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 Unfortunately her Mother fell this past weekend so she has been having to care for her a bit more than usual. Began taking patient's vital signs at 9:45 AM 104/74, pulse 69. Pulse Ox 99%. Gave patient first dose 28 mg nasal spray, each nasal spray administered in each nostril as directed and waited 5 minutes between the second and third dose. All 3 doses given pt did not complain of any nausea/vomiting, given a cup of water due to the taste after the administration of Spravato.  She listens to [employer] with spa or relaxing music. Pt is not as sedate as she was last week and her vital signs are stable. She reports her PCP increasing one of her medications to help her B/P. , checked 40 minute vitals at 10:30 AM, 105/64, pulse 71, Pulse Ox 92%. Explained she would be monitored for a total time of 120 minutes. Discharge vitals were taken at 11:45 AM 106/80  P 73, 99% Pulse Ox. Dr. Jennelle Human met with pt today and discussed her medication. I walked pt to elevator, where her husband met her for the ride home. Recommend she go home and sleep or just relax on the couch. No driving, no intense activities. Verbalized understanding. Nurse was with pt a total of 70 minutes for clinical. Pt will be scheduled next Monday, May 20th. Pt instructed to call office with any problems or questions.      LOT [id number] EXP JAN 2027

## 2023-03-29 ENCOUNTER — Encounter: Payer: Self-pay | Admitting: Psychiatry

## 2023-03-29 NOTE — Progress Notes (Signed)
[name] [id number] October 11, 1956 68 y.o.  Subjective:   Patient ID:  Laura Chang is a 67 y.o. (DOB 31-Jul-1956) female.  Chief Complaint:  Chief Complaint  Patient presents with   Follow-up   Depression   Anxiety   ADD   Sleeping Problem      Laura Chang presents to the office today for follow-up of depression and anxiety and ADD.  seen November 12, 2019.  Melted down in 2020.  Went to [facility] in July.  No withdrawal.  1 drink since then.  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 She was on no stimulant and no SSRI but was taking Strattera and Wellbutrin.  The following changes were made. Stop Strattera. OK restart stimulant bc severe ADD Restart Adderall 1 daily for a few days and if tolerated then restart 1 twice daily. If not tolerated reduce the dosage if needed. May need to stop Wellbutrin if not tolerating the stimulant.  Yes.  DC Wellbutrin Restart Prozac 20 mg daily.  February 2021 appointment with the following noted: Completed grant proposal.  Couldn't doit without Adderall.  Sold a bunch of work.   Adderall XR lasts about 3 pm.  Strength seems about right.  BP been OK.  Not jittery.   Stopped Wellbutrin but had no SE. Mood drastically better with grant proposal and back on fluoxetine.  Less depressed and lethargic.  No anxiety.  Cut back on coffee. Started back with devotions and stronger faith. Plan: Continue Prozac 20 mg daily. May have to increase the dose at some point in the future given that she usually was taking higher dosages but she is getting good response at this time. Restart Wellbutrin off label for ADD since can't get 2 ADDERALL daily. 150 mg daily then 300 mg daily. She can adjust the dose between 150 mg and 300 mg daily to get the optimal effect.   05/11/2020 appointment with the following noted: Has been inconsistent with Prozac and Wellbutrin. Not sure of the effect of Wellbutrin. Biggest deterrent in  work is anxiety.  Some of the work is conceptual and difficult at times.  Can feel she's not up to a project at times.  Overall is OK but would like a steadier benefit from stimulant.  Exhausted from managing concentration and keeping up with things from the day.  Loses things.  Not good keeping up with schedule. Overall productive and emotionally OK. Can feel Adderall wear off. Mood is better in summer and worse in the winter.   F died in 01-Nov-2023 and that is a loss. No SE Wellbutrin. Still attends AA meetings.  Real benefit from [facility] last year. Recognizes effect of anemia on ADD and mood.  Had iron infusions last winter. Plan:  Wellbutrin off label for ADD since can't get 2 ADDERALL daily. 150 mg daily then 300 mg daily.  01/24/2021 appointment with following noted: Doing a program called [employer] mindfulness app since Xmas.  CBT app helped the depression.  App helped her focus better.  Lost sign weight. Writing a lot. Before Xmas felt depressed and started negative thinking worse, self denigrating. Not drinking. More isolated.   Recognizes mo is narcissist.    Didn't tell anyone she was born until 3 mos later.  M aloof and uninterested in pt.  Lied about her birthday.  Mo lack of affection even with pt's kids. Going to AA for a year and it helped her to quit drinking. Also  misses kids being gone with a hole also. Plan: No med changes  05/04/2021 appointment with the following noted: Therapist [name] thinks she's manic. Lost weight to 144#.   States she is still sleeping okay.  Admits she is hyper and recognizes that she is likely manic.  She feels great, euphoric with an increased sense of spiritual connectedness to God.  She has racing thoughts and talks fast and talks a lot and this is noted by her husband.  He thinks she is a bit hyper.  She has been able to maintain sobriety although she will have 1 glass of wine on special occasions but does not drink by herself.  She  is not drinking to excess.  She denies any dangerous impulsivity.  She is clearly not depressed and not particularly anxious.  She has no concerns about her medication and she has been compliant.  06/16/21 appt noted: So much better.  Going through a lot but the manic thing happened on top of it.  So much slower.  Didn't feel like losing anything with risperidone.  Likes the Adderalll at 10 mg. Some drowsiness in the AM and very drowsy from risperidone 2 mg HS. Prayer life is better. Handling stress better. Less depressed with risperidone. Still likes trazodone. Sleeps well. Plan: Reduce Prozac to 10 mg daily.  Consider stopping it because it can feel the mania however she is reluctant to do that because she fears relapse of depression. Reduce risperidone to 1.5 mg nightly due to side effects.  Discussed risk of worsening mania.  07/25/2021 appointment with the following noted: Misses the Adderall and hard to function without it. Depressed now. Heavy chest.  Anxious and guilty.  Body feels heavy.   Hates Wellbutrin.   Plan: Increase fluoxetine to 20 mg daily Add Abilify 1/2 of 15 mg tablet daily Wean wellbutrin by 1 tablet each week  bc she feels it is not helpful and DT polypharmacy Reduce risperidone to 1 daily for 1 week and stop it. Disc risk of mania. Increase Adderall to XR 20 mg AM  08/08/21 Much less depressed and starting to feel normal I feel a lot better. No SE.  Speech normal off risperidone. Sleeping OK on trazaodone and enough.   Noticed benefit from Adderall again. Plan: continue fluoxetine to 20 mg daily Continue Abilify 1/2 of 15 mg tablet daily for depression and mania continue Adderall to XR 20 mg AM  10/10/2021 phone call: Pt stated she feels like the Abilify should be decreased to 5mg .She said she is depressed but rational and not suicidal.She has an appt Monday and can wait until then if you prefer. MD response: Reduce the Abilify to 7.5 mg every other day.  We will  meet on 10/16/2021 and decide what to do from there.  10/16/2021 appointment with the following noted: More depressed.  Most depressed I've ever been.  Just numb.  Sense of grief.   Thinks the manic episode was unlike anything else she ever had.  Doesn't want to medicate against it.  Don't enjoy people.  Easily overwhelmed.  Had some death thoughts but not suicidal.  Has been functional.  Feels better today after reducing Abilify to every other day but she is only been doing that for 3 days. A/P: Episode of post manic depression was explained. continue fluoxetine to 20 mg daily Hold Abilify for 1 week then resume Abilify 1/2 of 15 mg tablet every other day for depression and mania continue Adderall to XR 20  mg AM  10/27/2021 appointment with the following noted: I'm doing so much better.  Handling the depression better. Better self talk and spiritual focus has helped.   Dep 6/10 manifesting as anxiety with low confidence.   F died 2  years ago and M 67 yo and is dependent . She is working hard to feel better but still feels depressed.  She almost feels like she has a little more anxiety since restarting Abilify every other day. Plan: continue fluoxetine to 20 mg daily DC Abilify .  Vrayalar 1.5 mg QOD to try to get rid of depression ASAP. continue Adderall to XR 20 mg AM  11/10/2021 appointment with the following noted: Busy with Xmas and it was fun with family but then a big let down.  Did well with it.  Functioned well with it.  Working hard on things with depression.  Not shutting down. Not sure but feels better today but yesterday was hard.  Difficulty dealing with mother.  She won't do anything to help herself.  Yesterday with her all day.  Won't do PT and has isolated herself.    Lack of confidence.   No SE with Vraylar.  11/24/21 urgent appointment appt noted: More and more depressed.   So anxious and doesn't want to be alone but can do so. No appetite. Hurts inside. Has had some  fleeting suicidal thoughts but would not act on them.  Tolerating meds. Has been consistent with Vraylar 1.5 mg every other day, fluoxetine 20 mg daily Plan: Increase Vraylar to 1.5 mg daily Change Prozac to Trintellix 10 mg daily. Discussed side effects of each continue Adderall to XR 20 mg AM  12/27/2021 appointment with the following noted: Not OK.  I feel less depressed but feels bat shit. Not sleeping well.  Extremely anxious. Off and on sleep. 3-4 hours of sleep.   Still having daily SI.  But also become obvious has so much to do.  Overwhelmed by tasks.   Needs anxiety meds to just function. Not more motivated.  Walked yesterday.   Feels afraid like in trouble but not irritable or angry. DC DT agitation Vraylar to 1.5 mg daily Change Prozac to Trintellix 10 mg daily. Hold Adderall to XR 20 mg AM Clonidine 0.1 1/2 tablet twice daily for 2 days and if needed for anxiety and sleep increase to 1 twice daily Ok temporary Ativan 1 mg 3 times daily as needed anxiety  01/05/22 appt noted: Off fluoxetine and  Trintellix.  Only on Ativan, trazodone and Adderall XR 20 plus added clonidine 0.1 mg BID Didn't think she needed to start Trintellix. Not taking Ativan.   Didn't like herself last week. Feels some better today. Wonders if the manic sx Not agitated.  Anxiety kind of calmed down.  A lot to be anxious about situationally.  $ stress. Concerns about downers with meds. Can't access normal personality. ? Lethargy and inability to talk as sE. Plan: Latuda 20-40 mg daily with food. Adderall to XR 20 mg AM Clonidine 0.1 1/2 tablet twice daily  reduce dose to be sure no SE Ok temporary Ativan 1 mg 3 times daily as needed anxiety  01/19/22 appt noted: Taking Latuda 20 mg daily.  Took 40 mg once and felt anxious and  SI Still depressed and not very reactive Anxiety mainly about the depression and fears of the future. She wants to revisit manic sx and thinks it was maybe bc taking delta 8  bc was taking a lot  of it so still doesn't think she's classic bipolar. She wants to only take Prozac bc thinks Latuda is perpetuating depression. Says the delta 8 was very psychaedelic.  When not taking it was not manic.  Sleeping ok again.  Plan: Per her request DC Latuda 20-40 mg daily with food. She wants to continue Prozac alone AMA  Adderall to XR 20 mg AM Clonidine 0.1 1/2 tablet twice daily  reduce dose to be sure no SE Ok temporary Ativan 1 mg 3 times daily as needed anxiety  01/23/2022 phone call complaining of increased anxiety since stopping Latuda.  She will try increasing clonidine.  01/26/2022 phone call not feeling well and wanted to restart the Vraylar.  However notes indicate that had made her agitated therefore she was encouraged to pick up samples of Rexulti 1 mg and start that instead.  02/06/2022 phone call: Stating she felt the Rexulti was helping with depression but she was not sleeping well and obsessing over things.  She was encouraged to increase Rexulti to 2 mg daily and increase trazodone for sleep.  02/09/2022 appointment with the following noted: This was an urgent work in appointment No sleep last night with trazodone 100 mg HS Nothing really better depression or anxiety. Ruminating negative anxious thoughts. Did not tolerate Rexulti because it was causing insomnia.  Does not think it helped depression.  Lacks emotion that she should have.  Lacks her usual personality.  Some hopeless thoughts.  Some death thoughts.  Some suicidal thoughts without plan or intent Plan: DC Rexulti and Prozac & DC trazodone Adderall to XR 20 mg AM Clonidine 0.1 1/tablet twice daily  reduce dose to be sure no SE Ok temporary Ativan 1 mg 3 times daily as needed anxiety Start Seroquel XR 150 mg nightly  03/02/2022 appointment: Angelique Blonder called back a few days after starting Seroquel stating it was making her more anxious and more depressed.  This seemed unlikely as this medicine rarely  ever causes anxiety.  She stopped the medication waited 3 days and called back still had anxiety and depression but thought perhaps the anxiety was a little better.  She did not want to take the Seroquel. She knew about the option of Spravato and wanted to pursue that. Now questions whether to return to Seroquel while waiting to start Spravato bc feels just as bad without it and knows she didn't give it enough time to work.   MADRS 46  ECT-MADRS    Flowsheet Row Clinical Support from 01/08/2023 in [facility] Clinical Support from 08/06/2022 in [facility] Clinical Support from 07/04/2022 in [facility] Clinical Support from 05/21/2022 in [facility] Office Visit from 03/02/2022 in [facility]  MADRS Total Score 21 29 15 27  46      03/14/22 appt noted: Pt received Spravato 56 mg first dose today with some dissociative sx which were not severe.  She was anxious prior to the administration and felt better after receiving lorazepam 1 mg.  No NV, or HA. Wants to continue Spravato. Ongoing depression and desperate to feel better.  I'm not myself DT deprsssion which is most severe in recent history.  Anhedonia.  Low motivation.  Social avoidance. Continues to think all recent med trials are making her worse.  Sleep ok with Seroquel.  03/16/22 appt noted: Received Spravato 84 mg for the first time.  some dissociative sx which were not severe.  She  was anxious prior to the administration and felt better after receiving lorazepam 1 mg.  No NV, or HA. Wants to continue Spravato.   Does not feel any better or different since the last appt.  Ongoing depression.  Ongoing depression and desperate to feel better.  I'm not myself DT deprsssion which is most severe in recent history.  Anhedonia.  Low motivation.  Social avoidance. Continues to think all recent med trials are  making her worse.  Sleep ok with Seroquel.  Does not want to continue Seroquel for TRD.  03/20/2022 appointment noted: Came for Spravato administration today.  However blood pressure was significantly elevated approximately 180/115.  She was given lorazepam 1 mg and clonidine 0.2 mg to try to get it down. She states she regretted stopping the Seroquel XR 300 mg tablets.  She now realizes it was helpful.  She did not sleep much at all last night.  She did not take the Adderall this morning. 2 to 3 hours after arrival blood pressure was still elevated at  170/110, 62 pulse.  For Spravato administration was canceled for today.  She admits to being anxious and depressed.  She is not suicidal.  She is highly motivated to receive the Spravato.  We discussed getting it tomorrow.  03/22/2022 appointment noted: Patient's blood pressure was never stable enough yesterday in order to get her in for Spravato administration.  She was encouraged to see her primary care doctor.  It is better today.  03/26/2022 appointment with the following noted: Blood pressure was better.  Saw her primary care doctor who started on oral Bystolic 2.5 mg daily. Received Spravato 84 mg today as scheduled.  Tolerated it well without nausea or vomiting headache or chest pain or palpitations.  Her blood pressure was borderline but manageable. She remains depressed and anxious.  She is ambivalent about the medicine and desperate to get to feel better.  Continues to have anhedonia and low energy and low motivation and reduced ability to do things.  Less social.  Not suicidal.  03/28/22 appt noted: Received Spravato 84 mg today as scheduled.  Tolerated it well without nausea or vomiting headache or chest pain or palpitations.  Her blood pressure was borderline but manageable. Has not seen any improvement so far.  Tolerating Seroquel.  Inconsistent with Bystolic and BP has been borderline high. Still depressed and anxious and anhedonia.  Low  motivation, energy, productivity. Taking quetiapine and tolerating XR 300 mg nightly.  04/04/22 appt noted: Received Spravato 84 mg today as scheduled.  Tolerated it well without nausea or vomiting headache or chest pain or palpitations.  Her blood pressure was borderline but manageable. Has not seen any improvement so far.  Tolerating Seroquel.   She still tends to think that the medications are making her worse.  She has said this about each of the recent psychiatric medicines including Seroquel.  However her husband thinks she is improved.  She also admits there is some improvement in productivity.  She still feels highly anxious.  She still does not enjoy things as normal.  She still feels desperate to improve as soon as possible. Has been taking Seroquel XR since 03/20/2022  04/10/22 appt noted: Received Spravato 84 mg today as scheduled.  Tolerated it well without nausea or vomiting headache or chest pain or palpitations.  Her blood pressure was borderline but manageable. Has not seen any improvement so far.  Tolerating Seroquel.  Doesn't like Seroquel bc she thinks it flattens here. 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  Tolerated it well without nausea or vomiting headache or chest pain or palpitations.  Her blood pressure was borderline but manageable. Has not seen any improvement so far.  Tolerating Seroquel.  Doesn't like Seroquel bc she thinks it flattens her. Received Spravato 84 mg today as scheduled.  Tolerated it well without nausea or vomiting headache or chest pain or palpitations.  Her blood pressure was borderline but manageable. Has not seen any improvement so far.  Tolerating Seroquel.  Doesn't like Seroquel bc she thinks it flattens here.  We discussed her ambivalence about it. She is starting Auvelity  and has tolerated it the last 2 days without side effect.  She still does not feel like herself and feels flat and not enjoying things with suppressed expressed emotion  04/17/2022 appointment with the following noted: Received Spravato 84 mg today as scheduled.  Tolerated it well without nausea or vomiting headache or chest pain or palpitations.  Her blood pressure was borderline but manageable. Has not seen any improvement so far.  Tolerating Seroquel.  Doesn't like Seroquel bc she thinks it flattens her. She has been tolerating the Auvelity 1 in the morning without side effects for about a week.  She has not noticed significant improvement so far.  She still feels depressed and flat and not herself.  Other people notice that she is flat emotionally.  She is not suicidal.  She does feel discouraged that she is not getting better yet.  04/19/2022 appointment noted: Has increased Auvelity to 1 twice daily for 2 days, continues quetiapine XR 300 mg nightly, clonidine 0.3 mg twice daily, lorazepam 1 mg twice daily for anxiety and Adderall XR 20 mg in the morning. No obious SE but she still thinks quetiapine XR is making her feel down.  But not sedated Received Spravato 84 mg today as scheduled.  Tolerated it well without nausea or vomiting headache or chest pain or palpitations.  Her blood pressure was borderline but manageable. She still feels quite anxious and feels it necessary to take both the clonidine and lorazepam twice a day to manage her anxiety.  She has been consistently down and flat and not herself until yesterday afternoon she noted an improvement in mood and feeling much more like herself with her normal personality reemerging.  She was quite depressed in the morning with very dark negative thoughts.  She did not have those dark negative thoughts this morning.  She had a lot of questions about medication and when she was expecting to be improved and why she has not shown improvement up to  now.  04/23/22 appt noted: Has increased Auvelity to 1 twice daily for 1 week, continues quetiapine XR 300 mg nightly, clonidine 0.3 mg twice daily, lorazepam 1 mg twice daily for anxiety and Adderall XR 20 mg in the morning. No obious SE but she still thinks quetiapine XR is making her feel down.  But not sedated Received Spravato 84 mg today as scheduled.  Tolerated it well without nausea or vomiting headache or chest pain or palpitations.  She is still depressed but admits better function and is able to enjoy social interactions. Tolerating meds.  Would like to feel better for sure. Not herself.  Flat. Plan increase Auvelity to 1 tab BID as planned and reduce Quetiapine to 1/2 of ER 300 mg  bc  NR for depression.  04/25/2022 appointment with the following noted: clonidine 0.3 mg twice daily, lorazepam 1 mg twice daily for anxiety and Adderall XR 20 mg in the morning. Seroquel XR 300 HS No obious SE but she still thinks quetiapine XR is making her feel down.  But not sedated Received Spravato 84 mg today as scheduled.  Tolerated it well without nausea or vomiting headache or chest pain or palpitations.  Called yesterday with more anxiety.  Had increased Auvelity for 1 day and reduced Seroquel XR for 1 day.  Felt restless and fearful  05/01/2022 appointment noted: clonidine 0.3 mg twice daily, lorazepam 1 mg twice daily for anxiety and Adderall XR 20 mg in the morning. Seroquel XR 150 HS, Auvelity 1 BID Received Spravato 84 mg today as scheduled.  Tolerated it well without nausea or vomiting headache or chest pain or palpitations.  Nurse has noted patient has called multiple times sometimes asking the same question repeatedly.  It is unclear whether she is truly forgetful or is just anxious seeking reassurance. Patient acknowledges ongoing depression as well as some anxiety but states she has felt a little better in the last couple of days.  She has reduced the Seroquel to 150 mg at night and has  increased Auvelity to 1 twice daily but only for 1 day.  So far she seems to be tolerating it.  05/03/22 appt noted: clonidine 0.2 mg twice daily, lorazepam 1 mg twice daily for anxiety and Adderall XR 20 mg in the morning. Seroquel XR 150 HS, Auvelity 1 BID BP high this am about 170/100 and received extra clonidine 0.2 mg and came to receive Spravato.  Not dizzy, no SOB, nor CP but BP is still high Could not receive Spravato today bc BP high and pulse low at 30 ppm. Still depressed and anxious. Plan: continue trial Auvelity with Spravato She needs to get BP and pulse managed  05/08/22 TC: RTC  H Michael NA and mailbox full.  Could not leave message.  Pt  -  talked to she and H on speaker. H worried over wife.  Vacant stare.  Slurs words at times.  Not smiling. Reduced enjoyment.  Depression.  Withdrawn from usual activities.  Some irritability.  Anxious. Disc her concerns meds are making her worse.  Extensive discussion about her treatment resistant status.  There is a consistent pattern of not taking the medicines long enough to get benefit because she believes the meds are making her worse.  However the symptoms she describes as side effects are exactly the same symptoms that she had prior to taking the medication RX for  the depression.  So it is not clear that these are actual side effects. This is true about the 2 most recent meds including Seroquel and Auvelity.  Recommend psychiatric consultation in hopes of improving her comfort level with taking prescribed medications for a sufficient length of time to provide benefit. Extensive discussion about ECT is the treatment of choice for treatment resistant depression.  Spravato may work if she can comply with consistency.  There are medication options but they take longer to work.   Plan:  Reduce clonidine to 0.1 mg BID DT bradycardia.  Talk with PCP about BP and low pulse problems which are interfering with her consistent compliance with  Spravato.   Limit lorazepam to 3 -4  mg daily max. Excess use is the cause of slurring speech.  She must stop excess use or will have to stop the  med. Stop Auvelity per her request.  But she has only been on the full dose for a little over a week and clearly has not had time to get benefit from it.  She thinks maybe it is making her more anxious. Reduce Seroquel from 150XR to 50 -100 mg at night IR.  She couldn't sleep when stopped it completely. Will not start new antidepressant until her SE issues are resolved or not. Get second psych opinion from Wellington Hampshire MD or another psychiatrist.  H's sister is therapist in [place] Rink, MD, [facility]  05/16/2022 appointment with the following noted: Received Spravato 84 mg today as scheduled.  Tolerated it well without nausea or vomiting headache or chest pain or palpitations.  She stopped Auvelity as discussed last week. On her own, without physician input, she restarted Wellbutrin XL 450 mg every morning today.  She had taken it in the past.  She feels jittery and anxious. She feels less depressed than she did last week.  But she is still depressed without her usual range of affect.  She still is less social and less motivated than normal. Her primary care doctor increased the dose of losartan Plan: Stop Seroquel Reduce Wellbutrin XL to 300 mg every morning.  Starting the dose at 450 every morning is likely causing side effects of jitteriness and it should not be started at that have a dosage. Recommend she not change meds on her own without MDM put  05/23/2022 appointment with the following noted: Received Spravato 84 mg today as scheduled.  Tolerated it well without nausea or vomiting headache or chest pain or palpitations.  Has not dropped seroquel XR 300 mg 1/2 tablet nightly bc couldn't sleep without it. Has not tried lower dose quetiapine 50 mg HS Still feels depressed.   BP is better managed so far, just saw PCP.  BP is better today and  infact is low today. Dropped clonidine as directed from 0.3 mg BID bc inadequate control of BP to 0.2 mg BID.  However she wants to increase it back to 0.3 mg twice daily because she feels it helped her anxiety better.  Wonders about increasing Wellbutrin for depression.  However she has only been on 300 mg a day for a week.  She was on 450 mg daily in the past.  06/06/22 appt noted: Received Spravato 84 mg today as scheduled.  Tolerated it well without nausea or vomiting headache or chest pain or palpitations.  She is still depressed and anxious.  She wants to try to stop the Seroquel but cannot sleep without some of it.  She is taking lorazepam 1 mg 4 times daily and still having a lot of anxiety.  She wants to increase clonidine back to 0.3 mg twice daily.  She hopes for more improvement She recently went for a second psychiatric opinion as suggested the results of that are pending.  06/11/22 appt noted: Received Spravato 84 mg today as scheduled.  Tolerated it well without nausea or vomiting headache or chest pain or palpitations.  She is still depressed and anxious. Without much change.  Still hopeless, anhedonia, reduced inteterest and motivation.  Tolerating meds. Disc concerns Spravato is not hleping much. Plan: stop Seroquel and start olanzapine 10 mg HS for TRD and anxiety.  06/13/2022 appointment noted: Received Spravato 84 mg today as scheduled.  Tolerated it well without nausea or vomiting headache or chest pain or palpitations.  She is still depressed and anxious. Without much change.  Still  hopeless, anhedonia, reduced inteterest and motivation.  Tolerating meds. Disc concerns Spravato is not helping much as hoped but is improving a bit in the last week. Tolerating meds. Continues Wellbutrin XL 450 AM, tolerating recently started olanzapine  10 mg HS. Sleep is good.   Pending appt with TMS consult.  06/18/22 appt noted: Received Spravato 84 mg today as scheduled.  Tolerated it well  without nausea or vomiting headache or chest pain or palpitations.  Tolerating meds. Continues Wellbutrin XL 450 AM, tolerating recently started olanzapine  10 mg HS. Continues Adderall XR 20 amd and has tried to reduce lorazepam to 1mg  TID Sleep is good.   Pending appt with TMS consult. Depression is a little bit better in the last week with a little improvement in emotional expression and interest.  She is pushing herself to be more active.  Her daughter thought she was a little better than she has been.  However she is still depressed and still not her normal self with anhedonia and reduced emotional expressiveness.  06/20/22 appt noted: Received Spravato 84 mg today as scheduled.  Tolerated it well without nausea or vomiting headache or chest pain or palpitations.  Tolerating meds with a little sleepiness. Continues Wellbutrin XL 450 AM, tolerating recently started olanzapine  10 mg HS. Continues Adderall XR 20 amd and has tried to reduce lorazepam to 1mg  TID Sleep is good.   Mood is improving.  Better funciton.  Anxiety is better with olanzapine. Still not herself and depression not gone with some anhedonia and social avoidance and feeling overwhelmed.  8/14 2023 received Spravato 84 mg 06/27/2022 received Spravato Spravato 84 mg 07/02/2022 received Spravato 84 mg 07/04/2022 received Spravato 84 mg  07/09/2022 appointment noted: Received Spravato 84 mg today as scheduled.  Tolerated it well without nausea or vomiting headache or chest pain or palpitations.  Expected dissociation and feels less depressed with resolution of negative emotions immediately after Spravato and then depression, anxiety creep back in. Continues meds Adderall XR 20 mg every morning, Wellbutrin XL 450 every morning, clonidine 0.1 mg twice daily, lorazepam 1 mg every 6 hours as needed, olanzapine increased from 7.5 to 10 mg nightly on  Tolerating meds.  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  She does note some increased appetite and is somewhat concerned about that but has not gained significant amounts of weight. She has had the TMS consultation which was initially denied but she knows it can be appealed.  However because she is improving with Spravato plus the other medications now she wants to continue the current treatment plan.  07/18/22 appt noted: Continues meds Adderall XR 20 mg every morning, Wellbutrin XL 450 every morning, clonidine 0.1 mg twice daily, lorazepam 1 mg every 6 hours as needed, olanzapine increased from 7.5 to 10 mg nightly on 07/04/2022. Received Spravato 84 mg today as scheduled.  Tolerated it well without nausea or vomiting headache or chest pain or palpitations.  Expected dissociation and feels less depressed with resolution of negative emotions immediately after Spravato and then depression, anxiety creep back in. Continues meds Adderall XR 20 mg every morning, Wellbutrin XL 450 every morning, clonidine 0.1 mg twice daily, lorazepam 1 mg every 6 hours as needed, olanzapine increased from 7.5 to 10 mg nightly on  Tolerating meds.  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  She does note some increased appetite and is somewhat concerned about that but has not gained significant amounts of weight. She has had the TMS consultation which was initially denied but she knows it can be appealed. She continues to have chronic ambivalence about psychiatric medicines and initially tends to blame her depressive symptoms such as decreased concentration and feeling flat on what ever medicine she currently is taking even though she had the same symptoms before the current medicines were started.  Then after discussion she does admit that her depressive  symptoms are improved since adding olanzapine but still has those residual symptoms noted.  07/23/22 received Spravato 84 mg   07/30/2022 appointment noted: Received Spravato 84 mg today as scheduled.  Tolerated it well without nausea or vomiting headache or chest pain or palpitations.  Expected dissociation and feels less depressed with resolution of negative emotions immediately after Spravato and then depression, anxiety creep back in. Continues meds Adderall XR 20 mg every morning, Wellbutrin XL 450 every morning, clonidine 0.1 mg twice daily, lorazepam 1 mg every 6 hours as needed, olanzapine increased from 7.5 to 10 mg nightly on  She has been inconsistent with olanzapine because she continues to be ambivalent about the medications in general and thinks that perhaps the 10 mg is making her feel blunted.  She continues to feel some depression.  She had a good day this week and but still feels somewhat depressed and persistently anxious. Plan: be consistent with olanzapine 10 mg HS for TRD and longer trial for potential benefit for anxiety.  Has not taken it consistently.  08/06/2022 appointment noted: Received Spravato 84 mg today as scheduled.  Tolerated it well without nausea or vomiting headache or chest pain or palpitations.  Expected dissociation and feels less depressed with resolution of negative emotions immediately after Spravato and then depression, anxiety creep back in. Continues meds Adderall XR 20 mg every morning, Wellbutrin XL 450 every morning, clonidine 0.1 mg twice daily, lorazepam 1 mg every 6 hours as needed, olanzapine i 10 mg nightly  She continues to feel depressed but is about 50% better with Spravato.  She is still not herself.  She still has anhedonia.  She still is not her able to engage socially in the typical ways.  She is not jovial and outgoing like normal.  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  She continues to feel that her personality is dampened down.  There is a question about whether it is related to depression or medication. Plan: continue olanzapine 10 for longer trial for TRD and severe anxiety.  08/13/22 appt noted:  Received Spravato 84 mg today as scheduled.  Tolerated it well without nausea or vomiting headache or chest pain or palpitations.  Expected dissociation and feels less depressed with resolution of negative emotions immediately after Spravato and then depression, anxiety creep back in. Continues meds Adderall XR 20 mg every morning, Wellbutrin XL 450 every morning, clonidine 0.1 mg twice daily, lorazepam 1 mg every 6 hours as needed, olanzapine i 10 mg nightly  She still does not feel herself.  Still struggles with depression and low motivation and reduced social engagement and reduced interest and reduced emotional expression.  She is somewhat better with the medicines plus Spravato.  She still believes the [employer]  makes her blunted and is not sure how much it helps her anxiety.  She can have good days when her family is around and she is engaged.  She still wants to stop the olanzapine. She has apparently continued to take the trazodone despite having been told to stop it when she started olanzapine.  She feels like she needs the trazodone. Plan: DC olanzapine and Start nortriptyline 25 mg nightly and build up to 75 mg nightly and then check blood level.    08/27/2022 appointment noted: Received Spravato 84 mg today as scheduled.  Tolerated it well without nausea or vomiting headache or chest pain or palpitations.  Expected dissociation and feels less depressed with resolution of negative emotions immediately after Spravato and then depression, anxiety creep back in. Continues meds Adderall XR 20 mg every morning, Wellbutrin XL 450 every morning, clonidine 0.1 mg twice daily, lorazepam 1 mg every 6 hours as needed. Stopped  olanzapine and started nortriptyline which she has taken for about a week is 75 mg nightly. So far she is tolerating the nortriptyline well with the exception of some dry mouth and constipation which she is working to manage.  She does not feel substantially better better or different off the olanzapine.  No change in her sleep which is good.  Main concern currently in addition to the residual depression is anxiety which is somewhat situational with pending arch show.  She is worrying about it more than normal.  Says she is having to take lorazepam twice a day where she had been able to keep reduce it prior to this.  She still does not feel like herself with residual depression with less social interest and less of her usual buoyancy in personality.  She is flatter than normal.  Overall she still feels that the Spravato has been helpful at reducing the severity of the depression.  She is not suicidal. She has not heard anything about the TMS appeal as of yet.  09/05/2022 appointment noted: Received Spravato 84 mg today as scheduled.  Tolerated it well without nausea or vomiting headache or chest pain or palpitations.  Expected dissociation and feels less depressed with resolution of negative emotions immediately after Spravato and then depression, anxiety creep back in. Continues meds Adderall XR 20 mg every morning, Wellbutrin XL 450 every morning, clonidine 0.1 mg twice daily, lorazepam 1 mg every 6 hours as needed. Stopped olanzapine and started nortriptyline which she has taken for about 2 week is 75 mg nightly. Initially blood pressure was a little high causing delay in starting Spravato.  She admitted to feeling a little wound up.  She still experiences a little increase in depression if she goes longer than a week in between doses of Spravato.  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  09/10/22 appt noted: Received Spravato 84 mg today as scheduled.  Tolerated it well without nausea or vomiting headache or chest pain or palpitations.  Expected dissociation gradually resolved over the 2 hour observation period. She feels 50% less depressed with Spravto and wants to continue it.   Continues meds Adderall XR 20 mg every morning, Wellbutrin XL 450 every morning, clonidine 0.1 mg twice daily, lorazepam 1 mg every 6 hours as needed. Has started nortriptyline 75 mg nightly for about 3 weeks. Has not seen a significant difference with the addition of nortriptyline.  Tolerating it  pretty well. She continues to have some degree of anhedonia and significant depression and anxiety.  Her daughters noticed that she is more needy and calls more frequently.  She acknowledges this as well.  She is clearly still not herself. Plan: pramipexole off label and RX 0.25 mg BID  09/17/2022 appointment noted: Received Spravato 84 mg today as scheduled.  Tolerated it well without nausea or vomiting headache or chest pain or palpitations.  Expected dissociation gradually resolved over the 2 hour observation period. She feels 50% less depressed with Spravto and wants to continue it.   Continues meds Adderall XR 20 mg every morning, Wellbutrin XL 450 every morning, clonidine 0.1 mg twice daily, lorazepam 1 mg every 6 hours as needed. Has started nortriptyline 75 mg nightly for about 3 weeks and DT level 176, reduced to 50 mg HS early November. Still the same sx as noted last visit.  Tolerating meds.   Compliant.  Still depressed and family notices.  Has been able to participate in family interactions.  Some post-show let down and has to do detailed work which is hard for her bc ADD.  Sleep and eating well.  Energy OK but not great.  No SI.  Not cried in a year or so.  Clearly less depressed and hopeless than before the Spravato.  09/24/22 appt noted: Received Spravato 84 mg today as scheduled.   Tolerated it well without nausea or vomiting headache or chest pain or palpitations.  Expected dissociation gradually resolved over the 2 hour observation period. She feels 50% less depressed with Spravto and wants to continue it.   Continues meds Adderall XR 20 mg every morning, Wellbutrin XL 450 every morning, clonidine 0.1 mg twice daily, lorazepam 1 mg every 6 hours as needed. Has started nortriptyline 75 mg nightly for about 3 weeks and DT level 176, reduced to 50 mg HS early November. Still the same sx as noted last visit.  Tolerating meds.   Compliant.  Still depressed and family notices.  Has been able to participate in family interactions.  Some post-show let down and has to do detailed work which is hard for her bc ADD.  Sleep and eating well.  Energy OK but not great.  No SI.  Not cried in a year or so.  Clearly less depressed and hopeless than before the Spravato. Is not making further progress generally.  Stuck with moderate depression  10/02/22 appt noted: Received Spravato 84 mg today as scheduled.  Tolerated it well without nausea or vomiting headache or chest pain or palpitations.  Expected dissociation gradually resolved over the 2 hour observation period. She feels 50% less depressed with Spravto and wants to continue it.   Continues meds Adderall XR 20 mg every morning, Wellbutrin XL 450 every morning, clonidine 0.1 mg twice daily, lorazepam 1 mg every 6 hours as needed. Has started nortriptyline 75 mg nightly for about 3 weeks and DT level 176, reduced to 50 mg HS early November. Still the same sx as noted last visit.  Tolerating meds.   Compliant.  Still depressed and family notices.  Has been able to participate in family interactions.  Some post-show let down and has to do detailed work which is hard for her bc ADD.  Sleep and eating well.  Energy OK but not great.  No SI.  Not cried in a year or so.  Clearly less depressed and hopeless than before the Spravato. Is not making  further progress generally.  Stuck with  moderate depression.  Is able to function pretty normally. Plan: trial pramipexole 0.25 mg BID off label for depression.   10/08/22 appt noted: Received Spravato 84 mg today as scheduled.  Tolerated it well without nausea or vomiting headache or chest pain or palpitations.  Expected dissociation gradually resolved over the 2 hour observation period. She feels 50% less depressed with Spravto and wants to continue it.   Continues meds Adderall XR 20 mg every morning, Wellbutrin XL 450 every morning, clonidine 0.1 mg twice daily, lorazepam 1 mg every 6 hours as needed. Has started nortriptyline 75 mg nightly for about 3 weeks and DT level 176, reduced to 50 mg HS early November. Still the same sx as noted last visit.  Tolerating meds.   Compliant.  Still depressed and family notices.  Has been able to participate in family interactions.  Some post-show let down and has to do detailed work which is hard for her bc ADD.  Sleep and eating well.  Energy OK but not great.  No SI.  Not cried in a year or so.  Clearly less depressed and hopeless than before the Spravato. Is not making further progress generally.  Stuck with moderate depression.  Behaved and felt pretty normally with family over for Thanksgiving. Doesn't see benefit or SE with pramipexole but thinks maybe it makes her worse. Plan:  increase pramipexole augmentation off label to 0.5 mg BID  10/15/2022 appointment noted: Received Spravato 84 mg today as scheduled.  Tolerated it well without nausea or vomiting headache or chest pain or palpitations.  Expected dissociation gradually resolved over the 2 hour observation period. She feels 50% less depressed with Spravto and wants to continue it.   Continues meds Adderall XR 20 mg every morning, Wellbutrin XL 450 every morning, clonidine 0.2 mg twice daily, lorazepam 1 mg every 6 hours as needed.  Trazodone 50 mg tablets 1-2 nightly as needed insomnia Has  started nortriptyline 75 mg nightly for about 3 weeks and DT level 176, reduced to 50 mg HS early November. Recommended increase pramipexole 0.5 mg twice daily off label for treatment resistant depression on 10/08/2022. She feels better motivated more active with pramipexole 0.5 mg twice daily.  She still is depressed but it is better.  We discussed the possibility of going up in the dose but did not change it.  10/22/2022 appointment noted: Received Spravato 84 mg today as scheduled.  Tolerated it well without nausea or vomiting headache or chest pain or palpitations.  Expected dissociation gradually resolved over the 2 hour observation period. She feels 50% less depressed with Spravto and wants to continue it.   Continues meds Adderall XR 20 mg every morning, Wellbutrin XL 450 every morning, clonidine 0.2 mg twice daily, lorazepam 1 mg every 8 hours as needed.  Trazodone 50 mg tablets 1-2 nightly as needed insomnia Has started nortriptyline 75 mg nightly for about 3 weeks and DT level 176, reduced to 50 mg HS early November. pramipexole 0.5 mg twice daily off label for treatment resistant depression on 10/08/2022. She is better motivated than she was.  She is journaling 3 pages a day.  She has started walking and has walked 5 days a week for 50 minutes for the last 2 weeks.  That is significantly helped her mood.  Her mood tends to be better when she interacts with family.  However she still has some periods of depression.  She is tolerating the medications well.  She still feels like her affect  and confidence is not back to normal. Plan:  increase pramipexole augmentation off label to 0.5 mg tID or 0.75 mg BID   10/29/22 appt noted: Received Spravato 84 mg today as scheduled.  Tolerated it well without nausea or vomiting headache or chest pain or palpitations.  Expected dissociation gradually resolved over the 2 hour observation period. She feels 50% less depressed with Spravto and wants to  continue it.   Continues meds Adderall XR 20 mg every morning, Wellbutrin XL 450 every morning, clonidine 0.2 mg twice daily, lorazepam 1 mg every 8 hours as needed.  Trazodone 50 mg tablets 1-2 nightly as needed insomnia Has started nortriptyline 75 mg nightly for about 3 weeks and DT level 176, reduced to 50 mg HS early November. pramipexole increased to 0.75 mg twice daily off label for treatment resistant depression  She has been feeling somewhat better with the increase in pramipexole and is tolerating it well.  She still is easily overwhelmed.  Her affect and mood can improve now when around her family or doing something positive.  She has been able to be more productive. She is tolerating the current medications. She is still considering TMS as an alternative to Spravato.  11/15/2022 appointment noted: Received Spravato 84 mg today as scheduled.  Tolerated it well without nausea or vomiting headache or chest pain or palpitations.  Expected dissociation gradually resolved over the 2 hour observation period. She feels 50% less depressed with Spravto and wants to continue it.   Continues meds Adderall XR 20 mg every morning, Wellbutrin XL 450 every morning, clonidine 0.2 mg twice daily, lorazepam 1 mg every 8 hours as needed.  Trazodone 50 mg tablets 1-2 nightly as needed insomnia Has started nortriptyline 75 mg nightly for about 3 weeks and DT level 176, reduced to 50 mg HS early November. pramipexole increased to 0.75 mg twice daily off label for treatment resistant depression  He has noticed some increase in depression due to the length of time since the last Spravato administration.  She believes Spravato is helping her.  sHe is not suicidal but has felt very blue the last few days. She is tolerating the medication. She is ambivalent about Spravato versus TMS but she is considering TMS. She wants to continue Spravato administration because it is clearly helpful.  11/19/22 appt noted: Received  Spravato 84 mg today as scheduled.  Tolerated it well without nausea or vomiting headache or chest pain or palpitations.  Expected dissociation gradually resolved over the 2 hour observation period. She feels 50% less depressed with Spravto and wants to continue it.   Continues meds Adderall XR 20 mg every morning, Wellbutrin XL 450 every morning, clonidine 0.2 mg twice daily, lorazepam 1 mg every 8 hours as needed.  Trazodone 50 mg tablets 1-2 nightly as needed insomnia Has started nortriptyline 75 mg nightly for about 3 weeks and DT level 176, reduced to 50 mg HS early November. pramipexole increased to 0.75 mg twice daily off label for treatment resistant depression  She has felt better back on Spravato more regularly.  However she still has residual depression esp when alone or when wihtout activity.  Can function when needed.   Does not have her connfidence back. She plans to pursue TMS availability. Have discussed retrying Auvelity  11/28/22 appt noted: Received Spravato 84 mg today as scheduled.  Tolerated it well without nausea or vomiting headache or chest pain or palpitations.  Expected dissociation gradually resolved over the 2 hour observation period.  She feels 50% less depressed with Spravto and wants to continue it.   Continues meds Adderall XR 20 mg every morning, Wellbutrin XL 450 every morning, clonidine 0.2 mg twice daily, lorazepam 1 mg every 8 hours as needed.  Trazodone 50 mg tablets 1-2 nightly as needed insomnia Has started nortriptyline 75 mg nightly for about 3 weeks and DT level 176, reduced to 50 mg HS early November. pramipexole increased to 0.75 mg twice daily off label for treatment resistant depression  Last week she was more depressed than usual for reasons that are not clear.  She has not had a clear answer from Filomena Jungling about [employer] options.  States that they have not returned her call.  She is asked about Auvelity retry in place of Wellbutrin.  Has still been  walking.  12/03/22 appt noted: Received Spravato 84 mg today as scheduled.  Tolerated it well without nausea or vomiting headache or chest pain or palpitations.  Expected dissociation gradually resolved over the 2 hour observation period. She feels 50% less depressed with Spravto and wants to continue it.   Continues meds Adderall XR 20 mg every morning, Wellbutrin XL 450 every morning, clonidine 0.2 mg twice daily, lorazepam 1 mg every 8 hours as needed.  Trazodone 50 mg tablets 1-2 nightly as needed insomnia started nortriptyline 75 mg nightly for about 3 weeks and DT level 176, reduced to 50 mg HS early November. pramipexole increased to 0.75 mg twice daily off label for treatment resistant depression  No SE .  Satisfied with meds. Depression is better in the last week.  Not sure why that is the case.  Still walking daily and that helps and journaling 3 pages daily.  Working on Biomedical scientist.  No changes desire.  Clearly benefits from Spravato but not 100%.  Still considering TMS.  12/10/22 appt noted: Received Spravato 84 mg today as scheduled.  Tolerated it well without nausea or vomiting headache or chest pain or palpitations.  Expected dissociation gradually resolved over the 2 hour observation period. She feels 50% less depressed with Spravto and wants to continue it.   Continues meds Adderall XR 20 mg every morning, Wellbutrin XL 450 every morning, clonidine 0.2 mg twice daily, lorazepam 1 mg every 8 hours as needed.  Trazodone 50 mg tablets 1-2 nightly as needed insomnia started nortriptyline 75 mg nightly for about 3 weeks and DT level 176, reduced to 50 mg HS early November. pramipexole increased to 0.75 mg twice daily off label for treatment resistant depression  No SE .   Depression was worse this week for no apparent reason.  She struggled being positive.  She has felt more discouraged.  She has felt more anxious.  She has had a hard time doing tasks.  She is interested in retrying the  [place] which we had discussed previously. Plan: She agrees to [employer].  To improve tolerability and reduce risk of side effects, Stop Wellbutrin and start Auvelity 1 in the morning for 1 week then 1 twice daily AndReduce pramipexole 0.5 mg BID and reduce nortriptyline to 50 mg HS  12/17/22 appt noted: Received Spravato 84 mg today as scheduled.  Tolerated it well without nausea or vomiting headache or chest pain or palpitations.  Expected dissociation gradually resolved over the 2 hour observation period. She feels 50% less depressed with Spravto and wants to continue it.   Continues meds Adderall XR 20 mg every morning, stopped Wellbutrin XL 150 every morning, clonidine 0.2 mg twice daily,  lorazepam 1 mg every 8 hours as needed.  Trazodone 50 mg tablets 1-2 nightly as needed insomnia Has started nortriptyline 75 mg nightly for about 3 weeks and DT level 176, reduced to 50 mg HS early November. pramipexole 0.375 mg twice daily off label for treatment resistant depression with plan to stop Started Auvelity 1 AM Still depressed to moderate degree.  Tolerating Auvelity so far.  No other problems with meds.  Willing to give Auvelity a chance. Plan: She agrees to [employer].  Continue 1 AM and when tolerated then increase to BID Stop pramipexole.  12/26/22 received Spravato  01/01/23 appt noted: Received Spravato 84 mg today as scheduled.  Tolerated it well without nausea or vomiting headache or chest pain or palpitations.  Expected dissociation gradually resolved over the 2 hour observation period. She feels 50% less depressed with Spravto and wants to continue it.   Continues meds Adderall XR 20 mg every morning, stopped Wellbutrin XL 150 every morning, clonidine 0.2 mg twice daily, lorazepam 1 mg every 8 hours as needed.  Trazodone 50 mg tablets 1-2 nightly as needed insomnia Has started nortriptyline 75 mg nightly for about 3 weeks and DT level 176, reduced to 50 mg HS early  November. Forgot to reduce pramipexole stoll taking  0.5 mg twice daily off label for treatment resistant depression  Started Auvelity 1 AM & PM last week. Occ misses Spravato DT htn.this week a better than last.   Painting again more and it helps mood.  Confidence still low and not as likely to socialize as normal but enjoys family.   Still ambivalent about TMS. Tolerating meds.  Including the increase in [place].    01/08/23 appt noted: Received Spravato 84 mg today as scheduled.  Tolerated it well without nausea or vomiting headache or chest pain or palpitations.  Expected dissociation gradually resolved over the 2 hour observation period. She feels 50% less depressed with Spravto and wants to continue it.   Continues meds Adderall XR 20 mg every morning, stopped Wellbutrin XL 150 every morning, clonidine 0.2 mg twice daily, lorazepam 1 mg every 8 hours as needed.  Trazodone 50 mg tablets 1-2 nightly as needed insomnia Has started nortriptyline 75 mg nightly for about 3 weeks and DT level 176, reduced to 50 mg HS early November. reduced pramipexole to 0.5 mg daily off label for treatment resistant depression  Started Auvelity 1 AM & PM mid Feb. More depressed as week progresses.  Thinks it is worse with less pramipexole.  More negative.  Able to function but feels miserable.  No SI.  Hopeless.  Sleep ok.  Tolerating meds.   Talked to [facility] about TMS and appt mid March. Plan: increase pramipexole to 0.5 mg TID for dep bc seemed to worsen with the reduction.  01/15/23 appt noted: Received Spravato 84 mg today as scheduled.  Tolerated it well without nausea or vomiting headache or chest pain or palpitations.  Expected dissociation gradually resolved over the 2 hour observation period. She feels 50% less depressed with Spravto and wants to continue it.   Continues meds Adderall XR 20 mg every morning, stopped Wellbutrin XL 150 every morning, clonidine 0.2 mg twice daily, lorazepam 1 mg every 8  hours as needed.  Trazodone 50 mg tablets 1-2 nightly as needed insomnia Has started nortriptyline 75 mg nightly for about 3 weeks and DT level 176, reduced to 50 mg HS early November. Increased pramipexole to 0.5 mg  to TID  off label for  treatment resistant depression  Started Auvelity 1 AM & PM mid Feb. markedly better depression the increase in pramipexole.  She feels like her depression is almost resolved.  She is very pleased with the response.  She is tolerating the medications well  01/30/23 appt noted: Received Spravato 84 mg today as scheduled.  Tolerated it well without nausea or vomiting headache or chest pain or palpitations.  Expected dissociation gradually resolved over the 2 hour observation period. She feels 50% less depressed with Spravto and wants to continue it.   Continues meds Adderall XR 20 mg every morning, stopped Wellbutrin XL 150 every morning, clonidine 0.2 mg twice daily, lorazepam 1 mg every 8 hours as needed.  Trazodone 50 mg tablets 1-2 nightly as needed insomnia Has started nortriptyline 75 mg nightly for about 3 weeks and DT level 176, reduced to 50 mg HS early November. Increased pramipexole to 0.5 mg  to TID  off label for treatment resistant depression  Started Auvelity 1 AM & PM mid Feb. Mood markedly better with pramipexole added.  Nearly normal mood now with minimal depression.  More social and outgoing and motivated and resolved anhedonia. No SE.  Compliant.  02/04/23 appt noted: Continues meds Adderall XR 20 mg every morning, stopped Wellbutrin XL 150 every morning, clonidine 0.2 mg twice daily, lorazepam 1 mg every 8 hours as needed.  Trazodone 50 mg tablets 1-2 nightly as needed insomnia Has started nortriptyline 75 mg nightly for about 3 weeks and DT level 176, reduced to 50 mg HS early November. Increased pramipexole to 0.5 mg  to TID  off label for treatment resistant depression  Started Auvelity 1 AM & PM mid Feb. Received Spravato 84 mg today as  scheduled.  Tolerated it well without nausea or vomiting headache or chest pain or palpitations.  Expected dissociation gradually resolved over the 2 hour observation period. She feels 50% less depressed with Spravto and wants to continue it.  The pramipexole got her the rest of the way better.  Doesn't want to cut back on any tx bc afraid of relapse. Tolerating meds.  02/15/23 appt noted: Continues meds Adderall XR 20 mg every morning, stopped Wellbutrin XL 150 every morning, clonidine 0.2 mg twice daily, lorazepam 1 mg every 8 hours as needed.  Trazodone 50 mg tablets 1-2 nightly as needed insomnia Has started nortriptyline 75 mg nightly for about 3 weeks and DT level 176, reduced to 50 mg HS early November. Increased pramipexole to 0.5 mg  to TID  off label for treatment resistant depression  Started Auvelity 1 AM & PM mid Feb. Received Spravato 84 mg today as scheduled.  Tolerated it well without nausea or vomiting headache or chest pain or palpitations.  Expected dissociation gradually resolved over the 2 hour observation period. She feels no longer depressed.   The pramipexole got her the rest of the way better.  Doesn't want to cut back on any tx bc afraid of relapse. Tolerating meds.  02/18/23 appt noted: Continues meds Adderall XR 20 mg every morning, stopped Wellbutrin XL 150 every morning, clonidine 0.2 mg twice daily, lorazepam 1 mg every 8 hours as needed.  Trazodone 50 mg tablets 1-2 nightly as needed insomnia Has started nortriptyline 75 mg nightly for about 3 weeks and DT level 176, reduced to 50 mg HS early November. Increased pramipexole to 0.5 mg  to TID  off label for treatment resistant depression  Started Auvelity 1 AM & PM mid Feb. Received Spravato 84 mg  today as scheduled.  Tolerated it well without nausea or vomiting headache or chest pain or palpitations.  Expected dissociation gradually resolved over the 2 hour observation period. She feels no longer depressed except when  ran out of Auvelity this week DT lack of availability.  Much worse without it..   The pramipexole got her the rest of the way better.  Doesn't want to cut back on any tx bc afraid of relapse. Tolerating meds.  02/25/23 appt: Received Spravato 84 mg today as scheduled.  Tolerated it well without nausea or vomiting headache or chest pain or palpitations.  Expected dissociation gradually resolved over the 2 hour observation period. Continues meds Adderall XR 20 mg every morning, stopped Wellbutrin XL 150 every morning, clonidine 0.2 mg twice daily, lorazepam 1 mg every 8 hours as needed.  Trazodone 50 mg tablets 1-2 nightly as needed insomnia Has started nortriptyline 75 mg nightly for about 3 weeks and DT level 176, reduced to 50 mg HS early November. Increased pramipexole to 0.5 mg  to TID  off label for treatment resistant depression  Started Auvelity 1 AM & PM mid Feb. No SE Still dramatically better with meds and Spravato.  Not 100% over depression. Had GS born last week and able to enjoy it now.  Is a little sleepy with pramipexole but manageable.  No change desired.  Sleep is ok.  03/04/23 appt noted: Received Spravato 84 mg today as scheduled.  Tolerated it well without nausea or vomiting headache or chest pain or palpitations.  Expected dissociation gradually resolved over the 2 hour observation period. Continues meds Adderall XR 20 mg every morning, stopped Wellbutrin XL 150 every morning, clonidine 0.2 mg twice daily, lorazepam 1 mg every 8 hours as needed.  Trazodone 50 mg tablets 1-2 nightly as needed insomnia nortriptyline reduced to 50 mg HS early November. Increased pramipexole to 0.5 mg  to TID  off label for treatment resistant depression  Started Auvelity 1 AM & PM mid Feb. Still generally doing well with meds and Spravato except when got tooth abscess.  Freels more depressed after dental surgery.   No SE with meds.   Sleep is OK.  No med changes desired  03/11/23 appt  noted: Received Spravato 84 mg today as scheduled.  Tolerated it well without nausea or vomiting headache or chest pain or palpitations.  Expected dissociation gradually resolved over the 2 hour observation period. Continues meds Adderall XR 20 mg every morning, stopped Wellbutrin XL 150 every morning, clonidine 0.2 mg twice daily, lorazepam 1 mg every 8 hours as needed.  Trazodone 50 mg tablets 1-2 nightly as needed insomnia nortriptyline reduced to 50 mg HS early November. Increased pramipexole to 0.5 mg  to TID  off label for treatment resistant depression  Started Auvelity 1 AM & PM mid Feb. Some increase depression with infection and needing amoxiciliin this week.  Otherwie still doing well with mood and meds. No med changes desired or indicated. No SE  03/21/2023 appointment noted: Continues meds Adderall XR 20 mg every morning, stopped Wellbutrin XL 150 every morning, clonidine 0.2 mg twice daily, lorazepam 1 mg every 8 hours as needed.  Trazodone 50 mg tablets 1-2 nightly as needed insomnia nortriptyline reduced to 50 mg HS early November. Increased pramipexole to 0.5 mg  to TID  off label for treatment resistant depression  Started Auvelity 1 AM & PM mid Feb. Received Spravato 84 mg today as scheduled.  Tolerated it well without nausea or vomiting headache  or chest pain or palpitations.  Expected dissociation gradually resolved over the 2 hour observation period. Depression is much better.  Pretty much resolved this week.  She feels the Auvelity and pramipexole have made the biggest difference.  Obviously the Spravato is also helping significantly.  She wants to continue all of these medications.  She still has anxiety easily.  Especially facing something that she would rather avoid.  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 Tolerating meds without significant side effects. Plan no med changes  03/29/23 appt  noted: Continues meds Adderall XR 20 mg every morning, stopped Wellbutrin XL 150 every morning, clonidine 0.2 mg twice daily, lorazepam 1 mg every 8 hours as needed.  Trazodone 50 mg tablets 1-2 nightly as needed insomnia, nortriptyline 50 mg HS,  pramipexole 0.5 mg  to TID  off label for treatment resistant depression  Started Auvelity 1 AM & PM mid Feb. No SE Received Spravato 84 mg today as scheduled.  Tolerated it well without nausea or vomiting headache or chest pain or palpitations.  Expected dissociation gradually resolved over the 2 hour observation period. Depression continues to improve to a point of very mild sx.  Still hasn't gotten her confidence fully back.  She does enjoy social interactions and is productive at work. She is tolerating the meds well without side effects.  We discussed the possibility of reducing some of the medication given the marked response which was positive with pramipexole.  ECT-MADRS    Flowsheet Row Clinical Support from 01/08/2023 in [facility] Clinical Support from 08/06/2022 in [facility] Clinical Support from 07/04/2022 in [facility] Clinical Support from 05/21/2022 in [facility] Office Visit from 03/02/2022 in [facility]  MADRS Total Score 21 29 15 27  46        Past Psychiatric Medication Trials: fluoxetine, duloxetine, Viibryd, Pristiq, sertraline, citalopram,  Trintellix anxious and SI Wellbutrin XL 450 Auvelity 1 dose nortriptyline 75 mg nightly for about 3 weeks and DT level 176, reduced to 50 mg HS NR. Pramipexole 1 mg NR Adderall, Adderall XR, Vyvanse, Ritalin, Strattera low dose NR Lorazepam Trazodone  Depakote,  lamotrigine cog complaints Lithium remotely Abilify 7.5  Vraylar 1.5 mg daily agitation and insomnia Rexulti insomnia Latuda 40 one dose, CO anxious and SI Seroquel XR  300 Olanzapine 10  At visit November 12, 2019. We discussed Patient developed an increasingly severe alcohol dependence problem since her last visit in January.  She went to [facility] and has had no alcohol since then except 1 day.  She never abused stimulants but they took her off the stimulants at [facility].  Her ADD was markedly worse.  The Wellbutrin did not help the ADD.   D history lamotrigine rash at 67 yo  Review of Systems:  Review of Systems  Constitutional:  Negative for fatigue.  Musculoskeletal:  Positive for back pain. Negative for arthralgias and joint swelling.       SP hip surgery October 2020  Neurological:  Negative for dizziness.  Psychiatric/Behavioral:  Positive for decreased concentration and dysphoric mood. Negative for agitation, behavioral problems, confusion, hallucinations, self-injury, sleep disturbance and suicidal ideas. The patient is nervous/anxious. The patient is not hyperactive.        Forgetful at times about med recommendations.  Send present for him The only  [place] she notices a it states it is unbelievable him how how much are her 3 steps around freedom of the present fall and just in the past decade  Medications: I have reviewed the patient's current medications.  Current Outpatient Medications  Medication Sig Dispense Refill   amLODipine (NORVASC) 2.5 MG tablet Take 2.5 mg by mouth daily.     amphetamine-dextroamphetamine (ADDERALL XR) 20 MG 24 hr capsule Take 1 capsule (20 mg total) by mouth every morning. 30 capsule 0   amphetamine-dextroamphetamine (ADDERALL XR) 20 MG 24 hr capsule Take 1 capsule (20 mg total) by mouth every morning. 30 capsule 0   buPROPion (WELLBUTRIN XL) 150 MG 24 hr tablet TAKE 3 TABLETS BY MOUTH DAILY (Patient taking differently: Take 150 mg by mouth daily.) 270 tablet 1   cloNIDine (CATAPRES) 0.2 MG tablet TAKE 1 TABLET BY MOUTH 2 TIMES DAILY. 180 tablet 0   Dextromethorphan-buPROPion ER (AUVELITY) 45-105 MG  TBCR Take 1 tablet by mouth 2 (two) times daily. 60 tablet 3   Esketamine HCl, 84 MG Dose, (SPRAVATO, 84 MG DOSE,) 28 MG/DEVICE SOPK USE 3 SPRAYS IN EACH NOSTRIL ONCE A WEEK 3 each 1   iron polysaccharides (NIFEREX) 150 MG capsule TAKE 1 CAPSULE BY MOUTH EVERY DAY 90 capsule 1   LORazepam (ATIVAN) 1 MG tablet Take 1 tablet (1 mg total) by mouth every 8 (eight) hours as needed. for anxiety 90 tablet 1   losartan (COZAAR) 50 MG tablet Take 50 mg by mouth daily.     nebivolol (BYSTOLIC) 2.5 MG tablet Take 2.5 mg by mouth daily.     nortriptyline (PAMELOR) 25 MG capsule Take 2 capsules (50 mg total) by mouth at bedtime. 180 capsule 0   pramipexole (MIRAPEX) 0.5 MG tablet Take 1 tablet (0.5 mg total) by mouth 3 (three) times daily. 90 tablet 1   traZODone (DESYREL) 50 MG tablet TAKE 1-2 TABLETS BY MOUTH NIGHTLY AS NEEDED FOR SLEEP 180 tablet 1   No current facility-administered medications for this visit.    Medication Side Effects: None  Allergies:  Allergies  Allergen Reactions   Metronidazole Shortness Of Breath and Other (See Comments)    Heart pounding   Ferrlecit [Na Ferric Gluc Cplx In Sucrose] Other (See Comments)    Infusion reaction 05/12/2019    Past Medical History:  Diagnosis Date   ADHD    Anemia    Anxiety    Arthritis    Depression    Heart murmur    i went to see a cardiologit slast eyar  and i had zero plaque,    PONV (postoperative nausea and vomiting)    Recovering alcoholic in remission (HCC)     Family History  Problem Relation Age of Onset   Atrial fibrillation Mother    CAD Father     Past Medical History, Surgical history, Social history, and Family history were reviewed and updated as appropriate.   Please see review of systems for further details on the patient's review from today.   Objective:   Physical Exam:  There were no vitals taken for this visit.  Physical Exam Constitutional:      General: She is not in acute distress. Neurological:      Mental Status: She is alert and oriented to person, place, and time.     Coordination: Coordination normal.     Gait: Gait normal.  Psychiatric:        Attention and Perception: Attention and perception normal.  Mood and Affect: Mood is anxious. Mood is not depressed. Affect is not tearful.        Speech: Speech is not rapid and pressured or slurred.        Behavior: Behavior is not slowed.        Thought Content: Thought content is not paranoid or delusional. Thought content does not include homicidal or suicidal ideation. Thought content does not include suicidal plan.        Cognition and Memory: Cognition normal. Memory is not impaired.        Judgment: Judgment normal.     Comments: Insight intact. No auditory or visual hallucinations. No delusions.  Depression nearly resolved with pramipexole TID and relapsed when out of Auvelity. No manic sx     Lab Review:     Component Value Date/Time   NA 137 01/12/2021 1430   NA 140 11/18/2018 1544   K 3.8 01/12/2021 1430   CL 108 01/12/2021 1430   CO2 22 01/12/2021 1430   GLUCOSE 94 01/12/2021 1430   BUN 14 01/12/2021 1430   BUN 20 11/18/2018 1544   CREATININE 0.82 01/12/2021 1430   CALCIUM 8.9 01/12/2021 1430   PROT 6.6 01/12/2021 1430   ALBUMIN 3.9 01/12/2021 1430   AST 12 (L) 01/12/2021 1430   ALT 11 01/12/2021 1430   ALKPHOS 46 01/12/2021 1430   BILITOT 0.5 01/12/2021 1430   GFRNONAA >60 01/12/2021 1430   GFRAA >60 09/02/2019 0249   GFRAA >60 01/27/2019 0811       Component Value Date/Time   WBC 4.5 01/12/2021 1430   RBC 4.32 01/12/2021 1430   HGB 12.8 01/12/2021 1430   HGB 12.9 07/17/2019 0953   HCT 38.5 01/12/2021 1430   HCT 21.9 (L) 12/25/2018 1221   PLT 272 01/12/2021 1430   PLT 286 07/17/2019 0953   MCV 89.1 01/12/2021 1430   MCH 29.6 01/12/2021 1430   MCHC 33.2 01/12/2021 1430   RDW 12.4 01/12/2021 1430   LYMPHSABS 1.4 01/12/2021 1430   MONOABS 0.4 01/12/2021 1430   EOSABS 0.0 01/12/2021 1430    BASOSABS 0.0 01/12/2021 1430    No results found for: "POCLITH", "LITHIUM"   No results found for: "PHENYTOIN", "PHENOBARB", "VALPROATE", "CBMZ"   .res Assessment: Plan:    Recurrent major depression resistant to treatment (HCC)  Generalized anxiety disorder  Attention deficit hyperactivity disorder (ADHD), predominantly inattentive type  Insomnia due to mental condition   She has treatment resistant major depression ongoing with 50% better with Spravato. Have  discussed some of her  abnormal behaviors last year leading to this depressive episode getting worse which she says were associated with heavy use of delta 8 and not a manic episode.  She realizes now that that was not good for her.  She stopped all use of other drugs including those available over-the-counter such as delta 8 or any other THC related products.  She is no longer having any of those types of behaviors and instead is depressed.  She is experiencing more pleasure and is less blunted and enjoying more and better inteest versus before the Spravato.  She also feels worse if misses a dose of Spravato.  Depression resolved recently after increasing pramipexole 0.5 mg TID, brief relapse when out of Auvelity Consider switch to ER to reduce sleepiness  Patient was administered Spravato 84 mg intranasally today.  The patient experienced the typical dissociation which gradually resolved over the 2-hour period of observation.  There were no complications.  Specifically the patient did not have nausea or vomiting or headache.  Blood pressures monitored at the 40-minute and 2-hour follow-up intervals.  Borderline high.  By the time the 2-hour observation period was met the patient was alert and oriented and able to exit without assistance.  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  See nursing note for further details.She wants to continue  Spravato. We discussed discussed the side effects in detail as well as the protocol required to receive Spravato.   Failed multiple antidepressants.  Many of them were not actual failures but intolerances and it is unclear whether some of that was more connected with anxiety than true side effects.  1 example is the [place].  In general she does not want to try anything but an antidepressant but has failed all major categories of antidepressants except TCAs and MAO inhibitors which have not been tried until now.  Started nortriptyline 75 mg nightly.  Serum level 176.  So Reduced to 50 mg HS. Consider stopping it.  Discussed side effects in detail.  Needs more time to help. We discussed the possibility of trying to wean off nortriptyline in the hopes that she can maintain an adequate response with the Auvelity plus pramipexole and Spravato.  We will discuss it again and she prefers not to make the change today.  She is tolerating Auvelity.  Continue 1  BID bc more depressed without it.  Worse with less pramipexole. Increased to 0.5 mg TID on about end of Feb 2024 and markedly better Dosing range off label for depression ranges from 1-5 mg daily.  Disc risk compulsive impulsive behavior and mania.    Started Spravato 84 mg twice weekly on 03/16/2022.  Now on weekly administration  Adderall  XR 20 mg AM   Ativan 1 mg 3 times daily as needed anxiety but try to cut it back.  She still feels she needs it. Is not ideal to use benzodiazepine with stimulant but because of the severity of her symptoms it has been necessary.  Hope to eventually eliminate the benzodiazepine.  Expected as her depression improves her anxiety will improve as well.   We will expect that to improve as the depression improves.  She has headed insturctions to reduce this.  Continue clonidine 0.2 mg BID off label for anxiety and helps BP partially. BP is better controlled but not consistent.  Consider increasing amlodipine.  Also on  losartan 50  Discussed potential benefits, risks, and side effects of stimulants with patient to include increased heart rate, palpitations, insomnia, increased anxiety, increased irritability, or decreased appetite.  Instructed patient to contact office if experiencing any significant tolerability issues. She wants to return to usual dose of Adderall for ADD bc of mor poor cognitive function with reduction.  Also discussed that depression will impair cognitive function.  Disc risk polypharmacy.  Reevaluate meds after better with pramipexole.  Relapsed after running out of Auvelity so needs clearly it and pramipexole.  Give it more time.  But consider trying to wean nortriptyline in hopes she can do as well with less medication.  Rec keep track of BP and discuss with PCP. It has been up and down.  Sometimes needs extra clonidine before Spravato  Has Maintained sobriety  No med changes indicated today: Continue Adderall XR 20 mg every morning Continue Wellbutrin XL 150 every morning Continue Auvelity 1 tablet twice daily,  continue clonidine 0.2 mg  twice daily for anxiety and blood pressure. Continue lorazepam 1 mg 3 times daily she has been unable to cut back on the dose due to anxiety. Continue nortriptyline 50 mg nightly Continue pramipexole 0.5 mg 3 times daily Continue trazodone 50 mg tablets 1-2 nightly as needed for sleep. Consider weaning nortriptyline given insufficient benefot with it.  FU with Spravato weekly   Meredith Staggers, MD, DFAPA     Please see After Visit Summary for patient specific instructions.  Future Appointments  Date Time Provider Department Center  04/01/2023  9:45 AM Cottle, Steva Ready., MD [facility] None  04/01/2023  9:45 AM CP-NURSE [facility] None                    No orders of the defined types were placed in this encounter.      -------------------------------

## 2023-04-01 ENCOUNTER — Encounter: Payer: 59 | Admitting: Psychiatry

## 2023-04-01 ENCOUNTER — Other Ambulatory Visit: Payer: Self-pay

## 2023-04-01 DIAGNOSIS — F339 Major depressive disorder, recurrent, unspecified: Secondary | ICD-10-CM

## 2023-04-01 MED ORDER — AUVELITY 45-105 MG PO TBCR
1.0000 | EXTENDED_RELEASE_TABLET | Freq: Two times a day (BID) | ORAL | 3 refills | Status: DC
Start: 2023-04-01 — End: 2023-06-11

## 2023-04-03 ENCOUNTER — Other Ambulatory Visit: Payer: Self-pay | Admitting: Psychiatry

## 2023-04-03 ENCOUNTER — Encounter: Payer: 59 | Admitting: Psychiatry

## 2023-04-04 ENCOUNTER — Ambulatory Visit (INDEPENDENT_AMBULATORY_CARE_PROVIDER_SITE_OTHER): Payer: 59 | Admitting: Psychiatry

## 2023-04-04 ENCOUNTER — Encounter: Payer: Self-pay | Admitting: Psychiatry

## 2023-04-04 ENCOUNTER — Other Ambulatory Visit: Payer: Self-pay

## 2023-04-04 ENCOUNTER — Ambulatory Visit: Payer: 59

## 2023-04-04 VITALS — BP 125/84 | HR 63

## 2023-04-04 DIAGNOSIS — F339 Major depressive disorder, recurrent, unspecified: Secondary | ICD-10-CM

## 2023-04-04 DIAGNOSIS — I10 Essential (primary) hypertension: Secondary | ICD-10-CM

## 2023-04-04 DIAGNOSIS — F900 Attention-deficit hyperactivity disorder, predominantly inattentive type: Secondary | ICD-10-CM

## 2023-04-04 DIAGNOSIS — F411 Generalized anxiety disorder: Secondary | ICD-10-CM | POA: Diagnosis not present

## 2023-04-04 DIAGNOSIS — F5105 Insomnia due to other mental disorder: Secondary | ICD-10-CM | POA: Diagnosis not present

## 2023-04-04 MED ORDER — PRAMIPEXOLE DIHYDROCHLORIDE 0.5 MG PO TABS
0.5000 mg | ORAL_TABLET | Freq: Three times a day (TID) | ORAL | 1 refills | Status: DC
Start: 2023-04-04 — End: 2023-06-11

## 2023-04-04 MED ORDER — PRAMIPEXOLE DIHYDROCHLORIDE 0.5 MG PO TABS
0.5000 mg | ORAL_TABLET | Freq: Three times a day (TID) | ORAL | 1 refills | Status: DC
Start: 2023-04-04 — End: 2023-04-04

## 2023-04-04 NOTE — Progress Notes (Signed)
Laura Chang [id number] [birth year] 67 y.o.  Subjective:   Patient ID:  Laura Chang is a 67 y.o. (DOB [birth year]) female.  Chief Complaint:  Chief Complaint  Patient presents with   Follow-up   Depression   Anxiety   ADD      Laura Chang presents to the office today for follow-up of depression and anxiety and ADD.  seen November 12, 2019.  Melted down in 2020.  Went to [facility] in July.  No withdrawal.  1 drink since then.  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 She was on no stimulant and no SSRI but was taking Strattera and Wellbutrin.  The following changes were made. Stop Strattera. OK restart stimulant bc severe ADD Restart Adderall 1 daily for a few days and if tolerated then restart 1 twice daily. If not tolerated reduce the dosage if needed. May need to stop Wellbutrin if not tolerating the stimulant.  Yes.  DC Wellbutrin Restart Prozac 20 mg daily.  February 2021 appointment with the following noted: Completed grant proposal.  Couldn't doit without Adderall.  Sold a bunch of work.   Adderall XR lasts about 3 pm.  Strength seems about right.  BP been OK.  Not jittery.   Stopped Wellbutrin but had no SE. Mood drastically better with grant proposal and back on fluoxetine.  Less depressed and lethargic.  No anxiety.  Cut back on coffee. Started back with devotions and stronger faith. Plan: Continue Prozac 20 mg daily. May have to increase the dose at some point in the future given that she usually was taking higher dosages but she is getting good response at this time. Restart Wellbutrin off label for ADD since can't get 2 ADDERALL daily. 150 mg daily then 300 mg daily. She can adjust the dose between 150 mg and 300 mg daily to get the optimal effect.   05/11/2020 appointment with the following noted: Has been inconsistent with Prozac and Wellbutrin. Not sure of the effect of Wellbutrin. Biggest deterrent in work is anxiety.   Some of the work is conceptual and difficult at times.  Can feel she's not up to a project at times.  Overall is OK but would like a steadier benefit from stimulant.  Exhausted from managing concentration and keeping up with things from the day.  Loses things.  Not good keeping up with schedule. Overall productive and emotionally OK. Can feel Adderall wear off. Mood is better in summer and worse in the winter.   F died in 10/10/2023 and that is a loss. No SE Wellbutrin. Still attends AA meetings.  Real benefit from [facility] last year. Recognizes effect of anemia on ADD and mood.  Had iron infusions last winter. Plan:  Wellbutrin off label for ADD since can't get 2 ADDERALL daily. 150 mg daily then 300 mg daily.  01/24/2021 appointment with following noted: Doing a program called [employer] mindfulness app since Xmas.  CBT app helped the depression.  App helped her focus better.  Lost sign weight. Writing a lot. Before Xmas felt depressed and started negative thinking worse, self denigrating. Not drinking. More isolated.   Recognizes mo is narcissist.    Didn't tell anyone she was born until 3 mos later.  M aloof and uninterested in pt.  Lied about her birthday.  Mo lack of affection even with pt's kids. Going to AA for a year and it helped her to quit drinking. Also misses kids being gone  with a hole also. Plan: No med changes  05/04/2021 appointment with the following noted: Therapist [name] thinks she's manic. Lost weight to 144#.   States she is still sleeping okay.  Admits she is hyper and recognizes that she is likely manic.  She feels great, euphoric with an increased sense of spiritual connectedness to God.  She has racing thoughts and talks fast and talks a lot and this is noted by her husband.  He thinks she is a bit hyper.  She has been able to maintain sobriety although she will have 1 glass of wine on special occasions but does not drink by herself.  She is not drinking to  excess.  She denies any dangerous impulsivity.  She is clearly not depressed and not particularly anxious.  She has no concerns about her medication and she has been compliant.  06/16/21 appt noted: So much better.  Going through a lot but the manic thing happened on top of it.  So much slower.  Didn't feel like losing anything with risperidone.  Likes the Adderalll at 10 mg. Some drowsiness in the AM and very drowsy from risperidone 2 mg HS. Prayer life is better. Handling stress better. Less depressed with risperidone. Still likes trazodone. Sleeps well. Plan: Reduce Prozac to 10 mg daily.  Consider stopping it because it can feel the mania however she is reluctant to do that because she fears relapse of depression. Reduce risperidone to 1.5 mg nightly due to side effects.  Discussed risk of worsening mania.  07/25/2021 appointment with the following noted: Misses the Adderall and hard to function without it. Depressed now. Heavy chest.  Anxious and guilty.  Body feels heavy.   Hates Wellbutrin.   Plan: Increase fluoxetine to 20 mg daily Add Abilify 1/2 of 15 mg tablet daily Wean wellbutrin by 1 tablet each week  bc she feels it is not helpful and DT polypharmacy Reduce risperidone to 1 daily for 1 week and stop it. Disc risk of mania. Increase Adderall to XR 20 mg AM  08/08/21 Much less depressed and starting to feel normal I feel a lot better. No SE.  Speech normal off risperidone. Sleeping OK on trazaodone and enough.   Noticed benefit from Adderall again. Plan: continue fluoxetine to 20 mg daily Continue Abilify 1/2 of 15 mg tablet daily for depression and mania continue Adderall to XR 20 mg AM  10/10/2021 phone call: Pt stated she feels like the Abilify should be decreased to 5mg .She said she is depressed but rational and not suicidal.She has an appt Monday and can wait until then if you prefer. MD response: Reduce the Abilify to 7.5 mg every other day.  We will meet on 10/16/2021  and decide what to do from there.  10/16/2021 appointment with the following noted: More depressed.  Most depressed I've ever been.  Just numb.  Sense of grief.   Thinks the manic episode was unlike anything else she ever had.  Doesn't want to medicate against it.  Don't enjoy people.  Easily overwhelmed.  Had some death thoughts but not suicidal.  Has been functional.  Feels better today after reducing Abilify to every other day but she is only been doing that for 3 days. A/P: Episode of post manic depression was explained. continue fluoxetine to 20 mg daily Hold Abilify for 1 week then resume Abilify 1/2 of 15 mg tablet every other day for depression and mania continue Adderall to XR 20 mg AM  10/27/2021  appointment with the following noted: I'm doing so much better.  Handling the depression better. Better self talk and spiritual focus has helped.   Dep 6/10 manifesting as anxiety with low confidence.   F died 2  years ago and M 67 yo and is dependent . She is working hard to feel better but still feels depressed.  She almost feels like she has a little more anxiety since restarting Abilify every other day. Plan: continue fluoxetine to 20 mg daily DC Abilify .  Vrayalar 1.5 mg QOD to try to get rid of depression ASAP. continue Adderall to XR 20 mg AM  11/10/2021 appointment with the following noted: Busy with Xmas and it was fun with family but then a big let down.  Did well with it.  Functioned well with it.  Working hard on things with depression.  Not shutting down. Not sure but feels better today but yesterday was hard.  Difficulty dealing with mother.  She won't do anything to help herself.  Yesterday with her all day.  Won't do PT and has isolated herself.    Lack of confidence.   No SE with Vraylar.  11/24/21 urgent appointment appt noted: More and more depressed.   So anxious and doesn't want to be alone but can do so. No appetite. Hurts inside. Has had some fleeting suicidal  thoughts but would not act on them.  Tolerating meds. Has been consistent with Vraylar 1.5 mg every other day, fluoxetine 20 mg daily Plan: Increase Vraylar to 1.5 mg daily Change Prozac to Trintellix 10 mg daily. Discussed side effects of each continue Adderall to XR 20 mg AM  12/27/2021 appointment with the following noted: Not OK.  I feel less depressed but feels bat shit. Not sleeping well.  Extremely anxious. Off and on sleep. 3-4 hours of sleep.   Still having daily SI.  But also become obvious has so much to do.  Overwhelmed by tasks.   Needs anxiety meds to just function. Not more motivated.  Walked yesterday.   Feels afraid like in trouble but not irritable or angry. DC DT agitation Vraylar to 1.5 mg daily Change Prozac to Trintellix 10 mg daily. Hold Adderall to XR 20 mg AM Clonidine 0.1 1/2 tablet twice daily for 2 days and if needed for anxiety and sleep increase to 1 twice daily Ok temporary Ativan 1 mg 3 times daily as needed anxiety  01/05/22 appt noted: Off fluoxetine and  Trintellix.  Only on Ativan, trazodone and Adderall XR 20 plus added clonidine 0.1 mg BID Didn't think she needed to start Trintellix. Not taking Ativan.   Didn't like herself last week. Feels some better today. Wonders if the manic sx Not agitated.  Anxiety kind of calmed down.  A lot to be anxious about situationally.  $ stress. Concerns about downers with meds. Can't access normal personality. ? Lethargy and inability to talk as sE. Plan: Latuda 20-40 mg daily with food. Adderall to XR 20 mg AM Clonidine 0.1 1/2 tablet twice daily  reduce dose to be sure no SE Ok temporary Ativan 1 mg 3 times daily as needed anxiety  01/19/22 appt noted: Taking Latuda 20 mg daily.  Took 40 mg once and felt anxious and  SI Still depressed and not very reactive Anxiety mainly about the depression and fears of the future. She wants to revisit manic sx and thinks it was maybe bc taking delta 8 bc was taking a lot  of it so still  doesn't think she's classic bipolar. She wants to only take Prozac bc thinks Latuda is perpetuating depression. Says the delta 8 was very psychaedelic.  When not taking it was not manic.  Sleeping ok again.  Plan: Per her request DC Latuda 20-40 mg daily with food. She wants to continue Prozac alone AMA  Adderall to XR 20 mg AM Clonidine 0.1 1/2 tablet twice daily  reduce dose to be sure no SE Ok temporary Ativan 1 mg 3 times daily as needed anxiety  01/23/2022 phone call complaining of increased anxiety since stopping Latuda.  She will try increasing clonidine.  01/26/2022 phone call not feeling well and wanted to restart the Vraylar.  However notes indicate that had made her agitated therefore she was encouraged to pick up samples of Rexulti 1 mg and start that instead.  02/06/2022 phone call: Stating she felt the Rexulti was helping with depression but she was not sleeping well and obsessing over things.  She was encouraged to increase Rexulti to 2 mg daily and increase trazodone for sleep.  02/09/2022 appointment with the following noted: This was an urgent work in appointment No sleep last night with trazodone 100 mg HS Nothing really better depression or anxiety. Ruminating negative anxious thoughts. Did not tolerate Rexulti because it was causing insomnia.  Does not think it helped depression.  Lacks emotion that she should have.  Lacks her usual personality.  Some hopeless thoughts.  Some death thoughts.  Some suicidal thoughts without plan or intent Plan: DC Rexulti and Prozac & DC trazodone Adderall to XR 20 mg AM Clonidine 0.1 1/tablet twice daily  reduce dose to be sure no SE Ok temporary Ativan 1 mg 3 times daily as needed anxiety Start Seroquel XR 150 mg nightly  03/02/2022 appointment: Angelique Blonder called back a few days after starting Seroquel stating it was making her more anxious and more depressed.  This seemed unlikely as this medicine rarely ever causes anxiety.   She stopped the medication waited 3 days and called back still had anxiety and depression but thought perhaps the anxiety was a little better.  She did not want to take the Seroquel. She knew about the option of Spravato and wanted to pursue that. Now questions whether to return to Seroquel while waiting to start Spravato bc feels just as bad without it and knows she didn't give it enough time to work.   MADRS 46  ECT-MADRS    Flowsheet Row Clinical Support from 01/08/2023 in [facility] Clinical Support from 08/06/2022 in [facility] Clinical Support from 07/04/2022 in [facility] Clinical Support from 05/21/2022 in [facility] Office Visit from 03/02/2022 in [facility]  MADRS Total Score 21 29 15 27  46      03/14/22 appt noted: Pt received Spravato 56 mg first dose today with some dissociative sx which were not severe.  She was anxious prior to the administration and felt better after receiving lorazepam 1 mg.  No NV, or HA. Wants to continue Spravato. Ongoing depression and desperate to feel better.  I'm not myself DT deprsssion which is most severe in recent history.  Anhedonia.  Low motivation.  Social avoidance. Continues to think all recent med trials are making her worse.  Sleep ok with Seroquel.  03/16/22 appt noted: Received Spravato 84 mg for the first time.  some dissociative sx which were not severe.  She was anxious prior to  the administration and felt better after receiving lorazepam 1 mg.  No NV, or HA. Wants to continue Spravato.   Does not feel any better or different since the last appt.  Ongoing depression.  Ongoing depression and desperate to feel better.  I'm not myself DT deprsssion which is most severe in recent history.  Anhedonia.  Low motivation.  Social avoidance. Continues to think all recent med trials are making her worse.  Sleep  ok with Seroquel.  Does not want to continue Seroquel for TRD.  03/20/2022 appointment noted: Came for Spravato administration today.  However blood pressure was significantly elevated approximately 180/115.  She was given lorazepam 1 mg and clonidine 0.2 mg to try to get it down. She states she regretted stopping the Seroquel XR 300 mg tablets.  She now realizes it was helpful.  She did not sleep much at all last night.  She did not take the Adderall this morning. 2 to 3 hours after arrival blood pressure was still elevated at  170/110, 62 pulse.  For Spravato administration was canceled for today.  She admits to being anxious and depressed.  She is not suicidal.  She is highly motivated to receive the Spravato.  We discussed getting it tomorrow.  03/22/2022 appointment noted: Patient's blood pressure was never stable enough yesterday in order to get her in for Spravato administration.  She was encouraged to see her primary care doctor.  It is better today.  03/26/2022 appointment with the following noted: Blood pressure was better.  Saw her primary care doctor who started on oral Bystolic 2.5 mg daily. Received Spravato 84 mg today as scheduled.  Tolerated it well without nausea or vomiting headache or chest pain or palpitations.  Her blood pressure was borderline but manageable. She remains depressed and anxious.  She is ambivalent about the medicine and desperate to get to feel better.  Continues to have anhedonia and low energy and low motivation and reduced ability to do things.  Less social.  Not suicidal.  03/28/22 appt noted: Received Spravato 84 mg today as scheduled.  Tolerated it well without nausea or vomiting headache or chest pain or palpitations.  Her blood pressure was borderline but manageable. Has not seen any improvement so far.  Tolerating Seroquel.  Inconsistent with Bystolic and BP has been borderline high. Still depressed and anxious and anhedonia.  Low motivation, energy,  productivity. Taking quetiapine and tolerating XR 300 mg nightly.  04/04/22 appt noted: Received Spravato 84 mg today as scheduled.  Tolerated it well without nausea or vomiting headache or chest pain or palpitations.  Her blood pressure was borderline but manageable. Has not seen any improvement so far.  Tolerating Seroquel.   She still tends to think that the medications are making her worse.  She has said this about each of the recent psychiatric medicines including Seroquel.  However her husband thinks she is improved.  She also admits there is some improvement in productivity.  She still feels highly anxious.  She still does not enjoy things as normal.  She still feels desperate to improve as soon as possible. Has been taking Seroquel XR since 03/20/2022  04/10/22 appt noted: Received Spravato 84 mg today as scheduled.  Tolerated it well without nausea or vomiting headache or chest pain or palpitations.  Her blood pressure was borderline but manageable. Has not seen any improvement so far.  Tolerating Seroquel.  Doesn't like Seroquel bc she thinks it flattens here. 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  Tolerated it well without nausea or vomiting headache or chest pain or palpitations.  Her blood pressure was borderline but manageable. Has not seen any improvement so far.  Tolerating Seroquel.  Doesn't like Seroquel bc she thinks it flattens her. Received Spravato 84 mg today as scheduled.  Tolerated it well without nausea or vomiting headache or chest pain or palpitations.  Her blood pressure was borderline but manageable. Has not seen any improvement so far.  Tolerating Seroquel.  Doesn't like Seroquel bc she thinks it flattens here.  We discussed her ambivalence about it. She is starting Auvelity and has tolerated it  the last 2 days without side effect.  She still does not feel like herself and feels flat and not enjoying things with suppressed expressed emotion  04/17/2022 appointment with the following noted: Received Spravato 84 mg today as scheduled.  Tolerated it well without nausea or vomiting headache or chest pain or palpitations.  Her blood pressure was borderline but manageable. Has not seen any improvement so far.  Tolerating Seroquel.  Doesn't like Seroquel bc she thinks it flattens her. She has been tolerating the Auvelity 1 in the morning without side effects for about a week.  She has not noticed significant improvement so far.  She still feels depressed and flat and not herself.  Other people notice that she is flat emotionally.  She is not suicidal.  She does feel discouraged that she is not getting better yet.  04/19/2022 appointment noted: Has increased Auvelity to 1 twice daily for 2 days, continues quetiapine XR 300 mg nightly, clonidine 0.3 mg twice daily, lorazepam 1 mg twice daily for anxiety and Adderall XR 20 mg in the morning. No obious SE but she still thinks quetiapine XR is making her feel down.  But not sedated Received Spravato 84 mg today as scheduled.  Tolerated it well without nausea or vomiting headache or chest pain or palpitations.  Her blood pressure was borderline but manageable. She still feels quite anxious and feels it necessary to take both the clonidine and lorazepam twice a day to manage her anxiety.  She has been consistently down and flat and not herself until yesterday afternoon she noted an improvement in mood and feeling much more like herself with her normal personality reemerging.  She was quite depressed in the morning with very dark negative thoughts.  She did not have those dark negative thoughts this morning.  She had a lot of questions about medication and when she was expecting to be improved and why she has not shown improvement up to now.  04/23/22 appt  noted: Has increased Auvelity to 1 twice daily for 1 week, continues quetiapine XR 300 mg nightly, clonidine 0.3 mg twice daily, lorazepam 1 mg twice daily for anxiety and Adderall XR 20 mg in the morning. No obious SE but she still thinks quetiapine XR is making her feel down.  But not sedated Received Spravato 84 mg today as scheduled.  Tolerated it well without nausea or vomiting headache or chest pain or palpitations.  She is still depressed but admits better function and is able to enjoy social interactions. Tolerating meds.  Would like to feel better for sure. Not herself.  Flat. Plan increase Auvelity to 1 tab BID as planned and reduce Quetiapine to 1/2 of ER 300 mg  bc NR for depression.  04/25/2022 appointment with the following noted: clonidine 0.3 mg twice daily, lorazepam 1 mg twice daily for anxiety and Adderall XR 20 mg in the morning. Seroquel XR 300 HS No obious SE but she still thinks quetiapine XR is making her feel down.  But not sedated Received Spravato 84 mg today as scheduled.  Tolerated it well without nausea or vomiting headache or chest pain or palpitations.  Called yesterday with more anxiety.  Had increased Auvelity for 1 day and reduced Seroquel XR for 1 day.  Felt restless and fearful  05/01/2022 appointment noted: clonidine 0.3 mg twice daily, lorazepam 1 mg twice daily for anxiety and Adderall XR 20 mg in the morning. Seroquel XR 150 HS, Auvelity 1 BID Received Spravato 84 mg today as scheduled.  Tolerated it well without nausea or vomiting headache or chest pain or palpitations.  Nurse has noted patient has called multiple times sometimes asking the same question repeatedly.  It is unclear whether she is truly forgetful or is just anxious seeking reassurance. Patient acknowledges ongoing depression as well as some anxiety but states she has felt a little better in the last couple of days.  She has reduced the Seroquel to 150 mg at night and has increased Auvelity to 1  twice daily but only for 1 day.  So far she seems to be tolerating it.  05/03/22 appt noted: clonidine 0.2 mg twice daily, lorazepam 1 mg twice daily for anxiety and Adderall XR 20 mg in the morning. Seroquel XR 150 HS, Auvelity 1 BID BP high this am about 170/100 and received extra clonidine 0.2 mg and came to receive Spravato.  Not dizzy, no SOB, nor CP but BP is still high Could not receive Spravato today bc BP high and pulse low at 30 ppm. Still depressed and anxious. Plan: continue trial Auvelity with Spravato She needs to get BP and pulse managed  05/08/22 TC: RTC  H Michael NA and mailbox full.  Could not leave message.  Pt  -  talked to she and H on speaker. H worried over wife.  Vacant stare.  Slurs words at times.  Not smiling. Reduced enjoyment.  Depression.  Withdrawn from usual activities.  Some irritability.  Anxious. Disc her concerns meds are making her worse.  Extensive discussion about her treatment resistant status.  There is a consistent pattern of not taking the medicines long enough to get benefit because she believes the meds are making her worse.  However the symptoms she describes as side effects are exactly the same symptoms that she had prior to taking the medication RX for  the depression.  So it is not clear that these are actual side effects. This is true about the 2 most recent meds including Seroquel and Auvelity.  Recommend psychiatric consultation in hopes of improving her comfort level with taking prescribed medications for a sufficient length of time to provide benefit. Extensive discussion about ECT is the treatment of choice for treatment resistant depression.  Spravato may work if she can comply with consistency.  There are medication options but they take longer to work.   Plan:  Reduce clonidine to 0.1 mg BID DT bradycardia.  Talk with PCP about BP and low pulse problems which are interfering with her consistent compliance with Spravato.   Limit lorazepam to  3 -4  mg daily max. Excess use is the cause of slurring speech.  She must stop excess use or will have to stop the med. Stop Auvelity per  her request.  But she has only been on the full dose for a little over a week and clearly has not had time to get benefit from it.  She thinks maybe it is making her more anxious. Reduce Seroquel from 150XR to 50 -100 mg at night IR.  She couldn't sleep when stopped it completely. Will not start new antidepressant until her SE issues are resolved or not. Get second psych opinion from Wellington Hampshire MD or another psychiatrist.  H's sister is therapist in [place] Rink, MD, [facility]  05/16/2022 appointment with the following noted: Received Spravato 84 mg today as scheduled.  Tolerated it well without nausea or vomiting headache or chest pain or palpitations.  She stopped Auvelity as discussed last week. On her own, without physician input, she restarted Wellbutrin XL 450 mg every morning today.  She had taken it in the past.  She feels jittery and anxious. She feels less depressed than she did last week.  But she is still depressed without her usual range of affect.  She still is less social and less motivated than normal. Her primary care doctor increased the dose of losartan Plan: Stop Seroquel Reduce Wellbutrin XL to 300 mg every morning.  Starting the dose at 450 every morning is likely causing side effects of jitteriness and it should not be started at that have a dosage. Recommend she not change meds on her own without MDM put  05/23/2022 appointment with the following noted: Received Spravato 84 mg today as scheduled.  Tolerated it well without nausea or vomiting headache or chest pain or palpitations.  Has not dropped seroquel XR 300 mg 1/2 tablet nightly bc couldn't sleep without it. Has not tried lower dose quetiapine 50 mg HS Still feels depressed.   BP is better managed so far, just saw PCP.  BP is better today and infact is low today. Dropped  clonidine as directed from 0.3 mg BID bc inadequate control of BP to 0.2 mg BID.  However she wants to increase it back to 0.3 mg twice daily because she feels it helped her anxiety better.  Wonders about increasing Wellbutrin for depression.  However she has only been on 300 mg a day for a week.  She was on 450 mg daily in the past.  06/06/22 appt noted: Received Spravato 84 mg today as scheduled.  Tolerated it well without nausea or vomiting headache or chest pain or palpitations.  She is still depressed and anxious.  She wants to try to stop the Seroquel but cannot sleep without some of it.  She is taking lorazepam 1 mg 4 times daily and still having a lot of anxiety.  She wants to increase clonidine back to 0.3 mg twice daily.  She hopes for more improvement She recently went for a second psychiatric opinion as suggested the results of that are pending.  06/11/22 appt noted: Received Spravato 84 mg today as scheduled.  Tolerated it well without nausea or vomiting headache or chest pain or palpitations.  She is still depressed and anxious. Without much change.  Still hopeless, anhedonia, reduced inteterest and motivation.  Tolerating meds. Disc concerns Spravato is not hleping much. Plan: stop Seroquel and start olanzapine 10 mg HS for TRD and anxiety.  06/13/2022 appointment noted: Received Spravato 84 mg today as scheduled.  Tolerated it well without nausea or vomiting headache or chest pain or palpitations.  She is still depressed and anxious. Without much change.  Still hopeless, anhedonia, reduced inteterest  and motivation.  Tolerating meds. Disc concerns Spravato is not helping much as hoped but is improving a bit in the last week. Tolerating meds. Continues Wellbutrin XL 450 AM, tolerating recently started olanzapine  10 mg HS. Sleep is good.   Pending appt with TMS consult.  06/18/22 appt noted: Received Spravato 84 mg today as scheduled.  Tolerated it well without nausea or vomiting  headache or chest pain or palpitations.  Tolerating meds. Continues Wellbutrin XL 450 AM, tolerating recently started olanzapine  10 mg HS. Continues Adderall XR 20 amd and has tried to reduce lorazepam to 1mg  TID Sleep is good.   Pending appt with TMS consult. Depression is a little bit better in the last week with a little improvement in emotional expression and interest.  She is pushing herself to be more active.  Her daughter thought she was a little better than she has been.  However she is still depressed and still not her normal self with anhedonia and reduced emotional expressiveness.  06/20/22 appt noted: Received Spravato 84 mg today as scheduled.  Tolerated it well without nausea or vomiting headache or chest pain or palpitations.  Tolerating meds with a little sleepiness. Continues Wellbutrin XL 450 AM, tolerating recently started olanzapine  10 mg HS. Continues Adderall XR 20 amd and has tried to reduce lorazepam to 1mg  TID Sleep is good.   Mood is improving.  Better funciton.  Anxiety is better with olanzapine. Still not herself and depression not gone with some anhedonia and social avoidance and feeling overwhelmed.  8/14 2023 received Spravato 84 mg 06/27/2022 received Spravato Spravato 84 mg 07/02/2022 received Spravato 84 mg 07/04/2022 received Spravato 84 mg  07/09/2022 appointment noted: Received Spravato 84 mg today as scheduled.  Tolerated it well without nausea or vomiting headache or chest pain or palpitations.  Expected dissociation and feels less depressed with resolution of negative emotions immediately after Spravato and then depression, anxiety creep back in. Continues meds Adderall XR 20 mg every morning, Wellbutrin XL 450 every morning, clonidine 0.1 mg twice daily, lorazepam 1 mg every 6 hours as needed, olanzapine increased from 7.5 to 10 mg nightly on  Tolerating meds.  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  She does note some increased appetite and is somewhat concerned about that but has not gained significant amounts of weight. She has had the TMS consultation which was initially denied but she knows it can be appealed.  However because she is improving with Spravato plus the other medications now she wants to continue the current treatment plan.  07/18/22 appt noted: Continues meds Adderall XR 20 mg every morning, Wellbutrin XL 450 every morning, clonidine 0.1 mg twice daily, lorazepam 1 mg every 6 hours as needed, olanzapine increased from 7.5 to 10 mg nightly on 07/04/2022. Received Spravato 84 mg today as scheduled.  Tolerated it well without nausea or vomiting headache or chest pain or palpitations.  Expected dissociation and feels less depressed with resolution of negative emotions immediately after Spravato and then depression, anxiety creep back in. Continues meds Adderall XR 20 mg every morning, Wellbutrin XL 450 every morning, clonidine 0.1 mg twice daily, lorazepam 1 mg every 6 hours as needed, olanzapine increased from 7.5 to 10 mg nightly on  Tolerating meds.  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  She does note some increased appetite and is somewhat concerned about that but has not gained significant amounts of weight. She has had the TMS consultation which was initially denied but she knows it can be appealed. She continues to have chronic ambivalence about psychiatric medicines and initially tends to blame her depressive symptoms such as decreased concentration and feeling flat on what ever medicine she currently is taking even though she had the same symptoms before the current medicines were started.  Then after discussion she does admit that her depressive symptoms are improved since adding  olanzapine but still has those residual symptoms noted.  07/23/22 received Spravato 84 mg   07/30/2022 appointment noted: Received Spravato 84 mg today as scheduled.  Tolerated it well without nausea or vomiting headache or chest pain or palpitations.  Expected dissociation and feels less depressed with resolution of negative emotions immediately after Spravato and then depression, anxiety creep back in. Continues meds Adderall XR 20 mg every morning, Wellbutrin XL 450 every morning, clonidine 0.1 mg twice daily, lorazepam 1 mg every 6 hours as needed, olanzapine increased from 7.5 to 10 mg nightly on  She has been inconsistent with olanzapine because she continues to be ambivalent about the medications in general and thinks that perhaps the 10 mg is making her feel blunted.  She continues to feel some depression.  She had a good day this week and but still feels somewhat depressed and persistently anxious. Plan: be consistent with olanzapine 10 mg HS for TRD and longer trial for potential benefit for anxiety.  Has not taken it consistently.  08/06/2022 appointment noted: Received Spravato 84 mg today as scheduled.  Tolerated it well without nausea or vomiting headache or chest pain or palpitations.  Expected dissociation and feels less depressed with resolution of negative emotions immediately after Spravato and then depression, anxiety creep back in. Continues meds Adderall XR 20 mg every morning, Wellbutrin XL 450 every morning, clonidine 0.1 mg twice daily, lorazepam 1 mg every 6 hours as needed, olanzapine i 10 mg nightly  She continues to feel depressed but is about 50% better with Spravato.  She is still not herself.  She still has anhedonia.  She still is not her able to engage socially in the typical ways.  She is not jovial and outgoing like normal.  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  She continues to feel that her personality is dampened down.  There is a question about whether it is related to depression or medication. Plan: continue olanzapine 10 for longer trial for TRD and severe anxiety.  08/13/22 appt noted:  Received Spravato 84 mg today as scheduled.  Tolerated it well without nausea or vomiting headache or chest pain or palpitations.  Expected dissociation and feels less depressed with resolution of negative emotions immediately after Spravato and then depression, anxiety creep back in. Continues meds Adderall XR 20 mg every morning, Wellbutrin XL 450 every morning, clonidine 0.1 mg twice daily, lorazepam 1 mg every 6 hours as needed, olanzapine i 10 mg nightly  She still does not feel herself.  Still struggles with depression and low motivation and reduced social engagement and reduced interest and reduced emotional expression.  She is somewhat better with the medicines plus Spravato.  She still believes the Spravato makes her blunted and  is not sure how much it helps her anxiety.  She can have good days when her family is around and she is engaged.  She still wants to stop the olanzapine. She has apparently continued to take the trazodone despite having been told to stop it when she started olanzapine.  She feels like she needs the trazodone. Plan: DC olanzapine and Start nortriptyline 25 mg nightly and build up to 75 mg nightly and then check blood level.    08/27/2022 appointment noted: Received Spravato 84 mg today as scheduled.  Tolerated it well without nausea or vomiting headache or chest pain or palpitations.  Expected dissociation and feels less depressed with resolution of negative emotions immediately after Spravato and then depression, anxiety creep back in. Continues meds Adderall XR 20 mg every morning, Wellbutrin XL 450 every morning, clonidine 0.1 mg twice daily, lorazepam 1 mg every 6 hours as needed. Stopped olanzapine and started  nortriptyline which she has taken for about a week is 75 mg nightly. So far she is tolerating the nortriptyline well with the exception of some dry mouth and constipation which she is working to manage.  She does not feel substantially better better or different off the olanzapine.  No change in her sleep which is good.  Main concern currently in addition to the residual depression is anxiety which is somewhat situational with pending arch show.  She is worrying about it more than normal.  Says she is having to take lorazepam twice a day where she had been able to keep reduce it prior to this.  She still does not feel like herself with residual depression with less social interest and less of her usual buoyancy in personality.  She is flatter than normal.  Overall she still feels that the Spravato has been helpful at reducing the severity of the depression.  She is not suicidal. She has not heard anything about the TMS appeal as of yet.  09/05/2022 appointment noted: Received Spravato 84 mg today as scheduled.  Tolerated it well without nausea or vomiting headache or chest pain or palpitations.  Expected dissociation and feels less depressed with resolution of negative emotions immediately after Spravato and then depression, anxiety creep back in. Continues meds Adderall XR 20 mg every morning, Wellbutrin XL 450 every morning, clonidine 0.1 mg twice daily, lorazepam 1 mg every 6 hours as needed. Stopped olanzapine and started nortriptyline which she has taken for about 2 week is 75 mg nightly. Initially blood pressure was a little high causing delay in starting Spravato.  She admitted to feeling a little wound up.  She still experiences a little increase in depression if she goes longer than a week in between doses of Spravato.  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  09/10/22 appt noted: Received  Spravato 84 mg today as scheduled.  Tolerated it well without nausea or vomiting headache or chest pain or palpitations.  Expected dissociation gradually resolved over the 2 hour observation period. She feels 50% less depressed with Spravto and wants to continue it.   Continues meds Adderall XR 20 mg every morning, Wellbutrin XL 450 every morning, clonidine 0.1 mg twice daily, lorazepam 1 mg every 6 hours as needed. Has started nortriptyline 75 mg nightly for about 3 weeks. Has not seen a significant difference with the addition of nortriptyline.  Tolerating it pretty well. She continues  to have some degree of anhedonia and significant depression and anxiety.  Her daughters noticed that she is more needy and calls more frequently.  She acknowledges this as well.  She is clearly still not herself. Plan: pramipexole off label and RX 0.25 mg BID  09/17/2022 appointment noted: Received Spravato 84 mg today as scheduled.  Tolerated it well without nausea or vomiting headache or chest pain or palpitations.  Expected dissociation gradually resolved over the 2 hour observation period. She feels 50% less depressed with Spravto and wants to continue it.   Continues meds Adderall XR 20 mg every morning, Wellbutrin XL 450 every morning, clonidine 0.1 mg twice daily, lorazepam 1 mg every 6 hours as needed. Has started nortriptyline 75 mg nightly for about 3 weeks and DT level 176, reduced to 50 mg HS early November. Still the same sx as noted last visit.  Tolerating meds.   Compliant.  Still depressed and family notices.  Has been able to participate in family interactions.  Some post-show let down and has to do detailed work which is hard for her bc ADD.  Sleep and eating well.  Energy OK but not great.  No SI.  Not cried in a year or so.  Clearly less depressed and hopeless than before the Spravato.  09/24/22 appt noted: Received Spravato 84 mg today as scheduled.  Tolerated it well without nausea or vomiting  headache or chest pain or palpitations.  Expected dissociation gradually resolved over the 2 hour observation period. She feels 50% less depressed with Spravto and wants to continue it.   Continues meds Adderall XR 20 mg every morning, Wellbutrin XL 450 every morning, clonidine 0.1 mg twice daily, lorazepam 1 mg every 6 hours as needed. Has started nortriptyline 75 mg nightly for about 3 weeks and DT level 176, reduced to 50 mg HS early November. Still the same sx as noted last visit.  Tolerating meds.   Compliant.  Still depressed and family notices.  Has been able to participate in family interactions.  Some post-show let down and has to do detailed work which is hard for her bc ADD.  Sleep and eating well.  Energy OK but not great.  No SI.  Not cried in a year or so.  Clearly less depressed and hopeless than before the Spravato. Is not making further progress generally.  Stuck with moderate depression  10/02/22 appt noted: Received Spravato 84 mg today as scheduled.  Tolerated it well without nausea or vomiting headache or chest pain or palpitations.  Expected dissociation gradually resolved over the 2 hour observation period. She feels 50% less depressed with Spravto and wants to continue it.   Continues meds Adderall XR 20 mg every morning, Wellbutrin XL 450 every morning, clonidine 0.1 mg twice daily, lorazepam 1 mg every 6 hours as needed. Has started nortriptyline 75 mg nightly for about 3 weeks and DT level 176, reduced to 50 mg HS early November. Still the same sx as noted last visit.  Tolerating meds.   Compliant.  Still depressed and family notices.  Has been able to participate in family interactions.  Some post-show let down and has to do detailed work which is hard for her bc ADD.  Sleep and eating well.  Energy OK but not great.  No SI.  Not cried in a year or so.  Clearly less depressed and hopeless than before the Spravato. Is not making further progress generally.  Stuck with moderate  depression.  Is  able to function pretty normally. Plan: trial pramipexole 0.25 mg BID off label for depression.   10/08/22 appt noted: Received Spravato 84 mg today as scheduled.  Tolerated it well without nausea or vomiting headache or chest pain or palpitations.  Expected dissociation gradually resolved over the 2 hour observation period. She feels 50% less depressed with Spravto and wants to continue it.   Continues meds Adderall XR 20 mg every morning, Wellbutrin XL 450 every morning, clonidine 0.1 mg twice daily, lorazepam 1 mg every 6 hours as needed. Has started nortriptyline 75 mg nightly for about 3 weeks and DT level 176, reduced to 50 mg HS early November. Still the same sx as noted last visit.  Tolerating meds.   Compliant.  Still depressed and family notices.  Has been able to participate in family interactions.  Some post-show let down and has to do detailed work which is hard for her bc ADD.  Sleep and eating well.  Energy OK but not great.  No SI.  Not cried in a year or so.  Clearly less depressed and hopeless than before the Spravato. Is not making further progress generally.  Stuck with moderate depression.  Behaved and felt pretty normally with family over for Thanksgiving. Doesn't see benefit or SE with pramipexole but thinks maybe it makes her worse. Plan:  increase pramipexole augmentation off label to 0.5 mg BID  10/15/2022 appointment noted: Received Spravato 84 mg today as scheduled.  Tolerated it well without nausea or vomiting headache or chest pain or palpitations.  Expected dissociation gradually resolved over the 2 hour observation period. She feels 50% less depressed with Spravto and wants to continue it.   Continues meds Adderall XR 20 mg every morning, Wellbutrin XL 450 every morning, clonidine 0.2 mg twice daily, lorazepam 1 mg every 6 hours as needed.  Trazodone 50 mg tablets 1-2 nightly as needed insomnia Has started nortriptyline 75 mg nightly for about 3 weeks  and DT level 176, reduced to 50 mg HS early November. Recommended increase pramipexole 0.5 mg twice daily off label for treatment resistant depression on 10/08/2022. She feels better motivated more active with pramipexole 0.5 mg twice daily.  She still is depressed but it is better.  We discussed the possibility of going up in the dose but did not change it.  10/22/2022 appointment noted: Received Spravato 84 mg today as scheduled.  Tolerated it well without nausea or vomiting headache or chest pain or palpitations.  Expected dissociation gradually resolved over the 2 hour observation period. She feels 50% less depressed with Spravto and wants to continue it.   Continues meds Adderall XR 20 mg every morning, Wellbutrin XL 450 every morning, clonidine 0.2 mg twice daily, lorazepam 1 mg every 8 hours as needed.  Trazodone 50 mg tablets 1-2 nightly as needed insomnia Has started nortriptyline 75 mg nightly for about 3 weeks and DT level 176, reduced to 50 mg HS early November. pramipexole 0.5 mg twice daily off label for treatment resistant depression on 10/08/2022. She is better motivated than she was.  She is journaling 3 pages a day.  She has started walking and has walked 5 days a week for 50 minutes for the last 2 weeks.  That is significantly helped her mood.  Her mood tends to be better when she interacts with family.  However she still has some periods of depression.  She is tolerating the medications well.  She still feels like her affect and confidence is not  back to normal. Plan:  increase pramipexole augmentation off label to 0.5 mg tID or 0.75 mg BID   10/29/22 appt noted: Received Spravato 84 mg today as scheduled.  Tolerated it well without nausea or vomiting headache or chest pain or palpitations.  Expected dissociation gradually resolved over the 2 hour observation period. She feels 50% less depressed with Spravto and wants to continue it.   Continues meds Adderall XR 20 mg every  morning, Wellbutrin XL 450 every morning, clonidine 0.2 mg twice daily, lorazepam 1 mg every 8 hours as needed.  Trazodone 50 mg tablets 1-2 nightly as needed insomnia Has started nortriptyline 75 mg nightly for about 3 weeks and DT level 176, reduced to 50 mg HS early November. pramipexole increased to 0.75 mg twice daily off label for treatment resistant depression  She has been feeling somewhat better with the increase in pramipexole and is tolerating it well.  She still is easily overwhelmed.  Her affect and mood can improve now when around her family or doing something positive.  She has been able to be more productive. She is tolerating the current medications. She is still considering TMS as an alternative to Spravato.  11/15/2022 appointment noted: Received Spravato 84 mg today as scheduled.  Tolerated it well without nausea or vomiting headache or chest pain or palpitations.  Expected dissociation gradually resolved over the 2 hour observation period. She feels 50% less depressed with Spravto and wants to continue it.   Continues meds Adderall XR 20 mg every morning, Wellbutrin XL 450 every morning, clonidine 0.2 mg twice daily, lorazepam 1 mg every 8 hours as needed.  Trazodone 50 mg tablets 1-2 nightly as needed insomnia Has started nortriptyline 75 mg nightly for about 3 weeks and DT level 176, reduced to 50 mg HS early November. pramipexole increased to 0.75 mg twice daily off label for treatment resistant depression  He has noticed some increase in depression due to the length of time since the last Spravato administration.  She believes Spravato is helping her.  sHe is not suicidal but has felt very blue the last few days. She is tolerating the medication. She is ambivalent about Spravato versus TMS but she is considering TMS. She wants to continue Spravato administration because it is clearly helpful.  11/19/22 appt noted: Received Spravato 84 mg today as scheduled.  Tolerated it well  without nausea or vomiting headache or chest pain or palpitations.  Expected dissociation gradually resolved over the 2 hour observation period. She feels 50% less depressed with Spravto and wants to continue it.   Continues meds Adderall XR 20 mg every morning, Wellbutrin XL 450 every morning, clonidine 0.2 mg twice daily, lorazepam 1 mg every 8 hours as needed.  Trazodone 50 mg tablets 1-2 nightly as needed insomnia Has started nortriptyline 75 mg nightly for about 3 weeks and DT level 176, reduced to 50 mg HS early November. pramipexole increased to 0.75 mg twice daily off label for treatment resistant depression  She has felt better back on Spravato more regularly.  However she still has residual depression esp when alone or when wihtout activity.  Can function when needed.   Does not have her connfidence back. She plans to pursue TMS availability. Have discussed retrying Auvelity  11/28/22 appt noted: Received Spravato 84 mg today as scheduled.  Tolerated it well without nausea or vomiting headache or chest pain or palpitations.  Expected dissociation gradually resolved over the 2 hour observation period. She feels 50% less  depressed with Spravto and wants to continue it.   Continues meds Adderall XR 20 mg every morning, Wellbutrin XL 450 every morning, clonidine 0.2 mg twice daily, lorazepam 1 mg every 8 hours as needed.  Trazodone 50 mg tablets 1-2 nightly as needed insomnia Has started nortriptyline 75 mg nightly for about 3 weeks and DT level 176, reduced to 50 mg HS early November. pramipexole increased to 0.75 mg twice daily off label for treatment resistant depression  Last week she was more depressed than usual for reasons that are not clear.  She has not had a clear answer from Filomena Jungling about [employer] options.  States that they have not returned her call.  She is asked about Auvelity retry in place of Wellbutrin.  Has still been walking.  12/03/22 appt noted: Received Spravato 84 mg today  as scheduled.  Tolerated it well without nausea or vomiting headache or chest pain or palpitations.  Expected dissociation gradually resolved over the 2 hour observation period. She feels 50% less depressed with Spravto and wants to continue it.   Continues meds Adderall XR 20 mg every morning, Wellbutrin XL 450 every morning, clonidine 0.2 mg twice daily, lorazepam 1 mg every 8 hours as needed.  Trazodone 50 mg tablets 1-2 nightly as needed insomnia started nortriptyline 75 mg nightly for about 3 weeks and DT level 176, reduced to 50 mg HS early November. pramipexole increased to 0.75 mg twice daily off label for treatment resistant depression  No SE .  Satisfied with meds. Depression is better in the last week.  Not sure why that is the case.  Still walking daily and that helps and journaling 3 pages daily.  Working on Biomedical scientist.  No changes desire.  Clearly benefits from Spravato but not 100%.  Still considering TMS.  12/10/22 appt noted: Received Spravato 84 mg today as scheduled.  Tolerated it well without nausea or vomiting headache or chest pain or palpitations.  Expected dissociation gradually resolved over the 2 hour observation period. She feels 50% less depressed with Spravto and wants to continue it.   Continues meds Adderall XR 20 mg every morning, Wellbutrin XL 450 every morning, clonidine 0.2 mg twice daily, lorazepam 1 mg every 8 hours as needed.  Trazodone 50 mg tablets 1-2 nightly as needed insomnia started nortriptyline 75 mg nightly for about 3 weeks and DT level 176, reduced to 50 mg HS early November. pramipexole increased to 0.75 mg twice daily off label for treatment resistant depression  No SE .   Depression was worse this week for no apparent reason.  She struggled being positive.  She has felt more discouraged.  She has felt more anxious.  She has had a hard time doing tasks.  She is interested in retrying the [place] which we had discussed previously. Plan: She agrees to  [employer].  To improve tolerability and reduce risk of side effects, Stop Wellbutrin and start Auvelity 1 in the morning for 1 week then 1 twice daily AndReduce pramipexole 0.5 mg BID and reduce nortriptyline to 50 mg HS  12/17/22 appt noted: Received Spravato 84 mg today as scheduled.  Tolerated it well without nausea or vomiting headache or chest pain or palpitations.  Expected dissociation gradually resolved over the 2 hour observation period. She feels 50% less depressed with Spravto and wants to continue it.   Continues meds Adderall XR 20 mg every morning, stopped Wellbutrin XL 150 every morning, clonidine 0.2 mg twice daily, lorazepam 1 mg every  8 hours as needed.  Trazodone 50 mg tablets 1-2 nightly as needed insomnia Has started nortriptyline 75 mg nightly for about 3 weeks and DT level 176, reduced to 50 mg HS early November. pramipexole 0.375 mg twice daily off label for treatment resistant depression with plan to stop Started Auvelity 1 AM Still depressed to moderate degree.  Tolerating Auvelity so far.  No other problems with meds.  Willing to give Auvelity a chance. Plan: She agrees to [employer].  Continue 1 AM and when tolerated then increase to BID Stop pramipexole.  12/26/22 received Spravato  01/01/23 appt noted: Received Spravato 84 mg today as scheduled.  Tolerated it well without nausea or vomiting headache or chest pain or palpitations.  Expected dissociation gradually resolved over the 2 hour observation period. She feels 50% less depressed with Spravto and wants to continue it.   Continues meds Adderall XR 20 mg every morning, stopped Wellbutrin XL 150 every morning, clonidine 0.2 mg twice daily, lorazepam 1 mg every 8 hours as needed.  Trazodone 50 mg tablets 1-2 nightly as needed insomnia Has started nortriptyline 75 mg nightly for about 3 weeks and DT level 176, reduced to 50 mg HS early November. Forgot to reduce pramipexole stoll taking  0.5 mg twice daily off  label for treatment resistant depression  Started Auvelity 1 AM & PM last week. Occ misses Spravato DT htn.this week a better than last.   Painting again more and it helps mood.  Confidence still low and not as likely to socialize as normal but enjoys family.   Still ambivalent about TMS. Tolerating meds.  Including the increase in [place].    01/08/23 appt noted: Received Spravato 84 mg today as scheduled.  Tolerated it well without nausea or vomiting headache or chest pain or palpitations.  Expected dissociation gradually resolved over the 2 hour observation period. She feels 50% less depressed with Spravto and wants to continue it.   Continues meds Adderall XR 20 mg every morning, stopped Wellbutrin XL 150 every morning, clonidine 0.2 mg twice daily, lorazepam 1 mg every 8 hours as needed.  Trazodone 50 mg tablets 1-2 nightly as needed insomnia Has started nortriptyline 75 mg nightly for about 3 weeks and DT level 176, reduced to 50 mg HS early November. reduced pramipexole to 0.5 mg daily off label for treatment resistant depression  Started Auvelity 1 AM & PM mid Feb. More depressed as week progresses.  Thinks it is worse with less pramipexole.  More negative.  Able to function but feels miserable.  No SI.  Hopeless.  Sleep ok.  Tolerating meds.   Talked to [facility] about TMS and appt mid March. Plan: increase pramipexole to 0.5 mg TID for dep bc seemed to worsen with the reduction.  01/15/23 appt noted: Received Spravato 84 mg today as scheduled.  Tolerated it well without nausea or vomiting headache or chest pain or palpitations.  Expected dissociation gradually resolved over the 2 hour observation period. She feels 50% less depressed with Spravto and wants to continue it.   Continues meds Adderall XR 20 mg every morning, stopped Wellbutrin XL 150 every morning, clonidine 0.2 mg twice daily, lorazepam 1 mg every 8 hours as needed.  Trazodone 50 mg tablets 1-2 nightly as needed  insomnia Has started nortriptyline 75 mg nightly for about 3 weeks and DT level 176, reduced to 50 mg HS early November. Increased pramipexole to 0.5 mg  to TID  off label for treatment resistant depression  Started Auvelity 1 AM & PM mid Feb. markedly better depression the increase in pramipexole.  She feels like her depression is almost resolved.  She is very pleased with the response.  She is tolerating the medications well  01/30/23 appt noted: Received Spravato 84 mg today as scheduled.  Tolerated it well without nausea or vomiting headache or chest pain or palpitations.  Expected dissociation gradually resolved over the 2 hour observation period. She feels 50% less depressed with Spravto and wants to continue it.   Continues meds Adderall XR 20 mg every morning, stopped Wellbutrin XL 150 every morning, clonidine 0.2 mg twice daily, lorazepam 1 mg every 8 hours as needed.  Trazodone 50 mg tablets 1-2 nightly as needed insomnia Has started nortriptyline 75 mg nightly for about 3 weeks and DT level 176, reduced to 50 mg HS early November. Increased pramipexole to 0.5 mg  to TID  off label for treatment resistant depression  Started Auvelity 1 AM & PM mid Feb. Mood markedly better with pramipexole added.  Nearly normal mood now with minimal depression.  More social and outgoing and motivated and resolved anhedonia. No SE.  Compliant.  02/04/23 appt noted: Continues meds Adderall XR 20 mg every morning, stopped Wellbutrin XL 150 every morning, clonidine 0.2 mg twice daily, lorazepam 1 mg every 8 hours as needed.  Trazodone 50 mg tablets 1-2 nightly as needed insomnia Has started nortriptyline 75 mg nightly for about 3 weeks and DT level 176, reduced to 50 mg HS early November. Increased pramipexole to 0.5 mg  to TID  off label for treatment resistant depression  Started Auvelity 1 AM & PM mid Feb. Received Spravato 84 mg today as scheduled.  Tolerated it well without nausea or vomiting headache  or chest pain or palpitations.  Expected dissociation gradually resolved over the 2 hour observation period. She feels 50% less depressed with Spravto and wants to continue it.  The pramipexole got her the rest of the way better.  Doesn't want to cut back on any tx bc afraid of relapse. Tolerating meds.  02/15/23 appt noted: Continues meds Adderall XR 20 mg every morning, stopped Wellbutrin XL 150 every morning, clonidine 0.2 mg twice daily, lorazepam 1 mg every 8 hours as needed.  Trazodone 50 mg tablets 1-2 nightly as needed insomnia Has started nortriptyline 75 mg nightly for about 3 weeks and DT level 176, reduced to 50 mg HS early November. Increased pramipexole to 0.5 mg  to TID  off label for treatment resistant depression  Started Auvelity 1 AM & PM mid Feb. Received Spravato 84 mg today as scheduled.  Tolerated it well without nausea or vomiting headache or chest pain or palpitations.  Expected dissociation gradually resolved over the 2 hour observation period. She feels no longer depressed.   The pramipexole got her the rest of the way better.  Doesn't want to cut back on any tx bc afraid of relapse. Tolerating meds.  02/18/23 appt noted: Continues meds Adderall XR 20 mg every morning, stopped Wellbutrin XL 150 every morning, clonidine 0.2 mg twice daily, lorazepam 1 mg every 8 hours as needed.  Trazodone 50 mg tablets 1-2 nightly as needed insomnia Has started nortriptyline 75 mg nightly for about 3 weeks and DT level 176, reduced to 50 mg HS early November. Increased pramipexole to 0.5 mg  to TID  off label for treatment resistant depression  Started Auvelity 1 AM & PM mid Feb. Received Spravato 84 mg today as scheduled.  Tolerated it well without nausea or vomiting headache or chest pain or palpitations.  Expected dissociation gradually resolved over the 2 hour observation period. She feels no longer depressed except when ran out of Auvelity this week DT lack of availability.  Much worse  without it..   The pramipexole got her the rest of the way better.  Doesn't want to cut back on any tx bc afraid of relapse. Tolerating meds.  02/25/23 appt: Received Spravato 84 mg today as scheduled.  Tolerated it well without nausea or vomiting headache or chest pain or palpitations.  Expected dissociation gradually resolved over the 2 hour observation period. Continues meds Adderall XR 20 mg every morning, stopped Wellbutrin XL 150 every morning, clonidine 0.2 mg twice daily, lorazepam 1 mg every 8 hours as needed.  Trazodone 50 mg tablets 1-2 nightly as needed insomnia Has started nortriptyline 75 mg nightly for about 3 weeks and DT level 176, reduced to 50 mg HS early November. Increased pramipexole to 0.5 mg  to TID  off label for treatment resistant depression  Started Auvelity 1 AM & PM mid Feb. No SE Still dramatically better with meds and Spravato.  Not 100% over depression. Had GS born last week and able to enjoy it now.  Is a little sleepy with pramipexole but manageable.  No change desired.  Sleep is ok.  03/04/23 appt noted: Received Spravato 84 mg today as scheduled.  Tolerated it well without nausea or vomiting headache or chest pain or palpitations.  Expected dissociation gradually resolved over the 2 hour observation period. Continues meds Adderall XR 20 mg every morning, stopped Wellbutrin XL 150 every morning, clonidine 0.2 mg twice daily, lorazepam 1 mg every 8 hours as needed.  Trazodone 50 mg tablets 1-2 nightly as needed insomnia nortriptyline reduced to 50 mg HS early November. Increased pramipexole to 0.5 mg  to TID  off label for treatment resistant depression  Started Auvelity 1 AM & PM mid Feb. Still generally doing well with meds and Spravato except when got tooth abscess.  Freels more depressed after dental surgery.   No SE with meds.   Sleep is OK.  No med changes desired  03/11/23 appt noted: Received Spravato 84 mg today as scheduled.  Tolerated it well  without nausea or vomiting headache or chest pain or palpitations.  Expected dissociation gradually resolved over the 2 hour observation period. Continues meds Adderall XR 20 mg every morning, stopped Wellbutrin XL 150 every morning, clonidine 0.2 mg twice daily, lorazepam 1 mg every 8 hours as needed.  Trazodone 50 mg tablets 1-2 nightly as needed insomnia nortriptyline reduced to 50 mg HS early November. Increased pramipexole to 0.5 mg  to TID  off label for treatment resistant depression  Started Auvelity 1 AM & PM mid Feb. Some increase depression with infection and needing amoxiciliin this week.  Otherwie still doing well with mood and meds. No med changes desired or indicated. No SE  03/21/2023 appointment noted: Continues meds Adderall XR 20 mg every morning, stopped Wellbutrin XL 150 every morning, clonidine 0.2 mg twice daily, lorazepam 1 mg every 8 hours as needed.  Trazodone 50 mg tablets 1-2 nightly as needed insomnia nortriptyline reduced to 50 mg HS early November. Increased pramipexole to 0.5 mg  to TID  off label for treatment resistant depression  Started Auvelity 1 AM & PM mid Feb. Received Spravato 84 mg today as scheduled.  Tolerated it well without nausea or vomiting headache or chest pain or  palpitations.  Expected dissociation gradually resolved over the 2 hour observation period. Depression is much better.  Pretty much resolved this week.  She feels the Auvelity and pramipexole have made the biggest difference.  Obviously the Spravato is also helping significantly.  She wants to continue all of these medications.  She still has anxiety easily.  Especially facing something that she would rather avoid.  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 Tolerating meds without significant side effects. Plan no med changes  03/29/23 appt noted: Continues meds Adderall XR 20 mg every morning, stopped  Wellbutrin XL 150 every morning, clonidine 0.2 mg twice daily, lorazepam 1 mg every 8 hours as needed.  Trazodone 50 mg tablets 1-2 nightly as needed insomnia, nortriptyline 50 mg HS,  pramipexole 0.5 mg  to TID  off label for treatment resistant depression  Started Auvelity 1 AM & PM mid Feb 2024 No SE Received Spravato 84 mg today as scheduled.  Tolerated it well without nausea or vomiting headache or chest pain or palpitations.  Expected dissociation gradually resolved over the 2 hour observation period. Depression continues to improve to a point of very mild sx.  Still hasn't gotten her confidence fully back.  She does enjoy social interactions and is productive at work. She is tolerating the meds well without side effects.  We discussed the possibility of reducing some of the medication given the marked response which was positive with pramipexole.  04/04/23 appt noted: Continues meds Adderall XR 20 mg every morning, stopped Wellbutrin XL 150 every morning, clonidine 0.2 mg twice daily, lorazepam 1 mg every 8 hours as needed.  Trazodone 50 mg tablets 1-2 nightly as needed insomnia, nortriptyline 50 mg HS,  pramipexole 0.5 mg  to TID  off label for treatment resistant depression  Started Auvelity 1 AM & PM mid Feb 2024 No SE Received Spravato 84 mg today as scheduled.  Tolerated it well without nausea or vomiting headache or chest pain or palpitations.  Expected dissociation gradually resolved over the 2 hour observation period. Dep is still much improved with increase in pramipexole.  Largely resolved.  Still gets anxious but takes less lorazepam prn but still needs it.  Sleep ok.  Tolerating meds.   ECT-MADRS    Flowsheet Row Clinical Support from 01/08/2023 in [facility] Clinical Support from 08/06/2022 in [facility] Clinical Support from 07/04/2022 in [facility] Clinical Support from 05/21/2022 in [facility] Office Visit from 03/02/2022 in [facility]  MADRS Total Score 21 29 15 27  46        Past Psychiatric Medication Trials: fluoxetine, duloxetine, Viibryd, Pristiq, sertraline, citalopram,  Trintellix anxious and SI Wellbutrin XL 450 Auvelity 1 dose nortriptyline 75 mg nightly for about 3 weeks and DT level 176, reduced to 50 mg HS NR. Pramipexole 1 mg NR Adderall, Adderall XR, Vyvanse, Ritalin, Strattera low dose NR Lorazepam Trazodone  Depakote,  lamotrigine cog complaints Lithium remotely Abilify 7.5  Vraylar 1.5 mg daily agitation and insomnia Rexulti insomnia Latuda 40 one dose, CO anxious and SI Seroquel XR 300 Olanzapine 10  At visit November 12, 2019. We discussed Patient developed an increasingly severe alcohol dependence problem since her last visit in January.  She went to [facility] and has had no alcohol since then except 1 day.  She  never abused stimulants but they took her off the stimulants at [facility].  Her ADD was markedly worse.  The Wellbutrin did not help the ADD.   D history lamotrigine rash at 67 yo  Review of Systems:  Review of Systems  Constitutional:  Negative for fatigue.  Musculoskeletal:  Positive for back pain. Negative for arthralgias and joint swelling.       SP hip surgery October 2020  Neurological:  Negative for dizziness, tremors and weakness.  Psychiatric/Behavioral:  Positive for decreased concentration and dysphoric mood. Negative for agitation, behavioral problems, confusion, hallucinations, self-injury, sleep disturbance and suicidal ideas. The patient is nervous/anxious. The patient is not hyperactive.        Forgetful at times about med recommendations.  Send present for him The only [place] she notices a it states it is unbelievable him how how much are her 3 steps around freedom of the present fall and just in the past decade  Medications: I have reviewed  the patient's current medications.  Current Outpatient Medications  Medication Sig Dispense Refill   amLODipine (NORVASC) 2.5 MG tablet Take 2.5 mg by mouth daily.     amphetamine-dextroamphetamine (ADDERALL XR) 20 MG 24 hr capsule Take 1 capsule (20 mg total) by mouth every morning. 30 capsule 0   amphetamine-dextroamphetamine (ADDERALL XR) 20 MG 24 hr capsule Take 1 capsule (20 mg total) by mouth every morning. 30 capsule 0   cloNIDine (CATAPRES) 0.2 MG tablet TAKE 1 TABLET BY MOUTH 2 TIMES DAILY. 180 tablet 0   Dextromethorphan-buPROPion ER (AUVELITY) 45-105 MG TBCR Take 1 tablet by mouth 2 (two) times daily. 60 tablet 3   iron polysaccharides (NIFEREX) 150 MG capsule TAKE 1 CAPSULE BY MOUTH EVERY DAY 90 capsule 1   LORazepam (ATIVAN) 1 MG tablet Take 1 tablet (1 mg total) by mouth every 8 (eight) hours as needed. for anxiety 90 tablet 1   losartan (COZAAR) 50 MG tablet Take 50 mg by mouth daily.     nebivolol (BYSTOLIC) 2.5 MG tablet Take 2.5 mg by mouth daily.     nortriptyline (PAMELOR) 25 MG capsule Take 2 capsules (50 mg total) by mouth at bedtime. 180 capsule 0   pramipexole (MIRAPEX) 0.5 MG tablet Take 1 tablet (0.5 mg total) by mouth 3 (three) times daily. 90 tablet 1   traZODone (DESYREL) 50 MG tablet TAKE 1-2 TABLETS BY MOUTH NIGHTLY AS NEEDED FOR SLEEP 180 tablet 1   buPROPion (WELLBUTRIN XL) 150 MG 24 hr tablet TAKE 3 TABLETS BY MOUTH DAILY (Patient not taking: Reported on 04/04/2023) 270 tablet 1   Esketamine HCl, 84 MG Dose, (SPRAVATO, 84 MG DOSE,) 28 MG/DEVICE SOPK USE 3 SPRAYS IN EACH NOSTRIL ONCE A WEEK 3 each 1   No current facility-administered medications for this visit.    Medication Side Effects: None  Allergies:  Allergies  Allergen Reactions   Metronidazole Shortness Of Breath and Other (See Comments)    Heart pounding   Ferrlecit [Na Ferric Gluc Cplx In Sucrose] Other (See Comments)    Infusion reaction 05/12/2019    Past Medical History:  Diagnosis Date    ADHD    Anemia    Anxiety    Arthritis    Depression    Heart murmur    i went to see a cardiologit slast eyar  and i had zero plaque,    PONV (postoperative nausea and vomiting)    Recovering alcoholic in remission [facility])     Family History  Problem  Relation Age of Onset   Atrial fibrillation Mother    CAD Father     Past Medical History, Surgical history, Social history, and Family history were reviewed and updated as appropriate.   Please see review of systems for further details on the patient's review from today.   Objective:   Physical Exam:  There were no vitals taken for this visit.  Physical Exam Constitutional:      General: She is not in acute distress. Neurological:     Mental Status: She is alert and oriented to person, place, and time.     Coordination: Coordination normal.     Gait: Gait normal.  Psychiatric:        Attention and Perception: Attention and perception normal.        Mood and Affect: Mood is anxious. Mood is not depressed.        Speech: Speech is not rapid and pressured.        Behavior: Behavior is not slowed.        Thought Content: Thought content is not paranoid or delusional. Thought content does not include homicidal or suicidal ideation. Thought content does not include suicidal plan.        Cognition and Memory: Cognition normal. Memory is not impaired.        Judgment: Judgment normal.     Comments: Insight intact. No auditory or visual hallucinations. No delusions.  Depression nearly resolved with pramipexole TID and relapsed when out of Auvelity. No manic sx     Lab Review:     Component Value Date/Time   NA 137 01/12/2021 1430   NA 140 11/18/2018 1544   K 3.8 01/12/2021 1430   CL 108 01/12/2021 1430   CO2 22 01/12/2021 1430   GLUCOSE 94 01/12/2021 1430   BUN 14 01/12/2021 1430   BUN 20 11/18/2018 1544   CREATININE 0.82 01/12/2021 1430   CALCIUM 8.9 01/12/2021 1430   PROT 6.6 01/12/2021 1430   ALBUMIN 3.9 01/12/2021  1430   AST 12 (L) 01/12/2021 1430   ALT 11 01/12/2021 1430   ALKPHOS 46 01/12/2021 1430   BILITOT 0.5 01/12/2021 1430   GFRNONAA >60 01/12/2021 1430   GFRAA >60 09/02/2019 0249   GFRAA >60 01/27/2019 0811       Component Value Date/Time   WBC 4.5 01/12/2021 1430   RBC 4.32 01/12/2021 1430   HGB 12.8 01/12/2021 1430   HGB 12.9 07/17/2019 0953   HCT 38.5 01/12/2021 1430   HCT 21.9 (L) 12/25/2018 1221   PLT 272 01/12/2021 1430   PLT 286 07/17/2019 0953   MCV 89.1 01/12/2021 1430   MCH 29.6 01/12/2021 1430   MCHC 33.2 01/12/2021 1430   RDW 12.4 01/12/2021 1430   LYMPHSABS 1.4 01/12/2021 1430   MONOABS 0.4 01/12/2021 1430   EOSABS 0.0 01/12/2021 1430   BASOSABS 0.0 01/12/2021 1430    No results found for: "POCLITH", "LITHIUM"   No results found for: "PHENYTOIN", "PHENOBARB", "VALPROATE", "CBMZ"   .res Assessment: Plan:    Recurrent major depression resistant to treatment (HCC) - Plan: pramipexole (MIRAPEX) 0.5 MG tablet  Generalized anxiety disorder  Attention deficit hyperactivity disorder (ADHD), predominantly inattentive type  Insomnia due to mental condition  Accelerated hypertension   She has treatment resistant major depression ongoing with 50% better with Spravato. Have  discussed some of her  abnormal behaviors last year leading to this depressive episode getting worse which she says were associated with heavy use of delta  8 and not a manic episode.  She realizes now that that was not good for her.  She stopped all use of other drugs including those available over-the-counter such as delta 8 or any other THC related products.  She is no longer having any of those types of behaviors and instead is depressed.  She is experiencing more pleasure and is less blunted and enjoying more and better inteest versus before the Spravato.  She also feels worse if misses a dose of Spravato.  Depression nearly resolved with pramipexole TID and relapsed when out of  Auvelity.  Depression resolved recently after increasing pramipexole 0.5 mg TID, brief relapse when out of Auvelity Consider switch to ER to reduce sleepiness  Patient was administered Spravato 84 mg intranasally today.  The patient experienced the typical dissociation which gradually resolved over the 2-hour period of observation.  There were no complications.  Specifically the patient did not have nausea or vomiting or headache.  Blood pressures monitored at the 40-minute and 2-hour follow-up intervals.  Borderline high.  By the time the 2-hour observation period was met the patient was alert and oriented and able to exit without assistance.  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  See nursing note for further details.She wants to continue Spravato. We discussed discussed the side effects in detail as well as the protocol required to receive Spravato.   Failed multiple antidepressants.  Many of them were not actual failures but intolerances and it is unclear whether some of that was more connected with anxiety than true side effects.  1 example is the [place].  In general she does not want to try anything but an antidepressant but has failed all major categories of antidepressants except TCAs and MAO inhibitors which have not been tried until now.  Started nortriptyline 75 mg nightly.  Serum level 176.  So Reduced to 50 mg HS. Consider stopping it.  Discussed side effects in detail.  Needs more time to help. We discussed the possibility of trying to wean off nortriptyline in the hopes that she can maintain an adequate response with the Auvelity plus pramipexole and Spravato.  We will discuss it again and she prefers not to make the change yet.  She is tolerating Auvelity.  Continue 1  BID bc more depressed without it.  Worse with less pramipexole. Increased to 0.5 mg TID on about end of Feb 2024 and  markedly better Dosing range off label for depression ranges from 1-5 mg daily.  Disc risk compulsive impulsive behavior and mania.    Started Spravato 84 mg twice weekly on 03/16/2022.  Now on weekly administration  Adderall  XR 20 mg AM   Ativan 1 mg 3 times daily as needed anxiety but try to cut it back.  She still feels she needs it. Is not ideal to use benzodiazepine with stimulant but because of the severity of her symptoms it has been necessary.  Hope to eventually eliminate the benzodiazepine.  Expected as her depression improves her anxiety will improve as well.   We will expect that to improve as the depression improves.  She has headed insturctions to reduce this.  Continue clonidine 0.2 mg BID off label for anxiety and helps BP partially. BP is better controlled but not consistent.  Consider increasing amlodipine.  Also on losartan 50  Discussed potential benefits, risks, and side effects of stimulants with patient to include increased heart  rate, palpitations, insomnia, increased anxiety, increased irritability, or decreased appetite.  Instructed patient to contact office if experiencing any significant tolerability issues. She wants to return to usual dose of Adderall for ADD bc of mor poor cognitive function with reduction.  Also discussed that depression will impair cognitive function.  Disc risk polypharmacy.  Reevaluate meds after better with pramipexole.  Relapsed after running out of Auvelity so needs clearly it and pramipexole.  Give it more time.  But consider trying to wean nortriptyline in hopes she can do as well with less medication.  Rec keep track of BP and discuss with PCP. It has been up and down.  Sometimes needs extra clonidine before Spravato  Has Maintained sobriety  No med changes indicated today: Continue Adderall XR 20 mg every morning Continue Wellbutrin XL 150 every morning Continue Auvelity 1 tablet twice daily,  continue clonidine 0.2 mg twice daily for  anxiety and blood pressure. Continue lorazepam 1 mg 3 times daily she has been unable to cut back on the dose due to anxiety. Continue nortriptyline 50 mg nightly Continue pramipexole 0.5 mg 3 times daily Continue trazodone 50 mg tablets 1-2 nightly as needed for sleep. Consider weaning nortriptyline given insufficient benefot with it.  FU with Spravato weekly   Meredith Staggers, MD, DFAPA     Please see After Visit Summary for patient specific instructions.  No future appointments.                   No orders of the defined types were placed in this encounter.      -------------------------------

## 2023-04-04 NOTE — Progress Notes (Signed)
NURSES NOTE:   Patient arrived for her weekly 84mg  Spravato treatment. Pt is being treated for Treatment Resistant Depression this is #61 treatment, pt will be receiving 84 mg which will continue to be her maintenance dose, she receives weekly treatments.  Patient arrived and taken to treatment room. Confirmed she had a ride home which is her husband would be coming back to pick up pt when done and sometimes she needs to use [facility]oad if he is unable to pick her up. Pt's Spravato is ordered through [employer] and delivered to office, all Spravato medication is stored at doctors office per REMS/FDA guidelines. The medication is required to be locked behind two doors per FDA/REMS Protocol. Medication is also disposed of properly per regulations. Pt's prior authorization for Spravato 84 mg was renewed with an approval through 07/17/2023 with [facility]. All treatments with vital signs documented with Spravato REMS per protocol of being a treatment center.    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 Began taking patient's vital signs at 9:45 AM 122/81, pulse 83. Pulse Ox 99%. Gave patient first dose 28 mg nasal spray, each nasal spray administered in each nostril as directed and waited 5 minutes between the second and third dose. All 3 doses given pt did not complain of any nausea/vomiting, given a cup of water due to the taste after the administration of Spravato.  She listens to [employer] with spa or relaxing music. 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 She reports her PCP increasing one of her medications to help her B/P. , checked 40 minute vitals at 10:30 AM, 105/64, pulse 71, Pulse Ox 92%. Explained she would be monitored for a total time of 120 minutes. Discharge vitals were taken at 11:40 AM 125/84 P 63, 99% Pulse Ox. Dr. Jennelle Human met with pt today and  discussed her medication. I walked pt to elevator, where her husband met her for the ride home. Recommend she go home and sleep or just relax on the couch. No driving, no intense activities. Verbalized understanding. Nurse was with pt a total of 70 minutes for clinical. Pt will be scheduled next Wednesday, May 29 th. Pt instructed to call office with any problems or questions.      LOT [id number] [id number]

## 2023-04-09 ENCOUNTER — Other Ambulatory Visit: Payer: Self-pay | Admitting: Psychiatry

## 2023-04-10 ENCOUNTER — Encounter: Payer: Self-pay | Admitting: Psychiatry

## 2023-04-10 ENCOUNTER — Ambulatory Visit: Payer: 59

## 2023-04-10 ENCOUNTER — Ambulatory Visit (INDEPENDENT_AMBULATORY_CARE_PROVIDER_SITE_OTHER): Payer: 59 | Admitting: Psychiatry

## 2023-04-10 VITALS — BP 117/70 | HR 62

## 2023-04-10 DIAGNOSIS — F411 Generalized anxiety disorder: Secondary | ICD-10-CM

## 2023-04-10 DIAGNOSIS — F900 Attention-deficit hyperactivity disorder, predominantly inattentive type: Secondary | ICD-10-CM

## 2023-04-10 DIAGNOSIS — F339 Major depressive disorder, recurrent, unspecified: Secondary | ICD-10-CM | POA: Diagnosis not present

## 2023-04-10 DIAGNOSIS — F5105 Insomnia due to other mental disorder: Secondary | ICD-10-CM | POA: Diagnosis not present

## 2023-04-10 MED ORDER — LORAZEPAM 1 MG PO TABS: 1.0000 mg | ORAL_TABLET | Freq: Three times a day (TID) | ORAL | 1 refills | Status: DC | PRN

## 2023-04-10 NOTE — Progress Notes (Signed)
Laura Chang [id number] [birth year] 67 y.o.  Subjective:   Patient ID:  Laura Chang is a 67 y.o. (DOB [birth year]) female.  Chief Complaint:  Chief Complaint  Patient presents with   Follow-up   Depression   Anxiety      Laura Chang presents to the office today for follow-up of depression and anxiety and ADD.  seen November 12, 2019.  Melted down in 2020.  Went to [facility] [employer] in July.  No withdrawal.  1 drink since then.  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 She was on no stimulant and no SSRI but was taking Strattera and Wellbutrin.  The following changes were made. Stop Strattera. OK restart stimulant bc severe ADD Restart Adderall 1 daily for a few days and if tolerated then restart 1 twice daily. If not tolerated reduce the dosage if needed. May need to stop Wellbutrin if not tolerating the stimulant.  Yes.  DC Wellbutrin Restart Prozac 20 mg daily.  February 2021 appointment with the following noted: Completed grant proposal.  Couldn't doit without Adderall.  Sold a bunch of work.   Adderall XR lasts about 3 pm.  Strength seems about right.  BP been OK.  Not jittery.   Stopped Wellbutrin but had no SE. Mood drastically better with grant proposal and back on fluoxetine.  Less depressed and lethargic.  No anxiety.  Cut back on coffee. Started back with devotions and stronger faith. Plan: Continue Prozac 20 mg daily. May have to increase the dose at some point in the future given that she usually was taking higher dosages but she is getting good response at this time. Restart Wellbutrin off label for ADD since can't get 2 ADDERALL daily. 150 mg daily then 300 mg daily. She can adjust the dose between 150 mg and 300 mg daily to get the optimal effect.   05/11/2020 appointment with the following noted: Has been inconsistent with Prozac and Wellbutrin. Not sure of the effect of Wellbutrin. Biggest deterrent in work is anxiety.  Some of  the work is conceptual and difficult at times.  Can feel she's not up to a project at times.  Overall is OK but would like a steadier benefit from stimulant.  Exhausted from managing concentration and keeping up with things from the day.  Loses things.  Not good keeping up with schedule. Overall productive and emotionally OK. Can feel Adderall wear off. Mood is better in summer and worse in the winter.   F died in Sep 17, 2023 and that is a loss. No SE Wellbutrin. Still attends AA meetings.  Real benefit from [facility] last year. Recognizes effect of anemia on ADD and mood.  Had iron infusions last winter. Plan:  Wellbutrin off label for ADD since can't get 2 ADDERALL daily. 150 mg daily then 300 mg daily.  01/24/2021 appointment with following noted: Doing a program called [employer] mindfulness app since Xmas.  CBT app helped the depression.  App helped her focus better.  Lost sign weight. Writing a lot. Before Xmas felt depressed and started negative thinking worse, self denigrating. Not drinking. More isolated.   Recognizes mo is narcissist.    Didn't tell anyone she was born until 3 mos later.  M aloof and uninterested in pt.  Lied about her birthday.  Mo lack of affection even with pt's kids. Going to AA for a year and it helped her to quit drinking. Also misses kids being gone with a hole  also. Plan: No med changes  05/04/2021 appointment with the following noted: Therapist Laura Chang thinks she's manic. Lost weight to 144#.   States she is still sleeping okay.  Admits she is hyper and recognizes that she is likely manic.  She feels great, euphoric with an increased sense of spiritual connectedness to God.  She has racing thoughts and talks fast and talks a lot and this is noted by her husband.  He thinks she is a bit hyper.  She has been able to maintain sobriety although she will have 1 glass of wine on special occasions but does not drink by herself.  She is not drinking to excess.   She denies any dangerous impulsivity.  She is clearly not depressed and not particularly anxious.  She has no concerns about her medication and she has been compliant.  06/16/21 appt noted: So much better.  Going through a lot but the manic thing happened on top of it.  So much slower.  Didn't feel like losing anything with risperidone.  Likes the Adderalll at 10 mg. Some drowsiness in the AM and very drowsy from risperidone 2 mg HS. Prayer life is better. Handling stress better. Less depressed with risperidone. Still likes trazodone. Sleeps well. Plan: Reduce Prozac to 10 mg daily.  Consider stopping it because it can feel the mania however she is reluctant to do that because she fears relapse of depression. Reduce risperidone to 1.5 mg nightly due to side effects.  Discussed risk of worsening mania.  07/25/2021 appointment with the following noted: Misses the Adderall and hard to function without it. Depressed now. Heavy chest.  Anxious and guilty.  Body feels heavy.   Hates Wellbutrin.   Plan: Increase fluoxetine to 20 mg daily Add Abilify 1/2 of 15 mg tablet daily Wean wellbutrin by 1 tablet each week  bc she feels it is not helpful and DT polypharmacy Reduce risperidone to 1 daily for 1 week and stop it. Disc risk of mania. Increase Adderall to XR 20 mg AM  08/08/21 Much less depressed and starting to feel normal I feel a lot better. No SE.  Speech normal off risperidone. Sleeping OK on trazaodone and enough.   Noticed benefit from Adderall again. Plan: continue fluoxetine to 20 mg daily Continue Abilify 1/2 of 15 mg tablet daily for depression and mania continue Adderall to XR 20 mg AM  10/10/2021 phone call: Pt stated she feels like the Abilify should be decreased to 5mg .She said she is depressed but rational and not suicidal.She has an appt Monday and can wait until then if you prefer. MD response: Reduce the Abilify to 7.5 mg every other day.  We will meet on 10/16/2021 and decide  what to do from there.  10/16/2021 appointment with the following noted: More depressed.  Most depressed I've ever been.  Just numb.  Sense of grief.   Thinks the manic episode was unlike anything else she ever had.  Doesn't want to medicate against it.  Don't enjoy people.  Easily overwhelmed.  Had some death thoughts but not suicidal.  Has been functional.  Feels better today after reducing Abilify to every other day but she is only been doing that for 3 days. A/P: Episode of post manic depression was explained. continue fluoxetine to 20 mg daily Hold Abilify for 1 week then resume Abilify 1/2 of 15 mg tablet every other day for depression and mania continue Adderall to XR 20 mg AM  10/27/2021 appointment with the  following noted: I'm doing so much better.  Handling the depression better. Better self talk and spiritual focus has helped.   Dep 6/10 manifesting as anxiety with low confidence.   F died 2  years ago and M 67 yo and is dependent . She is working hard to feel better but still feels depressed.  She almost feels like she has a little more anxiety since restarting Abilify every other day. Plan: continue fluoxetine to 20 mg daily DC Abilify .  Vrayalar 1.5 mg QOD to try to get rid of depression ASAP. continue Adderall to XR 20 mg AM  11/10/2021 appointment with the following noted: Busy with Xmas and it was fun with family but then a big let down.  Did well with it.  Functioned well with it.  Working hard on things with depression.  Not shutting down. Not sure but feels better today but yesterday was hard.  Difficulty dealing with mother.  She won't do anything to help herself.  Yesterday with her all day.  Won't do PT and has isolated herself.    Lack of confidence.   No SE with Vraylar.  11/24/21 urgent appointment appt noted: More and more depressed.   So anxious and doesn't want to be alone but can do so. No appetite. Hurts inside. Has had some fleeting suicidal thoughts but  would not act on them.  Tolerating meds. Has been consistent with Vraylar 1.5 mg every other day, fluoxetine 20 mg daily Plan: Increase Vraylar to 1.5 mg daily Change Prozac to Trintellix 10 mg daily. Discussed side effects of each continue Adderall to XR 20 mg AM  12/27/2021 appointment with the following noted: Not OK.  I feel less depressed but feels bat shit. Not sleeping well.  Extremely anxious. Off and on sleep. 3-4 hours of sleep.   Still having daily SI.  But also become obvious has so much to do.  Overwhelmed by tasks.   Needs anxiety meds to just function. Not more motivated.  Walked yesterday.   Feels afraid like in trouble but not irritable or angry. DC DT agitation Vraylar to 1.5 mg daily Change Prozac to Trintellix 10 mg daily. Hold Adderall to XR 20 mg AM Clonidine 0.1 1/2 tablet twice daily for 2 days and if needed for anxiety and sleep increase to 1 twice daily Ok temporary Ativan 1 mg 3 times daily as needed anxiety  01/05/22 appt noted: Off fluoxetine and  Trintellix.  Only on Ativan, trazodone and Adderall XR 20 plus added clonidine 0.1 mg BID Didn't think she needed to start Trintellix. Not taking Ativan.   Didn't like herself last week. Feels some better today. Wonders if the manic sx Not agitated.  Anxiety kind of calmed down.  A lot to be anxious about situationally.  $ stress. Concerns about downers with meds. Can't access normal personality. ? Lethargy and inability to talk as sE. Plan: Latuda 20-40 mg daily with food. Adderall to XR 20 mg AM Clonidine 0.1 1/2 tablet twice daily  reduce dose to be sure no SE Ok temporary Ativan 1 mg 3 times daily as needed anxiety  01/19/22 appt noted: Taking Latuda 20 mg daily.  Took 40 mg once and felt anxious and  SI Still depressed and not very reactive Anxiety mainly about the depression and fears of the future. She wants to revisit manic sx and thinks it was maybe bc taking delta 8 bc was taking a lot of it so  still doesn't think she's  classic bipolar. She wants to only take Prozac bc thinks Latuda is perpetuating depression. Says the delta 8 was very psychaedelic.  When not taking it was not manic.  Sleeping ok again.  Plan: Per her request DC Latuda 20-40 mg daily with food. She wants to continue Prozac alone AMA  Adderall to XR 20 mg AM Clonidine 0.1 1/2 tablet twice daily  reduce dose to be sure no SE Ok temporary Ativan 1 mg 3 times daily as needed anxiety  01/23/2022 phone call complaining of increased anxiety since stopping Latuda.  She will try increasing clonidine.  01/26/2022 phone call not feeling well and wanted to restart the Vraylar.  However notes indicate that had made her agitated therefore she was encouraged to pick up samples of Rexulti 1 mg and start that instead.  02/06/2022 phone call: Stating she felt the Rexulti was helping with depression but she was not sleeping well and obsessing over things.  She was encouraged to increase Rexulti to 2 mg daily and increase trazodone for sleep.  02/09/2022 appointment with the following noted: This was an urgent work in appointment No sleep last night with trazodone 100 mg HS Nothing really better depression or anxiety. Ruminating negative anxious thoughts. Did not tolerate Rexulti because it was causing insomnia.  Does not think it helped depression.  Lacks emotion that she should have.  Lacks her usual personality.  Some hopeless thoughts.  Some death thoughts.  Some suicidal thoughts without plan or intent Plan: DC Rexulti and Prozac & DC trazodone Adderall to XR 20 mg AM Clonidine 0.1 1/tablet twice daily  reduce dose to be sure no SE Ok temporary Ativan 1 mg 3 times daily as needed anxiety Start Seroquel XR 150 mg nightly  03/02/2022 appointment: Laura Chang called back a few days after starting Seroquel stating it was making her more anxious and more depressed.  This seemed unlikely as this medicine rarely ever causes anxiety.  She  stopped the medication waited 3 days and called back still had anxiety and depression but thought perhaps the anxiety was a little better.  She did not want to take the Seroquel. She knew about the option of Spravato and wanted to pursue that. Now questions whether to return to Seroquel while waiting to start Spravato bc feels just as bad without it and knows she didn't give it enough time to work.   MADRS 46  ECT-MADRS    Flowsheet Row Clinical Support from 01/08/2023 in [facility] Clinical Support from 08/06/2022 in [facility] Clinical Support from 07/04/2022 in [facility] Clinical Support from 05/21/2022 in [facility] Office Visit from 03/02/2022 in [facility]  MADRS Total Score 21 29 15 27  46      03/14/22 appt noted: Pt received Spravato 56 mg first dose today with some dissociative sx which were not severe.  She was anxious prior to the administration and felt better after receiving lorazepam 1 mg.  No NV, or HA. Wants to continue Spravato. Ongoing depression and desperate to feel better.  I'm not myself DT deprsssion which is most severe in recent history.  Anhedonia.  Low motivation.  Social avoidance. Continues to think all recent med trials are making her worse.  Sleep ok with Seroquel.  03/16/22 appt noted: Received Spravato 84 mg for the first time.  some dissociative sx which were not severe.  She was anxious prior to the administration and  felt better after receiving lorazepam 1 mg.  No NV, or HA. Wants to continue Spravato.   Does not feel any better or different since the last appt.  Ongoing depression.  Ongoing depression and desperate to feel better.  I'm not myself DT deprsssion which is most severe in recent history.  Anhedonia.  Low motivation.  Social avoidance. Continues to think all recent med trials are making her worse.  Sleep ok  with Seroquel.  Does not want to continue Seroquel for TRD.  03/20/2022 appointment noted: Came for Spravato administration today.  However blood pressure was significantly elevated approximately 180/115.  She was given lorazepam 1 mg and clonidine 0.2 mg to try to get it down. She states she regretted stopping the Seroquel XR 300 mg tablets.  She now realizes it was helpful.  She did not sleep much at all last night.  She did not take the Adderall this morning. 2 to 3 hours after arrival blood pressure was still elevated at  170/110, 62 pulse.  For Spravato administration was canceled for today.  She admits to being anxious and depressed.  She is not suicidal.  She is highly motivated to receive the Spravato.  We discussed getting it tomorrow.  03/22/2022 appointment noted: Patient's blood pressure was never stable enough yesterday in order to get her in for Spravato administration.  She was encouraged to see her primary care doctor.  It is better today.  03/26/2022 appointment with the following noted: Blood pressure was better.  Saw her primary care doctor who started on oral Bystolic 2.5 mg daily. Received Spravato 84 mg today as scheduled.  Tolerated it well without nausea or vomiting headache or chest pain or palpitations.  Her blood pressure was borderline but manageable. She remains depressed and anxious.  She is ambivalent about the medicine and desperate to get to feel better.  Continues to have anhedonia and low energy and low motivation and reduced ability to do things.  Less social.  Not suicidal.  03/28/22 appt noted: Received Spravato 84 mg today as scheduled.  Tolerated it well without nausea or vomiting headache or chest pain or palpitations.  Her blood pressure was borderline but manageable. Has not seen any improvement so far.  Tolerating Seroquel.  Inconsistent with Bystolic and BP has been borderline high. Still depressed and anxious and anhedonia.  Low motivation, energy,  productivity. Taking quetiapine and tolerating XR 300 mg nightly.  04/04/22 appt noted: Received Spravato 84 mg today as scheduled.  Tolerated it well without nausea or vomiting headache or chest pain or palpitations.  Her blood pressure was borderline but manageable. Has not seen any improvement so far.  Tolerating Seroquel.   She still tends to think that the medications are making her worse.  She has said this about each of the recent psychiatric medicines including Seroquel.  However her husband thinks she is improved.  She also admits there is some improvement in productivity.  She still feels highly anxious.  She still does not enjoy things as normal.  She still feels desperate to improve as soon as possible. Has been taking Seroquel XR since 03/20/2022  04/10/22 appt noted: Received Spravato 84 mg today as scheduled.  Tolerated it well without nausea or vomiting headache or chest pain or palpitations.  Her blood pressure was borderline but manageable. Has not seen any improvement so far.  Tolerating Seroquel.  Doesn't like Seroquel bc she thinks it flattens here. Ongoing depression without confidence Plan: Start Auvelity 1 every  morning for persistent treatment resistant depression  04/12/2022 appointment with the following noted: Received Spravato 84 mg today as scheduled.  Tolerated it well without nausea or vomiting headache or chest pain or palpitations.  Her blood pressure was borderline but manageable. Has not seen any improvement so far.  Tolerating Seroquel.  Doesn't like Seroquel bc she thinks it flattens her. Received Spravato 84 mg today as scheduled.  Tolerated it well without nausea or vomiting headache or chest pain or palpitations.  Her blood pressure was borderline but manageable. Has not seen any improvement so far.  Tolerating Seroquel.  Doesn't like Seroquel bc she thinks it flattens here.  We discussed her ambivalence about it. She is starting Auvelity and has tolerated it  the last 2 days without side effect.  She still does not feel like herself and feels flat and not enjoying things with suppressed expressed emotion  04/17/2022 appointment with the following noted: Received Spravato 84 mg today as scheduled.  Tolerated it well without nausea or vomiting headache or chest pain or palpitations.  Her blood pressure was borderline but manageable. Has not seen any improvement so far.  Tolerating Seroquel.  Doesn't like Seroquel bc she thinks it flattens her. She has been tolerating the Auvelity 1 in the morning without side effects for about a week.  She has not noticed significant improvement so far.  She still feels depressed and flat and not herself.  Other people notice that she is flat emotionally.  She is not suicidal.  She does feel discouraged that she is not getting better yet.  04/19/2022 appointment noted: Has increased Auvelity to 1 twice daily for 2 days, continues quetiapine XR 300 mg nightly, clonidine 0.3 mg twice daily, lorazepam 1 mg twice daily for anxiety and Adderall XR 20 mg in the morning. No obious SE but she still thinks quetiapine XR is making her feel down.  But not sedated Received Spravato 84 mg today as scheduled.  Tolerated it well without nausea or vomiting headache or chest pain or palpitations.  Her blood pressure was borderline but manageable. She still feels quite anxious and feels it necessary to take both the clonidine and lorazepam twice a day to manage her anxiety.  She has been consistently down and flat and not herself until yesterday afternoon she noted an improvement in mood and feeling much more like herself with her normal personality reemerging.  She was quite depressed in the morning with very dark negative thoughts.  She did not have those dark negative thoughts this morning.  She had a lot of questions about medication and when she was expecting to be improved and why she has not shown improvement up to now.  04/23/22 appt  noted: Has increased Auvelity to 1 twice daily for 1 week, continues quetiapine XR 300 mg nightly, clonidine 0.3 mg twice daily, lorazepam 1 mg twice daily for anxiety and Adderall XR 20 mg in the morning. No obious SE but she still thinks quetiapine XR is making her feel down.  But not sedated Received Spravato 84 mg today as scheduled.  Tolerated it well without nausea or vomiting headache or chest pain or palpitations.  She is still depressed but admits better function and is able to enjoy social interactions. Tolerating meds.  Would like to feel better for sure. Not herself.  Flat. Plan increase Auvelity to 1 tab BID as planned and reduce Quetiapine to 1/2 of ER 300 mg  bc NR for depression.  04/25/2022 appointment with  the following noted: clonidine 0.3 mg twice daily, lorazepam 1 mg twice daily for anxiety and Adderall XR 20 mg in the morning. Seroquel XR 300 HS No obious SE but she still thinks quetiapine XR is making her feel down.  But not sedated Received Spravato 84 mg today as scheduled.  Tolerated it well without nausea or vomiting headache or chest pain or palpitations.  Called yesterday with more anxiety.  Had increased Auvelity for 1 day and reduced Seroquel XR for 1 day.  Felt restless and fearful  05/01/2022 appointment noted: clonidine 0.3 mg twice daily, lorazepam 1 mg twice daily for anxiety and Adderall XR 20 mg in the morning. Seroquel XR 150 HS, Auvelity 1 BID Received Spravato 84 mg today as scheduled.  Tolerated it well without nausea or vomiting headache or chest pain or palpitations.  Nurse has noted patient has called multiple times sometimes asking the same question repeatedly.  It is unclear whether she is truly forgetful or is just anxious seeking reassurance. Patient acknowledges ongoing depression as well as some anxiety but states she has felt a little better in the last couple of days.  She has reduced the Seroquel to 150 mg at night and has increased Auvelity to 1  twice daily but only for 1 day.  So far she seems to be tolerating it.  05/03/22 appt noted: clonidine 0.2 mg twice daily, lorazepam 1 mg twice daily for anxiety and Adderall XR 20 mg in the morning. Seroquel XR 150 HS, Auvelity 1 BID BP high this am about 170/100 and received extra clonidine 0.2 mg and came to receive Spravato.  Not dizzy, no SOB, nor CP but BP is still high Could not receive Spravato today bc BP high and pulse low at 30 ppm. Still depressed and anxious. Plan: continue trial Auvelity with Spravato She needs to get BP and pulse managed  05/08/22 TC: RTC  Laura Chang NA and mailbox full.  Could not leave message.  Pt  -  talked to she and Laura on speaker. Laura worried over wife.  Vacant stare.  Slurs words at times.  Not smiling. Reduced enjoyment.  Depression.  Withdrawn from usual activities.  Some irritability.  Anxious. Disc her concerns meds are making her worse.  Extensive discussion about her treatment resistant status.  There is a consistent pattern of not taking the medicines long enough to get benefit because she believes the meds are making her worse.  However the symptoms she describes as side effects are exactly the same symptoms that she had prior to taking the medication RX for  the depression.  So it is not clear that these are actual side effects. This is true about the 2 most recent meds including Seroquel and Auvelity.  Recommend psychiatric consultation in hopes of improving her comfort level with taking prescribed medications for a sufficient length of time to provide benefit. Extensive discussion about ECT is the treatment of choice for treatment resistant depression.  Spravato may work if she can comply with consistency.  There are medication options but they take longer to work.   Plan:  Reduce clonidine to 0.1 mg BID DT bradycardia.  Talk with PCP about BP and low pulse problems which are interfering with her consistent compliance with Spravato.   Limit lorazepam to  3 -4  mg daily max. Excess use is the cause of slurring speech.  She must stop excess use or will have to stop the med. Stop Auvelity per her request.  But she has only been on the full dose for a little over a week and clearly has not had time to get benefit from it.  She thinks maybe it is making her more anxious. Reduce Seroquel from 150XR to 50 -100 mg at night IR.  She couldn't sleep when stopped it completely. Will not start new antidepressant until her SE issues are resolved or not. Get second psych opinion from Laura Hampshire MD or another psychiatrist.  Laura's sister is therapist in [place] Rink, MD, [facility]  05/16/2022 appointment with the following noted: Received Spravato 84 mg today as scheduled.  Tolerated it well without nausea or vomiting headache or chest pain or palpitations.  She stopped Auvelity as discussed last week. On her own, without physician input, she restarted Wellbutrin XL 450 mg every morning today.  She had taken it in the past.  She feels jittery and anxious. She feels less depressed than she did last week.  But she is still depressed without her usual range of affect.  She still is less social and less motivated than normal. Her primary care doctor increased the dose of losartan Plan: Stop Seroquel Reduce Wellbutrin XL to 300 mg every morning.  Starting the dose at 450 every morning is likely causing side effects of jitteriness and it should not be started at that have a dosage. Recommend she not change meds on her own without MDM put  05/23/2022 appointment with the following noted: Received Spravato 84 mg today as scheduled.  Tolerated it well without nausea or vomiting headache or chest pain or palpitations.  Has not dropped seroquel XR 300 mg 1/2 tablet nightly bc couldn't sleep without it. Has not tried lower dose quetiapine 50 mg HS Still feels depressed.   BP is better managed so far, just saw PCP.  BP is better today and infact is low today. Dropped  clonidine as directed from 0.3 mg BID bc inadequate control of BP to 0.2 mg BID.  However she wants to increase it back to 0.3 mg twice daily because she feels it helped her anxiety better.  Wonders about increasing Wellbutrin for depression.  However she has only been on 300 mg a day for a week.  She was on 450 mg daily in the past.  06/06/22 appt noted: Received Spravato 84 mg today as scheduled.  Tolerated it well without nausea or vomiting headache or chest pain or palpitations.  She is still depressed and anxious.  She wants to try to stop the Seroquel but cannot sleep without some of it.  She is taking lorazepam 1 mg 4 times daily and still having a lot of anxiety.  She wants to increase clonidine back to 0.3 mg twice daily.  She hopes for more improvement She recently went for a second psychiatric opinion as suggested the results of that are pending.  06/11/22 appt noted: Received Spravato 84 mg today as scheduled.  Tolerated it well without nausea or vomiting headache or chest pain or palpitations.  She is still depressed and anxious. Without much change.  Still hopeless, anhedonia, reduced inteterest and motivation.  Tolerating meds. Disc concerns Spravato is not hleping much. Plan: stop Seroquel and start olanzapine 10 mg HS for TRD and anxiety.  06/13/2022 appointment noted: Received Spravato 84 mg today as scheduled.  Tolerated it well without nausea or vomiting headache or chest pain or palpitations.  She is still depressed and anxious. Without much change.  Still hopeless, anhedonia, reduced inteterest and motivation.  Tolerating meds. Disc concerns Spravato is not helping much as hoped but is improving a bit in the last week. Tolerating meds. Continues Wellbutrin XL 450 AM, tolerating recently started olanzapine  10 mg HS. Sleep is good.   Pending appt with TMS consult.  06/18/22 appt noted: Received Spravato 84 mg today as scheduled.  Tolerated it well without nausea or vomiting  headache or chest pain or palpitations.  Tolerating meds. Continues Wellbutrin XL 450 AM, tolerating recently started olanzapine  10 mg HS. Continues Adderall XR 20 amd and has tried to reduce lorazepam to 1mg  TID Sleep is good.   Pending appt with TMS consult. Depression is a little bit better in the last week with a little improvement in emotional expression and interest.  She is pushing herself to be more active.  Her daughter thought she was a little better than she has been.  However she is still depressed and still not her normal self with anhedonia and reduced emotional expressiveness.  06/20/22 appt noted: Received Spravato 84 mg today as scheduled.  Tolerated it well without nausea or vomiting headache or chest pain or palpitations.  Tolerating meds with a little sleepiness. Continues Wellbutrin XL 450 AM, tolerating recently started olanzapine  10 mg HS. Continues Adderall XR 20 amd and has tried to reduce lorazepam to 1mg  TID Sleep is good.   Mood is improving.  Better funciton.  Anxiety is better with olanzapine. Still not herself and depression not gone with some anhedonia and social avoidance and feeling overwhelmed.  8/14 2023 received Spravato 84 mg 06/27/2022 received Spravato Spravato 84 mg 07/02/2022 received Spravato 84 mg 07/04/2022 received Spravato 84 mg  07/09/2022 appointment noted: Received Spravato 84 mg today as scheduled.  Tolerated it well without nausea or vomiting headache or chest pain or palpitations.  Expected dissociation and feels less depressed with resolution of negative emotions immediately after Spravato and then depression, anxiety creep back in. Continues meds Adderall XR 20 mg every morning, Wellbutrin XL 450 every morning, clonidine 0.1 mg twice daily, lorazepam 1 mg every 6 hours as needed, olanzapine increased from 7.5 to 10 mg nightly on  Tolerating meds.  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  She does note some increased appetite and is somewhat concerned about that but has not gained significant amounts of weight. She has had the TMS consultation which was initially denied but she knows it can be appealed.  However because she is improving with Spravato plus the other medications now she wants to continue the current treatment plan.  07/18/22 appt noted: Continues meds Adderall XR 20 mg every morning, Wellbutrin XL 450 every morning, clonidine 0.1 mg twice daily, lorazepam 1 mg every 6 hours as needed, olanzapine increased from 7.5 to 10 mg nightly on 07/04/2022. Received Spravato 84 mg today as scheduled.  Tolerated it well without nausea or vomiting headache or chest pain or palpitations.  Expected dissociation and feels less depressed with resolution of negative emotions immediately after Spravato and then depression, anxiety creep back in. Continues meds Adderall XR 20 mg every morning, Wellbutrin XL 450 every morning, clonidine 0.1 mg twice daily, lorazepam 1 mg every 6 hours as needed, olanzapine increased from 7.5 to 10 mg nightly on  Tolerating meds.  She notes she is clearly improved with regard to depression and anxiety since the switch from Seroquel to olanzapine 10 mg  nightly for treatment resistant depression.  She does note some increased appetite and is somewhat concerned about that but has not gained significant amounts of weight. She has had the TMS consultation which was initially denied but she knows it can be appealed. She continues to have chronic ambivalence about psychiatric medicines and initially tends to blame her depressive symptoms such as decreased concentration and feeling flat on what ever medicine she currently is taking even though she had the same symptoms before the current medicines were started.  Then after discussion she does admit that her depressive symptoms are improved since adding  olanzapine but still has those residual symptoms noted.  07/23/22 received Spravato 84 mg   07/30/2022 appointment noted: Received Spravato 84 mg today as scheduled.  Tolerated it well without nausea or vomiting headache or chest pain or palpitations.  Expected dissociation and feels less depressed with resolution of negative emotions immediately after Spravato and then depression, anxiety creep back in. Continues meds Adderall XR 20 mg every morning, Wellbutrin XL 450 every morning, clonidine 0.1 mg twice daily, lorazepam 1 mg every 6 hours as needed, olanzapine increased from 7.5 to 10 mg nightly on  She has been inconsistent with olanzapine because she continues to be ambivalent about the medications in general and thinks that perhaps the 10 mg is making her feel blunted.  She continues to feel some depression.  She had a good day this week and but still feels somewhat depressed and persistently anxious. Plan: be consistent with olanzapine 10 mg HS for TRD and longer trial for potential benefit for anxiety.  Has not taken it consistently.  08/06/2022 appointment noted: Received Spravato 84 mg today as scheduled.  Tolerated it well without nausea or vomiting headache or chest pain or palpitations.  Expected dissociation and feels less depressed with resolution of negative emotions immediately after Spravato and then depression, anxiety creep back in. Continues meds Adderall XR 20 mg every morning, Wellbutrin XL 450 every morning, clonidine 0.1 mg twice daily, lorazepam 1 mg every 6 hours as needed, olanzapine i 10 mg nightly  She continues to feel depressed but is about 50% better with Spravato.  She is still not herself.  She still has anhedonia.  She still is not her able to engage socially in the typical ways.  She is not jovial and outgoing like normal.  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  She continues to feel that her personality is dampened down.  There is a question about whether it is related to depression or medication. Plan: continue olanzapine 10 for longer trial for TRD and severe anxiety.  08/13/22 appt noted:  Received Spravato 84 mg today as scheduled.  Tolerated it well without nausea or vomiting headache or chest pain or palpitations.  Expected dissociation and feels less depressed with resolution of negative emotions immediately after Spravato and then depression, anxiety creep back in. Continues meds Adderall XR 20 mg every morning, Wellbutrin XL 450 every morning, clonidine 0.1 mg twice daily, lorazepam 1 mg every 6 hours as needed, olanzapine i 10 mg nightly  She still does not feel herself.  Still struggles with depression and low motivation and reduced social engagement and reduced interest and reduced emotional expression.  She is somewhat better with the medicines plus Spravato.  She still believes the Spravato makes her blunted and is not sure  how much it helps her anxiety.  She can have good days when her family is around and she is engaged.  She still wants to stop the olanzapine. She has apparently continued to take the trazodone despite having been told to stop it when she started olanzapine.  She feels like she needs the trazodone. Plan: DC olanzapine and Start nortriptyline 25 mg nightly and build up to 75 mg nightly and then check blood level.    08/27/2022 appointment noted: Received Spravato 84 mg today as scheduled.  Tolerated it well without nausea or vomiting headache or chest pain or palpitations.  Expected dissociation and feels less depressed with resolution of negative emotions immediately after Spravato and then depression, anxiety creep back in. Continues meds Adderall XR 20 mg every morning, Wellbutrin XL 450 every morning, clonidine 0.1 mg twice daily, lorazepam 1 mg every 6 hours as needed. Stopped olanzapine and started  nortriptyline which she has taken for about a week is 75 mg nightly. So far she is tolerating the nortriptyline well with the exception of some dry mouth and constipation which she is working to manage.  She does not feel substantially better better or different off the olanzapine.  No change in her sleep which is good.  Main concern currently in addition to the residual depression is anxiety which is somewhat situational with pending arch show.  She is worrying about it more than normal.  Says she is having to take lorazepam twice a day where she had been able to keep reduce it prior to this.  She still does not feel like herself with residual depression with less social interest and less of her usual buoyancy in personality.  She is flatter than normal.  Overall she still feels that the Spravato has been helpful at reducing the severity of the depression.  She is not suicidal. She has not heard anything about the TMS appeal as of yet.  09/05/2022 appointment noted: Received Spravato 84 mg today as scheduled.  Tolerated it well without nausea or vomiting headache or chest pain or palpitations.  Expected dissociation and feels less depressed with resolution of negative emotions immediately after Spravato and then depression, anxiety creep back in. Continues meds Adderall XR 20 mg every morning, Wellbutrin XL 450 every morning, clonidine 0.1 mg twice daily, lorazepam 1 mg every 6 hours as needed. Stopped olanzapine and started nortriptyline which she has taken for about 2 week is 75 mg nightly. Initially blood pressure was a little high causing delay in starting Spravato.  She admitted to feeling a little wound up.  She still experiences a little increase in depression if she goes longer than a week in between doses of Spravato.  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  09/10/22 appt noted: Received  Spravato 84 mg today as scheduled.  Tolerated it well without nausea or vomiting headache or chest pain or palpitations.  Expected dissociation gradually resolved over the 2 hour observation period. She feels 50% less depressed with Spravto and wants to continue it.   Continues meds Adderall XR 20 mg every morning, Wellbutrin XL 450 every morning, clonidine 0.1 mg twice daily, lorazepam 1 mg every 6 hours as needed. Has started nortriptyline 75 mg nightly for about 3 weeks. Has not seen a significant difference with the addition of nortriptyline.  Tolerating it pretty well. She continues to have some  degree of anhedonia and significant depression and anxiety.  Her daughters noticed that she is more needy and calls more frequently.  She acknowledges this as well.  She is clearly still not herself. Plan: pramipexole off label and RX 0.25 mg BID  09/17/2022 appointment noted: Received Spravato 84 mg today as scheduled.  Tolerated it well without nausea or vomiting headache or chest pain or palpitations.  Expected dissociation gradually resolved over the 2 hour observation period. She feels 50% less depressed with Spravto and wants to continue it.   Continues meds Adderall XR 20 mg every morning, Wellbutrin XL 450 every morning, clonidine 0.1 mg twice daily, lorazepam 1 mg every 6 hours as needed. Has started nortriptyline 75 mg nightly for about 3 weeks and DT level 176, reduced to 50 mg HS early November. Still the same sx as noted last visit.  Tolerating meds.   Compliant.  Still depressed and family notices.  Has been able to participate in family interactions.  Some post-show let down and has to do detailed work which is hard for her bc ADD.  Sleep and eating well.  Energy OK but not great.  No SI.  Not cried in a year or so.  Clearly less depressed and hopeless than before the Spravato.  09/24/22 appt noted: Received Spravato 84 mg today as scheduled.  Tolerated it well without nausea or vomiting  headache or chest pain or palpitations.  Expected dissociation gradually resolved over the 2 hour observation period. She feels 50% less depressed with Spravto and wants to continue it.   Continues meds Adderall XR 20 mg every morning, Wellbutrin XL 450 every morning, clonidine 0.1 mg twice daily, lorazepam 1 mg every 6 hours as needed. Has started nortriptyline 75 mg nightly for about 3 weeks and DT level 176, reduced to 50 mg HS early November. Still the same sx as noted last visit.  Tolerating meds.   Compliant.  Still depressed and family notices.  Has been able to participate in family interactions.  Some post-show let down and has to do detailed work which is hard for her bc ADD.  Sleep and eating well.  Energy OK but not great.  No SI.  Not cried in a year or so.  Clearly less depressed and hopeless than before the Spravato. Is not making further progress generally.  Stuck with moderate depression  10/02/22 appt noted: Received Spravato 84 mg today as scheduled.  Tolerated it well without nausea or vomiting headache or chest pain or palpitations.  Expected dissociation gradually resolved over the 2 hour observation period. She feels 50% less depressed with Spravto and wants to continue it.   Continues meds Adderall XR 20 mg every morning, Wellbutrin XL 450 every morning, clonidine 0.1 mg twice daily, lorazepam 1 mg every 6 hours as needed. Has started nortriptyline 75 mg nightly for about 3 weeks and DT level 176, reduced to 50 mg HS early November. Still the same sx as noted last visit.  Tolerating meds.   Compliant.  Still depressed and family notices.  Has been able to participate in family interactions.  Some post-show let down and has to do detailed work which is hard for her bc ADD.  Sleep and eating well.  Energy OK but not great.  No SI.  Not cried in a year or so.  Clearly less depressed and hopeless than before the Spravato. Is not making further progress generally.  Stuck with moderate  depression.  Is able to function  pretty normally. Plan: trial pramipexole 0.25 mg BID off label for depression.   10/08/22 appt noted: Received Spravato 84 mg today as scheduled.  Tolerated it well without nausea or vomiting headache or chest pain or palpitations.  Expected dissociation gradually resolved over the 2 hour observation period. She feels 50% less depressed with Spravto and wants to continue it.   Continues meds Adderall XR 20 mg every morning, Wellbutrin XL 450 every morning, clonidine 0.1 mg twice daily, lorazepam 1 mg every 6 hours as needed. Has started nortriptyline 75 mg nightly for about 3 weeks and DT level 176, reduced to 50 mg HS early November. Still the same sx as noted last visit.  Tolerating meds.   Compliant.  Still depressed and family notices.  Has been able to participate in family interactions.  Some post-show let down and has to do detailed work which is hard for her bc ADD.  Sleep and eating well.  Energy OK but not great.  No SI.  Not cried in a year or so.  Clearly less depressed and hopeless than before the Spravato. Is not making further progress generally.  Stuck with moderate depression.  Behaved and felt pretty normally with family over for Thanksgiving. Doesn't see benefit or SE with pramipexole but thinks maybe it makes her worse. Plan:  increase pramipexole augmentation off label to 0.5 mg BID  10/15/2022 appointment noted: Received Spravato 84 mg today as scheduled.  Tolerated it well without nausea or vomiting headache or chest pain or palpitations.  Expected dissociation gradually resolved over the 2 hour observation period. She feels 50% less depressed with Spravto and wants to continue it.   Continues meds Adderall XR 20 mg every morning, Wellbutrin XL 450 every morning, clonidine 0.2 mg twice daily, lorazepam 1 mg every 6 hours as needed.  Trazodone 50 mg tablets 1-2 nightly as needed insomnia Has started nortriptyline 75 mg nightly for about 3 weeks  and DT level 176, reduced to 50 mg HS early November. Recommended increase pramipexole 0.5 mg twice daily off label for treatment resistant depression on 10/08/2022. She feels better motivated more active with pramipexole 0.5 mg twice daily.  She still is depressed but it is better.  We discussed the possibility of going up in the dose but did not change it.  10/22/2022 appointment noted: Received Spravato 84 mg today as scheduled.  Tolerated it well without nausea or vomiting headache or chest pain or palpitations.  Expected dissociation gradually resolved over the 2 hour observation period. She feels 50% less depressed with Spravto and wants to continue it.   Continues meds Adderall XR 20 mg every morning, Wellbutrin XL 450 every morning, clonidine 0.2 mg twice daily, lorazepam 1 mg every 8 hours as needed.  Trazodone 50 mg tablets 1-2 nightly as needed insomnia Has started nortriptyline 75 mg nightly for about 3 weeks and DT level 176, reduced to 50 mg HS early November. pramipexole 0.5 mg twice daily off label for treatment resistant depression on 10/08/2022. She is better motivated than she was.  She is journaling 3 pages a day.  She has started walking and has walked 5 days a week for 50 minutes for the last 2 weeks.  That is significantly helped her mood.  Her mood tends to be better when she interacts with family.  However she still has some periods of depression.  She is tolerating the medications well.  She still feels like her affect and confidence is not back to normal.  Plan:  increase pramipexole augmentation off label to 0.5 mg tID or 0.75 mg BID   10/29/22 appt noted: Received Spravato 84 mg today as scheduled.  Tolerated it well without nausea or vomiting headache or chest pain or palpitations.  Expected dissociation gradually resolved over the 2 hour observation period. She feels 50% less depressed with Spravto and wants to continue it.   Continues meds Adderall XR 20 mg every  morning, Wellbutrin XL 450 every morning, clonidine 0.2 mg twice daily, lorazepam 1 mg every 8 hours as needed.  Trazodone 50 mg tablets 1-2 nightly as needed insomnia Has started nortriptyline 75 mg nightly for about 3 weeks and DT level 176, reduced to 50 mg HS early November. pramipexole increased to 0.75 mg twice daily off label for treatment resistant depression  She has been feeling somewhat better with the increase in pramipexole and is tolerating it well.  She still is easily overwhelmed.  Her affect and mood can improve now when around her family or doing something positive.  She has been able to be more productive. She is tolerating the current medications. She is still considering TMS as an alternative to Spravato.  11/15/2022 appointment noted: Received Spravato 84 mg today as scheduled.  Tolerated it well without nausea or vomiting headache or chest pain or palpitations.  Expected dissociation gradually resolved over the 2 hour observation period. She feels 50% less depressed with Spravto and wants to continue it.   Continues meds Adderall XR 20 mg every morning, Wellbutrin XL 450 every morning, clonidine 0.2 mg twice daily, lorazepam 1 mg every 8 hours as needed.  Trazodone 50 mg tablets 1-2 nightly as needed insomnia Has started nortriptyline 75 mg nightly for about 3 weeks and DT level 176, reduced to 50 mg HS early November. pramipexole increased to 0.75 mg twice daily off label for treatment resistant depression  He has noticed some increase in depression due to the length of time since the last Spravato administration.  She believes Spravato is helping her.  sHe is not suicidal but has felt very blue the last few days. She is tolerating the medication. She is ambivalent about Spravato versus TMS but she is considering TMS. She wants to continue Spravato administration because it is clearly helpful.  11/19/22 appt noted: Received Spravato 84 mg today as scheduled.  Tolerated it well  without nausea or vomiting headache or chest pain or palpitations.  Expected dissociation gradually resolved over the 2 hour observation period. She feels 50% less depressed with Spravto and wants to continue it.   Continues meds Adderall XR 20 mg every morning, Wellbutrin XL 450 every morning, clonidine 0.2 mg twice daily, lorazepam 1 mg every 8 hours as needed.  Trazodone 50 mg tablets 1-2 nightly as needed insomnia Has started nortriptyline 75 mg nightly for about 3 weeks and DT level 176, reduced to 50 mg HS early November. pramipexole increased to 0.75 mg twice daily off label for treatment resistant depression  She has felt better back on Spravato more regularly.  However she still has residual depression esp when alone or when wihtout activity.  Can function when needed.   Does not have her connfidence back. She plans to pursue TMS availability. Have discussed retrying Auvelity  11/28/22 appt noted: Received Spravato 84 mg today as scheduled.  Tolerated it well without nausea or vomiting headache or chest pain or palpitations.  Expected dissociation gradually resolved over the 2 hour observation period. She feels 50% less depressed with Spravto  and wants to continue it.   Continues meds Adderall XR 20 mg every morning, Wellbutrin XL 450 every morning, clonidine 0.2 mg twice daily, lorazepam 1 mg every 8 hours as needed.  Trazodone 50 mg tablets 1-2 nightly as needed insomnia Has started nortriptyline 75 mg nightly for about 3 weeks and DT level 176, reduced to 50 mg HS early November. pramipexole increased to 0.75 mg twice daily off label for treatment resistant depression  Last week she was more depressed than usual for reasons that are not clear.  She has not had a clear answer from Filomena Jungling about [employer] options.  States that they have not returned her call.  She is asked about Auvelity retry in place of Wellbutrin.  Has still been walking.  12/03/22 appt noted: Received Spravato 84 mg today  as scheduled.  Tolerated it well without nausea or vomiting headache or chest pain or palpitations.  Expected dissociation gradually resolved over the 2 hour observation period. She feels 50% less depressed with Spravto and wants to continue it.   Continues meds Adderall XR 20 mg every morning, Wellbutrin XL 450 every morning, clonidine 0.2 mg twice daily, lorazepam 1 mg every 8 hours as needed.  Trazodone 50 mg tablets 1-2 nightly as needed insomnia started nortriptyline 75 mg nightly for about 3 weeks and DT level 176, reduced to 50 mg HS early November. pramipexole increased to 0.75 mg twice daily off label for treatment resistant depression  No SE .  Satisfied with meds. Depression is better in the last week.  Not sure why that is the case.  Still walking daily and that helps and journaling 3 pages daily.  Working on Biomedical scientist.  No changes desire.  Clearly benefits from Spravato but not 100%.  Still considering TMS.  12/10/22 appt noted: Received Spravato 84 mg today as scheduled.  Tolerated it well without nausea or vomiting headache or chest pain or palpitations.  Expected dissociation gradually resolved over the 2 hour observation period. She feels 50% less depressed with Spravto and wants to continue it.   Continues meds Adderall XR 20 mg every morning, Wellbutrin XL 450 every morning, clonidine 0.2 mg twice daily, lorazepam 1 mg every 8 hours as needed.  Trazodone 50 mg tablets 1-2 nightly as needed insomnia started nortriptyline 75 mg nightly for about 3 weeks and DT level 176, reduced to 50 mg HS early November. pramipexole increased to 0.75 mg twice daily off label for treatment resistant depression  No SE .   Depression was worse this week for no apparent reason.  She struggled being positive.  She has felt more discouraged.  She has felt more anxious.  She has had a hard time doing tasks.  She is interested in retrying the [place] which we had discussed previously. Plan: She agrees to  [employer].  To improve tolerability and reduce risk of side effects, Stop Wellbutrin and start Auvelity 1 in the morning for 1 week then 1 twice daily AndReduce pramipexole 0.5 mg BID and reduce nortriptyline to 50 mg HS  12/17/22 appt noted: Received Spravato 84 mg today as scheduled.  Tolerated it well without nausea or vomiting headache or chest pain or palpitations.  Expected dissociation gradually resolved over the 2 hour observation period. She feels 50% less depressed with Spravto and wants to continue it.   Continues meds Adderall XR 20 mg every morning, stopped Wellbutrin XL 150 every morning, clonidine 0.2 mg twice daily, lorazepam 1 mg every 8 hours as  needed.  Trazodone 50 mg tablets 1-2 nightly as needed insomnia Has started nortriptyline 75 mg nightly for about 3 weeks and DT level 176, reduced to 50 mg HS early November. pramipexole 0.375 mg twice daily off label for treatment resistant depression with plan to stop Started Auvelity 1 AM Still depressed to moderate degree.  Tolerating Auvelity so far.  No other problems with meds.  Willing to give Auvelity a chance. Plan: She agrees to [employer].  Continue 1 AM and when tolerated then increase to BID Stop pramipexole.  12/26/22 received Spravato  01/01/23 appt noted: Received Spravato 84 mg today as scheduled.  Tolerated it well without nausea or vomiting headache or chest pain or palpitations.  Expected dissociation gradually resolved over the 2 hour observation period. She feels 50% less depressed with Spravto and wants to continue it.   Continues meds Adderall XR 20 mg every morning, stopped Wellbutrin XL 150 every morning, clonidine 0.2 mg twice daily, lorazepam 1 mg every 8 hours as needed.  Trazodone 50 mg tablets 1-2 nightly as needed insomnia Has started nortriptyline 75 mg nightly for about 3 weeks and DT level 176, reduced to 50 mg HS early November. Forgot to reduce pramipexole stoll taking  0.5 mg twice daily off  label for treatment resistant depression  Started Auvelity 1 AM & PM last week. Occ misses Spravato DT htn.this week a better than last.   Painting again more and it helps mood.  Confidence still low and not as likely to socialize as normal but enjoys family.   Still ambivalent about TMS. Tolerating meds.  Including the increase in [place].    01/08/23 appt noted: Received Spravato 84 mg today as scheduled.  Tolerated it well without nausea or vomiting headache or chest pain or palpitations.  Expected dissociation gradually resolved over the 2 hour observation period. She feels 50% less depressed with Spravto and wants to continue it.   Continues meds Adderall XR 20 mg every morning, stopped Wellbutrin XL 150 every morning, clonidine 0.2 mg twice daily, lorazepam 1 mg every 8 hours as needed.  Trazodone 50 mg tablets 1-2 nightly as needed insomnia Has started nortriptyline 75 mg nightly for about 3 weeks and DT level 176, reduced to 50 mg HS early November. reduced pramipexole to 0.5 mg daily off label for treatment resistant depression  Started Auvelity 1 AM & PM mid Feb. More depressed as week progresses.  Thinks it is worse with less pramipexole.  More negative.  Able to function but feels miserable.  No SI.  Hopeless.  Sleep ok.  Tolerating meds.   Talked to [facility] about TMS and appt mid March. Plan: increase pramipexole to 0.5 mg TID for dep bc seemed to worsen with the reduction.  01/15/23 appt noted: Received Spravato 84 mg today as scheduled.  Tolerated it well without nausea or vomiting headache or chest pain or palpitations.  Expected dissociation gradually resolved over the 2 hour observation period. She feels 50% less depressed with Spravto and wants to continue it.   Continues meds Adderall XR 20 mg every morning, stopped Wellbutrin XL 150 every morning, clonidine 0.2 mg twice daily, lorazepam 1 mg every 8 hours as needed.  Trazodone 50 mg tablets 1-2 nightly as needed  insomnia Has started nortriptyline 75 mg nightly for about 3 weeks and DT level 176, reduced to 50 mg HS early November. Increased pramipexole to 0.5 mg  to TID  off label for treatment resistant depression  Started Auvelity 1  AM & PM mid Feb. markedly better depression the increase in pramipexole.  She feels like her depression is almost resolved.  She is very pleased with the response.  She is tolerating the medications well  01/30/23 appt noted: Received Spravato 84 mg today as scheduled.  Tolerated it well without nausea or vomiting headache or chest pain or palpitations.  Expected dissociation gradually resolved over the 2 hour observation period. She feels 50% less depressed with Spravto and wants to continue it.   Continues meds Adderall XR 20 mg every morning, stopped Wellbutrin XL 150 every morning, clonidine 0.2 mg twice daily, lorazepam 1 mg every 8 hours as needed.  Trazodone 50 mg tablets 1-2 nightly as needed insomnia Has started nortriptyline 75 mg nightly for about 3 weeks and DT level 176, reduced to 50 mg HS early November. Increased pramipexole to 0.5 mg  to TID  off label for treatment resistant depression  Started Auvelity 1 AM & PM mid Feb. Mood markedly better with pramipexole added.  Nearly normal mood now with minimal depression.  More social and outgoing and motivated and resolved anhedonia. No SE.  Compliant.  02/04/23 appt noted: Continues meds Adderall XR 20 mg every morning, stopped Wellbutrin XL 150 every morning, clonidine 0.2 mg twice daily, lorazepam 1 mg every 8 hours as needed.  Trazodone 50 mg tablets 1-2 nightly as needed insomnia Has started nortriptyline 75 mg nightly for about 3 weeks and DT level 176, reduced to 50 mg HS early November. Increased pramipexole to 0.5 mg  to TID  off label for treatment resistant depression  Started Auvelity 1 AM & PM mid Feb. Received Spravato 84 mg today as scheduled.  Tolerated it well without nausea or vomiting headache  or chest pain or palpitations.  Expected dissociation gradually resolved over the 2 hour observation period. She feels 50% less depressed with Spravto and wants to continue it.  The pramipexole got her the rest of the way better.  Doesn't want to cut back on any tx bc afraid of relapse. Tolerating meds.  02/15/23 appt noted: Continues meds Adderall XR 20 mg every morning, stopped Wellbutrin XL 150 every morning, clonidine 0.2 mg twice daily, lorazepam 1 mg every 8 hours as needed.  Trazodone 50 mg tablets 1-2 nightly as needed insomnia Has started nortriptyline 75 mg nightly for about 3 weeks and DT level 176, reduced to 50 mg HS early November. Increased pramipexole to 0.5 mg  to TID  off label for treatment resistant depression  Started Auvelity 1 AM & PM mid Feb. Received Spravato 84 mg today as scheduled.  Tolerated it well without nausea or vomiting headache or chest pain or palpitations.  Expected dissociation gradually resolved over the 2 hour observation period. She feels no longer depressed.   The pramipexole got her the rest of the way better.  Doesn't want to cut back on any tx bc afraid of relapse. Tolerating meds.  02/18/23 appt noted: Continues meds Adderall XR 20 mg every morning, stopped Wellbutrin XL 150 every morning, clonidine 0.2 mg twice daily, lorazepam 1 mg every 8 hours as needed.  Trazodone 50 mg tablets 1-2 nightly as needed insomnia Has started nortriptyline 75 mg nightly for about 3 weeks and DT level 176, reduced to 50 mg HS early November. Increased pramipexole to 0.5 mg  to TID  off label for treatment resistant depression  Started Auvelity 1 AM & PM mid Feb. Received Spravato 84 mg today as scheduled.  Tolerated it well  without nausea or vomiting headache or chest pain or palpitations.  Expected dissociation gradually resolved over the 2 hour observation period. She feels no longer depressed except when ran out of Auvelity this week DT lack of availability.  Much worse  without it..   The pramipexole got her the rest of the way better.  Doesn't want to cut back on any tx bc afraid of relapse. Tolerating meds.  02/25/23 appt: Received Spravato 84 mg today as scheduled.  Tolerated it well without nausea or vomiting headache or chest pain or palpitations.  Expected dissociation gradually resolved over the 2 hour observation period. Continues meds Adderall XR 20 mg every morning, stopped Wellbutrin XL 150 every morning, clonidine 0.2 mg twice daily, lorazepam 1 mg every 8 hours as needed.  Trazodone 50 mg tablets 1-2 nightly as needed insomnia Has started nortriptyline 75 mg nightly for about 3 weeks and DT level 176, reduced to 50 mg HS early November. Increased pramipexole to 0.5 mg  to TID  off label for treatment resistant depression  Started Auvelity 1 AM & PM mid Feb. No SE Still dramatically better with meds and Spravato.  Not 100% over depression. Had GS born last week and able to enjoy it now.  Is a little sleepy with pramipexole but manageable.  No change desired.  Sleep is ok.  03/04/23 appt noted: Received Spravato 84 mg today as scheduled.  Tolerated it well without nausea or vomiting headache or chest pain or palpitations.  Expected dissociation gradually resolved over the 2 hour observation period. Continues meds Adderall XR 20 mg every morning, stopped Wellbutrin XL 150 every morning, clonidine 0.2 mg twice daily, lorazepam 1 mg every 8 hours as needed.  Trazodone 50 mg tablets 1-2 nightly as needed insomnia nortriptyline reduced to 50 mg HS early November. Increased pramipexole to 0.5 mg  to TID  off label for treatment resistant depression  Started Auvelity 1 AM & PM mid Feb. Still generally doing well with meds and Spravato except when got tooth abscess.  Freels more depressed after dental surgery.   No SE with meds.   Sleep is OK.  No med changes desired  03/11/23 appt noted: Received Spravato 84 mg today as scheduled.  Tolerated it well  without nausea or vomiting headache or chest pain or palpitations.  Expected dissociation gradually resolved over the 2 hour observation period. Continues meds Adderall XR 20 mg every morning, stopped Wellbutrin XL 150 every morning, clonidine 0.2 mg twice daily, lorazepam 1 mg every 8 hours as needed.  Trazodone 50 mg tablets 1-2 nightly as needed insomnia nortriptyline reduced to 50 mg HS early November. Increased pramipexole to 0.5 mg  to TID  off label for treatment resistant depression  Started Auvelity 1 AM & PM mid Feb. Some increase depression with infection and needing amoxiciliin this week.  Otherwie still doing well with mood and meds. No med changes desired or indicated. No SE  03/21/2023 appointment noted: Continues meds Adderall XR 20 mg every morning, stopped Wellbutrin XL 150 every morning, clonidine 0.2 mg twice daily, lorazepam 1 mg every 8 hours as needed.  Trazodone 50 mg tablets 1-2 nightly as needed insomnia nortriptyline reduced to 50 mg HS early November. Increased pramipexole to 0.5 mg  to TID  off label for treatment resistant depression  Started Auvelity 1 AM & PM mid Feb. Received Spravato 84 mg today as scheduled.  Tolerated it well without nausea or vomiting headache or chest pain or palpitations.  Expected  dissociation gradually resolved over the 2 hour observation period. Depression is much better.  Pretty much resolved this week.  She feels the Auvelity and pramipexole have made the biggest difference.  Obviously the Spravato is also helping significantly.  She wants to continue all of these medications.  She still has anxiety easily.  Especially facing something that she would rather avoid.  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 Tolerating meds without significant side effects. Plan no med changes  03/29/23 appt noted: Continues meds Adderall XR 20 mg every morning, stopped  Wellbutrin XL 150 every morning, clonidine 0.2 mg twice daily, lorazepam 1 mg every 8 hours as needed.  Trazodone 50 mg tablets 1-2 nightly as needed insomnia, nortriptyline 50 mg HS,  pramipexole 0.5 mg  to TID  off label for treatment resistant depression  Started Auvelity 1 AM & PM mid Feb 2024 No SE Received Spravato 84 mg today as scheduled.  Tolerated it well without nausea or vomiting headache or chest pain or palpitations.  Expected dissociation gradually resolved over the 2 hour observation period. Depression continues to improve to a point of very mild sx.  Still hasn't gotten her confidence fully back.  She does enjoy social interactions and is productive at work. She is tolerating the meds well without side effects.  We discussed the possibility of reducing some of the medication given the marked response which was positive with pramipexole.  04/04/23 appt noted: Continues meds Adderall XR 20 mg every morning, stopped Wellbutrin XL 150 every morning, clonidine 0.2 mg twice daily, lorazepam 1 mg every 8 hours as needed.  Trazodone 50 mg tablets 1-2 nightly as needed insomnia, nortriptyline 50 mg HS,  pramipexole 0.5 mg  to TID  off label for treatment resistant depression  Started Auvelity 1 AM & PM mid Feb 2024 No SE Received Spravato 84 mg today as scheduled.  Tolerated it well without nausea or vomiting headache or chest pain or palpitations.  Expected dissociation gradually resolved over the 2 hour observation period. Dep is still much improved with increase in pramipexole.  Largely resolved.  Still gets anxious but takes less lorazepam prn but still needs it.  Sleep ok.  Tolerating meds.  04/10/23 appt noted: Continues meds Adderall XR 20 mg every morning, clonidine 0.2 mg twice daily, lorazepam 1 mg every 8 hours as needed.  Trazodone 50 mg tablets 1-2 nightly as needed insomnia, nortriptyline 50 mg HS,  pramipexole 0.5 mg  to TID  off label for treatment resistant depression  Started  Auvelity 1 AM & PM mid Feb 2024 No SE Received Spravato 84 mg today as scheduled.  Tolerated it well without nausea or vomiting headache or chest pain or palpitations.  Expected dissociation gradually resolved over the 2 hour observation period. Dep is still much improved with increase in pramipexole.  Largely resolved.  Still gets anxious but takes less lorazepam prn but still needs it.  Sleep ok.  Tolerating meds. Did an art show in another town; I couldn't have done this a year ago.    ECT-MADRS    Flowsheet Row Clinical Support from 01/08/2023 in [facility] Clinical Support from 08/06/2022 in [facility] Clinical Support from 07/04/2022 in [facility] Clinical Support from 05/21/2022 in [facility] Office Visit from 03/02/2022 in [facility]  MADRS  Total Score 21 29 15 27  46        Past Psychiatric Medication Trials: fluoxetine, duloxetine, Viibryd, Pristiq, sertraline, citalopram,  Trintellix anxious and SI Wellbutrin XL 450 Auvelity 1 dose nortriptyline 75 mg nightly for about 3 weeks and DT level 176, reduced to 50 mg HS NR. Pramipexole 1 mg NR Adderall, Adderall XR, Vyvanse, Ritalin, Strattera low dose NR Lorazepam Trazodone  Depakote,  lamotrigine cog complaints Lithium remotely Abilify 7.5  Vraylar 1.5 mg daily agitation and insomnia Rexulti insomnia Latuda 40 one dose, CO anxious and SI Seroquel XR 300 Olanzapine 10  At visit November 12, 2019. We discussed Patient developed an increasingly severe alcohol dependence problem since her last visit in January.  She went to [facility] and has had no alcohol since then except 1 day.  She never abused stimulants but they took her off the stimulants at [facility].  Her ADD was markedly worse.  The Wellbutrin did not help the ADD.   D history lamotrigine rash at 67 yo  Review  of Systems:  Review of Systems  Constitutional:  Negative for fatigue.  Musculoskeletal:  Positive for back pain. Negative for arthralgias and joint swelling.       SP hip surgery October 2020  Neurological:  Negative for dizziness, tremors and weakness.  Psychiatric/Behavioral:  Positive for decreased concentration and dysphoric mood. Negative for agitation, behavioral problems, confusion, hallucinations, self-injury, sleep disturbance and suicidal ideas. The patient is nervous/anxious. The patient is not hyperactive.        Forgetful at times about med recommendations.  Send present for him The only [place] she notices a it states it is unbelievable him how how much are her 3 steps around freedom of the present fall and just in the past decade  Medications: I have reviewed the patient's current medications.  Current Outpatient Medications  Medication Sig Dispense Refill   amLODipine (NORVASC) 2.5 MG tablet Take 2.5 mg by mouth daily.     amphetamine-dextroamphetamine (ADDERALL XR) 20 MG 24 hr capsule Take 1 capsule (20 mg total) by mouth every morning. 30 capsule 0   amphetamine-dextroamphetamine (ADDERALL XR) 20 MG 24 hr capsule Take 1 capsule (20 mg total) by mouth every morning. 30 capsule 0   cloNIDine (CATAPRES) 0.2 MG tablet TAKE 1 TABLET BY MOUTH 2 TIMES DAILY. 180 tablet 0   Dextromethorphan-buPROPion ER (AUVELITY) 45-105 MG TBCR Take 1 tablet by mouth 2 (two) times daily. 60 tablet 3   Esketamine HCl, 84 MG Dose, (SPRAVATO, 84 MG DOSE,) 28 MG/DEVICE SOPK USE 3 SPRAYS IN EACH NOSTRIL ONCE A WEEK 3 each 1   iron polysaccharides (NIFEREX) 150 MG capsule TAKE 1 CAPSULE BY MOUTH EVERY DAY 90 capsule 1   LORazepam (ATIVAN) 1 MG tablet Take 1 tablet (1 mg total) by mouth every 8 (eight) hours as needed. for anxiety 90 tablet 1   losartan (COZAAR) 50 MG tablet Take 50 mg by mouth daily.     nebivolol (BYSTOLIC) 2.5 MG tablet Take 2.5 mg by mouth daily.     nortriptyline (PAMELOR) 25 MG  capsule Take 2 capsules (50 mg total) by mouth at bedtime. 180 capsule 0   pramipexole (MIRAPEX) 0.5 MG tablet Take 1 tablet (0.5 mg total) by mouth 3 (three) times daily. 90 tablet 1   traZODone (DESYREL) 50 MG tablet TAKE 1-2 TABLETS BY MOUTH NIGHTLY AS NEEDED FOR SLEEP 180 tablet 1   No current facility-administered medications for this visit.    Medication Side  Effects: None  Allergies:  Allergies  Allergen Reactions   Metronidazole Shortness Of Breath and Other (See Comments)    Heart pounding   Ferrlecit [Na Ferric Gluc Cplx In Sucrose] Other (See Comments)    Infusion reaction 05/12/2019    Past Medical History:  Diagnosis Date   ADHD    Anemia    Anxiety    Arthritis    Depression    Heart murmur    i went to see a cardiologit slast eyar  and i had zero plaque,    PONV (postoperative nausea and vomiting)    Recovering alcoholic in remission (HCC)     Family History  Problem Relation Age of Onset   Atrial fibrillation Mother    CAD Father     Past Medical History, Surgical history, Social history, and Family history were reviewed and updated as appropriate.   Please see review of systems for further details on the patient's review from today.   Objective:   Physical Exam:  There were no vitals taken for this visit.  Physical Exam Constitutional:      General: She is not in acute distress. Neurological:     Mental Status: She is alert and oriented to person, place, and time.     Coordination: Coordination normal.     Gait: Gait normal.  Psychiatric:        Attention and Perception: Attention and perception normal.        Mood and Affect: Mood is anxious. Mood is not depressed.        Speech: Speech is not rapid and pressured.        Behavior: Behavior is not slowed or hyperactive.        Thought Content: Thought content is not paranoid or delusional. Thought content does not include homicidal or suicidal ideation. Thought content does not include  suicidal plan.        Cognition and Memory: Cognition normal. Memory is not impaired.        Judgment: Judgment normal.     Comments: Insight intact. No auditory or visual hallucinations. No delusions.  Depression nearly resolved with pramipexole TID and relapsed when out of Auvelity. No manic sx     Lab Review:     Component Value Date/Time   NA 137 01/12/2021 1430   NA 140 11/18/2018 1544   K 3.8 01/12/2021 1430   CL 108 01/12/2021 1430   CO2 22 01/12/2021 1430   GLUCOSE 94 01/12/2021 1430   BUN 14 01/12/2021 1430   BUN 20 11/18/2018 1544   CREATININE 0.82 01/12/2021 1430   CALCIUM 8.9 01/12/2021 1430   PROT 6.6 01/12/2021 1430   ALBUMIN 3.9 01/12/2021 1430   AST 12 (L) 01/12/2021 1430   ALT 11 01/12/2021 1430   ALKPHOS 46 01/12/2021 1430   BILITOT 0.5 01/12/2021 1430   GFRNONAA >60 01/12/2021 1430   GFRAA >60 09/02/2019 0249   GFRAA >60 01/27/2019 0811       Component Value Date/Time   WBC 4.5 01/12/2021 1430   RBC 4.32 01/12/2021 1430   HGB 12.8 01/12/2021 1430   HGB 12.9 07/17/2019 0953   HCT 38.5 01/12/2021 1430   HCT 21.9 (L) 12/25/2018 1221   PLT 272 01/12/2021 1430   PLT 286 07/17/2019 0953   MCV 89.1 01/12/2021 1430   MCH 29.6 01/12/2021 1430   MCHC 33.2 01/12/2021 1430   RDW 12.4 01/12/2021 1430   LYMPHSABS 1.4 01/12/2021 1430   MONOABS 0.4 01/12/2021  1430   EOSABS 0.0 01/12/2021 1430   BASOSABS 0.0 01/12/2021 1430    No results found for: "POCLITH", "LITHIUM"   No results found for: "PHENYTOIN", "PHENOBARB", "VALPROATE", "CBMZ"   .res Assessment: Plan:    Recurrent major depression resistant to treatment (HCC)  Generalized anxiety disorder  Attention deficit hyperactivity disorder (ADHD), predominantly inattentive type  Insomnia due to mental condition   She has treatment resistant major depression ongoing with 50% better with Spravato. Have  discussed some of her  abnormal behaviors last year leading to this depressive episode getting  worse which she says were associated with heavy use of delta 8 and not a manic episode.  She realizes now that that was not good for her.  She stopped all use of other drugs including those available over-the-counter such as delta 8 or any other THC related products.  She is no longer having any of those types of behaviors and instead is depressed.  She is experiencing more pleasure and is less blunted and enjoying more and better inteest versus before the Spravato.  She also feels worse if misses a dose of Spravato.  Depression nearly resolved with pramipexole TID and relapsed when out of Auvelity.  Depression resolved recently after increasing pramipexole 0.5 mg TID, brief relapse when out of Auvelity Consider switch to ER to reduce sleepiness  Patient was administered Spravato 84 mg intranasally today.  The patient experienced the typical dissociation which gradually resolved over the 2-hour period of observation.  There were no complications.  Specifically the patient did not have nausea or vomiting or headache.  Blood pressures monitored at the 40-minute and 2-hour follow-up intervals.  Borderline high.  By the time the 2-hour observation period was met the patient was alert and oriented and able to exit without assistance.  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  See nursing note for further details.She wants to continue Spravato. We discussed discussed the side effects in detail as well as the protocol required to receive Spravato.   Failed multiple antidepressants.  Many of them were not actual failures but intolerances and it is unclear whether some of that was more connected with anxiety than true side effects.  1 example is the [place].  In general she does not want to try anything but an antidepressant but has failed all major categories of antidepressants except TCAs and MAO inhibitors which have not  been tried until now.  Started nortriptyline 75 mg nightly.  Serum level 176.  So Reduced to 50 mg HS. Consider stopping it.  Discussed side effects in detail.  Needs more time to help. We discussed the possibility of trying to wean off nortriptyline in the hopes that she can maintain an adequate response with the Auvelity plus pramipexole and Spravato.  We will discuss it again and she prefers not to make the change yet.  She is tolerating Auvelity.  Continue 1  BID bc more depressed without it.  Worse with less pramipexole. Increased to 0.5 mg TID on about end of Feb 2024 and markedly better Dosing range off label for depression ranges from 1-5 mg daily.  Disc risk compulsive impulsive behavior and mania.    Started Spravato 84 mg twice weekly on 03/16/2022.  Now on weekly administration  Adderall  XR 20 mg AM   Ativan 1 mg 3 times daily as needed anxiety but try to cut it back.  She still feels  she needs it. Is not ideal to use benzodiazepine with stimulant but because of the severity of her symptoms it has been necessary.  Hope to eventually eliminate the benzodiazepine.  Expected as her depression improves her anxiety will improve as well.   We will expect that to improve as the depression improves.  She has headed insturctions to reduce this.  Continue clonidine 0.2 mg BID off label for anxiety and helps BP partially. BP is better controlled but not consistent.  Consider increasing amlodipine.  Also on losartan 50  Discussed potential benefits, risks, and side effects of stimulants with patient to include increased heart rate, palpitations, insomnia, increased anxiety, increased irritability, or decreased appetite.  Instructed patient to contact office if experiencing any significant tolerability issues. She wants to return to usual dose of Adderall for ADD bc of mor poor cognitive function with reduction.  Also discussed that depression will impair cognitive function.  Disc risk  polypharmacy.  Reevaluate meds after better with pramipexole.  Relapsed after running out of Auvelity so needs clearly it and pramipexole.  Give it more time.  But consider trying to wean nortriptyline in hopes she can do as well with less medication.  Rec keep track of BP and discuss with PCP. It has been up and down.  Sometimes needs extra clonidine before Spravato  Has Maintained sobriety  No med changes indicated today: Continue Adderall XR 20 mg every morning Continue Auvelity 1 tablet twice daily,  continue clonidine 0.2 mg twice daily for anxiety and blood pressure. Continue lorazepam 1 mg 3 times daily she has been unable to cut back on the dose due to anxiety. Continue nortriptyline 50 mg nightly Continue pramipexole 0.5 mg 3 times daily Continue trazodone 50 mg tablets 1-2 nightly as needed for sleep. Consider weaning nortriptyline given insufficient benefot with it.  FU with Spravato weekly   Meredith Staggers, MD, DFAPA     Please see After Visit Summary for patient specific instructions.  No future appointments.                   No orders of the defined types were placed in this encounter.      -------------------------------

## 2023-04-10 NOTE — Progress Notes (Signed)
NURSES NOTE:   Patient arrived for her weekly 84mg  Spravato treatment. Pt is being treated for Treatment Resistant Depression this is #61 treatment, pt will be receiving 84 mg which will continue to be her maintenance dose, she receives weekly treatments.  Patient arrived and taken to treatment room. Confirmed she had a ride home which is her husband would be coming back to pick up pt when done and sometimes she needs to use Benedetto Goad if he is unable to pick her up. Pt's Spravato is ordered through [employer] and delivered to office, all Spravato medication is stored at doctors office per REMS/FDA guidelines. The medication is required to be locked behind two doors per FDA/REMS Protocol. Medication is also disposed of properly per regulations. Pt's prior authorization for Spravato 84 mg was renewed with an approval through 07/17/2023 with [employer]. All treatments with vital signs documented with Spravato REMS per protocol of being a treatment center.    Pt reports feeling very good, she has a new grand baby, she is painting more and had a show this past weekend and she went by herself. Began taking patient's vital signs at 9:35 AM 130/80, pulse 58. Pulse Ox 99%. Gave patient first dose 28 mg nasal spray, each nasal spray administered in each nostril as directed and waited 5 minutes between the second and third dose. All 3 doses given pt did not complain of any nausea/vomiting, given a cup of water due to the taste after the administration of Spravato.  She listens to [employer] with spa or relaxing music. Pt is not sedate today, and her vital signs are stable. She reports her PCP increasing one of her medications to help her B/P. , checked 40 minute vitals at 10:15 AM, 96/57, pulse 59, Pulse Ox 92%. Explained she would be monitored for a total time of 120 minutes. Discharge vitals were taken at 11:35 AM 117/70 P 62, 99% Pulse Ox. Dr. Jennelle Human met with pt today and discussed her medication. I walked pt to  elevator, where her husband met her for the ride home. Recommend she go home and sleep or just relax on the couch. No driving, no intense activities. Verbalized understanding. Nurse was with pt a total of 70 minutes for clinical. Pt will contact me for her next treatment after she checks her schedule. Pt instructed to call office with any problems or questions.      LOT [id number] EXP FEB 2027

## 2023-04-17 ENCOUNTER — Ambulatory Visit (INDEPENDENT_AMBULATORY_CARE_PROVIDER_SITE_OTHER): Payer: 59 | Admitting: Behavioral Health

## 2023-04-17 ENCOUNTER — Ambulatory Visit: Payer: 59

## 2023-04-17 VITALS — BP 118/83 | HR 74

## 2023-04-17 DIAGNOSIS — F339 Major depressive disorder, recurrent, unspecified: Secondary | ICD-10-CM

## 2023-04-17 NOTE — Progress Notes (Signed)
NURSES NOTE:   Patient arrived for her weekly 84mg  Spravato treatment. Pt is being treated for Treatment Resistant Depression this is #63 treatment, pt will be receiving 84 mg which will continue to be her maintenance dose, she receives weekly treatments.  Patient arrived and taken to treatment room. Confirmed she had a ride home which is her husband would be coming back to pick up pt when done and sometimes she needs to use Benedetto Goad if he is unable to pick her up. Pt's Spravato is ordered through [employer] and delivered to office, all Spravato medication is stored at doctors office per REMS/FDA guidelines. The medication is required to be locked behind two doors per FDA/REMS Protocol. Medication is also disposed of properly per regulations. Pt's prior authorization for Spravato 84 mg was renewed with an approval through 07/17/2023 with [facility]. All treatments with vital signs documented with Spravato REMS per protocol of being a treatment center.    Pt reports feeling very good, she has a new grand baby, she is painting more. Began taking patient's vital signs at 2:15 PM 123/83, pulse 64. Pulse Ox 99%. Gave patient first dose 28 mg nasal spray, each nasal spray administered in each nostril as directed and waited 5 minutes between the second and third dose. All 3 doses given pt did not complain of any nausea/vomiting, given a cup of water due to the taste after the administration of Spravato.  She listens to [employer] with spa or relaxing music. Pt is not sedate today, and her vital signs are stable. She reports her PCP increasing one of her medications to help her B/P. , checked 40 minute vitals at 3:00 PM, 101/66, pulse 63, Pulse Ox 92%. Explained she would be monitored for a total time of 120 minutes. Discharge vitals were taken at 4:20 PM 118/83 P 74, 99% Pulse Ox. Avelina Laine NP met with pt today and discussed her medication. I walked pt to elevator, where her husband met her for the ride home.  Recommend she go home and sleep or just relax on the couch. No driving, no intense activities. Verbalized understanding. Nurse was with pt a total of 70 minutes for clinical. Pt will be scheduled, next Monday, June 10th. Pt instructed to call office with any problems or questions.      LOT 23MG [id number] EXP FEB 2027

## 2023-04-17 NOTE — Progress Notes (Signed)
[name] [id number] 1956-05-28 67 y.o.  Subjective:   Patient ID:  Laura Chang is a 67 y.o. (DOB 01-Jul-1956) female.  Chief Complaint: No chief complaint on file.   HPI Alexsandra D Ertle presents to the office today for follow-up of treatment resistant depression (TRD).   Spravato treatment    Patient was administered Spravato 84 mg intranasally today. Patient was observed by provider throughout [facility] treatment. The patient experienced the typical dissociation which gradually resolved over the 2-hour period of observation. There were no complications. Specifically, the patient did not have any untoward side effects - feeling disconnected from themself, their thoughts, feelings and things around them, dizziness, nausea, feeling sleepy, decreased feeling of sensitivity (numbness) spinning sensation, feeling anxious, lack of energy, increased blood pressure, feeling happy or very excited, or headache. Blood pressures remained within normal ranges at the 40-minute and 2-hour follow-up intervals. By the time the 2-hour observation period was met the patient was alert and oriented and able to exit without assistance. Patient willing to continue Spravato administration for the treatment of resistant depression. See nursing note for further details.     ECT-MADRS    Flowsheet Row Clinical Support from 01/08/2023 in [facility] Clinical Support from 08/06/2022 in [facility] Clinical Support from 07/04/2022 in [facility] Clinical Support from 05/21/2022 in [facility] Office Visit from 03/02/2022 in [facility]  MADRS Total Score 21 29 15 27  46        Review of Systems:  Review of Systems  Constitutional: Negative.   Allergic/Immunologic: Negative.   Neurological: Negative.   Psychiatric/Behavioral:  Positive for dysphoric mood.      Medications: I have reviewed the patient's current medications.  Current Outpatient Medications  Medication Sig Dispense Refill  . amLODipine (NORVASC) 2.5 MG tablet Take 2.5 mg by mouth daily.    Marland Kitchen amphetamine-dextroamphetamine (ADDERALL XR) 20 MG 24 hr capsule Take 1 capsule (20 mg total) by mouth every morning. 30 capsule 0  . amphetamine-dextroamphetamine (ADDERALL XR) 20 MG 24 hr capsule Take 1 capsule (20 mg total) by mouth every morning. 30 capsule 0  . cloNIDine (CATAPRES) 0.2 MG tablet TAKE 1 TABLET BY MOUTH 2 TIMES DAILY. 180 tablet 0  . Dextromethorphan-buPROPion ER (AUVELITY) 45-105 MG TBCR Take 1 tablet by mouth 2 (two) times daily. 60 tablet 3  . Esketamine HCl, 84 MG Dose, (SPRAVATO, 84 MG DOSE,) 28 MG/DEVICE SOPK USE 3 SPRAYS IN EACH NOSTRIL ONCE A WEEK 3 each 1  . iron polysaccharides (NIFEREX) 150 MG capsule TAKE 1 CAPSULE BY MOUTH EVERY DAY 90 capsule 1  . LORazepam (ATIVAN) 1 MG tablet Take 1 tablet (1 mg total) by mouth every 8 (eight) hours as needed. for anxiety 90 tablet 1  . losartan (COZAAR) 50 MG tablet Take 50 mg by mouth daily.    . nebivolol (BYSTOLIC) 2.5 MG tablet Take 2.5 mg by mouth daily.    . nortriptyline (PAMELOR) 25 MG capsule Take 2 capsules (50 mg total) by mouth at bedtime. 180 capsule 0  . pramipexole (MIRAPEX) 0.5 MG tablet Take 1 tablet (0.5 mg total) by mouth 3 (three) times daily. 90 tablet 1  . traZODone (DESYREL) 50 MG tablet TAKE 1-2 TABLETS BY MOUTH NIGHTLY AS NEEDED FOR SLEEP 180 tablet 1   No current facility-administered medications for this visit.    Medication Side Effects: None  Allergies:  Allergies  Allergen Reactions  .  Metronidazole Shortness Of Breath and Other (See Comments)    Heart pounding  . Ferrlecit [Na Ferric Gluc Cplx In Sucrose] Other (See Comments)    Infusion reaction 05/12/2019    Past Medical History:  Diagnosis Date  . ADHD   . Anemia   . Anxiety   . Arthritis   . Depression   . Heart murmur    i  went to see a cardiologit slast eyar  and i had zero plaque,   . PONV (postoperative nausea and vomiting)   . Recovering alcoholic in remission [facility])     Past Medical History, Surgical history, Social history, and Family history were reviewed and updated as appropriate.   Please see review of systems for further details on the patient's review from today.   Objective:   Physical Exam:  There were no vitals taken for this visit.  Physical Exam Psychiatric:        Attention and Perception: Attention and perception normal.        Mood and Affect: Mood and affect normal.        Speech: Speech normal.        Behavior: Behavior normal. Behavior is cooperative.        Cognition and Memory: Cognition and memory normal.        Judgment: Judgment normal.    Lab Review:     Component Value Date/Time   NA 137 01/12/2021 1430   NA 140 11/18/2018 1544   K 3.8 01/12/2021 1430   CL 108 01/12/2021 1430   CO2 22 01/12/2021 1430   GLUCOSE 94 01/12/2021 1430   BUN 14 01/12/2021 1430   BUN 20 11/18/2018 1544   CREATININE 0.82 01/12/2021 1430   CALCIUM 8.9 01/12/2021 1430   PROT 6.6 01/12/2021 1430   ALBUMIN 3.9 01/12/2021 1430   AST 12 (L) 01/12/2021 1430   ALT 11 01/12/2021 1430   ALKPHOS 46 01/12/2021 1430   BILITOT 0.5 01/12/2021 1430   GFRNONAA >60 01/12/2021 1430   GFRAA >60 09/02/2019 0249   GFRAA >60 01/27/2019 0811       Component Value Date/Time   WBC 4.5 01/12/2021 1430   RBC 4.32 01/12/2021 1430   HGB 12.8 01/12/2021 1430   HGB 12.9 07/17/2019 0953   HCT 38.5 01/12/2021 1430   HCT 21.9 (L) 12/25/2018 1221   PLT 272 01/12/2021 1430   PLT 286 07/17/2019 0953   MCV 89.1 01/12/2021 1430   MCH 29.6 01/12/2021 1430   MCHC 33.2 01/12/2021 1430   RDW 12.4 01/12/2021 1430   LYMPHSABS 1.4 01/12/2021 1430   MONOABS 0.4 01/12/2021 1430   EOSABS 0.0 01/12/2021 1430   BASOSABS 0.0 01/12/2021 1430    No results found for: "POCLITH", "LITHIUM"   No results found for:  "PHENYTOIN", "PHENOBARB", "VALPROATE", "CBMZ"   .res Assessment: Plan:    Recommendations/Plan   Continue Spravato treatment.   Patient advised to contact office with any questions, adverse effects, or acute worsening in signs and symptoms.   Arlys John A. Kunal Levario, NP  Diagnoses and all orders for this visit:  Recurrent major depression resistant to treatment [facility])     Please see After Visit Summary for patient specific instructions.  No future appointments.  No orders of the defined types were placed in this encounter.   -------------------------------

## 2023-04-19 ENCOUNTER — Other Ambulatory Visit: Payer: Self-pay | Admitting: Psychiatry

## 2023-04-22 ENCOUNTER — Ambulatory Visit: Payer: 59

## 2023-04-22 ENCOUNTER — Ambulatory Visit (INDEPENDENT_AMBULATORY_CARE_PROVIDER_SITE_OTHER): Payer: 59 | Admitting: Behavioral Health

## 2023-04-22 VITALS — BP 100/69 | HR 60

## 2023-04-22 DIAGNOSIS — F339 Major depressive disorder, recurrent, unspecified: Secondary | ICD-10-CM

## 2023-04-22 NOTE — Progress Notes (Signed)
Laura Chang [id number] 10-10-1956 67 y.o.  Subjective:   Patient ID:  Laura Chang is a 67 y.o. (DOB Sep 26, 1956) female.  Chief Complaint: No chief complaint on file.   HPI  Kaiya D Chausse presents to the office today for follow-up of treatment resistant depression (TRD).   Spravato treatment    Patient was administered Spravato 84 mg intranasally today. Patient was observed by provider throughout [facility] treatment. The patient experienced the typical dissociation which gradually resolved over the 2-hour period of observation. There were no complications. Specifically, the patient did not have any untoward side effects - feeling disconnected from themself, their thoughts, feelings and things around them, dizziness, nausea, feeling sleepy, decreased feeling of sensitivity (numbness) spinning sensation, feeling anxious, lack of energy, increased blood pressure, feeling happy or very excited, or headache. Blood pressures remained within normal ranges at the 40-minute and 2-hour follow-up intervals. By the time the 2-hour observation period was met the patient was alert and oriented and able to exit without assistance. Patient willing to continue Spravato administration for the treatment of resistant depression. See nursing note for further details.   ECT-MADRS    Flowsheet Row Clinical Support from 01/08/2023 in [facility] Clinical Support from 08/06/2022 in [facility] Clinical Support from 07/04/2022 in [facility] Clinical Support from 05/21/2022 in [facility] Office Visit from 03/02/2022 in [facility]  MADRS Total Score 21 29 15 27  46        Review of Systems:  Review of Systems  Constitutional: Negative.   Allergic/Immunologic: Negative.   Neurological: Negative.   Psychiatric/Behavioral:  Positive for dysphoric mood.     Medications:  I have reviewed the patient's current medications.  Current Outpatient Medications  Medication Sig Dispense Refill   amLODipine (NORVASC) 2.5 MG tablet Take 2.5 mg by mouth daily.     amphetamine-dextroamphetamine (ADDERALL XR) 20 MG 24 hr capsule Take 1 capsule (20 mg total) by mouth every morning. 30 capsule 0   amphetamine-dextroamphetamine (ADDERALL XR) 20 MG 24 hr capsule Take 1 capsule (20 mg total) by mouth every morning. 30 capsule 0   cloNIDine (CATAPRES) 0.2 MG tablet TAKE 1 TABLET BY MOUTH 2 TIMES DAILY. 180 tablet 0   Dextromethorphan-buPROPion ER (AUVELITY) 45-105 MG TBCR Take 1 tablet by mouth 2 (two) times daily. 60 tablet 3   Esketamine HCl, 84 MG Dose, (SPRAVATO, 84 MG DOSE,) 28 MG/DEVICE SOPK USE 3 SPRAYS IN EACH NOSTRIL ONCE A WEEK 3 each 2   iron polysaccharides (NIFEREX) 150 MG capsule TAKE 1 CAPSULE BY MOUTH EVERY DAY 90 capsule 1   LORazepam (ATIVAN) 1 MG tablet Take 1 tablet (1 mg total) by mouth every 8 (eight) hours as needed. for anxiety 90 tablet 1   losartan (COZAAR) 50 MG tablet Take 50 mg by mouth daily.     nebivolol (BYSTOLIC) 2.5 MG tablet Take 2.5 mg by mouth daily.     nortriptyline (PAMELOR) 25 MG capsule Take 2 capsules (50 mg total) by mouth at bedtime. 180 capsule 0   pramipexole (MIRAPEX) 0.5 MG tablet Take 1 tablet (0.5 mg total) by mouth 3 (three) times daily. 90 tablet 1   traZODone (DESYREL) 50 MG tablet TAKE 1-2 TABLETS BY MOUTH NIGHTLY AS NEEDED FOR SLEEP 180 tablet 1   No current facility-administered medications for this visit.    Medication Side Effects: None  Allergies:  Allergies  Allergen Reactions  Metronidazole Shortness Of Breath and Other (See Comments)    Heart pounding   Ferrlecit [Na Ferric Gluc Cplx In Sucrose] Other (See Comments)    Infusion reaction 05/12/2019    Past Medical History:  Diagnosis Date   ADHD    Anemia    Anxiety    Arthritis    Depression    Heart murmur    i went to see a cardiologit slast eyar   and i had zero plaque,    PONV (postoperative nausea and vomiting)    Recovering alcoholic in remission [facility])     Past Medical History, Surgical history, Social history, and Family history were reviewed and updated as appropriate.   Please see review of systems for further details on the patient's review from today.   Objective:   Physical Exam:  There were no vitals taken for this visit.  Physical Exam Psychiatric:        Attention and Perception: Attention and perception normal.        Mood and Affect: Mood and affect normal.        Speech: Speech normal.        Behavior: Behavior normal. Behavior is cooperative.        Cognition and Memory: Cognition and memory normal.        Judgment: Judgment normal.     Lab Review:     Component Value Date/Time   NA 137 01/12/2021 1430   NA 140 11/18/2018 1544   K 3.8 01/12/2021 1430   CL 108 01/12/2021 1430   CO2 22 01/12/2021 1430   GLUCOSE 94 01/12/2021 1430   BUN 14 01/12/2021 1430   BUN 20 11/18/2018 1544   CREATININE 0.82 01/12/2021 1430   CALCIUM 8.9 01/12/2021 1430   PROT 6.6 01/12/2021 1430   ALBUMIN 3.9 01/12/2021 1430   AST 12 (L) 01/12/2021 1430   ALT 11 01/12/2021 1430   ALKPHOS 46 01/12/2021 1430   BILITOT 0.5 01/12/2021 1430   GFRNONAA >60 01/12/2021 1430   GFRAA >60 09/02/2019 0249   GFRAA >60 01/27/2019 0811       Component Value Date/Time   WBC 4.5 01/12/2021 1430   RBC 4.32 01/12/2021 1430   HGB 12.8 01/12/2021 1430   HGB 12.9 07/17/2019 0953   HCT 38.5 01/12/2021 1430   HCT 21.9 (L) 12/25/2018 1221   PLT 272 01/12/2021 1430   PLT 286 07/17/2019 0953   MCV 89.1 01/12/2021 1430   MCH 29.6 01/12/2021 1430   MCHC 33.2 01/12/2021 1430   RDW 12.4 01/12/2021 1430   LYMPHSABS 1.4 01/12/2021 1430   MONOABS 0.4 01/12/2021 1430   EOSABS 0.0 01/12/2021 1430   BASOSABS 0.0 01/12/2021 1430    No results found for: "POCLITH", "LITHIUM"   No results found for: "PHENYTOIN", "PHENOBARB", "VALPROATE",  "CBMZ"   .res Assessment: Plan:     Recommendations/Plan   Continue Spravato treatment.   Patient advised to contact office with any questions, adverse effects, or acute worsening in signs and symptoms.     Arlys John A. [place] Giammona, NP    There are no diagnoses linked to this encounter.   Please see After Visit Summary for patient specific instructions.  No future appointments.  No orders of the defined types were placed in this encounter.   -------------------------------

## 2023-04-22 NOTE — Progress Notes (Signed)
NURSES NOTE:   Patient arrived for her weekly 84mg  Spravato treatment. Pt is being treated for Treatment Resistant Depression this is #63 treatment, pt will be receiving 84 mg which will continue to be her maintenance dose, she receives weekly treatments.  Patient arrived and taken to treatment room. Confirmed she had a ride home which is her husband would be coming back to pick up pt when done and sometimes she needs to use Benedetto Goad if he is unable to pick her up. Pt's Spravato is ordered through [employer] and delivered to office, all Spravato medication is stored at doctors office per REMS/FDA guidelines. The medication is required to be locked behind two doors per FDA/REMS Protocol. Medication is also disposed of properly per regulations. Pt's prior authorization for Spravato 84 mg was renewed with an approval through 07/17/2023 with [employer]. All treatments with vital signs documented with Spravato REMS per protocol of being a treatment center.    Pt reports feeling very good, she has a new grand baby, she is painting more. Pt is doing extremely well.  Began taking patient's vital signs at 10:10 AM 121/88, pulse 75. Pulse Ox 99%. Gave patient first dose 28 mg nasal spray, each nasal spray administered in each nostril as directed and waited 5 minutes between the second and third dose. All 3 doses given pt did not complain of any nausea/vomiting, given a cup of water due to the taste after the administration of Spravato.  She listens to [employer] with spa or relaxing music. Pt is not sedate today, and her vital signs are stable. Checked 40 minute vitals at 11:00 AM, 125/97, pulse 85, Pulse Ox 92%. Explained she would be monitored for a total time of 120 minutes. Discharge vitals were taken at 12:05 PM 100/69 P 60, 99% Pulse Ox. Avelina Laine NP met with pt today and discussed her medication. I walked pt to elevator, where her husband met her for the ride home. Recommend she go home and sleep or just relax  on the couch. No driving, no intense activities. Verbalized understanding. Nurse was with pt a total of 70 minutes for clinical. Pt will be scheduled, next week but she says she will call me back to let me know.. Pt instructed to call office with any problems or questions.      LOT 23MG 511 EXP FEB 2027

## 2023-04-26 ENCOUNTER — Other Ambulatory Visit: Payer: Self-pay

## 2023-04-26 DIAGNOSIS — F900 Attention-deficit hyperactivity disorder, predominantly inattentive type: Secondary | ICD-10-CM

## 2023-04-26 MED ORDER — AMPHETAMINE-DEXTROAMPHET ER 20 MG PO CP24
20.0000 mg | ORAL_CAPSULE | ORAL | 0 refills | Status: DC
Start: 2023-04-26 — End: 2023-05-07

## 2023-04-30 ENCOUNTER — Ambulatory Visit (INDEPENDENT_AMBULATORY_CARE_PROVIDER_SITE_OTHER): Payer: 59 | Admitting: Behavioral Health

## 2023-04-30 ENCOUNTER — Ambulatory Visit: Payer: 59

## 2023-04-30 VITALS — BP 92/67 | HR 71

## 2023-04-30 DIAGNOSIS — F339 Major depressive disorder, recurrent, unspecified: Secondary | ICD-10-CM

## 2023-04-30 NOTE — Progress Notes (Signed)
NURSES NOTE:   Patient arrived for her weekly 84mg  Spravato treatment. Pt is being treated for Treatment Resistant Depression this is #65 treatment, pt will be receiving 84 mg which will continue to be her maintenance dose, she receives weekly treatments.  Patient arrived and taken to treatment room. Confirmed she had a ride home which is her husband would be coming back to pick up pt when done and sometimes she needs to use [facility] Goad if he is unable to pick her up. Pt's Spravato is ordered through [employer] and delivered to office, all Spravato medication is stored at doctors office per REMS/FDA guidelines. The medication is required to be locked behind two doors per FDA/REMS Protocol. Medication is also disposed of properly per regulations. Pt's prior authorization for Spravato 84 mg was renewed with an approval through 07/17/2023 with [facility] [facility]. All treatments with vital signs documented with Spravato REMS per protocol of being a treatment center.    Pt reports feeling very good, she has a new grand baby, she is painting more. Pt is doing extremely well. Very talkative today. Began taking patient's vital signs at 9:45 AM 106/69, pulse 73. Pulse Ox 99%. Gave patient first dose 28 mg nasal spray, each nasal spray administered in each nostril as directed and waited 5 minutes between the second and third dose. All 3 doses given pt did not complain of any nausea/vomiting, given a cup of water due to the taste after the administration of Spravato.  She listens to [employer] with spa or relaxing music. Pt is not sedate today, and her vital signs are stable. Checked 40 minute vitals at 10:30 AM, 105/72, pulse 71, Pulse Ox 92%. Explained she would be monitored for a total time of 120 minutes. Discharge vitals were taken at 11:50 PM 92/67 P 71, 99% Pulse Ox. Avelina Laine NP met with pt today and discussed her medication. I walked pt to elevator, where her husband met her for the ride home. Recommend she go home and  sleep or just relax on the couch. No driving, no intense activities. Verbalized understanding. Nurse was with pt a total of 70 minutes for clinical. Pt will be scheduled, next week but she says she will call me back to let me know.. Pt instructed to call office with any problems or questions.      LOT 23MG 511 EXP FEB 2027

## 2023-04-30 NOTE — Progress Notes (Signed)
Laura Chang [id number] [birth year] 67 y.o.  Subjective:   Patient ID:  Laura Chang is a 67 y.o. (DOB 1956/01/30) female.  Chief Complaint: No chief complaint on file.   HPI Laura Chang presents to the office today for follow-up of treatment resistant depression (TRD).   Spravato treatment    Patient was administered Spravato 84 mg intranasally today. Patient was observed by provider throughout [facility] treatment. The patient experienced the typical dissociation which gradually resolved over the 2-hour period of observation. There were no complications. Specifically, the patient did not have any untoward side effects - feeling disconnected from themself, their thoughts, feelings and things around them, dizziness, nausea, feeling sleepy, decreased feeling of sensitivity (numbness) spinning sensation, feeling anxious, lack of energy, increased blood pressure, feeling happy or very excited, or headache. Blood pressures remained within normal ranges at the 40-minute and 2-hour follow-up intervals. By the time the 2-hour observation period was met the patient was alert and oriented and able to exit without assistance. Patient willing to continue Spravato administration for the treatment of resistant depression. See nursing note for further details.     ECT-MADRS    Flowsheet Row Clinical Support from 01/08/2023 in [facility] Clinical Support from 08/06/2022 in [facility] Clinical Support from 07/04/2022 in [facility] Clinical Support from 05/21/2022 in [facility] Office Visit from 03/02/2022 in [facility]  MADRS Total Score 21 29 15 27  46        Review of Systems:  Review of Systems  Constitutional: Negative.   Allergic/Immunologic: Negative.   Neurological: Negative.   Psychiatric/Behavioral:  Positive for dysphoric mood.      Medications: I have reviewed the patient's current medications.  Current Outpatient Medications  Medication Sig Dispense Refill  . amLODipine (NORVASC) 2.5 MG tablet Take 2.5 mg by mouth daily.    Marland Kitchen amphetamine-dextroamphetamine (ADDERALL XR) 20 MG 24 hr capsule Take 1 capsule (20 mg total) by mouth every morning. 30 capsule 0  . amphetamine-dextroamphetamine (ADDERALL XR) 20 MG 24 hr capsule Take 1 capsule (20 mg total) by mouth every morning. 30 capsule 0  . cloNIDine (CATAPRES) 0.2 MG tablet TAKE 1 TABLET BY MOUTH 2 TIMES DAILY. 180 tablet 0  . Dextromethorphan-buPROPion ER (AUVELITY) 45-105 MG TBCR Take 1 tablet by mouth 2 (two) times daily. 60 tablet 3  . Esketamine HCl, 84 MG Dose, (SPRAVATO, 84 MG DOSE,) 28 MG/DEVICE SOPK USE 3 SPRAYS IN EACH NOSTRIL ONCE A WEEK 3 each 2  . iron polysaccharides (NIFEREX) 150 MG capsule TAKE 1 CAPSULE BY MOUTH EVERY DAY 90 capsule 1  . LORazepam (ATIVAN) 1 MG tablet Take 1 tablet (1 mg total) by mouth every 8 (eight) hours as needed. for anxiety 90 tablet 1  . losartan (COZAAR) 50 MG tablet Take 50 mg by mouth daily.    . nebivolol (BYSTOLIC) 2.5 MG tablet Take 2.5 mg by mouth daily.    . nortriptyline (PAMELOR) 25 MG capsule Take 2 capsules (50 mg total) by mouth at bedtime. 180 capsule 0  . pramipexole (MIRAPEX) 0.5 MG tablet Take 1 tablet (0.5 mg total) by mouth 3 (three) times daily. 90 tablet 1  . traZODone (DESYREL) 50 MG tablet TAKE 1-2 TABLETS BY MOUTH NIGHTLY AS NEEDED FOR SLEEP 180 tablet 1   No current facility-administered medications for this visit.    Medication Side Effects: None  Allergies:  Allergies  Allergen Reactions  .  Metronidazole Shortness Of Breath and Other (See Comments)    Heart pounding  . Ferrlecit [Na Ferric Gluc Cplx In Sucrose] Other (See Comments)    Infusion reaction 05/12/2019    Past Medical History:  Diagnosis Date  . ADHD   . Anemia   . Anxiety   . Arthritis   . Depression   . Heart murmur    i  went to see a cardiologit slast eyar  and i had zero plaque,   . PONV (postoperative nausea and vomiting)   . Recovering alcoholic in remission [facility])     Past Medical History, Surgical history, Social history, and Family history were reviewed and updated as appropriate.   Please see review of systems for further details on the patient's review from today.   Objective:   Physical Exam:  There were no vitals taken for this visit.  Physical Exam Psychiatric:        Attention and Perception: Attention and perception normal.        Mood and Affect: Mood and affect normal.        Speech: Speech normal.        Behavior: Behavior normal. Behavior is cooperative.        Cognition and Memory: Cognition and memory normal.        Judgment: Judgment normal.    Lab Review:     Component Value Date/Time   NA 137 01/12/2021 1430   NA 140 11/18/2018 1544   K 3.8 01/12/2021 1430   CL 108 01/12/2021 1430   CO2 22 01/12/2021 1430   GLUCOSE 94 01/12/2021 1430   BUN 14 01/12/2021 1430   BUN 20 11/18/2018 1544   CREATININE 0.82 01/12/2021 1430   CALCIUM 8.9 01/12/2021 1430   PROT 6.6 01/12/2021 1430   ALBUMIN 3.9 01/12/2021 1430   AST 12 (L) 01/12/2021 1430   ALT 11 01/12/2021 1430   ALKPHOS 46 01/12/2021 1430   BILITOT 0.5 01/12/2021 1430   GFRNONAA >60 01/12/2021 1430   GFRAA >60 09/02/2019 0249   GFRAA >60 01/27/2019 0811       Component Value Date/Time   WBC 4.5 01/12/2021 1430   RBC 4.32 01/12/2021 1430   HGB 12.8 01/12/2021 1430   HGB 12.9 07/17/2019 0953   HCT 38.5 01/12/2021 1430   HCT 21.9 (L) 12/25/2018 1221   PLT 272 01/12/2021 1430   PLT 286 07/17/2019 0953   MCV 89.1 01/12/2021 1430   MCH 29.6 01/12/2021 1430   MCHC 33.2 01/12/2021 1430   RDW 12.4 01/12/2021 1430   LYMPHSABS 1.4 01/12/2021 1430   MONOABS 0.4 01/12/2021 1430   EOSABS 0.0 01/12/2021 1430   BASOSABS 0.0 01/12/2021 1430    No results found for: "POCLITH", "LITHIUM"   No results found for:  "PHENYTOIN", "PHENOBARB", "VALPROATE", "CBMZ"   .res Assessment: Plan:    Continue Spravato treatment.   Patient advised to contact office with any questions, adverse effects, or acute worsening in signs and symptoms.     Arlys John A. Santanna Whitford, NP      Diagnoses and all orders for this visit:  Recurrent major depression resistant to treatment [facility])     Please see After Visit Summary for patient specific instructions.  No future appointments.  No orders of the defined types were placed in this encounter.   -------------------------------

## 2023-05-07 ENCOUNTER — Ambulatory Visit: Payer: 59

## 2023-05-07 ENCOUNTER — Ambulatory Visit (INDEPENDENT_AMBULATORY_CARE_PROVIDER_SITE_OTHER): Payer: 59 | Admitting: Psychiatry

## 2023-05-07 VITALS — BP 98/65 | HR 63

## 2023-05-07 DIAGNOSIS — F339 Major depressive disorder, recurrent, unspecified: Secondary | ICD-10-CM | POA: Diagnosis not present

## 2023-05-07 DIAGNOSIS — F411 Generalized anxiety disorder: Secondary | ICD-10-CM | POA: Diagnosis not present

## 2023-05-07 DIAGNOSIS — F5105 Insomnia due to other mental disorder: Secondary | ICD-10-CM

## 2023-05-07 DIAGNOSIS — F900 Attention-deficit hyperactivity disorder, predominantly inattentive type: Secondary | ICD-10-CM

## 2023-05-07 MED ORDER — AMPHETAMINE-DEXTROAMPHET ER 20 MG PO CP24
20.0000 mg | ORAL_CAPSULE | ORAL | 0 refills | Status: DC
Start: 2023-06-04 — End: 2023-07-31

## 2023-05-07 MED ORDER — AMPHETAMINE-DEXTROAMPHET ER 20 MG PO CP24
20.0000 mg | ORAL_CAPSULE | ORAL | 0 refills | Status: DC
Start: 2023-05-07 — End: 2023-06-23

## 2023-05-07 NOTE — Progress Notes (Signed)
Laura Chang [id number] 05-Oct-1956 67 y.o.  Subjective:   Patient ID:  Laura Chang is a 67 y.o. (DOB 09-01-1956) female.  Chief Complaint:  Chief Complaint  Patient presents with   Follow-up   Depression   Anxiety   ADD      Laura Chang presents to the office today for follow-up of depression and anxiety and ADD.  seen November 12, 2019.  Melted down in 2020.  Went to [employer] in July.  No withdrawal.  1 drink since then.  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 She was on no stimulant and no SSRI but was taking Strattera and Wellbutrin.  The following changes were made. Stop Strattera. OK restart stimulant bc severe ADD Restart Adderall 1 daily for a few days and if tolerated then restart 1 twice daily. If not tolerated reduce the dosage if needed. May need to stop Wellbutrin if not tolerating the stimulant.  Yes.  DC Wellbutrin Restart Prozac 20 mg daily.  February 2021 appointment with the following noted: Completed grant proposal.  Couldn't doit without Adderall.  Sold a bunch of work.   Adderall XR lasts about 3 pm.  Strength seems about right.  BP been OK.  Not jittery.   Stopped Wellbutrin but had no SE. Mood drastically better with grant proposal and back on fluoxetine.  Less depressed and lethargic.  No anxiety.  Cut back on coffee. Started back with devotions and stronger faith. Plan: Continue Prozac 20 mg daily. May have to increase the dose at some point in the future given that she usually was taking higher dosages but she is getting good response at this time. Restart Wellbutrin off label for ADD since can't get 2 ADDERALL daily. 150 mg daily then 300 mg daily. She can adjust the dose between 150 mg and 300 mg daily to get the optimal effect.   05/11/2020 appointment with the following noted: Has been inconsistent with Prozac and Wellbutrin. Not sure of the effect of Wellbutrin. Biggest deterrent in work is anxiety.   Some of the work is conceptual and difficult at times.  Can feel she's not up to a project at times.  Overall is OK but would like a steadier benefit from stimulant.  Exhausted from managing concentration and keeping up with things from the day.  Loses things.  Not good keeping up with schedule. Overall productive and emotionally OK. Can feel Adderall wear off. Mood is better in summer and worse in the winter.   F died in 10-15-2023 and that is a loss. No SE Wellbutrin. Still attends AA meetings.  Real benefit from [facility] last year. Recognizes effect of anemia on ADD and mood.  Had iron infusions last winter. Plan:  Wellbutrin off label for ADD since can't get 2 ADDERALL daily. 150 mg daily then 300 mg daily.  01/24/2021 appointment with following noted: Doing a program called [employer] mindfulness app since Xmas.  CBT app helped the depression.  App helped her focus better.  Lost sign weight. Writing a lot. Before Xmas felt depressed and started negative thinking worse, self denigrating. Not drinking. More isolated.   Recognizes mo is narcissist.    Didn't tell anyone she was born until 3 mos later.  M aloof and uninterested in pt.  Lied about her birthday.  Mo lack of affection even with pt's kids. Going to [place] for a year and it helped her to quit drinking. Also misses kids being gone  with a hole also. Plan: No med changes  05/04/2021 appointment with the following noted: Therapist [name] thinks she's manic. Lost weight to 144#.   States she is still sleeping okay.  Admits she is hyper and recognizes that she is likely manic.  She feels great, euphoric with an increased sense of spiritual connectedness to God.  She has racing thoughts and talks fast and talks a lot and this is noted by her husband.  He thinks she is a bit hyper.  She has been able to maintain sobriety although she will have 1 glass of wine on special occasions but does not drink by herself.  She is not drinking to  excess.  She denies any dangerous impulsivity.  She is clearly not depressed and not particularly anxious.  She has no concerns about her medication and she has been compliant.  06/16/21 appt noted: So much better.  Going through a lot but the manic thing happened on top of it.  So much slower.  Didn't feel like losing anything with risperidone.  Likes the Adderalll at 10 mg. Some drowsiness in the AM and very drowsy from risperidone 2 mg HS. Prayer life is better. Handling stress better. Less depressed with risperidone. Still likes trazodone. Sleeps well. Plan: Reduce Prozac to 10 mg daily.  Consider stopping it because it can feel the mania however she is reluctant to do that because she fears relapse of depression. Reduce risperidone to 1.5 mg nightly due to side effects.  Discussed risk of worsening mania.  07/25/2021 appointment with the following noted: Misses the Adderall and hard to function without it. Depressed now. Heavy chest.  Anxious and guilty.  Body feels heavy.   Hates Wellbutrin.   Plan: Increase fluoxetine to 20 mg daily Add Abilify 1/2 of 15 mg tablet daily Wean wellbutrin by 1 tablet each week  bc she feels it is not helpful and DT polypharmacy Reduce risperidone to 1 daily for 1 week and stop it. Disc risk of mania. Increase Adderall to XR 20 mg AM  08/08/21 Much less depressed and starting to feel normal I feel a lot better. No SE.  Speech normal off risperidone. Sleeping OK on trazaodone and enough.   Noticed benefit from Adderall again. Plan: continue fluoxetine to 20 mg daily Continue Abilify 1/2 of 15 mg tablet daily for depression and mania continue Adderall to XR 20 mg AM  10/10/2021 phone call: Pt stated she feels like the Abilify should be decreased to 23m.She said she is depressed but rational and not suicidal.She has an appt Monday and can wait until then if you prefer. MD response: Reduce the Abilify to 7.5 mg every other day.  We will meet on 10/16/2021  and decide what to do from there.  10/16/2021 appointment with the following noted: More depressed.  Most depressed I've ever been.  Just numb.  Sense of grief.   Thinks the manic episode was unlike anything else she ever had.  Doesn't want to medicate against it.  Don't enjoy people.  Easily overwhelmed.  Had some death thoughts but not suicidal.  Has been functional.  Feels better today after reducing Abilify to every other day but she is only been doing that for 3 days. A/P: Episode of post manic depression was explained. continue fluoxetine to 20 mg daily Hold Abilify for 1 week then resume Abilify 1/2 of 15 mg tablet every other day for depression and mania continue Adderall to XR 20 mg AM  10/27/2021  appointment with the following noted: I'm doing so much better.  Handling the depression better. Better self talk and spiritual focus has helped.   Dep 6/10 manifesting as anxiety with low confidence.   F died 2  years ago and M 67 yo and is dependent . She is working hard to feel better but still feels depressed.  She almost feels like she has a little more anxiety since restarting Abilify every other day. Plan: continue fluoxetine to 20 mg daily DC Abilify .  Vrayalar 1.5 mg QOD to try to get rid of depression ASAP. continue Adderall to XR 20 mg AM  11/10/2021 appointment with the following noted: Busy with Xmas and it was fun with family but then a big let down.  Did well with it.  Functioned well with it.  Working hard on things with depression.  Not shutting down. Not sure but feels better today but yesterday was hard.  Difficulty dealing with mother.  She won't do anything to help herself.  Yesterday with her all day.  Won't do PT and has isolated herself.    Lack of confidence.   No SE with Vraylar.  11/24/21 urgent appointment appt noted: More and more depressed.   So anxious and doesn't want to be alone but can do so. No appetite. Hurts inside. Has had some fleeting suicidal  thoughts but would not act on them.  Tolerating meds. Has been consistent with Vraylar 1.5 mg every other day, fluoxetine 20 mg daily Plan: Increase Vraylar to 1.5 mg daily Change Prozac to Trintellix 10 mg daily. Discussed side effects of each continue Adderall to XR 20 mg AM  12/27/2021 appointment with the following noted: Not OK.  I feel less depressed but feels bat shit. Not sleeping well.  Extremely anxious. Off and on sleep. 3-4 hours of sleep.   Still having daily SI.  But also become obvious has so much to do.  Overwhelmed by tasks.   Needs anxiety meds to just function. Not more motivated.  Walked yesterday.   Feels afraid like in trouble but not irritable or angry. DC DT agitation Vraylar to 1.5 mg daily Change Prozac to Trintellix 10 mg daily. Hold Adderall to XR 20 mg AM Clonidine 0.1 1/2 tablet twice daily for 2 days and if needed for anxiety and sleep increase to 1 twice daily Ok temporary Ativan 1 mg 3 times daily as needed anxiety  01/05/22 appt noted: Off fluoxetine and  Trintellix.  Only on Ativan, trazodone and Adderall XR 20 plus added clonidine 0.1 mg BID Didn't think she needed to start Trintellix. Not taking Ativan.   Didn't like herself last week. Feels some better today. Wonders if the manic sx Not agitated.  Anxiety kind of calmed down.  A lot to be anxious about situationally.  $ stress. Concerns about downers with meds. Can't access normal personality. ? Lethargy and inability to talk as sE. Plan: Latuda 20-40 mg daily with food. Adderall to XR 20 mg AM Clonidine 0.1 1/2 tablet twice daily  reduce dose to be sure no SE Ok temporary Ativan 1 mg 3 times daily as needed anxiety  01/19/22 appt noted: Taking Latuda 20 mg daily.  Took 40 mg once and felt anxious and  SI Still depressed and not very reactive Anxiety mainly about the depression and fears of the future. She wants to revisit manic sx and thinks it was maybe bc taking delta 8 bc was taking a lot  of it so still  doesn't think she's classic bipolar. She wants to only take Prozac bc thinks Latuda is perpetuating depression. Says the delta 8 was very psychaedelic.  When not taking it was not manic.  Sleeping ok again.  Plan: Per her request DC Latuda 20-40 mg daily with food. She wants to continue Prozac alone AMA  Adderall to XR 20 mg AM Clonidine 0.1 1/2 tablet twice daily  reduce dose to be sure no SE Ok temporary Ativan 1 mg 3 times daily as needed anxiety  01/23/2022 phone call complaining of increased anxiety since stopping Latuda.  She will try increasing clonidine.  01/26/2022 phone call not feeling well and wanted to restart the Vraylar.  However notes indicate that had made her agitated therefore she was encouraged to pick up samples of Rexulti 1 mg and start that instead.  02/06/2022 phone call: Stating she felt the Rexulti was helping with depression but she was not sleeping well and obsessing over things.  She was encouraged to increase Rexulti to 2 mg daily and increase trazodone for sleep.  02/09/2022 appointment with the following noted: This was an urgent work in appointment No sleep last night with trazodone 100 mg HS Nothing really better depression or anxiety. Ruminating negative anxious thoughts. Did not tolerate Rexulti because it was causing insomnia.  Does not think it helped depression.  Lacks emotion that she should have.  Lacks her usual personality.  Some hopeless thoughts.  Some death thoughts.  Some suicidal thoughts without plan or intent Plan: DC Rexulti and Prozac & DC trazodone Adderall to XR 20 mg AM Clonidine 0.1 1/tablet twice daily  reduce dose to be sure no SE Ok temporary Ativan 1 mg 3 times daily as needed anxiety Start Seroquel XR 150 mg nightly  03/02/2022 appointment: Langley Gauss called back a few days after starting Seroquel stating it was making her more anxious and more depressed.  This seemed unlikely as this medicine rarely ever causes anxiety.   She stopped the medication waited 3 days and called back still had anxiety and depression but thought perhaps the anxiety was a little better.  She did not want to take the Seroquel. She knew about the option of Spravato and wanted to pursue that. Now questions whether to return to Seroquel while waiting to start Spravato bc feels just as bad without it and knows she didn't give it enough time to work.   MADRS 46  ECT-MADRS    Flowsheet Row Clinical Support from 01/08/2023 in [place] from 08/06/2022 in [place] from 07/04/2022 in [place] from 05/21/2022 in [place] Office Visit from 03/02/2022 in [facility]  MADRS Total Score '21 29 15 27 '$ 46      03/14/22 appt noted: Pt received Spravato 56 mg first dose today with some dissociative sx which were not severe.  She was anxious prior to the administration and felt better after receiving lorazepam 1 mg.  No NV, or HA. Wants to continue Spravato. Ongoing depression and desperate to feel better.  I'm not myself DT deprsssion which is most severe in recent history.  Anhedonia.  Low motivation.  Social avoidance. Continues to think all recent med trials are making her worse.  Sleep ok with Seroquel.  03/16/22 appt noted: Received Spravato 84 mg for the first time.  some dissociative sx which were not severe.  She was anxious prior to  the administration and felt better after receiving lorazepam 1 mg.  No NV, or HA. Wants to continue Spravato.   Does not feel any better or different since the last appt.  Ongoing depression.  Ongoing depression and desperate to feel better.  I'm not myself DT deprsssion which is most severe in recent history.  Anhedonia.  Low motivation.  Social avoidance. Continues to think all recent med trials are making her worse.  Sleep  ok with Seroquel.  Does not want to continue Seroquel for TRD.  03/20/2022 appointment noted: Came for Spravato administration today.  However blood pressure was significantly elevated approximately 180/115.  She was given lorazepam 1 mg and clonidine 0.2 mg to try to get it down. She states she regretted stopping the Seroquel XR 300 mg tablets.  She now realizes it was helpful.  She did not sleep much at all last night.  She did not take the Adderall this morning. 2 to 3 hours after arrival blood pressure was still elevated at  170/110, 62 pulse.  For Spravato administration was canceled for today.  She admits to being anxious and depressed.  She is not suicidal.  She is highly motivated to receive the Spravato.  We discussed getting it tomorrow.  03/22/2022 appointment noted: Patient's blood pressure was never stable enough yesterday in order to get her in for Spravato administration.  She was encouraged to see her primary care doctor.  It is better today.  03/26/2022 appointment with the following noted: Blood pressure was better.  Saw her primary care doctor who started on oral Bystolic 2.5 mg daily. Received Spravato 84 mg today as scheduled.  Tolerated it well without nausea or vomiting headache or chest pain or palpitations.  Her blood pressure was borderline but manageable. She remains depressed and anxious.  She is ambivalent about the medicine and desperate to get to feel better.  Continues to have anhedonia and low energy and low motivation and reduced ability to do things.  Less social.  Not suicidal.  03/28/22 appt noted: Received Spravato 84 mg today as scheduled.  Tolerated it well without nausea or vomiting headache or chest pain or palpitations.  Her blood pressure was borderline but manageable. Has not seen any improvement so far.  Tolerating Seroquel.  Inconsistent with Bystolic and BP has been borderline high. Still depressed and anxious and anhedonia.  Low motivation, energy,  productivity. Taking quetiapine and tolerating XR 300 mg nightly.  04/04/22 appt noted: Received Spravato 84 mg today as scheduled.  Tolerated it well without nausea or vomiting headache or chest pain or palpitations.  Her blood pressure was borderline but manageable. Has not seen any improvement so far.  Tolerating Seroquel.   She still tends to think that the medications are making her worse.  She has said this about each of the recent psychiatric medicines including Seroquel.  However her husband thinks she is improved.  She also admits there is some improvement in productivity.  She still feels highly anxious.  She still does not enjoy things as normal.  She still feels desperate to improve as soon as possible. Has been taking Seroquel XR since 03/20/2022  04/10/22 appt noted: Received Spravato 84 mg today as scheduled.  Tolerated it well without nausea or vomiting headache or chest pain or palpitations.  Her blood pressure was borderline but manageable. Has not seen any improvement so far.  Tolerating Seroquel.  Doesn't like Seroquel bc she thinks it flattens here. 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  Tolerated it well without nausea or vomiting headache or chest pain or palpitations.  Her blood pressure was borderline but manageable. Has not seen any improvement so far.  Tolerating Seroquel.  Doesn't like Seroquel bc she thinks it flattens her. Received Spravato 84 mg today as scheduled.  Tolerated it well without nausea or vomiting headache or chest pain or palpitations.  Her blood pressure was borderline but manageable. Has not seen any improvement so far.  Tolerating Seroquel.  Doesn't like Seroquel bc she thinks it flattens here.  We discussed her ambivalence about it. She is starting Auvelity and has tolerated it  the last 2 days without side effect.  She still does not feel like herself and feels flat and not enjoying things with suppressed expressed emotion  04/17/2022 appointment with the following noted: Received Spravato 84 mg today as scheduled.  Tolerated it well without nausea or vomiting headache or chest pain or palpitations.  Her blood pressure was borderline but manageable. Has not seen any improvement so far.  Tolerating Seroquel.  Doesn't like Seroquel bc she thinks it flattens her. She has been tolerating the Auvelity 1 in the morning without side effects for about a week.  She has not noticed significant improvement so far.  She still feels depressed and flat and not herself.  Other people notice that she is flat emotionally.  She is not suicidal.  She does feel discouraged that she is not getting better yet.  04/19/2022 appointment noted: Has increased Auvelity to 1 twice daily for 2 days, continues quetiapine XR 300 mg nightly, clonidine 0.3 mg twice daily, lorazepam 1 mg twice daily for anxiety and Adderall XR 20 mg in the morning. No obious SE but she still thinks quetiapine XR is making her feel down.  But not sedated Received Spravato 84 mg today as scheduled.  Tolerated it well without nausea or vomiting headache or chest pain or palpitations.  Her blood pressure was borderline but manageable. She still feels quite anxious and feels it necessary to take both the clonidine and lorazepam twice a day to manage her anxiety.  She has been consistently down and flat and not herself until yesterday afternoon she noted an improvement in mood and feeling much more like herself with her normal personality reemerging.  She was quite depressed in the morning with very dark negative thoughts.  She did not have those dark negative thoughts this morning.  She had a lot of questions about medication and when she was expecting to be improved and why she has not shown improvement up to now.  04/23/22 appt  noted: Has increased Auvelity to 1 twice daily for 1 week, continues quetiapine XR 300 mg nightly, clonidine 0.3 mg twice daily, lorazepam 1 mg twice daily for anxiety and Adderall XR 20 mg in the morning. No obious SE but she still thinks quetiapine XR is making her feel down.  But not sedated Received Spravato 84 mg today as scheduled.  Tolerated it well without nausea or vomiting headache or chest pain or palpitations.  She is still depressed but admits better function and is able to enjoy social interactions. Tolerating meds.  Would like to feel better for sure. Not herself.  Flat. Plan increase Auvelity to 1 tab BID as planned and reduce Quetiapine to 1/2 of ER 300 mg  bc NR for depression.  04/25/2022 appointment with the following noted: clonidine 0.3 mg twice daily, lorazepam 1 mg twice daily for anxiety and Adderall XR 20 mg in the morning. Seroquel XR 300 HS No obious SE but she still thinks quetiapine XR is making her feel down.  But not sedated Received Spravato 84 mg today as scheduled.  Tolerated it well without nausea or vomiting headache or chest pain or palpitations.  Called yesterday with more anxiety.  Had increased Auvelity for 1 day and reduced Seroquel XR for 1 day.  Felt restless and fearful  05/01/2022 appointment noted: clonidine 0.3 mg twice daily, lorazepam 1 mg twice daily for anxiety and Adderall XR 20 mg in the morning. Seroquel XR 150 HS, Auvelity 1 BID Received Spravato 84 mg today as scheduled.  Tolerated it well without nausea or vomiting headache or chest pain or palpitations.  Nurse has noted patient has called multiple times sometimes asking the same question repeatedly.  It is unclear whether she is truly forgetful or is just anxious seeking reassurance. Patient acknowledges ongoing depression as well as some anxiety but states she has felt a little better in the last couple of days.  She has reduced the Seroquel to 150 mg at night and has increased Auvelity to 1  twice daily but only for 1 day.  So far she seems to be tolerating it.  05/03/22 appt noted: clonidine 0.2 mg twice daily, lorazepam 1 mg twice daily for anxiety and Adderall XR 20 mg in the morning. Seroquel XR 150 HS, Auvelity 1 BID BP high this am about 170/100 and received extra clonidine 0.2 mg and came to receive Spravato.  Not dizzy, no SOB, nor CP but BP is still high Could not receive Spravato today bc BP high and pulse low at 30 ppm. Still depressed and anxious. Plan: continue trial Auvelity with Spravato She needs to get BP and pulse managed  05/08/22 TC: RTC  H Michael NA and mailbox full.  Could not leave message.  Pt  -  talked to she and H on speaker. H worried over wife.  Vacant stare.  Slurs words at times.  Not smiling. Reduced enjoyment.  Depression.  Withdrawn from usual activities.  Some irritability.  Anxious. Disc her concerns meds are making her worse.  Extensive discussion about her treatment resistant status.  There is a consistent pattern of not taking the medicines long enough to get benefit because she believes the meds are making her worse.  However the symptoms she describes as side effects are exactly the same symptoms that she had prior to taking the medication RX for  the depression.  So it is not clear that these are actual side effects. This is true about the 2 most recent meds including Seroquel and Auvelity.  Recommend psychiatric consultation in hopes of improving her comfort level with taking prescribed medications for a sufficient length of time to provide benefit. Extensive discussion about ECT is the treatment of choice for treatment resistant depression.  Spravato may work if she can comply with consistency.  There are medication options but they take longer to work.   Plan:  Reduce clonidine to 0.1 mg BID DT bradycardia.  Talk with PCP about BP and low pulse problems which are interfering with her consistent compliance with Spravato.   Limit lorazepam to  3 -4  mg daily max. Excess use is the cause of slurring speech.  She must stop excess use or will have to stop the med. Stop Auvelity per  her request.  But she has only been on the full dose for a little over a week and clearly has not had time to get benefit from it.  She thinks maybe it is making her more anxious. Reduce Seroquel from 150XR to 50 -100 mg at night IR.  She couldn't sleep when stopped it completely. Will not start new antidepressant until her SE issues are resolved or not. Get second psych opinion from Yehuda Budd MD or another psychiatrist.  H's sister is therapist in [place], MD, [facility]  05/16/2022 appointment with the following noted: Received Spravato 84 mg today as scheduled.  Tolerated it well without nausea or vomiting headache or chest pain or palpitations.  She stopped Auvelity as discussed last week. On her own, without physician input, she restarted Wellbutrin XL 450 mg every morning today.  She had taken it in the past.  She feels jittery and anxious. She feels less depressed than she did last week.  But she is still depressed without her usual range of affect.  She still is less social and less motivated than normal. Her primary care doctor increased the dose of losartan Plan: Stop Seroquel Reduce Wellbutrin XL to 300 mg every morning.  Starting the dose at 450 every morning is likely causing side effects of jitteriness and it should not be started at that have a dosage. Recommend she not change meds on her own without MDM put  05/23/2022 appointment with the following noted: Received Spravato 84 mg today as scheduled.  Tolerated it well without nausea or vomiting headache or chest pain or palpitations.  Has not dropped seroquel XR 300 mg 1/2 tablet nightly bc couldn't sleep without it. Has not tried lower dose quetiapine 50 mg HS Still feels depressed.   BP is better managed so far, just saw PCP.  BP is better today and infact is low today. Dropped  clonidine as directed from 0.3 mg BID bc inadequate control of BP to 0.2 mg BID.  However she wants to increase it back to 0.3 mg twice daily because she feels it helped her anxiety better.  Wonders about increasing Wellbutrin for depression.  However she has only been on 300 mg a day for a week.  She was on 450 mg daily in the past.  06/06/22 appt noted: Received Spravato 84 mg today as scheduled.  Tolerated it well without nausea or vomiting headache or chest pain or palpitations.  She is still depressed and anxious.  She wants to try to stop the Seroquel but cannot sleep without some of it.  She is taking lorazepam 1 mg 4 times daily and still having a lot of anxiety.  She wants to increase clonidine back to 0.3 mg twice daily.  She hopes for more improvement She recently went for a second psychiatric opinion as suggested the results of that are pending.  06/11/22 appt noted: Received Spravato 84 mg today as scheduled.  Tolerated it well without nausea or vomiting headache or chest pain or palpitations.  She is still depressed and anxious. Without much change.  Still hopeless, anhedonia, reduced inteterest and motivation.  Tolerating meds. Disc concerns Spravato is not hleping much. Plan: stop Seroquel and start olanzapine 10 mg HS for TRD and anxiety.  06/13/2022 appointment noted: Received Spravato 84 mg today as scheduled.  Tolerated it well without nausea or vomiting headache or chest pain or palpitations.  She is still depressed and anxious. Without much change.  Still hopeless, anhedonia, reduced inteterest  and motivation.  Tolerating meds. Disc concerns Spravato is not helping much as hoped but is improving a bit in the last week. Tolerating meds. Continues Wellbutrin XL 450 AM, tolerating recently started olanzapine  10 mg HS. Sleep is good.   Pending appt with [place] consult.  06/18/22 appt noted: Received Spravato 84 mg today as scheduled.  Tolerated it well without nausea or vomiting  headache or chest pain or palpitations.  Tolerating meds. Continues Wellbutrin XL 450 AM, tolerating recently started olanzapine  10 mg HS. Continues Adderall XR 20 amd and has tried to reduce lorazepam to '1mg'$  TID Sleep is good.   Pending appt with [place] consult. Depression is a little bit better in the last week with a little improvement in emotional expression and interest.  She is pushing herself to be more active.  Her daughter thought she was a little better than she has been.  However she is still depressed and still not her normal self with anhedonia and reduced emotional expressiveness.  06/20/22 appt noted: Received Spravato 84 mg today as scheduled.  Tolerated it well without nausea or vomiting headache or chest pain or palpitations.  Tolerating meds with a little sleepiness. Continues Wellbutrin XL 450 AM, tolerating recently started olanzapine  10 mg HS. Continues Adderall XR 20 amd and has tried to reduce lorazepam to '1mg'$  TID Sleep is good.   Mood is improving.  Better funciton.  Anxiety is better with olanzapine. Still not herself and depression not gone with some anhedonia and social avoidance and feeling overwhelmed.  8/14 2023 received Spravato 84 mg 06/27/2022 received Spravato Spravato 84 mg 07/02/2022 received Spravato 84 mg 07/04/2022 received Spravato 84 mg  07/09/2022 appointment noted: Received Spravato 84 mg today as scheduled.  Tolerated it well without nausea or vomiting headache or chest pain or palpitations.  Expected dissociation and feels less depressed with resolution of negative emotions immediately after Spravato and then depression, anxiety creep back in. Continues meds Adderall XR 20 mg every morning, Wellbutrin XL 450 every morning, clonidine 0.1 mg twice daily, lorazepam 1 mg every 6 hours as needed, olanzapine increased from 7.5 to 10 mg nightly on  Tolerating meds.  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  She does note some increased appetite and is somewhat concerned about that but has not gained significant amounts of weight. She has had the [place] consultation which was initially denied but she knows it can be appealed.  However because she is improving with Spravato plus the other medications now she wants to continue the current treatment plan.  07/18/22 appt noted: Continues meds Adderall XR 20 mg every morning, Wellbutrin XL 450 every morning, clonidine 0.1 mg twice daily, lorazepam 1 mg every 6 hours as needed, olanzapine increased from 7.5 to 10 mg nightly on 07/04/2022. Received Spravato 84 mg today as scheduled.  Tolerated it well without nausea or vomiting headache or chest pain or palpitations.  Expected dissociation and feels less depressed with resolution of negative emotions immediately after Spravato and then depression, anxiety creep back in. Continues meds Adderall XR 20 mg every morning, Wellbutrin XL 450 every morning, clonidine 0.1 mg twice daily, lorazepam 1 mg every 6 hours as needed, olanzapine increased from 7.5 to 10 mg nightly on  Tolerating meds.  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  She does note some increased appetite and is somewhat concerned about that but has not gained significant amounts of weight. She has had the [place] consultation which was initially denied but she knows it can be appealed. She continues to have chronic ambivalence about psychiatric medicines and initially tends to blame her depressive symptoms such as decreased concentration and feeling flat on what ever medicine she currently is taking even though she had the same symptoms before the current medicines were started.  Then after discussion she does admit that her depressive symptoms are improved since adding  olanzapine but still has those residual symptoms noted.  07/23/22 received Spravato 84 mg   07/30/2022 appointment noted: Received Spravato 84 mg today as scheduled.  Tolerated it well without nausea or vomiting headache or chest pain or palpitations.  Expected dissociation and feels less depressed with resolution of negative emotions immediately after Spravato and then depression, anxiety creep back in. Continues meds Adderall XR 20 mg every morning, Wellbutrin XL 450 every morning, clonidine 0.1 mg twice daily, lorazepam 1 mg every 6 hours as needed, olanzapine increased from 7.5 to 10 mg nightly on  She has been inconsistent with olanzapine because she continues to be ambivalent about the medications in general and thinks that perhaps the 10 mg is making her feel blunted.  She continues to feel some depression.  She had a good day this week and but still feels somewhat depressed and persistently anxious. Plan: be consistent with olanzapine 10 mg HS for TRD and longer trial for potential benefit for anxiety.  Has not taken it consistently.  08/06/2022 appointment noted: Received Spravato 84 mg today as scheduled.  Tolerated it well without nausea or vomiting headache or chest pain or palpitations.  Expected dissociation and feels less depressed with resolution of negative emotions immediately after Spravato and then depression, anxiety creep back in. Continues meds Adderall XR 20 mg every morning, Wellbutrin XL 450 every morning, clonidine 0.1 mg twice daily, lorazepam 1 mg every 6 hours as needed, olanzapine i 10 mg nightly  She continues to feel depressed but is about 50% better with Spravato.  She is still not herself.  She still has anhedonia.  She still is not her able to engage socially in the typical ways.  She is not jovial and outgoing like normal.  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  She continues to feel that her personality is dampened down.  There is a question about whether it is related to depression or medication. Plan: continue olanzapine 10 for longer trial for TRD and severe anxiety.  08/13/22 appt noted:  Received Spravato 84 mg today as scheduled.  Tolerated it well without nausea or vomiting headache or chest pain or palpitations.  Expected dissociation and feels less depressed with resolution of negative emotions immediately after Spravato and then depression, anxiety creep back in. Continues meds Adderall XR 20 mg every morning, Wellbutrin XL 450 every morning, clonidine 0.1 mg twice daily, lorazepam 1 mg every 6 hours as needed, olanzapine i 10 mg nightly  She still does not feel herself.  Still struggles with depression and low motivation and reduced social engagement and reduced interest and reduced emotional expression.  She is somewhat better with the medicines plus Spravato.  She still believes the Spravato makes her blunted and  is not sure how much it helps her anxiety.  She can have good days when her family is around and she is engaged.  She still wants to stop the olanzapine. She has apparently continued to take the trazodone despite having been told to stop it when she started olanzapine.  She feels like she needs the trazodone. Plan: DC olanzapine and Start nortriptyline 25 mg nightly and build up to 75 mg nightly and then check blood level.    08/27/2022 appointment noted: Received Spravato 84 mg today as scheduled.  Tolerated it well without nausea or vomiting headache or chest pain or palpitations.  Expected dissociation and feels less depressed with resolution of negative emotions immediately after Spravato and then depression, anxiety creep back in. Continues meds Adderall XR 20 mg every morning, Wellbutrin XL 450 every morning, clonidine 0.1 mg twice daily, lorazepam 1 mg every 6 hours as needed. Stopped olanzapine and started  nortriptyline which she has taken for about a week is 75 mg nightly. So far she is tolerating the nortriptyline well with the exception of some dry mouth and constipation which she is working to manage.  She does not feel substantially better better or different off the olanzapine.  No change in her sleep which is good.  Main concern currently in addition to the residual depression is anxiety which is somewhat situational with pending arch show.  She is worrying about it more than normal.  Says she is having to take lorazepam twice a day where she had been able to keep reduce it prior to this.  She still does not feel like herself with residual depression with less social interest and less of her usual buoyancy in personality.  She is flatter than normal.  Overall she still feels that the Spravato has been helpful at reducing the severity of the depression.  She is not suicidal. She has not heard anything about the [place] appeal as of yet.  09/05/2022 appointment noted: Received Spravato 84 mg today as scheduled.  Tolerated it well without nausea or vomiting headache or chest pain or palpitations.  Expected dissociation and feels less depressed with resolution of negative emotions immediately after Spravato and then depression, anxiety creep back in. Continues meds Adderall XR 20 mg every morning, Wellbutrin XL 450 every morning, clonidine 0.1 mg twice daily, lorazepam 1 mg every 6 hours as needed. Stopped olanzapine and started nortriptyline which she has taken for about 2 week is 75 mg nightly. Initially blood pressure was a little high causing delay in starting Spravato.  She admitted to feeling a little wound up.  She still experiences a little increase in depression if she goes longer than a week in between doses of Spravato.  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  09/10/22 appt noted: Received  Spravato 84 mg today as scheduled.  Tolerated it well without nausea or vomiting headache or chest pain or palpitations.  Expected dissociation gradually resolved over the 2 hour observation period. She feels 50% less depressed with Spravto and wants to continue it.   Continues meds Adderall XR 20 mg every morning, Wellbutrin XL 450 every morning, clonidine 0.1 mg twice daily, lorazepam 1 mg every 6 hours as needed. Has started nortriptyline 75 mg nightly for about 3 weeks. Has not seen a significant difference with the addition of nortriptyline.  Tolerating it pretty well. She continues  to have some degree of anhedonia and significant depression and anxiety.  Her daughters noticed that she is more needy and calls more frequently.  She acknowledges this as well.  She is clearly still not herself. Plan: pramipexole off label and RX 0.25 mg BID  09/17/2022 appointment noted: Received Spravato 84 mg today as scheduled.  Tolerated it well without nausea or vomiting headache or chest pain or palpitations.  Expected dissociation gradually resolved over the 2 hour observation period. She feels 50% less depressed with Spravto and wants to continue it.   Continues meds Adderall XR 20 mg every morning, Wellbutrin XL 450 every morning, clonidine 0.1 mg twice daily, lorazepam 1 mg every 6 hours as needed. Has started nortriptyline 75 mg nightly for about 3 weeks and DT level 176, reduced to 50 mg HS early November. Still the same sx as noted last visit.  Tolerating meds.   Compliant.  Still depressed and family notices.  Has been able to participate in family interactions.  Some post-show let down and has to do detailed work which is hard for her bc ADD.  Sleep and eating well.  Energy OK but not great.  No SI.  Not cried in a year or so.  Clearly less depressed and hopeless than before the Spravato.  09/24/22 appt noted: Received Spravato 84 mg today as scheduled.  Tolerated it well without nausea or vomiting  headache or chest pain or palpitations.  Expected dissociation gradually resolved over the 2 hour observation period. She feels 50% less depressed with Spravto and wants to continue it.   Continues meds Adderall XR 20 mg every morning, Wellbutrin XL 450 every morning, clonidine 0.1 mg twice daily, lorazepam 1 mg every 6 hours as needed. Has started nortriptyline 75 mg nightly for about 3 weeks and DT level 176, reduced to 50 mg HS early November. Still the same sx as noted last visit.  Tolerating meds.   Compliant.  Still depressed and family notices.  Has been able to participate in family interactions.  Some post-show let down and has to do detailed work which is hard for her bc ADD.  Sleep and eating well.  Energy OK but not great.  No SI.  Not cried in a year or so.  Clearly less depressed and hopeless than before the Spravato. Is not making further progress generally.  Stuck with moderate depression  10/02/22 appt noted: Received Spravato 84 mg today as scheduled.  Tolerated it well without nausea or vomiting headache or chest pain or palpitations.  Expected dissociation gradually resolved over the 2 hour observation period. She feels 50% less depressed with Spravto and wants to continue it.   Continues meds Adderall XR 20 mg every morning, Wellbutrin XL 450 every morning, clonidine 0.1 mg twice daily, lorazepam 1 mg every 6 hours as needed. Has started nortriptyline 75 mg nightly for about 3 weeks and DT level 176, reduced to 50 mg HS early November. Still the same sx as noted last visit.  Tolerating meds.   Compliant.  Still depressed and family notices.  Has been able to participate in family interactions.  Some post-show let down and has to do detailed work which is hard for her bc ADD.  Sleep and eating well.  Energy OK but not great.  No SI.  Not cried in a year or so.  Clearly less depressed and hopeless than before the Spravato. Is not making further progress generally.  Stuck with moderate  depression.  Is  able to function pretty normally. Plan: trial pramipexole 0.25 mg BID off label for depression.   10/08/22 appt noted: Received Spravato 84 mg today as scheduled.  Tolerated it well without nausea or vomiting headache or chest pain or palpitations.  Expected dissociation gradually resolved over the 2 hour observation period. She feels 50% less depressed with Spravto and wants to continue it.   Continues meds Adderall XR 20 mg every morning, Wellbutrin XL 450 every morning, clonidine 0.1 mg twice daily, lorazepam 1 mg every 6 hours as needed. Has started nortriptyline 75 mg nightly for about 3 weeks and DT level 176, reduced to 50 mg HS early November. Still the same sx as noted last visit.  Tolerating meds.   Compliant.  Still depressed and family notices.  Has been able to participate in family interactions.  Some post-show let down and has to do detailed work which is hard for her bc ADD.  Sleep and eating well.  Energy OK but not great.  No SI.  Not cried in a year or so.  Clearly less depressed and hopeless than before the Spravato. Is not making further progress generally.  Stuck with moderate depression.  Behaved and felt pretty normally with family over for Thanksgiving. Doesn't see benefit or SE with pramipexole but thinks maybe it makes her worse. Plan:  increase pramipexole augmentation off label to 0.5 mg BID  10/15/2022 appointment noted: Received Spravato 84 mg today as scheduled.  Tolerated it well without nausea or vomiting headache or chest pain or palpitations.  Expected dissociation gradually resolved over the 2 hour observation period. She feels 50% less depressed with Spravto and wants to continue it.   Continues meds Adderall XR 20 mg every morning, Wellbutrin XL 450 every morning, clonidine 0.2 mg twice daily, lorazepam 1 mg every 6 hours as needed.  Trazodone 50 mg tablets 1-2 nightly as needed insomnia Has started nortriptyline 75 mg nightly for about 3 weeks  and DT level 176, reduced to 50 mg HS early November. Recommended increase pramipexole 0.5 mg twice daily off label for treatment resistant depression on 10/08/2022. She feels better motivated more active with pramipexole 0.5 mg twice daily.  She still is depressed but it is better.  We discussed the possibility of going up in the dose but did not change it.  10/22/2022 appointment noted: Received Spravato 84 mg today as scheduled.  Tolerated it well without nausea or vomiting headache or chest pain or palpitations.  Expected dissociation gradually resolved over the 2 hour observation period. She feels 50% less depressed with Spravto and wants to continue it.   Continues meds Adderall XR 20 mg every morning, Wellbutrin XL 450 every morning, clonidine 0.2 mg twice daily, lorazepam 1 mg every 8 hours as needed.  Trazodone 50 mg tablets 1-2 nightly as needed insomnia Has started nortriptyline 75 mg nightly for about 3 weeks and DT level 176, reduced to 50 mg HS early November. pramipexole 0.5 mg twice daily off label for treatment resistant depression on 10/08/2022. She is better motivated than she was.  She is journaling 3 pages a day.  She has started walking and has walked 5 days a week for 50 minutes for the last 2 weeks.  That is significantly helped her mood.  Her mood tends to be better when she interacts with family.  However she still has some periods of depression.  She is tolerating the medications well.  She still feels like her affect and confidence is not  back to normal. Plan:  increase pramipexole augmentation off label to 0.5 mg tID or 0.75 mg BID   10/29/22 appt noted: Received Spravato 84 mg today as scheduled.  Tolerated it well without nausea or vomiting headache or chest pain or palpitations.  Expected dissociation gradually resolved over the 2 hour observation period. She feels 50% less depressed with Spravto and wants to continue it.   Continues meds Adderall XR 20 mg every  morning, Wellbutrin XL 450 every morning, clonidine 0.2 mg twice daily, lorazepam 1 mg every 8 hours as needed.  Trazodone 50 mg tablets 1-2 nightly as needed insomnia Has started nortriptyline 75 mg nightly for about 3 weeks and DT level 176, reduced to 50 mg HS early November. pramipexole increased to 0.75 mg twice daily off label for treatment resistant depression  She has been feeling somewhat better with the increase in pramipexole and is tolerating it well.  She still is easily overwhelmed.  Her affect and mood can improve now when around her family or doing something positive.  She has been able to be more productive. She is tolerating the current medications. She is still considering TMS as an alternative to Spravato.  11/15/2022 appointment noted: Received Spravato 84 mg today as scheduled.  Tolerated it well without nausea or vomiting headache or chest pain or palpitations.  Expected dissociation gradually resolved over the 2 hour observation period. She feels 50% less depressed with Spravto and wants to continue it.   Continues meds Adderall XR 20 mg every morning, Wellbutrin XL 450 every morning, clonidine 0.2 mg twice daily, lorazepam 1 mg every 8 hours as needed.  Trazodone 50 mg tablets 1-2 nightly as needed insomnia Has started nortriptyline 75 mg nightly for about 3 weeks and DT level 176, reduced to 50 mg HS early November. pramipexole increased to 0.75 mg twice daily off label for treatment resistant depression  He has noticed some increase in depression due to the length of time since the last Spravato administration.  She believes Spravato is helping her.  sHe is not suicidal but has felt very blue the last few days. She is tolerating the medication. She is ambivalent about Spravato versus [place] but she is considering [place]. She wants to continue Spravato administration because it is clearly helpful.  11/19/22 appt noted: Received Spravato 84 mg today as scheduled.  Tolerated it well  without nausea or vomiting headache or chest pain or palpitations.  Expected dissociation gradually resolved over the 2 hour observation period. She feels 50% less depressed with Spravto and wants to continue it.   Continues meds Adderall XR 20 mg every morning, Wellbutrin XL 450 every morning, clonidine 0.2 mg twice daily, lorazepam 1 mg every 8 hours as needed.  Trazodone 50 mg tablets 1-2 nightly as needed insomnia Has started nortriptyline 75 mg nightly for about 3 weeks and DT level 176, reduced to 50 mg HS early November. pramipexole increased to 0.75 mg twice daily off label for treatment resistant depression  She has felt better back on Spravato more regularly.  However she still has residual depression esp when alone or when wihtout activity.  Can function when needed.   Does not have her connfidence back. She plans to pursue [place] availability. Have discussed retrying [place]  11/28/22 appt noted: Received Spravato 84 mg today as scheduled.  Tolerated it well without nausea or vomiting headache or chest pain or palpitations.  Expected dissociation gradually resolved over the 2 hour observation period. She feels 50% less  depressed with Spravto and wants to continue it.   Continues meds Adderall XR 20 mg every morning, Wellbutrin XL 450 every morning, clonidine 0.2 mg twice daily, lorazepam 1 mg every 8 hours as needed.  Trazodone 50 mg tablets 1-2 nightly as needed insomnia Has started nortriptyline 75 mg nightly for about 3 weeks and DT level 176, reduced to 50 mg HS early November. pramipexole increased to 0.75 mg twice daily off label for treatment resistant depression  Last week she was more depressed than usual for reasons that are not clear.  She has not had a clear answer from [facility] [place] about [facility] options.  States that they have not returned her call.  She is asked about Auvelity retry in place of Wellbutrin.  Has still been walking.  12/03/22 appt noted: Received Spravato 84 mg today  as scheduled.  Tolerated it well without nausea or vomiting headache or chest pain or palpitations.  Expected dissociation gradually resolved over the 2 hour observation period. She feels 50% less depressed with Spravto and wants to continue it.   Continues meds Adderall XR 20 mg every morning, Wellbutrin XL 450 every morning, clonidine 0.2 mg twice daily, lorazepam 1 mg every 8 hours as needed.  Trazodone 50 mg tablets 1-2 nightly as needed insomnia started nortriptyline 75 mg nightly for about 3 weeks and DT level 176, reduced to 50 mg HS early November. pramipexole increased to 0.75 mg twice daily off label for treatment resistant depression  No SE .  Satisfied with meds. Depression is better in the last week.  Not sure why that is the case.  Still walking daily and that helps and journaling 3 pages daily.  Working on [employer], academic.  No changes desire.  Clearly benefits from Spravato but not 100%.  Still considering TMS.  12/10/22 appt noted: Received Spravato 84 mg today as scheduled.  Tolerated it well without nausea or vomiting headache or chest pain or palpitations.  Expected dissociation gradually resolved over the 2 hour observation period. She feels 50% less depressed with Spravto and wants to continue it.   Continues meds Adderall XR 20 mg every morning, Wellbutrin XL 450 every morning, clonidine 0.2 mg twice daily, lorazepam 1 mg every 8 hours as needed.  Trazodone 50 mg tablets 1-2 nightly as needed insomnia started nortriptyline 75 mg nightly for about 3 weeks and DT level 176, reduced to 50 mg HS early November. pramipexole increased to 0.75 mg twice daily off label for treatment resistant depression  No SE .   Depression was worse this week for no apparent reason.  She struggled being positive.  She has felt more discouraged.  She has felt more anxious.  She has had a hard time doing tasks.  She is interested in retrying the [place] which we had discussed previously. Plan: She agrees to  [facility].  To improve tolerability and reduce risk of side effects, Stop Wellbutrin and start Auvelity 1 in the morning for 1 week then 1 twice daily AndReduce pramipexole 0.5 mg BID and reduce nortriptyline to 50 mg HS  12/17/22 appt noted: Received Spravato 84 mg today as scheduled.  Tolerated it well without nausea or vomiting headache or chest pain or palpitations.  Expected dissociation gradually resolved over the 2 hour observation period. She feels 50% less depressed with Spravto and wants to continue it.   Continues meds Adderall XR 20 mg every morning, stopped Wellbutrin XL 150 every morning, clonidine 0.2 mg twice daily, lorazepam 1 mg every  8 hours as needed.  Trazodone 50 mg tablets 1-2 nightly as needed insomnia Has started nortriptyline 75 mg nightly for about 3 weeks and DT level 176, reduced to 50 mg HS early November. pramipexole 0.375 mg twice daily off label for treatment resistant depression with plan to stop Started Auvelity 1 AM Still depressed to moderate degree.  Tolerating Auvelity so far.  No other problems with meds.  Willing to give Auvelity a chance. Plan: She agrees to [facility].  Continue 1 AM and when tolerated then increase to BID Stop pramipexole.  12/26/22 received Spravato  01/01/23 appt noted: Received Spravato 84 mg today as scheduled.  Tolerated it well without nausea or vomiting headache or chest pain or palpitations.  Expected dissociation gradually resolved over the 2 hour observation period. She feels 50% less depressed with Spravto and wants to continue it.   Continues meds Adderall XR 20 mg every morning, stopped Wellbutrin XL 150 every morning, clonidine 0.2 mg twice daily, lorazepam 1 mg every 8 hours as needed.  Trazodone 50 mg tablets 1-2 nightly as needed insomnia Has started nortriptyline 75 mg nightly for about 3 weeks and DT level 176, reduced to 50 mg HS early November. Forgot to reduce pramipexole stoll taking  0.5 mg twice daily off  label for treatment resistant depression  Started Auvelity 1 AM & PM last week. Occ misses Spravato DT htn.this week a better than last.   Painting again more and it helps mood.  Confidence still low and not as likely to socialize as normal but enjoys family.   Still ambivalent about [place]. Tolerating meds.  Including the increase in [place].    01/08/23 appt noted: Received Spravato 84 mg today as scheduled.  Tolerated it well without nausea or vomiting headache or chest pain or palpitations.  Expected dissociation gradually resolved over the 2 hour observation period. She feels 50% less depressed with Spravto and wants to continue it.   Continues meds Adderall XR 20 mg every morning, stopped Wellbutrin XL 150 every morning, clonidine 0.2 mg twice daily, lorazepam 1 mg every 8 hours as needed.  Trazodone 50 mg tablets 1-2 nightly as needed insomnia Has started nortriptyline 75 mg nightly for about 3 weeks and DT level 176, reduced to 50 mg HS early November. reduced pramipexole to 0.5 mg daily off label for treatment resistant depression  Started Auvelity 1 AM & PM mid Feb. More depressed as week progresses.  Thinks it is worse with less pramipexole.  More negative.  Able to function but feels miserable.  No SI.  Hopeless.  Sleep ok.  Tolerating meds.   Talked to [facility] about [place] and appt mid March. Plan: increase pramipexole to 0.5 mg TID for dep bc seemed to worsen with the reduction.  01/15/23 appt noted: Received Spravato 84 mg today as scheduled.  Tolerated it well without nausea or vomiting headache or chest pain or palpitations.  Expected dissociation gradually resolved over the 2 hour observation period. She feels 50% less depressed with Spravto and wants to continue it.   Continues meds Adderall XR 20 mg every morning, stopped Wellbutrin XL 150 every morning, clonidine 0.2 mg twice daily, lorazepam 1 mg every 8 hours as needed.  Trazodone 50 mg tablets 1-2 nightly as needed  insomnia Has started nortriptyline 75 mg nightly for about 3 weeks and DT level 176, reduced to 50 mg HS early November. Increased pramipexole to 0.5 mg  to TID  off label for treatment resistant depression  Started Auvelity 1 AM & PM mid Feb. markedly better depression the increase in pramipexole.  She feels like her depression is almost resolved.  She is very pleased with the response.  She is tolerating the medications well  01/30/23 appt noted: Received Spravato 84 mg today as scheduled.  Tolerated it well without nausea or vomiting headache or chest pain or palpitations.  Expected dissociation gradually resolved over the 2 hour observation period. She feels 50% less depressed with Spravto and wants to continue it.   Continues meds Adderall XR 20 mg every morning, stopped Wellbutrin XL 150 every morning, clonidine 0.2 mg twice daily, lorazepam 1 mg every 8 hours as needed.  Trazodone 50 mg tablets 1-2 nightly as needed insomnia Has started nortriptyline 75 mg nightly for about 3 weeks and DT level 176, reduced to 50 mg HS early November. Increased pramipexole to 0.5 mg  to TID  off label for treatment resistant depression  Started Auvelity 1 AM & PM mid Feb. Mood markedly better with pramipexole added.  Nearly normal mood now with minimal depression.  More social and outgoing and motivated and resolved anhedonia. No SE.  Compliant.  02/04/23 appt noted: Continues meds Adderall XR 20 mg every morning, stopped Wellbutrin XL 150 every morning, clonidine 0.2 mg twice daily, lorazepam 1 mg every 8 hours as needed.  Trazodone 50 mg tablets 1-2 nightly as needed insomnia Has started nortriptyline 75 mg nightly for about 3 weeks and DT level 176, reduced to 50 mg HS early November. Increased pramipexole to 0.5 mg  to TID  off label for treatment resistant depression  Started Auvelity 1 AM & PM mid Feb. Received Spravato 84 mg today as scheduled.  Tolerated it well without nausea or vomiting headache  or chest pain or palpitations.  Expected dissociation gradually resolved over the 2 hour observation period. She feels 50% less depressed with Spravto and wants to continue it.  The pramipexole got her the rest of the way better.  Doesn't want to cut back on any tx bc afraid of relapse. Tolerating meds.  02/15/23 appt noted: Continues meds Adderall XR 20 mg every morning, stopped Wellbutrin XL 150 every morning, clonidine 0.2 mg twice daily, lorazepam 1 mg every 8 hours as needed.  Trazodone 50 mg tablets 1-2 nightly as needed insomnia Has started nortriptyline 75 mg nightly for about 3 weeks and DT level 176, reduced to 50 mg HS early November. Increased pramipexole to 0.5 mg  to TID  off label for treatment resistant depression  Started Auvelity 1 AM & PM mid Feb. Received Spravato 84 mg today as scheduled.  Tolerated it well without nausea or vomiting headache or chest pain or palpitations.  Expected dissociation gradually resolved over the 2 hour observation period. She feels no longer depressed.   The pramipexole got her the rest of the way better.  Doesn't want to cut back on any tx bc afraid of relapse. Tolerating meds.  02/18/23 appt noted: Continues meds Adderall XR 20 mg every morning, stopped Wellbutrin XL 150 every morning, clonidine 0.2 mg twice daily, lorazepam 1 mg every 8 hours as needed.  Trazodone 50 mg tablets 1-2 nightly as needed insomnia Has started nortriptyline 75 mg nightly for about 3 weeks and DT level 176, reduced to 50 mg HS early November. Increased pramipexole to 0.5 mg  to TID  off label for treatment resistant depression  Started Auvelity 1 AM & PM mid Feb. Received Spravato 84 mg today as scheduled.  Tolerated it well without nausea or vomiting headache or chest pain or palpitations.  Expected dissociation gradually resolved over the 2 hour observation period. She feels no longer depressed except when ran out of Auvelity this week DT lack of availability.  Much worse  without it..   The pramipexole got her the rest of the way better.  Doesn't want to cut back on any tx bc afraid of relapse. Tolerating meds.  02/25/23 appt: Received Spravato 84 mg today as scheduled.  Tolerated it well without nausea or vomiting headache or chest pain or palpitations.  Expected dissociation gradually resolved over the 2 hour observation period. Continues meds Adderall XR 20 mg every morning, stopped Wellbutrin XL 150 every morning, clonidine 0.2 mg twice daily, lorazepam 1 mg every 8 hours as needed.  Trazodone 50 mg tablets 1-2 nightly as needed insomnia Has started nortriptyline 75 mg nightly for about 3 weeks and DT level 176, reduced to 50 mg HS early November. Increased pramipexole to 0.5 mg  to TID  off label for treatment resistant depression  Started Auvelity 1 AM & PM mid Feb. No SE Still dramatically better with meds and Spravato.  Not 100% over depression. Had GS born last week and able to enjoy it now.  Is a little sleepy with pramipexole but manageable.  No change desired.  Sleep is ok.  03/04/23 appt noted: Received Spravato 84 mg today as scheduled.  Tolerated it well without nausea or vomiting headache or chest pain or palpitations.  Expected dissociation gradually resolved over the 2 hour observation period. Continues meds Adderall XR 20 mg every morning, stopped Wellbutrin XL 150 every morning, clonidine 0.2 mg twice daily, lorazepam 1 mg every 8 hours as needed.  Trazodone 50 mg tablets 1-2 nightly as needed insomnia nortriptyline reduced to 50 mg HS early November. Increased pramipexole to 0.5 mg  to TID  off label for treatment resistant depression  Started Auvelity 1 AM & PM mid Feb. Still generally doing well with meds and Spravato except when got tooth abscess.  [place] more depressed after dental surgery.   No SE with meds.   Sleep is OK.  No med changes desired  03/11/23 appt noted: Received Spravato 84 mg today as scheduled.  Tolerated it well  without nausea or vomiting headache or chest pain or palpitations.  Expected dissociation gradually resolved over the 2 hour observation period. Continues meds Adderall XR 20 mg every morning, stopped Wellbutrin XL 150 every morning, clonidine 0.2 mg twice daily, lorazepam 1 mg every 8 hours as needed.  Trazodone 50 mg tablets 1-2 nightly as needed insomnia nortriptyline reduced to 50 mg HS early November. Increased pramipexole to 0.5 mg  to TID  off label for treatment resistant depression  Started Auvelity 1 AM & PM mid Feb. Some increase depression with infection and needing amoxiciliin this week.  Otherwie still doing well with mood and meds. No med changes desired or indicated. No SE  03/21/2023 appointment noted: Continues meds Adderall XR 20 mg every morning, stopped Wellbutrin XL 150 every morning, clonidine 0.2 mg twice daily, lorazepam 1 mg every 8 hours as needed.  Trazodone 50 mg tablets 1-2 nightly as needed insomnia nortriptyline reduced to 50 mg HS early November. Increased pramipexole to 0.5 mg  to TID  off label for treatment resistant depression  Started Auvelity 1 AM & PM mid Feb. Received Spravato 84 mg today as scheduled.  Tolerated it well without nausea or vomiting headache or chest pain or  palpitations.  Expected dissociation gradually resolved over the 2 hour observation period. Depression is much better.  Pretty much resolved this week.  She feels the Auvelity and pramipexole have made the biggest difference.  Obviously the Spravato is also helping significantly.  She wants to continue all of these medications.  She still has anxiety easily.  Especially facing something that she would rather avoid.  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 Tolerating meds without significant side effects. Plan no med changes  03/29/23 appt noted: Continues meds Adderall XR 20 mg every morning, stopped  Wellbutrin XL 150 every morning, clonidine 0.2 mg twice daily, lorazepam 1 mg every 8 hours as needed.  Trazodone 50 mg tablets 1-2 nightly as needed insomnia, nortriptyline 50 mg HS,  pramipexole 0.5 mg  to TID  off label for treatment resistant depression  Started Auvelity 1 AM & PM mid Feb 2024 No SE Received Spravato 84 mg today as scheduled.  Tolerated it well without nausea or vomiting headache or chest pain or palpitations.  Expected dissociation gradually resolved over the 2 hour observation period. Depression continues to improve to a point of very mild sx.  Still hasn't gotten her confidence fully back.  She does enjoy social interactions and is productive at work. She is tolerating the meds well without side effects.  We discussed the possibility of reducing some of the medication given the marked response which was positive with pramipexole.  04/04/23 appt noted: Continues meds Adderall XR 20 mg every morning, stopped Wellbutrin XL 150 every morning, clonidine 0.2 mg twice daily, lorazepam 1 mg every 8 hours as needed.  Trazodone 50 mg tablets 1-2 nightly as needed insomnia, nortriptyline 50 mg HS,  pramipexole 0.5 mg  to TID  off label for treatment resistant depression  Started Auvelity 1 AM & PM mid Feb 2024 No SE Received Spravato 84 mg today as scheduled.  Tolerated it well without nausea or vomiting headache or chest pain or palpitations.  Expected dissociation gradually resolved over the 2 hour observation period. Dep is still much improved with increase in pramipexole.  Largely resolved.  Still gets anxious but takes less lorazepam prn but still needs it.  Sleep ok.  Tolerating meds.  04/10/23 appt noted: Continues meds Adderall XR 20 mg every morning, clonidine 0.2 mg twice daily, lorazepam 1 mg every 8 hours as needed.  Trazodone 50 mg tablets 1-2 nightly as needed insomnia, nortriptyline 50 mg HS,  pramipexole 0.5 mg  to TID  off label for treatment resistant depression  Started  Auvelity 1 AM & PM mid Feb 2024 No SE.  Satisfied with meds.   Received Spravato 84 mg today as scheduled.  Tolerated it well without nausea or vomiting headache or chest pain or palpitations.  Expected dissociation gradually resolved over the 2 hour observation period. Dep is still much improved with increase in pramipexole.  Largely resolved.  Still gets anxious but takes less lorazepam prn but still needs it.  Sleep ok.  Tolerating meds. Did an art show in another town; I couldn't have done this a year ago.    05/07/23 appt noted: Continues meds Adderall XR 20 mg every morning, clonidine 0.2 mg twice daily, lorazepam 1 mg every 8 hours as needed.  Trazodone 50 mg tablets 1-2 nightly as needed insomnia, nortriptyline 25-50 mg HS,  pramipexole 0.5 mg  to TID  off label for  treatment resistant depression  Started Auvelity 1 AM & PM mid Feb 2024 No SE.  Satisfied with meds.   Received Spravato 84 mg today as scheduled.  Tolerated it well without nausea or vomiting headache or chest pain or palpitations.  Expected dissociation gradually resolved over the 2 hour observation period. H says she is her old self.  Dep so much better.  Painting going well.  Anxiety in AM and then better.  Wakes with it.  Structured days more.  Negative self talk gone.  Wonderful to have relief.  Good weekend at a wedding.  Enjoys GS; [place].  More outspoken since the dep but not over the top.   More interest overall and satisfied wth resp.  Agrees to wean nortriptyline but doesn't want other med changes.   ECT-MADRS    Flowsheet Row Clinical Support from 01/08/2023 in [facility] Clinical Support from 08/06/2022 in [facility] Clinical Support from 07/04/2022 in [facility] Clinical Support from 05/21/2022 in [facility] Office Visit from 03/02/2022 in [facility]  MADRS Total Score 21  29 15 27  46      Past Psychiatric Medication Trials: fluoxetine, duloxetine, Viibryd, Pristiq, sertraline, citalopram,  Trintellix anxious and SI Wellbutrin XL 450 Auvelity 1 dose nortriptyline 75 mg nightly for about 3 weeks and DT level 176, reduced to 50 mg HS NR. Pramipexole 1 mg NR Adderall, Adderall XR, Vyvanse, Ritalin, Strattera low dose NR Lorazepam Trazodone  Depakote,  lamotrigine cog complaints Lithium remotely Abilify 7.5  Vraylar 1.5 mg daily agitation and insomnia Rexulti insomnia Latuda 40 one dose, CO anxious and SI Seroquel XR 300 Olanzapine 10  At visit November 12, 2019. We discussed Patient developed an increasingly severe alcohol dependence problem since her last visit in January.  She went to [facility] and has had no alcohol since then except 1 day.  She never abused stimulants but they took her off the stimulants at [facility].  Her ADD was markedly worse.  The Wellbutrin did not help the ADD.   D history lamotrigine rash at 67 yo  Review of Systems:  Review of Systems  Constitutional:  Negative for fatigue.  Musculoskeletal:  Positive for back pain. Negative for arthralgias and joint swelling.       SP hip surgery October 2020  Neurological:  Negative for dizziness, tremors and weakness.  Psychiatric/Behavioral:  Positive for decreased concentration and dysphoric mood. Negative for agitation, behavioral problems, confusion, hallucinations, self-injury, sleep disturbance and suicidal ideas. The patient is nervous/anxious. The patient is not hyperactive.        Forgetful at times about med recommendations.    Medications: I have reviewed the patient's current medications.  Current Outpatient Medications  Medication Sig Dispense Refill   amLODipine (NORVASC) 2.5 MG tablet Take 2.5 mg by mouth daily.     amphetamine-dextroamphetamine (ADDERALL XR) 20 MG 24 hr capsule Take 1 capsule (20 mg total) by mouth every morning. 30 capsule 0   [START  ON 06/04/2023] amphetamine-dextroamphetamine (ADDERALL XR) 20 MG 24 hr capsule Take 1 capsule (20 mg total) by mouth every morning. 30 capsule 0   cloNIDine (CATAPRES) 0.2 MG tablet TAKE 1 TABLET BY MOUTH 2 TIMES DAILY. 180 tablet 0   Dextromethorphan-buPROPion ER (AUVELITY) 45-105 MG TBCR Take 1 tablet by mouth 2 (two) times daily. 60 tablet 3   Esketamine HCl, 84 MG Dose, (SPRAVATO, 84 MG DOSE,) 28 MG/DEVICE SOPK  USE 3 SPRAYS IN EACH NOSTRIL ONCE A WEEK 3 each 2   iron polysaccharides (NIFEREX) 150 MG capsule TAKE 1 CAPSULE BY MOUTH EVERY DAY 90 capsule 1   LORazepam (ATIVAN) 1 MG tablet Take 1 tablet (1 mg total) by mouth every 8 (eight) hours as needed. for anxiety 90 tablet 1   losartan (COZAAR) 50 MG tablet Take 50 mg by mouth daily.     nebivolol (BYSTOLIC) 2.5 MG tablet Take 2.5 mg by mouth daily.     pramipexole (MIRAPEX) 0.5 MG tablet Take 1 tablet (0.5 mg total) by mouth 3 (three) times daily. 90 tablet 1   traZODone (DESYREL) 50 MG tablet TAKE 1-2 TABLETS BY MOUTH NIGHTLY AS NEEDED FOR SLEEP 180 tablet 1   No current facility-administered medications for this visit.    Medication Side Effects: None  Allergies:  Allergies  Allergen Reactions   Metronidazole Shortness Of Breath and Other (See Comments)    Heart pounding   Ferrlecit [Na Ferric Gluc Cplx In Sucrose] Other (See Comments)    Infusion reaction 05/12/2019    Past Medical History:  Diagnosis Date   ADHD    Anemia    Anxiety    Arthritis    Depression    Heart murmur    i went to see a cardiologit slast eyar  and i had zero plaque,    PONV (postoperative nausea and vomiting)    Recovering alcoholic in remission (HCC)     Family History  Problem Relation Age of Onset   Atrial fibrillation Mother    CAD Father     Past Medical History, Surgical history, Social history, and Family history were reviewed and updated as appropriate.   Please see review of systems for further details on the patient's review  from today.   Objective:   Physical Exam:  There were no vitals taken for this visit.  Physical Exam Constitutional:      General: She is not in acute distress. Neurological:     Mental Status: She is alert and oriented to person, place, and time.     Coordination: Coordination normal.     Gait: Gait normal.  Psychiatric:        Attention and Perception: Attention and perception normal.        Mood and Affect: Mood is not anxious or depressed.        Speech: Speech is not rapid and pressured.        Behavior: Behavior is not slowed or hyperactive.        Thought Content: Thought content is not paranoid or delusional. Thought content does not include homicidal or suicidal ideation. Thought content does not include suicidal plan.        Cognition and Memory: Cognition normal. Memory is not impaired.        Judgment: Judgment normal.     Comments: Insight intact. No auditory or visual hallucinations. No delusions.  Depression resolved with pramipexole TID and Auvelity. No manic sx     Lab Review:     Component Value Date/Time   NA 137 01/12/2021 1430   NA 140 11/18/2018 1544   K 3.8 01/12/2021 1430   CL 108 01/12/2021 1430   CO2 22 01/12/2021 1430   GLUCOSE 94 01/12/2021 1430   BUN 14 01/12/2021 1430   BUN 20 11/18/2018 1544   CREATININE 0.82 01/12/2021 1430   CALCIUM 8.9 01/12/2021 1430   PROT 6.6 01/12/2021 1430   ALBUMIN 3.9 01/12/2021 1430  AST 12 (L) 01/12/2021 1430   ALT 11 01/12/2021 1430   ALKPHOS 46 01/12/2021 1430   BILITOT 0.5 01/12/2021 1430   GFRNONAA >60 01/12/2021 1430   GFRAA >60 09/02/2019 0249   GFRAA >60 01/27/2019 0811       Component Value Date/Time   WBC 4.5 01/12/2021 1430   RBC 4.32 01/12/2021 1430   HGB 12.8 01/12/2021 1430   HGB 12.9 07/17/2019 0953   HCT 38.5 01/12/2021 1430   HCT 21.9 (L) 12/25/2018 1221   PLT 272 01/12/2021 1430   PLT 286 07/17/2019 0953   MCV 89.1 01/12/2021 1430   MCH 29.6 01/12/2021 1430   MCHC 33.2  01/12/2021 1430   RDW 12.4 01/12/2021 1430   LYMPHSABS 1.4 01/12/2021 1430   MONOABS 0.4 01/12/2021 1430   EOSABS 0.0 01/12/2021 1430   BASOSABS 0.0 01/12/2021 1430    No results found for: "POCLITH", "LITHIUM"   No results found for: "PHENYTOIN", "PHENOBARB", "VALPROATE", "CBMZ"   .res Assessment: Plan:    Recurrent major depression resistant to treatment (HCC)  Generalized anxiety disorder  Attention deficit hyperactivity disorder (ADHD), predominantly inattentive type - Plan: amphetamine-dextroamphetamine (ADDERALL XR) 20 MG 24 hr capsule, amphetamine-dextroamphetamine (ADDERALL XR) 20 MG 24 hr capsule  Insomnia due to mental condition   She has treatment resistant major depression ongoing with 50% better with Spravato. Have  discussed some of her  abnormal behaviors last year leading to this depressive episode getting worse which she says were associated with heavy use of delta 8 and not a manic episode.  She realizes now that that was not good for her.  She stopped all use of other drugs including those available over-the-counter such as delta 8 or any other THC related products.  She is no longer having any of those types of behaviors and instead is depressed.  She is experiencing more pleasure and is less blunted and enjoying more and better inteest versus before the Spravato.  She also feels worse if misses a dose of Spravato.  Depression nearly resolved with pramipexole TID and relapsed when out of Auvelity.  Depression resolved recently after increasing pramipexole 0.5 mg TID, brief relapse when out of Auvelity Consider switch to ER to reduce sleepiness  Patient was administered Spravato 84 mg intranasally today.  The patient experienced the typical dissociation which gradually resolved over the 2-hour period of observation.  There were no complications.  Specifically the patient did not have nausea or vomiting or headache.  Blood pressures monitored at the 40-minute and  2-hour follow-up intervals.  Borderline high.  By the time the 2-hour observation period was met the patient was alert and oriented and able to exit without assistance.  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  See nursing note for further details.She wants to continue Spravato. We discussed discussed the side effects in detail as well as the protocol required to receive Spravato.    has failed all major categories of antidepressants except TCAs and MAO inhibitors which have not been tried until now.  Started nortriptyline 75 mg nightly.  Serum level 176.  So Reduced to 50 mg HS. Rec taper off. Discussed side effects in detail.  We discussed the possibility of trying to wean off nortriptyline in the hopes that she can maintain an adequate response with the Auvelity plus pramipexole and Spravato.  She agrees to wean.  She is tolerating Auvelity.  Continue 1  BID  bc more depressed without it dramatically.  Relapsed witout it.  Worse with less pramipexole. Increased to 0.5 mg TID on about end of Feb 2024 and markedly better Dosing range off label for depression ranges from 1-5 mg daily.  Disc risk compulsive impulsive behavior and mania.    Started Spravato 84 mg twice weekly on 03/16/2022.  Now on weekly administration  Adderall  XR 20 mg AM   Ativan 1 mg 3 times daily as needed anxiety but try to cut it back.  She still feels she needs it. Is not ideal to use benzodiazepine with stimulant but because of the severity of her symptoms it has been necessary.  Hope to eventually eliminate the benzodiazepine.  Expected as her depression improves her anxiety will improve as well.   We will expect that to improve as the depression improves.  She has headed insturctions to reduce this.  Continue clonidine 0.2 mg BID off label for anxiety and helps BP partially. BP is better controlled but not consistent.  Consider  increasing amlodipine.  Also on losartan 50  Discussed potential benefits, risks, and side effects of stimulants with patient to include increased heart rate, palpitations, insomnia, increased anxiety, increased irritability, or decreased appetite.  Instructed patient to contact office if experiencing any significant tolerability issues. She wants to return to usual dose of Adderall for ADD bc of mor poor cognitive function with reduction.  Also discussed that depression will impair cognitive function.  Disc risk polypharmacy.  Reevaluate meds after better with pramipexole.  Relapsed after running out of Auvelity so needs clearly it and pramipexole.  Give it more time.  But consider trying to wean nortriptyline in hopes she can do as well with less medication.  Rec keep track of BP and discuss with PCP. It has been up and down.  Sometimes needs extra clonidine before Spravato.  Better lately.  Has Maintained sobriety.    No med changes indicated today: Continue Adderall XR 20 mg every morning Continue Auvelity 1 tablet twice daily,  continue clonidine 0.2 mg twice daily for anxiety and blood pressure. Continue lorazepam 1 mg 3 times daily she has been unable to cut back on the dose due to anxiety. Wean  nortriptyline 50 mg nightly Continue pramipexole 0.5 mg 3 times daily Continue trazodone 50 mg tablets 1-2 nightly as needed for sleep.  FU with Spravato weekly   Meredith Staggers, MD, DFAPA     Please see After Visit Summary for patient specific instructions.  No future appointments.                   No orders of the defined types were placed in this encounter.      -------------------------------

## 2023-05-08 ENCOUNTER — Other Ambulatory Visit: Payer: Self-pay | Admitting: Psychiatry

## 2023-05-08 NOTE — Progress Notes (Signed)
NURSES NOTE:   Patient arrived for her weekly 84mg  Spravato treatment. Pt is being treated for Treatment Resistant Depression this is #66 treatment, pt will be receiving 84 mg which will continue to be her maintenance dose, she receives weekly treatments.  Patient arrived and taken to treatment room. Confirmed she had a ride home which is her husband would be coming back to pick up pt when done and sometimes she needs to use Benedetto Goad if he is unable to pick her up. Pt's Spravato is ordered through [employer] and delivered to office, all Spravato medication is stored at doctors office per REMS/FDA guidelines. The medication is required to be locked behind two doors per FDA/REMS Protocol. Medication is also disposed of properly per regulations. Pt's prior authorization for Spravato 84 mg was renewed with an approval through 07/17/2023 with [employer] [facility]. All treatments with vital signs documented with Spravato REMS per protocol of being a treatment center.    Pt reports feeling very good, she has a new grand baby, she is painting more. Pt is doing extremely well. Very talkative today. Began taking patient's vital signs at 10:00 AM 114/84, pulse 70. Pulse Ox 99%. Gave patient first dose 28 mg nasal spray, each nasal spray administered in each nostril as directed and waited 5 minutes between the second and third dose. All 3 doses given pt did not complain of any nausea/vomiting, given a cup of water due to the taste after the administration of Spravato.  She listens to [employer] with spa or relaxing music. Pt is not sedate today, and her vital signs are stable. Checked 40 minute vitals at 10:40 AM, 100/70, pulse 68, Pulse Ox 92%. Explained she would be monitored for a total time of 120 minutes. Discharge vitals were taken at 12:00 PM 98/65 P 63, 99% Pulse Ox. Avelina Laine NP met with pt today and discussed her medication. I walked pt to elevator, where her husband met her for the ride home. Recommend she go home and  sleep or just relax on the couch. No driving, no intense activities. Verbalized understanding. Nurse was with pt a total of 70 minutes for clinical. Pt will be scheduled, next week but she says she will call me back to let me know.. Pt instructed to call office with any problems or questions.      LOT 23MG [id number] EXP FEB 2027

## 2023-05-15 ENCOUNTER — Ambulatory Visit: Payer: 59

## 2023-05-15 ENCOUNTER — Other Ambulatory Visit: Payer: Self-pay | Admitting: Psychiatry

## 2023-05-15 ENCOUNTER — Ambulatory Visit (INDEPENDENT_AMBULATORY_CARE_PROVIDER_SITE_OTHER): Payer: 59 | Admitting: Adult Health

## 2023-05-15 ENCOUNTER — Encounter: Payer: Self-pay | Admitting: Adult Health

## 2023-05-15 VITALS — BP 142/90 | HR 60

## 2023-05-15 DIAGNOSIS — F339 Major depressive disorder, recurrent, unspecified: Secondary | ICD-10-CM

## 2023-05-15 NOTE — Progress Notes (Signed)
NURSES NOTE:   Patient arrived for her weekly 84mg  Spravato treatment. Pt is being treated for Treatment Resistant Depression this is #67 treatment, pt will be receiving 84 mg which will continue to be her maintenance dose, she receives weekly treatments.  Patient arrived and taken to treatment room. Confirmed she had a ride home which is her husband would be coming back to pick up pt when done and sometimes she needs to use Benedetto Goad if he is unable to pick her up. Pt's Spravato is ordered through [employer] and delivered to office, all Spravato medication is stored at doctors office per REMS/FDA guidelines. The medication is required to be locked behind two doors per FDA/REMS Protocol. Medication is also disposed of properly per regulations. Pt's prior authorization for Spravato 84 mg was renewed with an approval through 07/17/2023 with [facility]. All treatments with vital signs documented with Spravato REMS per protocol of being a treatment center.    Pt reports feeling very good, she has a new grand baby, she is painting more. Pt is doing extremely well. Very talkative today. Began taking patient's vital signs at 9:45 AM 111/71, pulse 58. Pulse Ox 99%. Gave patient first dose 28 mg nasal spray, each nasal spray administered in each nostril as directed and waited 5 minutes between the second and third dose. All 3 doses given pt did not complain of any nausea/vomiting, given a cup of water due to the taste after the administration of Spravato.  She listens to [employer] with spa or relaxing music. Pt is not sedate today, and her vital signs are stable. Checked 40 minute vitals at 10:30 AM, 119/79, pulse 57, Pulse Ox 92%. Explained she would be monitored for a total time of 120 minutes. Discharge vitals were taken at 11:45 PM 142/90 P 60, 99% Pulse Ox. Yvette Rack, NP met with pt today and discussed her medication. I walked pt to elevator, where her husband met her for the ride home. Recommend she go home  and sleep or just relax on the couch. No driving, no intense activities. Verbalized understanding. Nurse was with pt a total of 70 minutes for clinical. Pt will be scheduled, next week but she says she will call me back to let me know.. Pt instructed to call office with any problems or questions.      LOT [id number] EXP APR 2027

## 2023-05-15 NOTE — Progress Notes (Signed)
Laura Chang [id number] Sep 07, 1956 67 y.o.  Subjective:   Patient ID:  Laura Chang is a 67 y.o. (DOB [birth year]) female.  Chief Complaint: No chief complaint on file.   HPI Laura Chang presents to the office today for follow-up of follow-up of treatment resistant depression (TRD).   Spravato treatment    Patient was administered Spravato 84 mg intranasally today. Patient was observed by provider throughout [facility] treatment. The patient experienced the typical dissociation which gradually resolved over the 2-hour period of observation. There were no complications. Specifically, the patient did not have any untoward side effects - feeling disconnected from themself, their thoughts, feelings and things around them, dizziness, nausea, feeling sleepy, decreased feeling of sensitivity (numbness) spinning sensation, feeling anxious, lack of energy, increased blood pressure, feeling happy or very excited, or headache. Blood pressures remained within normal ranges at the 40-minute and 2-hour follow-up intervals. By the time the 2-hour observation period was met the patient was alert and oriented and able to exit without assistance. Patient willing to continue Spravato administration for the treatment of resistant depression. See nursing note for further details.     ECT-MADRS    Flowsheet Row Clinical Support from 01/08/2023 in [facility] Clinical Support from 08/06/2022 in [facility] Clinical Support from 07/04/2022 in [facility] Clinical Support from 05/21/2022 in [facility] Office Visit from 03/02/2022 in [facility]  MADRS Total Score 21 29 15 27  46        Review of Systems:  Review of Systems  Musculoskeletal:  Negative for gait problem.  Neurological:  Negative for tremors.  Psychiatric/Behavioral:         Please refer to HPI     Medications: I have reviewed the patient's current medications.  Current Outpatient Medications  Medication Sig Dispense Refill   amLODipine (NORVASC) 2.5 MG tablet Take 2.5 mg by mouth daily.     amphetamine-dextroamphetamine (ADDERALL XR) 20 MG 24 hr capsule Take 1 capsule (20 mg total) by mouth every morning. 30 capsule 0   [START ON 06/04/2023] amphetamine-dextroamphetamine (ADDERALL XR) 20 MG 24 hr capsule Take 1 capsule (20 mg total) by mouth every morning. 30 capsule 0   cloNIDine (CATAPRES) 0.2 MG tablet TAKE 1 TABLET BY MOUTH 2 TIMES DAILY. 180 tablet 0   Dextromethorphan-buPROPion ER (AUVELITY) 45-105 MG TBCR Take 1 tablet by mouth 2 (two) times daily. 60 tablet 3   Esketamine HCl, 84 MG Dose, (SPRAVATO, 84 MG DOSE,) 28 MG/DEVICE SOPK USE 3 SPRAYS IN EACH NOSTRIL ONCE A WEEK 3 each 2   iron polysaccharides (NIFEREX) 150 MG capsule TAKE 1 CAPSULE BY MOUTH EVERY DAY 90 capsule 1   LORazepam (ATIVAN) 1 MG tablet Take 1 tablet (1 mg total) by mouth every 8 (eight) hours as needed. for anxiety 90 tablet 1   losartan (COZAAR) 50 MG tablet Take 50 mg by mouth daily.     nebivolol (BYSTOLIC) 2.5 MG tablet Take 2.5 mg by mouth daily.     pramipexole (MIRAPEX) 0.5 MG tablet Take 1 tablet (0.5 mg total) by mouth 3 (three) times daily. 90 tablet 1   traZODone (DESYREL) 50 MG tablet TAKE 1-2 TABLETS BY MOUTH NIGHTLY AS NEEDED FOR SLEEP 180 tablet 1   No current facility-administered medications for this visit.    Medication Side Effects: None  Allergies:  Allergies  Allergen Reactions   Metronidazole Shortness Of Breath and Other (  See Comments)    Heart pounding   Ferrlecit [Na Ferric Gluc Cplx In Sucrose] Other (See Comments)    Infusion reaction 05/12/2019    Past Medical History:  Diagnosis Date   ADHD    Anemia    Anxiety    Arthritis    Depression    Heart murmur    i went to see a cardiologit slast eyar  and i had zero plaque,    PONV (postoperative nausea and vomiting)     Recovering alcoholic in remission [facility])     Past Medical History, Surgical history, Social history, and Family history were reviewed and updated as appropriate.   Please see review of systems for further details on the patient's review from today.   Objective:   Physical Exam:  There were no vitals taken for this visit.  Physical Exam Constitutional:      General: She is not in acute distress. Musculoskeletal:        General: No deformity.  Neurological:     Mental Status: She is alert and oriented to person, place, and time.     Coordination: Coordination normal.  Psychiatric:        Attention and Perception: Attention and perception normal. She does not perceive auditory or visual hallucinations.        Mood and Affect: Affect is not labile, blunt, angry or inappropriate.        Speech: Speech normal.        Behavior: Behavior normal.        Thought Content: Thought content normal. Thought content is not paranoid or delusional. Thought content does not include homicidal or suicidal ideation. Thought content does not include homicidal or suicidal plan.        Cognition and Memory: Cognition and memory normal.        Judgment: Judgment normal.     Comments: Insight intact     Lab Review:     Component Value Date/Time   NA 137 01/12/2021 1430   NA 140 11/18/2018 1544   K 3.8 01/12/2021 1430   CL 108 01/12/2021 1430   CO2 22 01/12/2021 1430   GLUCOSE 94 01/12/2021 1430   BUN 14 01/12/2021 1430   BUN 20 11/18/2018 1544   CREATININE 0.82 01/12/2021 1430   CALCIUM 8.9 01/12/2021 1430   PROT 6.6 01/12/2021 1430   ALBUMIN 3.9 01/12/2021 1430   AST 12 (L) 01/12/2021 1430   ALT 11 01/12/2021 1430   ALKPHOS 46 01/12/2021 1430   BILITOT 0.5 01/12/2021 1430   GFRNONAA >60 01/12/2021 1430   GFRAA >60 09/02/2019 0249   GFRAA >60 01/27/2019 0811       Component Value Date/Time   WBC 4.5 01/12/2021 1430   RBC 4.32 01/12/2021 1430   HGB 12.8 01/12/2021 1430   HGB 12.9  07/17/2019 0953   HCT 38.5 01/12/2021 1430   HCT 21.9 (L) 12/25/2018 1221   PLT 272 01/12/2021 1430   PLT 286 07/17/2019 0953   MCV 89.1 01/12/2021 1430   MCH 29.6 01/12/2021 1430   MCHC 33.2 01/12/2021 1430   RDW 12.4 01/12/2021 1430   LYMPHSABS 1.4 01/12/2021 1430   MONOABS 0.4 01/12/2021 1430   EOSABS 0.0 01/12/2021 1430   BASOSABS 0.0 01/12/2021 1430    No results found for: "POCLITH", "LITHIUM"   No results found for: "PHENYTOIN", "PHENOBARB", "VALPROATE", "CBMZ"   .res Assessment: Plan:    Recommendations/Plan   Continue Spravato treatment.   Patient advised to contact  office with any questions, adverse effects, or acute worsening in signs and symptoms.  Diagnoses and all orders for this visit:  Recurrent major depression resistant to treatment [facility])     Please see After Visit Summary for patient specific instructions.  No future appointments.  No orders of the defined types were placed in this encounter.   -------------------------------

## 2023-05-20 ENCOUNTER — Other Ambulatory Visit: Payer: Self-pay | Admitting: Psychiatry

## 2023-05-20 DIAGNOSIS — F411 Generalized anxiety disorder: Secondary | ICD-10-CM

## 2023-05-20 DIAGNOSIS — I10 Essential (primary) hypertension: Secondary | ICD-10-CM

## 2023-05-23 ENCOUNTER — Other Ambulatory Visit: Payer: Self-pay

## 2023-05-26 ENCOUNTER — Other Ambulatory Visit: Payer: Self-pay | Admitting: Psychiatry

## 2023-05-26 DIAGNOSIS — F339 Major depressive disorder, recurrent, unspecified: Secondary | ICD-10-CM

## 2023-05-27 ENCOUNTER — Encounter: Payer: Self-pay | Admitting: Psychiatry

## 2023-05-27 ENCOUNTER — Ambulatory Visit (INDEPENDENT_AMBULATORY_CARE_PROVIDER_SITE_OTHER): Payer: 59 | Admitting: Psychiatry

## 2023-05-27 ENCOUNTER — Ambulatory Visit: Payer: 59

## 2023-05-27 VITALS — BP 95/69 | HR 50

## 2023-05-27 DIAGNOSIS — F339 Major depressive disorder, recurrent, unspecified: Secondary | ICD-10-CM

## 2023-05-27 DIAGNOSIS — F5105 Insomnia due to other mental disorder: Secondary | ICD-10-CM | POA: Diagnosis not present

## 2023-05-27 DIAGNOSIS — F411 Generalized anxiety disorder: Secondary | ICD-10-CM | POA: Diagnosis not present

## 2023-05-27 DIAGNOSIS — F900 Attention-deficit hyperactivity disorder, predominantly inattentive type: Secondary | ICD-10-CM

## 2023-05-27 NOTE — Progress Notes (Signed)
Laura Chang [id number] May 16, 1956 67 y.o.  Subjective:   Patient ID:  Laura Chang is a 67 y.o. (DOB 04/11/1956) female.  Chief Complaint:  Chief Complaint  Patient presents with   Follow-up   Depression   Anxiety   ADD   Sleeping Problem      Laura Chang presents to the office today for follow-up of depression and anxiety and ADD.  seen November 12, 2019.  Melted down in 2020.  Went to [facility] in July.  No withdrawal.  1 drink since then.  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 She was on no stimulant and no SSRI but was taking Strattera and Wellbutrin.  The following changes were made. Stop Strattera. OK restart stimulant bc severe ADD Restart Adderall 1 daily for a few days and if tolerated then restart 1 twice daily. If not tolerated reduce the dosage if needed. May need to stop Wellbutrin if not tolerating the stimulant.  Yes.  DC Wellbutrin Restart Prozac 20 mg daily.  February 2021 appointment with the following noted: Completed grant proposal.  Couldn't doit without Adderall.  Sold a bunch of work.   Adderall XR lasts about 3 pm.  Strength seems about right.  BP been OK.  Not jittery.   Stopped Wellbutrin but had no SE. Mood drastically better with grant proposal and back on fluoxetine.  Less depressed and lethargic.  No anxiety.  Cut back on coffee. Started back with devotions and stronger faith. Plan: Continue Prozac 20 mg daily. May have to increase the dose at some point in the future given that she usually was taking higher dosages but she is getting good response at this time. Restart Wellbutrin off label for ADD since can't get 2 ADDERALL daily. 150 mg daily then 300 mg daily. She can adjust the dose between 150 mg and 300 mg daily to get the optimal effect.   05/11/2020 appointment with the following noted: Has been inconsistent with Prozac and Wellbutrin. Not sure of the effect of Wellbutrin. Biggest deterrent in  work is anxiety.  Some of the work is conceptual and difficult at times.  Can feel she's not up to a project at times.  Overall is OK but would like a steadier benefit from stimulant.  Exhausted from managing concentration and keeping up with things from the day.  Loses things.  Not good keeping up with schedule. Overall productive and emotionally OK. Can feel Adderall wear off. Mood is better in summer and worse in the winter.   F died in 11/01/2023 and that is a loss. No SE Wellbutrin. Still attends AA meetings.  Real benefit from [facility] last year. Recognizes effect of anemia on ADD and mood.  Had iron infusions last winter. Plan:  Wellbutrin off label for ADD since can't get 2 ADDERALL daily. 150 mg daily then 300 mg daily.  01/24/2021 appointment with following noted: Doing a program called [employer] mindfulness app since Xmas.  CBT app helped the depression.  App helped her focus better.  Lost sign weight. Writing a lot. Before Xmas felt depressed and started negative thinking worse, self denigrating. Not drinking. More isolated.   Recognizes mo is narcissist.    Didn't tell anyone she was born until 3 mos later.  M aloof and uninterested in pt.  Lied about her birthday.  Mo lack of affection even with pt's kids. Going to AA for a year and it helped her to quit drinking. Also  misses kids being gone with a hole also. Plan: No med changes  05/04/2021 appointment with the following noted: Therapist [name] thinks she's manic. Lost weight to 144#.   States she is still sleeping okay.  Admits she is hyper and recognizes that she is likely manic.  She feels great, euphoric with an increased sense of spiritual connectedness to God.  She has racing thoughts and talks fast and talks a lot and this is noted by her husband.  He thinks she is a bit hyper.  She has been able to maintain sobriety although she will have 1 glass of wine on special occasions but does not drink by herself.  She  is not drinking to excess.  She denies any dangerous impulsivity.  She is clearly not depressed and not particularly anxious.  She has no concerns about her medication and she has been compliant.  06/16/21 appt noted: So much better.  Going through a lot but the manic thing happened on top of it.  So much slower.  Didn't feel like losing anything with risperidone.  Likes the Adderalll at 10 mg. Some drowsiness in the AM and very drowsy from risperidone 2 mg HS. Prayer life is better. Handling stress better. Less depressed with risperidone. Still likes trazodone. Sleeps well. Plan: Reduce Prozac to 10 mg daily.  Consider stopping it because it can feel the mania however she is reluctant to do that because she fears relapse of depression. Reduce risperidone to 1.5 mg nightly due to side effects.  Discussed risk of worsening mania.  07/25/2021 appointment with the following noted: Misses the Adderall and hard to function without it. Depressed now. Heavy chest.  Anxious and guilty.  Body feels heavy.   Hates Wellbutrin.   Plan: Increase fluoxetine to 20 mg daily Add Abilify 1/2 of 15 mg tablet daily Wean wellbutrin by 1 tablet each week  bc she feels it is not helpful and DT polypharmacy Reduce risperidone to 1 daily for 1 week and stop it. Disc risk of mania. Increase Adderall to XR 20 mg AM  08/08/21 Much less depressed and starting to feel normal I feel a lot better. No SE.  Speech normal off risperidone. Sleeping OK on trazaodone and enough.   Noticed benefit from Adderall again. Plan: continue fluoxetine to 20 mg daily Continue Abilify 1/2 of 15 mg tablet daily for depression and mania continue Adderall to XR 20 mg AM  10/10/2021 phone call: Pt stated she feels like the Abilify should be decreased to 5mg .She said she is depressed but rational and not suicidal.She has an appt Monday and can wait until then if you prefer. MD response: Reduce the Abilify to 7.5 mg every other day.  We will  meet on 10/16/2021 and decide what to do from there.  10/16/2021 appointment with the following noted: More depressed.  Most depressed I've ever been.  Just numb.  Sense of grief.   Thinks the manic episode was unlike anything else she ever had.  Doesn't want to medicate against it.  Don't enjoy people.  Easily overwhelmed.  Had some death thoughts but not suicidal.  Has been functional.  Feels better today after reducing Abilify to every other day but she is only been doing that for 3 days. A/P: Episode of post manic depression was explained. continue fluoxetine to 20 mg daily Hold Abilify for 1 week then resume Abilify 1/2 of 15 mg tablet every other day for depression and mania continue Adderall to XR 20  mg AM  10/27/2021 appointment with the following noted: I'm doing so much better.  Handling the depression better. Better self talk and spiritual focus has helped.   Dep 6/10 manifesting as anxiety with low confidence.   F died 2  years ago and M 67 yo and is dependent . She is working hard to feel better but still feels depressed.  She almost feels like she has a little more anxiety since restarting Abilify every other day. Plan: continue fluoxetine to 20 mg daily DC Abilify .  Vrayalar 1.5 mg QOD to try to get rid of depression ASAP. continue Adderall to XR 20 mg AM  11/10/2021 appointment with the following noted: Busy with Xmas and it was fun with family but then a big let down.  Did well with it.  Functioned well with it.  Working hard on things with depression.  Not shutting down. Not sure but feels better today but yesterday was hard.  Difficulty dealing with mother.  She won't do anything to help herself.  Yesterday with her all day.  Won't do PT and has isolated herself.    Lack of confidence.   No SE with Vraylar.  11/24/21 urgent appointment appt noted: More and more depressed.   So anxious and doesn't want to be alone but can do so. No appetite. Hurts inside. Has had some  fleeting suicidal thoughts but would not act on them.  Tolerating meds. Has been consistent with Vraylar 1.5 mg every other day, fluoxetine 20 mg daily Plan: Increase Vraylar to 1.5 mg daily Change Prozac to Trintellix 10 mg daily. Discussed side effects of each continue Adderall to XR 20 mg AM  12/27/2021 appointment with the following noted: Not OK.  I feel less depressed but feels bat shit. Not sleeping well.  Extremely anxious. Off and on sleep. 3-4 hours of sleep.   Still having daily SI.  But also become obvious has so much to do.  Overwhelmed by tasks.   Needs anxiety meds to just function. Not more motivated.  Walked yesterday.   Feels afraid like in trouble but not irritable or angry. DC DT agitation Vraylar to 1.5 mg daily Change Prozac to Trintellix 10 mg daily. Hold Adderall to XR 20 mg AM Clonidine 0.1 1/2 tablet twice daily for 2 days and if needed for anxiety and sleep increase to 1 twice daily Ok temporary Ativan 1 mg 3 times daily as needed anxiety  01/05/22 appt noted: Off fluoxetine and  Trintellix.  Only on Ativan, trazodone and Adderall XR 20 plus added clonidine 0.1 mg BID Didn't think she needed to start Trintellix. Not taking Ativan.   Didn't like herself last week. Feels some better today. Wonders if the manic sx Not agitated.  Anxiety kind of calmed down.  A lot to be anxious about situationally.  $ stress. Concerns about downers with meds. Can't access normal personality. ? Lethargy and inability to talk as sE. Plan: Latuda 20-40 mg daily with food. Adderall to XR 20 mg AM Clonidine 0.1 1/2 tablet twice daily  reduce dose to be sure no SE Ok temporary Ativan 1 mg 3 times daily as needed anxiety  01/19/22 appt noted: Taking Latuda 20 mg daily.  Took 40 mg once and felt anxious and  SI Still depressed and not very reactive Anxiety mainly about the depression and fears of the future. She wants to revisit manic sx and thinks it was maybe bc taking delta 8  bc was taking a lot  of it so still doesn't think she's classic bipolar. She wants to only take Prozac bc thinks Latuda is perpetuating depression. Says the delta 8 was very psychaedelic.  When not taking it was not manic.  Sleeping ok again.  Plan: Per her request DC Latuda 20-40 mg daily with food. She wants to continue Prozac alone AMA  Adderall to XR 20 mg AM Clonidine 0.1 1/2 tablet twice daily  reduce dose to be sure no SE Ok temporary Ativan 1 mg 3 times daily as needed anxiety  01/23/2022 phone call complaining of increased anxiety since stopping Latuda.  She will try increasing clonidine.  01/26/2022 phone call not feeling well and wanted to restart the Vraylar.  However notes indicate that had made her agitated therefore she was encouraged to pick up samples of Rexulti 1 mg and start that instead.  02/06/2022 phone call: Stating she felt the Rexulti was helping with depression but she was not sleeping well and obsessing over things.  She was encouraged to increase Rexulti to 2 mg daily and increase trazodone for sleep.  02/09/2022 appointment with the following noted: This was an urgent work in appointment No sleep last night with trazodone 100 mg HS Nothing really better depression or anxiety. Ruminating negative anxious thoughts. Did not tolerate Rexulti because it was causing insomnia.  Does not think it helped depression.  Lacks emotion that she should have.  Lacks her usual personality.  Some hopeless thoughts.  Some death thoughts.  Some suicidal thoughts without plan or intent Plan: DC Rexulti and Prozac & DC trazodone Adderall to XR 20 mg AM Clonidine 0.1 1/tablet twice daily  reduce dose to be sure no SE Ok temporary Ativan 1 mg 3 times daily as needed anxiety Start Seroquel XR 150 mg nightly  03/02/2022 appointment: Angelique Blonder called back a few days after starting Seroquel stating it was making her more anxious and more depressed.  This seemed unlikely as this medicine rarely  ever causes anxiety.  She stopped the medication waited 3 days and called back still had anxiety and depression but thought perhaps the anxiety was a little better.  She did not want to take the Seroquel. She knew about the option of Spravato and wanted to pursue that. Now questions whether to return to Seroquel while waiting to start Spravato bc feels just as bad without it and knows she didn't give it enough time to work.   MADRS 46  ECT-MADRS    Flowsheet Row Clinical Support from 01/08/2023 in [facility] Clinical Support from 08/06/2022 in [facility] Clinical Support from 07/04/2022 in [facility] Clinical Support from 05/21/2022 in [facility] Office Visit from 03/02/2022 in [facility]  MADRS Total Score 21 29 15 27  46      03/14/22 appt noted: Pt received Spravato 56 mg first dose today with some dissociative sx which were not severe.  She was anxious prior to the administration and felt better after receiving lorazepam 1 mg.  No NV, or HA. Wants to continue Spravato. Ongoing depression and desperate to feel better.  I'm not myself DT deprsssion which is most severe in recent history.  Anhedonia.  Low motivation.  Social avoidance. Continues to think all recent med trials are making her worse.  Sleep ok with Seroquel.  03/16/22 appt noted: Received Spravato 84 mg for the first time.  some dissociative sx which were not severe.  She  was anxious prior to the administration and felt better after receiving lorazepam 1 mg.  No NV, or HA. Wants to continue Spravato.   Does not feel any better or different since the last appt.  Ongoing depression.  Ongoing depression and desperate to feel better.  I'm not myself DT deprsssion which is most severe in recent history.  Anhedonia.  Low motivation.  Social avoidance. Continues to think all recent med trials are  making her worse.  Sleep ok with Seroquel.  Does not want to continue Seroquel for TRD.  03/20/2022 appointment noted: Came for Spravato administration today.  However blood pressure was significantly elevated approximately 180/115.  She was given lorazepam 1 mg and clonidine 0.2 mg to try to get it down. She states she regretted stopping the Seroquel XR 300 mg tablets.  She now realizes it was helpful.  She did not sleep much at all last night.  She did not take the Adderall this morning. 2 to 3 hours after arrival blood pressure was still elevated at  170/110, 62 pulse.  For Spravato administration was canceled for today.  She admits to being anxious and depressed.  She is not suicidal.  She is highly motivated to receive the Spravato.  We discussed getting it tomorrow.  03/22/2022 appointment noted: Patient's blood pressure was never stable enough yesterday in order to get her in for Spravato administration.  She was encouraged to see her primary care doctor.  It is better today.  03/26/2022 appointment with the following noted: Blood pressure was better.  Saw her primary care doctor who started on oral Bystolic 2.5 mg daily. Received Spravato 84 mg today as scheduled.  Tolerated it well without nausea or vomiting headache or chest pain or palpitations.  Her blood pressure was borderline but manageable. She remains depressed and anxious.  She is ambivalent about the medicine and desperate to get to feel better.  Continues to have anhedonia and low energy and low motivation and reduced ability to do things.  Less social.  Not suicidal.  03/28/22 appt noted: Received Spravato 84 mg today as scheduled.  Tolerated it well without nausea or vomiting headache or chest pain or palpitations.  Her blood pressure was borderline but manageable. Has not seen any improvement so far.  Tolerating Seroquel.  Inconsistent with Bystolic and BP has been borderline high. Still depressed and anxious and anhedonia.  Low  motivation, energy, productivity. Taking quetiapine and tolerating XR 300 mg nightly.  04/04/22 appt noted: Received Spravato 84 mg today as scheduled.  Tolerated it well without nausea or vomiting headache or chest pain or palpitations.  Her blood pressure was borderline but manageable. Has not seen any improvement so far.  Tolerating Seroquel.   She still tends to think that the medications are making her worse.  She has said this about each of the recent psychiatric medicines including Seroquel.  However her husband thinks she is improved.  She also admits there is some improvement in productivity.  She still feels highly anxious.  She still does not enjoy things as normal.  She still feels desperate to improve as soon as possible. Has been taking Seroquel XR since 03/20/2022  04/10/22 appt noted: Received Spravato 84 mg today as scheduled.  Tolerated it well without nausea or vomiting headache or chest pain or palpitations.  Her blood pressure was borderline but manageable. Has not seen any improvement so far.  Tolerating Seroquel.  Doesn't like Seroquel bc she thinks it flattens here. 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  Tolerated it well without nausea or vomiting headache or chest pain or palpitations.  Her blood pressure was borderline but manageable. Has not seen any improvement so far.  Tolerating Seroquel.  Doesn't like Seroquel bc she thinks it flattens her. Received Spravato 84 mg today as scheduled.  Tolerated it well without nausea or vomiting headache or chest pain or palpitations.  Her blood pressure was borderline but manageable. Has not seen any improvement so far.  Tolerating Seroquel.  Doesn't like Seroquel bc she thinks it flattens here.  We discussed her ambivalence about it. She is starting Auvelity  and has tolerated it the last 2 days without side effect.  She still does not feel like herself and feels flat and not enjoying things with suppressed expressed emotion  04/17/2022 appointment with the following noted: Received Spravato 84 mg today as scheduled.  Tolerated it well without nausea or vomiting headache or chest pain or palpitations.  Her blood pressure was borderline but manageable. Has not seen any improvement so far.  Tolerating Seroquel.  Doesn't like Seroquel bc she thinks it flattens her. She has been tolerating the Auvelity 1 in the morning without side effects for about a week.  She has not noticed significant improvement so far.  She still feels depressed and flat and not herself.  Other people notice that she is flat emotionally.  She is not suicidal.  She does feel discouraged that she is not getting better yet.  04/19/2022 appointment noted: Has increased Auvelity to 1 twice daily for 2 days, continues quetiapine XR 300 mg nightly, clonidine 0.3 mg twice daily, lorazepam 1 mg twice daily for anxiety and Adderall XR 20 mg in the morning. No obious SE but she still thinks quetiapine XR is making her feel down.  But not sedated Received Spravato 84 mg today as scheduled.  Tolerated it well without nausea or vomiting headache or chest pain or palpitations.  Her blood pressure was borderline but manageable. She still feels quite anxious and feels it necessary to take both the clonidine and lorazepam twice a day to manage her anxiety.  She has been consistently down and flat and not herself until yesterday afternoon she noted an improvement in mood and feeling much more like herself with her normal personality reemerging.  She was quite depressed in the morning with very dark negative thoughts.  She did not have those dark negative thoughts this morning.  She had a lot of questions about medication and when she was expecting to be improved and why she has not shown improvement up to  now.  04/23/22 appt noted: Has increased Auvelity to 1 twice daily for 1 week, continues quetiapine XR 300 mg nightly, clonidine 0.3 mg twice daily, lorazepam 1 mg twice daily for anxiety and Adderall XR 20 mg in the morning. No obious SE but she still thinks quetiapine XR is making her feel down.  But not sedated Received Spravato 84 mg today as scheduled.  Tolerated it well without nausea or vomiting headache or chest pain or palpitations.  She is still depressed but admits better function and is able to enjoy social interactions. Tolerating meds.  Would like to feel better for sure. Not herself.  Flat. Plan increase Auvelity to 1 tab BID as planned and reduce Quetiapine to 1/2 of ER 300 mg  bc  NR for depression.  04/25/2022 appointment with the following noted: clonidine 0.3 mg twice daily, lorazepam 1 mg twice daily for anxiety and Adderall XR 20 mg in the morning. Seroquel XR 300 HS No obious SE but she still thinks quetiapine XR is making her feel down.  But not sedated Received Spravato 84 mg today as scheduled.  Tolerated it well without nausea or vomiting headache or chest pain or palpitations.  Called yesterday with more anxiety.  Had increased Auvelity for 1 day and reduced Seroquel XR for 1 day.  Felt restless and fearful  05/01/2022 appointment noted: clonidine 0.3 mg twice daily, lorazepam 1 mg twice daily for anxiety and Adderall XR 20 mg in the morning. Seroquel XR 150 HS, Auvelity 1 BID Received Spravato 84 mg today as scheduled.  Tolerated it well without nausea or vomiting headache or chest pain or palpitations.  Nurse has noted patient has called multiple times sometimes asking the same question repeatedly.  It is unclear whether she is truly forgetful or is just anxious seeking reassurance. Patient acknowledges ongoing depression as well as some anxiety but states she has felt a little better in the last couple of days.  She has reduced the Seroquel to 150 mg at night and has  increased Auvelity to 1 twice daily but only for 1 day.  So far she seems to be tolerating it.  05/03/22 appt noted: clonidine 0.2 mg twice daily, lorazepam 1 mg twice daily for anxiety and Adderall XR 20 mg in the morning. Seroquel XR 150 HS, Auvelity 1 BID BP high this am about 170/100 and received extra clonidine 0.2 mg and came to receive Spravato.  Not dizzy, no SOB, nor CP but BP is still high Could not receive Spravato today bc BP high and pulse low at 30 ppm. Still depressed and anxious. Plan: continue trial Auvelity with Spravato She needs to get BP and pulse managed  05/08/22 TC: RTC  H Michael NA and mailbox full.  Could not leave message.  Pt  -  talked to she and H on speaker. H worried over wife.  Vacant stare.  Slurs words at times.  Not smiling. Reduced enjoyment.  Depression.  Withdrawn from usual activities.  Some irritability.  Anxious. Disc her concerns meds are making her worse.  Extensive discussion about her treatment resistant status.  There is a consistent pattern of not taking the medicines long enough to get benefit because she believes the meds are making her worse.  However the symptoms she describes as side effects are exactly the same symptoms that she had prior to taking the medication RX for  the depression.  So it is not clear that these are actual side effects. This is true about the 2 most recent meds including Seroquel and Auvelity.  Recommend psychiatric consultation in hopes of improving her comfort level with taking prescribed medications for a sufficient length of time to provide benefit. Extensive discussion about ECT is the treatment of choice for treatment resistant depression.  Spravato may work if she can comply with consistency.  There are medication options but they take longer to work.   Plan:  Reduce clonidine to 0.1 mg BID DT bradycardia.  Talk with PCP about BP and low pulse problems which are interfering with her consistent compliance with  Spravato.   Limit lorazepam to 3 -4  mg daily max. Excess use is the cause of slurring speech.  She must stop excess use or will have to stop the  med. Stop Auvelity per her request.  But she has only been on the full dose for a little over a week and clearly has not had time to get benefit from it.  She thinks maybe it is making her more anxious. Reduce Seroquel from 150XR to 50 -100 mg at night IR.  She couldn't sleep when stopped it completely. Will not start new antidepressant until her SE issues are resolved or not. Get second psych opinion from Wellington Hampshire MD or another psychiatrist.  H's sister is therapist in [place], MD, [facility] [facility]  05/16/2022 appointment with the following noted: Received Spravato 84 mg today as scheduled.  Tolerated it well without nausea or vomiting headache or chest pain or palpitations.  She stopped Auvelity as discussed last week. On her own, without physician input, she restarted Wellbutrin XL 450 mg every morning today.  She had taken it in the past.  She feels jittery and anxious. She feels less depressed than she did last week.  But she is still depressed without her usual range of affect.  She still is less social and less motivated than normal. Her primary care doctor increased the dose of losartan Plan: Stop Seroquel Reduce Wellbutrin XL to 300 mg every morning.  Starting the dose at 450 every morning is likely causing side effects of jitteriness and it should not be started at that have a dosage. Recommend she not change meds on her own without MDM put  05/23/2022 appointment with the following noted: Received Spravato 84 mg today as scheduled.  Tolerated it well without nausea or vomiting headache or chest pain or palpitations.  Has not dropped seroquel XR 300 mg 1/2 tablet nightly bc couldn't sleep without it. Has not tried lower dose quetiapine 50 mg HS Still feels depressed.   BP is better managed so far, just saw PCP.  BP is better today and  infact is low today. Dropped clonidine as directed from 0.3 mg BID bc inadequate control of BP to 0.2 mg BID.  However she wants to increase it back to 0.3 mg twice daily because she feels it helped her anxiety better.  Wonders about increasing Wellbutrin for depression.  However she has only been on 300 mg a day for a week.  She was on 450 mg daily in the past.  06/06/22 appt noted: Received Spravato 84 mg today as scheduled.  Tolerated it well without nausea or vomiting headache or chest pain or palpitations.  She is still depressed and anxious.  She wants to try to stop the Seroquel but cannot sleep without some of it.  She is taking lorazepam 1 mg 4 times daily and still having a lot of anxiety.  She wants to increase clonidine back to 0.3 mg twice daily.  She hopes for more improvement She recently went for a second psychiatric opinion as suggested the results of that are pending.  06/11/22 appt noted: Received Spravato 84 mg today as scheduled.  Tolerated it well without nausea or vomiting headache or chest pain or palpitations.  She is still depressed and anxious. Without much change.  Still hopeless, anhedonia, reduced inteterest and motivation.  Tolerating meds. Disc concerns Spravato is not hleping much. Plan: stop Seroquel and start olanzapine 10 mg HS for TRD and anxiety.  06/13/2022 appointment noted: Received Spravato 84 mg today as scheduled.  Tolerated it well without nausea or vomiting headache or chest pain or palpitations.  She is still depressed and anxious. Without much change.  Still  hopeless, anhedonia, reduced inteterest and motivation.  Tolerating meds. Disc concerns Spravato is not helping much as hoped but is improving a bit in the last week. Tolerating meds. Continues Wellbutrin XL 450 AM, tolerating recently started olanzapine  10 mg HS. Sleep is good.   Pending appt with TMS consult.  06/18/22 appt noted: Received Spravato 84 mg today as scheduled.  Tolerated it well  without nausea or vomiting headache or chest pain or palpitations.  Tolerating meds. Continues Wellbutrin XL 450 AM, tolerating recently started olanzapine  10 mg HS. Continues Adderall XR 20 amd and has tried to reduce lorazepam to 1mg  TID Sleep is good.   Pending appt with TMS consult. Depression is a little bit better in the last week with a little improvement in emotional expression and interest.  She is pushing herself to be more active.  Her daughter thought she was a little better than she has been.  However she is still depressed and still not her normal self with anhedonia and reduced emotional expressiveness.  06/20/22 appt noted: Received Spravato 84 mg today as scheduled.  Tolerated it well without nausea or vomiting headache or chest pain or palpitations.  Tolerating meds with a little sleepiness. Continues Wellbutrin XL 450 AM, tolerating recently started olanzapine  10 mg HS. Continues Adderall XR 20 amd and has tried to reduce lorazepam to 1mg  TID Sleep is good.   Mood is improving.  Better funciton.  Anxiety is better with olanzapine. Still not herself and depression not gone with some anhedonia and social avoidance and feeling overwhelmed.  8/14 2023 received Spravato 84 mg 06/27/2022 received Spravato Spravato 84 mg 07/02/2022 received Spravato 84 mg 07/04/2022 received Spravato 84 mg  07/09/2022 appointment noted: Received Spravato 84 mg today as scheduled.  Tolerated it well without nausea or vomiting headache or chest pain or palpitations.  Expected dissociation and feels less depressed with resolution of negative emotions immediately after Spravato and then depression, anxiety creep back in. Continues meds Adderall XR 20 mg every morning, Wellbutrin XL 450 every morning, clonidine 0.1 mg twice daily, lorazepam 1 mg every 6 hours as needed, olanzapine increased from 7.5 to 10 mg nightly on  Tolerating meds.  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  She does note some increased appetite and is somewhat concerned about that but has not gained significant amounts of weight. She has had the TMS consultation which was initially denied but she knows it can be appealed.  However because she is improving with Spravato plus the other medications now she wants to continue the current treatment plan.  07/18/22 appt noted: Continues meds Adderall XR 20 mg every morning, Wellbutrin XL 450 every morning, clonidine 0.1 mg twice daily, lorazepam 1 mg every 6 hours as needed, olanzapine increased from 7.5 to 10 mg nightly on 07/04/2022. Received Spravato 84 mg today as scheduled.  Tolerated it well without nausea or vomiting headache or chest pain or palpitations.  Expected dissociation and feels less depressed with resolution of negative emotions immediately after Spravato and then depression, anxiety creep back in. Continues meds Adderall XR 20 mg every morning, Wellbutrin XL 450 every morning, clonidine 0.1 mg twice daily, lorazepam 1 mg every 6 hours as needed, olanzapine increased from 7.5 to 10 mg nightly on  Tolerating meds.  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  She does note some increased appetite and is somewhat concerned about that but has not gained significant amounts of weight. She has had the TMS consultation which was initially denied but she knows it can be appealed. She continues to have chronic ambivalence about psychiatric medicines and initially tends to blame her depressive symptoms such as decreased concentration and feeling flat on what ever medicine she currently is taking even though she had the same symptoms before the current medicines were started.  Then after discussion she does admit that her depressive  symptoms are improved since adding olanzapine but still has those residual symptoms noted.  07/23/22 received Spravato 84 mg   07/30/2022 appointment noted: Received Spravato 84 mg today as scheduled.  Tolerated it well without nausea or vomiting headache or chest pain or palpitations.  Expected dissociation and feels less depressed with resolution of negative emotions immediately after Spravato and then depression, anxiety creep back in. Continues meds Adderall XR 20 mg every morning, Wellbutrin XL 450 every morning, clonidine 0.1 mg twice daily, lorazepam 1 mg every 6 hours as needed, olanzapine increased from 7.5 to 10 mg nightly on  She has been inconsistent with olanzapine because she continues to be ambivalent about the medications in general and thinks that perhaps the 10 mg is making her feel blunted.  She continues to feel some depression.  She had a good day this week and but still feels somewhat depressed and persistently anxious. Plan: be consistent with olanzapine 10 mg HS for TRD and longer trial for potential benefit for anxiety.  Has not taken it consistently.  08/06/2022 appointment noted: Received Spravato 84 mg today as scheduled.  Tolerated it well without nausea or vomiting headache or chest pain or palpitations.  Expected dissociation and feels less depressed with resolution of negative emotions immediately after Spravato and then depression, anxiety creep back in. Continues meds Adderall XR 20 mg every morning, Wellbutrin XL 450 every morning, clonidine 0.1 mg twice daily, lorazepam 1 mg every 6 hours as needed, olanzapine i 10 mg nightly  She continues to feel depressed but is about 50% better with Spravato.  She is still not herself.  She still has anhedonia.  She still is not her able to engage socially in the typical ways.  She is not jovial and outgoing like normal.  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  She continues to feel that her personality is dampened down.  There is a question about whether it is related to depression or medication. Plan: continue olanzapine 10 for longer trial for TRD and severe anxiety.  08/13/22 appt noted:  Received Spravato 84 mg today as scheduled.  Tolerated it well without nausea or vomiting headache or chest pain or palpitations.  Expected dissociation and feels less depressed with resolution of negative emotions immediately after Spravato and then depression, anxiety creep back in. Continues meds Adderall XR 20 mg every morning, Wellbutrin XL 450 every morning, clonidine 0.1 mg twice daily, lorazepam 1 mg every 6 hours as needed, olanzapine i 10 mg nightly  She still does not feel herself.  Still struggles with depression and low motivation and reduced social engagement and reduced interest and reduced emotional expression.  She is somewhat better with the medicines plus Spravato.  She still believes the [employer]  makes her blunted and is not sure how much it helps her anxiety.  She can have good days when her family is around and she is engaged.  She still wants to stop the olanzapine. She has apparently continued to take the trazodone despite having been told to stop it when she started olanzapine.  She feels like she needs the trazodone. Plan: DC olanzapine and Start nortriptyline 25 mg nightly and build up to 75 mg nightly and then check blood level.    08/27/2022 appointment noted: Received Spravato 84 mg today as scheduled.  Tolerated it well without nausea or vomiting headache or chest pain or palpitations.  Expected dissociation and feels less depressed with resolution of negative emotions immediately after Spravato and then depression, anxiety creep back in. Continues meds Adderall XR 20 mg every morning, Wellbutrin XL 450 every morning, clonidine 0.1 mg twice daily, lorazepam 1 mg every 6 hours as needed. Stopped  olanzapine and started nortriptyline which she has taken for about a week is 75 mg nightly. So far she is tolerating the nortriptyline well with the exception of some dry mouth and constipation which she is working to manage.  She does not feel substantially better better or different off the olanzapine.  No change in her sleep which is good.  Main concern currently in addition to the residual depression is anxiety which is somewhat situational with pending arch show.  She is worrying about it more than normal.  Says she is having to take lorazepam twice a day where she had been able to keep reduce it prior to this.  She still does not feel like herself with residual depression with less social interest and less of her usual buoyancy in personality.  She is flatter than normal.  Overall she still feels that the Spravato has been helpful at reducing the severity of the depression.  She is not suicidal. She has not heard anything about the TMS appeal as of yet.  09/05/2022 appointment noted: Received Spravato 84 mg today as scheduled.  Tolerated it well without nausea or vomiting headache or chest pain or palpitations.  Expected dissociation and feels less depressed with resolution of negative emotions immediately after Spravato and then depression, anxiety creep back in. Continues meds Adderall XR 20 mg every morning, Wellbutrin XL 450 every morning, clonidine 0.1 mg twice daily, lorazepam 1 mg every 6 hours as needed. Stopped olanzapine and started nortriptyline which she has taken for about 2 week is 75 mg nightly. Initially blood pressure was a little high causing delay in starting Spravato.  She admitted to feeling a little wound up.  She still experiences a little increase in depression if she goes longer than a week in between doses of Spravato.  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  09/10/22 appt noted: Received Spravato 84 mg today as scheduled.  Tolerated it well without nausea or vomiting headache or chest pain or palpitations.  Expected dissociation gradually resolved over the 2 hour observation period. She feels 50% less depressed with Spravto and wants to continue it.   Continues meds Adderall XR 20 mg every morning, Wellbutrin XL 450 every morning, clonidine 0.1 mg twice daily, lorazepam 1 mg every 6 hours as needed. Has started nortriptyline 75 mg nightly for about 3 weeks. Has not seen a significant difference with the addition of nortriptyline.  Tolerating it  pretty well. She continues to have some degree of anhedonia and significant depression and anxiety.  Her daughters noticed that she is more needy and calls more frequently.  She acknowledges this as well.  She is clearly still not herself. Plan: pramipexole off label and RX 0.25 mg BID  09/17/2022 appointment noted: Received Spravato 84 mg today as scheduled.  Tolerated it well without nausea or vomiting headache or chest pain or palpitations.  Expected dissociation gradually resolved over the 2 hour observation period. She feels 50% less depressed with Spravto and wants to continue it.   Continues meds Adderall XR 20 mg every morning, Wellbutrin XL 450 every morning, clonidine 0.1 mg twice daily, lorazepam 1 mg every 6 hours as needed. Has started nortriptyline 75 mg nightly for about 3 weeks and DT level 176, reduced to 50 mg HS early November. Still the same sx as noted last visit.  Tolerating meds.   Compliant.  Still depressed and family notices.  Has been able to participate in family interactions.  Some post-show let down and has to do detailed work which is hard for her bc ADD.  Sleep and eating well.  Energy OK but not great.  No SI.  Not cried in a year or so.  Clearly less depressed and hopeless than before the Spravato.  09/24/22 appt noted: Received Spravato 84 mg today as scheduled.   Tolerated it well without nausea or vomiting headache or chest pain or palpitations.  Expected dissociation gradually resolved over the 2 hour observation period. She feels 50% less depressed with Spravto and wants to continue it.   Continues meds Adderall XR 20 mg every morning, Wellbutrin XL 450 every morning, clonidine 0.1 mg twice daily, lorazepam 1 mg every 6 hours as needed. Has started nortriptyline 75 mg nightly for about 3 weeks and DT level 176, reduced to 50 mg HS early November. Still the same sx as noted last visit.  Tolerating meds.   Compliant.  Still depressed and family notices.  Has been able to participate in family interactions.  Some post-show let down and has to do detailed work which is hard for her bc ADD.  Sleep and eating well.  Energy OK but not great.  No SI.  Not cried in a year or so.  Clearly less depressed and hopeless than before the Spravato. Is not making further progress generally.  Stuck with moderate depression  10/02/22 appt noted: Received Spravato 84 mg today as scheduled.  Tolerated it well without nausea or vomiting headache or chest pain or palpitations.  Expected dissociation gradually resolved over the 2 hour observation period. She feels 50% less depressed with Spravto and wants to continue it.   Continues meds Adderall XR 20 mg every morning, Wellbutrin XL 450 every morning, clonidine 0.1 mg twice daily, lorazepam 1 mg every 6 hours as needed. Has started nortriptyline 75 mg nightly for about 3 weeks and DT level 176, reduced to 50 mg HS early November. Still the same sx as noted last visit.  Tolerating meds.   Compliant.  Still depressed and family notices.  Has been able to participate in family interactions.  Some post-show let down and has to do detailed work which is hard for her bc ADD.  Sleep and eating well.  Energy OK but not great.  No SI.  Not cried in a year or so.  Clearly less depressed and hopeless than before the Spravato. Is not making  further progress generally.  Stuck with  moderate depression.  Is able to function pretty normally. Plan: trial pramipexole 0.25 mg BID off label for depression.   10/08/22 appt noted: Received Spravato 84 mg today as scheduled.  Tolerated it well without nausea or vomiting headache or chest pain or palpitations.  Expected dissociation gradually resolved over the 2 hour observation period. She feels 50% less depressed with Spravto and wants to continue it.   Continues meds Adderall XR 20 mg every morning, Wellbutrin XL 450 every morning, clonidine 0.1 mg twice daily, lorazepam 1 mg every 6 hours as needed. Has started nortriptyline 75 mg nightly for about 3 weeks and DT level 176, reduced to 50 mg HS early November. Still the same sx as noted last visit.  Tolerating meds.   Compliant.  Still depressed and family notices.  Has been able to participate in family interactions.  Some post-show let down and has to do detailed work which is hard for her bc ADD.  Sleep and eating well.  Energy OK but not great.  No SI.  Not cried in a year or so.  Clearly less depressed and hopeless than before the Spravato. Is not making further progress generally.  Stuck with moderate depression.  Behaved and felt pretty normally with family over for Thanksgiving. Doesn't see benefit or SE with pramipexole but thinks maybe it makes her worse. Plan:  increase pramipexole augmentation off label to 0.5 mg BID  10/15/2022 appointment noted: Received Spravato 84 mg today as scheduled.  Tolerated it well without nausea or vomiting headache or chest pain or palpitations.  Expected dissociation gradually resolved over the 2 hour observation period. She feels 50% less depressed with Spravto and wants to continue it.   Continues meds Adderall XR 20 mg every morning, Wellbutrin XL 450 every morning, clonidine 0.2 mg twice daily, lorazepam 1 mg every 6 hours as needed.  Trazodone 50 mg tablets 1-2 nightly as needed insomnia Has  started nortriptyline 75 mg nightly for about 3 weeks and DT level 176, reduced to 50 mg HS early November. Recommended increase pramipexole 0.5 mg twice daily off label for treatment resistant depression on 10/08/2022. She feels better motivated more active with pramipexole 0.5 mg twice daily.  She still is depressed but it is better.  We discussed the possibility of going up in the dose but did not change it.  10/22/2022 appointment noted: Received Spravato 84 mg today as scheduled.  Tolerated it well without nausea or vomiting headache or chest pain or palpitations.  Expected dissociation gradually resolved over the 2 hour observation period. She feels 50% less depressed with Spravto and wants to continue it.   Continues meds Adderall XR 20 mg every morning, Wellbutrin XL 450 every morning, clonidine 0.2 mg twice daily, lorazepam 1 mg every 8 hours as needed.  Trazodone 50 mg tablets 1-2 nightly as needed insomnia Has started nortriptyline 75 mg nightly for about 3 weeks and DT level 176, reduced to 50 mg HS early November. pramipexole 0.5 mg twice daily off label for treatment resistant depression on 10/08/2022. She is better motivated than she was.  She is journaling 3 pages a day.  She has started walking and has walked 5 days a week for 50 minutes for the last 2 weeks.  That is significantly helped her mood.  Her mood tends to be better when she interacts with family.  However she still has some periods of depression.  She is tolerating the medications well.  She still feels like her affect  and confidence is not back to normal. Plan:  increase pramipexole augmentation off label to 0.5 mg tID or 0.75 mg BID   10/29/22 appt noted: Received Spravato 84 mg today as scheduled.  Tolerated it well without nausea or vomiting headache or chest pain or palpitations.  Expected dissociation gradually resolved over the 2 hour observation period. She feels 50% less depressed with Spravto and wants to  continue it.   Continues meds Adderall XR 20 mg every morning, Wellbutrin XL 450 every morning, clonidine 0.2 mg twice daily, lorazepam 1 mg every 8 hours as needed.  Trazodone 50 mg tablets 1-2 nightly as needed insomnia Has started nortriptyline 75 mg nightly for about 3 weeks and DT level 176, reduced to 50 mg HS early November. pramipexole increased to 0.75 mg twice daily off label for treatment resistant depression  She has been feeling somewhat better with the increase in pramipexole and is tolerating it well.  She still is easily overwhelmed.  Her affect and mood can improve now when around her family or doing something positive.  She has been able to be more productive. She is tolerating the current medications. She is still considering TMS as an alternative to Spravato.  11/15/2022 appointment noted: Received Spravato 84 mg today as scheduled.  Tolerated it well without nausea or vomiting headache or chest pain or palpitations.  Expected dissociation gradually resolved over the 2 hour observation period. She feels 50% less depressed with Spravto and wants to continue it.   Continues meds Adderall XR 20 mg every morning, Wellbutrin XL 450 every morning, clonidine 0.2 mg twice daily, lorazepam 1 mg every 8 hours as needed.  Trazodone 50 mg tablets 1-2 nightly as needed insomnia Has started nortriptyline 75 mg nightly for about 3 weeks and DT level 176, reduced to 50 mg HS early November. pramipexole increased to 0.75 mg twice daily off label for treatment resistant depression  He has noticed some increase in depression due to the length of time since the last Spravato administration.  She believes Spravato is helping her.  sHe is not suicidal but has felt very blue the last few days. She is tolerating the medication. She is ambivalent about Spravato versus TMS but she is considering TMS. She wants to continue Spravato administration because it is clearly helpful.  11/19/22 appt noted: Received  Spravato 84 mg today as scheduled.  Tolerated it well without nausea or vomiting headache or chest pain or palpitations.  Expected dissociation gradually resolved over the 2 hour observation period. She feels 50% less depressed with Spravto and wants to continue it.   Continues meds Adderall XR 20 mg every morning, Wellbutrin XL 450 every morning, clonidine 0.2 mg twice daily, lorazepam 1 mg every 8 hours as needed.  Trazodone 50 mg tablets 1-2 nightly as needed insomnia Has started nortriptyline 75 mg nightly for about 3 weeks and DT level 176, reduced to 50 mg HS early November. pramipexole increased to 0.75 mg twice daily off label for treatment resistant depression  She has felt better back on Spravato more regularly.  However she still has residual depression esp when alone or when wihtout activity.  Can function when needed.   Does not have her connfidence back. She plans to pursue TMS availability. Have discussed retrying Auvelity  11/28/22 appt noted: Received Spravato 84 mg today as scheduled.  Tolerated it well without nausea or vomiting headache or chest pain or palpitations.  Expected dissociation gradually resolved over the 2 hour observation period.  She feels 50% less depressed with Spravto and wants to continue it.   Continues meds Adderall XR 20 mg every morning, Wellbutrin XL 450 every morning, clonidine 0.2 mg twice daily, lorazepam 1 mg every 8 hours as needed.  Trazodone 50 mg tablets 1-2 nightly as needed insomnia Has started nortriptyline 75 mg nightly for about 3 weeks and DT level 176, reduced to 50 mg HS early November. pramipexole increased to 0.75 mg twice daily off label for treatment resistant depression  Last week she was more depressed than usual for reasons that are not clear.  She has not had a clear answer from Filomena Jungling about [employer] options.  States that they have not returned her call.  She is asked about Auvelity retry in place of Wellbutrin.  Has still been  walking.  12/03/22 appt noted: Received Spravato 84 mg today as scheduled.  Tolerated it well without nausea or vomiting headache or chest pain or palpitations.  Expected dissociation gradually resolved over the 2 hour observation period. She feels 50% less depressed with Spravto and wants to continue it.   Continues meds Adderall XR 20 mg every morning, Wellbutrin XL 450 every morning, clonidine 0.2 mg twice daily, lorazepam 1 mg every 8 hours as needed.  Trazodone 50 mg tablets 1-2 nightly as needed insomnia started nortriptyline 75 mg nightly for about 3 weeks and DT level 176, reduced to 50 mg HS early November. pramipexole increased to 0.75 mg twice daily off label for treatment resistant depression  No SE .  Satisfied with meds. Depression is better in the last week.  Not sure why that is the case.  Still walking daily and that helps and journaling 3 pages daily.  Working on Biomedical scientist.  No changes desire.  Clearly benefits from Spravato but not 100%.  Still considering TMS.  12/10/22 appt noted: Received Spravato 84 mg today as scheduled.  Tolerated it well without nausea or vomiting headache or chest pain or palpitations.  Expected dissociation gradually resolved over the 2 hour observation period. She feels 50% less depressed with Spravto and wants to continue it.   Continues meds Adderall XR 20 mg every morning, Wellbutrin XL 450 every morning, clonidine 0.2 mg twice daily, lorazepam 1 mg every 8 hours as needed.  Trazodone 50 mg tablets 1-2 nightly as needed insomnia started nortriptyline 75 mg nightly for about 3 weeks and DT level 176, reduced to 50 mg HS early November. pramipexole increased to 0.75 mg twice daily off label for treatment resistant depression  No SE .   Depression was worse this week for no apparent reason.  She struggled being positive.  She has felt more discouraged.  She has felt more anxious.  She has had a hard time doing tasks.  She is interested in retrying the  [place] which we had discussed previously. Plan: She agrees to [employer].  To improve tolerability and reduce risk of side effects, Stop Wellbutrin and start Auvelity 1 in the morning for 1 week then 1 twice daily AndReduce pramipexole 0.5 mg BID and reduce nortriptyline to 50 mg HS  12/17/22 appt noted: Received Spravato 84 mg today as scheduled.  Tolerated it well without nausea or vomiting headache or chest pain or palpitations.  Expected dissociation gradually resolved over the 2 hour observation period. She feels 50% less depressed with Spravto and wants to continue it.   Continues meds Adderall XR 20 mg every morning, stopped Wellbutrin XL 150 every morning, clonidine 0.2 mg twice daily,  lorazepam 1 mg every 8 hours as needed.  Trazodone 50 mg tablets 1-2 nightly as needed insomnia Has started nortriptyline 75 mg nightly for about 3 weeks and DT level 176, reduced to 50 mg HS early November. pramipexole 0.375 mg twice daily off label for treatment resistant depression with plan to stop Started Auvelity 1 AM Still depressed to moderate degree.  Tolerating Auvelity so far.  No other problems with meds.  Willing to give Auvelity a chance. Plan: She agrees to [employer].  Continue 1 AM and when tolerated then increase to BID Stop pramipexole.  12/26/22 received Spravato  01/01/23 appt noted: Received Spravato 84 mg today as scheduled.  Tolerated it well without nausea or vomiting headache or chest pain or palpitations.  Expected dissociation gradually resolved over the 2 hour observation period. She feels 50% less depressed with Spravto and wants to continue it.   Continues meds Adderall XR 20 mg every morning, stopped Wellbutrin XL 150 every morning, clonidine 0.2 mg twice daily, lorazepam 1 mg every 8 hours as needed.  Trazodone 50 mg tablets 1-2 nightly as needed insomnia Has started nortriptyline 75 mg nightly for about 3 weeks and DT level 176, reduced to 50 mg HS early  November. Forgot to reduce pramipexole stoll taking  0.5 mg twice daily off label for treatment resistant depression  Started Auvelity 1 AM & PM last week. Occ misses Spravato DT htn.this week a better than last.   Painting again more and it helps mood.  Confidence still low and not as likely to socialize as normal but enjoys family.   Still ambivalent about TMS. Tolerating meds.  Including the increase in [place].    01/08/23 appt noted: Received Spravato 84 mg today as scheduled.  Tolerated it well without nausea or vomiting headache or chest pain or palpitations.  Expected dissociation gradually resolved over the 2 hour observation period. She feels 50% less depressed with Spravto and wants to continue it.   Continues meds Adderall XR 20 mg every morning, stopped Wellbutrin XL 150 every morning, clonidine 0.2 mg twice daily, lorazepam 1 mg every 8 hours as needed.  Trazodone 50 mg tablets 1-2 nightly as needed insomnia Has started nortriptyline 75 mg nightly for about 3 weeks and DT level 176, reduced to 50 mg HS early November. reduced pramipexole to 0.5 mg daily off label for treatment resistant depression  Started Auvelity 1 AM & PM mid Feb. More depressed as week progresses.  Thinks it is worse with less pramipexole.  More negative.  Able to function but feels miserable.  No SI.  Hopeless.  Sleep ok.  Tolerating meds.   Talked to [facility] about TMS and appt mid March. Plan: increase pramipexole to 0.5 mg TID for dep bc seemed to worsen with the reduction.  01/15/23 appt noted: Received Spravato 84 mg today as scheduled.  Tolerated it well without nausea or vomiting headache or chest pain or palpitations.  Expected dissociation gradually resolved over the 2 hour observation period. She feels 50% less depressed with Spravto and wants to continue it.   Continues meds Adderall XR 20 mg every morning, stopped Wellbutrin XL 150 every morning, clonidine 0.2 mg twice daily, lorazepam 1 mg every 8  hours as needed.  Trazodone 50 mg tablets 1-2 nightly as needed insomnia Has started nortriptyline 75 mg nightly for about 3 weeks and DT level 176, reduced to 50 mg HS early November. Increased pramipexole to 0.5 mg  to TID  off label for  treatment resistant depression  Started Auvelity 1 AM & PM mid Feb. markedly better depression the increase in pramipexole.  She feels like her depression is almost resolved.  She is very pleased with the response.  She is tolerating the medications well  01/30/23 appt noted: Received Spravato 84 mg today as scheduled.  Tolerated it well without nausea or vomiting headache or chest pain or palpitations.  Expected dissociation gradually resolved over the 2 hour observation period. She feels 50% less depressed with Spravto and wants to continue it.   Continues meds Adderall XR 20 mg every morning, stopped Wellbutrin XL 150 every morning, clonidine 0.2 mg twice daily, lorazepam 1 mg every 8 hours as needed.  Trazodone 50 mg tablets 1-2 nightly as needed insomnia Has started nortriptyline 75 mg nightly for about 3 weeks and DT level 176, reduced to 50 mg HS early November. Increased pramipexole to 0.5 mg  to TID  off label for treatment resistant depression  Started Auvelity 1 AM & PM mid Feb. Mood markedly better with pramipexole added.  Nearly normal mood now with minimal depression.  More social and outgoing and motivated and resolved anhedonia. No SE.  Compliant.  02/04/23 appt noted: Continues meds Adderall XR 20 mg every morning, stopped Wellbutrin XL 150 every morning, clonidine 0.2 mg twice daily, lorazepam 1 mg every 8 hours as needed.  Trazodone 50 mg tablets 1-2 nightly as needed insomnia Has started nortriptyline 75 mg nightly for about 3 weeks and DT level 176, reduced to 50 mg HS early November. Increased pramipexole to 0.5 mg  to TID  off label for treatment resistant depression  Started Auvelity 1 AM & PM mid Feb. Received Spravato 84 mg today as  scheduled.  Tolerated it well without nausea or vomiting headache or chest pain or palpitations.  Expected dissociation gradually resolved over the 2 hour observation period. She feels 50% less depressed with Spravto and wants to continue it.  The pramipexole got her the rest of the way better.  Doesn't want to cut back on any tx bc afraid of relapse. Tolerating meds.  02/15/23 appt noted: Continues meds Adderall XR 20 mg every morning, stopped Wellbutrin XL 150 every morning, clonidine 0.2 mg twice daily, lorazepam 1 mg every 8 hours as needed.  Trazodone 50 mg tablets 1-2 nightly as needed insomnia Has started nortriptyline 75 mg nightly for about 3 weeks and DT level 176, reduced to 50 mg HS early November. Increased pramipexole to 0.5 mg  to TID  off label for treatment resistant depression  Started Auvelity 1 AM & PM mid Feb. Received Spravato 84 mg today as scheduled.  Tolerated it well without nausea or vomiting headache or chest pain or palpitations.  Expected dissociation gradually resolved over the 2 hour observation period. She feels no longer depressed.   The pramipexole got her the rest of the way better.  Doesn't want to cut back on any tx bc afraid of relapse. Tolerating meds.  02/18/23 appt noted: Continues meds Adderall XR 20 mg every morning, stopped Wellbutrin XL 150 every morning, clonidine 0.2 mg twice daily, lorazepam 1 mg every 8 hours as needed.  Trazodone 50 mg tablets 1-2 nightly as needed insomnia Has started nortriptyline 75 mg nightly for about 3 weeks and DT level 176, reduced to 50 mg HS early November. Increased pramipexole to 0.5 mg  to TID  off label for treatment resistant depression  Started Auvelity 1 AM & PM mid Feb. Received Spravato 84 mg  today as scheduled.  Tolerated it well without nausea or vomiting headache or chest pain or palpitations.  Expected dissociation gradually resolved over the 2 hour observation period. She feels no longer depressed except when  ran out of Auvelity this week DT lack of availability.  Much worse without it..   The pramipexole got her the rest of the way better.  Doesn't want to cut back on any tx bc afraid of relapse. Tolerating meds.  02/25/23 appt: Received Spravato 84 mg today as scheduled.  Tolerated it well without nausea or vomiting headache or chest pain or palpitations.  Expected dissociation gradually resolved over the 2 hour observation period. Continues meds Adderall XR 20 mg every morning, stopped Wellbutrin XL 150 every morning, clonidine 0.2 mg twice daily, lorazepam 1 mg every 8 hours as needed.  Trazodone 50 mg tablets 1-2 nightly as needed insomnia Has started nortriptyline 75 mg nightly for about 3 weeks and DT level 176, reduced to 50 mg HS early November. Increased pramipexole to 0.5 mg  to TID  off label for treatment resistant depression  Started Auvelity 1 AM & PM mid Feb. No SE Still dramatically better with meds and Spravato.  Not 100% over depression. Had GS born last week and able to enjoy it now.  Is a little sleepy with pramipexole but manageable.  No change desired.  Sleep is ok.  03/04/23 appt noted: Received Spravato 84 mg today as scheduled.  Tolerated it well without nausea or vomiting headache or chest pain or palpitations.  Expected dissociation gradually resolved over the 2 hour observation period. Continues meds Adderall XR 20 mg every morning, stopped Wellbutrin XL 150 every morning, clonidine 0.2 mg twice daily, lorazepam 1 mg every 8 hours as needed.  Trazodone 50 mg tablets 1-2 nightly as needed insomnia nortriptyline reduced to 50 mg HS early November. Increased pramipexole to 0.5 mg  to TID  off label for treatment resistant depression  Started Auvelity 1 AM & PM mid Feb. Still generally doing well with meds and Spravato except when got tooth abscess.  Freels more depressed after dental surgery.   No SE with meds.   Sleep is OK.  No med changes desired  03/11/23 appt  noted: Received Spravato 84 mg today as scheduled.  Tolerated it well without nausea or vomiting headache or chest pain or palpitations.  Expected dissociation gradually resolved over the 2 hour observation period. Continues meds Adderall XR 20 mg every morning, stopped Wellbutrin XL 150 every morning, clonidine 0.2 mg twice daily, lorazepam 1 mg every 8 hours as needed.  Trazodone 50 mg tablets 1-2 nightly as needed insomnia nortriptyline reduced to 50 mg HS early November. Increased pramipexole to 0.5 mg  to TID  off label for treatment resistant depression  Started Auvelity 1 AM & PM mid Feb. Some increase depression with infection and needing amoxiciliin this week.  Otherwie still doing well with mood and meds. No med changes desired or indicated. No SE  03/21/2023 appointment noted: Continues meds Adderall XR 20 mg every morning, stopped Wellbutrin XL 150 every morning, clonidine 0.2 mg twice daily, lorazepam 1 mg every 8 hours as needed.  Trazodone 50 mg tablets 1-2 nightly as needed insomnia nortriptyline reduced to 50 mg HS early November. Increased pramipexole to 0.5 mg  to TID  off label for treatment resistant depression  Started Auvelity 1 AM & PM mid Feb. Received Spravato 84 mg today as scheduled.  Tolerated it well without nausea or vomiting headache  or chest pain or palpitations.  Expected dissociation gradually resolved over the 2 hour observation period. Depression is much better.  Pretty much resolved this week.  She feels the Auvelity and pramipexole have made the biggest difference.  Obviously the Spravato is also helping significantly.  She wants to continue all of these medications.  She still has anxiety easily.  Especially facing something that she would rather avoid.  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 Tolerating meds without significant side effects. Plan no med changes  03/29/23 appt  noted: Continues meds Adderall XR 20 mg every morning, stopped Wellbutrin XL 150 every morning, clonidine 0.2 mg twice daily, lorazepam 1 mg every 8 hours as needed.  Trazodone 50 mg tablets 1-2 nightly as needed insomnia, nortriptyline 50 mg HS,  pramipexole 0.5 mg  to TID  off label for treatment resistant depression  Started Auvelity 1 AM & PM mid Feb 2024 No SE Received Spravato 84 mg today as scheduled.  Tolerated it well without nausea or vomiting headache or chest pain or palpitations.  Expected dissociation gradually resolved over the 2 hour observation period. Depression continues to improve to a point of very mild sx.  Still hasn't gotten her confidence fully back.  She does enjoy social interactions and is productive at work. She is tolerating the meds well without side effects.  We discussed the possibility of reducing some of the medication given the marked response which was positive with pramipexole.  04/04/23 appt noted: Continues meds Adderall XR 20 mg every morning, stopped Wellbutrin XL 150 every morning, clonidine 0.2 mg twice daily, lorazepam 1 mg every 8 hours as needed.  Trazodone 50 mg tablets 1-2 nightly as needed insomnia, nortriptyline 50 mg HS,  pramipexole 0.5 mg  to TID  off label for treatment resistant depression  Started Auvelity 1 AM & PM mid Feb 2024 No SE Received Spravato 84 mg today as scheduled.  Tolerated it well without nausea or vomiting headache or chest pain or palpitations.  Expected dissociation gradually resolved over the 2 hour observation period. Dep is still much improved with increase in pramipexole.  Largely resolved.  Still gets anxious but takes less lorazepam prn but still needs it.  Sleep ok.  Tolerating meds.  04/10/23 appt noted: Continues meds Adderall XR 20 mg every morning, clonidine 0.2 mg twice daily, lorazepam 1 mg every 8 hours as needed.  Trazodone 50 mg tablets 1-2 nightly as needed insomnia, nortriptyline 50 mg HS,  pramipexole 0.5 mg   to TID  off label for treatment resistant depression  Started Auvelity 1 AM & PM mid Feb 2024 No SE.  Satisfied with meds.   Received Spravato 84 mg today as scheduled.  Tolerated it well without nausea or vomiting headache or chest pain or palpitations.  Expected dissociation gradually resolved over the 2 hour observation period. Dep is still much improved with increase in pramipexole.  Largely resolved.  Still gets anxious but takes less lorazepam prn but still needs it.  Sleep ok.  Tolerating meds. Did an art show in another town; I couldn't have done this a year ago.    05/07/23 appt noted: Continues meds Adderall XR 20 mg every morning, clonidine 0.2 mg twice daily, lorazepam 1 mg every 8 hours as needed.  Trazodone 50 mg tablets 1-2 nightly as needed insomnia, nortriptyline 25-50 mg HS,  pramipexole 0.5 mg  to TID  off label for treatment resistant depression  Started Auvelity 1 AM & PM mid Feb 2024 No SE.  Satisfied with meds.   Received Spravato 84 mg today as scheduled.  Tolerated it well without nausea or vomiting headache or chest pain or palpitations.  Expected dissociation gradually resolved over the 2 hour observation period. H says she is her old self.  Dep so much better.  Painting going well.  Anxiety in AM and then better.  Wakes with it.  Structured days more.  Negative self talk gone.  Wonderful to have relief.  Good weekend at a wedding.  Enjoys GS; [place].  More outspoken since the dep but not over the top.   More interest overall and satisfied wth resp.  Agrees to wean nortriptyline but doesn't want other med changes.  05/15/23 received Spravato 84  05/27/23 appt noted: Continues meds Adderall XR 20 mg every morning, clonidine 0.2 mg twice daily, lorazepam 1 mg every 8 hours as needed.  Trazodone 50 mg tablets 1-2 nightly as needed insomnia, nortriptyline 25-50 mg HS,  pramipexole 0.5 mg  to TID  off label for treatment resistant depression  Started Auvelity 1 AM & PM mid Feb  2024 No SE.  Satisfied with meds.   Received Spravato 84 mg today as scheduled.  Tolerated it well without nausea or vomiting headache or chest pain or palpitations.  Expected dissociation gradually resolved over the 2 hour observation period. Maybe a little more anxious re: weaning off nortriptyline.  Busy summer.  Extremely prolific painting.   Has a pending show and stressed over preparing.   Enjoys [employer].  Kept him yesterday for a couple of hours.   Dep has been like a cloak with physical sensation for a long time but is much better than it was.  Still not 100% all the time.  Struggles with some chronic guilt issues.  Self talk and CBT on herself yesterday had some. Still some fear about travelling and demands but so much better.   Making people laugh again and socially engaged and H agrees so much better. M has been impossible.  WC and immobile.  She wastes money.       ECT-MADRS    Flowsheet Row Clinical Support from 01/08/2023 in [facility] Clinical Support from 08/06/2022 in [facility] Clinical Support from 07/04/2022 in [facility] Clinical Support from 05/21/2022 in [facility] Office Visit from 03/02/2022 in [facility]  MADRS Total Score 21 29 15 27  46      Past Psychiatric Medication Trials: fluoxetine, duloxetine, Viibryd, Pristiq, sertraline, citalopram,  Trintellix anxious and SI Wellbutrin XL 450 Auvelity 1 dose nortriptyline 75 mg nightly for about 3 weeks and DT level 176, reduced to 50 mg HS NR. Pramipexole 1 mg NR Adderall, Adderall XR, Vyvanse, Ritalin, Strattera low dose NR Lorazepam Trazodone  Depakote,  lamotrigine cog complaints Lithium remotely Abilify 7.5  Vraylar 1.5 mg daily agitation and insomnia Rexulti insomnia Latuda 40 one dose, CO anxious and SI Seroquel XR 300 Olanzapine 10  At visit November 12, 2019. We discussed Patient developed an increasingly severe alcohol dependence problem since her last visit in January.  She went to [facility] and has had no alcohol since then except 1 day.  She never abused stimulants but they took her off the stimulants at [facility].  Her ADD was markedly worse.  The Wellbutrin did not help  the ADD.   D history lamotrigine rash at 67 yo  Review of Systems:  Review of Systems  Constitutional:  Negative for fatigue.  Musculoskeletal:  Positive for back pain. Negative for arthralgias and joint swelling.       SP hip surgery October 2020  Neurological:  Negative for dizziness and tremors.  Psychiatric/Behavioral:  Positive for decreased concentration and dysphoric mood. Negative for agitation, behavioral problems, confusion, hallucinations, self-injury, sleep disturbance and suicidal ideas. The patient is nervous/anxious. The patient is not hyperactive.        Forgetful at times about med recommendations.    Medications: I have reviewed the patient's current medications.  Current Outpatient Medications  Medication Sig Dispense Refill   amLODipine (NORVASC) 2.5 MG tablet Take 2.5 mg by mouth daily.     amphetamine-dextroamphetamine (ADDERALL XR) 20 MG 24 hr capsule Take 1 capsule (20 mg total) by mouth every morning. 30 capsule 0   [START ON 06/04/2023] amphetamine-dextroamphetamine (ADDERALL XR) 20 MG 24 hr capsule Take 1 capsule (20 mg total) by mouth every morning. 30 capsule 0   cloNIDine (CATAPRES) 0.2 MG tablet TAKE 1 TABLET BY MOUTH TWICE A DAY 180 tablet 0   Dextromethorphan-buPROPion ER (AUVELITY) 45-105 MG TBCR Take 1 tablet by mouth 2 (two) times daily. 60 tablet 3   Esketamine HCl, 84 MG Dose, (SPRAVATO, 84 MG DOSE,) 28 MG/DEVICE SOPK USE 3 SPRAYS IN EACH NOSTRIL ONCE A WEEK 3 each 2   iron polysaccharides (NIFEREX) 150 MG capsule TAKE 1 CAPSULE BY MOUTH EVERY DAY 90 capsule 1   LORazepam (ATIVAN) 1 MG tablet Take 1 tablet (1 mg  total) by mouth every 8 (eight) hours as needed. for anxiety 90 tablet 1   losartan (COZAAR) 50 MG tablet Take 50 mg by mouth daily.     nebivolol (BYSTOLIC) 2.5 MG tablet Take 2.5 mg by mouth daily.     pramipexole (MIRAPEX) 0.5 MG tablet Take 1 tablet (0.5 mg total) by mouth 3 (three) times daily. 90 tablet 1   traZODone (DESYREL) 50 MG tablet TAKE 1-2 TABLETS BY MOUTH NIGHTLY AS NEEDED FOR SLEEP 180 tablet 1   No current facility-administered medications for this visit.    Medication Side Effects: None  Allergies:  Allergies  Allergen Reactions   Metronidazole Shortness Of Breath and Other (See Comments)    Heart pounding   Ferrlecit [Na Ferric Gluc Cplx In Sucrose] Other (See Comments)    Infusion reaction 05/12/2019    Past Medical History:  Diagnosis Date   ADHD    Anemia    Anxiety    Arthritis    Depression    Heart murmur    i went to see a cardiologit slast eyar  and i had zero plaque,    PONV (postoperative nausea and vomiting)    Recovering alcoholic in remission (HCC)     Family History  Problem Relation Age of Onset   Atrial fibrillation Mother    CAD Father     Past Medical History, Surgical history, Social history, and Family history were reviewed and updated as appropriate.   Please see review of systems for further details on the patient's review from today.   Objective:   Physical Exam:  There were no vitals taken for this visit.  Physical Exam Constitutional:      General: She is not in acute distress. Neurological:     Mental Status: She is alert and oriented to person, place, and time.  Coordination: Coordination normal.     Gait: Gait normal.  Psychiatric:        Attention and Perception: Attention and perception normal.        Mood and Affect: Mood is anxious and depressed.        Speech: Speech is not rapid and pressured.        Behavior: Behavior is not slowed or hyperactive.        Thought Content: Thought content is not  paranoid or delusional. Thought content does not include homicidal or suicidal ideation. Thought content does not include suicidal plan.        Cognition and Memory: Cognition normal. Memory is not impaired.        Judgment: Judgment normal.     Comments: Insight intact. No auditory or visual hallucinations. No delusions.  Depression resolved with pramipexole TID and Auvelity generally. Very mild sx of each left. No manic sx     Lab Review:     Component Value Date/Time   NA 137 01/12/2021 1430   NA 140 11/18/2018 1544   K 3.8 01/12/2021 1430   CL 108 01/12/2021 1430   CO2 22 01/12/2021 1430   GLUCOSE 94 01/12/2021 1430   BUN 14 01/12/2021 1430   BUN 20 11/18/2018 1544   CREATININE 0.82 01/12/2021 1430   CALCIUM 8.9 01/12/2021 1430   PROT 6.6 01/12/2021 1430   ALBUMIN 3.9 01/12/2021 1430   AST 12 (L) 01/12/2021 1430   ALT 11 01/12/2021 1430   ALKPHOS 46 01/12/2021 1430   BILITOT 0.5 01/12/2021 1430   GFRNONAA >60 01/12/2021 1430   GFRAA >60 09/02/2019 0249   GFRAA >60 01/27/2019 0811       Component Value Date/Time   WBC 4.5 01/12/2021 1430   RBC 4.32 01/12/2021 1430   HGB 12.8 01/12/2021 1430   HGB 12.9 07/17/2019 0953   HCT 38.5 01/12/2021 1430   HCT 21.9 (L) 12/25/2018 1221   PLT 272 01/12/2021 1430   PLT 286 07/17/2019 0953   MCV 89.1 01/12/2021 1430   MCH 29.6 01/12/2021 1430   MCHC 33.2 01/12/2021 1430   RDW 12.4 01/12/2021 1430   LYMPHSABS 1.4 01/12/2021 1430   MONOABS 0.4 01/12/2021 1430   EOSABS 0.0 01/12/2021 1430   BASOSABS 0.0 01/12/2021 1430    No results found for: "POCLITH", "LITHIUM"   No results found for: "PHENYTOIN", "PHENOBARB", "VALPROATE", "CBMZ"   .res Assessment: Plan:    Recurrent major depression resistant to treatment (HCC)  Insomnia due to mental condition  Generalized anxiety disorder  Attention deficit hyperactivity disorder (ADHD), predominantly inattentive type   She has treatment resistant major depression ongoing  with 50% better with Spravato. Have  discussed some of her  abnormal behaviors last year leading to this depressive episode getting worse which she says were associated with heavy use of delta 8 and not a manic episode.  She realizes now that that was not good for her.  She stopped all use of other drugs including those available over-the-counter such as delta 8 or any other THC related products.  She is no longer having any of those types of behaviors and instead is depressed.  She is experiencing more pleasure and is less blunted and enjoying more and better inteest versus before the Spravato.  She also feels worse if misses a dose of Spravato.  Depression nearly resolved with pramipexole TID and relapsed when out of Auvelity.  Depression resolved recently after increasing pramipexole 0.5 mg TID, brief  relapse when out of Auvelity Consider switch to ER to reduce sleepiness  Patient was administered Spravato 84 mg intranasally today.  The patient experienced the typical dissociation which gradually resolved over the 2-hour period of observation.  There were no complications.  Specifically the patient did not have nausea or vomiting or headache.  Blood pressures monitored at the 40-minute and 2-hour follow-up intervals.  Borderline high.  By the time the 2-hour observation period was met the patient was alert and oriented and able to exit without assistance.  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  See nursing note for further details.She wants to continue Spravato. We discussed discussed the side effects in detail as well as the protocol required to receive Spravato.    has failed all major categories of antidepressants except TCAs and MAO inhibitors which have not been tried until now.  Weaned off nortriptyline without much problems.  She is tolerating Auvelity.  Continue 1  BID bc more depressed without it  dramatically.  Relapsed witout it.  Worse with less pramipexole. Increased to 0.5 mg TID on about end of Feb 2024 and markedly better Dosing range off label for depression ranges from 1-5 mg daily.  Disc risk compulsive impulsive behavior and mania.    Started Spravato 84 mg twice weekly on 03/16/2022.  Wants to continue weekly and feels she needs it.   Disc risk relapse if less than weekly and she doesn't seem to do well with less than weekily.  Adderall  XR 20 mg AM   Ativan 1 mg 3 times daily as needed anxiety but try to cut it back.  She still feels she needs it. Is not ideal to use benzodiazepine with stimulant but because of the severity of her symptoms it has been necessary.  Hope to eventually eliminate the benzodiazepine.  Expected as her depression improves her anxiety will improve as well.   We will expect that to improve as the depression improves.  She has headed insturctions to reduce this.  Continue clonidine 0.2 mg BID off label for anxiety and helps BP partially. BP is better controlled but not consistent.  Consider increasing amlodipine.  Also on losartan 50  Discussed potential benefits, risks, and side effects of stimulants with patient to include increased heart rate, palpitations, insomnia, increased anxiety, increased irritability, or decreased appetite.  Instructed patient to contact office if experiencing any significant tolerability issues. She wants to return to usual dose of Adderall for ADD bc of mor poor cognitive function with reduction.  Also discussed that depression will impair cognitive function.  Disc risk polypharmacy.  Reevaluate meds after better with pramipexole.  Relapsed after running out of Auvelity so needs clearly it and pramipexole.  Give it more time.  But consider trying to wean nortriptyline in hopes she can do as well with less medication.  Rec keep track of BP and discuss with PCP. It has been up and down.  Sometimes needs extra clonidine before  Spravato.  Better lately.  She continues walkly daily with benefit.  Has Maintained sobriety.    No med changes indicated today: Continue Adderall XR 20 mg every morning Continue Auvelity 1 tablet twice daily,  continue clonidine 0.2 mg twice daily for anxiety and blood pressure. Continue lorazepam 1 mg 3 times daily she has been unable to cut back on the dose due to anxiety. Off  nortriptyline 50 mg nightly Continue pramipexole 0.5 mg  3 times daily Continue trazodone 50 mg tablets 1-2 nightly as needed for sleep.  FU with Spravato weekly   Meredith Staggers, MD, DFAPA     Please see After Visit Summary for patient specific instructions.  No future appointments.                   No orders of the defined types were placed in this encounter.      -------------------------------

## 2023-05-27 NOTE — Progress Notes (Signed)
NURSES NOTE:   Patient arrived for her weekly 84mg  Spravato treatment. Pt is being treated for Treatment Resistant Depression this is #68 treatment, pt will be receiving 84 mg which will continue to be her maintenance dose, she receives weekly treatments.  Patient arrived and taken to treatment room. Confirmed she had a ride home which is her husband would be coming back to pick up pt when done and sometimes she needs to use [facility] Goad if he is unable to pick her up. Pt's Spravato is ordered through [employer] and delivered to office, all Spravato medication is stored at doctors office per REMS/FDA guidelines. The medication is required to be locked behind two doors per FDA/REMS Protocol. Medication is also disposed of properly per regulations. Pt's prior authorization for Spravato 84 mg was renewed with an approval through 07/17/2023 with [facility]. All treatments with vital signs documented with Spravato REMS per protocol of being a treatment center.    Pt reports feeling very good, she has a new grand baby, she is painting more. Pt is doing extremely well. Very talkative today. Began taking patient's vital signs at 9:40 AM 111/71, pulse 55. Pulse Ox 99%. Gave patient first dose 28 mg nasal spray, each nasal spray administered in each nostril as directed and waited 5 minutes between the second and third dose. All 3 doses given pt did not complain of any nausea/vomiting, given a cup of water due to the taste after the administration of Spravato.  She listens to [employer] with spa or relaxing music. Pt is not sedate today, and her vital signs are stable. Checked 40 minute vitals at 10:25 AM, 92/62, pulse 47, Pulse Ox 92%. Explained she would be monitored for a total time of 120 minutes. Discharge vitals were taken at 11:30 PM 95/69 P 50, 99% Pulse Ox. Dr. Jennelle Human met with pt and discussed her treatment. Recommend she go home and sleep or just relax on the couch. No driving, no intense activities. Verbalized  understanding. Nurse was with pt a total of 70 minutes for clinical. Pt will be scheduled, next Monday, July 22nd.. Pt instructed to call office with any problems or questions.      LOT [id number] EXP APR 2027

## 2023-06-03 ENCOUNTER — Ambulatory Visit: Payer: 59

## 2023-06-03 ENCOUNTER — Ambulatory Visit (INDEPENDENT_AMBULATORY_CARE_PROVIDER_SITE_OTHER): Payer: 59 | Admitting: Psychiatry

## 2023-06-03 VITALS — BP 146/95 | HR 54

## 2023-06-03 DIAGNOSIS — F411 Generalized anxiety disorder: Secondary | ICD-10-CM

## 2023-06-03 DIAGNOSIS — I10 Essential (primary) hypertension: Secondary | ICD-10-CM

## 2023-06-03 DIAGNOSIS — F900 Attention-deficit hyperactivity disorder, predominantly inattentive type: Secondary | ICD-10-CM

## 2023-06-03 DIAGNOSIS — F339 Major depressive disorder, recurrent, unspecified: Secondary | ICD-10-CM | POA: Diagnosis not present

## 2023-06-03 DIAGNOSIS — F5105 Insomnia due to other mental disorder: Secondary | ICD-10-CM

## 2023-06-03 NOTE — Progress Notes (Signed)
PAIZLIE KLAUS [id number] 03-05-1956 67 y.o.  Subjective:   Patient ID:  KENYONA RENA is a 67 y.o. (DOB June 10, 1956) female.  Chief Complaint:  Chief Complaint  Patient presents with   Follow-up   Depression   Anxiety   ADD   Sleeping Problem      Tajanay D Calayah Guadarrama presents to the office today for follow-up of depression and anxiety and ADD.  seen November 12, 2019.  Melted down in 2020.  Went to [facility] in July.  No withdrawal.  1 drink since then.  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 She was on no stimulant and no SSRI but was taking Strattera and Wellbutrin.  The following changes were made. Stop Strattera. OK restart stimulant bc severe ADD Restart Adderall 1 daily for a few days and if tolerated then restart 1 twice daily. If not tolerated reduce the dosage if needed. May need to stop Wellbutrin if not tolerating the stimulant.  Yes.  DC Wellbutrin Restart Prozac 20 mg daily.  February 2021 appointment with the following noted: Completed grant proposal.  Couldn't doit without Adderall.  Sold a bunch of work.   Adderall XR lasts about 3 pm.  Strength seems about right.  BP been OK.  Not jittery.   Stopped Wellbutrin but had no SE. Mood drastically better with grant proposal and back on fluoxetine.  Less depressed and lethargic.  No anxiety.  Cut back on coffee. Started back with devotions and stronger faith. Plan: Continue Prozac 20 mg daily. May have to increase the dose at some point in the future given that she usually was taking higher dosages but she is getting good response at this time. Restart Wellbutrin off label for ADD since can't get 2 ADDERALL daily. 150 mg daily then 300 mg daily. She can adjust the dose between 150 mg and 300 mg daily to get the optimal effect.   05/11/2020 appointment with the following noted: Has been inconsistent with Prozac and Wellbutrin. Not sure of the effect of Wellbutrin. Biggest deterrent in  work is anxiety.  Some of the work is conceptual and difficult at times.  Can feel she's not up to a project at times.  Overall is OK but would like a steadier benefit from stimulant.  Exhausted from managing concentration and keeping up with things from the day.  Loses things.  Not good keeping up with schedule. Overall productive and emotionally OK. Can feel Adderall wear off. Mood is better in summer and worse in the winter.   F died in 09-19-2023 and that is a loss. No SE Wellbutrin. Still attends AA meetings.  Real benefit from [facility] last year. Recognizes effect of anemia on ADD and mood.  Had iron infusions last winter. Plan:  Wellbutrin off label for ADD since can't get 2 ADDERALL daily. 150 mg daily then 300 mg daily.  01/24/2021 appointment with following noted: Doing a program called [employer] mindfulness app since Xmas.  CBT app helped the depression.  App helped her focus better.  Lost sign weight. Writing a lot. Before Xmas felt depressed and started negative thinking worse, self denigrating. Not drinking. More isolated.   Recognizes mo is narcissist.    Didn't tell anyone she was born until 3 mos later.  M aloof and uninterested in pt.  Lied about her birthday.  Mo lack of affection even with pt's kids. Going to AA for a year and it helped her to quit drinking. Also  misses kids being gone with a hole also. Plan: No med changes  05/04/2021 appointment with the following noted: Therapist [name] thinks she's manic. Lost weight to 144#.   States she is still sleeping okay.  Admits she is hyper and recognizes that she is likely manic.  She feels great, euphoric with an increased sense of spiritual connectedness to God.  She has racing thoughts and talks fast and talks a lot and this is noted by her husband.  He thinks she is a bit hyper.  She has been able to maintain sobriety although she will have 1 glass of wine on special occasions but does not drink by herself.  She  is not drinking to excess.  She denies any dangerous impulsivity.  She is clearly not depressed and not particularly anxious.  She has no concerns about her medication and she has been compliant.  06/16/21 appt noted: So much better.  Going through a lot but the manic thing happened on top of it.  So much slower.  Didn't feel like losing anything with risperidone.  Likes the Adderalll at 10 mg. Some drowsiness in the AM and very drowsy from risperidone 2 mg HS. Prayer life is better. Handling stress better. Less depressed with risperidone. Still likes trazodone. Sleeps well. Plan: Reduce Prozac to 10 mg daily.  Consider stopping it because it can feel the mania however she is reluctant to do that because she fears relapse of depression. Reduce risperidone to 1.5 mg nightly due to side effects.  Discussed risk of worsening mania.  07/25/2021 appointment with the following noted: Misses the Adderall and hard to function without it. Depressed now. Heavy chest.  Anxious and guilty.  Body feels heavy.   Hates Wellbutrin.   Plan: Increase fluoxetine to 20 mg daily Add Abilify 1/2 of 15 mg tablet daily Wean wellbutrin by 1 tablet each week  bc she feels it is not helpful and DT polypharmacy Reduce risperidone to 1 daily for 1 week and stop it. Disc risk of mania. Increase Adderall to XR 20 mg AM  08/08/21 Much less depressed and starting to feel normal I feel a lot better. No SE.  Speech normal off risperidone. Sleeping OK on trazaodone and enough.   Noticed benefit from Adderall again. Plan: continue fluoxetine to 20 mg daily Continue Abilify 1/2 of 15 mg tablet daily for depression and mania continue Adderall to XR 20 mg AM  10/10/2021 phone call: Pt stated she feels like the Abilify should be decreased to 5mg .She said she is depressed but rational and not suicidal.She has an appt Monday and can wait until then if you prefer. MD response: Reduce the Abilify to 7.5 mg every other day.  We will  meet on 10/16/2021 and decide what to do from there.  10/16/2021 appointment with the following noted: More depressed.  Most depressed I've ever been.  Just numb.  Sense of grief.   Thinks the manic episode was unlike anything else she ever had.  Doesn't want to medicate against it.  Don't enjoy people.  Easily overwhelmed.  Had some death thoughts but not suicidal.  Has been functional.  Feels better today after reducing Abilify to every other day but she is only been doing that for 3 days. A/P: Episode of post manic depression was explained. continue fluoxetine to 20 mg daily Hold Abilify for 1 week then resume Abilify 1/2 of 15 mg tablet every other day for depression and mania continue Adderall to XR 20  mg AM  10/27/2021 appointment with the following noted: I'm doing so much better.  Handling the depression better. Better self talk and spiritual focus has helped.   Dep 6/10 manifesting as anxiety with low confidence.   F died 2  years ago and M 67 yo and is dependent . She is working hard to feel better but still feels depressed.  She almost feels like she has a little more anxiety since restarting Abilify every other day. Plan: continue fluoxetine to 20 mg daily DC Abilify .  Vrayalar 1.5 mg QOD to try to get rid of depression ASAP. continue Adderall to XR 20 mg AM  11/10/2021 appointment with the following noted: Busy with Xmas and it was fun with family but then a big let down.  Did well with it.  Functioned well with it.  Working hard on things with depression.  Not shutting down. Not sure but feels better today but yesterday was hard.  Difficulty dealing with mother.  She won't do anything to help herself.  Yesterday with her all day.  Won't do PT and has isolated herself.    Lack of confidence.   No SE with Vraylar.  11/24/21 urgent appointment appt noted: More and more depressed.   So anxious and doesn't want to be alone but can do so. No appetite. Hurts inside. Has had some  fleeting suicidal thoughts but would not act on them.  Tolerating meds. Has been consistent with Vraylar 1.5 mg every other day, fluoxetine 20 mg daily Plan: Increase Vraylar to 1.5 mg daily Change Prozac to Trintellix 10 mg daily. Discussed side effects of each continue Adderall to XR 20 mg AM  12/27/2021 appointment with the following noted: Not OK.  I feel less depressed but feels bat shit. Not sleeping well.  Extremely anxious. Off and on sleep. 3-4 hours of sleep.   Still having daily SI.  But also become obvious has so much to do.  Overwhelmed by tasks.   Needs anxiety meds to just function. Not more motivated.  Walked yesterday.   Feels afraid like in trouble but not irritable or angry. DC DT agitation Vraylar to 1.5 mg daily Change Prozac to Trintellix 10 mg daily. Hold Adderall to XR 20 mg AM Clonidine 0.1 1/2 tablet twice daily for 2 days and if needed for anxiety and sleep increase to 1 twice daily Ok temporary Ativan 1 mg 3 times daily as needed anxiety  01/05/22 appt noted: Off fluoxetine and  Trintellix.  Only on Ativan, trazodone and Adderall XR 20 plus added clonidine 0.1 mg BID Didn't think she needed to start Trintellix. Not taking Ativan.   Didn't like herself last week. Feels some better today. Wonders if the manic sx Not agitated.  Anxiety kind of calmed down.  A lot to be anxious about situationally.  $ stress. Concerns about downers with meds. Can't access normal personality. ? Lethargy and inability to talk as sE. Plan: Latuda 20-40 mg daily with food. Adderall to XR 20 mg AM Clonidine 0.1 1/2 tablet twice daily  reduce dose to be sure no SE Ok temporary Ativan 1 mg 3 times daily as needed anxiety  01/19/22 appt noted: Taking Latuda 20 mg daily.  Took 40 mg once and felt anxious and  SI Still depressed and not very reactive Anxiety mainly about the depression and fears of the future. She wants to revisit manic sx and thinks it was maybe bc taking delta 8  bc was taking a lot  of it so still doesn't think she's classic bipolar. She wants to only take Prozac bc thinks Latuda is perpetuating depression. Says the delta 8 was very psychaedelic.  When not taking it was not manic.  Sleeping ok again.  Plan: Per her request DC Latuda 20-40 mg daily with food. She wants to continue Prozac alone AMA  Adderall to XR 20 mg AM Clonidine 0.1 1/2 tablet twice daily  reduce dose to be sure no SE Ok temporary Ativan 1 mg 3 times daily as needed anxiety  01/23/2022 phone call complaining of increased anxiety since stopping Latuda.  She will try increasing clonidine.  01/26/2022 phone call not feeling well and wanted to restart the Vraylar.  However notes indicate that had made her agitated therefore she was encouraged to pick up samples of Rexulti 1 mg and start that instead.  02/06/2022 phone call: Stating she felt the Rexulti was helping with depression but she was not sleeping well and obsessing over things.  She was encouraged to increase Rexulti to 2 mg daily and increase trazodone for sleep.  02/09/2022 appointment with the following noted: This was an urgent work in appointment No sleep last night with trazodone 100 mg HS Nothing really better depression or anxiety. Ruminating negative anxious thoughts. Did not tolerate Rexulti because it was causing insomnia.  Does not think it helped depression.  Lacks emotion that she should have.  Lacks her usual personality.  Some hopeless thoughts.  Some death thoughts.  Some suicidal thoughts without plan or intent Plan: DC Rexulti and Prozac & DC trazodone Adderall to XR 20 mg AM Clonidine 0.1 1/tablet twice daily  reduce dose to be sure no SE Ok temporary Ativan 1 mg 3 times daily as needed anxiety Start Seroquel XR 150 mg nightly  03/02/2022 appointment: Angelique Blonder called back a few days after starting Seroquel stating it was making her more anxious and more depressed.  This seemed unlikely as this medicine rarely  ever causes anxiety.  She stopped the medication waited 3 days and called back still had anxiety and depression but thought perhaps the anxiety was a little better.  She did not want to take the Seroquel. She knew about the option of Spravato and wanted to pursue that. Now questions whether to return to Seroquel while waiting to start Spravato bc feels just as bad without it and knows she didn't give it enough time to work.   MADRS 46  ECT-MADRS    Flowsheet Row Clinical Support from 01/08/2023 in [facility] Clinical Support from 08/06/2022 in [facility] Clinical Support from 07/04/2022 in [facility] Clinical Support from 05/21/2022 in [facility] Office Visit from 03/02/2022 in [facility]  MADRS Total Score 21 29 15 27  46      03/14/22 appt noted: Pt received Spravato 56 mg first dose today with some dissociative sx which were not severe.  She was anxious prior to the administration and felt better after receiving lorazepam 1 mg.  No NV, or HA. Wants to continue Spravato. Ongoing depression and desperate to feel better.  I'm not myself DT deprsssion which is most severe in recent history.  Anhedonia.  Low motivation.  Social avoidance. Continues to think all recent med trials are making her worse.  Sleep ok with Seroquel.  03/16/22 appt noted: Received Spravato 84 mg for the first time.  some dissociative sx which were not severe.  She  was anxious prior to the administration and felt better after receiving lorazepam 1 mg.  No NV, or HA. Wants to continue Spravato.   Does not feel any better or different since the last appt.  Ongoing depression.  Ongoing depression and desperate to feel better.  I'm not myself DT deprsssion which is most severe in recent history.  Anhedonia.  Low motivation.  Social avoidance. Continues to think all recent med trials are  making her worse.  Sleep ok with Seroquel.  Does not want to continue Seroquel for TRD.  03/20/2022 appointment noted: Came for Spravato administration today.  However blood pressure was significantly elevated approximately 180/115.  She was given lorazepam 1 mg and clonidine 0.2 mg to try to get it down. She states she regretted stopping the Seroquel XR 300 mg tablets.  She now realizes it was helpful.  She did not sleep much at all last night.  She did not take the Adderall this morning. 2 to 3 hours after arrival blood pressure was still elevated at  170/110, 62 pulse.  For Spravato administration was canceled for today.  She admits to being anxious and depressed.  She is not suicidal.  She is highly motivated to receive the Spravato.  We discussed getting it tomorrow.  03/22/2022 appointment noted: Patient's blood pressure was never stable enough yesterday in order to get her in for Spravato administration.  She was encouraged to see her primary care doctor.  It is better today.  03/26/2022 appointment with the following noted: Blood pressure was better.  Saw her primary care doctor who started on oral Bystolic 2.5 mg daily. Received Spravato 84 mg today as scheduled.  Tolerated it well without nausea or vomiting headache or chest pain or palpitations.  Her blood pressure was borderline but manageable. She remains depressed and anxious.  She is ambivalent about the medicine and desperate to get to feel better.  Continues to have anhedonia and low energy and low motivation and reduced ability to do things.  Less social.  Not suicidal.  03/28/22 appt noted: Received Spravato 84 mg today as scheduled.  Tolerated it well without nausea or vomiting headache or chest pain or palpitations.  Her blood pressure was borderline but manageable. Has not seen any improvement so far.  Tolerating Seroquel.  Inconsistent with Bystolic and BP has been borderline high. Still depressed and anxious and anhedonia.  Low  motivation, energy, productivity. Taking quetiapine and tolerating XR 300 mg nightly.  04/04/22 appt noted: Received Spravato 84 mg today as scheduled.  Tolerated it well without nausea or vomiting headache or chest pain or palpitations.  Her blood pressure was borderline but manageable. Has not seen any improvement so far.  Tolerating Seroquel.   She still tends to think that the medications are making her worse.  She has said this about each of the recent psychiatric medicines including Seroquel.  However her husband thinks she is improved.  She also admits there is some improvement in productivity.  She still feels highly anxious.  She still does not enjoy things as normal.  She still feels desperate to improve as soon as possible. Has been taking Seroquel XR since 03/20/2022  04/10/22 appt noted: Received Spravato 84 mg today as scheduled.  Tolerated it well without nausea or vomiting headache or chest pain or palpitations.  Her blood pressure was borderline but manageable. Has not seen any improvement so far.  Tolerating Seroquel.  Doesn't like Seroquel bc she thinks it flattens here. 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  Tolerated it well without nausea or vomiting headache or chest pain or palpitations.  Her blood pressure was borderline but manageable. Has not seen any improvement so far.  Tolerating Seroquel.  Doesn't like Seroquel bc she thinks it flattens her. Received Spravato 84 mg today as scheduled.  Tolerated it well without nausea or vomiting headache or chest pain or palpitations.  Her blood pressure was borderline but manageable. Has not seen any improvement so far.  Tolerating Seroquel.  Doesn't like Seroquel bc she thinks it flattens here.  We discussed her ambivalence about it. She is starting Auvelity  and has tolerated it the last 2 days without side effect.  She still does not feel like herself and feels flat and not enjoying things with suppressed expressed emotion  04/17/2022 appointment with the following noted: Received Spravato 84 mg today as scheduled.  Tolerated it well without nausea or vomiting headache or chest pain or palpitations.  Her blood pressure was borderline but manageable. Has not seen any improvement so far.  Tolerating Seroquel.  Doesn't like Seroquel bc she thinks it flattens her. She has been tolerating the Auvelity 1 in the morning without side effects for about a week.  She has not noticed significant improvement so far.  She still feels depressed and flat and not herself.  Other people notice that she is flat emotionally.  She is not suicidal.  She does feel discouraged that she is not getting better yet.  04/19/2022 appointment noted: Has increased Auvelity to 1 twice daily for 2 days, continues quetiapine XR 300 mg nightly, clonidine 0.3 mg twice daily, lorazepam 1 mg twice daily for anxiety and Adderall XR 20 mg in the morning. No obious SE but she still thinks quetiapine XR is making her feel down.  But not sedated Received Spravato 84 mg today as scheduled.  Tolerated it well without nausea or vomiting headache or chest pain or palpitations.  Her blood pressure was borderline but manageable. She still feels quite anxious and feels it necessary to take both the clonidine and lorazepam twice a day to manage her anxiety.  She has been consistently down and flat and not herself until yesterday afternoon she noted an improvement in mood and feeling much more like herself with her normal personality reemerging.  She was quite depressed in the morning with very dark negative thoughts.  She did not have those dark negative thoughts this morning.  She had a lot of questions about medication and when she was expecting to be improved and why she has not shown improvement up to  now.  04/23/22 appt noted: Has increased Auvelity to 1 twice daily for 1 week, continues quetiapine XR 300 mg nightly, clonidine 0.3 mg twice daily, lorazepam 1 mg twice daily for anxiety and Adderall XR 20 mg in the morning. No obious SE but she still thinks quetiapine XR is making her feel down.  But not sedated Received Spravato 84 mg today as scheduled.  Tolerated it well without nausea or vomiting headache or chest pain or palpitations.  She is still depressed but admits better function and is able to enjoy social interactions. Tolerating meds.  Would like to feel better for sure. Not herself.  Flat. Plan increase Auvelity to 1 tab BID as planned and reduce Quetiapine to 1/2 of ER 300 mg  bc  NR for depression.  04/25/2022 appointment with the following noted: clonidine 0.3 mg twice daily, lorazepam 1 mg twice daily for anxiety and Adderall XR 20 mg in the morning. Seroquel XR 300 HS No obious SE but she still thinks quetiapine XR is making her feel down.  But not sedated Received Spravato 84 mg today as scheduled.  Tolerated it well without nausea or vomiting headache or chest pain or palpitations.  Called yesterday with more anxiety.  Had increased Auvelity for 1 day and reduced Seroquel XR for 1 day.  Felt restless and fearful  05/01/2022 appointment noted: clonidine 0.3 mg twice daily, lorazepam 1 mg twice daily for anxiety and Adderall XR 20 mg in the morning. Seroquel XR 150 HS, Auvelity 1 BID Received Spravato 84 mg today as scheduled.  Tolerated it well without nausea or vomiting headache or chest pain or palpitations.  Nurse has noted patient has called multiple times sometimes asking the same question repeatedly.  It is unclear whether she is truly forgetful or is just anxious seeking reassurance. Patient acknowledges ongoing depression as well as some anxiety but states she has felt a little better in the last couple of days.  She has reduced the Seroquel to 150 mg at night and has  increased Auvelity to 1 twice daily but only for 1 day.  So far she seems to be tolerating it.  05/03/22 appt noted: clonidine 0.2 mg twice daily, lorazepam 1 mg twice daily for anxiety and Adderall XR 20 mg in the morning. Seroquel XR 150 HS, Auvelity 1 BID BP high this am about 170/100 and received extra clonidine 0.2 mg and came to receive Spravato.  Not dizzy, no SOB, nor CP but BP is still high Could not receive Spravato today bc BP high and pulse low at 30 ppm. Still depressed and anxious. Plan: continue trial Auvelity with Spravato She needs to get BP and pulse managed  05/08/22 TC: RTC  H Michael NA and mailbox full.  Could not leave message.  Pt  -  talked to she and H on speaker. H worried over wife.  Vacant stare.  Slurs words at times.  Not smiling. Reduced enjoyment.  Depression.  Withdrawn from usual activities.  Some irritability.  Anxious. Disc her concerns meds are making her worse.  Extensive discussion about her treatment resistant status.  There is a consistent pattern of not taking the medicines long enough to get benefit because she believes the meds are making her worse.  However the symptoms she describes as side effects are exactly the same symptoms that she had prior to taking the medication RX for  the depression.  So it is not clear that these are actual side effects. This is true about the 2 most recent meds including Seroquel and Auvelity.  Recommend psychiatric consultation in hopes of improving her comfort level with taking prescribed medications for a sufficient length of time to provide benefit. Extensive discussion about ECT is the treatment of choice for treatment resistant depression.  Spravato may work if she can comply with consistency.  There are medication options but they take longer to work.   Plan:  Reduce clonidine to 0.1 mg BID DT bradycardia.  Talk with PCP about BP and low pulse problems which are interfering with her consistent compliance with  Spravato.   Limit lorazepam to 3 -4  mg daily max. Excess use is the cause of slurring speech.  She must stop excess use or will have to stop the  med. Stop Auvelity per her request.  But she has only been on the full dose for a little over a week and clearly has not had time to get benefit from it.  She thinks maybe it is making her more anxious. Reduce Seroquel from 150XR to 50 -100 mg at night IR.  She couldn't sleep when stopped it completely. Will not start new antidepressant until her SE issues are resolved or not. Get second psych opinion from Wellington Hampshire MD or another psychiatrist.  H's sister is therapist in [place], MD, [facility]  05/16/2022 appointment with the following noted: Received Spravato 84 mg today as scheduled.  Tolerated it well without nausea or vomiting headache or chest pain or palpitations.  She stopped Auvelity as discussed last week. On her own, without physician input, she restarted Wellbutrin XL 450 mg every morning today.  She had taken it in the past.  She feels jittery and anxious. She feels less depressed than she did last week.  But she is still depressed without her usual range of affect.  She still is less social and less motivated than normal. Her primary care doctor increased the dose of losartan Plan: Stop Seroquel Reduce Wellbutrin XL to 300 mg every morning.  Starting the dose at 450 every morning is likely causing side effects of jitteriness and it should not be started at that have a dosage. Recommend she not change meds on her own without MDM put  05/23/2022 appointment with the following noted: Received Spravato 84 mg today as scheduled.  Tolerated it well without nausea or vomiting headache or chest pain or palpitations.  Has not dropped seroquel XR 300 mg 1/2 tablet nightly bc couldn't sleep without it. Has not tried lower dose quetiapine 50 mg HS Still feels depressed.   BP is better managed so far, just saw PCP.  BP is better today and  infact is low today. Dropped clonidine as directed from 0.3 mg BID bc inadequate control of BP to 0.2 mg BID.  However she wants to increase it back to 0.3 mg twice daily because she feels it helped her anxiety better.  Wonders about increasing Wellbutrin for depression.  However she has only been on 300 mg a day for a week.  She was on 450 mg daily in the past.  06/06/22 appt noted: Received Spravato 84 mg today as scheduled.  Tolerated it well without nausea or vomiting headache or chest pain or palpitations.  She is still depressed and anxious.  She wants to try to stop the Seroquel but cannot sleep without some of it.  She is taking lorazepam 1 mg 4 times daily and still having a lot of anxiety.  She wants to increase clonidine back to 0.3 mg twice daily.  She hopes for more improvement She recently went for a second psychiatric opinion as suggested the results of that are pending.  06/11/22 appt noted: Received Spravato 84 mg today as scheduled.  Tolerated it well without nausea or vomiting headache or chest pain or palpitations.  She is still depressed and anxious. Without much change.  Still hopeless, anhedonia, reduced inteterest and motivation.  Tolerating meds. Disc concerns Spravato is not hleping much. Plan: stop Seroquel and start olanzapine 10 mg HS for TRD and anxiety.  06/13/2022 appointment noted: Received Spravato 84 mg today as scheduled.  Tolerated it well without nausea or vomiting headache or chest pain or palpitations.  She is still depressed and anxious. Without much change.  Still  hopeless, anhedonia, reduced inteterest and motivation.  Tolerating meds. Disc concerns Spravato is not helping much as hoped but is improving a bit in the last week. Tolerating meds. Continues Wellbutrin XL 450 AM, tolerating recently started olanzapine  10 mg HS. Sleep is good.   Pending appt with TMS consult.  06/18/22 appt noted: Received Spravato 84 mg today as scheduled.  Tolerated it well  without nausea or vomiting headache or chest pain or palpitations.  Tolerating meds. Continues Wellbutrin XL 450 AM, tolerating recently started olanzapine  10 mg HS. Continues Adderall XR 20 amd and has tried to reduce lorazepam to 1mg  TID Sleep is good.   Pending appt with TMS consult. Depression is a little bit better in the last week with a little improvement in emotional expression and interest.  She is pushing herself to be more active.  Her daughter thought she was a little better than she has been.  However she is still depressed and still not her normal self with anhedonia and reduced emotional expressiveness.  06/20/22 appt noted: Received Spravato 84 mg today as scheduled.  Tolerated it well without nausea or vomiting headache or chest pain or palpitations.  Tolerating meds with a little sleepiness. Continues Wellbutrin XL 450 AM, tolerating recently started olanzapine  10 mg HS. Continues Adderall XR 20 amd and has tried to reduce lorazepam to 1mg  TID Sleep is good.   Mood is improving.  Better funciton.  Anxiety is better with olanzapine. Still not herself and depression not gone with some anhedonia and social avoidance and feeling overwhelmed.  8/14 2023 received Spravato 84 mg 06/27/2022 received Spravato Spravato 84 mg 07/02/2022 received Spravato 84 mg 07/04/2022 received Spravato 84 mg  07/09/2022 appointment noted: Received Spravato 84 mg today as scheduled.  Tolerated it well without nausea or vomiting headache or chest pain or palpitations.  Expected dissociation and feels less depressed with resolution of negative emotions immediately after Spravato and then depression, anxiety creep back in. Continues meds Adderall XR 20 mg every morning, Wellbutrin XL 450 every morning, clonidine 0.1 mg twice daily, lorazepam 1 mg every 6 hours as needed, olanzapine increased from 7.5 to 10 mg nightly on  Tolerating meds.  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  She does note some increased appetite and is somewhat concerned about that but has not gained significant amounts of weight. She has had the TMS consultation which was initially denied but she knows it can be appealed.  However because she is improving with Spravato plus the other medications now she wants to continue the current treatment plan.  07/18/22 appt noted: Continues meds Adderall XR 20 mg every morning, Wellbutrin XL 450 every morning, clonidine 0.1 mg twice daily, lorazepam 1 mg every 6 hours as needed, olanzapine increased from 7.5 to 10 mg nightly on 07/04/2022. Received Spravato 84 mg today as scheduled.  Tolerated it well without nausea or vomiting headache or chest pain or palpitations.  Expected dissociation and feels less depressed with resolution of negative emotions immediately after Spravato and then depression, anxiety creep back in. Continues meds Adderall XR 20 mg every morning, Wellbutrin XL 450 every morning, clonidine 0.1 mg twice daily, lorazepam 1 mg every 6 hours as needed, olanzapine increased from 7.5 to 10 mg nightly on  Tolerating meds.  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  She does note some increased appetite and is somewhat concerned about that but has not gained significant amounts of weight. She has had the TMS consultation which was initially denied but she knows it can be appealed. She continues to have chronic ambivalence about psychiatric medicines and initially tends to blame her depressive symptoms such as decreased concentration and feeling flat on what ever medicine she currently is taking even though she had the same symptoms before the current medicines were started.  Then after discussion she does admit that her depressive  symptoms are improved since adding olanzapine but still has those residual symptoms noted.  07/23/22 received Spravato 84 mg   07/30/2022 appointment noted: Received Spravato 84 mg today as scheduled.  Tolerated it well without nausea or vomiting headache or chest pain or palpitations.  Expected dissociation and feels less depressed with resolution of negative emotions immediately after Spravato and then depression, anxiety creep back in. Continues meds Adderall XR 20 mg every morning, Wellbutrin XL 450 every morning, clonidine 0.1 mg twice daily, lorazepam 1 mg every 6 hours as needed, olanzapine increased from 7.5 to 10 mg nightly on  She has been inconsistent with olanzapine because she continues to be ambivalent about the medications in general and thinks that perhaps the 10 mg is making her feel blunted.  She continues to feel some depression.  She had a good day this week and but still feels somewhat depressed and persistently anxious. Plan: be consistent with olanzapine 10 mg HS for TRD and longer trial for potential benefit for anxiety.  Has not taken it consistently.  08/06/2022 appointment noted: Received Spravato 84 mg today as scheduled.  Tolerated it well without nausea or vomiting headache or chest pain or palpitations.  Expected dissociation and feels less depressed with resolution of negative emotions immediately after Spravato and then depression, anxiety creep back in. Continues meds Adderall XR 20 mg every morning, Wellbutrin XL 450 every morning, clonidine 0.1 mg twice daily, lorazepam 1 mg every 6 hours as needed, olanzapine i 10 mg nightly  She continues to feel depressed but is about 50% better with Spravato.  She is still not herself.  She still has anhedonia.  She still is not her able to engage socially in the typical ways.  She is not jovial and outgoing like normal.  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  She continues to feel that her personality is dampened down.  There is a question about whether it is related to depression or medication. Plan: continue olanzapine 10 for longer trial for TRD and severe anxiety.  08/13/22 appt noted:  Received Spravato 84 mg today as scheduled.  Tolerated it well without nausea or vomiting headache or chest pain or palpitations.  Expected dissociation and feels less depressed with resolution of negative emotions immediately after Spravato and then depression, anxiety creep back in. Continues meds Adderall XR 20 mg every morning, Wellbutrin XL 450 every morning, clonidine 0.1 mg twice daily, lorazepam 1 mg every 6 hours as needed, olanzapine i 10 mg nightly  She still does not feel herself.  Still struggles with depression and low motivation and reduced social engagement and reduced interest and reduced emotional expression.  She is somewhat better with the medicines plus Spravato.  She still believes the [employer]  makes her blunted and is not sure how much it helps her anxiety.  She can have good days when her family is around and she is engaged.  She still wants to stop the olanzapine. She has apparently continued to take the trazodone despite having been told to stop it when she started olanzapine.  She feels like she needs the trazodone. Plan: DC olanzapine and Start nortriptyline 25 mg nightly and build up to 75 mg nightly and then check blood level.    08/27/2022 appointment noted: Received Spravato 84 mg today as scheduled.  Tolerated it well without nausea or vomiting headache or chest pain or palpitations.  Expected dissociation and feels less depressed with resolution of negative emotions immediately after Spravato and then depression, anxiety creep back in. Continues meds Adderall XR 20 mg every morning, Wellbutrin XL 450 every morning, clonidine 0.1 mg twice daily, lorazepam 1 mg every 6 hours as needed. Stopped  olanzapine and started nortriptyline which she has taken for about a week is 75 mg nightly. So far she is tolerating the nortriptyline well with the exception of some dry mouth and constipation which she is working to manage.  She does not feel substantially better better or different off the olanzapine.  No change in her sleep which is good.  Main concern currently in addition to the residual depression is anxiety which is somewhat situational with pending arch show.  She is worrying about it more than normal.  Says she is having to take lorazepam twice a day where she had been able to keep reduce it prior to this.  She still does not feel like herself with residual depression with less social interest and less of her usual buoyancy in personality.  She is flatter than normal.  Overall she still feels that the Spravato has been helpful at reducing the severity of the depression.  She is not suicidal. She has not heard anything about the TMS appeal as of yet.  09/05/2022 appointment noted: Received Spravato 84 mg today as scheduled.  Tolerated it well without nausea or vomiting headache or chest pain or palpitations.  Expected dissociation and feels less depressed with resolution of negative emotions immediately after Spravato and then depression, anxiety creep back in. Continues meds Adderall XR 20 mg every morning, Wellbutrin XL 450 every morning, clonidine 0.1 mg twice daily, lorazepam 1 mg every 6 hours as needed. Stopped olanzapine and started nortriptyline which she has taken for about 2 week is 75 mg nightly. Initially blood pressure was a little high causing delay in starting Spravato.  She admitted to feeling a little wound up.  She still experiences a little increase in depression if she goes longer than a week in between doses of Spravato.  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  09/10/22 appt noted: Received Spravato 84 mg today as scheduled.  Tolerated it well without nausea or vomiting headache or chest pain or palpitations.  Expected dissociation gradually resolved over the 2 hour observation period. She feels 50% less depressed with Spravto and wants to continue it.   Continues meds Adderall XR 20 mg every morning, Wellbutrin XL 450 every morning, clonidine 0.1 mg twice daily, lorazepam 1 mg every 6 hours as needed. Has started nortriptyline 75 mg nightly for about 3 weeks. Has not seen a significant difference with the addition of nortriptyline.  Tolerating it  pretty well. She continues to have some degree of anhedonia and significant depression and anxiety.  Her daughters noticed that she is more needy and calls more frequently.  She acknowledges this as well.  She is clearly still not herself. Plan: pramipexole off label and RX 0.25 mg BID  09/17/2022 appointment noted: Received Spravato 84 mg today as scheduled.  Tolerated it well without nausea or vomiting headache or chest pain or palpitations.  Expected dissociation gradually resolved over the 2 hour observation period. She feels 50% less depressed with Spravto and wants to continue it.   Continues meds Adderall XR 20 mg every morning, Wellbutrin XL 450 every morning, clonidine 0.1 mg twice daily, lorazepam 1 mg every 6 hours as needed. Has started nortriptyline 75 mg nightly for about 3 weeks and DT level 176, reduced to 50 mg HS early November. Still the same sx as noted last visit.  Tolerating meds.   Compliant.  Still depressed and family notices.  Has been able to participate in family interactions.  Some post-show let down and has to do detailed work which is hard for her bc ADD.  Sleep and eating well.  Energy OK but not great.  No SI.  Not cried in a year or so.  Clearly less depressed and hopeless than before the Spravato.  09/24/22 appt noted: Received Spravato 84 mg today as scheduled.   Tolerated it well without nausea or vomiting headache or chest pain or palpitations.  Expected dissociation gradually resolved over the 2 hour observation period. She feels 50% less depressed with Spravto and wants to continue it.   Continues meds Adderall XR 20 mg every morning, Wellbutrin XL 450 every morning, clonidine 0.1 mg twice daily, lorazepam 1 mg every 6 hours as needed. Has started nortriptyline 75 mg nightly for about 3 weeks and DT level 176, reduced to 50 mg HS early November. Still the same sx as noted last visit.  Tolerating meds.   Compliant.  Still depressed and family notices.  Has been able to participate in family interactions.  Some post-show let down and has to do detailed work which is hard for her bc ADD.  Sleep and eating well.  Energy OK but not great.  No SI.  Not cried in a year or so.  Clearly less depressed and hopeless than before the Spravato. Is not making further progress generally.  Stuck with moderate depression  10/02/22 appt noted: Received Spravato 84 mg today as scheduled.  Tolerated it well without nausea or vomiting headache or chest pain or palpitations.  Expected dissociation gradually resolved over the 2 hour observation period. She feels 50% less depressed with Spravto and wants to continue it.   Continues meds Adderall XR 20 mg every morning, Wellbutrin XL 450 every morning, clonidine 0.1 mg twice daily, lorazepam 1 mg every 6 hours as needed. Has started nortriptyline 75 mg nightly for about 3 weeks and DT level 176, reduced to 50 mg HS early November. Still the same sx as noted last visit.  Tolerating meds.   Compliant.  Still depressed and family notices.  Has been able to participate in family interactions.  Some post-show let down and has to do detailed work which is hard for her bc ADD.  Sleep and eating well.  Energy OK but not great.  No SI.  Not cried in a year or so.  Clearly less depressed and hopeless than before the Spravato. Is not making  further progress generally.  Stuck with  moderate depression.  Is able to function pretty normally. Plan: trial pramipexole 0.25 mg BID off label for depression.   10/08/22 appt noted: Received Spravato 84 mg today as scheduled.  Tolerated it well without nausea or vomiting headache or chest pain or palpitations.  Expected dissociation gradually resolved over the 2 hour observation period. She feels 50% less depressed with Spravto and wants to continue it.   Continues meds Adderall XR 20 mg every morning, Wellbutrin XL 450 every morning, clonidine 0.1 mg twice daily, lorazepam 1 mg every 6 hours as needed. Has started nortriptyline 75 mg nightly for about 3 weeks and DT level 176, reduced to 50 mg HS early November. Still the same sx as noted last visit.  Tolerating meds.   Compliant.  Still depressed and family notices.  Has been able to participate in family interactions.  Some post-show let down and has to do detailed work which is hard for her bc ADD.  Sleep and eating well.  Energy OK but not great.  No SI.  Not cried in a year or so.  Clearly less depressed and hopeless than before the Spravato. Is not making further progress generally.  Stuck with moderate depression.  Behaved and felt pretty normally with family over for Thanksgiving. Doesn't see benefit or SE with pramipexole but thinks maybe it makes her worse. Plan:  increase pramipexole augmentation off label to 0.5 mg BID  10/15/2022 appointment noted: Received Spravato 84 mg today as scheduled.  Tolerated it well without nausea or vomiting headache or chest pain or palpitations.  Expected dissociation gradually resolved over the 2 hour observation period. She feels 50% less depressed with Spravto and wants to continue it.   Continues meds Adderall XR 20 mg every morning, Wellbutrin XL 450 every morning, clonidine 0.2 mg twice daily, lorazepam 1 mg every 6 hours as needed.  Trazodone 50 mg tablets 1-2 nightly as needed insomnia Has  started nortriptyline 75 mg nightly for about 3 weeks and DT level 176, reduced to 50 mg HS early November. Recommended increase pramipexole 0.5 mg twice daily off label for treatment resistant depression on 10/08/2022. She feels better motivated more active with pramipexole 0.5 mg twice daily.  She still is depressed but it is better.  We discussed the possibility of going up in the dose but did not change it.  10/22/2022 appointment noted: Received Spravato 84 mg today as scheduled.  Tolerated it well without nausea or vomiting headache or chest pain or palpitations.  Expected dissociation gradually resolved over the 2 hour observation period. She feels 50% less depressed with Spravto and wants to continue it.   Continues meds Adderall XR 20 mg every morning, Wellbutrin XL 450 every morning, clonidine 0.2 mg twice daily, lorazepam 1 mg every 8 hours as needed.  Trazodone 50 mg tablets 1-2 nightly as needed insomnia Has started nortriptyline 75 mg nightly for about 3 weeks and DT level 176, reduced to 50 mg HS early November. pramipexole 0.5 mg twice daily off label for treatment resistant depression on 10/08/2022. She is better motivated than she was.  She is journaling 3 pages a day.  She has started walking and has walked 5 days a week for 50 minutes for the last 2 weeks.  That is significantly helped her mood.  Her mood tends to be better when she interacts with family.  However she still has some periods of depression.  She is tolerating the medications well.  She still feels like her affect  and confidence is not back to normal. Plan:  increase pramipexole augmentation off label to 0.5 mg tID or 0.75 mg BID   10/29/22 appt noted: Received Spravato 84 mg today as scheduled.  Tolerated it well without nausea or vomiting headache or chest pain or palpitations.  Expected dissociation gradually resolved over the 2 hour observation period. She feels 50% less depressed with Spravto and wants to  continue it.   Continues meds Adderall XR 20 mg every morning, Wellbutrin XL 450 every morning, clonidine 0.2 mg twice daily, lorazepam 1 mg every 8 hours as needed.  Trazodone 50 mg tablets 1-2 nightly as needed insomnia Has started nortriptyline 75 mg nightly for about 3 weeks and DT level 176, reduced to 50 mg HS early November. pramipexole increased to 0.75 mg twice daily off label for treatment resistant depression  She has been feeling somewhat better with the increase in pramipexole and is tolerating it well.  She still is easily overwhelmed.  Her affect and mood can improve now when around her family or doing something positive.  She has been able to be more productive. She is tolerating the current medications. She is still considering TMS as an alternative to Spravato.  11/15/2022 appointment noted: Received Spravato 84 mg today as scheduled.  Tolerated it well without nausea or vomiting headache or chest pain or palpitations.  Expected dissociation gradually resolved over the 2 hour observation period. She feels 50% less depressed with Spravto and wants to continue it.   Continues meds Adderall XR 20 mg every morning, Wellbutrin XL 450 every morning, clonidine 0.2 mg twice daily, lorazepam 1 mg every 8 hours as needed.  Trazodone 50 mg tablets 1-2 nightly as needed insomnia Has started nortriptyline 75 mg nightly for about 3 weeks and DT level 176, reduced to 50 mg HS early November. pramipexole increased to 0.75 mg twice daily off label for treatment resistant depression  He has noticed some increase in depression due to the length of time since the last Spravato administration.  She believes Spravato is helping her.  sHe is not suicidal but has felt very blue the last few days. She is tolerating the medication. She is ambivalent about Spravato versus TMS but she is considering TMS. She wants to continue Spravato administration because it is clearly helpful.  11/19/22 appt noted: Received  Spravato 84 mg today as scheduled.  Tolerated it well without nausea or vomiting headache or chest pain or palpitations.  Expected dissociation gradually resolved over the 2 hour observation period. She feels 50% less depressed with Spravto and wants to continue it.   Continues meds Adderall XR 20 mg every morning, Wellbutrin XL 450 every morning, clonidine 0.2 mg twice daily, lorazepam 1 mg every 8 hours as needed.  Trazodone 50 mg tablets 1-2 nightly as needed insomnia Has started nortriptyline 75 mg nightly for about 3 weeks and DT level 176, reduced to 50 mg HS early November. pramipexole increased to 0.75 mg twice daily off label for treatment resistant depression  She has felt better back on Spravato more regularly.  However she still has residual depression esp when alone or when wihtout activity.  Can function when needed.   Does not have her connfidence back. She plans to pursue TMS availability. Have discussed retrying Auvelity  11/28/22 appt noted: Received Spravato 84 mg today as scheduled.  Tolerated it well without nausea or vomiting headache or chest pain or palpitations.  Expected dissociation gradually resolved over the 2 hour observation period.  She feels 50% less depressed with Spravto and wants to continue it.   Continues meds Adderall XR 20 mg every morning, Wellbutrin XL 450 every morning, clonidine 0.2 mg twice daily, lorazepam 1 mg every 8 hours as needed.  Trazodone 50 mg tablets 1-2 nightly as needed insomnia Has started nortriptyline 75 mg nightly for about 3 weeks and DT level 176, reduced to 50 mg HS early November. pramipexole increased to 0.75 mg twice daily off label for treatment resistant depression  Last week she was more depressed than usual for reasons that are not clear.  She has not had a clear answer from Filomena Jungling about [employer] options.  States that they have not returned her call.  She is asked about Auvelity retry in place of Wellbutrin.  Has still been  walking.  12/03/22 appt noted: Received Spravato 84 mg today as scheduled.  Tolerated it well without nausea or vomiting headache or chest pain or palpitations.  Expected dissociation gradually resolved over the 2 hour observation period. She feels 50% less depressed with Spravto and wants to continue it.   Continues meds Adderall XR 20 mg every morning, Wellbutrin XL 450 every morning, clonidine 0.2 mg twice daily, lorazepam 1 mg every 8 hours as needed.  Trazodone 50 mg tablets 1-2 nightly as needed insomnia started nortriptyline 75 mg nightly for about 3 weeks and DT level 176, reduced to 50 mg HS early November. pramipexole increased to 0.75 mg twice daily off label for treatment resistant depression  No SE .  Satisfied with meds. Depression is better in the last week.  Not sure why that is the case.  Still walking daily and that helps and journaling 3 pages daily.  Working on Biomedical scientist.  No changes desire.  Clearly benefits from Spravato but not 100%.  Still considering TMS.  12/10/22 appt noted: Received Spravato 84 mg today as scheduled.  Tolerated it well without nausea or vomiting headache or chest pain or palpitations.  Expected dissociation gradually resolved over the 2 hour observation period. She feels 50% less depressed with Spravto and wants to continue it.   Continues meds Adderall XR 20 mg every morning, Wellbutrin XL 450 every morning, clonidine 0.2 mg twice daily, lorazepam 1 mg every 8 hours as needed.  Trazodone 50 mg tablets 1-2 nightly as needed insomnia started nortriptyline 75 mg nightly for about 3 weeks and DT level 176, reduced to 50 mg HS early November. pramipexole increased to 0.75 mg twice daily off label for treatment resistant depression  No SE .   Depression was worse this week for no apparent reason.  She struggled being positive.  She has felt more discouraged.  She has felt more anxious.  She has had a hard time doing tasks.  She is interested in retrying the  [employer] which we had discussed previously. Plan: She agrees to [employer].  To improve tolerability and reduce risk of side effects, Stop Wellbutrin and start Auvelity 1 in the morning for 1 week then 1 twice daily AndReduce pramipexole 0.5 mg BID and reduce nortriptyline to 50 mg HS  12/17/22 appt noted: Received Spravato 84 mg today as scheduled.  Tolerated it well without nausea or vomiting headache or chest pain or palpitations.  Expected dissociation gradually resolved over the 2 hour observation period. She feels 50% less depressed with Spravto and wants to continue it.   Continues meds Adderall XR 20 mg every morning, stopped Wellbutrin XL 150 every morning, clonidine 0.2 mg twice daily,  lorazepam 1 mg every 8 hours as needed.  Trazodone 50 mg tablets 1-2 nightly as needed insomnia Has started nortriptyline 75 mg nightly for about 3 weeks and DT level 176, reduced to 50 mg HS early November. pramipexole 0.375 mg twice daily off label for treatment resistant depression with plan to stop Started Auvelity 1 AM Still depressed to moderate degree.  Tolerating Auvelity so far.  No other problems with meds.  Willing to give Auvelity a chance. Plan: She agrees to [employer].  Continue 1 AM and when tolerated then increase to BID Stop pramipexole.  12/26/22 received Spravato  01/01/23 appt noted: Received Spravato 84 mg today as scheduled.  Tolerated it well without nausea or vomiting headache or chest pain or palpitations.  Expected dissociation gradually resolved over the 2 hour observation period. She feels 50% less depressed with Spravto and wants to continue it.   Continues meds Adderall XR 20 mg every morning, stopped Wellbutrin XL 150 every morning, clonidine 0.2 mg twice daily, lorazepam 1 mg every 8 hours as needed.  Trazodone 50 mg tablets 1-2 nightly as needed insomnia Has started nortriptyline 75 mg nightly for about 3 weeks and DT level 176, reduced to 50 mg HS early  November. Forgot to reduce pramipexole stoll taking  0.5 mg twice daily off label for treatment resistant depression  Started Auvelity 1 AM & PM last week. Occ misses Spravato DT htn.this week a better than last.   Painting again more and it helps mood.  Confidence still low and not as likely to socialize as normal but enjoys family.   Still ambivalent about TMS. Tolerating meds.  Including the increase in [employer].    01/08/23 appt noted: Received Spravato 84 mg today as scheduled.  Tolerated it well without nausea or vomiting headache or chest pain or palpitations.  Expected dissociation gradually resolved over the 2 hour observation period. She feels 50% less depressed with Spravto and wants to continue it.   Continues meds Adderall XR 20 mg every morning, stopped Wellbutrin XL 150 every morning, clonidine 0.2 mg twice daily, lorazepam 1 mg every 8 hours as needed.  Trazodone 50 mg tablets 1-2 nightly as needed insomnia Has started nortriptyline 75 mg nightly for about 3 weeks and DT level 176, reduced to 50 mg HS early November. reduced pramipexole to 0.5 mg daily off label for treatment resistant depression  Started Auvelity 1 AM & PM mid Feb. More depressed as week progresses.  Thinks it is worse with less pramipexole.  More negative.  Able to function but feels miserable.  No SI.  Hopeless.  Sleep ok.  Tolerating meds.   [facility] to [facility] about TMS and appt mid March. Plan: increase pramipexole to 0.5 mg TID for dep bc seemed to worsen with the reduction.  01/15/23 appt noted: Received Spravato 84 mg today as scheduled.  Tolerated it well without nausea or vomiting headache or chest pain or palpitations.  Expected dissociation gradually resolved over the 2 hour observation period. She feels 50% less depressed with Spravto and wants to continue it.   Continues meds Adderall XR 20 mg every morning, stopped Wellbutrin XL 150 every morning, clonidine 0.2 mg twice daily, lorazepam 1 mg every 8  hours as needed.  Trazodone 50 mg tablets 1-2 nightly as needed insomnia Has started nortriptyline 75 mg nightly for about 3 weeks and DT level 176, reduced to 50 mg HS early November. Increased pramipexole to 0.5 mg  to TID  off label for  treatment resistant depression  Started Auvelity 1 AM & PM mid Feb. markedly better depression the increase in pramipexole.  She feels like her depression is almost resolved.  She is very pleased with the response.  She is tolerating the medications well  01/30/23 appt noted: Received Spravato 84 mg today as scheduled.  Tolerated it well without nausea or vomiting headache or chest pain or palpitations.  Expected dissociation gradually resolved over the 2 hour observation period. She feels 50% less depressed with Spravto and wants to continue it.   Continues meds Adderall XR 20 mg every morning, stopped Wellbutrin XL 150 every morning, clonidine 0.2 mg twice daily, lorazepam 1 mg every 8 hours as needed.  Trazodone 50 mg tablets 1-2 nightly as needed insomnia Has started nortriptyline 75 mg nightly for about 3 weeks and DT level 176, reduced to 50 mg HS early November. Increased pramipexole to 0.5 mg  to TID  off label for treatment resistant depression  Started Auvelity 1 AM & PM mid Feb. Mood markedly better with pramipexole added.  Nearly normal mood now with minimal depression.  More social and outgoing and motivated and resolved anhedonia. No SE.  Compliant.  02/04/23 appt noted: Continues meds Adderall XR 20 mg every morning, stopped Wellbutrin XL 150 every morning, clonidine 0.2 mg twice daily, lorazepam 1 mg every 8 hours as needed.  Trazodone 50 mg tablets 1-2 nightly as needed insomnia Has started nortriptyline 75 mg nightly for about 3 weeks and DT level 176, reduced to 50 mg HS early November. Increased pramipexole to 0.5 mg  to TID  off label for treatment resistant depression  Started Auvelity 1 AM & PM mid Feb. Received Spravato 84 mg today as  scheduled.  Tolerated it well without nausea or vomiting headache or chest pain or palpitations.  Expected dissociation gradually resolved over the 2 hour observation period. She feels 50% less depressed with Spravto and wants to continue it.  The pramipexole got her the rest of the way better.  Doesn't want to cut back on any tx bc afraid of relapse. Tolerating meds.  02/15/23 appt noted: Continues meds Adderall XR 20 mg every morning, stopped Wellbutrin XL 150 every morning, clonidine 0.2 mg twice daily, lorazepam 1 mg every 8 hours as needed.  Trazodone 50 mg tablets 1-2 nightly as needed insomnia Has started nortriptyline 75 mg nightly for about 3 weeks and DT level 176, reduced to 50 mg HS early November. Increased pramipexole to 0.5 mg  to TID  off label for treatment resistant depression  Started Auvelity 1 AM & PM mid Feb. Received Spravato 84 mg today as scheduled.  Tolerated it well without nausea or vomiting headache or chest pain or palpitations.  Expected dissociation gradually resolved over the 2 hour observation period. She feels no longer depressed.   The pramipexole got her the rest of the way better.  Doesn't want to cut back on any tx bc afraid of relapse. Tolerating meds.  02/18/23 appt noted: Continues meds Adderall XR 20 mg every morning, stopped Wellbutrin XL 150 every morning, clonidine 0.2 mg twice daily, lorazepam 1 mg every 8 hours as needed.  Trazodone 50 mg tablets 1-2 nightly as needed insomnia Has started nortriptyline 75 mg nightly for about 3 weeks and DT level 176, reduced to 50 mg HS early November. Increased pramipexole to 0.5 mg  to TID  off label for treatment resistant depression  Started Auvelity 1 AM & PM mid Feb. Received Spravato 84 mg  today as scheduled.  Tolerated it well without nausea or vomiting headache or chest pain or palpitations.  Expected dissociation gradually resolved over the 2 hour observation period. She feels no longer depressed except when  ran out of Auvelity this week DT lack of availability.  Much worse without it..   The pramipexole got her the rest of the way better.  Doesn't want to cut back on any tx bc afraid of relapse. Tolerating meds.  02/25/23 appt: Received Spravato 84 mg today as scheduled.  Tolerated it well without nausea or vomiting headache or chest pain or palpitations.  Expected dissociation gradually resolved over the 2 hour observation period. Continues meds Adderall XR 20 mg every morning, stopped Wellbutrin XL 150 every morning, clonidine 0.2 mg twice daily, lorazepam 1 mg every 8 hours as needed.  Trazodone 50 mg tablets 1-2 nightly as needed insomnia Has started nortriptyline 75 mg nightly for about 3 weeks and DT level 176, reduced to 50 mg HS early November. Increased pramipexole to 0.5 mg  to TID  off label for treatment resistant depression  Started Auvelity 1 AM & PM mid Feb. No SE Still dramatically better with meds and Spravato.  Not 100% over depression. Had GS born last week and able to enjoy it now.  Is a little sleepy with pramipexole but manageable.  No change desired.  Sleep is ok.  03/04/23 appt noted: Received Spravato 84 mg today as scheduled.  Tolerated it well without nausea or vomiting headache or chest pain or palpitations.  Expected dissociation gradually resolved over the 2 hour observation period. Continues meds Adderall XR 20 mg every morning, stopped Wellbutrin XL 150 every morning, clonidine 0.2 mg twice daily, lorazepam 1 mg every 8 hours as needed.  Trazodone 50 mg tablets 1-2 nightly as needed insomnia nortriptyline reduced to 50 mg HS early November. Increased pramipexole to 0.5 mg  to TID  off label for treatment resistant depression  Started Auvelity 1 AM & PM mid Feb. Still generally doing well with meds and Spravato except when got tooth abscess.  Freels more depressed after dental surgery.   No SE with meds.   Sleep is OK.  No med changes desired  03/11/23 appt  noted: Received Spravato 84 mg today as scheduled.  Tolerated it well without nausea or vomiting headache or chest pain or palpitations.  Expected dissociation gradually resolved over the 2 hour observation period. Continues meds Adderall XR 20 mg every morning, stopped Wellbutrin XL 150 every morning, clonidine 0.2 mg twice daily, lorazepam 1 mg every 8 hours as needed.  Trazodone 50 mg tablets 1-2 nightly as needed insomnia nortriptyline reduced to 50 mg HS early November. Increased pramipexole to 0.5 mg  to TID  off label for treatment resistant depression  Started Auvelity 1 AM & PM mid Feb. Some increase depression with infection and needing amoxiciliin this week.  Otherwie still doing well with mood and meds. No med changes desired or indicated. No SE  03/21/2023 appointment noted: Continues meds Adderall XR 20 mg every morning, stopped Wellbutrin XL 150 every morning, clonidine 0.2 mg twice daily, lorazepam 1 mg every 8 hours as needed.  Trazodone 50 mg tablets 1-2 nightly as needed insomnia nortriptyline reduced to 50 mg HS early November. Increased pramipexole to 0.5 mg  to TID  off label for treatment resistant depression  Started Auvelity 1 AM & PM mid Feb. Received Spravato 84 mg today as scheduled.  Tolerated it well without nausea or vomiting headache  or chest pain or palpitations.  Expected dissociation gradually resolved over the 2 hour observation period. Depression is much better.  Pretty much resolved this week.  She feels the Auvelity and pramipexole have made the biggest difference.  Obviously the Spravato is also helping significantly.  She wants to continue all of these medications.  She still has anxiety easily.  Especially facing something that she would rather avoid.  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 Tolerating meds without significant side effects. Plan no med changes  03/29/23 appt  noted: Continues meds Adderall XR 20 mg every morning, stopped Wellbutrin XL 150 every morning, clonidine 0.2 mg twice daily, lorazepam 1 mg every 8 hours as needed.  Trazodone 50 mg tablets 1-2 nightly as needed insomnia, nortriptyline 50 mg HS,  pramipexole 0.5 mg  to TID  off label for treatment resistant depression  Started Auvelity 1 AM & PM mid Feb 2024 No SE Received Spravato 84 mg today as scheduled.  Tolerated it well without nausea or vomiting headache or chest pain or palpitations.  Expected dissociation gradually resolved over the 2 hour observation period. Depression continues to improve to a point of very mild sx.  Still hasn't gotten her confidence fully back.  She does enjoy social interactions and is productive at work. She is tolerating the meds well without side effects.  We discussed the possibility of reducing some of the medication given the marked response which was positive with pramipexole.  04/04/23 appt noted: Continues meds Adderall XR 20 mg every morning, stopped Wellbutrin XL 150 every morning, clonidine 0.2 mg twice daily, lorazepam 1 mg every 8 hours as needed.  Trazodone 50 mg tablets 1-2 nightly as needed insomnia, nortriptyline 50 mg HS,  pramipexole 0.5 mg  to TID  off label for treatment resistant depression  Started Auvelity 1 AM & PM mid Feb 2024 No SE Received Spravato 84 mg today as scheduled.  Tolerated it well without nausea or vomiting headache or chest pain or palpitations.  Expected dissociation gradually resolved over the 2 hour observation period. Dep is still much improved with increase in pramipexole.  Largely resolved.  Still gets anxious but takes less lorazepam prn but still needs it.  Sleep ok.  Tolerating meds.  04/10/23 appt noted: Continues meds Adderall XR 20 mg every morning, clonidine 0.2 mg twice daily, lorazepam 1 mg every 8 hours as needed.  Trazodone 50 mg tablets 1-2 nightly as needed insomnia, nortriptyline 50 mg HS,  pramipexole 0.5 mg   to TID  off label for treatment resistant depression  Started Auvelity 1 AM & PM mid Feb 2024 No SE.  Satisfied with meds.   Received Spravato 84 mg today as scheduled.  Tolerated it well without nausea or vomiting headache or chest pain or palpitations.  Expected dissociation gradually resolved over the 2 hour observation period. Dep is still much improved with increase in pramipexole.  Largely resolved.  Still gets anxious but takes less lorazepam prn but still needs it.  Sleep ok.  Tolerating meds. Did an art show in another town; I couldn't have done this a year ago.    05/07/23 appt noted: Continues meds Adderall XR 20 mg every morning, clonidine 0.2 mg twice daily, lorazepam 1 mg every 8 hours as needed.  Trazodone 50 mg tablets 1-2 nightly as needed insomnia, nortriptyline 25-50 mg HS,  pramipexole 0.5 mg  to TID  off label for treatment resistant depression  Started Auvelity 1 AM & PM mid Feb 2024 No SE.  Satisfied with meds.   Received Spravato 84 mg today as scheduled.  Tolerated it well without nausea or vomiting headache or chest pain or palpitations.  Expected dissociation gradually resolved over the 2 hour observation period. H says she is her old self.  Dep so much better.  Painting going well.  Anxiety in AM and then better.  Wakes with it.  Structured days more.  Negative self talk gone.  Wonderful to have relief.  Good weekend at a wedding.  Enjoys GS; [place].  More outspoken since the dep but not over the top.   More interest overall and satisfied wth resp.  Agrees to wean nortriptyline but doesn't want other med changes.  05/15/23 received Spravato 84  05/27/23 appt noted: Continues meds Adderall XR 20 mg every morning, clonidine 0.2 mg twice daily, lorazepam 1 mg every 8 hours as needed.  Trazodone 50 mg tablets 1-2 nightly as needed insomnia, nortriptyline 25-50 mg HS,  pramipexole 0.5 mg  to TID  off label for treatment resistant depression  Started Auvelity 1 AM & PM mid Feb  2024 No SE.  Satisfied with meds.   Received Spravato 84 mg today as scheduled.  Tolerated it well without nausea or vomiting headache or chest pain or palpitations.  Expected dissociation gradually resolved over the 2 hour observation period. Maybe a little more anxious re: weaning off nortriptyline.  Busy summer.  Extremely prolific painting.   Has a pending show and stressed over preparing.   Enjoys [employer].  Kept him yesterday for a couple of hours.   Dep has been like a cloak with physical sensation for a long time but is much better than it was.  Still not 100% all the time.  Struggles with some chronic guilt issues.  Self talk and CBT on herself yesterday had some. Still some fear about travelling and demands but so much better.   Making people laugh again and socially engaged and H agrees so much better. M has been impossible.  WC and immobile.  She wastes money.   Plan no med changes  06/03/23 appt noted: Meds as above.  No SE.  Satisfied with meds.   Received Spravato 84 mg today as scheduled.  Tolerated it well without nausea or vomiting headache or chest pain or palpitations.  Expected dissociation gradually resolved over the 2 hour observation period. Overall she feels better with Spravato.  She is minimally depressed generally when she is alone and not active but she is staying active socially and with her painting.  This is really helped with her depression and she is having more success.  She still has residual anxiety which she feels she needs to take lorazepam during the day.  She is attempting to minimize it but without much success.  She wants to continue the Spravato because of the dramatic improvement she is seen with this and the medications.  ECT-MADRS    Flowsheet Row Clinical Support from 01/08/2023 in [facility] Clinical Support from 08/06/2022 in [facility] Clinical Support from 07/04/2022 in [facility]  [facility] Clinical Support from 05/21/2022 in [facility] Office Visit from 03/02/2022 in [facility]  MADRS Total Score 21 29 15 27  46      Past Psychiatric Medication Trials: fluoxetine, duloxetine, Viibryd, Pristiq, sertraline, citalopram,  Trintellix  anxious and SI Wellbutrin XL 450 Auvelity 1 dose nortriptyline 75 mg nightly for about 3 weeks and DT level 176, reduced to 50 mg HS NR. Pramipexole 1 mg NR Adderall, Adderall XR, Vyvanse, Ritalin, Strattera low dose NR Lorazepam Trazodone  Depakote,  lamotrigine cog complaints Lithium remotely Abilify 7.5  Vraylar 1.5 mg daily agitation and insomnia Rexulti insomnia Latuda 40 one dose, CO anxious and SI Seroquel XR 300 Olanzapine 10  At visit November 12, 2019. We discussed Patient developed an increasingly severe alcohol dependence problem since her last visit in January.  She went to [facility] and has had no alcohol since then except 1 day.  She never abused stimulants but they took her off the stimulants at [facility].  Her ADD was markedly worse.  The Wellbutrin did not help the ADD.   D history lamotrigine rash at 67 yo  Review of Systems:  Review of Systems  Constitutional:  Negative for fatigue.  Musculoskeletal:  Positive for back pain. Negative for arthralgias and joint swelling.       SP hip surgery October 2020  Neurological:  Negative for dizziness and tremors.  Psychiatric/Behavioral:  Negative for agitation, behavioral problems, confusion, decreased concentration, dysphoric mood, hallucinations, self-injury, sleep disturbance and suicidal ideas. The patient is nervous/anxious. The patient is not hyperactive.        Forgetful at times about med recommendations.    Medications: I have reviewed the patient's current medications.  Current Outpatient Medications  Medication Sig Dispense Refill   amLODipine (NORVASC) 2.5 MG tablet  Take 2.5 mg by mouth daily.     amphetamine-dextroamphetamine (ADDERALL XR) 20 MG 24 hr capsule Take 1 capsule (20 mg total) by mouth every morning. 30 capsule 0   amphetamine-dextroamphetamine (ADDERALL XR) 20 MG 24 hr capsule Take 1 capsule (20 mg total) by mouth every morning. 30 capsule 0   cloNIDine (CATAPRES) 0.2 MG tablet TAKE 1 TABLET BY MOUTH TWICE A DAY 180 tablet 0   Dextromethorphan-buPROPion ER (AUVELITY) 45-105 MG TBCR Take 1 tablet by mouth 2 (two) times daily. 60 tablet 3   Esketamine HCl, 84 MG Dose, (SPRAVATO, 84 MG DOSE,) 28 MG/DEVICE SOPK USE 3 SPRAYS IN EACH NOSTRIL ONCE A WEEK 3 each 2   iron polysaccharides (NIFEREX) 150 MG capsule TAKE 1 CAPSULE BY MOUTH EVERY DAY 90 capsule 1   LORazepam (ATIVAN) 1 MG tablet Take 1 tablet (1 mg total) by mouth every 8 (eight) hours as needed. for anxiety 90 tablet 1   losartan (COZAAR) 50 MG tablet Take 50 mg by mouth daily.     nebivolol (BYSTOLIC) 2.5 MG tablet Take 2.5 mg by mouth daily.     pramipexole (MIRAPEX) 0.5 MG tablet Take 1 tablet (0.5 mg total) by mouth 3 (three) times daily. 90 tablet 1   traZODone (DESYREL) 50 MG tablet TAKE 1-2 TABLETS BY MOUTH NIGHTLY AS NEEDED FOR SLEEP 180 tablet 1   No current facility-administered medications for this visit.    Medication Side Effects: None  Allergies:  Allergies  Allergen Reactions   Metronidazole Shortness Of Breath and Other (See Comments)    Heart pounding   Ferrlecit [Na Ferric Gluc Cplx In Sucrose] Other (See Comments)    Infusion reaction 05/12/2019    Past Medical History:  Diagnosis Date   ADHD    Anemia    Anxiety    Arthritis    Depression    Heart murmur    i went to see a cardiologit slast  eyar  and i had zero plaque,    PONV (postoperative nausea and vomiting)    Recovering alcoholic in remission (HCC)     Family History  Problem Relation Age of Onset   Atrial fibrillation Mother    CAD Father     Past Medical History, Surgical history, Social  history, and Family history were reviewed and updated as appropriate.   Please see review of systems for further details on the patient's review from today.   Objective:   Physical Exam:  There were no vitals taken for this visit.  Physical Exam Constitutional:      General: She is not in acute distress. Neurological:     Mental Status: She is alert and oriented to person, place, and time.     Coordination: Coordination normal.     Gait: Gait normal.  Psychiatric:        Attention and Perception: Attention and perception normal.        Mood and Affect: Mood is anxious. Mood is not depressed.        Speech: Speech is not rapid and pressured.        Behavior: Behavior is not slowed or hyperactive.        Thought Content: Thought content is not paranoid or delusional. Thought content does not include homicidal or suicidal ideation. Thought content does not include suicidal plan.        Cognition and Memory: Cognition normal. Memory is not impaired.        Judgment: Judgment normal.     Comments: Insight intact. No auditory or visual hallucinations. No delusions.  Depression minimal with pramipexole TID and Auvelity generally.  No manic sx     Lab Review:     Component Value Date/Time   NA 137 01/12/2021 1430   NA 140 11/18/2018 1544   K 3.8 01/12/2021 1430   CL 108 01/12/2021 1430   CO2 22 01/12/2021 1430   GLUCOSE 94 01/12/2021 1430   BUN 14 01/12/2021 1430   BUN 20 11/18/2018 1544   CREATININE 0.82 01/12/2021 1430   CALCIUM 8.9 01/12/2021 1430   PROT 6.6 01/12/2021 1430   ALBUMIN 3.9 01/12/2021 1430   AST 12 (L) 01/12/2021 1430   ALT 11 01/12/2021 1430   ALKPHOS 46 01/12/2021 1430   BILITOT 0.5 01/12/2021 1430   GFRNONAA >60 01/12/2021 1430   GFRAA >60 09/02/2019 0249   GFRAA >60 01/27/2019 0811       Component Value Date/Time   WBC 4.5 01/12/2021 1430   RBC 4.32 01/12/2021 1430   HGB 12.8 01/12/2021 1430   HGB 12.9 07/17/2019 0953   HCT 38.5 01/12/2021  1430   HCT 21.9 (L) 12/25/2018 1221   PLT 272 01/12/2021 1430   PLT 286 07/17/2019 0953   MCV 89.1 01/12/2021 1430   MCH 29.6 01/12/2021 1430   MCHC 33.2 01/12/2021 1430   RDW 12.4 01/12/2021 1430   LYMPHSABS 1.4 01/12/2021 1430   MONOABS 0.4 01/12/2021 1430   EOSABS 0.0 01/12/2021 1430   BASOSABS 0.0 01/12/2021 1430    No results found for: "POCLITH", "LITHIUM"   No results found for: "PHENYTOIN", "PHENOBARB", "VALPROATE", "CBMZ"   .res Assessment: Plan:    Recurrent major depression resistant to treatment (HCC)  Generalized anxiety disorder  Attention deficit hyperactivity disorder (ADHD), predominantly inattentive type  Insomnia due to mental condition  Accelerated hypertension   She has treatment resistant major depression ongoing with 50% better with Spravato. Have  discussed some  of her  abnormal behaviors last year leading to this depressive episode getting worse which she says were associated with heavy use of delta 8 and not a manic episode.  She realizes now that that was not good for her.  She stopped all use of other drugs including those available over-the-counter such as delta 8 or any other THC related products.  She is no longer having any of those types of behaviors and instead is depressed.  She is experiencing more pleasure and is less blunted and enjoying more and better inteest versus before the Spravato.  She also feels worse if misses a dose of Spravato.  Depression nearly resolved with pramipexole TID and relapsed when out of Auvelity.  Depression resolved recently after increasing pramipexole 0.5 mg TID, brief relapse when out of Auvelity Consider switch to ER to reduce sleepiness  Patient was administered Spravato 84 mg intranasally today.  The patient experienced the typical dissociation which gradually resolved over the 2-hour period of observation.  There were no complications.  Specifically the patient did not have nausea or vomiting or headache.   Blood pressures monitored at the 40-minute and 2-hour follow-up intervals.  Borderline high.  By the time the 2-hour observation period was met the patient was alert and oriented and able to exit without assistance.  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  See nursing note for further details.She wants to continue Spravato. We discussed discussed the side effects in detail as well as the protocol required to receive Spravato.    has failed all major categories of antidepressants except TCAs and MAO inhibitors which have not been tried until now.  Weaned off nortriptyline without much problems.  She is tolerating Auvelity.  Continue 1  BID bc more depressed without it dramatically.  Relapsed witout it.  Worse with less pramipexole. Increased to 0.5 mg TID on about end of Feb 2024 and markedly better Dosing range off label for depression ranges from 1-5 mg daily.  Disc risk compulsive impulsive behavior and mania.    Started Spravato 84 mg twice weekly on 03/16/2022.  Wants to continue weekly and feels she needs it.   Disc risk relapse if less than weekly and she doesn't seem to do well with less than weekily.  Adderall  XR 20 mg AM   Ativan 1 mg 3 times daily as needed anxiety but try to cut it back.  She still feels she needs it. Is not ideal to use benzodiazepine with stimulant but because of the severity of her symptoms it has been necessary.  Hope to eventually eliminate the benzodiazepine.  Encourage her to embrace this goal.    Continue clonidine 0.2 mg BID off label for anxiety and helps BP partially. BP is better controlled and more consistent.Rec keep track of BP and discuss with PCP. It has been up and down.  Sometimes needs extra clonidine before Spravato.  Better lately.  Discussed potential benefits, risks, and side effects of stimulants with patient to include increased heart rate,  palpitations, insomnia, increased anxiety, increased irritability, or decreased appetite.  Instructed patient to contact office if experiencing any significant tolerability issues. She wants to return to usual dose of Adderall for ADD bc of mor poor cognitive function with reduction.  Also discussed that depression will impair cognitive function.  Disc risk polypharmacy.  Relapsed after running out of Auvelity so needs clearly it and pramipexole.   She  continues walkly daily with benefit.  Has Maintained sobriety.    No med changes indicated today: Continue Adderall XR 20 mg every morning Continue Auvelity 1 tablet twice daily,  continue clonidine 0.2 mg twice daily for anxiety and blood pressure. Continue lorazepam 1 mg 3 times daily she has been unable to cut back on the dose due to anxiety. Off  nortriptyline 50 mg nightly Continue pramipexole 0.5 mg 3 times daily Continue trazodone 50 mg tablets 1-2 nightly as needed for sleep.  FU with Spravato weekly   Meredith Staggers, MD, DFAPA     Please see After Visit Summary for patient specific instructions.  Future Appointments  Date Time Provider Department Center  06/11/2023  9:30 AM Cottle, Steva Ready., MD [facility] None  06/11/2023  9:30 AM CP-NURSE [facility] None                     No orders of the defined types were placed in this encounter.      -------------------------------

## 2023-06-03 NOTE — Progress Notes (Signed)
NURSES NOTE:   Patient arrived for her weekly 84mg  Spravato treatment. Pt is being treated for Treatment Resistant Depression this is #69 treatment, pt will be receiving 84 mg which will continue to be her maintenance dose, she receives weekly treatments.  Patient arrived and taken to treatment room. Confirmed she had a ride home which is her husband would be coming back to pick up pt when done and sometimes she needs to use [facility] Goad if he is unable to pick her up. Pt's Spravato is ordered through [employer] and delivered to office, all Spravato medication is stored at doctors office per REMS/FDA guidelines. The medication is required to be locked behind two doors per FDA/REMS Protocol. Medication is also disposed of properly per regulations. Pt's prior authorization for Spravato 84 mg was renewed with an approval through 07/17/2023 with [facility]. All treatments with vital signs documented with Spravato REMS per protocol of being a treatment center.    Pt reports feeling very good, she is getting out a lot more and enjoying things. Her and her family are going to the beach for a long weekend.  Pt is doing extremely well. Very talkative today. Began taking patient's vital signs at 9:50 AM 110/80, pulse 63. Pulse Ox 99%. Gave patient first dose 28 mg nasal spray, each nasal spray administered in each nostril as directed and waited 5 minutes between the second and third dose. All 3 doses given pt did not complain of any nausea/vomiting, given a cup of water due to the taste after the administration of Spravato.  She listens to [employer] with spa or relaxing music. Pt is not sedate today, and her vital signs are stable. Checked 40 minute vitals at 10:45 AM, 111/71, pulse 61, Pulse Ox 92%. Explained she would be monitored for a total time of 120 minutes. Discharge vitals were taken at 12:05 PM 146/95 P 54, 99% Pulse Ox. Dr. Jennelle Human met with pt and discussed her treatment. Recommend she go home and sleep or just  relax on the couch. No driving, no intense activities. Verbalized understanding. Nurse was with pt a total of 70 minutes for clinical. Pt will be scheduled, next Tuesday, July 30th.. Pt instructed to call office with any problems or questions.      LOT [id number] EXP APR 2027

## 2023-06-07 ENCOUNTER — Other Ambulatory Visit: Payer: Self-pay | Admitting: Psychiatry

## 2023-06-09 ENCOUNTER — Encounter: Payer: Self-pay | Admitting: Psychiatry

## 2023-06-11 ENCOUNTER — Ambulatory Visit (INDEPENDENT_AMBULATORY_CARE_PROVIDER_SITE_OTHER): Payer: 59 | Admitting: Psychiatry

## 2023-06-11 ENCOUNTER — Ambulatory Visit: Payer: 59

## 2023-06-11 ENCOUNTER — Encounter: Payer: Self-pay | Admitting: Psychiatry

## 2023-06-11 VITALS — BP 116/69 | HR 51

## 2023-06-11 DIAGNOSIS — F5105 Insomnia due to other mental disorder: Secondary | ICD-10-CM | POA: Diagnosis not present

## 2023-06-11 DIAGNOSIS — F900 Attention-deficit hyperactivity disorder, predominantly inattentive type: Secondary | ICD-10-CM

## 2023-06-11 DIAGNOSIS — F339 Major depressive disorder, recurrent, unspecified: Secondary | ICD-10-CM

## 2023-06-11 DIAGNOSIS — I10 Essential (primary) hypertension: Secondary | ICD-10-CM

## 2023-06-11 DIAGNOSIS — F411 Generalized anxiety disorder: Secondary | ICD-10-CM | POA: Diagnosis not present

## 2023-06-11 MED ORDER — TRAZODONE HCL 50 MG PO TABS
ORAL_TABLET | ORAL | 1 refills | Status: DC
Start: 2023-06-11 — End: 2023-12-30

## 2023-06-11 MED ORDER — PRAMIPEXOLE DIHYDROCHLORIDE 0.5 MG PO TABS
0.5000 mg | ORAL_TABLET | Freq: Three times a day (TID) | ORAL | 1 refills | Status: DC
Start: 2023-06-11 — End: 2023-09-02

## 2023-06-11 MED ORDER — LORAZEPAM 1 MG PO TABS
1.0000 mg | ORAL_TABLET | Freq: Three times a day (TID) | ORAL | 1 refills | Status: DC | PRN
Start: 2023-06-11 — End: 2023-07-11

## 2023-06-11 MED ORDER — AUVELITY 45-105 MG PO TBCR
1.0000 | EXTENDED_RELEASE_TABLET | Freq: Two times a day (BID) | ORAL | 3 refills | Status: DC
Start: 2023-06-11 — End: 2023-07-31

## 2023-06-11 NOTE — Progress Notes (Signed)
Laura Chang [id number] [birth year] 67 y.o.  Subjective:   Patient ID:  Laura Chang is a 67 y.o. (DOB 1956/08/31) female.  Chief Complaint:  Chief Complaint  Patient presents with   Follow-up   Depression   Anxiety   ADD   Sleeping Problem      Laura Chang presents to the office today for follow-up of depression and anxiety and ADD.  seen November 12, 2019.  Melted down in 2020.  Went to [facility] in July.  No withdrawal.  1 drink since then.  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 She was on no stimulant and no SSRI but was taking Strattera and Wellbutrin.  The following changes were made. Stop Strattera. OK restart stimulant bc severe ADD Restart Adderall 1 daily for a few days and if tolerated then restart 1 twice daily. If not tolerated reduce the dosage if needed. May need to stop Wellbutrin if not tolerating the stimulant.  Yes.  DC Wellbutrin Restart Prozac 20 mg daily.  February 2021 appointment with the following noted: Completed grant proposal.  Couldn't doit without Adderall.  Sold a bunch of work.   Adderall XR lasts about 3 pm.  Strength seems about right.  BP been OK.  Not jittery.   Stopped Wellbutrin but had no SE. Mood drastically better with grant proposal and back on fluoxetine.  Less depressed and lethargic.  No anxiety.  Cut back on coffee. Started back with devotions and stronger faith. Plan: Continue Prozac 20 mg daily. May have to increase the dose at some point in the future given that she usually was taking higher dosages but she is getting good response at this time. Restart Wellbutrin off label for ADD since can't get 2 ADDERALL daily. 150 mg daily then 300 mg daily. She can adjust the dose between 150 mg and 300 mg daily to get the optimal effect.   05/11/2020 appointment with the following noted: Has been inconsistent with Prozac and Wellbutrin. Not sure of the effect of Wellbutrin. Biggest deterrent in  work is anxiety.  Some of the work is conceptual and difficult at times.  Can feel she's not up to a project at times.  Overall is OK but would like a steadier benefit from stimulant.  Exhausted from managing concentration and keeping up with things from the day.  Loses things.  Not good keeping up with schedule. Overall productive and emotionally OK. Can feel Adderall wear off. Mood is better in summer and worse in the winter.   F died in 10/06/23 and that is a loss. No SE Wellbutrin. Still attends AA meetings.  Real benefit from [facility] last year. Recognizes effect of anemia on ADD and mood.  Had iron infusions last winter. Plan:  Wellbutrin off label for ADD since can't get 2 ADDERALL daily. 150 mg daily then 300 mg daily.  01/24/2021 appointment with following noted: Doing a program called [employer] mindfulness app since Xmas.  CBT app helped the depression.  App helped her focus better.  Lost sign weight. Writing a lot. Before Xmas felt depressed and started negative thinking worse, self denigrating. Not drinking. More isolated.   Recognizes mo is narcissist.    Didn't tell anyone she was born until 3 mos later.  M aloof and uninterested in pt.  Lied about her birthday.  Mo lack of affection even with pt's kids. Going to AA for a year and it helped her to quit drinking. Also  misses kids being gone with a hole also. Plan: No med changes  05/04/2021 appointment with the following noted: Therapist [name] thinks she's manic. Lost weight to 144#.   States she is still sleeping okay.  Admits she is hyper and recognizes that she is likely manic.  She feels great, euphoric with an increased sense of spiritual connectedness to God.  She has racing thoughts and talks fast and talks a lot and this is noted by her husband.  He thinks she is a bit hyper.  She has been able to maintain sobriety although she will have 1 glass of wine on special occasions but does not drink by herself.  She  is not drinking to excess.  She denies any dangerous impulsivity.  She is clearly not depressed and not particularly anxious.  She has no concerns about her medication and she has been compliant.  06/16/21 appt noted: So much better.  Going through a lot but the manic thing happened on top of it.  So much slower.  Didn't feel like losing anything with risperidone.  Likes the Adderalll at 10 mg. Some drowsiness in the AM and very drowsy from risperidone 2 mg HS. Prayer life is better. Handling stress better. Less depressed with risperidone. Still likes trazodone. Sleeps well. Plan: Reduce Prozac to 10 mg daily.  Consider stopping it because it can feel the mania however she is reluctant to do that because she fears relapse of depression. Reduce risperidone to 1.5 mg nightly due to side effects.  Discussed risk of worsening mania.  07/25/2021 appointment with the following noted: Misses the Adderall and hard to function without it. Depressed now. Heavy chest.  Anxious and guilty.  Body feels heavy.   Hates Wellbutrin.   Plan: Increase fluoxetine to 20 mg daily Add Abilify 1/2 of 15 mg tablet daily Wean wellbutrin by 1 tablet each week  bc she feels it is not helpful and DT polypharmacy Reduce risperidone to 1 daily for 1 week and stop it. Disc risk of mania. Increase Adderall to XR 20 mg AM  08/08/21 Much less depressed and starting to feel normal I feel a lot better. No SE.  Speech normal off risperidone. Sleeping OK on trazaodone and enough.   Noticed benefit from Adderall again. Plan: continue fluoxetine to 20 mg daily Continue Abilify 1/2 of 15 mg tablet daily for depression and mania continue Adderall to XR 20 mg AM  10/10/2021 phone call: Pt stated she feels like the Abilify should be decreased to 5mg .She said she is depressed but rational and not suicidal.She has an appt Monday and can wait until then if you prefer. MD response: Reduce the Abilify to 7.5 mg every other day.  We will  meet on 10/16/2021 and decide what to do from there.  10/16/2021 appointment with the following noted: More depressed.  Most depressed I've ever been.  Just numb.  Sense of grief.   Thinks the manic episode was unlike anything else she ever had.  Doesn't want to medicate against it.  Don't enjoy people.  Easily overwhelmed.  Had some death thoughts but not suicidal.  Has been functional.  Feels better today after reducing Abilify to every other day but she is only been doing that for 3 days. A/P: Episode of post manic depression was explained. continue fluoxetine to 20 mg daily Hold Abilify for 1 week then resume Abilify 1/2 of 15 mg tablet every other day for depression and mania continue Adderall to XR 20  mg AM  10/27/2021 appointment with the following noted: I'm doing so much better.  Handling the depression better. Better self talk and spiritual focus has helped.   Dep 6/10 manifesting as anxiety with low confidence.   F died 2  years ago and M 67 yo and is dependent . She is working hard to feel better but still feels depressed.  She almost feels like she has a little more anxiety since restarting Abilify every other day. Plan: continue fluoxetine to 20 mg daily DC Abilify .  Vrayalar 1.5 mg QOD to try to get rid of depression ASAP. continue Adderall to XR 20 mg AM  11/10/2021 appointment with the following noted: Busy with Xmas and it was fun with family but then a big let down.  Did well with it.  Functioned well with it.  Working hard on things with depression.  Not shutting down. Not sure but feels better today but yesterday was hard.  Difficulty dealing with mother.  She won't do anything to help herself.  Yesterday with her all day.  Won't do PT and has isolated herself.    Lack of confidence.   No SE with Vraylar.  11/24/21 urgent appointment appt noted: More and more depressed.   So anxious and doesn't want to be alone but can do so. No appetite. Hurts inside. Has had some  fleeting suicidal thoughts but would not act on them.  Tolerating meds. Has been consistent with Vraylar 1.5 mg every other day, fluoxetine 20 mg daily Plan: Increase Vraylar to 1.5 mg daily Change Prozac to Trintellix 10 mg daily. Discussed side effects of each continue Adderall to XR 20 mg AM  12/27/2021 appointment with the following noted: Not OK.  I feel less depressed but feels bat shit. Not sleeping well.  Extremely anxious. Off and on sleep. 3-4 hours of sleep.   Still having daily SI.  But also become obvious has so much to do.  Overwhelmed by tasks.   Needs anxiety meds to just function. Not more motivated.  Walked yesterday.   Feels afraid like in trouble but not irritable or angry. DC DT agitation Vraylar to 1.5 mg daily Change Prozac to Trintellix 10 mg daily. Hold Adderall to XR 20 mg AM Clonidine 0.1 1/2 tablet twice daily for 2 days and if needed for anxiety and sleep increase to 1 twice daily Ok temporary Ativan 1 mg 3 times daily as needed anxiety  01/05/22 appt noted: Off fluoxetine and  Trintellix.  Only on Ativan, trazodone and Adderall XR 20 plus added clonidine 0.1 mg BID Didn't think she needed to start Trintellix. Not taking Ativan.   Didn't like herself last week. Feels some better today. Wonders if the manic sx Not agitated.  Anxiety kind of calmed down.  A lot to be anxious about situationally.  $ stress. Concerns about downers with meds. Can't access normal personality. ? Lethargy and inability to talk as sE. Plan: Latuda 20-40 mg daily with food. Adderall to XR 20 mg AM Clonidine 0.1 1/2 tablet twice daily  reduce dose to be sure no SE Ok temporary Ativan 1 mg 3 times daily as needed anxiety  01/19/22 appt noted: Taking Latuda 20 mg daily.  Took 40 mg once and felt anxious and  SI Still depressed and not very reactive Anxiety mainly about the depression and fears of the future. She wants to revisit manic sx and thinks it was maybe bc taking delta 8  bc was taking a lot  of it so still doesn't think she's classic bipolar. She wants to only take Prozac bc thinks Latuda is perpetuating depression. Says the delta 8 was very psychaedelic.  When not taking it was not manic.  Sleeping ok again.  Plan: Per her request DC Latuda 20-40 mg daily with food. She wants to continue Prozac alone AMA  Adderall to XR 20 mg AM Clonidine 0.1 1/2 tablet twice daily  reduce dose to be sure no SE Ok temporary Ativan 1 mg 3 times daily as needed anxiety  01/23/2022 phone call complaining of increased anxiety since stopping Latuda.  She will try increasing clonidine.  01/26/2022 phone call not feeling well and wanted to restart the Vraylar.  However notes indicate that had made her agitated therefore she was encouraged to pick up samples of Rexulti 1 mg and start that instead.  02/06/2022 phone call: Stating she felt the Rexulti was helping with depression but she was not sleeping well and obsessing over things.  She was encouraged to increase Rexulti to 2 mg daily and increase trazodone for sleep.  02/09/2022 appointment with the following noted: This was an urgent work in appointment No sleep last night with trazodone 100 mg HS Nothing really better depression or anxiety. Ruminating negative anxious thoughts. Did not tolerate Rexulti because it was causing insomnia.  Does not think it helped depression.  Lacks emotion that she should have.  Lacks her usual personality.  Some hopeless thoughts.  Some death thoughts.  Some suicidal thoughts without plan or intent Plan: DC Rexulti and Prozac & DC trazodone Adderall to XR 20 mg AM Clonidine 0.1 1/tablet twice daily  reduce dose to be sure no SE Ok temporary Ativan 1 mg 3 times daily as needed anxiety Start Seroquel XR 150 mg nightly  03/02/2022 appointment: Angelique Blonder called back a few days after starting Seroquel stating it was making her more anxious and more depressed.  This seemed unlikely as this medicine rarely  ever causes anxiety.  She stopped the medication waited 3 days and called back still had anxiety and depression but thought perhaps the anxiety was a little better.  She did not want to take the Seroquel. She knew about the option of Spravato and wanted to pursue that. Now questions whether to return to Seroquel while waiting to start Spravato bc feels just as bad without it and knows she didn't give it enough time to work.   MADRS 46  ECT-MADRS    Flowsheet Row Clinical Support from 01/08/2023 in [facility] Clinical Support from 08/06/2022 in [facility] Clinical Support from 07/04/2022 in [facility] Clinical Support from 05/21/2022 in [facility] Office Visit from 03/02/2022 in [facility]  MADRS Total Score 21 29 15 27  46      03/14/22 appt noted: Pt received Spravato 56 mg first dose today with some dissociative sx which were not severe.  She was anxious prior to the administration and felt better after receiving lorazepam 1 mg.  No NV, or HA. Wants to continue Spravato. Ongoing depression and desperate to feel better.  I'm not myself DT deprsssion which is most severe in recent history.  Anhedonia.  Low motivation.  Social avoidance. Continues to think all recent med trials are making her worse.  Sleep ok with Seroquel.  03/16/22 appt noted: Received Spravato 84 mg for the first time.  some dissociative sx which were not severe.  She  was anxious prior to the administration and felt better after receiving lorazepam 1 mg.  No NV, or HA. Wants to continue Spravato.   Does not feel any better or different since the last appt.  Ongoing depression.  Ongoing depression and desperate to feel better.  I'm not myself DT deprsssion which is most severe in recent history.  Anhedonia.  Low motivation.  Social avoidance. Continues to think all recent med trials are  making her worse.  Sleep ok with Seroquel.  Does not want to continue Seroquel for TRD.  03/20/2022 appointment noted: Came for Spravato administration today.  However blood pressure was significantly elevated approximately 180/115.  She was given lorazepam 1 mg and clonidine 0.2 mg to try to get it down. She states she regretted stopping the Seroquel XR 300 mg tablets.  She now realizes it was helpful.  She did not sleep much at all last night.  She did not take the Adderall this morning. 2 to 3 hours after arrival blood pressure was still elevated at  170/110, 62 pulse.  For Spravato administration was canceled for today.  She admits to being anxious and depressed.  She is not suicidal.  She is highly motivated to receive the Spravato.  We discussed getting it tomorrow.  03/22/2022 appointment noted: Patient's blood pressure was never stable enough yesterday in order to get her in for Spravato administration.  She was encouraged to see her primary care doctor.  It is better today.  03/26/2022 appointment with the following noted: Blood pressure was better.  Saw her primary care doctor who started on oral Bystolic 2.5 mg daily. Received Spravato 84 mg today as scheduled.  Tolerated it well without nausea or vomiting headache or chest pain or palpitations.  Her blood pressure was borderline but manageable. She remains depressed and anxious.  She is ambivalent about the medicine and desperate to get to feel better.  Continues to have anhedonia and low energy and low motivation and reduced ability to do things.  Less social.  Not suicidal.  03/28/22 appt noted: Received Spravato 84 mg today as scheduled.  Tolerated it well without nausea or vomiting headache or chest pain or palpitations.  Her blood pressure was borderline but manageable. Has not seen any improvement so far.  Tolerating Seroquel.  Inconsistent with Bystolic and BP has been borderline high. Still depressed and anxious and anhedonia.  Low  motivation, energy, productivity. Taking quetiapine and tolerating XR 300 mg nightly.  04/04/22 appt noted: Received Spravato 84 mg today as scheduled.  Tolerated it well without nausea or vomiting headache or chest pain or palpitations.  Her blood pressure was borderline but manageable. Has not seen any improvement so far.  Tolerating Seroquel.   She still tends to think that the medications are making her worse.  She has said this about each of the recent psychiatric medicines including Seroquel.  However her husband thinks she is improved.  She also admits there is some improvement in productivity.  She still feels highly anxious.  She still does not enjoy things as normal.  She still feels desperate to improve as soon as possible. Has been taking Seroquel XR since 03/20/2022  04/10/22 appt noted: Received Spravato 84 mg today as scheduled.  Tolerated it well without nausea or vomiting headache or chest pain or palpitations.  Her blood pressure was borderline but manageable. Has not seen any improvement so far.  Tolerating Seroquel.  Doesn't like Seroquel bc she thinks it flattens here. 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  Tolerated it well without nausea or vomiting headache or chest pain or palpitations.  Her blood pressure was borderline but manageable. Has not seen any improvement so far.  Tolerating Seroquel.  Doesn't like Seroquel bc she thinks it flattens her. Received Spravato 84 mg today as scheduled.  Tolerated it well without nausea or vomiting headache or chest pain or palpitations.  Her blood pressure was borderline but manageable. Has not seen any improvement so far.  Tolerating Seroquel.  Doesn't like Seroquel bc she thinks it flattens here.  We discussed her ambivalence about it. She is starting Auvelity  and has tolerated it the last 2 days without side effect.  She still does not feel like herself and feels flat and not enjoying things with suppressed expressed emotion  04/17/2022 appointment with the following noted: Received Spravato 84 mg today as scheduled.  Tolerated it well without nausea or vomiting headache or chest pain or palpitations.  Her blood pressure was borderline but manageable. Has not seen any improvement so far.  Tolerating Seroquel.  Doesn't like Seroquel bc she thinks it flattens her. She has been tolerating the Auvelity 1 in the morning without side effects for about a week.  She has not noticed significant improvement so far.  She still feels depressed and flat and not herself.  Other people notice that she is flat emotionally.  She is not suicidal.  She does feel discouraged that she is not getting better yet.  04/19/2022 appointment noted: Has increased Auvelity to 1 twice daily for 2 days, continues quetiapine XR 300 mg nightly, clonidine 0.3 mg twice daily, lorazepam 1 mg twice daily for anxiety and Adderall XR 20 mg in the morning. No obious SE but she still thinks quetiapine XR is making her feel down.  But not sedated Received Spravato 84 mg today as scheduled.  Tolerated it well without nausea or vomiting headache or chest pain or palpitations.  Her blood pressure was borderline but manageable. She still feels quite anxious and feels it necessary to take both the clonidine and lorazepam twice a day to manage her anxiety.  She has been consistently down and flat and not herself until yesterday afternoon she noted an improvement in mood and feeling much more like herself with her normal personality reemerging.  She was quite depressed in the morning with very dark negative thoughts.  She did not have those dark negative thoughts this morning.  She had a lot of questions about medication and when she was expecting to be improved and why she has not shown improvement up to  now.  04/23/22 appt noted: Has increased Auvelity to 1 twice daily for 1 week, continues quetiapine XR 300 mg nightly, clonidine 0.3 mg twice daily, lorazepam 1 mg twice daily for anxiety and Adderall XR 20 mg in the morning. No obious SE but she still thinks quetiapine XR is making her feel down.  But not sedated Received Spravato 84 mg today as scheduled.  Tolerated it well without nausea or vomiting headache or chest pain or palpitations.  She is still depressed but admits better function and is able to enjoy social interactions. Tolerating meds.  Would like to feel better for sure. Not herself.  Flat. Plan increase Auvelity to 1 tab BID as planned and reduce Quetiapine to 1/2 of ER 300 mg  bc  NR for depression.  04/25/2022 appointment with the following noted: clonidine 0.3 mg twice daily, lorazepam 1 mg twice daily for anxiety and Adderall XR 20 mg in the morning. Seroquel XR 300 HS No obious SE but she still thinks quetiapine XR is making her feel down.  But not sedated Received Spravato 84 mg today as scheduled.  Tolerated it well without nausea or vomiting headache or chest pain or palpitations.  Called yesterday with more anxiety.  Had increased Auvelity for 1 day and reduced Seroquel XR for 1 day.  Felt restless and fearful  05/01/2022 appointment noted: clonidine 0.3 mg twice daily, lorazepam 1 mg twice daily for anxiety and Adderall XR 20 mg in the morning. Seroquel XR 150 HS, Auvelity 1 BID Received Spravato 84 mg today as scheduled.  Tolerated it well without nausea or vomiting headache or chest pain or palpitations.  Nurse has noted patient has called multiple times sometimes asking the same question repeatedly.  It is unclear whether she is truly forgetful or is just anxious seeking reassurance. Patient acknowledges ongoing depression as well as some anxiety but states she has felt a little better in the last couple of days.  She has reduced the Seroquel to 150 mg at night and has  increased Auvelity to 1 twice daily but only for 1 day.  So far she seems to be tolerating it.  05/03/22 appt noted: clonidine 0.2 mg twice daily, lorazepam 1 mg twice daily for anxiety and Adderall XR 20 mg in the morning. Seroquel XR 150 HS, Auvelity 1 BID BP high this am about 170/100 and received extra clonidine 0.2 mg and came to receive Spravato.  Not dizzy, no SOB, nor CP but BP is still high Could not receive Spravato today bc BP high and pulse low at 30 ppm. Still depressed and anxious. Plan: continue trial Auvelity with Spravato She needs to get BP and pulse managed  05/08/22 TC: RTC  H Michael NA and mailbox full.  Could not leave message.  Pt  -  talked to she and H on speaker. H worried over wife.  Vacant stare.  Slurs words at times.  Not smiling. Reduced enjoyment.  Depression.  Withdrawn from usual activities.  Some irritability.  Anxious. Disc her concerns meds are making her worse.  Extensive discussion about her treatment resistant status.  There is a consistent pattern of not taking the medicines long enough to get benefit because she believes the meds are making her worse.  However the symptoms she describes as side effects are exactly the same symptoms that she had prior to taking the medication RX for  the depression.  So it is not clear that these are actual side effects. This is true about the 2 most recent meds including Seroquel and Auvelity.  Recommend psychiatric consultation in hopes of improving her comfort level with taking prescribed medications for a sufficient length of time to provide benefit. Extensive discussion about ECT is the treatment of choice for treatment resistant depression.  Spravato may work if she can comply with consistency.  There are medication options but they take longer to work.   Plan:  Reduce clonidine to 0.1 mg BID DT bradycardia.  Talk with PCP about BP and low pulse problems which are interfering with her consistent compliance with  Spravato.   Limit lorazepam to 3 -4  mg daily max. Excess use is the cause of slurring speech.  She must stop excess use or will have to stop the  med. Stop Auvelity per her request.  But she has only been on the full dose for a little over a week and clearly has not had time to get benefit from it.  She thinks maybe it is making her more anxious. Reduce Seroquel from 150XR to 50 -100 mg at night IR.  She couldn't sleep when stopped it completely. Will not start new antidepressant until her SE issues are resolved or not. Get second psych opinion from Wellington Hampshire MD or another psychiatrist.  H's sister is therapist in [place] Rink, MD, [facility]  05/16/2022 appointment with the following noted: Received Spravato 84 mg today as scheduled.  Tolerated it well without nausea or vomiting headache or chest pain or palpitations.  She stopped Auvelity as discussed last week. On her own, without physician input, she restarted Wellbutrin XL 450 mg every morning today.  She had taken it in the past.  She feels jittery and anxious. She feels less depressed than she did last week.  But she is still depressed without her usual range of affect.  She still is less social and less motivated than normal. Her primary care doctor increased the dose of losartan Plan: Stop Seroquel Reduce Wellbutrin XL to 300 mg every morning.  Starting the dose at 450 every morning is likely causing side effects of jitteriness and it should not be started at that have a dosage. Recommend she not change meds on her own without MDM put  05/23/2022 appointment with the following noted: Received Spravato 84 mg today as scheduled.  Tolerated it well without nausea or vomiting headache or chest pain or palpitations.  Has not dropped seroquel XR 300 mg 1/2 tablet nightly bc couldn't sleep without it. Has not tried lower dose quetiapine 50 mg HS Still feels depressed.   BP is better managed so far, just saw PCP.  BP is better today and  infact is low today. Dropped clonidine as directed from 0.3 mg BID bc inadequate control of BP to 0.2 mg BID.  However she wants to increase it back to 0.3 mg twice daily because she feels it helped her anxiety better.  Wonders about increasing Wellbutrin for depression.  However she has only been on 300 mg a day for a week.  She was on 450 mg daily in the past.  06/06/22 appt noted: Received Spravato 84 mg today as scheduled.  Tolerated it well without nausea or vomiting headache or chest pain or palpitations.  She is still depressed and anxious.  She wants to try to stop the Seroquel but cannot sleep without some of it.  She is taking lorazepam 1 mg 4 times daily and still having a lot of anxiety.  She wants to increase clonidine back to 0.3 mg twice daily.  She hopes for more improvement She recently went for a second psychiatric opinion as suggested the results of that are pending.  06/11/22 appt noted: Received Spravato 84 mg today as scheduled.  Tolerated it well without nausea or vomiting headache or chest pain or palpitations.  She is still depressed and anxious. Without much change.  Still hopeless, anhedonia, reduced inteterest and motivation.  Tolerating meds. Disc concerns Spravato is not hleping much. Plan: stop Seroquel and start olanzapine 10 mg HS for TRD and anxiety.  06/13/2022 appointment noted: Received Spravato 84 mg today as scheduled.  Tolerated it well without nausea or vomiting headache or chest pain or palpitations.  She is still depressed and anxious. Without much change.  Still  hopeless, anhedonia, reduced inteterest and motivation.  Tolerating meds. Disc concerns Spravato is not helping much as hoped but is improving a bit in the last week. Tolerating meds. Continues Wellbutrin XL 450 AM, tolerating recently started olanzapine  10 mg HS. Sleep is good.   Pending appt with TMS consult.  06/18/22 appt noted: Received Spravato 84 mg today as scheduled.  Tolerated it well  without nausea or vomiting headache or chest pain or palpitations.  Tolerating meds. Continues Wellbutrin XL 450 AM, tolerating recently started olanzapine  10 mg HS. Continues Adderall XR 20 amd and has tried to reduce lorazepam to 1mg  TID Sleep is good.   Pending appt with TMS consult. Depression is a little bit better in the last week with a little improvement in emotional expression and interest.  She is pushing herself to be more active.  Her daughter thought she was a little better than she has been.  However she is still depressed and still not her normal self with anhedonia and reduced emotional expressiveness.  06/20/22 appt noted: Received Spravato 84 mg today as scheduled.  Tolerated it well without nausea or vomiting headache or chest pain or palpitations.  Tolerating meds with a little sleepiness. Continues Wellbutrin XL 450 AM, tolerating recently started olanzapine  10 mg HS. Continues Adderall XR 20 amd and has tried to reduce lorazepam to 1mg  TID Sleep is good.   Mood is improving.  Better funciton.  Anxiety is better with olanzapine. Still not herself and depression not gone with some anhedonia and social avoidance and feeling overwhelmed.  8/14 2023 received Spravato 84 mg 06/27/2022 received Spravato Spravato 84 mg 07/02/2022 received Spravato 84 mg 07/04/2022 received Spravato 84 mg  07/09/2022 appointment noted: Received Spravato 84 mg today as scheduled.  Tolerated it well without nausea or vomiting headache or chest pain or palpitations.  Expected dissociation and feels less depressed with resolution of negative emotions immediately after Spravato and then depression, anxiety creep back in. Continues meds Adderall XR 20 mg every morning, Wellbutrin XL 450 every morning, clonidine 0.1 mg twice daily, lorazepam 1 mg every 6 hours as needed, olanzapine increased from 7.5 to 10 mg nightly on  Tolerating meds.  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  She does note some increased appetite and is somewhat concerned about that but has not gained significant amounts of weight. She has had the TMS consultation which was initially denied but she knows it can be appealed.  However because she is improving with Spravato plus the other medications now she wants to continue the current treatment plan.  07/18/22 appt noted: Continues meds Adderall XR 20 mg every morning, Wellbutrin XL 450 every morning, clonidine 0.1 mg twice daily, lorazepam 1 mg every 6 hours as needed, olanzapine increased from 7.5 to 10 mg nightly on 07/04/2022. Received Spravato 84 mg today as scheduled.  Tolerated it well without nausea or vomiting headache or chest pain or palpitations.  Expected dissociation and feels less depressed with resolution of negative emotions immediately after Spravato and then depression, anxiety creep back in. Continues meds Adderall XR 20 mg every morning, Wellbutrin XL 450 every morning, clonidine 0.1 mg twice daily, lorazepam 1 mg every 6 hours as needed, olanzapine increased from 7.5 to 10 mg nightly on  Tolerating meds.  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  She does note some increased appetite and is somewhat concerned about that but has not gained significant amounts of weight. She has had the TMS consultation which was initially denied but she knows it can be appealed. She continues to have chronic ambivalence about psychiatric medicines and initially tends to blame her depressive symptoms such as decreased concentration and feeling flat on what ever medicine she currently is taking even though she had the same symptoms before the current medicines were started.  Then after discussion she does admit that her depressive  symptoms are improved since adding olanzapine but still has those residual symptoms noted.  07/23/22 received Spravato 84 mg   07/30/2022 appointment noted: Received Spravato 84 mg today as scheduled.  Tolerated it well without nausea or vomiting headache or chest pain or palpitations.  Expected dissociation and feels less depressed with resolution of negative emotions immediately after Spravato and then depression, anxiety creep back in. Continues meds Adderall XR 20 mg every morning, Wellbutrin XL 450 every morning, clonidine 0.1 mg twice daily, lorazepam 1 mg every 6 hours as needed, olanzapine increased from 7.5 to 10 mg nightly on  She has been inconsistent with olanzapine because she continues to be ambivalent about the medications in general and thinks that perhaps the 10 mg is making her feel blunted.  She continues to feel some depression.  She had a good day this week and but still feels somewhat depressed and persistently anxious. Plan: be consistent with olanzapine 10 mg HS for TRD and longer trial for potential benefit for anxiety.  Has not taken it consistently.  08/06/2022 appointment noted: Received Spravato 84 mg today as scheduled.  Tolerated it well without nausea or vomiting headache or chest pain or palpitations.  Expected dissociation and feels less depressed with resolution of negative emotions immediately after Spravato and then depression, anxiety creep back in. Continues meds Adderall XR 20 mg every morning, Wellbutrin XL 450 every morning, clonidine 0.1 mg twice daily, lorazepam 1 mg every 6 hours as needed, olanzapine i 10 mg nightly  She continues to feel depressed but is about 50% better with Spravato.  She is still not herself.  She still has anhedonia.  She still is not her able to engage socially in the typical ways.  She is not jovial and outgoing like normal.  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  She continues to feel that her personality is dampened down.  There is a question about whether it is related to depression or medication. Plan: continue olanzapine 10 for longer trial for TRD and severe anxiety.  08/13/22 appt noted:  Received Spravato 84 mg today as scheduled.  Tolerated it well without nausea or vomiting headache or chest pain or palpitations.  Expected dissociation and feels less depressed with resolution of negative emotions immediately after Spravato and then depression, anxiety creep back in. Continues meds Adderall XR 20 mg every morning, Wellbutrin XL 450 every morning, clonidine 0.1 mg twice daily, lorazepam 1 mg every 6 hours as needed, olanzapine i 10 mg nightly  She still does not feel herself.  Still struggles with depression and low motivation and reduced social engagement and reduced interest and reduced emotional expression.  She is somewhat better with the medicines plus Spravato.  She still believes the [employer]  makes her blunted and is not sure how much it helps her anxiety.  She can have good days when her family is around and she is engaged.  She still wants to stop the olanzapine. She has apparently continued to take the trazodone despite having been told to stop it when she started olanzapine.  She feels like she needs the trazodone. Plan: DC olanzapine and Start nortriptyline 25 mg nightly and build up to 75 mg nightly and then check blood level.    08/27/2022 appointment noted: Received Spravato 84 mg today as scheduled.  Tolerated it well without nausea or vomiting headache or chest pain or palpitations.  Expected dissociation and feels less depressed with resolution of negative emotions immediately after Spravato and then depression, anxiety creep back in. Continues meds Adderall XR 20 mg every morning, Wellbutrin XL 450 every morning, clonidine 0.1 mg twice daily, lorazepam 1 mg every 6 hours as needed. Stopped  olanzapine and started nortriptyline which she has taken for about a week is 75 mg nightly. So far she is tolerating the nortriptyline well with the exception of some dry mouth and constipation which she is working to manage.  She does not feel substantially better better or different off the olanzapine.  No change in her sleep which is good.  Main concern currently in addition to the residual depression is anxiety which is somewhat situational with pending arch show.  She is worrying about it more than normal.  Says she is having to take lorazepam twice a day where she had been able to keep reduce it prior to this.  She still does not feel like herself with residual depression with less social interest and less of her usual buoyancy in personality.  She is flatter than normal.  Overall she still feels that the Spravato has been helpful at reducing the severity of the depression.  She is not suicidal. She has not heard anything about the TMS appeal as of yet.  09/05/2022 appointment noted: Received Spravato 84 mg today as scheduled.  Tolerated it well without nausea or vomiting headache or chest pain or palpitations.  Expected dissociation and feels less depressed with resolution of negative emotions immediately after Spravato and then depression, anxiety creep back in. Continues meds Adderall XR 20 mg every morning, Wellbutrin XL 450 every morning, clonidine 0.1 mg twice daily, lorazepam 1 mg every 6 hours as needed. Stopped olanzapine and started nortriptyline which she has taken for about 2 week is 75 mg nightly. Initially blood pressure was a little high causing delay in starting Spravato.  She admitted to feeling a little wound up.  She still experiences a little increase in depression if she goes longer than a week in between doses of Spravato.  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  09/10/22 appt noted: Received Spravato 84 mg today as scheduled.  Tolerated it well without nausea or vomiting headache or chest pain or palpitations.  Expected dissociation gradually resolved over the 2 hour observation period. She feels 50% less depressed with Spravto and wants to continue it.   Continues meds Adderall XR 20 mg every morning, Wellbutrin XL 450 every morning, clonidine 0.1 mg twice daily, lorazepam 1 mg every 6 hours as needed. Has started nortriptyline 75 mg nightly for about 3 weeks. Has not seen a significant difference with the addition of nortriptyline.  Tolerating it  pretty well. She continues to have some degree of anhedonia and significant depression and anxiety.  Her daughters noticed that she is more needy and calls more frequently.  She acknowledges this as well.  She is clearly still not herself. Plan: pramipexole off label and RX 0.25 mg BID  09/17/2022 appointment noted: Received Spravato 84 mg today as scheduled.  Tolerated it well without nausea or vomiting headache or chest pain or palpitations.  Expected dissociation gradually resolved over the 2 hour observation period. She feels 50% less depressed with Spravto and wants to continue it.   Continues meds Adderall XR 20 mg every morning, Wellbutrin XL 450 every morning, clonidine 0.1 mg twice daily, lorazepam 1 mg every 6 hours as needed. Has started nortriptyline 75 mg nightly for about 3 weeks and DT level 176, reduced to 50 mg HS early November. Still the same sx as noted last visit.  Tolerating meds.   Compliant.  Still depressed and family notices.  Has been able to participate in family interactions.  Some post-show let down and has to do detailed work which is hard for her bc ADD.  Sleep and eating well.  Energy OK but not great.  No SI.  Not cried in a year or so.  Clearly less depressed and hopeless than before the Spravato.  09/24/22 appt noted: Received Spravato 84 mg today as scheduled.   Tolerated it well without nausea or vomiting headache or chest pain or palpitations.  Expected dissociation gradually resolved over the 2 hour observation period. She feels 50% less depressed with Spravto and wants to continue it.   Continues meds Adderall XR 20 mg every morning, Wellbutrin XL 450 every morning, clonidine 0.1 mg twice daily, lorazepam 1 mg every 6 hours as needed. Has started nortriptyline 75 mg nightly for about 3 weeks and DT level 176, reduced to 50 mg HS early November. Still the same sx as noted last visit.  Tolerating meds.   Compliant.  Still depressed and family notices.  Has been able to participate in family interactions.  Some post-show let down and has to do detailed work which is hard for her bc ADD.  Sleep and eating well.  Energy OK but not great.  No SI.  Not cried in a year or so.  Clearly less depressed and hopeless than before the Spravato. Is not making further progress generally.  Stuck with moderate depression  10/02/22 appt noted: Received Spravato 84 mg today as scheduled.  Tolerated it well without nausea or vomiting headache or chest pain or palpitations.  Expected dissociation gradually resolved over the 2 hour observation period. She feels 50% less depressed with Spravto and wants to continue it.   Continues meds Adderall XR 20 mg every morning, Wellbutrin XL 450 every morning, clonidine 0.1 mg twice daily, lorazepam 1 mg every 6 hours as needed. Has started nortriptyline 75 mg nightly for about 3 weeks and DT level 176, reduced to 50 mg HS early November. Still the same sx as noted last visit.  Tolerating meds.   Compliant.  Still depressed and family notices.  Has been able to participate in family interactions.  Some post-show let down and has to do detailed work which is hard for her bc ADD.  Sleep and eating well.  Energy OK but not great.  No SI.  Not cried in a year or so.  Clearly less depressed and hopeless than before the Spravato. Is not making  further progress generally.  Stuck with  moderate depression.  Is able to function pretty normally. Plan: trial pramipexole 0.25 mg BID off label for depression.   10/08/22 appt noted: Received Spravato 84 mg today as scheduled.  Tolerated it well without nausea or vomiting headache or chest pain or palpitations.  Expected dissociation gradually resolved over the 2 hour observation period. She feels 50% less depressed with Spravto and wants to continue it.   Continues meds Adderall XR 20 mg every morning, Wellbutrin XL 450 every morning, clonidine 0.1 mg twice daily, lorazepam 1 mg every 6 hours as needed. Has started nortriptyline 75 mg nightly for about 3 weeks and DT level 176, reduced to 50 mg HS early November. Still the same sx as noted last visit.  Tolerating meds.   Compliant.  Still depressed and family notices.  Has been able to participate in family interactions.  Some post-show let down and has to do detailed work which is hard for her bc ADD.  Sleep and eating well.  Energy OK but not great.  No SI.  Not cried in a year or so.  Clearly less depressed and hopeless than before the Spravato. Is not making further progress generally.  Stuck with moderate depression.  Behaved and felt pretty normally with family over for Thanksgiving. Doesn't see benefit or SE with pramipexole but thinks maybe it makes her worse. Plan:  increase pramipexole augmentation off label to 0.5 mg BID  10/15/2022 appointment noted: Received Spravato 84 mg today as scheduled.  Tolerated it well without nausea or vomiting headache or chest pain or palpitations.  Expected dissociation gradually resolved over the 2 hour observation period. She feels 50% less depressed with Spravto and wants to continue it.   Continues meds Adderall XR 20 mg every morning, Wellbutrin XL 450 every morning, clonidine 0.2 mg twice daily, lorazepam 1 mg every 6 hours as needed.  Trazodone 50 mg tablets 1-2 nightly as needed insomnia Has  started nortriptyline 75 mg nightly for about 3 weeks and DT level 176, reduced to 50 mg HS early November. Recommended increase pramipexole 0.5 mg twice daily off label for treatment resistant depression on 10/08/2022. She feels better motivated more active with pramipexole 0.5 mg twice daily.  She still is depressed but it is better.  We discussed the possibility of going up in the dose but did not change it.  10/22/2022 appointment noted: Received Spravato 84 mg today as scheduled.  Tolerated it well without nausea or vomiting headache or chest pain or palpitations.  Expected dissociation gradually resolved over the 2 hour observation period. She feels 50% less depressed with Spravto and wants to continue it.   Continues meds Adderall XR 20 mg every morning, Wellbutrin XL 450 every morning, clonidine 0.2 mg twice daily, lorazepam 1 mg every 8 hours as needed.  Trazodone 50 mg tablets 1-2 nightly as needed insomnia Has started nortriptyline 75 mg nightly for about 3 weeks and DT level 176, reduced to 50 mg HS early November. pramipexole 0.5 mg twice daily off label for treatment resistant depression on 10/08/2022. She is better motivated than she was.  She is journaling 3 pages a day.  She has started walking and has walked 5 days a week for 50 minutes for the last 2 weeks.  That is significantly helped her mood.  Her mood tends to be better when she interacts with family.  However she still has some periods of depression.  She is tolerating the medications well.  She still feels like her affect  and confidence is not back to normal. Plan:  increase pramipexole augmentation off label to 0.5 mg tID or 0.75 mg BID   10/29/22 appt noted: Received Spravato 84 mg today as scheduled.  Tolerated it well without nausea or vomiting headache or chest pain or palpitations.  Expected dissociation gradually resolved over the 2 hour observation period. She feels 50% less depressed with Spravto and wants to  continue it.   Continues meds Adderall XR 20 mg every morning, Wellbutrin XL 450 every morning, clonidine 0.2 mg twice daily, lorazepam 1 mg every 8 hours as needed.  Trazodone 50 mg tablets 1-2 nightly as needed insomnia Has started nortriptyline 75 mg nightly for about 3 weeks and DT level 176, reduced to 50 mg HS early November. pramipexole increased to 0.75 mg twice daily off label for treatment resistant depression  She has been feeling somewhat better with the increase in pramipexole and is tolerating it well.  She still is easily overwhelmed.  Her affect and mood can improve now when around her family or doing something positive.  She has been able to be more productive. She is tolerating the current medications. She is still considering TMS as an alternative to Spravato.  11/15/2022 appointment noted: Received Spravato 84 mg today as scheduled.  Tolerated it well without nausea or vomiting headache or chest pain or palpitations.  Expected dissociation gradually resolved over the 2 hour observation period. She feels 50% less depressed with Spravto and wants to continue it.   Continues meds Adderall XR 20 mg every morning, Wellbutrin XL 450 every morning, clonidine 0.2 mg twice daily, lorazepam 1 mg every 8 hours as needed.  Trazodone 50 mg tablets 1-2 nightly as needed insomnia Has started nortriptyline 75 mg nightly for about 3 weeks and DT level 176, reduced to 50 mg HS early November. pramipexole increased to 0.75 mg twice daily off label for treatment resistant depression  He has noticed some increase in depression due to the length of time since the last Spravato administration.  She believes Spravato is helping her.  sHe is not suicidal but has felt very blue the last few days. She is tolerating the medication. She is ambivalent about Spravato versus TMS but she is considering TMS. She wants to continue Spravato administration because it is clearly helpful.  11/19/22 appt noted: Received  Spravato 84 mg today as scheduled.  Tolerated it well without nausea or vomiting headache or chest pain or palpitations.  Expected dissociation gradually resolved over the 2 hour observation period. She feels 50% less depressed with Spravto and wants to continue it.   Continues meds Adderall XR 20 mg every morning, Wellbutrin XL 450 every morning, clonidine 0.2 mg twice daily, lorazepam 1 mg every 8 hours as needed.  Trazodone 50 mg tablets 1-2 nightly as needed insomnia Has started nortriptyline 75 mg nightly for about 3 weeks and DT level 176, reduced to 50 mg HS early November. pramipexole increased to 0.75 mg twice daily off label for treatment resistant depression  She has felt better back on Spravato more regularly.  However she still has residual depression esp when alone or when wihtout activity.  Can function when needed.   Does not have her connfidence back. She plans to pursue TMS availability. Have discussed retrying Auvelity  11/28/22 appt noted: Received Spravato 84 mg today as scheduled.  Tolerated it well without nausea or vomiting headache or chest pain or palpitations.  Expected dissociation gradually resolved over the 2 hour observation period.  She feels 50% less depressed with Spravto and wants to continue it.   Continues meds Adderall XR 20 mg every morning, Wellbutrin XL 450 every morning, clonidine 0.2 mg twice daily, lorazepam 1 mg every 8 hours as needed.  Trazodone 50 mg tablets 1-2 nightly as needed insomnia Has started nortriptyline 75 mg nightly for about 3 weeks and DT level 176, reduced to 50 mg HS early November. pramipexole increased to 0.75 mg twice daily off label for treatment resistant depression  Last week she was more depressed than usual for reasons that are not clear.  She has not had a clear answer from Filomena Jungling about [employer] options.  States that they have not returned her call.  She is asked about Auvelity retry in place of Wellbutrin.  Has still been  walking.  12/03/22 appt noted: Received Spravato 84 mg today as scheduled.  Tolerated it well without nausea or vomiting headache or chest pain or palpitations.  Expected dissociation gradually resolved over the 2 hour observation period. She feels 50% less depressed with Spravto and wants to continue it.   Continues meds Adderall XR 20 mg every morning, Wellbutrin XL 450 every morning, clonidine 0.2 mg twice daily, lorazepam 1 mg every 8 hours as needed.  Trazodone 50 mg tablets 1-2 nightly as needed insomnia started nortriptyline 75 mg nightly for about 3 weeks and DT level 176, reduced to 50 mg HS early November. pramipexole increased to 0.75 mg twice daily off label for treatment resistant depression  No SE .  Satisfied with meds. Depression is better in the last week.  Not sure why that is the case.  Still walking daily and that helps and journaling 3 pages daily.  Working on Biomedical scientist.  No changes desire.  Clearly benefits from Spravato but not 100%.  Still considering TMS.  12/10/22 appt noted: Received Spravato 84 mg today as scheduled.  Tolerated it well without nausea or vomiting headache or chest pain or palpitations.  Expected dissociation gradually resolved over the 2 hour observation period. She feels 50% less depressed with Spravto and wants to continue it.   Continues meds Adderall XR 20 mg every morning, Wellbutrin XL 450 every morning, clonidine 0.2 mg twice daily, lorazepam 1 mg every 8 hours as needed.  Trazodone 50 mg tablets 1-2 nightly as needed insomnia started nortriptyline 75 mg nightly for about 3 weeks and DT level 176, reduced to 50 mg HS early November. pramipexole increased to 0.75 mg twice daily off label for treatment resistant depression  No SE .   Depression was worse this week for no apparent reason.  She struggled being positive.  She has felt more discouraged.  She has felt more anxious.  She has had a hard time doing tasks.  She is interested in retrying the  [place] which we had discussed previously. Plan: She agrees to [employer].  To improve tolerability and reduce risk of side effects, Stop Wellbutrin and start Auvelity 1 in the morning for 1 week then 1 twice daily AndReduce pramipexole 0.5 mg BID and reduce nortriptyline to 50 mg HS  12/17/22 appt noted: Received Spravato 84 mg today as scheduled.  Tolerated it well without nausea or vomiting headache or chest pain or palpitations.  Expected dissociation gradually resolved over the 2 hour observation period. She feels 50% less depressed with Spravto and wants to continue it.   Continues meds Adderall XR 20 mg every morning, stopped Wellbutrin XL 150 every morning, clonidine 0.2 mg twice daily,  lorazepam 1 mg every 8 hours as needed.  Trazodone 50 mg tablets 1-2 nightly as needed insomnia Has started nortriptyline 75 mg nightly for about 3 weeks and DT level 176, reduced to 50 mg HS early November. pramipexole 0.375 mg twice daily off label for treatment resistant depression with plan to stop Started Auvelity 1 AM Still depressed to moderate degree.  Tolerating Auvelity so far.  No other problems with meds.  Willing to give Auvelity a chance. Plan: She agrees to [employer].  Continue 1 AM and when tolerated then increase to BID Stop pramipexole.  12/26/22 received Spravato  01/01/23 appt noted: Received Spravato 84 mg today as scheduled.  Tolerated it well without nausea or vomiting headache or chest pain or palpitations.  Expected dissociation gradually resolved over the 2 hour observation period. She feels 50% less depressed with Spravto and wants to continue it.   Continues meds Adderall XR 20 mg every morning, stopped Wellbutrin XL 150 every morning, clonidine 0.2 mg twice daily, lorazepam 1 mg every 8 hours as needed.  Trazodone 50 mg tablets 1-2 nightly as needed insomnia Has started nortriptyline 75 mg nightly for about 3 weeks and DT level 176, reduced to 50 mg HS early  November. Forgot to reduce pramipexole stoll taking  0.5 mg twice daily off label for treatment resistant depression  Started Auvelity 1 AM & PM last week. Occ misses Spravato DT htn.this week a better than last.   Painting again more and it helps mood.  Confidence still low and not as likely to socialize as normal but enjoys family.   Still ambivalent about TMS. Tolerating meds.  Including the increase in [place].    01/08/23 appt noted: Received Spravato 84 mg today as scheduled.  Tolerated it well without nausea or vomiting headache or chest pain or palpitations.  Expected dissociation gradually resolved over the 2 hour observation period. She feels 50% less depressed with Spravto and wants to continue it.   Continues meds Adderall XR 20 mg every morning, stopped Wellbutrin XL 150 every morning, clonidine 0.2 mg twice daily, lorazepam 1 mg every 8 hours as needed.  Trazodone 50 mg tablets 1-2 nightly as needed insomnia Has started nortriptyline 75 mg nightly for about 3 weeks and DT level 176, reduced to 50 mg HS early November. reduced pramipexole to 0.5 mg daily off label for treatment resistant depression  Started Auvelity 1 AM & PM mid Feb. More depressed as week progresses.  Thinks it is worse with less pramipexole.  More negative.  Able to function but feels miserable.  No SI.  Hopeless.  Sleep ok.  Tolerating meds.   Talked to [facility] about TMS and appt mid March. Plan: increase pramipexole to 0.5 mg TID for dep bc seemed to worsen with the reduction.  01/15/23 appt noted: Received Spravato 84 mg today as scheduled.  Tolerated it well without nausea or vomiting headache or chest pain or palpitations.  Expected dissociation gradually resolved over the 2 hour observation period. She feels 50% less depressed with Spravto and wants to continue it.   Continues meds Adderall XR 20 mg every morning, stopped Wellbutrin XL 150 every morning, clonidine 0.2 mg twice daily, lorazepam 1 mg every 8  hours as needed.  Trazodone 50 mg tablets 1-2 nightly as needed insomnia Has started nortriptyline 75 mg nightly for about 3 weeks and DT level 176, reduced to 50 mg HS early November. Increased pramipexole to 0.5 mg  to TID  off label for  treatment resistant depression  Started Auvelity 1 AM & PM mid Feb. markedly better depression the increase in pramipexole.  She feels like her depression is almost resolved.  She is very pleased with the response.  She is tolerating the medications well  01/30/23 appt noted: Received Spravato 84 mg today as scheduled.  Tolerated it well without nausea or vomiting headache or chest pain or palpitations.  Expected dissociation gradually resolved over the 2 hour observation period. She feels 50% less depressed with Spravto and wants to continue it.   Continues meds Adderall XR 20 mg every morning, stopped Wellbutrin XL 150 every morning, clonidine 0.2 mg twice daily, lorazepam 1 mg every 8 hours as needed.  Trazodone 50 mg tablets 1-2 nightly as needed insomnia Has started nortriptyline 75 mg nightly for about 3 weeks and DT level 176, reduced to 50 mg HS early November. Increased pramipexole to 0.5 mg  to TID  off label for treatment resistant depression  Started Auvelity 1 AM & PM mid Feb. Mood markedly better with pramipexole added.  Nearly normal mood now with minimal depression.  More social and outgoing and motivated and resolved anhedonia. No SE.  Compliant.  02/04/23 appt noted: Continues meds Adderall XR 20 mg every morning, stopped Wellbutrin XL 150 every morning, clonidine 0.2 mg twice daily, lorazepam 1 mg every 8 hours as needed.  Trazodone 50 mg tablets 1-2 nightly as needed insomnia Has started nortriptyline 75 mg nightly for about 3 weeks and DT level 176, reduced to 50 mg HS early November. Increased pramipexole to 0.5 mg  to TID  off label for treatment resistant depression  Started Auvelity 1 AM & PM mid Feb. Received Spravato 84 mg today as  scheduled.  Tolerated it well without nausea or vomiting headache or chest pain or palpitations.  Expected dissociation gradually resolved over the 2 hour observation period. She feels 50% less depressed with Spravto and wants to continue it.  The pramipexole got her the rest of the way better.  Doesn't want to cut back on any tx bc afraid of relapse. Tolerating meds.  02/15/23 appt noted: Continues meds Adderall XR 20 mg every morning, stopped Wellbutrin XL 150 every morning, clonidine 0.2 mg twice daily, lorazepam 1 mg every 8 hours as needed.  Trazodone 50 mg tablets 1-2 nightly as needed insomnia Has started nortriptyline 75 mg nightly for about 3 weeks and DT level 176, reduced to 50 mg HS early November. Increased pramipexole to 0.5 mg  to TID  off label for treatment resistant depression  Started Auvelity 1 AM & PM mid Feb. Received Spravato 84 mg today as scheduled.  Tolerated it well without nausea or vomiting headache or chest pain or palpitations.  Expected dissociation gradually resolved over the 2 hour observation period. She feels no longer depressed.   The pramipexole got her the rest of the way better.  Doesn't want to cut back on any tx bc afraid of relapse. Tolerating meds.  02/18/23 appt noted: Continues meds Adderall XR 20 mg every morning, stopped Wellbutrin XL 150 every morning, clonidine 0.2 mg twice daily, lorazepam 1 mg every 8 hours as needed.  Trazodone 50 mg tablets 1-2 nightly as needed insomnia Has started nortriptyline 75 mg nightly for about 3 weeks and DT level 176, reduced to 50 mg HS early November. Increased pramipexole to 0.5 mg  to TID  off label for treatment resistant depression  Started Auvelity 1 AM & PM mid Feb. Received Spravato 84 mg  today as scheduled.  Tolerated it well without nausea or vomiting headache or chest pain or palpitations.  Expected dissociation gradually resolved over the 2 hour observation period. She feels no longer depressed except when  ran out of Auvelity this week DT lack of availability.  Much worse without it..   The pramipexole got her the rest of the way better.  Doesn't want to cut back on any tx bc afraid of relapse. Tolerating meds.  02/25/23 appt: Received Spravato 84 mg today as scheduled.  Tolerated it well without nausea or vomiting headache or chest pain or palpitations.  Expected dissociation gradually resolved over the 2 hour observation period. Continues meds Adderall XR 20 mg every morning, stopped Wellbutrin XL 150 every morning, clonidine 0.2 mg twice daily, lorazepam 1 mg every 8 hours as needed.  Trazodone 50 mg tablets 1-2 nightly as needed insomnia Has started nortriptyline 75 mg nightly for about 3 weeks and DT level 176, reduced to 50 mg HS early November. Increased pramipexole to 0.5 mg  to TID  off label for treatment resistant depression  Started Auvelity 1 AM & PM mid Feb. No SE Still dramatically better with meds and Spravato.  Not 100% over depression. Had GS born last week and able to enjoy it now.  Is a little sleepy with pramipexole but manageable.  No change desired.  Sleep is ok.  03/04/23 appt noted: Received Spravato 84 mg today as scheduled.  Tolerated it well without nausea or vomiting headache or chest pain or palpitations.  Expected dissociation gradually resolved over the 2 hour observation period. Continues meds Adderall XR 20 mg every morning, stopped Wellbutrin XL 150 every morning, clonidine 0.2 mg twice daily, lorazepam 1 mg every 8 hours as needed.  Trazodone 50 mg tablets 1-2 nightly as needed insomnia nortriptyline reduced to 50 mg HS early November. Increased pramipexole to 0.5 mg  to TID  off label for treatment resistant depression  Started Auvelity 1 AM & PM mid Feb. Still generally doing well with meds and Spravato except when got tooth abscess.  Freels more depressed after dental surgery.   No SE with meds.   Sleep is OK.  No med changes desired  03/11/23 appt  noted: Received Spravato 84 mg today as scheduled.  Tolerated it well without nausea or vomiting headache or chest pain or palpitations.  Expected dissociation gradually resolved over the 2 hour observation period. Continues meds Adderall XR 20 mg every morning, stopped Wellbutrin XL 150 every morning, clonidine 0.2 mg twice daily, lorazepam 1 mg every 8 hours as needed.  Trazodone 50 mg tablets 1-2 nightly as needed insomnia nortriptyline reduced to 50 mg HS early November. Increased pramipexole to 0.5 mg  to TID  off label for treatment resistant depression  Started Auvelity 1 AM & PM mid Feb. Some increase depression with infection and needing amoxiciliin this week.  Otherwie still doing well with mood and meds. No med changes desired or indicated. No SE  03/21/2023 appointment noted: Continues meds Adderall XR 20 mg every morning, stopped Wellbutrin XL 150 every morning, clonidine 0.2 mg twice daily, lorazepam 1 mg every 8 hours as needed.  Trazodone 50 mg tablets 1-2 nightly as needed insomnia nortriptyline reduced to 50 mg HS early November. Increased pramipexole to 0.5 mg  to TID  off label for treatment resistant depression  Started Auvelity 1 AM & PM mid Feb. Received Spravato 84 mg today as scheduled.  Tolerated it well without nausea or vomiting headache  or chest pain or palpitations.  Expected dissociation gradually resolved over the 2 hour observation period. Depression is much better.  Pretty much resolved this week.  She feels the Auvelity and pramipexole have made the biggest difference.  Obviously the Spravato is also helping significantly.  She wants to continue all of these medications.  She still has anxiety easily.  Especially facing something that she would rather avoid.  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 Tolerating meds without significant side effects. Plan no med changes  03/29/23 appt  noted: Continues meds Adderall XR 20 mg every morning, stopped Wellbutrin XL 150 every morning, clonidine 0.2 mg twice daily, lorazepam 1 mg every 8 hours as needed.  Trazodone 50 mg tablets 1-2 nightly as needed insomnia, nortriptyline 50 mg HS,  pramipexole 0.5 mg  to TID  off label for treatment resistant depression  Started Auvelity 1 AM & PM mid Feb 2024 No SE Received Spravato 84 mg today as scheduled.  Tolerated it well without nausea or vomiting headache or chest pain or palpitations.  Expected dissociation gradually resolved over the 2 hour observation period. Depression continues to improve to a point of very mild sx.  Still hasn't gotten her confidence fully back.  She does enjoy social interactions and is productive at work. She is tolerating the meds well without side effects.  We discussed the possibility of reducing some of the medication given the marked response which was positive with pramipexole.  04/04/23 appt noted: Continues meds Adderall XR 20 mg every morning, stopped Wellbutrin XL 150 every morning, clonidine 0.2 mg twice daily, lorazepam 1 mg every 8 hours as needed.  Trazodone 50 mg tablets 1-2 nightly as needed insomnia, nortriptyline 50 mg HS,  pramipexole 0.5 mg  to TID  off label for treatment resistant depression  Started Auvelity 1 AM & PM mid Feb 2024 No SE Received Spravato 84 mg today as scheduled.  Tolerated it well without nausea or vomiting headache or chest pain or palpitations.  Expected dissociation gradually resolved over the 2 hour observation period. Dep is still much improved with increase in pramipexole.  Largely resolved.  Still gets anxious but takes less lorazepam prn but still needs it.  Sleep ok.  Tolerating meds.  04/10/23 appt noted: Continues meds Adderall XR 20 mg every morning, clonidine 0.2 mg twice daily, lorazepam 1 mg every 8 hours as needed.  Trazodone 50 mg tablets 1-2 nightly as needed insomnia, nortriptyline 50 mg HS,  pramipexole 0.5 mg   to TID  off label for treatment resistant depression  Started Auvelity 1 AM & PM mid Feb 2024 No SE.  Satisfied with meds.   Received Spravato 84 mg today as scheduled.  Tolerated it well without nausea or vomiting headache or chest pain or palpitations.  Expected dissociation gradually resolved over the 2 hour observation period. Dep is still much improved with increase in pramipexole.  Largely resolved.  Still gets anxious but takes less lorazepam prn but still needs it.  Sleep ok.  Tolerating meds. Did an art show in another town; I couldn't have done this a year ago.    05/07/23 appt noted: Continues meds Adderall XR 20 mg every morning, clonidine 0.2 mg twice daily, lorazepam 1 mg every 8 hours as needed.  Trazodone 50 mg tablets 1-2 nightly as needed insomnia, nortriptyline 25-50 mg HS,  pramipexole 0.5 mg  to TID  off label for treatment resistant depression  Started Auvelity 1 AM & PM mid Feb 2024 No SE.  Satisfied with meds.   Received Spravato 84 mg today as scheduled.  Tolerated it well without nausea or vomiting headache or chest pain or palpitations.  Expected dissociation gradually resolved over the 2 hour observation period. H says she is her old self.  Dep so much better.  Painting going well.  Anxiety in AM and then better.  Wakes with it.  Structured days more.  Negative self talk gone.  Wonderful to have relief.  Good weekend at a wedding.  Enjoys GS; [place].  More outspoken since the dep but not over the top.   More interest overall and satisfied wth resp.  Agrees to wean nortriptyline but doesn't want other med changes.  05/15/23 received Spravato 84  05/27/23 appt noted: Continues meds Adderall XR 20 mg every morning, clonidine 0.2 mg twice daily, lorazepam 1 mg every 8 hours as needed.  Trazodone 50 mg tablets 1-2 nightly as needed insomnia, nortriptyline 25-50 mg HS,  pramipexole 0.5 mg  to TID  off label for treatment resistant depression  Started Auvelity 1 AM & PM mid Feb  2024 No SE.  Satisfied with meds.   Received Spravato 84 mg today as scheduled.  Tolerated it well without nausea or vomiting headache or chest pain or palpitations.  Expected dissociation gradually resolved over the 2 hour observation period. Maybe a little more anxious re: weaning off nortriptyline.  Busy summer.  Extremely prolific painting.   Has a pending show and stressed over preparing.   Enjoys [employer].  Kept him yesterday for a couple of hours.   Dep has been like a cloak with physical sensation for a long time but is much better than it was.  Still not 100% all the time.  Struggles with some chronic guilt issues.  Self talk and CBT on herself yesterday had some. Still some fear about travelling and demands but so much better.   Making people laugh again and socially engaged and H agrees so much better. M has been impossible.  WC and immobile.  She wastes money.   Plan no med changes  06/03/23 appt noted: Meds as above.  No SE.  Satisfied with meds.   Received Spravato 84 mg today as scheduled.  Tolerated it well without nausea or vomiting headache or chest pain or palpitations.  Expected dissociation gradually resolved over the 2 hour observation period. Overall she feels better with Spravato.  She is minimally depressed generally when she is alone and not active but she is staying active socially and with her painting.  This is really helped with her depression and she is having more success.  She still has residual anxiety which she feels she needs to take lorazepam during the day.  She is attempting to minimize it but without much success.  She wants to continue the Spravato because of the dramatic improvement she is seen with this and the medications.  06/11/23 appt noted: Meds as above.  No SE.  Satisfied with meds.   Received Spravato 84 mg today as scheduled.  Tolerated it well without nausea or vomiting headache or chest pain or palpitations.  Expected dissociation gradually  resolved over the 2 hour observation period. Still has some anxiety wihtout reason an benefit lorazepam and usually only 1 mg daily.  Makes her sleepy if takes it in the day. Sees sig benefit with pramipexole 0.5 mg TID and consistent  with it. Mood is still good overall.  But bc mother still can worry about getting older.  Feels too scared about it but working on it. No concerns with meds.     ECT-MADRS    Flowsheet Row Clinical Support from 01/08/2023 in [facility] Clinical Support from 08/06/2022 in [facility] Clinical Support from 07/04/2022 in [facility] Clinical Support from 05/21/2022 in [facility] Office Visit from 03/02/2022 in [facility]  MADRS Total Score 21 29 15 27  46      Past Psychiatric Medication Trials: fluoxetine, duloxetine, Viibryd, Pristiq, sertraline, citalopram,  Trintellix anxious and SI Wellbutrin XL 450 Auvelity 1 dose nortriptyline 75 mg nightly for about 3 weeks and DT level 176, reduced to 50 mg HS NR. Pramipexole 1 mg NR Adderall, Adderall XR, Vyvanse, Ritalin, Strattera low dose NR Lorazepam Trazodone  Depakote,  lamotrigine cog complaints Lithium remotely Abilify 7.5  Vraylar 1.5 mg daily agitation and insomnia Rexulti insomnia Latuda 40 one dose, CO anxious and SI Seroquel XR 300 Olanzapine 10  At visit November 12, 2019. We discussed Patient developed an increasingly severe alcohol dependence problem since her last visit in January.  She went to [facility] and has had no alcohol since then except 1 day.  She never abused stimulants but they took her off the stimulants at [facility].  Her ADD was markedly worse.  The Wellbutrin did not help the ADD.   D history lamotrigine rash at 67 yo  Review of Systems:  Review of Systems  Constitutional:  Negative for fatigue.  Musculoskeletal:  Positive  for back pain. Negative for arthralgias and joint swelling.       SP hip surgery October 2020  Neurological:  Negative for tremors.  Psychiatric/Behavioral:  Negative for agitation, behavioral problems, confusion, decreased concentration, dysphoric mood, hallucinations, self-injury, sleep disturbance and suicidal ideas. The patient is nervous/anxious. The patient is not hyperactive.        Forgetful at times about med recommendations.    Medications: I have reviewed the patient's current medications.  Current Outpatient Medications  Medication Sig Dispense Refill   amLODipine (NORVASC) 2.5 MG tablet Take 2.5 mg by mouth daily.     amphetamine-dextroamphetamine (ADDERALL XR) 20 MG 24 hr capsule Take 1 capsule (20 mg total) by mouth every morning. 30 capsule 0   amphetamine-dextroamphetamine (ADDERALL XR) 20 MG 24 hr capsule Take 1 capsule (20 mg total) by mouth every morning. 30 capsule 0   cloNIDine (CATAPRES) 0.2 MG tablet TAKE 1 TABLET BY MOUTH TWICE A DAY 180 tablet 0   Esketamine HCl, 84 MG Dose, (SPRAVATO, 84 MG DOSE,) 28 MG/DEVICE SOPK USE 3 SPRAYS IN EACH NOSTRIL ONCE A WEEK 3 each 2   iron polysaccharides (NIFEREX) 150 MG capsule TAKE 1 CAPSULE BY MOUTH EVERY DAY 90 capsule 1   losartan (COZAAR) 50 MG tablet Take 50 mg by mouth daily.     nebivolol (BYSTOLIC) 2.5 MG tablet Take 2.5 mg by mouth daily.     Dextromethorphan-buPROPion ER (AUVELITY) 45-105 MG TBCR Take 1 tablet by mouth 2 (two) times daily. 60 tablet 3   LORazepam (ATIVAN) 1 MG tablet Take 1 tablet (1 mg total) by mouth every 8 (eight) hours as needed. for anxiety 90 tablet 1   pramipexole (MIRAPEX) 0.5 MG tablet Take 1 tablet (0.5 mg total) by mouth 3 (three) times daily. 270 tablet 1   traZODone (DESYREL)  50 MG tablet TAKE 1-2 TABLETS BY MOUTH NIGHTLY AS NEEDED FOR SLEEP 180 tablet 1   No current facility-administered medications for this visit.    Medication Side Effects: None  Allergies:  Allergies  Allergen  Reactions   Metronidazole Shortness Of Breath and Other (See Comments)    Heart pounding   Ferrlecit [Na Ferric Gluc Cplx In Sucrose] Other (See Comments)    Infusion reaction 05/12/2019    Past Medical History:  Diagnosis Date   ADHD    Anemia    Anxiety    Arthritis    Depression    Heart murmur    i went to see a cardiologit slast eyar  and i had zero plaque,    PONV (postoperative nausea and vomiting)    Recovering alcoholic in remission (HCC)     Family History  Problem Relation Age of Onset   Atrial fibrillation Mother    CAD Father     Past Medical History, Surgical history, Social history, and Family history were reviewed and updated as appropriate.   Please see review of systems for further details on the patient's review from today.   Objective:   Physical Exam:  There were no vitals taken for this visit.  Physical Exam Constitutional:      General: She is not in acute distress. Neurological:     Mental Status: She is alert and oriented to person, place, and time.     Coordination: Coordination normal.     Gait: Gait normal.  Psychiatric:        Attention and Perception: Attention and perception normal.        Mood and Affect: Mood is anxious. Mood is not depressed.        Speech: Speech is not rapid and pressured or tangential.        Behavior: Behavior is not slowed or hyperactive.        Thought Content: Thought content is not paranoid or delusional. Thought content does not include homicidal or suicidal ideation. Thought content does not include suicidal plan.        Cognition and Memory: Cognition normal. Memory is not impaired.        Judgment: Judgment normal.     Comments: Insight intact. No auditory or visual hallucinations. No delusions.  Depression minimal with pramipexole TID and Auvelity generally.  No manic sx     Lab Review:     Component Value Date/Time   NA 137 01/12/2021 1430   NA 140 11/18/2018 1544   K 3.8 01/12/2021 1430    CL 108 01/12/2021 1430   CO2 22 01/12/2021 1430   GLUCOSE 94 01/12/2021 1430   BUN 14 01/12/2021 1430   BUN 20 11/18/2018 1544   CREATININE 0.82 01/12/2021 1430   CALCIUM 8.9 01/12/2021 1430   PROT 6.6 01/12/2021 1430   ALBUMIN 3.9 01/12/2021 1430   AST 12 (L) 01/12/2021 1430   ALT 11 01/12/2021 1430   ALKPHOS 46 01/12/2021 1430   BILITOT 0.5 01/12/2021 1430   GFRNONAA >60 01/12/2021 1430   GFRAA >60 09/02/2019 0249   GFRAA >60 01/27/2019 0811       Component Value Date/Time   WBC 4.5 01/12/2021 1430   RBC 4.32 01/12/2021 1430   HGB 12.8 01/12/2021 1430   HGB 12.9 07/17/2019 0953   HCT 38.5 01/12/2021 1430   HCT 21.9 (L) 12/25/2018 1221   PLT 272 01/12/2021 1430   PLT 286 07/17/2019 0953   MCV 89.1 01/12/2021  1430   MCH 29.6 01/12/2021 1430   MCHC 33.2 01/12/2021 1430   RDW 12.4 01/12/2021 1430   LYMPHSABS 1.4 01/12/2021 1430   MONOABS 0.4 01/12/2021 1430   EOSABS 0.0 01/12/2021 1430   BASOSABS 0.0 01/12/2021 1430    No results found for: "POCLITH", "LITHIUM"   No results found for: "PHENYTOIN", "PHENOBARB", "VALPROATE", "CBMZ"   .res Assessment: Plan:    Recurrent major depression resistant to treatment (HCC) - Plan: Dextromethorphan-buPROPion ER (AUVELITY) 45-105 MG TBCR, pramipexole (MIRAPEX) 0.5 MG tablet  Generalized anxiety disorder - Plan: LORazepam (ATIVAN) 1 MG tablet  Attention deficit hyperactivity disorder (ADHD), predominantly inattentive type  Insomnia due to mental condition - Plan: LORazepam (ATIVAN) 1 MG tablet, traZODone (DESYREL) 50 MG tablet  Accelerated hypertension   She has treatment resistant major depression ongoing with 90% better with Spravato. Have  discussed some of her  abnormal behaviors last year leading to this depressive episode getting worse which she says were associated with heavy use of delta 8 and not a manic episode.  She realizes now that that was not good for her.  She stopped all use of other drugs including those  available over-the-counter such as delta 8 or any other THC related products.  She is no longer having any of those types of behaviors and instead is depressed.  She is experiencing more pleasure and is less blunted and enjoying more and better inteest versus before the Spravato.  She also feels worse if misses a dose of Spravato.  Depression nearly resolved with pramipexole TID and relapsed when out of Auvelity.  Depression resolved recently after increasing pramipexole 0.5 mg TID, brief relapse when out of Auvelity Consider switch to ER to reduce sleepiness  Patient was administered Spravato 84 mg intranasally today.  The patient experienced the typical dissociation which gradually resolved over the 2-hour period of observation.  There were no complications.  Specifically the patient did not have nausea or vomiting or headache.  Blood pressures monitored at the 40-minute and 2-hour follow-up intervals.  Borderline high.  By the time the 2-hour observation period was met the patient was alert and oriented and able to exit without assistance.  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  See nursing note for further details.She wants to continue Spravato. We discussed discussed the side effects in detail as well as the protocol required to receive Spravato.    has failed all major categories of antidepressants except TCAs and MAO inhibitors which have not been tried until now.  She is tolerating Auvelity.  Continue 1  BID bc more depressed without it dramatically.  Relapsed witout it.  Worse with less pramipexole. Increased to 0.5 mg TID on about end of Feb 2024 and markedly better Dosing range off label for depression ranges from 1-5 mg daily.  Disc risk compulsive impulsive behavior and mania.    Started Spravato 84 mg twice weekly on 03/16/2022.  Wants to continue weekly and feels she needs it.   Disc risk  relapse if less than weekly and she doesn't seem to do well with less than weekily.  Adderall  XR 20 mg AM   Ativan 1 mg 3 times daily as needed anxiety but try to cut it back.  She still feels she needs it. Is not ideal to use benzodiazepine with stimulant but because of the severity of her symptoms it has been necessary.  Hope to eventually eliminate  the benzodiazepine.  Encourage her to embrace this goal.    Continue clonidine 0.2 mg BID off label for anxiety and helps BP partially. BP is better controlled and more consistent.Rec keep track of BP and discuss with PCP. It has been up and down.  Sometimes needs extra clonidine before Spravato.  Better lately.  Discussed potential benefits, risks, and side effects of stimulants with patient to include increased heart rate, palpitations, insomnia, increased anxiety, increased irritability, or decreased appetite.  Instructed patient to contact office if experiencing any significant tolerability issues. She wants to return to usual dose of Adderall for ADD bc of mor poor cognitive function with reduction.  Also discussed that depression will impair cognitive function.  Disc risk polypharmacy.  Relapsed after running out of Auvelity so needs clearly it and pramipexole.   She continues walkly daily with benefit.  Has Maintained sobriety.    No med changes indicated today: Continue Adderall XR 20 mg every morning Continue Auvelity 1 tablet twice daily,  continue clonidine 0.2 mg twice daily for anxiety and blood pressure. Continue lorazepam 1 mg 3 times daily she has been unable to cut back on the dose due to anxiety. Off  nortriptyline 50 mg nightly Continue pramipexole 0.5 mg 3 times daily Continue trazodone 50 mg tablets 1-2 nightly as needed for sleep.  FU with Spravato weekly   Meredith Staggers, MD, DFAPA     Please see After Visit Summary for patient specific instructions.  Future Appointments  Date Time Provider Department Center   06/18/2023  9:30 AM Cottle, Steva Ready., MD [facility] None  06/18/2023  9:30 AM CP-NURSE [facility] None                      No orders of the defined types were placed in this encounter.      -------------------------------

## 2023-06-11 NOTE — Progress Notes (Signed)
NURSES NOTE:   Patient arrived for her weekly 84mg  Spravato treatment. Pt is being treated for Treatment Resistant Depression this is #70 treatment, pt will be receiving 84 mg which will continue to be her maintenance dose, she receives weekly treatments.  Patient arrived and taken to treatment room. Confirmed she had a ride home which is her husband would be coming back to pick up pt when done and sometimes she needs to use Bened[facility] Goad if he is unable to pick her up. Pt's Spravato is ordered through [employer] and delivered to office, all Spravato medication is stored at doctors office per REMS/FDA guidelines. The medication is required to be locked behind two doors per FDA/REMS Protocol. Medication is also disposed of properly per regulations. Pt's prior authorization for Spravato 84 mg was renewed with an approval through 07/17/2023 with [facility]. All treatments with vital signs documented with Spravato REMS per protocol of being a treatment center.    Pt reports feeling very good, she is getting out a lot more and enjoying things. Her and her family went to the beach and she showed me pictures. Pt is doing extremely well. Very talkative today. Began taking patient's vital signs at 10:00 AM 116/77, pulse 62. Pulse Ox 99%. Gave patient first dose 28 mg nasal spray, each nasal spray administered in each nostril as directed and waited 5 minutes between the second and third dose. All 3 doses given pt did not complain of any nausea/vomiting, given a cup of water due to the taste after the administration of Spravato.  She listens to [employer] with spa or relaxing music. Pt is not sedate today, and her vital signs are stable. Checked 40 minute vitals at 10:45 AM, 110/74, pulse 57, Pulse Ox 92%. Explained she would be monitored for a total time of 120 minutes. Discharge vitals were taken at 12:00 PM 116/69 P 51, 99% Pulse Ox. Dr. Jennelle Human met with pt and discussed her treatment. Recommend she go home and sleep or just  relax on the couch. No driving, no intense activities. Verbalized understanding. Nurse was with pt a total of 70 minutes for clinical. Pt will be scheduled, next Tuesday, August 6th.. Pt instructed to call office with any problems or questions.      LOT [id number] EXP APR 2027

## 2023-06-18 ENCOUNTER — Encounter: Payer: 59 | Admitting: Psychiatry

## 2023-06-20 ENCOUNTER — Ambulatory Visit: Payer: 59 | Admitting: Psychiatry

## 2023-06-20 ENCOUNTER — Ambulatory Visit: Payer: 59

## 2023-06-20 VITALS — BP 108/82 | HR 57

## 2023-06-20 DIAGNOSIS — F411 Generalized anxiety disorder: Secondary | ICD-10-CM

## 2023-06-20 DIAGNOSIS — F900 Attention-deficit hyperactivity disorder, predominantly inattentive type: Secondary | ICD-10-CM

## 2023-06-20 DIAGNOSIS — F339 Major depressive disorder, recurrent, unspecified: Secondary | ICD-10-CM

## 2023-06-20 DIAGNOSIS — F5105 Insomnia due to other mental disorder: Secondary | ICD-10-CM

## 2023-06-20 DIAGNOSIS — I10 Essential (primary) hypertension: Secondary | ICD-10-CM

## 2023-06-20 NOTE — Progress Notes (Signed)
NURSES NOTE:   Patient arrived for her weekly 84mg  Spravato treatment. Pt is being treated for Treatment Resistant Depression this is #70 treatment, pt will be receiving 84 mg which will continue to be her maintenance dose, she receives weekly treatments.  Patient arrived and taken to treatment room. Confirmed she had a ride home which is her husband would be coming back to pick up pt when done and sometimes she needs to use [facility] Goad if he is unable to pick her up. Pt's Spravato is ordered through [employer] and delivered to office, all Spravato medication is stored at doctors office per REMS/FDA guidelines. The medication is required to be locked behind two doors per FDA/REMS Protocol. Medication is also disposed of properly per regulations. Pt's prior authorization for Spravato 84 mg was renewed with an approval through 07/17/2023 with [facility]. All treatments with vital signs documented with Spravato REMS per protocol of being a treatment center.    Pt reports her Mom has declined and will have to be placed in a community to help her. Pt remains immobile and therefore very hard to transfer. She is sad about that situation but is expected, she did discuss this for a bit with me. Began taking patient's vital signs at 10:00 AM 109/83, pulse 53. Pulse Ox 99%. Gave patient first dose 28 mg nasal spray, each nasal spray administered in each nostril as directed and waited 5 minutes between the second and third dose. All 3 doses given pt did not complain of any nausea/vomiting, given a cup of water due to the taste after the administration of Spravato.  She listens to [employer] with spa or relaxing music. Pt is not sedate today, and her vital signs are stable. Checked 40 minute vitals at 10:45 AM, 109/75, pulse 60, Pulse Ox 92%. Explained she would be monitored for a total time of 120 minutes. Discharge vitals were taken at 12:05 PM 108/82 P 51, 99% Pulse Ox. Dr. Jennelle Human met with pt and discussed her treatment.  Recommend she go home and sleep or just relax on the couch. No driving, no intense activities. Verbalized understanding. Nurse was with pt a total of 70 minutes for clinical. Pt will be scheduled, next Tuesday, August 13th, unless something changes with her Mother. Pt instructed to call office with any problems or questions.      LOT [id number] EXP APR 2027

## 2023-06-20 NOTE — Progress Notes (Signed)
Laura Chang [id number] [birth year] 67 y.o.  Subjective:   Patient ID:  Laura Chang is a 67 y.o. (DOB 22-Mar-1956) female.  Chief Complaint:  Chief Complaint  Patient presents with   Follow-up   Depression   Anxiety   ADD   Sleeping Problem      [name] presents to the office today for follow-up of depression and anxiety and ADD.  seen November 12, 2019.  Melted down in 2020.  Went to [facility] in July.  No withdrawal.  1 drink since then.  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 She was on no stimulant and no SSRI but was taking Strattera and Wellbutrin.  The following changes were made. Stop Strattera. OK restart stimulant bc severe ADD Restart Adderall 1 daily for a few days and if tolerated then restart 1 twice daily. If not tolerated reduce the dosage if needed. May need to stop Wellbutrin if not tolerating the stimulant.  Yes.  DC Wellbutrin Restart Prozac 20 mg daily.  February 2021 appointment with the following noted: Completed grant proposal.  Couldn't doit without Adderall.  Sold a bunch of work.   Adderall XR lasts about 3 pm.  Strength seems about right.  BP been OK.  Not jittery.   Stopped Wellbutrin but had no SE. Mood drastically better with grant proposal and back on fluoxetine.  Less depressed and lethargic.  No anxiety.  Cut back on coffee. Started back with devotions and stronger faith. Plan: Continue Prozac 20 mg daily. May have to increase the dose at some point in the future given that she usually was taking higher dosages but she is getting good response at this time. Restart Wellbutrin off label for ADD since can't get 2 ADDERALL daily. 150 mg daily then 300 mg daily. She can adjust the dose between 150 mg and 300 mg daily to get the optimal effect.   05/11/2020 appointment with the following noted: Has been inconsistent with Prozac and Wellbutrin. Not sure of the effect of Wellbutrin. Biggest deterrent in  work is anxiety.  Some of the work is conceptual and difficult at times.  Can feel she's not up to a project at times.  Overall is OK but would like a steadier benefit from stimulant.  Exhausted from managing concentration and keeping up with things from the day.  Loses things.  Not good keeping up with schedule. Overall productive and emotionally OK. Can feel Adderall wear off. Mood is better in summer and worse in the winter.   F died in 10/20/23 and that is a loss. No SE Wellbutrin. Still attends AA meetings.  Real benefit from [facility] last year. Recognizes effect of anemia on ADD and mood.  Had iron infusions last winter. Plan:  Wellbutrin off label for ADD since can't get 2 ADDERALL daily. 150 mg daily then 300 mg daily.  01/24/2021 appointment with following noted: Doing a program called [employer] mindfulness app since Xmas.  CBT app helped the depression.  App helped her focus better.  Lost sign weight. Writing a lot. Before Xmas felt depressed and started negative thinking worse, self denigrating. Not drinking. More isolated.   Recognizes mo is narcissist.    Didn't tell anyone she was born until 3 mos later.  M aloof and uninterested in pt.  Lied about her birthday.  Mo lack of affection even with pt's kids. Going to AA for a year and it helped her to quit drinking. Also  misses kids being gone with a hole also. Plan: No med changes  05/04/2021 appointment with the following noted: Therapist [name] thinks she's manic. Lost weight to 144#.   States she is still sleeping okay.  Admits she is hyper and recognizes that she is likely manic.  She feels great, euphoric with an increased sense of spiritual connectedness to God.  She has racing thoughts and talks fast and talks a lot and this is noted by her husband.  He thinks she is a bit hyper.  She has been able to maintain sobriety although she will have 1 glass of wine on special occasions but does not drink by herself.  She  is not drinking to excess.  She denies any dangerous impulsivity.  She is clearly not depressed and not particularly anxious.  She has no concerns about her medication and she has been compliant.  06/16/21 appt noted: So much better.  Going through a lot but the manic thing happened on top of it.  So much slower.  Didn't feel like losing anything with risperidone.  Likes the Adderalll at 10 mg. Some drowsiness in the AM and very drowsy from risperidone 2 mg HS. Prayer life is better. Handling stress better. Less depressed with risperidone. Still likes trazodone. Sleeps well. Plan: Reduce Prozac to 10 mg daily.  Consider stopping it because it can feel the mania however she is reluctant to do that because she fears relapse of depression. Reduce risperidone to 1.5 mg nightly due to side effects.  Discussed risk of worsening mania.  07/25/2021 appointment with the following noted: Misses the Adderall and hard to function without it. Depressed now. Heavy chest.  Anxious and guilty.  Body feels heavy.   Hates Wellbutrin.   Plan: Increase fluoxetine to 20 mg daily Add Abilify 1/2 of 15 mg tablet daily Wean wellbutrin by 1 tablet each week  bc she feels it is not helpful and DT polypharmacy Reduce risperidone to 1 daily for 1 week and stop it. Disc risk of mania. Increase Adderall to XR 20 mg AM  08/08/21 Much less depressed and starting to feel normal I feel a lot better. No SE.  Speech normal off risperidone. Sleeping OK on trazaodone and enough.   Noticed benefit from Adderall again. Plan: continue fluoxetine to 20 mg daily Continue Abilify 1/2 of 15 mg tablet daily for depression and mania continue Adderall to XR 20 mg AM  10/10/2021 phone call: Pt stated she feels like the Abilify should be decreased to 5mg .She said she is depressed but rational and not suicidal.She has an appt Monday and can wait until then if you prefer. MD response: Reduce the Abilify to 7.5 mg every other day.  We will  meet on 10/16/2021 and decide what to do from there.  10/16/2021 appointment with the following noted: More depressed.  Most depressed I've ever been.  Just numb.  Sense of grief.   Thinks the manic episode was unlike anything else she ever had.  Doesn't want to medicate against it.  Don't enjoy people.  Easily overwhelmed.  Had some death thoughts but not suicidal.  Has been functional.  Feels better today after reducing Abilify to every other day but she is only been doing that for 3 days. A/P: Episode of post manic depression was explained. continue fluoxetine to 20 mg daily Hold Abilify for 1 week then resume Abilify 1/2 of 15 mg tablet every other day for depression and mania continue Adderall to XR 20  mg AM  10/27/2021 appointment with the following noted: I'm doing so much better.  Handling the depression better. Better self talk and spiritual focus has helped.   Dep 6/10 manifesting as anxiety with low confidence.   F died 2  years ago and M 67 yo and is dependent . She is working hard to feel better but still feels depressed.  She almost feels like she has a little more anxiety since restarting Abilify every other day. Plan: continue fluoxetine to 20 mg daily DC Abilify .  Vrayalar 1.5 mg QOD to try to get rid of depression ASAP. continue Adderall to XR 20 mg AM  11/10/2021 appointment with the following noted: Busy with Xmas and it was fun with family but then a big let down.  Did well with it.  Functioned well with it.  Working hard on things with depression.  Not shutting down. Not sure but feels better today but yesterday was hard.  Difficulty dealing with mother.  She won't do anything to help herself.  Yesterday with her all day.  Won't do PT and has isolated herself.    Lack of confidence.   No SE with Vraylar.  11/24/21 urgent appointment appt noted: More and more depressed.   So anxious and doesn't want to be alone but can do so. No appetite. Hurts inside. Has had some  fleeting suicidal thoughts but would not act on them.  Tolerating meds. Has been consistent with Vraylar 1.5 mg every other day, fluoxetine 20 mg daily Plan: Increase Vraylar to 1.5 mg daily Change Prozac to Trintellix 10 mg daily. Discussed side effects of each continue Adderall to XR 20 mg AM  12/27/2021 appointment with the following noted: Not OK.  I feel less depressed but feels bat shit. Not sleeping well.  Extremely anxious. Off and on sleep. 3-4 hours of sleep.   Still having daily SI.  But also become obvious has so much to do.  Overwhelmed by tasks.   Needs anxiety meds to just function. Not more motivated.  Walked yesterday.   Feels afraid like in trouble but not irritable or angry. DC DT agitation Vraylar to 1.5 mg daily Change Prozac to Trintellix 10 mg daily. Hold Adderall to XR 20 mg AM Clonidine 0.1 1/2 tablet twice daily for 2 days and if needed for anxiety and sleep increase to 1 twice daily Ok temporary Ativan 1 mg 3 times daily as needed anxiety  01/05/22 appt noted: Off fluoxetine and  Trintellix.  Only on Ativan, trazodone and Adderall XR 20 plus added clonidine 0.1 mg BID Didn't think she needed to start Trintellix. Not taking Ativan.   Didn't like herself last week. Feels some better today. Wonders if the manic sx Not agitated.  Anxiety kind of calmed down.  A lot to be anxious about situationally.  $ stress. Concerns about downers with meds. Can't access normal personality. ? Lethargy and inability to talk as sE. Plan: Latuda 20-40 mg daily with food. Adderall to XR 20 mg AM Clonidine 0.1 1/2 tablet twice daily  reduce dose to be sure no SE Ok temporary Ativan 1 mg 3 times daily as needed anxiety  01/19/22 appt noted: Taking Latuda 20 mg daily.  Took 40 mg once and felt anxious and  SI Still depressed and not very reactive Anxiety mainly about the depression and fears of the future. She wants to revisit manic sx and thinks it was maybe bc taking delta 8  bc was taking a lot  of it so still doesn't think she's classic bipolar. She wants to only take Prozac bc thinks Latuda is perpetuating depression. Says the delta 8 was very psychaedelic.  When not taking it was not manic.  Sleeping ok again.  Plan: Per her request DC Latuda 20-40 mg daily with food. She wants to continue Prozac alone AMA  Adderall to XR 20 mg AM Clonidine 0.1 1/2 tablet twice daily  reduce dose to be sure no SE Ok temporary Ativan 1 mg 3 times daily as needed anxiety  01/23/2022 phone call complaining of increased anxiety since stopping Latuda.  She will try increasing clonidine.  01/26/2022 phone call not feeling well and wanted to restart the Vraylar.  However notes indicate that had made her agitated therefore she was encouraged to pick up samples of Rexulti 1 mg and start that instead.  02/06/2022 phone call: Stating she felt the Rexulti was helping with depression but she was not sleeping well and obsessing over things.  She was encouraged to increase Rexulti to 2 mg daily and increase trazodone for sleep.  02/09/2022 appointment with the following noted: This was an urgent work in appointment No sleep last night with trazodone 100 mg HS Nothing really better depression or anxiety. Ruminating negative anxious thoughts. Did not tolerate Rexulti because it was causing insomnia.  Does not think it helped depression.  Lacks emotion that she should have.  Lacks her usual personality.  Some hopeless thoughts.  Some death thoughts.  Some suicidal thoughts without plan or intent Plan: DC Rexulti and Prozac & DC trazodone Adderall to XR 20 mg AM Clonidine 0.1 1/tablet twice daily  reduce dose to be sure no SE Ok temporary Ativan 1 mg 3 times daily as needed anxiety Start Seroquel XR 150 mg nightly  03/02/2022 appointment: Angelique Blonder called back a few days after starting Seroquel stating it was making her more anxious and more depressed.  This seemed unlikely as this medicine rarely  ever causes anxiety.  She stopped the medication waited 3 days and called back still had anxiety and depression but thought perhaps the anxiety was a little better.  She did not want to take the Seroquel. She knew about the option of Spravato and wanted to pursue that. Now questions whether to return to Seroquel while waiting to start Spravato bc feels just as bad without it and knows she didn't give it enough time to work.   MADRS 46  ECT-MADRS    Flowsheet Row Clinical Support from 01/08/2023 in [facility] Clinical Support from 08/06/2022 in [facility] Clinical Support from 07/04/2022 in [facility] Clinical Support from 05/21/2022 in [facility] Office Visit from 03/02/2022 in [facility]  MADRS Total Score 21 29 15 27  46      03/14/22 appt noted: Pt received Spravato 56 mg first dose today with some dissociative sx which were not severe.  She was anxious prior to the administration and felt better after receiving lorazepam 1 mg.  No NV, or HA. Wants to continue Spravato. Ongoing depression and desperate to feel better.  I'm not myself DT deprsssion which is most severe in recent history.  Anhedonia.  Low motivation.  Social avoidance. Continues to think all recent med trials are making her worse.  Sleep ok with Seroquel.  03/16/22 appt noted: Received Spravato 84 mg for the first time.  some dissociative sx which were not severe.  She  was anxious prior to the administration and felt better after receiving lorazepam 1 mg.  No NV, or HA. Wants to continue Spravato.   Does not feel any better or different since the last appt.  Ongoing depression.  Ongoing depression and desperate to feel better.  I'm not myself DT deprsssion which is most severe in recent history.  Anhedonia.  Low motivation.  Social avoidance. Continues to think all recent med trials are  making her worse.  Sleep ok with Seroquel.  Does not want to continue Seroquel for TRD.  03/20/2022 appointment noted: Came for Spravato administration today.  However blood pressure was significantly elevated approximately 180/115.  She was given lorazepam 1 mg and clonidine 0.2 mg to try to get it down. She states she regretted stopping the Seroquel XR 300 mg tablets.  She now realizes it was helpful.  She did not sleep much at all last night.  She did not take the Adderall this morning. 2 to 3 hours after arrival blood pressure was still elevated at  170/110, 62 pulse.  For Spravato administration was canceled for today.  She admits to being anxious and depressed.  She is not suicidal.  She is highly motivated to receive the Spravato.  We discussed getting it tomorrow.  03/22/2022 appointment noted: Patient's blood pressure was never stable enough yesterday in order to get her in for Spravato administration.  She was encouraged to see her primary care doctor.  It is better today.  03/26/2022 appointment with the following noted: Blood pressure was better.  Saw her primary care doctor who started on oral Bystolic 2.5 mg daily. Received Spravato 84 mg today as scheduled.  Tolerated it well without nausea or vomiting headache or chest pain or palpitations.  Her blood pressure was borderline but manageable. She remains depressed and anxious.  She is ambivalent about the medicine and desperate to get to feel better.  Continues to have anhedonia and low energy and low motivation and reduced ability to do things.  Less social.  Not suicidal.  03/28/22 appt noted: Received Spravato 84 mg today as scheduled.  Tolerated it well without nausea or vomiting headache or chest pain or palpitations.  Her blood pressure was borderline but manageable. Has not seen any improvement so far.  Tolerating Seroquel.  Inconsistent with Bystolic and BP has been borderline high. Still depressed and anxious and anhedonia.  Low  motivation, energy, productivity. Taking quetiapine and tolerating XR 300 mg nightly.  04/04/22 appt noted: Received Spravato 84 mg today as scheduled.  Tolerated it well without nausea or vomiting headache or chest pain or palpitations.  Her blood pressure was borderline but manageable. Has not seen any improvement so far.  Tolerating Seroquel.   She still tends to think that the medications are making her worse.  She has said this about each of the recent psychiatric medicines including Seroquel.  However her husband thinks she is improved.  She also admits there is some improvement in productivity.  She still feels highly anxious.  She still does not enjoy things as normal.  She still feels desperate to improve as soon as possible. Has been taking Seroquel XR since 03/20/2022  04/10/22 appt noted: Received Spravato 84 mg today as scheduled.  Tolerated it well without nausea or vomiting headache or chest pain or palpitations.  Her blood pressure was borderline but manageable. Has not seen any improvement so far.  Tolerating Seroquel.  Doesn't like Seroquel bc she thinks it flattens here. 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  Tolerated it well without nausea or vomiting headache or chest pain or palpitations.  Her blood pressure was borderline but manageable. Has not seen any improvement so far.  Tolerating Seroquel.  Doesn't like Seroquel bc she thinks it flattens her. Received Spravato 84 mg today as scheduled.  Tolerated it well without nausea or vomiting headache or chest pain or palpitations.  Her blood pressure was borderline but manageable. Has not seen any improvement so far.  Tolerating Seroquel.  Doesn't like Seroquel bc she thinks it flattens here.  We discussed her ambivalence about it. She is starting Auvelity  and has tolerated it the last 2 days without side effect.  She still does not feel like herself and feels flat and not enjoying things with suppressed expressed emotion  04/17/2022 appointment with the following noted: Received Spravato 84 mg today as scheduled.  Tolerated it well without nausea or vomiting headache or chest pain or palpitations.  Her blood pressure was borderline but manageable. Has not seen any improvement so far.  Tolerating Seroquel.  Doesn't like Seroquel bc she thinks it flattens her. She has been tolerating the Auvelity 1 in the morning without side effects for about a week.  She has not noticed significant improvement so far.  She still feels depressed and flat and not herself.  Other people notice that she is flat emotionally.  She is not suicidal.  She does feel discouraged that she is not getting better yet.  04/19/2022 appointment noted: Has increased Auvelity to 1 twice daily for 2 days, continues quetiapine XR 300 mg nightly, clonidine 0.3 mg twice daily, lorazepam 1 mg twice daily for anxiety and Adderall XR 20 mg in the morning. No obious SE but she still thinks quetiapine XR is making her feel down.  But not sedated Received Spravato 84 mg today as scheduled.  Tolerated it well without nausea or vomiting headache or chest pain or palpitations.  Her blood pressure was borderline but manageable. She still feels quite anxious and feels it necessary to take both the clonidine and lorazepam twice a day to manage her anxiety.  She has been consistently down and flat and not herself until yesterday afternoon she noted an improvement in mood and feeling much more like herself with her normal personality reemerging.  She was quite depressed in the morning with very dark negative thoughts.  She did not have those dark negative thoughts this morning.  She had a lot of questions about medication and when she was expecting to be improved and why she has not shown improvement up to  now.  04/23/22 appt noted: Has increased Auvelity to 1 twice daily for 1 week, continues quetiapine XR 300 mg nightly, clonidine 0.3 mg twice daily, lorazepam 1 mg twice daily for anxiety and Adderall XR 20 mg in the morning. No obious SE but she still thinks quetiapine XR is making her feel down.  But not sedated Received Spravato 84 mg today as scheduled.  Tolerated it well without nausea or vomiting headache or chest pain or palpitations.  She is still depressed but admits better function and is able to enjoy social interactions. Tolerating meds.  Would like to feel better for sure. Not herself.  Flat. Plan increase Auvelity to 1 tab BID as planned and reduce Quetiapine to 1/2 of ER 300 mg  bc  NR for depression.  04/25/2022 appointment with the following noted: clonidine 0.3 mg twice daily, lorazepam 1 mg twice daily for anxiety and Adderall XR 20 mg in the morning. Seroquel XR 300 HS No obious SE but she still thinks quetiapine XR is making her feel down.  But not sedated Received Spravato 84 mg today as scheduled.  Tolerated it well without nausea or vomiting headache or chest pain or palpitations.  Called yesterday with more anxiety.  Had increased Auvelity for 1 day and reduced Seroquel XR for 1 day.  Felt restless and fearful  05/01/2022 appointment noted: clonidine 0.3 mg twice daily, lorazepam 1 mg twice daily for anxiety and Adderall XR 20 mg in the morning. Seroquel XR 150 HS, Auvelity 1 BID Received Spravato 84 mg today as scheduled.  Tolerated it well without nausea or vomiting headache or chest pain or palpitations.  Nurse has noted patient has called multiple times sometimes asking the same question repeatedly.  It is unclear whether she is truly forgetful or is just anxious seeking reassurance. Patient acknowledges ongoing depression as well as some anxiety but states she has felt a little better in the last couple of days.  She has reduced the Seroquel to 150 mg at night and has  increased Auvelity to 1 twice daily but only for 1 day.  So far she seems to be tolerating it.  05/03/22 appt noted: clonidine 0.2 mg twice daily, lorazepam 1 mg twice daily for anxiety and Adderall XR 20 mg in the morning. Seroquel XR 150 HS, Auvelity 1 BID BP high this am about 170/100 and received extra clonidine 0.2 mg and came to receive Spravato.  Not dizzy, no SOB, nor CP but BP is still high Could not receive Spravato today bc BP high and pulse low at 30 ppm. Still depressed and anxious. Plan: continue trial Auvelity with Spravato She needs to get BP and pulse managed  05/08/22 TC: RTC  H Michael NA and mailbox full.  Could not leave message.  Pt  -  talked to she and H on speaker. H worried over wife.  Vacant stare.  Slurs words at times.  Not smiling. Reduced enjoyment.  Depression.  Withdrawn from usual activities.  Some irritability.  Anxious. Disc her concerns meds are making her worse.  Extensive discussion about her treatment resistant status.  There is a consistent pattern of not taking the medicines long enough to get benefit because she believes the meds are making her worse.  However the symptoms she describes as side effects are exactly the same symptoms that she had prior to taking the medication RX for  the depression.  So it is not clear that these are actual side effects. This is true about the 2 most recent meds including Seroquel and Auvelity.  Recommend psychiatric consultation in hopes of improving her comfort level with taking prescribed medications for a sufficient length of time to provide benefit. Extensive discussion about ECT is the treatment of choice for treatment resistant depression.  Spravato may work if she can comply with consistency.  There are medication options but they take longer to work.   Plan:  Reduce clonidine to 0.1 mg BID DT bradycardia.  Talk with PCP about BP and low pulse problems which are interfering with her consistent compliance with  Spravato.   Limit lorazepam to 3 -4  mg daily max. Excess use is the cause of slurring speech.  She must stop excess use or will have to stop the  med. Stop Auvelity per her request.  But she has only been on the full dose for a little over a week and clearly has not had time to get benefit from it.  She thinks maybe it is making her more anxious. Reduce Seroquel from 150XR to 50 -100 mg at night IR.  She couldn't sleep when stopped it completely. Will not start new antidepressant until her SE issues are resolved or not. Get second psych opinion from Wellington Hampshire MD or another psychiatrist.  H's sister is therapist in [place] Rink, MD, [facility]  05/16/2022 appointment with the following noted: Received Spravato 84 mg today as scheduled.  Tolerated it well without nausea or vomiting headache or chest pain or palpitations.  She stopped Auvelity as discussed last week. On her own, without physician input, she restarted Wellbutrin XL 450 mg every morning today.  She had taken it in the past.  She feels jittery and anxious. She feels less depressed than she did last week.  But she is still depressed without her usual range of affect.  She still is less social and less motivated than normal. Her primary care doctor increased the dose of losartan Plan: Stop Seroquel Reduce Wellbutrin XL to 300 mg every morning.  Starting the dose at 450 every morning is likely causing side effects of jitteriness and it should not be started at that have a dosage. Recommend she not change meds on her own without MDM put  05/23/2022 appointment with the following noted: Received Spravato 84 mg today as scheduled.  Tolerated it well without nausea or vomiting headache or chest pain or palpitations.  Has not dropped seroquel XR 300 mg 1/2 tablet nightly bc couldn't sleep without it. Has not tried lower dose quetiapine 50 mg HS Still feels depressed.   BP is better managed so far, just saw PCP.  BP is better today and  infact is low today. Dropped clonidine as directed from 0.3 mg BID bc inadequate control of BP to 0.2 mg BID.  However she wants to increase it back to 0.3 mg twice daily because she feels it helped her anxiety better.  Wonders about increasing Wellbutrin for depression.  However she has only been on 300 mg a day for a week.  She was on 450 mg daily in the past.  06/06/22 appt noted: Received Spravato 84 mg today as scheduled.  Tolerated it well without nausea or vomiting headache or chest pain or palpitations.  She is still depressed and anxious.  She wants to try to stop the Seroquel but cannot sleep without some of it.  She is taking lorazepam 1 mg 4 times daily and still having a lot of anxiety.  She wants to increase clonidine back to 0.3 mg twice daily.  She hopes for more improvement She recently went for a second psychiatric opinion as suggested the results of that are pending.  06/11/22 appt noted: Received Spravato 84 mg today as scheduled.  Tolerated it well without nausea or vomiting headache or chest pain or palpitations.  She is still depressed and anxious. Without much change.  Still hopeless, anhedonia, reduced inteterest and motivation.  Tolerating meds. Disc concerns Spravato is not hleping much. Plan: stop Seroquel and start olanzapine 10 mg HS for TRD and anxiety.  06/13/2022 appointment noted: Received Spravato 84 mg today as scheduled.  Tolerated it well without nausea or vomiting headache or chest pain or palpitations.  She is still depressed and anxious. Without much change.  Still  hopeless, anhedonia, reduced inteterest and motivation.  Tolerating meds. Disc concerns Spravato is not helping much as hoped but is improving a bit in the last week. Tolerating meds. Continues Wellbutrin XL 450 AM, tolerating recently started olanzapine  10 mg HS. Sleep is good.   Pending appt with TMS consult.  06/18/22 appt noted: Received Spravato 84 mg today as scheduled.  Tolerated it well  without nausea or vomiting headache or chest pain or palpitations.  Tolerating meds. Continues Wellbutrin XL 450 AM, tolerating recently started olanzapine  10 mg HS. Continues Adderall XR 20 amd and has tried to reduce lorazepam to 1mg  TID Sleep is good.   Pending appt with TMS consult. Depression is a little bit better in the last week with a little improvement in emotional expression and interest.  She is pushing herself to be more active.  Her daughter thought she was a little better than she has been.  However she is still depressed and still not her normal self with anhedonia and reduced emotional expressiveness.  06/20/22 appt noted: Received Spravato 84 mg today as scheduled.  Tolerated it well without nausea or vomiting headache or chest pain or palpitations.  Tolerating meds with a little sleepiness. Continues Wellbutrin XL 450 AM, tolerating recently started olanzapine  10 mg HS. Continues Adderall XR 20 amd and has tried to reduce lorazepam to 1mg  TID Sleep is good.   Mood is improving.  Better funciton.  Anxiety is better with olanzapine. Still not herself and depression not gone with some anhedonia and social avoidance and feeling overwhelmed.  8/14 2023 received Spravato 84 mg 06/27/2022 received Spravato Spravato 84 mg 07/02/2022 received Spravato 84 mg 07/04/2022 received Spravato 84 mg  07/09/2022 appointment noted: Received Spravato 84 mg today as scheduled.  Tolerated it well without nausea or vomiting headache or chest pain or palpitations.  Expected dissociation and feels less depressed with resolution of negative emotions immediately after Spravato and then depression, anxiety creep back in. Continues meds Adderall XR 20 mg every morning, Wellbutrin XL 450 every morning, clonidine 0.1 mg twice daily, lorazepam 1 mg every 6 hours as needed, olanzapine increased from 7.5 to 10 mg nightly on  Tolerating meds.  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  She does note some increased appetite and is somewhat concerned about that but has not gained significant amounts of weight. She has had the TMS consultation which was initially denied but she knows it can be appealed.  However because she is improving with Spravato plus the other medications now she wants to continue the current treatment plan.  07/18/22 appt noted: Continues meds Adderall XR 20 mg every morning, Wellbutrin XL 450 every morning, clonidine 0.1 mg twice daily, lorazepam 1 mg every 6 hours as needed, olanzapine increased from 7.5 to 10 mg nightly on 07/04/2022. Received Spravato 84 mg today as scheduled.  Tolerated it well without nausea or vomiting headache or chest pain or palpitations.  Expected dissociation and feels less depressed with resolution of negative emotions immediately after Spravato and then depression, anxiety creep back in. Continues meds Adderall XR 20 mg every morning, Wellbutrin XL 450 every morning, clonidine 0.1 mg twice daily, lorazepam 1 mg every 6 hours as needed, olanzapine increased from 7.5 to 10 mg nightly on  Tolerating meds.  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  She does note some increased appetite and is somewhat concerned about that but has not gained significant amounts of weight. She has had the TMS consultation which was initially denied but she knows it can be appealed. She continues to have chronic ambivalence about psychiatric medicines and initially tends to blame her depressive symptoms such as decreased concentration and feeling flat on what ever medicine she currently is taking even though she had the same symptoms before the current medicines were started.  Then after discussion she does admit that her depressive  symptoms are improved since adding olanzapine but still has those residual symptoms noted.  07/23/22 received Spravato 84 mg   07/30/2022 appointment noted: Received Spravato 84 mg today as scheduled.  Tolerated it well without nausea or vomiting headache or chest pain or palpitations.  Expected dissociation and feels less depressed with resolution of negative emotions immediately after Spravato and then depression, anxiety creep back in. Continues meds Adderall XR 20 mg every morning, Wellbutrin XL 450 every morning, clonidine 0.1 mg twice daily, lorazepam 1 mg every 6 hours as needed, olanzapine increased from 7.5 to 10 mg nightly on  She has been inconsistent with olanzapine because she continues to be ambivalent about the medications in general and thinks that perhaps the 10 mg is making her feel blunted.  She continues to feel some depression.  She had a good day this week and but still feels somewhat depressed and persistently anxious. Plan: be consistent with olanzapine 10 mg HS for TRD and longer trial for potential benefit for anxiety.  Has not taken it consistently.  08/06/2022 appointment noted: Received Spravato 84 mg today as scheduled.  Tolerated it well without nausea or vomiting headache or chest pain or palpitations.  Expected dissociation and feels less depressed with resolution of negative emotions immediately after Spravato and then depression, anxiety creep back in. Continues meds Adderall XR 20 mg every morning, Wellbutrin XL 450 every morning, clonidine 0.1 mg twice daily, lorazepam 1 mg every 6 hours as needed, olanzapine i 10 mg nightly  She continues to feel depressed but is about 50% better with Spravato.  She is still not herself.  She still has anhedonia.  She still is not her able to engage socially in the typical ways.  She is not jovial and outgoing like normal.  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  She continues to feel that her personality is dampened down.  There is a question about whether it is related to depression or medication. Plan: continue olanzapine 10 for longer trial for TRD and severe anxiety.  08/13/22 appt noted:  Received Spravato 84 mg today as scheduled.  Tolerated it well without nausea or vomiting headache or chest pain or palpitations.  Expected dissociation and feels less depressed with resolution of negative emotions immediately after Spravato and then depression, anxiety creep back in. Continues meds Adderall XR 20 mg every morning, Wellbutrin XL 450 every morning, clonidine 0.1 mg twice daily, lorazepam 1 mg every 6 hours as needed, olanzapine i 10 mg nightly  She still does not feel herself.  Still struggles with depression and low motivation and reduced social engagement and reduced interest and reduced emotional expression.  She is somewhat better with the medicines plus Spravato.  She still believes the [employer]  makes her blunted and is not sure how much it helps her anxiety.  She can have good days when her family is around and she is engaged.  She still wants to stop the olanzapine. She has apparently continued to take the trazodone despite having been told to stop it when she started olanzapine.  She feels like she needs the trazodone. Plan: DC olanzapine and Start nortriptyline 25 mg nightly and build up to 75 mg nightly and then check blood level.    08/27/2022 appointment noted: Received Spravato 84 mg today as scheduled.  Tolerated it well without nausea or vomiting headache or chest pain or palpitations.  Expected dissociation and feels less depressed with resolution of negative emotions immediately after Spravato and then depression, anxiety creep back in. Continues meds Adderall XR 20 mg every morning, Wellbutrin XL 450 every morning, clonidine 0.1 mg twice daily, lorazepam 1 mg every 6 hours as needed. Stopped  olanzapine and started nortriptyline which she has taken for about a week is 75 mg nightly. So far she is tolerating the nortriptyline well with the exception of some dry mouth and constipation which she is working to manage.  She does not feel substantially better better or different off the olanzapine.  No change in her sleep which is good.  Main concern currently in addition to the residual depression is anxiety which is somewhat situational with pending arch show.  She is worrying about it more than normal.  Says she is having to take lorazepam twice a day where she had been able to keep reduce it prior to this.  She still does not feel like herself with residual depression with less social interest and less of her usual buoyancy in personality.  She is flatter than normal.  Overall she still feels that the Spravato has been helpful at reducing the severity of the depression.  She is not suicidal. She has not heard anything about the TMS appeal as of yet.  09/05/2022 appointment noted: Received Spravato 84 mg today as scheduled.  Tolerated it well without nausea or vomiting headache or chest pain or palpitations.  Expected dissociation and feels less depressed with resolution of negative emotions immediately after Spravato and then depression, anxiety creep back in. Continues meds Adderall XR 20 mg every morning, Wellbutrin XL 450 every morning, clonidine 0.1 mg twice daily, lorazepam 1 mg every 6 hours as needed. Stopped olanzapine and started nortriptyline which she has taken for about 2 week is 75 mg nightly. Initially blood pressure was a little high causing delay in starting Spravato.  She admitted to feeling a little wound up.  She still experiences a little increase in depression if she goes longer than a week in between doses of Spravato.  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  09/10/22 appt noted: Received Spravato 84 mg today as scheduled.  Tolerated it well without nausea or vomiting headache or chest pain or palpitations.  Expected dissociation gradually resolved over the 2 hour observation period. She feels 50% less depressed with Spravto and wants to continue it.   Continues meds Adderall XR 20 mg every morning, Wellbutrin XL 450 every morning, clonidine 0.1 mg twice daily, lorazepam 1 mg every 6 hours as needed. Has started nortriptyline 75 mg nightly for about 3 weeks. Has not seen a significant difference with the addition of nortriptyline.  Tolerating it  pretty well. She continues to have some degree of anhedonia and significant depression and anxiety.  Her daughters noticed that she is more needy and calls more frequently.  She acknowledges this as well.  She is clearly still not herself. Plan: pramipexole off label and RX 0.25 mg BID  09/17/2022 appointment noted: Received Spravato 84 mg today as scheduled.  Tolerated it well without nausea or vomiting headache or chest pain or palpitations.  Expected dissociation gradually resolved over the 2 hour observation period. She feels 50% less depressed with Spravto and wants to continue it.   Continues meds Adderall XR 20 mg every morning, Wellbutrin XL 450 every morning, clonidine 0.1 mg twice daily, lorazepam 1 mg every 6 hours as needed. Has started nortriptyline 75 mg nightly for about 3 weeks and DT level 176, reduced to 50 mg HS early November. Still the same sx as noted last visit.  Tolerating meds.   Compliant.  Still depressed and family notices.  Has been able to participate in family interactions.  Some post-show let down and has to do detailed work which is hard for her bc ADD.  Sleep and eating well.  Energy OK but not great.  No SI.  Not cried in a year or so.  Clearly less depressed and hopeless than before the Spravato.  09/24/22 appt noted: Received Spravato 84 mg today as scheduled.   Tolerated it well without nausea or vomiting headache or chest pain or palpitations.  Expected dissociation gradually resolved over the 2 hour observation period. She feels 50% less depressed with Spravto and wants to continue it.   Continues meds Adderall XR 20 mg every morning, Wellbutrin XL 450 every morning, clonidine 0.1 mg twice daily, lorazepam 1 mg every 6 hours as needed. Has started nortriptyline 75 mg nightly for about 3 weeks and DT level 176, reduced to 50 mg HS early November. Still the same sx as noted last visit.  Tolerating meds.   Compliant.  Still depressed and family notices.  Has been able to participate in family interactions.  Some post-show let down and has to do detailed work which is hard for her bc ADD.  Sleep and eating well.  Energy OK but not great.  No SI.  Not cried in a year or so.  Clearly less depressed and hopeless than before the Spravato. Is not making further progress generally.  Stuck with moderate depression  10/02/22 appt noted: Received Spravato 84 mg today as scheduled.  Tolerated it well without nausea or vomiting headache or chest pain or palpitations.  Expected dissociation gradually resolved over the 2 hour observation period. She feels 50% less depressed with Spravto and wants to continue it.   Continues meds Adderall XR 20 mg every morning, Wellbutrin XL 450 every morning, clonidine 0.1 mg twice daily, lorazepam 1 mg every 6 hours as needed. Has started nortriptyline 75 mg nightly for about 3 weeks and DT level 176, reduced to 50 mg HS early November. Still the same sx as noted last visit.  Tolerating meds.   Compliant.  Still depressed and family notices.  Has been able to participate in family interactions.  Some post-show let down and has to do detailed work which is hard for her bc ADD.  Sleep and eating well.  Energy OK but not great.  No SI.  Not cried in a year or so.  Clearly less depressed and hopeless than before the Spravato. Is not making  further progress generally.  Stuck with  moderate depression.  Is able to function pretty normally. Plan: trial pramipexole 0.25 mg BID off label for depression.   10/08/22 appt noted: Received Spravato 84 mg today as scheduled.  Tolerated it well without nausea or vomiting headache or chest pain or palpitations.  Expected dissociation gradually resolved over the 2 hour observation period. She feels 50% less depressed with Spravto and wants to continue it.   Continues meds Adderall XR 20 mg every morning, Wellbutrin XL 450 every morning, clonidine 0.1 mg twice daily, lorazepam 1 mg every 6 hours as needed. Has started nortriptyline 75 mg nightly for about 3 weeks and DT level 176, reduced to 50 mg HS early November. Still the same sx as noted last visit.  Tolerating meds.   Compliant.  Still depressed and family notices.  Has been able to participate in family interactions.  Some post-show let down and has to do detailed work which is hard for her bc ADD.  Sleep and eating well.  Energy OK but not great.  No SI.  Not cried in a year or so.  Clearly less depressed and hopeless than before the Spravato. Is not making further progress generally.  Stuck with moderate depression.  Behaved and felt pretty normally with family over for Thanksgiving. Doesn't see benefit or SE with pramipexole but thinks maybe it makes her worse. Plan:  increase pramipexole augmentation off label to 0.5 mg BID  10/15/2022 appointment noted: Received Spravato 84 mg today as scheduled.  Tolerated it well without nausea or vomiting headache or chest pain or palpitations.  Expected dissociation gradually resolved over the 2 hour observation period. She feels 50% less depressed with Spravto and wants to continue it.   Continues meds Adderall XR 20 mg every morning, Wellbutrin XL 450 every morning, clonidine 0.2 mg twice daily, lorazepam 1 mg every 6 hours as needed.  Trazodone 50 mg tablets 1-2 nightly as needed insomnia Has  started nortriptyline 75 mg nightly for about 3 weeks and DT level 176, reduced to 50 mg HS early November. Recommended increase pramipexole 0.5 mg twice daily off label for treatment resistant depression on 10/08/2022. She feels better motivated more active with pramipexole 0.5 mg twice daily.  She still is depressed but it is better.  We discussed the possibility of going up in the dose but did not change it.  10/22/2022 appointment noted: Received Spravato 84 mg today as scheduled.  Tolerated it well without nausea or vomiting headache or chest pain or palpitations.  Expected dissociation gradually resolved over the 2 hour observation period. She feels 50% less depressed with Spravto and wants to continue it.   Continues meds Adderall XR 20 mg every morning, Wellbutrin XL 450 every morning, clonidine 0.2 mg twice daily, lorazepam 1 mg every 8 hours as needed.  Trazodone 50 mg tablets 1-2 nightly as needed insomnia Has started nortriptyline 75 mg nightly for about 3 weeks and DT level 176, reduced to 50 mg HS early November. pramipexole 0.5 mg twice daily off label for treatment resistant depression on 10/08/2022. She is better motivated than she was.  She is journaling 3 pages a day.  She has started walking and has walked 5 days a week for 50 minutes for the last 2 weeks.  That is significantly helped her mood.  Her mood tends to be better when she interacts with family.  However she still has some periods of depression.  She is tolerating the medications well.  She still feels like her affect  and confidence is not back to normal. Plan:  increase pramipexole augmentation off label to 0.5 mg tID or 0.75 mg BID   10/29/22 appt noted: Received Spravato 84 mg today as scheduled.  Tolerated it well without nausea or vomiting headache or chest pain or palpitations.  Expected dissociation gradually resolved over the 2 hour observation period. She feels 50% less depressed with Spravto and wants to  continue it.   Continues meds Adderall XR 20 mg every morning, Wellbutrin XL 450 every morning, clonidine 0.2 mg twice daily, lorazepam 1 mg every 8 hours as needed.  Trazodone 50 mg tablets 1-2 nightly as needed insomnia Has started nortriptyline 75 mg nightly for about 3 weeks and DT level 176, reduced to 50 mg HS early November. pramipexole increased to 0.75 mg twice daily off label for treatment resistant depression  She has been feeling somewhat better with the increase in pramipexole and is tolerating it well.  She still is easily overwhelmed.  Her affect and mood can improve now when around her family or doing something positive.  She has been able to be more productive. She is tolerating the current medications. She is still considering TMS as an alternative to Spravato.  11/15/2022 appointment noted: Received Spravato 84 mg today as scheduled.  Tolerated it well without nausea or vomiting headache or chest pain or palpitations.  Expected dissociation gradually resolved over the 2 hour observation period. She feels 50% less depressed with Spravto and wants to continue it.   Continues meds Adderall XR 20 mg every morning, Wellbutrin XL 450 every morning, clonidine 0.2 mg twice daily, lorazepam 1 mg every 8 hours as needed.  Trazodone 50 mg tablets 1-2 nightly as needed insomnia Has started nortriptyline 75 mg nightly for about 3 weeks and DT level 176, reduced to 50 mg HS early November. pramipexole increased to 0.75 mg twice daily off label for treatment resistant depression  He has noticed some increase in depression due to the length of time since the last Spravato administration.  She believes Spravato is helping her.  sHe is not suicidal but has felt very blue the last few days. She is tolerating the medication. She is ambivalent about Spravato versus TMS but she is considering TMS. She wants to continue Spravato administration because it is clearly helpful.  11/19/22 appt noted: Received  Spravato 84 mg today as scheduled.  Tolerated it well without nausea or vomiting headache or chest pain or palpitations.  Expected dissociation gradually resolved over the 2 hour observation period. She feels 50% less depressed with Spravto and wants to continue it.   Continues meds Adderall XR 20 mg every morning, Wellbutrin XL 450 every morning, clonidine 0.2 mg twice daily, lorazepam 1 mg every 8 hours as needed.  Trazodone 50 mg tablets 1-2 nightly as needed insomnia Has started nortriptyline 75 mg nightly for about 3 weeks and DT level 176, reduced to 50 mg HS early November. pramipexole increased to 0.75 mg twice daily off label for treatment resistant depression  She has felt better back on Spravato more regularly.  However she still has residual depression esp when alone or when wihtout activity.  Can function when needed.   Does not have her connfidence back. She plans to pursue TMS availability. Have discussed retrying Auvelity  11/28/22 appt noted: Received Spravato 84 mg today as scheduled.  Tolerated it well without nausea or vomiting headache or chest pain or palpitations.  Expected dissociation gradually resolved over the 2 hour observation period.  She feels 50% less depressed with Spravto and wants to continue it.   Continues meds Adderall XR 20 mg every morning, Wellbutrin XL 450 every morning, clonidine 0.2 mg twice daily, lorazepam 1 mg every 8 hours as needed.  Trazodone 50 mg tablets 1-2 nightly as needed insomnia Has started nortriptyline 75 mg nightly for about 3 weeks and DT level 176, reduced to 50 mg HS early November. pramipexole increased to 0.75 mg twice daily off label for treatment resistant depression  Last week she was more depressed than usual for reasons that are not clear.  She has not had a clear answer from Filomena Jungling about [employer] options.  States that they have not returned her call.  She is asked about Auvelity retry in place of Wellbutrin.  Has still been  walking.  12/03/22 appt noted: Received Spravato 84 mg today as scheduled.  Tolerated it well without nausea or vomiting headache or chest pain or palpitations.  Expected dissociation gradually resolved over the 2 hour observation period. She feels 50% less depressed with Spravto and wants to continue it.   Continues meds Adderall XR 20 mg every morning, Wellbutrin XL 450 every morning, clonidine 0.2 mg twice daily, lorazepam 1 mg every 8 hours as needed.  Trazodone 50 mg tablets 1-2 nightly as needed insomnia started nortriptyline 75 mg nightly for about 3 weeks and DT level 176, reduced to 50 mg HS early November. pramipexole increased to 0.75 mg twice daily off label for treatment resistant depression  No SE .  Satisfied with meds. Depression is better in the last week.  Not sure why that is the case.  Still walking daily and that helps and journaling 3 pages daily.  Working on Biomedical scientist.  No changes desire.  Clearly benefits from Spravato but not 100%.  Still considering TMS.  12/10/22 appt noted: Received Spravato 84 mg today as scheduled.  Tolerated it well without nausea or vomiting headache or chest pain or palpitations.  Expected dissociation gradually resolved over the 2 hour observation period. She feels 50% less depressed with Spravto and wants to continue it.   Continues meds Adderall XR 20 mg every morning, Wellbutrin XL 450 every morning, clonidine 0.2 mg twice daily, lorazepam 1 mg every 8 hours as needed.  Trazodone 50 mg tablets 1-2 nightly as needed insomnia started nortriptyline 75 mg nightly for about 3 weeks and DT level 176, reduced to 50 mg HS early November. pramipexole increased to 0.75 mg twice daily off label for treatment resistant depression  No SE .   Depression was worse this week for no apparent reason.  She struggled being positive.  She has felt more discouraged.  She has felt more anxious.  She has had a hard time doing tasks.  She is interested in retrying the  [place] which we had discussed previously. Plan: She agrees to [employer].  To improve tolerability and reduce risk of side effects, Stop Wellbutrin and start Auvelity 1 in the morning for 1 week then 1 twice daily AndReduce pramipexole 0.5 mg BID and reduce nortriptyline to 50 mg HS  12/17/22 appt noted: Received Spravato 84 mg today as scheduled.  Tolerated it well without nausea or vomiting headache or chest pain or palpitations.  Expected dissociation gradually resolved over the 2 hour observation period. She feels 50% less depressed with Spravto and wants to continue it.   Continues meds Adderall XR 20 mg every morning, stopped Wellbutrin XL 150 every morning, clonidine 0.2 mg twice daily,  lorazepam 1 mg every 8 hours as needed.  Trazodone 50 mg tablets 1-2 nightly as needed insomnia Has started nortriptyline 75 mg nightly for about 3 weeks and DT level 176, reduced to 50 mg HS early November. pramipexole 0.375 mg twice daily off label for treatment resistant depression with plan to stop Started Auvelity 1 AM Still depressed to moderate degree.  Tolerating Auvelity so far.  No other problems with meds.  Willing to give Auvelity a chance. Plan: She agrees to [employer].  Continue 1 AM and when tolerated then increase to BID Stop pramipexole.  12/26/22 received Spravato  01/01/23 appt noted: Received Spravato 84 mg today as scheduled.  Tolerated it well without nausea or vomiting headache or chest pain or palpitations.  Expected dissociation gradually resolved over the 2 hour observation period. She feels 50% less depressed with Spravto and wants to continue it.   Continues meds Adderall XR 20 mg every morning, stopped Wellbutrin XL 150 every morning, clonidine 0.2 mg twice daily, lorazepam 1 mg every 8 hours as needed.  Trazodone 50 mg tablets 1-2 nightly as needed insomnia Has started nortriptyline 75 mg nightly for about 3 weeks and DT level 176, reduced to 50 mg HS early  November. Forgot to reduce pramipexole stoll taking  0.5 mg twice daily off label for treatment resistant depression  Started Auvelity 1 AM & PM last week. Occ misses Spravato DT htn.this week a better than last.   Painting again more and it helps mood.  Confidence still low and not as likely to socialize as normal but enjoys family.   Still ambivalent about TMS. Tolerating meds.  Including the increase in [place].    01/08/23 appt noted: Received Spravato 84 mg today as scheduled.  Tolerated it well without nausea or vomiting headache or chest pain or palpitations.  Expected dissociation gradually resolved over the 2 hour observation period. She feels 50% less depressed with Spravto and wants to continue it.   Continues meds Adderall XR 20 mg every morning, stopped Wellbutrin XL 150 every morning, clonidine 0.2 mg twice daily, lorazepam 1 mg every 8 hours as needed.  Trazodone 50 mg tablets 1-2 nightly as needed insomnia Has started nortriptyline 75 mg nightly for about 3 weeks and DT level 176, reduced to 50 mg HS early November. reduced pramipexole to 0.5 mg daily off label for treatment resistant depression  Started Auvelity 1 AM & PM mid Feb. More depressed as week progresses.  Thinks it is worse with less pramipexole.  More negative.  Able to function but feels miserable.  No SI.  Hopeless.  Sleep ok.  Tolerating meds.   Talked to [facility] about TMS and appt mid March. Plan: increase pramipexole to 0.5 mg TID for dep bc seemed to worsen with the reduction.  01/15/23 appt noted: Received Spravato 84 mg today as scheduled.  Tolerated it well without nausea or vomiting headache or chest pain or palpitations.  Expected dissociation gradually resolved over the 2 hour observation period. She feels 50% less depressed with Spravto and wants to continue it.   Continues meds Adderall XR 20 mg every morning, stopped Wellbutrin XL 150 every morning, clonidine 0.2 mg twice daily, lorazepam 1 mg every 8  hours as needed.  Trazodone 50 mg tablets 1-2 nightly as needed insomnia Has started nortriptyline 75 mg nightly for about 3 weeks and DT level 176, reduced to 50 mg HS early November. Increased pramipexole to 0.5 mg  to TID  off label for  treatment resistant depression  Started Auvelity 1 AM & PM mid Feb. markedly better depression the increase in pramipexole.  She feels like her depression is almost resolved.  She is very pleased with the response.  She is tolerating the medications well  01/30/23 appt noted: Received Spravato 84 mg today as scheduled.  Tolerated it well without nausea or vomiting headache or chest pain or palpitations.  Expected dissociation gradually resolved over the 2 hour observation period. She feels 50% less depressed with Spravto and wants to continue it.   Continues meds Adderall XR 20 mg every morning, stopped Wellbutrin XL 150 every morning, clonidine 0.2 mg twice daily, lorazepam 1 mg every 8 hours as needed.  Trazodone 50 mg tablets 1-2 nightly as needed insomnia Has started nortriptyline 75 mg nightly for about 3 weeks and DT level 176, reduced to 50 mg HS early November. Increased pramipexole to 0.5 mg  to TID  off label for treatment resistant depression  Started Auvelity 1 AM & PM mid Feb. Mood markedly better with pramipexole added.  Nearly normal mood now with minimal depression.  More social and outgoing and motivated and resolved anhedonia. No SE.  Compliant.  02/04/23 appt noted: Continues meds Adderall XR 20 mg every morning, stopped Wellbutrin XL 150 every morning, clonidine 0.2 mg twice daily, lorazepam 1 mg every 8 hours as needed.  Trazodone 50 mg tablets 1-2 nightly as needed insomnia Has started nortriptyline 75 mg nightly for about 3 weeks and DT level 176, reduced to 50 mg HS early November. Increased pramipexole to 0.5 mg  to TID  off label for treatment resistant depression  Started Auvelity 1 AM & PM mid Feb. Received Spravato 84 mg today as  scheduled.  Tolerated it well without nausea or vomiting headache or chest pain or palpitations.  Expected dissociation gradually resolved over the 2 hour observation period. She feels 50% less depressed with Spravto and wants to continue it.  The pramipexole got her the rest of the way better.  Doesn't want to cut back on any tx bc afraid of relapse. Tolerating meds.  02/15/23 appt noted: Continues meds Adderall XR 20 mg every morning, stopped Wellbutrin XL 150 every morning, clonidine 0.2 mg twice daily, lorazepam 1 mg every 8 hours as needed.  Trazodone 50 mg tablets 1-2 nightly as needed insomnia Has started nortriptyline 75 mg nightly for about 3 weeks and DT level 176, reduced to 50 mg HS early November. Increased pramipexole to 0.5 mg  to TID  off label for treatment resistant depression  Started Auvelity 1 AM & PM mid Feb. Received Spravato 84 mg today as scheduled.  Tolerated it well without nausea or vomiting headache or chest pain or palpitations.  Expected dissociation gradually resolved over the 2 hour observation period. She feels no longer depressed.   The pramipexole got her the rest of the way better.  Doesn't want to cut back on any tx bc afraid of relapse. Tolerating meds.  02/18/23 appt noted: Continues meds Adderall XR 20 mg every morning, stopped Wellbutrin XL 150 every morning, clonidine 0.2 mg twice daily, lorazepam 1 mg every 8 hours as needed.  Trazodone 50 mg tablets 1-2 nightly as needed insomnia Has started nortriptyline 75 mg nightly for about 3 weeks and DT level 176, reduced to 50 mg HS early November. Increased pramipexole to 0.5 mg  to TID  off label for treatment resistant depression  Started Auvelity 1 AM & PM mid Feb. Received Spravato 84 mg  today as scheduled.  Tolerated it well without nausea or vomiting headache or chest pain or palpitations.  Expected dissociation gradually resolved over the 2 hour observation period. She feels no longer depressed except when  ran out of Auvelity this week DT lack of availability.  Much worse without it..   The pramipexole got her the rest of the way better.  Doesn't want to cut back on any tx bc afraid of relapse. Tolerating meds.  02/25/23 appt: Received Spravato 84 mg today as scheduled.  Tolerated it well without nausea or vomiting headache or chest pain or palpitations.  Expected dissociation gradually resolved over the 2 hour observation period. Continues meds Adderall XR 20 mg every morning, stopped Wellbutrin XL 150 every morning, clonidine 0.2 mg twice daily, lorazepam 1 mg every 8 hours as needed.  Trazodone 50 mg tablets 1-2 nightly as needed insomnia Has started nortriptyline 75 mg nightly for about 3 weeks and DT level 176, reduced to 50 mg HS early November. Increased pramipexole to 0.5 mg  to TID  off label for treatment resistant depression  Started Auvelity 1 AM & PM mid Feb. No SE Still dramatically better with meds and Spravato.  Not 100% over depression. Had GS born last week and able to enjoy it now.  Is a little sleepy with pramipexole but manageable.  No change desired.  Sleep is ok.  03/04/23 appt noted: Received Spravato 84 mg today as scheduled.  Tolerated it well without nausea or vomiting headache or chest pain or palpitations.  Expected dissociation gradually resolved over the 2 hour observation period. Continues meds Adderall XR 20 mg every morning, stopped Wellbutrin XL 150 every morning, clonidine 0.2 mg twice daily, lorazepam 1 mg every 8 hours as needed.  Trazodone 50 mg tablets 1-2 nightly as needed insomnia nortriptyline reduced to 50 mg HS early November. Increased pramipexole to 0.5 mg  to TID  off label for treatment resistant depression  Started Auvelity 1 AM & PM mid Feb. Still generally doing well with meds and Spravato except when got tooth abscess.  Freels more depressed after dental surgery.   No SE with meds.   Sleep is OK.  No med changes desired  03/11/23 appt  noted: Received Spravato 84 mg today as scheduled.  Tolerated it well without nausea or vomiting headache or chest pain or palpitations.  Expected dissociation gradually resolved over the 2 hour observation period. Continues meds Adderall XR 20 mg every morning, stopped Wellbutrin XL 150 every morning, clonidine 0.2 mg twice daily, lorazepam 1 mg every 8 hours as needed.  Trazodone 50 mg tablets 1-2 nightly as needed insomnia nortriptyline reduced to 50 mg HS early November. Increased pramipexole to 0.5 mg  to TID  off label for treatment resistant depression  Started Auvelity 1 AM & PM mid Feb. Some increase depression with infection and needing amoxiciliin this week.  Otherwie still doing well with mood and meds. No med changes desired or indicated. No SE  03/21/2023 appointment noted: Continues meds Adderall XR 20 mg every morning, stopped Wellbutrin XL 150 every morning, clonidine 0.2 mg twice daily, lorazepam 1 mg every 8 hours as needed.  Trazodone 50 mg tablets 1-2 nightly as needed insomnia nortriptyline reduced to 50 mg HS early November. Increased pramipexole to 0.5 mg  to TID  off label for treatment resistant depression  Started Auvelity 1 AM & PM mid Feb. Received Spravato 84 mg today as scheduled.  Tolerated it well without nausea or vomiting headache  or chest pain or palpitations.  Expected dissociation gradually resolved over the 2 hour observation period. Depression is much better.  Pretty much resolved this week.  She feels the Auvelity and pramipexole have made the biggest difference.  Obviously the Spravato is also helping significantly.  She wants to continue all of these medications.  She still has anxiety easily.  Especially facing something that she would rather avoid.  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 Tolerating meds without significant side effects. Plan no med changes  03/29/23 appt  noted: Continues meds Adderall XR 20 mg every morning, stopped Wellbutrin XL 150 every morning, clonidine 0.2 mg twice daily, lorazepam 1 mg every 8 hours as needed.  Trazodone 50 mg tablets 1-2 nightly as needed insomnia, nortriptyline 50 mg HS,  pramipexole 0.5 mg  to TID  off label for treatment resistant depression  Started Auvelity 1 AM & PM mid Feb 2024 No SE Received Spravato 84 mg today as scheduled.  Tolerated it well without nausea or vomiting headache or chest pain or palpitations.  Expected dissociation gradually resolved over the 2 hour observation period. Depression continues to improve to a point of very mild sx.  Still hasn't gotten her confidence fully back.  She does enjoy social interactions and is productive at work. She is tolerating the meds well without side effects.  We discussed the possibility of reducing some of the medication given the marked response which was positive with pramipexole.  04/04/23 appt noted: Continues meds Adderall XR 20 mg every morning, stopped Wellbutrin XL 150 every morning, clonidine 0.2 mg twice daily, lorazepam 1 mg every 8 hours as needed.  Trazodone 50 mg tablets 1-2 nightly as needed insomnia, nortriptyline 50 mg HS,  pramipexole 0.5 mg  to TID  off label for treatment resistant depression  Started Auvelity 1 AM & PM mid Feb 2024 No SE Received Spravato 84 mg today as scheduled.  Tolerated it well without nausea or vomiting headache or chest pain or palpitations.  Expected dissociation gradually resolved over the 2 hour observation period. Dep is still much improved with increase in pramipexole.  Largely resolved.  Still gets anxious but takes less lorazepam prn but still needs it.  Sleep ok.  Tolerating meds.  04/10/23 appt noted: Continues meds Adderall XR 20 mg every morning, clonidine 0.2 mg twice daily, lorazepam 1 mg every 8 hours as needed.  Trazodone 50 mg tablets 1-2 nightly as needed insomnia, nortriptyline 50 mg HS,  pramipexole 0.5 mg   to TID  off label for treatment resistant depression  Started Auvelity 1 AM & PM mid Feb 2024 No SE.  Satisfied with meds.   Received Spravato 84 mg today as scheduled.  Tolerated it well without nausea or vomiting headache or chest pain or palpitations.  Expected dissociation gradually resolved over the 2 hour observation period. Dep is still much improved with increase in pramipexole.  Largely resolved.  Still gets anxious but takes less lorazepam prn but still needs it.  Sleep ok.  Tolerating meds. Did an art show in another town; I couldn't have done this a year ago.    05/07/23 appt noted: Continues meds Adderall XR 20 mg every morning, clonidine 0.2 mg twice daily, lorazepam 1 mg every 8 hours as needed.  Trazodone 50 mg tablets 1-2 nightly as needed insomnia, nortriptyline 25-50 mg HS,  pramipexole 0.5 mg  to TID  off label for treatment resistant depression  Started Auvelity 1 AM & PM mid Feb 2024 No SE.  Satisfied with meds.   Received Spravato 84 mg today as scheduled.  Tolerated it well without nausea or vomiting headache or chest pain or palpitations.  Expected dissociation gradually resolved over the 2 hour observation period. H says she is her old self.  Dep so much better.  Painting going well.  Anxiety in AM and then better.  Wakes with it.  Structured days more.  Negative self talk gone.  Wonderful to have relief.  Good weekend at a wedding.  Enjoys GS; [place]ie.  More outspoken since the dep but not over the top.   More interest overall and satisfied wth resp.  Agrees to wean nortriptyline but doesn't want other med changes.  05/15/23 received Spravato 84  05/27/23 appt noted: Continues meds Adderall XR 20 mg every morning, clonidine 0.2 mg twice daily, lorazepam 1 mg every 8 hours as needed.  Trazodone 50 mg tablets 1-2 nightly as needed insomnia, pramipexole 0.5 mg  to TID  off label for treatment resistant depression  Started Auvelity 1 AM & PM mid Feb 2024 No SE.  Satisfied with  meds.   Received Spravato 84 mg today as scheduled.  Tolerated it well without nausea or vomiting headache or chest pain or palpitations.  Expected dissociation gradually resolved over the 2 hour observation period. Maybe a little more anxious re: weaning off nortriptyline.  Busy summer.  Extremely prolific painting.   Has a pending show and stressed over preparing.   Enjoys [employer].  Kept him yesterday for a couple of hours.   Dep has been like a cloak with physical sensation for a long time but is much better than it was.  Still not 100% all the time.  Struggles with some chronic guilt issues.  Self talk and CBT on herself yesterday had some. Still some fear about travelling and demands but so much better.   Making people laugh again and socially engaged and H agrees so much better. M has been impossible.  WC and immobile.  She wastes money.   Plan no med changes  06/03/23 appt noted: Meds as above.  No SE.  Satisfied with meds.   Received Spravato 84 mg today as scheduled.  Tolerated it well without nausea or vomiting headache or chest pain or palpitations.  Expected dissociation gradually resolved over the 2 hour observation period. Overall she feels better with Spravato.  She is minimally depressed generally when she is alone and not active but she is staying active socially and with her painting.  This is really helped with her depression and she is having more success.  She still has residual anxiety which she feels she needs to take lorazepam during the day.  She is attempting to minimize it but without much success.  She wants to continue the Spravato because of the dramatic improvement she is seen with this and the medications.  06/11/23 appt noted: Meds as above.  No SE.  Satisfied with meds.   Received Spravato 84 mg today as scheduled.  Tolerated it well without nausea or vomiting headache or chest pain or palpitations.  Expected dissociation gradually resolved over the 2 hour  observation period. Still has some anxiety wihtout reason an benefit lorazepam and usually only 1 mg daily.  Makes her sleepy if takes it in the day. Sees sig benefit with pramipexole 0.5 mg TID and consistent with it. Mood is still  good overall.  But bc mother still can worry about getting older.  Feels too scared about it but working on it. No concerns with meds.    06/20/23 appt noted:  Continues meds Adderall XR 20 mg every morning, clonidine 0.2 mg twice daily, lorazepam 1 mg every 8 hours as needed.  Trazodone 50 mg tablets 1-2 nightly as needed insomnia, pramipexole 0.5 mg  to TID  off label for treatment resistant depression  Started Auvelity 1 AM & PM mid Feb 2024 No SE.  Satisfied with meds.  Needs each as written. Received Spravato 84 mg today as scheduled.  Tolerated it well without nausea or vomiting headache or chest pain or palpitations.  Expected dissociation gradually resolved over the 2 hour observation period. Marked benefit Spravato and meds remain.  No manic sx.  Residual anxiety is gradually improving.  Sleep is ok. Some residual anxiey at time.  Enjoys GS and family and functioning fine.  ECT-MADRS    Flowsheet Row Clinical Support from 01/08/2023 in [facility] Clinical Support from 08/06/2022 in [facility] Clinical Support from 07/04/2022 in [facility] Clinical Support from 05/21/2022 in [facility] Office Visit from 03/02/2022 in [facility]  MADRS Total Score 21 29 15 27  46      Past Psychiatric Medication Trials: fluoxetine, duloxetine, Viibryd, Pristiq, sertraline, citalopram,  Trintellix anxious and SI Wellbutrin XL 450 Auvelity 1 dose nortriptyline 75 mg nightly for about 3 weeks and DT level 176, reduced to 50 mg HS NR. Pramipexole 1 mg NR Adderall, Adderall XR, Vyvanse, Ritalin, Strattera low dose  NR Lorazepam Trazodone  Depakote,  lamotrigine cog complaints Lithium remotely Abilify 7.5  Vraylar 1.5 mg daily agitation and insomnia Rexulti insomnia Latuda 40 one dose, CO anxious and SI Seroquel XR 300 Olanzapine 10  At visit November 12, 2019. We discussed Patient developed an increasingly severe alcohol dependence problem since her last visit in January.  She went to [facility] and has had no alcohol abuse since .  She never abused stimulants but they took her off the stimulants at [facility].  Her ADD was markedly worse.  The Wellbutrin did not help the ADD.   D history lamotrigine rash at 67 yo  Review of Systems:  Review of Systems  Musculoskeletal:  Positive for back pain. Negative for arthralgias and joint swelling.       SP hip surgery October 2020  Psychiatric/Behavioral:  Negative for agitation, behavioral problems, confusion, decreased concentration, dysphoric mood, hallucinations, self-injury, sleep disturbance and suicidal ideas. The patient is nervous/anxious. The patient is not hyperactive.        Forgetful at times about med recommendations but better now    Medications: I have reviewed the patient's current medications.  Current Outpatient Medications  Medication Sig Dispense Refill   amLODipine (NORVASC) 2.5 MG tablet Take 2.5 mg by mouth daily.     amphetamine-dextroamphetamine (ADDERALL XR) 20 MG 24 hr capsule Take 1 capsule (20 mg total) by mouth every morning. 30 capsule 0   amphetamine-dextroamphetamine (ADDERALL XR) 20 MG 24 hr capsule Take 1 capsule (20 mg total) by mouth every morning. 30 capsule 0   cloNIDine (CATAPRES) 0.2 MG tablet TAKE 1 TABLET BY MOUTH TWICE A DAY 180 tablet 0   Dextromethorphan-buPROPion ER (AUVELITY) 45-105 MG TBCR Take 1 tablet by mouth 2 (two) times daily. 60 tablet 3   Esketamine HCl, 84 MG Dose, (SPRAVATO,  84 MG DOSE,) 28 MG/DEVICE SOPK USE 3 SPRAYS IN EACH NOSTRIL ONCE A WEEK 3 each 1   iron polysaccharides  (NIFEREX) 150 MG capsule TAKE 1 CAPSULE BY MOUTH EVERY DAY 90 capsule 1   LORazepam (ATIVAN) 1 MG tablet Take 1 tablet (1 mg total) by mouth every 8 (eight) hours as needed. for anxiety 90 tablet 1   losartan (COZAAR) 50 MG tablet Take 50 mg by mouth daily.     nebivolol (BYSTOLIC) 2.5 MG tablet Take 2.5 mg by mouth daily.     pramipexole (MIRAPEX) 0.5 MG tablet Take 1 tablet (0.5 mg total) by mouth 3 (three) times daily. 270 tablet 1   traZODone (DESYREL) 50 MG tablet TAKE 1-2 TABLETS BY MOUTH NIGHTLY AS NEEDED FOR SLEEP 180 tablet 1   No current facility-administered medications for this visit.    Medication Side Effects: None  Allergies:  Allergies  Allergen Reactions   Metronidazole Shortness Of Breath and Other (See Comments)    Heart pounding   Ferrlecit [Na Ferric Gluc Cplx In Sucrose] Other (See Comments)    Infusion reaction 05/12/2019    Past Medical History:  Diagnosis Date   ADHD    Anemia    Anxiety    Arthritis    Depression    Heart murmur    i went to see a cardiologit slast eyar  and i had zero plaque,    PONV (postoperative nausea and vomiting)    Recovering alcoholic in remission (HCC)     Family History  Problem Relation Age of Onset   Atrial fibrillation Mother    CAD Father     Past Medical History, Surgical history, Social history, and Family history were reviewed and updated as appropriate.   Please see review of systems for further details on the patient's review from today.   Objective:   Physical Exam:  There were no vitals taken for this visit.  Physical Exam Constitutional:      General: She is not in acute distress. Neurological:     Mental Status: She is alert and oriented to person, place, and time.     Coordination: Coordination normal.     Gait: Gait normal.  Psychiatric:        Attention and Perception: Attention and perception normal.        Mood and Affect: Mood is anxious. Mood is not depressed.        Speech: Speech is  not rapid and pressured or tangential.        Behavior: Behavior is not hyperactive.        Thought Content: Thought content is not paranoid or delusional. Thought content does not include homicidal or suicidal ideation. Thought content does not include suicidal plan.        Cognition and Memory: Cognition normal. Memory is not impaired.        Judgment: Judgment normal.     Comments: Insight intact. No auditory or visual hallucinations. No delusions.  Depression minimal with pramipexole TID and Auvelity generally.  No manic sx     Lab Review:     Component Value Date/Time   NA 137 01/12/2021 1430   NA 140 11/18/2018 1544   K 3.8 01/12/2021 1430   CL 108 01/12/2021 1430   CO2 22 01/12/2021 1430   GLUCOSE 94 01/12/2021 1430   BUN 14 01/12/2021 1430   BUN 20 11/18/2018 1544   CREATININE 0.82 01/12/2021 1430   CALCIUM 8.9 01/12/2021 1430  PROT 6.6 01/12/2021 1430   ALBUMIN 3.9 01/12/2021 1430   AST 12 (L) 01/12/2021 1430   ALT 11 01/12/2021 1430   ALKPHOS 46 01/12/2021 1430   BILITOT 0.5 01/12/2021 1430   GFRNONAA >60 01/12/2021 1430   GFRAA >60 09/02/2019 0249   GFRAA >60 01/27/2019 0811       Component Value Date/Time   WBC 4.5 01/12/2021 1430   RBC 4.32 01/12/2021 1430   HGB 12.8 01/12/2021 1430   HGB 12.9 07/17/2019 0953   HCT 38.5 01/12/2021 1430   HCT 21.9 (L) 12/25/2018 1221   PLT 272 01/12/2021 1430   PLT 286 07/17/2019 0953   MCV 89.1 01/12/2021 1430   MCH 29.6 01/12/2021 1430   MCHC 33.2 01/12/2021 1430   RDW 12.4 01/12/2021 1430   LYMPHSABS 1.4 01/12/2021 1430   MONOABS 0.4 01/12/2021 1430   EOSABS 0.0 01/12/2021 1430   BASOSABS 0.0 01/12/2021 1430    No results found for: "POCLITH", "LITHIUM"   No results found for: "PHENYTOIN", "PHENOBARB", "VALPROATE", "CBMZ"   .res Assessment: Plan:    Recurrent major depression resistant to treatment (HCC)  Generalized anxiety disorder  Attention deficit hyperactivity disorder (ADHD), predominantly  inattentive type  Insomnia due to mental condition  Accelerated hypertension   She has treatment resistant major depression ongoing with 95% better with Spravato. Have  discussed some of her  abnormal behaviors last year leading to this depressive episode getting worse which she says were associated with heavy use of delta 8 and not a manic episode.  She realizes now that that was not good for her.  She stopped all use of other drugs including those available over-the-counter such as delta 8 or any other THC related products.  She is no longer having any of those types of behaviors and instead is depressed.  She is experiencing more pleasure and is less blunted and enjoying more and better inteest versus before the Spravato.  She also feels worse if misses a dose of Spravato.  Depression nearly resolved with pramipexole TID and relapsed when out of Auvelity.  Depression resolved recently after increasing pramipexole 0.5 mg TID, brief relapse when out of Auvelity Consider switch to ER to reduce sleepiness  Patient was administered Spravato 84 mg intranasally today.  The patient experienced the typical dissociation which gradually resolved over the 2-hour period of observation.  There were no complications.  Specifically the patient did not have nausea or vomiting or headache.  Blood pressures monitored at the 40-minute and 2-hour follow-up intervals.  Borderline high.  By the time the 2-hour observation period was met the patient was alert and oriented and able to exit without assistance.  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  See nursing note for further details.She wants to continue Spravato. We discussed discussed the side effects in detail as well as the protocol required to receive Spravato.    has failed all major categories of antidepressants MAO inhibitors which have not been tried.  She is  tolerating Auvelity.  Continue 1  BID bc more depressed without it dramatically.  Relapsed witout it.  Worse with less pramipexole. Increased to 0.5 mg TID on about end of Feb 2024 and markedly better Dosing range off label for depression ranges from 1-5 mg daily.  Disc risk compulsive impulsive behavior and mania.    Started Spravato 84 mg twice weekly on 03/16/2022.  Wants to continue weekly and feels  she needs it.   Disc risk relapse if less than weekly and she doesn't seem to do well with less than weekily.  Adderall  XR 20 mg AM   Ativan 1 mg 3 times daily as needed anxiety but try to cut it back.  She still feels she needs it. Is not ideal to use benzodiazepine with stimulant but because of the severity of her symptoms it has been necessary.  Hope to eventually eliminate the benzodiazepine.  Encourage her to embrace this goal.    Continue clonidine 0.2 mg BID off label for anxiety and helps BP partially. BP is better controlled and more consistent.Rec keep track of BP and discuss with PCP. It has been up and down.  Sometimes needs extra clonidine before Spravato.  Better lately.  Discussed potential benefits, risks, and side effects of stimulants with patient to include increased heart rate, palpitations, insomnia, increased anxiety, increased irritability, or decreased appetite.  Instructed patient to contact office if experiencing any significant tolerability issues. She wants to return to usual dose of Adderall for ADD bc of mor poor cognitive function with reduction.  Also discussed that depression will impair cognitive function.  Disc risk polypharmacy.  Relapsed after running out of Auvelity so needs clearly it and pramipexole.   She continues walkly daily with benefit.  Has Maintained sobriety.    No med changes indicated today: Continue Adderall XR 20 mg every morning Continue Auvelity 1 tablet twice daily,  continue clonidine 0.2 mg twice daily for anxiety and blood  pressure. Continue lorazepam 1 mg 3 times daily she has been unable to cut back on the dose due to anxiety. Continue pramipexole 0.5 mg 3 times daily Continue trazodone 50 mg tablets 1-2 nightly as needed for sleep.  FU with Spravato weekly   Meredith Staggers, MD, DFAPA     Please see After Visit Summary for patient specific instructions.  Future Appointments  Date Time Provider Department Center  06/25/2023  9:30 AM Cottle, Steva Ready., MD [facility] None  06/25/2023  9:30 AM CP-NURSE [facility] None                       No orders of the defined types were placed in this encounter.      -------------------------------

## 2023-06-21 ENCOUNTER — Other Ambulatory Visit: Payer: Self-pay | Admitting: Psychiatry

## 2023-06-23 ENCOUNTER — Encounter: Payer: Self-pay | Admitting: Psychiatry

## 2023-06-23 ENCOUNTER — Other Ambulatory Visit: Payer: Self-pay | Admitting: Psychiatry

## 2023-06-23 DIAGNOSIS — F900 Attention-deficit hyperactivity disorder, predominantly inattentive type: Secondary | ICD-10-CM

## 2023-06-23 MED ORDER — AMPHETAMINE-DEXTROAMPHET ER 20 MG PO CP24
20.0000 mg | ORAL_CAPSULE | ORAL | 0 refills | Status: DC
Start: 2023-06-23 — End: 2023-09-02

## 2023-06-25 ENCOUNTER — Ambulatory Visit: Payer: 59

## 2023-06-25 ENCOUNTER — Ambulatory Visit (INDEPENDENT_AMBULATORY_CARE_PROVIDER_SITE_OTHER): Payer: 59 | Admitting: Psychiatry

## 2023-06-25 VITALS — BP 137/91 | HR 66

## 2023-06-25 DIAGNOSIS — F900 Attention-deficit hyperactivity disorder, predominantly inattentive type: Secondary | ICD-10-CM

## 2023-06-25 DIAGNOSIS — F339 Major depressive disorder, recurrent, unspecified: Secondary | ICD-10-CM | POA: Diagnosis not present

## 2023-06-25 DIAGNOSIS — F1021 Alcohol dependence, in remission: Secondary | ICD-10-CM

## 2023-06-25 DIAGNOSIS — F411 Generalized anxiety disorder: Secondary | ICD-10-CM | POA: Diagnosis not present

## 2023-06-25 DIAGNOSIS — F5105 Insomnia due to other mental disorder: Secondary | ICD-10-CM

## 2023-06-25 NOTE — Progress Notes (Signed)
Laura Chang [id number] 08/23/1956 67 y.o.  Subjective:   Patient ID:  Laura Chang is a 67 y.o. (DOB Nov 09, 1956) female.  Chief Complaint:  Chief Complaint  Patient presents with   Follow-up   Depression   Anxiety   ADD   Sleeping Problem      Laura Chang presents to the office today for follow-up of depression and anxiety and ADD.  seen November 12, 2019.  Melted down in 2020.  Went to [facility] in July.  No withdrawal.  1 drink since then.  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 She was on no stimulant and no SSRI but was taking Strattera and Wellbutrin.  The following changes were made. Stop Strattera. OK restart stimulant bc severe ADD Restart Adderall 1 daily for a few days and if tolerated then restart 1 twice daily. If not tolerated reduce the dosage if needed. May need to stop Wellbutrin if not tolerating the stimulant.  Yes.  DC Wellbutrin Restart Prozac 20 mg daily.  February 2021 appointment with the following noted: Completed grant proposal.  Couldn't doit without Adderall.  Sold a bunch of work.   Adderall XR lasts about 3 pm.  Strength seems about right.  BP been OK.  Not jittery.   Stopped Wellbutrin but had no SE. Mood drastically better with grant proposal and back on fluoxetine.  Less depressed and lethargic.  No anxiety.  Cut back on coffee. Started back with devotions and stronger faith. Plan: Continue Prozac 20 mg daily. May have to increase the dose at some point in the future given that she usually was taking higher dosages but she is getting good response at this time. Restart Wellbutrin off label for ADD since can't get 2 ADDERALL daily. 150 mg daily then 300 mg daily. She can adjust the dose between 150 mg and 300 mg daily to get the optimal effect.   05/11/2020 appointment with the following noted: Has been inconsistent with Prozac and Wellbutrin. Not sure of the effect of Wellbutrin. Biggest deterrent in  work is anxiety.  Some of the work is conceptual and difficult at times.  Can feel she's not up to a project at times.  Overall is OK but would like a steadier benefit from stimulant.  Exhausted from managing concentration and keeping up with things from the day.  Loses things.  Not good keeping up with schedule. Overall productive and emotionally OK. Can feel Adderall wear off. Mood is better in summer and worse in the winter.   F died in 10/13/23 and that is a loss. No SE Wellbutrin. Still attends AA meetings.  Real benefit from [facility] last year. Recognizes effect of anemia on ADD and mood.  Had iron infusions last winter. Plan:  Wellbutrin off label for ADD since can't get 2 ADDERALL daily. 150 mg daily then 300 mg daily.  01/24/2021 appointment with following noted: Doing a program called [employer] mindfulness app since Xmas.  CBT app helped the depression.  App helped her focus better.  Lost sign weight. Writing a lot. Before Xmas felt depressed and started negative thinking worse, self denigrating. Not drinking. More isolated.   Recognizes mo is narcissist.    Didn't tell anyone she was born until 3 mos later.  M aloof and uninterested in pt.  Lied about her birthday.  Mo lack of affection even with pt's kids. Going to AA for a year and it helped her to quit drinking. Also  misses kids being gone with a hole also. Plan: No med changes  05/04/2021 appointment with the following noted: Therapist [name] thinks she's manic. Lost weight to 144#.   States she is still sleeping okay.  Admits she is hyper and recognizes that she is likely manic.  She feels great, euphoric with an increased sense of spiritual connectedness to God.  She has racing thoughts and talks fast and talks a lot and this is noted by her husband.  He thinks she is a bit hyper.  She has been able to maintain sobriety although she will have 1 glass of wine on special occasions but does not drink by herself.  She  is not drinking to excess.  She denies any dangerous impulsivity.  She is clearly not depressed and not particularly anxious.  She has no concerns about her medication and she has been compliant.  06/16/21 appt noted: So much better.  Going through a lot but the manic thing happened on top of it.  So much slower.  Didn't feel like losing anything with risperidone.  Likes the Adderalll at 10 mg. Some drowsiness in the AM and very drowsy from risperidone 2 mg HS. Prayer life is better. Handling stress better. Less depressed with risperidone. Still likes trazodone. Sleeps well. Plan: Reduce Prozac to 10 mg daily.  Consider stopping it because it can feel the mania however she is reluctant to do that because she fears relapse of depression. Reduce risperidone to 1.5 mg nightly due to side effects.  Discussed risk of worsening mania.  07/25/2021 appointment with the following noted: Misses the Adderall and hard to function without it. Depressed now. Heavy chest.  Anxious and guilty.  Body feels heavy.   Hates Wellbutrin.   Plan: Increase fluoxetine to 20 mg daily Add Abilify 1/2 of 15 mg tablet daily Wean wellbutrin by 1 tablet each week  bc she feels it is not helpful and DT polypharmacy Reduce risperidone to 1 daily for 1 week and stop it. Disc risk of mania. Increase Adderall to XR 20 mg AM  08/08/21 Much less depressed and starting to feel normal I feel a lot better. No SE.  Speech normal off risperidone. Sleeping OK on trazaodone and enough.   Noticed benefit from Adderall again. Plan: continue fluoxetine to 20 mg daily Continue Abilify 1/2 of 15 mg tablet daily for depression and mania continue Adderall to XR 20 mg AM  10/10/2021 phone call: Pt stated she feels like the Abilify should be decreased to 5mg .She said she is depressed but rational and not suicidal.She has an appt Monday and can wait until then if you prefer. MD response: Reduce the Abilify to 7.5 mg every other day.  We will  meet on 10/16/2021 and decide what to do from there.  10/16/2021 appointment with the following noted: More depressed.  Most depressed I've ever been.  Just numb.  Sense of grief.   Thinks the manic episode was unlike anything else she ever had.  Doesn't want to medicate against it.  Don't enjoy people.  Easily overwhelmed.  Had some death thoughts but not suicidal.  Has been functional.  Feels better today after reducing Abilify to every other day but she is only been doing that for 3 days. A/P: Episode of post manic depression was explained. continue fluoxetine to 20 mg daily Hold Abilify for 1 week then resume Abilify 1/2 of 15 mg tablet every other day for depression and mania continue Adderall to XR 20  mg AM  10/27/2021 appointment with the following noted: I'm doing so much better.  Handling the depression better. Better self talk and spiritual focus has helped.   Dep 6/10 manifesting as anxiety with low confidence.   F died 2  years ago and M 67 yo and is dependent . She is working hard to feel better but still feels depressed.  She almost feels like she has a little more anxiety since restarting Abilify every other day. Plan: continue fluoxetine to 20 mg daily DC Abilify .  Vrayalar 1.5 mg QOD to try to get rid of depression ASAP. continue Adderall to XR 20 mg AM  11/10/2021 appointment with the following noted: Busy with Xmas and it was fun with family but then a big let down.  Did well with it.  Functioned well with it.  Working hard on things with depression.  Not shutting down. Not sure but feels better today but yesterday was hard.  Difficulty dealing with mother.  She won't do anything to help herself.  Yesterday with her all day.  Won't do PT and has isolated herself.    Lack of confidence.   No SE with Vraylar.  11/24/21 urgent appointment appt noted: More and more depressed.   So anxious and doesn't want to be alone but can do so. No appetite. Hurts inside. Has had some  fleeting suicidal thoughts but would not act on them.  Tolerating meds. Has been consistent with Vraylar 1.5 mg every other day, fluoxetine 20 mg daily Plan: Increase Vraylar to 1.5 mg daily Change Prozac to Trintellix 10 mg daily. Discussed side effects of each continue Adderall to XR 20 mg AM  12/27/2021 appointment with the following noted: Not OK.  I feel less depressed but feels bat shit. Not sleeping well.  Extremely anxious. Off and on sleep. 3-4 hours of sleep.   Still having daily SI.  But also become obvious has so much to do.  Overwhelmed by tasks.   Needs anxiety meds to just function. Not more motivated.  Walked yesterday.   Feels afraid like in trouble but not irritable or angry. DC DT agitation Vraylar to 1.5 mg daily Change Prozac to Trintellix 10 mg daily. Hold Adderall to XR 20 mg AM Clonidine 0.1 1/2 tablet twice daily for 2 days and if needed for anxiety and sleep increase to 1 twice daily Ok temporary Ativan 1 mg 3 times daily as needed anxiety  01/05/22 appt noted: Off fluoxetine and  Trintellix.  Only on Ativan, trazodone and Adderall XR 20 plus added clonidine 0.1 mg BID Didn't think she needed to start Trintellix. Not taking Ativan.   Didn't like herself last week. Feels some better today. Wonders if the manic sx Not agitated.  Anxiety kind of calmed down.  A lot to be anxious about situationally.  $ stress. Concerns about downers with meds. Can't access normal personality. ? Lethargy and inability to talk as sE. Plan: Latuda 20-40 mg daily with food. Adderall to XR 20 mg AM Clonidine 0.1 1/2 tablet twice daily  reduce dose to be sure no SE Ok temporary Ativan 1 mg 3 times daily as needed anxiety  01/19/22 appt noted: Taking Latuda 20 mg daily.  Took 40 mg once and felt anxious and  SI Still depressed and not very reactive Anxiety mainly about the depression and fears of the future. She wants to revisit manic sx and thinks it was maybe bc taking delta 8  bc was taking a lot  of it so still doesn't think she's classic bipolar. She wants to only take Prozac bc thinks Latuda is perpetuating depression. Says the delta 8 was very psychaedelic.  When not taking it was not manic.  Sleeping ok again.  Plan: Per her request DC Latuda 20-40 mg daily with food. She wants to continue Prozac alone AMA  Adderall to XR 20 mg AM Clonidine 0.1 1/2 tablet twice daily  reduce dose to be sure no SE Ok temporary Ativan 1 mg 3 times daily as needed anxiety  01/23/2022 phone call complaining of increased anxiety since stopping Latuda.  She will try increasing clonidine.  01/26/2022 phone call not feeling well and wanted to restart the Vraylar.  However notes indicate that had made her agitated therefore she was encouraged to pick up samples of Rexulti 1 mg and start that instead.  02/06/2022 phone call: Stating she felt the Rexulti was helping with depression but she was not sleeping well and obsessing over things.  She was encouraged to increase Rexulti to 2 mg daily and increase trazodone for sleep.  02/09/2022 appointment with the following noted: This was an urgent work in appointment No sleep last night with trazodone 100 mg HS Nothing really better depression or anxiety. Ruminating negative anxious thoughts. Did not tolerate Rexulti because it was causing insomnia.  Does not think it helped depression.  Lacks emotion that she should have.  Lacks her usual personality.  Some hopeless thoughts.  Some death thoughts.  Some suicidal thoughts without plan or intent Plan: DC Rexulti and Prozac & DC trazodone Adderall to XR 20 mg AM Clonidine 0.1 1/tablet twice daily  reduce dose to be sure no SE Ok temporary Ativan 1 mg 3 times daily as needed anxiety Start Seroquel XR 150 mg nightly  03/02/2022 appointment: Angelique Blonder called back a few days after starting Seroquel stating it was making her more anxious and more depressed.  This seemed unlikely as this medicine rarely  ever causes anxiety.  She stopped the medication waited 3 days and called back still had anxiety and depression but thought perhaps the anxiety was a little better.  She did not want to take the Seroquel. She knew about the option of Spravato and wanted to pursue that. Now questions whether to return to Seroquel while waiting to start Spravato bc feels just as bad without it and knows she didn't give it enough time to work.   MADRS 46  ECT-MADRS    Flowsheet Row Clinical Support from 01/08/2023 in [facility] Clinical Support from 08/06/2022 in [facility] Clinical Support from 07/04/2022 in [facility] Clinical Support from 05/21/2022 in [facility] Office Visit from 03/02/2022 in [facility]  MADRS Total Score 21 29 15 27  46      03/14/22 appt noted: Pt received Spravato 56 mg first dose today with some dissociative sx which were not severe.  She was anxious prior to the administration and felt better after receiving lorazepam 1 mg.  No NV, or HA. Wants to continue Spravato. Ongoing depression and desperate to feel better.  I'm not myself DT deprsssion which is most severe in recent history.  Anhedonia.  Low motivation.  Social avoidance. Continues to think all recent med trials are making her worse.  Sleep ok with Seroquel.  03/16/22 appt noted: Received Spravato 84 mg for the first time.  some dissociative sx which were not severe.  She  was anxious prior to the administration and felt better after receiving lorazepam 1 mg.  No NV, or HA. Wants to continue Spravato.   Does not feel any better or different since the last appt.  Ongoing depression.  Ongoing depression and desperate to feel better.  I'm not myself DT deprsssion which is most severe in recent history.  Anhedonia.  Low motivation.  Social avoidance. Continues to think all recent med trials are  making her worse.  Sleep ok with Seroquel.  Does not want to continue Seroquel for TRD.  03/20/2022 appointment noted: Came for Spravato administration today.  However blood pressure was significantly elevated approximately 180/115.  She was given lorazepam 1 mg and clonidine 0.2 mg to try to get it down. She states she regretted stopping the Seroquel XR 300 mg tablets.  She now realizes it was helpful.  She did not sleep much at all last night.  She did not take the Adderall this morning. 2 to 3 hours after arrival blood pressure was still elevated at  170/110, 62 pulse.  For Spravato administration was canceled for today.  She admits to being anxious and depressed.  She is not suicidal.  She is highly motivated to receive the Spravato.  We discussed getting it tomorrow.  03/22/2022 appointment noted: Patient's blood pressure was never stable enough yesterday in order to get her in for Spravato administration.  She was encouraged to see her primary care doctor.  It is better today.  03/26/2022 appointment with the following noted: Blood pressure was better.  Saw her primary care doctor who started on oral Bystolic 2.5 mg daily. Received Spravato 84 mg today as scheduled.  Tolerated it well without nausea or vomiting headache or chest pain or palpitations.  Her blood pressure was borderline but manageable. She remains depressed and anxious.  She is ambivalent about the medicine and desperate to get to feel better.  Continues to have anhedonia and low energy and low motivation and reduced ability to do things.  Less social.  Not suicidal.  03/28/22 appt noted: Received Spravato 84 mg today as scheduled.  Tolerated it well without nausea or vomiting headache or chest pain or palpitations.  Her blood pressure was borderline but manageable. Has not seen any improvement so far.  Tolerating Seroquel.  Inconsistent with Bystolic and BP has been borderline high. Still depressed and anxious and anhedonia.  Low  motivation, energy, productivity. Taking quetiapine and tolerating XR 300 mg nightly.  04/04/22 appt noted: Received Spravato 84 mg today as scheduled.  Tolerated it well without nausea or vomiting headache or chest pain or palpitations.  Her blood pressure was borderline but manageable. Has not seen any improvement so far.  Tolerating Seroquel.   She still tends to think that the medications are making her worse.  She has said this about each of the recent psychiatric medicines including Seroquel.  However her husband thinks she is improved.  She also admits there is some improvement in productivity.  She still feels highly anxious.  She still does not enjoy things as normal.  She still feels desperate to improve as soon as possible. Has been taking Seroquel XR since 03/20/2022  04/10/22 appt noted: Received Spravato 84 mg today as scheduled.  Tolerated it well without nausea or vomiting headache or chest pain or palpitations.  Her blood pressure was borderline but manageable. Has not seen any improvement so far.  Tolerating Seroquel.  Doesn't like Seroquel bc she thinks it flattens here. 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  Tolerated it well without nausea or vomiting headache or chest pain or palpitations.  Her blood pressure was borderline but manageable. Has not seen any improvement so far.  Tolerating Seroquel.  Doesn't like Seroquel bc she thinks it flattens her. Received Spravato 84 mg today as scheduled.  Tolerated it well without nausea or vomiting headache or chest pain or palpitations.  Her blood pressure was borderline but manageable. Has not seen any improvement so far.  Tolerating Seroquel.  Doesn't like Seroquel bc she thinks it flattens here.  We discussed her ambivalence about it. She is starting Auvelity  and has tolerated it the last 2 days without side effect.  She still does not feel like herself and feels flat and not enjoying things with suppressed expressed emotion  04/17/2022 appointment with the following noted: Received Spravato 84 mg today as scheduled.  Tolerated it well without nausea or vomiting headache or chest pain or palpitations.  Her blood pressure was borderline but manageable. Has not seen any improvement so far.  Tolerating Seroquel.  Doesn't like Seroquel bc she thinks it flattens her. She has been tolerating the Auvelity 1 in the morning without side effects for about a week.  She has not noticed significant improvement so far.  She still feels depressed and flat and not herself.  Other people notice that she is flat emotionally.  She is not suicidal.  She does feel discouraged that she is not getting better yet.  04/19/2022 appointment noted: Has increased Auvelity to 1 twice daily for 2 days, continues quetiapine XR 300 mg nightly, clonidine 0.3 mg twice daily, lorazepam 1 mg twice daily for anxiety and Adderall XR 20 mg in the morning. No obious SE but she still thinks quetiapine XR is making her feel down.  But not sedated Received Spravato 84 mg today as scheduled.  Tolerated it well without nausea or vomiting headache or chest pain or palpitations.  Her blood pressure was borderline but manageable. She still feels quite anxious and feels it necessary to take both the clonidine and lorazepam twice a day to manage her anxiety.  She has been consistently down and flat and not herself until yesterday afternoon she noted an improvement in mood and feeling much more like herself with her normal personality reemerging.  She was quite depressed in the morning with very dark negative thoughts.  She did not have those dark negative thoughts this morning.  She had a lot of questions about medication and when she was expecting to be improved and why she has not shown improvement up to  now.  04/23/22 appt noted: Has increased Auvelity to 1 twice daily for 1 week, continues quetiapine XR 300 mg nightly, clonidine 0.3 mg twice daily, lorazepam 1 mg twice daily for anxiety and Adderall XR 20 mg in the morning. No obious SE but she still thinks quetiapine XR is making her feel down.  But not sedated Received Spravato 84 mg today as scheduled.  Tolerated it well without nausea or vomiting headache or chest pain or palpitations.  She is still depressed but admits better function and is able to enjoy social interactions. Tolerating meds.  Would like to feel better for sure. Not herself.  Flat. Plan increase Auvelity to 1 tab BID as planned and reduce Quetiapine to 1/2 of ER 300 mg  bc  NR for depression.  04/25/2022 appointment with the following noted: clonidine 0.3 mg twice daily, lorazepam 1 mg twice daily for anxiety and Adderall XR 20 mg in the morning. Seroquel XR 300 HS No obious SE but she still thinks quetiapine XR is making her feel down.  But not sedated Received Spravato 84 mg today as scheduled.  Tolerated it well without nausea or vomiting headache or chest pain or palpitations.  Called yesterday with more anxiety.  Had increased Auvelity for 1 day and reduced Seroquel XR for 1 day.  Felt restless and fearful  05/01/2022 appointment noted: clonidine 0.3 mg twice daily, lorazepam 1 mg twice daily for anxiety and Adderall XR 20 mg in the morning. Seroquel XR 150 HS, Auvelity 1 BID Received Spravato 84 mg today as scheduled.  Tolerated it well without nausea or vomiting headache or chest pain or palpitations.  Nurse has noted patient has called multiple times sometimes asking the same question repeatedly.  It is unclear whether she is truly forgetful or is just anxious seeking reassurance. Patient acknowledges ongoing depression as well as some anxiety but states she has felt a little better in the last couple of days.  She has reduced the Seroquel to 150 mg at night and has  increased Auvelity to 1 twice daily but only for 1 day.  So far she seems to be tolerating it.  05/03/22 appt noted: clonidine 0.2 mg twice daily, lorazepam 1 mg twice daily for anxiety and Adderall XR 20 mg in the morning. Seroquel XR 150 HS, Auvelity 1 BID BP high this am about 170/100 and received extra clonidine 0.2 mg and came to receive Spravato.  Not dizzy, no SOB, nor CP but BP is still high Could not receive Spravato today bc BP high and pulse low at 30 ppm. Still depressed and anxious. Plan: continue trial Auvelity with Spravato She needs to get BP and pulse managed  05/08/22 TC: RTC  H Michael NA and mailbox full.  Could not leave message.  Pt  -  talked to she and H on speaker. H worried over wife.  Vacant stare.  Slurs words at times.  Not smiling. Reduced enjoyment.  Depression.  Withdrawn from usual activities.  Some irritability.  Anxious. Disc her concerns meds are making her worse.  Extensive discussion about her treatment resistant status.  There is a consistent pattern of not taking the medicines long enough to get benefit because she believes the meds are making her worse.  However the symptoms she describes as side effects are exactly the same symptoms that she had prior to taking the medication RX for  the depression.  So it is not clear that these are actual side effects. This is true about the 2 most recent meds including Seroquel and Auvelity.  Recommend psychiatric consultation in hopes of improving her comfort level with taking prescribed medications for a sufficient length of time to provide benefit. Extensive discussion about ECT is the treatment of choice for treatment resistant depression.  Spravato may work if she can comply with consistency.  There are medication options but they take longer to work.   Plan:  Reduce clonidine to 0.1 mg BID DT bradycardia.  Talk with PCP about BP and low pulse problems which are interfering with her consistent compliance with  Spravato.   Limit lorazepam to 3 -4  mg daily max. Excess use is the cause of slurring speech.  She must stop excess use or will have to stop the  med. Stop Auvelity per her request.  But she has only been on the full dose for a little over a week and clearly has not had time to get benefit from it.  She thinks maybe it is making her more anxious. Reduce Seroquel from 150XR to 50 -100 mg at night IR.  She couldn't sleep when stopped it completely. Will not start new antidepressant until her SE issues are resolved or not. Get second psych opinion from Wellington Hampshire MD or another psychiatrist.  H's sister is therapist in [place] Rink, MD, [facility]  05/16/2022 appointment with the following noted: Received Spravato 84 mg today as scheduled.  Tolerated it well without nausea or vomiting headache or chest pain or palpitations.  She stopped Auvelity as discussed last week. On her own, without physician input, she restarted Wellbutrin XL 450 mg every morning today.  She had taken it in the past.  She feels jittery and anxious. She feels less depressed than she did last week.  But she is still depressed without her usual range of affect.  She still is less social and less motivated than normal. Her primary care doctor increased the dose of losartan Plan: Stop Seroquel Reduce Wellbutrin XL to 300 mg every morning.  Starting the dose at 450 every morning is likely causing side effects of jitteriness and it should not be started at that have a dosage. Recommend she not change meds on her own without MDM put  05/23/2022 appointment with the following noted: Received Spravato 84 mg today as scheduled.  Tolerated it well without nausea or vomiting headache or chest pain or palpitations.  Has not dropped seroquel XR 300 mg 1/2 tablet nightly bc couldn't sleep without it. Has not tried lower dose quetiapine 50 mg HS Still feels depressed.   BP is better managed so far, just saw PCP.  BP is better today and  infact is low today. Dropped clonidine as directed from 0.3 mg BID bc inadequate control of BP to 0.2 mg BID.  However she wants to increase it back to 0.3 mg twice daily because she feels it helped her anxiety better.  Wonders about increasing Wellbutrin for depression.  However she has only been on 300 mg a day for a week.  She was on 450 mg daily in the past.  06/06/22 appt noted: Received Spravato 84 mg today as scheduled.  Tolerated it well without nausea or vomiting headache or chest pain or palpitations.  She is still depressed and anxious.  She wants to try to stop the Seroquel but cannot sleep without some of it.  She is taking lorazepam 1 mg 4 times daily and still having a lot of anxiety.  She wants to increase clonidine back to 0.3 mg twice daily.  She hopes for more improvement She recently went for a second psychiatric opinion as suggested the results of that are pending.  06/11/22 appt noted: Received Spravato 84 mg today as scheduled.  Tolerated it well without nausea or vomiting headache or chest pain or palpitations.  She is still depressed and anxious. Without much change.  Still hopeless, anhedonia, reduced inteterest and motivation.  Tolerating meds. Disc concerns Spravato is not hleping much. Plan: stop Seroquel and start olanzapine 10 mg HS for TRD and anxiety.  06/13/2022 appointment noted: Received Spravato 84 mg today as scheduled.  Tolerated it well without nausea or vomiting headache or chest pain or palpitations.  She is still depressed and anxious. Without much change.  Still  hopeless, anhedonia, reduced inteterest and motivation.  Tolerating meds. Disc concerns Spravato is not helping much as hoped but is improving a bit in the last week. Tolerating meds. Continues Wellbutrin XL 450 AM, tolerating recently started olanzapine  10 mg HS. Sleep is good.   Pending appt with TMS consult.  06/18/22 appt noted: Received Spravato 84 mg today as scheduled.  Tolerated it well  without nausea or vomiting headache or chest pain or palpitations.  Tolerating meds. Continues Wellbutrin XL 450 AM, tolerating recently started olanzapine  10 mg HS. Continues Adderall XR 20 amd and has tried to reduce lorazepam to 1mg  TID Sleep is good.   Pending appt with TMS consult. Depression is a little bit better in the last week with a little improvement in emotional expression and interest.  She is pushing herself to be more active.  Her daughter thought she was a little better than she has been.  However she is still depressed and still not her normal self with anhedonia and reduced emotional expressiveness.  06/20/22 appt noted: Received Spravato 84 mg today as scheduled.  Tolerated it well without nausea or vomiting headache or chest pain or palpitations.  Tolerating meds with a little sleepiness. Continues Wellbutrin XL 450 AM, tolerating recently started olanzapine  10 mg HS. Continues Adderall XR 20 amd and has tried to reduce lorazepam to 1mg  TID Sleep is good.   Mood is improving.  Better funciton.  Anxiety is better with olanzapine. Still not herself and depression not gone with some anhedonia and social avoidance and feeling overwhelmed.  8/14 2023 received Spravato 84 mg 06/27/2022 received Spravato Spravato 84 mg 07/02/2022 received Spravato 84 mg 07/04/2022 received Spravato 84 mg  07/09/2022 appointment noted: Received Spravato 84 mg today as scheduled.  Tolerated it well without nausea or vomiting headache or chest pain or palpitations.  Expected dissociation and feels less depressed with resolution of negative emotions immediately after Spravato and then depression, anxiety creep back in. Continues meds Adderall XR 20 mg every morning, Wellbutrin XL 450 every morning, clonidine 0.1 mg twice daily, lorazepam 1 mg every 6 hours as needed, olanzapine increased from 7.5 to 10 mg nightly on  Tolerating meds.  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  She does note some increased appetite and is somewhat concerned about that but has not gained significant amounts of weight. She has had the TMS consultation which was initially denied but she knows it can be appealed.  However because she is improving with Spravato plus the other medications now she wants to continue the current treatment plan.  07/18/22 appt noted: Continues meds Adderall XR 20 mg every morning, Wellbutrin XL 450 every morning, clonidine 0.1 mg twice daily, lorazepam 1 mg every 6 hours as needed, olanzapine increased from 7.5 to 10 mg nightly on 07/04/2022. Received Spravato 84 mg today as scheduled.  Tolerated it well without nausea or vomiting headache or chest pain or palpitations.  Expected dissociation and feels less depressed with resolution of negative emotions immediately after Spravato and then depression, anxiety creep back in. Continues meds Adderall XR 20 mg every morning, Wellbutrin XL 450 every morning, clonidine 0.1 mg twice daily, lorazepam 1 mg every 6 hours as needed, olanzapine increased from 7.5 to 10 mg nightly on  Tolerating meds.  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  She does note some increased appetite and is somewhat concerned about that but has not gained significant amounts of weight. She has had the TMS consultation which was initially denied but she knows it can be appealed. She continues to have chronic ambivalence about psychiatric medicines and initially tends to blame her depressive symptoms such as decreased concentration and feeling flat on what ever medicine she currently is taking even though she had the same symptoms before the current medicines were started.  Then after discussion she does admit that her depressive  symptoms are improved since adding olanzapine but still has those residual symptoms noted.  07/23/22 received Spravato 84 mg   07/30/2022 appointment noted: Received Spravato 84 mg today as scheduled.  Tolerated it well without nausea or vomiting headache or chest pain or palpitations.  Expected dissociation and feels less depressed with resolution of negative emotions immediately after Spravato and then depression, anxiety creep back in. Continues meds Adderall XR 20 mg every morning, Wellbutrin XL 450 every morning, clonidine 0.1 mg twice daily, lorazepam 1 mg every 6 hours as needed, olanzapine increased from 7.5 to 10 mg nightly on  She has been inconsistent with olanzapine because she continues to be ambivalent about the medications in general and thinks that perhaps the 10 mg is making her feel blunted.  She continues to feel some depression.  She had a good day this week and but still feels somewhat depressed and persistently anxious. Plan: be consistent with olanzapine 10 mg HS for TRD and longer trial for potential benefit for anxiety.  Has not taken it consistently.  08/06/2022 appointment noted: Received Spravato 84 mg today as scheduled.  Tolerated it well without nausea or vomiting headache or chest pain or palpitations.  Expected dissociation and feels less depressed with resolution of negative emotions immediately after Spravato and then depression, anxiety creep back in. Continues meds Adderall XR 20 mg every morning, Wellbutrin XL 450 every morning, clonidine 0.1 mg twice daily, lorazepam 1 mg every 6 hours as needed, olanzapine i 10 mg nightly  She continues to feel depressed but is about 50% better with Spravato.  She is still not herself.  She still has anhedonia.  She still is not her able to engage socially in the typical ways.  She is not jovial and outgoing like normal.  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  She continues to feel that her personality is dampened down.  There is a question about whether it is related to depression or medication. Plan: continue olanzapine 10 for longer trial for TRD and severe anxiety.  08/13/22 appt noted:  Received Spravato 84 mg today as scheduled.  Tolerated it well without nausea or vomiting headache or chest pain or palpitations.  Expected dissociation and feels less depressed with resolution of negative emotions immediately after Spravato and then depression, anxiety creep back in. Continues meds Adderall XR 20 mg every morning, Wellbutrin XL 450 every morning, clonidine 0.1 mg twice daily, lorazepam 1 mg every 6 hours as needed, olanzapine i 10 mg nightly  She still does not feel herself.  Still struggles with depression and low motivation and reduced social engagement and reduced interest and reduced emotional expression.  She is somewhat better with the medicines plus Spravato.  She still believes the [employer]  makes her blunted and is not sure how much it helps her anxiety.  She can have good days when her family is around and she is engaged.  She still wants to stop the olanzapine. She has apparently continued to take the trazodone despite having been told to stop it when she started olanzapine.  She feels like she needs the trazodone. Plan: DC olanzapine and Start nortriptyline 25 mg nightly and build up to 75 mg nightly and then check blood level.    08/27/2022 appointment noted: Received Spravato 84 mg today as scheduled.  Tolerated it well without nausea or vomiting headache or chest pain or palpitations.  Expected dissociation and feels less depressed with resolution of negative emotions immediately after Spravato and then depression, anxiety creep back in. Continues meds Adderall XR 20 mg every morning, Wellbutrin XL 450 every morning, clonidine 0.1 mg twice daily, lorazepam 1 mg every 6 hours as needed. Stopped  olanzapine and started nortriptyline which she has taken for about a week is 75 mg nightly. So far she is tolerating the nortriptyline well with the exception of some dry mouth and constipation which she is working to manage.  She does not feel substantially better better or different off the olanzapine.  No change in her sleep which is good.  Main concern currently in addition to the residual depression is anxiety which is somewhat situational with pending arch show.  She is worrying about it more than normal.  Says she is having to take lorazepam twice a day where she had been able to keep reduce it prior to this.  She still does not feel like herself with residual depression with less social interest and less of her usual buoyancy in personality.  She is flatter than normal.  Overall she still feels that the Spravato has been helpful at reducing the severity of the depression.  She is not suicidal. She has not heard anything about the TMS appeal as of yet.  09/05/2022 appointment noted: Received Spravato 84 mg today as scheduled.  Tolerated it well without nausea or vomiting headache or chest pain or palpitations.  Expected dissociation and feels less depressed with resolution of negative emotions immediately after Spravato and then depression, anxiety creep back in. Continues meds Adderall XR 20 mg every morning, Wellbutrin XL 450 every morning, clonidine 0.1 mg twice daily, lorazepam 1 mg every 6 hours as needed. Stopped olanzapine and started nortriptyline which she has taken for about 2 week is 75 mg nightly. Initially blood pressure was a little high causing delay in starting Spravato.  She admitted to feeling a little wound up.  She still experiences a little increase in depression if she goes longer than a week in between doses of Spravato.  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  09/10/22 appt noted: Received Spravato 84 mg today as scheduled.  Tolerated it well without nausea or vomiting headache or chest pain or palpitations.  Expected dissociation gradually resolved over the 2 hour observation period. She feels 50% less depressed with Spravto and wants to continue it.   Continues meds Adderall XR 20 mg every morning, Wellbutrin XL 450 every morning, clonidine 0.1 mg twice daily, lorazepam 1 mg every 6 hours as needed. Has started nortriptyline 75 mg nightly for about 3 weeks. Has not seen a significant difference with the addition of nortriptyline.  Tolerating it  pretty well. She continues to have some degree of anhedonia and significant depression and anxiety.  Her daughters noticed that she is more needy and calls more frequently.  She acknowledges this as well.  She is clearly still not herself. Plan: pramipexole off label and RX 0.25 mg BID  09/17/2022 appointment noted: Received Spravato 84 mg today as scheduled.  Tolerated it well without nausea or vomiting headache or chest pain or palpitations.  Expected dissociation gradually resolved over the 2 hour observation period. She feels 50% less depressed with Spravto and wants to continue it.   Continues meds Adderall XR 20 mg every morning, Wellbutrin XL 450 every morning, clonidine 0.1 mg twice daily, lorazepam 1 mg every 6 hours as needed. Has started nortriptyline 75 mg nightly for about 3 weeks and DT level 176, reduced to 50 mg HS early November. Still the same sx as noted last visit.  Tolerating meds.   Compliant.  Still depressed and family notices.  Has been able to participate in family interactions.  Some post-show let down and has to do detailed work which is hard for her bc ADD.  Sleep and eating well.  Energy OK but not great.  No SI.  Not cried in a year or so.  Clearly less depressed and hopeless than before the Spravato.  09/24/22 appt noted: Received Spravato 84 mg today as scheduled.   Tolerated it well without nausea or vomiting headache or chest pain or palpitations.  Expected dissociation gradually resolved over the 2 hour observation period. She feels 50% less depressed with Spravto and wants to continue it.   Continues meds Adderall XR 20 mg every morning, Wellbutrin XL 450 every morning, clonidine 0.1 mg twice daily, lorazepam 1 mg every 6 hours as needed. Has started nortriptyline 75 mg nightly for about 3 weeks and DT level 176, reduced to 50 mg HS early November. Still the same sx as noted last visit.  Tolerating meds.   Compliant.  Still depressed and family notices.  Has been able to participate in family interactions.  Some post-show let down and has to do detailed work which is hard for her bc ADD.  Sleep and eating well.  Energy OK but not great.  No SI.  Not cried in a year or so.  Clearly less depressed and hopeless than before the Spravato. Is not making further progress generally.  Stuck with moderate depression  10/02/22 appt noted: Received Spravato 84 mg today as scheduled.  Tolerated it well without nausea or vomiting headache or chest pain or palpitations.  Expected dissociation gradually resolved over the 2 hour observation period. She feels 50% less depressed with Spravto and wants to continue it.   Continues meds Adderall XR 20 mg every morning, Wellbutrin XL 450 every morning, clonidine 0.1 mg twice daily, lorazepam 1 mg every 6 hours as needed. Has started nortriptyline 75 mg nightly for about 3 weeks and DT level 176, reduced to 50 mg HS early November. Still the same sx as noted last visit.  Tolerating meds.   Compliant.  Still depressed and family notices.  Has been able to participate in family interactions.  Some post-show let down and has to do detailed work which is hard for her bc ADD.  Sleep and eating well.  Energy OK but not great.  No SI.  Not cried in a year or so.  Clearly less depressed and hopeless than before the Spravato. Is not making  further progress generally.  Stuck with  moderate depression.  Is able to function pretty normally. Plan: trial pramipexole 0.25 mg BID off label for depression.   10/08/22 appt noted: Received Spravato 84 mg today as scheduled.  Tolerated it well without nausea or vomiting headache or chest pain or palpitations.  Expected dissociation gradually resolved over the 2 hour observation period. She feels 50% less depressed with Spravto and wants to continue it.   Continues meds Adderall XR 20 mg every morning, Wellbutrin XL 450 every morning, clonidine 0.1 mg twice daily, lorazepam 1 mg every 6 hours as needed. Has started nortriptyline 75 mg nightly for about 3 weeks and DT level 176, reduced to 50 mg HS early November. Still the same sx as noted last visit.  Tolerating meds.   Compliant.  Still depressed and family notices.  Has been able to participate in family interactions.  Some post-show let down and has to do detailed work which is hard for her bc ADD.  Sleep and eating well.  Energy OK but not great.  No SI.  Not cried in a year or so.  Clearly less depressed and hopeless than before the Spravato. Is not making further progress generally.  Stuck with moderate depression.  Behaved and felt pretty normally with family over for Thanksgiving. Doesn't see benefit or SE with pramipexole but thinks maybe it makes her worse. Plan:  increase pramipexole augmentation off label to 0.5 mg BID  10/15/2022 appointment noted: Received Spravato 84 mg today as scheduled.  Tolerated it well without nausea or vomiting headache or chest pain or palpitations.  Expected dissociation gradually resolved over the 2 hour observation period. She feels 50% less depressed with Spravto and wants to continue it.   Continues meds Adderall XR 20 mg every morning, Wellbutrin XL 450 every morning, clonidine 0.2 mg twice daily, lorazepam 1 mg every 6 hours as needed.  Trazodone 50 mg tablets 1-2 nightly as needed insomnia Has  started nortriptyline 75 mg nightly for about 3 weeks and DT level 176, reduced to 50 mg HS early November. Recommended increase pramipexole 0.5 mg twice daily off label for treatment resistant depression on 10/08/2022. She feels better motivated more active with pramipexole 0.5 mg twice daily.  She still is depressed but it is better.  We discussed the possibility of going up in the dose but did not change it.  10/22/2022 appointment noted: Received Spravato 84 mg today as scheduled.  Tolerated it well without nausea or vomiting headache or chest pain or palpitations.  Expected dissociation gradually resolved over the 2 hour observation period. She feels 50% less depressed with Spravto and wants to continue it.   Continues meds Adderall XR 20 mg every morning, Wellbutrin XL 450 every morning, clonidine 0.2 mg twice daily, lorazepam 1 mg every 8 hours as needed.  Trazodone 50 mg tablets 1-2 nightly as needed insomnia Has started nortriptyline 75 mg nightly for about 3 weeks and DT level 176, reduced to 50 mg HS early November. pramipexole 0.5 mg twice daily off label for treatment resistant depression on 10/08/2022. She is better motivated than she was.  She is journaling 3 pages a day.  She has started walking and has walked 5 days a week for 50 minutes for the last 2 weeks.  That is significantly helped her mood.  Her mood tends to be better when she interacts with family.  However she still has some periods of depression.  She is tolerating the medications well.  She still feels like her affect  and confidence is not back to normal. Plan:  increase pramipexole augmentation off label to 0.5 mg tID or 0.75 mg BID   10/29/22 appt noted: Received Spravato 84 mg today as scheduled.  Tolerated it well without nausea or vomiting headache or chest pain or palpitations.  Expected dissociation gradually resolved over the 2 hour observation period. She feels 50% less depressed with Spravto and wants to  continue it.   Continues meds Adderall XR 20 mg every morning, Wellbutrin XL 450 every morning, clonidine 0.2 mg twice daily, lorazepam 1 mg every 8 hours as needed.  Trazodone 50 mg tablets 1-2 nightly as needed insomnia Has started nortriptyline 75 mg nightly for about 3 weeks and DT level 176, reduced to 50 mg HS early November. pramipexole increased to 0.75 mg twice daily off label for treatment resistant depression  She has been feeling somewhat better with the increase in pramipexole and is tolerating it well.  She still is easily overwhelmed.  Her affect and mood can improve now when around her family or doing something positive.  She has been able to be more productive. She is tolerating the current medications. She is still considering TMS as an alternative to Spravato.  11/15/2022 appointment noted: Received Spravato 84 mg today as scheduled.  Tolerated it well without nausea or vomiting headache or chest pain or palpitations.  Expected dissociation gradually resolved over the 2 hour observation period. She feels 50% less depressed with Spravto and wants to continue it.   Continues meds Adderall XR 20 mg every morning, Wellbutrin XL 450 every morning, clonidine 0.2 mg twice daily, lorazepam 1 mg every 8 hours as needed.  Trazodone 50 mg tablets 1-2 nightly as needed insomnia Has started nortriptyline 75 mg nightly for about 3 weeks and DT level 176, reduced to 50 mg HS early November. pramipexole increased to 0.75 mg twice daily off label for treatment resistant depression  He has noticed some increase in depression due to the length of time since the last Spravato administration.  She believes Spravato is helping her.  sHe is not suicidal but has felt very blue the last few days. She is tolerating the medication. She is ambivalent about Spravato versus TMS but she is considering TMS. She wants to continue Spravato administration because it is clearly helpful.  11/19/22 appt noted: Received  Spravato 84 mg today as scheduled.  Tolerated it well without nausea or vomiting headache or chest pain or palpitations.  Expected dissociation gradually resolved over the 2 hour observation period. She feels 50% less depressed with Spravto and wants to continue it.   Continues meds Adderall XR 20 mg every morning, Wellbutrin XL 450 every morning, clonidine 0.2 mg twice daily, lorazepam 1 mg every 8 hours as needed.  Trazodone 50 mg tablets 1-2 nightly as needed insomnia Has started nortriptyline 75 mg nightly for about 3 weeks and DT level 176, reduced to 50 mg HS early November. pramipexole increased to 0.75 mg twice daily off label for treatment resistant depression  She has felt better back on Spravato more regularly.  However she still has residual depression esp when alone or when wihtout activity.  Can function when needed.   Does not have her connfidence back. She plans to pursue TMS availability. Have discussed retrying Auvelity  11/28/22 appt noted: Received Spravato 84 mg today as scheduled.  Tolerated it well without nausea or vomiting headache or chest pain or palpitations.  Expected dissociation gradually resolved over the 2 hour observation period.  She feels 50% less depressed with Spravto and wants to continue it.   Continues meds Adderall XR 20 mg every morning, Wellbutrin XL 450 every morning, clonidine 0.2 mg twice daily, lorazepam 1 mg every 8 hours as needed.  Trazodone 50 mg tablets 1-2 nightly as needed insomnia Has started nortriptyline 75 mg nightly for about 3 weeks and DT level 176, reduced to 50 mg HS early November. pramipexole increased to 0.75 mg twice daily off label for treatment resistant depression  Last week she was more depressed than usual for reasons that are not clear.  She has not had a clear answer from Filomena Jungling about [employer] options.  States that they have not returned her call.  She is asked about Auvelity retry in place of Wellbutrin.  Has still been  walking.  12/03/22 appt noted: Received Spravato 84 mg today as scheduled.  Tolerated it well without nausea or vomiting headache or chest pain or palpitations.  Expected dissociation gradually resolved over the 2 hour observation period. She feels 50% less depressed with Spravto and wants to continue it.   Continues meds Adderall XR 20 mg every morning, Wellbutrin XL 450 every morning, clonidine 0.2 mg twice daily, lorazepam 1 mg every 8 hours as needed.  Trazodone 50 mg tablets 1-2 nightly as needed insomnia started nortriptyline 75 mg nightly for about 3 weeks and DT level 176, reduced to 50 mg HS early November. pramipexole increased to 0.75 mg twice daily off label for treatment resistant depression  No SE .  Satisfied with meds. Depression is better in the last week.  Not sure why that is the case.  Still walking daily and that helps and journaling 3 pages daily.  Working on Biomedical scientist.  No changes desire.  Clearly benefits from Spravato but not 100%.  Still considering TMS.  12/10/22 appt noted: Received Spravato 84 mg today as scheduled.  Tolerated it well without nausea or vomiting headache or chest pain or palpitations.  Expected dissociation gradually resolved over the 2 hour observation period. She feels 50% less depressed with Spravto and wants to continue it.   Continues meds Adderall XR 20 mg every morning, Wellbutrin XL 450 every morning, clonidine 0.2 mg twice daily, lorazepam 1 mg every 8 hours as needed.  Trazodone 50 mg tablets 1-2 nightly as needed insomnia started nortriptyline 75 mg nightly for about 3 weeks and DT level 176, reduced to 50 mg HS early November. pramipexole increased to 0.75 mg twice daily off label for treatment resistant depression  No SE .   Depression was worse this week for no apparent reason.  She struggled being positive.  She has felt more discouraged.  She has felt more anxious.  She has had a hard time doing tasks.  She is interested in retrying the  [place] which we had discussed previously. Plan: She agrees to [employer].  To improve tolerability and reduce risk of side effects, Stop Wellbutrin and start Auvelity 1 in the morning for 1 week then 1 twice daily AndReduce pramipexole 0.5 mg BID and reduce nortriptyline to 50 mg HS  12/17/22 appt noted: Received Spravato 84 mg today as scheduled.  Tolerated it well without nausea or vomiting headache or chest pain or palpitations.  Expected dissociation gradually resolved over the 2 hour observation period. She feels 50% less depressed with Spravto and wants to continue it.   Continues meds Adderall XR 20 mg every morning, stopped Wellbutrin XL 150 every morning, clonidine 0.2 mg twice daily,  lorazepam 1 mg every 8 hours as needed.  Trazodone 50 mg tablets 1-2 nightly as needed insomnia Has started nortriptyline 75 mg nightly for about 3 weeks and DT level 176, reduced to 50 mg HS early November. pramipexole 0.375 mg twice daily off label for treatment resistant depression with plan to stop Started Auvelity 1 AM Still depressed to moderate degree.  Tolerating Auvelity so far.  No other problems with meds.  Willing to give Auvelity a chance. Plan: She agrees to [employer].  Continue 1 AM and when tolerated then increase to BID Stop pramipexole.  12/26/22 received Spravato  01/01/23 appt noted: Received Spravato 84 mg today as scheduled.  Tolerated it well without nausea or vomiting headache or chest pain or palpitations.  Expected dissociation gradually resolved over the 2 hour observation period. She feels 50% less depressed with Spravto and wants to continue it.   Continues meds Adderall XR 20 mg every morning, stopped Wellbutrin XL 150 every morning, clonidine 0.2 mg twice daily, lorazepam 1 mg every 8 hours as needed.  Trazodone 50 mg tablets 1-2 nightly as needed insomnia Has started nortriptyline 75 mg nightly for about 3 weeks and DT level 176, reduced to 50 mg HS early  November. Forgot to reduce pramipexole stoll taking  0.5 mg twice daily off label for treatment resistant depression  Started Auvelity 1 AM & PM last week. Occ misses Spravato DT htn.this week a better than last.   Painting again more and it helps mood.  Confidence still low and not as likely to socialize as normal but enjoys family.   Still ambivalent about TMS. Tolerating meds.  Including the increase in [place].    01/08/23 appt noted: Received Spravato 84 mg today as scheduled.  Tolerated it well without nausea or vomiting headache or chest pain or palpitations.  Expected dissociation gradually resolved over the 2 hour observation period. She feels 50% less depressed with Spravto and wants to continue it.   Continues meds Adderall XR 20 mg every morning, stopped Wellbutrin XL 150 every morning, clonidine 0.2 mg twice daily, lorazepam 1 mg every 8 hours as needed.  Trazodone 50 mg tablets 1-2 nightly as needed insomnia Has started nortriptyline 75 mg nightly for about 3 weeks and DT level 176, reduced to 50 mg HS early November. reduced pramipexole to 0.5 mg daily off label for treatment resistant depression  Started Auvelity 1 AM & PM mid Feb. More depressed as week progresses.  Thinks it is worse with less pramipexole.  More negative.  Able to function but feels miserable.  No SI.  Hopeless.  Sleep ok.  Tolerating meds.   Talked to [facility] about TMS and appt mid March. Plan: increase pramipexole to 0.5 mg TID for dep bc seemed to worsen with the reduction.  01/15/23 appt noted: Received Spravato 84 mg today as scheduled.  Tolerated it well without nausea or vomiting headache or chest pain or palpitations.  Expected dissociation gradually resolved over the 2 hour observation period. She feels 50% less depressed with Spravto and wants to continue it.   Continues meds Adderall XR 20 mg every morning, stopped Wellbutrin XL 150 every morning, clonidine 0.2 mg twice daily, lorazepam 1 mg every 8  hours as needed.  Trazodone 50 mg tablets 1-2 nightly as needed insomnia Has started nortriptyline 75 mg nightly for about 3 weeks and DT level 176, reduced to 50 mg HS early November. Increased pramipexole to 0.5 mg  to TID  off label for  treatment resistant depression  Started Auvelity 1 AM & PM mid Feb. markedly better depression the increase in pramipexole.  She feels like her depression is almost resolved.  She is very pleased with the response.  She is tolerating the medications well  01/30/23 appt noted: Received Spravato 84 mg today as scheduled.  Tolerated it well without nausea or vomiting headache or chest pain or palpitations.  Expected dissociation gradually resolved over the 2 hour observation period. She feels 50% less depressed with Spravto and wants to continue it.   Continues meds Adderall XR 20 mg every morning, stopped Wellbutrin XL 150 every morning, clonidine 0.2 mg twice daily, lorazepam 1 mg every 8 hours as needed.  Trazodone 50 mg tablets 1-2 nightly as needed insomnia Has started nortriptyline 75 mg nightly for about 3 weeks and DT level 176, reduced to 50 mg HS early November. Increased pramipexole to 0.5 mg  to TID  off label for treatment resistant depression  Started Auvelity 1 AM & PM mid Feb. Mood markedly better with pramipexole added.  Nearly normal mood now with minimal depression.  More social and outgoing and motivated and resolved anhedonia. No SE.  Compliant.  02/04/23 appt noted: Continues meds Adderall XR 20 mg every morning, stopped Wellbutrin XL 150 every morning, clonidine 0.2 mg twice daily, lorazepam 1 mg every 8 hours as needed.  Trazodone 50 mg tablets 1-2 nightly as needed insomnia Has started nortriptyline 75 mg nightly for about 3 weeks and DT level 176, reduced to 50 mg HS early November. Increased pramipexole to 0.5 mg  to TID  off label for treatment resistant depression  Started Auvelity 1 AM & PM mid Feb. Received Spravato 84 mg today as  scheduled.  Tolerated it well without nausea or vomiting headache or chest pain or palpitations.  Expected dissociation gradually resolved over the 2 hour observation period. She feels 50% less depressed with Spravto and wants to continue it.  The pramipexole got her the rest of the way better.  Doesn't want to cut back on any tx bc afraid of relapse. Tolerating meds.  02/15/23 appt noted: Continues meds Adderall XR 20 mg every morning, stopped Wellbutrin XL 150 every morning, clonidine 0.2 mg twice daily, lorazepam 1 mg every 8 hours as needed.  Trazodone 50 mg tablets 1-2 nightly as needed insomnia Has started nortriptyline 75 mg nightly for about 3 weeks and DT level 176, reduced to 50 mg HS early November. Increased pramipexole to 0.5 mg  to TID  off label for treatment resistant depression  Started Auvelity 1 AM & PM mid Feb. Received Spravato 84 mg today as scheduled.  Tolerated it well without nausea or vomiting headache or chest pain or palpitations.  Expected dissociation gradually resolved over the 2 hour observation period. She feels no longer depressed.   The pramipexole got her the rest of the way better.  Doesn't want to cut back on any tx bc afraid of relapse. Tolerating meds.  02/18/23 appt noted: Continues meds Adderall XR 20 mg every morning, stopped Wellbutrin XL 150 every morning, clonidine 0.2 mg twice daily, lorazepam 1 mg every 8 hours as needed.  Trazodone 50 mg tablets 1-2 nightly as needed insomnia Has started nortriptyline 75 mg nightly for about 3 weeks and DT level 176, reduced to 50 mg HS early November. Increased pramipexole to 0.5 mg  to TID  off label for treatment resistant depression  Started Auvelity 1 AM & PM mid Feb. Received Spravato 84 mg  today as scheduled.  Tolerated it well without nausea or vomiting headache or chest pain or palpitations.  Expected dissociation gradually resolved over the 2 hour observation period. She feels no longer depressed except when  ran out of Auvelity this week DT lack of availability.  Much worse without it..   The pramipexole got her the rest of the way better.  Doesn't want to cut back on any tx bc afraid of relapse. Tolerating meds.  02/25/23 appt: Received Spravato 84 mg today as scheduled.  Tolerated it well without nausea or vomiting headache or chest pain or palpitations.  Expected dissociation gradually resolved over the 2 hour observation period. Continues meds Adderall XR 20 mg every morning, stopped Wellbutrin XL 150 every morning, clonidine 0.2 mg twice daily, lorazepam 1 mg every 8 hours as needed.  Trazodone 50 mg tablets 1-2 nightly as needed insomnia Has started nortriptyline 75 mg nightly for about 3 weeks and DT level 176, reduced to 50 mg HS early November. Increased pramipexole to 0.5 mg  to TID  off label for treatment resistant depression  Started Auvelity 1 AM & PM mid Feb. No SE Still dramatically better with meds and Spravato.  Not 100% over depression. Had GS born last week and able to enjoy it now.  Is a little sleepy with pramipexole but manageable.  No change desired.  Sleep is ok.  03/04/23 appt noted: Received Spravato 84 mg today as scheduled.  Tolerated it well without nausea or vomiting headache or chest pain or palpitations.  Expected dissociation gradually resolved over the 2 hour observation period. Continues meds Adderall XR 20 mg every morning, stopped Wellbutrin XL 150 every morning, clonidine 0.2 mg twice daily, lorazepam 1 mg every 8 hours as needed.  Trazodone 50 mg tablets 1-2 nightly as needed insomnia nortriptyline reduced to 50 mg HS early November. Increased pramipexole to 0.5 mg  to TID  off label for treatment resistant depression  Started Auvelity 1 AM & PM mid Feb. Still generally doing well with meds and Spravato except when got tooth abscess.  Freels more depressed after dental surgery.   No SE with meds.   Sleep is OK.  No med changes desired  03/11/23 appt  noted: Received Spravato 84 mg today as scheduled.  Tolerated it well without nausea or vomiting headache or chest pain or palpitations.  Expected dissociation gradually resolved over the 2 hour observation period. Continues meds Adderall XR 20 mg every morning, stopped Wellbutrin XL 150 every morning, clonidine 0.2 mg twice daily, lorazepam 1 mg every 8 hours as needed.  Trazodone 50 mg tablets 1-2 nightly as needed insomnia nortriptyline reduced to 50 mg HS early November. Increased pramipexole to 0.5 mg  to TID  off label for treatment resistant depression  Started Auvelity 1 AM & PM mid Feb. Some increase depression with infection and needing amoxiciliin this week.  Otherwie still doing well with mood and meds. No med changes desired or indicated. No SE  03/21/2023 appointment noted: Continues meds Adderall XR 20 mg every morning, stopped Wellbutrin XL 150 every morning, clonidine 0.2 mg twice daily, lorazepam 1 mg every 8 hours as needed.  Trazodone 50 mg tablets 1-2 nightly as needed insomnia nortriptyline reduced to 50 mg HS early November. Increased pramipexole to 0.5 mg  to TID  off label for treatment resistant depression  Started Auvelity 1 AM & PM mid Feb. Received Spravato 84 mg today as scheduled.  Tolerated it well without nausea or vomiting headache  or chest pain or palpitations.  Expected dissociation gradually resolved over the 2 hour observation period. Depression is much better.  Pretty much resolved this week.  She feels the Auvelity and pramipexole have made the biggest difference.  Obviously the Spravato is also helping significantly.  She wants to continue all of these medications.  She still has anxiety easily.  Especially facing something that she would rather avoid.  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 Tolerating meds without significant side effects. Plan no med changes  03/29/23 appt  noted: Continues meds Adderall XR 20 mg every morning, stopped Wellbutrin XL 150 every morning, clonidine 0.2 mg twice daily, lorazepam 1 mg every 8 hours as needed.  Trazodone 50 mg tablets 1-2 nightly as needed insomnia, nortriptyline 50 mg HS,  pramipexole 0.5 mg  to TID  off label for treatment resistant depression  Started Auvelity 1 AM & PM mid Feb 2024 No SE Received Spravato 84 mg today as scheduled.  Tolerated it well without nausea or vomiting headache or chest pain or palpitations.  Expected dissociation gradually resolved over the 2 hour observation period. Depression continues to improve to a point of very mild sx.  Still hasn't gotten her confidence fully back.  She does enjoy social interactions and is productive at work. She is tolerating the meds well without side effects.  We discussed the possibility of reducing some of the medication given the marked response which was positive with pramipexole.  04/04/23 appt noted: Continues meds Adderall XR 20 mg every morning, stopped Wellbutrin XL 150 every morning, clonidine 0.2 mg twice daily, lorazepam 1 mg every 8 hours as needed.  Trazodone 50 mg tablets 1-2 nightly as needed insomnia, nortriptyline 50 mg HS,  pramipexole 0.5 mg  to TID  off label for treatment resistant depression  Started Auvelity 1 AM & PM mid Feb 2024 No SE Received Spravato 84 mg today as scheduled.  Tolerated it well without nausea or vomiting headache or chest pain or palpitations.  Expected dissociation gradually resolved over the 2 hour observation period. Dep is still much improved with increase in pramipexole.  Largely resolved.  Still gets anxious but takes less lorazepam prn but still needs it.  Sleep ok.  Tolerating meds.  04/10/23 appt noted: Continues meds Adderall XR 20 mg every morning, clonidine 0.2 mg twice daily, lorazepam 1 mg every 8 hours as needed.  Trazodone 50 mg tablets 1-2 nightly as needed insomnia, nortriptyline 50 mg HS,  pramipexole 0.5 mg   to TID  off label for treatment resistant depression  Started Auvelity 1 AM & PM mid Feb 2024 No SE.  Satisfied with meds.   Received Spravato 84 mg today as scheduled.  Tolerated it well without nausea or vomiting headache or chest pain or palpitations.  Expected dissociation gradually resolved over the 2 hour observation period. Dep is still much improved with increase in pramipexole.  Largely resolved.  Still gets anxious but takes less lorazepam prn but still needs it.  Sleep ok.  Tolerating meds. Did an art show in another town; I couldn't have done this a year ago.    05/07/23 appt noted: Continues meds Adderall XR 20 mg every morning, clonidine 0.2 mg twice daily, lorazepam 1 mg every 8 hours as needed.  Trazodone 50 mg tablets 1-2 nightly as needed insomnia, nortriptyline 25-50 mg HS,  pramipexole 0.5 mg  to TID  off label for treatment resistant depression  Started Auvelity 1 AM & PM mid Feb 2024 No SE.  Satisfied with meds.   Received Spravato 84 mg today as scheduled.  Tolerated it well without nausea or vomiting headache or chest pain or palpitations.  Expected dissociation gradually resolved over the 2 hour observation period. H says she is her old self.  Dep so much better.  Painting going well.  Anxiety in AM and then better.  Wakes with it.  Structured days more.  Negative self talk gone.  Wonderful to have relief.  Good weekend at a wedding.  Enjoys GS; Wolfie.  More outspoken since the dep but not over the top.   More interest overall and satisfied wth resp.  Agrees to wean nortriptyline but doesn't want other med changes.  05/15/23 received Spravato 84  05/27/23 appt noted: Continues meds Adderall XR 20 mg every morning, clonidine 0.2 mg twice daily, lorazepam 1 mg every 8 hours as needed.  Trazodone 50 mg tablets 1-2 nightly as needed insomnia, pramipexole 0.5 mg  to TID  off label for treatment resistant depression  Started Auvelity 1 AM & PM mid Feb 2024 No SE.  Satisfied with  meds.   Received Spravato 84 mg today as scheduled.  Tolerated it well without nausea or vomiting headache or chest pain or palpitations.  Expected dissociation gradually resolved over the 2 hour observation period. Maybe a little more anxious re: weaning off nortriptyline.  Busy summer.  Extremely prolific painting.   Has a pending show and stressed over preparing.   Enjoys [employer].  Kept him yesterday for a couple of hours.   Dep has been like a cloak with physical sensation for a long time but is much better than it was.  Still not 100% all the time.  Struggles with some chronic guilt issues.  Self talk and CBT on herself yesterday had some. Still some fear about travelling and demands but so much better.   Making people laugh again and socially engaged and H agrees so much better. M has been impossible.  WC and immobile.  She wastes money.   Plan no med changes  06/03/23 appt noted: Meds as above.  No SE.  Satisfied with meds.   Received Spravato 84 mg today as scheduled.  Tolerated it well without nausea or vomiting headache or chest pain or palpitations.  Expected dissociation gradually resolved over the 2 hour observation period. Overall she feels better with Spravato.  She is minimally depressed generally when she is alone and not active but she is staying active socially and with her painting.  This is really helped with her depression and she is having more success.  She still has residual anxiety which she feels she needs to take lorazepam during the day.  She is attempting to minimize it but without much success.  She wants to continue the Spravato because of the dramatic improvement she is seen with this and the medications.  06/11/23 appt noted: Meds as above.  No SE.  Satisfied with meds.   Received Spravato 84 mg today as scheduled.  Tolerated it well without nausea or vomiting headache or chest pain or palpitations.  Expected dissociation gradually resolved over the 2 hour  observation period. Still has some anxiety wihtout reason an benefit lorazepam and usually only 1 mg daily.  Makes her sleepy if takes it in the day. Sees sig benefit with pramipexole 0.5 mg TID and consistent with it. Mood is still  good overall.  But bc mother still can worry about getting older.  Feels too scared about it but working on it. No concerns with meds.    06/20/23 appt noted:  Continues meds Adderall XR 20 mg every morning, clonidine 0.2 mg twice daily, lorazepam 1 mg every 8 hours as needed.  Trazodone 50 mg tablets 1-2 nightly as needed insomnia, pramipexole 0.5 mg  to TID  off label for treatment resistant depression  Started Auvelity 1 AM & PM mid Feb 2024 No SE.  Satisfied with meds.  Needs each as written. Received Spravato 84 mg today as scheduled.  Tolerated it well without nausea or vomiting headache or chest pain or palpitations.  Expected dissociation gradually resolved over the 2 hour observation period. Marked benefit Spravato and meds remain.  No manic sx.  Residual anxiety is gradually improving.  Sleep is ok. Some residual anxiey at time.  Enjoys GS and family and functioning fine. Plan no med changes  06/25/23 appt noted: Continues meds Adderall XR 20 mg every morning, clonidine 0.2 mg twice daily, lorazepam 1 mg every 8 hours as needed.  Trazodone 50 mg tablets 1-2 nightly as needed insomnia, pramipexole 0.5 mg  to TID  off label for treatment resistant depression  Started Auvelity 1 AM & PM mid Feb 2024 No SE.  Satisfied with meds.  Needs each as written. Received Spravato 84 mg today as scheduled.  Tolerated it well without nausea or vomiting headache or chest pain or palpitations.  Expected dissociation gradually resolved over the 2 hour observation period. Marked benefit Spravato and meds remain.  Minimal depression at this time. No manic sx.  Residual anxiety is gradually improving and she is using much less lorazepam.  Sleep is ok. Some residual anxiey at time.   Enjoys GS and family and functioning fine. Plan no med changes   ECT-MADRS    Flowsheet Row Clinical Support from 01/08/2023 in [facility] Clinical Support from 08/06/2022 in [facility] Clinical Support from 07/04/2022 in [facility] Clinical Support from 05/21/2022 in [facility] Office Visit from 03/02/2022 in [facility]  MADRS Total Score 21 29 15 27  46      Past Psychiatric Medication Trials: fluoxetine, duloxetine, Viibryd, Pristiq, sertraline, citalopram,  Trintellix anxious and SI Wellbutrin XL 450 Auvelity 1 dose nortriptyline 75 mg nightly for about 3 weeks and DT level 176, reduced to 50 mg HS NR. Pramipexole 1 mg NR Adderall, Adderall XR, Vyvanse, Ritalin, Strattera low dose NR Lorazepam Trazodone  Depakote,  lamotrigine cog complaints Lithium remotely Abilify 7.5  Vraylar 1.5 mg daily agitation and insomnia Rexulti insomnia Latuda 40 one dose, CO anxious and SI Seroquel XR 300 Olanzapine 10  At visit November 12, 2019. We discussed Patient developed an increasingly severe alcohol dependence problem since her last visit in January.  She went to [facility] and has had no alcohol abuse since .  She never abused stimulants but they took her off the stimulants at [facility].  Her ADD was markedly worse.  The Wellbutrin did not help the ADD.   D history lamotrigine rash at 67 yo  Review of Systems:  Review of Systems  Musculoskeletal:  Positive for back pain. Negative for arthralgias and joint swelling.       SP hip surgery October 2020  Neurological:  Negative for tremors.  Psychiatric/Behavioral:  Negative for agitation, behavioral problems, confusion, decreased concentration,  dysphoric mood, hallucinations, self-injury, sleep disturbance and suicidal ideas. The patient is nervous/anxious. The patient is not  hyperactive.        Forgetful at times about med recommendations but better now    Medications: I have reviewed the patient's current medications.  Current Outpatient Medications  Medication Sig Dispense Refill   amLODipine (NORVASC) 2.5 MG tablet Take 2.5 mg by mouth daily.     amphetamine-dextroamphetamine (ADDERALL XR) 20 MG 24 hr capsule Take 1 capsule (20 mg total) by mouth every morning. 30 capsule 0   amphetamine-dextroamphetamine (ADDERALL XR) 20 MG 24 hr capsule Take 1 capsule (20 mg total) by mouth every morning. 30 capsule 0   cloNIDine (CATAPRES) 0.2 MG tablet TAKE 1 TABLET BY MOUTH TWICE A DAY 180 tablet 0   Dextromethorphan-buPROPion ER (AUVELITY) 45-105 MG TBCR Take 1 tablet by mouth 2 (two) times daily. 60 tablet 3   Esketamine HCl, 84 MG Dose, (SPRAVATO, 84 MG DOSE,) 28 MG/DEVICE SOPK USE 3 SPRAYS IN EACH NOSTRIL ONCE A WEEK 3 each 1   iron polysaccharides (NIFEREX) 150 MG capsule TAKE 1 CAPSULE BY MOUTH EVERY DAY 90 capsule 1   LORazepam (ATIVAN) 1 MG tablet Take 1 tablet (1 mg total) by mouth every 8 (eight) hours as needed. for anxiety 90 tablet 1   losartan (COZAAR) 50 MG tablet Take 50 mg by mouth daily.     nebivolol (BYSTOLIC) 2.5 MG tablet Take 2.5 mg by mouth daily.     pramipexole (MIRAPEX) 0.5 MG tablet Take 1 tablet (0.5 mg total) by mouth 3 (three) times daily. 270 tablet 1   traZODone (DESYREL) 50 MG tablet TAKE 1-2 TABLETS BY MOUTH NIGHTLY AS NEEDED FOR SLEEP 180 tablet 1   No current facility-administered medications for this visit.    Medication Side Effects: None  Allergies:  Allergies  Allergen Reactions   Metronidazole Shortness Of Breath and Other (See Comments)    Heart pounding   Ferrlecit [Na Ferric Gluc Cplx In Sucrose] Other (See Comments)    Infusion reaction 05/12/2019    Past Medical History:  Diagnosis Date   ADHD    Anemia    Anxiety    Arthritis    Depression    Heart murmur    i went to see a cardiologit slast eyar  and i had  zero plaque,    PONV (postoperative nausea and vomiting)    Recovering alcoholic in remission (HCC)     Family History  Problem Relation Age of Onset   Atrial fibrillation Mother    CAD Father     Past Medical History, Surgical history, Social history, and Family history were reviewed and updated as appropriate.   Please see review of systems for further details on the patient's review from today.   Objective:   Physical Exam:  There were no vitals taken for this visit.  Physical Exam Constitutional:      General: She is not in acute distress. Neurological:     Mental Status: She is alert and oriented to person, place, and time.     Coordination: Coordination normal.     Gait: Gait normal.  Psychiatric:        Attention and Perception: Attention and perception normal.        Mood and Affect: Mood is anxious. Mood is not depressed.        Speech: Speech is not rapid and pressured.        Behavior: Behavior is not hyperactive.  Thought Content: Thought content is not paranoid or delusional. Thought content does not include homicidal or suicidal ideation. Thought content does not include suicidal plan.        Cognition and Memory: Cognition normal. Memory is not impaired.        Judgment: Judgment normal.     Comments: Insight intact. No auditory or visual hallucinations. No delusions.  Depression minimal with pramipexole TID and Auvelity generally.  No manic sx     Lab Review:     Component Value Date/Time   NA 137 01/12/2021 1430   NA 140 11/18/2018 1544   K 3.8 01/12/2021 1430   CL 108 01/12/2021 1430   CO2 22 01/12/2021 1430   GLUCOSE 94 01/12/2021 1430   BUN 14 01/12/2021 1430   BUN 20 11/18/2018 1544   CREATININE 0.82 01/12/2021 1430   CALCIUM 8.9 01/12/2021 1430   PROT 6.6 01/12/2021 1430   ALBUMIN 3.9 01/12/2021 1430   AST 12 (L) 01/12/2021 1430   ALT 11 01/12/2021 1430   ALKPHOS 46 01/12/2021 1430   BILITOT 0.5 01/12/2021 1430   GFRNONAA >60  01/12/2021 1430   GFRAA >60 09/02/2019 0249   GFRAA >60 01/27/2019 0811       Component Value Date/Time   WBC 4.5 01/12/2021 1430   RBC 4.32 01/12/2021 1430   HGB 12.8 01/12/2021 1430   HGB 12.9 07/17/2019 0953   HCT 38.5 01/12/2021 1430   HCT 21.9 (L) 12/25/2018 1221   PLT 272 01/12/2021 1430   PLT 286 07/17/2019 0953   MCV 89.1 01/12/2021 1430   MCH 29.6 01/12/2021 1430   MCHC 33.2 01/12/2021 1430   RDW 12.4 01/12/2021 1430   LYMPHSABS 1.4 01/12/2021 1430   MONOABS 0.4 01/12/2021 1430   EOSABS 0.0 01/12/2021 1430   BASOSABS 0.0 01/12/2021 1430    No results found for: "POCLITH", "LITHIUM"   No results found for: "PHENYTOIN", "PHENOBARB", "VALPROATE", "CBMZ"   .res Assessment: Plan:    Recurrent major depression resistant to treatment (HCC)  Generalized anxiety disorder  Attention deficit hyperactivity disorder (ADHD), predominantly inattentive type  Insomnia due to mental condition  Alcohol use disorder, moderate, in sustained remission, dependence (HCC)   She has treatment resistant major depression ongoing with 95% better with Spravato. Previously  discussed some of her  abnormal behaviors last year leading to this depressive episode getting worse which she says were associated with heavy use of delta 8 and not a manic episode.  She realizes now that that was not good for her.  She stopped all use of other drugs including those available over-the-counter such as delta 8 or any other THC related products.  She is no longer having any of those types of behaviors and instead is depressed.  Depression nearly resolved with pramipexole TID and relapsed when out of Auvelity. Anhedonia resolved.  Depression resolved recently after increasing pramipexole 0.5 mg TID, brief relapse when out of Auvelity Consider switch to ER to reduce sleepiness  Patient was administered Spravato 84 mg intranasally today.  The patient experienced the typical dissociation which gradually  resolved over the 2-hour period of observation.  There were no complications.  Specifically the patient did not have nausea or vomiting or headache.  Blood pressures monitored at the 40-minute and 2-hour follow-up intervals.  Borderline high.  By the time the 2-hour observation period was met the patient was alert and oriented and able to exit without assistance.  Patient feels the Spravato administration is helpful for the  treatment resistant depression and would like to continue the treatment.  See nursing note for further details.She wants to continue Spravato. We discussed discussed the side effects in detail as well as the protocol required to receive Spravato.    has failed all major categories of antidepressants MAO inhibitors which have not been tried.  She is tolerating Auvelity.  Continue 1  BID bc more depressed without it dramatically.  Relapsed witout it.  Worse with less pramipexole. Increased to 0.5 mg TID on about end of Feb 2024 and markedly better Dosing range off label for depression ranges from 1-5 mg daily.  Disc risk compulsive impulsive behavior and mania.    Started Spravato 84 mg twice weekly on 03/16/2022.  Wants to continue weekly and feels she needs it.   Disc risk relapse if less than weekly and she doesn't seem to do well with less than weekily.  Adderall  XR 20 mg AM   Ativan 1 mg 3 times daily as needed anxiety but try to cut it back.  She still feels she needs it. Is not ideal to use benzodiazepine with stimulant but because of the severity of her symptoms it has been necessary.  Hope to eventually eliminate the benzodiazepine.  Encourage her to embrace this goal.    Continue clonidine 0.2 mg BID off label for anxiety and helps BP partially. BP is better controlled and more consistent.Rec keep track of BP and discuss with PCP. It has been up and down.  Sometimes needs extra clonidine before Spravato.  Better lately.  Discussed potential benefits, risks, and side  effects of stimulants with patient to include increased heart rate, palpitations, insomnia, increased anxiety, increased irritability, or decreased appetite.  Instructed patient to contact office if experiencing any significant tolerability issues. She wants to return to usual dose of Adderall for ADD bc of mor poor cognitive function with reduction.  Also discussed that depression will impair cognitive function.  Disc risk polypharmacy.  Relapsed after running out of Auvelity so needs clearly it and pramipexole.   She continues walkly daily with benefit.  Has Maintained sobriety.    No med changes indicated today: Continue Adderall XR 20 mg every morning Continue Auvelity 1 tablet twice daily,  continue clonidine 0.2 mg twice daily for anxiety and blood pressure. Continue lorazepam 1 mg 3 times daily she has been unable to cut back on the dose due to anxiety. Continue pramipexole 0.5 mg 3 times daily Continue trazodone 50 mg tablets 1-2 nightly as needed for sleep.  FU with Spravato weekly   Meredith Staggers, MD, DFAPA     Please see After Visit Summary for patient specific instructions.  Future Appointments  Date Time Provider Department Center  07/02/2023  9:30 AM Cottle, Steva Ready., MD [facility] None  07/02/2023  9:30 AM CP-NURSE [facility] None                        No orders of the defined types were placed in this encounter.      -------------------------------

## 2023-06-25 NOTE — Progress Notes (Signed)
NURSES NOTE:   Patient arrived for her weekly 84mg  Spravato treatment. Pt is being treated for Treatment Resistant Depression this is #72 treatment, pt will be receiving 84 mg which will continue to be her maintenance dose, she receives weekly treatments.  Patient arrived and taken to treatment room. Confirmed she had a ride home which is her husband would be coming back to pick up pt when done and sometimes she needs to use [facility] if he is unable to pick her up. Pt's Spravato is ordered through [employer] and delivered to office, all Spravato medication is stored at doctors office per REMS/FDA guidelines. The medication is required to be locked behind two doors per FDA/REMS Protocol. Medication is also disposed of properly per regulations. Pt's prior authorization for Spravato 84 mg was renewed with an approval through 07/17/2023 with [facility]. All treatments with vital signs documented with Spravato REMS per protocol of being a treatment center.    Pt's Mom has continued to decline and they had to discuss a skilled nursing facility. Pt had a lot going on today and really couldn't settle down and relax, she was up until 4 am painting.  Began taking patient's vital signs at 10:00 AM 123/82, pulse 80. Pulse Ox 99%. Gave patient first dose 28 mg nasal spray, each nasal spray administered in each nostril as directed and waited 5 minutes between the second and third dose. All 3 doses given pt did not complain of any nausea/vomiting, given a cup of water due to the taste after the administration of Spravato.  She listens to [employer] with spa or relaxing music. Pt is restless today, says she has so much work to get done, she was looking at her phone when I walked in to get her vital signs. Advised her to put the phone down and relax the rest of the time left or the medication wasn't going to do what it needed to do. She agreed. Checked 40 minute vitals at 10:50 AM, 146/96, pulse 94, Pulse Ox 92%. Explained she  would be monitored for a total time of 120 minutes. Discharge vitals were taken at 12:00 PM 137/91 P 66, 99% Pulse Ox. Pt did admit she forgot to take her B/P medications this morning prior to coming since she was running late. Dr. Jennelle Human met with pt and discussed her treatment. Recommend she go home and sleep or just relax on the couch. No driving, no intense activities. Verbalized understanding. Nurse was with pt a total of 70 minutes for clinical. Pt will be scheduled, next Tuesday, August 20th.   LOT [id number] EXP APR 2027

## 2023-06-30 NOTE — Progress Notes (Signed)
NURSES NOTE:   Patient arrived for her weekly 84mg  Spravato treatment. Pt is being treated for Treatment Resistant Depression, pt will be receiving 84 mg which will continue to be her maintenance dose, she receives weekly treatments.  Pt is considering TMS. Patient arrived and taken to treatment room. Confirmed she had a ride home which is her husband would be coming back to pick up pt when done and sometimes she needs to use [facility] if he is unable to pick her up. Pt's Spravato is ordered through [employer] and delivered to office, all Spravato medication is stored at doctors office per REMS/FDA guidelines. The medication is required to be locked behind two doors per FDA/REMS Protocol. Medication is also disposed of properly per regulations.   Began taking patient's vital signs at 1:10 PM 127/77, pulse 71. Pulse Ox 99%. Gave patient first dose 28 mg nasal spray, each nasal spray administered in each nostril as directed and waited 5 minutes between the second and third dose. All 3 doses given pt did not complain of any nausea/vomiting, given a cup of water due to the taste after the administration of Spravato.  She listens to [employer] with spa or relaxing music.  Checked 40 minute vitals at 2:15 PM, 131/93, pulse 80, Pulse Ox 92%. Explained she would be monitored for a total time of 120 minutes. Discharge vitals were taken at 3:10 PM 90/51 P 72, 99% Pulse Ox. Dr. Jennelle Human met with pt today and discussed her medication. I walked pt to elevator, where her husband met her for the ride home. Recommend she go home and sleep or just relax on the couch. No driving, no intense activities. Verbalized understanding. Nurse was with pt a total of 70 minutes for clinical. Pt is scheduled next March 5th, 2024. Pt instructed to call office with any problems or questions.      LOT [id number] EXP FEB 2027

## 2023-07-01 ENCOUNTER — Encounter: Payer: Self-pay | Admitting: Psychiatry

## 2023-07-02 ENCOUNTER — Encounter: Payer: 59 | Admitting: Psychiatry

## 2023-07-03 ENCOUNTER — Other Ambulatory Visit: Payer: Self-pay | Admitting: Psychiatry

## 2023-07-04 ENCOUNTER — Encounter: Payer: Self-pay | Admitting: Psychiatry

## 2023-07-04 ENCOUNTER — Ambulatory Visit: Payer: 59

## 2023-07-04 ENCOUNTER — Ambulatory Visit: Payer: 59 | Admitting: Psychiatry

## 2023-07-04 VITALS — BP 112/76 | HR 50

## 2023-07-04 DIAGNOSIS — F339 Major depressive disorder, recurrent, unspecified: Secondary | ICD-10-CM

## 2023-07-04 DIAGNOSIS — F900 Attention-deficit hyperactivity disorder, predominantly inattentive type: Secondary | ICD-10-CM | POA: Diagnosis not present

## 2023-07-04 DIAGNOSIS — F411 Generalized anxiety disorder: Secondary | ICD-10-CM

## 2023-07-04 DIAGNOSIS — F5105 Insomnia due to other mental disorder: Secondary | ICD-10-CM | POA: Diagnosis not present

## 2023-07-04 NOTE — Progress Notes (Signed)
NURSES NOTE:   Patient arrived for her weekly 84mg  Spravato treatment. Pt is being treated for Treatment Resistant Depression this is #73 treatment, pt will be receiving 84 mg which will continue to be her maintenance dose, she receives weekly treatments.  Patient arrived and taken to treatment room. Confirmed she had a ride home which is her husband would be coming back to pick up pt when done and sometimes she needs to use [facility] if he is unable to pick her up. Pt's Spravato is ordered through [employer] and delivered to office, all Spravato medication is stored at doctors office per REMS/FDA guidelines. The medication is required to be locked behind two doors per FDA/REMS Protocol. Medication is also disposed of properly per regulations. Pt's prior authorization for Spravato 84 mg was renewed with an approval through 07/17/2023 with [facility]. All treatments with vital signs documented with Spravato REMS per protocol of being a treatment center.    Began taking patient's vital signs at 10:10 AM 148/76, pulse 51. Pulse Ox 99%. Gave patient first dose 28 mg nasal spray, each nasal spray administered in each nostril as directed and waited 5 minutes between the second and third dose. All 3 doses given pt did not complain of any nausea/vomiting, given a cup of water due to the taste after the administration of Spravato.  She listens to music on her phone today. Pt was in the plant room today and reports having such an awesome treatment. Checked 40 minute vitals at 11:00 AM, 108/78, pulse 51, Pulse Ox 92%. Explained she would be monitored for a total time of 120 minutes. Discharge vitals were taken at 12:00 PM 112/76 P 50, 99% Pulse Ox.  Dr. Jennelle Human met with pt and discussed her treatment. Recommend she go home and sleep or just relax on the couch. No driving, no intense activities. Verbalized understanding. Nurse was with pt a total of 70 minutes for clinical. Pt will be scheduled, next Tuesday, August  27th.   LOT [id number] EXP [id number]

## 2023-07-04 NOTE — Progress Notes (Signed)
Laura Chang [id number] 07/03/1956 67 y.o.  Subjective:   Patient ID:  Laura Chang is a 67 y.o. (DOB April 28, 1956) female.  Chief Complaint:  Chief Complaint  Patient presents with   Follow-up   Depression   Anxiety   ADD      Laura Chang presents to the office today for follow-up of depression and anxiety and ADD.  seen November 12, 2019.  Melted down in 2020.  Went to [facility] in July.  No withdrawal.  1 drink since then.  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 She was on no stimulant and no SSRI but was taking Strattera and Wellbutrin.  The following changes were made. Stop Strattera. OK restart stimulant bc severe ADD Restart Adderall 1 daily for a few days and if tolerated then restart 1 twice daily. If not tolerated reduce the dosage if needed. May need to stop Wellbutrin if not tolerating the stimulant.  Yes.  DC Wellbutrin Restart Prozac 20 mg daily.  February 2021 appointment with the following noted: Completed grant proposal.  Couldn't doit without Adderall.  Sold a bunch of work.   Adderall XR lasts about 3 pm.  Strength seems about right.  BP been OK.  Not jittery.   Stopped Wellbutrin but had no SE. Mood drastically better with grant proposal and back on fluoxetine.  Less depressed and lethargic.  No anxiety.  Cut back on coffee. Started back with devotions and stronger faith. Plan: Continue Prozac 20 mg daily. May have to increase the dose at some point in the future given that she usually was taking higher dosages but she is getting good response at this time. Restart Wellbutrin off label for ADD since can't get 2 ADDERALL daily. 150 mg daily then 300 mg daily. She can adjust the dose between 150 mg and 300 mg daily to get the optimal effect.   05/11/2020 appointment with the following noted: Has been inconsistent with Prozac and Wellbutrin. Not sure of the effect of Wellbutrin. Biggest deterrent in work is anxiety.   Some of the work is conceptual and difficult at times.  Can feel she's not up to a project at times.  Overall is OK but would like a steadier benefit from stimulant.  Exhausted from managing concentration and keeping up with things from the day.  Loses things.  Not good keeping up with schedule. Overall productive and emotionally OK. Can feel Adderall wear off. Mood is better in summer and worse in the winter.   Laura Chang died in 21-Oct-2023 and that is a loss. No SE Wellbutrin. Still attends AA meetings.  Real benefit from [facility] last year. Recognizes effect of anemia on ADD and mood.  Had iron infusions last winter. Plan:  Wellbutrin off label for ADD since can't get 2 ADDERALL daily. 150 mg daily then 300 mg daily.  01/24/2021 appointment with following noted: Doing a program called [employer] mindfulness app since Xmas.  CBT app helped the depression.  App helped her focus better.  Lost sign weight. Writing a lot. Before Xmas felt depressed and started negative thinking worse, self denigrating. Not drinking. More isolated.   Recognizes mo is narcissist.    Didn't tell anyone she was born until 3 mos later.  M aloof and uninterested in pt.  Lied about her birthday.  Mo lack of affection even with pt's kids. Going to AA for a year and it helped her to quit drinking. Also misses kids being gone  with a hole also. Plan: No med changes  05/04/2021 appointment with the following noted: Therapist Laura Chang thinks she's manic. Lost weight to 144#.   States she is still sleeping okay.  Admits she is hyper and recognizes that she is likely manic.  She feels great, euphoric with an increased sense of spiritual connectedness to God.  She has racing thoughts and talks fast and talks a lot and this is noted by her husband.  He thinks she is a bit hyper.  She has been able to maintain sobriety although she will have 1 glass of wine on special occasions but does not drink by herself.  She is not drinking to  excess.  She denies any dangerous impulsivity.  She is clearly not depressed and not particularly anxious.  She has no concerns about her medication and she has been compliant.  06/16/21 appt noted: So much better.  Going through a lot but the manic thing happened on top of it.  So much slower.  Didn't feel like losing anything with risperidone.  Likes the Adderalll at 10 mg. Some drowsiness in the AM and very drowsy from risperidone 2 mg HS. Prayer life is better. Handling stress better. Less depressed with risperidone. Still likes trazodone. Sleeps well. Plan: Reduce Prozac to 10 mg daily.  Consider stopping it because it can feel the mania however she is reluctant to do that because she fears relapse of depression. Reduce risperidone to 1.5 mg nightly due to side effects.  Discussed risk of worsening mania.  07/25/2021 appointment with the following noted: Misses the Adderall and hard to function without it. Depressed now. Heavy chest.  Anxious and guilty.  Body feels heavy.   Hates Wellbutrin.   Plan: Increase fluoxetine to 20 mg daily Add Abilify 1/2 of 15 mg tablet daily Wean wellbutrin by 1 tablet each week  bc she feels it is not helpful and DT polypharmacy Reduce risperidone to 1 daily for 1 week and stop it. Disc risk of mania. Increase Adderall to XR 20 mg AM  08/08/21 Much less depressed and starting to feel normal I feel a lot better. No SE.  Speech normal off risperidone. Sleeping OK on trazaodone and enough.   Noticed benefit from Adderall again. Plan: continue fluoxetine to 20 mg daily Continue Abilify 1/2 of 15 mg tablet daily for depression and mania continue Adderall to XR 20 mg AM  10/10/2021 phone call: Pt stated she feels like the Abilify should be decreased to 5mg .She said she is depressed but rational and not suicidal.She has an appt Monday and can wait until then if you prefer. MD response: Reduce the Abilify to 7.5 mg every other day.  We will meet on 10/16/2021  and decide what to do from there.  10/16/2021 appointment with the following noted: More depressed.  Most depressed I've ever been.  Just numb.  Sense of grief.   Thinks the manic episode was unlike anything else she ever had.  Doesn't want to medicate against it.  Don't enjoy people.  Easily overwhelmed.  Had some death thoughts but not suicidal.  Has been functional.  Feels better today after reducing Abilify to every other day but she is only been doing that for 3 days. A/P: Episode of post manic depression was explained. continue fluoxetine to 20 mg daily Hold Abilify for 1 week then resume Abilify 1/2 of 15 mg tablet every other day for depression and mania continue Adderall to XR 20 mg AM  10/27/2021  appointment with the following noted: I'm doing so much better.  Handling the depression better. Better self talk and spiritual focus has helped.   Dep 6/10 manifesting as anxiety with low confidence.   Laura Chang died 2  years ago and M 67 yo and is dependent . She is working hard to feel better but still feels depressed.  She almost feels like she has a little more anxiety since restarting Abilify every other day. Plan: continue fluoxetine to 20 mg daily DC Abilify .  Vrayalar 1.5 mg QOD to try to get rid of depression ASAP. continue Adderall to XR 20 mg AM  11/10/2021 appointment with the following noted: Busy with Xmas and it was fun with family but then a big let down.  Did well with it.  Functioned well with it.  Working hard on things with depression.  Not shutting down. Not sure but feels better today but yesterday was hard.  Difficulty dealing with mother.  She won't do anything to help herself.  Yesterday with her all day.  Won't do PT and has isolated herself.    Lack of confidence.   No SE with Vraylar.  11/24/21 urgent appointment appt noted: More and more depressed.   So anxious and doesn't want to be alone but can do so. No appetite. Hurts inside. Has had some fleeting suicidal  thoughts but would not act on them.  Tolerating meds. Has been consistent with Vraylar 1.5 mg every other day, fluoxetine 20 mg daily Plan: Increase Vraylar to 1.5 mg daily Change Prozac to Trintellix 10 mg daily. Discussed side effects of each continue Adderall to XR 20 mg AM  12/27/2021 appointment with the following noted: Not OK.  I feel less depressed but feels bat shit. Not sleeping well.  Extremely anxious. Off and on sleep. 3-4 hours of sleep.   Still having daily SI.  But also become obvious has so much to do.  Overwhelmed by tasks.   Needs anxiety meds to just function. Not more motivated.  Walked yesterday.   Feels afraid like in trouble but not irritable or angry. DC DT agitation Vraylar to 1.5 mg daily Change Prozac to Trintellix 10 mg daily. Hold Adderall to XR 20 mg AM Clonidine 0.1 1/2 tablet twice daily for 2 days and if needed for anxiety and sleep increase to 1 twice daily Ok temporary Ativan 1 mg 3 times daily as needed anxiety  01/05/22 appt noted: Off fluoxetine and  Trintellix.  Only on Ativan, trazodone and Adderall XR 20 plus added clonidine 0.1 mg BID Didn't think she needed to start Trintellix. Not taking Ativan.   Didn't like herself last week. Feels some better today. Wonders if the manic sx Not agitated.  Anxiety kind of calmed down.  A lot to be anxious about situationally.  $ stress. Concerns about downers with meds. Can't access normal personality. ? Lethargy and inability to talk as sE. Plan: Latuda 20-40 mg daily with food. Adderall to XR 20 mg AM Clonidine 0.1 1/2 tablet twice daily  reduce dose to be sure no SE Ok temporary Ativan 1 mg 3 times daily as needed anxiety  01/19/22 appt noted: Taking Latuda 20 mg daily.  Took 40 mg once and felt anxious and  SI Still depressed and not very reactive Anxiety mainly about the depression and fears of the future. She wants to revisit manic sx and thinks it was maybe bc taking delta 8 bc was taking a lot  of it so still  doesn't think she's classic bipolar. She wants to only take Prozac bc thinks Latuda is perpetuating depression. Says the delta 8 was very psychaedelic.  When not taking it was not manic.  Sleeping ok again.  Plan: Per her request DC Latuda 20-40 mg daily with food. She wants to continue Prozac alone AMA  Adderall to XR 20 mg AM Clonidine 0.1 1/2 tablet twice daily  reduce dose to be sure no SE Ok temporary Ativan 1 mg 3 times daily as needed anxiety  01/23/2022 phone call complaining of increased anxiety since stopping Latuda.  She will try increasing clonidine.  01/26/2022 phone call not feeling well and wanted to restart the Vraylar.  However notes indicate that had made her agitated therefore she was encouraged to pick up samples of Rexulti 1 mg and start that instead.  02/06/2022 phone call: Stating she felt the Rexulti was helping with depression but she was not sleeping well and obsessing over things.  She was encouraged to increase Rexulti to 2 mg daily and increase trazodone for sleep.  02/09/2022 appointment with the following noted: This was an urgent work in appointment No sleep last night with trazodone 100 mg HS Nothing really better depression or anxiety. Ruminating negative anxious thoughts. Did not tolerate Rexulti because it was causing insomnia.  Does not think it helped depression.  Lacks emotion that she should have.  Lacks her usual personality.  Some hopeless thoughts.  Some death thoughts.  Some suicidal thoughts without plan or intent Plan: DC Rexulti and Prozac & DC trazodone Adderall to XR 20 mg AM Clonidine 0.1 1/tablet twice daily  reduce dose to be sure no SE Ok temporary Ativan 1 mg 3 times daily as needed anxiety Start Seroquel XR 150 mg nightly  03/02/2022 appointment: Angelique Blonder called back a few days after starting Seroquel stating it was making her more anxious and more depressed.  This seemed unlikely as this medicine rarely ever causes anxiety.   She stopped the medication waited 3 days and called back still had anxiety and depression but thought perhaps the anxiety was a little better.  She did not want to take the Seroquel. She knew about the option of Spravato and wanted to pursue that. Now questions whether to return to Seroquel while waiting to start Spravato bc feels just as bad without it and knows she didn't give it enough time to work.   MADRS 46  ECT-MADRS    Flowsheet Row Clinical Support from 01/08/2023 in [facility] Clinical Support from 08/06/2022 in [facility] Clinical Support from 07/04/2022 in [facility] Clinical Support from 05/21/2022 in [facility] Office Visit from 03/02/2022 in [facility]  MADRS Total Score 21 29 15 27  46      03/14/22 appt noted: Pt received Spravato 56 mg first dose today with some dissociative sx which were not severe.  She was anxious prior to the administration and felt better after receiving lorazepam 1 mg.  No NV, or HA. Wants to continue Spravato. Ongoing depression and desperate to feel better.  I'm not myself DT deprsssion which is most severe in recent history.  Anhedonia.  Low motivation.  Social avoidance. Continues to think all recent med trials are making her worse.  Sleep ok with Seroquel.  03/16/22 appt noted: Received Spravato 84 mg for the first time.  some dissociative sx which were not severe.  She was anxious prior to  the administration and felt better after receiving lorazepam 1 mg.  No NV, or HA. Wants to continue Spravato.   Does not feel any better or different since the last appt.  Ongoing depression.  Ongoing depression and desperate to feel better.  I'm not myself DT deprsssion which is most severe in recent history.  Anhedonia.  Low motivation.  Social avoidance. Continues to think all recent med trials are making her worse.  Sleep  ok with Seroquel.  Does not want to continue Seroquel for TRD.  03/20/2022 appointment noted: Came for Spravato administration today.  However blood pressure was significantly elevated approximately 180/115.  She was given lorazepam 1 mg and clonidine 0.2 mg to try to get it down. She states she regretted stopping the Seroquel XR 300 mg tablets.  She now realizes it was helpful.  She did not sleep much at all last night.  She did not take the Adderall this morning. 2 to 3 hours after arrival blood pressure was still elevated at  170/110, 62 pulse.  For Spravato administration was canceled for today.  She admits to being anxious and depressed.  She is not suicidal.  She is highly motivated to receive the Spravato.  We discussed getting it tomorrow.  03/22/2022 appointment noted: Patient's blood pressure was never stable enough yesterday in order to get her in for Spravato administration.  She was encouraged to see her primary care doctor.  It is better today.  03/26/2022 appointment with the following noted: Blood pressure was better.  Saw her primary care doctor who started on oral Bystolic 2.5 mg daily. Received Spravato 84 mg today as scheduled.  Tolerated it well without nausea or vomiting headache or chest pain or palpitations.  Her blood pressure was borderline but manageable. She remains depressed and anxious.  She is ambivalent about the medicine and desperate to get to feel better.  Continues to have anhedonia and low energy and low motivation and reduced ability to do things.  Less social.  Not suicidal.  03/28/22 appt noted: Received Spravato 84 mg today as scheduled.  Tolerated it well without nausea or vomiting headache or chest pain or palpitations.  Her blood pressure was borderline but manageable. Has not seen any improvement so far.  Tolerating Seroquel.  Inconsistent with Bystolic and BP has been borderline high. Still depressed and anxious and anhedonia.  Low motivation, energy,  productivity. Taking quetiapine and tolerating XR 300 mg nightly.  04/04/22 appt noted: Received Spravato 84 mg today as scheduled.  Tolerated it well without nausea or vomiting headache or chest pain or palpitations.  Her blood pressure was borderline but manageable. Has not seen any improvement so far.  Tolerating Seroquel.   She still tends to think that the medications are making her worse.  She has said this about each of the recent psychiatric medicines including Seroquel.  However her husband thinks she is improved.  She also admits there is some improvement in productivity.  She still feels highly anxious.  She still does not enjoy things as normal.  She still feels desperate to improve as soon as possible. Has been taking Seroquel XR since 03/20/2022  04/10/22 appt noted: Received Spravato 84 mg today as scheduled.  Tolerated it well without nausea or vomiting headache or chest pain or palpitations.  Her blood pressure was borderline but manageable. Has not seen any improvement so far.  Tolerating Seroquel.  Doesn't like Seroquel bc she thinks it flattens here. 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  Tolerated it well without nausea or vomiting headache or chest pain or palpitations.  Her blood pressure was borderline but manageable. Has not seen any improvement so far.  Tolerating Seroquel.  Doesn't like Seroquel bc she thinks it flattens her. Received Spravato 84 mg today as scheduled.  Tolerated it well without nausea or vomiting headache or chest pain or palpitations.  Her blood pressure was borderline but manageable. Has not seen any improvement so far.  Tolerating Seroquel.  Doesn't like Seroquel bc she thinks it flattens here.  We discussed her ambivalence about it. She is starting Auvelity and has tolerated it  the last 2 days without side effect.  She still does not feel like herself and feels flat and not enjoying things with suppressed expressed emotion  04/17/2022 appointment with the following noted: Received Spravato 84 mg today as scheduled.  Tolerated it well without nausea or vomiting headache or chest pain or palpitations.  Her blood pressure was borderline but manageable. Has not seen any improvement so far.  Tolerating Seroquel.  Doesn't like Seroquel bc she thinks it flattens her. She has been tolerating the Auvelity 1 in the morning without side effects for about a week.  She has not noticed significant improvement so far.  She still feels depressed and flat and not herself.  Other people notice that she is flat emotionally.  She is not suicidal.  She does feel discouraged that she is not getting better yet.  04/19/2022 appointment noted: Has increased Auvelity to 1 twice daily for 2 days, continues quetiapine XR 300 mg nightly, clonidine 0.3 mg twice daily, lorazepam 1 mg twice daily for anxiety and Adderall XR 20 mg in the morning. No obious SE but she still thinks quetiapine XR is making her feel down.  But not sedated Received Spravato 84 mg today as scheduled.  Tolerated it well without nausea or vomiting headache or chest pain or palpitations.  Her blood pressure was borderline but manageable. She still feels quite anxious and feels it necessary to take both the clonidine and lorazepam twice a day to manage her anxiety.  She has been consistently down and flat and not herself until yesterday afternoon she noted an improvement in mood and feeling much more like herself with her normal personality reemerging.  She was quite depressed in the morning with very dark negative thoughts.  She did not have those dark negative thoughts this morning.  She had a lot of questions about medication and when she was expecting to be improved and why she has not shown improvement up to now.  04/23/22 appt  noted: Has increased Auvelity to 1 twice daily for 1 week, continues quetiapine XR 300 mg nightly, clonidine 0.3 mg twice daily, lorazepam 1 mg twice daily for anxiety and Adderall XR 20 mg in the morning. No obious SE but she still thinks quetiapine XR is making her feel down.  But not sedated Received Spravato 84 mg today as scheduled.  Tolerated it well without nausea or vomiting headache or chest pain or palpitations.  She is still depressed but admits better function and is able to enjoy social interactions. Tolerating meds.  Would like to feel better for sure. Not herself.  Flat. Plan increase Auvelity to 1 tab BID as planned and reduce Quetiapine to 1/2 of ER 300 mg  bc NR for depression.  04/25/2022 appointment with the following noted: clonidine 0.3 mg twice daily, lorazepam 1 mg twice daily for anxiety and Adderall XR 20 mg in the morning. Seroquel XR 300 HS No obious SE but she still thinks quetiapine XR is making her feel down.  But not sedated Received Spravato 84 mg today as scheduled.  Tolerated it well without nausea or vomiting headache or chest pain or palpitations.  Called yesterday with more anxiety.  Had increased Auvelity for 1 day and reduced Seroquel XR for 1 day.  Felt restless and fearful  05/01/2022 appointment noted: clonidine 0.3 mg twice daily, lorazepam 1 mg twice daily for anxiety and Adderall XR 20 mg in the morning. Seroquel XR 150 HS, Auvelity 1 BID Received Spravato 84 mg today as scheduled.  Tolerated it well without nausea or vomiting headache or chest pain or palpitations.  Nurse has noted patient has called multiple times sometimes asking the same question repeatedly.  It is unclear whether she is truly forgetful or is just anxious seeking reassurance. Patient acknowledges ongoing depression as well as some anxiety but states she has felt a little better in the last couple of days.  She has reduced the Seroquel to 150 mg at night and has increased Auvelity to 1  twice daily but only for 1 day.  So far she seems to be tolerating it.  05/03/22 appt noted: clonidine 0.2 mg twice daily, lorazepam 1 mg twice daily for anxiety and Adderall XR 20 mg in the morning. Seroquel XR 150 HS, Auvelity 1 BID BP high this am about 170/100 and received extra clonidine 0.2 mg and came to receive Spravato.  Not dizzy, no SOB, nor CP but BP is still high Could not receive Spravato today bc BP high and pulse low at 30 ppm. Still depressed and anxious. Plan: continue trial Auvelity with Spravato She needs to get BP and pulse managed  05/08/22 TC: RTC  H Michael NA and mailbox full.  Could not leave message.  Pt  -  talked to she and H on speaker. H worried over wife.  Vacant stare.  Slurs words at times.  Not smiling. Reduced enjoyment.  Depression.  Withdrawn from usual activities.  Some irritability.  Anxious. Disc her concerns meds are making her worse.  Extensive discussion about her treatment resistant status.  There is a consistent pattern of not taking the medicines long enough to get benefit because she believes the meds are making her worse.  However the symptoms she describes as side effects are exactly the same symptoms that she had prior to taking the medication RX for  the depression.  So it is not clear that these are actual side effects. This is true about the 2 most recent meds including Seroquel and Auvelity.  Recommend psychiatric consultation in hopes of improving her comfort level with taking prescribed medications for a sufficient length of time to provide benefit. Extensive discussion about ECT is the treatment of choice for treatment resistant depression.  Spravato may work if she can comply with consistency.  There are medication options but they take longer to work.   Plan:  Reduce clonidine to 0.1 mg BID DT bradycardia.  Talk with PCP about BP and low pulse problems which are interfering with her consistent compliance with Spravato.   Limit lorazepam to  3 -4  mg daily max. Excess use is the cause of slurring speech.  She must stop excess use or will have to stop the med. Stop Auvelity per  her request.  But she has only been on the full dose for a little over a week and clearly has not had time to get benefit from it.  She thinks maybe it is making her more anxious. Reduce Seroquel from 150XR to 50 -100 mg at night IR.  She couldn't sleep when stopped it completely. Will not start new antidepressant until her SE issues are resolved or not. Get second psych opinion from Wellington Hampshire MD or another psychiatrist.  H's sister is therapist in [place], MD, [facility]  05/16/2022 appointment with the following noted: Received Spravato 84 mg today as scheduled.  Tolerated it well without nausea or vomiting headache or chest pain or palpitations.  She stopped Auvelity as discussed last week. On her own, without physician input, she restarted Wellbutrin XL 450 mg every morning today.  She had taken it in the past.  She feels jittery and anxious. She feels less depressed than she did last week.  But she is still depressed without her usual range of affect.  She still is less social and less motivated than normal. Her primary care doctor increased the dose of losartan Plan: Stop Seroquel Reduce Wellbutrin XL to 300 mg every morning.  Starting the dose at 450 every morning is likely causing side effects of jitteriness and it should not be started at that have a dosage. Recommend she not change meds on her own without MDM put  05/23/2022 appointment with the following noted: Received Spravato 84 mg today as scheduled.  Tolerated it well without nausea or vomiting headache or chest pain or palpitations.  Has not dropped seroquel XR 300 mg 1/2 tablet nightly bc couldn't sleep without it. Has not tried lower dose quetiapine 50 mg HS Still feels depressed.   BP is better managed so far, just saw PCP.  BP is better today and infact is low today. Dropped  clonidine as directed from 0.3 mg BID bc inadequate control of BP to 0.2 mg BID.  However she wants to increase it back to 0.3 mg twice daily because she feels it helped her anxiety better.  Wonders about increasing Wellbutrin for depression.  However she has only been on 300 mg a day for a week.  She was on 450 mg daily in the past.  06/06/22 appt noted: Received Spravato 84 mg today as scheduled.  Tolerated it well without nausea or vomiting headache or chest pain or palpitations.  She is still depressed and anxious.  She wants to try to stop the Seroquel but cannot sleep without some of it.  She is taking lorazepam 1 mg 4 times daily and still having a lot of anxiety.  She wants to increase clonidine back to 0.3 mg twice daily.  She hopes for more improvement She recently went for a second psychiatric opinion as suggested the results of that are pending.  06/11/22 appt noted: Received Spravato 84 mg today as scheduled.  Tolerated it well without nausea or vomiting headache or chest pain or palpitations.  She is still depressed and anxious. Without much change.  Still hopeless, anhedonia, reduced inteterest and motivation.  Tolerating meds. Disc concerns Spravato is not hleping much. Plan: stop Seroquel and start olanzapine 10 mg HS for TRD and anxiety.  06/13/2022 appointment noted: Received Spravato 84 mg today as scheduled.  Tolerated it well without nausea or vomiting headache or chest pain or palpitations.  She is still depressed and anxious. Without much change.  Still hopeless, anhedonia, reduced inteterest  and motivation.  Tolerating meds. Disc concerns Spravato is not helping much as hoped but is improving a bit in the last week. Tolerating meds. Continues Wellbutrin XL 450 AM, tolerating recently started olanzapine  10 mg HS. Sleep is good.   Pending appt with TMS consult.  06/18/22 appt noted: Received Spravato 84 mg today as scheduled.  Tolerated it well without nausea or vomiting  headache or chest pain or palpitations.  Tolerating meds. Continues Wellbutrin XL 450 AM, tolerating recently started olanzapine  10 mg HS. Continues Adderall XR 20 amd and has tried to reduce lorazepam to 1mg  TID Sleep is good.   Pending appt with TMS consult. Depression is a little bit better in the last week with a little improvement in emotional expression and interest.  She is pushing herself to be more active.  Her daughter thought she was a little better than she has been.  However she is still depressed and still not her normal self with anhedonia and reduced emotional expressiveness.  06/20/22 appt noted: Received Spravato 84 mg today as scheduled.  Tolerated it well without nausea or vomiting headache or chest pain or palpitations.  Tolerating meds with a little sleepiness. Continues Wellbutrin XL 450 AM, tolerating recently started olanzapine  10 mg HS. Continues Adderall XR 20 amd and has tried to reduce lorazepam to 1mg  TID Sleep is good.   Mood is improving.  Better funciton.  Anxiety is better with olanzapine. Still not herself and depression not gone with some anhedonia and social avoidance and feeling overwhelmed.  8/14 2023 received Spravato 84 mg 06/27/2022 received Spravato Spravato 84 mg 07/02/2022 received Spravato 84 mg 07/04/2022 received Spravato 84 mg  07/09/2022 appointment noted: Received Spravato 84 mg today as scheduled.  Tolerated it well without nausea or vomiting headache or chest pain or palpitations.  Expected dissociation and feels less depressed with resolution of negative emotions immediately after Spravato and then depression, anxiety creep back in. Continues meds Adderall XR 20 mg every morning, Wellbutrin XL 450 every morning, clonidine 0.1 mg twice daily, lorazepam 1 mg every 6 hours as needed, olanzapine increased from 7.5 to 10 mg nightly on  Tolerating meds.  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  She does note some increased appetite and is somewhat concerned about that but has not gained significant amounts of weight. She has had the TMS consultation which was initially denied but she knows it can be appealed.  However because she is improving with Spravato plus the other medications now she wants to continue the current treatment plan.  07/18/22 appt noted: Continues meds Adderall XR 20 mg every morning, Wellbutrin XL 450 every morning, clonidine 0.1 mg twice daily, lorazepam 1 mg every 6 hours as needed, olanzapine increased from 7.5 to 10 mg nightly on 07/04/2022. Received Spravato 84 mg today as scheduled.  Tolerated it well without nausea or vomiting headache or chest pain or palpitations.  Expected dissociation and feels less depressed with resolution of negative emotions immediately after Spravato and then depression, anxiety creep back in. Continues meds Adderall XR 20 mg every morning, Wellbutrin XL 450 every morning, clonidine 0.1 mg twice daily, lorazepam 1 mg every 6 hours as needed, olanzapine increased from 7.5 to 10 mg nightly on  Tolerating meds.  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  She does note some increased appetite and is somewhat concerned about that but has not gained significant amounts of weight. She has had the TMS consultation which was initially denied but she knows it can be appealed. She continues to have chronic ambivalence about psychiatric medicines and initially tends to blame her depressive symptoms such as decreased concentration and feeling flat on what ever medicine she currently is taking even though she had the same symptoms before the current medicines were started.  Then after discussion she does admit that her depressive symptoms are improved since adding  olanzapine but still has those residual symptoms noted.  07/23/22 received Spravato 84 mg   07/30/2022 appointment noted: Received Spravato 84 mg today as scheduled.  Tolerated it well without nausea or vomiting headache or chest pain or palpitations.  Expected dissociation and feels less depressed with resolution of negative emotions immediately after Spravato and then depression, anxiety creep back in. Continues meds Adderall XR 20 mg every morning, Wellbutrin XL 450 every morning, clonidine 0.1 mg twice daily, lorazepam 1 mg every 6 hours as needed, olanzapine increased from 7.5 to 10 mg nightly on  She has been inconsistent with olanzapine because she continues to be ambivalent about the medications in general and thinks that perhaps the 10 mg is making her feel blunted.  She continues to feel some depression.  She had a good day this week and but still feels somewhat depressed and persistently anxious. Plan: be consistent with olanzapine 10 mg HS for TRD and longer trial for potential benefit for anxiety.  Has not taken it consistently.  08/06/2022 appointment noted: Received Spravato 84 mg today as scheduled.  Tolerated it well without nausea or vomiting headache or chest pain or palpitations.  Expected dissociation and feels less depressed with resolution of negative emotions immediately after Spravato and then depression, anxiety creep back in. Continues meds Adderall XR 20 mg every morning, Wellbutrin XL 450 every morning, clonidine 0.1 mg twice daily, lorazepam 1 mg every 6 hours as needed, olanzapine i 10 mg nightly  She continues to feel depressed but is about 50% better with Spravato.  She is still not herself.  She still has anhedonia.  She still is not her able to engage socially in the typical ways.  She is not jovial and outgoing like normal.  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  She continues to feel that her personality is dampened down.  There is a question about whether it is related to depression or medication. Plan: continue olanzapine 10 for longer trial for TRD and severe anxiety.  08/13/22 appt noted:  Received Spravato 84 mg today as scheduled.  Tolerated it well without nausea or vomiting headache or chest pain or palpitations.  Expected dissociation and feels less depressed with resolution of negative emotions immediately after Spravato and then depression, anxiety creep back in. Continues meds Adderall XR 20 mg every morning, Wellbutrin XL 450 every morning, clonidine 0.1 mg twice daily, lorazepam 1 mg every 6 hours as needed, olanzapine i 10 mg nightly  She still does not feel herself.  Still struggles with depression and low motivation and reduced social engagement and reduced interest and reduced emotional expression.  She is somewhat better with the medicines plus Spravato.  She still believes the Spravato makes her blunted and  is not sure how much it helps her anxiety.  She can have good days when her family is around and she is engaged.  She still wants to stop the olanzapine. She has apparently continued to take the trazodone despite having been told to stop it when she started olanzapine.  She feels like she needs the trazodone. Plan: DC olanzapine and Start nortriptyline 25 mg nightly and build up to 75 mg nightly and then check blood level.    08/27/2022 appointment noted: Received Spravato 84 mg today as scheduled.  Tolerated it well without nausea or vomiting headache or chest pain or palpitations.  Expected dissociation and feels less depressed with resolution of negative emotions immediately after Spravato and then depression, anxiety creep back in. Continues meds Adderall XR 20 mg every morning, Wellbutrin XL 450 every morning, clonidine 0.1 mg twice daily, lorazepam 1 mg every 6 hours as needed. Stopped olanzapine and started  nortriptyline which she has taken for about a week is 75 mg nightly. So far she is tolerating the nortriptyline well with the exception of some dry mouth and constipation which she is working to manage.  She does not feel substantially better better or different off the olanzapine.  No change in her sleep which is good.  Main concern currently in addition to the residual depression is anxiety which is somewhat situational with pending arch show.  She is worrying about it more than normal.  Says she is having to take lorazepam twice a day where she had been able to keep reduce it prior to this.  She still does not feel like herself with residual depression with less social interest and less of her usual buoyancy in personality.  She is flatter than normal.  Overall she still feels that the Spravato has been helpful at reducing the severity of the depression.  She is not suicidal. She has not heard anything about the TMS appeal as of yet.  09/05/2022 appointment noted: Received Spravato 84 mg today as scheduled.  Tolerated it well without nausea or vomiting headache or chest pain or palpitations.  Expected dissociation and feels less depressed with resolution of negative emotions immediately after Spravato and then depression, anxiety creep back in. Continues meds Adderall XR 20 mg every morning, Wellbutrin XL 450 every morning, clonidine 0.1 mg twice daily, lorazepam 1 mg every 6 hours as needed. Stopped olanzapine and started nortriptyline which she has taken for about 2 week is 75 mg nightly. Initially blood pressure was a little high causing delay in starting Spravato.  She admitted to feeling a little wound up.  She still experiences a little increase in depression if she goes longer than a week in between doses of Spravato.  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  09/10/22 appt noted: Received  Spravato 84 mg today as scheduled.  Tolerated it well without nausea or vomiting headache or chest pain or palpitations.  Expected dissociation gradually resolved over the 2 hour observation period. She feels 50% less depressed with Spravto and wants to continue it.   Continues meds Adderall XR 20 mg every morning, Wellbutrin XL 450 every morning, clonidine 0.1 mg twice daily, lorazepam 1 mg every 6 hours as needed. Has started nortriptyline 75 mg nightly for about 3 weeks. Has not seen a significant difference with the addition of nortriptyline.  Tolerating it pretty well. She continues  to have some degree of anhedonia and significant depression and anxiety.  Her daughters noticed that she is more needy and calls more frequently.  She acknowledges this as well.  She is clearly still not herself. Plan: pramipexole off label and RX 0.25 mg BID  09/17/2022 appointment noted: Received Spravato 84 mg today as scheduled.  Tolerated it well without nausea or vomiting headache or chest pain or palpitations.  Expected dissociation gradually resolved over the 2 hour observation period. She feels 50% less depressed with Spravto and wants to continue it.   Continues meds Adderall XR 20 mg every morning, Wellbutrin XL 450 every morning, clonidine 0.1 mg twice daily, lorazepam 1 mg every 6 hours as needed. Has started nortriptyline 75 mg nightly for about 3 weeks and DT level 176, reduced to 50 mg HS early November. Still the same sx as noted last visit.  Tolerating meds.   Compliant.  Still depressed and family notices.  Has been able to participate in family interactions.  Some post-show let down and has to do detailed work which is hard for her bc ADD.  Sleep and eating well.  Energy OK but not great.  No SI.  Not cried in a year or so.  Clearly less depressed and hopeless than before the Spravato.  09/24/22 appt noted: Received Spravato 84 mg today as scheduled.  Tolerated it well without nausea or vomiting  headache or chest pain or palpitations.  Expected dissociation gradually resolved over the 2 hour observation period. She feels 50% less depressed with Spravto and wants to continue it.   Continues meds Adderall XR 20 mg every morning, Wellbutrin XL 450 every morning, clonidine 0.1 mg twice daily, lorazepam 1 mg every 6 hours as needed. Has started nortriptyline 75 mg nightly for about 3 weeks and DT level 176, reduced to 50 mg HS early November. Still the same sx as noted last visit.  Tolerating meds.   Compliant.  Still depressed and family notices.  Has been able to participate in family interactions.  Some post-show let down and has to do detailed work which is hard for her bc ADD.  Sleep and eating well.  Energy OK but not great.  No SI.  Not cried in a year or so.  Clearly less depressed and hopeless than before the Spravato. Is not making further progress generally.  Stuck with moderate depression  10/02/22 appt noted: Received Spravato 84 mg today as scheduled.  Tolerated it well without nausea or vomiting headache or chest pain or palpitations.  Expected dissociation gradually resolved over the 2 hour observation period. She feels 50% less depressed with Spravto and wants to continue it.   Continues meds Adderall XR 20 mg every morning, Wellbutrin XL 450 every morning, clonidine 0.1 mg twice daily, lorazepam 1 mg every 6 hours as needed. Has started nortriptyline 75 mg nightly for about 3 weeks and DT level 176, reduced to 50 mg HS early November. Still the same sx as noted last visit.  Tolerating meds.   Compliant.  Still depressed and family notices.  Has been able to participate in family interactions.  Some post-show let down and has to do detailed work which is hard for her bc ADD.  Sleep and eating well.  Energy OK but not great.  No SI.  Not cried in a year or so.  Clearly less depressed and hopeless than before the Spravato. Is not making further progress generally.  Stuck with moderate  depression.  Is  able to function pretty normally. Plan: trial pramipexole 0.25 mg BID off label for depression.   10/08/22 appt noted: Received Spravato 84 mg today as scheduled.  Tolerated it well without nausea or vomiting headache or chest pain or palpitations.  Expected dissociation gradually resolved over the 2 hour observation period. She feels 50% less depressed with Spravto and wants to continue it.   Continues meds Adderall XR 20 mg every morning, Wellbutrin XL 450 every morning, clonidine 0.1 mg twice daily, lorazepam 1 mg every 6 hours as needed. Has started nortriptyline 75 mg nightly for about 3 weeks and DT level 176, reduced to 50 mg HS early November. Still the same sx as noted last visit.  Tolerating meds.   Compliant.  Still depressed and family notices.  Has been able to participate in family interactions.  Some post-show let down and has to do detailed work which is hard for her bc ADD.  Sleep and eating well.  Energy OK but not great.  No SI.  Not cried in a year or so.  Clearly less depressed and hopeless than before the Spravato. Is not making further progress generally.  Stuck with moderate depression.  Behaved and felt pretty normally with family over for Thanksgiving. Doesn't see benefit or SE with pramipexole but thinks maybe it makes her worse. Plan:  increase pramipexole augmentation off label to 0.5 mg BID  10/15/2022 appointment noted: Received Spravato 84 mg today as scheduled.  Tolerated it well without nausea or vomiting headache or chest pain or palpitations.  Expected dissociation gradually resolved over the 2 hour observation period. She feels 50% less depressed with Spravto and wants to continue it.   Continues meds Adderall XR 20 mg every morning, Wellbutrin XL 450 every morning, clonidine 0.2 mg twice daily, lorazepam 1 mg every 6 hours as needed.  Trazodone 50 mg tablets 1-2 nightly as needed insomnia Has started nortriptyline 75 mg nightly for about 3 weeks  and DT level 176, reduced to 50 mg HS early November. Recommended increase pramipexole 0.5 mg twice daily off label for treatment resistant depression on 10/08/2022. She feels better motivated more active with pramipexole 0.5 mg twice daily.  She still is depressed but it is better.  We discussed the possibility of going up in the dose but did not change it.  10/22/2022 appointment noted: Received Spravato 84 mg today as scheduled.  Tolerated it well without nausea or vomiting headache or chest pain or palpitations.  Expected dissociation gradually resolved over the 2 hour observation period. She feels 50% less depressed with Spravto and wants to continue it.   Continues meds Adderall XR 20 mg every morning, Wellbutrin XL 450 every morning, clonidine 0.2 mg twice daily, lorazepam 1 mg every 8 hours as needed.  Trazodone 50 mg tablets 1-2 nightly as needed insomnia Has started nortriptyline 75 mg nightly for about 3 weeks and DT level 176, reduced to 50 mg HS early November. pramipexole 0.5 mg twice daily off label for treatment resistant depression on 10/08/2022. She is better motivated than she was.  She is journaling 3 pages a day.  She has started walking and has walked 5 days a week for 50 minutes for the last 2 weeks.  That is significantly helped her mood.  Her mood tends to be better when she interacts with family.  However she still has some periods of depression.  She is tolerating the medications well.  She still feels like her affect and confidence is not  back to normal. Plan:  increase pramipexole augmentation off label to 0.5 mg tID or 0.75 mg BID   10/29/22 appt noted: Received Spravato 84 mg today as scheduled.  Tolerated it well without nausea or vomiting headache or chest pain or palpitations.  Expected dissociation gradually resolved over the 2 hour observation period. She feels 50% less depressed with Spravto and wants to continue it.   Continues meds Adderall XR 20 mg every  morning, Wellbutrin XL 450 every morning, clonidine 0.2 mg twice daily, lorazepam 1 mg every 8 hours as needed.  Trazodone 50 mg tablets 1-2 nightly as needed insomnia Has started nortriptyline 75 mg nightly for about 3 weeks and DT level 176, reduced to 50 mg HS early November. pramipexole increased to 0.75 mg twice daily off label for treatment resistant depression  She has been feeling somewhat better with the increase in pramipexole and is tolerating it well.  She still is easily overwhelmed.  Her affect and mood can improve now when around her family or doing something positive.  She has been able to be more productive. She is tolerating the current medications. She is still considering TMS as an alternative to Spravato.  11/15/2022 appointment noted: Received Spravato 84 mg today as scheduled.  Tolerated it well without nausea or vomiting headache or chest pain or palpitations.  Expected dissociation gradually resolved over the 2 hour observation period. She feels 50% less depressed with Spravto and wants to continue it.   Continues meds Adderall XR 20 mg every morning, Wellbutrin XL 450 every morning, clonidine 0.2 mg twice daily, lorazepam 1 mg every 8 hours as needed.  Trazodone 50 mg tablets 1-2 nightly as needed insomnia Has started nortriptyline 75 mg nightly for about 3 weeks and DT level 176, reduced to 50 mg HS early November. pramipexole increased to 0.75 mg twice daily off label for treatment resistant depression  He has noticed some increase in depression due to the length of time since the last Spravato administration.  She believes Spravato is helping her.  sHe is not suicidal but has felt very blue the last few days. She is tolerating the medication. She is ambivalent about Spravato versus TMS but she is considering TMS. She wants to continue Spravato administration because it is clearly helpful.  11/19/22 appt noted: Received Spravato 84 mg today as scheduled.  Tolerated it well  without nausea or vomiting headache or chest pain or palpitations.  Expected dissociation gradually resolved over the 2 hour observation period. She feels 50% less depressed with Spravto and wants to continue it.   Continues meds Adderall XR 20 mg every morning, Wellbutrin XL 450 every morning, clonidine 0.2 mg twice daily, lorazepam 1 mg every 8 hours as needed.  Trazodone 50 mg tablets 1-2 nightly as needed insomnia Has started nortriptyline 75 mg nightly for about 3 weeks and DT level 176, reduced to 50 mg HS early November. pramipexole increased to 0.75 mg twice daily off label for treatment resistant depression  She has felt better back on Spravato more regularly.  However she still has residual depression esp when alone or when wihtout activity.  Can function when needed.   Does not have her connfidence back. She plans to pursue TMS availability. Have discussed retrying Auvelity  11/28/22 appt noted: Received Spravato 84 mg today as scheduled.  Tolerated it well without nausea or vomiting headache or chest pain or palpitations.  Expected dissociation gradually resolved over the 2 hour observation period. She feels 50% less  depressed with Spravto and wants to continue it.   Continues meds Adderall XR 20 mg every morning, Wellbutrin XL 450 every morning, clonidine 0.2 mg twice daily, lorazepam 1 mg every 8 hours as needed.  Trazodone 50 mg tablets 1-2 nightly as needed insomnia Has started nortriptyline 75 mg nightly for about 3 weeks and DT level 176, reduced to 50 mg HS early November. pramipexole increased to 0.75 mg twice daily off label for treatment resistant depression  Last week she was more depressed than usual for reasons that are not clear.  She has not had a clear answer from Filomena Jungling about [employer] options.  States that they have not returned her call.  She is asked about Auvelity retry in place of Wellbutrin.  Has still been walking.  12/03/22 appt noted: Received Spravato 84 mg today  as scheduled.  Tolerated it well without nausea or vomiting headache or chest pain or palpitations.  Expected dissociation gradually resolved over the 2 hour observation period. She feels 50% less depressed with Spravto and wants to continue it.   Continues meds Adderall XR 20 mg every morning, Wellbutrin XL 450 every morning, clonidine 0.2 mg twice daily, lorazepam 1 mg every 8 hours as needed.  Trazodone 50 mg tablets 1-2 nightly as needed insomnia started nortriptyline 75 mg nightly for about 3 weeks and DT level 176, reduced to 50 mg HS early November. pramipexole increased to 0.75 mg twice daily off label for treatment resistant depression  No SE .  Satisfied with meds. Depression is better in the last week.  Not sure why that is the case.  Still walking daily and that helps and journaling 3 pages daily.  Working on Biomedical scientist.  No changes desire.  Clearly benefits from Spravato but not 100%.  Still considering TMS.  12/10/22 appt noted: Received Spravato 84 mg today as scheduled.  Tolerated it well without nausea or vomiting headache or chest pain or palpitations.  Expected dissociation gradually resolved over the 2 hour observation period. She feels 50% less depressed with Spravto and wants to continue it.   Continues meds Adderall XR 20 mg every morning, Wellbutrin XL 450 every morning, clonidine 0.2 mg twice daily, lorazepam 1 mg every 8 hours as needed.  Trazodone 50 mg tablets 1-2 nightly as needed insomnia started nortriptyline 75 mg nightly for about 3 weeks and DT level 176, reduced to 50 mg HS early November. pramipexole increased to 0.75 mg twice daily off label for treatment resistant depression  No SE .   Depression was worse this week for no apparent reason.  She struggled being positive.  She has felt more discouraged.  She has felt more anxious.  She has had a hard time doing tasks.  She is interested in retrying the [employer] which we had discussed previously. Plan: She agrees to  [employer].  To improve tolerability and reduce risk of side effects, Stop Wellbutrin and start Auvelity 1 in the morning for 1 week then 1 twice daily AndReduce pramipexole 0.5 mg BID and reduce nortriptyline to 50 mg HS  12/17/22 appt noted: Received Spravato 84 mg today as scheduled.  Tolerated it well without nausea or vomiting headache or chest pain or palpitations.  Expected dissociation gradually resolved over the 2 hour observation period. She feels 50% less depressed with Spravto and wants to continue it.   Continues meds Adderall XR 20 mg every morning, stopped Wellbutrin XL 150 every morning, clonidine 0.2 mg twice daily, lorazepam 1 mg every  8 hours as needed.  Trazodone 50 mg tablets 1-2 nightly as needed insomnia Has started nortriptyline 75 mg nightly for about 3 weeks and DT level 176, reduced to 50 mg HS early November. pramipexole 0.375 mg twice daily off label for treatment resistant depression with plan to stop Started Auvelity 1 AM Still depressed to moderate degree.  Tolerating Auvelity so far.  No other problems with meds.  Willing to give Auvelity a chance. Plan: She agrees to [employer].  Continue 1 AM and when tolerated then increase to BID Stop pramipexole.  12/26/22 received Spravato  01/01/23 appt noted: Received Spravato 84 mg today as scheduled.  Tolerated it well without nausea or vomiting headache or chest pain or palpitations.  Expected dissociation gradually resolved over the 2 hour observation period. She feels 50% less depressed with Spravto and wants to continue it.   Continues meds Adderall XR 20 mg every morning, stopped Wellbutrin XL 150 every morning, clonidine 0.2 mg twice daily, lorazepam 1 mg every 8 hours as needed.  Trazodone 50 mg tablets 1-2 nightly as needed insomnia Has started nortriptyline 75 mg nightly for about 3 weeks and DT level 176, reduced to 50 mg HS early November. Forgot to reduce pramipexole stoll taking  0.5 mg twice daily off  label for treatment resistant depression  Started Auvelity 1 AM & PM last week. Occ misses Spravato DT htn.this week a better than last.   Painting again more and it helps mood.  Confidence still low and not as likely to socialize as normal but enjoys family.   Still ambivalent about TMS. Tolerating meds.  Including the increase in [place].    01/08/23 appt noted: Received Spravato 84 mg today as scheduled.  Tolerated it well without nausea or vomiting headache or chest pain or palpitations.  Expected dissociation gradually resolved over the 2 hour observation period. She feels 50% less depressed with Spravto and wants to continue it.   Continues meds Adderall XR 20 mg every morning, stopped Wellbutrin XL 150 every morning, clonidine 0.2 mg twice daily, lorazepam 1 mg every 8 hours as needed.  Trazodone 50 mg tablets 1-2 nightly as needed insomnia Has started nortriptyline 75 mg nightly for about 3 weeks and DT level 176, reduced to 50 mg HS early November. reduced pramipexole to 0.5 mg daily off label for treatment resistant depression  Started Auvelity 1 AM & PM mid Feb. More depressed as week progresses.  Thinks it is worse with less pramipexole.  More negative.  Able to function but feels miserable.  No SI.  Hopeless.  Sleep ok.  Tolerating meds.   [facility] to [facility] about [facility]MS and appt mid March. Plan: increase pramipexole to 0.5 mg TID for dep bc seemed to worsen with the reduction.  01/15/23 appt noted: Received Spravato 84 mg today as scheduled.  Tolerated it well without nausea or vomiting headache or chest pain or palpitations.  Expected dissociation gradually resolved over the 2 hour observation period. She feels 50% less depressed with Spravto and wants to continue it.   Continues meds Adderall XR 20 mg every morning, stopped Wellbutrin XL 150 every morning, clonidine 0.2 mg twice daily, lorazepam 1 mg every 8 hours as needed.  Trazodone 50 mg tablets 1-2 nightly as needed  insomnia Has started nortriptyline 75 mg nightly for about 3 weeks and DT level 176, reduced to 50 mg HS early November. Increased pramipexole to 0.5 mg  to TID  off label for treatment resistant depression  Started Auvelity 1 AM & PM mid Feb. markedly better depression the increase in pramipexole.  She feels like her depression is almost resolved.  She is very pleased with the response.  She is tolerating the medications well  01/30/23 appt noted: Received Spravato 84 mg today as scheduled.  Tolerated it well without nausea or vomiting headache or chest pain or palpitations.  Expected dissociation gradually resolved over the 2 hour observation period. She feels 50% less depressed with Spravto and wants to continue it.   Continues meds Adderall XR 20 mg every morning, stopped Wellbutrin XL 150 every morning, clonidine 0.2 mg twice daily, lorazepam 1 mg every 8 hours as needed.  Trazodone 50 mg tablets 1-2 nightly as needed insomnia Has started nortriptyline 75 mg nightly for about 3 weeks and DT level 176, reduced to 50 mg HS early November. Increased pramipexole to 0.5 mg  to TID  off label for treatment resistant depression  Started Auvelity 1 AM & PM mid Feb. Mood markedly better with pramipexole added.  Nearly normal mood now with minimal depression.  More social and outgoing and motivated and resolved anhedonia. No SE.  Compliant.  02/04/23 appt noted: Continues meds Adderall XR 20 mg every morning, stopped Wellbutrin XL 150 every morning, clonidine 0.2 mg twice daily, lorazepam 1 mg every 8 hours as needed.  Trazodone 50 mg tablets 1-2 nightly as needed insomnia Has started nortriptyline 75 mg nightly for about 3 weeks and DT level 176, reduced to 50 mg HS early November. Increased pramipexole to 0.5 mg  to TID  off label for treatment resistant depression  Started Auvelity 1 AM & PM mid Feb. Received Spravato 84 mg today as scheduled.  Tolerated it well without nausea or vomiting headache  or chest pain or palpitations.  Expected dissociation gradually resolved over the 2 hour observation period. She feels 50% less depressed with Spravto and wants to continue it.  The pramipexole got her the rest of the way better.  Doesn't want to cut back on any tx bc afraid of relapse. Tolerating meds.  02/15/23 appt noted: Continues meds Adderall XR 20 mg every morning, stopped Wellbutrin XL 150 every morning, clonidine 0.2 mg twice daily, lorazepam 1 mg every 8 hours as needed.  Trazodone 50 mg tablets 1-2 nightly as needed insomnia Has started nortriptyline 75 mg nightly for about 3 weeks and DT level 176, reduced to 50 mg HS early November. Increased pramipexole to 0.5 mg  to TID  off label for treatment resistant depression  Started Auvelity 1 AM & PM mid Feb. Received Spravato 84 mg today as scheduled.  Tolerated it well without nausea or vomiting headache or chest pain or palpitations.  Expected dissociation gradually resolved over the 2 hour observation period. She feels no longer depressed.   The pramipexole got her the rest of the way better.  Doesn't want to cut back on any tx bc afraid of relapse. Tolerating meds.  02/18/23 appt noted: Continues meds Adderall XR 20 mg every morning, stopped Wellbutrin XL 150 every morning, clonidine 0.2 mg twice daily, lorazepam 1 mg every 8 hours as needed.  Trazodone 50 mg tablets 1-2 nightly as needed insomnia Has started nortriptyline 75 mg nightly for about 3 weeks and DT level 176, reduced to 50 mg HS early November. Increased pramipexole to 0.5 mg  to TID  off label for treatment resistant depression  Started Auvelity 1 AM & PM mid Feb. Received Spravato 84 mg today as scheduled.  Tolerated it well without nausea or vomiting headache or chest pain or palpitations.  Expected dissociation gradually resolved over the 2 hour observation period. She feels no longer depressed except when ran out of Auvelity this week DT lack of availability.  Much worse  without it..   The pramipexole got her the rest of the way better.  Doesn't want to cut back on any tx bc afraid of relapse. Tolerating meds.  02/25/23 appt: Received Spravato 84 mg today as scheduled.  Tolerated it well without nausea or vomiting headache or chest pain or palpitations.  Expected dissociation gradually resolved over the 2 hour observation period. Continues meds Adderall XR 20 mg every morning, stopped Wellbutrin XL 150 every morning, clonidine 0.2 mg twice daily, lorazepam 1 mg every 8 hours as needed.  Trazodone 50 mg tablets 1-2 nightly as needed insomnia Has started nortriptyline 75 mg nightly for about 3 weeks and DT level 176, reduced to 50 mg HS early November. Increased pramipexole to 0.5 mg  to TID  off label for treatment resistant depression  Started Auvelity 1 AM & PM mid Feb. No SE Still dramatically better with meds and Spravato.  Not 100% over depression. Had GS born last week and able to enjoy it now.  Is a little sleepy with pramipexole but manageable.  No change desired.  Sleep is ok.  03/04/23 appt noted: Received Spravato 84 mg today as scheduled.  Tolerated it well without nausea or vomiting headache or chest pain or palpitations.  Expected dissociation gradually resolved over the 2 hour observation period. Continues meds Adderall XR 20 mg every morning, stopped Wellbutrin XL 150 every morning, clonidine 0.2 mg twice daily, lorazepam 1 mg every 8 hours as needed.  Trazodone 50 mg tablets 1-2 nightly as needed insomnia nortriptyline reduced to 50 mg HS early November. Increased pramipexole to 0.5 mg  to TID  off label for treatment resistant depression  Started Auvelity 1 AM & PM mid Feb. Still generally doing well with meds and Spravato except when got tooth abscess.  Freels more depressed after dental surgery.   No SE with meds.   Sleep is OK.  No med changes desired  03/11/23 appt noted: Received Spravato 84 mg today as scheduled.  Tolerated it well  without nausea or vomiting headache or chest pain or palpitations.  Expected dissociation gradually resolved over the 2 hour observation period. Continues meds Adderall XR 20 mg every morning, stopped Wellbutrin XL 150 every morning, clonidine 0.2 mg twice daily, lorazepam 1 mg every 8 hours as needed.  Trazodone 50 mg tablets 1-2 nightly as needed insomnia nortriptyline reduced to 50 mg HS early November. Increased pramipexole to 0.5 mg  to TID  off label for treatment resistant depression  Started Auvelity 1 AM & PM mid Feb. Some increase depression with infection and needing amoxiciliin this week.  Otherwie still doing well with mood and meds. No med changes desired or indicated. No SE  03/21/2023 appointment noted: Continues meds Adderall XR 20 mg every morning, stopped Wellbutrin XL 150 every morning, clonidine 0.2 mg twice daily, lorazepam 1 mg every 8 hours as needed.  Trazodone 50 mg tablets 1-2 nightly as needed insomnia nortriptyline reduced to 50 mg HS early November. Increased pramipexole to 0.5 mg  to TID  off label for treatment resistant depression  Started Auvelity 1 AM & PM mid Feb. Received Spravato 84 mg today as scheduled.  Tolerated it well without nausea or vomiting headache or chest pain or  palpitations.  Expected dissociation gradually resolved over the 2 hour observation period. Depression is much better.  Pretty much resolved this week.  She feels the Auvelity and pramipexole have made the biggest difference.  Obviously the Spravato is also helping significantly.  She wants to continue all of these medications.  She still has anxiety easily.  Especially facing something that she would rather avoid.  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 Tolerating meds without significant side effects. Plan no med changes  03/29/23 appt noted: Continues meds Adderall XR 20 mg every morning, stopped  Wellbutrin XL 150 every morning, clonidine 0.2 mg twice daily, lorazepam 1 mg every 8 hours as needed.  Trazodone 50 mg tablets 1-2 nightly as needed insomnia, nortriptyline 50 mg HS,  pramipexole 0.5 mg  to TID  off label for treatment resistant depression  Started Auvelity 1 AM & PM mid Feb 2024 No SE Received Spravato 84 mg today as scheduled.  Tolerated it well without nausea or vomiting headache or chest pain or palpitations.  Expected dissociation gradually resolved over the 2 hour observation period. Depression continues to improve to a point of very mild sx.  Still hasn't gotten her confidence fully back.  She does enjoy social interactions and is productive at work. She is tolerating the meds well without side effects.  We discussed the possibility of reducing some of the medication given the marked response which was positive with pramipexole.  04/04/23 appt noted: Continues meds Adderall XR 20 mg every morning, stopped Wellbutrin XL 150 every morning, clonidine 0.2 mg twice daily, lorazepam 1 mg every 8 hours as needed.  Trazodone 50 mg tablets 1-2 nightly as needed insomnia, nortriptyline 50 mg HS,  pramipexole 0.5 mg  to TID  off label for treatment resistant depression  Started Auvelity 1 AM & PM mid Feb 2024 No SE Received Spravato 84 mg today as scheduled.  Tolerated it well without nausea or vomiting headache or chest pain or palpitations.  Expected dissociation gradually resolved over the 2 hour observation period. Dep is still much improved with increase in pramipexole.  Largely resolved.  Still gets anxious but takes less lorazepam prn but still needs it.  Sleep ok.  Tolerating meds.  04/10/23 appt noted: Continues meds Adderall XR 20 mg every morning, clonidine 0.2 mg twice daily, lorazepam 1 mg every 8 hours as needed.  Trazodone 50 mg tablets 1-2 nightly as needed insomnia, nortriptyline 50 mg HS,  pramipexole 0.5 mg  to TID  off label for treatment resistant depression  Started  Auvelity 1 AM & PM mid Feb 2024 No SE.  Satisfied with meds.   Received Spravato 84 mg today as scheduled.  Tolerated it well without nausea or vomiting headache or chest pain or palpitations.  Expected dissociation gradually resolved over the 2 hour observation period. Dep is still much improved with increase in pramipexole.  Largely resolved.  Still gets anxious but takes less lorazepam prn but still needs it.  Sleep ok.  Tolerating meds. Did an art show in another town; I couldn't have done this a year ago.    05/07/23 appt noted: Continues meds Adderall XR 20 mg every morning, clonidine 0.2 mg twice daily, lorazepam 1 mg every 8 hours as needed.  Trazodone 50 mg tablets 1-2 nightly as needed insomnia, nortriptyline 25-50 mg HS,  pramipexole 0.5 mg  to TID  off label for  treatment resistant depression  Started Auvelity 1 AM & PM mid Feb 2024 No SE.  Satisfied with meds.   Received Spravato 84 mg today as scheduled.  Tolerated it well without nausea or vomiting headache or chest pain or palpitations.  Expected dissociation gradually resolved over the 2 hour observation period. H says she is her old self.  Dep so much better.  Painting going well.  Anxiety in AM and then better.  Wakes with it.  Structured days more.  Negative self talk gone.  Wonderful to have relief.  Good weekend at a wedding.  Enjoys GS; [place].  More outspoken since the dep but not over the top.   More interest overall and satisfied wth resp.  Agrees to wean nortriptyline but doesn't want other med changes.  05/15/23 received Spravato 84  05/27/23 appt noted: Continues meds Adderall XR 20 mg every morning, clonidine 0.2 mg twice daily, lorazepam 1 mg every 8 hours as needed.  Trazodone 50 mg tablets 1-2 nightly as needed insomnia, pramipexole 0.5 mg  to TID  off label for treatment resistant depression  Started Auvelity 1 AM & PM mid Feb 2024 No SE.  Satisfied with meds.   Received Spravato 84 mg today as scheduled.  Tolerated  it well without nausea or vomiting headache or chest pain or palpitations.  Expected dissociation gradually resolved over the 2 hour observation period. Maybe a little more anxious re: weaning off nortriptyline.  Busy summer.  Extremely prolific painting.   Has a pending show and stressed over preparing.   Enjoys [employer].  Kept him yesterday for a couple of hours.   Dep has been like a cloak with physical sensation for a long time but is much better than it was.  Still not 100% all the time.  Struggles with some chronic guilt issues.  Self talk and CBT on herself yesterday had some. Still some fear about travelling and demands but so much better.   Making people laugh again and socially engaged and H agrees so much better. M has been impossible.  WC and immobile.  She wastes money.   Plan no med changes  06/03/23 appt noted: Meds as above.  No SE.  Satisfied with meds.   Received Spravato 84 mg today as scheduled.  Tolerated it well without nausea or vomiting headache or chest pain or palpitations.  Expected dissociation gradually resolved over the 2 hour observation period. Overall she feels better with Spravato.  She is minimally depressed generally when she is alone and not active but she is staying active socially and with her painting.  This is really helped with her depression and she is having more success.  She still has residual anxiety which she feels she needs to take lorazepam during the day.  She is attempting to minimize it but without much success.  She wants to continue the Spravato because of the dramatic improvement she is seen with this and the medications.  06/11/23 appt noted: Meds as above.  No SE.  Satisfied with meds.   Received Spravato 84 mg today as scheduled.  Tolerated it well without nausea or vomiting headache or chest pain or palpitations.  Expected dissociation gradually resolved over the 2 hour observation period. Still has some anxiety wihtout reason an benefit  lorazepam and usually only 1 mg daily.  Makes her sleepy if takes it in the day. Sees sig benefit with pramipexole 0.5 mg TID and consistent with it. Mood is still good overall.  But bc mother still can worry about getting older.  Feels too scared about it but working on it. No concerns with meds.    06/20/23 appt noted:  Continues meds Adderall XR 20 mg every morning, clonidine 0.2 mg twice daily, lorazepam 1 mg every 8 hours as needed.  Trazodone 50 mg tablets 1-2 nightly as needed insomnia, pramipexole 0.5 mg  to TID  off label for treatment resistant depression  Started Auvelity 1 AM & PM mid Feb 2024 No SE.  Satisfied with meds.  Needs each as written. Received Spravato 84 mg today as scheduled.  Tolerated it well without nausea or vomiting headache or chest pain or palpitations.  Expected dissociation gradually resolved over the 2 hour observation period. Marked benefit Spravato and meds remain.  No manic sx.  Residual anxiety is gradually improving.  Sleep is ok. Some residual anxiey at time.  Enjoys GS and family and functioning fine. Plan no med changes  06/25/23 appt noted: Continues meds Adderall XR 20 mg every morning, clonidine 0.2 mg twice daily, lorazepam 1 mg every 8 hours as needed.  Trazodone 50 mg tablets 1-2 nightly as needed insomnia, pramipexole 0.5 mg  to TID  off label for treatment resistant depression  Started Auvelity 1 AM & PM mid Feb 2024 No SE.  Satisfied with meds.  Needs each as written. Received Spravato 84 mg today as scheduled.  Tolerated it well without nausea or vomiting headache or chest pain or palpitations.  Expected dissociation gradually resolved over the 2 hour observation period. Marked benefit Spravato and meds remain.  Minimal depression at this time. No manic sx.  Residual anxiety is gradually improving and she is using much less lorazepam.  Sleep is ok. Some residual anxiey at time.  Enjoys GS and family and functioning fine. Plan no med  changes  07/04/23 appt noted: Continues meds Adderall XR 20 mg every morning, clonidine 0.2 mg twice daily, lorazepam 1 mg every 8 hours as needed.  Trazodone 50 mg tablets 1-2 nightly as needed insomnia, pramipexole 0.5 mg  to TID  off label for treatment resistant depression  Started Auvelity 1 AM & PM mid Feb 2024 No SE.  Satisfied with meds.  Needs each as written. Received Spravato 84 mg today as scheduled.  Tolerated it well without nausea or vomiting headache or chest pain or palpitations.  Expected dissociation gradually resolved over the 2 hour observation period.  Good experience today. Dealing with stress Of having to place her into a nursing facility.  However she has handled it well.  Patient has been able to take over responsibilities in the family that she could not have done prior to [facility] and current meds.  Her depression is in remission.  She she is not highly anxious usually but still needs lorazepam in the evening.  No problems or concerns with meds.  ADD is well managed with the Adderall.  She is productive and enjoying life including working family.   ECT-MADRS    Flowsheet Row Clinical Support from 01/08/2023 in [facility] Clinical Support from 08/06/2022 in [facility] Clinical Support from 07/04/2022 in [facility] Clinical Support from 05/21/2022 in [facility] Office Visit from 03/02/2022 in [facility]  MADRS Total Score 21 29 15 27  46      Past Psychiatric Medication Trials: fluoxetine, duloxetine, Viibryd, Pristiq, sertraline, citalopram,  Trintellix anxious and SI Wellbutrin XL 450 Auvelity  1 dose nortriptyline 75 mg nightly for about 3 weeks and DT level 176, reduced to 50 mg HS NR. Pramipexole 1 mg NR Adderall, Adderall XR, Vyvanse, Ritalin, Strattera low dose NR Lorazepam Trazodone  Depakote,  lamotrigine cog  complaints Lithium remotely Abilify 7.5  Vraylar 1.5 mg daily agitation and insomnia Rexulti insomnia Latuda 40 one dose, CO anxious and SI Seroquel XR 300 Olanzapine 10  At visit November 12, 2019. We discussed Patient developed an increasingly severe alcohol dependence problem since her last visit in January.  She went to [facility] and has had no alcohol abuse since .  She never abused stimulants but they took her off the stimulants at [facility].  Her ADD was markedly worse.  The Wellbutrin did not help the ADD.   D history lamotrigine rash at 67 yo  Review of Systems:  Review of Systems  Musculoskeletal:  Positive for back pain. Negative for arthralgias and joint swelling.       SP hip surgery October 2020  Neurological:  Negative for tremors.  Psychiatric/Behavioral:  Negative for agitation, behavioral problems, confusion, decreased concentration, dysphoric mood, hallucinations, self-injury, sleep disturbance and suicidal ideas. The patient is nervous/anxious. The patient is not hyperactive.        Forgetful at times about med recommendations but better now    Medications: I have reviewed the patient's current medications.  Current Outpatient Medications  Medication Sig Dispense Refill   amLODipine (NORVASC) 2.5 MG tablet Take 2.5 mg by mouth daily.     amphetamine-dextroamphetamine (ADDERALL XR) 20 MG 24 hr capsule Take 1 capsule (20 mg total) by mouth every morning. 30 capsule 0   amphetamine-dextroamphetamine (ADDERALL XR) 20 MG 24 hr capsule Take 1 capsule (20 mg total) by mouth every morning. 30 capsule 0   cloNIDine (CATAPRES) 0.2 MG tablet TAKE 1 TABLET BY MOUTH TWICE A DAY 180 tablet 0   Dextromethorphan-buPROPion ER (AUVELITY) 45-105 MG TBCR Take 1 tablet by mouth 2 (two) times daily. 60 tablet 3   Esketamine HCl, 84 MG Dose, (SPRAVATO, 84 MG DOSE,) 28 MG/DEVICE SOPK USE 3 SPRAYS IN EACH NOSTRIL ONCE A WEEK 3 each 2   iron polysaccharides (NIFEREX) 150 MG  capsule TAKE 1 CAPSULE BY MOUTH EVERY DAY 90 capsule 1   LORazepam (ATIVAN) 1 MG tablet Take 1 tablet (1 mg total) by mouth every 8 (eight) hours as needed. for anxiety 90 tablet 1   losartan (COZAAR) 50 MG tablet Take 50 mg by mouth daily.     nebivolol (BYSTOLIC) 2.5 MG tablet Take 2.5 mg by mouth daily.     pramipexole (MIRAPEX) 0.5 MG tablet Take 1 tablet (0.5 mg total) by mouth 3 (three) times daily. 270 tablet 1   traZODone (DESYREL) 50 MG tablet TAKE 1-2 TABLETS BY MOUTH NIGHTLY AS NEEDED FOR SLEEP 180 tablet 1   No current facility-administered medications for this visit.    Medication Side Effects: None  Allergies:  Allergies  Allergen Reactions   Metronidazole Shortness Of Breath and Other (See Comments)    Heart pounding   Ferrlecit [Na Ferric Gluc Cplx In Sucrose] Other (See Comments)    Infusion reaction 05/12/2019    Past Medical History:  Diagnosis Date   ADHD    Anemia    Anxiety    Arthritis    Depression    Heart murmur    i went to see a cardiologit slast eyar  and i had zero plaque,    PONV (postoperative nausea  and vomiting)    Recovering alcoholic in remission [facility])     Family History  Problem Relation Age of Onset   Atrial fibrillation Mother    CAD Father     Past Medical History, Surgical history, Social history, and Family history were reviewed and updated as appropriate.   Please see review of systems for further details on the patient's review from today.   Objective:   Physical Exam:  There were no vitals taken for this visit.  Physical Exam Constitutional:      General: She is not in acute distress. Neurological:     Mental Status: She is alert and oriented to person, place, and time.     Coordination: Coordination normal.     Gait: Gait normal.  Psychiatric:        Attention and Perception: Attention and perception normal.        Mood and Affect: Mood is anxious. Mood is not depressed. Affect is not tearful.        Speech:  Speech is not rapid and pressured.        Behavior: Behavior is not hyperactive.        Thought Content: Thought content is not paranoid or delusional. Thought content does not include homicidal or suicidal ideation. Thought content does not include suicidal plan.        Cognition and Memory: Cognition normal. Memory is not impaired.        Judgment: Judgment normal.     Comments: Insight intact. No auditory or visual hallucinations. No delusions.  Depression minimal with pramipexole TID and Auvelity generally.  No manic sx     Lab Review:     Component Value Date/Time   NA 137 01/12/2021 1430   NA 140 11/18/2018 1544   K 3.8 01/12/2021 1430   CL 108 01/12/2021 1430   CO2 22 01/12/2021 1430   GLUCOSE 94 01/12/2021 1430   BUN 14 01/12/2021 1430   BUN 20 11/18/2018 1544   CREATININE 0.82 01/12/2021 1430   CALCIUM 8.9 01/12/2021 1430   PROT 6.6 01/12/2021 1430   ALBUMIN 3.9 01/12/2021 1430   AST 12 (L) 01/12/2021 1430   ALT 11 01/12/2021 1430   ALKPHOS 46 01/12/2021 1430   BILITOT 0.5 01/12/2021 1430   GFRNONAA >60 01/12/2021 1430   GFRAA >60 09/02/2019 0249   GFRAA >60 01/27/2019 0811       Component Value Date/Time   WBC 4.5 01/12/2021 1430   RBC 4.32 01/12/2021 1430   HGB 12.8 01/12/2021 1430   HGB 12.9 07/17/2019 0953   HCT 38.5 01/12/2021 1430   HCT 21.9 (L) 12/25/2018 1221   PLT 272 01/12/2021 1430   PLT 286 07/17/2019 0953   MCV 89.1 01/12/2021 1430   MCH 29.6 01/12/2021 1430   MCHC 33.2 01/12/2021 1430   RDW 12.4 01/12/2021 1430   LYMPHSABS 1.4 01/12/2021 1430   MONOABS 0.4 01/12/2021 1430   EOSABS 0.0 01/12/2021 1430   BASOSABS 0.0 01/12/2021 1430    No results found for: "POCLITH", "LITHIUM"   No results found for: "PHENYTOIN", "PHENOBARB", "VALPROATE", "CBMZ"   .res Assessment: Plan:    Recurrent major depression resistant to treatment (HCC)  Generalized anxiety disorder  Insomnia due to mental condition  Attention deficit hyperactivity  disorder (ADHD), predominantly inattentive type   She has treatment resistant major depression ongoing with 95-100% better with Spravato.  Previously  discussed some of her  abnormal behaviors last year leading to this depressive episode getting  worse which she says were associated with heavy use of delta 8 and not a manic episode.  She realizes now that that was not good for her.  She stopped all use of other drugs including those available over-the-counter such as delta 8 or any other THC related products.  She is no longer having any of those types of behaviors and instead is depressed.  Depression nearly resolved with pramipexole TID and relapsed when out of Auvelity. Anhedonia resolved.  Depression resolved recently after increasing pramipexole 0.5 mg TID, brief relapse when out of Auvelity Consider switch to ER to reduce sleepiness  Patient was administered Spravato 84 mg intranasally today.  The patient experienced the typical dissociation which gradually resolved over the 2-hour period of observation.  There were no complications.  Specifically the patient did not have nausea or vomiting or headache.  Blood pressures monitored at the 40-minute and 2-hour follow-up intervals.  Borderline high.  By the time the 2-hour observation period was met the patient was alert and oriented and able to exit without assistance.  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  See nursing note for further details.She wants to continue Spravato. We discussed discussed the side effects in detail as well as the protocol required to receive Spravato.    has failed all major categories of antidepressants MAO inhibitors which have not been tried.  She is tolerating Auvelity.  Continue 1  BID bc more depressed without it dramatically.  Relapsed witout it.  Worse with less pramipexole. Increased to 0.5 mg TID on about end of Feb  2024 and markedly better Dosing range off label for depression ranges from 1-5 mg daily.  Disc risk compulsive impulsive behavior and mania.    Started Spravato 84 mg twice weekly on 03/16/2022.  Wants to continue weekly and feels she needs it.   Disc risk relapse if less than weekly and she doesn't seem to do well with less than weekily.  Adderall  XR 20 mg AM   Ativan 1 mg 3 times daily as needed anxiety but try to cut it back.  She still feels she needs it. Is not ideal to use benzodiazepine with stimulant but because of the severity of her symptoms it has been necessary.  Hope to eventually eliminate the benzodiazepine.  Encourage her to embrace this goal.    Continue clonidine 0.2 mg BID off label for anxiety and helps BP partially. BP is better controlled and more consistent.Rec keep track of BP and discuss with PCP. It has been up and down.  Sometimes needs extra clonidine before Spravato.  Better lately.  Discussed potential benefits, risks, and side effects of stimulants with patient to include increased heart rate, palpitations, insomnia, increased anxiety, increased irritability, or decreased appetite.  Instructed patient to contact office if experiencing any significant tolerability issues. She wants to return to usual dose of Adderall for ADD bc of mor poor cognitive function with reduction.  Also discussed that depression will impair cognitive function.  Disc risk polypharmacy.  Relapsed after running out of Auvelity so needs clearly it and pramipexole.   She continues walkly daily with benefit. And journaling.   Has Maintained sobriety.    No med changes indicated today: Continue Adderall XR 20 mg every morning Continue Auvelity 1 tablet twice daily,  continue clonidine 0.2 mg twice daily for anxiety and blood pressure. Continue lorazepam 1 mg 3 times daily she has been  unable to cut back on the dose due to anxiety. Continue pramipexole 0.5 mg 3 times daily off label for dep;  effective. Continue trazodone 50 mg tablets 1-2 nightly as needed for sleep.  FU with Spravato weekly   Meredith Staggers, MD, DFAPA     Please see After Visit Summary for patient specific instructions.  No future appointments.                       No orders of the defined types were placed in this encounter.      -------------------------------

## 2023-07-09 ENCOUNTER — Encounter: Payer: 59 | Admitting: Psychiatry

## 2023-07-11 ENCOUNTER — Ambulatory Visit (INDEPENDENT_AMBULATORY_CARE_PROVIDER_SITE_OTHER): Payer: 59 | Admitting: Psychiatry

## 2023-07-11 ENCOUNTER — Ambulatory Visit: Payer: 59

## 2023-07-11 ENCOUNTER — Encounter: Payer: Self-pay | Admitting: Psychiatry

## 2023-07-11 VITALS — BP 137/98 | HR 53

## 2023-07-11 DIAGNOSIS — F900 Attention-deficit hyperactivity disorder, predominantly inattentive type: Secondary | ICD-10-CM

## 2023-07-11 DIAGNOSIS — F339 Major depressive disorder, recurrent, unspecified: Secondary | ICD-10-CM

## 2023-07-11 DIAGNOSIS — F411 Generalized anxiety disorder: Secondary | ICD-10-CM | POA: Diagnosis not present

## 2023-07-11 DIAGNOSIS — F5105 Insomnia due to other mental disorder: Secondary | ICD-10-CM | POA: Diagnosis not present

## 2023-07-11 MED ORDER — LORAZEPAM 1 MG PO TABS
1.0000 mg | ORAL_TABLET | Freq: Three times a day (TID) | ORAL | 1 refills | Status: DC | PRN
Start: 2023-07-11 — End: 2023-10-24

## 2023-07-11 NOTE — Progress Notes (Addendum)
Laura Chang [id number] 06/12/1956 67 y.o.  Subjective:   Patient ID:  Laura Chang is a 67 y.o. (DOB 03-17-1956) female.  Chief Complaint:  Chief Complaint  Patient presents with   Follow-up   Depression   Anxiety   ADD      Laura Chang presents to the office today for follow-up of depression and anxiety and ADD.  seen November 12, 2019.  Melted down in 2020.  Went to [facility] in July.  No withdrawal.  1 drink since then.  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 She was on no stimulant and no SSRI but was taking Strattera and Wellbutrin.  The following changes were made. Stop Strattera. OK restart stimulant bc severe ADD Restart Adderall 1 daily for a few days and if tolerated then restart 1 twice daily. If not tolerated reduce the dosage if needed. May need to stop Wellbutrin if not tolerating the stimulant.  Yes.  DC Wellbutrin Restart Prozac 20 mg daily.  February 2021 appointment with the following noted: Completed grant proposal.  Couldn't doit without Adderall.  Sold a bunch of work.   Adderall XR lasts about 3 pm.  Strength seems about right.  BP been OK.  Not jittery.   Stopped Wellbutrin but had no SE. Mood drastically better with grant proposal and back on fluoxetine.  Less depressed and lethargic.  No anxiety.  Cut back on coffee. Started back with devotions and stronger faith. Plan: Continue Prozac 20 mg daily. May have to increase the dose at some point in the future given that she usually was taking higher dosages but she is getting good response at this time. Restart Wellbutrin off label for ADD since can't get 2 ADDERALL daily. 150 mg daily then 300 mg daily. She can adjust the dose between 150 mg and 300 mg daily to get the optimal effect.   05/11/2020 appointment with the following noted: Has been inconsistent with Prozac and Wellbutrin. Not sure of the effect of Wellbutrin. Biggest deterrent in work is anxiety.   Some of the work is conceptual and difficult at times.  Can feel she's not up to a project at times.  Overall is OK but would like a steadier benefit from stimulant.  Exhausted from managing concentration and keeping up with things from the day.  Loses things.  Not good keeping up with schedule. Overall productive and emotionally OK. Can feel Adderall wear off. Mood is better in summer and worse in the winter.   F died in October 01, 2023 and that is a loss. No SE Wellbutrin. Still attends AA meetings.  Real benefit from [facility] last year. Recognizes effect of anemia on ADD and mood.  Had iron infusions last winter. Plan:  Wellbutrin off label for ADD since can't get 2 ADDERALL daily. 150 mg daily then 300 mg daily.  01/24/2021 appointment with following noted: Doing a program called [employer] mindfulness app since Xmas.  CBT app helped the depression.  App helped her focus better.  Lost sign weight. Writing a lot. Before Xmas felt depressed and started negative thinking worse, self denigrating. Not drinking. More isolated.   Recognizes mo is narcissist.    Didn't tell anyone she was born until 3 mos later.  M aloof and uninterested in pt.  Lied about her birthday.  Mo lack of affection even with pt's kids. Going to AA for a year and it helped her to quit drinking. Also misses kids being gone  with a hole also. Plan: No med changes  05/04/2021 appointment with the following noted: Therapist Laura Chang thinks she's manic. Lost weight to 144#.   States she is still sleeping okay.  Admits she is hyper and recognizes that she is likely manic.  She feels great, euphoric with an increased sense of spiritual connectedness to God.  She has racing thoughts and talks fast and talks a lot and this is noted by her husband.  He thinks she is a bit hyper.  She has been able to maintain sobriety although she will have 1 glass of wine on special occasions but does not drink by herself.  She is not drinking to  excess.  She denies any dangerous impulsivity.  She is clearly not depressed and not particularly anxious.  She has no concerns about her medication and she has been compliant.  06/16/21 appt noted: So much better.  Going through a lot but the manic thing happened on top of it.  So much slower.  Didn't feel like losing anything with risperidone.  Likes the Adderalll at 10 mg. Some drowsiness in the AM and very drowsy from risperidone 2 mg HS. Prayer life is better. Handling stress better. Less depressed with risperidone. Still likes trazodone. Sleeps well. Plan: Reduce Prozac to 10 mg daily.  Consider stopping it because it can feel the mania however she is reluctant to do that because she fears relapse of depression. Reduce risperidone to 1.5 mg nightly due to side effects.  Discussed risk of worsening mania.  07/25/2021 appointment with the following noted: Misses the Adderall and hard to function without it. Depressed now. Heavy chest.  Anxious and guilty.  Body feels heavy.   Hates Wellbutrin.   Plan: Increase fluoxetine to 20 mg daily Add Abilify 1/2 of 15 mg tablet daily Wean wellbutrin by 1 tablet each week  bc she feels it is not helpful and DT polypharmacy Reduce risperidone to 1 daily for 1 week and stop it. Disc risk of mania. Increase Adderall to XR 20 mg AM  08/08/21 Much less depressed and starting to feel normal I feel a lot better. No SE.  Speech normal off risperidone. Sleeping OK on trazaodone and enough.   Noticed benefit from Adderall again. Plan: continue fluoxetine to 20 mg daily Continue Abilify 1/2 of 15 mg tablet daily for depression and mania continue Adderall to XR 20 mg AM  10/10/2021 phone call: Pt stated she feels like the Abilify should be decreased to 5mg .She said she is depressed but rational and not suicidal.She has an appt Monday and can wait until then if you prefer. MD response: Reduce the Abilify to 7.5 mg every other day.  We will meet on 10/16/2021  and decide what to do from there.  10/16/2021 appointment with the following noted: More depressed.  Most depressed I've ever been.  Just numb.  Sense of grief.   Thinks the manic episode was unlike anything else she ever had.  Doesn't want to medicate against it.  Don't enjoy people.  Easily overwhelmed.  Had some death thoughts but not suicidal.  Has been functional.  Feels better today after reducing Abilify to every other day but she is only been doing that for 3 days. A/P: Episode of post manic depression was explained. continue fluoxetine to 20 mg daily Hold Abilify for 1 week then resume Abilify 1/2 of 15 mg tablet every other day for depression and mania continue Adderall to XR 20 mg AM  10/27/2021  appointment with the following noted: I'm doing so much better.  Handling the depression better. Better self talk and spiritual focus has helped.   Dep 6/10 manifesting as anxiety with low confidence.   F died 2  years ago and M 67 yo and is dependent . She is working hard to feel better but still feels depressed.  She almost feels like she has a little more anxiety since restarting Abilify every other day. Plan: continue fluoxetine to 20 mg daily DC Abilify .  Vrayalar 1.5 mg QOD to try to get rid of depression ASAP. continue Adderall to XR 20 mg AM  11/10/2021 appointment with the following noted: Busy with Xmas and it was fun with family but then a big let down.  Did well with it.  Functioned well with it.  Working hard on things with depression.  Not shutting down. Not sure but feels better today but yesterday was hard.  Difficulty dealing with mother.  She won't do anything to help herself.  Yesterday with her all day.  Won't do PT and has isolated herself.    Lack of confidence.   No SE with Vraylar.  11/24/21 urgent appointment appt noted: More and more depressed.   So anxious and doesn't want to be alone but can do so. No appetite. Hurts inside. Has had some fleeting suicidal  thoughts but would not act on them.  Tolerating meds. Has been consistent with Vraylar 1.5 mg every other day, fluoxetine 20 mg daily Plan: Increase Vraylar to 1.5 mg daily Change Prozac to Trintellix 10 mg daily. Discussed side effects of each continue Adderall to XR 20 mg AM  12/27/2021 appointment with the following noted: Not OK.  I feel less depressed but feels bat shit. Not sleeping well.  Extremely anxious. Off and on sleep. 3-4 hours of sleep.   Still having daily SI.  But also become obvious has so much to do.  Overwhelmed by tasks.   Needs anxiety meds to just function. Not more motivated.  Walked yesterday.   Feels afraid like in trouble but not irritable or angry. DC DT agitation Vraylar to 1.5 mg daily Change Prozac to Trintellix 10 mg daily. Hold Adderall to XR 20 mg AM Clonidine 0.1 1/2 tablet twice daily for 2 days and if needed for anxiety and sleep increase to 1 twice daily Ok temporary Ativan 1 mg 3 times daily as needed anxiety  01/05/22 appt noted: Off fluoxetine and  Trintellix.  Only on Ativan, trazodone and Adderall XR 20 plus added clonidine 0.1 mg BID Didn't think she needed to start Trintellix. Not taking Ativan.   Didn't like herself last week. Feels some better today. Wonders if the manic sx Not agitated.  Anxiety kind of calmed down.  A lot to be anxious about situationally.  $ stress. Concerns about downers with meds. Can't access normal personality. ? Lethargy and inability to talk as sE. Plan: Latuda 20-40 mg daily with food. Adderall to XR 20 mg AM Clonidine 0.1 1/2 tablet twice daily  reduce dose to be sure no SE Ok temporary Ativan 1 mg 3 times daily as needed anxiety  01/19/22 appt noted: Taking Latuda 20 mg daily.  Took 40 mg once and felt anxious and  SI Still depressed and not very reactive Anxiety mainly about the depression and fears of the future. She wants to revisit manic sx and thinks it was maybe bc taking delta 8 bc was taking a lot  of it so still  doesn't think she's classic bipolar. She wants to only take Prozac bc thinks Latuda is perpetuating depression. Says the delta 8 was very psychaedelic.  When not taking it was not manic.  Sleeping ok again.  Plan: Per her request DC Latuda 20-40 mg daily with food. She wants to continue Prozac alone AMA  Adderall to XR 20 mg AM Clonidine 0.1 1/2 tablet twice daily  reduce dose to be sure no SE Ok temporary Ativan 1 mg 3 times daily as needed anxiety  01/23/2022 phone call complaining of increased anxiety since stopping Latuda.  She will try increasing clonidine.  01/26/2022 phone call not feeling well and wanted to restart the Vraylar.  However notes indicate that had made her agitated therefore she was encouraged to pick up samples of Rexulti 1 mg and start that instead.  02/06/2022 phone call: Stating she felt the Rexulti was helping with depression but she was not sleeping well and obsessing over things.  She was encouraged to increase Rexulti to 2 mg daily and increase trazodone for sleep.  02/09/2022 appointment with the following noted: This was an urgent work in appointment No sleep last night with trazodone 100 mg HS Nothing really better depression or anxiety. Ruminating negative anxious thoughts. Did not tolerate Rexulti because it was causing insomnia.  Does not think it helped depression.  Lacks emotion that she should have.  Lacks her usual personality.  Some hopeless thoughts.  Some death thoughts.  Some suicidal thoughts without plan or intent Plan: DC Rexulti and Prozac & DC trazodone Adderall to XR 20 mg AM Clonidine 0.1 1/tablet twice daily  reduce dose to be sure no SE Ok temporary Ativan 1 mg 3 times daily as needed anxiety Start Seroquel XR 150 mg nightly  03/02/2022 appointment: Angelique Blonder called back a few days after starting Seroquel stating it was making her more anxious and more depressed.  This seemed unlikely as this medicine rarely ever causes anxiety.   She stopped the medication waited 3 days and called back still had anxiety and depression but thought perhaps the anxiety was a little better.  She did not want to take the Seroquel. She knew about the option of Spravato and wanted to pursue that. Now questions whether to return to Seroquel while waiting to start Spravato bc feels just as bad without it and knows she didn't give it enough time to work.   MADRS 46  ECT-MADRS    Flowsheet Row Clinical Support from 07/11/2023 in [facility] Clinical Support from 01/08/2023 in [facility] Clinical Support from 08/06/2022 in [facility] Clinical Support from 07/04/2022 in [facility] Clinical Support from 05/21/2022 in [facility]  MADRS Total Score 9 21 29 15 27       03/14/22 appt noted: Pt received Spravato 56 mg first dose today with some dissociative sx which were not severe.  She was anxious prior to the administration and felt better after receiving lorazepam 1 mg.  No NV, or HA. Wants to continue Spravato. Ongoing depression and desperate to feel better.  I'm not myself DT deprsssion which is most severe in recent history.  Anhedonia.  Low motivation.  Social avoidance. Continues to think all recent med trials are making her worse.  Sleep ok with Seroquel.  03/16/22 appt noted: Received Spravato 84 mg for the first time.  some dissociative sx which were not severe.  She was anxious prior to  the administration and felt better after receiving lorazepam 1 mg.  No NV, or HA. Wants to continue Spravato.   Does not feel any better or different since the last appt.  Ongoing depression.  Ongoing depression and desperate to feel better.  I'm not myself DT deprsssion which is most severe in recent history.  Anhedonia.  Low motivation.  Social avoidance. Continues to think all recent med trials are making her worse.   Sleep ok with Seroquel.  Does not want to continue Seroquel for TRD.  03/20/2022 appointment noted: Came for Spravato administration today.  However blood pressure was significantly elevated approximately 180/115.  She was given lorazepam 1 mg and clonidine 0.2 mg to try to get it down. She states she regretted stopping the Seroquel XR 300 mg tablets.  She now realizes it was helpful.  She did not sleep much at all last night.  She did not take the Adderall this morning. 2 to 3 hours after arrival blood pressure was still elevated at  170/110, 62 pulse.  For Spravato administration was canceled for today.  She admits to being anxious and depressed.  She is not suicidal.  She is highly motivated to receive the Spravato.  We discussed getting it tomorrow.  03/22/2022 appointment noted: Patient's blood pressure was never stable enough yesterday in order to get her in for Spravato administration.  She was encouraged to see her primary care doctor.  It is better today.  03/26/2022 appointment with the following noted: Blood pressure was better.  Saw her primary care doctor who started on oral Bystolic 2.5 mg daily. Received Spravato 84 mg today as scheduled.  Tolerated it well without nausea or vomiting headache or chest pain or palpitations.  Her blood pressure was borderline but manageable. She remains depressed and anxious.  She is ambivalent about the medicine and desperate to get to feel better.  Continues to have anhedonia and low energy and low motivation and reduced ability to do things.  Less social.  Not suicidal.  03/28/22 appt noted: Received Spravato 84 mg today as scheduled.  Tolerated it well without nausea or vomiting headache or chest pain or palpitations.  Her blood pressure was borderline but manageable. Has not seen any improvement so far.  Tolerating Seroquel.  Inconsistent with Bystolic and BP has been borderline high. Still depressed and anxious and anhedonia.  Low motivation, energy,  productivity. Taking quetiapine and tolerating XR 300 mg nightly.  04/04/22 appt noted: Received Spravato 84 mg today as scheduled.  Tolerated it well without nausea or vomiting headache or chest pain or palpitations.  Her blood pressure was borderline but manageable. Has not seen any improvement so far.  Tolerating Seroquel.   She still tends to think that the medications are making her worse.  She has said this about each of the recent psychiatric medicines including Seroquel.  However her husband thinks she is improved.  She also admits there is some improvement in productivity.  She still feels highly anxious.  She still does not enjoy things as normal.  She still feels desperate to improve as soon as possible. Has been taking Seroquel XR since 03/20/2022  04/10/22 appt noted: Received Spravato 84 mg today as scheduled.  Tolerated it well without nausea or vomiting headache or chest pain or palpitations.  Her blood pressure was borderline but manageable. Has not seen any improvement so far.  Tolerating Seroquel.  Doesn't like Seroquel bc she thinks it flattens here. 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  Tolerated it well without nausea or vomiting headache or chest pain or palpitations.  Her blood pressure was borderline but manageable. Has not seen any improvement so far.  Tolerating Seroquel.  Doesn't like Seroquel bc she thinks it flattens her. Received Spravato 84 mg today as scheduled.  Tolerated it well without nausea or vomiting headache or chest pain or palpitations.  Her blood pressure was borderline but manageable. Has not seen any improvement so far.  Tolerating Seroquel.  Doesn't like Seroquel bc she thinks it flattens here.  We discussed her ambivalence about it. She is starting Auvelity and has tolerated it  the last 2 days without side effect.  She still does not feel like herself and feels flat and not enjoying things with suppressed expressed emotion  04/17/2022 appointment with the following noted: Received Spravato 84 mg today as scheduled.  Tolerated it well without nausea or vomiting headache or chest pain or palpitations.  Her blood pressure was borderline but manageable. Has not seen any improvement so far.  Tolerating Seroquel.  Doesn't like Seroquel bc she thinks it flattens her. She has been tolerating the Auvelity 1 in the morning without side effects for about a week.  She has not noticed significant improvement so far.  She still feels depressed and flat and not herself.  Other people notice that she is flat emotionally.  She is not suicidal.  She does feel discouraged that she is not getting better yet.  04/19/2022 appointment noted: Has increased Auvelity to 1 twice daily for 2 days, continues quetiapine XR 300 mg nightly, clonidine 0.3 mg twice daily, lorazepam 1 mg twice daily for anxiety and Adderall XR 20 mg in the morning. No obious SE but she still thinks quetiapine XR is making her feel down.  But not sedated Received Spravato 84 mg today as scheduled.  Tolerated it well without nausea or vomiting headache or chest pain or palpitations.  Her blood pressure was borderline but manageable. She still feels quite anxious and feels it necessary to take both the clonidine and lorazepam twice a day to manage her anxiety.  She has been consistently down and flat and not herself until yesterday afternoon she noted an improvement in mood and feeling much more like herself with her normal personality reemerging.  She was quite depressed in the morning with very dark negative thoughts.  She did not have those dark negative thoughts this morning.  She had a lot of questions about medication and when she was expecting to be improved and why she has not shown improvement up to now.  04/23/22 appt  noted: Has increased Auvelity to 1 twice daily for 1 week, continues quetiapine XR 300 mg nightly, clonidine 0.3 mg twice daily, lorazepam 1 mg twice daily for anxiety and Adderall XR 20 mg in the morning. No obious SE but she still thinks quetiapine XR is making her feel down.  But not sedated Received Spravato 84 mg today as scheduled.  Tolerated it well without nausea or vomiting headache or chest pain or palpitations.  She is still depressed but admits better function and is able to enjoy social interactions. Tolerating meds.  Would like to feel better for sure. Not herself.  Flat. Plan increase Auvelity to 1 tab BID as planned and reduce Quetiapine to 1/2 of ER 300 mg  bc NR for depression.  04/25/2022 appointment with the following noted: clonidine 0.3 mg twice daily, lorazepam 1 mg twice daily for anxiety and Adderall XR 20 mg in the morning. Seroquel XR 300 HS No obious SE but she still thinks quetiapine XR is making her feel down.  But not sedated Received Spravato 84 mg today as scheduled.  Tolerated it well without nausea or vomiting headache or chest pain or palpitations.  Called yesterday with more anxiety.  Had increased Auvelity for 1 day and reduced Seroquel XR for 1 day.  Felt restless and fearful  05/01/2022 appointment noted: clonidine 0.3 mg twice daily, lorazepam 1 mg twice daily for anxiety and Adderall XR 20 mg in the morning. Seroquel XR 150 HS, Auvelity 1 BID Received Spravato 84 mg today as scheduled.  Tolerated it well without nausea or vomiting headache or chest pain or palpitations.  Nurse has noted patient has called multiple times sometimes asking the same question repeatedly.  It is unclear whether she is truly forgetful or is just anxious seeking reassurance. Patient acknowledges ongoing depression as well as some anxiety but states she has felt a little better in the last couple of days.  She has reduced the Seroquel to 150 mg at night and has increased Auvelity to 1  twice daily but only for 1 day.  So far she seems to be tolerating it.  05/03/22 appt noted: clonidine 0.2 mg twice daily, lorazepam 1 mg twice daily for anxiety and Adderall XR 20 mg in the morning. Seroquel XR 150 HS, Auvelity 1 BID BP high this am about 170/100 and received extra clonidine 0.2 mg and came to receive Spravato.  Not dizzy, no SOB, nor CP but BP is still high Could not receive Spravato today bc BP high and pulse low at 30 ppm. Still depressed and anxious. Plan: continue trial Auvelity with Spravato She needs to get BP and pulse managed  05/08/22 TC: RTC  H Michael NA and mailbox full.  Could not leave message.  Pt  -  talked to she and H on speaker. H worried over wife.  Vacant stare.  Slurs words at times.  Not smiling. Reduced enjoyment.  Depression.  Withdrawn from usual activities.  Some irritability.  Anxious. Disc her concerns meds are making her worse.  Extensive discussion about her treatment resistant status.  There is a consistent pattern of not taking the medicines long enough to get benefit because she believes the meds are making her worse.  However the symptoms she describes as side effects are exactly the same symptoms that she had prior to taking the medication RX for  the depression.  So it is not clear that these are actual side effects. This is true about the 2 most recent meds including Seroquel and Auvelity.  Recommend psychiatric consultation in hopes of improving her comfort level with taking prescribed medications for a sufficient length of time to provide benefit. Extensive discussion about ECT is the treatment of choice for treatment resistant depression.  Spravato may work if she can comply with consistency.  There are medication options but they take longer to work.   Plan:  Reduce clonidine to 0.1 mg BID DT bradycardia.  Talk with PCP about BP and low pulse problems which are interfering with her consistent compliance with Spravato.   Limit lorazepam to  3 -4  mg daily max. Excess use is the cause of slurring speech.  She must stop excess use or will have to stop the med. Stop Auvelity per  her request.  But she has only been on the full dose for a little over a week and clearly has not had time to get benefit from it.  She thinks maybe it is making her more anxious. Reduce Seroquel from 150XR to 50 -100 mg at night IR.  She couldn't sleep when stopped it completely. Will not start new antidepressant until her SE issues are resolved or not. Get second psych opinion from Wellington Hampshire MD or another psychiatrist.  H's sister is therapist in [place] Rink, MD, [facility]  05/16/2022 appointment with the following noted: Received Spravato 84 mg today as scheduled.  Tolerated it well without nausea or vomiting headache or chest pain or palpitations.  She stopped Auvelity as discussed last week. On her own, without physician input, she restarted Wellbutrin XL 450 mg every morning today.  She had taken it in the past.  She feels jittery and anxious. She feels less depressed than she did last week.  But she is still depressed without her usual range of affect.  She still is less social and less motivated than normal. Her primary care doctor increased the dose of losartan Plan: Stop Seroquel Reduce Wellbutrin XL to 300 mg every morning.  Starting the dose at 450 every morning is likely causing side effects of jitteriness and it should not be started at that have a dosage. Recommend she not change meds on her own without MDM put  05/23/2022 appointment with the following noted: Received Spravato 84 mg today as scheduled.  Tolerated it well without nausea or vomiting headache or chest pain or palpitations.  Has not dropped seroquel XR 300 mg 1/2 tablet nightly bc couldn't sleep without it. Has not tried lower dose quetiapine 50 mg HS Still feels depressed.   BP is better managed so far, just saw PCP.  BP is better today and infact is low today. Dropped  clonidine as directed from 0.3 mg BID bc inadequate control of BP to 0.2 mg BID.  However she wants to increase it back to 0.3 mg twice daily because she feels it helped her anxiety better.  Wonders about increasing Wellbutrin for depression.  However she has only been on 300 mg a day for a week.  She was on 450 mg daily in the past.  06/06/22 appt noted: Received Spravato 84 mg today as scheduled.  Tolerated it well without nausea or vomiting headache or chest pain or palpitations.  She is still depressed and anxious.  She wants to try to stop the Seroquel but cannot sleep without some of it.  She is taking lorazepam 1 mg 4 times daily and still having a lot of anxiety.  She wants to increase clonidine back to 0.3 mg twice daily.  She hopes for more improvement She recently went for a second psychiatric opinion as suggested the results of that are pending.  06/11/22 appt noted: Received Spravato 84 mg today as scheduled.  Tolerated it well without nausea or vomiting headache or chest pain or palpitations.  She is still depressed and anxious. Without much change.  Still hopeless, anhedonia, reduced inteterest and motivation.  Tolerating meds. Disc concerns Spravato is not hleping much. Plan: stop Seroquel and start olanzapine 10 mg HS for TRD and anxiety.  06/13/2022 appointment noted: Received Spravato 84 mg today as scheduled.  Tolerated it well without nausea or vomiting headache or chest pain or palpitations.  She is still depressed and anxious. Without much change.  Still hopeless, anhedonia, reduced inteterest  and motivation.  Tolerating meds. Disc concerns Spravato is not helping much as hoped but is improving a bit in the last week. Tolerating meds. Continues Wellbutrin XL 450 AM, tolerating recently started olanzapine  10 mg HS. Sleep is good.   Pending appt with TMS consult.  06/18/22 appt noted: Received Spravato 84 mg today as scheduled.  Tolerated it well without nausea or vomiting  headache or chest pain or palpitations.  Tolerating meds. Continues Wellbutrin XL 450 AM, tolerating recently started olanzapine  10 mg HS. Continues Adderall XR 20 amd and has tried to reduce lorazepam to 1mg  TID Sleep is good.   Pending appt with TMS consult. Depression is a little bit better in the last week with a little improvement in emotional expression and interest.  She is pushing herself to be more active.  Her daughter thought she was a little better than she has been.  However she is still depressed and still not her normal self with anhedonia and reduced emotional expressiveness.  06/20/22 appt noted: Received Spravato 84 mg today as scheduled.  Tolerated it well without nausea or vomiting headache or chest pain or palpitations.  Tolerating meds with a little sleepiness. Continues Wellbutrin XL 450 AM, tolerating recently started olanzapine  10 mg HS. Continues Adderall XR 20 amd and has tried to reduce lorazepam to 1mg  TID Sleep is good.   Mood is improving.  Better funciton.  Anxiety is better with olanzapine. Still not herself and depression not gone with some anhedonia and social avoidance and feeling overwhelmed.  8/14 2023 received Spravato 84 mg 06/27/2022 received Spravato Spravato 84 mg 07/02/2022 received Spravato 84 mg 07/04/2022 received Spravato 84 mg  07/09/2022 appointment noted: Received Spravato 84 mg today as scheduled.  Tolerated it well without nausea or vomiting headache or chest pain or palpitations.  Expected dissociation and feels less depressed with resolution of negative emotions immediately after Spravato and then depression, anxiety creep back in. Continues meds Adderall XR 20 mg every morning, Wellbutrin XL 450 every morning, clonidine 0.1 mg twice daily, lorazepam 1 mg every 6 hours as needed, olanzapine increased from 7.5 to 10 mg nightly on  Tolerating meds.  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  She does note some increased appetite and is somewhat concerned about that but has not gained significant amounts of weight. She has had the TMS consultation which was initially denied but she knows it can be appealed.  However because she is improving with Spravato plus the other medications now she wants to continue the current treatment plan.  07/18/22 appt noted: Continues meds Adderall XR 20 mg every morning, Wellbutrin XL 450 every morning, clonidine 0.1 mg twice daily, lorazepam 1 mg every 6 hours as needed, olanzapine increased from 7.5 to 10 mg nightly on 07/04/2022. Received Spravato 84 mg today as scheduled.  Tolerated it well without nausea or vomiting headache or chest pain or palpitations.  Expected dissociation and feels less depressed with resolution of negative emotions immediately after Spravato and then depression, anxiety creep back in. Continues meds Adderall XR 20 mg every morning, Wellbutrin XL 450 every morning, clonidine 0.1 mg twice daily, lorazepam 1 mg every 6 hours as needed, olanzapine increased from 7.5 to 10 mg nightly on  Tolerating meds.  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  She does note some increased appetite and is somewhat concerned about that but has not gained significant amounts of weight. She has had the TMS consultation which was initially denied but she knows it can be appealed. She continues to have chronic ambivalence about psychiatric medicines and initially tends to blame her depressive symptoms such as decreased concentration and feeling flat on what ever medicine she currently is taking even though she had the same symptoms before the current medicines were started.  Then after discussion she does admit that her depressive symptoms are improved since adding  olanzapine but still has those residual symptoms noted.  07/23/22 received Spravato 84 mg   07/30/2022 appointment noted: Received Spravato 84 mg today as scheduled.  Tolerated it well without nausea or vomiting headache or chest pain or palpitations.  Expected dissociation and feels less depressed with resolution of negative emotions immediately after Spravato and then depression, anxiety creep back in. Continues meds Adderall XR 20 mg every morning, Wellbutrin XL 450 every morning, clonidine 0.1 mg twice daily, lorazepam 1 mg every 6 hours as needed, olanzapine increased from 7.5 to 10 mg nightly on  She has been inconsistent with olanzapine because she continues to be ambivalent about the medications in general and thinks that perhaps the 10 mg is making her feel blunted.  She continues to feel some depression.  She had a good day this week and but still feels somewhat depressed and persistently anxious. Plan: be consistent with olanzapine 10 mg HS for TRD and longer trial for potential benefit for anxiety.  Has not taken it consistently.  08/06/2022 appointment noted: Received Spravato 84 mg today as scheduled.  Tolerated it well without nausea or vomiting headache or chest pain or palpitations.  Expected dissociation and feels less depressed with resolution of negative emotions immediately after Spravato and then depression, anxiety creep back in. Continues meds Adderall XR 20 mg every morning, Wellbutrin XL 450 every morning, clonidine 0.1 mg twice daily, lorazepam 1 mg every 6 hours as needed, olanzapine i 10 mg nightly  She continues to feel depressed but is about 50% better with Spravato.  She is still not herself.  She still has anhedonia.  She still is not her able to engage socially in the typical ways.  She is not jovial and outgoing like normal.  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  She continues to feel that her personality is dampened down.  There is a question about whether it is related to depression or medication. Plan: continue olanzapine 10 for longer trial for TRD and severe anxiety.  08/13/22 appt noted:  Received Spravato 84 mg today as scheduled.  Tolerated it well without nausea or vomiting headache or chest pain or palpitations.  Expected dissociation and feels less depressed with resolution of negative emotions immediately after Spravato and then depression, anxiety creep back in. Continues meds Adderall XR 20 mg every morning, Wellbutrin XL 450 every morning, clonidine 0.1 mg twice daily, lorazepam 1 mg every 6 hours as needed, olanzapine i 10 mg nightly  She still does not feel herself.  Still struggles with depression and low motivation and reduced social engagement and reduced interest and reduced emotional expression.  She is somewhat better with the medicines plus Spravato.  She still believes the Spravato makes her blunted and  is not sure how much it helps her anxiety.  She can have good days when her family is around and she is engaged.  She still wants to stop the olanzapine. She has apparently continued to take the trazodone despite having been told to stop it when she started olanzapine.  She feels like she needs the trazodone. Plan: DC olanzapine and Start nortriptyline 25 mg nightly and build up to 75 mg nightly and then check blood level.    08/27/2022 appointment noted: Received Spravato 84 mg today as scheduled.  Tolerated it well without nausea or vomiting headache or chest pain or palpitations.  Expected dissociation and feels less depressed with resolution of negative emotions immediately after Spravato and then depression, anxiety creep back in. Continues meds Adderall XR 20 mg every morning, Wellbutrin XL 450 every morning, clonidine 0.1 mg twice daily, lorazepam 1 mg every 6 hours as needed. Stopped olanzapine and started  nortriptyline which she has taken for about a week is 75 mg nightly. So far she is tolerating the nortriptyline well with the exception of some dry mouth and constipation which she is working to manage.  She does not feel substantially better better or different off the olanzapine.  No change in her sleep which is good.  Main concern currently in addition to the residual depression is anxiety which is somewhat situational with pending arch show.  She is worrying about it more than normal.  Says she is having to take lorazepam twice a day where she had been able to keep reduce it prior to this.  She still does not feel like herself with residual depression with less social interest and less of her usual buoyancy in personality.  She is flatter than normal.  Overall she still feels that the Spravato has been helpful at reducing the severity of the depression.  She is not suicidal. She has not heard anything about the TMS appeal as of yet.  09/05/2022 appointment noted: Received Spravato 84 mg today as scheduled.  Tolerated it well without nausea or vomiting headache or chest pain or palpitations.  Expected dissociation and feels less depressed with resolution of negative emotions immediately after Spravato and then depression, anxiety creep back in. Continues meds Adderall XR 20 mg every morning, Wellbutrin XL 450 every morning, clonidine 0.1 mg twice daily, lorazepam 1 mg every 6 hours as needed. Stopped olanzapine and started nortriptyline which she has taken for about 2 week is 75 mg nightly. Initially blood pressure was a little high causing delay in starting Spravato.  She admitted to feeling a little wound up.  She still experiences a little increase in depression if she goes longer than a week in between doses of Spravato.  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  09/10/22 appt noted: Received  Spravato 84 mg today as scheduled.  Tolerated it well without nausea or vomiting headache or chest pain or palpitations.  Expected dissociation gradually resolved over the 2 hour observation period. She feels 50% less depressed with Spravto and wants to continue it.   Continues meds Adderall XR 20 mg every morning, Wellbutrin XL 450 every morning, clonidine 0.1 mg twice daily, lorazepam 1 mg every 6 hours as needed. Has started nortriptyline 75 mg nightly for about 3 weeks. Has not seen a significant difference with the addition of nortriptyline.  Tolerating it pretty well. She continues  to have some degree of anhedonia and significant depression and anxiety.  Her daughters noticed that she is more needy and calls more frequently.  She acknowledges this as well.  She is clearly still not herself. Plan: pramipexole off label and RX 0.25 mg BID  09/17/2022 appointment noted: Received Spravato 84 mg today as scheduled.  Tolerated it well without nausea or vomiting headache or chest pain or palpitations.  Expected dissociation gradually resolved over the 2 hour observation period. She feels 50% less depressed with Spravto and wants to continue it.   Continues meds Adderall XR 20 mg every morning, Wellbutrin XL 450 every morning, clonidine 0.1 mg twice daily, lorazepam 1 mg every 6 hours as needed. Has started nortriptyline 75 mg nightly for about 3 weeks and DT level 176, reduced to 50 mg HS early November. Still the same sx as noted last visit.  Tolerating meds.   Compliant.  Still depressed and family notices.  Has been able to participate in family interactions.  Some post-show let down and has to do detailed work which is hard for her bc ADD.  Sleep and eating well.  Energy OK but not great.  No SI.  Not cried in a year or so.  Clearly less depressed and hopeless than before the Spravato.  09/24/22 appt noted: Received Spravato 84 mg today as scheduled.  Tolerated it well without nausea or vomiting  headache or chest pain or palpitations.  Expected dissociation gradually resolved over the 2 hour observation period. She feels 50% less depressed with Spravto and wants to continue it.   Continues meds Adderall XR 20 mg every morning, Wellbutrin XL 450 every morning, clonidine 0.1 mg twice daily, lorazepam 1 mg every 6 hours as needed. Has started nortriptyline 75 mg nightly for about 3 weeks and DT level 176, reduced to 50 mg HS early November. Still the same sx as noted last visit.  Tolerating meds.   Compliant.  Still depressed and family notices.  Has been able to participate in family interactions.  Some post-show let down and has to do detailed work which is hard for her bc ADD.  Sleep and eating well.  Energy OK but not great.  No SI.  Not cried in a year or so.  Clearly less depressed and hopeless than before the Spravato. Is not making further progress generally.  Stuck with moderate depression  10/02/22 appt noted: Received Spravato 84 mg today as scheduled.  Tolerated it well without nausea or vomiting headache or chest pain or palpitations.  Expected dissociation gradually resolved over the 2 hour observation period. She feels 50% less depressed with Spravto and wants to continue it.   Continues meds Adderall XR 20 mg every morning, Wellbutrin XL 450 every morning, clonidine 0.1 mg twice daily, lorazepam 1 mg every 6 hours as needed. Has started nortriptyline 75 mg nightly for about 3 weeks and DT level 176, reduced to 50 mg HS early November. Still the same sx as noted last visit.  Tolerating meds.   Compliant.  Still depressed and family notices.  Has been able to participate in family interactions.  Some post-show let down and has to do detailed work which is hard for her bc ADD.  Sleep and eating well.  Energy OK but not great.  No SI.  Not cried in a year or so.  Clearly less depressed and hopeless than before the Spravato. Is not making further progress generally.  Stuck with moderate  depression.  Is  able to function pretty normally. Plan: trial pramipexole 0.25 mg BID off label for depression.   10/08/22 appt noted: Received Spravato 84 mg today as scheduled.  Tolerated it well without nausea or vomiting headache or chest pain or palpitations.  Expected dissociation gradually resolved over the 2 hour observation period. She feels 50% less depressed with Spravto and wants to continue it.   Continues meds Adderall XR 20 mg every morning, Wellbutrin XL 450 every morning, clonidine 0.1 mg twice daily, lorazepam 1 mg every 6 hours as needed. Has started nortriptyline 75 mg nightly for about 3 weeks and DT level 176, reduced to 50 mg HS early November. Still the same sx as noted last visit.  Tolerating meds.   Compliant.  Still depressed and family notices.  Has been able to participate in family interactions.  Some post-show let down and has to do detailed work which is hard for her bc ADD.  Sleep and eating well.  Energy OK but not great.  No SI.  Not cried in a year or so.  Clearly less depressed and hopeless than before the Spravato. Is not making further progress generally.  Stuck with moderate depression.  Behaved and felt pretty normally with family over for Thanksgiving. Doesn't see benefit or SE with pramipexole but thinks maybe it makes her worse. Plan:  increase pramipexole augmentation off label to 0.5 mg BID  10/15/2022 appointment noted: Received Spravato 84 mg today as scheduled.  Tolerated it well without nausea or vomiting headache or chest pain or palpitations.  Expected dissociation gradually resolved over the 2 hour observation period. She feels 50% less depressed with Spravto and wants to continue it.   Continues meds Adderall XR 20 mg every morning, Wellbutrin XL 450 every morning, clonidine 0.2 mg twice daily, lorazepam 1 mg every 6 hours as needed.  Trazodone 50 mg tablets 1-2 nightly as needed insomnia Has started nortriptyline 75 mg nightly for about 3 weeks  and DT level 176, reduced to 50 mg HS early November. Recommended increase pramipexole 0.5 mg twice daily off label for treatment resistant depression on 10/08/2022. She feels better motivated more active with pramipexole 0.5 mg twice daily.  She still is depressed but it is better.  We discussed the possibility of going up in the dose but did not change it.  10/22/2022 appointment noted: Received Spravato 84 mg today as scheduled.  Tolerated it well without nausea or vomiting headache or chest pain or palpitations.  Expected dissociation gradually resolved over the 2 hour observation period. She feels 50% less depressed with Spravto and wants to continue it.   Continues meds Adderall XR 20 mg every morning, Wellbutrin XL 450 every morning, clonidine 0.2 mg twice daily, lorazepam 1 mg every 8 hours as needed.  Trazodone 50 mg tablets 1-2 nightly as needed insomnia Has started nortriptyline 75 mg nightly for about 3 weeks and DT level 176, reduced to 50 mg HS early November. pramipexole 0.5 mg twice daily off label for treatment resistant depression on 10/08/2022. She is better motivated than she was.  She is journaling 3 pages a day.  She has started walking and has walked 5 days a week for 50 minutes for the last 2 weeks.  That is significantly helped her mood.  Her mood tends to be better when she interacts with family.  However she still has some periods of depression.  She is tolerating the medications well.  She still feels like her affect and confidence is not  back to normal. Plan:  increase pramipexole augmentation off label to 0.5 mg tID or 0.75 mg BID   10/29/22 appt noted: Received Spravato 84 mg today as scheduled.  Tolerated it well without nausea or vomiting headache or chest pain or palpitations.  Expected dissociation gradually resolved over the 2 hour observation period. She feels 50% less depressed with Spravto and wants to continue it.   Continues meds Adderall XR 20 mg every  morning, Wellbutrin XL 450 every morning, clonidine 0.2 mg twice daily, lorazepam 1 mg every 8 hours as needed.  Trazodone 50 mg tablets 1-2 nightly as needed insomnia Has started nortriptyline 75 mg nightly for about 3 weeks and DT level 176, reduced to 50 mg HS early November. pramipexole increased to 0.75 mg twice daily off label for treatment resistant depression  She has been feeling somewhat better with the increase in pramipexole and is tolerating it well.  She still is easily overwhelmed.  Her affect and mood can improve now when around her family or doing something positive.  She has been able to be more productive. She is tolerating the current medications. She is still considering TMS as an alternative to Spravato.  11/15/2022 appointment noted: Received Spravato 84 mg today as scheduled.  Tolerated it well without nausea or vomiting headache or chest pain or palpitations.  Expected dissociation gradually resolved over the 2 hour observation period. She feels 50% less depressed with Spravto and wants to continue it.   Continues meds Adderall XR 20 mg every morning, Wellbutrin XL 450 every morning, clonidine 0.2 mg twice daily, lorazepam 1 mg every 8 hours as needed.  Trazodone 50 mg tablets 1-2 nightly as needed insomnia Has started nortriptyline 75 mg nightly for about 3 weeks and DT level 176, reduced to 50 mg HS early November. pramipexole increased to 0.75 mg twice daily off label for treatment resistant depression  He has noticed some increase in depression due to the length of time since the last Spravato administration.  She believes Spravato is helping her.  sHe is not suicidal but has felt very blue the last few days. She is tolerating the medication. She is ambivalent about Spravato versus TMS but she is considering TMS. She wants to continue Spravato administration because it is clearly helpful.  11/19/22 appt noted: Received Spravato 84 mg today as scheduled.  Tolerated it well  without nausea or vomiting headache or chest pain or palpitations.  Expected dissociation gradually resolved over the 2 hour observation period. She feels 50% less depressed with Spravto and wants to continue it.   Continues meds Adderall XR 20 mg every morning, Wellbutrin XL 450 every morning, clonidine 0.2 mg twice daily, lorazepam 1 mg every 8 hours as needed.  Trazodone 50 mg tablets 1-2 nightly as needed insomnia Has started nortriptyline 75 mg nightly for about 3 weeks and DT level 176, reduced to 50 mg HS early November. pramipexole increased to 0.75 mg twice daily off label for treatment resistant depression  She has felt better back on Spravato more regularly.  However she still has residual depression esp when alone or when wihtout activity.  Can function when needed.   Does not have her connfidence back. She plans to pursue TMS availability. Have discussed retrying Auvelity  11/28/22 appt noted: Received Spravato 84 mg today as scheduled.  Tolerated it well without nausea or vomiting headache or chest pain or palpitations.  Expected dissociation gradually resolved over the 2 hour observation period. She feels 50% less  depressed with Spravto and wants to continue it.   Continues meds Adderall XR 20 mg every morning, Wellbutrin XL 450 every morning, clonidine 0.2 mg twice daily, lorazepam 1 mg every 8 hours as needed.  Trazodone 50 mg tablets 1-2 nightly as needed insomnia Has started nortriptyline 75 mg nightly for about 3 weeks and DT level 176, reduced to 50 mg HS early November. pramipexole increased to 0.75 mg twice daily off label for treatment resistant depression  Last week she was more depressed than usual for reasons that are not clear.  She has not had a clear answer from Filomena Jungling about [employer] options.  States that they have not returned her call.  She is asked about Auvelity retry in place of Wellbutrin.  Has still been walking.  12/03/22 appt noted: Received Spravato 84 mg today  as scheduled.  Tolerated it well without nausea or vomiting headache or chest pain or palpitations.  Expected dissociation gradually resolved over the 2 hour observation period. She feels 50% less depressed with Spravto and wants to continue it.   Continues meds Adderall XR 20 mg every morning, Wellbutrin XL 450 every morning, clonidine 0.2 mg twice daily, lorazepam 1 mg every 8 hours as needed.  Trazodone 50 mg tablets 1-2 nightly as needed insomnia started nortriptyline 75 mg nightly for about 3 weeks and DT level 176, reduced to 50 mg HS early November. pramipexole increased to 0.75 mg twice daily off label for treatment resistant depression  No SE .  Satisfied with meds. Depression is better in the last week.  Not sure why that is the case.  Still walking daily and that helps and journaling 3 pages daily.  Working on Biomedical scientist.  No changes desire.  Clearly benefits from Spravato but not 100%.  Still considering TMS.  12/10/22 appt noted: Received Spravato 84 mg today as scheduled.  Tolerated it well without nausea or vomiting headache or chest pain or palpitations.  Expected dissociation gradually resolved over the 2 hour observation period. She feels 50% less depressed with Spravto and wants to continue it.   Continues meds Adderall XR 20 mg every morning, Wellbutrin XL 450 every morning, clonidine 0.2 mg twice daily, lorazepam 1 mg every 8 hours as needed.  Trazodone 50 mg tablets 1-2 nightly as needed insomnia started nortriptyline 75 mg nightly for about 3 weeks and DT level 176, reduced to 50 mg HS early November. pramipexole increased to 0.75 mg twice daily off label for treatment resistant depression  No SE .   Depression was worse this week for no apparent reason.  She struggled being positive.  She has felt more discouraged.  She has felt more anxious.  She has had a hard time doing tasks.  She is interested in retrying the [place] which we had discussed previously. Plan: She agrees to  [employer].  To improve tolerability and reduce risk of side effects, Stop Wellbutrin and start Auvelity 1 in the morning for 1 week then 1 twice daily AndReduce pramipexole 0.5 mg BID and reduce nortriptyline to 50 mg HS  12/17/22 appt noted: Received Spravato 84 mg today as scheduled.  Tolerated it well without nausea or vomiting headache or chest pain or palpitations.  Expected dissociation gradually resolved over the 2 hour observation period. She feels 50% less depressed with Spravto and wants to continue it.   Continues meds Adderall XR 20 mg every morning, stopped Wellbutrin XL 150 every morning, clonidine 0.2 mg twice daily, lorazepam 1 mg every  8 hours as needed.  Trazodone 50 mg tablets 1-2 nightly as needed insomnia Has started nortriptyline 75 mg nightly for about 3 weeks and DT level 176, reduced to 50 mg HS early November. pramipexole 0.375 mg twice daily off label for treatment resistant depression with plan to stop Started Auvelity 1 AM Still depressed to moderate degree.  Tolerating Auvelity so far.  No other problems with meds.  Willing to give Auvelity a chance. Plan: She agrees to [employer].  Continue 1 AM and when tolerated then increase to BID Stop pramipexole.  12/26/22 received Spravato  01/01/23 appt noted: Received Spravato 84 mg today as scheduled.  Tolerated it well without nausea or vomiting headache or chest pain or palpitations.  Expected dissociation gradually resolved over the 2 hour observation period. She feels 50% less depressed with Spravto and wants to continue it.   Continues meds Adderall XR 20 mg every morning, stopped Wellbutrin XL 150 every morning, clonidine 0.2 mg twice daily, lorazepam 1 mg every 8 hours as needed.  Trazodone 50 mg tablets 1-2 nightly as needed insomnia Has started nortriptyline 75 mg nightly for about 3 weeks and DT level 176, reduced to 50 mg HS early November. Forgot to reduce pramipexole stoll taking  0.5 mg twice daily off  label for treatment resistant depression  Started Auvelity 1 AM & PM last week. Occ misses Spravato DT htn.this week a better than last.   Painting again more and it helps mood.  Confidence still low and not as likely to socialize as normal but enjoys family.   Still ambivalent about TMS. Tolerating meds.  Including the increase in [place].    01/08/23 appt noted: Received Spravato 84 mg today as scheduled.  Tolerated it well without nausea or vomiting headache or chest pain or palpitations.  Expected dissociation gradually resolved over the 2 hour observation period. She feels 50% less depressed with Spravto and wants to continue it.   Continues meds Adderall XR 20 mg every morning, stopped Wellbutrin XL 150 every morning, clonidine 0.2 mg twice daily, lorazepam 1 mg every 8 hours as needed.  Trazodone 50 mg tablets 1-2 nightly as needed insomnia Has started nortriptyline 75 mg nightly for about 3 weeks and DT level 176, reduced to 50 mg HS early November. reduced pramipexole to 0.5 mg daily off label for treatment resistant depression  Started Auvelity 1 AM & PM mid Feb. More depressed as week progresses.  Thinks it is worse with less pramipexole.  More negative.  Able to function but feels miserable.  No SI.  Hopeless.  Sleep ok.  Tolerating meds.   Talked to [facility] about TMS and appt mid March. Plan: increase pramipexole to 0.5 mg TID for dep bc seemed to worsen with the reduction.  01/15/23 appt noted: Received Spravato 84 mg today as scheduled.  Tolerated it well without nausea or vomiting headache or chest pain or palpitations.  Expected dissociation gradually resolved over the 2 hour observation period. She feels 50% less depressed with Spravto and wants to continue it.   Continues meds Adderall XR 20 mg every morning, stopped Wellbutrin XL 150 every morning, clonidine 0.2 mg twice daily, lorazepam 1 mg every 8 hours as needed.  Trazodone 50 mg tablets 1-2 nightly as needed  insomnia Has started nortriptyline 75 mg nightly for about 3 weeks and DT level 176, reduced to 50 mg HS early November. Increased pramipexole to 0.5 mg  to TID  off label for treatment resistant depression  Started Auvelity 1 AM & PM mid Feb. markedly better depression the increase in pramipexole.  She feels like her depression is almost resolved.  She is very pleased with the response.  She is tolerating the medications well  01/30/23 appt noted: Received Spravato 84 mg today as scheduled.  Tolerated it well without nausea or vomiting headache or chest pain or palpitations.  Expected dissociation gradually resolved over the 2 hour observation period. She feels 50% less depressed with Spravto and wants to continue it.   Continues meds Adderall XR 20 mg every morning, stopped Wellbutrin XL 150 every morning, clonidine 0.2 mg twice daily, lorazepam 1 mg every 8 hours as needed.  Trazodone 50 mg tablets 1-2 nightly as needed insomnia Has started nortriptyline 75 mg nightly for about 3 weeks and DT level 176, reduced to 50 mg HS early November. Increased pramipexole to 0.5 mg  to TID  off label for treatment resistant depression  Started Auvelity 1 AM & PM mid Feb. Mood markedly better with pramipexole added.  Nearly normal mood now with minimal depression.  More social and outgoing and motivated and resolved anhedonia. No SE.  Compliant.  02/04/23 appt noted: Continues meds Adderall XR 20 mg every morning, stopped Wellbutrin XL 150 every morning, clonidine 0.2 mg twice daily, lorazepam 1 mg every 8 hours as needed.  Trazodone 50 mg tablets 1-2 nightly as needed insomnia Has started nortriptyline 75 mg nightly for about 3 weeks and DT level 176, reduced to 50 mg HS early November. Increased pramipexole to 0.5 mg  to TID  off label for treatment resistant depression  Started Auvelity 1 AM & PM mid Feb. Received Spravato 84 mg today as scheduled.  Tolerated it well without nausea or vomiting headache  or chest pain or palpitations.  Expected dissociation gradually resolved over the 2 hour observation period. She feels 50% less depressed with Spravto and wants to continue it.  The pramipexole got her the rest of the way better.  Doesn't want to cut back on any tx bc afraid of relapse. Tolerating meds.  02/15/23 appt noted: Continues meds Adderall XR 20 mg every morning, stopped Wellbutrin XL 150 every morning, clonidine 0.2 mg twice daily, lorazepam 1 mg every 8 hours as needed.  Trazodone 50 mg tablets 1-2 nightly as needed insomnia Has started nortriptyline 75 mg nightly for about 3 weeks and DT level 176, reduced to 50 mg HS early November. Increased pramipexole to 0.5 mg  to TID  off label for treatment resistant depression  Started Auvelity 1 AM & PM mid Feb. Received Spravato 84 mg today as scheduled.  Tolerated it well without nausea or vomiting headache or chest pain or palpitations.  Expected dissociation gradually resolved over the 2 hour observation period. She feels no longer depressed.   The pramipexole got her the rest of the way better.  Doesn't want to cut back on any tx bc afraid of relapse. Tolerating meds.  02/18/23 appt noted: Continues meds Adderall XR 20 mg every morning, stopped Wellbutrin XL 150 every morning, clonidine 0.2 mg twice daily, lorazepam 1 mg every 8 hours as needed.  Trazodone 50 mg tablets 1-2 nightly as needed insomnia Has started nortriptyline 75 mg nightly for about 3 weeks and DT level 176, reduced to 50 mg HS early November. Increased pramipexole to 0.5 mg  to TID  off label for treatment resistant depression  Started Auvelity 1 AM & PM mid Feb. Received Spravato 84 mg today as scheduled.  Tolerated it well without nausea or vomiting headache or chest pain or palpitations.  Expected dissociation gradually resolved over the 2 hour observation period. She feels no longer depressed except when ran out of Auvelity this week DT lack of availability.  Much worse  without it..   The pramipexole got her the rest of the way better.  Doesn't want to cut back on any tx bc afraid of relapse. Tolerating meds.  02/25/23 appt: Received Spravato 84 mg today as scheduled.  Tolerated it well without nausea or vomiting headache or chest pain or palpitations.  Expected dissociation gradually resolved over the 2 hour observation period. Continues meds Adderall XR 20 mg every morning, stopped Wellbutrin XL 150 every morning, clonidine 0.2 mg twice daily, lorazepam 1 mg every 8 hours as needed.  Trazodone 50 mg tablets 1-2 nightly as needed insomnia Has started nortriptyline 75 mg nightly for about 3 weeks and DT level 176, reduced to 50 mg HS early November. Increased pramipexole to 0.5 mg  to TID  off label for treatment resistant depression  Started Auvelity 1 AM & PM mid Feb. No SE Still dramatically better with meds and Spravato.  Not 100% over depression. Had GS born last week and able to enjoy it now.  Is a little sleepy with pramipexole but manageable.  No change desired.  Sleep is ok.  03/04/23 appt noted: Received Spravato 84 mg today as scheduled.  Tolerated it well without nausea or vomiting headache or chest pain or palpitations.  Expected dissociation gradually resolved over the 2 hour observation period. Continues meds Adderall XR 20 mg every morning, stopped Wellbutrin XL 150 every morning, clonidine 0.2 mg twice daily, lorazepam 1 mg every 8 hours as needed.  Trazodone 50 mg tablets 1-2 nightly as needed insomnia nortriptyline reduced to 50 mg HS early November. Increased pramipexole to 0.5 mg  to TID  off label for treatment resistant depression  Started Auvelity 1 AM & PM mid Feb. Still generally doing well with meds and Spravato except when got tooth abscess.  Freels more depressed after dental surgery.   No SE with meds.   Sleep is OK.  No med changes desired  03/11/23 appt noted: Received Spravato 84 mg today as scheduled.  Tolerated it well  without nausea or vomiting headache or chest pain or palpitations.  Expected dissociation gradually resolved over the 2 hour observation period. Continues meds Adderall XR 20 mg every morning, stopped Wellbutrin XL 150 every morning, clonidine 0.2 mg twice daily, lorazepam 1 mg every 8 hours as needed.  Trazodone 50 mg tablets 1-2 nightly as needed insomnia nortriptyline reduced to 50 mg HS early November. Increased pramipexole to 0.5 mg  to TID  off label for treatment resistant depression  Started Auvelity 1 AM & PM mid Feb. Some increase depression with infection and needing amoxiciliin this week.  Otherwie still doing well with mood and meds. No med changes desired or indicated. No SE  03/21/2023 appointment noted: Continues meds Adderall XR 20 mg every morning, stopped Wellbutrin XL 150 every morning, clonidine 0.2 mg twice daily, lorazepam 1 mg every 8 hours as needed.  Trazodone 50 mg tablets 1-2 nightly as needed insomnia nortriptyline reduced to 50 mg HS early November. Increased pramipexole to 0.5 mg  to TID  off label for treatment resistant depression  Started Auvelity 1 AM & PM mid Feb. Received Spravato 84 mg today as scheduled.  Tolerated it well without nausea or vomiting headache or chest pain or  palpitations.  Expected dissociation gradually resolved over the 2 hour observation period. Depression is much better.  Pretty much resolved this week.  She feels the Auvelity and pramipexole have made the biggest difference.  Obviously the Spravato is also helping significantly.  She wants to continue all of these medications.  She still has anxiety easily.  Especially facing something that she would rather avoid.  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 Tolerating meds without significant side effects. Plan no med changes  03/29/23 appt noted: Continues meds Adderall XR 20 mg every morning, stopped  Wellbutrin XL 150 every morning, clonidine 0.2 mg twice daily, lorazepam 1 mg every 8 hours as needed.  Trazodone 50 mg tablets 1-2 nightly as needed insomnia, nortriptyline 50 mg HS,  pramipexole 0.5 mg  to TID  off label for treatment resistant depression  Started Auvelity 1 AM & PM mid Feb 2024 No SE Received Spravato 84 mg today as scheduled.  Tolerated it well without nausea or vomiting headache or chest pain or palpitations.  Expected dissociation gradually resolved over the 2 hour observation period. Depression continues to improve to a point of very mild sx.  Still hasn't gotten her confidence fully back.  She does enjoy social interactions and is productive at work. She is tolerating the meds well without side effects.  We discussed the possibility of reducing some of the medication given the marked response which was positive with pramipexole.  04/04/23 appt noted: Continues meds Adderall XR 20 mg every morning, stopped Wellbutrin XL 150 every morning, clonidine 0.2 mg twice daily, lorazepam 1 mg every 8 hours as needed.  Trazodone 50 mg tablets 1-2 nightly as needed insomnia, nortriptyline 50 mg HS,  pramipexole 0.5 mg  to TID  off label for treatment resistant depression  Started Auvelity 1 AM & PM mid Feb 2024 No SE Received Spravato 84 mg today as scheduled.  Tolerated it well without nausea or vomiting headache or chest pain or palpitations.  Expected dissociation gradually resolved over the 2 hour observation period. Dep is still much improved with increase in pramipexole.  Largely resolved.  Still gets anxious but takes less lorazepam prn but still needs it.  Sleep ok.  Tolerating meds.  04/10/23 appt noted: Continues meds Adderall XR 20 mg every morning, clonidine 0.2 mg twice daily, lorazepam 1 mg every 8 hours as needed.  Trazodone 50 mg tablets 1-2 nightly as needed insomnia, nortriptyline 50 mg HS,  pramipexole 0.5 mg  to TID  off label for treatment resistant depression  Started  Auvelity 1 AM & PM mid Feb 2024 No SE.  Satisfied with meds.   Received Spravato 84 mg today as scheduled.  Tolerated it well without nausea or vomiting headache or chest pain or palpitations.  Expected dissociation gradually resolved over the 2 hour observation period. Dep is still much improved with increase in pramipexole.  Largely resolved.  Still gets anxious but takes less lorazepam prn but still needs it.  Sleep ok.  Tolerating meds. Did an art show in another town; I couldn't have done this a year ago.    05/07/23 appt noted: Continues meds Adderall XR 20 mg every morning, clonidine 0.2 mg twice daily, lorazepam 1 mg every 8 hours as needed.  Trazodone 50 mg tablets 1-2 nightly as needed insomnia, nortriptyline 25-50 mg HS,  pramipexole 0.5 mg  to TID  off label for  treatment resistant depression  Started Auvelity 1 AM & PM mid Feb 2024 No SE.  Satisfied with meds.   Received Spravato 84 mg today as scheduled.  Tolerated it well without nausea or vomiting headache or chest pain or palpitations.  Expected dissociation gradually resolved over the 2 hour observation period. H says she is her old self.  Dep so much better.  Painting going well.  Anxiety in AM and then better.  Wakes with it.  Structured days more.  Negative self talk gone.  Wonderful to have relief.  Good weekend at a wedding.  Enjoys GS; [place].  More outspoken since the dep but not over the top.   More interest overall and satisfied wth resp.  Agrees to wean nortriptyline but doesn't want other med changes.  05/15/23 received Spravato 84  05/27/23 appt noted: Continues meds Adderall XR 20 mg every morning, clonidine 0.2 mg twice daily, lorazepam 1 mg every 8 hours as needed.  Trazodone 50 mg tablets 1-2 nightly as needed insomnia, pramipexole 0.5 mg  to TID  off label for treatment resistant depression  Started Auvelity 1 AM & PM mid Feb 2024 No SE.  Satisfied with meds.   Received Spravato 84 mg today as scheduled.  Tolerated  it well without nausea or vomiting headache or chest pain or palpitations.  Expected dissociation gradually resolved over the 2 hour observation period. Maybe a little more anxious re: weaning off nortriptyline.  Busy summer.  Extremely prolific painting.   Has a pending show and stressed over preparing.   Enjoys [employer].  Kept him yesterday for a couple of hours.   Dep has been like a cloak with physical sensation for a long time but is much better than it was.  Still not 100% all the time.  Struggles with some chronic guilt issues.  Self talk and CBT on herself yesterday had some. Still some fear about travelling and demands but so much better.   Making people laugh again and socially engaged and H agrees so much better. M has been impossible.  WC and immobile.  She wastes money.   Plan no med changes  06/03/23 appt noted: Meds as above.  No SE.  Satisfied with meds.   Received Spravato 84 mg today as scheduled.  Tolerated it well without nausea or vomiting headache or chest pain or palpitations.  Expected dissociation gradually resolved over the 2 hour observation period. Overall she feels better with Spravato.  She is minimally depressed generally when she is alone and not active but she is staying active socially and with her painting.  This is really helped with her depression and she is having more success.  She still has residual anxiety which she feels she needs to take lorazepam during the day.  She is attempting to minimize it but without much success.  She wants to continue the Spravato because of the dramatic improvement she is seen with this and the medications.  06/11/23 appt noted: Meds as above.  No SE.  Satisfied with meds.   Received Spravato 84 mg today as scheduled.  Tolerated it well without nausea or vomiting headache or chest pain or palpitations.  Expected dissociation gradually resolved over the 2 hour observation period. Still has some anxiety wihtout reason an benefit  lorazepam and usually only 1 mg daily.  Makes her sleepy if takes it in the day. Sees sig benefit with pramipexole 0.5 mg TID and consistent with it. Mood is still good overall.  But bc mother still can worry about getting older.  Feels too scared about it but working on it. No concerns with meds.    06/20/23 appt noted:  Continues meds Adderall XR 20 mg every morning, clonidine 0.2 mg twice daily, lorazepam 1 mg every 8 hours as needed.  Trazodone 50 mg tablets 1-2 nightly as needed insomnia, pramipexole 0.5 mg  to TID  off label for treatment resistant depression  Started Auvelity 1 AM & PM mid Feb 2024 No SE.  Satisfied with meds.  Needs each as written. Received Spravato 84 mg today as scheduled.  Tolerated it well without nausea or vomiting headache or chest pain or palpitations.  Expected dissociation gradually resolved over the 2 hour observation period. Marked benefit Spravato and meds remain.  No manic sx.  Residual anxiety is gradually improving.  Sleep is ok. Some residual anxiey at time.  Enjoys GS and family and functioning fine. Plan no med changes  06/25/23 appt noted: Continues meds Adderall XR 20 mg every morning, clonidine 0.2 mg twice daily, lorazepam 1 mg every 8 hours as needed.  Trazodone 50 mg tablets 1-2 nightly as needed insomnia, pramipexole 0.5 mg  to TID  off label for treatment resistant depression  Started Auvelity 1 AM & PM mid Feb 2024 No SE.  Satisfied with meds.  Needs each as written. Received Spravato 84 mg today as scheduled.  Tolerated it well without nausea or vomiting headache or chest pain or palpitations.  Expected dissociation gradually resolved over the 2 hour observation period. Marked benefit Spravato and meds remain.  Minimal depression at this time. No manic sx.  Residual anxiety is gradually improving and she is using much less lorazepam.  Sleep is ok. Some residual anxiey at time.  Enjoys GS and family and functioning fine. Plan no med  changes  07/04/23 appt noted: Continues meds Adderall XR 20 mg every morning, clonidine 0.2 mg twice daily, lorazepam 1 mg every 8 hours as needed.  Trazodone 50 mg tablets 1-2 nightly as needed insomnia, pramipexole 0.5 mg  to TID  off label for treatment resistant depression  Started Auvelity 1 AM & PM mid Feb 2024 No SE.  Satisfied with meds.  Needs each as written. Received Spravato 84 mg today as scheduled.  Tolerated it well without nausea or vomiting headache or chest pain or palpitations.  Expected dissociation gradually resolved over the 2 hour observation period.  Good experience today. Dealing with stress Of having to place her into a nursing facility.  However she has handled it well.  Patient has been able to take over responsibilities in the family that she could not have done prior to [facility] and current meds.  Her depression is in remission.  She she is not highly anxious usually but still needs lorazepam in the evening.  No problems or concerns with meds.  ADD is well managed with the Adderall.  She is productive and enjoying life including working family.  07/11/23 appt noted: Continues meds Adderall XR 20 mg every morning, clonidine 0.2 mg twice daily, lorazepam 1 mg every 8 hours as needed.  Trazodone 50 mg tablets 1-2 nightly as needed insomnia, pramipexole 0.5 mg  to TID  off label for treatment resistant depression  Started Auvelity 1 AM & PM mid Feb 2024 No SE.  Satisfied with meds.  Needs each as written. Received Spravato 84 mg today as scheduled.  Tolerated it well without nausea or vomiting headache or chest pain or palpitations.  Expected dissociation gradually resolved over the 2 hour observation period.  Good experience today. Depression remained under control with remission.  She is able to enjoy things normally that she could not enjoy last year.  She is able to handle stressors that she could not handle last year such as dealing with her mother's declining health and  the family issues that that brings up.  That has created more anxiety and she needs the lorazepam lately.  She had been avoiding it for the most part until these issues.  No changes desired.   ECT-[facility]    Flowsheet Row Clinical Support from 07/11/2023 in [facility] [facility] Clinical Support from 01/08/2023 in [facility] Clinical Support from 08/06/2022 in [facility] Clinical Support from 07/04/2022 in [facility] Clinical Support from 05/21/2022 in [facility]  MADRS Total Score 9 21 29 15 27       Past Psychiatric Medication Trials: fluoxetine, duloxetine, Viibryd, Pristiq, sertraline, citalopram,  Trintellix anxious and SI Wellbutrin XL 450 Auvelity 1 dose nortriptyline 75 mg nightly for about 3 weeks and DT level 176, reduced to 50 mg HS NR. Pramipexole 1 mg NR Adderall, Adderall XR, Vyvanse, Ritalin, Strattera low dose NR Lorazepam Trazodone  Depakote,  lamotrigine cog complaints Lithium remotely Abilify 7.5  Vraylar 1.5 mg daily agitation and insomnia Rexulti insomnia Latuda 40 one dose, CO anxious and SI Seroquel XR 300 Olanzapine 10  At visit November 12, 2019. We discussed Patient developed an increasingly severe alcohol dependence problem since her last visit in January.  She went to [facility] and has had no alcohol abuse since .  She never abused stimulants but they took her off the stimulants at [facility].  Her ADD was markedly worse.  The Wellbutrin did not help the ADD.   D history lamotrigine rash at 67 yo  Review of Systems:  Review of Systems  Musculoskeletal:  Positive for back pain. Negative for arthralgias.       SP hip surgery October 2020  Neurological:  Negative for tremors.  Psychiatric/Behavioral:  Negative for agitation, behavioral problems, confusion, decreased concentration, dysphoric mood, hallucinations,  self-injury, sleep disturbance and suicidal ideas. The patient is nervous/anxious. The patient is not hyperactive.        Forgetful at times about med recommendations but better now    Medications: I have reviewed the patient's current medications.  Current Outpatient Medications  Medication Sig Dispense Refill   amLODipine (NORVASC) 2.5 MG tablet Take 2.5 mg by mouth daily.     amphetamine-dextroamphetamine (ADDERALL XR) 20 MG 24 hr capsule Take 1 capsule (20 mg total) by mouth every morning. 30 capsule 0   amphetamine-dextroamphetamine (ADDERALL XR) 20 MG 24 hr capsule Take 1 capsule (20 mg total) by mouth every morning. 30 capsule 0   cloNIDine (CATAPRES) 0.2 MG tablet TAKE 1 TABLET BY MOUTH TWICE A DAY 180 tablet 0   Dextromethorphan-buPROPion ER (AUVELITY) 45-105 MG TBCR Take 1 tablet by mouth 2 (two) times daily. 60 tablet 3   Esketamine HCl, 84 MG Dose, (SPRAVATO, 84 MG DOSE,) 28 MG/DEVICE SOPK USE 3 SPRAYS IN EACH NOSTRIL ONCE A WEEK 3 each 2   iron polysaccharides (NIFEREX) 150 MG capsule TAKE 1 CAPSULE BY MOUTH EVERY DAY 90 capsule 1   losartan (COZAAR) 50 MG tablet Take 50 mg by mouth daily.     nebivolol (BYSTOLIC) 2.5 MG tablet Take 2.5 mg by mouth daily.  pramipexole (MIRAPEX) 0.5 MG tablet Take 1 tablet (0.5 mg total) by mouth 3 (three) times daily. 270 tablet 1   traZODone (DESYREL) 50 MG tablet TAKE 1-2 TABLETS BY MOUTH NIGHTLY AS NEEDED FOR SLEEP 180 tablet 1   LORazepam (ATIVAN) 1 MG tablet Take 1 tablet (1 mg total) by mouth every 8 (eight) hours as needed. for anxiety 90 tablet 1   No current facility-administered medications for this visit.    Medication Side Effects: None  Allergies:  Allergies  Allergen Reactions   Metronidazole Shortness Of Breath and Other (See Comments)    Heart pounding   Ferrlecit [Na Ferric Gluc Cplx In Sucrose] Other (See Comments)    Infusion reaction 05/12/2019    Past Medical History:  Diagnosis Date   ADHD    Anemia     Anxiety    Arthritis    Depression    Heart murmur    i went to see a cardiologit slast eyar  and i had zero plaque,    PONV (postoperative nausea and vomiting)    Recovering alcoholic in remission (HCC)     Family History  Problem Relation Age of Onset   Atrial fibrillation Mother    CAD Father     Past Medical History, Surgical history, Social history, and Family history were reviewed and updated as appropriate.   Please see review of systems for further details on the patient's review from today.   Objective:   Physical Exam:  There were no vitals taken for this visit.  Physical Exam Constitutional:      General: She is not in acute distress. Neurological:     Mental Status: She is alert and oriented to person, place, and time.     Coordination: Coordination normal.     Gait: Gait normal.  Psychiatric:        Attention and Perception: Attention and perception normal.        Mood and Affect: Mood is anxious. Mood is not depressed. Affect is not tearful.        Speech: Speech is not rapid and pressured.        Behavior: Behavior is not hyperactive.        Thought Content: Thought content is not paranoid or delusional. Thought content does not include homicidal or suicidal ideation. Thought content does not include suicidal plan.        Cognition and Memory: Cognition normal. Memory is not impaired.        Judgment: Judgment normal.     Comments: Insight intact. No auditory or visual hallucinations. No delusions.  No depression at this time and very positive affect and expressive normally for her. No manic sx     Lab Review:     Component Value Date/Time   NA 137 01/12/2021 1430   NA 140 11/18/2018 1544   K 3.8 01/12/2021 1430   CL 108 01/12/2021 1430   CO2 22 01/12/2021 1430   GLUCOSE 94 01/12/2021 1430   BUN 14 01/12/2021 1430   BUN 20 11/18/2018 1544   CREATININE 0.82 01/12/2021 1430   CALCIUM 8.9 01/12/2021 1430   PROT 6.6 01/12/2021 1430   ALBUMIN 3.9  01/12/2021 1430   AST 12 (L) 01/12/2021 1430   ALT 11 01/12/2021 1430   ALKPHOS 46 01/12/2021 1430   BILITOT 0.5 01/12/2021 1430   GFRNONAA >60 01/12/2021 1430   GFRAA >60 09/02/2019 0249   GFRAA >60 01/27/2019 0811       Component Value  Date/Time   WBC 4.5 01/12/2021 1430   RBC 4.32 01/12/2021 1430   HGB 12.8 01/12/2021 1430   HGB 12.9 07/17/2019 0953   HCT 38.5 01/12/2021 1430   HCT 21.9 (L) 12/25/2018 1221   PLT 272 01/12/2021 1430   PLT 286 07/17/2019 0953   MCV 89.1 01/12/2021 1430   MCH 29.6 01/12/2021 1430   MCHC 33.2 01/12/2021 1430   RDW 12.4 01/12/2021 1430   LYMPHSABS 1.4 01/12/2021 1430   MONOABS 0.4 01/12/2021 1430   EOSABS 0.0 01/12/2021 1430   BASOSABS 0.0 01/12/2021 1430    No results found for: "POCLITH", "LITHIUM"   No results found for: "PHENYTOIN", "PHENOBARB", "VALPROATE", "CBMZ"   .res Assessment: Plan:    Recurrent major depression resistant to treatment (HCC)  Generalized anxiety disorder - Plan: LORazepam (ATIVAN) 1 MG tablet  Attention deficit hyperactivity disorder (ADHD), predominantly inattentive type  Insomnia due to mental condition - Plan: LORazepam (ATIVAN) 1 MG tablet   She has treatment resistant major depression  with 100% better with Spravato.  Previously  discussed some of her  abnormal behaviors last year leading to this depressive episode getting worse which she says were associated with heavy use of delta 8 and not a manic episode.  She realizes now that that was not good for her.  She stopped all use of other drugs including those available over-the-counter such as delta 8 or any other THC related products.  She is no longer having any of those types of behaviors and instead is depressed.  Depression nearly resolved with pramipexole TID and relapsed when out of Auvelity. Anhedonia resolved.  Depression resolved recently after increasing pramipexole 0.5 mg TID, brief relapse when out of Auvelity Consider switch to ER to  reduce sleepiness  Patient was administered Spravato 84 mg intranasally today.  The patient experienced the typical dissociation which gradually resolved over the 2-hour period of observation.  There were no complications.  Specifically the patient did not have nausea or vomiting or headache.  Blood pressures monitored at the 40-minute and 2-hour follow-up intervals.  Borderline high.  By the time the 2-hour observation period was met the patient was alert and oriented and able to exit without assistance.  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  See nursing note for further details.She wants to continue Spravato. We discussed discussed the side effects in detail as well as the protocol required to receive Spravato.    has failed all major categories of antidepressants MAO inhibitors which have not been tried.  She is tolerating Auvelity.  Continue 1  BID bc more depressed without it dramatically.  Relapsed witout it.  Worse with less pramipexole. Increased to 0.5 mg TID on about end of Feb 2024 and markedly better Dosing range off label for depression ranges from 1-5 mg daily.  Disc risk compulsive impulsive behavior and mania.    Started Spravato 84 mg twice weekly on 03/16/2022.  Wants to continue weekly and feels she needs it.   Disc risk relapse if less than weekly and she doesn't seem to do well with less than weekily.  Adderall  XR 20 mg AM   Ativan 1 mg 3 times daily as needed anxiety but try to cut it back.  She still feels she needs it but only at the family stress.  Otherwise she has been able to avoid it. Is not ideal to use benzodiazepine with stimulant but because  of the severity of her symptoms it has been necessary.  Hope to eventually eliminate the benzodiazepine.  Encourage her to embrace this goal.    Continue clonidine 0.2 mg BID off label for anxiety and helps BP partially. BP is better  controlled and more consistent.Rec keep track of BP and discuss with PCP. It has been up and down.  Sometimes needs extra clonidine before Spravato.  Better lately.  Discussed potential benefits, risks, and side effects of stimulants with patient to include increased heart rate, palpitations, insomnia, increased anxiety, increased irritability, or decreased appetite.  Instructed patient to contact office if experiencing any significant tolerability issues. She wants to return to usual dose of Adderall for ADD bc of mor poor cognitive function with reduction.  Also discussed that depression will impair cognitive function.  Disc risk polypharmacy.  Relapsed after running out of Auvelity so needs clearly it and pramipexole.   She continues walkly daily with benefit. And journaling.   Has Maintained sobriety.    No med changes indicated today: Continue Adderall XR 20 mg every morning Continue Auvelity 1 tablet twice daily,  continue clonidine 0.2 mg twice daily for anxiety and blood pressure. Continue lorazepam 1 mg 3 times daily she has been unable to cut back on the dose due to anxiety. Continue pramipexole 0.5 mg 3 times daily off label for dep; effective. Continue trazodone 50 mg tablets 1-2 nightly as needed for sleep.  FU with Spravato weekly   Meredith Staggers, MD, DFAPA     Please see After Visit Summary for patient specific instructions.  No future appointments.                       No orders of the defined types were placed in this encounter.      -------------------------------

## 2023-07-11 NOTE — Progress Notes (Signed)
NURSES NOTE:   Patient arrived for her weekly 84mg  Spravato treatment. Pt is being treated for Treatment Resistant Depression this is #74 treatment, pt will be receiving 84 mg which will continue to be her maintenance dose, she receives weekly treatments.  Patient arrived and taken to treatment room. Confirmed she had a ride home which is her husband would be coming back to pick up pt when done and sometimes she needs to use Benedetto Goad if he is unable to pick her up. Pt's Spravato is ordered through [employer] and delivered to office, all Spravato medication is stored at doctors office per REMS/FDA guidelines. The medication is required to be locked behind two doors per FDA/REMS Protocol. Medication is also disposed of properly per regulations. Pt's prior authorization for Spravato 84 mg was renewed with an approval through 07/17/2023 with [employer]. All treatments with vital signs documented with Spravato REMS per protocol of being a treatment center.    Began taking patient's vital signs at 9:45 AM 133/80, pulse 55. Pulse Ox 99%. Gave patient first dose 28 mg nasal spray, each nasal spray administered in each nostril as directed and waited 5 minutes between the second and third dose. All 3 doses given pt did not complain of any nausea/vomiting, given a cup of water due to the taste after the administration of Spravato.  She listens to music on her phone today. Checked 40 minute vitals at 10:30 AM, 138/89, pulse 56, Pulse Ox 92%. Explained she would be monitored for a total time of 120 minutes. Discharge vitals were taken at 11:35 AM 137/98 P 53, 99% Pulse Ox.  Dr. Jennelle Human met with pt and discussed her treatment. Recommend she go home and sleep or just relax on the couch. No driving, no intense activities. Verbalized understanding. Nurse was with pt a total of 70 minutes for clinical. Pt will be scheduled next week and let me know what day works best.    Levora Dredge [id number] EXP [id number]

## 2023-07-18 ENCOUNTER — Encounter: Payer: Self-pay | Admitting: Hematology

## 2023-07-22 ENCOUNTER — Encounter: Payer: 59 | Admitting: Psychiatry

## 2023-07-22 NOTE — Progress Notes (Unsigned)
NURSES NOTE:  Pt arrived for Spravato Treatment but admin staff reports she has a change in her insurance. Pt does not have the new cards with her and pt will also need a new authorization to continue treatments. Pt was canceled and instructed to call back with her information.

## 2023-07-23 ENCOUNTER — Encounter: Payer: Self-pay | Admitting: Hematology

## 2023-07-26 ENCOUNTER — Telehealth: Payer: Self-pay

## 2023-07-26 NOTE — Telephone Encounter (Signed)
Prior Authorization submitted and approved with pharmacy benefits for Spravato 84 mg with [employer] through 11/12/2023, PA# [id number].   Will notify [facility] so they can run through her new insurance. She should be able to come back next week.

## 2023-07-31 ENCOUNTER — Other Ambulatory Visit: Payer: Self-pay

## 2023-07-31 ENCOUNTER — Other Ambulatory Visit: Payer: Self-pay | Admitting: Psychiatry

## 2023-07-31 ENCOUNTER — Telehealth: Payer: Self-pay | Admitting: Psychiatry

## 2023-07-31 DIAGNOSIS — F900 Attention-deficit hyperactivity disorder, predominantly inattentive type: Secondary | ICD-10-CM

## 2023-07-31 DIAGNOSIS — F339 Major depressive disorder, recurrent, unspecified: Secondary | ICD-10-CM

## 2023-07-31 MED ORDER — AMPHETAMINE-DEXTROAMPHET ER 20 MG PO CP24: 20.0000 mg | ORAL_CAPSULE | ORAL | 0 refills | Status: DC

## 2023-07-31 MED ORDER — AUVELITY 45-105 MG PO TBCR: 1.0000 | EXTENDED_RELEASE_TABLET | Freq: Two times a day (BID) | ORAL | 3 refills | Status: DC

## 2023-07-31 NOTE — Telephone Encounter (Signed)
Do you want her to set up an apt for follow up?

## 2023-07-31 NOTE — Telephone Encounter (Signed)
Laura Chang called in today. She will need her Auvelity RF and Adderall XR RF.  She just took her last one. She has new [facility] Medicare plan and I'm sure she will need a PA for each of these, if not all of her meds.  Can we give her samples of Auvelity in the meantime?

## 2023-07-31 NOTE — Telephone Encounter (Signed)
FYI- Spravato- Pt has switched to [facility] Medicare. Can no longer afford the Spravato. Cost is $445 per fill and cannot get patient assistance with her income bracket. Angelique Blonder said that she feels she is doing okay and can continue on her current meds.

## 2023-07-31 NOTE — Telephone Encounter (Signed)
I will send her Rx's over and I let her know I do have samples of Auvelity if she needs them.

## 2023-07-31 NOTE — Telephone Encounter (Signed)
Yes pls schedule her for next available regular appt.

## 2023-07-31 NOTE — Telephone Encounter (Signed)
Thank you :)

## 2023-08-02 NOTE — Telephone Encounter (Signed)
Called pt at 10:20a.  She has Dr Cottle's first available appt, which is 10/21 and has been added to the wait list.

## 2023-08-26 ENCOUNTER — Other Ambulatory Visit: Payer: Self-pay | Admitting: Psychiatry

## 2023-08-26 DIAGNOSIS — I10 Essential (primary) hypertension: Secondary | ICD-10-CM

## 2023-08-26 DIAGNOSIS — F411 Generalized anxiety disorder: Secondary | ICD-10-CM

## 2023-08-26 NOTE — Telephone Encounter (Signed)
Pt has appt tomorrow. LV (Spravato) 08/29 LF 07/23/23

## 2023-08-27 ENCOUNTER — Ambulatory Visit: Payer: Medicare Other | Admitting: Psychiatry

## 2023-08-29 ENCOUNTER — Telehealth: Payer: Self-pay | Admitting: Psychiatry

## 2023-08-29 ENCOUNTER — Other Ambulatory Visit: Payer: Self-pay

## 2023-08-29 DIAGNOSIS — F900 Attention-deficit hyperactivity disorder, predominantly inattentive type: Secondary | ICD-10-CM

## 2023-08-29 MED ORDER — AMPHETAMINE-DEXTROAMPHET ER 20 MG PO CP24: 20.0000 mg | ORAL_CAPSULE | ORAL | 0 refills | Status: DC

## 2023-08-29 NOTE — Telephone Encounter (Signed)
Laura Chang called and LM at 11:50 requesting a refill of her Adderall DR 20mg .  Appt 10/21.  I called her back to find out with pharmacy. She said send it to [id number] - [place], [place] - [address]

## 2023-08-29 NOTE — Telephone Encounter (Signed)
pended

## 2023-09-02 ENCOUNTER — Ambulatory Visit: Payer: Medicare Other | Admitting: Psychiatry

## 2023-09-02 ENCOUNTER — Encounter: Payer: Self-pay | Admitting: Psychiatry

## 2023-09-02 DIAGNOSIS — F411 Generalized anxiety disorder: Secondary | ICD-10-CM | POA: Diagnosis not present

## 2023-09-02 DIAGNOSIS — F5105 Insomnia due to other mental disorder: Secondary | ICD-10-CM

## 2023-09-02 DIAGNOSIS — F339 Major depressive disorder, recurrent, unspecified: Secondary | ICD-10-CM | POA: Diagnosis not present

## 2023-09-02 DIAGNOSIS — F900 Attention-deficit hyperactivity disorder, predominantly inattentive type: Secondary | ICD-10-CM

## 2023-09-02 MED ORDER — AMPHETAMINE-DEXTROAMPHET ER 20 MG PO CP24
20.0000 mg | ORAL_CAPSULE | ORAL | 0 refills | Status: DC
Start: 2023-09-30 — End: 2023-10-24

## 2023-09-02 MED ORDER — AMPHETAMINE-DEXTROAMPHET ER 20 MG PO CP24: 20.0000 mg | ORAL_CAPSULE | ORAL | 0 refills | Status: DC

## 2023-09-02 MED ORDER — BUPROPION HCL ER (SR) 100 MG PO TB12
100.0000 mg | ORAL_TABLET | Freq: Two times a day (BID) | ORAL | 1 refills | Status: DC
Start: 2023-09-02 — End: 2023-09-18

## 2023-09-02 MED ORDER — DEXTROMETHORPHAN HBR 15 MG PO TABS
45.0000 mg | ORAL_TABLET | Freq: Two times a day (BID) | ORAL | 1 refills | Status: DC
Start: 2023-09-02 — End: 2023-10-24

## 2023-09-02 MED ORDER — PRAMIPEXOLE DIHYDROCHLORIDE 0.5 MG PO TABS
0.5000 mg | ORAL_TABLET | Freq: Three times a day (TID) | ORAL | 1 refills | Status: DC
Start: 2023-09-02 — End: 2023-10-24

## 2023-09-02 NOTE — Progress Notes (Signed)
[name] [id number] 1956/10/17 67 y.o.  Subjective:   Patient ID:  Laura Chang is a 68 y.o. (DOB April 28, 1956) female.  Chief Complaint:  Chief Complaint  Patient presents with   Follow-up   Depression   ADD   Anxiety      Laura Chang presents to the office today for follow-up of depression and anxiety and ADD.  seen November 12, 2019.  Melted down in 10-09-19.  Went to [employer] in July.  No withdrawal.  1 drink since then.  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 She was on no stimulant and no SSRI but was taking Strattera and Wellbutrin.  The following changes were made. Stop Strattera. OK restart stimulant bc severe ADD Restart Adderall 1 daily for a few days and if tolerated then restart 1 twice daily. If not tolerated reduce the dosage if needed. May need to stop Wellbutrin if not tolerating the stimulant.  Yes.  DC Wellbutrin Restart Prozac 20 mg daily.  February 2021 appointment with the following noted: Completed grant proposal.  Couldn't doit without Adderall.  Sold a bunch of work.   Adderall XR lasts about 3 pm.  Strength seems about right.  BP been OK.  Not jittery.   Stopped Wellbutrin but had no SE. Mood drastically better with grant proposal and back on fluoxetine.  Less depressed and lethargic.  No anxiety.  Cut back on coffee. Started back with devotions and stronger faith. Plan: Continue Prozac 20 mg daily. May have to increase the dose at some point in the future given that she usually was taking higher dosages but she is getting good response at this time. Restart Wellbutrin off label for ADD since can't get 2 ADDERALL daily. 150 mg daily then 300 mg daily. She can adjust the dose between 150 mg and 300 mg daily to get the optimal effect.   05/11/2020 appointment with the following noted: Has been inconsistent with Prozac and Wellbutrin. Not sure of the effect of Wellbutrin. Biggest deterrent in work is anxiety.   Some of the work is conceptual and difficult at times.  Can feel she's not up to a project at times.  Overall is OK but would like a steadier benefit from stimulant.  Exhausted from managing concentration and keeping up with things from the day.  Loses things.  Not good keeping up with schedule. Overall productive and emotionally OK. Can feel Adderall wear off. Mood is better in summer and worse in the winter.   F died in 2023-10-09 and that is a loss. No SE Wellbutrin. Still attends AA meetings.  Real benefit from [facility] last year. Recognizes effect of anemia on ADD and mood.  Had iron infusions last winter. Plan:  Wellbutrin off label for ADD since can't get 2 ADDERALL daily. 150 mg daily then 300 mg daily.  01/24/2021 appointment with following noted: Doing a program called [employer] mindfulness app since Xmas.  CBT app helped the depression.  App helped her focus better.  Lost sign weight. Writing a lot. Before Xmas felt depressed and started negative thinking worse, self denigrating. Not drinking. More isolated.   Recognizes mo is narcissist.    Didn't tell anyone she was born until 3 mos later.  M aloof and uninterested in pt.  Lied about her birthday.  Mo lack of affection even with pt's kids. Going to AA for a year and it helped her to quit drinking. Also misses kids being gone  with a hole also. Plan: No med changes  05/04/2021 appointment with the following noted: Therapist [name] thinks she's manic. Lost weight to 144#.   States she is still sleeping okay.  Admits she is hyper and recognizes that she is likely manic.  She feels great, euphoric with an increased sense of spiritual connectedness to God.  She has racing thoughts and talks fast and talks a lot and this is noted by her husband.  He thinks she is a bit hyper.  She has been able to maintain sobriety although she will have 1 glass of wine on special occasions but does not drink by herself.  She is not drinking to  excess.  She denies any dangerous impulsivity.  She is clearly not depressed and not particularly anxious.  She has no concerns about her medication and she has been compliant.  06/16/21 appt noted: So much better.  Going through a lot but the manic thing happened on top of it.  So much slower.  Didn't feel like losing anything with risperidone.  Likes the Adderalll at 10 mg. Some drowsiness in the AM and very drowsy from risperidone 2 mg HS. Prayer life is better. Handling stress better. Less depressed with risperidone. Still likes trazodone. Sleeps well. Plan: Reduce Prozac to 10 mg daily.  Consider stopping it because it can feel the mania however she is reluctant to do that because she fears relapse of depression. Reduce risperidone to 1.5 mg nightly due to side effects.  Discussed risk of worsening mania.  07/25/2021 appointment with the following noted: Misses the Adderall and hard to function without it. Depressed now. Heavy chest.  Anxious and guilty.  Body feels heavy.   Hates Wellbutrin.   Plan: Increase fluoxetine to 20 mg daily Add Abilify 1/2 of 15 mg tablet daily Wean wellbutrin by 1 tablet each week  bc she feels it is not helpful and DT polypharmacy Reduce risperidone to 1 daily for 1 week and stop it. Disc risk of mania. Increase Adderall to XR 20 mg AM  08/08/21 Much less depressed and starting to feel normal I feel a lot better. No SE.  Speech normal off risperidone. Sleeping OK on trazaodone and enough.   Noticed benefit from Adderall again. Plan: continue fluoxetine to 20 mg daily Continue Abilify 1/2 of 15 mg tablet daily for depression and mania continue Adderall to XR 20 mg AM  10/10/2021 phone call: Pt stated she feels like the Abilify should be decreased to 5mg .She said she is depressed but rational and not suicidal.She has an appt Monday and can wait until then if you prefer. MD response: Reduce the Abilify to 7.5 mg every other day.  We will meet on 10/16/2021  and decide what to do from there.  10/16/2021 appointment with the following noted: More depressed.  Most depressed I've ever been.  Just numb.  Sense of grief.   Thinks the manic episode was unlike anything else she ever had.  Doesn't want to medicate against it.  Don't enjoy people.  Easily overwhelmed.  Had some death thoughts but not suicidal.  Has been functional.  Feels better today after reducing Abilify to every other day but she is only been doing that for 3 days. A/P: Episode of post manic depression was explained. continue fluoxetine to 20 mg daily Hold Abilify for 1 week then resume Abilify 1/2 of 15 mg tablet every other day for depression and mania continue Adderall to XR 20 mg AM  10/27/2021  appointment with the following noted: I'm doing so much better.  Handling the depression better. Better self talk and spiritual focus has helped.   Dep 6/10 manifesting as anxiety with low confidence.   F died 2  years ago and M 67 yo and is dependent . She is working hard to feel better but still feels depressed.  She almost feels like she has a little more anxiety since restarting Abilify every other day. Plan: continue fluoxetine to 20 mg daily DC Abilify .  Vrayalar 1.5 mg QOD to try to get rid of depression ASAP. continue Adderall to XR 20 mg AM  11/10/2021 appointment with the following noted: Busy with Xmas and it was fun with family but then a big let down.  Did well with it.  Functioned well with it.  Working hard on things with depression.  Not shutting down. Not sure but feels better today but yesterday was hard.  Difficulty dealing with mother.  She won't do anything to help herself.  Yesterday with her all day.  Won't do PT and has isolated herself.    Lack of confidence.   No SE with Vraylar.  11/24/21 urgent appointment appt noted: More and more depressed.   So anxious and doesn't want to be alone but can do so. No appetite. Hurts inside. Has had some fleeting suicidal  thoughts but would not act on them.  Tolerating meds. Has been consistent with Vraylar 1.5 mg every other day, fluoxetine 20 mg daily Plan: Increase Vraylar to 1.5 mg daily Change Prozac to Trintellix 10 mg daily. Discussed side effects of each continue Adderall to XR 20 mg AM  12/27/2021 appointment with the following noted: Not OK.  I feel less depressed but feels bat shit. Not sleeping well.  Extremely anxious. Off and on sleep. 3-4 hours of sleep.   Still having daily SI.  But also become obvious has so much to do.  Overwhelmed by tasks.   Needs anxiety meds to just function. Not more motivated.  Walked yesterday.   Feels afraid like in trouble but not irritable or angry. DC DT agitation Vraylar to 1.5 mg daily Change Prozac to Trintellix 10 mg daily. Hold Adderall to XR 20 mg AM Clonidine 0.1 1/2 tablet twice daily for 2 days and if needed for anxiety and sleep increase to 1 twice daily Ok temporary Ativan 1 mg 3 times daily as needed anxiety  01/05/22 appt noted: Off fluoxetine and  Trintellix.  Only on Ativan, trazodone and Adderall XR 20 plus added clonidine 0.1 mg BID Didn't think she needed to start Trintellix. Not taking Ativan.   Didn't like herself last week. Feels some better today. Wonders if the manic sx Not agitated.  Anxiety kind of calmed down.  A lot to be anxious about situationally.  $ stress. Concerns about downers with meds. Can't access normal personality. ? Lethargy and inability to talk as sE. Plan: Latuda 20-40 mg daily with food. Adderall to XR 20 mg AM Clonidine 0.1 1/2 tablet twice daily  reduce dose to be sure no SE Ok temporary Ativan 1 mg 3 times daily as needed anxiety  01/19/22 appt noted: Taking Latuda 20 mg daily.  Took 40 mg once and felt anxious and  SI Still depressed and not very reactive Anxiety mainly about the depression and fears of the future. She wants to revisit manic sx and thinks it was maybe bc taking delta 8 bc was taking a lot  of it so still  doesn't think she's classic bipolar. She wants to only take Prozac bc thinks Latuda is perpetuating depression. Says the delta 8 was very psychaedelic.  When not taking it was not manic.  Sleeping ok again.  Plan: Per her request DC Latuda 20-40 mg daily with food. She wants to continue Prozac alone AMA  Adderall to XR 20 mg AM Clonidine 0.1 1/2 tablet twice daily  reduce dose to be sure no SE Ok temporary Ativan 1 mg 3 times daily as needed anxiety  01/23/2022 phone call complaining of increased anxiety since stopping Latuda.  She will try increasing clonidine.  01/26/2022 phone call not feeling well and wanted to restart the Vraylar.  However notes indicate that had made her agitated therefore she was encouraged to pick up samples of Rexulti 1 mg and start that instead.  02/06/2022 phone call: Stating she felt the Rexulti was helping with depression but she was not sleeping well and obsessing over things.  She was encouraged to increase Rexulti to 2 mg daily and increase trazodone for sleep.  02/09/2022 appointment with the following noted: This was an urgent work in appointment No sleep last night with trazodone 100 mg HS Nothing really better depression or anxiety. Ruminating negative anxious thoughts. Did not tolerate Rexulti because it was causing insomnia.  Does not think it helped depression.  Lacks emotion that she should have.  Lacks her usual personality.  Some hopeless thoughts.  Some death thoughts.  Some suicidal thoughts without plan or intent Plan: DC Rexulti and Prozac & DC trazodone Adderall to XR 20 mg AM Clonidine 0.1 1/tablet twice daily  reduce dose to be sure no SE Ok temporary Ativan 1 mg 3 times daily as needed anxiety Start Seroquel XR 150 mg nightly  03/02/2022 appointment: Angelique Blonder called back a few days after starting Seroquel stating it was making her more anxious and more depressed.  This seemed unlikely as this medicine rarely ever causes anxiety.   She stopped the medication waited 3 days and called back still had anxiety and depression but thought perhaps the anxiety was a little better.  She did not want to take the Seroquel. She knew about the option of Spravato and wanted to pursue that. Now questions whether to return to Seroquel while waiting to start Spravato bc feels just as bad without it and knows she didn't give it enough time to work.   MADRS 46  ECT-MADRS    Flowsheet Row Clinical Support from 07/11/2023 in [facility] Clinical Support from 01/08/2023 in [facility] Clinical Support from 08/06/2022 in [facility] Clinical Support from 07/04/2022 in [facility] Clinical Support from 05/21/2022 in [facility]  MADRS Total Score 9 21 29 15 27       03/14/22 appt noted: Pt received Spravato 56 mg first dose today with some dissociative sx which were not severe.  She was anxious prior to the administration and felt better after receiving lorazepam 1 mg.  No NV, or HA. Wants to continue Spravato. Ongoing depression and desperate to feel better.  I'm not myself DT deprsssion which is most severe in recent history.  Anhedonia.  Low motivation.  Social avoidance. Continues to think all recent med trials are making her worse.  Sleep ok with Seroquel.  03/16/22 appt noted: Received Spravato 84 mg for the first time.  some dissociative sx which were not severe.  She was anxious prior to  the administration and felt better after receiving lorazepam 1 mg.  No NV, or HA. Wants to continue Spravato.   Does not feel any better or different since the last appt.  Ongoing depression.  Ongoing depression and desperate to feel better.  I'm not myself DT deprsssion which is most severe in recent history.  Anhedonia.  Low motivation.  Social avoidance. Continues to think all recent med trials are making her worse.   Sleep ok with Seroquel.  Does not want to continue Seroquel for TRD.  03/20/2022 appointment noted: Came for Spravato administration today.  However blood pressure was significantly elevated approximately 180/115.  She was given lorazepam 1 mg and clonidine 0.2 mg to try to get it down. She states she regretted stopping the Seroquel XR 300 mg tablets.  She now realizes it was helpful.  She did not sleep much at all last night.  She did not take the Adderall this morning. 2 to 3 hours after arrival blood pressure was still elevated at  170/110, 62 pulse.  For Spravato administration was canceled for today.  She admits to being anxious and depressed.  She is not suicidal.  She is highly motivated to receive the Spravato.  We discussed getting it tomorrow.  03/22/2022 appointment noted: Patient's blood pressure was never stable enough yesterday in order to get her in for Spravato administration.  She was encouraged to see her primary care doctor.  It is better today.  03/26/2022 appointment with the following noted: Blood pressure was better.  Saw her primary care doctor who started on oral Bystolic 2.5 mg daily. Received Spravato 84 mg today as scheduled.  Tolerated it well without nausea or vomiting headache or chest pain or palpitations.  Her blood pressure was borderline but manageable. She remains depressed and anxious.  She is ambivalent about the medicine and desperate to get to feel better.  Continues to have anhedonia and low energy and low motivation and reduced ability to do things.  Less social.  Not suicidal.  03/28/22 appt noted: Received Spravato 84 mg today as scheduled.  Tolerated it well without nausea or vomiting headache or chest pain or palpitations.  Her blood pressure was borderline but manageable. Has not seen any improvement so far.  Tolerating Seroquel.  Inconsistent with Bystolic and BP has been borderline high. Still depressed and anxious and anhedonia.  Low motivation, energy,  productivity. Taking quetiapine and tolerating XR 300 mg nightly.  04/04/22 appt noted: Received Spravato 84 mg today as scheduled.  Tolerated it well without nausea or vomiting headache or chest pain or palpitations.  Her blood pressure was borderline but manageable. Has not seen any improvement so far.  Tolerating Seroquel.   She still tends to think that the medications are making her worse.  She has said this about each of the recent psychiatric medicines including Seroquel.  However her husband thinks she is improved.  She also admits there is some improvement in productivity.  She still feels highly anxious.  She still does not enjoy things as normal.  She still feels desperate to improve as soon as possible. Has been taking Seroquel XR since 03/20/2022  04/10/22 appt noted: Received Spravato 84 mg today as scheduled.  Tolerated it well without nausea or vomiting headache or chest pain or palpitations.  Her blood pressure was borderline but manageable. Has not seen any improvement so far.  Tolerating Seroquel.  Doesn't like Seroquel bc she thinks it flattens here. 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  Tolerated it well without nausea or vomiting headache or chest pain or palpitations.  Her blood pressure was borderline but manageable. Has not seen any improvement so far.  Tolerating Seroquel.  Doesn't like Seroquel bc she thinks it flattens her. Received Spravato 84 mg today as scheduled.  Tolerated it well without nausea or vomiting headache or chest pain or palpitations.  Her blood pressure was borderline but manageable. Has not seen any improvement so far.  Tolerating Seroquel.  Doesn't like Seroquel bc she thinks it flattens here.  We discussed her ambivalence about it. She is starting Auvelity and has tolerated it  the last 2 days without side effect.  She still does not feel like herself and feels flat and not enjoying things with suppressed expressed emotion  04/17/2022 appointment with the following noted: Received Spravato 84 mg today as scheduled.  Tolerated it well without nausea or vomiting headache or chest pain or palpitations.  Her blood pressure was borderline but manageable. Has not seen any improvement so far.  Tolerating Seroquel.  Doesn't like Seroquel bc she thinks it flattens her. She has been tolerating the Auvelity 1 in the morning without side effects for about a week.  She has not noticed significant improvement so far.  She still feels depressed and flat and not herself.  Other people notice that she is flat emotionally.  She is not suicidal.  She does feel discouraged that she is not getting better yet.  04/19/2022 appointment noted: Has increased Auvelity to 1 twice daily for 2 days, continues quetiapine XR 300 mg nightly, clonidine 0.3 mg twice daily, lorazepam 1 mg twice daily for anxiety and Adderall XR 20 mg in the morning. No obious SE but she still thinks quetiapine XR is making her feel down.  But not sedated Received Spravato 84 mg today as scheduled.  Tolerated it well without nausea or vomiting headache or chest pain or palpitations.  Her blood pressure was borderline but manageable. She still feels quite anxious and feels it necessary to take both the clonidine and lorazepam twice a day to manage her anxiety.  She has been consistently down and flat and not herself until yesterday afternoon she noted an improvement in mood and feeling much more like herself with her normal personality reemerging.  She was quite depressed in the morning with very dark negative thoughts.  She did not have those dark negative thoughts this morning.  She had a lot of questions about medication and when she was expecting to be improved and why she has not shown improvement up to now.  04/23/22 appt  noted: Has increased Auvelity to 1 twice daily for 1 week, continues quetiapine XR 300 mg nightly, clonidine 0.3 mg twice daily, lorazepam 1 mg twice daily for anxiety and Adderall XR 20 mg in the morning. No obious SE but she still thinks quetiapine XR is making her feel down.  But not sedated Received Spravato 84 mg today as scheduled.  Tolerated it well without nausea or vomiting headache or chest pain or palpitations.  She is still depressed but admits better function and is able to enjoy social interactions. Tolerating meds.  Would like to feel better for sure. Not herself.  Flat. Plan increase Auvelity to 1 tab BID as planned and reduce Quetiapine to 1/2 of ER 300 mg  bc NR for depression.  04/25/2022 appointment with the following noted: clonidine 0.3 mg twice daily, lorazepam 1 mg twice daily for anxiety and Adderall XR 20 mg in the morning. Seroquel XR 300 HS No obious SE but she still thinks quetiapine XR is making her feel down.  But not sedated Received Spravato 84 mg today as scheduled.  Tolerated it well without nausea or vomiting headache or chest pain or palpitations.  Called yesterday with more anxiety.  Had increased Auvelity for 1 day and reduced Seroquel XR for 1 day.  Felt restless and fearful  05/01/2022 appointment noted: clonidine 0.3 mg twice daily, lorazepam 1 mg twice daily for anxiety and Adderall XR 20 mg in the morning. Seroquel XR 150 HS, Auvelity 1 BID Received Spravato 84 mg today as scheduled.  Tolerated it well without nausea or vomiting headache or chest pain or palpitations.  Nurse has noted patient has called multiple times sometimes asking the same question repeatedly.  It is unclear whether she is truly forgetful or is just anxious seeking reassurance. Patient acknowledges ongoing depression as well as some anxiety but states she has felt a little better in the last couple of days.  She has reduced the Seroquel to 150 mg at night and has increased Auvelity to 1  twice daily but only for 1 day.  So far she seems to be tolerating it.  05/03/22 appt noted: clonidine 0.2 mg twice daily, lorazepam 1 mg twice daily for anxiety and Adderall XR 20 mg in the morning. Seroquel XR 150 HS, Auvelity 1 BID BP high this am about 170/100 and received extra clonidine 0.2 mg and came to receive Spravato.  Not dizzy, no SOB, nor CP but BP is still high Could not receive Spravato today bc BP high and pulse low at 30 ppm. Still depressed and anxious. Plan: continue trial Auvelity with Spravato She needs to get BP and pulse managed  05/08/22 TC: RTC  H Michael NA and mailbox full.  Could not leave message.  Pt  -  talked to she and H on speaker. H worried over wife.  Vacant stare.  Slurs words at times.  Not smiling. Reduced enjoyment.  Depression.  Withdrawn from usual activities.  Some irritability.  Anxious. Disc her concerns meds are making her worse.  Extensive discussion about her treatment resistant status.  There is a consistent pattern of not taking the medicines long enough to get benefit because she believes the meds are making her worse.  However the symptoms she describes as side effects are exactly the same symptoms that she had prior to taking the medication RX for  the depression.  So it is not clear that these are actual side effects. This is true about the 2 most recent meds including Seroquel and Auvelity.  Recommend psychiatric consultation in hopes of improving her comfort level with taking prescribed medications for a sufficient length of time to provide benefit. Extensive discussion about ECT is the treatment of choice for treatment resistant depression.  Spravato may work if she can comply with consistency.  There are medication options but they take longer to work.   Plan:  Reduce clonidine to 0.1 mg BID DT bradycardia.  Talk with PCP about BP and low pulse problems which are interfering with her consistent compliance with Spravato.   Limit lorazepam to  3 -4  mg daily max. Excess use is the cause of slurring speech.  She must stop excess use or will have to stop the med. Stop Auvelity per  her request.  But she has only been on the full dose for a little over a week and clearly has not had time to get benefit from it.  She thinks maybe it is making her more anxious. Reduce Seroquel from 150XR to 50 -100 mg at night IR.  She couldn't sleep when stopped it completely. Will not start new antidepressant until her SE issues are resolved or not. Get second psych opinion from Wellington Hampshire MD or another psychiatrist.  H's sister is therapist in [place] Rink, MD, [facility]  05/16/2022 appointment with the following noted: Received Spravato 84 mg today as scheduled.  Tolerated it well without nausea or vomiting headache or chest pain or palpitations.  She stopped Auvelity as discussed last week. On her own, without physician input, she restarted Wellbutrin XL 450 mg every morning today.  She had taken it in the past.  She feels jittery and anxious. She feels less depressed than she did last week.  But she is still depressed without her usual range of affect.  She still is less social and less motivated than normal. Her primary care doctor increased the dose of losartan Plan: Stop Seroquel Reduce Wellbutrin XL to 300 mg every morning.  Starting the dose at 450 every morning is likely causing side effects of jitteriness and it should not be started at that have a dosage. Recommend she not change meds on her own without MDM put  05/23/2022 appointment with the following noted: Received Spravato 84 mg today as scheduled.  Tolerated it well without nausea or vomiting headache or chest pain or palpitations.  Has not dropped seroquel XR 300 mg 1/2 tablet nightly bc couldn't sleep without it. Has not tried lower dose quetiapine 50 mg HS Still feels depressed.   BP is better managed so far, just saw PCP.  BP is better today and infact is low today. Dropped  clonidine as directed from 0.3 mg BID bc inadequate control of BP to 0.2 mg BID.  However she wants to increase it back to 0.3 mg twice daily because she feels it helped her anxiety better.  Wonders about increasing Wellbutrin for depression.  However she has only been on 300 mg a day for a week.  She was on 450 mg daily in the past.  06/06/22 appt noted: Received Spravato 84 mg today as scheduled.  Tolerated it well without nausea or vomiting headache or chest pain or palpitations.  She is still depressed and anxious.  She wants to try to stop the Seroquel but cannot sleep without some of it.  She is taking lorazepam 1 mg 4 times daily and still having a lot of anxiety.  She wants to increase clonidine back to 0.3 mg twice daily.  She hopes for more improvement She recently went for a second psychiatric opinion as suggested the results of that are pending.  06/11/22 appt noted: Received Spravato 84 mg today as scheduled.  Tolerated it well without nausea or vomiting headache or chest pain or palpitations.  She is still depressed and anxious. Without much change.  Still hopeless, anhedonia, reduced inteterest and motivation.  Tolerating meds. Disc concerns Spravato is not hleping much. Plan: stop Seroquel and start olanzapine 10 mg HS for TRD and anxiety.  06/13/2022 appointment noted: Received Spravato 84 mg today as scheduled.  Tolerated it well without nausea or vomiting headache or chest pain or palpitations.  She is still depressed and anxious. Without much change.  Still hopeless, anhedonia, reduced inteterest  and motivation.  Tolerating meds. Disc concerns Spravato is not helping much as hoped but is improving a bit in the last week. Tolerating meds. Continues Wellbutrin XL 450 AM, tolerating recently started olanzapine  10 mg HS. Sleep is good.   Pending appt with TMS consult.  06/18/22 appt noted: Received Spravato 84 mg today as scheduled.  Tolerated it well without nausea or vomiting  headache or chest pain or palpitations.  Tolerating meds. Continues Wellbutrin XL 450 AM, tolerating recently started olanzapine  10 mg HS. Continues Adderall XR 20 amd and has tried to reduce lorazepam to 1mg  TID Sleep is good.   Pending appt with TMS consult. Depression is a little bit better in the last week with a little improvement in emotional expression and interest.  She is pushing herself to be more active.  Her daughter thought she was a little better than she has been.  However she is still depressed and still not her normal self with anhedonia and reduced emotional expressiveness.  06/20/22 appt noted: Received Spravato 84 mg today as scheduled.  Tolerated it well without nausea or vomiting headache or chest pain or palpitations.  Tolerating meds with a little sleepiness. Continues Wellbutrin XL 450 AM, tolerating recently started olanzapine  10 mg HS. Continues Adderall XR 20 amd and has tried to reduce lorazepam to 1mg  TID Sleep is good.   Mood is improving.  Better funciton.  Anxiety is better with olanzapine. Still not herself and depression not gone with some anhedonia and social avoidance and feeling overwhelmed.  8/14 2023 received Spravato 84 mg 06/27/2022 received Spravato Spravato 84 mg 07/02/2022 received Spravato 84 mg 07/04/2022 received Spravato 84 mg  07/09/2022 appointment noted: Received Spravato 84 mg today as scheduled.  Tolerated it well without nausea or vomiting headache or chest pain or palpitations.  Expected dissociation and feels less depressed with resolution of negative emotions immediately after Spravato and then depression, anxiety creep back in. Continues meds Adderall XR 20 mg every morning, Wellbutrin XL 450 every morning, clonidine 0.1 mg twice daily, lorazepam 1 mg every 6 hours as needed, olanzapine increased from 7.5 to 10 mg nightly on  Tolerating meds.  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  She does note some increased appetite and is somewhat concerned about that but has not gained significant amounts of weight. She has had the TMS consultation which was initially denied but she knows it can be appealed.  However because she is improving with Spravato plus the other medications now she wants to continue the current treatment plan.  07/18/22 appt noted: Continues meds Adderall XR 20 mg every morning, Wellbutrin XL 450 every morning, clonidine 0.1 mg twice daily, lorazepam 1 mg every 6 hours as needed, olanzapine increased from 7.5 to 10 mg nightly on 07/04/2022. Received Spravato 84 mg today as scheduled.  Tolerated it well without nausea or vomiting headache or chest pain or palpitations.  Expected dissociation and feels less depressed with resolution of negative emotions immediately after Spravato and then depression, anxiety creep back in. Continues meds Adderall XR 20 mg every morning, Wellbutrin XL 450 every morning, clonidine 0.1 mg twice daily, lorazepam 1 mg every 6 hours as needed, olanzapine increased from 7.5 to 10 mg nightly on  Tolerating meds.  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  She does note some increased appetite and is somewhat concerned about that but has not gained significant amounts of weight. She has had the TMS consultation which was initially denied but she knows it can be appealed. She continues to have chronic ambivalence about psychiatric medicines and initially tends to blame her depressive symptoms such as decreased concentration and feeling flat on what ever medicine she currently is taking even though she had the same symptoms before the current medicines were started.  Then after discussion she does admit that her depressive symptoms are improved since adding  olanzapine but still has those residual symptoms noted.  07/23/22 received Spravato 84 mg   07/30/2022 appointment noted: Received Spravato 84 mg today as scheduled.  Tolerated it well without nausea or vomiting headache or chest pain or palpitations.  Expected dissociation and feels less depressed with resolution of negative emotions immediately after Spravato and then depression, anxiety creep back in. Continues meds Adderall XR 20 mg every morning, Wellbutrin XL 450 every morning, clonidine 0.1 mg twice daily, lorazepam 1 mg every 6 hours as needed, olanzapine increased from 7.5 to 10 mg nightly on  She has been inconsistent with olanzapine because she continues to be ambivalent about the medications in general and thinks that perhaps the 10 mg is making her feel blunted.  She continues to feel some depression.  She had a good day this week and but still feels somewhat depressed and persistently anxious. Plan: be consistent with olanzapine 10 mg HS for TRD and longer trial for potential benefit for anxiety.  Has not taken it consistently.  08/06/2022 appointment noted: Received Spravato 84 mg today as scheduled.  Tolerated it well without nausea or vomiting headache or chest pain or palpitations.  Expected dissociation and feels less depressed with resolution of negative emotions immediately after Spravato and then depression, anxiety creep back in. Continues meds Adderall XR 20 mg every morning, Wellbutrin XL 450 every morning, clonidine 0.1 mg twice daily, lorazepam 1 mg every 6 hours as needed, olanzapine i 10 mg nightly  She continues to feel depressed but is about 50% better with Spravato.  She is still not herself.  She still has anhedonia.  She still is not her able to engage socially in the typical ways.  She is not jovial and outgoing like normal.  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  She continues to feel that her personality is dampened down.  There is a question about whether it is related to depression or medication. Plan: continue olanzapine 10 for longer trial for TRD and severe anxiety.  08/13/22 appt noted:  Received Spravato 84 mg today as scheduled.  Tolerated it well without nausea or vomiting headache or chest pain or palpitations.  Expected dissociation and feels less depressed with resolution of negative emotions immediately after Spravato and then depression, anxiety creep back in. Continues meds Adderall XR 20 mg every morning, Wellbutrin XL 450 every morning, clonidine 0.1 mg twice daily, lorazepam 1 mg every 6 hours as needed, olanzapine i 10 mg nightly  She still does not feel herself.  Still struggles with depression and low motivation and reduced social engagement and reduced interest and reduced emotional expression.  She is somewhat better with the medicines plus Spravato.  She still believes the Spravato makes her blunted and  is not sure how much it helps her anxiety.  She can have good days when her family is around and she is engaged.  She still wants to stop the olanzapine. She has apparently continued to take the trazodone despite having been told to stop it when she started olanzapine.  She feels like she needs the trazodone. Plan: DC olanzapine and Start nortriptyline 25 mg nightly and build up to 75 mg nightly and then check blood level.    08/27/2022 appointment noted: Received Spravato 84 mg today as scheduled.  Tolerated it well without nausea or vomiting headache or chest pain or palpitations.  Expected dissociation and feels less depressed with resolution of negative emotions immediately after Spravato and then depression, anxiety creep back in. Continues meds Adderall XR 20 mg every morning, Wellbutrin XL 450 every morning, clonidine 0.1 mg twice daily, lorazepam 1 mg every 6 hours as needed. Stopped olanzapine and started  nortriptyline which she has taken for about a week is 75 mg nightly. So far she is tolerating the nortriptyline well with the exception of some dry mouth and constipation which she is working to manage.  She does not feel substantially better better or different off the olanzapine.  No change in her sleep which is good.  Main concern currently in addition to the residual depression is anxiety which is somewhat situational with pending arch show.  She is worrying about it more than normal.  Says she is having to take lorazepam twice a day where she had been able to keep reduce it prior to this.  She still does not feel like herself with residual depression with less social interest and less of her usual buoyancy in personality.  She is flatter than normal.  Overall she still feels that the Spravato has been helpful at reducing the severity of the depression.  She is not suicidal. She has not heard anything about the TMS appeal as of yet.  09/05/2022 appointment noted: Received Spravato 84 mg today as scheduled.  Tolerated it well without nausea or vomiting headache or chest pain or palpitations.  Expected dissociation and feels less depressed with resolution of negative emotions immediately after Spravato and then depression, anxiety creep back in. Continues meds Adderall XR 20 mg every morning, Wellbutrin XL 450 every morning, clonidine 0.1 mg twice daily, lorazepam 1 mg every 6 hours as needed. Stopped olanzapine and started nortriptyline which she has taken for about 2 week is 75 mg nightly. Initially blood pressure was a little high causing delay in starting Spravato.  She admitted to feeling a little wound up.  She still experiences a little increase in depression if she goes longer than a week in between doses of Spravato.  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  09/10/22 appt noted: Received  Spravato 84 mg today as scheduled.  Tolerated it well without nausea or vomiting headache or chest pain or palpitations.  Expected dissociation gradually resolved over the 2 hour observation period. She feels 50% less depressed with Spravto and wants to continue it.   Continues meds Adderall XR 20 mg every morning, Wellbutrin XL 450 every morning, clonidine 0.1 mg twice daily, lorazepam 1 mg every 6 hours as needed. Has started nortriptyline 75 mg nightly for about 3 weeks. Has not seen a significant difference with the addition of nortriptyline.  Tolerating it pretty well. She continues  to have some degree of anhedonia and significant depression and anxiety.  Her daughters noticed that she is more needy and calls more frequently.  She acknowledges this as well.  She is clearly still not herself. Plan: pramipexole off label and RX 0.25 mg BID  09/17/2022 appointment noted: Received Spravato 84 mg today as scheduled.  Tolerated it well without nausea or vomiting headache or chest pain or palpitations.  Expected dissociation gradually resolved over the 2 hour observation period. She feels 50% less depressed with Spravto and wants to continue it.   Continues meds Adderall XR 20 mg every morning, Wellbutrin XL 450 every morning, clonidine 0.1 mg twice daily, lorazepam 1 mg every 6 hours as needed. Has started nortriptyline 75 mg nightly for about 3 weeks and DT level 176, reduced to 50 mg HS early November. Still the same sx as noted last visit.  Tolerating meds.   Compliant.  Still depressed and family notices.  Has been able to participate in family interactions.  Some post-show let down and has to do detailed work which is hard for her bc ADD.  Sleep and eating well.  Energy OK but not great.  No SI.  Not cried in a year or so.  Clearly less depressed and hopeless than before the Spravato.  09/24/22 appt noted: Received Spravato 84 mg today as scheduled.  Tolerated it well without nausea or vomiting  headache or chest pain or palpitations.  Expected dissociation gradually resolved over the 2 hour observation period. She feels 50% less depressed with Spravto and wants to continue it.   Continues meds Adderall XR 20 mg every morning, Wellbutrin XL 450 every morning, clonidine 0.1 mg twice daily, lorazepam 1 mg every 6 hours as needed. Has started nortriptyline 75 mg nightly for about 3 weeks and DT level 176, reduced to 50 mg HS early November. Still the same sx as noted last visit.  Tolerating meds.   Compliant.  Still depressed and family notices.  Has been able to participate in family interactions.  Some post-show let down and has to do detailed work which is hard for her bc ADD.  Sleep and eating well.  Energy OK but not great.  No SI.  Not cried in a year or so.  Clearly less depressed and hopeless than before the Spravato. Is not making further progress generally.  Stuck with moderate depression  10/02/22 appt noted: Received Spravato 84 mg today as scheduled.  Tolerated it well without nausea or vomiting headache or chest pain or palpitations.  Expected dissociation gradually resolved over the 2 hour observation period. She feels 50% less depressed with Spravto and wants to continue it.   Continues meds Adderall XR 20 mg every morning, Wellbutrin XL 450 every morning, clonidine 0.1 mg twice daily, lorazepam 1 mg every 6 hours as needed. Has started nortriptyline 75 mg nightly for about 3 weeks and DT level 176, reduced to 50 mg HS early November. Still the same sx as noted last visit.  Tolerating meds.   Compliant.  Still depressed and family notices.  Has been able to participate in family interactions.  Some post-show let down and has to do detailed work which is hard for her bc ADD.  Sleep and eating well.  Energy OK but not great.  No SI.  Not cried in a year or so.  Clearly less depressed and hopeless than before the Spravato. Is not making further progress generally.  Stuck with moderate  depression.  Is  able to function pretty normally. Plan: trial pramipexole 0.25 mg BID off label for depression.   10/08/22 appt noted: Received Spravato 84 mg today as scheduled.  Tolerated it well without nausea or vomiting headache or chest pain or palpitations.  Expected dissociation gradually resolved over the 2 hour observation period. She feels 50% less depressed with Spravto and wants to continue it.   Continues meds Adderall XR 20 mg every morning, Wellbutrin XL 450 every morning, clonidine 0.1 mg twice daily, lorazepam 1 mg every 6 hours as needed. Has started nortriptyline 75 mg nightly for about 3 weeks and DT level 176, reduced to 50 mg HS early November. Still the same sx as noted last visit.  Tolerating meds.   Compliant.  Still depressed and family notices.  Has been able to participate in family interactions.  Some post-show let down and has to do detailed work which is hard for her bc ADD.  Sleep and eating well.  Energy OK but not great.  No SI.  Not cried in a year or so.  Clearly less depressed and hopeless than before the Spravato. Is not making further progress generally.  Stuck with moderate depression.  Behaved and felt pretty normally with family over for Thanksgiving. Doesn't see benefit or SE with pramipexole but thinks maybe it makes her worse. Plan:  increase pramipexole augmentation off label to 0.5 mg BID  10/15/2022 appointment noted: Received Spravato 84 mg today as scheduled.  Tolerated it well without nausea or vomiting headache or chest pain or palpitations.  Expected dissociation gradually resolved over the 2 hour observation period. She feels 50% less depressed with Spravto and wants to continue it.   Continues meds Adderall XR 20 mg every morning, Wellbutrin XL 450 every morning, clonidine 0.2 mg twice daily, lorazepam 1 mg every 6 hours as needed.  Trazodone 50 mg tablets 1-2 nightly as needed insomnia Has started nortriptyline 75 mg nightly for about 3 weeks  and DT level 176, reduced to 50 mg HS early November. Recommended increase pramipexole 0.5 mg twice daily off label for treatment resistant depression on 10/08/2022. She feels better motivated more active with pramipexole 0.5 mg twice daily.  She still is depressed but it is better.  We discussed the possibility of going up in the dose but did not change it.  10/22/2022 appointment noted: Received Spravato 84 mg today as scheduled.  Tolerated it well without nausea or vomiting headache or chest pain or palpitations.  Expected dissociation gradually resolved over the 2 hour observation period. She feels 50% less depressed with Spravto and wants to continue it.   Continues meds Adderall XR 20 mg every morning, Wellbutrin XL 450 every morning, clonidine 0.2 mg twice daily, lorazepam 1 mg every 8 hours as needed.  Trazodone 50 mg tablets 1-2 nightly as needed insomnia Has started nortriptyline 75 mg nightly for about 3 weeks and DT level 176, reduced to 50 mg HS early November. pramipexole 0.5 mg twice daily off label for treatment resistant depression on 10/08/2022. She is better motivated than she was.  She is journaling 3 pages a day.  She has started walking and has walked 5 days a week for 50 minutes for the last 2 weeks.  That is significantly helped her mood.  Her mood tends to be better when she interacts with family.  However she still has some periods of depression.  She is tolerating the medications well.  She still feels like her affect and confidence is not  back to normal. Plan:  increase pramipexole augmentation off label to 0.5 mg tID or 0.75 mg BID   10/29/22 appt noted: Received Spravato 84 mg today as scheduled.  Tolerated it well without nausea or vomiting headache or chest pain or palpitations.  Expected dissociation gradually resolved over the 2 hour observation period. She feels 50% less depressed with Spravto and wants to continue it.   Continues meds Adderall XR 20 mg every  morning, Wellbutrin XL 450 every morning, clonidine 0.2 mg twice daily, lorazepam 1 mg every 8 hours as needed.  Trazodone 50 mg tablets 1-2 nightly as needed insomnia Has started nortriptyline 75 mg nightly for about 3 weeks and DT level 176, reduced to 50 mg HS early November. pramipexole increased to 0.75 mg twice daily off label for treatment resistant depression  She has been feeling somewhat better with the increase in pramipexole and is tolerating it well.  She still is easily overwhelmed.  Her affect and mood can improve now when around her family or doing something positive.  She has been able to be more productive. She is tolerating the current medications. She is still considering TMS as an alternative to Spravato.  11/15/2022 appointment noted: Received Spravato 84 mg today as scheduled.  Tolerated it well without nausea or vomiting headache or chest pain or palpitations.  Expected dissociation gradually resolved over the 2 hour observation period. She feels 50% less depressed with Spravto and wants to continue it.   Continues meds Adderall XR 20 mg every morning, Wellbutrin XL 450 every morning, clonidine 0.2 mg twice daily, lorazepam 1 mg every 8 hours as needed.  Trazodone 50 mg tablets 1-2 nightly as needed insomnia Has started nortriptyline 75 mg nightly for about 3 weeks and DT level 176, reduced to 50 mg HS early November. pramipexole increased to 0.75 mg twice daily off label for treatment resistant depression  He has noticed some increase in depression due to the length of time since the last Spravato administration.  She believes Spravato is helping her.  sHe is not suicidal but has felt very blue the last few days. She is tolerating the medication. She is ambivalent about Spravato versus TMS but she is considering TMS. She wants to continue Spravato administration because it is clearly helpful.  11/19/22 appt noted: Received Spravato 84 mg today as scheduled.  Tolerated it well  without nausea or vomiting headache or chest pain or palpitations.  Expected dissociation gradually resolved over the 2 hour observation period. She feels 50% less depressed with Spravto and wants to continue it.   Continues meds Adderall XR 20 mg every morning, Wellbutrin XL 450 every morning, clonidine 0.2 mg twice daily, lorazepam 1 mg every 8 hours as needed.  Trazodone 50 mg tablets 1-2 nightly as needed insomnia Has started nortriptyline 75 mg nightly for about 3 weeks and DT level 176, reduced to 50 mg HS early November. pramipexole increased to 0.75 mg twice daily off label for treatment resistant depression  She has felt better back on Spravato more regularly.  However she still has residual depression esp when alone or when wihtout activity.  Can function when needed.   Does not have her connfidence back. She plans to pursue TMS availability. Have discussed retrying Auvelity  11/28/22 appt noted: Received Spravato 84 mg today as scheduled.  Tolerated it well without nausea or vomiting headache or chest pain or palpitations.  Expected dissociation gradually resolved over the 2 hour observation period. She feels 50% less  depressed with Spravto and wants to continue it.   Continues meds Adderall XR 20 mg every morning, Wellbutrin XL 450 every morning, clonidine 0.2 mg twice daily, lorazepam 1 mg every 8 hours as needed.  Trazodone 50 mg tablets 1-2 nightly as needed insomnia Has started nortriptyline 75 mg nightly for about 3 weeks and DT level 176, reduced to 50 mg HS early November. pramipexole increased to 0.75 mg twice daily off label for treatment resistant depression  Last week she was more depressed than usual for reasons that are not clear.  She has not had a clear answer from Filomena Jungling about [employer] options.  States that they have not returned her call.  She is asked about Auvelity retry in place of Wellbutrin.  Has still been walking.  12/03/22 appt noted: Received Spravato 84 mg today  as scheduled.  Tolerated it well without nausea or vomiting headache or chest pain or palpitations.  Expected dissociation gradually resolved over the 2 hour observation period. She feels 50% less depressed with Spravto and wants to continue it.   Continues meds Adderall XR 20 mg every morning, Wellbutrin XL 450 every morning, clonidine 0.2 mg twice daily, lorazepam 1 mg every 8 hours as needed.  Trazodone 50 mg tablets 1-2 nightly as needed insomnia started nortriptyline 75 mg nightly for about 3 weeks and DT level 176, reduced to 50 mg HS early November. pramipexole increased to 0.75 mg twice daily off label for treatment resistant depression  No SE .  Satisfied with meds. Depression is better in the last week.  Not sure why that is the case.  Still walking daily and that helps and journaling 3 pages daily.  Working on Biomedical scientist.  No changes desire.  Clearly benefits from Spravato but not 100%.  Still considering TMS.  12/10/22 appt noted: Received Spravato 84 mg today as scheduled.  Tolerated it well without nausea or vomiting headache or chest pain or palpitations.  Expected dissociation gradually resolved over the 2 hour observation period. She feels 50% less depressed with Spravto and wants to continue it.   Continues meds Adderall XR 20 mg every morning, Wellbutrin XL 450 every morning, clonidine 0.2 mg twice daily, lorazepam 1 mg every 8 hours as needed.  Trazodone 50 mg tablets 1-2 nightly as needed insomnia started nortriptyline 75 mg nightly for about 3 weeks and DT level 176, reduced to 50 mg HS early November. pramipexole increased to 0.75 mg twice daily off label for treatment resistant depression  No SE .   Depression was worse this week for no apparent reason.  She struggled being positive.  She has felt more discouraged.  She has felt more anxious.  She has had a hard time doing tasks.  She is interested in retrying the [place] which we had discussed previously. Plan: She agrees to  [employer].  To improve tolerability and reduce risk of side effects, Stop Wellbutrin and start Auvelity 1 in the morning for 1 week then 1 twice daily AndReduce pramipexole 0.5 mg BID and reduce nortriptyline to 50 mg HS  12/17/22 appt noted: Received Spravato 84 mg today as scheduled.  Tolerated it well without nausea or vomiting headache or chest pain or palpitations.  Expected dissociation gradually resolved over the 2 hour observation period. She feels 50% less depressed with Spravto and wants to continue it.   Continues meds Adderall XR 20 mg every morning, stopped Wellbutrin XL 150 every morning, clonidine 0.2 mg twice daily, lorazepam 1 mg every  8 hours as needed.  Trazodone 50 mg tablets 1-2 nightly as needed insomnia Has started nortriptyline 75 mg nightly for about 3 weeks and DT level 176, reduced to 50 mg HS early November. pramipexole 0.375 mg twice daily off label for treatment resistant depression with plan to stop Started Auvelity 1 AM Still depressed to moderate degree.  Tolerating Auvelity so far.  No other problems with meds.  Willing to give Auvelity a chance. Plan: She agrees to [employer].  Continue 1 AM and when tolerated then increase to BID Stop pramipexole.  12/26/22 received Spravato  01/01/23 appt noted: Received Spravato 84 mg today as scheduled.  Tolerated it well without nausea or vomiting headache or chest pain or palpitations.  Expected dissociation gradually resolved over the 2 hour observation period. She feels 50% less depressed with Spravto and wants to continue it.   Continues meds Adderall XR 20 mg every morning, stopped Wellbutrin XL 150 every morning, clonidine 0.2 mg twice daily, lorazepam 1 mg every 8 hours as needed.  Trazodone 50 mg tablets 1-2 nightly as needed insomnia Has started nortriptyline 75 mg nightly for about 3 weeks and DT level 176, reduced to 50 mg HS early November. Forgot to reduce pramipexole stoll taking  0.5 mg twice daily off  label for treatment resistant depression  Started Auvelity 1 AM & PM last week. Occ misses Spravato DT htn.this week a better than last.   Painting again more and it helps mood.  Confidence still low and not as likely to socialize as normal but enjoys family.   Still ambivalent about TMS. Tolerating meds.  Including the increase in [place].    01/08/23 appt noted: Received Spravato 84 mg today as scheduled.  Tolerated it well without nausea or vomiting headache or chest pain or palpitations.  Expected dissociation gradually resolved over the 2 hour observation period. She feels 50% less depressed with Spravto and wants to continue it.   Continues meds Adderall XR 20 mg every morning, stopped Wellbutrin XL 150 every morning, clonidine 0.2 mg twice daily, lorazepam 1 mg every 8 hours as needed.  Trazodone 50 mg tablets 1-2 nightly as needed insomnia Has started nortriptyline 75 mg nightly for about 3 weeks and DT level 176, reduced to 50 mg HS early November. reduced pramipexole to 0.5 mg daily off label for treatment resistant depression  Started Auvelity 1 AM & PM mid Feb. More depressed as week progresses.  Thinks it is worse with less pramipexole.  More negative.  Able to function but feels miserable.  No SI.  Hopeless.  Sleep ok.  Tolerating meds.   Talked to [facility] about TMS and appt mid March. Plan: increase pramipexole to 0.5 mg TID for dep bc seemed to worsen with the reduction.  01/15/23 appt noted: Received Spravato 84 mg today as scheduled.  Tolerated it well without nausea or vomiting headache or chest pain or palpitations.  Expected dissociation gradually resolved over the 2 hour observation period. She feels 50% less depressed with Spravto and wants to continue it.   Continues meds Adderall XR 20 mg every morning, stopped Wellbutrin XL 150 every morning, clonidine 0.2 mg twice daily, lorazepam 1 mg every 8 hours as needed.  Trazodone 50 mg tablets 1-2 nightly as needed  insomnia Has started nortriptyline 75 mg nightly for about 3 weeks and DT level 176, reduced to 50 mg HS early November. Increased pramipexole to 0.5 mg  to TID  off label for treatment resistant depression  Started Auvelity 1 AM & PM mid Feb. markedly better depression the increase in pramipexole.  She feels like her depression is almost resolved.  She is very pleased with the response.  She is tolerating the medications well  01/30/23 appt noted: Received Spravato 84 mg today as scheduled.  Tolerated it well without nausea or vomiting headache or chest pain or palpitations.  Expected dissociation gradually resolved over the 2 hour observation period. She feels 50% less depressed with Spravto and wants to continue it.   Continues meds Adderall XR 20 mg every morning, stopped Wellbutrin XL 150 every morning, clonidine 0.2 mg twice daily, lorazepam 1 mg every 8 hours as needed.  Trazodone 50 mg tablets 1-2 nightly as needed insomnia Has started nortriptyline 75 mg nightly for about 3 weeks and DT level 176, reduced to 50 mg HS early November. Increased pramipexole to 0.5 mg  to TID  off label for treatment resistant depression  Started Auvelity 1 AM & PM mid Feb. Mood markedly better with pramipexole added.  Nearly normal mood now with minimal depression.  More social and outgoing and motivated and resolved anhedonia. No SE.  Compliant.  02/04/23 appt noted: Continues meds Adderall XR 20 mg every morning, stopped Wellbutrin XL 150 every morning, clonidine 0.2 mg twice daily, lorazepam 1 mg every 8 hours as needed.  Trazodone 50 mg tablets 1-2 nightly as needed insomnia Has started nortriptyline 75 mg nightly for about 3 weeks and DT level 176, reduced to 50 mg HS early November. Increased pramipexole to 0.5 mg  to TID  off label for treatment resistant depression  Started Auvelity 1 AM & PM mid Feb. Received Spravato 84 mg today as scheduled.  Tolerated it well without nausea or vomiting headache  or chest pain or palpitations.  Expected dissociation gradually resolved over the 2 hour observation period. She feels 50% less depressed with Spravto and wants to continue it.  The pramipexole got her the rest of the way better.  Doesn't want to cut back on any tx bc afraid of relapse. Tolerating meds.  02/15/23 appt noted: Continues meds Adderall XR 20 mg every morning, stopped Wellbutrin XL 150 every morning, clonidine 0.2 mg twice daily, lorazepam 1 mg every 8 hours as needed.  Trazodone 50 mg tablets 1-2 nightly as needed insomnia Has started nortriptyline 75 mg nightly for about 3 weeks and DT level 176, reduced to 50 mg HS early November. Increased pramipexole to 0.5 mg  to TID  off label for treatment resistant depression  Started Auvelity 1 AM & PM mid Feb. Received Spravato 84 mg today as scheduled.  Tolerated it well without nausea or vomiting headache or chest pain or palpitations.  Expected dissociation gradually resolved over the 2 hour observation period. She feels no longer depressed.   The pramipexole got her the rest of the way better.  Doesn't want to cut back on any tx bc afraid of relapse. Tolerating meds.  02/18/23 appt noted: Continues meds Adderall XR 20 mg every morning, stopped Wellbutrin XL 150 every morning, clonidine 0.2 mg twice daily, lorazepam 1 mg every 8 hours as needed.  Trazodone 50 mg tablets 1-2 nightly as needed insomnia Has started nortriptyline 75 mg nightly for about 3 weeks and DT level 176, reduced to 50 mg HS early November. Increased pramipexole to 0.5 mg  to TID  off label for treatment resistant depression  Started Auvelity 1 AM & PM mid Feb. Received Spravato 84 mg today as scheduled.  Tolerated it well without nausea or vomiting headache or chest pain or palpitations.  Expected dissociation gradually resolved over the 2 hour observation period. She feels no longer depressed except when ran out of Auvelity this week DT lack of availability.  Much worse  without it..   The pramipexole got her the rest of the way better.  Doesn't want to cut back on any tx bc afraid of relapse. Tolerating meds.  02/25/23 appt: Received Spravato 84 mg today as scheduled.  Tolerated it well without nausea or vomiting headache or chest pain or palpitations.  Expected dissociation gradually resolved over the 2 hour observation period. Continues meds Adderall XR 20 mg every morning, stopped Wellbutrin XL 150 every morning, clonidine 0.2 mg twice daily, lorazepam 1 mg every 8 hours as needed.  Trazodone 50 mg tablets 1-2 nightly as needed insomnia Has started nortriptyline 75 mg nightly for about 3 weeks and DT level 176, reduced to 50 mg HS early November. Increased pramipexole to 0.5 mg  to TID  off label for treatment resistant depression  Started Auvelity 1 AM & PM mid Feb. No SE Still dramatically better with meds and Spravato.  Not 100% over depression. Had GS born last week and able to enjoy it now.  Is a little sleepy with pramipexole but manageable.  No change desired.  Sleep is ok.  03/04/23 appt noted: Received Spravato 84 mg today as scheduled.  Tolerated it well without nausea or vomiting headache or chest pain or palpitations.  Expected dissociation gradually resolved over the 2 hour observation period. Continues meds Adderall XR 20 mg every morning, stopped Wellbutrin XL 150 every morning, clonidine 0.2 mg twice daily, lorazepam 1 mg every 8 hours as needed.  Trazodone 50 mg tablets 1-2 nightly as needed insomnia nortriptyline reduced to 50 mg HS early November. Increased pramipexole to 0.5 mg  to TID  off label for treatment resistant depression  Started Auvelity 1 AM & PM mid Feb. Still generally doing well with meds and Spravato except when got tooth abscess.  Freels more depressed after dental surgery.   No SE with meds.   Sleep is OK.  No med changes desired  03/11/23 appt noted: Received Spravato 84 mg today as scheduled.  Tolerated it well  without nausea or vomiting headache or chest pain or palpitations.  Expected dissociation gradually resolved over the 2 hour observation period. Continues meds Adderall XR 20 mg every morning, stopped Wellbutrin XL 150 every morning, clonidine 0.2 mg twice daily, lorazepam 1 mg every 8 hours as needed.  Trazodone 50 mg tablets 1-2 nightly as needed insomnia nortriptyline reduced to 50 mg HS early November. Increased pramipexole to 0.5 mg  to TID  off label for treatment resistant depression  Started Auvelity 1 AM & PM mid Feb. Some increase depression with infection and needing amoxiciliin this week.  Otherwie still doing well with mood and meds. No med changes desired or indicated. No SE  03/21/2023 appointment noted: Continues meds Adderall XR 20 mg every morning, stopped Wellbutrin XL 150 every morning, clonidine 0.2 mg twice daily, lorazepam 1 mg every 8 hours as needed.  Trazodone 50 mg tablets 1-2 nightly as needed insomnia nortriptyline reduced to 50 mg HS early November. Increased pramipexole to 0.5 mg  to TID  off label for treatment resistant depression  Started Auvelity 1 AM & PM mid Feb. Received Spravato 84 mg today as scheduled.  Tolerated it well without nausea or vomiting headache or chest pain or  palpitations.  Expected dissociation gradually resolved over the 2 hour observation period. Depression is much better.  Pretty much resolved this week.  She feels the Auvelity and pramipexole have made the biggest difference.  Obviously the Spravato is also helping significantly.  She wants to continue all of these medications.  She still has anxiety easily.  Especially facing something that she would rather avoid.  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 Tolerating meds without significant side effects. Plan no med changes  03/29/23 appt noted: Continues meds Adderall XR 20 mg every morning, stopped  Wellbutrin XL 150 every morning, clonidine 0.2 mg twice daily, lorazepam 1 mg every 8 hours as needed.  Trazodone 50 mg tablets 1-2 nightly as needed insomnia, nortriptyline 50 mg HS,  pramipexole 0.5 mg  to TID  off label for treatment resistant depression  Started Auvelity 1 AM & PM mid Feb 2024 No SE Received Spravato 84 mg today as scheduled.  Tolerated it well without nausea or vomiting headache or chest pain or palpitations.  Expected dissociation gradually resolved over the 2 hour observation period. Depression continues to improve to a point of very mild sx.  Still hasn't gotten her confidence fully back.  She does enjoy social interactions and is productive at work. She is tolerating the meds well without side effects.  We discussed the possibility of reducing some of the medication given the marked response which was positive with pramipexole.  04/04/23 appt noted: Continues meds Adderall XR 20 mg every morning, stopped Wellbutrin XL 150 every morning, clonidine 0.2 mg twice daily, lorazepam 1 mg every 8 hours as needed.  Trazodone 50 mg tablets 1-2 nightly as needed insomnia, nortriptyline 50 mg HS,  pramipexole 0.5 mg  to TID  off label for treatment resistant depression  Started Auvelity 1 AM & PM mid Feb 2024 No SE Received Spravato 84 mg today as scheduled.  Tolerated it well without nausea or vomiting headache or chest pain or palpitations.  Expected dissociation gradually resolved over the 2 hour observation period. Dep is still much improved with increase in pramipexole.  Largely resolved.  Still gets anxious but takes less lorazepam prn but still needs it.  Sleep ok.  Tolerating meds.  04/10/23 appt noted: Continues meds Adderall XR 20 mg every morning, clonidine 0.2 mg twice daily, lorazepam 1 mg every 8 hours as needed.  Trazodone 50 mg tablets 1-2 nightly as needed insomnia, nortriptyline 50 mg HS,  pramipexole 0.5 mg  to TID  off label for treatment resistant depression  Started  Auvelity 1 AM & PM mid Feb 2024 No SE.  Satisfied with meds.   Received Spravato 84 mg today as scheduled.  Tolerated it well without nausea or vomiting headache or chest pain or palpitations.  Expected dissociation gradually resolved over the 2 hour observation period. Dep is still much improved with increase in pramipexole.  Largely resolved.  Still gets anxious but takes less lorazepam prn but still needs it.  Sleep ok.  Tolerating meds. Did an art show in another town; I couldn't have done this a year ago.    05/07/23 appt noted: Continues meds Adderall XR 20 mg every morning, clonidine 0.2 mg twice daily, lorazepam 1 mg every 8 hours as needed.  Trazodone 50 mg tablets 1-2 nightly as needed insomnia, nortriptyline 25-50 mg HS,  pramipexole 0.5 mg  to TID  off label for  treatment resistant depression  Started Auvelity 1 AM & PM mid Feb 2024 No SE.  Satisfied with meds.   Received Spravato 84 mg today as scheduled.  Tolerated it well without nausea or vomiting headache or chest pain or palpitations.  Expected dissociation gradually resolved over the 2 hour observation period. H says she is her old self.  Dep so much better.  Painting going well.  Anxiety in AM and then better.  Wakes with it.  Structured days more.  Negative self talk gone.  Wonderful to have relief.  Good weekend at a wedding.  Enjoys GS; [place].  More outspoken since the dep but not over the top.   More interest overall and satisfied wth resp.  Agrees to wean nortriptyline but doesn't want other med changes.  05/15/23 received Spravato 84  05/27/23 appt noted: Continues meds Adderall XR 20 mg every morning, clonidine 0.2 mg twice daily, lorazepam 1 mg every 8 hours as needed.  Trazodone 50 mg tablets 1-2 nightly as needed insomnia, pramipexole 0.5 mg  to TID  off label for treatment resistant depression  Started Auvelity 1 AM & PM mid Feb 2024 No SE.  Satisfied with meds.   Received Spravato 84 mg today as scheduled.  Tolerated  it well without nausea or vomiting headache or chest pain or palpitations.  Expected dissociation gradually resolved over the 2 hour observation period. Maybe a little more anxious re: weaning off nortriptyline.  Busy summer.  Extremely prolific painting.   Has a pending show and stressed over preparing.   Enjoys [employer].  Kept him yesterday for a couple of hours.   Dep has been like a cloak with physical sensation for a long time but is much better than it was.  Still not 100% all the time.  Struggles with some chronic guilt issues.  Self talk and CBT on herself yesterday had some. Still some fear about travelling and demands but so much better.   Making people laugh again and socially engaged and H agrees so much better. M has been impossible.  WC and immobile.  She wastes money.   Plan no med changes  06/03/23 appt noted: Meds as above.  No SE.  Satisfied with meds.   Received Spravato 84 mg today as scheduled.  Tolerated it well without nausea or vomiting headache or chest pain or palpitations.  Expected dissociation gradually resolved over the 2 hour observation period. Overall she feels better with Spravato.  She is minimally depressed generally when she is alone and not active but she is staying active socially and with her painting.  This is really helped with her depression and she is having more success.  She still has residual anxiety which she feels she needs to take lorazepam during the day.  She is attempting to minimize it but without much success.  She wants to continue the Spravato because of the dramatic improvement she is seen with this and the medications.  06/11/23 appt noted: Meds as above.  No SE.  Satisfied with meds.   Received Spravato 84 mg today as scheduled.  Tolerated it well without nausea or vomiting headache or chest pain or palpitations.  Expected dissociation gradually resolved over the 2 hour observation period. Still has some anxiety wihtout reason an benefit  lorazepam and usually only 1 mg daily.  Makes her sleepy if takes it in the day. Sees sig benefit with pramipexole 0.5 mg TID and consistent with it. Mood is still good overall.  But bc mother still can worry about getting older.  Feels too scared about it but working on it. No concerns with meds.    06/20/23 appt noted:  Continues meds Adderall XR 20 mg every morning, clonidine 0.2 mg twice daily, lorazepam 1 mg every 8 hours as needed.  Trazodone 50 mg tablets 1-2 nightly as needed insomnia, pramipexole 0.5 mg  to TID  off label for treatment resistant depression  Started Auvelity 1 AM & PM mid Feb 2024 No SE.  Satisfied with meds.  Needs each as written. Received Spravato 84 mg today as scheduled.  Tolerated it well without nausea or vomiting headache or chest pain or palpitations.  Expected dissociation gradually resolved over the 2 hour observation period. Marked benefit Spravato and meds remain.  No manic sx.  Residual anxiety is gradually improving.  Sleep is ok. Some residual anxiey at time.  Enjoys GS and family and functioning fine. Plan no med changes  06/25/23 appt noted: Continues meds Adderall XR 20 mg every morning, clonidine 0.2 mg twice daily, lorazepam 1 mg every 8 hours as needed.  Trazodone 50 mg tablets 1-2 nightly as needed insomnia, pramipexole 0.5 mg  to TID  off label for treatment resistant depression  Started Auvelity 1 AM & PM mid Feb 2024 No SE.  Satisfied with meds.  Needs each as written. Received Spravato 84 mg today as scheduled.  Tolerated it well without nausea or vomiting headache or chest pain or palpitations.  Expected dissociation gradually resolved over the 2 hour observation period. Marked benefit Spravato and meds remain.  Minimal depression at this time. No manic sx.  Residual anxiety is gradually improving and she is using much less lorazepam.  Sleep is ok. Some residual anxiey at time.  Enjoys GS and family and functioning fine. Plan no med  changes  07/04/23 appt noted: Continues meds Adderall XR 20 mg every morning, clonidine 0.2 mg twice daily, lorazepam 1 mg every 8 hours as needed.  Trazodone 50 mg tablets 1-2 nightly as needed insomnia, pramipexole 0.5 mg  to TID  off label for treatment resistant depression  Started Auvelity 1 AM & PM mid Feb 2024 No SE.  Satisfied with meds.  Needs each as written. Received Spravato 84 mg today as scheduled.  Tolerated it well without nausea or vomiting headache or chest pain or palpitations.  Expected dissociation gradually resolved over the 2 hour observation period.  Good experience today. Dealing with stress Of having to place her into a nursing facility.  However she has handled it well.  Patient has been able to take over responsibilities in the family that she could not have done prior to [facility] and current meds.  Her depression is in remission.  She she is not highly anxious usually but still needs lorazepam in the evening.  No problems or concerns with meds.  ADD is well managed with the Adderall.  She is productive and enjoying life including working family.  07/11/23 appt noted: Continues meds Adderall XR 20 mg every morning, clonidine 0.2 mg twice daily, lorazepam 1 mg every 8 hours as needed.  Trazodone 50 mg tablets 1-2 nightly as needed insomnia, pramipexole 0.5 mg  to TID  off label for treatment resistant depression  Started Auvelity 1 AM & PM mid Feb 2024 No SE.  Satisfied with meds.  Needs each as written. Received Spravato 84 mg today as scheduled.  Tolerated it well without nausea or vomiting headache or chest pain or palpitations.  Expected dissociation gradually resolved over the 2 hour observation period.  Good experience today. Depression remained under control with remission.  She is able to enjoy things normally that she could not enjoy last year.  She is able to handle stressors that she could not handle last year such as dealing with her mother's declining health and  the family issues that that brings up.  That has created more anxiety and she needs the lorazepam lately.  She had been avoiding it for the most part until these issues.  No changes desired.  09/02/23 appt noted:  no Spravato Misses Auvelity if doesn't take it but $450/month.  Will look into cost solutions.  On MCR now. Kathlene November Retired.  Helping with grandchild. Done art shows out of town.   Couldn't have done it a year ago. Chronic disorganization.   H retired.  Big lifestyle change.  H hanging out with .    ECT-MADRS    Flowsheet Row Clinical Support from 07/11/2023 in [facility] Clinical Support from 01/08/2023 in [facility] Clinical Support from 08/06/2022 in [facility] Clinical Support from 07/04/2022 in [facility] Clinical Support from 05/21/2022 in [facility]  MADRS Total Score 9 21 29 15 27       Past Psychiatric Medication Trials: fluoxetine, duloxetine, Viibryd, Pristiq, sertraline, citalopram,  Trintellix anxious and SI Wellbutrin XL 450 Auvelity 1 dose nortriptyline 75 mg nightly for about 3 weeks and DT level 176, reduced to 50 mg HS NR. Pramipexole 1 mg NR Adderall, Adderall XR, Vyvanse, Ritalin, Strattera low dose NR Lorazepam Trazodone  Depakote,  lamotrigine cog complaints Lithium remotely Abilify 7.5  Vraylar 1.5 mg daily agitation and insomnia Rexulti insomnia Latuda 40 one dose, CO anxious and SI Seroquel XR 300 Olanzapine 10  At visit November 12, 2019. We discussed Patient developed an increasingly severe alcohol dependence problem since her last visit in January.  She went to [facility] and has had no alcohol abuse since .  She never abused stimulants but they took her off the stimulants at [facility].  Her ADD was markedly worse.  The Wellbutrin did not help the ADD.   D history lamotrigine rash at 67  yo  Review of Systems:  Review of Systems  Musculoskeletal:  Positive for back pain. Negative for arthralgias.       SP hip surgery October 2020  Neurological:  Negative for tremors.  Psychiatric/Behavioral:  Negative for agitation, behavioral problems, confusion, decreased concentration, dysphoric mood, hallucinations, self-injury, sleep disturbance and suicidal ideas. The patient is nervous/anxious. The patient is not hyperactive.        Forgetful at times about med recommendations but better now    Medications: I have reviewed the patient's current medications.  Current Outpatient Medications  Medication Sig Dispense Refill   amLODipine (NORVASC) 2.5 MG tablet Take 2.5 mg by mouth daily.     [START ON 09/30/2023] amphetamine-dextroamphetamine (ADDERALL XR) 20 MG 24 hr capsule Take 1 capsule (20 mg total) by mouth every morning. 30 capsule 0   amphetamine-dextroamphetamine (ADDERALL XR) 20 MG 24 hr capsule Take 1 capsule (20 mg total) by mouth every morning. 30 capsule 0   cloNIDine (CATAPRES) 0.2 MG tablet TAKE 1 TABLET BY MOUTH TWICE A DAY 60 tablet 2   Dextromethorphan-buPROPion ER (AUVELITY) 45-105 MG TBCR Take 1 tablet by mouth 2 (two) times daily. 60 tablet 3   iron polysaccharides (NIFEREX) 150  MG capsule TAKE 1 CAPSULE BY MOUTH EVERY DAY 90 capsule 1   LORazepam (ATIVAN) 1 MG tablet Take 1 tablet (1 mg total) by mouth every 8 (eight) hours as needed. for anxiety 90 tablet 1   losartan (COZAAR) 50 MG tablet Take 50 mg by mouth daily.     nebivolol (BYSTOLIC) 2.5 MG tablet Take 2.5 mg by mouth daily.     pramipexole (MIRAPEX) 0.5 MG tablet Take 1 tablet (0.5 mg total) by mouth 3 (three) times daily. 270 tablet 1   traZODone (DESYREL) 50 MG tablet TAKE 1-2 TABLETS BY MOUTH NIGHTLY AS NEEDED FOR SLEEP 180 tablet 1   buPROPion ER (WELLBUTRIN SR) 100 MG 12 hr tablet Take 1 tablet (100 mg total) by mouth 2 (two) times daily. (Patient not taking: Reported on 09/02/2023) 60 tablet 1    Dextromethorphan HBr 15 MG TABS Take 45 mg by mouth 2 (two) times daily. (Patient not taking: Reported on 09/02/2023) 180 tablet 1   No current facility-administered medications for this visit.    Medication Side Effects: None  Allergies:  Allergies  Allergen Reactions   Metronidazole Shortness Of Breath and Other (See Comments)    Heart pounding   Ferrlecit [Na Ferric Gluc Cplx In Sucrose] Other (See Comments)    Infusion reaction 05/12/2019    Past Medical History:  Diagnosis Date   ADHD    Anemia    Anxiety    Arthritis    Depression    Heart murmur    i went to see a cardiologit slast eyar  and i had zero plaque,    PONV (postoperative nausea and vomiting)    Recovering alcoholic in remission (HCC)     Family History  Problem Relation Age of Onset   Atrial fibrillation Mother    CAD Father     Past Medical History, Surgical history, Social history, and Family history were reviewed and updated as appropriate.   Please see review of systems for further details on the patient's review from today.   Objective:   Physical Exam:  There were no vitals taken for this visit.  Physical Exam Constitutional:      General: She is not in acute distress. Neurological:     Mental Status: She is alert and oriented to person, place, and time.     Coordination: Coordination normal.     Gait: Gait normal.  Psychiatric:        Attention and Perception: Attention and perception normal.        Mood and Affect: Mood is anxious. Mood is not depressed. Affect is not tearful.        Speech: Speech is not rapid and pressured.        Behavior: Behavior is not hyperactive.        Thought Content: Thought content is not paranoid or delusional. Thought content does not include homicidal or suicidal ideation. Thought content does not include suicidal plan.        Cognition and Memory: Cognition normal. Memory is not impaired.        Judgment: Judgment normal.     Comments: Insight  intact. No auditory or visual hallucinations. No delusions.  No depression at this time and very positive affect and expressive normally for her. No manic sx     Lab Review:     Component Value Date/Time   NA 137 01/12/2021 1430   NA 140 11/18/2018 1544   K 3.8 01/12/2021 1430   CL 108  01/12/2021 1430   CO2 22 01/12/2021 1430   GLUCOSE 94 01/12/2021 1430   BUN 14 01/12/2021 1430   BUN 20 11/18/2018 1544   CREATININE 0.82 01/12/2021 1430   CALCIUM 8.9 01/12/2021 1430   PROT 6.6 01/12/2021 1430   ALBUMIN 3.9 01/12/2021 1430   AST 12 (L) 01/12/2021 1430   ALT 11 01/12/2021 1430   ALKPHOS 46 01/12/2021 1430   BILITOT 0.5 01/12/2021 1430   GFRNONAA >60 01/12/2021 1430   GFRAA >60 09/02/2019 0249   GFRAA >60 01/27/2019 0811       Component Value Date/Time   WBC 4.5 01/12/2021 1430   RBC 4.32 01/12/2021 1430   HGB 12.8 01/12/2021 1430   HGB 12.9 07/17/2019 0953   HCT 38.5 01/12/2021 1430   HCT 21.9 (L) 12/25/2018 1221   PLT 272 01/12/2021 1430   PLT 286 07/17/2019 0953   MCV 89.1 01/12/2021 1430   MCH 29.6 01/12/2021 1430   MCHC 33.2 01/12/2021 1430   RDW 12.4 01/12/2021 1430   LYMPHSABS 1.4 01/12/2021 1430   MONOABS 0.4 01/12/2021 1430   EOSABS 0.0 01/12/2021 1430   BASOSABS 0.0 01/12/2021 1430    No results found for: "POCLITH", "LITHIUM"   No results found for: "PHENYTOIN", "PHENOBARB", "VALPROATE", "CBMZ"   .res Assessment: Plan:    Recurrent major depression resistant to treatment (HCC) - Plan: Dextromethorphan HBr 15 MG TABS, buPROPion ER (WELLBUTRIN SR) 100 MG 12 hr tablet, pramipexole (MIRAPEX) 0.5 MG tablet  Attention deficit hyperactivity disorder (ADHD), predominantly inattentive type - Plan: amphetamine-dextroamphetamine (ADDERALL XR) 20 MG 24 hr capsule, amphetamine-dextroamphetamine (ADDERALL XR) 20 MG 24 hr capsule  Generalized anxiety disorder  Insomnia due to mental condition   She has treatment resistant major depression  with 100% better  with Spravato.  Previously  discussed some of her  abnormal behaviors last year leading to this depressive episode getting worse which she says were associated with heavy use of delta 8 and not a manic episode.  She realizes now that that was not good for her.  She stopped all use of other drugs including those available over-the-counter such as delta 8 or any other THC related products.  She is no longer having any of those types of behaviors and instead is depressed.  Depression nearly resolved with pramipexole  0.5 TID and relapsed when out of Auvelity. Anhedonia resolved.  Depression resolved recently after increasing pramipexole 0.5 mg TID, brief relapse when out of Auvelity Consider switch to ER to reduce sleepiness if needed but better.   has failed all major categories of antidepressants MAO inhibitors which have not been tried.  She is tolerating Auvelity.  Continue 1  BID bc more depressed without it dramatically.  Relapsed witout it.  Worse with less pramipexole. Increased to 0.5 mg TID on about end of Feb 2024 and markedly better Dosing range off label for depression ranges from 1-5 mg daily.  Disc risk compulsive impulsive behavior and mania.    Started Spravato 84 mg twice weekly on 03/16/2022.  Stopped bc MCR cost is too high.  So far is ok but not perfect.  Confidence not back yet.  Adderall  XR 20 mg AM   Ativan 1 mg 3 times daily as needed anxiety but try to cut it back.  She still feels she needs it but only at the family stress.  Otherwise she has been able to avoid it. Is not ideal to use benzodiazepine with stimulant but because of the severity of  her symptoms it has been necessary.  Hope to eventually eliminate the benzodiazepine.  Encourage her to embrace this goal.    Continue clonidine 0.2 mg BID off label for anxiety and helps BP partially. BP is better controlled and more consistent.Rec keep track of BP and discuss with PCP. It has been up and down.  Sometimes needs  extra clonidine before Spravato.  Better lately.  Discussed potential benefits, risks, and side effects of stimulants with patient to include increased heart rate, palpitations, insomnia, increased anxiety, increased irritability, or decreased appetite.  Instructed patient to contact office if experiencing any significant tolerability issues. She wants to return to usual dose of Adderall for ADD bc of mor poor cognitive function with reduction.  Also discussed that depression will impair cognitive function.  Disc risk polypharmacy.  Relapsed after running out of Auvelity so needs clearly it and pramipexole.   She continues walkly daily with benefit. And journaling.   Has Maintained sobriety.    Continue Adderall XR 20 mg every morning Continue Auvelity 1 tablet twice daily with some samples Will try to use generic alternative and the more complicated issues with it.  continue clonidine 0.2 mg twice daily for anxiety and blood pressure. Continue lorazepam 1 mg 3 times daily she has been unable to cut back on the dose due to anxiety. Continue pramipexole 0.5 mg 3 times daily off label for dep; effective but disc potential increase. Continue trazodone 50 mg tablets 1-2 nightly as needed for sleep.  FU 4 - 6 weeks  Meredith Staggers, MD, DFAPA     Please see After Visit Summary for patient specific instructions.  Future Appointments  Date Time Provider Department Center  10/24/2023 10:00 AM Cottle, Steva Ready., MD [facility] None                         No orders of the defined types were placed in this encounter.      -------------------------------

## 2023-09-16 ENCOUNTER — Other Ambulatory Visit: Payer: Self-pay | Admitting: Psychiatry

## 2023-09-16 DIAGNOSIS — F339 Major depressive disorder, recurrent, unspecified: Secondary | ICD-10-CM

## 2023-09-17 NOTE — Telephone Encounter (Signed)
$  478 

## 2023-09-17 NOTE — Telephone Encounter (Signed)
I spoke with Laura Chang to confirm that she is taking Wellbutrin (she has not picked up rx).  note from 10/21 said pt not taking.  She reports she is about half way through her supply of Auvlity. She mentioned that it's 478 and you two are trying to come up with a plan. She is uncertain how to take the Wellbutrin, I read her the sig, she mentioned that she thought it was going to be mixed with another medication. I read back through the last office note and didn't see anything with that information. Is it appropriate to fill for 90 days?

## 2023-09-19 ENCOUNTER — Other Ambulatory Visit: Payer: Self-pay

## 2023-09-19 DIAGNOSIS — F339 Major depressive disorder, recurrent, unspecified: Secondary | ICD-10-CM

## 2023-09-23 ENCOUNTER — Encounter: Payer: Self-pay | Admitting: Hematology

## 2023-09-24 ENCOUNTER — Encounter (INDEPENDENT_AMBULATORY_CARE_PROVIDER_SITE_OTHER): Payer: Self-pay

## 2023-09-27 ENCOUNTER — Telehealth: Payer: Self-pay | Admitting: Psychiatry

## 2023-09-27 NOTE — Telephone Encounter (Signed)
Error

## 2023-09-30 ENCOUNTER — Encounter (INDEPENDENT_AMBULATORY_CARE_PROVIDER_SITE_OTHER): Payer: Self-pay

## 2023-10-24 ENCOUNTER — Encounter: Payer: Self-pay | Admitting: Psychiatry

## 2023-10-24 ENCOUNTER — Ambulatory Visit: Payer: Medicare Other | Admitting: Psychiatry

## 2023-10-24 DIAGNOSIS — F339 Major depressive disorder, recurrent, unspecified: Secondary | ICD-10-CM | POA: Diagnosis not present

## 2023-10-24 DIAGNOSIS — F5105 Insomnia due to other mental disorder: Secondary | ICD-10-CM | POA: Diagnosis not present

## 2023-10-24 DIAGNOSIS — F411 Generalized anxiety disorder: Secondary | ICD-10-CM

## 2023-10-24 DIAGNOSIS — F900 Attention-deficit hyperactivity disorder, predominantly inattentive type: Secondary | ICD-10-CM

## 2023-10-24 DIAGNOSIS — F1021 Alcohol dependence, in remission: Secondary | ICD-10-CM

## 2023-10-24 MED ORDER — AMPHETAMINE-DEXTROAMPHET ER 20 MG PO CP24: 20.0000 mg | ORAL_CAPSULE | ORAL | 0 refills | Status: DC

## 2023-10-24 MED ORDER — BUPROPION HCL ER (SR) 100 MG PO TB12: 100.0000 mg | ORAL_TABLET | Freq: Two times a day (BID) | ORAL | 0 refills | Status: DC

## 2023-10-24 MED ORDER — LORAZEPAM 1 MG PO TABS: 1.0000 mg | ORAL_TABLET | Freq: Three times a day (TID) | ORAL | 1 refills | Status: DC | PRN

## 2023-10-24 MED ORDER — PRAMIPEXOLE DIHYDROCHLORIDE 0.5 MG PO TABS
0.5000 mg | ORAL_TABLET | Freq: Three times a day (TID) | ORAL | 1 refills | Status: DC
Start: 2023-10-24 — End: 2024-07-08

## 2023-10-24 NOTE — Progress Notes (Signed)
KAYDYN BASA [id number] 1956/05/08 67 y.o.  Subjective:   Patient ID:  CORAMAE KELLENBERGER is a 67 y.o. (DOB Aug 08, 1956) female.  Chief Complaint:  Chief Complaint  Patient presents with   Follow-up   Depression   Anxiety   Sleeping Problem   ADD      Yaminah D Anessia Jaffe presents to the office today for follow-up of depression and anxiety and ADD.  seen November 12, 2019.  Melted down in 2020.  Went to [facility] in July.  No withdrawal.  1 drink since then.  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 She was on no stimulant and no SSRI but was taking Strattera and Wellbutrin.  The following changes were made. Stop Strattera. OK restart stimulant bc severe ADD Restart Adderall 1 daily for a few days and if tolerated then restart 1 twice daily. If not tolerated reduce the dosage if needed. May need to stop Wellbutrin if not tolerating the stimulant.  Yes.  DC Wellbutrin Restart Prozac 20 mg daily.  February 2021 appointment with the following noted: Completed grant proposal.  Couldn't doit without Adderall.  Sold a bunch of work.   Adderall XR lasts about 3 pm.  Strength seems about right.  BP been OK.  Not jittery.   Stopped Wellbutrin but had no SE. Mood drastically better with grant proposal and back on fluoxetine.  Less depressed and lethargic.  No anxiety.  Cut back on coffee. Started back with devotions and stronger faith. Plan: Continue Prozac 20 mg daily. May have to increase the dose at some point in the future given that she usually was taking higher dosages but she is getting good response at this time. Restart Wellbutrin off label for ADD since can't get 2 ADDERALL daily. 150 mg daily then 300 mg daily. She can adjust the dose between 150 mg and 300 mg daily to get the optimal effect.   05/11/2020 appointment with the following noted: Has been inconsistent with Prozac and Wellbutrin. Not sure of the effect of Wellbutrin. Biggest deterrent in  work is anxiety.  Some of the work is conceptual and difficult at times.  Can feel she's not up to a project at times.  Overall is OK but would like a steadier benefit from stimulant.  Exhausted from managing concentration and keeping up with things from the day.  Loses things.  Not good keeping up with schedule. Overall productive and emotionally OK. Can feel Adderall wear off. Mood is better in summer and worse in the winter.   F died in October 02, 2023 and that is a loss. No SE Wellbutrin. Still attends AA meetings.  Real benefit from [facility] last year. Recognizes effect of anemia on ADD and mood.  Had iron infusions last winter. Plan:  Wellbutrin off label for ADD since can't get 2 ADDERALL daily. 150 mg daily then 300 mg daily.  01/24/2021 appointment with following noted: Doing a program called [employer] mindfulness app since Xmas.  CBT app helped the depression.  App helped her focus better.  Lost sign weight. Writing a lot. Before Xmas felt depressed and started negative thinking worse, self denigrating. Not drinking. More isolated.   Recognizes mo is narcissist.    Didn't tell anyone she was born until 3 mos later.  M aloof and uninterested in pt.  Lied about her birthday.  Mo lack of affection even with pt's kids. Going to AA for a year and it helped her to quit drinking. Also  misses kids being gone with a hole also. Plan: No med changes  05/04/2021 appointment with the following noted: Therapist [name] thinks she's manic. Lost weight to 144#.   States she is still sleeping okay.  Admits she is hyper and recognizes that she is likely manic.  She feels great, euphoric with an increased sense of spiritual connectedness to God.  She has racing thoughts and talks fast and talks a lot and this is noted by her husband.  He thinks she is a bit hyper.  She has been able to maintain sobriety although she will have 1 glass of wine on special occasions but does not drink by herself.  She  is not drinking to excess.  She denies any dangerous impulsivity.  She is clearly not depressed and not particularly anxious.  She has no concerns about her medication and she has been compliant.  06/16/21 appt noted: So much better.  Going through a lot but the manic thing happened on top of it.  So much slower.  Didn't feel like losing anything with risperidone.  Likes the Adderalll at 10 mg. Some drowsiness in the AM and very drowsy from risperidone 2 mg HS. Prayer life is better. Handling stress better. Less depressed with risperidone. Still likes trazodone. Sleeps well. Plan: Reduce Prozac to 10 mg daily.  Consider stopping it because it can feel the mania however she is reluctant to do that because she fears relapse of depression. Reduce risperidone to 1.5 mg nightly due to side effects.  Discussed risk of worsening mania.  07/25/2021 appointment with the following noted: Misses the Adderall and hard to function without it. Depressed now. Heavy chest.  Anxious and guilty.  Body feels heavy.   Hates Wellbutrin.   Plan: Increase fluoxetine to 20 mg daily Add Abilify 1/2 of 15 mg tablet daily Wean wellbutrin by 1 tablet each week  bc she feels it is not helpful and DT polypharmacy Reduce risperidone to 1 daily for 1 week and stop it. Disc risk of mania. Increase Adderall to XR 20 mg AM  08/08/21 Much less depressed and starting to feel normal I feel a lot better. No SE.  Speech normal off risperidone. Sleeping OK on trazaodone and enough.   Noticed benefit from Adderall again. Plan: continue fluoxetine to 20 mg daily Continue Abilify 1/2 of 15 mg tablet daily for depression and mania continue Adderall to XR 20 mg AM  10/10/2021 phone call: Pt stated she feels like the Abilify should be decreased to 5mg .She said she is depressed but rational and not suicidal.She has an appt Monday and can wait until then if you prefer. MD response: Reduce the Abilify to 7.5 mg every other day.  We will  meet on 10/16/2021 and decide what to do from there.  10/16/2021 appointment with the following noted: More depressed.  Most depressed I've ever been.  Just numb.  Sense of grief.   Thinks the manic episode was unlike anything else she ever had.  Doesn't want to medicate against it.  Don't enjoy people.  Easily overwhelmed.  Had some death thoughts but not suicidal.  Has been functional.  Feels better today after reducing Abilify to every other day but she is only been doing that for 3 days. A/P: Episode of post manic depression was explained. continue fluoxetine to 20 mg daily Hold Abilify for 1 week then resume Abilify 1/2 of 15 mg tablet every other day for depression and mania continue Adderall to XR 20  mg AM  10/27/2021 appointment with the following noted: I'm doing so much better.  Handling the depression better. Better self talk and spiritual focus has helped.   Dep 6/10 manifesting as anxiety with low confidence.   F died 2  years ago and M 67 yo and is dependent . She is working hard to feel better but still feels depressed.  She almost feels like she has a little more anxiety since restarting Abilify every other day. Plan: continue fluoxetine to 20 mg daily DC Abilify .  Vrayalar 1.5 mg QOD to try to get rid of depression ASAP. continue Adderall to XR 20 mg AM  11/10/2021 appointment with the following noted: Busy with Xmas and it was fun with family but then a big let down.  Did well with it.  Functioned well with it.  Working hard on things with depression.  Not shutting down. Not sure but feels better today but yesterday was hard.  Difficulty dealing with mother.  She won't do anything to help herself.  Yesterday with her all day.  Won't do PT and has isolated herself.    Lack of confidence.   No SE with Vraylar.  11/24/21 urgent appointment appt noted: More and more depressed.   So anxious and doesn't want to be alone but can do so. No appetite. Hurts inside. Has had some  fleeting suicidal thoughts but would not act on them.  Tolerating meds. Has been consistent with Vraylar 1.5 mg every other day, fluoxetine 20 mg daily Plan: Increase Vraylar to 1.5 mg daily Change Prozac to Trintellix 10 mg daily. Discussed side effects of each continue Adderall to XR 20 mg AM  12/27/2021 appointment with the following noted: Not OK.  I feel less depressed but feels bat shit. Not sleeping well.  Extremely anxious. Off and on sleep. 3-4 hours of sleep.   Still having daily SI.  But also become obvious has so much to do.  Overwhelmed by tasks.   Needs anxiety meds to just function. Not more motivated.  Walked yesterday.   Feels afraid like in trouble but not irritable or angry. DC DT agitation Vraylar to 1.5 mg daily Change Prozac to Trintellix 10 mg daily. Hold Adderall to XR 20 mg AM Clonidine 0.1 1/2 tablet twice daily for 2 days and if needed for anxiety and sleep increase to 1 twice daily Ok temporary Ativan 1 mg 3 times daily as needed anxiety  01/05/22 appt noted: Off fluoxetine and  Trintellix.  Only on Ativan, trazodone and Adderall XR 20 plus added clonidine 0.1 mg BID Didn't think she needed to start Trintellix. Not taking Ativan.   Didn't like herself last week. Feels some better today. Wonders if the manic sx Not agitated.  Anxiety kind of calmed down.  A lot to be anxious about situationally.  $ stress. Concerns about downers with meds. Can't access normal personality. ? Lethargy and inability to talk as sE. Plan: Latuda 20-40 mg daily with food. Adderall to XR 20 mg AM Clonidine 0.1 1/2 tablet twice daily  reduce dose to be sure no SE Ok temporary Ativan 1 mg 3 times daily as needed anxiety  01/19/22 appt noted: Taking Latuda 20 mg daily.  Took 40 mg once and felt anxious and  SI Still depressed and not very reactive Anxiety mainly about the depression and fears of the future. She wants to revisit manic sx and thinks it was maybe bc taking delta 8  bc was taking a lot  of it so still doesn't think she's classic bipolar. She wants to only take Prozac bc thinks Latuda is perpetuating depression. Says the delta 8 was very psychaedelic.  When not taking it was not manic.  Sleeping ok again.  Plan: Per her request DC Latuda 20-40 mg daily with food. She wants to continue Prozac alone AMA  Adderall to XR 20 mg AM Clonidine 0.1 1/2 tablet twice daily  reduce dose to be sure no SE Ok temporary Ativan 1 mg 3 times daily as needed anxiety  01/23/2022 phone call complaining of increased anxiety since stopping Latuda.  She will try increasing clonidine.  01/26/2022 phone call not feeling well and wanted to restart the Vraylar.  However notes indicate that had made her agitated therefore she was encouraged to pick up samples of Rexulti 1 mg and start that instead.  02/06/2022 phone call: Stating she felt the Rexulti was helping with depression but she was not sleeping well and obsessing over things.  She was encouraged to increase Rexulti to 2 mg daily and increase trazodone for sleep.  02/09/2022 appointment with the following noted: This was an urgent work in appointment No sleep last night with trazodone 100 mg HS Nothing really better depression or anxiety. Ruminating negative anxious thoughts. Did not tolerate Rexulti because it was causing insomnia.  Does not think it helped depression.  Lacks emotion that she should have.  Lacks her usual personality.  Some hopeless thoughts.  Some death thoughts.  Some suicidal thoughts without plan or intent Plan: DC Rexulti and Prozac & DC trazodone Adderall to XR 20 mg AM Clonidine 0.1 1/tablet twice daily  reduce dose to be sure no SE Ok temporary Ativan 1 mg 3 times daily as needed anxiety Start Seroquel XR 150 mg nightly  03/02/2022 appointment: Angelique Blonder called back a few days after starting Seroquel stating it was making her more anxious and more depressed.  This seemed unlikely as this medicine rarely  ever causes anxiety.  She stopped the medication waited 3 days and called back still had anxiety and depression but thought perhaps the anxiety was a little better.  She did not want to take the Seroquel. She knew about the option of Spravato and wanted to pursue that. Now questions whether to return to Seroquel while waiting to start Spravato bc feels just as bad without it and knows she didn't give it enough time to work.   MADRS 46  ECT-MADRS    Flowsheet Row Clinical Support from 07/11/2023 in [facility] Clinical Support from 01/08/2023 in [facility] Clinical Support from 08/06/2022 in [facility] Clinical Support from 07/04/2022 in [facility] Clinical Support from 05/21/2022 in [facility]  MADRS Total Score 9 21 29 15 27       03/14/22 appt noted: Pt received Spravato 56 mg first dose today with some dissociative sx which were not severe.  She was anxious prior to the administration and felt better after receiving lorazepam 1 mg.  No NV, or HA. Wants to continue Spravato. Ongoing depression and desperate to feel better.  I'm not myself DT deprsssion which is most severe in recent history.  Anhedonia.  Low motivation.  Social avoidance. Continues to think all recent med trials are making her worse.  Sleep ok with Seroquel.  03/16/22 appt noted: Received Spravato 84 mg for the first time.  some dissociative sx which were not severe.  She  was anxious prior to the administration and felt better after receiving lorazepam 1 mg.  No NV, or HA. Wants to continue Spravato.   Does not feel any better or different since the last appt.  Ongoing depression.  Ongoing depression and desperate to feel better.  I'm not myself DT deprsssion which is most severe in recent history.  Anhedonia.  Low motivation.  Social avoidance. Continues to think all recent med trials are  making her worse.  Sleep ok with Seroquel.  Does not want to continue Seroquel for TRD.  03/20/2022 appointment noted: Came for Spravato administration today.  However blood pressure was significantly elevated approximately 180/115.  She was given lorazepam 1 mg and clonidine 0.2 mg to try to get it down. She states she regretted stopping the Seroquel XR 300 mg tablets.  She now realizes it was helpful.  She did not sleep much at all last night.  She did not take the Adderall this morning. 2 to 3 hours after arrival blood pressure was still elevated at  170/110, 62 pulse.  For Spravato administration was canceled for today.  She admits to being anxious and depressed.  She is not suicidal.  She is highly motivated to receive the Spravato.  We discussed getting it tomorrow.  03/22/2022 appointment noted: Patient's blood pressure was never stable enough yesterday in order to get her in for Spravato administration.  She was encouraged to see her primary care doctor.  It is better today.  03/26/2022 appointment with the following noted: Blood pressure was better.  Saw her primary care doctor who started on oral Bystolic 2.5 mg daily. Received Spravato 84 mg today as scheduled.  Tolerated it well without nausea or vomiting headache or chest pain or palpitations.  Her blood pressure was borderline but manageable. She remains depressed and anxious.  She is ambivalent about the medicine and desperate to get to feel better.  Continues to have anhedonia and low energy and low motivation and reduced ability to do things.  Less social.  Not suicidal.  03/28/22 appt noted: Received Spravato 84 mg today as scheduled.  Tolerated it well without nausea or vomiting headache or chest pain or palpitations.  Her blood pressure was borderline but manageable. Has not seen any improvement so far.  Tolerating Seroquel.  Inconsistent with Bystolic and BP has been borderline high. Still depressed and anxious and anhedonia.  Low  motivation, energy, productivity. Taking quetiapine and tolerating XR 300 mg nightly.  04/04/22 appt noted: Received Spravato 84 mg today as scheduled.  Tolerated it well without nausea or vomiting headache or chest pain or palpitations.  Her blood pressure was borderline but manageable. Has not seen any improvement so far.  Tolerating Seroquel.   She still tends to think that the medications are making her worse.  She has said this about each of the recent psychiatric medicines including Seroquel.  However her husband thinks she is improved.  She also admits there is some improvement in productivity.  She still feels highly anxious.  She still does not enjoy things as normal.  She still feels desperate to improve as soon as possible. Has been taking Seroquel XR since 03/20/2022  04/10/22 appt noted: Received Spravato 84 mg today as scheduled.  Tolerated it well without nausea or vomiting headache or chest pain or palpitations.  Her blood pressure was borderline but manageable. Has not seen any improvement so far.  Tolerating Seroquel.  Doesn't like Seroquel bc she thinks it flattens here. 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  Tolerated it well without nausea or vomiting headache or chest pain or palpitations.  Her blood pressure was borderline but manageable. Has not seen any improvement so far.  Tolerating Seroquel.  Doesn't like Seroquel bc she thinks it flattens her. Received Spravato 84 mg today as scheduled.  Tolerated it well without nausea or vomiting headache or chest pain or palpitations.  Her blood pressure was borderline but manageable. Has not seen any improvement so far.  Tolerating Seroquel.  Doesn't like Seroquel bc she thinks it flattens here.  We discussed her ambivalence about it. She is starting Auvelity  and has tolerated it the last 2 days without side effect.  She still does not feel like herself and feels flat and not enjoying things with suppressed expressed emotion  04/17/2022 appointment with the following noted: Received Spravato 84 mg today as scheduled.  Tolerated it well without nausea or vomiting headache or chest pain or palpitations.  Her blood pressure was borderline but manageable. Has not seen any improvement so far.  Tolerating Seroquel.  Doesn't like Seroquel bc she thinks it flattens her. She has been tolerating the Auvelity 1 in the morning without side effects for about a week.  She has not noticed significant improvement so far.  She still feels depressed and flat and not herself.  Other people notice that she is flat emotionally.  She is not suicidal.  She does feel discouraged that she is not getting better yet.  04/19/2022 appointment noted: Has increased Auvelity to 1 twice daily for 2 days, continues quetiapine XR 300 mg nightly, clonidine 0.3 mg twice daily, lorazepam 1 mg twice daily for anxiety and Adderall XR 20 mg in the morning. No obious SE but she still thinks quetiapine XR is making her feel down.  But not sedated Received Spravato 84 mg today as scheduled.  Tolerated it well without nausea or vomiting headache or chest pain or palpitations.  Her blood pressure was borderline but manageable. She still feels quite anxious and feels it necessary to take both the clonidine and lorazepam twice a day to manage her anxiety.  She has been consistently down and flat and not herself until yesterday afternoon she noted an improvement in mood and feeling much more like herself with her normal personality reemerging.  She was quite depressed in the morning with very dark negative thoughts.  She did not have those dark negative thoughts this morning.  She had a lot of questions about medication and when she was expecting to be improved and why she has not shown improvement up to  now.  04/23/22 appt noted: Has increased Auvelity to 1 twice daily for 1 week, continues quetiapine XR 300 mg nightly, clonidine 0.3 mg twice daily, lorazepam 1 mg twice daily for anxiety and Adderall XR 20 mg in the morning. No obious SE but she still thinks quetiapine XR is making her feel down.  But not sedated Received Spravato 84 mg today as scheduled.  Tolerated it well without nausea or vomiting headache or chest pain or palpitations.  She is still depressed but admits better function and is able to enjoy social interactions. Tolerating meds.  Would like to feel better for sure. Not herself.  Flat. Plan increase Auvelity to 1 tab BID as planned and reduce Quetiapine to 1/2 of ER 300 mg  bc  NR for depression.  04/25/2022 appointment with the following noted: clonidine 0.3 mg twice daily, lorazepam 1 mg twice daily for anxiety and Adderall XR 20 mg in the morning. Seroquel XR 300 HS No obious SE but she still thinks quetiapine XR is making her feel down.  But not sedated Received Spravato 84 mg today as scheduled.  Tolerated it well without nausea or vomiting headache or chest pain or palpitations.  Called yesterday with more anxiety.  Had increased Auvelity for 1 day and reduced Seroquel XR for 1 day.  Felt restless and fearful  05/01/2022 appointment noted: clonidine 0.3 mg twice daily, lorazepam 1 mg twice daily for anxiety and Adderall XR 20 mg in the morning. Seroquel XR 150 HS, Auvelity 1 BID Received Spravato 84 mg today as scheduled.  Tolerated it well without nausea or vomiting headache or chest pain or palpitations.  Nurse has noted patient has called multiple times sometimes asking the same question repeatedly.  It is unclear whether she is truly forgetful or is just anxious seeking reassurance. Patient acknowledges ongoing depression as well as some anxiety but states she has felt a little better in the last couple of days.  She has reduced the Seroquel to 150 mg at night and has  increased Auvelity to 1 twice daily but only for 1 day.  So far she seems to be tolerating it.  05/03/22 appt noted: clonidine 0.2 mg twice daily, lorazepam 1 mg twice daily for anxiety and Adderall XR 20 mg in the morning. Seroquel XR 150 HS, Auvelity 1 BID BP high this am about 170/100 and received extra clonidine 0.2 mg and came to receive Spravato.  Not dizzy, no SOB, nor CP but BP is still high Could not receive Spravato today bc BP high and pulse low at 30 ppm. Still depressed and anxious. Plan: continue trial Auvelity with Spravato She needs to get BP and pulse managed  05/08/22 TC: RTC  H Michael NA and mailbox full.  Could not leave message.  Pt  -  talked to she and H on speaker. H worried over wife.  Vacant stare.  Slurs words at times.  Not smiling. Reduced enjoyment.  Depression.  Withdrawn from usual activities.  Some irritability.  Anxious. Disc her concerns meds are making her worse.  Extensive discussion about her treatment resistant status.  There is a consistent pattern of not taking the medicines long enough to get benefit because she believes the meds are making her worse.  However the symptoms she describes as side effects are exactly the same symptoms that she had prior to taking the medication RX for  the depression.  So it is not clear that these are actual side effects. This is true about the 2 most recent meds including Seroquel and Auvelity.  Recommend psychiatric consultation in hopes of improving her comfort level with taking prescribed medications for a sufficient length of time to provide benefit. Extensive discussion about ECT is the treatment of choice for treatment resistant depression.  Spravato may work if she can comply with consistency.  There are medication options but they take longer to work.   Plan:  Reduce clonidine to 0.1 mg BID DT bradycardia.  Talk with PCP about BP and low pulse problems which are interfering with her consistent compliance with  Spravato.   Limit lorazepam to 3 -4  mg daily max. Excess use is the cause of slurring speech.  She must stop excess use or will have to stop the  med. Stop Auvelity per her request.  But she has only been on the full dose for a little over a week and clearly has not had time to get benefit from it.  She thinks maybe it is making her more anxious. Reduce Seroquel from 150XR to 50 -100 mg at night IR.  She couldn't sleep when stopped it completely. Will not start new antidepressant until her SE issues are resolved or not. Get second psych opinion from Wellington Hampshire MD or another psychiatrist.  H's sister is therapist in [place] Rink, MD, [facility]  05/16/2022 appointment with the following noted: Received Spravato 84 mg today as scheduled.  Tolerated it well without nausea or vomiting headache or chest pain or palpitations.  She stopped Auvelity as discussed last week. On her own, without physician input, she restarted Wellbutrin XL 450 mg every morning today.  She had taken it in the past.  She feels jittery and anxious. She feels less depressed than she did last week.  But she is still depressed without her usual range of affect.  She still is less social and less motivated than normal. Her primary care doctor increased the dose of losartan Plan: Stop Seroquel Reduce Wellbutrin XL to 300 mg every morning.  Starting the dose at 450 every morning is likely causing side effects of jitteriness and it should not be started at that have a dosage. Recommend she not change meds on her own without MDM put  05/23/2022 appointment with the following noted: Received Spravato 84 mg today as scheduled.  Tolerated it well without nausea or vomiting headache or chest pain or palpitations.  Has not dropped seroquel XR 300 mg 1/2 tablet nightly bc couldn't sleep without it. Has not tried lower dose quetiapine 50 mg HS Still feels depressed.   BP is better managed so far, just saw PCP.  BP is better today and  infact is low today. Dropped clonidine as directed from 0.3 mg BID bc inadequate control of BP to 0.2 mg BID.  However she wants to increase it back to 0.3 mg twice daily because she feels it helped her anxiety better.  Wonders about increasing Wellbutrin for depression.  However she has only been on 300 mg a day for a week.  She was on 450 mg daily in the past.  06/06/22 appt noted: Received Spravato 84 mg today as scheduled.  Tolerated it well without nausea or vomiting headache or chest pain or palpitations.  She is still depressed and anxious.  She wants to try to stop the Seroquel but cannot sleep without some of it.  She is taking lorazepam 1 mg 4 times daily and still having a lot of anxiety.  She wants to increase clonidine back to 0.3 mg twice daily.  She hopes for more improvement She recently went for a second psychiatric opinion as suggested the results of that are pending.  06/11/22 appt noted: Received Spravato 84 mg today as scheduled.  Tolerated it well without nausea or vomiting headache or chest pain or palpitations.  She is still depressed and anxious. Without much change.  Still hopeless, anhedonia, reduced inteterest and motivation.  Tolerating meds. Disc concerns Spravato is not hleping much. Plan: stop Seroquel and start olanzapine 10 mg HS for TRD and anxiety.  06/13/2022 appointment noted: Received Spravato 84 mg today as scheduled.  Tolerated it well without nausea or vomiting headache or chest pain or palpitations.  She is still depressed and anxious. Without much change.  Still  hopeless, anhedonia, reduced inteterest and motivation.  Tolerating meds. Disc concerns Spravato is not helping much as hoped but is improving a bit in the last week. Tolerating meds. Continues Wellbutrin XL 450 AM, tolerating recently started olanzapine  10 mg HS. Sleep is good.   Pending appt with TMS consult.  06/18/22 appt noted: Received Spravato 84 mg today as scheduled.  Tolerated it well  without nausea or vomiting headache or chest pain or palpitations.  Tolerating meds. Continues Wellbutrin XL 450 AM, tolerating recently started olanzapine  10 mg HS. Continues Adderall XR 20 amd and has tried to reduce lorazepam to 1mg  TID Sleep is good.   Pending appt with TMS consult. Depression is a little bit better in the last week with a little improvement in emotional expression and interest.  She is pushing herself to be more active.  Her daughter thought she was a little better than she has been.  However she is still depressed and still not her normal self with anhedonia and reduced emotional expressiveness.  06/20/22 appt noted: Received Spravato 84 mg today as scheduled.  Tolerated it well without nausea or vomiting headache or chest pain or palpitations.  Tolerating meds with a little sleepiness. Continues Wellbutrin XL 450 AM, tolerating recently started olanzapine  10 mg HS. Continues Adderall XR 20 amd and has tried to reduce lorazepam to 1mg  TID Sleep is good.   Mood is improving.  Better funciton.  Anxiety is better with olanzapine. Still not herself and depression not gone with some anhedonia and social avoidance and feeling overwhelmed.  8/14 2023 received Spravato 84 mg 06/27/2022 received Spravato Spravato 84 mg 07/02/2022 received Spravato 84 mg 07/04/2022 received Spravato 84 mg  07/09/2022 appointment noted: Received Spravato 84 mg today as scheduled.  Tolerated it well without nausea or vomiting headache or chest pain or palpitations.  Expected dissociation and feels less depressed with resolution of negative emotions immediately after Spravato and then depression, anxiety creep back in. Continues meds Adderall XR 20 mg every morning, Wellbutrin XL 450 every morning, clonidine 0.1 mg twice daily, lorazepam 1 mg every 6 hours as needed, olanzapine increased from 7.5 to 10 mg nightly on  Tolerating meds.  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  She does note some increased appetite and is somewhat concerned about that but has not gained significant amounts of weight. She has had the TMS consultation which was initially denied but she knows it can be appealed.  However because she is improving with Spravato plus the other medications now she wants to continue the current treatment plan.  07/18/22 appt noted: Continues meds Adderall XR 20 mg every morning, Wellbutrin XL 450 every morning, clonidine 0.1 mg twice daily, lorazepam 1 mg every 6 hours as needed, olanzapine increased from 7.5 to 10 mg nightly on 07/04/2022. Received Spravato 84 mg today as scheduled.  Tolerated it well without nausea or vomiting headache or chest pain or palpitations.  Expected dissociation and feels less depressed with resolution of negative emotions immediately after Spravato and then depression, anxiety creep back in. Continues meds Adderall XR 20 mg every morning, Wellbutrin XL 450 every morning, clonidine 0.1 mg twice daily, lorazepam 1 mg every 6 hours as needed, olanzapine increased from 7.5 to 10 mg nightly on  Tolerating meds.  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  She does note some increased appetite and is somewhat concerned about that but has not gained significant amounts of weight. She has had the TMS consultation which was initially denied but she knows it can be appealed. She continues to have chronic ambivalence about psychiatric medicines and initially tends to blame her depressive symptoms such as decreased concentration and feeling flat on what ever medicine she currently is taking even though she had the same symptoms before the current medicines were started.  Then after discussion she does admit that her depressive  symptoms are improved since adding olanzapine but still has those residual symptoms noted.  07/23/22 received Spravato 84 mg   07/30/2022 appointment noted: Received Spravato 84 mg today as scheduled.  Tolerated it well without nausea or vomiting headache or chest pain or palpitations.  Expected dissociation and feels less depressed with resolution of negative emotions immediately after Spravato and then depression, anxiety creep back in. Continues meds Adderall XR 20 mg every morning, Wellbutrin XL 450 every morning, clonidine 0.1 mg twice daily, lorazepam 1 mg every 6 hours as needed, olanzapine increased from 7.5 to 10 mg nightly on  She has been inconsistent with olanzapine because she continues to be ambivalent about the medications in general and thinks that perhaps the 10 mg is making her feel blunted.  She continues to feel some depression.  She had a good day this week and but still feels somewhat depressed and persistently anxious. Plan: be consistent with olanzapine 10 mg HS for TRD and longer trial for potential benefit for anxiety.  Has not taken it consistently.  08/06/2022 appointment noted: Received Spravato 84 mg today as scheduled.  Tolerated it well without nausea or vomiting headache or chest pain or palpitations.  Expected dissociation and feels less depressed with resolution of negative emotions immediately after Spravato and then depression, anxiety creep back in. Continues meds Adderall XR 20 mg every morning, Wellbutrin XL 450 every morning, clonidine 0.1 mg twice daily, lorazepam 1 mg every 6 hours as needed, olanzapine i 10 mg nightly  She continues to feel depressed but is about 50% better with Spravato.  She is still not herself.  She still has anhedonia.  She still is not her able to engage socially in the typical ways.  She is not jovial and outgoing like normal.  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  She continues to feel that her personality is dampened down.  There is a question about whether it is related to depression or medication. Plan: continue olanzapine 10 for longer trial for TRD and severe anxiety.  08/13/22 appt noted:  Received Spravato 84 mg today as scheduled.  Tolerated it well without nausea or vomiting headache or chest pain or palpitations.  Expected dissociation and feels less depressed with resolution of negative emotions immediately after Spravato and then depression, anxiety creep back in. Continues meds Adderall XR 20 mg every morning, Wellbutrin XL 450 every morning, clonidine 0.1 mg twice daily, lorazepam 1 mg every 6 hours as needed, olanzapine i 10 mg nightly  She still does not feel herself.  Still struggles with depression and low motivation and reduced social engagement and reduced interest and reduced emotional expression.  She is somewhat better with the medicines plus Spravato.  She still believes the [employer]  makes her blunted and is not sure how much it helps her anxiety.  She can have good days when her family is around and she is engaged.  She still wants to stop the olanzapine. She has apparently continued to take the trazodone despite having been told to stop it when she started olanzapine.  She feels like she needs the trazodone. Plan: DC olanzapine and Start nortriptyline 25 mg nightly and build up to 75 mg nightly and then check blood level.    08/27/2022 appointment noted: Received Spravato 84 mg today as scheduled.  Tolerated it well without nausea or vomiting headache or chest pain or palpitations.  Expected dissociation and feels less depressed with resolution of negative emotions immediately after Spravato and then depression, anxiety creep back in. Continues meds Adderall XR 20 mg every morning, Wellbutrin XL 450 every morning, clonidine 0.1 mg twice daily, lorazepam 1 mg every 6 hours as needed. Stopped  olanzapine and started nortriptyline which she has taken for about a week is 75 mg nightly. So far she is tolerating the nortriptyline well with the exception of some dry mouth and constipation which she is working to manage.  She does not feel substantially better better or different off the olanzapine.  No change in her sleep which is good.  Main concern currently in addition to the residual depression is anxiety which is somewhat situational with pending arch show.  She is worrying about it more than normal.  Says she is having to take lorazepam twice a day where she had been able to keep reduce it prior to this.  She still does not feel like herself with residual depression with less social interest and less of her usual buoyancy in personality.  She is flatter than normal.  Overall she still feels that the Spravato has been helpful at reducing the severity of the depression.  She is not suicidal. She has not heard anything about the TMS appeal as of yet.  09/05/2022 appointment noted: Received Spravato 84 mg today as scheduled.  Tolerated it well without nausea or vomiting headache or chest pain or palpitations.  Expected dissociation and feels less depressed with resolution of negative emotions immediately after Spravato and then depression, anxiety creep back in. Continues meds Adderall XR 20 mg every morning, Wellbutrin XL 450 every morning, clonidine 0.1 mg twice daily, lorazepam 1 mg every 6 hours as needed. Stopped olanzapine and started nortriptyline which she has taken for about 2 week is 75 mg nightly. Initially blood pressure was a little high causing delay in starting Spravato.  She admitted to feeling a little wound up.  She still experiences a little increase in depression if she goes longer than a week in between doses of Spravato.  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  09/10/22 appt noted: Received Spravato 84 mg today as scheduled.  Tolerated it well without nausea or vomiting headache or chest pain or palpitations.  Expected dissociation gradually resolved over the 2 hour observation period. She feels 50% less depressed with Spravto and wants to continue it.   Continues meds Adderall XR 20 mg every morning, Wellbutrin XL 450 every morning, clonidine 0.1 mg twice daily, lorazepam 1 mg every 6 hours as needed. Has started nortriptyline 75 mg nightly for about 3 weeks. Has not seen a significant difference with the addition of nortriptyline.  Tolerating it  pretty well. She continues to have some degree of anhedonia and significant depression and anxiety.  Her daughters noticed that she is more needy and calls more frequently.  She acknowledges this as well.  She is clearly still not herself. Plan: pramipexole off label and RX 0.25 mg BID  09/17/2022 appointment noted: Received Spravato 84 mg today as scheduled.  Tolerated it well without nausea or vomiting headache or chest pain or palpitations.  Expected dissociation gradually resolved over the 2 hour observation period. She feels 50% less depressed with Spravto and wants to continue it.   Continues meds Adderall XR 20 mg every morning, Wellbutrin XL 450 every morning, clonidine 0.1 mg twice daily, lorazepam 1 mg every 6 hours as needed. Has started nortriptyline 75 mg nightly for about 3 weeks and DT level 176, reduced to 50 mg HS early November. Still the same sx as noted last visit.  Tolerating meds.   Compliant.  Still depressed and family notices.  Has been able to participate in family interactions.  Some post-show let down and has to do detailed work which is hard for her bc ADD.  Sleep and eating well.  Energy OK but not great.  No SI.  Not cried in a year or so.  Clearly less depressed and hopeless than before the Spravato.  09/24/22 appt noted: Received Spravato 84 mg today as scheduled.   Tolerated it well without nausea or vomiting headache or chest pain or palpitations.  Expected dissociation gradually resolved over the 2 hour observation period. She feels 50% less depressed with Spravto and wants to continue it.   Continues meds Adderall XR 20 mg every morning, Wellbutrin XL 450 every morning, clonidine 0.1 mg twice daily, lorazepam 1 mg every 6 hours as needed. Has started nortriptyline 75 mg nightly for about 3 weeks and DT level 176, reduced to 50 mg HS early November. Still the same sx as noted last visit.  Tolerating meds.   Compliant.  Still depressed and family notices.  Has been able to participate in family interactions.  Some post-show let down and has to do detailed work which is hard for her bc ADD.  Sleep and eating well.  Energy OK but not great.  No SI.  Not cried in a year or so.  Clearly less depressed and hopeless than before the Spravato. Is not making further progress generally.  Stuck with moderate depression  10/02/22 appt noted: Received Spravato 84 mg today as scheduled.  Tolerated it well without nausea or vomiting headache or chest pain or palpitations.  Expected dissociation gradually resolved over the 2 hour observation period. She feels 50% less depressed with Spravto and wants to continue it.   Continues meds Adderall XR 20 mg every morning, Wellbutrin XL 450 every morning, clonidine 0.1 mg twice daily, lorazepam 1 mg every 6 hours as needed. Has started nortriptyline 75 mg nightly for about 3 weeks and DT level 176, reduced to 50 mg HS early November. Still the same sx as noted last visit.  Tolerating meds.   Compliant.  Still depressed and family notices.  Has been able to participate in family interactions.  Some post-show let down and has to do detailed work which is hard for her bc ADD.  Sleep and eating well.  Energy OK but not great.  No SI.  Not cried in a year or so.  Clearly less depressed and hopeless than before the Spravato. Is not making  further progress generally.  Stuck with  moderate depression.  Is able to function pretty normally. Plan: trial pramipexole 0.25 mg BID off label for depression.   10/08/22 appt noted: Received Spravato 84 mg today as scheduled.  Tolerated it well without nausea or vomiting headache or chest pain or palpitations.  Expected dissociation gradually resolved over the 2 hour observation period. She feels 50% less depressed with Spravto and wants to continue it.   Continues meds Adderall XR 20 mg every morning, Wellbutrin XL 450 every morning, clonidine 0.1 mg twice daily, lorazepam 1 mg every 6 hours as needed. Has started nortriptyline 75 mg nightly for about 3 weeks and DT level 176, reduced to 50 mg HS early November. Still the same sx as noted last visit.  Tolerating meds.   Compliant.  Still depressed and family notices.  Has been able to participate in family interactions.  Some post-show let down and has to do detailed work which is hard for her bc ADD.  Sleep and eating well.  Energy OK but not great.  No SI.  Not cried in a year or so.  Clearly less depressed and hopeless than before the Spravato. Is not making further progress generally.  Stuck with moderate depression.  Behaved and felt pretty normally with family over for Thanksgiving. Doesn't see benefit or SE with pramipexole but thinks maybe it makes her worse. Plan:  increase pramipexole augmentation off label to 0.5 mg BID  10/15/2022 appointment noted: Received Spravato 84 mg today as scheduled.  Tolerated it well without nausea or vomiting headache or chest pain or palpitations.  Expected dissociation gradually resolved over the 2 hour observation period. She feels 50% less depressed with Spravto and wants to continue it.   Continues meds Adderall XR 20 mg every morning, Wellbutrin XL 450 every morning, clonidine 0.2 mg twice daily, lorazepam 1 mg every 6 hours as needed.  Trazodone 50 mg tablets 1-2 nightly as needed insomnia Has  started nortriptyline 75 mg nightly for about 3 weeks and DT level 176, reduced to 50 mg HS early November. Recommended increase pramipexole 0.5 mg twice daily off label for treatment resistant depression on 10/08/2022. She feels better motivated more active with pramipexole 0.5 mg twice daily.  She still is depressed but it is better.  We discussed the possibility of going up in the dose but did not change it.  10/22/2022 appointment noted: Received Spravato 84 mg today as scheduled.  Tolerated it well without nausea or vomiting headache or chest pain or palpitations.  Expected dissociation gradually resolved over the 2 hour observation period. She feels 50% less depressed with Spravto and wants to continue it.   Continues meds Adderall XR 20 mg every morning, Wellbutrin XL 450 every morning, clonidine 0.2 mg twice daily, lorazepam 1 mg every 8 hours as needed.  Trazodone 50 mg tablets 1-2 nightly as needed insomnia Has started nortriptyline 75 mg nightly for about 3 weeks and DT level 176, reduced to 50 mg HS early November. pramipexole 0.5 mg twice daily off label for treatment resistant depression on 10/08/2022. She is better motivated than she was.  She is journaling 3 pages a day.  She has started walking and has walked 5 days a week for 50 minutes for the last 2 weeks.  That is significantly helped her mood.  Her mood tends to be better when she interacts with family.  However she still has some periods of depression.  She is tolerating the medications well.  She still feels like her affect  and confidence is not back to normal. Plan:  increase pramipexole augmentation off label to 0.5 mg tID or 0.75 mg BID   10/29/22 appt noted: Received Spravato 84 mg today as scheduled.  Tolerated it well without nausea or vomiting headache or chest pain or palpitations.  Expected dissociation gradually resolved over the 2 hour observation period. She feels 50% less depressed with Spravto and wants to  continue it.   Continues meds Adderall XR 20 mg every morning, Wellbutrin XL 450 every morning, clonidine 0.2 mg twice daily, lorazepam 1 mg every 8 hours as needed.  Trazodone 50 mg tablets 1-2 nightly as needed insomnia Has started nortriptyline 75 mg nightly for about 3 weeks and DT level 176, reduced to 50 mg HS early November. pramipexole increased to 0.75 mg twice daily off label for treatment resistant depression  She has been feeling somewhat better with the increase in pramipexole and is tolerating it well.  She still is easily overwhelmed.  Her affect and mood can improve now when around her family or doing something positive.  She has been able to be more productive. She is tolerating the current medications. She is still considering TMS as an alternative to Spravato.  11/15/2022 appointment noted: Received Spravato 84 mg today as scheduled.  Tolerated it well without nausea or vomiting headache or chest pain or palpitations.  Expected dissociation gradually resolved over the 2 hour observation period. She feels 50% less depressed with Spravto and wants to continue it.   Continues meds Adderall XR 20 mg every morning, Wellbutrin XL 450 every morning, clonidine 0.2 mg twice daily, lorazepam 1 mg every 8 hours as needed.  Trazodone 50 mg tablets 1-2 nightly as needed insomnia Has started nortriptyline 75 mg nightly for about 3 weeks and DT level 176, reduced to 50 mg HS early November. pramipexole increased to 0.75 mg twice daily off label for treatment resistant depression  He has noticed some increase in depression due to the length of time since the last Spravato administration.  She believes Spravato is helping her.  sHe is not suicidal but has felt very blue the last few days. She is tolerating the medication. She is ambivalent about Spravato versus TMS but she is considering TMS. She wants to continue Spravato administration because it is clearly helpful.  11/19/22 appt noted: Received  Spravato 84 mg today as scheduled.  Tolerated it well without nausea or vomiting headache or chest pain or palpitations.  Expected dissociation gradually resolved over the 2 hour observation period. She feels 50% less depressed with Spravto and wants to continue it.   Continues meds Adderall XR 20 mg every morning, Wellbutrin XL 450 every morning, clonidine 0.2 mg twice daily, lorazepam 1 mg every 8 hours as needed.  Trazodone 50 mg tablets 1-2 nightly as needed insomnia Has started nortriptyline 75 mg nightly for about 3 weeks and DT level 176, reduced to 50 mg HS early November. pramipexole increased to 0.75 mg twice daily off label for treatment resistant depression  She has felt better back on Spravato more regularly.  However she still has residual depression esp when alone or when wihtout activity.  Can function when needed.   Does not have her connfidence back. She plans to pursue TMS availability. Have discussed retrying Auvelity  11/28/22 appt noted: Received Spravato 84 mg today as scheduled.  Tolerated it well without nausea or vomiting headache or chest pain or palpitations.  Expected dissociation gradually resolved over the 2 hour observation period.  She feels 50% less depressed with Spravto and wants to continue it.   Continues meds Adderall XR 20 mg every morning, Wellbutrin XL 450 every morning, clonidine 0.2 mg twice daily, lorazepam 1 mg every 8 hours as needed.  Trazodone 50 mg tablets 1-2 nightly as needed insomnia Has started nortriptyline 75 mg nightly for about 3 weeks and DT level 176, reduced to 50 mg HS early November. pramipexole increased to 0.75 mg twice daily off label for treatment resistant depression  Last week she was more depressed than usual for reasons that are not clear.  She has not had a clear answer from Filomena Jungling about [employer] options.  States that they have not returned her call.  She is asked about Auvelity retry in place of Wellbutrin.  Has still been  walking.  12/03/22 appt noted: Received Spravato 84 mg today as scheduled.  Tolerated it well without nausea or vomiting headache or chest pain or palpitations.  Expected dissociation gradually resolved over the 2 hour observation period. She feels 50% less depressed with Spravto and wants to continue it.   Continues meds Adderall XR 20 mg every morning, Wellbutrin XL 450 every morning, clonidine 0.2 mg twice daily, lorazepam 1 mg every 8 hours as needed.  Trazodone 50 mg tablets 1-2 nightly as needed insomnia started nortriptyline 75 mg nightly for about 3 weeks and DT level 176, reduced to 50 mg HS early November. pramipexole increased to 0.75 mg twice daily off label for treatment resistant depression  No SE .  Satisfied with meds. Depression is better in the last week.  Not sure why that is the case.  Still walking daily and that helps and journaling 3 pages daily.  Working on Biomedical scientist.  No changes desire.  Clearly benefits from Spravato but not 100%.  Still considering TMS.  12/10/22 appt noted: Received Spravato 84 mg today as scheduled.  Tolerated it well without nausea or vomiting headache or chest pain or palpitations.  Expected dissociation gradually resolved over the 2 hour observation period. She feels 50% less depressed with Spravto and wants to continue it.   Continues meds Adderall XR 20 mg every morning, Wellbutrin XL 450 every morning, clonidine 0.2 mg twice daily, lorazepam 1 mg every 8 hours as needed.  Trazodone 50 mg tablets 1-2 nightly as needed insomnia started nortriptyline 75 mg nightly for about 3 weeks and DT level 176, reduced to 50 mg HS early November. pramipexole increased to 0.75 mg twice daily off label for treatment resistant depression  No SE .   Depression was worse this week for no apparent reason.  She struggled being positive.  She has felt more discouraged.  She has felt more anxious.  She has had a hard time doing tasks.  She is interested in retrying the  [place] which we had discussed previously. Plan: She agrees to [employer].  To improve tolerability and reduce risk of side effects, Stop Wellbutrin and start Auvelity 1 in the morning for 1 week then 1 twice daily AndReduce pramipexole 0.5 mg BID and reduce nortriptyline to 50 mg HS  12/17/22 appt noted: Received Spravato 84 mg today as scheduled.  Tolerated it well without nausea or vomiting headache or chest pain or palpitations.  Expected dissociation gradually resolved over the 2 hour observation period. She feels 50% less depressed with Spravto and wants to continue it.   Continues meds Adderall XR 20 mg every morning, stopped Wellbutrin XL 150 every morning, clonidine 0.2 mg twice daily,  lorazepam 1 mg every 8 hours as needed.  Trazodone 50 mg tablets 1-2 nightly as needed insomnia Has started nortriptyline 75 mg nightly for about 3 weeks and DT level 176, reduced to 50 mg HS early November. pramipexole 0.375 mg twice daily off label for treatment resistant depression with plan to stop Started Auvelity 1 AM Still depressed to moderate degree.  Tolerating Auvelity so far.  No other problems with meds.  Willing to give Auvelity a chance. Plan: She agrees to [employer].  Continue 1 AM and when tolerated then increase to BID Stop pramipexole.  12/26/22 received Spravato  01/01/23 appt noted: Received Spravato 84 mg today as scheduled.  Tolerated it well without nausea or vomiting headache or chest pain or palpitations.  Expected dissociation gradually resolved over the 2 hour observation period. She feels 50% less depressed with Spravto and wants to continue it.   Continues meds Adderall XR 20 mg every morning, stopped Wellbutrin XL 150 every morning, clonidine 0.2 mg twice daily, lorazepam 1 mg every 8 hours as needed.  Trazodone 50 mg tablets 1-2 nightly as needed insomnia Has started nortriptyline 75 mg nightly for about 3 weeks and DT level 176, reduced to 50 mg HS early  November. Forgot to reduce pramipexole stoll taking  0.5 mg twice daily off label for treatment resistant depression  Started Auvelity 1 AM & PM last week. Occ misses Spravato DT htn.this week a better than last.   Painting again more and it helps mood.  Confidence still low and not as likely to socialize as normal but enjoys family.   Still ambivalent about TMS. Tolerating meds.  Including the increase in [place].    01/08/23 appt noted: Received Spravato 84 mg today as scheduled.  Tolerated it well without nausea or vomiting headache or chest pain or palpitations.  Expected dissociation gradually resolved over the 2 hour observation period. She feels 50% less depressed with Spravto and wants to continue it.   Continues meds Adderall XR 20 mg every morning, stopped Wellbutrin XL 150 every morning, clonidine 0.2 mg twice daily, lorazepam 1 mg every 8 hours as needed.  Trazodone 50 mg tablets 1-2 nightly as needed insomnia Has started nortriptyline 75 mg nightly for about 3 weeks and DT level 176, reduced to 50 mg HS early November. reduced pramipexole to 0.5 mg daily off label for treatment resistant depression  Started Auvelity 1 AM & PM mid Feb. More depressed as week progresses.  Thinks it is worse with less pramipexole.  More negative.  Able to function but feels miserable.  No SI.  Hopeless.  Sleep ok.  Tolerating meds.   [facility] and appt mid March. Plan: increase pramipexole to 0.5 mg TID for dep bc seemed to worsen with the reduction.  01/15/23 appt noted: Received Spravato 84 mg today as scheduled.  Tolerated it well without nausea or vomiting headache or chest pain or palpitations.  Expected dissociation gradually resolved over the 2 hour observation period. She feels 50% less depressed with Spravto and wants to continue it.   Continues meds Adderall XR 20 mg every morning, stopped Wellbutrin XL 150 every morning, clonidine 0.2 mg twice daily, lorazepam 1 mg every 8  hours as needed.  Trazodone 50 mg tablets 1-2 nightly as needed insomnia Has started nortriptyline 75 mg nightly for about 3 weeks and DT level 176, reduced to 50 mg HS early November. Increased pramipexole to 0.5 mg  to TID  off label for  treatment resistant depression  Started Auvelity 1 AM & PM mid Feb. markedly better depression the increase in pramipexole.  She feels like her depression is almost resolved.  She is very pleased with the response.  She is tolerating the medications well  01/30/23 appt noted: Received Spravato 84 mg today as scheduled.  Tolerated it well without nausea or vomiting headache or chest pain or palpitations.  Expected dissociation gradually resolved over the 2 hour observation period. She feels 50% less depressed with Spravto and wants to continue it.   Continues meds Adderall XR 20 mg every morning, stopped Wellbutrin XL 150 every morning, clonidine 0.2 mg twice daily, lorazepam 1 mg every 8 hours as needed.  Trazodone 50 mg tablets 1-2 nightly as needed insomnia Has started nortriptyline 75 mg nightly for about 3 weeks and DT level 176, reduced to 50 mg HS early November. Increased pramipexole to 0.5 mg  to TID  off label for treatment resistant depression  Started Auvelity 1 AM & PM mid Feb. Mood markedly better with pramipexole added.  Nearly normal mood now with minimal depression.  More social and outgoing and motivated and resolved anhedonia. No SE.  Compliant.  02/04/23 appt noted: Continues meds Adderall XR 20 mg every morning, stopped Wellbutrin XL 150 every morning, clonidine 0.2 mg twice daily, lorazepam 1 mg every 8 hours as needed.  Trazodone 50 mg tablets 1-2 nightly as needed insomnia Has started nortriptyline 75 mg nightly for about 3 weeks and DT level 176, reduced to 50 mg HS early November. Increased pramipexole to 0.5 mg  to TID  off label for treatment resistant depression  Started Auvelity 1 AM & PM mid Feb. Received Spravato 84 mg today as  scheduled.  Tolerated it well without nausea or vomiting headache or chest pain or palpitations.  Expected dissociation gradually resolved over the 2 hour observation period. She feels 50% less depressed with Spravto and wants to continue it.  The pramipexole got her the rest of the way better.  Doesn't want to cut back on any tx bc afraid of relapse. Tolerating meds.  02/15/23 appt noted: Continues meds Adderall XR 20 mg every morning, stopped Wellbutrin XL 150 every morning, clonidine 0.2 mg twice daily, lorazepam 1 mg every 8 hours as needed.  Trazodone 50 mg tablets 1-2 nightly as needed insomnia Has started nortriptyline 75 mg nightly for about 3 weeks and DT level 176, reduced to 50 mg HS early November. Increased pramipexole to 0.5 mg  to TID  off label for treatment resistant depression  Started Auvelity 1 AM & PM mid Feb. Received Spravato 84 mg today as scheduled.  Tolerated it well without nausea or vomiting headache or chest pain or palpitations.  Expected dissociation gradually resolved over the 2 hour observation period. She feels no longer depressed.   The pramipexole got her the rest of the way better.  Doesn't want to cut back on any tx bc afraid of relapse. Tolerating meds.  02/18/23 appt noted: Continues meds Adderall XR 20 mg every morning, stopped Wellbutrin XL 150 every morning, clonidine 0.2 mg twice daily, lorazepam 1 mg every 8 hours as needed.  Trazodone 50 mg tablets 1-2 nightly as needed insomnia Has started nortriptyline 75 mg nightly for about 3 weeks and DT level 176, reduced to 50 mg HS early November. Increased pramipexole to 0.5 mg  to TID  off label for treatment resistant depression  Started Auvelity 1 AM & PM mid Feb. Received Spravato 84 mg  today as scheduled.  Tolerated it well without nausea or vomiting headache or chest pain or palpitations.  Expected dissociation gradually resolved over the 2 hour observation period. She feels no longer depressed except when  ran out of Auvelity this week DT lack of availability.  Much worse without it..   The pramipexole got her the rest of the way better.  Doesn't want to cut back on any tx bc afraid of relapse. Tolerating meds.  02/25/23 appt: Received Spravato 84 mg today as scheduled.  Tolerated it well without nausea or vomiting headache or chest pain or palpitations.  Expected dissociation gradually resolved over the 2 hour observation period. Continues meds Adderall XR 20 mg every morning, stopped Wellbutrin XL 150 every morning, clonidine 0.2 mg twice daily, lorazepam 1 mg every 8 hours as needed.  Trazodone 50 mg tablets 1-2 nightly as needed insomnia Has started nortriptyline 75 mg nightly for about 3 weeks and DT level 176, reduced to 50 mg HS early November. Increased pramipexole to 0.5 mg  to TID  off label for treatment resistant depression  Started Auvelity 1 AM & PM mid Feb. No SE Still dramatically better with meds and Spravato.  Not 100% over depression. Had GS born last week and able to enjoy it now.  Is a little sleepy with pramipexole but manageable.  No change desired.  Sleep is ok.  03/04/23 appt noted: Received Spravato 84 mg today as scheduled.  Tolerated it well without nausea or vomiting headache or chest pain or palpitations.  Expected dissociation gradually resolved over the 2 hour observation period. Continues meds Adderall XR 20 mg every morning, stopped Wellbutrin XL 150 every morning, clonidine 0.2 mg twice daily, lorazepam 1 mg every 8 hours as needed.  Trazodone 50 mg tablets 1-2 nightly as needed insomnia nortriptyline reduced to 50 mg HS early November. Increased pramipexole to 0.5 mg  to TID  off label for treatment resistant depression  Started Auvelity 1 AM & PM mid Feb. Still generally doing well with meds and Spravato except when got tooth abscess.  Freels more depressed after dental surgery.   No SE with meds.   Sleep is OK.  No med changes desired  03/11/23 appt  noted: Received Spravato 84 mg today as scheduled.  Tolerated it well without nausea or vomiting headache or chest pain or palpitations.  Expected dissociation gradually resolved over the 2 hour observation period. Continues meds Adderall XR 20 mg every morning, stopped Wellbutrin XL 150 every morning, clonidine 0.2 mg twice daily, lorazepam 1 mg every 8 hours as needed.  Trazodone 50 mg tablets 1-2 nightly as needed insomnia nortriptyline reduced to 50 mg HS early November. Increased pramipexole to 0.5 mg  to TID  off label for treatment resistant depression  Started Auvelity 1 AM & PM mid Feb. Some increase depression with infection and needing amoxiciliin this week.  Otherwie still doing well with mood and meds. No med changes desired or indicated. No SE  03/21/2023 appointment noted: Continues meds Adderall XR 20 mg every morning, stopped Wellbutrin XL 150 every morning, clonidine 0.2 mg twice daily, lorazepam 1 mg every 8 hours as needed.  Trazodone 50 mg tablets 1-2 nightly as needed insomnia nortriptyline reduced to 50 mg HS early November. Increased pramipexole to 0.5 mg  to TID  off label for treatment resistant depression  Started Auvelity 1 AM & PM mid Feb. Received Spravato 84 mg today as scheduled.  Tolerated it well without nausea or vomiting headache  or chest pain or palpitations.  Expected dissociation gradually resolved over the 2 hour observation period. Depression is much better.  Pretty much resolved this week.  She feels the Auvelity and pramipexole have made the biggest difference.  Obviously the Spravato is also helping significantly.  She wants to continue all of these medications.  She still has anxiety easily.  Especially facing something that she would rather avoid.  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 Tolerating meds without significant side effects. Plan no med changes  03/29/23 appt  noted: Continues meds Adderall XR 20 mg every morning, stopped Wellbutrin XL 150 every morning, clonidine 0.2 mg twice daily, lorazepam 1 mg every 8 hours as needed.  Trazodone 50 mg tablets 1-2 nightly as needed insomnia, nortriptyline 50 mg HS,  pramipexole 0.5 mg  to TID  off label for treatment resistant depression  Started Auvelity 1 AM & PM mid Feb 2024 No SE Received Spravato 84 mg today as scheduled.  Tolerated it well without nausea or vomiting headache or chest pain or palpitations.  Expected dissociation gradually resolved over the 2 hour observation period. Depression continues to improve to a point of very mild sx.  Still hasn't gotten her confidence fully back.  She does enjoy social interactions and is productive at work. She is tolerating the meds well without side effects.  We discussed the possibility of reducing some of the medication given the marked response which was positive with pramipexole.  04/04/23 appt noted: Continues meds Adderall XR 20 mg every morning, stopped Wellbutrin XL 150 every morning, clonidine 0.2 mg twice daily, lorazepam 1 mg every 8 hours as needed.  Trazodone 50 mg tablets 1-2 nightly as needed insomnia, nortriptyline 50 mg HS,  pramipexole 0.5 mg  to TID  off label for treatment resistant depression  Started Auvelity 1 AM & PM mid Feb 2024 No SE Received Spravato 84 mg today as scheduled.  Tolerated it well without nausea or vomiting headache or chest pain or palpitations.  Expected dissociation gradually resolved over the 2 hour observation period. Dep is still much improved with increase in pramipexole.  Largely resolved.  Still gets anxious but takes less lorazepam prn but still needs it.  Sleep ok.  Tolerating meds.  04/10/23 appt noted: Continues meds Adderall XR 20 mg every morning, clonidine 0.2 mg twice daily, lorazepam 1 mg every 8 hours as needed.  Trazodone 50 mg tablets 1-2 nightly as needed insomnia, nortriptyline 50 mg HS,  pramipexole 0.5 mg   to TID  off label for treatment resistant depression  Started Auvelity 1 AM & PM mid Feb 2024 No SE.  Satisfied with meds.   Received Spravato 84 mg today as scheduled.  Tolerated it well without nausea or vomiting headache or chest pain or palpitations.  Expected dissociation gradually resolved over the 2 hour observation period. Dep is still much improved with increase in pramipexole.  Largely resolved.  Still gets anxious but takes less lorazepam prn but still needs it.  Sleep ok.  Tolerating meds. Did an art show in another town; I couldn't have done this a year ago.    05/07/23 appt noted: Continues meds Adderall XR 20 mg every morning, clonidine 0.2 mg twice daily, lorazepam 1 mg every 8 hours as needed.  Trazodone 50 mg tablets 1-2 nightly as needed insomnia, nortriptyline 25-50 mg HS,  pramipexole 0.5 mg  to TID  off label for treatment resistant depression  Started Auvelity 1 AM & PM mid Feb 2024 No SE.  Satisfied with meds.   Received Spravato 84 mg today as scheduled.  Tolerated it well without nausea or vomiting headache or chest pain or palpitations.  Expected dissociation gradually resolved over the 2 hour observation period. H says she is her old self.  Dep so much better.  Painting going well.  Anxiety in AM and then better.  Wakes with it.  Structured days more.  Negative self talk gone.  Wonderful to have relief.  Good weekend at a wedding.  Enjoys GS; [place].  More outspoken since the dep but not over the top.   More interest overall and satisfied wth resp.  Agrees to wean nortriptyline but doesn't want other med changes.  05/15/23 received Spravato 84  05/27/23 appt noted: Continues meds Adderall XR 20 mg every morning, clonidine 0.2 mg twice daily, lorazepam 1 mg every 8 hours as needed.  Trazodone 50 mg tablets 1-2 nightly as needed insomnia, pramipexole 0.5 mg  to TID  off label for treatment resistant depression  Started Auvelity 1 AM & PM mid Feb 2024 No SE.  Satisfied with  meds.   Received Spravato 84 mg today as scheduled.  Tolerated it well without nausea or vomiting headache or chest pain or palpitations.  Expected dissociation gradually resolved over the 2 hour observation period. Maybe a little more anxious re: weaning off nortriptyline.  Busy summer.  Extremely prolific painting.   Has a pending show and stressed over preparing.   Enjoys [employer].  Kept him yesterday for a couple of hours.   Dep has been like a cloak with physical sensation for a long time but is much better than it was.  Still not 100% all the time.  Struggles with some chronic guilt issues.  Self talk and CBT on herself yesterday had some. Still some fear about travelling and demands but so much better.   Making people laugh again and socially engaged and H agrees so much better. M has been impossible.  WC and immobile.  She wastes money.   Plan no med changes  06/03/23 appt noted: Meds as above.  No SE.  Satisfied with meds.   Received Spravato 84 mg today as scheduled.  Tolerated it well without nausea or vomiting headache or chest pain or palpitations.  Expected dissociation gradually resolved over the 2 hour observation period. Overall she feels better with Spravato.  She is minimally depressed generally when she is alone and not active but she is staying active socially and with her painting.  This is really helped with her depression and she is having more success.  She still has residual anxiety which she feels she needs to take lorazepam during the day.  She is attempting to minimize it but without much success.  She wants to continue the Spravato because of the dramatic improvement she is seen with this and the medications.  06/11/23 appt noted: Meds as above.  No SE.  Satisfied with meds.   Received Spravato 84 mg today as scheduled.  Tolerated it well without nausea or vomiting headache or chest pain or palpitations.  Expected dissociation gradually resolved over the 2 hour  observation period. Still has some anxiety wihtout reason an benefit lorazepam and usually only 1 mg daily.  Makes her sleepy if takes it in the day. Sees sig benefit with pramipexole 0.5 mg TID and consistent with it. Mood is still  good overall.  But bc mother still can worry about getting older.  Feels too scared about it but working on it. No concerns with meds.    06/20/23 appt noted:  Continues meds Adderall XR 20 mg every morning, clonidine 0.2 mg twice daily, lorazepam 1 mg every 8 hours as needed.  Trazodone 50 mg tablets 1-2 nightly as needed insomnia, pramipexole 0.5 mg  to TID  off label for treatment resistant depression  Started Auvelity 1 AM & PM mid Feb 2024 No SE.  Satisfied with meds.  Needs each as written. Received Spravato 84 mg today as scheduled.  Tolerated it well without nausea or vomiting headache or chest pain or palpitations.  Expected dissociation gradually resolved over the 2 hour observation period. Marked benefit Spravato and meds remain.  No manic sx.  Residual anxiety is gradually improving.  Sleep is ok. Some residual anxiey at time.  Enjoys GS and family and functioning fine. Plan no med changes  06/25/23 appt noted: Continues meds Adderall XR 20 mg every morning, clonidine 0.2 mg twice daily, lorazepam 1 mg every 8 hours as needed.  Trazodone 50 mg tablets 1-2 nightly as needed insomnia, pramipexole 0.5 mg  to TID  off label for treatment resistant depression  Started Auvelity 1 AM & PM mid Feb 2024 No SE.  Satisfied with meds.  Needs each as written. Received Spravato 84 mg today as scheduled.  Tolerated it well without nausea or vomiting headache or chest pain or palpitations.  Expected dissociation gradually resolved over the 2 hour observation period. Marked benefit Spravato and meds remain.  Minimal depression at this time. No manic sx.  Residual anxiety is gradually improving and she is using much less lorazepam.  Sleep is ok. Some residual anxiey at time.   Enjoys GS and family and functioning fine. Plan no med changes  07/04/23 appt noted: Continues meds Adderall XR 20 mg every morning, clonidine 0.2 mg twice daily, lorazepam 1 mg every 8 hours as needed.  Trazodone 50 mg tablets 1-2 nightly as needed insomnia, pramipexole 0.5 mg  to TID  off label for treatment resistant depression  Started Auvelity 1 AM & PM mid Feb 2024 No SE.  Satisfied with meds.  Needs each as written. Received Spravato 84 mg today as scheduled.  Tolerated it well without nausea or vomiting headache or chest pain or palpitations.  Expected dissociation gradually resolved over the 2 hour observation period.  Good experience today. Dealing with stress Of having to place her into a nursing facility.  However she has handled it well.  Patient has been able to take over responsibilities in the family that she could not have done prior to [facility] and current meds.  Her depression is in remission.  She she is not highly anxious usually but still needs lorazepam in the evening.  No problems or concerns with meds.  ADD is well managed with the Adderall.  She is productive and enjoying life including working family.  07/11/23 appt noted: Continues meds Adderall XR 20 mg every morning, clonidine 0.2 mg twice daily, lorazepam 1 mg every 8 hours as needed.  Trazodone 50 mg tablets 1-2 nightly as needed insomnia, pramipexole 0.5 mg  to TID  off label for treatment resistant depression  Started Auvelity 1 AM & PM mid Feb 2024 No SE.  Satisfied with meds.  Needs each as written. Received Spravato 84 mg today as scheduled.  Tolerated it well without nausea or vomiting headache or chest pain  or palpitations.  Expected dissociation gradually resolved over the 2 hour observation period.  Good experience today. Depression remained under control with remission.  She is able to enjoy things normally that she could not enjoy last year.  She is able to handle stressors that she could not handle last year  such as dealing with her mother's declining health and the family issues that that brings up.  That has created more anxiety and she needs the lorazepam lately.  She had been avoiding it for the most part until these issues.  No changes desired.  09/02/23 appt noted:  no Spravato Misses Auvelity if doesn't take it but $450/month.  Will look into cost solutions.  On MCR now. Kathlene November Retired.  Helping with grandchild. Done art shows out of town.   Couldn't have done it a year ago. Chronic disorganization.   H retired.  Big lifestyle change.  H hanging out with .  10/24/23 appt noted: Continues meds Adderall XR 20 mg every morning, clonidine 0.2 mg twice daily, lorazepam 1 mg every 8 hours as needed, not used lately.  Trazodone 50 mg tablets 1-2 nightly as needed insomnia, pramipexole 0.5 mg  to TID  off label for treatment resistant depression  Started Auvelity 1 AM & PM mid Feb 2024.  Not taking bc cost with insurance >$400 Overall doing pretty well.  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  Also dealing with high maintenance mother in asst living.  Sister single and hard to deal with somewhat.   Still doing well overall with depression.  Anxiey is ok.   Benefit with ADD med.   Dep is still under control.  Seems very removed from her now.  Function is very good.   Enjoys GS 7 mos No issues with meds.    ECT-MADRS    Flowsheet Row Clinical Support from 07/11/2023 in [facility] Clinical Support from 01/08/2023 in [facility] Clinical Support from 08/06/2022 in [facility] Clinical Support from 07/04/2022 in [facility] Clinical Support from 05/21/2022 in [facility]  MADRS Total Score 9 21 29 15 27       Past Psychiatric Medication Trials: fluoxetine, duloxetine, Viibryd, Pristiq, sertraline, citalopram,  Trintellix anxious and SI Wellbutrin XL  450 Auvelity 1 dose nortriptyline 75 mg nightly for about 3 weeks and DT level 176, reduced to 50 mg HS NR. Pramipexole 1 mg NR Adderall, Adderall XR, Vyvanse, Ritalin, Strattera low dose NR Lorazepam Trazodone  Depakote,  lamotrigine cog complaints Lithium remotely Abilify 7.5  Vraylar 1.5 mg daily agitation and insomnia Rexulti insomnia Latuda 40 one dose, CO anxious and SI Seroquel XR 300 Olanzapine 10  At visit November 12, 2019. We discussed Patient developed an increasingly severe alcohol dependence problem since her last visit in January.  She went to [facility] and has had no alcohol abuse since .  She never abused stimulants but they took her off the stimulants at [facility].  Her ADD was markedly worse.  The Wellbutrin did not help the ADD.   D history lamotrigine rash at 67 yo  Review of Systems:  Review of Systems  Musculoskeletal:  Positive for back pain. Negative for arthralgias.       SP hip surgery October 2020  Neurological:  Negative for tremors.  Psychiatric/Behavioral:  Negative for agitation, behavioral problems, confusion, decreased concentration, dysphoric mood, hallucinations, self-injury, sleep disturbance and suicidal ideas. The patient is  nervous/anxious. The patient is not hyperactive.        Forgetful at times about med recommendations but better now    Medications: I have reviewed the patient's current medications.  Current Outpatient Medications  Medication Sig Dispense Refill   amLODipine (NORVASC) 2.5 MG tablet Take 2.5 mg by mouth daily.     cloNIDine (CATAPRES) 0.2 MG tablet TAKE 1 TABLET BY MOUTH TWICE A DAY 60 tablet 2   iron polysaccharides (NIFEREX) 150 MG capsule TAKE 1 CAPSULE BY MOUTH EVERY DAY 90 capsule 1   losartan (COZAAR) 50 MG tablet Take 50 mg by mouth daily.     nebivolol (BYSTOLIC) 2.5 MG tablet Take 2.5 mg by mouth daily.     traZODone (DESYREL) 50 MG tablet TAKE 1-2 TABLETS BY MOUTH NIGHTLY AS NEEDED FOR SLEEP 180  tablet 1   amphetamine-dextroamphetamine (ADDERALL XR) 20 MG 24 hr capsule Take 1 capsule (20 mg total) by mouth every morning. 30 capsule 0   [START ON 11/21/2023] amphetamine-dextroamphetamine (ADDERALL XR) 20 MG 24 hr capsule Take 1 capsule (20 mg total) by mouth every morning. 30 capsule 0   buPROPion ER (WELLBUTRIN SR) 100 MG 12 hr tablet Take 1 tablet (100 mg total) by mouth 2 (two) times daily. 180 tablet 0   LORazepam (ATIVAN) 1 MG tablet Take 1 tablet (1 mg total) by mouth every 8 (eight) hours as needed. for anxiety 45 tablet 1   pramipexole (MIRAPEX) 0.5 MG tablet Take 1 tablet (0.5 mg total) by mouth 3 (three) times daily. 270 tablet 1   No current facility-administered medications for this visit.    Medication Side Effects: None  Allergies:  Allergies  Allergen Reactions   Metronidazole Shortness Of Breath and Other (See Comments)    Heart pounding   Ferrlecit [Na Ferric Gluc Cplx In Sucrose] Other (See Comments)    Infusion reaction 05/12/2019    Past Medical History:  Diagnosis Date   ADHD    Anemia    Anxiety    Arthritis    Depression    Heart murmur    i went to see a cardiologit slast eyar  and i had zero plaque,    PONV (postoperative nausea and vomiting)    Recovering alcoholic in remission (HCC)     Family History  Problem Relation Age of Onset   Atrial fibrillation Mother    CAD Father     Past Medical History, Surgical history, Social history, and Family history were reviewed and updated as appropriate.   Please see review of systems for further details on the patient's review from today.   Objective:   Physical Exam:  There were no vitals taken for this visit.  Physical Exam Constitutional:      General: She is not in acute distress. Neurological:     Mental Status: She is alert and oriented to person, place, and time.     Coordination: Coordination normal.     Gait: Gait normal.  Psychiatric:        Attention and Perception: Attention  and perception normal.        Mood and Affect: Mood is not anxious or depressed. Affect is not tearful.        Speech: Speech is not rapid and pressured.        Behavior: Behavior is not hyperactive.        Thought Content: Thought content is not paranoid or delusional. Thought content does not include homicidal or suicidal ideation. Thought content  does not include suicidal plan.        Cognition and Memory: Cognition normal. Memory is not impaired.        Judgment: Judgment normal.     Comments: Insight intact. No auditory or visual hallucinations. No delusions.  No depression at this time and very positive affect and expressive normally for her. No manic sx     Lab Review:     Component Value Date/Time   NA 137 01/12/2021 1430   NA 140 11/18/2018 1544   K 3.8 01/12/2021 1430   CL 108 01/12/2021 1430   CO2 22 01/12/2021 1430   GLUCOSE 94 01/12/2021 1430   BUN 14 01/12/2021 1430   BUN 20 11/18/2018 1544   CREATININE 0.82 01/12/2021 1430   CALCIUM 8.9 01/12/2021 1430   PROT 6.6 01/12/2021 1430   ALBUMIN 3.9 01/12/2021 1430   AST 12 (L) 01/12/2021 1430   ALT 11 01/12/2021 1430   ALKPHOS 46 01/12/2021 1430   BILITOT 0.5 01/12/2021 1430   GFRNONAA >60 01/12/2021 1430   GFRAA >60 09/02/2019 0249   GFRAA >60 01/27/2019 0811       Component Value Date/Time   WBC 4.5 01/12/2021 1430   RBC 4.32 01/12/2021 1430   HGB 12.8 01/12/2021 1430   HGB 12.9 07/17/2019 0953   HCT 38.5 01/12/2021 1430   HCT 21.9 (L) 12/25/2018 1221   PLT 272 01/12/2021 1430   PLT 286 07/17/2019 0953   MCV 89.1 01/12/2021 1430   MCH 29.6 01/12/2021 1430   MCHC 33.2 01/12/2021 1430   RDW 12.4 01/12/2021 1430   LYMPHSABS 1.4 01/12/2021 1430   MONOABS 0.4 01/12/2021 1430   EOSABS 0.0 01/12/2021 1430   BASOSABS 0.0 01/12/2021 1430    No results found for: "POCLITH", "LITHIUM"   No results found for: "PHENYTOIN", "PHENOBARB", "VALPROATE", "CBMZ"   .res Assessment: Plan:    Recurrent major  depression resistant to treatment (HCC) - Plan: pramipexole (MIRAPEX) 0.5 MG tablet, buPROPion ER (WELLBUTRIN SR) 100 MG 12 hr tablet  Attention deficit hyperactivity disorder (ADHD), predominantly inattentive type - Plan: amphetamine-dextroamphetamine (ADDERALL XR) 20 MG 24 hr capsule, amphetamine-dextroamphetamine (ADDERALL XR) 20 MG 24 hr capsule  Generalized anxiety disorder - Plan: LORazepam (ATIVAN) 1 MG tablet  Insomnia due to mental condition - Plan: LORazepam (ATIVAN) 1 MG tablet  Alcohol use disorder, moderate, in sustained remission, dependence (HCC)   She has treatment resistant major depression  with 100% better with Spravato and meds. She has remained in remission after stopping Spravato and increasing pramipexole 0.5 mg 3 times daily.  She had also stopped Auvelity because insurance would not pay for it.  We discussed the risk of relapse but quite apparently her depression is in remission due to the pramipexole.  She is not taking any unusual dose of Wellbutrin and Wellbutrin failed to pull her out of the depression before so it is clearly the pramipexole is making a difference.  Depression resolved recently after increasing pramipexole 0.5 mg TID, brief relapse when out of Auvelity Consider switch to ER to reduce sleepiness if needed but better.   has failed all major categories of antidepressants MAO inhibitors which have not been tried.  Worse with less pramipexole. Increased to 0.5 mg TID on about end of Feb 2024 and markedly better Dosing range off label for depression ranges from 1-5 mg daily.  Disc risk compulsive impulsive behavior and mania.    Started Spravato 84 mg twice weekly on 03/16/2022.  Stopped bc MCR  cost is too high.  So far is ok but not perfect.  Confidence is  back now.  Adderall  XR 20 mg AM  Has not taken much Ativan in the last few months.  Using it rarely now.  Option weaning clonidine but she needs to watch her blood pressure because it has been  high in the past.  Otherwise continue clonidine 0.2 mg BID off label for anxiety and helps BP partially. BP is better controlled and more consistent.Rec keep track of BP and discuss with PCP. It has been up and down.  Sometimes needs extra clonidine before Spravato.  Better lately.  Discussed potential benefits, risks, and side effects of stimulants with patient to include increased heart rate, palpitations, insomnia, increased anxiety, increased irritability, or decreased appetite.  Instructed patient to contact office if experiencing any significant tolerability issues.  She continues walkly daily with benefit. And journaling.   Has Maintained sobriety.    Continue Adderall XR 20 mg every morning Call if depression recurs off of the Auvelity and need to investigate ways to get the active ingredient via generics.  continue clonidine 0.2 mg twice daily for anxiety and blood pressure. Continue lorazepam 1 mg 3 times daily s as needed but only uses rarely now. Continue pramipexole 0.5 mg 3 times daily off label for dep; effective but disc potential increase. Continue trazodone 50 mg tablets 1-2 nightly as needed for sleep.  FU 8weeks  Meredith Staggers, MD, DFAPA     Please see After Visit Summary for patient specific instructions.  No future appointments.                        No orders of the defined types were placed in this encounter.      -------------------------------

## 2023-11-28 ENCOUNTER — Other Ambulatory Visit: Payer: Self-pay | Admitting: Psychiatry

## 2023-11-28 DIAGNOSIS — F411 Generalized anxiety disorder: Secondary | ICD-10-CM

## 2023-11-28 DIAGNOSIS — I10 Essential (primary) hypertension: Secondary | ICD-10-CM

## 2023-12-03 ENCOUNTER — Telehealth: Payer: Self-pay | Admitting: Psychiatry

## 2023-12-03 ENCOUNTER — Other Ambulatory Visit: Payer: Self-pay

## 2023-12-03 DIAGNOSIS — F411 Generalized anxiety disorder: Secondary | ICD-10-CM

## 2023-12-03 DIAGNOSIS — F5105 Insomnia due to other mental disorder: Secondary | ICD-10-CM

## 2023-12-03 DIAGNOSIS — F900 Attention-deficit hyperactivity disorder, predominantly inattentive type: Secondary | ICD-10-CM

## 2023-12-03 MED ORDER — LORAZEPAM 1 MG PO TABS: 1.0000 mg | ORAL_TABLET | Freq: Three times a day (TID) | ORAL | 0 refills | Status: DC | PRN

## 2023-12-03 MED ORDER — AMPHETAMINE-DEXTROAMPHET ER 20 MG PO CP24: 20.0000 mg | ORAL_CAPSULE | ORAL | 0 refills | Status: DC

## 2023-12-03 NOTE — Telephone Encounter (Signed)
Pended adderall xr 20 and lorazepam 1 mg to [facility].

## 2023-12-03 NOTE — Telephone Encounter (Signed)
Next appt is 12/31/23. Requesting refills on Adderall and Lorazepam called to:  [id number] - [place], [place] - [address] AT [facility] [address]   Phone: [phone]  Fax: [phone]

## 2023-12-30 ENCOUNTER — Other Ambulatory Visit: Payer: Self-pay | Admitting: Psychiatry

## 2023-12-30 DIAGNOSIS — F411 Generalized anxiety disorder: Secondary | ICD-10-CM

## 2023-12-30 DIAGNOSIS — F5105 Insomnia due to other mental disorder: Secondary | ICD-10-CM

## 2023-12-30 DIAGNOSIS — I10 Essential (primary) hypertension: Secondary | ICD-10-CM

## 2023-12-31 ENCOUNTER — Ambulatory Visit (INDEPENDENT_AMBULATORY_CARE_PROVIDER_SITE_OTHER): Payer: Medicare Other | Admitting: Psychiatry

## 2023-12-31 NOTE — Progress Notes (Signed)
No show.  [place]

## 2024-01-01 ENCOUNTER — Other Ambulatory Visit: Payer: Self-pay

## 2024-01-01 ENCOUNTER — Telehealth: Payer: Self-pay | Admitting: Psychiatry

## 2024-01-01 DIAGNOSIS — F5105 Insomnia due to other mental disorder: Secondary | ICD-10-CM

## 2024-01-01 DIAGNOSIS — F411 Generalized anxiety disorder: Secondary | ICD-10-CM

## 2024-01-01 DIAGNOSIS — F900 Attention-deficit hyperactivity disorder, predominantly inattentive type: Secondary | ICD-10-CM

## 2024-01-01 MED ORDER — AMPHETAMINE-DEXTROAMPHET ER 20 MG PO CP24
20.0000 mg | ORAL_CAPSULE | ORAL | 0 refills | Status: DC
Start: 2024-01-29 — End: 2024-04-07

## 2024-01-01 MED ORDER — AMPHETAMINE-DEXTROAMPHET ER 20 MG PO CP24: 20.0000 mg | ORAL_CAPSULE | ORAL | 0 refills | Status: DC

## 2024-01-01 MED ORDER — LORAZEPAM 1 MG PO TABS: 1.0000 mg | ORAL_TABLET | Freq: Three times a day (TID) | ORAL | 0 refills | Status: DC | PRN

## 2024-01-01 NOTE — Telephone Encounter (Signed)
Pended adderall and lorazepam to [facility].

## 2024-01-01 NOTE — Telephone Encounter (Signed)
Laura Chang called at 10:34 to request refill of her Adderall and Lorazepam.  Appt 4/30.  She missed her recent appt because her daughter-in-law had a baby premature and she had to go to [place].  Please send her refill to  [id number] - [place], [place] - [address] AT [address]NER OF [address]

## 2024-01-29 ENCOUNTER — Other Ambulatory Visit: Payer: Self-pay | Admitting: Psychiatry

## 2024-01-29 DIAGNOSIS — I10 Essential (primary) hypertension: Secondary | ICD-10-CM

## 2024-01-29 DIAGNOSIS — F411 Generalized anxiety disorder: Secondary | ICD-10-CM

## 2024-01-29 DIAGNOSIS — F5105 Insomnia due to other mental disorder: Secondary | ICD-10-CM

## 2024-02-03 ENCOUNTER — Other Ambulatory Visit: Payer: Self-pay | Admitting: Psychiatry

## 2024-02-03 DIAGNOSIS — F411 Generalized anxiety disorder: Secondary | ICD-10-CM

## 2024-02-03 DIAGNOSIS — F5105 Insomnia due to other mental disorder: Secondary | ICD-10-CM

## 2024-02-11 ENCOUNTER — Other Ambulatory Visit: Payer: Self-pay | Admitting: Psychiatry

## 2024-02-11 DIAGNOSIS — F339 Major depressive disorder, recurrent, unspecified: Secondary | ICD-10-CM

## 2024-03-02 ENCOUNTER — Other Ambulatory Visit: Payer: Self-pay | Admitting: Psychiatry

## 2024-03-02 DIAGNOSIS — F5105 Insomnia due to other mental disorder: Secondary | ICD-10-CM

## 2024-03-02 DIAGNOSIS — F411 Generalized anxiety disorder: Secondary | ICD-10-CM

## 2024-03-02 DIAGNOSIS — I10 Essential (primary) hypertension: Secondary | ICD-10-CM

## 2024-03-11 ENCOUNTER — Telehealth: Payer: Medicare Other | Admitting: Psychiatry

## 2024-03-25 ENCOUNTER — Ambulatory Visit: Admitting: Psychiatry

## 2024-03-30 ENCOUNTER — Other Ambulatory Visit: Payer: Self-pay | Admitting: Psychiatry

## 2024-03-30 DIAGNOSIS — F339 Major depressive disorder, recurrent, unspecified: Secondary | ICD-10-CM

## 2024-03-31 ENCOUNTER — Other Ambulatory Visit: Payer: Self-pay | Admitting: Psychiatry

## 2024-03-31 DIAGNOSIS — F5105 Insomnia due to other mental disorder: Secondary | ICD-10-CM

## 2024-03-31 DIAGNOSIS — F411 Generalized anxiety disorder: Secondary | ICD-10-CM

## 2024-03-31 DIAGNOSIS — I10 Essential (primary) hypertension: Secondary | ICD-10-CM

## 2024-04-07 ENCOUNTER — Telehealth: Payer: Self-pay | Admitting: Psychiatry

## 2024-04-07 ENCOUNTER — Other Ambulatory Visit: Payer: Self-pay

## 2024-04-07 DIAGNOSIS — F900 Attention-deficit hyperactivity disorder, predominantly inattentive type: Secondary | ICD-10-CM

## 2024-04-07 MED ORDER — AMPHETAMINE-DEXTROAMPHET ER 20 MG PO CP24: 20.0000 mg | ORAL_CAPSULE | ORAL | 0 refills | Status: DC

## 2024-04-07 NOTE — Telephone Encounter (Signed)
 Pt Lvm @ 3:25p requesting refill of Adderall to  [id number] - [place], [place] - [address] [facility] [address], [place] [address] Phone: [phone]  Fax: [phone]   She is out of medication.   Next appt 6/19

## 2024-04-07 NOTE — Telephone Encounter (Signed)
 [facility]

## 2024-04-30 ENCOUNTER — Ambulatory Visit: Admitting: Psychiatry

## 2024-05-04 ENCOUNTER — Other Ambulatory Visit: Payer: Self-pay | Admitting: Psychiatry

## 2024-05-04 DIAGNOSIS — I10 Essential (primary) hypertension: Secondary | ICD-10-CM

## 2024-05-04 DIAGNOSIS — F411 Generalized anxiety disorder: Secondary | ICD-10-CM

## 2024-05-04 DIAGNOSIS — F5105 Insomnia due to other mental disorder: Secondary | ICD-10-CM

## 2024-05-08 ENCOUNTER — Telehealth: Payer: Self-pay | Admitting: Psychiatry

## 2024-05-08 ENCOUNTER — Other Ambulatory Visit: Payer: Self-pay

## 2024-05-08 DIAGNOSIS — F411 Generalized anxiety disorder: Secondary | ICD-10-CM

## 2024-05-08 DIAGNOSIS — F5105 Insomnia due to other mental disorder: Secondary | ICD-10-CM

## 2024-05-08 DIAGNOSIS — F900 Attention-deficit hyperactivity disorder, predominantly inattentive type: Secondary | ICD-10-CM

## 2024-05-08 MED ORDER — LORAZEPAM 1 MG PO TABS: 1.0000 mg | ORAL_TABLET | Freq: Three times a day (TID) | ORAL | 0 refills | Status: DC | PRN

## 2024-05-08 MED ORDER — AMPHETAMINE-DEXTROAMPHET ER 20 MG PO CP24
20.0000 mg | ORAL_CAPSULE | ORAL | 0 refills | Status: DC
Start: 2024-06-05 — End: 2024-08-07

## 2024-05-08 MED ORDER — AMPHETAMINE-DEXTROAMPHET ER 20 MG PO CP24
20.0000 mg | ORAL_CAPSULE | ORAL | 0 refills | Status: DC
Start: 2024-05-08 — End: 2024-07-08

## 2024-05-08 NOTE — Telephone Encounter (Signed)
 Pt called in at 11:32a to request refill on Adderall and Lorazepam .   Next appt 8/20

## 2024-05-08 NOTE — Telephone Encounter (Signed)
 Called to [facility] [address]. Pended both Adderall and lorazepam .  Has 2 NS and a cancellation.

## 2024-05-22 ENCOUNTER — Telehealth: Payer: Self-pay | Admitting: Psychiatry

## 2024-05-22 NOTE — Telephone Encounter (Signed)
 Next visit is 07/01/24. Requesting refill on Bupropion  called to:  [id number] - [place] [address], [place] - [address] AT [facility] [address]   Phone: [phone]  Fax: [phone]

## 2024-05-22 NOTE — Telephone Encounter (Signed)
 Called patient back and she reported she got her bupropion .

## 2024-05-22 NOTE — Telephone Encounter (Signed)
 Next visit is 07/01/24. Requesting refill on Bupropion  called to:  [id number], [place], [place], [address] [place] at corner of [address].   Phone number: [phone] Fax number is [phone].

## 2024-05-22 NOTE — Telephone Encounter (Signed)
 A 90-day supply was sent in May. LVM to [facility].

## 2024-07-01 ENCOUNTER — Ambulatory Visit (INDEPENDENT_AMBULATORY_CARE_PROVIDER_SITE_OTHER): Payer: Self-pay | Admitting: Psychiatry

## 2024-07-01 ENCOUNTER — Encounter: Payer: Self-pay | Admitting: Psychiatry

## 2024-07-01 DIAGNOSIS — Z91199 Patient's noncompliance with other medical treatment and regimen due to unspecified reason: Secondary | ICD-10-CM

## 2024-07-01 NOTE — Progress Notes (Signed)
 No show

## 2024-07-08 ENCOUNTER — Ambulatory Visit (INDEPENDENT_AMBULATORY_CARE_PROVIDER_SITE_OTHER): Admitting: Psychiatry

## 2024-07-08 ENCOUNTER — Encounter: Payer: Self-pay | Admitting: Psychiatry

## 2024-07-08 DIAGNOSIS — F900 Attention-deficit hyperactivity disorder, predominantly inattentive type: Secondary | ICD-10-CM

## 2024-07-08 DIAGNOSIS — F339 Major depressive disorder, recurrent, unspecified: Secondary | ICD-10-CM | POA: Diagnosis not present

## 2024-07-08 DIAGNOSIS — F411 Generalized anxiety disorder: Secondary | ICD-10-CM

## 2024-07-08 DIAGNOSIS — F5105 Insomnia due to other mental disorder: Secondary | ICD-10-CM

## 2024-07-08 MED ORDER — BUPROPION HCL ER (SR) 100 MG PO TB12: 100.0000 mg | ORAL_TABLET | Freq: Two times a day (BID) | ORAL | 1 refills | Status: DC

## 2024-07-08 MED ORDER — AMPHETAMINE-DEXTROAMPHET ER 20 MG PO CP24: 20.0000 mg | ORAL_CAPSULE | ORAL | 0 refills | Status: DC

## 2024-07-08 MED ORDER — PRAMIPEXOLE DIHYDROCHLORIDE 0.5 MG PO TABS: 0.5000 mg | ORAL_TABLET | Freq: Three times a day (TID) | ORAL | 1 refills | Status: DC

## 2024-07-08 MED ORDER — LORAZEPAM 1 MG PO TABS: 1.0000 mg | ORAL_TABLET | Freq: Three times a day (TID) | ORAL | 0 refills | Status: DC | PRN

## 2024-07-08 NOTE — Progress Notes (Signed)
 Laura Chang [id number] Jun 03, 1956 68 y.o.  Subjective:   Patient ID:  Laura Chang is a 68 y.o. (DOB Nov 15, 1955) female.  Chief Complaint:  Chief Complaint  Patient presents with   Follow-up   Depression   Anxiety   ADD      [name] presents to the office today for follow-up of depression and anxiety and ADD.  seen November 12, 2019.  Melted down in 2020.  Went to [facility] in July.  No withdrawal.  1 drink since then.  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 She was on no stimulant and no SSRI but was taking Strattera and Wellbutrin .  The following changes were made. Stop Strattera. OK restart stimulant bc severe ADD Restart Adderall 1 daily for a few days and if tolerated then restart 1 twice daily. If not tolerated reduce the dosage if needed. May need to stop Wellbutrin  if not tolerating the stimulant.  Yes.  DC Wellbutrin  Restart Prozac  20 mg daily.  February 2021 appointment with the following noted: Completed grant proposal.  Couldn't doit without Adderall.  Sold a bunch of work.   Adderall XR lasts about 3 pm.  Strength seems about right.  BP been OK.  Not jittery.   Stopped Wellbutrin  but had no SE. Mood drastically better with grant proposal and back on fluoxetine .  Less depressed and lethargic.  No anxiety.  Cut back on coffee. Started back with devotions and stronger faith. Plan: Continue Prozac  20 mg daily. May have to increase the dose at some point in the future given that she usually was taking higher dosages but she is getting good response at this time. Restart Wellbutrin  off label for ADD since can't get 2 ADDERALL daily. 150 mg daily then 300 mg daily. She can adjust the dose between 150 mg and 300 mg daily to get the optimal effect.   05/11/2020 appointment with the following noted: Has been inconsistent with Prozac  and Wellbutrin . Not sure of the effect of Wellbutrin . Biggest deterrent in work is anxiety.   Some of the work is conceptual and difficult at times.  Can feel she's not up to a project at times.  Overall is OK but would like a steadier benefit from stimulant.  Exhausted from managing concentration and keeping up with things from the day.  Loses things.  Not good keeping up with schedule. Overall productive and emotionally OK. Can feel Adderall wear off. Mood is better in summer and worse in the winter.   F died in 2024-09-19 and that is a loss. No SE Wellbutrin . Still attends AA meetings.  Real benefit from [facility] last year. Recognizes effect of anemia on ADD and mood.  Had iron  infusions last winter. Plan:  Wellbutrin  off label for ADD since can't get 2 ADDERALL daily. 150 mg daily then 300 mg daily.  01/24/2021 appointment with following noted: Doing a program called [employer] mindfulness app since Xmas.  CBT app helped the depression.  App helped her focus better.  Lost sign weight. Writing a lot. Before Xmas felt depressed and started negative thinking worse, self denigrating. Not drinking. More isolated.   Recognizes mo is narcissist.    Didn't tell anyone she was born until 3 mos later.  M aloof and uninterested in pt.  Lied about her birthday.  Mo lack of affection even with pt's kids. Going to AA for a year and it helped her to quit drinking. Also misses kids being gone  with a hole also. Plan: No med changes  05/04/2021 appointment with the following noted: Therapist [name] thinks she's manic. Lost weight to 144#.   States she is still sleeping okay.  Admits she is hyper and recognizes that she is likely manic.  She feels great, euphoric with an increased sense of spiritual connectedness to God.  She has racing thoughts and talks fast and talks a lot and this is noted by her husband.  He thinks she is a bit hyper.  She has been able to maintain sobriety although she will have 1 glass of wine on special occasions but does not drink by herself.  She is not drinking to  excess.  She denies any dangerous impulsivity.  She is clearly not depressed and not particularly anxious.  She has no concerns about her medication and she has been compliant.  06/16/21 appt noted: So much better.  Going through a lot but the manic thing happened on top of it.  So much slower.  Didn't feel like losing anything with risperidone .  Likes the Adderalll at 10 mg. Some drowsiness in the AM and very drowsy from risperidone  2 mg HS. Prayer life is better. Handling stress better. Less depressed with risperidone . Still likes trazodone . Sleeps well. Plan: Reduce Prozac  to 10 mg daily.  Consider stopping it because it can feel the mania however she is reluctant to do that because she fears relapse of depression. Reduce risperidone  to 1.5 mg nightly due to side effects.  Discussed risk of worsening mania.  07/25/2021 appointment with the following noted: Misses the Adderall and hard to function without it. Depressed now. Heavy chest.  Anxious and guilty.  Body feels heavy.   Hates Wellbutrin .   Plan: Increase fluoxetine  to 20 mg daily Add Abilify  1/2 of 15 mg tablet daily Wean wellbutrin  by 1 tablet each week  bc she feels it is not helpful and DT polypharmacy Reduce risperidone  to 1 daily for 1 week and stop it. Disc risk of mania. Increase Adderall to XR 20 mg AM  08/08/21 Much less depressed and starting to feel normal I feel a lot better. No SE.  Speech normal off risperidone . Sleeping OK on trazaodone and enough.   Noticed benefit from Adderall again. Plan: continue fluoxetine  to 20 mg daily Continue Abilify  1/2 of 15 mg tablet daily for depression and mania continue Adderall to XR 20 mg AM  10/10/2021 phone call: Pt stated she feels like the Abilify  should be decreased to 5mg .She said she is depressed but rational and not suicidal.She has an appt Monday and can wait until then if you prefer. MD response: Reduce the Abilify  to 7.5 mg every other day.  We will meet on 10/16/2021  and decide what to do from there.  10/16/2021 appointment with the following noted: More depressed.  Most depressed I've ever been.  Just numb.  Sense of grief.   Thinks the manic episode was unlike anything else she ever had.  Doesn't want to medicate against it.  Don't enjoy people.  Easily overwhelmed.  Had some death thoughts but not suicidal.  Has been functional.  Feels better today after reducing Abilify  to every other day but she is only been doing that for 3 days. A/P: Episode of post manic depression was explained. continue fluoxetine  to 20 mg daily Hold Abilify  for 1 week then resume Abilify  1/2 of 15 mg tablet every other day for depression and mania continue Adderall to XR 20 mg AM  10/27/2021  appointment with the following noted: I'm doing so much better.  Handling the depression better. Better self talk and spiritual focus has helped.   Dep 6/10 manifesting as anxiety with low confidence.   F died 2  years ago and M 68 yo and is dependent . She is working hard to feel better but still feels depressed.  She almost feels like she has a little more anxiety since restarting Abilify  every other day. Plan: continue fluoxetine  to 20 mg daily DC Abilify  .  Vrayalar 1.5 mg QOD to try to get rid of depression ASAP. continue Adderall to XR 20 mg AM  11/10/2021 appointment with the following noted: Busy with Xmas and it was fun with family but then a big let down.  Did well with it.  Functioned well with it.  Working hard on things with depression.  Not shutting down. Not sure but feels better today but yesterday was hard.  Difficulty dealing with mother.  She won't do anything to help herself.  Yesterday with her all day.  Won't do PT and has isolated herself.    Lack of confidence.   No SE with Vraylar.  11/24/21 urgent appointment appt noted: More and more depressed.   So anxious and doesn't want to be alone but can do so. No appetite. Hurts inside. Has had some fleeting suicidal  thoughts but would not act on them.  Tolerating meds. Has been consistent with Vraylar 1.5 mg every other day, fluoxetine  20 mg daily Plan: Increase Vraylar to 1.5 mg daily Change Prozac  to Trintellix 10 mg daily. Discussed side effects of each continue Adderall to XR 20 mg AM  12/27/2021 appointment with the following noted: Not OK.  I feel less depressed but feels bat shit. Not sleeping well.  Extremely anxious. Off and on sleep. 3-4 hours of sleep.   Still having daily SI.  But also become obvious has so much to do.  Overwhelmed by tasks.   Needs anxiety meds to just function. Not more motivated.  Walked yesterday.   Feels afraid like in trouble but not irritable or angry. DC DT agitation Vraylar to 1.5 mg daily Change Prozac  to Trintellix 10 mg daily. Hold Adderall to XR 20 mg AM Clonidine  0.1 1/2 tablet twice daily for 2 days and if needed for anxiety and sleep increase to 1 twice daily Ok temporary Ativan  1 mg 3 times daily as needed anxiety  01/05/22 appt noted: Off fluoxetine  and  Trintellix.  Only on Ativan , trazodone  and Adderall XR 20 plus added clonidine  0.1 mg BID Didn't think she needed to start Trintellix. Not taking Ativan .   Didn't like herself last week. Feels some better today. Wonders if the manic sx Not agitated.  Anxiety kind of calmed down.  A lot to be anxious about situationally.  $ stress. Concerns about downers with meds. Can't access normal personality. ? Lethargy and inability to talk as sE. Plan: Latuda  20-40 mg daily with food. Adderall to XR 20 mg AM Clonidine  0.1 1/2 tablet twice daily  reduce dose to be sure no SE Ok temporary Ativan  1 mg 3 times daily as needed anxiety  01/19/22 appt noted: Taking Latuda  20 mg daily.  Took 40 mg once and felt anxious and  SI Still depressed and not very reactive Anxiety mainly about the depression and fears of the future. She wants to revisit manic sx and thinks it was maybe bc taking delta 8 bc was taking a lot  of it so still  doesn't think she's classic bipolar. She wants to only take Prozac  bc thinks Latuda  is perpetuating depression. Says the delta 8 was very psychaedelic.  When not taking it was not manic.  Sleeping ok again.  Plan: Per her request DC Latuda  20-40 mg daily with food. She wants to continue Prozac  alone AMA  Adderall to XR 20 mg AM Clonidine  0.1 1/2 tablet twice daily  reduce dose to be sure no SE Ok temporary Ativan  1 mg 3 times daily as needed anxiety  01/23/2022 phone call complaining of increased anxiety since stopping Latuda .  She will try increasing clonidine .  01/26/2022 phone call not feeling well and wanted to restart the Vraylar.  However notes indicate that had made her agitated therefore she was encouraged to pick up samples of Rexulti 1 mg and start that instead.  02/06/2022 phone call: Stating she felt the Rexulti was helping with depression but she was not sleeping well and obsessing over things.  She was encouraged to increase Rexulti to 2 mg daily and increase trazodone  for sleep.  02/09/2022 appointment with the following noted: This was an urgent work in appointment No sleep last night with trazodone  100 mg HS Nothing really better depression or anxiety. Ruminating negative anxious thoughts. Did not tolerate Rexulti because it was causing insomnia.  Does not think it helped depression.  Lacks emotion that she should have.  Lacks her usual personality.  Some hopeless thoughts.  Some death thoughts.  Some suicidal thoughts without plan or intent Plan: DC Rexulti and Prozac  & DC trazodone  Adderall to XR 20 mg AM Clonidine  0.1 1/tablet twice daily  reduce dose to be sure no SE Ok temporary Ativan  1 mg 3 times daily as needed anxiety Start Seroquel  XR 150 mg nightly  03/02/2022 appointment: Karna called back a few days after starting Seroquel  stating it was making her more anxious and more depressed.  This seemed unlikely as this medicine rarely ever causes anxiety.   She stopped the medication waited 3 days and called back still had anxiety and depression but thought perhaps the anxiety was a little better.  She did not want to take the Seroquel . She knew about the option of Spravato  and wanted to pursue that. Now questions whether to return to Seroquel  while waiting to start Spravato  bc feels just as bad without it and knows she didn't give it enough time to work.   MADRS 46  ECT-MADRS    Flowsheet Row Clinical Support from 07/11/2023 in [facility] Clinical Support from 01/08/2023 in [facility] Clinical Support from 08/06/2022 in [facility] Clinical Support from 07/04/2022 in [facility] Clinical Support from 05/21/2022 in [facility]  MADRS Total Score 9 21 29 15 27    03/14/22 appt noted: Pt received Spravato  56 mg first dose today with some dissociative sx which were not severe.  She was anxious prior to the administration and felt better after receiving lorazepam  1 mg.  No NV, or HA. Wants to continue Spravato . Ongoing depression and desperate to feel better.  I'm not myself DT deprsssion which is most severe in recent history.  Anhedonia.  Low motivation.  Social avoidance. Continues to think all recent med trials are making her worse.  Sleep ok with Seroquel .  03/16/22 appt noted: Received Spravato  84 mg for the first time.  some dissociative sx which were not severe.  She was anxious prior to the administration and  felt better after receiving lorazepam  1 mg.  No NV, or HA. Wants to continue Spravato .   Does not feel any better or different since the last appt.  Ongoing depression.  Ongoing depression and desperate to feel better.  I'm not myself DT deprsssion which is most severe in recent history.  Anhedonia.  Low motivation.  Social avoidance. Continues to think all recent med trials are making her worse.  Sleep  ok with Seroquel .  Does not want to continue Seroquel  for TRD.  03/20/2022 appointment noted: Came for Spravato  administration today.  However blood pressure was significantly elevated approximately 180/115.  She was given lorazepam  1 mg and clonidine  0.2 mg to try to get it down. She states she regretted stopping the Seroquel  XR 300 mg tablets.  She now realizes it was helpful.  She did not sleep much at all last night.  She did not take the Adderall this morning. 2 to 3 hours after arrival blood pressure was still elevated at  170/110, 62 pulse.  For Spravato  administration was canceled for today.  She admits to being anxious and depressed.  She is not suicidal.  She is highly motivated to receive the Spravato .  We discussed getting it tomorrow.  03/22/2022 appointment noted: Patient's blood pressure was never stable enough yesterday in order to get her in for Spravato  administration.  She was encouraged to see her primary care doctor.  It is better today.  03/26/2022 appointment with the following noted: Blood pressure was better.  Saw her primary care doctor who started on oral Bystolic 2.5 mg daily. Received Spravato  84 mg today as scheduled.  Tolerated it well without nausea or vomiting headache or chest pain or palpitations.  Her blood pressure was borderline but manageable. She remains depressed and anxious.  She is ambivalent about the medicine and desperate to get to feel better.  Continues to have anhedonia and low energy and low motivation and reduced ability to do things.  Less social.  Not suicidal.  03/28/22 appt noted: Received Spravato  84 mg today as scheduled.  Tolerated it well without nausea or vomiting headache or chest pain or palpitations.  Her blood pressure was borderline but manageable. Has not seen any improvement so far.  Tolerating Seroquel .  Inconsistent with Bystolic and BP has been borderline high. Still depressed and anxious and anhedonia.  Low motivation, energy,  productivity. Taking quetiapine  and tolerating XR 300 mg nightly.  04/04/22 appt noted: Received Spravato  84 mg today as scheduled.  Tolerated it well without nausea or vomiting headache or chest pain or palpitations.  Her blood pressure was borderline but manageable. Has not seen any improvement so far.  Tolerating Seroquel .   She still tends to think that the medications are making her worse.  She has said this about each of the recent psychiatric medicines including Seroquel .  However her husband thinks she is improved.  She also admits there is some improvement in productivity.  She still feels highly anxious.  She still does not enjoy things as normal.  She still feels desperate to improve as soon as possible. Has been taking Seroquel  XR since 03/20/2022  04/10/22 appt noted: Received Spravato  84 mg today as scheduled.  Tolerated it well without nausea or vomiting headache or chest pain or palpitations.  Her blood pressure was borderline but manageable. Has not seen any improvement so far.  Tolerating Seroquel .  Doesn't like Seroquel  bc she thinks it flattens here. Ongoing depression without confidence Plan: Start Auvelity  1 every  morning for persistent treatment resistant depression  04/12/2022 appointment with the following noted: Received Spravato  84 mg today as scheduled.  Tolerated it well without nausea or vomiting headache or chest pain or palpitations.  Her blood pressure was borderline but manageable. Has not seen any improvement so far.  Tolerating Seroquel .  Doesn't like Seroquel  bc she thinks it flattens her. Received Spravato  84 mg today as scheduled.  Tolerated it well without nausea or vomiting headache or chest pain or palpitations.  Her blood pressure was borderline but manageable. Has not seen any improvement so far.  Tolerating Seroquel .  Doesn't like Seroquel  bc she thinks it flattens here.  We discussed her ambivalence about it. She is starting Auvelity  and has tolerated it  the last 2 days without side effect.  She still does not feel like herself and feels flat and not enjoying things with suppressed expressed emotion  04/17/2022 appointment with the following noted: Received Spravato  84 mg today as scheduled.  Tolerated it well without nausea or vomiting headache or chest pain or palpitations.  Her blood pressure was borderline but manageable. Has not seen any improvement so far.  Tolerating Seroquel .  Doesn't like Seroquel  bc she thinks it flattens her. She has been tolerating the Auvelity  1 in the morning without side effects for about a week.  She has not noticed significant improvement so far.  She still feels depressed and flat and not herself.  Other people notice that she is flat emotionally.  She is not suicidal.  She does feel discouraged that she is not getting better yet.  04/19/2022 appointment noted: Has increased Auvelity  to 1 twice daily for 2 days, continues quetiapine  XR 300 mg nightly, clonidine  0.3 mg twice daily, lorazepam  1 mg twice daily for anxiety and Adderall XR 20 mg in the morning. No obious SE but she still thinks quetiapine  XR is making her feel down.  But not sedated Received Spravato  84 mg today as scheduled.  Tolerated it well without nausea or vomiting headache or chest pain or palpitations.  Her blood pressure was borderline but manageable. She still feels quite anxious and feels it necessary to take both the clonidine  and lorazepam  twice a day to manage her anxiety.  She has been consistently down and flat and not herself until yesterday afternoon she noted an improvement in mood and feeling much more like herself with her normal personality reemerging.  She was quite depressed in the morning with very dark negative thoughts.  She did not have those dark negative thoughts this morning.  She had a lot of questions about medication and when she was expecting to be improved and why she has not shown improvement up to now.  04/23/22 appt  noted: Has increased Auvelity  to 1 twice daily for 1 week, continues quetiapine  XR 300 mg nightly, clonidine  0.3 mg twice daily, lorazepam  1 mg twice daily for anxiety and Adderall XR 20 mg in the morning. No obious SE but she still thinks quetiapine  XR is making her feel down.  But not sedated Received Spravato  84 mg today as scheduled.  Tolerated it well without nausea or vomiting headache or chest pain or palpitations.  She is still depressed but admits better function and is able to enjoy social interactions. Tolerating meds.  Would like to feel better for sure. Not herself.  Flat. Plan increase Auvelity  to 1 tab BID as planned and reduce Quetiapine  to 1/2 of ER 300 mg  bc NR for depression.  04/25/2022 appointment with  the following noted: clonidine  0.3 mg twice daily, lorazepam  1 mg twice daily for anxiety and Adderall XR 20 mg in the morning. Seroquel  XR 300 HS No obious SE but she still thinks quetiapine  XR is making her feel down.  But not sedated Received Spravato  84 mg today as scheduled.  Tolerated it well without nausea or vomiting headache or chest pain or palpitations.  Called yesterday with more anxiety.  Had increased Auvelity  for 1 day and reduced Seroquel  XR for 1 day.  Felt restless and fearful  05/01/2022 appointment noted: clonidine  0.3 mg twice daily, lorazepam  1 mg twice daily for anxiety and Adderall XR 20 mg in the morning. Seroquel  XR 150 HS, Auvelity  1 BID Received Spravato  84 mg today as scheduled.  Tolerated it well without nausea or vomiting headache or chest pain or palpitations.  Nurse has noted patient has called multiple times sometimes asking the same question repeatedly.  It is unclear whether she is truly forgetful or is just anxious seeking reassurance. Patient acknowledges ongoing depression as well as some anxiety but states she has felt a little better in the last couple of days.  She has reduced the Seroquel  to 150 mg at night and has increased Auvelity  to 1  twice daily but only for 1 day.  So far she seems to be tolerating it.  05/03/22 appt noted: clonidine  0.2 mg twice daily, lorazepam  1 mg twice daily for anxiety and Adderall XR 20 mg in the morning. Seroquel  XR 150 HS, Auvelity  1 BID BP high this am about 170/100 and received extra clonidine  0.2 mg and came to receive Spravato .  Not dizzy, no SOB, nor CP but BP is still high Could not receive Spravato  today bc BP high and pulse low at 30 ppm. Still depressed and anxious. Plan: continue trial Auvelity  with Spravato  She needs to get BP and pulse managed  05/08/22 TC: RTC  H Michael NA and mailbox full.  Could not leave message.  Pt  -  talked to she and H on speaker. H worried over wife.  Vacant stare.  Slurs words at times.  Not smiling. Reduced enjoyment.  Depression.  Withdrawn from usual activities.  Some irritability.  Anxious. Disc her concerns meds are making her worse.  Extensive discussion about her treatment resistant status.  There is a consistent pattern of not taking the medicines long enough to get benefit because she believes the meds are making her worse.  However the symptoms she describes as side effects are exactly the same symptoms that she had prior to taking the medication RX for  the depression.  So it is not clear that these are actual side effects. This is true about the 2 most recent meds including Seroquel  and Auvelity .  Recommend psychiatric consultation in hopes of improving her comfort level with taking prescribed medications for a sufficient length of time to provide benefit. Extensive discussion about ECT is the treatment of choice for treatment resistant depression.  Spravato  may work if she can comply with consistency.  There are medication options but they take longer to work.   Plan:  Reduce clonidine  to 0.1 mg BID DT bradycardia.  Talk with PCP about BP and low pulse problems which are interfering with her consistent compliance with Spravato .   Limit lorazepam  to  3 -4  mg daily max. Excess use is the cause of slurring speech.  She must stop excess use or will have to stop the med. Stop Auvelity  per her request.  But she has only been on the full dose for a little over a week and clearly has not had time to get benefit from it.  She thinks maybe it is making her more anxious. Reduce Seroquel  from 150XR to 50 -100 mg at night IR.  She couldn't sleep when stopped it completely. Will not start new antidepressant until her SE issues are resolved or not. Get second psych opinion from Medford Croak MD or another psychiatrist.  H's sister is therapist in [place] Lorene Macintosh, MD, [facility]  05/16/2022 appointment with the following noted: Received Spravato  84 mg today as scheduled.  Tolerated it well without nausea or vomiting headache or chest pain or palpitations.  She stopped Auvelity  as discussed last week. On her own, without physician input, she restarted Wellbutrin  XL 450 mg every morning today.  She had taken it in the past.  She feels jittery and anxious. She feels less depressed than she did last week.  But she is still depressed without her usual range of affect.  She still is less social and less motivated than normal. Her primary care doctor increased the dose of losartan Plan: Stop Seroquel  Reduce Wellbutrin  XL to 300 mg every morning.  Starting the dose at 450 every morning is likely causing side effects of jitteriness and it should not be started at that have a dosage. Recommend she not change meds on her own without MDM put  05/23/2022 appointment with the following noted: Received Spravato  84 mg today as scheduled.  Tolerated it well without nausea or vomiting headache or chest pain or palpitations.  Has not dropped seroquel  XR 300 mg 1/2 tablet nightly bc couldn't sleep without it. Has not tried lower dose quetiapine  50 mg HS Still feels depressed.   BP is better managed so far, just saw PCP.  BP is better today and infact is low today. Dropped  clonidine  as directed from 0.3 mg BID bc inadequate control of BP to 0.2 mg BID.  However she wants to increase it back to 0.3 mg twice daily because she feels it helped her anxiety better.  Wonders about increasing Wellbutrin  for depression.  However she has only been on 300 mg a day for a week.  She was on 450 mg daily in the past.  06/06/22 appt noted: Received Spravato  84 mg today as scheduled.  Tolerated it well without nausea or vomiting headache or chest pain or palpitations.  She is still depressed and anxious.  She wants to try to stop the Seroquel  but cannot sleep without some of it.  She is taking lorazepam  1 mg 4 times daily and still having a lot of anxiety.  She wants to increase clonidine  back to 0.3 mg twice daily.  She hopes for more improvement She recently went for a second psychiatric opinion as suggested the results of that are pending.  06/11/22 appt noted: Received Spravato  84 mg today as scheduled.  Tolerated it well without nausea or vomiting headache or chest pain or palpitations.  She is still depressed and anxious. Without much change.  Still hopeless, anhedonia, reduced inteterest and motivation.  Tolerating meds. Disc concerns Spravato  is not hleping much. Plan: stop Seroquel  and start olanzapine  10 mg HS for TRD and anxiety.  06/13/2022 appointment noted: Received Spravato  84 mg today as scheduled.  Tolerated it well without nausea or vomiting headache or chest pain or palpitations.  She is still depressed and anxious. Without much change.  Still hopeless, anhedonia, reduced inteterest and motivation.  Tolerating meds. Disc concerns Spravato  is not helping much as hoped but is improving a bit in the last week. Tolerating meds. Continues Wellbutrin  XL 450 AM, tolerating recently started olanzapine   10 mg HS. Sleep is good.   Pending appt with TMS consult.  06/18/22 appt noted: Received Spravato  84 mg today as scheduled.  Tolerated it well without nausea or vomiting  headache or chest pain or palpitations.  Tolerating meds. Continues Wellbutrin  XL 450 AM, tolerating recently started olanzapine   10 mg HS. Continues Adderall XR 20 amd and has tried to reduce lorazepam  to 1mg  TID Sleep is good.   Pending appt with TMS consult. Depression is a little bit better in the last week with a little improvement in emotional expression and interest.  She is pushing herself to be more active.  Her daughter thought she was a little better than she has been.  However she is still depressed and still not her normal self with anhedonia and reduced emotional expressiveness.  06/20/22 appt noted: Received Spravato  84 mg today as scheduled.  Tolerated it well without nausea or vomiting headache or chest pain or palpitations.  Tolerating meds with a little sleepiness. Continues Wellbutrin  XL 450 AM, tolerating recently started olanzapine   10 mg HS. Continues Adderall XR 20 amd and has tried to reduce lorazepam  to 1mg  TID Sleep is good.   Mood is improving.  Better funciton.  Anxiety is better with olanzapine . Still not herself and depression not gone with some anhedonia and social avoidance and feeling overwhelmed.  8/14 2023 received Spravato  84 mg 06/27/2022 received Spravato  Spravato  84 mg 07/02/2022 received Spravato  84 mg 07/04/2022 received Spravato  84 mg  07/09/2022 appointment noted: Received Spravato  84 mg today as scheduled.  Tolerated it well without nausea or vomiting headache or chest pain or palpitations.  Expected dissociation and feels less depressed with resolution of negative emotions immediately after Spravato  and then depression, anxiety creep back in. Continues meds Adderall XR 20 mg every morning, Wellbutrin  XL 450 every morning, clonidine  0.1 mg twice daily, lorazepam  1 mg every 6 hours as needed, olanzapine  increased from 7.5 to 10 mg nightly on  Tolerating meds.  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  She does note some increased appetite and is somewhat concerned about that but has not gained significant amounts of weight. She has had the TMS consultation which was initially denied but she knows it can be appealed.  However because she is improving with Spravato  plus the other medications now she wants to continue the current treatment plan.  07/18/22 appt noted: Continues meds Adderall XR 20 mg every morning, Wellbutrin  XL 450 every morning, clonidine  0.1 mg twice daily, lorazepam  1 mg every 6 hours as needed, olanzapine  increased from 7.5 to 10 mg nightly on 07/04/2022. Received Spravato  84 mg today as scheduled.  Tolerated it well without nausea or vomiting headache or chest pain or palpitations.  Expected dissociation and feels less depressed with resolution of negative emotions immediately after Spravato  and then depression, anxiety creep back in. Continues meds Adderall XR 20 mg every morning, Wellbutrin  XL 450 every morning, clonidine  0.1 mg twice daily, lorazepam  1 mg every 6 hours as needed, olanzapine  increased from 7.5 to 10 mg nightly on  Tolerating meds.  She notes she is clearly improved with regard to depression and anxiety since the switch from Seroquel  to olanzapine  10 mg  nightly for treatment resistant depression.  She does note some increased appetite and is somewhat concerned about that but has not gained significant amounts of weight. She has had the TMS consultation which was initially denied but she knows it can be appealed. She continues to have chronic ambivalence about psychiatric medicines and initially tends to blame her depressive symptoms such as decreased concentration and feeling flat on what ever medicine she currently is taking even though she had the same symptoms before the current medicines were started.  Then after discussion she does admit that her depressive symptoms are improved since adding  olanzapine  but still has those residual symptoms noted.  07/23/22 received Spravato  84 mg   07/30/2022 appointment noted: Received Spravato  84 mg today as scheduled.  Tolerated it well without nausea or vomiting headache or chest pain or palpitations.  Expected dissociation and feels less depressed with resolution of negative emotions immediately after Spravato  and then depression, anxiety creep back in. Continues meds Adderall XR 20 mg every morning, Wellbutrin  XL 450 every morning, clonidine  0.1 mg twice daily, lorazepam  1 mg every 6 hours as needed, olanzapine  increased from 7.5 to 10 mg nightly on  She has been inconsistent with olanzapine  because she continues to be ambivalent about the medications in general and thinks that perhaps the 10 mg is making her feel blunted.  She continues to feel some depression.  She had a good day this week and but still feels somewhat depressed and persistently anxious. Plan: be consistent with olanzapine  10 mg HS for TRD and longer trial for potential benefit for anxiety.  Has not taken it consistently.  08/06/2022 appointment noted: Received Spravato  84 mg today as scheduled.  Tolerated it well without nausea or vomiting headache or chest pain or palpitations.  Expected dissociation and feels less depressed with resolution of negative emotions immediately after Spravato  and then depression, anxiety creep back in. Continues meds Adderall XR 20 mg every morning, Wellbutrin  XL 450 every morning, clonidine  0.1 mg twice daily, lorazepam  1 mg every 6 hours as needed, olanzapine  i 10 mg nightly  She continues to feel depressed but is about 50% better with Spravato .  She is still not herself.  She still has anhedonia.  She still is not her able to engage socially in the typical ways.  She is not jovial and outgoing like normal.  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  She continues to feel that her personality is dampened down.  There is a question about whether it is related to depression or medication. Plan: continue olanzapine  10 for longer trial for TRD and severe anxiety.  08/13/22 appt noted:  Received Spravato  84 mg today as scheduled.  Tolerated it well without nausea or vomiting headache or chest pain or palpitations.  Expected dissociation and feels less depressed with resolution of negative emotions immediately after Spravato  and then depression, anxiety creep back in. Continues meds Adderall XR 20 mg every morning, Wellbutrin  XL 450 every morning, clonidine  0.1 mg twice daily, lorazepam  1 mg every 6 hours as needed, olanzapine  i 10 mg nightly  She still does not feel herself.  Still struggles with depression and low motivation and reduced social engagement and reduced interest and reduced emotional expression.  She is somewhat better with the medicines plus Spravato .  She still believes the Spravato  makes her blunted and is not sure  how much it helps her anxiety.  She can have good days when her family is around and she is engaged.  She still wants to stop the olanzapine . She has apparently continued to take the trazodone  despite having been told to stop it when she started olanzapine .  She feels like she needs the trazodone . Plan: DC olanzapine  and Start nortriptyline  25 mg nightly and build up to 75 mg nightly and then check blood level.    08/27/2022 appointment noted: Received Spravato  84 mg today as scheduled.  Tolerated it well without nausea or vomiting headache or chest pain or palpitations.  Expected dissociation and feels less depressed with resolution of negative emotions immediately after Spravato  and then depression, anxiety creep back in. Continues meds Adderall XR 20 mg every morning, Wellbutrin  XL 450 every morning, clonidine  0.1 mg twice daily, lorazepam  1 mg every 6 hours as needed. Stopped olanzapine  and started  nortriptyline  which she has taken for about a week is 75 mg nightly. So far she is tolerating the nortriptyline  well with the exception of some dry mouth and constipation which she is working to manage.  She does not feel substantially better better or different off the olanzapine .  No change in her sleep which is good.  Main concern currently in addition to the residual depression is anxiety which is somewhat situational with pending arch show.  She is worrying about it more than normal.  Says she is having to take lorazepam  twice a day where she had been able to keep reduce it prior to this.  She still does not feel like herself with residual depression with less social interest and less of her usual buoyancy in personality.  She is flatter than normal.  Overall she still feels that the Spravato  has been helpful at reducing the severity of the depression.  She is not suicidal. She has not heard anything about the TMS appeal as of yet.  09/05/2022 appointment noted: Received Spravato  84 mg today as scheduled.  Tolerated it well without nausea or vomiting headache or chest pain or palpitations.  Expected dissociation and feels less depressed with resolution of negative emotions immediately after Spravato  and then depression, anxiety creep back in. Continues meds Adderall XR 20 mg every morning, Wellbutrin  XL 450 every morning, clonidine  0.1 mg twice daily, lorazepam  1 mg every 6 hours as needed. Stopped olanzapine  and started nortriptyline  which she has taken for about 2 week is 75 mg nightly. Initially blood pressure was a little high causing delay in starting Spravato .  She admitted to feeling a little wound up.  She still experiences a little increase in depression if she goes longer than a week in between doses of Spravato .  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  09/10/22 appt noted: Received  Spravato  84 mg today as scheduled.  Tolerated it well without nausea or vomiting headache or chest pain or palpitations.  Expected dissociation gradually resolved over the 2 hour observation period. She feels 50% less depressed with Spravto and wants to continue it.   Continues meds Adderall XR 20 mg every morning, Wellbutrin  XL 450 every morning, clonidine  0.1 mg twice daily, lorazepam  1 mg every 6 hours as needed. Has started nortriptyline  75 mg nightly for about 3 weeks. Has not seen a significant difference with the addition of nortriptyline .  Tolerating it pretty well. She continues to have some  degree of anhedonia and significant depression and anxiety.  Her daughters noticed that she is more needy and calls more frequently.  She acknowledges this as well.  She is clearly still not herself. Plan: pramipexole  off label and RX 0.25 mg BID  09/17/2022 appointment noted: Received Spravato  84 mg today as scheduled.  Tolerated it well without nausea or vomiting headache or chest pain or palpitations.  Expected dissociation gradually resolved over the 2 hour observation period. She feels 50% less depressed with Spravto and wants to continue it.   Continues meds Adderall XR 20 mg every morning, Wellbutrin  XL 450 every morning, clonidine  0.1 mg twice daily, lorazepam  1 mg every 6 hours as needed. Has started nortriptyline  75 mg nightly for about 3 weeks and DT level 176, reduced to 50 mg HS early November. Still the same sx as noted last visit.  Tolerating meds.   Compliant.  Still depressed and family notices.  Has been able to participate in family interactions.  Some post-show let down and has to do detailed work which is hard for her bc ADD.  Sleep and eating well.  Energy OK but not great.  No SI.  Not cried in a year or so.  Clearly less depressed and hopeless than before the Spravato .  09/24/22 appt noted: Received Spravato  84 mg today as scheduled.  Tolerated it well without nausea or vomiting  headache or chest pain or palpitations.  Expected dissociation gradually resolved over the 2 hour observation period. She feels 50% less depressed with Spravto and wants to continue it.   Continues meds Adderall XR 20 mg every morning, Wellbutrin  XL 450 every morning, clonidine  0.1 mg twice daily, lorazepam  1 mg every 6 hours as needed. Has started nortriptyline  75 mg nightly for about 3 weeks and DT level 176, reduced to 50 mg HS early November. Still the same sx as noted last visit.  Tolerating meds.   Compliant.  Still depressed and family notices.  Has been able to participate in family interactions.  Some post-show let down and has to do detailed work which is hard for her bc ADD.  Sleep and eating well.  Energy OK but not great.  No SI.  Not cried in a year or so.  Clearly less depressed and hopeless than before the Spravato . Is not making further progress generally.  Stuck with moderate depression  10/02/22 appt noted: Received Spravato  84 mg today as scheduled.  Tolerated it well without nausea or vomiting headache or chest pain or palpitations.  Expected dissociation gradually resolved over the 2 hour observation period. She feels 50% less depressed with Spravto and wants to continue it.   Continues meds Adderall XR 20 mg every morning, Wellbutrin  XL 450 every morning, clonidine  0.1 mg twice daily, lorazepam  1 mg every 6 hours as needed. Has started nortriptyline  75 mg nightly for about 3 weeks and DT level 176, reduced to 50 mg HS early November. Still the same sx as noted last visit.  Tolerating meds.   Compliant.  Still depressed and family notices.  Has been able to participate in family interactions.  Some post-show let down and has to do detailed work which is hard for her bc ADD.  Sleep and eating well.  Energy OK but not great.  No SI.  Not cried in a year or so.  Clearly less depressed and hopeless than before the Spravato . Is not making further progress generally.  Stuck with moderate  depression.  Is able to function  pretty normally. Plan: trial pramipexole  0.25 mg BID off label for depression.   10/08/22 appt noted: Received Spravato  84 mg today as scheduled.  Tolerated it well without nausea or vomiting headache or chest pain or palpitations.  Expected dissociation gradually resolved over the 2 hour observation period. She feels 50% less depressed with Spravto and wants to continue it.   Continues meds Adderall XR 20 mg every morning, Wellbutrin  XL 450 every morning, clonidine  0.1 mg twice daily, lorazepam  1 mg every 6 hours as needed. Has started nortriptyline  75 mg nightly for about 3 weeks and DT level 176, reduced to 50 mg HS early November. Still the same sx as noted last visit.  Tolerating meds.   Compliant.  Still depressed and family notices.  Has been able to participate in family interactions.  Some post-show let down and has to do detailed work which is hard for her bc ADD.  Sleep and eating well.  Energy OK but not great.  No SI.  Not cried in a year or so.  Clearly less depressed and hopeless than before the Spravato . Is not making further progress generally.  Stuck with moderate depression.  Behaved and felt pretty normally with family over for Thanksgiving. Doesn't see benefit or SE with pramipexole  but thinks maybe it makes her worse. Plan:  increase pramipexole  augmentation off label to 0.5 mg BID  10/15/2022 appointment noted: Received Spravato  84 mg today as scheduled.  Tolerated it well without nausea or vomiting headache or chest pain or palpitations.  Expected dissociation gradually resolved over the 2 hour observation period. She feels 50% less depressed with Spravto and wants to continue it.   Continues meds Adderall XR 20 mg every morning, Wellbutrin  XL 450 every morning, clonidine  0.2 mg twice daily, lorazepam  1 mg every 6 hours as needed.  Trazodone  50 mg tablets 1-2 nightly as needed insomnia Has started nortriptyline  75 mg nightly for about 3 weeks  and DT level 176, reduced to 50 mg HS early November. Recommended increase pramipexole  0.5 mg twice daily off label for treatment resistant depression on 10/08/2022. She feels better motivated more active with pramipexole  0.5 mg twice daily.  She still is depressed but it is better.  We discussed the possibility of going up in the dose but did not change it.  10/22/2022 appointment noted: Received Spravato  84 mg today as scheduled.  Tolerated it well without nausea or vomiting headache or chest pain or palpitations.  Expected dissociation gradually resolved over the 2 hour observation period. She feels 50% less depressed with Spravto and wants to continue it.   Continues meds Adderall XR 20 mg every morning, Wellbutrin  XL 450 every morning, clonidine  0.2 mg twice daily, lorazepam  1 mg every 8 hours as needed.  Trazodone  50 mg tablets 1-2 nightly as needed insomnia Has started nortriptyline  75 mg nightly for about 3 weeks and DT level 176, reduced to 50 mg HS early November. pramipexole  0.5 mg twice daily off label for treatment resistant depression on 10/08/2022. She is better motivated than she was.  She is journaling 3 pages a day.  She has started walking and has walked 5 days a week for 50 minutes for the last 2 weeks.  That is significantly helped her mood.  Her mood tends to be better when she interacts with family.  However she still has some periods of depression.  She is tolerating the medications well.  She still feels like her affect and confidence is not back to normal.  Plan:  increase pramipexole  augmentation off label to 0.5 mg tID or 0.75 mg BID   10/29/22 appt noted: Received Spravato  84 mg today as scheduled.  Tolerated it well without nausea or vomiting headache or chest pain or palpitations.  Expected dissociation gradually resolved over the 2 hour observation period. She feels 50% less depressed with Spravto and wants to continue it.   Continues meds Adderall XR 20 mg every  morning, Wellbutrin  XL 450 every morning, clonidine  0.2 mg twice daily, lorazepam  1 mg every 8 hours as needed.  Trazodone  50 mg tablets 1-2 nightly as needed insomnia Has started nortriptyline  75 mg nightly for about 3 weeks and DT level 176, reduced to 50 mg HS early November. pramipexole  increased to 0.75 mg twice daily off label for treatment resistant depression  She has been feeling somewhat better with the increase in pramipexole  and is tolerating it well.  She still is easily overwhelmed.  Her affect and mood can improve now when around her family or doing something positive.  She has been able to be more productive. She is tolerating the current medications. She is still considering TMS as an alternative to Spravato .  11/15/2022 appointment noted: Received Spravato  84 mg today as scheduled.  Tolerated it well without nausea or vomiting headache or chest pain or palpitations.  Expected dissociation gradually resolved over the 2 hour observation period. She feels 50% less depressed with Spravto and wants to continue it.   Continues meds Adderall XR 20 mg every morning, Wellbutrin  XL 450 every morning, clonidine  0.2 mg twice daily, lorazepam  1 mg every 8 hours as needed.  Trazodone  50 mg tablets 1-2 nightly as needed insomnia Has started nortriptyline  75 mg nightly for about 3 weeks and DT level 176, reduced to 50 mg HS early November. pramipexole  increased to 0.75 mg twice daily off label for treatment resistant depression  He has noticed some increase in depression due to the length of time since the last Spravato  administration.  She believes Spravato  is helping her.  sHe is not suicidal but has felt very blue the last few days. She is tolerating the medication. She is ambivalent about Spravato  versus TMS but she is considering TMS. She wants to continue Spravato  administration because it is clearly helpful.  11/19/22 appt noted: Received Spravato  84 mg today as scheduled.  Tolerated it well  without nausea or vomiting headache or chest pain or palpitations.  Expected dissociation gradually resolved over the 2 hour observation period. She feels 50% less depressed with Spravto and wants to continue it.   Continues meds Adderall XR 20 mg every morning, Wellbutrin  XL 450 every morning, clonidine  0.2 mg twice daily, lorazepam  1 mg every 8 hours as needed.  Trazodone  50 mg tablets 1-2 nightly as needed insomnia Has started nortriptyline  75 mg nightly for about 3 weeks and DT level 176, reduced to 50 mg HS early November. pramipexole  increased to 0.75 mg twice daily off label for treatment resistant depression  She has felt better back on Spravato  more regularly.  However she still has residual depression esp when alone or when wihtout activity.  Can function when needed.   Does not have her connfidence back. She plans to pursue TMS availability. Have discussed retrying Auvelity   11/28/22 appt noted: Received Spravato  84 mg today as scheduled.  Tolerated it well without nausea or vomiting headache or chest pain or palpitations.  Expected dissociation gradually resolved over the 2 hour observation period. She feels 50% less depressed with Spravto  and wants to continue it.   Continues meds Adderall XR 20 mg every morning, Wellbutrin  XL 450 every morning, clonidine  0.2 mg twice daily, lorazepam  1 mg every 8 hours as needed.  Trazodone  50 mg tablets 1-2 nightly as needed insomnia Has started nortriptyline  75 mg nightly for about 3 weeks and DT level 176, reduced to 50 mg HS early November. pramipexole  increased to 0.75 mg twice daily off label for treatment resistant depression  Last week she was more depressed than usual for reasons that are not clear.  She has not had a clear answer from [employer] about [employer] options.  States that they have not returned her call.  She is asked about Auvelity  retry in place of Wellbutrin .  Has still been walking.  12/03/22 appt noted: Received Spravato  84 mg today  as scheduled.  Tolerated it well without nausea or vomiting headache or chest pain or palpitations.  Expected dissociation gradually resolved over the 2 hour observation period. She feels 50% less depressed with Spravto and wants to continue it.   Continues meds Adderall XR 20 mg every morning, Wellbutrin  XL 450 every morning, clonidine  0.2 mg twice daily, lorazepam  1 mg every 8 hours as needed.  Trazodone  50 mg tablets 1-2 nightly as needed insomnia started nortriptyline  75 mg nightly for about 3 weeks and DT level 176, reduced to 50 mg HS early November. pramipexole  increased to 0.75 mg twice daily off label for treatment resistant depression  No SE .  Satisfied with meds. Depression is better in the last week.  Not sure why that is the case.  Still walking daily and that helps and journaling 3 pages daily.  Working on Biomedical scientist.  No changes desire.  Clearly benefits from Spravato  but not 100%.  Still considering TMS.  12/10/22 appt noted: Received Spravato  84 mg today as scheduled.  Tolerated it well without nausea or vomiting headache or chest pain or palpitations.  Expected dissociation gradually resolved over the 2 hour observation period. She feels 50% less depressed with Spravto and wants to continue it.   Continues meds Adderall XR 20 mg every morning, Wellbutrin  XL 450 every morning, clonidine  0.2 mg twice daily, lorazepam  1 mg every 8 hours as needed.  Trazodone  50 mg tablets 1-2 nightly as needed insomnia started nortriptyline  75 mg nightly for about 3 weeks and DT level 176, reduced to 50 mg HS early November. pramipexole  increased to 0.75 mg twice daily off label for treatment resistant depression  No SE .   Depression was worse this week for no apparent reason.  She struggled being positive.  She has felt more discouraged.  She has felt more anxious.  She has had a hard time doing tasks.  She is interested in retrying the Auvelity  which we had discussed previously. Plan: She agrees to  retry Auvelity .  To improve tolerability and reduce risk of side effects, Stop Wellbutrin  and start Auvelity  1 in the morning for 1 week then 1 twice daily AndReduce pramipexole  0.5 mg BID and reduce nortriptyline  to 50 mg HS  12/17/22 appt noted: Received Spravato  84 mg today as scheduled.  Tolerated it well without nausea or vomiting headache or chest pain or palpitations.  Expected dissociation gradually resolved over the 2 hour observation period. She feels 50% less depressed with Spravto and wants to continue it.   Continues meds Adderall XR 20 mg every morning, stopped Wellbutrin  XL 150 every morning, clonidine  0.2 mg twice daily, lorazepam  1 mg every 8 hours as  needed.  Trazodone  50 mg tablets 1-2 nightly as needed insomnia Has started nortriptyline  75 mg nightly for about 3 weeks and DT level 176, reduced to 50 mg HS early November. pramipexole  0.375 mg twice daily off label for treatment resistant depression with plan to stop Started Auvelity  1 AM Still depressed to moderate degree.  Tolerating Auvelity  so far.  No other problems with meds.  Willing to give Auvelity  a chance. Plan: She agrees to retry Auvelity .  Continue 1 AM and when tolerated then increase to BID Stop pramipexole .  12/26/22 received Spravato   01/01/23 appt noted: Received Spravato  84 mg today as scheduled.  Tolerated it well without nausea or vomiting headache or chest pain or palpitations.  Expected dissociation gradually resolved over the 2 hour observation period. She feels 50% less depressed with Spravto and wants to continue it.   Continues meds Adderall XR 20 mg every morning, stopped Wellbutrin  XL 150 every morning, clonidine  0.2 mg twice daily, lorazepam  1 mg every 8 hours as needed.  Trazodone  50 mg tablets 1-2 nightly as needed insomnia Has started nortriptyline  75 mg nightly for about 3 weeks and DT level 176, reduced to 50 mg HS early November. Forgot to reduce pramipexole  stoll taking  0.5 mg twice daily off  label for treatment resistant depression  Started Auvelity  1 AM & PM last week. Occ misses Spravato  DT htn.this week a better than last.   Painting again more and it helps mood.  Confidence still low and not as likely to socialize as normal but enjoys family.   Still ambivalent about TMS. Tolerating meds.  Including the increase in Auvelity .    01/08/23 appt noted: Received Spravato  84 mg today as scheduled.  Tolerated it well without nausea or vomiting headache or chest pain or palpitations.  Expected dissociation gradually resolved over the 2 hour observation period. She feels 50% less depressed with Spravto and wants to continue it.   Continues meds Adderall XR 20 mg every morning, stopped Wellbutrin  XL 150 every morning, clonidine  0.2 mg twice daily, lorazepam  1 mg every 8 hours as needed.  Trazodone  50 mg tablets 1-2 nightly as needed insomnia Has started nortriptyline  75 mg nightly for about 3 weeks and DT level 176, reduced to 50 mg HS early November. reduced pramipexole  to 0.5 mg daily off label for treatment resistant depression  Started Auvelity  1 AM & PM mid Feb. More depressed as week progresses.  Thinks it is worse with less pramipexole .  More negative.  Able to function but feels miserable.  No SI.  Hopeless.  Sleep ok.  Tolerating meds.   Talked to [facility] about TMS and appt mid March. Plan: increase pramipexole  to 0.5 mg TID for dep bc seemed to worsen with the reduction.  01/15/23 appt noted: Received Spravato  84 mg today as scheduled.  Tolerated it well without nausea or vomiting headache or chest pain or palpitations.  Expected dissociation gradually resolved over the 2 hour observation period. She feels 50% less depressed with Spravto and wants to continue it.   Continues meds Adderall XR 20 mg every morning, stopped Wellbutrin  XL 150 every morning, clonidine  0.2 mg twice daily, lorazepam  1 mg every 8 hours as needed.  Trazodone  50 mg tablets 1-2 nightly as needed  insomnia Has started nortriptyline  75 mg nightly for about 3 weeks and DT level 176, reduced to 50 mg HS early November. Increased pramipexole  to 0.5 mg  to TID  off label for treatment resistant depression  Started Auvelity  1  AM & PM mid Feb. markedly better depression the increase in pramipexole .  She feels like her depression is almost resolved.  She is very pleased with the response.  She is tolerating the medications well  01/30/23 appt noted: Received Spravato  84 mg today as scheduled.  Tolerated it well without nausea or vomiting headache or chest pain or palpitations.  Expected dissociation gradually resolved over the 2 hour observation period. She feels 50% less depressed with Spravto and wants to continue it.   Continues meds Adderall XR 20 mg every morning, stopped Wellbutrin  XL 150 every morning, clonidine  0.2 mg twice daily, lorazepam  1 mg every 8 hours as needed.  Trazodone  50 mg tablets 1-2 nightly as needed insomnia Has started nortriptyline  75 mg nightly for about 3 weeks and DT level 176, reduced to 50 mg HS early November. Increased pramipexole  to 0.5 mg  to TID  off label for treatment resistant depression  Started Auvelity  1 AM & PM mid Feb. Mood markedly better with pramipexole  added.  Nearly normal mood now with minimal depression.  More social and outgoing and motivated and resolved anhedonia. No SE.  Compliant.  02/04/23 appt noted: Continues meds Adderall XR 20 mg every morning, stopped Wellbutrin  XL 150 every morning, clonidine  0.2 mg twice daily, lorazepam  1 mg every 8 hours as needed.  Trazodone  50 mg tablets 1-2 nightly as needed insomnia Has started nortriptyline  75 mg nightly for about 3 weeks and DT level 176, reduced to 50 mg HS early November. Increased pramipexole  to 0.5 mg  to TID  off label for treatment resistant depression  Started Auvelity  1 AM & PM mid Feb. Received Spravato  84 mg today as scheduled.  Tolerated it well without nausea or vomiting headache  or chest pain or palpitations.  Expected dissociation gradually resolved over the 2 hour observation period. She feels 50% less depressed with Spravto and wants to continue it.  The pramipexole  got her the rest of the way better.  Doesn't want to cut back on any tx bc afraid of relapse. Tolerating meds.  02/15/23 appt noted: Continues meds Adderall XR 20 mg every morning, stopped Wellbutrin  XL 150 every morning, clonidine  0.2 mg twice daily, lorazepam  1 mg every 8 hours as needed.  Trazodone  50 mg tablets 1-2 nightly as needed insomnia Has started nortriptyline  75 mg nightly for about 3 weeks and DT level 176, reduced to 50 mg HS early November. Increased pramipexole  to 0.5 mg  to TID  off label for treatment resistant depression  Started Auvelity  1 AM & PM mid Feb. Received Spravato  84 mg today as scheduled.  Tolerated it well without nausea or vomiting headache or chest pain or palpitations.  Expected dissociation gradually resolved over the 2 hour observation period. She feels no longer depressed.   The pramipexole  got her the rest of the way better.  Doesn't want to cut back on any tx bc afraid of relapse. Tolerating meds.  02/18/23 appt noted: Continues meds Adderall XR 20 mg every morning, stopped Wellbutrin  XL 150 every morning, clonidine  0.2 mg twice daily, lorazepam  1 mg every 8 hours as needed.  Trazodone  50 mg tablets 1-2 nightly as needed insomnia Has started nortriptyline  75 mg nightly for about 3 weeks and DT level 176, reduced to 50 mg HS early November. Increased pramipexole  to 0.5 mg  to TID  off label for treatment resistant depression  Started Auvelity  1 AM & PM mid Feb. Received Spravato  84 mg today as scheduled.  Tolerated it well  without nausea or vomiting headache or chest pain or palpitations.  Expected dissociation gradually resolved over the 2 hour observation period. She feels no longer depressed except when ran out of Auvelity  this week DT lack of availability.  Much worse  without it..   The pramipexole  got her the rest of the way better.  Doesn't want to cut back on any tx bc afraid of relapse. Tolerating meds.  02/25/23 appt: Received Spravato  84 mg today as scheduled.  Tolerated it well without nausea or vomiting headache or chest pain or palpitations.  Expected dissociation gradually resolved over the 2 hour observation period. Continues meds Adderall XR 20 mg every morning, stopped Wellbutrin  XL 150 every morning, clonidine  0.2 mg twice daily, lorazepam  1 mg every 8 hours as needed.  Trazodone  50 mg tablets 1-2 nightly as needed insomnia Has started nortriptyline  75 mg nightly for about 3 weeks and DT level 176, reduced to 50 mg HS early November. Increased pramipexole  to 0.5 mg  to TID  off label for treatment resistant depression  Started Auvelity  1 AM & PM mid Feb. No SE Still dramatically better with meds and Spravato .  Not 100% over depression. Had GS born last week and able to enjoy it now.  Is a little sleepy with pramipexole  but manageable.  No change desired.  Sleep is ok.  03/04/23 appt noted: Received Spravato  84 mg today as scheduled.  Tolerated it well without nausea or vomiting headache or chest pain or palpitations.  Expected dissociation gradually resolved over the 2 hour observation period. Continues meds Adderall XR 20 mg every morning, stopped Wellbutrin  XL 150 every morning, clonidine  0.2 mg twice daily, lorazepam  1 mg every 8 hours as needed.  Trazodone  50 mg tablets 1-2 nightly as needed insomnia nortriptyline  reduced to 50 mg HS early November. Increased pramipexole  to 0.5 mg  to TID  off label for treatment resistant depression  Started Auvelity  1 AM & PM mid Feb. Still generally doing well with meds and Spravato  except when got tooth abscess.  Freels more depressed after dental surgery.   No SE with meds.   Sleep is OK.  No med changes desired  03/11/23 appt noted: Received Spravato  84 mg today as scheduled.  Tolerated it well  without nausea or vomiting headache or chest pain or palpitations.  Expected dissociation gradually resolved over the 2 hour observation period. Continues meds Adderall XR 20 mg every morning, stopped Wellbutrin  XL 150 every morning, clonidine  0.2 mg twice daily, lorazepam  1 mg every 8 hours as needed.  Trazodone  50 mg tablets 1-2 nightly as needed insomnia nortriptyline  reduced to 50 mg HS early November. Increased pramipexole  to 0.5 mg  to TID  off label for treatment resistant depression  Started Auvelity  1 AM & PM mid Feb. Some increase depression with infection and needing amoxiciliin this week.  Otherwie still doing well with mood and meds. No med changes desired or indicated. No SE  03/21/2023 appointment noted: Continues meds Adderall XR 20 mg every morning, stopped Wellbutrin  XL 150 every morning, clonidine  0.2 mg twice daily, lorazepam  1 mg every 8 hours as needed.  Trazodone  50 mg tablets 1-2 nightly as needed insomnia nortriptyline  reduced to 50 mg HS early November. Increased pramipexole  to 0.5 mg  to TID  off label for treatment resistant depression  Started Auvelity  1 AM & PM mid Feb. Received Spravato  84 mg today as scheduled.  Tolerated it well without nausea or vomiting headache or chest pain or palpitations.  Expected  dissociation gradually resolved over the 2 hour observation period. Depression is much better.  Pretty much resolved this week.  She feels the Auvelity  and pramipexole  have made the biggest difference.  Obviously the Spravato  is also helping significantly.  She wants to continue all of these medications.  She still has anxiety easily.  Especially facing something that she would rather avoid.  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 Tolerating meds without significant side effects. Plan no med changes  03/29/23 appt noted: Continues meds Adderall XR 20 mg every morning, stopped  Wellbutrin  XL 150 every morning, clonidine  0.2 mg twice daily, lorazepam  1 mg every 8 hours as needed.  Trazodone  50 mg tablets 1-2 nightly as needed insomnia, nortriptyline  50 mg HS,  pramipexole  0.5 mg  to TID  off label for treatment resistant depression  Started Auvelity  1 AM & PM mid Feb 2024 No SE Received Spravato  84 mg today as scheduled.  Tolerated it well without nausea or vomiting headache or chest pain or palpitations.  Expected dissociation gradually resolved over the 2 hour observation period. Depression continues to improve to a point of very mild sx.  Still hasn't gotten her confidence fully back.  She does enjoy social interactions and is productive at work. She is tolerating the meds well without side effects.  We discussed the possibility of reducing some of the medication given the marked response which was positive with pramipexole .  04/04/23 appt noted: Continues meds Adderall XR 20 mg every morning, stopped Wellbutrin  XL 150 every morning, clonidine  0.2 mg twice daily, lorazepam  1 mg every 8 hours as needed.  Trazodone  50 mg tablets 1-2 nightly as needed insomnia, nortriptyline  50 mg HS,  pramipexole  0.5 mg  to TID  off label for treatment resistant depression  Started Auvelity  1 AM & PM mid Feb 2024 No SE Received Spravato  84 mg today as scheduled.  Tolerated it well without nausea or vomiting headache or chest pain or palpitations.  Expected dissociation gradually resolved over the 2 hour observation period. Dep is still much improved with increase in pramipexole .  Largely resolved.  Still gets anxious but takes less lorazepam  prn but still needs it.  Sleep ok.  Tolerating meds.  04/10/23 appt noted: Continues meds Adderall XR 20 mg every morning, clonidine  0.2 mg twice daily, lorazepam  1 mg every 8 hours as needed.  Trazodone  50 mg tablets 1-2 nightly as needed insomnia, nortriptyline  50 mg HS,  pramipexole  0.5 mg  to TID  off label for treatment resistant depression  Started  Auvelity  1 AM & PM mid Feb 2024 No SE.  Satisfied with meds.   Received Spravato  84 mg today as scheduled.  Tolerated it well without nausea or vomiting headache or chest pain or palpitations.  Expected dissociation gradually resolved over the 2 hour observation period. Dep is still much improved with increase in pramipexole .  Largely resolved.  Still gets anxious but takes less lorazepam  prn but still needs it.  Sleep ok.  Tolerating meds. Did an art show in another town; I couldn't have done this a year ago.    05/07/23 appt noted: Continues meds Adderall XR 20 mg every morning, clonidine  0.2 mg twice daily, lorazepam  1 mg every 8 hours as needed.  Trazodone  50 mg tablets 1-2 nightly as needed insomnia, nortriptyline  25-50 mg HS,  pramipexole  0.5 mg  to TID  off label for treatment resistant depression  Started Auvelity  1 AM & PM mid Feb 2024 No SE.  Satisfied with meds.   Received Spravato  84 mg today as scheduled.  Tolerated it well without nausea or vomiting headache or chest pain or palpitations.  Expected dissociation gradually resolved over the 2 hour observation period. H says she is her old self.  Dep so much better.  Painting going well.  Anxiety in AM and then better.  Wakes with it.  Structured days more.  Negative self talk gone.  Wonderful to have relief.  Good weekend at a wedding.  Enjoys GS; Wolfie.  More outspoken since the dep but not over the top.   More interest overall and satisfied wth resp.  Agrees to wean nortriptyline  but doesn't want other med changes.  05/15/23 received Spravato  84  05/27/23 appt noted: Continues meds Adderall XR 20 mg every morning, clonidine  0.2 mg twice daily, lorazepam  1 mg every 8 hours as needed.  Trazodone  50 mg tablets 1-2 nightly as needed insomnia, pramipexole  0.5 mg  to TID  off label for treatment resistant depression  Started Auvelity  1 AM & PM mid Feb 2024 No SE.  Satisfied with meds.   Received Spravato  84 mg today as scheduled.  Tolerated  it well without nausea or vomiting headache or chest pain or palpitations.  Expected dissociation gradually resolved over the 2 hour observation period. Maybe a little more anxious re: weaning off nortriptyline .  Busy summer.  Extremely prolific painting.   Has a pending show and stressed over preparing.   Enjoys [employer].  Kept him yesterday for a couple of hours.   Dep has been like a cloak with physical sensation for a long time but is much better than it was.  Still not 100% all the time.  Struggles with some chronic guilt issues.  Self talk and CBT on herself yesterday had some. Still some fear about travelling and demands but so much better.   Making people laugh again and socially engaged and H agrees so much better. M has been impossible.  WC and immobile.  She wastes money.   Plan no med changes  06/03/23 appt noted: Meds as above.  No SE.  Satisfied with meds.   Received Spravato  84 mg today as scheduled.  Tolerated it well without nausea or vomiting headache or chest pain or palpitations.  Expected dissociation gradually resolved over the 2 hour observation period. Overall she feels better with Spravato .  She is minimally depressed generally when she is alone and not active but she is staying active socially and with her painting.  This is really helped with her depression and she is having more success.  She still has residual anxiety which she feels she needs to take lorazepam  during the day.  She is attempting to minimize it but without much success.  She wants to continue the Spravato  because of the dramatic improvement she is seen with this and the medications.  06/11/23 appt noted: Meds as above.  No SE.  Satisfied with meds.   Received Spravato  84 mg today as scheduled.  Tolerated it well without nausea or vomiting headache or chest pain or palpitations.  Expected dissociation gradually resolved over the 2 hour observation period. Still has some anxiety wihtout reason an benefit  lorazepam  and usually only 1 mg daily.  Makes her sleepy if takes it in the day. Sees sig benefit with pramipexole  0.5 mg TID and consistent with it. Mood is still good overall.  But bc mother still  can worry about getting older.  Feels too scared about it but working on it. No concerns with meds.    06/20/23 appt noted:  Continues meds Adderall XR 20 mg every morning, clonidine  0.2 mg twice daily, lorazepam  1 mg every 8 hours as needed.  Trazodone  50 mg tablets 1-2 nightly as needed insomnia, pramipexole  0.5 mg  to TID  off label for treatment resistant depression  Started Auvelity  1 AM & PM mid Feb 2024 No SE.  Satisfied with meds.  Needs each as written. Received Spravato  84 mg today as scheduled.  Tolerated it well without nausea or vomiting headache or chest pain or palpitations.  Expected dissociation gradually resolved over the 2 hour observation period. Marked benefit Spravato  and meds remain.  No manic sx.  Residual anxiety is gradually improving.  Sleep is ok. Some residual anxiey at time.  Enjoys GS and family and functioning fine. Plan no med changes  06/25/23 appt noted: Continues meds Adderall XR 20 mg every morning, clonidine  0.2 mg twice daily, lorazepam  1 mg every 8 hours as needed.  Trazodone  50 mg tablets 1-2 nightly as needed insomnia, pramipexole  0.5 mg  to TID  off label for treatment resistant depression  Started Auvelity  1 AM & PM mid Feb 2024 No SE.  Satisfied with meds.  Needs each as written. Received Spravato  84 mg today as scheduled.  Tolerated it well without nausea or vomiting headache or chest pain or palpitations.  Expected dissociation gradually resolved over the 2 hour observation period. Marked benefit Spravato  and meds remain.  Minimal depression at this time. No manic sx.  Residual anxiety is gradually improving and she is using much less lorazepam .  Sleep is ok. Some residual anxiey at time.  Enjoys GS and family and functioning fine. Plan no med  changes  07/04/23 appt noted: Continues meds Adderall XR 20 mg every morning, clonidine  0.2 mg twice daily, lorazepam  1 mg every 8 hours as needed.  Trazodone  50 mg tablets 1-2 nightly as needed insomnia, pramipexole  0.5 mg  to TID  off label for treatment resistant depression  Started Auvelity  1 AM & PM mid Feb 2024 No SE.  Satisfied with meds.  Needs each as written. Received Spravato  84 mg today as scheduled.  Tolerated it well without nausea or vomiting headache or chest pain or palpitations.  Expected dissociation gradually resolved over the 2 hour observation period.  Good experience today. Dealing with stress Of having to place her into a nursing facility.  However she has handled it well.  Patient has been able to take over responsibilities in the family that she could not have done prior to Spravato  and current meds.  Her depression is in remission.  She she is not highly anxious usually but still needs lorazepam  in the evening.  No problems or concerns with meds.  ADD is well managed with the Adderall.  She is productive and enjoying life including working family.  07/11/23 appt noted: Continues meds Adderall XR 20 mg every morning, clonidine  0.2 mg twice daily, lorazepam  1 mg every 8 hours as needed.  Trazodone  50 mg tablets 1-2 nightly as needed insomnia, pramipexole  0.5 mg  to TID  off label for treatment resistant depression  Started Auvelity  1 AM & PM mid Feb 2024 No SE.  Satisfied with meds.  Needs each as written. Received Spravato  84 mg today as scheduled.  Tolerated it well without nausea or vomiting headache or chest pain or palpitations.  Expected dissociation gradually resolved  over the 2 hour observation period.  Good experience today. Depression remained under control with remission.  She is able to enjoy things normally that she could not enjoy last year.  She is able to handle stressors that she could not handle last year such as dealing with her mother's declining health and  the family issues that that brings up.  That has created more anxiety and she needs the lorazepam  lately.  She had been avoiding it for the most part until these issues.  No changes desired.  09/02/23 appt noted:  no Spravato  Misses Auvelity  if doesn't take it but $450/month.  Will look into cost solutions.  On MCR now. Garrel Retired.  Helping with grandchild. Done art shows out of town.   Couldn't have done it a year ago. Chronic disorganization.   H retired.  Big lifestyle change.  H hanging out with .  10/24/23 appt noted: Continues meds Adderall XR 20 mg every morning, clonidine  0.2 mg twice daily, lorazepam  1 mg every 8 hours as needed, not used lately.  Trazodone  50 mg tablets 1-2 nightly as needed insomnia, pramipexole  0.5 mg  to TID  off label for treatment resistant depression  Started Auvelity  1 AM & PM mid Feb 2024.  Not taking bc cost with insurance >$400 Overall doing pretty well.  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  Also dealing with high maintenance mother in asst living.  Sister single and hard to deal with somewhat.   Still doing well overall with depression.  Anxiey is ok.   Benefit with ADD med.   Dep is still under control.  Seems very removed from her now.  Function is very good.   Enjoys GS 7 mos No issues with meds.   07/08/24 appt noted:  2 NS since here Med: Adderall XR 20 mg every morning, clonidine  0.2 mg twice daily, lorazepam  1 mg every 8 hours as needed, not used lately.  Trazodone  50 mg tablets 1-2 nightly as needed insomnia, pramipexole  0.5 mg  to TID  off label for treatment resistant depression  Started Auvelity  1 AM & PM mid Feb 2024.  Not taking bc cost with insurance >$400 Rarelorazepam .   D getting married.  Really busy.  Doing fine.  Stress up a lot.  BP fine.  Not sleeping quite as well.    Giving engagement party at house so really busy.  Getting house ready for this in Sept.   Busy with art too.   Garrel retired is good.   Loves the pramipexpole.   Kids doing well.   Walking 5 times weekly. Working daily keeps anxiety better.  Never worked this consistently.   No manic sx.  Managing social life well.   ECT-MADRS    Flowsheet Row Clinical Support from 07/11/2023 in [facility] Clinical Support from 01/08/2023 in [facility] Clinical Support from 08/06/2022 in [facility] Clinical Support from 07/04/2022 in [facility] Clinical Support from 05/21/2022 in [facility]  MADRS Total Score 9 21 29 15 27    Past Psychiatric Medication Trials: fluoxetine , duloxetine, Viibryd, Pristiq, sertraline, citalopram,  Trintellix anxious and SI Wellbutrin  XL 450 Auvelity  1 dose nortriptyline  75 mg nightly for about 3 weeks and DT level 176, reduced to 50 mg HS NR. Pramipexole  1 mg NR Adderall, Adderall XR, Vyvanse, Ritalin, Strattera low dose NR Lorazepam  Trazodone   Depakote,  lamotrigine cog complaints Lithium remotely Abilify   7.5  Vraylar 1.5 mg daily agitation and insomnia Rexulti insomnia Latuda  40 one dose, CO anxious and SI Seroquel  XR 300 Olanzapine  10  At visit November 12, 2019. We discussed Patient developed an increasingly severe alcohol dependence problem since her last visit in January.  She went to [facility] and has had no alcohol abuse since .  She never abused stimulants but they took her off the stimulants at [facility].  Her ADD was markedly worse.  The Wellbutrin  did not help the ADD.   D history lamotrigine rash at 68 yo  Review of Systems:  Review of Systems  Musculoskeletal:  Positive for back pain. Negative for arthralgias.       SP hip surgery October 2020  Neurological:  Negative for tremors.  Psychiatric/Behavioral:  Negative for agitation, behavioral problems, confusion, decreased concentration, dysphoric mood, hallucinations, self-injury, sleep disturbance and  suicidal ideas. The patient is nervous/anxious. The patient is not hyperactive.        Forgetful at times about med recommendations but better now    Medications: I have reviewed the patient's current medications.  Current Outpatient Medications  Medication Sig Dispense Refill   amLODipine (NORVASC) 2.5 MG tablet Take 2.5 mg by mouth daily.     amphetamine -dextroamphetamine (ADDERALL XR) 20 MG 24 hr capsule Take 1 capsule (20 mg total) by mouth every morning. 30 capsule 0   iron  polysaccharides (NIFEREX) 150 MG capsule TAKE 1 CAPSULE BY MOUTH EVERY DAY 90 capsule 1   losartan (COZAAR) 50 MG tablet Take 50 mg by mouth daily.     nebivolol (BYSTOLIC) 2.5 MG tablet Take 2.5 mg by mouth daily.     traZODone  (DESYREL ) 50 MG tablet TAKE 1-2 TABLETS BY MOUTH NIGHTLY AS NEEDED FOR SLEEP. 60 tablet 0   amphetamine -dextroamphetamine (ADDERALL XR) 20 MG 24 hr capsule Take 1 capsule (20 mg total) by mouth every morning. 90 capsule 0   buPROPion  ER (WELLBUTRIN  SR) 100 MG 12 hr tablet Take 1 tablet (100 mg total) by mouth 2 (two) times daily. 180 tablet 1   cloNIDine  (CATAPRES ) 0.2 MG tablet TAKE 1 TABLET BY MOUTH TWICE A DAY (Patient not taking: Reported on 07/08/2024) 60 tablet 0   LORazepam  (ATIVAN ) 1 MG tablet Take 1 tablet (1 mg total) by mouth every 8 (eight) hours as needed. for anxiety 45 tablet 0   pramipexole  (MIRAPEX ) 0.5 MG tablet Take 1 tablet (0.5 mg total) by mouth 3 (three) times daily. 270 tablet 1   No current facility-administered medications for this visit.    Medication Side Effects: None  Allergies:  Allergies  Allergen Reactions   Metronidazole Shortness Of Breath and Other (See Comments)    Heart pounding   Ferrlecit  [Na Ferric Gluc Cplx In Sucrose] Other (See Comments)    Infusion reaction 05/12/2019    Past Medical History:  Diagnosis Date   ADHD    Anemia    Anxiety    Arthritis    Depression    Heart murmur    i went to see a cardiologit slast eyar  and i had  zero plaque,    PONV (postoperative nausea and vomiting)    Recovering alcoholic in remission (HCC)     Family History  Problem Relation Age of Onset   Atrial fibrillation Mother    CAD Father     Past Medical History, Surgical history, Social history, and Family history were reviewed and updated as appropriate.   Please see review of systems for further  details on the patient's review from today.   Objective:   Physical Exam:  There were no vitals taken for this visit.  Physical Exam Constitutional:      General: She is not in acute distress. Neurological:     Mental Status: She is alert and oriented to person, place, and time.     Coordination: Coordination normal.     Gait: Gait normal.  Psychiatric:        Attention and Perception: Attention and perception normal.        Mood and Affect: Mood is not anxious or depressed. Affect is not tearful.        Speech: Speech is not rapid and pressured.        Behavior: Behavior is not hyperactive.        Thought Content: Thought content is not paranoid or delusional. Thought content does not include homicidal or suicidal ideation. Thought content does not include suicidal plan.        Cognition and Memory: Cognition normal. Memory is not impaired.        Judgment: Judgment normal.     Comments: Insight intact. No auditory or visual hallucinations. No delusions.  No depression at this time and very positive affect and expressive normally for her. No manic sx     Lab Review:     Component Value Date/Time   NA 137 01/12/2021 1430   NA 140 11/18/2018 1544   K 3.8 01/12/2021 1430   CL 108 01/12/2021 1430   CO2 22 01/12/2021 1430   GLUCOSE 94 01/12/2021 1430   BUN 14 01/12/2021 1430   BUN 20 11/18/2018 1544   CREATININE 0.82 01/12/2021 1430   CALCIUM 8.9 01/12/2021 1430   PROT 6.6 01/12/2021 1430   ALBUMIN 3.9 01/12/2021 1430   AST 12 (L) 01/12/2021 1430   ALT 11 01/12/2021 1430   ALKPHOS 46 01/12/2021 1430   BILITOT  0.5 01/12/2021 1430   GFRNONAA >60 01/12/2021 1430   GFRAA >60 09/02/2019 0249   GFRAA >60 01/27/2019 0811       Component Value Date/Time   WBC 4.5 01/12/2021 1430   RBC 4.32 01/12/2021 1430   HGB 12.8 01/12/2021 1430   HGB 12.9 07/17/2019 0953   HCT 38.5 01/12/2021 1430   HCT 21.9 (L) 12/25/2018 1221   PLT 272 01/12/2021 1430   PLT 286 07/17/2019 0953   MCV 89.1 01/12/2021 1430   MCH 29.6 01/12/2021 1430   MCHC 33.2 01/12/2021 1430   RDW 12.4 01/12/2021 1430   LYMPHSABS 1.4 01/12/2021 1430   MONOABS 0.4 01/12/2021 1430   EOSABS 0.0 01/12/2021 1430   BASOSABS 0.0 01/12/2021 1430    No results found for: POCLITH, LITHIUM   No results found for: PHENYTOIN, PHENOBARB, VALPROATE, CBMZ   .res Assessment: Plan:    Recurrent major depression resistant to treatment (HCC) - Plan: pramipexole  (MIRAPEX ) 0.5 MG tablet, buPROPion  ER (WELLBUTRIN  SR) 100 MG 12 hr tablet  Attention deficit hyperactivity disorder (ADHD), predominantly inattentive type - Plan: amphetamine -dextroamphetamine (ADDERALL XR) 20 MG 24 hr capsule  Generalized anxiety disorder - Plan: LORazepam  (ATIVAN ) 1 MG tablet  Insomnia due to mental condition - Plan: LORazepam  (ATIVAN ) 1 MG tablet   She has treatment resistant major depression  with 100% better with Spravato  and meds. She has remained in remission after stopping Spravato  and increasing pramipexole  0.5 mg 3 times daily.  She had also stopped Auvelity  because insurance would not pay for it.  We discussed the risk  of relapse but quite apparently her depression is in remission due to the pramipexole .  She is not taking any unusual dose of Wellbutrin  and Wellbutrin  failed to pull her out of the depression before so it is clearly the pramipexole  is making a difference.  Depression resolved recently after increasing pramipexole  0.5 mg TID, brief relapse when out of Auvelity  Consider switch to ER to reduce sleepiness if needed but better.   has failed  all major categories of antidepressants MAO inhibitors which have not been tried.  Worse with less pramipexole . Increased to 0.5 mg TID on about end of Feb 2024 and markedly better Dosing range off label for depression ranges from 1-5 mg daily.  Disc risk compulsive impulsive behavior and mania.    Started Spravato  84 mg twice weekly on 03/16/2022.  Stopped bc MCR cost is too high.  So far is ok but not perfect.  Confidence is  back now.  Adderall  XR 20 mg AM  Has not taken much Ativan  in the last few months.  Using it rarely now.  Option weaning clonidine  but she needs to watch her blood pressure because it has been high in the past.  Otherwise continue clonidine  0.2 mg BID off label for anxiety and helps BP partially. BP is better controlled and more consistent.Rec keep track of BP and discuss with PCP. It has been up and down.  Sometimes needs extra clonidine  before Spravato .  Better lately.  Discussed potential benefits, risks, and side effects of stimulants with patient to include increased heart rate, palpitations, insomnia, increased anxiety, increased irritability, or decreased appetite.  Instructed patient to contact office if experiencing any significant tolerability issues.  She continues walkly daily with benefit. And journaling.   Has Maintained sobriety.    Continue Adderall XR 20 mg every morning  continue clonidine  0.2 mg twice daily for anxiety and blood pressure. Continue lorazepam  1 mg 3 times daily s as needed but only uses rarely now. Continue pramipexole  0.5 mg 3 times daily off label for dep; effective  Continue trazodone  50 mg tablets 1-2 nightly as needed for sleep.  FU 8weeks  Lorene Macintosh, MD, DFAPA     Please see After Visit Summary for patient specific instructions.  Future Appointments  Date Time Provider Department Center  10/22/2024 11:00 AM Cottle, Lorene KANDICE Raddle., MD [facility] None                          No orders of the defined  types were placed in this encounter.      -------------------------------

## 2024-08-07 ENCOUNTER — Telehealth: Payer: Self-pay | Admitting: Psychiatry

## 2024-08-07 ENCOUNTER — Other Ambulatory Visit: Payer: Self-pay

## 2024-08-07 DIAGNOSIS — F900 Attention-deficit hyperactivity disorder, predominantly inattentive type: Secondary | ICD-10-CM

## 2024-08-07 MED ORDER — AMPHETAMINE-DEXTROAMPHET ER 20 MG PO CP24: 20.0000 mg | ORAL_CAPSULE | ORAL | 0 refills | Status: DC

## 2024-08-07 MED ORDER — AMPHETAMINE-DEXTROAMPHET ER 20 MG PO CP24: 20.0000 mg | ORAL_CAPSULE | ORAL | 0 refills | Status: AC

## 2024-08-07 NOTE — Telephone Encounter (Signed)
 Pt lvm requesting Rx for Adderall XR 20 mg [facility]  [id number]

## 2024-08-07 NOTE — Telephone Encounter (Signed)
 Pended

## 2024-10-16 ENCOUNTER — Telehealth: Payer: Self-pay | Admitting: Psychiatry

## 2024-10-16 NOTE — Telephone Encounter (Signed)
 Pt states she needs a rf of Adderall     [facility] [id number]

## 2024-10-16 NOTE — Telephone Encounter (Signed)
 Pt has a RF at the pharmacy but they don't have medication in stock. Pharmacist checked surrounding pharmacies and none had enough in stock to fill Rx. Patient notified.

## 2024-10-19 ENCOUNTER — Telehealth: Payer: Self-pay | Admitting: Psychiatry

## 2024-10-19 ENCOUNTER — Other Ambulatory Visit: Payer: Self-pay

## 2024-10-19 DIAGNOSIS — F5105 Insomnia due to other mental disorder: Secondary | ICD-10-CM

## 2024-10-19 DIAGNOSIS — F411 Generalized anxiety disorder: Secondary | ICD-10-CM

## 2024-10-19 MED ORDER — LORAZEPAM 1 MG PO TABS: 1.0000 mg | ORAL_TABLET | Freq: Three times a day (TID) | ORAL | 0 refills | Status: AC | PRN

## 2024-10-19 NOTE — Telephone Encounter (Signed)
Pended lorazepam

## 2024-10-19 NOTE — Telephone Encounter (Signed)
 Pt LVM at 1:09p asking stating she can't find Adderall.  I called her back and told her to call the [facility] or [facility].  She then said she wanted the Lorazepam  sent.  From what I can tell she should have a script available and it doesn't show expired..  She said because she hasn't taken it in a while, she thinks they need a call from us .  [facility] confirmed [facility].  Next appt 12/11

## 2024-10-22 ENCOUNTER — Encounter: Payer: Self-pay | Admitting: Psychiatry

## 2024-10-22 ENCOUNTER — Ambulatory Visit: Admitting: Psychiatry

## 2024-10-22 DIAGNOSIS — F411 Generalized anxiety disorder: Secondary | ICD-10-CM

## 2024-10-22 DIAGNOSIS — F900 Attention-deficit hyperactivity disorder, predominantly inattentive type: Secondary | ICD-10-CM

## 2024-10-22 DIAGNOSIS — F5105 Insomnia due to other mental disorder: Secondary | ICD-10-CM | POA: Diagnosis not present

## 2024-10-22 DIAGNOSIS — F339 Major depressive disorder, recurrent, unspecified: Secondary | ICD-10-CM

## 2024-10-22 MED ORDER — AMPHETAMINE-DEXTROAMPHET ER 20 MG PO CP24: 20.0000 mg | ORAL_CAPSULE | ORAL | 0 refills | Status: AC

## 2024-10-22 MED ORDER — PRAMIPEXOLE DIHYDROCHLORIDE 1 MG PO TABS: 1.0000 mg | ORAL_TABLET | Freq: Two times a day (BID) | ORAL | 0 refills | Status: AC

## 2024-10-22 MED ORDER — BUPROPION HCL ER (SR) 100 MG PO TB12: 100.0000 mg | ORAL_TABLET | Freq: Two times a day (BID) | ORAL | 1 refills | Status: AC

## 2024-10-22 NOTE — Progress Notes (Unsigned)
 Laura Chang [id number] August 04, 1956 68 y.o.  Subjective:   Patient ID:  Laura Chang is a 68 y.o. (DOB 28-Jun-1956) female.  Chief Complaint:  Chief Complaint  Patient presents with   Follow-up   Depression   ADD      Laura Chang presents to the office today for follow-up of depression and anxiety and ADD.  seen November 12, 2019.  Melted down in 2020.  Went to [facility] in July.  No withdrawal.  1 drink since then.  [employer], [employer].  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 She was on no stimulant and no SSRI but was taking Strattera and Wellbutrin .  The following changes were made. Stop Strattera. OK restart stimulant bc severe ADD Restart Adderall 1 daily for a few days and if tolerated then restart 1 twice daily. If not tolerated reduce the dosage if needed. May need to stop Wellbutrin  if not tolerating the stimulant.  Yes.  DC Wellbutrin  Restart Prozac  20 mg daily.  February 2021 appointment with the following noted: Completed grant proposal.  Couldn't doit without Adderall.  Sold a bunch of work.   Adderall XR lasts about 3 pm.  Strength seems about right.  BP been OK.  Not jittery.   Stopped Wellbutrin  but had no SE. Mood drastically better with grant proposal and back on fluoxetine .  Less depressed and lethargic.  No anxiety.  Cut back on coffee. Started back with devotions and stronger faith. Plan: Continue Prozac  20 mg daily. May have to increase the dose at some point in the future given that she usually was taking higher dosages but she is getting good response at this time. Restart Wellbutrin  off label for ADD since can't get 2 ADDERALL daily. 150 mg daily then 300 mg daily. She can adjust the dose between 150 mg and 300 mg daily to get the optimal effect.   05/11/2020 appointment with the following noted: Has been inconsistent with Prozac  and Wellbutrin . Not sure of the effect of Wellbutrin . Biggest deterrent in work is anxiety.  Some of the  work is conceptual and difficult at times.  Can feel she's not up to a project at times.  Overall is OK but would like a steadier benefit from stimulant.  Exhausted from managing concentration and keeping up with things from the day.  Loses things.  Not good keeping up with schedule. Overall productive and emotionally OK. Can feel Adderall wear off. Mood is better in summer and worse in the winter.   F died in 09/29/24 and that is a loss. No SE Wellbutrin . Still attends AA meetings.  Real benefit from [name] last year. Recognizes effect of anemia on ADD and mood.  Had iron  infusions last winter. Plan:  Wellbutrin  off label for ADD since can't get 2 ADDERALL daily. 150 mg daily then 300 mg daily.  01/24/2021 appointment with following noted: Doing a program called [employer] mindfulness app since Xmas.  CBT app helped the depression.  App helped her focus better.  Lost sign weight. Writing a lot. Before Xmas felt depressed and started negative thinking worse, self denigrating. Not drinking. More isolated.   Recognizes mo is narcissist.    Didn't tell anyone she was born until 3 mos later.  M aloof and uninterested in pt.  Lied about her birthday.  Mo lack of affection even with pt's kids. Going to AA for a year and it helped her to quit drinking. Also misses kids being gone with a hole  also. Plan: No med changes  05/04/2021 appointment with the following noted: Therapist [name] thinks she's manic. Lost weight to 144#.   States she is still sleeping okay.  Admits she is hyper and recognizes that she is likely manic.  She feels great, euphoric with an increased sense of spiritual connectedness to God.  She has racing thoughts and talks fast and talks a lot and this is noted by her husband.  He thinks she is a bit hyper.  She has been able to maintain sobriety although she will have 1 glass of wine on special occasions but does not drink by herself.  She is not drinking to excess.  She  denies any dangerous impulsivity.  She is clearly not depressed and not particularly anxious.  She has no concerns about her medication and she has been compliant.  06/16/21 appt noted: So much better.  Going through a lot but the manic thing happened on top of it.  So much slower.  Didn't feel like losing anything with risperidone .  Likes the Adderalll at 10 mg. Some drowsiness in the AM and very drowsy from risperidone  2 mg HS. Prayer life is better. Handling stress better. Less depressed with risperidone . Still likes trazodone . Sleeps well. Plan: Reduce Prozac  to 10 mg daily.  Consider stopping it because it can feel the mania however she is reluctant to do that because she fears relapse of depression. Reduce risperidone  to 1.5 mg nightly due to side effects.  Discussed risk of worsening mania.  07/25/2021 appointment with the following noted: Misses the Adderall and hard to function without it. Depressed now. Heavy chest.  Anxious and guilty.  Body feels heavy.   Hates Wellbutrin .   Plan: Increase fluoxetine  to 20 mg daily Add Abilify  1/2 of 15 mg tablet daily Wean wellbutrin  by 1 tablet each week  bc she feels it is not helpful and DT polypharmacy Reduce risperidone  to 1 daily for 1 week and stop it. Disc risk of mania. Increase Adderall to XR 20 mg AM  08/08/21 Much less depressed and starting to feel normal I feel a lot better. No SE.  Speech normal off risperidone . Sleeping OK on trazaodone and enough.   Noticed benefit from Adderall again. Plan: continue fluoxetine  to 20 mg daily Continue Abilify  1/2 of 15 mg tablet daily for depression and mania continue Adderall to XR 20 mg AM  10/10/2021 phone call: Pt stated she feels like the Abilify  should be decreased to 5mg .She said she is depressed but rational and not suicidal.She has an appt Monday and can wait until then if you prefer. MD response: Reduce the Abilify  to 7.5 mg every other day.  We will meet on 10/16/2021 and decide what  to do from there.  10/16/2021 appointment with the following noted: More depressed.  Most depressed I've ever been.  Just numb.  Sense of grief.   Thinks the manic episode was unlike anything else she ever had.  Doesn't want to medicate against it.  Don't enjoy people.  Easily overwhelmed.  Had some death thoughts but not suicidal.  Has been functional.  Feels better today after reducing Abilify  to every other day but she is only been doing that for 3 days. A/P: Episode of post manic depression was explained. continue fluoxetine  to 20 mg daily Hold Abilify  for 1 week then resume Abilify  1/2 of 15 mg tablet every other day for depression and mania continue Adderall to XR 20 mg AM  10/27/2021 appointment with the  following noted: I'm doing so much better.  Handling the depression better. Better self talk and spiritual focus has helped.   Dep 6/10 manifesting as anxiety with low confidence.   F died 2  years ago and M 68 yo and is dependent . She is working hard to feel better but still feels depressed.  She almost feels like she has a little more anxiety since restarting Abilify  every other day. Plan: continue fluoxetine  to 20 mg daily DC Abilify  .  Vrayalar 1.5 mg QOD to try to get rid of depression ASAP. continue Adderall to XR 20 mg AM  11/10/2021 appointment with the following noted: Busy with Xmas and it was fun with family but then a big let down.  Did well with it.  Functioned well with it.  Working hard on things with depression.  Not shutting down. Not sure but feels better today but yesterday was hard.  Difficulty dealing with mother.  She won't do anything to help herself.  Yesterday with her all day.  Won't do PT and has isolated herself.    Lack of confidence.   No SE with Vraylar.  11/24/21 urgent appointment appt noted: More and more depressed.   So anxious and doesn't want to be alone but can do so. No appetite. Hurts inside. Has had some fleeting suicidal thoughts but would  not act on them.  Tolerating meds. Has been consistent with Vraylar 1.5 mg every other day, fluoxetine  20 mg daily Plan: Increase Vraylar to 1.5 mg daily Change Prozac  to Trintellix 10 mg daily. Discussed side effects of each continue Adderall to XR 20 mg AM  12/27/2021 appointment with the following noted: Not OK.  I feel less depressed but feels bat shit. Not sleeping well.  Extremely anxious. Off and on sleep. 3-4 hours of sleep.   Still having daily SI.  But also become obvious has so much to do.  Overwhelmed by tasks.   Needs anxiety meds to just function. Not more motivated.  Walked yesterday.   Feels afraid like in trouble but not irritable or angry. DC DT agitation Vraylar to 1.5 mg daily Change Prozac  to Trintellix 10 mg daily. Hold Adderall to XR 20 mg AM Clonidine  0.1 1/2 tablet twice daily for 2 days and if needed for anxiety and sleep increase to 1 twice daily Ok temporary Ativan  1 mg 3 times daily as needed anxiety  01/05/22 appt noted: Off fluoxetine  and  Trintellix.  Only on Ativan , trazodone  and Adderall XR 20 plus added clonidine  0.1 mg BID Didn't think she needed to start Trintellix. Not taking Ativan .   Didn't like herself last week. Feels some better today. Wonders if the manic sx Not agitated.  Anxiety kind of calmed down.  A lot to be anxious about situationally.  $ stress. Concerns about downers with meds. Can't access normal personality. ? Lethargy and inability to talk as sE. Plan: Latuda  20-40 mg daily with food. Adderall to XR 20 mg AM Clonidine  0.1 1/2 tablet twice daily  reduce dose to be sure no SE Ok temporary Ativan  1 mg 3 times daily as needed anxiety  01/19/22 appt noted: Taking Latuda  20 mg daily.  Took 40 mg once and felt anxious and  SI Still depressed and not very reactive Anxiety mainly about the depression and fears of the future. She wants to revisit manic sx and thinks it was maybe bc taking delta 8 bc was taking a lot of it so still  doesn't think she's  classic bipolar. She wants to only take Prozac  bc thinks Latuda  is perpetuating depression. Says the delta 8 was very psychaedelic.  When not taking it was not manic.  Sleeping ok again.  Plan: Per her request DC Latuda  20-40 mg daily with food. She wants to continue Prozac  alone AMA  Adderall to XR 20 mg AM Clonidine  0.1 1/2 tablet twice daily  reduce dose to be sure no SE Ok temporary Ativan  1 mg 3 times daily as needed anxiety  01/23/2022 phone call complaining of increased anxiety since stopping Latuda .  She will try increasing clonidine .  01/26/2022 phone call not feeling well and wanted to restart the Vraylar.  However notes indicate that had made her agitated therefore she was encouraged to pick up samples of Rexulti 1 mg and start that instead.  02/06/2022 phone call: Stating she felt the Rexulti was helping with depression but she was not sleeping well and obsessing over things.  She was encouraged to increase Rexulti to 2 mg daily and increase trazodone  for sleep.  02/09/2022 appointment with the following noted: This was an urgent work in appointment No sleep last night with trazodone  100 mg HS Nothing really better depression or anxiety. Ruminating negative anxious thoughts. Did not tolerate Rexulti because it was causing insomnia.  Does not think it helped depression.  Lacks emotion that she should have.  Lacks her usual personality.  Some hopeless thoughts.  Some death thoughts.  Some suicidal thoughts without plan or intent Plan: DC Rexulti and Prozac  & DC trazodone  Adderall to XR 20 mg AM Clonidine  0.1 1/tablet twice daily  reduce dose to be sure no SE Ok temporary Ativan  1 mg 3 times daily as needed anxiety Start Seroquel  XR 150 mg nightly  03/02/2022 appointment: Karna called back a few days after starting Seroquel  stating it was making her more anxious and more depressed.  This seemed unlikely as this medicine rarely ever causes anxiety.  She stopped the  medication waited 3 days and called back still had anxiety and depression but thought perhaps the anxiety was a little better.  She did not want to take the Seroquel . She knew about the option of Spravato  and wanted to pursue that. Now questions whether to return to Seroquel  while waiting to start Spravato  bc feels just as bad without it and knows she didn't give it enough time to work.   MADRS 46  ECT-MADRS    Flowsheet Row Clinical Support from 07/11/2023 in [facility] Clinical Support from 01/08/2023 in [facility] Clinical Support from 08/06/2022 in [facility] Clinical Support from 07/04/2022 in [facility] Clinical Support from 05/21/2022 in [facility]  MADRS Total Score 9 21 29 15 27    03/14/22 appt noted: Pt received Spravato  56 mg first dose today with some dissociative sx which were not severe.  She was anxious prior to the administration and felt better after receiving lorazepam  1 mg.  No NV, or HA. Wants to continue Spravato . Ongoing depression and desperate to feel better.  I'm not myself DT deprsssion which is most severe in recent history.  Anhedonia.  Low motivation.  Social avoidance. Continues to think all recent med trials are making her worse.  Sleep ok with Seroquel .  03/16/22 appt noted: Received Spravato  84 mg for the first time.  some dissociative sx which were not severe.  She was anxious prior to the administration and felt better after  receiving lorazepam  1 mg.  No NV, or HA. Wants to continue Spravato .   Does not feel any better or different since the last appt.  Ongoing depression.  Ongoing depression and desperate to feel better.  I'm not myself DT deprsssion which is most severe in recent history.  Anhedonia.  Low motivation.  Social avoidance. Continues to think all recent med trials are making her worse.  Sleep ok with Seroquel .   Does not want to continue Seroquel  for TRD.  03/20/2022 appointment noted: Came for Spravato  administration today.  However blood pressure was significantly elevated approximately 180/115.  She was given lorazepam  1 mg and clonidine  0.2 mg to try to get it down. She states she regretted stopping the Seroquel  XR 300 mg tablets.  She now realizes it was helpful.  She did not sleep much at all last night.  She did not take the Adderall this morning. 2 to 3 hours after arrival blood pressure was still elevated at  170/110, 62 pulse.  For Spravato  administration was canceled for today.  She admits to being anxious and depressed.  She is not suicidal.  She is highly motivated to receive the Spravato .  We discussed getting it tomorrow.  03/22/2022 appointment noted: Patient's blood pressure was never stable enough yesterday in order to get her in for Spravato  administration.  She was encouraged to see her primary care doctor.  It is better today.  03/26/2022 appointment with the following noted: Blood pressure was better.  Saw her primary care doctor who started on oral Bystolic 2.5 mg daily. Received Spravato  84 mg today as scheduled.  Tolerated it well without nausea or vomiting headache or chest pain or palpitations.  Her blood pressure was borderline but manageable. She remains depressed and anxious.  She is ambivalent about the medicine and desperate to get to feel better.  Continues to have anhedonia and low energy and low motivation and reduced ability to do things.  Less social.  Not suicidal.  03/28/22 appt noted: Received Spravato  84 mg today as scheduled.  Tolerated it well without nausea or vomiting headache or chest pain or palpitations.  Her blood pressure was borderline but manageable. Has not seen any improvement so far.  Tolerating Seroquel .  Inconsistent with Bystolic and BP has been borderline high. Still depressed and anxious and anhedonia.  Low motivation, energy, productivity. Taking  quetiapine  and tolerating XR 300 mg nightly.  04/04/22 appt noted: Received Spravato  84 mg today as scheduled.  Tolerated it well without nausea or vomiting headache or chest pain or palpitations.  Her blood pressure was borderline but manageable. Has not seen any improvement so far.  Tolerating Seroquel .   She still tends to think that the medications are making her worse.  She has said this about each of the recent psychiatric medicines including Seroquel .  However her husband thinks she is improved.  She also admits there is some improvement in productivity.  She still feels highly anxious.  She still does not enjoy things as normal.  She still feels desperate to improve as soon as possible. Has been taking Seroquel  XR since 03/20/2022  04/10/22 appt noted: Received Spravato  84 mg today as scheduled.  Tolerated it well without nausea or vomiting headache or chest pain or palpitations.  Her blood pressure was borderline but manageable. Has not seen any improvement so far.  Tolerating Seroquel .  Doesn't like Seroquel  bc she thinks it flattens here. Ongoing depression without confidence Plan: Start Auvelity  1 every morning for persistent  treatment resistant depression  04/12/2022 appointment with the following noted: Received Spravato  84 mg today as scheduled.  Tolerated it well without nausea or vomiting headache or chest pain or palpitations.  Her blood pressure was borderline but manageable. Has not seen any improvement so far.  Tolerating Seroquel .  Doesn't like Seroquel  bc she thinks it flattens her. Received Spravato  84 mg today as scheduled.  Tolerated it well without nausea or vomiting headache or chest pain or palpitations.  Her blood pressure was borderline but manageable. Has not seen any improvement so far.  Tolerating Seroquel .  Doesn't like Seroquel  bc she thinks it flattens here.  We discussed her ambivalence about it. She is starting Auvelity  and has tolerated it the last 2 days  without side effect.  She still does not feel like herself and feels flat and not enjoying things with suppressed expressed emotion  04/17/2022 appointment with the following noted: Received Spravato  84 mg today as scheduled.  Tolerated it well without nausea or vomiting headache or chest pain or palpitations.  Her blood pressure was borderline but manageable. Has not seen any improvement so far.  Tolerating Seroquel .  Doesn't like Seroquel  bc she thinks it flattens her. She has been tolerating the Auvelity  1 in the morning without side effects for about a week.  She has not noticed significant improvement so far.  She still feels depressed and flat and not herself.  Other people notice that she is flat emotionally.  She is not suicidal.  She does feel discouraged that she is not getting better yet.  04/19/2022 appointment noted: Has increased Auvelity  to 1 twice daily for 2 days, continues quetiapine  XR 300 mg nightly, clonidine  0.3 mg twice daily, lorazepam  1 mg twice daily for anxiety and Adderall XR 20 mg in the morning. No obious SE but she still thinks quetiapine  XR is making her feel down.  But not sedated Received Spravato  84 mg today as scheduled.  Tolerated it well without nausea or vomiting headache or chest pain or palpitations.  Her blood pressure was borderline but manageable. She still feels quite anxious and feels it necessary to take both the clonidine  and lorazepam  twice a day to manage her anxiety.  She has been consistently down and flat and not herself until yesterday afternoon she noted an improvement in mood and feeling much more like herself with her normal personality reemerging.  She was quite depressed in the morning with very dark negative thoughts.  She did not have those dark negative thoughts this morning.  She had a lot of questions about medication and when she was expecting to be improved and why she has not shown improvement up to now.  04/23/22 appt noted: Has increased  Auvelity  to 1 twice daily for 1 week, continues quetiapine  XR 300 mg nightly, clonidine  0.3 mg twice daily, lorazepam  1 mg twice daily for anxiety and Adderall XR 20 mg in the morning. No obious SE but she still thinks quetiapine  XR is making her feel down.  But not sedated Received Spravato  84 mg today as scheduled.  Tolerated it well without nausea or vomiting headache or chest pain or palpitations.  She is still depressed but admits better function and is able to enjoy social interactions. Tolerating meds.  Would like to feel better for sure. Not herself.  Flat. Plan increase Auvelity  to 1 tab BID as planned and reduce Quetiapine  to 1/2 of ER 300 mg  bc NR for depression.  04/25/2022 appointment with the following noted:  clonidine  0.3 mg twice daily, lorazepam  1 mg twice daily for anxiety and Adderall XR 20 mg in the morning. Seroquel  XR 300 HS No obious SE but she still thinks quetiapine  XR is making her feel down.  But not sedated Received Spravato  84 mg today as scheduled.  Tolerated it well without nausea or vomiting headache or chest pain or palpitations.  Called yesterday with more anxiety.  Had increased Auvelity  for 1 day and reduced Seroquel  XR for 1 day.  Felt restless and fearful  05/01/2022 appointment noted: clonidine  0.3 mg twice daily, lorazepam  1 mg twice daily for anxiety and Adderall XR 20 mg in the morning. Seroquel  XR 150 HS, Auvelity  1 BID Received Spravato  84 mg today as scheduled.  Tolerated it well without nausea or vomiting headache or chest pain or palpitations.  Nurse has noted patient has called multiple times sometimes asking the same question repeatedly.  It is unclear whether she is truly forgetful or is just anxious seeking reassurance. Patient acknowledges ongoing depression as well as some anxiety but states she has felt a little better in the last couple of days.  She has reduced the Seroquel  to 150 mg at night and has increased Auvelity  to 1 twice daily but only  for 1 day.  So far she seems to be tolerating it.  05/03/22 appt noted: clonidine  0.2 mg twice daily, lorazepam  1 mg twice daily for anxiety and Adderall XR 20 mg in the morning. Seroquel  XR 150 HS, Auvelity  1 BID BP high this am about 170/100 and received extra clonidine  0.2 mg and came to receive Spravato .  Not dizzy, no SOB, nor CP but BP is still high Could not receive Spravato  today bc BP high and pulse low at 30 ppm. Still depressed and anxious. Plan: continue trial Auvelity  with Spravato  She needs to get BP and pulse managed  05/08/22 TC: RTC  H Michael NA and mailbox full.  Could not leave message.  Pt  -  talked to she and H on speaker. H worried over wife.  Vacant stare.  Slurs words at times.  Not smiling. Reduced enjoyment.  Depression.  Withdrawn from usual activities.  Some irritability.  Anxious. Disc her concerns meds are making her worse.  Extensive discussion about her treatment resistant status.  There is a consistent pattern of not taking the medicines long enough to get benefit because she believes the meds are making her worse.  However the symptoms she describes as side effects are exactly the same symptoms that she had prior to taking the medication RX for  the depression.  So it is not clear that these are actual side effects. This is true about the 2 most recent meds including Seroquel  and Auvelity .  Recommend psychiatric consultation in hopes of improving her comfort level with taking prescribed medications for a sufficient length of time to provide benefit. Extensive discussion about ECT is the treatment of choice for treatment resistant depression.  Spravato  may work if she can comply with consistency.  There are medication options but they take longer to work.   Plan:  Reduce clonidine  to 0.1 mg BID DT bradycardia.  Talk with PCP about BP and low pulse problems which are interfering with her consistent compliance with Spravato .   Limit lorazepam  to 3 -4  mg daily max.  Excess use is the cause of slurring speech.  She must stop excess use or will have to stop the med. Stop Auvelity  per her request.  But she has  only been on the full dose for a little over a week and clearly has not had time to get benefit from it.  She thinks maybe it is making her more anxious. Reduce Seroquel  from 150XR to 50 -100 mg at night IR.  She couldn't sleep when stopped it completely. Will not start new antidepressant until her SE issues are resolved or not. Get second psych opinion from Medford Croak MD or another psychiatrist.  H's sister is therapist in Roselie Lorene Macintosh, MD, [facility]  05/16/2022 appointment with the following noted: Received Spravato  84 mg today as scheduled.  Tolerated it well without nausea or vomiting headache or chest pain or palpitations.  She stopped Auvelity  as discussed last week. On her own, without physician input, she restarted Wellbutrin  XL 450 mg every morning today.  She had taken it in the past.  She feels jittery and anxious. She feels less depressed than she did last week.  But she is still depressed without her usual range of affect.  She still is less social and less motivated than normal. Her primary care doctor increased the dose of losartan Plan: Stop Seroquel  Reduce Wellbutrin  XL to 300 mg every morning.  Starting the dose at 450 every morning is likely causing side effects of jitteriness and it should not be started at that have a dosage. Recommend she not change meds on her own without MDM put  05/23/2022 appointment with the following noted: Received Spravato  84 mg today as scheduled.  Tolerated it well without nausea or vomiting headache or chest pain or palpitations.  Has not dropped seroquel  XR 300 mg 1/2 tablet nightly bc couldn't sleep without it. Has not tried lower dose quetiapine  50 mg HS Still feels depressed.   BP is better managed so far, just saw PCP.  BP is better today and infact is low today. Dropped clonidine  as directed  from 0.3 mg BID bc inadequate control of BP to 0.2 mg BID.  However she wants to increase it back to 0.3 mg twice daily because she feels it helped her anxiety better.  Wonders about increasing Wellbutrin  for depression.  However she has only been on 300 mg a day for a week.  She was on 450 mg daily in the past.  06/06/22 appt noted: Received Spravato  84 mg today as scheduled.  Tolerated it well without nausea or vomiting headache or chest pain or palpitations.  She is still depressed and anxious.  She wants to try to stop the Seroquel  but cannot sleep without some of it.  She is taking lorazepam  1 mg 4 times daily and still having a lot of anxiety.  She wants to increase clonidine  back to 0.3 mg twice daily.  She hopes for more improvement She recently went for a second psychiatric opinion as suggested the results of that are pending.  06/11/22 appt noted: Received Spravato  84 mg today as scheduled.  Tolerated it well without nausea or vomiting headache or chest pain or palpitations.  She is still depressed and anxious. Without much change.  Still hopeless, anhedonia, reduced inteterest and motivation.  Tolerating meds. Disc concerns Spravato  is not hleping much. Plan: stop Seroquel  and start olanzapine  10 mg HS for TRD and anxiety.  06/13/2022 appointment noted: Received Spravato  84 mg today as scheduled.  Tolerated it well without nausea or vomiting headache or chest pain or palpitations.  She is still depressed and anxious. Without much change.  Still hopeless, anhedonia, reduced inteterest and motivation.  Tolerating meds. Disc  concerns Spravato  is not helping much as hoped but is improving a bit in the last week. Tolerating meds. Continues Wellbutrin  XL 450 AM, tolerating recently started olanzapine   10 mg HS. Sleep is good.   Pending appt with TMS consult.  06/18/22 appt noted: Received Spravato  84 mg today as scheduled.  Tolerated it well without nausea or vomiting headache or chest pain or  palpitations.  Tolerating meds. Continues Wellbutrin  XL 450 AM, tolerating recently started olanzapine   10 mg HS. Continues Adderall XR 20 amd and has tried to reduce lorazepam  to 1mg  TID Sleep is good.   Pending appt with TMS consult. Depression is a little bit better in the last week with a little improvement in emotional expression and interest.  She is pushing herself to be more active.  Her daughter thought she was a little better than she has been.  However she is still depressed and still not her normal self with anhedonia and reduced emotional expressiveness.  06/20/22 appt noted: Received Spravato  84 mg today as scheduled.  Tolerated it well without nausea or vomiting headache or chest pain or palpitations.  Tolerating meds with a little sleepiness. Continues Wellbutrin  XL 450 AM, tolerating recently started olanzapine   10 mg HS. Continues Adderall XR 20 amd and has tried to reduce lorazepam  to 1mg  TID Sleep is good.   Mood is improving.  Better funciton.  Anxiety is better with olanzapine . Still not herself and depression not gone with some anhedonia and social avoidance and feeling overwhelmed.  8/14 2023 received Spravato  84 mg 06/27/2022 received Spravato  Spravato  84 mg 07/02/2022 received Spravato  84 mg 07/04/2022 received Spravato  84 mg  07/09/2022 appointment noted: Received Spravato  84 mg today as scheduled.  Tolerated it well without nausea or vomiting headache or chest pain or palpitations.  Expected dissociation and feels less depressed with resolution of negative emotions immediately after Spravato  and then depression, anxiety creep back in. Continues meds Adderall XR 20 mg every morning, Wellbutrin  XL 450 every morning, clonidine  0.1 mg twice daily, lorazepam  1 mg every 6 hours as needed, olanzapine  increased from 7.5 to 10 mg nightly on  Tolerating meds.  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  She does note some increased appetite and is somewhat concerned about that but has not gained significant amounts of weight. She has had the TMS consultation which was initially denied but she knows it can be appealed.  However because she is improving with Spravato  plus the other medications now she wants to continue the current treatment plan.  07/18/22 appt noted: Continues meds Adderall XR 20 mg every morning, Wellbutrin  XL 450 every morning, clonidine  0.1 mg twice daily, lorazepam  1 mg every 6 hours as needed, olanzapine  increased from 7.5 to 10 mg nightly on 07/04/2022. Received Spravato  84 mg today as scheduled.  Tolerated it well without nausea or vomiting headache or chest pain or palpitations.  Expected dissociation and feels less depressed with resolution of negative emotions immediately after Spravato  and then depression, anxiety creep back in. Continues meds Adderall XR 20 mg every morning, Wellbutrin  XL 450 every morning, clonidine  0.1 mg twice daily, lorazepam  1 mg every 6 hours as needed, olanzapine  increased from 7.5 to 10 mg nightly on  Tolerating meds.  She notes she is clearly improved with regard to depression and anxiety since the switch from Seroquel  to olanzapine  10 mg nightly for treatment  resistant depression.  She does note some increased appetite and is somewhat concerned about that but has not gained significant amounts of weight. She has had the TMS consultation which was initially denied but she knows it can be appealed. She continues to have chronic ambivalence about psychiatric medicines and initially tends to blame her depressive symptoms such as decreased concentration and feeling flat on what ever medicine she currently is taking even though she had the same symptoms before the current medicines were started.  Then after discussion she does admit that her depressive symptoms are improved since adding olanzapine  but still has  those residual symptoms noted.  07/23/22 received Spravato  84 mg   07/30/2022 appointment noted: Received Spravato  84 mg today as scheduled.  Tolerated it well without nausea or vomiting headache or chest pain or palpitations.  Expected dissociation and feels less depressed with resolution of negative emotions immediately after Spravato  and then depression, anxiety creep back in. Continues meds Adderall XR 20 mg every morning, Wellbutrin  XL 450 every morning, clonidine  0.1 mg twice daily, lorazepam  1 mg every 6 hours as needed, olanzapine  increased from 7.5 to 10 mg nightly on  She has been inconsistent with olanzapine  because she continues to be ambivalent about the medications in general and thinks that perhaps the 10 mg is making her feel blunted.  She continues to feel some depression.  She had a good day this week and but still feels somewhat depressed and persistently anxious. Plan: be consistent with olanzapine  10 mg HS for TRD and longer trial for potential benefit for anxiety.  Has not taken it consistently.  08/06/2022 appointment noted: Received Spravato  84 mg today as scheduled.  Tolerated it well without nausea or vomiting headache or chest pain or palpitations.  Expected dissociation and feels less depressed with resolution of negative emotions immediately after Spravato  and then depression, anxiety creep back in. Continues meds Adderall XR 20 mg every morning, Wellbutrin  XL 450 every morning, clonidine  0.1 mg twice daily, lorazepam  1 mg every 6 hours as needed, olanzapine  i 10 mg nightly  She continues to feel depressed but is about 50% better with Spravato .  She is still not herself.  She still has anhedonia.  She still is not her able to engage socially in the typical ways.  She is not jovial and outgoing like normal.  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  She continues  to feel that her personality is dampened down.  There is a question about whether it is related to depression or medication. Plan: continue olanzapine  10 for longer trial for TRD and severe anxiety.  08/13/22 appt noted:  Received Spravato  84 mg today as scheduled.  Tolerated it well without nausea or vomiting headache or chest pain or palpitations.  Expected dissociation and feels less depressed with resolution of negative emotions immediately after Spravato  and then depression, anxiety creep back in. Continues meds Adderall XR 20 mg every morning, Wellbutrin  XL 450 every morning, clonidine  0.1 mg twice daily, lorazepam  1 mg every 6 hours as needed, olanzapine  i 10 mg nightly  She still does not feel herself.  Still struggles with depression and low motivation and reduced social engagement and reduced interest and reduced emotional expression.  She is somewhat better with the medicines plus Spravato .  She still believes the Spravato  makes her blunted and is not sure how much it  helps her anxiety.  She can have good days when her family is around and she is engaged.  She still wants to stop the olanzapine . She has apparently continued to take the trazodone  despite having been told to stop it when she started olanzapine .  She feels like she needs the trazodone . Plan: DC olanzapine  and Start nortriptyline  25 mg nightly and build up to 75 mg nightly and then check blood level.    08/27/2022 appointment noted: Received Spravato  84 mg today as scheduled.  Tolerated it well without nausea or vomiting headache or chest pain or palpitations.  Expected dissociation and feels less depressed with resolution of negative emotions immediately after Spravato  and then depression, anxiety creep back in. Continues meds Adderall XR 20 mg every morning, Wellbutrin  XL 450 every morning, clonidine  0.1 mg twice daily, lorazepam  1 mg every 6 hours as needed. Stopped olanzapine  and started nortriptyline  which she has taken for  about a week is 75 mg nightly. So far she is tolerating the nortriptyline  well with the exception of some dry mouth and constipation which she is working to manage.  She does not feel substantially better better or different off the olanzapine .  No change in her sleep which is good.  Main concern currently in addition to the residual depression is anxiety which is somewhat situational with pending arch show.  She is worrying about it more than normal.  Says she is having to take lorazepam  twice a day where she had been able to keep reduce it prior to this.  She still does not feel like herself with residual depression with less social interest and less of her usual buoyancy in personality.  She is flatter than normal.  Overall she still feels that the Spravato  has been helpful at reducing the severity of the depression.  She is not suicidal. She has not heard anything about the TMS appeal as of yet.  09/05/2022 appointment noted: Received Spravato  84 mg today as scheduled.  Tolerated it well without nausea or vomiting headache or chest pain or palpitations.  Expected dissociation and feels less depressed with resolution of negative emotions immediately after Spravato  and then depression, anxiety creep back in. Continues meds Adderall XR 20 mg every morning, Wellbutrin  XL 450 every morning, clonidine  0.1 mg twice daily, lorazepam  1 mg every 6 hours as needed. Stopped olanzapine  and started nortriptyline  which she has taken for about 2 week is 75 mg nightly. Initially blood pressure was a little high causing delay in starting Spravato .  She admitted to feeling a little wound up.  She still experiences a little increase in depression if she goes longer than a week in between doses of Spravato .  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  09/10/22 appt noted: Received Spravato  84 mg today as scheduled.   Tolerated it well without nausea or vomiting headache or chest pain or palpitations.  Expected dissociation gradually resolved over the 2 hour observation period. She feels 50% less depressed with Spravto and wants to continue it.   Continues meds Adderall XR 20 mg every morning, Wellbutrin  XL 450 every morning, clonidine  0.1 mg twice daily, lorazepam  1 mg every 6 hours as needed. Has started nortriptyline  75 mg nightly for about 3 weeks. Has not seen a significant difference with the addition of nortriptyline .  Tolerating it pretty well. She continues to have some degree of anhedonia  and significant depression and anxiety.  Her daughters noticed that she is more needy and calls more frequently.  She acknowledges this as well.  She is clearly still not herself. Plan: pramipexole  off label and RX 0.25 mg BID  09/17/2022 appointment noted: Received Spravato  84 mg today as scheduled.  Tolerated it well without nausea or vomiting headache or chest pain or palpitations.  Expected dissociation gradually resolved over the 2 hour observation period. She feels 50% less depressed with Spravto and wants to continue it.   Continues meds Adderall XR 20 mg every morning, Wellbutrin  XL 450 every morning, clonidine  0.1 mg twice daily, lorazepam  1 mg every 6 hours as needed. Has started nortriptyline  75 mg nightly for about 3 weeks and DT level 176, reduced to 50 mg HS early November. Still the same sx as noted last visit.  Tolerating meds.   Compliant.  Still depressed and family notices.  Has been able to participate in family interactions.  Some post-show let down and has to do detailed work which is hard for her bc ADD.  Sleep and eating well.  Energy OK but not great.  No SI.  Not cried in a year or so.  Clearly less depressed and hopeless than before the Spravato .  09/24/22 appt noted: Received Spravato  84 mg today as scheduled.  Tolerated it well without nausea or vomiting headache or chest pain or  palpitations.  Expected dissociation gradually resolved over the 2 hour observation period. She feels 50% less depressed with Spravto and wants to continue it.   Continues meds Adderall XR 20 mg every morning, Wellbutrin  XL 450 every morning, clonidine  0.1 mg twice daily, lorazepam  1 mg every 6 hours as needed. Has started nortriptyline  75 mg nightly for about 3 weeks and DT level 176, reduced to 50 mg HS early November. Still the same sx as noted last visit.  Tolerating meds.   Compliant.  Still depressed and family notices.  Has been able to participate in family interactions.  Some post-show let down and has to do detailed work which is hard for her bc ADD.  Sleep and eating well.  Energy OK but not great.  No SI.  Not cried in a year or so.  Clearly less depressed and hopeless than before the Spravato . Is not making further progress generally.  Stuck with moderate depression  10/02/22 appt noted: Received Spravato  84 mg today as scheduled.  Tolerated it well without nausea or vomiting headache or chest pain or palpitations.  Expected dissociation gradually resolved over the 2 hour observation period. She feels 50% less depressed with Spravto and wants to continue it.   Continues meds Adderall XR 20 mg every morning, Wellbutrin  XL 450 every morning, clonidine  0.1 mg twice daily, lorazepam  1 mg every 6 hours as needed. Has started nortriptyline  75 mg nightly for about 3 weeks and DT level 176, reduced to 50 mg HS early November. Still the same sx as noted last visit.  Tolerating meds.   Compliant.  Still depressed and family notices.  Has been able to participate in family interactions.  Some post-show let down and has to do detailed work which is hard for her bc ADD.  Sleep and eating well.  Energy OK but not great.  No SI.  Not cried in a year or so.  Clearly less depressed and hopeless than before the Spravato . Is not making further progress generally.  Stuck with moderate depression.  Is able to  function pretty normally. Plan:  trial pramipexole  0.25 mg BID off label for depression.   10/08/22 appt noted: Received Spravato  84 mg today as scheduled.  Tolerated it well without nausea or vomiting headache or chest pain or palpitations.  Expected dissociation gradually resolved over the 2 hour observation period. She feels 50% less depressed with Spravto and wants to continue it.   Continues meds Adderall XR 20 mg every morning, Wellbutrin  XL 450 every morning, clonidine  0.1 mg twice daily, lorazepam  1 mg every 6 hours as needed. Has started nortriptyline  75 mg nightly for about 3 weeks and DT level 176, reduced to 50 mg HS early November. Still the same sx as noted last visit.  Tolerating meds.   Compliant.  Still depressed and family notices.  Has been able to participate in family interactions.  Some post-show let down and has to do detailed work which is hard for her bc ADD.  Sleep and eating well.  Energy OK but not great.  No SI.  Not cried in a year or so.  Clearly less depressed and hopeless than before the Spravato . Is not making further progress generally.  Stuck with moderate depression.  Behaved and felt pretty normally with family over for Thanksgiving. Doesn't see benefit or SE with pramipexole  but thinks maybe it makes her worse. Plan:  increase pramipexole  augmentation off label to 0.5 mg BID  10/15/2022 appointment noted: Received Spravato  84 mg today as scheduled.  Tolerated it well without nausea or vomiting headache or chest pain or palpitations.  Expected dissociation gradually resolved over the 2 hour observation period. She feels 50% less depressed with Spravto and wants to continue it.   Continues meds Adderall XR 20 mg every morning, Wellbutrin  XL 450 every morning, clonidine  0.2 mg twice daily, lorazepam  1 mg every 6 hours as needed.  Trazodone  50 mg tablets 1-2 nightly as needed insomnia Has started nortriptyline  75 mg nightly for about 3 weeks and DT level 176, reduced  to 50 mg HS early November. Recommended increase pramipexole  0.5 mg twice daily off label for treatment resistant depression on 10/08/2022. She feels better motivated more active with pramipexole  0.5 mg twice daily.  She still is depressed but it is better.  We discussed the possibility of going up in the dose but did not change it.  10/22/2022 appointment noted: Received Spravato  84 mg today as scheduled.  Tolerated it well without nausea or vomiting headache or chest pain or palpitations.  Expected dissociation gradually resolved over the 2 hour observation period. She feels 50% less depressed with Spravto and wants to continue it.   Continues meds Adderall XR 20 mg every morning, Wellbutrin  XL 450 every morning, clonidine  0.2 mg twice daily, lorazepam  1 mg every 8 hours as needed.  Trazodone  50 mg tablets 1-2 nightly as needed insomnia Has started nortriptyline  75 mg nightly for about 3 weeks and DT level 176, reduced to 50 mg HS early November. pramipexole  0.5 mg twice daily off label for treatment resistant depression on 10/08/2022. She is better motivated than she was.  She is journaling 3 pages a day.  She has started walking and has walked 5 days a week for 50 minutes for the last 2 weeks.  That is significantly helped her mood.  Her mood tends to be better when she interacts with family.  However she still has some periods of depression.  She is tolerating the medications well.  She still feels like her affect and confidence is not back to normal. Plan:  increase  pramipexole  augmentation off label to 0.5 mg tID or 0.75 mg BID   10/29/22 appt noted: Received Spravato  84 mg today as scheduled.  Tolerated it well without nausea or vomiting headache or chest pain or palpitations.  Expected dissociation gradually resolved over the 2 hour observation period. She feels 50% less depressed with Spravto and wants to continue it.   Continues meds Adderall XR 20 mg every morning, Wellbutrin  XL 450 every  morning, clonidine  0.2 mg twice daily, lorazepam  1 mg every 8 hours as needed.  Trazodone  50 mg tablets 1-2 nightly as needed insomnia Has started nortriptyline  75 mg nightly for about 3 weeks and DT level 176, reduced to 50 mg HS early November. pramipexole  increased to 0.75 mg twice daily off label for treatment resistant depression  She has been feeling somewhat better with the increase in pramipexole  and is tolerating it well.  She still is easily overwhelmed.  Her affect and mood can improve now when around her family or doing something positive.  She has been able to be more productive. She is tolerating the current medications. She is still considering TMS as an alternative to Spravato .  11/15/2022 appointment noted: Received Spravato  84 mg today as scheduled.  Tolerated it well without nausea or vomiting headache or chest pain or palpitations.  Expected dissociation gradually resolved over the 2 hour observation period. She feels 50% less depressed with Spravto and wants to continue it.   Continues meds Adderall XR 20 mg every morning, Wellbutrin  XL 450 every morning, clonidine  0.2 mg twice daily, lorazepam  1 mg every 8 hours as needed.  Trazodone  50 mg tablets 1-2 nightly as needed insomnia Has started nortriptyline  75 mg nightly for about 3 weeks and DT level 176, reduced to 50 mg HS early November. pramipexole  increased to 0.75 mg twice daily off label for treatment resistant depression  He has noticed some increase in depression due to the length of time since the last Spravato  administration.  She believes Spravato  is helping her.  sHe is not suicidal but has felt very blue the last few days. She is tolerating the medication. She is ambivalent about Spravato  versus TMS but she is considering TMS. She wants to continue Spravato  administration because it is clearly helpful.  11/19/22 appt noted: Received Spravato  84 mg today as scheduled.  Tolerated it well without nausea or vomiting  headache or chest pain or palpitations.  Expected dissociation gradually resolved over the 2 hour observation period. She feels 50% less depressed with Spravto and wants to continue it.   Continues meds Adderall XR 20 mg every morning, Wellbutrin  XL 450 every morning, clonidine  0.2 mg twice daily, lorazepam  1 mg every 8 hours as needed.  Trazodone  50 mg tablets 1-2 nightly as needed insomnia Has started nortriptyline  75 mg nightly for about 3 weeks and DT level 176, reduced to 50 mg HS early November. pramipexole  increased to 0.75 mg twice daily off label for treatment resistant depression  She has felt better back on Spravato  more regularly.  However she still has residual depression esp when alone or when wihtout activity.  Can function when needed.   Does not have her connfidence back. She plans to pursue TMS availability. Have discussed retrying Auvelity   11/28/22 appt noted: Received Spravato  84 mg today as scheduled.  Tolerated it well without nausea or vomiting headache or chest pain or palpitations.  Expected dissociation gradually resolved over the 2 hour observation period. She feels 50% less depressed with Spravto and wants to  continue it.   Continues meds Adderall XR 20 mg every morning, Wellbutrin  XL 450 every morning, clonidine  0.2 mg twice daily, lorazepam  1 mg every 8 hours as needed.  Trazodone  50 mg tablets 1-2 nightly as needed insomnia Has started nortriptyline  75 mg nightly for about 3 weeks and DT level 176, reduced to 50 mg HS early November. pramipexole  increased to 0.75 mg twice daily off label for treatment resistant depression  Last week she was more depressed than usual for reasons that are not clear.  She has not had a clear answer from [employer] about [employer] options.  States that they have not returned her call.  She is asked about Auvelity  retry in place of Wellbutrin .  Has still been walking.  12/03/22 appt noted: Received Spravato  84 mg today as scheduled.  Tolerated it  well without nausea or vomiting headache or chest pain or palpitations.  Expected dissociation gradually resolved over the 2 hour observation period. She feels 50% less depressed with Spravto and wants to continue it.   Continues meds Adderall XR 20 mg every morning, Wellbutrin  XL 450 every morning, clonidine  0.2 mg twice daily, lorazepam  1 mg every 8 hours as needed.  Trazodone  50 mg tablets 1-2 nightly as needed insomnia started nortriptyline  75 mg nightly for about 3 weeks and DT level 176, reduced to 50 mg HS early November. pramipexole  increased to 0.75 mg twice daily off label for treatment resistant depression  No SE .  Satisfied with meds. Depression is better in the last week.  Not sure why that is the case.  Still walking daily and that helps and journaling 3 pages daily.  Working on biomedical scientist.  No changes desire.  Clearly benefits from Spravato  but not 100%.  Still considering TMS.  12/10/22 appt noted: Received Spravato  84 mg today as scheduled.  Tolerated it well without nausea or vomiting headache or chest pain or palpitations.  Expected dissociation gradually resolved over the 2 hour observation period. She feels 50% less depressed with Spravto and wants to continue it.   Continues meds Adderall XR 20 mg every morning, Wellbutrin  XL 450 every morning, clonidine  0.2 mg twice daily, lorazepam  1 mg every 8 hours as needed.  Trazodone  50 mg tablets 1-2 nightly as needed insomnia started nortriptyline  75 mg nightly for about 3 weeks and DT level 176, reduced to 50 mg HS early November. pramipexole  increased to 0.75 mg twice daily off label for treatment resistant depression  No SE .   Depression was worse this week for no apparent reason.  She struggled being positive.  She has felt more discouraged.  She has felt more anxious.  She has had a hard time doing tasks.  She is interested in retrying the Auvelity  which we had discussed previously. Plan: She agrees to retry Auvelity .  To improve  tolerability and reduce risk of side effects, Stop Wellbutrin  and start Auvelity  1 in the morning for 1 week then 1 twice daily AndReduce pramipexole  0.5 mg BID and reduce nortriptyline  to 50 mg HS  12/17/22 appt noted: Received Spravato  84 mg today as scheduled.  Tolerated it well without nausea or vomiting headache or chest pain or palpitations.  Expected dissociation gradually resolved over the 2 hour observation period. She feels 50% less depressed with Spravto and wants to continue it.   Continues meds Adderall XR 20 mg every morning, stopped Wellbutrin  XL 150 every morning, clonidine  0.2 mg twice daily, lorazepam  1 mg every 8 hours as needed.  Trazodone   50 mg tablets 1-2 nightly as needed insomnia Has started nortriptyline  75 mg nightly for about 3 weeks and DT level 176, reduced to 50 mg HS early November. pramipexole  0.375 mg twice daily off label for treatment resistant depression with plan to stop Started Auvelity  1 AM Still depressed to moderate degree.  Tolerating Auvelity  so far.  No other problems with meds.  Willing to give Auvelity  a chance. Plan: She agrees to retry Auvelity .  Continue 1 AM and when tolerated then increase to BID Stop pramipexole .  12/26/22 received Spravato   01/01/23 appt noted: Received Spravato  84 mg today as scheduled.  Tolerated it well without nausea or vomiting headache or chest pain or palpitations.  Expected dissociation gradually resolved over the 2 hour observation period. She feels 50% less depressed with Spravto and wants to continue it.   Continues meds Adderall XR 20 mg every morning, stopped Wellbutrin  XL 150 every morning, clonidine  0.2 mg twice daily, lorazepam  1 mg every 8 hours as needed.  Trazodone  50 mg tablets 1-2 nightly as needed insomnia Has started nortriptyline  75 mg nightly for about 3 weeks and DT level 176, reduced to 50 mg HS early November. Forgot to reduce pramipexole  stoll taking  0.5 mg twice daily off label for treatment  resistant depression  Started Auvelity  1 AM & PM last week. Occ misses Spravato  DT htn.this week a better than last.   Painting again more and it helps mood.  Confidence still low and not as likely to socialize as normal but enjoys family.   Still ambivalent about TMS. Tolerating meds.  Including the increase in Auvelity .    01/08/23 appt noted: Received Spravato  84 mg today as scheduled.  Tolerated it well without nausea or vomiting headache or chest pain or palpitations.  Expected dissociation gradually resolved over the 2 hour observation period. She feels 50% less depressed with Spravto and wants to continue it.   Continues meds Adderall XR 20 mg every morning, stopped Wellbutrin  XL 150 every morning, clonidine  0.2 mg twice daily, lorazepam  1 mg every 8 hours as needed.  Trazodone  50 mg tablets 1-2 nightly as needed insomnia Has started nortriptyline  75 mg nightly for about 3 weeks and DT level 176, reduced to 50 mg HS early November. reduced pramipexole  to 0.5 mg daily off label for treatment resistant depression  Started Auvelity  1 AM & PM mid Feb. More depressed as week progresses.  Thinks it is worse with less pramipexole .  More negative.  Able to function but feels miserable.  No SI.  Hopeless.  Sleep ok.  Tolerating meds.   Talked to [facility] about TMS and appt mid March. Plan: increase pramipexole  to 0.5 mg TID for dep bc seemed to worsen with the reduction.  01/15/23 appt noted: Received Spravato  84 mg today as scheduled.  Tolerated it well without nausea or vomiting headache or chest pain or palpitations.  Expected dissociation gradually resolved over the 2 hour observation period. She feels 50% less depressed with Spravto and wants to continue it.   Continues meds Adderall XR 20 mg every morning, stopped Wellbutrin  XL 150 every morning, clonidine  0.2 mg twice daily, lorazepam  1 mg every 8 hours as needed.  Trazodone  50 mg tablets 1-2 nightly as needed insomnia Has started  nortriptyline  75 mg nightly for about 3 weeks and DT level 176, reduced to 50 mg HS early November. Increased pramipexole  to 0.5 mg  to TID  off label for treatment resistant depression  Started Auvelity  1 AM & PM  mid Feb. markedly better depression the increase in pramipexole .  She feels like her depression is almost resolved.  She is very pleased with the response.  She is tolerating the medications well  01/30/23 appt noted: Received Spravato  84 mg today as scheduled.  Tolerated it well without nausea or vomiting headache or chest pain or palpitations.  Expected dissociation gradually resolved over the 2 hour observation period. She feels 50% less depressed with Spravto and wants to continue it.   Continues meds Adderall XR 20 mg every morning, stopped Wellbutrin  XL 150 every morning, clonidine  0.2 mg twice daily, lorazepam  1 mg every 8 hours as needed.  Trazodone  50 mg tablets 1-2 nightly as needed insomnia Has started nortriptyline  75 mg nightly for about 3 weeks and DT level 176, reduced to 50 mg HS early November. Increased pramipexole  to 0.5 mg  to TID  off label for treatment resistant depression  Started Auvelity  1 AM & PM mid Feb. Mood markedly better with pramipexole  added.  Nearly normal mood now with minimal depression.  More social and outgoing and motivated and resolved anhedonia. No SE.  Compliant.  02/04/23 appt noted: Continues meds Adderall XR 20 mg every morning, stopped Wellbutrin  XL 150 every morning, clonidine  0.2 mg twice daily, lorazepam  1 mg every 8 hours as needed.  Trazodone  50 mg tablets 1-2 nightly as needed insomnia Has started nortriptyline  75 mg nightly for about 3 weeks and DT level 176, reduced to 50 mg HS early November. Increased pramipexole  to 0.5 mg  to TID  off label for treatment resistant depression  Started Auvelity  1 AM & PM mid Feb. Received Spravato  84 mg today as scheduled.  Tolerated it well without nausea or vomiting headache or chest pain or  palpitations.  Expected dissociation gradually resolved over the 2 hour observation period. She feels 50% less depressed with Spravto and wants to continue it.  The pramipexole  got her the rest of the way better.  Doesn't want to cut back on any tx bc afraid of relapse. Tolerating meds.  02/15/23 appt noted: Continues meds Adderall XR 20 mg every morning, stopped Wellbutrin  XL 150 every morning, clonidine  0.2 mg twice daily, lorazepam  1 mg every 8 hours as needed.  Trazodone  50 mg tablets 1-2 nightly as needed insomnia Has started nortriptyline  75 mg nightly for about 3 weeks and DT level 176, reduced to 50 mg HS early November. Increased pramipexole  to 0.5 mg  to TID  off label for treatment resistant depression  Started Auvelity  1 AM & PM mid Feb. Received Spravato  84 mg today as scheduled.  Tolerated it well without nausea or vomiting headache or chest pain or palpitations.  Expected dissociation gradually resolved over the 2 hour observation period. She feels no longer depressed.   The pramipexole  got her the rest of the way better.  Doesn't want to cut back on any tx bc afraid of relapse. Tolerating meds.  02/18/23 appt noted: Continues meds Adderall XR 20 mg every morning, stopped Wellbutrin  XL 150 every morning, clonidine  0.2 mg twice daily, lorazepam  1 mg every 8 hours as needed.  Trazodone  50 mg tablets 1-2 nightly as needed insomnia Has started nortriptyline  75 mg nightly for about 3 weeks and DT level 176, reduced to 50 mg HS early November. Increased pramipexole  to 0.5 mg  to TID  off label for treatment resistant depression  Started Auvelity  1 AM & PM mid Feb. Received Spravato  84 mg today as scheduled.  Tolerated it well without nausea or  vomiting headache or chest pain or palpitations.  Expected dissociation gradually resolved over the 2 hour observation period. She feels no longer depressed except when ran out of Auvelity  this week DT lack of availability.  Much worse without it..    The pramipexole  got her the rest of the way better.  Doesn't want to cut back on any tx bc afraid of relapse. Tolerating meds.  02/25/23 appt: Received Spravato  84 mg today as scheduled.  Tolerated it well without nausea or vomiting headache or chest pain or palpitations.  Expected dissociation gradually resolved over the 2 hour observation period. Continues meds Adderall XR 20 mg every morning, stopped Wellbutrin  XL 150 every morning, clonidine  0.2 mg twice daily, lorazepam  1 mg every 8 hours as needed.  Trazodone  50 mg tablets 1-2 nightly as needed insomnia Has started nortriptyline  75 mg nightly for about 3 weeks and DT level 176, reduced to 50 mg HS early November. Increased pramipexole  to 0.5 mg  to TID  off label for treatment resistant depression  Started Auvelity  1 AM & PM mid Feb. No SE Still dramatically better with meds and Spravato .  Not 100% over depression. Had GS born last week and able to enjoy it now.  Is a little sleepy with pramipexole  but manageable.  No change desired.  Sleep is ok.  03/04/23 appt noted: Received Spravato  84 mg today as scheduled.  Tolerated it well without nausea or vomiting headache or chest pain or palpitations.  Expected dissociation gradually resolved over the 2 hour observation period. Continues meds Adderall XR 20 mg every morning, stopped Wellbutrin  XL 150 every morning, clonidine  0.2 mg twice daily, lorazepam  1 mg every 8 hours as needed.  Trazodone  50 mg tablets 1-2 nightly as needed insomnia nortriptyline  reduced to 50 mg HS early November. Increased pramipexole  to 0.5 mg  to TID  off label for treatment resistant depression  Started Auvelity  1 AM & PM mid Feb. Still generally doing well with meds and Spravato  except when got tooth abscess.  Freels more depressed after dental surgery.   No SE with meds.   Sleep is OK.  No med changes desired  03/11/23 appt noted: Received Spravato  84 mg today as scheduled.  Tolerated it well without nausea or  vomiting headache or chest pain or palpitations.  Expected dissociation gradually resolved over the 2 hour observation period. Continues meds Adderall XR 20 mg every morning, stopped Wellbutrin  XL 150 every morning, clonidine  0.2 mg twice daily, lorazepam  1 mg every 8 hours as needed.  Trazodone  50 mg tablets 1-2 nightly as needed insomnia nortriptyline  reduced to 50 mg HS early November. Increased pramipexole  to 0.5 mg  to TID  off label for treatment resistant depression  Started Auvelity  1 AM & PM mid Feb. Some increase depression with infection and needing amoxiciliin this week.  Otherwie still doing well with mood and meds. No med changes desired or indicated. No SE  03/21/2023 appointment noted: Continues meds Adderall XR 20 mg every morning, stopped Wellbutrin  XL 150 every morning, clonidine  0.2 mg twice daily, lorazepam  1 mg every 8 hours as needed.  Trazodone  50 mg tablets 1-2 nightly as needed insomnia nortriptyline  reduced to 50 mg HS early November. Increased pramipexole  to 0.5 mg  to TID  off label for treatment resistant depression  Started Auvelity  1 AM & PM mid Feb. Received Spravato  84 mg today as scheduled.  Tolerated it well without nausea or vomiting headache or chest pain or palpitations.  Expected dissociation gradually resolved  over the 2 hour observation period. Depression is much better.  Pretty much resolved this week.  She feels the Auvelity  and pramipexole  have made the biggest difference.  Obviously the Spravato  is also helping significantly.  She wants to continue all of these medications.  She still has anxiety easily.  Especially facing something that she would rather avoid.  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 Tolerating meds without significant side effects. Plan no med changes  03/29/23 appt noted: Continues meds Adderall XR 20 mg every morning, stopped Wellbutrin  XL 150 every  morning, clonidine  0.2 mg twice daily, lorazepam  1 mg every 8 hours as needed.  Trazodone  50 mg tablets 1-2 nightly as needed insomnia, nortriptyline  50 mg HS,  pramipexole  0.5 mg  to TID  off label for treatment resistant depression  Started Auvelity  1 AM & PM mid Feb 2024 No SE Received Spravato  84 mg today as scheduled.  Tolerated it well without nausea or vomiting headache or chest pain or palpitations.  Expected dissociation gradually resolved over the 2 hour observation period. Depression continues to improve to a point of very mild sx.  Still hasn't gotten her confidence fully back.  She does enjoy social interactions and is productive at work. She is tolerating the meds well without side effects.  We discussed the possibility of reducing some of the medication given the marked response which was positive with pramipexole .  04/04/23 appt noted: Continues meds Adderall XR 20 mg every morning, stopped Wellbutrin  XL 150 every morning, clonidine  0.2 mg twice daily, lorazepam  1 mg every 8 hours as needed.  Trazodone  50 mg tablets 1-2 nightly as needed insomnia, nortriptyline  50 mg HS,  pramipexole  0.5 mg  to TID  off label for treatment resistant depression  Started Auvelity  1 AM & PM mid Feb 2024 No SE Received Spravato  84 mg today as scheduled.  Tolerated it well without nausea or vomiting headache or chest pain or palpitations.  Expected dissociation gradually resolved over the 2 hour observation period. Dep is still much improved with increase in pramipexole .  Largely resolved.  Still gets anxious but takes less lorazepam  prn but still needs it.  Sleep ok.  Tolerating meds.  04/10/23 appt noted: Continues meds Adderall XR 20 mg every morning, clonidine  0.2 mg twice daily, lorazepam  1 mg every 8 hours as needed.  Trazodone  50 mg tablets 1-2 nightly as needed insomnia, nortriptyline  50 mg HS,  pramipexole  0.5 mg  to TID  off label for treatment resistant depression  Started Auvelity  1 AM & PM mid  Feb 2024 No SE.  Satisfied with meds.   Received Spravato  84 mg today as scheduled.  Tolerated it well without nausea or vomiting headache or chest pain or palpitations.  Expected dissociation gradually resolved over the 2 hour observation period. Dep is still much improved with increase in pramipexole .  Largely resolved.  Still gets anxious but takes less lorazepam  prn but still needs it.  Sleep ok.  Tolerating meds. Did an art show in another town; I couldn't have done this a year ago.    05/07/23 appt noted: Continues meds Adderall XR 20 mg every morning, clonidine  0.2 mg twice daily, lorazepam  1 mg every 8 hours as needed.  Trazodone  50 mg tablets 1-2 nightly as needed insomnia, nortriptyline  25-50 mg HS,  pramipexole  0.5 mg  to TID  off label for treatment resistant depression  Started Auvelity   1 AM & PM mid Feb 2024 No SE.  Satisfied with meds.   Received Spravato  84 mg today as scheduled.  Tolerated it well without nausea or vomiting headache or chest pain or palpitations.  Expected dissociation gradually resolved over the 2 hour observation period. H says she is her old self.  Dep so much better.  Painting going well.  Anxiety in AM and then better.  Wakes with it.  Structured days more.  Negative self talk gone.  Wonderful to have relief.  Good weekend at a wedding.  Enjoys GS; Wolfie.  More outspoken since the dep but not over the top.   More interest overall and satisfied wth resp.  Agrees to wean nortriptyline  but doesn't want other med changes.  05/15/23 received Spravato  84  05/27/23 appt noted: Continues meds Adderall XR 20 mg every morning, clonidine  0.2 mg twice daily, lorazepam  1 mg every 8 hours as needed.  Trazodone  50 mg tablets 1-2 nightly as needed insomnia, pramipexole  0.5 mg  to TID  off label for treatment resistant depression  Started Auvelity  1 AM & PM mid Feb 2024 No SE.  Satisfied with meds.   Received Spravato  84 mg today as scheduled.  Tolerated it well without nausea  or vomiting headache or chest pain or palpitations.  Expected dissociation gradually resolved over the 2 hour observation period. Maybe a little more anxious re: weaning off nortriptyline .  Busy summer.  Extremely prolific painting.   Has a pending show and stressed over preparing.   Enjoys [employer].  Kept him yesterday for a couple of hours.   Dep has been like a cloak with physical sensation for a long time but is much better than it was.  Still not 100% all the time.  Struggles with some chronic guilt issues.  Self talk and CBT on herself yesterday had some. Still some fear about travelling and demands but so much better.   Making people laugh again and socially engaged and H agrees so much better. M has been impossible.  WC and immobile.  She wastes money.   Plan no med changes  06/03/23 appt noted: Meds as above.  No SE.  Satisfied with meds.   Received Spravato  84 mg today as scheduled.  Tolerated it well without nausea or vomiting headache or chest pain or palpitations.  Expected dissociation gradually resolved over the 2 hour observation period. Overall she feels better with Spravato .  She is minimally depressed generally when she is alone and not active but she is staying active socially and with her painting.  This is really helped with her depression and she is having more success.  She still has residual anxiety which she feels she needs to take lorazepam  during the day.  She is attempting to minimize it but without much success.  She wants to continue the Spravato  because of the dramatic improvement she is seen with this and the medications.  06/11/23 appt noted: Meds as above.  No SE.  Satisfied with meds.   Received Spravato  84 mg today as scheduled.  Tolerated it well without nausea or vomiting headache or chest pain or palpitations.  Expected dissociation gradually resolved over the 2 hour observation period. Still has some anxiety wihtout reason an benefit lorazepam  and usually  only 1 mg daily.  Makes her sleepy if takes it in the day. Sees sig benefit with pramipexole  0.5 mg TID and consistent with it. Mood is still good overall.  But bc mother still can worry  about getting older.  Feels too scared about it but working on it. No concerns with meds.    06/20/23 appt noted:  Continues meds Adderall XR 20 mg every morning, clonidine  0.2 mg twice daily, lorazepam  1 mg every 8 hours as needed.  Trazodone  50 mg tablets 1-2 nightly as needed insomnia, pramipexole  0.5 mg  to TID  off label for treatment resistant depression  Started Auvelity  1 AM & PM mid Feb 2024 No SE.  Satisfied with meds.  Needs each as written. Received Spravato  84 mg today as scheduled.  Tolerated it well without nausea or vomiting headache or chest pain or palpitations.  Expected dissociation gradually resolved over the 2 hour observation period. Marked benefit Spravato  and meds remain.  No manic sx.  Residual anxiety is gradually improving.  Sleep is ok. Some residual anxiey at time.  Enjoys GS and family and functioning fine. Plan no med changes  06/25/23 appt noted: Continues meds Adderall XR 20 mg every morning, clonidine  0.2 mg twice daily, lorazepam  1 mg every 8 hours as needed.  Trazodone  50 mg tablets 1-2 nightly as needed insomnia, pramipexole  0.5 mg  to TID  off label for treatment resistant depression  Started Auvelity  1 AM & PM mid Feb 2024 No SE.  Satisfied with meds.  Needs each as written. Received Spravato  84 mg today as scheduled.  Tolerated it well without nausea or vomiting headache or chest pain or palpitations.  Expected dissociation gradually resolved over the 2 hour observation period. Marked benefit Spravato  and meds remain.  Minimal depression at this time. No manic sx.  Residual anxiety is gradually improving and she is using much less lorazepam .  Sleep is ok. Some residual anxiey at time.  Enjoys GS and family and functioning fine. Plan no med changes  07/04/23 appt  noted: Continues meds Adderall XR 20 mg every morning, clonidine  0.2 mg twice daily, lorazepam  1 mg every 8 hours as needed.  Trazodone  50 mg tablets 1-2 nightly as needed insomnia, pramipexole  0.5 mg  to TID  off label for treatment resistant depression  Started Auvelity  1 AM & PM mid Feb 2024 No SE.  Satisfied with meds.  Needs each as written. Received Spravato  84 mg today as scheduled.  Tolerated it well without nausea or vomiting headache or chest pain or palpitations.  Expected dissociation gradually resolved over the 2 hour observation period.  Good experience today. Dealing with stress Of having to place her into a nursing facility.  However she has handled it well.  Patient has been able to take over responsibilities in the family that she could not have done prior to Spravato  and current meds.  Her depression is in remission.  She she is not highly anxious usually but still needs lorazepam  in the evening.  No problems or concerns with meds.  ADD is well managed with the Adderall.  She is productive and enjoying life including working family.  07/11/23 appt noted: Continues meds Adderall XR 20 mg every morning, clonidine  0.2 mg twice daily, lorazepam  1 mg every 8 hours as needed.  Trazodone  50 mg tablets 1-2 nightly as needed insomnia, pramipexole  0.5 mg  to TID  off label for treatment resistant depression  Started Auvelity  1 AM & PM mid Feb 2024 No SE.  Satisfied with meds.  Needs each as written. Received Spravato  84 mg today as scheduled.  Tolerated it well without nausea or vomiting headache or chest pain or palpitations.  Expected dissociation gradually resolved over the  2 hour observation period.  Good experience today. Depression remained under control with remission.  She is able to enjoy things normally that she could not enjoy last year.  She is able to handle stressors that she could not handle last year such as dealing with her mother's declining health and the family issues that  that brings up.  That has created more anxiety and she needs the lorazepam  lately.  She had been avoiding it for the most part until these issues.  No changes desired.  09/02/23 appt noted:  no Spravato  Misses Auvelity  if doesn't take it but $450/month.  Will look into cost solutions.  On MCR now. Garrel Retired.  Helping with grandchild. Done art shows out of town.   Couldn't have done it a year ago. Chronic disorganization.   H retired.  Big lifestyle change.  H hanging out with .  10/24/23 appt noted: Continues meds Adderall XR 20 mg every morning, clonidine  0.2 mg twice daily, lorazepam  1 mg every 8 hours as needed, not used lately.  Trazodone  50 mg tablets 1-2 nightly as needed insomnia, pramipexole  0.5 mg  to TID  off label for treatment resistant depression  Started Auvelity  1 AM & PM mid Feb 2024.  Not taking bc cost with insurance >$400 Overall doing pretty well.  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  Also dealing with high maintenance mother in asst living.  Sister single and hard to deal with somewhat.   Still doing well overall with depression.  Anxiey is ok.   Benefit with ADD med.   Dep is still under control.  Seems very removed from her now.  Function is very good.   Enjoys GS 7 mos No issues with meds.   07/08/24 appt noted:  2 NS since here Med: Adderall XR 20 mg every morning, clonidine  0.2 mg twice daily, lorazepam  1 mg every 8 hours as needed, not used lately.  Trazodone  50 mg tablets 1-2 nightly as needed insomnia, pramipexole  0.5 mg  to TID  off label for treatment resistant depression  Started Auvelity  1 AM & PM mid Feb 2024.  Not taking bc cost with insurance >$400 Rarelorazepam .   D getting married.  Really busy.  Doing fine.  Stress up a lot.  BP fine.  Not sleeping quite as well.    Giving engagement party at house so really busy.  Getting house ready for this in Sept.   Busy with art too.   Garrel retired is good.   Loves the pramipexpole.  Kids doing well.    Walking 5 times weekly. Working daily keeps anxiety better.  Never worked this consistently.   No manic sx.  Managing social life well.   10/22/24 appt noted:  Med: Adderall XR 20 mg every morning, clonidine  0.2 mg twice daily, lorazepam  1 mg every 8 hours as needed, not used lately.  Trazodone  50 mg tablets 1-2 nightly as needed insomnia, pramipexole  0.5 mg  to TID  off label for treatment resistant depression  No SE.  Unless takes pramipexole  too late then hard to go to sleep.   Problems getting Adderall on time bc [facility] says they are out.  Out of Adderall for 5 days  and that makes her a little down.  Sometimes can feel the dep in the background.   A lot going on with M and helping with gkids.  Both kids need her.  Youngest D getting married 9/26.  Xmas is ADD NM.  Some dep in background but not interfering.   Some anxiety but not severe.  Otherwise ok off clonidine .   Work was going really well and nice shows but has slowed a little and anxious with thinks she needs to do.       ECT-MADRS    Flowsheet Row Clinical Support from 07/11/2023 in [facility] Clinical Support from 01/08/2023 in [facility] Clinical Support from 08/06/2022 in [facility] Clinical Support from 07/04/2022 in [facility] Clinical Support from 05/21/2022 in [facility]  MADRS Total Score 9 21 29 15 27    Past Psychiatric Medication Trials: fluoxetine , duloxetine, Viibryd, Pristiq, sertraline, citalopram,  Trintellix anxious and SI Wellbutrin  XL 450 Auvelity  1 dose nortriptyline  75 mg nightly for about 3 weeks and DT level 176, reduced to 50 mg HS NR. Pramipexole  1 mg NR Adderall, Adderall XR, Vyvanse, Ritalin, Strattera low dose NR Lorazepam  Trazodone   Depakote,  lamotrigine cog complaints Lithium remotely Abilify  7.5  Vraylar 1.5 mg daily agitation and  insomnia Rexulti insomnia Latuda  40 one dose, CO anxious and SI Seroquel  XR 300 Olanzapine  10  At visit November 12, 2019. We discussed Patient developed an increasingly severe alcohol dependence problem since her last visit in January.  She went to [facility] and has had no alcohol abuse since .  She never abused stimulants but they took her off the stimulants at [facility].  Her ADD was markedly worse.  The Wellbutrin  did not help the ADD.   D history lamotrigine rash at 68 yo  Review of Systems:  Review of Systems  Musculoskeletal:  Positive for back pain. Negative for arthralgias.       SP hip surgery October 2020  Neurological:  Negative for tremors.  Psychiatric/Behavioral:  Negative for agitation, behavioral problems, confusion, decreased concentration, dysphoric mood, hallucinations, self-injury, sleep disturbance and suicidal ideas. The patient is nervous/anxious. The patient is not hyperactive.        Forgetful at times about med recommendations but better now    Medications: I have reviewed the patient's current medications.  Current Outpatient Medications  Medication Sig Dispense Refill   amLODipine (NORVASC) 2.5 MG tablet Take 2.5 mg by mouth daily.     amphetamine -dextroamphetamine (ADDERALL XR) 20 MG 24 hr capsule Take 1 capsule (20 mg total) by mouth every morning. 90 capsule 0   amphetamine -dextroamphetamine (ADDERALL XR) 20 MG 24 hr capsule Take 1 capsule (20 mg total) by mouth every morning. 30 capsule 0   iron  polysaccharides (NIFEREX) 150 MG capsule TAKE 1 CAPSULE BY MOUTH EVERY DAY 90 capsule 1   LORazepam  (ATIVAN ) 1 MG tablet Take 1 tablet (1 mg total) by mouth every 8 (eight) hours as needed. for anxiety 45 tablet 0   losartan (COZAAR) 50 MG tablet Take 50 mg by mouth daily.     nebivolol (BYSTOLIC) 2.5 MG tablet Take 2.5 mg by mouth daily.     traZODone  (DESYREL ) 50 MG tablet TAKE 1-2 TABLETS BY MOUTH NIGHTLY AS NEEDED FOR SLEEP. 60 tablet 0    amphetamine -dextroamphetamine (ADDERALL XR) 20 MG 24 hr capsule Take 1 capsule (20 mg total) by mouth every morning. 90 capsule 0   buPROPion  ER (WELLBUTRIN  SR) 100 MG 12 hr tablet Take 1 tablet (100 mg total) by mouth 2 (two) times daily. 180 tablet 1   cloNIDine  (CATAPRES ) 0.2 MG tablet TAKE 1 TABLET BY MOUTH TWICE A DAY (Patient not taking:  Reported on 10/22/2024) 60 tablet 0   pramipexole  (MIRAPEX ) 1 MG tablet Take 1 tablet (1 mg total) by mouth 2 (two) times daily. 180 tablet 0   No current facility-administered medications for this visit.    Medication Side Effects: None  Allergies:  Allergies  Allergen Reactions   Metronidazole Shortness Of Breath and Other (See Comments)    Heart pounding   Ferrlecit  [Na Ferric Gluc Cplx In Sucrose] Other (See Comments)    Infusion reaction 05/12/2019    Past Medical History:  Diagnosis Date   ADHD    Anemia    Anxiety    Arthritis    Depression    Heart murmur    i went to see a cardiologit slast eyar  and i had zero plaque,    PONV (postoperative nausea and vomiting)    Recovering alcoholic in remission (HCC)     Family History  Problem Relation Age of Onset   Atrial fibrillation Mother    CAD Father     Past Medical History, Surgical history, Social history, and Family history were reviewed and updated as appropriate.   Please see review of systems for further details on the patient's review from today.   Objective:   Physical Exam:  There were no vitals taken for this visit.  Physical Exam Constitutional:      General: She is not in acute distress. Neurological:     Mental Status: She is alert and oriented to person, place, and time.     Coordination: Coordination normal.     Gait: Gait normal.  Psychiatric:        Attention and Perception: Attention and perception normal.        Mood and Affect: Mood is not anxious or depressed. Affect is not tearful.        Speech: Speech is not rapid and  pressured.        Behavior: Behavior is not hyperactive.        Thought Content: Thought content is not paranoid or delusional. Thought content does not include homicidal or suicidal ideation. Thought content does not include suicidal plan.        Cognition and Memory: Cognition normal. Memory is not impaired.        Judgment: Judgment normal.     Comments: Insight intact. No auditory or visual hallucinations. No delusions.  No depression at this time and very positive affect and expressive normally for her. No manic sx Bright affect     Lab Review:     Component Value Date/Time   NA 137 01/12/2021 1430   NA 140 11/18/2018 1544   K 3.8 01/12/2021 1430   CL 108 01/12/2021 1430   CO2 22 01/12/2021 1430   GLUCOSE 94 01/12/2021 1430   BUN 14 01/12/2021 1430   BUN 20 11/18/2018 1544   CREATININE 0.82 01/12/2021 1430   CALCIUM 8.9 01/12/2021 1430   PROT 6.6 01/12/2021 1430   ALBUMIN 3.9 01/12/2021 1430   AST 12 (L) 01/12/2021 1430   ALT 11 01/12/2021 1430   ALKPHOS 46 01/12/2021 1430   BILITOT 0.5 01/12/2021 1430   GFRNONAA >60 01/12/2021 1430   GFRAA >60 09/02/2019 0249   GFRAA >60 01/27/2019 0811       Component Value Date/Time   WBC 4.5 01/12/2021 1430   RBC 4.32 01/12/2021 1430   HGB 12.8 01/12/2021 1430   HGB 12.9 07/17/2019 0953   HCT 38.5 01/12/2021 1430   HCT 21.9 (L) 12/25/2018 1221  PLT 272 01/12/2021 1430   PLT 286 07/17/2019 0953   MCV 89.1 01/12/2021 1430   MCH 29.6 01/12/2021 1430   MCHC 33.2 01/12/2021 1430   RDW 12.4 01/12/2021 1430   LYMPHSABS 1.4 01/12/2021 1430   MONOABS 0.4 01/12/2021 1430   EOSABS 0.0 01/12/2021 1430   BASOSABS 0.0 01/12/2021 1430    No results found for: POCLITH, LITHIUM   No results found for: PHENYTOIN, PHENOBARB, VALPROATE, CBMZ   .res Assessment: Plan:    Recurrent major depression resistant to treatment - Plan: pramipexole  (MIRAPEX ) 1 MG tablet, buPROPion  ER (WELLBUTRIN  SR) 100 MG 12 hr  tablet  Attention deficit hyperactivity disorder (ADHD), predominantly inattentive type - Plan: amphetamine -dextroamphetamine (ADDERALL XR) 20 MG 24 hr capsule  Generalized anxiety disorder  Insomnia due to mental condition   She has treatment resistant major depression  with 100% better with Spravato  and meds. She has remained in remission after stopping Spravato  and increasing pramipexole  0.5 mg 3 times daily.  She had also stopped Auvelity  because insurance would not pay for it.  We discussed the risk of relapse but quite apparently her depression is in remission due to the pramipexole .  She is not taking any unusual dose of Wellbutrin  and Wellbutrin  failed to pull her out of the depression before so it is clearly the pramipexole  is making a difference.  Depression resolved recently after increasing pramipexole  0.5 mg TID, brief relapse when out of Auvelity  Consider switch to ER to reduce sleepiness if needed but better.   has failed all major categories of antidepressants MAO inhibitors which have not been tried.  Worse with less pramipexole . Increased to 0.5 mg TID on about end of Feb 2024 and markedly better Dosing range off label for depression ranges from 2-5 mg daily.  Disc risk compulsive impulsive behavior and mania.    Started Spravato  84 mg twice weekly on 03/16/2022.  Stopped bc MCR cost is too high.  Doing well.  Adderall  XR 20 mg AM  Has not taken much Ativan  in the last few months.  Using it rarely now.  Off clonidine  but wants to take clonidine  prn anxiety.   Disc difference between clonidine  and Bz for anxiety.    Discussed potential benefits, risks, and side effects of stimulants with patient to include increased heart rate, palpitations, insomnia, increased anxiety, increased irritability, or decreased appetite.  Instructed patient to contact office if experiencing any significant tolerability issues.  She continues walkly daily with benefit. And journaling.   Has  Maintained sobriety.    Continue Adderall XR 20 mg every morning  Continue lorazepam  0.5-1 mg 3 times daily s as needed but only uses rarely now.  Maximize response and increase slightly pramipexole  1 mg BID off label for dep; effective .  Disc risk mania.    Continue trazodone  50 mg tablets 1-2 nightly as needed for sleep.  FU 3 mos  Lorene Macintosh, MD, DFAPA     Please see After Visit Summary for patient specific instructions.  No future appointments.                         No orders of the defined types were placed in this encounter.      -------------------------------

## 2024-11-26 ENCOUNTER — Telehealth: Payer: Self-pay | Admitting: Psychiatry

## 2024-11-26 ENCOUNTER — Other Ambulatory Visit: Payer: Self-pay

## 2024-11-26 ENCOUNTER — Other Ambulatory Visit (HOSPITAL_COMMUNITY): Payer: Self-pay

## 2024-11-26 ENCOUNTER — Encounter: Payer: Self-pay | Admitting: Hematology

## 2024-11-26 DIAGNOSIS — F900 Attention-deficit hyperactivity disorder, predominantly inattentive type: Secondary | ICD-10-CM

## 2024-11-26 MED ORDER — AMPHETAMINE-DEXTROAMPHET ER 20 MG PO CP24
20.0000 mg | ORAL_CAPSULE | ORAL | 0 refills | Status: AC
  Filled 2024-11-26: qty 90, 90d supply, fill #0

## 2024-11-26 NOTE — Telephone Encounter (Signed)
 Pt called to report that she has been out of her adderall for about a week now due to the shortage. She is starting to feel more depressed. Can she get something as an alternative for now?

## 2024-11-26 NOTE — Telephone Encounter (Signed)
 Called [facility] on [address], which is near pt's house, and they had the Adderall in stock.  Will pend and notified pt.

## 2024-11-27 ENCOUNTER — Other Ambulatory Visit (HOSPITAL_COMMUNITY): Payer: Self-pay

## 2025-01-25 ENCOUNTER — Ambulatory Visit: Admitting: Psychiatry
